# Patient Record
Sex: Male | Born: 1945
Health system: Southern US, Community
[De-identification: ages and names within clinical notes are randomized; demographics above are authoritative.]

## PROBLEM LIST (undated history)

## (undated) DIAGNOSIS — Z9289 Personal history of other medical treatment: Secondary | ICD-10-CM

## (undated) DIAGNOSIS — G4733 Obstructive sleep apnea (adult) (pediatric): Secondary | ICD-10-CM

## (undated) DIAGNOSIS — F329 Major depressive disorder, single episode, unspecified: Secondary | ICD-10-CM

## (undated) DIAGNOSIS — H409 Unspecified glaucoma: Secondary | ICD-10-CM

## (undated) DIAGNOSIS — N200 Calculus of kidney: Secondary | ICD-10-CM

## (undated) DIAGNOSIS — J189 Pneumonia, unspecified organism: Secondary | ICD-10-CM

## (undated) DIAGNOSIS — K625 Hemorrhage of anus and rectum: Secondary | ICD-10-CM

## (undated) DIAGNOSIS — F32A Depression, unspecified: Secondary | ICD-10-CM

## (undated) DIAGNOSIS — W19XXXA Unspecified fall, initial encounter: Secondary | ICD-10-CM

## (undated) DIAGNOSIS — F0390 Unspecified dementia without behavioral disturbance: Secondary | ICD-10-CM

## (undated) DIAGNOSIS — Z993 Dependence on wheelchair: Secondary | ICD-10-CM

## (undated) DIAGNOSIS — R296 Repeated falls: Secondary | ICD-10-CM

## (undated) DIAGNOSIS — J45909 Unspecified asthma, uncomplicated: Secondary | ICD-10-CM

## (undated) DIAGNOSIS — I2510 Atherosclerotic heart disease of native coronary artery without angina pectoris: Secondary | ICD-10-CM

## (undated) DIAGNOSIS — Z8701 Personal history of pneumonia (recurrent): Secondary | ICD-10-CM

## (undated) DIAGNOSIS — R413 Other amnesia: Secondary | ICD-10-CM

## (undated) DIAGNOSIS — E785 Hyperlipidemia, unspecified: Secondary | ICD-10-CM

## (undated) DIAGNOSIS — M549 Dorsalgia, unspecified: Secondary | ICD-10-CM

## (undated) DIAGNOSIS — Z87442 Personal history of urinary calculi: Secondary | ICD-10-CM

## (undated) DIAGNOSIS — E538 Deficiency of other specified B group vitamins: Secondary | ICD-10-CM

## (undated) DIAGNOSIS — C61 Malignant neoplasm of prostate: Secondary | ICD-10-CM

## (undated) DIAGNOSIS — Z8679 Personal history of other diseases of the circulatory system: Secondary | ICD-10-CM

## (undated) DIAGNOSIS — M1990 Unspecified osteoarthritis, unspecified site: Secondary | ICD-10-CM

## (undated) DIAGNOSIS — K219 Gastro-esophageal reflux disease without esophagitis: Secondary | ICD-10-CM

## (undated) DIAGNOSIS — G8929 Other chronic pain: Secondary | ICD-10-CM

## (undated) DIAGNOSIS — F4310 Post-traumatic stress disorder, unspecified: Secondary | ICD-10-CM

## (undated) DIAGNOSIS — I10 Essential (primary) hypertension: Secondary | ICD-10-CM

## (undated) DIAGNOSIS — R51 Headache: Secondary | ICD-10-CM

## (undated) HISTORY — DX: Calculus of kidney: N20.0

## (undated) HISTORY — DX: Unspecified asthma, uncomplicated: J45.909

## (undated) HISTORY — PX: FRACTURE SURGERY: SHX138

## (undated) HISTORY — PX: SHOULDER SURGERY: SHX246

## (undated) HISTORY — DX: Hyperlipidemia, unspecified: E78.5

## (undated) HISTORY — PX: BILATERAL KNEE ARTHROSCOPY: SUR91

## (undated) HISTORY — DX: Unspecified fall, initial encounter: W19.XXXA

## (undated) HISTORY — DX: Deficiency of other specified B group vitamins: E53.8

## (undated) HISTORY — DX: Repeated falls: R29.6

## (undated) HISTORY — DX: Hemorrhage of anus and rectum: K62.5

## (undated) HISTORY — DX: Atherosclerotic heart disease of native coronary artery without angina pectoris: I25.10

## (undated) HISTORY — DX: Obstructive sleep apnea (adult) (pediatric): G47.33

## (undated) HISTORY — DX: Post-traumatic stress disorder, unspecified: F43.10

## (undated) HISTORY — DX: Other amnesia: R41.3

## (undated) HISTORY — DX: Personal history of pneumonia (recurrent): Z87.01

## (undated) HISTORY — DX: Headache: R51

## (undated) HISTORY — PX: FACIAL COSMETIC SURGERY: SHX629

## (undated) HISTORY — PX: OTHER SURGICAL HISTORY: SHX169

## (undated) HISTORY — DX: Essential (primary) hypertension: I10

## (undated) HISTORY — DX: Personal history of other diseases of the circulatory system: Z86.79

---

## 1998-04-25 ENCOUNTER — Ambulatory Visit (HOSPITAL_COMMUNITY): Admission: RE | Admit: 1998-04-25 | Discharge: 1998-04-25 | Payer: Self-pay | Admitting: *Deleted

## 1998-06-09 ENCOUNTER — Ambulatory Visit (HOSPITAL_COMMUNITY): Admission: RE | Admit: 1998-06-09 | Discharge: 1998-06-09 | Payer: Self-pay | Admitting: Neurological Surgery

## 1998-06-10 ENCOUNTER — Inpatient Hospital Stay (HOSPITAL_COMMUNITY): Admission: RE | Admit: 1998-06-10 | Discharge: 1998-06-13 | Payer: Self-pay | Admitting: Neurological Surgery

## 1999-06-01 ENCOUNTER — Ambulatory Visit (HOSPITAL_BASED_OUTPATIENT_CLINIC_OR_DEPARTMENT_OTHER): Admission: RE | Admit: 1999-06-01 | Discharge: 1999-06-01 | Payer: Self-pay | Admitting: Orthopedic Surgery

## 2000-09-16 ENCOUNTER — Encounter: Admission: RE | Admit: 2000-09-16 | Discharge: 2000-09-16 | Payer: Self-pay | Admitting: Neurological Surgery

## 2000-09-16 ENCOUNTER — Encounter: Payer: Self-pay | Admitting: Neurological Surgery

## 2000-09-30 ENCOUNTER — Ambulatory Visit (HOSPITAL_COMMUNITY): Admission: RE | Admit: 2000-09-30 | Discharge: 2000-09-30 | Payer: Self-pay | Admitting: Neurological Surgery

## 2000-09-30 ENCOUNTER — Encounter: Payer: Self-pay | Admitting: Neurological Surgery

## 2000-11-23 ENCOUNTER — Encounter: Admission: RE | Admit: 2000-11-23 | Discharge: 2000-11-23 | Payer: Self-pay | Admitting: Neurological Surgery

## 2000-11-23 ENCOUNTER — Encounter: Payer: Self-pay | Admitting: Neurological Surgery

## 2001-04-23 ENCOUNTER — Ambulatory Visit (HOSPITAL_COMMUNITY): Admission: RE | Admit: 2001-04-23 | Discharge: 2001-04-23 | Payer: Self-pay | Admitting: Neurological Surgery

## 2001-04-23 ENCOUNTER — Encounter: Payer: Self-pay | Admitting: Neurological Surgery

## 2001-06-19 ENCOUNTER — Emergency Department (HOSPITAL_COMMUNITY): Admission: EM | Admit: 2001-06-19 | Discharge: 2001-06-19 | Payer: Self-pay | Admitting: Emergency Medicine

## 2001-06-19 ENCOUNTER — Encounter: Payer: Self-pay | Admitting: Emergency Medicine

## 2001-06-30 ENCOUNTER — Ambulatory Visit: Admission: RE | Admit: 2001-06-30 | Discharge: 2001-06-30 | Payer: Self-pay | Admitting: Otolaryngology

## 2001-11-30 ENCOUNTER — Ambulatory Visit (HOSPITAL_COMMUNITY): Admission: RE | Admit: 2001-11-30 | Discharge: 2001-11-30 | Payer: Self-pay | Admitting: Internal Medicine

## 2001-12-19 ENCOUNTER — Encounter: Payer: Self-pay | Admitting: Internal Medicine

## 2001-12-19 ENCOUNTER — Ambulatory Visit (HOSPITAL_COMMUNITY): Admission: RE | Admit: 2001-12-19 | Discharge: 2001-12-19 | Payer: Self-pay | Admitting: Internal Medicine

## 2002-06-21 ENCOUNTER — Ambulatory Visit (HOSPITAL_COMMUNITY): Admission: RE | Admit: 2002-06-21 | Discharge: 2002-06-21 | Payer: Self-pay | Admitting: Internal Medicine

## 2002-06-21 HISTORY — PX: ESOPHAGOGASTRODUODENOSCOPY: SHX1529

## 2003-02-27 ENCOUNTER — Encounter: Payer: Self-pay | Admitting: Neurological Surgery

## 2003-02-27 ENCOUNTER — Ambulatory Visit (HOSPITAL_COMMUNITY): Admission: RE | Admit: 2003-02-27 | Discharge: 2003-02-27 | Payer: Self-pay | Admitting: Neurological Surgery

## 2003-03-01 ENCOUNTER — Ambulatory Visit (HOSPITAL_COMMUNITY): Admission: RE | Admit: 2003-03-01 | Discharge: 2003-03-02 | Payer: Self-pay | Admitting: Neurological Surgery

## 2003-03-01 ENCOUNTER — Encounter: Payer: Self-pay | Admitting: Neurological Surgery

## 2003-04-18 ENCOUNTER — Ambulatory Visit (HOSPITAL_COMMUNITY): Admission: RE | Admit: 2003-04-18 | Discharge: 2003-04-18 | Payer: Self-pay | Admitting: Neurological Surgery

## 2003-04-18 ENCOUNTER — Encounter: Payer: Self-pay | Admitting: Neurological Surgery

## 2003-10-21 ENCOUNTER — Other Ambulatory Visit: Admission: RE | Admit: 2003-10-21 | Discharge: 2003-10-21 | Payer: Self-pay | Admitting: Dermatology

## 2003-12-17 ENCOUNTER — Ambulatory Visit (HOSPITAL_COMMUNITY): Admission: RE | Admit: 2003-12-17 | Discharge: 2003-12-17 | Payer: Self-pay | Admitting: Internal Medicine

## 2003-12-17 HISTORY — PX: COLONOSCOPY: SHX174

## 2004-09-08 ENCOUNTER — Ambulatory Visit (HOSPITAL_COMMUNITY): Admission: RE | Admit: 2004-09-08 | Discharge: 2004-09-08 | Payer: Self-pay | Admitting: Internal Medicine

## 2004-09-09 ENCOUNTER — Encounter (HOSPITAL_COMMUNITY): Admission: RE | Admit: 2004-09-09 | Discharge: 2004-09-10 | Payer: Self-pay | Admitting: Internal Medicine

## 2005-05-19 ENCOUNTER — Ambulatory Visit: Payer: Self-pay | Admitting: Cardiology

## 2005-05-19 ENCOUNTER — Encounter: Payer: Self-pay | Admitting: Emergency Medicine

## 2005-05-19 ENCOUNTER — Inpatient Hospital Stay (HOSPITAL_COMMUNITY): Admission: AD | Admit: 2005-05-19 | Discharge: 2005-05-20 | Payer: Self-pay | Admitting: Cardiology

## 2005-05-25 ENCOUNTER — Ambulatory Visit: Payer: Self-pay | Admitting: Cardiology

## 2005-05-25 ENCOUNTER — Ambulatory Visit: Payer: Self-pay

## 2005-09-20 ENCOUNTER — Ambulatory Visit (HOSPITAL_COMMUNITY): Admission: RE | Admit: 2005-09-20 | Discharge: 2005-09-20 | Payer: Self-pay | Admitting: Internal Medicine

## 2005-09-22 ENCOUNTER — Ambulatory Visit: Payer: Self-pay | Admitting: Cardiology

## 2005-10-19 ENCOUNTER — Ambulatory Visit (HOSPITAL_COMMUNITY): Admission: RE | Admit: 2005-10-19 | Discharge: 2005-10-19 | Payer: Self-pay | Admitting: Internal Medicine

## 2005-11-10 ENCOUNTER — Ambulatory Visit (HOSPITAL_COMMUNITY): Admission: RE | Admit: 2005-11-10 | Discharge: 2005-11-10 | Payer: Self-pay | Admitting: Otolaryngology

## 2005-12-03 ENCOUNTER — Ambulatory Visit (HOSPITAL_COMMUNITY): Admission: RE | Admit: 2005-12-03 | Discharge: 2005-12-03 | Payer: Self-pay | Admitting: Otolaryngology

## 2005-12-03 ENCOUNTER — Encounter (INDEPENDENT_AMBULATORY_CARE_PROVIDER_SITE_OTHER): Payer: Self-pay | Admitting: *Deleted

## 2006-05-19 ENCOUNTER — Ambulatory Visit (HOSPITAL_COMMUNITY): Admission: RE | Admit: 2006-05-19 | Discharge: 2006-05-19 | Payer: Self-pay | Admitting: Internal Medicine

## 2006-07-29 ENCOUNTER — Ambulatory Visit (HOSPITAL_COMMUNITY): Admission: RE | Admit: 2006-07-29 | Discharge: 2006-07-29 | Payer: Self-pay | Admitting: Neurological Surgery

## 2006-09-13 ENCOUNTER — Inpatient Hospital Stay (HOSPITAL_COMMUNITY): Admission: RE | Admit: 2006-09-13 | Discharge: 2006-09-15 | Payer: Self-pay | Admitting: Neurological Surgery

## 2006-09-17 ENCOUNTER — Emergency Department (HOSPITAL_COMMUNITY): Admission: EM | Admit: 2006-09-17 | Discharge: 2006-09-17 | Payer: Self-pay | Admitting: Emergency Medicine

## 2006-11-12 ENCOUNTER — Emergency Department (HOSPITAL_COMMUNITY): Admission: EM | Admit: 2006-11-12 | Discharge: 2006-11-12 | Payer: Self-pay | Admitting: Emergency Medicine

## 2006-12-08 ENCOUNTER — Ambulatory Visit (HOSPITAL_COMMUNITY): Admission: RE | Admit: 2006-12-08 | Discharge: 2006-12-08 | Payer: Self-pay | Admitting: Internal Medicine

## 2006-12-19 ENCOUNTER — Ambulatory Visit: Payer: Self-pay | Admitting: Cardiology

## 2006-12-19 ENCOUNTER — Observation Stay (HOSPITAL_COMMUNITY): Admission: EM | Admit: 2006-12-19 | Discharge: 2006-12-20 | Payer: Self-pay | Admitting: Emergency Medicine

## 2006-12-22 ENCOUNTER — Ambulatory Visit: Payer: Self-pay | Admitting: Cardiology

## 2006-12-22 ENCOUNTER — Ambulatory Visit: Payer: Self-pay

## 2006-12-22 ENCOUNTER — Encounter: Payer: Self-pay | Admitting: Cardiology

## 2007-01-09 ENCOUNTER — Ambulatory Visit: Payer: Self-pay | Admitting: Cardiology

## 2008-01-01 ENCOUNTER — Ambulatory Visit (HOSPITAL_COMMUNITY): Admission: RE | Admit: 2008-01-01 | Discharge: 2008-01-01 | Payer: Self-pay | Admitting: Neurological Surgery

## 2008-04-15 ENCOUNTER — Encounter: Admission: RE | Admit: 2008-04-15 | Discharge: 2008-04-15 | Payer: Self-pay | Admitting: Neurological Surgery

## 2008-04-29 ENCOUNTER — Ambulatory Visit: Payer: Self-pay | Admitting: Cardiology

## 2008-05-03 ENCOUNTER — Ambulatory Visit: Payer: Self-pay

## 2008-05-03 ENCOUNTER — Encounter: Payer: Self-pay | Admitting: Cardiology

## 2008-05-06 ENCOUNTER — Ambulatory Visit: Payer: Self-pay

## 2008-10-01 ENCOUNTER — Ambulatory Visit: Payer: Self-pay | Admitting: Internal Medicine

## 2008-10-15 ENCOUNTER — Ambulatory Visit: Payer: Self-pay | Admitting: Internal Medicine

## 2008-10-15 ENCOUNTER — Ambulatory Visit (HOSPITAL_COMMUNITY): Admission: RE | Admit: 2008-10-15 | Discharge: 2008-10-15 | Payer: Self-pay | Admitting: Internal Medicine

## 2008-10-15 ENCOUNTER — Encounter: Payer: Self-pay | Admitting: Internal Medicine

## 2008-10-15 HISTORY — PX: COLONOSCOPY: SHX174

## 2008-10-15 HISTORY — PX: ESOPHAGOGASTRODUODENOSCOPY: SHX1529

## 2009-01-27 ENCOUNTER — Encounter: Payer: Self-pay | Admitting: Cardiology

## 2009-02-05 ENCOUNTER — Encounter: Payer: Self-pay | Admitting: Cardiology

## 2009-08-11 DIAGNOSIS — R0609 Other forms of dyspnea: Secondary | ICD-10-CM | POA: Insufficient documentation

## 2009-08-11 DIAGNOSIS — R0989 Other specified symptoms and signs involving the circulatory and respiratory systems: Secondary | ICD-10-CM

## 2009-08-11 DIAGNOSIS — E876 Hypokalemia: Secondary | ICD-10-CM | POA: Insufficient documentation

## 2009-08-11 DIAGNOSIS — K625 Hemorrhage of anus and rectum: Secondary | ICD-10-CM | POA: Insufficient documentation

## 2009-08-12 ENCOUNTER — Ambulatory Visit: Payer: Self-pay | Admitting: Cardiology

## 2009-10-13 ENCOUNTER — Ambulatory Visit (HOSPITAL_COMMUNITY): Admission: RE | Admit: 2009-10-13 | Discharge: 2009-10-13 | Payer: Self-pay | Admitting: Internal Medicine

## 2009-10-13 ENCOUNTER — Encounter: Payer: Self-pay | Admitting: Orthopedic Surgery

## 2009-10-15 ENCOUNTER — Ambulatory Visit (HOSPITAL_COMMUNITY): Admission: RE | Admit: 2009-10-15 | Discharge: 2009-10-15 | Payer: Self-pay | Admitting: Internal Medicine

## 2009-10-31 DIAGNOSIS — F1010 Alcohol abuse, uncomplicated: Secondary | ICD-10-CM | POA: Insufficient documentation

## 2009-10-31 DIAGNOSIS — G4733 Obstructive sleep apnea (adult) (pediatric): Secondary | ICD-10-CM | POA: Insufficient documentation

## 2009-10-31 DIAGNOSIS — K219 Gastro-esophageal reflux disease without esophagitis: Secondary | ICD-10-CM | POA: Insufficient documentation

## 2009-10-31 DIAGNOSIS — I10 Essential (primary) hypertension: Secondary | ICD-10-CM | POA: Insufficient documentation

## 2009-10-31 DIAGNOSIS — E785 Hyperlipidemia, unspecified: Secondary | ICD-10-CM | POA: Insufficient documentation

## 2009-10-31 DIAGNOSIS — F17200 Nicotine dependence, unspecified, uncomplicated: Secondary | ICD-10-CM | POA: Insufficient documentation

## 2009-10-31 DIAGNOSIS — E7800 Pure hypercholesterolemia, unspecified: Secondary | ICD-10-CM | POA: Insufficient documentation

## 2009-11-14 ENCOUNTER — Ambulatory Visit (HOSPITAL_COMMUNITY): Admission: RE | Admit: 2009-11-14 | Discharge: 2009-11-14 | Payer: Self-pay | Admitting: Internal Medicine

## 2009-11-14 ENCOUNTER — Encounter: Payer: Self-pay | Admitting: Orthopedic Surgery

## 2009-12-08 ENCOUNTER — Ambulatory Visit: Payer: Self-pay | Admitting: Orthopedic Surgery

## 2009-12-08 DIAGNOSIS — M84369A Stress fracture, unspecified tibia and fibula, initial encounter for fracture: Secondary | ICD-10-CM | POA: Insufficient documentation

## 2009-12-16 ENCOUNTER — Telehealth: Payer: Self-pay | Admitting: Orthopedic Surgery

## 2009-12-17 ENCOUNTER — Encounter: Payer: Self-pay | Admitting: Orthopedic Surgery

## 2010-02-09 ENCOUNTER — Ambulatory Visit: Payer: Self-pay | Admitting: Orthopedic Surgery

## 2010-02-09 DIAGNOSIS — M1710 Unilateral primary osteoarthritis, unspecified knee: Secondary | ICD-10-CM

## 2010-02-09 DIAGNOSIS — IMO0002 Reserved for concepts with insufficient information to code with codable children: Secondary | ICD-10-CM | POA: Insufficient documentation

## 2010-03-23 ENCOUNTER — Ambulatory Visit: Payer: Self-pay | Admitting: Orthopedic Surgery

## 2010-05-04 ENCOUNTER — Telehealth: Payer: Self-pay | Admitting: Orthopedic Surgery

## 2010-07-14 ENCOUNTER — Encounter: Payer: Self-pay | Admitting: Cardiology

## 2010-07-17 DIAGNOSIS — I2510 Atherosclerotic heart disease of native coronary artery without angina pectoris: Secondary | ICD-10-CM | POA: Insufficient documentation

## 2010-07-22 ENCOUNTER — Ambulatory Visit: Payer: Self-pay | Admitting: Cardiology

## 2010-07-22 DIAGNOSIS — E663 Overweight: Secondary | ICD-10-CM | POA: Insufficient documentation

## 2010-07-22 DIAGNOSIS — R55 Syncope and collapse: Secondary | ICD-10-CM | POA: Insufficient documentation

## 2011-01-19 NOTE — Letter (Signed)
Summary: [facility]   [facility]   Imported By: Roderic Ovens 07/27/2010 16:04:55  _____________________________________________________________________  External Attachment:    Type:   Image     Comment:   External Document

## 2011-01-19 NOTE — Assessment & Plan Note (Signed)
Summary: left tib plateau contusion/xr and mri ap/fed bcbs/fagan/bsf   Visit Type:  Initial Consult Referring Provider:  Dr Ouida Sills  Primary Provider:  Carylon Perches  CC:  left knee pain.  History of Present Illness: I saw Cody Velez in the office today for an initial visit.  He is a 65 years old man with the complaint of:  LEFT knee pain x6 weeks.  Patient reports he fell at home, landed on his back started having some soreness in his LEFT knee. Then, he noticed increasing pain with his primary care physician. The knee was aspirated approximately 30 cc of blood. He eventually had an MRI and an x-ray, which showed 3 compartment arthritis and a stress reaction or stress-type appearance to the bone. The tibial plateau on the medial side. He's been using oxycodone for pain 5 mg, which he was taking for his back. It doesn't really help. His back or his knee.  He's had 5 back surgeries to a back fusion that is currently being managed with oxycodone and injections. He will need another surgery.  Right now, has severe medial knee pain, which is nonradiating, primarily over the medial side, and underneath the patella. Associated with swelling, which is returned in the last week or so. He is using a cane.  review of systems the patient was reflux, hematuria, depression, denies weight loss, chest pain, shortness of breath, nausea, headache, thyroid disease, eczema, poor vision, cataracts, seasonal allergies, lymph node disease, or sinusitis.  No major medical problems surgery on his knee. Lumbar disc surgery x5 with recent spinal, fusion, he's had some facial surgery.  Physician Dr. Ouida Sills.  Medications are recorded in the list, and have been reviewed.  Family history heart disease, cancer, kidney disease.  Social history is married. He works in Insurance account manager. He does not smoke. He drinks wine caffeine, one to 2 cups per day. I scraped completed, some college.  Physical exam see. Physical exam section       Allergies: 1)  ! * Phenylephirednirne  Physical Exam  Additional Exam:  examination vital signs are stable as recorded.  Large male in reasonable shape oriented x3. Mood and affect pleasant.  He ambulates with a cane in the RIGHT hand limping.  cardiovascular exam in the lower extremity. His pulses temperature normal, no edema.  No lymphadenopathy in the lower extremities.  Coordination balance normal.   LEFT knee. Skin is normal. Strength is normal. Stability is normal. Range of motion is relatively normal. On inspection there is a large joint effusion. His medial tenderness.  RIGHT leg. Alignment is normal. No swelling.      Impression & Recommendations:  Problem # 1:  STRESS FRACTURE, TIBIA (ICD-733.93) Assessment New  stress reaction seen on MRI is described as a stress fracture. Recommend protected weightbearing in a hinge brace with a cane.  MRI and x-rays reviewed from the hospital. X-rays of the knee show arthritis does not seem to be as bad as noted in seen on MRI.  Recommend increase oxycodone, dose to 7.5 mg q.4 p.r.n. pain. Follow up visit 8 weeks  Orders: New Patient Level III (11914)  Medications Added to Medication List This Visit: 1)  Oxycodone-acetaminophen 7.5-325 Mg Tabs (Oxycodone-acetaminophen) .Marland Kitchen.. 1 by mouth q 4 as needed pain  Patient Instructions: 1)  8 week followup. Clinical exam only  2)  wear a brace for walking Prescriptions: OXYCODONE-ACETAMINOPHEN 7.5-325 MG TABS (OXYCODONE-ACETAMINOPHEN) 1 by mouth q 4 as needed pain  #100 x 0   Entered  and Authorized by:   Fuller Canada MD   Signed by:   Fuller Canada MD on 12/08/2009   Method used:   Print then Give to Patient   RxID:   [id number]

## 2011-01-19 NOTE — Progress Notes (Signed)
Summary: call from patient : Letter requested  Phone Note Call from Patient   Caller: Patient Summary of Call: Patient called 1:25pm today to request letter for employer, [employer] of [employer], to the effect that he needs to wear brace X8 wks due to medical reasons; mention limitation re: bending knee. Initial call taken by: Cammie Sickle,  December 16, 2009 6:54 PM  Follow-up for Phone Call        Patient picked up letter 12/17/09. Follow-up by: Cammie Sickle,  December 18, 2009 8:20 AM

## 2011-01-19 NOTE — Assessment & Plan Note (Signed)
Summary: F1Y/ANAS  Medications Added FLUOXETINE HCL 20 MG CAPS (FLUOXETINE HCL) 1 cap once daily        Visit Type:  1 yr f/u Referring Provider:  Dr Ouida Sills  Primary Provider:  Carylon Perches  CC:  pt states last October he was working in the yard and had a syncope episode said he went to Dr. Alonza Smoker office and said they did not know why he passed out...pt states that he had been having back pain and had been taking some percocet he also states he was sweaty and nausea...states he hurt his left knee at that time....sob but says he has this all the time.  History of Present Illness: Mr Paternoster comes in today for evaluation and management of his coronary artery disease.  He is having no angina or ischemic symptoms. However, he did have unexplained syncope in October 2010. He had just eaten breakfast and subsequently became nauseated. He broke out in a sweat and found himself lying in the yard. He avoided injury. There were no symptoms prior to this to suggest any cardiac etiology. There's been no recurrence.  He has lost about 6 pounds. He has had a lot of problems with his back and his left knee. Dr. Danielle Dess and Dr. Ouida Sills have encouraged him to lose weight.  He has a hard time lying flat without getting back spasms.  He is compliant with medications. He says his last cholesterol with Dr. Ouida Sills was 123. His triglycerides are still elevated.  He's had some edema in his legs he denies any PND orthopnea.  Clinical Reports Reviewed:  Cardiac Cath:  05/20/2005: Cardiac Cath Findings:    IMPRESSION:  1.  Normal left ventricular systolic function.  2.  Moderate two-vessel coronary disease of borderline severity. Overall it      does not appear to be angiographically significant.   PLAN:  Would recommend a stress nuclear study, to rule out ischemia in the  distribution of the LAD or circumflex arteries.   MWP/MEDQ  D:  05/20/2005  T:  05/20/2005  Job:  [id number]   cc:   Kingsley Callander. Ouida Sills, MD  [address]  [place]  [place] [address]  Fax: [phone]  Nuclear Study:  05/03/2008:  Excerise capacity: Adenosine study with no exercise  Blood Pressure response:  Clinical symptoms:  ECG impression: No significant ST segment change suggestive of ischemia  Overall impression: There is no sign of scar or ischemia.  Olga Millers, MD   05/25/2005:  Interpretation: Clinically negative for ischemia and electrocardiographically negative for ischemia. Perfusion data demonstrated inferoseptal and anteroseptal defect, both of which were nonreversible. There was no definite ischemia seen on this study.  Learta Codding, MD   Current Medications (verified): 1)  Cozaar 100 Mg Tabs (Losartan Potassium) .Marland Kitchen.. 1 Tab Once Daily 2)  Nexium 40 Mg Cpdr (Esomeprazole Magnesium) .Marland Kitchen.. 1 Tab Once Daily 3)  Aspirin 81 Mg Tbec (Aspirin) .... Take One Tablet By Mouth Daily 4)  Crestor 5 Mg Tabs (Rosuvastatin Calcium) .Marland Kitchen.. 1 Tab At Bedtime 5)  Optivar 0.05 % Soln (Azelastine Hcl) .... Use As Directed 6)  Restasis 0.05 % Emul (Cyclosporine) .... Use As Directed 7)  Multivitamins   Tabs (Multiple Vitamin) .Marland Kitchen.. 1 Tab Once Daily 8)  Fish Oil 1000 Mg Caps (Omega-3 Fatty Acids) .Marland Kitchen.. 1 Cap Once Daily 9)  Chlorthalidone 25 Mg Tabs (Chlorthalidone) .Marland Kitchen.. 1 Tab Once Daily 10)  Coenzyme Q10 100 Mg Caps (Coenzyme Q10) .Marland Kitchen.. 1 Cap Once Daily  11)  Chlorthalidone 25 Mg Tabs (Chlorthalidone) .... Once Daily 12)  Oxycodone-Acetaminophen 7.5-325 Mg Tabs (Oxycodone-Acetaminophen) .Marland Kitchen.. 1 By Mouth Q 4 As Needed Pain 13)  Fluoxetine Hcl 20 Mg Caps (Fluoxetine Hcl) .Marland Kitchen.. 1 Cap Once Daily  Allergies: 1)  ! * Phenylephirednirne  Past History:  Past Medical History: Last updated: 07/17/2010 CAD, native vessel DYSPNEA ON EXERTION (ICD-786.09) HYPOKALEMIA (ICD-276.8) RECTAL BLEEDING (ICD-569.3)  Past Surgical History: Last updated: 04-Sep-2009 back surgeries x5 Knee Arthroscopy bilateral  facial surgery left wrist  surgery  Family History: Last updated: 09/04/2009  Mother deceased at age 57 due to perforated colon at   time of colonoscopy which is being done to evaluate metastatic cancer   possibly colon cancer per family.  Father deceased at age 59 with lung   cancer.  Mother according to the patient's prior family history had a   history of polycythemia.      Social History: Last updated: 09/04/2009  He is married, he has three daughters.  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  He quit smoking in 1970.   History of chewing tobacco.  He occasionally consumes alcohol.   Review of Systems       negative other than history of present illness  Vital Signs:  Patient profile:   65 year old male Height:      75 inches Weight:      308 pounds BMI:     38.64 Pulse rate:   69 / minute Pulse rhythm:   irregular BP sitting:   136 / 80  (left arm) Cuff size:   large  Vitals Entered By: Danielle Rankin, CMA (July 22, 2010 3:21 PM)  Physical Exam  General:  obese.  no acute distress Head:  normocephalic and atraumatic Eyes:  PERRLA/EOM intact; conjunctiva and lids normal. Neck:  Neck supple, no JVD. No masses, thyromegaly or abnormal cervical nodes. Chest Virjean Boman:  no deformities or breast masses noted Lungs:  Clear bilaterally to auscultation and percussion. Heart:  PMI nondisplaced, normal S1-S2, no murmur or gallop. Regular rate and rhythm Msk:  decreased ROM.   Pulses:  pulses normal in all 4 extremities Extremities:  No clubbing or cyanosis. 1-2+ pitting edema Neurologic:  Alert and oriented x 3. Skin:  Intact without lesions or rashes. Psych:  Normal affect.   EKG  Procedure date:  07/22/2010  Findings:      normal sinus rhythm, left axis deviation, and no acute changes.  Impression & Recommendations:  Problem # 1:  CAD, NATIVE VESSEL (ICD-414.01) Assessment Unchanged Stable. Continue medical therapy. Symptoms of angina reviewed. He really can't lay down to  have a Myoview at this time. His updated medication list for this problem includes:    Aspirin 81 Mg Tbec (Aspirin) .Marland Kitchen... Take one tablet by mouth daily  Orders: EKG w/ Interpretation (93000)  His updated medication list for this problem includes:    Aspirin 81 Mg Tbec (Aspirin) .Marland Kitchen... Take one tablet by mouth daily  Problem # 2:  HYPERCHOLESTEROLEMIA (ICD-272.0) I have asked him to avoid carbs and lose more weight to get his triglycerides down. The following medications were removed from the medication list:    Simvastatin 40 Mg Tabs (Simvastatin) ..... Once daily His updated medication list for this problem includes:    Crestor 5 Mg Tabs (Rosuvastatin calcium) .Marland Kitchen... 1 tab at bedtime  The following medications were removed from the medication list:    Simvastatin 40 Mg Tabs (Simvastatin) .Marland KitchenMarland KitchenMarland KitchenMarland Kitchen  Once daily His updated medication list for this problem includes:    Crestor 5 Mg Tabs (Rosuvastatin calcium) .Marland Kitchen... 1 tab at bedtime  Problem # 3:  HYPERTENSION (ICD-401.9)  His updated medication list for this problem includes:    Cozaar 100 Mg Tabs (Losartan potassium) .Marland Kitchen... 1 tab once daily    Aspirin 81 Mg Tbec (Aspirin) .Marland Kitchen... Take one tablet by mouth daily    Chlorthalidone 25 Mg Tabs (Chlorthalidone) .Marland Kitchen... 1 tab once daily    Chlorthalidone 25 Mg Tabs (Chlorthalidone) ..... Once daily  Orders: EKG w/ Interpretation (93000)  His updated medication list for this problem includes:    Cozaar 100 Mg Tabs (Losartan potassium) .Marland Kitchen... 1 tab once daily    Aspirin 81 Mg Tbec (Aspirin) .Marland Kitchen... Take one tablet by mouth daily    Chlorthalidone 25 Mg Tabs (Chlorthalidone) .Marland Kitchen... 1 tab once daily    Chlorthalidone 25 Mg Tabs (Chlorthalidone) ..... Once daily  Problem # 4:  OVERWEIGHT/OBESITY (ICD-278.02) Assessment: Improved  Problem # 5:  SYNCOPE AND COLLAPSE (ICD-780.2) This was probably vasovagal. No recurrence. Reviewed with patient. His updated medication list for this problem includes:     Aspirin 81 Mg Tbec (Aspirin) .Marland Kitchen... Take one tablet by mouth daily  Patient Instructions: 1)  Your physician recommends that you schedule a follow-up appointment in: 1 year with Dr. Daleen Squibb 2)  Your physician recommends that you continue on your current medications as directed. Please refer to the Current Medication list given to you today.

## 2011-01-19 NOTE — Letter (Signed)
Summary: Historic Patient File  Historic Patient File   Imported By: Elvera Maria 12/10/2009 09:28:05  _____________________________________________________________________  External Attachment:    Type:   Image     Comment:   history form

## 2011-01-19 NOTE — Assessment & Plan Note (Signed)
Summary: 6 WK RE-CK FOL'G BRACE/BCBS/CAF   Visit Type:  Follow-up Referring Provider:  Dr Ouida Sills  Primary Provider:  Carylon Perches  CC:  left knee pain.  History of Present Illness:       Patient reports he fell at home, landed on his back started having some soreness in his LEFT knee. Then, he noticed increasing pain with his primary care physician. The knee was aspirated approximately 30 cc of blood. He eventually had an MRI and an x-ray, which showed 3 compartment arthritis and a stress reaction or stress-type appearance to the bonein the  tibial plateau on the medial side.    He's had 5 back surgeries to a back fusion that is currently being managed with oxycodone and injections. He will need another surgery.  Medications: Aspirin, Oxycodone.  He pain has decreased but he still has pain over the medial joint line and had some anterior knee pain today. the knee feels like it wants to give out at times   IMAGING: 10/2009: 3 compartment OA with medial posterior tibial edema or stress reaction             Allergies: 1)  ! * Phenylephirednirne  Physical Exam  Additional Exam:     Large male in reasonable shape oriented x3. Mood and affect pleasant.  He ambulates with a cane in the RIGHT hand    cardiovascular exam in the lower extremity. His pulses temperature normal, no edema.     Coordination balance normal.   LEFT knee. Skin is normal. Strength is normal. Stability is normal. Range of motion is relatively normal.           Impression & Recommendations:  Problem # 1:  KNEE, ARTHRITIS, DEGEN./OSTEO (ICD-715.96) Assessment Improved  His updated medication list for this problem includes:    Aspirin 81 Mg Tbec (Aspirin) .Marland Kitchen... Take one tablet by mouth daily    Oxycodone-acetaminophen 7.5-325 Mg Tabs (Oxycodone-acetaminophen) .Marland Kitchen... 1 by mouth q 4 as needed pain  Orders: Est. Patient Level II (10175)  Problem # 2:  STRESS FRACTURE, TIBIA  (ICD-733.93) Assessment: Improved  Orders: Est. Patient Level II (10258)  Patient Instructions: 1)  knee exercises for 6 weeks  2)  wear brace for walking  3)  return in 6 weeks  4)  cane as needed

## 2011-01-19 NOTE — Assessment & Plan Note (Signed)
Summary: 8 WK RE-CK FOL'G BRACE/[employer]/CAF   Visit Type:  Follow-up Referring Provider:  Dr Ouida Sills  Primary Provider:  Carylon Perches  CC:  8 week recheck. pain left knee .  History of Present Illness: I saw Cody Velez in the office today for an initial visit.  He is a 65 years old man with the complaint of:  LEFT knee pain, tibia stress fracture.  Patient reports he fell at home, landed on his back started having some soreness in his LEFT knee. Then, he noticed increasing pain with his primary care physician. The knee was aspirated approximately 30 cc of blood. He eventually had an MRI and an x-ray, which showed 3 compartment arthritis and a stress reaction or stress-type appearance to the bonein the  tibial plateau on the medial side.    He's had 5 back surgeries to a back fusion that is currently being managed with oxycodone and injections. He will need another surgery.  Percocet 7.5 for pain.  Post op Brace for walking.  S: No swelling, still some soreness medially.  ROS: back no change   O:GEN: normal, KNEE:  medial tenderness tibia and pes bursa. ROM 125. Knee stable, sensation remains intact over the left knee skin intact, ambulates with cane   A: pain from tibial stress reaction, OA  P: brace 6 more weeks. Pain relief better overall with 7.5 oxycodone    Allergies: 1)  ! * Phenylephirednirne   Impression & Recommendations:  Problem # 1:  STRESS FRACTURE, TIBIA (ICD-733.93) Assessment Improved  Orders: Est. Patient Level III (14782)  Problem # 2:  KNEE, ARTHRITIS, DEGEN./OSTEO (NFA-213.08) Assessment: Comment Only  Underlying OA on MRI   His updated medication list for this problem includes:    Aspirin 81 Mg Tbec (Aspirin) .Marland Kitchen... Take one tablet by mouth daily    Oxycodone-acetaminophen 7.5-325 Mg Tabs (Oxycodone-acetaminophen) .Marland Kitchen... 1 by mouth q 4 as needed pain  Orders: Est. Patient Level III (65784)  Problem # 3:  ANSERINE BURSITIS, LEFT  (ICD-726.61) Assessment: Comment Only  Also seen on MRI   Orders: Est. Patient Level III (69629)  Patient Instructions: 1)  6 weeks f/u continue brace and cane

## 2011-01-19 NOTE — Progress Notes (Signed)
Summary: cancelled appointment; knee feeling better  Phone Note Call from Patient   Caller: Patient Summary of Call: Patient called to cancel his 6 wk fol/up appointment for today. States LT knee is feeling much better.  States will call if needs to schedule another appointment. Initial call taken by: Cammie Sickle,  May 04, 2010 8:42 AM

## 2011-01-27 ENCOUNTER — Encounter: Payer: Self-pay | Admitting: Cardiology

## 2011-01-28 ENCOUNTER — Other Ambulatory Visit (HOSPITAL_COMMUNITY): Payer: Self-pay | Admitting: Neurological Surgery

## 2011-01-28 DIAGNOSIS — M47816 Spondylosis without myelopathy or radiculopathy, lumbar region: Secondary | ICD-10-CM

## 2011-02-15 ENCOUNTER — Ambulatory Visit (HOSPITAL_COMMUNITY)
Admission: RE | Admit: 2011-02-15 | Discharge: 2011-02-15 | Disposition: A | Discharge status: Home / Self Care | Payer: Federal, State, Local not specified - PPO | Source: Ambulatory Visit | Attending: Neurological Surgery | Admitting: Neurological Surgery

## 2011-02-15 ENCOUNTER — Other Ambulatory Visit (HOSPITAL_COMMUNITY): Payer: Self-pay

## 2011-02-15 DIAGNOSIS — M47816 Spondylosis without myelopathy or radiculopathy, lumbar region: Secondary | ICD-10-CM

## 2011-02-15 DIAGNOSIS — M545 Low back pain, unspecified: Secondary | ICD-10-CM | POA: Insufficient documentation

## 2011-02-15 DIAGNOSIS — M48061 Spinal stenosis, lumbar region without neurogenic claudication: Secondary | ICD-10-CM | POA: Insufficient documentation

## 2011-02-15 DIAGNOSIS — Z981 Arthrodesis status: Secondary | ICD-10-CM | POA: Insufficient documentation

## 2011-02-15 MED ORDER — IOHEXOL 180 MG/ML  SOLN: 20.0000 mL | Freq: Once | INTRAMUSCULAR | Status: DC | PRN

## 2011-02-18 DIAGNOSIS — Z8701 Personal history of pneumonia (recurrent): Secondary | ICD-10-CM

## 2011-02-18 HISTORY — DX: Personal history of pneumonia (recurrent): Z87.01

## 2011-02-24 ENCOUNTER — Telehealth (INDEPENDENT_AMBULATORY_CARE_PROVIDER_SITE_OTHER): Payer: Self-pay | Admitting: *Deleted

## 2011-02-24 ENCOUNTER — Encounter (HOSPITAL_COMMUNITY)
Admission: RE | Admit: 2011-02-24 | Discharge: 2011-02-24 | Disposition: A | Discharge status: Home / Self Care | Payer: Federal, State, Local not specified - PPO | Source: Ambulatory Visit | Attending: Neurological Surgery | Admitting: Neurological Surgery

## 2011-02-24 ENCOUNTER — Other Ambulatory Visit (HOSPITAL_COMMUNITY): Payer: Self-pay | Admitting: Neurological Surgery

## 2011-02-24 DIAGNOSIS — M4306 Spondylolysis, lumbar region: Secondary | ICD-10-CM

## 2011-02-24 LAB — CBC
HCT: 43.1 % (ref 39.0–52.0)
Hemoglobin: 14.4 g/dL (ref 13.0–17.0)
MCH: 30.4 pg (ref 26.0–34.0)
MCHC: 33.4 g/dL (ref 30.0–36.0)
MCV: 91.1 fL (ref 78.0–100.0)
Platelets: 316 10*3/uL (ref 150–400)
RBC: 4.73 MIL/uL (ref 4.22–5.81)
RDW: 14.5 % (ref 11.5–15.5)
WBC: 7 10*3/uL (ref 4.0–10.5)

## 2011-02-24 LAB — BASIC METABOLIC PANEL
BUN: 9 mg/dL (ref 6–23)
CO2: 30 mEq/L (ref 19–32)
Calcium: 9.2 mg/dL (ref 8.4–10.5)
Chloride: 100 mEq/L (ref 96–112)
Creatinine, Ser: 1.15 mg/dL (ref 0.4–1.5)
GFR calc Af Amer: >60 mL/min (ref >60)
GFR calc non Af Amer: >60 mL/min (ref >60)
Glucose, Bld: 85 mg/dL (ref 70–99)
Potassium: 3.5 mEq/L (ref 3.5–5.1)
Sodium: 138 mEq/L (ref 135–145)

## 2011-02-24 LAB — TYPE AND SCREEN
ABO/RH(D): O NEG
Antibody Screen: NEGATIVE

## 2011-02-24 LAB — SURGICAL PCR SCREEN
MRSA, PCR: NEGATIVE
Staphylococcus aureus: POSITIVE — AB

## 2011-03-01 ENCOUNTER — Inpatient Hospital Stay (HOSPITAL_COMMUNITY)
Admission: RE | Admit: 2011-03-01 | Discharge: 2011-03-04 | DRG: 756 | Disposition: A | Discharge status: Home Health | Payer: Federal, State, Local not specified - PPO | Source: Ambulatory Visit | Attending: Neurological Surgery | Admitting: Neurological Surgery

## 2011-03-01 ENCOUNTER — Inpatient Hospital Stay (HOSPITAL_COMMUNITY): Payer: Federal, State, Local not specified - PPO

## 2011-03-01 DIAGNOSIS — M2420 Disorder of ligament, unspecified site: Secondary | ICD-10-CM | POA: Diagnosis present

## 2011-03-01 DIAGNOSIS — G9741 Accidental puncture or laceration of dura during a procedure: Secondary | ICD-10-CM | POA: Diagnosis not present

## 2011-03-01 DIAGNOSIS — I2510 Atherosclerotic heart disease of native coronary artery without angina pectoris: Secondary | ICD-10-CM | POA: Diagnosis present

## 2011-03-01 DIAGNOSIS — IMO0002 Reserved for concepts with insufficient information to code with codable children: Secondary | ICD-10-CM | POA: Diagnosis not present

## 2011-03-01 DIAGNOSIS — G4730 Sleep apnea, unspecified: Secondary | ICD-10-CM | POA: Diagnosis present

## 2011-03-01 DIAGNOSIS — Z7982 Long term (current) use of aspirin: Secondary | ICD-10-CM

## 2011-03-01 DIAGNOSIS — E7800 Pure hypercholesterolemia, unspecified: Secondary | ICD-10-CM | POA: Diagnosis present

## 2011-03-01 DIAGNOSIS — Y9210 Unspecified residential institution as the place of occurrence of the external cause: Secondary | ICD-10-CM | POA: Diagnosis not present

## 2011-03-01 DIAGNOSIS — I10 Essential (primary) hypertension: Secondary | ICD-10-CM | POA: Diagnosis present

## 2011-03-01 DIAGNOSIS — Z79899 Other long term (current) drug therapy: Secondary | ICD-10-CM

## 2011-03-01 DIAGNOSIS — M47817 Spondylosis without myelopathy or radiculopathy, lumbosacral region: Principal | ICD-10-CM | POA: Diagnosis present

## 2011-03-01 DIAGNOSIS — Z4589 Encounter for adjustment and management of other implanted devices: Secondary | ICD-10-CM

## 2011-03-01 DIAGNOSIS — M629 Disorder of muscle, unspecified: Secondary | ICD-10-CM | POA: Diagnosis present

## 2011-03-01 LAB — CBC
HCT: 37.2 % — ABNORMAL LOW (ref 39.0–52.0)
Hemoglobin: 12.3 g/dL — ABNORMAL LOW (ref 13.0–17.0)
MCH: 30.1 pg (ref 26.0–34.0)
MCHC: 33.1 g/dL (ref 30.0–36.0)
MCV: 91 fL (ref 78.0–100.0)
Platelets: 258 10*3/uL (ref 150–400)
RBC: 4.09 MIL/uL — ABNORMAL LOW (ref 4.22–5.81)
RDW: 14.6 % (ref 11.5–15.5)
WBC: 14.6 10*3/uL — ABNORMAL HIGH (ref 4.0–10.5)

## 2011-03-02 LAB — CBC
HCT: 32.6 % — ABNORMAL LOW (ref 39.0–52.0)
Hemoglobin: 10.9 g/dL — ABNORMAL LOW (ref 13.0–17.0)
MCH: 30.6 pg (ref 26.0–34.0)
MCHC: 33.4 g/dL (ref 30.0–36.0)
MCV: 91.6 fL (ref 78.0–100.0)
Platelets: 231 10*3/uL (ref 150–400)
RBC: 3.56 MIL/uL — ABNORMAL LOW (ref 4.22–5.81)
RDW: 14.8 % (ref 11.5–15.5)
WBC: 11.1 10*3/uL — ABNORMAL HIGH (ref 4.0–10.5)

## 2011-03-02 LAB — BASIC METABOLIC PANEL
BUN: 12 mg/dL (ref 6–23)
CO2: 27 mEq/L (ref 19–32)
Calcium: 7.7 mg/dL — ABNORMAL LOW (ref 8.4–10.5)
Chloride: 101 mEq/L (ref 96–112)
Creatinine, Ser: 1.05 mg/dL (ref 0.4–1.5)
GFR calc Af Amer: >60 mL/min (ref >60)
GFR calc non Af Amer: >60 mL/min (ref >60)
Glucose, Bld: 145 mg/dL — ABNORMAL HIGH (ref 70–99)
Potassium: 3.6 mEq/L (ref 3.5–5.1)
Sodium: 134 mEq/L — ABNORMAL LOW (ref 135–145)

## 2011-03-02 NOTE — Progress Notes (Signed)
Summary: Records Request   Faxed OV, EKG, Stress & CXR to Amil Amen (per [place]) at [facility] ([phone]). Debby Freiberg  February 24, 2011 2:28 PM

## 2011-03-02 NOTE — Op Note (Signed)
Cody Velez, Cody Velez             ACCOUNT NO.:  [id number]  MEDICAL RECORD NO.:  [id number]           PATIENT TYPE:  I  LOCATION:  3103                         FACILITY:  [facility]  PHYSICIAN:  Stefani Dama, M.D.  DATE OF BIRTH:  1946-06-18  DATE OF PROCEDURE:  03/01/2011 DATE OF DISCHARGE:                              OPERATIVE REPORT   PREOPERATIVE DIAGNOSES: 1. Lumbar spondylosis and stenosis with retrolisthesis, L1-2 and L2-3. 2. Lumbar radiculopathy. 3. Status post arthrodesis L3 to sacrum.  POSTOPERATIVE DIAGNOSES: 1. Lumbar spondylosis and stenosis with retrolisthesis, L1-2 and L2-3. 2. Lumbar radiculopathy. 3. Status post arthrodesis L3 to sacrum.  PROCEDURES: 1. Decompression of spinal canal via laminectomy, L1 and L2. 2. Decompression of L1, L2, and L3 nerve roots requiring greater work     than for simple interbody fusion approach. 3. Posterior lumbar interbody arthrodesis with PEEK spacers, local     autograft and allograft, L1-2 and L2-3. 4. Posterolateral arthrodesis at L1 and L3. 5. Removal of hardware L3 and L4, segmental fixation L1-L3 with     pedicle screws and rods.  SURGEON:  Stefani Dama, MD  FIRST ASSISTANT:  Cristi Loron, MD  ANESTHESIA:  General endotracheal.  INDICATIONS:  Cody Velez is a 65 year old individual, who has had significant problems with back and bilateral lower extremity pain.  He has evidence of spondylosis at the levels of L1-L2 and L2-L3.  He had a previous fusion, decompression and fusion from L3 to the sacrum done in stages with L4-5 and L5-S1 first being severely degenerated years ago, then developed an adjacent segment disease at L3-L4.  This was characterized by ruptured disk and subsequent degenerative process that required decompression and fusion.  Now, he has degenerative changes at the adjacent level again at L2-3 with the L1-L2 level demonstrating that he has moderate degrees of stenosis with  calcification of posterior longitudinal ligament and evidence of foraminal stenosis.  He has been advised regarding surgery from L1-L3.  PROCEDURE IN DETAIL:  The patient was brought to the operating room, supine on the stretcher.  After smooth induction of general endotracheal anesthesia and placement of Foley catheter and arterial line, he was turned prone.  The back was prepped with alcohol and DuraPrep and draped in sterile fashion and midline incision was created over the previously made incision and extended superiorly.  Dissection was taken down to the lumbodorsal fascia, which was opened on either side of midline to expose the old hardware.  Then from this area, we removed the muscular attachments in a subperiosteal fashion to expose L1 and L2 including a transverse process of L1 and L2, and the hardware at L3 down to the transverse process there.  Wound was packed out to the side and the [facility] retractor was used to maintain the exposure.  Laminectomy was then prepared by using a curette to remove fascial attachments at the lamina of L2 and complete laminectomy of L2 was then undertaken including a facetectomy at the L2-L3 level.  Superiorly, we did a radical laminectomy at L1 with removal of the facet joints and decompression of the laminar arches; however, we did  leave a central portion lamina on the superior aspect to articulate to the interspinous ligament at T12-L1.  With the laminectomy being completed, the path of the roots was identified.  There was substantial epidural bleeding from epidural veins, which were very distended and hypertrophied.  These were carefully cauterized, divided, and then packed.  Nonetheless, the bleeding was pretty steady throughout the whole entirety of the case. We were able to establish the disk spaces, which were severely degenerated and the retrolisthesis was prominent, which contributed to the stenosis, which was worse at L2-3 and an L1  and L2.  Once this area was decompressed, the interspace was entered at L2-3, and a complete diskectomy was performed.  Using a series of disk rongeurs and then curettes to curettage the endplates to allow good bony surface for fusion.  At L2-L3, an 11-mm spacer could be placed, which allowed for adequate distraction of the interspace, opening of the lateral recess foramen.  Once the interspace was packed and decompressed, care was taken to make sure that the pads of the L2 and the L3 nerve roots were well decompressed.  Attention was then turned to L1-L2.  Here, the interspace was also retrolisthesed, moderately stenotic, and again the dura was mobilized and a diskectomy was undertaken first from the right side and then from the left side.  At this level, width of the canal was noted to be somewhat narrower.  Diskectomy was performed using a series of curettes and rongeurs to remove the ossified posterior longitudinal ligament in the axilla of the L1 nerve root.  Because of retraction, there was noted to be a small dural tear that apparently was created.  This was carefully tamponaded.  No significant spinal fluid leakage was observed. Nonetheless, we proceeded with caution and we were able to place 9-mm PEEK spacers into the interspace at the L1 and L2 level.  Once this was completed along with placement of the allograft in this interspace, attention was then turned to the fluoro where we obtained pedicle entry sites for L2 and L3 each times probing the pedicle with a blunt probe, then tapping to 6.5 mm diameter and then sounding each one of the individual holes to make sure there was no cutout.  6.5 x 50 mm screws were placed in L1, 6.5 x 45-mm screws in L2 and L3 screws were the previous screws that were allowed to remain.  Then, we removed the old hardware caps and the rods from L3-L4, and removed the L4 pedicle screws to allow easier adjustment of the construct from L1-L3.  90-mm  straight rods were then used to connect the screws from L1 and L3.  At this point before final tightening, we extended table so as to create a bit of lordosis across this segment.  Once this was achieved, the rods and screws were tightened into locked position.  The lateral gutters which had previously been decorticated were then packed with the autograft bone that was obtained from the previous laminectomy of L1 and L2.  This was packed into the lateral gutters.  After the bone was packed, care was taken to make sure that the L1, L2, and L3 nerve roots were each well decompressed.  The central canal was well decompressed.  There was a small rift of spinal fluid leakage from retraction on the dura on the right side. We placed some DuraSeal over the dura in this area.  At that time, no CSF leakage had occurred.  The wound was carefully  irrigated with antibiotic irrigating solution and then the lumbodorsal fascia was closed with one #1 Vicryl, 2-0 Vicryl was used in the subcutaneous tissues, 3-0 Vicryl subcuticularly, and the dry sterile dressing was placed on the skin over mupirocin ointment.  The patient had a 2000 mL blood loss.  He received 700 mL of Cell Saver blood before the completion of the case.  He will be placed in the ICU for postoperative monitoring.    Stefani Dama, M.D.    [name]  D:  03/01/2011  T:  03/02/2011  Job:  [id number]  Electronically Signed by Barnett Abu M.D. on 03/02/2011 09:33:57 AM

## 2011-03-02 NOTE — Letter (Signed)
Summary: [facility] Office Visit Note   [facility] Office Visit Note   Imported By: Roderic Ovens 02/24/2011 11:22:38  _____________________________________________________________________  External Attachment:    Type:   Image     Comment:   External Document

## 2011-03-03 LAB — CBC
HCT: 31.2 % — ABNORMAL LOW (ref 39.0–52.0)
Hemoglobin: 10 g/dL — ABNORMAL LOW (ref 13.0–17.0)
MCH: 29.3 pg (ref 26.0–34.0)
MCHC: 32.1 g/dL (ref 30.0–36.0)
MCV: 91.5 fL (ref 78.0–100.0)
Platelets: 211 10*3/uL (ref 150–400)
RBC: 3.41 MIL/uL — ABNORMAL LOW (ref 4.22–5.81)
RDW: 14.8 % (ref 11.5–15.5)
WBC: 9.7 10*3/uL (ref 4.0–10.5)

## 2011-03-03 LAB — BASIC METABOLIC PANEL
BUN: 10 mg/dL (ref 6–23)
CO2: 35 mEq/L — ABNORMAL HIGH (ref 19–32)
Calcium: 8.1 mg/dL — ABNORMAL LOW (ref 8.4–10.5)
Chloride: 98 mEq/L (ref 96–112)
Creatinine, Ser: 1.21 mg/dL (ref 0.4–1.5)
GFR calc Af Amer: >60 mL/min (ref >60)
GFR calc non Af Amer: >60 mL/min (ref >60)
Glucose, Bld: 134 mg/dL — ABNORMAL HIGH (ref 70–99)
Potassium: 2.8 mEq/L — ABNORMAL LOW (ref 3.5–5.1)
Sodium: 137 mEq/L (ref 135–145)

## 2011-03-05 ENCOUNTER — Emergency Department (HOSPITAL_COMMUNITY): Payer: Federal, State, Local not specified - PPO

## 2011-03-05 ENCOUNTER — Emergency Department (HOSPITAL_COMMUNITY)
Admission: EM | Admit: 2011-03-05 | Discharge: 2011-03-06 | Disposition: A | Discharge status: Home / Self Care | Payer: Federal, State, Local not specified - PPO | Attending: Emergency Medicine | Admitting: Emergency Medicine

## 2011-03-05 DIAGNOSIS — J189 Pneumonia, unspecified organism: Secondary | ICD-10-CM | POA: Insufficient documentation

## 2011-03-05 DIAGNOSIS — R05 Cough: Secondary | ICD-10-CM | POA: Insufficient documentation

## 2011-03-05 DIAGNOSIS — R51 Headache: Secondary | ICD-10-CM | POA: Insufficient documentation

## 2011-03-05 DIAGNOSIS — Z79899 Other long term (current) drug therapy: Secondary | ICD-10-CM | POA: Insufficient documentation

## 2011-03-05 DIAGNOSIS — Z9889 Other specified postprocedural states: Secondary | ICD-10-CM | POA: Insufficient documentation

## 2011-03-05 DIAGNOSIS — M545 Low back pain, unspecified: Secondary | ICD-10-CM | POA: Insufficient documentation

## 2011-03-05 DIAGNOSIS — R509 Fever, unspecified: Secondary | ICD-10-CM

## 2011-03-05 DIAGNOSIS — K219 Gastro-esophageal reflux disease without esophagitis: Secondary | ICD-10-CM | POA: Insufficient documentation

## 2011-03-05 DIAGNOSIS — I10 Essential (primary) hypertension: Secondary | ICD-10-CM | POA: Insufficient documentation

## 2011-03-05 DIAGNOSIS — R259 Unspecified abnormal involuntary movements: Secondary | ICD-10-CM | POA: Insufficient documentation

## 2011-03-05 DIAGNOSIS — R059 Cough, unspecified: Secondary | ICD-10-CM | POA: Insufficient documentation

## 2011-03-05 LAB — CBC
HCT: 35.5 % — ABNORMAL LOW (ref 39.0–52.0)
Hemoglobin: 11.5 g/dL — ABNORMAL LOW (ref 13.0–17.0)
MCH: 30 pg (ref 26.0–34.0)
MCHC: 32.4 g/dL (ref 30.0–36.0)
MCV: 92.7 fL (ref 78.0–100.0)
Platelets: 260 10*3/uL (ref 150–400)
RBC: 3.83 MIL/uL — ABNORMAL LOW (ref 4.22–5.81)
RDW: 14.5 % (ref 11.5–15.5)
WBC: 8.2 10*3/uL (ref 4.0–10.5)

## 2011-03-05 LAB — URINALYSIS, ROUTINE W REFLEX MICROSCOPIC
Bilirubin Urine: NEGATIVE
Glucose, UA: NEGATIVE mg/dL
Ketones, ur: NEGATIVE mg/dL
Leukocytes, UA: NEGATIVE
Nitrite: NEGATIVE
Specific Gravity, Urine: <1.005 — ABNORMAL LOW (ref 1.005–1.030)
Urobilinogen, UA: 0.2 mg/dL (ref 0.0–1.0)
pH: 6.5 (ref 5.0–8.0)

## 2011-03-05 LAB — DIFFERENTIAL
Basophils Absolute: 0 10*3/uL (ref 0.0–0.1)
Basophils Relative: 0 % (ref 0–1)
Eosinophils Absolute: 0.2 10*3/uL (ref 0.0–0.7)
Eosinophils Relative: 2 % (ref 0–5)
Lymphocytes Relative: 12 % (ref 12–46)
Lymphs Abs: 1 10*3/uL (ref 0.7–4.0)
Monocytes Absolute: 0.8 10*3/uL (ref 0.1–1.0)
Monocytes Relative: 9 % (ref 3–12)
Neutro Abs: 6.3 10*3/uL (ref 1.7–7.7)
Neutrophils Relative %: 77 % (ref 43–77)

## 2011-03-05 LAB — BASIC METABOLIC PANEL
BUN: 10 mg/dL (ref 6–23)
CO2: 25 mEq/L (ref 19–32)
Calcium: 8.8 mg/dL (ref 8.4–10.5)
Chloride: 99 mEq/L (ref 96–112)
Creatinine, Ser: 1.07 mg/dL (ref 0.4–1.5)
GFR calc Af Amer: >60 mL/min (ref >60)
GFR calc non Af Amer: >60 mL/min (ref >60)
Glucose, Bld: 80 mg/dL (ref 70–99)
Potassium: 3.2 mEq/L — ABNORMAL LOW (ref 3.5–5.1)
Sodium: 134 mEq/L — ABNORMAL LOW (ref 135–145)

## 2011-03-05 LAB — URINE MICROSCOPIC-ADD ON

## 2011-03-05 MED ORDER — IOHEXOL 350 MG/ML SOLN
150.0000 mL | Freq: Once | INTRAVENOUS | Status: AC | PRN
  Administered 2011-03-05: 150 mL via INTRAVENOUS

## 2011-04-05 NOTE — Discharge Summary (Signed)
NAMENOAM, KARAFFA             ACCOUNT NO.:  [id number]  MEDICAL RECORD NO.:  [id number]           PATIENT TYPE:  I  LOCATION:  3001                         FACILITY:  [facility]  PHYSICIAN:  Stefani Dama, M.D.  DATE OF BIRTH:  [birth year]  DATE OF ADMISSION:  03/01/2011 DATE OF DISCHARGE:  03/04/2011                              DISCHARGE SUMMARY   ADMITTING DIAGNOSES:  Lumbar spondylosis, L1-2 and L2-3, with multifactorial spinal stenosis, status post previous posterior spinal fusion and decompression at L3 to sacrum.  SECONDARY DIAGNOSES: 1. Hypertension. 2. Sleep apnea, on CPAP at home. 3. Two-vessel coronary artery disease. 4. Hypercholesterolemia. 5. Morbid obesity.  DISCHARGE DIAGNOSES:  Lumbar spondylosis, L1-2 and L2-3, with multifactorial spinal stenosis, status post previous posterior spinal fusion and decompression at L3 to sacrum.  OPERATION PROCEDURES:  Revision posterior spinal fusion and decompression L1-2 and L3-4 with removal of hardware with supplemental pedicle screw fixation and posterolateral arthrodesis, L1-L3.  BRIEF HISTORY AND HOSPITAL COURSE:  Mr. Droke is a 65 year old gentleman who has had significant lumbar spine problems, bilateral lower extremity radiculopathy, previous surgery of posterior spinal fusion, L3 to sacrum.  He developed increasing problems over the last few years and it has been found that he has significant spondylosis and multifactorial spinal stenosis at L1-2 and L2-3 adjacent segment above his fusion.  He failed conservative care and elects to proceed with surgical decompression and fusion.  The patient tolerated the procedure well on March 01, 2011, stabilized in the recovery room, placed in neurosurgical ICU.  He was placed on Dilaudid PCA pump for pain control.  Started with physical therapy, occupational therapy.  Aspen Quikdraw brace was fitted for use while he is up and ambulating.  He remained  neurovascularly intact throughout his hospitalization.  Remained afebrile.  Vital signs were stable.  He did lose about 2000 mL of blood during surgery, 700 mL were returned to him through a Cell Saver.  First day postoperatively, hemoglobin was 10.9, hematocrit of 32.6.  BMET was within normal limits. He was weaned off his PCA pump to p.o. Percocet 10/325 one to two p.o. q.4 h. p.r.n. pain.  Started with physical therapy, occupational therapy for progressive ambulation.  Placed on a bowel regimen.  Second day postoperatively, he was suffering from some significant lumbar muscular spasms with limited in his ambulation.  We added Robaxin p.o. or IV q.6 h. 500 mg.  This seemed to manage his pain as well as adding some Toradol IV.  His dressing was changed.  He continued to mobilize.  He was ready for discharge home on March 04, 2011.  Eating well, voiding well.  Neurovascularly intact.  Afebrile.  Ambulating safely. Understanding his back precautions.  DISCHARGE CONDITION:  Stable, improved.  DISCHARGE INSTRUCTIONS:  Discharge home.  Discontinue IV prior to discharge home.  Given prescription for Percocet 10/325 one to two p.o. q.4 h. p.r.n. pain.  Given prescription for Robaxin 500 mg one p.o. q.6 h. p.r.n. pain and Valium 5 mg one p.o. q.8-12 h. p.r.n., muscle spasm. Follow up in 3 to 4 weeks.  He is to continue on his home medications  of loratadine 10 mg p.o. daily, Flonase nasal spray twice daily, Nexium 40 mg p.o. daily, multivitamin over-the-counter one p.o. daily, gabapentin 300 mg one p.o. t.i.d., fish oil 1200 mg one p.o. daily, Crestor 5 mg one p.o. daily, Cozaar 100 mg p.o. daily, CoQ10 300 mg over-the-counter one p.o. daily, Celebrex 200 mg one p.o. daily, enteric-coated aspirin 81 mg one p.o. daily, Prozac 20 mg one p.o. daily, chlorthalidone 25 mg one p.o. daily.  Contact our office prior to followup if there is any question or concerns.  Call our office [phone] for an  appointment in 3- 4 weeks postoperatively.  He may shower.  Follow his back precautions. Aspen Quikdraw brace was up and about.  Regular home diet.     Aura Fey Bobbe Medico.   ______________________________ Stefani Dama, M.D.    SCI/MEDQ  D:  03/04/2011  T:  03/05/2011  Job:  [id number]  Electronically Signed by Orlin Hilding P.A. on 03/11/2011 09:12:21 AM Electronically Signed by Barnett Abu M.D. on 04/05/2011 10:09:43 AM

## 2011-04-21 ENCOUNTER — Other Ambulatory Visit (HOSPITAL_COMMUNITY): Payer: Self-pay | Admitting: Internal Medicine

## 2011-04-21 ENCOUNTER — Ambulatory Visit (HOSPITAL_COMMUNITY)
Admission: RE | Admit: 2011-04-21 | Discharge: 2011-04-21 | Disposition: A | Discharge status: Home / Self Care | Payer: Federal, State, Local not specified - PPO | Source: Ambulatory Visit | Attending: Internal Medicine | Admitting: Internal Medicine

## 2011-04-21 DIAGNOSIS — R609 Edema, unspecified: Secondary | ICD-10-CM

## 2011-04-21 DIAGNOSIS — R062 Wheezing: Secondary | ICD-10-CM | POA: Insufficient documentation

## 2011-04-21 DIAGNOSIS — R0602 Shortness of breath: Secondary | ICD-10-CM | POA: Insufficient documentation

## 2011-05-04 NOTE — Op Note (Signed)
NAME:  Cody Velez, Cody Velez             ACCOUNT NO.:  [id number]   MEDICAL RECORD NO.:  [id number]          PATIENT TYPE:  AMB   LOCATION:  DAY                           FACILITY:  [facility]   PHYSICIAN:  R. Roetta Sessions, M.D. DATE OF BIRTH:  07/09/1946   DATE OF PROCEDURE:  DATE OF DISCHARGE:                               OPERATIVE REPORT   INDICATIONS FOR PROCEDURE:  A 65 year old gentleman with longstanding  gastroesophageal reflux disease on Nexium and has intermittent  esophageal dysphagia to solids and also has experienced what was  painless back easing out from time to time.  Mother recently passed away  with metastatic colon cancer diagnosed in her 62s, this approach has  been reviewed with Mr. Bautch.  Risks, benefits, alternatives have been  discussed, questions answered, and is agreeable.  Please see the  documentation in the medical record.   PROCEDURE NOTE:  O2 saturation, blood pressure, pulse, respirations  monitored throughout entirety of procedure.   CONSCIOUS SEDATION:  Versed 7 mg IV, Demerol 125 mg IV in divided doses.  Cetacaine spray for topical pharyngeal anesthesia.   INSTRUMENT:  Pentax video chip system.   FINDINGS:  EGD examination of tubular esophagus revealed a Schatzki's  ring.  Esophageal mucosa otherwise appeared normal.  EG junction easily  traversed.  Stomach:  Gastric cavity was emptied and insufflated well  with air.  Thorough examination of gastric mucosa including retroflexion  at the proximal stomach and esophagogastric junction demonstrated a  small hiatal hernia.  Multiple 1-3 mm fundal gland type polyps and  diffuse submucosal petechiae at the gastric mucosa.  There was no  infiltrating process, no ulcer.  Pylorus is patent and easily traversed.  Examination of the bulb second portion revealed no abnormalities.  Therapeutic/diagnostic maneuvers performed:  A 56-French Maloney dilator  was passed in full insertion.  A look back revealed the ring  had been  untouched with this maneuver.  Subsequently, the scope was withdrawn.  A  58-French Maloney dilator was passed in full insertion with minimal  resistance.  Upon full insertion, a look back revealed the ring remained  very much intact.  Subsequently, 4 quadrant bites of the ring were taken  with the biopsy forceps to disrupt.  This was done effectively without  apparent complication.  Subsequently, biopsies of the gastric body and  antrum were taken to further evaluate for H. pylori, etc.  The patient  tolerated the procedure  and was retracted in colonoscopy  Digital  rectal exam revealed no abnormalities.   ENDOSCOPIC FINDINGS:  Prep was marginal.  Colon:  Colonic mucosa was  surveyed from the rectosigmoid junction through the left transverse,  right colon, appendiceal orifice, ileocecal valve, and cecum.  These  structures well seen, photographed for the record.  Terminal ileum was  intubated to 10 cm from this level.  Scope was slowly withdrawn.  A  previous mucosal surfaces were again seen.  The patient had 3 diminutive  polyps at the cecum which were all cold biopsied/removed; largest was no  more than 2 mm in diameter.  There was a fourth diminutive polyp  in mid  sigmoid which was cold biopsied/remainder of colonic mucosa appeared  normal.  Scope was pulled down the rectum.  A thorough examination of  rectal mucosa including retroflex view and anal verge demonstrated  minimal friable anal canal hemorrhoids.  The patient tolerated both  procedures well and was reacted in Endoscopy.   IMPRESSION:  EGD.  Schatzki's ring status post dilation and disruption  as described above, otherwise unremarkable esophagus, small hiatal  hernia.  Multiple fundal gland polyps not manipulated, diffuse gastric  submucosal petechiae query gastritis status post biopsy, otherwise  stomach appeared normal, patent pylorus, normal D1 and D2, colonoscopy.   FINDINGS:  1. Minimal anal canal  friability, otherwise normal rectum.  2. Dimunitive polyps in sigmoid and cecum removed as described above.      The membranes of colonic mucosa appeared normal.   RECOMMENDATIONS:  1. Continue Nexium 40 mg orally daily.  2. Followup on path.  3. Further recommendations to follow.      Jonathon Bellows, M.D.  Electronically Signed     RMR/MEDQ  D:  10/15/2008  T:  10/15/2008  Job:  [id number]   cc:   Kingsley Callander. Ouida Sills, MD  Fax: [phone]

## 2011-05-04 NOTE — Consult Note (Signed)
NAME:  Cody Velez, Cody Velez             ACCOUNT NO.:  [id number]   MEDICAL RECORD NO.:  [id number]          PATIENT TYPE:  AMB   LOCATION:  DAY                           FACILITY:  [facility]   PHYSICIAN:  R. Roetta Sessions, M.D. DATE OF BIRTH:  03-Sep-1946   DATE OF CONSULTATION:  09/30/2008  DATE OF DISCHARGE:                                 CONSULTATION   CHIEF COMPLAINT:  Rectal bleeding.   PHYSICIAN CO-SIGNING:  Jonathon Bellows, MD   HISTORY OF PRESENT ILLNESS:  Mr. Petzold is a pleasant 65 year old  gentleman who presents today for further evaluation of intermittent  rectal bleeding.  His last colonoscopy was in December 2004, and was  normal.  This past week, his mother died.  She was found to have GI  tumors.  According to the patient, it was felt that a cancer may have  originated from her proximal colon.  She apparently had massive  hemorrhage.  She ultimately died due to complications from perforated  colon at time of colonoscopy.  She also had some sort of chronic blood  disorder, but he does not have full details regarding that.  The patient  states given the recent circumstances and he has had ongoing rectal  bleeding for about 1-2 months, he desires to have colonoscopy.  He  denies any diarrhea.  He has occasional constipation when he travels.  Denies any abdominal pain, rectal pain, nausea or vomiting.  He does  have chronic GERD, but symptoms are well controlled on Nexium.  Over the  last couple months, he has had some dysphagia to solid foods.   CURRENT MEDICATIONS:  1. Fish oil 1000 mg daily.  2. B12 1000 mg daily.  3. One source 50 plus daily.  4. Cozaar 100 mg daily.  5. Nexium 40 mg daily.  6. Simvastatin 40 mg daily.  7. Chlorthalidone 25 mg daily.  8. Clarinex 5 mg daily.   ALLERGIES:  ? SUDAFED cause tongue swelling.   PAST MEDICAL HISTORY:  Hypertension, GERD, hypercholesterolemia,  obstructive sleep apnea.   PAST SURGICAL HISTORY:  He has had five back  surgeries, 3-5 knee  surgeries bilaterally, he has had facial surgery due to softball injury,  sinus surgery, and left wrist surgery.   FAMILY HISTORY:  Mother deceased at age 50 due to perforated colon at  time of colonoscopy which is being done to evaluate metastatic cancer  possibly colon cancer per family.  Father deceased at age 39 with lung  cancer.  Mother according to the patient's prior family history had a  history of polycythemia.   SOCIAL HISTORY:  He is married, he has three daughters.  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  He quit smoking in 1970.  History of chewing tobacco.  He occasionally consumes alcohol.   REVIEW OF SYSTEMS:  See HPI for GI.  CONSTITUTIONAL:  No unintentional  weight loss.  CARDIOPULMONARY:  Denies chest pain, shortness of breath,  palpitations or cough.  GENITOURINARY:  Denies any dysuria or hematuria.   PHYSICAL EXAMINATION:  VITAL SIGNS:  Weight 313.5, height 6  foot 5  inches, temperature 98.2, blood pressure 100/80, pulse 84.  GENERAL:  Pleasant well-nourished, well-developed obese Caucasian male  in no acute distress.  SKIN:  Warm, dry.  No jaundice.  HEENT:  Sclerae nonicteric.  Oropharyngeal mucosa moist and pink.  No  lesions, erythema or exudate.  No lymphadenopathy or thyromegaly.  CHEST:  Lungs are clear to auscultation.  CARDIAC:  Regular rate and rhythm.  Normal S1 and S2.  No murmurs, rubs  or gallops.  ABDOMEN:  Positive bowel sounds.  Abdomen is obese, soft, nontender,  nondistended.  No organomegaly or masses.  No rebound or guarding.  No  abdominal bruits or hernias.  LOWER EXTREMITIES:  No edema.   IMPRESSION:  The patient is a 65 year old gentleman with 41-month history  of intermittent hematochezia.  Last colonoscopy 5 years ago.  Mother  recently died with probable metastatic colon cancer.  The patient  desires colonoscopy which is recommended at this point in time.  He also  complains of chronic  gastroesophageal reflux disease with 61-month  history of worsening dysphagia.  We will recommend EGD as well.  I have  discussed risks, alternatives, benefits with regards to but not limited  to the risk of reaction to medication bleeding, perforation, infection  and he is agreeable to proceed.   PLAN:  EGD and colonoscopy in near future with Dr. Sherene Sires.  Further  recommendations to follow.      Tana Coast, P.AJonathon Bellows, M.D.  Electronically Signed    LL/MEDQ  D:  10/01/2008  T:  10/01/2008  Job:  [id number]   cc:   Kingsley Callander. Ouida Sills, MD  Fax: [phone]

## 2011-05-04 NOTE — Assessment & Plan Note (Signed)
[facility]                            CARDIOLOGY OFFICE NOTE   Cody Velez                    MRN:          [id number]  DATE:04/29/2008                            DOB:          1946-06-04    [name] returns today for further management of his coronary  disease.   He is having some dyspnea on exertion, otherwise negative.  He has been  having a lot of problems with his back, and his exercise has been  limited.  He does complain of some aching in his left leg and sometimes  cramping of his calf.  This is not clearly exertion related.  He has had  an MRI by Dr. Danielle Velez, who said it was not his back.  He has had a lot of  back operations.   He has obstructive sleep apnea and wears CPAP.  He has hypertension and  hyperlipidemia, followed by Dr. Ouida Velez.   He has had no further orthostatic symptoms which we have seen him for in  the past.   He is currently on Cozaar 100 mg a day, Nexium 40 mg a day, aspirin 81  mg a day, amlodipine 5 mg a day, simvastatin 40 mg at bedtime.   He has no orthopnea, PND, or peripheral edema.   His last objective assessment for his coronary disease was a stress  Myoview on May 25, 2005.  It showed an EF of 60%, with no definite  ischemia.  He had an inferior septal and anterior septal wall defect,  both of which were nonreversible.   PHYSICAL EXAMINATION:  VITAL SIGNS:  Blood pressure 137/87, pulse 80 and  regular.  His weight is 307, stable.  His electrocardiogram shows sinus  rhythm, normal EKG otherwise, except for maybe some right atrial  enlargement.  HEENT:  Normocephalic, atraumatic.  PERRL.  Extraocular movements  intact.  Sclerae are clear.  Facial symmetry is normal.  Carotid  upstrokes were equal bilaterally, without bruits.  No JVD.  Thyroid is  not enlarged.  Trachea is midline.  NECK:  Supple.  LUNGS:  Clear.  HEART:  Reveals a nondisplaced PMI.  Normal S1, S2.  ABDOMEN:  Protuberant, with  good bowel sounds.  No midline bruits.  EXTREMITIES:  Reveal no cyanosis, clubbing, or edema.  Pulses are  intact.  NEUROLOGIC:  Intact.   I had a nice chat with Cody Velez today.  I do not think his lower  extremity discomfort is either statin related or circulation related.  I  am not sure exactly what to attempt at this point in time.   With his dyspnea and his history of coronary disease he  needs objective  assessment at this point.  It has been almost 3 years since we have  assess this.   PLAN:  1. Adenosine rest/stress Myoview.  2. No change in medications.  3. See [place] back in a year.     Cody C. Daleen Squibb, MD, [facility]  Electronically Signed    TCW/MedQ  DD: 04/29/2008  DT: 04/29/2008  Job #: [id number]   cc:   Cody Velez. Cody Sills,  MD

## 2011-05-07 NOTE — Op Note (Signed)
NAME:  Cody Velez, Cody Velez             ACCOUNT NO.:  [id number]   MEDICAL RECORD NO.:  [id number]          PATIENT TYPE:  AMB   LOCATION:  SDS                          FACILITY:  [facility]   PHYSICIAN:  Jefry H. Pollyann Kennedy, MD     DATE OF BIRTH:  Nov 30, 1946   DATE OF PROCEDURE:  12/03/2005  DATE OF DISCHARGE:  12/03/2005                                 OPERATIVE REPORT   PREOPERATIVE DIAGNOSIS:  Chronic sinusitis.   POSTOPERATIVE DIAGNOSIS:  Chronic sinusitis.   PROCEDURE:  1.  Bilateral endoscopic total ethmoidectomy.  2.  Bilateral endoscopic maxillary antrostomy.  3.  Bilateral endoscopic frontal sinusotomy.   SURGEON:  Jefry H. Pollyann Kennedy, M.D.   ANESTHESIA:  General endotracheal anesthesia.   COMPLICATIONS:  None.   FINDINGS:  Diffuse polypoid disease throughout the ethmoid complex and the  infundibular area bilaterally as well as the frontal recess and up into the  frontal sinus.   ESTIMATED BLOOD LOSS:  100 mL.   COMPLICATIONS:  None.   DISPOSITION:  The patient tolerated the procedure well, was awakened,  extubated, and transferred to the recovery room in stable condition.   HISTORY:  This is a 65 year old gentleman with a long history of chronic  sinusitis who has failed maximum medical therapy.  The risks, benefits,  alternatives, and complications of the procedure were explained to the  patient who seemed to understand and agreed to the surgery.   PROCEDURE:  The patient was taken to the operating room and placed on the  operating table in supine position.  Following induction of general  endotracheal anesthesia, the patient was prepped and draped in a standard  fashion.  Oxymetazoline spray was used in the nasal cavities preoperatively.  1% Xylocaine with epinephrine was infiltrated into the superior and  posterior attachments of the middle turbinates bilaterally as well as the  lateral nasal wall bilaterally.   1.  Bilateral endoscopic total ethmoidectomy.  The sickle  knife was used to      remove the uncinate process at its base.  The bullae was then exposed      bilaterally.  A complete ethmoid dissection was performed using the      microdebrider.  The lamina papyracea was the lateral limits of      dissection and was kept intact bilaterally.  There was a small      dehiscence noted superiorly on the lamina on the right but there was no      bleeding and no herniation of orbital contents.  The roof of the ethmoid      was the superior limit of the dissection.  This was kept intact.  The      ground lamella was taken down to expose the posterior ethmoid cells      bilaterally.  The middle turbinate was left with its superior      attachment.  Multiple polyps and diffuse polypoid degeneration of the      ethmoid mucosa was identified bilaterally.  All specimens were sent for      pathologic evaluation.   1.  Bilateral endoscopic maxillary  antrostomy.  After the uncinate was      removed and the bullae was resected, the 30 degree nasal endoscope was      used to examine the infundibular area and the natural ostium and      maxillary sinus was identified.  It was then enlarged posteriorly using      straight through cut forceps and anteriorly using the back biting      forceps.   1.  Bilateral endoscopic frontal sinusotomy.  After the completion of the      ethmoid dissection bilaterally, the frontal recess was inspected with a      30 degree scope.  Diffuse polypoid tissue was identified bilaterally in      the frontal recess and was cleaned out using upbiting and giraffe      forceps.  The frontal sinuses were entered using suction and were,      otherwise, unobstructed but there was diffuse polypoid tissue present      bilaterally.  The frontal recess was packed with Gelfoam bilaterally.      The ethmoid cavities were packed with Afrin soaked pledgets which were      then removed in the recovery room.  The pharynx was suctioned of blood      and  secretions under direct visualization.  The patient was then      awakened, extubated, and transferred to the recovery room.      Jefry H. Pollyann Kennedy, MD  Electronically Signed     JHR/MEDQ  D:  12/03/2005  T:  12/06/2005  Job:  [id number]

## 2011-05-07 NOTE — H&P (Signed)
Cody Velez, Cody Velez             ACCOUNT NO.:  [id number]   MEDICAL RECORD NO.:  [id number]          PATIENT TYPE:  INP   LOCATION:  4731                         FACILITY:  [facility]   PHYSICIAN:  Cody Velez, M.D.   DATE OF BIRTH:  01-Feb-1946   DATE OF ADMISSION:  05/19/2005  DATE OF DISCHARGE:                                HISTORY & PHYSICAL   CARDIOLOGIST:  New, being seen by Dr. Juanito Velez   PRIMARY CARE PHYSICIAN:  Dr. Carylon Velez   CHIEF COMPLAINT:  Chest pain.   This is a 65 year old Caucasian gentleman in no acute distress who presents  to [facility] telemetry unit after being admitted to [facility] from [facility] where he was seen this morning in the emergency department  with complaints of chest pain.  Cody Velez states he began having chest  discomfort this morning on the way to work and drove himself to [facility] for further evaluation.  Onset of chest discomfort this a.m.  started around his left rib cage, spread across his chest to his midsternal  area.  He rated his chest discomfort a 3-4 on a scale of 1-10.  He denied  any associated symptoms such as nausea, vomiting, diaphoresis,  lightheadedness, or dizziness with it.  He states he has felt bad about a  week, just not feeling good.  Recently has been getting more short of breath  with minimal activity; however, he states his exercise tolerance is  unchanged as far as his energy level.  Cody Velez had a stress test done in  September of 2005 for vague symptoms of chest discomfort at that time  showing a left ventricular ejection fraction of 56% with no evidence of  stress-induced myocardial ischemia.  EKG at [facility] without ST or T-wave  changes.  Cardiac enzymes done at [facility] are negative x2.  Cody Velez  with multiple risk factors was transferred to [facility] for a cardiac  catheterization/further work-up.   ALLERGIES:  No known allergies.   HOME MEDICATIONS:  1.  Prilosec.  2.  Cozaar 100 mg.  3.  Celebrex.  4.  Aspirin 325 mg.  5.  Cardura 8 mg daily.   At [facility] he was given a 5000 unit heparin bolus and started on a  heparin drip at 1000.  He was also started on nitroglycerin drip, given 5 mg  of Lopressor IV.  Currently, Cody Velez is leaning slightly on the hospital  bed complaining of chest pressure still around 3-4/10.  Family at bedside.   PAST MEDICAL HISTORY:  1.  He had a prior cardiac catheterization in 1980 in [place], [place].      Patient states at that time he was in college and had some left arm pain      with decreased sensation and was cardiac catheterized with no known      coronary artery disease.  2.  Other medical history includes GERD.  3.  Hypertension.  4.  Hiatal hernia.  5.  Back surgery x4.  6.  Knee surgery x2.  7.  He has had facial reconstruction secondary to a softball injury in 1990.   SOCIAL HISTORY:  He lives in [place] with his wife.  He works for the  [employer].  He has three adult children alive and well.  Denies any tobacco use.  However, daughter states patient still smokes cigars occasionally and chews  tobacco occasionally.  Exercises very little per patient.  Alcohol socially,  maybe one drink a month.  He tries to eat a low red meat diet.  Denies any  drug or herbal medication use.   FAMILY HISTORY:  Mother alive at 83 with polycythemia.  Father deceased at  age 32 from an MI.  He has a brother who had a heart attack at age 67 and a  sister who has had TIAs.   REVIEW OF SYSTEMS:  Positive for chest pain, shortness of breath, dyspnea on  exertion, arthritic pain in back, knees, and wrists.  Other review of  systems negative.   PHYSICAL EXAMINATION:  VITAL SIGNS:  Temperature 97.8, pulse 74 and regular,  respirations 20, blood pressure 141/89, weight 290.7 pounds, saturation 97%  on 2 L.  GENERAL:  He is in no acute distress.  Very pleasant, cooperative gentleman.  HEENT:  Pupils are equal, round,  and reactive to light.  Sclerae is clear.  Dentition is okay.  NECK:  Supple without lymphadenopathy.  Negative bruit.  Negative JVD.  CARDIOVASCULAR:  Heart rate regular rhythm.  S1 and S2.  No S3 or S4.  Pulses are 2+ and equal without bruit.  LUNGS:  Clear to auscultation bilateral.  SKIN:  Warm and dry.  ABDOMEN:  Soft, nontender.  Positive bowel sounds.  Unable to palpate liver  margins.  EXTREMITIES:  No clubbing, cyanosis, edema.  MUSCULOSKELETAL:  No joint deformity or effusions.  No spine or CVA  tenderness.  NEUROLOGIC:  He is alert and oriented x3.  Cranial nerves II-XII grossly  intact.   Chest x-ray at [facility] showing cardiomegaly, no acute disease.  EKG at  [facility]:  Normal sinus rhythm with a rate of 78, PR 164, QRS 90, QTc of  416.  Laboratory work at [facility] showing a D-dimer of 0.3.  BNP less than  30.  White blood cell count 6.2, hemoglobin 14.9 with a hematocrit of 43.8,  platelets 290.  Sodium 139, potassium 3.7, BUN 8, creatinine 1, glucose 90.  Point of care enzymes negative x2 Cody Velez.  Dr. Juanito Velez in to examine  and assess patient.   IMPRESSION:  1.  Unstable angina.  2.  Hypertension.  3.  Obesity.  4.  Tobacco abuse.   Patient with numerous cardiac risk factors, currently hypertensive,  complaining of some mild mid sternal discomfort.  Patient will be scheduled  for cardiac catheterization for this p.m.  We will continue heparin and  nitroglycerin.  Continue his aspirin and Cozaar for blood pressure control  along with Cardura.  Patient will also be placed on a PPI and low dose beta  blocker.       MB/MEDQ  D:  05/19/2005  T:  05/19/2005  Job:  [id number]

## 2011-05-07 NOTE — H&P (Signed)
Cody Velez, Cody Velez             ACCOUNT NO.:  [id number]   MEDICAL RECORD NO.:  [id number]          PATIENT TYPE:  OBV   LOCATION:  2039                         FACILITY:  [facility]   PHYSICIAN:  Joellyn Rued, PA-C     DATE OF BIRTH:  Apr 23, 1946   DATE OF ADMISSION:  12/19/2006  DATE OF DISCHARGE:  12/20/2006                              HISTORY & PHYSICAL   PRIMARY CARE PHYSICIAN:  Kingsley Callander. Ouida Sills, MD   NEUROSURGEON:  Stefani Dama, M.D.   GASTROENTEROLOGIST:  Jonathon Bellows, M.D.   PULMONOLOGIST:  Barbaraann Share, MD,FCCP   CARDIOLOGIST:  Jesse Sans. Wall, MD, FACC   SUMMARY OF HISTORY:  Cody Velez is a 65 year old white male who was  transported via EMS to [facility] Emergency Room secondary to near  syncope.  Cody Velez states that while at work today at approximately  7:30, sitting at his desk talking he suddenly developed this strange  feeling throughout his body, but particularly in his head.  He then  immediately developed a hot flash extending through his body.  He  removed his sweater and then looked at his computer screen, and he noted  that he could not differentiate the computer icons.  All these symptoms  lasted less than 2 minutes.  He drank some water and sat outside, but he  decided to go home as he did not really need to be at work.  On the way  home he states that he just did not feel right, and he could not  explain, so he stopped at the fire station, and they checked his blood  pressure, and this was 140/90.  It was recommended that he be  transported to the hospital for further evaluation.  EMS report is not  available.  Here in the emergency room he has not had any further  episode.  Cody Velez relates similar episodes occurring approximately  one time per year.  He states that he has had 2 episodes within the last  month.  He denies any actual loss of consciousness.  He also seems to  think his brother and his mother have the same symptoms.   PAST  MEDICAL HISTORY:  ALLERGIES INCLUDE CODEINE.   MEDICATIONS:  Obtained from his wife via the phone reflect:  1. HCTZ 25 mg daily.  2. Aspirin 81 mg daily.  3. Cozaar 100 mg daily.  4. Celebrex 200 mg b.i.d.  5. Nexium 40 mg daily.  6. Doxazosin 4 mg daily.  7. B12 daily unknown dosage.  8. Simvastatin 40 mg q.h.s.   PAST MEDICAL HISTORY:  Notable for chronic headaches and recent MRI on  December 08, 2006 ordered by Dr. Ouida Sills.  This did not show any evidence  of acute changes.  He has mild focus of subcortical white matter  changes, probably representing small vessel-type disease secondary to  chronic hypertension and/or diabetes; less likely possibilities were  demyelinating process or vasculitis, mild-to-moderate pansinusitis, mild  asymmetric prominence of the cavernous segment of the right ICA, which  probably represents vessel tortuosity.  He has also had a negative  colonoscopy in December 2004, and EGD in July 2003 showed a small hiatal  hernia and antral gastritis.  He has a history of hypertension.  He does  not check at home.  He has been diagnosed with OSA.  He does wear a  CPAP.  Hyperlipidemia, unknown last check.  He was hospitalized for  chest discomfort in June 2006.  Catheterization at that time showed a 50  to 60% mid LAD, 40% diagonal 1, 30% OM2, 50% proximal OM3, 20% distal  RCA with a normal LV function.  Stress Myoview was performed on May 25, 2005 to follow up on the catheterization results.  This showed an EF of  60%.  He has had 5 back surgeries.  Sinus surgery and knee surgery and  facial surgery secondary to trauma.   SOCIAL HISTORY:  He resides in [place] with his wife of 36 years.  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  He has 3  daughters, 5 grandchildren.  He has not smoked cigarettes since 1970.  However, he continues to chew one pack every 2 days.  He has not smoked  cigars in several years.  He might have some alcohol 2 times  a year.  He  denies any drugs, herbal medications, specific diet or exercise program.   FAMILY HISTORY:  Mother is alive at the age of 38 with a history of  glaucoma and polycythemia.  His father died at the age of 83 from a  multitude of problems which include lung cancer, MI, CHF.  He has a  brother alive and well at 33, history of hypertension.  He states that  his brother has never had a heart attack despite what medical records  reflect.  He has a sister in her 17s who has a history of TIAs.   REVIEW OF SYSTEMS:  In addition to above is notable for about a 10-pound  weight gain in the last 1-2 months, chronic headaches.  He wears  occasional glasses.  Chronic dyspnea on exertion that is not changes.  Pedal edema which resolves in the evening.  Nocturia, numbness in the  right knee, arthralgias in the back and the right knee, rare  constipation, rare positional dizziness from sitting to standing.  He  has not had any of that today, obesity.   PHYSICAL EXAMINATION:  GENERAL:  A well-nourished, well-developed obese  white male in no apparent distress.  His daughter is present.  VITAL SIGNS:  Temperature 98.3, blood pressure 152/76, pulse 78,  respirations 20, 70% sat on room air.  In the emergency room he has  received a total of 30 mEq of potassium and Tylenol.  HEENT:  Unremarkable.  NECK:  Supple, without thyromegaly, adenopathy, JVD or carotid bruits.  CHEST:  Symmetrical excursion.  LUNGS:  Clear to auscultation without rales, rhonchi or wheezes.  HEART:  PMI is not displaced, regular rate and rhythm.  I did not  appreciate any murmur, rub, click or gallop.  All pulses are symmetrical and equal.  I did not appreciate any  abdominal or femoral bruits.  SKIN/INTEGUMENTARY:  Appears to be intact.  ABDOMEN:  Obese; bowel sounds are present without organomegaly, masses  or tenderness.  EXTREMITIES:  Negative cyanosis, clubbing or edema. MUSCULOSKELETAL:  Grossly unremarkable.   NEUROLOGICAL:  Alert and oriented x3.  Cranial nerves II-XII are grossly  intact.   STUDIES/LABORATORY DATA:  Chest x-ray has not been performed at the time  of this  dictation.  Head CT in the emergency room did not show any acute  findings.  He has small vessel ischemic changes in the periventricular  matter which feels to be stable.  An EKG shows normal sinus rhythm based  on artifact left axis deviation, normal intervals.  Old EKGs are not  available.  An I stat showed an H and H of 15.2 and 45.5.  Sodium 137,  potassium 3.1, BUN 12, creatinine 1.2, glucose 80.   IMPRESSION:  1. Near syncope.  2. Hypertension.  3. Hypokalemia.  4. Obesity.  5. History per past medical history.   DISPOSITION:  Dr. Jens Som has reviewed the patient's history.  He spoke  with and examined the patient and agrees with the above.  We will admit  him to telemetry for observation and rule out myocardial infarction and  dysrhythmia.  If enzymes are negative, I anticipate discharge with an  outpatient echocardiogram that has been arranged on December 22, 2006 at 3  p.m., as well as an event monitor.  The office will call him at home  with arrangements for an event monitor to be placed.  He will have a  followup appointment with Dr. Daleen Squibb on January 09, 2007 at 4:45 in  [place].  If his symptoms persist, he should undergo an outpatient  neuro evaluation.  He will also need p.o. potassium supplementation  given his low potassium on admission and HCTZ use.   DICTATED BY:  Joellyn Rued, PA-C for Madolyn Frieze. Jens Som, MD, [facility]      Joellyn Rued, PA-C     EW/MEDQ  D:  12/19/2006  T:  12/20/2006  Job:  [id number]   cc:   Stefani Dama, M.D.  Jonathon Bellows, M.D.  Barbaraann Share, MD,FCCP  Jesse Sans. Wall, MD, [facility]

## 2011-05-07 NOTE — Op Note (Signed)
NAME:  Cody Velez, Cody Velez                       ACCOUNT NO.:  [id number]   MEDICAL RECORD NO.:  [id number]                   PATIENT TYPE:  AMB   LOCATION:  DAY                                  FACILITY:  [facility]   PHYSICIAN:  R. Roetta Sessions, M.D.              DATE OF BIRTH:  1946/05/14   DATE OF PROCEDURE:  12/17/2003  DATE OF DISCHARGE:                                 OPERATIVE REPORT   PROCEDURE:  Screening colonoscopy.   INDICATIONS:  The patient is a 65 year old gentleman sent over at the  request of Dr. Ouida Sills for colorectal cancer screening.  He is devoid of any  lower GI tract symptoms.  He has no family history of colorectal neoplasia.  He tells me he had a colonoscopy back in the 1980's for some rectal bleeding  without any significant findings.  He tells me this was done at [facility].  Colonoscopy is now being done as a standard screening maneuver.  This  approach has been discussed with the patient at length at the bedside.  The  potential risks, benefits and alternatives have been reviewed.  Please see  my handwritten H&P for more information.   DESCRIPTION OF PROCEDURE:  Oxygen saturation, blood pressure, pulse and  respiration were monitored throughout the entire procedure.  Conscious  sedation with Versed 5 mg IV and Demerol 100 mg IV in divided doses.  The  instrument was the Olympus video tip adult colonoscope.   FINDINGS:  Digital exam revealed no abnormalities.   ENDOSCOPIC FINDINGS:  Prep was adequate.  Rectum:  Examination of the rectal mucosa including retroflexion in the anal  verge revealed only internal hemorrhoids.  Colon:  Colonic mucosa was surveyed from the rectosigmoid junction to the  left, transverse, right colon to the area of the appendiceal orifice,  ileocecal valve and cecum. These structures were well seen and photographed  for the record.  From the level the scope was slowly withdrawn.  All  previously mentioned mucosal surfaces were again  seen.   The colonic mucosa appeared normal.  The patient tolerated the procedure  well and was reactive to endoscopy.   IMPRESSION:  1. Normal rectal.  2. Normal colon.   RECOMMENDATIONS:  Repeat colonoscopy in 10 years.      ___________________________________________                                            Jonathon Bellows, M.D.   RMR/MEDQ  D:  12/17/2003  T:  12/17/2003  Job:  [id number]   cc:   Kingsley Callander. Ouida Sills, M.D.  [address]  [place]  [place] [address]  Fax: [phone]

## 2011-05-07 NOTE — Op Note (Signed)
[facility]  Patient:    Cody Velez, Cody Velez Visit Number: [id number] MRN: [id number]          Service Type: END Location: DAY Attending Physician:  Malissa Hippo Dictated by:   Lionel December, M.D. Proc. Date: 06/21/02 Admit Date:  06/21/2002 Discharge Date: 06/21/2002                             Operative Report  PROCEDURE:  Esophagogastroduodenoscopy.  ENDOSCOPIST:  Lionel December, M.D.  INDICATION:  The patient is a 65 year old Caucasian male who has had at least a 10-year history of heartburn, who has been doing well on antireflux measures and a PPI, but now he is having breakthrough symptoms, usually nocturnal, but he is also having them during the daytime.  He has reflux, coughing and sore throat.  He is on ASA as well as Celebrex but has not experienced any abdominal pain or melena.  Procedure and risks were reviewed with the patient and informed consent was obtained.  Please note that this is his first EGD.  PREOPERATIVE MEDICATIONS:  Cetacaine spray for oropharyngeal topical anesthesia, Demerol 50 mg IV and Versed 6 mg IV in divided dose.  INSTRUMENT:  Olympus video system.  FINDINGS:  Procedure was performed in endoscopy suite.  The patients vital signs and O2 saturations were monitored during the procedure and remained stable.  The patient was placed in the left lateral decubitus position and endoscope was passed via oropharynx without any difficulty into esophagus.  Esophagus:  Mucosa of the esophagus was normal.  He had noncritical ring about 3 cm proximal to the GE junction.  There was focal erythema at gastroesophageal junction.  There was no Schatzkis ring.  Small sliding hiatal hernia was noted.  Stomach:  It was empty and distended very well with insufflation.  Folds in the proximal stomach were normal.  Examination of the mucosa revealed patchy erythema and a few petechiae at antrum but no erosions or ulcers noted. Pyloric  channel was patent.  Angularis and fundus were examined by retroflexing the scope and were normal.  Duodenum:  Examination of the bulb and second part of the duodenum was normal. Endoscope was withdrawn.  No biopsies were taken.  The patient tolerated the procedure well.  FINAL DIAGNOSES: 1. Small sliding hiatal hernia with mild changes of reflux esophagitis limited    to gastroesophageal junction.  Noncritical ring at distal esophagus, 3 cm    proximal to gastroesophageal junction, which was not dilated as the patient    does not have any history of dysphagia. 2. Antral gastritis. 3. While it is reassuring to note that he does not have Barretts esophagus or    other complications of chronic gastroesophageal reflux disease, I think his    breakthrough symptoms may well be related to use of ASA and Celebrex.  RECOMMENDATIONS: 1. Will switch him to Nexium 40 mg q.a.m., which will give him ______ for a    few more hours than he would get with Prilosec. 2. H. pylori serology will be checked. 3. I would like for him to drop his ASA to one daily and consider dropping    Celebrex dose after review with Dr. Carylon Perches.  I will be calling the    patient with results of H. pylori within the next few days. 4. If he continues to have breakthrough symptoms with Nexium, he may be a    candidate for low-dose  promotility agent. Dictated by:   Lionel December, M.D. Attending Physician:  Malissa Hippo DD:  06/21/02 TD:  06/25/02 Job: [id number]

## 2011-05-07 NOTE — Procedures (Signed)
[facility]  Patient:    Cody Velez, Cody Velez Visit Number: [id number] MRN: [id number]          Service Type: OUT Location: RAD Attending Physician:  Carylon Perches Dictated by:   Carylon Perches, M.D. Admit Date:  12/19/2001                                Stress Test  [name] exercised 7 minutes 57 seconds (1 minute 57 seconds into stage III of the Bruce protocol), obtaining a maximum heart rate of 164 (99% of the age predicted maximum heart rate) at a workload of 10.1 mets and discontinued exercise due to fatigue.  There were no symptoms of chest pain.  There were no ST segment changes diagnostic of ischemia.  There were infrequent premature ventricular complexes.  The baseline electrocardiogram revealed normal sinus rhythm at 71 beats per minute.  He had previously had a change in his EKG consistent with an inferior infarct.  An echocardiogram had questionably revealed a hypokinetic inferior wall.  The Cardiolite stress test was done to evaluate for possible underlying coronary artery disease.  IMPRESSION:  No evidence of exercised-induced ischemia.  Cardiolite images pending. Dictated by:   Carylon Perches, M.D. Attending Physician:  Carylon Perches DD:  12/19/01 TD:  12/19/01 Job: [id number]

## 2011-05-07 NOTE — Discharge Summary (Signed)
NAMEEDWING, FIGLEY             ACCOUNT NO.:  [id number]   MEDICAL RECORD NO.:  [id number]          PATIENT TYPE:  INP   LOCATION:  4731                         FACILITY:  [facility]   PHYSICIAN:  Thomas C. Wall, M.D.   DATE OF BIRTH:  1946-06-29   DATE OF ADMISSION:  05/19/2005  DATE OF DISCHARGE:  05/20/2005                                 DISCHARGE SUMMARY   PROCEDURE:  Cardiac catheterization May 20, 2005.   REASON FOR ADMISSION:  Mr. Baumgart is a 65 year old male with no prior  history of documented coronary artery disease, but with several cardiac risk  factors who initially presented to [facility] with symptoms  worrisome for unstable angina pectoris.  Following stabilization,  arrangements were made for subsequent transfer to Dr. Valera Castle for  further evaluation and management.  Please refer to dictated admission note  for full details.   LABORATORY DATA:  Normal serial cardiac markers.  Normal D-dimer.  Normal  metabolic profile.  Normal CBC on admission.  BNP less than 30.  Hemoglobin  A1c 5.3.  Admission chest x-ray:  No acute changes.   HOSPITAL COURSE:  The patient was admitted for further evaluation and  management of symptoms worrisome for unstable angina pectoris.  Serial  cardiac markers were all within normal limits.  Chest x-ray revealed no  evidence of congestive heart failure and BNP was negative.   The patient was subsequently referred for diagnostic cardiac catheterization  following morning performed by Dr. Loraine Leriche Pulsipher.  See report for full  details.  This revealed moderate two-vessel coronary artery disease of  borderline severity and normal left ventricular function with no focal wall  motion abnormalities.  Additionally, Dr. Gerri Spore did a distal aortogram  revealing 30% right renal artery stenosis and moderate tortuosity of the  aortic iliacs.   Dr. Gerri Spore recommended medical therapy and a followup stress Myoview.   At time of  discharge, the patient was started on Zocor 40 daily.  A fasting  lipid profile will be ordered at time of outpatient stress test.   DISCHARGE MEDICATIONS:  1.  Coated aspirin 81 mg daily.  2.  Zocor 40 mg q.h.s.  (new).  3.  Cozaar 100 mg daily.  4.  Cardura 80 mg daily.  5.  Prilosec as directed.  6.  Celebrex as previously directed.  7.  Nitrostat 0.4 mg as directed.   INSTRUCTIONS:  1.  Followup exercise stress Myoview on Tuesday, June 6 at 8 a.m. The      patient will have a fasting lipid profile drawn that morning.  2.  The patient will follow up with Dr. Valera Castle on Tuesday, June 14 at      2:45 p.m.   DISCHARGE DIAGNOSES:  1.  Moderate coronary artery disease.      1.  Normal serial cardiac enzymes.      2.  Moderate two-vessel coronary artery disease by cardiac          catheterization May 20, 2005.      3.  Normal left ventricular function.  2.  History of hypertension.  3.  Tobacco.  4.  Obesity.  5.  Gastroesophageal reflux disease.       GS/MEDQ  D:  05/20/2005  T:  05/20/2005  Job:  [id number]   cc:   Kingsley Callander. Ouida Sills, MD  [address]  [place]  [place] [address]  Fax: [phone]

## 2011-05-07 NOTE — Discharge Summary (Signed)
[name], [name]             ACCOUNT NO.:  [id number]   MEDICAL RECORD NO.:  [id number]          PATIENT TYPE:  INP   LOCATION:  3002                         FACILITY:  [facility]   PHYSICIAN:  Stefani Dama, M.D.  DATE OF BIRTH:  30-Oct-1946   DATE OF ADMISSION:  09/13/2006  DATE OF DISCHARGE:  09/15/2006                                 DISCHARGE SUMMARY   ADMISSION DIAGNOSES:  Lumbar spondylosis of L3-L4 with lumbar radiculopathy,  chronic herniated nucleus pulposa L3-L4.   DISCHARGE AND FINAL DIAGNOSIS:  1. Lumbar spondylosis of L3-L4 with lumbar radiculopathy, chronic herniate      nucleus pulposa L3-L4.  2. Status post arthrodesis L4 to sacrum.   MAJOR OPERATION:  Bilateral discectomy L3-L4, Pedicle screw fixation with  posterior lumbar antibody fusion using T-spacers, local Autograft and  Allograft.   CONDITION ON DISCHARGE:  Improving.   HOSPITAL COURSE:  [name] is a 65 year old individual who has  had a significant history of lumbar spondylotic disease, who has had a  herniated nucleus pulposa and previously at L3-L4 on the right side and  underwent the right lumbar discectomy.  He had good relief of pain, but has  developed problems with increasing pain and right lumbar radiculopathy and  now has advanced spondylotic degeneration at the disc space of L3-L4.  After  careful consideration of his options and failing multiple attempts at  conservative management, he was advised regarding surgical decompression and  stabilization of L3-L4.  Patient has previously undergone decompression  arthrodesis of the L4 to sacrum region secondary to spondylotic disease.  He  underwent the surgery and tolerated it well.  He was ambulating on the first  postoperative day and seemed to be tolerating this well.  He has had some  bleeds in the center portion of the incision and the dressing is being  changed on a p.r.n. basis with a modest amount of drainage being noted that  is red and bloody.  There is no erythema.  The patient did have a low grade  temperature to a maximum of 100.5 degrees on the evening before discharge;  however, at the time of discharge, he is afebrile.  He has been given  prescriptions for Percocet number 60 without refills, Valium 5 mg number 40  without refills.  To be seen in the office in 2 weeks' time for further  followup.   CONDITION ON DISCHARGE:  Improving.      Stefani Dama, M.D.  Electronically Signed     HJE/MEDQ  D:  09/15/2006  T:  09/15/2006  Job:  [id number]

## 2011-05-07 NOTE — Discharge Summary (Signed)
Cody Velez, Cody Velez             ACCOUNT NO.:  [id number]   MEDICAL RECORD NO.:  [id number]          PATIENT TYPE:  OBV   LOCATION:  2039                         FACILITY:  [facility]   PHYSICIAN:  Madolyn Frieze. Jens Som, MD, FACCDATE OF BIRTH:  October 27, 1946   DATE OF ADMISSION:  12/19/2006  DATE OF DISCHARGE:                         DISCHARGE SUMMARY - REFERRING   CONTINUATION   DISCHARGE DIAGNOSES:  1. Near syncope of unknown etiology.  2. Hypokalemia.  History as noted above.  3. Tobacco use.  4. Obesity.   PAST MEDICAL HISTORY:  1. Hypertension for which he does not check at home.  2. Obstructive sleep apnea for which he uses a C-PAP.  3. Hyperlipidemia.  4. Evaluation for chest discomfort with catheterization on May 20, 2005 that showed non-obstructive coronary artery disease, normal LV      function.  He had a stress Myoview to evaluate the non-obstructive      disease on May 25, 2005.  This showed EF of 60%.  No ischemia.      Evaluation for chest discomfort with a catheterization May 20, 2005      that showed non-obstructive coronary artery disease, normal LV      function.  He had a stress Myoview to evaluate the non-obstructive      coronary disease on 05/25/2005.  It showed an EF of 60%.  No      ischemia.  5. History of obesity, remote tobacco use.  Also, he still chews      tobacco.   LABORATORY DATA:  Chest x-ray on the 31st showed no active disease.  Admission weight was 134.2 kg.  Hemoglobin and hematocrit was 16.7 and  49.  Normal indices.  Platelet 327.  WBC 7.6.  PTT 31.  PT 13.8.  Sodium  41.  Potassium 4.1.  BUN 11.  Creatinine 1.1.  Glucose 106.  Hemoglobin  A1C 5.1.  CK-MB, __________ and troponins were all unremarkable times 2.  Fasting lipids showed a total cholesterol 109.  Triglycerides 205.  HDL  28.  LDL 40.  TSH was 0.910.  Stools were noted to be heme positive.  EKG showed normal sinus rhythm without any acute abnormalities.   HOSPITAL COURSE:   Cody Velez was admitted to Unit [address] for  observations.  Respiratory assisted with CPAP.  By December 20, 2006, he  had ruled out for myocardial infarction.  He had no further spells.  Dr.  Tenny Craw felt that the patient could be discharged home with continued  outpatient follow up.  Telemetry did not show any evidence of  dysrhythmias.  Dr. Tenny Craw felt that the office when they call for an event  monitor should consider a Cardio __________ monitor since the patient  travels frequently and then consider a tilt test.   DISPOSITION:  Activity not restricted. He is asked to maintain a heart  healthy diet.   DISCHARGE MEDICATIONS:  1. New prescription for potassium 20 mEq q.d.  2. He is asked to continue HCTZ 25 mg q.d.  3. Aspirin 81 q.d.  4. Cozaar 100 mg q.d.  5.  Celebrex 200 mg b.i.d.  6. Nexium 40 mg q.d.  7. Cardura 4 mg q.d.  8. Simvastatin 40 mg q. h.s.   An echocardiogram will be performed on December 22, 2006 at 3 p.m.  The  office will call him with arrangements for the event monitor.  He will  follow up with Dr. Daleen Squibb on January 09, 2007 at 4:45 in regards to the  echocardiogram and event monitor at that time.  Consideration should be  given to a tilt table if he continues to have symptoms.  The patient was  advised of no tobacco products.  He will need to follow up with Dr.  Ouida Sills for further evaluation about his headaches as well as his heme  positive stools in the setting of normal blood counts.   DISCHARGE TIME:  25 minutes.      Joellyn Rued, PA-C      Madolyn Frieze Jens Som, MD, [facility]  Electronically Signed    EW/MEDQ  D:  12/20/2006  T:  12/20/2006  Job:  [id number]   cc:   Kingsley Callander. Ouida Sills, MD  Jesse Sans Wall, MD, FACC  R. Roetta Sessions, M.D.  Barbaraann Share, MD,FCCP  Stefani Dama, M.D.

## 2011-05-07 NOTE — Op Note (Signed)
Cody Velez, Cody Velez                       ACCOUNT NO.:  [id number]   MEDICAL RECORD NO.:  [id number]                   PATIENT TYPE:  OIB   LOCATION:  3001                                 FACILITY:  [facility]   PHYSICIAN:  Stefani Dama, M.D.               DATE OF BIRTH:  1946-04-24   DATE OF PROCEDURE:  03/01/2003  DATE OF DISCHARGE:  03/02/2003                                 OPERATIVE REPORT   PREOPERATIVE DIAGNOSES:  1. Herniated nucleus pulposus L3-4, right.  2. L3 radiculopathy.   POSTOPERATIVE DIAGNOSES:  1. Herniated nucleus pulposus L3-4, right.  2. L3 radiculopathy.   PROCEDURE:  Extraforaminal microendoscopic diskectomy with METRx system and  operating microscope, microdissection technique.   SURGEON:  Stefani Dama, M.D.   FIRST ASSISTANT:  Kyle L. Franky Macho, M.D.   ANESTHESIA:  General endotracheal.   INDICATIONS:  The patient is a 65 year old individual who has had  significant right severe back and right leg pain for the past several weeks.  The pain has become excruciating and unrelenting.  MRI demonstrated a  foraminal disk herniation at L3-4 on the right side.  The patient was taken  to the operating room to undergo surgical decompression.   DESCRIPTION OF PROCEDURE:  The patient was brought to the operating room,  supine on the stretcher.  After smooth induction of general endotracheal  anesthesia, he was turned prone. The back was shaved, prepped with DuraPrep,  and draped in a sterile fashion.  Then using fluoroscopic localization, a  needle was passed to the L3-L4 interspace.  Using a winding technique then,  dissection of the paraspinous muscles was undertaken until an 18 mm cannula  measuring 7 cm deep could be passed to the pars region of the L3 vertebra.  Once the localization was complete, the fluoroscopy unit was removed.   The operating microscope was draped and brought into the field.  Then  through this 18 mm diameter aperture, the  fascia overlying the pars region  was cleared with the monopolar cautery, and bone was removed from the outer  aspect of the pars as the superior and lateral aspect of the facet.  This  area was skeletonized until the fascia underneath this was identified, and  then using a 2-3 mm Kerrison punch, it was elevated.  Underlying this was  found some epidural fat.  This was resected partially and then, in the  superior aspect, the nerve root was identified.  The inferior aspect of the  disk space could be identified.  There was bowing dorsally of the posterior  longitudinal ligament.  The ligament was noted to be rather tense.  It was  incised in the lateral aspect.   Then using a 2 mm Kerrison punch, some degenerated fragments of disk were  found from within the region of the foraminal portion of the disk.  The disk  was rather tenacious and required gross resection.  Once this was  accomplished, then a combination of Kerrison rongeurs was used to evacuate  the disk space of a moderate amount of this degenerated disk material.  All  the time, care was taken to protect the L3 nerve root in its exit foramen.  The fascia overlying the nerve root was removed so as to allow for better  decompression and easier mobilization of the nerve root itself.  The  interspace was then cleared completely with the 2 and 3 mm Kerrison punch.  We found that the nerve root was well decompressed in its passage over the  disk space laterally and medially, the disk space was also cleared in the  foramen.   The L3 nerve root being well decompressed, the area was copiously irrigated  with antibiotic irrigating solution.  Depo-Medrol 40 mg and 50 mcg of  fentanyl was left over the root.  The retractor was then removed.  The  microscope was removed.  The fascia was closed with a singular 3-0 Vicryl  stitch, and 3-0 Vicryl was used to close the subcuticular tissues.   The patient tolerated the procedure well and was  then returned to the  recovery room in stable condition.                                               Stefani Dama, M.D.    [name]  D:  03/01/2003  T:  03/02/2003  Job:  [id number]

## 2011-05-07 NOTE — Op Note (Signed)
NAMEKYAL, ARTS             ACCOUNT NO.:  [id number]   MEDICAL RECORD NO.:  [id number]          PATIENT TYPE:  INP   LOCATION:  3002                         FACILITY:  [facility]   PHYSICIAN:  Stefani Dama, M.D.  DATE OF BIRTH:  03-23-1946   DATE OF PROCEDURE:  09/13/2006  DATE OF DISCHARGE:                                 OPERATIVE REPORT   PREOPERATIVE DIAGNOSIS:  1. Lumbar spondylosis.  2. Lumbar radiculopathy L3-L4.  3. Status post arthrodesis L4 to sacrum.   POSTOPERATIVE DIAGNOSIS:  1. Lumbar spondylosis.  2. Lumbar radiculopathy L3-L4.  3. Status post arthrodesis L4 to sacrum.   OPERATION:  1. L3-L4 bilateral diskectomy with decompression of L4 nerve roots and L3      nerve roots.  2. Posterior lumbar interbody arthrodesis with PEEK spacers local      autograft and allograft pedicle screw fixation at L3-L4.   SURGEON:  Stefani Dama, M.D.   FIRST ASSISTANT:  Hilda Lias, M.D.   ANESTHESIA:  General endotracheal   INDICATIONS:  Mr. Millan Legan a 65 year old individual who has had  significant spondylitic degeneration at the level of L3-L4 above a solid  arthrodesis at L4 to the sacrum performed via posterior approach with right  cage placement.  The patient notes that he has had progressive deterioration  of his gait over the past 6 months' period; and he has become increasingly  more immobilized.  He is now taken to the operating room to undergo surgical  decompression at the L3-L4 level.   PROCEDURE:  The patient was brought to the operating room supine on the  stretcher.  After smooth induction of general endotracheal anesthesia, Foley  catheter was placed; and he was then placed into the prone position.  The  back was prepped with alcohol and DuraPrep and draped in a sterile fashion.  An elliptical incision was made around his previous scar; and this was  excised.  The superior portion of fascia in the midline fascia above this  area was then  opened down to the lumbodorsal fascia which was opened on  either side of midline to expose the first identifiable spinous process  which was noted to be that of L-2.  Using a subperiosteal dissection  technique, then the interlaminar spaces of L3 and L4 were exposed and  dissection was taken out over the right side to expose the facette joint at  L3-L4 which is markedly overgrown and sclerotic.  A radical laminotomy was  then performed removing the inferior margin of the lamina of L-3 out to the  articulation of the pars with the facette joint; and this exposed the common  dural tube; and then the takeoff of the L3 nerve root superiorly.  The L4-  nerve root was traced in the canal and out into the foramen.  In the lateral  gutter from previous surgery there was a significant epidural fibrosis; and  this was gradually mobilized, so as to release the L3 nerve root.  With this  area being decompressed, the common dural tube was decompressed down to the  region of the L4  nerve root.   Attention was then turned to the L3-4 space on the left side.  This area was  noted be significantly sclerotic; and, again, a radical laminotomy was  performed removing the inferior marginal lamina of L3 out to the medial wall  of the facette; and thus denuding the facette.  Pedicle screws were then  placed in L3 and L4.  These measured 6.5 x 45 mm.  This was done with  fluoroscopic guidance by first placing pedicle probes into the pedicles at  L3 and L4.  With the area isolated, a diskectomy was then performed first on  the left side removing significant quantity of overgrown scar tissue in this  area; and then on the right side where there had previously been evidence of  a diskectomy and decompression was carefully performed so as to release the  common dural tube and the nerve roots.  Then the subligamentous space was  then entered and a significant quantity of severely degenerated disk  material  encountered from the previous procedure was dissected down.  The  nerve root superiorly, namely the L3 nerve root, was mobilized and  decompressed; and then the common dural tube was retracted medially and the  disk space was entered at the L3-L4 space.  A radical diskectomy was then  performed using combination of Kerrison rongeurs to evacuate all the disk  material.  Once the disk space had been completely freed and emptied, from  both the right side and left side, disk shaping cutters were used to shape  the endplate and curettes were used to remove the endplate material also.  Once the endplates were prepared, this area was sized; and it was felt that  an 11-mm spacer would fit nicely into this interval.  The peak spacers were  then packed with a combination of the patient's own bone and some ostia cell  material.  The pedicle screws were then placed in the vertebral bodies via  the transpedicular root at L3 and L4.  These were 6.5 x 35 mm screws.  With  the screws being placed, bone graft material was placed posteriorly and  posterolaterally.  The screws were torqued down to their final position  using 50-mm rods which were neutral position.  The patient tolerated this  procedure quite well in the end; and he was returned to recovery room in  stable condition.      Stefani Dama, M.D.  Electronically Signed     HJE/MEDQ  D:  09/13/2006  T:  09/15/2006  Job:  [id number]

## 2011-05-07 NOTE — Procedures (Signed)
NAMEGLENDAL, CASSADAY             ACCOUNT NO.:  [id number]   MEDICAL RECORD NO.:  [id number]          PATIENT TYPE:  OUT   LOCATION:  RAD                           FACILITY:  [facility]   PHYSICIAN:  Kingsley Callander. Ouida Sills, M.D.     DATE OF BIRTH:  August 31, 1946   DATE OF PROCEDURE:  DATE OF DISCHARGE:  09/08/2004                                    STRESS TEST   HISTORY OF PRESENT ILLNESS:  Mr. Prisk underwent a Cardiolite stress test  for evaluation of chest pain and unexplained diaphoresis.  He exercised 8  minutes and 32 seconds (2 minutes and 32 seconds into stage 3 of the Bruce  protocol), obtaining a maximum heart rate of 160 (99% of the age predicted  maximal heart rate), at a workload at 10.1 mets, and discontinued exercise  due to shortness of breath and knee pain.  There were no symptoms of angina.  There were PVC's during stage 3, including ventricular couplets and  triplets.  There were no ST segment changes diagnostic of ischemia.  His  baseline EKG revealed normal sinus rhythm at 72 beats per minute.  He was  hypertensive initially at 172/80.   IMPRESSION:  1.  No evidence of exercise-induced ischemia.  2.  Ventricular ectopy noted at peak exercise.  3.  Resting hypertension was present.  4.  Cardiolite images are pending.      ROF/MEDQ  D:  09/09/2004  T:  09/10/2004  Job:  [id number]

## 2011-05-07 NOTE — Cardiovascular Report (Signed)
Cody Velez, Cody Velez             ACCOUNT NO.:  [id number]   MEDICAL RECORD NO.:  [id number]          PATIENT TYPE:  INP   LOCATION:  4731                         FACILITY:  [facility]   PHYSICIAN:  Carole Binning, M.D. LHCDATE OF BIRTH:  [birth year]   DATE OF PROCEDURE:  05/20/2005  DATE OF DISCHARGE:                              CARDIAC CATHETERIZATION   PROCEDURE PERFORMED:  Left heart catheterization with coronary angiography,  left ventriculography and abdominal aortography.   INDICATIONS:  The patient is a 65 year old male with cardiac risk factors.  He came in with chest pain that was concerning for possible unstable angina.  He did rule out for myocardial infarction. He was then referred for cardiac  catheterization.   PROCEDURAL NOTE:  A 6-French sheath was placed in the right femoral artery.  Coronary angiography was performed with standard Judkins 6-French catheters.  Left ventriculography and abdominal aortography performed with an angled  pigtail catheter. Contrast was Omnipaque. There were no complications.   RESULTS:   HEMODYNAMICS:  1.  Left ventricular pressure 135/70.  2.  Aortic pressure 140/95. There is no aortic valve gradient.   LEFT VENTRICULOGRAM:  There is normal wall motion. Ejection fraction  estimated at 55-60%. There is no significant mitral regurgitation.   ABDOMINAL AORTOGRAM:  Reveals a 30% stenosis in the right renal artery. The  left renal artery is normal. There is mild to moderate tortuosity of the  distal abdominal aorta and iliac arteries.   CORONARY ARTERIOGRAPHY:  1.  LEFT MAIN:  Normal.  2.  LEFT ANTERIOR DESCENDING ARTERY:  Has a 50-60% stenosis in the mid      vessel. The LAD gives rise to a large first diagonal branch. In a small      lateral branch of the first diagonal there is a 40% stenosis. The LAD      also gives rise to a small second diagonal branch.  3.  LEFT CIRCUMFLEX:  Gives rise to a small first obtuse marginal, large      second obtuse marginal and large third obtuse marginal branch. The      second obtuse marginal branch has a 30% stenosis proximally. The third      obtuse marginal branch has a 50% stenosis proximally.  4.  RIGHT CORONARY:  A relatively small but dominant vessel. There is a 20%      stenosis in the distal right coronary; the distal right coronary gives      rise to a large acute marginal branch, a small posterolateral branch,      and a normal size posterior descending artery.   IMPRESSION:  1.  Normal left ventricular systolic function.  2.  Moderate two-vessel coronary disease of borderline severity. Overall it      does not appear to be angiographically significant.   PLAN:  Would recommend a stress nuclear study, to rule out ischemia in the  distribution of the LAD or circumflex arteries.      MWP/MEDQ  D:  05/20/2005  T:  05/20/2005  Job:  [id number]   cc:   Kingsley Callander. Ouida Sills, MD  [phone]  [address]  [place]  [place] [address]  Fax: [phone]   Jesse Sans. Wall, M.D.

## 2011-05-07 NOTE — Discharge Summary (Signed)
NAMEJAMAURY, GUMZ             ACCOUNT NO.:  [id number]   MEDICAL RECORD NO.:  [id number]          PATIENT TYPE:  OBV   LOCATION:  2039                         FACILITY:  [facility]   PHYSICIAN:  Joellyn Rued, PA-C     DATE OF BIRTH:  10/11/1946   DATE OF ADMISSION:  12/19/2006  DATE OF DISCHARGE:  12/20/2006                         DISCHARGE SUMMARY - REFERRING   SUMMARY OF HISTORY:  Mr. Oats is a 65 year old white male who was  transported via EMS to [facility] emergency room secondary to a I  fell.   On the day of admission, while at work at approximately 7:30, sitting at  a desk talking, he suddenly developed a strange feeling throughout his  body, but particularly in his head followed by a hot flash through the  body. He removed his sweater and looked at the computer screen. He  states that he could differentiate the computer icons. He stated that  all this lasted less than 2 minutes. He drank some water and went  outside to sit down. He then decided to go home. On he way home, while  driving, he states hat he just didn't feel right, and he stopped at a  fire station where his blood pressure was checked and, according to the  patient, was 140/90. They recommended further evaluation at the  emergency room. He was transported via EMS and the report is not  available at the time of this dictation. The patient cannot elaborate on  any of the symptoms; however, he denies palpitations, chest discomfort,  shortness of breath, nausea, vomiting or diaphoresis.   PAST MEDICAL HISTORY:  Notable for headaches with a recent MRI by Dr.  Ouida Sills   Dictation ended at this point.      Joellyn Rued, PA-C     EW/MEDQ  D:  12/20/2006  T:  12/20/2006  Job:  [id number]   cc:   Kingsley Callander. Ouida Sills, MD  Jesse Sans Wall, MD, FACC  R. Roetta Sessions, M.D.  Barbaraann Share, MD,FCCP  Stefani Dama, M.D.

## 2011-05-07 NOTE — Assessment & Plan Note (Signed)
[facility]                            CARDIOLOGY OFFICE NOTE   NAME:Cody Velez, Cody Velez                    MRN:          [id number]  DATE:01/09/2007                            DOB:          21-Sep-1946    Ms. Cody Velez returns today after being admitted to [facility] on  New Years Eve for pre-syncope.   He was found to be hypokalemic.   He has a history of nonobstructive coronary disease by cath May 20, 2005.  He has a history of hypertension.  He has obstructive sleep apnea  and wears CPAP.  He has a history of hyperlipidemia.   His complete workup was negative.  Please refer to the discharge  summary.  He was sent home on a cardiac monitor, which he has called in  1 strip, which was baseline, which was normal sinus rhythm.  He has not  had any other episodes to call in.   He really describes some orthostatic changes.  He has been on doxazosin  for benign prostatic hypertrophy at 4 mg a day for the last couple  years.  He seems to correlate the onset of his symptoms with this  medication when specifically asked.   1. He is also on Cozaar 100 mg a day.  2. Nexium 40 mg a day.  3. Aspirin 1 a day.  4. Simvastatin 40 mg a day.  5. Doxazosin 4 mg a day.  6. Hydrochlorothiazide 25 mg a day.   Blood pressure today is 110/72, pulse is 77 and regular, weight 306.  HEENT:  Normocephalic, atraumatic.  PERRLA.  Extraocular muscles are  intact.  Sclerae injected.  Facial symmetry is normal.  Carotid upstrokes are equal bilaterally without bruits.  No JVD.  Thyroid is not enlarged.  Trachea midline.  LUNGS:  Clear.  HEART:  Regular rate and rhythm with a poorly-appreciated PMI.  ABDOMEN:  Protuberant with good bowel sounds.  EXTREMITIES:  No edema.  Pulses are intact.  NEURO:  Exam is intact.   A 2D echo was obtained in the office December 22, 2006.  It shows an EF of  55-60%.  No evidence of LVH.  Normal wall motion and aortic root in the  upper limits of normal in size.   ASSESSMENT AND PLAN:  I suspect that [name] has a propensity for  orthostatic hypotension.  I have asked him to talk to Dr. Ouida Velez, whom he  has a physical with in the first  week of February, about substituting something different for his  doxazosin.  He may also want to decrease his Cozaar or eliminate his  hydrochlorothiazide.  We will see him back in a year.     Thomas C. Daleen Squibb, MD, [facility]  Electronically Signed    TCW/MedQ  DD: 01/09/2007  DT: 01/09/2007  Job #: [id number]   cc:   Cody Velez. Cody Sills, MD

## 2011-07-19 ENCOUNTER — Encounter: Payer: Self-pay | Admitting: Cardiology

## 2011-07-23 ENCOUNTER — Ambulatory Visit (INDEPENDENT_AMBULATORY_CARE_PROVIDER_SITE_OTHER): Payer: Federal, State, Local not specified - PPO | Admitting: Cardiology

## 2011-07-23 ENCOUNTER — Encounter: Payer: Self-pay | Admitting: Cardiology

## 2011-07-23 VITALS — BP 132/80 | HR 68 | Ht 75.5 in | Wt 297.0 lb

## 2011-07-23 DIAGNOSIS — I2510 Atherosclerotic heart disease of native coronary artery without angina pectoris: Secondary | ICD-10-CM

## 2011-07-23 DIAGNOSIS — I10 Essential (primary) hypertension: Secondary | ICD-10-CM

## 2011-07-23 MED ORDER — LOSARTAN POTASSIUM 50 MG PO TABS: 50.0000 mg | ORAL_TABLET | Freq: Every day | ORAL | Status: DC

## 2011-07-23 NOTE — Progress Notes (Signed)
HPI Mr Cody Velez returns today for evaluation and management of his coronary disease. He is having no angina or ischemic symptoms. He denies orthopnea, PND or edema. He still has some episodes intermittently of feeling lightheaded. It is not necessarily when he first stands up. He says his blood pressure stays around 110/70.  His EKG today shows normal sinus rhythm with a RSR prime in V1. He has a mild left axis deviation. Otherwise normal. Past Medical History  Diagnosis Date  . CAD in native artery   . Dyspnea on exertion   . Hypokalemia   . Rectal bleeding     Past Surgical History  Procedure Date  . Back surgery     x5  . Bilateral knee arthroscopy   . Facial cosmetic surgery   . Left wrist surgery     No family history on file.  History   Social History  . Marital Status: Married    Spouse Name: N/A    Number of Children: N/A  . Years of Education: N/A   Occupational History  . Not on file.   Social History Main Topics  . Smoking status: Former Games developer  . Smokeless tobacco: Former Neurosurgeon    Types: Chew   Comment: quit in 1970   . Alcohol Use: Yes     occasionally   . Drug Use: Not on file  . Sexually Active: Not on file   Other Topics Concern  . 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    Allergies  Allergen Reactions  . Codeine     Current Outpatient Prescriptions  Medication Sig Dispense Refill  . aspirin 81 MG EC tablet Take 81 mg by mouth daily.        Marland Kitchen azelastine (OPTIVAR) 0.05 % ophthalmic solution as directed.        . chlorthalidone (HYGROTON) 25 MG tablet Take 25 mg by mouth daily.        . Coenzyme Q-10 100 MG capsule Take 100 mg by mouth daily.        . cycloSPORINE (RESTASIS) 0.05 % ophthalmic emulsion as directed.        Tery Sanfilippo Calcium (STOOL SOFTENER PO) Take by mouth as needed.        Marland Kitchen esomeprazole (NEXIUM) 40 MG capsule Take 40 mg by mouth daily.        Marland Kitchen  FLUoxetine (PROZAC) 20 MG capsule Take 20 mg by mouth daily.        Marland Kitchen loratadine (CLARITIN) 10 MG tablet Take 10 mg by mouth daily.        Marland Kitchen losartan (COZAAR) 100 MG tablet Take 100 mg by mouth daily.        . Multiple Vitamin (MULTIVITAMIN) tablet Take 1 tablet by mouth daily.        . Omega-3 Fatty Acids (FISH OIL) 1000 MG CAPS Take 1 capsule by mouth daily.        Marland Kitchen oxyCODONE-acetaminophen (PERCOCET) 7.5-325 MG per tablet Take 1 tablet by mouth every 4 (four) hours as needed.        . rosuvastatin (CRESTOR) 5 MG tablet Take 5 mg by mouth at bedtime.          ROS Negative other than HPI.   PE General Appearance: well developed, well nourished in no acute distress, large and obese HEENT: symmetrical face, PERRLA, good dentition  Neck: no JVD, thyromegaly, or adenopathy, trachea midline Chest:  symmetric without deformity Cardiac: PMI non-displaced, RRR, normal S1, S2, no gallop or murmur Lung: clear to ausculation and percussion Vascular: all pulses full without bruits  Abdominal: nondistended, nontender, good bowel sounds, no HSM, no bruits Extremities: no cyanosis, clubbing or edema, no sign of DVT, no varicosities  Skin: normal color, no rashes Neuro: alert and oriented x 3, non-focal Pysch: normal affect Filed Vitals:   07/23/11 1217  BP: 132/80  Pulse: 68  Height: 6' 3.5" (1.918 m)  Weight: 297 lb (134.718 kg)    EKG  Labs and Studies Reviewed.   Lab Results  Component Value Date   WBC 8.2 03/05/2011   HGB 11.5* 03/05/2011   HCT 35.5* 03/05/2011   MCV 92.7 03/05/2011   PLT 260 03/05/2011      Chemistry      Component Value Date/Time   NA 134* 03/05/2011 1920   K 3.2* 03/05/2011 1920   CL 99 03/05/2011 1920   CO2 25 03/05/2011 1920   BUN 10 03/05/2011 1920   CREATININE 1.07 03/05/2011 1920      Component Value Date/Time   CALCIUM 8.8 03/05/2011 1920       No results found for this basename: CHOL   No results found for this basename: HDL   No results found for  this basename: LDLCALC   No results found for this basename: TRIG   No results found for this basename: CHOLHDL   No results found for this basename: HGBA1C   No results found for this basename: ALT, AST, GGT, ALKPHOS, BILITOT   No results found for this basename: TSH

## 2011-07-23 NOTE — Patient Instructions (Addendum)
Your physician has recommended you make the following change in your medication:  Decrease Losartan   Your physician recommends that you schedule a follow-up appointment in: 1 year with Dr. Daleen Squibb Your physician has requested that you regularly monitor and record your blood pressure readings at home. Please use the same machine at the same time of day to check your readings and record them to bring to your follow-up visit.

## 2011-07-23 NOTE — Assessment & Plan Note (Signed)
Pressure may be running low for him. Will decrease his losartan 50 mg a day. He has an appointment with Dr. Ouida Sills about 4 weeks. He will also check his pressure at time with goal being less than 130/80.

## 2011-07-23 NOTE — Assessment & Plan Note (Signed)
Stable. No change in treatment. 

## 2011-08-19 ENCOUNTER — Other Ambulatory Visit: Payer: Self-pay | Admitting: Neurological Surgery

## 2011-08-19 DIAGNOSIS — M545 Low back pain, unspecified: Secondary | ICD-10-CM

## 2011-08-24 ENCOUNTER — Ambulatory Visit
Admission: RE | Admit: 2011-08-24 | Discharge: 2011-08-24 | Disposition: A | Discharge status: Home / Self Care | Payer: Federal, State, Local not specified - PPO | Source: Ambulatory Visit | Attending: Neurological Surgery | Admitting: Neurological Surgery

## 2011-08-24 ENCOUNTER — Other Ambulatory Visit: Payer: Self-pay | Admitting: Neurological Surgery

## 2011-08-24 DIAGNOSIS — M545 Low back pain, unspecified: Secondary | ICD-10-CM

## 2011-09-08 ENCOUNTER — Ambulatory Visit (INDEPENDENT_AMBULATORY_CARE_PROVIDER_SITE_OTHER): Payer: Federal, State, Local not specified - PPO | Admitting: Pulmonary Disease

## 2011-09-08 ENCOUNTER — Encounter: Payer: Self-pay | Admitting: Pulmonary Disease

## 2011-09-08 VITALS — BP 118/78 | HR 79 | Temp 97.8°F | Ht 75.0 in | Wt 302.8 lb

## 2011-09-08 DIAGNOSIS — G4733 Obstructive sleep apnea (adult) (pediatric): Secondary | ICD-10-CM

## 2011-09-08 NOTE — Progress Notes (Signed)
Subjective:    Patient ID: Cody Velez, male    DOB: 1946/01/23, 65 y.o.   MRN: [id number]  HPI The patient is a 65 year old male who I've been asked to see for management of obstructive sleep apnea.  The patient was last seen in 2002, where he was diagnosed with severe sleep apnea.  He had an AHI of 45 events per hour, and was started on CPAP for treatment.  I have not seen the patient since, and he has never had his pressure optimized.  He wears his CPAP every night compliantly, and feels fairly rested in the mornings upon arising.  He has had some increased nocturia, but unclear if due to his sleep apnea.  He feels that his alertness during the day is excellent, even during quiet times.  However, does note sleep pressure in the early evenings with any period of inactivity.  He denies any sleepiness with driving.  He has been using nasal pillows with his CPAP, but has been unable to keep his mouth completely closed even with a chin strap.  Note, his weight is down about 30 pounds since March of this year, and his Epworth score today is 6.  His current machine is 69-8 years old per his history.  Sleep Questionnaire: What time do you typically go to bed?( Between what hours) 10 pm How long does it take you to fall asleep? usually 45 mins How many times during the night do you wake up? 3 What time do you get out of bed to start your day? 0530 Do you drive or operate heavy machinery in your occupation? No How much has your weight changed (up or down) over the past two years? (In pounds) 28 lb (12.701 kg) Have you ever had a sleep study before? Yes If yes, location of study? [facility] If yes, date of study? 2002 Do you currently use CPAP? Yes If so, what pressure? unsure Do you wear oxygen at any time? No    Review of Systems  Constitutional: Positive for appetite change and unexpected weight change. Negative for fever.  HENT: Positive for sneezing. Negative for ear pain, nosebleeds, congestion, sore  throat, rhinorrhea, trouble swallowing, dental problem, postnasal drip and sinus pressure.   Eyes: Negative for redness and itching.  Respiratory: Positive for shortness of breath. Negative for cough, chest tightness and wheezing.   Cardiovascular: Negative for palpitations and leg swelling.  Gastrointestinal: Negative for nausea and vomiting.  Genitourinary: Negative for dysuria.  Musculoskeletal: Positive for joint swelling.  Skin: Negative for rash.  Neurological: Negative for headaches.  Hematological: Does not bruise/bleed easily.  Psychiatric/Behavioral: Positive for dysphoric mood. The patient is not nervous/anxious.        Objective:   Physical Exam Constitutional: obese male, no acute distress  HENT:  Nares patent without discharge  Oropharynx without exudate, palate and uvula are elongated and thick  Eyes:  Perrla, eomi, no scleral icterus  Neck:  No JVD, no TMG  Cardiovascular:  Normal rate, regular rhythm, no rubs or gallops.  No murmurs        Intact distal pulses  Pulmonary :  Normal breath sounds, no stridor or respiratory distress   No rales, rhonchi, or wheezing  Abdominal:  Soft, nondistended, bowel sounds present.  No tenderness noted.   Musculoskeletal:  No significant lower extremity edema noted.  Lymph Nodes:  No cervical lymphadenopathy noted  Skin:  No cyanosis noted  Neurologic:  Alert, appropriate, moves all 4 extremities without obvious  deficit.         Assessment & Plan:

## 2011-09-08 NOTE — Patient Instructions (Signed)
Will try full face mask to deal with mouth opening. Will optimize pressure on auto machine for 2 weeks, and will call you with results once I receive the download. Continue to work on weight loss followup with me in one year.

## 2011-09-08 NOTE — Assessment & Plan Note (Signed)
The patient has known severe obstructive sleep apnea, and has been wearing his CPAP compliantly since initial diagnosis in 2002.  However, he never returned for followup, and therefore never had his pressure optimized.  He is using nasal pillows, but continues to have mouth opening.  Overall, he feels he is doing fairly well with CPAP.  At this point, I would like to optimize his pressure for him, and I have also encouraged him to try a full face mask.  A lot of the newer masks are fairly comfortable.  I have also encouraged him to work on weight loss. Care Plan:  At this point, will arrange for the patient's machine to be changed over to auto mode for 2 weeks to optimize their pressure.  I will review the downloaded data once sent by dme, and also evaluate for compliance, leaks, and residual osa.  I will call the patient and dme to discuss the results, and have the patient's machine set appropriately.  This will serve as the pt's cpap pressure titration.

## 2011-12-30 ENCOUNTER — Other Ambulatory Visit: Payer: Self-pay | Admitting: Neurological Surgery

## 2011-12-30 ENCOUNTER — Other Ambulatory Visit (HOSPITAL_COMMUNITY): Payer: Self-pay | Admitting: Neurological Surgery

## 2011-12-30 DIAGNOSIS — M4306 Spondylolysis, lumbar region: Secondary | ICD-10-CM

## 2012-01-06 ENCOUNTER — Ambulatory Visit (HOSPITAL_COMMUNITY)
Admission: RE | Admit: 2012-01-06 | Discharge: 2012-01-06 | Disposition: A | Discharge status: Home / Self Care | Payer: Federal, State, Local not specified - PPO | Source: Ambulatory Visit | Attending: Neurological Surgery | Admitting: Neurological Surgery

## 2012-01-06 DIAGNOSIS — M4306 Spondylolysis, lumbar region: Secondary | ICD-10-CM

## 2012-01-06 DIAGNOSIS — M47817 Spondylosis without myelopathy or radiculopathy, lumbosacral region: Secondary | ICD-10-CM | POA: Insufficient documentation

## 2012-01-06 DIAGNOSIS — M79609 Pain in unspecified limb: Secondary | ICD-10-CM | POA: Insufficient documentation

## 2012-01-06 DIAGNOSIS — R209 Unspecified disturbances of skin sensation: Secondary | ICD-10-CM | POA: Insufficient documentation

## 2012-01-06 DIAGNOSIS — M545 Low back pain, unspecified: Secondary | ICD-10-CM | POA: Insufficient documentation

## 2012-01-06 MED ORDER — OXYCODONE-ACETAMINOPHEN 5-325 MG PO TABS: 1.0000 | ORAL_TABLET | ORAL | Status: DC | PRN

## 2012-01-06 MED ORDER — ONDANSETRON HCL 4 MG/2ML IJ SOLN: 4.0000 mg | Freq: Four times a day (QID) | INTRAMUSCULAR | Status: DC | PRN

## 2012-01-06 MED ORDER — DIAZEPAM 5 MG PO TABS
10.0000 mg | ORAL_TABLET | Freq: Once | ORAL | Status: AC
  Administered 2012-01-06: 10 mg via ORAL
  Filled 2012-01-06: qty 2

## 2012-01-06 MED ORDER — IOHEXOL 180 MG/ML  SOLN
20.0000 mL | Freq: Once | INTRAMUSCULAR | Status: AC | PRN
  Administered 2012-01-06: 20 mL via INTRAVENOUS

## 2012-01-06 NOTE — Procedures (Signed)
Cody Velez. Kirker  #[id number] DOB:  1946-04-19 12/29/2011:     Michele Mcalpine returns to the office today.  It has been over 100 days since he has been using the external bone stimulator.  He does feel that it has had some beneficial effect but overall he feels that his level of function has not improved significantly.  He finds that he gets significant pain in his back and particularly gets a sharp, shooting radicular type of pain off to the left side that will occasionally radiate down into the left lower extremity.  He feels that the fusion stimulator is helping and that there are brief periods of time when it feels somewhat better with almost no pain but then some subtle activity will tend to flare the pain substantially in his low back.    We previously did a CT scan in September that demonstrated a pseudoarthrosis and see whether a fusion is forming and particularly whether he has any evidence of impingement along his left-sided nerve roots would require a myelogram and a CT scan to evaluate this process further.  At this point, he notes that he is quite depressed, walking with a cane with a minimum of activity noting that he feels worse now than when he did before the previous surgery.  In that regard, I believe that a myelogram and post myelogram CT scan would be beneficial to see what anatomic changes have occurred and make some plans if need be for revision or intervention if necessary.         Stefani Dama, M.D./gde Pre op UE:[id number] spondylosis Post op JX:[id number] Spondylosis Procedure:Lumbar Myelogram Surgeon:Guilianna Mckoy Puncture level:L3-4 Fluid color:clear Injection:Iohexol 180 16cc Findings:capacious canalspondylosis l1 ?pseudoarthrosis

## 2012-01-07 MED ORDER — IOHEXOL 180 MG/ML  SOLN: 20.0000 mL | Freq: Once | INTRAMUSCULAR | Status: AC | PRN

## 2012-01-10 ENCOUNTER — Other Ambulatory Visit: Payer: Self-pay | Admitting: Neurological Surgery

## 2012-01-10 DIAGNOSIS — M47814 Spondylosis without myelopathy or radiculopathy, thoracic region: Secondary | ICD-10-CM

## 2012-01-12 ENCOUNTER — Ambulatory Visit
Admission: RE | Admit: 2012-01-12 | Discharge: 2012-01-12 | Disposition: A | Discharge status: Home / Self Care | Payer: Federal, State, Local not specified - PPO | Source: Ambulatory Visit | Attending: Neurological Surgery | Admitting: Neurological Surgery

## 2012-01-12 DIAGNOSIS — M47814 Spondylosis without myelopathy or radiculopathy, thoracic region: Secondary | ICD-10-CM

## 2012-01-12 MED ORDER — GADOBENATE DIMEGLUMINE 529 MG/ML IV SOLN
20.0000 mL | Freq: Once | INTRAVENOUS | Status: AC | PRN
  Administered 2012-01-12: 20 mL via INTRAVENOUS

## 2012-01-16 ENCOUNTER — Other Ambulatory Visit: Payer: Self-pay | Admitting: Pulmonary Disease

## 2012-01-16 DIAGNOSIS — G4733 Obstructive sleep apnea (adult) (pediatric): Secondary | ICD-10-CM

## 2012-01-24 ENCOUNTER — Other Ambulatory Visit: Payer: Self-pay | Admitting: Neurological Surgery

## 2012-02-07 ENCOUNTER — Encounter (HOSPITAL_COMMUNITY)
Admission: RE | Admit: 2012-02-07 | Discharge: 2012-02-07 | Disposition: A | Discharge status: Home / Self Care | Payer: Federal, State, Local not specified - PPO | Source: Ambulatory Visit | Attending: Neurological Surgery | Admitting: Neurological Surgery

## 2012-02-07 ENCOUNTER — Encounter (HOSPITAL_COMMUNITY): Payer: Self-pay

## 2012-02-07 HISTORY — DX: Depression, unspecified: F32.A

## 2012-02-07 HISTORY — DX: Major depressive disorder, single episode, unspecified: F32.9

## 2012-02-07 LAB — BASIC METABOLIC PANEL
BUN: 10 mg/dL (ref 6–23)
CO2: 28 mEq/L (ref 19–32)
Calcium: 10.1 mg/dL (ref 8.4–10.5)
Chloride: 102 mEq/L (ref 96–112)
Creatinine, Ser: 1.04 mg/dL (ref 0.50–1.35)
GFR calc Af Amer: 85 mL/min — ABNORMAL LOW (ref >90)
GFR calc non Af Amer: 73 mL/min — ABNORMAL LOW (ref >90)
Glucose, Bld: 78 mg/dL (ref 70–99)
Potassium: 3.9 mEq/L (ref 3.5–5.1)
Sodium: 140 mEq/L (ref 135–145)

## 2012-02-07 LAB — CBC
HCT: 44.9 % (ref 39.0–52.0)
Hemoglobin: 14.7 g/dL (ref 13.0–17.0)
MCH: 29 pg (ref 26.0–34.0)
MCHC: 32.7 g/dL (ref 30.0–36.0)
MCV: 88.6 fL (ref 78.0–100.0)
Platelets: 313 10*3/uL (ref 150–400)
RBC: 5.07 MIL/uL (ref 4.22–5.81)
RDW: 14.5 % (ref 11.5–15.5)
WBC: 6.3 10*3/uL (ref 4.0–10.5)

## 2012-02-07 LAB — TYPE AND SCREEN
ABO/RH(D): O NEG
Antibody Screen: NEGATIVE

## 2012-02-07 LAB — SURGICAL PCR SCREEN
MRSA, PCR: NEGATIVE
Staphylococcus aureus: NEGATIVE

## 2012-02-07 NOTE — Pre-Procedure Instructions (Signed)
20 Cody Velez  02/07/2012   Your procedure is scheduled on:  FEB 25  Report to [facility] at 0530 AM.  Call this number if you have problems the morning of surgery: [phone]   Remember:   Do not eat food:After Midnight.  May have clear liquids: up to 4 Hours before arrival.0130 AM Clear liquids include soda, tea, black coffee, apple or grape juice, broth.  Take these medicines the morning of surgery with A SIP OF WATER: NEXIUM,FLUOXETINE,OXYCODONE. STOP FISH OIL ,ASPIRIN,HERBAL MEDICINES.   Do not wear jewelry, make-up or nail polish.  Do not wear lotions, powders, or perfumes. You may wear deodorant.  Do not shave 48 hours prior to surgery.  Do not bring valuables to the hospital.  Contacts, dentures or bridgework may not be worn into surgery.  Leave suitcase in the car. After surgery it may be brought to your room.  For patients admitted to the hospital, checkout time is 11:00 AM the day of discharge.   Patients discharged the day of surgery will not be allowed to drive home.  Name and phone number of your driver: FAMILY  Special Instructions: CHG Shower Use Special Wash: 1/2 bottle night before surgery and 1/2 bottle morning of surgery.   Please read over the following fact sheets that you were given: Blood Transfusion Information, MRSA Information and Surgical Site Infection Prevention

## 2012-02-08 NOTE — Consult Note (Signed)
Anesthesia:  Patient is a 66 year old male scheduled for a L1-2 revisionh arthrodesis with iliac crest bone graft on 02/14/12.  History includes CAD, severe OSA with CPAP use (Dr. Shelle Iron), depression, non-smoker, DOE, asthma, HLD, HTN.  His Cardiologist is Dr. Daleen Squibb whom he last saw in August 2012 and listed his CAD as "stable".  EKG on 07/23/11 showed NSR, LAD, incomplete right BBB which was probably not significantly changed since 12/20/06.  In June of 2006 he underwent cardiac catheterization which showed: 1. Normal left ventricular systolic function.  2. Moderate two-vessel coronary disease of borderline severity. Overall it did not appear to be angiographically significant, but a stress nuclear study was recommended to rule out ischemia in the LAD/CX distribution.  He has had multiple stress tests done since, most recently on 05/03/08 showing no significant ischemia or scar.  (Results found within Dr. Vern Claude note from 07/22/10.)  Echo on 12/22/06 showed: - Overall left ventricular systolic function was normal. Left ventricular ejection fraction was estimated , range being 55 % to 60 %. There were no left ventricular regional wall motion abnormalities. - The aortic root was at the upper limits of normal in size.  CXR on 04/21/11 showed no acute findings.  Labs noted.    Anticipate he can proceed to the OR as planned.

## 2012-02-13 MED ORDER — CEFAZOLIN SODIUM-DEXTROSE 2-3 GM-% IV SOLR
2.0000 g | INTRAVENOUS | Status: AC
  Administered 2012-02-14: 2 g via INTRAVENOUS
  Filled 2012-02-13: qty 50

## 2012-02-14 ENCOUNTER — Encounter (HOSPITAL_COMMUNITY)
Admission: RE | Disposition: A | Discharge status: Home / Self Care | Payer: Self-pay | Source: Ambulatory Visit | Attending: Neurological Surgery

## 2012-02-14 ENCOUNTER — Encounter (HOSPITAL_COMMUNITY): Payer: Self-pay

## 2012-02-14 ENCOUNTER — Ambulatory Visit (HOSPITAL_COMMUNITY): Payer: Federal, State, Local not specified - PPO

## 2012-02-14 ENCOUNTER — Encounter (HOSPITAL_COMMUNITY): Payer: Self-pay | Admitting: Vascular Surgery

## 2012-02-14 ENCOUNTER — Encounter (HOSPITAL_COMMUNITY): Payer: Self-pay | Admitting: General Practice

## 2012-02-14 ENCOUNTER — Ambulatory Visit (HOSPITAL_COMMUNITY): Payer: Federal, State, Local not specified - PPO | Admitting: Vascular Surgery

## 2012-02-14 ENCOUNTER — Inpatient Hospital Stay (HOSPITAL_COMMUNITY)
Admission: RE | Admit: 2012-02-14 | Discharge: 2012-02-15 | DRG: 756 | Disposition: A | Discharge status: Home / Self Care | Payer: Federal, State, Local not specified - PPO | Source: Ambulatory Visit | Attending: Neurological Surgery | Admitting: Neurological Surgery

## 2012-02-14 DIAGNOSIS — Y831 Surgical operation with implant of artificial internal device as the cause of abnormal reaction of the patient, or of later complication, without mention of misadventure at the time of the procedure: Secondary | ICD-10-CM | POA: Diagnosis present

## 2012-02-14 DIAGNOSIS — Z01812 Encounter for preprocedural laboratory examination: Secondary | ICD-10-CM

## 2012-02-14 DIAGNOSIS — S32009K Unspecified fracture of unspecified lumbar vertebra, subsequent encounter for fracture with nonunion: Secondary | ICD-10-CM

## 2012-02-14 DIAGNOSIS — T84498A Other mechanical complication of other internal orthopedic devices, implants and grafts, initial encounter: Principal | ICD-10-CM | POA: Diagnosis present

## 2012-02-14 DIAGNOSIS — E669 Obesity, unspecified: Secondary | ICD-10-CM | POA: Diagnosis present

## 2012-02-14 HISTORY — PX: BACK SURGERY: SHX140

## 2012-02-14 HISTORY — DX: Unspecified osteoarthritis, unspecified site: M19.90

## 2012-02-14 HISTORY — DX: Gastro-esophageal reflux disease without esophagitis: K21.9

## 2012-02-14 HISTORY — DX: Pneumonia, unspecified organism: J18.9

## 2012-02-14 SURGERY — POSTERIOR LUMBAR FUSION 1 LEVEL
Anesthesia: General | Site: Back | Wound class: Clean

## 2012-02-14 MED ORDER — METHOCARBAMOL 500 MG PO TABS
750.0000 mg | ORAL_TABLET | Freq: Four times a day (QID) | ORAL | Status: DC | PRN
  Administered 2012-02-15: 750 mg via ORAL
  Filled 2012-02-14: qty 2

## 2012-02-14 MED ORDER — LORATADINE 10 MG PO TABS
10.0000 mg | ORAL_TABLET | Freq: Every day | ORAL | Status: DC
  Administered 2012-02-15: 10 mg via ORAL
  Filled 2012-02-14 (×2): qty 1

## 2012-02-14 MED ORDER — SODIUM CHLORIDE 0.9 % IJ SOLN
3.0000 mL | Freq: Two times a day (BID) | INTRAMUSCULAR | Status: DC
  Administered 2012-02-14 – 2012-02-15 (×2): 3 mL via INTRAVENOUS

## 2012-02-14 MED ORDER — HEMOSTATIC AGENTS (NO CHARGE) OPTIME
TOPICAL | Status: DC | PRN
  Administered 2012-02-14: 1 via TOPICAL

## 2012-02-14 MED ORDER — PHENYLEPHRINE HCL 10 MG/ML IJ SOLN
10.0000 mg | INTRAMUSCULAR | Status: DC | PRN
  Administered 2012-02-14: 20 ug/min via INTRAVENOUS

## 2012-02-14 MED ORDER — FLUTICASONE PROPIONATE 50 MCG/ACT NA SUSP
2.0000 | Freq: Every day | NASAL | Status: DC
  Administered 2012-02-15: 2 via NASAL
  Filled 2012-02-14: qty 16

## 2012-02-14 MED ORDER — BUPIVACAINE HCL (PF) 0.5 % IJ SOLN
INTRAMUSCULAR | Status: DC | PRN
  Administered 2012-02-14: 5 mL

## 2012-02-14 MED ORDER — SODIUM CHLORIDE 0.9 % IJ SOLN: 9.0000 mL | INTRAMUSCULAR | Status: DC | PRN

## 2012-02-14 MED ORDER — MORPHINE SULFATE 4 MG/ML IJ SOLN: 1.0000 mg | INTRAMUSCULAR | Status: DC | PRN

## 2012-02-14 MED ORDER — HYDROMORPHONE HCL PF 1 MG/ML IJ SOLN
0.2500 mg | INTRAMUSCULAR | Status: DC | PRN
Start: 2012-02-14 — End: 2012-02-14

## 2012-02-14 MED ORDER — MORPHINE SULFATE (PF) 1 MG/ML IV SOLN
INTRAVENOUS | Status: AC
  Filled 2012-02-14: qty 25

## 2012-02-14 MED ORDER — PROMETHAZINE HCL 25 MG/ML IJ SOLN: 6.2500 mg | INTRAMUSCULAR | Status: DC | PRN

## 2012-02-14 MED ORDER — GLYCOPYRROLATE 0.2 MG/ML IJ SOLN
INTRAMUSCULAR | Status: DC | PRN
  Administered 2012-02-14: .4 mg via INTRAVENOUS

## 2012-02-14 MED ORDER — PANTOPRAZOLE SODIUM 40 MG PO TBEC
40.0000 mg | DELAYED_RELEASE_TABLET | Freq: Every day | ORAL | Status: DC
  Administered 2012-02-15: 40 mg via ORAL
  Filled 2012-02-14: qty 1

## 2012-02-14 MED ORDER — ONDANSETRON HCL 4 MG/2ML IJ SOLN: 4.0000 mg | Freq: Four times a day (QID) | INTRAMUSCULAR | Status: DC | PRN

## 2012-02-14 MED ORDER — NALOXONE HCL 0.4 MG/ML IJ SOLN: 0.4000 mg | INTRAMUSCULAR | Status: DC | PRN

## 2012-02-14 MED ORDER — ONDANSETRON HCL 4 MG/2ML IJ SOLN
INTRAMUSCULAR | Status: DC | PRN
  Administered 2012-02-14: 4 mg via INTRAVENOUS

## 2012-02-14 MED ORDER — BACITRACIN 50000 UNITS IM SOLR
INTRAMUSCULAR | Status: AC
  Filled 2012-02-14: qty 1

## 2012-02-14 MED ORDER — ROCURONIUM BROMIDE 100 MG/10ML IV SOLN
INTRAVENOUS | Status: DC | PRN
  Administered 2012-02-14: 10 mg via INTRAVENOUS
  Administered 2012-02-14: 15 mg via INTRAVENOUS
  Administered 2012-02-14: 65 mg via INTRAVENOUS

## 2012-02-14 MED ORDER — LIDOCAINE-EPINEPHRINE 1 %-1:100000 IJ SOLN
INTRAMUSCULAR | Status: DC | PRN
  Administered 2012-02-14: 5 mL

## 2012-02-14 MED ORDER — METHOCARBAMOL 100 MG/ML IJ SOLN
500.0000 mg | Freq: Four times a day (QID) | INTRAVENOUS | Status: DC | PRN
  Filled 2012-02-14: qty 5

## 2012-02-14 MED ORDER — DIPHENHYDRAMINE HCL 12.5 MG/5ML PO ELIX: 12.5000 mg | ORAL_SOLUTION | Freq: Four times a day (QID) | ORAL | Status: DC | PRN

## 2012-02-14 MED ORDER — LACTATED RINGERS IV SOLN
INTRAVENOUS | Status: DC | PRN
  Administered 2012-02-14 (×3): via INTRAVENOUS

## 2012-02-14 MED ORDER — PHENOL 1.4 % MT LIQD: 1.0000 | OROMUCOSAL | Status: DC | PRN

## 2012-02-14 MED ORDER — MIDAZOLAM HCL 5 MG/5ML IJ SOLN
INTRAMUSCULAR | Status: DC | PRN
  Administered 2012-02-14: 2 mg via INTRAVENOUS

## 2012-02-14 MED ORDER — ACETAMINOPHEN 650 MG RE SUPP: 650.0000 mg | RECTAL | Status: DC | PRN

## 2012-02-14 MED ORDER — CHLORTHALIDONE 25 MG PO TABS
25.0000 mg | ORAL_TABLET | Freq: Every day | ORAL | Status: DC
  Administered 2012-02-14: 25 mg via ORAL
  Filled 2012-02-14 (×2): qty 1

## 2012-02-14 MED ORDER — OXYCODONE-ACETAMINOPHEN 5-325 MG PO TABS
1.0000 | ORAL_TABLET | ORAL | Status: DC | PRN
  Administered 2012-02-15: 2 via ORAL
  Filled 2012-02-14: qty 2

## 2012-02-14 MED ORDER — ONDANSETRON HCL 4 MG/2ML IJ SOLN: 4.0000 mg | INTRAMUSCULAR | Status: DC | PRN

## 2012-02-14 MED ORDER — DEXTROSE 5 % IV SOLN
INTRAVENOUS | Status: DC | PRN
  Administered 2012-02-14: 08:00:00 via INTRAVENOUS

## 2012-02-14 MED ORDER — DEXAMETHASONE SODIUM PHOSPHATE 4 MG/ML IJ SOLN
INTRAMUSCULAR | Status: DC | PRN
  Administered 2012-02-14: 8 mg via INTRAVENOUS

## 2012-02-14 MED ORDER — PROPOFOL 10 MG/ML IV EMUL
INTRAVENOUS | Status: DC | PRN
  Administered 2012-02-14: 200 mg via INTRAVENOUS

## 2012-02-14 MED ORDER — MORPHINE SULFATE (PF) 1 MG/ML IV SOLN
INTRAVENOUS | Status: DC
  Administered 2012-02-14: 6 mg via INTRAVENOUS
  Administered 2012-02-14: 14:00:00 via INTRAVENOUS

## 2012-02-14 MED ORDER — 0.9 % SODIUM CHLORIDE (POUR BTL) OPTIME
TOPICAL | Status: DC | PRN
  Administered 2012-02-14: 1000 mL

## 2012-02-14 MED ORDER — LACTATED RINGERS IV SOLN: INTRAVENOUS | Status: DC

## 2012-02-14 MED ORDER — POTASSIUM CHLORIDE CRYS ER 20 MEQ PO TBCR
20.0000 meq | EXTENDED_RELEASE_TABLET | Freq: Two times a day (BID) | ORAL | Status: DC
  Administered 2012-02-14 – 2012-02-15 (×2): 20 meq via ORAL
  Filled 2012-02-14 (×3): qty 1

## 2012-02-14 MED ORDER — DOCUSATE SODIUM 100 MG PO CAPS
100.0000 mg | ORAL_CAPSULE | Freq: Every day | ORAL | Status: DC
  Administered 2012-02-14 – 2012-02-15 (×2): 100 mg via ORAL
  Filled 2012-02-14: qty 1

## 2012-02-14 MED ORDER — LOSARTAN POTASSIUM 50 MG PO TABS
50.0000 mg | ORAL_TABLET | Freq: Every day | ORAL | Status: DC
  Administered 2012-02-15: 50 mg via ORAL
  Filled 2012-02-14 (×2): qty 1

## 2012-02-14 MED ORDER — SODIUM CHLORIDE 0.9 % IJ SOLN: 3.0000 mL | INTRAMUSCULAR | Status: DC | PRN

## 2012-02-14 MED ORDER — ADULT MULTIVITAMIN W/MINERALS CH
1.0000 | ORAL_TABLET | Freq: Every day | ORAL | Status: DC
  Administered 2012-02-15: 1 via ORAL
  Filled 2012-02-14 (×2): qty 1

## 2012-02-14 MED ORDER — MENTHOL 3 MG MT LOZG: 1.0000 | LOZENGE | OROMUCOSAL | Status: DC | PRN

## 2012-02-14 MED ORDER — ACETAMINOPHEN 325 MG PO TABS: 650.0000 mg | ORAL_TABLET | ORAL | Status: DC | PRN

## 2012-02-14 MED ORDER — SODIUM CHLORIDE 0.9 % IR SOLN
Status: DC | PRN
  Administered 2012-02-14: 07:00:00

## 2012-02-14 MED ORDER — CEFAZOLIN SODIUM 1-5 GM-% IV SOLN
1.0000 g | Freq: Three times a day (TID) | INTRAVENOUS | Status: AC
  Administered 2012-02-14 – 2012-02-15 (×2): 1 g via INTRAVENOUS
  Filled 2012-02-14 (×2): qty 50

## 2012-02-14 MED ORDER — ALUM & MAG HYDROXIDE-SIMETH 200-200-20 MG/5ML PO SUSP: 30.0000 mL | Freq: Four times a day (QID) | ORAL | Status: DC | PRN

## 2012-02-14 MED ORDER — METHOCARBAMOL 500 MG PO TABS: 750.0000 mg | ORAL_TABLET | Freq: Three times a day (TID) | ORAL | Status: DC | PRN

## 2012-02-14 MED ORDER — POTASSIUM CHLORIDE 20 MEQ PO PACK
20.0000 meq | PACK | Freq: Two times a day (BID) | ORAL | Status: DC
Start: 2012-02-14 — End: 2012-02-14

## 2012-02-14 MED ORDER — SODIUM CHLORIDE 0.9 % IV SOLN: 250.0000 mL | INTRAVENOUS | Status: DC

## 2012-02-14 MED ORDER — FLUOXETINE HCL 20 MG PO CAPS
40.0000 mg | ORAL_CAPSULE | Freq: Every day | ORAL | Status: DC
  Administered 2012-02-15: 40 mg via ORAL
  Filled 2012-02-14 (×2): qty 2

## 2012-02-14 MED ORDER — PHENYLEPHRINE HCL 10 MG/ML IJ SOLN
INTRAMUSCULAR | Status: DC | PRN
  Administered 2012-02-14 (×2): 80 ug via INTRAVENOUS

## 2012-02-14 MED ORDER — DIPHENHYDRAMINE HCL 50 MG/ML IJ SOLN: 12.5000 mg | Freq: Four times a day (QID) | INTRAMUSCULAR | Status: DC | PRN

## 2012-02-14 MED ORDER — FENTANYL CITRATE 0.05 MG/ML IJ SOLN
INTRAMUSCULAR | Status: DC | PRN
  Administered 2012-02-14: 250 ug via INTRAVENOUS
  Administered 2012-02-14: 100 ug via INTRAVENOUS

## 2012-02-14 MED ORDER — SODIUM CHLORIDE 0.9 % IV SOLN
INTRAVENOUS | Status: DC | PRN
  Administered 2012-02-14: 11:00:00 via INTRAVENOUS

## 2012-02-14 MED ORDER — SODIUM CHLORIDE 0.9 % IV SOLN
INTRAVENOUS | Status: AC
  Filled 2012-02-14: qty 500

## 2012-02-14 MED ORDER — NEOSTIGMINE METHYLSULFATE 1 MG/ML IJ SOLN
INTRAMUSCULAR | Status: DC | PRN
  Administered 2012-02-14: 3 mg via INTRAVENOUS

## 2012-02-14 MED ORDER — SODIUM CHLORIDE 0.9 % IV SOLN
INTRAVENOUS | Status: DC
  Administered 2012-02-14: 15:00:00 via INTRAVENOUS

## 2012-02-14 MED ORDER — THROMBIN 20000 UNITS EX KIT
PACK | OROMUCOSAL | Status: DC | PRN
  Administered 2012-02-14: 07:00:00 via TOPICAL

## 2012-02-14 SURGICAL SUPPLY — 56 items
ADH SKN CLS APL DERMABOND .7 (GAUZE/BANDAGES/DRESSINGS) ×3
BAG DECANTER FOR FLEXI CONT (MISCELLANEOUS) ×2 IMPLANT
BLADE SURG ROTATE 9660 (MISCELLANEOUS) IMPLANT
BUR MATCHSTICK NEURO 3.0 LAGG (BURR) ×2 IMPLANT
CANISTER SUCTION 2500CC (MISCELLANEOUS) ×2 IMPLANT
CLOTH BEACON ORANGE TIMEOUT ST (SAFETY) ×2 IMPLANT
CONT SPEC 4OZ CLIKSEAL STRL BL (MISCELLANEOUS) ×4 IMPLANT
COVER BACK TABLE 24X17X13 BIG (DRAPES) IMPLANT
COVER TABLE BACK 60X90 (DRAPES) ×2 IMPLANT
DECANTER SPIKE VIAL GLASS SM (MISCELLANEOUS) ×2 IMPLANT
DERMABOND ADVANCED (GAUZE/BANDAGES/DRESSINGS) ×3
DERMABOND ADVANCED .7 DNX12 (GAUZE/BANDAGES/DRESSINGS) ×1 IMPLANT
DRAPE C-ARM 42X72 X-RAY (DRAPES) ×4 IMPLANT
DRAPE LAPAROTOMY 100X72X124 (DRAPES) ×2 IMPLANT
DRAPE POUCH INSTRU U-SHP 10X18 (DRAPES) ×2 IMPLANT
DRAPE PROXIMA HALF (DRAPES) IMPLANT
DRESSING TELFA 8X3 (GAUZE/BANDAGES/DRESSINGS) ×2 IMPLANT
DURAPREP 26ML APPLICATOR (WOUND CARE) ×2 IMPLANT
ELECT REM PT RETURN 9FT ADLT (ELECTROSURGICAL) ×2
ELECTRODE REM PT RTRN 9FT ADLT (ELECTROSURGICAL) ×1 IMPLANT
GAUZE SPONGE 4X4 16PLY XRAY LF (GAUZE/BANDAGES/DRESSINGS) ×1 IMPLANT
GLOVE BIOGEL PI IND STRL 8.5 (GLOVE) ×2 IMPLANT
GLOVE BIOGEL PI INDICATOR 8.5 (GLOVE) ×3
GLOVE ECLIPSE 7.5 STRL STRAW (GLOVE) ×4 IMPLANT
GLOVE ECLIPSE 8.5 STRL (GLOVE) ×4 IMPLANT
GLOVE EXAM NITRILE LRG STRL (GLOVE) IMPLANT
GLOVE EXAM NITRILE MD LF STRL (GLOVE) ×1 IMPLANT
GLOVE EXAM NITRILE XL STR (GLOVE) IMPLANT
GLOVE EXAM NITRILE XS STR PU (GLOVE) IMPLANT
GLOVE INDICATOR 8.0 STRL GRN (GLOVE) ×1 IMPLANT
GOWN BRE IMP SLV AUR LG STRL (GOWN DISPOSABLE) IMPLANT
GOWN BRE IMP SLV AUR XL STRL (GOWN DISPOSABLE) ×1 IMPLANT
GOWN STRL REIN 2XL LVL4 (GOWN DISPOSABLE) ×4 IMPLANT
KIT BASIN OR (CUSTOM PROCEDURE TRAY) ×2 IMPLANT
KIT ROOM TURNOVER OR (KITS) ×2 IMPLANT
NEEDLE HYPO 22GX1.5 SAFETY (NEEDLE) ×2 IMPLANT
NS IRRIG 1000ML POUR BTL (IV SOLUTION) ×2 IMPLANT
PACK LAMINECTOMY NEURO (CUSTOM PROCEDURE TRAY) ×2 IMPLANT
PAD ARMBOARD 7.5X6 YLW CONV (MISCELLANEOUS) ×6 IMPLANT
PATTIES SURGICAL .5 X1 (DISPOSABLE) ×2 IMPLANT
SCREW 50MM (Screw) ×2 IMPLANT
SCREW SET (Screw) ×4 IMPLANT
SCREW SET 8.5 (Screw) ×2 IMPLANT
SPONGE GAUZE 4X4 12PLY (GAUZE/BANDAGES/DRESSINGS) ×2 IMPLANT
SPONGE LAP 4X18 X RAY DECT (DISPOSABLE) IMPLANT
SPONGE SURGIFOAM ABS GEL 100 (HEMOSTASIS) ×2 IMPLANT
SUT VIC AB 1 CT1 18XBRD ANBCTR (SUTURE) ×3 IMPLANT
SUT VIC AB 1 CT1 8-18 (SUTURE) ×6
SUT VIC AB 2-0 CP2 18 (SUTURE) ×3 IMPLANT
SUT VIC AB 3-0 SH 8-18 (SUTURE) ×4 IMPLANT
SYR 20ML ECCENTRIC (SYRINGE) ×2 IMPLANT
TOWEL OR 17X24 6PK STRL BLUE (TOWEL DISPOSABLE) ×2 IMPLANT
TOWEL OR 17X26 10 PK STRL BLUE (TOWEL DISPOSABLE) ×3 IMPLANT
TRAP SPECIMEN MUCOUS 40CC (MISCELLANEOUS) ×2 IMPLANT
TRAY FOLEY CATH 14FRSI W/METER (CATHETERS) ×2 IMPLANT
WATER STERILE IRR 1000ML POUR (IV SOLUTION) ×2 IMPLANT

## 2012-02-14 NOTE — Progress Notes (Signed)
UR COMPLETED  

## 2012-02-14 NOTE — Preoperative (Signed)
Beta Blockers   Reason not to administer Beta Blockers:Not Applicable 

## 2012-02-14 NOTE — Op Note (Signed)
Preoperative diagnosis: Pseudoarthrosis L1-L2, back pain, radiculopathy Postoperative diagnosis: Pseudoarthrosis L1-L2, back pain, radiculopathy Procedure: Revision posterior stabilization L1-L2 with posterior lateral arthrodesis using iliac crest bone autograft and from left iliac crest obtained through separate fascial incision. Surgeon: Barnett Abu M.D. Assistant: Wyn Quaker M.D. Indications: [name] 66 year old individual had significant back and bilateral lower extremity pain more on the left side with left flank pain. He had decompression and arthrodesis from L1-L3 a year ago and now has recurrent pain that has gotten progressively worse. He has evidence of screw loosening at the L1 levels. This is confirmed by myelography and CT scanning. Pseudoarthrosis is noted at the L1-L2 level. He's been advised regarding revision surgery for this process.  Procedure: The patient is brought to the operating room supine on a stretcher after the smooth induction of general endotracheal anesthesia he was turned prone and the back was prepped with alcohol and DuraPrep. The area of the left posterior superior iliac crest was marked and prepped also. An elliptical incision was created around the previous scar in the lumbar spine and this was excised. The dissection was carried down to the lumbar dorsal fascia was opened on either side of the midline. The old hardware was exposed from L1-L3 and the soft tissues were dissected clean from it. And then sequentially the pedicle screws were loosened. In the connecting rods and transverse connector was removed. At L2-L3 there was noted to be substantial bone grown up around the screws in the hardware. This was removed with the rods being removed lotion was applied across pedicle screws and was noted to be a slight amount of motion only across L1-L2. Further dissection yielded removal of the screws in L1 and the screws were indeed noted to be loose within the pedicle  holes of L1. He at this point it was decided to size a bigger screw namely a 7.5 x 55 mm screw was placed this was still noted to be loose within the cavity of the pedicle hole. An 8.5 x 50 mm screw was then placed into the pedicle first on the right side and was noted to hold the bone firmly. Attention was then turned to the left side where a similar screw was placed a 8.5 x 50 mm. THe screws were then all removed. The intertransverse place was then cleared and on the left side laminotomies and foraminotomies were created along the L. I nerve root superiorly and the L2 nerve root inferiorly. These were freed and cleared of some fascial tissue. As also noted to be a cystic structure in the region of the cross connector and this was removed and dissected free. Once these areas were cleared the lateral gutters were then decorticated and the intertransverse space where there was noted to be break in the intertransverse space on either side. A narrow crevice was noted in the superior portion of the intertransverse space on the right side. These were cleaned and decorticated. This area was then packed off.  A separate incision was created then over the left posterior superior iliac crest this was taken down through the superficial fascia and fat were. The outer table of the posterior superior iliac crest was then opened with a monopolar cautery. The dissection was taken subperiosteally with Cobb elevator and a Taylor retractor was inserted into the wound. A combination of cortical cancellus strips was obtained along with underlying cancellus bone from the left outer table of the ilium. The ileum was harvested as much of the bone that was  allowed through this aperture. Hemostasis was then checked and the soft tissues in the fascia was then closed in layers with #1-0 Vicryl in the deep fascia 2-0 Vicryl the subcutaneous tissues and 3-0 Vicryl subcuticularly.  The bone that was harvested was then packed into the lateral  gutters using the cancellus bone first and then placing the cortical cancellus strips over it. Hemostasis was obtained in the soft tissues. The wound was inspected carefully. The wound was then closed with #1 Vicryl in an anteverted interrupted fashion and the fascia 2-0 Vicryl was used in the subcutaneous tissues and 3-0 Vicryl subcuticularly. Dermabond was placed on the skin. Blood loss was estimated at 500 cc and 125 cc of blood was returned from the [facility].

## 2012-02-14 NOTE — H&P (Signed)
02/14/12 Mr. Kazmierczak presents for surgical revision of pseudoarthrosis at L1-2. Iliac crest bone graft is to be used.His neurologic function as regards his lower extremities is intact. Left radicular pain is his primary complaint in addition to back and flank pain. Haze Justin. Mohrmann  #[id number] DOB:  25-Apr-1946 01/21/2012:  Michele Mcalpine returns to the office today to discuss ongoing difficulties with his back pain.  We have been treating him conservatively with an external fusion stimulator and because of recent episodes of worsening of the back pain, he requested this visit to discuss some other options.   I reviewed the myelogram and post-myelogram CT scan and the MRI that we did. The concerns regarding some dilated vasculature that we saw in the superior portion of the CT scans were answered by the MRI, what showed that his spinal cord has normal contour and normal anatomy. There is no evidence of any vascular malformation or AV dural fistula.    His lumbar spine demonstrates that there is a pseudoarthrosis at the L1-2 level.  This is both in the interbody portion and also on the posterolateral portion of his arthrodesis. At L2-3 it does appear that he has formed solid bone particularly posterior, but also within the disc space one can see areas of bridging bone.  I noted that there are halos around the superior most screws in L1. There is a small halo around the right-sided screw at L2. The left-sided screw appears to be intact with solid bony continuity.    In an effort to try to control this process, I suggested to Michele Mcalpine that we could revise the posterior hardware with larger hardware and revise the posterolateral arthrodesis in addition to looking at the interbody arthrodesis and supplanting this likely with some own bone from his posterior suprailiac crest.  We would likely harvest the bone from the left side as this is his symptomatic side and hopefully have a solid arthrodesis that he would have to heal after the  surgery is done.  The other alternative would be to continue to treat him conservatively; however, Michele Mcalpine does not feel he is gaining any ground in terms of how he is feeling. His level of function is quite poor. He notes that he lives continuously about the degree and severity of the back pain that he is experiencing.  We will plan on scheduling the revision at the earliest convenience.          Stefani Dama, M.D./[facility] Haze Justin. Okazaki #[id number] April 14, 1998 HISTORY: The patient is a 66 -year-old ambidextrous male seen in neurosurgical consultation because of low back and left hip pain associated with tingling dysesthesias extending into the left lateral thigh and calf, and intermittently involving the second, third and fourth toes. The patient has a prior history of having undergone lumbar spine surgery; in April of 1986 he was having some right leg pain and had operative intervention by Dr. Myrtis Hopping at the [facility] here in [place]. In August of that year he had a secondary procedure. Since that time and especially over the last three to four years, he has had some intermittent low back pain and some occasional right leg symptoms to the level of the knee. In October without any specific precipitating circumstances he started having some pain into the left hip and extending down the leg as noted above. He is more comfortable sitting and is better with lying down. Walking or standing is particularly uncomfortable and he has had to limit  his public speaking because of this. He has had some problems with right neck pain in the past (1998); he had plain cervical spine x-rays made which showed spondylosis at C6-7 and some osteophytes at multiple levels anteriorly. There was some foraminal narrowing bilaterally at C3-4 and on the left at C6-7, and on the right at C4-5. An MIRI scan of his cervical spine showed evidence of multilevel spondylosis with some spurring at  the C6-7 level posteriorly and some foraminal narrowing bilaterally at C3-4 and also at C5-6. No focal disc protrusion was apparently seen on these films. The patient has also had a series of two epidural steroid injections without any relief He is not having significant cervical symptoms at this time. There is no history of Lhermitte's phenomenon or radicular pain into his upper extremities. He does ot give a history of bowel or bladder dysfhnction. PAST MEDICAL HISTORY: See above; the patient had right knee surgery by Dr. Arletha Grippe. He also has had left wrist surgery by Dr. Arletha Grippe. The patient has no known drug allergies. He does not smoke. He drinks alcohol only socially. He weighs 285 lbs. and is 6'4" tall. He takes one Aspirin a day and one multivitamin. He has also been on Prevacid for hiatal hernia and reflux and he also takes a dietary supplement. The patient's personal physician is Dr. Ouida Sills. PERSONAL HISTORY: He is married and has three children. 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 He was quite active in sports when he was youngcr. He served in the [employer] for about ten years. FAMILY HISTORY: His mother is age 12 years and is in good health. One brother who weighs more than 400 lbs. had a myocardial infarction in his 30s. One sister is in good health. His father died of congestive heart failure in his 60s; this was felt to be related to a lung injury from World War II. REVIEW OF SYSTEMS: Negative except as noted above. PHYSICAL EXAMINATION: General: The patient is a tall, moderately obese, very muscular appearing male. HEENT negative. Neck -- mild limitation of extension. No Lhermitte's phenomenon or radicular pain with extremes of neck motion. No increase in back discomfort with extremes of fiexion or extension. Lungs clear. Heart distant sounds, no munnurs. Abdomen protuberant secondary to exogenous obesity. Extremities see below; there are no acute ischemic changes  and no evidence of thrombophlebitis. He has no tone abnormalities in his extremities. Hip rotation is negative bilaterally. NEUROLOGICAL EXAMINATION: The patient is alert,generally oriented and cooperative. He is quite intelligent and articulate. His PERRLA. Extraocular movements are full. The discs are flat. There is no ptosis and no evidence of a Homer's syndrome. He has no facial paresis. Speech, hearing and facial sensation are normal. The patient ambulates without difficulty. He can walk on his heel and toes and go up a single step with either leg first. He can stand on the toes of either foot with the other leg off the ground. Back extension is limited and causes some pain into the left hip and proximal thigh. Forward bending at about 70 degrees causes some left lateral calf discomfort. To direct testing, his strength is normal. He has a well healed lumbar incision. He has decreased lumbar lordosis. I could not be sure of any paravertebral spasm. Joint position is intact. He has some subjective numbness of his lateral left calf, but actually feels the pin satisfactorily. Deep tendon reflexes: On the right, brachioradialis 1-2+, biceps 1-2+, triceps 2+, knee jerk 1-2+,  ankle jerk 2+. On the left, brachioradialis l-2+ biceps 1-2+, triceps 1-2+, knee jerk 1-2+, ankle jerk 1-2+. Babinski responses are plantar bilaterally. There is no ankle clonus. Straight leg raising is done to about 80 degrees bilaterally. Hip rotation as noted above is negative bilaterally. Femoral stretch maneuver is negative. When the patient lies on his stomach he gets the tingling dysesthesias down his left leg. DIAGNOSTIC STUDIES: The patient's MRI scan shows multilevel degenerative disease. This is worse at L3-4, L4-5 and L5-Sl. There appears to be marked interspace narrowing at L4-5. The patient has some disc bulges at [facility] -2 and L2-3 eccentric to the right. There appear to be post-operative changes on the right at L4-5. There is  foraminal narrowing at L5-S I bilateraLly, more so on the left than the right. I do not see a definite herniated nucleus pulposus on the right at either L4-5 or L5-S 1. There also is some foraminal nanowing at the L4-5 level. CLINICAL IMPRESSION: Low back, left hip and leg pain, probably secondary to lumbar disc disease; advanced spondylosis at L4-5 and L5-Sl; present to a lesser extent at L3- 4; history of laminectomies x 2, Dr. Myrtis Hopping, 1986 RECOMMENDATIONS: The clinical situation was revIewed in detail with the patient. He has some loss of resolution on these films that I feel is related to his size and he may well require outpatient Omnipaque myelography and post-myelogram CT scanning. For now we are going to obtain his records from the [facility] and [facility] so that I can understand exactly what Dr. Lynnette Caffey did and he is to have a complete set of spine x-rays with flexion and extension views to see if he has any instability. He is going to call me on Friday of this week for fhrther recommendations. [facility] Alanson Aly. Roxan Hockey, M.D. SCR:aft

## 2012-02-14 NOTE — Anesthesia Procedure Notes (Signed)
Procedure Name: Intubation Date/Time: 02/14/2012 8:01 AM Performed by: Tyrone Nine Pre-anesthesia Checklist: Emergency Drugs available, Suction available, Patient identified and Patient being monitored Patient Re-evaluated:Patient Re-evaluated prior to inductionOxygen Delivery Method: Circle system utilized Preoxygenation: Pre-oxygenation with 100% oxygen Intubation Type: IV induction Ventilation: Mask ventilation without difficulty and Oral airway inserted - appropriate to patient size Laryngoscope Size: Mac and 3 Grade View: Grade I Tube type: Oral Tube size: 8.0 mm Number of attempts: 1 Airway Equipment and Method: Stylet Placement Confirmation: ETT inserted through vocal cords under direct vision and breath sounds checked- equal and bilateral Secured at: 23 cm Tube secured with: Tape Dental Injury: Teeth and Oropharynx as per pre-operative assessment

## 2012-02-14 NOTE — Anesthesia Preprocedure Evaluation (Addendum)
Anesthesia Evaluation  Patient identified by MRN, date of birth, ID band Patient awake    Reviewed: Allergy & Precautions, H&P , NPO status , Patient's Chart, lab work & pertinent test results  Airway Mallampati: III TM Distance: >3 FB Neck ROM: Full    Dental No notable dental hx. (+) Teeth Intact and Dental Advisory Given   Pulmonary sleep apnea and Continuous Positive Airway Pressure Ventilation , COPD Former smokeless tobacco user clear to auscultation  Pulmonary exam normal       Cardiovascular hypertension, Pt. on medications + CAD (cath '08 mod ASCADz, normal LVF, EF 55-60%) Regular Normal    Neuro/Psych Depression  Neuromuscular disease (chronic back pain- narcotics daily)    GI/Hepatic GERD-  Medicated and Controlled,  Endo/Other  Morbid obesity  Renal/GU      Musculoskeletal  (+) Arthritis -,   Abdominal (+) obese,   Peds  Hematology   Anesthesia Other Findings   Reproductive/Obstetrics                         Anesthesia Physical Anesthesia Plan  ASA: III  Anesthesia Plan: General   Post-op Pain Management:    Induction: Intravenous  Airway Management Planned: Oral ETT  Additional Equipment:   Intra-op Plan:   Post-operative Plan: Extubation in OR  Informed Consent: I have reviewed the patients History and Physical, chart, labs and discussed the procedure including the risks, benefits and alternatives for the proposed anesthesia with the patient or authorized representative who has indicated his/her understanding and acceptance.   Dental advisory given  Plan Discussed with: CRNA and Surgeon  Anesthesia Plan Comments: (Plan routine monitors, GETA)        Anesthesia Quick Evaluation

## 2012-02-14 NOTE — Progress Notes (Signed)
Patient ID: Cody Velez, male   DOB: 05/09/1946, 66 y.o.   MRN: [id number] Alert and oriented. Back with modest pain. Lower extremity function intact. Feels fair, not using much PCA. Would like to mobilize.  We'll DC PCA start oral pain meds. If mobilizing well may consider DC tomorrow.

## 2012-02-14 NOTE — Progress Notes (Signed)
   CARE MANAGEMENT NOTE 02/14/2012  Patient:  KAYDAN, WONG   Account Number:  [id number]  Date Initiated:  02/14/2012  Documentation initiated by:  [facility]  Subjective/Objective Assessment:   surgical revision of pseudoarthrosis at L1-2     Action/Plan:   Anticipated DC Date:     Anticipated DC Plan:  HOME W HOME HEALTH SERVICES      DC Planning Services  CM consult      [facility] Choice  HOME HEALTH   Choice offered to / List presented to:  C-1 Patient           Status of service:  In process, will continue to follow Medicare Important Message given?   (If response is "NO", the following Medicare IM given date fields will be blank) Date Medicare IM given:   Date Additional Medicare IM given:    Discharge Disposition:    Per UR Regulation:    Comments:  02/14/12 1600 Spoke to pt and states he had [facility] in the past. Provided pt with [facility] agency list. Contacted [facility] to make them aware. Will await PT/OT eval for [facility] services. Pt states he has all his DME at home, such as RW, bedside commode and tub bench. Isidoro Donning RN CCM Case Mgmt phone [phone]

## 2012-02-14 NOTE — Anesthesia Postprocedure Evaluation (Signed)
  Anesthesia Post-op Note  Patient: Cody Velez  Procedure(s) Performed: Procedure(s) (LRB): POSTERIOR LUMBAR FUSION 1 LEVEL (N/A)  Patient Location: PACU  Anesthesia Type: General  Level of Consciousness: awake, alert  and oriented  Airway and Oxygen Therapy: Patient Spontanous Breathing and Patient connected to nasal cannula oxygen  Post-op Pain: mild  Post-op Assessment: Post-op Vital signs reviewed, Patient's Cardiovascular Status Stable, Respiratory Function Stable, Patent Airway and No signs of Nausea or vomiting  Post-op Vital Signs: Reviewed and stable  Complications: No apparent anesthesia complications

## 2012-02-14 NOTE — Transfer of Care (Signed)
Immediate Anesthesia Transfer of Care Note  Patient: Cody Velez  Procedure(s) Performed: Procedure(s) (LRB): POSTERIOR LUMBAR FUSION 1 LEVEL (N/A)  Patient Location: PACU  Anesthesia Type: General  Level of Consciousness: awake, alert , oriented and patient cooperative  Airway & Oxygen Therapy: Patient Spontanous Breathing and Patient connected to face mask oxygen  Post-op Assessment: Report given to PACU RN and Post -op Vital signs reviewed and stable  Post vital signs: Reviewed and stable  Complications: No apparent anesthesia complications

## 2012-02-15 NOTE — Progress Notes (Signed)
OT NOTE: OT consult received and appreciated. Pt. Reports no OT needs due to this being pt's 7th back surgery. Pt. Able to state back precautions and correct techniques for completing ADLs. Will sign off and recommend D/C home with wife.  Thanks!  Cassandria Anger, OTR/L Pager: [phone] 02/15/2012 .

## 2012-02-15 NOTE — Progress Notes (Signed)
Physical Therapy Evaluation Patient Details Name: Cody Velez MRN: [id number] DOB: September 13, 1946 Today's Date: 02/15/2012  Problem List:  Patient Active Problem List  Diagnoses  . HYPERCHOLESTEROLEMIA  . HYPOKALEMIA  . OVERWEIGHT/OBESITY  . ALCOHOL USE  . SMOKELESS TOBACCO ABUSE  . OBSTRUCTIVE SLEEP APNEA  . HYPERTENSION  . CAD, NATIVE VESSEL  . GERD  . RECTAL BLEEDING  . KNEE, ARTHRITIS, DEGEN./OSTEO  . ANSERINE BURSITIS, LEFT  . STRESS FRACTURE, TIBIA  . SYNCOPE AND COLLAPSE  . DYSPNEA ON EXERTION    Past Medical History:  Past Medical History  Diagnosis Date  . CAD in native artery   . Dyspnea on exertion   . Hypokalemia   . Rectal bleeding   . Hyperlipidemia   . Allergic rhinitis   . Hypertension     FOLLOWED BY DR TOM WALL.NL CATH 6 YRS AGO  . Depression   . Coronary artery disease   . OSA (obstructive sleep apnea)     followed by dr Shelle Iron.see note 08/2011  . Asthma     "when I was a baby"  . Pneumonia 02/2011    "first time ever"  . GERD (gastroesophageal reflux disease)   . Arthritis     "all over"   Past Surgical History:  Past Surgical History  Procedure Date  . Bilateral knee arthroscopy   . Facial cosmetic surgery ` 1985    "broke face playing softball"  . Back surgery 02/14/12    lumbar OR #7; "today redid L1L2; replaced screws; added bone from hip"  . Fracture surgery     "left wrist; broke it; took spur off"    PT Assessment/Plan/Recommendation PT Assessment Clinical Impression Statement: Pt presents with a medical diagnosis of revusion of L1-2 fusion. Pt educated on back precautions, although recalls from previous back surgeries. Pt is near baseline level. Pt will benefit from skilled PT in the acute care setting in order to reutrn home safely at a modified independence level with safe technique. PT Recommendation/Assessment: Patient will need skilled PT in the acute care venue PT Problem List: Decreased activity tolerance;Decreased  mobility;Pain;Decreased knowledge of precautions;Decreased knowledge of use of DME PT Therapy Diagnosis : Abnormality of gait;Acute pain PT Plan PT Frequency: Min 5X/week PT Treatment/Interventions: DME instruction;Gait training;Stair training;Functional mobility training;Patient/family education PT Recommendation Follow Up Recommendations: No PT follow up Equipment Recommended: None recommended by PT PT Goals  Acute Rehab PT Goals PT Goal Formulation: With patient Time For Goal Achievement: 7 days Pt will go Supine/Side to Sit: with modified independence PT Goal: Supine/Side to Sit - Progress: Goal set today Pt will go Sit to Supine/Side: with modified independence PT Goal: Sit to Supine/Side - Progress: Goal set today Pt will go Sit to Stand: with modified independence PT Goal: Sit to Stand - Progress: Goal set today Pt will go Stand to Sit: with modified independence PT Goal: Stand to Sit - Progress: Goal set today Pt will Transfer Bed to Chair/Chair to Bed: with modified independence PT Transfer Goal: Bed to Chair/Chair to Bed - Progress: Goal set today Pt will Ambulate: >150 feet;with modified independence;with least restrictive assistive device PT Goal: Ambulate - Progress: Goal set today Pt will Go Up / Down Stairs: Flight;with supervision;with rail(s) PT Goal: Up/Down Stairs - Progress: Goal set today  PT Evaluation Precautions/Restrictions  Precautions Precautions: Back Precaution Comments: pt educated on 3/3 back precautions Required Braces or Orthoses: Yes Spinal Brace: Lumbar corset Restrictions Weight Bearing Restrictions: No Prior Functioning  Home Living Lives  With: Spouse Receives Help From: Family Type of Home: House Home Layout: Two level;Able to live on main level with bedroom/bathroom Alternate Level Stairs-Rails: Right Alternate Level Stairs-Number of Steps: 13 Home Access: Stairs to enter Entrance Stairs-Rails: Can reach both Entrance Stairs-Number  of Steps: 7 Bathroom Shower/Tub: Tub/shower unit;Curtain Firefighter: Standard Bathroom Accessibility: Yes How Accessible: Accessible via walker Home Adaptive Equipment: Walker - rolling;Shower chair with back;Bedside commode/3-in-1;Straight cane;Crutches Prior Function Level of Independence: Independent with basic ADLs;Independent with gait;Independent with transfers (ambulated with cane) Able to Take Stairs?: Yes Driving: Yes Vocation: Full time employment Cognition Cognition Arousal/Alertness: Awake/alert Overall Cognitive Status: Appears within functional limits for tasks assessed Orientation Level: Oriented X4 Sensation/Coordination Sensation Light Touch: Appears Intact Extremity Assessment RLE Assessment RLE Assessment: Within Functional Limits LLE Assessment LLE Assessment: Within Functional Limits Mobility (including Balance) Bed Mobility Bed Mobility: Yes Rolling Left: 5: Supervision Rolling Left Details (indicate cue type and reason): VC to maintain back precautuions and prevent twisting Left Sidelying to Sit: 5: Supervision Left Sidelying to Sit Details (indicate cue type and reason): VC to maintain back precautions with proper technique Sitting - Scoot to Edge of Bed: 6: Modified independent (Device/Increase time) Transfers Transfers: Yes Sit to Stand: 5: Supervision;With upper extremity assist;From bed Sit to Stand Details (indicate cue type and reason): VC for hand placement for safety to RW Stand to Sit: With upper extremity assist;To chair/3-in-1;5: Supervision Stand to Sit Details: VC for hand placement as well as controlled descent Ambulation/Gait Ambulation/Gait: Yes Ambulation/Gait Assistance: 5: Supervision Ambulation/Gait Assistance Details (indicate cue type and reason): Supervision for safety during ambulation with RW. Cues throughout for safety with distance to RW and proper gait technique Ambulation Distance (Feet): 200 Feet Assistive device:  Rolling walker Gait Pattern: Step-to pattern;Decreased hip/knee flexion - right;Decreased hip/knee flexion - left;Decreased trunk rotation Gait velocity: Decreased gait speed Stairs: Yes Stairs Assistance: 5: Supervision Stairs Assistance Details (indicate cue type and reason): VC for proper sequencing and safety  Stair Management Technique: Two rails;Step to pattern;Forwards Number of Stairs: 5     Exercise    End of Session PT - End of Session Equipment Utilized During Treatment: Gait belt;Back brace Activity Tolerance: Patient tolerated treatment well Patient left: in chair;with call bell in reach;with family/visitor present Nurse Communication: Mobility status for transfers;Mobility status for ambulation General Behavior During Session: [facility] for tasks performed Cognition: [facility] for tasks performed  Milana Kidney 02/15/2012, 10:55 AM  02/15/2012 Milana Kidney DPT PAGER: [phone] OFFICE: [phone]

## 2012-02-15 NOTE — Progress Notes (Signed)
   CARE MANAGEMENT NOTE 02/15/2012  Patient:  Cody Velez, Cody Velez   Account Number:  [id number]  Date Initiated:  02/14/2012  Documentation initiated by:  [facility]  Subjective/Objective Assessment:   surgical revision of pseudoarthrosis at L1-2     Action/Plan:   lives at home with wife   Anticipated DC Date:  02/15/2012   Anticipated DC Plan:  HOME W HOME HEALTH SERVICES      DC Planning Services  CM consult      [facility] Choice  HOME HEALTH   Choice offered to / List presented to:  C-1 Patient        HH arranged  HH-2 PT      [facility] agency  [facility]   Status of service:  Completed, signed off Medicare Important Message given?   (If response is "NO", the following Medicare IM given date fields will be blank) Date Medicare IM given:   Date Additional Medicare IM given:    Discharge Disposition:  HOME/SELF CARE  Per UR Regulation:    Comments:  02/15/2012 1500 MD consult for CM to arrange [facility] PT. Pt is agreeable to [facility] PT per [facility] rep. [facility] will follow up with pt for [facility] PT. Isidoro Donning RN CCM Case Mgmt phone [phone]  02/15/2012 1240 No PT/OT recommended for home. Isidoro Donning RN CCM Case Mgmt phone [phone]  02/14/12 1600 Spoke to pt and states he had [facility] in the past. Provided pt with [facility] agency list. Contacted [facility] to make them aware. Will await PT/OT eval for [facility] services. Pt states he has all his DME at home, such as RW, bedside commode and tub bench. Isidoro Donning RN CCM Case Mgmt phone [phone]

## 2012-02-15 NOTE — Progress Notes (Signed)
   CARE MANAGEMENT NOTE 02/15/2012  Patient:  Cody Velez, Cody Velez   Account Number:  [id number]  Date Initiated:  02/14/2012  Documentation initiated by:  [facility]  Subjective/Objective Assessment:   surgical revision of pseudoarthrosis at L1-2     Action/Plan:   Anticipated DC Date:  02/15/2012   Anticipated DC Plan:  HOME W HOME HEALTH SERVICES      DC Planning Services  CM consult      [facility] Choice  HOME HEALTH   Choice offered to / List presented to:  C-1 Patient           Status of service:  Completed, signed off Medicare Important Message given?   (If response is "NO", the following Medicare IM given date fields will be blank) Date Medicare IM given:   Date Additional Medicare IM given:    Discharge Disposition:  HOME/SELF CARE  Per UR Regulation:    Comments:  02/15/2012 1240 No PT/OT recommended for home. Isidoro Donning RN CCM Case Mgmt phone [phone]  02/14/12 1600 Spoke to pt and states he had [facility] in the past. Provided pt with [facility] agency list. Contacted [facility] to make them aware. Will await PT/OT eval for [facility] services. Pt states he has all his DME at home, such as RW, bedside commode and tub bench. Isidoro Donning RN CCM Case Mgmt phone [phone]

## 2012-02-15 NOTE — Progress Notes (Signed)
No brace available at bed side.Order doen't specify to use a brace when get up, but pt don't want to get up without brace. Thanks

## 2012-02-15 NOTE — Discharge Summary (Signed)
Physician Discharge Summary  Patient ID: Cody Velez MRN: [id number] DOB/AGE: 27-Dec-1945 66 y.o.  Admit date: 02/14/2012 Discharge date: 02/15/2012  Admission Diagnoses: Lumbar pseudoarthrosis L1-L2  Discharge Diagnoses: MR pseudoarthrosis L1-L2 Active Problems:  * No active hospital problems. *    Discharged Condition: good  Hospital Course: Patient was admitted to undergo surgical decompression at L1-L2 followed by Oser lateral arthrodesis using iliac crest bone graft revision of the hardware was also performed. The patient tolerated surgery well he has been ambulating with a significant decrease in his preoperative pain.  Consults: None  Significant Diagnostic Studies: Preoperative myelogram demonstrating pseudarthrosis at L1-L2 with mild stenosis for the L1 and L2 nerve roots.  Treatments: surgery: Revision of fusion L1-L2 with iliac crest bone graft, revision of pedicle screw fixation.  Discharge Exam: Blood pressure 131/79, pulse 81, temperature 98.2 F (36.8 C), temperature source Oral, resp. rate 20, height 6\' 3"  (1.905 m), weight 139.708 kg (308 lb), SpO2 94.00%. Motor function is intact in lower extremities in the major groups including iliopsoas quadriceps tibialis anterior and gastrocs. Incision over the left posterior suprailiac crest is clean and dry the midline incision has had some bleedthrough with a ssmall suubccutaneoous hematommaa. Dressing change has been performed.  Disposition: 01-Home or Self Care  Discharge Orders    Future Appointments: Provider: Department: Dept Phone: Center:   09/07/2012 9:00 AM Barbaraann Share, MD [facility] [phone] None     Future Orders Please Complete By Expires   Diet - low sodium heart healthy      Increase activity slowly      Discharge instructions      Comments:   Sit straight walk straight stand straight. Patient may shower. Placed dressing on incision if bleedthrough continues to occur. Mind your posture. May  resume driving when not requiring pain medication.   Call MD for:  temperature >100.4      Call MD for:  severe uncontrolled pain      Call MD for:  redness, tenderness, or signs of infection (pain, swelling, redness, odor or green/yellow discharge around incision site)        Medication List  As of 02/15/2012 12:31 PM   TAKE these medications         aspirin 81 MG EC tablet   Take 81 mg by mouth at bedtime.      chlorthalidone 25 MG tablet   Commonly known as: HYGROTON   Take 25 mg by mouth at bedtime.      co-enzyme Q-10 50 MG capsule   Take 50 mg by mouth daily.      docusate sodium 100 MG capsule   Commonly known as: COLACE   Take 100 mg by mouth daily.      esomeprazole 40 MG capsule   Commonly known as: NEXIUM   Take 40 mg by mouth daily before breakfast.      Fish Oil 1200 MG Caps   Take 1 capsule by mouth daily.      FLUoxetine 40 MG capsule   Commonly known as: PROZAC   Take 40 mg by mouth daily.      fluticasone 50 MCG/ACT nasal spray   Commonly known as: FLONASE   Place 2 sprays into the nose daily.      loratadine 10 MG tablet   Commonly known as: CLARITIN   Take 10 mg by mouth daily.      losartan 50 MG tablet   Commonly known as: COZAAR   Take  50 mg by mouth daily.      methocarbamol 750 MG tablet   Commonly known as: ROBAXIN   Take 750 mg by mouth 3 (three) times daily as needed. For spasms      mulitivitamin with minerals Tabs   Take 1 tablet by mouth daily.      oxyCODONE-acetaminophen 10-325 MG per tablet   Commonly known as: PERCOCET   Take 1 tablet by mouth 3 (three) times daily as needed. For pain      potassium chloride 20 MEQ packet   Commonly known as: KLOR-CON   Take 20 mEq by mouth 2 (two) times daily.      rosuvastatin 5 MG tablet   Commonly known as: CRESTOR   Take 5 mg by mouth at bedtime.      SINUS CONGESTION/PAIN DAYTIME PO   Take 1 tablet by mouth daily.           Follow-up Information    Follow up with  Stefani Dama, MD. Schedule an appointment as soon as possible for a visit in 3 weeks. (Call Aram Beecham for appointment)    Contact information:   [address] [place] [address] [phone]          Signed: Stefani Dama 02/15/2012, 12:31 PM

## 2012-02-16 MED FILL — Sodium Chloride Irrigation Soln 0.9%: Qty: 3000 | Status: AC

## 2012-02-16 MED FILL — Sodium Chloride IV Soln 0.9%: INTRAVENOUS | Qty: 1000 | Status: AC

## 2012-02-16 MED FILL — Heparin Sodium (Porcine) Inj 1000 Unit/ML: INTRAMUSCULAR | Qty: 30 | Status: AC

## 2012-05-30 ENCOUNTER — Observation Stay (HOSPITAL_COMMUNITY)
Admission: EM | Admit: 2012-05-30 | Discharge: 2012-05-31 | Disposition: A | Discharge status: Home / Self Care | Payer: Federal, State, Local not specified - PPO | Source: Ambulatory Visit | Attending: Cardiovascular Disease | Admitting: Cardiovascular Disease

## 2012-05-30 ENCOUNTER — Emergency Department (HOSPITAL_COMMUNITY): Payer: Federal, State, Local not specified - PPO

## 2012-05-30 ENCOUNTER — Encounter (HOSPITAL_COMMUNITY): Payer: Self-pay | Admitting: Emergency Medicine

## 2012-05-30 DIAGNOSIS — F17200 Nicotine dependence, unspecified, uncomplicated: Secondary | ICD-10-CM | POA: Insufficient documentation

## 2012-05-30 DIAGNOSIS — I2510 Atherosclerotic heart disease of native coronary artery without angina pectoris: Secondary | ICD-10-CM | POA: Insufficient documentation

## 2012-05-30 DIAGNOSIS — E7800 Pure hypercholesterolemia, unspecified: Secondary | ICD-10-CM | POA: Diagnosis present

## 2012-05-30 DIAGNOSIS — R0789 Other chest pain: Principal | ICD-10-CM | POA: Insufficient documentation

## 2012-05-30 DIAGNOSIS — I10 Essential (primary) hypertension: Secondary | ICD-10-CM | POA: Insufficient documentation

## 2012-05-30 DIAGNOSIS — M549 Dorsalgia, unspecified: Secondary | ICD-10-CM | POA: Insufficient documentation

## 2012-05-30 DIAGNOSIS — K219 Gastro-esophageal reflux disease without esophagitis: Secondary | ICD-10-CM | POA: Diagnosis present

## 2012-05-30 DIAGNOSIS — E785 Hyperlipidemia, unspecified: Secondary | ICD-10-CM | POA: Insufficient documentation

## 2012-05-30 DIAGNOSIS — R079 Chest pain, unspecified: Secondary | ICD-10-CM

## 2012-05-30 DIAGNOSIS — G4733 Obstructive sleep apnea (adult) (pediatric): Secondary | ICD-10-CM | POA: Diagnosis present

## 2012-05-30 DIAGNOSIS — G8929 Other chronic pain: Secondary | ICD-10-CM

## 2012-05-30 HISTORY — DX: Dorsalgia, unspecified: M54.9

## 2012-05-30 HISTORY — DX: Other chronic pain: G89.29

## 2012-05-30 LAB — DIFFERENTIAL
Basophils Absolute: 0 10*3/uL (ref 0.0–0.1)
Basophils Relative: 1 % (ref 0–1)
Eosinophils Absolute: 0.3 10*3/uL (ref 0.0–0.7)
Eosinophils Relative: 5 % (ref 0–5)
Lymphocytes Relative: 26 % (ref 12–46)
Lymphs Abs: 1.5 10*3/uL (ref 0.7–4.0)
Monocytes Absolute: 0.7 10*3/uL (ref 0.1–1.0)
Monocytes Relative: 13 % — ABNORMAL HIGH (ref 3–12)
Neutro Abs: 3.3 10*3/uL (ref 1.7–7.7)
Neutrophils Relative %: 56 % (ref 43–77)

## 2012-05-30 LAB — CREATININE, SERUM
Creatinine, Ser: 1.02 mg/dL (ref 0.50–1.35)
GFR calc Af Amer: 87 mL/min — ABNORMAL LOW (ref >90)
GFR calc non Af Amer: 75 mL/min — ABNORMAL LOW (ref >90)

## 2012-05-30 LAB — BASIC METABOLIC PANEL
BUN: 8 mg/dL (ref 6–23)
CO2: 28 mEq/L (ref 19–32)
Calcium: 9.3 mg/dL (ref 8.4–10.5)
Chloride: 103 mEq/L (ref 96–112)
Creatinine, Ser: 1.06 mg/dL (ref 0.50–1.35)
GFR calc Af Amer: 83 mL/min — ABNORMAL LOW (ref >90)
GFR calc non Af Amer: 72 mL/min — ABNORMAL LOW (ref >90)
Glucose, Bld: 92 mg/dL (ref 70–99)
Potassium: 4.4 mEq/L (ref 3.5–5.1)
Sodium: 138 mEq/L (ref 135–145)

## 2012-05-30 LAB — CBC
HCT: 39.2 % (ref 39.0–52.0)
HCT: 41.5 % (ref 39.0–52.0)
Hemoglobin: 12.4 g/dL — ABNORMAL LOW (ref 13.0–17.0)
Hemoglobin: 13.1 g/dL (ref 13.0–17.0)
MCH: 26.3 pg (ref 26.0–34.0)
MCH: 26.4 pg (ref 26.0–34.0)
MCHC: 31.6 g/dL (ref 30.0–36.0)
MCHC: 31.6 g/dL (ref 30.0–36.0)
MCV: 83.2 fL (ref 78.0–100.0)
MCV: 83.7 fL (ref 78.0–100.0)
Platelets: 314 10*3/uL (ref 150–400)
Platelets: 327 10*3/uL (ref 150–400)
RBC: 4.71 MIL/uL (ref 4.22–5.81)
RBC: 4.96 MIL/uL (ref 4.22–5.81)
RDW: 14.4 % (ref 11.5–15.5)
RDW: 14.4 % (ref 11.5–15.5)
WBC: 5.8 10*3/uL (ref 4.0–10.5)
WBC: 6.2 10*3/uL (ref 4.0–10.5)

## 2012-05-30 LAB — CARDIAC PANEL(CRET KIN+CKTOT+MB+TROPI)
CK, MB: 2.8 ng/mL (ref 0.3–4.0)
CK, MB: 2.5 ng/mL (ref 0.3–4.0)
Relative Index: 2.4 (ref 0.0–2.5)
Relative Index: 2.4 (ref 0.0–2.5)
Total CK: 115 U/L (ref 7–232)
Total CK: 106 U/L (ref 7–232)
Troponin I: <0.3 ng/mL (ref <0.30)
Troponin I: <0.3 ng/mL (ref <0.30)

## 2012-05-30 LAB — LIPID PANEL
Cholesterol: 175 mg/dL (ref 0–200)
HDL: 28 mg/dL — ABNORMAL LOW (ref >39)
LDL Cholesterol: 108 mg/dL — ABNORMAL HIGH (ref 0–99)
Total CHOL/HDL Ratio: 6.3 RATIO
Triglycerides: 194 mg/dL — ABNORMAL HIGH (ref <150)
VLDL: 39 mg/dL (ref 0–40)

## 2012-05-30 LAB — POCT I-STAT TROPONIN I: Troponin i, poc: 0 ng/mL (ref 0.00–0.08)

## 2012-05-30 MED ORDER — ONDANSETRON HCL 4 MG/2ML IJ SOLN: 4.0000 mg | Freq: Four times a day (QID) | INTRAMUSCULAR | Status: DC | PRN

## 2012-05-30 MED ORDER — POTASSIUM CHLORIDE CRYS ER 20 MEQ PO TBCR
20.0000 meq | EXTENDED_RELEASE_TABLET | Freq: Two times a day (BID) | ORAL | Status: DC
  Administered 2012-05-30 – 2012-05-31 (×2): 20 meq via ORAL
  Filled 2012-05-30 (×3): qty 1

## 2012-05-30 MED ORDER — PANTOPRAZOLE SODIUM 40 MG PO TBEC
80.0000 mg | DELAYED_RELEASE_TABLET | Freq: Every day | ORAL | Status: DC
  Administered 2012-05-31: 80 mg via ORAL
  Filled 2012-05-30: qty 1

## 2012-05-30 MED ORDER — NITROGLYCERIN 0.4 MG SL SUBL
0.4000 mg | SUBLINGUAL_TABLET | Freq: Once | SUBLINGUAL | Status: AC
  Administered 2012-05-30: 0.4 mg via SUBLINGUAL

## 2012-05-30 MED ORDER — ENOXAPARIN SODIUM 40 MG/0.4ML ~~LOC~~ SOLN
40.0000 mg | SUBCUTANEOUS | Status: DC
  Administered 2012-05-30: 40 mg via SUBCUTANEOUS
  Filled 2012-05-30 (×2): qty 0.4

## 2012-05-30 MED ORDER — FLUTICASONE PROPIONATE 50 MCG/ACT NA SUSP
2.0000 | Freq: Every day | NASAL | Status: DC
  Administered 2012-05-31: 2 via NASAL
  Filled 2012-05-30: qty 16

## 2012-05-30 MED ORDER — MORPHINE SULFATE 4 MG/ML IJ SOLN
4.0000 mg | Freq: Once | INTRAMUSCULAR | Status: AC
  Administered 2012-05-30: 4 mg via INTRAVENOUS
  Filled 2012-05-30: qty 1

## 2012-05-30 MED ORDER — LOSARTAN POTASSIUM 50 MG PO TABS
50.0000 mg | ORAL_TABLET | Freq: Every day | ORAL | Status: DC
  Administered 2012-05-31: 50 mg via ORAL
  Filled 2012-05-30: qty 1

## 2012-05-30 MED ORDER — OXYCODONE-ACETAMINOPHEN 5-325 MG PO TABS
1.0000 | ORAL_TABLET | Freq: Three times a day (TID) | ORAL | Status: DC | PRN
  Administered 2012-05-31: 1 via ORAL
  Filled 2012-05-30: qty 1

## 2012-05-30 MED ORDER — METHOCARBAMOL 750 MG PO TABS
750.0000 mg | ORAL_TABLET | Freq: Three times a day (TID) | ORAL | Status: DC | PRN
  Filled 2012-05-30: qty 1

## 2012-05-30 MED ORDER — ZOLPIDEM TARTRATE 5 MG PO TABS: 5.0000 mg | ORAL_TABLET | Freq: Every evening | ORAL | Status: DC | PRN

## 2012-05-30 MED ORDER — POTASSIUM CHLORIDE 20 MEQ PO PACK
20.0000 meq | PACK | Freq: Two times a day (BID) | ORAL | Status: DC
  Filled 2012-05-30: qty 1

## 2012-05-30 MED ORDER — ALPRAZOLAM 0.25 MG PO TABS: 0.2500 mg | ORAL_TABLET | Freq: Two times a day (BID) | ORAL | Status: DC | PRN

## 2012-05-30 MED ORDER — CHLORTHALIDONE 25 MG PO TABS
25.0000 mg | ORAL_TABLET | Freq: Every day | ORAL | Status: DC
  Administered 2012-05-30: 25 mg via ORAL
  Filled 2012-05-30 (×2): qty 1

## 2012-05-30 MED ORDER — OXYCODONE HCL 5 MG PO TABS: 5.0000 mg | ORAL_TABLET | Freq: Three times a day (TID) | ORAL | Status: DC | PRN

## 2012-05-30 MED ORDER — DOCUSATE SODIUM 100 MG PO CAPS
100.0000 mg | ORAL_CAPSULE | Freq: Every day | ORAL | Status: DC
  Administered 2012-05-30 – 2012-05-31 (×2): 100 mg via ORAL
  Filled 2012-05-30 (×2): qty 1

## 2012-05-30 MED ORDER — SODIUM CHLORIDE 0.9 % IJ SOLN: 3.0000 mL | INTRAMUSCULAR | Status: DC | PRN

## 2012-05-30 MED ORDER — SODIUM CHLORIDE 0.9 % IJ SOLN
3.0000 mL | Freq: Two times a day (BID) | INTRAMUSCULAR | Status: DC
  Administered 2012-05-30: 3 mL via INTRAVENOUS

## 2012-05-30 MED ORDER — ASPIRIN EC 81 MG PO TBEC
81.0000 mg | DELAYED_RELEASE_TABLET | Freq: Every day | ORAL | Status: DC
  Administered 2012-05-30: 81 mg via ORAL
  Filled 2012-05-30 (×2): qty 1

## 2012-05-30 MED ORDER — ATORVASTATIN CALCIUM 10 MG PO TABS
10.0000 mg | ORAL_TABLET | Freq: Every day | ORAL | Status: DC
  Administered 2012-05-30: 10 mg via ORAL
  Filled 2012-05-30 (×2): qty 1

## 2012-05-30 MED ORDER — FLUOXETINE HCL 20 MG PO CAPS
40.0000 mg | ORAL_CAPSULE | Freq: Every day | ORAL | Status: DC
  Administered 2012-05-31: 40 mg via ORAL
  Filled 2012-05-30: qty 2

## 2012-05-30 MED ORDER — SODIUM CHLORIDE 0.9 % IV SOLN: 250.0000 mL | INTRAVENOUS | Status: DC | PRN

## 2012-05-30 MED ORDER — MORPHINE SULFATE 2 MG/ML IJ SOLN: 2.0000 mg | INTRAMUSCULAR | Status: DC | PRN

## 2012-05-30 MED ORDER — OXYCODONE-ACETAMINOPHEN 10-325 MG PO TABS: 1.0000 | ORAL_TABLET | Freq: Three times a day (TID) | ORAL | Status: DC | PRN

## 2012-05-30 MED ORDER — NITROGLYCERIN 0.4 MG SL SUBL
0.4000 mg | SUBLINGUAL_TABLET | SUBLINGUAL | Status: DC | PRN
  Administered 2012-05-30: 0.4 mg via SUBLINGUAL

## 2012-05-30 MED ORDER — ACETAMINOPHEN 325 MG PO TABS
650.0000 mg | ORAL_TABLET | ORAL | Status: DC | PRN
  Administered 2012-05-31: 650 mg via ORAL
  Filled 2012-05-30: qty 2

## 2012-05-30 MED ORDER — LORATADINE 10 MG PO TABS
10.0000 mg | ORAL_TABLET | Freq: Every day | ORAL | Status: DC
  Administered 2012-05-31: 10 mg via ORAL
  Filled 2012-05-30: qty 1

## 2012-05-30 NOTE — ED Notes (Signed)
Informed family that RN would have cardiology repaged

## 2012-05-30 NOTE — ED Notes (Signed)
Pt c/o mid sternal CP that pt describes as a cramp starting this am; pt denies SOB or nausea; pt sts worse with palpation

## 2012-05-30 NOTE — ED Notes (Signed)
Pt reports onset of central chest pain since 0600 am. Reports cramping feeling. Denies pain radiating to arms. Denies shortness of breath, dizziness, diaphoresis. Pain worse with deep breath.

## 2012-05-30 NOTE — H&P (Signed)
History and Physical   Patient ID: Cody Velez MRN: [id number], DOB/AGE: [birth year]   Admit date: 05/30/2012 Date of Consult: 05/30/2012   Primary Physician: Carylon Perches, MD, MD Primary Cardiologist: Valera Castle, MD  Pt. Profile: Cody Velez is a 66yo male with PMHx significant for CAD (nonobstructive- see cardiac history), HTN, HL, severe OSA (on CPAP) and GERD (prior Schatzki's ring dilatation 2009) who presents to [facility] ED with chest pain.   Problem List: Past Medical History  Diagnosis Date  . CAD in native artery   . Dyspnea on exertion   . Hypokalemia   . Rectal bleeding   . Hyperlipidemia   . Allergic rhinitis   . Hypertension     FOLLOWED BY DR TOM WALL.NL CATH 6 YRS AGO  . Depression   . OSA (obstructive sleep apnea)     followed by dr Shelle Iron.see note 08/2011  . Asthma     "when I was a baby"  . Pneumonia 02/2011    "first time ever"  . GERD (gastroesophageal reflux disease)   . Arthritis     "all over"    Past Surgical History  Procedure Date  . Bilateral knee arthroscopy   . Facial cosmetic surgery ` 1985    "broke face playing softball"  . Back surgery 02/14/12    lumbar OR #7; "today redid L1L2; replaced screws; added bone from hip"  . Fracture surgery     "left wrist; broke it; took spur off"     Allergies: No Known Allergies  PAST CARDIAC HISTORY  Exercise Myoview 09/12/2004: normal perfusion exam, no stress induced ischemia, LVEF 56%  Cardiac catheterization with aortogram 05/30/2005: normal left main, 50-60% mid LAD stenosis, D1 40% stenosis, OM2 30% proximal stenosis, OM3 50% proximal stenosis, RCA distal 20% lesion; LVEF 55-60%. 30% stenosis right renal artery.   2D echo 12/22/2006: LVEF 55-60%, no RMAs, no valvular abnormalities  HPI:   He notes that his prior cath was performed due to hypotension of unknown cause. Since following up with Dr. Daleen Squibb in 08/12, he had been doing pretty well.   This morning around 0545, he reports constant  "cramping" SSCP radiating to his bilateral shoulders without remission. He had woke up, showered, at breakfast and was almost out of the door when he experienced the chest pain. He reports associated nausea. Aggravated by position changes and palpation. No attempt at alleviation. No prior episodes of chest pain. Denies shortness of breath, lightheadedness, diaphoresis, orthopnea, PND, LE edema, new cough. Denies fevers, chills or sick contacts. BP well-controlled. No association with eating. No comparison to reflux symptoms. He endorses tenderness to palpation. No heavy exertion recently. At baseline, he is able to go on long walks without incident. This has been affected by chronic back problems and recent back surgery in 02/13.   Upon ED arrival, EKG reveals no acute ischemia. POC troponin-I WNL. CBC and BMET unremarkable. CXR reveals no evidence of acute cardiopulmonary process.   Comfortable, in NAD on interview and exam.   Home Medications: Prior to Admission medications   Medication Sig Start Date End Date Taking? Authorizing Provider  aspirin 81 MG EC tablet Take 81 mg by mouth at bedtime.    Yes Historical Provider, MD  chlorthalidone (HYGROTON) 25 MG tablet Take 25 mg by mouth at bedtime.    Yes Historical Provider, MD  co-enzyme Q-10 50 MG capsule Take 50 mg by mouth daily.   Yes Historical Provider, MD  docusate sodium (COLACE) 100 MG capsule Take  100 mg by mouth daily.   Yes Historical Provider, MD  esomeprazole (NEXIUM) 40 MG capsule Take 40 mg by mouth daily before breakfast.    Yes Historical Provider, MD  FLUoxetine (PROZAC) 40 MG capsule Take 40 mg by mouth daily.   Yes Historical Provider, MD  fluticasone (FLONASE) 50 MCG/ACT nasal spray Place 2 sprays into the nose daily.    Yes Historical Provider, MD  loratadine (CLARITIN) 10 MG tablet Take 10 mg by mouth daily.   Yes Historical Provider, MD  losartan (COZAAR) 50 MG tablet Take 50 mg by mouth daily. 07/23/11 07/22/12 Yes Gaylord Shih, MD  methocarbamol (ROBAXIN) 750 MG tablet Take 750 mg by mouth 3 (three) times daily as needed. For spasms   Yes Historical Provider, MD  Multiple Vitamin (MULITIVITAMIN WITH MINERALS) TABS Take 1 tablet by mouth daily.   Yes Historical Provider, MD  oxyCODONE-acetaminophen (PERCOCET) 10-325 MG per tablet Take 1 tablet by mouth 3 (three) times daily as needed. For pain   Yes Historical Provider, MD  potassium chloride (KLOR-CON) 20 MEQ packet Take 20 mEq by mouth 2 (two) times daily.    Yes Historical Provider, MD  rosuvastatin (CRESTOR) 5 MG tablet Take 5 mg by mouth at bedtime.    Yes Historical Provider, MD   Inpatient Medications:     . nitroGLYCERIN  0.4 mg Sublingual Once  . nitroGLYCERIN  0.4 mg Sublingual Once    (Not in a hospital admission)  Family History  Problem Relation Age of Onset  . Emphysema Father   . Heart failure Father   . Lung cancer Father   . Emphysema Maternal Grandmother   . Stroke Maternal Grandmother   . Asthma Other     grandson  . Heart disease Paternal Grandfather   . Cancer Mother     intestinal/GI   . Stroke Mother   . Stroke Sister   . Anesthesia problems Neg Hx   . Hypotension Neg Hx   . Malignant hyperthermia Neg Hx   . Pseudochol deficiency Neg Hx      History   Social History  . Marital Status: Married    Spouse Name: N/A    Number of Children: 3  . Years of Education: N/A   Occupational History  . [employer]    Social History Main Topics  . Smoking status: Never Smoker   . Smokeless tobacco: Current User    Types: Chew  . Alcohol Use: 0.0 oz/week    0 Glasses of wine per week     Seldom  . Drug Use: Yes    Special: Marijuana     "last used marijuana ~ 1969"  . Sexually Active: No   Other Topics Concern  . 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   Review of Systems: General: negative for chills, fever, night sweats or weight changes.    Cardiovascular: positive for chest pain and nausea, negative for dyspnea on exertion, edema, orthopnea, palpitations, paroxysmal nocturnal dyspnea or shortness of breath Dermatological: negative for rash Respiratory: positive for chronic cough, negative for wheezing Urologic: negative for hematuria Abdominal: negative for vomiting, diarrhea, bright red blood per rectum, melena, or hematemesis Neurologic: negative for visual changes, syncope, or dizziness All other systems reviewed and are otherwise negative except as noted above.  Physical Exam: Blood pressure 155/86, pulse 65, temperature 98.2 F (36.8 C), temperature source Oral, resp. rate 16, SpO2 94.00%.  General:  Well developed, well nourished, in no acute distress. Head: Normocephalic, atraumatic, sclera non-icteric, no xanthomas, nares are without discharge. Neck: Negative for carotid bruits. JVD not elevated. Lungs: Clear bilaterally to auscultation without wheezes, rales, or rhonchi. Breathing is unlabored. Heart: RRR with S1 S2. No murmurs, rubs, or gallops appreciated. Abdomen: Soft, non-tender, non-distended with normoactive bowel sounds. No hepatomegaly. No rebound/guarding. No obvious abdominal masses. Msk:  Acute tenderness to palpation of sternal notch. Strength and tone appears normal for age. Extremities: No clubbing, cyanosis or edema.  Distal pedal pulses are 2+ and equal bilaterally. Neuro: Alert and oriented X 3. Moves all extremities spontaneously. Psych:  Responds to questions appropriately with a normal affect.  Labs: Recent Labs  [facility] 05/30/12 0901   WBC 5.8   HGB 12.4*   HCT 39.2   MCV 83.2   PLT 314   Lab 05/30/12 0901  NA 138  K 4.4  CL 103  CO2 28  BUN 8  CREATININE 1.06  CALCIUM 9.3  PROT --  BILITOT --  ALKPHOS --  ALT --  AST --  AMYLASE --  LIPASE --  GLUCOSE 92   Radiology/Studies: Dg Chest Port 1 View  05/30/2012  *RADIOLOGY REPORT*  Clinical Data: Chest pain, history  hypertension, coronary artery disease, hyperlipidemia, asthma  PORTABLE CHEST - 1 VIEW  Comparison: Portable exam 0912 hours compared to 04/21/2011  Findings: Enlargement of cardiac silhouette. Tortuous aorta. Pulmonary vascularity normal. Chronic peribronchial thickening without infiltrate or effusion. No pneumothorax. No acute osseous findings.  IMPRESSION: Enlargement of cardiac silhouette. Chronic bronchitic changes.  Original Report Authenticated By: Lollie Marrow, M.D.   EKG: NSR, 69 bpm, no ST-T wave changes  ASSESSMENT AND PLAN:   Cody Velez is a 66yo male with PMHx significant for CAD (nonobstructive- see cardiac history), HTN, HL, severe OSA (on CPAP) and GERD (prior Schatzki's ring dilatation 2009) who presents to [facility] ED with chest pain.   1. Chest pain- atypical. The patient notes no prior episodes of chest pain. Woke up this morning and occurred just prior to leaving for work. Described as "cramping" but also acutely tender to palpation of sternum. No alleviating factors. Aggravated by position changes. Had been functioning well from an activity standpoint until limited by chronic back pain. EKG largely unchanged from 2008 without signs of ischemia. POC troponin-I WNL. Low-suspicion for cardiac etiology. CXR reveals cardiomegaly, otherwise no acute process. Afebrile. No leukocytosis. No infectious respiratory symptoms as noted in HPI. Would favor musculoskeletal/inflammatory etiology. Patient does however have a history of CAD and significant cardiac risk factors. Will rule out for ACS.   - Admit to telemetry  - Cycle cardiac biomarkers  - Continue ASA, ARB, statin  - Will add NTG SL PRN  - 2D echo while inpatient  - Should he rule-out, would favor discharge on trial of NSAIDs with outpatient stress testing. Lexiscan Myoview would be appropriate given activity restrictions from chronic back pain.    2. CAD- see above. This has largely been stable since 2005-2006. He has not endorsed  ischemic symptoms since that time. Low-suspicion for cardiac chest pain.   - Continue ASA, ARB, statin, NTG SL PRN as above  - Heart healthy diet  3. Hypertension- fairly well-controlled in the ED.   - Continue outpatient antihypertensives  - Will monitor BP  4. Hyperlipidemia- states this is well-controlled managed by PCP.   - Continue statin  5. OSA- severe. Will re-evualute for pulmonary hypertension and increased R-sided pressures with  echo.  - CPAP while inpatient  6. GERD  - Continue PPI  7. Chronic pain- prior back surgeries.  - Continue outpatient analgesics, muscle relaxers  Signed, R. Hurman Horn, PA-C 05/30/2012, 3:09 PM   Patient seen, examined. Available data reviewed. Agree with findings, assessment, and plan as outlined by Hurman Horn, PA-C. The patient was independently interviewed and examined. His chest pain is somewhat atypical but he does have a history of moderate CAD in 2006 and multiple ongoing risk factors with HTN, hyperlipidemia, OSA, and obesity. Recommend observation admission with serial enzymes and EKG. If negative he can be discharged in the am with an outpatient Myoview stress test. Otherwise as above.  Tonny Bollman, M.D. 05/30/2012 4:22 PM

## 2012-05-30 NOTE — ED Provider Notes (Signed)
History     CSN: [id number]  Arrival date & time 05/30/12  0807   First MD Initiated Contact with Patient 05/30/12 0813      Chief Complaint  Patient presents with  . Chest Pain    (Consider location/radiation/quality/duration/timing/severity/associated sxs/prior treatment) HPI... anterior chest pain since 545 this morning described as cramping. No radiation. No dyspnea, nausea, diaphoresis.  Patient has hypertension but no diabetes. Cholesterol normal. Does not smoke. Nothing makes it better or worse. Symptoms started while he was at rest  Past Medical History  Diagnosis Date  . CAD in native artery   . Dyspnea on exertion   . Hypokalemia   . Rectal bleeding   . Hyperlipidemia   . Allergic rhinitis   . Hypertension     FOLLOWED BY DR TOM WALL.NL CATH 6 YRS AGO  . Depression   . Coronary artery disease   . OSA (obstructive sleep apnea)     followed by dr Shelle Iron.see note 08/2011  . Asthma     "when I was a baby"  . Pneumonia 02/2011    "first time ever"  . GERD (gastroesophageal reflux disease)   . Arthritis     "all over"    Past Surgical History  Procedure Date  . Bilateral knee arthroscopy   . Facial cosmetic surgery ` 1985    "broke face playing softball"  . Back surgery 02/14/12    lumbar OR #7; "today redid L1L2; replaced screws; added bone from hip"  . Fracture surgery     "left wrist; broke it; took spur off"    Family History  Problem Relation Age of Onset  . Emphysema Father   . Heart failure Father   . Lung cancer Father   . Emphysema Maternal Grandmother   . Stroke Maternal Grandmother   . Asthma Other     grandson  . Heart disease Paternal Grandfather   . Cancer Mother     intestinal/GI   . Stroke Mother   . Stroke Sister   . Anesthesia problems Neg Hx   . Hypotension Neg Hx   . Malignant hyperthermia Neg Hx   . Pseudochol deficiency Neg Hx     History  Substance Use Topics  . Smoking status: Never Smoker   . Smokeless tobacco:  Current User    Types: Chew  . Alcohol Use: 0.0 oz/week    0 Glasses of wine per week      Review of Systems  All other systems reviewed and are negative.    Allergies  Review of patient's allergies indicates no known allergies.  Home Medications   Current Outpatient Rx  Name Route Sig Dispense Refill  . ASPIRIN 81 MG PO TBEC Oral Take 81 mg by mouth at bedtime.     . CHLORTHALIDONE 25 MG PO TABS Oral Take 25 mg by mouth at bedtime.     Marland Kitchen CO-ENZYME Q-10 50 MG PO CAPS Oral Take 50 mg by mouth daily.    Marland Kitchen DOCUSATE SODIUM 100 MG PO CAPS Oral Take 100 mg by mouth daily.    Marland Kitchen ESOMEPRAZOLE MAGNESIUM 40 MG PO CPDR Oral Take 40 mg by mouth daily before breakfast.     . FLUOXETINE HCL 40 MG PO CAPS Oral Take 40 mg by mouth daily.    Marland Kitchen FLUTICASONE PROPIONATE 50 MCG/ACT NA SUSP Nasal Place 2 sprays into the nose daily.     Marland Kitchen LORATADINE 10 MG PO TABS Oral Take 10 mg by mouth daily.    Marland Kitchen  LOSARTAN POTASSIUM 50 MG PO TABS Oral Take 50 mg by mouth daily.    Marland Kitchen METHOCARBAMOL 750 MG PO TABS Oral Take 750 mg by mouth 3 (three) times daily as needed. For spasms    . ADULT MULTIVITAMIN W/MINERALS CH Oral Take 1 tablet by mouth daily.    . OXYCODONE-ACETAMINOPHEN 10-325 MG PO TABS Oral Take 1 tablet by mouth 3 (three) times daily as needed. For pain    . POTASSIUM CHLORIDE 20 MEQ PO PACK Oral Take 20 mEq by mouth 2 (two) times daily.     Marland Kitchen ROSUVASTATIN CALCIUM 5 MG PO TABS Oral Take 5 mg by mouth at bedtime.       BP 159/86  Pulse 68  Temp(Src) 98.2 F (36.8 C) (Oral)  Resp 16  SpO2 97%  Physical Exam  Nursing note and vitals reviewed. Constitutional: He is oriented to person, place, and time. He appears well-developed and well-nourished.  HENT:  Head: Normocephalic and atraumatic.  Eyes: Conjunctivae and EOM are normal. Pupils are equal, round, and reactive to light.  Neck: Normal range of motion. Neck supple.  Cardiovascular: Normal rate and regular rhythm.   Pulmonary/Chest: Effort  normal and breath sounds normal.  Abdominal: Soft. Bowel sounds are normal.  Musculoskeletal: Normal range of motion.  Neurological: He is alert and oriented to person, place, and time.  Skin: Skin is warm and dry.  Psychiatric: He has a normal mood and affect.    ED Course  Procedures (including critical care time)   Labs Reviewed  CBC  DIFFERENTIAL  BASIC METABOLIC PANEL   No results found.   No diagnosis found.   Date: 05/30/2012  Rate: 69  Rhythm: normal sinus rhythm  QRS Axis: left  Intervals: normal  ST/T Wave abnormalities: normal  Conduction Disutrbances:right bundle branch block  Narrative Interpretation:   Old EKG Reviewed: changes noted   MDM  Patient is moderate risk factor profile. Will discuss with cardiology.        Donnetta Hutching, MD 05/30/12 1005

## 2012-05-30 NOTE — ED Notes (Signed)
Cardiology returned page; stated that there should be someone on the floor; informed family that cardiology should be with them shortly.

## 2012-05-30 NOTE — Progress Notes (Signed)
Pt rating chest pain a 5/10 on the 0-10 scale.  PA, Rhonda Barrett notified.  New orders received.

## 2012-05-31 ENCOUNTER — Encounter (HOSPITAL_COMMUNITY): Payer: Self-pay | Admitting: *Deleted

## 2012-05-31 DIAGNOSIS — I2510 Atherosclerotic heart disease of native coronary artery without angina pectoris: Secondary | ICD-10-CM

## 2012-05-31 DIAGNOSIS — I369 Nonrheumatic tricuspid valve disorder, unspecified: Secondary | ICD-10-CM

## 2012-05-31 DIAGNOSIS — R079 Chest pain, unspecified: Secondary | ICD-10-CM

## 2012-05-31 LAB — TSH: TSH: 1.03 u[IU]/mL (ref 0.350–4.500)

## 2012-05-31 LAB — BASIC METABOLIC PANEL
BUN: 10 mg/dL (ref 6–23)
CO2: 27 mEq/L (ref 19–32)
Calcium: 9.3 mg/dL (ref 8.4–10.5)
Chloride: 100 mEq/L (ref 96–112)
Creatinine, Ser: 1.06 mg/dL (ref 0.50–1.35)
GFR calc Af Amer: 83 mL/min — ABNORMAL LOW (ref >90)
GFR calc non Af Amer: 72 mL/min — ABNORMAL LOW (ref >90)
Glucose, Bld: 93 mg/dL (ref 70–99)
Potassium: 3.7 mEq/L (ref 3.5–5.1)
Sodium: 138 mEq/L (ref 135–145)

## 2012-05-31 LAB — CBC
HCT: 41.8 % (ref 39.0–52.0)
Hemoglobin: 13.1 g/dL (ref 13.0–17.0)
MCH: 26.3 pg (ref 26.0–34.0)
MCHC: 31.3 g/dL (ref 30.0–36.0)
MCV: 83.9 fL (ref 78.0–100.0)
Platelets: 351 10*3/uL (ref 150–400)
RBC: 4.98 MIL/uL (ref 4.22–5.81)
RDW: 14.5 % (ref 11.5–15.5)
WBC: 5.8 10*3/uL (ref 4.0–10.5)

## 2012-05-31 LAB — CARDIAC PANEL(CRET KIN+CKTOT+MB+TROPI)
CK, MB: 2.3 ng/mL (ref 0.3–4.0)
Relative Index: INVALID (ref 0.0–2.5)
Total CK: 90 U/L (ref 7–232)
Troponin I: <0.3 ng/mL (ref <0.30)

## 2012-05-31 MED ORDER — NITROGLYCERIN 0.4 MG SL SUBL: 0.4000 mg | SUBLINGUAL_TABLET | SUBLINGUAL | Status: DC | PRN

## 2012-05-31 NOTE — Progress Notes (Signed)
Pt and pt wife provided with d.c instructions and education. Pt and wife verbalized understanding. Pt has a scheduled OP myoview taht he plans to attend. Instructions were given in detail to patient. Pt verbalized understanding. IV removed with tip in tact. Heart monitor returned to front. No needs at this time. Pt left by foot with with for home. Ramond Craver, RN

## 2012-05-31 NOTE — Discharge Instructions (Signed)
   You have a Stress Test scheduled on 06/05/12 @ 9:45 at [facility] in [place]. Your doctor has ordered this test to get a better idea of how your heart works.  Please arrive 15 minutes early for paperwork.   Location: [address] [place], [place] [address] [phone]  Instructions:  No food/drink after midnight the night before.   No caffeine/decaf products 24 hours before, including medicines such as Excedrin or Goody Powders. Call if there are any questions.   Wear comfortable clothes and shoes.   It is OK to take your morning meds with a sip of water EXCEPT for those types of medicines listed below or otherwise instructed.  Special Medication Instructions:  Beta blockers such as metoprolol (Lopressor/Toprol XL), atenolol (Tenormin), carvedilol (Coreg), nebivolol (Bystolic), propranolol (Inderal) should not be taken for 24 hours before the test.  Calcium channel blockers such as diltiazem (Cardizem) or verapmil (Calan) should not be taken for 24 hours before the test.  Remove nitroglycerin patches and do not take nitrate preparations such as Imdur/isosorbide the day of your test.  No Persantine/Theophylline or Aggrenox medicines should be used within 24 hours of the test.   What To Expect: Your test will be done over 2 days due to your weight. On the first day, the technician will inject a small amount of radioactive tracer into your arm through an IV while you are resting quietly. This helps [place] to form pictures of your heart. You will likely only feel a sting from the IV. After a waiting period, resting pictures will be obtained under a big camera. These are the "before" pictures.  On the next day, you will be prepped for the stress portion of the test. This will involve receiving a medicine that helps to dilate blood vessels in your heart to simulate the effect of exercise on your heart. When you receive the medicine through your IV you may experience  temporary nausea, flushing, shortness of breath and sometimes chest discomfort or vomiting. This is typically short-lived and usually resolves quickly. Your blood pressure and heart rate will be monitored, and we will be watching your EKG on a computer screen for any changes. During this portion of the test, the radiologist will inject another small amount of radioactive tracer into your IV. After a waiting period, you will undergo a second set of pictures. These are the "after" pictures.  The doctor reading the test will compare the before-and-after images to look for evidence of heart blockages or heart weakness.

## 2012-05-31 NOTE — Progress Notes (Signed)
  Echocardiogram 2D Echocardiogram has been performed.  Cody Velez 05/31/2012, 2:31 PM

## 2012-05-31 NOTE — Discharge Summary (Signed)
Discharge Summary   Patient ID: Cody Velez MRN: [id number], DOB/AGE: 66-Jun-1947 66 y.o.  Primary MD: Carylon Perches, MD Primary Cardiologist: Valera Castle, MD Admit date: 05/30/2012 D/C date:     05/31/2012      Primary Discharge Diagnoses:  1. Chest Pain, Atypical  - Likely musculoskeletal  - No objective evidence of ischemia  - Echo - normal LV systolic function, EF 55-60%, no RWMAs, mild LAE, no significant valvular abnormalities  - Outpatient Lexiscan Myoview  Secondary Discharge Diagnoses:  1. CAD - cath 2006 normal left main, 50-60% mid LAD stenosis, D1 40% stenosis, OM2 30% proximal stenosis, OM3 50% proximal stenosis, RCA distal 20% lesion; LVEF 55-60%. 30% stenosis right renal artery 2. HTN 3. HLD 4. Smokeless Tobacco Abuse 5. OSA - CPAP, followed by Dr. Shelle Iron 6. GERD - Schatzki's ring dilatation 2009 7. Chronic back pain 8. Rectal bleeding  9. Allergic rhinitis  10. Depression  11. Bilateral knee arthroscopy  12. Facial cosmetic surgery 1985 "broke face playing softball" 13. Back surgery 02/14/12 lumbar OR #7; "today redid L1L2; replaced screws; added bone from hip 14. Fracture surgery "left wrist; broke it; took spur off  Allergies No Known Allergies  Diagnostic Studies/Procedures:   05/31/12 - 2D Echocardiogram Study Conclusions: - Left ventricle: The cavity size was normal. Systolic function was normal. The estimated ejection fraction was in the range of 55% to 60%. Wall motion was normal; there were no regional wall motion abnormalities. - Left atrium: The atrium was mildly dilated. - Atrial septum: No defect or patent foramen ovale was identified.   History of Present Illness: 66 y.o. male w/ the above medical problems who presented to [facility] on 05/30/12 with complaints of chest pain. On the morning of presentation he reported constant "cramping" SSCP radiating to his bilateral shoulders without remission prompting him to present to the  ED.  Hospital Course: In the ED EKG revealed NSR with no acute ST changes. CXR was without acute cardiopulmonary abnormalities. Labs were significant for normal poc troponin, unremarkable BMET and CBC. He was admitted for further evaluation and treatment.   Cardiac enzymes were cycled and remained negative. There were no objective findings to suggest cardiac ischemia. His symptoms were atypical, nonexertional, and felt likely musculoskeletal in origin. Recommendations were made for an outpatient Lexiscan Myoview. Echo revealed normal LV systolic function, EF 55-60%, no RWMAs, mild LAE, no significant valvular abnormalities. He was able to ambulate without further chest pain. He was seen and evaluated by Dr. Kirke Corin who felt he was stable for discharge home with plans for follow up as scheduled below.  Discharge Vitals: Blood pressure 143/77, pulse 67, temperature 97.8 F (36.6 C), temperature source Oral, resp. rate 20, height 6\' 4"  (1.93 m), weight 275 lb 9.2 oz (125 kg), SpO2 99.00%.  Labs: Component Value Date   WBC 5.8 05/31/2012   HGB 13.1 05/31/2012   HCT 41.8 05/31/2012   MCV 83.9 05/31/2012   PLT 351 05/31/2012    Lab 05/31/12 0500  NA 138  K 3.7  CL 100  CO2 27  BUN 10  CREATININE 1.06  CALCIUM 9.3  GLUCOSE 93   Basename 05/31/12 0521 05/30/12 2250 05/30/12 1819  CKTOTAL 90 106 115  CKMB 2.3 2.5 2.8  TROPONINI <0.30 <0.30 <0.30   Component Value Date   CHOL 175 05/30/2012   HDL 28* 05/30/2012   LDLCALC 108* 05/30/2012   TRIG 194* 05/30/2012     05/30/2012 19:12  TSH 1.030  Discharge Medications   Medication List  As of 05/31/2012  1:50 PM   TAKE these medications         aspirin 81 MG EC tablet   Take 81 mg by mouth at bedtime.      chlorthalidone 25 MG tablet   Commonly known as: HYGROTON   Take 25 mg by mouth at bedtime.      co-enzyme Q-10 50 MG capsule   Take 50 mg by mouth daily.      docusate sodium 100 MG capsule   Commonly known as: COLACE   Take 100  mg by mouth daily.      esomeprazole 40 MG capsule   Commonly known as: NEXIUM   Take 40 mg by mouth daily before breakfast.      FLUoxetine 40 MG capsule   Commonly known as: PROZAC   Take 40 mg by mouth daily.      fluticasone 50 MCG/ACT nasal spray   Commonly known as: FLONASE   Place 2 sprays into the nose daily.      loratadine 10 MG tablet   Commonly known as: CLARITIN   Take 10 mg by mouth daily.      losartan 50 MG tablet   Commonly known as: COZAAR   Take 50 mg by mouth daily.      methocarbamol 750 MG tablet   Commonly known as: ROBAXIN   Take 750 mg by mouth 3 (three) times daily as needed. For spasms      multivitamin with minerals Tabs   Take 1 tablet by mouth daily.      nitroGLYCERIN 0.4 MG SL tablet   Commonly known as: NITROSTAT   Place 1 tablet (0.4 mg total) under the tongue every 5 (five) minutes as needed for chest pain (up to 3 doses).      oxyCODONE-acetaminophen 10-325 MG per tablet   Commonly known as: PERCOCET   Take 1 tablet by mouth 3 (three) times daily as needed. For pain      potassium chloride 20 MEQ packet   Commonly known as: KLOR-CON   Take 20 mEq by mouth 2 (two) times daily.      rosuvastatin 5 MG tablet   Commonly known as: CRESTOR   Take 5 mg by mouth at bedtime.            Disposition   Discharge Orders    Future Appointments: Provider: Department: Dept Phone: Center:   06/05/2012 9:45 AM [facility] (Nuc Treadm) [facility] Nuclear [facility]  None   06/14/2012 9:45 AM Dyann Kief, PA [facility] [place] [phone] [facility]   09/07/2012 9:00 AM Barbaraann Share, MD [facility] [phone] None     Future Orders Please Complete By Expires   Diet - low sodium heart healthy      Increase activity slowly      Discharge instructions      Comments:   **PLEASE REMEMBER TO BRING ALL OF YOUR MEDICATIONS TO EACH OF YOUR FOLLOW-UP OFFICE VISITS.     Follow-up Information    Follow up with [facility] on  06/05/2012. (Stress Test @ 9:45. Our office will call you with specific instructions)    Contact information:   [address] [place] [address] [phone]      Follow up with Jacolyn Reedy, PA on 06/14/2012. (9:45)    Contact information:   [facility] [address] [place] [address]  [phone]           Outstanding Labs/Studies:  1. Lexiscan Myoview  Duration of Discharge Encounter: Greater than 30 minutes including physician and PA time.  Signed, Eulah Walkup PA-C 05/31/2012, 1:50 PM

## 2012-05-31 NOTE — Progress Notes (Signed)
SUBJECTIVE: He is chest pain free since last night.    Filed Vitals:   05/30/12 2000 05/31/12 0100 05/31/12 0149 05/31/12 0500  BP: 131/72   143/77  Pulse: 67 65 65 67  Temp: 98.2 F (36.8 C)   97.8 F (36.6 C)  TempSrc:      Resp: 20 20 20 20   Height:      Weight:      SpO2: 96% 97% 97% 99%    Intake/Output Summary (Last 24 hours) at 05/31/12 0747 Last data filed at 05/31/12 0500  Gross per 24 hour  Intake      3 ml  Output    975 ml  Net   -972 ml    LABS: Basic Metabolic Panel:  Basename 05/31/12 0500 05/30/12 1912 05/30/12 0901  NA 138 -- 138  K 3.7 -- 4.4  CL 100 -- 103  CO2 27 -- 28  GLUCOSE 93 -- 92  BUN 10 -- 8  CREATININE 1.06 1.02 --  CALCIUM 9.3 -- 9.3  MG -- -- --  PHOS -- -- --   Liver Function Tests: No results found for this basename: AST:2,ALT:2,ALKPHOS:2,BILITOT:2,PROT:2,ALBUMIN:2 in the last 72 hours No results found for this basename: LIPASE:2,AMYLASE:2 in the last 72 hours CBC:  Basename 05/31/12 0500 05/30/12 1912 05/30/12 0901  WBC 5.8 6.2 --  NEUTROABS -- -- 3.3  HGB 13.1 13.1 --  HCT 41.8 41.5 --  MCV 83.9 83.7 --  PLT 351 327 --   Cardiac Enzymes:  Basename 05/31/12 0521 05/30/12 2250 05/30/12 1819  CKTOTAL 90 106 115  CKMB 2.3 2.5 2.8  CKMBINDEX -- -- --  TROPONINI <0.30 <0.30 <0.30   BNP: No components found with this basename: POCBNP:3 D-Dimer: No results found for this basename: DDIMER:2 in the last 72 hours Hemoglobin A1C: No results found for this basename: HGBA1C in the last 72 hours Fasting Lipid Panel:  Basename 05/30/12 1913  CHOL 175  HDL 28*  LDLCALC 108*  TRIG 194*  CHOLHDL 6.3  LDLDIRECT --   Thyroid Function Tests:  Basename 05/30/12 1912  TSH 1.030  T4TOTAL --  T3FREE --  THYROIDAB --   Anemia Panel: No results found for this basename: VITAMINB12,FOLATE,FERRITIN,TIBC,IRON,RETICCTPCT in the last 72 hours   PHYSICAL EXAM General: Well developed, well nourished, in no acute  distress HEENT:  Normocephalic and atramatic Neck:  No JVD.  Lungs: Clear bilaterally to auscultation and percussion. Heart: HRRR . Normal S1 and S2 without gallops or murmurs.  Abdomen: Bowel sounds are positive, abdomen soft and non-tender  Msk:  Back normal, normal gait. Normal strength and tone for age. Extremities: No clubbing, cyanosis or edema.   Neuro: Alert and oriented X 3. Psych:  Good affect, responds appropriately  TELEMETRY: Reviewed telemetry pt in : NSR.   ASSESSMENT AND PLAN: 1. Chest pain- atypical. No exertional chest pain. Seems to be likely musculoskeletal. No evidence of ACS.  -ambulate today. If chest pain free, can discharge home. Recommend outpatient Lexiscan Myoview .  2. CAD- This has largely been stable since 2005-2006. He has not endorsed ischemic symptoms since that time.  - Continue ASA, ARB, statin, NTG SL PRN as above  - Heart healthy diet  3. Hypertension- fairly well-controlled in the ED.  - Continue outpatient antihypertensives   4. Hyperlipidemia- states this is well-controlled managed by PCP.  - Continue statin  5. OSA- severe. Will re-evualute for pulmonary hypertension and increased R-sided pressures with echo.  - CPAP while inpatient  6. GERD  - Continue PPI  7. Chronic pain- prior back surgeries.  - Continue outpatient analgesics, muscle relaxers   Lorine Bears, MD, [facility] 05/31/2012 7:47 AM

## 2012-06-01 ENCOUNTER — Telehealth: Payer: Self-pay | Admitting: Cardiology

## 2012-06-01 NOTE — Telephone Encounter (Signed)
New Problem:    Patient called in wanting to know the results of the ECHO he had done in the hospital yesterday.  Please call back.

## 2012-06-01 NOTE — Telephone Encounter (Signed)
Advised pt of results of echo and gave instructions for Myoview scheduled for Monday.

## 2012-06-02 DIAGNOSIS — M51369 Other intervertebral disc degeneration, lumbar region without mention of lumbar back pain or lower extremity pain: Secondary | ICD-10-CM | POA: Insufficient documentation

## 2012-06-02 DIAGNOSIS — I2510 Atherosclerotic heart disease of native coronary artery without angina pectoris: Secondary | ICD-10-CM | POA: Insufficient documentation

## 2012-06-02 DIAGNOSIS — F32A Depression, unspecified: Secondary | ICD-10-CM | POA: Insufficient documentation

## 2012-06-05 ENCOUNTER — Other Ambulatory Visit (HOSPITAL_COMMUNITY): Payer: Self-pay | Admitting: Internal Medicine

## 2012-06-05 ENCOUNTER — Ambulatory Visit (HOSPITAL_COMMUNITY): Payer: Federal, State, Local not specified - PPO | Attending: Cardiology | Admitting: Radiology

## 2012-06-05 VITALS — BP 135/91 | HR 63 | Ht 75.0 in | Wt 300.0 lb

## 2012-06-05 DIAGNOSIS — R55 Syncope and collapse: Secondary | ICD-10-CM

## 2012-06-05 DIAGNOSIS — I2510 Atherosclerotic heart disease of native coronary artery without angina pectoris: Secondary | ICD-10-CM

## 2012-06-05 DIAGNOSIS — E785 Hyperlipidemia, unspecified: Secondary | ICD-10-CM | POA: Insufficient documentation

## 2012-06-05 DIAGNOSIS — R42 Dizziness and giddiness: Secondary | ICD-10-CM | POA: Insufficient documentation

## 2012-06-05 DIAGNOSIS — R0609 Other forms of dyspnea: Secondary | ICD-10-CM | POA: Insufficient documentation

## 2012-06-05 DIAGNOSIS — R109 Unspecified abdominal pain: Secondary | ICD-10-CM

## 2012-06-05 DIAGNOSIS — R0989 Other specified symptoms and signs involving the circulatory and respiratory systems: Secondary | ICD-10-CM | POA: Insufficient documentation

## 2012-06-05 DIAGNOSIS — I739 Peripheral vascular disease, unspecified: Secondary | ICD-10-CM | POA: Insufficient documentation

## 2012-06-05 DIAGNOSIS — R079 Chest pain, unspecified: Secondary | ICD-10-CM | POA: Insufficient documentation

## 2012-06-05 DIAGNOSIS — Z87891 Personal history of nicotine dependence: Secondary | ICD-10-CM | POA: Insufficient documentation

## 2012-06-05 DIAGNOSIS — R0602 Shortness of breath: Secondary | ICD-10-CM

## 2012-06-05 DIAGNOSIS — I10 Essential (primary) hypertension: Secondary | ICD-10-CM | POA: Insufficient documentation

## 2012-06-05 DIAGNOSIS — E669 Obesity, unspecified: Secondary | ICD-10-CM | POA: Insufficient documentation

## 2012-06-05 MED ORDER — REGADENOSON 0.4 MG/5ML IV SOLN
0.4000 mg | Freq: Once | INTRAVENOUS | Status: AC
  Administered 2012-06-05: 0.4 mg via INTRAVENOUS

## 2012-06-05 MED ORDER — TECHNETIUM TC 99M TETROFOSMIN IV KIT
11.0000 | PACK | Freq: Once | INTRAVENOUS | Status: AC | PRN
  Administered 2012-06-05: 11 via INTRAVENOUS

## 2012-06-05 MED ORDER — TECHNETIUM TC 99M TETROFOSMIN IV KIT
33.0000 | PACK | Freq: Once | INTRAVENOUS | Status: AC | PRN
  Administered 2012-06-05: 33 via INTRAVENOUS

## 2012-06-05 NOTE — Progress Notes (Signed)
[facility] SITE 3 NUCLEAR MED [address] [place] [address] [phone]  Cardiology Nuclear Med Study  Cody Velez is a 66 y.o. male     MRN : [id number]     DOB: 1946/03/19  Procedure Date: 06/05/2012  Nuclear Med Background Indication for Stress Test:  Evaluation for Ischemia and 05/30/12 Post Hospital: Chest Pain, (-) enzymes History:  '05 MPS:No ischemia, EF=56%; '06 Cath:N/O CAD, EF=55-60%; 6/13 Echo:EF=55-60% Cardiac Risk Factors: History of Smoking, Hypertension, Lipids, Obesity and PVD  Symptoms:  Chest Pain>(B) Shoulders (last episode of chest discomfort:none since discharge), Dizziness and DOE   Nuclear Pre-Procedure Caffeine/Decaff Intake:  None NPO After: 6:00pm   Lungs:  clear O2 Sat: 98% on room air. IV 0.9% NS with Angio Cath:  22g  IV Site: R Hand  IV Started by:  Stanton Kidney, EMT-P  Chest Size (in):  52 Cup Size: n/a  Height: 6\' 3"  (1.905 m)  Weight:  300 lb (136.079 kg)  BMI:  Body mass index is 37.50 kg/(m^2). Tech Comments:  NA    Nuclear Med Study 1 or 2 day study: 1 day  Stress Test Type:  Treadmill/Lexiscan  Reading MD: Olga Millers, MD  Order Authorizing Provider:  Valera Castle, MD  Resting Radionuclide: Technetium 77m Tetrofosmin  Resting Radionuclide Dose: 11.0 mCi   Stress Radionuclide:  Technetium 67m Tetrofosmin  Stress Radionuclide Dose: 33.0 mCi           Stress Protocol Rest HR: 63 Stress HR: 107  Rest BP: 135/91 Stress BP: 160/89  Exercise Time (min): 2:00 METS: n/a   Predicted Max HR: 155 bpm % Max HR: 69.03 bpm Rate Pressure Product: 78469   Dose of Adenosine (mg):  n/a Dose of Lexiscan: 0.4 mg  Dose of Atropine (mg): n/a Dose of Dobutamine: n/a mcg/kg/min (at max HR)  Stress Test Technologist: Smiley Houseman, CMA-N  Nuclear Technologist:  Domenic Polite, CNMT     Rest Procedure:  Myocardial perfusion imaging was performed at rest 45 minutes following the intravenous administration of Technetium 43m  Tetrofosmin.  Rest ECG: IRBBB  Stress Procedure:  The patient received IV Lexiscan 0.4 mg over 15-seconds with concurrent low level exercise and then Technetium 46m Tetrofosmin was injected at 30-seconds while the patient continued walking one more minute. There were no significant changes with Lexiscan. Quantitative spect images were obtained after a 45-minute delay.  Stress ECG: No significant ST segment change suggestive of ischemia.  QPS Raw Data Images:  Acquisition technically good; mild LVE. Stress Images:  Normal homogeneous uptake in all areas of the myocardium. Rest Images:  Normal homogeneous uptake in all areas of the myocardium. Subtraction (SDS):  No evidence of ischemia. Transient Ischemic Dilatation (Normal <1.22):  1.07 Lung/Heart Ratio (Normal <0.45):  0.34  Quantitative Gated Spect Images QGS EDV:  137 ml QGS ESV:  58 ml  Impression Exercise Capacity:  Lexiscan with low level exercise. BP Response:  Normal blood pressure response. Clinical Symptoms:  No chest pain or dyspnea. ECG Impression:  No significant ST segment change suggestive of ischemia. Comparison with Prior Nuclear Study: No images to compare  Overall Impression:  Normal stress nuclear study.  LV Ejection Fraction: 58%.  LV Wall Motion:  NL LV Function; NL Wall Motion  Olga Millers

## 2012-06-12 NOTE — Progress Notes (Signed)
Pt notified of nl stress test results.

## 2012-06-14 ENCOUNTER — Encounter: Payer: Federal, State, Local not specified - PPO | Admitting: Physician Assistant

## 2012-06-16 ENCOUNTER — Ambulatory Visit (INDEPENDENT_AMBULATORY_CARE_PROVIDER_SITE_OTHER): Payer: Federal, State, Local not specified - PPO | Admitting: Physician Assistant

## 2012-06-16 ENCOUNTER — Encounter: Payer: Self-pay | Admitting: Physician Assistant

## 2012-06-16 VITALS — BP 120/80 | HR 70 | Ht 75.0 in | Wt 305.0 lb

## 2012-06-16 DIAGNOSIS — E7800 Pure hypercholesterolemia, unspecified: Secondary | ICD-10-CM

## 2012-06-16 DIAGNOSIS — R079 Chest pain, unspecified: Secondary | ICD-10-CM

## 2012-06-16 DIAGNOSIS — I10 Essential (primary) hypertension: Secondary | ICD-10-CM

## 2012-06-16 DIAGNOSIS — I2510 Atherosclerotic heart disease of native coronary artery without angina pectoris: Secondary | ICD-10-CM

## 2012-06-16 NOTE — Patient Instructions (Addendum)
Your physician wants you to follow-up in: 6 MONTHS WITH DR. WALL.  You will receive a reminder letter in the mail two months in advance. If you don't receive a letter, please call our office to schedule the follow-up appointment.   NO CHANGES AT THIS TIME

## 2012-06-16 NOTE — Progress Notes (Signed)
[address] [place], [place]  [address] Phone: [phone] Fax:  [phone]  Date:  06/16/2012   Name:  Cody Velez   DOB:  May 25, 1946   MRN:  [id number]  PCP:  Carylon Perches, MD  Primary Cardiologist:  Dr. Valera Castle  Primary Electrophysiologist:  None    History of Present Illness: Cody Velez is a 66 y.o. male who returns for post hospital follow up.  He has a history of moderate, nonobstructive coronary artery disease.  Cardiac catheterization in 2006: Normal LM, 50-60% mLAD, D1 40% stenosis, pOM2 30%, pOM3 50%, dRCA 20%; LVEF 55-60%, 30% stenosis right renal artery.  Other history includes HTN, HL, sleep apnea, GERD, depression, DJD.  He was admitted 6/11-6/12 with complaints of chest pain radiating to his bilateral shoulders.  He ruled out for myocardial infarction but serial cardiac enzymes.  Echocardiogram 05/31/12: EF 55-60%, no wall motion abnormalities, mild LAE.  Patient's symptoms were felt to be atypical and he was discharged home with plans for outpatient stress testing.  Lexiscan Myoview 06/05/12: No ischemia, EF 58%.  Doing well since d/c.  The patient denies chest pain, shortness of breath, syncope, orthopnea, PND or significant pedal edema.  He has occasional lightheadedness.  This only lasts seconds.  No palpitations.      Wt Readings from Last 3 Encounters:  06/16/12 305 lb (138.347 kg)  06/05/12 300 lb (136.079 kg)  05/30/12 275 lb 9.2 oz (125 kg)     Potassium  Date/Time Value Range Status  05/31/2012  5:00 AM 3.7  3.5 - 5.1 mEq/L Final     Creatinine, Ser  Date/Time Value Range Status  05/31/2012  5:00 AM 1.06  0.50 - 1.35 mg/dL Final     TSH  Date/Time Value Range Status  05/30/2012  7:12 PM 1.030  0.350 - 4.500 uIU/mL Final     Hemoglobin  Date/Time Value Range Status  05/31/2012  5:00 AM 13.1  13.0 - 17.0 g/dL Final    Past Medical History  Diagnosis Date  . CAD in native artery     1.  cath 2006 normal left  main, 50-60% mid LAD stenosis, D1 40% stenosis, OM2 30% proximal stenosis, OM3 50% proximal stenosis, RCA distal 20% lesion; LVEF 55-60%. 30% stenosis right renal artery;  2. Echocardiogram 05/31/12: EF 55-60%, no wall motion abnormalities, mild LAE.;   3.   Lexiscan Myoview 06/05/12: No ischemia, EF 58%  . Rectal bleeding   . Hyperlipidemia   . Allergic rhinitis   . Hypertension     FOLLOWED BY DR TOM WALL.  . Depression   . OSA (obstructive sleep apnea)     CPAP; followed by dr Shelle Iron  . Asthma     "when I was a baby"  . Pneumonia 02/2011    "first time ever"  . GERD (gastroesophageal reflux disease)   . Arthritis     "all over"  . Tobacco abuse     smokeless tobacco  . Chronic back pain     Current Outpatient Prescriptions  Medication Sig Dispense Refill  . aspirin 81 MG EC tablet Take 81 mg by mouth at bedtime.       . chlorthalidone (HYGROTON) 25 MG tablet Take 25 mg by mouth at bedtime.       Marland Kitchen co-enzyme Q-10 50 MG capsule Take 50 mg by mouth daily.      Marland Kitchen docusate sodium (COLACE) 100 MG capsule Take 100 mg by mouth daily.      Marland Kitchen  esomeprazole (NEXIUM) 40 MG capsule Take 40 mg by mouth daily before breakfast.       . FLUoxetine (PROZAC) 40 MG capsule Take 40 mg by mouth daily.      . fluticasone (FLONASE) 50 MCG/ACT nasal spray Place 2 sprays into the nose daily.       Marland Kitchen loratadine (CLARITIN) 10 MG tablet Take 10 mg by mouth daily.      Marland Kitchen losartan (COZAAR) 50 MG tablet Take 50 mg by mouth daily.      . methocarbamol (ROBAXIN) 750 MG tablet Take 750 mg by mouth 3 (three) times daily as needed. For spasms      . Multiple Vitamin (MULITIVITAMIN WITH MINERALS) TABS Take 1 tablet by mouth daily.      . nitroGLYCERIN (NITROSTAT) 0.4 MG SL tablet Place 1 tablet (0.4 mg total) under the tongue every 5 (five) minutes as needed for chest pain (up to 3 doses).  25 tablet  3  . oxyCODONE-acetaminophen (PERCOCET) 10-325 MG per tablet Take 1 tablet by mouth 3 (three) times daily as needed. For  pain      . potassium chloride (KLOR-CON) 20 MEQ packet Take 20 mEq by mouth 2 (two) times daily.       . rosuvastatin (CRESTOR) 5 MG tablet Take 5 mg by mouth at bedtime.         Allergies: No Known Allergies  History  Substance Use Topics  . Smoking status: Never Smoker   . Smokeless tobacco: Current User    Types: Chew  . Alcohol Use: 0.0 oz/week    0 Glasses of wine per week     Seldom      PHYSICAL EXAM: VS:  BP 120/80  Pulse 70  Ht 6\' 3"  (1.905 m)  Wt 305 lb (138.347 kg)  BMI 38.12 kg/m2 Well nourished, well developed, in no acute distress HEENT: normal Neck: no JVD Vascular: no carotid bruits Cardiac:  normal S1, S2; RRR; no murmur Lungs:  clear to auscultation bilaterally, no wheezing, rhonchi or rales Abd: soft, nontender, no hepatomegaly Ext: trace bilateral LE edema Skin: warm and dry Neuro:  CNs 2-12 intact, no focal abnormalities noted  EKG:  NSR, HR 70, LAD, no acute changes   ASSESSMENT AND PLAN:  1.  Chest Pain No recurrence. Non-cardiac.  No further cardiac w/u.  2.  Coronary Artery Disease Continue risk factor modification. Remain on ASA.    3.  Hypertension Controlled. He does experience lightheadedness at times.  I have asked him to keep a check on his BPs and let us know if running low. If running low, he can decrease Losartan to 25 mg QD.  4.  Hyperlipidemia Managed by PCP.  He has been intolerant to statins.  5.  Obesity We discussed different strategies for weight loss. He is looking into a Lap Band type procedure.   Signed, Tereso Newcomer, PA-C  2:06 PM 06/16/2012

## 2012-07-07 ENCOUNTER — Ambulatory Visit (HOSPITAL_COMMUNITY): Payer: Federal, State, Local not specified - PPO

## 2012-09-07 ENCOUNTER — Ambulatory Visit: Payer: Federal, State, Local not specified - PPO | Admitting: Pulmonary Disease

## 2012-10-11 ENCOUNTER — Other Ambulatory Visit: Payer: Self-pay | Admitting: Neurological Surgery

## 2012-10-11 DIAGNOSIS — IMO0002 Reserved for concepts with insufficient information to code with codable children: Secondary | ICD-10-CM

## 2012-10-11 DIAGNOSIS — M47817 Spondylosis without myelopathy or radiculopathy, lumbosacral region: Secondary | ICD-10-CM

## 2012-10-12 ENCOUNTER — Ambulatory Visit
Admission: RE | Admit: 2012-10-12 | Discharge: 2012-10-12 | Disposition: A | Discharge status: Home / Self Care | Payer: Federal, State, Local not specified - PPO | Source: Ambulatory Visit | Attending: Neurological Surgery | Admitting: Neurological Surgery

## 2012-10-12 DIAGNOSIS — M47817 Spondylosis without myelopathy or radiculopathy, lumbosacral region: Secondary | ICD-10-CM

## 2012-10-12 DIAGNOSIS — IMO0002 Reserved for concepts with insufficient information to code with codable children: Secondary | ICD-10-CM

## 2013-05-15 ENCOUNTER — Other Ambulatory Visit (HOSPITAL_COMMUNITY): Payer: Self-pay | Admitting: Internal Medicine

## 2013-05-15 DIAGNOSIS — R2689 Other abnormalities of gait and mobility: Secondary | ICD-10-CM

## 2013-05-17 ENCOUNTER — Ambulatory Visit (HOSPITAL_COMMUNITY): Admission: RE | Admit: 2013-05-17 | Payer: Federal, State, Local not specified - PPO | Source: Ambulatory Visit

## 2013-05-25 ENCOUNTER — Encounter (HOSPITAL_COMMUNITY): Payer: Self-pay

## 2013-05-25 ENCOUNTER — Ambulatory Visit (HOSPITAL_COMMUNITY)
Admission: RE | Admit: 2013-05-25 | Discharge: 2013-05-25 | Disposition: A | Discharge status: Home / Self Care | Payer: Federal, State, Local not specified - PPO | Source: Ambulatory Visit | Attending: Internal Medicine | Admitting: Internal Medicine

## 2013-05-25 DIAGNOSIS — R269 Unspecified abnormalities of gait and mobility: Secondary | ICD-10-CM | POA: Insufficient documentation

## 2013-05-25 DIAGNOSIS — R259 Unspecified abnormal involuntary movements: Secondary | ICD-10-CM | POA: Insufficient documentation

## 2013-05-25 DIAGNOSIS — R2689 Other abnormalities of gait and mobility: Secondary | ICD-10-CM

## 2013-05-25 DIAGNOSIS — R51 Headache: Secondary | ICD-10-CM | POA: Insufficient documentation

## 2013-06-11 ENCOUNTER — Ambulatory Visit (HOSPITAL_COMMUNITY)
Admission: RE | Admit: 2013-06-11 | Discharge: 2013-06-11 | Disposition: A | Discharge status: Home / Self Care | Payer: Federal, State, Local not specified - PPO | Source: Ambulatory Visit | Attending: Internal Medicine | Admitting: Internal Medicine

## 2013-06-11 DIAGNOSIS — K7689 Other specified diseases of liver: Secondary | ICD-10-CM | POA: Insufficient documentation

## 2013-06-11 DIAGNOSIS — R109 Unspecified abdominal pain: Secondary | ICD-10-CM

## 2013-06-11 DIAGNOSIS — N400 Enlarged prostate without lower urinary tract symptoms: Secondary | ICD-10-CM | POA: Insufficient documentation

## 2013-10-22 ENCOUNTER — Encounter: Payer: Self-pay | Admitting: Internal Medicine

## 2013-12-03 ENCOUNTER — Ambulatory Visit (INDEPENDENT_AMBULATORY_CARE_PROVIDER_SITE_OTHER): Payer: Federal, State, Local not specified - PPO | Admitting: Gastroenterology

## 2013-12-03 ENCOUNTER — Encounter (INDEPENDENT_AMBULATORY_CARE_PROVIDER_SITE_OTHER): Payer: Self-pay

## 2013-12-03 VITALS — BP 154/95 | HR 72 | Temp 98.0°F | Ht 76.0 in | Wt 307.6 lb

## 2013-12-03 DIAGNOSIS — Z860100 Personal history of colon polyps, unspecified: Secondary | ICD-10-CM | POA: Insufficient documentation

## 2013-12-03 MED ORDER — LINACLOTIDE 145 MCG PO CAPS: 145.0000 ug | ORAL_CAPSULE | Freq: Every day | ORAL | Status: DC

## 2013-12-03 MED ORDER — PEG 3350-KCL-NA BICARB-NACL 420 G PO SOLR: 4000.0000 mL | ORAL | Status: DC

## 2013-12-03 NOTE — Progress Notes (Signed)
Primary Care Physician:  Carylon Perches, MD Primary Gastroenterologist:  Dr. Jena Gauss   Chief Complaint  Patient presents with  . Follow-up    HPI:   Cody Velez presents today for surveillance colonoscopy. Mother with history of colon cancer. Last colonoscopy in 2009 with tubular adenoma. No rectal bleeding. Takes Percocet so bowel habits are irregular. Constipated. Takes Miralax when he remembers, which helps some. Noted some lower abdominal pain recently, vague, every once in awhile. Nexium for GERD. Some regurgitation of food occasionally. Was drinking wine laying down and had it spill out of his mouth. Doesn't feel like he needs dilation. Last dilation in 2009.   Past Medical History  Diagnosis Date  . CAD in native artery     1.  cath 2006 normal left main, 50-60% mid LAD stenosis, D1 40% stenosis, OM2 30% proximal stenosis, OM3 50% proximal stenosis, RCA distal 20% lesion; LVEF 55-60%. 30% stenosis right renal artery;  2. Echocardiogram 05/31/12: EF 55-60%, no wall motion abnormalities, mild LAE.;   3.   Lexiscan Myoview 06/05/12: No ischemia, EF 58%  . Rectal bleeding   . Hyperlipidemia   . Allergic rhinitis   . Hypertension     FOLLOWED BY DR TOM WALL.  . Depression   . OSA (obstructive sleep apnea)     CPAP; followed by dr Shelle Iron  . Asthma     "when I was a baby"  . Pneumonia 02/2011    "first time ever"  . GERD (gastroesophageal reflux disease)   . Arthritis     "all over"  . Tobacco abuse     smokeless tobacco  . Chronic back pain   . PTSD (post-traumatic stress disorder)     [place]    Past Surgical History  Procedure Laterality Date  . Bilateral knee arthroscopy    . Facial cosmetic surgery  ` 1985    "broke face playing softball"  . Back surgery  02/14/12    lumbar OR #7; "today redid L1L2; replaced screws; added bone from hip"  . Fracture surgery      "left wrist; broke it; took spur off"  . Cardiac catheterization    . Esophagogastroduodenoscopy   10/15/2008      Dr Rourk:Schatzki's ring status post dilation and disruption via 39 F Maloney dilator/ otherwise unremarkable esophagus, small hiatal hernia, multiple fundal gland polyps not manipulated, gastritis, negative H.pylori  . Colonoscopy  10/15/2008    Dr. Jena Gauss: tubular adenoma   . Colonoscopy  12/17/2003    ZOX:WRUEAV rectal and colon  . Esophagogastroduodenoscopy  06/21/02    WUJ:WJXBJ sliding hiatal hernia with mild changes of reflux esophagitis limited to gastroesophageal junction.  Noncritical ring at distal esophagus, 3 cm proximal to gastroesophageal junction/Antral gastritis    Current Outpatient Prescriptions  Medication Sig Dispense Refill  . aspirin 81 MG EC tablet Take 81 mg by mouth at bedtime.       Marland Kitchen buPROPion (WELLBUTRIN SR) 200 MG 12 hr tablet Take 200 mg by mouth every morning.       . cloNIDine (CATAPRES) 0.2 MG tablet Take 0.2 mg by mouth at bedtime.      . diclofenac (VOLTAREN) 75 MG EC tablet Take 75 mg by mouth daily.       Marland Kitchen esomeprazole (NEXIUM) 40 MG capsule Take 40 mg by mouth daily before breakfast.       . etodolac (LODINE) 500 MG tablet Take 500 mg by mouth daily.       . fluticasone (  FLONASE) 50 MCG/ACT nasal spray Place 2 sprays into the nose daily.       Marland Kitchen losartan (COZAAR) 100 MG tablet Take 100 mg by mouth every morning.       Marland Kitchen oxyCODONE-acetaminophen (PERCOCET) 10-325 MG per tablet Take 1 tablet by mouth 3 (three) times daily as needed. For pain      . venlafaxine XR (EFFEXOR-XR) 150 MG 24 hr capsule Take 150 mg by mouth every evening.       . Linaclotide (LINZESS) 145 MCG CAPS capsule Take 1 capsule (145 mcg total) by mouth daily. 30 minutes before breakfast.  30 capsule  5  . loratadine (CLARITIN) 10 MG tablet Take 10 mg by mouth daily.      Marland Kitchen losartan (COZAAR) 50 MG tablet Take 50 mg by mouth daily.      . methocarbamol (ROBAXIN) 500 MG tablet Take 500 mg by mouth every 6 (six) hours as needed. For muscle spasms (Patient takes with Percocet)       . Misc Natural Products (OSTEO BI-FLEX ADV TRIPLE ST PO) Take 2 tablets by mouth at bedtime.      . nitroGLYCERIN (NITROSTAT) 0.4 MG SL tablet Place 1 tablet (0.4 mg total) under the tongue every 5 (five) minutes as needed for chest pain (up to 3 doses).  25 tablet  3  . polyethylene glycol-electrolytes (TRILYTE) 420 G solution Take 4,000 mLs by mouth as directed.  4000 mL  0  . pravastatin (PRAVACHOL) 10 MG tablet Take 10 mg by mouth daily.      . Pseudoephed-APAP-Guaifenesin (TYLENOL SINUS SEVERE CONGEST) 30-325-200 MG TABS Take 2 tablets by mouth daily as needed.      . tadalafil (CIALIS) 5 MG tablet Take 5 mg by mouth every morning.       No current facility-administered medications for this visit.    Allergies as of 12/03/2013  . (No Known Allergies)    Family History  Problem Relation Age of Onset  . Emphysema Father   . Heart failure Father   . Lung cancer Father   . Emphysema Maternal Grandmother   . Stroke Maternal Grandmother   . Asthma Other     grandson  . Heart disease Paternal Grandfather   . Colon cancer Mother   . Stroke Mother   . Stroke Sister   . Anesthesia problems Neg Hx   . Hypotension Neg Hx   . Malignant hyperthermia Neg Hx   . Pseudochol deficiency Neg Hx     History   Social History  . Marital Status: Married    Spouse Name: N/A    Number of Children: 3  . Years of Education: N/A   Occupational History  . [employer]    Social History Main Topics  . Smoking status: Never Smoker   . Smokeless tobacco: Current User    Types: Chew     Comment: Quit x 50 years  . Alcohol Use: 0.0 oz/week    0 Glasses of wine per week     Comment: Seldom  . Drug Use: Yes    Special: Marijuana     Comment: "last used marijuana ~ 1969"  . Sexual Activity: No   Other Topics Concern  . 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    Review of Systems: Gen: Denies any fever, chills, fatigue,  weight loss, lack of appetite.  CV: Denies chest pain, heart palpitations, peripheral edema,  syncope.  Resp: +DOE GI: see HPI GU : Denies urinary burning, urinary frequency, urinary hesitancy MS: +joint pain Derm: Denies rash, itching, dry skin Psych: Denies depression, anxiety, memory loss, and confusion Heme: Denies bruising, bleeding, and enlarged lymph nodes.  Physical Exam: BP 154/95  Pulse 72  Temp(Src) 98 F (36.7 C) (Oral)  Ht 6\' 4"  (1.93 m)  Wt 307 lb 9.6 oz (139.526 kg)  BMI 37.46 kg/m2 General:   Alert and oriented. Pleasant and cooperative. Well-nourished and well-developed.  Head:  Normocephalic and atraumatic. Eyes:  Without icterus, sclera clear and conjunctiva pink.  Ears:  Normal auditory acuity. Nose:  No deformity, discharge,  or lesions. Mouth:  No deformity or lesions, oral mucosa pink.  Neck:  Supple, without mass or thyromegaly. Lungs:  Clear to auscultation bilaterally. No wheezes, rales, or rhonchi. No distress.  Heart:  S1, S2 present without murmurs appreciated.  Abdomen:  +BS, soft, non-tender and non-distended. No HSM noted. No guarding or rebound. No masses appreciated.  Rectal:  Deferred  Msk:  Symmetrical without gross deformities. Normal posture. Extremities:  Without clubbing or edema. Neurologic:  Alert and  oriented x4;  grossly normal neurologically. Skin:  Intact without significant lesions or rashes. Cervical Nodes:  No significant cervical adenopathy. Psych:  Alert and cooperative. Normal mood and affect.

## 2013-12-03 NOTE — Patient Instructions (Signed)
For constipation: start taking Linzess 1 capsule each morning on an empty stomach. I have provided a voucher for a month free and sent refills to your pharmacy.  You have been scheduled for a routine colonoscopy with Dr. Jena Gauss in the near future.   Have a Cody Velez Christmas!

## 2013-12-04 ENCOUNTER — Other Ambulatory Visit: Payer: Self-pay

## 2013-12-04 ENCOUNTER — Encounter (HOSPITAL_COMMUNITY): Payer: Self-pay | Admitting: Emergency Medicine

## 2013-12-04 ENCOUNTER — Emergency Department (HOSPITAL_COMMUNITY)
Admission: EM | Admit: 2013-12-04 | Discharge: 2013-12-04 | Disposition: A | Discharge status: Home / Self Care | Payer: Federal, State, Local not specified - PPO | Attending: Emergency Medicine | Admitting: Emergency Medicine

## 2013-12-04 ENCOUNTER — Emergency Department (HOSPITAL_COMMUNITY): Payer: Federal, State, Local not specified - PPO

## 2013-12-04 ENCOUNTER — Encounter: Payer: Self-pay | Admitting: Gastroenterology

## 2013-12-04 DIAGNOSIS — F4310 Post-traumatic stress disorder, unspecified: Secondary | ICD-10-CM | POA: Insufficient documentation

## 2013-12-04 DIAGNOSIS — F3289 Other specified depressive episodes: Secondary | ICD-10-CM | POA: Insufficient documentation

## 2013-12-04 DIAGNOSIS — M129 Arthropathy, unspecified: Secondary | ICD-10-CM | POA: Insufficient documentation

## 2013-12-04 DIAGNOSIS — J45901 Unspecified asthma with (acute) exacerbation: Secondary | ICD-10-CM | POA: Insufficient documentation

## 2013-12-04 DIAGNOSIS — IMO0002 Reserved for concepts with insufficient information to code with codable children: Secondary | ICD-10-CM | POA: Insufficient documentation

## 2013-12-04 DIAGNOSIS — R079 Chest pain, unspecified: Secondary | ICD-10-CM | POA: Insufficient documentation

## 2013-12-04 DIAGNOSIS — G8929 Other chronic pain: Secondary | ICD-10-CM | POA: Insufficient documentation

## 2013-12-04 DIAGNOSIS — Z8639 Personal history of other endocrine, nutritional and metabolic disease: Secondary | ICD-10-CM | POA: Insufficient documentation

## 2013-12-04 DIAGNOSIS — Z791 Long term (current) use of non-steroidal anti-inflammatories (NSAID): Secondary | ICD-10-CM | POA: Insufficient documentation

## 2013-12-04 DIAGNOSIS — Z95818 Presence of other cardiac implants and grafts: Secondary | ICD-10-CM | POA: Insufficient documentation

## 2013-12-04 DIAGNOSIS — M25519 Pain in unspecified shoulder: Secondary | ICD-10-CM | POA: Insufficient documentation

## 2013-12-04 DIAGNOSIS — Z862 Personal history of diseases of the blood and blood-forming organs and certain disorders involving the immune mechanism: Secondary | ICD-10-CM | POA: Insufficient documentation

## 2013-12-04 DIAGNOSIS — I10 Essential (primary) hypertension: Secondary | ICD-10-CM | POA: Insufficient documentation

## 2013-12-04 DIAGNOSIS — K219 Gastro-esophageal reflux disease without esophagitis: Secondary | ICD-10-CM | POA: Insufficient documentation

## 2013-12-04 DIAGNOSIS — Z7982 Long term (current) use of aspirin: Secondary | ICD-10-CM | POA: Insufficient documentation

## 2013-12-04 DIAGNOSIS — F329 Major depressive disorder, single episode, unspecified: Secondary | ICD-10-CM | POA: Insufficient documentation

## 2013-12-04 DIAGNOSIS — R635 Abnormal weight gain: Secondary | ICD-10-CM | POA: Insufficient documentation

## 2013-12-04 DIAGNOSIS — Z8701 Personal history of pneumonia (recurrent): Secondary | ICD-10-CM | POA: Insufficient documentation

## 2013-12-04 DIAGNOSIS — R609 Edema, unspecified: Secondary | ICD-10-CM | POA: Insufficient documentation

## 2013-12-04 DIAGNOSIS — G4733 Obstructive sleep apnea (adult) (pediatric): Secondary | ICD-10-CM | POA: Insufficient documentation

## 2013-12-04 DIAGNOSIS — Z79899 Other long term (current) drug therapy: Secondary | ICD-10-CM | POA: Insufficient documentation

## 2013-12-04 DIAGNOSIS — I2510 Atherosclerotic heart disease of native coronary artery without angina pectoris: Secondary | ICD-10-CM | POA: Insufficient documentation

## 2013-12-04 LAB — COMPREHENSIVE METABOLIC PANEL
ALT: 19 U/L (ref 0–53)
AST: 19 U/L (ref 0–37)
Albumin: 3.3 g/dL — ABNORMAL LOW (ref 3.5–5.2)
Alkaline Phosphatase: 95 U/L (ref 39–117)
BUN: 9 mg/dL (ref 6–23)
CO2: 26 mEq/L (ref 19–32)
Calcium: 8.8 mg/dL (ref 8.4–10.5)
Chloride: 104 mEq/L (ref 96–112)
Creatinine, Ser: 1.05 mg/dL (ref 0.50–1.35)
GFR calc Af Amer: 83 mL/min — ABNORMAL LOW (ref >90)
GFR calc non Af Amer: 71 mL/min — ABNORMAL LOW (ref >90)
Glucose, Bld: 125 mg/dL — ABNORMAL HIGH (ref 70–99)
Potassium: 3.1 mEq/L — ABNORMAL LOW (ref 3.5–5.1)
Sodium: 139 mEq/L (ref 135–145)
Total Bilirubin: 0.2 mg/dL — ABNORMAL LOW (ref 0.3–1.2)
Total Protein: 7 g/dL (ref 6.0–8.3)

## 2013-12-04 LAB — CBC
HCT: 40.6 % (ref 39.0–52.0)
Hemoglobin: 13.1 g/dL (ref 13.0–17.0)
MCH: 28.5 pg (ref 26.0–34.0)
MCHC: 32.3 g/dL (ref 30.0–36.0)
MCV: 88.5 fL (ref 78.0–100.0)
Platelets: 300 10*3/uL (ref 150–400)
RBC: 4.59 MIL/uL (ref 4.22–5.81)
RDW: 14.4 % (ref 11.5–15.5)
WBC: 6.5 10*3/uL (ref 4.0–10.5)

## 2013-12-04 LAB — TROPONIN I
Troponin I: <0.3 ng/mL (ref <0.30)
Troponin I: <0.3 ng/mL (ref <0.30)

## 2013-12-04 LAB — PRO B NATRIURETIC PEPTIDE: Pro B Natriuretic peptide (BNP): 27.5 pg/mL (ref 0–125)

## 2013-12-04 MED ORDER — ASPIRIN 81 MG PO CHEW
324.0000 mg | CHEWABLE_TABLET | Freq: Once | ORAL | Status: AC
  Administered 2013-12-04: 324 mg via ORAL
  Filled 2013-12-04: qty 4

## 2013-12-04 NOTE — Assessment & Plan Note (Signed)
67 year old male with need for surveillance colonoscopy; no concerning lower GI symptoms. Also notable family history of colon cancer in mother. Last colonoscopy 2009.   Proceed with TCS with Dr. Jena Gauss in near future: the risks, benefits, and alternatives have been discussed with the patient in detail. The patient states understanding and desires to proceed.

## 2013-12-04 NOTE — ED Notes (Signed)
Pt states that his chest pain is 0 at present. Pt states that the nausea this afternoon with chest pain about one hour ago.

## 2013-12-04 NOTE — ED Notes (Signed)
Pt c/o cp radiating to left shoulder and arm with dizziness/n.

## 2013-12-04 NOTE — ED Notes (Signed)
Pt alert & oriented x4, stable gait. Patient  given discharge instructions, paperwork & prescription(s). Patient verbalized understanding. Pt left department w/ no further questions. 

## 2013-12-04 NOTE — ED Provider Notes (Signed)
CSN: [id number]     Arrival date & time 12/04/13  1601 History  This chart was scribed for Gerhard Munch, MD by Luisa Dago, ED Scribe. This patient was seen in room APA14/APA14 and the patient's care was started at 4:19 PM.   Chief Complaint  Patient presents with  . Chest Pain    The history is provided by the patient. No language interpreter was used.   HPI Comments: Cody Velez is a 67 y.o. male who presents to the Emergency Department complaining of chest pain that radiates to his left shoulder and down his arm that started 1.5 hours ago. Pt states that he was sitting down when he experienced a sudden sharp pain in his left shoulder. He states that the pain is not exacerbated by movement.  Pt reports associated chronic SOB that has increased since the onset of the Chest pain and reports a noticeable weight gain over the past few months. His last cardiac cath was in 2006, last  Echo cardiogram was in 2013. He states that he has a colonoscopy scheduled for next month. Pt reports no personal history of MI and CHF but has a family history of these diseases.  Pt denies fever, nausea, and emesis.   Past Medical History  Diagnosis Date  . CAD in native artery     1.  cath 2006 normal left main, 50-60% mid LAD stenosis, D1 40% stenosis, OM2 30% proximal stenosis, OM3 50% proximal stenosis, RCA distal 20% lesion; LVEF 55-60%. 30% stenosis right renal artery;  2. Echocardiogram 05/31/12: EF 55-60%, no wall motion abnormalities, mild LAE.;   3.   Lexiscan Myoview 06/05/12: No ischemia, EF 58%  . Rectal bleeding   . Hyperlipidemia   . Allergic rhinitis   . Hypertension     FOLLOWED BY DR TOM WALL.  . Depression   . OSA (obstructive sleep apnea)     CPAP; followed by dr Shelle Iron  . Asthma     "when I was a baby"  . Pneumonia 02/2011    "first time ever"  . GERD (gastroesophageal reflux disease)   . Arthritis     "all over"  . Tobacco abuse     smokeless tobacco  . Chronic back pain    . PTSD (post-traumatic stress disorder)     [place]   Past Surgical History  Procedure Laterality Date  . Bilateral knee arthroscopy    . Facial cosmetic surgery  ` 1985    "broke face playing softball"  . Back surgery  02/14/12    lumbar OR #7; "today redid L1L2; replaced screws; added bone from hip"  . Fracture surgery      "left wrist; broke it; took spur off"  . Cardiac catheterization    . Esophagogastroduodenoscopy  10/15/2008      WUJ:[id number] ring status post dilation and disruption as above/ otherwise unremarkable esophagus, small hiatal hernia  . Colonoscopy  10/15/2008    RMR: Multiple fundal gland polyps not manipulated, diffuse gastric submucosal petechiae query gastritis status post biopsy, otherwise stomach appeared normal, patent pylorus, normal D1 and D2, colonoscopy  . Colonoscopy  12/17/2003    [id number] rectal and colon  . Esophagogastroduodenoscopy  06/21/02    VHQ:IONGE sliding hiatal hernia with mild changes of reflux esophagitis limited to gastroesophageal junction.  Noncritical ring at distal esophagus, 3 cm proximal to gastroesophageal junction/Antral gastritis   Family History  Problem Relation Age of Onset  . Emphysema Father   . Heart failure Father   .  Lung cancer Father   . Emphysema Maternal Grandmother   . Stroke Maternal Grandmother   . Asthma Other     grandson  . Heart disease Paternal Grandfather   . Colon cancer Mother   . Stroke Mother   . Stroke Sister   . Anesthesia problems Neg Hx   . Hypotension Neg Hx   . Malignant hyperthermia Neg Hx   . Pseudochol deficiency Neg Hx    History  Substance Use Topics  . Smoking status: Never Smoker   . Smokeless tobacco: Current User    Types: Chew     Comment: Quit x 50 years  . Alcohol Use: 0.0 oz/week    0 Glasses of wine per week     Comment: Seldom    Review of Systems  Respiratory: Positive for shortness of breath.   Cardiovascular: Positive for chest pain. Negative for leg  swelling.  Gastrointestinal: Negative for vomiting.  Musculoskeletal: Positive for arthralgias.  All other systems reviewed and are negative.    Allergies  Review of patient's allergies indicates no known allergies.  Home Medications   Current Outpatient Rx  Name  Route  Sig  Dispense  Refill  . aspirin 81 MG EC tablet   Oral   Take 81 mg by mouth at bedtime.          Marland Kitchen buPROPion (WELLBUTRIN SR) 200 MG 12 hr tablet   Oral   Take 200 mg by mouth 2 (two) times daily.         . cloNIDine (CATAPRES) 0.2 MG tablet   Oral   Take 0.2 mg by mouth at bedtime.         . diclofenac (VOLTAREN) 75 MG EC tablet   Oral   Take 75 mg by mouth 2 (two) times daily.         Marland Kitchen esomeprazole (NEXIUM) 40 MG capsule   Oral   Take 40 mg by mouth daily before breakfast.          . etodolac (LODINE) 500 MG tablet   Oral   Take 500 mg by mouth 2 (two) times daily.         . fluticasone (FLONASE) 50 MCG/ACT nasal spray   Nasal   Place 2 sprays into the nose daily.          Marland Kitchen Linaclotide (LINZESS) 145 MCG CAPS capsule   Oral   Take 1 capsule (145 mcg total) by mouth daily. 30 minutes before breakfast.   30 capsule   5   . loratadine (CLARITIN) 10 MG tablet   Oral   Take 10 mg by mouth daily.         Marland Kitchen losartan (COZAAR) 100 MG tablet   Oral   Take 100 mg by mouth daily.         Marland Kitchen EXPIRED: losartan (COZAAR) 50 MG tablet   Oral   Take 50 mg by mouth daily.         Marland Kitchen EXPIRED: nitroGLYCERIN (NITROSTAT) 0.4 MG SL tablet   Sublingual   Place 1 tablet (0.4 mg total) under the tongue every 5 (five) minutes as needed for chest pain (up to 3 doses).   25 tablet   3   . NON FORMULARY      Osteo Bi Flex   One tablet daily         . oxyCODONE-acetaminophen (PERCOCET) 10-325 MG per tablet   Oral   Take 1 tablet by mouth 3 (three) times  daily as needed. For pain         . polyethylene glycol-electrolytes (TRILYTE) 420 G solution   Oral   Take 4,000 mLs by mouth as  directed.   4000 mL   0   . venlafaxine XR (EFFEXOR-XR) 150 MG 24 hr capsule   Oral   Take 150 mg by mouth 2 (two) times daily.          BP 145/89  Pulse 85  Resp 20  SpO2 98%  Physical Exam  Nursing note and vitals reviewed. Constitutional: He is oriented to person, place, and time. He appears well-developed. No distress.  HENT:  Head: Normocephalic and atraumatic.  Eyes: Conjunctivae and EOM are normal.  Cardiovascular: Normal rate, regular rhythm and normal heart sounds.   Pulmonary/Chest: Effort normal. No stridor. No respiratory distress.  Abdominal: He exhibits no distension.  Musculoskeletal: He exhibits edema (mild).  Mild lower extremity edema.  Neurological: He is alert and oriented to person, place, and time.  Skin: Skin is warm and dry.  Psychiatric: He has a normal mood and affect.    ED Course  Procedures (including critical care time)  DIAGNOSTIC STUDIES: Oxygen Saturation is 98% on room air, normal by my interpretation.    COORDINATION OF CARE: 4:06 PM-Discussed treatment plan which includes CXR, ASA, and CBC  with pt at bedside and pt agreed to plan.   6:27 PM- Pt complained of jaw pain but said the pain has gone away. Discussed with pt of conducting more blood work and going through his prescribed medicines to aid with diagnosis.  8:13 PM- Discussed taking his triponin again. Had le   Labs Review Labs Reviewed - No data to display Imaging Review No results found.  EKG Interpretation   None      EKG has sinus rhythm, rate 89 no ischemic changes, no significant changes from prior. 9:29 PM Patient appears calm.  He is eating [employer]. MDM  No diagnosis found.  I personally performed the services described in this documentation, which was scribed in my presence. The recorded information has been reviewed and is accurate.   This patient presents after an episode of chest pain.  Notably, the pain was most prominent about the left anterior  superior shoulder.  On my exam the patient has had no significant ongoing complaints.  Throughout his monitoring course in emergency Department and no evidence of distress, nor imminent decompensation.  Patient's labs, including serial troponins were reassuring.  He has a reassuring EKG, chest x-ray. I had multiple conversations with him in multiple family members about his history, the need for outpatient followup, medication reconciliation 2 to his multiple medications. Patient's evaluation overall there is a reassuring, but with his age, comorbidities, he will be following up expeditiously as an outpatient.   Gerhard Munch, MD 12/04/13 2131

## 2013-12-05 NOTE — Progress Notes (Signed)
cc'd to pcp 

## 2013-12-19 ENCOUNTER — Encounter (HOSPITAL_COMMUNITY): Payer: Self-pay | Admitting: Pharmacy Technician

## 2013-12-25 ENCOUNTER — Other Ambulatory Visit: Payer: Self-pay | Admitting: Cardiology

## 2013-12-25 ENCOUNTER — Ambulatory Visit (INDEPENDENT_AMBULATORY_CARE_PROVIDER_SITE_OTHER): Payer: Federal, State, Local not specified - PPO | Admitting: Cardiology

## 2013-12-25 ENCOUNTER — Encounter: Payer: Self-pay | Admitting: Cardiology

## 2013-12-25 ENCOUNTER — Encounter: Payer: Self-pay | Admitting: *Deleted

## 2013-12-25 ENCOUNTER — Telehealth: Payer: Self-pay | Admitting: Internal Medicine

## 2013-12-25 VITALS — BP 170/90 | HR 85 | Ht 75.0 in | Wt 309.0 lb

## 2013-12-25 DIAGNOSIS — I2510 Atherosclerotic heart disease of native coronary artery without angina pectoris: Secondary | ICD-10-CM

## 2013-12-25 DIAGNOSIS — E782 Mixed hyperlipidemia: Secondary | ICD-10-CM

## 2013-12-25 DIAGNOSIS — I10 Essential (primary) hypertension: Secondary | ICD-10-CM

## 2013-12-25 DIAGNOSIS — I200 Unstable angina: Secondary | ICD-10-CM

## 2013-12-25 MED ORDER — CLONIDINE HCL 0.2 MG PO TABS: 0.2000 mg | ORAL_TABLET | Freq: Two times a day (BID) | ORAL | Status: DC

## 2013-12-25 MED ORDER — SODIUM CHLORIDE 0.9 % IV SOLN: INTRAVENOUS | Status: DC

## 2013-12-25 NOTE — Telephone Encounter (Signed)
He has been notified that he is to arrive at 10:30 and I advised not to take his percocet the morning of the procedure and he understands

## 2013-12-25 NOTE — Assessment & Plan Note (Signed)
Symptoms concerning for accelerating angina within the last few weeks on baseline history of CAD by cardiac catheterization in 2006. Blood pressure has also been more difficult to control. Recent ECG reviewed without acute ST segment changes. Cardiac markers negative with ER visit. We have discussed options for further ischemic workup including noninvasive and invasive techniques, and plan is to proceed with a diagnostic cardiac catheterization this week. Reviewed risks and benefits, and he is in agreement to proceed. He will continue on present medical regimen including aspirin, clonidine is being increased at least in the short-term. Followup lab work to be obtained.

## 2013-12-25 NOTE — Assessment & Plan Note (Signed)
On Pravachol, followed by Dr. Willey Blade.

## 2013-12-25 NOTE — Progress Notes (Signed)
Clinical Summary Cody Velez is a 68 y.o.male presenting for an office visit after ER visit in December 2014 with chest pain. He is a former patient of Dr. Verl Blalock, last seen by Cody Velez in June 2013. Cardiac history is noted below including most recent ischemic workup by Myoview in June 2013 that was negative for ischemia.   Lab work reviewed from his ER assessment, troponin I negative, proBNP 27.5, hemoglobin 13.1, creatinine 1.0. Chest x-ray reported no active cardiopulmonary disease. ECG reviewed showing sinus rhythm with left axis and R' V1-V2, no acute ST segment changes. He was seen by Dr. Vanita Panda and discharged home.  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 He stays busy with work, no regular exercise. He reports symptoms of pressure, fairly intense, occurred in his left arm and left upper chest, both times recently at rest. He has also had more difficulty controlling his blood pressure over the last month.  He reports compliance with his medications. Saw Dr. Willey Blade recently.   No Known Allergies  Current Outpatient Prescriptions  Medication Sig Dispense Refill  . aspirin 81 MG EC tablet Take 81 mg by mouth at bedtime.       Marland Kitchen buPROPion (WELLBUTRIN SR) 200 MG 12 hr tablet Take 200 mg by mouth daily.       . cloNIDine (CATAPRES) 0.2 MG tablet Take 0.2 mg by mouth at bedtime.      . diclofenac (VOLTAREN) 75 MG EC tablet Take 75 mg by mouth 2 (two) times daily.       Marland Kitchen esomeprazole (NEXIUM) 40 MG capsule Take 40 mg by mouth daily before breakfast.       . etodolac (LODINE) 500 MG tablet Take 500 mg by mouth 2 (two) times daily.      . fluticasone (FLONASE) 50 MCG/ACT nasal spray Place 2 sprays into the nose daily.       Marland Kitchen HYDROcodone-homatropine (HYCODAN) 5-1.5 MG/5ML syrup Take 5 mLs by mouth every 4 (four) hours as needed.       . Linaclotide (LINZESS) 145 MCG CAPS capsule Take 1 capsule (145 mcg total) by mouth daily.  30 minutes before breakfast.  30 capsule  5  . loratadine (CLARITIN) 10 MG tablet Take 10 mg by mouth daily.      Marland Kitchen losartan (COZAAR) 100 MG tablet Take 100 mg by mouth every morning.       . meloxicam (MOBIC) 15 MG tablet Take 15 mg by mouth daily.      Marland Kitchen menthol-cetylpyridinium (CEPACOL) 3 MG lozenge Take 1 lozenge by mouth as needed for sore throat.      . methocarbamol (ROBAXIN) 500 MG tablet Take 500 mg by mouth every 6 (six) hours as needed. For muscle spasms (Patient takes with Percocet)      . Misc Natural Products (OSTEO BI-FLEX ADV TRIPLE ST PO) Take 2 tablets by mouth at bedtime.      Marland Kitchen oxyCODONE-acetaminophen (PERCOCET) 10-325 MG per tablet Take 1 tablet by mouth 3 (three) times daily as needed. For pain      . pravastatin (PRAVACHOL) 10 MG tablet Take 10 mg by mouth daily.      . Pseudoephed-APAP-Guaifenesin (TYLENOL SINUS SEVERE CONGEST) 30-325-200 MG TABS Take 2 tablets by mouth daily as needed.      . tadalafil (CIALIS) 5 MG tablet Take 5 mg by mouth every morning.      . venlafaxine XR (EFFEXOR-XR) 150 MG  24 hr capsule Take 150 mg by mouth every evening.        No current facility-administered medications for this visit.    Past Medical History  Diagnosis Date  . Coronary atherosclerosis of native coronary artery     1.  cath 2006 normal left main, 50-60% mid LAD stenosis, D1 40% stenosis, OM2 30% proximal stenosis, OM3 50% proximal stenosis, RCA distal 20% lesion; LVEF 55-60%. 30% stenosis right renal artery;  2. Echocardiogram 05/31/12: EF 55-60%, no wall motion abnormalities, mild LAE.;   3.   Lexiscan Myoview 06/05/12: No ischemia, EF 58%  . Rectal bleeding   . Hyperlipidemia   . Allergic rhinitis   . Essential hypertension, benign   . Depression   . OSA (obstructive sleep apnea)     CPAP - Dr. [name]  . Asthma   . History of pneumonia 02/2011  . GERD (gastroesophageal reflux disease)   . Arthritis   . Chronic back pain   . PTSD (post-traumatic stress disorder)      [place]    Past Surgical History  Procedure Laterality Date  . Bilateral knee arthroscopy    . Facial cosmetic surgery  ` 1985    "broke face playing softball"  . Back surgery  02/14/12    lumbar OR #7; "today redid L1L2; replaced screws; added bone from hip"  . Fracture surgery      "left wrist; broke it; took spur off"  . Esophagogastroduodenoscopy  10/15/2008      Dr Rourk:Schatzki's ring status post dilation and disruption via 74 F Maloney dilator/ otherwise unremarkable esophagus, small hiatal hernia, multiple fundal gland polyps not manipulated, gastritis, negative H.pylori  . Colonoscopy  10/15/2008    Dr. [name]: tubular adenoma   . Colonoscopy  12/17/2003    HAL:PFXTKW rectal and colon  . Esophagogastroduodenoscopy  06/21/02    IOX:BDZHG sliding hiatal hernia with mild changes of reflux esophagitis limited to gastroesophageal junction.  Noncritical ring at distal esophagus, 3 cm proximal to gastroesophageal junction/Antral gastritis    Family History  Problem Relation Age of Onset  . Emphysema Father   . Heart failure Father   . Lung cancer Father   . Emphysema Maternal Grandmother   . Stroke Maternal Grandmother   . Asthma Other     grandson  . Heart disease Paternal Grandfather   . Colon cancer Mother   . Stroke Mother   . Stroke Sister   . Anesthesia problems Neg Hx   . Hypotension Neg Hx   . Malignant hyperthermia Neg Hx   . Pseudochol deficiency Neg Hx     Social History Cody Velez reports that he has never smoked. His smokeless tobacco use includes Chew. Cody Velez reports that he drinks alcohol.  Review of Systems Reports no palpitations, dizziness, or syncope.  Physical Examination Filed Vitals:   12/25/13 1643  BP: 170/90  Pulse: 85   Filed Weights   12/25/13 1609  Weight: 309 lb (140.161 kg)   Overweight male, appears comfortable at rest, no active symptoms reported. HEENT: Conjunctiva and lids normal, oropharynx clear. Neck: Supple, no  elevated JVP or carotid bruits, no thyromegaly. Lungs: Clear to auscultation, nonlabored breathing at rest. Cardiac: Regular rate and rhythm, S4 noted, soft systolic murmur, no pericardial rub. Abdomen: Soft, nontender, bowel sounds present, no guarding or rebound. Extremities: No pitting edema, distal pulses 2+. Skin: Warm and dry. Musculoskeletal: No kyphosis. Neuropsychiatric: Alert and oriented x3, affect grossly appropriate.   Problem List and Plan  Accelerating angina Symptoms concerning for accelerating angina within the last few weeks on baseline history of CAD by cardiac catheterization in 2006. Blood pressure has also been more difficult to control. Recent ECG reviewed without acute ST segment changes. Cardiac markers negative with ER visit. We have discussed options for further ischemic workup including noninvasive and invasive techniques, and plan is to proceed with a diagnostic cardiac catheterization this week. Reviewed risks and benefits, and he is in agreement to proceed. He will continue on present medical regimen including aspirin, clonidine is being increased at least in the short-term. Followup lab work to be obtained.  CAD, NATIVE VESSEL As noted above at cardiac catheterization 2006, overall mild to moderate disease. Concern is that he has manifested significant progression over time.  Essential hypertension, benign Reportedly more difficult to control over the last month. Clonidine being increased to twice daily, otherwise continue current regimen. Elevated blood pressure could be associated with progressive CAD as well.  HYPERCHOLESTEROLEMIA On Pravachol, followed by Dr. Willey Blade.    [name], M.D., F.A.C.C.

## 2013-12-25 NOTE — Assessment & Plan Note (Signed)
Reportedly more difficult to control over the last month. Clonidine being increased to twice daily, otherwise continue current regimen. Elevated blood pressure could be associated with progressive CAD as well.

## 2013-12-25 NOTE — Telephone Encounter (Signed)
Pt stopped by to pick up his instruction sheet. He said he had been [facility] for his tcs to 1/8 and he said that he was told to be there at 0830. I told him that on our schedule he is scheduled for 11am and needed to be there at 10am. He wants [place] to clarify the times and I told him that I would ask LAL, but Im sure it's 10am. Then he wanted to know if he could take his percocet for his back if it started acting up before his procedure. I told him that was a question for the nurse and I would have to get with her as well and someone would call him back to clarify his questions.

## 2013-12-25 NOTE — Assessment & Plan Note (Signed)
As noted above at cardiac catheterization 2006, overall mild to moderate disease. Concern is that he has manifested significant progression over time.

## 2013-12-25 NOTE — Patient Instructions (Signed)
   Increase Clonidine to twice a day - printed script given Continue all other medications.   Your physician has requested that you have a cardiac catheterization. Cardiac catheterization is used to diagnose and/or treat various heart conditions. Doctors may recommend this procedure for a number of different reasons. The most common reason is to evaluate chest pain. Chest pain can be a symptom of coronary artery disease (CAD), and cardiac catheterization can show whether plaque is narrowing or blocking your heart's arteries. This procedure is also used to evaluate the valves, as well as measure the blood flow and oxygen levels in different parts of your heart. For further information please visit [email or web address]. Please follow instruction sheet, as given. Follow up will be given at time of discharge from cath

## 2013-12-26 ENCOUNTER — Telehealth: Payer: Self-pay | Admitting: Gastroenterology

## 2013-12-26 ENCOUNTER — Telehealth: Payer: Self-pay | Admitting: Cardiology

## 2013-12-26 NOTE — Telephone Encounter (Signed)
Pt has [employer].  No precert required

## 2013-12-26 NOTE — Telephone Encounter (Signed)
Left heart cath - Thursday, 12/27/13 - 7:30 - Cody Velez

## 2013-12-26 NOTE — Telephone Encounter (Signed)
Agree 

## 2013-12-26 NOTE — Telephone Encounter (Signed)
Mr. Mancinas was seen in the office by AS on 12/15 and was scheduled for TCS on 01/08 w/RMR, he called this morning and stated that he needed to cancel his TCS due to having heart problems and will need a catheterization on 01/08 and he would need to call us back after he has gotten over this to R/S

## 2013-12-27 ENCOUNTER — Encounter (HOSPITAL_COMMUNITY)
Admission: RE | Disposition: A | Discharge status: Home / Self Care | Payer: Self-pay | Source: Ambulatory Visit | Attending: Cardiology

## 2013-12-27 ENCOUNTER — Ambulatory Visit (HOSPITAL_COMMUNITY)
Admission: RE | Admit: 2013-12-27 | Payer: Federal, State, Local not specified - PPO | Source: Ambulatory Visit | Admitting: Internal Medicine

## 2013-12-27 ENCOUNTER — Ambulatory Visit (HOSPITAL_COMMUNITY)
Admission: RE | Admit: 2013-12-27 | Discharge: 2013-12-27 | Disposition: A | Discharge status: Home / Self Care | Payer: Federal, State, Local not specified - PPO | Source: Ambulatory Visit | Attending: Cardiology | Admitting: Cardiology

## 2013-12-27 ENCOUNTER — Encounter (HOSPITAL_COMMUNITY): Admission: RE | Payer: Self-pay | Source: Ambulatory Visit

## 2013-12-27 ENCOUNTER — Telehealth: Payer: Self-pay | Admitting: Cardiology

## 2013-12-27 DIAGNOSIS — E663 Overweight: Secondary | ICD-10-CM | POA: Insufficient documentation

## 2013-12-27 DIAGNOSIS — E7800 Pure hypercholesterolemia, unspecified: Secondary | ICD-10-CM | POA: Insufficient documentation

## 2013-12-27 DIAGNOSIS — I10 Essential (primary) hypertension: Secondary | ICD-10-CM | POA: Diagnosis not present

## 2013-12-27 DIAGNOSIS — R079 Chest pain, unspecified: Secondary | ICD-10-CM

## 2013-12-27 DIAGNOSIS — I2510 Atherosclerotic heart disease of native coronary artery without angina pectoris: Secondary | ICD-10-CM | POA: Insufficient documentation

## 2013-12-27 DIAGNOSIS — I200 Unstable angina: Secondary | ICD-10-CM

## 2013-12-27 DIAGNOSIS — K219 Gastro-esophageal reflux disease without esophagitis: Secondary | ICD-10-CM | POA: Insufficient documentation

## 2013-12-27 DIAGNOSIS — F17200 Nicotine dependence, unspecified, uncomplicated: Secondary | ICD-10-CM | POA: Diagnosis not present

## 2013-12-27 HISTORY — PX: LEFT HEART CATHETERIZATION WITH CORONARY ANGIOGRAM: SHX5451

## 2013-12-27 LAB — CBC
HCT: 39.2 % (ref 39.0–52.0)
Hemoglobin: 13 g/dL (ref 13.0–17.0)
MCH: 29.1 pg (ref 26.0–34.0)
MCHC: 33.2 g/dL (ref 30.0–36.0)
MCV: 87.7 fL (ref 78.0–100.0)
Platelets: 327 10*3/uL (ref 150–400)
RBC: 4.47 MIL/uL (ref 4.22–5.81)
RDW: 14.4 % (ref 11.5–15.5)
WBC: 6.8 10*3/uL (ref 4.0–10.5)

## 2013-12-27 LAB — BASIC METABOLIC PANEL
BUN: 12 mg/dL (ref 6–23)
CO2: 23 mEq/L (ref 19–32)
Calcium: 9 mg/dL (ref 8.4–10.5)
Chloride: 104 mEq/L (ref 96–112)
Creatinine, Ser: 1.06 mg/dL (ref 0.50–1.35)
GFR calc Af Amer: 82 mL/min — ABNORMAL LOW (ref >90)
GFR calc non Af Amer: 71 mL/min — ABNORMAL LOW (ref >90)
Glucose, Bld: 90 mg/dL (ref 70–99)
Potassium: 3.9 mEq/L (ref 3.7–5.3)
Sodium: 141 mEq/L (ref 137–147)

## 2013-12-27 LAB — PROTIME-INR
INR: 0.98 (ref 0.00–1.49)
Prothrombin Time: 12.8 seconds (ref 11.6–15.2)

## 2013-12-27 SURGERY — COLONOSCOPY
Anesthesia: Moderate Sedation

## 2013-12-27 SURGERY — LEFT HEART CATHETERIZATION WITH CORONARY ANGIOGRAM
Anesthesia: LOCAL

## 2013-12-27 MED ORDER — LIDOCAINE HCL (PF) 1 % IJ SOLN
INTRAMUSCULAR | Status: AC
  Filled 2013-12-27: qty 30

## 2013-12-27 MED ORDER — SODIUM CHLORIDE 0.9 % IJ SOLN: 3.0000 mL | INTRAMUSCULAR | Status: DC | PRN

## 2013-12-27 MED ORDER — ASPIRIN 81 MG PO CHEW
81.0000 mg | CHEWABLE_TABLET | ORAL | Status: AC
  Administered 2013-12-27: 81 mg via ORAL
  Filled 2013-12-27: qty 1

## 2013-12-27 MED ORDER — ACETAMINOPHEN 325 MG PO TABS: 650.0000 mg | ORAL_TABLET | ORAL | Status: DC | PRN

## 2013-12-27 MED ORDER — FENTANYL CITRATE 0.05 MG/ML IJ SOLN
INTRAMUSCULAR | Status: AC
  Filled 2013-12-27: qty 2

## 2013-12-27 MED ORDER — MIDAZOLAM HCL 2 MG/2ML IJ SOLN
INTRAMUSCULAR | Status: AC
  Filled 2013-12-27: qty 2

## 2013-12-27 MED ORDER — ONDANSETRON HCL 4 MG/2ML IJ SOLN: 4.0000 mg | Freq: Four times a day (QID) | INTRAMUSCULAR | Status: DC | PRN

## 2013-12-27 MED ORDER — HEPARIN (PORCINE) IN NACL 2-0.9 UNIT/ML-% IJ SOLN
INTRAMUSCULAR | Status: AC
  Filled 2013-12-27: qty 1000

## 2013-12-27 MED ORDER — SODIUM CHLORIDE 0.9 % IJ SOLN: 3.0000 mL | Freq: Two times a day (BID) | INTRAMUSCULAR | Status: DC

## 2013-12-27 MED ORDER — NITROGLYCERIN 0.2 MG/ML ON CALL CATH LAB
INTRAVENOUS | Status: AC
  Filled 2013-12-27: qty 1

## 2013-12-27 MED ORDER — SODIUM CHLORIDE 0.9 % IV SOLN
1.0000 mL/kg/h | INTRAVENOUS | Status: DC
  Administered 2013-12-27: 1 mL/kg/h via INTRAVENOUS

## 2013-12-27 MED ORDER — HEPARIN SODIUM (PORCINE) 1000 UNIT/ML IJ SOLN
INTRAMUSCULAR | Status: AC
  Filled 2013-12-27: qty 1

## 2013-12-27 MED ORDER — SODIUM CHLORIDE 0.9 % IV SOLN: 250.0000 mL | INTRAVENOUS | Status: DC | PRN

## 2013-12-27 MED ORDER — SODIUM CHLORIDE 0.9 % IV SOLN
INTRAVENOUS | Status: DC
  Administered 2013-12-27: 07:00:00 via INTRAVENOUS

## 2013-12-27 MED ORDER — VERAPAMIL HCL 2.5 MG/ML IV SOLN
INTRAVENOUS | Status: AC
  Filled 2013-12-27: qty 2

## 2013-12-27 NOTE — Telephone Encounter (Signed)
I was not informed about the patient's cough with reported blood-tinged sputum at any point during his initial evaluation or earlier today via staff note. This needs to be evaluated before he makes any plans to travel. Nursing has already communicated this, I also spoke with his daughter Colletta [place].

## 2013-12-27 NOTE — Telephone Encounter (Signed)
Called and spoke with daughter Knute Neu, informed her that patient needed to call his PCP about spitting up blood. Daughter verbalized understanding of plan.

## 2013-12-27 NOTE — Telephone Encounter (Signed)
Cody Velez states that he has been coughing up blood clots since Tuesday ( 12-25-13) several times a day.

## 2013-12-27 NOTE — CV Procedure (Signed)
    Cardiac Catheterization Procedure Note  Name: Cody Velez MRN: [id number] DOB: November 23, 1946  Procedure: Left Heart Cath, Selective Coronary Angiography, LV angiography  Indication: 68 yo WM with refractory left arm and some chest pain. History of HTN.   Procedural Details: The right wrist was prepped, draped, and anesthetized with 1% lidocaine. Using the modified Seldinger technique, a 5 French sheath was introduced into the right radial artery. 3 mg of verapamil was administered through the sheath, weight-based unfractionated heparin was administered intravenously. Standard Judkins catheters were used for selective coronary angiography and left ventriculography. Catheter exchanges were performed over an exchange length guidewire. There were no immediate procedural complications. A TR band was used for radial hemostasis at the completion of the procedure.  The patient was transferred to the post catheterization recovery area for further monitoring.  Procedural Findings: Hemodynamics: AO 143/87 mean 113 mm Hg LV 142/17 mm Hg  Coronary angiography: Coronary dominance: right  Left mainstem: Normal  Left anterior descending (LAD): The LAD wraps around the apex. Diffuse irregularities up to 20%. The first diagonal is moderate in size and normal.  Left circumflex (LCx): Gives rise to 2 large OM branches. Mild luminal irregularities.  Right coronary artery (RCA): Small in caliber. Normal.  Left ventriculography: Left ventricular systolic function is normal, LVEF is estimated at 55-65%, there is no significant mitral regurgitation   Final Conclusions:   1. Mild nonobstructive CAD 2. Normal LV function.  Recommendations: risk factor modification.  [facility]  12/27/2013, 11:04 AM

## 2013-12-27 NOTE — H&P (View-Only) (Signed)
  Clinical Summary Mr. Rathmann is a 68 y.o.male presenting for an office visit after ER visit in December 2014 with chest pain. He is a former patient of Dr. Wall, last seen by Mr. Weaver PA-C in June 2013. Cardiac history is noted below including most recent ischemic workup by Myoview in June 2013 that was negative for ischemia.   Lab work reviewed from his ER assessment, troponin I negative, proBNP 27.5, hemoglobin 13.1, creatinine 1.0. Chest x-ray reported no active cardiopulmonary disease. ECG reviewed showing sinus rhythm with left axis and R' V1-V2, no acute ST segment changes. He was seen by Dr. Lockwood and discharged home.  Mr. 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 He stays busy with work, no regular exercise. He reports symptoms of pressure, fairly intense, occurred in his left arm and left upper chest, both times recently at rest. He has also had more difficulty controlling his blood pressure over the last month.  He reports compliance with his medications. Saw Dr. Fagan recently.   No Known Allergies  Current Outpatient Prescriptions  Medication Sig Dispense Refill  . aspirin 81 MG EC tablet Take 81 mg by mouth at bedtime.       . buPROPion (WELLBUTRIN SR) 200 MG 12 hr tablet Take 200 mg by mouth daily.       . cloNIDine (CATAPRES) 0.2 MG tablet Take 0.2 mg by mouth at bedtime.      . diclofenac (VOLTAREN) 75 MG EC tablet Take 75 mg by mouth 2 (two) times daily.       . esomeprazole (NEXIUM) 40 MG capsule Take 40 mg by mouth daily before breakfast.       . etodolac (LODINE) 500 MG tablet Take 500 mg by mouth 2 (two) times daily.      . fluticasone (FLONASE) 50 MCG/ACT nasal spray Place 2 sprays into the nose daily.       . HYDROcodone-homatropine (HYCODAN) 5-1.5 MG/5ML syrup Take 5 mLs by mouth every 4 (four) hours as needed.       . Linaclotide (LINZESS) 145 MCG CAPS capsule Take 1 capsule (145 mcg total) by mouth daily.  30 minutes before breakfast.  30 capsule  5  . loratadine (CLARITIN) 10 MG tablet Take 10 mg by mouth daily.      . losartan (COZAAR) 100 MG tablet Take 100 mg by mouth every morning.       . meloxicam (MOBIC) 15 MG tablet Take 15 mg by mouth daily.      . menthol-cetylpyridinium (CEPACOL) 3 MG lozenge Take 1 lozenge by mouth as needed for sore throat.      . methocarbamol (ROBAXIN) 500 MG tablet Take 500 mg by mouth every 6 (six) hours as needed. For muscle spasms (Patient takes with Percocet)      . Misc Natural Products (OSTEO BI-FLEX ADV TRIPLE ST PO) Take 2 tablets by mouth at bedtime.      . oxyCODONE-acetaminophen (PERCOCET) 10-325 MG per tablet Take 1 tablet by mouth 3 (three) times daily as needed. For pain      . pravastatin (PRAVACHOL) 10 MG tablet Take 10 mg by mouth daily.      . Pseudoephed-APAP-Guaifenesin (TYLENOL SINUS SEVERE CONGEST) 30-325-200 MG TABS Take 2 tablets by mouth daily as needed.      . tadalafil (CIALIS) 5 MG tablet Take 5 mg by mouth every morning.      . venlafaxine XR (EFFEXOR-XR) 150 MG   24 hr capsule Take 150 mg by mouth every evening.        No current facility-administered medications for this visit.    Past Medical History  Diagnosis Date  . Coronary atherosclerosis of native coronary artery     1.  cath 2006 normal left main, 50-60% mid LAD stenosis, D1 40% stenosis, OM2 30% proximal stenosis, OM3 50% proximal stenosis, RCA distal 20% lesion; LVEF 55-60%. 30% stenosis right renal artery;  2. Echocardiogram 05/31/12: EF 55-60%, no wall motion abnormalities, mild LAE.;   3.   Lexiscan Myoview 06/05/12: No ischemia, EF 58%  . Rectal bleeding   . Hyperlipidemia   . Allergic rhinitis   . Essential hypertension, benign   . Depression   . OSA (obstructive sleep apnea)     CPAP - Dr. [name]  . Asthma   . History of pneumonia 02/2011  . GERD (gastroesophageal reflux disease)   . Arthritis   . Chronic back pain   . PTSD (post-traumatic stress disorder)      [place]    Past Surgical History  Procedure Laterality Date  . Bilateral knee arthroscopy    . Facial cosmetic surgery  ` 1985    "broke face playing softball"  . Back surgery  02/14/12    lumbar OR #7; "today redid L1L2; replaced screws; added bone from hip"  . Fracture surgery      "left wrist; broke it; took spur off"  . Esophagogastroduodenoscopy  10/15/2008      Dr Rourk:Schatzki's ring status post dilation and disruption via 74 F Maloney dilator/ otherwise unremarkable esophagus, small hiatal hernia, multiple fundal gland polyps not manipulated, gastritis, negative H.pylori  . Colonoscopy  10/15/2008    Dr. [name]: tubular adenoma   . Colonoscopy  12/17/2003    HAL:PFXTKW rectal and colon  . Esophagogastroduodenoscopy  06/21/02    IOX:BDZHG sliding hiatal hernia with mild changes of reflux esophagitis limited to gastroesophageal junction.  Noncritical ring at distal esophagus, 3 cm proximal to gastroesophageal junction/Antral gastritis    Family History  Problem Relation Age of Onset  . Emphysema Father   . Heart failure Father   . Lung cancer Father   . Emphysema Maternal Grandmother   . Stroke Maternal Grandmother   . Asthma Other     grandson  . Heart disease Paternal Grandfather   . Colon cancer Mother   . Stroke Mother   . Stroke Sister   . Anesthesia problems Neg Hx   . Hypotension Neg Hx   . Malignant hyperthermia Neg Hx   . Pseudochol deficiency Neg Hx     Social History Mr. San reports that he has never smoked. His smokeless tobacco use includes Chew. Mr. Economou reports that he drinks alcohol.  Review of Systems Reports no palpitations, dizziness, or syncope.  Physical Examination Filed Vitals:   12/25/13 1643  BP: 170/90  Pulse: 85   Filed Weights   12/25/13 1609  Weight: 309 lb (140.161 kg)   Overweight male, appears comfortable at rest, no active symptoms reported. HEENT: Conjunctiva and lids normal, oropharynx clear. Neck: Supple, no  elevated JVP or carotid bruits, no thyromegaly. Lungs: Clear to auscultation, nonlabored breathing at rest. Cardiac: Regular rate and rhythm, S4 noted, soft systolic murmur, no pericardial rub. Abdomen: Soft, nontender, bowel sounds present, no guarding or rebound. Extremities: No pitting edema, distal pulses 2+. Skin: Warm and dry. Musculoskeletal: No kyphosis. Neuropsychiatric: Alert and oriented x3, affect grossly appropriate.   Problem List and Plan  Accelerating angina Symptoms concerning for accelerating angina within the last few weeks on baseline history of CAD by cardiac catheterization in 2006. Blood pressure has also been more difficult to control. Recent ECG reviewed without acute ST segment changes. Cardiac markers negative with ER visit. We have discussed options for further ischemic workup including noninvasive and invasive techniques, and plan is to proceed with a diagnostic cardiac catheterization this week. Reviewed risks and benefits, and he is in agreement to proceed. He will continue on present medical regimen including aspirin, clonidine is being increased at least in the short-term. Followup lab work to be obtained.  CAD, NATIVE VESSEL As noted above at cardiac catheterization 2006, overall mild to moderate disease. Concern is that he has manifested significant progression over time.  Essential hypertension, benign Reportedly more difficult to control over the last month. Clonidine being increased to twice daily, otherwise continue current regimen. Elevated blood pressure could be associated with progressive CAD as well.  HYPERCHOLESTEROLEMIA On Pravachol, followed by Dr. Willey Blade.    [name], M.D., F.A.C.C.

## 2013-12-27 NOTE — Interval H&P Note (Signed)
History and Physical Interval Note:  12/27/2013 10:39 AM  Cody Velez  has presented today for surgery, with the diagnosis of cp  The various methods of treatment have been discussed with the patient and family. After consideration of risks, benefits and other options for treatment, the patient has consented to  Procedure(s): LEFT HEART CATHETERIZATION WITH CORONARY ANGIOGRAM (N/A) as a surgical intervention .  The patient's history has been reviewed, patient examined, no change in status, stable for surgery.  I have reviewed the patient's chart and labs.  Questions were answered to the patient's satisfaction.   Cath Lab Visit (complete for each Cath Lab visit)  Clinical Evaluation Leading to the Procedure:   ACS: no  Non-ACS:    Anginal Classification: CCS III  Anti-ischemic medical therapy: No Therapy  Non-Invasive Test Results: No non-invasive testing performed  Prior CABG: No previous CABG        Cody Velez [facility] 12/27/2013 10:39 AM

## 2013-12-27 NOTE — Discharge Instructions (Signed)

## 2013-12-27 NOTE — Telephone Encounter (Signed)
RE: Cody Velez Received: Today     [name], MD Weston Anna Cc: Merlene Laughter, LPN            He should be able to travel with his reassuring cardiac catheterization results as long as he is able to comply with the post procedure limitations. He should have been given instructions at discharge. Still needs to keep an eye on his blood pressure as he may need further medication adjustments.      Previous Messages      ----- Message -----  From: Weston Anna  Sent: 12/27/2013 1:32 PM  To: [name], MD  Subject: Scheryl Darter   He would like to know if he can go to [facility] this weekend?      Daughter Cody Velez informed the above information.

## 2014-01-01 ENCOUNTER — Ambulatory Visit (INDEPENDENT_AMBULATORY_CARE_PROVIDER_SITE_OTHER): Payer: Federal, State, Local not specified - PPO | Admitting: Cardiology

## 2014-01-01 ENCOUNTER — Encounter: Payer: Self-pay | Admitting: Cardiology

## 2014-01-01 VITALS — BP 138/88 | HR 77 | Ht 76.0 in | Wt 302.0 lb

## 2014-01-01 DIAGNOSIS — R042 Hemoptysis: Secondary | ICD-10-CM

## 2014-01-01 DIAGNOSIS — I2510 Atherosclerotic heart disease of native coronary artery without angina pectoris: Secondary | ICD-10-CM

## 2014-01-01 DIAGNOSIS — I10 Essential (primary) hypertension: Secondary | ICD-10-CM

## 2014-01-01 NOTE — Assessment & Plan Note (Signed)
As outlined above, symptoms have now resolved. Not certain whether he has had sinusitis with drainage, or actual blood-tinged sputum. Recent chest x-ray was negative. He is now on a course of Avelox per Dr. Willey Blade. I explained that he should be observant for any recurrence that may warrant further evaluation. Keep follow up with Dr. Willey Blade.

## 2014-01-01 NOTE — Assessment & Plan Note (Signed)
Blood pressure trend recently better. Continue medical therapy and keep followup with Dr. Willey Blade. Also encouraged diet and exercise.

## 2014-01-01 NOTE — Progress Notes (Signed)
Clinical Summary Cody Velez is a 68 y.o.male seen last week in the office with symptoms concerning for possible accelerating angina with prior documented history of nonobstructive CAD. He was referred for a diagnostic cardiac catheterization which fortunately revealed only mild nonobstructive CAD with normal LVEF. He comes back today for a followup visit, seems reassured by the recent testing. He is not reporting any further left arm or chest discomfort. Blood pressure also looks better today, he continues on the medications outlined below.   He does tell me now in retrospect that he had had some trouble with sinus drainage, congestion, even some productive cough with blood-tinged sputum and a "blood clot" in his tissue. This has happened in the week prior to my visit with him, and he states that it is completely resolved other than feeling fullness in his sinuses. He is now on a course of Avelox per Dr. Willey Blade. No progressive shortness of breath, no syncope.   Recent chest x-ray in December 2014 showed no active cardiopulmonary disease.  No Known Allergies  Current Outpatient Prescriptions  Medication Sig Dispense Refill  . aspirin 81 MG EC tablet Take 81 mg by mouth at bedtime.       Marland Kitchen buPROPion (WELLBUTRIN SR) 200 MG 12 hr tablet Take 200 mg by mouth daily.       . cloNIDine (CATAPRES) 0.2 MG tablet Take 1 tablet (0.2 mg total) by mouth 2 (two) times daily.  180 tablet  3  . esomeprazole (NEXIUM) 40 MG capsule Take 40 mg by mouth daily before breakfast.       . fluticasone (FLONASE) 50 MCG/ACT nasal spray Place 2 sprays into the nose daily.       Marland Kitchen HYDROcodone-homatropine (HYCODAN) 5-1.5 MG/5ML syrup Take 5 mLs by mouth every 4 (four) hours as needed.       . Linaclotide (LINZESS) 145 MCG CAPS capsule Take 1 capsule (145 mcg total) by mouth daily. 30 minutes before breakfast.  30 capsule  5  . loratadine (CLARITIN) 10 MG tablet Take 10 mg by mouth daily.      Marland Kitchen losartan (COZAAR) 100 MG  tablet Take 100 mg by mouth every morning.       . meloxicam (MOBIC) 15 MG tablet Take 15 mg by mouth daily.      . methocarbamol (ROBAXIN) 500 MG tablet Take 500 mg by mouth every 6 (six) hours as needed. For muscle spasms (Patient takes with Percocet)      . Misc Natural Products (OSTEO BI-FLEX ADV TRIPLE ST PO) Take 2 tablets by mouth at bedtime.      . moxifloxacin (AVELOX) 400 MG tablet       . oxyCODONE-acetaminophen (PERCOCET) 10-325 MG per tablet Take 1 tablet by mouth 3 (three) times daily as needed. For pain      . pravastatin (PRAVACHOL) 10 MG tablet Take 10 mg by mouth daily.      . Pseudoephed-APAP-Guaifenesin (TYLENOL SINUS SEVERE CONGEST) 30-325-200 MG TABS Take 2 tablets by mouth daily as needed.      . venlafaxine XR (EFFEXOR-XR) 150 MG 24 hr capsule Take 150 mg by mouth every evening.        No current facility-administered medications for this visit.    Past Medical History  Diagnosis Date  . Coronary atherosclerosis of native coronary artery     Mild nonobstructive CAD at catheterization January 2015  . Rectal bleeding   . Hyperlipidemia   . Allergic rhinitis   . Essential  hypertension, benign   . Depression   . OSA (obstructive sleep apnea)     CPAP - Dr. [name]  . Asthma   . History of pneumonia 02/2011  . GERD (gastroesophageal reflux disease)   . Arthritis   . Chronic back pain   . PTSD (post-traumatic stress disorder)     [place]    Social History Cody Velez reports that he has never smoked. His smokeless tobacco use includes Chew. Cody Velez reports that he drinks alcohol.  Review of Systems No fevers or chills. Negative except as outlined above.  Physical Examination Filed Vitals:   01/01/14 1611  BP: 138/88  Pulse: 77   Filed Weights   01/01/14 1611  Weight: 302 lb (136.986 kg)    Overweight male, appears comfortable at rest, no active symptoms reported.  HEENT: Conjunctiva and lids normal, oropharynx clear.  Neck: Supple, no elevated  JVP or carotid bruits, no thyromegaly.  Lungs: Clear to auscultation, nonlabored breathing at rest.  Cardiac: Regular rate and rhythm, S4 noted, soft systolic murmur, no pericardial rub.  Abdomen: Soft, nontender, bowel sounds present, no guarding or rebound.  Extremities: No pitting edema, distal pulses 2+. Right radial cath site stable.   Problem List and Plan   CAD, NATIVE VESSEL Fortunately, no progressive disease identified by recent cardiac catheterization, only mild CAD with normal LVEF. Continue medical therapy and risk factor modification. Followup visit scheduled in 6 months.  Essential hypertension, benign Blood pressure trend recently better. Continue medical therapy and keep followup with Dr. Willey Blade. Also encouraged diet and exercise.  Cough with hemoptysis As outlined above, symptoms have now resolved. Not certain whether he has had sinusitis with drainage, or actual blood-tinged sputum. Recent chest x-ray was negative. He is now on a course of Avelox per Dr. Willey Blade. I explained that he should be observant for any recurrence that may warrant further evaluation. Keep follow up with Dr. Willey Blade.    [name], M.D., F.A.C.C.

## 2014-01-01 NOTE — Patient Instructions (Signed)
Your physician wants you to follow-up in: 6 months  You will receive a reminder letter in the mail two months in advance. If you don't receive a letter, please call our office to schedule the follow-up appointment.  Your physician recommends that you continue on your current medications as directed. Please refer to the Current Medication list given to you today.  

## 2014-01-01 NOTE — Assessment & Plan Note (Signed)
Fortunately, no progressive disease identified by recent cardiac catheterization, only mild CAD with normal LVEF. Continue medical therapy and risk factor modification. Followup visit scheduled in 6 months.

## 2014-01-04 ENCOUNTER — Encounter: Payer: Federal, State, Local not specified - PPO | Admitting: Cardiology

## 2014-01-16 ENCOUNTER — Telehealth: Payer: Self-pay | Admitting: Cardiology

## 2014-01-16 NOTE — Telephone Encounter (Signed)
Left message with patient to call back, stated he was currently in another doctors office.

## 2014-01-16 NOTE — Telephone Encounter (Signed)
Blood Pressure was 170/100 and need to review medications. Patient is unsure about medication list.

## 2014-01-21 ENCOUNTER — Telehealth: Payer: Self-pay | Admitting: *Deleted

## 2014-01-21 NOTE — Telephone Encounter (Signed)
Reviewed numbers. Blood pressure is not at all well controlled. Presuming he is consistent with his medications including good doses of Cozaar and clonidine, he likely needs additional therapies. I would recommend that he see Dr. Willey Blade soon about medication adjustments. Consideration could be given to starting Norvasc. Also needs to make sure he is compliant with CPAP as sleep apnea could be to contributing.

## 2014-01-21 NOTE — Telephone Encounter (Addendum)
01/29 BP at 8:45pm 189/119 Pulse 89  01/30 BP 131/87 pulse 80 at 12:51am BP 162/105 pulse 68   01/19/2014 BP 196/111 pulse 68 @10 :15am BP 178/110 pulse 77 @ 11:40am BP185/118 Pulse 78 @5 :09pm BP 206/124 Pulse 75 @8 :57pm BP152/99  Pulse 80 @10 :42pm   01/20/14 BP 175/97 Pulse 68--@9 :05am BP 190/116 Pulse 69 @10 :11am @9 :08am 173/108 Pulse 74 @10 :36pm  01/21/14 BP 167/94 Pulse 69 @9 :20 am  Patient said he was instructed to call in his BP log by Edd Fabian.

## 2014-01-21 NOTE — Telephone Encounter (Signed)
Patient and daughter informed. Patient informed nurse that he is compliant with taking medications as prescribed and using CPAP daily. Patient request that this information be sent to Dr. Willey Blade. Nurse advised patient that he is responsible for calling Fagan's office to arrange follow up regarding elevated BP.

## 2014-07-02 ENCOUNTER — Ambulatory Visit: Payer: Federal, State, Local not specified - PPO | Admitting: Cardiology

## 2014-07-05 ENCOUNTER — Encounter: Payer: Self-pay | Admitting: Internal Medicine

## 2014-07-29 ENCOUNTER — Encounter: Payer: Self-pay | Admitting: Cardiology

## 2014-07-29 ENCOUNTER — Ambulatory Visit (INDEPENDENT_AMBULATORY_CARE_PROVIDER_SITE_OTHER): Payer: Federal, State, Local not specified - PPO | Admitting: Cardiology

## 2014-07-29 VITALS — BP 134/92 | HR 88 | Ht 75.0 in | Wt 301.0 lb

## 2014-07-29 DIAGNOSIS — I10 Essential (primary) hypertension: Secondary | ICD-10-CM

## 2014-07-29 DIAGNOSIS — I2510 Atherosclerotic heart disease of native coronary artery without angina pectoris: Secondary | ICD-10-CM

## 2014-07-29 NOTE — Progress Notes (Signed)
Clinical Summary Mr. Hopfensperger is a 68 y.o.male last seen in January. He has been doing well, no angina symptoms. We talked about diet, exercise, and weight loss strategies today. He seems to be more motivated to try and lose some weight.  No changes in his medications, blood pressure in general has been better controlled.  He is still having some cervical neck pains, also lumbar back pains.  Cardiac catheterization in January 2015 revealed mild nonobstructive CAD with normal LVEF.   No Known Allergies  Current Outpatient Prescriptions  Medication Sig Dispense Refill  . aspirin 81 MG EC tablet Take 81 mg by mouth at bedtime.       Marland Kitchen buPROPion (WELLBUTRIN SR) 200 MG 12 hr tablet Take 200 mg by mouth every morning.       Marland Kitchen esomeprazole (NEXIUM) 40 MG capsule Take 40 mg by mouth daily before breakfast.       . etodolac (LODINE) 500 MG tablet Take 500 mg by mouth 2 (two) times daily.      . fluticasone (FLONASE) 50 MCG/ACT nasal spray Place 2 sprays into the nose daily.       . Linaclotide (LINZESS) 145 MCG CAPS capsule Take 1 capsule (145 mcg total) by mouth daily. 30 minutes before breakfast.  30 capsule  5  . loratadine (CLARITIN) 10 MG tablet Take 10 mg by mouth every morning.       Marland Kitchen losartan (COZAAR) 100 MG tablet Take 100 mg by mouth every morning.       . meloxicam (MOBIC) 15 MG tablet Take 15 mg by mouth every morning.       . moxifloxacin (AVELOX) 400 MG tablet       . oxyCODONE-acetaminophen (PERCOCET) 10-325 MG per tablet Take 1 tablet by mouth 3 (three) times daily as needed. For pain      . pravastatin (PRAVACHOL) 10 MG tablet Take 10 mg by mouth every evening.       . tamsulosin (FLOMAX) 0.4 MG CAPS capsule Take 0.4 mg by mouth.      . traZODone (DESYREL) 50 MG tablet Take 50 mg by mouth at bedtime.      Marland Kitchen venlafaxine XR (EFFEXOR-XR) 150 MG 24 hr capsule Take 150 mg by mouth every morning.       . methocarbamol (ROBAXIN) 500 MG tablet Take 500 mg by mouth every 6 (six)  hours as needed. For muscle spasms (Patient takes with Percocet)       No current facility-administered medications for this visit.    Past Medical History  Diagnosis Date  . Coronary atherosclerosis of native coronary artery     Mild nonobstructive CAD at catheterization January 2015  . Rectal bleeding   . Hyperlipidemia   . Allergic rhinitis   . Essential hypertension, benign   . Depression   . OSA (obstructive sleep apnea)     CPAP - Dr. [name]  . Asthma   . History of pneumonia 02/2011  . GERD (gastroesophageal reflux disease)   . Arthritis   . Chronic back pain   . PTSD (post-traumatic stress disorder)     [place]    Social History Mr. Heft reports that he has never smoked. His smokeless tobacco use includes Chew. Mr. Burley reports that he drinks alcohol.  Review of Systems No palpitations, dizziness, syncope. Stable appetite. Otherwise reviewed and negative.  Physical Examination Filed Vitals:   07/29/14 1557  BP: 134/92  Pulse: 88   Filed Weights   07/29/14  1557  Weight: 301 lb (136.533 kg)    Overweight male, appears comfortable at rest, no active symptoms reported.  HEENT: Conjunctiva and lids normal, oropharynx clear.  Neck: Supple, no elevated JVP or carotid bruits, no thyromegaly.  Lungs: Clear to auscultation, nonlabored breathing at rest.  Cardiac: Regular rate and rhythm, S4 noted, soft systolic murmur, no pericardial rub.  Abdomen: Soft, nontender, bowel sounds present, no guarding or rebound.  Extremities: No pitting edema, distal pulses 2+.   Problem List and Plan   CAD, NATIVE VESSEL Mild nonobstructive disease as of cardiac catheterization in January of this year. Continue aspirin and statin. Discussed diet and exercise for weight loss.  Essential hypertension, benign No change to current regimen. Keep follow with Dr. Willey Blade.    [name], M.D., F.A.C.C.

## 2014-07-29 NOTE — Assessment & Plan Note (Signed)
No change to current regimen. Keep follow with Dr. Willey Blade.

## 2014-07-29 NOTE — Assessment & Plan Note (Signed)
Mild nonobstructive disease as of cardiac catheterization in January of this year. Continue aspirin and statin. Discussed diet and exercise for weight loss.

## 2014-07-29 NOTE — Patient Instructions (Signed)

## 2014-08-04 DIAGNOSIS — M25561 Pain in right knee: Secondary | ICD-10-CM | POA: Insufficient documentation

## 2014-08-13 ENCOUNTER — Encounter: Payer: Self-pay | Admitting: Gastroenterology

## 2014-08-13 ENCOUNTER — Ambulatory Visit (INDEPENDENT_AMBULATORY_CARE_PROVIDER_SITE_OTHER): Payer: Federal, State, Local not specified - PPO | Admitting: Gastroenterology

## 2014-08-13 VITALS — BP 152/82 | HR 74 | Temp 98.2°F | Ht 75.0 in | Wt 304.2 lb

## 2014-08-13 DIAGNOSIS — Z860101 Personal history of adenomatous and serrated colon polyps: Secondary | ICD-10-CM

## 2014-08-13 DIAGNOSIS — Z86010 Personal history of colonic polyps: Secondary | ICD-10-CM | POA: Insufficient documentation

## 2014-08-13 DIAGNOSIS — I2510 Atherosclerotic heart disease of native coronary artery without angina pectoris: Secondary | ICD-10-CM

## 2014-08-13 DIAGNOSIS — R151 Fecal smearing: Secondary | ICD-10-CM

## 2014-08-13 DIAGNOSIS — Z800 Family history of malignant neoplasm of digestive organs: Secondary | ICD-10-CM

## 2014-08-13 DIAGNOSIS — K5900 Constipation, unspecified: Secondary | ICD-10-CM

## 2014-08-13 MED ORDER — LINACLOTIDE 290 MCG PO CAPS: 290.0000 ug | ORAL_CAPSULE | Freq: Every day | ORAL | Status: DC

## 2014-08-13 NOTE — Progress Notes (Signed)
Primary Care Physician:  Asencion Noble, MD  Primary Gastroenterologist:  Garfield Cornea, MD   Chief Complaint  Patient presents with  . Colonoscopy    HPI:  Cody Velez is a 68 y.o. male here to schedule surveillance colonoscopy. He has a history of tubular adenoma removed at last colonoscopy in 2009. Mother with history of colon cancer as well. Predominantly constipated. Seems to be worse related to narcotics. May go a couple of times a week sometimes more. Admits that he does not eat very well since his wife died last year.  Not eating regularly and not eating the right things. Fecal seepage over the past one year. Happens about one hour after bowel movement. No rectal bleeding. Pain above both kidneys, seven back surgeries. Having a lot of right knee pain, went to urgent care was told he had gout. No flank pain. GERD controlled on Nexium. No dysphagia.   Current Outpatient Prescriptions  Medication Sig Dispense Refill  . aspirin 81 MG EC tablet Take 81 mg by mouth at bedtime.       Marland Kitchen buPROPion (WELLBUTRIN SR) 200 MG 12 hr tablet Take 200 mg by mouth every morning.       Marland Kitchen esomeprazole (NEXIUM) 40 MG capsule Take 40 mg by mouth daily before breakfast.       . etodolac (LODINE) 500 MG tablet Take 500 mg by mouth 2 (two) times daily.      . fluticasone (FLONASE) 50 MCG/ACT nasal spray Place 2 sprays into the nose daily.       Marland Kitchen loratadine (CLARITIN) 10 MG tablet Take 10 mg by mouth every morning.       Marland Kitchen losartan (COZAAR) 100 MG tablet Take 100 mg by mouth every morning.       . meloxicam (MOBIC) 15 MG tablet Take 15 mg by mouth every morning.       Marland Kitchen oxyCODONE-acetaminophen (PERCOCET) 10-325 MG per tablet Take 1 tablet by mouth 3 (three) times daily as needed. For pain      . pravastatin (PRAVACHOL) 10 MG tablet Take 10 mg by mouth every evening.       . tamsulosin (FLOMAX) 0.4 MG CAPS capsule Take 0.4 mg by mouth.      . traZODone (DESYREL) 50 MG tablet Take 50 mg by mouth at bedtime.       Marland Kitchen venlafaxine XR (EFFEXOR-XR) 150 MG 24 hr capsule Take 150 mg by mouth every morning.        No current facility-administered medications for this visit.    Allergies as of 08/13/2014  . (No Known Allergies)    Past Medical History  Diagnosis Date  . Coronary atherosclerosis of native coronary artery     Mild nonobstructive CAD at catheterization January 2015  . Rectal bleeding   . Hyperlipidemia   . Allergic rhinitis   . Essential hypertension, benign   . Depression   . OSA (obstructive sleep apnea)     CPAP - Dr. [name]  . Asthma   . History of pneumonia 02/2011  . GERD (gastroesophageal reflux disease)   . Arthritis   . Chronic back pain   . PTSD (post-traumatic stress disorder)     [place]    Past Surgical History  Procedure Laterality Date  . Bilateral knee arthroscopy    . Facial cosmetic surgery  ` 1985    "broke face playing softball"  . Back surgery  02/14/12    lumbar OR #7; "today redid L1L2; replaced screws; added  bone from hip"  . Fracture surgery      "left wrist; broke it; took spur off"  . Esophagogastroduodenoscopy  10/15/2008      Dr Rourk:Schatzki's ring status post dilation and disruption via 95 F Maloney dilator/ otherwise unremarkable esophagus, small hiatal hernia, multiple fundal gland polyps not manipulated, gastritis, negative H.pylori  . Colonoscopy  10/15/2008    Dr. [name]: tubular adenoma   . Colonoscopy  12/17/2003    LKJ:ZPHXTA rectal and colon  . Esophagogastroduodenoscopy  06/21/02    VWP:VXYIA sliding hiatal hernia with mild changes of reflux esophagitis limited to gastroesophageal junction.  Noncritical ring at distal esophagus, 3 cm proximal to gastroesophageal junction/Antral gastritis    Family History  Problem Relation Age of Onset  . Emphysema Father   . Heart failure Father   . Lung cancer Father   . Emphysema Maternal Grandmother   . Stroke Maternal Grandmother   . Asthma Other     grandson  . Heart disease Paternal  Grandfather   . Colon cancer Mother   . Stroke Mother   . Stroke Sister   . Anesthesia problems Neg Hx   . Hypotension Neg Hx   . Malignant hyperthermia Neg Hx   . Pseudochol deficiency Neg Hx     History   Social History  . Marital Status: Married    Spouse Name: N/A    Number of Children: 3  . Years of Education: N/A   Occupational History  . [employer]    Social History Main Topics  . Smoking status: Never Smoker   . Smokeless tobacco: Current User    Types: Chew     Comment: Quit x 50 years  . Alcohol Use: 0.0 oz/week    0 Glasses of wine per week     Comment:  wine socially  . Drug Use: Yes    Special: Marijuana     Comment: "last used marijuana ~ 1969"  . Sexual Activity: No   Other Topics Concern  . 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      ROS:  General: Negative for anorexia, weight loss, fever, chills, fatigue, weakness. Eyes: Negative for vision changes.  ENT: Negative for hoarseness, difficulty swallowing , nasal congestion. CV: Negative for chest pain, angina, palpitations, dyspnea on exertion, peripheral edema.  Respiratory: Negative for dyspnea at rest, dyspnea on exertion, cough, sputum, wheezing.  GI: See history of present illness. GU:  Negative for dysuria, hematuria, urinary incontinence, urinary frequency, nocturnal urination.  MS: Back and joint pain.  Derm: Negative for rash or itching.  Neuro: Negative for weakness, abnormal sensation, seizure, frequent headaches, memory loss, confusion.  Psych: Negative for anxiety, depression, suicidal ideation, hallucinations. PTSD Endo: Negative for unusual weight change.  Heme: Negative for bruising or bleeding. Allergy: Negative for rash or hives.    Physical Examination:  BP 152/82  Pulse 74  Temp(Src) 98.2 F (36.8 C) (Oral)  Ht 6\' 3"  (1.905 m)  Wt 304 lb 3.2 oz (137.984 kg)  BMI 38.02 kg/m2   General: Well-nourished,  well-developed in no acute distress.  Head: Normocephalic, atraumatic.   Eyes: Conjunctiva pink, no icterus. Mouth: Oropharyngeal mucosa moist and pink , no lesions erythema or exudate. Neck: Supple without thyromegaly, masses, or lymphadenopathy.  Lungs: Clear to auscultation bilaterally.  Heart: Regular rate and rhythm, no murmurs rubs or gallops.  Abdomen: Bowel sounds are normal, mild tenderness midabdomen on both  right and left side, nondistended, no hepatosplenomegaly or masses, no abdominal bruits or    hernia , no rebound or guarding.   Rectal: Deferred Extremities: No lower extremity edema. No clubbing or deformities.  Neuro: Alert and oriented x 4 , grossly normal neurologically.  Skin: Warm and dry, no rash or jaundice.   Psych: Alert and cooperative, normal mood and affect.

## 2014-08-13 NOTE — Assessment & Plan Note (Signed)
68 year old woman who presents to schedule her for surveillance colonoscopy. He had to cancel back in December when he was undergoing heart workup. He has a personal history of tubular adenoma and family history of colon cancer, mother. Prominently constipated on Percocet. Complains of intermittent fecal soiling about 1 hour after bowel movements. He has a history of anal canal hemorrhoids. Symptoms will likely improve with more complete bowel movements. Plan for colonoscopy in the near future. Patient has had adequate sedation in the past although was not on Percocet and antidepressants at the time. Discussed options of conscious sedation versus deep sedation. Patient wants to pursue conscious sedation. Therefore we will provide with. 25 mg IV 30 minutes before the procedure for augmentation. I have discussed the risks, alternatives, benefits with regards to but not limited to the risk of reaction to medication, bleeding, infection, perforation and the patient is agreeable to proceed. Written consent to be obtained.  Linzess 230mcg daily for constipation. Stressed importance of constipation management to insure adequate bowel prep. Patient voiced understanding.

## 2014-08-13 NOTE — Patient Instructions (Signed)
1. Start Linzess 230mcg once daily on empty stomach for constipation. Samples provided and RX has been sent to your pharmacy if you decide to continue. 2. Colonoscopy with Dr. [name]. See separate instructions.

## 2014-08-21 ENCOUNTER — Encounter (HOSPITAL_COMMUNITY): Payer: Self-pay | Admitting: Pharmacy Technician

## 2014-09-05 ENCOUNTER — Encounter (HOSPITAL_COMMUNITY)
Admission: RE | Disposition: A | Discharge status: Home / Self Care | Payer: Self-pay | Source: Ambulatory Visit | Attending: Internal Medicine

## 2014-09-05 ENCOUNTER — Encounter (HOSPITAL_COMMUNITY): Payer: Self-pay

## 2014-09-05 ENCOUNTER — Ambulatory Visit (HOSPITAL_COMMUNITY)
Admission: RE | Admit: 2014-09-05 | Discharge: 2014-09-05 | Disposition: A | Discharge status: Home / Self Care | Payer: Federal, State, Local not specified - PPO | Source: Ambulatory Visit | Attending: Internal Medicine | Admitting: Internal Medicine

## 2014-09-05 DIAGNOSIS — Z1211 Encounter for screening for malignant neoplasm of colon: Secondary | ICD-10-CM | POA: Diagnosis not present

## 2014-09-05 DIAGNOSIS — J309 Allergic rhinitis, unspecified: Secondary | ICD-10-CM | POA: Diagnosis not present

## 2014-09-05 DIAGNOSIS — K5900 Constipation, unspecified: Secondary | ICD-10-CM

## 2014-09-05 DIAGNOSIS — F329 Major depressive disorder, single episode, unspecified: Secondary | ICD-10-CM | POA: Insufficient documentation

## 2014-09-05 DIAGNOSIS — Z860100 Personal history of colon polyps, unspecified: Secondary | ICD-10-CM | POA: Insufficient documentation

## 2014-09-05 DIAGNOSIS — M1990 Unspecified osteoarthritis, unspecified site: Secondary | ICD-10-CM | POA: Insufficient documentation

## 2014-09-05 DIAGNOSIS — Z800 Family history of malignant neoplasm of digestive organs: Secondary | ICD-10-CM | POA: Diagnosis not present

## 2014-09-05 DIAGNOSIS — I10 Essential (primary) hypertension: Secondary | ICD-10-CM | POA: Insufficient documentation

## 2014-09-05 DIAGNOSIS — M549 Dorsalgia, unspecified: Secondary | ICD-10-CM | POA: Insufficient documentation

## 2014-09-05 DIAGNOSIS — Z7982 Long term (current) use of aspirin: Secondary | ICD-10-CM | POA: Diagnosis not present

## 2014-09-05 DIAGNOSIS — F3289 Other specified depressive episodes: Secondary | ICD-10-CM | POA: Diagnosis not present

## 2014-09-05 DIAGNOSIS — D126 Benign neoplasm of colon, unspecified: Secondary | ICD-10-CM | POA: Insufficient documentation

## 2014-09-05 DIAGNOSIS — I2510 Atherosclerotic heart disease of native coronary artery without angina pectoris: Secondary | ICD-10-CM | POA: Insufficient documentation

## 2014-09-05 DIAGNOSIS — R151 Fecal smearing: Secondary | ICD-10-CM

## 2014-09-05 DIAGNOSIS — F4310 Post-traumatic stress disorder, unspecified: Secondary | ICD-10-CM | POA: Insufficient documentation

## 2014-09-05 DIAGNOSIS — G4733 Obstructive sleep apnea (adult) (pediatric): Secondary | ICD-10-CM | POA: Insufficient documentation

## 2014-09-05 DIAGNOSIS — E785 Hyperlipidemia, unspecified: Secondary | ICD-10-CM | POA: Diagnosis not present

## 2014-09-05 DIAGNOSIS — G8929 Other chronic pain: Secondary | ICD-10-CM | POA: Insufficient documentation

## 2014-09-05 DIAGNOSIS — K219 Gastro-esophageal reflux disease without esophagitis: Secondary | ICD-10-CM | POA: Insufficient documentation

## 2014-09-05 HISTORY — PX: COLONOSCOPY: SHX5424

## 2014-09-05 SURGERY — COLONOSCOPY
Anesthesia: Moderate Sedation

## 2014-09-05 MED ORDER — MIDAZOLAM HCL 5 MG/5ML IJ SOLN
INTRAMUSCULAR | Status: AC
  Filled 2014-09-05: qty 10

## 2014-09-05 MED ORDER — ONDANSETRON HCL 4 MG/2ML IJ SOLN
INTRAMUSCULAR | Status: DC | PRN
  Administered 2014-09-05: 4 mg via INTRAVENOUS

## 2014-09-05 MED ORDER — PROMETHAZINE HCL 25 MG/ML IJ SOLN
25.0000 mg | Freq: Once | INTRAMUSCULAR | Status: AC
  Administered 2014-09-05: 25 mg via INTRAVENOUS

## 2014-09-05 MED ORDER — MEPERIDINE HCL 100 MG/ML IJ SOLN
INTRAMUSCULAR | Status: AC
  Filled 2014-09-05: qty 2

## 2014-09-05 MED ORDER — MIDAZOLAM HCL 5 MG/5ML IJ SOLN
INTRAMUSCULAR | Status: DC | PRN
  Administered 2014-09-05: 1 mg via INTRAVENOUS
  Administered 2014-09-05: 2 mg via INTRAVENOUS

## 2014-09-05 MED ORDER — SODIUM CHLORIDE 0.9 % IJ SOLN
INTRAMUSCULAR | Status: AC
  Filled 2014-09-05: qty 10

## 2014-09-05 MED ORDER — MEPERIDINE HCL 100 MG/ML IJ SOLN
INTRAMUSCULAR | Status: DC | PRN
  Administered 2014-09-05: 50 mg via INTRAVENOUS

## 2014-09-05 MED ORDER — ONDANSETRON HCL 4 MG/2ML IJ SOLN
INTRAMUSCULAR | Status: AC
  Filled 2014-09-05: qty 2

## 2014-09-05 MED ORDER — PROMETHAZINE HCL 25 MG/ML IJ SOLN
INTRAMUSCULAR | Status: AC
  Filled 2014-09-05: qty 1

## 2014-09-05 MED ORDER — SODIUM CHLORIDE 0.9 % IV SOLN
INTRAVENOUS | Status: DC
  Administered 2014-09-05: 10:00:00 via INTRAVENOUS

## 2014-09-05 MED ORDER — STERILE WATER FOR IRRIGATION IR SOLN
Status: DC | PRN
  Administered 2014-09-05: 11:00:00

## 2014-09-05 NOTE — Discharge Instructions (Addendum)
Colonoscopy Discharge Instructions  Read the instructions outlined below and refer to this sheet in the next few weeks. These discharge instructions provide you with general information on caring for yourself after you leave the hospital. Your doctor may also give you specific instructions. While your treatment has been planned according to the most current medical practices available, unavoidable complications occasionally occur. If you have any problems or questions after discharge, call Dr. [name] at [phone]. ACTIVITY  You may resume your regular activity, but move at a slower pace for the next 24 hours.   Take frequent rest periods for the next 24 hours.   Walking will help get rid of the air and reduce the bloated feeling in your belly (abdomen).   No driving for 24 hours (because of the medicine (anesthesia) used during the test).    Do not sign any important legal documents or operate any machinery for 24 hours (because of the anesthesia used during the test).  NUTRITION  Drink plenty of fluids.   You may resume your normal diet as instructed by your doctor.   Begin with a light meal and progress to your normal diet. Heavy or fried foods are harder to digest and may make you feel sick to your stomach (nauseated).   Avoid alcoholic beverages for 24 hours or as instructed.  MEDICATIONS  You may resume your normal medications unless your doctor tells you otherwise.  WHAT YOU CAN EXPECT TODAY  Some feelings of bloating in the abdomen.   Passage of more gas than usual.   Spotting of blood in your stool or on the toilet paper.  IF YOU HAD POLYPS REMOVED DURING THE COLONOSCOPY:  No aspirin products for 7 days or as instructed.   No alcohol for 7 days or as instructed.   Eat a soft diet for the next 24 hours.  FINDING OUT THE RESULTS OF YOUR TEST Not all test results are available during your visit. If your test results are not back during the visit, make an appointment  with your caregiver to find out the results. Do not assume everything is normal if you have not heard from your caregiver or the medical facility. It is important for you to follow up on all of your test results.  SEEK IMMEDIATE MEDICAL ATTENTION IF:  You have more than a spotting of blood in your stool.   Your belly is swollen (abdominal distention).   You are nauseated or vomiting.   You have a temperature over 101.   You have abdominal pain or discomfort that is severe or gets worse throughout the day.     Polyp and constipation information provided  Continue Linzess daily  Further recommendations to follow pending review of pathology report  Colon Polyps Polyps are lumps of extra tissue growing inside the body. Polyps can grow in the large intestine (colon). Most colon polyps are noncancerous (benign). However, some colon polyps can become cancerous over time. Polyps that are larger than a pea may be harmful. To be safe, caregivers remove and test all polyps. CAUSES  Polyps form when mutations in the genes cause your cells to grow and divide even though no more tissue is needed. RISK FACTORS There are a number of risk factors that can increase your chances of getting colon polyps. They include:  Being older than 50 years.  Family history of colon polyps or colon cancer.  Long-term colon diseases, such as colitis or Crohn disease.  Being overweight.  Smoking.  Being  inactive.  Drinking too much alcohol. SYMPTOMS  Most small polyps do not cause symptoms. If symptoms are present, they may include:  Blood in the stool. The stool may look dark red or black.  Constipation or diarrhea that lasts longer than 1 week. DIAGNOSIS People often do not know they have polyps until their caregiver finds them during a regular checkup. Your caregiver can use 4 tests to check for polyps:  Digital rectal exam. The caregiver wears gloves and feels inside the rectum. This test would  find polyps only in the rectum.  Barium enema. The caregiver puts a liquid called barium into your rectum before taking X-rays of your colon. Barium makes your colon look white. Polyps are dark, so they are easy to see in the X-ray pictures.  Sigmoidoscopy. A thin, flexible tube (sigmoidoscope) is placed into your rectum. The sigmoidoscope has a light and tiny camera in it. The caregiver uses the sigmoidoscope to look at the last third of your colon.  Colonoscopy. This test is like sigmoidoscopy, but the caregiver looks at the entire colon. This is the most common method for finding and removing polyps. TREATMENT  Any polyps will be removed during a sigmoidoscopy or colonoscopy. The polyps are then tested for cancer. PREVENTION  To help lower your risk of getting more colon polyps:  Eat plenty of fruits and vegetables. Avoid eating fatty foods.  Do not smoke.  Avoid drinking alcohol.  Exercise every day.  Lose weight if recommended by your caregiver.  Eat plenty of calcium and folate. Foods that are rich in calcium include milk, cheese, and broccoli. Foods that are rich in folate include chickpeas, kidney beans, and spinach. HOME CARE INSTRUCTIONS Keep all follow-up appointments as directed by your caregiver. You may need periodic exams to check for polyps. SEEK MEDICAL CARE IF: You notice bleeding during a bowel movement. Document Released: 09/01/2004 Document Revised: 02/28/2012 Document Reviewed: 02/15/2012 [facility] Patient Information 2015 [place], [place]. This information is not intended to replace advice given to you by your health care provider. Make sure you discuss any questions you have with your health care provider.  Constipation Constipation is when a person has fewer than three bowel movements a week, has difficulty having a bowel movement, or has stools that are dry, hard, or larger than normal. As people grow older, constipation is more common. If you try to fix  constipation with medicines that make you have a bowel movement (laxatives), the problem may get worse. Long-term laxative use may cause the muscles of the colon to become weak. A low-fiber diet, not taking in enough fluids, and taking certain medicines may make constipation worse.  CAUSES   Certain medicines, such as antidepressants, pain medicine, iron supplements, antacids, and water pills.   Certain diseases, such as diabetes, irritable bowel syndrome (IBS), thyroid disease, or depression.   Not drinking enough water.   Not eating enough fiber-rich foods.   Stress or travel.   Lack of physical activity or exercise.   Ignoring the urge to have a bowel movement.   Using laxatives too much.  SIGNS AND SYMPTOMS   Having fewer than three bowel movements a week.   Straining to have a bowel movement.   Having stools that are hard, dry, or larger than normal.   Feeling full or bloated.   Pain in the lower abdomen.   Not feeling relief after having a bowel movement.  DIAGNOSIS  Your health care provider will take a medical history and perform  a physical exam. Further testing may be done for severe constipation. Some tests may include:  A barium enema X-ray to examine your rectum, colon, and, sometimes, your small intestine.   A sigmoidoscopy to examine your lower colon.   A colonoscopy to examine your entire colon. TREATMENT  Treatment will depend on the severity of your constipation and what is causing it. Some dietary treatments include drinking more fluids and eating more fiber-rich foods. Lifestyle treatments may include regular exercise. If these diet and lifestyle recommendations do not help, your health care provider may recommend taking over-the-counter laxative medicines to help you have bowel movements. Prescription medicines may be prescribed if over-the-counter medicines do not work.  HOME CARE INSTRUCTIONS   Eat foods that have a lot of fiber, such  as fruits, vegetables, whole grains, and beans.  Limit foods high in fat and processed sugars, such as french fries, hamburgers, cookies, candies, and soda.   A fiber supplement may be added to your diet if you cannot get enough fiber from foods.   Drink enough fluids to keep your urine clear or pale yellow.   Exercise regularly or as directed by your health care provider.   Go to the restroom when you have the urge to go. Do not hold it.   Only take over-the-counter or prescription medicines as directed by your health care provider. Do not take other medicines for constipation without talking to your health care provider first.  [place] IF:   You have bright red blood in your stool.   Your constipation lasts for more than 4 days or gets worse.   You have abdominal or rectal pain.   You have thin, pencil-like stools.   You have unexplained weight loss. MAKE SURE YOU:   Understand these instructions.  Will watch your condition.  Will get help right away if you are not doing well or get worse. Document Released: 09/03/2004 Document Revised: 12/11/2013 Document Reviewed: 09/17/2013 [employer] Patient Information 2015 [place], [place]. This information is not intended to replace advice given to you by your health care provider. Make sure you discuss any questions you have with your health care provider.

## 2014-09-05 NOTE — Interval H&P Note (Signed)
History and Physical Interval Note:  09/05/2014 10:45 AM  Cody Velez  has presented today for surgery, with the diagnosis of FECAL SOILING, CONSTIPATION, HISTORY OF COLON POLYPS, FAMILY HISTORY OF COLON CANCER  The various methods of treatment have been discussed with the patient and family. After consideration of risks, benefits and other options for treatment, the patient has consented to  Procedure(s) with comments: COLONOSCOPY (N/A) - 7:30-rescheduled 9/17 to [place] notified pt as a surgical intervention .  The patient's history has been reviewed, patient examined, no change in status, stable for surgery.  I have reviewed the patient's chart and labs.  Questions were answered to the patient's satisfaction.     Dartha Rozzell  No change although he says linzess has helped bowel function considerably. Colonoscopy per plan.  The risks, benefits, limitations, alternatives and imponderables have been reviewed with the patient. Questions have been answered. All parties are agreeable.

## 2014-09-05 NOTE — H&P (View-Only) (Signed)
Primary Care Physician:  Asencion Noble, MD  Primary Gastroenterologist:  Garfield Cornea, MD   Chief Complaint  Patient presents with  . Colonoscopy    HPI:  Cody Velez is a 68 y.o. male here to schedule surveillance colonoscopy. He has a history of tubular adenoma removed at last colonoscopy in 2009. Mother with history of colon cancer as well. Predominantly constipated. Seems to be worse related to narcotics. May go a couple of times a week sometimes more. Admits that he does not eat very well since his wife died last year.  Not eating regularly and not eating the right things. Fecal seepage over the past one year. Happens about one hour after bowel movement. No rectal bleeding. Pain above both kidneys, seven back surgeries. Having a lot of right knee pain, went to urgent care was told he had gout. No flank pain. GERD controlled on Nexium. No dysphagia.   Current Outpatient Prescriptions  Medication Sig Dispense Refill  . aspirin 81 MG EC tablet Take 81 mg by mouth at bedtime.       Marland Kitchen buPROPion (WELLBUTRIN SR) 200 MG 12 hr tablet Take 200 mg by mouth every morning.       Marland Kitchen esomeprazole (NEXIUM) 40 MG capsule Take 40 mg by mouth daily before breakfast.       . etodolac (LODINE) 500 MG tablet Take 500 mg by mouth 2 (two) times daily.      . fluticasone (FLONASE) 50 MCG/ACT nasal spray Place 2 sprays into the nose daily.       Marland Kitchen loratadine (CLARITIN) 10 MG tablet Take 10 mg by mouth every morning.       Marland Kitchen losartan (COZAAR) 100 MG tablet Take 100 mg by mouth every morning.       . meloxicam (MOBIC) 15 MG tablet Take 15 mg by mouth every morning.       Marland Kitchen oxyCODONE-acetaminophen (PERCOCET) 10-325 MG per tablet Take 1 tablet by mouth 3 (three) times daily as needed. For pain      . pravastatin (PRAVACHOL) 10 MG tablet Take 10 mg by mouth every evening.       . tamsulosin (FLOMAX) 0.4 MG CAPS capsule Take 0.4 mg by mouth.      . traZODone (DESYREL) 50 MG tablet Take 50 mg by mouth at bedtime.       Marland Kitchen venlafaxine XR (EFFEXOR-XR) 150 MG 24 hr capsule Take 150 mg by mouth every morning.        No current facility-administered medications for this visit.    Allergies as of 08/13/2014  . (No Known Allergies)    Past Medical History  Diagnosis Date  . Coronary atherosclerosis of native coronary artery     Mild nonobstructive CAD at catheterization January 2015  . Rectal bleeding   . Hyperlipidemia   . Allergic rhinitis   . Essential hypertension, benign   . Depression   . OSA (obstructive sleep apnea)     CPAP - Dr. [name]  . Asthma   . History of pneumonia 02/2011  . GERD (gastroesophageal reflux disease)   . Arthritis   . Chronic back pain   . PTSD (post-traumatic stress disorder)     [place]    Past Surgical History  Procedure Laterality Date  . Bilateral knee arthroscopy    . Facial cosmetic surgery  ` 1985    "broke face playing softball"  . Back surgery  02/14/12    lumbar OR #7; "today redid L1L2; replaced screws; added  bone from hip"  . Fracture surgery      "left wrist; broke it; took spur off"  . Esophagogastroduodenoscopy  10/15/2008      Dr Rourk:Schatzki's ring status post dilation and disruption via 72 F Maloney dilator/ otherwise unremarkable esophagus, small hiatal hernia, multiple fundal gland polyps not manipulated, gastritis, negative H.pylori  . Colonoscopy  10/15/2008    Dr. [name]: tubular adenoma   . Colonoscopy  12/17/2003    CZY:SAYTKZ rectal and colon  . Esophagogastroduodenoscopy  06/21/02    SWF:UXNAT sliding hiatal hernia with mild changes of reflux esophagitis limited to gastroesophageal junction.  Noncritical ring at distal esophagus, 3 cm proximal to gastroesophageal junction/Antral gastritis    Family History  Problem Relation Age of Onset  . Emphysema Father   . Heart failure Father   . Lung cancer Father   . Emphysema Maternal Grandmother   . Stroke Maternal Grandmother   . Asthma Other     grandson  . Heart disease Paternal  Grandfather   . Colon cancer Mother   . Stroke Mother   . Stroke Sister   . Anesthesia problems Neg Hx   . Hypotension Neg Hx   . Malignant hyperthermia Neg Hx   . Pseudochol deficiency Neg Hx     History   Social History  . Marital Status: Married    Spouse Name: N/A    Number of Children: 3  . Years of Education: N/A   Occupational History  . [employer]    Social History Main Topics  . Smoking status: Never Smoker   . Smokeless tobacco: Current User    Types: Chew     Comment: Quit x 50 years  . Alcohol Use: 0.0 oz/week    0 Glasses of wine per week     Comment:  wine socially  . Drug Use: Yes    Special: Marijuana     Comment: "last used marijuana ~ 1969"  . Sexual Activity: No   Other Topics Concern  . 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      ROS:  General: Negative for anorexia, weight loss, fever, chills, fatigue, weakness. Eyes: Negative for vision changes.  ENT: Negative for hoarseness, difficulty swallowing , nasal congestion. CV: Negative for chest pain, angina, palpitations, dyspnea on exertion, peripheral edema.  Respiratory: Negative for dyspnea at rest, dyspnea on exertion, cough, sputum, wheezing.  GI: See history of present illness. GU:  Negative for dysuria, hematuria, urinary incontinence, urinary frequency, nocturnal urination.  MS: Back and joint pain.  Derm: Negative for rash or itching.  Neuro: Negative for weakness, abnormal sensation, seizure, frequent headaches, memory loss, confusion.  Psych: Negative for anxiety, depression, suicidal ideation, hallucinations. PTSD Endo: Negative for unusual weight change.  Heme: Negative for bruising or bleeding. Allergy: Negative for rash or hives.    Physical Examination:  BP 152/82  Pulse 74  Temp(Src) 98.2 F (36.8 C) (Oral)  Ht 6\' 3"  (1.905 m)  Wt 304 lb 3.2 oz (137.984 kg)  BMI 38.02 kg/m2   General: Well-nourished,  well-developed in no acute distress.  Head: Normocephalic, atraumatic.   Eyes: Conjunctiva pink, no icterus. Mouth: Oropharyngeal mucosa moist and pink , no lesions erythema or exudate. Neck: Supple without thyromegaly, masses, or lymphadenopathy.  Lungs: Clear to auscultation bilaterally.  Heart: Regular rate and rhythm, no murmurs rubs or gallops.  Abdomen: Bowel sounds are normal, mild tenderness midabdomen on both  right and left side, nondistended, no hepatosplenomegaly or masses, no abdominal bruits or    hernia , no rebound or guarding.   Rectal: Deferred Extremities: No lower extremity edema. No clubbing or deformities.  Neuro: Alert and oriented x 4 , grossly normal neurologically.  Skin: Warm and dry, no rash or jaundice.   Psych: Alert and cooperative, normal mood and affect.

## 2014-09-05 NOTE — Op Note (Signed)
[facility] [address] [place], [address]   COLONOSCOPY PROCEDURE REPORT  PATIENT: Cody Velez, Cody Velez  MR#:         [id number] BIRTHDATE: May 15, 1946 , 68  yrs. old GENDER: Male ENDOSCOPIST: R.  Garfield Cornea, MD FACP FACG REFERRED BY:  Asencion Noble, M.D. PROCEDURE DATE:  09/05/2014 PROCEDURE:     Colonoscopy with biopsy  INDICATIONS: Surveillance examination; history of colonic adenoma; constipation much better with daily Linzess  INFORMED CONSENT:  The risks, benefits, alternatives and imponderables including but not limited to bleeding, perforation as well as the possibility of a missed lesion have been reviewed.  The potential for biopsy, lesion removal, etc. have also been discussed.  Questions have been answered.  All parties agreeable. Please see the history and physical in the medical record for more information.  MEDICATIONS: Versed 3 mg IV and Demerol 50 mg IV and Phenergan 12.5 mg IV. Zofran 4 mg IV.  DESCRIPTION OF PROCEDURE:  After a digital rectal exam was performed, the EC-[id number] ([id number])  colonoscope was advanced from the anus through the rectum and colon to the area of the cecum, ileocecal valve and appendiceal orifice.  The cecum was deeply intubated.  These structures were well-seen and photographed for the record.  From the level of the cecum and ileocecal valve, the scope was slowly and cautiously withdrawn.  The mucosal surfaces were carefully surveyed utilizing scope tip deflection to facilitate fold flattening as needed.  The scope was pulled down into the rectum where a thorough examination including retroflexion was performed.    FINDINGS:  Adequate preparation. Normal rectum. Somewhat redundant colon. (1) diminutive polyp on a fold opposite the ileocecal valve; otherwise, the remainder of the colonic mucosa appeared normal.  THERAPEUTIC / DIAGNOSTIC MANEUVERS PERFORMED:  The above-mentioned polyp was cold  biopsied/removed.  COMPLICATIONS: none  CECAL WITHDRAWAL TIME:  9 minutes  IMPRESSION:  Single colonic polyp-removed as described above  RECOMMENDATIONS: Followup on pathology. Continue Linzess daily   _______________________________ eSigned:  R. Garfield Cornea, MD FACP [facility] 09/05/2014 11:18 AM   CC:

## 2014-09-06 ENCOUNTER — Encounter: Payer: Self-pay | Admitting: Internal Medicine

## 2014-09-06 ENCOUNTER — Encounter (HOSPITAL_COMMUNITY): Payer: Self-pay | Admitting: Internal Medicine

## 2014-10-29 ENCOUNTER — Ambulatory Visit (INDEPENDENT_AMBULATORY_CARE_PROVIDER_SITE_OTHER): Payer: Federal, State, Local not specified - PPO | Admitting: Urology

## 2014-10-29 DIAGNOSIS — N401 Enlarged prostate with lower urinary tract symptoms: Secondary | ICD-10-CM

## 2014-10-29 DIAGNOSIS — N529 Male erectile dysfunction, unspecified: Secondary | ICD-10-CM

## 2014-11-28 ENCOUNTER — Encounter (HOSPITAL_COMMUNITY): Payer: Self-pay | Admitting: Cardiology

## 2014-12-30 ENCOUNTER — Ambulatory Visit (INDEPENDENT_AMBULATORY_CARE_PROVIDER_SITE_OTHER): Payer: Federal, State, Local not specified - PPO | Admitting: Pulmonary Disease

## 2014-12-30 ENCOUNTER — Encounter: Payer: Self-pay | Admitting: Pulmonary Disease

## 2014-12-30 VITALS — BP 144/80 | HR 86 | Temp 97.0°F | Ht 75.0 in | Wt 306.8 lb

## 2014-12-30 DIAGNOSIS — G4733 Obstructive sleep apnea (adult) (pediatric): Secondary | ICD-10-CM

## 2014-12-30 NOTE — Progress Notes (Signed)
Subjective:    Patient ID: Cody Velez, male    DOB: [birth year], 69 y.o.   MRN: [id number]  HPI The patient is a 69 year old male who I've been asked to see for management of obstructive sleep apnea.  He was initially diagnosed in 2002 with severe OSA, with an AHI of 45 events per hour. He was started on C Pap and did well, and followed up with me again in 2012. He continued to do well with his C Pap device, but now comes in for follow-up where he is having issues with his current machine and lack of humidity. His current machine is at least 57-61 years old, and he does not feel like it is functioning properly. He was having issues with poorly fitting nasal pillows, but since these have been adjusted, he is resting better. Despite this, he does have sleep pressure during the day, and can fall asleep having conversations with family and friends. He tells me that his weight is neutral over the last few years, and his Epworth score today is 22.   Sleep Questionnaire What time do you typically go to bed?( Between what hours) 11 PM 11 PM at 1604 on 12/30/14 by Inge Rise, CMA How long does it take you to fall asleep? 5 min 5 min at 1604 on 12/30/14 by Inge Rise, CMA How many times during the night do you wake up? 1 1 at 1604 on 12/30/14 by Inge Rise, CMA What time do you get out of bed to start your day? 0630 0630 at 1604 on 12/30/14 by Inge Rise, CMA Do you drive or operate heavy machinery in your occupation? No No at 1604 on 12/30/14 by Inge Rise, CMA How much has your weight changed (up or down) over the past two years? (In pounds) 10 lb (4.536 kg) 10 lb (4.536 kg) at 1604 on 12/30/14 by Inge Rise, CMA Have you ever had a sleep study before? Yes Yes at 1604 on 12/30/14 by Inge Rise, CMA If yes, location of study? [facility] at 1604 on 12/30/14 by Inge Rise, CMA If yes, date of study? 2002 2002 at 1604 on 12/30/14 by Inge Rise, CMA Do you currently  use CPAP? Yes Yes at 1604 on 12/30/14 by Inge Rise, [place] If so, what pressure? not sure not sure at 1604 on 12/30/14 by Inge Rise, CMA Do you wear oxygen at any time? No No at 1604 on 12/30/14 by Inge Rise, CMA   Review of Systems  Constitutional: Positive for appetite change. Negative for fever and unexpected weight change.  HENT: Positive for congestion, dental problem, sneezing and trouble swallowing. Negative for ear pain, nosebleeds, postnasal drip, rhinorrhea, sinus pressure and sore throat.   Eyes: Negative for redness and itching.  Respiratory: Positive for cough and shortness of breath. Negative for chest tightness and wheezing.   Cardiovascular: Negative for palpitations and leg swelling.  Gastrointestinal: Negative for nausea and vomiting.  Genitourinary: Negative for dysuria.  Musculoskeletal: Positive for arthralgias. Negative for joint swelling.  Skin: Negative for rash.  Neurological: Positive for headaches.  Hematological: Does not bruise/bleed easily.  Psychiatric/Behavioral: Positive for dysphoric mood. The patient is not nervous/anxious.        Objective:   Physical Exam Constitutional:  Overweight male, no acute distress  HENT:  Nares patent without discharge  Oropharynx without exudate, palate and uvula are thick and elongated.  Eyes:  Perrla, eomi, no  scleral icterus  Neck:  No JVD, no TMG  Cardiovascular:  Normal rate, regular rhythm, no rubs or gallops.  No murmurs        Intact distal pulses  Pulmonary :  Normal breath sounds, no stridor or respiratory distress   No rales, rhonchi, or wheezing  Abdominal:  Soft, nondistended, bowel sounds present.  No tenderness noted.   Musculoskeletal:  minimal lower extremity edema noted.  Lymph Nodes:  No cervical lymphadenopathy noted  Skin:  No cyanosis noted  Neurologic:  Alert, appropriate, moves all 4 extremities without obvious deficit.         Assessment & Plan:

## 2014-12-30 NOTE — Patient Instructions (Signed)
Will get you a new cpap machine, and set on the automatic setting. Work on weight loss followup with me again in one year.

## 2014-12-30 NOTE — Assessment & Plan Note (Signed)
The patient has a history of severe obstructive sleep apnea, and has an aged C Pap device that needs to be replaced. He is currently having significant sleep pressure during the day, but it is much improved since he has gotten the appropriate sized nasal pillows. He feels that he is rested in the mornings upon arising, but is in need of a heated humidifier. His machine is about 39-69 years old, and he feels that it needs to be replaced. I will arrange for a new C Pap device, and use the automatic setting. So encouraged him to work aggressively on weight loss, and to follow-up with me in one year.

## 2015-01-14 ENCOUNTER — Ambulatory Visit (INDEPENDENT_AMBULATORY_CARE_PROVIDER_SITE_OTHER): Payer: Federal, State, Local not specified - PPO | Admitting: Urology

## 2015-01-14 DIAGNOSIS — N401 Enlarged prostate with lower urinary tract symptoms: Secondary | ICD-10-CM

## 2015-01-14 DIAGNOSIS — R972 Elevated prostate specific antigen [PSA]: Secondary | ICD-10-CM

## 2015-01-14 DIAGNOSIS — N529 Male erectile dysfunction, unspecified: Secondary | ICD-10-CM

## 2015-02-12 ENCOUNTER — Other Ambulatory Visit: Payer: Self-pay | Admitting: Urology

## 2015-02-12 DIAGNOSIS — R972 Elevated prostate specific antigen [PSA]: Secondary | ICD-10-CM

## 2015-02-19 ENCOUNTER — Other Ambulatory Visit: Payer: Self-pay | Admitting: Urology

## 2015-02-19 DIAGNOSIS — R972 Elevated prostate specific antigen [PSA]: Secondary | ICD-10-CM

## 2015-03-04 ENCOUNTER — Ambulatory Visit (HOSPITAL_COMMUNITY): Admission: RE | Admit: 2015-03-04 | Payer: Medicare Other | Source: Ambulatory Visit

## 2015-03-04 ENCOUNTER — Ambulatory Visit (HOSPITAL_COMMUNITY): Payer: Medicare Other

## 2015-03-11 ENCOUNTER — Encounter (HOSPITAL_COMMUNITY): Payer: Self-pay

## 2015-03-11 ENCOUNTER — Ambulatory Visit (HOSPITAL_COMMUNITY)
Admission: RE | Admit: 2015-03-11 | Discharge: 2015-03-11 | Disposition: A | Discharge status: Home / Self Care | Payer: Federal, State, Local not specified - PPO | Source: Ambulatory Visit | Attending: Urology | Admitting: Urology

## 2015-03-11 DIAGNOSIS — R972 Elevated prostate specific antigen [PSA]: Secondary | ICD-10-CM

## 2015-03-11 MED ORDER — GENTAMICIN SULFATE 40 MG/ML IJ SOLN
INTRAMUSCULAR | Status: AC
  Administered 2015-03-11: 160 mg via INTRAMUSCULAR
  Filled 2015-03-11: qty 4

## 2015-03-11 MED ORDER — LIDOCAINE HCL (PF) 2 % IJ SOLN
INTRAMUSCULAR | Status: AC
  Administered 2015-03-11: 12:00:00
  Filled 2015-03-11: qty 10

## 2015-03-11 MED ORDER — GENTAMICIN SULFATE 40 MG/ML IJ SOLN
80.0000 mg | Freq: Once | INTRAMUSCULAR | Status: AC
  Administered 2015-03-11: 160 mg via INTRAMUSCULAR

## 2015-03-11 NOTE — Discharge Instructions (Signed)
Transrectal Ultrasound-Guided Biopsy °A transrectal ultrasound-guided biopsy is a procedure to remove samples of tissue from your prostate using ultrasound images to guide the procedure. The procedure is usually done to evaluate the prostate gland of men who have an elevated prostate-specific antigen (PSA). PSA is a blood test to screen for prostate cancer. The biopsy samples are taken to check for prostate cancer.  °LET YOUR HEALTH CARE PROVIDER KNOW ABOUT: °· Any allergies you have. °· All medicines you are taking, including vitamins, herbs, eye drops, creams, and over-the-counter medicines. °· Previous problems you or members of your family have had with the use of anesthetics. °· Any blood disorders you have. °· Previous surgeries you have had. °· Medical conditions you have. °RISKS AND COMPLICATIONS °Generally, this is a safe procedure. However, as with any procedure, problems can occur. Possible problems include: °· Infection of your prostate. °· Bleeding from your rectum or blood in your urine. °· Difficulty urinating. °· Nerve damage (this is usually temporary). °· Damage to surrounding structures such as blood vessels, organs, and muscles, which would require other procedures. °BEFORE THE PROCEDURE °· Do not eat or drink anything after midnight on the night before the procedure or as directed by your health care provider. °· Take medicines only as directed by your health care provider. °· Your health care provider may have you stop taking certain medicines 5-7 days before the procedure. °· You will be given an enema before the procedure. During an enema, a liquid is injected into your rectum to clear out waste. °· You may have lab tests the day of your procedure.   °· Plan to have someone take you home after the procedure. °PROCEDURE  °· You will be given medicine to help you relax (sedative) before the procedure. An IV tube will be inserted into one of your veins and used to give fluids and  medicine. °· You will be given antibiotic medicine to reduce the risk of an infection. °· You will be placed on your side for the procedure. °· A probe with lubricated gel will be placed into your rectum, and images will be taken of your prostate and surrounding structures. °· Numbing medicine will be injected into the prostate before the biopsy samples are taken. °· A biopsy needle will then be inserted and guided to your prostate with the use of the ultrasound images. °· Samples of prostate tissue will be taken, and the needle will then be removed. °· The biopsy samples will be sent to a lab to be analyzed. Results are usually back in 2-3 days. °AFTER THE PROCEDURE °· You will be taken to a recovery area where you will be monitored. °· You may have some discomfort in the rectal area. You will be given pain medicines to control this. °· You may be allowed to go home the same day, or you may need to stay in the hospital overnight. °Document Released: 04/22/2014 Document Reviewed: 07/25/2013 °Exit[employer]® Patient Information ©2015 [employer]. This information is not intended to replace advice given to you by your health care provider. Make sure you discuss any questions you have with your health care provider. ° °

## 2015-03-11 NOTE — Sedation Documentation (Signed)
Pt  tolerated procedure well.

## 2015-05-01 ENCOUNTER — Telehealth: Payer: Self-pay | Admitting: *Deleted

## 2015-05-01 NOTE — Telephone Encounter (Signed)
MD aware, patient instructed to contact his PCP r/e elevated BP. Please see scanned BP readings for details.

## 2015-06-02 ENCOUNTER — Encounter: Payer: Self-pay | Admitting: Cardiology

## 2015-06-02 ENCOUNTER — Ambulatory Visit (INDEPENDENT_AMBULATORY_CARE_PROVIDER_SITE_OTHER): Payer: Federal, State, Local not specified - PPO | Admitting: Cardiology

## 2015-06-02 VITALS — BP 128/90 | HR 84 | Ht 75.0 in | Wt 293.0 lb

## 2015-06-02 DIAGNOSIS — I10 Essential (primary) hypertension: Secondary | ICD-10-CM | POA: Diagnosis not present

## 2015-06-02 DIAGNOSIS — I493 Ventricular premature depolarization: Secondary | ICD-10-CM | POA: Diagnosis not present

## 2015-06-02 DIAGNOSIS — I2510 Atherosclerotic heart disease of native coronary artery without angina pectoris: Secondary | ICD-10-CM | POA: Diagnosis not present

## 2015-06-02 NOTE — Patient Instructions (Signed)
Your physician recommends that you continue on your current medications as directed. Please refer to the Current Medication list given to you today. Your physician recommends that you schedule a follow-up appointment in: 1 year. You will receive a reminder letter in the mail in about 10 months reminding you to call and schedule your appointment. If you don't receive this letter, please contact our office. 

## 2015-06-02 NOTE — Progress Notes (Signed)
Cardiology Office Note  Date: 06/02/2015   ID: Cody Velez, DOB Jul 06, 1946, MRN [id number]  PCP: Asencion Noble, MD  Primary Cardiologist: Rozann Lesches, MD   Chief Complaint  Patient presents with  . Coronary Artery Disease    History of Present Illness: Cody Velez is a 69 y.o. male last seen in August 2015. He presents for a routine follow-up visit. No angina symptoms reported, he has retired, has been very active with travel. Recently down in [place], also in [place]. He has had some fluctuations in blood pressure, although recently things have settled down. His current antihypertensives regimen is outlined below. He continues to see Dr. Willey Blade. His weight is down about 10 pounds from the last visit.  Follow-up ECG today shows normal sinus rhythm with PVC. He does not endorse any palpitations, sudden dizziness, or syncope.  He states that he has been diagnosed with prostate cancer, followed through the [facility] medical system.  Past Medical History  Diagnosis Date  . Coronary atherosclerosis of native coronary artery     Mild nonobstructive CAD at catheterization January 2015  . Rectal bleeding   . Hyperlipidemia   . Allergic rhinitis   . Essential hypertension, benign   . Depression   . OSA (obstructive sleep apnea)     CPAP - Dr. [name]  . Asthma   . History of pneumonia 02/2011  . GERD (gastroesophageal reflux disease)   . Arthritis   . Chronic back pain   . PTSD (post-traumatic stress disorder)     [place]    Current Outpatient Prescriptions  Medication Sig Dispense Refill  . amLODipine (NORVASC) 5 MG tablet Take 5 mg by mouth daily.    Marland Kitchen aspirin 81 MG EC tablet Take 81 mg by mouth at bedtime.     Marland Kitchen buPROPion (WELLBUTRIN SR) 200 MG 12 hr tablet Take 200 mg by mouth every morning.     . cetirizine (ZYRTEC) 10 MG tablet Take 10 mg by mouth daily.    Marland Kitchen etodolac (LODINE) 500 MG tablet Take 500 mg by mouth 2 (two) times daily.    . finasteride  (PROSCAR) 5 MG tablet Take 5 mg by mouth daily.    . Linaclotide (LINZESS) 290 MCG CAPS capsule Take 1 capsule (290 mcg total) by mouth daily. 30 capsule 3  . losartan (COZAAR) 100 MG tablet Take 100 mg by mouth every morning.     . Melatonin 3 MG TABS Take by mouth at bedtime.    Marland Kitchen omeprazole (PRILOSEC) 20 MG capsule Take 20 mg by mouth daily.    Marland Kitchen oxyCODONE-acetaminophen (PERCOCET) 10-325 MG per tablet Take 1 tablet by mouth every 4 (four) hours as needed for pain.    . pravastatin (PRAVACHOL) 10 MG tablet Take 10 mg by mouth every evening.     . tamsulosin (FLOMAX) 0.4 MG CAPS capsule Take 0.4 mg by mouth as needed.     . traZODone (DESYREL) 50 MG tablet Take 50 mg by mouth at bedtime.    Marland Kitchen venlafaxine XR (EFFEXOR-XR) 150 MG 24 hr capsule Take 150 mg by mouth every morning.      No current facility-administered medications for this visit.    Allergies:  Review of patient's allergies indicates no known allergies.   Social History: The patient  reports that he quit smoking about 56 years ago. He has quit using smokeless tobacco. His smokeless tobacco use included Chew. He reports that he drinks alcohol. He reports that he does not  use illicit drugs.   ROS:  Please see the history of present illness. Otherwise, complete review of systems is positive for arthritic pains, back pain.  All other systems are reviewed and negative.   Physical Exam: VS:  BP 128/90 mmHg  Pulse 84  Ht 6\' 3"  (1.905 m)  Wt 293 lb (132.904 kg)  BMI 36.62 kg/m2  SpO2 94%, BMI Body mass index is 36.62 kg/(m^2).  Wt Readings from Last 3 Encounters:  06/02/15 293 lb (132.904 kg)  12/30/14 306 lb 12.8 oz (139.164 kg)  08/13/14 304 lb 3.2 oz (137.984 kg)     Overweight male, appears comfortable at rest, no active symptoms reported.  HEENT: Conjunctiva and lids normal, oropharynx clear.  Neck: Supple, no elevated JVP or carotid bruits, no thyromegaly.  Lungs: Clear to auscultation, nonlabored breathing at rest.   Cardiac: Regular rate and rhythm, S4 noted, soft systolic murmur, no pericardial rub.  Abdomen: Soft, nontender, bowel sounds present, no guarding or rebound.  Extremities: No pitting edema, distal pulses 2+.   ECG: ECG is ordered today.   Other Studies Reviewed Today:  Cardiac catheterization 12/27/2013: Coronary angiography: Coronary dominance: right  Left mainstem: Normal  Left anterior descending (LAD): The LAD wraps around the apex. Diffuse irregularities up to 20%. The first diagonal is moderate in size and normal.  Left circumflex (LCx): Gives rise to 2 large OM branches. Mild luminal irregularities.  Right coronary artery (RCA): Small in caliber. Normal.  Left ventriculography: Left ventricular systolic function is normal, LVEF is estimated at 55-65%, there is no significant mitral regurgitation   Final Conclusions:  1. Mild nonobstructive CAD 2. Normal LV function.   Assessment and Plan:  1. Mild nonobstructive CAD by heart catheterization last year. No active angina symptoms. Encouraged weight loss and exercise as tolerated. He continues on aspirin and statin therapy.  2. PVCs, asymptomatic.  3. Essential hypertension, continue Norvasc and Cozaar. Keep follow-up with Dr. Willey Blade.  Current medicines were reviewed with the patient today.   Orders Placed This Encounter  Procedures  . EKG 12-Lead    Disposition: FU with me in 1 year.   Signed, [name], MD, [facility] 06/02/2015 12:04 PM    [facility] [address], [place], [place] [address] Phone: [phone]; Fax: [phone]

## 2015-06-16 ENCOUNTER — Other Ambulatory Visit: Payer: Self-pay

## 2015-07-23 ENCOUNTER — Ambulatory Visit (INDEPENDENT_AMBULATORY_CARE_PROVIDER_SITE_OTHER): Payer: Federal, State, Local not specified - PPO | Admitting: Neurology

## 2015-07-23 ENCOUNTER — Encounter: Payer: Self-pay | Admitting: Neurology

## 2015-07-23 VITALS — BP 138/91 | HR 56 | Ht 76.0 in | Wt 296.4 lb

## 2015-07-23 DIAGNOSIS — E538 Deficiency of other specified B group vitamins: Secondary | ICD-10-CM | POA: Diagnosis not present

## 2015-07-23 DIAGNOSIS — R413 Other amnesia: Secondary | ICD-10-CM | POA: Diagnosis not present

## 2015-07-23 DIAGNOSIS — Z8679 Personal history of other diseases of the circulatory system: Secondary | ICD-10-CM

## 2015-07-23 DIAGNOSIS — I2510 Atherosclerotic heart disease of native coronary artery without angina pectoris: Secondary | ICD-10-CM

## 2015-07-23 HISTORY — DX: Personal history of other diseases of the circulatory system: Z86.79

## 2015-07-23 MED ORDER — DONEPEZIL HCL 5 MG PO TABS: 5.0000 mg | ORAL_TABLET | Freq: Every day | ORAL | Status: DC

## 2015-07-23 NOTE — Patient Instructions (Addendum)
We will get a carotid doppler study, consider a medication for memory. We will check blood work today. Blood pressure and weight management are important.  Stroke Prevention Some medical conditions and behaviors are associated with an increased chance of having a stroke. You may prevent a stroke by making healthy choices and managing medical conditions. HOW CAN I REDUCE MY RISK OF HAVING A STROKE?   Stay physically active. Get at least 30 minutes of activity on most or all days.  Do not smoke. It may also be helpful to avoid exposure to secondhand smoke.  Limit alcohol use. Moderate alcohol use is considered to be:  No more than 2 drinks per day for men.  No more than 1 drink per day for nonpregnant women.  Eat healthy foods. This involves:  Eating 5 or more servings of fruits and vegetables a day.  Making dietary changes that address high blood pressure (hypertension), high cholesterol, diabetes, or obesity.  Manage your cholesterol levels.  Making food choices that are high in fiber and low in saturated fat, trans fat, and cholesterol may control cholesterol levels.  Take any prescribed medicines to control cholesterol as directed by your health care provider.  Manage your diabetes.  Controlling your carbohydrate and sugar intake is recommended to manage diabetes.  Take any prescribed medicines to control diabetes as directed by your health care provider.  Control your hypertension.  Making food choices that are low in salt (sodium), saturated fat, trans fat, and cholesterol is recommended to manage hypertension.  Take any prescribed medicines to control hypertension as directed by your health care provider.  Maintain a healthy weight.  Reducing calorie intake and making food choices that are low in sodium, saturated fat, trans fat, and cholesterol are recommended to manage weight.  Stop drug abuse.  Avoid taking birth control pills.  Talk to your health care  provider about the risks of taking birth control pills if you are over 91 years old, smoke, get migraines, or have ever had a blood clot.  Get evaluated for sleep disorders (sleep apnea).  Talk to your health care provider about getting a sleep evaluation if you snore a lot or have excessive sleepiness.  Take medicines only as directed by your health care provider.  For some people, aspirin or blood thinners (anticoagulants) are helpful in reducing the risk of forming abnormal blood clots that can lead to stroke. If you have the irregular heart rhythm of atrial fibrillation, you should be on a blood thinner unless there is a good reason you cannot take them.  Understand all your medicine instructions.  Make sure that other conditions (such as anemia or atherosclerosis) are addressed. SEEK IMMEDIATE MEDICAL CARE IF:   You have sudden weakness or numbness of the face, arm, or leg, especially on one side of the body.  Your face or eyelid droops to one side.  You have sudden confusion.  You have trouble speaking (aphasia) or understanding.  You have sudden trouble seeing in one or both eyes.  You have sudden trouble walking.  You have dizziness.  You have a loss of balance or coordination.  You have a sudden, severe headache with no known cause.  You have new chest pain or an irregular heartbeat. Any of these symptoms may represent a serious problem that is an emergency. Do not wait to see if the symptoms will go away. Get medical help at once. Call your local emergency services (911 in [place] Do not drive yourself  to the hospital. Document Released: 01/13/2005 Document Revised: 04/22/2014 Document Reviewed: 06/08/2013 [facility] Patient Information 2015 [place], [place]. This information is not intended to replace advice given to you by your health care provider. Make sure you discuss any questions you have with your health care provider.

## 2015-07-23 NOTE — Progress Notes (Signed)
Reason for visit: Memory disorder  Referring physician: Dr. Alcide Evener is a 69 y.o. male  History of present illness:  Cody Velez is a 69 year old left-handed white male with a history of hypertension, dyslipidemia, sleep apnea, and obesity. The patient has reported some issues with memory that developed within the last 6-12 months. He has short-term memory issues, he has some word finding problems and difficulty remembering names. He occasionally will have some issues with directions while driving, he has difficulty keeping up with his medications, but he denies any problems keeping up with appointments or managing his finances. He does have some mild gait instability, but no falls. He reports some difficulty with urgency and frequency of urination, but he has a history of prostate cancer. He has had significant elevations in blood pressure in the past, and the spring of 2016, he was having systolic pressures over 607. This has been better managed more recently. The patient has undergone MRI brain evaluation in 2014, and more recently this year at the [facility] hospital, both studies have been reviewed and show evidence of a moderate level of small vessel ischemic changes. The patient is on aspirin therapy. He reports excessive fatigue, daytime drowsiness, he has had his CPAP interrogated, and the settings appeared to be appropriate. He takes trazodone at night for sleep, but he only takes about 25 mg, and he indicates that he oftentimes has insomnia issues. The patient is concerned about his memory, he comes to this office for an evaluation.  Past Medical History  Diagnosis Date  . Coronary atherosclerosis of native coronary artery     Mild nonobstructive CAD at catheterization January 2015  . Rectal bleeding   . Hyperlipidemia   . Allergic rhinitis   . Essential hypertension, benign   . Depression   . OSA (obstructive sleep apnea)     CPAP - Dr. [name]  . Asthma   . History of  pneumonia 02/2011  . GERD (gastroesophageal reflux disease)   . Arthritis   . Chronic back pain   . PTSD (post-traumatic stress disorder)     [place]  . Memory difficulty 07/23/2015  . History of cerebrovascular disease 07/23/2015    Past Surgical History  Procedure Laterality Date  . Bilateral knee arthroscopy    . Facial cosmetic surgery  ` 1985    "broke face playing softball"  . Back surgery  02/14/12    lumbar OR #7; "today redid L1L2; replaced screws; added bone from hip"  . Fracture surgery      "left wrist; broke it; took spur off"  . Esophagogastroduodenoscopy  10/15/2008      Dr Rourk:Schatzki's ring status post dilation and disruption via 79 F Maloney dilator/ otherwise unremarkable esophagus, small hiatal hernia, multiple fundal gland polyps not manipulated, gastritis, negative H.pylori  . Colonoscopy  10/15/2008    Dr. [name]: tubular adenoma   . Colonoscopy  12/17/2003    PXT:GGYIRS rectal and colon  . Esophagogastroduodenoscopy  06/21/02    WNI:OEVOJ sliding hiatal hernia with mild changes of reflux esophagitis limited to gastroesophageal junction.  Noncritical ring at distal esophagus, 3 cm proximal to gastroesophageal junction/Antral gastritis  . Neck epidural    . Colonoscopy N/A 09/05/2014    Procedure: COLONOSCOPY;  Surgeon: Daneil Dolin, MD;  Location: [facility] END[facility] SUITE;  Service: Endoscopy;  Laterality: N/A;  7:30-rescheduled 9/17 to [place] notified pt  . Left heart catheterization with coronary angiogram N/A 12/27/2013    Procedure:  LEFT HEART CATHETERIZATION WITH CORONARY ANGIOGRAM;  Surgeon: [name], MD;  Location: [facility] CATH LAB;  Service: Cardiovascular;  Laterality: N/A;  . Shoulder surgery      Family History  Problem Relation Age of Onset  . Emphysema Father   . Heart failure Father   . Lung cancer Father   . CAD Father   . Emphysema Maternal Grandmother   . Stroke Maternal Grandmother   . Asthma Other     grandson  . Heart disease Paternal  Grandfather   . Colon cancer Mother   . Stroke Mother   . Breast cancer Mother   . Stroke Sister   . Anesthesia problems Neg Hx   . Hypotension Neg Hx   . Malignant hyperthermia Neg Hx   . Pseudochol deficiency Neg Hx   . Heart attack Brother   . Dementia Paternal Uncle     Social history:  reports that he quit smoking about 56 years ago. His smokeless tobacco use includes Chew. He reports that he drinks alcohol. He reports that he does not use illicit drugs.  Medications:  Prior to Admission medications   Medication Sig Start Date End Date Taking? Authorizing Provider  amLODipine (NORVASC) 10 MG tablet Take 10 mg by mouth daily.   Yes Historical Provider, MD  aspirin 81 MG EC tablet Take 81 mg by mouth at bedtime.    Yes Historical Provider, MD  buPROPion (WELLBUTRIN SR) 200 MG 12 hr tablet Take 200 mg by mouth every morning.    Yes Historical Provider, MD  finasteride (PROSCAR) 5 MG tablet Take 5 mg by mouth daily.   Yes Historical Provider, MD  fluticasone (FLONASE) 50 MCG/ACT nasal spray Place into both nostrils daily.   Yes Historical Provider, MD  Linaclotide (LINZESS) 290 MCG CAPS capsule Take 1 capsule (290 mcg total) by mouth daily. 08/13/14  Yes Mahala Menghini, PA-C  losartan (COZAAR) 100 MG tablet Take 100 mg by mouth every morning.    Yes Historical Provider, MD  methocarbamol (ROBAXIN) 500 MG tablet Take 500 mg by mouth every 6 (six) hours as needed for muscle spasms.   Yes Historical Provider, MD  omeprazole (PRILOSEC) 20 MG capsule Take 20 mg by mouth daily.   Yes Historical Provider, MD  oxyCODONE-acetaminophen (PERCOCET) 10-325 MG per tablet Take 1 tablet by mouth every 4 (four) hours as needed for pain.   Yes Historical Provider, MD  pravastatin (PRAVACHOL) 10 MG tablet Take 10 mg by mouth every evening.    Yes Historical Provider, MD  tadalafil (CIALIS) 5 MG tablet Take 5 mg by mouth daily as needed for erectile dysfunction.   Yes Historical Provider, MD  tamsulosin  (FLOMAX) 0.4 MG CAPS capsule Take 0.4 mg by mouth as needed.    Yes Historical Provider, MD  traZODone (DESYREL) 50 MG tablet Take 50 mg by mouth at bedtime.   Yes Historical Provider, MD  venlafaxine XR (EFFEXOR-XR) 150 MG 24 hr capsule Take 150 mg by mouth every morning.    Yes Historical Provider, MD     No Known Allergies  ROS:  Out of a complete 14 system review of symptoms, the patient complains only of the following symptoms, and all other reviewed systems are negative.  Weight loss, fatigue Ringing in ears, spinning sensation Itching of the ears Blurred vision Constipation Joint pain Memory loss, headache, numbness, weakness, dizziness Depression, not enough sleep, decreased energy, change in appetite Snoring, on CPAP  Blood pressure 138/91, pulse 56, height  6\' 4"  (1.93 m), weight 296 lb 6.4 oz (134.446 kg).  Physical Exam  General: The patient is alert and cooperative at the time of the examination. The patient is moderately to markedly obese.  Eyes: Pupils are equal, round, and reactive to light. Discs are flat bilaterally.  Neck: The neck is supple, no carotid bruits are noted.  Respiratory: The respiratory examination is clear.  Cardiovascular: The cardiovascular examination reveals a regular rate and rhythm, no obvious murmurs or rubs are noted.  Skin: Extremities are without significant edema.  Neurologic Exam  Mental status: The patient is alert and oriented x 3 at the time of the examination. The patient has apparent normal recent and remote memory, with an apparently normal attention span and concentration ability. Mini-Mental Status Examination done today shows a total score of 30/30. The patient is able to name 7 animals in 30 seconds.  Cranial nerves: Facial symmetry is present. There is good sensation of the face to pinprick and soft touch on the right, decreased on the left lower face. The strength of the facial muscles and the muscles to head turning  and shoulder shrug are normal bilaterally. Speech is well enunciated, no aphasia or dysarthria is noted. Extraocular movements are full. Visual fields are full. The tongue is midline, and the patient has symmetric elevation of the soft palate. No obvious hearing deficits are noted.  Motor: The motor testing reveals 5 over 5 strength of all 4 extremities. Good symmetric motor tone is noted throughout.  Sensory: Sensory testing is intact to pinprick, soft touch, vibration sensation, and position sense on all 4 extremities, with exception of some decrease in pinprick sensation on the left arm and left leg as compared to the right, some decrease in vibration sensation on the left foot. No evidence of extinction is noted.  Coordination: Cerebellar testing reveals good finger-nose-finger and heel-to-shin bilaterally.  Gait and station: Gait is normal. Tandem gait is slightly unsteady. Romberg is negative. No drift is seen.  Reflexes: Deep tendon reflexes are symmetric and normal bilaterally. Toes are downgoing bilaterally.   MRI brain 05/25/13:  IMPRESSION: 1. No acute intracranial abnormality. 2. Evidence of chronic small and medium-sized vessel ischemia, with some progression of changes since 2010.  * MRI scan images were reviewed online. I agree with the written report.    Assessment/Plan:  1. Mild memory disturbance  2. Cerebrovascular disease by MRI  3. Sleep apnea on CPAP, insomnia  The patient has multiple risk factors for cerebrovascular disease. He indicates that he has not undergone a carotid Doppler study in the past, we will set this up. The patient is to remain on aspirin. The patient also has sleep apnea, and he continues to have issues with fatigue, insomnia, excessive daytime drowsiness that may also be impairing his cognitive functioning during the day. The cerebrovascular disease is at a moderate level, this may be having some impact on memory. It is not clear whether there  is also an overlying process such as Alzheimer's disease occurring. The patient will be sent for blood work today, he will be followed up in 6 months. He will be started on Aricept at 5 mg, he will call our office in one month if he is tolerating the medication, we will go to the maintenance dose of 10 mg. I have indicated that aggressive management of blood pressure and weight loss may improve risk factors for cerebrovascular disease, and improve his sleep apnea symptoms.  Jill Alexanders MD 07/23/2015 7:33 PM  [facility] [address] [place], [place] [address]  Phone [phone] Fax [phone]

## 2015-07-24 ENCOUNTER — Encounter: Payer: Self-pay | Admitting: Neurology

## 2015-07-24 ENCOUNTER — Telehealth: Payer: Self-pay | Admitting: Neurology

## 2015-07-24 ENCOUNTER — Ambulatory Visit (INDEPENDENT_AMBULATORY_CARE_PROVIDER_SITE_OTHER): Payer: Federal, State, Local not specified - PPO

## 2015-07-24 DIAGNOSIS — E538 Deficiency of other specified B group vitamins: Secondary | ICD-10-CM

## 2015-07-24 MED ORDER — CYANOCOBALAMIN 1000 MCG/ML IJ SOLN
1000.0000 ug | Freq: Once | INTRAMUSCULAR | Status: AC
  Administered 2015-07-24: 1000 ug via INTRAMUSCULAR

## 2015-07-24 NOTE — Telephone Encounter (Signed)
I called patient. The B12 level is low at 144. I will have him come in to get an injection of B12, then start on oral supplementation taking 1000 g daily. The patient indicates that he will be coming into town for a dental appointment, he can come by our office this afternoon.

## 2015-07-25 LAB — VITAMIN B12: Vitamin B-12: 144 pg/mL — ABNORMAL LOW (ref 211–946)

## 2015-07-25 LAB — RPR: RPR Ser Ql: NONREACTIVE

## 2015-07-25 LAB — COPPER, SERUM: Copper: 81 ug/dL (ref 72–166)

## 2015-07-25 LAB — METHYLMALONIC ACID, SERUM: Methylmalonic Acid: 364 nmol/L (ref 0–378)

## 2015-08-06 ENCOUNTER — Ambulatory Visit (INDEPENDENT_AMBULATORY_CARE_PROVIDER_SITE_OTHER): Payer: Federal, State, Local not specified - PPO

## 2015-08-06 DIAGNOSIS — R413 Other amnesia: Secondary | ICD-10-CM | POA: Diagnosis not present

## 2015-08-12 ENCOUNTER — Ambulatory Visit (INDEPENDENT_AMBULATORY_CARE_PROVIDER_SITE_OTHER): Payer: Federal, State, Local not specified - PPO | Admitting: Urology

## 2015-08-12 ENCOUNTER — Other Ambulatory Visit: Payer: Self-pay | Admitting: Urology

## 2015-08-12 DIAGNOSIS — N400 Enlarged prostate without lower urinary tract symptoms: Secondary | ICD-10-CM

## 2015-08-12 DIAGNOSIS — N529 Male erectile dysfunction, unspecified: Secondary | ICD-10-CM | POA: Diagnosis not present

## 2015-08-12 DIAGNOSIS — C61 Malignant neoplasm of prostate: Secondary | ICD-10-CM

## 2015-08-14 DIAGNOSIS — M5412 Radiculopathy, cervical region: Secondary | ICD-10-CM | POA: Insufficient documentation

## 2015-08-15 ENCOUNTER — Other Ambulatory Visit (HOSPITAL_COMMUNITY): Payer: Self-pay | Admitting: Neurological Surgery

## 2015-08-15 DIAGNOSIS — M5412 Radiculopathy, cervical region: Secondary | ICD-10-CM

## 2015-08-18 ENCOUNTER — Ambulatory Visit: Payer: Federal, State, Local not specified - PPO | Admitting: Orthopedic Surgery

## 2015-08-18 ENCOUNTER — Other Ambulatory Visit: Payer: Self-pay | Admitting: Urology

## 2015-08-18 DIAGNOSIS — C61 Malignant neoplasm of prostate: Secondary | ICD-10-CM

## 2015-08-19 ENCOUNTER — Telehealth: Payer: Self-pay | Admitting: Neurology

## 2015-08-19 NOTE — Telephone Encounter (Signed)
I called patient. The carotid Doppler study is unremarkable, no carotid stenosis. Vertebral artery flow is antegrade.

## 2015-08-21 ENCOUNTER — Ambulatory Visit (HOSPITAL_COMMUNITY)
Admission: RE | Admit: 2015-08-21 | Discharge: 2015-08-21 | Disposition: A | Discharge status: Home / Self Care | Payer: Federal, State, Local not specified - PPO | Source: Ambulatory Visit | Attending: Neurological Surgery | Admitting: Neurological Surgery

## 2015-08-21 DIAGNOSIS — M542 Cervicalgia: Secondary | ICD-10-CM | POA: Diagnosis present

## 2015-08-21 DIAGNOSIS — R51 Headache: Secondary | ICD-10-CM | POA: Insufficient documentation

## 2015-08-21 DIAGNOSIS — R531 Weakness: Secondary | ICD-10-CM | POA: Diagnosis not present

## 2015-08-21 DIAGNOSIS — M4802 Spinal stenosis, cervical region: Secondary | ICD-10-CM | POA: Diagnosis not present

## 2015-08-21 DIAGNOSIS — M5412 Radiculopathy, cervical region: Secondary | ICD-10-CM

## 2015-08-21 DIAGNOSIS — R200 Anesthesia of skin: Secondary | ICD-10-CM | POA: Insufficient documentation

## 2015-08-21 DIAGNOSIS — M5031 Other cervical disc degeneration,  high cervical region: Secondary | ICD-10-CM | POA: Insufficient documentation

## 2015-08-21 DIAGNOSIS — M79602 Pain in left arm: Secondary | ICD-10-CM | POA: Diagnosis not present

## 2015-09-03 ENCOUNTER — Ambulatory Visit (HOSPITAL_COMMUNITY)
Admission: RE | Admit: 2015-09-03 | Discharge: 2015-09-03 | Disposition: A | Discharge status: Home / Self Care | Payer: Federal, State, Local not specified - PPO | Source: Ambulatory Visit | Attending: Urology | Admitting: Urology

## 2015-09-03 DIAGNOSIS — C61 Malignant neoplasm of prostate: Secondary | ICD-10-CM | POA: Insufficient documentation

## 2015-09-03 LAB — POCT I-STAT CREATININE: Creatinine, Ser: 1.2 mg/dL (ref 0.61–1.24)

## 2015-09-03 MED ORDER — GADOBENATE DIMEGLUMINE 529 MG/ML IV SOLN
20.0000 mL | Freq: Once | INTRAVENOUS | Status: AC | PRN
  Administered 2015-09-03: 20 mL via INTRAVENOUS

## 2015-09-16 ENCOUNTER — Ambulatory Visit: Payer: Medicare Other | Admitting: Urology

## 2015-09-23 ENCOUNTER — Ambulatory Visit: Payer: Medicare Other | Admitting: Urology

## 2015-10-08 ENCOUNTER — Encounter: Payer: Self-pay | Admitting: Physician Assistant

## 2015-10-08 ENCOUNTER — Telehealth: Payer: Self-pay | Admitting: Cardiology

## 2015-10-08 ENCOUNTER — Ambulatory Visit (INDEPENDENT_AMBULATORY_CARE_PROVIDER_SITE_OTHER): Payer: Federal, State, Local not specified - PPO | Admitting: Physician Assistant

## 2015-10-08 ENCOUNTER — Encounter: Payer: Self-pay | Admitting: *Deleted

## 2015-10-08 VITALS — BP 108/74 | HR 87 | Ht 75.0 in | Wt 287.7 lb

## 2015-10-08 DIAGNOSIS — E7800 Pure hypercholesterolemia, unspecified: Secondary | ICD-10-CM

## 2015-10-08 DIAGNOSIS — I10 Essential (primary) hypertension: Secondary | ICD-10-CM | POA: Diagnosis not present

## 2015-10-08 DIAGNOSIS — R072 Precordial pain: Secondary | ICD-10-CM

## 2015-10-08 DIAGNOSIS — R0789 Other chest pain: Secondary | ICD-10-CM

## 2015-10-08 DIAGNOSIS — I2510 Atherosclerotic heart disease of native coronary artery without angina pectoris: Secondary | ICD-10-CM

## 2015-10-08 DIAGNOSIS — R079 Chest pain, unspecified: Secondary | ICD-10-CM | POA: Insufficient documentation

## 2015-10-08 NOTE — Telephone Encounter (Signed)
Patient called wanting to speak with nurse about chest pressure w/o other symptoms that he has been experiencing for past week.  Describes as a different pain than he normally has. Located left side of chest under breast. Has upcoming surgery scheduled 10/14/15

## 2015-10-08 NOTE — Assessment & Plan Note (Signed)
Patient comes in today with 2-3 week history of chest tightness radiating to his abdomen. It occurs at rest and with exertion and last about 10 minutes. He had nonobstructive CAD on cath in January 2015. He has hypertension, hyperlipidemia and family history of CAD. He is scheduled for next surgery next Tuesday. Because of this we will proceed with Lexi scan Myoview to rule out ischemia.

## 2015-10-08 NOTE — Patient Instructions (Signed)
Your physician recommends that you schedule a follow-up appointment as scheduled  Your physician recommends that you continue on your current medications as directed. Please refer to the Current Medication list given to you today.  Your physician has requested that you have a lexiscan myoview. For further information please visit [email or web address]. Please follow instruction sheet, as given.  If you need a refill on your cardiac medications before your next appointment, please call your pharmacy.  Thank you for choosing [facility]!

## 2015-10-08 NOTE — Telephone Encounter (Signed)
Patient c/o chest pressure under his left breast that goes down towards abdomen, lasting about 10 minutes or longer sometimes. Patient rated this chest pressure a #5/10. No c/o dizziness or sob. Patient does not have nitroglycerin on hand. Patient's daughter whom is a nurse suggested that patient contact our office. Patient was given an appointment to see NP at the [facility] office today at 1:00 pm.

## 2015-10-08 NOTE — Telephone Encounter (Signed)
Reviewed note and plan. He has a history of fluctuating blood pressure control, and overall reassuring cardiac catheterization in January of last year showing only mild nonobstructive CAD. Agree with planned office visit for review of symptoms and medications as well as ECG. He may need to have some adjustments in his therapy.

## 2015-10-08 NOTE — Assessment & Plan Note (Signed)
Controlled on Pravachol

## 2015-10-08 NOTE — Assessment & Plan Note (Signed)
Blood pressure well controlled

## 2015-10-08 NOTE — Progress Notes (Signed)
Cardiology Office Note   Date:  10/08/2015   ID:  [name], DOB [birth year], MRN [id number]  PCP:  Asencion Noble, MD  Cardiologist: Dr. Domenic Polite Chief Complaint: Chest pain    History of Present Illness: Cody Velez is a 69 y.o. male who presents for chest pain and hypertension. He has a history of mild nonobstructive CAD on cardiac catheterization in January 2015, EF 55-60%.. He also has essential hypertension treated with Norvasc and Cozaar. It has been up and down. He has asymptomatic PVCs. He called in to the office today complaining of chest pressure under his left breast going down to his abdomen that lasted 10 minutes. He was added onto my schedule.  Patient states he develops chest tightness under his left breast that radiates down into his abdomen. It's been going on for 2-3 weeks and last approximately 10 minutes. It occurs at any time at rest or with exertion. Nothing makes it better or worse. He denies any nausea, vomiting, abdominal pain, radiation to his neck or arms, diaphoresis, dizziness, palpitations or presyncope. His reflux has been controlled. He had a mild episode walking into the office today. It went away before his EKG was done. He is scheduled for next surgery by Dr. Ellene Route next Tuesday. He has trouble walking because of chronic hip pain but can go about 3 miles if he stops frequently. He has no chest pain with this. His brother developed heart disease in his 67s and sister is heart disease as well.    Past Medical History  Diagnosis Date  . Coronary atherosclerosis of native coronary artery     Mild nonobstructive CAD at catheterization January 2015  . Rectal bleeding   . Hyperlipidemia   . Allergic rhinitis   . Essential hypertension, benign   . Depression   . OSA (obstructive sleep apnea)     CPAP - Dr. [name]  . Asthma   . History of pneumonia 02/2011  . GERD (gastroesophageal reflux disease)   . Arthritis   . Chronic back pain   . PTSD  (post-traumatic stress disorder)     [place]  . Memory difficulty 07/23/2015  . History of cerebrovascular disease 07/23/2015  . B12 deficiency     Past Surgical History  Procedure Laterality Date  . Bilateral knee arthroscopy    . Facial cosmetic surgery  ` 1985    "broke face playing softball"  . Back surgery  02/14/12    lumbar OR #7; "today redid L1L2; replaced screws; added bone from hip"  . Fracture surgery      "left wrist; broke it; took spur off"  . Esophagogastroduodenoscopy  10/15/2008      Dr Rourk:Schatzki's ring status post dilation and disruption via 69 F Maloney dilator/ otherwise unremarkable esophagus, small hiatal hernia, multiple fundal gland polyps not manipulated, gastritis, negative H.pylori  . Colonoscopy  10/15/2008    Dr. [name]: tubular adenoma   . Colonoscopy  12/17/2003    NLZ:JQBHAL rectal and colon  . Esophagogastroduodenoscopy  06/21/02    PFX:TKWIO sliding hiatal hernia with mild changes of reflux esophagitis limited to gastroesophageal junction.  Noncritical ring at distal esophagus, 3 cm proximal to gastroesophageal junction/Antral gastritis  . Neck epidural    . Colonoscopy N/A 09/05/2014    Procedure: COLONOSCOPY;  Surgeon: Daneil Dolin, MD;  Location: [facility] END[facility] SUITE;  Service: Endoscopy;  Laterality: N/A;  7:30-rescheduled 9/17 to [place] notified pt  . Left heart catheterization with coronary angiogram N/A 12/27/2013  Procedure: LEFT HEART CATHETERIZATION WITH CORONARY ANGIOGRAM;  Surgeon: [name], MD;  Location: [facility] CATH LAB;  Service: Cardiovascular;  Laterality: N/A;  . Shoulder surgery       Current Outpatient Prescriptions  Medication Sig Dispense Refill  . amLODipine (NORVASC) 10 MG tablet Take 10 mg by mouth daily.    Marland Kitchen buPROPion (WELLBUTRIN SR) 200 MG 12 hr tablet Take 200 mg by mouth every morning.     . donepezil (ARICEPT) 5 MG tablet Take 1 tablet (5 mg total) by mouth at bedtime. 30 tablet 1  . finasteride (PROSCAR) 5 MG  tablet Take 5 mg by mouth daily.    . Linaclotide (LINZESS) 290 MCG CAPS capsule Take 1 capsule (290 mcg total) by mouth daily. 30 capsule 3  . losartan (COZAAR) 100 MG tablet Take 100 mg by mouth every morning.     . methocarbamol (ROBAXIN) 500 MG tablet Take 500 mg by mouth every 6 (six) hours as needed for muscle spasms.    Marland Kitchen omeprazole (PRILOSEC) 20 MG capsule Take 20 mg by mouth daily.    Marland Kitchen oxyCODONE-acetaminophen (PERCOCET) 10-325 MG per tablet Take 1 tablet by mouth every 4 (four) hours as needed for pain.    . pravastatin (PRAVACHOL) 10 MG tablet Take 10 mg by mouth every evening.     . tadalafil (CIALIS) 5 MG tablet Take 5 mg by mouth daily as needed for erectile dysfunction.    . tamsulosin (FLOMAX) 0.4 MG CAPS capsule Take 0.4 mg by mouth as needed.     . traZODone (DESYREL) 50 MG tablet Take 50 mg by mouth at bedtime.    Marland Kitchen venlafaxine XR (EFFEXOR-XR) 150 MG 24 hr capsule Take 150 mg by mouth every morning.     . gabapentin (NEURONTIN) 300 MG capsule      No current facility-administered medications for this visit.    Allergies:   Review of patient's allergies indicates no known allergies.    Social History:  The patient  reports that he quit smoking about 56 years ago. His smokeless tobacco use includes Chew. He reports that he drinks alcohol. He reports that he does not use illicit drugs.   Family History:  The patient's    family history includes Asthma in his other; Breast cancer in his mother; CAD in his father; Colon cancer in his mother; Dementia in his paternal uncle; Emphysema in his father and maternal grandmother; Heart attack in his brother; Heart disease in his paternal grandfather; Heart failure in his father; Lung cancer in his father; Stroke in his maternal grandmother, mother, and sister. There is no history of Anesthesia problems, Hypotension, Malignant hyperthermia, or Pseudochol deficiency.    ROS:  Please see the history of present illness.   Otherwise, review  of systems are positive for chronic neck pain.   All other systems are reviewed and negative.    PHYSICAL EXAM: VS:  BP 108/74 mmHg  Pulse 87  Ht 6\' 3"  (1.905 m)  Wt 287 lb 11.2 oz (130.5 kg)  BMI 35.96 kg/m2  SpO2 95% , BMI Body mass index is 35.96 kg/(m^2). GEN: Obese in no acute distress Neck: no JVD, HJR, carotid bruits, or masses Cardiac: RRR; positive S4, no murmurs, rubs, thrill or heave,  Respiratory:  clear to auscultation bilaterally, normal work of breathing GI: soft, nontender, nondistended, + BS MS: no deformity or atrophy Extremities: without cyanosis, clubbing, edema, good distal pulses bilaterally.  Skin: warm and dry, no rash Neuro:  Strength  and sensation are intact    EKG:  EKG is ordered today. The ekg ordered today demonstrates normal sinus rhythm, normal EKG Recent Labs: 09/03/2015: Creatinine, Ser 1.20    Lipid Panel    Component Value Date/Time   CHOL 175 05/30/2012 1913   TRIG 194* 05/30/2012 1913   HDL 28* 05/30/2012 1913   CHOLHDL 6.3 05/30/2012 1913   VLDL 39 05/30/2012 1913   LDLCALC 108* 05/30/2012 1913      Wt Readings from Last 3 Encounters:  10/08/15 287 lb 11.2 oz (130.5 kg)  08/21/15 279 lb (126.554 kg)  07/23/15 296 lb 6.4 oz (134.446 kg)      Other studies Reviewed: Additional studies/ records that were reviewed today include and review of the records demonstrates:  Cardiac catheterization 12/27/2013: Coronary angiography: Coronary dominance: right  Left mainstem: Normal  Left anterior descending (LAD): The LAD wraps around the apex. Diffuse irregularities up to 20%. The first diagonal is moderate in size and normal.  Left circumflex (LCx): Gives rise to 2 large OM branches. Mild luminal irregularities.  Right coronary artery (RCA): Small in caliber. Normal.  Left ventriculography: Left ventricular systolic function is normal, LVEF is estimated at 55-65%, there is no significant mitral regurgitation   Final Conclusions:     1. Mild nonobstructive CAD 2. Normal LV function.      ASSESSMENT AND PLAN:  Chest pain Patient comes in today with 2-3 week history of chest tightness radiating to his abdomen. It occurs at rest and with exertion and last about 10 minutes. He had nonobstructive CAD on cath in January 2015. He has hypertension, hyperlipidemia and family history of CAD. He is scheduled for next surgery next Tuesday. Because of this we will proceed with Lexi scan Myoview to rule out ischemia.  Essential hypertension, benign Blood pressure well controlled  HYPERCHOLESTEROLEMIA Controlled on Pravachol    Signed, Ermalinda Barrios, PA-C  10/08/2015 1:15 PM    [facility] [address], [place], [place]  [address] Phone: [phone]; Fax: [phone]

## 2015-10-10 ENCOUNTER — Encounter (HOSPITAL_COMMUNITY): Payer: Self-pay

## 2015-10-10 ENCOUNTER — Encounter (HOSPITAL_COMMUNITY)
Admission: RE | Admit: 2015-10-10 | Discharge: 2015-10-10 | Disposition: A | Discharge status: Home / Self Care | Payer: Federal, State, Local not specified - PPO | Source: Ambulatory Visit | Attending: Physician Assistant | Admitting: Physician Assistant

## 2015-10-10 ENCOUNTER — Inpatient Hospital Stay (HOSPITAL_COMMUNITY): Admission: RE | Admit: 2015-10-10 | Payer: Medicare Other | Source: Ambulatory Visit

## 2015-10-10 DIAGNOSIS — R072 Precordial pain: Secondary | ICD-10-CM

## 2015-10-10 LAB — NM MYOCAR MULTI W/SPECT W/WALL MOTION / EF
LV dias vol: 128 mL
LV sys vol: 55 mL
Peak HR: 86 {beats}/min
RATE: 0.33
Rest HR: 78 {beats}/min
SDS: 1
SRS: 0
SSS: 1
TID: 1.02

## 2015-10-10 MED ORDER — SODIUM CHLORIDE 0.9 % IJ SOLN
INTRAMUSCULAR | Status: AC
  Filled 2015-10-10: qty 45

## 2015-10-10 MED ORDER — TECHNETIUM TC 99M SESTAMIBI GENERIC - CARDIOLITE
10.0000 | Freq: Once | INTRAVENOUS | Status: AC | PRN
  Administered 2015-10-10: 10 via INTRAVENOUS

## 2015-10-10 MED ORDER — TECHNETIUM TC 99M SESTAMIBI - CARDIOLITE
30.0000 | Freq: Once | INTRAVENOUS | Status: AC | PRN
  Administered 2015-10-10: 30 via INTRAVENOUS

## 2015-10-10 MED ORDER — SODIUM CHLORIDE 0.9 % IJ SOLN
INTRAMUSCULAR | Status: AC
  Administered 2015-10-10: 10 mL via INTRAVENOUS
  Filled 2015-10-10: qty 3

## 2015-10-10 MED ORDER — REGADENOSON 0.4 MG/5ML IV SOLN
INTRAVENOUS | Status: AC
  Administered 2015-10-10: 0.4 mg via INTRAVENOUS
  Filled 2015-10-10: qty 5

## 2015-12-03 ENCOUNTER — Other Ambulatory Visit: Payer: Self-pay | Admitting: Urology

## 2015-12-03 DIAGNOSIS — N400 Enlarged prostate without lower urinary tract symptoms: Secondary | ICD-10-CM

## 2015-12-09 ENCOUNTER — Ambulatory Visit (HOSPITAL_COMMUNITY): Admission: RE | Admit: 2015-12-09 | Payer: Medicare Other | Source: Ambulatory Visit

## 2015-12-09 ENCOUNTER — Other Ambulatory Visit: Payer: Self-pay | Admitting: Neurology

## 2015-12-31 ENCOUNTER — Ambulatory Visit: Payer: Medicare Other | Admitting: Pulmonary Disease

## 2016-01-06 ENCOUNTER — Ambulatory Visit (HOSPITAL_COMMUNITY): Admission: RE | Admit: 2016-01-06 | Payer: Medicare Other | Source: Ambulatory Visit

## 2016-01-06 ENCOUNTER — Other Ambulatory Visit: Payer: Self-pay | Admitting: Neurology

## 2016-01-08 ENCOUNTER — Ambulatory Visit (INDEPENDENT_AMBULATORY_CARE_PROVIDER_SITE_OTHER): Payer: Federal, State, Local not specified - PPO | Admitting: Pulmonary Disease

## 2016-01-08 ENCOUNTER — Encounter: Payer: Self-pay | Admitting: Pulmonary Disease

## 2016-01-08 VITALS — BP 118/72 | HR 89 | Ht 75.0 in | Wt 290.8 lb

## 2016-01-08 DIAGNOSIS — G4733 Obstructive sleep apnea (adult) (pediatric): Secondary | ICD-10-CM

## 2016-01-08 DIAGNOSIS — R413 Other amnesia: Secondary | ICD-10-CM | POA: Diagnosis not present

## 2016-01-08 NOTE — Progress Notes (Signed)
   Subjective:    Patient ID: Cody Velez, male    DOB: 1946-09-27, 70 y.o.   MRN: [id number]  HPI   Chief Complaint  Patient presents with  . Follow-up    Former [place] pt. Pt states that he wears his CPAP nightly for about 7-12 hours. He also wears it during the day if he takes a nap. Pt needs new supplies.     for FU of obstructive sleep apnea. He was initially diagnosed in 2002 with severe OSA, with an AHI of 45 events per hour. Got new CPAP in 12/2014  Had neck & back surgery  On aricept  Mask ok, pr ok, compliant C/o dryness  Review of Systems neg for any significant sore throat, dysphagia, itching, sneezing, nasal congestion or excess/ purulent secretions, fever, chills, sweats, unintended wt loss, pleuritic or exertional cp, hempoptysis, orthopnea pnd or change in chronic leg swelling. Also denies presyncope, palpitations, heartburn, abdominal pain, nausea, vomiting, diarrhea or change in bowel or urinary habits, dysuria,hematuria, rash, arthralgias, visual complaints, headache, numbness weakness or ataxia.     Objective:   Physical Exam  Gen. Pleasant, obese, in no distress ENT - no lesions, no post nasal drip Neck: No JVD, no thyromegaly, no carotid bruits Lungs: no use of accessory muscles, no dullness to percussion, decreased without rales or rhonchi  Cardiovascular: Rhythm regular, heart sounds  normal, no murmurs or gallops, no peripheral edema Musculoskeletal: No deformities, no cyanosis or clubbing , no tremors        Assessment & Plan:

## 2016-01-08 NOTE — Assessment & Plan Note (Signed)
stable °

## 2016-01-08 NOTE — Patient Instructions (Signed)
CPAP supplies will be renewed x 1 year 

## 2016-01-08 NOTE — Assessment & Plan Note (Signed)
CPAP supplies will be renewed x 1 year  Weight loss encouraged, compliance with goal of at least 4-6 hrs every night is the expectation. Advised against medications with sedative side effects Cautioned against driving when sleepy - understanding that sleepiness will vary on a day to day basis  

## 2016-01-13 ENCOUNTER — Ambulatory Visit (INDEPENDENT_AMBULATORY_CARE_PROVIDER_SITE_OTHER): Payer: Federal, State, Local not specified - PPO | Admitting: Urology

## 2016-01-13 ENCOUNTER — Other Ambulatory Visit: Payer: Self-pay | Admitting: Urology

## 2016-01-13 DIAGNOSIS — R1031 Right lower quadrant pain: Secondary | ICD-10-CM

## 2016-01-13 DIAGNOSIS — R310 Gross hematuria: Secondary | ICD-10-CM | POA: Diagnosis not present

## 2016-01-14 ENCOUNTER — Ambulatory Visit (HOSPITAL_COMMUNITY)
Admission: RE | Admit: 2016-01-14 | Discharge: 2016-01-14 | Disposition: A | Discharge status: Home / Self Care | Payer: Federal, State, Local not specified - PPO | Source: Ambulatory Visit | Attending: Urology | Admitting: Urology

## 2016-01-14 DIAGNOSIS — N200 Calculus of kidney: Secondary | ICD-10-CM | POA: Diagnosis not present

## 2016-01-14 DIAGNOSIS — R310 Gross hematuria: Secondary | ICD-10-CM

## 2016-01-14 DIAGNOSIS — K760 Fatty (change of) liver, not elsewhere classified: Secondary | ICD-10-CM | POA: Insufficient documentation

## 2016-01-14 DIAGNOSIS — N50819 Testicular pain, unspecified: Secondary | ICD-10-CM | POA: Insufficient documentation

## 2016-01-14 DIAGNOSIS — I2510 Atherosclerotic heart disease of native coronary artery without angina pectoris: Secondary | ICD-10-CM | POA: Insufficient documentation

## 2016-01-14 DIAGNOSIS — C61 Malignant neoplasm of prostate: Secondary | ICD-10-CM | POA: Diagnosis not present

## 2016-01-14 DIAGNOSIS — R1031 Right lower quadrant pain: Secondary | ICD-10-CM

## 2016-01-14 DIAGNOSIS — N201 Calculus of ureter: Secondary | ICD-10-CM | POA: Insufficient documentation

## 2016-01-20 ENCOUNTER — Other Ambulatory Visit: Payer: Self-pay | Admitting: Urology

## 2016-01-22 ENCOUNTER — Other Ambulatory Visit (HOSPITAL_COMMUNITY): Payer: Medicare Other

## 2016-01-22 NOTE — Patient Instructions (Signed)
Cody Velez  01/22/2016     @PREFPERIOPPHARMACY @   Your procedure is scheduled on 01/27/16  Report to [facility] at 0800 A.M.  Call this number if you have problems the morning of surgery:  [phone]   Remember:  Do not eat food or drink liquids after midnight.  Take these medicines the morning of surgery with A SIP OF WATER norvasc, wellbutrin, valium, proscar, cozaar, prilosec, robaxin, effexor   Do not wear jewelry, make-up or nail polish.  Do not wear lotions, powders, or perfumes.  You may wear deodorant.  Do not shave 48 hours prior to surgery.  Men may shave face and neck.  Do not bring valuables to the hospital.  [facility] is not responsible for any belongings or valuables.  Contacts, dentures or bridgework may not be worn into surgery.  Leave your suitcase in the car.  After surgery it may be brought to your room.  For patients admitted to the hospital, discharge time will be determined by your treatment team.  Patients discharged the day of surgery will not be allowed to drive home.   Name and phone number of your driver:   family Special instructions:  n/a  Please read over the following fact sheets that you were given. Anesthesia Post-op Instructions and Care and Recovery After Surgery  Kidney Stones Kidney stones (urolithiasis) are deposits that form inside your kidneys. The intense pain is caused by the stone moving through the urinary tract. When the stone moves, the ureter goes into spasm around the stone. The stone is usually passed in the urine.  CAUSES   A disorder that makes certain neck glands produce too much parathyroid hormone (primary hyperparathyroidism).  A buildup of uric acid crystals, similar to gout in your joints.  Narrowing (stricture) of the ureter.  A kidney obstruction present at birth (congenital obstruction).  Previous surgery on the kidney or ureters.  Numerous kidney infections. SYMPTOMS   Feeling sick to your stomach  (nauseous).  Throwing up (vomiting).  Blood in the urine (hematuria).  Pain that usually spreads (radiates) to the groin.  Frequency or urgency of urination. DIAGNOSIS   Taking a history and physical exam.  Blood or urine tests.  CT scan.  Occasionally, an examination of the inside of the urinary bladder (cystoscopy) is performed. TREATMENT   Observation.  Increasing your fluid intake.  Extracorporeal shock wave lithotripsy--This is a noninvasive procedure that uses shock waves to break up kidney stones.  Surgery may be needed if you have severe pain or persistent obstruction. There are various surgical procedures. Most of the procedures are performed with the use of small instruments. Only small incisions are needed to accommodate these instruments, so recovery time is minimized. The size, location, and chemical composition are all important variables that will determine the proper choice of action for you. Talk to your health care provider to better understand your situation so that you will minimize the risk of injury to yourself and your kidney.  HOME CARE INSTRUCTIONS   Drink enough water and fluids to keep your urine clear or pale yellow. This will help you to pass the stone or stone fragments.  Strain all urine through the provided strainer. Keep all particulate matter and stones for your health care provider to see. The stone causing the pain may be as small as a grain of salt. It is very important to use the strainer each and every time you pass your urine. The collection of your stone will  allow your health care provider to analyze it and verify that a stone has actually passed. The stone analysis will often identify what you can do to reduce the incidence of recurrences.  Only take over-the-counter or prescription medicines for pain, discomfort, or fever as directed by your health care provider.  Keep all follow-up visits as told by your health care provider. This is  important.  Get follow-up X-rays if required. The absence of pain does not always mean that the stone has passed. It may have only stopped moving. If the urine remains completely obstructed, it can cause loss of kidney function or even complete destruction of the kidney. It is your responsibility to make sure X-rays and follow-ups are completed. Ultrasounds of the kidney can show blockages and the status of the kidney. Ultrasounds are not associated with any radiation and can be performed easily in a matter of minutes.  Make changes to your daily diet as told by your health care provider. You may be told to:  Limit the amount of salt that you eat.  Eat 5 or more servings of fruits and vegetables each day.  Limit the amount of meat, poultry, fish, and eggs that you eat.  Collect a 24-hour urine sample as told by your health care provider.You may need to collect another urine sample every 6-12 months. SEEK MEDICAL CARE IF:  You experience pain that is progressive and unresponsive to any pain medicine you have been prescribed. SEEK IMMEDIATE MEDICAL CARE IF:   Pain cannot be controlled with the prescribed medicine.  You have a fever or shaking chills.  The severity or intensity of pain increases over 18 hours and is not relieved by pain medicine.  You develop a new onset of abdominal pain.  You feel faint or pass out.  You are unable to urinate.   This information is not intended to replace advice given to you by your health care provider. Make sure you discuss any questions you have with your health care provider.   Document Released: 12/06/2005 Document Revised: 08/27/2015 Document Reviewed: 05/09/2013 Elsevier Interactive Patient Education [employer]. Cystoscopy Cystoscopy is a procedure that is used to help your caregiver diagnose and sometimes treat conditions that affect your lower urinary tract. Your lower urinary tract includes your bladder and the tube through which  urine passes from your bladder out of your body (urethra). Cystoscopy is performed with a thin, tube-shaped instrument (cystoscope). The cystoscope has lenses and a light at the end so that your caregiver can see inside your bladder. The cystoscope is inserted at the entrance of your urethra. Your caregiver guides it through your urethra and into your bladder. There are two main types of cystoscopy:  Flexible cystoscopy (with a flexible cystoscope).  Rigid cystoscopy (with a rigid cystoscope). Cystoscopy may be recommended for many conditions, including:  Urinary tract infections.  Blood in your urine (hematuria).  Loss of bladder control (urinary incontinence) or overactive bladder.  Unusual cells found in a urine sample.  Urinary blockage.  Painful urination. Cystoscopy may also be done to remove a sample of your tissue to be checked under a microscope (biopsy). It may also be done to remove or destroy bladder stones. LET YOUR CAREGIVER KNOW ABOUT:  Allergies to food or medicine.  Medicines taken, including vitamins, herbs, eyedrops, over-the-counter medicines, and creams.  Use of steroids (by mouth or creams).  Previous problems with anesthetics or numbing medicines.  History of bleeding problems or blood clots.  Previous surgery.  Other health problems, including diabetes and kidney problems.  Possibility of pregnancy, if this applies. PROCEDURE The area around the opening to your urethra will be cleaned. A medicine to numb your urethra (local anesthetic) is used. If a tissue sample or stone is removed during the procedure, you may be given a medicine to make you sleep (general anesthetic). Your caregiver will gently insert the tip of the cystoscope into your urethra. The cystoscope will be slowly glided through your urethra and into your bladder. Sterile fluid will flow through the cystoscope and into your bladder. The fluid will expand and stretch your bladder. This gives  your caregiver a better view of your bladder walls. The procedure lasts about 15-20 minutes. AFTER THE PROCEDURE If a local anesthetic is used, you will be allowed to go home as soon as you are ready. If a general anesthetic is used, you will be taken to a recovery area until you are stable. You may have temporary bleeding and burning on urination.   This information is not intended to replace advice given to you by your health care provider. Make sure you discuss any questions you have with your health care provider.   Document Released: 12/03/2000 Document Revised: 12/27/2014 Document Reviewed: 05/29/2012 Elsevier Interactive Patient Education 2016 Elsevier [employer] PATIENT INSTRUCTIONS POST-ANESTHESIA  IMMEDIATELY FOLLOWING SURGERY:  Do not drive or operate machinery for the first twenty four hours after surgery.  Do not make any important decisions for twenty four hours after surgery or while taking narcotic pain medications or sedatives.  If you develop intractable nausea and vomiting or a severe headache please notify your doctor immediately.  FOLLOW-UP:  Please make an appointment with your surgeon as instructed. You do not need to follow up with anesthesia unless specifically instructed to do so.  WOUND CARE INSTRUCTIONS (if applicable):  Keep a dry clean dressing on the anesthesia/puncture wound site if there is drainage.  Once the wound has quit draining you may leave it open to air.  Generally you should leave the bandage intact for twenty four hours unless there is drainage.  If the epidural site drains for more than 36-48 hours please call the anesthesia department.  QUESTIONS?:  Please feel free to call your physician or the hospital operator if you have any questions, and they will be happy to assist you.

## 2016-01-23 ENCOUNTER — Encounter (HOSPITAL_COMMUNITY): Payer: Self-pay

## 2016-01-23 ENCOUNTER — Encounter (HOSPITAL_COMMUNITY)
Admission: RE | Admit: 2016-01-23 | Discharge: 2016-01-23 | Disposition: A | Discharge status: Home / Self Care | Payer: Federal, State, Local not specified - PPO | Source: Ambulatory Visit | Attending: Urology | Admitting: Urology

## 2016-01-23 DIAGNOSIS — Z01812 Encounter for preprocedural laboratory examination: Secondary | ICD-10-CM | POA: Insufficient documentation

## 2016-01-23 DIAGNOSIS — N200 Calculus of kidney: Secondary | ICD-10-CM | POA: Insufficient documentation

## 2016-01-23 LAB — BASIC METABOLIC PANEL
Anion gap: 9 (ref 5–15)
BUN: 11 mg/dL (ref 6–20)
CO2: 24 mmol/L (ref 22–32)
Calcium: 9.1 mg/dL (ref 8.9–10.3)
Chloride: 108 mmol/L (ref 101–111)
Creatinine, Ser: 1.16 mg/dL (ref 0.61–1.24)
GFR calc Af Amer: >60 mL/min (ref >60)
GFR calc non Af Amer: >60 mL/min (ref >60)
Glucose, Bld: 107 mg/dL — ABNORMAL HIGH (ref 65–99)
Potassium: 3.8 mmol/L (ref 3.5–5.1)
Sodium: 141 mmol/L (ref 135–145)

## 2016-01-23 LAB — CBC WITH DIFFERENTIAL/PLATELET
Basophils Absolute: 0 10*3/uL (ref 0.0–0.1)
Basophils Relative: 0 %
Eosinophils Absolute: 0.4 10*3/uL (ref 0.0–0.7)
Eosinophils Relative: 5 %
HCT: 43.5 % (ref 39.0–52.0)
Hemoglobin: 14 g/dL (ref 13.0–17.0)
Lymphocytes Relative: 16 %
Lymphs Abs: 1.4 10*3/uL (ref 0.7–4.0)
MCH: 29.1 pg (ref 26.0–34.0)
MCHC: 32.2 g/dL (ref 30.0–36.0)
MCV: 90.4 fL (ref 78.0–100.0)
Monocytes Absolute: 0.7 10*3/uL (ref 0.1–1.0)
Monocytes Relative: 8 %
Neutro Abs: 6.2 10*3/uL (ref 1.7–7.7)
Neutrophils Relative %: 71 %
Platelets: 335 10*3/uL (ref 150–400)
RBC: 4.81 MIL/uL (ref 4.22–5.81)
RDW: 14.8 % (ref 11.5–15.5)
WBC: 8.7 10*3/uL (ref 4.0–10.5)

## 2016-01-27 ENCOUNTER — Ambulatory Visit (HOSPITAL_COMMUNITY): Payer: Federal, State, Local not specified - PPO

## 2016-01-27 ENCOUNTER — Ambulatory Visit (HOSPITAL_COMMUNITY): Payer: Federal, State, Local not specified - PPO | Admitting: Anesthesiology

## 2016-01-27 ENCOUNTER — Encounter (HOSPITAL_COMMUNITY)
Admission: RE | Disposition: A | Discharge status: Home / Self Care | Payer: Self-pay | Source: Ambulatory Visit | Attending: Urology

## 2016-01-27 ENCOUNTER — Encounter (HOSPITAL_COMMUNITY): Payer: Self-pay | Admitting: *Deleted

## 2016-01-27 ENCOUNTER — Ambulatory Visit (HOSPITAL_COMMUNITY)
Admission: RE | Admit: 2016-01-27 | Discharge: 2016-01-27 | Disposition: A | Discharge status: Home / Self Care | Payer: Federal, State, Local not specified - PPO | Source: Ambulatory Visit | Attending: Urology | Admitting: Urology

## 2016-01-27 DIAGNOSIS — E785 Hyperlipidemia, unspecified: Secondary | ICD-10-CM | POA: Insufficient documentation

## 2016-01-27 DIAGNOSIS — I10 Essential (primary) hypertension: Secondary | ICD-10-CM | POA: Diagnosis not present

## 2016-01-27 DIAGNOSIS — K219 Gastro-esophageal reflux disease without esophagitis: Secondary | ICD-10-CM | POA: Insufficient documentation

## 2016-01-27 DIAGNOSIS — N401 Enlarged prostate with lower urinary tract symptoms: Secondary | ICD-10-CM | POA: Diagnosis not present

## 2016-01-27 DIAGNOSIS — Z79899 Other long term (current) drug therapy: Secondary | ICD-10-CM | POA: Insufficient documentation

## 2016-01-27 DIAGNOSIS — M1991 Primary osteoarthritis, unspecified site: Secondary | ICD-10-CM | POA: Diagnosis not present

## 2016-01-27 DIAGNOSIS — Z87891 Personal history of nicotine dependence: Secondary | ICD-10-CM | POA: Insufficient documentation

## 2016-01-27 DIAGNOSIS — F4310 Post-traumatic stress disorder, unspecified: Secondary | ICD-10-CM | POA: Diagnosis not present

## 2016-01-27 DIAGNOSIS — N201 Calculus of ureter: Secondary | ICD-10-CM | POA: Insufficient documentation

## 2016-01-27 DIAGNOSIS — F329 Major depressive disorder, single episode, unspecified: Secondary | ICD-10-CM | POA: Diagnosis not present

## 2016-01-27 DIAGNOSIS — I2510 Atherosclerotic heart disease of native coronary artery without angina pectoris: Secondary | ICD-10-CM | POA: Insufficient documentation

## 2016-01-27 HISTORY — PX: HOLMIUM LASER APPLICATION: SHX5852

## 2016-01-27 HISTORY — PX: CYSTOSCOPY WITH STENT PLACEMENT: SHX5790

## 2016-01-27 HISTORY — PX: CYSTOSCOPY/RETROGRADE/URETEROSCOPY/STONE EXTRACTION WITH BASKET: SHX5317

## 2016-01-27 SURGERY — CYSTOSCOPY, WITH CALCULUS REMOVAL USING BASKET
Anesthesia: General | Site: Urethra | Laterality: Right

## 2016-01-27 MED ORDER — LIDOCAINE HCL (CARDIAC) 20 MG/ML IV SOLN
INTRAVENOUS | Status: DC | PRN
  Administered 2016-01-27: 50 mg via INTRAVENOUS

## 2016-01-27 MED ORDER — FENTANYL CITRATE (PF) 100 MCG/2ML IJ SOLN
INTRAMUSCULAR | Status: AC
  Filled 2016-01-27: qty 2

## 2016-01-27 MED ORDER — IOHEXOL 350 MG/ML SOLN
INTRAVENOUS | Status: DC | PRN
  Administered 2016-01-27: 10 mL via URETHRAL

## 2016-01-27 MED ORDER — STERILE WATER FOR IRRIGATION IR SOLN
Status: DC | PRN
  Administered 2016-01-27: 1000 mL

## 2016-01-27 MED ORDER — PROPOFOL 10 MG/ML IV BOLUS
INTRAVENOUS | Status: AC
  Filled 2016-01-27: qty 40

## 2016-01-27 MED ORDER — LACTATED RINGERS IV SOLN
INTRAVENOUS | Status: DC
  Administered 2016-01-27: 09:00:00 via INTRAVENOUS

## 2016-01-27 MED ORDER — MIDAZOLAM HCL 2 MG/2ML IJ SOLN
1.0000 mg | INTRAMUSCULAR | Status: DC | PRN
  Administered 2016-01-27: 2 mg via INTRAVENOUS

## 2016-01-27 MED ORDER — CEPHALEXIN 250 MG PO CAPS: 250.0000 mg | ORAL_CAPSULE | Freq: Two times a day (BID) | ORAL | Status: DC

## 2016-01-27 MED ORDER — SODIUM CHLORIDE 0.9 % IJ SOLN
INTRAMUSCULAR | Status: AC
  Filled 2016-01-27: qty 10

## 2016-01-27 MED ORDER — EPHEDRINE SULFATE 50 MG/ML IJ SOLN
INTRAMUSCULAR | Status: AC
  Filled 2016-01-27: qty 2

## 2016-01-27 MED ORDER — PHENYLEPHRINE 40 MCG/ML (10ML) SYRINGE FOR IV PUSH (FOR BLOOD PRESSURE SUPPORT)
PREFILLED_SYRINGE | INTRAVENOUS | Status: AC
  Filled 2016-01-27: qty 10

## 2016-01-27 MED ORDER — FENTANYL CITRATE (PF) 100 MCG/2ML IJ SOLN
INTRAMUSCULAR | Status: DC | PRN
  Administered 2016-01-27 (×4): 25 ug via INTRAVENOUS

## 2016-01-27 MED ORDER — PROPOFOL 10 MG/ML IV BOLUS
INTRAVENOUS | Status: DC | PRN
  Administered 2016-01-27: 150 mg via INTRAVENOUS
  Administered 2016-01-27 (×2): 50 mg via INTRAVENOUS

## 2016-01-27 MED ORDER — FENTANYL CITRATE (PF) 100 MCG/2ML IJ SOLN
25.0000 ug | INTRAMUSCULAR | Status: AC
  Administered 2016-01-27: 25 ug via INTRAVENOUS

## 2016-01-27 MED ORDER — ARTIFICIAL TEARS OP OINT
TOPICAL_OINTMENT | OPHTHALMIC | Status: AC
  Filled 2016-01-27: qty 3.5

## 2016-01-27 MED ORDER — ONDANSETRON HCL 4 MG/2ML IJ SOLN: 4.0000 mg | Freq: Once | INTRAMUSCULAR | Status: DC | PRN

## 2016-01-27 MED ORDER — SODIUM CHLORIDE 0.9 % IR SOLN
Status: DC | PRN
  Administered 2016-01-27 (×2): 3000 mL

## 2016-01-27 MED ORDER — CEFAZOLIN SODIUM 1-5 GM-% IV SOLN
INTRAVENOUS | Status: AC
  Filled 2016-01-27: qty 50

## 2016-01-27 MED ORDER — ONDANSETRON HCL 4 MG/2ML IJ SOLN
4.0000 mg | Freq: Once | INTRAMUSCULAR | Status: AC
  Administered 2016-01-27: 4 mg via INTRAVENOUS

## 2016-01-27 MED ORDER — ONDANSETRON HCL 4 MG/2ML IJ SOLN
INTRAMUSCULAR | Status: AC
  Filled 2016-01-27: qty 2

## 2016-01-27 MED ORDER — MIDAZOLAM HCL 2 MG/2ML IJ SOLN
INTRAMUSCULAR | Status: AC
  Filled 2016-01-27: qty 2

## 2016-01-27 MED ORDER — FENTANYL CITRATE (PF) 100 MCG/2ML IJ SOLN: 25.0000 ug | INTRAMUSCULAR | Status: DC | PRN

## 2016-01-27 MED ORDER — CEFAZOLIN SODIUM 1-5 GM-% IV SOLN
1.0000 g | INTRAVENOUS | Status: AC
  Administered 2016-01-27: 1 g via INTRAVENOUS

## 2016-01-27 MED ORDER — OXYBUTYNIN CHLORIDE 5 MG PO TABS: 5.0000 mg | ORAL_TABLET | Freq: Three times a day (TID) | ORAL | Status: DC | PRN

## 2016-01-27 SURGICAL SUPPLY — 31 items
BAG DRAIN URO TABLE W/ADPT NS (DRAPE) ×3 IMPLANT
BAG DRN 8 ADPR NS SKTRN CSTL (DRAPE) ×1
BAG HAMPER (MISCELLANEOUS) ×3 IMPLANT
CATH INTERMIT  6FR 70CM (CATHETERS) ×3 IMPLANT
CATH OPEN TIP 6FR (CATHETERS) ×3 IMPLANT
CATH URET 5FR 28IN CONE TIP (BALLOONS)
CATH URET 5FR 28IN OPEN ENDED (CATHETERS) IMPLANT
CATH URET 5FR 70CM CONE TIP (BALLOONS) IMPLANT
CLOTH BEACON ORANGE TIMEOUT ST (SAFETY) ×3 IMPLANT
DRAPE CAMERA CLOSED 9X96 (DRAPES) ×3 IMPLANT
EXTRACTOR STONE NITINOL NGAGE (UROLOGICAL SUPPLIES) ×3 IMPLANT
GLOVE BIOGEL M 8.0 STRL (GLOVE) ×3 IMPLANT
GLOVE BIOGEL PI IND STRL 7.0 (GLOVE) IMPLANT
GLOVE BIOGEL PI INDICATOR 7.0 (GLOVE) ×4
GLOVE ECLIPSE 6.5 STRL STRAW (GLOVE) ×4 IMPLANT
GOWN PREVENTION PLUS LG XLONG (DISPOSABLE) ×3 IMPLANT
GOWN STRL REUS W/ TWL LRG LVL3 (GOWN DISPOSABLE) ×1 IMPLANT
GOWN STRL REUS W/TWL LRG LVL3 (GOWN DISPOSABLE) ×3
GOWN STRL REUS W/TWL XL LVL3 (GOWN DISPOSABLE) ×3 IMPLANT
GUIDEWIRE ANG ZIPWIRE 038X150 (WIRE) IMPLANT
GUIDEWIRE STR DUAL SENSOR (WIRE) IMPLANT
IV NS IRRIG 3000ML ARTHROMATIC (IV SOLUTION) ×5 IMPLANT
KIT ROOM TURNOVER AP CYSTO (KITS) ×3 IMPLANT
LASER FIBER DISP (UROLOGICAL SUPPLIES) ×3 IMPLANT
NS IRRIG 500ML POUR BTL (IV SOLUTION) ×2 IMPLANT
PACK CYSTO (CUSTOM PROCEDURE TRAY) ×3 IMPLANT
PAD ARMBOARD 7.5X6 YLW CONV (MISCELLANEOUS) ×3 IMPLANT
SHEATH ACCESS URETERAL 38CM (SHEATH) ×2 IMPLANT
STENT URET 6FRX26 CONTOUR (STENTS) ×2 IMPLANT
SYRINGE IRR TOOMEY STRL 70CC (SYRINGE) ×2 IMPLANT
TOWEL OR 17X26 4PK STRL BLUE (TOWEL DISPOSABLE) ×3 IMPLANT

## 2016-01-27 NOTE — Anesthesia Preprocedure Evaluation (Signed)
Anesthesia Evaluation  Patient identified by MRN, date of birth, ID band Patient awake    Reviewed: Allergy & Precautions, NPO status , Patient's Chart, lab work & pertinent test results  Airway Mallampati: II  TM Distance: >3 FB     Dental  (+) Partial Lower, Partial Upper, Dental Advisory Given   Pulmonary asthma , sleep apnea and Continuous Positive Airway Pressure Ventilation , former smoker,    breath sounds clear to auscultation       Cardiovascular hypertension, Pt. on medications + CAD   Rhythm:Regular Rate:Normal     Neuro/Psych PSYCHIATRIC DISORDERS (PTSD) Depression Deceased memory issues    GI/Hepatic   Endo/Other    Renal/GU      Musculoskeletal   Abdominal   Peds  Hematology   Anesthesia Other Findings   Reproductive/Obstetrics                             Anesthesia Physical Anesthesia Plan  ASA: III  Anesthesia Plan: General   Post-op Pain Management:    Induction: Intravenous  Airway Management Planned: LMA  Additional Equipment:   Intra-op Plan:   Post-operative Plan: Extubation in OR  Informed Consent: I have reviewed the patients History and Physical, chart, labs and discussed the procedure including the risks, benefits and alternatives for the proposed anesthesia with the patient or authorized representative who has indicated his/her understanding and acceptance.     Plan Discussed with:   Anesthesia Plan Comments:         Anesthesia Quick Evaluation

## 2016-01-27 NOTE — Transfer of Care (Signed)
Immediate Anesthesia Transfer of Care Note  Patient: TRINIDY LORIG  Procedure(s) Performed: Procedure(s): CYSTOSCOPY, RIGHT RETROGRADE, RIGHT URETEROSCOPY, STONE EXTRACTION WITH BASKET (Right) HOLMIUM LASER APPLICATION (Right) CYSTOSCOPY WITH STENT PLACEMENT (Right)  Patient Location: PACU  Anesthesia Type:General  Level of Consciousness: awake, alert , oriented and patient cooperative  Airway & Oxygen Therapy: Patient Spontanous Breathing and Patient connected to face mask oxygen  Post-op Assessment: Report given to RN and Post -op Vital signs reviewed and stable  Post vital signs: Reviewed and stable  Last Vitals:  Filed Vitals:   01/27/16 0950 01/27/16 0955  BP: 122/81 117/79  Resp: 17 14    Complications: No apparent anesthesia complications

## 2016-01-27 NOTE — Anesthesia Postprocedure Evaluation (Signed)
Anesthesia Post Note  Patient: Cody Velez  Procedure(s) Performed: Procedure(s) (LRB): CYSTOSCOPY, RIGHT RETROGRADE, RIGHT URETEROSCOPY, STONE EXTRACTION WITH BASKET (Right) HOLMIUM LASER APPLICATION (Right) CYSTOSCOPY WITH STENT PLACEMENT (Right)  Patient location during evaluation: PACU Anesthesia Type: General Level of consciousness: awake and alert and oriented Pain management: pain level controlled Vital Signs Assessment: post-procedure vital signs reviewed and stable Respiratory status: spontaneous breathing and respiratory function stable Cardiovascular status: stable Postop Assessment: no signs of nausea or vomiting Anesthetic complications: no    Last Vitals:  Filed Vitals:   01/27/16 0955 01/27/16 1048  BP: 117/79   Temp:  36.7 C  Resp: 14     Last Pain: There were no vitals filed for this visit.               ADAMS, AMY A

## 2016-01-27 NOTE — Discharge Instructions (Signed)
[facility] [phone] Post Ureteroscopy With or Without Stent Instructions  Definitions:  Ureter: The duct that transports urine from the kidney to the bladder. Stent:   A plastic hollow tube that is placed into the ureter, from the kidney to the bladder to prevent the ureter from swelling shut.  GENERAL INSTRUCTIONS:  Despite the fact that no skin incisions were used, the area around the ureter and bladder is raw and irritated. The stent is a foreign body which will further irritate the bladder wall. This irritation is manifested by increased frequency of urination, both day and night, and by an increase in the urge to urinate. In some, the urge to urinate is present almost always. Sometimes the urge is strong enough that you may not be able to stop yourself from urinating. The only real cure is to remove the stent and then give time for the bladder wall to heal which can't be done until the danger of the ureter swelling shut has passed, which varies. It is okay to pull the string to remove the stent on Friday morning.  You may see some blood in your urine while the stent is in place and a few days afterwards. Do not be alarmed, even if the urine was clear for a while. Get off your feet and drink lots of fluids until clearing occurs. If you start to pass clots or don't improve, call us.  DIET: You may return to your normal diet immediately. Because of the raw surface of your bladder, alcohol, spicy foods, acid type foods and drinks with caffeine may cause irritation or frequency and should be used in moderation. To keep your urine flowing freely and to avoid constipation, drink plenty of fluids during the day ( 8-10 glasses ). Tip: Avoid cranberry juice because it is very acidic.  ACTIVITY: Your physical activity doesn't need to be restricted. However, if you are very active, you may see some blood in your urine. We suggest that you reduce your activity under these  circumstances until the bleeding has stopped.  BOWELS: It is important to keep your bowels regular during the postoperative period. Straining with bowel movements can cause bleeding. A bowel movement every other day is reasonable. Use a mild laxative if needed, such as Milk of Magnesia 2-3 tablespoons, or 2 Dulcolax tablets. Call if you continue to have problems. If you have been taking narcotics for pain, before, during or after your surgery, you may be constipated. Take a laxative if necessary.   MEDICATION: You should resume your pre-surgery medications unless told not to. In addition you will often be given an antibiotic to prevent infection. These should be taken as prescribed until the bottles are finished unless you are having an unusual reaction to one of the drugs.  PROBLEMS YOU SHOULD REPORT TO [place]:  Fevers over 100.5 Fahrenheit.  Heavy bleeding, or clots ( See above notes about blood in urine ).  Inability to urinate.  Drug reactions ( hives, rash, nausea, vomiting, diarrhea ).  Severe burning or pain with urination that is not improving.  FOLLOW-UP: You will need a follow-up appointment to monitor your progress. Call for this appointment at the number listed above. Usually the first appointment will be about three to fourteen days after your surgery.   PATIENT INSTRUCTIONS POST-ANESTHESIA  IMMEDIATELY FOLLOWING SURGERY:  Do not drive or operate machinery for the first twenty four hours after surgery.  Do not make any important decisions for twenty four hours after surgery or  while taking narcotic pain medications or sedatives.  If you develop intractable nausea and vomiting or a severe headache please notify your doctor immediately.  FOLLOW-UP:  Please make an appointment with your surgeon as instructed. You do not need to follow up with anesthesia unless specifically instructed to do so.  WOUND CARE INSTRUCTIONS (if applicable):  Keep a dry clean dressing on the  anesthesia/puncture wound site if there is drainage.  Once the wound has quit draining you may leave it open to air.  Generally you should leave the bandage intact for twenty four hours unless there is drainage.  If the epidural site drains for more than 36-48 hours please call the anesthesia department.  QUESTIONS?:  Please feel free to call your physician or the hospital operator if you have any questions, and they will be happy to assist you.

## 2016-01-27 NOTE — Op Note (Signed)
PATIENT:  Cody Velez  PRE-OPERATIVE DIAGNOSIS: 7 mm right distal ureteral calculus  POST-OPERATIVE DIAGNOSIS: Same  PROCEDURE: Cystoscopy, right retrograde ureteropyelogram, interpretive fluoroscopy, right ureteroscopy, holmium laser lithotripsy of right distal ureteral stone, right ureteral stone extraction, placement of 6 x 26 cm contour double-J stent with string  SURGEON:  Lillette Boxer. Shereda Graw, M.D.  ANESTHESIA:  General  EBL:  Minimal  DRAINS: previously mentioned stentLOCAL MEDICATIONS USED:  None  SPECIMEN:   Stone fragments, to the patient's wife  INDICATION: Cody Velez is a 70 year old male with a symptomatic 7 mm right distal ureteral stone. He presents at this time for ureteroscopic stone management, with holmium laser lithotripsy. Risks and complications of the procedure have been discussed with the patient and his wife, who desire to proceed.   Description of procedure: The patient was properly identified and marked (if applicable) in the holding area. They were then  taken to the operating room and placed on the table in a supine position. General anesthesia was then administered. Once fully anesthetized the patient was moved to the dorsolithotomy position and the genitalia and perineum were sterilely prepped and draped in standard fashion. An official timeout was then performed.   A 23 French panendoscope was advanced to the patient's urethra. Urethra was free of lesions or stricture. Prostate was nonobstructive. His bladder was entered and inspected circumferentially. Both ureteral orifices were normal in configuration and location. There were no tumors, foreign bodies or trabeculations. The right ureteral orifice was cannulated with a 6 [place] open-ended catheter.  Omnipaque was utilized to perform a retrograde ureteropyelogram. This revealed a filling defect in the right distal ureter consistent with the previously mentioned stone. There was mild columnization  of contrast proximal to this. There was mild pyelocaliectasis. I saw no other abnormalities of the collecting system or ureter.  At this point, a 0.038 inch sensor-tip guidewire was introduced through the open-ended catheter and advanced into the upper pole calyceal system using fluoroscopic guidance. I then removed the cystoscope and the open-ended catheter, leaving the guidewire in place. I then dilated, sequentially, the distal ureter first with the inner core and then the entire 12/14 ureteral access catheter. The catheter was then removed. A 6 French semirigid ureteroscope was advanced through the urethra and into the right distal ureter. The stone was encountered approximately 3 cm up the ureter. The distal ureter was somewhat narrowed, and I thought eventually the patient would need a stent placed following stone management. The stone was grasped with the engage basket, but could not be extracted through the distal ureter. I then fragmented the stone with a 360  laser fiber with the energy said it 5 J, and a frequency of 10 Hz. The stone was then easily fragmented and 5 or 6 smaller pieces which were then extracted with the engage basket quite easily and deposited in the bladder. The laser fiber was then removed. The ureteroscope was advanced all the way to the proximal ureter, no further stones were seen. The ureteroscope was then removed. Cystoscopically, after the guidewire was acclimated through the cystoscope, a 6 [place] by 26 cm contour double-J stent was placed, with the tether on. Small stone fragments were then irrigated from the bladder for specimen. The bladder was then drained. The string was brought out through the urethra and taped to the patient's penis.  The procedure was then terminated. The patient was awakened and taken to the PACU in stable condition. He tolerated the procedure well.  PLAN OF CARE: Discharge to home after PACU  PATIENT DISPOSITION:  PACU - hemodynamically  stable.

## 2016-01-27 NOTE — H&P (Signed)
Urology History and Physical Exam  CC: Right-sided kidney stone  [id number] 70 year old male presents for ureteroscopic management of a newly diagnosed 7 mm right distal ureteral calculus. He has been seen for several GU issues as outlined below. He came in last week with a five-day history of lower back discomfort, lower abdominal discomfort, gross hematuria, mild urinary frequency and urgency.  No fever, no chills.  No nausea or vomiting.  Symptoms get a little bit better with the bowel movement.  He denies prior history of kidney stones. Stone protocol revealed the distal stone as well as bilateral renal calculi.  ADENOCARCINOMA OF THE PROSTATE   He underwent ultrasound and biopsy of his prostate on 03/11/2015.  At that time, PSA was 4.1.  Prostatic volume was 65 cc.  PSA density 0.06.  2/12 cores, both from the left apex, revealed Gleason 3+3 equal 6 adenocarcinoma.  One core at 5% involved, one 10%.  Because of his low volume, low-grade disease, he elected to proceed with active surveillance.  MRI of the prostate was performed on 09/03/2015.  This revealed no evidence of findings associated with high-grade disease.  Mildly suspicious findings in the right lateral apex and cyst with possible low-grade disease.  Seminal vesicle/capsular involvement/nodal involvement not present.  He has not had repeat biopsy yet.  ERECTILE DYSFUNCTION, ORGANIC  He is treated for erectile dysfunction with PDE 5 inhibitors.  These are not working well for him at the present time.  BPH with VOIDING SYMPTOMS  He is now on finasteride for BPH and voiding.  He does find it when he is constipated, which happens frequently, his stream slows down.  He is on narcotics for back and neck pain.  Interval history:  This man comes in last week with a five-day history of lower back discomfort, lower abdominal discomfort, gross hematuria, mild urinary frequency and urgency.  No fever, no chills.  No nausea or vomiting.   Symptoms get a little bit better with the bowel movement.  He denies prior history of kidney stones.    PMH: Past Medical History  Diagnosis Date  . Coronary atherosclerosis of native coronary artery     Mild nonobstructive CAD at catheterization January 2015  . Rectal bleeding   . Hyperlipidemia   . Allergic rhinitis   . Essential hypertension, benign   . Depression   . OSA (obstructive sleep apnea)     CPAP - Dr. [name]  . Asthma   . History of pneumonia 02/2011  . GERD (gastroesophageal reflux disease)   . Arthritis   . Chronic back pain   . PTSD (post-traumatic stress disorder)     [place]  . Memory difficulty 07/23/2015  . History of cerebrovascular disease 07/23/2015  . B12 deficiency   . Cancer [facility])     prostate    PSH: Past Surgical History  Procedure Laterality Date  . Bilateral knee arthroscopy    . Facial cosmetic surgery  ` 1985    "broke face playing softball"  . Fracture surgery      "left wrist; broke it; took spur off"  . Esophagogastroduodenoscopy  10/15/2008      Dr Rourk:Schatzki's ring status post dilation and disruption via 59 F Maloney dilator/ otherwise unremarkable esophagus, small hiatal hernia, multiple fundal gland polyps not manipulated, gastritis, negative H.pylori  . Colonoscopy  10/15/2008    Dr. [name]: tubular adenoma   . Colonoscopy  12/17/2003    [id number] rectal and colon  . Esophagogastroduodenoscopy  06/21/02  [id number] sliding hiatal hernia with mild changes of reflux esophagitis limited to gastroesophageal junction.  Noncritical ring at distal esophagus, 3 cm proximal to gastroesophageal junction/Antral gastritis  . Neck epidural    . Colonoscopy N/A 09/05/2014    Procedure: COLONOSCOPY;  Surgeon: Daneil Dolin, MD;  Location: [facility];  Service: Endoscopy;  Laterality: N/A;  7:30-rescheduled 9/17 to [place] notified pt  . Left heart catheterization with coronary angiogram N/A 12/27/2013    Procedure: LEFT HEART  CATHETERIZATION WITH CORONARY ANGIOGRAM;  Surgeon: [name], MD;  Location: [facility] CATH LAB;  Service: Cardiovascular;  Laterality: N/A;  . Shoulder surgery Bilateral   . Back surgery  02/14/12    lumbar OR #7; "today redid L1L2; replaced screws; added bone from hip"    Allergies: No Known Allergies  Medications: No prescriptions prior to admission     Social History: Social History   Social History  . Marital Status: Widowed    Spouse Name: N/A  . Number of Children: 3  . Years of Education: N/A   Occupational History  . [employer]    Social History Main Topics  . Smoking status: Former Smoker    Types: Cigarettes    Quit date: 12/20/1958  . Smokeless tobacco: Current User    Types: Chew     Comment: Quit chewing 04/2015  . Alcohol Use: 0.0 oz/week    0 Standard drinks or equivalent per week     Comment: wine socially  . Drug Use: No     Comment: "last used marijuana ~ 1969"  . Sexual Activity: No   Other Topics Concern  . 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      Patient drinks about 1 cup of coffee daily.   Patient is left handed.    Family History: Family History  Problem Relation Age of Onset  . Emphysema Father   . Heart failure Father   . Lung cancer Father   . CAD Father   . Emphysema Maternal Grandmother   . Stroke Maternal Grandmother   . Asthma Other     grandson  . Heart disease Paternal Grandfather   . Colon cancer Mother   . Stroke Mother   . Breast cancer Mother   . Stroke Sister   . Anesthesia problems Neg Hx   . Hypotension Neg Hx   . Malignant hyperthermia Neg Hx   . Pseudochol deficiency Neg Hx   . Heart attack Brother   . Dementia Paternal Uncle     Review of Systems: Genitourinary: hematuria and erectile dysfunction.  Gastrointestinal: abdominal pain.  Musculoskeletal: back pain.                    Physical Exam: Constitutional: Well nourished and well  developed . No acute distress.  ENT:. The ears and nose are normal in appearance.  Neck: The appearance of the neck is normal.  Pulmonary: No respiratory distress and normal respiratory rhythm and effort.  Cardiovascular: Heart rate and rhythm are normal . No peripheral edema.  Abdomen: The abdomen is obese. The abdomen is soft and nontender. No masses are palpated. Mild tenderness in the RLQ is present and tenderness in the LLQ is present. No CVA tenderness. No hernias are palpable. No hepatosplenomegaly noted.  Rectal: Rectal exam demonstrates normal sphincter tone, no tenderness and no masses. Prostate size is estimated to be 50 g.  Normal rectal tone, no rectal masses, prostate is smooth, symmetric and non-tender. The prostate has no nodularity and is not tender. The left seminal vesicle is nonpalpable. The right seminal vesicle is nonpalpable. The perineum is normal on inspection.  Genitourinary: Examination of the penis demonstrates no discharge, no masses, no lesions and a normal meatus. The penis is circumcised. The scrotum is normal in appearance and without lesions. The right epididymis is palpably normal and non-tender. The left epididymis is palpably normal and non-tender. The right testis is palpably normal, non-tender and without masses. The left testis is normal, non-tender and without masses.  Skin: Normal skin turgor, no visible rash and no visible skin lesions.  Neuro/Psych:. Mood and affect are appropriate. Studies:  No results for input(s): HGB, WBC, PLT in the last 72 hours.  No results for input(s): NA, K, CL, CO2, BUN, CREATININE, CALCIUM, GFRNONAA, GFRAA in the last 72 hours.  Invalid input(s): MAGNESIUM   No results for input(s): INR, APTT in the last 72 hours.  Invalid input(s): PT   Invalid input(s): ABG    Assessment:  7 mm right distal ureteral stone  Plan: Cystoscopy, right retrograde ureteropyelogram, right ureteroscopy with holmium laser, extraction of  right ureteral stone, possible J2 stent placement.

## 2016-01-27 NOTE — Anesthesia Procedure Notes (Signed)
Procedure Name: LMA Insertion Date/Time: 01/27/2016 10:06 AM Performed by: Andree Elk, AMY A Pre-anesthesia Checklist: Patient identified, Timeout performed, Emergency Drugs available, Suction available and Patient being monitored Patient Re-evaluated:Patient Re-evaluated prior to inductionOxygen Delivery Method: Circle system utilized Preoxygenation: Pre-oxygenation with 100% oxygen Intubation Type: IV induction LMA: LMA inserted LMA Size: 5.0 Number of attempts: 1 Placement Confirmation: positive ETCO2 and breath sounds checked- equal and bilateral Tube secured with: Tape Dental Injury: Teeth and Oropharynx as per pre-operative assessment

## 2016-01-28 ENCOUNTER — Encounter: Payer: Self-pay | Admitting: Neurology

## 2016-01-28 ENCOUNTER — Ambulatory Visit (INDEPENDENT_AMBULATORY_CARE_PROVIDER_SITE_OTHER): Payer: Federal, State, Local not specified - PPO | Admitting: Neurology

## 2016-01-28 VITALS — BP 137/88 | HR 76 | Ht 75.0 in | Wt 293.5 lb

## 2016-01-28 DIAGNOSIS — R51 Headache: Secondary | ICD-10-CM

## 2016-01-28 DIAGNOSIS — E538 Deficiency of other specified B group vitamins: Secondary | ICD-10-CM | POA: Insufficient documentation

## 2016-01-28 DIAGNOSIS — G4486 Cervicogenic headache: Secondary | ICD-10-CM

## 2016-01-28 DIAGNOSIS — R413 Other amnesia: Secondary | ICD-10-CM | POA: Diagnosis not present

## 2016-01-28 HISTORY — DX: Cervicogenic headache: G44.86

## 2016-01-28 MED ORDER — DONEPEZIL HCL 10 MG PO TABS: 10.0000 mg | ORAL_TABLET | Freq: Every day | ORAL | Status: DC

## 2016-01-28 MED ORDER — TIZANIDINE HCL 2 MG PO TABS: 2.0000 mg | ORAL_TABLET | Freq: Three times a day (TID) | ORAL | Status: DC

## 2016-01-28 NOTE — Progress Notes (Signed)
Reason for visit:  Memory disturbance  Cody Velez is an 70 y.o. male  History of present illness:   Cody Velez is a 70 year old left-handed white male with a history of a progressive memory disturbance. The patient has a moderate level small vessel disease by MRI. He was also found to have a low B12 level, he was given a B12 injection, but he never went on oral supplementation as prescribed. The patient has recently had some issues with kidney stones. He also reports some neck discomfort and occipital headaches associated with this. The patient indicates that the headaches are every day from the time he wakes up to when he goes to bed. At times, the headache may become so severe that he has to lie down, this may happen once every 2 or 3 weeks. The patient is on low dose Aricept taking 5 mg at night. He is tolerating the medication well. He had lithotripsy for his kidney stone yesterday.  Past Medical History  Diagnosis Date  . Coronary atherosclerosis of native coronary artery     Mild nonobstructive CAD at catheterization January 2015  . Rectal bleeding   . Hyperlipidemia   . Allergic rhinitis   . Essential hypertension, benign   . Depression   . OSA (obstructive sleep apnea)     CPAP - Dr. [name]  . Asthma   . History of pneumonia 02/2011  . GERD (gastroesophageal reflux disease)   . Arthritis   . Chronic back pain   . PTSD (post-traumatic stress disorder)     [place]  . Memory difficulty 07/23/2015  . History of cerebrovascular disease 07/23/2015  . B12 deficiency   . Cancer [facility])     prostate  . Falls   . Kidney stone   . Cervicogenic headache 01/28/2016    Past Surgical History  Procedure Laterality Date  . Bilateral knee arthroscopy    . Facial cosmetic surgery  ` 1985    "broke face playing softball"  . Fracture surgery      "left wrist; broke it; took spur off"  . Esophagogastroduodenoscopy  10/15/2008      Dr Rourk:Schatzki's ring status post dilation and  disruption via 37 F Maloney dilator/ otherwise unremarkable esophagus, small hiatal hernia, multiple fundal gland polyps not manipulated, gastritis, negative H.pylori  . Colonoscopy  10/15/2008    Dr. [name]: tubular adenoma   . Colonoscopy  12/17/2003    [id number] rectal and colon  . Esophagogastroduodenoscopy  06/21/02    [id number] sliding hiatal hernia with mild changes of reflux esophagitis limited to gastroesophageal junction.  Noncritical ring at distal esophagus, 3 cm proximal to gastroesophageal junction/Antral gastritis  . Neck epidural    . Colonoscopy N/A 09/05/2014    Procedure: COLONOSCOPY;  Surgeon: Daneil Dolin, MD;  Location: [facility] ENDO SUITE;  Service: Endoscopy;  Laterality: N/A;  7:30-rescheduled 9/17 to [place] notified pt  . Left heart catheterization with coronary angiogram N/A 12/27/2013    Procedure: LEFT HEART CATHETERIZATION WITH CORONARY ANGIOGRAM;  Surgeon: [name], MD;  Location: [facility] LAB;  Service: Cardiovascular;  Laterality: N/A;  . Shoulder surgery Bilateral   . Back surgery  02/14/12    lumbar OR #7; "today redid L1L2; replaced screws; added bone from hip"  . Cystoscopy/retrograde/ureteroscopy/stone extraction with basket Right 01/27/2016    Procedure: CYSTOSCOPY, RIGHT RETROGRADE, RIGHT URETEROSCOPY, STONE EXTRACTION ;  Surgeon: [name], MD;  Location: [facility] ORS;  Service: Urology;  Laterality: Right;  .  Holmium laser application Right [id number]    Procedure: HOLMIUM LASER APPLICATION;  Surgeon: [name], MD;  Location: [facility] ORS;  Service: Urology;  Laterality: Right;  . Cystoscopy with stent placement Right 01/27/2016    Procedure: CYSTOSCOPY WITH STENT PLACEMENT;  Surgeon: [name], MD;  Location: [facility] ORS;  Service: Urology;  Laterality: Right;    Family History  Problem Relation Age of Onset  . Emphysema Father   . Heart failure Father   . Lung cancer Father   . CAD Father   . Emphysema Maternal Grandmother   . Stroke  Maternal Grandmother   . Asthma Other     grandson  . Heart disease Paternal Grandfather   . Colon cancer Mother   . Stroke Mother   . Breast cancer Mother   . Stroke Sister   . Anesthesia problems Neg Hx   . Hypotension Neg Hx   . Malignant hyperthermia Neg Hx   . Pseudochol deficiency Neg Hx   . Heart attack Brother   . Dementia Paternal Uncle     Social history:  reports that he quit smoking about 57 years ago. His smoking use included Cigarettes. His smokeless tobacco use includes Chew. He reports that he drinks alcohol. He reports that he does not use illicit drugs.   No Known Allergies  Medications:  Prior to Admission medications   Medication Sig Start Date End Date Taking? Authorizing Provider  amLODipine (NORVASC) 10 MG tablet Take 10 mg by mouth daily.   Yes Historical Provider, MD  buPROPion (WELLBUTRIN SR) 200 MG 12 hr tablet Take 200 mg by mouth 2 (two) times daily.    Yes Historical Provider, MD  cephALEXin (KEFLEX) 250 MG capsule Take 1 capsule (250 mg total) by mouth 2 (two) times daily. 01/27/16  Yes [name], MD  diazepam (VALIUM) 5 MG tablet Take 5 mg by mouth every 6 (six) hours as needed for muscle spasms.   Yes Historical Provider, MD  donepezil (ARICEPT) 5 MG tablet TAKE ONE TABLET BY MOUTH EVERY NIGHT AT BEDTIME 01/06/16  Yes Kathrynn Ducking, MD  finasteride (PROSCAR) 5 MG tablet Take 5 mg by mouth daily.   Yes Historical Provider, MD  Linaclotide (LINZESS) 290 MCG CAPS capsule Take 1 capsule (290 mcg total) by mouth daily. 08/13/14  Yes Mahala Menghini, PA-C  losartan (COZAAR) 100 MG tablet Take 100 mg by mouth every morning.    Yes Historical Provider, MD  methocarbamol (ROBAXIN) 500 MG tablet Take 500 mg by mouth every 6 (six) hours as needed for muscle spasms.   Yes Historical Provider, MD  omeprazole (PRILOSEC) 20 MG capsule Take 20 mg by mouth daily.   Yes Historical Provider, MD  oxybutynin (DITROPAN) 5 MG tablet Take 1 tablet (5 mg total) by mouth  every 8 (eight) hours as needed for bladder spasms. 01/27/16  Yes [name], MD  pravastatin (PRAVACHOL) 10 MG tablet Take 10 mg by mouth every evening.    Yes Historical Provider, MD  traZODone (DESYREL) 100 MG tablet Take 100 mg by mouth at bedtime.   Yes Historical Provider, MD  venlafaxine XR (EFFEXOR-XR) 150 MG 24 hr capsule Take 150 mg by mouth 2 (two) times daily.    Yes Historical Provider, MD    ROS:  Out of a complete 14 system review of symptoms, the patient complains only of the following symptoms, and all other reviewed systems are negative.   Decreased hearing, ringing in the ears, difficulty swallowing  Eye  discharge, eye itching, blurred vision  Sleep apnea, daytime sleepiness, snoring, sleep talking  Difficulty urinating, painful urination, incontinence of the bladder, frequency of urination, blood in urine, urinary urgency, testicular pain  Joint pain, joint swelling, back pain, achy muscles, walking difficulty, neck stiffness  Memory loss, headache  Skin rash, itching  Blood pressure 137/88, pulse 76, height 6\' 3"  (1.905 m), weight 293 lb 8 oz (133.131 kg).  Physical Exam  General: The patient is alert and cooperative at the time of the examination. The patient is moderately obese.   Skin:  1+ edema of ankles is noted bilaterally..   Neurologic Exam  Mental status: The patient is alert and oriented x 3 at the time of the examination. The patient has apparent normal recent and remote memory, with an apparently normal attention span and concentration ability. Mini-Mental status examination done today shows a total score 28/30. The patient is able to name 14 animal in 30 seconds.   Cranial nerves: Facial symmetry is present. Speech is normal, no aphasia or dysarthria is noted. Extraocular movements are full. Visual fields are full.  Motor: The patient has good strength in all 4 extremities.  Sensory examination: Soft touch sensation is symmetric on the face,  arms, and legs, with exception of some slight decrease in soft touch on the right leg relative to the left.  Coordination: The patient has good finger-nose-finger and heel-to-shin bilaterally.  Gait and station: The patient has a normal gait. Tandem gait is slightly unsteady. Romberg is negative. No drift is seen.  Reflexes: Deep tendon reflexes are symmetric, but are depressed.   Assessment/Plan:   1. Progressive memory disturbance   2. Gait disturbance   3. Cerebrovascular disease by MRI   4. Cervicogenic headache   5. Vitamin B12 deficiency   The patient is to go up on the Aricept dose taking 10 mg at night. He will be placed back on vitamin B12 supplementation, 1000 g daily. We will recheck blood work on his next visit. The patient will be placed on tizanidine for the headache taking 2 mg 3 times daily. He will follow-up in 6 months.   Jill Alexanders MD 01/28/2016 5:53 PM  [facility] [address] [place], [place] [address]  Phone [phone] Fax [phone]

## 2016-01-28 NOTE — Patient Instructions (Signed)
   We will get back on Vitamin B12 1000 mcg daily. We will go up on the Aricept (donepezil) to 10 mg at night for the memory. We will start tizanidine for the headache taking 2 mg three times a day.

## 2016-02-10 ENCOUNTER — Ambulatory Visit (HOSPITAL_COMMUNITY)
Admission: RE | Admit: 2016-02-10 | Discharge: 2016-02-10 | Disposition: A | Discharge status: Home / Self Care | Payer: Federal, State, Local not specified - PPO | Source: Ambulatory Visit | Attending: Urology | Admitting: Urology

## 2016-02-10 ENCOUNTER — Encounter (HOSPITAL_COMMUNITY): Payer: Self-pay

## 2016-02-10 ENCOUNTER — Ambulatory Visit (HOSPITAL_COMMUNITY): Payer: Medicare Other

## 2016-02-10 DIAGNOSIS — N400 Enlarged prostate without lower urinary tract symptoms: Secondary | ICD-10-CM | POA: Diagnosis present

## 2016-02-10 MED ORDER — GENTAMICIN SULFATE 40 MG/ML IJ SOLN
INTRAMUSCULAR | Status: AC
  Administered 2016-02-10: 160 mg via INTRAMUSCULAR
  Filled 2016-02-10: qty 4

## 2016-02-10 MED ORDER — LIDOCAINE HCL (PF) 2 % IJ SOLN
INTRAMUSCULAR | Status: AC
  Administered 2016-02-10: 10 mL
  Filled 2016-02-10: qty 10

## 2016-02-10 MED ORDER — GENTAMICIN SULFATE 40 MG/ML IJ SOLN
160.0000 mg | Freq: Once | INTRAMUSCULAR | Status: AC
  Administered 2016-02-10: 160 mg via INTRAMUSCULAR

## 2016-05-04 ENCOUNTER — Encounter (HOSPITAL_COMMUNITY): Payer: Self-pay

## 2016-05-04 ENCOUNTER — Emergency Department (HOSPITAL_COMMUNITY): Payer: Federal, State, Local not specified - PPO

## 2016-05-04 ENCOUNTER — Emergency Department (HOSPITAL_COMMUNITY)
Admission: EM | Admit: 2016-05-04 | Discharge: 2016-05-04 | Disposition: A | Discharge status: Home / Self Care | Payer: Federal, State, Local not specified - PPO | Attending: Emergency Medicine | Admitting: Emergency Medicine

## 2016-05-04 DIAGNOSIS — Z8546 Personal history of malignant neoplasm of prostate: Secondary | ICD-10-CM | POA: Diagnosis not present

## 2016-05-04 DIAGNOSIS — R110 Nausea: Secondary | ICD-10-CM | POA: Diagnosis not present

## 2016-05-04 DIAGNOSIS — M25511 Pain in right shoulder: Secondary | ICD-10-CM | POA: Insufficient documentation

## 2016-05-04 DIAGNOSIS — R079 Chest pain, unspecified: Secondary | ICD-10-CM | POA: Diagnosis present

## 2016-05-04 DIAGNOSIS — E785 Hyperlipidemia, unspecified: Secondary | ICD-10-CM | POA: Diagnosis not present

## 2016-05-04 DIAGNOSIS — I10 Essential (primary) hypertension: Secondary | ICD-10-CM | POA: Diagnosis not present

## 2016-05-04 DIAGNOSIS — Z87891 Personal history of nicotine dependence: Secondary | ICD-10-CM | POA: Diagnosis not present

## 2016-05-04 DIAGNOSIS — M1990 Unspecified osteoarthritis, unspecified site: Secondary | ICD-10-CM | POA: Insufficient documentation

## 2016-05-04 DIAGNOSIS — M5412 Radiculopathy, cervical region: Secondary | ICD-10-CM | POA: Insufficient documentation

## 2016-05-04 DIAGNOSIS — I2510 Atherosclerotic heart disease of native coronary artery without angina pectoris: Secondary | ICD-10-CM | POA: Diagnosis not present

## 2016-05-04 DIAGNOSIS — Z79899 Other long term (current) drug therapy: Secondary | ICD-10-CM | POA: Diagnosis not present

## 2016-05-04 DIAGNOSIS — J45909 Unspecified asthma, uncomplicated: Secondary | ICD-10-CM | POA: Insufficient documentation

## 2016-05-04 LAB — CBC WITH DIFFERENTIAL/PLATELET
Basophils Absolute: 0 10*3/uL (ref 0.0–0.1)
Basophils Relative: 1 %
Eosinophils Absolute: 0.2 10*3/uL (ref 0.0–0.7)
Eosinophils Relative: 4 %
HCT: 44.6 % (ref 39.0–52.0)
Hemoglobin: 14.1 g/dL (ref 13.0–17.0)
Lymphocytes Relative: 25 %
Lymphs Abs: 1.6 10*3/uL (ref 0.7–4.0)
MCH: 28.1 pg (ref 26.0–34.0)
MCHC: 31.6 g/dL (ref 30.0–36.0)
MCV: 89 fL (ref 78.0–100.0)
Monocytes Absolute: 0.7 10*3/uL (ref 0.1–1.0)
Monocytes Relative: 11 %
Neutro Abs: 3.8 10*3/uL (ref 1.7–7.7)
Neutrophils Relative %: 59 %
Platelets: 373 10*3/uL (ref 150–400)
RBC: 5.01 MIL/uL (ref 4.22–5.81)
RDW: 14.4 % (ref 11.5–15.5)
WBC: 6.3 10*3/uL (ref 4.0–10.5)

## 2016-05-04 LAB — BASIC METABOLIC PANEL
Anion gap: 11 (ref 5–15)
BUN: 11 mg/dL (ref 6–20)
CO2: 24 mmol/L (ref 22–32)
Calcium: 9 mg/dL (ref 8.9–10.3)
Chloride: 105 mmol/L (ref 101–111)
Creatinine, Ser: 1.02 mg/dL (ref 0.61–1.24)
GFR calc Af Amer: >60 mL/min (ref >60)
GFR calc non Af Amer: >60 mL/min (ref >60)
Glucose, Bld: 88 mg/dL (ref 65–99)
Potassium: 3.7 mmol/L (ref 3.5–5.1)
Sodium: 140 mmol/L (ref 135–145)

## 2016-05-04 LAB — TROPONIN I: Troponin I: <0.03 ng/mL (ref <0.031)

## 2016-05-04 MED ORDER — IOPAMIDOL (ISOVUE-370) INJECTION 76%
100.0000 mL | Freq: Once | INTRAVENOUS | Status: AC | PRN
  Administered 2016-05-04: 100 mL via INTRAVENOUS

## 2016-05-04 NOTE — ED Notes (Signed)
Pt reports pain in r shoulder blade, chest, and r arm x 45 min.

## 2016-05-04 NOTE — Discharge Instructions (Signed)
Try using heat on the sore area and take ibuprofen 3 times a day to help with discomfort. Follow-up with your neurosurgeon for further treatment if needed.   Cervical Radiculopathy Cervical radiculopathy means that a nerve in the neck is pinched or bruised. This can cause pain or loss of feeling (numbness) that runs from your neck to your arm and fingers. HOME CARE Managing Pain  Take over-the-counter and prescription medicines only as told by your doctor.  If directed, put ice on the injured or painful area.  Put ice in a plastic bag.  Place a towel between your skin and the bag.  Leave the ice on for 20 minutes, 2-3 times per day.  If ice does not help, you can try using heat. Take a warm shower or warm bath, or use a heat pack as told by your doctor.  You may try a gentle neck and shoulder massage. Activity  Rest as needed. Follow instructions from your doctor about any activities to avoid.  Do exercises as told by your doctor or physical therapist. General Instructions   If you were given a soft collar, wear it as told by your doctor.  Use a flat pillow when you sleep.  Keep all follow-up visits as told by your doctor. This is important. GET HELP IF:  Your condition does not improve with treatment. GET HELP RIGHT AWAY IF:   Your pain gets worse and is not controlled with medicine.  You lose feeling or feel weak in your hand, arm, face, or leg.  You have a fever.  You have a stiff neck.  You cannot control when you poop or pee (have incontinence).  You have trouble with walking, balance, or talking.   This information is not intended to replace advice given to you by your health care provider. Make sure you discuss any questions you have with your health care provider.   Document Released: 11/25/2011 Document Revised: 08/27/2015 Document Reviewed: 01/30/2015 Elsevier Interactive Patient Education [employer].

## 2016-05-04 NOTE — ED Provider Notes (Signed)
CSN: [id number]     Arrival date & time 05/04/16  1221 History  By signing my name below, I, Emmanuella Mensah, attest that this documentation has been prepared under the direction and in the presence of Daleen Bo, MD. Electronically Signed: Judithann Sauger, ED Scribe. 05/04/2016. 12:53 PM.    Chief Complaint  Patient presents with  . Chest Pain   Patient is a 70 y.o. male presenting with chest pain. The history is provided by the patient. No language interpreter was used.  Chest Pain Pain location:  R chest Pain quality: radiating   Pain radiates to:  R arm and R shoulder Pain radiates to the back: no   Pain severity:  Moderate Onset quality:  Gradual Duration:  45 minutes Timing:  Constant Progression:  Improving Chronicity:  New Relieved by:  None tried Worsened by:  Nothing tried Ineffective treatments:  None tried Associated symptoms: nausea   Associated symptoms: no fever, no shortness of breath and not vomiting    HPI Comments: Cody Velez is a 70 y.o. male with a hx of coronary atherosclerosis of native coronary artery who presents to the Emergency Department complaining of gradual onset of improving 4/10 right posterior shoulder blade pain that radiates to his right arm onset approx. 45 minutes. He reports associated right-sided CP, nausea, and neck pain. He denies increased pain with movement of the neck, or right shoulder. There is no numbness in the right hand. There is no weakness of the extremities. No alleviating factors noted. Pt has not tried any medications PTA. He reports that he had neck surgery in 2016. He denies a hx of MI or other heart issues. He adds that he has not been able to eat appropriately, mostly eating milkyway bars and drinking soda. He denies any fever, vomiting, or SOB. There are no other known modifying factors.   Past Medical History  Diagnosis Date  . Coronary atherosclerosis of native coronary artery     Mild nonobstructive CAD at  catheterization January 2015  . Rectal bleeding   . Hyperlipidemia   . Allergic rhinitis   . Essential hypertension, benign   . Depression   . OSA (obstructive sleep apnea)     CPAP - Dr. [name]  . Asthma   . History of pneumonia 02/2011  . GERD (gastroesophageal reflux disease)   . Arthritis   . Chronic back pain   . PTSD (post-traumatic stress disorder)     [place]  . Memory difficulty 07/23/2015  . History of cerebrovascular disease 07/23/2015  . B12 deficiency   . Cancer [facility])     prostate  . Falls   . Kidney stone   . Cervicogenic headache 01/28/2016   Past Surgical History  Procedure Laterality Date  . Bilateral knee arthroscopy    . Facial cosmetic surgery  ` 1985    "broke face playing softball"  . Fracture surgery      "left wrist; broke it; took spur off"  . Esophagogastroduodenoscopy  10/15/2008      Dr Rourk:Schatzki's ring status post dilation and disruption via 18 F Maloney dilator/ otherwise unremarkable esophagus, small hiatal hernia, multiple fundal gland polyps not manipulated, gastritis, negative H.pylori  . Colonoscopy  10/15/2008    Dr. [name]: tubular adenoma   . Colonoscopy  12/17/2003    [id number] rectal and colon  . Esophagogastroduodenoscopy  06/21/02    [id number] sliding hiatal hernia with mild changes of reflux esophagitis limited to gastroesophageal junction.  Noncritical ring at distal esophagus,  3 cm proximal to gastroesophageal junction/Antral gastritis  . Neck epidural    . Colonoscopy N/A 09/05/2014    Procedure: COLONOSCOPY;  Surgeon: Daneil Dolin, MD;  Location: [facility] ENDO SUITE;  Service: Endoscopy;  Laterality: N/A;  7:30-rescheduled 9/17 to [place] notified pt  . Left heart catheterization with coronary angiogram N/A 12/27/2013    Procedure: LEFT HEART CATHETERIZATION WITH CORONARY ANGIOGRAM;  Surgeon: [name], MD;  Location: [facility] CATH LAB;  Service: Cardiovascular;  Laterality: N/A;  . Shoulder surgery Bilateral   . Back surgery   02/14/12    lumbar OR #7; "today redid L1L2; replaced screws; added bone from hip"  . Cystoscopy/retrograde/ureteroscopy/stone extraction with basket Right 01/27/2016    Procedure: CYSTOSCOPY, RIGHT RETROGRADE, RIGHT URETEROSCOPY, STONE EXTRACTION ;  Surgeon: [name], MD;  Location: [facility] ORS;  Service: Urology;  Laterality: Right;  . Holmium laser application Right [id number]    Procedure: HOLMIUM LASER APPLICATION;  Surgeon: [name], MD;  Location: [facility] ORS;  Service: Urology;  Laterality: Right;  . Cystoscopy with stent placement Right 01/27/2016    Procedure: CYSTOSCOPY WITH STENT PLACEMENT;  Surgeon: [name], MD;  Location: [facility] ORS;  Service: Urology;  Laterality: Right;   Family History  Problem Relation Age of Onset  . Emphysema Father   . Heart failure Father   . Lung cancer Father   . CAD Father   . Emphysema Maternal Grandmother   . Stroke Maternal Grandmother   . Asthma Other     grandson  . Heart disease Paternal Grandfather   . Colon cancer Mother   . Stroke Mother   . Breast cancer Mother   . Stroke Sister   . Anesthesia problems Neg Hx   . Hypotension Neg Hx   . Malignant hyperthermia Neg Hx   . Pseudochol deficiency Neg Hx   . Heart attack Brother   . Dementia Paternal Uncle    Social History  Substance Use Topics  . Smoking status: Former Smoker    Types: Cigarettes    Quit date: 12/20/1958  . Smokeless tobacco: Current User    Types: Chew     Comment: Quit chewing 04/2015  . Alcohol Use: 0.0 oz/week    0 Standard drinks or equivalent per week     Comment: couple bottles of wine per month    Review of Systems  Constitutional: Negative for fever.  Respiratory: Negative for shortness of breath.   Cardiovascular: Positive for chest pain.  Gastrointestinal: Positive for nausea. Negative for vomiting.  Musculoskeletal: Positive for arthralgias.  All other systems reviewed and are negative.     Allergies  Review of patient's allergies  indicates no known allergies.  Home Medications   Prior to Admission medications   Medication Sig Start Date End Date Taking? Authorizing Provider  amLODipine (NORVASC) 10 MG tablet Take 10 mg by mouth daily.   Yes Historical Provider, MD  diazepam (VALIUM) 5 MG tablet Take 5 mg by mouth 2 (two) times daily as needed for anxiety.   Yes Historical Provider, MD  diclofenac (VOLTAREN) 75 MG EC tablet Take 75 mg by mouth 2 (two) times daily.   Yes Historical Provider, MD  donepezil (ARICEPT) 10 MG tablet Take 1 tablet (10 mg total) by mouth at bedtime. 01/28/16  Yes Kathrynn Ducking, MD  finasteride (PROSCAR) 5 MG tablet Take 5 mg by mouth daily. Reported on 05/04/2016   Yes Historical Provider, MD  losartan (COZAAR) 100 MG tablet Take 100 mg  by mouth every morning.    Yes Historical Provider, MD  methocarbamol (ROBAXIN) 500 MG tablet Take 500 mg by mouth every 6 (six) hours as needed for muscle spasms.   Yes Historical Provider, MD  oxybutynin (DITROPAN) 5 MG tablet Take 1 tablet (5 mg total) by mouth every 8 (eight) hours as needed for bladder spasms. 01/27/16  Yes [name], MD  pravastatin (PRAVACHOL) 10 MG tablet Take 10 mg by mouth every evening.    Yes Historical Provider, MD  tamsulosin (FLOMAX) 0.4 MG CAPS capsule Take 0.4 mg by mouth at bedtime.   Yes Historical Provider, MD  tiZANidine (ZANAFLEX) 2 MG tablet Take 1 tablet (2 mg total) by mouth 3 (three) times daily. 01/28/16  Yes Kathrynn Ducking, MD  traZODone (DESYREL) 100 MG tablet Take 100 mg by mouth at bedtime.   Yes Historical Provider, MD  venlafaxine XR (EFFEXOR-XR) 150 MG 24 hr capsule Take 150 mg by mouth 2 (two) times daily.    Yes Historical Provider, MD  cephALEXin (KEFLEX) 250 MG capsule Take 1 capsule (250 mg total) by mouth 2 (two) times daily. Patient not taking: Reported on 05/04/2016 01/27/16   [name], MD   BP 136/77 mmHg  Pulse 64  Temp(Src) 98.7 F (37.1 C) (Oral)  Resp 21  SpO2 99% Physical Exam   Constitutional: He is oriented to person, place, and time. He appears well-developed and well-nourished.  HENT:  Head: Normocephalic and atraumatic.  Cardiovascular: Normal rate.   Pulmonary/Chest: Effort normal.  Neurological: He is alert and oriented to person, place, and time.  Skin: Skin is warm and dry.  Psychiatric: He has a normal mood and affect.  Nursing note and vitals reviewed.   ED Course  Procedures (including critical care time) DIAGNOSTIC STUDIES: Oxygen Saturation is 94% on RA, normal by my interpretation.    COORDINATION OF CARE: 12:53 PM- Pt advised of plan for treatment and pt agrees. Pt will receive chest x-ray, EKG, and lab work for further evaluation.   Medications  iopamidol (ISOVUE-370) 76 % injection 100 mL (100 mLs Intravenous Contrast Given 05/04/16 1453)    Patient Vitals for the past 24 hrs:  BP Temp Temp src Pulse Resp SpO2  05/04/16 1600 136/77 mmHg - - - 21 -  05/04/16 1505 - - - - 21 -  05/04/16 1500 135/73 mmHg - - - - 99 %  05/04/16 1400 138/85 mmHg - - 64 12 96 %  05/04/16 1300 140/88 mmHg - - 70 14 96 %  05/04/16 1228 137/81 mmHg 98.7 F (37.1 C) Oral 68 18 94 %      1:55 PM Reevaluation with update and discussion. After initial assessment and treatment, an updated evaluation reveals Patient states he feels better. He now recalls that he was throwing a football yesterday, and may have aggravated his shoulder. However, he still does not have pain with movement of the neck or shoulder. Because of the lack of musculoskeletal pain, and an abnormal chest x-ray, I elected to do a CT angiogram to evaluate for aortic dissection. Patient agrees with this approach.Daleen Bo L   4:06 PM Reevaluation with update and discussion. After initial assessment and treatment, an updated evaluation reveals He is comfortable, expresses no further complaints, and is ready to go. Findings discussed with him and his wife. I notified them of the incidental  findings of a left lower lobe nodule and left-sided kidney stones which are present on the CT imaging. All questions were answered.  The patient declined prescriptions for prednisone for anti-inflammatory effect, and analgesic medicines, narcotic. [place] Review Labs Reviewed  CBC WITH DIFFERENTIAL/PLATELET  BASIC METABOLIC PANEL  TROPONIN I  CBC    Imaging Review Dg Chest Portable 1 View  05/04/2016  CLINICAL DATA:  Right-sided chest and shoulder pain for 1 hour EXAM: PORTABLE CHEST 1 VIEW COMPARISON:  12/04/2013 FINDINGS: Cardiac shadow is enlarged. The lungs are clear. No bony abnormality is noted. Postsurgical changes in the cervical spine are seen. IMPRESSION: No acute abnormality noted. Electronically Signed   By: Inez Catalina M.D.   On: 05/04/2016 12:57   Ct Angio Chest/abd/pel For Dissection W And/or W/wo  05/04/2016  CLINICAL DATA:  70 year old with right sided chest pain. Right arm pain. EXAM: CT ANGIOGRAPHY CHEST, ABDOMEN AND PELVIS TECHNIQUE: Multidetector CT imaging through the chest, abdomen and pelvis was performed using the standard protocol during bolus administration of intravenous contrast. Multiplanar reconstructed images and MIPs were obtained and reviewed to evaluate the vascular anatomy. CONTRAST:  100 mL Isovue 370 COMPARISON:  Chest CT 03/05/2011.  Abdominal CT 01/14/2016 FINDINGS: CTA CHEST FINDINGS No evidence for an aortic dissection or aneurysm. There is motion artifact at the aortic root and ascending thoracic aorta. No evidence for an aortic intramural hematoma. Few coronary artery calcifications. Atherosclerotic calcifications at the aortic arch. The great vessels are patent. The main and central pulmonary arteries are patent without large filling defects. Right thyroid tissue may be smaller slightly atrophic. Thyroid tissue is heterogeneous. Soft tissue fullness in the right hilum on sequence 5 image 61 measures 1.3 cm in the short axis and this is  unchanged since 2012. The trachea and mainstem bronchi are patent. Few punctate nodular densities in the right middle lobe appear to be chronic and likely postinflammatory. Hazy peripheral densities along the medial right lower lobe are probably chronic. There is a 6 mm nodule in the left lower lobe on sequence 6, image 63. This nodule measured roughly 5 mm in 2012. No acute bone abnormality. No large pleural effusions. Review of the MIP images confirms the above findings. CTA ABDOMEN AND PELVIS FINDINGS Normal caliber of the abdominal aorta without dissection. Celiac trunk and main branch vessels are patent without significant stenosis. Incidentally, portion of the left hepatic artery distribution comes from the left gastric artery. SMA is widely patent without significant stenosis. Single bilateral renal arteries with atherosclerotic calcifications but no significant stenosis. Inferior mesenteric artery is patent. Mild atherosclerotic calcifications in the left common iliac artery. Left common iliac artery measures up to 1.6 cm. Proximal femoral arteries are patent. Bilateral internal and external iliac arteries are patent. Normal appearance of the liver and gallbladder. Normal appearance of the pancreas without inflammation or duct dilatation. Normal appearance of spleen without enlargement. Normal adrenal glands. There is a 1.5 cm exophytic cyst involving the right kidney upper pole. Evidence for at least 3 stones in the left kidney, largest measuring roughly 4 mm. Negative for hydronephrosis. No acute abnormalities involving the stomach, small bowel or colon. Normal appearance of the appendix. No suspicious lymphadenopathy in the abdomen or pelvis. Prostate is prominent measuring 5.3 cm in transverse dimension. There is an defect along the lateral aspect of the left ilium most likely taken for bone graft material. Interbody fusion at L4-L5 and L5-S1. Pedicle screw and rod fixation at L1 through L3. Review of  the MIP images confirms the above findings. IMPRESSION: Negative for an aortic dissection or aneurysm. No evidence for  a large or central pulmonary embolism. Nonobstructive left kidney stones. 6 mm pulmonary nodule in the left lower lobe. This nodule has minimally changed since 2012 and likely represents a benign etiology. Electronically Signed   By: Markus Daft M.D.   On: 05/04/2016 15:49     Daleen Bo, MD has personally reviewed and evaluated these images and lab results as part of his medical decision-making.   EKG Interpretation   Date/Time:  Tuesday May 04 2016 12:28:01 EDT Ventricular Rate:  66 PR Interval:  161 QRS Duration: 114 QT Interval:  431 QTC Calculation: 452 R Axis:   -44 Text Interpretation:  Sinus rhythm Atrial premature complex Incomplete  RBBB and LAFB Confirmed by ZACKOWSKI  MD, SCOTT [id number]) on 05/04/2016  12:32:20 PM      MDM   Final diagnoses:  Right shoulder pain  Cervical radiculopathy     Right shoulder pain, comprehensively evaluated, without findings for cardiac cardiovascular, pulmonary, or isolated right shoulder pathology. Most likely scenario is aggravation of chronic cervical  disease, and or cervical radiculopathy. Doubt fracture, spinal myelopathy, rotator cuff injury or impending vascular collapse.   Nursing Notes Reviewed/ Care Coordinated Applicable Imaging Reviewed Interpretation of Laboratory Data incorporated into ED treatment  The patient appears reasonably screened and/or stabilized for discharge and I doubt any other medical condition or other [facility] requiring further screening, evaluation, or treatment in the ED at this time prior to discharge.  Plan: Home Medications- IBU; Home Treatments- Heat; return here if the recommended treatment, does not improve the symptoms; Recommended follow up- neurosurgery, as needed, and PCP, as needed.    I personally performed the services described in this documentation, which was scribed in my  presence. The recorded information has been reviewed and is accurate.      Daleen Bo, MD 05/04/16 [phone]

## 2016-06-09 DIAGNOSIS — M5481 Occipital neuralgia: Secondary | ICD-10-CM | POA: Insufficient documentation

## 2016-06-21 ENCOUNTER — Ambulatory Visit (INDEPENDENT_AMBULATORY_CARE_PROVIDER_SITE_OTHER): Payer: Federal, State, Local not specified - PPO | Admitting: Cardiology

## 2016-06-21 ENCOUNTER — Encounter: Payer: Self-pay | Admitting: Cardiology

## 2016-06-21 VITALS — BP 107/65 | HR 63 | Ht 75.0 in | Wt 302.4 lb

## 2016-06-21 DIAGNOSIS — I10 Essential (primary) hypertension: Secondary | ICD-10-CM | POA: Diagnosis not present

## 2016-06-21 DIAGNOSIS — I2510 Atherosclerotic heart disease of native coronary artery without angina pectoris: Secondary | ICD-10-CM | POA: Diagnosis not present

## 2016-06-21 NOTE — Patient Instructions (Signed)
Your physician wants you to follow-up in: 1 YEAR WITH DR MCDOWELL You will receive a reminder letter in the mail two months in advance. If you don't receive a letter, please call our office to schedule the follow-up appointment.  Your physician recommends that you continue on your current medications as directed. Please refer to the Current Medication list given to you today.  Thank you for choosing [facility]!!    

## 2016-06-21 NOTE — Progress Notes (Signed)
Cardiology Office Note  Date: 06/21/2016   ID: Cody Velez, DOB Jul 18, 1946, MRN [id number]  PCP: Asencion Noble, MD  Primary Cardiologist: Rozann Lesches, MD   Chief Complaint  Patient presents with  . Coronary Artery Disease    History of Present Illness: Cody Velez is a 70 y.o. male last seen by Ms. Bonnell Public PA-C October 2016 and evaluated for chest pain. He was referred for a follow-up Cardiolite study which was low risk as outlined below. More recently he had an ER evaluation by Dr. Eulis Foster with chest pain, negative cardiac markers, no acute findings by chest CT.  He presents today for a routine visit, states that he has not had any significant chest pain recently. Blood pressure is well controlled on current regimen. He has been having recurring headaches over the last year and followed with neurology. This has been his main complaint.  Past Medical History  Diagnosis Date  . Coronary atherosclerosis of native coronary artery     Mild nonobstructive CAD at catheterization January 2015  . Rectal bleeding   . Hyperlipidemia   . Allergic rhinitis   . Essential hypertension, benign   . Depression   . OSA (obstructive sleep apnea)     CPAP - Dr. [name]  . Asthma   . History of pneumonia 02/2011  . GERD (gastroesophageal reflux disease)   . Arthritis   . Chronic back pain   . PTSD (post-traumatic stress disorder)     [place]  . Memory difficulty 07/23/2015  . History of cerebrovascular disease 07/23/2015  . B12 deficiency   . Cancer [facility])     prostate  . Falls   . Kidney stone   . Cervicogenic headache 01/28/2016    Current Outpatient Prescriptions  Medication Sig Dispense Refill  . amLODipine (NORVASC) 10 MG tablet Take 10 mg by mouth daily.    . diazepam (VALIUM) 5 MG tablet Take 5 mg by mouth 2 (two) times daily as needed for anxiety.    . diclofenac (VOLTAREN) 75 MG EC tablet Take 75 mg by mouth 2 (two) times daily.    Marland Kitchen donepezil (ARICEPT) 10 MG tablet Take 1  tablet (10 mg total) by mouth at bedtime. 30 tablet 5  . esomeprazole (NEXIUM) 40 MG capsule Take 40 mg by mouth daily at 12 noon.    . finasteride (PROSCAR) 5 MG tablet Take 5 mg by mouth daily. Reported on 05/04/2016    . losartan (COZAAR) 100 MG tablet Take 100 mg by mouth every morning.     . methocarbamol (ROBAXIN) 500 MG tablet Take 500 mg by mouth every 6 (six) hours as needed for muscle spasms.    . Multiple Vitamin (MULTIVITAMIN WITH MINERALS) TABS tablet Take 1 tablet by mouth daily.    . nitroGLYCERIN (NITROSTAT) 0.4 MG SL tablet Place 0.4 mg under the tongue every 5 (five) minutes as needed for chest pain.    Marland Kitchen oxybutynin (DITROPAN) 5 MG tablet Take 1 tablet (5 mg total) by mouth every 8 (eight) hours as needed for bladder spasms. 15 tablet 0  . pravastatin (PRAVACHOL) 10 MG tablet Take 10 mg by mouth every evening.     . sildenafil (VIAGRA) 100 MG tablet Take 100 mg by mouth daily as needed for erectile dysfunction.    . tamsulosin (FLOMAX) 0.4 MG CAPS capsule Take 0.4 mg by mouth at bedtime.    Marland Kitchen tiZANidine (ZANAFLEX) 2 MG tablet Take 1 tablet (2 mg total) by mouth 3 (three) times  daily. 90 tablet 1  . traZODone (DESYREL) 100 MG tablet Take 100 mg by mouth at bedtime.    Marland Kitchen venlafaxine XR (EFFEXOR-XR) 150 MG 24 hr capsule Take 150 mg by mouth 2 (two) times daily.      No current facility-administered medications for this visit.   Allergies:  Review of patient's allergies indicates no known allergies.   Social History: The patient  reports that he quit smoking about 57 years ago. His smoking use included Cigarettes. His smokeless tobacco use includes Chew. He reports that he drinks alcohol. He reports that he does not use illicit drugs.   ROS:  Please see the history of present illness. Otherwise, complete review of systems is positive for bilateral knee pain, may beginning the replacements eventually.  All other systems are reviewed and negative.   Physical Exam: VS:  BP 107/65  mmHg  Pulse 63  Ht 6\' 3"  (1.905 m)  Wt 302 lb 6.4 oz (137.168 kg)  BMI 37.80 kg/m2  SpO2 95%, BMI Body mass index is 37.8 kg/(m^2).  Wt Readings from Last 3 Encounters:  06/21/16 302 lb 6.4 oz (137.168 kg)  01/28/16 293 lb 8 oz (133.131 kg)  01/27/16 299 lb (135.626 kg)    Overweight male, appears comfortable at rest, no active symptoms reported.  HEENT: Conjunctiva and lids normal, oropharynx clear.  Neck: Supple, no elevated JVP or carotid bruits, no thyromegaly.  Lungs: Clear to auscultation, nonlabored breathing at rest.  Cardiac: Regular rate and rhythm, S4 noted, soft systolic murmur, no pericardial rub.  Abdomen: Soft, nontender, bowel sounds present, no guarding or rebound.  Extremities: No pitting edema, distal pulses 2+.  ECG: I personally reviewed the tracing from 05/04/2016 which showed sinus rhythm with PAC, R' in lead V1, and left anterior fascicular block.  Recent Labwork: 05/04/2016: BUN 11; Creatinine, Ser 1.02; Hemoglobin 14.1; Platelets 373; Potassium 3.7; Sodium 140     Component Value Date/Time   CHOL 175 05/30/2012 1913   TRIG 194* 05/30/2012 1913   HDL 28* 05/30/2012 1913   CHOLHDL 6.3 05/30/2012 1913   VLDL 39 05/30/2012 1913   LDLCALC 108* 05/30/2012 1913    Other Studies Reviewed Today:  Cardiac catheterization 12/27/2013: Coronary angiography: Coronary dominance: right  Left mainstem: Normal  Left anterior descending (LAD): The LAD wraps around the apex. Diffuse irregularities up to 20%. The first diagonal is moderate in size and normal.  Left circumflex (LCx): Gives rise to 2 large OM branches. Mild luminal irregularities.  Right coronary artery (RCA): Small in caliber. Normal.  Left ventriculography: Left ventricular systolic function is normal, LVEF is estimated at 55-65%, there is no significant mitral regurgitation   Final Conclusions:  1. Mild nonobstructive CAD 2. Normal LV function.  Lexiscan Cardiolite 10/10/2015:  There  was no ST segment deviation noted during stress.  The study is normal.  This is a low risk study.  The left ventricular ejection fraction is normal (55-65%).  Normal resting and stress perfusion.  No RWMA;s EF 57%  Assessment and Plan:  1. Mild coronary atherosclerosis based on prior cardiac catheterization with follow-up Cardiolite study from October of last year being low risk in the setting of chest pain. Continue observation for now. He remains on medical therapy including antihypertensives and Pravachol.  2. Essential hypertension, blood pressure is well controlled today.  Current medicines were reviewed with the patient today.  Disposition: Follow-up with me in one year.  Signed, [name], MD, [facility] 06/21/2016 3:22 PM  [facility] [place], [place], [place] [address] Phone: [phone]; Fax: [phone]

## 2016-06-24 ENCOUNTER — Encounter: Payer: Self-pay | Admitting: Neurology

## 2016-06-24 ENCOUNTER — Ambulatory Visit (INDEPENDENT_AMBULATORY_CARE_PROVIDER_SITE_OTHER): Payer: Federal, State, Local not specified - PPO | Admitting: Neurology

## 2016-06-24 VITALS — Ht 75.0 in | Wt 299.5 lb

## 2016-06-24 DIAGNOSIS — G8929 Other chronic pain: Secondary | ICD-10-CM | POA: Diagnosis not present

## 2016-06-24 DIAGNOSIS — M549 Dorsalgia, unspecified: Secondary | ICD-10-CM

## 2016-06-24 DIAGNOSIS — G4486 Cervicogenic headache: Secondary | ICD-10-CM

## 2016-06-24 DIAGNOSIS — I2510 Atherosclerotic heart disease of native coronary artery without angina pectoris: Secondary | ICD-10-CM

## 2016-06-24 DIAGNOSIS — R413 Other amnesia: Secondary | ICD-10-CM

## 2016-06-24 DIAGNOSIS — R51 Headache: Secondary | ICD-10-CM | POA: Diagnosis not present

## 2016-06-24 DIAGNOSIS — G4733 Obstructive sleep apnea (adult) (pediatric): Secondary | ICD-10-CM | POA: Diagnosis not present

## 2016-06-24 MED ORDER — DONEPEZIL HCL 10 MG PO TABS: 10.0000 mg | ORAL_TABLET | Freq: Every day | ORAL | Status: DC

## 2016-06-24 MED ORDER — TIZANIDINE HCL 2 MG PO TABS: 2.0000 mg | ORAL_TABLET | Freq: Three times a day (TID) | ORAL | Status: DC

## 2016-06-24 NOTE — Progress Notes (Signed)
Reason for visit: Memory disturbance  Cody Velez is an 70 y.o. male  History of present illness:  [name] is a 70 year old left-handed white male with a history of a memory disturbance, vitamin B12 deficiency, and severe headache associated with neck discomfort. The patient has pain levels that are better in the morning, worse as the day goes on. He indicates that the headaches do not keep him from sleeping. He has had occipital nerve injections that were not helpful. He was placed on Topamax through the [facility] without benefit. He has a significant increase in pain if he turns his head. He does have some mild gait instability. He denies any pain radiating down the arms. He is on Aricept for memory, his memory has been relatively stable, he has tolerated Aricept.  Past Medical History  Diagnosis Date  . Coronary atherosclerosis of native coronary artery     Mild nonobstructive CAD at catheterization January 2015  . Rectal bleeding   . Hyperlipidemia   . Allergic rhinitis   . Essential hypertension, benign   . Depression   . OSA (obstructive sleep apnea)     CPAP - Dr. [name]  . Asthma   . History of pneumonia 02/2011  . GERD (gastroesophageal reflux disease)   . Arthritis   . Chronic back pain   . PTSD (post-traumatic stress disorder)     Cody Velez  . Memory difficulty 07/23/2015  . History of cerebrovascular disease 07/23/2015  . B12 deficiency   . Cancer [facility])     prostate  . Falls   . Kidney stone   . Cervicogenic headache 01/28/2016    Past Surgical History  Procedure Laterality Date  . Bilateral knee arthroscopy    . Facial cosmetic surgery  ` 1985    "broke face playing softball"  . Fracture surgery      "left wrist; broke it; took spur off"  . Esophagogastroduodenoscopy  10/15/2008      Dr Rourk:Schatzki's ring status post dilation and disruption via 6 F Maloney dilator/ otherwise unremarkable esophagus, small hiatal hernia, multiple fundal gland polyps  not manipulated, gastritis, negative H.pylori  . Colonoscopy  10/15/2008    Dr. [name]: tubular adenoma   . Colonoscopy  12/17/2003    [id number] rectal and colon  . Esophagogastroduodenoscopy  06/21/02    [id number] sliding hiatal hernia with mild changes of reflux esophagitis limited to gastroesophageal junction.  Noncritical ring at distal esophagus, 3 cm proximal to gastroesophageal junction/Antral gastritis  . Neck epidural    . Colonoscopy N/A 09/05/2014    Procedure: COLONOSCOPY;  Surgeon: Daneil Dolin, MD;  [facility];  Service: Endoscopy;  Laterality: N/A;  7:30-rescheduled 9/17 to [place] notified pt  . Left heart catheterization with coronary angiogram N/A 12/27/2013    Procedure: LEFT HEART CATHETERIZATION WITH CORONARY ANGIOGRAM;  Surgeon: [name], MD;  Location: [facility] CATH LAB;  Service: Cardiovascular;  Laterality: N/A;  . Shoulder surgery Bilateral   . Back surgery  02/14/12    lumbar OR #7; "today redid L1L2; replaced screws; added bone from hip"  . Cystoscopy/retrograde/ureteroscopy/stone extraction with basket Right 01/27/2016    Procedure: CYSTOSCOPY, RIGHT RETROGRADE, RIGHT URETEROSCOPY, STONE EXTRACTION ;  Surgeon: [name], MD;  Location: [facility] ORS;  Service: Urology;  Laterality: Right;  . Holmium laser application Right [id number]    Procedure: HOLMIUM LASER APPLICATION;  Surgeon: [name], MD;  Location: [facility] ORS;  Service: Urology;  Laterality: Right;  .  Cystoscopy with stent placement Right 01/27/2016    Procedure: CYSTOSCOPY WITH STENT PLACEMENT;  Surgeon: [name], MD;  Location: [facility] ORS;  Service: Urology;  Laterality: Right;    Family History  Problem Relation Age of Onset  . Emphysema Father   . Heart failure Father   . Lung cancer Father   . CAD Father   . Emphysema Maternal Grandmother   . Stroke Maternal Grandmother   . Asthma Other     grandson  . Heart disease Paternal Grandfather   . Colon cancer Mother   .  Stroke Mother   . Breast cancer Mother   . Stroke Sister   . Anesthesia problems Neg Hx   . Hypotension Neg Hx   . Malignant hyperthermia Neg Hx   . Pseudochol deficiency Neg Hx   . Heart attack Brother   . Dementia Paternal Uncle     Social history:  reports that he quit smoking about 57 years ago. His smoking use included Cigarettes. His smokeless tobacco use includes Chew. He reports that he drinks alcohol. He reports that he does not use illicit drugs.   No Known Allergies  Medications:  Prior to Admission medications   Medication Sig Start Date End Date Taking? Authorizing Provider  amLODipine (NORVASC) 10 MG tablet Take 10 mg by mouth daily.   Yes Historical Provider, MD  donepezil (ARICEPT) 10 MG tablet Take 1 tablet (10 mg total) by mouth at bedtime. 06/24/16  Yes Kathrynn Ducking, MD  finasteride (PROSCAR) 5 MG tablet Take 5 mg by mouth daily. Reported on 05/04/2016   Yes Historical Provider, MD  losartan (COZAAR) 100 MG tablet Take 100 mg by mouth every morning.    Yes Historical Provider, MD  methocarbamol (ROBAXIN) 500 MG tablet Take 500 mg by mouth every 6 (six) hours as needed for muscle spasms.   Yes Historical Provider, MD  oxybutynin (DITROPAN) 5 MG tablet Take 1 tablet (5 mg total) by mouth every 8 (eight) hours as needed for bladder spasms. 01/27/16  Yes [name], MD  pravastatin (PRAVACHOL) 10 MG tablet Take 10 mg by mouth every evening.    Yes Historical Provider, MD  sildenafil (VIAGRA) 100 MG tablet Take 100 mg by mouth daily as needed for erectile dysfunction.   Yes Historical Provider, MD  tamsulosin (FLOMAX) 0.4 MG CAPS capsule Take 0.4 mg by mouth at bedtime.   Yes Historical Provider, MD  tiZANidine (ZANAFLEX) 2 MG tablet Take 1 tablet (2 mg total) by mouth 3 (three) times daily. 06/24/16  Yes Kathrynn Ducking, MD  topiramate (TOPAMAX) 50 MG tablet Take 50 mg by mouth 2 (two) times daily.   Yes Historical Provider, MD  traZODone (DESYREL) 100 MG tablet Take  100 mg by mouth at bedtime.   Yes Historical Provider, MD  venlafaxine XR (EFFEXOR-XR) 150 MG 24 hr capsule Take 150 mg by mouth 2 (two) times daily.    Yes Historical Provider, MD  nitroGLYCERIN (NITROSTAT) 0.4 MG SL tablet Place 0.4 mg under the tongue every 5 (five) minutes as needed for chest pain. Reported on 06/24/2016    Historical Provider, MD    ROS:  Out of a complete 14 system review of symptoms, the patient complains only of the following symptoms, and all other reviewed systems are negative.  Fatigue Ringing in the ears Blurred vision Sleep apnea, daytime sleepiness Joint pain, back pain, aching muscles, muscle cramps, walking difficulty, neck pain  Height 6\' 3"  (1.905 m), weight 299 lb  8 oz (135.852 kg).  Physical Exam  General: The patient is alert and cooperative at the time of the examination.  Neuromuscular: The patient lacks about 30-40 of full lateral rotation of the cervical spine bilaterally.  Skin: No significant peripheral edema is noted.   Neurologic Exam  Mental status: The patient is alert and oriented x 3 at the time of the examination. The patient has apparent normal recent and remote memory, with an apparently normal attention span and concentration ability.Mini-Mental Status Examination done today shows a total score of 30/30.   Cranial nerves: Facial symmetry is present. Speech is normal, no aphasia or dysarthria is noted. Extraocular movements are full. Visual fields are full.  Motor: The patient has good strength in all 4 extremities.  Sensory examination: Soft touch sensation is symmetric on the face, arms, and legs.  Coordination: The patient has good finger-nose-finger and heel-to-shin bilaterally.  Gait and station: The patient has a normal gait. Tandem gait is slightly unsteady. Romberg is negative. No drift is seen.  Reflexes: Deep tendon reflexes are symmetric.   Assessment/Plan:  1. Cervicogenic headache  2. Mild memory  disturbance  3. Vitamin B12 deficiency  The patient will remain on Aricept, he is tolerating the medication well, a prescription was called in. The prescription for tizanidine was also called in, we will set the patient up for an epidural steroid injection to see if this helps his neck pain and headache. He will follow-up in 6 months, sooner if needed.  Jill Alexanders MD 06/24/2016 7:05 PM  [facility] [address] [place], [place] [address]  Phone [phone] Fax [phone]

## 2016-06-25 ENCOUNTER — Ambulatory Visit
Admission: RE | Admit: 2016-06-25 | Discharge: 2016-06-25 | Disposition: A | Discharge status: Home / Self Care | Payer: Federal, State, Local not specified - PPO | Source: Ambulatory Visit | Attending: Neurology | Admitting: Neurology

## 2016-06-25 ENCOUNTER — Other Ambulatory Visit: Payer: Self-pay | Admitting: Neurology

## 2016-06-25 DIAGNOSIS — G4486 Cervicogenic headache: Secondary | ICD-10-CM

## 2016-06-25 DIAGNOSIS — R51 Headache: Principal | ICD-10-CM

## 2016-06-25 MED ORDER — IOPAMIDOL (ISOVUE-M 300) INJECTION 61%
1.0000 mL | Freq: Once | INTRAMUSCULAR | Status: AC | PRN
Start: 2016-06-25 — End: 2016-06-25
  Administered 2016-06-25: 1 mL via EPIDURAL

## 2016-06-25 MED ORDER — TRIAMCINOLONE ACETONIDE 40 MG/ML IJ SUSP (RADIOLOGY)
60.0000 mg | Freq: Once | INTRAMUSCULAR | Status: AC
  Administered 2016-06-25: 60 mg via EPIDURAL

## 2016-06-25 NOTE — Discharge Instructions (Signed)

## 2016-07-06 ENCOUNTER — Telehealth: Payer: Self-pay | Admitting: Neurology

## 2016-07-06 NOTE — Telephone Encounter (Signed)
Pt called to advise injection in the neck did not work, what is the next step? Patient is aware Dr Viona Gilmore will be back tomorrow and this can wait until then

## 2016-07-07 NOTE — Telephone Encounter (Signed)
I called patient. The patient did not get any benefit with occipital nerve injections or epidural steroid injections of the neck. The patient is on tizanidine taking 2 mg 3 times daily, he believes that this may have helped slightly. We may try going up to 4 mg 3 times daily. If this is of no benefit, we may use baclofen down the road, the patient does stretch the neck frequently during the day.

## 2016-07-14 MED ORDER — BACLOFEN 10 MG PO TABS: 5.0000 mg | ORAL_TABLET | Freq: Three times a day (TID) | ORAL | 1 refills | Status: DC

## 2016-07-14 NOTE — Addendum Note (Signed)
Addended by: Margette Fast on: 07/14/2016 05:34 PM   Modules accepted: Orders

## 2016-07-14 NOTE — Telephone Encounter (Signed)
Patient is calling stating tiZANidine (ZANAFLEX) 2 MG tablet is not helping him even with the new dosage  Please call to discuss.

## 2016-07-14 NOTE — Telephone Encounter (Signed)
I called patient. The tizanidine is not helpful. The patient will be given a trial on baclofen instead. He has not responded well to other modalities such as occipital nerve injections or epidural injections.

## 2016-07-22 ENCOUNTER — Ambulatory Visit: Payer: Federal, State, Local not specified - PPO | Admitting: Neurology

## 2016-08-31 ENCOUNTER — Ambulatory Visit (INDEPENDENT_AMBULATORY_CARE_PROVIDER_SITE_OTHER): Payer: Federal, State, Local not specified - PPO | Admitting: Urology

## 2016-08-31 DIAGNOSIS — C61 Malignant neoplasm of prostate: Secondary | ICD-10-CM | POA: Diagnosis not present

## 2016-08-31 DIAGNOSIS — N529 Male erectile dysfunction, unspecified: Secondary | ICD-10-CM | POA: Diagnosis not present

## 2016-08-31 DIAGNOSIS — N401 Enlarged prostate with lower urinary tract symptoms: Secondary | ICD-10-CM

## 2016-09-14 ENCOUNTER — Other Ambulatory Visit: Payer: Self-pay | Admitting: Neurology

## 2016-09-30 ENCOUNTER — Encounter: Payer: Self-pay | Admitting: Neurology

## 2016-09-30 ENCOUNTER — Ambulatory Visit (INDEPENDENT_AMBULATORY_CARE_PROVIDER_SITE_OTHER): Payer: Federal, State, Local not specified - PPO | Admitting: Neurology

## 2016-09-30 VITALS — BP 139/83 | HR 71 | Ht 75.0 in | Wt 293.0 lb

## 2016-09-30 DIAGNOSIS — E538 Deficiency of other specified B group vitamins: Secondary | ICD-10-CM | POA: Diagnosis not present

## 2016-09-30 DIAGNOSIS — R413 Other amnesia: Secondary | ICD-10-CM | POA: Diagnosis not present

## 2016-09-30 DIAGNOSIS — I2510 Atherosclerotic heart disease of native coronary artery without angina pectoris: Secondary | ICD-10-CM

## 2016-09-30 DIAGNOSIS — G4486 Cervicogenic headache: Secondary | ICD-10-CM

## 2016-09-30 DIAGNOSIS — R51 Headache: Secondary | ICD-10-CM | POA: Diagnosis not present

## 2016-09-30 MED ORDER — BACLOFEN 10 MG PO TABS: 10.0000 mg | ORAL_TABLET | Freq: Three times a day (TID) | ORAL | 1 refills | Status: DC

## 2016-09-30 NOTE — Progress Notes (Signed)
Reason for visit: Headache  Cody Velez is an 70 y.o. male  History of present illness:  Cody Velez is a 70 year old left-handed white male with a history of a mild memory disturbance and a history of cervicogenic headache. The patient has had gradual worsening of his headache, he has been placed on baclofen which has helped the neck pain, but he has pressure sensations on the back of the head that are worsening. The patient sleeps well at night, he does use CPAP. The patient has trouble concentrating in part because of the headache pain, he feels poorly all the time. The headache never goes away. The patient been placed back on Topamax, gradually working up on the dose. Tizanidine did not help the headache. He is on Aricept for the memory, he is tolerating the medication well. He returns for an evaluation. He has also had some sensation of dizziness, vertigo that may be present with sitting, standing, or lying down.  Past Medical History:  Diagnosis Date  . Allergic rhinitis   . Arthritis   . Asthma   . B12 deficiency   . Cancer [facility])    prostate  . Cervicogenic headache 01/28/2016  . Chronic back pain   . Coronary atherosclerosis of native coronary artery    Mild nonobstructive CAD at catheterization January 2015  . Depression   . Essential hypertension, benign   . Falls   . GERD (gastroesophageal reflux disease)   . History of cerebrovascular disease 07/23/2015  . History of pneumonia 02/2011  . Hyperlipidemia   . Kidney stone   . Memory difficulty 07/23/2015  . OSA (obstructive sleep apnea)    CPAP - Dr. [name]  . PTSD (post-traumatic stress disorder)    [place]  . Rectal bleeding     Past Surgical History:  Procedure Laterality Date  . BACK SURGERY  02/14/12   lumbar OR #7; "today redid L1L2; replaced screws; added bone from hip"  . BILATERAL KNEE ARTHROSCOPY    . COLONOSCOPY  10/15/2008   Dr. [name]: tubular adenoma   . COLONOSCOPY  12/17/2003   [id number] rectal  and colon  . COLONOSCOPY N/A 09/05/2014   Procedure: COLONOSCOPY;  Surgeon: Daneil Dolin, MD;  [facility];  Service: Endoscopy;  Laterality: N/A;  7:30-rescheduled 9/17 to [place] notified pt  . CYSTOSCOPY WITH STENT PLACEMENT Right 01/27/2016   Procedure: CYSTOSCOPY WITH STENT PLACEMENT;  Surgeon: [name], MD;  Location: [facility] ORS;  Service: Urology;  Laterality: Right;  . CYSTOSCOPY/RETROGRADE/URETEROSCOPY/STONE EXTRACTION WITH BASKET Right 01/27/2016   Procedure: CYSTOSCOPY, RIGHT RETROGRADE, RIGHT URETEROSCOPY, STONE EXTRACTION ;  Surgeon: [name], MD;  Location: [facility] ORS;  Service: Urology;  Laterality: Right;  . ESOPHAGOGASTRODUODENOSCOPY  10/15/2008     Dr Rourk:Schatzki's ring status post dilation and disruption via 87 F Maloney dilator/ otherwise unremarkable esophagus, small hiatal hernia, multiple fundal gland polyps not manipulated, gastritis, negative H.pylori  . ESOPHAGOGASTRODUODENOSCOPY  06/21/02   [id number] sliding hiatal hernia with mild changes of reflux esophagitis limited to gastroesophageal junction.  Noncritical ring at distal esophagus, 3 cm proximal to gastroesophageal junction/Antral gastritis  . [place]   "broke face playing softball"  . FRACTURE SURGERY     "left wrist; broke it; took spur off"  . HOLMIUM LASER APPLICATION Right [id number]   Procedure: HOLMIUM LASER APPLICATION;  Surgeon: [name], MD;  Location: [facility] OR[facility];  Service: Urology;  Laterality: Right;  . LEFT HEART CATHETERIZATION WITH CORONARY  ANGIOGRAM N/A 12/27/2013   Procedure: LEFT HEART CATHETERIZATION WITH CORONARY ANGIOGRAM;  Surgeon: [name], MD;  Location: [facility] CATH LAB;  Service: Cardiovascular;  Laterality: N/A;  . neck epidural    . SHOULDER SURGERY Bilateral     Family History  Problem Relation Age of Onset  . Emphysema Father   . Heart failure Father   . Lung cancer Father   . CAD Father   . Colon cancer Mother   . Stroke  Mother   . Breast cancer Mother   . Stroke Sister   . Heart attack Brother   . Dementia Paternal Uncle   . Emphysema Maternal Grandmother   . Stroke Maternal Grandmother   . Asthma Other     grandson  . Heart disease Paternal Grandfather   . Anesthesia problems Neg Hx   . Hypotension Neg Hx   . Malignant hyperthermia Neg Hx   . Pseudochol deficiency Neg Hx     Social history:  reports that he quit smoking about 57 years ago. His smoking use included Cigarettes. His smokeless tobacco use includes Chew. He reports that he drinks alcohol. He reports that he does not use drugs.   No Known Allergies  Medications:  Prior to Admission medications   Medication Sig Start Date End Date Taking? Authorizing Provider  amLODipine (NORVASC) 10 MG tablet Take 10 mg by mouth daily.   Yes Historical Provider, MD  baclofen (LIORESAL) 10 MG tablet TAKE ONE-HALF TABLET TABLET BY MOUTH THREE TIMES A DAY 09/15/16  Yes Kathrynn Ducking, MD  donepezil (ARICEPT) 10 MG tablet Take 1 tablet (10 mg total) by mouth at bedtime. 06/24/16  Yes Kathrynn Ducking, MD  finasteride (PROSCAR) 5 MG tablet Take 5 mg by mouth daily. Reported on 05/04/2016   Yes Historical Provider, MD  losartan (COZAAR) 100 MG tablet Take 100 mg by mouth every morning.    Yes Historical Provider, MD  pravastatin (PRAVACHOL) 10 MG tablet Take 10 mg by mouth every evening.    Yes Historical Provider, MD  sildenafil (VIAGRA) 100 MG tablet Take 100 mg by mouth daily as needed for erectile dysfunction.   Yes Historical Provider, MD  topiramate (TOPAMAX) 50 MG tablet Take 50 mg by mouth 2 (two) times daily.   Yes Historical Provider, MD  traZODone (DESYREL) 100 MG tablet Take 100 mg by mouth at bedtime.   Yes Historical Provider, MD  venlafaxine XR (EFFEXOR-XR) 150 MG 24 hr capsule Take 150 mg by mouth 2 (two) times daily.    Yes Historical Provider, MD  nitroGLYCERIN (NITROSTAT) 0.4 MG SL tablet Place 0.4 mg under the tongue every 5 (five) minutes as  needed for chest pain. Reported on 06/24/2016    Historical Provider, MD    ROS:  Out of a complete 14 system review of symptoms, the patient complains only of the following symptoms, and all other reviewed systems are negative.  Sleep apnea Memory loss, dizziness, headache Decreased concentration  Blood pressure 139/83, pulse 71, height 6\' 3"  (1.905 m), weight 293 lb (132.9 kg).  Physical Exam  General: The patient is alert and cooperative at the time of the examination. The patient is moderately to markedly obese.  Neuromuscular: Range of movement of the cervical spine lacks about 30 of lateral rotation of the cervical spine bilaterally.  Skin: No significant peripheral edema is noted.   Neurologic Exam  Mental status: The patient is alert and oriented x 3 at the time of the examination. The  patient has apparent normal recent and remote memory, with an apparently normal attention span and concentration ability. Mini-Mental Status Examination done today shows a total score 28/30.   Cranial nerves: Facial symmetry is present. Speech is normal, no aphasia or dysarthria is noted. Extraocular movements are full. Visual fields are full.  Motor: The patient has good strength in all 4 extremities.  Sensory examination: Soft touch sensation is symmetric on the face, arms, and legs.  Coordination: The patient has good finger-nose-finger and heel-to-shin bilaterally.  Gait and station: The patient has a slightly wide-based gait. The patient walks with a cane. Tandem gait was not attempted. Romberg is negative. No drift is seen.  Reflexes: Deep tendon reflexes are symmetric.   Assessment/Plan:  1. Memory disorder  2. Cervicogenic headache  3. Mild gait disorder   4. Vitamin B12 deficiency  The patient continues to have significant headaches. We will go up on the baclofen taking 10 mg 3 times daily, if he is getting no benefit from this after 6 weeks, we may switch over to  gabapentin or Lyrica. The patient apparently has not been on vitamin B12 tablets as instructed, he was found to have a low B12 level one year ago, and he got a B12 injection. The patient will go back on the B12 tablets, we will need to check a vitamin B12 level in the future. The patient will follow-up in 6 months.  Jill Alexanders MD 09/30/2016 12:15 PM  [facility] [address] [place], [place] [address]  Phone [phone] Fax [phone]

## 2016-09-30 NOTE — Patient Instructions (Signed)
   Take vitamin B12 1000 mcg tablet daily.  Go up on the baclofen 10 mg tablet to one three times a day.

## 2016-12-23 ENCOUNTER — Telehealth: Payer: Self-pay | Admitting: Neurology

## 2016-12-23 NOTE — Telephone Encounter (Signed)
Patient has an appointment in April buts says he needs to be seen sooner. He is having problems with dizziness x 1 month and also severe headaches constantly.

## 2016-12-23 NOTE — Telephone Encounter (Signed)
Returned pt TC and work-in appt scheduled next Friday. Pt agreed to noon arrival.

## 2016-12-31 ENCOUNTER — Encounter: Payer: Self-pay | Admitting: Neurology

## 2016-12-31 ENCOUNTER — Ambulatory Visit (INDEPENDENT_AMBULATORY_CARE_PROVIDER_SITE_OTHER): Payer: Federal, State, Local not specified - PPO | Admitting: Neurology

## 2016-12-31 VITALS — BP 127/78 | HR 69 | Ht 75.0 in | Wt 298.0 lb

## 2016-12-31 DIAGNOSIS — R51 Headache: Secondary | ICD-10-CM | POA: Diagnosis not present

## 2016-12-31 DIAGNOSIS — R413 Other amnesia: Secondary | ICD-10-CM

## 2016-12-31 DIAGNOSIS — G4486 Cervicogenic headache: Secondary | ICD-10-CM

## 2016-12-31 MED ORDER — PREGABALIN 25 MG PO CAPS: 50.0000 mg | ORAL_CAPSULE | Freq: Two times a day (BID) | ORAL | 2 refills | Status: DC

## 2016-12-31 NOTE — Patient Instructions (Signed)
    With the Lyrica 25 mg take one twice a day for 2 weeks, then start 2 capsules twice a day.

## 2016-12-31 NOTE — Progress Notes (Signed)
Reason for visit: Cervicogenic headache, memory disorder  Cody Velez is an 71 y.o. male  History of present illness:  Cody Velez is a 71 year old left-handed white male with a history of a mild memory disturbance, currently on Aricept. The patient has been relatively stable with the memory over time. The patient comes back in today claiming to have increasing problems with neck pain, occipital headaches and dizziness. The patient has felt off balance at times, he has stumbled with no falling to date. The patient is on Topamax, baclofen, Effexor, and he takes occasional Percocet tablets. The Percocet tablets do alleviate the pain for 2 hours. The patient has had progressively worsening symptoms. He has had MRI evaluation of the cervical spine done within the last year and a half. He returns for an evaluation.  Past Medical History:  Diagnosis Date  . Allergic rhinitis   . Arthritis   . Asthma   . B12 deficiency   . Cancer [facility])    prostate  . Cervicogenic headache 01/28/2016  . Chronic back pain   . Coronary atherosclerosis of native coronary artery    Mild nonobstructive CAD at catheterization January 2015  . Depression   . Essential hypertension, benign   . Falls   . GERD (gastroesophageal reflux disease)   . History of cerebrovascular disease 07/23/2015  . History of pneumonia 02/2011  . Hyperlipidemia   . Kidney stone   . Memory difficulty 07/23/2015  . OSA (obstructive sleep apnea)    CPAP - Dr. [name]  . PTSD (post-traumatic stress disorder)    [place]  . Rectal bleeding     Past Surgical History:  Procedure Laterality Date  . BACK SURGERY  02/14/12   lumbar OR #7; "today redid L1L2; replaced screws; added bone from hip"  . BILATERAL KNEE ARTHROSCOPY    . COLONOSCOPY  10/15/2008   Dr. [name]: tubular adenoma   . COLONOSCOPY  12/17/2003   [id number] rectal and colon  . COLONOSCOPY N/A 09/05/2014   Procedure: COLONOSCOPY;  Surgeon: Daneil Dolin, MD;  [facility]  [facility];  Service: Endoscopy;  Laterality: N/A;  7:30-rescheduled 9/17 to [place] notified pt  . CYSTOSCOPY WITH STENT PLACEMENT Right 01/27/2016   Procedure: CYSTOSCOPY WITH STENT PLACEMENT;  Surgeon: [name], MD;  Location: [facility] ORS;  Service: Urology;  Laterality: Right;  . CYSTOSCOPY/RETROGRADE/URETEROSCOPY/STONE EXTRACTION WITH BASKET Right 01/27/2016   Procedure: CYSTOSCOPY, RIGHT RETROGRADE, RIGHT URETEROSCOPY, STONE EXTRACTION ;  Surgeon: [name], MD;  Location: [facility] ORS;  Service: Urology;  Laterality: Right;  . ESOPHAGOGASTRODUODENOSCOPY  10/15/2008     Dr Rourk:Schatzki's ring status post dilation and disruption via 60 F Maloney dilator/ otherwise unremarkable esophagus, small hiatal hernia, multiple fundal gland polyps not manipulated, gastritis, negative H.pylori  . ESOPHAGOGASTRODUODENOSCOPY  06/21/02   [id number] sliding hiatal hernia with mild changes of reflux esophagitis limited to gastroesophageal junction.  Noncritical ring at distal esophagus, 3 cm proximal to gastroesophageal junction/Antral gastritis  . [place]   "broke face playing softball"  . FRACTURE SURGERY     "left wrist; broke it; took spur off"  . HOLMIUM LASER APPLICATION Right [id number]   Procedure: HOLMIUM LASER APPLICATION;  Surgeon: [name], MD;  Location: [facility] ORS;  Service: Urology;  Laterality: Right;  . LEFT HEART CATHETERIZATION WITH CORONARY ANGIOGRAM N/A 12/27/2013   Procedure: LEFT HEART CATHETERIZATION WITH CORONARY ANGIOGRAM;  Surgeon: [name], MD;  Location: [facility] CATH LAB;  Service: Cardiovascular;  Laterality: N/A;  . neck epidural    . SHOULDER SURGERY Bilateral     Family History  Problem Relation Age of Onset  . Emphysema Father   . Heart failure Father   . Lung cancer Father   . CAD Father   . Colon cancer Mother   . Stroke Mother   . Breast cancer Mother   . Stroke Sister   . Heart attack Brother   . Dementia Paternal Uncle    . Emphysema Maternal Grandmother   . Stroke Maternal Grandmother   . Asthma Other     grandson  . Heart disease Paternal Grandfather   . Anesthesia problems Neg Hx   . Hypotension Neg Hx   . Malignant hyperthermia Neg Hx   . Pseudochol deficiency Neg Hx     Social history:  reports that he quit smoking about 58 years ago. His smoking use included Cigarettes. His smokeless tobacco use includes Chew. He reports that he drinks alcohol. He reports that he does not use drugs.   No Known Allergies  Medications:  Prior to Admission medications   Medication Sig Start Date End Date Taking? Authorizing Provider  amLODipine (NORVASC) 10 MG tablet Take 10 mg by mouth daily.   Yes Historical Provider, MD  aspirin 81 MG tablet Take 81 mg by mouth daily.   Yes Historical Provider, MD  baclofen (LIORESAL) 10 MG tablet Take 1 tablet (10 mg total) by mouth 3 (three) times daily. 09/30/16  Yes Kathrynn Ducking, MD  donepezil (ARICEPT) 10 MG tablet Take 1 tablet (10 mg total) by mouth at bedtime. 06/24/16  Yes Kathrynn Ducking, MD  finasteride (PROSCAR) 5 MG tablet Take 5 mg by mouth daily. Reported on 05/04/2016   Yes Historical Provider, MD  fluticasone (FLONASE) 50 MCG/ACT nasal spray Place 1 spray into both nostrils daily.   Yes Historical Provider, MD  losartan (COZAAR) 100 MG tablet Take 100 mg by mouth every morning.    Yes Historical Provider, MD  nitroGLYCERIN (NITROSTAT) 0.4 MG SL tablet Place 0.4 mg under the tongue every 5 (five) minutes as needed for chest pain. Reported on 06/24/2016   Yes Historical Provider, MD  omeprazole (PRILOSEC) 20 MG capsule Take 40 mg by mouth daily.   Yes Historical Provider, MD  oxyCODONE-acetaminophen (PERCOCET) 10-325 MG tablet  12/02/16  Yes Historical Provider, MD  pravastatin (PRAVACHOL) 10 MG tablet Take 10 mg by mouth every evening.    Yes Historical Provider, MD  sildenafil (VIAGRA) 100 MG tablet Take 100 mg by mouth daily as needed for erectile dysfunction.    Yes Historical Provider, MD  topiramate (TOPAMAX) 100 MG tablet Take 100 mg by mouth 2 (two) times daily.  12/08/16  Yes Historical Provider, MD  traZODone (DESYREL) 100 MG tablet Take 100 mg by mouth at bedtime.   Yes Historical Provider, MD  venlafaxine XR (EFFEXOR-XR) 150 MG 24 hr capsule Take 150 mg by mouth 2 (two) times daily.    Yes Historical Provider, MD  vitamin B-12 (CYANOCOBALAMIN) 1000 MCG tablet Take 1,000 mcg by mouth daily.   Yes Historical Provider, MD  pregabalin (LYRICA) 25 MG capsule Take 2 capsules (50 mg total) by mouth 2 (two) times daily. 12/31/16   Kathrynn Ducking, MD    ROS:  Out of a complete 14 system review of symptoms, the patient complains only of the following symptoms, and all other reviewed systems are negative.  Ringing in the ears Double vision, blurred vision Leg swelling  Excessive thirst Urinary urgency Back pain, neck pain Itching Memory loss, headache, numbness Agitation, confusion, decreased concentration, depression  Blood pressure 127/78, pulse 69, height 6\' 3"  (1.905 m), weight 298 lb (135.2 kg).  Physical Exam  General: The patient is alert and cooperative at the time of the examination. The patient is moderately to markedly obese.  Neuromuscular: Range of movement of the cervical spine lacks about 30 to 40 of full lateral rotation bilaterally.  Skin: No significant peripheral edema is noted.   Neurologic Exam  Mental status: The patient is alert and oriented x 3 at the time of the examination. The patient has apparent normal recent and remote memory, with an apparently normal attention span and concentration ability.    Cranial nerves: Facial symmetry is present. Speech is normal, no aphasia or dysarthria is noted. Extraocular movements are full. Visual fields are full.  Motor: The patient has good strength in all 4 extremities.  Sensory examination: Soft touch sensation is symmetric on the face, arms, and legs.  Coordination:  The patient has good finger-nose-finger and heel-to-shin bilaterally.  Gait and station: The patient has a normal gait. Tandem gait is unsteady. Romberg is negative. No drift is seen.  Reflexes: Deep tendon reflexes are symmetric.   Assessment/Plan:  1. Mild memory disturbance  2. Cervicogenic headache  The patient is having ongoing problems with headache and dizziness. The patient will remain on Aricept for the memory for now, he will be set up for an epidural steroid injection for the neck. The patient will be placed on low-dose Lyrica and I have recommended a taper off of the Topamax by 50 mg every 2 weeks until he is off the medication. He will follow-up in 5 months.  Jill Alexanders MD 12/31/2016 2:11 PM  [facility] [address] [place], [place] [address]  Phone [phone] Fax [phone]

## 2017-01-07 ENCOUNTER — Ambulatory Visit: Payer: Medicare Other | Admitting: Adult Health

## 2017-01-10 ENCOUNTER — Other Ambulatory Visit: Payer: Self-pay | Admitting: Neurology

## 2017-01-10 DIAGNOSIS — R51 Headache: Principal | ICD-10-CM

## 2017-01-10 DIAGNOSIS — G4486 Cervicogenic headache: Secondary | ICD-10-CM

## 2017-01-11 ENCOUNTER — Ambulatory Visit: Payer: Federal, State, Local not specified - PPO | Admitting: Neurology

## 2017-01-13 ENCOUNTER — Other Ambulatory Visit: Payer: Self-pay | Admitting: *Deleted

## 2017-01-13 ENCOUNTER — Ambulatory Visit
Admission: RE | Admit: 2017-01-13 | Discharge: 2017-01-13 | Disposition: A | Discharge status: Home / Self Care | Payer: Federal, State, Local not specified - PPO | Source: Ambulatory Visit | Attending: Neurology | Admitting: Neurology

## 2017-01-13 ENCOUNTER — Telehealth: Payer: Self-pay | Admitting: Neurology

## 2017-01-13 DIAGNOSIS — G4486 Cervicogenic headache: Secondary | ICD-10-CM

## 2017-01-13 DIAGNOSIS — R51 Headache: Principal | ICD-10-CM

## 2017-01-13 MED ORDER — PREGABALIN 25 MG PO CAPS: 50.0000 mg | ORAL_CAPSULE | Freq: Two times a day (BID) | ORAL | 2 refills | Status: DC

## 2017-01-13 MED ORDER — IOPAMIDOL (ISOVUE-M 300) INJECTION 61%
1.0000 mL | Freq: Once | INTRAMUSCULAR | Status: AC | PRN
  Administered 2017-01-13: 1 mL via EPIDURAL

## 2017-01-13 MED ORDER — TRIAMCINOLONE ACETONIDE 40 MG/ML IJ SUSP (RADIOLOGY)
60.0000 mg | Freq: Once | INTRAMUSCULAR | Status: AC
  Administered 2017-01-13: 60 mg via EPIDURAL

## 2017-01-13 NOTE — Telephone Encounter (Signed)
Patient stopped by the office and signed a release for the [place]. He like his medication Lyrica to be faxed to the [facility] for payment and processing fax: [phone]. Best call back is [phone]

## 2017-01-13 NOTE — Telephone Encounter (Signed)
[facility] and they have voided the Lyrica prescription brought in by the patient on 12/31/16.  His co-pay is unaffordable at the retail pharmacy and he needs it to be faxed to the [place] at the requested number below.  The prescription has been reprinted, faxed and confirmed.

## 2017-01-13 NOTE — Discharge Instructions (Signed)

## 2017-01-13 NOTE — Telephone Encounter (Signed)
Left message for patient to return my call.

## 2017-03-19 ENCOUNTER — Other Ambulatory Visit: Payer: Self-pay | Admitting: Gastroenterology

## 2017-03-22 ENCOUNTER — Ambulatory Visit (INDEPENDENT_AMBULATORY_CARE_PROVIDER_SITE_OTHER): Payer: Federal, State, Local not specified - PPO | Admitting: Urology

## 2017-03-22 DIAGNOSIS — C61 Malignant neoplasm of prostate: Secondary | ICD-10-CM | POA: Diagnosis not present

## 2017-03-22 DIAGNOSIS — N5201 Erectile dysfunction due to arterial insufficiency: Secondary | ICD-10-CM | POA: Diagnosis not present

## 2017-03-29 ENCOUNTER — Other Ambulatory Visit: Payer: Self-pay | Admitting: Neurology

## 2017-03-31 ENCOUNTER — Ambulatory Visit: Payer: Federal, State, Local not specified - PPO | Admitting: Neurology

## 2017-05-10 ENCOUNTER — Ambulatory Visit (INDEPENDENT_AMBULATORY_CARE_PROVIDER_SITE_OTHER): Payer: Federal, State, Local not specified - PPO | Admitting: Neurology

## 2017-05-10 ENCOUNTER — Encounter: Payer: Self-pay | Admitting: Neurology

## 2017-05-10 VITALS — BP 128/82 | HR 68 | Ht 75.0 in | Wt 303.5 lb

## 2017-05-10 DIAGNOSIS — R51 Headache: Secondary | ICD-10-CM | POA: Diagnosis not present

## 2017-05-10 DIAGNOSIS — R413 Other amnesia: Secondary | ICD-10-CM | POA: Diagnosis not present

## 2017-05-10 DIAGNOSIS — G4486 Cervicogenic headache: Secondary | ICD-10-CM

## 2017-05-10 MED ORDER — BACLOFEN 10 MG PO TABS: ORAL_TABLET | ORAL | 3 refills | Status: DC

## 2017-05-10 MED ORDER — PREGABALIN 100 MG PO CAPS: 100.0000 mg | ORAL_CAPSULE | Freq: Three times a day (TID) | ORAL | 1 refills | Status: DC

## 2017-05-10 NOTE — Patient Instructions (Signed)
   We will go up on the baclofen to 10 mg in the morning and midday and 2 at night.

## 2017-05-10 NOTE — Progress Notes (Signed)
Reason for visit: Cervicogenic headache  Cody Velez is an 71 y.o. male  History of present illness:  Cody Velez is a 71 year old left-handed white male with a history of cervical spondylosis and cervicogenic headache. The patient continues to complain of some mild memory problems, he has both short-term and long-term memory problems. He remains on Aricept, he believes that his memory has been relatively stable since last seen. The patient continues to have headaches, he has been placed on gabapentin through the [facility], but this is not as effective as Lyrica. Lyrica seemed to help him quite a bit with the headache. The patient has been noted to be somewhat staggery at times, he has not had any falls. He returns for an evaluation. He may at times have headaches at nighttime.   Past Medical History:  Diagnosis Date  . Allergic rhinitis   . Arthritis   . Asthma   . B12 deficiency   . Cancer [facility])    prostate  . Cervicogenic headache 01/28/2016  . Chronic back pain   . Coronary atherosclerosis of native coronary artery    Mild nonobstructive CAD at catheterization January 2015  . Depression   . Essential hypertension, benign   . Falls   . GERD (gastroesophageal reflux disease)   . History of cerebrovascular disease 07/23/2015  . History of pneumonia 02/2011  . Hyperlipidemia   . Kidney stone   . Memory difficulty 07/23/2015  . OSA (obstructive sleep apnea)    CPAP - Dr. [name]  . PTSD (post-traumatic stress disorder)    [place]  . Rectal bleeding     Past Surgical History:  Procedure Laterality Date  . BACK SURGERY  02/14/12   lumbar OR #7; "today redid L1L2; replaced screws; added bone from hip"  . BILATERAL KNEE ARTHROSCOPY    . COLONOSCOPY  10/15/2008   Dr. [name]: tubular adenoma   . COLONOSCOPY  12/17/2003   [id number] rectal and colon  . COLONOSCOPY N/A 09/05/2014   Procedure: COLONOSCOPY;  Surgeon: Daneil Dolin, MD;  [facility];  Service:  Endoscopy;  Laterality: N/A;  7:30-rescheduled 9/17 to [place] notified pt  . CYSTOSCOPY WITH STENT PLACEMENT Right 01/27/2016   Procedure: CYSTOSCOPY WITH STENT PLACEMENT;  Surgeon: [name], MD;  Location: [facility] ORS;  Service: Urology;  Laterality: Right;  . CYSTOSCOPY/RETROGRADE/URETEROSCOPY/STONE EXTRACTION WITH BASKET Right 01/27/2016   Procedure: CYSTOSCOPY, RIGHT RETROGRADE, RIGHT URETEROSCOPY, STONE EXTRACTION ;  Surgeon: [name], MD;  Location: [facility];  Service: Urology;  Laterality: Right;  . ESOPHAGOGASTRODUODENOSCOPY  10/15/2008     Dr Rourk:Schatzki's ring status post dilation and disruption via 60 F Maloney dilator/ otherwise unremarkable esophagus, small hiatal hernia, multiple fundal gland polyps not manipulated, gastritis, negative H.pylori  . ESOPHAGOGASTRODUODENOSCOPY  06/21/02   [id number] sliding hiatal hernia with mild changes of reflux esophagitis limited to gastroesophageal junction.  Noncritical ring at distal esophagus, 3 cm proximal to gastroesophageal junction/Antral gastritis  . [place]   "broke face playing softball"  . FRACTURE SURGERY     "left wrist; broke it; took spur off"  . HOLMIUM LASER APPLICATION Right 04/26/5276   Procedure: HOLMIUM LASER APPLICATION;  Surgeon: [name], MD;  Location: [facility] OR[facility];  Service: Urology;  Laterality: Right;  . LEFT HEART CATHETERIZATION WITH CORONARY ANGIOGRAM N/A 12/27/2013   Procedure: LEFT HEART CATHETERIZATION WITH CORONARY ANGIOGRAM;  Surgeon: [name], MD;  Location: [facility] CATH LAB;  Service: Cardiovascular;  Laterality:  N/A;  . neck epidural    . SHOULDER SURGERY Bilateral     Family History  Problem Relation Age of Onset  . Emphysema Father   . Heart failure Father   . Lung cancer Father   . CAD Father   . Colon cancer Mother   . Stroke Mother   . Breast cancer Mother   . Stroke Sister   . Heart attack Brother   . Dementia Paternal Uncle   . Emphysema Maternal  Grandmother   . Stroke Maternal Grandmother   . Asthma Other        grandson  . Heart disease Paternal Grandfather   . Anesthesia problems Neg Hx   . Hypotension Neg Hx   . Malignant hyperthermia Neg Hx   . Pseudochol deficiency Neg Hx     Social history:  reports that he quit smoking about 58 years ago. His smoking use included Cigarettes. His smokeless tobacco use includes Chew. He reports that he drinks alcohol. He reports that he does not use drugs.   No Known Allergies  Medications:  Prior to Admission medications   Medication Sig Start Date End Date Taking? Authorizing Provider  amLODipine (NORVASC) 10 MG tablet Take 10 mg by mouth daily.   Yes [provider]  aspirin 81 MG tablet Take 81 mg by mouth daily.   Yes [provider]  baclofen (LIORESAL) 10 MG tablet TAKE ONE (1) TABLET BY MOUTH 3 TIMES DAILY 03/30/17  Yes Kathrynn Ducking, MD  donepezil (ARICEPT) 10 MG tablet Take 1 tablet (10 mg total) by mouth at bedtime. 06/24/16  Yes Kathrynn Ducking, MD  finasteride (PROSCAR) 5 MG tablet Take 5 mg by mouth daily. Reported on 05/04/2016   Yes [provider]  fluticasone (FLONASE) 50 MCG/ACT nasal spray Place 1 spray into both nostrils daily.   Yes [provider]  gabapentin (NEURONTIN) 300 MG capsule Take 300 mg by mouth 3 (three) times daily.   Yes [provider]  losartan (COZAAR) 100 MG tablet Take 100 mg by mouth every morning.    Yes [provider]  omeprazole (PRILOSEC) 20 MG capsule Take 40 mg by mouth daily.   Yes [provider]  oxyCODONE-acetaminophen (PERCOCET) 10-325 MG tablet  12/02/16  Yes [provider]  pravastatin (PRAVACHOL) 10 MG tablet Take 10 mg by mouth every evening.    Yes [provider]  sildenafil (VIAGRA) 100 MG tablet Take 100 mg by mouth daily as needed for erectile dysfunction.   Yes [provider]  topiramate (TOPAMAX) 100 MG tablet Take 100 mg by mouth 2  (two) times daily.  12/08/16  Yes [provider]  traZODone (DESYREL) 100 MG tablet Take 100 mg by mouth at bedtime.   Yes [provider]  venlafaxine XR (EFFEXOR-XR) 150 MG 24 hr capsule Take 150 mg by mouth 2 (two) times daily.    Yes [provider]  vitamin B-12 (CYANOCOBALAMIN) 1000 MCG tablet Take 1,000 mcg by mouth daily.   Yes [provider]    ROS:  Out of a complete 14 system review of symptoms, the patient complains only of the following symptoms, and all other reviewed systems are negative.  Ringing in the ears, runny nose, drooling Eye discharge, eye redness, loss of vision, blurred vision Cough, wheezing Leg swelling Constipation Sleep apnea, daytime sleepiness  Blood pressure 128/82, pulse 68, height 6\' 3"  (1.905 m), weight (!) 303 lb 8 oz (137.7 kg).  Physical  Exam  General: The patient is alert and cooperative at the time of the examination. The patient is markedly obese.  Neuromuscular: The patient lacks about 35 of full lateral rotation of the cervical spine bilaterally.  Skin: 2+ edema is noted below the knees bilaterally..   Neurologic Exam  Mental status: The patient is alert and oriented x 3 at the time of the examination. The patient has apparent normal recent and remote memory, with an apparently normal attention span and concentration ability. Mini-Mental Status Examination done today shows a total score 28/30.   Cranial nerves: Facial symmetry is present. Speech is normal, no aphasia or dysarthria is noted. Extraocular movements are full. Visual fields are full.  Motor: The patient has good strength in all 4 extremities.  Sensory examination: Soft touch sensation is symmetric on the face, arms, and legs.  Coordination: The patient has good finger-nose-finger and heel-to-shin bilaterally.  Gait and station: The patient has a normal gait. Tandem gait is unsteady. Romberg is negative. No drift is  seen.  Reflexes: Deep tendon reflexes are symmetric.   Assessment/Plan:  1. Mild memory disturbance  2. Cervicogenic headache  The baclofen will be increased taking 10 mg one twice during the day and 2 at night. The patient may be able to get Lyrica through the [facility], I wrote a prescription for 100 mg 3 times daily. The patient will follow-up in 6 months. If he goes on Lyrica, he will discontinue the gabapentin.   Jill Alexanders MD 05/10/2017 3:05 PM  [facility] [address] [place], [place] [address]  Phone [phone] Fax [phone]

## 2017-07-11 NOTE — Progress Notes (Deleted)
Cardiology Office Note  Date: 07/11/2017   ID: Cody Velez, DOB 17-Nov-1946, MRN [id number]  PCP: Asencion Noble, MD  Primary Cardiologist: Cody Lesches, MD   No chief complaint on file.   History of Present Illness: Cody Velez is a 71 y.o. male last seen in July 2017.  Myoview study from 2016 is reviewed below, overall low risk.  Past Medical History:  Diagnosis Date  . Allergic rhinitis   . Arthritis   . Asthma   . B12 deficiency   . Cancer [facility])    prostate  . Cervicogenic headache 01/28/2016  . Chronic back pain   . Coronary atherosclerosis of native coronary artery    Mild nonobstructive CAD at catheterization January 2015  . Depression   . Essential hypertension, benign   . Falls   . GERD (gastroesophageal reflux disease)   . History of cerebrovascular disease 07/23/2015  . History of pneumonia 02/2011  . Hyperlipidemia   . Kidney stone   . Memory difficulty 07/23/2015  . OSA (obstructive sleep apnea)    CPAP - Dr. Gwenette Velez  . PTSD (post-traumatic stress disorder)    [place]  . Rectal bleeding     Past Surgical History:  Procedure Laterality Date  . BACK SURGERY  02/14/12   lumbar OR #7; "today redid L1L2; replaced screws; added bone from hip"  . BILATERAL KNEE ARTHROSCOPY    . COLONOSCOPY  10/15/2008   Dr. [name]: tubular adenoma   . COLONOSCOPY  12/17/2003   VZD:GLOVFI rectal and colon  . COLONOSCOPY N/A 09/05/2014   Procedure: COLONOSCOPY;  Surgeon: Daneil Dolin, MD;  [facility];  Service: Endoscopy;  Laterality: N/A;  7:30-rescheduled 9/17 to [place] notified pt  . CYSTOSCOPY WITH STENT PLACEMENT Right 01/27/2016   Procedure: CYSTOSCOPY WITH STENT PLACEMENT;  Surgeon: Cody Gallo, MD;  Location: [facility] ORS;  Service: Urology;  Laterality: Right;  . CYSTOSCOPY/RETROGRADE/URETEROSCOPY/STONE EXTRACTION WITH BASKET Right 01/27/2016   Procedure: CYSTOSCOPY, RIGHT RETROGRADE, RIGHT URETEROSCOPY, STONE EXTRACTION ;  Surgeon: Cody Gallo, MD;  Location: [facility] ORS;  Service: Urology;  Laterality: Right;  . ESOPHAGOGASTRODUODENOSCOPY  10/15/2008     Dr Rourk:Schatzki's ring status post dilation and disruption via 56 F Maloney dilator/ otherwise unremarkable esophagus, small hiatal hernia, multiple fundal gland polyps not manipulated, gastritis, negative H.pylori  . ESOPHAGOGASTRODUODENOSCOPY  06/21/02   EPP:IRJJO sliding hiatal hernia with mild changes of reflux esophagitis limited to gastroesophageal junction.  Noncritical ring at distal esophagus, 3 cm proximal to gastroesophageal junction/Antral gastritis  . [place]   "broke face playing softball"  . FRACTURE SURGERY     "left wrist; broke it; took spur off"  . HOLMIUM LASER APPLICATION Right 07/24/1659   Procedure: HOLMIUM LASER APPLICATION;  Surgeon: Cody Gallo, MD;  Location: [facility] ORS;  Service: Urology;  Laterality: Right;  . LEFT HEART CATHETERIZATION WITH CORONARY ANGIOGRAM N/A 12/27/2013   Procedure: LEFT HEART CATHETERIZATION WITH CORONARY ANGIOGRAM;  Surgeon: Cody M Martinique, MD;  Location: [facility] CATH LAB;  Service: Cardiovascular;  Laterality: N/A;  . neck epidural    . SHOULDER SURGERY Bilateral     Current Outpatient Prescriptions  Medication Sig Dispense Refill  . amLODipine (NORVASC) 10 MG tablet Take 10 mg by mouth daily.    Marland Kitchen aspirin 81 MG tablet Take 81 mg by mouth daily.    . baclofen (LIORESAL) 10 MG tablet One tablet in the morning and midday, 2 at night 360 each 3  .  donepezil (ARICEPT) 10 MG tablet Take 1 tablet (10 mg total) by mouth at bedtime. 90 tablet 3  . finasteride (PROSCAR) 5 MG tablet Take 5 mg by mouth daily. Reported on 05/04/2016    . fluticasone (FLONASE) 50 MCG/ACT nasal spray Place 1 spray into both nostrils daily.    Marland Kitchen gabapentin (NEURONTIN) 300 MG capsule Take 300 mg by mouth 3 (three) times daily.    Marland Kitchen losartan (COZAAR) 100 MG tablet Take 100 mg by mouth every morning.     Marland Kitchen omeprazole (PRILOSEC) 20 MG  capsule Take 40 mg by mouth daily.    Marland Kitchen oxyCODONE-acetaminophen (PERCOCET) 10-325 MG tablet     . pravastatin (PRAVACHOL) 10 MG tablet Take 10 mg by mouth every evening.     . pregabalin (LYRICA) 100 MG capsule Take 1 capsule (100 mg total) by mouth 3 (three) times daily. 270 capsule 1  . sildenafil (VIAGRA) 100 MG tablet Take 100 mg by mouth daily as needed for erectile dysfunction.    . topiramate (TOPAMAX) 100 MG tablet Take 100 mg by mouth 2 (two) times daily.     . traZODone (DESYREL) 100 MG tablet Take 100 mg by mouth at bedtime.    Marland Kitchen venlafaxine XR (EFFEXOR-XR) 150 MG 24 hr capsule Take 150 mg by mouth 2 (two) times daily.     . vitamin B-12 (CYANOCOBALAMIN) 1000 MCG tablet Take 1,000 mcg by mouth daily.     No current facility-administered medications for this visit.    Allergies:  Patient has no known allergies.   Social History: The patient  reports that he quit smoking about 58 years ago. His smoking use included Cigarettes. His smokeless tobacco use includes Chew. He reports that he drinks alcohol. He reports that he does not use drugs.   Family History: The patient's family history includes Asthma in his other; Breast cancer in his mother; CAD in his father; Colon cancer in his mother; Dementia in his paternal uncle; Emphysema in his father and maternal grandmother; Heart attack in his brother; Heart disease in his paternal grandfather; Heart failure in his father; Lung cancer in his father; Stroke in his maternal grandmother, mother, and sister.   ROS:  Please see the history of present illness. Otherwise, complete review of systems is positive for {NONE DEFAULTED:18576::"none"}.  All other systems are reviewed and negative.   Physical Exam: VS:  There were no vitals taken for this visit., BMI There is no height or weight on file to calculate BMI.  Wt Readings from Last 3 Encounters:  05/10/17 (!) 303 lb 8 oz (137.7 kg)  12/31/16 298 lb (135.2 kg)  09/30/16 293 lb (132.9 kg)      Overweight male, appears comfortable at rest, no active symptoms reported.  HEENT: Conjunctiva and lids normal, oropharynx clear.  Neck: Supple, no elevated JVP or carotid bruits, no thyromegaly.  Lungs: Clear to auscultation, nonlabored breathing at rest.  Cardiac: Regular rate and rhythm, S4 noted, soft systolic murmur, no pericardial rub.  Abdomen: Soft, nontender, bowel sounds present, no guarding or rebound.  Extremities: No pitting edema, distal pulses 2+.  ECG: I personally reviewed the tracing from 05/04/2016 which showed sinus rhythm with PAC, R' in lead V1, and left anterior fascicular block.  Recent Labwork:  05/04/2016: BUN 11; Creatinine, Ser 1.02; Hemoglobin 14.1; Platelets 373; Potassium 3.7; Sodium 140   Other Studies Reviewed Today:  Cardiac catheterization 12/27/2013: Coronary angiography: Coronary dominance: right  Left mainstem: Normal  Left anterior descending (LAD): The  LAD wraps around the apex. Diffuse irregularities up to 20%. The first diagonal is moderate in size and normal.  Left circumflex (LCx): Gives rise to 2 large OM branches. Mild luminal irregularities.  Right coronary artery (RCA): Small in caliber. Normal.  Left ventriculography: Left ventricular systolic function is normal, LVEF is estimated at 55-65%, there is no significant mitral regurgitation   Final Conclusions:  1. Mild nonobstructive CAD 2. Normal LV function.  Lexiscan Cardiolite 10/10/2015:  There was no ST segment deviation noted during stress.  The study is normal.  This is a low risk study.  The left ventricular ejection fraction is normal (55-65%).  Normal resting and stress perfusion.  No RWMA;s EF 57%  Assessment and Plan:    Current medicines were reviewed with the patient today.  No orders of the defined types were placed in this encounter.   Disposition:  Signed, [name], MD, [facility] 07/11/2017 2:14 PM    [facility] [address], [place], [place] [address] Phone: [phone]; Fax: [phone]

## 2017-07-12 ENCOUNTER — Ambulatory Visit: Payer: Federal, State, Local not specified - PPO | Admitting: Cardiology

## 2017-08-17 DIAGNOSIS — M542 Cervicalgia: Secondary | ICD-10-CM | POA: Diagnosis not present

## 2017-08-25 ENCOUNTER — Other Ambulatory Visit: Payer: Self-pay | Admitting: Neurological Surgery

## 2017-08-25 DIAGNOSIS — M542 Cervicalgia: Secondary | ICD-10-CM

## 2017-09-05 ENCOUNTER — Ambulatory Visit
Admission: RE | Admit: 2017-09-05 | Discharge: 2017-09-05 | Disposition: A | Discharge status: Home / Self Care | Payer: Medicare Other | Source: Ambulatory Visit | Attending: Neurological Surgery | Admitting: Neurological Surgery

## 2017-09-05 DIAGNOSIS — M4802 Spinal stenosis, cervical region: Secondary | ICD-10-CM | POA: Diagnosis not present

## 2017-09-05 DIAGNOSIS — M542 Cervicalgia: Secondary | ICD-10-CM

## 2017-09-07 DIAGNOSIS — M47812 Spondylosis without myelopathy or radiculopathy, cervical region: Secondary | ICD-10-CM | POA: Diagnosis not present

## 2017-09-15 DIAGNOSIS — Z79899 Other long term (current) drug therapy: Secondary | ICD-10-CM | POA: Diagnosis not present

## 2017-09-15 DIAGNOSIS — Z5181 Encounter for therapeutic drug level monitoring: Secondary | ICD-10-CM | POA: Diagnosis not present

## 2017-09-20 DIAGNOSIS — C61 Malignant neoplasm of prostate: Secondary | ICD-10-CM | POA: Diagnosis not present

## 2017-09-27 ENCOUNTER — Ambulatory Visit (INDEPENDENT_AMBULATORY_CARE_PROVIDER_SITE_OTHER): Payer: Medicare Other | Admitting: Urology

## 2017-09-27 DIAGNOSIS — N5201 Erectile dysfunction due to arterial insufficiency: Secondary | ICD-10-CM

## 2017-09-27 DIAGNOSIS — R351 Nocturia: Secondary | ICD-10-CM

## 2017-09-27 DIAGNOSIS — C61 Malignant neoplasm of prostate: Secondary | ICD-10-CM | POA: Diagnosis not present

## 2017-09-27 DIAGNOSIS — N401 Enlarged prostate with lower urinary tract symptoms: Secondary | ICD-10-CM | POA: Diagnosis not present

## 2017-10-05 DIAGNOSIS — K219 Gastro-esophageal reflux disease without esophagitis: Secondary | ICD-10-CM | POA: Diagnosis not present

## 2017-10-05 DIAGNOSIS — I10 Essential (primary) hypertension: Secondary | ICD-10-CM | POA: Diagnosis not present

## 2017-10-05 DIAGNOSIS — Z125 Encounter for screening for malignant neoplasm of prostate: Secondary | ICD-10-CM | POA: Diagnosis not present

## 2017-10-05 DIAGNOSIS — F329 Major depressive disorder, single episode, unspecified: Secondary | ICD-10-CM | POA: Diagnosis not present

## 2017-10-05 DIAGNOSIS — R51 Headache: Secondary | ICD-10-CM | POA: Diagnosis not present

## 2017-10-05 DIAGNOSIS — E785 Hyperlipidemia, unspecified: Secondary | ICD-10-CM | POA: Diagnosis not present

## 2017-10-13 DIAGNOSIS — I10 Essential (primary) hypertension: Secondary | ICD-10-CM | POA: Diagnosis not present

## 2017-10-13 DIAGNOSIS — G4733 Obstructive sleep apnea (adult) (pediatric): Secondary | ICD-10-CM | POA: Diagnosis not present

## 2017-10-13 DIAGNOSIS — M542 Cervicalgia: Secondary | ICD-10-CM | POA: Insufficient documentation

## 2017-10-13 DIAGNOSIS — E785 Hyperlipidemia, unspecified: Secondary | ICD-10-CM | POA: Diagnosis not present

## 2017-10-20 ENCOUNTER — Ambulatory Visit (INDEPENDENT_AMBULATORY_CARE_PROVIDER_SITE_OTHER): Payer: Medicare Other | Admitting: Cardiology

## 2017-10-20 ENCOUNTER — Encounter: Payer: Self-pay | Admitting: Cardiology

## 2017-10-20 VITALS — BP 140/88 | HR 76 | Ht 75.0 in | Wt 324.0 lb

## 2017-10-20 DIAGNOSIS — E782 Mixed hyperlipidemia: Secondary | ICD-10-CM

## 2017-10-20 DIAGNOSIS — I2510 Atherosclerotic heart disease of native coronary artery without angina pectoris: Secondary | ICD-10-CM | POA: Diagnosis not present

## 2017-10-20 DIAGNOSIS — I10 Essential (primary) hypertension: Secondary | ICD-10-CM | POA: Diagnosis not present

## 2017-10-20 NOTE — Patient Instructions (Signed)
Medication Instructions:  Your physician recommends that you continue on your current medications as directed. Please refer to the Current Medication list given to you today.  Labwork: NONE  Testing/Procedures: NONE  Follow-Up: Your physician wants you to follow-up in: 1 YEAR WITH DR. MCDOWELL. You will receive a reminder letter in the mail two months in advance. If you don't receive a letter, please call our office to schedule the follow-up appointment.  Any Other Special Instructions Will Be Listed Below (If Applicable).  If you need a refill on your cardiac medications before your next appointment, please call your pharmacy. 

## 2017-10-20 NOTE — Progress Notes (Signed)
Cardiology Office Note  Date: 10/20/2017   ID: Cody Velez, DOB [birth year], MRN [id number]  PCP: Asencion Noble, MD  Primary Cardiologist: Rozann Lesches, MD   Chief Complaint  Patient presents with  . Coronary Artery Disease    History of Present Illness: Cody Velez is a 71 y.o. male last seen in July 2017.  He presents for a routine follow-up visit.  He does not report any angina symptoms.  He has gained a significant amount of weight over the last 1-2 years.  He has been more short of breath, but admits that he is not exercising and also deals with a lot of chronic pain leading to spinal arthritis and disc problems.  He states that he is pending placement of a spinal stimulator for pain control.  He had a recent physical with Dr. Willey Blade and had lab work obtained, we are requesting the results.  He has had some foot and ankle swelling that is dependent, better in the morning when he gets up.  Potentially related to Norvasc.  We discussed reducing salt in the diet, also weight loss.  I personally reviewed his ECG today which shows sinus rhythm with PACs.  Last ischemic testing was in 2016 as outlined below.  Past Medical History:  Diagnosis Date  . Allergic rhinitis   . Arthritis   . Asthma   . B12 deficiency   . Cancer [facility])    prostate  . Cervicogenic headache 01/28/2016  . Chronic back pain   . Coronary atherosclerosis of native coronary artery    Mild nonobstructive CAD at catheterization January 2015  . Depression   . Essential hypertension, benign   . Falls   . GERD (gastroesophageal reflux disease)   . History of cerebrovascular disease 07/23/2015  . History of pneumonia 02/2011  . Hyperlipidemia   . Kidney stone   . Memory difficulty 07/23/2015  . OSA (obstructive sleep apnea)    CPAP - Dr. [name]  . PTSD (post-traumatic stress disorder)    [place]  . Rectal bleeding     Past Surgical History:  Procedure Laterality Date  . BACK SURGERY  02/14/12   lumbar OR #7; "today redid L1L2; replaced screws; added bone from hip"  . BILATERAL KNEE ARTHROSCOPY    . COLONOSCOPY  10/15/2008   Dr. [name]: tubular adenoma   . COLONOSCOPY  12/17/2003   VVO:HYWVPX rectal and colon  . COLONOSCOPY N/A 09/05/2014   Procedure: COLONOSCOPY;  Surgeon: Daneil Dolin, MD;  [facility];  Service: Endoscopy;  Laterality: N/A;  7:30-rescheduled 9/17 to [place] notified pt  . CYSTOSCOPY WITH STENT PLACEMENT Right 01/27/2016   Procedure: CYSTOSCOPY WITH STENT PLACEMENT;  Surgeon: [name], MD;  Location: [facility] ORS;  Service: Urology;  Laterality: Right;  . CYSTOSCOPY/RETROGRADE/URETEROSCOPY/STONE EXTRACTION WITH BASKET Right 01/27/2016   Procedure: CYSTOSCOPY, RIGHT RETROGRADE, RIGHT URETEROSCOPY, STONE EXTRACTION ;  Surgeon: [name], MD;  Location: [facility] OR[facility];  Service: Urology;  Laterality: Right;  . ESOPHAGOGASTRODUODENOSCOPY  10/15/2008     Dr Rourk:Schatzki's ring status post dilation and disruption via 71 F Maloney dilator/ otherwise unremarkable esophagus, small hiatal hernia, multiple fundal gland polyps not manipulated, gastritis, negative H.pylori  . ESOPHAGOGASTRODUODENOSCOPY  06/21/02   TGG:YIRSW sliding hiatal hernia with mild changes of reflux esophagitis limited to gastroesophageal junction.  Noncritical ring at distal esophagus, 3 cm proximal to gastroesophageal junction/Antral gastritis  . [place]   "broke face playing softball"  .  FRACTURE SURGERY     "left wrist; broke it; took spur off"  . HOLMIUM LASER APPLICATION Right 02/21/7424   Procedure: HOLMIUM LASER APPLICATION;  Surgeon: [name], MD;  Location: [facility] OR[facility];  Service: Urology;  Laterality: Right;  . LEFT HEART CATHETERIZATION WITH CORONARY ANGIOGRAM N/A 12/27/2013   Procedure: LEFT HEART CATHETERIZATION WITH CORONARY ANGIOGRAM;  Surgeon: [name], MD;  Location: [facility] CATH LAB;  Service: Cardiovascular;  Laterality: N/A;  . neck  epidural    . SHOULDER SURGERY Bilateral     Current Outpatient Prescriptions  Medication Sig Dispense Refill  . amLODipine (NORVASC) 10 MG tablet Take 10 mg by mouth daily.    Marland Kitchen aspirin 81 MG tablet Take 81 mg by mouth daily.    . baclofen (LIORESAL) 10 MG tablet Take 10 mg by mouth 3 (three) times daily.    Marland Kitchen docusate sodium (COLACE) 100 MG capsule Take 200 mg by mouth 2 (two) times daily.    Marland Kitchen donepezil (ARICEPT) 10 MG tablet Take 1 tablet (10 mg total) by mouth at bedtime. 90 tablet 3  . finasteride (PROSCAR) 5 MG tablet Take 5 mg by mouth daily. Reported on 05/04/2016    . fluticasone (FLONASE) 50 MCG/ACT nasal spray Place 1 spray into both nostrils as needed.     . gabapentin (NEURONTIN) 300 MG capsule Take 300 mg by mouth 3 (three) times daily.    Marland Kitchen ibuprofen (ADVIL,MOTRIN) 600 MG tablet Take 600 mg by mouth 3 (three) times daily.    Marland Kitchen losartan (COZAAR) 100 MG tablet Take 100 mg by mouth every morning.     Marland Kitchen omeprazole (PRILOSEC) 20 MG capsule Take 40 mg by mouth every morning.     Marland Kitchen oxyCODONE-acetaminophen (PERCOCET/ROXICET) 5-325 MG tablet Take 2 tablets by mouth every 6 (six) hours as needed for severe pain.    . pravastatin (PRAVACHOL) 10 MG tablet Take 10 mg by mouth every evening.     . sildenafil (VIAGRA) 100 MG tablet Take 100 mg by mouth daily as needed for erectile dysfunction.    . traZODone (DESYREL) 100 MG tablet Take 100 mg by mouth at bedtime.    Marland Kitchen venlafaxine XR (EFFEXOR-XR) 150 MG 24 hr capsule Take 150 mg by mouth 2 (two) times daily.     . vitamin B-12 (CYANOCOBALAMIN) 1000 MCG tablet Take 1,000 mcg by mouth daily.     No current facility-administered medications for this visit.    Allergies:  Patient has no known allergies.   Social History: The patient  reports that he quit smoking about 58 years ago. His smoking use included Cigarettes. His smokeless tobacco use includes Chew. He reports that he drinks alcohol. He reports that he does not use drugs.   ROS:   Please see the history of present illness. Otherwise, complete review of systems is positive for memory loss.  All other systems are reviewed and negative.   Physical Exam: VS:  BP 140/88   Pulse 76   Ht 6\' 3"  (1.905 m)   Wt (!) 324 lb (147 kg)   SpO2 96%   BMI 40.50 kg/m , BMI Body mass index is 40.5 kg/m.  Wt Readings from Last 3 Encounters:  10/20/17 (!) 324 lb (147 kg)  05/10/17 (!) 303 lb 8 oz (137.7 kg)  12/31/16 298 lb (135.2 kg)    General: Morbidly obese male, appears comfortable at rest. HEENT: Conjunctiva and lids normal, oropharynx clear. Neck: Supple, increased girth without obvious elevated JVP or carotid bruits,  no thyromegaly. Lungs: Clear to auscultation, nonlabored breathing at rest. Cardiac: Regular rate and rhythm, S4, soft systolic murmur, no pericardial rub. Abdomen: Obese, nontender, bowel sounds present, no guarding or rebound. Extremities: Mild lower leg edema bilaterally, distal pulses 2+. Skin: Warm and dry. Musculoskeletal: No kyphosis. Neuropsychiatric: Alert and oriented x3, affect grossly appropriate.  ECG: I personally reviewed the tracing from 05/04/2016 which showed sinus rhythm with left anterior fascicular block.  Recent Labwork:  May 2017: Potassium 3.7, BUN 11, creatinine 1.0, hemoglobin 14.1, platelets 373  Other Studies Reviewed Today:  Lexiscan Cardiolite 10/10/2015:  There was no ST segment deviation noted during stress.  The study is normal.  This is a low risk study.  The left ventricular ejection fraction is normal (55-65%).   Normal resting and stress perfusion.   No RWMA;s EF 57%  Assessment and Plan:  1.  Mild coronary atherosclerosis by cardiac catheterization with Cardiolite study from 2016 showing no evidence of ischemia.  He does not report angina at this time.  To new aspirin and statin therapy.  2.  Morbid obesity.  He has gained a significant amount of weight over the last few years.  We discussed diet and  weight loss, sodium restriction as well.  I expect this is also contributing to his shortness of breath and potentially to some degree his ankle and foot swelling.  3.  Essential hypertension.  Norvasc could also be leading to some degree of leg edema.  Continue with same dose for now.  If he does not improve with further weight loss and increased activity, could consider reducing dose of Norvasc and adding chlorthalidone.  4.  Hyperlipidemia, continues on his Pravachol with follow-up per Dr. Willey Blade.  Requesting most recent lab work.  Current medicines were reviewed with the patient today.   Orders Placed This Encounter  Procedures  . EKG 12-Lead    Disposition: Follow-up in 1 year, sooner if needed.  Signed, [name], MD, [facility] 10/20/2017 3:52 PM    [facility] [place], [place], [place] [address] Phone: [phone]; Fax: [phone]

## 2017-11-17 ENCOUNTER — Ambulatory Visit (INDEPENDENT_AMBULATORY_CARE_PROVIDER_SITE_OTHER): Payer: Medicare Other | Admitting: Neurology

## 2017-11-17 ENCOUNTER — Encounter: Payer: Self-pay | Admitting: Neurology

## 2017-11-17 VITALS — BP 173/95 | HR 80 | Ht 75.0 in | Wt 323.0 lb

## 2017-11-17 DIAGNOSIS — G4486 Cervicogenic headache: Secondary | ICD-10-CM

## 2017-11-17 DIAGNOSIS — I2510 Atherosclerotic heart disease of native coronary artery without angina pectoris: Secondary | ICD-10-CM

## 2017-11-17 DIAGNOSIS — R51 Headache: Secondary | ICD-10-CM

## 2017-11-17 DIAGNOSIS — R413 Other amnesia: Secondary | ICD-10-CM | POA: Diagnosis not present

## 2017-11-17 MED ORDER — GABAPENTIN 300 MG PO CAPS: 600.0000 mg | ORAL_CAPSULE | Freq: Three times a day (TID) | ORAL | 3 refills | Status: DC

## 2017-11-17 MED ORDER — GABAPENTIN 300 MG PO CAPS: 900.0000 mg | ORAL_CAPSULE | Freq: Three times a day (TID) | ORAL | 3 refills | Status: DC

## 2017-11-17 MED ORDER — MEMANTINE HCL 5 MG PO TABS: ORAL_TABLET | ORAL | 0 refills | Status: DC

## 2017-11-17 NOTE — Patient Instructions (Signed)
   We will go up on the gabapentin to 600 mg three times a day.

## 2017-11-17 NOTE — Progress Notes (Signed)
Reason for visit: Cervicogenic headache, memory disturbance  Cody Velez is an 71 y.o. male  History of present illness:  Cody Velez is a 71 year old  left-handed white male with a history of a mild memory disturbance.  The patient is concerned his memory is worsening gradually over time, he is having increasing problems with short-term memory and remembering names for people.  The patient has ongoing neck pain, he is followed through a pain center, he may consider getting a spinal stimulator in the near future.  He remains on gabapentin taking 600 mg 3 times daily.  He was unable to get Lyrica through the [facility].  He does report some mild gait instability, he has not had any falls.  He returns to the office today for an evaluation.  Past Medical History:  Diagnosis Date  . Allergic rhinitis   . Arthritis   . Asthma   . B12 deficiency   . Cancer [facility])    prostate  . Cervicogenic headache 01/28/2016  . Chronic back pain   . Coronary atherosclerosis of native coronary artery    Mild nonobstructive CAD at catheterization January 2015  . Depression   . Essential hypertension, benign   . Falls   . GERD (gastroesophageal reflux disease)   . History of cerebrovascular disease 07/23/2015  . History of pneumonia 02/2011  . Hyperlipidemia   . Kidney stone   . Memory difficulty 07/23/2015  . OSA (obstructive sleep apnea)    CPAP - Dr. [name]  . PTSD (post-traumatic stress disorder)    [place]  . Rectal bleeding     Past Surgical History:  Procedure Laterality Date  . BACK SURGERY  02/14/12   lumbar OR #7; "today redid L1L2; replaced screws; added bone from hip"  . BILATERAL KNEE ARTHROSCOPY    . COLONOSCOPY  10/15/2008   Dr. [name]: tubular adenoma   . COLONOSCOPY  12/17/2003   DTO:IZTIWP rectal and colon  . COLONOSCOPY N/A 09/05/2014   Procedure: COLONOSCOPY;  Surgeon: Daneil Dolin, MD;  Location: [facility] ENDO SUITE;  Service: Endoscopy;  Laterality: N/A;  7:30-rescheduled  9/17 to [place] notified pt  . CYSTOSCOPY WITH STENT PLACEMENT Right 01/27/2016   Procedure: CYSTOSCOPY WITH STENT PLACEMENT;  Surgeon: [name], MD;  Location: [facility] ORS;  Service: Urology;  Laterality: Right;  . CYSTOSCOPY/RETROGRADE/URETEROSCOPY/STONE EXTRACTION WITH BASKET Right 01/27/2016   Procedure: CYSTOSCOPY, RIGHT RETROGRADE, RIGHT URETEROSCOPY, STONE EXTRACTION ;  Surgeon: [name], MD;  Location: [facility] ORS;  Service: Urology;  Laterality: Right;  . ESOPHAGOGASTRODUODENOSCOPY  10/15/2008     Dr Rourk:Schatzki's ring status post dilation and disruption via 59 F Maloney dilator/ otherwise unremarkable esophagus, small hiatal hernia, multiple fundal gland polyps not manipulated, gastritis, negative H.pylori  . ESOPHAGOGASTRODUODENOSCOPY  06/21/02   YKD:XIPJA sliding hiatal hernia with mild changes of reflux esophagitis limited to gastroesophageal junction.  Noncritical ring at distal esophagus, 3 cm proximal to gastroesophageal junction/Antral gastritis  . [place]   "broke face playing softball"  . FRACTURE SURGERY     "left wrist; broke it; took spur off"  . HOLMIUM LASER APPLICATION Right 01/24/538   Procedure: HOLMIUM LASER APPLICATION;  Surgeon: [name], MD;  Location: [facility] ORS;  Service: Urology;  Laterality: Right;  . LEFT HEART CATHETERIZATION WITH CORONARY ANGIOGRAM N/A 12/27/2013   Procedure: LEFT HEART CATHETERIZATION WITH CORONARY ANGIOGRAM;  Surgeon: [name], MD;  Location: [facility] CATH LAB;  Service: Cardiovascular;  Laterality: N/A;  .  neck epidural    . SHOULDER SURGERY Bilateral     Family History  Problem Relation Age of Onset  . Emphysema Father   . Heart failure Father   . Lung cancer Father   . CAD Father   . Colon cancer Mother   . Stroke Mother   . Breast cancer Mother   . Stroke Sister   . Heart attack Brother   . Dementia Paternal Uncle   . Emphysema Maternal Grandmother   . Stroke Maternal Grandmother     . Asthma Other        grandson  . Heart disease Paternal Grandfather   . Anesthesia problems Neg Hx   . Hypotension Neg Hx   . Malignant hyperthermia Neg Hx   . Pseudochol deficiency Neg Hx     Social history:  reports that he quit smoking about 58 years ago. His smoking use included cigarettes. His smokeless tobacco use includes chew. He reports that he drinks alcohol. He reports that he does not use drugs.   No Known Allergies  Medications:  Prior to Admission medications   Medication Sig Start Date End Date Taking? Authorizing Provider  amLODipine (NORVASC) 10 MG tablet Take 10 mg by mouth daily.   Yes [provider]  aspirin 81 MG tablet Take 81 mg by mouth daily.   Yes [provider]  baclofen (LIORESAL) 10 MG tablet Take 10 mg by mouth 3 (three) times daily.   Yes [provider]  docusate sodium (COLACE) 100 MG capsule Take 200 mg by mouth 2 (two) times daily.   Yes [provider]  donepezil (ARICEPT) 10 MG tablet Take 1 tablet (10 mg total) by mouth at bedtime. 06/24/16  Yes Kathrynn Ducking, MD  finasteride (PROSCAR) 5 MG tablet Take 5 mg by mouth daily. Reported on 05/04/2016   Yes [provider]  fluticasone (FLONASE) 50 MCG/ACT nasal spray Place 1 spray into both nostrils as needed.    Yes [provider]  gabapentin (NEURONTIN) 300 MG capsule Take 300 mg by mouth 3 (three) times daily.   Yes [provider]  ibuprofen (ADVIL,MOTRIN) 600 MG tablet Take 600 mg by mouth 3 (three) times daily.   Yes [provider]  losartan (COZAAR) 100 MG tablet Take 100 mg by mouth every morning.    Yes [provider]  omeprazole (PRILOSEC) 20 MG capsule Take 40 mg by mouth every morning.    Yes [provider]  oxyCODONE-acetaminophen (PERCOCET/ROXICET) 5-325 MG tablet Take 2 tablets by mouth every 6 (six) hours as needed for severe pain.   Yes [provider]  pravastatin (PRAVACHOL) 10 MG  tablet Take 10 mg by mouth every evening.    Yes [provider]  sildenafil (VIAGRA) 100 MG tablet Take 100 mg by mouth daily as needed for erectile dysfunction.   Yes [provider]  traZODone (DESYREL) 100 MG tablet Take 100 mg by mouth at bedtime.   Yes [provider]  venlafaxine XR (EFFEXOR-XR) 150 MG 24 hr capsule Take 150 mg by mouth 2 (two) times daily.    Yes [provider]  vitamin B-12 (CYANOCOBALAMIN) 1000 MCG tablet Take 1,000 mcg by mouth daily.   Yes [provider]    ROS:  Out of a complete 14 system review of symptoms, the patient complains only of the following symptoms, and all other reviewed systems are negative.  Hearing loss, ringing in the ears, difficulty swallowing Eye discharge, blurred  vision Wheezing, shortness of breath Excessive eating Sleep apnea, snoring Joint pain, back pain, neck discomfort Itching of the arms Bruising easily Agitation, confusion  Blood pressure (!) 173/95, pulse 80, height 6\' 3"  (1.905 m), weight (!) 323 lb (146.5 kg).  Physical Exam  General: The patient is alert and cooperative at the time of the examination.  Patient is markedly obese.  Skin: 2-3+ edema below the knees is seen bilaterally.   Neurologic Exam  Mental status: The patient is alert and oriented x 3 at the time of the examination. The patient has apparent normal recent and remote memory, with an apparently normal attention span and concentration ability.  Mini-Mental status examination done today shows a total score of 29/30.   Cranial nerves: Facial symmetry is present. Speech is normal, no aphasia or dysarthria is noted. Extraocular movements are full. Visual fields are full.  Motor: The patient has good strength in all 4 extremities.  Sensory examination: Soft touch sensation is symmetric on the face, arms, and legs.  Coordination: The patient has good finger-nose-finger and heel-to-shin bilaterally.  Gait  and station: The patient has a normal gait. Tandem gait is unsteady.  Romberg is negative, but is unsteady. No drift is seen.  Reflexes: Deep tendon reflexes are symmetric, but are depressed.   Assessment/Plan:  1.  Memory disturbance  2.  Cervical spondylosis, cervicogenic headache  The gabapentin will be increased to 900 mg 3 times daily, if he does not have good pain control, he should be able to get Lyrica through the [facility].  The patient has noted good improvement with Lyrica in the past.  The patient will be placed on Namenda for the memory, he will call me in 1 month if he tolerates the titration pack.  He will follow-up in 6 months.  He will remain on the donepezil.  Jill Alexanders MD 11/17/2017 4:08 PM  [facility] [address] [place], [place] [address]  Phone [phone] Fax [phone]

## 2017-11-18 ENCOUNTER — Telehealth: Payer: Self-pay | Admitting: *Deleted

## 2017-11-18 NOTE — Telephone Encounter (Signed)
Faxed printed/signed rx memantine to [employer] at [phone].

## 2017-12-19 ENCOUNTER — Other Ambulatory Visit: Payer: Self-pay | Admitting: Neurology

## 2017-12-21 MED ORDER — MEMANTINE HCL 10 MG PO TABS: 10.0000 mg | ORAL_TABLET | Freq: Two times a day (BID) | ORAL | 3 refills | Status: DC

## 2017-12-21 NOTE — Telephone Encounter (Signed)
Pt is wanting a refill for memantine (NAMENDA) 5 MG tablet

## 2017-12-21 NOTE — Telephone Encounter (Signed)
Called and spoke with patient. He is taking 2 tablets twice daily now. Tolerating well with no SE. Advised we will call in new rx for maintenance dose. 10mg  tablet, take twice daily. He verbalized understanding.

## 2018-01-17 DIAGNOSIS — N400 Enlarged prostate without lower urinary tract symptoms: Secondary | ICD-10-CM | POA: Insufficient documentation

## 2018-01-17 DIAGNOSIS — G4700 Insomnia, unspecified: Secondary | ICD-10-CM | POA: Insufficient documentation

## 2018-01-26 DIAGNOSIS — I10 Essential (primary) hypertension: Secondary | ICD-10-CM | POA: Diagnosis not present

## 2018-01-26 DIAGNOSIS — R062 Wheezing: Secondary | ICD-10-CM | POA: Diagnosis not present

## 2018-01-26 DIAGNOSIS — R413 Other amnesia: Secondary | ICD-10-CM | POA: Diagnosis not present

## 2018-01-26 DIAGNOSIS — Z6841 Body Mass Index (BMI) 40.0 and over, adult: Secondary | ICD-10-CM | POA: Diagnosis not present

## 2018-03-01 DIAGNOSIS — R0789 Other chest pain: Secondary | ICD-10-CM | POA: Diagnosis not present

## 2018-03-11 DIAGNOSIS — Z8546 Personal history of malignant neoplasm of prostate: Secondary | ICD-10-CM | POA: Diagnosis not present

## 2018-03-11 DIAGNOSIS — S199XXA Unspecified injury of neck, initial encounter: Secondary | ICD-10-CM | POA: Diagnosis not present

## 2018-03-11 DIAGNOSIS — F0390 Unspecified dementia without behavioral disturbance: Secondary | ICD-10-CM | POA: Diagnosis not present

## 2018-03-11 DIAGNOSIS — R55 Syncope and collapse: Secondary | ICD-10-CM | POA: Diagnosis not present

## 2018-03-11 DIAGNOSIS — I10 Essential (primary) hypertension: Secondary | ICD-10-CM | POA: Diagnosis not present

## 2018-03-11 DIAGNOSIS — S0990XA Unspecified injury of head, initial encounter: Secondary | ICD-10-CM | POA: Diagnosis not present

## 2018-03-11 DIAGNOSIS — M542 Cervicalgia: Secondary | ICD-10-CM | POA: Diagnosis not present

## 2018-03-11 DIAGNOSIS — E871 Hypo-osmolality and hyponatremia: Secondary | ICD-10-CM | POA: Diagnosis not present

## 2018-03-11 DIAGNOSIS — I951 Orthostatic hypotension: Secondary | ICD-10-CM | POA: Diagnosis not present

## 2018-03-11 DIAGNOSIS — M545 Low back pain: Secondary | ICD-10-CM | POA: Diagnosis not present

## 2018-03-11 DIAGNOSIS — J45909 Unspecified asthma, uncomplicated: Secondary | ICD-10-CM | POA: Diagnosis not present

## 2018-03-11 DIAGNOSIS — K219 Gastro-esophageal reflux disease without esophagitis: Secondary | ICD-10-CM | POA: Diagnosis not present

## 2018-03-11 DIAGNOSIS — R0789 Other chest pain: Secondary | ICD-10-CM | POA: Diagnosis not present

## 2018-03-11 DIAGNOSIS — R4182 Altered mental status, unspecified: Secondary | ICD-10-CM | POA: Diagnosis not present

## 2018-03-11 DIAGNOSIS — E785 Hyperlipidemia, unspecified: Secondary | ICD-10-CM | POA: Diagnosis not present

## 2018-03-11 DIAGNOSIS — Z9181 History of falling: Secondary | ICD-10-CM | POA: Diagnosis not present

## 2018-03-11 DIAGNOSIS — R079 Chest pain, unspecified: Secondary | ICD-10-CM | POA: Diagnosis not present

## 2018-03-11 DIAGNOSIS — N179 Acute kidney failure, unspecified: Secondary | ICD-10-CM | POA: Diagnosis not present

## 2018-03-11 DIAGNOSIS — R51 Headache: Secondary | ICD-10-CM | POA: Diagnosis not present

## 2018-03-11 DIAGNOSIS — G4733 Obstructive sleep apnea (adult) (pediatric): Secondary | ICD-10-CM | POA: Diagnosis not present

## 2018-03-12 DIAGNOSIS — R55 Syncope and collapse: Secondary | ICD-10-CM | POA: Diagnosis not present

## 2018-03-12 DIAGNOSIS — I951 Orthostatic hypotension: Secondary | ICD-10-CM | POA: Diagnosis not present

## 2018-03-12 DIAGNOSIS — R4182 Altered mental status, unspecified: Secondary | ICD-10-CM | POA: Diagnosis not present

## 2018-03-12 DIAGNOSIS — R0789 Other chest pain: Secondary | ICD-10-CM | POA: Diagnosis not present

## 2018-03-13 ENCOUNTER — Encounter: Payer: Self-pay | Admitting: Cardiology

## 2018-03-13 DIAGNOSIS — E785 Hyperlipidemia, unspecified: Secondary | ICD-10-CM | POA: Diagnosis not present

## 2018-03-13 DIAGNOSIS — I10 Essential (primary) hypertension: Secondary | ICD-10-CM | POA: Diagnosis not present

## 2018-03-13 DIAGNOSIS — R079 Chest pain, unspecified: Secondary | ICD-10-CM | POA: Diagnosis not present

## 2018-03-13 DIAGNOSIS — G8929 Other chronic pain: Secondary | ICD-10-CM | POA: Diagnosis not present

## 2018-03-15 ENCOUNTER — Emergency Department (HOSPITAL_COMMUNITY): Payer: Medicare Other

## 2018-03-15 ENCOUNTER — Encounter (HOSPITAL_COMMUNITY): Payer: Self-pay | Admitting: *Deleted

## 2018-03-15 ENCOUNTER — Other Ambulatory Visit: Payer: Self-pay

## 2018-03-15 ENCOUNTER — Inpatient Hospital Stay (HOSPITAL_COMMUNITY)
Admission: EM | Admit: 2018-03-15 | Discharge: 2018-03-30 | DRG: 853 | Disposition: A | Discharge status: Rehabilitation Facility | Payer: Medicare Other | Attending: Internal Medicine | Admitting: Internal Medicine

## 2018-03-15 DIAGNOSIS — R531 Weakness: Secondary | ICD-10-CM

## 2018-03-15 DIAGNOSIS — J9601 Acute respiratory failure with hypoxia: Secondary | ICD-10-CM | POA: Diagnosis present

## 2018-03-15 DIAGNOSIS — M4644 Discitis, unspecified, thoracic region: Secondary | ICD-10-CM

## 2018-03-15 DIAGNOSIS — B9561 Methicillin susceptible Staphylococcus aureus infection as the cause of diseases classified elsewhere: Secondary | ICD-10-CM

## 2018-03-15 DIAGNOSIS — Z8249 Family history of ischemic heart disease and other diseases of the circulatory system: Secondary | ICD-10-CM

## 2018-03-15 DIAGNOSIS — Z8546 Personal history of malignant neoplasm of prostate: Secondary | ICD-10-CM

## 2018-03-15 DIAGNOSIS — R140 Abdominal distension (gaseous): Secondary | ICD-10-CM

## 2018-03-15 DIAGNOSIS — Z981 Arthrodesis status: Secondary | ICD-10-CM

## 2018-03-15 DIAGNOSIS — Z8673 Personal history of transient ischemic attack (TIA), and cerebral infarction without residual deficits: Secondary | ICD-10-CM

## 2018-03-15 DIAGNOSIS — G253 Myoclonus: Secondary | ICD-10-CM | POA: Diagnosis not present

## 2018-03-15 DIAGNOSIS — G894 Chronic pain syndrome: Secondary | ICD-10-CM | POA: Diagnosis present

## 2018-03-15 DIAGNOSIS — Z720 Tobacco use: Secondary | ICD-10-CM

## 2018-03-15 DIAGNOSIS — R404 Transient alteration of awareness: Secondary | ICD-10-CM | POA: Diagnosis not present

## 2018-03-15 DIAGNOSIS — M549 Dorsalgia, unspecified: Secondary | ICD-10-CM

## 2018-03-15 DIAGNOSIS — Z823 Family history of stroke: Secondary | ICD-10-CM

## 2018-03-15 DIAGNOSIS — E876 Hypokalemia: Secondary | ICD-10-CM | POA: Diagnosis present

## 2018-03-15 DIAGNOSIS — G8929 Other chronic pain: Secondary | ICD-10-CM | POA: Diagnosis present

## 2018-03-15 DIAGNOSIS — G061 Intraspinal abscess and granuloma: Secondary | ICD-10-CM | POA: Diagnosis not present

## 2018-03-15 DIAGNOSIS — R0603 Acute respiratory distress: Secondary | ICD-10-CM

## 2018-03-15 DIAGNOSIS — R55 Syncope and collapse: Secondary | ICD-10-CM | POA: Diagnosis not present

## 2018-03-15 DIAGNOSIS — Z6838 Body mass index (BMI) 38.0-38.9, adult: Secondary | ICD-10-CM

## 2018-03-15 DIAGNOSIS — F329 Major depressive disorder, single episode, unspecified: Secondary | ICD-10-CM | POA: Diagnosis present

## 2018-03-15 DIAGNOSIS — E785 Hyperlipidemia, unspecified: Secondary | ICD-10-CM | POA: Diagnosis present

## 2018-03-15 DIAGNOSIS — R4189 Other symptoms and signs involving cognitive functions and awareness: Secondary | ICD-10-CM | POA: Diagnosis present

## 2018-03-15 DIAGNOSIS — N200 Calculus of kidney: Secondary | ICD-10-CM | POA: Diagnosis not present

## 2018-03-15 DIAGNOSIS — K760 Fatty (change of) liver, not elsewhere classified: Secondary | ICD-10-CM | POA: Diagnosis present

## 2018-03-15 DIAGNOSIS — Z9689 Presence of other specified functional implants: Secondary | ICD-10-CM

## 2018-03-15 DIAGNOSIS — R0602 Shortness of breath: Secondary | ICD-10-CM | POA: Diagnosis not present

## 2018-03-15 DIAGNOSIS — W1830XA Fall on same level, unspecified, initial encounter: Secondary | ICD-10-CM | POA: Diagnosis present

## 2018-03-15 DIAGNOSIS — Z87442 Personal history of urinary calculi: Secondary | ICD-10-CM

## 2018-03-15 DIAGNOSIS — N179 Acute kidney failure, unspecified: Secondary | ICD-10-CM | POA: Diagnosis not present

## 2018-03-15 DIAGNOSIS — F4310 Post-traumatic stress disorder, unspecified: Secondary | ICD-10-CM | POA: Diagnosis present

## 2018-03-15 DIAGNOSIS — M4624 Osteomyelitis of vertebra, thoracic region: Secondary | ICD-10-CM | POA: Diagnosis not present

## 2018-03-15 DIAGNOSIS — R29898 Other symptoms and signs involving the musculoskeletal system: Secondary | ICD-10-CM

## 2018-03-15 DIAGNOSIS — G9341 Metabolic encephalopathy: Secondary | ICD-10-CM | POA: Diagnosis present

## 2018-03-15 DIAGNOSIS — Z419 Encounter for procedure for purposes other than remedying health state, unspecified: Secondary | ICD-10-CM

## 2018-03-15 DIAGNOSIS — Z8679 Personal history of other diseases of the circulatory system: Secondary | ICD-10-CM

## 2018-03-15 DIAGNOSIS — R1111 Vomiting without nausea: Secondary | ICD-10-CM | POA: Diagnosis not present

## 2018-03-15 DIAGNOSIS — R1084 Generalized abdominal pain: Secondary | ICD-10-CM | POA: Diagnosis not present

## 2018-03-15 DIAGNOSIS — I2510 Atherosclerotic heart disease of native coronary artery without angina pectoris: Secondary | ICD-10-CM | POA: Diagnosis present

## 2018-03-15 DIAGNOSIS — E7800 Pure hypercholesterolemia, unspecified: Secondary | ICD-10-CM | POA: Diagnosis present

## 2018-03-15 DIAGNOSIS — Z7951 Long term (current) use of inhaled steroids: Secondary | ICD-10-CM

## 2018-03-15 DIAGNOSIS — Z79891 Long term (current) use of opiate analgesic: Secondary | ICD-10-CM

## 2018-03-15 DIAGNOSIS — E669 Obesity, unspecified: Secondary | ICD-10-CM | POA: Diagnosis present

## 2018-03-15 DIAGNOSIS — R8271 Bacteriuria: Secondary | ICD-10-CM | POA: Diagnosis not present

## 2018-03-15 DIAGNOSIS — A4101 Sepsis due to Methicillin susceptible Staphylococcus aureus: Secondary | ICD-10-CM | POA: Diagnosis not present

## 2018-03-15 DIAGNOSIS — Z7982 Long term (current) use of aspirin: Secondary | ICD-10-CM

## 2018-03-15 DIAGNOSIS — Z713 Dietary counseling and surveillance: Secondary | ICD-10-CM

## 2018-03-15 DIAGNOSIS — R000 Tachycardia, unspecified: Secondary | ICD-10-CM | POA: Diagnosis not present

## 2018-03-15 DIAGNOSIS — M810 Age-related osteoporosis without current pathological fracture: Secondary | ICD-10-CM | POA: Diagnosis present

## 2018-03-15 DIAGNOSIS — Z86010 Personal history of colonic polyps: Secondary | ICD-10-CM

## 2018-03-15 DIAGNOSIS — E538 Deficiency of other specified B group vitamins: Secondary | ICD-10-CM | POA: Diagnosis present

## 2018-03-15 DIAGNOSIS — G9340 Encephalopathy, unspecified: Secondary | ICD-10-CM | POA: Diagnosis not present

## 2018-03-15 DIAGNOSIS — A419 Sepsis, unspecified organism: Secondary | ICD-10-CM | POA: Diagnosis present

## 2018-03-15 DIAGNOSIS — R351 Nocturia: Secondary | ICD-10-CM | POA: Diagnosis not present

## 2018-03-15 DIAGNOSIS — N401 Enlarged prostate with lower urinary tract symptoms: Secondary | ICD-10-CM | POA: Diagnosis not present

## 2018-03-15 DIAGNOSIS — R109 Unspecified abdominal pain: Secondary | ICD-10-CM | POA: Diagnosis not present

## 2018-03-15 DIAGNOSIS — K5900 Constipation, unspecified: Secondary | ICD-10-CM | POA: Diagnosis not present

## 2018-03-15 DIAGNOSIS — F4489 Other dissociative and conversion disorders: Secondary | ICD-10-CM | POA: Diagnosis not present

## 2018-03-15 DIAGNOSIS — G4733 Obstructive sleep apnea (adult) (pediatric): Secondary | ICD-10-CM | POA: Diagnosis present

## 2018-03-15 DIAGNOSIS — R1030 Lower abdominal pain, unspecified: Secondary | ICD-10-CM | POA: Diagnosis not present

## 2018-03-15 DIAGNOSIS — R5381 Other malaise: Secondary | ICD-10-CM | POA: Diagnosis present

## 2018-03-15 DIAGNOSIS — R509 Fever, unspecified: Secondary | ICD-10-CM | POA: Diagnosis present

## 2018-03-15 DIAGNOSIS — T39395A Adverse effect of other nonsteroidal anti-inflammatory drugs [NSAID], initial encounter: Secondary | ICD-10-CM | POA: Diagnosis present

## 2018-03-15 DIAGNOSIS — J309 Allergic rhinitis, unspecified: Secondary | ICD-10-CM | POA: Diagnosis present

## 2018-03-15 DIAGNOSIS — E871 Hypo-osmolality and hyponatremia: Secondary | ICD-10-CM | POA: Diagnosis not present

## 2018-03-15 DIAGNOSIS — I119 Hypertensive heart disease without heart failure: Secondary | ICD-10-CM | POA: Diagnosis present

## 2018-03-15 DIAGNOSIS — K219 Gastro-esophageal reflux disease without esophagitis: Secondary | ICD-10-CM | POA: Diagnosis present

## 2018-03-15 DIAGNOSIS — I10 Essential (primary) hypertension: Secondary | ICD-10-CM | POA: Diagnosis present

## 2018-03-15 DIAGNOSIS — Z801 Family history of malignant neoplasm of trachea, bronchus and lung: Secondary | ICD-10-CM

## 2018-03-15 DIAGNOSIS — Z800 Family history of malignant neoplasm of digestive organs: Secondary | ICD-10-CM

## 2018-03-15 DIAGNOSIS — G8220 Paraplegia, unspecified: Secondary | ICD-10-CM | POA: Diagnosis present

## 2018-03-15 DIAGNOSIS — Z79899 Other long term (current) drug therapy: Secondary | ICD-10-CM

## 2018-03-15 DIAGNOSIS — G062 Extradural and subdural abscess, unspecified: Secondary | ICD-10-CM

## 2018-03-15 DIAGNOSIS — R7881 Bacteremia: Secondary | ICD-10-CM | POA: Diagnosis present

## 2018-03-15 DIAGNOSIS — R911 Solitary pulmonary nodule: Secondary | ICD-10-CM | POA: Diagnosis present

## 2018-03-15 HISTORY — DX: Malignant neoplasm of prostate: C61

## 2018-03-15 LAB — D-DIMER, QUANTITATIVE (NOT AT ARMC): D-Dimer, Quant: 12.01 ug/mL-FEU — ABNORMAL HIGH (ref 0.00–0.50)

## 2018-03-15 LAB — URINALYSIS, ROUTINE W REFLEX MICROSCOPIC
Bacteria, UA: NONE SEEN
Glucose, UA: 50 mg/dL — AB
Ketones, ur: NEGATIVE mg/dL
Leukocytes, UA: NEGATIVE
Nitrite: NEGATIVE
Protein, ur: 100 mg/dL — AB
Specific Gravity, Urine: 1.029 (ref 1.005–1.030)
pH: 5 (ref 5.0–8.0)

## 2018-03-15 LAB — ETHANOL: Alcohol, Ethyl (B): <10 mg/dL (ref <10)

## 2018-03-15 LAB — I-STAT CG4 LACTIC ACID, ED: Lactic Acid, Venous: 1.84 mmol/L (ref 0.5–1.9)

## 2018-03-15 LAB — COMPREHENSIVE METABOLIC PANEL
ALT: 55 U/L (ref 17–63)
AST: 50 U/L — ABNORMAL HIGH (ref 15–41)
Albumin: 2.7 g/dL — ABNORMAL LOW (ref 3.5–5.0)
Alkaline Phosphatase: 118 U/L (ref 38–126)
Anion gap: 15 (ref 5–15)
BUN: 27 mg/dL — ABNORMAL HIGH (ref 6–20)
CO2: 21 mmol/L — ABNORMAL LOW (ref 22–32)
Calcium: 9.2 mg/dL (ref 8.9–10.3)
Chloride: 101 mmol/L (ref 101–111)
Creatinine, Ser: 2.09 mg/dL — ABNORMAL HIGH (ref 0.61–1.24)
GFR calc Af Amer: 35 mL/min — ABNORMAL LOW (ref >60)
GFR calc non Af Amer: 30 mL/min — ABNORMAL LOW (ref >60)
Glucose, Bld: 158 mg/dL — ABNORMAL HIGH (ref 65–99)
Potassium: 3.7 mmol/L (ref 3.5–5.1)
Sodium: 137 mmol/L (ref 135–145)
Total Bilirubin: 1.4 mg/dL — ABNORMAL HIGH (ref 0.3–1.2)
Total Protein: 7.7 g/dL (ref 6.5–8.1)

## 2018-03-15 LAB — CBC WITH DIFFERENTIAL/PLATELET
Basophils Absolute: 0 10*3/uL (ref 0.0–0.1)
Basophils Relative: 0 %
Eosinophils Absolute: 0 10*3/uL (ref 0.0–0.7)
Eosinophils Relative: 0 %
HCT: 35.3 % — ABNORMAL LOW (ref 39.0–52.0)
Hemoglobin: 11.4 g/dL — ABNORMAL LOW (ref 13.0–17.0)
Lymphocytes Relative: 1 %
Lymphs Abs: 0.3 10*3/uL — ABNORMAL LOW (ref 0.7–4.0)
MCH: 27 pg (ref 26.0–34.0)
MCHC: 32.3 g/dL (ref 30.0–36.0)
MCV: 83.6 fL (ref 78.0–100.0)
Monocytes Absolute: 1.2 10*3/uL — ABNORMAL HIGH (ref 0.1–1.0)
Monocytes Relative: 5 %
Neutro Abs: 24.2 10*3/uL — ABNORMAL HIGH (ref 1.7–7.7)
Neutrophils Relative %: 94 %
Platelets: 435 10*3/uL — ABNORMAL HIGH (ref 150–400)
RBC: 4.22 MIL/uL (ref 4.22–5.81)
RDW: 15.8 % — ABNORMAL HIGH (ref 11.5–15.5)
WBC: 25.7 10*3/uL — ABNORMAL HIGH (ref 4.0–10.5)

## 2018-03-15 LAB — TROPONIN I: Troponin I: <0.03 ng/mL (ref <0.03)

## 2018-03-15 MED ORDER — SODIUM CHLORIDE 0.9 % IV BOLUS
1000.0000 mL | Freq: Once | INTRAVENOUS | Status: AC
  Administered 2018-03-15: 1000 mL via INTRAVENOUS

## 2018-03-15 MED ORDER — IOPAMIDOL (ISOVUE-370) INJECTION 76%
100.0000 mL | Freq: Once | INTRAVENOUS | Status: AC | PRN
  Administered 2018-03-15: 80 mL via INTRAVENOUS

## 2018-03-15 MED ORDER — ACETAMINOPHEN 650 MG RE SUPP
975.0000 mg | Freq: Once | RECTAL | Status: AC
  Administered 2018-03-15: 20:00:00 975 mg via RECTAL
  Filled 2018-03-15: qty 1

## 2018-03-15 MED ORDER — PIPERACILLIN-TAZOBACTAM 3.375 G IVPB 30 MIN
3.3750 g | Freq: Once | INTRAVENOUS | Status: AC
  Administered 2018-03-15: 3.375 g via INTRAVENOUS
  Filled 2018-03-15: qty 50

## 2018-03-15 NOTE — ED Notes (Signed)
Pt and family updated on plan of care,  

## 2018-03-15 NOTE — ED Triage Notes (Addendum)
Pt brought in by [facility] with c/o weakness and syncopal episode today. Pt's wife was bringing pt to the hospital and said he fell face first into the car during syncopal episode. Pt's O2 sat 89-90% on RA with increased respirations. Pt alert to self, place, time but not to situation. Pt reports back pain and has hx of this. Pt appears weak. Recently admitted to a hospital in [facility] for chest pain with negative workup.

## 2018-03-15 NOTE — ED Notes (Signed)
Received report from previous RN, pt will converse with family and RN but is confused, pt and family updated on plan of care, wife states that pt was started on doxycycline today for some type of infection in urine while at the urologist.

## 2018-03-15 NOTE — ED Notes (Signed)
Patient transported to X-ray 

## 2018-03-15 NOTE — ED Notes (Signed)
Patient transported to CT 

## 2018-03-15 NOTE — ED Notes (Signed)
Pt fever 102.4, Dr Alvino Chapel notified, additional orders received,

## 2018-03-15 NOTE — ED Notes (Signed)
Lab at bedside for second set of blood cultures, one set had already been drawn,

## 2018-03-15 NOTE — ED Provider Notes (Signed)
[facility] EMERGENCY DEPARTMENT Provider Note   CSN: [id number] Arrival date & time: 03/15/18  1812     History   Chief Complaint Chief Complaint  Patient presents with  . Weakness    HPI Cody Velez is a 72 y.o. male. Level 5 caveat due to altered mental status. HPI Patient presents with family.  Has generalized weakness and feeling bad.  Had been seen at urology today.  Recently was in the hospital at [facility] after reported fall.  Reportedly has CAT scan of his head and some x-rays.  Went home and was told it could be kidney stones.  Followed up with urology today.  Reportedly had a low pulse ox there.  Went home and then had a fall.  States his legs gave out and he fell forward.  Has had confusion since then.  no headache.  Not on anticoagulation.  Has recent spinal stimulator placed.  Reportedly had a head CT done while he was at [facility].  Hypoxic upon arrival. Past Medical History:  Diagnosis Date  . Allergic rhinitis   . Arthritis   . Asthma    as a child  . B12 deficiency   . Cancer [facility])    prostate  . Cervicogenic headache 01/28/2016  . Chronic back pain   . Coronary atherosclerosis of native coronary artery    Mild nonobstructive CAD at catheterization January 2015  . Depression   . Essential hypertension, benign   . Falls   . GERD (gastroesophageal reflux disease)   . History of cerebrovascular disease 07/23/2015  . History of pneumonia 02/2011  . Hyperlipidemia   . Kidney stone   . Memory difficulty 07/23/2015  . OSA (obstructive sleep apnea)    CPAP - Dr. [name]  . Prostate cancer ([place])   . PTSD (post-traumatic stress disorder)    [place]  . Rectal bleeding     Patient Active Problem List   Diagnosis Date Noted  . Cervicogenic headache 01/28/2016  . B12 deficiency 01/28/2016  . Chest pain 10/08/2015  . Memory difficulty 07/23/2015  . History of cerebrovascular disease 07/23/2015  . Unspecified constipation 08/13/2014  . Fecal soiling 08/13/2014  .  FH: colon cancer 08/13/2014  . Hx of adenomatous colonic polyps 08/13/2014  . Cough with hemoptysis 01/01/2014  . Chronic back pain 05/30/2012  . CAD, NATIVE VESSEL 07/17/2010  . HYPERCHOLESTEROLEMIA 10/31/2009  . SMOKELESS TOBACCO ABUSE 10/31/2009  . Obstructive sleep apnea 10/31/2009  . Essential hypertension, benign 10/31/2009  . GERD 10/31/2009    Past Surgical History:  Procedure Laterality Date  . BACK SURGERY  02/14/12   lumbar OR #7; "today redid L1L2; replaced screws; added bone from hip"  . BILATERAL KNEE ARTHROSCOPY    . COLONOSCOPY  10/15/2008   Dr. [name]: tubular adenoma   . COLONOSCOPY  12/17/2003   ZMO:[id number] rectal and colon  . COLONOSCOPY N/A 09/05/2014   Procedure: COLONOSCOPY;  Surgeon: Daneil Dolin, MD;  Location: [facility] ENDO SUITE;  Service: Endoscopy;  Laterality: N/A;  7:30-rescheduled 9/17 to [place] notified pt  . CYSTOSCOPY WITH STENT PLACEMENT Right 01/27/2016   Procedure: CYSTOSCOPY WITH STENT PLACEMENT;  Surgeon: [name], MD;  Location: [facility] ORS;  Service: Urology;  Laterality: Right;  . CYSTOSCOPY/RETROGRADE/URETEROSCOPY/STONE EXTRACTION WITH BASKET Right 01/27/2016   Procedure: CYSTOSCOPY, RIGHT RETROGRADE, RIGHT URETEROSCOPY, STONE EXTRACTION ;  Surgeon: [name], MD;  Location: [facility] ORS;  Service: Urology;  Laterality: Right;  . ESOPHAGOGASTRODUODENOSCOPY  10/15/2008     Dr  Rourk:Schatzki's ring status post dilation and disruption via 14 F Maloney dilator/ otherwise unremarkable esophagus, small hiatal hernia, multiple fundal gland polyps not manipulated, gastritis, negative H.pylori  . ESOPHAGOGASTRODUODENOSCOPY  06/21/02   LKG:MWNUU sliding hiatal hernia with mild changes of reflux esophagitis limited to gastroesophageal junction.  Noncritical ring at distal esophagus, 3 cm proximal to gastroesophageal junction/Antral gastritis  . [place]   "broke face playing softball"  . FRACTURE SURGERY     "left wrist;  broke it; took spur off"  . HOLMIUM LASER APPLICATION Right 06/20/5365   Procedure: HOLMIUM LASER APPLICATION;  Surgeon: [name], MD;  Location: [facility] ORS;  Service: Urology;  Laterality: Right;  . LEFT HEART CATHETERIZATION WITH CORONARY ANGIOGRAM N/A 12/27/2013   Procedure: LEFT HEART CATHETERIZATION WITH CORONARY ANGIOGRAM;  Surgeon: [name], MD;  Location: [facility] CATH LAB;  Service: Cardiovascular;  Laterality: N/A;  . neck epidural    . SHOULDER SURGERY Bilateral         Home Medications    Prior to Admission medications   Medication Sig Start Date End Date Taking? Authorizing Provider  amLODipine (NORVASC) 10 MG tablet Take 10 mg by mouth daily.    [provider]  aspirin 81 MG tablet Take 81 mg by mouth daily.    [provider]  baclofen (LIORESAL) 10 MG tablet Take 10 mg by mouth 3 (three) times daily.    [provider]  docusate sodium (COLACE) 100 MG capsule Take 200 mg by mouth 2 (two) times daily.    [provider]  donepezil (ARICEPT) 10 MG tablet Take 1 tablet (10 mg total) by mouth at bedtime. 06/24/16   Kathrynn Ducking, MD  finasteride (PROSCAR) 5 MG tablet Take 5 mg by mouth daily. Reported on 05/04/2016    [provider]  fluticasone (FLONASE) 50 MCG/ACT nasal spray Place 1 spray into both nostrils as needed.     [provider]  gabapentin (NEURONTIN) 300 MG capsule Take 3 capsules (900 mg total) by mouth 3 (three) times daily. 11/17/17   Kathrynn Ducking, MD  ibuprofen (ADVIL,MOTRIN) 600 MG tablet Take 600 mg by mouth 3 (three) times daily.    [provider]  losartan (COZAAR) 100 MG tablet Take 100 mg by mouth every morning.     [provider]  memantine (NAMENDA) 10 MG tablet Take 1 tablet (10 mg total) by mouth 2 (two) times daily. 12/21/17   Kathrynn Ducking, MD  omeprazole (PRILOSEC) 20 MG capsule Take 40 mg by mouth every morning.     [provider]    oxyCODONE-acetaminophen (PERCOCET/ROXICET) 5-325 MG tablet Take 2 tablets by mouth every 6 (six) hours as needed for severe pain.    [provider]  pravastatin (PRAVACHOL) 10 MG tablet Take 10 mg by mouth every evening.     [provider]  sildenafil (VIAGRA) 100 MG tablet Take 100 mg by mouth daily as needed for erectile dysfunction.    [provider]  traZODone (DESYREL) 100 MG tablet Take 100 mg by mouth at bedtime.    [provider]  venlafaxine XR (EFFEXOR-XR) 150 MG 24 hr capsule Take 150 mg by mouth 2 (two) times daily.     [provider]  vitamin B-12 (CYANOCOBALAMIN) 1000 MCG tablet Take 1,000 mcg by mouth daily.    [provider]    Family History Family History  Problem Relation Age of Onset  . Emphysema Father   .  Heart failure Father   . Lung cancer Father   . CAD Father   . Colon cancer Mother   . Stroke Mother   . Breast cancer Mother   . Stroke Sister   . Heart attack Brother   . Dementia Paternal Uncle   . Emphysema Maternal Grandmother   . Stroke Maternal Grandmother   . Asthma Other        grandson  . Heart disease Paternal Grandfather   . Anesthesia problems Neg Hx   . Hypotension Neg Hx   . Malignant hyperthermia Neg Hx   . Pseudochol deficiency Neg Hx     Social History Social History   Tobacco Use  . Smoking status: Former Smoker    Types: Cigarettes    Last attempt to quit: 12/20/1958    Years since quitting: 59.2  . Smokeless tobacco: Current User    Types: Chew  . Tobacco comment: Quit chewing 04/2015  Substance Use Topics  . Alcohol use: Yes    Alcohol/week: 0.0 oz    Comment: couple bottles of wine per month  . Drug use: No    Types: Marijuana    Comment: "last used marijuana ~ 1969"     Allergies   Patient has no known allergies.   Review of Systems Review of Systems  Unable to perform ROS: Mental status change     Physical Exam Updated Vital Signs BP 139/84    Pulse (!) 115   Temp 98.6 F (37 C) (Oral)   Resp 18   Ht 6\' 3"  (1.905 m)   Wt 136.1 kg (300 lb)   SpO2 95%   BMI 37.50 kg/m   Physical Exam  Constitutional: He appears well-developed.  HENT:  Head: Atraumatic.  Eyes: Pupils are equal, round, and reactive to light.  Neck: Neck supple.  Cardiovascular:  Tachycardia  Pulmonary/Chest:  Mildly harsh breath sounds  Abdominal: He exhibits distension.  Mild distention without tenderness.  Musculoskeletal: He exhibits no tenderness.  Neurological: He is alert.  Patient is somewhat slow to answer but is alert.  Identifies his daughter and his wife but is somewhat slow to answer with it.  There was some pauses when he did not answer questions and some questions he did answer with the delay.  Skin: Skin is warm.     ED Treatments / Results  Labs (all labs ordered are listed, but only abnormal results are displayed) Labs Reviewed  COMPREHENSIVE METABOLIC PANEL  ETHANOL  CBC WITH DIFFERENTIAL/PLATELET  TROPONIN I  D-DIMER, QUANTITATIVE (NOT AT [facility])  URINALYSIS, ROUTINE W REFLEX MICROSCOPIC    EKG EKG Interpretation  Date/Time:  Wednesday March 15 2018 18:14:28 EDT Ventricular Rate:  113 PR Interval:    QRS Duration: 98 QT Interval:  335 QTC Calculation: 460 R Axis:   -58 Text Interpretation:  Sinus tachycardia Atrial premature complex Left anterior fascicular block Abnormal R-wave progression, late transition Confirmed by Davonna Belling [phone]) on 03/15/2018 6:21:14 PM   Radiology Dg Chest Portable 1 View  Result Date: 03/15/2018 CLINICAL DATA:  72 year old male status post fall getting into car today. Confusion beginning about 1630 hours. Shortness of breath. EXAM: PORTABLE CHEST 1 VIEW COMPARISON:  Portable chest 05/04/2016 and earlier. FINDINGS: Portable AP upright view at 1854 hours. Chronic cardiomegaly. Stable mediastinal contours. Visualized tracheal air column is within normal limits. Mild respiratory motion  artifact. Suggestion of increased pulmonary vascularity compared to 2017. No pneumothorax, pleural effusion or consolidation is evident. Prior cervical spine surgery with  spinal stimulator device coursing to the cervical region. No acute osseous abnormality identified. Negative visible bowel gas pattern. IMPRESSION: Cardiomegaly with appearance of increased pulmonary vascularity from prior studies. Consider acute pulmonary edema. Electronically Signed   By: Genevie Ann M.D.   On: 03/15/2018 19:09    Procedures Procedures (including critical care time)  Medications Ordered in ED Medications - No data to display   Initial Impression / Assessment and Plan / ED Course  I have reviewed the triage vital signs and the nursing notes.  Pertinent labs & imaging results that were available during my care of the patient were reviewed by me and considered in my medical decision making (see chart for details).     Patient presents with generalized weakness and some confusion.  After further discussion has had episodes somewhat over the last week.  Was seen in outside hospital reportedly had a head CT at that time.  Today just fell forward and did not strike his head.  Fever of 102 here.  Had seen urology today and reportedly started on doxycycline for urinary tract infection.  Urine here however does not clearly show infection.  White count is elevated.  Normal lactic acid.  Creatinine is increased.  Mental status improved somewhat after IV fluids.  Chest x-ray does not show pneumonia.  CT scan done of chest and abdomen pelvis without clear cause of fever.  Does have spinal stimulator that was somewhat recently placed.  Site on the back does not look infected.  With fever will admit to hospitalist.  Final Clinical Impressions(s) / ED Diagnoses   Final diagnoses:  None    ED Discharge Orders    None       Davonna Belling, MD 03/15/18 2330

## 2018-03-15 NOTE — ED Triage Notes (Signed)
According to wife, pt became weak getting into the car today and fell down into the car. Wife denies LOC. Reports of confusion that started about 1630 when the fall occurred. EDP made aware.

## 2018-03-16 DIAGNOSIS — M4644 Discitis, unspecified, thoracic region: Secondary | ICD-10-CM | POA: Diagnosis not present

## 2018-03-16 DIAGNOSIS — D62 Acute posthemorrhagic anemia: Secondary | ICD-10-CM | POA: Diagnosis not present

## 2018-03-16 DIAGNOSIS — N200 Calculus of kidney: Secondary | ICD-10-CM

## 2018-03-16 DIAGNOSIS — A4101 Sepsis due to Methicillin susceptible Staphylococcus aureus: Secondary | ICD-10-CM | POA: Diagnosis not present

## 2018-03-16 DIAGNOSIS — S3992XA Unspecified injury of lower back, initial encounter: Secondary | ICD-10-CM | POA: Diagnosis not present

## 2018-03-16 DIAGNOSIS — Z8546 Personal history of malignant neoplasm of prostate: Secondary | ICD-10-CM | POA: Diagnosis not present

## 2018-03-16 DIAGNOSIS — Z4789 Encounter for other orthopedic aftercare: Secondary | ICD-10-CM | POA: Diagnosis present

## 2018-03-16 DIAGNOSIS — I824Z3 Acute embolism and thrombosis of unspecified deep veins of distal lower extremity, bilateral: Secondary | ICD-10-CM | POA: Diagnosis not present

## 2018-03-16 DIAGNOSIS — M549 Dorsalgia, unspecified: Secondary | ICD-10-CM | POA: Diagnosis not present

## 2018-03-16 DIAGNOSIS — G959 Disease of spinal cord, unspecified: Secondary | ICD-10-CM | POA: Diagnosis not present

## 2018-03-16 DIAGNOSIS — D638 Anemia in other chronic diseases classified elsewhere: Secondary | ICD-10-CM | POA: Diagnosis not present

## 2018-03-16 DIAGNOSIS — R531 Weakness: Secondary | ICD-10-CM | POA: Diagnosis not present

## 2018-03-16 DIAGNOSIS — J9601 Acute respiratory failure with hypoxia: Secondary | ICD-10-CM | POA: Diagnosis not present

## 2018-03-16 DIAGNOSIS — F329 Major depressive disorder, single episode, unspecified: Secondary | ICD-10-CM | POA: Diagnosis not present

## 2018-03-16 DIAGNOSIS — G061 Intraspinal abscess and granuloma: Secondary | ICD-10-CM | POA: Diagnosis not present

## 2018-03-16 DIAGNOSIS — A419 Sepsis, unspecified organism: Secondary | ICD-10-CM | POA: Diagnosis not present

## 2018-03-16 DIAGNOSIS — M4624 Osteomyelitis of vertebra, thoracic region: Secondary | ICD-10-CM | POA: Diagnosis not present

## 2018-03-16 DIAGNOSIS — R140 Abdominal distension (gaseous): Secondary | ICD-10-CM | POA: Diagnosis not present

## 2018-03-16 DIAGNOSIS — Z8673 Personal history of transient ischemic attack (TIA), and cerebral infarction without residual deficits: Secondary | ICD-10-CM | POA: Diagnosis not present

## 2018-03-16 DIAGNOSIS — M546 Pain in thoracic spine: Secondary | ICD-10-CM | POA: Diagnosis not present

## 2018-03-16 DIAGNOSIS — I10 Essential (primary) hypertension: Secondary | ICD-10-CM

## 2018-03-16 DIAGNOSIS — G8929 Other chronic pain: Secondary | ICD-10-CM | POA: Diagnosis not present

## 2018-03-16 DIAGNOSIS — R29898 Other symptoms and signs involving the musculoskeletal system: Secondary | ICD-10-CM | POA: Diagnosis not present

## 2018-03-16 DIAGNOSIS — N179 Acute kidney failure, unspecified: Secondary | ICD-10-CM | POA: Diagnosis not present

## 2018-03-16 DIAGNOSIS — R4189 Other symptoms and signs involving cognitive functions and awareness: Secondary | ICD-10-CM

## 2018-03-16 DIAGNOSIS — K5900 Constipation, unspecified: Secondary | ICD-10-CM | POA: Diagnosis not present

## 2018-03-16 DIAGNOSIS — G479 Sleep disorder, unspecified: Secondary | ICD-10-CM | POA: Diagnosis not present

## 2018-03-16 DIAGNOSIS — I119 Hypertensive heart disease without heart failure: Secondary | ICD-10-CM | POA: Diagnosis present

## 2018-03-16 DIAGNOSIS — M961 Postlaminectomy syndrome, not elsewhere classified: Secondary | ICD-10-CM | POA: Diagnosis not present

## 2018-03-16 DIAGNOSIS — E871 Hypo-osmolality and hyponatremia: Secondary | ICD-10-CM | POA: Diagnosis not present

## 2018-03-16 DIAGNOSIS — G9340 Encephalopathy, unspecified: Secondary | ICD-10-CM | POA: Diagnosis not present

## 2018-03-16 DIAGNOSIS — Z79899 Other long term (current) drug therapy: Secondary | ICD-10-CM | POA: Diagnosis not present

## 2018-03-16 DIAGNOSIS — G253 Myoclonus: Secondary | ICD-10-CM | POA: Diagnosis not present

## 2018-03-16 DIAGNOSIS — K921 Melena: Secondary | ICD-10-CM | POA: Diagnosis not present

## 2018-03-16 DIAGNOSIS — W1830XA Fall on same level, unspecified, initial encounter: Secondary | ICD-10-CM | POA: Diagnosis present

## 2018-03-16 DIAGNOSIS — Z9889 Other specified postprocedural states: Secondary | ICD-10-CM | POA: Diagnosis not present

## 2018-03-16 DIAGNOSIS — G9341 Metabolic encephalopathy: Secondary | ICD-10-CM | POA: Diagnosis present

## 2018-03-16 DIAGNOSIS — K5901 Slow transit constipation: Secondary | ICD-10-CM | POA: Diagnosis not present

## 2018-03-16 DIAGNOSIS — F4310 Post-traumatic stress disorder, unspecified: Secondary | ICD-10-CM | POA: Diagnosis present

## 2018-03-16 DIAGNOSIS — E538 Deficiency of other specified B group vitamins: Secondary | ICD-10-CM | POA: Diagnosis not present

## 2018-03-16 DIAGNOSIS — Z9689 Presence of other specified functional implants: Secondary | ICD-10-CM | POA: Diagnosis not present

## 2018-03-16 DIAGNOSIS — Z800 Family history of malignant neoplasm of digestive organs: Secondary | ICD-10-CM | POA: Diagnosis not present

## 2018-03-16 DIAGNOSIS — R4020 Unspecified coma: Secondary | ICD-10-CM | POA: Diagnosis not present

## 2018-03-16 DIAGNOSIS — E785 Hyperlipidemia, unspecified: Secondary | ICD-10-CM | POA: Diagnosis present

## 2018-03-16 DIAGNOSIS — E7800 Pure hypercholesterolemia, unspecified: Secondary | ICD-10-CM | POA: Diagnosis not present

## 2018-03-16 DIAGNOSIS — G4733 Obstructive sleep apnea (adult) (pediatric): Secondary | ICD-10-CM | POA: Diagnosis not present

## 2018-03-16 DIAGNOSIS — I2510 Atherosclerotic heart disease of native coronary artery without angina pectoris: Secondary | ICD-10-CM | POA: Diagnosis not present

## 2018-03-16 DIAGNOSIS — M4325 Fusion of spine, thoracolumbar region: Secondary | ICD-10-CM | POA: Diagnosis not present

## 2018-03-16 DIAGNOSIS — R509 Fever, unspecified: Secondary | ICD-10-CM | POA: Diagnosis present

## 2018-03-16 DIAGNOSIS — E876 Hypokalemia: Secondary | ICD-10-CM | POA: Diagnosis not present

## 2018-03-16 DIAGNOSIS — J309 Allergic rhinitis, unspecified: Secondary | ICD-10-CM | POA: Diagnosis not present

## 2018-03-16 DIAGNOSIS — S299XXA Unspecified injury of thorax, initial encounter: Secondary | ICD-10-CM | POA: Diagnosis not present

## 2018-03-16 DIAGNOSIS — M4802 Spinal stenosis, cervical region: Secondary | ICD-10-CM | POA: Diagnosis not present

## 2018-03-16 DIAGNOSIS — R911 Solitary pulmonary nodule: Secondary | ICD-10-CM | POA: Diagnosis present

## 2018-03-16 DIAGNOSIS — G8220 Paraplegia, unspecified: Secondary | ICD-10-CM | POA: Diagnosis not present

## 2018-03-16 DIAGNOSIS — G8918 Other acute postprocedural pain: Secondary | ICD-10-CM | POA: Diagnosis not present

## 2018-03-16 DIAGNOSIS — G894 Chronic pain syndrome: Secondary | ICD-10-CM | POA: Diagnosis not present

## 2018-03-16 DIAGNOSIS — B9561 Methicillin susceptible Staphylococcus aureus infection as the cause of diseases classified elsewhere: Secondary | ICD-10-CM | POA: Diagnosis not present

## 2018-03-16 DIAGNOSIS — Z87891 Personal history of nicotine dependence: Secondary | ICD-10-CM | POA: Diagnosis not present

## 2018-03-16 DIAGNOSIS — F0390 Unspecified dementia without behavioral disturbance: Secondary | ICD-10-CM | POA: Diagnosis not present

## 2018-03-16 DIAGNOSIS — Z981 Arthrodesis status: Secondary | ICD-10-CM | POA: Diagnosis not present

## 2018-03-16 DIAGNOSIS — M545 Low back pain: Secondary | ICD-10-CM | POA: Diagnosis not present

## 2018-03-16 DIAGNOSIS — Z95828 Presence of other vascular implants and grafts: Secondary | ICD-10-CM | POA: Diagnosis not present

## 2018-03-16 DIAGNOSIS — J181 Lobar pneumonia, unspecified organism: Secondary | ICD-10-CM | POA: Diagnosis not present

## 2018-03-16 DIAGNOSIS — Z801 Family history of malignant neoplasm of trachea, bronchus and lung: Secondary | ICD-10-CM | POA: Diagnosis not present

## 2018-03-16 DIAGNOSIS — I82491 Acute embolism and thrombosis of other specified deep vein of right lower extremity: Secondary | ICD-10-CM | POA: Diagnosis present

## 2018-03-16 DIAGNOSIS — R7881 Bacteremia: Secondary | ICD-10-CM | POA: Diagnosis not present

## 2018-03-16 DIAGNOSIS — S0990XA Unspecified injury of head, initial encounter: Secondary | ICD-10-CM | POA: Diagnosis not present

## 2018-03-16 DIAGNOSIS — M7989 Other specified soft tissue disorders: Secondary | ICD-10-CM | POA: Diagnosis not present

## 2018-03-16 DIAGNOSIS — Z8679 Personal history of other diseases of the circulatory system: Secondary | ICD-10-CM | POA: Diagnosis not present

## 2018-03-16 DIAGNOSIS — D649 Anemia, unspecified: Secondary | ICD-10-CM | POA: Diagnosis present

## 2018-03-16 DIAGNOSIS — K219 Gastro-esophageal reflux disease without esophagitis: Secondary | ICD-10-CM | POA: Diagnosis not present

## 2018-03-16 DIAGNOSIS — E669 Obesity, unspecified: Secondary | ICD-10-CM | POA: Diagnosis not present

## 2018-03-16 DIAGNOSIS — I82493 Acute embolism and thrombosis of other specified deep vein of lower extremity, bilateral: Secondary | ICD-10-CM | POA: Diagnosis not present

## 2018-03-16 LAB — RESPIRATORY PANEL BY PCR

## 2018-03-16 LAB — BASIC METABOLIC PANEL
Anion gap: 13 (ref 5–15)
BUN: 25 mg/dL — ABNORMAL HIGH (ref 6–20)
CO2: 23 mmol/L (ref 22–32)
Calcium: 8.6 mg/dL — ABNORMAL LOW (ref 8.9–10.3)
Chloride: 101 mmol/L (ref 101–111)
Creatinine, Ser: 1.53 mg/dL — ABNORMAL HIGH (ref 0.61–1.24)
GFR calc Af Amer: 51 mL/min — ABNORMAL LOW (ref >60)
GFR calc non Af Amer: 44 mL/min — ABNORMAL LOW (ref >60)
Glucose, Bld: 131 mg/dL — ABNORMAL HIGH (ref 65–99)
Potassium: 3.3 mmol/L — ABNORMAL LOW (ref 3.5–5.1)
Sodium: 137 mmol/L (ref 135–145)

## 2018-03-16 LAB — CK: Total CK: 438 U/L — ABNORMAL HIGH (ref 49–397)

## 2018-03-16 LAB — CBC
HCT: 33.7 % — ABNORMAL LOW (ref 39.0–52.0)
Hemoglobin: 10.7 g/dL — ABNORMAL LOW (ref 13.0–17.0)
MCH: 26.5 pg (ref 26.0–34.0)
MCHC: 31.8 g/dL (ref 30.0–36.0)
MCV: 83.4 fL (ref 78.0–100.0)
Platelets: 428 10*3/uL — ABNORMAL HIGH (ref 150–400)
RBC: 4.04 MIL/uL — ABNORMAL LOW (ref 4.22–5.81)
RDW: 15.6 % — ABNORMAL HIGH (ref 11.5–15.5)
WBC: 22.9 10*3/uL — ABNORMAL HIGH (ref 4.0–10.5)

## 2018-03-16 LAB — MAGNESIUM: Magnesium: 2 mg/dL (ref 1.7–2.4)

## 2018-03-16 LAB — LACTIC ACID, PLASMA: Lactic Acid, Venous: 1.1 mmol/L (ref 0.5–1.9)

## 2018-03-16 LAB — PROCALCITONIN: Procalcitonin: 2.15 ng/mL

## 2018-03-16 MED ORDER — DOCUSATE SODIUM 100 MG PO CAPS
200.0000 mg | ORAL_CAPSULE | Freq: Two times a day (BID) | ORAL | Status: DC
  Administered 2018-03-16 – 2018-03-18 (×6): 200 mg via ORAL
  Filled 2018-03-16 (×7): qty 2

## 2018-03-16 MED ORDER — AMLODIPINE BESYLATE 10 MG PO TABS
10.0000 mg | ORAL_TABLET | Freq: Every day | ORAL | Status: DC
  Administered 2018-03-16 – 2018-03-30 (×14): 10 mg via ORAL
  Filled 2018-03-16 (×4): qty 1
  Filled 2018-03-16: qty 2
  Filled 2018-03-16 (×2): qty 1
  Filled 2018-03-16 (×2): qty 2
  Filled 2018-03-16: qty 1
  Filled 2018-03-16 (×2): qty 2
  Filled 2018-03-16: qty 1
  Filled 2018-03-16: qty 2

## 2018-03-16 MED ORDER — GABAPENTIN 300 MG PO CAPS
900.0000 mg | ORAL_CAPSULE | Freq: Three times a day (TID) | ORAL | Status: DC
  Administered 2018-03-16 (×2): 900 mg via ORAL
  Filled 2018-03-16 (×2): qty 3

## 2018-03-16 MED ORDER — LATANOPROST 0.005 % OP SOLN
1.0000 [drp] | Freq: Every day | OPHTHALMIC | Status: DC
  Administered 2018-03-16 – 2018-03-29 (×9): 1 [drp] via OPHTHALMIC
  Filled 2018-03-16 (×3): qty 2.5

## 2018-03-16 MED ORDER — MEMANTINE HCL 10 MG PO TABS
10.0000 mg | ORAL_TABLET | Freq: Two times a day (BID) | ORAL | Status: DC
  Administered 2018-03-16 – 2018-03-30 (×28): 10 mg via ORAL
  Filled 2018-03-16 (×28): qty 1

## 2018-03-16 MED ORDER — POTASSIUM CHLORIDE IN NACL 20-0.9 MEQ/L-% IV SOLN
INTRAVENOUS | Status: DC
  Administered 2018-03-16 (×2): via INTRAVENOUS

## 2018-03-16 MED ORDER — LACTATED RINGERS IV SOLN
INTRAVENOUS | Status: DC
  Administered 2018-03-16: 03:00:00 via INTRAVENOUS

## 2018-03-16 MED ORDER — PANTOPRAZOLE SODIUM 40 MG PO TBEC
40.0000 mg | DELAYED_RELEASE_TABLET | Freq: Every day | ORAL | Status: DC
  Administered 2018-03-16 – 2018-03-30 (×14): 40 mg via ORAL
  Filled 2018-03-16 (×14): qty 1

## 2018-03-16 MED ORDER — OXYCODONE-ACETAMINOPHEN 5-325 MG PO TABS
2.0000 | ORAL_TABLET | Freq: Four times a day (QID) | ORAL | Status: DC | PRN
  Administered 2018-03-16 – 2018-03-30 (×24): 2 via ORAL
  Filled 2018-03-16 (×24): qty 2

## 2018-03-16 MED ORDER — VANCOMYCIN HCL 10 G IV SOLR
1500.0000 mg | INTRAVENOUS | Status: DC
  Administered 2018-03-16 – 2018-03-17 (×2): 1500 mg via INTRAVENOUS
  Filled 2018-03-16 (×4): qty 1500

## 2018-03-16 MED ORDER — DONEPEZIL HCL 10 MG PO TABS
10.0000 mg | ORAL_TABLET | Freq: Every day | ORAL | Status: DC
  Administered 2018-03-16 – 2018-03-29 (×14): 10 mg via ORAL
  Filled 2018-03-16 (×5): qty 1
  Filled 2018-03-16 (×2): qty 2
  Filled 2018-03-16 (×2): qty 1
  Filled 2018-03-16 (×2): qty 2
  Filled 2018-03-16 (×4): qty 1
  Filled 2018-03-16: qty 2

## 2018-03-16 MED ORDER — ACETAMINOPHEN 325 MG PO TABS
650.0000 mg | ORAL_TABLET | Freq: Four times a day (QID) | ORAL | Status: DC | PRN
  Administered 2018-03-16 – 2018-03-17 (×2): 650 mg via ORAL
  Filled 2018-03-16 (×3): qty 2

## 2018-03-16 MED ORDER — VENLAFAXINE HCL ER 150 MG PO CP24
150.0000 mg | ORAL_CAPSULE | Freq: Two times a day (BID) | ORAL | Status: DC
  Administered 2018-03-16 – 2018-03-30 (×27): 150 mg via ORAL
  Filled 2018-03-16 (×4): qty 2
  Filled 2018-03-16: qty 1
  Filled 2018-03-16 (×5): qty 2
  Filled 2018-03-16: qty 1
  Filled 2018-03-16 (×5): qty 2
  Filled 2018-03-16: qty 1
  Filled 2018-03-16 (×9): qty 2
  Filled 2018-03-16: qty 1

## 2018-03-16 MED ORDER — TRAZODONE HCL 100 MG PO TABS
100.0000 mg | ORAL_TABLET | Freq: Every day | ORAL | Status: DC
  Administered 2018-03-16 – 2018-03-29 (×12): 100 mg via ORAL
  Filled 2018-03-16 (×2): qty 1
  Filled 2018-03-16: qty 2
  Filled 2018-03-16 (×3): qty 1
  Filled 2018-03-16 (×3): qty 2
  Filled 2018-03-16 (×4): qty 1
  Filled 2018-03-16: qty 2

## 2018-03-16 MED ORDER — GABAPENTIN 300 MG PO CAPS
600.0000 mg | ORAL_CAPSULE | Freq: Three times a day (TID) | ORAL | Status: DC
  Administered 2018-03-16 – 2018-03-30 (×39): 600 mg via ORAL
  Filled 2018-03-16 (×20): qty 2
  Filled 2018-03-16: qty 6
  Filled 2018-03-16 (×19): qty 2

## 2018-03-16 MED ORDER — ACETAMINOPHEN 650 MG RE SUPP: 650.0000 mg | Freq: Four times a day (QID) | RECTAL | Status: DC | PRN

## 2018-03-16 MED ORDER — FINASTERIDE 5 MG PO TABS
5.0000 mg | ORAL_TABLET | Freq: Every day | ORAL | Status: DC
  Administered 2018-03-16 – 2018-03-30 (×14): 5 mg via ORAL
  Filled 2018-03-16 (×16): qty 1

## 2018-03-16 MED ORDER — PIPERACILLIN-TAZOBACTAM 3.375 G IVPB
3.3750 g | Freq: Three times a day (TID) | INTRAVENOUS | Status: DC
  Administered 2018-03-16 – 2018-03-18 (×7): 3.375 g via INTRAVENOUS
  Filled 2018-03-16 (×19): qty 50

## 2018-03-16 MED ORDER — PRAVASTATIN SODIUM 20 MG PO TABS
10.0000 mg | ORAL_TABLET | Freq: Every evening | ORAL | Status: DC
  Administered 2018-03-16 – 2018-03-29 (×14): 10 mg via ORAL
  Filled 2018-03-16 (×16): qty 1

## 2018-03-16 MED ORDER — HYDRALAZINE HCL 20 MG/ML IJ SOLN: 10.0000 mg | INTRAMUSCULAR | Status: DC | PRN

## 2018-03-16 MED ORDER — ENOXAPARIN SODIUM 40 MG/0.4ML ~~LOC~~ SOLN
40.0000 mg | SUBCUTANEOUS | Status: DC
  Administered 2018-03-16 – 2018-03-26 (×11): 40 mg via SUBCUTANEOUS
  Filled 2018-03-16 (×11): qty 0.4

## 2018-03-16 MED ORDER — CYCLOSPORINE 0.05 % OP EMUL
1.0000 [drp] | Freq: Two times a day (BID) | OPHTHALMIC | Status: DC
  Administered 2018-03-16 – 2018-03-30 (×26): 1 [drp] via OPHTHALMIC
  Filled 2018-03-16 (×35): qty 1

## 2018-03-16 MED ORDER — VANCOMYCIN HCL 10 G IV SOLR
1500.0000 mg | INTRAVENOUS | Status: DC
  Filled 2018-03-16: qty 1500

## 2018-03-16 MED ORDER — ONDANSETRON HCL 4 MG PO TABS: 4.0000 mg | ORAL_TABLET | Freq: Four times a day (QID) | ORAL | Status: DC | PRN

## 2018-03-16 MED ORDER — VITAMIN B-12 1000 MCG PO TABS
1000.0000 ug | ORAL_TABLET | Freq: Every day | ORAL | Status: DC
Start: 2018-03-16 — End: 2018-03-30
  Administered 2018-03-16 – 2018-03-30 (×14): 1000 ug via ORAL
  Filled 2018-03-16 (×14): qty 1

## 2018-03-16 MED ORDER — ONDANSETRON HCL 4 MG/2ML IJ SOLN: 4.0000 mg | Freq: Four times a day (QID) | INTRAMUSCULAR | Status: DC | PRN

## 2018-03-16 MED ORDER — FLUTICASONE PROPIONATE 50 MCG/ACT NA SUSP: 1.0000 | Freq: Every day | NASAL | Status: DC | PRN

## 2018-03-16 MED ORDER — VANCOMYCIN HCL IN DEXTROSE 1-5 GM/200ML-% IV SOLN
1000.0000 mg | INTRAVENOUS | Status: AC
  Administered 2018-03-16: 1000 mg via INTRAVENOUS
  Filled 2018-03-16: qty 200

## 2018-03-16 MED ORDER — ASPIRIN EC 81 MG PO TBEC
81.0000 mg | DELAYED_RELEASE_TABLET | Freq: Every day | ORAL | Status: DC
  Administered 2018-03-16 – 2018-03-30 (×14): 81 mg via ORAL
  Filled 2018-03-16 (×14): qty 1

## 2018-03-16 NOTE — Clinical Social Work Note (Signed)
Clinical Social Work Assessment  Patient Details  Name: Cody Velez MRN: [id number] Date of Birth: 04/30/1946  Date of referral:  03/16/18               Reason for consult:  Discharge Planning                Permission sought to share information with:  Facility Contact Representative Permission granted to share information::     Name::        Agency::  [facility]  Relationship::     Contact Information:     Housing/Transportation Living arrangements for the past 2 months:  Single Family Home Source of Information:  Patient Patient Interpreter Needed:  None Criminal Activity/Legal Involvement Pertinent to Current Situation/Hospitalization:  No - Comment as needed Significant Relationships:  Adult Children, Significant Other Lives with:  Significant Other Do you feel safe going back to the place where you live?  Yes Need for family participation in patient care:  No (Coment)  Care giving concerns: PT recommending SNF STR.   Social Worker assessment / plan: Pt is a 72 year old male referred to CSW for SNF STR placement. Met with pt and his sig other at bedside. Pt's daughter on speaker phone. Request is for [facility]. Dtr to speak with some others regarding back up choice. Will start referral to [facility]. Pt will also need insurance auth.  Employment status:  Retired Insurance information:  Managed Care PT Recommendations:  Skilled Nursing Facility Information / Referral to community resources:     Patient/Family's Response to care: Pt and family accepting of care.   Patient/Family's Understanding of and Emotional Response to Diagnosis, Current Treatment, and Prognosis: Pt understands and accepts recommendations. No emotional distress identified.  Emotional Assessment Appearance:  Appears stated age Attitude/Demeanor/Rapport:    Affect (typically observed):  Accepting Orientation:  Oriented to Self, Oriented to  Time, Oriented to Place, Oriented to Situation Alcohol / Substance use:  Not  Applicable Psych involvement (Current and /or in the community):  No (Comment)  Discharge Needs  Concerns to be addressed:  Discharge Planning Concerns Readmission within the last 30 days:    Current discharge risk:  None Barriers to Discharge:  No Barriers Identified    , LCSW 03/16/2018, 1:15 PM  

## 2018-03-16 NOTE — Evaluation (Signed)
Physical Therapy Evaluation Patient Details Name: Cody Velez MRN: [id number] DOB: September 10, 1946 Today's Date: 03/16/2018   History of Present Illness  Cody Velez is a 72 y.o. male with medical history significant for chronic back pain with recent spinal cord stimulator placement several weeks ago, nephrolithiasis with recent admission for UTI, hypertension, dyslipidemia, morbid obesity, and CAD who was brought to the emergency department by family members due to worsening generalized weakness that led to a fall at home.  He actually saw urology earlier today as a follow-up appointment after recent admission to [facility] in [place] where he was noted to have some bilateral nephrolithiasis.  He was apparently prescribed some doxycycline during his urology visit today, likely due to suspicion of urinary tract infection.  He has had progressive confusion throughout the day and was noted to be hypoxemic upon arrival as well as hypoxemic during his urology visit earlier.    Clinical Impression  Patient required legs supported when sitting up due to sharpe low back pain, limited to a few steps at bedside due c/o nausea and generalized weakness.  Patient tolerated sitting up in chair with his caregiver assisting him with eating  Breakfast - RN notified.  Patient will benefit from continued physical therapy in hospital and recommended venue below to increase strength, balance, endurance for safe ADLs and gait.    Follow Up Recommendations SNF;Supervision/Assistance - 24 hour    Equipment Recommendations  None recommended by PT    Recommendations for Other Services       Precautions / Restrictions Precautions Precautions: Fall Restrictions Weight Bearing Restrictions: No      Mobility  Bed Mobility Overal bed mobility: Needs Assistance Bed Mobility: Rolling;Sidelying to Sit Rolling: Supervision Sidelying to sit: Min guard          Transfers Overall transfer level: Needs  assistance Equipment used: Rolling walker (2 wheeled) Transfers: Sit to/from [facility] Sit to Stand: Min assist Stand pivot transfers: Min assist          Ambulation/Gait Ambulation/Gait assistance: Min assist;Mod assist Ambulation Distance (Feet): 5 Feet Assistive device: Rolling walker (2 wheeled) Gait Pattern/deviations: Decreased step length - right;Decreased step length - left;Decreased stride length   Gait velocity interpretation: Below normal speed for age/gender General Gait Details: limited to 6-7 side steps at bedside due to c/o fatigue and nausea  Stairs            Wheelchair Mobility    Modified Rankin (Stroke Patients Only)       Balance Overall balance assessment: Needs assistance Sitting-balance support: Feet supported;No upper extremity supported Sitting balance-Leahy Scale: Good     Standing balance support: Bilateral upper extremity supported;During functional activity Standing balance-Leahy Scale: Fair                               Pertinent Vitals/Pain Pain Assessment: Faces Faces Pain Scale: Hurts whole lot Pain Location: low back Pain Descriptors / Indicators: Sharp;Aching;Grimacing;Guarding Pain Intervention(s): Limited activity within patient's tolerance;Monitored during session    [place] expects to be discharged to:: Private residence Living Arrangements: Non-relatives/Friends Available Help at Discharge: Friend(s) Type of Home: House Home Access: Stairs to enter Entrance Stairs-Rails: Right;Left;Can reach both Entrance Stairs-Number of Steps: 7 Home Layout: Two level;Able to live on main level with bedroom/bathroom Home Equipment: Kasandra Knudsen - single point;Walker - 2 wheels      Prior Function Level of Independence: Independent  Hand Dominance        Extremity/Trunk Assessment   Upper Extremity Assessment Upper Extremity Assessment: Generalized weakness     Lower Extremity Assessment Lower Extremity Assessment: Generalized weakness    Cervical / Trunk Assessment Cervical / Trunk Assessment: Normal  Communication   Communication: No difficulties  Cognition Arousal/Alertness: Awake/alert Behavior During Therapy: WFL for tasks assessed/performed Overall Cognitive Status: Within Functional Limits for tasks assessed                                        General Comments      Exercises     Assessment/Plan    PT Assessment Patient needs continued PT services  PT Problem List Decreased strength;Decreased activity tolerance;Decreased balance;Decreased mobility       PT Treatment Interventions Gait training;Functional mobility training;Therapeutic activities;Therapeutic exercise;Stair training;Patient/family education    PT Goals (Current goals can be found in the Care Plan section)  Acute Rehab PT Goals Patient Stated Goal: return home PT Goal Formulation: With patient/family Time For Goal Achievement: 03/30/18 Potential to Achieve Goals: Good    Frequency Min 3X/week   Barriers to discharge        Co-evaluation               AM-PAC PT "6 Clicks" Daily Activity  Outcome Measure Difficulty turning over in bed (including adjusting bedclothes, sheets and blankets)?: None Difficulty moving from lying on back to sitting on the side of the bed? : A Little Difficulty sitting down on and standing up from a chair with arms (e.g., wheelchair, bedside commode, etc,.)?: A Little Help needed moving to and from a bed to chair (including a wheelchair)?: A Little Help needed walking in hospital room?: A Lot Help needed climbing 3-5 steps with a railing? : A Lot 6 Click Score: 17    End of Session   Activity Tolerance: Patient limited by pain;Patient limited by fatigue(Patient limited by nausea) Patient left: in chair;with call bell/phone within reach;with family/visitor present Nurse Communication: Mobility  status;Other (comment)(RN notified that patient left up in chair) PT Visit Diagnosis: Unsteadiness on feet (R26.81);Other abnormalities of gait and mobility (R26.89);Muscle weakness (generalized) (M62.81)    Time: 6283-6629 PT Time Calculation (min) (ACUTE ONLY): 25 min   Charges:   PT Evaluation $PT Eval Moderate Complexity: 1 Mod PT Treatments $Therapeutic Activity: 23-37 mins   PT G Codes:        10:38 AM, 04/14/18 Lonell Grandchild, MPT Physical Therapist with [facility] [phone] office [phone] mobile phone

## 2018-03-16 NOTE — ED Notes (Signed)
CONTACT INFORMATION Knute Neu [phone]

## 2018-03-16 NOTE — NC FL2 (Signed)
[place] LEVEL OF CARE SCREENING TOOL     IDENTIFICATION  Patient Name: Cody Velez Birthdate: [birth year] Sex: male Admission Date (Current Location): 03/15/2018  [place] and [place] Number:  [employer] and Address:  [facility] [address], [place]      Provider Number: [id number]  Attending Physician Name and Address:  Orson Eva, MD  Relative Name and Phone Number:  Knute Neu (Daughter) [phone]    Current Level of Care:   Recommended Level of Care: [place] Prior Approval Number:    Date Approved/Denied:   PASRR Number: pending  Discharge Plan: SNF    Current Diagnoses: Patient Active Problem List   Diagnosis Date Noted  . Weakness 03/16/2018  . Fever 03/16/2018  . Sepsis ([place]) 03/16/2018  . Nephrolithiasis 03/16/2018  . AKI (acute kidney injury) ([place]) 03/16/2018  . Sepsis due to undetermined organism ([place]) 03/16/2018  . Acute metabolic encephalopathy 09/32/6712  . Cognitive impairment 03/16/2018  . Chronic pain syndrome 03/16/2018  . Cervicogenic headache 01/28/2016  . B12 deficiency 01/28/2016  . Chest pain 10/08/2015  . Memory difficulty 07/23/2015  . History of cerebrovascular disease 07/23/2015  . Unspecified constipation 08/13/2014  . Fecal soiling 08/13/2014  . FH: colon cancer 08/13/2014  . Hx of adenomatous colonic polyps 08/13/2014  . Cough with hemoptysis 01/01/2014  . Chronic back pain 05/30/2012  . CAD, NATIVE VESSEL 07/17/2010  . HYPERCHOLESTEROLEMIA 10/31/2009  . SMOKELESS TOBACCO ABUSE 10/31/2009  . Obstructive sleep apnea 10/31/2009  . Essential hypertension, benign 10/31/2009  . GERD 10/31/2009    Orientation RESPIRATION BLADDER Height & Weight     Self, Time, Situation, Place    Continent Weight: 300 lb (136.1 kg) Height:  6\' 3"  (190.5 cm)  BEHAVIORAL SYMPTOMS/MOOD NEUROLOGICAL BOWEL NUTRITION STATUS      Continent Diet(heart healthy)  AMBULATORY  STATUS COMMUNICATION OF NEEDS Skin   Extensive Assist Verbally Normal                       Personal Care Assistance Level of Assistance  Bathing, Feeding, Dressing Bathing Assistance: Limited assistance Feeding assistance: Independent Dressing Assistance: Limited assistance     Functional Limitations Info  Sight, Hearing, Speech Sight Info: Adequate Hearing Info: Adequate Speech Info: Adequate    SPECIAL CARE FACTORS FREQUENCY  PT (By licensed PT)     PT Frequency: 5 times week              Contractures Contractures Info: Not present    Additional Factors Info  Code Status, Allergies, Psychotropic Code Status Info: full Allergies Info: no known allergies Psychotropic Info: effexor         Current Medications (03/16/2018):  This is the current hospital active medication list Current Facility-Administered Medications  Medication Dose Route Frequency Provider Last Rate Last Dose  . 0.9 % NaCl with KCl 20 mEq/ L  infusion   Intravenous Continuous Tat, David, MD 100 mL/hr at 03/16/18 0932    . acetaminophen (TYLENOL) tablet 650 mg  650 mg Oral Q6H PRN Manuella Ghazi, Pratik D, DO       Or  . acetaminophen (TYLENOL) suppository 650 mg  650 mg Rectal Q6H PRN Manuella Ghazi, Pratik D, DO      . amLODipine (NORVASC) tablet 10 mg  10 mg Oral Daily Manuella Ghazi, Pratik D, DO   10 mg at 03/16/18 4580  . aspirin EC tablet 81 mg  81 mg Oral Daily Manuella Ghazi,  Pratik D, DO   81 mg at 03/16/18 0921  . cycloSPORINE (RESTASIS) 0.05 % ophthalmic emulsion 1 drop  1 drop Both Eyes BID Manuella Ghazi, Pratik D, DO      . docusate sodium (COLACE) capsule 200 mg  200 mg Oral BID Manuella Ghazi, Pratik D, DO   200 mg at 03/16/18 7425  . donepezil (ARICEPT) tablet 10 mg  10 mg Oral QHS Shah, Pratik D, DO      . enoxaparin (LOVENOX) injection 40 mg  40 mg Subcutaneous Q24H Manuella Ghazi, Pratik D, DO   40 mg at 03/16/18 9563  . finasteride (PROSCAR) tablet 5 mg  5 mg Oral Daily Manuella Ghazi, Pratik D, DO   5 mg at 03/16/18 1226  . fluticasone (FLONASE) 50  MCG/ACT nasal spray 1 spray  1 spray Each Nare Daily PRN Manuella Ghazi, Pratik D, DO      . gabapentin (NEURONTIN) capsule 900 mg  900 mg Oral TID Manuella Ghazi, Pratik D, DO   900 mg at 03/16/18 8756  . hydrALAZINE (APRESOLINE) injection 10 mg  10 mg Intravenous Q4H PRN Manuella Ghazi, Pratik D, DO      . latanoprost (XALATAN) 0.005 % ophthalmic solution 1 drop  1 drop Both Eyes QHS Manuella Ghazi, Pratik D, DO      . memantine (NAMENDA) tablet 10 mg  10 mg Oral BID Manuella Ghazi, Pratik D, DO   10 mg at 03/16/18 4332  . ondansetron (ZOFRAN) tablet 4 mg  4 mg Oral Q6H PRN Manuella Ghazi, Pratik D, DO       Or  . ondansetron (ZOFRAN) injection 4 mg  4 mg Intravenous Q6H PRN Manuella Ghazi, Pratik D, DO      . oxyCODONE-acetaminophen (PERCOCET/ROXICET) 5-325 MG per tablet 2 tablet  2 tablet Oral Q6H PRN Manuella Ghazi, Pratik D, DO   2 tablet at 03/16/18 0931  . pantoprazole (PROTONIX) EC tablet 40 mg  40 mg Oral Daily Manuella Ghazi, Pratik D, DO   40 mg at 03/16/18 9518  . piperacillin-tazobactam (ZOSYN) IVPB 3.375 g  3.375 g Intravenous Q8H Shah, Pratik D, DO   Stopped at 03/16/18 0932  . pravastatin (PRAVACHOL) tablet 10 mg  10 mg Oral QPM Shah, Pratik D, DO      . traZODone (DESYREL) tablet 100 mg  100 mg Oral QHS Manuella Ghazi, Pratik D, DO      . [START ON 03/17/2018] vancomycin (VANCOCIN) 1,500 mg in sodium chloride 0.9 % 500 mL IVPB  1,500 mg Intravenous Q24H Tat, Shanon Brow, MD      . venlafaxine XR (EFFEXOR-XR) 24 hr capsule 150 mg  150 mg Oral BID Manuella Ghazi, Pratik D, DO   150 mg at 03/16/18 8416  . vitamin B-12 (CYANOCOBALAMIN) tablet 1,000 mcg  1,000 mcg Oral Daily Heath Lark D, DO   1,000 mcg at 03/16/18 6063     Discharge Medications: Please see discharge summary for a list of discharge medications.  Relevant Imaging Results:  Relevant Lab Results:   Additional Information SSN: [id number] [address], LCSW

## 2018-03-16 NOTE — Progress Notes (Signed)
Pharmacy Antibiotic Note  Cody Velez is a 72 y.o. male admitted on 03/15/2018 with sepsis.  Pharmacy has been consulted for Vancomycin and Zosyn dosing.  Plan: Vancomycin 2000 mg IV x 1 dose Vancomycin 1500 mg IV every 24 hours.  Goal trough 15-20 mcg/mL. Zosyn 3.375g IV q8h (4 hour infusion).  Monitor labs, c/s, and vanco trough if needed  Height: 6\' 3"  (190.5 cm) Weight: 300 lb (136.1 kg) IBW/kg (Calculated) : 84.5  Temp (24hrs), Avg:100.3 F (37.9 C), Min:98 F (36.7 C), Max:102.4 F (39.1 C)  Recent Labs  Lab 03/15/18 1859 03/15/18 2006 03/16/18 0429  WBC 25.7*  --  22.9*  CREATININE 2.09*  --  1.53*  LATICACIDVEN  --  1.84 1.1    Estimated Creatinine Clearance: 65.8 mL/min (A) (by C-G formula based on SCr of 1.53 mg/dL (H)).    No Known Allergies  Antimicrobials this admission: Vancomycin 3/28 >>  Zosyn 3/28 >>   Dose adjustments this admission: N/A  Microbiology results: 3/27 BCx: pending 3/27 UCx: pending    Thank you for allowing pharmacy to be a part of this patient's care.  Ramond Craver 03/16/2018 11:14 AM

## 2018-03-16 NOTE — H&P (Addendum)
History and Physical    Cody Velez:[id number] DOB: December 27, 1945 DOA: 03/15/2018  PCP: Asencion Noble, MD   Patient coming from: Home  Chief Complaint: Generalized weakness with falls and fever  HPI: Cody Velez is a 72 y.o. male with medical history significant for chronic back pain with recent spinal cord stimulator placement several weeks ago, nephrolithiasis with recent admission for UTI, hypertension, dyslipidemia, morbid obesity, and CAD who was brought to the emergency department by family members due to worsening generalized weakness that led to a fall at home.  He actually saw urology earlier today as a follow-up appointment after recent admission to [facility] in [place] where he was noted to have some bilateral nephrolithiasis.  He was apparently prescribed some doxycycline during his urology visit today, likely due to suspicion of urinary tract infection.  He has had progressive confusion throughout the day and was noted to be hypoxemic upon arrival as well as hypoxemic during his urology visit earlier. He denies any dyspnea or chest pain.  No lower extremity edema, orthopnea, or dyspnea on exertion.  He reportedly had a stress test with no concerning findings during this recent hospitalization. He complains of ongoing low back pain that is chronic for him.    ED Course: Vital signs are noted to be stable, but he was noted to be febrile with temperature 102 Fahrenheit upon arrival here.  This has improved to 65 Fahrenheit currently.  He was also initially noted to be somewhat tachycardic.  His laboratory data reveals leukocytosis of 25,700, mild anemia of 11.4 hemoglobin, glucose 158, lactic acid 1.84, and creatinine 2.09 with baseline of approximately 1 back in 2017.  CT of the chest with angiogram along with abdominal CT was performed with no acute findings aside from bilateral renal calculi that are nonobstructing and a 4 mm left lower lobe pulmonary nodule with suggestion to follow-up  in 12 months.  He has received 1 L of IV fluid along with Zosyn.  He is currently resting on his side on 2 L nasal cannula and appears to be in no acute distress.  His initial tachycardia has improved with fluid bolus.  Review of Systems: All others reviewed and otherwise negative.  Past Medical History:  Diagnosis Date  . Allergic rhinitis   . Arthritis   . Asthma    as a child  . B12 deficiency   . Cancer [facility])    prostate  . Cervicogenic headache 01/28/2016  . Chronic back pain   . Coronary atherosclerosis of native coronary artery    Mild nonobstructive CAD at catheterization January 2015  . Depression   . Essential hypertension, benign   . Falls   . GERD (gastroesophageal reflux disease)   . History of cerebrovascular disease 07/23/2015  . History of pneumonia 02/2011  . Hyperlipidemia   . Kidney stone   . Memory difficulty 07/23/2015  . OSA (obstructive sleep apnea)    CPAP - Dr. [name]  . Prostate cancer ([place])   . PTSD (post-traumatic stress disorder)    [place]  . Rectal bleeding     Past Surgical History:  Procedure Laterality Date  . BACK SURGERY  02/14/12   lumbar OR #7; "today redid L1L2; replaced screws; added bone from hip"  . BILATERAL KNEE ARTHROSCOPY    . COLONOSCOPY  10/15/2008   Dr. [name]: tubular adenoma   . COLONOSCOPY  12/17/2003   WJX:BJYNWG rectal and colon  . COLONOSCOPY N/A 09/05/2014   Procedure: COLONOSCOPY;  Surgeon: Herbie Baltimore  Hilton Cork, MD;  Location: [facility] ENDO SUITE;  Service: Endoscopy;  Laterality: N/A;  7:30-rescheduled 9/17 to [place] notified pt  . CYSTOSCOPY WITH STENT PLACEMENT Right 01/27/2016   Procedure: CYSTOSCOPY WITH STENT PLACEMENT;  Surgeon: [name], MD;  Location: [facility] ORS;  Service: Urology;  Laterality: Right;  . CYSTOSCOPY/RETROGRADE/URETEROSCOPY/STONE EXTRACTION WITH BASKET Right 01/27/2016   Procedure: CYSTOSCOPY, RIGHT RETROGRADE, RIGHT URETEROSCOPY, STONE EXTRACTION ;  Surgeon: [name], MD;  Location: [facility] ORS;   Service: Urology;  Laterality: Right;  . ESOPHAGOGASTRODUODENOSCOPY  10/15/2008     Dr Rourk:Schatzki's ring status post dilation and disruption via 68 F Maloney dilator/ otherwise unremarkable esophagus, small hiatal hernia, multiple fundal gland polyps not manipulated, gastritis, negative H.pylori  . ESOPHAGOGASTRODUODENOSCOPY  06/21/02   XBD:ZHGDJ sliding hiatal hernia with mild changes of reflux esophagitis limited to gastroesophageal junction.  Noncritical ring at distal esophagus, 3 cm proximal to gastroesophageal junction/Antral gastritis  . [place]   "broke face playing softball"  . FRACTURE SURGERY     "left wrist; broke it; took spur off"  . HOLMIUM LASER APPLICATION Right 01/23/2682   Procedure: HOLMIUM LASER APPLICATION;  Surgeon: [name], MD;  Location: [facility] ORS;  Service: Urology;  Laterality: Right;  . LEFT HEART CATHETERIZATION WITH CORONARY ANGIOGRAM N/A 12/27/2013   Procedure: LEFT HEART CATHETERIZATION WITH CORONARY ANGIOGRAM;  Surgeon: [name], MD;  Location: [facility] CATH LAB;  Service: Cardiovascular;  Laterality: N/A;  . neck epidural    . SHOULDER SURGERY Bilateral      reports that he quit smoking about 59 years ago. His smoking use included cigarettes. His smokeless tobacco use includes chew. He reports that he drinks alcohol. He reports that he does not use drugs.  No Known Allergies  Family History  Problem Relation Age of Onset  . Emphysema Father   . Heart failure Father   . Lung cancer Father   . CAD Father   . Colon cancer Mother   . Stroke Mother   . Breast cancer Mother   . Stroke Sister   . Heart attack Brother   . Dementia Paternal Uncle   . Emphysema Maternal Grandmother   . Stroke Maternal Grandmother   . Asthma Other        grandson  . Heart disease Paternal Grandfather   . Anesthesia problems Neg Hx   . Hypotension Neg Hx   . Malignant hyperthermia Neg Hx   . Pseudochol deficiency Neg Hx     Prior to  Admission medications   Medication Sig Start Date End Date Taking? Authorizing Provider  amLODipine (NORVASC) 10 MG tablet Take 10 mg by mouth daily.   Yes [provider]  aspirin 81 MG tablet Take 81 mg by mouth daily.   Yes [provider]  baclofen (LIORESAL) 10 MG tablet Take 10 mg by mouth 3 (three) times daily.   Yes [provider]  cycloSPORINE (RESTASIS) 0.05 % ophthalmic emulsion 1 DROP INTO BOTH EYES TWICE A DAY FOR DRY EYES 12/02/17 12/03/18 Yes [provider]  docusate sodium (COLACE) 100 MG capsule Take 200 mg by mouth 2 (two) times daily.   Yes [provider]  donepezil (ARICEPT) 10 MG tablet Take 1 tablet (10 mg total) by mouth at bedtime. 06/24/16  Yes Kathrynn Ducking, MD  doxycycline (VIBRA-TABS) 100 MG tablet Take 100 mg by mouth 2 (two) times daily. 7 day course starting on 03/15/18 03/15/18  Yes [provider]  finasteride (PROSCAR) 5 MG tablet Take 5 mg by mouth daily. Reported on 05/04/2016   Yes [provider]  fluticasone (FLONASE) 50 MCG/ACT nasal spray Place 1 spray into both nostrils daily as needed for allergies.    Yes [provider]  gabapentin (NEURONTIN) 300 MG capsule Take 3 capsules (900 mg total) by mouth 3 (three) times daily. 11/17/17  Yes Kathrynn Ducking, MD  ibuprofen (ADVIL,MOTRIN) 600 MG tablet Take 600 mg by mouth 3 (three) times daily.   Yes [provider]  latanoprost (XALATAN) 0.005 % ophthalmic solution Place 1 drop into both eyes at bedtime.  02/12/18  Yes [provider]  losartan (COZAAR) 100 MG tablet Take 100 mg by mouth every morning.    Yes [provider]  memantine (NAMENDA) 10 MG tablet Take 1 tablet (10 mg total) by mouth 2 (two) times daily. 12/21/17  Yes Kathrynn Ducking, MD  omeprazole (PRILOSEC) 20 MG capsule Take 40 mg by mouth 2 (two) times daily before a meal.    Yes [provider]  oxyCODONE-acetaminophen (PERCOCET/ROXICET)  5-325 MG tablet Take 2 tablets by mouth every 6 (six) hours as needed for severe pain.   Yes [provider]  pravastatin (PRAVACHOL) 10 MG tablet Take 10 mg by mouth every evening.    Yes [provider]  traZODone (DESYREL) 100 MG tablet Take 100 mg by mouth at bedtime.   Yes [provider]  venlafaxine XR (EFFEXOR-XR) 150 MG 24 hr capsule Take 150 mg by mouth 2 (two) times daily.    Yes [provider]  vitamin B-12 (CYANOCOBALAMIN) 1000 MCG tablet Take 1,000 mcg by mouth daily.   Yes [provider]  sildenafil (VIAGRA) 100 MG tablet Take 100 mg by mouth daily as needed for erectile dysfunction.    [provider]    Physical Exam: Vitals:   03/15/18 2300 03/15/18 2330 03/16/18 0000 03/16/18 0030  BP: (!) 144/85 (!) 144/88 (!) 142/109 (!) 168/97  Pulse: 91 89 98 94  Resp: (!) 24 17 (!) 22 (!) 23  Temp:      TempSrc:      SpO2: 95% 96% 91% 95%  Weight:      Height:        Constitutional: NAD, calm, comfortable; obese Vitals:   03/15/18 2300 03/15/18 2330 03/16/18 0000 03/16/18 0030  BP: (!) 144/85 (!) 144/88 (!) 142/109 (!) 168/97  Pulse: 91 89 98 94  Resp: (!) 24 17 (!) 22 (!) 23  Temp:      TempSrc:      SpO2: 95% 96% 91% 95%  Weight:      Height:       Eyes: lids and conjunctivae normal ENMT: Mucous membranes are moist.  Neck: normal, supple Respiratory: clear to auscultation bilaterally. Normal respiratory effort. No accessory muscle use. On 2L [place] Cardiovascular: Regular rate and rhythm, mildly tachycardic, no murmurs. No extremity edema. Abdomen: no tenderness, no distention. Bowel sounds positive.  Musculoskeletal:  No joint deformity upper and lower extremities.   Skin: no rashes, lesions, ulcers.  Psychiatric: Normal judgment and insight. Alert and oriented x 3. Normal mood.   Labs on Admission: I have personally reviewed following labs and imaging studies  CBC: Recent Labs  Lab 03/15/18 1859  WBC 25.7*   NEUTROABS 24.2*  HGB 11.4*  HCT 35.3*  MCV 83.6  PLT 128*   Basic Metabolic Panel: Recent Labs  Lab 03/15/18 1859  NA 137  K 3.7  CL 101  CO2 21*  GLUCOSE 158*  BUN 27*  CREATININE 2.09*  CALCIUM 9.2   GFR: Estimated Creatinine Clearance: 48.2 mL/min (A) (by C-G formula based on SCr of 2.09 mg/dL (H)). Liver Function Tests: Recent Labs  Lab 03/15/18 1859  AST 50*  ALT 55  ALKPHOS 118  BILITOT 1.4*  PROT 7.7  ALBUMIN 2.7*   No results for input(s): LIPASE, AMYLASE in the last 168 hours. No results for input(s): AMMONIA in the last 168 hours. Coagulation Profile: No results for input(s): INR, PROTIME in the last 168 hours. Cardiac Enzymes: Recent Labs  Lab 03/15/18 1859  TROPONINI <0.03   BNP (last 3 results) No results for input(s): PROBNP in the last 8760 hours. HbA1C: No results for input(s): HGBA1C in the last 72 hours. CBG: No results for input(s): GLUCAP in the last 168 hours. Lipid Profile: No results for input(s): CHOL, HDL, LDLCALC, TRIG, CHOLHDL, LDLDIRECT in the last 72 hours. Thyroid Function Tests: No results for input(s): TSH, T4TOTAL, FREET4, T3FREE, THYROIDAB in the last 72 hours. Anemia Panel: No results for input(s): VITAMINB12, FOLATE, FERRITIN, TIBC, IRON, RETICCTPCT in the last 72 hours. Urine analysis:    Component Value Date/Time   COLORURINE AMBER (A) 03/15/2018 1830   APPEARANCEUR CLOUDY (A) 03/15/2018 1830   LABSPEC 1.029 03/15/2018 1830   PHURINE 5.0 03/15/2018 1830   GLUCOSEU 50 (A) 03/15/2018 1830   HGBUR MODERATE (A) 03/15/2018 1830   BILIRUBINUR MODERATE (A) 03/15/2018 1830   KETONESUR NEGATIVE 03/15/2018 1830   PROTEINUR 100 (A) 03/15/2018 1830   UROBILINOGEN 0.2 03/05/2011 1909   NITRITE NEGATIVE 03/15/2018 1830   LEUKOCYTESUR NEGATIVE 03/15/2018 1830    Radiological Exams on Admission: Ct Angio Chest Pe W And/or Wo Contrast  Result Date: 03/15/2018 CLINICAL DATA:  72 year old male with acute generalized  abdominal pain and fever. EXAM: CT ANGIOGRAPHY CHEST CT ABDOMEN AND PELVIS WITH CONTRAST TECHNIQUE: Multidetector CT imaging of the chest was performed using the standard protocol during bolus administration of intravenous contrast. Multiplanar CT image reconstructions and MIPs were obtained to evaluate the vascular anatomy. Multidetector CT imaging of the abdomen and pelvis was performed using the standard protocol during bolus administration of intravenous contrast. CONTRAST:  52mL ISOVUE-370 IOPAMIDOL (ISOVUE-370) INJECTION 76% COMPARISON:  Abdominal CT dated 03/15/2018 FINDINGS: CTA CHEST FINDINGS Cardiovascular: Top-normal cardiac size. No pericardial effusion. Mild atherosclerotic calcification of the thoracic aorta. No aneurysmal dilatation or evidence of dissection. The origins of the great vessels of the aortic arch are patent. Evaluation of the pulmonary arteries is limited due to suboptimal opacification and respiratory motion artifact. No large or central pulmonary artery embolus identified. Mediastinum/Nodes: There is no hilar or mediastinal adenopathy. Esophagus and the thyroid gland are grossly unremarkable. No mediastinal fluid collection. Lungs/Pleura: Bibasilar linear and streaky densities, likely atelectasis/scarring. There is a 4 mm left lower lobe nodule (series 6, image 87). There is no focal consolidation, pleural effusion, or pneumothorax. The central airways are patent. Musculoskeletal: There is degenerative changes of the spine. No acute fracture. Stimulator device extending into the upper thoracic spine. Review of the MIP images confirms the above findings. CT ABDOMEN and PELVIS FINDINGS No intra-abdominal free air or free fluid. Hepatobiliary: Apparent mild fatty infiltration of the liver. No intrahepatic biliary ductal dilatation. The gallbladder is unremarkable. Pancreas: Unremarkable. No pancreatic ductal dilatation or surrounding inflammatory changes. Spleen: Normal in size without  focal abnormality. Adrenals/Urinary Tract: The adrenal glands are unremarkable. Small nonobstructing bilateral renal calculi measure up to 5 mm  in the upper pole of the left kidney. There is no hydronephrosis on either side. A 1 cm hypodense lesion from the superior pole of the right kidney demonstrates fluid attenuation most consistent with a cyst. The visualized ureters and urinary bladder appear unremarkable. Stomach/Bowel: Small hiatal hernia. There is no bowel obstruction or active inflammation. Air is noted throughout the colon. The appendix is normal. Vascular/Lymphatic: Mild aortoiliac atherosclerotic disease. No portal venous gas. There is no adenopathy. Reproductive: The prostate and seminal vesicles are grossly unremarkable. Other: Small fat containing bilateral inguinal and umbilical hernias. No fluid collection or inflammatory changes. Musculoskeletal: There is osteopenia with multilevel degenerative changes. L1-L3 posterior fusion hardware and L4-L5 and L5-S1 interbody disc spacer. No acute osseous pathology. Review of the MIP images confirms the above findings. IMPRESSION: 1. No acute intrathoracic, abdominal, or pelvic pathology. 2. Mild fatty liver. 3. Small nonobstructing bilateral renal calculi.  No hydronephrosis. 4. Mild air distention of the colon. No bowel obstruction or active inflammation. Normal appendix. 5.  Aortic Atherosclerosis (ICD10-I70.0). 6. A 4 mm left lower lobe pulmonary nodule. No follow-up needed if patient is low-risk. Non-contrast chest CT can be considered in 12 months if patient is high-risk. This recommendation follows the consensus statement: Guidelines for Management of Incidental Pulmonary Nodules Detected on CT Images: From the Fleischner Society 2017; Radiology 2017; [phone]. 7. Electronically Signed   By: Anner Crete M.D.   On: 03/15/2018 22:25   Ct Abdomen Pelvis W Contrast  Result Date: 03/15/2018 CLINICAL DATA:  72 year old male with acute generalized  abdominal pain and fever. EXAM: CT ANGIOGRAPHY CHEST CT ABDOMEN AND PELVIS WITH CONTRAST TECHNIQUE: Multidetector CT imaging of the chest was performed using the standard protocol during bolus administration of intravenous contrast. Multiplanar CT image reconstructions and MIPs were obtained to evaluate the vascular anatomy. Multidetector CT imaging of the abdomen and pelvis was performed using the standard protocol during bolus administration of intravenous contrast. CONTRAST:  33mL ISOVUE-370 IOPAMIDOL (ISOVUE-370) INJECTION 76% COMPARISON:  Abdominal CT dated 03/15/2018 FINDINGS: CTA CHEST FINDINGS Cardiovascular: Top-normal cardiac size. No pericardial effusion. Mild atherosclerotic calcification of the thoracic aorta. No aneurysmal dilatation or evidence of dissection. The origins of the great vessels of the aortic arch are patent. Evaluation of the pulmonary arteries is limited due to suboptimal opacification and respiratory motion artifact. No large or central pulmonary artery embolus identified. Mediastinum/Nodes: There is no hilar or mediastinal adenopathy. Esophagus and the thyroid gland are grossly unremarkable. No mediastinal fluid collection. Lungs/Pleura: Bibasilar linear and streaky densities, likely atelectasis/scarring. There is a 4 mm left lower lobe nodule (series 6, image 87). There is no focal consolidation, pleural effusion, or pneumothorax. The central airways are patent. Musculoskeletal: There is degenerative changes of the spine. No acute fracture. Stimulator device extending into the upper thoracic spine. Review of the MIP images confirms the above findings. CT ABDOMEN and PELVIS FINDINGS No intra-abdominal free air or free fluid. Hepatobiliary: Apparent mild fatty infiltration of the liver. No intrahepatic biliary ductal dilatation. The gallbladder is unremarkable. Pancreas: Unremarkable. No pancreatic ductal dilatation or surrounding inflammatory changes. Spleen: Normal in size without  focal abnormality. Adrenals/Urinary Tract: The adrenal glands are unremarkable. Small nonobstructing bilateral renal calculi measure up to 5 mm in the upper pole of the left kidney. There is no hydronephrosis on either side. A 1 cm hypodense lesion from the superior pole of the right kidney demonstrates fluid attenuation most consistent with a cyst. The visualized ureters and urinary bladder appear unremarkable. Stomach/Bowel: Small hiatal  hernia. There is no bowel obstruction or active inflammation. Air is noted throughout the colon. The appendix is normal. Vascular/Lymphatic: Mild aortoiliac atherosclerotic disease. No portal venous gas. There is no adenopathy. Reproductive: The prostate and seminal vesicles are grossly unremarkable. Other: Small fat containing bilateral inguinal and umbilical hernias. No fluid collection or inflammatory changes. Musculoskeletal: There is osteopenia with multilevel degenerative changes. L1-L3 posterior fusion hardware and L4-L5 and L5-S1 interbody disc spacer. No acute osseous pathology. Review of the MIP images confirms the above findings. IMPRESSION: 1. No acute intrathoracic, abdominal, or pelvic pathology. 2. Mild fatty liver. 3. Small nonobstructing bilateral renal calculi.  No hydronephrosis. 4. Mild air distention of the colon. No bowel obstruction or active inflammation. Normal appendix. 5.  Aortic Atherosclerosis (ICD10-I70.0). 6. A 4 mm left lower lobe pulmonary nodule. No follow-up needed if patient is low-risk. Non-contrast chest CT can be considered in 12 months if patient is high-risk. This recommendation follows the consensus statement: Guidelines for Management of Incidental Pulmonary Nodules Detected on CT Images: From the Fleischner Society 2017; Radiology 2017; [phone]. 7. Electronically Signed   By: Anner Crete M.D.   On: 03/15/2018 22:25   Dg Chest Portable 1 View  Result Date: 03/15/2018 CLINICAL DATA:  72 year old male status post fall getting  into car today. Confusion beginning about 1630 hours. Shortness of breath. EXAM: PORTABLE CHEST 1 VIEW COMPARISON:  Portable chest 05/04/2016 and earlier. FINDINGS: Portable AP upright view at 1854 hours. Chronic cardiomegaly. Stable mediastinal contours. Visualized tracheal air column is within normal limits. Mild respiratory motion artifact. Suggestion of increased pulmonary vascularity compared to 2017. No pneumothorax, pleural effusion or consolidation is evident. Prior cervical spine surgery with spinal stimulator device coursing to the cervical region. No acute osseous abnormality identified. Negative visible bowel gas pattern. IMPRESSION: Cardiomegaly with appearance of increased pulmonary vascularity from prior studies. Consider acute pulmonary edema. Electronically Signed   By: Genevie Ann M.D.   On: 03/15/2018 19:09    EKG: Independently reviewed. Sinus tachycardia 113 bpm.  Assessment/Plan Principal Problem:   Sepsis ([place]) Active Problems:   HYPERCHOLESTEROLEMIA   Essential hypertension, benign   GERD   Chronic back pain   History of cerebrovascular disease   Weakness   Fever   Nephrolithiasis   AKI (acute kidney injury) ([place])    1. Sepsis with possible urinary source.  Continue with empiric IV antibiotics (Vanc and Zosyn) along with IV fluid, time-limited.  Follow blood and urine cultures.  Repeat lactic acid in a.m. 2. Generalized weakness with falls likely secondary to above.  Placed on fall precautions with PT evaluation and continue to treat sepsis for now. 3. AK I versus CKD likely secondary to sepsis.  Continue on IV fluid hydration.  CT does not demonstrate any hydronephrosis.  Patient did receive contrast with CT and therefore must be careful and monitor for CIN.  Repeat labs in morning and monitor I's and O's. Hold nephrotoxic agents. 4. Hypertension.  Patient currently has elevated blood pressure readings.  Will maintain on amlodipine and hold losartan on account of AK I that  is suspected.  Hydralazine pushes as needed. 5. Dyslipidemia.  Continue statin. 6. Depression with cognitive deficits and possible dementia.  Maintain on Namenda/Aricept as well as Effexor. 7. Chronic back pain.  Maintain on oral narcotics as well as gabapentin.   DVT prophylaxis: Lovenox, renally dosed Code Status: Full Family Communication: Friend at bedside Disposition Plan: Treat presumed sepsis with IV antibiotics as ordered and follow cultures and evaluate  for improvement in symptomatology. Consults called: None Admission status: Inpatient, med-surg   Latonja Bobeck Darleen Crocker DO [facility] Pager [phone]  If 7PM-7AM, please contact night-coverage [email or web address] Password [facility]  03/16/2018, 12:44 AM

## 2018-03-16 NOTE — Progress Notes (Addendum)
PROGRESS NOTE  Cody Velez [id number]:[id number] DOB: July 08, 1946 DOA: 03/15/2018 PCP: Asencion Noble, MD  Brief History:  72 year old male with a history of hypertension, hyperlipidemia, morbid obesity, coronary artery disease, chronic back pain status post spinal stimulator (C-spine) placed 01/18/18 presented with worsening confusion and a mechanical fall at home.  The patient significant other contributes to the history at this time as he is not able to provide good history secondary to his encephalopathy.  Apparently, the patient has had subjective fevers and chills for the better part of one month.  In the past week, he has had worsening confusion.  On 03/11/2018, the patient was returning from a trip from [facility].  Apparently he fell in the bathroom at [facility] and was not able to get up.  He was admitted to [facility]aria Ferries) on 03/11/2018 and discharged on 03/13/2018.  He was at his baseline at the time of discharge.  However, he has had increasing confusion on 03/14/2018 and 03/15/2018.  His significant other feels that the patient had been overusing his Percocet at home which may have been contributing to his falls and confusion.  However, she has taken away his Percocets since his discharge from [facility], he has not had any in the 2 days prior to this admission.  However, the patient has been increasing his use of ibuprofen for his pain.  The patient denies any worsening headache, neck pain, nausea, vomiting, diarrhea, abdominal pain, dysuria, hematuria.  He complains of left and right flank pain, left greater than right.  In the emergency department, the patient had fever 1 2.4 F with tachycardia.  He remained hemodynamically stable with oxygen saturation of 92% on room air.  CT angiogram of the chest was negative for pulmonary embolus or dissection.  There was no consolidation or pleural effusion.  CT abdomen and pelvis was unremarkable for acute findings.  There was nonobstructive bilateral renal  calculi.  The patient was started on Zosyn, IV fluids, and admitted for further workup. The patient followed up with urology on the morning of 03/15/2018.  There was concern for UTI.  The patient was prescribed doxycycline of which she took 1 dose prior to going to the emergency department.  Assessment/Plan: Sepsis -Source unclear, but suspect urine and bacteremia -Low suspicion of surgical site infection from his spinal stimulator and battery placement -Continue Zosyn pending culture data -Lactic acid peaked 1.8 -Check procalcitonin -Continue IV fluids -UA 6-30 WBC -Chest x-ray increased interstitial markings -Viral respiratory panel  Acute kidney injury -Baseline creatinine 1.0-1.2 -Presenting creatinine 2.09 -Secondary to sepsis and volume depletion in setting of NSAID use -Improving with IV fluids  Essential hypertension -Holding losartan in the setting of AKI -Continue amlodipine  Acute metabolic encephalopathy -Secondary to sepsis -Remains confused -Significant other states that he has not used his Percocet in the past 48 hours prior to admission  Cognitive impairment -Continue Aricept and Namenda  Chronic pain syndrome -Minimize hypnotic and opioids -Continue gabapentin--decrease dose for renal failure  Depression -Continue home dose of venlafaxine -Holding trazodone in the setting of encephalopathy  Hyperlipidemia -Continue statin  Coronary artery disease -No chest pain presently -Personally reviewed EKG--sinus rhythm, no ST-T wave changes -Personally reviewed chest x-ray-increased interstitial markings without consolidation  Hypokalemia -replete -check mag  Transaminasemia -due to sepsis -no RUQ pain -am CMP   Disposition Plan:   Home in 2-3 days  Family Communication:   Significant other updated at bedside--total time 45  min--0645 to 0730  Consultants:  none  Code Status:  FULL  DVT Prophylaxis:  [place] Lovenox   Procedures: As Listed in  Progress Note Above  Antibiotics: Zosyn 3/27>>>    Subjective: Patient complains of bilateral flank pain.  Denies any headache, neck pain, chest pain, nausea, vomiting, diarrhea, abdominal pain, dysuria, hematuria.  He has some shortness of breath.  He denies any coughing or hemoptysis.  Objective: Vitals:   03/16/18 0000 03/16/18 0030 03/16/18 0130 03/16/18 0200  BP: (!) 142/109 (!) 168/97 (!) 180/100 (!) 161/56  Pulse: 98 94 100 (!) 109  Resp: (!) 22 (!) 23 (!) 29 (!) 27  Temp:      TempSrc:      SpO2: 91% 95% 95% 95%  Weight:      Height:       No intake or output data in the 24 hours ending 03/16/18 0719 Weight change:  Exam:   General:  Pt is alert, follows commands appropriately, not in acute distress  HEENT: No icterus, No thrush, No neck mass, [place]/AT  Cardiovascular: RRR, S1/S2, no rubs, no gallops  Respiratory: Bibasilar crackles without wheezing.  Good air movement.  Abdomen: Soft/+BS, non tender, non distended, no guarding; mild tenderness of the left flank area.  There is visible ecchymosis.  Left flank battery site without any erythema, drainage, induration, edema.  Minimal tenderness to deep palpation.  Extremities: No edema, No lymphangitis, No petechiae, No rashes, no synovitis   Data Reviewed: I have personally reviewed following labs and imaging studies Basic Metabolic Panel: Recent Labs  Lab 03/15/18 1859 03/16/18 0429  NA 137 137  K 3.7 3.3*  CL 101 101  CO2 21* 23  GLUCOSE 158* 131*  BUN 27* 25*  CREATININE 2.09* 1.53*  CALCIUM 9.2 8.6*   Liver Function Tests: Recent Labs  Lab 03/15/18 1859  AST 50*  ALT 55  ALKPHOS 118  BILITOT 1.4*  PROT 7.7  ALBUMIN 2.7*   No results for input(s): LIPASE, AMYLASE in the last 168 hours. No results for input(s): AMMONIA in the last 168 hours. Coagulation Profile: No results for input(s): INR, PROTIME in the last 168 hours. CBC: Recent Labs  Lab 03/15/18 1859 03/16/18 0429  WBC 25.7*  22.9*  NEUTROABS 24.2*  --   HGB 11.4* 10.7*  HCT 35.3* 33.7*  MCV 83.6 83.4  PLT 435* 428*   Cardiac Enzymes: Recent Labs  Lab 03/15/18 1859  TROPONINI <0.03   BNP: Invalid input(s): POCBNP CBG: No results for input(s): GLUCAP in the last 168 hours. HbA1C: No results for input(s): HGBA1C in the last 72 hours. Urine analysis:    Component Value Date/Time   COLORURINE AMBER (A) 03/15/2018 1830   APPEARANCEUR CLOUDY (A) 03/15/2018 1830   LABSPEC 1.029 03/15/2018 1830   PHURINE 5.0 03/15/2018 1830   GLUCOSEU 50 (A) 03/15/2018 1830   HGBUR MODERATE (A) 03/15/2018 1830   BILIRUBINUR MODERATE (A) 03/15/2018 1830   KETONESUR NEGATIVE 03/15/2018 1830   PROTEINUR 100 (A) 03/15/2018 1830   UROBILINOGEN 0.2 03/05/2011 1909   NITRITE NEGATIVE 03/15/2018 1830   LEUKOCYTESUR NEGATIVE 03/15/2018 1830   Sepsis Labs: @LABRCNTIP (procalcitonin:4,lacticidven:4) ) Recent Results (from the past 240 hour(s))  Culture, blood (routine x 2)     Status: None (Preliminary result)   Collection Time: 03/15/18  8:00 PM  Result Value Ref Range Status   Specimen Description BLOOD RIGHT WRIST DRAWN BY RN  Final   Special Requests   Final    BOTTLES DRAWN  AEROBIC AND ANAEROBIC Blood Culture adequate volume Performed at [facility], [address], [place], [place] [address]    Culture PENDING  Incomplete   Report Status PENDING  Incomplete  Culture, blood (routine x 2)     Status: None (Preliminary result)   Collection Time: 03/15/18  8:21 PM  Result Value Ref Range Status   Specimen Description BLOOD LEFT HAND  Final   Special Requests   Final    BOTTLES DRAWN AEROBIC AND ANAEROBIC Blood Culture adequate volume Performed at [facility], [address], [place], [place] [address]    Culture PENDING  Incomplete   Report Status PENDING  Incomplete     Scheduled Meds: . amLODipine  10 mg Oral Daily  . aspirin EC  81 mg Oral Daily  . cycloSPORINE  1 drop Both Eyes BID  . docusate sodium   200 mg Oral BID  . donepezil  10 mg Oral QHS  . enoxaparin (LOVENOX) injection  40 mg Subcutaneous Q24H  . finasteride  5 mg Oral Daily  . gabapentin  900 mg Oral TID  . latanoprost  1 drop Both Eyes QHS  . memantine  10 mg Oral BID  . pantoprazole  40 mg Oral Daily  . pravastatin  10 mg Oral QPM  . traZODone  100 mg Oral QHS  . venlafaxine XR  150 mg Oral BID  . vitamin B-12  1,000 mcg Oral Daily   Continuous Infusions: . lactated ringers 100 mL/hr at 03/16/18 0329  . piperacillin-tazobactam (ZOSYN)  IV 3.375 g (03/16/18 0981)    Procedures/Studies: Ct Angio Chest Pe W And/or Wo Contrast  Result Date: 03/15/2018 CLINICAL DATA:  72 year old male with acute generalized abdominal pain and fever. EXAM: CT ANGIOGRAPHY CHEST CT ABDOMEN AND PELVIS WITH CONTRAST TECHNIQUE: Multidetector CT imaging of the chest was performed using the standard protocol during bolus administration of intravenous contrast. Multiplanar CT image reconstructions and MIPs were obtained to evaluate the vascular anatomy. Multidetector CT imaging of the abdomen and pelvis was performed using the standard protocol during bolus administration of intravenous contrast. CONTRAST:  41mL ISOVUE-370 IOPAMIDOL (ISOVUE-370) INJECTION 76% COMPARISON:  Abdominal CT dated 03/15/2018 FINDINGS: CTA CHEST FINDINGS Cardiovascular: Top-normal cardiac size. No pericardial effusion. Mild atherosclerotic calcification of the thoracic aorta. No aneurysmal dilatation or evidence of dissection. The origins of the great vessels of the aortic arch are patent. Evaluation of the pulmonary arteries is limited due to suboptimal opacification and respiratory motion artifact. No large or central pulmonary artery embolus identified. Mediastinum/Nodes: There is no hilar or mediastinal adenopathy. Esophagus and the thyroid gland are grossly unremarkable. No mediastinal fluid collection. Lungs/Pleura: Bibasilar linear and streaky densities, likely  atelectasis/scarring. There is a 4 mm left lower lobe nodule (series 6, image 87). There is no focal consolidation, pleural effusion, or pneumothorax. The central airways are patent. Musculoskeletal: There is degenerative changes of the spine. No acute fracture. Stimulator device extending into the upper thoracic spine. Review of the MIP images confirms the above findings. CT ABDOMEN and PELVIS FINDINGS No intra-abdominal free air or free fluid. Hepatobiliary: Apparent mild fatty infiltration of the liver. No intrahepatic biliary ductal dilatation. The gallbladder is unremarkable. Pancreas: Unremarkable. No pancreatic ductal dilatation or surrounding inflammatory changes. Spleen: Normal in size without focal abnormality. Adrenals/Urinary Tract: The adrenal glands are unremarkable. Small nonobstructing bilateral renal calculi measure up to 5 mm in the upper pole of the left kidney. There is no hydronephrosis on either side. A 1 cm hypodense lesion  from the superior pole of the right kidney demonstrates fluid attenuation most consistent with a cyst. The visualized ureters and urinary bladder appear unremarkable. Stomach/Bowel: Small hiatal hernia. There is no bowel obstruction or active inflammation. Air is noted throughout the colon. The appendix is normal. Vascular/Lymphatic: Mild aortoiliac atherosclerotic disease. No portal venous gas. There is no adenopathy. Reproductive: The prostate and seminal vesicles are grossly unremarkable. Other: Small fat containing bilateral inguinal and umbilical hernias. No fluid collection or inflammatory changes. Musculoskeletal: There is osteopenia with multilevel degenerative changes. L1-L3 posterior fusion hardware and L4-L5 and L5-S1 interbody disc spacer. No acute osseous pathology. Review of the MIP images confirms the above findings. IMPRESSION: 1. No acute intrathoracic, abdominal, or pelvic pathology. 2. Mild fatty liver. 3. Small nonobstructing bilateral renal calculi.  No  hydronephrosis. 4. Mild air distention of the colon. No bowel obstruction or active inflammation. Normal appendix. 5.  Aortic Atherosclerosis (ICD10-I70.0). 6. A 4 mm left lower lobe pulmonary nodule. No follow-up needed if patient is low-risk. Non-contrast chest CT can be considered in 12 months if patient is high-risk. This recommendation follows the consensus statement: Guidelines for Management of Incidental Pulmonary Nodules Detected on CT Images: From the Fleischner Society 2017; Radiology 2017; [phone]. 7. Electronically Signed   By: Anner Crete M.D.   On: 03/15/2018 22:25   Ct Abdomen Pelvis W Contrast  Result Date: 03/15/2018 CLINICAL DATA:  72 year old male with acute generalized abdominal pain and fever. EXAM: CT ANGIOGRAPHY CHEST CT ABDOMEN AND PELVIS WITH CONTRAST TECHNIQUE: Multidetector CT imaging of the chest was performed using the standard protocol during bolus administration of intravenous contrast. Multiplanar CT image reconstructions and MIPs were obtained to evaluate the vascular anatomy. Multidetector CT imaging of the abdomen and pelvis was performed using the standard protocol during bolus administration of intravenous contrast. CONTRAST:  29mL ISOVUE-370 IOPAMIDOL (ISOVUE-370) INJECTION 76% COMPARISON:  Abdominal CT dated 03/15/2018 FINDINGS: CTA CHEST FINDINGS Cardiovascular: Top-normal cardiac size. No pericardial effusion. Mild atherosclerotic calcification of the thoracic aorta. No aneurysmal dilatation or evidence of dissection. The origins of the great vessels of the aortic arch are patent. Evaluation of the pulmonary arteries is limited due to suboptimal opacification and respiratory motion artifact. No large or central pulmonary artery embolus identified. Mediastinum/Nodes: There is no hilar or mediastinal adenopathy. Esophagus and the thyroid gland are grossly unremarkable. No mediastinal fluid collection. Lungs/Pleura: Bibasilar linear and streaky densities, likely  atelectasis/scarring. There is a 4 mm left lower lobe nodule (series 6, image 87). There is no focal consolidation, pleural effusion, or pneumothorax. The central airways are patent. Musculoskeletal: There is degenerative changes of the spine. No acute fracture. Stimulator device extending into the upper thoracic spine. Review of the MIP images confirms the above findings. CT ABDOMEN and PELVIS FINDINGS No intra-abdominal free air or free fluid. Hepatobiliary: Apparent mild fatty infiltration of the liver. No intrahepatic biliary ductal dilatation. The gallbladder is unremarkable. Pancreas: Unremarkable. No pancreatic ductal dilatation or surrounding inflammatory changes. Spleen: Normal in size without focal abnormality. Adrenals/Urinary Tract: The adrenal glands are unremarkable. Small nonobstructing bilateral renal calculi measure up to 5 mm in the upper pole of the left kidney. There is no hydronephrosis on either side. A 1 cm hypodense lesion from the superior pole of the right kidney demonstrates fluid attenuation most consistent with a cyst. The visualized ureters and urinary bladder appear unremarkable. Stomach/Bowel: Small hiatal hernia. There is no bowel obstruction or active inflammation. Air is noted throughout the colon. The appendix is normal. Vascular/Lymphatic:  Mild aortoiliac atherosclerotic disease. No portal venous gas. There is no adenopathy. Reproductive: The prostate and seminal vesicles are grossly unremarkable. Other: Small fat containing bilateral inguinal and umbilical hernias. No fluid collection or inflammatory changes. Musculoskeletal: There is osteopenia with multilevel degenerative changes. L1-L3 posterior fusion hardware and L4-L5 and L5-S1 interbody disc spacer. No acute osseous pathology. Review of the MIP images confirms the above findings. IMPRESSION: 1. No acute intrathoracic, abdominal, or pelvic pathology. 2. Mild fatty liver. 3. Small nonobstructing bilateral renal calculi.  No  hydronephrosis. 4. Mild air distention of the colon. No bowel obstruction or active inflammation. Normal appendix. 5.  Aortic Atherosclerosis (ICD10-I70.0). 6. A 4 mm left lower lobe pulmonary nodule. No follow-up needed if patient is low-risk. Non-contrast chest CT can be considered in 12 months if patient is high-risk. This recommendation follows the consensus statement: Guidelines for Management of Incidental Pulmonary Nodules Detected on CT Images: From the Fleischner Society 2017; Radiology 2017; [phone]. 7. Electronically Signed   By: Anner Crete M.D.   On: 03/15/2018 22:25   Dg Chest Portable 1 View  Result Date: 03/15/2018 CLINICAL DATA:  72 year old male status post fall getting into car today. Confusion beginning about 1630 hours. Shortness of breath. EXAM: PORTABLE CHEST 1 VIEW COMPARISON:  Portable chest 05/04/2016 and earlier. FINDINGS: Portable AP upright view at 1854 hours. Chronic cardiomegaly. Stable mediastinal contours. Visualized tracheal air column is within normal limits. Mild respiratory motion artifact. Suggestion of increased pulmonary vascularity compared to 2017. No pneumothorax, pleural effusion or consolidation is evident. Prior cervical spine surgery with spinal stimulator device coursing to the cervical region. No acute osseous abnormality identified. Negative visible bowel gas pattern. IMPRESSION: Cardiomegaly with appearance of increased pulmonary vascularity from prior studies. Consider acute pulmonary edema. Electronically Signed   By: Genevie Ann M.D.   On: 03/15/2018 19:09    Orson Eva, DO  [facility] Pager [phone]  If 7PM-7AM, please contact night-coverage [email or web address] Password [facility] 03/16/2018, 7:19 AM   LOS: 0 days

## 2018-03-16 NOTE — Plan of Care (Signed)
  Problem: Acute Rehab PT Goals(only PT should resolve) Goal: Pt Will Go Supine/Side To Sit Outcome: Progressing Flowsheets (Taken 03/16/2018 1038) Pt will go Supine/Side to Sit: with supervision Goal: Patient Will Transfer Sit To/From Stand Outcome: Progressing Flowsheets (Taken 03/16/2018 1038) Patient will transfer sit to/from stand: with supervision Goal: Pt Will Transfer Bed To Chair/Chair To Bed Outcome: Progressing Flowsheets (Taken 03/16/2018 1038) Pt will Transfer Bed to Chair/Chair to Bed: with supervision Goal: Pt Will Ambulate Outcome: Progressing Flowsheets (Taken 03/16/2018 1038) Pt will Ambulate: with supervision;50 feet;with rolling walker  10:39 AM, 03/16/18 Lonell Grandchild, MPT Physical Therapist with [facility] 336 [phone] office [phone] mobile phone

## 2018-03-16 NOTE — ED Notes (Signed)
Dr Shah at bedside,  

## 2018-03-16 NOTE — Progress Notes (Signed)
ANTIBIOTIC CONSULT NOTE-Preliminary  Pharmacy Consult for vancomycin & zosyn Indication: sepsis  No Known Allergies  Patient Measurements: Height: 6\' 3"  (190.5 cm) Weight: 300 lb (136.1 kg) IBW/kg (Calculated) : 84.5 Adjusted Body Weight:   Vital Signs: Temp: 98 F (36.7 C) (03/27 2212) Temp Source: Axillary (03/27 2212) BP: 168/97 (03/28 0030) Pulse Rate: 94 (03/28 0030)  Labs: Recent Labs    03/15/18 1859  WBC 25.7*  HGB 11.4*  PLT 435*  CREATININE 2.09*    Estimated Creatinine Clearance: 48.2 mL/min (A) (by C-G formula based on SCr of 2.09 mg/dL (H)).  No results for input(s): VANCOTROUGH, VANCOPEAK, VANCORANDOM, GENTTROUGH, GENTPEAK, GENTRANDOM, TOBRATROUGH, TOBRAPEAK, TOBRARND, AMIKACINPEAK, AMIKACINTROU, AMIKACIN in the last 72 hours.   Microbiology: Recent Results (from the past 720 hour(s))  Culture, blood (routine x 2)     Status: None (Preliminary result)   Collection Time: 03/15/18  8:00 PM  Result Value Ref Range Status   Specimen Description BLOOD RIGHT WRIST DRAWN BY RN  Final   Special Requests   Final    BOTTLES DRAWN AEROBIC AND ANAEROBIC Blood Culture adequate volume Performed at [facility], [address], [place], [place] [address]    Culture PENDING  Incomplete   Report Status PENDING  Incomplete  Culture, blood (routine x 2)     Status: None (Preliminary result)   Collection Time: 03/15/18  8:21 PM  Result Value Ref Range Status   Specimen Description BLOOD LEFT HAND  Final   Special Requests   Final    BOTTLES DRAWN AEROBIC AND ANAEROBIC Blood Culture adequate volume Performed at [facility], [address], [place], [place] [address]    Culture PENDING  Incomplete   Report Status PENDING  Incomplete    Medical History: Past Medical History:  Diagnosis Date  . Allergic rhinitis   . Arthritis   . Asthma    as a child  . B12 deficiency   . Cancer [facility])    prostate  . Cervicogenic headache 01/28/2016  . Chronic back pain   .  Coronary atherosclerosis of native coronary artery    Mild nonobstructive CAD at catheterization January 2015  . Depression   . Essential hypertension, benign   . Falls   . GERD (gastroesophageal reflux disease)   . History of cerebrovascular disease 07/23/2015  . History of pneumonia 02/2011  . Hyperlipidemia   . Kidney stone   . Memory difficulty 07/23/2015  . OSA (obstructive sleep apnea)    CPAP - Dr. [name]  . Prostate cancer ([place])   . PTSD (post-traumatic stress disorder)    [place]  . Rectal bleeding     Medications:  Scheduled:  . amLODipine  10 mg Oral Daily  . aspirin EC  81 mg Oral Daily  . cycloSPORINE  1 drop Both Eyes BID  . docusate sodium  200 mg Oral BID  . donepezil  10 mg Oral QHS  . enoxaparin (LOVENOX) injection  40 mg Subcutaneous Q24H  . finasteride  5 mg Oral Daily  . gabapentin  900 mg Oral TID  . latanoprost  1 drop Both Eyes QHS  . memantine  10 mg Oral BID  . pantoprazole  40 mg Oral Daily  . pravastatin  10 mg Oral QPM  . traZODone  100 mg Oral QHS  . venlafaxine XR  150 mg Oral BID  . vitamin B-12  1,000 mcg Oral Daily   Infusions:  . lactated ringers    . piperacillin-tazobactam (ZOSYN)  IV    . vancomycin     PRN: acetaminophen **OR** acetaminophen, fluticasone, hydrALAZINE, ondansetron **OR** ondansetron (ZOFRAN) IV, oxyCODONE-acetaminophen Anti-infectives (From admission, onward)   Start     Dose/Rate Route Frequency Ordered Stop   03/16/18 0500  piperacillin-tazobactam (ZOSYN) IVPB 3.375 g     3.375 g 12.5 mL/hr over 240 Minutes Intravenous Every 8 hours 03/16/18 0127     03/16/18 0130  vancomycin (VANCOCIN) IVPB 1000 mg/200 mL premix     1,000 mg 200 mL/hr over 60 Minutes Intravenous Every 1 hr x 2 03/16/18 0126 03/16/18 0329   03/15/18 2030  piperacillin-tazobactam (ZOSYN) IVPB 3.375 g     3.375 g 100 mL/hr over 30 Minutes Intravenous  Once 03/15/18 2017 03/15/18 2118      Assessment: 72 yo male with PMH chronic back pain,  nephrolithiasis, HTN, HLD, morbid obesity, and CAD came to ED after a fall at home.  Saw urology earlier today and was prescribed doxycycline.  Febrile and tachycardic in ED.  Starting vancomycin and zosyn for presumed sepsis.    Goal of Therapy:  Vancomycin trough level 15-20 mcg/ml  Plan:  Preliminary review of pertinent patient information completed.  Protocol will be initiated with dose(s) of vancomycin 2gram loading dose and zosyn 3.375 grams IV Q8 hours.   Forestine Na clinical pharmacist will complete review during morning rounds to assess patient and finalize treatment regimen if needed.  Thorn Demas Scarlett, RPH 03/16/2018,1:28 AM

## 2018-03-16 NOTE — ED Notes (Signed)
Report given to [facility] on 2A

## 2018-03-17 ENCOUNTER — Inpatient Hospital Stay (HOSPITAL_COMMUNITY): Payer: Medicare Other

## 2018-03-17 ENCOUNTER — Inpatient Hospital Stay (HOSPITAL_COMMUNITY)
Admit: 2018-03-17 | Discharge: 2018-03-17 | Disposition: A | Discharge status: Home / Self Care | Payer: Medicare Other | Attending: Internal Medicine | Admitting: Internal Medicine

## 2018-03-17 DIAGNOSIS — G253 Myoclonus: Secondary | ICD-10-CM

## 2018-03-17 LAB — CBC
HCT: 34.7 % — ABNORMAL LOW (ref 39.0–52.0)
Hemoglobin: 11 g/dL — ABNORMAL LOW (ref 13.0–17.0)
MCH: 26.8 pg (ref 26.0–34.0)
MCHC: 31.7 g/dL (ref 30.0–36.0)
MCV: 84.4 fL (ref 78.0–100.0)
Platelets: 410 10*3/uL — ABNORMAL HIGH (ref 150–400)
RBC: 4.11 MIL/uL — ABNORMAL LOW (ref 4.22–5.81)
RDW: 16.1 % — ABNORMAL HIGH (ref 11.5–15.5)
WBC: 18.2 10*3/uL — ABNORMAL HIGH (ref 4.0–10.5)

## 2018-03-17 LAB — PROCALCITONIN: Procalcitonin: 1.28 ng/mL

## 2018-03-17 LAB — URINE CULTURE: Culture: <10000 — AB

## 2018-03-17 LAB — COMPREHENSIVE METABOLIC PANEL
ALT: 41 U/L (ref 17–63)
AST: 47 U/L — ABNORMAL HIGH (ref 15–41)
Albumin: 2.3 g/dL — ABNORMAL LOW (ref 3.5–5.0)
Alkaline Phosphatase: 109 U/L (ref 38–126)
Anion gap: 11 (ref 5–15)
BUN: 20 mg/dL (ref 6–20)
CO2: 25 mmol/L (ref 22–32)
Calcium: 8.4 mg/dL — ABNORMAL LOW (ref 8.9–10.3)
Chloride: 99 mmol/L — ABNORMAL LOW (ref 101–111)
Creatinine, Ser: 1.02 mg/dL (ref 0.61–1.24)
GFR calc Af Amer: >60 mL/min (ref >60)
GFR calc non Af Amer: >60 mL/min (ref >60)
Glucose, Bld: 108 mg/dL — ABNORMAL HIGH (ref 65–99)
Potassium: 3.8 mmol/L (ref 3.5–5.1)
Sodium: 135 mmol/L (ref 135–145)
Total Bilirubin: 0.9 mg/dL (ref 0.3–1.2)
Total Protein: 6.7 g/dL (ref 6.5–8.1)

## 2018-03-17 LAB — BRAIN NATRIURETIC PEPTIDE: B Natriuretic Peptide: 61 pg/mL (ref 0.0–100.0)

## 2018-03-17 LAB — CK: Total CK: 113 U/L (ref 49–397)

## 2018-03-17 LAB — MAGNESIUM: Magnesium: 2.2 mg/dL (ref 1.7–2.4)

## 2018-03-17 MED ORDER — HYDRALAZINE HCL 20 MG/ML IJ SOLN
10.0000 mg | Freq: Four times a day (QID) | INTRAMUSCULAR | Status: DC | PRN
  Administered 2018-03-17: 10 mg via INTRAVENOUS
  Filled 2018-03-17: qty 1

## 2018-03-17 MED ORDER — IPRATROPIUM-ALBUTEROL 0.5-2.5 (3) MG/3ML IN SOLN
3.0000 mL | Freq: Four times a day (QID) | RESPIRATORY_TRACT | Status: DC
  Administered 2018-03-17 – 2018-03-18 (×6): 3 mL via RESPIRATORY_TRACT
  Filled 2018-03-17 (×7): qty 3

## 2018-03-17 NOTE — Progress Notes (Signed)
Patient's girlfriend [name] returned with his card for spinal stimulator as requested by MRI. Card info provided to MRI tech by Toy Baker, RN. Donavan Foil, RN

## 2018-03-17 NOTE — Progress Notes (Signed)
Patient to have MRI completed at [facility] due to being unable to scan here due to weight per MRI tech. Notified MRI at [facility] and provided patient's weight and info regarding his spinal cord stimulator as requested by Dr. Carles Collet. MRI tech stated she would review the information and make sure he could be scanned and call back. Notified MD. Copy of spinal cord stimulator card placed on chart as well for future reference. Donavan Foil, RN  Spinal Cord Stimulator card info for reference:  [facility] Spinal Cord Stimulation System Model: [id number] Serial/Lot: [id number] Leads: [id number] Serial/Lot: [id number] Implant Date: 01/18/2018 Physician Listed on Card: Vivi Ferns

## 2018-03-17 NOTE — Procedures (Signed)
ELECTROENCEPHALOGRAM REPORT  Date of Study: 03/17/2018  Patient's Name: Cody Velez MRN: [id number] Date of Birth: [birth year]  Referring Provider: Orson Eva, DO  Clinical History: 72 year old male with sepsis presenting with confusion and myoclonus  Medications: Gabapentin Memantine Venlafaxine  Hydralazine Percocet Trazodone Zosyn Vancomycin Amlodipine Pravastatin ASA Lovenox Proscar B12  Technical Summary: A multichannel digital EEG recording measured by the international 10-20 system with electrodes applied with paste and impedances below 5000 ohms performed in our laboratory with EKG monitoring in an awake but drowsy and asleep patient.  Hyperventilation and photic stimulation were not performed.  The digital EEG was referentially recorded, reformatted, and digitally filtered in a variety of bipolar and referential montages for optimal display.    Description: The patient is awake but drowsy and asleep during the recording.  During maximal wakefulness, there is a symmetric, medium voltage 9-10 Hz posterior dominant rhythm that attenuates with eye opening.  The record is symmetric.  During drowsiness and sleep, there is an increase in theta slowing of the background.  Vertex waves and symmetric sleep spindles were seen.  Patient did exhibit brief jerking of the head and body with no electrographic correlate.  There were no epileptiform discharges or electrographic seizures seen.    EKG lead was unremarkable.  Impression: This awake and asleep EEG is normal.    Clinical Correlation: A normal EEG does not exclude a clinical diagnosis of epilepsy.  If further clinical questions remain, prolonged EEG may be helpful.  Clinical correlation is advised.   Metta Clines, DO

## 2018-03-17 NOTE — Progress Notes (Signed)
MRI tech requested patient have someone bring in his card for the pain stimulator prior to having MRI completed. Patient states "my girlfriend has it". Attempted to call her to bring it in but unable to reach her. Patient's daughter [name] states she has asked her to bring it in and it should be on the way. Will notify MRI. Donavan Foil, RN

## 2018-03-17 NOTE — Progress Notes (Signed)
Patient's girlfriend [name] asked nursing to speak with his support tech for the spinal cord stimulator since she had questions regarding MD contact information. Spoke with Wellington Hampshire, support tech at patient's request. Stated he should be able to have MRI completed but she would need to speak with MD. Notified Dr. Carles Collet who spoke with her. Received call back from Archer, MRI at [facility]. States she discussed case with Dr.Grady and support for his device and patient is not eligible for MRI. Notified Dr. Carles Collet. Donavan Foil, RN

## 2018-03-17 NOTE — Progress Notes (Signed)
Patient c/o shortness of breath at rest, breathing labored but symmetrical, expiratory wheezing noted. O2 sats 94% on 2 lpm. Denies chest pain or discomfort. No cough. Text paged Dr. Carles Collet to notify. MD placed orders for neb treatments and CXR. Notified RT of need for neb treatment. Donavan Foil, RN

## 2018-03-17 NOTE — Progress Notes (Signed)
Had multiple discussions with radiology, patient's pain management specialist, and spinal stimulator representative today.  Overall clinical suspicion for battery site and spinal stimulator infection/epidural abscess is low as the patient has no neck pain, back pain.  He has some mild pain near his left flank where the battery site is, but the patient also had a mechanical fall near this area.  There is no erythema, edema, drainage, or open wounds on his back to the surgical sites. Personally reviewed chest x-ray 03/08/2018--suggest HCAP -plan to tx as PNA -saline lock fluids -lasix IV x 1 -continue bronchodilators -check MRSA PCR -continue vanc and zosyn for now -discussed with neuroRadiology, Dr. Jeralyn Ruths at [facility] perform MRI at [facility] based upon parameters set by pt's spinal stimulator.  Will defer further pursuit at this point as clincal suspicion is low for infx.  Fever is trending down and WBC trending down.  DTat  Total time 60 additional minutes

## 2018-03-17 NOTE — Progress Notes (Signed)
Patient c/o feeling hot, clammy. Temperature 101.6 orally, tylenol given as ordered. HR noted to have increased from 80s-90s to the 100s-110s range. Pt states pain remains but better than earlier today. Denies shortness of breath, O2 sats 94-95% on 2 lpm. remains alert and oriented x 4, but states feels more tired.Text-paged mid-level provider to notify. Night shift nurse will monitor. Donavan Foil, RN

## 2018-03-17 NOTE — Progress Notes (Signed)
PROGRESS NOTE  Cody Velez UJW:[id number] DOB: [birth year] DOA: 03/15/2018 PCP: Asencion Noble, MD  Brief History:  72 year old male with a history of hypertension, hyperlipidemia, morbid obesity, coronary artery disease, chronic back pain status post spinal stimulator (C-spine) placed 01/18/18 presented with worsening confusion and a mechanical fall at home.  The patient significant other contributes to the history at this time as he is not able to provide good history secondary to his encephalopathy.  Apparently, the patient has had subjective fevers and chills for the better part of one month.  In the past week, he has had worsening confusion.  On 03/11/2018, the patient was returning from a trip from [facility].  Apparently he fell in the bathroom at [facility] and was not able to get up.  He was admitted to [facility] on 03/11/2018 and discharged on 03/13/2018.  He was at his baseline at the time of discharge.  However, he has had increasing confusion on 03/14/2018 and 03/15/2018.  His significant other feels that the patient had been overusing his Percocet at home which may have been contributing to his falls and confusion.  However, she has taken away his Percocets since his discharge from [facility], he has not had any in the 2 days prior to this admission.  However, the patient has been increasing his use of ibuprofen for his pain.  The patient denies any worsening headache, neck pain, nausea, vomiting, diarrhea, abdominal pain, dysuria, hematuria.  He complains of left and right flank pain, left greater than right.  In the emergency department, the patient had fever 1 2.4 F with tachycardia.  He remained hemodynamically stable with oxygen saturation of 92% on room air.  CT angiogram of the chest was negative for pulmonary embolus or dissection.  There was no consolidation or pleural effusion.  CT abdomen and pelvis was unremarkable for acute findings.  There was nonobstructive bilateral renal  calculi.  The patient was started on Zosyn, IV fluids, and admitted for further workup. The patient followed up with urology on the morning of 03/15/2018.  There was concern for UTI.  The patient was prescribed doxycycline of which she took 1 dose prior to going to the emergency department.  Assessment/Plan: Sepsis -Source likely PNA, less likely spinal cord stimulator/battery pack -Low suspicion of surgical site infection from his spinal stimulator and battery placement -Continue Zosyn/Vanc pending culture data -Lactic acid peaked 1.8 -Check procalcitonin--2.15>>>1.28 -Continue IV fluids -UA 6-30 WBC -3/29 repeat Chest x-ray--RLL opacity; increased interstitial markings -Viral respiratory panel--neg -spoke with pt's pain management specialist-->stimulator is MRI compatible using 1.5T magnet -discussed with radiology, Dr. Hurshel Party try to attempt MRI here at [facility]  Acute kidney injury -Baseline creatinine 1.0-1.2 -Presenting creatinine 2.09 -Secondary to sepsis and volume depletion in setting of NSAID use -Improving with IV fluids  Acute respiratory failure with hypoxia -due to PNA -03/17/18--personally reviewed CXR--increase interstitial markings, RLL opacity -stable on 2L -saline lock IVF  Essential hypertension -Holding losartan in the setting of AKI -Continue amlodipine  Acute metabolic encephalopathy -Secondary to sepsis -Remains confused but improving -Significant other states that he has not used his Percocet in the past 48 hours prior to admission  Myoclonus -Likely secondary to the patient's infectious process and gabapentin setting of renal failure -EEG -Reduced gabapentin dose  Cognitive impairment -Continue Aricept and Namenda  Chronic pain syndrome -Minimize hypnotic and opioids -Continue gabapentin--decrease dose for renal failure  Depression -Continue home dose of venlafaxine -  Holding trazodone in the setting of  encephalopathy  Hyperlipidemia -Continue statin  Coronary artery disease -No chest pain presently -Personally reviewed EKG--sinus rhythm, no ST-T wave changes -Personally reviewed chest x-ray-increased interstitial markings without consolidation  Hypokalemia -replete -check mag  Transaminasemia -due to sepsis -no RUQ pain -am CMP   Disposition Plan:   Home in 2-3 days  Family Communication:   Significant other and daughter updated at bedside--total time 45 min; >50% spent counseling and coordinating care  Consultants:  none  Code Status:  FULL  DVT Prophylaxis:  [place] Lovenox   Procedures: As Listed in Progress Note Above  Antibiotics: Zosyn 3/27>>>       Subjective: Patient continues to complain of left greater than right sided flank pain.  He denies any headache, neck pain, chest pain, shortness breath, nausea, vomiting, diarrhea, abdominal pain.  He does have some dyspnea with exertion.  He has a nonproductive cough.  Objective: Vitals:   03/17/18 0812 03/17/18 0915 03/17/18 0945 03/17/18 1016  BP:      Pulse:      Resp:      Temp:    99.6 F (37.6 C)  TempSrc:    Axillary  SpO2:  94% 92%   Weight: (!) 138.8 kg (306 lb)     Height:        Intake/Output Summary (Last 24 hours) at 03/17/2018 1106 Last data filed at 03/17/2018 0900 Gross per 24 hour  Intake 536.67 ml  Output 1050 ml  Net -513.33 ml   Weight change:  Exam:   General:  Pt is alert, follows commands appropriately, not in acute distress  HEENT: No icterus, No thrush, No neck mass, [place]/AT  Cardiovascular: RRR, S1/S2, no rubs, no gallops  Respiratory: Bibasilar rales, right greater than left.  No wheezing.  Abdomen: Soft/+BS, non tender, non distended, no guarding  Extremities: No edema, No lymphangitis, No petechiae, No rashes, no synovitis   Data Reviewed: I have personally reviewed following labs and imaging studies Basic Metabolic Panel: Recent Labs  Lab  03/15/18 1859 03/16/18 0429 03/16/18 0826 03/17/18 0609  NA 137 137  --  135  K 3.7 3.3*  --  3.8  CL 101 101  --  99*  CO2 21* 23  --  25  GLUCOSE 158* 131*  --  108*  BUN 27* 25*  --  20  CREATININE 2.09* 1.53*  --  1.02  CALCIUM 9.2 8.6*  --  8.4*  MG  --   --  2.0 2.2   Liver Function Tests: Recent Labs  Lab 03/15/18 1859 03/17/18 0609  AST 50* 47*  ALT 55 41  ALKPHOS 118 109  BILITOT 1.4* 0.9  PROT 7.7 6.7  ALBUMIN 2.7* 2.3*   No results for input(s): LIPASE, AMYLASE in the last 168 hours. No results for input(s): AMMONIA in the last 168 hours. Coagulation Profile: No results for input(s): INR, PROTIME in the last 168 hours. CBC: Recent Labs  Lab 03/15/18 1859 03/16/18 0429 03/17/18 0609  WBC 25.7* 22.9* 18.2*  NEUTROABS 24.2*  --   --   HGB 11.4* 10.7* 11.0*  HCT 35.3* 33.7* 34.7*  MCV 83.6 83.4 84.4  PLT 435* 428* 410*   Cardiac Enzymes: Recent Labs  Lab 03/15/18 1859 03/16/18 0826 03/17/18 0609  CKTOTAL  --  438* 113  TROPONINI <0.03  --   --    BNP: Invalid input(s): POCBNP CBG: No results for input(s): GLUCAP in the last 168 hours. HbA1C: No  results for input(s): HGBA1C in the last 72 hours. Urine analysis:    Component Value Date/Time   COLORURINE AMBER (A) 03/15/2018 1830   APPEARANCEUR CLOUDY (A) 03/15/2018 1830   LABSPEC 1.029 03/15/2018 1830   PHURINE 5.0 03/15/2018 1830   GLUCOSEU 50 (A) 03/15/2018 1830   HGBUR MODERATE (A) 03/15/2018 1830   BILIRUBINUR MODERATE (A) 03/15/2018 1830   KETONESUR NEGATIVE 03/15/2018 1830   PROTEINUR 100 (A) 03/15/2018 1830   UROBILINOGEN 0.2 03/05/2011 1909   NITRITE NEGATIVE 03/15/2018 1830   LEUKOCYTESUR NEGATIVE 03/15/2018 1830   Sepsis Labs: @LABRCNTIP (procalcitonin:4,lacticidven:4) ) Recent Results (from the past 240 hour(s))  Urine culture     Status: Abnormal   Collection Time: 03/15/18  6:30 PM  Result Value Ref Range Status   Specimen Description   Final    URINE,  RANDOM Performed at [facility], [address] [place], [place] [address]    Special Requests   Final    NONE Performed at [facility], [address] [place], [place] [address]    Culture (A)  Final    <10,000 COLONIES/mL INSIGNIFICANT GROWTH Performed at [facility], [place] [address], [place], [place] [address]    Report Status 03/17/2018 FINAL  Final  Culture, blood (routine x 2)     Status: None (Preliminary result)   Collection Time: 03/15/18  8:00 PM  Result Value Ref Range Status   Specimen Description BLOOD RIGHT WRIST DRAWN BY RN  Final   Special Requests   Final    BOTTLES DRAWN AEROBIC AND ANAEROBIC Blood Culture adequate volume   Culture   Final    NO GROWTH 2 DAYS Performed at [facility], [address], [place], [place] [address]    Report Status PENDING  Incomplete  Culture, blood (routine x 2)     Status: None (Preliminary result)   Collection Time: 03/15/18  8:21 PM  Result Value Ref Range Status   Specimen Description BLOOD LEFT HAND  Final   Special Requests   Final    BOTTLES DRAWN AEROBIC AND ANAEROBIC Blood Culture adequate volume   Culture   Final    NO GROWTH 2 DAYS Performed at [facility], [address], [place], [place] [address]    Report Status PENDING  Incomplete  Respiratory Panel by PCR     Status: None   Collection Time: 03/16/18 11:40 AM  Result Value Ref Range Status   Adenovirus NOT DETECTED NOT DETECTED Final   Coronavirus 229E NOT DETECTED NOT DETECTED Final   Coronavirus HKU1 NOT DETECTED NOT DETECTED Final   Coronavirus NL63 NOT DETECTED NOT DETECTED Final   Coronavirus OC43 NOT DETECTED NOT DETECTED Final   Metapneumovirus NOT DETECTED NOT DETECTED Final   Rhinovirus / Enterovirus NOT DETECTED NOT DETECTED Final   Influenza A NOT DETECTED NOT DETECTED Final   Influenza B NOT DETECTED NOT DETECTED Final   Parainfluenza Virus 1 NOT DETECTED NOT DETECTED Final   Parainfluenza Virus 2 NOT DETECTED NOT DETECTED Final    Parainfluenza Virus 3 NOT DETECTED NOT DETECTED Final   Parainfluenza Virus 4 NOT DETECTED NOT DETECTED Final   Respiratory Syncytial Virus NOT DETECTED NOT DETECTED Final   Bordetella pertussis NOT DETECTED NOT DETECTED Final   Chlamydophila pneumoniae NOT DETECTED NOT DETECTED Final   Mycoplasma pneumoniae NOT DETECTED NOT DETECTED Final    Comment: Performed at [facility], [place] [address] [place], [place] [address]     Scheduled Meds: . amLODipine  10 mg Oral Daily  . aspirin EC  81 mg Oral Daily  . cycloSPORINE  1 drop Both Eyes BID  . docusate sodium  200 mg Oral BID  . donepezil  10 mg Oral QHS  . enoxaparin (LOVENOX) injection  40 mg Subcutaneous Q24H  . finasteride  5 mg Oral Daily  . gabapentin  600 mg Oral TID  . ipratropium-albuterol  3 mL Nebulization Q6H  . latanoprost  1 drop Both Eyes QHS  . memantine  10 mg Oral BID  . pantoprazole  40 mg Oral Daily  . pravastatin  10 mg Oral QPM  . traZODone  100 mg Oral QHS  . venlafaxine XR  150 mg Oral BID  . vitamin B-12  1,000 mcg Oral Daily   Continuous Infusions: . piperacillin-tazobactam (ZOSYN)  IV Stopped (03/17/18 1008)  . vancomycin Stopped (03/17/18 0150)    Procedures/Studies: Ct Angio Chest Pe W And/or Wo Contrast  Result Date: 03/15/2018 CLINICAL DATA:  72 year old male with acute generalized abdominal pain and fever. EXAM: CT ANGIOGRAPHY CHEST CT ABDOMEN AND PELVIS WITH CONTRAST TECHNIQUE: Multidetector CT imaging of the chest was performed using the standard protocol during bolus administration of intravenous contrast. Multiplanar CT image reconstructions and MIPs were obtained to evaluate the vascular anatomy. Multidetector CT imaging of the abdomen and pelvis was performed using the standard protocol during bolus administration of intravenous contrast. CONTRAST:  43mL ISOVUE-370 IOPAMIDOL (ISOVUE-370) INJECTION 76% COMPARISON:  Abdominal CT dated 03/15/2018 FINDINGS: CTA CHEST FINDINGS Cardiovascular:  Top-normal cardiac size. No pericardial effusion. Mild atherosclerotic calcification of the thoracic aorta. No aneurysmal dilatation or evidence of dissection. The origins of the great vessels of the aortic arch are patent. Evaluation of the pulmonary arteries is limited due to suboptimal opacification and respiratory motion artifact. No large or central pulmonary artery embolus identified. Mediastinum/Nodes: There is no hilar or mediastinal adenopathy. Esophagus and the thyroid gland are grossly unremarkable. No mediastinal fluid collection. Lungs/Pleura: Bibasilar linear and streaky densities, likely atelectasis/scarring. There is a 4 mm left lower lobe nodule (series 6, image 87). There is no focal consolidation, pleural effusion, or pneumothorax. The central airways are patent. Musculoskeletal: There is degenerative changes of the spine. No acute fracture. Stimulator device extending into the upper thoracic spine. Review of the MIP images confirms the above findings. CT ABDOMEN and PELVIS FINDINGS No intra-abdominal free air or free fluid. Hepatobiliary: Apparent mild fatty infiltration of the liver. No intrahepatic biliary ductal dilatation. The gallbladder is unremarkable. Pancreas: Unremarkable. No pancreatic ductal dilatation or surrounding inflammatory changes. Spleen: Normal in size without focal abnormality. Adrenals/Urinary Tract: The adrenal glands are unremarkable. Small nonobstructing bilateral renal calculi measure up to 5 mm in the upper pole of the left kidney. There is no hydronephrosis on either side. A 1 cm hypodense lesion from the superior pole of the right kidney demonstrates fluid attenuation most consistent with a cyst. The visualized ureters and urinary bladder appear unremarkable. Stomach/Bowel: Small hiatal hernia. There is no bowel obstruction or active inflammation. Air is noted throughout the colon. The appendix is normal. Vascular/Lymphatic: Mild aortoiliac atherosclerotic disease.  No portal venous gas. There is no adenopathy. Reproductive: The prostate and seminal vesicles are grossly unremarkable. Other: Small fat containing bilateral inguinal and umbilical hernias. No fluid collection or inflammatory changes. Musculoskeletal: There is osteopenia with multilevel degenerative changes. L1-L3 posterior fusion hardware and L4-L5 and L5-S1 interbody disc spacer. No acute osseous pathology. Review of the MIP images confirms the above findings. IMPRESSION: 1. No  acute intrathoracic, abdominal, or pelvic pathology. 2. Mild fatty liver. 3. Small nonobstructing bilateral renal calculi.  No hydronephrosis. 4. Mild air distention of the colon. No bowel obstruction or active inflammation. Normal appendix. 5.  Aortic Atherosclerosis (ICD10-I70.0). 6. A 4 mm left lower lobe pulmonary nodule. No follow-up needed if patient is low-risk. Non-contrast chest CT can be considered in 12 months if patient is high-risk. This recommendation follows the consensus statement: Guidelines for Management of Incidental Pulmonary Nodules Detected on CT Images: From the Fleischner Society 2017; Radiology 2017; [id number]. 7. Electronically Signed   By: Anner Crete M.D.   On: 03/15/2018 22:25   Ct Abdomen Pelvis W Contrast  Result Date: 03/15/2018 CLINICAL DATA:  72 year old male with acute generalized abdominal pain and fever. EXAM: CT ANGIOGRAPHY CHEST CT ABDOMEN AND PELVIS WITH CONTRAST TECHNIQUE: Multidetector CT imaging of the chest was performed using the standard protocol during bolus administration of intravenous contrast. Multiplanar CT image reconstructions and MIPs were obtained to evaluate the vascular anatomy. Multidetector CT imaging of the abdomen and pelvis was performed using the standard protocol during bolus administration of intravenous contrast. CONTRAST:  18mL ISOVUE-370 IOPAMIDOL (ISOVUE-370) INJECTION 76% COMPARISON:  Abdominal CT dated 03/15/2018 FINDINGS: CTA CHEST FINDINGS Cardiovascular:  Top-normal cardiac size. No pericardial effusion. Mild atherosclerotic calcification of the thoracic aorta. No aneurysmal dilatation or evidence of dissection. The origins of the great vessels of the aortic arch are patent. Evaluation of the pulmonary arteries is limited due to suboptimal opacification and respiratory motion artifact. No large or central pulmonary artery embolus identified. Mediastinum/Nodes: There is no hilar or mediastinal adenopathy. Esophagus and the thyroid gland are grossly unremarkable. No mediastinal fluid collection. Lungs/Pleura: Bibasilar linear and streaky densities, likely atelectasis/scarring. There is a 4 mm left lower lobe nodule (series 6, image 87). There is no focal consolidation, pleural effusion, or pneumothorax. The central airways are patent. Musculoskeletal: There is degenerative changes of the spine. No acute fracture. Stimulator device extending into the upper thoracic spine. Review of the MIP images confirms the above findings. CT ABDOMEN and PELVIS FINDINGS No intra-abdominal free air or free fluid. Hepatobiliary: Apparent mild fatty infiltration of the liver. No intrahepatic biliary ductal dilatation. The gallbladder is unremarkable. Pancreas: Unremarkable. No pancreatic ductal dilatation or surrounding inflammatory changes. Spleen: Normal in size without focal abnormality. Adrenals/Urinary Tract: The adrenal glands are unremarkable. Small nonobstructing bilateral renal calculi measure up to 5 mm in the upper pole of the left kidney. There is no hydronephrosis on either side. A 1 cm hypodense lesion from the superior pole of the right kidney demonstrates fluid attenuation most consistent with a cyst. The visualized ureters and urinary bladder appear unremarkable. Stomach/Bowel: Small hiatal hernia. There is no bowel obstruction or active inflammation. Air is noted throughout the colon. The appendix is normal. Vascular/Lymphatic: Mild aortoiliac atherosclerotic disease.  No portal venous gas. There is no adenopathy. Reproductive: The prostate and seminal vesicles are grossly unremarkable. Other: Small fat containing bilateral inguinal and umbilical hernias. No fluid collection or inflammatory changes. Musculoskeletal: There is osteopenia with multilevel degenerative changes. L1-L3 posterior fusion hardware and L4-L5 and L5-S1 interbody disc spacer. No acute osseous pathology. Review of the MIP images confirms the above findings. IMPRESSION: 1. No acute intrathoracic, abdominal, or pelvic pathology. 2. Mild fatty liver. 3. Small nonobstructing bilateral renal calculi.  No hydronephrosis. 4. Mild air distention of the colon. No bowel obstruction or active inflammation. Normal appendix. 5.  Aortic Atherosclerosis (ICD10-I70.0). 6. A 4 mm left lower lobe pulmonary  nodule. No follow-up needed if patient is low-risk. Non-contrast chest CT can be considered in 12 months if patient is high-risk. This recommendation follows the consensus statement: Guidelines for Management of Incidental Pulmonary Nodules Detected on CT Images: From the Fleischner Society 2017; Radiology 2017; [phone]. 7. Electronically Signed   By: Anner Crete M.D.   On: 03/15/2018 22:25   Dg Chest Port 1 View  Result Date: 03/17/2018 CLINICAL DATA:  Respiratory distress.  History of prostate carcinoma EXAM: PORTABLE CHEST 1 VIEW COMPARISON:  Chest radiograph and chest CT March 15, 2018 FINDINGS: There is new airspace consolidation in the medial right base, suspicious for pneumonia. The lungs elsewhere are clear. There is cardiomegaly with pulmonary venous hypertension. There is a small right pleural effusion. No adenopathy. There is a stimulator with lead tips at the cervical-thoracic junction. There is postoperative change in the lower cervical spine. IMPRESSION: Medial right base consolidation felt to represent pneumonia. Underlying pulmonary vascular congestion. Small right pleural effusion. Electronically  Signed   By: Lowella Grip III M.D.   On: 03/17/2018 09:48   Dg Chest Portable 1 View  Result Date: 03/15/2018 CLINICAL DATA:  72 year old male status post fall getting into car today. Confusion beginning about 1630 hours. Shortness of breath. EXAM: PORTABLE CHEST 1 VIEW COMPARISON:  Portable chest 05/04/2016 and earlier. FINDINGS: Portable AP upright view at 1854 hours. Chronic cardiomegaly. Stable mediastinal contours. Visualized tracheal air column is within normal limits. Mild respiratory motion artifact. Suggestion of increased pulmonary vascularity compared to 2017. No pneumothorax, pleural effusion or consolidation is evident. Prior cervical spine surgery with spinal stimulator device coursing to the cervical region. No acute osseous abnormality identified. Negative visible bowel gas pattern. IMPRESSION: Cardiomegaly with appearance of increased pulmonary vascularity from prior studies. Consider acute pulmonary edema. Electronically Signed   By: Genevie Ann M.D.   On: 03/15/2018 19:09    Orson Eva, DO  [facility] Pager [phone]  If 7PM-7AM, please contact night-coverage [email or web address] Password [email or web address] 03/17/2018, 11:06 AM   LOS: 1 day

## 2018-03-17 NOTE — Clinical Social Work Note (Signed)
Per MD, pt will remain hospitalized through the weekend. Spoke with [place] Fuss at [facility] who stated that they can likely accept pt. Pt has Medicare primary so no [name] is needed. Will update pt's daughter and follow up Monday.

## 2018-03-17 NOTE — Progress Notes (Signed)
EEG completed, results pending. 

## 2018-03-18 DIAGNOSIS — E669 Obesity, unspecified: Secondary | ICD-10-CM | POA: Diagnosis present

## 2018-03-18 DIAGNOSIS — R7881 Bacteremia: Secondary | ICD-10-CM | POA: Diagnosis present

## 2018-03-18 LAB — BLOOD CULTURE ID PANEL (REFLEXED)

## 2018-03-18 LAB — PROCALCITONIN: Procalcitonin: 0.77 ng/mL

## 2018-03-18 MED ORDER — IPRATROPIUM-ALBUTEROL 0.5-2.5 (3) MG/3ML IN SOLN
3.0000 mL | Freq: Three times a day (TID) | RESPIRATORY_TRACT | Status: DC
  Administered 2018-03-18 – 2018-03-19 (×2): 3 mL via RESPIRATORY_TRACT
  Filled 2018-03-18 (×2): qty 3

## 2018-03-18 MED ORDER — CEFAZOLIN SODIUM-DEXTROSE 2-4 GM/100ML-% IV SOLN
2.0000 g | Freq: Three times a day (TID) | INTRAVENOUS | Status: DC
  Administered 2018-03-18 – 2018-03-30 (×33): 2 g via INTRAVENOUS
  Filled 2018-03-18 (×42): qty 100

## 2018-03-18 MED ORDER — FUROSEMIDE 10 MG/ML IJ SOLN
40.0000 mg | Freq: Once | INTRAMUSCULAR | Status: AC
  Administered 2018-03-18: 40 mg via INTRAVENOUS
  Filled 2018-03-18: qty 4

## 2018-03-18 NOTE — Progress Notes (Signed)
PROGRESS NOTE  Cody Velez XBJ:[id number] DOB: 16-Apr-1946 DOA: 03/15/2018 PCP: Asencion Noble, MD  Brief History:  72 year old male with a history of hypertension, hyperlipidemia, morbid obesity, coronary artery disease, chronic back pain status post spinal stimulator (C-spine) placed 01/18/18 presented with worsening confusion and a mechanical fall at home.  The patient significant other contributes to the history at this time as he is not able to provide good history secondary to his encephalopathy.  Apparently, the patient has had subjective fevers and chills for the better part of one month.  In the past week, he has had worsening confusion.  On 03/11/2018, the patient was returning from a trip from [facility].  Apparently he fell in the bathroom at [facility] and was not able to get up.  He was admitted to [facility] on 03/11/2018 and discharged on 03/13/2018.  He was at his baseline at the time of discharge.  However, he has had increasing confusion on 03/14/2018 and 03/15/2018.  His significant other feels that the patient had been overusing his Percocet at home which may have been contributing to his falls and confusion.  However, she has taken away his Percocets since his discharge from [facility], he has not had any in the 2 days prior to this admission.  However, the patient has been increasing his use of ibuprofen for his pain.  The patient denies any worsening headache, neck pain, nausea, vomiting, diarrhea, abdominal pain, dysuria, hematuria.  He complains of left and right flank pain, left greater than right.  In the emergency department, the patient had fever 1 2.4 F with tachycardia.  He remained hemodynamically stable with oxygen saturation of 92% on room air.  CT angiogram of the chest was negative for pulmonary embolus or dissection.  There was no consolidation or pleural effusion.  CT abdomen and pelvis was unremarkable for acute findings.  There was nonobstructive bilateral renal  calculi.  The patient was started on Zosyn, IV fluids, and admitted for further workup. The patient followed up with urology on the morning of 03/15/2018.  There was concern for UTI.  The patient was prescribed doxycycline of which she took 1 dose prior to going to the emergency department.  Assessment/Plan: Sepsis/MSSA bacteremia/ PNA -Source likely PNA, less likely spinal cord stimulator/battery pack ( CT no signs of infection) -he was started on  Zosyn/Vanc pn admission, BCI D positive for M SSA, infectious disease consulted, antibiotic changed to Ancef, UA 6-30 WBC, urine culture insignificant growth, Viral respiratory panel--neg -Lactic acid coming down from 1.8-1.1 , - procalcitonin--2.15>>>1.28, now down to 0.77 -d/c IV fluids -my colleague Dr Tat spoke with pt's pain management specialist-->stimulator is MRI compatible using 1.5T magnet, per radiology, Dr. Hurshel Party try to attempt MRI here at [facility] -will get TEE on Monday  Acute kidney injury -Baseline creatinine 1.0-1.2 -Presenting creatinine 2.09 -Secondary to sepsis and volume depletion in setting of NSAID use. Urine culture insignificant growth -cr 1.02 today,  d/c ivf   Acute respiratory failure with hypoxia -due to PNA -03/17/18--personally reviewed CXR--increase interstitial markings, RLL opacity -stable on 2L -d/c ivf, prn lasix  Essential hypertension -Holding losartan in the setting of AKI -Continue amlodipine  Acute metabolic encephalopathy -Secondary to sepsis -Remains confused but improving -Significant other states that he has not used his Percocet in the past 48 hours prior to admission -Per family patient was confused at [facility] when he was hospitalized, CT head there are no  acute findings -Consider repeat CT of the head if confusion does not improve  Myoclonus -Likely secondary to the patient's infectious process and gabapentin setting of renal failure -EEG -Reduced gabapentin  dose  Cognitive impairment -Continue Aricept and Namenda  Chronic pain syndrome -Minimize hypnotic and opioids -Continue gabapentin--decrease dose for renal failure  Depression -Continue home dose of venlafaxine -Holding trazodone in the setting of encephalopathy  Hyperlipidemia -Continue statin  Coronary artery disease -No chest pain presently -Personally reviewed EKG--sinus rhythm, no ST-T wave changes -Personally reviewed chest x-ray-increased interstitial markings without consolidation  Hypokalemia -replete -check mag  Transaminasemia -due to sepsis -no RUQ pain -am CMP  Body mass index is 38.25 kg/m.  Disposition Plan:   Home in 2-3 days  Family Communication:   Significant other and daughter updated at bedside Consultants:  none  Code Status:  FULL  DVT Prophylaxis:  [place] Lovenox   Procedures: As Listed in Progress Note Above  Antibiotics: Zosyn 3/27>>>    Subjective: Confused, continue to have oxygen requirement, upper airway sounds, appear some edema bilateral lower extremity  Fever coming down,   Family at bedside  Patient continues to complain of left greater than right sided flank pain.  He has a nonproductive cough.  Objective: Vitals:   03/18/18 0750 03/18/18 1300 03/18/18 1447 03/18/18 1638  BP:   (!) 183/73   Pulse:   99   Resp:   20   Temp:   100.2 F (37.9 C) 98.4 F (36.9 C)  TempSrc:   Oral Axillary  SpO2: 97% 93% 96%   Weight:      Height:        Intake/Output Summary (Last 24 hours) at 03/18/2018 1920 Last data filed at 03/18/2018 1827 Gross per 24 hour  Intake 1820 ml  Output 1900 ml  Net -80 ml   Weight change:  Exam:   General:  confused  HEENT: No icterus, No thrush, No neck mass, [place]/AT  Cardiovascular: RRR, S1/S2, no rubs, no gallops  Respiratory: Bibasilar rales, right greater than left.  No wheezing. Upper air way sounds?  Abdomen: Soft/+BS, non tender, non distended, no  guarding  Extremities: trace bilateral ankle/pedal  edema, No lymphangitis, No petechiae, No rashes, no synovitis   Data Reviewed: I have personally reviewed following labs and imaging studies Basic Metabolic Panel: Recent Labs  Lab 03/15/18 1859 03/16/18 0429 03/16/18 0826 03/17/18 0609  NA 137 137  --  135  K 3.7 3.3*  --  3.8  CL 101 101  --  99*  CO2 21* 23  --  25  GLUCOSE 158* 131*  --  108*  BUN 27* 25*  --  20  CREATININE 2.09* 1.53*  --  1.02  CALCIUM 9.2 8.6*  --  8.4*  MG  --   --  2.0 2.2   Liver Function Tests: Recent Labs  Lab 03/15/18 1859 03/17/18 0609  AST 50* 47*  ALT 55 41  ALKPHOS 118 109  BILITOT 1.4* 0.9  PROT 7.7 6.7  ALBUMIN 2.7* 2.3*   No results for input(s): LIPASE, AMYLASE in the last 168 hours. No results for input(s): AMMONIA in the last 168 hours. Coagulation Profile: No results for input(s): INR, PROTIME in the last 168 hours. CBC: Recent Labs  Lab 03/15/18 1859 03/16/18 0429 03/17/18 0609  WBC 25.7* 22.9* 18.2*  NEUTROABS 24.2*  --   --   HGB 11.4* 10.7* 11.0*  HCT 35.3* 33.7* 34.7*  MCV 83.6 83.4 84.4  PLT  435* 428* 410*   Cardiac Enzymes: Recent Labs  Lab 03/15/18 1859 03/16/18 0826 03/17/18 0609  CKTOTAL  --  438* 113  TROPONINI <0.03  --   --    BNP: Invalid input(s): POCBNP CBG: No results for input(s): GLUCAP in the last 168 hours. HbA1C: No results for input(s): HGBA1C in the last 72 hours. Urine analysis:    Component Value Date/Time   COLORURINE AMBER (A) 03/15/2018 1830   APPEARANCEUR CLOUDY (A) 03/15/2018 1830   LABSPEC 1.029 03/15/2018 1830   PHURINE 5.0 03/15/2018 1830   GLUCOSEU 50 (A) 03/15/2018 1830   HGBUR MODERATE (A) 03/15/2018 1830   BILIRUBINUR MODERATE (A) 03/15/2018 1830   KETONESUR NEGATIVE 03/15/2018 1830   PROTEINUR 100 (A) 03/15/2018 1830   UROBILINOGEN 0.2 03/05/2011 1909   NITRITE NEGATIVE 03/15/2018 1830   LEUKOCYTESUR NEGATIVE 03/15/2018 1830   Sepsis  Labs: @LABRCNTIP (procalcitonin:4,lacticidven:4) ) Recent Results (from the past 240 hour(s))  Urine culture     Status: Abnormal   Collection Time: 03/15/18  6:30 PM  Result Value Ref Range Status   Specimen Description   Final    URINE, RANDOM Performed at [facility], [address] [place], [place] [address]    Special Requests   Final    NONE Performed at [facility], [address], [place], [place] [address]    Culture (A)  Final    <10,000 COLONIES/mL INSIGNIFICANT GROWTH Performed at [facility], [place] [address] [place], [place] [address]    Report Status 03/17/2018 FINAL  Final  Culture, blood (routine x 2)     Status: None (Preliminary result)   Collection Time: 03/15/18  8:00 PM  Result Value Ref Range Status   Specimen Description   Final    BLOOD RIGHT WRIST DRAWN BY RN Performed at [facility], [address] [place], [place] [address]    Special Requests   Final    BOTTLES DRAWN AEROBIC AND ANAEROBIC Blood Culture adequate volume Performed at [facility], [address] [place], [place] [address]    Culture  Setup Time   Final    GRAM POSITIVE COCCI ANAEROBIC BOTTLE Gram Stain Report Called to,Read Back By and Verified With: JOHNSON,[id number]  BY MATTHEWS, B 3.30.19 [facility]    Culture GRAM POSITIVE COCCI  Final   Report Status PENDING  Incomplete  Blood Culture ID Panel (Reflexed)     Status: Abnormal   Collection Time: 03/15/18  8:00 PM  Result Value Ref Range Status   Enterococcus species NOT DETECTED NOT DETECTED Final   Listeria monocytogenes NOT DETECTED NOT DETECTED Final   Staphylococcus species DETECTED (A) NOT DETECTED Final    Comment: CRITICAL RESULT CALLED TO, READ BACK BY AND VERIFIED WITH: NATE HAYES PHARMD AT 0954 ON [id number] BY SJW    Staphylococcus aureus DETECTED (A) NOT DETECTED Final    Comment: CRITICAL RESULT CALLED TO, READ BACK BY AND VERIFIED WITH: NATE HAYES PHARMD AT 0954 ON [id number] BY SJW    Methicillin  resistance NOT DETECTED NOT DETECTED Final   Streptococcus species NOT DETECTED NOT DETECTED Final   Streptococcus agalactiae NOT DETECTED NOT DETECTED Final   Streptococcus pneumoniae NOT DETECTED NOT DETECTED Final   Streptococcus pyogenes NOT DETECTED NOT DETECTED Final   Acinetobacter baumannii NOT DETECTED NOT DETECTED Final   Enterobacteriaceae species NOT DETECTED NOT DETECTED Final   Enterobacter cloacae complex NOT DETECTED NOT DETECTED Final   Escherichia coli NOT DETECTED NOT DETECTED Final   Klebsiella  oxytoca NOT DETECTED NOT DETECTED Final   Klebsiella pneumoniae NOT DETECTED NOT DETECTED Final   Proteus species NOT DETECTED NOT DETECTED Final   Serratia marcescens NOT DETECTED NOT DETECTED Final   Haemophilus influenzae NOT DETECTED NOT DETECTED Final   Neisseria meningitidis NOT DETECTED NOT DETECTED Final   Pseudomonas aeruginosa NOT DETECTED NOT DETECTED Final   Candida albicans NOT DETECTED NOT DETECTED Final   Candida glabrata NOT DETECTED NOT DETECTED Final   Candida krusei NOT DETECTED NOT DETECTED Final   Candida parapsilosis NOT DETECTED NOT DETECTED Final   Candida tropicalis NOT DETECTED NOT DETECTED Final    Comment: Performed at [facility], [place] [address], [place], [place] [address]  Culture, blood (routine x 2)     Status: None (Preliminary result)   Collection Time: 03/15/18  8:21 PM  Result Value Ref Range Status   Specimen Description BLOOD LEFT HAND  Final   Special Requests   Final    BOTTLES DRAWN AEROBIC AND ANAEROBIC Blood Culture adequate volume   Culture   Final    NO GROWTH 3 DAYS Performed at [facility], [address] [place], [place] [address]    Report Status PENDING  Incomplete  Respiratory Panel by PCR     Status: None   Collection Time: 03/16/18 11:40 AM  Result Value Ref Range Status   Adenovirus NOT DETECTED NOT DETECTED Final   Coronavirus 229E NOT DETECTED NOT DETECTED Final   Coronavirus HKU1 NOT DETECTED NOT DETECTED  Final   Coronavirus NL63 NOT DETECTED NOT DETECTED Final   Coronavirus OC43 NOT DETECTED NOT DETECTED Final   Metapneumovirus NOT DETECTED NOT DETECTED Final   Rhinovirus / Enterovirus NOT DETECTED NOT DETECTED Final   Influenza A NOT DETECTED NOT DETECTED Final   Influenza B NOT DETECTED NOT DETECTED Final   Parainfluenza Virus 1 NOT DETECTED NOT DETECTED Final   Parainfluenza Virus 2 NOT DETECTED NOT DETECTED Final   Parainfluenza Virus 3 NOT DETECTED NOT DETECTED Final   Parainfluenza Virus 4 NOT DETECTED NOT DETECTED Final   Respiratory Syncytial Virus NOT DETECTED NOT DETECTED Final   Bordetella pertussis NOT DETECTED NOT DETECTED Final   Chlamydophila pneumoniae NOT DETECTED NOT DETECTED Final   Mycoplasma pneumoniae NOT DETECTED NOT DETECTED Final    Comment: Performed at [facility], [place] [address] [place], [place] [address]  Culture, blood (routine x 2)     Status: None (Preliminary result)   Collection Time: 03/18/18 12:38 PM  Result Value Ref Range Status   Specimen Description LEFT ANTECUBITAL  Final   Special Requests   Final    BOTTLES DRAWN AEROBIC AND ANAEROBIC Blood Culture adequate volume Performed at [facility], [address], [place], [place] [address]    Culture PENDING  Incomplete   Report Status PENDING  Incomplete  Culture, blood (routine x 2)     Status: None (Preliminary result)   Collection Time: 03/18/18 12:45 PM  Result Value Ref Range Status   Specimen Description BLOOD LEFT HAND  Final   Special Requests   Final    BOTTLES DRAWN AEROBIC AND ANAEROBIC Blood Culture results may not be optimal due to an inadequate volume of blood received in culture bottles Performed at [facility], [address] [place], [place] [address]    Culture PENDING  Incomplete   Report Status PENDING  Incomplete     Scheduled Meds: . amLODipine  10 mg Oral Daily  . aspirin EC  81 mg Oral Daily  . cycloSPORINE  1 drop Both Eyes BID  . docusate sodium  200 mg  Oral BID  . donepezil  10 mg Oral QHS  . enoxaparin (LOVENOX) injection  40 mg Subcutaneous Q24H  . finasteride  5 mg Oral Daily  . gabapentin  600 mg Oral TID  . ipratropium-albuterol  3 mL Nebulization TID  . latanoprost  1 drop Both Eyes QHS  . memantine  10 mg Oral BID  . pantoprazole  40 mg Oral Daily  . pravastatin  10 mg Oral QPM  . traZODone  100 mg Oral QHS  . venlafaxine XR  150 mg Oral BID  . vitamin B-12  1,000 mcg Oral Daily   Continuous Infusions: .  ceFAZolin (ANCEF) IV Stopped (03/18/18 1904)    Procedures/Studies: Ct Angio Chest Pe W And/or Wo Contrast  Result Date: 03/15/2018 CLINICAL DATA:  72 year old male with acute generalized abdominal pain and fever. EXAM: CT ANGIOGRAPHY CHEST CT ABDOMEN AND PELVIS WITH CONTRAST TECHNIQUE: Multidetector CT imaging of the chest was performed using the standard protocol during bolus administration of intravenous contrast. Multiplanar CT image reconstructions and MIPs were obtained to evaluate the vascular anatomy. Multidetector CT imaging of the abdomen and pelvis was performed using the standard protocol during bolus administration of intravenous contrast. CONTRAST:  60mL ISOVUE-370 IOPAMIDOL (ISOVUE-370) INJECTION 76% COMPARISON:  Abdominal CT dated 03/15/2018 FINDINGS: CTA CHEST FINDINGS Cardiovascular: Top-normal cardiac size. No pericardial effusion. Mild atherosclerotic calcification of the thoracic aorta. No aneurysmal dilatation or evidence of dissection. The origins of the great vessels of the aortic arch are patent. Evaluation of the pulmonary arteries is limited due to suboptimal opacification and respiratory motion artifact. No large or central pulmonary artery embolus identified. Mediastinum/Nodes: There is no hilar or mediastinal adenopathy. Esophagus and the thyroid gland are grossly unremarkable. No mediastinal fluid collection. Lungs/Pleura: Bibasilar linear and streaky densities, likely atelectasis/scarring. There is a 4  mm left lower lobe nodule (series 6, image 87). There is no focal consolidation, pleural effusion, or pneumothorax. The central airways are patent. Musculoskeletal: There is degenerative changes of the spine. No acute fracture. Stimulator device extending into the upper thoracic spine. Review of the MIP images confirms the above findings. CT ABDOMEN and PELVIS FINDINGS No intra-abdominal free air or free fluid. Hepatobiliary: Apparent mild fatty infiltration of the liver. No intrahepatic biliary ductal dilatation. The gallbladder is unremarkable. Pancreas: Unremarkable. No pancreatic ductal dilatation or surrounding inflammatory changes. Spleen: Normal in size without focal abnormality. Adrenals/Urinary Tract: The adrenal glands are unremarkable. Small nonobstructing bilateral renal calculi measure up to 5 mm in the upper pole of the left kidney. There is no hydronephrosis on either side. A 1 cm hypodense lesion from the superior pole of the right kidney demonstrates fluid attenuation most consistent with a cyst. The visualized ureters and urinary bladder appear unremarkable. Stomach/Bowel: Small hiatal hernia. There is no bowel obstruction or active inflammation. Air is noted throughout the colon. The appendix is normal. Vascular/Lymphatic: Mild aortoiliac atherosclerotic disease. No portal venous gas. There is no adenopathy. Reproductive: The prostate and seminal vesicles are grossly unremarkable. Other: Small fat containing bilateral inguinal and umbilical hernias. No fluid collection or inflammatory changes. Musculoskeletal: There is osteopenia with multilevel degenerative changes. L1-L3 posterior fusion hardware and L4-L5 and L5-S1 interbody disc spacer. No acute osseous pathology. Review of the MIP images confirms the above findings. IMPRESSION: 1. No acute intrathoracic, abdominal, or pelvic pathology. 2. Mild fatty liver. 3. Small nonobstructing bilateral renal  calculi.  No hydronephrosis. 4. Mild air  distention of the colon. No bowel obstruction or active inflammation. Normal appendix. 5.  Aortic Atherosclerosis (ICD10-I70.0). 6. A 4 mm left lower lobe pulmonary nodule. No follow-up needed if patient is low-risk. Non-contrast chest CT can be considered in 12 months if patient is high-risk. This recommendation follows the consensus statement: Guidelines for Management of Incidental Pulmonary Nodules Detected on CT Images: From the Fleischner Society 2017; Radiology 2017; [phone]. 7. Electronically Signed   By: Anner Crete M.D.   On: 03/15/2018 22:25   Ct Abdomen Pelvis W Contrast  Result Date: 03/15/2018 CLINICAL DATA:  72 year old male with acute generalized abdominal pain and fever. EXAM: CT ANGIOGRAPHY CHEST CT ABDOMEN AND PELVIS WITH CONTRAST TECHNIQUE: Multidetector CT imaging of the chest was performed using the standard protocol during bolus administration of intravenous contrast. Multiplanar CT image reconstructions and MIPs were obtained to evaluate the vascular anatomy. Multidetector CT imaging of the abdomen and pelvis was performed using the standard protocol during bolus administration of intravenous contrast. CONTRAST:  43mL ISOVUE-370 IOPAMIDOL (ISOVUE-370) INJECTION 76% COMPARISON:  Abdominal CT dated 03/15/2018 FINDINGS: CTA CHEST FINDINGS Cardiovascular: Top-normal cardiac size. No pericardial effusion. Mild atherosclerotic calcification of the thoracic aorta. No aneurysmal dilatation or evidence of dissection. The origins of the great vessels of the aortic arch are patent. Evaluation of the pulmonary arteries is limited due to suboptimal opacification and respiratory motion artifact. No large or central pulmonary artery embolus identified. Mediastinum/Nodes: There is no hilar or mediastinal adenopathy. Esophagus and the thyroid gland are grossly unremarkable. No mediastinal fluid collection. Lungs/Pleura: Bibasilar linear and streaky densities, likely atelectasis/scarring. There is  a 4 mm left lower lobe nodule (series 6, image 87). There is no focal consolidation, pleural effusion, or pneumothorax. The central airways are patent. Musculoskeletal: There is degenerative changes of the spine. No acute fracture. Stimulator device extending into the upper thoracic spine. Review of the MIP images confirms the above findings. CT ABDOMEN and PELVIS FINDINGS No intra-abdominal free air or free fluid. Hepatobiliary: Apparent mild fatty infiltration of the liver. No intrahepatic biliary ductal dilatation. The gallbladder is unremarkable. Pancreas: Unremarkable. No pancreatic ductal dilatation or surrounding inflammatory changes. Spleen: Normal in size without focal abnormality. Adrenals/Urinary Tract: The adrenal glands are unremarkable. Small nonobstructing bilateral renal calculi measure up to 5 mm in the upper pole of the left kidney. There is no hydronephrosis on either side. A 1 cm hypodense lesion from the superior pole of the right kidney demonstrates fluid attenuation most consistent with a cyst. The visualized ureters and urinary bladder appear unremarkable. Stomach/Bowel: Small hiatal hernia. There is no bowel obstruction or active inflammation. Air is noted throughout the colon. The appendix is normal. Vascular/Lymphatic: Mild aortoiliac atherosclerotic disease. No portal venous gas. There is no adenopathy. Reproductive: The prostate and seminal vesicles are grossly unremarkable. Other: Small fat containing bilateral inguinal and umbilical hernias. No fluid collection or inflammatory changes. Musculoskeletal: There is osteopenia with multilevel degenerative changes. L1-L3 posterior fusion hardware and L4-L5 and L5-S1 interbody disc spacer. No acute osseous pathology. Review of the MIP images confirms the above findings. IMPRESSION: 1. No acute intrathoracic, abdominal, or pelvic pathology. 2. Mild fatty liver. 3. Small nonobstructing bilateral renal calculi.  No hydronephrosis. 4. Mild air  distention of the colon. No bowel obstruction or active inflammation. Normal appendix. 5.  Aortic Atherosclerosis (ICD10-I70.0). 6. A 4 mm left lower lobe pulmonary nodule. No follow-up needed if patient is low-risk. Non-contrast chest CT can be considered  in 12 months if patient is high-risk. This recommendation follows the consensus statement: Guidelines for Management of Incidental Pulmonary Nodules Detected on CT Images: From the Fleischner Society 2017; Radiology 2017; [phone]. 7. Electronically Signed   By: Anner Crete M.D.   On: 03/15/2018 22:25   Dg Chest Port 1 View  Result Date: 03/17/2018 CLINICAL DATA:  Respiratory distress.  History of prostate carcinoma EXAM: PORTABLE CHEST 1 VIEW COMPARISON:  Chest radiograph and chest CT March 15, 2018 FINDINGS: There is new airspace consolidation in the medial right base, suspicious for pneumonia. The lungs elsewhere are clear. There is cardiomegaly with pulmonary venous hypertension. There is a small right pleural effusion. No adenopathy. There is a stimulator with lead tips at the cervical-thoracic junction. There is postoperative change in the lower cervical spine. IMPRESSION: Medial right base consolidation felt to represent pneumonia. Underlying pulmonary vascular congestion. Small right pleural effusion. Electronically Signed   By: Lowella Grip III M.D.   On: 03/17/2018 09:48   Dg Chest Portable 1 View  Result Date: 03/15/2018 CLINICAL DATA:  72 year old male status post fall getting into car today. Confusion beginning about 1630 hours. Shortness of breath. EXAM: PORTABLE CHEST 1 VIEW COMPARISON:  Portable chest 05/04/2016 and earlier. FINDINGS: Portable AP upright view at 1854 hours. Chronic cardiomegaly. Stable mediastinal contours. Visualized tracheal air column is within normal limits. Mild respiratory motion artifact. Suggestion of increased pulmonary vascularity compared to 2017. No pneumothorax, pleural effusion or consolidation is  evident. Prior cervical spine surgery with spinal stimulator device coursing to the cervical region. No acute osseous abnormality identified. Negative visible bowel gas pattern. IMPRESSION: Cardiomegaly with appearance of increased pulmonary vascularity from prior studies. Consider acute pulmonary edema. Electronically Signed   By: Genevie Ann M.D.   On: 03/15/2018 19:09    Florencia Reasons, MD PhD [facility] Pager [phone]  If 7PM-7AM, please contact night-coverage [email or web address] Password [email or web address] 03/18/2018, 7:20 PM   LOS: 2 days

## 2018-03-18 NOTE — Consult Note (Signed)
[facility]    Date of Admission:  03/15/2018           Day 3 vancomycin        Day 3 piperacillin tazobactam       Reason for Consult: Automatic consultation for staph aureus bacteremia     Assessment: Cody Velez has MSSA bacteremia.  Antibiotic therapy prior to admission could account for the relatively long time to positivity and the negative second set.  If he has been having fever and chills over the past month I would be very concerned about deep infection.  He had an echocardiogram done on 03/13/2018 before discharge in [place].  The written report did not mention any vegetations.  He had LVH and grade 1 diastolic dysfunction only.  I would consider proceeding with TEE in the near future.  I agree with imaging of his recently placed spinal cord stimulator if at all possible.  I have ordered repeat blood cultures.  I will narrow antibiotic therapy to cefazolin.  Plan: 1. Narrow antibiotics to IV cefazolin 2. Repeat blood cultures 3. Recommend TEE and spinal cord stimulator imaging if at all possible  Principal Problem:   Bacteremia due to methicillin susceptible Staphylococcus aureus (MSSA) Active Problems:   Weakness   Fever   Sepsis ([place])   AKI (acute kidney injury) ([place])   Acute metabolic encephalopathy   HYPERCHOLESTEROLEMIA   Essential hypertension, benign   CAD, NATIVE VESSEL   GERD   Chronic back pain   History of cerebrovascular disease   Nephrolithiasis   Cognitive impairment   Chronic pain syndrome   Obesity   Scheduled Meds: . amLODipine  10 mg Oral Daily  . aspirin EC  81 mg Oral Daily  . cycloSPORINE  1 drop Both Eyes BID  . docusate sodium  200 mg Oral BID  . donepezil  10 mg Oral QHS  . enoxaparin (LOVENOX) injection  40 mg Subcutaneous Q24H  . finasteride  5 mg Oral Daily  . gabapentin  600 mg Oral TID  . ipratropium-albuterol  3 mL Nebulization Q6H  . latanoprost  1 drop Both Eyes QHS  . memantine  10  mg Oral BID  . pantoprazole  40 mg Oral Daily  . pravastatin  10 mg Oral QPM  . traZODone  100 mg Oral QHS  . venlafaxine XR  150 mg Oral BID  . vitamin B-12  1,000 mcg Oral Daily   Continuous Infusions: . piperacillin-tazobactam (ZOSYN)  IV 3.375 g (03/18/18 1440)  . vancomycin Stopped (03/18/18 0355)   PRN Meds:.acetaminophen **OR** acetaminophen, fluticasone, hydrALAZINE, ondansetron **OR** ondansetron (ZOFRAN) IV, oxyCODONE-acetaminophen  HPI: Cody Velez is a 72 y.o. male with chronic pain syndrome, obesity, hypertension, dyslipidemia and coronary artery disease who had a spinal cord stimulator placed on 01/18/2018.  Apparently he has been having fever and chills over the past month.  More recently he has had confusion and weakness.  While he was returning from [facility] he fell and could not get up leading to admission to [facility] in [place].  He was hospitalized and there from 03/11/2018 until 03/13/2018.  He was discovered to have bilateral nephrolithiasis.  A urine culture done there was negative.  No blood cultures were obtained.  I am not sure if he was treated with any antibiotics.  He saw his local urologist on 03/15/2018 and was given a prescription for doxycycline to treat a  possible UTI.  He took 1 dose.  He had another fall leading to admission at [facility].  He has had persistent fevers there.  1 of 2 admission blood cultures is now growing MSSA.   Review of Systems: Review of Systems  Unable to perform ROS: Other  Constitutional:       This is a remote consultation so no review of systems was obtained.    Past Medical History:  Diagnosis Date  . Allergic rhinitis   . Arthritis   . Asthma    as a child  . B12 deficiency   . Cancer [facility])    prostate  . Cervicogenic headache 01/28/2016  . Chronic back pain   . Coronary atherosclerosis of native coronary artery    Mild nonobstructive CAD at catheterization January 2015  .  Depression   . Essential hypertension, benign   . Falls   . GERD (gastroesophageal reflux disease)   . History of cerebrovascular disease 07/23/2015  . History of pneumonia 02/2011  . Hyperlipidemia   . Kidney stone   . Memory difficulty 07/23/2015  . OSA (obstructive sleep apnea)    CPAP - Dr. [name]  . Prostate cancer ([place])   . PTSD (post-traumatic stress disorder)    [place]  . Rectal bleeding     Social History   Tobacco Use  . Smoking status: Former Smoker    Types: Cigarettes    Last attempt to quit: 12/20/1958    Years since quitting: 59.2  . Smokeless tobacco: Current User    Types: Chew  . Tobacco comment: Quit chewing 04/2015  Substance Use Topics  . Alcohol use: Yes    Alcohol/week: 0.0 oz    Comment: couple bottles of wine per month  . Drug use: No    Types: Marijuana    Comment: "last used marijuana ~ 1969"    Family History  Problem Relation Age of Onset  . Emphysema Father   . Heart failure Father   . Lung cancer Father   . CAD Father   . Colon cancer Mother   . Stroke Mother   . Breast cancer Mother   . Stroke Sister   . Heart attack Brother   . Dementia Paternal Uncle   . Emphysema Maternal Grandmother   . Stroke Maternal Grandmother   . Asthma Other        grandson  . Heart disease Paternal Grandfather   . Anesthesia problems Neg Hx   . Hypotension Neg Hx   . Malignant hyperthermia Neg Hx   . Pseudochol deficiency Neg Hx    No Known Allergies  OBJECTIVE: Blood pressure (!) 183/73, pulse 99, temperature 100.2 F (37.9 C), temperature source Oral, resp. rate 20, height 6\' 3"  (1.905 m), weight (!) 306 lb (138.8 kg), SpO2 96 %.  Physical Exam  Constitutional:  This is a remote consultation so no physical exam was performed.    Lab Results Lab Results  Component Value Date   WBC 18.2 (H) 03/17/2018   HGB 11.0 (L) 03/17/2018   HCT 34.7 (L) 03/17/2018   MCV 84.4 03/17/2018   PLT 410 (H) 03/17/2018    Lab Results  Component Value Date     CREATININE 1.02 03/17/2018   BUN 20 03/17/2018   NA 135 03/17/2018   K 3.8 03/17/2018   CL 99 (L) 03/17/2018   CO2 25 03/17/2018    Lab Results  Component Value Date   ALT 41 03/17/2018   AST 47 (  H) 03/17/2018   ALKPHOS 109 03/17/2018   BILITOT 0.9 03/17/2018     Microbiology: Recent Results (from the past 240 hour(s))  Urine culture     Status: Abnormal   Collection Time: 03/15/18  6:30 PM  Result Value Ref Range Status   Specimen Description   Final    URINE, RANDOM Performed at [facility], [address] [place], [place] [address]    Special Requests   Final    NONE Performed at [facility], [address], [place], [place] [address]    Culture (A)  Final    <10,000 COLONIES/mL INSIGNIFICANT GROWTH Performed at [facility], [place] [address], [place], [place] [address]    Report Status 03/17/2018 FINAL  Final  Culture, blood (routine x 2)     Status: None (Preliminary result)   Collection Time: 03/15/18  8:00 PM  Result Value Ref Range Status   Specimen Description   Final    BLOOD RIGHT WRIST DRAWN BY RN Performed at [facility], [address] [place], [place] [address]    Special Requests   Final    BOTTLES DRAWN AEROBIC AND ANAEROBIC Blood Culture adequate volume Performed at [facility], [address] [place], [place] [address]    Culture  Setup Time   Final    GRAM POSITIVE COCCI ANAEROBIC BOTTLE Gram Stain Report Called to,Read Back By and Verified With: JOHNSON,[email or web address]  BY MATTHEWS, B 3.30.19 [facility]    Culture GRAM POSITIVE COCCI  Final   Report Status PENDING  Incomplete  Blood Culture ID Panel (Reflexed)     Status: Abnormal   Collection Time: 03/15/18  8:00 PM  Result Value Ref Range Status   Enterococcus species NOT DETECTED NOT DETECTED Final   Listeria monocytogenes NOT DETECTED NOT DETECTED Final   Staphylococcus species DETECTED (A) NOT DETECTED Final    Comment: CRITICAL RESULT CALLED TO, READ BACK BY AND VERIFIED  WITH: NATE HAYES PHARMD AT 0954 ON [id number] BY SJW    Staphylococcus aureus DETECTED (A) NOT DETECTED Final    Comment: CRITICAL RESULT CALLED TO, READ BACK BY AND VERIFIED WITH: NATE HAYES PHARMD AT 0954 ON [id number] BY SJW    Methicillin resistance NOT DETECTED NOT DETECTED Final   Streptococcus species NOT DETECTED NOT DETECTED Final   Streptococcus agalactiae NOT DETECTED NOT DETECTED Final   Streptococcus pneumoniae NOT DETECTED NOT DETECTED Final   Streptococcus pyogenes NOT DETECTED NOT DETECTED Final   Acinetobacter baumannii NOT DETECTED NOT DETECTED Final   Enterobacteriaceae species NOT DETECTED NOT DETECTED Final   Enterobacter cloacae complex NOT DETECTED NOT DETECTED Final   Escherichia coli NOT DETECTED NOT DETECTED Final   Klebsiella oxytoca NOT DETECTED NOT DETECTED Final   Klebsiella pneumoniae NOT DETECTED NOT DETECTED Final   Proteus species NOT DETECTED NOT DETECTED Final   Serratia marcescens NOT DETECTED NOT DETECTED Final   Haemophilus influenzae NOT DETECTED NOT DETECTED Final   Neisseria meningitidis NOT DETECTED NOT DETECTED Final   Pseudomonas aeruginosa NOT DETECTED NOT DETECTED Final   Candida albicans NOT DETECTED NOT DETECTED Final   Candida glabrata NOT DETECTED NOT DETECTED Final   Candida krusei NOT DETECTED NOT DETECTED Final   Candida parapsilosis NOT DETECTED NOT DETECTED Final   Candida tropicalis NOT DETECTED NOT DETECTED Final    Comment: Performed at [facility], [address] [place], [place] [address]  Culture, blood (routine x 2)     Status: None (Preliminary result)  Collection Time: 03/15/18  8:21 PM  Result Value Ref Range Status   Specimen Description BLOOD LEFT HAND  Final   Special Requests   Final    BOTTLES DRAWN AEROBIC AND ANAEROBIC Blood Culture adequate volume   Culture   Final    NO GROWTH 3 DAYS Performed at [facility], [address] [place], [place] [address]    Report Status PENDING  Incomplete  Respiratory  Panel by PCR     Status: None   Collection Time: 03/16/18 11:40 AM  Result Value Ref Range Status   Adenovirus NOT DETECTED NOT DETECTED Final   Coronavirus 229E NOT DETECTED NOT DETECTED Final   Coronavirus HKU1 NOT DETECTED NOT DETECTED Final   Coronavirus NL63 NOT DETECTED NOT DETECTED Final   Coronavirus OC43 NOT DETECTED NOT DETECTED Final   Metapneumovirus NOT DETECTED NOT DETECTED Final   Rhinovirus / Enterovirus NOT DETECTED NOT DETECTED Final   Influenza A NOT DETECTED NOT DETECTED Final   Influenza B NOT DETECTED NOT DETECTED Final   Parainfluenza Virus 1 NOT DETECTED NOT DETECTED Final   Parainfluenza Virus 2 NOT DETECTED NOT DETECTED Final   Parainfluenza Virus 3 NOT DETECTED NOT DETECTED Final   Parainfluenza Virus 4 NOT DETECTED NOT DETECTED Final   Respiratory Syncytial Virus NOT DETECTED NOT DETECTED Final   Bordetella pertussis NOT DETECTED NOT DETECTED Final   Chlamydophila pneumoniae NOT DETECTED NOT DETECTED Final   Mycoplasma pneumoniae NOT DETECTED NOT DETECTED Final    Comment: Performed at [facility], [place] [address], [place], [place] [address]  Culture, blood (routine x 2)     Status: None (Preliminary result)   Collection Time: 03/18/18 12:38 PM  Result Value Ref Range Status   Specimen Description LEFT ANTECUBITAL  Final   Special Requests   Final    BOTTLES DRAWN AEROBIC AND ANAEROBIC Blood Culture adequate volume Performed at [facility], [address] [place], [place] [address]    Culture PENDING  Incomplete   Report Status PENDING  Incomplete  Culture, blood (routine x 2)     Status: None (Preliminary result)   Collection Time: 03/18/18 12:45 PM  Result Value Ref Range Status   Specimen Description BLOOD LEFT HAND  Final   Special Requests   Final    BOTTLES DRAWN AEROBIC AND ANAEROBIC Blood Culture results may not be optimal due to an inadequate volume of blood received in culture bottles Performed at [facility], [address] [place] [address],  [place], [place] [address]    Culture PENDING  Incomplete   Report Status PENDING  Incomplete    Michel Bickers, MD [place] for [place] [phone] pager   [phone] cell 03/18/2018, 3:42 PM

## 2018-03-18 NOTE — Progress Notes (Signed)
Pharmacy Antibiotic Note  Cody Velez is a 72 y.o. male admitted on 03/15/2018 with sepsis.  Pharmacy has been consulted for Vancomycin and Zosyn dosing.  Now transition to Ancef for MSSA bacteremia.  Plan: Ancef 2gm IV every 8 hours. Monitor labs, micro and vitals.   Height: 6\' 3"  (190.5 cm) Weight: (!) 306 lb (138.8 kg) IBW/kg (Calculated) : 84.5  Temp (24hrs), Avg:99.2 F (37.3 C), Min:98 F (36.7 C), Max:101.6 F (38.7 C)  Recent Labs  Lab 03/15/18 1859 03/15/18 2006 03/16/18 0429 03/17/18 0609  WBC 25.7*  --  22.9* 18.2*  CREATININE 2.09*  --  1.53* 1.02  LATICACIDVEN  --  1.84 1.1  --     Estimated Creatinine Clearance: 99.8 mL/min (by C-G formula based on SCr of 1.02 mg/dL).    No Known Allergies  Antimicrobials this admission: Vancomycin 3/28 >> 3/30 Zosyn 3/28 >> 3/30 Ancef 3/30 >>   Dose adjustments this admission: n/a   Microbiology results: 3/30 Repeat BCx: pending 3/27 BCx: 1 or 2 = MSSA 3/27 UCx: insignificant growth  Thank you for allowing pharmacy to be a part of this patient's care.  Pricilla Larsson 03/18/2018 4:51 PM

## 2018-03-19 ENCOUNTER — Inpatient Hospital Stay (HOSPITAL_COMMUNITY): Payer: Medicare Other

## 2018-03-19 LAB — C DIFFICILE QUICK SCREEN W PCR REFLEX
C Diff antigen: NEGATIVE
C Diff interpretation: NOT DETECTED
C Diff toxin: NEGATIVE

## 2018-03-19 LAB — CBC
HCT: 35.9 % — ABNORMAL LOW (ref 39.0–52.0)
Hemoglobin: 11.5 g/dL — ABNORMAL LOW (ref 13.0–17.0)
MCH: 26 pg (ref 26.0–34.0)
MCHC: 32 g/dL (ref 30.0–36.0)
MCV: 81 fL (ref 78.0–100.0)
Platelets: 481 10*3/uL — ABNORMAL HIGH (ref 150–400)
RBC: 4.43 MIL/uL (ref 4.22–5.81)
RDW: 15.7 % — ABNORMAL HIGH (ref 11.5–15.5)
WBC: 17.6 10*3/uL — ABNORMAL HIGH (ref 4.0–10.5)

## 2018-03-19 LAB — COMPREHENSIVE METABOLIC PANEL
ALT: 51 U/L (ref 17–63)
AST: 70 U/L — ABNORMAL HIGH (ref 15–41)
Albumin: 2 g/dL — ABNORMAL LOW (ref 3.5–5.0)
Alkaline Phosphatase: 115 U/L (ref 38–126)
Anion gap: 12 (ref 5–15)
BUN: 16 mg/dL (ref 6–20)
CO2: 28 mmol/L (ref 22–32)
Calcium: 8.6 mg/dL — ABNORMAL LOW (ref 8.9–10.3)
Chloride: 92 mmol/L — ABNORMAL LOW (ref 101–111)
Creatinine, Ser: 0.77 mg/dL (ref 0.61–1.24)
GFR calc Af Amer: >60 mL/min (ref >60)
GFR calc non Af Amer: >60 mL/min (ref >60)
Glucose, Bld: 116 mg/dL — ABNORMAL HIGH (ref 65–99)
Potassium: 2.9 mmol/L — ABNORMAL LOW (ref 3.5–5.1)
Sodium: 132 mmol/L — ABNORMAL LOW (ref 135–145)
Total Bilirubin: 1.1 mg/dL (ref 0.3–1.2)
Total Protein: 6.8 g/dL (ref 6.5–8.1)

## 2018-03-19 LAB — BLOOD GAS, ARTERIAL
Acid-Base Excess: 6.5 mmol/L — ABNORMAL HIGH (ref 0.0–2.0)
Bicarbonate: 30.9 mmol/L — ABNORMAL HIGH (ref 20.0–28.0)
Drawn by: 277331
O2 Content: 1 L/min
O2 Saturation: 98.4 %
Patient temperature: 37
pCO2 arterial: 32.1 mmHg (ref 32.0–48.0)
pH, Arterial: 7.566 — ABNORMAL HIGH (ref 7.350–7.450)
pO2, Arterial: 129 mmHg — ABNORMAL HIGH (ref 83.0–108.0)

## 2018-03-19 LAB — AMMONIA: Ammonia: 36 umol/L — ABNORMAL HIGH (ref 9–35)

## 2018-03-19 LAB — MAGNESIUM: Magnesium: 1.9 mg/dL (ref 1.7–2.4)

## 2018-03-19 MED ORDER — POTASSIUM CHLORIDE CRYS ER 20 MEQ PO TBCR
40.0000 meq | EXTENDED_RELEASE_TABLET | ORAL | Status: AC
  Administered 2018-03-19 (×2): 40 meq via ORAL
  Filled 2018-03-19 (×2): qty 2

## 2018-03-19 MED ORDER — SACCHAROMYCES BOULARDII 250 MG PO CAPS
250.0000 mg | ORAL_CAPSULE | Freq: Two times a day (BID) | ORAL | Status: DC
  Administered 2018-03-19 – 2018-03-29 (×16): 250 mg via ORAL
  Filled 2018-03-19 (×18): qty 1

## 2018-03-19 MED ORDER — IPRATROPIUM-ALBUTEROL 0.5-2.5 (3) MG/3ML IN SOLN
3.0000 mL | Freq: Two times a day (BID) | RESPIRATORY_TRACT | Status: DC
  Administered 2018-03-19 – 2018-03-30 (×20): 3 mL via RESPIRATORY_TRACT
  Filled 2018-03-19 (×23): qty 3

## 2018-03-19 NOTE — Progress Notes (Addendum)
PROGRESS NOTE  Cody Velez JJK:[id number] DOB: 10-Sep-1946 DOA: 03/15/2018 PCP: Asencion Noble, MD  Brief History:  72 year old male with a history of hypertension, hyperlipidemia, morbid obesity, coronary artery disease, chronic back pain status post spinal stimulator (C-spine) placed 01/18/18 presented with worsening confusion and a mechanical fall at home.  The patient significant other contributes to the history at this time as he is not able to provide good history secondary to his encephalopathy.  Apparently, the patient has had subjective fevers and chills for the better part of one month.  In the past week, he has had worsening confusion.  On 03/11/2018, the patient was returning from a trip from [facility].  Apparently he fell in the bathroom at [facility] and was not able to get up.  He was admitted to [facility]) on 03/11/2018 and discharged on 03/13/2018.  He was at his baseline at the time of discharge.  However, he has had increasing confusion on 03/14/2018 and 03/15/2018.  His significant other feels that the patient had been overusing his Percocet at home which may have been contributing to his falls and confusion.  However, she has taken away his Percocets since his discharge from [facility], he has not had any in the 2 days prior to this admission.  However, the patient has been increasing his use of ibuprofen for his pain.  The patient denies any worsening headache, neck pain, nausea, vomiting, diarrhea, abdominal pain, dysuria, hematuria.  He complains of left and right flank pain, left greater than right.  In the emergency department, the patient had fever 1 2.4 F with tachycardia.  He remained hemodynamically stable with oxygen saturation of 92% on room air.  CT angiogram of the chest was negative for pulmonary embolus or dissection.  There was no consolidation or pleural effusion.  CT abdomen and pelvis was unremarkable for acute findings.  There was nonobstructive bilateral renal  calculi.  The patient was started on Zosyn, IV fluids, and admitted for further workup. The patient followed up with urology on the morning of 03/15/2018.  There was concern for UTI.  The patient was prescribed doxycycline of which she took 1 dose prior to going to the emergency department.  Assessment/Plan: Sepsis/MSSA bacteremia/ PNA -Source likely PNA, less likely spinal cord stimulator/battery pack ( CT no signs of infection) -he was started on  Zosyn/Vanc pn admission, BCID positive for MSSA, infectious disease consulted, antibiotic changed to Ancef, UA 6-30 WBC, urine culture insignificant growth, Viral respiratory panel--neg -Lactic acid coming down from 1.8-1.1 , - procalcitonin--2.15>>>1.28, now down to 0.77 -d/c IV fluids -per  pt's pain management specialist-->stimulator is MRI compatible using 1.5T magnet, per radiology, Dr. Hurshel Party try to attempt MRI here at [facility] -will get TEE on Monday -consider mri spine once patient is less confused ( family reports patient has not been able to walk with bilateral lower extremity weakness since discharged from [place]) -will need picc line once repeat blood culture no growth -infectious disease input appreciated.  Acute kidney injury -Baseline creatinine 1.0-1.2 -Presenting creatinine 2.09 -Secondary to sepsis and volume depletion in setting of NSAID use. Urine culture insignificant growth -cr 0.77 today,  d/c ivf   Acute respiratory failure with hypoxia -due to PNA -03/17/18--personally reviewed CXR--increase interstitial markings, RLL opacity -stable on 2L -d/c ivf, prn lasix  Hypokalemia: replace k   Essential hypertension -Holding losartan in the setting of AKI -Continue amlodipine  Acute metabolic encephalopathy -Secondary  to sepsis -Remains confused but improving -Significant other states that he has not used his Percocet in the past 48 hours prior to admission -Per family patient was confused at [facility]  when he was hospitalized, CT head there are no acute findings -repeat CT of the head on 3/31 due to confusion does not improve, will check abg  Myoclonus -Likely secondary to the patient's infectious process and gabapentin setting of renal failure -EEG -Reduced gabapentin dose  Cognitive impairment -Continue Aricept and Namenda  Chronic pain syndrome -Minimize hypnotic and opioids -Continue gabapentin--decrease dose for renal failure  Depression -Continue home dose of venlafaxine -Holding trazodone in the setting of encephalopathy  Hyperlipidemia -Continue statin  Coronary artery disease -No chest pain presently -Personally reviewed EKG--sinus rhythm, no ST-T wave changes -Personally reviewed chest x-ray-increased interstitial markings without consolidation  Hypokalemia -replete -check mag  Transaminasemia -due to sepsis -no RUQ pain -am CMP  Body mass index is 38.55 kg/m.   Distended abdomen: /watery diarrhea Initial CT ab no acute findings,  Will repeat kub D/c stool softener, start probiotics, per daughter kim (who is a PA), stool is brown, no odor.  Disposition Plan:   Home in 2-3 days  Family Communication:   Significant other and daughter updated at bedside Consultants:  none  Code Status:  FULL  DVT Prophylaxis:  [place] Lovenox   Procedures: As Listed in Progress Note Above  Antibiotics: Zosyn 3/27>>>    Subjective: He appear more alert and interactive this am, but later during the day he become Confused, again , upper airway sounds, appear some edema bilateral lower extremity  He report flank pain has resolved, (initial presenting symptom when he was hospitalized at [facility]), today he c/o back pain  He has watery diarrhea today, he denies ab pain, no n/v, he does has distended abdomen  Fever coming down, tmax 100 last 24hrs  Family at bedside    Objective: Vitals:   03/18/18 1952 03/18/18 2300 03/19/18 0700  03/19/18 0739  BP:  (!) 161/82 (!) 145/73   Pulse: (!) 102 (!) 101 (!) 124   Resp: (!) 24 18 18    Temp:  100 F (37.8 C) 99.2 F (37.3 C)   TempSrc:  Oral Oral   SpO2: 92% 93% 94% 95%  Weight:   (!) 139.9 kg (308 lb 6.8 oz)   Height:        Intake/Output Summary (Last 24 hours) at 03/19/2018 0815 Last data filed at 03/19/2018 0500 Gross per 24 hour  Intake 1270 ml  Output 3600 ml  Net -2330 ml   Weight change: 1.1 kg (2 lb 6.8 oz) Exam:   General:  confused  HEENT: No icterus, No thrush, No neck mass, [place]/AT  Cardiovascular: RRR, S1/S2, no rubs, no gallops  Respiratory: Bibasilar rales, right greater than left.  No wheezing. Upper air way sounds?  Abdomen: distended abdomen, hyperactive bowel sounds, tight, but not tender, no guarding  Extremities: trace bilateral ankle/pedal  Edema, bilateral lower extremity barely able to lift against gravity , sensation intact   Data Reviewed: I have personally reviewed following labs and imaging studies Basic Metabolic Panel: Recent Labs  Lab 03/15/18 1859 03/16/18 0429 03/16/18 0826 03/17/18 0609 03/19/18 0642  NA 137 137  --  135 132*  K 3.7 3.3*  --  3.8 2.9*  CL 101 101  --  99* 92*  CO2 21* 23  --  25 28  GLUCOSE 158* 131*  --  108* 116*  BUN  27* 25*  --  20 16  CREATININE 2.09* 1.53*  --  1.02 0.77  CALCIUM 9.2 8.6*  --  8.4* 8.6*  MG  --   --  2.0 2.2 1.9   Liver Function Tests: Recent Labs  Lab 03/15/18 1859 03/17/18 0609 03/19/18 0642  AST 50* 47* 70*  ALT 55 41 51  ALKPHOS 118 109 115  BILITOT 1.4* 0.9 1.1  PROT 7.7 6.7 6.8  ALBUMIN 2.7* 2.3* 2.0*   No results for input(s): LIPASE, AMYLASE in the last 168 hours. Recent Labs  Lab 03/19/18 0642  AMMONIA 36*   Coagulation Profile: No results for input(s): INR, PROTIME in the last 168 hours. CBC: Recent Labs  Lab 03/15/18 1859 03/16/18 0429 03/17/18 0609 03/19/18 0642  WBC 25.7* 22.9* 18.2* 17.6*  NEUTROABS 24.2*  --   --   --   HGB 11.4*  10.7* 11.0* 11.5*  HCT 35.3* 33.7* 34.7* 35.9*  MCV 83.6 83.4 84.4 81.0  PLT 435* 428* 410* 481*   Cardiac Enzymes: Recent Labs  Lab 03/15/18 1859 03/16/18 0826 03/17/18 0609  CKTOTAL  --  438* 113  TROPONINI <0.03  --   --    BNP: Invalid input(s): POCBNP CBG: No results for input(s): GLUCAP in the last 168 hours. HbA1C: No results for input(s): HGBA1C in the last 72 hours. Urine analysis:    Component Value Date/Time   COLORURINE AMBER (A) 03/15/2018 1830   APPEARANCEUR CLOUDY (A) 03/15/2018 1830   LABSPEC 1.029 03/15/2018 1830   PHURINE 5.0 03/15/2018 1830   GLUCOSEU 50 (A) 03/15/2018 1830   HGBUR MODERATE (A) 03/15/2018 1830   BILIRUBINUR MODERATE (A) 03/15/2018 1830   KETONESUR NEGATIVE 03/15/2018 1830   PROTEINUR 100 (A) 03/15/2018 1830   UROBILINOGEN 0.2 03/05/2011 1909   NITRITE NEGATIVE 03/15/2018 1830   LEUKOCYTESUR NEGATIVE 03/15/2018 1830   Sepsis Labs: @LABRCNTIP (procalcitonin:4,lacticidven:4) ) Recent Results (from the past 240 hour(s))  Urine culture     Status: Abnormal   Collection Time: 03/15/18  6:30 PM  Result Value Ref Range Status   Specimen Description   Final    URINE, RANDOM Performed at [facility], [address] [place], [place] [address]    Special Requests   Final    NONE Performed at [facility], [address], [place], [place] [address]    Culture (A)  Final    <10,000 COLONIES/mL INSIGNIFICANT GROWTH Performed at [facility], [place] [address], [place], [place] [address]    Report Status 03/17/2018 FINAL  Final  Culture, blood (routine x 2)     Status: None (Preliminary result)   Collection Time: 03/15/18  8:00 PM  Result Value Ref Range Status   Specimen Description   Final    BLOOD RIGHT WRIST DRAWN BY RN Performed at [facility], [address], [place], [place] [address]    Special Requests   Final    BOTTLES DRAWN AEROBIC AND ANAEROBIC Blood Culture adequate volume Performed at [facility], [address] [place], [place] [address]    Culture  Setup Time   Final    GRAM POSITIVE COCCI ANAEROBIC BOTTLE Gram Stain Report Called to,Read Back By and Verified With: JOHNSON,[id number]  BY MATTHEWS, B 3.30.19 [facility]    Culture GRAM POSITIVE COCCI  Final   Report Status PENDING  Incomplete  Blood Culture ID Panel (Reflexed)     Status: Abnormal   Collection Time: 03/15/18  8:00 PM  Result Value Ref Range  Status   Enterococcus species NOT DETECTED NOT DETECTED Final   Listeria monocytogenes NOT DETECTED NOT DETECTED Final   Staphylococcus species DETECTED (A) NOT DETECTED Final    Comment: CRITICAL RESULT CALLED TO, READ BACK BY AND VERIFIED WITH: NATE HAYES PHARMD AT 0954 ON [id number] BY SJW    Staphylococcus aureus DETECTED (A) NOT DETECTED Final    Comment: CRITICAL RESULT CALLED TO, READ BACK BY AND VERIFIED WITH: NATE HAYES PHARMD AT 0954 ON [id number] BY SJW    Methicillin resistance NOT DETECTED NOT DETECTED Final   Streptococcus species NOT DETECTED NOT DETECTED Final   Streptococcus agalactiae NOT DETECTED NOT DETECTED Final   Streptococcus pneumoniae NOT DETECTED NOT DETECTED Final   Streptococcus pyogenes NOT DETECTED NOT DETECTED Final   Acinetobacter baumannii NOT DETECTED NOT DETECTED Final   Enterobacteriaceae species NOT DETECTED NOT DETECTED Final   Enterobacter cloacae complex NOT DETECTED NOT DETECTED Final   Escherichia coli NOT DETECTED NOT DETECTED Final   Klebsiella oxytoca NOT DETECTED NOT DETECTED Final   Klebsiella pneumoniae NOT DETECTED NOT DETECTED Final   Proteus species NOT DETECTED NOT DETECTED Final   Serratia marcescens NOT DETECTED NOT DETECTED Final   Haemophilus influenzae NOT DETECTED NOT DETECTED Final   Neisseria meningitidis NOT DETECTED NOT DETECTED Final   Pseudomonas aeruginosa NOT DETECTED NOT DETECTED Final   Candida albicans NOT DETECTED NOT DETECTED Final   Candida glabrata NOT DETECTED NOT DETECTED Final   Candida krusei NOT DETECTED NOT  DETECTED Final   Candida parapsilosis NOT DETECTED NOT DETECTED Final   Candida tropicalis NOT DETECTED NOT DETECTED Final    Comment: Performed at [facility], [address], [place], [place] [address]  Culture, blood (routine x 2)     Status: None (Preliminary result)   Collection Time: 03/15/18  8:21 PM  Result Value Ref Range Status   Specimen Description BLOOD LEFT HAND  Final   Special Requests   Final    BOTTLES DRAWN AEROBIC AND ANAEROBIC Blood Culture adequate volume   Culture   Final    NO GROWTH 4 DAYS Performed at [facility], [address], [place], [place] [address]    Report Status PENDING  Incomplete  Respiratory Panel by PCR     Status: None   Collection Time: 03/16/18 11:40 AM  Result Value Ref Range Status   Adenovirus NOT DETECTED NOT DETECTED Final   Coronavirus 229E NOT DETECTED NOT DETECTED Final   Coronavirus HKU1 NOT DETECTED NOT DETECTED Final   Coronavirus NL63 NOT DETECTED NOT DETECTED Final   Coronavirus OC43 NOT DETECTED NOT DETECTED Final   Metapneumovirus NOT DETECTED NOT DETECTED Final   Rhinovirus / Enterovirus NOT DETECTED NOT DETECTED Final   Influenza A NOT DETECTED NOT DETECTED Final   Influenza B NOT DETECTED NOT DETECTED Final   Parainfluenza Virus 1 NOT DETECTED NOT DETECTED Final   Parainfluenza Virus 2 NOT DETECTED NOT DETECTED Final   Parainfluenza Virus 3 NOT DETECTED NOT DETECTED Final   Parainfluenza Virus 4 NOT DETECTED NOT DETECTED Final   Respiratory Syncytial Virus NOT DETECTED NOT DETECTED Final   Bordetella pertussis NOT DETECTED NOT DETECTED Final   Chlamydophila pneumoniae NOT DETECTED NOT DETECTED Final   Mycoplasma pneumoniae NOT DETECTED NOT DETECTED Final    Comment: Performed at [facility], [place] [address], [place], [place] [address]  Culture, blood (routine x 2)     Status: None (Preliminary result)   Collection Time: 03/18/18 12:38 PM  Result Value Ref Range Status   Specimen Description LEFT  ANTECUBITAL  Final   Special Requests   Final    BOTTLES DRAWN AEROBIC AND ANAEROBIC Blood Culture adequate volume   Culture   Final    NO GROWTH < 24 HOURS Performed at [facility], [address], [place], [place] [address]    Report Status PENDING  Incomplete  Culture, blood (routine x 2)     Status: None (Preliminary result)   Collection Time: 03/18/18 12:45 PM  Result Value Ref Range Status   Specimen Description BLOOD LEFT HAND  Final   Special Requests   Final    BOTTLES DRAWN AEROBIC AND ANAEROBIC Blood Culture results may not be optimal due to an inadequate volume of blood received in culture bottles   Culture   Final    NO GROWTH < 24 HOURS Performed at [facility], [address], [place], [place] [address]    Report Status PENDING  Incomplete     Scheduled Meds: . amLODipine  10 mg Oral Daily  . aspirin EC  81 mg Oral Daily  . cycloSPORINE  1 drop Both Eyes BID  . docusate sodium  200 mg Oral BID  . donepezil  10 mg Oral QHS  . enoxaparin (LOVENOX) injection  40 mg Subcutaneous Q24H  . finasteride  5 mg Oral Daily  . gabapentin  600 mg Oral TID  . ipratropium-albuterol  3 mL Nebulization BID  . latanoprost  1 drop Both Eyes QHS  . memantine  10 mg Oral BID  . pantoprazole  40 mg Oral Daily  . potassium chloride  40 mEq Oral Q4H  . pravastatin  10 mg Oral QPM  . traZODone  100 mg Oral QHS  . venlafaxine XR  150 mg Oral BID  . vitamin B-12  1,000 mcg Oral Daily   Continuous Infusions: .  ceFAZolin (ANCEF) IV 2 g (03/19/18 0300)    Procedures/Studies: Ct Angio Chest Pe W And/or Wo Contrast  Result Date: 03/15/2018 CLINICAL DATA:  72 year old male with acute generalized abdominal pain and fever. EXAM: CT ANGIOGRAPHY CHEST CT ABDOMEN AND PELVIS WITH CONTRAST TECHNIQUE: Multidetector CT imaging of the chest was performed using the standard protocol during bolus administration of intravenous contrast. Multiplanar CT image reconstructions and MIPs were obtained to  evaluate the vascular anatomy. Multidetector CT imaging of the abdomen and pelvis was performed using the standard protocol during bolus administration of intravenous contrast. CONTRAST:  33mL ISOVUE-370 IOPAMIDOL (ISOVUE-370) INJECTION 76% COMPARISON:  Abdominal CT dated 03/15/2018 FINDINGS: CTA CHEST FINDINGS Cardiovascular: Top-normal cardiac size. No pericardial effusion. Mild atherosclerotic calcification of the thoracic aorta. No aneurysmal dilatation or evidence of dissection. The origins of the great vessels of the aortic arch are patent. Evaluation of the pulmonary arteries is limited due to suboptimal opacification and respiratory motion artifact. No large or central pulmonary artery embolus identified. Mediastinum/Nodes: There is no hilar or mediastinal adenopathy. Esophagus and the thyroid gland are grossly unremarkable. No mediastinal fluid collection. Lungs/Pleura: Bibasilar linear and streaky densities, likely atelectasis/scarring. There is a 4 mm left lower lobe nodule (series 6, image 87). There is no focal consolidation, pleural effusion, or pneumothorax. The central airways are patent. Musculoskeletal: There is degenerative changes of the spine. No acute fracture. Stimulator device extending into the upper thoracic spine. Review of the MIP images confirms the above findings. CT ABDOMEN and PELVIS FINDINGS No intra-abdominal free air or free fluid. Hepatobiliary: Apparent mild fatty infiltration of the liver. No  intrahepatic biliary ductal dilatation. The gallbladder is unremarkable. Pancreas: Unremarkable. No pancreatic ductal dilatation or surrounding inflammatory changes. Spleen: Normal in size without focal abnormality. Adrenals/Urinary Tract: The adrenal glands are unremarkable. Small nonobstructing bilateral renal calculi measure up to 5 mm in the upper pole of the left kidney. There is no hydronephrosis on either side. A 1 cm hypodense lesion from the superior pole of the right kidney  demonstrates fluid attenuation most consistent with a cyst. The visualized ureters and urinary bladder appear unremarkable. Stomach/Bowel: Small hiatal hernia. There is no bowel obstruction or active inflammation. Air is noted throughout the colon. The appendix is normal. Vascular/Lymphatic: Mild aortoiliac atherosclerotic disease. No portal venous gas. There is no adenopathy. Reproductive: The prostate and seminal vesicles are grossly unremarkable. Other: Small fat containing bilateral inguinal and umbilical hernias. No fluid collection or inflammatory changes. Musculoskeletal: There is osteopenia with multilevel degenerative changes. L1-L3 posterior fusion hardware and L4-L5 and L5-S1 interbody disc spacer. No acute osseous pathology. Review of the MIP images confirms the above findings. IMPRESSION: 1. No acute intrathoracic, abdominal, or pelvic pathology. 2. Mild fatty liver. 3. Small nonobstructing bilateral renal calculi.  No hydronephrosis. 4. Mild air distention of the colon. No bowel obstruction or active inflammation. Normal appendix. 5.  Aortic Atherosclerosis (ICD10-I70.0). 6. A 4 mm left lower lobe pulmonary nodule. No follow-up needed if patient is low-risk. Non-contrast chest CT can be considered in 12 months if patient is high-risk. This recommendation follows the consensus statement: Guidelines for Management of Incidental Pulmonary Nodules Detected on CT Images: From the Fleischner Society 2017; Radiology 2017; [phone]. 7. Electronically Signed   By: Anner Crete M.D.   On: 03/15/2018 22:25   Ct Abdomen Pelvis W Contrast  Result Date: 03/15/2018 CLINICAL DATA:  72 year old male with acute generalized abdominal pain and fever. EXAM: CT ANGIOGRAPHY CHEST CT ABDOMEN AND PELVIS WITH CONTRAST TECHNIQUE: Multidetector CT imaging of the chest was performed using the standard protocol during bolus administration of intravenous contrast. Multiplanar CT image reconstructions and MIPs were  obtained to evaluate the vascular anatomy. Multidetector CT imaging of the abdomen and pelvis was performed using the standard protocol during bolus administration of intravenous contrast. CONTRAST:  15mL ISOVUE-370 IOPAMIDOL (ISOVUE-370) INJECTION 76% COMPARISON:  Abdominal CT dated 03/15/2018 FINDINGS: CTA CHEST FINDINGS Cardiovascular: Top-normal cardiac size. No pericardial effusion. Mild atherosclerotic calcification of the thoracic aorta. No aneurysmal dilatation or evidence of dissection. The origins of the great vessels of the aortic arch are patent. Evaluation of the pulmonary arteries is limited due to suboptimal opacification and respiratory motion artifact. No large or central pulmonary artery embolus identified. Mediastinum/Nodes: There is no hilar or mediastinal adenopathy. Esophagus and the thyroid gland are grossly unremarkable. No mediastinal fluid collection. Lungs/Pleura: Bibasilar linear and streaky densities, likely atelectasis/scarring. There is a 4 mm left lower lobe nodule (series 6, image 87). There is no focal consolidation, pleural effusion, or pneumothorax. The central airways are patent. Musculoskeletal: There is degenerative changes of the spine. No acute fracture. Stimulator device extending into the upper thoracic spine. Review of the MIP images confirms the above findings. CT ABDOMEN and PELVIS FINDINGS No intra-abdominal free air or free fluid. Hepatobiliary: Apparent mild fatty infiltration of the liver. No intrahepatic biliary ductal dilatation. The gallbladder is unremarkable. Pancreas: Unremarkable. No pancreatic ductal dilatation or surrounding inflammatory changes. Spleen: Normal in size without focal abnormality. Adrenals/Urinary Tract: The adrenal glands are unremarkable. Small nonobstructing bilateral renal calculi measure up to 5 mm in the upper pole of  the left kidney. There is no hydronephrosis on either side. A 1 cm hypodense lesion from the superior pole of the right  kidney demonstrates fluid attenuation most consistent with a cyst. The visualized ureters and urinary bladder appear unremarkable. Stomach/Bowel: Small hiatal hernia. There is no bowel obstruction or active inflammation. Air is noted throughout the colon. The appendix is normal. Vascular/Lymphatic: Mild aortoiliac atherosclerotic disease. No portal venous gas. There is no adenopathy. Reproductive: The prostate and seminal vesicles are grossly unremarkable. Other: Small fat containing bilateral inguinal and umbilical hernias. No fluid collection or inflammatory changes. Musculoskeletal: There is osteopenia with multilevel degenerative changes. L1-L3 posterior fusion hardware and L4-L5 and L5-S1 interbody disc spacer. No acute osseous pathology. Review of the MIP images confirms the above findings. IMPRESSION: 1. No acute intrathoracic, abdominal, or pelvic pathology. 2. Mild fatty liver. 3. Small nonobstructing bilateral renal calculi.  No hydronephrosis. 4. Mild air distention of the colon. No bowel obstruction or active inflammation. Normal appendix. 5.  Aortic Atherosclerosis (ICD10-I70.0). 6. A 4 mm left lower lobe pulmonary nodule. No follow-up needed if patient is low-risk. Non-contrast chest CT can be considered in 12 months if patient is high-risk. This recommendation follows the consensus statement: Guidelines for Management of Incidental Pulmonary Nodules Detected on CT Images: From the Fleischner Society 2017; Radiology 2017; [phone]. 7. Electronically Signed   By: Anner Crete M.D.   On: 03/15/2018 22:25   Dg Chest Port 1 View  Result Date: 03/17/2018 CLINICAL DATA:  Respiratory distress.  History of prostate carcinoma EXAM: PORTABLE CHEST 1 VIEW COMPARISON:  Chest radiograph and chest CT March 15, 2018 FINDINGS: There is new airspace consolidation in the medial right base, suspicious for pneumonia. The lungs elsewhere are clear. There is cardiomegaly with pulmonary venous hypertension. There  is a small right pleural effusion. No adenopathy. There is a stimulator with lead tips at the cervical-thoracic junction. There is postoperative change in the lower cervical spine. IMPRESSION: Medial right base consolidation felt to represent pneumonia. Underlying pulmonary vascular congestion. Small right pleural effusion. Electronically Signed   By: Lowella Grip III M.D.   On: 03/17/2018 09:48   Dg Chest Portable 1 View  Result Date: 03/15/2018 CLINICAL DATA:  72 year old male status post fall getting into car today. Confusion beginning about 1630 hours. Shortness of breath. EXAM: PORTABLE CHEST 1 VIEW COMPARISON:  Portable chest 05/04/2016 and earlier. FINDINGS: Portable AP upright view at 1854 hours. Chronic cardiomegaly. Stable mediastinal contours. Visualized tracheal air column is within normal limits. Mild respiratory motion artifact. Suggestion of increased pulmonary vascularity compared to 2017. No pneumothorax, pleural effusion or consolidation is evident. Prior cervical spine surgery with spinal stimulator device coursing to the cervical region. No acute osseous abnormality identified. Negative visible bowel gas pattern. IMPRESSION: Cardiomegaly with appearance of increased pulmonary vascularity from prior studies. Consider acute pulmonary edema. Electronically Signed   By: Genevie Ann M.D.   On: 03/15/2018 19:09    Florencia Reasons, MD PhD [facility] Pager [phone]  If 7PM-7AM, please contact night-coverage [email or web address] Password [email or web address] 03/19/2018, 8:15 AM   LOS: 3 days

## 2018-03-19 NOTE — Progress Notes (Signed)
Patient ID: Cody Velez, male   DOB: 08-11-1946, 72 y.o.   MRN: [id number]          [facility]    Date of Admission:  03/15/2018   Total days of antibiotics 4        Day 2 cefazolin  It appears that he has defervesced seen on therapy for MSSA bacteremia.  Repeat blood cultures are negative at 24 hours.  TEE is planned for tomorrow.  I would continue cefazolin for now once repeat blood cultures are negative he can have a PICC placed.         Michel Bickers, MD [facility] [phone] pager   [phone] cell 03/19/2018, 10:20 AM

## 2018-03-20 ENCOUNTER — Telehealth: Payer: Self-pay | Admitting: Internal Medicine

## 2018-03-20 ENCOUNTER — Inpatient Hospital Stay (HOSPITAL_COMMUNITY): Payer: Medicare Other

## 2018-03-20 DIAGNOSIS — R7881 Bacteremia: Secondary | ICD-10-CM

## 2018-03-20 DIAGNOSIS — A4101 Sepsis due to Methicillin susceptible Staphylococcus aureus: Secondary | ICD-10-CM

## 2018-03-20 LAB — BASIC METABOLIC PANEL
Anion gap: 10 (ref 5–15)
BUN: 17 mg/dL (ref 6–20)
CO2: 29 mmol/L (ref 22–32)
Calcium: 8.5 mg/dL — ABNORMAL LOW (ref 8.9–10.3)
Chloride: 95 mmol/L — ABNORMAL LOW (ref 101–111)
Creatinine, Ser: 0.81 mg/dL (ref 0.61–1.24)
GFR calc Af Amer: >60 mL/min (ref >60)
GFR calc non Af Amer: >60 mL/min (ref >60)
Glucose, Bld: 92 mg/dL (ref 65–99)
Potassium: 3.3 mmol/L — ABNORMAL LOW (ref 3.5–5.1)
Sodium: 134 mmol/L — ABNORMAL LOW (ref 135–145)

## 2018-03-20 LAB — GASTROINTESTINAL PANEL BY PCR, STOOL (REPLACES STOOL CULTURE)

## 2018-03-20 LAB — HEPATIC FUNCTION PANEL
ALT: 46 U/L (ref 17–63)
AST: 65 U/L — ABNORMAL HIGH (ref 15–41)
Albumin: 2 g/dL — ABNORMAL LOW (ref 3.5–5.0)
Alkaline Phosphatase: 101 U/L (ref 38–126)
Bilirubin, Direct: 0.1 mg/dL (ref 0.1–0.5)
Indirect Bilirubin: 1 mg/dL — ABNORMAL HIGH (ref 0.3–0.9)
Total Bilirubin: 1.1 mg/dL (ref 0.3–1.2)
Total Protein: 6.9 g/dL (ref 6.5–8.1)

## 2018-03-20 LAB — CULTURE, BLOOD (ROUTINE X 2)
Culture: NO GROWTH
Special Requests: ADEQUATE
Special Requests: ADEQUATE

## 2018-03-20 LAB — CBC
HCT: 35.2 % — ABNORMAL LOW (ref 39.0–52.0)
Hemoglobin: 10.9 g/dL — ABNORMAL LOW (ref 13.0–17.0)
MCH: 25.8 pg — ABNORMAL LOW (ref 26.0–34.0)
MCHC: 31 g/dL (ref 30.0–36.0)
MCV: 83.4 fL (ref 78.0–100.0)
Platelets: 486 10*3/uL — ABNORMAL HIGH (ref 150–400)
RBC: 4.22 MIL/uL (ref 4.22–5.81)
RDW: 16.1 % — ABNORMAL HIGH (ref 11.5–15.5)
WBC: 14.5 10*3/uL — ABNORMAL HIGH (ref 4.0–10.5)

## 2018-03-20 LAB — AMMONIA: Ammonia: 32 umol/L (ref 9–35)

## 2018-03-20 LAB — MAGNESIUM: Magnesium: 2 mg/dL (ref 1.7–2.4)

## 2018-03-20 MED ORDER — IOPAMIDOL (ISOVUE-300) INJECTION 61%
100.0000 mL | Freq: Once | INTRAVENOUS | Status: AC | PRN
  Administered 2018-03-20: 100 mL via INTRAVENOUS

## 2018-03-20 NOTE — Progress Notes (Signed)
PROGRESS NOTE  Cody Velez QMG:[id number] DOB: Feb 06, 1946 DOA: 03/15/2018 PCP: Asencion Noble, MD  Brief History: 72 year old male with a history of hypertension, hyperlipidemia, morbid obesity, coronary artery disease, chronic back pain status post spinal stimulator(C-spine) placed 1/30/19presented with worsening confusion and a mechanical fall at home. The patient significant other contributes to the history at this time as he is not able to provide good history secondary to his encephalopathy. Apparently, the patient has had subjective fevers and chills for the better part of one month. In the past week, he has had worsening confusion. On 03/11/2018, the patient was returning from a trip from [facility]. Apparently he fell in the bathroom at [facility] and was not able to get up. He was admitted to [facility] 03/11/2018 and discharged on 03/13/2018. He was at his baseline at the time of discharge. However, he has had increasing confusion on 03/14/2018 and 03/15/2018. His significant other feels that the patient had been overusing his Percocet at home which may have been contributing to his falls and confusion. However, she has taken away his Percocets since his discharge from [facility], he has not had any in the 2 days prior to this admission. However, the patient has been increasing his use of ibuprofen for his pain. The patient denies any worsening headache, neck pain, nausea, vomiting, diarrhea, abdominal pain, dysuria, hematuria. He complains of left and right flank pain, left greater than right. In the emergency department, the patient had fever 1 2.4 F with tachycardia. He remained hemodynamically stable with oxygen saturation of 92% on room air. CT angiogram of the chest was negative for pulmonary embolus or dissection. There was no consolidation or pleural effusion. CT abdomen and pelvis was unremarkable for acute findings. There was nonobstructive bilateral renal  calculi. The patient was started on Zosyn, IV fluids, and admitted for further workup. The patient followed up with urology on the morning of 03/15/2018. There was concern for UTI. The patient was prescribed doxycycline of which she took 1 dose prior to going to the emergency department.  Assessment/Plan: Sepsis/MSSA bacteremia/ PNA -Source likely PNA, less likely spinal cord stimulator/battery pack ( CT no signs of infection) -he was started on  Zosyn/Vanc on admission,  -ID input noted-->cefazolin -UA 6-30 WBC, urine culture insignificant growth,  -Viral respiratory panel--neg -Lactic acid coming down from 1.8-1.1 , - procalcitonin--2.15>>>1.28, now down to 0.77 -d/c IV fluids -spoke with pt's pain management specialist-->stimulator is MRI compatible using 1.5T magnet -spoke with neuroradiology x 2 (Dr. Jeralyn Ruths and Dr. Jon Gills able to perform MR of spine due to parameters of spinal cord stimulator -TEE rescheduled for Tuesday -PICC line once 3/30 blood cultures remain neg x 5 days  Acute kidney injury -Baseline creatinine 1.0-1.2 -Presenting creatinine 2.09 -Secondary to sepsis and volume depletionin setting of NSAID use. Urine culture insignificant growth -resolved  Leg weakness -likely deconditioning -cannot obtain MRI of spine due to spinal cord stimulator -CT Thoracic and lumbar spine  Acute respiratory failure with hypoxia -due to PNA -03/17/18--personally reviewed CXR--increase interstitial markings, RLL opacity -stable on 2L -d/c ivf, prn lasix  Essential hypertension -Holding losartan in the setting of AKI -Continue amlodipine  Acute metabolic encephalopathy -Secondary to sepsis -improved, back to baseline -Significant other states that he has not used his Percocet in the past 48 hours prior to admission -Per family patient was confused at [place] when he was hospitalized, CT head there are no acute findings -CT  brain --neg  Myoclonus -Likely  secondary to the patient's infectious process and gabapentin setting of renal failure -EEG--normal -Reduced gabapentin dose  Cognitive impairment -Continue Aricept and Namenda  Chronic pain syndrome -Minimize hypnotic and opioids -Continue gabapentin--decrease dose for renal failure  Depression -Continue home dose of venlafaxine -Holding trazodone in the setting of encephalopathy  Hyperlipidemia -Continue statin  Coronary artery disease -No chest pain presently -Personally reviewed EKG--sinus rhythm, no ST-T wave changes -Personally reviewed chest x-ray-increased interstitial markings without consolidation  Hypokalemia -repleted -check mag--2.0  Transaminasemia -due to sepsis -no RUQ pain -am CMP  Body mass index is 38.25 kg/m.  Disposition Plan: Home in [place] other and daughter updatedat bedside4/1  Consultants:none  Code Status: FULL  DVT Prophylaxis:  Lovenox   Procedures: As Listed in Progress Note Above  Antibiotics: Zosyn 3/27>>>3/30 vanco 4/27>>>3/30 Cefazolin 3/30>>>     Subjective: Patient is more lucid today.  Complains of some lower back pain which is not any worse than usual.  He denies any headache, chest pain, shortness breath, nausea, vomiting, diarrhea, abdominal pain.  There is no dysuria hematuria.  Objective: Vitals:   03/19/18 2047 03/19/18 2122 03/20/18 0500 03/20/18 0635  BP:  135/80  129/79  Pulse:  90  95  Resp: 18 18  18   Temp:  98 F (36.7 C)  98 F (36.7 C)  TempSrc:  Axillary  Oral  SpO2: 93% 95%  95%  Weight:   (!) 138 kg (304 lb 3.8 oz)   Height:        Intake/Output Summary (Last 24 hours) at 03/20/2018 7425 Last data filed at 03/20/2018 0900 Gross per 24 hour  Intake 1100 ml  Output 1200 ml  Net -100 ml   Weight change: -1.9 kg (-4 lb 3 oz) Exam:   General:  Pt is alert, follows commands appropriately, not in acute distress  HEENT: No  icterus, No thrush, No neck mass, [place]/AT  Cardiovascular: RRR, S1/S2, no rubs, no gallops  Respiratory: Fine bibasilar crackles but no wheezing.  Good air movement.  Abdomen: Soft/+BS, non tender, non distended, no guarding  Extremities: No edema, No lymphangitis, No petechiae, No rashes, no synovitis  Neuro--no cord level on sensation; strength 3-/5 bilateral LE   Data Reviewed: I have personally reviewed following labs and imaging studies Basic Metabolic Panel: Recent Labs  Lab 03/15/18 1859 03/16/18 0429 03/16/18 0826 03/17/18 0609 03/19/18 0642 03/20/18 0703  NA 137 137  --  135 132* 134*  K 3.7 3.3*  --  3.8 2.9* 3.3*  CL 101 101  --  99* 92* 95*  CO2 21* 23  --  25 28 29   GLUCOSE 158* 131*  --  108* 116* 92  BUN 27* 25*  --  20 16 17   CREATININE 2.09* 1.53*  --  1.02 0.77 0.81  CALCIUM 9.2 8.6*  --  8.4* 8.6* 8.5*  MG  --   --  2.0 2.2 1.9 2.0   Liver Function Tests: Recent Labs  Lab 03/15/18 1859 03/17/18 0609 03/19/18 0642 03/20/18 0703  AST 50* 47* 70* 65*  ALT 55 41 51 46  ALKPHOS 118 109 115 101  BILITOT 1.4* 0.9 1.1 1.1  PROT 7.7 6.7 6.8 6.9  ALBUMIN 2.7* 2.3* 2.0* 2.0*   No results for input(s): LIPASE, AMYLASE in the last 168 hours. Recent Labs  Lab 03/19/18 0642 03/20/18 0703  AMMONIA 36* 32   Coagulation Profile: No results for input(s): INR, PROTIME in the  last 168 hours. CBC: Recent Labs  Lab 03/15/18 1859 03/16/18 0429 03/17/18 0609 03/19/18 0642 03/20/18 0703  WBC 25.7* 22.9* 18.2* 17.6* 14.5*  NEUTROABS 24.2*  --   --   --   --   HGB 11.4* 10.7* 11.0* 11.5* 10.9*  HCT 35.3* 33.7* 34.7* 35.9* 35.2*  MCV 83.6 83.4 84.4 81.0 83.4  PLT 435* 428* 410* 481* 486*   Cardiac Enzymes: Recent Labs  Lab 03/15/18 1859 03/16/18 0826 03/17/18 0609  CKTOTAL  --  438* 113  TROPONINI <0.03  --   --    BNP: Invalid input(s): POCBNP CBG: No results for input(s): GLUCAP in the last 168 hours. HbA1C: No results for input(s): HGBA1C  in the last 72 hours. Urine analysis:    Component Value Date/Time   COLORURINE AMBER (A) 03/15/2018 1830   APPEARANCEUR CLOUDY (A) 03/15/2018 1830   LABSPEC 1.029 03/15/2018 1830   PHURINE 5.0 03/15/2018 1830   GLUCOSEU 50 (A) 03/15/2018 1830   HGBUR MODERATE (A) 03/15/2018 1830   BILIRUBINUR MODERATE (A) 03/15/2018 1830   KETONESUR NEGATIVE 03/15/2018 1830   PROTEINUR 100 (A) 03/15/2018 1830   UROBILINOGEN 0.2 03/05/2011 1909   NITRITE NEGATIVE 03/15/2018 1830   LEUKOCYTESUR NEGATIVE 03/15/2018 1830   Sepsis Labs: @LABRCNTIP (procalcitonin:4,lacticidven:4) ) Recent Results (from the past 240 hour(s))  Urine culture     Status: Abnormal   Collection Time: 03/15/18  6:30 PM  Result Value Ref Range Status   Specimen Description   Final    URINE, RANDOM Performed at [facility], [address], [place], [place] [address]    Special Requests   Final    NONE Performed at [facility], [address], [place], [place] [address]    Culture (A)  Final    <10,000 COLONIES/mL INSIGNIFICANT GROWTH Performed at [facility], [place] [address], [place], [place] [address]    Report Status 03/17/2018 FINAL  Final  Culture, blood (routine x 2)     Status: Abnormal   Collection Time: 03/15/18  8:00 PM  Result Value Ref Range Status   Specimen Description   Final    BLOOD RIGHT WRIST Performed at [facility], [place] [address] [place], [place] [address]    Special Requests   Final    BOTTLES DRAWN AEROBIC AND ANAEROBIC Blood Culture adequate volume Performed at [facility], [address] [place], [place] [address]    Culture  Setup Time   Final    GRAM POSITIVE COCCI IN BOTH AEROBIC AND ANAEROBIC BOTTLES Performed at [facility] Gram Stain Report Called to,Read Back By and Verified With: JOHNSON,B@[id number]  BY MATTHEWS, B 3.30.19 CRITICAL RESULT CALLED TO, READ BACK BY AND VERIFIED WITH: NATE HAYES,PHARMD [id number] BY SJW Performed at [facility], [place]  [address], [place], [place] [address]    Culture STAPHYLOCOCCUS AUREUS (A)  Final   Report Status 03/20/2018 FINAL  Final   Organism ID, Bacteria STAPHYLOCOCCUS AUREUS  Final      Susceptibility   Staphylococcus aureus - MIC*    CIPROFLOXACIN <=0.5 SENSITIVE Sensitive     ERYTHROMYCIN 0.5 SENSITIVE Sensitive     GENTAMICIN <=0.5 SENSITIVE Sensitive     OXACILLIN <=0.25 SENSITIVE Sensitive     TETRACYCLINE <=1 SENSITIVE Sensitive     VANCOMYCIN <=0.5 SENSITIVE Sensitive     TRIMETH/SULFA <=10 SENSITIVE Sensitive     CLINDAMYCIN <=0.25 SENSITIVE Sensitive     RIFAMPIN <=0.5 SENSITIVE Sensitive     Inducible  Clindamycin NEGATIVE Sensitive     * STAPHYLOCOCCUS AUREUS  Blood Culture ID Panel (Reflexed)     Status: Abnormal   Collection Time: 03/15/18  8:00 PM  Result Value Ref Range Status   Enterococcus species NOT DETECTED NOT DETECTED Final   Listeria monocytogenes NOT DETECTED NOT DETECTED Final   Staphylococcus species DETECTED (A) NOT DETECTED Final    Comment: CRITICAL RESULT CALLED TO, READ BACK BY AND VERIFIED WITH: NATE HAYES PHARMD AT 0954 ON [id number] BY SJW    Staphylococcus aureus DETECTED (A) NOT DETECTED Final    Comment: CRITICAL RESULT CALLED TO, READ BACK BY AND VERIFIED WITH: NATE HAYES PHARMD AT 0954 ON [id number] BY SJW    Methicillin resistance NOT DETECTED NOT DETECTED Final   Streptococcus species NOT DETECTED NOT DETECTED Final   Streptococcus agalactiae NOT DETECTED NOT DETECTED Final   Streptococcus pneumoniae NOT DETECTED NOT DETECTED Final   Streptococcus pyogenes NOT DETECTED NOT DETECTED Final   Acinetobacter baumannii NOT DETECTED NOT DETECTED Final   Enterobacteriaceae species NOT DETECTED NOT DETECTED Final   Enterobacter cloacae complex NOT DETECTED NOT DETECTED Final   Escherichia coli NOT DETECTED NOT DETECTED Final   Klebsiella oxytoca NOT DETECTED NOT DETECTED Final   Klebsiella pneumoniae NOT DETECTED NOT DETECTED Final   Proteus species NOT DETECTED NOT  DETECTED Final   Serratia marcescens NOT DETECTED NOT DETECTED Final   Haemophilus influenzae NOT DETECTED NOT DETECTED Final   Neisseria meningitidis NOT DETECTED NOT DETECTED Final   Pseudomonas aeruginosa NOT DETECTED NOT DETECTED Final   Candida albicans NOT DETECTED NOT DETECTED Final   Candida glabrata NOT DETECTED NOT DETECTED Final   Candida krusei NOT DETECTED NOT DETECTED Final   Candida parapsilosis NOT DETECTED NOT DETECTED Final   Candida tropicalis NOT DETECTED NOT DETECTED Final    Comment: Performed at [facility], [address] [place], [place] [address]  Culture, blood (routine x 2)     Status: None   Collection Time: 03/15/18  8:21 PM  Result Value Ref Range Status   Specimen Description BLOOD LEFT HAND  Final   Special Requests   Final    BOTTLES DRAWN AEROBIC AND ANAEROBIC Blood Culture adequate volume   Culture   Final    NO GROWTH 5 DAYS Performed at [facility], [address], [place], [place] [address]    Report Status 03/20/2018 FINAL  Final  Respiratory Panel by PCR     Status: None   Collection Time: 03/16/18 11:40 AM  Result Value Ref Range Status   Adenovirus NOT DETECTED NOT DETECTED Final   Coronavirus 229E NOT DETECTED NOT DETECTED Final   Coronavirus HKU1 NOT DETECTED NOT DETECTED Final   Coronavirus NL63 NOT DETECTED NOT DETECTED Final   Coronavirus OC43 NOT DETECTED NOT DETECTED Final   Metapneumovirus NOT DETECTED NOT DETECTED Final   Rhinovirus / Enterovirus NOT DETECTED NOT DETECTED Final   Influenza A NOT DETECTED NOT DETECTED Final   Influenza B NOT DETECTED NOT DETECTED Final   Parainfluenza Virus 1 NOT DETECTED NOT DETECTED Final   Parainfluenza Virus 2 NOT DETECTED NOT DETECTED Final   Parainfluenza Virus 3 NOT DETECTED NOT DETECTED Final   Parainfluenza Virus 4 NOT DETECTED NOT DETECTED Final   Respiratory Syncytial Virus NOT DETECTED NOT DETECTED Final   Bordetella pertussis NOT DETECTED NOT DETECTED Final   Chlamydophila  pneumoniae NOT DETECTED NOT DETECTED Final   Mycoplasma pneumoniae NOT DETECTED NOT DETECTED Final  Comment: Performed at [facility], [place] [address] [place], [place] [address]  Culture, blood (routine x 2)     Status: None (Preliminary result)   Collection Time: 03/18/18 12:38 PM  Result Value Ref Range Status   Specimen Description LEFT ANTECUBITAL  Final   Special Requests   Final    BOTTLES DRAWN AEROBIC AND ANAEROBIC Blood Culture adequate volume   Culture   Final    NO GROWTH 2 DAYS Performed at [facility], [address] [place], [place] [address]    Report Status PENDING  Incomplete  Culture, blood (routine x 2)     Status: None (Preliminary result)   Collection Time: 03/18/18 12:45 PM  Result Value Ref Range Status   Specimen Description BLOOD LEFT HAND  Final   Special Requests   Final    BOTTLES DRAWN AEROBIC AND ANAEROBIC Blood Culture results may not be optimal due to an inadequate volume of blood received in culture bottles   Culture   Final    NO GROWTH 2 DAYS Performed at [facility], [address], [place], [place] [address]    Report Status PENDING  Incomplete  C difficile quick scan w PCR reflex     Status: None   Collection Time: 03/19/18  5:04 PM  Result Value Ref Range Status   C Diff antigen NEGATIVE NEGATIVE Final   C Diff toxin NEGATIVE NEGATIVE Final   C Diff interpretation No C. difficile detected.  Final    Comment: VALID Performed at [facility], [address] [place], [place] [address]      Scheduled Meds: . amLODipine  10 mg Oral Daily  . aspirin EC  81 mg Oral Daily  . cycloSPORINE  1 drop Both Eyes BID  . donepezil  10 mg Oral QHS  . enoxaparin (LOVENOX) injection  40 mg Subcutaneous Q24H  . finasteride  5 mg Oral Daily  . gabapentin  600 mg Oral TID  . ipratropium-albuterol  3 mL Nebulization BID  . latanoprost  1 drop Both Eyes QHS  . memantine  10 mg Oral BID  . pantoprazole  40 mg Oral Daily  . pravastatin  10 mg Oral  QPM  . saccharomyces boulardii  250 mg Oral BID WC  . traZODone  100 mg Oral QHS  . venlafaxine XR  150 mg Oral BID  . vitamin B-12  1,000 mcg Oral Daily   Continuous Infusions: .  ceFAZolin (ANCEF) IV Stopped (03/20/18 0248)    Procedures/Studies: Dg Abd 1 View  Result Date: 03/19/2018 CLINICAL DATA:  Abdominal distension. EXAM: ABDOMEN - 1 VIEW COMPARISON:  None. FINDINGS: Nonspecific gaseous distension of the colon. There is no bowel dilatation to suggest obstruction. There is no evidence of pneumoperitoneum, portal venous gas or pneumatosis. There are no pathologic calcifications along the expected course of the ureters. There is posterior lumbar interbody fusion from L1 through L3. There is interbody fusion at L3-4 and L4-5. IMPRESSION: Negative. Electronically Signed   By: Kathreen Devoid   On: 03/19/2018 16:39   Ct Head Wo Contrast  Result Date: 03/19/2018 CLINICAL DATA:  Altered level of consciousness. Fall on 03/11/2018 with increasing confusion. EXAM: CT HEAD WITHOUT CONTRAST TECHNIQUE: Contiguous axial images were obtained from the base of the skull through the vertex without intravenous contrast. COMPARISON:  Outside brain MRI 05/02/2015 FINDINGS: Brain: There is no evidence of acute large territory infarct, intracranial hemorrhage, mass, midline shift, or extra-axial fluid collection. Mild lateral, third, and  fourth ventriculomegaly is unchanged and may reflect cerebral atrophy or normal pressure hydrocephalus in the appropriate clinical setting. The right lateral ventricle remains slightly asymmetrically dilated compared to the left, most notably in the temporal horn. Patchy to confluent hypodensities throughout the cerebral white matter correspond to extensive T2 hyperintensities on MRI and are nonspecific but compatible with chronic small vessel ischemic disease. There is mild patchy hypoattenuation in the pons. Vascular: Mild calcified atherosclerosis at the skull base. No hyperdense  vessel. Skull: No fracture or focal osseous lesion. Sinuses/Orbits: Prior functional sinus surgery. No acute sinus findings. Clear mastoid air cells. Unremarkable orbits. Other: None. IMPRESSION: 1. No evidence of acute intracranial abnormality. 2. Extensive chronic small vessel ischemic disease. 3. Unchanged mild chronic ventriculomegaly as above. Electronically Signed   By: Logan Bores M.D.   On: 03/19/2018 15:38   Ct Angio Chest Pe W And/or Wo Contrast  Result Date: 03/15/2018 CLINICAL DATA:  72 year old male with acute generalized abdominal pain and fever. EXAM: CT ANGIOGRAPHY CHEST CT ABDOMEN AND PELVIS WITH CONTRAST TECHNIQUE: Multidetector CT imaging of the chest was performed using the standard protocol during bolus administration of intravenous contrast. Multiplanar CT image reconstructions and MIPs were obtained to evaluate the vascular anatomy. Multidetector CT imaging of the abdomen and pelvis was performed using the standard protocol during bolus administration of intravenous contrast. CONTRAST:  30mL ISOVUE-370 IOPAMIDOL (ISOVUE-370) INJECTION 76% COMPARISON:  Abdominal CT dated 03/15/2018 FINDINGS: CTA CHEST FINDINGS Cardiovascular: Top-normal cardiac size. No pericardial effusion. Mild atherosclerotic calcification of the thoracic aorta. No aneurysmal dilatation or evidence of dissection. The origins of the great vessels of the aortic arch are patent. Evaluation of the pulmonary arteries is limited due to suboptimal opacification and respiratory motion artifact. No large or central pulmonary artery embolus identified. Mediastinum/Nodes: There is no hilar or mediastinal adenopathy. Esophagus and the thyroid gland are grossly unremarkable. No mediastinal fluid collection. Lungs/Pleura: Bibasilar linear and streaky densities, likely atelectasis/scarring. There is a 4 mm left lower lobe nodule (series 6, image 87). There is no focal consolidation, pleural effusion, or pneumothorax. The central  airways are patent. Musculoskeletal: There is degenerative changes of the spine. No acute fracture. Stimulator device extending into the upper thoracic spine. Review of the MIP images confirms the above findings. CT ABDOMEN and PELVIS FINDINGS No intra-abdominal free air or free fluid. Hepatobiliary: Apparent mild fatty infiltration of the liver. No intrahepatic biliary ductal dilatation. The gallbladder is unremarkable. Pancreas: Unremarkable. No pancreatic ductal dilatation or surrounding inflammatory changes. Spleen: Normal in size without focal abnormality. Adrenals/Urinary Tract: The adrenal glands are unremarkable. Small nonobstructing bilateral renal calculi measure up to 5 mm in the upper pole of the left kidney. There is no hydronephrosis on either side. A 1 cm hypodense lesion from the superior pole of the right kidney demonstrates fluid attenuation most consistent with a cyst. The visualized ureters and urinary bladder appear unremarkable. Stomach/Bowel: Small hiatal hernia. There is no bowel obstruction or active inflammation. Air is noted throughout the colon. The appendix is normal. Vascular/Lymphatic: Mild aortoiliac atherosclerotic disease. No portal venous gas. There is no adenopathy. Reproductive: The prostate and seminal vesicles are grossly unremarkable. Other: Small fat containing bilateral inguinal and umbilical hernias. No fluid collection or inflammatory changes. Musculoskeletal: There is osteopenia with multilevel degenerative changes. L1-L3 posterior fusion hardware and L4-L5 and L5-S1 interbody disc spacer. No acute osseous pathology. Review of the MIP images confirms the above findings. IMPRESSION: 1. No acute intrathoracic, abdominal, or pelvic pathology. 2. Mild fatty liver.  3. Small nonobstructing bilateral renal calculi.  No hydronephrosis. 4. Mild air distention of the colon. No bowel obstruction or active inflammation. Normal appendix. 5.  Aortic Atherosclerosis (ICD10-I70.0). 6. A  4 mm left lower lobe pulmonary nodule. No follow-up needed if patient is low-risk. Non-contrast chest CT can be considered in 12 months if patient is high-risk. This recommendation follows the consensus statement: Guidelines for Management of Incidental Pulmonary Nodules Detected on CT Images: From the Fleischner Society 2017; Radiology 2017; [phone]. 7. Electronically Signed   By: Anner Crete M.D.   On: 03/15/2018 22:25   Ct Abdomen Pelvis W Contrast  Result Date: 03/15/2018 CLINICAL DATA:  72 year old male with acute generalized abdominal pain and fever. EXAM: CT ANGIOGRAPHY CHEST CT ABDOMEN AND PELVIS WITH CONTRAST TECHNIQUE: Multidetector CT imaging of the chest was performed using the standard protocol during bolus administration of intravenous contrast. Multiplanar CT image reconstructions and MIPs were obtained to evaluate the vascular anatomy. Multidetector CT imaging of the abdomen and pelvis was performed using the standard protocol during bolus administration of intravenous contrast. CONTRAST:  56mL ISOVUE-370 IOPAMIDOL (ISOVUE-370) INJECTION 76% COMPARISON:  Abdominal CT dated 03/15/2018 FINDINGS: CTA CHEST FINDINGS Cardiovascular: Top-normal cardiac size. No pericardial effusion. Mild atherosclerotic calcification of the thoracic aorta. No aneurysmal dilatation or evidence of dissection. The origins of the great vessels of the aortic arch are patent. Evaluation of the pulmonary arteries is limited due to suboptimal opacification and respiratory motion artifact. No large or central pulmonary artery embolus identified. Mediastinum/Nodes: There is no hilar or mediastinal adenopathy. Esophagus and the thyroid gland are grossly unremarkable. No mediastinal fluid collection. Lungs/Pleura: Bibasilar linear and streaky densities, likely atelectasis/scarring. There is a 4 mm left lower lobe nodule (series 6, image 87). There is no focal consolidation, pleural effusion, or pneumothorax. The central  airways are patent. Musculoskeletal: There is degenerative changes of the spine. No acute fracture. Stimulator device extending into the upper thoracic spine. Review of the MIP images confirms the above findings. CT ABDOMEN and PELVIS FINDINGS No intra-abdominal free air or free fluid. Hepatobiliary: Apparent mild fatty infiltration of the liver. No intrahepatic biliary ductal dilatation. The gallbladder is unremarkable. Pancreas: Unremarkable. No pancreatic ductal dilatation or surrounding inflammatory changes. Spleen: Normal in size without focal abnormality. Adrenals/Urinary Tract: The adrenal glands are unremarkable. Small nonobstructing bilateral renal calculi measure up to 5 mm in the upper pole of the left kidney. There is no hydronephrosis on either side. A 1 cm hypodense lesion from the superior pole of the right kidney demonstrates fluid attenuation most consistent with a cyst. The visualized ureters and urinary bladder appear unremarkable. Stomach/Bowel: Small hiatal hernia. There is no bowel obstruction or active inflammation. Air is noted throughout the colon. The appendix is normal. Vascular/Lymphatic: Mild aortoiliac atherosclerotic disease. No portal venous gas. There is no adenopathy. Reproductive: The prostate and seminal vesicles are grossly unremarkable. Other: Small fat containing bilateral inguinal and umbilical hernias. No fluid collection or inflammatory changes. Musculoskeletal: There is osteopenia with multilevel degenerative changes. L1-L3 posterior fusion hardware and L4-L5 and L5-S1 interbody disc spacer. No acute osseous pathology. Review of the MIP images confirms the above findings. IMPRESSION: 1. No acute intrathoracic, abdominal, or pelvic pathology. 2. Mild fatty liver. 3. Small nonobstructing bilateral renal calculi.  No hydronephrosis. 4. Mild air distention of the colon. No bowel obstruction or active inflammation. Normal appendix. 5.  Aortic Atherosclerosis (ICD10-I70.0). 6. A  4 mm left lower lobe pulmonary nodule. No follow-up needed if patient is low-risk. Non-contrast  chest CT can be considered in 12 months if patient is high-risk. This recommendation follows the consensus statement: Guidelines for Management of Incidental Pulmonary Nodules Detected on CT Images: From the Fleischner Society 2017; Radiology 2017; [phone]. 7. Electronically Signed   By: Anner Crete M.D.   On: 03/15/2018 22:25   Dg Chest Port 1 View  Result Date: 03/17/2018 CLINICAL DATA:  Respiratory distress.  History of prostate carcinoma EXAM: PORTABLE CHEST 1 VIEW COMPARISON:  Chest radiograph and chest CT March 15, 2018 FINDINGS: There is new airspace consolidation in the medial right base, suspicious for pneumonia. The lungs elsewhere are clear. There is cardiomegaly with pulmonary venous hypertension. There is a small right pleural effusion. No adenopathy. There is a stimulator with lead tips at the cervical-thoracic junction. There is postoperative change in the lower cervical spine. IMPRESSION: Medial right base consolidation felt to represent pneumonia. Underlying pulmonary vascular congestion. Small right pleural effusion. Electronically Signed   By: Lowella Grip III M.D.   On: 03/17/2018 09:48   Dg Chest Portable 1 View  Result Date: 03/15/2018 CLINICAL DATA:  72 year old male status post fall getting into car today. Confusion beginning about 1630 hours. Shortness of breath. EXAM: PORTABLE CHEST 1 VIEW COMPARISON:  Portable chest 05/04/2016 and earlier. FINDINGS: Portable AP upright view at 1854 hours. Chronic cardiomegaly. Stable mediastinal contours. Visualized tracheal air column is within normal limits. Mild respiratory motion artifact. Suggestion of increased pulmonary vascularity compared to 2017. No pneumothorax, pleural effusion or consolidation is evident. Prior cervical spine surgery with spinal stimulator device coursing to the cervical region. No acute osseous abnormality  identified. Negative visible bowel gas pattern. IMPRESSION: Cardiomegaly with appearance of increased pulmonary vascularity from prior studies. Consider acute pulmonary edema. Electronically Signed   By: Genevie Ann M.D.   On: 03/15/2018 19:09    Orson Eva, DO  [facility] Pager [phone]  If 7PM-7AM, please contact night-coverage [email or web address] Password [email or web address] 03/20/2018, 9:52 AM   LOS: 4 days

## 2018-03-20 NOTE — Consult Note (Addendum)
     Consult placed today for TEE due to MSSA bacteremia. Patient not NPO - just ate breakfast. Will try to arrange for tomorrow with Anesthesia to monitor and sedate.  Patient has history of chronic pain with spinal stimulator in place.  He is on Neurontin, ibuprofen, and oxycodone as an outpatient.  Vital signs stable, afebrile with systolics in the 403-709 range, heart rate in the 90s, weight 304 pounds.  Lab work shows mild leukocytosis at 14.5, hemoglobin 10.9, platelets 486, creatinine 0.81.  I personally reviewed the tracing from 03/15/2018 which showed sinus tachycardia with left anterior fascicular block.  [name], M.D., F.A.C.C.

## 2018-03-20 NOTE — Progress Notes (Signed)
Physical Therapy Treatment Patient Details Name: Cody Velez MRN: [id number] DOB: 11-22-1946 Today's Date: 03/20/2018    History of Present Illness Cody Velez is a 72 y.o. male with medical history significant for chronic back pain with recent spinal cord stimulator placement several weeks ago, nephrolithiasis with recent admission for UTI, hypertension, dyslipidemia, morbid obesity, and CAD who was brought to the emergency department by family members due to worsening generalized weakness that led to a fall at home.  He actually saw urology earlier today as a follow-up appointment after recent admission to [facility] in [place] where he was noted to have some bilateral nephrolithiasis.  He was apparently prescribed some doxycycline during his urology visit today, likely due to suspicion of urinary tract infection.  He has had progressive confusion throughout the day and was noted to be hypoxemic upon arrival as well as hypoxemic during his urology visit earlier.    PT Comments    Patient very weak with difficulty maintaining sitting balance having to support himself with BUE, otherwise lean backwards or nearly loses balance leaning forward, stood for up to 10 seconds before having to sit due to BLE weakness, left weaker than right, required active assistance to lift LLE against gravity while completing exercises, unable to take steps or transfer to chair due to weakness.  Patient will benefit from continued physical therapy in hospital and recommended venue below to increase strength, balance, endurance for safe ADLs and gait.    Follow Up Recommendations  SNF     Equipment Recommendations  None recommended by PT    Recommendations for Other Services       Precautions / Restrictions Precautions Precautions: Fall Restrictions Weight Bearing Restrictions: No    Mobility  Bed Mobility Overal bed mobility: Needs Assistance Bed Mobility: Rolling;Supine to Sit;Sit to Supine Rolling:  Min assist   Supine to sit: Mod assist Sit to supine: Mod assist      Transfers Overall transfer level: Needs assistance Equipment used: Rolling walker (2 wheeled) Transfers: Sit to/from Stand Sit to Stand: Max assist            Ambulation/Gait                 Stairs            Wheelchair Mobility    Modified Rankin (Stroke Patients Only)       Balance Overall balance assessment: Needs assistance Sitting-balance support: Feet supported;Bilateral upper extremity supported Sitting balance-Leahy Scale: Fair     Standing balance support: Bilateral upper extremity supported;During functional activity Standing balance-Leahy Scale: Poor                              Cognition Arousal/Alertness: Awake/alert Behavior During Therapy: WFL for tasks assessed/performed Overall Cognitive Status: Within Functional Limits for tasks assessed                                        Exercises General Exercises - Lower Extremity Ankle Circles/Pumps: Supine;AROM;Strengthening;Both;10 reps Long Arc Quad: Seated;AROM;Strengthening;Both;10 reps Hip Flexion/Marching: Seated;AROM;Strengthening;Both;5 reps Heel Raises: Seated;Strengthening;AROM;Both;10 reps    General Comments        Pertinent Vitals/Pain Pain Assessment: Faces Faces Pain Scale: Hurts little more Pain Location: low back Pain Descriptors / Indicators: Sharp;Aching;Grimacing;Guarding Pain Intervention(s): Limited activity within patient's tolerance;Monitored during session    Home Living  Prior Function            PT Goals (current goals can now be found in the care plan section) Acute Rehab PT Goals Patient Stated Goal: return home PT Goal Formulation: With patient Time For Goal Achievement: 03/30/18 Potential to Achieve Goals: Good Progress towards PT goals: Progressing toward goals    Frequency    Min 3X/week      PT Plan  Current plan remains appropriate    Co-evaluation              AM-PAC PT "6 Clicks" Daily Activity  Outcome Measure  Difficulty turning over in bed (including adjusting bedclothes, sheets and blankets)?: A Little Difficulty moving from lying on back to sitting on the side of the bed? : A Lot Difficulty sitting down on and standing up from a chair with arms (e.g., wheelchair, bedside commode, etc,.)?: A Lot Help needed moving to and from a bed to chair (including a wheelchair)?: Total Help needed walking in hospital room?: Total Help needed climbing 3-5 steps with a railing? : Total 6 Click Score: 10    End of Session Equipment Utilized During Treatment: Oxygen;Gait belt Activity Tolerance: Patient tolerated treatment well;Patient limited by fatigue Patient left: in bed;with call bell/phone within reach Nurse Communication: Mobility status PT Visit Diagnosis: Unsteadiness on feet (R26.81);Other abnormalities of gait and mobility (R26.89);Muscle weakness (generalized) (M62.81)     Time: 3557-3220 PT Time Calculation (min) (ACUTE ONLY): 29 min  Charges:  $Therapeutic Exercise: 8-22 mins $Therapeutic Activity: 8-22 mins                    G Codes:       4:07 PM, April 09, 2018 Lonell Grandchild, MPT Physical Therapist with [facility] [phone] office [phone] mobile phone

## 2018-03-20 NOTE — Clinical Social Work Note (Signed)
LCSW following. Per MD, pt will remain hospitalized for a couple more days. He will need PICC and IV anbx at dc. Pt was previously offered a bed at [facility] prior to them knowing about IV antibiotics. Contacted Kerri at [facility] to update. Once the antibiotic is known, will update Marianna Fuss again and determine if they can still accept pt.   Will follow.

## 2018-03-21 ENCOUNTER — Inpatient Hospital Stay (HOSPITAL_COMMUNITY): Payer: Medicare Other

## 2018-03-21 ENCOUNTER — Inpatient Hospital Stay (HOSPITAL_COMMUNITY): Payer: Medicare Other | Admitting: Anesthesiology

## 2018-03-21 ENCOUNTER — Encounter (HOSPITAL_COMMUNITY): Payer: Self-pay | Admitting: Anesthesiology

## 2018-03-21 ENCOUNTER — Encounter (HOSPITAL_COMMUNITY)
Admission: EM | Disposition: A | Discharge status: Rehabilitation Facility | Payer: Self-pay | Source: Home / Self Care | Attending: Internal Medicine

## 2018-03-21 DIAGNOSIS — A4101 Sepsis due to Methicillin susceptible Staphylococcus aureus: Secondary | ICD-10-CM

## 2018-03-21 DIAGNOSIS — R7881 Bacteremia: Secondary | ICD-10-CM

## 2018-03-21 HISTORY — PX: TEE WITHOUT CARDIOVERSION: SHX5443

## 2018-03-21 LAB — CBC
HCT: 37 % — ABNORMAL LOW (ref 39.0–52.0)
Hemoglobin: 11.4 g/dL — ABNORMAL LOW (ref 13.0–17.0)
MCH: 25.9 pg — ABNORMAL LOW (ref 26.0–34.0)
MCHC: 30.8 g/dL (ref 30.0–36.0)
MCV: 83.9 fL (ref 78.0–100.0)
Platelets: 547 10*3/uL — ABNORMAL HIGH (ref 150–400)
RBC: 4.41 MIL/uL (ref 4.22–5.81)
RDW: 16.1 % — ABNORMAL HIGH (ref 11.5–15.5)
WBC: 12.7 10*3/uL — ABNORMAL HIGH (ref 4.0–10.5)

## 2018-03-21 LAB — BASIC METABOLIC PANEL
Anion gap: 10 (ref 5–15)
BUN: 13 mg/dL (ref 6–20)
CO2: 31 mmol/L (ref 22–32)
Calcium: 8.2 mg/dL — ABNORMAL LOW (ref 8.9–10.3)
Chloride: 95 mmol/L — ABNORMAL LOW (ref 101–111)
Creatinine, Ser: 0.74 mg/dL (ref 0.61–1.24)
GFR calc Af Amer: >60 mL/min (ref >60)
GFR calc non Af Amer: >60 mL/min (ref >60)
Glucose, Bld: 96 mg/dL (ref 65–99)
Potassium: 3.4 mmol/L — ABNORMAL LOW (ref 3.5–5.1)
Sodium: 136 mmol/L (ref 135–145)

## 2018-03-21 LAB — SEDIMENTATION RATE: Sed Rate: 94 mm/hr — ABNORMAL HIGH (ref 0–16)

## 2018-03-21 LAB — C-REACTIVE PROTEIN: CRP: 17.4 mg/dL — ABNORMAL HIGH (ref <1.0)

## 2018-03-21 SURGERY — ECHOCARDIOGRAM, TRANSESOPHAGEAL
Anesthesia: Monitor Anesthesia Care

## 2018-03-21 MED ORDER — ALBUTEROL SULFATE (2.5 MG/3ML) 0.083% IN NEBU: 2.5000 mg | INHALATION_SOLUTION | RESPIRATORY_TRACT | Status: DC | PRN

## 2018-03-21 MED ORDER — SODIUM CHLORIDE BACTERIOSTATIC 0.9 % IJ SOLN
INTRAMUSCULAR | Status: AC
  Filled 2018-03-21: qty 20

## 2018-03-21 MED ORDER — PROPOFOL 500 MG/50ML IV EMUL
INTRAVENOUS | Status: DC | PRN
  Administered 2018-03-21: 75 ug/kg/min via INTRAVENOUS

## 2018-03-21 MED ORDER — MIDAZOLAM HCL 2 MG/2ML IJ SOLN: 0.5000 mg | INTRAMUSCULAR | Status: DC | PRN

## 2018-03-21 MED ORDER — LACTATED RINGERS IV SOLN
INTRAVENOUS | Status: DC
  Administered 2018-03-21: 11:00:00 via INTRAVENOUS

## 2018-03-21 MED ORDER — LIDOCAINE VISCOUS 2 % MT SOLN
OROMUCOSAL | Status: AC
  Filled 2018-03-21: qty 15

## 2018-03-21 NOTE — Progress Notes (Signed)
Report called to 5W RN, pt transported by [facility] in stable condition

## 2018-03-21 NOTE — Anesthesia Postprocedure Evaluation (Signed)
Anesthesia Post Note  Patient: Cody Velez  Procedure(s) Performed: TRANSESOPHAGEAL ECHOCARDIOGRAM (TEE) WITH PROPOFOL (N/A )  Patient location during evaluation: PACU Anesthesia Type: MAC Level of consciousness: awake and alert, oriented and patient cooperative Pain management: pain level controlled Vital Signs Assessment: post-procedure vital signs reviewed and stable Respiratory status: spontaneous breathing, respiratory function stable and patient connected to nasal cannula oxygen Cardiovascular status: stable Postop Assessment: no apparent nausea or vomiting Anesthetic complications: no     Last Vitals:  Vitals:   03/21/18 1245 03/21/18 1252  BP: 140/77   Pulse: 76 81  Resp: (!) 23 18  Temp:    SpO2: 94% 98%    Last Pain:  Vitals:   03/21/18 1209  TempSrc:   PainSc: 8                  Teyla Skidgel A

## 2018-03-21 NOTE — Progress Notes (Signed)
Patient ID: Cody Velez, male   DOB: Jun 09, 1946, 72 y.o.   MRN: [id number]         [facility] for Infectious Disease    Date of Admission:  03/15/2018   Total days of antibiotics 6        Day 4 cefazolin  He had one blood culture on admission that grew MSSA.  He has defervesced on antibiotic therapy and repeat blood cultures are negative at 3 days.  There was no evidence of endocarditis by TEE today.  I recommend 8 more days of IV cefazolin, assuming there is no evidence of infection of his recently implanted spinal cord stimulator.  I would be comfortable with PICC placement at any time.  Please call if I can be of further assistance.         Michel Bickers, MD [facility] for Infectious [facility] [phone] pager   [phone] cell 03/21/2018, 5:15 PM

## 2018-03-21 NOTE — Care Management Note (Signed)
Case Management Note  Patient Details  Name: DAHL HIGINBOTHAM MRN: [id number] Date of Birth: [birth year]  If discussed at Long Length of Stay Meetings, dates discussed:  03/21/18  Additional Comments:  Sherald Barge, RN 03/21/2018, 12:18 PM

## 2018-03-21 NOTE — Progress Notes (Signed)
Unable to do MRI due to restrictions on Spinal Stimulator.  This has been discussed with the radiologists and they agree to not proceed with exam.  For further questions please call [phone] to speak to a radiologist. Most of the radiologists are aware of this patients situation.

## 2018-03-21 NOTE — Anesthesia Preprocedure Evaluation (Signed)
Anesthesia Evaluation  Patient identified by MRN, date of birth, ID band Patient awake    Reviewed: Allergy & Precautions, NPO status , Patient's Chart, lab work & pertinent test results  Airway Mallampati: III   Neck ROM: Limited   Comment: Reports ACDF, not noted in history. Marked decrease in ROM Dental   Pulmonary asthma , sleep apnea , pneumonia, former smoker,    breath sounds clear to auscultation       Cardiovascular Exercise Tolerance: Poor hypertension, + CAD   Rhythm:Regular     Neuro/Psych  Headaches, Anxiety Depression    GI/Hepatic GERD  ,  Endo/Other  Morbid obesity  Renal/GU Renal disease     Musculoskeletal  (+) Arthritis ,   Abdominal   Peds  Hematology   Anesthesia Other Findings   Reproductive/Obstetrics                             Anesthesia Physical Anesthesia Plan  ASA: III  Anesthesia Plan: MAC   Post-op Pain Management:    Induction: Intravenous  PONV Risk Score and Plan:   Airway Management Planned: Nasal Cannula  Additional Equipment:   Intra-op Plan:   Post-operative Plan:   Informed Consent: I have reviewed the patients History and Physical, chart, labs and discussed the procedure including the risks, benefits and alternatives for the proposed anesthesia with the patient or authorized representative who has indicated his/her understanding and acceptance.     Plan Discussed with: CRNA  Anesthesia Plan Comments:         Anesthesia Quick Evaluation

## 2018-03-21 NOTE — Transfer of Care (Signed)
Immediate Anesthesia Transfer of Care Note  Patient: Cody Velez  Procedure(s) Performed: TRANSESOPHAGEAL ECHOCARDIOGRAM (TEE) WITH PROPOFOL (N/A )  Patient Location: PACU  Anesthesia Type:MAC  Level of Consciousness: drowsy and patient cooperative  Airway & Oxygen Therapy: Patient Spontanous Breathing and Patient connected to nasal cannula oxygen  Post-op Assessment: Report given to RN and Post -op Vital signs reviewed and stable  Post vital signs: Reviewed and stable  Last Vitals:  Vitals Value Taken Time  BP 120/81 03/21/2018 12:41 PM  Temp    Pulse 79 03/21/2018 12:42 PM  Resp 22 03/21/2018 12:42 PM  SpO2 90 % 03/21/2018 12:42 PM  Vitals shown include unvalidated device data.  Last Pain:  Vitals:   03/21/18 1209  TempSrc:   PainSc: 8       Patients Stated Pain Goal: 3 (74/[id number])  Complications: No apparent anesthesia complications

## 2018-03-21 NOTE — Progress Notes (Signed)
PT Cancellation Note  Patient Details Name: Cody Velez MRN: [id number] DOB: 09-May-1946   Cancelled Treatment:    Reason Eval/Treat Not Completed: Medical issues which prohibited therapy.  Treatment held per RN request due to patient s/p TEE, fatigued and possible to transfer to [facility] today.   3:16 PM, 03/21/18 Lonell Grandchild, MPT Physical Therapist with [facility] [phone] office [phone] mobile phone

## 2018-03-21 NOTE — Progress Notes (Signed)
Pharmacy Antibiotic Note  Cody Velez is a 72 y.o. male admitted on 03/15/2018 with sepsis.  Pharmacy has been consulted for Ancef dosing for MSSA bacteremia.  Plan: Continue Ancef 2gm IV every 8 hours. Monitor labs, micro and vitals.   Height: 6\' 3"  (190.5 cm) Weight: (!) 303 lb 12.7 oz (137.8 kg) IBW/kg (Calculated) : 84.5  Temp (24hrs), Avg:98.5 F (36.9 C), Min:98.3 F (36.8 C), Max:98.6 F (37 C)  Recent Labs  Lab 03/15/18 2006 03/16/18 0429 03/17/18 0609 03/19/18 0642 03/20/18 0703 03/21/18 0449  WBC  --  22.9* 18.2* 17.6* 14.5* 12.7*  CREATININE  --  1.53* 1.02 0.77 0.81 0.74  LATICACIDVEN 1.84 1.1  --   --   --   --     Estimated Creatinine Clearance: 126.7 mL/min (by C-G formula based on SCr of 0.74 mg/dL).    No Known Allergies  Antimicrobials this admission: Vancomycin 3/28 >> 3/30 Zosyn 3/28 >> 3/30 Ancef 3/30 >>   Dose adjustments this admission: n/a   Microbiology results: 3/30 Repeat BCx: pending 3/27 BCx: 1 or 2 = MSSA 3/27 UCx: insignificant growth  Thank you for allowing pharmacy to be a part of this patient's care.  Hart Robinsons A 03/21/2018 9:34 AM

## 2018-03-21 NOTE — Progress Notes (Addendum)
PROGRESS NOTE  Cody Velez JHE:[id number] DOB: [birth year] DOA: 03/15/2018 PCP: Asencion Noble, MD   Brief History: 72 year old male with a history of hypertension, hyperlipidemia, morbid obesity, coronary artery disease, chronic back pain status post spinal stimulator(C-spine) placed 1/30/19presented with worsening confusion and a mechanical fall at home. The patient's significant other contributes to the history at this time as he was not able to provide good history secondary to his encephalopathy initially. Apparently, the patient has had subjective fevers and chills for the better part of one month. In the past week, he has had worsening confusion. On 03/11/2018, the patient was returning from a trip from [facility]. Apparently he fell in the bathroom at [facility] and was not able to get up. He was admitted to [facility] 03/11/2018 and discharged on 03/13/2018. He was at his baseline at the time of discharge. However, he has had increasing confusion on 03/14/2018 and 03/15/2018. His significant other feels that the patient had been overusing his Percocet at home which may have been contributing to his falls and confusion. However, she has taken away his Percocets since his discharge from [facility], he has not had any in the 2 days prior to this admission. However, the patient has been increasing his use of ibuprofen for his pain. The patient denies any worsening headache, neck pain, nausea, vomiting, diarrhea, abdominal pain, dysuria, hematuria. He complains of left and right flank pain, left greater than right. In the emergency department, the patient had fever 102.4 F with tachycardia. He remained hemodynamically stable with oxygen saturation of 92% on room air. CT angiogram of the chest was negative for pulmonary embolus or dissection. There was no consolidation or pleural effusion. CT abdomen and pelvis was unremarkable for acute findings. There was nonobstructive  bilateral renal calculi. The patient was started on Zosyn, IV fluids, and admitted for further workup. The patient followed up with urology on the morning of 03/15/2018. There was concern for UTI. The patient was prescribed doxycycline of which she took 1 dose prior to going to the emergency department.  The patient was started on empiric antibiotics.  Cultures grew MSSA.  His antibiotics were narrowed to cefazolin.  The patient developed gradual worsening of lower extremity weakness during hospitalization.  After multiple discussions with neuroradiology and the patient's pain management specialist, it was initially felt that MRI of his spine would be of low value and risk would outweigh the benefit given the limitations placed on the MRI parameters by the patient spinal cord stimulator as well as the patient's body habitus.  Subsequently, CT of the thoracic lumbar spine was obtained which showed paravertebral edema at T10 and T11.  The case was discussed again with neuroradiology, Dr. Carlis Abbott, who felt that in the current setting that MRI should still be pursued despite the limitations placed by the patient's body habitus and a spinal cord stimulator.  The case was discussed with ID and the patient's family who agreed with transfer to [facility]. Case was also discussed with neurosurgery, Dr. Lillia Corporal below.  Assessment/Plan: Sepsis/MSSA bacteremia/ PNA -Source likely PNA, less likely spinal cord stimulator/battery pack( CT no signs of infection) -he was initially started onZosyn/[place] admission,  -ID input noted-->cefazolin -UA 6-30 WBC,urine culture insignificant growth, -Viral respiratory panel--neg -Lactic acidimproved from 1.8-1.1, - procalcitonin--2.15>>>1.28,now down to 0.77 -d/cIV fluids -spoke with pt's pain management specialist-->stimulator is MRI compatible using 1.5T magnet -spoke with neuroradiology x 2 (Dr. Jeralyn Ruths and Dr. Jewel Baize to  perform/no value to perform  MR of spine due to parameters of spinal cord stimulator and pt's body habitus -discussed with Dr. Baird Cancer ([facility]--[phone]))--would not remove stimulator unless MR shows device infection -03/18/18 repeat blood cultures neg -03/21/18 TEE --no vegetation -PICC line once 3/30 blood cultures remain neg x 5 days  Leg weakness -likely deconditioning--patient had lower extremity weakness on the first day of hospitalization and pt is clinically improving otherwise -03/20/18--CT Thoracic and lumbar spine--paravertebral edema T10 and T11 cannot rule out infectious process -03/21/18-Discussed with neuroradiology (Dr. Braulio Conte with limitations placed by spinal stimulator and pt's body habitus--would be work trying MR T and L spine to r/o infection -03/21/18--spoke with MRI at [facility]--queried [place] spinal stimulator database-->unable to perform MRI of spine safely (only able to perform Mr of brain and extremities with Nevro stimulator) -discussed with ID--Dr. Beverly Gust see pt after transfer -03/21/18--case reviewed with neurosurgery (Dr. Enzo Bi abnormality not likely cause of leg weakness; if still concern about infection-->next step is IR biopsy/drainage of T10-T11 paravertebral area and/or myelogram -no cord level noted on exam -discussed with pt and family-->agree with transfer to [facility]  Hx of Lumbar fusion -performed by Dr. Ellene Route  Acute kidney injury -Baseline creatinine 1.0-1.2 -Presenting creatinine 2.09 -Secondary to sepsis and volume depletionin setting of NSAID use. Urine cultureinsignificant growth -resolved  Acute respiratory failure with hypoxia -due to pulm edema; improved with lasix x 1 -03/17/18--personally reviewed CXR--increase interstitial markings, RLL opacity -stable on 1L -d/c ivf, prn lasix  Essential hypertension -Holding losartan in the setting of AKI -Continue amlodipine  Acute metabolic encephalopathy -Secondary to  sepsis -improved, back to baseline -Significant other states that he has not used his Percocet in the past 48 hours prior to admission -Per family patient was confused at [facility] when he was hospitalized,CTheadthere are no acute findings -CT brain --neg  Myoclonus -Likely secondary to the patient's infectious process and gabapentin setting of renal failure -EEG--normal -Reduced gabapentin dose-->resolved  Cognitive impairment -Continue Aricept and Namenda  Chronic pain syndrome -Minimize hypnotic and opioids -Continue gabapentin--decrease dose for renal failure  Depression -Continue home dose of venlafaxine -Holding trazodone in the setting of encephalopathy  Hyperlipidemia -Continue statin  Coronary artery disease -No chest pain presently -Personally reviewed EKG--sinus rhythm, no ST-T wave changes -Personally reviewed chest x-ray-increased interstitial markings without consolidation  Hypokalemia -repleted -check mag--2.0  Transaminasemia -due to sepsis -no RUQ pain -am CMP  Body mass index is 38.25 kg/m.  Disposition Plan: Transfer to [place] other and daughter updatedat bedside4/2--Total time spent 50 minutes.  Greater than 50% spent face to face counseling and coordinating care.   Consultants:none  Code Status: FULL  DVT Prophylaxis: [place] Lovenox   Procedures: As Listed in Progress Note Above  Antibiotics: Zosyn 3/27>>>3/30 vanco 4/27>>>3/30 Cefazolin 3/30>>>        Subjective: Patient states that his middle and low back pain are about the same.  He states that his lower extremity weakness is not any worse.  He denies any headache, chest pain, shortness breath, nausea, vomiting, diarrhea, abdominal pain.  There is no dysuria or hematuria.  Objective: Vitals:   03/20/18 1937 03/20/18 2157 03/21/18 0623 03/21/18 0733  BP:  (!) 124/51 (!) 146/75   Pulse: 82 80 81    Resp: 16 16 16    Temp:  98.3 F (36.8 C) 98.6 F (37 C)   TempSrc:  Oral Oral   SpO2: 93% 96% 94% 91%  Weight:   (!) 137.8 kg (303  lb 12.7 oz)   Height:        Intake/Output Summary (Last 24 hours) at 03/21/2018 1024 Last data filed at 03/21/2018 0853 Gross per 24 hour  Intake 980 ml  Output 1950 ml  Net -970 ml   Weight change: -0.2 kg (-7.1 oz) Exam:   General:  Pt is alert, follows commands appropriately, not in acute distress  HEENT: No icterus, No thrush, No neck mass, [place]/AT  Cardiovascular: RRR, S1/S2, no rubs, no gallops  Respiratory: Bibasilar crackles but no wheezing.  Good air movement.  Abdomen: Soft/+BS, non tender, non distended, no guarding  Extremities: No edema, No lymphangitis, No petechiae, No rashes, no synovitis  Neuro--sensation intact to touch bilateral LE;  Patellar reflex 1/4 bilateral.  Strength 3/5 RLE, 3-/5 LLE   Data Reviewed: I have personally reviewed following labs and imaging studies Basic Metabolic Panel: Recent Labs  Lab 03/16/18 0429 03/16/18 0826 03/17/18 0609 03/19/18 0642 03/20/18 0703 03/21/18 0449  NA 137  --  135 132* 134* 136  K 3.3*  --  3.8 2.9* 3.3* 3.4*  CL 101  --  99* 92* 95* 95*  CO2 23  --  25 28 29 31   GLUCOSE 131*  --  108* 116* 92 96  BUN 25*  --  20 16 17 13   CREATININE 1.53*  --  1.02 0.77 0.81 0.74  CALCIUM 8.6*  --  8.4* 8.6* 8.5* 8.2*  MG  --  2.0 2.2 1.9 2.0  --    Liver Function Tests: Recent Labs  Lab 03/15/18 1859 03/17/18 0609 03/19/18 0642 03/20/18 0703  AST 50* 47* 70* 65*  ALT 55 41 51 46  ALKPHOS 118 109 115 101  BILITOT 1.4* 0.9 1.1 1.1  PROT 7.7 6.7 6.8 6.9  ALBUMIN 2.7* 2.3* 2.0* 2.0*   No results for input(s): LIPASE, AMYLASE in the last 168 hours. Recent Labs  Lab 03/19/18 0642 03/20/18 0703  AMMONIA 36* 32   Coagulation Profile: No results for input(s): INR, PROTIME in the last 168 hours. CBC: Recent Labs  Lab 03/15/18 1859 03/16/18 0429 03/17/18 0609  03/19/18 0642 03/20/18 0703 03/21/18 0449  WBC 25.7* 22.9* 18.2* 17.6* 14.5* 12.7*  NEUTROABS 24.2*  --   --   --   --   --   HGB 11.4* 10.7* 11.0* 11.5* 10.9* 11.4*  HCT 35.3* 33.7* 34.7* 35.9* 35.2* 37.0*  MCV 83.6 83.4 84.4 81.0 83.4 83.9  PLT 435* 428* 410* 481* 486* 547*   Cardiac Enzymes: Recent Labs  Lab 03/15/18 1859 03/16/18 0826 03/17/18 0609  CKTOTAL  --  438* 113  TROPONINI <0.03  --   --    BNP: Invalid input(s): POCBNP CBG: No results for input(s): GLUCAP in the last 168 hours. HbA1C: No results for input(s): HGBA1C in the last 72 hours. Urine analysis:    Component Value Date/Time   COLORURINE AMBER (A) 03/15/2018 1830   APPEARANCEUR CLOUDY (A) 03/15/2018 1830   LABSPEC 1.029 03/15/2018 1830   PHURINE 5.0 03/15/2018 1830   GLUCOSEU 50 (A) 03/15/2018 1830   HGBUR MODERATE (A) 03/15/2018 1830   BILIRUBINUR MODERATE (A) 03/15/2018 1830   KETONESUR NEGATIVE 03/15/2018 1830   PROTEINUR 100 (A) 03/15/2018 1830   UROBILINOGEN 0.2 03/05/2011 1909   NITRITE NEGATIVE 03/15/2018 1830   LEUKOCYTESUR NEGATIVE 03/15/2018 1830   Sepsis Labs: @LABRCNTIP (procalcitonin:4,lacticidven:4) ) Recent Results (from the past 240 hour(s))  Urine culture     Status: Abnormal   Collection Time: 03/15/18  6:30  PM  Result Value Ref Range Status   Specimen Description   Final    URINE, RANDOM Performed at [facility], [address], [place], [place] [address]    Special Requests   Final    NONE Performed at [facility], [address], [place], [place] [address]    Culture (A)  Final    <10,000 COLONIES/mL INSIGNIFICANT GROWTH Performed at [facility] [address], [place], [place] [address]    Report Status 03/17/2018 FINAL  Final  Culture, blood (routine x 2)     Status: Abnormal   Collection Time: 03/15/18  8:00 PM  Result Value Ref Range Status   Specimen Description   Final    BLOOD RIGHT WRIST Performed at [facility], [place] [address],  [place], [place] [address]    Special Requests   Final    BOTTLES DRAWN AEROBIC AND ANAEROBIC Blood Culture adequate volume Performed at [facility], [address], [place], [place] [address]    Culture  Setup Time   Final    GRAM POSITIVE COCCI IN BOTH AEROBIC AND ANAEROBIC BOTTLES Performed at [facility] Gram Stain Report Called to,Read Back By and Verified With: JOHNSON,[id number]  BY MATTHEWS, B 3.30.19 CRITICAL RESULT CALLED TO, READ BACK BY AND VERIFIED WITH: NATE HAYES,PHARMD [id number] BY SJW Performed at [facility], [place] [address] [place], [place] [address]    Culture STAPHYLOCOCCUS AUREUS (A)  Final   Report Status 03/20/2018 FINAL  Final   Organism ID, Bacteria STAPHYLOCOCCUS AUREUS  Final      Susceptibility   Staphylococcus aureus - MIC*    CIPROFLOXACIN <=0.5 SENSITIVE Sensitive     ERYTHROMYCIN 0.5 SENSITIVE Sensitive     GENTAMICIN <=0.5 SENSITIVE Sensitive     OXACILLIN <=0.25 SENSITIVE Sensitive     TETRACYCLINE <=1 SENSITIVE Sensitive     VANCOMYCIN <=0.5 SENSITIVE Sensitive     TRIMETH/SULFA <=10 SENSITIVE Sensitive     CLINDAMYCIN <=0.25 SENSITIVE Sensitive     RIFAMPIN <=0.5 SENSITIVE Sensitive     Inducible Clindamycin NEGATIVE Sensitive     * STAPHYLOCOCCUS AUREUS  Blood Culture ID Panel (Reflexed)     Status: Abnormal   Collection Time: 03/15/18  8:00 PM  Result Value Ref Range Status   Enterococcus species NOT DETECTED NOT DETECTED Final   Listeria monocytogenes NOT DETECTED NOT DETECTED Final   Staphylococcus species DETECTED (A) NOT DETECTED Final    Comment: CRITICAL RESULT CALLED TO, READ BACK BY AND VERIFIED WITH: NATE HAYES PHARMD AT 0954 ON [id number] BY SJW    Staphylococcus aureus DETECTED (A) NOT DETECTED Final    Comment: CRITICAL RESULT CALLED TO, READ BACK BY AND VERIFIED WITH: NATE HAYES PHARMD AT 0954 ON [id number] BY SJW    Methicillin resistance NOT DETECTED NOT DETECTED Final   Streptococcus species NOT DETECTED NOT DETECTED  Final   Streptococcus agalactiae NOT DETECTED NOT DETECTED Final   Streptococcus pneumoniae NOT DETECTED NOT DETECTED Final   Streptococcus pyogenes NOT DETECTED NOT DETECTED Final   Acinetobacter baumannii NOT DETECTED NOT DETECTED Final   Enterobacteriaceae species NOT DETECTED NOT DETECTED Final   Enterobacter cloacae complex NOT DETECTED NOT DETECTED Final   Escherichia coli NOT DETECTED NOT DETECTED Final   Klebsiella oxytoca NOT DETECTED NOT DETECTED Final   Klebsiella pneumoniae NOT DETECTED NOT DETECTED Final   Proteus species NOT DETECTED NOT DETECTED Final   Serratia marcescens NOT DETECTED NOT DETECTED Final   Haemophilus  influenzae NOT DETECTED NOT DETECTED Final   Neisseria meningitidis NOT DETECTED NOT DETECTED Final   Pseudomonas aeruginosa NOT DETECTED NOT DETECTED Final   Candida albicans NOT DETECTED NOT DETECTED Final   Candida glabrata NOT DETECTED NOT DETECTED Final   Candida krusei NOT DETECTED NOT DETECTED Final   Candida parapsilosis NOT DETECTED NOT DETECTED Final   Candida tropicalis NOT DETECTED NOT DETECTED Final    Comment: Performed at [facility], [place] [address] [place], [place] [address]  Culture, blood (routine x 2)     Status: None   Collection Time: 03/15/18  8:21 PM  Result Value Ref Range Status   Specimen Description BLOOD LEFT HAND  Final   Special Requests   Final    BOTTLES DRAWN AEROBIC AND ANAEROBIC Blood Culture adequate volume   Culture   Final    NO GROWTH 5 DAYS Performed at [facility], [address] [place], [place] [address]    Report Status 03/20/2018 FINAL  Final  Respiratory Panel by PCR     Status: None   Collection Time: 03/16/18 11:40 AM  Result Value Ref Range Status   Adenovirus NOT DETECTED NOT DETECTED Final   Coronavirus 229E NOT DETECTED NOT DETECTED Final   Coronavirus HKU1 NOT DETECTED NOT DETECTED Final   Coronavirus NL63 NOT DETECTED NOT DETECTED Final   Coronavirus OC43 NOT DETECTED NOT DETECTED Final    Metapneumovirus NOT DETECTED NOT DETECTED Final   Rhinovirus / Enterovirus NOT DETECTED NOT DETECTED Final   Influenza A NOT DETECTED NOT DETECTED Final   Influenza B NOT DETECTED NOT DETECTED Final   Parainfluenza Virus 1 NOT DETECTED NOT DETECTED Final   Parainfluenza Virus 2 NOT DETECTED NOT DETECTED Final   Parainfluenza Virus 3 NOT DETECTED NOT DETECTED Final   Parainfluenza Virus 4 NOT DETECTED NOT DETECTED Final   Respiratory Syncytial Virus NOT DETECTED NOT DETECTED Final   Bordetella pertussis NOT DETECTED NOT DETECTED Final   Chlamydophila pneumoniae NOT DETECTED NOT DETECTED Final   Mycoplasma pneumoniae NOT DETECTED NOT DETECTED Final    Comment: Performed at [facility], [place] [address], [place], [place] [address]  Culture, blood (routine x 2)     Status: None (Preliminary result)   Collection Time: 03/18/18 12:38 PM  Result Value Ref Range Status   Specimen Description LEFT ANTECUBITAL  Final   Special Requests   Final    BOTTLES DRAWN AEROBIC AND ANAEROBIC Blood Culture adequate volume   Culture   Final    NO GROWTH 3 DAYS Performed at [facility], [address], [place], [place] [address]    Report Status PENDING  Incomplete  Culture, blood (routine x 2)     Status: None (Preliminary result)   Collection Time: 03/18/18 12:45 PM  Result Value Ref Range Status   Specimen Description BLOOD LEFT HAND  Final   Special Requests   Final    BOTTLES DRAWN AEROBIC AND ANAEROBIC Blood Culture results may not be optimal due to an inadequate volume of blood received in culture bottles   Culture   Final    NO GROWTH 3 DAYS Performed at [facility], [address], [place], [place] [address]    Report Status PENDING  Incomplete  C difficile quick scan w PCR reflex     Status: None   Collection Time: 03/19/18  5:04 PM  Result Value Ref Range Status   C Diff antigen NEGATIVE NEGATIVE Final   C Diff toxin  NEGATIVE NEGATIVE Final   C Diff interpretation No C. difficile  detected.  Final    Comment: VALID Performed at [facility], [address] [place], [place] [address]   Gastrointestinal Panel by PCR , Stool     Status: None   Collection Time: 03/19/18  5:04 PM  Result Value Ref Range Status   Campylobacter species NOT DETECTED NOT DETECTED Final   Plesimonas shigelloides NOT DETECTED NOT DETECTED Final   Salmonella species NOT DETECTED NOT DETECTED Final   Yersinia enterocolitica NOT DETECTED NOT DETECTED Final   Vibrio species NOT DETECTED NOT DETECTED Final   Vibrio cholerae NOT DETECTED NOT DETECTED Final   Enteroaggregative E coli (EAEC) NOT DETECTED NOT DETECTED Final   Enteropathogenic E coli (EPEC) NOT DETECTED NOT DETECTED Final   Enterotoxigenic E coli (ETEC) NOT DETECTED NOT DETECTED Final   Shiga like toxin producing E coli (STEC) NOT DETECTED NOT DETECTED Final   Shigella/Enteroinvasive E coli (EIEC) NOT DETECTED NOT DETECTED Final   Cryptosporidium NOT DETECTED NOT DETECTED Final   Cyclospora cayetanensis NOT DETECTED NOT DETECTED Final   Entamoeba histolytica NOT DETECTED NOT DETECTED Final   Giardia lamblia NOT DETECTED NOT DETECTED Final   Adenovirus F40/41 NOT DETECTED NOT DETECTED Final   Astrovirus NOT DETECTED NOT DETECTED Final   Norovirus GI/GII NOT DETECTED NOT DETECTED Final   Rotavirus A NOT DETECTED NOT DETECTED Final   Sapovirus (I, II, IV, and V) NOT DETECTED NOT DETECTED Final    Comment: Performed at [facility], [address] [place], [place] [address]     Scheduled Meds: . [MAR Hold] amLODipine  10 mg Oral Daily  . [MAR Hold] aspirin EC  81 mg Oral Daily  . [MAR Hold] cycloSPORINE  1 drop Both Eyes BID  . [MAR Hold] donepezil  10 mg Oral QHS  . [MAR Hold] enoxaparin (LOVENOX) injection  40 mg Subcutaneous Q24H  . [MAR Hold] finasteride  5 mg Oral Daily  . [MAR Hold] gabapentin  600 mg Oral TID  . [MAR Hold] ipratropium-albuterol  3 mL Nebulization BID  . [MAR Hold] latanoprost  1 drop Both Eyes  QHS  . [MAR Hold] memantine  10 mg Oral BID  . [MAR Hold] pantoprazole  40 mg Oral Daily  . [MAR Hold] pravastatin  10 mg Oral QPM  . [MAR Hold] saccharomyces boulardii  250 mg Oral BID WC  . [MAR Hold] traZODone  100 mg Oral QHS  . [MAR Hold] venlafaxine XR  150 mg Oral BID  . [MAR Hold] vitamin B-12  1,000 mcg Oral Daily   Continuous Infusions: . [MAR Hold]  ceFAZolin (ANCEF) IV Stopped (03/21/18 0233)    Procedures/Studies: Dg Abd 1 View  Result Date: 03/19/2018 CLINICAL DATA:  Abdominal distension. EXAM: ABDOMEN - 1 VIEW COMPARISON:  None. FINDINGS: Nonspecific gaseous distension of the colon. There is no bowel dilatation to suggest obstruction. There is no evidence of pneumoperitoneum, portal venous gas or pneumatosis. There are no pathologic calcifications along the expected course of the ureters. There is posterior lumbar interbody fusion from L1 through L3. There is interbody fusion at L3-4 and L4-5. IMPRESSION: Negative. Electronically Signed   By: Kathreen Devoid   On: 03/19/2018 16:39   Ct Head Wo Contrast  Result Date: 03/19/2018 CLINICAL DATA:  Altered level of consciousness. Fall on 03/11/2018 with increasing confusion. EXAM: CT HEAD WITHOUT CONTRAST TECHNIQUE: Contiguous axial images were obtained from the base of the skull through the vertex without intravenous  contrast. COMPARISON:  Outside brain MRI 05/02/2015 FINDINGS: Brain: There is no evidence of acute large territory infarct, intracranial hemorrhage, mass, midline shift, or extra-axial fluid collection. Mild lateral, third, and fourth ventriculomegaly is unchanged and may reflect cerebral atrophy or normal pressure hydrocephalus in the appropriate clinical setting. The right lateral ventricle remains slightly asymmetrically dilated compared to the left, most notably in the temporal horn. Patchy to confluent hypodensities throughout the cerebral white matter correspond to extensive T2 hyperintensities on MRI and are nonspecific  but compatible with chronic small vessel ischemic disease. There is mild patchy hypoattenuation in the pons. Vascular: Mild calcified atherosclerosis at the skull base. No hyperdense vessel. Skull: No fracture or focal osseous lesion. Sinuses/Orbits: Prior functional sinus surgery. No acute sinus findings. Clear mastoid air cells. Unremarkable orbits. Other: None. IMPRESSION: 1. No evidence of acute intracranial abnormality. 2. Extensive chronic small vessel ischemic disease. 3. Unchanged mild chronic ventriculomegaly as above. Electronically Signed   By: Logan Bores M.D.   On: 03/19/2018 15:38   Ct Angio Chest Pe W And/or Wo Contrast  Result Date: 03/15/2018 CLINICAL DATA:  72 year old male with acute generalized abdominal pain and fever. EXAM: CT ANGIOGRAPHY CHEST CT ABDOMEN AND PELVIS WITH CONTRAST TECHNIQUE: Multidetector CT imaging of the chest was performed using the standard protocol during bolus administration of intravenous contrast. Multiplanar CT image reconstructions and MIPs were obtained to evaluate the vascular anatomy. Multidetector CT imaging of the abdomen and pelvis was performed using the standard protocol during bolus administration of intravenous contrast. CONTRAST:  79mL ISOVUE-370 IOPAMIDOL (ISOVUE-370) INJECTION 76% COMPARISON:  Abdominal CT dated 03/15/2018 FINDINGS: CTA CHEST FINDINGS Cardiovascular: Top-normal cardiac size. No pericardial effusion. Mild atherosclerotic calcification of the thoracic aorta. No aneurysmal dilatation or evidence of dissection. The origins of the great vessels of the aortic arch are patent. Evaluation of the pulmonary arteries is limited due to suboptimal opacification and respiratory motion artifact. No large or central pulmonary artery embolus identified. Mediastinum/Nodes: There is no hilar or mediastinal adenopathy. Esophagus and the thyroid gland are grossly unremarkable. No mediastinal fluid collection. Lungs/Pleura: Bibasilar linear and streaky  densities, likely atelectasis/scarring. There is a 4 mm left lower lobe nodule (series 6, image 87). There is no focal consolidation, pleural effusion, or pneumothorax. The central airways are patent. Musculoskeletal: There is degenerative changes of the spine. No acute fracture. Stimulator device extending into the upper thoracic spine. Review of the MIP images confirms the above findings. CT ABDOMEN and PELVIS FINDINGS No intra-abdominal free air or free fluid. Hepatobiliary: Apparent mild fatty infiltration of the liver. No intrahepatic biliary ductal dilatation. The gallbladder is unremarkable. Pancreas: Unremarkable. No pancreatic ductal dilatation or surrounding inflammatory changes. Spleen: Normal in size without focal abnormality. Adrenals/Urinary Tract: The adrenal glands are unremarkable. Small nonobstructing bilateral renal calculi measure up to 5 mm in the upper pole of the left kidney. There is no hydronephrosis on either side. A 1 cm hypodense lesion from the superior pole of the right kidney demonstrates fluid attenuation most consistent with a cyst. The visualized ureters and urinary bladder appear unremarkable. Stomach/Bowel: Small hiatal hernia. There is no bowel obstruction or active inflammation. Air is noted throughout the colon. The appendix is normal. Vascular/Lymphatic: Mild aortoiliac atherosclerotic disease. No portal venous gas. There is no adenopathy. Reproductive: The prostate and seminal vesicles are grossly unremarkable. Other: Small fat containing bilateral inguinal and umbilical hernias. No fluid collection or inflammatory changes. Musculoskeletal: There is osteopenia with multilevel degenerative changes. L1-L3 posterior fusion hardware and L4-L5  and L5-S1 interbody disc spacer. No acute osseous pathology. Review of the MIP images confirms the above findings. IMPRESSION: 1. No acute intrathoracic, abdominal, or pelvic pathology. 2. Mild fatty liver. 3. Small nonobstructing bilateral  renal calculi.  No hydronephrosis. 4. Mild air distention of the colon. No bowel obstruction or active inflammation. Normal appendix. 5.  Aortic Atherosclerosis (ICD10-I70.0). 6. A 4 mm left lower lobe pulmonary nodule. No follow-up needed if patient is low-risk. Non-contrast chest CT can be considered in 12 months if patient is high-risk. This recommendation follows the consensus statement: Guidelines for Management of Incidental Pulmonary Nodules Detected on CT Images: From the Fleischner Society 2017; Radiology 2017; [phone]. 7. Electronically Signed   By: Anner Crete M.D.   On: 03/15/2018 22:25   Ct Thoracic Spine W Contrast  Result Date: 03/20/2018 CLINICAL DATA:  72 y/o M; 72 y/o M; mid and lower back pain with new leg weakness. Fall at home 03/15/2018. MSSA bacteremia. EXAM: CT THORACIC AND LUMBAR SPINE WITHOUT CONTRAST TECHNIQUE: Multidetector CT imaging of the thoracic and lumbar spine was performed without contrast. Multiplanar CT image reconstructions were also generated. COMPARISON:  03/15/2018 CT of chest, abdomen, and pelvis. FINDINGS: CT THORACIC SPINE FINDINGS Alignment: Normal. Vertebrae: No acute fracture or focal pathologic process. Multiple endplate Schmorl's nodes are stable from prior CT. No new loss of vertebral body height. Partially visualized anterior cervical discectomy and fusion. Paraspinal and other soft tissues: Spinal stimulator extending to the cervical spinal canal and above the field of view. Small bilateral pleural effusions. Paravertebral edema is present surrounding T10 and T11 vertebral bodies which may represent underlying occult fracture, bone contusion, possibly infection Disc levels: L2-3 loss of disc space height and endplate degenerative changes. T10-L1 hypertrophic facet arthropathy. CT LUMBAR SPINE FINDINGS Segmentation: 5 lumbar type vertebrae. Alignment: Normal. Vertebrae: L1-L3 posterior instrumented fusion and interbody fusion with laminectomy. L4-S1  interbody fusion. No new loss of vertebral body height or malalignment. Hardware is intact without apparent hardware related complication or periprosthetic lucency. Paraspinal and other soft tissues: Aortic atherosclerosis. Disc levels: Endplate marginal osteophytes and facet hypertrophy results in mild bony foraminal stenosis at the left L2-3 and L3-4 levels and moderate bony foraminal stenosis at L5-S1. On the right there is mild bony foraminal stenosis at L1-2, L2-3, and L5-S1. No high-grade bony canal stenosis. IMPRESSION: No acute fracture, malalignment, or bony erosive changes identified. However, paravertebral edema is present surrounding T10 and T11 vertebral bodies which may represent underlying occult fracture, bone contusion, or possibly infection. Consider thoracic spine MRI with and without contrast for further evaluation. These results will be called to the ordering clinician or representative by the Radiologist Assistant, and communication documented in the PACS or zVision Dashboard. Electronically Signed   By: Kristine Garbe M.D.   On: 03/20/2018 20:22   Ct Lumbar Spine W Contrast  Result Date: 03/20/2018 CLINICAL DATA:  72 y/o M; 72 y/o M; mid and lower back pain with new leg weakness. Fall at home 03/15/2018. MSSA bacteremia. EXAM: CT THORACIC AND LUMBAR SPINE WITHOUT CONTRAST TECHNIQUE: Multidetector CT imaging of the thoracic and lumbar spine was performed without contrast. Multiplanar CT image reconstructions were also generated. COMPARISON:  03/15/2018 CT of chest, abdomen, and pelvis. FINDINGS: CT THORACIC SPINE FINDINGS Alignment: Normal. Vertebrae: No acute fracture or focal pathologic process. Multiple endplate Schmorl's nodes are stable from prior CT. No new loss of vertebral body height. Partially visualized anterior cervical discectomy and fusion. Paraspinal and other soft tissues: Spinal stimulator extending to  the cervical spinal canal and above the field of view. Small  bilateral pleural effusions. Paravertebral edema is present surrounding T10 and T11 vertebral bodies which may represent underlying occult fracture, bone contusion, possibly infection Disc levels: L2-3 loss of disc space height and endplate degenerative changes. T10-L1 hypertrophic facet arthropathy. CT LUMBAR SPINE FINDINGS Segmentation: 5 lumbar type vertebrae. Alignment: Normal. Vertebrae: L1-L3 posterior instrumented fusion and interbody fusion with laminectomy. L4-S1 interbody fusion. No new loss of vertebral body height or malalignment. Hardware is intact without apparent hardware related complication or periprosthetic lucency. Paraspinal and other soft tissues: Aortic atherosclerosis. Disc levels: Endplate marginal osteophytes and facet hypertrophy results in mild bony foraminal stenosis at the left L2-3 and L3-4 levels and moderate bony foraminal stenosis at L5-S1. On the right there is mild bony foraminal stenosis at L1-2, L2-3, and L5-S1. No high-grade bony canal stenosis. IMPRESSION: No acute fracture, malalignment, or bony erosive changes identified. However, paravertebral edema is present surrounding T10 and T11 vertebral bodies which may represent underlying occult fracture, bone contusion, or possibly infection. Consider thoracic spine MRI with and without contrast for further evaluation. These results will be called to the ordering clinician or representative by the Radiologist Assistant, and communication documented in the PACS or zVision Dashboard. Electronically Signed   By: Kristine Garbe M.D.   On: 03/20/2018 20:22   Ct Abdomen Pelvis W Contrast  Result Date: 03/15/2018 CLINICAL DATA:  72 year old male with acute generalized abdominal pain and fever. EXAM: CT ANGIOGRAPHY CHEST CT ABDOMEN AND PELVIS WITH CONTRAST TECHNIQUE: Multidetector CT imaging of the chest was performed using the standard protocol during bolus administration of intravenous contrast. Multiplanar CT image  reconstructions and MIPs were obtained to evaluate the vascular anatomy. Multidetector CT imaging of the abdomen and pelvis was performed using the standard protocol during bolus administration of intravenous contrast. CONTRAST:  82mL ISOVUE-370 IOPAMIDOL (ISOVUE-370) INJECTION 76% COMPARISON:  Abdominal CT dated 03/15/2018 FINDINGS: CTA CHEST FINDINGS Cardiovascular: Top-normal cardiac size. No pericardial effusion. Mild atherosclerotic calcification of the thoracic aorta. No aneurysmal dilatation or evidence of dissection. The origins of the great vessels of the aortic arch are patent. Evaluation of the pulmonary arteries is limited due to suboptimal opacification and respiratory motion artifact. No large or central pulmonary artery embolus identified. Mediastinum/Nodes: There is no hilar or mediastinal adenopathy. Esophagus and the thyroid gland are grossly unremarkable. No mediastinal fluid collection. Lungs/Pleura: Bibasilar linear and streaky densities, likely atelectasis/scarring. There is a 4 mm left lower lobe nodule (series 6, image 87). There is no focal consolidation, pleural effusion, or pneumothorax. The central airways are patent. Musculoskeletal: There is degenerative changes of the spine. No acute fracture. Stimulator device extending into the upper thoracic spine. Review of the MIP images confirms the above findings. CT ABDOMEN and PELVIS FINDINGS No intra-abdominal free air or free fluid. Hepatobiliary: Apparent mild fatty infiltration of the liver. No intrahepatic biliary ductal dilatation. The gallbladder is unremarkable. Pancreas: Unremarkable. No pancreatic ductal dilatation or surrounding inflammatory changes. Spleen: Normal in size without focal abnormality. Adrenals/Urinary Tract: The adrenal glands are unremarkable. Small nonobstructing bilateral renal calculi measure up to 5 mm in the upper pole of the left kidney. There is no hydronephrosis on either side. A 1 cm hypodense lesion from  the superior pole of the right kidney demonstrates fluid attenuation most consistent with a cyst. The visualized ureters and urinary bladder appear unremarkable. Stomach/Bowel: Small hiatal hernia. There is no bowel obstruction or active inflammation. Air is noted throughout the colon. The  appendix is normal. Vascular/Lymphatic: Mild aortoiliac atherosclerotic disease. No portal venous gas. There is no adenopathy. Reproductive: The prostate and seminal vesicles are grossly unremarkable. Other: Small fat containing bilateral inguinal and umbilical hernias. No fluid collection or inflammatory changes. Musculoskeletal: There is osteopenia with multilevel degenerative changes. L1-L3 posterior fusion hardware and L4-L5 and L5-S1 interbody disc spacer. No acute osseous pathology. Review of the MIP images confirms the above findings. IMPRESSION: 1. No acute intrathoracic, abdominal, or pelvic pathology. 2. Mild fatty liver. 3. Small nonobstructing bilateral renal calculi.  No hydronephrosis. 4. Mild air distention of the colon. No bowel obstruction or active inflammation. Normal appendix. 5.  Aortic Atherosclerosis (ICD10-I70.0). 6. A 4 mm left lower lobe pulmonary nodule. No follow-up needed if patient is low-risk. Non-contrast chest CT can be considered in 12 months if patient is high-risk. This recommendation follows the consensus statement: Guidelines for Management of Incidental Pulmonary Nodules Detected on CT Images: From the Fleischner Society 2017; Radiology 2017; [phone]. 7. Electronically Signed   By: Anner Crete M.D.   On: 03/15/2018 22:25   Dg Chest Port 1 View  Result Date: 03/17/2018 CLINICAL DATA:  Respiratory distress.  History of prostate carcinoma EXAM: PORTABLE CHEST 1 VIEW COMPARISON:  Chest radiograph and chest CT March 15, 2018 FINDINGS: There is new airspace consolidation in the medial right base, suspicious for pneumonia. The lungs elsewhere are clear. There is cardiomegaly with  pulmonary venous hypertension. There is a small right pleural effusion. No adenopathy. There is a stimulator with lead tips at the cervical-thoracic junction. There is postoperative change in the lower cervical spine. IMPRESSION: Medial right base consolidation felt to represent pneumonia. Underlying pulmonary vascular congestion. Small right pleural effusion. Electronically Signed   By: Lowella Grip III M.D.   On: 03/17/2018 09:48   Dg Chest Portable 1 View  Result Date: 03/15/2018 CLINICAL DATA:  72 year old male status post fall getting into car today. Confusion beginning about 1630 hours. Shortness of breath. EXAM: PORTABLE CHEST 1 VIEW COMPARISON:  Portable chest 05/04/2016 and earlier. FINDINGS: Portable AP upright view at 1854 hours. Chronic cardiomegaly. Stable mediastinal contours. Visualized tracheal air column is within normal limits. Mild respiratory motion artifact. Suggestion of increased pulmonary vascularity compared to 2017. No pneumothorax, pleural effusion or consolidation is evident. Prior cervical spine surgery with spinal stimulator device coursing to the cervical region. No acute osseous abnormality identified. Negative visible bowel gas pattern. IMPRESSION: Cardiomegaly with appearance of increased pulmonary vascularity from prior studies. Consider acute pulmonary edema. Electronically Signed   By: Genevie Ann M.D.   On: 03/15/2018 19:09    Orson Eva, DO  [facility] Pager [phone]  If 7PM-7AM, please contact night-coverage [email or web address] Password [email or web address] 03/21/2018, 10:24 AM   LOS: 5 days

## 2018-03-21 NOTE — CV Procedure (Signed)
TEE performed without complications.  Sedation provided by anesthesia service with use of propofol.  Findings are as follows:  Study Conclusions  - Procedure narrative: Transesophageal echocardiography. Topical   anesthesia was obtained using viscous lidocaine. A   transesophageal probe was inserted by the attending cardiologist.   Image quality was poor. This was a technically difficult study. - Left ventricle: Systolic function was normal. The estimated   ejection fraction was in the range of 55% to 60%. - Descending aorta: The descending aorta had moderate diffuse   disease. - Mitral valve: There was trivial regurgitation. - Left atrium: No evidence of thrombus in the atrial cavity or   appendage. - Tricuspid valve: There was trivial regurgitation. - Pulmonic valve: There was trivial regurgitation.  Impressions:  - Images were overall limited. Valves best visualized with   nonstandard views. There were no obvious valvular vegetations.

## 2018-03-21 NOTE — Addendum Note (Signed)
Addendum  created 03/21/18 1253 by Mickel Baas, CRNA   Intraprocedure Event edited

## 2018-03-21 NOTE — Progress Notes (Signed)
*  PRELIMINARY RESULTS* Echocardiogram Echocardiogram Transesophageal has been performed.  Cody Velez 03/21/2018, 1:29 PM

## 2018-03-22 ENCOUNTER — Inpatient Hospital Stay: Payer: Self-pay

## 2018-03-22 ENCOUNTER — Inpatient Hospital Stay (HOSPITAL_COMMUNITY): Payer: Medicare Other

## 2018-03-22 DIAGNOSIS — R29898 Other symptoms and signs involving the musculoskeletal system: Secondary | ICD-10-CM

## 2018-03-22 DIAGNOSIS — E7800 Pure hypercholesterolemia, unspecified: Secondary | ICD-10-CM

## 2018-03-22 DIAGNOSIS — Z8679 Personal history of other diseases of the circulatory system: Secondary | ICD-10-CM

## 2018-03-22 DIAGNOSIS — M549 Dorsalgia, unspecified: Secondary | ICD-10-CM

## 2018-03-22 DIAGNOSIS — Z9689 Presence of other specified functional implants: Secondary | ICD-10-CM

## 2018-03-22 DIAGNOSIS — G9340 Encephalopathy, unspecified: Secondary | ICD-10-CM

## 2018-03-22 DIAGNOSIS — G8929 Other chronic pain: Secondary | ICD-10-CM

## 2018-03-22 DIAGNOSIS — M4644 Discitis, unspecified, thoracic region: Secondary | ICD-10-CM

## 2018-03-22 LAB — BLOOD GAS, ARTERIAL
Acid-Base Excess: 7.1 mmol/L — ABNORMAL HIGH (ref 0.0–2.0)
Bicarbonate: 30.5 mmol/L — ABNORMAL HIGH (ref 20.0–28.0)
Drawn by: 365291
FIO2: 21
O2 Saturation: 93.5 %
Patient temperature: 98.6
pCO2 arterial: 38.5 mmHg (ref 32.0–48.0)
pH, Arterial: 7.51 — ABNORMAL HIGH (ref 7.350–7.450)
pO2, Arterial: 66.6 mmHg — ABNORMAL LOW (ref 83.0–108.0)

## 2018-03-22 LAB — AMMONIA: Ammonia: 28 umol/L (ref 9–35)

## 2018-03-22 MED ORDER — ORAL CARE MOUTH RINSE
15.0000 mL | Freq: Two times a day (BID) | OROMUCOSAL | Status: DC
Start: 2018-03-23 — End: 2018-03-30
  Administered 2018-03-23 – 2018-03-30 (×11): 15 mL via OROMUCOSAL

## 2018-03-22 MED ORDER — SODIUM CHLORIDE 0.9% FLUSH
10.0000 mL | INTRAVENOUS | Status: DC | PRN
Start: 2018-03-22 — End: 2018-03-30
  Administered 2018-03-24: 20 mL
  Filled 2018-03-22: qty 40

## 2018-03-22 MED ORDER — IOPAMIDOL (ISOVUE-300) INJECTION 61%
INTRAVENOUS | Status: AC
  Administered 2018-03-22: 75 mL
  Filled 2018-03-22: qty 100

## 2018-03-22 MED ORDER — SODIUM CHLORIDE 0.9% FLUSH
10.0000 mL | Freq: Two times a day (BID) | INTRAVENOUS | Status: DC
  Administered 2018-03-22 – 2018-03-29 (×8): 10 mL
  Administered 2018-03-29: 20 mL

## 2018-03-22 MED ORDER — CHLORHEXIDINE GLUCONATE 0.12 % MT SOLN
15.0000 mL | Freq: Two times a day (BID) | OROMUCOSAL | Status: DC
  Administered 2018-03-22 – 2018-03-30 (×13): 15 mL via OROMUCOSAL
  Filled 2018-03-22 (×14): qty 15

## 2018-03-22 MED ORDER — POTASSIUM CHLORIDE CRYS ER 20 MEQ PO TBCR
20.0000 meq | EXTENDED_RELEASE_TABLET | Freq: Two times a day (BID) | ORAL | Status: DC
  Administered 2018-03-22 – 2018-03-29 (×13): 20 meq via ORAL
  Filled 2018-03-22 (×14): qty 1

## 2018-03-22 NOTE — Progress Notes (Signed)
Pt was placed on his home CPAP at this time.

## 2018-03-22 NOTE — Progress Notes (Signed)
Patient admitted from [name] to room 5w18. Alert/oriented x4. Room air, respirations even unlabored. Heart rate regular with S1S2 noted. Weakness and limited movement noted to bilateral legs. Skin warm dry intact, bruising to bilateral arms. Complains of pain 9/10, managed with oxycodone. Patient and girlfriend at bedside educated on high fall risk and oriented to room.

## 2018-03-22 NOTE — Progress Notes (Addendum)
[facility] for [facility]  Date of Admission:  03/15/2018             ASSESSMENT/PLAN  MSSA bacteremia of undetermined source with negative TEE. Source of infection may be spinal stimulator, however MRI was unable to be completed secondary to restrictions of the spinal stimulator and CT scan with concern for T10-11 area with possible fracture, bone contusion or infection.  It may be reasonable to consider IR biopsy/drainage now that MRI is not available, however treating with 6 weeks of Ancef would be empiric. Repeat blood cultures from 3/30 remain with no growth to date. Has completed 7 days of antibiotic therapy and now remains on Ancef and has been afebrile with no adverse side effects. Currently awaiting PICC.   1. Continue Ancef for with goal treatment of 6 weeks as there is no definitive source and concern for the back.  2. Agree with IR biopsy/drainage if symptoms of infection worsen or return.  3. Will check CT scan of the neck to evaluate simulator.  3. Will check Hepatitis C antibody for general screening as he is born between [place].    Principal Problem:   Bacteremia due to methicillin susceptible Staphylococcus aureus (MSSA) Active Problems:   HYPERCHOLESTEROLEMIA   Essential hypertension, benign   CAD, NATIVE VESSEL   GERD   Chronic back pain   History of cerebrovascular disease   Weakness   Fever   Sepsis ([place])   Nephrolithiasis   AKI (acute kidney injury) ([place])   Acute metabolic encephalopathy   Cognitive impairment   Chronic pain syndrome   Obesity   Staphylococcus aureus sepsis ([place])   . amLODipine  10 mg Oral Daily  . aspirin EC  81 mg Oral Daily  . cycloSPORINE  1 drop Both Eyes BID  . donepezil  10 mg Oral QHS  . enoxaparin (LOVENOX) injection  40 mg Subcutaneous Q24H  . finasteride  5 mg Oral Daily  . gabapentin  600 mg Oral TID  . ipratropium-albuterol  3 mL Nebulization BID  . latanoprost  1 drop Both Eyes QHS  . memantine   10 mg Oral BID  . pantoprazole  40 mg Oral Daily  . potassium chloride  20 mEq Oral BID  . pravastatin  10 mg Oral QPM  . saccharomyces boulardii  250 mg Oral BID WC  . traZODone  100 mg Oral QHS  . venlafaxine XR  150 mg Oral BID  . vitamin B-12  1,000 mcg Oral Daily    SUBJECTIVE:  Transferred from [facility] to [facility]. Afebrile overnight. Unable to complete MRI secondary to restrictions on spinal stimulator. He continues to receive Ancef for MSSA infection. TEE performed reviewed and with no evidence of vegetation.   No Known Allergies   Review of Systems: Review of Systems  Constitutional: Negative for chills, diaphoresis, fever and malaise/fatigue.  Respiratory: Negative for cough, shortness of breath and wheezing.   Cardiovascular: Negative for chest pain.  Musculoskeletal: Positive for back pain.  Skin: Negative for rash.      OBJECTIVE: Vitals:   03/21/18 2117 03/21/18 2336 03/22/18 0522 03/22/18 0523  BP: (!) 141/74   (!) 157/94  Pulse: 76   75  Resp: 17   18  Temp: 99.5 F (37.5 C)   98.5 F (36.9 C)  TempSrc: Oral     SpO2: 97% 93%  95%  Weight:   299 lb 13.2 oz (136 kg)   Height:  Body mass index is 37.48 kg/m.  Physical Exam  Constitutional: He is oriented to person, place, and time and well-developed, well-nourished, and in no distress. No distress.  Lethargic/drowsy to verbal stimulation  Cardiovascular: Normal rate, regular rhythm and intact distal pulses. Exam reveals no gallop and no friction rub.  No murmur heard. Pulmonary/Chest: Effort normal and breath sounds normal. No respiratory distress. He has no wheezes. He has no rales. He exhibits no tenderness.  Musculoskeletal:  Weakness of left lower extremity compared to the right (patient notes weakness in both extremities). Pulses are intact and appropriate. Tactile sensation appears intact.   Neurological: He is alert and oriented to person, place, and time.  Skin: No rash noted.     Lab Results Lab Results  Component Value Date   WBC 12.7 (H) 03/21/2018   HGB 11.4 (L) 03/21/2018   HCT 37.0 (L) 03/21/2018   MCV 83.9 03/21/2018   PLT 547 (H) 03/21/2018    Lab Results  Component Value Date   CREATININE 0.74 03/21/2018   BUN 13 03/21/2018   NA 136 03/21/2018   K 3.4 (L) 03/21/2018   CL 95 (L) 03/21/2018   CO2 31 03/21/2018    Lab Results  Component Value Date   ALT 46 03/20/2018   AST 65 (H) 03/20/2018   ALKPHOS 101 03/20/2018   BILITOT 1.1 03/20/2018     Microbiology: Recent Results (from the past 240 hour(s))  Urine culture     Status: Abnormal   Collection Time: 03/15/18  6:30 PM  Result Value Ref Range Status   Specimen Description   Final    URINE, RANDOM Performed at [facility], [address] [place], [place] [address]    Special Requests   Final    NONE Performed at [facility], [address], [place], [place] [address]    Culture (A)  Final    <10,000 COLONIES/mL INSIGNIFICANT GROWTH Performed at [facility], [place] [address] [place], [place] [address]    Report Status 03/17/2018 FINAL  Final  Culture, blood (routine x 2)     Status: Abnormal   Collection Time: 03/15/18  8:00 PM  Result Value Ref Range Status   Specimen Description   Final    BLOOD RIGHT WRIST Performed at [facility], [place] [address], [place], [place] [address]    Special Requests   Final    BOTTLES DRAWN AEROBIC AND ANAEROBIC Blood Culture adequate volume Performed at [facility], [address] [place], [place] [address]    Culture  Setup Time   Final    GRAM POSITIVE COCCI IN BOTH AEROBIC AND ANAEROBIC BOTTLES Performed at [facility] Gram Stain Report Called to,Read Back By and Verified With: JOHNSON,[email or web address]  BY MATTHEWS, B 3.30.19 CRITICAL RESULT CALLED TO, READ BACK BY AND VERIFIED WITH: NATE HAYES,PHARMD [id number] BY SJW Performed at [facility], [place] [address] [place],  [address]    Culture STAPHYLOCOCCUS  AUREUS (A)  Final   Report Status 03/20/2018 FINAL  Final   Organism ID, Bacteria STAPHYLOCOCCUS AUREUS  Final      Susceptibility   Staphylococcus aureus - MIC*    CIPROFLOXACIN <=0.5 SENSITIVE Sensitive     ERYTHROMYCIN 0.5 SENSITIVE Sensitive     GENTAMICIN <=0.5 SENSITIVE Sensitive     OXACILLIN <=0.25 SENSITIVE Sensitive     TETRACYCLINE <=1 SENSITIVE Sensitive     VANCOMYCIN <=0.5 SENSITIVE Sensitive     TRIMETH/SULFA <=10 SENSITIVE Sensitive  CLINDAMYCIN <=0.25 SENSITIVE Sensitive     RIFAMPIN <=0.5 SENSITIVE Sensitive     Inducible Clindamycin NEGATIVE Sensitive     * STAPHYLOCOCCUS AUREUS  Blood Culture ID Panel (Reflexed)     Status: Abnormal   Collection Time: 03/15/18  8:00 PM  Result Value Ref Range Status   Enterococcus species NOT DETECTED NOT DETECTED Final   Listeria monocytogenes NOT DETECTED NOT DETECTED Final   Staphylococcus species DETECTED (A) NOT DETECTED Final    Comment: CRITICAL RESULT CALLED TO, READ BACK BY AND VERIFIED WITH: NATE HAYES PHARMD AT 0954 ON [id number] BY SJW    Staphylococcus aureus DETECTED (A) NOT DETECTED Final    Comment: CRITICAL RESULT CALLED TO, READ BACK BY AND VERIFIED WITH: NATE HAYES PHARMD AT 0954 ON [id number] BY SJW    Methicillin resistance NOT DETECTED NOT DETECTED Final   Streptococcus species NOT DETECTED NOT DETECTED Final   Streptococcus agalactiae NOT DETECTED NOT DETECTED Final   Streptococcus pneumoniae NOT DETECTED NOT DETECTED Final   Streptococcus pyogenes NOT DETECTED NOT DETECTED Final   Acinetobacter baumannii NOT DETECTED NOT DETECTED Final   Enterobacteriaceae species NOT DETECTED NOT DETECTED Final   Enterobacter cloacae complex NOT DETECTED NOT DETECTED Final   Escherichia coli NOT DETECTED NOT DETECTED Final   Klebsiella oxytoca NOT DETECTED NOT DETECTED Final   Klebsiella pneumoniae NOT DETECTED NOT DETECTED Final   Proteus species NOT DETECTED NOT DETECTED Final   Serratia marcescens NOT DETECTED NOT  DETECTED Final   Haemophilus influenzae NOT DETECTED NOT DETECTED Final   Neisseria meningitidis NOT DETECTED NOT DETECTED Final   Pseudomonas aeruginosa NOT DETECTED NOT DETECTED Final   Candida albicans NOT DETECTED NOT DETECTED Final   Candida glabrata NOT DETECTED NOT DETECTED Final   Candida krusei NOT DETECTED NOT DETECTED Final   Candida parapsilosis NOT DETECTED NOT DETECTED Final   Candida tropicalis NOT DETECTED NOT DETECTED Final    Comment: Performed at [facility], [address] [place], [place] [address]  Culture, blood (routine x 2)     Status: None   Collection Time: 03/15/18  8:21 PM  Result Value Ref Range Status   Specimen Description BLOOD LEFT HAND  Final   Special Requests   Final    BOTTLES DRAWN AEROBIC AND ANAEROBIC Blood Culture adequate volume   Culture   Final    NO GROWTH 5 DAYS Performed at [facility], [address], [place],  [address]    Report Status 03/20/2018 FINAL  Final  Respiratory Panel by PCR     Status: None   Collection Time: 03/16/18 11:40 AM  Result Value Ref Range Status   Adenovirus NOT DETECTED NOT DETECTED Final   Coronavirus 229E NOT DETECTED NOT DETECTED Final   Coronavirus HKU1 NOT DETECTED NOT DETECTED Final   Coronavirus NL63 NOT DETECTED NOT DETECTED Final   Coronavirus OC43 NOT DETECTED NOT DETECTED Final   Metapneumovirus NOT DETECTED NOT DETECTED Final   Rhinovirus / Enterovirus NOT DETECTED NOT DETECTED Final   Influenza A NOT DETECTED NOT DETECTED Final   Influenza B NOT DETECTED NOT DETECTED Final   Parainfluenza Virus 1 NOT DETECTED NOT DETECTED Final   Parainfluenza Virus 2 NOT DETECTED NOT DETECTED Final   Parainfluenza Virus 3 NOT DETECTED NOT DETECTED Final   Parainfluenza Virus 4 NOT DETECTED NOT DETECTED Final   Respiratory Syncytial Virus NOT DETECTED NOT DETECTED Final   Bordetella pertussis NOT DETECTED NOT DETECTED Final   Chlamydophila pneumoniae NOT  DETECTED NOT DETECTED Final    Mycoplasma pneumoniae NOT DETECTED NOT DETECTED Final    Comment: Performed at [facility], [place] [address], [place], [place] [address]  Culture, blood (routine x 2)     Status: None (Preliminary result)   Collection Time: 03/18/18 12:38 PM  Result Value Ref Range Status   Specimen Description LEFT ANTECUBITAL  Final   Special Requests   Final    BOTTLES DRAWN AEROBIC AND ANAEROBIC Blood Culture adequate volume   Culture   Final    NO GROWTH 4 DAYS Performed at [facility], [address] [place], [place] [address]    Report Status PENDING  Incomplete  Culture, blood (routine x 2)     Status: None (Preliminary result)   Collection Time: 03/18/18 12:45 PM  Result Value Ref Range Status   Specimen Description BLOOD LEFT HAND  Final   Special Requests   Final    BOTTLES DRAWN AEROBIC AND ANAEROBIC Blood Culture results may not be optimal due to an inadequate volume of blood received in culture bottles   Culture   Final    NO GROWTH 4 DAYS Performed at [facility], [address] [place], [place] [address]    Report Status PENDING  Incomplete  C difficile quick scan w PCR reflex     Status: None   Collection Time: 03/19/18  5:04 PM  Result Value Ref Range Status   C Diff antigen NEGATIVE NEGATIVE Final   C Diff toxin NEGATIVE NEGATIVE Final   C Diff interpretation No C. difficile detected.  Final    Comment: VALID Performed at [facility], [address] [place], [place] [address]   Gastrointestinal Panel by PCR , Stool     Status: None   Collection Time: 03/19/18  5:04 PM  Result Value Ref Range Status   Campylobacter species NOT DETECTED NOT DETECTED Final   Plesimonas shigelloides NOT DETECTED NOT DETECTED Final   Salmonella species NOT DETECTED NOT DETECTED Final   Yersinia enterocolitica NOT DETECTED NOT DETECTED Final   Vibrio species NOT DETECTED NOT DETECTED Final   Vibrio cholerae NOT DETECTED NOT DETECTED Final   Enteroaggregative E coli (EAEC) NOT DETECTED  NOT DETECTED Final   Enteropathogenic E coli (EPEC) NOT DETECTED NOT DETECTED Final   Enterotoxigenic E coli (ETEC) NOT DETECTED NOT DETECTED Final   Shiga like toxin producing E coli (STEC) NOT DETECTED NOT DETECTED Final   Shigella/Enteroinvasive E coli (EIEC) NOT DETECTED NOT DETECTED Final   Cryptosporidium NOT DETECTED NOT DETECTED Final   Cyclospora cayetanensis NOT DETECTED NOT DETECTED Final   Entamoeba histolytica NOT DETECTED NOT DETECTED Final   Giardia lamblia NOT DETECTED NOT DETECTED Final   Adenovirus F40/41 NOT DETECTED NOT DETECTED Final   Astrovirus NOT DETECTED NOT DETECTED Final   Norovirus GI/GII NOT DETECTED NOT DETECTED Final   Rotavirus A NOT DETECTED NOT DETECTED Final   Sapovirus (I, II, IV, and V) NOT DETECTED NOT DETECTED Final    Comment: Performed at [facility], [address], [place], [place] [address]     Terri Piedra, NP [facility] [phone] Pager  03/22/2018  8:17 AM

## 2018-03-22 NOTE — Progress Notes (Signed)
Pt has his home cpap set up at the bedside.

## 2018-03-22 NOTE — Progress Notes (Addendum)
Progress Note    Cody Velez  URK:[id number] DOB: 1946-04-02  DOA: 03/15/2018 PCP: Asencion Noble, MD    Brief Narrative:   Chief complaint: Follow-up sepsis  Medical records reviewed and are as summarized below:  Cody Velez is an 72 y.o. male with a PMH of hypertension, hyperlipidemia, morbid obesity, CAD, chronic back pain status post spinal stimulator placed 01/18/18 who was admitted 03/15/18 for evaluation of worsening confusion and a mechanical fall at home. For summary of his presentation, please see Dr. Doristine Devoid excellent review. Upon initial evaluation in the ED, patient was noted to have a fever of 102.42F and was tachycardic. Blood cultures ultimately positive for MSSA. Patient was transferred from [facility] to [facility] to pursue MRI/ID consultation. Source of sepsis thought to be from spinal cord stimulator versus pneumonia.  Assessment/Plan:   Principal Problem:   Sepsis and Bacteremia due to methicillin susceptible Staphylococcus aureus (MSSA)/fever Potential etiologies include pneumonia versus spinal cord stimulator spinal infection. Evaluated by ID. Cultures on admission positive for MSSA in 1 of 2 bottles. Repeat surveillance cultures negative. No evidence of endocarditis on TEE. ID does recommend cefazolin through 03/29/18. At this point, there does not appear to be any evidence that the infection is from his spinal cord stimulator based on CT scan findings. MRI not felt to be safe given stimulator. Dr. Carles Collet did speak with Dr. Cyndy Freeze who recommended IR biopsy/drainage of T10-T11 paravertebral area and/or myelogram if there is ongoing clinical concern for infection. CT of the cervical spine ordered to rule out infection in the area of the spinal cord stimulator. We'll place a PICC line for prolonged antibiotics.  Active Problems:   Acute respiratory failure with hypoxia secondary to pulmonary edema/lobar pneumonia Chest x-ray showed increased interstitial markings and a right lower  lobe opacity. Symptoms improved with Lasix. Continue antibiotics to treat pneumonia.    Myoclonus  Thought to be from polypharmacy including gabapentin in the setting of renal failure. EEG was normal. Gabapentin dose reduced.    Transaminitis  Thought to be from sepsis.    Depression  Continue venlafaxine. Trazodone on hold.    HYPERCHOLESTEROLEMIA Continue Pravachol.    Essential hypertension, benign Continue Norvasc and hydralazine as needed. Blood pressure 140s-150s/70s-90s. Losartan on hold. Can resume if needed.    CAD, NATIVE VESSEL Continue aspirin and statin. EKG on admission negative for ischemic changes.    GERD Continue Protonix.    Chronic back pain/chronic pain syndrome History of lumbar fusion per Dr. Ellene Route. Status post spinal cord stimulator for management. Continue Neurontin and Percocet as needed.    History of cerebrovascular disease Continue aspirin/statin.    Weakness PT for evaluation.    Hypokalemia Place on routine potassium supplementation.    AKI (acute kidney injury) ([place]) Baseline creatinine 1.0-1.2 with presenting creatinine of 2.09. Felt to be secondary to sepsis/volume depletion in the setting of NSAID use. Creatinine now 0.74 with a normal GFR.    Acute metabolic encephalopathy Thought to be from sepsis. CT of the head was negative. Had mild elevation of his ammonia on admission but this subsequently normalized. Given recurrent somnolence, will repeat ammonia level and obtain an ABG to rule out CO2 narcosis.    Cognitive impairment Continue Aricept and Namenda.    Obesity Body mass index is 37.48 kg/m.   Family Communication/Anticipated D/C date and plan/Code Status   DVT prophylaxis: Lovenox ordered. Code Status: Full Code.  Family Communication: Daughter updated by telephone. Disposition Plan: SNF placement if  no deep infection found on CT of cervical spine once PICC line placed.   Medical Consultants:    Infectious  Disease  Cardiology   Anti-Infectives:    Zosyn 3/27>>>3/30  vanco 4/27>>>3/30  Cefazolin 3/30>>>   Subjective:   Patient seems a little more somnolent than he was yesterday per his significant other who was sitting at the bedside. He quickly falls asleep when I try to awaken him. Has had cervical pain but otherwise no other specific complaints.  Objective:    Vitals:   03/21/18 2117 03/21/18 2336 03/22/18 0522 03/22/18 0523  BP: (!) 141/74   (!) 157/94  Pulse: 76   75  Resp: 17   18  Temp: 99.5 F (37.5 C)   98.5 F (36.9 C)  TempSrc: Oral     SpO2: 97% 93%  95%  Weight:   136 kg (299 lb 13.2 oz)   Height:        Intake/Output Summary (Last 24 hours) at 03/22/2018 0752 Last data filed at 03/22/2018 0525 Gross per 24 hour  Intake 400 ml  Output 1550 ml  Net -1150 ml   Filed Weights   03/21/18 0623 03/21/18 1931 03/22/18 0522  Weight: (!) 137.8 kg (303 lb 12.7 oz) 134.8 kg (297 lb 2.9 oz) 136 kg (299 lb 13.2 oz)    Exam: General: Obese male who is somnolent. Cardiovascular: Heart sounds show a regular rate, and rhythm. No gallops or rubs. No murmurs. No JVD. Lungs: Clear to auscultation bilaterally with decreased breath sounds. No rales, rhonchi or wheezes. Abdomen: Soft, nontender, nondistended with normal active bowel sounds. No masses. No hepatosplenomegaly. Neurological: Somnolent. Skin: Warm and dry. No rashes or lesions. Extremities: No clubbing or cyanosis. No edema. Pedal pulses 2+. Psychiatric: Mood, affect, nsight and judgment are difficult to assess secondary to somnolence.  Data Reviewed:   I have personally reviewed following labs and imaging studies:  Labs: Labs show the following:   Basic Metabolic Panel: Recent Labs  Lab 03/16/18 0429 03/16/18 0826 03/17/18 0609 03/19/18 0642 03/20/18 0703 03/21/18 0449  NA 137  --  135 132* 134* 136  K 3.3*  --  3.8 2.9* 3.3* 3.4*  CL 101  --  99* 92* 95* 95*  CO2 23  --  25 28 29 31   GLUCOSE  131*  --  108* 116* 92 96  BUN 25*  --  20 16 17 13   CREATININE 1.53*  --  1.02 0.77 0.81 0.74  CALCIUM 8.6*  --  8.4* 8.6* 8.5* 8.2*  MG  --  2.0 2.2 1.9 2.0  --    GFR Estimated Creatinine Clearance: 125.9 mL/min (by C-G formula based on SCr of 0.74 mg/dL). Liver Function Tests: Recent Labs  Lab 03/15/18 1859 03/17/18 0609 03/19/18 0642 03/20/18 0703  AST 50* 47* 70* 65*  ALT 55 41 51 46  ALKPHOS 118 109 115 101  BILITOT 1.4* 0.9 1.1 1.1  PROT 7.7 6.7 6.8 6.9  ALBUMIN 2.7* 2.3* 2.0* 2.0*    Recent Labs  Lab 03/19/18 0642 03/20/18 0703  AMMONIA 36* 32   CBC: Recent Labs  Lab 03/15/18 1859 03/16/18 0429 03/17/18 0609 03/19/18 0642 03/20/18 0703 03/21/18 0449  WBC 25.7* 22.9* 18.2* 17.6* 14.5* 12.7*  NEUTROABS 24.2*  --   --   --   --   --   HGB 11.4* 10.7* 11.0* 11.5* 10.9* 11.4*  HCT 35.3* 33.7* 34.7* 35.9* 35.2* 37.0*  MCV 83.6 83.4 84.4 81.0 83.4 83.9  PLT 435* 428* 410* 481* 486* 547*   Cardiac Enzymes: Recent Labs  Lab 03/15/18 1859 03/16/18 0826 03/17/18 0609  CKTOTAL  --  438* 113  TROPONINI <0.03  --   --    Sepsis Labs: Recent Labs  Lab 03/15/18 2006 03/16/18 0429 03/16/18 0826 03/17/18 0609 03/18/18 0726 03/19/18 0642 03/20/18 0703 03/21/18 0449  PROCALCITON  --   --  2.15 1.28 0.77  --   --   --   WBC  --  22.9*  --  18.2*  --  17.6* 14.5* 12.7*  LATICACIDVEN 1.84 1.1  --   --   --   --   --   --     Microbiology Recent Results (from the past 240 hour(s))  Urine culture     Status: Abnormal   Collection Time: 03/15/18  6:30 PM  Result Value Ref Range Status   Specimen Description   Final    URINE, RANDOM Performed at [facility], [address] [place], [place] [address]    Special Requests   Final    NONE Performed at [facility], [address] [place], [place] [address]    Culture (A)  Final    <10,000 COLONIES/mL INSIGNIFICANT GROWTH Performed at [facility], [place] [address], [place], [place] [address]     Report Status 03/17/2018 FINAL  Final  Culture, blood (routine x 2)     Status: Abnormal   Collection Time: 03/15/18  8:00 PM  Result Value Ref Range Status   Specimen Description   Final    BLOOD RIGHT WRIST Performed at [facility], [place] [address], [place], [place] [address]    Special Requests   Final    BOTTLES DRAWN AEROBIC AND ANAEROBIC Blood Culture adequate volume Performed at [facility], [address] [place], [place] [address]    Culture  Setup Time   Final    GRAM POSITIVE COCCI IN BOTH AEROBIC AND ANAEROBIC BOTTLES Performed at [facility] Gram Stain Report Called to,Read Back By and Verified With: JOHNSON,[id number]  BY MATTHEWS, B 3.30.19 CRITICAL RESULT CALLED TO, READ BACK BY AND VERIFIED WITH: NATE HAYES,PHARMD [id number] BY SJW Performed at [facility], [place] [address], [place], [place] [address]    Culture STAPHYLOCOCCUS AUREUS (A)  Final   Report Status 03/20/2018 FINAL  Final   Organism ID, Bacteria STAPHYLOCOCCUS AUREUS  Final      Susceptibility   Staphylococcus aureus - MIC*    CIPROFLOXACIN <=0.5 SENSITIVE Sensitive     ERYTHROMYCIN 0.5 SENSITIVE Sensitive     GENTAMICIN <=0.5 SENSITIVE Sensitive     OXACILLIN <=0.25 SENSITIVE Sensitive     TETRACYCLINE <=1 SENSITIVE Sensitive     VANCOMYCIN <=0.5 SENSITIVE Sensitive     TRIMETH/SULFA <=10 SENSITIVE Sensitive     CLINDAMYCIN <=0.25 SENSITIVE Sensitive     RIFAMPIN <=0.5 SENSITIVE Sensitive     Inducible Clindamycin NEGATIVE Sensitive     * STAPHYLOCOCCUS AUREUS  Blood Culture ID Panel (Reflexed)     Status: Abnormal   Collection Time: 03/15/18  8:00 PM  Result Value Ref Range Status   Enterococcus species NOT DETECTED NOT DETECTED Final   Listeria monocytogenes NOT DETECTED NOT DETECTED Final   Staphylococcus species DETECTED (A) NOT DETECTED Final    Comment: CRITICAL RESULT CALLED TO, READ BACK BY AND VERIFIED WITH: NATE HAYES PHARMD AT 0954 ON [id number] BY SJW    Staphylococcus  aureus DETECTED (A) NOT DETECTED  Final    Comment: CRITICAL RESULT CALLED TO, READ BACK BY AND VERIFIED WITH: NATE HAYES PHARMD AT 0954 ON [id number] BY SJW    Methicillin resistance NOT DETECTED NOT DETECTED Final   Streptococcus species NOT DETECTED NOT DETECTED Final   Streptococcus agalactiae NOT DETECTED NOT DETECTED Final   Streptococcus pneumoniae NOT DETECTED NOT DETECTED Final   Streptococcus pyogenes NOT DETECTED NOT DETECTED Final   Acinetobacter baumannii NOT DETECTED NOT DETECTED Final   Enterobacteriaceae species NOT DETECTED NOT DETECTED Final   Enterobacter cloacae complex NOT DETECTED NOT DETECTED Final   Escherichia coli NOT DETECTED NOT DETECTED Final   Klebsiella oxytoca NOT DETECTED NOT DETECTED Final   Klebsiella pneumoniae NOT DETECTED NOT DETECTED Final   Proteus species NOT DETECTED NOT DETECTED Final   Serratia marcescens NOT DETECTED NOT DETECTED Final   Haemophilus influenzae NOT DETECTED NOT DETECTED Final   Neisseria meningitidis NOT DETECTED NOT DETECTED Final   Pseudomonas aeruginosa NOT DETECTED NOT DETECTED Final   Candida albicans NOT DETECTED NOT DETECTED Final   Candida glabrata NOT DETECTED NOT DETECTED Final   Candida krusei NOT DETECTED NOT DETECTED Final   Candida parapsilosis NOT DETECTED NOT DETECTED Final   Candida tropicalis NOT DETECTED NOT DETECTED Final    Comment: Performed at [facility], [address] [place], [place] [address]  Culture, blood (routine x 2)     Status: None   Collection Time: 03/15/18  8:21 PM  Result Value Ref Range Status   Specimen Description BLOOD LEFT HAND  Final   Special Requests   Final    BOTTLES DRAWN AEROBIC AND ANAEROBIC Blood Culture adequate volume   Culture   Final    NO GROWTH 5 DAYS Performed at [facility], [address] [place], [place] [address]    Report Status 03/20/2018 FINAL  Final  Respiratory Panel by PCR     Status: None   Collection Time: 03/16/18 11:40 AM  Result Value Ref  Range Status   Adenovirus NOT DETECTED NOT DETECTED Final   Coronavirus 229E NOT DETECTED NOT DETECTED Final   Coronavirus HKU1 NOT DETECTED NOT DETECTED Final   Coronavirus NL63 NOT DETECTED NOT DETECTED Final   Coronavirus OC43 NOT DETECTED NOT DETECTED Final   Metapneumovirus NOT DETECTED NOT DETECTED Final   Rhinovirus / Enterovirus NOT DETECTED NOT DETECTED Final   Influenza A NOT DETECTED NOT DETECTED Final   Influenza B NOT DETECTED NOT DETECTED Final   Parainfluenza Virus 1 NOT DETECTED NOT DETECTED Final   Parainfluenza Virus 2 NOT DETECTED NOT DETECTED Final   Parainfluenza Virus 3 NOT DETECTED NOT DETECTED Final   Parainfluenza Virus 4 NOT DETECTED NOT DETECTED Final   Respiratory Syncytial Virus NOT DETECTED NOT DETECTED Final   Bordetella pertussis NOT DETECTED NOT DETECTED Final   Chlamydophila pneumoniae NOT DETECTED NOT DETECTED Final   Mycoplasma pneumoniae NOT DETECTED NOT DETECTED Final    Comment: Performed at [facility], [place] [address], [place], [place] [address]  Culture, blood (routine x 2)     Status: None (Preliminary result)   Collection Time: 03/18/18 12:38 PM  Result Value Ref Range Status   Specimen Description LEFT ANTECUBITAL  Final   Special Requests   Final    BOTTLES DRAWN AEROBIC AND ANAEROBIC Blood Culture adequate volume   Culture   Final    NO GROWTH 4 DAYS Performed at [facility], [address] [place], [place] [address]    Report  Status PENDING  Incomplete  Culture, blood (routine x 2)     Status: None (Preliminary result)   Collection Time: 03/18/18 12:45 PM  Result Value Ref Range Status   Specimen Description BLOOD LEFT HAND  Final   Special Requests   Final    BOTTLES DRAWN AEROBIC AND ANAEROBIC Blood Culture results may not be optimal due to an inadequate volume of blood received in culture bottles   Culture   Final    NO GROWTH 4 DAYS Performed at [facility], [address] [place], [place] [address]    Report Status  PENDING  Incomplete  C difficile quick scan w PCR reflex     Status: None   Collection Time: 03/19/18  5:04 PM  Result Value Ref Range Status   C Diff antigen NEGATIVE NEGATIVE Final   C Diff toxin NEGATIVE NEGATIVE Final   C Diff interpretation No C. difficile detected.  Final    Comment: VALID Performed at [facility], [address] [place], [place] [address]   Gastrointestinal Panel by PCR , Stool     Status: None   Collection Time: 03/19/18  5:04 PM  Result Value Ref Range Status   Campylobacter species NOT DETECTED NOT DETECTED Final   Plesimonas shigelloides NOT DETECTED NOT DETECTED Final   Salmonella species NOT DETECTED NOT DETECTED Final   Yersinia enterocolitica NOT DETECTED NOT DETECTED Final   Vibrio species NOT DETECTED NOT DETECTED Final   Vibrio cholerae NOT DETECTED NOT DETECTED Final   Enteroaggregative E coli (EAEC) NOT DETECTED NOT DETECTED Final   Enteropathogenic E coli (EPEC) NOT DETECTED NOT DETECTED Final   Enterotoxigenic E coli (ETEC) NOT DETECTED NOT DETECTED Final   Shiga like toxin producing E coli (STEC) NOT DETECTED NOT DETECTED Final   Shigella/Enteroinvasive E coli (EIEC) NOT DETECTED NOT DETECTED Final   Cryptosporidium NOT DETECTED NOT DETECTED Final   Cyclospora cayetanensis NOT DETECTED NOT DETECTED Final   Entamoeba histolytica NOT DETECTED NOT DETECTED Final   Giardia lamblia NOT DETECTED NOT DETECTED Final   Adenovirus F40/41 NOT DETECTED NOT DETECTED Final   Astrovirus NOT DETECTED NOT DETECTED Final   Norovirus GI/GII NOT DETECTED NOT DETECTED Final   Rotavirus A NOT DETECTED NOT DETECTED Final   Sapovirus (I, II, IV, and V) NOT DETECTED NOT DETECTED Final    Comment: Performed at [facility], [address] [place], [place] [address]    Procedures and diagnostic studies:  Ct Thoracic Spine W Contrast  Result Date: 03/20/2018 CLINICAL DATA:  72 y/o M; 72 y/o M; mid and lower back pain with new leg weakness. Fall at home  03/15/2018. MSSA bacteremia. EXAM: CT THORACIC AND LUMBAR SPINE WITHOUT CONTRAST TECHNIQUE: Multidetector CT imaging of the thoracic and lumbar spine was performed without contrast. Multiplanar CT image reconstructions were also generated. COMPARISON:  03/15/2018 CT of chest, abdomen, and pelvis. FINDINGS: CT THORACIC SPINE FINDINGS Alignment: Normal. Vertebrae: No acute fracture or focal pathologic process. Multiple endplate Schmorl's nodes are stable from prior CT. No new loss of vertebral body height. Partially visualized anterior cervical discectomy and fusion. Paraspinal and other soft tissues: Spinal stimulator extending to the cervical spinal canal and above the field of view. Small bilateral pleural effusions. Paravertebral edema is present surrounding T10 and T11 vertebral bodies which may represent underlying occult fracture, bone contusion, possibly infection Disc levels: L2-3 loss of disc space height and endplate degenerative changes. T10-L1 hypertrophic facet arthropathy. CT LUMBAR SPINE FINDINGS Segmentation: 5 lumbar  type vertebrae. Alignment: Normal. Vertebrae: L1-L3 posterior instrumented fusion and interbody fusion with laminectomy. L4-S1 interbody fusion. No new loss of vertebral body height or malalignment. Hardware is intact without apparent hardware related complication or periprosthetic lucency. Paraspinal and other soft tissues: Aortic atherosclerosis. Disc levels: Endplate marginal osteophytes and facet hypertrophy results in mild bony foraminal stenosis at the left L2-3 and L3-4 levels and moderate bony foraminal stenosis at L5-S1. On the right there is mild bony foraminal stenosis at L1-2, L2-3, and L5-S1. No high-grade bony canal stenosis. IMPRESSION: No acute fracture, malalignment, or bony erosive changes identified. However, paravertebral edema is present surrounding T10 and T11 vertebral bodies which may represent underlying occult fracture, bone contusion, or possibly infection.  Consider thoracic spine MRI with and without contrast for further evaluation. These results will be called to the ordering clinician or representative by the Radiologist Assistant, and communication documented in the PACS or zVision Dashboard. Electronically Signed   By: Kristine Garbe M.D.   On: 03/20/2018 20:22   Ct Lumbar Spine W Contrast  Result Date: 03/20/2018 CLINICAL DATA:  72 y/o M; 72 y/o M; mid and lower back pain with new leg weakness. Fall at home 03/15/2018. MSSA bacteremia. EXAM: CT THORACIC AND LUMBAR SPINE WITHOUT CONTRAST TECHNIQUE: Multidetector CT imaging of the thoracic and lumbar spine was performed without contrast. Multiplanar CT image reconstructions were also generated. COMPARISON:  03/15/2018 CT of chest, abdomen, and pelvis. FINDINGS: CT THORACIC SPINE FINDINGS Alignment: Normal. Vertebrae: No acute fracture or focal pathologic process. Multiple endplate Schmorl's nodes are stable from prior CT. No new loss of vertebral body height. Partially visualized anterior cervical discectomy and fusion. Paraspinal and other soft tissues: Spinal stimulator extending to the cervical spinal canal and above the field of view. Small bilateral pleural effusions. Paravertebral edema is present surrounding T10 and T11 vertebral bodies which may represent underlying occult fracture, bone contusion, possibly infection Disc levels: L2-3 loss of disc space height and endplate degenerative changes. T10-L1 hypertrophic facet arthropathy. CT LUMBAR SPINE FINDINGS Segmentation: 5 lumbar type vertebrae. Alignment: Normal. Vertebrae: L1-L3 posterior instrumented fusion and interbody fusion with laminectomy. L4-S1 interbody fusion. No new loss of vertebral body height or malalignment. Hardware is intact without apparent hardware related complication or periprosthetic lucency. Paraspinal and other soft tissues: Aortic atherosclerosis. Disc levels: Endplate marginal osteophytes and facet hypertrophy  results in mild bony foraminal stenosis at the left L2-3 and L3-4 levels and moderate bony foraminal stenosis at L5-S1. On the right there is mild bony foraminal stenosis at L1-2, L2-3, and L5-S1. No high-grade bony canal stenosis. IMPRESSION: No acute fracture, malalignment, or bony erosive changes identified. However, paravertebral edema is present surrounding T10 and T11 vertebral bodies which may represent underlying occult fracture, bone contusion, or possibly infection. Consider thoracic spine MRI with and without contrast for further evaluation. These results will be called to the ordering clinician or representative by the Radiologist Assistant, and communication documented in the PACS or zVision Dashboard. Electronically Signed   By: Kristine Garbe M.D.   On: 03/20/2018 20:22    Medications:   . amLODipine  10 mg Oral Daily  . aspirin EC  81 mg Oral Daily  . cycloSPORINE  1 drop Both Eyes BID  . donepezil  10 mg Oral QHS  . enoxaparin (LOVENOX) injection  40 mg Subcutaneous Q24H  . finasteride  5 mg Oral Daily  . gabapentin  600 mg Oral TID  . ipratropium-albuterol  3 mL Nebulization BID  . latanoprost  1 drop Both Eyes QHS  . memantine  10 mg Oral BID  . pantoprazole  40 mg Oral Daily  . pravastatin  10 mg Oral QPM  . saccharomyces boulardii  250 mg Oral BID WC  . traZODone  100 mg Oral QHS  . venlafaxine XR  150 mg Oral BID  . vitamin B-12  1,000 mcg Oral Daily   Continuous Infusions: .  ceFAZolin (ANCEF) IV Stopped (03/22/18 0300)     LOS: 6 days   Jacquelynn Cree  [facility] Pager [phone]. If unable to reach me by pager, please call my cell phone at [phone].  *Please refer to [email or web address], password [email or web address] to get updated schedule on who will round on this patient, as hospitalists switch teams weekly. If 7PM-7AM, please contact night-coverage at [email or web address], password [email or web address] for any overnight needs.  03/22/2018, 7:52 AM

## 2018-03-22 NOTE — Progress Notes (Signed)
[facility] able to accept patient on IV Ancef.   Percell Locus Logun Colavito LCSW [phone]

## 2018-03-22 NOTE — Clinical Social Work Note (Addendum)
Noted that pt transferred to [facility] for higher level of care. Pt has been referred to [facility] for STR during this stay. Kerri at [facility] had tentatively accepted pt but was waiting for confirmation of which IV antibiotic pt would be on at dc.   Message left for Kerri this AM to update on pt's transfer to [facility].   [facility] CSW can update Kerri if SNF still desired at dc.   In addition, when Marianna Fuss was working up pt for possible admission, she noted that pt's primary insurance is Medicare and secondary is the [employer]. Pt is listed here as BCBS primary Medicare secondary. Patient accounts was notified.  Will sign off.

## 2018-03-22 NOTE — Progress Notes (Signed)
Peripherally Inserted Central Catheter/Midline Placement  The IV Nurse has discussed with the patient and/or persons authorized to consent for the patient, the purpose of this procedure and the potential benefits and risks involved with this procedure.  The benefits include less needle sticks, lab draws from the catheter, and the patient may be discharged home with the catheter. Risks include, but not limited to, infection, bleeding, blood clot (thrombus formation), and puncture of an artery; nerve damage and irregular heartbeat and possibility to perform a PICC exchange if needed/ordered by physician.  Alternatives to this procedure were also discussed.  Bard Power PICC patient education guide, fact sheet on infection prevention and patient information card has been provided to patient /or left at bedside.    PICC/Midline Placement Documentation  PICC Single Lumen 63/84/53 PICC Right Basilic 44 cm 0 cm (Active)  Indication for Insertion or Continuance of Line Home intravenous therapies (PICC only) 03/22/2018  4:00 PM  Exposed Catheter (cm) 0 cm 03/22/2018  4:00 PM  Site Assessment Clean;Dry;Intact 03/22/2018  4:00 PM  Line Status Flushed;Saline locked;Blood return noted 03/22/2018  4:00 PM  Dressing Type Transparent;Securing device 03/22/2018  4:00 PM  Dressing Status Clean;Dry;Intact;Antimicrobial disc in place 03/22/2018  4:00 PM  Dressing Change Due 03/29/18 03/22/2018  4:00 PM       Frances Maywood 03/22/2018, 4:22 PM

## 2018-03-23 ENCOUNTER — Inpatient Hospital Stay: Payer: Self-pay

## 2018-03-23 DIAGNOSIS — R140 Abdominal distension (gaseous): Secondary | ICD-10-CM

## 2018-03-23 LAB — CULTURE, BLOOD (ROUTINE X 2)
Culture: NO GROWTH
Culture: NO GROWTH
Special Requests: ADEQUATE

## 2018-03-23 NOTE — Progress Notes (Signed)
PROGRESS NOTE  Cody Velez JAS:[id number] DOB: [birth year] DOA: 03/15/2018 PCP: Asencion Noble, MD  HPI/Recap of past 24 hours: Cody Velez is a 72 y.o. male with medical history significant for chronic back pain with recent spinal cord stimulator placement several weeks ago, nephrolithiasis with recent admission for UTI, hypertension, dyslipidemia, morbid obesity, and CAD who was brought to the emergency department by family members due to worsening generalized weakness that led to a fall at home.  He actually saw urology earlier today as a follow-up appointment after recent admission to [facility] in [place] where he was noted to have some bilateral nephrolithiasis.  He was apparently prescribed some doxycycline during his urology visit today, likely due to suspicion of urinary tract infection.  He has had progressive confusion throughout the day and was noted to be hypoxemic upon arrival as well as hypoxemic during his urology visit earlier. He denies any dyspnea or chest pain.  No lower extremity edema, orthopnea, or dyspnea on exertion.  He reportedly had a stress test with no concerning findings during this recent hospitalization. He complains of ongoing low back pain that is chronic for him.  03/23/2018: Patient seen and examined at his bedside.  He reports persistent lower back pain and weakness in his lower extremities.  Denies chills or night sweats.  Has no new complaints.  Assessment/Plan: Principal Problem:   Bacteremia due to methicillin susceptible Staphylococcus aureus (MSSA) Active Problems:   HYPERCHOLESTEROLEMIA   Essential hypertension, benign   CAD, NATIVE VESSEL   GERD   Chronic back pain   History of cerebrovascular disease   Weakness   Fever   Sepsis ([place])   Nephrolithiasis   AKI (acute kidney injury) ([place])   Acute metabolic encephalopathy   Cognitive impairment   Chronic pain syndrome   Obesity   Staphylococcus aureus sepsis ([facility])   Encephalopathy   Weakness of  both lower extremities   Discitis of thoracic region   Spinal cord stimulator status  Sepsis secondary to MSSA bacteremia On admission culture positive 1 out of 2 bottles for MSSA TEE unremarkable ID following Continue IV cefazolin day #1 PICC line for prolonged IV antibiotics Plan for IV cefazolin times 6 weeks Possible stimulator removal by neurosurgery Continue to monitor fever curve Continue to monitor CBC  Generalized weakness/physical debility Continue PT  Hypokalemia Potassium 3.4 repleted p.o. potassium Repeat BMP in the morning  Chronic back pain status post spinal stimulator Possible stimulator removal by neurology Continue pain management  Obesity BMI 37 Weight loss outpatient  Depression/anxiety Continue Effexor, trazodone  GERD Continue Protonix.    Code Status: Full  Family Communication: Friend at bedside  Disposition Plan: Home when clinically stable   Consultants:  Infectious disease  Procedures:  None  Antimicrobials:  IV cefazolin  DVT prophylaxis: SCDs   Objective: Vitals:   03/23/18 0546 03/23/18 0905 03/23/18 0919 03/23/18 1327  BP: 137/80 (!) 145/85  105/80  Pulse: 78   75  Resp: 18     Temp: 98.7 F (37.1 C)   98.3 F (36.8 C)  TempSrc:    Oral  SpO2: 95%  99% 100%  Weight: 136 kg (299 lb 13.2 oz)     Height:        Intake/Output Summary (Last 24 hours) at 03/23/2018 1810 Last data filed at 03/23/2018 0340 Gross per 24 hour  Intake 410 ml  Output 700 ml  Net -290 ml   Filed Weights   03/21/18 1931 03/22/18 0522 03/23/18 0546  Weight: 134.8 kg (297 lb 2.9 oz) 136 kg (299 lb 13.2 oz) 136 kg (299 lb 13.2 oz)    Exam:   General:  72 year old Caucasian male well-developed well-nourished no acute distress.  Alert and oriented x3.  Cardiovascular: Regular rate and rhythm with no rubs or gallops.  No JVD or thyromegaly.  Respiratory: Clear to auscultation with no wheezes or rales.  Abdomen:  Obese nontender  nondistended normal bowel sounds x4 quadrant.  Musculoskeletal: Good strength in upper and lower extremities bilaterally  Skin: no ulcerative lesions.  Psychiatry: Mood is appropriate condition and setting.   Data Reviewed: CBC: Recent Labs  Lab 03/17/18 0609 03/19/18 0642 03/20/18 0703 03/21/18 0449  WBC 18.2* 17.6* 14.5* 12.7*  HGB 11.0* 11.5* 10.9* 11.4*  HCT 34.7* 35.9* 35.2* 37.0*  MCV 84.4 81.0 83.4 83.9  PLT 410* 481* 486* 824*   Basic Metabolic Panel: Recent Labs  Lab 03/17/18 0609 03/19/18 0642 03/20/18 0703 03/21/18 0449  NA 135 132* 134* 136  K 3.8 2.9* 3.3* 3.4*  CL 99* 92* 95* 95*  CO2 25 28 29 31   GLUCOSE 108* 116* 92 96  BUN 20 16 17 13   CREATININE 1.02 0.77 0.81 0.74  CALCIUM 8.4* 8.6* 8.5* 8.2*  MG 2.2 1.9 2.0  --    GFR: Estimated Creatinine Clearance: 125.9 mL/min (by C-G formula based on SCr of 0.74 mg/dL). Liver Function Tests: Recent Labs  Lab 03/17/18 0609 03/19/18 0642 03/20/18 0703  AST 47* 70* 65*  ALT 41 51 46  ALKPHOS 109 115 101  BILITOT 0.9 1.1 1.1  PROT 6.7 6.8 6.9  ALBUMIN 2.3* 2.0* 2.0*   No results for input(s): LIPASE, AMYLASE in the last 168 hours. Recent Labs  Lab 03/19/18 0642 03/20/18 0703 03/22/18 1201  AMMONIA 36* 32 28   Coagulation Profile: No results for input(s): INR, PROTIME in the last 168 hours. Cardiac Enzymes: Recent Labs  Lab 03/17/18 0609  CKTOTAL 113   BNP (last 3 results) No results for input(s): PROBNP in the last 8760 hours. HbA1C: No results for input(s): HGBA1C in the last 72 hours. CBG: No results for input(s): GLUCAP in the last 168 hours. Lipid Profile: No results for input(s): CHOL, HDL, LDLCALC, TRIG, CHOLHDL, LDLDIRECT in the last 72 hours. Thyroid Function Tests: No results for input(s): TSH, T4TOTAL, FREET4, T3FREE, THYROIDAB in the last 72 hours. Anemia Panel: No results for input(s): VITAMINB12, FOLATE, FERRITIN, TIBC, IRON, RETICCTPCT in the last 72 hours. Urine  analysis:    Component Value Date/Time   COLORURINE AMBER (A) 03/15/2018 1830   APPEARANCEUR CLOUDY (A) 03/15/2018 1830   LABSPEC 1.029 03/15/2018 1830   PHURINE 5.0 03/15/2018 1830   GLUCOSEU 50 (A) 03/15/2018 1830   HGBUR MODERATE (A) 03/15/2018 1830   BILIRUBINUR MODERATE (A) 03/15/2018 1830   KETONESUR NEGATIVE 03/15/2018 1830   PROTEINUR 100 (A) 03/15/2018 1830   UROBILINOGEN 0.2 03/05/2011 1909   NITRITE NEGATIVE 03/15/2018 1830   LEUKOCYTESUR NEGATIVE 03/15/2018 1830   Sepsis Labs: @LABRCNTIP (procalcitonin:4,lacticidven:4)  ) Recent Results (from the past 240 hour(s))  Urine culture     Status: Abnormal   Collection Time: 03/15/18  6:30 PM  Result Value Ref Range Status   Specimen Description   Final    URINE, RANDOM Performed at [facility], [address], [place], [place] [address]    Special Requests   Final    NONE Performed at [facility], [address], [place], [place] [address]    Culture (A)  Final    <10,000 COLONIES/mL INSIGNIFICANT GROWTH Performed at [place] [address] [place], [place] [address]    Report Status 03/17/2018 FINAL  Final  Culture, blood (routine x 2)     Status: Abnormal   Collection Time: 03/15/18  8:00 PM  Result Value Ref Range Status   Specimen Description   Final    BLOOD RIGHT WRIST Performed at [facility], [place] [address] [place], [place] [address]    Special Requests   Final    BOTTLES DRAWN AEROBIC AND ANAEROBIC Blood Culture adequate volume Performed at [facility], [address] [place],  [address]    Culture  Setup Time   Final    GRAM POSITIVE COCCI IN BOTH AEROBIC AND ANAEROBIC BOTTLES Performed at [facility] Gram Stain Report Called to,Read Back By and Verified With: JOHNSON,[email or web address]  BY MATTHEWS, B 3.30.19 CRITICAL RESULT CALLED TO, READ BACK BY AND VERIFIED WITH: NATE HAYES,PHARMD [id number] BY SJW Performed at [facility], [place] [address], [place],   [address]    Culture STAPHYLOCOCCUS AUREUS (A)  Final   Report Status 03/20/2018 FINAL  Final   Organism ID, Bacteria STAPHYLOCOCCUS AUREUS  Final      Susceptibility   Staphylococcus aureus - MIC*    CIPROFLOXACIN <=0.5 SENSITIVE Sensitive     ERYTHROMYCIN 0.5 SENSITIVE Sensitive     GENTAMICIN <=0.5 SENSITIVE Sensitive     OXACILLIN <=0.25 SENSITIVE Sensitive     TETRACYCLINE <=1 SENSITIVE Sensitive     VANCOMYCIN <=0.5 SENSITIVE Sensitive     TRIMETH/SULFA <=10 SENSITIVE Sensitive     CLINDAMYCIN <=0.25 SENSITIVE Sensitive     RIFAMPIN <=0.5 SENSITIVE Sensitive     Inducible Clindamycin NEGATIVE Sensitive     * STAPHYLOCOCCUS AUREUS  Blood Culture ID Panel (Reflexed)     Status: Abnormal   Collection Time: 03/15/18  8:00 PM  Result Value Ref Range Status   Enterococcus species NOT DETECTED NOT DETECTED Final   Listeria monocytogenes NOT DETECTED NOT DETECTED Final   Staphylococcus species DETECTED (A) NOT DETECTED Final    Comment: CRITICAL RESULT CALLED TO, READ BACK BY AND VERIFIED WITH: NATE HAYES PHARMD AT 0954 ON [id number] BY SJW    Staphylococcus aureus DETECTED (A) NOT DETECTED Final    Comment: CRITICAL RESULT CALLED TO, READ BACK BY AND VERIFIED WITH: NATE HAYES PHARMD AT 0954 ON [id number] BY SJW    Methicillin resistance NOT DETECTED NOT DETECTED Final   Streptococcus species NOT DETECTED NOT DETECTED Final   Streptococcus agalactiae NOT DETECTED NOT DETECTED Final   Streptococcus pneumoniae NOT DETECTED NOT DETECTED Final   Streptococcus pyogenes NOT DETECTED NOT DETECTED Final   Acinetobacter baumannii NOT DETECTED NOT DETECTED Final   Enterobacteriaceae species NOT DETECTED NOT DETECTED Final   Enterobacter cloacae complex NOT DETECTED NOT DETECTED Final   Escherichia coli NOT DETECTED NOT DETECTED Final   Klebsiella oxytoca NOT DETECTED NOT DETECTED Final   Klebsiella pneumoniae NOT DETECTED NOT DETECTED Final   Proteus species NOT DETECTED NOT DETECTED Final    Serratia marcescens NOT DETECTED NOT DETECTED Final   Haemophilus influenzae NOT DETECTED NOT DETECTED Final   Neisseria meningitidis NOT DETECTED NOT DETECTED Final   Pseudomonas aeruginosa NOT DETECTED NOT DETECTED Final   Candida albicans NOT DETECTED NOT DETECTED Final   Candida glabrata NOT DETECTED NOT DETECTED Final   Candida krusei NOT DETECTED NOT DETECTED Final   Candida parapsilosis NOT DETECTED NOT DETECTED  Final   Candida tropicalis NOT DETECTED NOT DETECTED Final    Comment: Performed at [facility], [place] [address], [place], [place] [address]  Culture, blood (routine x 2)     Status: None   Collection Time: 03/15/18  8:21 PM  Result Value Ref Range Status   Specimen Description BLOOD LEFT HAND  Final   Special Requests   Final    BOTTLES DRAWN AEROBIC AND ANAEROBIC Blood Culture adequate volume   Culture   Final    NO GROWTH 5 DAYS Performed at [facility], [address] [place], [place] [address]    Report Status 03/20/2018 FINAL  Final  Respiratory Panel by PCR     Status: None   Collection Time: 03/16/18 11:40 AM  Result Value Ref Range Status   Adenovirus NOT DETECTED NOT DETECTED Final   Coronavirus 229E NOT DETECTED NOT DETECTED Final   Coronavirus HKU1 NOT DETECTED NOT DETECTED Final   Coronavirus NL63 NOT DETECTED NOT DETECTED Final   Coronavirus OC43 NOT DETECTED NOT DETECTED Final   Metapneumovirus NOT DETECTED NOT DETECTED Final   Rhinovirus / Enterovirus NOT DETECTED NOT DETECTED Final   Influenza A NOT DETECTED NOT DETECTED Final   Influenza B NOT DETECTED NOT DETECTED Final   Parainfluenza Virus 1 NOT DETECTED NOT DETECTED Final   Parainfluenza Virus 2 NOT DETECTED NOT DETECTED Final   Parainfluenza Virus 3 NOT DETECTED NOT DETECTED Final   Parainfluenza Virus 4 NOT DETECTED NOT DETECTED Final   Respiratory Syncytial Virus NOT DETECTED NOT DETECTED Final   Bordetella pertussis NOT DETECTED NOT DETECTED Final   Chlamydophila pneumoniae NOT  DETECTED NOT DETECTED Final   Mycoplasma pneumoniae NOT DETECTED NOT DETECTED Final    Comment: Performed at [facility], [place] [address], [place], [place] [address]  Culture, blood (routine x 2)     Status: None   Collection Time: 03/18/18 12:38 PM  Result Value Ref Range Status   Specimen Description LEFT ANTECUBITAL  Final   Special Requests   Final    BOTTLES DRAWN AEROBIC AND ANAEROBIC Blood Culture adequate volume   Culture   Final    NO GROWTH 5 DAYS Performed at [facility], [address], [place], [place] [address]    Report Status 03/23/2018 FINAL  Final  Culture, blood (routine x 2)     Status: None   Collection Time: 03/18/18 12:45 PM  Result Value Ref Range Status   Specimen Description BLOOD LEFT HAND  Final   Special Requests   Final    BOTTLES DRAWN AEROBIC AND ANAEROBIC Blood Culture results may not be optimal due to an inadequate volume of blood received in culture bottles   Culture   Final    NO GROWTH 5 DAYS Performed at [facility], [address] [place], [place] [address]    Report Status 03/23/2018 FINAL  Final  C difficile quick scan w PCR reflex     Status: None   Collection Time: 03/19/18  5:04 PM  Result Value Ref Range Status   C Diff antigen NEGATIVE NEGATIVE Final   C Diff toxin NEGATIVE NEGATIVE Final   C Diff interpretation No C. difficile detected.  Final    Comment: VALID Performed at [facility], [address], [place], [place] [address]   Gastrointestinal Panel by PCR , Stool     Status: None   Collection Time: 03/19/18  5:04 PM  Result Value Ref Range Status   Campylobacter  species NOT DETECTED NOT DETECTED Final   Plesimonas shigelloides NOT DETECTED NOT DETECTED Final   Salmonella species NOT DETECTED NOT DETECTED Final   Yersinia enterocolitica NOT DETECTED NOT DETECTED Final   Vibrio species NOT DETECTED NOT DETECTED Final   Vibrio cholerae NOT DETECTED NOT DETECTED Final   Enteroaggregative E coli (EAEC) NOT DETECTED NOT  DETECTED Final   Enteropathogenic E coli (EPEC) NOT DETECTED NOT DETECTED Final   Enterotoxigenic E coli (ETEC) NOT DETECTED NOT DETECTED Final   Shiga like toxin producing E coli (STEC) NOT DETECTED NOT DETECTED Final   Shigella/Enteroinvasive E coli (EIEC) NOT DETECTED NOT DETECTED Final   Cryptosporidium NOT DETECTED NOT DETECTED Final   Cyclospora cayetanensis NOT DETECTED NOT DETECTED Final   Entamoeba histolytica NOT DETECTED NOT DETECTED Final   Giardia lamblia NOT DETECTED NOT DETECTED Final   Adenovirus F40/41 NOT DETECTED NOT DETECTED Final   Astrovirus NOT DETECTED NOT DETECTED Final   Norovirus GI/GII NOT DETECTED NOT DETECTED Final   Rotavirus A NOT DETECTED NOT DETECTED Final   Sapovirus (I, II, IV, and V) NOT DETECTED NOT DETECTED Final    Comment: Performed at [facility], [address], [place], [place] [address]      Studies: Ct Cervical Spine W Contrast  Result Date: 03/23/2018 CLINICAL DATA:  Sepsis, pneumonia. Evaluate for possible spinal source of infection or epidural abscess. EXAM: CT CERVICAL SPINE WITH CONTRAST TECHNIQUE: Multidetector CT imaging of the cervical spine was performed during intravenous contrast administration. Multiplanar CT image reconstructions were also generated. CONTRAST:  67mL ISOVUE-300 IOPAMIDOL (ISOVUE-300) INJECTION 61% COMPARISON:  CT of the thoracic and lumbar spine 03/20/2018. FINDINGS: Alignment: Straightening of the normal cervical lordosis status post C3-C6 ACDF. 2 mm anterolisthesis C2-3 is physiologic/facet mediated. Skull base and vertebrae: No acute fracture. No primary bone lesion or focal pathologic process. Soft tissues and spinal canal: Dorsal column stimulator has been placed opposite the C4-T1 vertebral segments. This device, in combination with artifact from plate and screw fixation precludes definitive evaluation of the spinal canal. Presence or absence of an epidural/subdural fluid collection or abscess cannot be  confirmed or excluded. No paravertebral inflammatory process is identified. There are no findings strongly suggestive of osteomyelitis. Disc levels: C2-3: Uncinate spurring and LEFT greater than RIGHT facet arthropathy results in LEFT C3 foraminal narrowing. C3-4:  Post fusion interspace.  Foraminal narrowing on the RIGHT. C4-5:  Post fusion interspace.  Foraminal narrowing on the RIGHT. C5-6:  Post fusion interspace.  Unremarkable. C6-7:  Disc space narrowing.  No definite impingement. C7-T1:  Facet arthropathy.  No definite impingement. Upper chest: Not assessed. Other: None. IMPRESSION: Dorsal column stimulator placement opposite the C4-T1 vertebral segments, in conjunction with artifact from plate and screw fixation due to C3-C6 ACDF precludes definitive evaluation of the spinal canal. Presence or absence of an epidural/subdural fluid collection or abscess cannot be confirmed or excluded. Consider removal of the dorsal column stimulator, followed by MRI of the cervical spine without and with contrast, if strong suspicion of intraspinal fluid collection exists, as the parameters of the current spinal cord stimulator do not allow cervical MR imaging. Advanced spondylosis. Bony overgrowth contributes to multilevel foraminal narrowing as described. No paravertebral inflammatory process or changes of osteomyelitis are observed. Electronically Signed   By: Staci Righter M.D.   On: 03/23/2018 07:52   [place] Ekg Site Rite  Result Date: 03/23/2018 If Site Rite image not attached, placement could not be confirmed due to current cardiac rhythm.  Scheduled Meds: . amLODipine  10 mg Oral Daily  . aspirin EC  81 mg Oral Daily  . chlorhexidine  15 mL Mouth Rinse BID  . cycloSPORINE  1 drop Both Eyes BID  . donepezil  10 mg Oral QHS  . enoxaparin (LOVENOX) injection  40 mg Subcutaneous Q24H  . finasteride  5 mg Oral Daily  . gabapentin  600 mg Oral TID  . ipratropium-albuterol  3 mL Nebulization BID  . latanoprost   1 drop Both Eyes QHS  . mouth rinse  15 mL Mouth Rinse q12n4p  . memantine  10 mg Oral BID  . pantoprazole  40 mg Oral Daily  . potassium chloride  20 mEq Oral BID  . pravastatin  10 mg Oral QPM  . saccharomyces boulardii  250 mg Oral BID WC  . sodium chloride flush  10-40 mL Intracatheter Q12H  . traZODone  100 mg Oral QHS  . venlafaxine XR  150 mg Oral BID  . vitamin B-12  1,000 mcg Oral Daily    Continuous Infusions: .  ceFAZolin (ANCEF) IV Stopped (03/23/18 2202)     LOS: 7 days     Kayleen Memos, MD [facility] Pager [phone]  If 7PM-7AM, please contact night-coverage [email or web address] Password [email or web address] 03/23/2018, 6:10 PM

## 2018-03-23 NOTE — Progress Notes (Signed)
[facility]  Date of Admission:  03/15/2018          ASSESSMENT/PLAN  [name] remains afebrile and continues to receive Cefazolin for MSSA bacteremia of questionable origin although appear likely to be stemming from his back. He does continue to have low back pain and weakness. CT scan of the cervical spine did not provide any additional insight, with the recommendation for removal of the spinal stimulator. Repeat blood cultures were finalized on today and with no growth. Continued differentials for the back include infection, fracture or contusion at the T10-11 levels. At this point it appears reasonable to seek neurosurgical input as well and for possible stimulator removal.   1. Continue cefazolin for MSSA bacteremia with goal of treatment at least 6 weeks as unable to confirm infection the the lower thoracic spine. [place] to place PICC for long term therapy. 3. Recommend neurosurgery consult for assistance with lower extremity weakness and possible stimulator removal.    Principal Problem:   Bacteremia due to methicillin susceptible Staphylococcus aureus (MSSA) Active Problems:   HYPERCHOLESTEROLEMIA   Essential hypertension, benign   CAD, NATIVE VESSEL   GERD   Chronic back pain   History of cerebrovascular disease   Weakness   Fever   Sepsis ([place])   Nephrolithiasis   AKI (acute kidney injury) ([place])   Acute metabolic encephalopathy   Cognitive impairment   Chronic pain syndrome   Obesity   Staphylococcus aureus sepsis ([facility])   Encephalopathy   Weakness of both lower extremities   Discitis of thoracic region   Spinal cord stimulator status   . amLODipine  10 mg Oral Daily  . aspirin EC  81 mg Oral Daily  . chlorhexidine  15 mL Mouth Rinse BID  . cycloSPORINE  1 drop Both Eyes BID  . donepezil  10 mg Oral QHS  . enoxaparin (LOVENOX) injection  40 mg Subcutaneous Q24H  . finasteride  5 mg Oral Daily  . gabapentin  600 mg Oral TID  .  ipratropium-albuterol  3 mL Nebulization BID  . latanoprost  1 drop Both Eyes QHS  . mouth rinse  15 mL Mouth Rinse q12n4p  . memantine  10 mg Oral BID  . pantoprazole  40 mg Oral Daily  . potassium chloride  20 mEq Oral BID  . pravastatin  10 mg Oral QPM  . saccharomyces boulardii  250 mg Oral BID WC  . sodium chloride flush  10-40 mL Intracatheter Q12H  . traZODone  100 mg Oral QHS  . venlafaxine XR  150 mg Oral BID  . vitamin B-12  1,000 mcg Oral Daily    SUBJECTIVE:  Cody Velez has remained afebrile overnight with no new concerns. CT scan of the cervical spine unable to confirm or exclude a possible epidural /subdural fluid collection secondary to the doral column stimulator with recommendation to consider removal followed by MRI as this current parameters do not allow for cervical MRI imaging.   Denies fevers, chills, night sweats. Continues to have back pain and lower extremity weakness.   No Known Allergies   Review of Systems: Review of Systems  Constitutional: Negative for chills, fever and weight loss.  Respiratory: Negative for cough, shortness of breath and wheezing.   Cardiovascular: Negative for chest pain and leg swelling.  Musculoskeletal: Positive for back pain.  Skin: Negative for rash.  Neurological: Positive for weakness (Bilateral lower extremities).    OBJECTIVE: Vitals:   03/22/18 2151 03/23/18  2683 03/23/18 0905 03/23/18 0919  BP: 123/63 137/80 (!) 145/85   Pulse: 85 78    Resp: 18 18    Temp: 99 F (37.2 C) 98.7 F (37.1 C)    TempSrc: Oral     SpO2: 94% 95%  99%  Weight:  299 lb 13.2 oz (136 kg)    Height:       Body mass index is 37.48 kg/m.  Physical Exam  Constitutional: He is oriented to person, place, and time and well-developed, well-nourished, and in no distress. No distress.  Lethargic, responsive to verbal stimuli  Pulmonary/Chest: Effort normal and breath sounds normal. No respiratory distress. He has no wheezes. He has no  rales. He exhibits no tenderness.  Abdominal: Soft. Bowel sounds are normal. There is no tenderness.  Neurological: He is alert and oriented to person, place, and time.  Skin: Skin is warm and dry. No rash noted.  Psychiatric: Affect and judgment normal.    Lab Results Lab Results  Component Value Date   WBC 12.7 (H) 03/21/2018   HGB 11.4 (L) 03/21/2018   HCT 37.0 (L) 03/21/2018   MCV 83.9 03/21/2018   PLT 547 (H) 03/21/2018    Lab Results  Component Value Date   CREATININE 0.74 03/21/2018   BUN 13 03/21/2018   NA 136 03/21/2018   K 3.4 (L) 03/21/2018   CL 95 (L) 03/21/2018   CO2 31 03/21/2018    Lab Results  Component Value Date   ALT 46 03/20/2018   AST 65 (H) 03/20/2018   ALKPHOS 101 03/20/2018   BILITOT 1.1 03/20/2018     Microbiology: Recent Results (from the past 240 hour(s))  Urine culture     Status: Abnormal   Collection Time: 03/15/18  6:30 PM  Result Value Ref Range Status   Specimen Description   Final    URINE, RANDOM Performed at [facility], [address], [place], [place] [address]    Special Requests   Final    NONE Performed at [facility], [address], [place], [place] [address]    Culture (A)  Final    <10,000 COLONIES/mL INSIGNIFICANT GROWTH Performed at [facility], [place] [address], [place], [place] [address]    Report Status 03/17/2018 FINAL  Final  Culture, blood (routine x 2)     Status: Abnormal   Collection Time: 03/15/18  8:00 PM  Result Value Ref Range Status   Specimen Description   Final    BLOOD RIGHT WRIST Performed at [facility], [place] [address], [place], [place] [address]    Special Requests   Final    BOTTLES DRAWN AEROBIC AND ANAEROBIC Blood Culture adequate volume Performed at [facility], [address], [place], [place] [address]    Culture  Setup Time   Final    GRAM POSITIVE COCCI IN BOTH AEROBIC AND ANAEROBIC BOTTLES Performed at [facility] Gram Stain Report Called to,Read Back By and  Verified With: JOHNSON,[id number]  BY MATTHEWS, B 3.30.19 CRITICAL RESULT CALLED TO, READ BACK BY AND VERIFIED WITH: NATE HAYES,PHARMD [id number] BY SJW Performed at [facility], [place] [address] [place], [place] [address]    Culture STAPHYLOCOCCUS AUREUS (A)  Final   Report Status 03/20/2018 FINAL  Final   Organism ID, Bacteria STAPHYLOCOCCUS AUREUS  Final      Susceptibility   Staphylococcus aureus - MIC*    CIPROFLOXACIN <=0.5 SENSITIVE Sensitive     ERYTHROMYCIN 0.5 SENSITIVE Sensitive  GENTAMICIN <=0.5 SENSITIVE Sensitive     OXACILLIN <=0.25 SENSITIVE Sensitive     TETRACYCLINE <=1 SENSITIVE Sensitive     VANCOMYCIN <=0.5 SENSITIVE Sensitive     TRIMETH/SULFA <=10 SENSITIVE Sensitive     CLINDAMYCIN <=0.25 SENSITIVE Sensitive     RIFAMPIN <=0.5 SENSITIVE Sensitive     Inducible Clindamycin NEGATIVE Sensitive     * STAPHYLOCOCCUS AUREUS  Blood Culture ID Panel (Reflexed)     Status: Abnormal   Collection Time: 03/15/18  8:00 PM  Result Value Ref Range Status   Enterococcus species NOT DETECTED NOT DETECTED Final   Listeria monocytogenes NOT DETECTED NOT DETECTED Final   Staphylococcus species DETECTED (A) NOT DETECTED Final    Comment: CRITICAL RESULT CALLED TO, READ BACK BY AND VERIFIED WITH: NATE HAYES PHARMD AT 0954 ON [id number] BY SJW    Staphylococcus aureus DETECTED (A) NOT DETECTED Final    Comment: CRITICAL RESULT CALLED TO, READ BACK BY AND VERIFIED WITH: NATE HAYES PHARMD AT 0954 ON [id number] BY SJW    Methicillin resistance NOT DETECTED NOT DETECTED Final   Streptococcus species NOT DETECTED NOT DETECTED Final   Streptococcus agalactiae NOT DETECTED NOT DETECTED Final   Streptococcus pneumoniae NOT DETECTED NOT DETECTED Final   Streptococcus pyogenes NOT DETECTED NOT DETECTED Final   Acinetobacter baumannii NOT DETECTED NOT DETECTED Final   Enterobacteriaceae species NOT DETECTED NOT DETECTED Final   Enterobacter cloacae complex NOT DETECTED NOT DETECTED Final     Escherichia coli NOT DETECTED NOT DETECTED Final   Klebsiella oxytoca NOT DETECTED NOT DETECTED Final   Klebsiella pneumoniae NOT DETECTED NOT DETECTED Final   Proteus species NOT DETECTED NOT DETECTED Final   Serratia marcescens NOT DETECTED NOT DETECTED Final   Haemophilus influenzae NOT DETECTED NOT DETECTED Final   Neisseria meningitidis NOT DETECTED NOT DETECTED Final   Pseudomonas aeruginosa NOT DETECTED NOT DETECTED Final   Candida albicans NOT DETECTED NOT DETECTED Final   Candida glabrata NOT DETECTED NOT DETECTED Final   Candida krusei NOT DETECTED NOT DETECTED Final   Candida parapsilosis NOT DETECTED NOT DETECTED Final   Candida tropicalis NOT DETECTED NOT DETECTED Final    Comment: Performed at [facility], [address] [place], [place] [address]  Culture, blood (routine x 2)     Status: None   Collection Time: 03/15/18  8:21 PM  Result Value Ref Range Status   Specimen Description BLOOD LEFT HAND  Final   Special Requests   Final    BOTTLES DRAWN AEROBIC AND ANAEROBIC Blood Culture adequate volume   Culture   Final    NO GROWTH 5 DAYS Performed at [facility], [address] [place], [place] [address]    Report Status 03/20/2018 FINAL  Final  Respiratory Panel by PCR     Status: None   Collection Time: 03/16/18 11:40 AM  Result Value Ref Range Status   Adenovirus NOT DETECTED NOT DETECTED Final   Coronavirus 229E NOT DETECTED NOT DETECTED Final   Coronavirus HKU1 NOT DETECTED NOT DETECTED Final   Coronavirus NL63 NOT DETECTED NOT DETECTED Final   Coronavirus OC43 NOT DETECTED NOT DETECTED Final   Metapneumovirus NOT DETECTED NOT DETECTED Final   Rhinovirus / Enterovirus NOT DETECTED NOT DETECTED Final   Influenza A NOT DETECTED NOT DETECTED Final   Influenza B NOT DETECTED NOT DETECTED Final   Parainfluenza Virus 1 NOT DETECTED NOT DETECTED Final   Parainfluenza Virus 2 NOT DETECTED NOT DETECTED Final   Parainfluenza  Virus 3 NOT DETECTED NOT DETECTED  Final   Parainfluenza Virus 4 NOT DETECTED NOT DETECTED Final   Respiratory Syncytial Virus NOT DETECTED NOT DETECTED Final   Bordetella pertussis NOT DETECTED NOT DETECTED Final   Chlamydophila pneumoniae NOT DETECTED NOT DETECTED Final   Mycoplasma pneumoniae NOT DETECTED NOT DETECTED Final    Comment: Performed at [facility], [place] [address] [place], [place] [address]  Culture, blood (routine x 2)     Status: None   Collection Time: 03/18/18 12:38 PM  Result Value Ref Range Status   Specimen Description LEFT ANTECUBITAL  Final   Special Requests   Final    BOTTLES DRAWN AEROBIC AND ANAEROBIC Blood Culture adequate volume   Culture   Final    NO GROWTH 5 DAYS Performed at [facility], [address] [place], [place] [address]    Report Status 03/23/2018 FINAL  Final  Culture, blood (routine x 2)     Status: None   Collection Time: 03/18/18 12:45 PM  Result Value Ref Range Status   Specimen Description BLOOD LEFT HAND  Final   Special Requests   Final    BOTTLES DRAWN AEROBIC AND ANAEROBIC Blood Culture results may not be optimal due to an inadequate volume of blood received in culture bottles   Culture   Final    NO GROWTH 5 DAYS Performed at [facility], [address] [place], [place] [address]    Report Status 03/23/2018 FINAL  Final  C difficile quick scan w PCR reflex     Status: None   Collection Time: 03/19/18  5:04 PM  Result Value Ref Range Status   C Diff antigen NEGATIVE NEGATIVE Final   C Diff toxin NEGATIVE NEGATIVE Final   C Diff interpretation No C. difficile detected.  Final    Comment: VALID Performed at [facility], [address], [place], [place] [address]   Gastrointestinal Panel by PCR , Stool     Status: None   Collection Time: 03/19/18  5:04 PM  Result Value Ref Range Status   Campylobacter species NOT DETECTED NOT DETECTED Final   Plesimonas shigelloides NOT DETECTED NOT DETECTED Final   Salmonella species NOT DETECTED NOT DETECTED Final    Yersinia enterocolitica NOT DETECTED NOT DETECTED Final   Vibrio species NOT DETECTED NOT DETECTED Final   Vibrio cholerae NOT DETECTED NOT DETECTED Final   Enteroaggregative E coli (EAEC) NOT DETECTED NOT DETECTED Final   Enteropathogenic E coli (EPEC) NOT DETECTED NOT DETECTED Final   Enterotoxigenic E coli (ETEC) NOT DETECTED NOT DETECTED Final   Shiga like toxin producing E coli (STEC) NOT DETECTED NOT DETECTED Final   Shigella/Enteroinvasive E coli (EIEC) NOT DETECTED NOT DETECTED Final   Cryptosporidium NOT DETECTED NOT DETECTED Final   Cyclospora cayetanensis NOT DETECTED NOT DETECTED Final   Entamoeba histolytica NOT DETECTED NOT DETECTED Final   Giardia lamblia NOT DETECTED NOT DETECTED Final   Adenovirus F40/41 NOT DETECTED NOT DETECTED Final   Astrovirus NOT DETECTED NOT DETECTED Final   Norovirus GI/GII NOT DETECTED NOT DETECTED Final   Rotavirus A NOT DETECTED NOT DETECTED Final   Sapovirus (I, II, IV, and V) NOT DETECTED NOT DETECTED Final    Comment: Performed at [facility], [address], [place],  [address]     Terri Piedra, NP [facility] [phone] Pager  03/23/2018  10:10 AM

## 2018-03-23 NOTE — Progress Notes (Signed)
Physical Therapy Treatment Patient Details Name: Cody Velez MRN: [id number] DOB: [birth year] Today's Date: 03/23/2018    History of Present Illness Cody Velez is a 72 y.o. male with medical history significant for chronic back pain with recent spinal cord stimulator placement several weeks ago, nephrolithiasis with recent admission for UTI, hypertension, dyslipidemia, morbid obesity, and CAD who was brought to the emergency department by family members due to worsening generalized weakness that led to a fall at home.  He actually saw urology earlier today as a follow-up appointment after recent admission to [facility] in [place] where he was noted to have some bilateral nephrolithiasis.  He was apparently prescribed some doxycycline during his urology visit today, likely due to suspicion of urinary tract infection.  He has had progressive confusion throughout the day and was noted to be hypoxemic upon arrival as well as hypoxemic during his urology visit earlier.    PT Comments    Pt is up to side of bed with max assist, then to chair with one person to steady chair and another to help with sliding over on it. He was assisted to sit more upright in the chair to support his spine, then elevated legs to help with edema from disuse.  Pt is fairly comfortable with this posture and nursing in to give him pain meds during the tx.  Will continue acutely to see for mobility and progression of standing to gait as heis able to tolerate.   Follow Up Recommendations  SNF     Equipment Recommendations  None recommended by PT    Recommendations for Other Services       Precautions / Restrictions Precautions Precautions: Fall;Back Precaution Booklet Issued: No Precaution Comments: use spinal body mechanics to protect for pain and prior diagnoses Restrictions Weight Bearing Restrictions: No Other Position/Activity Restrictions: sit upright in chair for spinal protection    Mobility  Bed  Mobility Overal bed mobility: Needs Assistance Bed Mobility: Rolling;Sidelying to Sit Rolling: Max assist Sidelying to sit: Max assist       General bed mobility comments: uses bed rail and cannot control sitting unless the walker is in front of him to lean forward on it in sitting  Transfers Overall transfer level: Needs assistance Equipment used: Rolling walker (2 wheeled);2 person hand held assist Transfers: Lateral/Scoot Transfers;Sit to/from Stand Sit to Stand: Total assist        Lateral/Scoot Transfers: Mod assist General transfer comment: pt could not stand up from elevated surface so used chair to slide over despite recently being min assist to stand and get in chair  Ambulation/Gait             General Gait Details: unable   Stairs            Wheelchair Mobility    Modified Rankin (Stroke Patients Only)       Balance     Sitting balance-Leahy Scale: Poor     Standing balance support: Bilateral upper extremity supported;During functional activity Standing balance-Leahy Scale: Zero                              Cognition Arousal/Alertness: Awake/alert Behavior During Therapy: WFL for tasks assessed/performed Overall Cognitive Status: Within Functional Limits for tasks assessed                                 General  Comments: wife is present and supportive      Exercises      General Comments General comments (skin integrity, edema, etc.): Pt has functionally significantly declined since his admission to hospitals, now requiring standing lift for transition to chair with nursing.  Educated nursing about this during therapy session.      Pertinent Vitals/Pain Pain Assessment: 0-10 Pain Score: 8  Pain Location: low back Pain Descriptors / Indicators: Sharp;Aching;Grimacing;Guarding Pain Intervention(s): Limited activity within patient's tolerance;Monitored during session;Premedicated before  session;Repositioned;RN gave pain meds during session    Home Living                      Prior Function            PT Goals (current goals can now be found in the care plan section) Acute Rehab PT Goals Patient Stated Goal: return home Progress towards PT goals: Not progressing toward goals - comment(pt has a worsened pain issue affecting functional mobility)    Frequency    Min 2X/week      PT Plan Current plan remains appropriate    Co-evaluation              AM-PAC PT "6 Clicks" Daily Activity  Outcome Measure  Difficulty turning over in bed (including adjusting bedclothes, sheets and blankets)?: Unable Difficulty moving from lying on back to sitting on the side of the bed? : Unable Difficulty sitting down on and standing up from a chair with arms (e.g., wheelchair, bedside commode, etc,.)?: Unable Help needed moving to and from a bed to chair (including a wheelchair)?: A Lot Help needed walking in hospital room?: Total Help needed climbing 3-5 steps with a railing? : Total 6 Click Score: 7    End of Session Equipment Utilized During Treatment: Gait belt Activity Tolerance: Patient limited by fatigue;Other (comment);Patient limited by pain(limited by LE weakness) Patient left: in chair;with call bell/phone within reach;with nursing/sitter in room;with family/visitor present Nurse Communication: Mobility status;Need for lift equipment PT Visit Diagnosis: Unsteadiness on feet (R26.81);Muscle weakness (generalized) (M62.81);History of falling (Z91.81);Pain Pain - Right/Left: (back) Pain - part of body: (back)     Time: 7846-9629 PT Time Calculation (min) (ACUTE ONLY): 40 min  Charges:  $Therapeutic Activity: 23-37 mins $Neuromuscular Re-education: 8-22 mins                    G Codes:  Functional Assessment Tool Used: AM-PAC 6 Clicks Basic Mobility     Ramond Dial 03/23/2018, 9:13 AM   Mee Hives, PT [facility] Acute Rehab Dept. Number: [place] [facility] [place]

## 2018-03-24 ENCOUNTER — Encounter (HOSPITAL_COMMUNITY): Payer: Self-pay | Admitting: Cardiology

## 2018-03-24 DIAGNOSIS — K5900 Constipation, unspecified: Secondary | ICD-10-CM

## 2018-03-24 LAB — BASIC METABOLIC PANEL
Anion gap: 8 (ref 5–15)
BUN: 11 mg/dL (ref 6–20)
CO2: 26 mmol/L (ref 22–32)
Calcium: 8.5 mg/dL — ABNORMAL LOW (ref 8.9–10.3)
Chloride: 99 mmol/L — ABNORMAL LOW (ref 101–111)
Creatinine, Ser: 0.86 mg/dL (ref 0.61–1.24)
GFR calc Af Amer: >60 mL/min (ref >60)
GFR calc non Af Amer: >60 mL/min (ref >60)
Glucose, Bld: 87 mg/dL (ref 65–99)
Potassium: 4.3 mmol/L (ref 3.5–5.1)
Sodium: 133 mmol/L — ABNORMAL LOW (ref 135–145)

## 2018-03-24 LAB — CBC
HCT: 34.2 % — ABNORMAL LOW (ref 39.0–52.0)
Hemoglobin: 10.7 g/dL — ABNORMAL LOW (ref 13.0–17.0)
MCH: 26.4 pg (ref 26.0–34.0)
MCHC: 31.3 g/dL (ref 30.0–36.0)
MCV: 84.2 fL (ref 78.0–100.0)
Platelets: 645 10*3/uL — ABNORMAL HIGH (ref 150–400)
RBC: 4.06 MIL/uL — ABNORMAL LOW (ref 4.22–5.81)
RDW: 16.8 % — ABNORMAL HIGH (ref 11.5–15.5)
WBC: 11.6 10*3/uL — ABNORMAL HIGH (ref 4.0–10.5)

## 2018-03-24 LAB — HEPATITIS C ANTIBODY: HCV Ab: <0.1 s/co ratio (ref 0.0–0.9)

## 2018-03-24 MED ORDER — BISACODYL 5 MG PO TBEC
5.0000 mg | DELAYED_RELEASE_TABLET | Freq: Every day | ORAL | Status: DC | PRN
Start: 2018-03-24 — End: 2018-03-30
  Administered 2018-03-30: 5 mg via ORAL
  Filled 2018-03-24: qty 1

## 2018-03-24 MED ORDER — SENNOSIDES-DOCUSATE SODIUM 8.6-50 MG PO TABS
1.0000 | ORAL_TABLET | Freq: Two times a day (BID) | ORAL | Status: DC
  Administered 2018-03-24 – 2018-03-27 (×7): 1 via ORAL
  Filled 2018-03-24 (×7): qty 1

## 2018-03-24 NOTE — Progress Notes (Signed)
Patient has home machine and places himself on and off without any assistance.

## 2018-03-24 NOTE — Progress Notes (Addendum)
[facility]  Date of Admission:  03/15/2018              ASSESSMENT/PLAN  Cody Velez continues to have weakness of the lower extremities and receive treatment for potential infection in his lower thoracic spine with questionable involvement of the cervical spine. He does not appear to have any cervical symptoms. MSSA bacteremia appears to be resolved with current Ancef with goal treatment of 6 weeks of therapy. Dr. Johnnye Sima spoke with Dr. Ellene Route (per patient request) regarding potential for removal of the stimulator. He has remained afebrile. Continues to work with physical therapy. Mild constipation likely from pain medication induced.   1. Continue cefazolin for MSSA bacteremia and will treat for back infection to be thorough. 2. Neurosurgery evaluation pending for possible removal of stimulator.  3. Constipation treatment per primary team.    Principal Problem:   Bacteremia due to methicillin susceptible Staphylococcus aureus (MSSA) Active Problems:   HYPERCHOLESTEROLEMIA   Essential hypertension, benign   CAD, NATIVE VESSEL   GERD   Chronic back pain   History of cerebrovascular disease   Weakness   Fever   Sepsis ([place])   Nephrolithiasis   AKI (acute kidney injury) ([place])   Acute metabolic encephalopathy   Cognitive impairment   Chronic pain syndrome   Obesity   Staphylococcus aureus sepsis ([facility])   Encephalopathy   Weakness of both lower extremities   Discitis of thoracic region   Spinal cord stimulator status   . amLODipine  10 mg Oral Daily  . aspirin EC  81 mg Oral Daily  . chlorhexidine  15 mL Mouth Rinse BID  . cycloSPORINE  1 drop Both Eyes BID  . donepezil  10 mg Oral QHS  . enoxaparin (LOVENOX) injection  40 mg Subcutaneous Q24H  . finasteride  5 mg Oral Daily  . gabapentin  600 mg Oral TID  . ipratropium-albuterol  3 mL Nebulization BID  . latanoprost  1 drop Both Eyes QHS  . mouth rinse  15 mL Mouth Rinse q12n4p  .  memantine  10 mg Oral BID  . pantoprazole  40 mg Oral Daily  . potassium chloride  20 mEq Oral BID  . pravastatin  10 mg Oral QPM  . saccharomyces boulardii  250 mg Oral BID WC  . sodium chloride flush  10-40 mL Intracatheter Q12H  . traZODone  100 mg Oral QHS  . venlafaxine XR  150 mg Oral BID  . vitamin B-12  1,000 mcg Oral Daily    SUBJECTIVE:  Afebrile overnight. Continues to receive Cefazolin for MSSA bacteremia. PICC line inserted. No new problems/concerns overnight. Continues to have weakness and pain that generally wax and wane. He was able to sit up in the chair for about an hour yesterday. Expresses some mild constipation as he has normal bowel movements daily with last bowel movement yesterday. Hepatitis C negative.   No Known Allergies   Review of Systems: Review of Systems  Constitutional: Negative for chills and fever.  Respiratory: Negative for cough, shortness of breath and wheezing.   Cardiovascular: Negative for chest pain and leg swelling.  Gastrointestinal: Negative for abdominal pain, constipation, diarrhea, nausea and vomiting.  Musculoskeletal: Positive for back pain.  Skin: Negative for rash.  Neurological: Positive for weakness.    OBJECTIVE: Vitals:   03/24/18 0400 03/24/18 0533 03/24/18 0827 03/24/18 0838  BP:  (!) 141/75 134/80   Pulse:  81  84  Resp:  18  18  Temp:  99.2 F (37.3 C)    TempSrc:  Oral    SpO2:  95%  96%  Weight: 295 lb 3 oz (133.9 kg)     Height:       Body mass index is 36.9 kg/m.  Physical Exam  Constitutional: He is oriented to person, place, and time and well-developed, well-nourished, and in no distress. No distress.  Cardiovascular: Normal rate, regular rhythm and intact distal pulses. Exam reveals no gallop and no friction rub.  No murmur heard. PICC line in right upper are is clean, dry and intact with no evidence of infection.   Pulmonary/Chest: Effort normal and breath sounds normal. No respiratory distress. He  has no wheezes. He has no rales. He exhibits no tenderness.  Musculoskeletal: He exhibits no edema, tenderness or deformity.  Neurological: He is alert and oriented to person, place, and time.  Skin: Skin is warm and dry. No rash noted.  Psychiatric: Affect and judgment normal.    Lab Results Lab Results  Component Value Date   WBC 12.7 (H) 03/21/2018   HGB 11.4 (L) 03/21/2018   HCT 37.0 (L) 03/21/2018   MCV 83.9 03/21/2018   PLT 547 (H) 03/21/2018    Lab Results  Component Value Date   CREATININE 0.86 03/24/2018   BUN 11 03/24/2018   NA 133 (L) 03/24/2018   K 4.3 03/24/2018   CL 99 (L) 03/24/2018   CO2 26 03/24/2018    Lab Results  Component Value Date   ALT 46 03/20/2018   AST 65 (H) 03/20/2018   ALKPHOS 101 03/20/2018   BILITOT 1.1 03/20/2018     Microbiology: Recent Results (from the past 240 hour(s))  Urine culture     Status: Abnormal   Collection Time: 03/15/18  6:30 PM  Result Value Ref Range Status   Specimen Description   Final    URINE, RANDOM Performed at [facility], [address], [place], [place] [address]    Special Requests   Final    NONE Performed at [facility], [address], [place], [place] [address]    Culture (A)  Final    <10,000 COLONIES/mL INSIGNIFICANT GROWTH Performed at [facility], [place] [address], [place], [place] [address]    Report Status 03/17/2018 FINAL  Final  Culture, blood (routine x 2)     Status: Abnormal   Collection Time: 03/15/18  8:00 PM  Result Value Ref Range Status   Specimen Description   Final    BLOOD RIGHT WRIST Performed at [facility], [place] [address], [place], [place] [address]    Special Requests   Final    BOTTLES DRAWN AEROBIC AND ANAEROBIC Blood Culture adequate volume Performed at [facility], [address] [place], [place] [address]    Culture  Setup Time   Final    GRAM POSITIVE COCCI IN BOTH AEROBIC AND ANAEROBIC BOTTLES Performed at [facility] Stain Report  Called to,Read Back By and Verified With: JOHNSON,[email or web address]  BY MATTHEWS, B 3.30.19 CRITICAL RESULT CALLED TO, READ BACK BY AND VERIFIED WITH: NATE HAYES,PHARMD [id number] BY SJW Performed at [facility], [place] [address], [place], [place] [address]    Culture STAPHYLOCOCCUS AUREUS (A)  Final   Report Status 03/20/2018 FINAL  Final   Organism ID, Bacteria STAPHYLOCOCCUS AUREUS  Final      Susceptibility   Staphylococcus aureus - MIC*    CIPROFLOXACIN <=0.5 SENSITIVE Sensitive     ERYTHROMYCIN  0.5 SENSITIVE Sensitive     GENTAMICIN <=0.5 SENSITIVE Sensitive     OXACILLIN <=0.25 SENSITIVE Sensitive     TETRACYCLINE <=1 SENSITIVE Sensitive     VANCOMYCIN <=0.5 SENSITIVE Sensitive     TRIMETH/SULFA <=10 SENSITIVE Sensitive     CLINDAMYCIN <=0.25 SENSITIVE Sensitive     RIFAMPIN <=0.5 SENSITIVE Sensitive     Inducible Clindamycin NEGATIVE Sensitive     * STAPHYLOCOCCUS AUREUS  Blood Culture ID Panel (Reflexed)     Status: Abnormal   Collection Time: 03/15/18  8:00 PM  Result Value Ref Range Status   Enterococcus species NOT DETECTED NOT DETECTED Final   Listeria monocytogenes NOT DETECTED NOT DETECTED Final   Staphylococcus species DETECTED (A) NOT DETECTED Final    Comment: CRITICAL RESULT CALLED TO, READ BACK BY AND VERIFIED WITH: NATE HAYES PHARMD AT 0954 ON [id number] BY SJW    Staphylococcus aureus DETECTED (A) NOT DETECTED Final    Comment: CRITICAL RESULT CALLED TO, READ BACK BY AND VERIFIED WITH: NATE HAYES PHARMD AT 0954 ON [id number] BY SJW    Methicillin resistance NOT DETECTED NOT DETECTED Final   Streptococcus species NOT DETECTED NOT DETECTED Final   Streptococcus agalactiae NOT DETECTED NOT DETECTED Final   Streptococcus pneumoniae NOT DETECTED NOT DETECTED Final   Streptococcus pyogenes NOT DETECTED NOT DETECTED Final   Acinetobacter baumannii NOT DETECTED NOT DETECTED Final   Enterobacteriaceae species NOT DETECTED NOT DETECTED Final   Enterobacter cloacae complex NOT  DETECTED NOT DETECTED Final   Escherichia coli NOT DETECTED NOT DETECTED Final   Klebsiella oxytoca NOT DETECTED NOT DETECTED Final   Klebsiella pneumoniae NOT DETECTED NOT DETECTED Final   Proteus species NOT DETECTED NOT DETECTED Final   Serratia marcescens NOT DETECTED NOT DETECTED Final   Haemophilus influenzae NOT DETECTED NOT DETECTED Final   Neisseria meningitidis NOT DETECTED NOT DETECTED Final   Pseudomonas aeruginosa NOT DETECTED NOT DETECTED Final   Candida albicans NOT DETECTED NOT DETECTED Final   Candida glabrata NOT DETECTED NOT DETECTED Final   Candida krusei NOT DETECTED NOT DETECTED Final   Candida parapsilosis NOT DETECTED NOT DETECTED Final   Candida tropicalis NOT DETECTED NOT DETECTED Final    Comment: Performed at [facility], [address], [place], [place] [address]  Culture, blood (routine x 2)     Status: None   Collection Time: 03/15/18  8:21 PM  Result Value Ref Range Status   Specimen Description BLOOD LEFT HAND  Final   Special Requests   Final    BOTTLES DRAWN AEROBIC AND ANAEROBIC Blood Culture adequate volume   Culture   Final    NO GROWTH 5 DAYS Performed at [facility], [address] [place], [place] [address]    Report Status 03/20/2018 FINAL  Final  Respiratory Panel by PCR     Status: None   Collection Time: 03/16/18 11:40 AM  Result Value Ref Range Status   Adenovirus NOT DETECTED NOT DETECTED Final   Coronavirus 229E NOT DETECTED NOT DETECTED Final   Coronavirus HKU1 NOT DETECTED NOT DETECTED Final   Coronavirus NL63 NOT DETECTED NOT DETECTED Final   Coronavirus OC43 NOT DETECTED NOT DETECTED Final   Metapneumovirus NOT DETECTED NOT DETECTED Final   Rhinovirus / Enterovirus NOT DETECTED NOT DETECTED Final   Influenza A NOT DETECTED NOT DETECTED Final   Influenza B NOT DETECTED NOT DETECTED Final   Parainfluenza Virus 1 NOT DETECTED NOT DETECTED Final   Parainfluenza Virus 2 NOT DETECTED  NOT DETECTED Final   Parainfluenza Virus  3 NOT DETECTED NOT DETECTED Final   Parainfluenza Virus 4 NOT DETECTED NOT DETECTED Final   Respiratory Syncytial Virus NOT DETECTED NOT DETECTED Final   Bordetella pertussis NOT DETECTED NOT DETECTED Final   Chlamydophila pneumoniae NOT DETECTED NOT DETECTED Final   Mycoplasma pneumoniae NOT DETECTED NOT DETECTED Final    Comment: Performed at [facility], [place] [address] [place], [place] [address]  Culture, blood (routine x 2)     Status: None   Collection Time: 03/18/18 12:38 PM  Result Value Ref Range Status   Specimen Description LEFT ANTECUBITAL  Final   Special Requests   Final    BOTTLES DRAWN AEROBIC AND ANAEROBIC Blood Culture adequate volume   Culture   Final    NO GROWTH 5 DAYS Performed at [facility], [address] [place], [place] [address]    Report Status 03/23/2018 FINAL  Final  Culture, blood (routine x 2)     Status: None   Collection Time: 03/18/18 12:45 PM  Result Value Ref Range Status   Specimen Description BLOOD LEFT HAND  Final   Special Requests   Final    BOTTLES DRAWN AEROBIC AND ANAEROBIC Blood Culture results may not be optimal due to an inadequate volume of blood received in culture bottles   Culture   Final    NO GROWTH 5 DAYS Performed at [facility], [address] [place], [place] [address]    Report Status 03/23/2018 FINAL  Final  C difficile quick scan w PCR reflex     Status: None   Collection Time: 03/19/18  5:04 PM  Result Value Ref Range Status   C Diff antigen NEGATIVE NEGATIVE Final   C Diff toxin NEGATIVE NEGATIVE Final   C Diff interpretation No C. difficile detected.  Final    Comment: VALID Performed at [facility], [address], [place], [place] [address]   Gastrointestinal Panel by PCR , Stool     Status: None   Collection Time: 03/19/18  5:04 PM  Result Value Ref Range Status   Campylobacter species NOT DETECTED NOT DETECTED Final   Plesimonas shigelloides NOT DETECTED NOT DETECTED Final   Salmonella species NOT  DETECTED NOT DETECTED Final   Yersinia enterocolitica NOT DETECTED NOT DETECTED Final   Vibrio species NOT DETECTED NOT DETECTED Final   Vibrio cholerae NOT DETECTED NOT DETECTED Final   Enteroaggregative E coli (EAEC) NOT DETECTED NOT DETECTED Final   Enteropathogenic E coli (EPEC) NOT DETECTED NOT DETECTED Final   Enterotoxigenic E coli (ETEC) NOT DETECTED NOT DETECTED Final   Shiga like toxin producing E coli (STEC) NOT DETECTED NOT DETECTED Final   Shigella/Enteroinvasive E coli (EIEC) NOT DETECTED NOT DETECTED Final   Cryptosporidium NOT DETECTED NOT DETECTED Final   Cyclospora cayetanensis NOT DETECTED NOT DETECTED Final   Entamoeba histolytica NOT DETECTED NOT DETECTED Final   Giardia lamblia NOT DETECTED NOT DETECTED Final   Adenovirus F40/41 NOT DETECTED NOT DETECTED Final   Astrovirus NOT DETECTED NOT DETECTED Final   Norovirus GI/GII NOT DETECTED NOT DETECTED Final   Rotavirus A NOT DETECTED NOT DETECTED Final   Sapovirus (I, II, IV, and V) NOT DETECTED NOT DETECTED Final    Comment: Performed at [facility], [address], [place], [place] [address]     Terri Piedra, NP [facility] [phone] Pager  03/24/2018  10:34 AM   Patient  seen and examined, agree with above note.  I dictated the care and orders written for this patient under my direction. I was immediately available on site, by phone/page to assist in the care of this patient.

## 2018-03-24 NOTE — Progress Notes (Signed)
Pharmacy Antibiotic Note  Cody Velez is a 72 y.o. male admitted on 03/15/2018 with sepsis.  Pharmacy has been monitoring Ancef dosing for MSSA bacteremia.  Plans for 6 weeks of IV antibiotics and the OPAT has been entered.  Renal function remains stable with creatinine of 0.86 with an estimated clearance > 152ml/min.  Plan: Continue Ancef 2gm IV every 8 hours. Monitor labs, micro and vitals.   Height: 6\' 3"  (190.5 cm) Weight: 295 lb 3 oz (133.9 kg) IBW/kg (Calculated) : 84.5  Temp (24hrs), Avg:98.7 F (37.1 C), Min:98.3 F (36.8 C), Max:99.2 F (37.3 C)  Recent Labs  Lab 03/19/18 0642 03/20/18 0703 03/21/18 0449 03/24/18 0423  WBC 17.6* 14.5* 12.7*  --   CREATININE 0.77 0.81 0.74 0.86    Estimated Creatinine Clearance: 116.2 mL/min (by C-G formula based on SCr of 0.86 mg/dL).    No Known Allergies  Antimicrobials this admission: Vancomycin 3/28 >> 3/30 Zosyn 3/28 >> 3/30 Ancef 3/30 >>   Dose adjustments this admission: n/a   Microbiology results: 3/30 Repeat BCx: pending 3/27 BCx: 1 or 2 = MSSA 3/27 UCx: insignificant growth  Thank you for allowing pharmacy to be a part of this patient's care.  Rober Minion, PharmD., MS Clinical Pharmacist Pager:  [phone] Thank you for allowing pharmacy to be part of this patients care team. 03/24/2018 12:03 PM

## 2018-03-24 NOTE — Progress Notes (Addendum)
PROGRESS NOTE  Cody Velez VHQ:[id number] DOB: [birth year] DOA: 03/15/2018 PCP: Asencion Noble, MD  HPI/Recap of past 24 hours: Cody Velez is a 72 y.o. male with medical history significant for chronic back pain with recent spinal cord stimulator placement several weeks ago, nephrolithiasis with recent admission for UTI, hypertension, dyslipidemia, morbid obesity, and CAD who was brought to the emergency department by family members due to worsening generalized weakness that led to a fall at home.  He actually saw urology earlier today as a follow-up appointment after recent admission to [facility] in [place] where he was noted to have some bilateral nephrolithiasis.  He was apparently prescribed some doxycycline during his urology visit today, likely due to suspicion of urinary tract infection.  He has had progressive confusion throughout the day and was noted to be hypoxemic upon arrival as well as hypoxemic during his urology visit earlier. He denies any dyspnea or chest pain.  No lower extremity edema, orthopnea, or dyspnea on exertion.  He reportedly had a stress test with no concerning findings during this recent hospitalization. He complains of ongoing low back pain that is chronic for him.  03/23/2018: Patient seen and examined at his bedside.  He reports persistent lower back pain and weakness in his lower extremities.  Denies chills or night sweats.  Has no new complaints.  03/24/2018: Patient seen and examined with his friend at his bedside.  Reports pain in his lower back which is improved after he takes pain medications.  Started on bowel regimen.  Assessment/Plan: Principal Problem:   Bacteremia due to methicillin susceptible Staphylococcus aureus (MSSA) Active Problems:   HYPERCHOLESTEROLEMIA   Essential hypertension, benign   CAD, NATIVE VESSEL   GERD   Chronic back pain   History of cerebrovascular disease   Weakness   Fever   Sepsis ([place])   Nephrolithiasis   AKI (acute  kidney injury) ([place])   Acute metabolic encephalopathy   Cognitive impairment   Chronic pain syndrome   Obesity   Staphylococcus aureus sepsis (H[facility])   Encephalopathy   Weakness of both lower extremities   Discitis of thoracic region   Spinal cord stimulator status  Sepsis secondary to MSSA bacteremia On admission culture positive 1 out of 2 bottles for MSSA TEE unremarkable ID following Receiving treatment for potential infection in his lower thoracic spine with questionable involvement of the cervical spine. Continue IV cefazolin day #2 PICC line for prolonged IV antibiotics Plan for IV cefazolin times 6 weeks Possible stimulator removal by neurosurgery Afebrile with no leukocytosis Continue to monitor fever curve Continue to monitor CBC  Generalized weakness/physical debility Continue PT  Euvolemic hyponatremia Sodium 133 Repeat BMP in the morning  Hypokalemia, resolved Potassium 4.3 from 3.4 post repletion Repeat BMP in the morning  Chronic back pain status post spinal stimulator placement Possible stimulator removal by  neurosurgery Continue pain management  Obesity BMI 37 Weight loss outpatient  Depression/anxiety Continue Effexor, trazodone  GERD Continue Protonix.  Constipation Most likely exacerbated by opiates Bowel regimen in place with Senokot and Dulcolax    Code Status: Full  Family Communication: Friend at bedside  Disposition Plan: Home when clinically stable   Consultants:  Infectious disease  Procedures:  None  Antimicrobials:  IV cefazolin  DVT prophylaxis: SCDs   Objective: Vitals:   03/24/18 0533 03/24/18 0827 03/24/18 0838 03/24/18 1300  BP: (!) 141/75 134/80  (!) 143/79  Pulse: 81  84 79  Resp: 18  18   Temp: 99.2  F (37.3 C)     TempSrc: Oral     SpO2: 95%  96% 99%  Weight:      Height:        Intake/Output Summary (Last 24 hours) at 03/24/2018 1656 Last data filed at 03/24/2018 0933 Gross per 24 hour    Intake 240 ml  Output 1500 ml  Net -1260 ml   Filed Weights   03/22/18 0522 03/23/18 0546 03/24/18 0400  Weight: 136 kg (299 lb 13.2 oz) 136 kg (299 lb 13.2 oz) 133.9 kg (295 lb 3 oz)    Exam: 03/24/18   General: 72 year old Caucasian male well-developed well-nourished in no acute distress.  Alert and oriented x3.  Cardiovascular: Regular rate and rhythm with no rubs or gallops.  No JVD or thyromegaly present.  Respiratory: Clear to auscultation with no wheezes or rales.  Abdomen:  Obese nontender nondistended normal bowel sounds x4 quadrant.  Musculoskeletal: Good strength in upper extremities bilaterally  Skin: no ulcerative lesions.  Psychiatry: Mood is appropriate condition and setting.   Data Reviewed: CBC: Recent Labs  Lab 03/19/18 0642 03/20/18 0703 03/21/18 0449 03/24/18 1007  WBC 17.6* 14.5* 12.7* 11.6*  HGB 11.5* 10.9* 11.4* 10.7*  HCT 35.9* 35.2* 37.0* 34.2*  MCV 81.0 83.4 83.9 84.2  PLT 481* 486* 547* 245*   Basic Metabolic Panel: Recent Labs  Lab 03/19/18 0642 03/20/18 0703 03/21/18 0449 03/24/18 0423  NA 132* 134* 136 133*  K 2.9* 3.3* 3.4* 4.3  CL 92* 95* 95* 99*  CO2 28 29 31 26   GLUCOSE 116* 92 96 87  BUN 16 17 13 11   CREATININE 0.77 0.81 0.74 0.86  CALCIUM 8.6* 8.5* 8.2* 8.5*  MG 1.9 2.0  --   --    GFR: Estimated Creatinine Clearance: 116.2 mL/min (by C-G formula based on SCr of 0.86 mg/dL). Liver Function Tests: Recent Labs  Lab 03/19/18 0642 03/20/18 0703  AST 70* 65*  ALT 51 46  ALKPHOS 115 101  BILITOT 1.1 1.1  PROT 6.8 6.9  ALBUMIN 2.0* 2.0*   No results for input(s): LIPASE, AMYLASE in the last 168 hours. Recent Labs  Lab 03/19/18 0642 03/20/18 0703 03/22/18 1201  AMMONIA 36* 32 28   Coagulation Profile: No results for input(s): INR, PROTIME in the last 168 hours. Cardiac Enzymes: No results for input(s): CKTOTAL, CKMB, CKMBINDEX, TROPONINI in the last 168 hours. BNP (last 3 results) No results for input(s):  PROBNP in the last 8760 hours. HbA1C: No results for input(s): HGBA1C in the last 72 hours. CBG: No results for input(s): GLUCAP in the last 168 hours. Lipid Profile: No results for input(s): CHOL, HDL, LDLCALC, TRIG, CHOLHDL, LDLDIRECT in the last 72 hours. Thyroid Function Tests: No results for input(s): TSH, T4TOTAL, FREET4, T3FREE, THYROIDAB in the last 72 hours. Anemia Panel: No results for input(s): VITAMINB12, FOLATE, FERRITIN, TIBC, IRON, RETICCTPCT in the last 72 hours. Urine analysis:    Component Value Date/Time   COLORURINE AMBER (A) 03/15/2018 1830   APPEARANCEUR CLOUDY (A) 03/15/2018 1830   LABSPEC 1.029 03/15/2018 1830   PHURINE 5.0 03/15/2018 1830   GLUCOSEU 50 (A) 03/15/2018 1830   HGBUR MODERATE (A) 03/15/2018 1830   BILIRUBINUR MODERATE (A) 03/15/2018 1830   KETONESUR NEGATIVE 03/15/2018 1830   PROTEINUR 100 (A) 03/15/2018 1830   UROBILINOGEN 0.2 03/05/2011 1909   NITRITE NEGATIVE 03/15/2018 1830   LEUKOCYTESUR NEGATIVE 03/15/2018 1830   Sepsis Labs: @LABRCNTIP (procalcitonin:4,lacticidven:4)  ) Recent Results (from the past 240 hour(s))  Urine  culture     Status: Abnormal   Collection Time: 03/15/18  6:30 PM  Result Value Ref Range Status   Specimen Description   Final    URINE, RANDOM Performed at [facility], [address] [place], [place] [address]    Special Requests   Final    NONE Performed at [facility], [address] [place], [place] [address]    Culture (A)  Final    <10,000 COLONIES/mL INSIGNIFICANT GROWTH Performed at [place] [address], [place], [place] [address]    Report Status 03/17/2018 FINAL  Final  Culture, blood (routine x 2)     Status: Abnormal   Collection Time: 03/15/18  8:00 PM  Result Value Ref Range Status   Specimen Description   Final    BLOOD RIGHT WRIST Performed at [facility], [place] [address] [place], [place] [address]    Special Requests   Final    BOTTLES DRAWN AEROBIC AND  ANAEROBIC Blood Culture adequate volume Performed at [facility] [place] [facility], [address], [place], [place] [address]    Culture  Setup Time   Final    GRAM POSITIVE COCCI IN BOTH AEROBIC AND ANAEROBIC BOTTLES Performed at [facility] Gram Stain Report Called to,Read Back By and Verified With: JOHNSON,B@[id number]  BY MATTHEWS, B 3.30.19 CRITICAL RESULT CALLED TO, READ BACK BY AND VERIFIED WITH: NATE HAYES,PHARMD [id number] BY SJW Performed at [facility], [place] [address], [place], [place] [address]    Culture STAPHYLOCOCCUS AUREUS (A)  Final   Report Status 03/20/2018 FINAL  Final   Organism ID, Bacteria STAPHYLOCOCCUS AUREUS  Final      Susceptibility   Staphylococcus aureus - MIC*    CIPROFLOXACIN <=0.5 SENSITIVE Sensitive     ERYTHROMYCIN 0.5 SENSITIVE Sensitive     GENTAMICIN <=0.5 SENSITIVE Sensitive     OXACILLIN <=0.25 SENSITIVE Sensitive     TETRACYCLINE <=1 SENSITIVE Sensitive     VANCOMYCIN <=0.5 SENSITIVE Sensitive     TRIMETH/SULFA <=10 SENSITIVE Sensitive     CLINDAMYCIN <=0.25 SENSITIVE Sensitive     RIFAMPIN <=0.5 SENSITIVE Sensitive     Inducible Clindamycin NEGATIVE Sensitive     * STAPHYLOCOCCUS AUREUS  Blood Culture ID Panel (Reflexed)     Status: Abnormal   Collection Time: 03/15/18  8:00 PM  Result Value Ref Range Status   Enterococcus species NOT DETECTED NOT DETECTED Final   Listeria monocytogenes NOT DETECTED NOT DETECTED Final   Staphylococcus species DETECTED (A) NOT DETECTED Final    Comment: CRITICAL RESULT CALLED TO, READ BACK BY AND VERIFIED WITH: NATE HAYES PHARMD AT 0954 ON [id number] BY SJW    Staphylococcus aureus DETECTED (A) NOT DETECTED Final    Comment: CRITICAL RESULT CALLED TO, READ BACK BY AND VERIFIED WITH: NATE HAYES PHARMD AT 0954 ON [id number] BY SJW    Methicillin resistance NOT DETECTED NOT DETECTED Final   Streptococcus species NOT DETECTED NOT DETECTED Final   Streptococcus agalactiae NOT DETECTED NOT DETECTED Final   Streptococcus  pneumoniae NOT DETECTED NOT DETECTED Final   Streptococcus pyogenes NOT DETECTED NOT DETECTED Final   Acinetobacter baumannii NOT DETECTED NOT DETECTED Final   Enterobacteriaceae species NOT DETECTED NOT DETECTED Final   Enterobacter cloacae complex NOT DETECTED NOT DETECTED Final   Escherichia coli NOT DETECTED NOT DETECTED Final   Klebsiella oxytoca NOT DETECTED NOT DETECTED Final   Klebsiella pneumoniae NOT DETECTED NOT DETECTED Final   Proteus species NOT DETECTED NOT  DETECTED Final   Serratia marcescens NOT DETECTED NOT DETECTED Final   Haemophilus influenzae NOT DETECTED NOT DETECTED Final   Neisseria meningitidis NOT DETECTED NOT DETECTED Final   Pseudomonas aeruginosa NOT DETECTED NOT DETECTED Final   Candida albicans NOT DETECTED NOT DETECTED Final   Candida glabrata NOT DETECTED NOT DETECTED Final   Candida krusei NOT DETECTED NOT DETECTED Final   Candida parapsilosis NOT DETECTED NOT DETECTED Final   Candida tropicalis NOT DETECTED NOT DETECTED Final    Comment: Performed at [facility], [place] [address], [place], [place] [address]  Culture, blood (routine x 2)     Status: None   Collection Time: 03/15/18  8:21 PM  Result Value Ref Range Status   Specimen Description BLOOD LEFT HAND  Final   Special Requests   Final    BOTTLES DRAWN AEROBIC AND ANAEROBIC Blood Culture adequate volume   Culture   Final    NO GROWTH 5 DAYS Performed at [facility], [address], [place], [place] [address]    Report Status 03/20/2018 FINAL  Final  Respiratory Panel by PCR     Status: None   Collection Time: 03/16/18 11:40 AM  Result Value Ref Range Status   Adenovirus NOT DETECTED NOT DETECTED Final   Coronavirus 229E NOT DETECTED NOT DETECTED Final   Coronavirus HKU1 NOT DETECTED NOT DETECTED Final   Coronavirus NL63 NOT DETECTED NOT DETECTED Final   Coronavirus OC43 NOT DETECTED NOT DETECTED Final   Metapneumovirus NOT DETECTED NOT DETECTED Final   Rhinovirus / Enterovirus NOT  DETECTED NOT DETECTED Final   Influenza A NOT DETECTED NOT DETECTED Final   Influenza B NOT DETECTED NOT DETECTED Final   Parainfluenza Virus 1 NOT DETECTED NOT DETECTED Final   Parainfluenza Virus 2 NOT DETECTED NOT DETECTED Final   Parainfluenza Virus 3 NOT DETECTED NOT DETECTED Final   Parainfluenza Virus 4 NOT DETECTED NOT DETECTED Final   Respiratory Syncytial Virus NOT DETECTED NOT DETECTED Final   Bordetella pertussis NOT DETECTED NOT DETECTED Final   Chlamydophila pneumoniae NOT DETECTED NOT DETECTED Final   Mycoplasma pneumoniae NOT DETECTED NOT DETECTED Final    Comment: Performed at [facility], [place] [address] [place], [place] [address]  Culture, blood (routine x 2)     Status: None   Collection Time: 03/18/18 12:38 PM  Result Value Ref Range Status   Specimen Description LEFT ANTECUBITAL  Final   Special Requests   Final    BOTTLES DRAWN AEROBIC AND ANAEROBIC Blood Culture adequate volume   Culture   Final    NO GROWTH 5 DAYS Performed at [facility], [address] [place], [place] [address]    Report Status 03/23/2018 FINAL  Final  Culture, blood (routine x 2)     Status: None   Collection Time: 03/18/18 12:45 PM  Result Value Ref Range Status   Specimen Description BLOOD LEFT HAND  Final   Special Requests   Final    BOTTLES DRAWN AEROBIC AND ANAEROBIC Blood Culture results may not be optimal due to an inadequate volume of blood received in culture bottles   Culture   Final    NO GROWTH 5 DAYS Performed at [facility], [address], [place], [place] [address]    Report Status 03/23/2018 FINAL  Final  C difficile quick scan w PCR reflex     Status: None   Collection Time: 03/19/18  5:04 PM  Result Value Ref Range Status  C Diff antigen NEGATIVE NEGATIVE Final   C Diff toxin NEGATIVE NEGATIVE Final   C Diff interpretation No C. difficile detected.  Final    Comment: VALID Performed at [facility], [address] [place], [place] [address]     Gastrointestinal Panel by PCR , Stool     Status: None   Collection Time: 03/19/18  5:04 PM  Result Value Ref Range Status   Campylobacter species NOT DETECTED NOT DETECTED Final   Plesimonas shigelloides NOT DETECTED NOT DETECTED Final   Salmonella species NOT DETECTED NOT DETECTED Final   Yersinia enterocolitica NOT DETECTED NOT DETECTED Final   Vibrio species NOT DETECTED NOT DETECTED Final   Vibrio cholerae NOT DETECTED NOT DETECTED Final   Enteroaggregative E coli (EAEC) NOT DETECTED NOT DETECTED Final   Enteropathogenic E coli (EPEC) NOT DETECTED NOT DETECTED Final   Enterotoxigenic E coli (ETEC) NOT DETECTED NOT DETECTED Final   Shiga like toxin producing E coli (STEC) NOT DETECTED NOT DETECTED Final   Shigella/Enteroinvasive E coli (EIEC) NOT DETECTED NOT DETECTED Final   Cryptosporidium NOT DETECTED NOT DETECTED Final   Cyclospora cayetanensis NOT DETECTED NOT DETECTED Final   Entamoeba histolytica NOT DETECTED NOT DETECTED Final   Giardia lamblia NOT DETECTED NOT DETECTED Final   Adenovirus F40/41 NOT DETECTED NOT DETECTED Final   Astrovirus NOT DETECTED NOT DETECTED Final   Norovirus GI/GII NOT DETECTED NOT DETECTED Final   Rotavirus A NOT DETECTED NOT DETECTED Final   Sapovirus (I, II, IV, and V) NOT DETECTED NOT DETECTED Final    Comment: Performed at [facility], [address] [place], [place] [address]      Studies: No results found.  Scheduled Meds: . amLODipine  10 mg Oral Daily  . aspirin EC  81 mg Oral Daily  . chlorhexidine  15 mL Mouth Rinse BID  . cycloSPORINE  1 drop Both Eyes BID  . donepezil  10 mg Oral QHS  . enoxaparin (LOVENOX) injection  40 mg Subcutaneous Q24H  . finasteride  5 mg Oral Daily  . gabapentin  600 mg Oral TID  . ipratropium-albuterol  3 mL Nebulization BID  . latanoprost  1 drop Both Eyes QHS  . mouth rinse  15 mL Mouth Rinse q12n4p  . memantine  10 mg Oral BID  . pantoprazole  40 mg Oral Daily  . potassium chloride   20 mEq Oral BID  . pravastatin  10 mg Oral QPM  . saccharomyces boulardii  250 mg Oral BID WC  . senna-docusate  1 tablet Oral BID  . sodium chloride flush  10-40 mL Intracatheter Q12H  . traZODone  100 mg Oral QHS  . venlafaxine XR  150 mg Oral BID  . vitamin B-12  1,000 mcg Oral Daily    Continuous Infusions: .  ceFAZolin (ANCEF) IV Stopped (03/24/18 0854)     LOS: 8 days     Kayleen Memos, MD [facility] Pager [phone]  If 7PM-7AM, please contact night-coverage [email or web address] Password [facility] 03/24/2018, 4:56 PM

## 2018-03-25 NOTE — Progress Notes (Signed)
Patient has home CPAP.  RT filled water chamber for patient and patient stated he needed no assistance with applying once he was ready to go to bed.

## 2018-03-25 NOTE — Progress Notes (Addendum)
PROGRESS NOTE  Cody Velez KPT:[id number] DOB: Aug 08, 1946 DOA: 03/15/2018 PCP: Asencion Noble, MD  HPI/Recap of past 24 hours: Cody Velez is a 72 y.o. male with medical history significant for chronic back pain with recent spinal cord stimulator placement several weeks ago, nephrolithiasis with recent admission for UTI, hypertension, dyslipidemia, morbid obesity, and CAD who was brought to the emergency department by family members due to worsening generalized weakness that led to a fall at home.  He actually saw urology earlier today as a follow-up appointment after recent admission to [facility] in [place] where he was noted to have some bilateral nephrolithiasis.  He was apparently prescribed some doxycycline during his urology visit today, likely due to suspicion of urinary tract infection.  He has had progressive confusion throughout the day and was noted to be hypoxemic upon arrival as well as hypoxemic during his urology visit earlier. He denies any dyspnea or chest pain.  No lower extremity edema, orthopnea, or dyspnea on exertion.  He reportedly had a stress test with no concerning findings during this recent hospitalization. He complains of ongoing low back pain that is chronic for him.  03/23/2018: Patient seen and examined at his bedside.  He reports persistent lower back pain and weakness in his lower extremities.  Denies chills or night sweats.  Has no new complaints.  03/24/2018: Patient seen and examined with his friend at his bedside.  Reports pain in his lower back which is improved after he takes pain medications.  Started on bowel regimen.  03/25/18: No new complaints. Report his back pain at its worse is 10/10 after pain med 5/10.  Assessment/Plan: Principal Problem:   Bacteremia due to methicillin susceptible Staphylococcus aureus (MSSA) Active Problems:   HYPERCHOLESTEROLEMIA   Essential hypertension, benign   CAD, NATIVE VESSEL   GERD   Chronic back pain   History of  cerebrovascular disease   Weakness   Fever   Sepsis ([place])   Nephrolithiasis   AKI (acute kidney injury) ([place])   Acute metabolic encephalopathy   Cognitive impairment   Chronic pain syndrome   Obesity   Staphylococcus aureus sepsis (HCC)   Encephalopathy   Weakness of both lower extremities   Discitis of thoracic region   Spinal cord stimulator status  Sepsis secondary to MSSA bacteremia On admission culture positive 1 out of 2 bottles for MSSA TEE unremarkable ID following Receiving treatment for potential infection in his lower thoracic spine with questionable involvement of the cervical spine. Continue IV cefazolin day #3 PICC line for prolonged IV antibiotics Plan for IV cefazolin times 6 weeks Possible stimulator removal by neurosurgery Afebrile with no leukocytosis Continue to monitor fever curve Continue to monitor CBC  Generalized weakness/physical debility Continue PT Increase po intake  Hyponatremia, euvolemic Na+ 133 Monitor  Hypokalemia, resolved Potassium 4.3 from 3.4 post repletion Repeat BMP in the morning  Chronic back pain status post spinal stimulator placement Possible stimulator removal by  neurosurgery Continue pain management  Obesity BMI 37 Weight loss outpatient  Depression/anxiety Continue Effexor, trazodone  GERD Continue Protonix.  Constipation Most likely exacerbated by opiates Bowel regimen in place with Senokot and Dulcolax    Code Status: Full  Family Communication: Friend at bedside  Disposition Plan: Home when clinically stable   Consultants:  Infectious disease  Procedures:  None  Antimicrobials:  IV cefazolin  DVT prophylaxis: SCDs, sq lovenox daily  Objective: Vitals:   03/24/18 2147 03/25/18 0336 03/25/18 0449 03/25/18 0838  BP: (!) 149/86  Marland Kitchen)  144/84   Pulse: 74  76   Resp: 18  18   Temp: 98.7 F (37.1 C)  98.7 F (37.1 C)   TempSrc: Oral     SpO2: 94%  96% 97%  Weight:  131.9 kg (290 lb  12.6 oz)    Height:        Intake/Output Summary (Last 24 hours) at 03/25/2018 1536 Last data filed at 03/25/2018 1322 Gross per 24 hour  Intake -  Output 3400 ml  Net -3400 ml   Filed Weights   03/23/18 0546 03/24/18 0400 03/25/18 0336  Weight: 136 kg (299 lb 13.2 oz) 133.9 kg (295 lb 3 oz) 131.9 kg (290 lb 12.6 oz)    Exam: 03/25/18  General: 72 yo CM WD WN NAD A&O x 3  Cardiovascular: RRR no rubs or gallops. No JVD or thyromegaly.  Respiratory: Clear to auscultation with no wheezes or rales.  Abdomen:  Obese nontender nondistended normal bowel sounds x4 quadrant.  Musculoskeletal: Good strength in upper extremities bilaterally  Skin: no ulcerative lesions.  Psychiatry: Mood is appropriate condition and setting.   Data Reviewed: CBC: Recent Labs  Lab 03/19/18 0642 03/20/18 0703 03/21/18 0449 03/24/18 1007  WBC 17.6* 14.5* 12.7* 11.6*  HGB 11.5* 10.9* 11.4* 10.7*  HCT 35.9* 35.2* 37.0* 34.2*  MCV 81.0 83.4 83.9 84.2  PLT 481* 486* 547* 235*   Basic Metabolic Panel: Recent Labs  Lab 03/19/18 0642 03/20/18 0703 03/21/18 0449 03/24/18 0423  NA 132* 134* 136 133*  K 2.9* 3.3* 3.4* 4.3  CL 92* 95* 95* 99*  CO2 28 29 31 26   GLUCOSE 116* 92 96 87  BUN 16 17 13 11   CREATININE 0.77 0.81 0.74 0.86  CALCIUM 8.6* 8.5* 8.2* 8.5*  MG 1.9 2.0  --   --    GFR: Estimated Creatinine Clearance: 115.3 mL/min (by C-G formula based on SCr of 0.86 mg/dL). Liver Function Tests: Recent Labs  Lab 03/19/18 0642 03/20/18 0703  AST 70* 65*  ALT 51 46  ALKPHOS 115 101  BILITOT 1.1 1.1  PROT 6.8 6.9  ALBUMIN 2.0* 2.0*   No results for input(s): LIPASE, AMYLASE in the last 168 hours. Recent Labs  Lab 03/19/18 0642 03/20/18 0703 03/22/18 1201  AMMONIA 36* 32 28   Coagulation Profile: No results for input(s): INR, PROTIME in the last 168 hours. Cardiac Enzymes: No results for input(s): CKTOTAL, CKMB, CKMBINDEX, TROPONINI in the last 168 hours. BNP (last 3  results) No results for input(s): PROBNP in the last 8760 hours. HbA1C: No results for input(s): HGBA1C in the last 72 hours. CBG: No results for input(s): GLUCAP in the last 168 hours. Lipid Profile: No results for input(s): CHOL, HDL, LDLCALC, TRIG, CHOLHDL, LDLDIRECT in the last 72 hours. Thyroid Function Tests: No results for input(s): TSH, T4TOTAL, FREET4, T3FREE, THYROIDAB in the last 72 hours. Anemia Panel: No results for input(s): VITAMINB12, FOLATE, FERRITIN, TIBC, IRON, RETICCTPCT in the last 72 hours. Urine analysis:    Component Value Date/Time   COLORURINE AMBER (A) 03/15/2018 1830   APPEARANCEUR CLOUDY (A) 03/15/2018 1830   LABSPEC 1.029 03/15/2018 1830   PHURINE 5.0 03/15/2018 1830   GLUCOSEU 50 (A) 03/15/2018 1830   HGBUR MODERATE (A) 03/15/2018 1830   BILIRUBINUR MODERATE (A) 03/15/2018 1830   KETONESUR NEGATIVE 03/15/2018 1830   PROTEINUR 100 (A) 03/15/2018 1830   UROBILINOGEN 0.2 03/05/2011 1909   NITRITE NEGATIVE 03/15/2018 1830   LEUKOCYTESUR NEGATIVE 03/15/2018 1830   Sepsis Labs: @LABRCNTIP (procalcitonin:4,lacticidven:4)  )  Recent Results (from the past 240 hour(s))  Urine culture     Status: Abnormal   Collection Time: 03/15/18  6:30 PM  Result Value Ref Range Status   Specimen Description   Final    URINE, RANDOM Performed at [facility], [address] [place], [place] [address]    Special Requests   Final    NONE Performed at [facility], [address] [place], [place] [address]    Culture (A)  Final    <10,000 COLONIES/mL INSIGNIFICANT GROWTH Performed at [facility] [address], [place], [place] [address]    Report Status 03/17/2018 FINAL  Final  Culture, blood (routine x 2)     Status: Abnormal   Collection Time: 03/15/18  8:00 PM  Result Value Ref Range Status   Specimen Description   Final    BLOOD RIGHT WRIST Performed at [facility], [place] [address], [place], [place] [address]    Special Requests   Final     BOTTLES DRAWN AEROBIC AND ANAEROBIC Blood Culture adequate volume Performed at [facility], [address] [place], [place] [address]    Culture  Setup Time   Final    GRAM POSITIVE COCCI IN BOTH AEROBIC AND ANAEROBIC BOTTLES Performed at [facility] Gram Stain Report Called to,Read Back By and Verified With: JOHNSON,[email or web address]  BY MATTHEWS, B 3.30.19 CRITICAL RESULT CALLED TO, READ BACK BY AND VERIFIED WITH: NATE HAYES,PHARMD [id number] BY SJW Performed at [facility], [place] [address] [place], [place] [address]    Culture STAPHYLOCOCCUS AUREUS (A)  Final   Report Status 03/20/2018 FINAL  Final   Organism ID, Bacteria STAPHYLOCOCCUS AUREUS  Final      Susceptibility   Staphylococcus aureus - MIC*    CIPROFLOXACIN <=0.5 SENSITIVE Sensitive     ERYTHROMYCIN 0.5 SENSITIVE Sensitive     GENTAMICIN <=0.5 SENSITIVE Sensitive     OXACILLIN <=0.25 SENSITIVE Sensitive     TETRACYCLINE <=1 SENSITIVE Sensitive     VANCOMYCIN <=0.5 SENSITIVE Sensitive     TRIMETH/SULFA <=10 SENSITIVE Sensitive     CLINDAMYCIN <=0.25 SENSITIVE Sensitive     RIFAMPIN <=0.5 SENSITIVE Sensitive     Inducible Clindamycin NEGATIVE Sensitive     * STAPHYLOCOCCUS AUREUS  Blood Culture ID Panel (Reflexed)     Status: Abnormal   Collection Time: 03/15/18  8:00 PM  Result Value Ref Range Status   Enterococcus species NOT DETECTED NOT DETECTED Final   Listeria monocytogenes NOT DETECTED NOT DETECTED Final   Staphylococcus species DETECTED (A) NOT DETECTED Final    Comment: CRITICAL RESULT CALLED TO, READ BACK BY AND VERIFIED WITH: NATE HAYES PHARMD AT 0954 ON [id number] BY SJW    Staphylococcus aureus DETECTED (A) NOT DETECTED Final    Comment: CRITICAL RESULT CALLED TO, READ BACK BY AND VERIFIED WITH: NATE HAYES PHARMD AT 0954 ON [id number] BY SJW    Methicillin resistance NOT DETECTED NOT DETECTED Final   Streptococcus species NOT DETECTED NOT DETECTED Final   Streptococcus agalactiae NOT DETECTED NOT DETECTED  Final   Streptococcus pneumoniae NOT DETECTED NOT DETECTED Final   Streptococcus pyogenes NOT DETECTED NOT DETECTED Final   Acinetobacter baumannii NOT DETECTED NOT DETECTED Final   Enterobacteriaceae species NOT DETECTED NOT DETECTED Final   Enterobacter cloacae complex NOT DETECTED NOT DETECTED Final   Escherichia coli NOT DETECTED NOT DETECTED Final   Klebsiella oxytoca NOT DETECTED NOT DETECTED Final   Klebsiella pneumoniae NOT DETECTED NOT  DETECTED Final   Proteus species NOT DETECTED NOT DETECTED Final   Serratia marcescens NOT DETECTED NOT DETECTED Final   Haemophilus influenzae NOT DETECTED NOT DETECTED Final   Neisseria meningitidis NOT DETECTED NOT DETECTED Final   Pseudomonas aeruginosa NOT DETECTED NOT DETECTED Final   Candida albicans NOT DETECTED NOT DETECTED Final   Candida glabrata NOT DETECTED NOT DETECTED Final   Candida krusei NOT DETECTED NOT DETECTED Final   Candida parapsilosis NOT DETECTED NOT DETECTED Final   Candida tropicalis NOT DETECTED NOT DETECTED Final    Comment: Performed at [facility], [place] [address], [place], [place] [address]  Culture, blood (routine x 2)     Status: None   Collection Time: 03/15/18  8:21 PM  Result Value Ref Range Status   Specimen Description BLOOD LEFT HAND  Final   Special Requests   Final    BOTTLES DRAWN AEROBIC AND ANAEROBIC Blood Culture adequate volume   Culture   Final    NO GROWTH 5 DAYS Performed at [facility], [address] [place], [place] [address]    Report Status 03/20/2018 FINAL  Final  Respiratory Panel by PCR     Status: None   Collection Time: 03/16/18 11:40 AM  Result Value Ref Range Status   Adenovirus NOT DETECTED NOT DETECTED Final   Coronavirus 229E NOT DETECTED NOT DETECTED Final   Coronavirus HKU1 NOT DETECTED NOT DETECTED Final   Coronavirus NL63 NOT DETECTED NOT DETECTED Final   Coronavirus OC43 NOT DETECTED NOT DETECTED Final   Metapneumovirus NOT DETECTED NOT DETECTED Final    Rhinovirus / Enterovirus NOT DETECTED NOT DETECTED Final   Influenza A NOT DETECTED NOT DETECTED Final   Influenza B NOT DETECTED NOT DETECTED Final   Parainfluenza Virus 1 NOT DETECTED NOT DETECTED Final   Parainfluenza Virus 2 NOT DETECTED NOT DETECTED Final   Parainfluenza Virus 3 NOT DETECTED NOT DETECTED Final   Parainfluenza Virus 4 NOT DETECTED NOT DETECTED Final   Respiratory Syncytial Virus NOT DETECTED NOT DETECTED Final   Bordetella pertussis NOT DETECTED NOT DETECTED Final   Chlamydophila pneumoniae NOT DETECTED NOT DETECTED Final   Mycoplasma pneumoniae NOT DETECTED NOT DETECTED Final    Comment: Performed at [facility], [place] [address], [place], [place] [address]  Culture, blood (routine x 2)     Status: None   Collection Time: 03/18/18 12:38 PM  Result Value Ref Range Status   Specimen Description LEFT ANTECUBITAL  Final   Special Requests   Final    BOTTLES DRAWN AEROBIC AND ANAEROBIC Blood Culture adequate volume   Culture   Final    NO GROWTH 5 DAYS Performed at [facility], [address] [place], [place] [address]    Report Status 03/23/2018 FINAL  Final  Culture, blood (routine x 2)     Status: None   Collection Time: 03/18/18 12:45 PM  Result Value Ref Range Status   Specimen Description BLOOD LEFT HAND  Final   Special Requests   Final    BOTTLES DRAWN AEROBIC AND ANAEROBIC Blood Culture results may not be optimal due to an inadequate volume of blood received in culture bottles   Culture   Final    NO GROWTH 5 DAYS Performed at [facility], [address], [place], [place] [address]    Report Status 03/23/2018 FINAL  Final  C difficile quick scan w PCR reflex     Status: None   Collection Time: 03/19/18  5:04 PM  Result Value Ref Range Status   C Diff antigen NEGATIVE NEGATIVE Final   C Diff toxin NEGATIVE NEGATIVE Final   C Diff interpretation No C. difficile detected.  Final    Comment: VALID Performed at [facility], [address],  [place], [place] [address]   Gastrointestinal Panel by PCR , Stool     Status: None   Collection Time: 03/19/18  5:04 PM  Result Value Ref Range Status   Campylobacter species NOT DETECTED NOT DETECTED Final   Plesimonas shigelloides NOT DETECTED NOT DETECTED Final   Salmonella species NOT DETECTED NOT DETECTED Final   Yersinia enterocolitica NOT DETECTED NOT DETECTED Final   Vibrio species NOT DETECTED NOT DETECTED Final   Vibrio cholerae NOT DETECTED NOT DETECTED Final   Enteroaggregative E coli (EAEC) NOT DETECTED NOT DETECTED Final   Enteropathogenic E coli (EPEC) NOT DETECTED NOT DETECTED Final   Enterotoxigenic E coli (ETEC) NOT DETECTED NOT DETECTED Final   Shiga like toxin producing E coli (STEC) NOT DETECTED NOT DETECTED Final   Shigella/Enteroinvasive E coli (EIEC) NOT DETECTED NOT DETECTED Final   Cryptosporidium NOT DETECTED NOT DETECTED Final   Cyclospora cayetanensis NOT DETECTED NOT DETECTED Final   Entamoeba histolytica NOT DETECTED NOT DETECTED Final   Giardia lamblia NOT DETECTED NOT DETECTED Final   Adenovirus F40/41 NOT DETECTED NOT DETECTED Final   Astrovirus NOT DETECTED NOT DETECTED Final   Norovirus GI/GII NOT DETECTED NOT DETECTED Final   Rotavirus A NOT DETECTED NOT DETECTED Final   Sapovirus (I, II, IV, and V) NOT DETECTED NOT DETECTED Final    Comment: Performed at [facility], [address] [place], [place] [address]      Studies: No results found.  Scheduled Meds: . amLODipine  10 mg Oral Daily  . aspirin EC  81 mg Oral Daily  . chlorhexidine  15 mL Mouth Rinse BID  . cycloSPORINE  1 drop Both Eyes BID  . donepezil  10 mg Oral QHS  . enoxaparin (LOVENOX) injection  40 mg Subcutaneous Q24H  . finasteride  5 mg Oral Daily  . gabapentin  600 mg Oral TID  . ipratropium-albuterol  3 mL Nebulization BID  . latanoprost  1 drop Both Eyes QHS  . mouth rinse  15 mL Mouth Rinse q12n4p  . memantine  10 mg Oral BID  . pantoprazole  40 mg Oral Daily   . potassium chloride  20 mEq Oral BID  . pravastatin  10 mg Oral QPM  . saccharomyces boulardii  250 mg Oral BID WC  . senna-docusate  1 tablet Oral BID  . sodium chloride flush  10-40 mL Intracatheter Q12H  . traZODone  100 mg Oral QHS  . venlafaxine XR  150 mg Oral BID  . vitamin B-12  1,000 mcg Oral Daily    Continuous Infusions: .  ceFAZolin (ANCEF) IV Stopped (03/25/18 1101)     LOS: 9 days     Kayleen Memos, MD [facility] Pager [phone]  If 7PM-7AM, please contact night-coverage [email or web address] Password [facility] 03/25/2018, 3:36 PM

## 2018-03-26 LAB — CBC
HCT: 35.2 % — ABNORMAL LOW (ref 39.0–52.0)
Hemoglobin: 11.2 g/dL — ABNORMAL LOW (ref 13.0–17.0)
MCH: 27 pg (ref 26.0–34.0)
MCHC: 31.8 g/dL (ref 30.0–36.0)
MCV: 84.8 fL (ref 78.0–100.0)
Platelets: 732 10*3/uL — ABNORMAL HIGH (ref 150–400)
RBC: 4.15 MIL/uL — ABNORMAL LOW (ref 4.22–5.81)
RDW: 17.1 % — ABNORMAL HIGH (ref 11.5–15.5)
WBC: 9.6 10*3/uL (ref 4.0–10.5)

## 2018-03-26 LAB — COMPREHENSIVE METABOLIC PANEL
ALT: 36 U/L (ref 17–63)
AST: 53 U/L — ABNORMAL HIGH (ref 15–41)
Albumin: 2 g/dL — ABNORMAL LOW (ref 3.5–5.0)
Alkaline Phosphatase: 105 U/L (ref 38–126)
Anion gap: 8 (ref 5–15)
BUN: 15 mg/dL (ref 6–20)
CO2: 26 mmol/L (ref 22–32)
Calcium: 8.9 mg/dL (ref 8.9–10.3)
Chloride: 99 mmol/L — ABNORMAL LOW (ref 101–111)
Creatinine, Ser: 0.97 mg/dL (ref 0.61–1.24)
GFR calc Af Amer: >60 mL/min (ref >60)
GFR calc non Af Amer: >60 mL/min (ref >60)
Glucose, Bld: 105 mg/dL — ABNORMAL HIGH (ref 65–99)
Potassium: 4.7 mmol/L (ref 3.5–5.1)
Sodium: 133 mmol/L — ABNORMAL LOW (ref 135–145)
Total Bilirubin: 0.3 mg/dL (ref 0.3–1.2)
Total Protein: 7.8 g/dL (ref 6.5–8.1)

## 2018-03-26 MED ORDER — ENOXAPARIN SODIUM 60 MG/0.6ML ~~LOC~~ SOLN
60.0000 mg | SUBCUTANEOUS | Status: DC
  Administered 2018-03-27: 60 mg via SUBCUTANEOUS
  Filled 2018-03-26: qty 0.6

## 2018-03-26 MED ORDER — POLYETHYLENE GLYCOL 3350 17 G PO PACK
17.0000 g | PACK | Freq: Every day | ORAL | Status: DC
  Administered 2018-03-26 – 2018-03-27 (×2): 17 g via ORAL
  Filled 2018-03-26 (×2): qty 1

## 2018-03-26 MED ORDER — HYDROMORPHONE HCL 1 MG/ML IJ SOLN
1.0000 mg | INTRAMUSCULAR | Status: DC | PRN
  Administered 2018-03-26 – 2018-03-27 (×4): 1 mg via INTRAVENOUS
  Filled 2018-03-26 (×4): qty 1

## 2018-03-26 MED ORDER — ENSURE ENLIVE PO LIQD
237.0000 mL | Freq: Two times a day (BID) | ORAL | Status: DC
  Administered 2018-03-26 – 2018-03-30 (×6): 237 mL via ORAL

## 2018-03-26 NOTE — Progress Notes (Addendum)
PROGRESS NOTE  Cody Velez FAO:[id number] DOB: 21-Jun-1946 DOA: 03/15/2018 PCP: Asencion Noble, MD  HPI/Recap of past 24 hours: Cody Velez is a 72 y.o. male with medical history significant for chronic back pain with recent spinal cord stimulator placement several weeks ago, nephrolithiasis with recent admission for UTI, hypertension, dyslipidemia, morbid obesity, and CAD who was brought to the emergency department by family members due to worsening generalized weakness that led to a fall at home.  He actually saw urology earlier today as a follow-up appointment after recent admission to [facility] in [place] where he was noted to have some bilateral nephrolithiasis.  He was apparently prescribed some doxycycline during his urology visit today, likely due to suspicion of urinary tract infection.  He has had progressive confusion throughout the day and was noted to be hypoxemic upon arrival as well as hypoxemic during his urology visit earlier. He denies any dyspnea or chest pain.  No lower extremity edema, orthopnea, or dyspnea on exertion.  He reportedly had a stress test with no concerning findings during this recent hospitalization. He complains of ongoing low back pain that is chronic for him.  03/23/2018: Patient seen and examined at his bedside.  He reports persistent lower back pain and weakness in his lower extremities.  Denies chills or night sweats.  Has no new complaints.  03/24/2018: Patient seen and examined with his friend at his bedside.  Reports pain in his lower back which is improved after he takes pain medications.  Started on bowel regimen.  03/25/18: No new complaints. Report his back pain at its worse is 10/10 after pain med 5/10.  03/26/18 severe dull lower back pain reported this am. IV dilaudid provided prn. Denies nausea or abd pian.  Assessment/Plan: Principal Problem:   Bacteremia due to methicillin susceptible Staphylococcus aureus (MSSA) Active Problems:  HYPERCHOLESTEROLEMIA   Essential hypertension, benign   CAD, NATIVE VESSEL   GERD   Chronic back pain   History of cerebrovascular disease   Weakness   Fever   Sepsis ([place])   Nephrolithiasis   AKI (acute kidney injury) ([place])   Acute metabolic encephalopathy   Cognitive impairment   Chronic pain syndrome   Obesity   Staphylococcus aureus sepsis ([facility])   Encephalopathy   Weakness of both lower extremities   Discitis of thoracic region   Spinal cord stimulator status  Sepsis secondary to MSSA bacteremia On admission culture positive 1 out of 2 bottles for MSSA TEE unremarkable ID following Receiving treatment for potential infection in his lower thoracic spine with questionable involvement of the cervical spine. Continue IV cefazolin day #4 PICC line for prolonged IV antibiotics Plan for IV cefazolin times 6 weeks Possible stimulator removal by neurosurgery Afebrile with no leukocytosis Continue to monitor fever curve Continue to monitor CBC  Intractable lower back pain Added IV dilaudid today as needed Bowel regimen in place to avoid opioids induced constipation   Generalized weakness/physical debility Continue PT Increase po intake  Hyponatremia, euvolemic Na+ 133 Repeat labs am  Hypokalemia, resolved Potassium 4.3 from 3.4 post repletion Repeat BMP in the morning  Chronic back pain status post spinal stimulator placement Possible stimulator removal by  neurosurgery Continue pain management  Obesity BMI 37 Weight loss outpatient  Depression/anxiety Continue Effexor, trazodone  GERD Continue Protonix.  Constipation Most likely exacerbated by opiates Bowel regimen in place with Senokot and Dulcolax    Code Status: Full  Family Communication: Friend at bedside  Disposition Plan: Home when clinically  stable   Consultants:  Infectious disease  neurosurgery  Procedures:  None  Antimicrobials:  IV cefazolin  DVT prophylaxis: SCDs, sq  lovenox daily  Objective: Vitals:   03/25/18 0838 03/25/18 2106 03/25/18 2110 03/26/18 0531  BP:  124/82  122/87  Pulse:  76  79  Resp:  16  16  Temp:  98.3 F (36.8 C)  98 F (36.7 C)  TempSrc:  Oral  Axillary  SpO2: 97% 97% 97% 96%  Weight:    131.7 kg (290 lb 5.5 oz)  Height:        Intake/Output Summary (Last 24 hours) at 03/26/2018 1525 Last data filed at 03/26/2018 0531 Gross per 24 hour  Intake -  Output 1910 ml  Net -1910 ml   Filed Weights   03/24/18 0400 03/25/18 0336 03/26/18 0531  Weight: 133.9 kg (295 lb 3 oz) 131.9 kg (290 lb 12.6 oz) 131.7 kg (290 lb 5.5 oz)    Exam: 03/26/18  General: 72 yo CM WD wN NAd A&O x 3  Cardiovascular: RRR no rubs or gallops. No JVD or thyromegaly  Respiratory: Clear to auscultation with no wheezes or rales.  Abdomen:  Obese nontender nondistended normal bowel sounds x4 quadrant.  Musculoskeletal: Good strength in upper extremities bilaterally  Skin: no ulcerative lesions.  Psychiatry: Mood is appropriate condition and setting.   Data Reviewed: CBC: Recent Labs  Lab 03/20/18 0703 03/21/18 0449 03/24/18 1007  WBC 14.5* 12.7* 11.6*  HGB 10.9* 11.4* 10.7*  HCT 35.2* 37.0* 34.2*  MCV 83.4 83.9 84.2  PLT 486* 547* 902*   Basic Metabolic Panel: Recent Labs  Lab 03/20/18 0703 03/21/18 0449 03/24/18 0423  NA 134* 136 133*  K 3.3* 3.4* 4.3  CL 95* 95* 99*  CO2 29 31 26   GLUCOSE 92 96 87  BUN 17 13 11   CREATININE 0.81 0.74 0.86  CALCIUM 8.5* 8.2* 8.5*  MG 2.0  --   --    GFR: Estimated Creatinine Clearance: 115.2 mL/min (by C-G formula based on SCr of 0.86 mg/dL). Liver Function Tests: Recent Labs  Lab 03/20/18 0703  AST 65*  ALT 46  ALKPHOS 101  BILITOT 1.1  PROT 6.9  ALBUMIN 2.0*   No results for input(s): LIPASE, AMYLASE in the last 168 hours. Recent Labs  Lab 03/20/18 0703 03/22/18 1201  AMMONIA 32 28   Coagulation Profile: No results for input(s): INR, PROTIME in the last 168 hours. Cardiac  Enzymes: No results for input(s): CKTOTAL, CKMB, CKMBINDEX, TROPONINI in the last 168 hours. BNP (last 3 results) No results for input(s): PROBNP in the last 8760 hours. HbA1C: No results for input(s): HGBA1C in the last 72 hours. CBG: No results for input(s): GLUCAP in the last 168 hours. Lipid Profile: No results for input(s): CHOL, HDL, LDLCALC, TRIG, CHOLHDL, LDLDIRECT in the last 72 hours. Thyroid Function Tests: No results for input(s): TSH, T4TOTAL, FREET4, T3FREE, THYROIDAB in the last 72 hours. Anemia Panel: No results for input(s): VITAMINB12, FOLATE, FERRITIN, TIBC, IRON, RETICCTPCT in the last 72 hours. Urine analysis:    Component Value Date/Time   COLORURINE AMBER (A) 03/15/2018 1830   APPEARANCEUR CLOUDY (A) 03/15/2018 1830   LABSPEC 1.029 03/15/2018 1830   PHURINE 5.0 03/15/2018 1830   GLUCOSEU 50 (A) 03/15/2018 1830   HGBUR MODERATE (A) 03/15/2018 1830   BILIRUBINUR MODERATE (A) 03/15/2018 1830   KETONESUR NEGATIVE 03/15/2018 1830   PROTEINUR 100 (A) 03/15/2018 1830   UROBILINOGEN 0.2 03/05/2011 1909   NITRITE NEGATIVE  03/15/2018 1830   LEUKOCYTESUR NEGATIVE 03/15/2018 1830   Sepsis Labs: @LABRCNTIP (procalcitonin:4,lacticidven:4)  ) Recent Results (from the past 240 hour(s))  Culture, blood (routine x 2)     Status: None   Collection Time: 03/18/18 12:38 PM  Result Value Ref Range Status   Specimen Description LEFT ANTECUBITAL  Final   Special Requests   Final    BOTTLES DRAWN AEROBIC AND ANAEROBIC Blood Culture adequate volume   Culture   Final    NO GROWTH 5 DAYS Performed at [facility], [address] [place], [place] [address]    Report Status 03/23/2018 FINAL  Final  Culture, blood (routine x 2)     Status: None   Collection Time: 03/18/18 12:45 PM  Result Value Ref Range Status   Specimen Description BLOOD LEFT HAND  Final   Special Requests   Final    BOTTLES DRAWN AEROBIC AND ANAEROBIC Blood Culture results may not be optimal due to an  inadequate volume of blood received in culture bottles   Culture   Final    NO GROWTH 5 DAYS Performed at [facility], [address] [place], [place] [address]    Report Status 03/23/2018 FINAL  Final  C difficile quick scan w PCR reflex     Status: None   Collection Time: 03/19/18  5:04 PM  Result Value Ref Range Status   C Diff antigen NEGATIVE NEGATIVE Final   C Diff toxin NEGATIVE NEGATIVE Final   C Diff interpretation No C. difficile detected.  Final    Comment: VALID Performed at [facility], [address] [place], [place] [address]   Gastrointestinal Panel by PCR , Stool     Status: None   Collection Time: 03/19/18  5:04 PM  Result Value Ref Range Status   Campylobacter species NOT DETECTED NOT DETECTED Final   Plesimonas shigelloides NOT DETECTED NOT DETECTED Final   Salmonella species NOT DETECTED NOT DETECTED Final   Yersinia enterocolitica NOT DETECTED NOT DETECTED Final   Vibrio species NOT DETECTED NOT DETECTED Final   Vibrio cholerae NOT DETECTED NOT DETECTED Final   Enteroaggregative E coli (EAEC) NOT DETECTED NOT DETECTED Final   Enteropathogenic E coli (EPEC) NOT DETECTED NOT DETECTED Final   Enterotoxigenic E coli (ETEC) NOT DETECTED NOT DETECTED Final   Shiga like toxin producing E coli (STEC) NOT DETECTED NOT DETECTED Final   Shigella/Enteroinvasive E coli (EIEC) NOT DETECTED NOT DETECTED Final   Cryptosporidium NOT DETECTED NOT DETECTED Final   Cyclospora cayetanensis NOT DETECTED NOT DETECTED Final   Entamoeba histolytica NOT DETECTED NOT DETECTED Final   Giardia lamblia NOT DETECTED NOT DETECTED Final   Adenovirus F40/41 NOT DETECTED NOT DETECTED Final   Astrovirus NOT DETECTED NOT DETECTED Final   Norovirus GI/GII NOT DETECTED NOT DETECTED Final   Rotavirus A NOT DETECTED NOT DETECTED Final   Sapovirus (I, II, IV, and V) NOT DETECTED NOT DETECTED Final    Comment: Performed at [facility], [address] [place],  [address]       Studies: No results found.  Scheduled Meds: . amLODipine  10 mg Oral Daily  . aspirin EC  81 mg Oral Daily  . chlorhexidine  15 mL Mouth Rinse BID  . cycloSPORINE  1 drop Both Eyes BID  . donepezil  10 mg Oral QHS  . [START ON 03/27/2018] enoxaparin (LOVENOX) injection  60 mg Subcutaneous Q24H  . feeding supplement (ENSURE ENLIVE)  237 mL Oral BID BM  .  finasteride  5 mg Oral Daily  . gabapentin  600 mg Oral TID  . ipratropium-albuterol  3 mL Nebulization BID  . latanoprost  1 drop Both Eyes QHS  . mouth rinse  15 mL Mouth Rinse q12n4p  . memantine  10 mg Oral BID  . pantoprazole  40 mg Oral Daily  . polyethylene glycol  17 g Oral Daily  . potassium chloride  20 mEq Oral BID  . pravastatin  10 mg Oral QPM  . saccharomyces boulardii  250 mg Oral BID WC  . senna-docusate  1 tablet Oral BID  . sodium chloride flush  10-40 mL Intracatheter Q12H  . traZODone  100 mg Oral QHS  . venlafaxine XR  150 mg Oral BID  . vitamin B-12  1,000 mcg Oral Daily    Continuous Infusions: .  ceFAZolin (ANCEF) IV Stopped (03/26/18 1000)     LOS: 10 days     Kayleen Memos, MD [facility] Pager [phone]  If 7PM-7AM, please contact night-coverage [email or web address] Password [email or web address] 03/26/2018, 3:25 PM

## 2018-03-27 DIAGNOSIS — R531 Weakness: Secondary | ICD-10-CM

## 2018-03-27 DIAGNOSIS — Z9689 Presence of other specified functional implants: Secondary | ICD-10-CM

## 2018-03-27 DIAGNOSIS — B9561 Methicillin susceptible Staphylococcus aureus infection as the cause of diseases classified elsewhere: Secondary | ICD-10-CM

## 2018-03-27 DIAGNOSIS — R140 Abdominal distension (gaseous): Secondary | ICD-10-CM

## 2018-03-27 DIAGNOSIS — M545 Low back pain: Secondary | ICD-10-CM

## 2018-03-27 LAB — CBC
HCT: 36.4 % — ABNORMAL LOW (ref 39.0–52.0)
Hemoglobin: 11.3 g/dL — ABNORMAL LOW (ref 13.0–17.0)
MCH: 26.3 pg (ref 26.0–34.0)
MCHC: 31 g/dL (ref 30.0–36.0)
MCV: 84.8 fL (ref 78.0–100.0)
Platelets: 746 10*3/uL — ABNORMAL HIGH (ref 150–400)
RBC: 4.29 MIL/uL (ref 4.22–5.81)
RDW: 17.1 % — ABNORMAL HIGH (ref 11.5–15.5)
WBC: 10.1 10*3/uL (ref 4.0–10.5)

## 2018-03-27 LAB — COMPREHENSIVE METABOLIC PANEL
ALT: 34 U/L (ref 17–63)
AST: 47 U/L — ABNORMAL HIGH (ref 15–41)
Albumin: 2.1 g/dL — ABNORMAL LOW (ref 3.5–5.0)
Alkaline Phosphatase: 109 U/L (ref 38–126)
Anion gap: 10 (ref 5–15)
BUN: 14 mg/dL (ref 6–20)
CO2: 25 mmol/L (ref 22–32)
Calcium: 9 mg/dL (ref 8.9–10.3)
Chloride: 97 mmol/L — ABNORMAL LOW (ref 101–111)
Creatinine, Ser: 0.96 mg/dL (ref 0.61–1.24)
GFR calc Af Amer: >60 mL/min (ref >60)
GFR calc non Af Amer: >60 mL/min (ref >60)
Glucose, Bld: 95 mg/dL (ref 65–99)
Potassium: 4.8 mmol/L (ref 3.5–5.1)
Sodium: 132 mmol/L — ABNORMAL LOW (ref 135–145)
Total Bilirubin: 0.4 mg/dL (ref 0.3–1.2)
Total Protein: 7.8 g/dL (ref 6.5–8.1)

## 2018-03-27 NOTE — Progress Notes (Signed)
CSW continuing to follow for patient discharge needs.  Percell Locus Emryn Flanery LCSW [phone]

## 2018-03-27 NOTE — Anesthesia Preprocedure Evaluation (Addendum)
Anesthesia Evaluation  Patient identified by MRN, date of birth, ID band Patient awake    Reviewed: Allergy & Precautions, NPO status , Patient's Chart, lab work & pertinent test results  History of Anesthesia Complications Negative for: history of anesthetic complications  Airway Mallampati: II  TM Distance: >3 FB Neck ROM: Full   Comment: Reports ACDF, not noted in history. Marked decrease in ROM Dental no notable dental hx. (+) Dental Advisory Given   Pulmonary asthma , sleep apnea , pneumonia, former smoker,    Pulmonary exam normal        Cardiovascular Exercise Tolerance: Poor hypertension, + CAD  Normal cardiovascular exam  LV EF: 55% -   60% Neg Stress test   Neuro/Psych  Headaches, Anxiety Depression    GI/Hepatic Neg liver ROS, GERD  ,  Endo/Other  Morbid obesity  Renal/GU Renal disease     Musculoskeletal  (+) Arthritis ,   Abdominal   Peds  Hematology negative hematology ROS (+)   Anesthesia Other Findings   Reproductive/Obstetrics                            Anesthesia Physical  Anesthesia Plan  ASA: III  Anesthesia Plan: General   Post-op Pain Management:    Induction: Intravenous  PONV Risk Score and Plan: 3 and Ondansetron, Dexamethasone and Diphenhydramine  Airway Management Planned: Oral ETT  Additional Equipment: Arterial line  Intra-op Plan:   Post-operative Plan: Extubation in OR  Informed Consent: I have reviewed the patients History and Physical, chart, labs and discussed the procedure including the risks, benefits and alternatives for the proposed anesthesia with the patient or authorized representative who has indicated his/her understanding and acceptance.   Dental advisory given  Plan Discussed with: Anesthesiologist and CRNA  Anesthesia Plan Comments: (Multimodal Anesthetic)       Anesthesia Quick Evaluation

## 2018-03-27 NOTE — Progress Notes (Addendum)
Physical Therapy Treatment Patient Details Name: Cody Velez MRN: [id number] DOB: 1946/04/13 Today's Date: 03/27/2018    History of Present Illness Cody Velez is a 72 y.o. male with medical history significant for chronic back pain with recent spinal cord stimulator placement several weeks ago, nephrolithiasis with recent admission for UTI, hypertension, dyslipidemia, morbid obesity, and CAD who was brought to the emergency department by family members due to worsening generalized weakness that led to a fall at home.  He actually saw urology earlier today as a follow-up appointment after recent admission to [facility] in [place] where he was noted to have some bilateral nephrolithiasis.  He was apparently prescribed some doxycycline during his urology visit today, likely due to suspicion of urinary tract infection.  He has had progressive confusion throughout the day and was noted to be hypoxemic upon arrival as well as hypoxemic during his urology visit earlier.    PT Comments    Pt received in bed and agreeable to participation in therapy. Sidelying to sit performed with max assist. Attempted sit to stand with bariatric stedy. Pt unable to attain upright stance. He tolerated sitting EOB x 7 minutes. Agreeable to bed to recliner transfer using maximove. However, pain and fatigue began to increase with sitting and pt required return to bed. Maximove sling placed in room for nursing to be able to perform bed<>chair transfers with lift. After further consideration of this patient, he would be a good CIR candidate if we see improvement in his activity tolerance and pain. Will increase frequency to 3 x week with the objective of working toward this goal.     Follow Up Recommendations  SNF     Equipment Recommendations  None recommended by PT    Recommendations for Other Services       Precautions / Restrictions Precautions Precautions: Fall;Back    Mobility  Bed Mobility Overal bed  mobility: Needs Assistance Bed Mobility: Rolling;Sidelying to Sit;Sit to Sidelying Rolling: Max assist Sidelying to sit: Max assist     Sit to sidelying: Max assist General bed mobility comments: +rail, assist with BLE and to elevate trunk  Transfers Overall transfer level: Needs assistance     Sit to Stand: Total assist         General transfer comment: Attempted sit to stand with bariatric stedy. Pt only able to clear bottom from bed several inches with +2 max assist.   Ambulation/Gait             General Gait Details: unable   Stairs            Wheelchair Mobility    Modified Rankin (Stroke Patients Only)       Balance Overall balance assessment: Needs assistance Sitting-balance support: Feet supported;Bilateral upper extremity supported Sitting balance-Leahy Scale: Poor Sitting balance - Comments: LOB backward                                    Cognition Arousal/Alertness: Awake/alert Behavior During Therapy: WFL for tasks assessed/performed Overall Cognitive Status: Within Functional Limits for tasks assessed                                 General Comments: Wife and daughter present during session.      Exercises      General Comments        Pertinent  Vitals/Pain Pain Assessment: 0-10 Pain Score: 8  Pain Location: low back Pain Descriptors / Indicators: Sharp;Aching;Grimacing;Guarding Pain Intervention(s): Limited activity within patient's tolerance;Repositioned;Monitored during session;RN gave pain meds during session    Home Living                      Prior Function            PT Goals (current goals can now be found in the care plan section) Acute Rehab PT Goals Patient Stated Goal: return home PT Goal Formulation: With patient Time For Goal Achievement: 03/30/18 Potential to Achieve Goals: Good Progress towards PT goals: Not progressing toward goals - comment(decreased strength BLE,  increased pain)    Frequency    Min 3x week      PT Plan Current plan remains appropriate    Co-evaluation              AM-PAC PT "6 Clicks" Daily Activity  Outcome Measure  Difficulty turning over in bed (including adjusting bedclothes, sheets and blankets)?: Unable Difficulty moving from lying on back to sitting on the side of the bed? : Unable Difficulty sitting down on and standing up from a chair with arms (e.g., wheelchair, bedside commode, etc,.)?: Unable Help needed moving to and from a bed to chair (including a wheelchair)?: Total Help needed walking in hospital room?: Total Help needed climbing 3-5 steps with a railing? : Total 6 Click Score: 6    End of Session Equipment Utilized During Treatment: Gait belt Activity Tolerance: Patient limited by pain;Patient limited by fatigue Patient left: in bed;with call bell/phone within reach;with family/visitor present;with bed alarm set Nurse Communication: Mobility status;Need for lift equipment PT Visit Diagnosis: Unsteadiness on feet (R26.81);Muscle weakness (generalized) (M62.81);History of falling (Z91.81);Pain     Time: 5009-3818 PT Time Calculation (min) (ACUTE ONLY): 31 min  Charges:  $Therapeutic Activity: 23-37 mins                    G Codes:       Lorrin Goodell, PT  Office # [phone] Pager [phone]    Lorriane Shire 03/27/2018, 10:46 AM

## 2018-03-27 NOTE — Progress Notes (Signed)
Pharmacy Antibiotic Note  Cody Velez is a 72 y.o. male admitted on 03/15/2018 with sepsis.  Pharmacy has been monitoring Ancef dosing for MSSA bacteremia.  Plans for 6 weeks of IV antibiotics and the OPAT has been entered.  Creatinine trending up slightly 0.96 with a normalized CrCl ~ 65. BMI >35.  Plan: Continue Ancef 2gm IV every 8 hours. Monitor labs, micro and vitals.   Height: 6\' 3"  (190.5 cm) Weight: 287 lb 7.7 oz (130.4 kg) IBW/kg (Calculated) : 84.5  Temp (24hrs), Avg:99 F (37.2 C), Min:98.5 F (36.9 C), Max:99.4 F (37.4 C)  Recent Labs  Lab 03/21/18 0449 03/24/18 0423 03/24/18 1007 03/26/18 1645 03/27/18 0455  WBC 12.7*  --  11.6* 9.6 10.1  CREATININE 0.74 0.86  --  0.97 0.96    Estimated Creatinine Clearance: 102.7 mL/min (by C-G formula based on SCr of 0.96 mg/dL).    No Known Allergies  Antimicrobials this admission: Vancomycin 3/28 >> 3/30 Zosyn 3/28 >> 3/30 Ancef 3/30 >> (6 weeks)   Dose adjustments this admission: n/a   Microbiology results: 3/30 Repeat BCx: ngf 3/27 BCx: 1 or 2 = MSSA 3/27 UCx: insignificant growth   Thank you for allowing [place] to participate in this patients care.  Jens Som, PharmD Clinical phone for 03/27/2018 from 7a-3:30p: [phone] If after 3:30p, please call main pharmacy at: [phone] 03/27/2018 12:18 PM

## 2018-03-27 NOTE — Progress Notes (Signed)
[facility] for Infectious Disease  Date of Admission:  03/15/2018             ASSESSMENT/PLAN  Cody Velez is stable with continued treatment for MSSA bacteremia with the most likely source being his spine as his TEE was negative. Awaiting neurosurgery evaluation and possible removal of the spinal stimulator. Tolerating antibiotics with no adverse side effects. PICC line in place with plan for 6 weeks of IV therapy.  1. Continue Ancef for MSSA bacteremia 2. Pending neurosurgery evaluation.    Principal Problem:   Bacteremia due to methicillin susceptible Staphylococcus aureus (MSSA) Active Problems:   HYPERCHOLESTEROLEMIA   Essential hypertension, benign   CAD, NATIVE VESSEL   GERD   Chronic back pain   History of cerebrovascular disease   Weakness   Fever   Sepsis ([place])   Nephrolithiasis   AKI (acute kidney injury) ([place])   Acute metabolic encephalopathy   Cognitive impairment   Chronic pain syndrome   Obesity   Staphylococcus aureus sepsis (HCC)   Encephalopathy   Weakness of both lower extremities   Discitis of thoracic region   Spinal cord stimulator status   . amLODipine  10 mg Oral Daily  . aspirin EC  81 mg Oral Daily  . chlorhexidine  15 mL Mouth Rinse BID  . cycloSPORINE  1 drop Both Eyes BID  . donepezil  10 mg Oral QHS  . enoxaparin (LOVENOX) injection  60 mg Subcutaneous Q24H  . feeding supplement (ENSURE ENLIVE)  237 mL Oral BID BM  . finasteride  5 mg Oral Daily  . gabapentin  600 mg Oral TID  . ipratropium-albuterol  3 mL Nebulization BID  . latanoprost  1 drop Both Eyes QHS  . mouth rinse  15 mL Mouth Rinse q12n4p  . memantine  10 mg Oral BID  . pantoprazole  40 mg Oral Daily  . polyethylene glycol  17 g Oral Daily  . potassium chloride  20 mEq Oral BID  . pravastatin  10 mg Oral QPM  . saccharomyces boulardii  250 mg Oral BID WC  . senna-docusate  1 tablet Oral BID  . sodium chloride flush  10-40 mL Intracatheter Q12H  . traZODone   100 mg Oral QHS  . venlafaxine XR  150 mg Oral BID  . vitamin B-12  1,000 mcg Oral Daily    SUBJECTIVE:  Afebrile overnight with no leukocytosis. Continues to receive Ancef for MSSA bacteremia with potential spinal source. No adverse side effects. Awaiting neurosurgery evaluation. Back pain remains present in his lower back. Able to sit at the side of the bed but not able to stand on his own. Moving bowels and bladder well. Continues to have weakness of the bilateral lower extremities.   No Known Allergies   Review of Systems: Review of Systems  Constitutional: Negative for chills, diaphoresis, fever and malaise/fatigue.  Respiratory: Negative for cough, shortness of breath and wheezing.   Cardiovascular: Negative for chest pain.  Gastrointestinal: Negative for abdominal pain, constipation, diarrhea, nausea and vomiting.  Genitourinary: Negative for dysuria, flank pain, frequency and urgency.  Musculoskeletal: Positive for back pain.      OBJECTIVE: Vitals:   03/26/18 2131 03/26/18 2227 03/27/18 0515 03/27/18 0759  BP:  132/74 128/83   Pulse:  80 84   Resp:  16 18   Temp:  99.4 F (37.4 C) 98.5 F (36.9 C)   TempSrc:  Axillary    SpO2: 93% 97% 94% 94%  Weight:  287 lb 7.7 oz (130.4 kg)   Height:       Body mass index is 35.93 kg/m.  Physical Exam  Constitutional: He is oriented to person, place, and time and well-developed, well-nourished, and in no distress. No distress.  Cardiovascular: Normal rate, regular rhythm, normal heart sounds and intact distal pulses. Exam reveals no gallop and no friction rub.  No murmur heard. Pulmonary/Chest: Effort normal. No respiratory distress. He has no wheezes. He has no rales. He exhibits no tenderness.  Abdominal: Soft. Bowel sounds are normal. There is no tenderness. There is no rebound.  Musculoskeletal:  Low back pain lower thoracic and upper lumbar. Distal pulses and sensation are intact and appropriate. There is weakness  (L>R) of the bilateral lower extremities with muscle strength decreased.   Neurological: He is alert and oriented to person, place, and time.  Skin: Skin is warm and dry. No rash noted.  Psychiatric: Affect and judgment normal.    Lab Results Lab Results  Component Value Date   WBC 10.1 03/27/2018   HGB 11.3 (L) 03/27/2018   HCT 36.4 (L) 03/27/2018   MCV 84.8 03/27/2018   PLT 746 (H) 03/27/2018    Lab Results  Component Value Date   CREATININE 0.96 03/27/2018   BUN 14 03/27/2018   NA 132 (L) 03/27/2018   K 4.8 03/27/2018   CL 97 (L) 03/27/2018   CO2 25 03/27/2018    Lab Results  Component Value Date   ALT 34 03/27/2018   AST 47 (H) 03/27/2018   ALKPHOS 109 03/27/2018   BILITOT 0.4 03/27/2018     Microbiology: Recent Results (from the past 240 hour(s))  Culture, blood (routine x 2)     Status: None   Collection Time: 03/18/18 12:38 PM  Result Value Ref Range Status   Specimen Description LEFT ANTECUBITAL  Final   Special Requests   Final    BOTTLES DRAWN AEROBIC AND ANAEROBIC Blood Culture adequate volume   Culture   Final    NO GROWTH 5 DAYS Performed at [facility], [address], [place], [place] [address]    Report Status 03/23/2018 FINAL  Final  Culture, blood (routine x 2)     Status: None   Collection Time: 03/18/18 12:45 PM  Result Value Ref Range Status   Specimen Description BLOOD LEFT HAND  Final   Special Requests   Final    BOTTLES DRAWN AEROBIC AND ANAEROBIC Blood Culture results may not be optimal due to an inadequate volume of blood received in culture bottles   Culture   Final    NO GROWTH 5 DAYS Performed at [facility], [address], [place], [place] [address]    Report Status 03/23/2018 FINAL  Final  C difficile quick scan w PCR reflex     Status: None   Collection Time: 03/19/18  5:04 PM  Result Value Ref Range Status   C Diff antigen NEGATIVE NEGATIVE Final   C Diff toxin NEGATIVE NEGATIVE Final   C Diff interpretation No C.  difficile detected.  Final    Comment: VALID Performed at [facility], [address], [place],  [address]   Gastrointestinal Panel by PCR , Stool     Status: None   Collection Time: 03/19/18  5:04 PM  Result Value Ref Range Status   Campylobacter species NOT DETECTED NOT DETECTED Final   Plesimonas shigelloides NOT DETECTED NOT DETECTED Final   Salmonella species NOT DETECTED NOT DETECTED Final   Yersinia  enterocolitica NOT DETECTED NOT DETECTED Final   Vibrio species NOT DETECTED NOT DETECTED Final   Vibrio cholerae NOT DETECTED NOT DETECTED Final   Enteroaggregative E coli (EAEC) NOT DETECTED NOT DETECTED Final   Enteropathogenic E coli (EPEC) NOT DETECTED NOT DETECTED Final   Enterotoxigenic E coli (ETEC) NOT DETECTED NOT DETECTED Final   Shiga like toxin producing E coli (STEC) NOT DETECTED NOT DETECTED Final   Shigella/Enteroinvasive E coli (EIEC) NOT DETECTED NOT DETECTED Final   Cryptosporidium NOT DETECTED NOT DETECTED Final   Cyclospora cayetanensis NOT DETECTED NOT DETECTED Final   Entamoeba histolytica NOT DETECTED NOT DETECTED Final   Giardia lamblia NOT DETECTED NOT DETECTED Final   Adenovirus F40/41 NOT DETECTED NOT DETECTED Final   Astrovirus NOT DETECTED NOT DETECTED Final   Norovirus GI/GII NOT DETECTED NOT DETECTED Final   Rotavirus A NOT DETECTED NOT DETECTED Final   Sapovirus (I, II, IV, and V) NOT DETECTED NOT DETECTED Final    Comment: Performed at [facility], [address], [place], [place] [address]     Greg Denaly Gatling, NP [facility] Infectious Disease [facility] [phone] Pager  03/27/2018  9:23 AM

## 2018-03-27 NOTE — Consult Note (Signed)
Reason for Consult: Weakness of lower extremities back pain Referring Physician: Dr. Andee Poles is an 72 y.o. male.  HPI: [name] is a 72 year old right-handed individual whom I have known for a number of years.  He has had a number of lumbar surgeries over the years including initially decompression at L4-5 and L5-S1 with a ray cage arthrodesis.  Subsequently developed adjacent level disease that required a number of surgeries to include a decompression fusion from L1 down to the sacrum.  He is also had cervical spondylosis and has had multilevel anterior decompression and arthrodesis.  He however has had intractable neck pain and headaches and in January of this year underwent placement of a cervical spinal cord stimulator.  The patient and his lady friend tell me that a little over 2 weeks ago they were at the [facility] for a reunion and the patient notes that he was not feeling well he left early and on the 27th well to returning through [place] the patient had a fall while making a purchase at a [employer].  He was ultimately seen in the local hospital there and he had workup to rule out the potential for cardiac event.  He subsequently was seen by urology because he was having significant back pain in the thoracolumbar spine and it was felt that he had a urinary tract infection a CT scan of the abdomen was performed this was on 27 March.  He subsequently had a second fall and notes that his legs became severely weak this was round about April 1 and he was seen at any pain hospital and underwent a number of CT scans.  These included the cervical thoracic and lumbar spines.  On review of the studies does appear that there is some edematous changes around the vertebrae of T10 and T11.  He was found to have some sepsis with an methicillin sensitive staph aureus and he has been on IV antibiotics nonetheless he is not been able to walk at all since the first and was barely able to  walk from 27 March.  Rotation was initially sought to consider removal of the spinal cord stimulator so that the patient could obtain a proper MRIThough his spinal cord stimulator is apparently MRI compatible it is marginally compatible that is he can only have a limited amount of scanning done with limited taking signal strength and time in the machine.  Past Medical History:  Diagnosis Date  . Allergic rhinitis   . Arthritis   . Asthma    as a child  . B12 deficiency   . Cancer [facility])    prostate  . Cervicogenic headache 01/28/2016  . Chronic back pain   . Coronary atherosclerosis of native coronary artery    Mild nonobstructive CAD at catheterization January 2015  . Depression   . Essential hypertension, benign   . Falls   . GERD (gastroesophageal reflux disease)   . History of cerebrovascular disease 07/23/2015  . History of pneumonia 02/2011  . Hyperlipidemia   . Kidney stone   . Memory difficulty 07/23/2015  . OSA (obstructive sleep apnea)    CPAP - Dr. [name]  . Prostate cancer ([place])   . PTSD (post-traumatic stress disorder)    [place]  . Rectal bleeding     Past Surgical History:  Procedure Laterality Date  . BACK SURGERY  02/14/12   lumbar OR #7; "today redid L1L2; replaced screws; added bone from hip"  . BILATERAL KNEE  ARTHROSCOPY    . COLONOSCOPY  10/15/2008   Dr. [name]: tubular adenoma   . COLONOSCOPY  12/17/2003   EUM:PNTIRW rectal and colon  . COLONOSCOPY N/A 09/05/2014   Procedure: COLONOSCOPY;  Surgeon: Daneil Dolin, MD;  Location: [facility] END[facility];  Service: Endoscopy;  Laterality: N/A;  7:30-rescheduled 9/17 to [place] notified pt  . CYSTOSCOPY WITH STENT PLACEMENT Right 01/27/2016   Procedure: CYSTOSCOPY WITH STENT PLACEMENT;  Surgeon: [name], MD;  Location: [facility] ORS;  Service: Urology;  Laterality: Right;  . CYSTOSCOPY/RETROGRADE/URETEROSCOPY/STONE EXTRACTION WITH BASKET Right 01/27/2016   Procedure: CYSTOSCOPY, RIGHT RETROGRADE, RIGHT URETEROSCOPY,  STONE EXTRACTION ;  Surgeon: [name], MD;  Location: [facility] ORS;  Service: Urology;  Laterality: Right;  . ESOPHAGOGASTRODUODENOSCOPY  10/15/2008     Dr Rourk:Schatzki's ring status post dilation and disruption via 48 F Maloney dilator/ otherwise unremarkable esophagus, small hiatal hernia, multiple fundal gland polyps not manipulated, gastritis, negative H.pylori  . ESOPHAGOGASTRODUODENOSCOPY  06/21/02   ERX:VQMGQ sliding hiatal hernia with mild changes of reflux esophagitis limited to gastroesophageal junction.  Noncritical ring at distal esophagus, 3 cm proximal to gastroesophageal junction/Antral gastritis  . [place]   "broke face playing softball"  . FRACTURE SURGERY     "left wrist; broke it; took spur off"  . HOLMIUM LASER APPLICATION Right 05/26/6194   Procedure: HOLMIUM LASER APPLICATION;  Surgeon: [name], MD;  Location: [facility] ORS;  Service: Urology;  Laterality: Right;  . LEFT HEART CATHETERIZATION WITH CORONARY ANGIOGRAM N/A 12/27/2013   Procedure: LEFT HEART CATHETERIZATION WITH CORONARY ANGIOGRAM;  Surgeon: [name], MD;  Location: [facility] CATH LAB;  Service: Cardiovascular;  Laterality: N/A;  . neck epidural    . SHOULDER SURGERY Bilateral   . TEE WITHOUT CARDIOVERSION N/A 03/21/2018   Procedure: TRANSESOPHAGEAL ECHOCARDIOGRAM (TEE) WITH PROPOFOL;  Surgeon: [name], MD;  Location: [facility];  Service: Cardiovascular;  Laterality: N/A;    Family History  Problem Relation Age of Onset  . Emphysema Father   . Heart failure Father   . Lung cancer Father   . CAD Father   . Colon cancer Mother   . Stroke Mother   . Breast cancer Mother   . Stroke Sister   . Heart attack Brother   . Dementia Paternal Uncle   . Emphysema Maternal Grandmother   . Stroke Maternal Grandmother   . Asthma Other        grandson  . Heart disease Paternal Grandfather   . Anesthesia problems Neg Hx   . Hypotension Neg Hx   . Malignant hyperthermia Neg  Hx   . Pseudochol deficiency Neg Hx     Social History:  reports that he quit smoking about 59 years ago. His smoking use included cigarettes. His smokeless tobacco use includes chew. He reports that he drinks alcohol. He reports that he does not use drugs.  Allergies: No Known Allergies  Medications: I have reviewed the patient's current medications.  Results for orders placed or performed during the hospital encounter of 03/15/18 (from the past 48 hour(s))  CBC     Status: Abnormal   Collection Time: 03/26/18  4:45 PM  Result Value Ref Range   WBC 9.6 4.0 - 10.5 K/uL   RBC 4.15 (L) 4.22 - 5.81 MIL/uL   Hemoglobin 11.2 (L) 13.0 - 17.0 g/dL   HCT 35.2 (L) 39.0 - 52.0 %   MCV 84.8 78.0 - 100.0 fL   MCH 27.0  26.0 - 34.0 pg   MCHC 31.8 30.0 - 36.0 g/dL   RDW 17.1 (H) 11.5 - 15.5 %   Platelets 732 (H) 150 - 400 K/uL    Comment: Performed at [facility], [place] [address] [place], [place] [address]  Comprehensive metabolic panel     Status: Abnormal   Collection Time: 03/26/18  4:45 PM  Result Value Ref Range   Sodium 133 (L) 135 - 145 mmol/L   Potassium 4.7 3.5 - 5.1 mmol/L   Chloride 99 (L) 101 - 111 mmol/L   CO2 26 22 - 32 mmol/L   Glucose, Bld 105 (H) 65 - 99 mg/dL   BUN 15 6 - 20 mg/dL   Creatinine, Ser 0.97 0.61 - 1.24 mg/dL   Calcium 8.9 8.9 - 10.3 mg/dL   Total Protein 7.8 6.5 - 8.1 g/dL   Albumin 2.0 (L) 3.5 - 5.0 g/dL   AST 53 (H) 15 - 41 U/L   ALT 36 17 - 63 U/L   Alkaline Phosphatase 105 38 - 126 U/L   Total Bilirubin 0.3 0.3 - 1.2 mg/dL   GFR calc non Af Amer >60 >60 mL/min   GFR calc Af Amer >60 >60 mL/min    Comment: (NOTE) The eGFR has been calculated using the CKD EPI equation. This calculation has not been validated in all clinical situations. eGFR's persistently <60 mL/min signify possible Chronic Kidney Disease.    Anion gap 8 5 - 15    Comment: Performed at [facility] [address] [place], [place] [address]  CBC     Status: Abnormal    Collection Time: 03/27/18  4:55 AM  Result Value Ref Range   WBC 10.1 4.0 - 10.5 K/uL   RBC 4.29 4.22 - 5.81 MIL/uL   Hemoglobin 11.3 (L) 13.0 - 17.0 g/dL   HCT 36.4 (L) 39.0 - 52.0 %   MCV 84.8 78.0 - 100.0 fL   MCH 26.3 26.0 - 34.0 pg   MCHC 31.0 30.0 - 36.0 g/dL   RDW 17.1 (H) 11.5 - 15.5 %   Platelets 746 (H) 150 - 400 K/uL    Comment: Performed at [facility], [place] [address] [place],  [address]  Comprehensive metabolic panel     Status: Abnormal   Collection Time: 03/27/18  4:55 AM  Result Value Ref Range   Sodium 132 (L) 135 - 145 mmol/L   Potassium 4.8 3.5 - 5.1 mmol/L   Chloride 97 (L) 101 - 111 mmol/L   CO2 25 22 - 32 mmol/L   Glucose, Bld 95 65 - 99 mg/dL   BUN 14 6 - 20 mg/dL   Creatinine, Ser 0.96 0.61 - 1.24 mg/dL   Calcium 9.0 8.9 - 10.3 mg/dL   Total Protein 7.8 6.5 - 8.1 g/dL   Albumin 2.1 (L) 3.5 - 5.0 g/dL   AST 47 (H) 15 - 41 U/L   ALT 34 17 - 63 U/L   Alkaline Phosphatase 109 38 - 126 U/L   Total Bilirubin 0.4 0.3 - 1.2 mg/dL   GFR calc non Af Amer >60 >60 mL/min   GFR calc Af Amer >60 >60 mL/min    Comment: (NOTE) The eGFR has been calculated using the CKD EPI equation. This calculation has not been validated in all clinical situations. eGFR's persistently <60 mL/min signify possible Chronic Kidney Disease.    Anion gap 10 5 - 15    Comment: Performed at [facility], [place] [address], [place], [place] [address]    No results found.  Review of Systems  Constitutional: Positive for fever and weight loss.  HENT: Negative.   Eyes: Negative.   Respiratory: Negative.   Cardiovascular: Negative.   Gastrointestinal: Negative.   Genitourinary: Positive for dysuria.  Musculoskeletal: Positive for back pain and falls.  Skin: Negative.   Neurological: Positive for sensory change and weakness.  Endo/Heme/Allergies: Negative.   Psychiatric/Behavioral: Negative.    Blood pressure 128/83, pulse 84, temperature 98.5 F (36.9 C),  resp. rate 18, height '6\' 3"'  (1.905 m), weight 130.4 kg (287 lb 7.7 oz), SpO2 94 %. Physical Exam  Constitutional: He is oriented to person, place, and time. He appears well-developed and well-nourished.  HENT:  Head: Normocephalic and atraumatic.  Eyes: Pupils are equal, round, and reactive to light. Conjunctivae and EOM are normal.  Neck: Normal range of motion. Neck supple.  GI: Soft. Bowel sounds are normal.  Neurological: He is alert and oriented to person, place, and time.  [place] weakness in the lower extremities, the left with trace movement in the dorsi and plantar flexors are graded at 1 out of 5 trace movement proximally in the knee flexors but no ability to extend the leg no antigravity strength.  On the right lower extremity the patient has antigravity strength in the iliopsoas quad the gastroc and the tibialis anterior groups but best graded no more than 3 out of 5.  His upper extremity strength appears to be intact upper extremity reflexes are trace in the biceps bilaterally absent in the triceps bilaterally patient has absent reflexes in the patella and the Achilles both in the lower extremities.  Sensory examination reveals that there is fairly intact sensation on the right side of his body down to the lower extremities where there is some diminishment below the level of the knee a discrete level cannot be detected however on the left side the patient has a sensory level that is fairly acute between the levels of T10 and T12 with a significant change on the left side of the abdomen.  Rectal exam was not performed.  Skin: Skin is warm.  Psychiatric: He has a normal mood and affect. His behavior is normal. Judgment and thought content normal.    Assessment/Plan: Severe paraparesis at the thoracolumbar junction likely due to osteomyelitis at T10-T11.  There are likely exists an epidural abscess at this level.  He will require surgical decompression of this lesion and stabilization  will need to be performed from approximately the level of T8-L2.  Because of the size of his pedicles this will be assisted with the use of a surgical robot.  Surgery will be planned for first thing in the morning to yield a decompression and stabilization.  I do not believe that the spinal cord stimulator is likely involved in this process at all.  If we can obtain a limited MRI of the lumbar spine this evening we will attempt to do so.  I have discussed the situation with the patient his significant other and the patient's daughter [name].  Blair Mesina J 03/27/2018, 3:07 PM

## 2018-03-27 NOTE — Progress Notes (Signed)
PROGRESS NOTE  NEWELL WAFER KYH:[id number] DOB: 11/04/1946 DOA: 03/15/2018 PCP: Asencion Noble, MD  HPI/Recap of past 24 hours: Cody Velez is a 72 y.o. male with medical history significant for chronic back pain with recent spinal cord stimulator placement several weeks ago, nephrolithiasis with recent admission for UTI, hypertension, dyslipidemia, morbid obesity, and CAD who was brought to the emergency department by family members due to worsening generalized weakness that led to a fall at home.  He actually saw urology earlier today as a follow-up appointment after recent admission to [facility] in [place] where he was noted to have some bilateral nephrolithiasis.  He was apparently prescribed some doxycycline during his urology visit today, likely due to suspicion of urinary tract infection.  He has had progressive confusion throughout the day and was noted to be hypoxemic upon arrival as well as hypoxemic during his urology visit earlier. He denies any dyspnea or chest pain.  No lower extremity edema, orthopnea, or dyspnea on exertion.  He reportedly had a stress test with no concerning findings during this recent hospitalization. He complains of ongoing low back pain that is chronic for him.  03/23/2018: Patient seen and examined at his bedside.  He reports persistent lower back pain and weakness in his lower extremities.  Denies chills or night sweats.  Has no new complaints.  03/24/2018: Patient seen and examined with his friend at his bedside.  Reports pain in his lower back which is improved after he takes pain medications.  Started on bowel regimen.  03/25/18: No new complaints. Report his back pain at its worse is 10/10 after pain med 5/10.  03/26/18 severe dull lower back pain reported this am. IV dilaudid provided prn. Denies nausea or abd pian.  03/27/18: persistent lower back pain and lower extremity weakness. Spoke with neurosurgery Dr Ellene Route who will take the patient to surgery tomorrow.  NPO after midnight.  Assessment/Plan: Principal Problem:   Bacteremia due to methicillin susceptible Staphylococcus aureus (MSSA) Active Problems:   HYPERCHOLESTEROLEMIA   Essential hypertension, benign   CAD, NATIVE VESSEL   GERD   Chronic back pain   History of cerebrovascular disease   Weakness   Fever   Sepsis ([place])   Nephrolithiasis   AKI (acute kidney injury) ([place])   Acute metabolic encephalopathy   Cognitive impairment   Chronic pain syndrome   Obesity   Staphylococcus aureus sepsis ([facility])   Encephalopathy   Weakness of both lower extremities   Discitis of thoracic region   Spinal cord stimulator status   Abdominal distension  Sepsis secondary to MSSA bacteremia/suspected T10-T11 osteomyelitis On admission culture positive 1 out of 2 bottles for MSSA TEE unremarkable ID following Receiving treatment for potential infection in his lower thoracic spine with questionable involvement of the cervical spine. Continue IV cefazolin day #5 PICC line for prolonged IV antibiotics Plan for IV cefazolin times 6 weeks Possible stimulator removal by neurosurgery Afebrile with no leukocytosis Tmax 99.2 (03/27/18) Continue to monitor fever curve Continue to monitor CBC  Intractable lower back pain with lower extremity weakness 2/2 to osteomyelitis at T10-T11 Added IV dilaudid 03/26/18 as needed Bowel regimen in place to avoid opioids induced constipation Neurosurgery suspects an epidural abscess Planned for surgery in the morning NPO after midnight/stop lovenox   Generalized weakness/physical debility Defer to neurosurgery to the extend of PT Increase po intake, supplement  Hyponatremia, euvolemic Na+ 133 Repeat labs am  Hypokalemia, resolved Potassium 4.3 from 3.4 post repletion Repeat BMP in the  morning  Chronic back pain status post spinal stimulator placement Neurosurgery following. Highly appreciated. Continue pain management  Obesity BMI 35 Weight loss  outpatient  Depression/anxiety Continue Effexor, trazodone  GERD Continue Protonix.  Constipation Most likely exacerbated by opiates Bowel regimen in place with Senokot and Dulcolax    Code Status: Full  Family Communication: Friend at bedside  Disposition Plan: Home when clinically stable   Consultants:  Infectious disease  neurosurgery  Procedures:  None  Antimicrobials:  IV cefazolin  DVT prophylaxis: SCDs, sq lovenox daily  Objective: Vitals:   03/26/18 2227 03/27/18 0515 03/27/18 0759 03/27/18 1523  BP: 132/74 128/83  133/84  Pulse: 80 84  87  Resp: 16 18  18   Temp: 99.4 F (37.4 C) 98.5 F (36.9 C)  99.2 F (37.3 C)  TempSrc: Axillary   Oral  SpO2: 97% 94% 94% 93%  Weight:  130.4 kg (287 lb 7.7 oz)    Height:        Intake/Output Summary (Last 24 hours) at 03/27/2018 1605 Last data filed at 03/27/2018 1506 Gross per 24 hour  Intake 100 ml  Output 3570 ml  Net -3470 ml   Filed Weights   03/25/18 0336 03/26/18 0531 03/27/18 0515  Weight: 131.9 kg (290 lb 12.6 oz) 131.7 kg (290 lb 5.5 oz) 130.4 kg (287 lb 7.7 oz)     Exam: 03/27/18  General: 72 yo CM WD WN NAD A&O x 3  Cardiovascular: RRR no rubs or gallops. No thyromegaly or JVD.  Respiratory: Clear to auscultation with no wheezes or rales.  Abdomen:  Obese nontender nondistended normal bowel sounds x4 quadrant.  Musculoskeletal: Good strength in upper extremities bilaterally  Skin: no ulcerative lesions.  Psychiatry: Mood is appropriate condition and setting.   Data Reviewed: CBC: Recent Labs  Lab 03/21/18 0449 03/24/18 1007 03/26/18 1645 03/27/18 0455  WBC 12.7* 11.6* 9.6 10.1  HGB 11.4* 10.7* 11.2* 11.3*  HCT 37.0* 34.2* 35.2* 36.4*  MCV 83.9 84.2 84.8 84.8  PLT 547* 645* 732* 951*   Basic Metabolic Panel: Recent Labs  Lab 03/21/18 0449 03/24/18 0423 03/26/18 1645 03/27/18 0455  NA 136 133* 133* 132*  K 3.4* 4.3 4.7 4.8  CL 95* 99* 99* 97*  CO2 31 26 26 25     GLUCOSE 96 87 105* 95  BUN 13 11 15 14   CREATININE 0.74 0.86 0.97 0.96  CALCIUM 8.2* 8.5* 8.9 9.0   GFR: Estimated Creatinine Clearance: 102.7 mL/min (by C-G formula based on SCr of 0.96 mg/dL). Liver Function Tests: Recent Labs  Lab 03/26/18 1645 03/27/18 0455  AST 53* 47*  ALT 36 34  ALKPHOS 105 109  BILITOT 0.3 0.4  PROT 7.8 7.8  ALBUMIN 2.0* 2.1*   No results for input(s): LIPASE, AMYLASE in the last 168 hours. Recent Labs  Lab 03/22/18 1201  AMMONIA 28   Coagulation Profile: No results for input(s): INR, PROTIME in the last 168 hours. Cardiac Enzymes: No results for input(s): CKTOTAL, CKMB, CKMBINDEX, TROPONINI in the last 168 hours. BNP (last 3 results) No results for input(s): PROBNP in the last 8760 hours. HbA1C: No results for input(s): HGBA1C in the last 72 hours. CBG: No results for input(s): GLUCAP in the last 168 hours. Lipid Profile: No results for input(s): CHOL, HDL, LDLCALC, TRIG, CHOLHDL, LDLDIRECT in the last 72 hours. Thyroid Function Tests: No results for input(s): TSH, T4TOTAL, FREET4, T3FREE, THYROIDAB in the last 72 hours. Anemia Panel: No results for input(s): VITAMINB12, FOLATE, FERRITIN, TIBC,  IRON, RETICCTPCT in the last 72 hours. Urine analysis:    Component Value Date/Time   COLORURINE AMBER (A) 03/15/2018 1830   APPEARANCEUR CLOUDY (A) 03/15/2018 1830   LABSPEC 1.029 03/15/2018 1830   PHURINE 5.0 03/15/2018 1830   GLUCOSEU 50 (A) 03/15/2018 1830   HGBUR MODERATE (A) 03/15/2018 1830   BILIRUBINUR MODERATE (A) 03/15/2018 1830   KETONESUR NEGATIVE 03/15/2018 1830   PROTEINUR 100 (A) 03/15/2018 1830   UROBILINOGEN 0.2 03/05/2011 1909   NITRITE NEGATIVE 03/15/2018 1830   LEUKOCYTESUR NEGATIVE 03/15/2018 1830   Sepsis Labs: @LABRCNTIP (procalcitonin:4,lacticidven:4)  ) Recent Results (from the past 240 hour(s))  Culture, blood (routine x 2)     Status: None   Collection Time: 03/18/18 12:38 PM  Result Value Ref Range Status    Specimen Description LEFT ANTECUBITAL  Final   Special Requests   Final    BOTTLES DRAWN AEROBIC AND ANAEROBIC Blood Culture adequate volume   Culture   Final    NO GROWTH 5 DAYS Performed at [facility], [address] [place], [place] [address]    Report Status 03/23/2018 FINAL  Final  Culture, blood (routine x 2)     Status: None   Collection Time: 03/18/18 12:45 PM  Result Value Ref Range Status   Specimen Description BLOOD LEFT HAND  Final   Special Requests   Final    BOTTLES DRAWN AEROBIC AND ANAEROBIC Blood Culture results may not be optimal due to an inadequate volume of blood received in culture bottles   Culture   Final    NO GROWTH 5 DAYS Performed at [facility], [address], [place], [place] [address]    Report Status 03/23/2018 FINAL  Final  C difficile quick scan w PCR reflex     Status: None   Collection Time: 03/19/18  5:04 PM  Result Value Ref Range Status   C Diff antigen NEGATIVE NEGATIVE Final   C Diff toxin NEGATIVE NEGATIVE Final   C Diff interpretation No C. difficile detected.  Final    Comment: VALID Performed at [facility], [address], [place],  [address]   Gastrointestinal Panel by PCR , Stool     Status: None   Collection Time: 03/19/18  5:04 PM  Result Value Ref Range Status   Campylobacter species NOT DETECTED NOT DETECTED Final   Plesimonas shigelloides NOT DETECTED NOT DETECTED Final   Salmonella species NOT DETECTED NOT DETECTED Final   Yersinia enterocolitica NOT DETECTED NOT DETECTED Final   Vibrio species NOT DETECTED NOT DETECTED Final   Vibrio cholerae NOT DETECTED NOT DETECTED Final   Enteroaggregative E coli (EAEC) NOT DETECTED NOT DETECTED Final   Enteropathogenic E coli (EPEC) NOT DETECTED NOT DETECTED Final   Enterotoxigenic E coli (ETEC) NOT DETECTED NOT DETECTED Final   Shiga like toxin producing E coli (STEC) NOT DETECTED NOT DETECTED Final   Shigella/Enteroinvasive E coli (EIEC) NOT DETECTED NOT DETECTED Final     Cryptosporidium NOT DETECTED NOT DETECTED Final   Cyclospora cayetanensis NOT DETECTED NOT DETECTED Final   Entamoeba histolytica NOT DETECTED NOT DETECTED Final   Giardia lamblia NOT DETECTED NOT DETECTED Final   Adenovirus F40/41 NOT DETECTED NOT DETECTED Final   Astrovirus NOT DETECTED NOT DETECTED Final   Norovirus GI/GII NOT DETECTED NOT DETECTED Final   Rotavirus A NOT DETECTED NOT DETECTED Final   Sapovirus (I, II, IV, and V) NOT DETECTED NOT DETECTED Final    Comment: Performed at [facility], [place]  [address] [place], [place] [address]      Studies: No results found.  Scheduled Meds: . amLODipine  10 mg Oral Daily  . aspirin EC  81 mg Oral Daily  . chlorhexidine  15 mL Mouth Rinse BID  . cycloSPORINE  1 drop Both Eyes BID  . donepezil  10 mg Oral QHS  . feeding supplement (ENSURE ENLIVE)  237 mL Oral BID BM  . finasteride  5 mg Oral Daily  . gabapentin  600 mg Oral TID  . ipratropium-albuterol  3 mL Nebulization BID  . latanoprost  1 drop Both Eyes QHS  . mouth rinse  15 mL Mouth Rinse q12n4p  . memantine  10 mg Oral BID  . pantoprazole  40 mg Oral Daily  . polyethylene glycol  17 g Oral Daily  . potassium chloride  20 mEq Oral BID  . pravastatin  10 mg Oral QPM  . saccharomyces boulardii  250 mg Oral BID WC  . senna-docusate  1 tablet Oral BID  . sodium chloride flush  10-40 mL Intracatheter Q12H  . traZODone  100 mg Oral QHS  . venlafaxine XR  150 mg Oral BID  . vitamin B-12  1,000 mcg Oral Daily    Continuous Infusions: .  ceFAZolin (ANCEF) IV Stopped (03/27/18 0926)     LOS: 11 days     Kayleen Memos, MD [facility] Pager [phone]  If 7PM-7AM, please contact night-coverage [email or web address] Password [email or web address] 03/27/2018, 4:05 PM

## 2018-03-28 ENCOUNTER — Inpatient Hospital Stay (HOSPITAL_COMMUNITY): Payer: Medicare Other | Admitting: Critical Care Medicine

## 2018-03-28 ENCOUNTER — Inpatient Hospital Stay (HOSPITAL_COMMUNITY): Payer: Medicare Other

## 2018-03-28 ENCOUNTER — Encounter (HOSPITAL_COMMUNITY): Payer: Self-pay | Admitting: Certified Registered Nurse Anesthetist

## 2018-03-28 ENCOUNTER — Inpatient Hospital Stay (HOSPITAL_COMMUNITY)
Admission: EM | Disposition: A | Discharge status: Rehabilitation Facility | Payer: Self-pay | Source: Home / Self Care | Attending: Internal Medicine

## 2018-03-28 DIAGNOSIS — I2510 Atherosclerotic heart disease of native coronary artery without angina pectoris: Secondary | ICD-10-CM

## 2018-03-28 HISTORY — PX: APPLICATION OF ROBOTIC ASSISTANCE FOR SPINAL PROCEDURE: SHX6753

## 2018-03-28 HISTORY — PX: POSTERIOR LUMBAR FUSION 4 LEVEL: SHX6037

## 2018-03-28 LAB — CBC
HCT: 31.2 % — ABNORMAL LOW (ref 39.0–52.0)
Hemoglobin: 9.7 g/dL — ABNORMAL LOW (ref 13.0–17.0)
MCH: 26.6 pg (ref 26.0–34.0)
MCHC: 31.1 g/dL (ref 30.0–36.0)
MCV: 85.7 fL (ref 78.0–100.0)
Platelets: 722 10*3/uL — ABNORMAL HIGH (ref 150–400)
RBC: 3.64 MIL/uL — ABNORMAL LOW (ref 4.22–5.81)
RDW: 17.1 % — ABNORMAL HIGH (ref 11.5–15.5)
WBC: 15.9 10*3/uL — ABNORMAL HIGH (ref 4.0–10.5)

## 2018-03-28 LAB — COMPREHENSIVE METABOLIC PANEL
ALT: 20 U/L (ref 17–63)
AST: 44 U/L — ABNORMAL HIGH (ref 15–41)
Albumin: 2.6 g/dL — ABNORMAL LOW (ref 3.5–5.0)
Alkaline Phosphatase: 87 U/L (ref 38–126)
Anion gap: 9 (ref 5–15)
BUN: 19 mg/dL (ref 6–20)
CO2: 24 mmol/L (ref 22–32)
Calcium: 8.9 mg/dL (ref 8.9–10.3)
Chloride: 100 mmol/L — ABNORMAL LOW (ref 101–111)
Creatinine, Ser: 1.09 mg/dL (ref 0.61–1.24)
GFR calc Af Amer: >60 mL/min (ref >60)
GFR calc non Af Amer: >60 mL/min (ref >60)
Glucose, Bld: 138 mg/dL — ABNORMAL HIGH (ref 65–99)
Potassium: 5.7 mmol/L — ABNORMAL HIGH (ref 3.5–5.1)
Sodium: 133 mmol/L — ABNORMAL LOW (ref 135–145)
Total Bilirubin: 0.5 mg/dL (ref 0.3–1.2)
Total Protein: 7.4 g/dL (ref 6.5–8.1)

## 2018-03-28 LAB — SURGICAL PCR SCREEN
MRSA, PCR: NEGATIVE
Staphylococcus aureus: NEGATIVE

## 2018-03-28 LAB — PREPARE RBC (CROSSMATCH)

## 2018-03-28 SURGERY — POSTERIOR LUMBAR FUSION 4 LEVEL
Anesthesia: General | Site: Back

## 2018-03-28 MED ORDER — LIDOCAINE 2% (20 MG/ML) 5 ML SYRINGE
INTRAMUSCULAR | Status: AC
  Filled 2018-03-28: qty 5

## 2018-03-28 MED ORDER — SUGAMMADEX SODIUM 500 MG/5ML IV SOLN
INTRAVENOUS | Status: AC
  Filled 2018-03-28: qty 5

## 2018-03-28 MED ORDER — CEFAZOLIN SODIUM-DEXTROSE 2-4 GM/100ML-% IV SOLN: 2.0000 g | Freq: Three times a day (TID) | INTRAVENOUS | Status: DC

## 2018-03-28 MED ORDER — PHENYLEPHRINE 40 MCG/ML (10ML) SYRINGE FOR IV PUSH (FOR BLOOD PRESSURE SUPPORT)
PREFILLED_SYRINGE | INTRAVENOUS | Status: AC
  Filled 2018-03-28: qty 10

## 2018-03-28 MED ORDER — LIDOCAINE-EPINEPHRINE 1 %-1:100000 IJ SOLN
INTRAMUSCULAR | Status: AC
  Filled 2018-03-28: qty 1

## 2018-03-28 MED ORDER — ROCURONIUM BROMIDE 10 MG/ML (PF) SYRINGE
PREFILLED_SYRINGE | INTRAVENOUS | Status: AC
  Filled 2018-03-28: qty 5

## 2018-03-28 MED ORDER — THROMBIN 5000 UNITS EX SOLR
CUTANEOUS | Status: AC
  Filled 2018-03-28: qty 5000

## 2018-03-28 MED ORDER — DOCUSATE SODIUM 100 MG PO CAPS
100.0000 mg | ORAL_CAPSULE | Freq: Two times a day (BID) | ORAL | Status: DC
  Administered 2018-03-28 – 2018-03-30 (×4): 100 mg via ORAL
  Filled 2018-03-28 (×4): qty 1

## 2018-03-28 MED ORDER — DEXTROSE 5 % IV SOLN
INTRAVENOUS | Status: DC | PRN
  Administered 2018-03-28: 25 ug/min via INTRAVENOUS

## 2018-03-28 MED ORDER — NALOXONE HCL 0.4 MG/ML IJ SOLN: 0.4000 mg | INTRAMUSCULAR | Status: DC | PRN

## 2018-03-28 MED ORDER — EPHEDRINE 5 MG/ML INJ
INTRAVENOUS | Status: AC
  Filled 2018-03-28: qty 10

## 2018-03-28 MED ORDER — LACTATED RINGERS IV SOLN
INTRAVENOUS | Status: DC
  Administered 2018-03-29 – 2018-03-30 (×3): via INTRAVENOUS

## 2018-03-28 MED ORDER — PROMETHAZINE HCL 25 MG/ML IJ SOLN: 6.2500 mg | INTRAMUSCULAR | Status: DC | PRN

## 2018-03-28 MED ORDER — FENTANYL CITRATE (PF) 250 MCG/5ML IJ SOLN
INTRAMUSCULAR | Status: AC
Start: 2018-03-28 — End: ?
  Filled 2018-03-28: qty 5

## 2018-03-28 MED ORDER — BUPIVACAINE HCL (PF) 0.5 % IJ SOLN
INTRAMUSCULAR | Status: AC
  Filled 2018-03-28: qty 30

## 2018-03-28 MED ORDER — ONDANSETRON HCL 4 MG/2ML IJ SOLN: 4.0000 mg | Freq: Four times a day (QID) | INTRAMUSCULAR | Status: DC | PRN

## 2018-03-28 MED ORDER — THROMBIN (RECOMBINANT) 20000 UNITS EX SOLR
CUTANEOUS | Status: DC | PRN
  Administered 2018-03-28: 07:00:00 via TOPICAL

## 2018-03-28 MED ORDER — BACITRACIN 50000 UNITS IM SOLR
INTRAMUSCULAR | Status: DC | PRN
  Administered 2018-03-28: 07:00:00

## 2018-03-28 MED ORDER — MIDAZOLAM HCL 5 MG/5ML IJ SOLN
INTRAMUSCULAR | Status: DC | PRN
  Administered 2018-03-28: 2 mg via INTRAVENOUS

## 2018-03-28 MED ORDER — SODIUM CHLORIDE 0.9 % IV SOLN: 250.0000 mL | INTRAVENOUS | Status: DC

## 2018-03-28 MED ORDER — LIDOCAINE 2% (20 MG/ML) 5 ML SYRINGE
INTRAMUSCULAR | Status: DC | PRN
  Administered 2018-03-28: 100 mg via INTRAVENOUS

## 2018-03-28 MED ORDER — MORPHINE SULFATE (PF) 4 MG/ML IV SOLN: 4.0000 mg | INTRAVENOUS | Status: DC | PRN

## 2018-03-28 MED ORDER — OXYCODONE HCL 5 MG PO TABS
10.0000 mg | ORAL_TABLET | ORAL | Status: DC | PRN
  Administered 2018-03-28 – 2018-03-30 (×6): 10 mg via ORAL
  Filled 2018-03-28 (×6): qty 2

## 2018-03-28 MED ORDER — THROMBIN (RECOMBINANT) 5000 UNITS EX SOLR
OROMUCOSAL | Status: DC | PRN
  Administered 2018-03-28 (×3): via TOPICAL

## 2018-03-28 MED ORDER — BUPIVACAINE HCL (PF) 0.5 % IJ SOLN
INTRAMUSCULAR | Status: DC | PRN
  Administered 2018-03-28: 10 mL

## 2018-03-28 MED ORDER — ARTIFICIAL TEARS OPHTHALMIC OINT
TOPICAL_OINTMENT | OPHTHALMIC | Status: DC | PRN
  Administered 2018-03-28: 1 via OPHTHALMIC

## 2018-03-28 MED ORDER — ROCURONIUM BROMIDE 10 MG/ML (PF) SYRINGE
PREFILLED_SYRINGE | INTRAVENOUS | Status: DC | PRN
  Administered 2018-03-28: 60 mg via INTRAVENOUS
  Administered 2018-03-28: 10 mg via INTRAVENOUS
  Administered 2018-03-28: 50 mg via INTRAVENOUS
  Administered 2018-03-28: 40 mg via INTRAVENOUS
  Administered 2018-03-28: 30 mg via INTRAVENOUS
  Administered 2018-03-28 (×3): 20 mg via INTRAVENOUS
  Administered 2018-03-28: 80 mg via INTRAVENOUS

## 2018-03-28 MED ORDER — ALBUMIN HUMAN 5 % IV SOLN
INTRAVENOUS | Status: DC | PRN
  Administered 2018-03-28 (×3): via INTRAVENOUS

## 2018-03-28 MED ORDER — KETAMINE HCL 10 MG/ML IJ SOLN
INTRAMUSCULAR | Status: DC | PRN
  Administered 2018-03-28 (×4): 10 mg via INTRAVENOUS

## 2018-03-28 MED ORDER — PROPOFOL 10 MG/ML IV BOLUS
INTRAVENOUS | Status: DC | PRN
  Administered 2018-03-28: 140 mg via INTRAVENOUS

## 2018-03-28 MED ORDER — KETAMINE HCL 10 MG/ML IJ SOLN
INTRAMUSCULAR | Status: AC
  Filled 2018-03-28: qty 1

## 2018-03-28 MED ORDER — ONDANSETRON HCL 4 MG/2ML IJ SOLN
INTRAMUSCULAR | Status: DC | PRN
  Administered 2018-03-28: 4 mg via INTRAVENOUS

## 2018-03-28 MED ORDER — FENTANYL CITRATE (PF) 100 MCG/2ML IJ SOLN: 25.0000 ug | INTRAMUSCULAR | Status: DC | PRN

## 2018-03-28 MED ORDER — SUGAMMADEX SODIUM 500 MG/5ML IV SOLN
INTRAVENOUS | Status: DC | PRN
  Administered 2018-03-28: 300 mg via INTRAVENOUS

## 2018-03-28 MED ORDER — FENTANYL CITRATE (PF) 100 MCG/2ML IJ SOLN
INTRAMUSCULAR | Status: DC | PRN
  Administered 2018-03-28 (×4): 50 ug via INTRAVENOUS
  Administered 2018-03-28: 100 ug via INTRAVENOUS
  Administered 2018-03-28 (×3): 50 ug via INTRAVENOUS

## 2018-03-28 MED ORDER — MENTHOL 3 MG MT LOZG: 1.0000 | LOZENGE | OROMUCOSAL | Status: DC | PRN

## 2018-03-28 MED ORDER — METHOCARBAMOL 500 MG PO TABS
500.0000 mg | ORAL_TABLET | Freq: Four times a day (QID) | ORAL | Status: DC | PRN
  Administered 2018-03-29 – 2018-03-30 (×2): 500 mg via ORAL
  Filled 2018-03-28 (×2): qty 1

## 2018-03-28 MED ORDER — SODIUM CHLORIDE 0.9% FLUSH: 3.0000 mL | INTRAVENOUS | Status: DC | PRN

## 2018-03-28 MED ORDER — POLYETHYLENE GLYCOL 3350 17 G PO PACK: 17.0000 g | PACK | Freq: Every day | ORAL | Status: DC | PRN

## 2018-03-28 MED ORDER — 0.9 % SODIUM CHLORIDE (POUR BTL) OPTIME
TOPICAL | Status: DC | PRN
  Administered 2018-03-28: 1000 mL

## 2018-03-28 MED ORDER — SENNA 8.6 MG PO TABS
1.0000 | ORAL_TABLET | Freq: Two times a day (BID) | ORAL | Status: DC
  Administered 2018-03-28 – 2018-03-30 (×4): 8.6 mg via ORAL
  Filled 2018-03-28 (×4): qty 1

## 2018-03-28 MED ORDER — METHOCARBAMOL 1000 MG/10ML IJ SOLN
500.0000 mg | Freq: Four times a day (QID) | INTRAVENOUS | Status: DC | PRN
  Filled 2018-03-28: qty 5

## 2018-03-28 MED ORDER — VASOPRESSIN 20 UNIT/ML IV SOLN
INTRAVENOUS | Status: DC | PRN
  Administered 2018-03-28 (×4): 1 [IU] via INTRAVENOUS

## 2018-03-28 MED ORDER — PHENOL 1.4 % MT LIQD: 1.0000 | OROMUCOSAL | Status: DC | PRN

## 2018-03-28 MED ORDER — VASOPRESSIN 20 UNIT/ML IV SOLN
INTRAVENOUS | Status: AC
  Filled 2018-03-28: qty 1

## 2018-03-28 MED ORDER — DEXAMETHASONE SODIUM PHOSPHATE 10 MG/ML IJ SOLN
INTRAMUSCULAR | Status: DC | PRN
  Administered 2018-03-28: 10 mg via INTRAVENOUS

## 2018-03-28 MED ORDER — EPHEDRINE SULFATE-NACL 50-0.9 MG/10ML-% IV SOSY
PREFILLED_SYRINGE | INTRAVENOUS | Status: DC | PRN
  Administered 2018-03-28 (×2): 10 mg via INTRAVENOUS
  Administered 2018-03-28: 20 mg via INTRAVENOUS

## 2018-03-28 MED ORDER — CEFAZOLIN SODIUM-DEXTROSE 2-3 GM-%(50ML) IV SOLR
INTRAVENOUS | Status: DC | PRN
  Administered 2018-03-28 (×2): 2 g via INTRAVENOUS

## 2018-03-28 MED ORDER — DIPHENHYDRAMINE HCL 50 MG/ML IJ SOLN
INTRAMUSCULAR | Status: DC | PRN
  Administered 2018-03-28: 12.5 mg via INTRAVENOUS

## 2018-03-28 MED ORDER — HYDROMORPHONE HCL 1 MG/ML IJ SOLN
1.0000 mg | INTRAMUSCULAR | Status: DC | PRN
  Administered 2018-03-28 – 2018-03-30 (×5): 1 mg via INTRAVENOUS
  Filled 2018-03-28 (×5): qty 1

## 2018-03-28 MED ORDER — LACTATED RINGERS IV SOLN
INTRAVENOUS | Status: DC | PRN
  Administered 2018-03-28 (×2): via INTRAVENOUS

## 2018-03-28 MED ORDER — ACETAMINOPHEN 650 MG RE SUPP: 650.0000 mg | RECTAL | Status: DC | PRN

## 2018-03-28 MED ORDER — ACETAMINOPHEN 325 MG PO TABS: 650.0000 mg | ORAL_TABLET | ORAL | Status: DC | PRN

## 2018-03-28 MED ORDER — SODIUM CHLORIDE 0.9% FLUSH
3.0000 mL | Freq: Two times a day (BID) | INTRAVENOUS | Status: DC
  Administered 2018-03-28 – 2018-03-29 (×2): 3 mL via INTRAVENOUS

## 2018-03-28 MED ORDER — LIDOCAINE-EPINEPHRINE 1 %-1:100000 IJ SOLN
INTRAMUSCULAR | Status: DC | PRN
  Administered 2018-03-28: 10 mL

## 2018-03-28 MED ORDER — PHENYLEPHRINE 40 MCG/ML (10ML) SYRINGE FOR IV PUSH (FOR BLOOD PRESSURE SUPPORT)
PREFILLED_SYRINGE | INTRAVENOUS | Status: DC | PRN
  Administered 2018-03-28: 40 ug via INTRAVENOUS
  Administered 2018-03-28 (×3): 120 ug via INTRAVENOUS

## 2018-03-28 MED ORDER — SODIUM CHLORIDE 0.9 % IV SOLN
Freq: Once | INTRAVENOUS | Status: AC
  Administered 2018-03-28: 06:00:00 via INTRAVENOUS

## 2018-03-28 MED ORDER — FLEET ENEMA 7-19 GM/118ML RE ENEM: 1.0000 | ENEMA | Freq: Once | RECTAL | Status: DC | PRN

## 2018-03-28 MED ORDER — OXYCODONE HCL 5 MG PO TABS
5.0000 mg | ORAL_TABLET | ORAL | Status: DC | PRN
  Administered 2018-03-28: 5 mg via ORAL
  Filled 2018-03-28: qty 1

## 2018-03-28 MED ORDER — THROMBIN 20000 UNITS EX SOLR
CUTANEOUS | Status: AC
  Filled 2018-03-28: qty 20000

## 2018-03-28 MED ORDER — ONDANSETRON HCL 4 MG/2ML IJ SOLN
INTRAMUSCULAR | Status: AC
  Filled 2018-03-28: qty 2

## 2018-03-28 MED ORDER — LABETALOL HCL 5 MG/ML IV SOLN
INTRAVENOUS | Status: AC
  Filled 2018-03-28: qty 4

## 2018-03-28 MED ORDER — FENTANYL CITRATE (PF) 250 MCG/5ML IJ SOLN
INTRAMUSCULAR | Status: AC
  Filled 2018-03-28: qty 5

## 2018-03-28 MED ORDER — DIPHENHYDRAMINE HCL 50 MG/ML IJ SOLN
INTRAMUSCULAR | Status: AC
  Filled 2018-03-28: qty 1

## 2018-03-28 MED ORDER — PROPOFOL 10 MG/ML IV BOLUS
INTRAVENOUS | Status: AC
  Filled 2018-03-28: qty 40

## 2018-03-28 MED ORDER — MIDAZOLAM HCL 2 MG/2ML IJ SOLN
INTRAMUSCULAR | Status: AC
  Filled 2018-03-28: qty 2

## 2018-03-28 MED ORDER — DEXAMETHASONE SODIUM PHOSPHATE 10 MG/ML IJ SOLN
INTRAMUSCULAR | Status: AC
  Filled 2018-03-28: qty 1

## 2018-03-28 MED ORDER — ALUM & MAG HYDROXIDE-SIMETH 200-200-20 MG/5ML PO SUSP: 30.0000 mL | Freq: Four times a day (QID) | ORAL | Status: DC | PRN

## 2018-03-28 MED ORDER — BISACODYL 10 MG RE SUPP: 10.0000 mg | Freq: Every day | RECTAL | Status: DC | PRN

## 2018-03-28 MED ORDER — THROMBIN 5000 UNITS EX SOLR
CUTANEOUS | Status: AC
  Filled 2018-03-28: qty 10000

## 2018-03-28 MED ORDER — ONDANSETRON HCL 4 MG PO TABS: 4.0000 mg | ORAL_TABLET | Freq: Four times a day (QID) | ORAL | Status: DC | PRN

## 2018-03-28 SURGICAL SUPPLY — 104 items
ADAPTER DRIVER T25 (MISCELLANEOUS) IMPLANT
ADH SKN CLS APL DERMABOND .7 (GAUZE/BANDAGES/DRESSINGS) ×2
APL SRG 60D 8 XTD TIP BNDBL (TIP)
BAG DECANTER FOR FLEXI CONT (MISCELLANEOUS) ×4 IMPLANT
BASKET BONE COLLECTION (BASKET) ×4 IMPLANT
BIT DRILL LONG 3.0X30 (BIT) ×1 IMPLANT
BIT DRILL LONG 3.0X30MM (BIT) ×1
BIT DRILL LONG 3X80 (BIT) IMPLANT
BIT DRILL LONG 3X80MM (BIT)
BIT DRILL LONG 4X80 (BIT) IMPLANT
BIT DRILL LONG 4X80MM (BIT)
BIT DRILL SHORT 3.0X30 (BIT) ×1 IMPLANT
BIT DRILL SHORT 3.0X30MM (BIT) ×1
BIT DRILL SHORT 3X80 (BIT) IMPLANT
BIT DRILL SHORT 3X80MM (BIT)
BLADE CLIPPER SURG (BLADE) IMPLANT
BLADE SURG 11 STRL SS (BLADE) ×4 IMPLANT
BONE CANC CHIPS 40CC CAN1/2 (Bone Implant) ×4 IMPLANT
BUR MATCHSTICK NEURO 3.0 LAGG (BURR) ×4 IMPLANT
CANISTER SUCT 3000ML PPV (MISCELLANEOUS) ×4 IMPLANT
CEMENT BONE KYPHX HV R (Orthopedic Implant) ×3 IMPLANT
CEMENT KYPHON C01A KIT/MIXER (Cement) ×2 IMPLANT
CHIPS CANC BONE 40CC CAN1/2 (Bone Implant) ×2 IMPLANT
CONT SPEC 4OZ CLIKSEAL STRL BL (MISCELLANEOUS) ×6 IMPLANT
COVER BACK TABLE 60X90IN (DRAPES) ×4 IMPLANT
DECANTER SPIKE VIAL GLASS SM (MISCELLANEOUS) ×4 IMPLANT
DERMABOND ADVANCED (GAUZE/BANDAGES/DRESSINGS) ×2
DERMABOND ADVANCED .7 DNX12 (GAUZE/BANDAGES/DRESSINGS) ×2 IMPLANT
DEVICE BONE FILLER KYPHON SZ3 (MISCELLANEOUS) IMPLANT
DEVICE DISSECT PLASMABLAD 3.0S (MISCELLANEOUS) ×2 IMPLANT
DRAPE C-ARM 42X72 X-RAY (DRAPES) ×6 IMPLANT
DRAPE C-ARMOR (DRAPES) ×3 IMPLANT
DRAPE HALF SHEET 40X57 (DRAPES) ×4 IMPLANT
DRAPE LAPAROTOMY 100X72X124 (DRAPES) ×4 IMPLANT
DRAPE SHEET LG 3/4 BI-LAMINATE (DRAPES) ×4 IMPLANT
DRIVER ADAPTER T25 (MISCELLANEOUS) ×40
DRSG OPSITE POSTOP 4X10 (GAUZE/BANDAGES/DRESSINGS) ×2 IMPLANT
DRSG OPSITE POSTOP 4X6 (GAUZE/BANDAGES/DRESSINGS) ×2 IMPLANT
DURAPREP 26ML APPLICATOR (WOUND CARE) ×4 IMPLANT
DURASEAL APPLICATOR TIP (TIP) IMPLANT
DURASEAL SPINE SEALANT 3ML (MISCELLANEOUS) IMPLANT
ELECT BLADE 4.0 EZ CLEAN MEGAD (MISCELLANEOUS) ×4
ELECT REM PT RETURN 9FT ADLT (ELECTROSURGICAL) ×4
ELECTRODE BLDE 4.0 EZ CLN MEGD (MISCELLANEOUS) IMPLANT
ELECTRODE REM PT RTRN 9FT ADLT (ELECTROSURGICAL) ×2 IMPLANT
EVACUATOR 3/16  PVC DRAIN (DRAIN) ×2
EVACUATOR 3/16 PVC DRAIN (DRAIN) IMPLANT
FILTER STRAW FLUID ASPIR (MISCELLANEOUS) ×2 IMPLANT
GAUZE SPONGE 4X4 12PLY STRL (GAUZE/BANDAGES/DRESSINGS) ×4 IMPLANT
GAUZE SPONGE 4X4 16PLY XRAY LF (GAUZE/BANDAGES/DRESSINGS) ×4 IMPLANT
GLOVE BIOGEL PI IND STRL 6.5 (GLOVE) ×4 IMPLANT
GLOVE BIOGEL PI IND STRL 8.5 (GLOVE) ×4 IMPLANT
GLOVE BIOGEL PI INDICATOR 6.5 (GLOVE) ×4
GLOVE BIOGEL PI INDICATOR 8.5 (GLOVE) ×4
GLOVE ECLIPSE 7.5 STRL STRAW (GLOVE) ×8 IMPLANT
GLOVE ECLIPSE 8.5 STRL (GLOVE) ×8 IMPLANT
GLOVE ECLIPSE 9.0 STRL (GLOVE) ×2 IMPLANT
GLOVE SURG SS PI 6.0 STRL IVOR (GLOVE) ×3 IMPLANT
GOWN STRL REUS W/ TWL LRG LVL3 (GOWN DISPOSABLE) IMPLANT
GOWN STRL REUS W/ TWL XL LVL3 (GOWN DISPOSABLE) IMPLANT
GOWN STRL REUS W/TWL 2XL LVL3 (GOWN DISPOSABLE) ×10 IMPLANT
GOWN STRL REUS W/TWL LRG LVL3 (GOWN DISPOSABLE) ×4
GOWN STRL REUS W/TWL XL LVL3 (GOWN DISPOSABLE)
GRAFT BNE CHIP CANC 1-8 40 (Bone Implant) IMPLANT
GUIDEWIRE 18IN BLUNT CD HORIZ (WIRE) ×20 IMPLANT
HEMOSTAT POWDER KIT SURGIFOAM (HEMOSTASIS) ×6 IMPLANT
KIT BASIN OR (CUSTOM PROCEDURE TRAY) ×4 IMPLANT
KIT INFUSE MEDIUM (Orthopedic Implant) ×2 IMPLANT
KIT SPINE MAZOR X ROBO DISP (MISCELLANEOUS) ×4 IMPLANT
KIT TURNOVER KIT B (KITS) ×4 IMPLANT
KYPHON BFD (MISCELLANEOUS) ×12
MILL MEDIUM DISP (BLADE) ×4 IMPLANT
NEEDLE HYPO 22GX1.5 SAFETY (NEEDLE) ×4 IMPLANT
NS IRRIG 1000ML POUR BTL (IV SOLUTION) ×4 IMPLANT
PACK LAMINECTOMY NEURO (CUSTOM PROCEDURE TRAY) ×4 IMPLANT
PAD ARMBOARD 7.5X6 YLW CONV (MISCELLANEOUS) ×12 IMPLANT
PATTIES SURGICAL .5 X1 (DISPOSABLE) ×4 IMPLANT
PIN HEAD 2.5X60MM (PIN) IMPLANT
PLASMABLADE 3.0S (MISCELLANEOUS) ×4
ROD 5.5MM SPINAL SOLERA (Rod) ×8 IMPLANT
SCREW SCHANZ SA 4.0MM (MISCELLANEOUS) IMPLANT
SCREW SET SOLERA (Screw) ×40 IMPLANT
SCREW SET SOLERA TI5.5 (Screw) IMPLANT
SCREW SOLERA 4.5X45 (Screw) ×8 IMPLANT
SCREW SOLERA 45X5.5XMA (Screw) IMPLANT
SCREW SOLERA 45X6.5XMA (Screw) IMPLANT
SCREW SOLERA 5.5X45MM (Screw) ×16 IMPLANT
SCREW SOLERA 6.5X45MM (Screw) ×8 IMPLANT
SPONGE LAP 4X18 X RAY DECT (DISPOSABLE) IMPLANT
SPONGE SURGIFOAM ABS GEL 100 (HEMOSTASIS) ×4 IMPLANT
SUT PROLENE 6 0 BV (SUTURE) IMPLANT
SUT VIC AB 1 CT1 18XBRD ANBCTR (SUTURE) ×2 IMPLANT
SUT VIC AB 1 CT1 8-18 (SUTURE) ×8
SUT VIC AB 2-0 CP2 18 (SUTURE) ×12 IMPLANT
SUT VIC AB 3-0 SH 8-18 (SUTURE) ×6 IMPLANT
SWAB CULTURE ESWAB REG 1ML (MISCELLANEOUS) ×2 IMPLANT
SWAB CULTURE LIQ STUART DBL (MISCELLANEOUS) ×2 IMPLANT
SYR 3ML LL SCALE MARK (SYRINGE) ×16 IMPLANT
TAPE CLOTH SURG 4X10 WHT LF (GAUZE/BANDAGES/DRESSINGS) ×2 IMPLANT
TOWEL GREEN STERILE (TOWEL DISPOSABLE) ×4 IMPLANT
TOWEL GREEN STERILE FF (TOWEL DISPOSABLE) ×4 IMPLANT
TRAY FOLEY W/METER SILVER 16FR (SET/KITS/TRAYS/PACK) ×4 IMPLANT
TUBE MAZOR SA REDUCTION (TUBING) ×6 IMPLANT
WATER STERILE IRR 1000ML POUR (IV SOLUTION) ×4 IMPLANT

## 2018-03-28 NOTE — Anesthesia Procedure Notes (Addendum)
Procedure Name: Intubation Date/Time: 03/28/2018 7:48 AM Performed by: Colin Benton, CRNA Pre-anesthesia Checklist: Patient identified, Emergency Drugs available, Suction available and Patient being monitored Patient Re-evaluated:Patient Re-evaluated prior to induction Oxygen Delivery Method: Circle System Utilized Preoxygenation: Pre-oxygenation with 100% oxygen Induction Type: IV induction Ventilation: Mask ventilation without difficulty Laryngoscope Size: Mac and 4 Grade View: Grade I Tube type: Oral Tube size: 7.5 mm Number of attempts: 1 Airway Equipment and Method: Stylet Placement Confirmation: ETT inserted through vocal cords under direct vision,  positive ETCO2 and breath sounds checked- equal and bilateral Secured at: 23 cm Tube secured with: Tape Dental Injury: Teeth and Oropharynx as per pre-operative assessment

## 2018-03-28 NOTE — Transfer of Care (Signed)
Immediate Anesthesia Transfer of Care Note  Patient: Cody Velez  Procedure(s) Performed: Thoracic eight -Lumbar two- FIXATION WITH SCREW PLACEMENT, DECOMPRESSION Thoracic ten-Thoracic eleven  FOR OSTEOMYELITIS (N/A Back) APPLICATION OF ROBOTIC ASSISTANCE FOR SPINAL PROCEDURE (N/A )  Patient Location: PACU  Anesthesia Type:General  Level of Consciousness: drowsy and patient cooperative  Airway & Oxygen Therapy: Patient Spontanous Breathing and Patient connected to nasal cannula oxygen  Post-op Assessment: Report given to RN and Post -op Vital signs reviewed and stable  Post vital signs: stable  Last Vitals:  Vitals Value Taken Time  BP 112/88 03/28/2018  2:12 PM  Temp    Pulse 96 03/28/2018  2:17 PM  Resp 22 03/28/2018  2:17 PM  SpO2 98 % 03/28/2018  2:17 PM  Vitals shown include unvalidated device data.  Last Pain:  Vitals:   03/27/18 2212  TempSrc:   PainSc: Asleep      Patients Stated Pain Goal: 4 (84/16/60 6301)  Complications: No apparent anesthesia complications

## 2018-03-28 NOTE — Progress Notes (Addendum)
Report given to CRNA. CRNA on duty said they can look up the chart. Will continue to monitor.   Consent order for surgery just put in. Consent completed. Will continue to monitor.

## 2018-03-28 NOTE — Progress Notes (Signed)
PROGRESS NOTE  Cody Velez  PRF:[id number] DOB: 10/29/1946 DOA: 03/15/2018 PCP: Asencion Noble, MD  HPI/Recap of past 24 hours: Cody Velez is a 72 y.o. male with medical history significant for chronic back pain with recent spinal cord stimulator placement several weeks ago, nephrolithiasis with recent admission for UTI, hypertension, dyslipidemia, morbid obesity, and CAD who was brought to the emergency department by family members due to worsening generalized weakness that led to a fall at home.  He actually saw urology earlier today as a follow-up appointment after recent admission to [facility] in [place] where he was noted to have some bilateral nephrolithiasis.  He was apparently prescribed some doxycycline during his urology visit today, likely due to suspicion of urinary tract infection.  He has had progressive confusion throughout the day and was noted to be hypoxemic upon arrival as well as hypoxemic during his urology visit earlier. He denies any dyspnea or chest pain.  No lower extremity edema, orthopnea, or dyspnea on exertion.  He reportedly had a stress test with no concerning findings during this recent hospitalization. He complains of ongoing low back pain that is chronic for him.  03/23/2018: Patient seen and examined at his bedside.  He reports persistent lower back pain and weakness in his lower extremities.  Denies chills or night sweats.  Has no new complaints.  03/24/2018: Patient seen and examined with his friend at his bedside.  Reports pain in his lower back which is improved after he takes pain medications.  Started on bowel regimen.  03/25/18: No new complaints. Report his back pain at its worse is 10/10 after pain med 5/10.  03/26/18 severe dull lower back pain reported this am. IV dilaudid provided prn. Denies nausea or abd pian.  03/27/18: persistent lower back pain and lower extremity weakness. Spoke with neurosurgery Dr Ellene Route who will take the patient to surgery tomorrow.  NPO after midnight.  03/28/18: patient seen and examined with his daughters and male friend at his bedside. He is POD #0 post laminectomy T10-T11 decompression of epidural abscess debridement of discitis T10-T11 segmental fixation from T8 through L3. Reports severe pain in his lower back. Pain management in place with IV narcan as needed.  Assessment/Plan: Principal Problem:   Bacteremia due to methicillin susceptible Staphylococcus aureus (MSSA) Active Problems:   HYPERCHOLESTEROLEMIA   Essential hypertension, benign   CAD, NATIVE VESSEL   GERD   Chronic back pain   History of cerebrovascular disease   Weakness   Fever   Sepsis ([place])   Nephrolithiasis   AKI (acute kidney injury) ([place])   Acute metabolic encephalopathy   Cognitive impairment   Chronic pain syndrome   Obesity   Staphylococcus aureus sepsis ([facility])   Encephalopathy   Weakness of both lower extremities   Discitis of thoracic region   Spinal cord stimulator status   Abdominal distension  Sepsis secondary to MSSA bacteremia/suspected T10-T11 osteomyelitis On admission culture positive 1 out of 2 bottles for MSSA TEE unremarkable ID following Continue IV cefazolin day #6 PICC line for prolonged IV antibiotics Plan for IV cefazolin times 6 weeks No plan for cervical spinal stimulator removal by neurosurgery. Afebrile with no leukocytosis Tmax 99.2 (03/27/18) Continue to monitor fever curve Continue to monitor CBC  Osteomyelitis T10-T11 with epidural abscess and debridement of discitis POD #0 status post laminectomy T10-T11 decompression of epidural abscess, debridement of discitis T10-T11. Sed rate 94 and CRP 17 on 03/21/2018 Currently on IV cefazolin for a 6 weeks duration ID following.  Highly appreciated Repeat CBC in the morning Pain management in place with IV Dilaudid every 3 hours as needed for severe pain Oxycodone IR for moderate pain every 4 hours as needed Narcan available as needed if patient becomes  too drowsy  Intractable lower back pain with lower extremity weakness 2/2 to osteomyelitis at T10-T11 Added IV dilaudid 03/26/18 as needed Bowel regimen in place to avoid opioids induced constipation Neurosurgery suspects an epidural abscess Planned for surgery in the morning NPO after midnight/stop lovenox  Severe protein calorie malnutrition Albumin 2.1 on 03/27/2018 from 2.0 Continue oral supplementation Encourage p.o. intake  Generalized weakness/physical debility Defer to neurosurgery to the extend of PT Increase po intake, supplement We need short-term rehab facility placement Social worker consulted for SNF placement  Hyponatremia, euvolemic Na+ 132 Repeat labs am  Hypokalemia, resolved Potassium 4.3 from 3.4 post repletion Repeat BMP in the morning  Chronic back pain status post spinal stimulator placement Neurosurgery following. Highly appreciated. Continue pain management  Obesity BMI 35 Weight loss outpatient  Depression/anxiety Continue Effexor, trazodone  GERD Continue Protonix.  Constipation Most likely exacerbated by opiates Bowel regimen in place with Senokot and Dulcolax    Code Status: Full  Family Communication: Friend at bedside  Disposition Plan: Home when clinically stable   Consultants:  Infectious disease  neurosurgery  Procedures:  None  Antimicrobials:  IV cefazolin  DVT prophylaxis: SCDs, sq lovenox daily  Objective: Vitals:   03/28/18 1511 03/28/18 1515 03/28/18 1545 03/28/18 1600  BP: 122/82  129/86 131/83  Pulse: 92  95 96  Resp: 14  19 18   Temp:  97.8 F (36.6 C)    TempSrc:      SpO2: 100%  97% 97%  Weight:      Height:        Intake/Output Summary (Last 24 hours) at 03/28/2018 1722 Last data filed at 03/28/2018 1515 Gross per 24 hour  Intake 2650 ml  Output 1325 ml  Net 1325 ml   Filed Weights   03/25/18 0336 03/26/18 0531 03/27/18 0515  Weight: 131.9 kg (290 lb 12.6 oz) 131.7 kg (290 lb 5.5 oz)  130.4 kg (287 lb 7.7 oz)     Exam: 03/28/18  General: 72 year old Caucasian male well-developed well-nourished no acute distress.  Alert and oriented x3.  Cardiovascular: Regular rate and rhythm with no rubs or gallops.  No JVD or thyromegaly present.  Respiratory: Clear to auscultation with no wheezes or rales.  Abdomen:  Obese nontender nondistended normal bowel sounds x4 quadrant.  Musculoskeletal: Good strength in upper extremities bilaterally.  Unable to assess lower extremity strength due to severe lower back pain and somnolence post surgery.  Skin: no ulcerative lesions.  Psychiatry: Mood is appropriate condition and setting.   Data Reviewed: CBC: Recent Labs  Lab 03/24/18 1007 03/26/18 1645 03/27/18 0455  WBC 11.6* 9.6 10.1  HGB 10.7* 11.2* 11.3*  HCT 34.2* 35.2* 36.4*  MCV 84.2 84.8 84.8  PLT 645* 732* 412*   Basic Metabolic Panel: Recent Labs  Lab 03/24/18 0423 03/26/18 1645 03/27/18 0455  NA 133* 133* 132*  K 4.3 4.7 4.8  CL 99* 99* 97*  CO2 26 26 25   GLUCOSE 87 105* 95  BUN 11 15 14   CREATININE 0.86 0.97 0.96  CALCIUM 8.5* 8.9 9.0   GFR: Estimated Creatinine Clearance: 102.7 mL/min (by C-G formula based on SCr of 0.96 mg/dL). Liver Function Tests: Recent Labs  Lab 03/26/18 1645 03/27/18 0455  AST 53* 47*  ALT 36  34  ALKPHOS 105 109  BILITOT 0.3 0.4  PROT 7.8 7.8  ALBUMIN 2.0* 2.1*   No results for input(s): LIPASE, AMYLASE in the last 168 hours. Recent Labs  Lab 03/22/18 1201  AMMONIA 28   Coagulation Profile: No results for input(s): INR, PROTIME in the last 168 hours. Cardiac Enzymes: No results for input(s): CKTOTAL, CKMB, CKMBINDEX, TROPONINI in the last 168 hours. BNP (last 3 results) No results for input(s): PROBNP in the last 8760 hours. HbA1C: No results for input(s): HGBA1C in the last 72 hours. CBG: No results for input(s): GLUCAP in the last 168 hours. Lipid Profile: No results for input(s): CHOL, HDL, LDLCALC, TRIG,  CHOLHDL, LDLDIRECT in the last 72 hours. Thyroid Function Tests: No results for input(s): TSH, T4TOTAL, FREET4, T3FREE, THYROIDAB in the last 72 hours. Anemia Panel: No results for input(s): VITAMINB12, FOLATE, FERRITIN, TIBC, IRON, RETICCTPCT in the last 72 hours. Urine analysis:    Component Value Date/Time   COLORURINE AMBER (A) 03/15/2018 1830   APPEARANCEUR CLOUDY (A) 03/15/2018 1830   LABSPEC 1.029 03/15/2018 1830   PHURINE 5.0 03/15/2018 1830   GLUCOSEU 50 (A) 03/15/2018 1830   HGBUR MODERATE (A) 03/15/2018 1830   BILIRUBINUR MODERATE (A) 03/15/2018 1830   KETONESUR NEGATIVE 03/15/2018 1830   PROTEINUR 100 (A) 03/15/2018 1830   UROBILINOGEN 0.2 03/05/2011 1909   NITRITE NEGATIVE 03/15/2018 1830   LEUKOCYTESUR NEGATIVE 03/15/2018 1830   Sepsis Labs: @LABRCNTIP (procalcitonin:4,lacticidven:4)  ) Recent Results (from the past 240 hour(s))  C difficile quick scan w PCR reflex     Status: None   Collection Time: 03/19/18  5:04 PM  Result Value Ref Range Status   C Diff antigen NEGATIVE NEGATIVE Final   C Diff toxin NEGATIVE NEGATIVE Final   C Diff interpretation No C. difficile detected.  Final    Comment: VALID Performed at [facility], [address], [place], [place] [address]   Gastrointestinal Panel by PCR , Stool     Status: None   Collection Time: 03/19/18  5:04 PM  Result Value Ref Range Status   Campylobacter species NOT DETECTED NOT DETECTED Final   Plesimonas shigelloides NOT DETECTED NOT DETECTED Final   Salmonella species NOT DETECTED NOT DETECTED Final   Yersinia enterocolitica NOT DETECTED NOT DETECTED Final   Vibrio species NOT DETECTED NOT DETECTED Final   Vibrio cholerae NOT DETECTED NOT DETECTED Final   Enteroaggregative E coli (EAEC) NOT DETECTED NOT DETECTED Final   Enteropathogenic E coli (EPEC) NOT DETECTED NOT DETECTED Final   Enterotoxigenic E coli (ETEC) NOT DETECTED NOT DETECTED Final   Shiga like toxin producing E coli (STEC) NOT DETECTED  NOT DETECTED Final   Shigella/Enteroinvasive E coli (EIEC) NOT DETECTED NOT DETECTED Final   Cryptosporidium NOT DETECTED NOT DETECTED Final   Cyclospora cayetanensis NOT DETECTED NOT DETECTED Final   Entamoeba histolytica NOT DETECTED NOT DETECTED Final   Giardia lamblia NOT DETECTED NOT DETECTED Final   Adenovirus F40/41 NOT DETECTED NOT DETECTED Final   Astrovirus NOT DETECTED NOT DETECTED Final   Norovirus GI/GII NOT DETECTED NOT DETECTED Final   Rotavirus A NOT DETECTED NOT DETECTED Final   Sapovirus (I, II, IV, and V) NOT DETECTED NOT DETECTED Final    Comment: Performed at [facility], [address] [place], [place] [address]  Surgical pcr screen     Status: None   Collection Time: 03/27/18 11:04 PM  Result Value Ref Range Status   MRSA, PCR NEGATIVE NEGATIVE Final  Staphylococcus aureus NEGATIVE NEGATIVE Final    Comment: (NOTE) The Xpert SA Assay (FDA approved for NASAL specimens in patients 62 years of age and older), is one component of a comprehensive surveillance program. It is not intended to diagnose infection nor to guide or monitor treatment. Performed at [facility], [place] [address] [place], [place] [address]   Aerobic/Anaerobic Culture (surgical/deep wound)     Status: None (Preliminary result)   Collection Time: 03/28/18  9:51 AM  Result Value Ref Range Status   Specimen Description WOUND  Final   Special Requests DISCS T10 T11  Final   Gram Stain   Final    RARE WBC PRESENT,BOTH PMN AND MONONUCLEAR NO ORGANISMS SEEN Performed at [facility], [place] [address], [place], [place] [address]    Culture PENDING  Incomplete   Report Status PENDING  Incomplete      Studies: Dg Thoracolumabar Spine  Result Date: 03/28/2018 CLINICAL DATA:  T8 through L2 fixation. FLUOROSCOPY TIME:  1 minutes 8 seconds. EXAM: THORACOLUMBAR SPINE 1V COMPARISON:  CT scan of March 20, 2018. FINDINGS: Status post surgical posterior fusion extending from T8 to L2  with bilateral intrapedicular screw placement. Good alignment of vertebral bodies is noted. IMPRESSION: Status post surgical posterior fusion of thoracolumbar junction as described above. Electronically Signed   By: Marijo Conception, M.D.   On: 03/28/2018 13:32   Dg Lumbar Spine 1 View  Result Date: 03/28/2018 CLINICAL DATA:  T8 through L2 fusion EXAM: LUMBAR SPINE - 1 VIEW COMPARISON:  None. FINDINGS: Single cross-table lateral x-ray of the lumbar spine is provided. Posterior tissue retractors are noted. Posterior lumbar interbody fusion from L1 through L3. Interbody fusion at L3-4. Interbody cages at L4-5 and L5-S1. IMPRESSION: Intraoperative localization as described above. Electronically Signed   By: Kathreen Devoid   On: 03/28/2018 09:37   Dg C-arm 1-60 Min  Result Date: 03/28/2018 CLINICAL DATA:  T8 through L2 fixation. FLUOROSCOPY TIME:  1 minutes 8 seconds. EXAM: THORACOLUMBAR SPINE 1V COMPARISON:  CT scan of March 20, 2018. FINDINGS: Status post surgical posterior fusion extending from T8 to L2 with bilateral intrapedicular screw placement. Good alignment of vertebral bodies is noted. IMPRESSION: Status post surgical posterior fusion of thoracolumbar junction as described above. Electronically Signed   By: Marijo Conception, M.D.   On: 03/28/2018 13:32   Dg C-arm 1-60 Min  Result Date: 03/28/2018 CLINICAL DATA:  T8 through L2 fixation. FLUOROSCOPY TIME:  1 minutes 8 seconds. EXAM: THORACOLUMBAR SPINE 1V COMPARISON:  CT scan of March 20, 2018. FINDINGS: Status post surgical posterior fusion extending from T8 to L2 with bilateral intrapedicular screw placement. Good alignment of vertebral bodies is noted. IMPRESSION: Status post surgical posterior fusion of thoracolumbar junction as described above. Electronically Signed   By: Marijo Conception, M.D.   On: 03/28/2018 13:32    Scheduled Meds: . amLODipine  10 mg Oral Daily  . aspirin EC  81 mg Oral Daily  . chlorhexidine  15 mL Mouth Rinse BID  .  cycloSPORINE  1 drop Both Eyes BID  . docusate sodium  100 mg Oral BID  . donepezil  10 mg Oral QHS  . feeding supplement (ENSURE ENLIVE)  237 mL Oral BID BM  . finasteride  5 mg Oral Daily  . gabapentin  600 mg Oral TID  . ipratropium-albuterol  3 mL Nebulization BID  . latanoprost  1 drop Both Eyes QHS  . mouth rinse  15 mL Mouth Rinse q12n4p  . memantine  10 mg Oral BID  . pantoprazole  40 mg Oral Daily  . potassium chloride  20 mEq Oral BID  . pravastatin  10 mg Oral QPM  . saccharomyces boulardii  250 mg Oral BID WC  . senna  1 tablet Oral BID  . sodium chloride flush  10-40 mL Intracatheter Q12H  . sodium chloride flush  3 mL Intravenous Q12H  . traZODone  100 mg Oral QHS  . venlafaxine XR  150 mg Oral BID  . vitamin B-12  1,000 mcg Oral Daily    Continuous Infusions: . sodium chloride    .  ceFAZolin (ANCEF) IV Stopped (03/28/18 0308)  . lactated ringers 125 mL/hr at 03/28/18 1549  . methocarbamol (ROBAXIN)  IV       LOS: 12 days     Kayleen Memos, MD [facility] Pager [phone]  If 7PM-7AM, please contact night-coverage [email or web address] Password [email or web address] 03/28/2018, 5:22 PM

## 2018-03-28 NOTE — Progress Notes (Signed)
Patient ID: Cody Velez, male   DOB: May 30, 1946, 72 y.o.   MRN: [id number] No mri  For surgery today Surgical robotic plan reviewed discussed and verified with [name].

## 2018-03-28 NOTE — Progress Notes (Signed)
Orthopedic Tech Progress Note Patient Details:  Cody Velez 1946-03-22 [id number] Called [employer] for brace. Patient ID: Cody Velez, male   DOB: 1946-11-06, 72 y.o.   MRN: [id number]   Cody Velez 03/28/2018, 3:42 PM

## 2018-03-28 NOTE — Anesthesia Procedure Notes (Addendum)
Arterial Line Insertion Start/End4/08/2018 6:43 AM, 03/28/2018 6:55 AM Performed by: Colin Benton, CRNA, CRNA  Patient location: Pre-op. Preanesthetic checklist: patient identified, IV checked, site marked, risks and benefits discussed, surgical consent, monitors and equipment checked, pre-op evaluation, timeout performed and anesthesia consent Lidocaine 1% used for infiltration and patient sedated Right, radial was placed Catheter size: 20 G Hand hygiene performed , maximum sterile barriers used  and Seldinger technique used Allen's test indicative of satisfactory collateral circulation Attempts: 1 Procedure performed without using ultrasound guided technique. Following insertion, dressing applied and Biopatch. Post procedure assessment: normal  Patient tolerated the procedure well with no immediate complications.

## 2018-03-28 NOTE — Op Note (Signed)
Date of surgery: 03/28/2018 Preoperative diagnosis: Osteomyelitis T10-T11 with epidural abscess and paraparesis.  History of lumbar fusion L1-S1. Postoperative diagnosis: Same Procedure: Laminectomy T10-T11 decompression of epidural abscess, debridement of discitis T10-T11.  Segmental fixation from  T8-L3.  Methacrylate screw augmentation at T8-T9 and T12.  Robotic assistance for placement of pedicle screws.  Posterior lateral arthrodesis with allograft and infuse.  Surgeon: Kristeen Miss. First assistant: Deri Fuelling MD Anesthesia: General endotracheal Indications: Cody Velez is a 72 year old individual who has had extensive lumbar and cervical spine surgery.  He had placement of a cervical spinal cord stimulator some months ago.  He however developed significant weakness in his legs with substantial back pain.  A CT scan of the thoracolumbar spine demonstrated edematous changes at the T11 T10 area.  On examination he had severe paraparesis with 1 out of 5 strength in the left lower extremity and 3 out of 5 strength in the right lower extremity.  It is felt that this was likely due to epidural abscess and discitis at the T10-T11 level.  Because he cannot have an MRI immediately was advised that he undergo surgical exploration debridement and stabilization.  Procedure: The patient was brought to the operating room supine on the stretcher.  After the smooth induction of general endotracheal anesthesia he was turned prone onto the [place] table.  His back was prepped with alcohol and DuraPrep and draped in sterile fashion.  Midline incision was created over the upper portion of his lumbar incision this was dissected down to the lumbodorsal fascia.  The top of the old hardware at L1 was identified and then the dissection was carried cephalad to expose T12 T11 T10 and T9.  T10-T11 was identified positively on a radiograph and then a laminectomy was undertaken in this region removing the inferior margin of the  laminar arch of T10 and the superior margin of T11.  Laminectomy was enlarged both cephalad and inferiorly and in the epidural space there is noted to be substantial phlegmon at this material that was resected from the epidural space.  Some significant edema was noted in this area but no frank pus was identified.  Then on the left lateral margin of the disc space at T10-T11 was explored and here there was noted to be more phlegmon which was gradually mobilized and when the disc space was entered there was evidence of pus within the disc space.  Cultures were sent from this area along with the surgical specimens.  The space was debrided of a substantial quantity of very desiccated degenerated disc material that was clearly involved with an infection or inflammation.  As the dissection continued the epidural space became more open and patulous.  Once the decompression was completed the area was copiously irrigated with antibiotic irrigating solution.  Then the Mazor robot was used to locate pedicle entry sites for T8-T9 T10-T11-T12 and L1.  As 4.5 x 45 mm screws were placed in T8 and T9-T10 and T11 had 5.5 x 45 mm screws placed at T12 had 5.5 x 45 mm screws placed and L1 had 6.5 x 45 mm screws placed.  Because of the nature of the infection and the patient's seeming presence of osteoporosis is decided to augment the screws at T8-T9 and L1 T10 and T11 which was involved with the infection did not undergo augmentation with methacrylate.  Then a 260 mm rod was measured and contoured to fit between the screw heads from L3-T8.  Was first placed on the right side  and provisionally tightened and the left-sided rod was cut fitted and placed into the sagittals between T8 and L3 on the left side.  Once the screws were placed a final tightening was performed and then radiographic confirmation of the construct was obtained.  The spinous processes and the lateral gutters from T8 then to L2 were decorticated and a mixture of 40 cc  of cancellus allograft mixed with a medium size infuse was packed into this posterior and posterior lateral region from T8-L2.  With this hemostasis in the soft tissues was obtained meticulously and the wound was closed with a large Hemovac drain.  Total blood loss was estimated at 350 cc cell saver blood was returned to the patient the lumbodorsal fascia was closed with #1 Vicryl 2-0 Vicryl was used in the subcutaneous tissues and 3-0 Vicryl subcuticularly with surgical staples on the skin.  Dry sterile dressing was applied to the patient's back.

## 2018-03-28 NOTE — Anesthesia Postprocedure Evaluation (Signed)
Anesthesia Post Note  Patient: Cody Velez  Procedure(s) Performed: Thoracic eight -Lumbar two- FIXATION WITH SCREW PLACEMENT, DECOMPRESSION Thoracic ten-Thoracic eleven  FOR OSTEOMYELITIS (N/A Back) APPLICATION OF ROBOTIC ASSISTANCE FOR SPINAL PROCEDURE (N/A )     Patient location during evaluation: PACU Anesthesia Type: General Level of consciousness: sedated Pain management: pain level controlled Vital Signs Assessment: post-procedure vital signs reviewed and stable Respiratory status: spontaneous breathing and respiratory function stable Cardiovascular status: stable Postop Assessment: no apparent nausea or vomiting Anesthetic complications: no    Last Vitals:  Vitals:   03/28/18 1511 03/28/18 1515  BP: 122/82   Pulse: 92   Resp: 14   Temp:  36.6 C  SpO2: 100%     Last Pain:  Vitals:   03/28/18 1515  TempSrc:   PainSc: 0-No pain    LLE Motor Response: Responds to commands (03/28/18 1515)   RLE Motor Response: Responds to commands (03/28/18 1515)        Rainen Vanrossum DANIEL

## 2018-03-29 DIAGNOSIS — Z981 Arthrodesis status: Secondary | ICD-10-CM

## 2018-03-29 DIAGNOSIS — Z95828 Presence of other vascular implants and grafts: Secondary | ICD-10-CM

## 2018-03-29 DIAGNOSIS — M4644 Discitis, unspecified, thoracic region: Secondary | ICD-10-CM

## 2018-03-29 DIAGNOSIS — G061 Intraspinal abscess and granuloma: Secondary | ICD-10-CM

## 2018-03-29 DIAGNOSIS — Z9889 Other specified postprocedural states: Secondary | ICD-10-CM

## 2018-03-29 DIAGNOSIS — G062 Extradural and subdural abscess, unspecified: Secondary | ICD-10-CM

## 2018-03-29 DIAGNOSIS — A4101 Sepsis due to Methicillin susceptible Staphylococcus aureus: Principal | ICD-10-CM

## 2018-03-29 LAB — POCT I-STAT 7, (LYTES, BLD GAS, ICA,H+H)
Acid-Base Excess: 1 mmol/L (ref 0.0–2.0)
Acid-Base Excess: 1 mmol/L (ref 0.0–2.0)
Bicarbonate: 24.6 mmol/L (ref 20.0–28.0)
Bicarbonate: 25.6 mmol/L (ref 20.0–28.0)
Calcium, Ion: 1.21 mmol/L (ref 1.15–1.40)
Calcium, Ion: 1.22 mmol/L (ref 1.15–1.40)
HCT: 36 % — ABNORMAL LOW (ref 39.0–52.0)
HCT: 32 % — ABNORMAL LOW (ref 39.0–52.0)
Hemoglobin: 12.2 g/dL — ABNORMAL LOW (ref 13.0–17.0)
Hemoglobin: 10.9 g/dL — ABNORMAL LOW (ref 13.0–17.0)
O2 Saturation: 99 %
O2 Saturation: 99 %
Patient temperature: 37
Patient temperature: 37.2
Potassium: 4.9 mmol/L (ref 3.5–5.1)
Potassium: 5.9 mmol/L — ABNORMAL HIGH (ref 3.5–5.1)
Sodium: 135 mmol/L (ref 135–145)
Sodium: 135 mmol/L (ref 135–145)
TCO2: 26 mmol/L (ref 22–32)
TCO2: 27 mmol/L (ref 22–32)
pCO2 arterial: 34.9 mmHg (ref 32.0–48.0)
pCO2 arterial: 42.1 mmHg (ref 32.0–48.0)
pH, Arterial: 7.457 — ABNORMAL HIGH (ref 7.350–7.450)
pH, Arterial: 7.394 (ref 7.350–7.450)
pO2, Arterial: 138 mmHg — ABNORMAL HIGH (ref 83.0–108.0)
pO2, Arterial: 166 mmHg — ABNORMAL HIGH (ref 83.0–108.0)

## 2018-03-29 LAB — CBC
HCT: 29.6 % — ABNORMAL LOW (ref 39.0–52.0)
Hemoglobin: 8.9 g/dL — ABNORMAL LOW (ref 13.0–17.0)
MCH: 25.8 pg — ABNORMAL LOW (ref 26.0–34.0)
MCHC: 30.1 g/dL (ref 30.0–36.0)
MCV: 85.8 fL (ref 78.0–100.0)
Platelets: 619 10*3/uL — ABNORMAL HIGH (ref 150–400)
RBC: 3.45 MIL/uL — ABNORMAL LOW (ref 4.22–5.81)
RDW: 16.7 % — ABNORMAL HIGH (ref 11.5–15.5)
WBC: 12.8 10*3/uL — ABNORMAL HIGH (ref 4.0–10.5)

## 2018-03-29 LAB — COMPREHENSIVE METABOLIC PANEL
ALT: 17 U/L (ref 17–63)
AST: 43 U/L — ABNORMAL HIGH (ref 15–41)
Albumin: 2.3 g/dL — ABNORMAL LOW (ref 3.5–5.0)
Alkaline Phosphatase: 83 U/L (ref 38–126)
Anion gap: 9 (ref 5–15)
BUN: 19 mg/dL (ref 6–20)
CO2: 25 mmol/L (ref 22–32)
Calcium: 8.7 mg/dL — ABNORMAL LOW (ref 8.9–10.3)
Chloride: 97 mmol/L — ABNORMAL LOW (ref 101–111)
Creatinine, Ser: 1.03 mg/dL (ref 0.61–1.24)
GFR calc Af Amer: >60 mL/min (ref >60)
GFR calc non Af Amer: >60 mL/min (ref >60)
Glucose, Bld: 117 mg/dL — ABNORMAL HIGH (ref 65–99)
Potassium: 4.7 mmol/L (ref 3.5–5.1)
Sodium: 131 mmol/L — ABNORMAL LOW (ref 135–145)
Total Bilirubin: 0.4 mg/dL (ref 0.3–1.2)
Total Protein: 6.7 g/dL (ref 6.5–8.1)

## 2018-03-29 MED FILL — Heparin Sodium (Porcine) Inj 1000 Unit/ML: INTRAMUSCULAR | Qty: 30 | Status: AC

## 2018-03-29 MED FILL — Heparin Sodium (Porcine) 2 Unit/ML in Sodium Chloride 0.9%: INTRAMUSCULAR | Qty: 1000 | Status: AC

## 2018-03-29 MED FILL — Thrombin For Soln 20000 Unit: CUTANEOUS | Qty: 1 | Status: AC

## 2018-03-29 MED FILL — Gelatin Absorbable MT Powder: OROMUCOSAL | Qty: 1 | Status: AC

## 2018-03-29 MED FILL — Thrombin For Soln 5000 Unit: CUTANEOUS | Qty: 5000 | Status: AC

## 2018-03-29 MED FILL — Sodium Chloride IV Soln 0.9%: INTRAVENOUS | Qty: 1000 | Status: AC

## 2018-03-29 MED FILL — Nitroglycerin IV Soln 100 MCG/ML in D5W: INTRA_ARTERIAL | Qty: 10 | Status: AC

## 2018-03-29 NOTE — Evaluation (Signed)
Occupational Therapy Evaluation Patient Details Name: Cody Velez MRN: [id number] DOB: 1946/09/07 Today's Date: 03/29/2018    History of Present Illness Cody Velez is a 72 y.o. male with medical history significant for chronic back pain with recent spinal cord stimulator placement several weeks ago, nephrolithiasis with recent admission for UTI, hypertension, dyslipidemia, morbid obesity, and CAD who was admitted due to weakness and fall at home.  Found to have osteomyelitis T10-11 and patient underwent laminectomy T10-T11 decompression of epidural abscess, debridement of discitis T10-11 and segmental fixation from T8 through L3.   Clinical Impression   PTA, pt was living with his significant other and was independent. Pt currently requiring Max A for UB ADLs, Max A +2 for LB ADLs, and Max A-Total A+2 for sit<>stand transfer with sara stedy. Pt presenting with poor sitting/standing balance, decreased BLE sensation, generalized weakness, and poor functional performance. Pt motivated to participate in therapy and has good family support with daughter present throughout session. Pt will require further acute OT to optimize independence and safety with ADLs and functional mobility. Recommend dc to CIR for intensive OT to facilitate return to PLOF, maximize independence with ADLs and functional mobility, and decrease caregiver burden.    Follow Up Recommendations  CIR;Supervision/Assistance - 24 hour    Equipment Recommendations  Other (comment)(Defer to next venue)    Recommendations for Other Services PT consult;Rehab consult     Precautions / Restrictions Precautions Precautions: Fall;Back Precaution Booklet Issued: No Precaution Comments: Pt able to recall BLT with Min VCs. Pt use spinal body mechanics to protect for pain and prior diagnoses Required Braces or Orthoses: Spinal Brace Spinal Brace: Thoracolumbosacral orthotic;Applied in sitting position Restrictions Weight  Bearing Restrictions: No Other Position/Activity Restrictions: sit upright in chair for spinal protection      Mobility Bed Mobility Overal bed mobility: Needs Assistance Bed Mobility: Rolling;Sidelying to Sit;Sit to Sidelying Rolling: Max assist;+2 for physical assistance Sidelying to sit: Max assist;+2 for physical assistance     Sit to sidelying: Max assist;+2 for physical assistance General bed mobility comments: +rail, assist with BLE and to elevate trunk; cues for technique, increased time  Transfers Overall transfer level: Needs assistance   Transfers: Sit to/from Stand Sit to Stand: Max assist;+2 physical assistance;From elevated surface         General transfer comment: Max A +2 to clear bottom from EOB. DUring second attempt, pt able to raise bottom enough to bring bari stedy seat down. Pt fatiguing quickly and then requiring Total A to laterally lean and remove stedy seat.    Balance Overall balance assessment: Needs assistance Sitting-balance support: Feet supported;Bilateral upper extremity supported Sitting balance-Leahy Scale: Poor Sitting balance - Comments: requiring UE for support, and as fatigued needed up to mod A   Standing balance support: Bilateral upper extremity supported;During functional activity Standing balance-Leahy Scale: Zero Standing balance comment: could not stand fully                           ADL either performed or assessed with clinical judgement   ADL Overall ADL's : Needs assistance/impaired Eating/Feeding: Set up;Bed level   Grooming: Set up;Bed level   Upper Body Bathing: Moderate assistance;Bed level   Lower Body Bathing: Maximal assistance;+2 for physical assistance;Bed level   Upper Body Dressing : Moderate assistance;Bed level   Lower Body Dressing: Maximal assistance;+2 for physical assistance;Bed level       Toileting- Clothing Manipulation and Hygiene: Maximal assistance;+2  for physical assistance;Bed  level Toileting - Clothing Manipulation Details (indicate cue type and reason): Pt rolling side to side with Max A and second person performing toilet hygiene with Max A     Functional mobility during ADLs: Total assistance;+2 for physical assistance(with bari stedy) General ADL Comments: Pt requiring Max A for bathing, dressing, and toileting. Pt unaware that he had BM during bed mobility and sit<>stand.      Vision         Perception     Praxis      Pertinent Vitals/Pain Pain Assessment: Faces Faces Pain Scale: Hurts whole lot Pain Location: low back Pain Descriptors / Indicators: Sharp;Aching;Grimacing;Guarding Pain Intervention(s): Monitored during session;Limited activity within patient's tolerance;Repositioned     Hand Dominance Right   Extremity/Trunk Assessment Upper Extremity Assessment Upper Extremity Assessment: Generalized weakness   Lower Extremity Assessment Lower Extremity Assessment: Defer to PT evaluation RLE Deficits / Details: AAROM grossly WFL, strength hip flexion 2/5, knee extension 3-/5, ankle DF 3/5; sensation impaired to approx T9-10 RLE Sensation: decreased proprioception;decreased light touch LLE Deficits / Details: AAROM grossly WFL, strength hip flexion trace, knee extension 2/5, ankle DF 0/5; sensation impaired to approx T9-10 LLE Sensation: decreased proprioception;decreased light touch   Cervical / Trunk Assessment Cervical / Trunk Assessment: Other exceptions Cervical / Trunk Exceptions: limited trunk control at EOB   Communication Communication Communication: No difficulties   Cognition Arousal/Alertness: Awake/alert Behavior During Therapy: WFL for tasks assessed/performed Overall Cognitive Status: Within Functional Limits for tasks assessed                                 General Comments: Daughter (who is Therapist, sports at [employer] [facility]) was present throughout session   General Comments  daughter present and supportive  throughout session, hopeful for inpatient rehab; unable to take BP in sitting due to pt so reliant on UE support when up in stedy, but was diaphoretic and fatiguing fast, BP in supine 117/70    Exercises     Shoulder Instructions      Home Living Family/patient expects to be discharged to:: Private residence Living Arrangements: Non-relatives/Friends(girlfriend) Available Help at Discharge: Friend(s);Available 24 hours/day(girlfriend returns Apr20) Type of Home: House Home Access: Stairs to enter [employer] of Steps: 7(family states working on ramp) Entrance Stairs-Rails: Right;Left;Can reach both Home Layout: Two level;Able to live on main level with bedroom/bathroom Alternate Level Stairs-Number of Steps: 13 Alternate Level Stairs-Rails: Right     Bathroom Toilet: Standard Bathroom Accessibility: Yes   Home Equipment: Cane - single point;Walker - 2 wheels          Prior Functioning/Environment Level of Independence: Independent                 OT Problem List: Decreased strength;Decreased range of motion;Decreased activity tolerance;Impaired balance (sitting and/or standing);Decreased safety awareness;Decreased knowledge of use of DME or AE;Decreased knowledge of precautions;Pain      OT Treatment/Interventions: Self-care/ADL training;Therapeutic exercise;Energy conservation;DME and/or AE instruction;Therapeutic activities;Patient/family education    OT Goals(Current goals can be found in the care plan section) Acute Rehab OT Goals Patient Stated Goal: hopeful for rehab OT Goal Formulation: With patient Time For Goal Achievement: 04/12/18 Potential to Achieve Goals: Good ADL Goals Pt Will Perform Grooming: with set-up;with supervision;sitting(unsupported sitting) Pt Will Perform Upper Body Dressing: with min assist;sitting Pt Will Perform Lower Body Dressing: with mod assist;sit to/from stand;with adaptive equipment Pt Will Transfer to Toilet:  with  mod assist;stand pivot transfer;bedside commode;with +2 assist Additional ADL Goal #1: Pt will perform bed mobility with Min A +2 in prepration for ADLs  OT Frequency: Min 3X/week   Barriers to D/C:            Co-evaluation              AM-PAC PT "6 Clicks" Daily Activity     Outcome Measure Help from another person eating meals?: A Little Help from another person taking care of personal grooming?: A Little Help from another person toileting, which includes using toliet, bedpan, or urinal?: A Lot Help from another person bathing (including washing, rinsing, drying)?: A Lot Help from another person to put on and taking off regular upper body clothing?: A Lot Help from another person to put on and taking off regular lower body clothing?: A Lot 6 Click Score: 14   End of Session Equipment Utilized During Treatment: Back brace;Oxygen(TLSO) Nurse Communication: Mobility status;Patient requests pain meds;Other (comment)(Condom cath off)  Activity Tolerance: Patient tolerated treatment well Patient left: in bed;with call bell/phone within reach;with family/visitor present  OT Visit Diagnosis: Unsteadiness on feet (R26.81);Other abnormalities of gait and mobility (R26.89);Muscle weakness (generalized) (M62.81);Pain Pain - part of body: (Back)                Time: 0102-7253 OT Time Calculation (min): 44 min Charges:  OT General Charges $OT Visit: 1 Visit OT Evaluation $OT Eval Moderate Complexity: 1 Mod G-Codes:     Bertram MSOT, OTR/L Acute Rehab Pager: [phone] Office: [place] 03/29/2018, 5:41 PM

## 2018-03-29 NOTE — Progress Notes (Signed)
PHARMACY CONSULT NOTE FOR:  OUTPATIENT  PARENTERAL ANTIBIOTIC THERAPY (OPAT)  Indication: MSSA bacteremia, epidural abscess, discitis thoracic spine Regimen: Cefazolin 2g IV q8h End date: 05/22/2018   IV antibiotic discharge orders are pended. To discharging provider:  please sign these orders via discharge navigator,  Select New Orders & click on the button choice - Manage This Unsigned Work.     Elicia Lamp, PharmD, BCPS Clinical Pharmacist 03/29/2018 4:02 PM

## 2018-03-29 NOTE — Progress Notes (Signed)
[facility]                                                                              Patient Demographics  Cody Velez, is a 72 y.o. male, DOB - June 26, 1946, LDJ:[id number]  Admit date - 03/15/2018   Admitting Physician Pratik Darleen Crocker, DO  Outpatient Primary MD for the patient is Asencion Noble, MD  Outpatient specialists:   LOS - 13  days   Medical records reviewed and are as summarized below:    Chief Complaint  Patient presents with  . Weakness       Brief summary   Patient is a 72 year old male with history of chronic back pain, recent spinal cord stimulator placement several weeks prior to admission, nephrolithiasis, recent admission for UTI, hypertension, dyslipidemia, obesity, CAD presented to ED with generalized weakness that led to a fall at home.He actually saw urology on the day of admission as a follow-up appointment after recent admission to [facility] in [place] where he was noted to have some bilateral nephrolithiasis. He was apparently prescribed some doxycycline during his urology visit, likely due to suspicion of urinary tract infection. He had progressive confusion throughout the day and was noted to be hypoxemic upon arrival as well as hypoxemic during his urology visit earlier. Patient continued to complain of lower back pain, lower extremity weakness, neurosurgery was consulted.  Patient underwent laminectomy T10-T11 decompression of epidural abscess, debridement of discitis T10-11 and segmental fixation from T8 through L3.   Assessment & Plan    Principal Problem:  Osteomyelitis T10-T11 with epidural abscess -Status post laminectomy T10- T11 decompression of epidural abscess, debridement of discitis T10-11 and segmental fixation from T8 through L3 -ESR 94, CRP 17 on 4/2.  ID following, recommended IV cefazolin for 6-8 weeks -Follow intraoperative cultures, management per neurosurgery, pain control -Start PT    Bacteremia due to methicillin  susceptible Staphylococcus aureus (MSSA) -ID following, recommended continue Ancef for MSSA bacteremia  Hypokalemia Resolved  Chronic back pain status post stimulator -Neurosurgery following, no acute issues  Obesity -BMI 35, recommended diet and weight control  GERD -Continue PPI  Constipation - Continue stool softeners   Code Status: Full CODE STATUS DVT Prophylaxis: Full Family Communication: Discussed in detail with the patient, all imaging results, lab results explained to the patient and daughter at the bedside   Disposition Plan: Daughter requesting CIR, PT evaluation pending  Time Spent in minutes 30 minutes  Procedures:    Consultants:   Neurosurgery ID  Antimicrobials:      Medications  Scheduled Meds: . amLODipine  10 mg Oral Daily  . aspirin EC  81 mg Oral Daily  . chlorhexidine  15 mL Mouth Rinse BID  . cycloSPORINE  1 drop Both Eyes BID  . docusate sodium  100 mg Oral BID  . donepezil  10 mg Oral QHS  . feeding supplement (ENSURE ENLIVE)  237 mL Oral BID BM  . finasteride  5 mg Oral Daily  . gabapentin  600 mg Oral TID  . ipratropium-albuterol  3 mL Nebulization BID  . latanoprost  1 drop Both Eyes QHS  . mouth rinse  15 mL Mouth Rinse q12n4p  . memantine  10 mg Oral BID  . pantoprazole  40 mg Oral Daily  . potassium chloride  20 mEq Oral BID  . pravastatin  10 mg Oral QPM  . saccharomyces boulardii  250 mg Oral BID WC  . senna  1 tablet Oral BID  . sodium chloride flush  10-40 mL Intracatheter Q12H  . sodium chloride flush  3 mL Intravenous Q12H  . traZODone  100 mg Oral QHS  . venlafaxine XR  150 mg Oral BID  . vitamin B-12  1,000 mcg Oral Daily   Continuous Infusions: . sodium chloride    .  ceFAZolin (ANCEF) IV Stopped (03/29/18 0933)  . lactated ringers 125 mL/hr at 03/29/18 1311  . methocarbamol (ROBAXIN)  IV     PRN Meds:.acetaminophen **OR** acetaminophen, albuterol, alum & mag hydroxide-simeth, bisacodyl, bisacodyl,  fluticasone, hydrALAZINE, HYDROmorphone (DILAUDID) injection, menthol-cetylpyridinium **OR** phenol, methocarbamol **OR** methocarbamol (ROBAXIN)  IV, morphine injection, naLOXone (NARCAN)  injection, ondansetron **OR** ondansetron (ZOFRAN) IV, oxyCODONE, oxyCODONE, oxyCODONE-acetaminophen, polyethylene glycol, sodium chloride flush, sodium chloride flush, sodium phosphate   Antibiotics   Anti-infectives (From admission, onward)   Start     Dose/Rate Route Frequency Ordered Stop   03/28/18 1530  ceFAZolin (ANCEF) IVPB 2g/100 mL premix  Status:  Discontinued     2 g 200 mL/hr over 30 Minutes Intravenous Every 8 hours 03/28/18 1516 03/28/18 1537   03/28/18 0710  bacitracin 50,000 Units in sodium chloride 0.9 % 500 mL irrigation  Status:  Discontinued       As needed 03/28/18 0851 03/28/18 1408   03/18/18 1800  ceFAZolin (ANCEF) IVPB 2g/100 mL premix     2 g 200 mL/hr over 30 Minutes Intravenous Every 8 hours 03/18/18 1649     03/17/18 0400  vancomycin (VANCOCIN) 1,500 mg in sodium chloride 0.9 % 500 mL IVPB  Status:  Discontinued     1,500 mg 250 mL/hr over 120 Minutes Intravenous Every 24 hours 03/16/18 1113 03/16/18 1120   03/17/18 0000  vancomycin (VANCOCIN) 1,500 mg in sodium chloride 0.9 % 500 mL IVPB  Status:  Discontinued     1,500 mg 250 mL/hr over 120 Minutes Intravenous Every 24 hours 03/16/18 1120 03/18/18 1553   03/16/18 0500  piperacillin-tazobactam (ZOSYN) IVPB 3.375 g  Status:  Discontinued     3.375 g 12.5 mL/hr over 240 Minutes Intravenous Every 8 hours 03/16/18 0127 03/18/18 1553   03/16/18 0130  vancomycin (VANCOCIN) IVPB 1000 mg/200 mL premix     1,000 mg 200 mL/hr over 60 Minutes Intravenous Every 1 hr x 2 03/16/18 0126 03/16/18 0602   03/15/18 2030  piperacillin-tazobactam (ZOSYN) IVPB 3.375 g     3.375 g 100 mL/hr over 30 Minutes Intravenous  Once 03/15/18 2017 03/15/18 2118        Subjective:   Cody Velez was seen and examined today.  Feels better  today, pain somewhat controlled. Patient denies dizziness, chest pain, shortness of breath, abdominal pain, N/V/D/C, new weakness, numbess, tingling. No acute events overnight.    Objective:   Vitals:   03/29/18 1000 03/29/18 1100 03/29/18 1200 03/29/18 1300  BP: (!) 110/54 121/78 117/76 (!) 130/94  Pulse: 93 95 94 89  Resp: '10 17 14 12  ' Temp:   98.5 F (36.9 C)   TempSrc:   Oral   SpO2: 95% 96% 98% 95%  Weight:      Height:        Intake/Output Summary (Last  24 hours) at 03/29/2018 1359 Last data filed at 03/29/2018 1300 Gross per 24 hour  Intake 3913.92 ml  Output 2455 ml  Net 1458.92 ml     Wt Readings from Last 3 Encounters:  03/27/18 130.4 kg (287 lb 7.7 oz)  11/17/17 (!) 146.5 kg (323 lb)  10/20/17 (!) 147 kg (324 lb)     Exam  General: Alert and oriented x 3, NAD  Eyes: PERRLA, EOMI, Anicteric Sclera,  HEENT:    Cardiovascular: S1 S2 auscultated,  Regular rate and rhythm.  Respiratory: Clear to auscultation bilaterally, no wheezing, rales or rhonchi  Gastrointestinal: Soft, nontender, nondistended, + bowel sounds  Ext: no pedal edema bilaterally  Neuro: AAOx3, Cr N's II- XII. Strength 5/5 upper and lower extremities bilaterally,  Musculoskeletal: No digital cyanosis, clubbing  Skin: No rashes  Psych: Normal affect and demeanor, alert and oriented x3    Data Reviewed:  I have personally reviewed following labs and imaging studies  Micro Results Recent Results (from the past 240 hour(s))  C difficile quick scan w PCR reflex     Status: None   Collection Time: 03/19/18  5:04 PM  Result Value Ref Range Status   C Diff antigen NEGATIVE NEGATIVE Final   C Diff toxin NEGATIVE NEGATIVE Final   C Diff interpretation No C. difficile detected.  Final    Comment: VALID Performed at [facility], [address] [place], [place] [address]   Gastrointestinal Panel by PCR , Stool     Status: None   Collection Time: 03/19/18  5:04 PM  Result Value Ref  Range Status   Campylobacter species NOT DETECTED NOT DETECTED Final   Plesimonas shigelloides NOT DETECTED NOT DETECTED Final   Salmonella species NOT DETECTED NOT DETECTED Final   Yersinia enterocolitica NOT DETECTED NOT DETECTED Final   Vibrio species NOT DETECTED NOT DETECTED Final   Vibrio cholerae NOT DETECTED NOT DETECTED Final   Enteroaggregative E coli (EAEC) NOT DETECTED NOT DETECTED Final   Enteropathogenic E coli (EPEC) NOT DETECTED NOT DETECTED Final   Enterotoxigenic E coli (ETEC) NOT DETECTED NOT DETECTED Final   Shiga like toxin producing E coli (STEC) NOT DETECTED NOT DETECTED Final   Shigella/Enteroinvasive E coli (EIEC) NOT DETECTED NOT DETECTED Final   Cryptosporidium NOT DETECTED NOT DETECTED Final   Cyclospora cayetanensis NOT DETECTED NOT DETECTED Final   Entamoeba histolytica NOT DETECTED NOT DETECTED Final   Giardia lamblia NOT DETECTED NOT DETECTED Final   Adenovirus F40/41 NOT DETECTED NOT DETECTED Final   Astrovirus NOT DETECTED NOT DETECTED Final   Norovirus GI/GII NOT DETECTED NOT DETECTED Final   Rotavirus A NOT DETECTED NOT DETECTED Final   Sapovirus (I, II, IV, and V) NOT DETECTED NOT DETECTED Final    Comment: Performed at [facility], [address] [place], [place] [address]  Surgical pcr screen     Status: None   Collection Time: 03/27/18 11:04 PM  Result Value Ref Range Status   MRSA, PCR NEGATIVE NEGATIVE Final   Staphylococcus aureus NEGATIVE NEGATIVE Final    Comment: (NOTE) The Xpert SA Assay (FDA approved for NASAL specimens in patients 36 years of age and older), is one component of a comprehensive surveillance program. It is not intended to diagnose infection nor to guide or monitor treatment. Performed at [facility], [place] [address] [place], [place] [address]   Aerobic/Anaerobic Culture (surgical/deep wound)     Status: None (Preliminary result)   Collection Time: 03/28/18  9:51 AM  Result Value Ref Range Status    Specimen Description WOUND  Final   Special Requests DISCS T10 T11  Final   Gram Stain   Final    RARE WBC PRESENT,BOTH PMN AND MONONUCLEAR NO ORGANISMS SEEN    Culture   Final    NO GROWTH < 24 HOURS Performed at [facility], [address], [place], [place] [address]    Report Status PENDING  Incomplete    Radiology Reports Dg Thoracolumabar Spine  Result Date: 03/28/2018 CLINICAL DATA:  T8 through L2 fixation. FLUOROSCOPY TIME:  1 minutes 8 seconds. EXAM: THORACOLUMBAR SPINE 1V COMPARISON:  CT scan of March 20, 2018. FINDINGS: Status post surgical posterior fusion extending from T8 to L2 with bilateral intrapedicular screw placement. Good alignment of vertebral bodies is noted. IMPRESSION: Status post surgical posterior fusion of thoracolumbar junction as described above. Electronically Signed   By: Marijo Conception, M.D.   On: 03/28/2018 13:32   Dg Abd 1 View  Result Date: 03/19/2018 CLINICAL DATA:  Abdominal distension. EXAM: ABDOMEN - 1 VIEW COMPARISON:  None. FINDINGS: Nonspecific gaseous distension of the colon. There is no bowel dilatation to suggest obstruction. There is no evidence of pneumoperitoneum, portal venous gas or pneumatosis. There are no pathologic calcifications along the expected course of the ureters. There is posterior lumbar interbody fusion from L1 through L3. There is interbody fusion at L3-4 and L4-5. IMPRESSION: Negative. Electronically Signed   By: Kathreen Devoid   On: 03/19/2018 16:39   Ct Head Wo Contrast  Result Date: 03/19/2018 CLINICAL DATA:  Altered level of consciousness. Fall on 03/11/2018 with increasing confusion. EXAM: CT HEAD WITHOUT CONTRAST TECHNIQUE: Contiguous axial images were obtained from the base of the skull through the vertex without intravenous contrast. COMPARISON:  Outside brain MRI 05/02/2015 FINDINGS: Brain: There is no evidence of acute large territory infarct, intracranial hemorrhage, mass, midline shift, or extra-axial fluid  collection. Mild lateral, third, and fourth ventriculomegaly is unchanged and may reflect cerebral atrophy or normal pressure hydrocephalus in the appropriate clinical setting. The right lateral ventricle remains slightly asymmetrically dilated compared to the left, most notably in the temporal horn. Patchy to confluent hypodensities throughout the cerebral white matter correspond to extensive T2 hyperintensities on MRI and are nonspecific but compatible with chronic small vessel ischemic disease. There is mild patchy hypoattenuation in the pons. Vascular: Mild calcified atherosclerosis at the skull base. No hyperdense vessel. Skull: No fracture or focal osseous lesion. Sinuses/Orbits: Prior functional sinus surgery. No acute sinus findings. Clear mastoid air cells. Unremarkable orbits. Other: None. IMPRESSION: 1. No evidence of acute intracranial abnormality. 2. Extensive chronic small vessel ischemic disease. 3. Unchanged mild chronic ventriculomegaly as above. Electronically Signed   By: Logan Bores M.D.   On: 03/19/2018 15:38   Ct Angio Chest Pe W And/or Wo Contrast  Result Date: 03/15/2018 CLINICAL DATA:  72 year old male with acute generalized abdominal pain and fever. EXAM: CT ANGIOGRAPHY CHEST CT ABDOMEN AND PELVIS WITH CONTRAST TECHNIQUE: Multidetector CT imaging of the chest was performed using the standard protocol during bolus administration of intravenous contrast. Multiplanar CT image reconstructions and MIPs were obtained to evaluate the vascular anatomy. Multidetector CT imaging of the abdomen and pelvis was performed using the standard protocol during bolus administration of intravenous contrast. CONTRAST:  12m ISOVUE-370 IOPAMIDOL (ISOVUE-370) INJECTION 76% COMPARISON:  Abdominal CT dated 03/15/2018 FINDINGS: CTA CHEST FINDINGS Cardiovascular: Top-normal cardiac size. No pericardial effusion. Mild atherosclerotic calcification of the thoracic  aorta. No aneurysmal dilatation or evidence of  dissection. The origins of the great vessels of the aortic arch are patent. Evaluation of the pulmonary arteries is limited due to suboptimal opacification and respiratory motion artifact. No large or central pulmonary artery embolus identified. Mediastinum/Nodes: There is no hilar or mediastinal adenopathy. Esophagus and the thyroid gland are grossly unremarkable. No mediastinal fluid collection. Lungs/Pleura: Bibasilar linear and streaky densities, likely atelectasis/scarring. There is a 4 mm left lower lobe nodule (series 6, image 87). There is no focal consolidation, pleural effusion, or pneumothorax. The central airways are patent. Musculoskeletal: There is degenerative changes of the spine. No acute fracture. Stimulator device extending into the upper thoracic spine. Review of the MIP images confirms the above findings. CT ABDOMEN and PELVIS FINDINGS No intra-abdominal free air or free fluid. Hepatobiliary: Apparent mild fatty infiltration of the liver. No intrahepatic biliary ductal dilatation. The gallbladder is unremarkable. Pancreas: Unremarkable. No pancreatic ductal dilatation or surrounding inflammatory changes. Spleen: Normal in size without focal abnormality. Adrenals/Urinary Tract: The adrenal glands are unremarkable. Small nonobstructing bilateral renal calculi measure up to 5 mm in the upper pole of the left kidney. There is no hydronephrosis on either side. A 1 cm hypodense lesion from the superior pole of the right kidney demonstrates fluid attenuation most consistent with a cyst. The visualized ureters and urinary bladder appear unremarkable. Stomach/Bowel: Small hiatal hernia. There is no bowel obstruction or active inflammation. Air is noted throughout the colon. The appendix is normal. Vascular/Lymphatic: Mild aortoiliac atherosclerotic disease. No portal venous gas. There is no adenopathy. Reproductive: The prostate and seminal vesicles are grossly unremarkable. Other: Small fat containing  bilateral inguinal and umbilical hernias. No fluid collection or inflammatory changes. Musculoskeletal: There is osteopenia with multilevel degenerative changes. L1-L3 posterior fusion hardware and L4-L5 and L5-S1 interbody disc spacer. No acute osseous pathology. Review of the MIP images confirms the above findings. IMPRESSION: 1. No acute intrathoracic, abdominal, or pelvic pathology. 2. Mild fatty liver. 3. Small nonobstructing bilateral renal calculi.  No hydronephrosis. 4. Mild air distention of the colon. No bowel obstruction or active inflammation. Normal appendix. 5.  Aortic Atherosclerosis (ICD10-I70.0). 6. A 4 mm left lower lobe pulmonary nodule. No follow-up needed if patient is low-risk. Non-contrast chest CT can be considered in 12 months if patient is high-risk. This recommendation follows the consensus statement: Guidelines for Management of Incidental Pulmonary Nodules Detected on CT Images: From the Fleischner Society 2017; Radiology 2017; [phone]. 7. Electronically Signed   By: Anner Crete M.D.   On: 03/15/2018 22:25   Ct Cervical Spine W Contrast  Result Date: 03/23/2018 CLINICAL DATA:  Sepsis, pneumonia. Evaluate for possible spinal source of infection or epidural abscess. EXAM: CT CERVICAL SPINE WITH CONTRAST TECHNIQUE: Multidetector CT imaging of the cervical spine was performed during intravenous contrast administration. Multiplanar CT image reconstructions were also generated. CONTRAST:  42m ISOVUE-300 IOPAMIDOL (ISOVUE-300) INJECTION 61% COMPARISON:  CT of the thoracic and lumbar spine 03/20/2018. FINDINGS: Alignment: Straightening of the normal cervical lordosis status post C3-C6 ACDF. 2 mm anterolisthesis C2-3 is physiologic/facet mediated. Skull base and vertebrae: No acute fracture. No primary bone lesion or focal pathologic process. Soft tissues and spinal canal: Dorsal column stimulator has been placed opposite the C4-T1 vertebral segments. This device, in combination with  artifact from plate and screw fixation precludes definitive evaluation of the spinal canal. Presence or absence of an epidural/subdural fluid collection or abscess cannot be confirmed or excluded. No paravertebral inflammatory process is identified. There are  no findings strongly suggestive of osteomyelitis. Disc levels: C2-3: Uncinate spurring and LEFT greater than RIGHT facet arthropathy results in LEFT C3 foraminal narrowing. C3-4:  Post fusion interspace.  Foraminal narrowing on the RIGHT. C4-5:  Post fusion interspace.  Foraminal narrowing on the RIGHT. C5-6:  Post fusion interspace.  Unremarkable. C6-7:  Disc space narrowing.  No definite impingement. C7-T1:  Facet arthropathy.  No definite impingement. Upper chest: Not assessed. Other: None. IMPRESSION: Dorsal column stimulator placement opposite the C4-T1 vertebral segments, in conjunction with artifact from plate and screw fixation due to C3-C6 ACDF precludes definitive evaluation of the spinal canal. Presence or absence of an epidural/subdural fluid collection or abscess cannot be confirmed or excluded. Consider removal of the dorsal column stimulator, followed by MRI of the cervical spine without and with contrast, if strong suspicion of intraspinal fluid collection exists, as the parameters of the current spinal cord stimulator do not allow cervical MR imaging. Advanced spondylosis. Bony overgrowth contributes to multilevel foraminal narrowing as described. No paravertebral inflammatory process or changes of osteomyelitis are observed. Electronically Signed   By: Staci Righter M.D.   On: 03/23/2018 07:52   Ct Thoracic Spine W Contrast  Addendum Date: 03/22/2018   ADDENDUM REPORT: 03/22/2018 13:30 ADDENDUM: Correction to report as follows: EXAM: CT THORACIC AND LUMBAR SPINE WITH CONTRAST TECHNIQUE: Multidetector CT imaging of the thoracic and lumbar spine was performed with contrast. Multiplanar CT image reconstructions were also generated. CONTRAST:   100 cc Isovue 300 Electronically Signed   By: Kristine Garbe M.D.   On: 03/22/2018 13:30   Result Date: 03/22/2018 CLINICAL DATA:  72 y/o M; 72 y/o M; mid and lower back pain with new leg weakness. Fall at home 03/15/2018. MSSA bacteremia. EXAM: CT THORACIC AND LUMBAR SPINE WITHOUT CONTRAST TECHNIQUE: Multidetector CT imaging of the thoracic and lumbar spine was performed without contrast. Multiplanar CT image reconstructions were also generated. COMPARISON:  03/15/2018 CT of chest, abdomen, and pelvis. FINDINGS: CT THORACIC SPINE FINDINGS Alignment: Normal. Vertebrae: No acute fracture or focal pathologic process. Multiple endplate Schmorl's nodes are stable from prior CT. No new loss of vertebral body height. Partially visualized anterior cervical discectomy and fusion. Paraspinal and other soft tissues: Spinal stimulator extending to the cervical spinal canal and above the field of view. Small bilateral pleural effusions. Paravertebral edema is present surrounding T10 and T11 vertebral bodies which may represent underlying occult fracture, bone contusion, possibly infection Disc levels: L2-3 loss of disc space height and endplate degenerative changes. T10-L1 hypertrophic facet arthropathy. CT LUMBAR SPINE FINDINGS Segmentation: 5 lumbar type vertebrae. Alignment: Normal. Vertebrae: L1-L3 posterior instrumented fusion and interbody fusion with laminectomy. L4-S1 interbody fusion. No new loss of vertebral body height or malalignment. Hardware is intact without apparent hardware related complication or periprosthetic lucency. Paraspinal and other soft tissues: Aortic atherosclerosis. Disc levels: Endplate marginal osteophytes and facet hypertrophy results in mild bony foraminal stenosis at the left L2-3 and L3-4 levels and moderate bony foraminal stenosis at L5-S1. On the right there is mild bony foraminal stenosis at L1-2, L2-3, and L5-S1. No high-grade bony canal stenosis. IMPRESSION: No acute fracture,  malalignment, or bony erosive changes identified. However, paravertebral edema is present surrounding T10 and T11 vertebral bodies which may represent underlying occult fracture, bone contusion, or possibly infection. Consider thoracic spine MRI with and without contrast for further evaluation. These results will be called to the ordering clinician or representative by the Radiologist Assistant, and communication documented in the PACS or zVision Dashboard.  Electronically Signed: By: Kristine Garbe M.D. On: 03/20/2018 20:22   Ct Lumbar Spine W Contrast  Addendum Date: 03/22/2018   ADDENDUM REPORT: 03/22/2018 13:30 ADDENDUM: Correction to report as follows: EXAM: CT THORACIC AND LUMBAR SPINE WITH CONTRAST TECHNIQUE: Multidetector CT imaging of the thoracic and lumbar spine was performed with contrast. Multiplanar CT image reconstructions were also generated. CONTRAST:  100 cc Isovue 300 Electronically Signed   By: Kristine Garbe M.D.   On: 03/22/2018 13:30   Result Date: 03/22/2018 CLINICAL DATA:  72 y/o M; 72 y/o M; mid and lower back pain with new leg weakness. Fall at home 03/15/2018. MSSA bacteremia. EXAM: CT THORACIC AND LUMBAR SPINE WITHOUT CONTRAST TECHNIQUE: Multidetector CT imaging of the thoracic and lumbar spine was performed without contrast. Multiplanar CT image reconstructions were also generated. COMPARISON:  03/15/2018 CT of chest, abdomen, and pelvis. FINDINGS: CT THORACIC SPINE FINDINGS Alignment: Normal. Vertebrae: No acute fracture or focal pathologic process. Multiple endplate Schmorl's nodes are stable from prior CT. No new loss of vertebral body height. Partially visualized anterior cervical discectomy and fusion. Paraspinal and other soft tissues: Spinal stimulator extending to the cervical spinal canal and above the field of view. Small bilateral pleural effusions. Paravertebral edema is present surrounding T10 and T11 vertebral bodies which may represent underlying  occult fracture, bone contusion, possibly infection Disc levels: L2-3 loss of disc space height and endplate degenerative changes. T10-L1 hypertrophic facet arthropathy. CT LUMBAR SPINE FINDINGS Segmentation: 5 lumbar type vertebrae. Alignment: Normal. Vertebrae: L1-L3 posterior instrumented fusion and interbody fusion with laminectomy. L4-S1 interbody fusion. No new loss of vertebral body height or malalignment. Hardware is intact without apparent hardware related complication or periprosthetic lucency. Paraspinal and other soft tissues: Aortic atherosclerosis. Disc levels: Endplate marginal osteophytes and facet hypertrophy results in mild bony foraminal stenosis at the left L2-3 and L3-4 levels and moderate bony foraminal stenosis at L5-S1. On the right there is mild bony foraminal stenosis at L1-2, L2-3, and L5-S1. No high-grade bony canal stenosis. IMPRESSION: No acute fracture, malalignment, or bony erosive changes identified. However, paravertebral edema is present surrounding T10 and T11 vertebral bodies which may represent underlying occult fracture, bone contusion, or possibly infection. Consider thoracic spine MRI with and without contrast for further evaluation. These results will be called to the ordering clinician or representative by the Radiologist Assistant, and communication documented in the PACS or zVision Dashboard. Electronically Signed: By: Kristine Garbe M.D. On: 03/20/2018 20:22   Ct Abdomen Pelvis W Contrast  Result Date: 03/15/2018 CLINICAL DATA:  72 year old male with acute generalized abdominal pain and fever. EXAM: CT ANGIOGRAPHY CHEST CT ABDOMEN AND PELVIS WITH CONTRAST TECHNIQUE: Multidetector CT imaging of the chest was performed using the standard protocol during bolus administration of intravenous contrast. Multiplanar CT image reconstructions and MIPs were obtained to evaluate the vascular anatomy. Multidetector CT imaging of the abdomen and pelvis was performed  using the standard protocol during bolus administration of intravenous contrast. CONTRAST:  83m ISOVUE-370 IOPAMIDOL (ISOVUE-370) INJECTION 76% COMPARISON:  Abdominal CT dated 03/15/2018 FINDINGS: CTA CHEST FINDINGS Cardiovascular: Top-normal cardiac size. No pericardial effusion. Mild atherosclerotic calcification of the thoracic aorta. No aneurysmal dilatation or evidence of dissection. The origins of the great vessels of the aortic arch are patent. Evaluation of the pulmonary arteries is limited due to suboptimal opacification and respiratory motion artifact. No large or central pulmonary artery embolus identified. Mediastinum/Nodes: There is no hilar or mediastinal adenopathy. Esophagus and the thyroid gland are grossly unremarkable. No mediastinal  fluid collection. Lungs/Pleura: Bibasilar linear and streaky densities, likely atelectasis/scarring. There is a 4 mm left lower lobe nodule (series 6, image 87). There is no focal consolidation, pleural effusion, or pneumothorax. The central airways are patent. Musculoskeletal: There is degenerative changes of the spine. No acute fracture. Stimulator device extending into the upper thoracic spine. Review of the MIP images confirms the above findings. CT ABDOMEN and PELVIS FINDINGS No intra-abdominal free air or free fluid. Hepatobiliary: Apparent mild fatty infiltration of the liver. No intrahepatic biliary ductal dilatation. The gallbladder is unremarkable. Pancreas: Unremarkable. No pancreatic ductal dilatation or surrounding inflammatory changes. Spleen: Normal in size without focal abnormality. Adrenals/Urinary Tract: The adrenal glands are unremarkable. Small nonobstructing bilateral renal calculi measure up to 5 mm in the upper pole of the left kidney. There is no hydronephrosis on either side. A 1 cm hypodense lesion from the superior pole of the right kidney demonstrates fluid attenuation most consistent with a cyst. The visualized ureters and urinary bladder  appear unremarkable. Stomach/Bowel: Small hiatal hernia. There is no bowel obstruction or active inflammation. Air is noted throughout the colon. The appendix is normal. Vascular/Lymphatic: Mild aortoiliac atherosclerotic disease. No portal venous gas. There is no adenopathy. Reproductive: The prostate and seminal vesicles are grossly unremarkable. Other: Small fat containing bilateral inguinal and umbilical hernias. No fluid collection or inflammatory changes. Musculoskeletal: There is osteopenia with multilevel degenerative changes. L1-L3 posterior fusion hardware and L4-L5 and L5-S1 interbody disc spacer. No acute osseous pathology. Review of the MIP images confirms the above findings. IMPRESSION: 1. No acute intrathoracic, abdominal, or pelvic pathology. 2. Mild fatty liver. 3. Small nonobstructing bilateral renal calculi.  No hydronephrosis. 4. Mild air distention of the colon. No bowel obstruction or active inflammation. Normal appendix. 5.  Aortic Atherosclerosis (ICD10-I70.0). 6. A 4 mm left lower lobe pulmonary nodule. No follow-up needed if patient is low-risk. Non-contrast chest CT can be considered in 12 months if patient is high-risk. This recommendation follows the consensus statement: Guidelines for Management of Incidental Pulmonary Nodules Detected on CT Images: From the Fleischner Society 2017; Radiology 2017; [id number]. 7. Electronically Signed   By: Anner Crete M.D.   On: 03/15/2018 22:25   Dg Lumbar Spine 1 View  Result Date: 03/28/2018 CLINICAL DATA:  T8 through L2 fusion EXAM: LUMBAR SPINE - 1 VIEW COMPARISON:  None. FINDINGS: Single cross-table lateral x-ray of the lumbar spine is provided. Posterior tissue retractors are noted. Posterior lumbar interbody fusion from L1 through L3. Interbody fusion at L3-4. Interbody cages at L4-5 and L5-S1. IMPRESSION: Intraoperative localization as described above. Electronically Signed   By: Kathreen Devoid   On: 03/28/2018 09:37   Dg Chest Port 1  View  Result Date: 03/17/2018 CLINICAL DATA:  Respiratory distress.  History of prostate carcinoma EXAM: PORTABLE CHEST 1 VIEW COMPARISON:  Chest radiograph and chest CT March 15, 2018 FINDINGS: There is new airspace consolidation in the medial right base, suspicious for pneumonia. The lungs elsewhere are clear. There is cardiomegaly with pulmonary venous hypertension. There is a small right pleural effusion. No adenopathy. There is a stimulator with lead tips at the cervical-thoracic junction. There is postoperative change in the lower cervical spine. IMPRESSION: Medial right base consolidation felt to represent pneumonia. Underlying pulmonary vascular congestion. Small right pleural effusion. Electronically Signed   By: Lowella Grip III M.D.   On: 03/17/2018 09:48   Dg Chest Portable 1 View  Result Date: 03/15/2018 CLINICAL DATA:  72 year old male status post fall getting into  car today. Confusion beginning about 1630 hours. Shortness of breath. EXAM: PORTABLE CHEST 1 VIEW COMPARISON:  Portable chest 05/04/2016 and earlier. FINDINGS: Portable AP upright view at 1854 hours. Chronic cardiomegaly. Stable mediastinal contours. Visualized tracheal air column is within normal limits. Mild respiratory motion artifact. Suggestion of increased pulmonary vascularity compared to 2017. No pneumothorax, pleural effusion or consolidation is evident. Prior cervical spine surgery with spinal stimulator device coursing to the cervical region. No acute osseous abnormality identified. Negative visible bowel gas pattern. IMPRESSION: Cardiomegaly with appearance of increased pulmonary vascularity from prior studies. Consider acute pulmonary edema. Electronically Signed   By: Genevie Ann M.D.   On: 03/15/2018 19:09   Dg C-arm 1-60 Min  Result Date: 03/28/2018 CLINICAL DATA:  T8 through L2 fixation. FLUOROSCOPY TIME:  1 minutes 8 seconds. EXAM: THORACOLUMBAR SPINE 1V COMPARISON:  CT scan of March 20, 2018. FINDINGS: Status post  surgical posterior fusion extending from T8 to L2 with bilateral intrapedicular screw placement. Good alignment of vertebral bodies is noted. IMPRESSION: Status post surgical posterior fusion of thoracolumbar junction as described above. Electronically Signed   By: Marijo Conception, M.D.   On: 03/28/2018 13:32   Dg C-arm 1-60 Min  Result Date: 03/28/2018 CLINICAL DATA:  T8 through L2 fixation. FLUOROSCOPY TIME:  1 minutes 8 seconds. EXAM: THORACOLUMBAR SPINE 1V COMPARISON:  CT scan of March 20, 2018. FINDINGS: Status post surgical posterior fusion extending from T8 to L2 with bilateral intrapedicular screw placement. Good alignment of vertebral bodies is noted. IMPRESSION: Status post surgical posterior fusion of thoracolumbar junction as described above. Electronically Signed   By: Marijo Conception, M.D.   On: 03/28/2018 13:32   [place] Ekg Site Rite  Result Date: 03/23/2018 If Site Rite image not attached, placement could not be confirmed due to current cardiac rhythm.  [place] Ekg Site Rite  Result Date: 03/22/2018 If Site Rite image not attached, placement could not be confirmed due to current cardiac rhythm.   Lab Data:  CBC: Recent Labs  Lab 03/24/18 1007 03/26/18 1645 03/27/18 0455 03/28/18 1723 03/29/18 0439  WBC 11.6* 9.6 10.1 15.9* 12.8*  HGB 10.7* 11.2* 11.3* 9.7* 8.9*  HCT 34.2* 35.2* 36.4* 31.2* 29.6*  MCV 84.2 84.8 84.8 85.7 85.8  PLT 645* 732* 746* 722* 416*   Basic Metabolic Panel: Recent Labs  Lab 03/24/18 0423 03/26/18 1645 03/27/18 0455 03/28/18 1723 03/29/18 0439  NA 133* 133* 132* 133* 131*  K 4.3 4.7 4.8 5.7* 4.7  CL 99* 99* 97* 100* 97*  CO2 '26 26 25 24 25  ' GLUCOSE 87 105* 95 138* 117*  BUN '11 15 14 19 19  ' CREATININE 0.86 0.97 0.96 1.09 1.03  CALCIUM 8.5* 8.9 9.0 8.9 8.7*   GFR: Estimated Creatinine Clearance: 95.7 mL/min (by C-G formula based on SCr of 1.03 mg/dL). Liver Function Tests: Recent Labs  Lab 03/26/18 1645 03/27/18 0455 03/28/18 1723  03/29/18 0439  AST 53* 47* 44* 43*  ALT 36 34 20 17  ALKPHOS 105 109 87 83  BILITOT 0.3 0.4 0.5 0.4  PROT 7.8 7.8 7.4 6.7  ALBUMIN 2.0* 2.1* 2.6* 2.3*   No results for input(s): LIPASE, AMYLASE in the last 168 hours. No results for input(s): AMMONIA in the last 168 hours. Coagulation Profile: No results for input(s): INR, PROTIME in the last 168 hours. Cardiac Enzymes: No results for input(s): CKTOTAL, CKMB, CKMBINDEX, TROPONINI in the last 168 hours. BNP (last 3 results) No results for input(s): PROBNP in the  last 8760 hours. HbA1C: No results for input(s): HGBA1C in the last 72 hours. CBG: No results for input(s): GLUCAP in the last 168 hours. Lipid Profile: No results for input(s): CHOL, HDL, LDLCALC, TRIG, CHOLHDL, LDLDIRECT in the last 72 hours. Thyroid Function Tests: No results for input(s): TSH, T4TOTAL, FREET4, T3FREE, THYROIDAB in the last 72 hours. Anemia Panel: No results for input(s): VITAMINB12, FOLATE, FERRITIN, TIBC, IRON, RETICCTPCT in the last 72 hours. Urine analysis:    Component Value Date/Time   COLORURINE AMBER (A) 03/15/2018 1830   APPEARANCEUR CLOUDY (A) 03/15/2018 1830   LABSPEC 1.029 03/15/2018 1830   PHURINE 5.0 03/15/2018 1830   GLUCOSEU 50 (A) 03/15/2018 1830   HGBUR MODERATE (A) 03/15/2018 1830   BILIRUBINUR MODERATE (A) 03/15/2018 1830   KETONESUR NEGATIVE 03/15/2018 1830   PROTEINUR 100 (A) 03/15/2018 1830   UROBILINOGEN 0.2 03/05/2011 1909   NITRITE NEGATIVE 03/15/2018 1830   LEUKOCYTESUR NEGATIVE 03/15/2018 1830     Raquon Milledge M.D. [facility] 03/29/2018, 1:59 PM  Pager: [phone] Between 7am to 7pm - call Pager - [phone]  After 7pm go to [email or web address] - password [email or web address]  Call night coverage person covering after 7pm

## 2018-03-29 NOTE — Progress Notes (Signed)
Orthopedic Tech Progress Note Patient Details:  Cody Velez 10-01-1946 [id number]  Patient ID: [name], male   DOB: 1946-05-29, 72 y.o.   MRN: [id number]   [name] 03/29/2018, 10:00 AM Called in bio-tech brace order; spoke with [facility]

## 2018-03-29 NOTE — Progress Notes (Signed)
Subjective:  Pt sp Neurosurgery  Antibiotics:  Anti-infectives (From admission, onward)   Start     Dose/Rate Route Frequency Ordered Stop   03/28/18 1530  ceFAZolin (ANCEF) IVPB 2g/100 mL premix  Status:  Discontinued     2 g 200 mL/hr over 30 Minutes Intravenous Every 8 hours 03/28/18 1516 03/28/18 1537   03/28/18 0710  bacitracin 50,000 Units in sodium chloride 0.9 % 500 mL irrigation  Status:  Discontinued       As needed 03/28/18 0851 03/28/18 1408   03/18/18 1800  ceFAZolin (ANCEF) IVPB 2g/100 mL premix     2 g 200 mL/hr over 30 Minutes Intravenous Every 8 hours 03/18/18 1649     03/17/18 0400  vancomycin (VANCOCIN) 1,500 mg in sodium chloride 0.9 % 500 mL IVPB  Status:  Discontinued     1,500 mg 250 mL/hr over 120 Minutes Intravenous Every 24 hours 03/16/18 1113 03/16/18 1120   03/17/18 0000  vancomycin (VANCOCIN) 1,500 mg in sodium chloride 0.9 % 500 mL IVPB  Status:  Discontinued     1,500 mg 250 mL/hr over 120 Minutes Intravenous Every 24 hours 03/16/18 1120 03/18/18 1553   03/16/18 0500  piperacillin-tazobactam (ZOSYN) IVPB 3.375 g  Status:  Discontinued     3.375 g 12.5 mL/hr over 240 Minutes Intravenous Every 8 hours 03/16/18 0127 03/18/18 1553   03/16/18 0130  vancomycin (VANCOCIN) IVPB 1000 mg/200 mL premix     1,000 mg 200 mL/hr over 60 Minutes Intravenous Every 1 hr x 2 03/16/18 0126 03/16/18 0602   03/15/18 2030  piperacillin-tazobactam (ZOSYN) IVPB 3.375 g     3.375 g 100 mL/hr over 30 Minutes Intravenous  Once 03/15/18 2017 03/15/18 2118      Medications: Scheduled Meds: . amLODipine  10 mg Oral Daily  . aspirin EC  81 mg Oral Daily  . chlorhexidine  15 mL Mouth Rinse BID  . cycloSPORINE  1 drop Both Eyes BID  . docusate sodium  100 mg Oral BID  . donepezil  10 mg Oral QHS  . feeding supplement (ENSURE ENLIVE)  237 mL Oral BID BM  . finasteride  5 mg Oral Daily  . gabapentin  600 mg Oral TID  . ipratropium-albuterol  3 mL Nebulization BID    . latanoprost  1 drop Both Eyes QHS  . mouth rinse  15 mL Mouth Rinse q12n4p  . memantine  10 mg Oral BID  . pantoprazole  40 mg Oral Daily  . potassium chloride  20 mEq Oral BID  . pravastatin  10 mg Oral QPM  . saccharomyces boulardii  250 mg Oral BID WC  . senna  1 tablet Oral BID  . sodium chloride flush  10-40 mL Intracatheter Q12H  . sodium chloride flush  3 mL Intravenous Q12H  . traZODone  100 mg Oral QHS  . venlafaxine XR  150 mg Oral BID  . vitamin B-12  1,000 mcg Oral Daily   Continuous Infusions: . sodium chloride    .  ceFAZolin (ANCEF) IV Stopped (03/29/18 0933)  . lactated ringers 125 mL/hr at 03/29/18 1311  . methocarbamol (ROBAXIN)  IV     PRN Meds:.acetaminophen **OR** acetaminophen, albuterol, alum & mag hydroxide-simeth, bisacodyl, bisacodyl, fluticasone, hydrALAZINE, HYDROmorphone (DILAUDID) injection, menthol-cetylpyridinium **OR** phenol, methocarbamol **OR** methocarbamol (ROBAXIN)  IV, morphine injection, naLOXone (NARCAN)  injection, ondansetron **OR** ondansetron (ZOFRAN) IV, oxyCODONE, oxyCODONE, oxyCODONE-acetaminophen, polyethylene glycol, sodium chloride flush, sodium chloride flush, sodium phosphate  Objective: Weight change:   Intake/Output Summary (Last 24 hours) at 03/29/2018 1446 Last data filed at 03/29/2018 1300 Gross per 24 hour  Intake 3813.92 ml  Output 2455 ml  Net 1358.92 ml   Blood pressure (!) 130/94, pulse 89, temperature 98.5 F (36.9 C), temperature source Oral, resp. rate 12, height '6\' 3"'  (1.905 m), weight 287 lb 7.7 oz (130.4 kg), SpO2 95 %. Temp:  [97.3 F (36.3 C)-98.5 F (36.9 C)] 98.5 F (36.9 C) (04/10 1200) Pulse Rate:  [83-100] 89 (04/10 1300) Resp:  [10-21] 12 (04/10 1300) BP: (93-143)/(54-94) 130/94 (04/10 1300) SpO2:  [92 %-100 %] 95 % (04/10 1300)  Physical Exam: General: Alert and awake, oriented x3, not in any acute distress, lying on side HEENT: anicteric sclera,, EOMI CVS regular rate, normal r,  no  murmur rubs or gallops Chest: clear to auscultation bilaterally, no wheezing, rales or rhonchi Abdomen: soft nontender, nondistended, normal bowel sounds, Extremities: no  clubbing or edema noted bilaterally Skin: no rashes, PICC is clean  Neuro: he has some return of strength in LE but still weak  CBC: CBC Latest Ref Rng & Units 03/29/2018 03/28/2018 03/27/2018  WBC 4.0 - 10.5 K/uL 12.8(H) 15.9(H) 10.1  Hemoglobin 13.0 - 17.0 g/dL 8.9(L) 9.7(L) 11.3(L)  Hematocrit 39.0 - 52.0 % 29.6(L) 31.2(L) 36.4(L)  Platelets 150 - 400 K/uL 619(H) 722(H) 746(H)      BMET Recent Labs    03/28/18 1723 03/29/18 0439  NA 133* 131*  K 5.7* 4.7  CL 100* 97*  CO2 24 25  GLUCOSE 138* 117*  BUN 19 19  CREATININE 1.09 1.03  CALCIUM 8.9 8.7*     Liver Panel  Recent Labs    03/28/18 1723 03/29/18 0439  PROT 7.4 6.7  ALBUMIN 2.6* 2.3*  AST 44* 43*  ALT 20 17  ALKPHOS 87 83  BILITOT 0.5 0.4       Sedimentation Rate No results for input(s): ESRSEDRATE in the last 72 hours. C-Reactive Protein No results for input(s): CRP in the last 72 hours.  Micro Results: Recent Results (from the past 720 hour(s))  Urine culture     Status: Abnormal   Collection Time: 03/15/18  6:30 PM  Result Value Ref Range Status   Specimen Description   Final    URINE, RANDOM Performed at [facility] [address] [place], [place] [address]    Special Requests   Final    NONE Performed at [facility], [address] [place], [place] [address]    Culture (A)  Final    <10,000 COLONIES/mL INSIGNIFICANT GROWTH Performed at [facility] [address], [place], [place] [address]    Report Status 03/17/2018 FINAL  Final  Culture, blood (routine x 2)     Status: Abnormal   Collection Time: 03/15/18  8:00 PM  Result Value Ref Range Status   Specimen Description   Final    BLOOD RIGHT WRIST Performed at [facility], [place] [address] [place], [place] [address]    Special Requests   Final      BOTTLES DRAWN AEROBIC AND ANAEROBIC Blood Culture adequate volume Performed at [facility], [address], [place], [place] [address]    Culture  Setup Time   Final    GRAM POSITIVE COCCI IN BOTH AEROBIC AND ANAEROBIC BOTTLES Performed at [facility] Gram Stain Report Called to,Read Back By and Verified With: JOHNSON,B'@[id number]'  BY MATTHEWS, B 3.30.19 CRITICAL RESULT CALLED TO, READ BACK  BY AND VERIFIED WITH: NATE HAYES,PHARMD [id number] BY SJW Performed at [facility], [place] [address] [place], [place] [address]    Culture STAPHYLOCOCCUS AUREUS (A)  Final   Report Status 03/20/2018 FINAL  Final   Organism ID, Bacteria STAPHYLOCOCCUS AUREUS  Final      Susceptibility   Staphylococcus aureus - MIC*    CIPROFLOXACIN <=0.5 SENSITIVE Sensitive     ERYTHROMYCIN 0.5 SENSITIVE Sensitive     GENTAMICIN <=0.5 SENSITIVE Sensitive     OXACILLIN <=0.25 SENSITIVE Sensitive     TETRACYCLINE <=1 SENSITIVE Sensitive     VANCOMYCIN <=0.5 SENSITIVE Sensitive     TRIMETH/SULFA <=10 SENSITIVE Sensitive     CLINDAMYCIN <=0.25 SENSITIVE Sensitive     RIFAMPIN <=0.5 SENSITIVE Sensitive     Inducible Clindamycin NEGATIVE Sensitive     * STAPHYLOCOCCUS AUREUS  Blood Culture ID Panel (Reflexed)     Status: Abnormal   Collection Time: 03/15/18  8:00 PM  Result Value Ref Range Status   Enterococcus species NOT DETECTED NOT DETECTED Final   Listeria monocytogenes NOT DETECTED NOT DETECTED Final   Staphylococcus species DETECTED (A) NOT DETECTED Final    Comment: CRITICAL RESULT CALLED TO, READ BACK BY AND VERIFIED WITH: NATE HAYES PHARMD AT 0954 ON [id number] BY SJW    Staphylococcus aureus DETECTED (A) NOT DETECTED Final    Comment: CRITICAL RESULT CALLED TO, READ BACK BY AND VERIFIED WITH: NATE HAYES PHARMD AT 0954 ON [id number] BY SJW    Methicillin resistance NOT DETECTED NOT DETECTED Final   Streptococcus species NOT DETECTED NOT DETECTED Final   Streptococcus agalactiae NOT DETECTED NOT  DETECTED Final   Streptococcus pneumoniae NOT DETECTED NOT DETECTED Final   Streptococcus pyogenes NOT DETECTED NOT DETECTED Final   Acinetobacter baumannii NOT DETECTED NOT DETECTED Final   Enterobacteriaceae species NOT DETECTED NOT DETECTED Final   Enterobacter cloacae complex NOT DETECTED NOT DETECTED Final   Escherichia coli NOT DETECTED NOT DETECTED Final   Klebsiella oxytoca NOT DETECTED NOT DETECTED Final   Klebsiella pneumoniae NOT DETECTED NOT DETECTED Final   Proteus species NOT DETECTED NOT DETECTED Final   Serratia marcescens NOT DETECTED NOT DETECTED Final   Haemophilus influenzae NOT DETECTED NOT DETECTED Final   Neisseria meningitidis NOT DETECTED NOT DETECTED Final   Pseudomonas aeruginosa NOT DETECTED NOT DETECTED Final   Candida albicans NOT DETECTED NOT DETECTED Final   Candida glabrata NOT DETECTED NOT DETECTED Final   Candida krusei NOT DETECTED NOT DETECTED Final   Candida parapsilosis NOT DETECTED NOT DETECTED Final   Candida tropicalis NOT DETECTED NOT DETECTED Final    Comment: Performed at [facility], [address], [place], [place] [address]  Culture, blood (routine x 2)     Status: None   Collection Time: 03/15/18  8:21 PM  Result Value Ref Range Status   Specimen Description BLOOD LEFT HAND  Final   Special Requests   Final    BOTTLES DRAWN AEROBIC AND ANAEROBIC Blood Culture adequate volume   Culture   Final    NO GROWTH 5 DAYS Performed at [facility], [address], [place], [place] [address]    Report Status 03/20/2018 FINAL  Final  Respiratory Panel by PCR     Status: None   Collection Time: 03/16/18 11:40 AM  Result Value Ref Range Status   Adenovirus NOT DETECTED NOT DETECTED Final   Coronavirus 229E NOT DETECTED NOT DETECTED Final   Coronavirus HKU1 NOT DETECTED NOT  DETECTED Final   Coronavirus NL63 NOT DETECTED NOT DETECTED Final   Coronavirus OC43 NOT DETECTED NOT DETECTED Final   Metapneumovirus NOT DETECTED NOT DETECTED Final     Rhinovirus / Enterovirus NOT DETECTED NOT DETECTED Final   Influenza A NOT DETECTED NOT DETECTED Final   Influenza B NOT DETECTED NOT DETECTED Final   Parainfluenza Virus 1 NOT DETECTED NOT DETECTED Final   Parainfluenza Virus 2 NOT DETECTED NOT DETECTED Final   Parainfluenza Virus 3 NOT DETECTED NOT DETECTED Final   Parainfluenza Virus 4 NOT DETECTED NOT DETECTED Final   Respiratory Syncytial Virus NOT DETECTED NOT DETECTED Final   Bordetella pertussis NOT DETECTED NOT DETECTED Final   Chlamydophila pneumoniae NOT DETECTED NOT DETECTED Final   Mycoplasma pneumoniae NOT DETECTED NOT DETECTED Final    Comment: Performed at [facility], [place] [address] [place], [place] [address]  Culture, blood (routine x 2)     Status: None   Collection Time: 03/18/18 12:38 PM  Result Value Ref Range Status   Specimen Description LEFT ANTECUBITAL  Final   Special Requests   Final    BOTTLES DRAWN AEROBIC AND ANAEROBIC Blood Culture adequate volume   Culture   Final    NO GROWTH 5 DAYS Performed at [facility], [address], [place], [place] [address]    Report Status 03/23/2018 FINAL  Final  Culture, blood (routine x 2)     Status: None   Collection Time: 03/18/18 12:45 PM  Result Value Ref Range Status   Specimen Description BLOOD LEFT HAND  Final   Special Requests   Final    BOTTLES DRAWN AEROBIC AND ANAEROBIC Blood Culture results may not be optimal due to an inadequate volume of blood received in culture bottles   Culture   Final    NO GROWTH 5 DAYS Performed at [facility], [address], [place], [place] [address]    Report Status 03/23/2018 FINAL  Final  C difficile quick scan w PCR reflex     Status: None   Collection Time: 03/19/18  5:04 PM  Result Value Ref Range Status   C Diff antigen NEGATIVE NEGATIVE Final   C Diff toxin NEGATIVE NEGATIVE Final   C Diff interpretation No C. difficile detected.  Final    Comment: VALID Performed at [facility], [address]  [place], [place] [address]   Gastrointestinal Panel by PCR , Stool     Status: None   Collection Time: 03/19/18  5:04 PM  Result Value Ref Range Status   Campylobacter species NOT DETECTED NOT DETECTED Final   Plesimonas shigelloides NOT DETECTED NOT DETECTED Final   Salmonella species NOT DETECTED NOT DETECTED Final   Yersinia enterocolitica NOT DETECTED NOT DETECTED Final   Vibrio species NOT DETECTED NOT DETECTED Final   Vibrio cholerae NOT DETECTED NOT DETECTED Final   Enteroaggregative E coli (EAEC) NOT DETECTED NOT DETECTED Final   Enteropathogenic E coli (EPEC) NOT DETECTED NOT DETECTED Final   Enterotoxigenic E coli (ETEC) NOT DETECTED NOT DETECTED Final   Shiga like toxin producing E coli (STEC) NOT DETECTED NOT DETECTED Final   Shigella/Enteroinvasive E coli (EIEC) NOT DETECTED NOT DETECTED Final   Cryptosporidium NOT DETECTED NOT DETECTED Final   Cyclospora cayetanensis NOT DETECTED NOT DETECTED Final   Entamoeba histolytica NOT DETECTED NOT DETECTED Final   Giardia lamblia NOT DETECTED NOT DETECTED Final   Adenovirus F40/41 NOT DETECTED NOT DETECTED Final   Astrovirus NOT DETECTED NOT DETECTED  Final   Norovirus GI/GII NOT DETECTED NOT DETECTED Final   Rotavirus A NOT DETECTED NOT DETECTED Final   Sapovirus (I, II, IV, and V) NOT DETECTED NOT DETECTED Final    Comment: Performed at [facility], [address] [place], [place] [address]  Surgical pcr screen     Status: None   Collection Time: 03/27/18 11:04 PM  Result Value Ref Range Status   MRSA, PCR NEGATIVE NEGATIVE Final   Staphylococcus aureus NEGATIVE NEGATIVE Final    Comment: (NOTE) The Xpert SA Assay (FDA approved for NASAL specimens in patients 45 years of age and older), is one component of a comprehensive surveillance program. It is not intended to diagnose infection nor to guide or monitor treatment. Performed at [facility], [place] [address], [place], [place] [address]   Aerobic/Anaerobic  Culture (surgical/deep wound)     Status: None (Preliminary result)   Collection Time: 03/28/18  9:51 AM  Result Value Ref Range Status   Specimen Description WOUND  Final   Special Requests DISCS T10 T11  Final   Gram Stain   Final    RARE WBC PRESENT,BOTH PMN AND MONONUCLEAR NO ORGANISMS SEEN    Culture   Final    NO GROWTH < 24 HOURS Performed at [facility], [place] [address] [place], [place] [address]    Report Status PENDING  Incomplete    Studies/Results: Dg Thoracolumabar Spine  Result Date: 03/28/2018 CLINICAL DATA:  T8 through L2 fixation. FLUOROSCOPY TIME:  1 minutes 8 seconds. EXAM: THORACOLUMBAR SPINE 1V COMPARISON:  CT scan of March 20, 2018. FINDINGS: Status post surgical posterior fusion extending from T8 to L2 with bilateral intrapedicular screw placement. Good alignment of vertebral bodies is noted. IMPRESSION: Status post surgical posterior fusion of thoracolumbar junction as described above. Electronically Signed   By: Marijo Conception, M.D.   On: 03/28/2018 13:32   Dg Lumbar Spine 1 View  Result Date: 03/28/2018 CLINICAL DATA:  T8 through L2 fusion EXAM: LUMBAR SPINE - 1 VIEW COMPARISON:  None. FINDINGS: Single cross-table lateral x-ray of the lumbar spine is provided. Posterior tissue retractors are noted. Posterior lumbar interbody fusion from L1 through L3. Interbody fusion at L3-4. Interbody cages at L4-5 and L5-S1. IMPRESSION: Intraoperative localization as described above. Electronically Signed   By: Kathreen Devoid   On: 03/28/2018 09:37   Dg C-arm 1-60 Min  Result Date: 03/28/2018 CLINICAL DATA:  T8 through L2 fixation. FLUOROSCOPY TIME:  1 minutes 8 seconds. EXAM: THORACOLUMBAR SPINE 1V COMPARISON:  CT scan of March 20, 2018. FINDINGS: Status post surgical posterior fusion extending from T8 to L2 with bilateral intrapedicular screw placement. Good alignment of vertebral bodies is noted. IMPRESSION: Status post surgical posterior fusion of thoracolumbar junction as  described above. Electronically Signed   By: Marijo Conception, M.D.   On: 03/28/2018 13:32   Dg C-arm 1-60 Min  Result Date: 03/28/2018 CLINICAL DATA:  T8 through L2 fixation. FLUOROSCOPY TIME:  1 minutes 8 seconds. EXAM: THORACOLUMBAR SPINE 1V COMPARISON:  CT scan of March 20, 2018. FINDINGS: Status post surgical posterior fusion extending from T8 to L2 with bilateral intrapedicular screw placement. Good alignment of vertebral bodies is noted. IMPRESSION: Status post surgical posterior fusion of thoracolumbar junction as described above. Electronically Signed   By: Marijo Conception, M.D.   On: 03/28/2018 13:32      Assessment/Plan:  INTERVAL HISTORY: Status post neurosurgery   Principal Problem:   Bacteremia  due to methicillin susceptible Staphylococcus aureus (MSSA) Active Problems:   HYPERCHOLESTEROLEMIA   Essential hypertension, benign   CAD, NATIVE VESSEL   GERD   Chronic back pain   History of cerebrovascular disease   Weakness   Fever   Sepsis ([place])   Nephrolithiasis   AKI (acute kidney injury) ([place])   Acute metabolic encephalopathy   Cognitive impairment   Chronic pain syndrome   Obesity   Staphylococcus aureus sepsis ([facility])   Encephalopathy   Weakness of both lower extremities   Discitis of thoracic region   Spinal cord stimulator status   Abdominal distension    Cody Velez is a 72 y.o. male with with history of cervical and lumbar surgery, cervical implanted spinal stimulator he was admitted to any pen with methicillin sensitive Staphylococcus aureus bacteremia then found to have progressive lower extremity weakness.  Imaging was compromised by the fact that he could not get MRIs and there was debate back and forth about whether or not he could.  Ultimately he was transferred to [facility] and fortunately Dr. Ellene Route was able to take him to the operating room yesterday where he performed a T10-T11 laminectomy with decompression of an extensive epidural abscess with  debridement of disc space at T10-T11 asked segmental fixation from T8 L3 with methyl correlate screw augmentation in T8 and T9.  I will plan on giving him 8 weeks of cefazolin and if there is no contraindication add rifampin to his antibiotics tomorrow   Given hardware that had to be place we will want to give him more protracted oral antibiotics after he finishes his IV abx  Tentative plan for antibiotics is:  Diagnosis: Methicillin sensitive staph aureus bacteremia with sepsis and extensive epidural abscess discitis and thoracic spine  Culture Result: MSSA  No Known Allergies  OPAT Orders  Discharge antibiotics: Per pharmacy protocol IV cefazolin 2 g q. 8 hours x 8 weeks + likely oral rifampin  Duration: 8 weeks End Date: June 3rd  [facility] Care Per Protocol:  Labs weekly while on IV antibiotics: _x_ CBC with differential  x__ CMP _x_ CRP _x_ ESR   _x_ Please pull PIC at completion of IV antibiotics __ Please leave PIC in place until doctor has seen patient or been notified  Fax weekly labs to [phone]  Clinic Follow Up Appt: Next 3-4 weeks with [place] in ID.       LOS: 13 days   Alcide Evener 03/29/2018, 2:46 PM

## 2018-03-29 NOTE — Evaluation (Signed)
Physical Therapy Re-Evaluation Patient Details Name: Cody Velez MRN: [id number] DOB: 24-Jul-1946 Today's Date: 03/29/2018   History of Present Illness  Cody Velez is a 72 y.o. male with medical history significant for chronic back pain with recent spinal cord stimulator placement several weeks ago, nephrolithiasis with recent admission for UTI, hypertension, dyslipidemia, morbid obesity, and CAD who was admitted due to weakness and fall at home.  Found to have osteomyelitis T10-11 and patient underwent laminectomy T10-T11 decompression of epidural abscess, debridement of discitis T10-11 and segmental fixation from T8 through L3.  Clinical Impression  Patient presents s/p surgical fixation with L>R LE weakness and bilateral sensory deficits.  Feel he may also have autonomic dysfunction with possible BP drop in sitting this session.  Was able to come up slightly with stedy and elevation to bed, but could not come off stedy without significant assist and lateral lean.  Currently appropriate for CIR level rehab for comprehensive interdisciplinary skilled care prior to d/c home with assist.  PT to follow acutely.     Follow Up Recommendations CIR    Equipment Recommendations  Other (comment)(TBA)    Recommendations for Other Services Rehab consult     Precautions / Restrictions Precautions Precautions: Fall;Back Precaution Booklet Issued: No Precaution Comments: Pt able to recall BLT with Min VCs. Pt use spinal body mechanics to protect for pain and prior diagnoses Required Braces or Orthoses: Spinal Brace Spinal Brace: Thoracolumbosacral orthotic;Applied in sitting position Restrictions Weight Bearing Restrictions: No Other Position/Activity Restrictions: sit upright in chair for spinal protection      Mobility  Bed Mobility Overal bed mobility: Needs Assistance Bed Mobility: Rolling;Sidelying to Sit;Sit to Sidelying Rolling: Max assist;+2 for physical assistance Sidelying  to sit: Max assist;+2 for physical assistance     Sit to sidelying: Max assist;+2 for physical assistance General bed mobility comments: +rail, assist with BLE and to elevate trunk; cues for technique, increased time  Transfers Overall transfer level: Needs assistance   Transfers: Sit to/from Stand Sit to Stand: Max assist;+2 physical assistance;From elevated surface         General transfer comment: Max A +2 to clear bottom from EOB. DUring second attempt, pt able to raise bottom enough to bring bari stedy seat down. Pt fatiguing quickly and then requiring Total A to laterally lean and remove stedy seat.  Ambulation/Gait                Stairs            Wheelchair Mobility    Modified Rankin (Stroke Patients Only)       Balance Overall balance assessment: Needs assistance Sitting-balance support: Feet supported;Bilateral upper extremity supported Sitting balance-Leahy Scale: Poor Sitting balance - Comments: requiring UE for support, and as fatigued needed up to mod A   Standing balance support: Bilateral upper extremity supported;During functional activity Standing balance-Leahy Scale: Zero Standing balance comment: could not stand fully                             Pertinent Vitals/Pain Pain Assessment: Faces Faces Pain Scale: Hurts whole lot Pain Location: low back Pain Descriptors / Indicators: Sharp;Aching;Grimacing;Guarding Pain Intervention(s): Monitored during session;Repositioned;Patient requesting pain meds-RN notified    Home Living Family/patient expects to be discharged to:: Private residence Living Arrangements: Non-relatives/Friends(girlfriend) Available Help at Discharge: Friend(s);Available 24 hours/day(girlfriend returns Apr20) Type of Home: House Home Access: Stairs to enter Entrance Stairs-Rails: Right;Left;Can reach both Entrance [employer]  of Steps: 7(family states working on ramp) Home Layout: Two level;Able to  live on main level with bedroom/bathroom Home Equipment: Kasandra Knudsen - single point;Walker - 2 wheels      Prior Function Level of Independence: Independent               Hand Dominance   Dominant Hand: Right    Extremity/Trunk Assessment   Upper Extremity Assessment Upper Extremity Assessment: Defer to OT evaluation    Lower Extremity Assessment Lower Extremity Assessment: RLE deficits/detail;LLE deficits/detail RLE Deficits / Details: AAROM grossly WFL, strength hip flexion 2/5, knee extension 3-/5, ankle DF 3/5; sensation impaired to approx T9-10 RLE Sensation: decreased proprioception;decreased light touch LLE Deficits / Details: AAROM grossly WFL, strength hip flexion trace, knee extension 2/5, ankle DF 0/5; sensation impaired to approx T9-10 LLE Sensation: decreased proprioception;decreased light touch    Cervical / Trunk Assessment Cervical / Trunk Assessment: Other exceptions Cervical / Trunk Exceptions: limited trunk control at EOB  Communication   Communication: No difficulties  Cognition Arousal/Alertness: Awake/alert Behavior During Therapy: WFL for tasks assessed/performed Overall Cognitive Status: Within Functional Limits for tasks assessed                                 General Comments: Daughter (who is Therapist, sports at [employer] hospital) was present throughout session      General Comments General comments (skin integrity, edema, etc.): daughter present and supportive throughout session, hopeful for inpatient rehab; unable to take BP in sitting due to pt so reliant on UE support when up in stedy, but was diaphoretic and fatiguing fast, BP in supine 117/70    Exercises     Assessment/Plan    PT Assessment Patient needs continued PT services  PT Problem List Decreased strength;Decreased mobility;Decreased safety awareness;Decreased knowledge of precautions;Decreased activity tolerance;Decreased balance;Decreased knowledge of use of DME;Impaired  sensation;Pain       PT Treatment Interventions DME instruction;Therapeutic activities;Therapeutic exercise;Patient/family education;Balance training;Wheelchair mobility training;Functional mobility training    PT Goals (Current goals can be found in the Care Plan section)  Acute Rehab PT Goals Patient Stated Goal: hopeful for rehab PT Goal Formulation: With patient/family Time For Goal Achievement: 04/12/18 Potential to Achieve Goals: Good    Frequency Min 5X/week   Barriers to discharge        Co-evaluation               AM-PAC PT "6 Clicks" Daily Activity  Outcome Measure Difficulty turning over in bed (including adjusting bedclothes, sheets and blankets)?: Unable Difficulty moving from lying on back to sitting on the side of the bed? : Unable Difficulty sitting down on and standing up from a chair with arms (e.g., wheelchair, bedside commode, etc,.)?: Unable Help needed moving to and from a bed to chair (including a wheelchair)?: Total Help needed walking in hospital room?: Total Help needed climbing 3-5 steps with a railing? : Total 6 Click Score: 6    End of Session Equipment Utilized During Treatment: Oxygen;Other (comment)(sara stedy) Activity Tolerance: Patient limited by pain;Patient limited by fatigue Patient left: with call bell/phone within reach;in bed;with family/visitor present Nurse Communication: Mobility status;Need for lift equipment PT Visit Diagnosis: Muscle weakness (generalized) (M62.81);Other symptoms and signs involving the nervous system (R29.898);Other abnormalities of gait and mobility (R26.89);History of falling (Z91.81)    Time: 1440-1520 PT Time Calculation (min) (ACUTE ONLY): 40 min   Charges:   PT Evaluation $PT Re-evaluation: 1  Re-eval PT Treatments $Therapeutic Activity: 8-22 mins   PT G CodesMagda Kiel, [place] [phone] 03/29/2018   Reginia Naas 03/29/2018, 5:16 PM

## 2018-03-30 ENCOUNTER — Other Ambulatory Visit: Payer: Self-pay

## 2018-03-30 ENCOUNTER — Encounter (HOSPITAL_COMMUNITY): Payer: Self-pay

## 2018-03-30 ENCOUNTER — Inpatient Hospital Stay (HOSPITAL_COMMUNITY)
Admission: RE | Admit: 2018-03-30 | Discharge: 2018-05-06 | DRG: 560 | Disposition: A | Discharge status: Home Health | Payer: Medicare Other | Source: Intra-hospital | Attending: Physical Medicine & Rehabilitation | Admitting: Physical Medicine & Rehabilitation

## 2018-03-30 DIAGNOSIS — G8220 Paraplegia, unspecified: Secondary | ICD-10-CM | POA: Diagnosis not present

## 2018-03-30 DIAGNOSIS — Z981 Arthrodesis status: Secondary | ICD-10-CM | POA: Diagnosis not present

## 2018-03-30 DIAGNOSIS — E785 Hyperlipidemia, unspecified: Secondary | ICD-10-CM | POA: Diagnosis present

## 2018-03-30 DIAGNOSIS — Z452 Encounter for adjustment and management of vascular access device: Secondary | ICD-10-CM

## 2018-03-30 DIAGNOSIS — K5900 Constipation, unspecified: Secondary | ICD-10-CM | POA: Diagnosis present

## 2018-03-30 DIAGNOSIS — K921 Melena: Secondary | ICD-10-CM | POA: Diagnosis not present

## 2018-03-30 DIAGNOSIS — Z79899 Other long term (current) drug therapy: Secondary | ICD-10-CM | POA: Diagnosis not present

## 2018-03-30 DIAGNOSIS — Z87891 Personal history of nicotine dependence: Secondary | ICD-10-CM

## 2018-03-30 DIAGNOSIS — B9561 Methicillin susceptible Staphylococcus aureus infection as the cause of diseases classified elsewhere: Secondary | ICD-10-CM | POA: Diagnosis present

## 2018-03-30 DIAGNOSIS — G479 Sleep disorder, unspecified: Secondary | ICD-10-CM | POA: Diagnosis not present

## 2018-03-30 DIAGNOSIS — I824Z3 Acute embolism and thrombosis of unspecified deep veins of distal lower extremity, bilateral: Secondary | ICD-10-CM | POA: Diagnosis not present

## 2018-03-30 DIAGNOSIS — Z801 Family history of malignant neoplasm of trachea, bronchus and lung: Secondary | ICD-10-CM

## 2018-03-30 DIAGNOSIS — Z800 Family history of malignant neoplasm of digestive organs: Secondary | ICD-10-CM

## 2018-03-30 DIAGNOSIS — Z6835 Body mass index (BMI) 35.0-35.9, adult: Secondary | ICD-10-CM

## 2018-03-30 DIAGNOSIS — M4625 Osteomyelitis of vertebra, thoracolumbar region: Secondary | ICD-10-CM | POA: Diagnosis not present

## 2018-03-30 DIAGNOSIS — M961 Postlaminectomy syndrome, not elsewhere classified: Secondary | ICD-10-CM

## 2018-03-30 DIAGNOSIS — Z8673 Personal history of transient ischemic attack (TIA), and cerebral infarction without residual deficits: Secondary | ICD-10-CM | POA: Diagnosis not present

## 2018-03-30 DIAGNOSIS — G8918 Other acute postprocedural pain: Secondary | ICD-10-CM

## 2018-03-30 DIAGNOSIS — E538 Deficiency of other specified B group vitamins: Secondary | ICD-10-CM | POA: Diagnosis present

## 2018-03-30 DIAGNOSIS — D649 Anemia, unspecified: Secondary | ICD-10-CM | POA: Diagnosis present

## 2018-03-30 DIAGNOSIS — R7881 Bacteremia: Secondary | ICD-10-CM | POA: Diagnosis present

## 2018-03-30 DIAGNOSIS — F4312 Post-traumatic stress disorder, chronic: Secondary | ICD-10-CM | POA: Diagnosis present

## 2018-03-30 DIAGNOSIS — M19012 Primary osteoarthritis, left shoulder: Secondary | ICD-10-CM | POA: Diagnosis present

## 2018-03-30 DIAGNOSIS — F4310 Post-traumatic stress disorder, unspecified: Secondary | ICD-10-CM | POA: Diagnosis present

## 2018-03-30 DIAGNOSIS — F329 Major depressive disorder, single episode, unspecified: Secondary | ICD-10-CM | POA: Diagnosis present

## 2018-03-30 DIAGNOSIS — D62 Acute posthemorrhagic anemia: Secondary | ICD-10-CM | POA: Diagnosis not present

## 2018-03-30 DIAGNOSIS — Z803 Family history of malignant neoplasm of breast: Secondary | ICD-10-CM

## 2018-03-30 DIAGNOSIS — G992 Myelopathy in diseases classified elsewhere: Secondary | ICD-10-CM | POA: Diagnosis not present

## 2018-03-30 DIAGNOSIS — Z8249 Family history of ischemic heart disease and other diseases of the circulatory system: Secondary | ICD-10-CM

## 2018-03-30 DIAGNOSIS — R609 Edema, unspecified: Secondary | ICD-10-CM | POA: Diagnosis not present

## 2018-03-30 DIAGNOSIS — Z823 Family history of stroke: Secondary | ICD-10-CM

## 2018-03-30 DIAGNOSIS — Z4789 Encounter for other orthopedic aftercare: Secondary | ICD-10-CM | POA: Diagnosis not present

## 2018-03-30 DIAGNOSIS — R339 Retention of urine, unspecified: Secondary | ICD-10-CM | POA: Diagnosis present

## 2018-03-30 DIAGNOSIS — I82491 Acute embolism and thrombosis of other specified deep vein of right lower extremity: Secondary | ICD-10-CM | POA: Diagnosis present

## 2018-03-30 DIAGNOSIS — Z9181 History of falling: Secondary | ICD-10-CM

## 2018-03-30 DIAGNOSIS — G8929 Other chronic pain: Secondary | ICD-10-CM | POA: Diagnosis present

## 2018-03-30 DIAGNOSIS — I2510 Atherosclerotic heart disease of native coronary artery without angina pectoris: Secondary | ICD-10-CM | POA: Diagnosis present

## 2018-03-30 DIAGNOSIS — G959 Disease of spinal cord, unspecified: Secondary | ICD-10-CM | POA: Diagnosis not present

## 2018-03-30 DIAGNOSIS — D638 Anemia in other chronic diseases classified elsewhere: Secondary | ICD-10-CM

## 2018-03-30 DIAGNOSIS — M7989 Other specified soft tissue disorders: Secondary | ICD-10-CM | POA: Diagnosis not present

## 2018-03-30 DIAGNOSIS — F0390 Unspecified dementia without behavioral disturbance: Secondary | ICD-10-CM | POA: Diagnosis not present

## 2018-03-30 DIAGNOSIS — I10 Essential (primary) hypertension: Secondary | ICD-10-CM | POA: Diagnosis present

## 2018-03-30 DIAGNOSIS — K219 Gastro-esophageal reflux disease without esophagitis: Secondary | ICD-10-CM | POA: Diagnosis present

## 2018-03-30 DIAGNOSIS — Z87442 Personal history of urinary calculi: Secondary | ICD-10-CM

## 2018-03-30 DIAGNOSIS — G4733 Obstructive sleep apnea (adult) (pediatric): Secondary | ICD-10-CM | POA: Diagnosis present

## 2018-03-30 DIAGNOSIS — G061 Intraspinal abscess and granuloma: Secondary | ICD-10-CM | POA: Diagnosis not present

## 2018-03-30 DIAGNOSIS — E876 Hypokalemia: Secondary | ICD-10-CM

## 2018-03-30 DIAGNOSIS — I82493 Acute embolism and thrombosis of other specified deep vein of lower extremity, bilateral: Secondary | ICD-10-CM | POA: Diagnosis not present

## 2018-03-30 DIAGNOSIS — Z825 Family history of asthma and other chronic lower respiratory diseases: Secondary | ICD-10-CM

## 2018-03-30 DIAGNOSIS — R32 Unspecified urinary incontinence: Secondary | ICD-10-CM | POA: Diagnosis not present

## 2018-03-30 DIAGNOSIS — J449 Chronic obstructive pulmonary disease, unspecified: Secondary | ICD-10-CM | POA: Diagnosis not present

## 2018-03-30 DIAGNOSIS — Z8546 Personal history of malignant neoplasm of prostate: Secondary | ICD-10-CM | POA: Diagnosis not present

## 2018-03-30 DIAGNOSIS — Z79891 Long term (current) use of opiate analgesic: Secondary | ICD-10-CM

## 2018-03-30 DIAGNOSIS — Z8661 Personal history of infections of the central nervous system: Secondary | ICD-10-CM

## 2018-03-30 DIAGNOSIS — K649 Unspecified hemorrhoids: Secondary | ICD-10-CM | POA: Diagnosis present

## 2018-03-30 DIAGNOSIS — M4325 Fusion of spine, thoracolumbar region: Secondary | ICD-10-CM | POA: Diagnosis not present

## 2018-03-30 DIAGNOSIS — K5901 Slow transit constipation: Secondary | ICD-10-CM | POA: Diagnosis not present

## 2018-03-30 DIAGNOSIS — Z7982 Long term (current) use of aspirin: Secondary | ICD-10-CM

## 2018-03-30 MED ORDER — BACLOFEN 5 MG HALF TABLET
5.0000 mg | ORAL_TABLET | Freq: Three times a day (TID) | ORAL | Status: DC | PRN
  Administered 2018-03-31 – 2018-04-14 (×9): 5 mg via ORAL
  Filled 2018-03-30 (×9): qty 1

## 2018-03-30 MED ORDER — MEMANTINE HCL 10 MG PO TABS
10.0000 mg | ORAL_TABLET | Freq: Two times a day (BID) | ORAL | Status: DC
  Administered 2018-03-30 – 2018-05-06 (×74): 10 mg via ORAL
  Filled 2018-03-30 (×77): qty 1

## 2018-03-30 MED ORDER — SENNA 8.6 MG PO TABS
1.0000 | ORAL_TABLET | Freq: Two times a day (BID) | ORAL | Status: DC
  Administered 2018-03-30 – 2018-04-10 (×22): 8.6 mg via ORAL
  Filled 2018-03-30 (×22): qty 1

## 2018-03-30 MED ORDER — TRAZODONE HCL 50 MG PO TABS
100.0000 mg | ORAL_TABLET | Freq: Every day | ORAL | Status: DC
  Administered 2018-03-30 – 2018-05-05 (×36): 100 mg via ORAL
  Filled 2018-03-30 (×37): qty 2

## 2018-03-30 MED ORDER — VITAMIN B-12 1000 MCG PO TABS
1000.0000 ug | ORAL_TABLET | Freq: Every day | ORAL | Status: DC
  Administered 2018-03-31 – 2018-05-06 (×36): 1000 ug via ORAL
  Filled 2018-03-30 (×36): qty 1

## 2018-03-30 MED ORDER — AMLODIPINE BESYLATE 10 MG PO TABS
10.0000 mg | ORAL_TABLET | Freq: Every day | ORAL | Status: DC
  Administered 2018-03-31 – 2018-05-06 (×35): 10 mg via ORAL
  Filled 2018-03-30 (×37): qty 1

## 2018-03-30 MED ORDER — VENLAFAXINE HCL ER 150 MG PO CP24
150.0000 mg | ORAL_CAPSULE | Freq: Two times a day (BID) | ORAL | Status: DC
  Administered 2018-03-30 – 2018-05-06 (×74): 150 mg via ORAL
  Filled 2018-03-30 (×76): qty 1

## 2018-03-30 MED ORDER — HYDRALAZINE HCL 20 MG/ML IJ SOLN
10.0000 mg | Freq: Four times a day (QID) | INTRAMUSCULAR | Status: DC | PRN
  Filled 2018-03-30: qty 0.5

## 2018-03-30 MED ORDER — FINASTERIDE 5 MG PO TABS
5.0000 mg | ORAL_TABLET | Freq: Every day | ORAL | Status: DC
  Administered 2018-03-31 – 2018-05-06 (×37): 5 mg via ORAL
  Filled 2018-03-30 (×39): qty 1

## 2018-03-30 MED ORDER — METHOCARBAMOL 500 MG PO TABS
500.0000 mg | ORAL_TABLET | Freq: Four times a day (QID) | ORAL | Status: DC | PRN
  Administered 2018-03-30: 500 mg via ORAL
  Filled 2018-03-30: qty 1

## 2018-03-30 MED ORDER — OXYCODONE-ACETAMINOPHEN 5-325 MG PO TABS
2.0000 | ORAL_TABLET | ORAL | Status: DC | PRN
Start: 2018-03-30 — End: 2018-04-03
  Administered 2018-03-30 – 2018-04-03 (×13): 2 via ORAL
  Filled 2018-03-30 (×13): qty 2

## 2018-03-30 MED ORDER — BISACODYL 5 MG PO TBEC
5.0000 mg | DELAYED_RELEASE_TABLET | Freq: Every day | ORAL | Status: DC | PRN
  Administered 2018-03-30: 5 mg via ORAL
  Filled 2018-03-30: qty 1

## 2018-03-30 MED ORDER — MENTHOL 3 MG MT LOZG
1.0000 | LOZENGE | OROMUCOSAL | Status: DC | PRN
  Filled 2018-03-30: qty 9

## 2018-03-30 MED ORDER — DONEPEZIL HCL 10 MG PO TABS
10.0000 mg | ORAL_TABLET | Freq: Every day | ORAL | Status: DC
  Administered 2018-03-30 – 2018-05-05 (×37): 10 mg via ORAL
  Filled 2018-03-30 (×37): qty 1

## 2018-03-30 MED ORDER — METHOCARBAMOL 1000 MG/10ML IJ SOLN
500.0000 mg | Freq: Four times a day (QID) | INTRAVENOUS | Status: DC | PRN
  Filled 2018-03-30: qty 5

## 2018-03-30 MED ORDER — PANTOPRAZOLE SODIUM 40 MG PO TBEC
40.0000 mg | DELAYED_RELEASE_TABLET | Freq: Every day | ORAL | Status: DC
  Administered 2018-03-31 – 2018-05-06 (×37): 40 mg via ORAL
  Filled 2018-03-30 (×37): qty 1

## 2018-03-30 MED ORDER — SORBITOL 70 % SOLN
30.0000 mL | Freq: Every day | Status: DC | PRN
  Administered 2018-04-30: 30 mL via ORAL
  Filled 2018-03-30 (×2): qty 30

## 2018-03-30 MED ORDER — ENSURE ENLIVE PO LIQD
237.0000 mL | Freq: Two times a day (BID) | ORAL | Status: DC
  Administered 2018-03-31 – 2018-05-06 (×47): 237 mL via ORAL

## 2018-03-30 MED ORDER — LATANOPROST 0.005 % OP SOLN
1.0000 [drp] | Freq: Every day | OPHTHALMIC | Status: DC
  Administered 2018-03-30 – 2018-05-05 (×37): 1 [drp] via OPHTHALMIC
  Filled 2018-03-30: qty 2.5

## 2018-03-30 MED ORDER — ALUM & MAG HYDROXIDE-SIMETH 200-200-20 MG/5ML PO SUSP: 30.0000 mL | Freq: Four times a day (QID) | ORAL | Status: DC | PRN

## 2018-03-30 MED ORDER — CYCLOSPORINE 0.05 % OP EMUL
1.0000 [drp] | Freq: Two times a day (BID) | OPHTHALMIC | Status: DC
  Administered 2018-03-30 – 2018-04-05 (×13): 1 [drp] via OPHTHALMIC
  Administered 2018-04-06: 2 [drp] via OPHTHALMIC
  Administered 2018-04-06 – 2018-04-07 (×2): 1 [drp] via OPHTHALMIC
  Administered 2018-04-07: 2 [drp] via OPHTHALMIC
  Administered 2018-04-08 – 2018-05-06 (×57): 1 [drp] via OPHTHALMIC
  Filled 2018-03-30 (×77): qty 1

## 2018-03-30 MED ORDER — BISACODYL 10 MG RE SUPP
10.0000 mg | Freq: Every day | RECTAL | Status: DC | PRN
  Administered 2018-04-30: 10 mg via RECTAL
  Filled 2018-03-30: qty 1

## 2018-03-30 MED ORDER — ALBUTEROL SULFATE (2.5 MG/3ML) 0.083% IN NEBU: 2.5000 mg | INHALATION_SOLUTION | RESPIRATORY_TRACT | Status: DC | PRN

## 2018-03-30 MED ORDER — ONDANSETRON HCL 4 MG/2ML IJ SOLN: 4.0000 mg | Freq: Four times a day (QID) | INTRAMUSCULAR | Status: DC | PRN

## 2018-03-30 MED ORDER — ACETAMINOPHEN 650 MG RE SUPP: 650.0000 mg | RECTAL | Status: DC | PRN

## 2018-03-30 MED ORDER — GABAPENTIN 300 MG PO CAPS
600.0000 mg | ORAL_CAPSULE | Freq: Three times a day (TID) | ORAL | Status: DC
  Administered 2018-03-30 – 2018-05-06 (×111): 600 mg via ORAL
  Filled 2018-03-30 (×113): qty 2

## 2018-03-30 MED ORDER — CEFAZOLIN IV (FOR PTA / DISCHARGE USE ONLY): 2.0000 g | Freq: Three times a day (TID) | INTRAVENOUS | 0 refills | Status: DC

## 2018-03-30 MED ORDER — PHENOL 1.4 % MT LIQD: 1.0000 | OROMUCOSAL | Status: DC | PRN

## 2018-03-30 MED ORDER — OXYCODONE-ACETAMINOPHEN 5-325 MG PO TABS
2.0000 | ORAL_TABLET | Freq: Four times a day (QID) | ORAL | Status: DC | PRN
  Administered 2018-03-30: 2 via ORAL
  Filled 2018-03-30: qty 2

## 2018-03-30 MED ORDER — FLUTICASONE PROPIONATE 50 MCG/ACT NA SUSP
1.0000 | Freq: Every day | NASAL | Status: DC | PRN
  Administered 2018-04-10 – 2018-04-12 (×2): 1 via NASAL
  Filled 2018-03-30: qty 16

## 2018-03-30 MED ORDER — ONDANSETRON HCL 4 MG PO TABS: 4.0000 mg | ORAL_TABLET | Freq: Four times a day (QID) | ORAL | Status: DC | PRN

## 2018-03-30 MED ORDER — RIFAMPIN 300 MG PO CAPS
300.0000 mg | ORAL_CAPSULE | Freq: Two times a day (BID) | ORAL | Status: DC
  Administered 2018-03-30 – 2018-05-06 (×74): 300 mg via ORAL
  Filled 2018-03-30 (×77): qty 1

## 2018-03-30 MED ORDER — ACETAMINOPHEN 325 MG PO TABS: 650.0000 mg | ORAL_TABLET | ORAL | Status: DC | PRN

## 2018-03-30 MED ORDER — PRAVASTATIN SODIUM 20 MG PO TABS
10.0000 mg | ORAL_TABLET | Freq: Every evening | ORAL | Status: DC
  Administered 2018-03-30 – 2018-04-22 (×24): 10 mg via ORAL
  Administered 2018-04-23: 5 mg via ORAL
  Administered 2018-04-24 – 2018-05-05 (×12): 10 mg via ORAL
  Filled 2018-03-30 (×37): qty 1

## 2018-03-30 MED ORDER — METHOCARBAMOL 750 MG PO TABS: 750.0000 mg | ORAL_TABLET | Freq: Four times a day (QID) | ORAL | Status: DC | PRN

## 2018-03-30 MED ORDER — MAGNESIUM CITRATE PO SOLN: 1.0000 | Freq: Once | ORAL | Status: DC

## 2018-03-30 MED ORDER — CEFAZOLIN SODIUM-DEXTROSE 2-4 GM/100ML-% IV SOLN
2.0000 g | Freq: Three times a day (TID) | INTRAVENOUS | Status: DC
  Administered 2018-03-30 – 2018-05-06 (×110): 2 g via INTRAVENOUS
  Filled 2018-03-30 (×126): qty 100

## 2018-03-30 MED ORDER — RIFAMPIN 300 MG PO CAPS: 300.0000 mg | ORAL_CAPSULE | Freq: Two times a day (BID) | ORAL | Status: DC

## 2018-03-30 MED ORDER — IPRATROPIUM-ALBUTEROL 0.5-2.5 (3) MG/3ML IN SOLN
3.0000 mL | Freq: Two times a day (BID) | RESPIRATORY_TRACT | Status: DC
  Administered 2018-03-30 – 2018-04-03 (×7): 3 mL via RESPIRATORY_TRACT
  Filled 2018-03-30 (×9): qty 3

## 2018-03-30 MED ORDER — DOCUSATE SODIUM 100 MG PO CAPS
100.0000 mg | ORAL_CAPSULE | Freq: Two times a day (BID) | ORAL | Status: DC
  Administered 2018-03-30 – 2018-04-10 (×22): 100 mg via ORAL
  Filled 2018-03-30 (×22): qty 1

## 2018-03-30 MED ORDER — POLYETHYLENE GLYCOL 3350 17 G PO PACK
17.0000 g | PACK | Freq: Every day | ORAL | Status: DC | PRN
  Administered 2018-04-14 – 2018-04-15 (×2): 17 g via ORAL
  Filled 2018-03-30 (×2): qty 1

## 2018-03-30 MED ORDER — RIFAMPIN 300 MG PO CAPS
300.0000 mg | ORAL_CAPSULE | Freq: Two times a day (BID) | ORAL | Status: DC
  Administered 2018-03-30: 300 mg via ORAL
  Filled 2018-03-30: qty 1

## 2018-03-30 MED ORDER — ASPIRIN EC 81 MG PO TBEC
81.0000 mg | DELAYED_RELEASE_TABLET | Freq: Every day | ORAL | Status: DC
  Administered 2018-03-31 – 2018-05-06 (×37): 81 mg via ORAL
  Filled 2018-03-30 (×37): qty 1

## 2018-03-30 NOTE — Progress Notes (Signed)
Retta Diones, RN  Rehab Admission Coordinator  Physical Medicine and Rehabilitation  PMR Pre-admission  Signed  Date of Service:  03/30/2018 12:07 PM       Related encounter: ED to Hosp-Admission (Current) from 03/15/2018 in [place] NEURO/TRAUMA/SURGICAL ICU      Signed            [] Hide copied text  [] Hover for details   PMR Admission Coordinator Pre-Admission Assessment  Patient: Cody Velez is an 72 y.o., male MRN: [id number] DOB: 11/09/1946 Height: 6\' 3"  (190.5 cm) Weight: 130.4 kg (287 lb 7.7 oz)                                                                                                                                                  Insurance Information HMO: No   PPO:       PCP:       IPA:       80/20:       OTHER:   PRIMARY:  Medicare A and B      Policy#: [id number]      Subscriber: patient CM Name:        Phone#:       Fax#:   Pre-Cert#:        Employer:  Retired Benefits:  Phone #:       Name: Checked in passport one source [employer] Date: A=08/21/11 and B=06/19/17     Deduct: $1364      Out of Pocket Max: none      Life Max: N/A CIR: 100%      SNF: 100 days Outpatient: 80%     Co-Pay: 20% Home Health: 100%      Co-Pay: none DME: 80%     Co-Pay: 20% Providers: patient's choice  SECONDARY: BCBS [employer] emp PPO      Policy#: [id number]      Subscriber: patient CM Name:        Phone#:       Fax#:   Pre-Cert#:        Employer: Retired Benefits:  Phone #: [phone]     Name:   Eff. Date:       Deduct:        Out of Pocket Max:        Life Max:   CIR:        SNF:   Outpatient:       Co-Pay:   Home Health:        Co-Pay:   DME:       Co-Pay:    Emergency Contact Information         Contact Information    Name Relation Home Work Mobile   Booker,[name] Daughter [phone]  [phone]   Smith,Stephanie Daughter [phone]  [phone]   Davis,Christine Daughter   [phone]   Johnson,Toni Significant  other [phone]  [phone]       Current Medical History  Patient Admitting Diagnosis:T10-11 osteomyelitis with subsequent myelopathy/paraplegia, s/p decompression and fusion T8-L3  History of Present Illness:  A 72 y.o.right-handed malewith history of hypertension, memory deficits maintained on Aricept and Namenda, prostate cancer, PTSD, chronic back pain with multiple lumbar surgeries with recent spinal cord stimulator placement January 2019 as well as treatment for recent UTI. Per chart review patient lives with girlfriend. Independent with a cane prior to admissionand still driving. 2 level home with bedroom on main and 7 steps to entry with plan for ramp. Girlfriend can assist as neededhowever she is currently on vacation in [place] until April 20. Presented 03/16/2018 with lethargy/fall as well as hypoxemia with paraparesis. CT Angioof the chest negative for pulmonary emboli. Cranial CT scan negative for acute changes. Echocardiogram with ejection fraction of 60%. Systolic function was normal. EEG negative. X-rays and imaging revealed osteomyelitis T10-T11 with epidural abscess. Infectious disease consulted for bacteremia due to MSSA and maintained on broad-spectrum antibiotics of intravenous ceftezole and with plan for 8 weeks duration ending May 22, 2018. Patient underwent laminectomy T10-T11 decompression of epidural abscess debridement of discitis T10-T11 with segmental fixation from T8-L3 03/28/2018 per Dr. Ellene Route. TLSO back brace applied in sitting position. Hospital course pain management. Physical therapy evaluation completed 03/29/2018 with recommendations of physical medicine rehab consult.  Past Medical History      Past Medical History:  Diagnosis Date  . Allergic rhinitis   . Arthritis   . Asthma    as a child  . B12 deficiency   . Cancer [name])    prostate  . Cervicogenic headache 01/28/2016  . Chronic back pain   . Coronary atherosclerosis of native coronary artery    Mild  nonobstructive CAD at catheterization January 2015  . Depression   . Essential hypertension, benign   . Falls   . GERD (gastroesophageal reflux disease)   . History of cerebrovascular disease 07/23/2015  . History of pneumonia 02/2011  . Hyperlipidemia   . Kidney stone   . Memory difficulty 07/23/2015  . OSA (obstructive sleep apnea)    CPAP - Dr. [name]  . Prostate cancer ([place])   . PTSD (post-traumatic stress disorder)    [place]  . Rectal bleeding     Family History  family history includes Asthma in his other; Breast cancer in his mother; CAD in his father; Colon cancer in his mother; Dementia in his paternal uncle; Emphysema in his father and maternal grandmother; Heart attack in his brother; Heart disease in his paternal grandfather; Heart failure in his father; Lung cancer in his father; Stroke in his maternal grandmother, mother, and sister.  Prior Rehab/Hospitalizations: No previous rehab  Has the patient had major surgery during 100 days prior to admission? Yes.  Daughter reports that patient had a cervical spine stimulator placed 01/19.  Current Medications   Current Facility-Administered Medications:  .  0.9 %  sodium chloride infusion, 250 mL, Intravenous, Continuous, Elsner, Henry, MD .  acetaminophen (TYLENOL) tablet 650 mg, 650 mg, Oral, Q4H PRN **OR** acetaminophen (TYLENOL) suppository 650 mg, 650 mg, Rectal, Q4H PRN, Kristeen Miss, MD .  albuterol (PROVENTIL) (2.5 MG/3ML) 0.083% nebulizer solution 2.5 mg, 2.5 mg, Nebulization, Q2H PRN, Kristeen Miss, MD .  alum & mag hydroxide-simeth (MAALOX/MYLANTA) 200-200-20 MG/5ML suspension 30 mL, 30 mL, Oral, Q6H PRN, Kristeen Miss, MD .  amLODipine (NORVASC) tablet 10 mg, 10 mg, Oral, Daily, Kristeen Miss, MD, 10 mg at 03/30/18  1013 .  aspirin EC tablet 81 mg, 81 mg, Oral, Daily, Kristeen Miss, MD, 81 mg at 03/30/18 1013 .  bisacodyl (DULCOLAX) EC tablet 5 mg, 5 mg, Oral, Daily PRN, Kristeen Miss, MD, 5 mg at  03/30/18 0842 .  bisacodyl (DULCOLAX) suppository 10 mg, 10 mg, Rectal, Daily PRN, Kristeen Miss, MD .  ceFAZolin (ANCEF) IVPB 2g/100 mL premix, 2 g, Intravenous, Q8H, Kristeen Miss, MD, Stopped at 03/30/18 1224 .  chlorhexidine (PERIDEX) 0.12 % solution 15 mL, 15 mL, Mouth Rinse, BID, Kristeen Miss, MD, 15 mL at 03/30/18 1224 .  cycloSPORINE (RESTASIS) 0.05 % ophthalmic emulsion 1 drop, 1 drop, Both Eyes, BID, Kristeen Miss, MD, 1 drop at 03/30/18 1032 .  docusate sodium (COLACE) capsule 100 mg, 100 mg, Oral, BID, Kristeen Miss, MD, 100 mg at 03/30/18 1013 .  donepezil (ARICEPT) tablet 10 mg, 10 mg, Oral, QHS, Kristeen Miss, MD, 10 mg at 03/29/18 2200 .  feeding supplement (ENSURE ENLIVE) (ENSURE ENLIVE) liquid 237 mL, 237 mL, Oral, BID BM, Kristeen Miss, MD, 237 mL at 03/30/18 1000 .  finasteride (PROSCAR) tablet 5 mg, 5 mg, Oral, Daily, Kristeen Miss, MD, 5 mg at 03/30/18 1014 .  fluticasone (FLONASE) 50 MCG/ACT nasal spray 1 spray, 1 spray, Each Nare, Daily PRN, Kristeen Miss, MD .  gabapentin (NEURONTIN) capsule 600 mg, 600 mg, Oral, TID, Kristeen Miss, MD, 600 mg at 03/30/18 1013 .  hydrALAZINE (APRESOLINE) injection 10 mg, 10 mg, Intravenous, Q6H PRN, Kristeen Miss, MD, 10 mg at 03/17/18 1355 .  HYDROmorphone (DILAUDID) injection 1 mg, 1 mg, Intravenous, Q3H PRN, Hall, Carole N, DO, 1 mg at 03/30/18 1011 .  ipratropium-albuterol (DUONEB) 0.5-2.5 (3) MG/3ML nebulizer solution 3 mL, 3 mL, Nebulization, BID, Kristeen Miss, MD, 3 mL at 03/30/18 0748 .  lactated ringers infusion, , Intravenous, Continuous, Kristeen Miss, MD, Last Rate: 125 mL/hr at 03/30/18 0715 .  latanoprost (XALATAN) 0.005 % ophthalmic solution 1 drop, 1 drop, Both Eyes, QHS, Kristeen Miss, MD, 1 drop at 03/29/18 2159 .  magnesium citrate solution 1 Bottle, 1 Bottle, Oral, Once, Rai, Ripudeep K, MD .  MEDLINE mouth rinse, 15 mL, Mouth Rinse, q12n4p, Kristeen Miss, MD, 15 mL at 03/28/18 1554 .  memantine (NAMENDA) tablet 10 mg,  10 mg, Oral, BID, Kristeen Miss, MD, 10 mg at 03/30/18 1014 .  menthol-cetylpyridinium (CEPACOL) lozenge 3 mg, 1 lozenge, Oral, PRN **OR** phenol (CHLORASEPTIC) mouth spray 1 spray, 1 spray, Mouth/Throat, PRN, Kristeen Miss, MD .  methocarbamol (ROBAXIN) tablet 500 mg, 500 mg, Oral, Q6H PRN, 500 mg at 03/30/18 0842 **OR** methocarbamol (ROBAXIN) 500 mg in dextrose 5 % 50 mL IVPB, 500 mg, Intravenous, Q6H PRN, Kristeen Miss, MD .  morphine 4 MG/ML injection 4 mg, 4 mg, Intravenous, Q2H PRN, Kristeen Miss, MD .  naloxone [facility]) injection 0.4 mg, 0.4 mg, Intravenous, PRN, Hall, Carole N, DO .  ondansetron (ZOFRAN) tablet 4 mg, 4 mg, Oral, Q6H PRN **OR** ondansetron (ZOFRAN) injection 4 mg, 4 mg, Intravenous, Q6H PRN, Kristeen Miss, MD .  oxyCODONE (Oxy IR/ROXICODONE) immediate release tablet 10 mg, 10 mg, Oral, Q3H PRN, Kristeen Miss, MD, 10 mg at 03/30/18 1220 .  oxyCODONE (Oxy IR/ROXICODONE) immediate release tablet 5 mg, 5 mg, Oral, Q3H PRN, Kristeen Miss, MD, 5 mg at 03/28/18 2149 .  oxyCODONE-acetaminophen (PERCOCET/ROXICET) 5-325 MG per tablet 2 tablet, 2 tablet, Oral, Q6H PRN, Kristeen Miss, MD, 2 tablet at 03/30/18 [phone]  pantoprazole (PROTONIX) EC tablet 40 mg, 40 mg, Oral, Daily, Elsner,  Mallie Mussel, MD, 40 mg at 03/30/18 1013 .  polyethylene glycol (MIRALAX / GLYCOLAX) packet 17 g, 17 g, Oral, Daily PRN, Kristeen Miss, MD .  pravastatin (PRAVACHOL) tablet 10 mg, 10 mg, Oral, QPM, Kristeen Miss, MD, 10 mg at 03/29/18 1725 .  rifampin (RIFADIN) capsule 300 mg, 300 mg, Oral, Q12H, Tommy Medal, Lavell Islam, MD, 300 mg at 03/30/18 1223 .  senna (SENOKOT) tablet 8.6 mg, 1 tablet, Oral, BID, Kristeen Miss, MD, 8.6 mg at 03/30/18 1013 .  sodium chloride flush (NS) 0.9 % injection 10-40 mL, 10-40 mL, Intracatheter, Q12H, Kristeen Miss, MD, 10 mL at 03/29/18 2201 .  sodium chloride flush (NS) 0.9 % injection 10-40 mL, 10-40 mL, Intracatheter, PRN, Kristeen Miss, MD, 20 mL at 03/24/18 0430 .  sodium chloride  flush (NS) 0.9 % injection 3 mL, 3 mL, Intravenous, Q12H, Kristeen Miss, MD, 3 mL at 03/29/18 2201 .  sodium chloride flush (NS) 0.9 % injection 3 mL, 3 mL, Intravenous, PRN, Kristeen Miss, MD .  sodium phosphate (FLEET) 7-19 GM/118ML enema 1 enema, 1 enema, Rectal, Once PRN, Kristeen Miss, MD .  traZODone (DESYREL) tablet 100 mg, 100 mg, Oral, QHS, Kristeen Miss, MD, 100 mg at 03/29/18 2158 .  venlafaxine XR (EFFEXOR-XR) 24 hr capsule 150 mg, 150 mg, Oral, BID, Kristeen Miss, MD, 150 mg at 03/30/18 1014 .  vitamin B-12 (CYANOCOBALAMIN) tablet 1,000 mcg, 1,000 mcg, Oral, Daily, Kristeen Miss, MD, 1,000 mcg at 03/30/18 1013  Patients Current Diet: Fall precautions Diet regular Room service appropriate? Yes; Fluid consistency: Thin  Precautions / Restrictions Precautions Precautions: Fall, Back Precaution Booklet Issued: No Precaution Comments: pt unable to recall precautions and educated for all  Spinal Brace: Thoracolumbosacral orthotic, Applied in sitting position Restrictions Weight Bearing Restrictions: No Other Position/Activity Restrictions: sit upright in chair for spinal protection   Has the patient had 2 or more falls or a fall with injury in the past year?Yes.  Daughter reports likely 5 falls in the last year.  Prior Activity Level Community (5-7x/wk): Went out daily, was driving.  Home Assistive Devices / Equipment Home Assistive Devices/Equipment: None Home Equipment: Cane - single point, Environmental consultant - 2 wheels  Prior Device Use: Indicate devices/aids used by the patient prior to current illness, exacerbation or injury? Cane  Prior Functional Level Prior Function Level of Independence: Independent  Self Care: Did the patient need help bathing, dressing, using the toilet or eating?  Independent  Indoor Mobility: Did the patient need assistance with walking from room to room (with or without device)? Independent  Stairs: Did the patient need assistance with  internal or external stairs (with or without device)? Independent  Functional Cognition: Did the patient need help planning regular tasks such as shopping or remembering to take medications? Independent  Current Functional Level Cognition  Overall Cognitive Status: Impaired/Different from baseline Orientation Level: Oriented X4 Following Commands: Follows one step commands inconsistently, Follows one step commands with increased time General Comments: Daughter (who is Therapist, sports at [employer] hospital) was present throughout session    Extremity Assessment (includes Sensation/Coordination)  Upper Extremity Assessment: Generalized weakness  Lower Extremity Assessment: Defer to PT evaluation RLE Deficits / Details: AAROM grossly WFL, strength hip flexion 2/5, knee extension 3-/5, ankle DF 3/5; sensation impaired to approx T9-10 RLE Sensation: decreased proprioception, decreased light touch LLE Deficits / Details: AAROM grossly WFL, strength hip flexion trace, knee extension 2/5, ankle DF 0/5; sensation impaired to approx T9-10 LLE Sensation: decreased proprioception, decreased light touch  ADLs  Overall ADL's : Needs assistance/impaired Eating/Feeding: Set up, Bed level Grooming: Set up, Bed level Upper Body Bathing: Moderate assistance, Bed level Lower Body Bathing: Maximal assistance, +2 for physical assistance, Bed level Upper Body Dressing : Moderate assistance, Bed level Lower Body Dressing: Maximal assistance, +2 for physical assistance, Bed level Toileting- Clothing Manipulation and Hygiene: Maximal assistance, +2 for physical assistance, Bed level Toileting - Clothing Manipulation Details (indicate cue type and reason): Pt rolling side to side with Max A and second person performing toilet hygiene with Max A Functional mobility during ADLs: Total assistance, +2 for physical assistance(with bari stedy) General ADL Comments: Pt requiring Max A for bathing, dressing, and toileting. Pt  unaware that he had BM during bed mobility and sit<>stand.     Mobility  Overal bed mobility: Needs Assistance Bed Mobility: Rolling, Sidelying to Sit Rolling: Max assist, +2 for physical assistance Sidelying to sit: Max assist, +2 for physical assistance Supine to sit: Mod assist Sit to supine: Mod assist Sit to sidelying: Max assist, +2 for physical assistance General bed mobility comments: assist to bend left knee, reach for rail and roll. ASsist to bring bil LE off of bed and elevate trunk, increased time    Transfers  Overall transfer level: Needs assistance Equipment used: Rolling walker (2 wheeled), 2 person hand held assist Transfer via [place]: Dawson, [employer] Transfers: Sit to/from Stand Sit to Stand: Max assist, +2 physical assistance, From elevated surface Stand pivot transfers: Min assist  Lateral/Scoot Transfers: Mod assist General transfer comment: attempted to stand x 3 trials from EOB with bari stedy and progressively increased bed height. Cues for sequence with assist for anterior translation and rise. Pt barely able to clear sacrum on last 2 trials but unable to extend trunk or hips to safely clear stedy flaps with return to EOb. EOB physical assist for balance as pt with anterior lean. [place] from EOB to chair with 3 person assist     Ambulation / Gait / Stairs / Wheelchair Mobility  Ambulation/Gait Ambulation/Gait assistance: Min assist, Mod assist Ambulation Distance (Feet): 5 Feet Assistive device: Rolling walker (2 wheeled) Gait Pattern/deviations: Decreased step length - right, Decreased step length - left, Decreased stride length General Gait Details: unable Gait velocity interpretation: Below normal speed for age/gender    Posture / Balance Dynamic Sitting Balance Sitting balance - Comments: requires min-mod assist and bil UE supports in sitting, anterior lean Balance Overall balance assessment: Needs assistance Sitting-balance support: Feet  supported, Bilateral upper extremity supported Sitting balance-Leahy Scale: Poor Sitting balance - Comments: requires min-mod assist and bil UE supports in sitting, anterior lean Standing balance support: Bilateral upper extremity supported, During functional activity Standing balance-Leahy Scale: Zero Standing balance comment: could not stand fully    Special needs/care consideration BiPAP/CPAP Yes, CPAP CPM No Continuous Drip IV LR 125 mL/hr Dialysis No      Life Vest No Oxygen No Special Bed No Trach Size No Wound Vac (area) N   Skin: Red area break down on buttock; has a TLSO brace                         Bowel mgmt: Last BM 03/26/18 with incontinence Bladder mgmt: Condom catheter Diabetic mgmt No    Previous Home Environment Living Arrangements: Non-relatives/Friends(girlfriend) Available Help at Discharge: Friend(s), Available 24 hours/day(girlfriend returns Apr20) Type of Home: House Home Layout: Two level, Able to live on main level with bedroom/bathroom Alternate Level Stairs-Rails:  Right Alternate Level Stairs-Number of Steps: 13 Home Access: Stairs to enter Entrance Stairs-Rails: Right, Left, Can reach both Entrance Stairs-Number of Steps: 7(family states working on ramp) Armed forces training and education officer: Yes [place]: No  Discharge Living Setting Plans for Discharge Living Setting: Patient's home, House, Lives with (comment)(Girlfriend lives with patient.) Type of Home at Discharge: House Discharge Home Layout: Two level, Laundry or work area in basement, Able to live on main level with bedroom/bathroom Alternate Level Stairs-Number of Steps: 7 steps, then landing, then 7 steps Discharge Home Access: Stairs to enter [employer] of Steps: About 6 steps per daughter [name]. Does the patient have any problems obtaining your medications?: No  Social/Family/Support Systems Patient Roles: Parent, Other (Comment)(Has a girlfriend  and 3 daughters.) Contact Information: Knute Neu - daughter - [phone] Anticipated Caregiver: Girlfriend and daughters. Ability/Limitations of Caregiver: Girlfriend does not work and is retired.  Daughters work. Caregiver Availability: 24/7 Discharge Plan Discussed with Primary Caregiver: Yes Is Caregiver In Agreement with Plan?: Yes Does Caregiver/Family have Issues with Lodging/Transportation while Pt is in Rehab?: No  Goals/Additional Needs Patient/Family Goal for Rehab: PT/OT mod I to supervision to min assist goals Expected length of stay: 18-24 days Cultural Considerations: None Dietary Needs: Regular diet, thin liquids Equipment Needs: TBD Pt/Family Agrees to Admission and willing to participate: Yes(I spoke with his daughter, [name].) Program Orientation Provided & Reviewed with Pt/Caregiver Including Roles  & Responsibilities: Yes  Decrease burden of Care through IP rehab admission: N/A  Possible need for SNF placement upon discharge: Not anticipated  Patient Condition: This patient's condition remains as documented in the consult dated 03/30/18, in which the Rehabilitation Physician determined and documented that the patient's condition is appropriate for intensive rehabilitative care in an inpatient rehabilitation facility. Will admit to inpatient rehab today.  Preadmission Screen Completed By:  Retta Diones, 03/30/2018 12:27 PM ______________________________________________________________________   Discussed status with Dr. Letta Pate on 03/30/18 at 1227 and received telephone approval for admission today.  Admission Coordinator:  Retta Diones, time 1227/Date 03/30/18             Cosigned by: Charlett Blake, MD at 03/30/2018 12:49 PM  Revision History

## 2018-03-30 NOTE — H&P (Addendum)
Physical Medicine and Rehabilitation Admission H&P  Cody Velez is an 72 y.o. male.   No chief complaint on file. : HPI: Cody Velez is a 72 y.o. right-handed male with history of hypertension, memory deficits maintained on Aricept and Namenda, prostate cancer, PTSD, chronic back pain with multiple lumbar surgeries with recent spinal cord stimulator placement January 2019 as well as treatment for recent UTI.   Per chart review patient lives with girlfriend.  Independent with a cane prior to admission and still driving.  2 level home with bedroom on main and 7 steps to entry with plan for ramp.  Girlfriend can assist as needed however she is currently on vacation in [place] until April 20.  Presented 03/16/2018 with lethargy/fall as well as hypoxemia with paraparesis.  CT Angio of the chest negative for pulmonary emboli.  Cranial CT scan negative for acute changes.  Echocardiogram with ejection fraction of 60%.  Systolic function was normal.  EEG negative.  X-rays and imaging revealed osteomyelitis T10-T11 with epidural abscess.  Infectious disease consulted for bacteremia due to MSSA and maintained on broad-spectrum antibiotics of intravenous ceftezole and with plan for 8 weeks duration ending May 22, 2018 as well as rifampin 300 mg twice daily.  Patient underwent laminectomy T10-T11 decompression of epidural abscess debridement of discitis T10-T11 with segmental fixation from T8-L3 03/28/2018 per Dr. Ellene Route.  TLSO back brace applied in sitting position.  Hospital course pain management.  Physical therapy evaluation completed 03/29/2018 with recommendations of physical medicine rehab consult .  Patient was admitted for a comprehensive rehab program    Review of Systems -General: Positive for malaise, HEENT negative for hearing loss and tinnitus, eyes negative for blurred vision and double vision, respiratory positive for shortness of breath, cardiovascular positive for leg swelling negative for chest  pain, gastrointestinal positive for constipation negative for nausea, genitourinary positive for urgency negative for dysuria flank pain hematuria, musculoskeletal positive for chronic back pain falls and joint pain, neurologic follow positive for sensory change negative for seizures, psychiatric positive for depression and memory loss  Past Medical History:  Diagnosis Date  . Allergic rhinitis   . Arthritis   . Asthma    as a child  . B12 deficiency   . Cancer [facility])    prostate  . Cervicogenic headache 01/28/2016  . Chronic back pain   . Coronary atherosclerosis of native coronary artery    Mild nonobstructive CAD at catheterization January 2015  . Depression   . Essential hypertension, benign   . Falls   . GERD (gastroesophageal reflux disease)   . History of cerebrovascular disease 07/23/2015  . History of pneumonia 02/2011  . Hyperlipidemia   . Kidney stone   . Memory difficulty 07/23/2015  . OSA (obstructive sleep apnea)    CPAP - Dr. [name]  . Prostate cancer ([place])   . PTSD (post-traumatic stress disorder)    [place]  . Rectal bleeding    Past Surgical History:  Procedure Laterality Date  . BACK SURGERY  02/14/12   lumbar OR #7; "today redid L1L2; replaced screws; added bone from hip"  . BILATERAL KNEE ARTHROSCOPY    . COLONOSCOPY  10/15/2008   Dr. [name]: tubular adenoma   . COLONOSCOPY  12/17/2003   NOB:SJGGEZ rectal and colon  . COLONOSCOPY N/A 09/05/2014   Procedure: COLONOSCOPY;  Surgeon: Daneil Dolin, MD;  Location: [facility];  Service: Endoscopy;  Laterality: N/A;  7:30-rescheduled 9/17 to [place] notified pt  .  CYSTOSCOPY WITH STENT PLACEMENT Right 01/27/2016   Procedure: CYSTOSCOPY WITH STENT PLACEMENT;  Surgeon: [name], MD;  Location: [facility] ORS;  Service: Urology;  Laterality: Right;  . CYSTOSCOPY/RETROGRADE/URETEROSCOPY/STONE EXTRACTION WITH BASKET Right 01/27/2016   Procedure: CYSTOSCOPY, RIGHT RETROGRADE, RIGHT URETEROSCOPY, STONE EXTRACTION ;   Surgeon: [name], MD;  Location: [facility] OR[facility];  Service: Urology;  Laterality: Right;  . ESOPHAGOGASTRODUODENOSCOPY  10/15/2008     Dr Rourk:Schatzki's ring status post dilation and disruption via 22 F Maloney dilator/ otherwise unremarkable esophagus, small hiatal hernia, multiple fundal gland polyps not manipulated, gastritis, negative H.pylori  . ESOPHAGOGASTRODUODENOSCOPY  06/21/02   NIO:EVOJJ sliding hiatal hernia with mild changes of reflux esophagitis limited to gastroesophageal junction.  Noncritical ring at distal esophagus, 3 cm proximal to gastroesophageal junction/Antral gastritis  . [place]   "broke face playing softball"  . FRACTURE SURGERY     "left wrist; broke it; took spur off"  . HOLMIUM LASER APPLICATION Right 0/0/9381   Procedure: HOLMIUM LASER APPLICATION;  Surgeon: [name], MD;  Location: [facility] OR[facility];  Service: Urology;  Laterality: Right;  . LEFT HEART CATHETERIZATION WITH CORONARY ANGIOGRAM N/A 12/27/2013   Procedure: LEFT HEART CATHETERIZATION WITH CORONARY ANGIOGRAM;  Surgeon: [name], MD;  Location: [facility] CATH LAB;  Service: Cardiovascular;  Laterality: N/A;  . neck epidural    . SHOULDER SURGERY Bilateral   . TEE WITHOUT CARDIOVERSION N/A 03/21/2018   Procedure: TRANSESOPHAGEAL ECHOCARDIOGRAM (TEE) WITH PROPOFOL;  Surgeon: [name], MD;  Location: [facility];  Service: Cardiovascular;  Laterality: N/A;   Family History  Problem Relation Age of Onset  . Emphysema Father   . Heart failure Father   . Lung cancer Father   . CAD Father   . Colon cancer Mother   . Stroke Mother   . Breast cancer Mother   . Stroke Sister   . Heart attack Brother   . Dementia Paternal Uncle   . Emphysema Maternal Grandmother   . Stroke Maternal Grandmother   . Asthma Other        grandson  . Heart disease Paternal Grandfather   . Anesthesia problems Neg Hx   . Hypotension Neg Hx   . Malignant hyperthermia Neg Hx   . Pseudochol  deficiency Neg Hx    Social History:  reports that he quit smoking about 59 years ago. His smoking use included cigarettes. His smokeless tobacco use includes chew. He reports that he drinks alcohol. He reports that he does not use drugs. Allergies: No Known Allergies Medications Prior to Admission  Medication Sig Dispense Refill  . amLODipine (NORVASC) 10 MG tablet Take 10 mg by mouth daily.    Marland Kitchen aspirin 81 MG tablet Take 81 mg by mouth daily.    . baclofen (LIORESAL) 10 MG tablet Take 10 mg by mouth 3 (three) times daily.    . cycloSPORINE (RESTASIS) 0.05 % ophthalmic emulsion 1 DROP INTO BOTH EYES TWICE A DAY FOR DRY EYES    . docusate sodium (COLACE) 100 MG capsule Take 200 mg by mouth 2 (two) times daily.    Marland Kitchen donepezil (ARICEPT) 10 MG tablet Take 1 tablet (10 mg total) by mouth at bedtime. 90 tablet 3  . finasteride (PROSCAR) 5 MG tablet Take 5 mg by mouth daily. Reported on 05/04/2016    . fluticasone (FLONASE) 50 MCG/ACT nasal spray Place 1 spray into both nostrils daily as needed for allergies.     Marland Kitchen gabapentin (NEURONTIN) 300  MG capsule Take 3 capsules (900 mg total) by mouth 3 (three) times daily. 810 capsule 3  . latanoprost (XALATAN) 0.005 % ophthalmic solution Place 1 drop into both eyes at bedtime.     Marland Kitchen losartan (COZAAR) 100 MG tablet Take 100 mg by mouth every morning.     . memantine (NAMENDA) 10 MG tablet Take 1 tablet (10 mg total) by mouth 2 (two) times daily. 180 tablet 3  . omeprazole (PRILOSEC) 20 MG capsule Take 40 mg by mouth daily.     Marland Kitchen oxyCODONE-acetaminophen (PERCOCET/ROXICET) 5-325 MG tablet Take 2 tablets by mouth every 6 (six) hours as needed for severe pain.    . pravastatin (PRAVACHOL) 10 MG tablet Take 10 mg by mouth every evening.     . rifampin (RIFADIN) 300 MG capsule Take 1 capsule (300 mg total) by mouth every 12 (twelve) hours.    . sildenafil (VIAGRA) 100 MG tablet Take 100 mg by mouth daily as needed for erectile dysfunction.    . traZODone (DESYREL)  100 MG tablet Take 100 mg by mouth at bedtime.    Marland Kitchen venlafaxine XR (EFFEXOR-XR) 150 MG 24 hr capsule Take 150 mg by mouth 2 (two) times daily.     . vitamin B-12 (CYANOCOBALAMIN) 1000 MCG tablet Take 1,000 mcg by mouth daily.    Marland Kitchen ceFAZolin (ANCEF) IVPB Inject 2 g into the vein every 8 (eight) hours. Indication:  MSSA bacteremia and extensive epidural abscess, discitis of thoracic spine Last Day of Therapy:  05/22/18 Labs - Once weekly:  CBC/D and BMP, Labs - Every other week:  ESR and CRP 162 Units 0  Home and Hospital meds reviewed no change in current regimen  Home:     Functional History:    Functional Status:  Mobility:          ADL:    Cognition: Cognition Orientation Level: Oriented X4     Blood pressure 127/73, pulse 88, temperature 98.7 F (37.1 C), temperature source Oral, height '6\' 3"'  (1.905 m), weight 129.7 kg (285 lb 15 oz), SpO2 100 %. Physical Exam  General: No acute distress Mood and affect are appropriate Heart: Regular rate and rhythm no rubs murmurs or extra sounds Lungs: Clear to auscultation, breathing unlabored, no rales or wheezes Abdomen: Positive bowel sounds, soft nontender to palpation, mildly distended Extremities: No clubbing, cyanosis, or edema Skin: No evidence of breakdown, no evidence of rash Neurologic: Cranial nerves II through XII intact, motor strength is 5/5 in bilateral deltoid, bicep, tricep, grip, 3- right and 2- left hip flexor, knee extensors, ankle dorsiflexor and plantar flexor Sensory exam normal sensation to light touch and absent proprioception in bilateral lower extremities Cerebellar exam normal finger to nose to finger lower extremity testing not attempted secondary to weakness musculoskeletal: Full passive range of motion in all 4 extremities. No joint swelling Results for orders placed or performed during the hospital encounter of 03/15/18 (from the past 48 hour(s))  CBC     Status: Abnormal   Collection Time: 03/29/18   4:39 AM  Result Value Ref Range   WBC 12.8 (H) 4.0 - 10.5 K/uL   RBC 3.45 (L) 4.22 - 5.81 MIL/uL   Hemoglobin 8.9 (L) 13.0 - 17.0 g/dL   HCT 29.6 (L) 39.0 - 52.0 %   MCV 85.8 78.0 - 100.0 fL   MCH 25.8 (L) 26.0 - 34.0 pg   MCHC 30.1 30.0 - 36.0 g/dL   RDW 16.7 (H) 11.5 - 15.5 %  Platelets 619 (H) 150 - 400 K/uL    Comment: Performed at [facility], [place] [address], [place], [place] [address]  Comprehensive metabolic panel     Status: Abnormal   Collection Time: 03/29/18  4:39 AM  Result Value Ref Range   Sodium 131 (L) 135 - 145 mmol/L   Potassium 4.7 3.5 - 5.1 mmol/L   Chloride 97 (L) 101 - 111 mmol/L   CO2 25 22 - 32 mmol/L   Glucose, Bld 117 (H) 65 - 99 mg/dL   BUN 19 6 - 20 mg/dL   Creatinine, Ser 1.03 0.61 - 1.24 mg/dL   Calcium 8.7 (L) 8.9 - 10.3 mg/dL   Total Protein 6.7 6.5 - 8.1 g/dL   Albumin 2.3 (L) 3.5 - 5.0 g/dL   AST 43 (H) 15 - 41 U/L   ALT 17 17 - 63 U/L   Alkaline Phosphatase 83 38 - 126 U/L   Total Bilirubin 0.4 0.3 - 1.2 mg/dL   GFR calc non Af Amer >60 >60 mL/min   GFR calc Af Amer >60 >60 mL/min    Comment: (NOTE) The eGFR has been calculated using the CKD EPI equation. This calculation has not been validated in all clinical situations. eGFR's persistently <60 mL/min signify possible Chronic Kidney Disease.    Anion gap 9 5 - 15    Comment: Performed at [facility] [address], [place], [place] [address]   No results found. Medical Problem List and Plan: 1.  Decreased functional mobility secondary to T10-11 osteomyelitis/epidural abscess with subsequent myelopathy/paraplegia, status post decompression and fusion T8-L3.  Back brace when out of bed.  Patient with history of multiple lumbar surgeries as well as spinal cord stimulator January 2019 2.  DVT Prophylaxis/Anticoagulation: SCDs.  Check vascular study 3. Pain Management: Neurontin 600 mg 3 times daily, Robaxin and oxycodone as needed 4. Mood: Aricept 10 mg nightly, Namenda 10  mg twice daily, Effexor sore 150 mg twice daily, trazodone 100 mg nightly 5. Neuropsych: This patient is capable of making decisions on his own behalf. 6. Skin/Wound Care: Routine skin checks 7. Fluids/Electrolytes/Nutrition: Routine INO's with follow-up chemistries 8.  ID/MSSA bacteremia.  Intravenous Ancef 2 g every 8 hours through 05/22/2018 and stop as well as rifampin 300 mg twice daily.  Follow-up per infectious disease 9.  Acute on chronic anemia.  Latest hemoglobin 8.9.  Follow-up chemistries 10.  Hypertension.  Norvasc 10 mg daily 11.  Hyperlipidemia.  Pravachol 12.  History of prostate cancer.  Proscar 5 mg daily 13.  Constipation.  Laxative assistance  Post Admission Physician Evaluation: 1. Functional deficits secondary  to paraplegia secondary to osteomyelitis and epidural abscess at T10/T11 status post decompression and fusion T8-L3. 2. Patient admitted to receive collaborative, interdisciplinary care between the physiatrist, rehab nursing staff, and therapy team. 3. Patient's level of medical complexity and substantial therapy needs in context of that medical necessity cannot be provided at a lesser intensity of care. 4. Patient has experienced substantial functional loss from his/her baseline. .  Judging by the patient's diagnosis, physical exam, and functional history, the patient has potential for functional progress which will result in measurable gains while on inpatient rehab.  These gains will be of substantial and practical use upon discharge in facilitating mobility and self-care at the household level. 5. Physiatrist will provide 24 hour management of medical needs as well as oversight of the therapy plan/treatment and provide guidance as appropriate regarding the interaction of the  two. 6. 24 hour rehab nursing will assist in the management of  bladder management, bowel management, safety, skin/wound care, disease management, medication administration, pain management and  patient education  and help integrate therapy concepts, techniques,education, etc. 7. PT will assess and treat for:pre gait, gait training, endurance , safety, equipment, neuromuscular re education  .  Goals are: independent with assistive device. 8. OT will assess and treat for  ADLs, Cognitive perceptual skills, Neuromuscular re education, safety, endurance, equipment  .  Goals are: contact guard.  9. SLP will assess and treat forNA  .  Goals are: N/A. 10. Case Management and Social Worker will assess and treat for psychological issues and discharge planning. 11. Team conference will be held weekly to assess progress toward goals and to determine barriers to discharge. 12.  Patient will receive at least 3 hours of therapy per day at least 5 days per week. 13. ELOS and Prognosis: 16-19d excellent    Silvestre Mesi PA-C  Patient  seen by attending physician, reviewed documentation, examined patient. Luanna Salk Vilda Zollner 03/30/2018, 5:27 PM

## 2018-03-30 NOTE — Progress Notes (Signed)
Physical Therapy Treatment Patient Details Name: Cody Velez MRN: [id number] DOB: 10/12/1946 Today's Date: 03/30/2018    History of Present Illness Cody Velez is a 72 y.o. male admitted for weakness and fall. Pt with osteo and epidural abscess T10-11 s/p lami and fixation T8-L3. Pt with significant bil LE weakness post op. PMHx: spinal cord stimulator placement , nephrolithiasis, UTI, HTN, dyslipidemia, morbid obesity, and CAD    PT Comments    Pt with lack of sensation bil feet, right thigh in L2 distribution, decreased strength with bil dorsiflexion 3/5, right hip flexion 2/5, R knee extension 2+/5, L hip flexion 2/5, left knee extension 2/5. Pt unable to engage bil LE to achieve standing and required maxisky from bed to chair today. Pt educated for bil LE exercises to work on strengthening to increase function as well as back precautions and progression. Total +2 assist to don brace in sitting.     Follow Up Recommendations  CIR;Supervision/Assistance - 24 hour     Equipment Recommendations  Wheelchair (measurements PT);Wheelchair cushion (measurements PT);3in1 (PT)    Recommendations for Other Services       Precautions / Restrictions Precautions Precautions: Fall;Back Precaution Comments: pt unable to recall precautions and educated for all  Required Braces or Orthoses: Spinal Brace Spinal Brace: Thoracolumbosacral orthotic;Applied in sitting position Restrictions Weight Bearing Restrictions: No    Mobility  Bed Mobility Overal bed mobility: Needs Assistance Bed Mobility: Rolling;Sidelying to Sit Rolling: Max assist;+2 for physical assistance Sidelying to sit: Max assist;+2 for physical assistance       General bed mobility comments: assist to bend left knee, reach for rail and roll. ASsist to bring bil LE off of bed and elevate trunk, increased time  Transfers Overall transfer level: Needs assistance   Transfers: Sit to/from Stand Sit to Stand: Max  assist;+2 physical assistance;From elevated surface         General transfer comment: attempted to stand x 3 trials from EOB with bari stedy and progressively increased bed height. Cues for sequence with assist for anterior translation and rise. Pt barely able to clear sacrum on last 2 trials but unable to extend trunk or hips to safely clear stedy flaps with return to EOb. EOB physical assist for balance as pt with anterior lean. Maxisky from EOB to chair with 3 person assist   Ambulation/Gait             General Gait Details: unable   Stairs            Wheelchair Mobility    Modified Rankin (Stroke Patients Only)       Balance Overall balance assessment: Needs assistance   Sitting balance-Leahy Scale: Poor Sitting balance - Comments: requires min-mod assist and bil UE supports in sitting, anterior lean                                    Cognition Arousal/Alertness: Awake/alert Behavior During Therapy: WFL for tasks assessed/performed Overall Cognitive Status: Impaired/Different from baseline Area of Impairment: Memory;Following commands                     Memory: Decreased recall of precautions Following Commands: Follows one step commands inconsistently;Follows one step commands with increased time              Exercises General Exercises - Lower Extremity Short Arc Quad: AAROM;5 reps;Seated;Both Hip Flexion/Marching: AAROM;5 reps;Seated;Both Toe  Raises: AROM;5 reps;Seated;Both    General Comments        Pertinent Vitals/Pain Pain Score: 8  Pain Location: back Pain Descriptors / Indicators: Aching;Constant Pain Intervention(s): Premedicated before session;Monitored during session;Limited activity within patient's tolerance;Repositioned    Home Living                      Prior Function            PT Goals (current goals can now be found in the care plan section) Progress towards PT goals: Not progressing  toward goals - comment(pt severely limited by bil LE weakness)    Frequency    Min 5X/week      PT Plan      Co-evaluation              AM-PAC PT "6 Clicks" Daily Activity  Outcome Measure  Difficulty turning over in bed (including adjusting bedclothes, sheets and blankets)?: Unable Difficulty moving from lying on back to sitting on the side of the bed? : Unable Difficulty sitting down on and standing up from a chair with arms (e.g., wheelchair, bedside commode, etc,.)?: Unable Help needed moving to and from a bed to chair (including a wheelchair)?: Total Help needed walking in hospital room?: Total Help needed climbing 3-5 steps with a railing? : Total 6 Click Score: 6    End of Session Equipment Utilized During Treatment: Gait belt;Back brace Activity Tolerance: Patient limited by fatigue Patient left: in chair;with call bell/phone within reach;with nursing/sitter in room Nurse Communication: Precautions;Mobility status;Need for lift equipment PT Visit Diagnosis: Muscle weakness (generalized) (M62.81);Other symptoms and signs involving the nervous system (R29.898);Other abnormalities of gait and mobility (R26.89);History of falling (Z91.81)     Time: 1610-9604 PT Time Calculation (min) (ACUTE ONLY): 33 min  Charges:  $Therapeutic Activity: 23-37 mins                    G Codes:       Elwyn Reach, PT [phone]    Idabell Picking B Hayden Kihara 03/30/2018, 10:23 AM

## 2018-03-30 NOTE — PMR Pre-admission (Signed)
PMR Admission Coordinator Pre-Admission Assessment  Patient: Cody Velez is an 72 y.o., male MRN: [id number] DOB: [birth year] Height: 6\' 3"  (190.5 cm) Weight: 130.4 kg (287 lb 7.7 oz)              Insurance Information HMO: No   PPO:       PCP:       IPA:       80/20:       OTHER:   PRIMARY:  Medicare A and B      Policy#: [id number]      Subscriber: patient CM Name:        Phone#:       Fax#:   Pre-Cert#:        Employer:  Retired Benefits:  Phone #:       Name: Checked in passport one source [employer] Date: A=08/21/11 and B=06/19/17     Deduct: $1364      Out of Pocket Max: none      Life Max: N/A CIR: 100%      SNF: 100 days Outpatient: 80%     Co-Pay: 20% Home Health: 100%      Co-Pay: none DME: 80%     Co-Pay: 20% Providers: patient's choice  SECONDARY: BCBS [employer] emp PPO      Policy#: [id number]      Subscriber: patient CM Name:        Phone#:       Fax#:   Pre-Cert#:        Employer: Retired Benefits:  Phone #: [phone]     Name:   Eff. Date:       Deduct:        Out of Pocket Max:        Life Max:   CIR:        SNF:   Outpatient:       Co-Pay:   Home Health:        Co-Pay:   DME:       Co-Pay:    Emergency Contact Information Contact Information    Name Relation Home Work Mobile   Booker,[name] Daughter [phone]  [phone]   Smith,Stephanie Daughter [phone]  [phone]   Davis,Christine Daughter   [phone]   Monika Salk Significant other [phone]  [phone]     Current Medical History  Patient Admitting Diagnosis:T10-11 osteomyelitis with subsequent myelopathy/paraplegia, s/p decompression and fusion T8-L3  History of Present Illness:  A 72 y.o. right-handed male with history of hypertension, memory deficits maintained on Aricept and Namenda, prostate cancer, PTSD, chronic back pain with multiple lumbar surgeries with recent spinal cord stimulator placement January 2019 as well as treatment for recent UTI.   Per chart review patient lives with  girlfriend.  Independent with a cane prior to admission and still driving.  2 level home with bedroom on main and 7 steps to entry with plan for ramp.  Girlfriend can assist as needed however she is currently on vacation in [place] until April 20.  Presented 03/16/2018 with lethargy/fall as well as hypoxemia with paraparesis.  CT Angio of the chest negative for pulmonary emboli.  Cranial CT scan negative for acute changes.  Echocardiogram with ejection fraction of 60%.  Systolic function was normal.  EEG negative.  X-rays and imaging revealed osteomyelitis T10-T11 with epidural abscess.  Infectious disease consulted for bacteremia due to MSSA and maintained on broad-spectrum antibiotics of intravenous ceftezole and with plan for 8 weeks duration ending May 22, 2018.  Patient underwent  laminectomy T10-T11 decompression of epidural abscess debridement of discitis T10-T11 with segmental fixation from T8-L3 03/28/2018 per Dr. Ellene Route.  TLSO back brace applied in sitting position.  Hospital course pain management.  Physical therapy evaluation completed 03/29/2018 with recommendations of physical medicine rehab consult.  Past Medical History  Past Medical History:  Diagnosis Date  . Allergic rhinitis   . Arthritis   . Asthma    as a child  . B12 deficiency   . Cancer [facility])    prostate  . Cervicogenic headache 01/28/2016  . Chronic back pain   . Coronary atherosclerosis of native coronary artery    Mild nonobstructive CAD at catheterization January 2015  . Depression   . Essential hypertension, benign   . Falls   . GERD (gastroesophageal reflux disease)   . History of cerebrovascular disease 07/23/2015  . History of pneumonia 02/2011  . Hyperlipidemia   . Kidney stone   . Memory difficulty 07/23/2015  . OSA (obstructive sleep apnea)    CPAP - Dr. [name]  . Prostate cancer ([place])   . PTSD (post-traumatic stress disorder)    [place]  . Rectal bleeding     Family History  family history includes Asthma in  his other; Breast cancer in his mother; CAD in his father; Colon cancer in his mother; Dementia in his paternal uncle; Emphysema in his father and maternal grandmother; Heart attack in his brother; Heart disease in his paternal grandfather; Heart failure in his father; Lung cancer in his father; Stroke in his maternal grandmother, mother, and sister.  Prior Rehab/Hospitalizations: No previous rehab  Has the patient had major surgery during 100 days prior to admission? Yes.  Daughter reports that patient had a cervical spine stimulator placed 01/19.  Current Medications   Current Facility-Administered Medications:  .  0.9 %  sodium chloride infusion, 250 mL, Intravenous, Continuous, Elsner, Henry, MD .  acetaminophen (TYLENOL) tablet 650 mg, 650 mg, Oral, Q4H PRN **OR** acetaminophen (TYLENOL) suppository 650 mg, 650 mg, Rectal, Q4H PRN, Kristeen Miss, MD .  albuterol (PROVENTIL) (2.5 MG/3ML) 0.083% nebulizer solution 2.5 mg, 2.5 mg, Nebulization, Q2H PRN, Kristeen Miss, MD .  alum & mag hydroxide-simeth (MAALOX/MYLANTA) 200-200-20 MG/5ML suspension 30 mL, 30 mL, Oral, Q6H PRN, Kristeen Miss, MD .  amLODipine (NORVASC) tablet 10 mg, 10 mg, Oral, Daily, Kristeen Miss, MD, 10 mg at 03/30/18 1013 .  aspirin EC tablet 81 mg, 81 mg, Oral, Daily, Kristeen Miss, MD, 81 mg at 03/30/18 1013 .  bisacodyl (DULCOLAX) EC tablet 5 mg, 5 mg, Oral, Daily PRN, Kristeen Miss, MD, 5 mg at 03/30/18 0842 .  bisacodyl (DULCOLAX) suppository 10 mg, 10 mg, Rectal, Daily PRN, Kristeen Miss, MD .  ceFAZolin (ANCEF) IVPB 2g/100 mL premix, 2 g, Intravenous, Q8H, Kristeen Miss, MD, Stopped at 03/30/18 1224 .  chlorhexidine (PERIDEX) 0.12 % solution 15 mL, 15 mL, Mouth Rinse, BID, Kristeen Miss, MD, 15 mL at 03/30/18 1224 .  cycloSPORINE (RESTASIS) 0.05 % ophthalmic emulsion 1 drop, 1 drop, Both Eyes, BID, Kristeen Miss, MD, 1 drop at 03/30/18 1032 .  docusate sodium (COLACE) capsule 100 mg, 100 mg, Oral, BID, Kristeen Miss,  MD, 100 mg at 03/30/18 1013 .  donepezil (ARICEPT) tablet 10 mg, 10 mg, Oral, QHS, Kristeen Miss, MD, 10 mg at 03/29/18 2200 .  feeding supplement (ENSURE ENLIVE) (ENSURE ENLIVE) liquid 237 mL, 237 mL, Oral, BID BM, Kristeen Miss, MD, 237 mL at 03/30/18 1000 .  finasteride (PROSCAR) tablet 5 mg, 5 mg, Oral,  Daily, Kristeen Miss, MD, 5 mg at 03/30/18 1014 .  fluticasone (FLONASE) 50 MCG/ACT nasal spray 1 spray, 1 spray, Each Nare, Daily PRN, Kristeen Miss, MD .  gabapentin (NEURONTIN) capsule 600 mg, 600 mg, Oral, TID, Kristeen Miss, MD, 600 mg at 03/30/18 1013 .  hydrALAZINE (APRESOLINE) injection 10 mg, 10 mg, Intravenous, Q6H PRN, Kristeen Miss, MD, 10 mg at 03/17/18 1355 .  HYDROmorphone (DILAUDID) injection 1 mg, 1 mg, Intravenous, Q3H PRN, Hall, Carole N, DO, 1 mg at 03/30/18 1011 .  ipratropium-albuterol (DUONEB) 0.5-2.5 (3) MG/3ML nebulizer solution 3 mL, 3 mL, Nebulization, BID, Kristeen Miss, MD, 3 mL at 03/30/18 0748 .  lactated ringers infusion, , Intravenous, Continuous, Kristeen Miss, MD, Last Rate: 125 mL/hr at 03/30/18 0715 .  latanoprost (XALATAN) 0.005 % ophthalmic solution 1 drop, 1 drop, Both Eyes, QHS, Kristeen Miss, MD, 1 drop at 03/29/18 2159 .  magnesium citrate solution 1 Bottle, 1 Bottle, Oral, Once, Rai, Ripudeep K, MD .  MEDLINE mouth rinse, 15 mL, Mouth Rinse, q12n4p, Kristeen Miss, MD, 15 mL at 03/28/18 1554 .  memantine (NAMENDA) tablet 10 mg, 10 mg, Oral, BID, Kristeen Miss, MD, 10 mg at 03/30/18 1014 .  menthol-cetylpyridinium (CEPACOL) lozenge 3 mg, 1 lozenge, Oral, PRN **OR** phenol (CHLORASEPTIC) mouth spray 1 spray, 1 spray, Mouth/Throat, PRN, Kristeen Miss, MD .  methocarbamol (ROBAXIN) tablet 500 mg, 500 mg, Oral, Q6H PRN, 500 mg at 03/30/18 0842 **OR** methocarbamol (ROBAXIN) 500 mg in dextrose 5 % 50 mL IVPB, 500 mg, Intravenous, Q6H PRN, Kristeen Miss, MD .  morphine 4 MG/ML injection 4 mg, 4 mg, Intravenous, Q2H PRN, Kristeen Miss, MD .  naloxone [facility])  injection 0.4 mg, 0.4 mg, Intravenous, PRN, Hall, Carole N, DO .  ondansetron (ZOFRAN) tablet 4 mg, 4 mg, Oral, Q6H PRN **OR** ondansetron (ZOFRAN) injection 4 mg, 4 mg, Intravenous, Q6H PRN, Kristeen Miss, MD .  oxyCODONE (Oxy IR/ROXICODONE) immediate release tablet 10 mg, 10 mg, Oral, Q3H PRN, Kristeen Miss, MD, 10 mg at 03/30/18 1220 .  oxyCODONE (Oxy IR/ROXICODONE) immediate release tablet 5 mg, 5 mg, Oral, Q3H PRN, Kristeen Miss, MD, 5 mg at 03/28/18 2149 .  oxyCODONE-acetaminophen (PERCOCET/ROXICET) 5-325 MG per tablet 2 tablet, 2 tablet, Oral, Q6H PRN, Kristeen Miss, MD, 2 tablet at 03/30/18 [phone] .  pantoprazole (PROTONIX) EC tablet 40 mg, 40 mg, Oral, Daily, Kristeen Miss, MD, 40 mg at 03/30/18 1013 .  polyethylene glycol (MIRALAX / GLYCOLAX) packet 17 g, 17 g, Oral, Daily PRN, Kristeen Miss, MD .  pravastatin (PRAVACHOL) tablet 10 mg, 10 mg, Oral, QPM, Kristeen Miss, MD, 10 mg at 03/29/18 1725 .  rifampin (RIFADIN) capsule 300 mg, 300 mg, Oral, Q12H, Tommy Medal, Lavell Islam, MD, 300 mg at 03/30/18 1223 .  senna (SENOKOT) tablet 8.6 mg, 1 tablet, Oral, BID, Kristeen Miss, MD, 8.6 mg at 03/30/18 1013 .  sodium chloride flush (NS) 0.9 % injection 10-40 mL, 10-40 mL, Intracatheter, Q12H, Kristeen Miss, MD, 10 mL at 03/29/18 2201 .  sodium chloride flush (NS) 0.9 % injection 10-40 mL, 10-40 mL, Intracatheter, PRN, Kristeen Miss, MD, 20 mL at 03/24/18 0430 .  sodium chloride flush (NS) 0.9 % injection 3 mL, 3 mL, Intravenous, Q12H, Kristeen Miss, MD, 3 mL at 03/29/18 2201 .  sodium chloride flush (NS) 0.9 % injection 3 mL, 3 mL, Intravenous, PRN, Kristeen Miss, MD .  sodium phosphate (FLEET) 7-19 GM/118ML enema 1 enema, 1 enema, Rectal, Once PRN, Kristeen Miss, MD .  traZODone (DESYREL) tablet  100 mg, 100 mg, Oral, QHS, Kristeen Miss, MD, 100 mg at 03/29/18 2158 .  venlafaxine XR (EFFEXOR-XR) 24 hr capsule 150 mg, 150 mg, Oral, BID, Kristeen Miss, MD, 150 mg at 03/30/18 1014 .  vitamin B-12  (CYANOCOBALAMIN) tablet 1,000 mcg, 1,000 mcg, Oral, Daily, Kristeen Miss, MD, 1,000 mcg at 03/30/18 1013  Patients Current Diet: Fall precautions Diet regular Room service appropriate? Yes; Fluid consistency: Thin  Precautions / Restrictions Precautions Precautions: Fall, Back Precaution Booklet Issued: No Precaution Comments: pt unable to recall precautions and educated for all  Spinal Brace: Thoracolumbosacral orthotic, Applied in sitting position Restrictions Weight Bearing Restrictions: No Other Position/Activity Restrictions: sit upright in chair for spinal protection   Has the patient had 2 or more falls or a fall with injury in the past year?Yes.  Daughter reports likely 5 falls in the last year.  Prior Activity Level Community (5-7x/wk): Went out daily, was driving.  Home Assistive Devices / Equipment Home Assistive Devices/Equipment: None Home Equipment: Cane - single point, Environmental consultant - 2 wheels  Prior Device Use: Indicate devices/aids used by the patient prior to current illness, exacerbation or injury? Cane  Prior Functional Level Prior Function Level of Independence: Independent  Self Care: Did the patient need help bathing, dressing, using the toilet or eating?  Independent  Indoor Mobility: Did the patient need assistance with walking from room to room (with or without device)? Independent  Stairs: Did the patient need assistance with internal or external stairs (with or without device)? Independent  Functional Cognition: Did the patient need help planning regular tasks such as shopping or remembering to take medications? Independent  Current Functional Level Cognition  Overall Cognitive Status: Impaired/Different from baseline Orientation Level: Oriented X4 Following Commands: Follows one step commands inconsistently, Follows one step commands with increased time General Comments: Daughter (who is Therapist, sports at [employer] hospital) was present throughout session     Extremity Assessment (includes Sensation/Coordination)  Upper Extremity Assessment: Generalized weakness  Lower Extremity Assessment: Defer to PT evaluation RLE Deficits / Details: AAROM grossly WFL, strength hip flexion 2/5, knee extension 3-/5, ankle DF 3/5; sensation impaired to approx T9-10 RLE Sensation: decreased proprioception, decreased light touch LLE Deficits / Details: AAROM grossly WFL, strength hip flexion trace, knee extension 2/5, ankle DF 0/5; sensation impaired to approx T9-10 LLE Sensation: decreased proprioception, decreased light touch    ADLs  Overall ADL's : Needs assistance/impaired Eating/Feeding: Set up, Bed level Grooming: Set up, Bed level Upper Body Bathing: Moderate assistance, Bed level Lower Body Bathing: Maximal assistance, +2 for physical assistance, Bed level Upper Body Dressing : Moderate assistance, Bed level Lower Body Dressing: Maximal assistance, +2 for physical assistance, Bed level Toileting- Clothing Manipulation and Hygiene: Maximal assistance, +2 for physical assistance, Bed level Toileting - Clothing Manipulation Details (indicate cue type and reason): Pt rolling side to side with Max A and second person performing toilet hygiene with Max A Functional mobility during ADLs: Total assistance, +2 for physical assistance(with bari stedy) General ADL Comments: Pt requiring Max A for bathing, dressing, and toileting. Pt unaware that he had BM during bed mobility and sit<>stand.     Mobility  Overal bed mobility: Needs Assistance Bed Mobility: Rolling, Sidelying to Sit Rolling: Max assist, +2 for physical assistance Sidelying to sit: Max assist, +2 for physical assistance Supine to sit: Mod assist Sit to supine: Mod assist Sit to sidelying: Max assist, +2 for physical assistance General bed mobility comments: assist to bend left knee, reach for rail and  roll. ASsist to bring bil LE off of bed and elevate trunk, increased time    Transfers   Overall transfer level: Needs assistance Equipment used: Rolling walker (2 wheeled), 2 person hand held assist Transfer via Lift Equipment: [place], [employer] Transfers: Sit to/from Stand Sit to Stand: Max assist, +2 physical assistance, From elevated surface Stand pivot transfers: Min assist  Lateral/Scoot Transfers: Mod assist General transfer comment: attempted to stand x 3 trials from EOB with bari stedy and progressively increased bed height. Cues for sequence with assist for anterior translation and rise. Pt barely able to clear sacrum on last 2 trials but unable to extend trunk or hips to safely clear stedy flaps with return to EOb. EOB physical assist for balance as pt with anterior lean. [place] from EOB to chair with 3 person assist     Ambulation / Gait / Stairs / Wheelchair Mobility  Ambulation/Gait Ambulation/Gait assistance: Min assist, Mod assist Ambulation Distance (Feet): 5 Feet Assistive device: Rolling walker (2 wheeled) Gait Pattern/deviations: Decreased step length - right, Decreased step length - left, Decreased stride length General Gait Details: unable Gait velocity interpretation: Below normal speed for age/gender    Posture / Balance Dynamic Sitting Balance Sitting balance - Comments: requires min-mod assist and bil UE supports in sitting, anterior lean Balance Overall balance assessment: Needs assistance Sitting-balance support: Feet supported, Bilateral upper extremity supported Sitting balance-Leahy Scale: Poor Sitting balance - Comments: requires min-mod assist and bil UE supports in sitting, anterior lean Standing balance support: Bilateral upper extremity supported, During functional activity Standing balance-Leahy Scale: Zero Standing balance comment: could not stand fully    Special needs/care consideration BiPAP/CPAP Yes, CPAP CPM No Continuous Drip IV LR 125 mL/hr Dialysis No      Life Vest No Oxygen No Special Bed No Trach Size No Wound Vac (area)  N   Skin: Red area break down on buttock; has a TLSO brace                         Bowel mgmt: Last BM 03/26/18 with incontinence Bladder mgmt: Condom catheter Diabetic mgmt No    Previous Home Environment Living Arrangements: Non-relatives/Friends(girlfriend) Available Help at Discharge: Friend(s), Available 24 hours/day(girlfriend returns Apr20) Type of Home: House Home Layout: Two level, Able to live on main level with bedroom/bathroom Alternate Level Stairs-Rails: Right Alternate Level Stairs-Number of Steps: 13 Home Access: Stairs to enter Entrance Stairs-Rails: Right, Left, Can reach both Entrance Stairs-Number of Steps: 7(family states working on ramp) Biochemist, clinical: Programmer, systems: Yes Home Care Services: No  Discharge Living Setting Plans for Discharge Living Setting: Patient's home, House, Lives with (comment)(Girlfriend lives with patient.) Type of Home at Discharge: House Discharge Home Layout: Two level, Laundry or work area in basement, Able to live on main level with bedroom/bathroom Alternate Level Stairs-Number of Steps: 7 steps, then landing, then 7 steps Discharge Home Access: Stairs to enter [employer] of Steps: About 6 steps per daughter [name]. Does the patient have any problems obtaining your medications?: No  Social/Family/Support Systems Patient Roles: Parent, Other (Comment)(Has a girlfriend and 3 daughters.) Contact Information: Knute Neu - daughter - [phone] Anticipated Caregiver: Girlfriend and daughters. Ability/Limitations of Caregiver: Girlfriend does not work and is retired.  Daughters work. Caregiver Availability: 24/7 Discharge Plan Discussed with Primary Caregiver: Yes Is Caregiver In Agreement with Plan?: Yes Does Caregiver/Family have Issues with Lodging/Transportation while Pt is in Rehab?: No  Goals/Additional Needs Patient/Family Goal for Rehab:  PT/OT mod I to supervision to min assist goals Expected  length of stay: 18-24 days Cultural Considerations: None Dietary Needs: Regular diet, thin liquids Equipment Needs: TBD Pt/Family Agrees to Admission and willing to participate: Yes(I spoke with his daughter, [name].) Program Orientation Provided & Reviewed with Pt/Caregiver Including Roles  & Responsibilities: Yes  Decrease burden of Care through IP rehab admission: N/A  Possible need for SNF placement upon discharge: Not anticipated  Patient Condition: This patient's condition remains as documented in the consult dated 03/30/18, in which the Rehabilitation Physician determined and documented that the patient's condition is appropriate for intensive rehabilitative care in an inpatient rehabilitation facility. Will admit to inpatient rehab today.  Preadmission Screen Completed By:  Retta Diones, 03/30/2018 12:27 PM ______________________________________________________________________   Discussed status with Dr. Letta Pate on 03/30/18 at 1227 and received telephone approval for admission today.  Admission Coordinator:  Retta Diones, time 1227/Date 03/30/18

## 2018-03-30 NOTE — Discharge Summary (Signed)
Physician Discharge Summary   Patient ID: Cody Velez MRN: [id number] DOB/AGE: Oct 28, 1946 72 y.o.  Admit date: 03/15/2018 Discharge date: 03/30/2018  Primary Care Physician:  Asencion Noble, MD   Recommendations for Outpatient Follow-up:  1. Follow up with PCP in 1-2 weeks 2. Please obtain BMP/CBC in one week 3. Please follow up on the following pending results: Intraoperative cultures  Home Health: Patient is being discharged to inpatient rehab   Discharge Condition: stable  CODE STATUS: FULL  Diet recommendation: Heart healthy diet   Discharge Diagnoses:    Osteomyelitis T10-T11 with epidural abscess  Status post laminectomy T10-T11, debridement of discitis and segmental fixation from T8  through L3  MSSA bacteremia . Chronic back pain . Essential hypertension, benign . HYPERCHOLESTEROLEMIA . GERD . Sepsis ([name]) . AKI (acute kidney injury) ([name]) . Acute metabolic encephalopathy . Chronic pain syndrome . CAD, NATIVE VESSEL . Obesity   Consults:   neuroSurgery Infectious disease   Allergies:  No Known Allergies   DISCHARGE MEDICATIONS: Allergies as of 03/30/2018   No Known Allergies     Medication List    STOP taking these medications   doxycycline 100 MG tablet Commonly known as:  VIBRA-TABS   ibuprofen 600 MG tablet Commonly known as:  ADVIL,MOTRIN     TAKE these medications   amLODipine 10 MG tablet Commonly known as:  NORVASC Take 10 mg by mouth daily.   aspirin 81 MG tablet Take 81 mg by mouth daily.   baclofen 10 MG tablet Commonly known as:  LIORESAL Take 10 mg by mouth 3 (three) times daily.   ceFAZolin IVPB Commonly known as:  ANCEF Inject 2 g into the vein every 8 (eight) hours. Indication:  MSSA bacteremia and extensive epidural abscess, discitis of thoracic spine Last Day of Therapy:  05/22/18 Labs - Once weekly:  CBC/D and BMP, Labs - Every other week:  ESR and CRP   cycloSPORINE 0.05 % ophthalmic emulsion Commonly known as:   RESTASIS 1 DROP INTO BOTH EYES TWICE A DAY FOR DRY EYES   docusate sodium 100 MG capsule Commonly known as:  COLACE Take 200 mg by mouth 2 (two) times daily.   donepezil 10 MG tablet Commonly known as:  ARICEPT Take 1 tablet (10 mg total) by mouth at bedtime.   finasteride 5 MG tablet Commonly known as:  PROSCAR Take 5 mg by mouth daily. Reported on 05/04/2016   fluticasone 50 MCG/ACT nasal spray Commonly known as:  FLONASE Place 1 spray into both nostrils daily as needed for allergies.   gabapentin 300 MG capsule Commonly known as:  NEURONTIN Take 3 capsules (900 mg total) by mouth 3 (three) times daily.   latanoprost 0.005 % ophthalmic solution Commonly known as:  XALATAN Place 1 drop into both eyes at bedtime.   losartan 100 MG tablet Commonly known as:  COZAAR Take 100 mg by mouth every morning.   memantine 10 MG tablet Commonly known as:  NAMENDA Take 1 tablet (10 mg total) by mouth 2 (two) times daily.   omeprazole 20 MG capsule Commonly known as:  PRILOSEC Take 40 mg by mouth 2 (two) times daily before a meal.   oxyCODONE-acetaminophen 5-325 MG tablet Commonly known as:  PERCOCET/ROXICET Take 2 tablets by mouth every 6 (six) hours as needed for severe pain.   pravastatin 10 MG tablet Commonly known as:  PRAVACHOL Take 10 mg by mouth every evening.   rifampin 300 MG capsule Commonly known as:  RIFADIN Take 1  capsule (300 mg total) by mouth every 12 (twelve) hours.   sildenafil 100 MG tablet Commonly known as:  VIAGRA Take 100 mg by mouth daily as needed for erectile dysfunction.   traZODone 100 MG tablet Commonly known as:  DESYREL Take 100 mg by mouth at bedtime.   venlafaxine XR 150 MG 24 hr capsule Commonly known as:  EFFEXOR-XR Take 150 mg by mouth 2 (two) times daily.   vitamin B-12 1000 MCG tablet Commonly known as:  CYANOCOBALAMIN Take 1,000 mcg by mouth daily.            Home Infusion Instuctions  (From admission, onward)         Start     Ordered   03/30/18 0000  Home infusion instructions [facility] May follow [name] Dosing Protocol; May administer Cathflo as needed to maintain patency of vascular access device.; Flushing of vascular access device: per [name]: 0.9% NaCl pre/post medica...    Question Answer Comment  Instructions May follow [facility] Dosing Protocol   Instructions May administer Cathflo as needed to maintain patency of vascular access device.   Instructions Flushing of vascular access device: per [name] Protocol: 0.9% NaCl pre/post medication administration and prn patency; Heparin 100 u/ml, 15m for implanted ports and Heparin 10u/ml, 569mfor all other central venous catheters.   Instructions May follow [facility] Anaphylaxis Protocol for First Dose Administration in the home: 0.9% NaCl at 25-50 ml/hr to maintain IV access for protocol meds. Epinephrine 0.3 ml IV/IM PRN and Benadryl 25-50 IV/IM PRN s/s of anaphylaxis.   Instructions [facility] Infusion Coordinator (RN) to assist per patient IV care needs in the home PRN.      03/30/18 1157       Brief H and P: For complete details please refer to admission H and P, but in brief Patient is a [age]ear old male with history of chronic back pain, recent spinal cord stimulator placement several weeks prior to admission, nephrolithiasis, recent admission for UTI, hypertension, dyslipidemia, obesity, CAD presented to ED with generalized weakness that led to a fall at home.He actually saw urology on the day of admission as a follow-up appointment [facility] [place] [place] noted to have some bilateral nephrolithiasis. He was apparently prescribed some doxycycline during his urology visit, likely due to suspicion of urinary tract infection. He had progressive confusion throughout the day and was noted to be hypoxemic upon arrival as well as hypoxemic during his urology visit earlier. Patient continued to complain of  lower back pain, lower extremity weakness, neurosurgery was consulted.  Patient underwent laminectomy T10-T11 decompression of epidural abscess, debridement of discitis T10-11 and segmental fixation from T8 through L3   Hospital Course:   Osteomyelitis T10-T11 with epidural abscess -Status post laminectomy T10- T11 decompression of epidural abscess, debridement of discitis T10-11 and segmental fixation from T8 through L3 on 03/28/18 -ESR 94, CRP 17 on 4/2.  ID following, recommended IV cefazolin for 6-8 weeks -Follow intraoperative cultures, management per neurosurgery, pain control -PT evaluation recommended CIR, accepted by inpatient rehab -Outpatient follow-up with Dr. ElEllene Route  Bacteremia/sepsis due to methicillin susceptible Staphylococcus aureus (MSSA) - patient was followed closely by infectious disease, Dr. VaDrucilla Schmidtrecommended IV cefazolin 2 g every 8 hours for 8 weeks, oral rifampin 300 mg twice a day for 8 weeks, end date May 22, 2018. -Plan has been placed, labs weekly on IV antibiotic CBC with differential, CMP, CRP, ESR  Hypokalemia Resolved  Chronic back pain status post stimulator -Neurosurgery following, no acute issues  Obesity -BMI 35, recommended diet and weight control  GERD -Continue PPI  Constipation - Continue stool softeners, added mag citrate    Day of Discharge S: No complaints, feeling better  BP (!) 102/50 (BP Location: Left Arm)   Pulse 86   Temp 98.5 F (36.9 C) (Oral)   Resp 13   Ht '6\' 3"'  (1.905 m)   Wt 130.4 kg (287 lb 7.7 oz)   SpO2 95%   BMI 35.93 kg/m   Physical Exam: General: Alert and awake oriented x3 not in any acute distress. HEENT: anicteric sclera, pupils reactive to light and accommodation CVS: S1-S2 clear no murmur rubs or gallops Chest: clear to auscultation bilaterally, no wheezing rales or rhonchi Abdomen: soft nontender, nondistended, normal bowel sounds Extremities: no cyanosis, clubbing or edema noted  bilaterally Neuro: lower extremity weakness, per patient improving.  Dressing on the back.   The results of significant diagnostics from this hospitalization (including imaging, microbiology, ancillary and laboratory) are listed below for reference.      Procedures/Studies:  Dg Thoracolumabar Spine  Result Date: 03/28/2018 CLINICAL DATA:  T8 through L2 fixation. FLUOROSCOPY TIME:  1 minutes 8 seconds. EXAM: THORACOLUMBAR SPINE 1V COMPARISON:  CT scan of March 20, 2018. FINDINGS: Status post surgical posterior fusion extending from T8 to L2 with bilateral intrapedicular screw placement. Good alignment of vertebral bodies is noted. IMPRESSION: Status post surgical posterior fusion of thoracolumbar junction as described above. Electronically Signed   By: Marijo Conception, M.D.   On: 03/28/2018 13:32   Dg Abd 1 View  Result Date: 03/19/2018 CLINICAL DATA:  Abdominal distension. EXAM: ABDOMEN - 1 VIEW COMPARISON:  None. FINDINGS: Nonspecific gaseous distension of the colon. There is no bowel dilatation to suggest obstruction. There is no evidence of pneumoperitoneum, portal venous gas or pneumatosis. There are no pathologic calcifications along the expected course of the ureters. There is posterior lumbar interbody fusion from L1 through L3. There is interbody fusion at L3-4 and L4-5. IMPRESSION: Negative. Electronically Signed   By: Kathreen Devoid   On: 03/19/2018 16:39   Ct Head Wo Contrast  Result Date: 03/19/2018 CLINICAL DATA:  Altered level of consciousness. Fall on 03/11/2018 with increasing confusion. EXAM: CT HEAD WITHOUT CONTRAST TECHNIQUE: Contiguous axial images were obtained from the base of the skull through the vertex without intravenous contrast. COMPARISON:  Outside brain MRI 05/02/2015 FINDINGS: Brain: There is no evidence of acute large territory infarct, intracranial hemorrhage, mass, midline shift, or extra-axial fluid collection. Mild lateral, third, and fourth ventriculomegaly is  unchanged and may reflect cerebral atrophy or normal pressure hydrocephalus in the appropriate clinical setting. The right lateral ventricle remains slightly asymmetrically dilated compared to the left, most notably in the temporal horn. Patchy to confluent hypodensities throughout the cerebral white matter correspond to extensive T2 hyperintensities on MRI and are nonspecific but compatible with chronic small vessel ischemic disease. There is mild patchy hypoattenuation in the pons. Vascular: Mild calcified atherosclerosis at the skull base. No hyperdense vessel. Skull: No fracture or focal osseous lesion. Sinuses/Orbits: Prior functional sinus surgery. No acute sinus findings. Clear mastoid air cells. Unremarkable orbits. Other: None. IMPRESSION: 1. No evidence of acute intracranial abnormality. 2. Extensive chronic small vessel ischemic disease. 3. Unchanged mild chronic ventriculomegaly as above. Electronically Signed   By: Logan Bores M.D.   On: 03/19/2018 15:38   Ct Angio Chest Pe W And/or Wo Contrast  Result Date: 03/15/2018 CLINICAL DATA:  72 year old male with acute generalized abdominal pain and fever. EXAM: CT ANGIOGRAPHY CHEST CT ABDOMEN AND PELVIS WITH CONTRAST TECHNIQUE: Multidetector CT imaging of the chest was performed using the standard protocol during bolus administration of intravenous contrast. Multiplanar CT image reconstructions and MIPs were obtained to evaluate the vascular anatomy. Multidetector CT imaging of the abdomen and pelvis was performed using the standard protocol during bolus administration of intravenous contrast. CONTRAST:  31m ISOVUE-370 IOPAMIDOL (ISOVUE-370) INJECTION 76% COMPARISON:  Abdominal CT dated 03/15/2018 FINDINGS: CTA CHEST FINDINGS Cardiovascular: Top-normal cardiac size. No pericardial effusion. Mild atherosclerotic calcification of the thoracic aorta. No aneurysmal dilatation or evidence of dissection. The origins of the great vessels of the aortic arch are  patent. Evaluation of the pulmonary arteries is limited due to suboptimal opacification and respiratory motion artifact. No large or central pulmonary artery embolus identified. Mediastinum/Nodes: There is no hilar or mediastinal adenopathy. Esophagus and the thyroid gland are grossly unremarkable. No mediastinal fluid collection. Lungs/Pleura: Bibasilar linear and streaky densities, likely atelectasis/scarring. There is a 4 mm left lower lobe nodule (series 6, image 87). There is no focal consolidation, pleural effusion, or pneumothorax. The central airways are patent. Musculoskeletal: There is degenerative changes of the spine. No acute fracture. Stimulator device extending into the upper thoracic spine. Review of the MIP images confirms the above findings. CT ABDOMEN and PELVIS FINDINGS No intra-abdominal free air or free fluid. Hepatobiliary: Apparent mild fatty infiltration of the liver. No intrahepatic biliary ductal dilatation. The gallbladder is unremarkable. Pancreas: Unremarkable. No pancreatic ductal dilatation or surrounding inflammatory changes. Spleen: Normal in size without focal abnormality. Adrenals/Urinary Tract: The adrenal glands are unremarkable. Small nonobstructing bilateral renal calculi measure up to 5 mm in the upper pole of the left kidney. There is no hydronephrosis on either side. A 1 cm hypodense lesion from the superior pole of the right kidney demonstrates fluid attenuation most consistent with a cyst. The visualized ureters and urinary bladder appear unremarkable. Stomach/Bowel: Small hiatal hernia. There is no bowel obstruction or active inflammation. Air is noted throughout the colon. The appendix is normal. Vascular/Lymphatic: Mild aortoiliac atherosclerotic disease. No portal venous gas. There is no adenopathy. Reproductive: The prostate and seminal vesicles are grossly unremarkable. Other: Small fat containing bilateral inguinal and umbilical hernias. No fluid collection or  inflammatory changes. Musculoskeletal: There is osteopenia with multilevel degenerative changes. L1-L3 posterior fusion hardware and L4-L5 and L5-S1 interbody disc spacer. No acute osseous pathology. Review of the MIP images confirms the above findings. IMPRESSION: 1. No acute intrathoracic, abdominal, or pelvic pathology. 2. Mild fatty liver. 3. Small nonobstructing bilateral renal calculi.  No hydronephrosis. 4. Mild air distention of the colon. No bowel obstruction or active inflammation. Normal appendix. 5.  Aortic Atherosclerosis (ICD10-I70.0). 6. A 4 mm left lower lobe pulmonary nodule. No follow-up needed if patient is low-risk. Non-contrast chest CT can be considered in 12 months if patient is high-risk. This recommendation follows the consensus statement: Guidelines for Management of Incidental Pulmonary Nodules Detected on CT Images: From the Fleischner Society 2017; Radiology 2017; [id number] 7. Electronically Signed   By: AAnner CreteM.D.   On: 03/15/2018 22:25   Ct Cervical Spine W Contrast  Result Date: 03/23/2018 CLINICAL DATA:  Sepsis, pneumonia. Evaluate for possible spinal source of infection or epidural abscess. EXAM: CT CERVICAL SPINE WITH CONTRAST TECHNIQUE: Multidetector CT imaging of the cervical spine was performed during intravenous contrast administration. Multiplanar CT image reconstructions were also generated. CONTRAST:  68m ISOVUE-300 IOPAMIDOL (ISOVUE-300) INJECTION 61% COMPARISON:  CT of the thoracic and lumbar spine 03/20/2018. FINDINGS: Alignment: Straightening of the normal cervical lordosis status post C3-C6 ACDF. 2 mm anterolisthesis C2-3 is physiologic/facet mediated. Skull base and vertebrae: No acute fracture. No primary bone lesion or focal pathologic process. Soft tissues and spinal canal: Dorsal column stimulator has been placed opposite the C4-T1 vertebral segments. This device, in combination with artifact from plate and screw fixation precludes definitive  evaluation of the spinal canal. Presence or absence of an epidural/subdural fluid collection or abscess cannot be confirmed or excluded. No paravertebral inflammatory process is identified. There are no findings strongly suggestive of osteomyelitis. Disc levels: C2-3: Uncinate spurring and LEFT greater than RIGHT facet arthropathy results in LEFT C3 foraminal narrowing. C3-4:  Post fusion interspace.  Foraminal narrowing on the RIGHT. C4-5:  Post fusion interspace.  Foraminal narrowing on the RIGHT. C5-6:  Post fusion interspace.  Unremarkable. C6-7:  Disc space narrowing.  No definite impingement. C7-T1:  Facet arthropathy.  No definite impingement. Upper chest: Not assessed. Other: None. IMPRESSION: Dorsal column stimulator placement opposite the C4-T1 vertebral segments, in conjunction with artifact from plate and screw fixation due to C3-C6 ACDF precludes definitive evaluation of the spinal canal. Presence or absence of an epidural/subdural fluid collection or abscess cannot be confirmed or excluded. Consider removal of the dorsal column stimulator, followed by MRI of the cervical spine without and with contrast, if strong suspicion of intraspinal fluid collection exists, as the parameters of the current spinal cord stimulator do not allow cervical MR imaging. Advanced spondylosis. Bony overgrowth contributes to multilevel foraminal narrowing as described. No paravertebral inflammatory process or changes of osteomyelitis are observed. Electronically Signed   By: JStaci RighterM.D.   On: 03/23/2018 07:52   Ct Thoracic Spine W Contrast  Addendum Date: 03/22/2018   ADDENDUM REPORT: 03/22/2018 13:30 ADDENDUM: Correction to report as follows: EXAM: CT THORACIC AND LUMBAR SPINE WITH CONTRAST TECHNIQUE: Multidetector CT imaging of the thoracic and lumbar spine was performed with contrast. Multiplanar CT image reconstructions were also generated. CONTRAST:  100 cc Isovue 300 Electronically Signed   By: LKristine GarbeM.D.   On: 03/22/2018 13:30   Result Date: 03/22/2018 CLINICAL DATA:  72y/o M; 72y/o M; mid and lower back pain with new leg weakness. Fall at home 03/15/2018. MSSA bacteremia. EXAM: CT THORACIC AND LUMBAR SPINE WITHOUT CONTRAST TECHNIQUE: Multidetector CT imaging of the thoracic and lumbar spine was performed without contrast. Multiplanar CT image reconstructions were also generated. COMPARISON:  03/15/2018 CT of chest, abdomen, and pelvis. FINDINGS: CT THORACIC SPINE FINDINGS Alignment: Normal. Vertebrae: No acute fracture or focal pathologic process. Multiple endplate Schmorl's nodes are stable from prior CT. No new loss of vertebral body height. Partially visualized anterior cervical discectomy and fusion. Paraspinal and other soft tissues: Spinal stimulator extending to the cervical spinal canal and above the field of view. Small bilateral pleural effusions. Paravertebral edema is present surrounding T10 and T11 vertebral bodies which may represent underlying occult fracture, bone contusion, possibly infection Disc levels: L2-3 loss of disc space height and endplate degenerative changes. T10-L1 hypertrophic facet arthropathy. CT LUMBAR SPINE FINDINGS Segmentation: 5 lumbar type vertebrae. Alignment: Normal. Vertebrae: L1-L3 posterior instrumented fusion and interbody fusion with laminectomy. L4-S1 interbody fusion. No new loss of vertebral body height or malalignment. Hardware is intact without apparent hardware related complication or periprosthetic lucency. Paraspinal and other soft tissues: Aortic atherosclerosis. Disc levels: Endplate marginal osteophytes and facet  hypertrophy results in mild bony foraminal stenosis at the left L2-3 and L3-4 levels and moderate bony foraminal stenosis at L5-S1. On the right there is mild bony foraminal stenosis at L1-2, L2-3, and L5-S1. No high-grade bony canal stenosis. IMPRESSION: No acute fracture, malalignment, or bony erosive changes identified.  However, paravertebral edema is present surrounding T10 and T11 vertebral bodies which may represent underlying occult fracture, bone contusion, or possibly infection. Consider thoracic spine MRI with and without contrast for further evaluation. These results will be called to the ordering clinician or representative by the Radiologist Assistant, and communication documented in the PACS or zVision Dashboard. Electronically Signed: By: Kristine Garbe M.D. On: 03/20/2018 20:22   Ct Lumbar Spine W Contrast  Addendum Date: 03/22/2018   ADDENDUM REPORT: 03/22/2018 13:30 ADDENDUM: Correction to report as follows: EXAM: CT THORACIC AND LUMBAR SPINE WITH CONTRAST TECHNIQUE: Multidetector CT imaging of the thoracic and lumbar spine was performed with contrast. Multiplanar CT image reconstructions were also generated. CONTRAST:  100 cc Isovue 300 Electronically Signed   By: Kristine Garbe M.D.   On: 03/22/2018 13:30   Result Date: 03/22/2018 CLINICAL DATA:  72 y/o M; 72 y/o M; mid and lower back pain with new leg weakness. Fall at home 03/15/2018. MSSA bacteremia. EXAM: CT THORACIC AND LUMBAR SPINE WITHOUT CONTRAST TECHNIQUE: Multidetector CT imaging of the thoracic and lumbar spine was performed without contrast. Multiplanar CT image reconstructions were also generated. COMPARISON:  03/15/2018 CT of chest, abdomen, and pelvis. FINDINGS: CT THORACIC SPINE FINDINGS Alignment: Normal. Vertebrae: No acute fracture or focal pathologic process. Multiple endplate Schmorl's nodes are stable from prior CT. No new loss of vertebral body height. Partially visualized anterior cervical discectomy and fusion. Paraspinal and other soft tissues: Spinal stimulator extending to the cervical spinal canal and above the field of view. Small bilateral pleural effusions. Paravertebral edema is present surrounding T10 and T11 vertebral bodies which may represent underlying occult fracture, bone contusion, possibly infection  Disc levels: L2-3 loss of disc space height and endplate degenerative changes. T10-L1 hypertrophic facet arthropathy. CT LUMBAR SPINE FINDINGS Segmentation: 5 lumbar type vertebrae. Alignment: Normal. Vertebrae: L1-L3 posterior instrumented fusion and interbody fusion with laminectomy. L4-S1 interbody fusion. No new loss of vertebral body height or malalignment. Hardware is intact without apparent hardware related complication or periprosthetic lucency. Paraspinal and other soft tissues: Aortic atherosclerosis. Disc levels: Endplate marginal osteophytes and facet hypertrophy results in mild bony foraminal stenosis at the left L2-3 and L3-4 levels and moderate bony foraminal stenosis at L5-S1. On the right there is mild bony foraminal stenosis at L1-2, L2-3, and L5-S1. No high-grade bony canal stenosis. IMPRESSION: No acute fracture, malalignment, or bony erosive changes identified. However, paravertebral edema is present surrounding T10 and T11 vertebral bodies which may represent underlying occult fracture, bone contusion, or possibly infection. Consider thoracic spine MRI with and without contrast for further evaluation. These results will be called to the ordering clinician or representative by the Radiologist Assistant, and communication documented in the PACS or zVision Dashboard. Electronically Signed: By: Kristine Garbe M.D. On: 03/20/2018 20:22   Ct Abdomen Pelvis W Contrast  Result Date: 03/15/2018 CLINICAL DATA:  72 year old male with acute generalized abdominal pain and fever. EXAM: CT ANGIOGRAPHY CHEST CT ABDOMEN AND PELVIS WITH CONTRAST TECHNIQUE: Multidetector CT imaging of the chest was performed using the standard protocol during bolus administration of intravenous contrast. Multiplanar CT image reconstructions and MIPs were obtained to evaluate the vascular anatomy. Multidetector CT imaging of the  abdomen and pelvis was performed using the standard protocol during bolus administration  of intravenous contrast. CONTRAST:  25m ISOVUE-370 IOPAMIDOL (ISOVUE-370) INJECTION 76% COMPARISON:  Abdominal CT dated 03/15/2018 FINDINGS: CTA CHEST FINDINGS Cardiovascular: Top-normal cardiac size. No pericardial effusion. Mild atherosclerotic calcification of the thoracic aorta. No aneurysmal dilatation or evidence of dissection. The origins of the great vessels of the aortic arch are patent. Evaluation of the pulmonary arteries is limited due to suboptimal opacification and respiratory motion artifact. No large or central pulmonary artery embolus identified. Mediastinum/Nodes: There is no hilar or mediastinal adenopathy. Esophagus and the thyroid gland are grossly unremarkable. No mediastinal fluid collection. Lungs/Pleura: Bibasilar linear and streaky densities, likely atelectasis/scarring. There is a 4 mm left lower lobe nodule (series 6, image 87). There is no focal consolidation, pleural effusion, or pneumothorax. The central airways are patent. Musculoskeletal: There is degenerative changes of the spine. No acute fracture. Stimulator device extending into the upper thoracic spine. Review of the MIP images confirms the above findings. CT ABDOMEN and PELVIS FINDINGS No intra-abdominal free air or free fluid. Hepatobiliary: Apparent mild fatty infiltration of the liver. No intrahepatic biliary ductal dilatation. The gallbladder is unremarkable. Pancreas: Unremarkable. No pancreatic ductal dilatation or surrounding inflammatory changes. Spleen: Normal in size without focal abnormality. Adrenals/Urinary Tract: The adrenal glands are unremarkable. Small nonobstructing bilateral renal calculi measure up to 5 mm in the upper pole of the left kidney. There is no hydronephrosis on either side. A 1 cm hypodense lesion from the superior pole of the right kidney demonstrates fluid attenuation most consistent with a cyst. The visualized ureters and urinary bladder appear unremarkable. Stomach/Bowel: Small hiatal  hernia. There is no bowel obstruction or active inflammation. Air is noted throughout the colon. The appendix is normal. Vascular/Lymphatic: Mild aortoiliac atherosclerotic disease. No portal venous gas. There is no adenopathy. Reproductive: The prostate and seminal vesicles are grossly unremarkable. Other: Small fat containing bilateral inguinal and umbilical hernias. No fluid collection or inflammatory changes. Musculoskeletal: There is osteopenia with multilevel degenerative changes. L1-L3 posterior fusion hardware and L4-L5 and L5-S1 interbody disc spacer. No acute osseous pathology. Review of the MIP images confirms the above findings. IMPRESSION: 1. No acute intrathoracic, abdominal, or pelvic pathology. 2. Mild fatty liver. 3. Small nonobstructing bilateral renal calculi.  No hydronephrosis. 4. Mild air distention of the colon. No bowel obstruction or active inflammation. Normal appendix. 5.  Aortic Atherosclerosis (ICD10-I70.0). 6. A 4 mm left lower lobe pulmonary nodule. No follow-up needed if patient is low-risk. Non-contrast chest CT can be considered in 12 months if patient is high-risk. This recommendation follows the consensus statement: Guidelines for Management of Incidental Pulmonary Nodules Detected on CT Images: From the Fleischner Society 2017; Radiology 2017; 2(631) [id number] 7. Electronically Signed   By: AAnner CreteM.D.   On: 03/15/2018 22:25   Dg Lumbar Spine 1 View  Result Date: 03/28/2018 CLINICAL DATA:  T8 through L2 fusion EXAM: LUMBAR SPINE - 1 VIEW COMPARISON:  None. FINDINGS: Single cross-table lateral x-ray of the lumbar spine is provided. Posterior tissue retractors are noted. Posterior lumbar interbody fusion from L1 through L3. Interbody fusion at L3-4. Interbody cages at L4-5 and L5-S1. IMPRESSION: Intraoperative localization as described above. Electronically Signed   By: HKathreen Devoid  On: 03/28/2018 09:37   Dg Chest Port 1 View  Result Date: 03/17/2018 CLINICAL DATA:   Respiratory distress.  History of prostate carcinoma EXAM: PORTABLE CHEST 1 VIEW COMPARISON:  Chest radiograph and chest CT March 15, 2018  FINDINGS: There is new airspace consolidation in the medial right base, suspicious for pneumonia. The lungs elsewhere are clear. There is cardiomegaly with pulmonary venous hypertension. There is a small right pleural effusion. No adenopathy. There is a stimulator with lead tips at the cervical-thoracic junction. There is postoperative change in the lower cervical spine. IMPRESSION: Medial right base consolidation felt to represent pneumonia. Underlying pulmonary vascular congestion. Small right pleural effusion. Electronically Signed   By: Lowella Grip III M.D.   On: 03/17/2018 09:48   Dg Chest Portable 1 View  Result Date: 03/15/2018 CLINICAL DATA:  72 year old male status post fall getting into car today. Confusion beginning about 1630 hours. Shortness of breath. EXAM: PORTABLE CHEST 1 VIEW COMPARISON:  Portable chest 05/04/2016 and earlier. FINDINGS: Portable AP upright view at 1854 hours. Chronic cardiomegaly. Stable mediastinal contours. Visualized tracheal air column is within normal limits. Mild respiratory motion artifact. Suggestion of increased pulmonary vascularity compared to 2017. No pneumothorax, pleural effusion or consolidation is evident. Prior cervical spine surgery with spinal stimulator device coursing to the cervical region. No acute osseous abnormality identified. Negative visible bowel gas pattern. IMPRESSION: Cardiomegaly with appearance of increased pulmonary vascularity from prior studies. Consider acute pulmonary edema. Electronically Signed   By: Genevie Ann M.D.   On: 03/15/2018 19:09   Dg C-arm 1-60 Min  Result Date: 03/28/2018 CLINICAL DATA:  T8 through L2 fixation. FLUOROSCOPY TIME:  1 minutes 8 seconds. EXAM: THORACOLUMBAR SPINE 1V COMPARISON:  CT scan of March 20, 2018. FINDINGS: Status post surgical posterior fusion extending from T8 to  L2 with bilateral intrapedicular screw placement. Good alignment of vertebral bodies is noted. IMPRESSION: Status post surgical posterior fusion of thoracolumbar junction as described above. Electronically Signed   By: Marijo Conception, M.D.   On: 03/28/2018 13:32   Dg C-arm 1-60 Min  Result Date: 03/28/2018 CLINICAL DATA:  T8 through L2 fixation. FLUOROSCOPY TIME:  1 minutes 8 seconds. EXAM: THORACOLUMBAR SPINE 1V COMPARISON:  CT scan of March 20, 2018. FINDINGS: Status post surgical posterior fusion extending from T8 to L2 with bilateral intrapedicular screw placement. Good alignment of vertebral bodies is noted. IMPRESSION: Status post surgical posterior fusion of thoracolumbar junction as described above. Electronically Signed   By: Marijo Conception, M.D.   On: 03/28/2018 13:32   [place] Ekg Site Rite  Result Date: 03/23/2018 If Site Rite image not attached, placement could not be confirmed due to current cardiac rhythm.  [place] Ekg Site Rite  Result Date: 03/22/2018 If Site Rite image not attached, placement could not be confirmed due to current cardiac rhythm.     LAB RESULTS: Basic Metabolic Panel: Recent Labs  Lab 03/28/18 1723 03/29/18 0439  NA 133* 131*  K 5.7* 4.7  CL 100* 97*  CO2 24 25  GLUCOSE 138* 117*  BUN 19 19  CREATININE 1.09 1.03  CALCIUM 8.9 8.7*   Liver Function Tests: Recent Labs  Lab 03/28/18 1723 03/29/18 0439  AST 44* 43*  ALT 20 17  ALKPHOS 87 83  BILITOT 0.5 0.4  PROT 7.4 6.7  ALBUMIN 2.6* 2.3*   No results for input(s): LIPASE, AMYLASE in the last 168 hours. No results for input(s): AMMONIA in the last 168 hours. CBC: Recent Labs  Lab 03/28/18 1723 03/29/18 0439  WBC 15.9* 12.8*  HGB 9.7* 8.9*  HCT 31.2* 29.6*  MCV 85.7 85.8  PLT 722* 619*   Cardiac Enzymes: No results for input(s): CKTOTAL, CKMB, CKMBINDEX, TROPONINI in the last  168 hours. BNP: Invalid input(s): POCBNP CBG: No results for input(s): GLUCAP in the last 168  hours.    Disposition and Follow-up: Discharge Instructions    Home infusion instructions [facility] May follow [place] Dosing Protocol; May administer Cathflo as needed to maintain patency of vascular access device.; Flushing of vascular access device: per [facility] Protocol: 0.9% NaCl pre/post medica...   Complete by:  As directed    Instructions:  May follow [place] Dosing Protocol   Instructions:  May administer Cathflo as needed to maintain patency of vascular access device.   Instructions:  Flushing of vascular access device: per [facility] Protocol: 0.9% NaCl pre/post medication administration and prn patency; Heparin 100 u/ml, 70m for implanted ports and Heparin 10u/ml, 561mfor all other central venous catheters.   Instructions:  May follow [facility] Anaphylaxis Protocol for First Dose Administration in the home: 0.9% NaCl at 25-50 ml/hr to maintain IV access for protocol meds. Epinephrine 0.3 ml IV/IM PRN and Benadryl 25-50 IV/IM PRN s/s of anaphylaxis.   Instructions:  AdCatherinenfusion Coordinator (RN) to assist per patient IV care needs in the home PRN.   Incentive spirometry RT   Complete by:  As directed        DISPOSITION: inpatient rehab   [place]  FaAsencion NobleMD. Schedule an appointment as soon as possible for a visit in 2 week(s).   Specialty:  Internal Medicine Contact information: [address] [phone]          Time coordinating discharge:  35 minutes  Signed:   RiEstill Cotta.D. [facility] 03/30/2018, 11:57 AM Pager: [phone]

## 2018-03-30 NOTE — Progress Notes (Signed)
[name], MD  Physician  Physical Medicine and Rehabilitation  Consult Note  Signed  Date of Service:  03/30/2018 6:21 AM       Related encounter: ED to Hosp-Admission (Current) from 03/15/2018 in [place] NEURO/TRAUMA/SURGICAL ICU      Signed      Expand All Collapse All       [] Hide copied text  [] Hover for details        Physical Medicine and Rehabilitation Consult Reason for Consult: Decreased functional mobility Referring Physician: [facility]   HPI: Cody Velez is a 72 y.o. right-handed male with history of hypertension, memory deficits maintained on Aricept and Namenda, prostate cancer, PTSD, chronic back pain with multiple lumbar surgeries with recent spinal cord stimulator placement January 2019 as well as treatment for recent UTI.   Per chart review patient lives with girlfriend.  Independent with a cane prior to admission and still driving.  2 level home with bedroom on main and 7 steps to entry with plan for ramp.  Girlfriend can assist as needed however she is currently on vacation in [place] until April 20.  Presented 03/16/2018 with lethargy/fall as well as hypoxemia with paraparesis.  CT Angio of the chest negative for pulmonary emboli.  Cranial CT scan negative for acute changes.  Echocardiogram with ejection fraction of 60%.  Systolic function was normal.  EEG negative.  X-rays and imaging revealed osteomyelitis T10-T11 with epidural abscess.  Infectious disease consulted for bacteremia due to MSSA and maintained on broad-spectrum antibiotics of intravenous ceftezole and with plan for 8 weeks duration ending May 22, 2018.  Patient underwent laminectomy T10-T11 decompression of epidural abscess debridement of discitis T10-T11 with segmental fixation from T8-L3 03/28/2018 per Dr. Ellene Route.  TLSO back brace applied in sitting position.  Hospital course pain management.  Physical therapy evaluation completed 03/29/2018 with recommendations of physical  medicine rehab consult.   Review of Systems  Constitutional: Positive for malaise/fatigue.  HENT: Negative for hearing loss.   Eyes: Negative for blurred vision and double vision.  Respiratory: Negative for cough.        Shortness of breath with heavy exertion  Cardiovascular: Positive for leg swelling. Negative for chest pain and palpitations.  Gastrointestinal: Positive for constipation. Negative for nausea and vomiting.       GERD  Genitourinary: Positive for urgency. Negative for flank pain and hematuria.  Musculoskeletal: Positive for back pain and falls.  Skin: Negative for rash.  Neurological: Positive for weakness. Negative for seizures.  Psychiatric/Behavioral: Positive for memory loss.  All other systems reviewed and are negative.  Past Medical History:  Diagnosis Date  . Allergic rhinitis   . Arthritis   . Asthma    as a child  . B12 deficiency   . Cancer [facility])    prostate  . Cervicogenic headache 01/28/2016  . Chronic back pain   . Coronary atherosclerosis of native coronary artery    Mild nonobstructive CAD at catheterization January 2015  . Depression   . Essential hypertension, benign   . Falls   . GERD (gastroesophageal reflux disease)   . History of cerebrovascular disease 07/23/2015  . History of pneumonia 02/2011  . Hyperlipidemia   . Kidney stone   . Memory difficulty 07/23/2015  . OSA (obstructive sleep apnea)    CPAP - Dr. [name]  . Prostate cancer ([place])   . PTSD (post-traumatic stress disorder)    [place]  . Rectal bleeding  Past Surgical History:  Procedure Laterality Date  . BACK SURGERY  02/14/12   lumbar OR #7; "today redid L1L2; replaced screws; added bone from hip"  . BILATERAL KNEE ARTHROSCOPY    . COLONOSCOPY  10/15/2008   Dr. [name]: tubular adenoma   . COLONOSCOPY  12/17/2003   SWF:UXNATF rectal and colon  . COLONOSCOPY N/A 09/05/2014   Procedure: COLONOSCOPY;  Surgeon: Daneil Dolin, MD;   Location: [facility] ENDO [facility];  Service: Endoscopy;  Laterality: N/A;  7:30-rescheduled 9/17 to [place] notified pt  . CYSTOSCOPY WITH STENT PLACEMENT Right 01/27/2016   Procedure: CYSTOSCOPY WITH STENT PLACEMENT;  Surgeon: [name], MD;  Location: [facility] ORS;  Service: Urology;  Laterality: Right;  . CYSTOSCOPY/RETROGRADE/URETEROSCOPY/STONE EXTRACTION WITH BASKET Right 01/27/2016   Procedure: CYSTOSCOPY, RIGHT RETROGRADE, RIGHT URETEROSCOPY, STONE EXTRACTION ;  Surgeon: [name], MD;  Location: [facility] ORS;  Service: Urology;  Laterality: Right;  . ESOPHAGOGASTRODUODENOSCOPY  10/15/2008     Dr Rourk:Schatzki's ring status post dilation and disruption via 60 F Maloney dilator/ otherwise unremarkable esophagus, small hiatal hernia, multiple fundal gland polyps not manipulated, gastritis, negative H.pylori  . ESOPHAGOGASTRODUODENOSCOPY  06/21/02   TDD:UKGUR sliding hiatal hernia with mild changes of reflux esophagitis limited to gastroesophageal junction.  Noncritical ring at distal esophagus, 3 cm proximal to gastroesophageal junction/Antral gastritis  . [place]   "broke face playing softball"  . FRACTURE SURGERY     "left wrist; broke it; took spur off"  . HOLMIUM LASER APPLICATION Right 03/21/7061   Procedure: HOLMIUM LASER APPLICATION;  Surgeon: [name], MD;  Location: [facility] ORS;  Service: Urology;  Laterality: Right;  . LEFT HEART CATHETERIZATION WITH CORONARY ANGIOGRAM N/A 12/27/2013   Procedure: LEFT HEART CATHETERIZATION WITH CORONARY ANGIOGRAM;  Surgeon: [name], MD;  Location: [facility] C[facility];  Service: Cardiovascular;  Laterality: N/A;  . neck epidural    . SHOULDER SURGERY Bilateral   . TEE WITHOUT CARDIOVERSION N/A 03/21/2018   Procedure: TRANSESOPHAGEAL ECHOCARDIOGRAM (TEE) WITH PROPOFOL;  Surgeon: [name], MD;  Location: [facility];  Service: Cardiovascular;  Laterality: N/A;        Family History  Problem Relation  Age of Onset  . Emphysema Father   . Heart failure Father   . Lung cancer Father   . CAD Father   . Colon cancer Mother   . Stroke Mother   . Breast cancer Mother   . Stroke Sister   . Heart attack Brother   . Dementia Paternal Uncle   . Emphysema Maternal Grandmother   . Stroke Maternal Grandmother   . Asthma Other        grandson  . Heart disease Paternal Grandfather   . Anesthesia problems Neg Hx   . Hypotension Neg Hx   . Malignant hyperthermia Neg Hx   . Pseudochol deficiency Neg Hx    Social History:  reports that he quit smoking about 59 years ago. His smoking use included cigarettes. His smokeless tobacco use includes chew. He reports that he drinks alcohol. He reports that he does not use drugs. Allergies: No Known Allergies       Medications Prior to Admission  Medication Sig Dispense Refill  . amLODipine (NORVASC) 10 MG tablet Take 10 mg by mouth daily.    Marland Kitchen aspirin 81 MG tablet Take 81 mg by mouth daily.    . baclofen (LIORESAL) 10 MG tablet Take 10 mg by mouth 3 (three) times daily.    Marland Kitchen  cycloSPORINE (RESTASIS) 0.05 % ophthalmic emulsion 1 DROP INTO BOTH EYES TWICE A DAY FOR DRY EYES    . docusate sodium (COLACE) 100 MG capsule Take 200 mg by mouth 2 (two) times daily.    Marland Kitchen donepezil (ARICEPT) 10 MG tablet Take 1 tablet (10 mg total) by mouth at bedtime. 90 tablet 3  . doxycycline (VIBRA-TABS) 100 MG tablet Take 100 mg by mouth 2 (two) times daily. 7 day course starting on 03/15/18    . finasteride (PROSCAR) 5 MG tablet Take 5 mg by mouth daily. Reported on 05/04/2016    . fluticasone (FLONASE) 50 MCG/ACT nasal spray Place 1 spray into both nostrils daily as needed for allergies.     Marland Kitchen gabapentin (NEURONTIN) 300 MG capsule Take 3 capsules (900 mg total) by mouth 3 (three) times daily. 810 capsule 3  . ibuprofen (ADVIL,MOTRIN) 600 MG tablet Take 600 mg by mouth 3 (three) times daily.    Marland Kitchen latanoprost (XALATAN) 0.005 % ophthalmic  solution Place 1 drop into both eyes at bedtime.     Marland Kitchen losartan (COZAAR) 100 MG tablet Take 100 mg by mouth every morning.     . memantine (NAMENDA) 10 MG tablet Take 1 tablet (10 mg total) by mouth 2 (two) times daily. 180 tablet 3  . omeprazole (PRILOSEC) 20 MG capsule Take 40 mg by mouth 2 (two) times daily before a meal.     . oxyCODONE-acetaminophen (PERCOCET/ROXICET) 5-325 MG tablet Take 2 tablets by mouth every 6 (six) hours as needed for severe pain.    . pravastatin (PRAVACHOL) 10 MG tablet Take 10 mg by mouth every evening.     . traZODone (DESYREL) 100 MG tablet Take 100 mg by mouth at bedtime.    Marland Kitchen venlafaxine XR (EFFEXOR-XR) 150 MG 24 hr capsule Take 150 mg by mouth 2 (two) times daily.     . vitamin B-12 (CYANOCOBALAMIN) 1000 MCG tablet Take 1,000 mcg by mouth daily.    . sildenafil (VIAGRA) 100 MG tablet Take 100 mg by mouth daily as needed for erectile dysfunction.      Home: Home Living Family/patient expects to be discharged to:: Private residence Living Arrangements: Non-relatives/Friends(girlfriend) Available Help at Discharge: Friend(s), Available 24 hours/day(girlfriend returns Apr20) Type of Home: House Home Access: Stairs to enter [employer] of Steps: 7(family states working on ramp) Entrance Stairs-Rails: Right, Left, Can reach both Home Layout: Two level, Able to live on main level with bedroom/bathroom Alternate Level Stairs-Number of Steps: 13 Alternate Level Stairs-Rails: Right Bathroom Toilet: Standard Bathroom Accessibility: Yes Home Equipment: [place] - single point, Walker - 2 wheels  Functional History: Prior Function Level of Independence: Independent Functional Status:  Mobility: Bed Mobility Overal bed mobility: Needs Assistance Bed Mobility: Rolling, Sidelying to Sit, Sit to Sidelying Rolling: Max assist, +2 for physical assistance Sidelying to sit: Max assist, +2 for physical assistance Supine to sit: Mod  assist Sit to supine: Mod assist Sit to sidelying: Max assist, +2 for physical assistance General bed mobility comments: +rail, assist with BLE and to elevate trunk; cues for technique, increased time Transfers Overall transfer level: Needs assistance Equipment used: Rolling walker (2 wheeled), 2 person hand held assist Transfer via Lift Equipment: Stedy(sara stedy) Transfers: Sit to/from Stand Sit to Stand: Max assist, +2 physical assistance, From elevated surface Stand pivot transfers: Min assist  Lateral/Scoot Transfers: Mod assist General transfer comment: Max A +2 to clear bottom from EOB. DUring second attempt, pt able to raise bottom enough to bring bari  stedy seat down. Pt fatiguing quickly and then requiring Total A to laterally lean and remove stedy seat. Ambulation/Gait Ambulation/Gait assistance: Min assist, Mod assist Ambulation Distance (Feet): 5 Feet Assistive device: Rolling walker (2 wheeled) Gait Pattern/deviations: Decreased step length - right, Decreased step length - left, Decreased stride length General Gait Details: unable Gait velocity interpretation: Below normal speed for age/gender  ADL: ADL Overall ADL's : Needs assistance/impaired Eating/Feeding: Set up, Bed level Grooming: Set up, Bed level Upper Body Bathing: Moderate assistance, Bed level Lower Body Bathing: Maximal assistance, +2 for physical assistance, Bed level Upper Body Dressing : Moderate assistance, Bed level Lower Body Dressing: Maximal assistance, +2 for physical assistance, Bed level Toileting- Clothing Manipulation and Hygiene: Maximal assistance, +2 for physical assistance, Bed level Toileting - Clothing Manipulation Details (indicate cue type and reason): Pt rolling side to side with Max A and second person performing toilet hygiene with Max A Functional mobility during ADLs: Total assistance, +2 for physical assistance(with bari stedy) General ADL Comments: Pt requiring Max A for  bathing, dressing, and toileting. Pt unaware that he had BM during bed mobility and sit<>stand.   Cognition: Cognition Overall Cognitive Status: Within Functional Limits for tasks assessed Orientation Level: Oriented X4 Cognition Arousal/Alertness: Awake/alert Behavior During Therapy: WFL for tasks assessed/performed Overall Cognitive Status: Within Functional Limits for tasks assessed General Comments: Daughter (who is RN at [employer] hospital) was present throughout session  Blood pressure 113/66, pulse (!) 103, temperature 98.4 F (36.9 C), temperature source Oral, resp. rate (!) 30, height 6\' 3"  (1.905 m), weight 130.4 kg (287 lb 7.7 oz), SpO2 93 %.           Physical Exam  Vitals reviewed. Constitutional: He is oriented to person, place, and time.  obese  HENT:  Head: Normocephalic.  Eyes: EOM are normal. Right eye exhibits no discharge. Left eye exhibits no discharge.  Neck: Normal range of motion. Neck supple. No thyromegaly present.  Cardiovascular: Normal rate, regular rhythm and normal heart sounds.  Respiratory: Effort normal and breath sounds normal. No respiratory distress.  GI: Soft. Bowel sounds are normal. He exhibits no distension.  Neurological: He is alert and oriented to person, place, and time.  UE motor 5/5. Sensory level in mid abdomen. RLE: 3 to 4/5 prox to distal. LLE: 1-2/5 HF, KE and 2/5 ADF/PF.   Skin:  Back incision is dressed  Psychiatric: He has a normal mood and affect. His behavior is normal.    LabResultsLast24Hours  No results found for this or any previous visit (from the past 24 hour(s)).    ImagingResults(Last48hours)  Dg Thoracolumabar Spine  Result Date: 03/28/2018 CLINICAL DATA:  T8 through L2 fixation. FLUOROSCOPY TIME:  1 minutes 8 seconds. EXAM: THORACOLUMBAR SPINE 1V COMPARISON:  CT scan of March 20, 2018. FINDINGS: Status post surgical posterior fusion extending from T8 to L2 with bilateral intrapedicular screw  placement. Good alignment of vertebral bodies is noted. IMPRESSION: Status post surgical posterior fusion of thoracolumbar junction as described above. Electronically Signed   By: Marijo Conception, M.D.   On: 03/28/2018 13:32   Dg Lumbar Spine 1 View  Result Date: 03/28/2018 CLINICAL DATA:  T8 through L2 fusion EXAM: LUMBAR SPINE - 1 VIEW COMPARISON:  None. FINDINGS: Single cross-table lateral x-ray of the lumbar spine is provided. Posterior tissue retractors are noted. Posterior lumbar interbody fusion from L1 through L3. Interbody fusion at L3-4. Interbody cages at L4-5 and L5-S1. IMPRESSION: Intraoperative localization as described above. Electronically Signed  By: Kathreen Devoid   On: 03/28/2018 09:37   Dg C-arm 1-60 Min  Result Date: 03/28/2018 CLINICAL DATA:  T8 through L2 fixation. FLUOROSCOPY TIME:  1 minutes 8 seconds. EXAM: THORACOLUMBAR SPINE 1V COMPARISON:  CT scan of March 20, 2018. FINDINGS: Status post surgical posterior fusion extending from T8 to L2 with bilateral intrapedicular screw placement. Good alignment of vertebral bodies is noted. IMPRESSION: Status post surgical posterior fusion of thoracolumbar junction as described above. Electronically Signed   By: Marijo Conception, M.D.   On: 03/28/2018 13:32   Dg C-arm 1-60 Min  Result Date: 03/28/2018 CLINICAL DATA:  T8 through L2 fixation. FLUOROSCOPY TIME:  1 minutes 8 seconds. EXAM: THORACOLUMBAR SPINE 1V COMPARISON:  CT scan of March 20, 2018. FINDINGS: Status post surgical posterior fusion extending from T8 to L2 with bilateral intrapedicular screw placement. Good alignment of vertebral bodies is noted. IMPRESSION: Status post surgical posterior fusion of thoracolumbar junction as described above. Electronically Signed   By: Marijo Conception, M.D.   On: 03/28/2018 13:32     Assessment/Plan: Diagnosis: T10-11 osteomyelitis with subsequent myelopathy/paraplegia, s/p decompression and fusion T8-L3 1. Does the need for close, 24  hr/day medical supervision in concert with the patient's rehab needs make it unreasonable for this patient to be served in a less intensive setting? Yes 2. Co-Morbidities requiring supervision/potential complications: HTN, neurogenic bowel and bladder, pain mgt, wound care 3. Due to bladder management, bowel management, safety, skin/wound care, disease management, medication administration, pain management and patient education, does the patient require 24 hr/day rehab nursing? Yes 4. Does the patient require coordinated care of a physician, rehab nurse, PT (1-2 hrs/day, 5 days/week) and OT (1-2 hrs/day, 5 days/week) to address physical and functional deficits in the context of the above medical diagnosis(es)? Yes Addressing deficits in the following areas: balance, endurance, locomotion, strength, transferring, bowel/bladder control, bathing, dressing, feeding, grooming, toileting and psychosocial support 5. Can the patient actively participate in an intensive therapy program of at least 3 hrs of therapy per day at least 5 days per week? Yes 6. The potential for patient to make measurable gains while on inpatient rehab is excellent 7. Anticipated functional outcomes upon discharge from inpatient rehab are modified independent, supervision and min assist  with PT, modified independent, supervision and min assist with OT, n/a with SLP. 8. Estimated rehab length of stay to reach the above functional goals is: 18-24 days 9. Anticipated D/C setting: Home 10. Anticipated post D/C treatments: [facility] therapy 11. Overall Rehab/Functional Prognosis: excellent  RECOMMENDATIONS: This patient's condition is appropriate for continued rehabilitative care in the following setting: CIR Patient has agreed to participate in recommended program. Yes Note that insurance prior authorization may be required for reimbursement for recommended care.  Comment: Rehab Admissions Coordinator to follow up.  Thanks,  [name], MD, Mellody Drown    Lavon Paganini Angiulli, PA-C 03/30/2018          Revision History                        Routing History

## 2018-03-30 NOTE — Progress Notes (Signed)
Rehab admissions - I met with patient and I spoke with his daughter, [name], by phone.  Both are in agreement to inpatient rehab admission.  I spoke with Dr. Rai who has cleared patient medically.  Bed available and will admit to inpatient rehab today.  Call me for questions.  #[phone] 

## 2018-03-30 NOTE — Progress Notes (Signed)
Pt. Wearing his home cpap with 2L of oxygen.

## 2018-03-30 NOTE — Consult Note (Signed)
Physical Medicine and Rehabilitation Consult Reason for Consult: Decreased functional mobility Referring Physician: [facility]   HPI: Cody Velez is a 72 y.o. right-handed male with history of hypertension, memory deficits maintained on Aricept and Namenda, prostate cancer, PTSD, chronic back pain with multiple lumbar surgeries with recent spinal cord stimulator placement January 2019 as well as treatment for recent UTI.   Per chart review patient lives with girlfriend.  Independent with a cane prior to admission and still driving.  2 level home with bedroom on main and 7 steps to entry with plan for ramp.  Girlfriend can assist as needed however she is currently on vacation in [place] until April 20.  Presented 03/16/2018 with lethargy/fall as well as hypoxemia with paraparesis.  CT Angio of the chest negative for pulmonary emboli.  Cranial CT scan negative for acute changes.  Echocardiogram with ejection fraction of 60%.  Systolic function was normal.  EEG negative.  X-rays and imaging revealed osteomyelitis T10-T11 with epidural abscess.  Infectious disease consulted for bacteremia due to MSSA and maintained on broad-spectrum antibiotics of intravenous ceftezole and with plan for 8 weeks duration ending May 22, 2018.  Patient underwent laminectomy T10-T11 decompression of epidural abscess debridement of discitis T10-T11 with segmental fixation from T8-L3 03/28/2018 per Dr. Ellene Route.  TLSO back brace applied in sitting position.  Hospital course pain management.  Physical therapy evaluation completed 03/29/2018 with recommendations of physical medicine rehab consult.   Review of Systems  Constitutional: Positive for malaise/fatigue.  HENT: Negative for hearing loss.   Eyes: Negative for blurred vision and double vision.  Respiratory: Negative for cough.        Shortness of breath with heavy exertion  Cardiovascular: Positive for leg swelling. Negative for chest pain and palpitations.    Gastrointestinal: Positive for constipation. Negative for nausea and vomiting.       GERD  Genitourinary: Positive for urgency. Negative for flank pain and hematuria.  Musculoskeletal: Positive for back pain and falls.  Skin: Negative for rash.  Neurological: Positive for weakness. Negative for seizures.  Psychiatric/Behavioral: Positive for memory loss.  All other systems reviewed and are negative.  Past Medical History:  Diagnosis Date  . Allergic rhinitis   . Arthritis   . Asthma    as a child  . B12 deficiency   . Cancer [facility])    prostate  . Cervicogenic headache 01/28/2016  . Chronic back pain   . Coronary atherosclerosis of native coronary artery    Mild nonobstructive CAD at catheterization January 2015  . Depression   . Essential hypertension, benign   . Falls   . GERD (gastroesophageal reflux disease)   . History of cerebrovascular disease 07/23/2015  . History of pneumonia 02/2011  . Hyperlipidemia   . Kidney stone   . Memory difficulty 07/23/2015  . OSA (obstructive sleep apnea)    CPAP - Dr. [name]  . Prostate cancer ([place])   . PTSD (post-traumatic stress disorder)    [place]  . Rectal bleeding    Past Surgical History:  Procedure Laterality Date  . BACK SURGERY  02/14/12   lumbar OR #7; "today redid L1L2; replaced screws; added bone from hip"  . BILATERAL KNEE ARTHROSCOPY    . COLONOSCOPY  10/15/2008   Dr. [name]: tubular adenoma   . COLONOSCOPY  12/17/2003   OZH:YQMVHQ rectal and colon  . COLONOSCOPY N/A 09/05/2014   Procedure: COLONOSCOPY;  Surgeon: Daneil Dolin, MD;  Location: [facility];  Service: Endoscopy;  Laterality: N/A;  7:30-rescheduled 9/17 to [place] notified pt  . CYSTOSCOPY WITH STENT PLACEMENT Right 01/27/2016   Procedure: CYSTOSCOPY WITH STENT PLACEMENT;  Surgeon: [name], MD;  Location: [facility] ORS;  Service: Urology;  Laterality: Right;  . CYSTOSCOPY/RETROGRADE/URETEROSCOPY/STONE EXTRACTION WITH BASKET Right 01/27/2016    Procedure: CYSTOSCOPY, RIGHT RETROGRADE, RIGHT URETEROSCOPY, STONE EXTRACTION ;  Surgeon: [name], MD;  Location: [facility] ORS;  Service: Urology;  Laterality: Right;  . ESOPHAGOGASTRODUODENOSCOPY  10/15/2008     Dr Rourk:Schatzki's ring status post dilation and disruption via 63 F Maloney dilator/ otherwise unremarkable esophagus, small hiatal hernia, multiple fundal gland polyps not manipulated, gastritis, negative H.pylori  . ESOPHAGOGASTRODUODENOSCOPY  06/21/02   VOZ:DGUYQ sliding hiatal hernia with mild changes of reflux esophagitis limited to gastroesophageal junction.  Noncritical ring at distal esophagus, 3 cm proximal to gastroesophageal junction/Antral gastritis  . [place]   "broke face playing softball"  . FRACTURE SURGERY     "left wrist; broke it; took spur off"  . HOLMIUM LASER APPLICATION Right 0/02/4741   Procedure: HOLMIUM LASER APPLICATION;  Surgeon: [name], MD;  Location: [facility] ORS;  Service: Urology;  Laterality: Right;  . LEFT HEART CATHETERIZATION WITH CORONARY ANGIOGRAM N/A 12/27/2013   Procedure: LEFT HEART CATHETERIZATION WITH CORONARY ANGIOGRAM;  Surgeon: [name], MD;  Location: [facility] CATH LAB;  Service: Cardiovascular;  Laterality: N/A;  . neck epidural    . SHOULDER SURGERY Bilateral   . TEE WITHOUT CARDIOVERSION N/A 03/21/2018   Procedure: TRANSESOPHAGEAL ECHOCARDIOGRAM (TEE) WITH PROPOFOL;  Surgeon: [name], MD;  Location[facility];  Service: Cardiovascular;  Laterality: N/A;   Family History  Problem Relation Age of Onset  . Emphysema Father   . Heart failure Father   . Lung cancer Father   . CAD Father   . Colon cancer Mother   . Stroke Mother   . Breast cancer Mother   . Stroke Sister   . Heart attack Brother   . Dementia Paternal Uncle   . Emphysema Maternal Grandmother   . Stroke Maternal Grandmother   . Asthma Other        grandson  . Heart disease Paternal Grandfather   . Anesthesia problems Neg  Hx   . Hypotension Neg Hx   . Malignant hyperthermia Neg Hx   . Pseudochol deficiency Neg Hx    Social History:  reports that he quit smoking about 59 years ago. His smoking use included cigarettes. His smokeless tobacco use includes chew. He reports that he drinks alcohol. He reports that he does not use drugs. Allergies: No Known Allergies Medications Prior to Admission  Medication Sig Dispense Refill  . amLODipine (NORVASC) 10 MG tablet Take 10 mg by mouth daily.    Marland Kitchen aspirin 81 MG tablet Take 81 mg by mouth daily.    . baclofen (LIORESAL) 10 MG tablet Take 10 mg by mouth 3 (three) times daily.    . cycloSPORINE (RESTASIS) 0.05 % ophthalmic emulsion 1 DROP INTO BOTH EYES TWICE A DAY FOR DRY EYES    . docusate sodium (COLACE) 100 MG capsule Take 200 mg by mouth 2 (two) times daily.    Marland Kitchen donepezil (ARICEPT) 10 MG tablet Take 1 tablet (10 mg total) by mouth at bedtime. 90 tablet 3  . doxycycline (VIBRA-TABS) 100 MG tablet Take 100 mg by mouth 2 (two) times daily. 7 day course starting on 03/15/18    . finasteride (PROSCAR) 5  MG tablet Take 5 mg by mouth daily. Reported on 05/04/2016    . fluticasone (FLONASE) 50 MCG/ACT nasal spray Place 1 spray into both nostrils daily as needed for allergies.     Marland Kitchen gabapentin (NEURONTIN) 300 MG capsule Take 3 capsules (900 mg total) by mouth 3 (three) times daily. 810 capsule 3  . ibuprofen (ADVIL,MOTRIN) 600 MG tablet Take 600 mg by mouth 3 (three) times daily.    Marland Kitchen latanoprost (XALATAN) 0.005 % ophthalmic solution Place 1 drop into both eyes at bedtime.     Marland Kitchen losartan (COZAAR) 100 MG tablet Take 100 mg by mouth every morning.     . memantine (NAMENDA) 10 MG tablet Take 1 tablet (10 mg total) by mouth 2 (two) times daily. 180 tablet 3  . omeprazole (PRILOSEC) 20 MG capsule Take 40 mg by mouth 2 (two) times daily before a meal.     . oxyCODONE-acetaminophen (PERCOCET/ROXICET) 5-325 MG tablet Take 2 tablets by mouth every 6 (six) hours as needed for severe  pain.    . pravastatin (PRAVACHOL) 10 MG tablet Take 10 mg by mouth every evening.     . traZODone (DESYREL) 100 MG tablet Take 100 mg by mouth at bedtime.    Marland Kitchen venlafaxine XR (EFFEXOR-XR) 150 MG 24 hr capsule Take 150 mg by mouth 2 (two) times daily.     . vitamin B-12 (CYANOCOBALAMIN) 1000 MCG tablet Take 1,000 mcg by mouth daily.    . sildenafil (VIAGRA) 100 MG tablet Take 100 mg by mouth daily as needed for erectile dysfunction.      Home: Home Living Family/patient expects to be discharged to:: Private residence Living Arrangements: Non-relatives/Friends(girlfriend) Available Help at Discharge: Friend(s), Available 24 hours/day(girlfriend returns Apr20) Type of Home: House Home Access: Stairs to enter [employer] of Steps: 7(family states working on ramp) Entrance Stairs-Rails: Right, Left, Can reach both Home Layout: Two level, Able to live on main level with bedroom/bathroom Alternate Level Stairs-Number of Steps: 13 Alternate Level Stairs-Rails: Right Bathroom Toilet: Standard Bathroom Accessibility: Yes Home Equipment: [place] - single point, Walker - 2 wheels  Functional History: Prior Function Level of Independence: Independent Functional Status:  Mobility: Bed Mobility Overal bed mobility: Needs Assistance Bed Mobility: Rolling, Sidelying to Sit, Sit to Sidelying Rolling: Max assist, +2 for physical assistance Sidelying to sit: Max assist, +2 for physical assistance Supine to sit: Mod assist Sit to supine: Mod assist Sit to sidelying: Max assist, +2 for physical assistance General bed mobility comments: +rail, assist with BLE and to elevate trunk; cues for technique, increased time Transfers Overall transfer level: Needs assistance Equipment used: Rolling walker (2 wheeled), 2 person hand held assist Transfer via Lift Equipment: Stedy(sara stedy) Transfers: Sit to/from Stand Sit to Stand: Max assist, +2 physical assistance, From elevated surface Stand  pivot transfers: Min assist  Lateral/Scoot Transfers: Mod assist General transfer comment: Max A +2 to clear bottom from EOB. DUring second attempt, pt able to raise bottom enough to bring bari stedy seat down. Pt fatiguing quickly and then requiring Total A to laterally lean and remove stedy seat. Ambulation/Gait Ambulation/Gait assistance: Min assist, Mod assist Ambulation Distance (Feet): 5 Feet Assistive device: Rolling walker (2 wheeled) Gait Pattern/deviations: Decreased step length - right, Decreased step length - left, Decreased stride length General Gait Details: unable Gait velocity interpretation: Below normal speed for age/gender    ADL: ADL Overall ADL's : Needs assistance/impaired Eating/Feeding: Set up, Bed level Grooming: Set up, Bed level Upper Body Bathing: Moderate  assistance, Bed level Lower Body Bathing: Maximal assistance, +2 for physical assistance, Bed level Upper Body Dressing : Moderate assistance, Bed level Lower Body Dressing: Maximal assistance, +2 for physical assistance, Bed level Toileting- Clothing Manipulation and Hygiene: Maximal assistance, +2 for physical assistance, Bed level Toileting - Clothing Manipulation Details (indicate cue type and reason): Pt rolling side to side with Max A and second person performing toilet hygiene with Max A Functional mobility during ADLs: Total assistance, +2 for physical assistance(with bari stedy) General ADL Comments: Pt requiring Max A for bathing, dressing, and toileting. Pt unaware that he had BM during bed mobility and sit<>stand.   Cognition: Cognition Overall Cognitive Status: Within Functional Limits for tasks assessed Orientation Level: Oriented X4 Cognition Arousal/Alertness: Awake/alert Behavior During Therapy: WFL for tasks assessed/performed Overall Cognitive Status: Within Functional Limits for tasks assessed General Comments: Daughter (who is RN at [employer] hospital) was present throughout  session  Blood pressure 113/66, pulse (!) 103, temperature 98.4 F (36.9 C), temperature source Oral, resp. rate (!) 30, height 6\' 3"  (1.905 m), weight 130.4 kg (287 lb 7.7 oz), SpO2 93 %. Physical Exam  Vitals reviewed. Constitutional: He is oriented to person, place, and time.  obese  HENT:  Head: Normocephalic.  Eyes: EOM are normal. Right eye exhibits no discharge. Left eye exhibits no discharge.  Neck: Normal range of motion. Neck supple. No thyromegaly present.  Cardiovascular: Normal rate, regular rhythm and normal heart sounds.  Respiratory: Effort normal and breath sounds normal. No respiratory distress.  GI: Soft. Bowel sounds are normal. He exhibits no distension.  Neurological: He is alert and oriented to person, place, and time.  UE motor 5/5. Sensory level in mid abdomen. RLE: 3 to 4/5 prox to distal. LLE: 1-2/5 HF, KE and 2/5 ADF/PF.   Skin:  Back incision is dressed  Psychiatric: He has a normal mood and affect. His behavior is normal.    No results found for this or any previous visit (from the past 24 hour(s)). Dg Thoracolumabar Spine  Result Date: 03/28/2018 CLINICAL DATA:  T8 through L2 fixation. FLUOROSCOPY TIME:  1 minutes 8 seconds. EXAM: THORACOLUMBAR SPINE 1V COMPARISON:  CT scan of March 20, 2018. FINDINGS: Status post surgical posterior fusion extending from T8 to L2 with bilateral intrapedicular screw placement. Good alignment of vertebral bodies is noted. IMPRESSION: Status post surgical posterior fusion of thoracolumbar junction as described above. Electronically Signed   By: Marijo Conception, M.D.   On: 03/28/2018 13:32   Dg Lumbar Spine 1 View  Result Date: 03/28/2018 CLINICAL DATA:  T8 through L2 fusion EXAM: LUMBAR SPINE - 1 VIEW COMPARISON:  None. FINDINGS: Single cross-table lateral x-ray of the lumbar spine is provided. Posterior tissue retractors are noted. Posterior lumbar interbody fusion from L1 through L3. Interbody fusion at L3-4. Interbody cages at  L4-5 and L5-S1. IMPRESSION: Intraoperative localization as described above. Electronically Signed   By: Kathreen Devoid   On: 03/28/2018 09:37   Dg C-arm 1-60 Min  Result Date: 03/28/2018 CLINICAL DATA:  T8 through L2 fixation. FLUOROSCOPY TIME:  1 minutes 8 seconds. EXAM: THORACOLUMBAR SPINE 1V COMPARISON:  CT scan of March 20, 2018. FINDINGS: Status post surgical posterior fusion extending from T8 to L2 with bilateral intrapedicular screw placement. Good alignment of vertebral bodies is noted. IMPRESSION: Status post surgical posterior fusion of thoracolumbar junction as described above. Electronically Signed   By: Marijo Conception, M.D.   On: 03/28/2018 13:32   Dg C-arm 1-60 Min  Result Date: 03/28/2018 CLINICAL DATA:  T8 through L2 fixation. FLUOROSCOPY TIME:  1 minutes 8 seconds. EXAM: THORACOLUMBAR SPINE 1V COMPARISON:  CT scan of March 20, 2018. FINDINGS: Status post surgical posterior fusion extending from T8 to L2 with bilateral intrapedicular screw placement. Good alignment of vertebral bodies is noted. IMPRESSION: Status post surgical posterior fusion of thoracolumbar junction as described above. Electronically Signed   By: Marijo Conception, M.D.   On: 03/28/2018 13:32    Assessment/Plan: Diagnosis: T10-11 osteomyelitis with subsequent myelopathy/paraplegia, s/p decompression and fusion T8-L3 1. Does the need for close, 24 hr/day medical supervision in concert with the patient's rehab needs make it unreasonable for this patient to be served in a less intensive setting? Yes 2. Co-Morbidities requiring supervision/potential complications: HTN, neurogenic bowel and bladder, pain mgt, wound care 3. Due to bladder management, bowel management, safety, skin/wound care, disease management, medication administration, pain management and patient education, does the patient require 24 hr/day rehab nursing? Yes 4. Does the patient require coordinated care of a physician, rehab nurse, PT (1-2 hrs/day, 5  days/week) and OT (1-2 hrs/day, 5 days/week) to address physical and functional deficits in the context of the above medical diagnosis(es)? Yes Addressing deficits in the following areas: balance, endurance, locomotion, strength, transferring, bowel/bladder control, bathing, dressing, feeding, grooming, toileting and psychosocial support 5. Can the patient actively participate in an intensive therapy program of at least 3 hrs of therapy per day at least 5 days per week? Yes 6. The potential for patient to make measurable gains while on inpatient rehab is excellent 7. Anticipated functional outcomes upon discharge from inpatient rehab are modified independent, supervision and min assist  with PT, modified independent, supervision and min assist with OT, n/a with SLP. 8. Estimated rehab length of stay to reach the above functional goals is: 18-24 days 9. Anticipated D/C setting: Home 10. Anticipated post D/C treatments: [place] therapy 11. Overall Rehab/Functional Prognosis: excellent  RECOMMENDATIONS: This patient's condition is appropriate for continued rehabilitative care in the following setting: CIR Patient has agreed to participate in recommended program. Yes Note that insurance prior authorization may be required for reimbursement for recommended care.  Comment: Rehab Admissions Coordinator to follow up.  Thanks,  [name], MD, Mellody Drown    Lavon Paganini Angiulli, PA-C 03/30/2018

## 2018-03-30 NOTE — Progress Notes (Signed)
Patient ID: Cody Velez, male   DOB: Apr 04, 1946, 72 y.o.   MRN: [id number] Vital signs are good Dressing changed today,removed drain and painted incision with betadine. Motor function is 2/5 on left weak but present 3/5 on right suggesting some slow improvement. Encouragement given.

## 2018-03-30 NOTE — Care Management Note (Signed)
Case Management Note  Patient Details  Name: Cody Velez MRN: [id number] Date of Birth: 1946-01-09  Subjective/Objective:   Cody Velez is a 72 y.o. male admitted for weakness and fall. Pt with osteo and epidural abscess T10-11 s/p lami and fixation T8-L3. Pt with significant bil LE weakness post op.   PTA, pt independent, lives with girlfriend.                  Action/Plan: Pt with friends available to assist 24h/day at dc.  PT/OT recommending CIR.   Expected Discharge Date:  03/30/18               Expected Discharge Plan:  [place]  In-House Referral:     Discharge planning Services  CM Consult  Post Acute Care Choice:    Choice offered to:     DME Arranged:    DME Agency:     HH Arranged:    HH Agency:     Status of Service:  Completed, signed off  If discussed at [employer] [facility] [employer], dates discussed:    Additional Comments:  03/30/18 J. Tiffancy Moger, RN, BSN Pt medically stable for [employer] today; plan dc to [facility] later today.  Ella Bodo, RN 03/30/2018, 1:50 PM

## 2018-03-30 NOTE — Progress Notes (Addendum)
Subjective:  No new complaints  Antibiotics:  Anti-infectives (From admission, onward)   Start     Dose/Rate Route Frequency Ordered Stop   03/30/18 1015  rifampin (RIFADIN) capsule 300 mg     300 mg Oral Every 12 hours 03/30/18 1003     03/28/18 1530  ceFAZolin (ANCEF) IVPB 2g/100 mL premix  Status:  Discontinued     2 g 200 mL/hr over 30 Minutes Intravenous Every 8 hours 03/28/18 1516 03/28/18 1537   03/28/18 0710  bacitracin 50,000 Units in sodium chloride 0.9 % 500 mL irrigation  Status:  Discontinued       As needed 03/28/18 0851 03/28/18 1408   03/18/18 1800  ceFAZolin (ANCEF) IVPB 2g/100 mL premix     2 g 200 mL/hr over 30 Minutes Intravenous Every 8 hours 03/18/18 1649     03/17/18 0400  vancomycin (VANCOCIN) 1,500 mg in sodium chloride 0.9 % 500 mL IVPB  Status:  Discontinued     1,500 mg 250 mL/hr over 120 Minutes Intravenous Every 24 hours 03/16/18 1113 03/16/18 1120   03/17/18 0000  vancomycin (VANCOCIN) 1,500 mg in sodium chloride 0.9 % 500 mL IVPB  Status:  Discontinued     1,500 mg 250 mL/hr over 120 Minutes Intravenous Every 24 hours 03/16/18 1120 03/18/18 1553   03/16/18 0500  piperacillin-tazobactam (ZOSYN) IVPB 3.375 g  Status:  Discontinued     3.375 g 12.5 mL/hr over 240 Minutes Intravenous Every 8 hours 03/16/18 0127 03/18/18 1553   03/16/18 0130  vancomycin (VANCOCIN) IVPB 1000 mg/200 mL premix     1,000 mg 200 mL/hr over 60 Minutes Intravenous Every 1 hr x 2 03/16/18 0126 03/16/18 0602   03/15/18 2030  piperacillin-tazobactam (ZOSYN) IVPB 3.375 g     3.375 g 100 mL/hr over 30 Minutes Intravenous  Once 03/15/18 2017 03/15/18 2118      Medications: Scheduled Meds: . amLODipine  10 mg Oral Daily  . aspirin EC  81 mg Oral Daily  . chlorhexidine  15 mL Mouth Rinse BID  . cycloSPORINE  1 drop Both Eyes BID  . docusate sodium  100 mg Oral BID  . donepezil  10 mg Oral QHS  . feeding supplement (ENSURE ENLIVE)  237 mL Oral BID BM  . finasteride   5 mg Oral Daily  . gabapentin  600 mg Oral TID  . ipratropium-albuterol  3 mL Nebulization BID  . latanoprost  1 drop Both Eyes QHS  . magnesium citrate  1 Bottle Oral Once  . mouth rinse  15 mL Mouth Rinse q12n4p  . memantine  10 mg Oral BID  . pantoprazole  40 mg Oral Daily  . pravastatin  10 mg Oral QPM  . rifampin  300 mg Oral Q12H  . senna  1 tablet Oral BID  . sodium chloride flush  10-40 mL Intracatheter Q12H  . sodium chloride flush  3 mL Intravenous Q12H  . traZODone  100 mg Oral QHS  . venlafaxine XR  150 mg Oral BID  . vitamin B-12  1,000 mcg Oral Daily   Continuous Infusions: . sodium chloride    .  ceFAZolin (ANCEF) IV 2 g (03/30/18 1030)  . lactated ringers 125 mL/hr at 03/30/18 0715  . methocarbamol (ROBAXIN)  IV     PRN Meds:.acetaminophen **OR** acetaminophen, albuterol, alum & mag hydroxide-simeth, bisacodyl, bisacodyl, fluticasone, hydrALAZINE, HYDROmorphone (DILAUDID) injection, menthol-cetylpyridinium **OR** phenol, methocarbamol **OR** methocarbamol (ROBAXIN)  IV, morphine injection, naLOXone (  NARCAN)  injection, ondansetron **OR** ondansetron (ZOFRAN) IV, oxyCODONE, oxyCODONE, oxyCODONE-acetaminophen, polyethylene glycol, sodium chloride flush, sodium chloride flush, sodium phosphate    Objective: Weight change:   Intake/Output Summary (Last 24 hours) at 03/30/2018 1137 Last data filed at 03/30/2018 1105 Gross per 24 hour  Intake 2925 ml  Output 1620 ml  Net 1305 ml   Blood pressure (!) 102/50, pulse 86, temperature 98.5 F (36.9 C), temperature source Oral, resp. rate 13, height '6\' 3"'  (1.905 m), weight 287 lb 7.7 oz (130.4 kg), SpO2 95 %. Temp:  [98.4 F (36.9 C)-98.9 F (37.2 C)] 98.5 F (36.9 C) (04/11 0758) Pulse Rate:  [84-103] 86 (04/11 0800) Resp:  [12-30] 13 (04/11 0800) BP: (102-151)/(50-94) 102/50 (04/11 0800) SpO2:  [91 %-100 %] 95 % (04/11 1017)  Physical Exam: General: Alert and awake, oriented x3, not in any acute distress, lying on  side HEENT: anicteric sclera,, EOMI CVS regular rate, normal r,  no murmur rubs or gallops Chest: clear to auscultation bilaterally, no wheezing, rales or rhonchi Abdomen: soft nontender, nondistended, normal bowel sounds, Extremities: no  clubbing or edema noted bilaterally Skin: no rashes, PICC is clean  Neuro: he is sitting in chair today  CBC: CBC Latest Ref Rng & Units 03/29/2018 03/28/2018 03/28/2018  WBC 4.0 - 10.5 K/uL 12.8(H) 15.9(H) -  Hemoglobin 13.0 - 17.0 g/dL 8.9(L) 9.7(L) 10.9(L)  Hematocrit 39.0 - 52.0 % 29.6(L) 31.2(L) 32.0(L)  Platelets 150 - 400 K/uL 619(H) 722(H) -      BMET Recent Labs    03/28/18 1723 03/29/18 0439  NA 133* 131*  K 5.7* 4.7  CL 100* 97*  CO2 24 25  GLUCOSE 138* 117*  BUN 19 19  CREATININE 1.09 1.03  CALCIUM 8.9 8.7*     Liver Panel  Recent Labs    03/28/18 1723 03/29/18 0439  PROT 7.4 6.7  ALBUMIN 2.6* 2.3*  AST 44* 43*  ALT 20 17  ALKPHOS 87 83  BILITOT 0.5 0.4       Sedimentation Rate No results for input(s): ESRSEDRATE in the last 72 hours. C-Reactive Protein No results for input(s): CRP in the last 72 hours.  Micro Results: Recent Results (from the past 720 hour(s))  Urine culture     Status: Abnormal   Collection Time: 03/15/18  6:30 PM  Result Value Ref Range Status   Specimen Description   Final    URINE, RANDOM Performed at [facility], [address] [place], [place] [address]    Special Requests   Final    NONE Performed at [facility], [address] [place], [place] [address]    Culture (A)  Final    <10,000 COLONIES/mL INSIGNIFICANT GROWTH Performed at [place] [address] [place], [place] [address]    Report Status 03/17/2018 FINAL  Final  Culture, blood (routine x 2)     Status: Abnormal   Collection Time: 03/15/18  8:00 PM  Result Value Ref Range Status   Specimen Description   Final    BLOOD RIGHT WRIST Performed at [facility], [place] [address] [place],  [place] [address]    Special Requests   Final    BOTTLES DRAWN AEROBIC AND ANAEROBIC Blood Culture adequate volume Performed at [facility], [address] [place], [place] [address]    Culture  Setup Time   Final    GRAM POSITIVE COCCI IN BOTH AEROBIC AND ANAEROBIC BOTTLES Performed at [facility] Gram Stain Report  Called to,Read Back By and Verified With: JOHNSON,B'@0618'  BY MATTHEWS, B 3.30.19 CRITICAL RESULT CALLED TO, READ BACK BY AND VERIFIED WITH: NATE HAYES,PHARMD [id number] BY SJW Performed at [facility], [place] [address] [place], [place] [address]    Culture STAPHYLOCOCCUS AUREUS (A)  Final   Report Status 03/20/2018 FINAL  Final   Organism ID, Bacteria STAPHYLOCOCCUS AUREUS  Final      Susceptibility   Staphylococcus aureus - MIC*    CIPROFLOXACIN <=0.5 SENSITIVE Sensitive     ERYTHROMYCIN 0.5 SENSITIVE Sensitive     GENTAMICIN <=0.5 SENSITIVE Sensitive     OXACILLIN <=0.25 SENSITIVE Sensitive     TETRACYCLINE <=1 SENSITIVE Sensitive     VANCOMYCIN <=0.5 SENSITIVE Sensitive     TRIMETH/SULFA <=10 SENSITIVE Sensitive     CLINDAMYCIN <=0.25 SENSITIVE Sensitive     RIFAMPIN <=0.5 SENSITIVE Sensitive     Inducible Clindamycin NEGATIVE Sensitive     * STAPHYLOCOCCUS AUREUS  Blood Culture ID Panel (Reflexed)     Status: Abnormal   Collection Time: 03/15/18  8:00 PM  Result Value Ref Range Status   Enterococcus species NOT DETECTED NOT DETECTED Final   Listeria monocytogenes NOT DETECTED NOT DETECTED Final   Staphylococcus species DETECTED (A) NOT DETECTED Final    Comment: CRITICAL RESULT CALLED TO, READ BACK BY AND VERIFIED WITH: NATE HAYES PHARMD AT 0954 ON [id number] BY SJW    Staphylococcus aureus DETECTED (A) NOT DETECTED Final    Comment: CRITICAL RESULT CALLED TO, READ BACK BY AND VERIFIED WITH: NATE HAYES PHARMD AT 0954 ON [id number] BY SJW    Methicillin resistance NOT DETECTED NOT DETECTED Final   Streptococcus species NOT DETECTED NOT DETECTED Final    Streptococcus agalactiae NOT DETECTED NOT DETECTED Final   Streptococcus pneumoniae NOT DETECTED NOT DETECTED Final   Streptococcus pyogenes NOT DETECTED NOT DETECTED Final   Acinetobacter baumannii NOT DETECTED NOT DETECTED Final   Enterobacteriaceae species NOT DETECTED NOT DETECTED Final   Enterobacter cloacae complex NOT DETECTED NOT DETECTED Final   Escherichia coli NOT DETECTED NOT DETECTED Final   Klebsiella oxytoca NOT DETECTED NOT DETECTED Final   Klebsiella pneumoniae NOT DETECTED NOT DETECTED Final   Proteus species NOT DETECTED NOT DETECTED Final   Serratia marcescens NOT DETECTED NOT DETECTED Final   Haemophilus influenzae NOT DETECTED NOT DETECTED Final   Neisseria meningitidis NOT DETECTED NOT DETECTED Final   Pseudomonas aeruginosa NOT DETECTED NOT DETECTED Final   Candida albicans NOT DETECTED NOT DETECTED Final   Candida glabrata NOT DETECTED NOT DETECTED Final   Candida krusei NOT DETECTED NOT DETECTED Final   Candida parapsilosis NOT DETECTED NOT DETECTED Final   Candida tropicalis NOT DETECTED NOT DETECTED Final    Comment: Performed at [facility], [address], [place], [place] [address]  Culture, blood (routine x 2)     Status: None   Collection Time: 03/15/18  8:21 PM  Result Value Ref Range Status   Specimen Description BLOOD LEFT HAND  Final   Special Requests   Final    BOTTLES DRAWN AEROBIC AND ANAEROBIC Blood Culture adequate volume   Culture   Final    NO GROWTH 5 DAYS Performed at [facility], [address] [place], [place] [address]    Report Status 03/20/2018 FINAL  Final  Respiratory Panel by PCR     Status: None   Collection Time: 03/16/18 11:40 AM  Result Value Ref Range Status   Adenovirus NOT DETECTED NOT  DETECTED Final   Coronavirus 229E NOT DETECTED NOT DETECTED Final   Coronavirus HKU1 NOT DETECTED NOT DETECTED Final   Coronavirus NL63 NOT DETECTED NOT DETECTED Final   Coronavirus OC43 NOT DETECTED NOT DETECTED Final    Metapneumovirus NOT DETECTED NOT DETECTED Final   Rhinovirus / Enterovirus NOT DETECTED NOT DETECTED Final   Influenza A NOT DETECTED NOT DETECTED Final   Influenza B NOT DETECTED NOT DETECTED Final   Parainfluenza Virus 1 NOT DETECTED NOT DETECTED Final   Parainfluenza Virus 2 NOT DETECTED NOT DETECTED Final   Parainfluenza Virus 3 NOT DETECTED NOT DETECTED Final   Parainfluenza Virus 4 NOT DETECTED NOT DETECTED Final   Respiratory Syncytial Virus NOT DETECTED NOT DETECTED Final   Bordetella pertussis NOT DETECTED NOT DETECTED Final   Chlamydophila pneumoniae NOT DETECTED NOT DETECTED Final   Mycoplasma pneumoniae NOT DETECTED NOT DETECTED Final    Comment: Performed at [facility], [place] [address], [place], [place] [address]  Culture, blood (routine x 2)     Status: None   Collection Time: 03/18/18 12:38 PM  Result Value Ref Range Status   Specimen Description LEFT ANTECUBITAL  Final   Special Requests   Final    BOTTLES DRAWN AEROBIC AND ANAEROBIC Blood Culture adequate volume   Culture   Final    NO GROWTH 5 DAYS Performed at [facility], [address], [place], [place] [address]    Report Status 03/23/2018 FINAL  Final  Culture, blood (routine x 2)     Status: None   Collection Time: 03/18/18 12:45 PM  Result Value Ref Range Status   Specimen Description BLOOD LEFT HAND  Final   Special Requests   Final    BOTTLES DRAWN AEROBIC AND ANAEROBIC Blood Culture results may not be optimal due to an inadequate volume of blood received in culture bottles   Culture   Final    NO GROWTH 5 DAYS Performed at [facility], [address], [place], [place] [address]    Report Status 03/23/2018 FINAL  Final  C difficile quick scan w PCR reflex     Status: None   Collection Time: 03/19/18  5:04 PM  Result Value Ref Range Status   C Diff antigen NEGATIVE NEGATIVE Final   C Diff toxin NEGATIVE NEGATIVE Final   C Diff interpretation No C. difficile detected.  Final    Comment:  VALID Performed at [facility], [address], [place],  [address]   Gastrointestinal Panel by PCR , Stool     Status: None   Collection Time: 03/19/18  5:04 PM  Result Value Ref Range Status   Campylobacter species NOT DETECTED NOT DETECTED Final   Plesimonas shigelloides NOT DETECTED NOT DETECTED Final   Salmonella species NOT DETECTED NOT DETECTED Final   Yersinia enterocolitica NOT DETECTED NOT DETECTED Final   Vibrio species NOT DETECTED NOT DETECTED Final   Vibrio cholerae NOT DETECTED NOT DETECTED Final   Enteroaggregative E coli (EAEC) NOT DETECTED NOT DETECTED Final   Enteropathogenic E coli (EPEC) NOT DETECTED NOT DETECTED Final   Enterotoxigenic E coli (ETEC) NOT DETECTED NOT DETECTED Final   Shiga like toxin producing E coli (STEC) NOT DETECTED NOT DETECTED Final   Shigella/Enteroinvasive E coli (EIEC) NOT DETECTED NOT DETECTED Final   Cryptosporidium NOT DETECTED NOT DETECTED Final   Cyclospora cayetanensis NOT DETECTED NOT DETECTED Final   Entamoeba histolytica NOT DETECTED NOT DETECTED Final   Giardia lamblia NOT DETECTED NOT DETECTED  Final   Adenovirus F40/41 NOT DETECTED NOT DETECTED Final   Astrovirus NOT DETECTED NOT DETECTED Final   Norovirus GI/GII NOT DETECTED NOT DETECTED Final   Rotavirus A NOT DETECTED NOT DETECTED Final   Sapovirus (I, II, IV, and V) NOT DETECTED NOT DETECTED Final    Comment: Performed at [facility], [address] [place], [place] [address]  Surgical pcr screen     Status: None   Collection Time: 03/27/18 11:04 PM  Result Value Ref Range Status   MRSA, PCR NEGATIVE NEGATIVE Final   Staphylococcus aureus NEGATIVE NEGATIVE Final    Comment: (NOTE) The Xpert SA Assay (FDA approved for NASAL specimens in patients 15 years of age and older), is one component of a comprehensive surveillance program. It is not intended to diagnose infection nor to guide or monitor treatment. Performed at [facility], [place]  [address], [place], [place] [address]   Aerobic/Anaerobic Culture (surgical/deep wound)     Status: None (Preliminary result)   Collection Time: 03/28/18  9:51 AM  Result Value Ref Range Status   Specimen Description WOUND  Final   Special Requests DISCS T10 T11  Final   Gram Stain   Final    RARE WBC PRESENT,BOTH PMN AND MONONUCLEAR NO ORGANISMS SEEN    Culture   Final    NO GROWTH 2 DAYS Performed at [facility], [place] [address], [place], [place] [address]    Report Status PENDING  Incomplete    Studies/Results: Dg Thoracolumabar Spine  Result Date: 03/28/2018 CLINICAL DATA:  T8 through L2 fixation. FLUOROSCOPY TIME:  1 minutes 8 seconds. EXAM: THORACOLUMBAR SPINE 1V COMPARISON:  CT scan of March 20, 2018. FINDINGS: Status post surgical posterior fusion extending from T8 to L2 with bilateral intrapedicular screw placement. Good alignment of vertebral bodies is noted. IMPRESSION: Status post surgical posterior fusion of thoracolumbar junction as described above. Electronically Signed   By: Marijo Conception, M.D.   On: 03/28/2018 13:32   Dg C-arm 1-60 Min  Result Date: 03/28/2018 CLINICAL DATA:  T8 through L2 fixation. FLUOROSCOPY TIME:  1 minutes 8 seconds. EXAM: THORACOLUMBAR SPINE 1V COMPARISON:  CT scan of March 20, 2018. FINDINGS: Status post surgical posterior fusion extending from T8 to L2 with bilateral intrapedicular screw placement. Good alignment of vertebral bodies is noted. IMPRESSION: Status post surgical posterior fusion of thoracolumbar junction as described above. Electronically Signed   By: Marijo Conception, M.D.   On: 03/28/2018 13:32   Dg C-arm 1-60 Min  Result Date: 03/28/2018 CLINICAL DATA:  T8 through L2 fixation. FLUOROSCOPY TIME:  1 minutes 8 seconds. EXAM: THORACOLUMBAR SPINE 1V COMPARISON:  CT scan of March 20, 2018. FINDINGS: Status post surgical posterior fusion extending from T8 to L2 with bilateral intrapedicular screw placement. Good alignment of vertebral bodies  is noted. IMPRESSION: Status post surgical posterior fusion of thoracolumbar junction as described above. Electronically Signed   By: Marijo Conception, M.D.   On: 03/28/2018 13:32      Assessment/Plan:  INTERVAL HISTORY: I am adding rifampin   Principal Problem:   Bacteremia due to methicillin susceptible Staphylococcus aureus (MSSA) Active Problems:   HYPERCHOLESTEROLEMIA   Essential hypertension, benign   CAD, NATIVE VESSEL   GERD   Chronic back pain   History of cerebrovascular disease   Weakness   Fever   Sepsis ([place])   Nephrolithiasis   AKI (acute kidney injury) ([place])   Acute metabolic encephalopathy  Cognitive impairment   Chronic pain syndrome   Obesity   Staphylococcus aureus sepsis ([facility])   Encephalopathy   Weakness of both lower extremities   Discitis of thoracic region   Spinal cord stimulator status   Abdominal distension   Epidural abscess    Cody Velez is a 72 y.o. male with with history of cervical and lumbar surgery, cervical implanted spinal stimulator he was admitted to any pen with methicillin sensitive Staphylococcus aureus bacteremia then found to have progressive lower extremity weakness.  Imaging was compromised by the fact that he could not get MRIs and there was debate back and forth about whether or not he could.  Ultimately he was transferred to [facility] and fortunately Dr. Ellene Route was able to take him to the operating room yesterday where he performed a T10-T11 laminectomy with decompression of an extensive epidural abscess with debridement of disc space at T10-T11 asked segmental fixation from T8 L3 with methyl correlate screw augmentation in T8 and T9.  I will plan on giving him 8 weeks of cefazolin and I am adding rifampin 363m po BID   Given hardware that had to be place we will want to give him more protracted oral antibiotics after he finishes his IV abx  Tentative plan for antibiotics is:  Diagnosis: Methicillin sensitive staph  aureus bacteremia with sepsis and extensive epidural abscess discitis and thoracic spine  Culture Result: MSSA  No Known Allergies  OPAT Orders  Discharge antibiotics: Per pharmacy protocol IV cefazolin 2 g q. 8 hours x 8 weeks +  oral rifampin 300 mg BID  Duration: 8 weeks End Date: June 3rd  [facility]:  Labs weekly while on IV antibiotics: _x_ CBC with differential  x__ CMP _x_ CRP _x_ ESR   _x_ Please pull PIC at completion of IV antibiotics __ Please leave PIC in place until doctor has seen patient or been notified  Fax weekly labs to [phone] Clinic Follow Up Appt: Next  5-6 weeks since he will go to Rehab.  .I spent greater than 35 minutes with the patient including greater than 50% of time in face to face counsel of the patient the nature of discitis osteomyelitis epidural abscess and the comp gating features of hardware with plan of action and also with further discussions with his daughter via telephone and in coordination of his care.    LOS: 14 days   CAlcide Evener4/10/2018, 11:37 AM

## 2018-03-31 ENCOUNTER — Inpatient Hospital Stay (HOSPITAL_COMMUNITY): Payer: Federal, State, Local not specified - PPO | Admitting: Occupational Therapy

## 2018-03-31 ENCOUNTER — Inpatient Hospital Stay (HOSPITAL_COMMUNITY): Payer: Federal, State, Local not specified - PPO | Admitting: Physical Therapy

## 2018-03-31 ENCOUNTER — Encounter (HOSPITAL_COMMUNITY): Payer: Self-pay | Admitting: Neurological Surgery

## 2018-03-31 ENCOUNTER — Ambulatory Visit (HOSPITAL_COMMUNITY): Payer: Medicare Other | Attending: Physician Assistant

## 2018-03-31 DIAGNOSIS — I82493 Acute embolism and thrombosis of other specified deep vein of lower extremity, bilateral: Secondary | ICD-10-CM | POA: Insufficient documentation

## 2018-03-31 DIAGNOSIS — M7989 Other specified soft tissue disorders: Secondary | ICD-10-CM | POA: Insufficient documentation

## 2018-03-31 DIAGNOSIS — R609 Edema, unspecified: Secondary | ICD-10-CM

## 2018-03-31 LAB — CBC WITH DIFFERENTIAL/PLATELET
Basophils Absolute: 0 10*3/uL (ref 0.0–0.1)
Basophils Relative: 0 %
Eosinophils Absolute: 0.3 10*3/uL (ref 0.0–0.7)
Eosinophils Relative: 4 %
HCT: 25.6 % — ABNORMAL LOW (ref 39.0–52.0)
Hemoglobin: 7.8 g/dL — ABNORMAL LOW (ref 13.0–17.0)
Lymphocytes Relative: 16 %
Lymphs Abs: 1.3 10*3/uL (ref 0.7–4.0)
MCH: 26.5 pg (ref 26.0–34.0)
MCHC: 30.5 g/dL (ref 30.0–36.0)
MCV: 87.1 fL (ref 78.0–100.0)
Monocytes Absolute: 0.5 10*3/uL (ref 0.1–1.0)
Monocytes Relative: 6 %
Neutro Abs: 5.8 10*3/uL (ref 1.7–7.7)
Neutrophils Relative %: 74 %
Platelets: 529 10*3/uL — ABNORMAL HIGH (ref 150–400)
RBC: 2.94 MIL/uL — ABNORMAL LOW (ref 4.22–5.81)
RDW: 17.1 % — ABNORMAL HIGH (ref 11.5–15.5)
WBC: 7.9 10*3/uL (ref 4.0–10.5)

## 2018-03-31 LAB — COMPREHENSIVE METABOLIC PANEL
ALT: 23 U/L (ref 17–63)
AST: 42 U/L — ABNORMAL HIGH (ref 15–41)
Albumin: 2 g/dL — ABNORMAL LOW (ref 3.5–5.0)
Alkaline Phosphatase: 81 U/L (ref 38–126)
Anion gap: 8 (ref 5–15)
BUN: 11 mg/dL (ref 6–20)
CO2: 28 mmol/L (ref 22–32)
Calcium: 8.6 mg/dL — ABNORMAL LOW (ref 8.9–10.3)
Chloride: 99 mmol/L — ABNORMAL LOW (ref 101–111)
Creatinine, Ser: 0.8 mg/dL (ref 0.61–1.24)
GFR calc Af Amer: >60 mL/min (ref >60)
GFR calc non Af Amer: >60 mL/min (ref >60)
Glucose, Bld: 111 mg/dL — ABNORMAL HIGH (ref 65–99)
Potassium: 4 mmol/L (ref 3.5–5.1)
Sodium: 135 mmol/L (ref 135–145)
Total Bilirubin: 0.4 mg/dL (ref 0.3–1.2)
Total Protein: 6.6 g/dL (ref 6.5–8.1)

## 2018-03-31 LAB — TYPE AND SCREEN
ABO/RH(D): O NEG
Antibody Screen: NEGATIVE
Unit division: 0
Unit division: 0

## 2018-03-31 LAB — BPAM RBC
Blood Product Expiration Date: 201904252359
Blood Product Expiration Date: 201904262359
Unit Type and Rh: 9500
Unit Type and Rh: 9500

## 2018-03-31 MED ORDER — ENOXAPARIN SODIUM 40 MG/0.4ML ~~LOC~~ SOLN
40.0000 mg | SUBCUTANEOUS | Status: DC
  Administered 2018-03-31 – 2018-04-26 (×27): 40 mg via SUBCUTANEOUS
  Filled 2018-03-31 (×27): qty 0.4

## 2018-03-31 MED ORDER — SODIUM CHLORIDE 0.9% FLUSH
10.0000 mL | INTRAVENOUS | Status: DC | PRN
  Administered 2018-04-01 – 2018-04-15 (×9): 10 mL
  Administered 2018-04-18: 20 mL
  Administered 2018-04-19 – 2018-05-05 (×9): 10 mL
  Filled 2018-03-31 (×19): qty 40

## 2018-03-31 NOTE — Progress Notes (Signed)
Physical Therapy Note  Patient Details  Name: Cody Velez MRN: [id number] Date of Birth: 12/14/1946 Today's Date: 03/31/2018    Attempted to see pt for scheduled therapy session at 1415.  Pt not in room, per nursing he was in radiology and was (+) for BLE DVTs.  Per nursing pt not to participate in therapy for the rest of the day.  Missed 75 minutes skilled PT.     Michel Santee 03/31/2018, 3:07 PM

## 2018-03-31 NOTE — Progress Notes (Signed)
Patient information reviewed and entered into eRehab system by Hussien Greenblatt, RN, CRRN, PPS Coordinator.  Information including medical coding and functional independence measure will be reviewed and updated through discharge.     Per nursing patient was given "Data Collection Information Summary for Patients in Inpatient Rehabilitation Facilities with attached "Privacy Act Statement-Health Care Records" upon admission.  

## 2018-03-31 NOTE — Progress Notes (Signed)
Pt arrived to unit via bed. Pt is alert and oriented x 4, able to make needs know, reports pain but pain medication was administered by acute, repositioned with good results.  Lungs clear, incision to back with staples, minimal drainage noted, stage II noted to buttocks with foam dressing applied. Pt has cell and personal CPAP at bedside. Call light in reach, bed in low position with bed alarm on. Will continue plan of care.

## 2018-03-31 NOTE — Progress Notes (Signed)
Consulted Dr. Pollyann Samples cleared patient to start Lovenox. Discussed results/plan with patient and daughters. Will repeat dopplers next Wednesday to monitor for stability.

## 2018-03-31 NOTE — Progress Notes (Signed)
Social Work  Social Work Assessment and Plan  Patient Details  Name: Cody Velez MRN: [id number] Date of Birth: 1946/10/20  Today's Date: 03/31/2018  Problem List:  Patient Active Problem List   Diagnosis Date Noted  . Postoperative pain   . DVT, lower extremity, distal, acute, bilateral ([place])   . Acute blood loss anemia   . Anemia of chronic disease   . Essential hypertension   . Bacteremia   . Myelopathy ([place]) 03/30/2018  . Paraplegia ([place])   . Postlaminectomy syndrome, lumbar region   . Epidural abscess   . Abdominal distension   . Encephalopathy   . Weakness of both lower extremities   . Discitis of thoracic region   . Spinal cord stimulator status   . Staphylococcus aureus sepsis ([place]) 03/20/2018  . Bacteremia due to methicillin susceptible Staphylococcus aureus (MSSA) 03/18/2018  . Obesity 03/18/2018  . Weakness 03/16/2018  . Fever 03/16/2018  . Sepsis ([place]) 03/16/2018  . Nephrolithiasis 03/16/2018  . AKI (acute kidney injury) ([place]) 03/16/2018  . Acute metabolic encephalopathy 41/28/7867  . Cognitive impairment 03/16/2018  . Chronic pain syndrome 03/16/2018  . B12 deficiency 01/28/2016  . Memory difficulty 07/23/2015  . History of cerebrovascular disease 07/23/2015  . FH: colon cancer 08/13/2014  . Hx of adenomatous colonic polyps 08/13/2014  . Chronic back pain 05/30/2012  . CAD, NATIVE VESSEL 07/17/2010  . HYPERCHOLESTEROLEMIA 10/31/2009  . SMOKELESS TOBACCO ABUSE 10/31/2009  . Obstructive sleep apnea 10/31/2009  . Essential hypertension, benign 10/31/2009  . GERD 10/31/2009   Past Medical History:  Past Medical History:  Diagnosis Date  . Allergic rhinitis   . Arthritis   . Asthma    as a child  . B12 deficiency   . Cancer [facility])    prostate  . Cervicogenic headache 01/28/2016  . Chronic back pain   . Coronary atherosclerosis of native coronary artery    Mild nonobstructive CAD at catheterization January 2015  . Depression   . Essential  hypertension, benign   . Falls   . GERD (gastroesophageal reflux disease)   . History of cerebrovascular disease 07/23/2015  . History of pneumonia 02/2011  . Hyperlipidemia   . Kidney stone   . Memory difficulty 07/23/2015  . OSA (obstructive sleep apnea)    CPAP - Dr. [name]  . Prostate cancer ([place])   . PTSD (post-traumatic stress disorder)    [place]  . Rectal bleeding    Past Surgical History:  Past Surgical History:  Procedure Laterality Date  . APPLICATION OF ROBOTIC ASSISTANCE FOR SPINAL PROCEDURE N/A 03/28/2018   Procedure: APPLICATION OF ROBOTIC ASSISTANCE FOR SPINAL PROCEDURE;  Surgeon: Kristeen Miss, MD;  Location: [facility];  Service: Neurosurgery;  Laterality: N/A;  . BACK SURGERY  02/14/12   lumbar OR #7; "today redid L1L2; replaced screws; added bone from hip"  . BILATERAL KNEE ARTHROSCOPY    . COLONOSCOPY  10/15/2008   Dr. [name]: tubular adenoma   . COLONOSCOPY  12/17/2003   EHM:CNOBSJ rectal and colon  . COLONOSCOPY N/A 09/05/2014   Procedure: COLONOSCOPY;  Surgeon: Daneil Dolin, MD;  [facility];  Service: Endoscopy;  Laterality: N/A;  7:30-rescheduled 9/17 to [place] notified pt  . CYSTOSCOPY WITH STENT PLACEMENT Right 01/27/2016   Procedure: CYSTOSCOPY WITH STENT PLACEMENT;  Surgeon: [name], MD;  Location: [facility];  Service: Urology;  Laterality: Right;  . CYSTOSCOPY/RETROGRADE/URETEROSCOPY/STONE EXTRACTION WITH BASKET Right 01/27/2016   Procedure: CYSTOSCOPY, RIGHT RETROGRADE, RIGHT URETEROSCOPY, STONE EXTRACTION ;  Surgeon: [name], MD;  Location: [facility];  Service: Urology;  Laterality: Right;  . ESOPHAGOGASTRODUODENOSCOPY  10/15/2008     Dr Rourk:Schatzki's ring status post dilation and disruption via 76 F Maloney dilator/ otherwise unremarkable esophagus, small hiatal hernia, multiple fundal gland polyps not manipulated, gastritis, negative H.pylori  . ESOPHAGOGASTRODUODENOSCOPY  06/21/02   TDD:UKGUR sliding hiatal hernia with mild  changes of reflux esophagitis limited to gastroesophageal junction.  Noncritical ring at distal esophagus, 3 cm proximal to gastroesophageal junction/Antral gastritis  . [place]   "broke face playing softball"  . FRACTURE SURGERY     "left wrist; broke it; took spur off"  . HOLMIUM LASER APPLICATION Right 03/21/7061   Procedure: HOLMIUM LASER APPLICATION;  Surgeon: [name], MD;  Location: [facility];  Service: Urology;  Laterality: Right;  . LEFT HEART CATHETERIZATION WITH CORONARY ANGIOGRAM N/A 12/27/2013   Procedure: LEFT HEART CATHETERIZATION WITH CORONARY ANGIOGRAM;  Surgeon: [name], MD;  Location[facility] CATH LAB;  Service: Cardiovascular;  Laterality: N/A;  . neck epidural    . POSTERIOR LUMBAR FUSION 4 LEVEL N/A 03/28/2018   Procedure: Thoracic eight -Lumbar two- FIXATION WITH SCREW PLACEMENT, DECOMPRESSION Thoracic ten-Thoracic eleven  FOR OSTEOMYELITIS;  Surgeon: Kristeen Miss, MD;  Location: [place];  Service: Neurosurgery;  Laterality: N/A;  . SHOULDER SURGERY Bilateral   . TEE WITHOUT CARDIOVERSION N/A 03/21/2018   Procedure: TRANSESOPHAGEAL ECHOCARDIOGRAM (TEE) WITH PROPOFOL;  Surgeon: [name], MD;  [facility];  Service: Cardiovascular;  Laterality: N/A;   Social History:  reports that he quit smoking about 59 years ago. His smoking use included cigarettes. His smokeless tobacco use includes chew. He reports that he drinks alcohol. He reports that he does not use drugs.  Family / Support Systems Marital Status: Widow/Widower How Long?: x 5 yrs but together with girlfriend x 4 yrs Patient Roles: Partner, Parent Spouse/Significant Other: girlfriend, Monika Salk @ [phone] Children: daughters:  Knute Neu [facility]) @ (C) [phone];  Cher Nakai [facility]) @ (C) [phone];  Tawni Carnes [facility]) @ ([phone] Anticipated Caregiver: Girlfriend and daughters. Ability/Limitations of Caregiver: Girlfriend does not  work and is retired.  Daughters work.   Caregiver Availability: 24/7 Family Dynamics: Pt and daughter report good relationship with all family and girlfriend.  Daughters very willing to provide assistance as much as they are able.  No concerns.  Social History Preferred language: English Religion: [employer] Cultural Background: NA Read: Yes Write: Yes Employment Status: Retired Freight forwarder Issues: None Guardian/Conservator: None - per MD, pt is ?capable of making decisions on his own behalf.   Abuse/Neglect Abuse/Neglect Assessment Can Be Completed: Yes Physical Abuse: Denies Verbal Abuse: Denies Sexual Abuse: Denies Exploitation of patient/patient's resources: Denies Self-Neglect: Denies  Emotional Status Pt's affect, behavior adn adjustment status: Pt very pleasant and describes himself as "motivated"  for CIR program. Hopeful he will reach goals and be able to d/c sooner than ELOS.  Pt able to complete assessment interview without difficulty.  He does talk easily about the acute onset of loss of function in legs as he and girlfriend were en route home from [facility].  States, "before this happened I was doing great! no limitations!"  He denies any significant emotional distress, however, have referred for neuropsychology for additional support. Recent Psychosocial Issues: None Pyschiatric History: PTSD r/t [place] service Substance Abuse History: None  Patient / Family Perceptions, Expectations & Goals Pt/Family understanding of illness & functional limitations:  Pt and daughter with good, general understanding of the osteomyelitis and abscess which led to need for further surgery.  Good understanding of current functional limitations/ need for CIR.  Also, aware that expectation of need for IV abx throught the beginning of June. Premorbid pt/family roles/activities: Pt was completely independent PTA and active at home and in community. Anticipated changes in  roles/activities/participation: Per goals of min assist w/c, girlfriend will provide primary caregiver support. Pt/family expectations/goals: "I just hope I can eventually get back up on my feet."  [employer]: None Premorbid Home Care/DME Agencies: None Transportation available at discharge: yes Resource referrals recommended: Neuropsychology  Discharge Planning Living Arrangements: Spouse/significant other(girlfriend) Support Systems: Spouse/significant other, Children, Other relatives, Friends/neighbors Type of Residence: Private residence Insurance Resources: [employer], Multimedia programmer (specify)([employer]) Financial Resources: Radio broadcast assistant Screen Referred: No Living Expenses: Own Money Management: Patient Does the patient have any problems obtaining your medications?: No Home Management: pt and girlfriend Patient/Family Preliminary Plans: Pt plans to d/c to his home.  Daughter confirms that they are building a ramp. Social Work Anticipated Follow Up Needs: HH/OP Expected length of stay: 21-28 days  Clinical Impression Pleasant gentleman here following surgery for thoracic epidural abscess and osteomyelitis.  Very motivated for CIR therapy and hopeful he will be ready for d/c earlier than anticipated ELOS of 3-4 weeks.  Denies any significant emotional distress but will consult neuropsychology due to acute loss of independence.  Good family support.  SW to follow for support and d/c planning needs.  Angelyse Heslin 03/31/2018, 4:30 PM

## 2018-03-31 NOTE — Progress Notes (Signed)
LE venous duplex prelim: DVT noted in the Right peroneal veins and the Left soleal veins. Landry Mellow, RDMS, RVT  called results to [facility].

## 2018-03-31 NOTE — Progress Notes (Signed)
Subjective/Complaints:  No issues overnite  Pt denies back pain  ROS- neg CP, SOB,   Objective: Vital Signs: Blood pressure 130/70, pulse 80, temperature 98.2 F (36.8 C), resp. rate 18, height '6\' 3"'  (1.905 m), weight 132.9 kg (293 lb), SpO2 90 %. No results found. Results for orders placed or performed during the hospital encounter of 03/15/18 (from the past 72 hour(s))  I-STAT 7, (LYTES, BLD GAS, ICA, H+H)     Status: Abnormal   Collection Time: 03/28/18  9:48 AM  Result Value Ref Range   pH, Arterial 7.457 (H) 7.350 - 7.450   pCO2 arterial 34.9 32.0 - 48.0 mmHg   pO2, Arterial 138.0 (H) 83.0 - 108.0 mmHg   Bicarbonate 24.6 20.0 - 28.0 mmol/L   TCO2 26 22 - 32 mmol/L   O2 Saturation 99.0 %   Acid-Base Excess 1.0 0.0 - 2.0 mmol/L   Sodium 135 135 - 145 mmol/L   Potassium 4.9 3.5 - 5.1 mmol/L   Calcium, Ion 1.21 1.15 - 1.40 mmol/L   HCT 36.0 (L) 39.0 - 52.0 %   Hemoglobin 12.2 (L) 13.0 - 17.0 g/dL   Patient temperature 37.0 C    Sample type ARTERIAL   Aerobic/Anaerobic Culture (surgical/deep wound)     Status: None (Preliminary result)   Collection Time: 03/28/18  9:51 AM  Result Value Ref Range   Specimen Description WOUND    Special Requests DISCS T10 T11    Gram Stain      RARE WBC PRESENT,BOTH PMN AND MONONUCLEAR NO ORGANISMS SEEN    Culture      NO GROWTH 2 DAYS Performed at [facility], [address], [place], [place] [address]    Report Status PENDING   I-STAT 7, (LYTES, BLD GAS, ICA, H+H)     Status: Abnormal   Collection Time: 03/28/18  1:20 PM  Result Value Ref Range   pH, Arterial 7.394 7.350 - 7.450   pCO2 arterial 42.1 32.0 - 48.0 mmHg   pO2, Arterial 166.0 (H) 83.0 - 108.0 mmHg   Bicarbonate 25.6 20.0 - 28.0 mmol/L   TCO2 27 22 - 32 mmol/L   O2 Saturation 99.0 %   Acid-Base Excess 1.0 0.0 - 2.0 mmol/L   Sodium 135 135 - 145 mmol/L   Potassium 5.9 (H) 3.5 - 5.1 mmol/L   Calcium, Ion 1.22 1.15 - 1.40 mmol/L   HCT 32.0 (L) 39.0 - 52.0 %    Hemoglobin 10.9 (L) 13.0 - 17.0 g/dL   Patient temperature 37.2 C    Sample type ARTERIAL   Comprehensive metabolic panel     Status: Abnormal   Collection Time: 03/28/18  5:23 PM  Result Value Ref Range   Sodium 133 (L) 135 - 145 mmol/L   Potassium 5.7 (H) 3.5 - 5.1 mmol/L   Chloride 100 (L) 101 - 111 mmol/L   CO2 24 22 - 32 mmol/L   Glucose, Bld 138 (H) 65 - 99 mg/dL   BUN 19 6 - 20 mg/dL   Creatinine, Ser 1.09 0.61 - 1.24 mg/dL   Calcium 8.9 8.9 - 10.3 mg/dL   Total Protein 7.4 6.5 - 8.1 g/dL   Albumin 2.6 (L) 3.5 - 5.0 g/dL   AST 44 (H) 15 - 41 U/L   ALT 20 17 - 63 U/L   Alkaline Phosphatase 87 38 - 126 U/L   Total Bilirubin 0.5 0.3 - 1.2 mg/dL   GFR calc non Af Amer >60 >60 mL/min  GFR calc Af Amer >60 >60 mL/min    Comment: (NOTE) The eGFR has been calculated using the CKD EPI equation. This calculation has not been validated in all clinical situations. eGFR's persistently <60 mL/min signify possible Chronic Kidney Disease.    Anion gap 9 5 - 15    Comment: Performed at [facility] [address] [place], [place] [address]  CBC     Status: Abnormal   Collection Time: 03/28/18  5:23 PM  Result Value Ref Range   WBC 15.9 (H) 4.0 - 10.5 K/uL   RBC 3.64 (L) 4.22 - 5.81 MIL/uL   Hemoglobin 9.7 (L) 13.0 - 17.0 g/dL   HCT 31.2 (L) 39.0 - 52.0 %   MCV 85.7 78.0 - 100.0 fL   MCH 26.6 26.0 - 34.0 pg   MCHC 31.1 30.0 - 36.0 g/dL   RDW 17.1 (H) 11.5 - 15.5 %   Platelets 722 (H) 150 - 400 K/uL    Comment: Performed at [facility] [address] [place], [place] [address]  CBC     Status: Abnormal   Collection Time: 03/29/18  4:39 AM  Result Value Ref Range   WBC 12.8 (H) 4.0 - 10.5 K/uL   RBC 3.45 (L) 4.22 - 5.81 MIL/uL   Hemoglobin 8.9 (L) 13.0 - 17.0 g/dL   HCT 29.6 (L) 39.0 - 52.0 %   MCV 85.8 78.0 - 100.0 fL   MCH 25.8 (L) 26.0 - 34.0 pg   MCHC 30.1 30.0 - 36.0 g/dL   RDW 16.7 (H) 11.5 - 15.5 %   Platelets 619 (H) 150 - 400 K/uL    Comment:  Performed at [facility], [place] [address], [place], [place] [address]  Comprehensive metabolic panel     Status: Abnormal   Collection Time: 03/29/18  4:39 AM  Result Value Ref Range   Sodium 131 (L) 135 - 145 mmol/L   Potassium 4.7 3.5 - 5.1 mmol/L   Chloride 97 (L) 101 - 111 mmol/L   CO2 25 22 - 32 mmol/L   Glucose, Bld 117 (H) 65 - 99 mg/dL   BUN 19 6 - 20 mg/dL   Creatinine, Ser 1.03 0.61 - 1.24 mg/dL   Calcium 8.7 (L) 8.9 - 10.3 mg/dL   Total Protein 6.7 6.5 - 8.1 g/dL   Albumin 2.3 (L) 3.5 - 5.0 g/dL   AST 43 (H) 15 - 41 U/L   ALT 17 17 - 63 U/L   Alkaline Phosphatase 83 38 - 126 U/L   Total Bilirubin 0.4 0.3 - 1.2 mg/dL   GFR calc non Af Amer >60 >60 mL/min   GFR calc Af Amer >60 >60 mL/min    Comment: (NOTE) The eGFR has been calculated using the CKD EPI equation. This calculation has not been validated in all clinical situations. eGFR's persistently <60 mL/min signify possible Chronic Kidney Disease.    Anion gap 9 5 - 15    Comment: Performed at [facility] [address] [place], [place] [address]     HEENT: normal Cardio: RRR and no murmur Resp: CTA B/L and unlabored GI: BS positive and non tender Extremity:  Edema bilateral ankle Skin:   Intact Neuro: Alert/Oriented, Abnormal Sensory reduced LT and Proprio RLE and Abnormal Motor 5/5 in BUE, 3- RLE, 2- LLE Musc/Skel:  Other no pain with UE or LE ROM Gen NAD   Assessment/Plan: 1. Functional deficits secondary to paraplegia secondary to epidural  abscess which require 3+ hours per day of interdisciplinary therapy in a comprehensive inpatient rehab setting. Physiatrist is providing close team supervision and 24 hour management of active medical problems listed below. Physiatrist and rehab team continue to assess barriers to discharge/monitor patient progress toward functional and medical goals. FIM:                   Function - Comprehension Comprehension: Auditory Comprehension  assist level: Follows basic conversation/direction with no assist     Function - Social Interaction Social Interaction assist level: Interacts appropriately with others - No medications needed.        Medical Problem List and Plan: 1.Decreased functional mobilitysecondary to T10-11 osteomyelitis/epidural abscess with subsequent myelopathy/paraplegia, status post decompression and fusion T8-L3. Back brace when out of bed. Patient with history of multiple lumbar surgeries as well as spinal cord stimulator January 2019 2. DVT Prophylaxis/Anticoagulation: SCDs. Check vascular study 3. Pain Management:Neurontin 600 mg 3 times daily, Robaxin and oxycodone as needed 4. Mood:Aricept 10 mg nightly, Namenda 10 mg twice daily, Effexor sore 150 mg twice daily, trazodone 100 mg nightly 5. Neuropsych: This patientiscapable of making decisions on hisown behalf. 6. Skin/Wound Care:Routine skin checks 7. Fluids/Electrolytes/Nutrition:Routine INO's with follow-up chemistries 8.ID/MSSA bacteremia.Intravenous Ancef 2 g every 8 hours through 05/22/2018 and stop as well as rifampin 300 mg twice daily. Follow-up per infectious disease 9.Acute on chronic anemia. Latest hemoglobin 8.9. Follow-up chemistries 10.Hypertension. Norvasc 10 mg daily 11.Hyperlipidemia. Pravachol 12.History of prostate cancer. Proscar 5 mg daily 13.Constipation. Laxative assistance    LOS (Days) 1 A FACE TO FACE EVALUATION WAS PERFORMED  Charlett Blake 03/31/2018, 8:40 AM

## 2018-03-31 NOTE — Progress Notes (Signed)
Physical Therapy Assessment and Plan  Patient Details  Name: Cody Velez MRN: [id number] Date of Birth: October 30, 1946  PT Diagnosis: Impaired sensation, Muscle weakness and Paraplegia Rehab Potential: Good ELOS: 21-28 days   Today's Date: 03/31/2018 PT Individual Time: 5462-7035 PT Individual Time Calculation (min): 60 min    Problem List:  Patient Active Problem List   Diagnosis Date Noted  . Myelopathy ([place]) 03/30/2018  . Paraplegia ([place])   . Postlaminectomy syndrome, lumbar region   . Epidural abscess   . Abdominal distension   . Encephalopathy   . Weakness of both lower extremities   . Discitis of thoracic region   . Spinal cord stimulator status   . Staphylococcus aureus sepsis ([place]) 03/20/2018  . Bacteremia due to methicillin susceptible Staphylococcus aureus (MSSA) 03/18/2018  . Obesity 03/18/2018  . Weakness 03/16/2018  . Fever 03/16/2018  . Sepsis ([place]) 03/16/2018  . Nephrolithiasis 03/16/2018  . AKI (acute kidney injury) ([place]) 03/16/2018  . Acute metabolic encephalopathy 00/93/8182  . Cognitive impairment 03/16/2018  . Chronic pain syndrome 03/16/2018  . B12 deficiency 01/28/2016  . Memory difficulty 07/23/2015  . History of cerebrovascular disease 07/23/2015  . FH: colon cancer 08/13/2014  . Hx of adenomatous colonic polyps 08/13/2014  . Chronic back pain 05/30/2012  . CAD, NATIVE VESSEL 07/17/2010  . HYPERCHOLESTEROLEMIA 10/31/2009  . SMOKELESS TOBACCO ABUSE 10/31/2009  . Obstructive sleep apnea 10/31/2009  . Essential hypertension, benign 10/31/2009  . GERD 10/31/2009    Past Medical History:  Past Medical History:  Diagnosis Date  . Allergic rhinitis   . Arthritis   . Asthma    as a child  . B12 deficiency   . Cancer [facility])    prostate  . Cervicogenic headache 01/28/2016  . Chronic back pain   . Coronary atherosclerosis of native coronary artery    Mild nonobstructive CAD at catheterization January 2015  . Depression   . Essential  hypertension, benign   . Falls   . GERD (gastroesophageal reflux disease)   . History of cerebrovascular disease 07/23/2015  . History of pneumonia 02/2011  . Hyperlipidemia   . Kidney stone   . Memory difficulty 07/23/2015  . OSA (obstructive sleep apnea)    CPAP - Dr. [name]  . Prostate cancer ([place])   . PTSD (post-traumatic stress disorder)    [place]  . Rectal bleeding    Past Surgical History:  Past Surgical History:  Procedure Laterality Date  . BACK SURGERY  02/14/12   lumbar OR #7; "today redid L1L2; replaced screws; added bone from hip"  . BILATERAL KNEE ARTHROSCOPY    . COLONOSCOPY  10/15/2008   Dr. [name]: tubular adenoma   . COLONOSCOPY  12/17/2003   XHB:ZJIRCV rectal and colon  . COLONOSCOPY N/A 09/05/2014   Procedure: COLONOSCOPY;  Surgeon: Daneil Dolin, MD;  Location: [facility] ENDO [facility];  Service: Endoscopy;  Laterality: N/A;  7:30-rescheduled 9/17 to [place] notified pt  . CYSTOSCOPY WITH STENT PLACEMENT Right 01/27/2016   Procedure: CYSTOSCOPY WITH STENT PLACEMENT;  Surgeon: [name], MD;  Location: [facility] ORS;  Service: Urology;  Laterality: Right;  . CYSTOSCOPY/RETROGRADE/URETEROSCOPY/STONE EXTRACTION WITH BASKET Right 01/27/2016   Procedure: CYSTOSCOPY, RIGHT RETROGRADE, RIGHT URETEROSCOPY, STONE EXTRACTION ;  Surgeon: [name], MD;  Location: [facility] ORS;  Service: Urology;  Laterality: Right;  . ESOPHAGOGASTRODUODENOSCOPY  10/15/2008     Dr Rourk:Schatzki's ring status post dilation and disruption via [address] dilator/ otherwise unremarkable esophagus, small hiatal hernia, multiple fundal gland  polyps not manipulated, gastritis, negative H.pylori  . ESOPHAGOGASTRODUODENOSCOPY  06/21/02   PTW:SFKCL sliding hiatal hernia with mild changes of reflux esophagitis limited to gastroesophageal junction.  Noncritical ring at distal esophagus, 3 cm proximal to gastroesophageal junction/Antral gastritis  . [place]   "broke face playing  softball"  . FRACTURE SURGERY     "left wrist; broke it; took spur off"  . HOLMIUM LASER APPLICATION Right 01/26/5169   Procedure: HOLMIUM LASER APPLICATION;  Surgeon: [name], MD;  Location: [facility] ORS;  Service: Urology;  Laterality: Right;  . LEFT HEART CATHETERIZATION WITH CORONARY ANGIOGRAM N/A 12/27/2013   Procedure: LEFT HEART CATHETERIZATION WITH CORONARY ANGIOGRAM;  Surgeon: [name], MD;  Location: [facility] CATH LAB;  Service: Cardiovascular;  Laterality: N/A;  . neck epidural    . SHOULDER SURGERY Bilateral   . TEE WITHOUT CARDIOVERSION N/A 03/21/2018   Procedure: TRANSESOPHAGEAL ECHOCARDIOGRAM (TEE) WITH PROPOFOL;  Surgeon: [name], MD;  Location[facility];  Service: Cardiovascular;  Laterality: N/A;    Assessment & Plan Clinical Impression: Cody Velez a 72 y.o.right-handed malewith history of hypertension, memory deficits maintained on Aricept and Namenda, prostate cancer, PTSD, chronic back pain with multiple lumbar surgeries with recent spinal cord stimulator placement January 2019 as well as treatment for recent UTI. Per chart review patient lives with girlfriend. Independent with a cane prior to admissionand still driving. 2 level home with bedroom on main and 7 steps to entry with plan for ramp. Girlfriend can assist as neededhowever she is currently on vacation in [place] until April 20. Presented 03/16/2018 with lethargy/fall as well as hypoxemia with paraparesis. CT Angioof the chest negative for pulmonary emboli. Cranial CT scan negative for acute changes. Echocardiogram with ejection fraction of 60%. Systolic function was normal. EEG negative. X-rays and imaging revealed osteomyelitis T10-T11 with epidural abscess. Infectious disease consulted for bacteremia due to MSSA and maintained on broad-spectrum antibiotics of intravenous ceftezole and with plan for 8 weeks duration ending June 3, 2019as well as rifampin 300 mg twice daily.  Patient underwent laminectomy T10-T11 decompression of epidural abscess debridement of discitis T10-T11 with segmental fixation from T8-L3 03/28/2018 per Dr. Ellene Route. TLSO back brace applied in sitting position. Hospital course pain management. Physical therapy evaluation completed 03/29/2018 with recommendations of physical medicine rehab consult.Patient was admitted for a comprehensive rehab program   Patient transferred to CIR on 03/30/2018 .   Patient currently requires total with mobility secondary to muscle weakness and abnormal tone.  Prior to hospitalization, patient was independent  with mobility and lived with Significant other in a House home.  Home access is 7(family working on ramp)Stairs to enter.  Patient will benefit from skilled PT intervention to maximize safe functional mobility, minimize fall risk and decrease caregiver burden for planned discharge home with 24 hour assist.  Anticipate patient will benefit from follow up [facility] at discharge.  PT - End of Session Activity Tolerance: Tolerates 10 - 20 min activity with multiple rests Endurance Deficit: Yes Endurance Deficit Description: Multiple rest breaks during bed mobility and fatigues while sitting EOB PT Assessment Rehab Potential (ACUTE/IP ONLY): Good PT Barriers to Discharge: Inaccessible home environment;Medical stability;Incontinence;Neurogenic Bowel & Bladder PT Patient demonstrates impairments in the following area(s): Balance;Endurance;Motor;Pain;Safety;Sensory;Skin Integrity PT Transfers Functional Problem(s): Bed Mobility;Bed to Chair;Car;Furniture;Floor PT Locomotion Functional Problem(s): Ambulation;Wheelchair Mobility;Stairs PT Plan PT Intensity: Minimum of 1-2 x/day ,45 to 90 minutes PT Frequency: 5 out of 7 days PT Duration Estimated Length of Stay:  21-28 days PT Treatment/Interventions: Ambulation/gait training;Balance/vestibular training;Community reintegration;Discharge planning;DME/adaptive equipment  instruction;Functional mobility training;Neuromuscular re-education;Pain management;Patient/family education;Psychosocial support;Skin care/wound management;Splinting/orthotics;Therapeutic Activities;Therapeutic Exercise;UE/LE Strength taining/ROM;UE/LE Coordination activities;Wheelchair propulsion/positioning PT Transfers Anticipated Outcome(s): Min Assist PT Locomotion Anticipated Outcome(s): Supervision to [employer] at w/c level PT Recommendation Recommendations for Other Services: Neuropsych consult;Therapeutic Recreation consult Therapeutic Recreation Interventions: Pet therapy;Stress management;Outing/community reintergration Follow Up Recommendations: Home health PT Patient destination: Home Equipment Recommended: Wheelchair (measurements);Wheelchair cushion (measurements)  Skilled Therapeutic Intervention Evaluation completed (see details above and below) with education on PT POC and goals and individual treatment initiated with focus on bed mobility and education about safety and back precautions. Pt is supine in bed upon therapist arrival, reports 6/10 low back pain at rest and is agreeable to participate in therapy session. Supine to sit with max assist with use of bedrails and HOB elevated, pt requires assist for BLE as well as trunk. Sitting balance EOB with min assist. Pt requires assist x 2 to don TLSO while seated EOB. Attempt to place maxi sling to perform maximove transfer OOB. Pt has incontinence of bowel during sling placement. Sit to supine max assist for LE management. Rolling L/R total assist x 2 for dependent pericare and donning a new brief. Supine to sit with assist x 2. Pt has incontinence of urine while seated EOB. Sit to supine max assist for LE management. Pt left supine in bed in care of NA and RN to complete clothing change and pericare.  PT Evaluation Precautions/Restrictions Precautions Precautions: Fall;Back Precaution Booklet Issued: No Precaution Comments: Pt  needed min cues to recall 3/3 back precautions Required Braces or Orthoses: Spinal Brace Spinal Brace: Thoracolumbosacral orthotic;Applied in sitting position Restrictions Weight Bearing Restrictions: No Home Living/Prior Functioning Home Living Available Help at Discharge: Friend(s);Available 24 hours/day(girlfriend returns 4/20) Type of Home: House Home Access: Stairs to enter [employer] of Steps: 7(family working on ramp) Entrance Stairs-Rails: Right;Left;Can reach both Home Layout: Two level;Able to live on main level with bedroom/bathroom Alternate Level Stairs-Number of Steps: 13 Alternate Level Stairs-Rails: Right Bathroom Shower/Tub: Chiropodist: Standard Bathroom Accessibility: Yes  Lives With: Significant other Prior Function Level of Independence: Independent with gait;Independent with transfers  Able to Take Stairs?: Yes Driving: Yes Vocation: Retired Associate Professor Overall Cognitive Status: Within Functional Limits for tasks assessed Arousal/Alertness: Awake/alert Orientation Level: Oriented X4 Safety/Judgment: Appears intact Sensation Sensation Light Touch: Impaired Detail Light Touch Impaired Details: Impaired RLE;Impaired LLE Proprioception: Impaired Detail Proprioception Impaired Details: Impaired RLE;Impaired LLE Coordination Gross Motor Movements are Fluid and Coordinated: No Fine Motor Movements are Fluid and Coordinated: Yes Motor  Motor Motor: Paraplegia Motor - Skilled Clinical Observations: L>R  Mobility Bed Mobility Bed Mobility: Rolling Right;Rolling Left;Supine to Sit;Sit to Supine Rolling Right: 1: +2 Total assist Rolling Right Details: Manual facilitation for weight shifting Rolling Left: 1: +2 Total assist Rolling Left Details: Manual facilitation for weight shifting Supine to Sit: 2: Max assist Supine to Sit Details: Verbal cues for sequencing;Manual facilitation for weight shifting Sit to Supine: 2: Max  assist Sit to Supine - Details: Manual facilitation for weight shifting Transfers Transfers: No Locomotion  Ambulation Ambulation: No Gait Gait: No Stairs / Additional Locomotion Stairs: No  Trunk/Postural Assessment  Cervical Assessment Cervical Assessment: Within Functional Limits Thoracic Assessment Thoracic Assessment: Exceptions to [facility] Thoracic Strength Overall Thoracic Strength: (needs min assist for sitting balance) Lumbar Assessment Lumbar Assessment: Exceptions to [facility] Lumbar Strength Overall Lumbar Strength: Deficits(needs min assist for sitting balance) Postural Control Postural Control: Deficits on  evaluation Trunk Control: needs min assist for sitting balance  Balance Balance Balance Assessed: Yes Static Sitting Balance Static Sitting - Balance Support: Bilateral upper extremity supported;Feet supported Static Sitting - Level of Assistance: 4: Min assist Dynamic Sitting Balance Dynamic Sitting - Balance Support: Feet supported;Right upper extremity supported Dynamic Sitting - Level of Assistance: 4: Min assist Dynamic Sitting - Balance Activities: Reaching for objects Extremity Assessment  RUE Assessment RUE Assessment: Within Functional Limits LUE Assessment LUE Assessment: Within Functional Limits RLE Assessment RLE Assessment: Exceptions to [facility] RLE PROM (degrees) Overall PROM Right Lower Extremity: Within functional limits for tasks assessed RLE Strength Right Hip Flexion: 2/5 Right Knee Flexion: 2/5 Right Knee Extension: 2+/5 Right Ankle Dorsiflexion: 3/5 Right Ankle Plantar Flexion: 3/5 LLE Assessment LLE Assessment: Exceptions to WFL LLE PROM (degrees) Overall PROM Left Lower Extremity: Within functional limits for tasks assessed LLE Strength Left Hip Flexion: 2/5 Left Knee Flexion: 2/5 Left Knee Extension: 2/5 Left Ankle Dorsiflexion: 2/5 Left Ankle Plantar Flexion: 2/5   See Function Navigator for Current Functional Status.   Refer  to Care Plan for Long Term Goals  Recommendations for other services: Neuropsych and Therapeutic Recreation  Pet therapy, Stress management and Outing/community reintegration  Discharge Criteria: Patient will be discharged from PT if patient refuses treatment 3 consecutive times without medical reason, if treatment goals not met, if there is a change in medical status, if patient makes no progress towards goals or if patient is discharged from hospital.  The above assessment, treatment plan, treatment alternatives and goals were discussed and mutually agreed upon: by patient  Excell Seltzer, PT, DPT  03/31/2018, 12:44 PM

## 2018-03-31 NOTE — Evaluation (Signed)
Occupational Therapy Assessment and Plan  Patient Details  Name: Cody Velez MRN: [id number] Date of Birth: 1946-01-09  OT Diagnosis: lumbago (low back pain), muscle weakness (generalized) and paraplegia Rehab Potential: Rehab Potential (ACUTE ONLY): Good ELOS: 21-28 days   Today's Date: 03/31/2018 OT Individual Time: 1100-1200 OT Individual Time Calculation (min): 60 min     Problem List:  Patient Active Problem List   Diagnosis Date Noted  . Myelopathy ([place]) 03/30/2018  . Paraplegia ([place])   . Postlaminectomy syndrome, lumbar region   . Epidural abscess   . Abdominal distension   . Encephalopathy   . Weakness of both lower extremities   . Discitis of thoracic region   . Spinal cord stimulator status   . Staphylococcus aureus sepsis ([place]) 03/20/2018  . Bacteremia due to methicillin susceptible Staphylococcus aureus (MSSA) 03/18/2018  . Obesity 03/18/2018  . Weakness 03/16/2018  . Fever 03/16/2018  . Sepsis ([place]) 03/16/2018  . Nephrolithiasis 03/16/2018  . AKI (acute kidney injury) ([place]) 03/16/2018  . Acute metabolic encephalopathy 93/71/6967  . Cognitive impairment 03/16/2018  . Chronic pain syndrome 03/16/2018  . B12 deficiency 01/28/2016  . Memory difficulty 07/23/2015  . History of cerebrovascular disease 07/23/2015  . FH: colon cancer 08/13/2014  . Hx of adenomatous colonic polyps 08/13/2014  . Chronic back pain 05/30/2012  . CAD, NATIVE VESSEL 07/17/2010  . HYPERCHOLESTEROLEMIA 10/31/2009  . SMOKELESS TOBACCO ABUSE 10/31/2009  . Obstructive sleep apnea 10/31/2009  . Essential hypertension, benign 10/31/2009  . GERD 10/31/2009    Past Medical History:  Past Medical History:  Diagnosis Date  . Allergic rhinitis   . Arthritis   . Asthma    as a child  . B12 deficiency   . Cancer [facility])    prostate  . Cervicogenic headache 01/28/2016  . Chronic back pain   . Coronary atherosclerosis of native coronary artery    Mild nonobstructive CAD at  catheterization January 2015  . Depression   . Essential hypertension, benign   . Falls   . GERD (gastroesophageal reflux disease)   . History of cerebrovascular disease 07/23/2015  . History of pneumonia 02/2011  . Hyperlipidemia   . Kidney stone   . Memory difficulty 07/23/2015  . OSA (obstructive sleep apnea)    CPAP - Dr. [name]  . Prostate cancer ([place])   . PTSD (post-traumatic stress disorder)    [place]  . Rectal bleeding    Past Surgical History:  Past Surgical History:  Procedure Laterality Date  . BACK SURGERY  02/14/12   lumbar OR #7; "today redid L1L2; replaced screws; added bone from hip"  . BILATERAL KNEE ARTHROSCOPY    . COLONOSCOPY  10/15/2008   Dr. [name]: tubular adenoma   . COLONOSCOPY  12/17/2003   ELF:YBOFBP rectal and colon  . COLONOSCOPY N/A 09/05/2014   Procedure: COLONOSCOPY;  Surgeon: Daneil Dolin, MD;  Location: [facility] ENDO SU[facility];  Service: Endoscopy;  Laterality: N/A;  7:30-rescheduled 9/17 to [place] notified pt  . CYSTOSCOPY WITH STENT PLACEMENT Right 01/27/2016   Procedure: CYSTOSCOPY WITH STENT PLACEMENT;  Surgeon: [name], MD;  Location: [facility] ORS;  Service: Urology;  Laterality: Right;  . CYSTOSCOPY/RETROGRADE/URETEROSCOPY/STONE EXTRACTION WITH BASKET Right 01/27/2016   Procedure: CYSTOSCOPY, RIGHT RETROGRADE, RIGHT URETEROSCOPY, STONE EXTRACTION ;  Surgeon: [name], MD;  Location: [facility] ORS;  Service: Urology;  Laterality: Right;  . ESOPHAGOGASTRODUODENOSCOPY  10/15/2008     Dr Rourk:Schatzki's ring status post dilation and disruption via 66 F Maloney dilator/ otherwise  unremarkable esophagus, small hiatal hernia, multiple fundal gland polyps not manipulated, gastritis, negative H.pylori  . ESOPHAGOGASTRODUODENOSCOPY  06/21/02   VEH:MCNOB sliding hiatal hernia with mild changes of reflux esophagitis limited to gastroesophageal junction.  Noncritical ring at distal esophagus, 3 cm proximal to gastroesophageal junction/Antral gastritis  .  [place]   "broke face playing softball"  . FRACTURE SURGERY     "left wrist; broke it; took spur off"  . HOLMIUM LASER APPLICATION Right 0/08/6282   Procedure: HOLMIUM LASER APPLICATION;  Surgeon: [name], MD;  Location: [facility] ORS;  Service: Urology;  Laterality: Right;  . LEFT HEART CATHETERIZATION WITH CORONARY ANGIOGRAM N/A 12/27/2013   Procedure: LEFT HEART CATHETERIZATION WITH CORONARY ANGIOGRAM;  Surgeon: [name], MD;  Location: [facility] CATH LAB;  Service: Cardiovascular;  Laterality: N/A;  . neck epidural    . SHOULDER SURGERY Bilateral   . TEE WITHOUT CARDIOVERSION N/A 03/21/2018   Procedure: TRANSESOPHAGEAL ECHOCARDIOGRAM (TEE) WITH PROPOFOL;  Surgeon: [name], MD;  Location: [facility];  Service: Cardiovascular;  Laterality: N/A;    Assessment & Plan Clinical Impression: Patient is a 72 y.o. year old male with recent admission 03/16/2018  for weakness and fall. Pt with osteo and epidural abscess T10-11 s/p lami and fixation T8-L3. Pt with significant bil LE weakness post op. PMHx: spinal cord stimulator placement , nephrolithiasis, UTI, HTN, dyslipidemia, morbid obesity, and CAD. Patient transferred to CIR on 03/30/2018 .    Patient currently requires total with basic self-care skills secondary to muscle weakness and muscle paralysis, abnormal tone and unbalanced muscle activation and decreased sitting balance, decreased standing balance, decreased postural control, decreased balance strategies and difficulty maintaining precautions.  Prior to hospitalization, patient could complete BADL with independent .  Patient will benefit from skilled intervention to increase independence with basic self-care skills prior to discharge home with care partner.  Anticipate patient will require minimal physical assistance and follow up home health.  OT - End of Session Endurance Deficit: Yes Endurance Deficit Description: Multiple rest berak within BADL  session OT Assessment Rehab Potential (ACUTE ONLY): Good OT Patient demonstrates impairments in the following area(s): Balance;Endurance;Motor;Pain;Sensory OT Basic ADL's Functional Problem(s): Grooming;Bathing;Dressing;Toileting OT Transfers Functional Problem(s): Toilet;Tub/Shower OT Additional Impairment(s): None OT Plan OT Intensity: Minimum of 1-2 x/day, 45 to 90 minutes OT Frequency: 5 out of 7 days OT Duration/Estimated Length of Stay: 21-28 days OT Treatment/Interventions: Balance/vestibular training;Community reintegration;Discharge planning;Disease mangement/prevention;DME/adaptive equipment instruction;Functional mobility training;Functional electrical stimulation;Neuromuscular re-education;Pain management;Patient/family education;Self Care/advanced ADL retraining;Therapeutic Activities;Therapeutic Exercise;UE/LE Coordination activities;UE/LE Strength taining/ROM;Wheelchair propulsion/positioning OT Basic Self-Care Anticipated Outcome(s): Min A OT Toileting Anticipated Outcome(s): Min A OT Bathroom Transfers Anticipated Outcome(s):  Min A OT Recommendation Patient destination: Home Follow Up Recommendations: Home health OT Equipment Recommended: To be determined Equipment Details: Pt will likely need tub transfer bench and 3-in-1 BSC   Skilled Therapeutic Intervention OT eval completed addressing rehab process, OT purpose, POC, and goals.  Bed level bathing/dressing completed 2/2 pt reporting 10/10 pain. Pt very pleasant and states he is motivated to progress in therapy, but his pain has been the most limiting factor. Pt needed total A for LB bathing and dressing. Rolling L and R with Max/total A to pull pants up over hips. Pt completed UB bathing with HOB raised for back support. Pt able to thread B UEs into shirt, then brought pt into long sitting with max/total A while OT pulled down shirt. Lunch arrived and pt set-up for lunch semi-reclined in bed  with bed alarm on and needs  met.   OT Evaluation Precautions/Restrictions  Precautions Precautions: Fall;Back Precaution Booklet Issued: No Precaution Comments: Pt needed min cues to recall 3/3 back precautions Required Braces or Orthoses: Spinal Brace Spinal Brace: Thoracolumbosacral orthotic;Applied in sitting position Restrictions Weight Bearing Restrictions: No Pain Pain Assessment Pain Scale: 0-10 Pain Score: 8  Pain Type: Acute pain Pain Location: Back Pain Orientation: Upper;Medial Pain Descriptors / Indicators: Aching;Throbbing Pain Frequency: Constant Pain Onset: On-going Patients Stated Pain Goal: 6 Pain Intervention(s): Medication (See eMAR);Repositioned Home Living/Prior Functioning Home Living Available Help at Discharge: Friend(s), Available 24 hours/day(girlfriend returns Apr20) Type of Home: House Home Access: Stairs to enter [employer] of Steps: 7(family states working on ramp) Entrance Stairs-Rails: Right, Left, Can reach both Home Layout: Two level, Able to live on main level with bedroom/bathroom Alternate Level Stairs-Number of Steps: 13 Alternate Level Stairs-Rails: Right Bathroom Shower/Tub: Chiropodist: Standard Bathroom Accessibility: Yes  Lives With: Significant other IADL History Current License: Yes Occupation: Retired IADL Comments: Retired Retail buyer Prior Function Level of Independence: Independent with basic ADLs, Independent with homemaking with ambulation  Able to Take Stairs?: Yes Driving: Yes Vocation: Retired ADL ADL ADL Comments: Please see functional navigator Vision Baseline Vision/History: Wears glasses Wears Glasses: At all times  Cognition Overall Cognitive Status: Within Functional Limits for tasks assessed Arousal/Alertness: Awake/alert Orientation Level: Person;Place;Situation Person: Oriented Place: Oriented Situation: Oriented Year: 2019 Month: April Day of Week: Correct Memory: Appears  intact Immediate Memory Recall: Sock;Bed;Blue Memory Recall: Blue;Sock;Bed Memory Recall Sock: Without Cue Memory Recall Blue: Without Cue Memory Recall Bed: Without Cue Safety/Judgment: Appears intact Sensation Sensation Light Touch: Impaired Detail Light Touch Impaired Details: Impaired RLE;Impaired LLE Proprioception: Impaired Detail Proprioception Impaired Details: Impaired LLE;Impaired RLE Coordination Gross Motor Movements are Fluid and Coordinated: No Fine Motor Movements are Fluid and Coordinated: Yes Motor  Motor Motor: Paraplegia Motor - Skilled Clinical Observations: L>R Mobility  Bed Mobility Bed Mobility: Rolling Right;Rolling Left;Supine to Sit;Sit to Supine Rolling Right: 1: +2 Total assist Rolling Right Details: Manual facilitation for weight shifting Rolling Left: 1: +2 Total assist Rolling Left Details: Manual facilitation for weight shifting Supine to Sit: 2: Max assist Supine to Sit Details: Verbal cues for sequencing;Manual facilitation for weight shifting Sit to Supine: 2: Max assist Sit to Supine - Details: Manual facilitation for weight shifting  Trunk/Postural Assessment  Cervical Assessment Cervical Assessment: Within Functional Limits Thoracic Assessment Thoracic Assessment: Exceptions to [facility] Thoracic Strength Overall Thoracic Strength: (needs min assist for sitting balance) Lumbar Assessment Lumbar Assessment: Exceptions to [facility] Lumbar Strength Overall Lumbar Strength: Deficits Postural Control Postural Control: Deficits on evaluation Trunk Control: needs min assist for sitting balance  Balance Balance Balance Assessed: Yes Static Sitting Balance Static Sitting - Balance Support: Bilateral upper extremity supported;Feet supported Static Sitting - Level of Assistance: 4: Min assist Dynamic Sitting Balance Dynamic Sitting - Balance Support: Feet supported;Right upper extremity supported Dynamic Sitting - Level of Assistance: 4: Min  assist Dynamic Sitting - Balance Activities: Reaching for objects Extremity/Trunk Assessment RUE Assessment RUE Assessment: Within Functional Limits LUE Assessment LUE Assessment: Within Functional Limits   See Function Navigator for Current Functional Status.   Refer to Care Plan for Long Term Goals  Recommendations for other services: None at this time, possibly rec therapy eval once appropriate   Discharge Criteria: Patient will be discharged from OT if patient refuses treatment 3 consecutive times without medical reason, if treatment goals not met, if there  is a change in medical status, if patient makes no progress towards goals or if patient is discharged from hospital.  The above assessment, treatment plan, treatment alternatives and goals were discussed and mutually agreed upon: by patient  Valma Cava 03/31/2018, 4:16 PM

## 2018-03-31 NOTE — IPOC Note (Addendum)
Overall Plan of Care [facility]) Patient Details Name: Cody Velez MRN: [id number] DOB: 1946/01/22  Admitting Diagnosis: Paraplegia [facility])  Hospital Problems: Principal Problem:   Paraplegia ([place]) Active Problems:   Myelopathy ([place])   Postlaminectomy syndrome, lumbar region   Postoperative pain   DVT, lower extremity, distal, acute, bilateral ([place])   Acute blood loss anemia   Anemia of chronic disease   Essential hypertension   Bacteremia     Functional Problem List: Nursing Bladder, Bowel, Pain, Skin Integrity  PT Balance, Endurance, Motor, Pain, Safety, Sensory, Skin Integrity  OT Balance, Endurance, Motor, Pain, Sensory  SLP    TR         Basic ADL's: OT Grooming, Bathing, Dressing, Toileting     Advanced  ADL's: OT       Transfers: PT Bed Mobility, Bed to Chair, Car, [employer], Futures trader, Metallurgist: PT Ambulation, Emergency planning/management officer, Stairs     Additional Impairments: OT None  SLP        TR      Anticipated Outcomes Item Anticipated Outcome  Self Feeding    Swallowing      Basic self-care  Min A  Toileting  Min A   Bathroom Transfers  Min A  Bowel/Bladder  pt will be continent x 2   Teacher, English as a foreign language to [employer] at w/c level  Communication     Cognition     Pain  pain will be less than 3  Safety/Judgment  pt will remain fall free while in rehab   Therapy Plan: PT Intensity: Minimum of 1-2 x/day ,45 to 90 minutes PT Frequency: 5 out of 7 days PT Duration Estimated Length of Stay: 21-28 days OT Intensity: Minimum of 1-2 x/day, 45 to 90 minutes OT Frequency: 5 out of 7 days OT Duration/Estimated Length of Stay: 21-28 days      Team Interventions: Nursing Interventions Patient/Family Education, Disease Management/Prevention, Skin Care/Wound Management, Discharge Planning, Bladder Management, Pain Management, Bowel Management  PT interventions Ambulation/gait training,  Training and development officer, Community reintegration, Discharge planning, DME/adaptive equipment instruction, Functional mobility training, Neuromuscular re-education, Pain management, Patient/family education, Psychosocial support, Skin care/wound management, Splinting/orthotics, Therapeutic Activities, Therapeutic Exercise, UE/LE Strength taining/ROM, UE/LE Coordination activities, Wheelchair propulsion/positioning  OT Interventions Training and development officer, Academic librarian, Discharge planning, Disease mangement/prevention, Engineer, drilling, Functional mobility training, Functional electrical stimulation, Neuromuscular re-education, Pain management, Patient/family education, Self Care/advanced ADL retraining, Therapeutic Activities, Therapeutic Exercise, UE/LE Coordination activities, UE/LE Strength taining/ROM, Wheelchair propulsion/positioning  SLP Interventions    TR Interventions    SW/CM Interventions     Barriers to Discharge MD  Medical stability, IV antibiotics and Weight  Nursing IV antibiotics    PT Inaccessible home environment, Medical stability, Incontinence, Neurogenic Bowel & Bladder    OT      SLP      SW       Team Discharge Planning: Destination: PT-Home ,OT- Home , SLP-  Projected Follow-up: PT-Home health PT, OT-  Home health OT, SLP-  Projected Equipment Needs: PT-Wheelchair (measurements), Wheelchair cushion (measurements), OT- To be determined, SLP-  Equipment Details: PT- , OT-Pt will likely need tub transfer bench and 3-in-1 BSC Patient/family involved in discharge planning: PT- Patient,  OT-Patient, SLP-   MD ELOS: 16-19d Medical Rehab Prognosis:  Good Assessment:  72 y.o.right-handed malewith history of hypertension, memory deficits maintained on Aricept and Namenda, prostate cancer, PTSD, chronic back pain with multiple lumbar surgeries with recent  spinal cord stimulator placement January 2019 as well as treatment for recent  UTI. Per chart review patient lives with girlfriend. Independent with a cane prior to admissionand still driving. 2 level home with bedroom on main and 7 steps to entry with plan for ramp. Girlfriend can assist as neededhowever she is currently on vacation in [place] until April 20. Presented 03/16/2018 with lethargy/fall as well as hypoxemia with paraparesis. CT Angioof the chest negative for pulmonary emboli. Cranial CT scan negative for acute changes. Echocardiogram with ejection fraction of 60%. Systolic function was normal. EEG negative. X-rays and imaging revealed osteomyelitis T10-T11 with epidural abscess. Infectious disease consulted for bacteremia due to MSSA and maintained on broad-spectrum antibiotics of intravenous ceftezole and with plan for 8 weeks duration ending June 3, 2019as well as rifampin 300 mg twice daily. Patient underwent laminectomy T10-T11 decompression of epidural abscess debridement of discitis T10-T11 with segmental fixation from T8-L3 03/28/2018 per Dr. Ellene Route. TLSO back brace applied in sitting position. Hospital course pain management   Now requiring 24/7 Rehab RN,MD, as well as CIR level PT, OT and SLP.  Treatment team will focus on ADLs and mobility with goals set at [facility]   See Team Conference Notes for weekly updates to the plan of care

## 2018-04-01 ENCOUNTER — Inpatient Hospital Stay (HOSPITAL_COMMUNITY): Payer: Federal, State, Local not specified - PPO | Admitting: Physical Therapy

## 2018-04-01 ENCOUNTER — Inpatient Hospital Stay (HOSPITAL_COMMUNITY): Payer: Federal, State, Local not specified - PPO | Admitting: Occupational Therapy

## 2018-04-01 DIAGNOSIS — I82493 Acute embolism and thrombosis of other specified deep vein of lower extremity, bilateral: Secondary | ICD-10-CM

## 2018-04-01 DIAGNOSIS — R7881 Bacteremia: Secondary | ICD-10-CM

## 2018-04-01 DIAGNOSIS — F0390 Unspecified dementia without behavioral disturbance: Secondary | ICD-10-CM

## 2018-04-01 DIAGNOSIS — D638 Anemia in other chronic diseases classified elsewhere: Secondary | ICD-10-CM

## 2018-04-01 DIAGNOSIS — D62 Acute posthemorrhagic anemia: Secondary | ICD-10-CM

## 2018-04-01 DIAGNOSIS — G8918 Other acute postprocedural pain: Secondary | ICD-10-CM

## 2018-04-01 MED ORDER — LIDOCAINE 5 % EX PTCH
1.0000 | MEDICATED_PATCH | CUTANEOUS | Status: DC
  Administered 2018-04-01: 1 via TRANSDERMAL
  Filled 2018-04-01 (×2): qty 1

## 2018-04-01 NOTE — Progress Notes (Signed)
Pharmacy Antibiotic Note  Cody Velez is a 72 y.o. male admitted on 03/30/2018 with MSSA bacteremia and extensive epidural abscess discitis.  Pharmacy has been monitoring cefazolin dosing with plans to continue until June 3rd.  Creatinine downtrending to 0.80. WBC is within normal limits and patient remains afebrile.   Plan: Continue cefazolin 2gm IV every 8 hours for 8 weeks total to end on 05/22/2018 Rifampin 300mg  PO q12h  Monitor labs and clinical progression   Height: 6\' 3"  (190.5 cm) Weight: 284 lb 13.4 oz (129.2 kg) IBW/kg (Calculated) : 84.5  Temp (24hrs), Avg:98.7 F (37.1 C), Min:98.6 F (37 C), Max:98.8 F (37.1 C)  Recent Labs  Lab 03/26/18 1645 03/27/18 0455 03/28/18 1723 03/29/18 0439 03/31/18 0821  WBC 9.6 10.1 15.9* 12.8* 7.9  CREATININE 0.97 0.96 1.09 1.03 0.80    Estimated Creatinine Clearance: 122.7 mL/min (by C-G formula based on SCr of 0.8 mg/dL).    No Known Allergies  Antimicrobials this admission: Vancomycin 3/28 >> 3/30 Zosyn 3/28 >> 3/30 Ancef 3/30 >> 6/3 Rifampin 4/11>> 6/3     Microbiology results: 3/30 Repeat BCx: ngf 3/27 BCx: 1 or 2 = MSSA 3/27 UCx: insignificant growth   [name], Pharm.D. PGY1 Pharmacy Resident 04/01/2018 9:11 AM Main Pharmacy: [phone]

## 2018-04-01 NOTE — Progress Notes (Signed)
Occupational Therapy Session Note  Patient Details  Name: Cody Velez MRN: [id number] Date of Birth: 18-Mar-1946  Today's Date: 04/01/2018 OT Individual Time: [phone] OT Individual Time Calculation (min): 30 min  Short Term Goals: Week 1:  OT Short Term Goal 1 (Week 1): Pt will tolerate sitting EOB for 5 mins in preparation for BADL tasks. OT Short Term Goal 2 (Week 1): Pt will direct care to don TLSO with min A from caregiver OT Short Term Goal 3 (Week 1): Pt will maintain sitting balance at EOB with no more than mod A in preparation for BADL tasks OT Short Term Goal 4 (Week 1): Pt will recall 3/3 back precautions without cues  Skilled Therapeutic Interventions/Progress Updates:    Pt greeted sitting in wc and agreeable to OT. Pt brought to therapy dayroom and attempted sit<>stand with standing frame. Unable to get belt correctly around pt without it hitting brace, so deferred standing frame. Returned to room and worked on standing with stedy. Pt needed +3 assistance to come to standing with 2 trials. Pt tolerated sitting upright in stedy for 1 minute prior to fatigue. Pt returned to sitting in wc and left with family present and needs met.   Therapy Documentation Precautions:  Precautions Precautions: Fall, Back Precaution Booklet Issued: No Precaution Comments: Pt needed min cues to recall 3/3 back precautions Required Braces or Orthoses: Spinal Brace Spinal Brace: Thoracolumbosacral orthotic, Applied in sitting position Restrictions Weight Bearing Restrictions: No  ADL: ADL ADL Comments: Please see functional navigator  See Function Navigator for Current Functional Status.   Therapy/Group: Individual Therapy  Valma Cava 04/01/2018, 3:49 PM

## 2018-04-01 NOTE — Progress Notes (Signed)
Subjective/Complaints: Patient seen lying in bed this morning. He states he did not sleep well overnight due to back pain. Family at bedside, who stated patient had a rough day therapies yesterday and was cut short due to findings of DVT.  ROS- + back pain. Denies CP, SOB, nausea, vomiting, diarrhea.  Objective: Vital Signs: Blood pressure 129/73, pulse 82, temperature 98.6 F (37 C), temperature source Oral, resp. rate 18, height 6' 3" (1.905 m), weight 129.2 kg (284 lb 13.4 oz), SpO2 97 %. No results found. Results for orders placed or performed during the hospital encounter of 03/30/18 (from the past 72 hour(s))  CBC WITH DIFFERENTIAL     Status: Abnormal   Collection Time: 03/31/18  8:21 AM  Result Value Ref Range   WBC 7.9 4.0 - 10.5 K/uL   RBC 2.94 (L) 4.22 - 5.81 MIL/uL   Hemoglobin 7.8 (L) 13.0 - 17.0 g/dL   HCT 25.6 (L) 39.0 - 52.0 %   MCV 87.1 78.0 - 100.0 fL   MCH 26.5 26.0 - 34.0 pg   MCHC 30.5 30.0 - 36.0 g/dL   RDW 17.1 (H) 11.5 - 15.5 %   Platelets 529 (H) 150 - 400 K/uL   Neutrophils Relative % 74 %   Neutro Abs 5.8 1.7 - 7.7 K/uL   Lymphocytes Relative 16 %   Lymphs Abs 1.3 0.7 - 4.0 K/uL   Monocytes Relative 6 %   Monocytes Absolute 0.5 0.1 - 1.0 K/uL   Eosinophils Relative 4 %   Eosinophils Absolute 0.3 0.0 - 0.7 K/uL   Basophils Relative 0 %   Basophils Absolute 0.0 0.0 - 0.1 K/uL    Comment: Performed at [facility], [address], [place], [place] [address]  Comprehensive metabolic panel     Status: Abnormal   Collection Time: 03/31/18  8:21 AM  Result Value Ref Range   Sodium 135 135 - 145 mmol/L   Potassium 4.0 3.5 - 5.1 mmol/L   Chloride 99 (L) 101 - 111 mmol/L   CO2 28 22 - 32 mmol/L   Glucose, Bld 111 (H) 65 - 99 mg/dL   BUN 11 6 - 20 mg/dL   Creatinine, Ser 0.80 0.61 - 1.24 mg/dL   Calcium 8.6 (L) 8.9 - 10.3 mg/dL   Total Protein 6.6 6.5 - 8.1 g/dL   Albumin 2.0 (L) 3.5 - 5.0 g/dL   AST 42 (H) 15 - 41 U/L   ALT 23 17 - 63 U/L    Alkaline Phosphatase 81 38 - 126 U/L   Total Bilirubin 0.4 0.3 - 1.2 mg/dL   GFR calc non Af Amer >60 >60 mL/min   GFR calc Af Amer >60 >60 mL/min    Comment: (NOTE) The eGFR has been calculated using the CKD EPI equation. This calculation has not been validated in all clinical situations. eGFR's persistently <60 mL/min signify possible Chronic Kidney Disease.    Anion gap 8 5 - 15    Comment: Performed at [facility] [address] [place], [place] [address]     HEENT: normocephalic, atraumatic. Cardio: RRR and no JVD. Resp: CTA B/L and unlabored GI: BS positive and non distended Skin:   Intact. Warm and dry. Neuro: Alert and Oriented Sensation diminished to light touch bilateral lower extremities Motor: 5/5 in BUE proximal to distal RLE: hip flexion 2+/5, knee extension 3/5, ankle dorsiflexion 3+/5 LLE: Hip flexion, knee extension 0/5, ankle dorsiflexion 3 -/5 Musc/Skel:  No edema or tenderness  in extremities. Gen NAD. Vital signs reviewed.   Assessment/Plan: 1. Functional deficits secondary to paraplegia secondary to epidural abscess which require 3+ hours per day of interdisciplinary therapy in a comprehensive inpatient rehab setting. Physiatrist is providing close team supervision and 24 hour management of active medical problems listed below. Physiatrist and rehab team continue to assess barriers to discharge/monitor patient progress toward functional and medical goals. FIM: Function - Bathing Position: Bed Body parts bathed by patient: Right arm, Left arm, Chest, Abdomen Body parts bathed by helper: Front perineal area, Buttocks, Right upper leg, Left upper leg, Right lower leg, Left lower leg Assist Level: 2 helpers  Function- Upper Body Dressing/Undressing What is the patient wearing?: Pull over shirt/dress Pull over shirt/dress - Perfomed by patient: Thread/unthread right sleeve, Thread/unthread left sleeve Pull over shirt/dress - Perfomed by helper: Put  head through opening, Pull shirt over trunk Assist Level: Touching or steadying assistance(Pt > 75%) Function - Lower Body Dressing/Undressing What is the patient wearing?: Pants, Non-skid slipper socks Position: Bed Pants- Performed by helper: Thread/unthread right pants leg, Thread/unthread left pants leg, Pull pants up/down Non-skid slipper socks- Performed by helper: Don/doff right sock, Don/doff left sock Assist for footwear: Dependant Assist for lower body dressing: 2 Helpers  Function - Toileting Toileting activity did not occur: No continent bowel/bladder event  Function - Air cabin crew transfer activity did not occur: Safety/medical concerns  Function - Chair/bed transfer Chair/bed transfer activity did not occur: Safety/medical concerns  Function - Locomotion: Ambulation Ambulation activity did not occur: Safety/medical concerns  Function - Comprehension Comprehension: Auditory Comprehension assist level: Follows complex conversation/direction with extra time/assistive device  Function - Expression Expression: Verbal Expression assist level: Expresses complex ideas: With extra time/assistive device  Function - Social Interaction Social Interaction assist level: Interacts appropriately with others - No medications needed.  Function - Problem Solving Problem solving assist level: Solves complex problems: With extra time  Function - Memory Memory assist level: Complete Independence: No helper Patient normally able to recall (first 3 days only): Current season, Location of own room, Staff names and faces, That he or she is in a hospital  Medical Problem List and Plan: 1.Decreased functional mobilitysecondary to T10-11 osteomyelitis/epidural abscess with subsequent myelopathy/paraplegia, status post decompression and fusion T8-L3. Back brace when out of bed. Patient with history of multiple lumbar surgeries as well as spinal cord stimulator January 2019    May resume CIR   Notes reviewed, labs reviewed 2. DVT Prophylaxis/Anticoagulation: SCDs.    Dopplers showing right peroneal and left soleal DVTs, discussed with surgery prophylactic doses of Lovenox started on 4/12. Plans for repeat Dopplers next Wednesday 3. Pain Management:Neurontin 600 mg 3 times daily, Robaxin and oxycodone as needed   Lidoderm patch started on 4/13 4. Mood:Aricept 10 mg nightly, Namenda 10 mg twice daily, Effexor sore 150 mg twice daily, trazodone 100 mg nightly 5. Neuropsych: This patientis?fully capable of making decisions on hisown behalf. 6. Skin/Wound Care:Routine skin checks 7. Fluids/Electrolytes/Nutrition:Routine I/O's  BMP within acceptable range on 4/12, with? Potassium trending down, labs ordered for Monday 8.ID/MSSA bacteremia.Intravenous Ancef 2 g every 8 hours through 05/22/2018 and stop as well as rifampin 300 mg twice daily. Follow-up per infectious disease 9.Acute on chronic anemia. Latest hemoglobin 8.9. Follow-up chemistries   Hemoglobin 7.8 on 4/12   Labs ordered for Monday 10.Hypertension. Norvasc 10 mg daily   Relatively controlled on 4/13 11.Hyperlipidemia. Pravachol 12.History of prostate cancer. Proscar 5 mg daily 13.Constipation. Laxative assistance  LOS (Days) 2  A FACE TO FACE EVALUATION WAS PERFORMED   Lorie Phenix 04/01/2018, 10:07 AM

## 2018-04-01 NOTE — Progress Notes (Signed)
Occupational Therapy Session Note  Patient Details  Name: Cody Velez MRN: [id number] Date of Birth: Jun 02, 1946  Today's Date: 04/01/2018 OT Individual Time: 1016-1100 and 1420-1500 OT Individual Time Calculation (min): 44 min and 40 min   Short Term Goals: Week 1:  OT Short Term Goal 1 (Week 1): Pt will tolerate sitting EOB for 5 mins in preparation for BADL tasks. OT Short Term Goal 2 (Week 1): Pt will direct care to don TLSO with min A from caregiver OT Short Term Goal 3 (Week 1): Pt will maintain sitting balance at EOB with no more than mod A in preparation for BADL tasks OT Short Term Goal 4 (Week 1): Pt will recall 3/3 back precautions without cues  Skilled Therapeutic Interventions/Progress Updates:    Pt greeted supine in bed. Required encouragement to get OOB due to fatigue/back discomfort. Motivated to complete grooming tasks. Tx focus on sitting balance and OOB tolerance. Supine<sit completed with 2 helpers via logroll technique. Once EOB, he required unilateral UE support to maintain balance with Min-Mod A. Mod cues required for maintaining upright neutral alignment during oral care and face washing. Pt then returned to bed with 2 assist, boosted himself up with use of headboard. 2 helpers for repositioning/changing chuck pad. Pt left with all needs within reach.   Pt required significant amount of time and rest breaks when assisting during all transitional movements.   2nd Session 1:1 tx (40 min) Pt greeted supine in bed, agreeable to tx with encouragement. Tx focus on functional transfers, sit<stands, and endurance. 2 helpers for bed mobility and transition to EOB. 1 helper assisted with balance while the other helper donned TLSO. Sit<stand in bariatric Stedy with heavy +2 assist for lifting pants over hips and for transfer to w/c. Once seated, pt self propelled to dayroom. Had him weave around grandchildren and therapist for UB strengthening/endurance while listening to  favorite music. Min vcs for avoiding obstacles on Rt. His affect visibly brightened with involvement of family during session. Afterwards he self propelled back to room and was left with family. Anticipating next therapist.   Therapy Documentation Precautions:  Precautions Precautions: Fall, Back Precaution Booklet Issued: No Precaution Comments: Pt needed min cues to recall 3/3 back precautions Required Braces or Orthoses: Spinal Brace Spinal Brace: Thoracolumbosacral orthotic, Applied in sitting position Restrictions Weight Bearing Restrictions: No Pain: Back pain. RN notified and provided medication at start of session  Pain Assessment Pain Scale: 0-10 Pain Score: 6  Pain Type: Acute pain;Surgical pain Pain Location: Back Pain Descriptors / Indicators: Aching Pain Onset: Gradual Pain Intervention(s): Medication (See eMAR) ADL: ADL ADL Comments: Please see functional navigator     See Function Navigator for Current Functional Status.   Therapy/Group: Individual Therapy  Rayburn Mundis A Rheta Hemmelgarn 04/01/2018, 12:55 PM

## 2018-04-01 NOTE — Progress Notes (Signed)
Physical Therapy Session Note  Patient Details  Name: Cody Velez MRN: [id number] Date of Birth: Feb 18, 1946  Today's Date: 04/01/2018 PT Individual Time: 7356-7014 AND 1605-1700 PT Individual Time Calculation (min): 45 min AND 55 min   Short Term Goals: Week 1:  PT Short Term Goal 1 (Week 1): Pt will complete bed mobility with max assist x 1 consistently PT Short Term Goal 2 (Week 1): Pt will tolerate sitting EOB x 10 min with CGA PT Short Term Goal 3 (Week 1): Pt will perform least restrictive transfer with max assist x 1 PT Short Term Goal 4 (Week 1): Pt will propel manual w/c x 50 ft with CGA  Skilled Therapeutic Interventions/Progress Updates:  Session 1 Pt received supine in bed and agreeable to PT. Supine>sit transfer with max assist and max cues for safety and UE placement. PT applied pt back brace in sitting position with total assist to manage brace and min assist to maintain balance. BLE AROM for knee flexion/extension x 10 AAROM x 10 to improve knee extensionin BLE. Pt noted to have significant contracture in HS. PT perform PNF contract relax HS stretch 3 x 20 sec stretch and 6 sec hold. X 2 bouts BLE. Sitting balance with forward and later reaches x 10 BUE with min assist to prevent LOB in all directions.  Pt returned to supine with max assist from PT through log roll. Pt left in bad with call bell in reach and all needs met.   Session 2   Pt received sitting in WC and agreeable to PT. WC mobility x 80f with min-supervision assist. Min cues for technique as well as posture in WC. SB transfer x 2 with max-total assist from PT; significant increase in time due to poor ability to use. Max cues for anterior weight shifting to allow improved pelvic mobility, and increase clearance as well as proper UE placement to improve safety. Pt transported back to room. Stedy transfer with total +2 transfer to bed. PT assisted pt to doff TLSO. Sit>supine with max assist for BLE management.         Therapy Documentation Precautions:  Precautions Precautions: Fall, Back Precaution Booklet Issued: No Precaution Comments: Pt needed min cues to recall 3/3 back precautions Required Braces or Orthoses: Spinal Brace Spinal Brace: Thoracolumbosacral orthotic, Applied in sitting position Restrictions Weight Bearing Restrictions: No Vital Signs: Therapy Vitals Temp: 97.8 F (36.6 C) Temp Source: Oral Pulse Rate: 94 Resp: 18 BP: 125/79 Patient Position (if appropriate): Sitting Oxygen Therapy SpO2: 99 % O2 Device: Room Air Pain: 5/10 mid back. Surgical   See Function Navigator for Current Functional Status.   Therapy/Group: Individual Therapy  ALorie Phenix4/13/2019, 3:53 PM

## 2018-04-02 ENCOUNTER — Inpatient Hospital Stay (HOSPITAL_COMMUNITY): Payer: Federal, State, Local not specified - PPO | Admitting: Occupational Therapy

## 2018-04-02 DIAGNOSIS — I824Z3 Acute embolism and thrombosis of unspecified deep veins of distal lower extremity, bilateral: Secondary | ICD-10-CM

## 2018-04-02 DIAGNOSIS — I10 Essential (primary) hypertension: Secondary | ICD-10-CM

## 2018-04-02 DIAGNOSIS — D638 Anemia in other chronic diseases classified elsewhere: Secondary | ICD-10-CM

## 2018-04-02 DIAGNOSIS — G8918 Other acute postprocedural pain: Secondary | ICD-10-CM

## 2018-04-02 DIAGNOSIS — R7881 Bacteremia: Secondary | ICD-10-CM

## 2018-04-02 DIAGNOSIS — D62 Acute posthemorrhagic anemia: Secondary | ICD-10-CM

## 2018-04-02 LAB — AEROBIC/ANAEROBIC CULTURE (SURGICAL/DEEP WOUND): Culture: NO GROWTH

## 2018-04-02 LAB — AEROBIC/ANAEROBIC CULTURE W GRAM STAIN (SURGICAL/DEEP WOUND)

## 2018-04-02 MED ORDER — METHOCARBAMOL 500 MG PO TABS
500.0000 mg | ORAL_TABLET | Freq: Four times a day (QID) | ORAL | Status: DC
  Administered 2018-04-02 – 2018-05-06 (×136): 500 mg via ORAL
  Filled 2018-04-02 (×138): qty 1

## 2018-04-02 MED ORDER — LIDOCAINE 5 % EX PTCH
1.0000 | MEDICATED_PATCH | CUTANEOUS | Status: DC
  Administered 2018-04-02 – 2018-04-05 (×4): 1 via TRANSDERMAL
  Filled 2018-04-02 (×15): qty 1

## 2018-04-02 NOTE — Progress Notes (Signed)
Subjective/Complaints: Patient seen lying in bed this morning. He states he slept fairly overnight. He did not have any improvement in back pain with Lidoderm.  ROS- +Back pain. Denies CP, SOB, nausea, vomiting, diarrhea.  Objective: Vital Signs: Blood pressure 108/65, pulse 88, temperature 99 F (37.2 C), temperature source Oral, resp. rate 17, height '6\' 3"'  (1.905 m), weight 127.9 kg (281 lb 15.5 oz), SpO2 94 %. No results found. Results for orders placed or performed during the hospital encounter of 03/30/18 (from the past 72 hour(s))  CBC WITH DIFFERENTIAL     Status: Abnormal   Collection Time: 03/31/18  8:21 AM  Result Value Ref Range   WBC 7.9 4.0 - 10.5 K/uL   RBC 2.94 (L) 4.22 - 5.81 MIL/uL   Hemoglobin 7.8 (L) 13.0 - 17.0 g/dL   HCT 25.6 (L) 39.0 - 52.0 %   MCV 87.1 78.0 - 100.0 fL   MCH 26.5 26.0 - 34.0 pg   MCHC 30.5 30.0 - 36.0 g/dL   RDW 17.1 (H) 11.5 - 15.5 %   Platelets 529 (H) 150 - 400 K/uL   Neutrophils Relative % 74 %   Neutro Abs 5.8 1.7 - 7.7 K/uL   Lymphocytes Relative 16 %   Lymphs Abs 1.3 0.7 - 4.0 K/uL   Monocytes Relative 6 %   Monocytes Absolute 0.5 0.1 - 1.0 K/uL   Eosinophils Relative 4 %   Eosinophils Absolute 0.3 0.0 - 0.7 K/uL   Basophils Relative 0 %   Basophils Absolute 0.0 0.0 - 0.1 K/uL    Comment: Performed at [facility], [address], [place], [place] [address]  Comprehensive metabolic panel     Status: Abnormal   Collection Time: 03/31/18  8:21 AM  Result Value Ref Range   Sodium 135 135 - 145 mmol/L   Potassium 4.0 3.5 - 5.1 mmol/L   Chloride 99 (L) 101 - 111 mmol/L   CO2 28 22 - 32 mmol/L   Glucose, Bld 111 (H) 65 - 99 mg/dL   BUN 11 6 - 20 mg/dL   Creatinine, Ser 0.80 0.61 - 1.24 mg/dL   Calcium 8.6 (L) 8.9 - 10.3 mg/dL   Total Protein 6.6 6.5 - 8.1 g/dL   Albumin 2.0 (L) 3.5 - 5.0 g/dL   AST 42 (H) 15 - 41 U/L   ALT 23 17 - 63 U/L   Alkaline Phosphatase 81 38 - 126 U/L   Total Bilirubin 0.4 0.3 - 1.2 mg/dL   GFR  calc non Af Amer >60 >60 mL/min   GFR calc Af Amer >60 >60 mL/min    Comment: (NOTE) The eGFR has been calculated using the CKD EPI equation. This calculation has not been validated in all clinical situations. eGFR's persistently <60 mL/min signify possible Chronic Kidney Disease.    Anion gap 8 5 - 15    Comment: Performed at [facility] [address] [place], [place] [address]     HEENT: normocephalic, atraumatic. Cardio: RRR and no JVD. Resp: CTA B/L and unlabored GI: BS positive and non distended Skin:   Intact. Warm and dry. Neuro: Alert and Oriented Motor: 5/5 in BUE proximal to distal RLE: hip flexion 2+/5, knee extension 3/5, ankle dorsiflexion 3+/5 LLE: Hip flexion, knee extension 1+/5, ankle dorsiflexion 3 -/5 Musc/Skel:  No edema or tenderness in extremities. Gen NAD. Vital signs reviewed.   Assessment/Plan: 1. Functional deficits secondary to paraplegia secondary to epidural abscess which require 3+ hours per  day of interdisciplinary therapy in a comprehensive inpatient rehab setting. Physiatrist is providing close team supervision and 24 hour management of active medical problems listed below. Physiatrist and rehab team continue to assess barriers to discharge/monitor patient progress toward functional and medical goals. FIM: Function - Bathing Bathing activity did not occur: Refused Position: Bed Body parts bathed by patient: Right arm, Left arm, Chest, Abdomen Body parts bathed by helper: Front perineal area, Buttocks, Right upper leg, Left upper leg, Right lower leg, Left lower leg Assist Level: 2 helpers  Function- Upper Body Dressing/Undressing What is the patient wearing?: Pull over shirt/dress Pull over shirt/dress - Perfomed by patient: Thread/unthread right sleeve, Thread/unthread left sleeve Pull over shirt/dress - Perfomed by helper: Put head through opening, Pull shirt over trunk Assist Level: Touching or steadying assistance(Pt >  75%) Function - Lower Body Dressing/Undressing What is the patient wearing?: Pants, Non-skid slipper socks Position: Bed Pants- Performed by helper: Thread/unthread right pants leg, Thread/unthread left pants leg, Pull pants up/down Non-skid slipper socks- Performed by helper: Don/doff right sock, Don/doff left sock Assist for footwear: Dependant Assist for lower body dressing: 2 Helpers  Function - Toileting Toileting activity did not occur: No continent bowel/bladder event Toileting steps completed by helper: Adjust clothing prior to toileting, Performs perineal hygiene, Adjust clothing after toileting Assist level: Touching or steadying assistance (Pt.75%)  Function - Air cabin crew transfer activity did not occur: Safety/medical concerns  Function - Chair/bed transfer Chair/bed transfer activity did not occur: Safety/medical concerns Chair/bed transfer method: Stand pivot Chair/bed transfer assist level: 2 helpers Chair/bed transfer assistive device: Mechanical lift Mechanical lift: Stedy Chair/bed transfer details: Verbal cues for technique, Verbal cues for precautions/safety, Verbal cues for safe use of DME/AE  Function - Locomotion: Ambulation Ambulation activity did not occur: Safety/medical concerns  Function - Comprehension Comprehension: Auditory Comprehension assist level: Understands basic 90% of the time/cues < 10% of the time  Function - Expression Expression: Verbal Expression assist level: Expresses basic needs/ideas: With no assist  Function - Social Interaction Social Interaction assist level: Interacts appropriately 90% of the time - Needs monitoring or encouragement for participation or interaction.  Function - Problem Solving Problem solving assist level: Solves basic problems with no assist  Function - Memory Memory assist level: Recognizes or recalls 90% of the time/requires cueing < 10% of the time Patient normally able to recall (first 3  days only): Current season, Location of own room, Staff names and faces, That he or she is in a hospital  Medical Problem List and Plan: 1.Decreased functional mobilitysecondary to T10-11 osteomyelitis/epidural abscess with subsequent myelopathy/paraplegia, status post decompression and fusion T8-L3. Back brace when out of bed. Patient with history of multiple lumbar surgeries as well as spinal cord stimulator January 2019   continue CIR 2. DVT Prophylaxis/Anticoagulation: SCDs.    Dopplers showing right peroneal and left soleal DVTs, discussed with surgery prophylactic doses of Lovenox started on 4/12. Plans for repeat Dopplers next Wednesday 3. Pain Management:Neurontin 600 mg 3 times daily,   Robaxin scheduled on 4/14   Oxycodone as needed   Lidoderm patch started on 4/13 with limited benefit 4. Mood:Aricept 10 mg nightly, Namenda 10 mg twice daily, Effexor sore 150 mg twice daily, trazodone 100 mg nightly 5. Neuropsych: This patientis?fully capable of making decisions on hisown behalf. 6. Skin/Wound Care:Routine skin checks 7. Fluids/Electrolytes/Nutrition:Routine I/O's  BMP within acceptable range on 4/12, with? Potassium trending down, labs ordered for tomorrow 8.ID/MSSA bacteremia.Intravenous Ancef 2 g every 8 hours through  05/22/2018 and stop as well as rifampin 300 mg twice daily. Follow-up per infectious disease 9.Acute on chronic anemia. Latest hemoglobin 8.9. Follow-up chemistries   Hemoglobin 7.8 on 4/12   Labs ordered for tomorrow 10.Hypertension. Norvasc 10 mg daily   Controlled on 4/14 11.Hyperlipidemia. Pravachol 12.History of prostate cancer. Proscar 5 mg daily 13.Constipation. Laxative assistance  LOS (Days) 3 A FACE TO FACE EVALUATION WAS PERFORMED  Cody Velez Phenix 04/02/2018, 7:51 AM

## 2018-04-02 NOTE — Progress Notes (Addendum)
Occupational Therapy Session Note  Patient Details  Name: Cody Velez MRN: [id number] Date of Birth: 1946-02-04  Today's Date: 04/02/2018 OT Individual Time: 1030-1314 OT Individual Time Calculation (min): 62 min    Short Term Goals: Week 1:  OT Short Term Goal 1 (Week 1): Pt will tolerate sitting EOB for 5 mins in preparation for BADL tasks. OT Short Term Goal 2 (Week 1): Pt will direct care to don TLSO with min A from caregiver OT Short Term Goal 3 (Week 1): Pt will maintain sitting balance at EOB with no more than mod A in preparation for BADL tasks OT Short Term Goal 4 (Week 1): Pt will recall 3/3 back precautions without cues  Skilled Therapeutic Interventions/Progress Updates:    Pt greeted supine in bed with RN present to administer pain meds. Tx focus on sitting balance, sit<stands, and endurance during bathing and dressing EOB at sit<stand level. 2 helpers for supine<sit via logroll technique (pt=30%). Once EOB, pt requiring Mod A for dynamic balance during UB bathing and dressing tasks. Mod cues for neutral upright alignment due to tendency to lean anteriorly and to the Rt. 2 helpers for donning TLSO, and for sit<stand using bariatric Stedy (pt=40%). He fatigued quickly, but exerted max effort, and able to stand for 15 seconds. Pt became diaphoretic after 3 stands, therefore pants were donned supine in bed, logrolling Rt>Lt with 2 assist to elevate pants over hips. Pt able to boost himself up with use of headboard with bed placed in trendelenberg position. He was repositioned with props to promote neutral hip and knee alignment bilateral LEs due to ER tendencies Pt left with all needs at session exit.   He tolerated sitting EOB during BADL tasks for 30 minutes during session!  Therapy Documentation Precautions:  Precautions Precautions: Fall, Back Precaution Booklet Issued: No Precaution Comments: Pt needed min cues to recall 3/3 back precautions Required Braces or Orthoses:  Spinal Brace Spinal Brace: Thoracolumbosacral orthotic, Applied in sitting position Restrictions Weight Bearing Restrictions: No Pain: Pain Assessment Pain Scale: 0-10 Pain Score: 6  Pain Type: Surgical pain Pain Location: Back Pain Orientation: Mid Pain Descriptors / Indicators: Aching Pain Frequency: Constant Pain Onset: On-going Patients Stated Pain Goal: 2 Pain Intervention(s): Medication (See eMAR) ADL: ADL ADL Comments: Please see functional navigator    See Function Navigator for Current Functional Status.   Therapy/Group: Individual Therapy  Semaj Coburn A Marlos Carmen 04/02/2018, 9:16 AM

## 2018-04-03 ENCOUNTER — Inpatient Hospital Stay (HOSPITAL_COMMUNITY): Payer: Medicare Other

## 2018-04-03 ENCOUNTER — Inpatient Hospital Stay (HOSPITAL_COMMUNITY): Payer: Federal, State, Local not specified - PPO

## 2018-04-03 ENCOUNTER — Encounter (HOSPITAL_COMMUNITY): Payer: Federal, State, Local not specified - PPO | Admitting: Psychology

## 2018-04-03 LAB — CBC WITH DIFFERENTIAL/PLATELET
Basophils Absolute: 0 10*3/uL (ref 0.0–0.1)
Basophils Relative: 0 %
Eosinophils Absolute: 0.3 10*3/uL (ref 0.0–0.7)
Eosinophils Relative: 3 %
HCT: 26.6 % — ABNORMAL LOW (ref 39.0–52.0)
Hemoglobin: 8 g/dL — ABNORMAL LOW (ref 13.0–17.0)
Lymphocytes Relative: 19 %
Lymphs Abs: 1.4 10*3/uL (ref 0.7–4.0)
MCH: 26.2 pg (ref 26.0–34.0)
MCHC: 30.1 g/dL (ref 30.0–36.0)
MCV: 87.2 fL (ref 78.0–100.0)
Monocytes Absolute: 0.7 10*3/uL (ref 0.1–1.0)
Monocytes Relative: 10 %
Neutro Abs: 5 10*3/uL (ref 1.7–7.7)
Neutrophils Relative %: 68 %
Platelets: 579 10*3/uL — ABNORMAL HIGH (ref 150–400)
RBC: 3.05 MIL/uL — ABNORMAL LOW (ref 4.22–5.81)
RDW: 17.3 % — ABNORMAL HIGH (ref 11.5–15.5)
WBC: 7.4 10*3/uL (ref 4.0–10.5)

## 2018-04-03 LAB — BASIC METABOLIC PANEL
Anion gap: 9 (ref 5–15)
BUN: 8 mg/dL (ref 6–20)
CO2: 28 mmol/L (ref 22–32)
Calcium: 8.5 mg/dL — ABNORMAL LOW (ref 8.9–10.3)
Chloride: 100 mmol/L — ABNORMAL LOW (ref 101–111)
Creatinine, Ser: 0.81 mg/dL (ref 0.61–1.24)
GFR calc Af Amer: >60 mL/min (ref >60)
GFR calc non Af Amer: >60 mL/min (ref >60)
Glucose, Bld: 102 mg/dL — ABNORMAL HIGH (ref 65–99)
Potassium: 3.4 mmol/L — ABNORMAL LOW (ref 3.5–5.1)
Sodium: 137 mmol/L (ref 135–145)

## 2018-04-03 MED ORDER — OXYCODONE HCL 5 MG PO TABS
10.0000 mg | ORAL_TABLET | ORAL | Status: DC | PRN
  Administered 2018-04-03 – 2018-05-06 (×92): 10 mg via ORAL
  Filled 2018-04-03 (×97): qty 2

## 2018-04-03 MED ORDER — ACETAMINOPHEN 325 MG PO TABS
650.0000 mg | ORAL_TABLET | Freq: Three times a day (TID) | ORAL | Status: DC
Start: 2018-04-03 — End: 2018-05-06
  Administered 2018-04-03 – 2018-05-06 (×100): 650 mg via ORAL
  Filled 2018-04-03 (×100): qty 2

## 2018-04-03 NOTE — Progress Notes (Signed)
Physical Therapy Note  Patient Details  Name: Cody Velez MRN: [id number] Date of Birth: 1946/05/23 Today's Date: 04/03/2018  1405-1518, 73 min individual tx Pain: 8/10 mid back; premedicated  Pt supine in bed, with dtr [name] in attendance.  Rolling L><R for [name] and PT to refasten diaper.  Rolling with bed features, mod assist for LEs.  In R side lying, bil hip/knee flex/extension x 10.  R side lying> sit with bed features, min assistance.  Pt sat EOB balancing with stand by assistance x 5 minutes while [name] and PT donned TLSO.  From raised bed, use of Stedy with mod assist to stand, x 3 trials, progressing to close supervision.  Transfer to w/c.  wc propulsion on level tile with cues for efficiency and turns; pt locked/unlocked brakes with 1 cue.   neuromuscular re-education via forced use, positioning, multimodal cues for reciprocal marching, in sitting in wc at [place] , level 40 cm/sec with additional resistance from PT x 10 cycles x 2 in upright sitting with bil hand use on grips, and 10 cycles x 2 in trunk extension.  Use of gait belt around thighs assisted in aligning hip/knees/ankles for improved power in mass extension.  Slide board transfer w/c><mat to R/L, slightly downhill, +2.  PT instructed pt in head/hips relationship with minimal carry-over during session.  Pt left resting in w/c with quick release belt applied and all needs within reach; dtr [name] present..  See function navigator for current status. Emmeline Winebarger 04/03/2018, 2:47 PM

## 2018-04-03 NOTE — Consult Note (Signed)
Neuropsychological Consultation   Patient:   Cody Velez   DOB:   11/28/1946  MR Number:  [id number]  Location:  [facility] [id number] [place] [address] Dept: [place]: [phone]           Date of Service:   04/03/2018  Start Time:   10 AM End Time:   11 AM  Provider/Observer:  Ilean Skill, Psy.D.       Clinical Neuropsychologist       Billing Code/Service: [phone] 4 Units  Chief Complaint:    Cody Velez is a 72 year old male with history of hypertension, memory deficits, prostate cancer, PTSD, chronic back pain with multiple lumbar surgeries and spinal cord stimulator placement in January 2019.  Presented on 03/16/2018 after fall as well as hypoxemia and paraparesis.  X-rays revealed osteomyelitis T10-T11 with epidural abscess.  Bacteremia due to MSSA.  Laminectomy and decompression of epidural abscess debridement of discitis with segmental fixation per Dr. Ellene Route.  Patient has prior history of PTSD/Depression as well as reports of decline in memory over past 4 years.  Patient reports that Dr. Jannifer Franklin did a trial of Aricept and Namenda but patient does not fell this has helped much.  He does report that he feels his memory has improved a little as his medical status has improved.  STM and episodic memory remain mildly impaired.  Mental Status is good.    Reason for Service:  Cody Velez was referred for neuropsychological/psycholoigcal due to coping and adjustment concerns as well as prior history of memory decline and PTSD.  Below is the HPI for the current admission.  HPI: Cody Velez history of hypertension, memory deficits maintained on Aricept and Namenda, prostate cancer, PTSD, chronic back pain with multiple lumbar surgeries with recent spinal cord stimulator placement January 2019 as well as treatment for recent UTI.  Per chart review patient lives with girlfriend. Independent with a cane prior to admissionand still driving. 2 level home with bedroom on main and 7 steps to entry with plan for ramp. Girlfriend can assist as neededhowever she is currently on vacation in [place] until April 20. Presented 03/16/2018 with lethargy/fall as well as hypoxemia with paraparesis. CT Angioof the chest negative for pulmonary emboli. Cranial CT scan negative for acute changes. Echocardiogram with ejection fraction of 60%. Systolic function was normal. EEG negative. X-rays and imaging revealed osteomyelitis T10-T11 with epidural abscess. Infectious disease consulted for bacteremia due to MSSA and maintained on broad-spectrum antibiotics of intravenous ceftezole and with plan for 8 weeks duration ending June 3, 2019as well as rifampin 300 mg twice daily. Patient underwent laminectomy T10-T11 decompression of epidural abscess debridement of discitis T10-T11 with segmental fixation from T8-L3 03/28/2018 per Dr. Ellene Route. TLSO back brace applied in sitting position. Hospital course pain management. Physical therapy evaluation completed 03/29/2018 with recommendations of physical medicine rehab consult.Patient was admitted for a comprehensive rehab program   Current Status:  The patient reports that he has not had any acute worsening of his chronic PTSD symptoms stemming from his combat experiences in [place].  He patient reports that he has had long term issues with pain from back injuries and his first surgery was in the 1980s.  He reports that his spinal cord stimulator has really helped with pain in both back and neck (my understanding from patient is that they do not think the infection was  from the SCS).  The patient's mental status was good.  He does report some short term memory issues, but those only were mild deficits today.  The patient did has some mild verbal fluency issues and word finding issues today.  He reports  that his memory and language issues have been showing only small changes over past 3-4 years.  Patient does not report improvement on Aricept etc.  He does has CPAP and we should confirm his is using it.    Behavioral Observation: Cody Velez  presents as a 72 y.o.-year-old Right Caucasian Male who appeared his stated age. his dress was Appropriate and he was Well Groomed and his manners were Appropriate to the situation.  his participation was indicative of Appropriate and Attentive behaviors.  There were physical disabilities noted.  he displayed an appropriate level of cooperation and motivation.     Interactions:    Active Appropriate and Attentive  Attention:   within normal limits and attention span and concentration were age appropriate  Memory:   abnormal; Patient reports 3-4 year changes in new learning and mild word finding issues.    Visuo-spatial:  not examined  Speech (Volume):  normal  Speech:   normal; some very mild word retrieval issues noted today.  Thought Process:  Coherent and Relevant  Though Content:  WNL; not suicidal and not homicidal  Orientation:   person, place, time/date and situation  Judgment:   Good  Planning:   Good  Affect:    Appropriate  Mood:    Dysphoric  Insight:   Good  Intelligence:   very high  Medical History:   Past Medical History:  Diagnosis Date  . Allergic rhinitis   . Arthritis   . Asthma    as a child  . B12 deficiency   . Cancer [facility])    prostate  . Cervicogenic headache 01/28/2016  . Chronic back pain   . Coronary atherosclerosis of native coronary artery    Mild nonobstructive CAD at catheterization January 2015  . Depression   . Essential hypertension, benign   . Falls   . GERD (gastroesophageal reflux disease)   . History of cerebrovascular disease 07/23/2015  . History of pneumonia 02/2011  . Hyperlipidemia   . Kidney stone   . Memory difficulty 07/23/2015  . OSA (obstructive sleep apnea)    CPAP - Dr.  [name]  . Prostate cancer ([place])   . PTSD (post-traumatic stress disorder)    [place]  . Rectal bleeding    Psychiatric History:  Prior history of PTSD from combat military experiences.  Depression and pain past symptoms.  Family Med/Psych History:  Family History  Problem Relation Age of Onset  . Emphysema Father   . Heart failure Father   . Lung cancer Father   . CAD Father   . Colon cancer Mother   . Stroke Mother   . Breast cancer Mother   . Stroke Sister   . Heart attack Brother   . Dementia Paternal Uncle   . Emphysema Maternal Grandmother   . Stroke Maternal Grandmother   . Asthma Other        grandson  . Heart disease Paternal Grandfather   . Anesthesia problems Neg Hx   . Hypotension Neg Hx   . Malignant hyperthermia Neg Hx   . Pseudochol deficiency Neg Hx     Risk of Suicide/Violence: low Patient denies SI or HI  Impression/DX:  Cody Velez is a 73 year old  male with history of hypertension, memory deficits, prostate cancer, PTSD, chronic back pain with multiple lumbar surgeries and spinal cord stimulator placement in January 2019.  Presented on 03/16/2018 after fall as well as hypoxemia and paraparesis.  X-rays revealed osteomyelitis T10-T11 with epidural abscess.  Bacteremia due to MSSA.  Laminectomy and decompression of epidural abscess debridement of discitis with segmental fixation per Dr. Ellene Route.  Patient has prior history of PTSD/Depression as well as reports of decline in memory over past 4 years.  Patient reports that Dr. Jannifer Franklin did a trial of Aricept and Namenda but patient does not fell this has helped much.  He does report that he feels his memory has improved a little as his medical status has improved.  STM and episodic memory remain mildly impaired.  Mental Status is good.  The patient reports that he has not had any acute worsening of his chronic PTSD symptoms stemming from his combat experiences in [place].  He patient reports that he has had long  term issues with pain from back injuries and his first surgery was in the 1980s.  He reports that his spinal cord stimulator has really helped with pain in both back and neck (my understanding from patient is that they do not think the infection was from the SCS).  The patient's mental status was good.  He does report some short term memory issues, but those only were mild deficits today.  The patient did has some mild verbal fluency issues and word finding issues today.  He reports that his memory and language issues have been showing only small changes over past 3-4 years.  Patient does not report improvement on Aricept etc.  He does has CPAP and we should confirm his is using it.   Disposition/Plan:  Will see the patient again to further assess PTSD and adjustment issues.  Diagnosis:    Paraplegia     Chronic PTSD        Electronically Signed   _______________________ Ilean Skill, Psy.D.

## 2018-04-03 NOTE — Progress Notes (Signed)
Patient's home cpap is set up at the bedside and ready for use.

## 2018-04-03 NOTE — Progress Notes (Signed)
Patient ID: Cody Velez, male   DOB: 1946-09-09, 72 y.o.   MRN: [id number] Vital signs are stable stregnth improving Back pain problematic Will check post op xray.

## 2018-04-03 NOTE — Progress Notes (Signed)
Initial Nutrition Assessment  DOCUMENTATION CODES:   Obesity unspecified  INTERVENTION:  Continue Ensure Enlive po BID, each supplement provides 350 kcal and 20 grams of protein.  Encourage adequate PO intake.   NUTRITION DIAGNOSIS:   Inadequate oral intake related to poor appetite as evidenced by per patient/family report.  GOAL:   Patient will meet greater than or equal to 90% of their needs  MONITOR:   PO intake, Supplement acceptance, Labs, Weight trends, I & O's, Skin  REASON FOR ASSESSMENT:   Consult Poor PO  ASSESSMENT:   72 y.o. right-handed male with history of hypertension, memory deficits maintained on Aricept and Namenda, prostate cancer, PTSD, chronic back pain with multiple lumbar surgeries with recent spinal cord stimulator placement January 2019 as well as treatment for recent UTI.  Presented 03/16/2018 with lethargy/fall as well as hypoxemia with paraparesis.  X-rays and imaging revealed osteomyelitis T10-T11 with epidural abscess. Patient underwent laminectomy T10-T11 decompression of epidural abscess debridement of discitis T10-T11 with segmental fixation from T8-L3 03/28/2018.  Meal completion has been 10-80% with 75% intake at breakfast this AM. Pt reports having a decreased appetite which has been ongoing over the past 4 months. He reports consuming a at least 2 meals a day. Pt reports meal completion at meals are varied however reporting trying to force himself to eat. Pt with a 12% weight loss in 5 months. Pt currently has Ensure ordered and has been consuming them. Pt educated on adequate caloric and protein needs. Pt reports understanding of information discussed. Family at bedside has been encouraging po intake at meals.   Labs and medications reviewed.   NUTRITION - FOCUSED PHYSICAL EXAM:    Most Recent Value  Orbital Region  No depletion  Upper Arm Region  No depletion  Thoracic and Lumbar Region  No depletion  Buccal Region  No depletion  Temple  Region  No depletion  Clavicle Bone Region  No depletion  Clavicle and Acromion Bone Region  No depletion  Scapular Bone Region  No depletion  Dorsal Hand  No depletion  Patellar Region  No depletion  Anterior Thigh Region  No depletion  Posterior Calf Region  No depletion  Edema (RD Assessment)  Mild  Hair  Reviewed  Eyes  Reviewed  Mouth  Reviewed  Skin  Reviewed  Nails  Reviewed       Diet Order:  Diet regular Room service appropriate? Yes; Fluid consistency: Thin Fall precautions  EDUCATION NEEDS:   No education needs have been identified at this time  Skin:  Skin Assessment: Skin Integrity Issues: Skin Integrity Issues:: Stage II Stage II: coccyx  Last BM:  4/13  Height:   Ht Readings from Last 1 Encounters:  03/30/18 6\' 3"  (1.905 m)    Weight:   Wt Readings from Last 1 Encounters:  04/03/18 284 lb 8 oz (129 kg)    Ideal Body Weight:  89 kg  BMI:  Body mass index is 35.56 kg/m.  Estimated Nutritional Needs:   Kcal:  2150-2350  Protein:  105-120 grams  Fluid:  >/= 2.1 L/day    Corrin Parker, MS, RD, LDN Pager # [phone] After hours/ weekend pager # [phone]

## 2018-04-03 NOTE — Progress Notes (Signed)
Occupational Therapy Session Note  Patient Details  Name: Cody Velez MRN: [id number] Date of Birth: 29-Sep-1946  Today's Date: 04/03/2018 OT Individual Time: 0800-0900 OT Individual Time Calculation (min): 60 min    Short Term Goals: Week 1:  OT Short Term Goal 1 (Week 1): Pt will tolerate sitting EOB for 5 mins in preparation for BADL tasks. OT Short Term Goal 2 (Week 1): Pt will direct care to don TLSO with min A from caregiver OT Short Term Goal 3 (Week 1): Pt will maintain sitting balance at EOB with no more than mod A in preparation for BADL tasks OT Short Term Goal 4 (Week 1): Pt will recall 3/3 back precautions without cues  Skilled Therapeutic Interventions/Progress Updates:    OT intervention with focus on bed mobility, sit<>stand with Stedy, sitting balance, and dressing tasks.  Pt donned pants at bed level.  Pt required mod A +2 for bed mobility rolling to R/L and supine>sit EOB.  Pt required tot A for donning TLSO and min A for sitting balance while bathing and dressing UB.  Pt transferred to w/c with Stedy.  Pt required min A for sit<>stand with Stedy. Pt recalled back precautions with min verbal cues.  Pt remained in w/c with daughter present, all needs within reach.   Therapy Documentation Precautions:  Precautions Precautions: Fall, Back Precaution Booklet Issued: No Precaution Comments: Pt needed min cues to recall 3/3 back precautions Required Braces or Orthoses: Spinal Brace Spinal Brace: Thoracolumbosacral orthotic, Applied in sitting position Restrictions Weight Bearing Restrictions: No Pain: Pain Assessment Pain Score: 9  Pain Type: Acute pain Pain Location: Back Pain Descriptors / Indicators: Aching;Discomfort Pain Intervention(s): Medication (See eMAR);Repositioned  See Function Navigator for Current Functional Status.   Therapy/Group: Individual Therapy  [name] 04/03/2018, 9:55 AM

## 2018-04-03 NOTE — Progress Notes (Signed)
Subjective/Complaints: Continues to have back pain, 8/10. Incontinent of urine overnight.  Otherwise feeling ok this morning.   ROS: Patient denies fever, rash, sore throat, blurred vision, nausea, vomiting, diarrhea, cough, shortness of breath or chest pain,   headache, or mood change.   Objective: Vital Signs: Blood pressure 125/62, pulse 85, temperature 98.5 F (36.9 C), temperature source Oral, resp. rate 16, height 6\' 3"  (1.905 m), weight 129 kg (284 lb 8 oz), SpO2 96 %. No results found. No results found for this or any previous visit (from the past 72 hour(s)).   Constitutional: No distress . Vital signs reviewed. HEENT: EOMI, oral membranes moist Cardiovascular: RRR without murmur. No JVD    Respiratory: CTA Bilaterally without wheezes or rales. Normal effort    GI: BS +, non-tender, non-distended  Neuro: Alert and Oriented Sensation diminished to light touch below umbilicus Motor: 5/5 in BUE proximal to distal RLE: hip flexion 2+/5, knee extension 3/5, ankle dorsiflexion 3+ tp 4/5 LLE: Hip flexion, knee extension 1-2/5, ankle dorsiflexion 3 -/5 Musc/Skel:  No edema, back tender Skin: back inicision c/d/i with staples       Assessment/Plan: 1. Functional deficits secondary to paraplegia secondary to epidural abscess which require 3+ hours per day of interdisciplinary therapy in a comprehensive inpatient rehab setting. Physiatrist is providing close team supervision and 24 hour management of active medical problems listed below. Physiatrist and rehab team continue to assess barriers to discharge/monitor patient progress toward functional and medical goals. FIM: Function - Bathing Bathing activity did not occur: Refused Position: Sitting EOB Body parts bathed by patient: Right arm, Left arm, Chest, Abdomen, Right upper leg, Left upper leg Body parts bathed by helper: Buttocks Bathing not applicable: Front perineal area Assist Level: 2 helpers(sit<stand with  Charlaine Dalton)  Function- Upper Body Dressing/Undressing What is the patient wearing?: Pull over shirt/dress, Orthosis Pull over shirt/dress - Perfomed by patient: Thread/unthread right sleeve, Thread/unthread left sleeve, Put head through opening Pull over shirt/dress - Perfomed by helper: Pull shirt over trunk Assist Level: Touching or steadying assistance(Pt > 75%) Function - Lower Body Dressing/Undressing What is the patient wearing?: Pants, Non-skid slipper socks Position: Bed Pants- Performed by helper: Thread/unthread right pants leg, Thread/unthread left pants leg, Pull pants up/down Non-skid slipper socks- Performed by helper: Don/doff right sock, Don/doff left sock Assist for footwear: Dependant Assist for lower body dressing: 2 Helpers  Function - Toileting Toileting activity did not occur: No continent bowel/bladder event Toileting steps completed by helper: Adjust clothing prior to toileting, Performs perineal hygiene, Adjust clothing after toileting Assist level: Touching or steadying assistance (Pt.75%)  Function - Air cabin crew transfer activity did not occur: Safety/medical concerns  Function - Chair/bed transfer Chair/bed transfer activity did not occur: Safety/medical concerns Chair/bed transfer method: Stand pivot Chair/bed transfer assist level: 2 helpers Chair/bed transfer assistive device: Mechanical lift Mechanical lift: Stedy Chair/bed transfer details: Verbal cues for technique, Verbal cues for precautions/safety, Verbal cues for safe use of DME/AE  Function - Locomotion: Ambulation Ambulation activity did not occur: Safety/medical concerns  Function - Comprehension Comprehension: Auditory Comprehension assist level: Follows basic conversation/direction with no assist  Function - Expression Expression: Verbal Expression assist level: Expresses basic needs/ideas: With no assist  Function - Social Interaction Social Interaction assist level:  Interacts appropriately 90% of the time - Needs monitoring or encouragement for participation or interaction.  Function - Problem Solving Problem solving assist level: Solves basic problems with no assist  Function - Memory Memory assist level: Recognizes or  recalls 90% of the time/requires cueing < 10% of the time Patient normally able to recall (first 3 days only): Current season, Location of own room, Staff names and faces, That he or she is in a hospital  Medical Problem List and Plan: 1.Decreased functional mobilitysecondary to T10-11 osteomyelitis/epidural abscess with subsequent myelopathy/paraplegia, status post decompression and fusion T8-L3. Back brace when out of bed. Patient with history of multiple lumbar surgeries as well as spinal cord stimulator January 2019   Continue PT, OT 2. DVT left soleal, right peroneal DVT's:.     prophylactic doses of Lovenox started on 4/12.    Plans for repeat Dopplers ?Wednesday 3. Pain Management:Neurontin 600 mg 3 times daily, Robaxin   as needed   Lidoderm patch started on 4/13   -schedule tylenol  -change to prn oxycodone 10mg ---obsv mental status 4. Mood:Aricept 10 mg nightly, Namenda 10 mg twice daily, Effexor sore 150 mg twice daily, trazodone 100 mg nightly 5. Neuropsych: This patientis?fully capable of making decisions on hisown behalf. 6. Skin/Wound Care:Routine skin checks 7. Fluids/Electrolytes/Nutrition:Routine I/O's  BMP within acceptable range on 4/12, with? Potassium trending down,    -labs pending today   -daughter concerned over weight loss (was in 310's at baseline?)   - Appetite poor.     -have requested RD consult.  8.ID/MSSA bacteremia.Intravenous Ancef 2 g every 8 hours through 05/22/2018 and stop as well as rifampin 300 mg twice daily.    -outpt Follow-up per infectious disease 9.Acute on chronic anemia.     Hemoglobin 7.8 on 4/12   Labs pending for today.  10.Hypertension. Norvasc 10 mg  daily   Relatively controlled on 4/13 11.Hyperlipidemia. Pravachol 12.History of prostate cancer. Proscar 5 mg daily 13.Constipation. Laxative assistance  LOS (Days) 4 A FACE TO FACE EVALUATION WAS PERFORMED  [name] 04/03/2018, 8:52 AM

## 2018-04-03 NOTE — Plan of Care (Signed)
Patient have incontinent episode with bladder

## 2018-04-03 NOTE — Progress Notes (Signed)
Occupational Therapy Session Note  Patient Details  Name: Cody Velez MRN: [id number] Date of Birth: Jan 19, 1946  Today's Date: 04/03/2018 OT Individual Time: 1100-1207 OT Individual Time Calculation (min): 67 min    Short Term Goals: Week 1:  OT Short Term Goal 1 (Week 1): Pt will tolerate sitting EOB for 5 mins in preparation for BADL tasks. OT Short Term Goal 2 (Week 1): Pt will direct care to don TLSO with min A from caregiver OT Short Term Goal 3 (Week 1): Pt will maintain sitting balance at EOB with no more than mod A in preparation for BADL tasks OT Short Term Goal 4 (Week 1): Pt will recall 3/3 back precautions without cues  Skilled Therapeutic Interventions/Progress Updates:    Pt received supine in bed with c/o pain as described below and agreeable to therapy. HOB elevated and pt educated re log rolling technique to transition to sitting EOB. Mod A provided to don TLSO sitting EOB, with pt providing instructions. Charlaine Dalton was used to transfer pt to chair, with mod A provided to perform sit to stand transfer in stedy. Pt propelled w/c 158ft with multiple rest breaks d/t fatigue. Pt completed sit to stand transfer in stedy and participated in table top task sitting in stedy, with education provided re importance of weight bearing through B LE and in OOB mobility on recovery. Pt only able to tolerated about 2 min sitting up in stedy before requesting to sit. Pt very talkative during session and requiring re-direction to task several times. Pt propelled w/c back to room, 110ft with several rest breaks but (S) level overall. Pt complained of worsening fatigue, requiring max A to complete final sit to stand transfer in stedy to return to supine in bed. Min A provided to doff TLSO EOB. Vc provided for UE placement for returning to bed while maintaining spinal precautions.  Therapy Documentation Precautions:  Precautions Precautions: Fall, Back Precaution Booklet Issued: No Precaution  Comments: Pt needed min cues to recall 3/3 back precautions Required Braces or Orthoses: Spinal Brace Spinal Brace: Thoracolumbosacral orthotic, Applied in sitting position Restrictions Weight Bearing Restrictions: No   Pain: Pain Assessment Pain Scale: 0-10 Pain Score: 8  Pain Type: Surgical pain Pain Location: Back Pain Orientation: Mid Pain Descriptors / Indicators: Aching Pain Onset: On-going Pain Intervention(s): Repositioned;RN made aware ADL: ADL ADL Comments: Please see functional navigator  See Function Navigator for Current Functional Status.   Therapy/Group: Individual Therapy  Curtis Sites 04/03/2018, 2:15 PM

## 2018-04-04 ENCOUNTER — Inpatient Hospital Stay (HOSPITAL_COMMUNITY): Payer: Medicare Other | Admitting: Physical Therapy

## 2018-04-04 ENCOUNTER — Encounter (HOSPITAL_COMMUNITY): Payer: Federal, State, Local not specified - PPO

## 2018-04-04 ENCOUNTER — Inpatient Hospital Stay (HOSPITAL_COMMUNITY): Payer: Medicare Other | Admitting: Occupational Therapy

## 2018-04-04 ENCOUNTER — Inpatient Hospital Stay (HOSPITAL_COMMUNITY): Payer: Federal, State, Local not specified - PPO

## 2018-04-04 MED ORDER — POTASSIUM CHLORIDE CRYS ER 20 MEQ PO TBCR
20.0000 meq | EXTENDED_RELEASE_TABLET | Freq: Every day | ORAL | Status: DC
  Administered 2018-04-04 – 2018-05-06 (×33): 20 meq via ORAL
  Filled 2018-04-04 (×33): qty 1

## 2018-04-04 NOTE — Progress Notes (Signed)
Physical Therapy Session Note  Patient Details  Name: Cody Velez MRN: [id number] Date of Birth: 1946-09-05  Today's Date: 04/04/2018 PT Individual Time: 1115-1200 PT Individual Time Calculation (min): 45 min   Short Term Goals: Week 1:  PT Short Term Goal 1 (Week 1): Pt will complete bed mobility with max assist x 1 consistently PT Short Term Goal 2 (Week 1): Pt will tolerate sitting EOB x 10 min with CGA PT Short Term Goal 3 (Week 1): Pt will perform least restrictive transfer with max assist x 1 PT Short Term Goal 4 (Week 1): Pt will propel manual w/c x 50 ft with CGA  Skilled Therapeutic Interventions/Progress Updates:   Pt in supine and agreeable to therapy, reports 7/10 pain as detailed below. Pt transferred to EOB w/ min assist for LE management and maintained static sitting at EOB while therapist donned TLSO total assist for time management. Session focused on functional mobility including sit<>stands, standing posture, and w/c mobility. Transferred to w/c via stedy w/ 2 helpers for safety, min assist to power into standing from EOB. Pt reports burning sensation in mid R lateral back with standing that does not immediately resolve w/ rest - made PA aware as pt reports this is new today and it also happened this morning when he stood. Verbal and tactile cues to adjust posture in w/c for safety as pt was slouched. Pt self-propelled w/c to therapy gym w/ supervision using BUEs. Practiced sit<>stands x2 reps in stedy ranging from mod-total assist x2. Verbal cues for scooting forward, technique to use 1 UE on armrest to power up, and manual assist at bottom and trunk to boost into standing. During first stand, pt required multiple attempts before successful. Tactile and verbal cues for posture in standing, able to tolerate 5-10 sec of standing only before needing to sit in stedy. 2nd helper at LEs in stedy to maintain neutral alignment, added pillow between knees and stedy for improved  alignment, helped minimally. Additionally added hard back to w/c for improved sitting posture. Pt self-propelled w/c towards room, however reports increased fatigued after 50' and requesting therapist to push rest of way. Ended session in w/c, call bell within reach and all needs met. Quick release belt donned.   Therapy Documentation Precautions:  Precautions Precautions: Fall, Back Precaution Booklet Issued: No Precaution Comments: Pt needed min cues to recall 3/3 back precautions Required Braces or Orthoses: Spinal Brace Spinal Brace: Thoracolumbosacral orthotic, Applied in sitting position Restrictions Weight Bearing Restrictions: No Pain: Pain Assessment Pain Scale: 0-10 Pain Score: 8  Pain Type: Acute pain;Surgical pain Pain Location: Back Pain Orientation: Mid Pain Descriptors / Indicators: Aching;Discomfort Pain Frequency: Intermittent Pain Onset: Gradual Pain Intervention(s): Medication (See eMAR)  See Function Navigator for Current Functional Status.   Therapy/Group: Individual Therapy  Imaan Padgett K Arnette 04/04/2018, 12:04 PM

## 2018-04-04 NOTE — Progress Notes (Addendum)
Occupational Therapy Session Note  Patient Details  Name: Cody Velez MRN: [id number] Date of Birth: 1946/07/10  Today's Date: 04/04/2018 OT Individual Time: 0950-1030 OT Individual Time Calculation (min): 40 min   OT Missed time: 20 min   Short Term Goals: Week 1:  OT Short Term Goal 1 (Week 1): Pt will tolerate sitting EOB for 5 mins in preparation for BADL tasks. OT Short Term Goal 2 (Week 1): Pt will direct care to don TLSO with min A from caregiver OT Short Term Goal 3 (Week 1): Pt will maintain sitting balance at EOB with no more than mod A in preparation for BADL tasks OT Short Term Goal 4 (Week 1): Pt will recall 3/3 back precautions without cues  Skilled Therapeutic Interventions/Progress Updates:    Upon entering room, surgeon present to dress wound. Surgeon requested OT hold until dressing complete. OT re-entered room at 9:50 to begin session. Pt required 2 vc to recall spinal precautions. Session focused on sitting balance, ADL transfers, and functional activity tolerance. Fluctuating Min-mod A provided to maintain sitting balance EOB without UE support during UB bathing and dressing. Pt educated re dressing techniques with spinal precautions and the use of AE to assist. Mod A provided for donning TLSO at EOB. Pt completed 5x sit to stand transfers in stedy, fluctuating between min-mod A with progressive fatigue levels. Pt completed anterior peri-care sitting in stedy, but was unable to remove UE from stedy to complete posterior peri-care in standing. Pt returned to supine with all needs met.   Therapy Documentation Precautions:  Precautions Precautions: Fall, Back Precaution Booklet Issued: No Precaution Comments: Pt needed min cues to recall 3/3 back precautions Required Braces or Orthoses: Spinal Brace Spinal Brace: Thoracolumbosacral orthotic, Applied in sitting position Restrictions Weight Bearing Restrictions: No   Pain: Pain Assessment Pain Scale: 0-10 Pain  Score: 8  Pain Location: Back Pain Orientation: Mid Pain Descriptors / Indicators: Aching Pain Intervention(s): Repositioned ADL: ADL ADL Comments: Please see functional navigator  See Function Navigator for Current Functional Status.   Therapy/Group: Individual Therapy  Curtis Sites 04/04/2018, 12:26 PM

## 2018-04-04 NOTE — Care Management (Signed)
[facility] Individual Statement of Services  Patient Name:  Cody Velez  Date:  04/03/2018  Welcome to the [facility].  Our goal is to provide you with an individualized program based on your diagnosis and situation, designed to meet your specific needs.  With this comprehensive rehabilitation program, you will be expected to participate in at least 3 hours of rehabilitation therapies Monday-Friday, with modified therapy programming on the weekends.  Your rehabilitation program will include the following services:  Physical Therapy (PT), Occupational Therapy (OT), 24 hour per day rehabilitation nursing, Therapeutic Recreaction (TR), Neuropsychology, Case Management (Social Worker), Rehabilitation Medicine, Nutrition Services and Pharmacy Services  Weekly team conferences will be held on Tuesdays to discuss your progress.  Your Social Worker will talk with you frequently to get your input and to update you on team discussions.  Team conferences with you and your family in attendance may also be held.  Expected length of stay: 21-28 days    Overall anticipated outcome: minimal assistance at wheelchair level  Depending on your progress and recovery, your program may change. Your Social Worker will coordinate services and will keep you informed of any changes. Your Social Worker's name and contact numbers are listed  below.  The following services may also be recommended but are not provided by the [facility] will be made to provide these services after discharge if needed.  Arrangements include referral to agencies that provide these services.  Your insurance has been verified to be:  Medicare and [employer] Your primary doctor is:  Dr. Willey Blade  Pertinent information will be shared with your doctor and your  insurance company.  Social Worker:  [name], [place] or ([phone]   Information discussed with and copy given to patient by: Lennart Pall, 04/03/2018, 9:22 AM

## 2018-04-04 NOTE — Progress Notes (Signed)
Occupational Therapy Session Note  Patient Details  Name: Cody Velez MRN: [id number] Date of Birth: [birth year]  Today's Date: 04/04/2018 OT Individual Time: 0973-5329 OT Individual Time Calculation (min): 30 min    Short Term Goals: Week 1:  OT Short Term Goal 1 (Week 1): Pt will tolerate sitting EOB for 5 mins in preparation for BADL tasks. OT Short Term Goal 2 (Week 1): Pt will direct care to don TLSO with min A from caregiver OT Short Term Goal 3 (Week 1): Pt will maintain sitting balance at EOB with no more than mod A in preparation for BADL tasks OT Short Term Goal 4 (Week 1): Pt will recall 3/3 back precautions without cues  Skilled Therapeutic Interventions/Progress Updates:    Treatment session with focus on BUE strengthening and sitting balance.  Pt received in hand off from PT.  Pt engaged in BUE strengthening on arm bike for 8 mins alternating 2 mins forward then 2 mins backward on resistance level 1 for endurance.  Pt transferred back to bed with use of Stedy and +2 for safety.  Doffed TLSO with assist from therapist.  Pt maintained sitting balance EOB for 3 mins with supervision.  Returned to bed with +2 to manage trunk and BLE.  Therapy Documentation Precautions:  Precautions Precautions: Fall, Back Precaution Booklet Issued: No Precaution Comments: Pt needed min cues to recall 3/3 back precautions Required Braces or Orthoses: Spinal Brace Spinal Brace: Thoracolumbosacral orthotic, Applied in sitting position Restrictions Weight Bearing Restrictions: No Pain: Pain Assessment Pain Scale: 0-10 Pain Score: 8  Pain Location: Back Pain Orientation: Mid Pain Descriptors / Indicators: Aching Pain Intervention(s): Repositioned  See Function Navigator for Current Functional Status.   Therapy/Group: Individual Therapy  Simonne Come 04/04/2018, 4:08 PM

## 2018-04-04 NOTE — Progress Notes (Signed)
Pharmacy Antibiotic Note  Cody Velez is a 72 y.o. male admitted on 03/30/2018 with MSSA bacteremia and extensive epidural abscess discitis.  Pharmacy has been monitoring cefazolin dosing with plans to continue until June 3rd.  Renal function has been stable, scr 0.81. WBC is within normal limits and patient remains afebrile.  Ancef dose appropriate and stable.  Plan: Continue cefazolin 2gm IV every 8 hours for 8 weeks total to end on 05/22/2018 Rifampin 300mg  PO q12h  Monitor labs and clinical progression  Pharmacy will sign off ancef consult for now. Will still monitor renal fx adjust if needed.  OPAT completed, prior to CIR, f/u discharge date, might need to redo it when discharge from CIR.   Height: 6\' 3"  (190.5 cm) Weight: 289 lb 0.4 oz (131.1 kg) IBW/kg (Calculated) : 84.5  Temp (24hrs), Avg:98.8 F (37.1 C), Min:98.8 F (37.1 C), Max:98.8 F (37.1 C)  Recent Labs  Lab 03/28/18 1723 03/29/18 0439 03/31/18 0821 04/03/18 1040  WBC 15.9* 12.8* 7.9 7.4  CREATININE 1.09 1.03 0.80 0.81    Estimated Creatinine Clearance: 122 mL/min (by C-G formula based on SCr of 0.81 mg/dL).    No Known Allergies  Antimicrobials this admission: Vancomycin 3/28 >> 3/30 Zosyn 3/28 >> 3/30 Ancef 3/30 >> 6/3 Rifampin 4/11>> 6/3     Microbiology results: 3/30 Repeat BCx: ngf 3/27 BCx: 1 or 2 = MSSA 3/27 UCx: insignificant growth   Maryanna Shape, PharmD, BCPS  Clinical Pharmacist  Pager: [place]: [phone]

## 2018-04-04 NOTE — Progress Notes (Signed)
Subjective/Complaints: Back pain an issue. Continues to try to work thru. +urine retention at times  ROS: Patient denies fever, rash, sore throat, blurred vision, nausea, vomiting, diarrhea, cough, shortness of breath or chest pain, , headache, or mood change.   Objective: Vital Signs: Blood pressure 113/61, pulse 91, temperature 98.8 F (37.1 C), temperature source Oral, resp. rate 17, height '6\' 3"'  (1.905 m), weight 131.1 kg (289 lb 0.4 oz), SpO2 95 %. Dg Thoracolumabar Spine  Result Date: 04/03/2018 CLINICAL DATA:  72 year old male with postoperative pain after undergoing multilevel thoracolumbar fusion 6 days previously EXAM: THORACOLUMBAR SPINE 1V COMPARISON:  Intraoperative radiographs 03/28/2018 FINDINGS: Postsurgical changes of T8-L3 posterior thoraco lumbar interbody fusion. High attenuation material within the T8, T9 and T12 vertebral bodies suggests associated cement augmentation. Interbody grafts are present at L1-L2, L2-L3 and L3-L4. Interbody cages are present at L4-L5 and L5-S1. No evidence of hardware fracture or malalignment. Incompletely imaged epidural spinal stimulator. The bowel gas pattern does not appear obstructed. IMPRESSION: Postsurgical changes as described above without evidence of hardware fracture or complication. Electronically Signed   By: Jacqulynn Cadet M.D.   On: 04/03/2018 20:56   Results for orders placed or performed during the hospital encounter of 03/30/18 (from the past 72 hour(s))  Basic metabolic panel     Status: Abnormal   Collection Time: 04/03/18 10:40 AM  Result Value Ref Range   Sodium 137 135 - 145 mmol/L   Potassium 3.4 (L) 3.5 - 5.1 mmol/L   Chloride 100 (L) 101 - 111 mmol/L   CO2 28 22 - 32 mmol/L   Glucose, Bld 102 (H) 65 - 99 mg/dL   BUN 8 6 - 20 mg/dL   Creatinine, Ser 0.81 0.61 - 1.24 mg/dL   Calcium 8.5 (L) 8.9 - 10.3 mg/dL   GFR calc non Af Amer >60 >60 mL/min   GFR calc Af Amer >60 >60 mL/min    Comment: (NOTE) The eGFR has  been calculated using the CKD EPI equation. This calculation has not been validated in all clinical situations. eGFR's persistently <60 mL/min signify possible Chronic Kidney Disease.    Anion gap 9 5 - 15    Comment: Performed at [facility] [address] [place], [place] [address]  CBC with Differential/Platelet     Status: Abnormal   Collection Time: 04/03/18 10:40 AM  Result Value Ref Range   WBC 7.4 4.0 - 10.5 K/uL   RBC 3.05 (L) 4.22 - 5.81 MIL/uL   Hemoglobin 8.0 (L) 13.0 - 17.0 g/dL   HCT 26.6 (L) 39.0 - 52.0 %   MCV 87.2 78.0 - 100.0 fL   MCH 26.2 26.0 - 34.0 pg   MCHC 30.1 30.0 - 36.0 g/dL   RDW 17.3 (H) 11.5 - 15.5 %   Platelets 579 (H) 150 - 400 K/uL   Neutrophils Relative % 68 %   Neutro Abs 5.0 1.7 - 7.7 K/uL   Lymphocytes Relative 19 %   Lymphs Abs 1.4 0.7 - 4.0 K/uL   Monocytes Relative 10 %   Monocytes Absolute 0.7 0.1 - 1.0 K/uL   Eosinophils Relative 3 %   Eosinophils Absolute 0.3 0.0 - 0.7 K/uL   Basophils Relative 0 %   Basophils Absolute 0.0 0.0 - 0.1 K/uL    Comment: Performed at [facility] [address], [place], [place] [address]     Constitutional: No distress . Vital signs reviewed. obese HEENT: EOMI, oral membranes moist Cardiovascular: RRR without  murmur. No JVD    Respiratory: CTA Bilaterally without wheezes or rales. Normal effort    GI: BS +, non-tender, non-distended  Neuro: Alert and Oriented Sensation diminished to light touch below umbilicus Motor: 5/5 in BUE proximal to distal RLE: hip flexion 2+/5, knee extension 3/5, ankle dorsiflexion 3+ tp 4/5 LLE: Hip flexion, knee extension 1-2/5, ankle dorsiflexion 3 -/5--neuro unchanged Musc/Skel:  No edema, back tender Skin: back inicision c/d/i with staples--stable       Assessment/Plan: 1. Functional deficits secondary to paraplegia secondary to epidural abscess which require 3+ hours per day of interdisciplinary therapy in a comprehensive inpatient rehab  setting. Physiatrist is providing close team supervision and 24 hour management of active medical problems listed below. Physiatrist and rehab team continue to assess barriers to discharge/monitor patient progress toward functional and medical goals. FIM: Function - Bathing Bathing activity did not occur: Refused Position: Bed(LB at bed level, UB sitting EOB) Body parts bathed by patient: Right arm, Left arm, Chest, Abdomen Body parts bathed by helper: Buttocks, Right upper leg, Left upper leg, Right lower leg, Left lower leg, Front perineal area Bathing not applicable: Front perineal area Assist Level: 2 helpers  Function- Engineer, petroleum What is the patient wearing?: Pull over shirt/dress, Orthosis Pull over shirt/dress - Perfomed by patient: Thread/unthread right sleeve, Thread/unthread left sleeve, Put head through opening Pull over shirt/dress - Perfomed by helper: Pull shirt over trunk Assist Level: 2 helpers Function - Lower Body Dressing/Undressing What is the patient wearing?: Pants, Non-skid slipper socks Position: Bed Pants- Performed by helper: Thread/unthread right pants leg, Thread/unthread left pants leg, Pull pants up/down Non-skid slipper socks- Performed by helper: Don/doff right sock, Don/doff left sock Assist for footwear: Dependant Assist for lower body dressing: 2 Helpers  Function - Toileting Toileting steps completed by helper: Adjust clothing prior to toileting, Performs perineal hygiene, Adjust clothing after toileting Assist level: Touching or steadying assistance (Pt.75%)  Function - Air cabin crew transfer activity did not occur: Safety/medical concerns  Function - Chair/bed transfer Chair/bed transfer activity did not occur: Safety/medical concerns Chair/bed transfer method: Lateral scoot Chair/bed transfer assist level: 2 helpers Chair/bed transfer assistive device: Sliding board Mechanical lift: Stedy Chair/bed transfer  details: Verbal cues for technique, Verbal cues for precautions/safety, Verbal cues for safe use of DME/AE, Manual facilitation for placement, Manual facilitation for weight shifting  Function - Locomotion: Wheelchair Will patient use wheelchair at discharge?: Yes Type: Manual Max wheelchair distance: 100 Assist Level: Supervision or verbal cues Assist Level: Supervision or verbal cues Turns around,maneuvers to table,bed, and toilet,negotiates 3% grade,maneuvers on rugs and over doorsills: No Function - Locomotion: Ambulation Ambulation activity did not occur: Safety/medical concerns  Function - Comprehension Comprehension: Auditory Comprehension assist level: Follows basic conversation/direction with no assist  Function - Expression Expression: Verbal Expression assist level: Expresses basic needs/ideas: With no assist  Function - Social Interaction Social Interaction assist level: Interacts appropriately 90% of the time - Needs monitoring or encouragement for participation or interaction.  Function - Problem Solving Problem solving assist level: Solves basic problems with no assist  Function - Memory Memory assist level: Recognizes or recalls 90% of the time/requires cueing < 10% of the time Patient normally able to recall (first 3 days only): Current season, Location of own room, Staff names and faces, That he or she is in a hospital  Medical Problem List and Plan: 1.Decreased functional mobilitysecondary to T10-11 osteomyelitis/epidural abscess with subsequent myelopathy/paraplegia, status post decompression and fusion T8-L3. Back brace  when out of bed. Patient with history of multiple lumbar surgeries as well as spinal cord stimulator January 2019   Continue PT, OT, team conf today 2. DVT left soleal, right peroneal DVT's:.     prophylactic doses of Lovenox started on 4/12.    Would repeat dopplers next week, not tomorrow 3. Pain Management:Neurontin 600 mg 3 times  daily, Robaxin   as needed   Lidoderm patch started on 4/13   -scheduled tylenol  -oxycodone prn. Discussed balancing pain with mental status  -xrays of back reviewed and stable 4. Mood:Aricept 10 mg nightly, Namenda 10 mg twice daily, Effexor sore 150 mg twice daily, trazodone 100 mg nightly 5. Neuropsych: This patientis?fully capable of making decisions on hisown behalf. 6. Skin/Wound Care:Routine skin checks 7. Fluids/Electrolytes/Nutrition:Routine I/O's  BMP reviewed. Potassium now a little low, replete   -daughter concerned over weight loss (was in 310's at baseline?)   - Appetite poor.     -appreciate RD consult.  8.ID/MSSA bacteremia.Intravenous Ancef 2 g every 8 hours through 05/22/2018 and stop as well as rifampin 300 mg twice daily.    -outpt Follow-up per infectious disease 9.Acute on chronic anemia.     Hemoglobin 8 on 4/15   Continue to follow.  10.Hypertension. Norvasc 10 mg daily   Relatively controlled on 4/13 11.Hyperlipidemia. Pravachol 12.History of prostate cancer. Proscar 5 mg daily 13.Constipation. Laxative assistance, +bm  LOS (Days) 5 A FACE TO FACE EVALUATION WAS PERFORMED  [name] 04/04/2018, 8:40 AM

## 2018-04-04 NOTE — Progress Notes (Signed)
Physical Therapy Session Note  Patient Details  Name: Cody Velez MRN: [id number] Date of Birth: 1946/05/25  Today's Date: 04/04/2018 PT Individual Time: 8177-1165 PT Individual Time Calculation (min): 60 min   Short Term Goals: Week 1:  PT Short Term Goal 1 (Week 1): Pt will complete bed mobility with max assist x 1 consistently PT Short Term Goal 2 (Week 1): Pt will tolerate sitting EOB x 10 min with CGA PT Short Term Goal 3 (Week 1): Pt will perform least restrictive transfer with max assist x 1 PT Short Term Goal 4 (Week 1): Pt will propel manual w/c x 50 ft with CGA  Skilled Therapeutic Interventions/Progress Updates:  Pt received in bed & agreeable to tx. Pt reports 8/10 back pain (burning on R side like he's pulled a muscle) but notes being premedicated. Provided pt with 20x18 geri-w/c for increased comfort and to promote OOB tolerance. Pt is able to recall 2/3 back precautions with therapist educating him on third. Pt requires max cuing for log rolling and mod assist overall for bed mobility as pt with difficulty completely positioning BLE in bed. Therapist provides total assist for donning shorts and pt transfers sit>stand in stedy from elevated bed with min assist +1. Pt transferred to w/c with stedy lift and was able to scoot hips back in w/c without assistance. Pt attempted to propel w/c but reports increased hand pain 2/2 no drive wheel on L side and no w/c gloves available at this time. In gym pt performed BLE long arc quads with 3# ankle weights and attempted hip flexion but unable 2/2 weakness. Pt instructed on & performed hip adduction ball squeezes and abduction with orange theraband. At end of session pt left in handoff to OT.  Therapy Documentation Precautions:  Precautions Precautions: Fall, Back Precaution Booklet Issued: No Precaution Comments: Pt needed min cues to recall 3/3 back precautions Required Braces or Orthoses: Spinal Brace Spinal Brace:  Thoracolumbosacral orthotic, Applied in sitting position Restrictions Weight Bearing Restrictions: No   See Function Navigator for Current Functional Status.   Therapy/Group: Individual Therapy  Waunita Schooner 04/04/2018, 4:21 PM

## 2018-04-05 ENCOUNTER — Inpatient Hospital Stay (HOSPITAL_COMMUNITY): Payer: Federal, State, Local not specified - PPO | Admitting: *Deleted

## 2018-04-05 ENCOUNTER — Inpatient Hospital Stay (HOSPITAL_COMMUNITY): Payer: Federal, State, Local not specified - PPO | Admitting: Physical Therapy

## 2018-04-05 ENCOUNTER — Inpatient Hospital Stay (HOSPITAL_COMMUNITY): Payer: Federal, State, Local not specified - PPO

## 2018-04-05 LAB — URINALYSIS, COMPLETE (UACMP) WITH MICROSCOPIC
Bacteria, UA: NONE SEEN
Glucose, UA: NEGATIVE mg/dL
Hgb urine dipstick: NEGATIVE
Ketones, ur: NEGATIVE mg/dL
Leukocytes, UA: NEGATIVE
Nitrite: NEGATIVE
Protein, ur: 30 mg/dL — AB
Specific Gravity, Urine: 1.029 (ref 1.005–1.030)
pH: 5 (ref 5.0–8.0)

## 2018-04-05 NOTE — Progress Notes (Signed)
Occupational Therapy Note  Patient Details  Name: Cody Velez MRN: [id number] Date of Birth: 01-Jan-1946  Today's Date: 04/05/2018 OT Individual Time: 1345-1430 OT Individual Time Calculation (min): 45 min   Pt c/o 7/10 pain in back; RN aware Individual Therapy  Pt resting in bed upon arrival and agreeable to participating in therapy.  Pt required assistance with moving BLE off EOB but was able to go from sidelying to sitting EOB with steady A.  Pt transferred to w/c with Stedy; sit<>stand in [place] with steady A.  Pt engaged in table activities while standing/sitting in Stedy to increase weight bearing through BLE and increase independence with transfers/BADLs. Pt returned to room and remained in w/c with all needs within reach.    Leotis Shames [facility] 04/05/2018, 2:59 PM

## 2018-04-05 NOTE — Progress Notes (Signed)
Patient had an assisted fall with nurse and nurse tech. PA notified and no additional orders at this time. Patient vital signs are stable.

## 2018-04-05 NOTE — Progress Notes (Signed)
Social Work Patient ID: Cody Velez, male   DOB: 1946/12/16, 72 y.o.   MRN: [id number]   Have reviewed team conference with pt and left VM for daughter, [name].  Pt aware and agreeable with targeted d/c date of 5/18 and min assist w/c level goals.  He understands that ambulation goals are TBD and are likely to be very limited distance. He reports that his sons-in-law are ready to build ramp and make any necessary home modifications needed.  He is very motivated for therapies and optimistic.  Aletha Allebach, LCSW

## 2018-04-05 NOTE — Progress Notes (Signed)
Occupational Therapy Session Note  Patient Details  Name: Cody Velez MRN: [id number] Date of Birth: May 18, 1946  Today's Date: 04/05/2018 OT Individual Time: 0830-1000 OT Individual Time Calculation (min): 90 min    Short Term Goals: Week 1:  OT Short Term Goal 1 (Week 1): Pt will tolerate sitting EOB for 5 mins in preparation for BADL tasks. OT Short Term Goal 2 (Week 1): Pt will direct care to don TLSO with min A from caregiver OT Short Term Goal 3 (Week 1): Pt will maintain sitting balance at EOB with no more than mod A in preparation for BADL tasks OT Short Term Goal 4 (Week 1): Pt will recall 3/3 back precautions without cues  Skilled Therapeutic Interventions/Progress Updates:    OT intervention with focus on BADL retraining, bed mobility, sitting balance, sit<>stand, activity tolerance, and sit<>stand to increase independence with BADLs. Pt completed LB bathing/dressing tasks at bed level requiring assistance with B lower legs and buttocks.  Pt required assistance donning pants.  Pt required assistance moving BLE off EOB and min A sidelying>sitting EOB.  Pt required min A for sitting balance EOB to complete UB bathing/dressing tasks.  Pt required assistance with donning TLSO. Transfer to w/c with Stedy requring min A for sit<>stand in [place].  Pt transitioned to gym and engaged in table activities while standing and sitting on Stedy.  Pt relies primarily on BUE for support when standing.  Pt returned to room and remained in w/c with all needs within reach.   Therapy Documentation Precautions:  Precautions Precautions: Fall, Back Precaution Booklet Issued: No Precaution Comments: Pt needed min cues to recall 3/3 back precautions Required Braces or Orthoses: Spinal Brace Spinal Brace: Thoracolumbosacral orthotic, Applied in sitting position Restrictions Weight Bearing Restrictions: No General:   Vital Signs: Therapy Vitals Temp: 97.6 F (36.4 C) Temp Source: Oral Pulse  Rate: 82 Resp: 17 BP: 127/75 Patient Position (if appropriate): Lying Oxygen Therapy SpO2: 98 % O2 Device: Room Air Pain:   ADL: ADL ADL Comments: Please see functional navigator Vision   Perception    Praxis   Exercises:   Other Treatments:    See Function Navigator for Current Functional Status.   Therapy/Group: Individual Therapy  [name] 04/05/2018, 12:04 PM

## 2018-04-05 NOTE — Patient Care Conference (Signed)
Inpatient RehabilitationTeam Conference and Plan of Care Update Date: 04/04/2018   Time: 2:00 PM    Patient Name: Cody Velez      Medical Record Number: [id number]  Date of Birth: [birth year] Sex: Male         Room/Bed: 4W06C/4W06C-01 Payor Info: Payor: [place] / Plan: [employer] PPO / Product Type: *No Product type* /    Admitting Diagnosis: T 10  11 Myelopathy  Admit Date/Time:  03/30/2018  3:13 PM Admission Comments: No comment available   Primary Diagnosis:  Paraplegia ([place]) Principal Problem: Paraplegia [facility])  Patient Active Problem List   Diagnosis Date Noted  . Postoperative pain   . DVT, lower extremity, distal, acute, bilateral ([place])   . Acute blood loss anemia   . Anemia of chronic disease   . Essential hypertension   . Bacteremia   . Myelopathy ([place]) 03/30/2018  . Paraplegia ([place])   . Postlaminectomy syndrome, lumbar region   . Epidural abscess   . Abdominal distension   . Encephalopathy   . Weakness of both lower extremities   . Discitis of thoracic region   . Spinal cord stimulator status   . Staphylococcus aureus sepsis ([place]) 03/20/2018  . Bacteremia due to methicillin susceptible Staphylococcus aureus (MSSA) 03/18/2018  . Obesity 03/18/2018  . Weakness 03/16/2018  . Fever 03/16/2018  . Sepsis ([place]) 03/16/2018  . Nephrolithiasis 03/16/2018  . AKI (acute kidney injury) ([place]) 03/16/2018  . Acute metabolic encephalopathy 62/83/1517  . Cognitive impairment 03/16/2018  . Chronic pain syndrome 03/16/2018  . B12 deficiency 01/28/2016  . Memory difficulty 07/23/2015  . History of cerebrovascular disease 07/23/2015  . FH: colon cancer 08/13/2014  . Hx of adenomatous colonic polyps 08/13/2014  . Chronic back pain 05/30/2012  . CAD, NATIVE VESSEL 07/17/2010  . HYPERCHOLESTEROLEMIA 10/31/2009  . SMOKELESS TOBACCO ABUSE 10/31/2009  . Obstructive sleep apnea 10/31/2009  . Essential hypertension, benign 10/31/2009  . GERD 10/31/2009     Expected Discharge Date: Expected Discharge Date: 05/06/18  Team Members Present: Physician leading conference: Dr. Alger Simons Social Worker Present: Lennart Pall, LCSW Nurse Present: Frances Maywood, RN PT Present: Dwyane Dee, PT OT Present: Napoleon Form, OT PPS Coordinator present : Daiva Nakayama, RN, CRRN     Current Status/Progress Goal Weekly Team Focus  Medical   lumbar spondylosis with myelopathy, s/p lami/fusion. ABLA, pain mgt, neurogenic bladder  improve activity tolerance  pain, anemia, nutritional status   Bowel/Bladder   Continent of bowel, continent/incontinent with bladder. LBM 04/03/18  To be continent of bowel/dladder with mod I  Timed toileting q2   Swallow/Nutrition/ Hydration             ADL's   tot/max A LB bathing/dressing; min A UB bathing dressing; trasnfers with Stedy; mod A for sit<>stand with Stedy  min A overall  bed mobility, sitting balance, sit<>stand, functional transfers, activity tolerance   Mobility   mod to max assist +2 for transfers with [facility] or sliding board; supervision w/c propulsion; min to mod assist sitting balance; limited standing tolerance with lift equipment  min assist w/c level transfers; mod I w/c propulsion; mod assist sstanding balance and car transfer goal  transfers, strengthening, trunk control and balance retraining, endurance, bed mobility, sit <> stands and standing [place]   Communication             Safety/Cognition/ Behavioral Observations            Pain   Complaine of back pain,  Tylenol scheduled, Oxy PRN  <2  assess and treat pain q shift and as needed   Skin   Surgical incision to the back with dry dsg, Stage II to the butt with foam dsg.  Prevent further skin breakdown with min assist  Repositioning q2, assess skin q shift and as needed    Rehab Goals Patient on target to meet rehab goals: Yes *See Care Plan and progress notes for long and short-term goals.     Barriers to Discharge  Current  Status/Progress Possible Resolutions Date Resolved   Physician    Medical stability;Weight        continued pain mgt/adjustment      Nursing                  PT  Inaccessible home environment;Neurogenic Bowel & Bladder;Decreased caregiver support  steps to enter home; chart states planning for ramp              OT                  SLP                SW                Discharge Planning/Teaching Needs:  Pt to d/c home with girlfriend who can provide light physical assistance.  Daughter are available for intermittent support.  Teaching to be planned closer to d/c.   Team Discussion:  New pt here s/p back surgery due to severe spinal stenosis;  Recent xrays look fine per Dr. Ellene Route.  Pain manage and pt tries to work through it as much as he can.  Currently max - total assist with b/d.  Mod assist +2 with STEDY.  Goals being set for min assist at w/c level.  Ambulation goals TBD.  Revisions to Treatment Plan:  None    Continued Need for Acute Rehabilitation Level of Care: The patient requires daily medical management by a physician with specialized training in physical medicine and rehabilitation for the following conditions: Daily direction of a multidisciplinary physical rehabilitation program to ensure safe treatment while eliciting the highest outcome that is of practical value to the patient.: Yes Daily medical management of patient stability for increased activity during participation in an intensive rehabilitation regime.: Yes Daily analysis of laboratory values and/or radiology reports with any subsequent need for medication adjustment of medical intervention for : Neurological problems;Post surgical problems  Nava Song 04/05/2018, 9:54 AM

## 2018-04-05 NOTE — Progress Notes (Signed)
Physical Therapy Session Note  Patient Details  Name: Cody Velez MRN: [id number] Date of Birth: May 11, 1946  Today's Date: 04/05/2018 PT Individual Time: 1100-1200 PT Individual Time Calculation (min): 60 min   Short Term Goals: Week 1:  PT Short Term Goal 1 (Week 1): Pt will complete bed mobility with max assist x 1 consistently PT Short Term Goal 2 (Week 1): Pt will tolerate sitting EOB x 10 min with CGA PT Short Term Goal 3 (Week 1): Pt will perform least restrictive transfer with max assist x 1 PT Short Term Goal 4 (Week 1): Pt will propel manual w/c x 50 ft with CGA  Skilled Therapeutic Interventions/Progress Updates:    Pt supine in bed, agreeable to PT. Pt reports pain in low back, not rated. Supine to sit mod assist for LE management, use of bedrails and HOB elevated. Pt is dependent to don TLSO with close SBA for sitting balance EOB. Sit to stand with min assist to stedy from elevated bed. Stedy transfer bed to w/c. Sitting BP 103/67, HR 94. Standing frame 2 x 6 min with verbal and manual cues for upright posture, due to low back pain pt tends to stand with hip and trunk flexion for improved comfort. Standing BP 103/81 during first bout, 90/60 during second but pt remains asymptomatic. Stedy transfer back to bed assist x 2. Sit to supine mod assist for LE management. Supine BLE therex x 10-15 reps, AAROM L>R. Pt left supine in bed with needs in reach and bed alarm in place.  Therapy Documentation Precautions:  Precautions Precautions: Fall, Back Precaution Booklet Issued: No Precaution Comments: Pt needed min cues to recall 3/3 back precautions Required Braces or Orthoses: Spinal Brace Spinal Brace: Thoracolumbosacral orthotic, Applied in sitting position Restrictions Weight Bearing Restrictions: No  See Function Navigator for Current Functional Status.   Therapy/Group: Individual Therapy  Excell Seltzer, PT, DPT  04/05/2018, 12:09 PM

## 2018-04-05 NOTE — Progress Notes (Signed)
Placed patient on home CPAP. Distilled H20 added to chamber for humidity.

## 2018-04-05 NOTE — Progress Notes (Signed)
Subjective/Complaints: Still with some urine retention. +odor. Pain seems controlled  ROS: Patient denies fever, rash, sore throat, blurred vision, nausea, vomiting, diarrhea, cough, shortness of breath or chest pain, joint or back pain, headache, or mood change.   Objective: Vital Signs: Blood pressure 132/83, pulse 86, temperature 98.5 F (36.9 C), temperature source Oral, resp. rate 18, height '6\' 3"'  (1.905 m), weight 129.9 kg (286 lb 6 oz), SpO2 94 %. Dg Thoracolumabar Spine  Result Date: 04/03/2018 CLINICAL DATA:  72 year old male with postoperative pain after undergoing multilevel thoracolumbar fusion 6 days previously EXAM: THORACOLUMBAR SPINE 1V COMPARISON:  Intraoperative radiographs 03/28/2018 FINDINGS: Postsurgical changes of T8-L3 posterior thoraco lumbar interbody fusion. High attenuation material within the T8, T9 and T12 vertebral bodies suggests associated cement augmentation. Interbody grafts are present at L1-L2, L2-L3 and L3-L4. Interbody cages are present at L4-L5 and L5-S1. No evidence of hardware fracture or malalignment. Incompletely imaged epidural spinal stimulator. The bowel gas pattern does not appear obstructed. IMPRESSION: Postsurgical changes as described above without evidence of hardware fracture or complication. Electronically Signed   By: Jacqulynn Cadet M.D.   On: 04/03/2018 20:56   Results for orders placed or performed during the hospital encounter of 03/30/18 (from the past 72 hour(s))  Basic metabolic panel     Status: Abnormal   Collection Time: 04/03/18 10:40 AM  Result Value Ref Range   Sodium 137 135 - 145 mmol/L   Potassium 3.4 (L) 3.5 - 5.1 mmol/L   Chloride 100 (L) 101 - 111 mmol/L   CO2 28 22 - 32 mmol/L   Glucose, Bld 102 (H) 65 - 99 mg/dL   BUN 8 6 - 20 mg/dL   Creatinine, Ser 0.81 0.61 - 1.24 mg/dL   Calcium 8.5 (L) 8.9 - 10.3 mg/dL   GFR calc non Af Amer >60 >60 mL/min   GFR calc Af Amer >60 >60 mL/min    Comment: (NOTE) The eGFR has  been calculated using the CKD EPI equation. This calculation has not been validated in all clinical situations. eGFR's persistently <60 mL/min signify possible Chronic Kidney Disease.    Anion gap 9 5 - 15    Comment: Performed at [facility] [address], [place], [place] [address]  CBC with Differential/Platelet     Status: Abnormal   Collection Time: 04/03/18 10:40 AM  Result Value Ref Range   WBC 7.4 4.0 - 10.5 K/uL   RBC 3.05 (L) 4.22 - 5.81 MIL/uL   Hemoglobin 8.0 (L) 13.0 - 17.0 g/dL   HCT 26.6 (L) 39.0 - 52.0 %   MCV 87.2 78.0 - 100.0 fL   MCH 26.2 26.0 - 34.0 pg   MCHC 30.1 30.0 - 36.0 g/dL   RDW 17.3 (H) 11.5 - 15.5 %   Platelets 579 (H) 150 - 400 K/uL   Neutrophils Relative % 68 %   Neutro Abs 5.0 1.7 - 7.7 K/uL   Lymphocytes Relative 19 %   Lymphs Abs 1.4 0.7 - 4.0 K/uL   Monocytes Relative 10 %   Monocytes Absolute 0.7 0.1 - 1.0 K/uL   Eosinophils Relative 3 %   Eosinophils Absolute 0.3 0.0 - 0.7 K/uL   Basophils Relative 0 %   Basophils Absolute 0.0 0.0 - 0.1 K/uL    Comment: Performed at [facility] [address], [place], [place] [address]     Constitutional: No distress . Vital signs reviewed. +odor of urine HEENT: EOMI, oral membranes moist Cardiovascular: RRR without  murmur. No JVD    Respiratory: CTA Bilaterally without wheezes or rales. Normal effort    GI: BS +, non-tender, non-distended  Neuro: Alert and Oriented Sensation diminished to light touch below umbilicus Motor: 5/5 in BUE proximal to distal RLE: hip flexion 2+/5, knee extension 3/5, ankle dorsiflexion 3+ tp 4/5 LLE: Hip flexion, knee extension 1-2/5, ankle dorsiflexion 3 -/5--neuro stable Musc/Skel:  No edema, back tender Skin: back inicision c/d/i with staples in place       Assessment/Plan: 1. Functional deficits secondary to paraplegia secondary to epidural abscess which require 3+ hours per day of interdisciplinary therapy in a comprehensive inpatient rehab  setting. Physiatrist is providing close team supervision and 24 hour management of active medical problems listed below. Physiatrist and rehab team continue to assess barriers to discharge/monitor patient progress toward functional and medical goals. FIM: Function - Bathing Bathing activity did not occur: Refused Position: Sitting EOB Body parts bathed by patient: Right arm, Left arm, Chest, Abdomen, Front perineal area, Right upper leg, Left upper leg Body parts bathed by helper: Left lower leg, Right lower leg, Back, Buttocks Bathing not applicable: Front perineal area Assist Level: Touching or steadying assistance(Pt > 75%)  Function- Upper Body Dressing/Undressing What is the patient wearing?: Pull over shirt/dress, Orthosis Pull over shirt/dress - Perfomed by patient: Thread/unthread right sleeve, Thread/unthread left sleeve, Put head through opening, Pull shirt over trunk Pull over shirt/dress - Perfomed by helper: Pull shirt over trunk Assist Level: Touching or steadying assistance(Pt > 75%) Function - Lower Body Dressing/Undressing What is the patient wearing?: Pants, Non-skid slipper socks Position: Other (comment)(standing in stedy) Pants- Performed by helper: Thread/unthread right pants leg, Thread/unthread left pants leg, Pull pants up/down Non-skid slipper socks- Performed by helper: Don/doff right sock, Don/doff left sock Assist for footwear: Dependant Assist for lower body dressing: 2 Helpers  Function - Toileting Toileting steps completed by helper: Adjust clothing prior to toileting, Performs perineal hygiene, Adjust clothing after toileting Assist level: Touching or steadying assistance (Pt.75%)  Function - Air cabin crew transfer activity did not occur: Safety/medical concerns  Function - Chair/bed transfer Chair/bed transfer activity did not occur: Refused Chair/bed transfer method: Other Chair/bed transfer assist level: 2 helpers Chair/bed transfer  assistive device: Facilities manager lift: Stedy Chair/bed transfer details: Verbal cues for technique, Verbal cues for precautions/safety, Verbal cues for safe use of DME/AE, Manual facilitation for placement, Manual facilitation for weight shifting, Manual facilitation for weight bearing  Function - Locomotion: Wheelchair Will patient use wheelchair at discharge?: Yes Type: Manual Max wheelchair distance: 150' Assist Level: Supervision or verbal cues Assist Level: Supervision or verbal cues Assist Level: Supervision or verbal cues Turns around,maneuvers to table,bed, and toilet,negotiates 3% grade,maneuvers on rugs and over doorsills: No Function - Locomotion: Ambulation Ambulation activity did not occur: Safety/medical concerns  Function - Comprehension Comprehension: Auditory Comprehension assist level: Follows basic conversation/direction with no assist  Function - Expression Expression: Verbal Expression assist level: Expresses basic needs/ideas: With no assist  Function - Social Interaction Social Interaction assist level: Interacts appropriately 90% of the time - Needs monitoring or encouragement for participation or interaction.  Function - Problem Solving Problem solving assist level: Solves basic problems with no assist  Function - Memory Memory assist level: Recognizes or recalls 90% of the time/requires cueing < 10% of the time Patient normally able to recall (first 3 days only): Current season, Location of own room, Staff names and faces, That he or she is in a hospital  Medical  Problem List and Plan: 1.Decreased functional mobilitysecondary to T10-11 osteomyelitis/epidural abscess with subsequent myelopathy/paraplegia, status post decompression and fusion T8-L3. Back brace when out of bed. Patient with history of multiple lumbar surgeries as well as spinal cord stimulator January 2019   Continue PT, OT therapies 2. DVT left soleal, right peroneal  DVT's:.     prophylactic doses of Lovenox started on 4/12.    Repeat dopplers 3. Pain Management:Neurontin 600 mg 3 times daily, Robaxin   as needed   Lidoderm patch started on 4/13   -scheduled tylenol  -oxycodone prn.  -xrays of back reviewed and stable   -seems to be doing a little better with current regimen 4. Mood:Aricept 10 mg nightly, Namenda 10 mg twice daily, Effexor sore 150 mg twice daily, trazodone 100 mg nightly 5. Neuropsych: This patientis?fully capable of making decisions on hisown behalf. 6. Skin/Wound Care:Routine skin checks 7. Fluids/Electrolytes/Nutrition:Routine I/O's  BMP reviewed. Potassium now a little low, repleting   - Appetite seems to be improving    -appreciate RD consult.  8.ID/MSSA bacteremia.Intravenous Ancef 2 g every 8 hours through 05/22/2018 and stop as well as rifampin 300 mg twice daily.    -outpt Follow-up per infectious disease 9.Acute on chronic anemia.     Hemoglobin 8 on 4/15   Continue to follow.  10.Hypertension. Norvasc 10 mg daily   Relatively controlled on 4/17 11.Hyperlipidemia. Pravachol 12.History of prostate cancer. Proscar 5 mg daily 13.Constipation. Laxative assistance, +bm 14. Urine retention, occasional incontinence---check ua, ucx  LOS (Days) 6 A FACE TO FACE EVALUATION WAS PERFORMED  [name] 04/05/2018, 7:38 AM

## 2018-04-06 ENCOUNTER — Inpatient Hospital Stay (HOSPITAL_COMMUNITY): Payer: Federal, State, Local not specified - PPO | Admitting: Physical Therapy

## 2018-04-06 ENCOUNTER — Inpatient Hospital Stay (HOSPITAL_COMMUNITY): Payer: Federal, State, Local not specified - PPO | Admitting: *Deleted

## 2018-04-06 ENCOUNTER — Inpatient Hospital Stay (HOSPITAL_COMMUNITY): Payer: Federal, State, Local not specified - PPO

## 2018-04-06 LAB — URINE CULTURE: Culture: NO GROWTH

## 2018-04-06 MED ORDER — MUSCLE RUB 10-15 % EX CREA
TOPICAL_CREAM | CUTANEOUS | Status: DC | PRN
  Administered 2018-04-07: 12:00:00 via TOPICAL
  Filled 2018-04-06: qty 85

## 2018-04-06 NOTE — Progress Notes (Signed)
Physical Therapy Session Note  Patient Details  Name: Cody Velez MRN: [id number] Date of Birth: 21-Feb-1946  Today's Date: 04/06/2018 PT Individual Time: 1345-1455 PT Individual Time Calculation (min): 70 min   Short Term Goals: Week 1:  PT Short Term Goal 1 (Week 1): Pt will complete bed mobility with max assist x 1 consistently PT Short Term Goal 2 (Week 1): Pt will tolerate sitting EOB x 10 min with CGA PT Short Term Goal 3 (Week 1): Pt will perform least restrictive transfer with max assist x 1 PT Short Term Goal 4 (Week 1): Pt will propel manual w/c x 50 ft with CGA  Skilled Therapeutic Interventions/Progress Updates:    Pt supine in bed, agreeable to PT. Pt reports 6/10 back pain at rest, increases to 10/10 with movement. Rolling onto R side with max assist and use of bedrails. Supine to sit with max assist for BLE and trunk control. Stedy transfer bed to lightweight w/c assist x 2. Manual w/c propulsion 2 x 150 ft with lightweight w/c and use of w/c gloves. Stedy transfer w/c to/from therapy mat assist x 2, max assist to stand due to low w/c height. Sitting balance EOM: ball toss 3 x 15 reps with SBA for balance and BLE support on floor. Seated BLE therex: marches, LAQ x 10 reps each with AAROM for marches. Sit to supine with mod assist for LE once back in bed. Positioned pt on R side for pressure relief. Pt left in bed with needs in reach, bed alarm in place.  Therapy Documentation Precautions:  Precautions Precautions: Fall, Back Precaution Booklet Issued: No Precaution Comments: Pt needed min cues to recall 3/3 back precautions Required Braces or Orthoses: Spinal Brace Spinal Brace: Thoracolumbosacral orthotic, Applied in sitting position Restrictions Weight Bearing Restrictions: No  See Function Navigator for Current Functional Status.   Therapy/Group: Individual Therapy  Excell Seltzer, PT, DPT  04/06/2018, 3:45 PM

## 2018-04-06 NOTE — Progress Notes (Signed)
Subjective/Complaints: Complains of some pain around right shoulder blade/inferior border. Otherwise feeling fairly well  ROS: Patient denies fever, rash, sore throat, blurred vision, nausea, vomiting, diarrhea, cough, shortness of breath or chest pain, joint or back pain, headache, or mood change.   Objective: Vital Signs: Blood pressure 136/73, pulse 94, temperature 98.2 F (36.8 C), temperature source Oral, resp. rate 18, height '6\' 3"'  (1.905 m), weight 127.4 kg (280 lb 13.9 oz), SpO2 94 %. No results found. Results for orders placed or performed during the hospital encounter of 03/30/18 (from the past 72 hour(s))  Basic metabolic panel     Status: Abnormal   Collection Time: 04/03/18 10:40 AM  Result Value Ref Range   Sodium 137 135 - 145 mmol/L   Potassium 3.4 (L) 3.5 - 5.1 mmol/L   Chloride 100 (L) 101 - 111 mmol/L   CO2 28 22 - 32 mmol/L   Glucose, Bld 102 (H) 65 - 99 mg/dL   BUN 8 6 - 20 mg/dL   Creatinine, Ser 0.81 0.61 - 1.24 mg/dL   Calcium 8.5 (L) 8.9 - 10.3 mg/dL   GFR calc non Af Amer >60 >60 mL/min   GFR calc Af Amer >60 >60 mL/min    Comment: (NOTE) The eGFR has been calculated using the CKD EPI equation. This calculation has not been validated in all clinical situations. eGFR's persistently <60 mL/min signify possible Chronic Kidney Disease.    Anion gap 9 5 - 15    Comment: Performed at [facility] [address], [place], [place] [address]  CBC with Differential/Platelet     Status: Abnormal   Collection Time: 04/03/18 10:40 AM  Result Value Ref Range   WBC 7.4 4.0 - 10.5 K/uL   RBC 3.05 (L) 4.22 - 5.81 MIL/uL   Hemoglobin 8.0 (L) 13.0 - 17.0 g/dL   HCT 26.6 (L) 39.0 - 52.0 %   MCV 87.2 78.0 - 100.0 fL   MCH 26.2 26.0 - 34.0 pg   MCHC 30.1 30.0 - 36.0 g/dL   RDW 17.3 (H) 11.5 - 15.5 %   Platelets 579 (H) 150 - 400 K/uL   Neutrophils Relative % 68 %   Neutro Abs 5.0 1.7 - 7.7 K/uL   Lymphocytes Relative 19 %   Lymphs Abs 1.4 0.7 - 4.0 K/uL    Monocytes Relative 10 %   Monocytes Absolute 0.7 0.1 - 1.0 K/uL   Eosinophils Relative 3 %   Eosinophils Absolute 0.3 0.0 - 0.7 K/uL   Basophils Relative 0 %   Basophils Absolute 0.0 0.0 - 0.1 K/uL    Comment: Performed at [facility] [address], [place], [place] [address]  Urinalysis, Complete w Microscopic     Status: Abnormal   Collection Time: 04/05/18  4:21 PM  Result Value Ref Range   Color, Urine AMBER (A) YELLOW    Comment: BIOCHEMICALS MAY BE AFFECTED BY COLOR   APPearance CLEAR CLEAR   Specific Gravity, Urine 1.029 1.005 - 1.030   pH 5.0 5.0 - 8.0   Glucose, UA NEGATIVE NEGATIVE mg/dL   Hgb urine dipstick NEGATIVE NEGATIVE   Bilirubin Urine MODERATE (A) NEGATIVE   Ketones, ur NEGATIVE NEGATIVE mg/dL   Protein, ur 30 (A) NEGATIVE mg/dL   Nitrite NEGATIVE NEGATIVE   Leukocytes, UA NEGATIVE NEGATIVE   RBC / HPF 0-5 0 - 5 RBC/hpf   WBC, UA 0-5 0 - 5 WBC/hpf   Bacteria, UA NONE SEEN NONE SEEN  Squamous Epithelial / LPF 0-5 (A) NONE SEEN   Mucus PRESENT    Hyaline Casts, UA PRESENT     Comment: Performed at [facility], [place] [address], [place], [place] [address]     Constitutional: No distress . Vital signs reviewed. HEENT: EOMI, oral membranes moist Cardiovascular: RRR without murmur. No JVD    Respiratory: CTA Bilaterally without wheezes or rales. Normal effort    GI: BS +, non-tender, non-distended  Neuro: Alert and Oriented Sensation diminished to light touch below umbilicus Motor: 5/5 in BUE proximal to distal RLE: hip flexion 2+/5, knee extension 3/5, ankle dorsiflexion 3+ tp 4/5 LLE: Hip flexion, knee extension 1-2/5, ankle dorsiflexion 3 -/5--neuro stable Musc/Skel:  No edema, mild tenderness along serratus posterior Skin: back inicision intact, staples removed       Assessment/Plan: 1. Functional deficits secondary to paraplegia secondary to epidural abscess which require 3+ hours per day of interdisciplinary therapy in a comprehensive  inpatient rehab setting. Physiatrist is providing close team supervision and 24 hour management of active medical problems listed below. Physiatrist and rehab team continue to assess barriers to discharge/monitor patient progress toward functional and medical goals. FIM: Function - Bathing Bathing activity did not occur: Refused Position: Sitting EOB Body parts bathed by patient: Right arm, Left arm, Chest, Abdomen, Front perineal area, Right upper leg, Left upper leg Body parts bathed by helper: Left lower leg, Right lower leg, Back, Buttocks Bathing not applicable: Front perineal area Assist Level: Touching or steadying assistance(Pt > 75%)  Function- Upper Body Dressing/Undressing What is the patient wearing?: Pull over shirt/dress, Orthosis Pull over shirt/dress - Perfomed by patient: Thread/unthread right sleeve, Thread/unthread left sleeve, Put head through opening Pull over shirt/dress - Perfomed by helper: Pull shirt over trunk Assist Level: Touching or steadying assistance(Pt > 75%) Function - Lower Body Dressing/Undressing What is the patient wearing?: Pants, Non-skid slipper socks Position: Bed Pants- Performed by helper: Thread/unthread right pants leg, Thread/unthread left pants leg, Pull pants up/down Non-skid slipper socks- Performed by helper: Don/doff right sock, Don/doff left sock Assist for footwear: Dependant Assist for lower body dressing: 2 Helpers  Function - Toileting Toileting steps completed by helper: Adjust clothing prior to toileting, Performs perineal hygiene, Adjust clothing after toileting Assist level: Touching or steadying assistance (Pt.75%)  Function - Air cabin crew transfer activity did not occur: Safety/medical concerns  Function - Chair/bed transfer Chair/bed transfer activity did not occur: Refused Chair/bed transfer method: Other Chair/bed transfer assist level: 2 helpers Chair/bed transfer assistive device: Brewing technologist lift: Stedy Chair/bed transfer details: Verbal cues for technique, Verbal cues for precautions/safety, Verbal cues for safe use of DME/AE, Manual facilitation for placement, Manual facilitation for weight shifting, Manual facilitation for weight bearing  Function - Locomotion: Wheelchair Will patient use wheelchair at discharge?: Yes Type: Manual Max wheelchair distance: 150' Assist Level: Supervision or verbal cues Assist Level: Supervision or verbal cues Assist Level: Supervision or verbal cues Turns around,maneuvers to table,bed, and toilet,negotiates 3% grade,maneuvers on rugs and over doorsills: No Function - Locomotion: Ambulation Ambulation activity did not occur: Safety/medical concerns  Function - Comprehension Comprehension: Auditory Comprehension assist level: Follows complex conversation/direction with no assist  Function - Expression Expression: Verbal Expression assist level: Expresses complex ideas: With no assist  Function - Social Interaction Social Interaction assist level: Interacts appropriately with others - No medications needed.  Function - Problem Solving Problem solving assist level: Solves complex problems: With extra time  Function - Memory Memory assist  level: Recognizes or recalls 90% of the time/requires cueing < 10% of the time Patient normally able to recall (first 3 days only): Current season, Location of own room, Staff names and faces, That he or she is in a hospital  Medical Problem List and Plan: 1.Decreased functional mobilitysecondary to T10-11 osteomyelitis/epidural abscess with subsequent myelopathy/paraplegia, status post decompression and fusion T8-L3. Back brace when out of bed. Patient with history of multiple lumbar surgeries as well as spinal cord stimulator January 2019   Continue PT, OT therapies. Working through pain 2. DVT left soleal, right peroneal DVT's:.     prophylactic doses of Lovenox started on 4/12.     Repeat dopplers next week 3. Pain Management:Neurontin 600 mg 3 times daily, Robaxin   as needed   Lidoderm patch started on 4/13   -scheduled tylenol  -oxycodone prn.  -xrays of back reviewed and stable   -having some pain below right scapula--suspect muscular    -add muscle rub, heating pad 4. Mood:Aricept 10 mg nightly, Namenda 10 mg twice daily, Effexor sore 150 mg twice daily, trazodone 100 mg nightly 5. Neuropsych: This patientis?fully capable of making decisions on hisown behalf. 6. Skin/Wound Care:staples out per NS 7. Fluids/Electrolytes/Nutrition:Routine I/O's  BMP reviewed. Potassium now a little low, repleting   - Appetite seems to be improving    -appreciate RD consult.  8.ID/MSSA bacteremia.Intravenous Ancef 2 g every 8 hours through 05/22/2018 and stop as well as rifampin 300 mg twice daily.    -outpt Follow-up per infectious disease 9.Acute on chronic anemia.     Hemoglobin 8 on 4/15 (stable)---recheck next week    10.Hypertension. Norvasc 10 mg daily   Relatively controlled on 4/17 11.Hyperlipidemia. Pravachol 12.History of prostate cancer. Proscar 5 mg daily 13.Constipation. Laxative assistance, +bm 14. Urine retention, occasional incontinence---Ua negative, ucx pending  LOS (Days) 7 A FACE TO FACE EVALUATION WAS PERFORMED  [name] 04/06/2018, 8:33 AM

## 2018-04-06 NOTE — Plan of Care (Signed)
  Problem: Consults Goal: RH SPINAL CORD INJURY PATIENT EDUCATION Description Pt able to verbalize and initiate education regarding spinal cord injury with MOD I assist   Outcome: Progressing Goal: Skin Care Protocol Initiated - if Braden Score 18 or less Description Pt will have improved skin breakdown while in rehab with min assist   Outcome: Progressing   Problem: SCI BOWEL ELIMINATION Goal: RH STG MANAGE BOWEL WITH ASSISTANCE Description STG Manage Bowel with min Assistance.  Outcome: Progressing Goal: RH STG SCI MANAGE BOWEL WITH MEDICATION WITH ASSISTANCE Description STG SCI Manage bowel with medication with Min assistance.  Outcome: Progressing Goal: RH STG SCI MANAGE BOWEL PROGRAM W/ASSIST OR AS APPROPRIATE Description STG SCI Manage bowel program w/min assist or as appropriate.  Outcome: Progressing Goal: RH OTHER STG BOWEL ELIMINATION GOALS W/ASSIST Description Other STG Bowel Elimination Goals With [place].  Outcome: Progressing   Problem: SCI BLADDER ELIMINATION Goal: RH STG MANAGE BLADDER WITH ASSISTANCE Description STG Manage Bladder With min Assistance  Outcome: Progressing Goal: RH STG MANAGE BLADDER WITH EQUIPMENT WITH ASSISTANCE Description STG Manage Bladder With Equipment With min  Assistance  Outcome: Progressing   Problem: RH SKIN INTEGRITY Goal: RH STG SKIN FREE OF INFECTION/BREAKDOWN Description Skin will remain free from breakdown with min assist  Outcome: Progressing Goal: RH STG MAINTAIN SKIN INTEGRITY WITH ASSISTANCE Description STG Maintain Skin Integrity With min Assistance.  Outcome: Progressing   Problem: RH SAFETY Goal: RH STG ADHERE TO SAFETY PRECAUTIONS W/ASSISTANCE/DEVICE Description STG Adhere to Safety Precautions With min  Assistance/Device.  Outcome: Progressing   Problem: RH PAIN MANAGEMENT Goal: RH STG PAIN MANAGED AT OR BELOW PT'S PAIN GOAL Description Pain goal is less than 3 with min assist of PRN medications   Outcome: Progressing

## 2018-04-06 NOTE — Progress Notes (Signed)
Occupational Therapy Session Note  Patient Details  Name: Cody Velez MRN: [id number] Date of Birth: December 19, 1946  Today's Date: 04/06/2018 OT Individual Time: 0830-1000 OT Individual Time Calculation (min): 90 min    Short Term Goals: Week 1:  OT Short Term Goal 1 (Week 1): Pt will tolerate sitting EOB for 5 mins in preparation for BADL tasks. OT Short Term Goal 2 (Week 1): Pt will direct care to don TLSO with min A from caregiver OT Short Term Goal 3 (Week 1): Pt will maintain sitting balance at EOB with no more than mod A in preparation for BADL tasks OT Short Term Goal 4 (Week 1): Pt will recall 3/3 back precautions without cues  Skilled Therapeutic Interventions/Progress Updates:    OT intervention with focus on bathing at shower level and dressing seated EOB and at bed level.  Pt requires assistance moving BLE over edge of bed and min A for supine>sit EOB in preparation for transfer to shower with Stedy. Pt relies on BUE strength for sit<>stand and when standing.  Pt required assistance with bathing buttocks and lower legs.  Pt completed LB dressing at bed level secondary to fatigue from shower.  Pt remained in bed with all needs within reach.   Therapy Documentation Precautions:  Precautions Precautions: Fall, Back Precaution Booklet Issued: No Precaution Comments: Pt needed min cues to recall 3/3 back precautions Required Braces or Orthoses: Spinal Brace Spinal Brace: Thoracolumbosacral orthotic, Applied in sitting position Restrictions Weight Bearing Restrictions: No Pain:  Pt c/o 6/10 back pain and numbness in BLE; RN aware and repsoitioned  See Function Navigator for Current Functional Status.   Therapy/Group: Individual Therapy  [name] 04/06/2018, 10:02 AM

## 2018-04-06 NOTE — Progress Notes (Signed)
Physical Therapy Session Note  Patient Details  Name: Cody Velez MRN: [id number] Date of Birth: August 27, 1946  Today's Date: 04/05/2018 PT Individual Time:1535-1600     Short Term Goals: Week 1:  PT Short Term Goal 1 (Week 1): Pt will complete bed mobility with max assist x 1 consistently PT Short Term Goal 2 (Week 1): Pt will tolerate sitting EOB x 10 min with CGA PT Short Term Goal 3 (Week 1): Pt will perform least restrictive transfer with max assist x 1 PT Short Term Goal 4 (Week 1): Pt will propel manual w/c x 50 ft with CGA  Skilled Therapeutic Interventions/Progress Updates:   Pt received sitting in WC and agreeable to PT  PT instructed pt in sit<>stand in Stedy x 3 with mod assist from PT. Min cues for from PT for proper UE placement and improved anterior weight shift. Standing tolerance in stedy 2 x 45 sec with min assist form PT for safety and cues for posture. Stedy transfer to bed with mod assist from PT. Sit>supine with max assist from BLE management and moderate cues for safety through log roll into supine. Pt left supine in bed with call bell in reach and all needs met.        Therapy Documentation Precautions:  Precautions Precautions: Fall, Back Precaution Booklet Issued: No Precaution Comments: Pt needed min cues to recall 3/3 back precautions Required Braces or Orthoses: Spinal Brace Spinal Brace: Thoracolumbosacral orthotic, Applied in sitting position Restrictions Weight Bearing Restrictions: No Vital Signs: Therapy Vitals Temp: 98.2 F (36.8 C) Temp Source: Oral Pulse Rate: 94 Resp: 18 BP: 136/73 Patient Position (if appropriate): Lying Oxygen Therapy SpO2: 94 % O2 Device: Room Air Pain: Pain Assessment Pain Scale: 0-10 Pain Score: 7  Pain Type: Chronic pain;Surgical pain Pain Location: Back Pain Orientation: Mid Pain Descriptors / Indicators: Aching Pain Frequency: Intermittent Pain Onset: On-going Pain Intervention(s): Medication (See  eMAR)   See Function Navigator for Current Functional Status.   Therapy/Group: Individual Therapy  Lorie Phenix 04/06/2018, 6:24 AM

## 2018-04-06 NOTE — Progress Notes (Signed)
Physical Therapy Session Note  Patient Details  Name: Cody Velez MRN: [id number] Date of Birth: Apr 10, 1946  Today's Date: 04/06/2018 PT Individual Time: 1015-1100 PT Individual Time Calculation (min): 45 min   Short Term Goals: Week 1:  PT Short Term Goal 1 (Week 1): Pt will complete bed mobility with max assist x 1 consistently PT Short Term Goal 2 (Week 1): Pt will tolerate sitting EOB x 10 min with CGA PT Short Term Goal 3 (Week 1): Pt will perform least restrictive transfer with max assist x 1 PT Short Term Goal 4 (Week 1): Pt will propel manual w/c x 50 ft with CGA  Skilled Therapeutic Interventions/Progress Updates:    Pt supine in bed, agreeable to PT. Pt reports pain in low back, not rated. Supine to sit with mod assist for LE. Pt is dependent to don TLSO while seated EOB with SBA for seated balance. Sit to stand x 10 reps to stedy with min assist, verbal and manual cues for upright posture and for decreased reliance on UE while in standing. Pt tolerates standing for 20 sec max with each bout of standing. Sitting balance EOB: 2# ball punchouts 3 x 10 reps. Pt is able to recall 2/3 back precautions and is able to remember 3rd with minor cueing, pt does recall BLT acronym. Sit to supine mod assist for LE management, significant increase in low back pain with transfer that is somewhat relieved with repositioning. Pt left supine in bed with needs in reach and bed alarm in place, nursing notified that pt requests pain medication.  Therapy Documentation Precautions:  Precautions Precautions: Fall, Back Precaution Booklet Issued: No Precaution Comments: Pt needed min cues to recall 3/3 back precautions Required Braces or Orthoses: Spinal Brace Spinal Brace: Thoracolumbosacral orthotic, Applied in sitting position Restrictions Weight Bearing Restrictions: No  See Function Navigator for Current Functional Status.   Therapy/Group: Individual Therapy  Excell Seltzer, PT,  DPT  04/06/2018, 11:23 AM

## 2018-04-07 ENCOUNTER — Inpatient Hospital Stay (HOSPITAL_COMMUNITY): Payer: Federal, State, Local not specified - PPO

## 2018-04-07 ENCOUNTER — Inpatient Hospital Stay (HOSPITAL_COMMUNITY): Payer: Federal, State, Local not specified - PPO | Admitting: Physical Therapy

## 2018-04-07 NOTE — Progress Notes (Signed)
Occupational Therapy Weekly Progress Note  Patient Details  Name: Cody Velez MRN: [id number] Date of Birth: 1946/03/13  Beginning of progress report period: March 31, 2018 End of progress report period: April 07, 2018  Patient has met 3 of 3 short term goals.  Pt has made steady progress with BADLs since admission.  Pt requires mod A for rolling in bed to facilitate LB bathing/dressing tasks.  Pt requires assistance raising BLE in bed to facilitate threading pants.  Pt currently unable to bathe buttocks or lower legs.  Pt requires max A for moving BLE over EOB and min A for sidelying>sit EOB.  Pt requires min A for sitting balance to complete UB bathing/dressing tasks.  Pt requires tot A for donning TLSO but is independent with directing care.  Pt requires use of Stedy for functional transfers but performs sit<>stand with min A.  Pt fatigues quickly and requires multiple rest breaks during sessions.   Patient continues to demonstrate the following deficits: muscle weakness and muscle paralysis, decreased coordination and decreased sitting balance, decreased standing balance, decreased postural control and hemiplegia and therefore will continue to benefit from skilled OT intervention to enhance overall performance with BADL.  Patient progressing toward long term goals..  Continue plan of care.  OT Short Term Goals Week 1:  OT Short Term Goal 1 (Week 1): Pt will tolerate sitting EOB for 5 mins in preparation for BADL tasks. OT Short Term Goal 2 (Week 1): Pt will direct care to don TLSO with min A from caregiver OT Short Term Goal 3 (Week 1): Pt will maintain sitting balance at EOB with no more than mod A in preparation for BADL tasks OT Short Term Goal 4 (Week 1): Pt will recall 3/3 back precautions without cues Week 2:  OT Short Term Goal 1 (Week 2): Pt will perform BSC squat pivot transfer with max A. OT Short Term Goal 2 (Week 2): Pt will thread pants over BLE sitting EOB using AE PRN with  max A OT Short Term Goal 3 (Week 2): Pt will pull up pants sit<>stand in Stedy with max A      Therapy Documentation Precautions:  Precautions Precautions: Fall, Back Precaution Booklet Issued: No Precaution Comments: Pt needed min cues to recall 3/3 back precautions Required Braces or Orthoses: Spinal Brace Spinal Brace: Thoracolumbosacral orthotic, Applied in sitting position Restrictions Weight Bearing Restrictions: No  See Function Navigator for Current Functional Status.    Leotis Shames [facility] 04/07/2018, 12:01 PM

## 2018-04-07 NOTE — Progress Notes (Signed)
Occupational Therapy Note  Patient Details  Name: KAYLIB FURNESS MRN: [id number] Date of Birth: 11/27/1946  Today's Date: 04/07/2018 OT Individual Time: 1345-1430 OT Individual Time Calculation (min): 45 min   Pt c/o 7/10 pain in back resting in bed, 8.5/10 in back with activity; RN aware Individual Therapy  OT intervention with initial focus on bed mobility, sitting balance, and sit<>stand with Stedy (focus on limiting BUE use and increasing pushing through BLE).  Pt required min A for moving BLE off EOB and steady A for sidelying>sitting EOB.  Pt maintained sitting balance without assistance while TLSO donned.  Pt performed sit<>stand with Stedy and limited BUE use X 6 before transferring to w/c.  Pt engaged in BUE therex on Sci[employer] (level 4 X 4 X 2). Pt returned to room and remained in w/c with all needs within reach and family present.    Leotis Shames [facility] 04/07/2018, 2:43 PM

## 2018-04-07 NOTE — Progress Notes (Signed)
Occupational Therapy Session Note  Patient Details  Name: Cody Velez MRN: [id number] Date of Birth: [birth year]  Today's Date: 04/07/2018 OT Individual Time: 0900-1000 OT Individual Time Calculation (min): 60 min    Short Term Goals: Week 1:  OT Short Term Goal 1 (Week 1): Pt will tolerate sitting EOB for 5 mins in preparation for BADL tasks. OT Short Term Goal 2 (Week 1): Pt will direct care to don TLSO with min A from caregiver OT Short Term Goal 3 (Week 1): Pt will maintain sitting balance at EOB with no more than mod A in preparation for BADL tasks OT Short Term Goal 4 (Week 1): Pt will recall 3/3 back precautions without cues  Skilled Therapeutic Interventions/Progress Updates:    OT intervention with focus on LB bathing/dressing at bed level and UB bathing/dressing sitting EOB.  Pt requires mod A for rolling R/L to facilitate bathing buttocks and donning pants/pulling over hips.  Pt required max A for moving BLE over EOB and min A for sidelying>sitting EOB.  Pt requires occasional steady A for sitting balance at EOB.  Pt completed UB bathing/dressing with steady A for balance.  Pt performed sit<>stand from EOB with Stedy at supervision level.  Pt incorporating BLE during sit<>stand but still heavily relies on BUE strength.  Pt remained seated on Stedy to wash face and brush teeth.  Pt completed shaving tasks seated in w/c.  Focus on activity tolerance, bed mobility, sitting balance, sit<>stand, and safety awareness to increase independence with BADLs.   Therapy Documentation Precautions:  Precautions Precautions: Fall, Back Precaution Booklet Issued: No Precaution Comments: Pt needed min cues to recall 3/3 back precautions Required Braces or Orthoses: Spinal Brace Spinal Brace: Thoracolumbosacral orthotic, Applied in sitting position Restrictions Weight Bearing Restrictions: No   Pain: Pain Assessment Pain Score: 8 Pain Type: Acute pain Pain Location: Back Pain  Descriptors / Indicators: Aching;Discomfort Pain Intervention(s): Medication (See eMAR)       See Function Navigator for Current Functional Status.   Individual therapy  [name] 04/07/2018, 11:05 AM

## 2018-04-07 NOTE — Progress Notes (Signed)
Nutrition Follow-up  DOCUMENTATION CODES:   Obesity unspecified  INTERVENTION:  Continue Ensure Enlive po BID, each supplement provides 350 kcal and 20 grams of protein  Encourage adequate PO intake.   NUTRITION DIAGNOSIS:   Inadequate oral intake related to poor appetite as evidenced by per patient/family report; improving  GOAL:   Patient will meet greater than or equal to 90% of their needs; progressing  MONITOR:   PO intake, Supplement acceptance, Labs, Weight trends, I & O's, Skin  REASON FOR ASSESSMENT:   Consult Poor PO  ASSESSMENT:   72 y.o. right-handed male with history of hypertension, memory deficits maintained on Aricept and Namenda, prostate cancer, PTSD, chronic back pain with multiple lumbar surgeries with recent spinal cord stimulator placement January 2019 as well as treatment for recent UTI.  Presented 03/16/2018 with lethargy/fall as well as hypoxemia with paraparesis.  X-rays and imaging revealed osteomyelitis T10-T11 with epidural abscess. Patient underwent laminectomy T10-T11 decompression of epidural abscess debridement of discitis T10-T11 with segmental fixation from T8-L3 03/28/2018.  Meal completion has been 25-100% with most intake 75-100%. Appetite has been improving. Pt currently has Ensure ordered with varied intake. RD to continue with current orders to aid in caloric and protein needs. Pt encouraged to eat his food at meals and to drink his supplements.   Diet Order:  Diet regular Room service appropriate? Yes; Fluid consistency: Thin Fall precautions  EDUCATION NEEDS:   No education needs have been identified at this time  Skin:  Skin Assessment: Skin Integrity Issues: Skin Integrity Issues:: Stage II Stage II: coccyx  Last BM:  4/18  Height:   Ht Readings from Last 1 Encounters:  03/30/18 6\' 3"  (1.905 m)    Weight:   Wt Readings from Last 1 Encounters:  04/07/18 281 lb 8.4 oz (127.7 kg)    Ideal Body Weight:  89 kg  BMI:   Body mass index is 35.19 kg/m.  Estimated Nutritional Needs:   Kcal:  2150-2350  Protein:  105-120 grams  Fluid:  >/= 2.1 L/day    Corrin Parker, MS, RD, LDN Pager # [phone] After hours/ weekend pager # [phone]

## 2018-04-07 NOTE — Progress Notes (Signed)
Physical Therapy Weekly Progress Note  Patient Details  Name: Cody Velez MRN: [id number] Date of Birth: Aug 11, 1946  Beginning of progress report period: March 31, 2018 End of progress report period: April 07, 2018  Today's Date: 04/07/2018 PT Individual Time: 1000-1100 PT Individual Time Calculation (min): 60 min   Patient has met 4 of 4 short term goals.  Pt has made excellent progress this week and has demonstrated improvements in functional strength and activity tolerance.  Continues to be somewhat limited by pain.  Patient continues to demonstrate the following deficits muscle weakness and muscle joint tightness, abnormal tone, unbalanced muscle activation and decreased coordination and decreased sitting balance, decreased standing balance, decreased postural control and decreased balance strategies and therefore will continue to benefit from skilled PT intervention to increase functional independence with mobility.  Patient progressing toward long term goals..  Continue plan of care.  PT Short Term Goals Week 1:  PT Short Term Goal 1 (Week 1): Pt will complete bed mobility with max assist x 1 consistently PT Short Term Goal 1 - Progress (Week 1): Met PT Short Term Goal 2 (Week 1): Pt will tolerate sitting EOB x 10 min with CGA PT Short Term Goal 2 - Progress (Week 1): Met PT Short Term Goal 3 (Week 1): Pt will perform least restrictive transfer with max assist x 1 PT Short Term Goal 3 - Progress (Week 1): Met PT Short Term Goal 4 (Week 1): Pt will propel manual w/c x 50 ft with CGA PT Short Term Goal 4 - Progress (Week 1): Met Week 2:  PT Short Term Goal 1 (Week 2): Pt will demonstrate sit<>stand with RW and max assist +1 PT Short Term Goal 2 (Week 2): pt will initiate pre-gait training with PT PT Short Term Goal 3 (Week 2): Pt will propel manual w/c x50' with supervision  Skilled Therapeutic Interventions/Progress Updates:    c/o 9/10 pain at start of session, RN in to  medicate.  PT attempted to find standard w/c for pt (to replace high back) but none were available in pt's size.  Session focus on sit<>stand transfers, and upright posture/standing tolerance.  Pt completes sit<>stand in stedy from w/c with min assist with heavy reliance on UEs, for transfer to therapy mat.  From therapy mat at 22.5" and 21.5" pt completes 3 sit<>stands focus on pushing through UEs/LEs rather than pulling up on stedy.  On final stand, attempted to have pt complete table top activity from supported standing in stedy, but pt became diaphoretic and less responsive, stating he felt "funny".  Returned to seated on mat, fanned pt for cool air, and pt returned to baseline.  BP when assessed 117/87.  Returned to room at end of session and returned to bed with stedy, mod assist to lift LEs into bed and min to roll to back and position in bed.  Call bell in reach and needs met.   Therapy Documentation Precautions:  Precautions Precautions: Fall, Back Precaution Booklet Issued: No Precaution Comments: Pt needed min cues to recall 3/3 back precautions Required Braces or Orthoses: Spinal Brace Spinal Brace: Thoracolumbosacral orthotic, Applied in sitting position Restrictions Weight Bearing Restrictions: No   See Function Navigator for Current Functional Status.  Therapy/Group: Individual Therapy  Cody Velez 04/07/2018, 11:02 AM

## 2018-04-07 NOTE — Plan of Care (Signed)
  Problem: Consults Goal: RH SPINAL CORD INJURY PATIENT EDUCATION Description Pt able to verbalize and initiate education regarding spinal cord injury with MOD I assist   Outcome: Progressing Goal: Skin Care Protocol Initiated - if Braden Score 18 or less Description Pt will have improved skin breakdown while in rehab with min assist   Outcome: Progressing   Problem: SCI BOWEL ELIMINATION Goal: RH STG MANAGE BOWEL WITH ASSISTANCE Description STG Manage Bowel with min Assistance.  Outcome: Progressing Goal: RH STG SCI MANAGE BOWEL WITH MEDICATION WITH ASSISTANCE Description STG SCI Manage bowel with medication with Min assistance.  Outcome: Progressing Goal: RH STG SCI MANAGE BOWEL PROGRAM W/ASSIST OR AS APPROPRIATE Description STG SCI Manage bowel program w/min assist or as appropriate.  Outcome: Progressing Goal: RH OTHER STG BOWEL ELIMINATION GOALS W/ASSIST Description Other STG Bowel Elimination Goals With [place].  Outcome: Progressing   Problem: SCI BLADDER ELIMINATION Goal: RH STG MANAGE BLADDER WITH ASSISTANCE Description STG Manage Bladder With min Assistance  Outcome: Progressing Goal: RH STG MANAGE BLADDER WITH EQUIPMENT WITH ASSISTANCE Description STG Manage Bladder With Equipment With min  Assistance  Outcome: Progressing   Problem: RH SKIN INTEGRITY Goal: RH STG SKIN FREE OF INFECTION/BREAKDOWN Description Skin will remain free from breakdown with min assist  Outcome: Progressing Goal: RH STG MAINTAIN SKIN INTEGRITY WITH ASSISTANCE Description STG Maintain Skin Integrity With min Assistance.  Outcome: Progressing   Problem: RH SAFETY Goal: RH STG ADHERE TO SAFETY PRECAUTIONS W/ASSISTANCE/DEVICE Description STG Adhere to Safety Precautions With min  Assistance/Device.  Outcome: Progressing   Problem: RH PAIN MANAGEMENT Goal: RH STG PAIN MANAGED AT OR BELOW PT'S PAIN GOAL Description Pain goal is less than 3 with min assist of PRN medications   Outcome: Progressing

## 2018-04-07 NOTE — Progress Notes (Signed)
Subjective/Complaints: Right shoulder feeling better. Low back sore as he "did more" yesterday. Happy that his legs are becoming stronger  ROS: Patient denies fever, rash, sore throat, blurred vision, nausea, vomiting, diarrhea, cough, shortness of breath or chest pain, joint or back pain, headache, or mood change.    Objective: Vital Signs: Blood pressure 133/74, pulse 89, temperature 98.8 F (37.1 C), temperature source Oral, resp. rate 18, height 6\' 3"  (1.905 m), weight 127.7 kg (281 lb 8.4 oz), SpO2 97 %. No results found. Results for orders placed or performed during the hospital encounter of 03/30/18 (from the past 72 hour(s))  Urinalysis, Complete w Microscopic     Status: Abnormal   Collection Time: 04/05/18  4:21 PM  Result Value Ref Range   Color, Urine AMBER (A) YELLOW    Comment: BIOCHEMICALS MAY BE AFFECTED BY COLOR   APPearance CLEAR CLEAR   Specific Gravity, Urine 1.029 1.005 - 1.030   pH 5.0 5.0 - 8.0   Glucose, UA NEGATIVE NEGATIVE mg/dL   Hgb urine dipstick NEGATIVE NEGATIVE   Bilirubin Urine MODERATE (A) NEGATIVE   Ketones, ur NEGATIVE NEGATIVE mg/dL   Protein, ur 30 (A) NEGATIVE mg/dL   Nitrite NEGATIVE NEGATIVE   Leukocytes, UA NEGATIVE NEGATIVE   RBC / HPF 0-5 0 - 5 RBC/hpf   WBC, UA 0-5 0 - 5 WBC/hpf   Bacteria, UA NONE SEEN NONE SEEN   Squamous Epithelial / LPF 0-5 (A) NONE SEEN   Mucus PRESENT    Hyaline Casts, UA PRESENT     Comment: Performed at [facility], [address] [place], [place] [address]  Culture, Urine     Status: None   Collection Time: 04/05/18  5:56 PM  Result Value Ref Range   Specimen Description URINE, RANDOM    Special Requests NONE    Culture      NO GROWTH Performed at [facility], [place] [address] [place], [place] [address]    Report Status 04/06/2018 FINAL      Constitutional: No distress . Vital signs reviewed. HEENT: EOMI, oral membranes moist Cardiovascular: RRR without murmur. No JVD     Respiratory: CTA Bilaterally without wheezes or rales. Normal effort    GI: BS +, non-tender, non-distended  Neuro: Alert and Oriented Sensation diminished to light touch below umbilicus Motor: 5/5 in BUE proximal to distal RLE: hip flexion 2+/5, knee extension 3+/5, ankle dorsiflexion 3+ tp 4/5 LLE: Hip flexion  1+ to 2/5, KE 3/5 ankle dorsiflexion 3 -/5--neuro  Musc/Skel: crepitus, pain right knee with ROM Skin: back incision intact       Assessment/Plan: 1. Functional deficits secondary to paraplegia secondary to epidural abscess which require 3+ hours per day of interdisciplinary therapy in a comprehensive inpatient rehab setting. Physiatrist is providing close team supervision and 24 hour management of active medical problems listed below. Physiatrist and rehab team continue to assess barriers to discharge/monitor patient progress toward functional and medical goals. FIM: Function - Bathing Bathing activity did not occur: Refused Position: Shower Body parts bathed by patient: Right arm, Left arm, Chest, Abdomen, Front perineal area, Right upper leg, Left upper leg Body parts bathed by helper: Buttocks, Left lower leg, Right lower leg Bathing not applicable: Front perineal area Assist Level: Touching or steadying assistance(Pt > 75%)  Function- Upper Body Dressing/Undressing What is the patient wearing?: Pull over shirt/dress, Orthosis Pull over shirt/dress - Perfomed by patient: Thread/unthread right sleeve, Thread/unthread left sleeve, Put head through opening Pull  over shirt/dress - Perfomed by helper: Pull shirt over trunk Orthosis activity level: Performed by helper Assist Level: Touching or steadying assistance(Pt > 75%) Function - Lower Body Dressing/Undressing What is the patient wearing?: Pants, Non-skid slipper socks Position: Bed Pants- Performed by helper: Thread/unthread right pants leg, Thread/unthread left pants leg, Pull pants up/down Non-skid slipper socks-  Performed by helper: Don/doff right sock, Don/doff left sock Assist for footwear: Dependant Assist for lower body dressing: 2 Helpers  Function - Toileting Toileting steps completed by patient: Performs perineal hygiene Toileting steps completed by helper: Adjust clothing prior to toileting, Performs perineal hygiene, Adjust clothing after toileting Assist level: No help/no cues  Function - Air cabin crew transfer activity did not occur: Safety/medical concerns Toilet transfer assistive device: Mechanical lift Mechanical lift: Stedy Assist level to toilet: 2 helpers Assist level from toilet: Dependent (Pt equals 0%)  Function - Chair/bed transfer Chair/bed transfer activity did not occur: N/A Chair/bed transfer method: Other Chair/bed transfer assist level: 2 helpers Chair/bed transfer assistive device: Mechanical lift Mechanical lift: Stedy Chair/bed transfer details: Verbal cues for technique, Verbal cues for precautions/safety, Verbal cues for safe use of DME/AE, Manual facilitation for placement, Manual facilitation for weight shifting, Manual facilitation for weight bearing  Function - Locomotion: Wheelchair Will patient use wheelchair at discharge?: Yes Type: Manual Wheelchair activity did not occur: N/A Max wheelchair distance: 150' Assist Level: Supervision or verbal cues Wheel 50 feet with 2 turns activity did not occur: N/A Assist Level: Supervision or verbal cues Wheel 150 feet activity did not occur: N/A Assist Level: Supervision or verbal cues Turns around,maneuvers to table,bed, and toilet,negotiates 3% grade,maneuvers on rugs and over doorsills: No Function - Locomotion: Ambulation Ambulation activity did not occur: N/A Walk 10 feet activity did not occur: N/A Walk 50 feet with 2 turns activity did not occur: N/A Walk 150 feet activity did not occur: N/A Walk 10 feet on uneven surfaces activity did not occur: N/A  Function -  Comprehension Comprehension: Auditory Comprehension assist level: Follows complex conversation/direction with no assist  Function - Expression Expression: Verbal Expression assist level: Expresses complex ideas: With no assist  Function - Social Interaction Social Interaction assist level: Interacts appropriately with others - No medications needed.  Function - Problem Solving Problem solving assist level: Solves complex problems: Recognizes & self-corrects  Function - Memory Memory assist level: Complete Independence: No helper Patient normally able to recall (first 3 days only): Current season, Location of own room, Staff names and faces, That he or she is in a hospital  Medical Problem List and Plan: 1.Decreased functional mobilitysecondary to T10-11 osteomyelitis/epidural abscess with subsequent myelopathy/paraplegia, status post decompression and fusion T8-L3. Back brace when out of bed. Patient with history of multiple lumbar surgeries as well as spinal cord stimulator January 2019   Continue PT, OT therapies.   2. DVT left soleal, right peroneal DVT's:.     prophylactic doses of Lovenox started on 4/12.    Repeat dopplers over the next two weeks 3. Pain Management:Neurontin 600 mg 3 times daily, Robaxin   as needed   Lidoderm patch started on 4/13   -scheduled tylenol  -oxycodone prn.  -scapular pain improved  -pt continues to work through pain 4. Mood:Aricept 10 mg nightly, Namenda 10 mg twice daily, Effexor sore 150 mg twice daily, trazodone 100 mg nightly 5. Neuropsych: This patientisfully capable of making decisions on hisown behalf. 6. Skin/Wound Care:staples out per NS 7. Fluids/Electrolytes/Nutrition:Routine I/O's  -supplementing potassium    -  Appetite seems to be improving    -appreciate RD consult.  8.ID/MSSA bacteremia.Intravenous Ancef 2 g every 8 hours through 05/22/2018 and stop as well as rifampin 300 mg twice daily.    -outpt Follow-up per  infectious disease 9.Acute on chronic anemia.     Hemoglobin 8 on 4/15 (stable)---recheck next week    10.Hypertension. Norvasc 10 mg daily   Relatively controlled on 4/19 11.Hyperlipidemia. Pravachol 12.History of prostate cancer. Proscar 5 mg daily 13.Constipation. Laxative assistance, +bm 14. Urine retention, occasional incontinence seems to be improving  ---Ua negative, ucx negative  LOS (Days) 8 A FACE TO FACE EVALUATION WAS PERFORMED  [name] 04/07/2018, 9:45 AM

## 2018-04-07 NOTE — Progress Notes (Signed)
Physical Therapy Session Note  Patient Details  Name: Cody Velez MRN: [id number] Date of Birth: Sep 29, 1946  Today's Date: 04/07/2018 PT Individual Time: 1445-1510 PT Individual Time Calculation (min): 25 min   Short Term Goals: Week 1:  PT Short Term Goal 1 (Week 1): Pt will complete bed mobility with max assist x 1 consistently PT Short Term Goal 1 - Progress (Week 1): Met PT Short Term Goal 2 (Week 1): Pt will tolerate sitting EOB x 10 min with CGA PT Short Term Goal 2 - Progress (Week 1): Met PT Short Term Goal 3 (Week 1): Pt will perform least restrictive transfer with max assist x 1 PT Short Term Goal 3 - Progress (Week 1): Met PT Short Term Goal 4 (Week 1): Pt will propel manual w/c x 50 ft with CGA PT Short Term Goal 4 - Progress (Week 1): Met Week 2:  PT Short Term Goal 1 (Week 2): Pt will demonstrate sit<>stand with RW and max assist +1 PT Short Term Goal 2 (Week 2): pt will initiate pre-gait training with PT PT Short Term Goal 3 (Week 2): Pt will propel manual w/c x50' with supervision Week 3:     Skilled Therapeutic Interventions/Progress Updates:   Pt received sitting in WC and agreeable to PT  Pt transported to day room. Kinetron reciprocal endurance training 5 bouts x 45sec with prolonged rest break between bouts. Min assist to stabilize the LLE .   Stedy transfer to bed from [facility] with min assist to power up into standing. Min cues for improved anterior weight shift and UE placement.   Bed mobility for sit>supine with mod assist for PT to control BLE. Min cues to maintain back precautions .  Pt left supine in bed with call bell in reach.        Therapy Documentation Precautions:  Precautions Precautions: Fall, Back Precaution Booklet Issued: No Precaution Comments: Pt needed min cues to recall 3/3 back precautions Required Braces or Orthoses: Spinal Brace Spinal Brace: Thoracolumbosacral orthotic, Applied in sitting position Restrictions Weight Bearing  Restrictions: No Vital Signs: Therapy Vitals Temp: 98.5 F (36.9 C) Temp Source: Oral Pulse Rate: (!) 102 Resp: 18 BP: 130/81 Patient Position (if appropriate): Sitting Oxygen Therapy SpO2: 99 % O2 Device: Room Air Pain: Pain Assessment Pain Score: 9  Pain Type: Acute pain Pain Location: Back Pain Descriptors / Indicators: Aching;Discomfort Pain Intervention(s): Medication (See eMAR)  See Function Navigator for Current Functional Status.   Therapy/Group: Individual Therapy  Lorie Phenix 04/07/2018, 3:12 PM

## 2018-04-08 ENCOUNTER — Inpatient Hospital Stay (HOSPITAL_COMMUNITY): Payer: Medicare Other | Admitting: Occupational Therapy

## 2018-04-08 ENCOUNTER — Inpatient Hospital Stay (HOSPITAL_COMMUNITY): Payer: Federal, State, Local not specified - PPO

## 2018-04-08 NOTE — Progress Notes (Signed)
Occupational Therapy Session Note  Patient Details  Name: Cody Velez MRN: [id number] Date of Birth: 1946/05/30  Today's Date: 04/08/2018 OT Individual Time: 1015-1100 OT Individual Time Calculation (min): 45 min    Short Term Goals: Week 2:  OT Short Term Goal 1 (Week 2): Pt will perform BSC squat pivot transfer with max A. OT Short Term Goal 2 (Week 2): Pt will thread pants over BLE sitting EOB using AE PRN with max A OT Short Term Goal 3 (Week 2): Pt will pull up pants sit<>stand in Stedy with max A  Skilled Therapeutic Interventions/Progress Updates:    Pt received supine in bed agreeable to therapy. Session focused on functional activity tolerance in standing/sitting, ADL transfers, and pain management. Pt completed UB bathing with set up at bed level. Pt urgently requested to use BSC. Pt transferred to EOB from supine with HOB at 35 degrees with CGA. TLSO donned with max A, however pt I with caregiver instructions re donning. Manual facilitation required to position B LE evenly on floor to increase BOS. Vc for technique/initiation provided during sit to stand in stedy with CGA. Pt was transferred to bari-BSC and required several vc/tactile cues for positioning comfortably on seat. Pt provided with increased time d/t attempting to void bowels, however was ultimately unsuccessful. Pt completed LB bathing from [facility] with mod A. Pt attempting to assist with LB dressing, releasing 1 UE from stedy during stand to pull up brief. Pt transferred back to bed and required total A for lifting B LE into bed. Pt left with bed alarm set and all needs met.   Therapy Documentation Precautions:  Precautions Precautions: Fall, Back Precaution Booklet Issued: No Precaution Comments: Pt needed min cues to recall 3/3 back precautions Required Braces or Orthoses: Spinal Brace Spinal Brace: Thoracolumbosacral orthotic, Applied in sitting position Restrictions Weight Bearing Restrictions: No Pain: Pain  Assessment Pain Scale: 0-10 Pain Score: 3  Pain Type: Acute pain;Surgical pain Pain Location: Back Pain Orientation: Mid Pain Descriptors / Indicators: Aching Pain Frequency: Constant Pain Onset: On-going Pain Intervention(s): Repositioned ADL: ADL ADL Comments: Please see functional navigator  See Function Navigator for Current Functional Status.   Therapy/Group: Individual Therapy  Curtis Sites 04/08/2018, 12:42 PM

## 2018-04-08 NOTE — Progress Notes (Signed)
Subjective/Complaints: Patient can now lift his left leg off the bed, is overall pleased with progress  ROS: Patient denies fever, rash, sore throat, blurred vision, nausea, vomiting, diarrhea, cough, shortness of breath or chest pain, joint or back pain, headache, or mood change.    Objective: Vital Signs: Blood pressure 132/86, pulse 97, temperature 98.5 F (36.9 C), temperature source Oral, resp. rate 17, height 6\' 3"  (1.905 m), weight 127.5 kg (281 lb 1.4 oz), SpO2 93 %. No results found. Results for orders placed or performed during the hospital encounter of 03/30/18 (from the past 72 hour(s))  Urinalysis, Complete w Microscopic     Status: Abnormal   Collection Time: 04/05/18  4:21 PM  Result Value Ref Range   Color, Urine AMBER (A) YELLOW    Comment: BIOCHEMICALS MAY BE AFFECTED BY COLOR   APPearance CLEAR CLEAR   Specific Gravity, Urine 1.029 1.005 - 1.030   pH 5.0 5.0 - 8.0   Glucose, UA NEGATIVE NEGATIVE mg/dL   Hgb urine dipstick NEGATIVE NEGATIVE   Bilirubin Urine MODERATE (A) NEGATIVE   Ketones, ur NEGATIVE NEGATIVE mg/dL   Protein, ur 30 (A) NEGATIVE mg/dL   Nitrite NEGATIVE NEGATIVE   Leukocytes, UA NEGATIVE NEGATIVE   RBC / HPF 0-5 0 - 5 RBC/hpf   WBC, UA 0-5 0 - 5 WBC/hpf   Bacteria, UA NONE SEEN NONE SEEN   Squamous Epithelial / LPF 0-5 (A) NONE SEEN   Mucus PRESENT    Hyaline Casts, UA PRESENT     Comment: Performed at [facility], [address] [place], [place] [address]  Culture, Urine     Status: None   Collection Time: 04/05/18  5:56 PM  Result Value Ref Range   Specimen Description URINE, RANDOM    Special Requests NONE    Culture      NO GROWTH Performed at [facility], [place] [address], [place], [place] [address]    Report Status 04/06/2018 FINAL      Constitutional: No distress . Vital signs reviewed. HEENT: EOMI, oral membranes moist Cardiovascular: RRR without murmur. No JVD    Respiratory: CTA Bilaterally without wheezes  or rales. Normal effort    GI: BS +, non-tender, non-distended  Neuro: Alert and Oriented Sensation diminished to light touch below umbilicus Motor: 5/5 in BUE proximal to distal RLE: hip flexion 2+/5, knee extension 3+/5, ankle dorsiflexion 3+ tp 4/5 LLE: Hip flexion  1+ to 2/5, KE 3/5 ankle dorsiflexion 3 -/5--neuro  Musc/Skel: crepitus, pain right knee with ROM Skin: back incision intact       Assessment/Plan: 1. Functional deficits secondary to paraplegia secondary to epidural abscess which require 3+ hours per day of interdisciplinary therapy in a comprehensive inpatient rehab setting. Physiatrist is providing close team supervision and 24 hour management of active medical problems listed below. Physiatrist and rehab team continue to assess barriers to discharge/monitor patient progress toward functional and medical goals. FIM: Function - Bathing Bathing activity did not occur: Refused Position: Bed Body parts bathed by patient: Right arm, Left arm, Chest, Abdomen, Front perineal area, Right upper leg, Left upper leg Body parts bathed by helper: Left lower leg, Back, Buttocks, Right lower leg Bathing not applicable: Front perineal area Assist Level: Touching or steadying assistance(Pt > 75%)  Function- Upper Body Dressing/Undressing What is the patient wearing?: Pull over shirt/dress Pull over shirt/dress - Perfomed by patient: Thread/unthread right sleeve, Thread/unthread left sleeve, Put head through opening, Pull shirt over trunk Pull  over shirt/dress - Perfomed by helper: Pull shirt over trunk Orthosis activity level: Performed by helper Assist Level: Supervision or verbal cues Function - Lower Body Dressing/Undressing What is the patient wearing?: Pants, Non-skid slipper socks Position: Bed Pants- Performed by patient: Pull pants up/down Pants- Performed by helper: Thread/unthread right pants leg, Thread/unthread left pants leg Non-skid slipper socks- Performed by  helper: Don/doff right sock, Don/doff left sock Assist for footwear: Dependant Assist for lower body dressing: Touching or steadying assistance (Pt > 75%)  Function - Toileting Toileting activity did not occur: No continent bowel/bladder event Toileting steps completed by patient: Adjust clothing after toileting Toileting steps completed by helper: Adjust clothing prior to toileting, Performs perineal hygiene Assist level: Touching or steadying assistance (Pt.75%)  Function - Air cabin crew transfer activity did not occur: N/A Toilet transfer assistive device: Mechanical lift Mechanical lift: Stedy Assist level to toilet: Dependent (Pt equals 0%) Assist level from toilet: Dependent (Pt equals 0%)  Function - Chair/bed transfer Chair/bed transfer activity did not occur: N/A Chair/bed transfer method: Other(stedy) Chair/bed transfer assist level: dependent (Pt equals 0%) Chair/bed transfer assistive device: Mechanical lift Mechanical lift: Stedy Chair/bed transfer details: Verbal cues for technique, Verbal cues for precautions/safety, Verbal cues for safe use of DME/AE, Manual facilitation for placement, Manual facilitation for weight shifting, Manual facilitation for weight bearing  Function - Locomotion: Wheelchair Will patient use wheelchair at discharge?: Yes Type: Manual Wheelchair activity did not occur: N/A Max wheelchair distance: 150' Assist Level: Supervision or verbal cues Wheel 50 feet with 2 turns activity did not occur: N/A Assist Level: Supervision or verbal cues Wheel 150 feet activity did not occur: N/A Assist Level: Supervision or verbal cues Turns around,maneuvers to table,bed, and toilet,negotiates 3% grade,maneuvers on rugs and over doorsills: No Function - Locomotion: Ambulation Ambulation activity did not occur: N/A Walk 10 feet activity did not occur: N/A Walk 50 feet with 2 turns activity did not occur: N/A Walk 150 feet activity did not occur:  N/A Walk 10 feet on uneven surfaces activity did not occur: N/A  Function - Comprehension Comprehension: Auditory Comprehension assist level: Follows complex conversation/direction with no assist  Function - Expression Expression: Verbal Expression assist level: Expresses complex ideas: With no assist  Function - Social Interaction Social Interaction assist level: Interacts appropriately with others - No medications needed.  Function - Problem Solving Problem solving assist level: Solves complex problems: Recognizes & self-corrects  Function - Memory Memory assist level: Complete Independence: No helper Patient normally able to recall (first 3 days only): Current season, Location of own room, Staff names and faces, That he or she is in a hospital  Medical Problem List and Plan: 1.Decreased functional mobilitysecondary to T10-11 osteomyelitis/epidural abscess with subsequent myelopathy/paraplegia, status post decompression and fusion T8-L3. Back brace when out of bed. Patient with history of multiple lumbar surgeries as well as spinal cord stimulator January 2019   Continue PT, OT therapies.   2. DVT left soleal, right peroneal DVT's:.     prophylactic doses of Lovenox started on 4/12.    Repeat dopplers over the next two weeks 3. Pain Management:Neurontin 600 mg 3 times daily, Robaxin   as needed   Lidoderm patch started on 4/13   -scheduled tylenol  -oxycodone prn.  -scapular pain improved  -pt continues to work through pain 4. Mood:Aricept 10 mg nightly, Namenda 10 mg twice daily, Effexor sore 150 mg twice daily, trazodone 100 mg nightly 5. Neuropsych: This patientisfully capable of making decisions on  hisown behalf. 6. Skin/Wound Care:staples out per NS 7. Fluids/Electrolytes/Nutrition:Routine I/O's  -supplementing potassium    - Appetite seems to be improving    -appreciate RD consult.  8.ID/MSSA bacteremia.Intravenous Ancef 2 g every 8 hours through  05/22/2018 and stop as well as rifampin 300 mg twice daily.    -outpt Follow-up per infectious disease 9.Acute on chronic anemia.     Hemoglobin 8 on 4/15 (stable)---recheck next week    10.Hypertension. Norvasc 10 mg daily   Vitals:   04/07/18 1435 04/08/18 0350  BP: 130/81 132/86  Pulse: (!) 102 97  Resp: 18 17  Temp: 98.5 F (36.9 C) 98.5 F (36.9 C)  SpO2: 99% 93%   11.Hyperlipidemia. Pravachol 12.History of prostate cancer. Proscar 5 mg daily 13.Constipation. Laxative assistance, +bm 14. Urine retention, occasional incontinence seems to be improving  ---Ua negative, ucx negative  LOS (Days) 9 A FACE TO FACE EVALUATION WAS PERFORMED  Charlett Blake 04/08/2018, 12:03 PM

## 2018-04-08 NOTE — Progress Notes (Signed)
Pt has home unit and places self on/off.  Rt will monitor.

## 2018-04-08 NOTE — Progress Notes (Signed)
Occupational Therapy Session Note  Patient Details  Name: Cody Velez MRN: [id number] Date of Birth: 1946/10/02  Today's Date: 04/08/2018 OT Individual Time: 1400-1445 OT Individual Time Calculation (min): 45 min    Short Term Goals: Week 1:  OT Short Term Goal 1 (Week 1): Pt will tolerate sitting EOB for 5 mins in preparation for BADL tasks. OT Short Term Goal 2 (Week 1): Pt will direct care to don TLSO with min A from caregiver OT Short Term Goal 3 (Week 1): Pt will maintain sitting balance at EOB with no more than mod A in preparation for BADL tasks OT Short Term Goal 4 (Week 1): Pt will recall 3/3 back precautions without cues Week 2:  OT Short Term Goal 1 (Week 2): Pt will perform BSC squat pivot transfer with max A. OT Short Term Goal 2 (Week 2): Pt will thread pants over BLE sitting EOB using AE PRN with max A OT Short Term Goal 3 (Week 2): Pt will pull up pants sit<>stand in Stedy with max A  Skilled Therapeutic Interventions/Progress Updates:    Pt agreed to OT therapy session.    Focus of treatment was bed mobility, tansfers, sitting balance, postural control.   Attempled to don shorts while in supine with HOB at 45 degrees and crossed leg technique.  Pt able to pull from knees to upper thigh.   Went from supine to Sit to EOB with min assist.  Performed AROM to BUE while maintaining  sitting balance.  Needed close supervision for sitting balance. Ppt donned TLSO with questioning cues about technique while sitting EOB.      Able to maintain minimally challenging sitting balance activities with close supervision.  Returned to bed at end of session per pt request  Therapy Documentation Precautions:  Precautions Precautions: Fall, Back Precaution Booklet Issued: No Precaution Comments: Pt needed min cues to recall 3/3 back precautions Required Braces or Orthoses: Spinal Brace Spinal Brace: Thoracolumbosacral orthotic, Applied in sitting position Restrictions Weight Bearing  Restrictions: No    Vital Signs: Therapy Vitals Temp: 98.6 F (37 C) Temp Source: Oral Pulse Rate: 90 Resp: 18 BP: 139/74 Patient Position (if appropriate): Sitting Oxygen Therapy SpO2: 97 % O2 Device: Room Air Pain: Pain Assessment Pain Scale: 0-10 Pain Score: 6 Pain Type: Chronic pain Pain Location: Back Pain Orientation: Lower Pain Descriptors / Indicators: Aching;Dull;Discomfort Pain Frequency: Constant Pain Onset: On-going Pain Intervention(s): Medication (See eMAR)  ADL ADL Comments: Please see functional navigator  See Function Navigator for Current Functional Status.   Therapy/Group: Individual Therapy  Lisa Roca 04/08/2018, 6:26 PM

## 2018-04-09 ENCOUNTER — Inpatient Hospital Stay (HOSPITAL_COMMUNITY): Payer: Federal, State, Local not specified - PPO | Admitting: Physical Therapy

## 2018-04-09 NOTE — Progress Notes (Signed)
Subjective/Complaints: Sitting in wheelchair, still feels numbness in the feet but feels like this has improved  ROS: Patient denies fever, rash, sore throat, blurred vision, nausea, vomiting, diarrhea, cough, shortness of breath or chest pain, joint or back pain, headache, or mood change.    Objective: Vital Signs: Blood pressure (!) 152/94, pulse 92, temperature 98.1 F (36.7 C), temperature source Oral, resp. rate 18, height 6\' 3"  (1.905 m), weight 126.5 kg (278 lb 14.1 oz), SpO2 95 %. No results found. No results found for this or any previous visit (from the past 72 hour(s)).   Constitutional: No distress . Vital signs reviewed. HEENT: EOMI, oral membranes moist Cardiovascular: RRR without murmur. No JVD    Respiratory: CTA Bilaterally without wheezes or rales. Normal effort    GI: BS +, non-tender, non-distended  Neuro: Alert and Oriented Sensation diminished to light touch below umbilicus Motor: 5/5 in BUE proximal to distal RLE: hip flexion 2+/5, knee extension 3+/5, ankle dorsiflexion 3+ tp 4/5 LLE: Hip flexion  1+ to 2/5, KE 3/5 ankle dorsiflexion 3 -/5--neuro  Musc/Skel: crepitus, pain right knee with ROM Skin: back incision intact       Assessment/Plan: 1. Functional deficits secondary to paraplegia secondary to epidural abscess which require 3+ hours per day of interdisciplinary therapy in a comprehensive inpatient rehab setting. Physiatrist is providing close team supervision and 24 hour management of active medical problems listed below. Physiatrist and rehab team continue to assess barriers to discharge/monitor patient progress toward functional and medical goals. FIM: Function - Bathing Bathing activity did not occur: Refused Position: Bed Body parts bathed by patient: Right arm, Left arm, Chest, Abdomen, Front perineal area, Right upper leg, Left upper leg Body parts bathed by helper: Left lower leg, Back, Buttocks, Right lower leg Bathing not applicable:  Front perineal area Assist Level: Touching or steadying assistance(Pt > 75%)  Function- Upper Body Dressing/Undressing What is the patient wearing?: Pull over shirt/dress Pull over shirt/dress - Perfomed by patient: Thread/unthread right sleeve, Thread/unthread left sleeve, Put head through opening, Pull shirt over trunk Pull over shirt/dress - Perfomed by helper: Pull shirt over trunk Orthosis activity level: Performed by helper Assist Level: Supervision or verbal cues Function - Lower Body Dressing/Undressing What is the patient wearing?: Pants Position: Bed Pants- Performed by patient: Pull pants up/down Pants- Performed by helper: Thread/unthread right pants leg, Thread/unthread left pants leg Non-skid slipper socks- Performed by helper: Don/doff right sock, Don/doff left sock Assist for footwear: Dependant Assist for lower body dressing: Touching or steadying assistance (Pt > 75%)  Function - Toileting Toileting activity did not occur: No continent bowel/bladder event Toileting steps completed by patient: Adjust clothing after toileting Toileting steps completed by helper: Adjust clothing prior to toileting, Performs perineal hygiene Assist level: Touching or steadying assistance (Pt.75%)  Function - Air cabin crew transfer activity did not occur: N/A Toilet transfer assistive device: Mechanical lift Mechanical lift: Stedy Assist level to toilet: Dependent (Pt equals 0%) Assist level from toilet: Dependent (Pt equals 0%)  Function - Chair/bed transfer Chair/bed transfer activity did not occur: N/A Chair/bed transfer method: Other(stedy) Chair/bed transfer assist level: dependent (Pt equals 0%) Chair/bed transfer assistive device: Mechanical lift Mechanical lift: Stedy Chair/bed transfer details: Verbal cues for technique, Verbal cues for precautions/safety, Verbal cues for safe use of DME/AE, Manual facilitation for placement, Manual facilitation for weight  shifting, Manual facilitation for weight bearing  Function - Locomotion: Wheelchair Will patient use wheelchair at discharge?: Yes Type: Manual Wheelchair activity did not  occur: N/A Max wheelchair distance: 150' Assist Level: Supervision or verbal cues Wheel 50 feet with 2 turns activity did not occur: N/A Assist Level: Supervision or verbal cues Wheel 150 feet activity did not occur: N/A Assist Level: Supervision or verbal cues Turns around,maneuvers to table,bed, and toilet,negotiates 3% grade,maneuvers on rugs and over doorsills: No Function - Locomotion: Ambulation Ambulation activity did not occur: N/A Walk 10 feet activity did not occur: N/A Walk 50 feet with 2 turns activity did not occur: N/A Walk 150 feet activity did not occur: N/A Walk 10 feet on uneven surfaces activity did not occur: N/A  Function - Comprehension Comprehension: Auditory Comprehension assist level: Follows complex conversation/direction with no assist  Function - Expression Expression: Verbal Expression assist level: Expresses complex ideas: With no assist  Function - Social Interaction Social Interaction assist level: Interacts appropriately with others - No medications needed.  Function - Problem Solving Problem solving assist level: Solves complex problems: Recognizes & self-corrects  Function - Memory Memory assist level: Complete Independence: No helper Patient normally able to recall (first 3 days only): Current season, Location of own room, Staff names and faces, That he or she is in a hospital  Medical Problem List and Plan: 1.Decreased functional mobilitysecondary to T10-11 osteomyelitis/epidural abscess with subsequent myelopathy/paraplegia, status post decompression and fusion T8-L3. Back brace when out of bed. Patient with history of multiple lumbar surgeries as well as spinal cord stimulator January 2019   Continue PT, OT therapies.   2. DVT left soleal, right peroneal DVT's:.      prophylactic doses of Lovenox started on 4/12.    Repeat dopplers over the next two weeks 3. Pain Management:Neurontin 600 mg 3 times daily, Robaxin   as needed   Lidoderm patch started on 4/13   -scheduled tylenol  -oxycodone prn.  -scapular pain improved  -pt continues to work through pain 4. Mood:Aricept 10 mg nightly, Namenda 10 mg twice daily, Effexor sore 150 mg twice daily, trazodone 100 mg nightly 5. Neuropsych: This patientisfully capable of making decisions on hisown behalf. 6. Skin/Wound Care:staples out per NS 7. Fluids/Electrolytes/Nutrition:Routine I/O's  -supplementing potassium KCl 20 mEq/day recheck metabolic package in a.m.   - Appetite seems to be improving    -appreciate RD consult.  8.ID/MSSA bacteremia.Intravenous Ancef 2 g every 8 hours through 05/22/2018 and stop as well as rifampin 300 mg twice daily.    -outpt Follow-up per infectious disease 9.Acute on chronic anemia.     Hemoglobin 8 on 4/15 (stable)---recheck next week    10.Hypertension. Norvasc 10 mg daily   Vitals:   04/08/18 1500 04/09/18 0153  BP: 139/74 (!) 152/94  Pulse: 90 92  Resp: 18 18  Temp: 98.6 F (37 C) 98.1 F (36.7 C)  SpO2: 97% 95%  Elevated this a.m. we will continue to monitor before any dosage changes 11.Hyperlipidemia. Pravachol 12.History of prostate cancer. Proscar 5 mg daily 13.Constipation. Laxative assistance, +bm 14. Urine retention, occasional incontinence seems to be improving  ---Ua negative, ucx negative  LOS (Days) 10 A FACE TO FACE EVALUATION WAS PERFORMED  Charlett Blake 04/09/2018, 9:36 AM

## 2018-04-09 NOTE — Progress Notes (Signed)
Occupational Therapy Session Note  Patient Details  Name: Cody Velez MRN: [id number] Date of Birth: Nov 22, 1946  Today's Date: 04/09/2018 OT Individual Time: 4196-2229 OT Individual Time Calculation (min): 44 min    Short Term Goals: Week 2:  OT Short Term Goal 1 (Week 2): Pt will perform BSC squat pivot transfer with max A. OT Short Term Goal 2 (Week 2): Pt will thread pants over BLE sitting EOB using AE PRN with max A OT Short Term Goal 3 (Week 2): Pt will pull up pants sit<>stand in Stedy with max A  Skilled Therapeutic Interventions/Progress Updates:    Pt resting in w/c upon arrival.  OT intervention with focus on bathing at w/c level and dressing with sit<>stand from w/c with Stedy.  Pt completed UB bathing with setup and declined LB bathing this morning.  Pt able to thread pants over BLE with use of reacher.  Pt stood in [place] without assistance but required assistance pulling pants over hips.  Pt required support of BUE while standing.  Pt performed sit>supine in bed with max A for placing BLE onto bed.  Pt assisted with repositioning in bed.  Pt remained in bed with all needs within reach.    Therapy Documentation Precautions:  Precautions Precautions: Fall, Back Precaution Booklet Issued: No Precaution Comments: Pt needed min cues to recall 3/3 back precautions Required Braces or Orthoses: Spinal Brace Spinal Brace: Thoracolumbosacral orthotic, Applied in sitting position Restrictions Weight Bearing Restrictions: No Pain: Pain Assessment Pain Score: 10-Worst pain ever Pain Type: Acute pain;Surgical pain Pain Location: Back Pain Descriptors / Indicators: Aching Pain Intervention(s): Medication (See eMAR)  See Function Navigator for Current Functional Status.   Therapy/Group: Individual Therapy  [name] 04/09/2018, 10:01 AM

## 2018-04-10 ENCOUNTER — Inpatient Hospital Stay (HOSPITAL_COMMUNITY): Payer: Federal, State, Local not specified - PPO | Admitting: Physical Therapy

## 2018-04-10 ENCOUNTER — Inpatient Hospital Stay (HOSPITAL_COMMUNITY): Payer: Federal, State, Local not specified - PPO

## 2018-04-10 ENCOUNTER — Inpatient Hospital Stay (HOSPITAL_COMMUNITY): Payer: Medicare Other

## 2018-04-10 LAB — BASIC METABOLIC PANEL
Anion gap: 6 (ref 5–15)
BUN: 6 mg/dL (ref 6–20)
CO2: 27 mmol/L (ref 22–32)
Calcium: 8.6 mg/dL — ABNORMAL LOW (ref 8.9–10.3)
Chloride: 102 mmol/L (ref 101–111)
Creatinine, Ser: 0.73 mg/dL (ref 0.61–1.24)
GFR calc Af Amer: >60 mL/min (ref >60)
GFR calc non Af Amer: >60 mL/min (ref >60)
Glucose, Bld: 87 mg/dL (ref 65–99)
Potassium: 3.8 mmol/L (ref 3.5–5.1)
Sodium: 135 mmol/L (ref 135–145)

## 2018-04-10 MED ORDER — SENNOSIDES-DOCUSATE SODIUM 8.6-50 MG PO TABS
2.0000 | ORAL_TABLET | Freq: Every day | ORAL | Status: DC
Start: 2018-04-10 — End: 2018-04-21
  Administered 2018-04-10 – 2018-04-20 (×10): 2 via ORAL
  Filled 2018-04-10 (×11): qty 2

## 2018-04-10 NOTE — Progress Notes (Signed)
Physical Therapy Session Note  Patient Details  Name: Cody Velez MRN: [id number] Date of Birth: 12-14-1946  Today's Date: 04/10/2018 PT Individual Time: 1015-1100 and 1300-1400 PT Individual Time Calculation (min): 45 min and 60 min   Short Term Goals: Week 2:  PT Short Term Goal 1 (Week 2): Pt will demonstrate sit<>stand with RW and max assist +1 PT Short Term Goal 2 (Week 2): pt will initiate pre-gait training with PT PT Short Term Goal 3 (Week 2): Pt will propel manual w/c x50' with supervision  Skilled Therapeutic Interventions/Progress Updates:  Session 1: Pt missed first 15 minutes of session due to nursing student still passing pain medication.  Pt reporting having asked for medication at 5 AM and was only just receiving it.  Provided emotional support, monitored pain during session, and adjusted activity according to pain levels.  Pt reporting pain at 9/10 immediately following medication.  Session focus on activity tolerance and maintaining back precautions during functional mobility.  Pt completed supine>sit with overall min assist with tactile and verbal cues for maintaining back precautions during log roll.  Pt with some difficulty keeping pelvis from rotating posteriorly compared to shoulders during rolling.  TLSO donned with total assist at EOB.  Sit<>stand with stedy throughout session with no more than steady assist and min verbal cues for pushing up with LEs and relying less on UEs.  Pt continues to be unable to propel w/c 2/2 no push rim on L drive wheel.  PT continues to follow up on this.  In therapy gym pt completes table top activity while in supported standing in [place], and able to tolerate position x5 minutes +2 minutes with extended rest break in between.  Discussion during rest periods this session focus on continuing to progress activity tolerance, out of bed tolerance, manage pain, and d/c planning.  Pt returned to room at end of session and positioned back in bed to  rest prior to afternoon sessions.  Requires supervision for transfer w/c>bed with stedy, and mod assist to lift LEs into bed with mod multimodal cues for maintaining back precautions.  Also discussed with pt potentially sleeping in his recliner at d/c if bed mobility continues to be difficult and painful.  Pt positioned to comfort with call bell in reach and needs met.   Session 2:  Pt continues to report increased levels of pain (7/10) and RN in at start of session for medication.  Pt continues to require min assist for supine>sit and mod cues for back precautions.  TLSO donned in sitting total assist.  Use of stedy for transfer to w/c with supervision for sit<>stand from EOB and from elevated stedy seat.  Pt taken outside for therapeutic change of scenery, with focus on LE therex and discussion regarding pt progress towards goals and future plan for therapy session.  PT instructs pt in 2x10 reps of BLE therex as follows: heel/toe raises, LAQ (rest at rep 6 on LLE due to muscle fatigue), and hip flexion.  PT discussed pt's excellent progress with strength in LEs and plan to progress towards standing tolerance with less restrictive RW in the next session.  Pt returned to room at end of session and positioned upright in w/c to await next PT session, call bell in reach and needs met.    Therapy Documentation Precautions:  Precautions Precautions: Fall, Back Precaution Booklet Issued: No Precaution Comments: Pt needed min cues to recall 3/3 back precautions Required Braces or Orthoses: Spinal Brace Spinal Brace: Thoracolumbosacral orthotic,  Applied in sitting position Restrictions Weight Bearing Restrictions: No General: PT Amount of Missed Time (min): 15 Minutes PT Missed Treatment Reason: Nursing care   See Function Navigator for Current Functional Status.   Therapy/Group: Individual Therapy  Michel Santee 04/10/2018, 11:07 AM

## 2018-04-10 NOTE — Plan of Care (Signed)
  Problem: Consults Goal: RH SPINAL CORD INJURY PATIENT EDUCATION Description Pt able to verbalize and initiate education regarding spinal cord injury with MOD I assist   Outcome: Progressing   Problem: SCI BOWEL ELIMINATION Goal: RH STG MANAGE BOWEL WITH ASSISTANCE Description STG Manage Bowel with min Assistance.  Outcome: Progressing Goal: RH STG SCI MANAGE BOWEL WITH MEDICATION WITH ASSISTANCE Description STG SCI Manage bowel with medication with Min assistance.  Outcome: Progressing   Problem: SCI BLADDER ELIMINATION Goal: RH STG MANAGE BLADDER WITH ASSISTANCE Description STG Manage Bladder With min Assistance  Outcome: Progressing   Problem: RH SKIN INTEGRITY Goal: RH STG SKIN FREE OF INFECTION/BREAKDOWN Description Skin will remain free from breakdown with min assist  Outcome: Progressing Goal: RH STG MAINTAIN SKIN INTEGRITY WITH ASSISTANCE Description STG Maintain Skin Integrity With min Assistance.  Outcome: Progressing   Problem: RH SAFETY Goal: RH STG ADHERE TO SAFETY PRECAUTIONS W/ASSISTANCE/DEVICE Description STG Adhere to Safety Precautions With min  Assistance/Device.  Outcome: Progressing   Problem: RH PAIN MANAGEMENT Goal: RH STG PAIN MANAGED AT OR BELOW PT'S PAIN GOAL Description Pain goal is less than 3 with min assist of PRN medications  Outcome: Progressing

## 2018-04-10 NOTE — Progress Notes (Signed)
Occupational Therapy Session Note  Patient Details  Name: Cody Velez MRN: [id number] Date of Birth: 1946-02-28  Today's Date: 04/10/2018 OT Individual Time: 6440-3474 OT Individual Time Calculation (min): 75 min  Pt missed 15 mins secondary to fatigue   Short Term Goals: Week 2:  OT Short Term Goal 1 (Week 2): Pt will perform BSC squat pivot transfer with max A. OT Short Term Goal 2 (Week 2): Pt will thread pants over BLE sitting EOB using AE PRN with max A OT Short Term Goal 3 (Week 2): Pt will pull up pants sit<>stand in Stedy with max A  Skilled Therapeutic Interventions/Progress Updates:    OT intervention with focus on bed mobility, sit<>stand with Stedy, standing balance with Stedy, bathing at shower level, and dressing at w/c level and bed level to increase independence with BADLs. Pt erquired min A for moving BLE off EOB and min A for sidelying>sit EOB.  Pt performed sit<>stand with Stedy a supervision level.  Pt required assistance with bathing buttocks and B lower legs. Pt attempted to remain standing to don pants but c/o increased fatigue and pain near end of session and returned to bed to complete LB dressing.  Pt rolls in bed with min A using bed rails. Pt remained in bed with all needs within reach.  Therapy Documentation Precautions:  Precautions Precautions: Fall, Back Precaution Booklet Issued: No Precaution Comments: Pt needed min cues to recall 3/3 back precautions Required Braces or Orthoses: Spinal Brace Spinal Brace: Thoracolumbosacral orthotic, Applied in sitting position Restrictions Weight Bearing Restrictions: No Pain:  Pt c/o 7/10 back pain at beginning of session and 8/10 after activity; repositioned  See Function Navigator for Current Functional Status.   Therapy/Group: Individual Therapy  [name] 04/10/2018, 9:58 AM

## 2018-04-10 NOTE — Progress Notes (Signed)
Subjective/Complaints: Patient up in bed.  No new issues today.  Family visited yesterday which he enjoyed.  Has not moved bowels since Friday  ROS: Patient denies fever, rash, sore throat, blurred vision, nausea, vomiting, diarrhea, cough, shortness of breath or chest pain, joint or back pain, headache, or mood change.    Objective: Vital Signs: Blood pressure 129/88, pulse 87, temperature 98.1 F (36.7 C), temperature source Oral, resp. rate 17, height '6\' 3"'  (1.905 m), weight 129.3 kg (285 lb 0.9 oz), SpO2 97 %. No results found. Results for orders placed or performed during the hospital encounter of 03/30/18 (from the past 72 hour(s))  Basic metabolic panel     Status: Abnormal   Collection Time: 04/10/18  4:44 AM  Result Value Ref Range   Sodium 135 135 - 145 mmol/L   Potassium 3.8 3.5 - 5.1 mmol/L   Chloride 102 101 - 111 mmol/L   CO2 27 22 - 32 mmol/L   Glucose, Bld 87 65 - 99 mg/dL   BUN 6 6 - 20 mg/dL   Creatinine, Ser 0.73 0.61 - 1.24 mg/dL   Calcium 8.6 (L) 8.9 - 10.3 mg/dL   GFR calc non Af Amer >60 >60 mL/min   GFR calc Af Amer >60 >60 mL/min    Comment: (NOTE) The eGFR has been calculated using the CKD EPI equation. This calculation has not been validated in all clinical situations. eGFR's persistently <60 mL/min signify possible Chronic Kidney Disease.    Anion gap 6 5 - 15    Comment: Performed at [facility] [address] [place], [place] [address]     Constitutional: No distress . Vital signs reviewed. HEENT: EOMI, oral membranes moist Cardiovascular: RRR without murmur. No JVD    Respiratory: CTA Bilaterally without wheezes or rales. Normal effort    GI: BS +, non-tender, non-distended  Neuro: Alert and Oriented Sensation diminished to light touch below umbilicus Motor: 5/5 in BUE proximal to distal RLE: hip flexion 2+/5, knee extension 3+/5, ankle dorsiflexion 3+ tp 4/5 LLE: Hip flexion  1+ to 2/5, KE 3/5 ankle dorsiflexion 3 -/5--neuro   Musc/Skel: crepitus, pain right knee with ROM Skin: back incision intact       Assessment/Plan: 1. Functional deficits secondary to paraplegia secondary to epidural abscess which require 3+ hours per day of interdisciplinary therapy in a comprehensive inpatient rehab setting. Physiatrist is providing close team supervision and 24 hour management of active medical problems listed below. Physiatrist and rehab team continue to assess barriers to discharge/monitor patient progress toward functional and medical goals. FIM: Function - Bathing Bathing activity did not occur: (UB bathing only) Position: Wheelchair/chair at sink Body parts bathed by patient: Right arm, Left arm, Chest, Abdomen, Front perineal area Body parts bathed by helper: Left lower leg, Back, Buttocks, Right lower leg Bathing not applicable: Buttocks, Right upper leg, Left upper leg, Right lower leg, Left lower leg, Back Assist Level: Set up  Function- Upper Body Dressing/Undressing What is the patient wearing?: Pull over shirt/dress Pull over shirt/dress - Perfomed by patient: Thread/unthread right sleeve, Thread/unthread left sleeve, Put head through opening, Pull shirt over trunk Pull over shirt/dress - Perfomed by helper: Pull shirt over trunk Orthosis activity level: Performed by helper Assist Level: Supervision or verbal cues Function - Lower Body Dressing/Undressing What is the patient wearing?: Pants, Non-skid slipper socks Position: Wheelchair/chair at sink Pants- Performed by patient: Thread/unthread right pants leg, Thread/unthread left pants leg Pants- Performed by helper: Pull pants up/down  Non-skid slipper socks- Performed by helper: Don/doff right sock, Don/doff left sock Assist for footwear: Dependant Assist for lower body dressing: 2 Helpers(stand with Stedy)  Function - Toileting Toileting activity did not occur: No continent bowel/bladder event Toileting steps completed by patient: Adjust clothing  after toileting Toileting steps completed by helper: Adjust clothing prior to toileting, Performs perineal hygiene Assist level: Touching or steadying assistance (Pt.75%)  Function - Air cabin crew transfer activity did not occur: N/A Toilet transfer assistive device: Mechanical lift Mechanical lift: Stedy Assist level to toilet: Dependent (Pt equals 0%) Assist level from toilet: Dependent (Pt equals 0%)  Function - Chair/bed transfer Chair/bed transfer activity did not occur: N/A Chair/bed transfer method: Other(stedy) Chair/bed transfer assist level: dependent (Pt equals 0%) Chair/bed transfer assistive device: Mechanical lift Mechanical lift: Stedy Chair/bed transfer details: Verbal cues for technique, Verbal cues for precautions/safety, Verbal cues for safe use of DME/AE, Manual facilitation for placement, Manual facilitation for weight shifting, Manual facilitation for weight bearing  Function - Locomotion: Wheelchair Will patient use wheelchair at discharge?: Yes Type: Manual Wheelchair activity did not occur: N/A Max wheelchair distance: 150' Assist Level: Supervision or verbal cues Wheel 50 feet with 2 turns activity did not occur: N/A Assist Level: Supervision or verbal cues Wheel 150 feet activity did not occur: N/A Assist Level: Supervision or verbal cues Turns around,maneuvers to table,bed, and toilet,negotiates 3% grade,maneuvers on rugs and over doorsills: No Function - Locomotion: Ambulation Ambulation activity did not occur: N/A Walk 10 feet activity did not occur: N/A Walk 50 feet with 2 turns activity did not occur: N/A Walk 150 feet activity did not occur: N/A Walk 10 feet on uneven surfaces activity did not occur: N/A  Function - Comprehension Comprehension: Auditory Comprehension assist level: Follows complex conversation/direction with no assist  Function - Expression Expression: Verbal Expression assist level: Expresses complex ideas: With no  assist  Function - Social Interaction Social Interaction assist level: Interacts appropriately with others - No medications needed.  Function - Problem Solving Problem solving assist level: Solves complex problems: Recognizes & self-corrects  Function - Memory Memory assist level: Complete Independence: No helper Patient normally able to recall (first 3 days only): Current season, Location of own room, Staff names and faces, That he or she is in a hospital  Medical Problem List and Plan: 1.Decreased functional mobilitysecondary to T10-11 osteomyelitis/epidural abscess with subsequent myelopathy/paraplegia, status post decompression and fusion T8-L3. Back brace when out of bed. Patient with history of multiple lumbar surgeries as well as spinal cord stimulator January 2019   Continue PT, OT therapies.   2. DVT left soleal, right peroneal DVT's:.     prophylactic doses of Lovenox started on 4/12.    Repeat dopplers over the next two weeks 3. Pain Management:Neurontin 600 mg 3 times daily, Robaxin   as needed   Lidoderm patch started on 4/13   -scheduled tylenol  -oxycodone prn.  -scapular pain improved  -Patient is working through pain 4. Mood:Aricept 10 mg nightly, Namenda 10 mg twice daily, Effexor sore 150 mg twice daily, trazodone 100 mg nightly 5. Neuropsych: This patientisfully capable of making decisions on hisown behalf. 6. Skin/Wound Care:staples out per NS 7. Fluids/Electrolytes/Nutrition:Routine I/O's  -supplementing potassium KCl 20 mEq/day.  Potassium 3.8 today 4/22  - Appetite  improving    -appreciate RD consult.  8.ID/MSSA bacteremia.Intravenous Ancef 2 g every 8 hours through 05/22/2018 and stop as well as rifampin 300 mg twice daily.    -outpt Follow-up per infectious  disease 9.Acute on chronic anemia.     Hemoglobin 8 on 4/15 (stable)---recheck this week    10.Hypertension. Norvasc 10 mg daily   Vitals:   04/09/18 1330 04/10/18 0209  BP:  139/81 129/88  Pulse: 93 87  Resp:  17  Temp: 97.8 F (36.6 C) 98.1 F (36.7 C)  SpO2: 98% 97%  Follow for consistent pattern 11.Hyperlipidemia. Pravachol 12.History of prostate cancer. Proscar 5 mg daily 13.Constipation. Laxative assistance, adjust regimen for more regular movements 14. Urine retention, occasional incontinence seems to be improving  ---Ua negative, ucx negative  LOS (Days) 11 A FACE TO FACE EVALUATION WAS PERFORMED  [name] 04/10/2018, 9:30 AM

## 2018-04-10 NOTE — Progress Notes (Signed)
PT has home CPAP at bedside.  RT added sterile water to water chamber and placed CPAP within reach for Pt.

## 2018-04-10 NOTE — Progress Notes (Signed)
Physical Therapy Session Note  Patient Details  Name: Cody Velez MRN: [id number] Date of Birth: 09/12/1946  Today's Date: 04/10/2018 PT Individual Time: 1430-1500 PT Individual Time Calculation (min): 30 min   Short Term Goals: Week 2:  PT Short Term Goal 1 (Week 2): Pt will demonstrate sit<>stand with RW and max assist +1 PT Short Term Goal 2 (Week 2): pt will initiate pre-gait training with PT PT Short Term Goal 3 (Week 2): Pt will propel manual w/c x50' with supervision  Skilled Therapeutic Interventions/Progress Updates:    Session focused on NMR for sit <> stand retraining and standing tolerance with RW with focus on technique and activation in BLE for full hip and knee extension. Completed x 3 full reps and 1 rep pt with knees giving out requiring total +2 to control descent into the seat of w/c. Other reps pt requires mod assist with +2 for safety and PT blocking and facilitating L knee extension. End of session used stedy with min/supervision to return back to bed and reposition in supine with mod assist for lifting of BLE. Cues for adherence to log roll technique for returning to supine as pt reports pain in back and noted that pt starting to rotate shoulders before allowing both LE's to get onto the bed (with assist). Pt's girlfriend present to observe session and very pleased with progress he has made also.   Therapy Documentation Precautions:  Precautions Precautions: Fall, Back Precaution Booklet Issued: No Precaution Comments: Pt needed min cues to recall 3/3 back precautions Required Braces or Orthoses: Spinal Brace Spinal Brace: Thoracolumbosacral orthotic, Applied in sitting position Restrictions Weight Bearing Restrictions: No Pain:  c/o pain on his bottom from sitting up in w/c. Returned to bed at end of session for rest.   See Function Navigator for Current Functional Status.   Therapy/Group: Individual Therapy  Canary Brim Ivory Broad, PT,  DPT  04/10/2018, 3:15 PM

## 2018-04-11 ENCOUNTER — Inpatient Hospital Stay (HOSPITAL_COMMUNITY): Payer: Federal, State, Local not specified - PPO

## 2018-04-11 ENCOUNTER — Ambulatory Visit: Payer: Federal, State, Local not specified - PPO | Admitting: Urology

## 2018-04-11 ENCOUNTER — Inpatient Hospital Stay (HOSPITAL_COMMUNITY): Payer: Federal, State, Local not specified - PPO | Admitting: Physical Therapy

## 2018-04-11 NOTE — Plan of Care (Signed)
  Problem: Consults Goal: RH SPINAL CORD INJURY PATIENT EDUCATION Description Pt able to verbalize and initiate education regarding spinal cord injury with MOD I assist   Outcome: Progressing Goal: Skin Care Protocol Initiated - if Braden Score 18 or less Description Pt will have improved skin breakdown while in rehab with min assist   Outcome: Progressing   Problem: SCI BOWEL ELIMINATION Goal: RH STG MANAGE BOWEL WITH ASSISTANCE Description STG Manage Bowel with min Assistance.  Outcome: Progressing Goal: RH STG SCI MANAGE BOWEL WITH MEDICATION WITH ASSISTANCE Description STG SCI Manage bowel with medication with Min assistance.  Outcome: Progressing   Problem: SCI BLADDER ELIMINATION Goal: RH STG MANAGE BLADDER WITH ASSISTANCE Description STG Manage Bladder With min Assistance  Outcome: Progressing   Problem: RH SKIN INTEGRITY Goal: RH STG SKIN FREE OF INFECTION/BREAKDOWN Description Skin will remain free from breakdown with min assist  Outcome: Progressing Goal: RH STG MAINTAIN SKIN INTEGRITY WITH ASSISTANCE Description STG Maintain Skin Integrity With min Assistance.  Outcome: Progressing   Problem: RH SAFETY Goal: RH STG ADHERE TO SAFETY PRECAUTIONS W/ASSISTANCE/DEVICE Description STG Adhere to Safety Precautions With min  Assistance/Device.  Outcome: Progressing   Problem: RH PAIN MANAGEMENT Goal: RH STG PAIN MANAGED AT OR BELOW PT'S PAIN GOAL Description Pain goal is less than 3 with min assist of PRN medications  Outcome: Progressing   Problem: Consults Goal: RH SPINAL CORD INJURY PATIENT EDUCATION Description Pt able to verbalize and initiate education regarding spinal cord injury with MOD I assist   Outcome: Progressing Goal: Skin Care Protocol Initiated - if Braden Score 18 or less Description Pt will have improved skin breakdown while in rehab with min assist   Outcome: Progressing   Problem: SCI BOWEL ELIMINATION Goal: RH STG MANAGE BOWEL WITH  ASSISTANCE Description STG Manage Bowel with min Assistance.  Outcome: Progressing Goal: RH STG SCI MANAGE BOWEL WITH MEDICATION WITH ASSISTANCE Description STG SCI Manage bowel with medication with Min assistance.  Outcome: Progressing   Problem: SCI BLADDER ELIMINATION Goal: RH STG MANAGE BLADDER WITH ASSISTANCE Description STG Manage Bladder With min Assistance  Outcome: Progressing   Problem: RH SKIN INTEGRITY Goal: RH STG SKIN FREE OF INFECTION/BREAKDOWN Description Skin will remain free from breakdown with min assist  Outcome: Progressing Goal: RH STG MAINTAIN SKIN INTEGRITY WITH ASSISTANCE Description STG Maintain Skin Integrity With min Assistance.  Outcome: Progressing   Problem: RH SAFETY Goal: RH STG ADHERE TO SAFETY PRECAUTIONS W/ASSISTANCE/DEVICE Description STG Adhere to Safety Precautions With min  Assistance/Device.  Outcome: Progressing   Problem: RH PAIN MANAGEMENT Goal: RH STG PAIN MANAGED AT OR BELOW PT'S PAIN GOAL Description Pain goal is less than 3 with min assist of PRN medications  Outcome: Progressing

## 2018-04-11 NOTE — Progress Notes (Signed)
PT has home CPAP at bedside.  PT places self on CPAP.

## 2018-04-11 NOTE — Progress Notes (Signed)
Physical Therapy Session Note  Patient Details  Name: Cody Velez MRN: [id number] Date of Birth: [birth year]  Today's Date: 04/11/2018 PT Individual Time: 1000-1100 PT Individual Time Calculation (min): 60 min   Short Term Goals: Week 2:  PT Short Term Goal 1 (Week 2): Pt will demonstrate sit<>stand with RW and max assist +1 PT Short Term Goal 2 (Week 2): pt will initiate pre-gait training with PT PT Short Term Goal 3 (Week 2): Pt will propel manual w/c x50' with supervision  Skilled Therapeutic Interventions/Progress Updates:  Pt reports pain is better today but still rates at 6/10 at rest and 9/10 with mobility.  Reports premedicated.  Session focus on sit<>stand transfers, standing tolerance, and NMR for pre-gait.  Pt requires mod assist for sit<>stand in // bars with PT facilitating L knee extension and providing verbal cues for hand placement and posture.  Pt able to complete 3/4 stands (on 3rd attempt, L knee buckled and pt required assist to return to w/c).  On 4th attempt, pt able to shift weight from LLE to RLE with therapist blocking L knee, and pt heavily relying on UEs for balance.  Pt requires extended rest breaks between attempts.  Returned to room at end of session, use of stedy to return to bed (supervision for sit<>stand in stedy), and mod assist to lift LEs into bed, but pt demonstrating improved log roll technique today.  Positioned to comfort with call bell in reach and RN present.   Therapy Documentation Precautions:  Precautions Precautions: Fall, Back Precaution Booklet Issued: No Precaution Comments: Pt needed min cues to recall 3/3 back precautions Required Braces or Orthoses: Spinal Brace Spinal Brace: Thoracolumbosacral orthotic, Applied in sitting position Restrictions Weight Bearing Restrictions: No   See Function Navigator for Current Functional Status.   Therapy/Group: Individual Therapy  Michel Santee 04/11/2018, 11:02 AM

## 2018-04-11 NOTE — Progress Notes (Signed)
Occupational Therapy Session Note  Patient Details  Name: Cody Velez MRN: [id number] Date of Birth: 05-Feb-1946  Today's Date: 04/11/2018 OT Individual Time: 0900-1000 OT Individual Time Calculation (min): 60 min    Short Term Goals: Week 2:  OT Short Term Goal 1 (Week 2): Pt will perform BSC squat pivot transfer with max A. OT Short Term Goal 2 (Week 2): Pt will thread pants over BLE sitting EOB using AE PRN with max A OT Short Term Goal 3 (Week 2): Pt will pull up pants sit<>stand in Stedy with max A  Skilled Therapeutic Interventions/Progress Updates:    Pt engaged in BADL retraining including bathing/dressing at bed level and sit<>stand from w/c. LB bathing completed at bed level before sitting EOB with steady A.  Pt able to move BLE off EOB without assistance. Pt requires assistance with donning TLSO.  Pt performs sit<>stand with Stedy without assistance.  Pt transferred to w/c to complete LB dressing tasks.  Pt able to thread pants and don socks with use of AE.  Pt initiated pulling pants over hips but required assistance while standing in [place].  Pt transitioned to day room for BUE therex with [employer] (7 mins at random load 5). Pt returned to room and remained in w/c with all needs within reach. Focus on bed mobility, sit<>stand, standing balance, BADL retraining, and activity tolerance to increase independence with BADLs.   Therapy Documentation Precautions:  Precautions Precautions: Fall, Back Precaution Booklet Issued: No Precaution Comments: Pt needed min cues to recall 3/3 back precautions Required Braces or Orthoses: Spinal Brace Spinal Brace: Thoracolumbosacral orthotic, Applied in sitting position Restrictions Weight Bearing Restrictions: No   Pain: Pain Assessment Pain Score: 4 in back; Rn aware    See Function Navigator for Current Functional Status.   Therapy/Group: Individual Therapy  [name] 04/11/2018, 1:09 PM

## 2018-04-11 NOTE — Progress Notes (Signed)
Physical Therapy Session Note  Patient Details  Name: Cody Velez MRN: [id number] Date of Birth: [birth year]  Today's Date: 04/11/2018 PT Individual Time: 3419-3790 PT Individual Time Calculation (min): 86 min   Short Term Goals: Week 2:  PT Short Term Goal 1 (Week 2): Pt will demonstrate sit<>stand with RW and max assist +1 PT Short Term Goal 2 (Week 2): pt will initiate pre-gait training with PT PT Short Term Goal 3 (Week 2): Pt will propel manual w/c x50' with supervision  Skilled Therapeutic Interventions/Progress Updates:    Pt supine in bed upon PT arrival, agreeable to therapy tx and denies pain. Pt transferred from supine>sitting edge of bed with emphasis on log roll technique, mod assist. Therapist donned shoes/socks total assist for time management. Pt performed sit<>stand within stedy with supervision and heavy reliance on UEs. Pt transferred to w/c with stedy. Pt transported to gym in w/c. Pt performed lateral scoot transfer from w/c>mat mod assist with manual facilitation for weightshifting and verbal cues for techniques, emphasis on pushing through LEs to off weight buttocks. Pt seated edge of mat worked on pushing through B UEs/LEs to lift buttocks off mat, pt able to completely clear mat with min-mod assist from therapist. Pt performed lateral scoot transfer from mat back to w/c mod assist. Pt performed w/c propulsion using B LEs to propel backwards for quadriceps strengthening and NMR, 2 x 10 ft. Pt performed x 5 sit<>stands within the parallel bars requiring mod assist, once in standing focused on knee and hip extension, pt requiring max assist to block L LE and +2 available for safety. Pt performed seated therex for strengthening and NMR including: LAQ, hip flexion and heel slides all until fatigued, x 2 sets of each. Pt transported back to room in w/c and transferred back to bed with stedy. Pt transferred sitting>sidelying>supine with mod assist and left supine with needs in  reach.   Therapy Documentation Precautions:  Precautions Precautions: Fall, Back Precaution Booklet Issued: No Precaution Comments: Pt needed min cues to recall 3/3 back precautions Required Braces or Orthoses: Spinal Brace Spinal Brace: Thoracolumbosacral orthotic, Applied in sitting position Restrictions Weight Bearing Restrictions: No   See Function Navigator for Current Functional Status.   Therapy/Group: Individual Therapy  Netta Corrigan, PT, DPT 04/11/2018, 7:58 AM

## 2018-04-11 NOTE — Progress Notes (Signed)
Subjective/Complaints: No new issues this morning other than not sleeping all that well.   ROS: Patient denies fever, rash, sore throat, blurred vision, nausea, vomiting, diarrhea, cough, shortness of breath or chest pain, joint pain, headache, or mood change.    Objective: Vital Signs: Blood pressure 139/85, pulse 85, temperature 98.6 F (37 C), temperature source Oral, resp. rate 14, height '6\' 3"'  (1.905 m), weight 126.1 kg (278 lb), SpO2 93 %. No results found. Results for orders placed or performed during the hospital encounter of 03/30/18 (from the past 72 hour(s))  Basic metabolic panel     Status: Abnormal   Collection Time: 04/10/18  4:44 AM  Result Value Ref Range   Sodium 135 135 - 145 mmol/L   Potassium 3.8 3.5 - 5.1 mmol/L   Chloride 102 101 - 111 mmol/L   CO2 27 22 - 32 mmol/L   Glucose, Bld 87 65 - 99 mg/dL   BUN 6 6 - 20 mg/dL   Creatinine, Ser 0.73 0.61 - 1.24 mg/dL   Calcium 8.6 (L) 8.9 - 10.3 mg/dL   GFR calc non Af Amer >60 >60 mL/min   GFR calc Af Amer >60 >60 mL/min    Comment: (NOTE) The eGFR has been calculated using the CKD EPI equation. This calculation has not been validated in all clinical situations. eGFR's persistently <60 mL/min signify possible Chronic Kidney Disease.    Anion gap 6 5 - 15    Comment: Performed at [facility] [address] [place], [place] [address]     Constitutional: No distress . Vital signs reviewed. HEENT: EOMI, oral membranes moist Cardiovascular: RRR without murmur. No JVD    Respiratory: CTA Bilaterally without wheezes or rales. Normal effort    GI: BS +, non-tender, non-distended  Neuro: Alert and Oriented Sensation diminished to light touch below umbilicus Motor: 5/5 in BUE proximal to distal RLE: hip flexion 2+/5, knee extension 3+/5, ankle dorsiflexion 3+ tp 4/5 LLE: Hip flexion  1+ to 2/5, KE 3/5 ankle dorsiflexion 3 -/5--neuro  Musc/Skel: crepitus, pain right knee with ROM Skin: back incision  remains intact       Assessment/Plan: 1. Functional deficits secondary to paraplegia secondary to epidural abscess which require 3+ hours per day of interdisciplinary therapy in a comprehensive inpatient rehab setting. Physiatrist is providing close team supervision and 24 hour management of active medical problems listed below. Physiatrist and rehab team continue to assess barriers to discharge/monitor patient progress toward functional and medical goals. FIM: Function - Bathing Bathing activity did not occur: (UB bathing only) Position: Shower Body parts bathed by patient: Right arm, Left arm, Chest, Abdomen, Front perineal area, Right upper leg, Left upper leg Body parts bathed by helper: Buttocks, Left lower leg, Right lower leg Bathing not applicable: Buttocks, Right upper leg, Left upper leg, Right lower leg, Left lower leg, Back Assist Level: Set up  Function- Upper Body Dressing/Undressing What is the patient wearing?: Pull over shirt/dress, Orthosis Pull over shirt/dress - Perfomed by patient: Thread/unthread right sleeve, Thread/unthread left sleeve, Put head through opening, Pull shirt over trunk Pull over shirt/dress - Perfomed by helper: Pull shirt over trunk Orthosis activity level: Performed by helper Assist Level: Supervision or verbal cues Function - Lower Body Dressing/Undressing What is the patient wearing?: Pants Position: Bed Pants- Performed by patient: Thread/unthread right pants leg, Thread/unthread left pants leg Pants- Performed by helper: Pull pants up/down Non-skid slipper socks- Performed by helper: Don/doff right sock, Don/doff left sock Assist for  footwear: Dependant Assist for lower body dressing: 2 Helpers(stand with Stedy)  Function - Toileting Toileting activity did not occur: No continent bowel/bladder event Toileting steps completed by patient: Adjust clothing after toileting Toileting steps completed by helper: Adjust clothing prior to  toileting, Performs perineal hygiene Assist level: Touching or steadying assistance (Pt.75%)  Function - Air cabin crew transfer activity did not occur: N/A Toilet transfer assistive device: Mechanical lift Mechanical lift: Stedy Assist level to toilet: Maximal assist (Pt 25 - 49%/lift and lower) Assist level from toilet: Maximal assist (Pt 25 - 49%/lift and lower)  Function - Chair/bed transfer Chair/bed transfer activity did not occur: N/A Chair/bed transfer method: Other Chair/bed transfer assist level: dependent (Pt equals 0%) Chair/bed transfer assistive device: Mechanical lift Mechanical lift: Stedy Chair/bed transfer details: Verbal cues for technique, Verbal cues for precautions/safety, Verbal cues for safe use of DME/AE, Manual facilitation for placement, Manual facilitation for weight shifting, Manual facilitation for weight bearing  Function - Locomotion: Wheelchair Will patient use wheelchair at discharge?: Yes Type: Manual Wheelchair activity did not occur: N/A Max wheelchair distance: 150' Assist Level: Supervision or verbal cues Wheel 50 feet with 2 turns activity did not occur: N/A Assist Level: Supervision or verbal cues Wheel 150 feet activity did not occur: N/A Assist Level: Supervision or verbal cues Turns around,maneuvers to table,bed, and toilet,negotiates 3% grade,maneuvers on rugs and over doorsills: No Function - Locomotion: Ambulation Ambulation activity did not occur: N/A Walk 10 feet activity did not occur: N/A Walk 50 feet with 2 turns activity did not occur: N/A Walk 150 feet activity did not occur: N/A Walk 10 feet on uneven surfaces activity did not occur: N/A  Function - Comprehension Comprehension: Auditory Comprehension assist level: Follows complex conversation/direction with no assist  Function - Expression Expression: Verbal Expression assist level: Expresses complex ideas: With no assist  Function - Social Interaction Social  Interaction assist level: Interacts appropriately with others - No medications needed.  Function - Problem Solving Problem solving assist level: Solves complex problems: Recognizes & self-corrects  Function - Memory Memory assist level: Complete Independence: No helper Patient normally able to recall (first 3 days only): Current season, Location of own room, Staff names and faces, That he or she is in a hospital  Medical Problem List and Plan: 1.Decreased functional mobilitysecondary to T10-11 osteomyelitis/epidural abscess with subsequent myelopathy/paraplegia, status post decompression and fusion T8-L3. Back brace when out of bed. Patient with history of multiple lumbar surgeries as well as spinal cord stimulator January 2019   Continue PT, OT therapies. Team conf today  2. DVT left soleal, right peroneal DVT's:.     prophylactic doses of Lovenox started on 4/12.    Repeat dopplers ?next week 3. Pain Management:Neurontin 600 mg 3 times daily, Robaxin   as needed   Lidoderm patch started on 4/13   -scheduled tylenol  -oxycodone prn.  -scapular pain improved  -Patient is working through pain 4. Mood:Aricept 10 mg nightly, Namenda 10 mg twice daily, Effexor sore 150 mg twice daily, trazodone 100 mg nightly 5. Neuropsych: This patientisfully capable of making decisions on hisown behalf. 6. Skin/Wound Care:staples out per NS 7. Fluids/Electrolytes/Nutrition:Routine I/O's  -supplementing potassium KCl 20 mEq/day.  Potassium 3.8   4/22  - Appetite  improving    -appreciate RD consult.  8.ID/MSSA bacteremia.Intravenous Ancef 2 g every 8 hours through 05/22/2018 and stop as well as rifampin 300 mg twice daily.    -outpt Follow-up per infectious disease 9.Acute on chronic anemia.  Hemoglobin 8 on 4/15 (stable)---recheck Thursday    10.Hypertension. Norvasc 10 mg daily   Vitals:   04/10/18 1401 04/11/18 0300  BP: 119/84 139/85  Pulse: (!) 108 85  Resp:  14   Temp: 98.7 F (37.1 C) 98.6 F (37 C)  SpO2: 97% 93%  reasonable control 11.Hyperlipidemia. Pravachol 12.History of prostate cancer. Proscar 5 mg daily 13.Constipation. Laxative assistance, adjust regimen for more regular movements 14. Urine retention, occasional incontinence seems to be improving  ---Ua negative, ucx negative  LOS (Days) 12 A FACE TO FACE EVALUATION WAS PERFORMED  [name] 04/11/2018, 8:44 AM

## 2018-04-11 NOTE — Plan of Care (Signed)
  Problem: Consults Goal: RH SPINAL CORD INJURY PATIENT EDUCATION Description Pt able to verbalize and initiate education regarding spinal cord injury with MOD I assist   Outcome: Progressing Goal: Skin Care Protocol Initiated - if Braden Score 18 or less Description Pt will have improved skin breakdown while in rehab with min assist   Outcome: Progressing   Problem: SCI BOWEL ELIMINATION Goal: RH STG MANAGE BOWEL WITH ASSISTANCE Description STG Manage Bowel with min Assistance.  Outcome: Progressing Goal: RH STG SCI MANAGE BOWEL WITH MEDICATION WITH ASSISTANCE Description STG SCI Manage bowel with medication with Min assistance.  Outcome: Progressing   Problem: SCI BLADDER ELIMINATION Goal: RH STG MANAGE BLADDER WITH ASSISTANCE Description STG Manage Bladder With min Assistance  Outcome: Progressing   Problem: RH SKIN INTEGRITY Goal: RH STG SKIN FREE OF INFECTION/BREAKDOWN Description Skin will remain free from breakdown with min assist  Outcome: Progressing Goal: RH STG MAINTAIN SKIN INTEGRITY WITH ASSISTANCE Description STG Maintain Skin Integrity With min Assistance.  Outcome: Progressing   Problem: RH SAFETY Goal: RH STG ADHERE TO SAFETY PRECAUTIONS W/ASSISTANCE/DEVICE Description STG Adhere to Safety Precautions With min  Assistance/Device.  Outcome: Progressing   Problem: RH PAIN MANAGEMENT Goal: RH STG PAIN MANAGED AT OR BELOW PT'S PAIN GOAL Description Pain goal is less than 4 with min assist of PRN medications   Outcome: Progressing

## 2018-04-12 ENCOUNTER — Inpatient Hospital Stay (HOSPITAL_COMMUNITY): Payer: Federal, State, Local not specified - PPO | Admitting: Physical Therapy

## 2018-04-12 ENCOUNTER — Inpatient Hospital Stay (HOSPITAL_COMMUNITY): Payer: Federal, State, Local not specified - PPO

## 2018-04-12 NOTE — Progress Notes (Signed)
Physical Therapy Session Note  Patient Details  Name: Cody Velez MRN: [id number] Date of Birth: 11-15-1946  Today's Date: 04/12/2018 PT Individual Time: 1345-1430 PT Individual Time Calculation (min): 45 min   Short Term Goals: Week 2:  PT Short Term Goal 1 (Week 2): Pt will demonstrate sit<>stand with RW and max assist +1 PT Short Term Goal 2 (Week 2): pt will initiate pre-gait training with PT PT Short Term Goal 3 (Week 2): Pt will propel manual w/c x50' with supervision  Skilled Therapeutic Interventions/Progress Updates:    Pt supine in bed, agreeable to PT. Pt reports 6/10 back pain at rest, increases with movement. Supine to sit with log-rolling technique with mod assist for trunk control and LE management. Dons TLSO with max assist while seated EOB. Sit to stand CGA from elevated bed to stedy. Stedy transfer bed to/from w/c. Sit to stand x 2 reps in // bars with min assist, L knee blocked in standing, manual cues for upright posture. Pt has significant increase in back pain in standing, poor tolerance for standing 2/2 pain. Toilet transfer with stedy with min-mod assist. Pt is dependent for clothing management and brief change due to heavy reliance on BUE for support in standing. Assisted pt back to bed at end of session. Pt left sidelying on R side in bed with needs in reach, bed alarm in place.  Therapy Documentation Precautions:  Precautions Precautions: Fall, Back Precaution Booklet Issued: No Precaution Comments: Pt needed min cues to recall 3/3 back precautions Required Braces or Orthoses: Spinal Brace Spinal Brace: Thoracolumbosacral orthotic, Applied in sitting position Restrictions Weight Bearing Restrictions: No  See Function Navigator for Current Functional Status.   Therapy/Group: Individual Therapy  Excell Seltzer, PT, DPT  04/12/2018, 4:32 PM

## 2018-04-12 NOTE — Progress Notes (Signed)
Physical Therapy Session Note  Patient Details  Name: Cody Velez MRN: [id number] Date of Birth: [birth year]  Today's Date: 04/12/2018 PT Individual Time: 1100-1200 and 1600-1630 PT Individual Time Calculation (min): 60 min and 30 min   Short Term Goals: Week 2:  PT Short Term Goal 1 (Week 2): Pt will demonstrate sit<>stand with RW and max assist +1 PT Short Term Goal 2 (Week 2): pt will initiate pre-gait training with PT PT Short Term Goal 3 (Week 2): Pt will propel manual w/c x50' with supervision  Skilled Therapeutic Interventions/Progress Updates:    Session 1: c/o 6/10 pain at rest, medicated at end of session.  Session focus on LE/UE strengthening and endurance.  Pt taken to therapy gym total assist.  PT instructs pt in BLE and BUE therex as follows:  2x10 reps hip flexion, LAQ, and heel/toe raises 3x15-30 second self stretch for heel cords and hamstrings with long sheet 2x10 reps bicep curls, chest press, and shoulder press (pt c/o soreness in anterior/posterior L shoulder with overhead exercise)  Returned to room at end of session and used stedy for transfer back to bed.  Supervision for sit<>stand in stedy.  Mod assist to lift LEs into bed and pt demonstrating significant improvement in log roll technique but continues with pain going sit>supine.  Positioned to comfort with call bell in reach and needs met.   Session 2: c/o 9/10 pain at rest.  RN in at start of session to medicate and pt wishing to continue to therapy.  Supine>sit with log roll and close supervision.  TLSO donned total assist for time management.  Pt continues to c/o pain in L shoulder, suspect impingement or overuse.  Pt comes to standing x4 in stedy with supervision.  In supported standing on 2nd and 3rd trials focus on upright posture and trunk endurance with BUEs engaged in ball toss/catch.  Pt returned to supine at end of session as before and positioned with call bell in reach, ice to L shoulder, and needs  met.   Therapy Documentation Precautions:  Precautions Precautions: Fall, Back Precaution Booklet Issued: No Precaution Comments: Pt needed min cues to recall 3/3 back precautions Required Braces or Orthoses: Spinal Brace Spinal Brace: Thoracolumbosacral orthotic, Applied in sitting position Restrictions Weight Bearing Restrictions: No   See Function Navigator for Current Functional Status.   Therapy/Group: Individual Therapy  Michel Santee 04/12/2018, 1:50 PM

## 2018-04-12 NOTE — Progress Notes (Signed)
Social Work Patient ID: Cody Velez, male   DOB: 1946-07-28, 72 y.o.   MRN: [id number]   Have reviewed team conference with pt and girlfriend, [name].  Both aware and agreeable with continued target date of 5/18 and min assist w/c goals.  Pt hopeful he might make enough progress this week for PT to set gait goals but stressed to him that, at best, it would be very short distances only.  He understands this but remains optimistic.  Pt and girlfriend pleased with current progress.  Continue to follow.  Hanaa Payes, LCSW

## 2018-04-12 NOTE — Progress Notes (Signed)
Occupational Therapy Session Note  Patient Details  Name: Cody Velez MRN: [id number] Date of Birth: Sep 25, 1946  Today's Date: 04/12/2018 OT Individual Time: 1000-1100 OT Individual Time Calculation (min): 60 min    Short Term Goals: Week 2:  OT Short Term Goal 1 (Week 2): Pt will perform BSC squat pivot transfer with max A. OT Short Term Goal 2 (Week 2): Pt will thread pants over BLE sitting EOB using AE PRN with max A OT Short Term Goal 3 (Week 2): Pt will pull up pants sit<>stand in Stedy with max A  Skilled Therapeutic Interventions/Progress Updates:    Pt resting in bed upon arrival.  OT intervention with focus on bed mobility including supine>sit EOB, sit<>stand with Stedy, bathing at bed level and sitting EOB, and dressing with sit<>stand with Stedy from w/c to increase independence with BADLs. Pt was able to move BLE off EOB without assistance this morning and perform sidelying>sit EOB with steady A.  Pt maintained sitting balance EOB with occasional steady A to complete UB bathing and dressing tasks.  Pt required assistance to recover to midline when leaning to his L.  Pt performs sit<>stand with min A using Stedy. Pt donned pants with use of reacher but required assistance to pull over hips when standing in [place].  Pt completed grooming tasks seated in w/c at sink.   Therapy Documentation Precautions:  Precautions Precautions: Fall, Back Precaution Booklet Issued: No Precaution Comments: Pt needed min cues to recall 3/3 back precautions Required Braces or Orthoses: Spinal Brace Spinal Brace: Thoracolumbosacral orthotic, Applied in sitting position Restrictions Weight Bearing Restrictions: No Pain: Pain Assessment Pain Score: 6 Pain Type: Acute pain Pain Location: Back Pain Descriptors / Indicators: Aching;Discomfort Pain Intervention(s): Medication (See eMAR)  See Function Navigator for Current Functional Status.   Therapy/Group: Individual Therapy  [name] 04/12/2018, 2:44 PM

## 2018-04-12 NOTE — Patient Care Conference (Signed)
Inpatient RehabilitationTeam Conference and Plan of Care Update Date: 04/11/2018   Time: 2:10 PM    Patient Name: Cody Velez      Medical Record Number: [id number]  Date of Birth: July 01, 1946 Sex: Male         Room/Bed: 4W06C/4W06C-01 Payor Info: Payor: [place] / Plan: BCBS/[employer] PPO / Product Type: *No Product type* /    Admitting Diagnosis: T 10  11 Myelopathy  Admit Date/Time:  03/30/2018  3:13 PM Admission Comments: No comment available   Primary Diagnosis:  Paraplegia ([place]) Principal Problem: Paraplegia [facility])  Patient Active Problem List   Diagnosis Date Noted  . Postoperative pain   . DVT, lower extremity, distal, acute, bilateral ([place])   . Acute blood loss anemia   . Anemia of chronic disease   . Essential hypertension   . Bacteremia   . Myelopathy ([place]) 03/30/2018  . Paraplegia ([place])   . Postlaminectomy syndrome, lumbar region   . Epidural abscess   . Abdominal distension   . Encephalopathy   . Weakness of both lower extremities   . Discitis of thoracic region   . Spinal cord stimulator status   . Staphylococcus aureus sepsis ([place]) 03/20/2018  . Bacteremia due to methicillin susceptible Staphylococcus aureus (MSSA) 03/18/2018  . Obesity 03/18/2018  . Weakness 03/16/2018  . Fever 03/16/2018  . Sepsis ([place]) 03/16/2018  . Nephrolithiasis 03/16/2018  . AKI (acute kidney injury) ([place]) 03/16/2018  . Acute metabolic encephalopathy 60/73/7106  . Cognitive impairment 03/16/2018  . Chronic pain syndrome 03/16/2018  . B12 deficiency 01/28/2016  . Memory difficulty 07/23/2015  . History of cerebrovascular disease 07/23/2015  . FH: colon cancer 08/13/2014  . Hx of adenomatous colonic polyps 08/13/2014  . Chronic back pain 05/30/2012  . CAD, NATIVE VESSEL 07/17/2010  . HYPERCHOLESTEROLEMIA 10/31/2009  . SMOKELESS TOBACCO ABUSE 10/31/2009  . Obstructive sleep apnea 10/31/2009  . Essential hypertension, benign 10/31/2009  . GERD 10/31/2009     Expected Discharge Date: Expected Discharge Date: 05/06/18  Team Members Present: Physician leading conference: Dr. Alger Simons Social Worker Present: Lennart Pall, LCSW Nurse Present: Junius Creamer, RN PT Present: Dwyane Dee, PT OT Present: Roanna Epley, Verdie Mosher, OT SLP Present: Weston Anna, SLP PPS Coordinator present : Daiva Nakayama, RN, CRRN     Current Status/Progress Goal Weekly Team Focus  Medical   pain improving. ongoing motor/sensory deficits which are limiting given his size  improve functional mobility  see prior, pain rx   Bowel/Bladder   continent of bowel & bladder with some night time episodes of urinary incontinence & spillage, LBM 04/08/18  continence of bowel & bladder with mod I assist  continue to assist as needed   Swallow/Nutrition/ Hydration             ADL's   bathing-mod A; LB dsg-max A; UB bathing/dsg-supervision; tranfsers with Stedy; sit<>stand with Stedy-min A/S; fatigues quickly  min A overall  bed mobility, sit<>stand, functional transfers, LB bathing/dressing; activity tolerance   Mobility   sit<>stand with supervision with stedy, mod/max +2 for sit<>Stand with RW, supervision w/c propulsion  min assist w/c level transfers; mod I w/c propulsion; mod assist sstanding balance and car transfer goal  transfers, strengthening, trunk control, activity tolerance/endurance, bed mobility    Communication             Safety/Cognition/ Behavioral Observations            Pain   back pain, has robaxin scheduled, prn oxycodone,  tylenol & baclofen  pain scale <4  continue to assess & treat as needed   Skin   surgical incision to midline of back with steristrips, stage II to buttocks is healed/ foam in place, ecchymosis to BUE  no new areas of skin break down  assess q shift    Rehab Goals Patient on target to meet rehab goals: Yes *See Care Plan and progress notes for long and short-term goals.     Barriers to Discharge  Current  Status/Progress Possible Resolutions Date Resolved   Physician    Medical stability        home modifications, proper adaptive equipment      Nursing  IV antibiotics               PT                    OT                  SLP                SW                Discharge Planning/Teaching Needs:  Pt to d/c home with girlfriend who can provide light physical assistance.  Daughter are available for intermittent support.  Teaching to be planned closer to d/c.   Team Discussion:  Pain better per pt report;  Pt still with significant neurological deficits.  Some improvement with ADLs using AE.  Mod assist with toilet hygeine.  Working on standing and pre-gait and may be able to set some gait goals end of week.  Doing very well with STEDY transfers.  Goals still at min assist w/c level.  Pt remains very motivated and working very hard!  Revisions to Treatment Plan:  None    Continued Need for Acute Rehabilitation Level of Care: The patient requires daily medical management by a physician with specialized training in physical medicine and rehabilitation for the following conditions: Daily direction of a multidisciplinary physical rehabilitation program to ensure safe treatment while eliciting the highest outcome that is of practical value to the patient.: Yes Daily medical management of patient stability for increased activity during participation in an intensive rehabilitation regime.: Yes Daily analysis of laboratory values and/or radiology reports with any subsequent need for medication adjustment of medical intervention for : Neurological problems;Post surgical problems  Fedor Kazmierski 04/12/2018, 10:54 AM

## 2018-04-12 NOTE — Progress Notes (Signed)
Subjective/Complaints: Woke up with more pain this morning. Otherwise had a good night.   ROS: Patient denies fever, rash, sore throat, blurred vision, nausea, vomiting, diarrhea, cough, shortness of breath or chest pain, joint or back pain, headache, or mood change.    Objective: Vital Signs: Blood pressure 135/70, pulse 89, temperature 98.6 F (37 C), temperature source Oral, resp. rate 18, height '6\' 3"'  (1.905 m), weight 128 kg (282 lb 3 oz), SpO2 91 %. No results found. Results for orders placed or performed during the hospital encounter of 03/30/18 (from the past 72 hour(s))  Basic metabolic panel     Status: Abnormal   Collection Time: 04/10/18  4:44 AM  Result Value Ref Range   Sodium 135 135 - 145 mmol/L   Potassium 3.8 3.5 - 5.1 mmol/L   Chloride 102 101 - 111 mmol/L   CO2 27 22 - 32 mmol/L   Glucose, Bld 87 65 - 99 mg/dL   BUN 6 6 - 20 mg/dL   Creatinine, Ser 0.73 0.61 - 1.24 mg/dL   Calcium 8.6 (L) 8.9 - 10.3 mg/dL   GFR calc non Af Amer >60 >60 mL/min   GFR calc Af Amer >60 >60 mL/min    Comment: (NOTE) The eGFR has been calculated using the CKD EPI equation. This calculation has not been validated in all clinical situations. eGFR's persistently <60 mL/min signify possible Chronic Kidney Disease.    Anion gap 6 5 - 15    Comment: Performed at [facility] [address] [place], [place] [address]     Constitutional: No distress . Vital signs reviewed. HEENT: EOMI, oral membranes moist Cardiovascular: RRR without murmur. No JVD    Respiratory: CTA Bilaterally without wheezes or rales. Normal effort    GI: BS +, non-tender, non-distended  Neuro: Alert and Oriented Sensation diminished to light touch below umbilicus Motor: 5/5 in BUE proximal to distal RLE: hip flexion 2+/5, knee extension 3+/5, ankle dorsiflexion 3+ tp 4/5 LLE: Hip flexion  1+ to 2/5, KE 3/5 ankle dorsiflexion 3 -/5--neuro  Musc/Skel: crepitus, pain right knee with ROM Skin: back  incision remains intact       Assessment/Plan: 1. Functional deficits secondary to paraplegia secondary to epidural abscess which require 3+ hours per day of interdisciplinary therapy in a comprehensive inpatient rehab setting. Physiatrist is providing close team supervision and 24 hour management of active medical problems listed below. Physiatrist and rehab team continue to assess barriers to discharge/monitor patient progress toward functional and medical goals. FIM: Function - Bathing Bathing activity did not occur: Refused Position: Shower Body parts bathed by patient: Right arm, Left arm, Chest, Abdomen, Front perineal area, Right upper leg, Left upper leg Body parts bathed by helper: Buttocks, Left lower leg, Right lower leg Bathing not applicable: Buttocks, Right upper leg, Left upper leg, Right lower leg, Left lower leg, Back Assist Level: Set up  Function- Upper Body Dressing/Undressing Upper body dressing/undressing activity did not occur: Refused What is the patient wearing?: Pull over shirt/dress, Orthosis Pull over shirt/dress - Perfomed by patient: Thread/unthread right sleeve, Thread/unthread left sleeve, Put head through opening, Pull shirt over trunk Pull over shirt/dress - Perfomed by helper: Pull shirt over trunk Orthosis activity level: Performed by helper Assist Level: Supervision or verbal cues Function - Lower Body Dressing/Undressing What is the patient wearing?: Pants Position: Bed Pants- Performed by patient: Thread/unthread right pants leg, Thread/unthread left pants leg Pants- Performed by helper: Pull pants up/down Non-skid slipper socks- Performed  by patient: Don/doff right sock, Don/doff left sock Non-skid slipper socks- Performed by helper: Don/doff right sock, Don/doff left sock Assist for footwear: Dependant Assist for lower body dressing: Touching or steadying assistance (Pt > 75%)  Function - Toileting Toileting activity did not occur: No  continent bowel/bladder event Toileting steps completed by patient: Adjust clothing after toileting Toileting steps completed by helper: Adjust clothing prior to toileting, Performs perineal hygiene Assist level: Touching or steadying assistance (Pt.75%)  Function - Air cabin crew transfer activity did not occur: N/A Toilet transfer assistive device: Mechanical lift Mechanical lift: Stedy Assist level to toilet: Maximal assist (Pt 25 - 49%/lift and lower) Assist level from toilet: Maximal assist (Pt 25 - 49%/lift and lower)  Function - Chair/bed transfer Chair/bed transfer activity did not occur: N/A Chair/bed transfer method: Lateral scoot Chair/bed transfer assist level: Moderate assist (Pt 50 - 74%/lift or lower) Chair/bed transfer assistive device: Armrests Mechanical lift: Stedy Chair/bed transfer details: Manual facilitation for weight shifting, Verbal cues for technique, Verbal cues for precautions/safety  Function - Locomotion: Wheelchair Will patient use wheelchair at discharge?: Yes Type: Manual Wheelchair activity did not occur: N/A Max wheelchair distance: 150' Assist Level: Supervision or verbal cues Wheel 50 feet with 2 turns activity did not occur: N/A Assist Level: Supervision or verbal cues Wheel 150 feet activity did not occur: N/A Assist Level: Supervision or verbal cues Turns around,maneuvers to table,bed, and toilet,negotiates 3% grade,maneuvers on rugs and over doorsills: No Function - Locomotion: Ambulation Ambulation activity did not occur: N/A Walk 10 feet activity did not occur: N/A Walk 50 feet with 2 turns activity did not occur: N/A Walk 150 feet activity did not occur: N/A Walk 10 feet on uneven surfaces activity did not occur: N/A  Function - Comprehension Comprehension: Auditory Comprehension assist level: Follows complex conversation/direction with no assist  Function - Expression Expression: Verbal Expression assistive device:  Communication board Expression assist level: Expresses complex ideas: With no assist  Function - Social Interaction Social Interaction assist level: Interacts appropriately with others - No medications needed.  Function - Problem Solving Problem solving assist level: Solves complex problems: Recognizes & self-corrects  Function - Memory Memory assist level: Complete Independence: No helper Patient normally able to recall (first 3 days only): Current season, Location of own room, Staff names and faces, That he or she is in a hospital  Medical Problem List and Plan: 1.Decreased functional mobilitysecondary to T10-11 osteomyelitis/epidural abscess with subsequent myelopathy/paraplegia, status post decompression and fusion T8-L3. Back brace when out of bed. Patient with history of multiple lumbar surgeries as well as spinal cord stimulator January 2019   Continue PT, OT therapies.   2. DVT left soleal, right peroneal DVT's:.     prophylactic doses of Lovenox started on 4/12.    Repeat dopplers next week or two 3. Pain Management:Neurontin 600 mg 3 times daily, Robaxin   as needed   Lidoderm patch started on 4/13   -scheduled tylenol  -oxycodone prn.  -scapular pain improved  -Patient is working through pain for the most part 4. Mood:Aricept 10 mg nightly, Namenda 10 mg twice daily, Effexor sore 150 mg twice daily, trazodone 100 mg nightly 5. Neuropsych: This patientisfully capable of making decisions on hisown behalf. 6. Skin/Wound Care:staples out per NS 7. Fluids/Electrolytes/Nutrition:Routine I/O's  -supplementing potassium KCl 20 mEq/day.  Potassium 3.8   4/22  - Appetite  improving    -appreciate RD consult.  8.ID/MSSA bacteremia.Intravenous Ancef 2 g every 8 hours through 05/22/2018 and  stop as well as rifampin 300 mg twice daily.    -outpt Follow-up per infectious disease 9.Acute on chronic anemia.     Hemoglobin 8 on 4/15 (stable)---recheck labs tomorrow     10.Hypertension. Norvasc 10 mg daily   Vitals:   04/11/18 1407 04/12/18 0202  BP: 124/83 135/70  Pulse: 91 89  Resp: 16 18  Temp: 98.4 F (36.9 C) 98.6 F (37 C)  SpO2: 98% 91%  reasonable control at present 11.Hyperlipidemia. Pravachol 12.History of prostate cancer. Proscar 5 mg daily 13.Constipation. Laxative assistance, adjust regimen for more regular movements 14. Urine retention, occasional incontinence seems to be improving  ---Ua negative, ucx negative  LOS (Days) 13 A FACE TO FACE EVALUATION WAS PERFORMED  [name] 04/12/2018, 8:51 AM

## 2018-04-13 ENCOUNTER — Inpatient Hospital Stay (HOSPITAL_COMMUNITY): Payer: Federal, State, Local not specified - PPO

## 2018-04-13 ENCOUNTER — Inpatient Hospital Stay (HOSPITAL_COMMUNITY): Payer: Federal, State, Local not specified - PPO | Admitting: Occupational Therapy

## 2018-04-13 ENCOUNTER — Inpatient Hospital Stay (HOSPITAL_COMMUNITY): Payer: Federal, State, Local not specified - PPO | Admitting: Physical Therapy

## 2018-04-13 LAB — CBC
HCT: 29.8 % — ABNORMAL LOW (ref 39.0–52.0)
Hemoglobin: 8.8 g/dL — ABNORMAL LOW (ref 13.0–17.0)
MCH: 25.8 pg — ABNORMAL LOW (ref 26.0–34.0)
MCHC: 29.5 g/dL — ABNORMAL LOW (ref 30.0–36.0)
MCV: 87.4 fL (ref 78.0–100.0)
Platelets: 685 10*3/uL — ABNORMAL HIGH (ref 150–400)
RBC: 3.41 MIL/uL — ABNORMAL LOW (ref 4.22–5.81)
RDW: 17.4 % — ABNORMAL HIGH (ref 11.5–15.5)
WBC: 6.9 10*3/uL (ref 4.0–10.5)

## 2018-04-13 NOTE — Progress Notes (Signed)
Subjective/Complaints: States that his left shoulder is bothering him this morning.  Reports having arthritis in his shoulder previously.  ROS: Patient denies fever, rash, sore throat, blurred vision, nausea, vomiting, diarrhea, cough, shortness of breath or chest pain,  headache, or mood change.    Objective: Vital Signs: Blood pressure (!) 148/78, pulse 83, temperature 98.7 F (37.1 C), temperature source Oral, resp. rate 18, height 6\' 3"  (1.905 m), weight 125.8 kg (277 lb 5.4 oz), SpO2 98 %. No results found. Results for orders placed or performed during the hospital encounter of 03/30/18 (from the past 72 hour(s))  CBC     Status: Abnormal   Collection Time: 04/13/18  4:24 AM  Result Value Ref Range   WBC 6.9 4.0 - 10.5 K/uL   RBC 3.41 (L) 4.22 - 5.81 MIL/uL   Hemoglobin 8.8 (L) 13.0 - 17.0 g/dL   HCT 29.8 (L) 39.0 - 52.0 %   MCV 87.4 78.0 - 100.0 fL   MCH 25.8 (L) 26.0 - 34.0 pg   MCHC 29.5 (L) 30.0 - 36.0 g/dL   RDW 17.4 (H) 11.5 - 15.5 %   Platelets 685 (H) 150 - 400 K/uL    Comment: Performed at [facility], [address], [place], [place] [address]     Constitutional: No distress . Vital signs reviewed. HEENT: EOMI, oral membranes moist Neck: supple Cardiovascular: RRR without murmur. No JVD    Respiratory: CTA Bilaterally without wheezes or rales. Normal effort    GI: BS +, non-tender, non-distended  Neuro: Alert and Oriented Sensation diminished to light touch below umbilicus Motor: 5/5 in BUE proximal to distal RLE: hip flexion 2+/5, knee extension 3+/5, ankle dorsiflexion 3+ tp 4/5 LLE: Hip flexion  1+ to 2/5, KE 3/5 ankle dorsiflexion 3 -/5--neuro  Musc/Skel: Impingement maneuver positive left shoulder Skin: back incision remains intact       Assessment/Plan: 1. Functional deficits secondary to paraplegia secondary to epidural abscess which require 3+ hours per day of interdisciplinary therapy in a comprehensive inpatient rehab setting. Physiatrist  is providing close team supervision and 24 hour management of active medical problems listed below. Physiatrist and rehab team continue to assess barriers to discharge/monitor patient progress toward functional and medical goals. FIM: Function - Bathing Bathing activity did not occur: Refused Position: Bed Body parts bathed by patient: Right arm, Left arm, Chest, Abdomen, Front perineal area, Left upper leg, Right upper leg, Buttocks Body parts bathed by helper: Right lower leg, Left lower leg Bathing not applicable: Buttocks, Right upper leg, Left upper leg, Right lower leg, Left lower leg, Back Assist Level: Set up  Function- Upper Body Dressing/Undressing Upper body dressing/undressing activity did not occur: Refused What is the patient wearing?: Pull over shirt/dress, Orthosis Pull over shirt/dress - Perfomed by patient: Thread/unthread right sleeve, Thread/unthread left sleeve, Put head through opening, Pull shirt over trunk Pull over shirt/dress - Perfomed by helper: Pull shirt over trunk Orthosis activity level: Performed by helper Assist Level: Supervision or verbal cues Function - Lower Body Dressing/Undressing What is the patient wearing?: Pants Position: Bed Pants- Performed by patient: Thread/unthread right pants leg, Thread/unthread left pants leg Pants- Performed by helper: Pull pants up/down Non-skid slipper socks- Performed by patient: Don/doff right sock, Don/doff left sock Non-skid slipper socks- Performed by helper: Don/doff right sock, Don/doff left sock Assist for footwear: Dependant Assist for lower body dressing: Touching or steadying assistance (Pt > 75%)  Function - Toileting Toileting activity did not occur: No continent bowel/bladder  event Toileting steps completed by patient: Adjust clothing after toileting Toileting steps completed by helper: Adjust clothing prior to toileting, Adjust clothing after toileting Assist level: Touching or steadying assistance  (Pt.75%)  Function - Toilet Transfers Toilet transfer activity did not occur: N/A Toilet transfer assistive device: Mechanical lift Mechanical lift: Stedy Assist level to toilet: Moderate assist (Pt 50 - 74%/lift or lower) Assist level from toilet: Moderate assist (Pt 50 - 74%/lift or lower)  Function - Chair/bed transfer Chair/bed transfer activity did not occur: N/A Chair/bed transfer method: Other Chair/bed transfer assist level: dependent (Pt equals 0%) Chair/bed transfer assistive device: Mechanical lift Mechanical lift: Stedy Chair/bed transfer details: Manual facilitation for weight shifting, Verbal cues for technique, Verbal cues for precautions/safety  Function - Locomotion: Wheelchair Will patient use wheelchair at discharge?: Yes Type: Manual Wheelchair activity did not occur: N/A Max wheelchair distance: 150' Assist Level: Supervision or verbal cues Wheel 50 feet with 2 turns activity did not occur: N/A Assist Level: Supervision or verbal cues Wheel 150 feet activity did not occur: N/A Assist Level: Supervision or verbal cues Turns around,maneuvers to table,bed, and toilet,negotiates 3% grade,maneuvers on rugs and over doorsills: No Function - Locomotion: Ambulation Ambulation activity did not occur: N/A Walk 10 feet activity did not occur: N/A Walk 50 feet with 2 turns activity did not occur: N/A Walk 150 feet activity did not occur: N/A Walk 10 feet on uneven surfaces activity did not occur: N/A  Function - Comprehension Comprehension: Auditory Comprehension assist level: Follows complex conversation/direction with no assist  Function - Expression Expression: Verbal Expression assistive device: Communication board Expression assist level: Expresses complex ideas: With no assist  Function - Social Interaction Social Interaction assist level: Interacts appropriately with others - No medications needed.  Function - Problem Solving Problem solving assist  level: Solves complex problems: Recognizes & self-corrects  Function - Memory Memory assist level: Complete Independence: No helper Patient normally able to recall (first 3 days only): Current season, Location of own room, Staff names and faces, That he or she is in a hospital  Medical Problem List and Plan: 1.Decreased functional mobilitysecondary to T10-11 osteomyelitis/epidural abscess with subsequent myelopathy/paraplegia, status post decompression and fusion T8-L3. Back brace when out of bed. Patient with history of multiple lumbar surgeries as well as spinal cord stimulator January 2019   Continue PT, OT therapies.   2. DVT left soleal, right peroneal DVT's:.     prophylactic doses of Lovenox started on 4/12.    Repeat dopplers next week or two 3. Pain Management:Neurontin 600 mg 3 times daily, Robaxin   as needed   Lidoderm patch started on 4/13   -scheduled tylenol  -oxycodone prn.  -Patient may use topical treatments including heat and muscle rub for left shoulder.  Discussed making sure that his tranfer technique is appropriate as well. 4. Mood:Aricept 10 mg nightly, Namenda 10 mg twice daily, Effexor sore 150 mg twice daily, trazodone 100 mg nightly 5. Neuropsych: This patientisfully capable of making decisions on hisown behalf. 6. Skin/Wound Care:staples out per NS 7. Fluids/Electrolytes/Nutrition:Routine I/O's  -supplementing potassium KCl 20 mEq/day.  Potassium 3.8   4/22  - Appetite  improved      8.ID/MSSA bacteremia.Intravenous Ancef 2 g every 8 hours through 05/22/2018 and stop as well as rifampin 300 mg twice daily.    -outpt Follow-up per infectious disease 9.Acute on chronic anemia.     Hemoglobin increased to 8.8 on 4/25    10.Hypertension. Norvasc 10 mg daily  Vitals:   04/13/18 0005 04/13/18 0241  BP:  (!) 148/78  Pulse: 90 83  Resp: 18 18  Temp:  98.7 F (37.1 C)  SpO2: 94% 98%  reasonable control at present 11.Hyperlipidemia.  Pravachol 12.History of prostate cancer. Proscar 5 mg daily 13.Constipation. Laxative assistance, adjust regimen for more regular movements 14. Urine retention, occasional incontinence seems to be improving  ---Ua negative, ucx negative  LOS (Days) 14 A FACE TO FACE EVALUATION WAS PERFORMED  [name] 04/13/2018, 9:44 AM

## 2018-04-13 NOTE — Progress Notes (Signed)
Nutrition Follow-up  DOCUMENTATION CODES:   Obesity unspecified  INTERVENTION:  Continue Ensure Enlive po BID, each supplement provides 350 kcal and 20 grams of protein.  Encourage adequate PO intake.   NUTRITION DIAGNOSIS:   Inadequate oral intake related to poor appetite as evidenced by per patient/family report; ongoing  GOAL:   Patient will meet greater than or equal to 90% of their needs; met  MONITOR:   PO intake, Supplement acceptance, Labs, Weight trends, I & O's, Skin  REASON FOR ASSESSMENT:   Consult Poor PO  ASSESSMENT:   72 y.o. right-handed male with history of hypertension, memory deficits maintained on Aricept and Namenda, prostate cancer, PTSD, chronic back pain with multiple lumbar surgeries with recent spinal cord stimulator placement January 2019 as well as treatment for recent UTI.  Presented 03/16/2018 with lethargy/fall as well as hypoxemia with paraparesis.  X-rays and imaging revealed osteomyelitis T10-T11 with epidural abscess. Patient underwent laminectomy T10-T11 decompression of epidural abscess debridement of discitis T10-T11 with segmental fixation from T8-L3 03/28/2018.  Meal completion has been 50-100%. Appetite improved. Pt currently has Ensure ordered with varied consumption. RD to continue with current orders to aid in caloric and protein needs. Pt encouraged to eat his food at meals.   Diet Order:  Diet regular Room service appropriate? Yes; Fluid consistency: Thin Fall precautions  EDUCATION NEEDS:   No education needs have been identified at this time  Skin:  Skin Assessment: Skin Integrity Issues: Skin Integrity Issues:: Stage II Stage II: coccyx  Last BM:  4/22  Height:   Ht Readings from Last 1 Encounters:  03/30/18 _0  (1.905 m)    Weight:   Wt Readings from Last 1 Encounters:  04/13/18 277 lb 5.4 oz (125.8 kg)    Ideal Body Weight:  89 kg  BMI:  Body mass index is 34.66 kg/m.  Estimated Nutritional Needs:    Kcal:  2150-2350  Protein:  105-120 grams  Fluid:  >/= 2.1 L/day    Corrin Parker, MS, RD, LDN Pager # [phone] After hours/ weekend pager # [phone]

## 2018-04-13 NOTE — Progress Notes (Signed)
Occupational Therapy Session Note  Patient Details  Name: Cody Velez MRN: [id number] Date of Birth: 1946/09/26  Today's Date: 04/13/2018 OT Individual Time: 0900-1015 OT Individual Time Calculation (min): 75 min    Short Term Goals: Week 2:  OT Short Term Goal 1 (Week 2): Pt will perform BSC squat pivot transfer with max A. OT Short Term Goal 2 (Week 2): Pt will thread pants over BLE sitting EOB using AE PRN with max A OT Short Term Goal 3 (Week 2): Pt will pull up pants sit<>stand in Stedy with max A  Skilled Therapeutic Interventions/Progress Updates:    Treatment session focused on ADLs/self care training, transfer training, energy conservation techniques, and pt education on safety awareness. Pt agreeable to AM ADLs. Sat EOB with CGA to don TLSO with max A. Transferred into 3-1 BSC to shower on STEDY with min A and v/c for hand/foot placement. Noted F+ trunk control throughout task. Pt instructed on energy conservation techniques throughout d/b. Toileting completed on BSC during showering routine. Additional time needed for toileting task. Pt completed several sit<>stands on steady throughout d/b and toileting resulting in significant fatigue. Required S for UB dressing/max A for TLSO, and max A for LB dressing. Pt transferred to w/c from [facility] with STEDY and repositioned in w/c. No c/o pain. Pt left resting for nursing staff to assist pt with transfer into bed after resting for a while per request with needs met and girlfriend present.   Therapy Documentation Precautions:  Precautions Precautions: Fall, Back Precaution Booklet Issued: No Precaution Comments: Pt needed min cues to recall 3/3 back precautions Required Braces or Orthoses: Spinal Brace Spinal Brace: Thoracolumbosacral orthotic, Applied in sitting position Restrictions Weight Bearing Restrictions: No  Pain: Pain Assessment Pain Score: 0-No pain ADL: ADL ADL Comments: Please see functional navigator  See  Function Navigator for Current Functional Status.   Therapy/Group: Individual Therapy  Delon Sacramento 04/13/2018, 12:30 PM

## 2018-04-13 NOTE — Progress Notes (Addendum)
Physical Therapy Note  Patient Details  Name: BARRIE WALE MRN: [id number] Date of Birth: 1946-07-02 Today's Date: 04/13/2018   [id number], 60 min individual tx Pain: 7/10 low back; premedicated Lillia Corporal [name] present and assisted when requested.  Seated in w/c therapeutic activity: kicking beach ball with R/L LEs , with more power and coordination noted with LLE.  Neuromuscular re-education via demo, multimodal cues for: seated bil hip adduction against resistance of bolster x 10 x 2, bil glut sets 10 x 1. Self -stretching with set- up using foot stool, seated in w/c, x 30 seconds  X 3 for hamstrings and heel cords (via active DF) R/L.  Sit>< stand using Jeralene Huff with supervision, pulling up with bil hands, x 9 to place playing cards on vertical board.  Pt rested in sitting, then stood statically x 30 seconds to place 5 cards in a row on board; cues to extend knees as they began to buckle.   W/c propulsion using bil LEs with mod assist as feet barely reached floor, even wearing shoes, but exhibiting functional alternating reciprocal movement.    Pt fatigued, and requested getting back to bed.  Use of Stedy to return to bed.  Pt able to unstrap 2/3 straps on L side of TLSO.  Mod assist to lift bil LEs onto bed.    Pt left resting in bed with alarm set, needs at hand and [name] in room.  See function navigator for current status.  Valon Glasscock 04/13/2018, 3:24 PM

## 2018-04-13 NOTE — Plan of Care (Signed)
  Problem: Consults Goal: RH SPINAL CORD INJURY PATIENT EDUCATION Description Pt able to verbalize and initiate education regarding spinal cord injury with MOD I assist   Outcome: Progressing Goal: Skin Care Protocol Initiated - if Braden Score 18 or less Description Pt will have improved skin breakdown while in rehab with min assist   Outcome: Progressing   Problem: SCI BOWEL ELIMINATION Goal: RH STG MANAGE BOWEL WITH ASSISTANCE Description STG Manage Bowel with min Assistance.  Outcome: Progressing Goal: RH STG SCI MANAGE BOWEL WITH MEDICATION WITH ASSISTANCE Description STG SCI Manage bowel with medication with Min assistance.  Outcome: Progressing   Problem: SCI BLADDER ELIMINATION Goal: RH STG MANAGE BLADDER WITH ASSISTANCE Description STG Manage Bladder With min Assistance  Outcome: Progressing   Problem: RH SKIN INTEGRITY Goal: RH STG SKIN FREE OF INFECTION/BREAKDOWN Description Skin will remain free from breakdown with min assist  Outcome: Progressing Goal: RH STG MAINTAIN SKIN INTEGRITY WITH ASSISTANCE Description STG Maintain Skin Integrity With min Assistance.  Outcome: Progressing   Problem: RH SAFETY Goal: RH STG ADHERE TO SAFETY PRECAUTIONS W/ASSISTANCE/DEVICE Description STG Adhere to Safety Precautions With min  Assistance/Device.  Outcome: Progressing   Problem: RH PAIN MANAGEMENT Goal: RH STG PAIN MANAGED AT OR BELOW PT'S PAIN GOAL Description Pain goal is less than 4 with min assist of PRN medications   Outcome: Progressing

## 2018-04-13 NOTE — Progress Notes (Signed)
Physical Therapy Session Note  Patient Details  Name: Cody Velez MRN: [id number] Date of Birth: 08-03-1946  Today's Date: 04/13/2018 PT Individual Time: 1300-1400 PT Individual Time Calculation (min): 60 min   Short Term Goals: Week 2:  PT Short Term Goal 1 (Week 2): Pt will demonstrate sit<>stand with RW and max assist +1 PT Short Term Goal 2 (Week 2): pt will initiate pre-gait training with PT PT Short Term Goal 3 (Week 2): Pt will propel manual w/c x50' with supervision  Skilled Therapeutic Interventions/Progress Updates:    c/o 7/10 pain at rest and fatigue, but agreeable to therapy session.  Reports had some medication before therapist entered.  Session focus on LE strengthening and muscle endurance during transfers and standing.  Pt requires supervision and verbal cues for supine>sit. TLSO and socks/shoes donned total assist for time management.  Used stedy to transfer to w/c, pt requires only supervision for sit<>stand in stedy.  In therapy gym, pt completes 4 trials (rest breaks between trials) of sit<>stand with Sara+, sling attached for safety, but left on slack and pt was able to stand up with machine without use of sling.  Pt tolerated up to 2 minutes of standing with progressively more UE support on Sara.  Returned to room at end of session and agreeable to sit up with encouragement.  Call bell in reach and needs met.   Therapy Documentation Precautions:  Precautions Precautions: Fall, Back Precaution Booklet Issued: No Precaution Comments: Pt needed min cues to recall 3/3 back precautions Required Braces or Orthoses: Spinal Brace Spinal Brace: Thoracolumbosacral orthotic, Applied in sitting position Restrictions Weight Bearing Restrictions: No   See Function Navigator for Current Functional Status.   Therapy/Group: Individual Therapy  Michel Santee 04/13/2018, 2:52 PM

## 2018-04-14 ENCOUNTER — Inpatient Hospital Stay (HOSPITAL_COMMUNITY): Payer: Federal, State, Local not specified - PPO | Admitting: Occupational Therapy

## 2018-04-14 ENCOUNTER — Other Ambulatory Visit: Payer: Self-pay | Admitting: Pharmacist

## 2018-04-14 ENCOUNTER — Inpatient Hospital Stay (HOSPITAL_COMMUNITY): Payer: Federal, State, Local not specified - PPO | Admitting: Physical Therapy

## 2018-04-14 NOTE — Progress Notes (Signed)
Occupational Therapy Weekly Progress Note  Patient Details  Name: Cody Velez MRN: [id number] Date of Birth: [birth year]  Beginning of progress report period: April 07, 2018 End of progress report period: April 14, 2018  Today's Date: 04/14/2018 OT Individual Time: 1130-1200 OT Individual Time Calculation (min): 30 min    Patient has met 3 of 3 short term goals.  Pt making progress towards goals but continues to required mod A for LB ADLs and mod-max A with manual transfers.   Patient continues to demonstrate the following deficits: muscle weakness, decreased coordination and decreased sitting balance and decreased standing balance and therefore will continue to benefit from skilled OT intervention to enhance overall performance with BADL.  Patient progressing toward long term goals..  Continue plan of care.  OT Short Term Goals Week 2:  OT Short Term Goal 1 (Week 2): Pt will perform BSC squat pivot transfer with max A. OT Short Term Goal 1 - Progress (Week 2): Met OT Short Term Goal 2 (Week 2): Pt will thread pants over BLE sitting EOB using AE PRN with max A OT Short Term Goal 2 - Progress (Week 2): Met OT Short Term Goal 3 (Week 2): Pt will pull up pants sit<>stand in Stedy with max A OT Short Term Goal 3 - Progress (Week 2): Met   See week 3 goals   Therapy Documentation Precautions:  Precautions Precautions: Fall, Back Precaution Booklet Issued: No Precaution Comments: Pt needed min cues to recall 3/3 back precautions Required Braces or Orthoses: Spinal Brace Spinal Brace: Thoracolumbosacral orthotic, Applied in sitting position Restrictions Weight Bearing Restrictions: No Vital Signs: Therapy Vitals Pulse Rate: 94 BP: 124/68 Pain: Pain Assessment Pain Score: 0-No pain ADL: ADL ADL Comments: Please see functional navigator Vision   Perception    Praxis   Exercises:   Other Treatments:    See Function Navigator for Current Functional  Status.   Therapy/Group: Individual Therapy  Delon Sacramento 04/14/2018, 12:19 PM

## 2018-04-14 NOTE — Progress Notes (Signed)
Physical Therapy Session Note  Patient Details  Name: Cody Velez MRN: [id number] Date of Birth: Nov 28, 1946  Today's Date: 04/14/2018 PT Individual Time: 1330-1440 PT Individual Time Calculation (min): 70 min   Short Term Goals: Week 2:  PT Short Term Goal 1 (Week 2): Pt will demonstrate sit<>stand with RW and max assist +1 PT Short Term Goal 2 (Week 2): pt will initiate pre-gait training with PT PT Short Term Goal 3 (Week 2): Pt will propel manual w/c x50' with supervision  Skilled Therapeutic Interventions/Progress Updates:    c/o 6/10 pain at rest, RN in to medicate at start of session.  Session focus on functional activity tolerance and transfers.    Pt able to complete supine>sit with log roll and supervision.  TLSO donned total assist for time management.  Lateral scoot to w/c on L with supervision and increased time.  Pt completes 3x sit<>squat from w/c to RW focus on power up through LEs and controlled descent.  Sit<>stand with RW x3, mod assist for lifting, and therapist blocking L knee in case of buckling.  Pt able to tolerate standing at RW up to 15 seconds with progressive reliance on UEs to maintain position.  Pt also with forward bias in standing.  Returned to room 2/2 pt urge for BM.  Stedy to transfer to [facility] for time, (-) BM or void.  Standing balance in stedy focus on pt SUE support to pull pants up with increased time and ultimately total assist 2/2 fatigue.  Discussed out of bed tolerance in w/c, and ended session with pt in recliner, with pt reporting increased comfort and likely increased sitting tolerance.  Call bell in reach and significant other at bedside.    Therapy Documentation Precautions:  Precautions Precautions: Fall, Back Precaution Booklet Issued: No Precaution Comments: Pt needed min cues to recall 3/3 back precautions Required Braces or Orthoses: Spinal Brace Spinal Brace: Thoracolumbosacral orthotic, Applied in sitting position Restrictions Weight  Bearing Restrictions: No   See Function Navigator for Current Functional Status.   Therapy/Group: Individual Therapy  Michel Santee 04/14/2018, 2:50 PM

## 2018-04-14 NOTE — Progress Notes (Signed)
Occupational Therapy Session Note  Patient Details  Name: Cody Velez MRN: [id number] Date of Birth: 1946-11-08  Today's Date: 04/14/2018 OT Individual Time: 1130-1200; 730-830 OT Individual Time Calculation (min): 30 min ; 60 min   Short Term Goals: Week 2:  OT Short Term Goal 1 (Week 2): Pt will perform BSC squat pivot transfer with max A. OT Short Term Goal 2 (Week 2): Pt will thread pants over BLE sitting EOB using AE PRN with max A OT Short Term Goal 3 (Week 2): Pt will pull up pants sit<>stand in Stedy with max A  Skilled Therapeutic Interventions/Progress Updates:    session 1 : treatment focused on ADLs/self care training, transfer training, pt education on energy conservation and safety techniques. Pt agreeable to AM ADLs. Pt completed bed mob with S usng bed rail. TLSO done in sitting with max A by therapist. STEDY for transfer from bed<>BSC in shower. Instructed on energy conservation techniques and precaution during shower to avoid twisting. Pt completed UB d/b with S. Required mod A for LB washing. Therapist instructed pt on using AE for LB dressing. Able to loop pants but required assistance to pull them up while in standing with STEDY. Pt moved from EOB sit to supine lying with mod A for LB guiding. Pt completed bed mobility to return to bed to rest with rolling L<>R with use of grab bars. Noted L LE weakness. Pt left resting in bed with needs met.   Session 2: treatment focused on transfer training and pt education to work on mobility in the room. Pt moved from supine lying to EOB sitting with S using bedrails. Therapist instructed pt on precautions again and TLSO brace was applied. Pt instructed on SPT EOB<>w/c. Pt completed transfer with mod A for LE blocking, guiding and lift. Therapist instructed pt on hand/foot placement. Continues to need UB/LB strength to increase SPT ability. Pt returned supine lying to rest per request with needs met.   Therapy  Documentation Precautions:  Precautions Precautions: Fall, Back Precaution Booklet Issued: No Precaution Comments: Pt needed min cues to recall 3/3 back precautions Required Braces or Orthoses: Spinal Brace Spinal Brace: Thoracolumbosacral orthotic, Applied in sitting position Restrictions Weight Bearing Restrictions: No   Vital Signs: Therapy Vitals Pulse Rate: 94 BP: 124/68 Pain: Pain Assessment Pain Score: 0-No pain ADL: ADL ADL Comments: Please see functional navigator  See Function Navigator for Current Functional Status.   Therapy/Group: Individual Therapy  Delon Sacramento 04/14/2018, 12:18 PM

## 2018-04-14 NOTE — Progress Notes (Signed)
Occupational Therapy Session Note  Patient Details  Name: Cody Velez MRN: [id number] Date of Birth: 09/24/1946  Today's Date: 04/14/2018 OT Individual Time: 0930-1030 OT Individual Time Calculation (min): 60 min    Short Term Goals: Week 2:  OT Short Term Goal 1 (Week 2): Pt will perform BSC squat pivot transfer with max A. OT Short Term Goal 2 (Week 2): Pt will thread pants over BLE sitting EOB using AE PRN with max A OT Short Term Goal 3 (Week 2): Pt will pull up pants sit<>stand in Stedy with max A  Skilled Therapeutic Interventions/Progress Updates:    Pt seen this session to work on functional mobility skills related to self care and general strengthening.  Pt received in bed and sat to EOB with min A.  Attempted use of sock aide but pt's heel would not slide in well even with assist from therapist.  Therapist donned pt's shoes and socks.  The wide drop arm BSC placed next to pt's bed. Pt was able to accomplish 3 small scoots from EOB onto [name] with max A and max A to the bed.  Because the surface of the BSC was flat and hard, pt did well with the scoots. We then tried a squat pivot to the w/c but pt was not able to lift his hips up high enough and the chair began to slide.  His girlfriend stabilized the w/c and pt then completed the transfer with a HEAVY max A.  He then completed grooming at the sink. Pt taken to day room to work on arm bike for 8 min at resistance 5.  Pt wanted to get back to bed.  Used Stedy lift with S to stand in lift and pt transferred to bed.   A to lift both legs into the bed.  Pt adjusted in bed with all needs met.     Therapy Documentation Precautions:  Precautions Precautions: Fall, Back Precaution Booklet Issued: No Precaution Comments: Pt needed min cues to recall 3/3 back precautions Required Braces or Orthoses: Spinal Brace Spinal Brace: Thoracolumbosacral orthotic, Applied in sitting position Restrictions Weight Bearing Restrictions: No    Vital  Signs: Therapy Vitals Pulse Rate: 94 BP: 124/68 Pain: Pain Assessment Pain Score: 0-No pain ADL: ADL ADL Comments: Please see functional navigator    See Function Navigator for Current Functional Status.   Therapy/Group: Individual Therapy  [place] 04/14/2018, 12:16 PM

## 2018-04-14 NOTE — Progress Notes (Signed)
Subjective/Complaints: Doing fairly well this morning. Just bathed. Left shoulder feels better. Ice helped  ROS: Patient denies fever, rash, sore throat, blurred vision, nausea, vomiting, diarrhea, cough, shortness of breath or chest pain, joint or back pain, headache, or mood change.     Objective: Vital Signs: Blood pressure 124/68, pulse 94, temperature 97.8 F (36.6 C), temperature source Oral, resp. rate 18, height 6\' 3"  (1.905 m), weight 105.8 kg (233 lb 4 oz), SpO2 99 %. No results found. Results for orders placed or performed during the hospital encounter of 03/30/18 (from the past 72 hour(s))  CBC     Status: Abnormal   Collection Time: 04/13/18  4:24 AM  Result Value Ref Range   WBC 6.9 4.0 - 10.5 K/uL   RBC 3.41 (L) 4.22 - 5.81 MIL/uL   Hemoglobin 8.8 (L) 13.0 - 17.0 g/dL   HCT 29.8 (L) 39.0 - 52.0 %   MCV 87.4 78.0 - 100.0 fL   MCH 25.8 (L) 26.0 - 34.0 pg   MCHC 29.5 (L) 30.0 - 36.0 g/dL   RDW 17.4 (H) 11.5 - 15.5 %   Platelets 685 (H) 150 - 400 K/uL    Comment: Performed at [facility], [address] [place], [place] [address]     Constitutional: No distress . Vital signs reviewed. HEENT: EOMI, oral membranes moist Neck: supple Cardiovascular: RRR without murmur. No JVD    Respiratory: CTA Bilaterally without wheezes or rales. Normal effort    GI: BS +, non-tender, non-distended   Neuro: Alert and Oriented Sensation diminished to light touch below umbilicus Motor: 5/5 in BUE proximal to distal RLE: hip flexion 2+/5, knee extension 3+/5, ankle dorsiflexion 3+ tp 4/5 LLE: Hip flexion  1+ to 2/5, KE 3/5 ankle dorsiflexion 3 -/5--neuro  Musc/Skel: left shoulder moving more freely Skin: back incision remains intact       Assessment/Plan: 1. Functional deficits secondary to paraplegia secondary to epidural abscess which require 3+ hours per day of interdisciplinary therapy in a comprehensive inpatient rehab setting. Physiatrist is providing close team  supervision and 24 hour management of active medical problems listed below. Physiatrist and rehab team continue to assess barriers to discharge/monitor patient progress toward functional and medical goals. FIM: Function - Bathing Bathing activity did not occur: Refused Position: Shower Body parts bathed by patient: Right arm, Left arm, Chest, Abdomen, Front perineal area, Left upper leg, Right upper leg, Buttocks Body parts bathed by helper: Right lower leg, Left lower leg Bathing not applicable: Buttocks, Right upper leg, Left upper leg, Right lower leg, Left lower leg, Back Assist Level: Touching or steadying assistance(Pt > 75%)  Function- Upper Body Dressing/Undressing Upper body dressing/undressing activity did not occur: Refused What is the patient wearing?: Pull over shirt/dress, Orthosis Pull over shirt/dress - Perfomed by patient: Thread/unthread right sleeve, Thread/unthread left sleeve, Put head through opening, Pull shirt over trunk Pull over shirt/dress - Perfomed by helper: Pull shirt over trunk Orthosis activity level: Performed by helper Assist Level: Supervision or verbal cues Function - Lower Body Dressing/Undressing What is the patient wearing?: Underwear, Non-skid slipper socks Position: Other (comment)(w/c with stedy) Pants- Performed by patient: Thread/unthread right pants leg, Thread/unthread left pants leg Pants- Performed by helper: Thread/unthread right pants leg, Thread/unthread left pants leg, Pull pants up/down Non-skid slipper socks- Performed by patient: Don/doff right sock, Don/doff left sock Non-skid slipper socks- Performed by helper: Don/doff right sock, Don/doff left sock Assist for footwear: Dependant Assist for lower body dressing: Touching or  steadying assistance (Pt > 75%)  Function - Toileting Toileting activity did not occur: No continent bowel/bladder event Toileting steps completed by patient: Adjust clothing after toileting Toileting steps  completed by helper: Adjust clothing prior to toileting, Adjust clothing after toileting Assist level: Touching or steadying assistance (Pt.75%)  Function - Toilet Transfers Toilet transfer activity did not occur: N/A Toilet transfer assistive device: Mechanical lift Mechanical lift: Stedy Assist level to toilet: Moderate assist (Pt 50 - 74%/lift or lower) Assist level from toilet: Moderate assist (Pt 50 - 74%/lift or lower)  Function - Chair/bed transfer Chair/bed transfer activity did not occur: N/A Chair/bed transfer method: Other Chair/bed transfer assist level: Touching or steadying assistance (Pt > 75%) Chair/bed transfer assistive device: Mechanical lift Mechanical lift: Stedy Chair/bed transfer details: Verbal cues for technique, Verbal cues for precautions/safety, Manual facilitation for weight shifting  Function - Locomotion: Wheelchair Will patient use wheelchair at discharge?: Yes Type: Manual Wheelchair activity did not occur: N/A Max wheelchair distance: 50 Assist Level: Moderate assistance (Pt 50 - 74%)(using bil LEs) Wheel 50 feet with 2 turns activity did not occur: N/A Assist Level: Moderate assistance (Pt 50 - 74%) Wheel 150 feet activity did not occur: N/A Assist Level: Supervision or verbal cues Turns around,maneuvers to table,bed, and toilet,negotiates 3% grade,maneuvers on rugs and over doorsills: No Function - Locomotion: Ambulation Ambulation activity did not occur: N/A Walk 10 feet activity did not occur: N/A Walk 50 feet with 2 turns activity did not occur: N/A Walk 150 feet activity did not occur: N/A Walk 10 feet on uneven surfaces activity did not occur: N/A  Function - Comprehension Comprehension: Auditory Comprehension assist level: Follows complex conversation/direction with no assist  Function - Expression Expression: Verbal Expression assistive device: Communication board Expression assist level: Expresses complex ideas: With no  assist  Function - Social Interaction Social Interaction assist level: Interacts appropriately with others - No medications needed.  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 Back brace when out of bed. Patient with history of multiple lumbar surgeries as well as spinal cord stimulator January 2019   Continue PT, OT therapies.   2. DVT left soleal, right peroneal DVT's:.     prophylactic doses of Lovenox started on 4/12.    Repeat dopplers next week or two 3. Pain Management:Neurontin 600 mg 3 times daily, Robaxin   as needed   Lidoderm patch started on 4/13   -scheduled tylenol  -oxycodone prn.  -local measures for left shoulder/better technique 4. Mood:Aricept 10 mg nightly, Namenda 10 mg twice daily, Effexor sore 150 mg twice daily, trazodone 100 mg nightly 5. Neuropsych: This patientisfully capable of making decisions on hisown behalf. 6. Skin/Wound Care:staples out per NS 7. Fluids/Electrolytes/Nutrition:Routine I/O's  -supplementing potassium KCl 20 mEq/day.  Potassium 3.8   4/22  - Appetite  improved      8.ID/MSSA bacteremia.Intravenous Ancef 2 g every 8 hours through 05/22/2018 and stop as well as rifampin 300 mg twice daily.    -outpt Follow-up per infectious disease 9.Acute on chronic anemia.     Hemoglobin increased to 8.8 on 4/25    10.Hypertension. Norvasc 10 mg  daily   Vitals:   04/14/18 0907 04/14/18 0911  BP: 124/68 124/68  Pulse:  94  Resp:    Temp:    SpO2:    reasonable  control 11.Hyperlipidemia. Pravachol 12.History of prostate cancer. Proscar 5 mg daily 13.Constipation. Laxative assistance, adjust regimen for more regular movements 14. Urine retention, occasional incontinence seems to be improving  ---Ua negative, ucx negative  LOS (Days) 15 A FACE TO FACE EVALUATION WAS PERFORMED  [name] 04/14/2018, 10:36 AM

## 2018-04-15 DIAGNOSIS — K5901 Slow transit constipation: Secondary | ICD-10-CM

## 2018-04-15 LAB — GLUCOSE, CAPILLARY: Glucose-Capillary: 107 mg/dL — ABNORMAL HIGH (ref 65–99)

## 2018-04-15 MED ORDER — SORBITOL 70 % SOLN
60.0000 mL | Status: AC
Start: 2018-04-15 — End: 2018-04-15
  Administered 2018-04-15: 60 mL via ORAL
  Filled 2018-04-15: qty 60

## 2018-04-15 NOTE — Plan of Care (Signed)
  Problem: RH SAFETY Goal: RH STG ADHERE TO SAFETY PRECAUTIONS W/ASSISTANCE/DEVICE Description STG Adhere to Safety Precautions With min  Assistance/Device.  Outcome: Progressing  Call light within reach, proper footwear

## 2018-04-15 NOTE — Plan of Care (Signed)
  Problem: Consults Goal: RH SPINAL CORD INJURY PATIENT EDUCATION Description Pt able to verbalize and initiate education regarding spinal cord injury with MOD I assist   Outcome: Progressing Goal: Skin Care Protocol Initiated - if Braden Score 18 or less Description Pt will have improved skin breakdown while in rehab with min assist   Outcome: Progressing   Problem: SCI BOWEL ELIMINATION Goal: RH STG MANAGE BOWEL WITH ASSISTANCE Description STG Manage Bowel with min Assistance.  Outcome: Progressing Goal: RH STG SCI MANAGE BOWEL WITH MEDICATION WITH ASSISTANCE Description STG SCI Manage bowel with medication with Min assistance.  Outcome: Progressing   Problem: SCI BLADDER ELIMINATION Goal: RH STG MANAGE BLADDER WITH ASSISTANCE Description STG Manage Bladder With min Assistance  Outcome: Progressing   Problem: RH SKIN INTEGRITY Goal: RH STG SKIN FREE OF INFECTION/BREAKDOWN Description Skin will remain free from breakdown with min assist  Outcome: Progressing Goal: RH STG MAINTAIN SKIN INTEGRITY WITH ASSISTANCE Description STG Maintain Skin Integrity With min Assistance.  Outcome: Progressing   Problem: RH SAFETY Goal: RH STG ADHERE TO SAFETY PRECAUTIONS W/ASSISTANCE/DEVICE Description STG Adhere to Safety Precautions With min  Assistance/Device.  Outcome: Progressing   Problem: RH PAIN MANAGEMENT Goal: RH STG PAIN MANAGED AT OR BELOW PT'S PAIN GOAL Description Pain goal is less than 4 with min assist of PRN medications   Outcome: Progressing

## 2018-04-15 NOTE — Progress Notes (Signed)
Subjective/Complaints: Pain under reasonable control.  Does feel bloated.  Not moving bowels all that well.  ROS: Patient denies fever, rash, sore throat, blurred vision, nausea, vomiting, diarrhea, cough, shortness of breath or chest pain, joint or back pain, headache, or mood change.   Objective: Vital Signs: Blood pressure (!) 110/53, pulse 88, temperature 98.5 F (36.9 C), temperature source Oral, resp. rate 18, height 6\' 3"  (1.905 m), weight 105.8 kg (233 lb 4 oz), SpO2 97 %. No results found. Results for orders placed or performed during the hospital encounter of 03/30/18 (from the past 72 hour(s))  CBC     Status: Abnormal   Collection Time: 04/13/18  4:24 AM  Result Value Ref Range   WBC 6.9 4.0 - 10.5 K/uL   RBC 3.41 (L) 4.22 - 5.81 MIL/uL   Hemoglobin 8.8 (L) 13.0 - 17.0 g/dL   HCT 29.8 (L) 39.0 - 52.0 %   MCV 87.4 78.0 - 100.0 fL   MCH 25.8 (L) 26.0 - 34.0 pg   MCHC 29.5 (L) 30.0 - 36.0 g/dL   RDW 17.4 (H) 11.5 - 15.5 %   Platelets 685 (H) 150 - 400 K/uL    Comment: Performed at [facility], [address], [place], [place] [address]  Glucose, capillary     Status: Abnormal   Collection Time: 04/15/18  6:27 AM  Result Value Ref Range   Glucose-Capillary 107 (H) 65 - 99 mg/dL     Constitutional: No distress . Vital signs reviewed. HEENT: EOMI, oral membranes moist Neck: supple Cardiovascular: RRR without murmur. No JVD    Respiratory: CTA Bilaterally without wheezes or rales. Normal effort    GI: BS +, non-tender, slightly-distended  Neuro: Alert and Oriented Sensation diminished to light touch below umbilicus Motor: 5/5 in BUE proximal to distal RLE: hip flexion 2+/5, knee extension 3+/5, ankle dorsiflexion 3+ tp 4/5 LLE: Hip flexion  1+ to 2/5, KE 3/5 ankle dorsiflexion 3 -/5--neuro  Musc/Skel: left shoulder moving more freely Skin: back incision remains intact       Assessment/Plan: 1. Functional deficits secondary to paraplegia secondary to  epidural abscess which require 3+ hours per day of interdisciplinary therapy in a comprehensive inpatient rehab setting. Physiatrist is providing close team supervision and 24 hour management of active medical problems listed below. Physiatrist and rehab team continue to assess barriers to discharge/monitor patient progress toward functional and medical goals. FIM: Function - Bathing Bathing activity did not occur: Refused Position: Shower Body parts bathed by patient: Right arm, Left arm, Chest, Abdomen, Front perineal area, Left upper leg, Right upper leg, Buttocks Body parts bathed by helper: Left lower leg, Right lower leg, Back Bathing not applicable: Buttocks, Right upper leg, Left upper leg, Right lower leg, Left lower leg, Back Assist Level: Touching or steadying assistance(Pt > 75%)  Function- Upper Body Dressing/Undressing Upper body dressing/undressing activity did not occur: Refused What is the patient wearing?: Pull over shirt/dress, Orthosis Pull over shirt/dress - Perfomed by patient: Put head through opening, Thread/unthread left sleeve, Thread/unthread right sleeve, Pull shirt over trunk Pull over shirt/dress - Perfomed by helper: Pull shirt over trunk Orthosis activity level: Performed by helper Assist Level: Supervision or verbal cues Function - Lower Body Dressing/Undressing What is the patient wearing?: Pants, Non-skid slipper socks Position: Sitting EOB Pants- Performed by patient: Thread/unthread right pants leg, Thread/unthread left pants leg Pants- Performed by helper: Pull pants up/down Non-skid slipper socks- Performed by patient: Don/doff right sock, Don/doff left sock Non-skid  slipper socks- Performed by helper: Don/doff right sock, Don/doff left sock Assist for footwear: Dependant Assist for lower body dressing: Touching or steadying assistance (Pt > 75%)  Function - Toileting Toileting activity did not occur: No continent bowel/bladder event Toileting  steps completed by patient: Adjust clothing after toileting Toileting steps completed by helper: Adjust clothing prior to toileting, Adjust clothing after toileting Assist level: Touching or steadying assistance (Pt.75%)  Function - Toilet Transfers Toilet transfer activity did not occur: N/A Toilet transfer assistive device: Mechanical lift, Elevated toilet seat/BSC over toilet Mechanical lift: Stedy Assist level to toilet: Dependent (Pt equals 0%) Assist level from toilet: Dependent (Pt equals 0%)  Function - Chair/bed transfer Chair/bed transfer activity did not occur: N/A Chair/bed transfer method: Lateral scoot, Other Chair/bed transfer assist level: Supervision or verbal cues(supervision for lateral scoot to w/c, and supervision for sit<>stand in stedy) Chair/bed transfer assistive device: Mechanical lift Mechanical lift: Stedy Chair/bed transfer details: Verbal cues for technique, Verbal cues for precautions/safety, Manual facilitation for weight shifting  Function - Locomotion: Wheelchair Will patient use wheelchair at discharge?: Yes Type: Manual Wheelchair activity did not occur: N/A Max wheelchair distance: 50 Assist Level: Moderate assistance (Pt 50 - 74%)(using bil LEs) Wheel 50 feet with 2 turns activity did not occur: N/A Assist Level: Moderate assistance (Pt 50 - 74%) Wheel 150 feet activity did not occur: N/A Assist Level: Supervision or verbal cues Turns around,maneuvers to table,bed, and toilet,negotiates 3% grade,maneuvers on rugs and over doorsills: No Function - Locomotion: Ambulation Ambulation activity did not occur: N/A Walk 10 feet activity did not occur: N/A Walk 50 feet with 2 turns activity did not occur: N/A Walk 150 feet activity did not occur: N/A Walk 10 feet on uneven surfaces activity did not occur: N/A  Function - Comprehension Comprehension: Auditory Comprehension assist level: Follows complex conversation/direction with no  assist  Function - Expression Expression: Verbal Expression assistive device: Communication board Expression assist level: Expresses complex ideas: With no assist  Function - Social Interaction Social Interaction assist level: Interacts appropriately with others - No medications needed.  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 Back brace when out of bed. Patient with history of multiple lumbar surgeries as well as spinal cord stimulator January 2019   Continue PT, OT therapies.   2. DVT left soleal, right peroneal DVT's:.     prophylactic doses of Lovenox started on 4/12.    Repeat dopplers in next week or two 3. Pain Management:Neurontin 600 mg 3 times daily, Robaxin   as needed   Lidoderm patch started on 4/13   -scheduled tylenol  -oxycodone prn.  -local measures for left shoulder/better technique with transfers, etc. 4/27 4. Mood:Aricept 10 mg nightly, Namenda 10 mg twice daily, Effexor sore 150 mg twice daily, trazodone 100 mg nightly 5. Neuropsych: This patientisfully capable of making decisions on hisown behalf. 6. Skin/Wound Care:staples out per NS 7. Fluids/Electrolytes/Nutrition:Routine I/O's  -supplementing potassium KCl 20 mEq/day.  Potassium 3.8   4/22  - Appetite  improved      8.ID/MSSA bacteremia.Intravenous Ancef 2 g every 8 hours through 05/22/2018 and stop as well as  rifampin 300 mg twice daily.    -outpt Follow-up per infectious disease 9.Acute on chronic anemia.     Hemoglobin  increased to 8.8 on 4/25    10.Hypertension. Norvasc 10 mg daily   Vitals:   04/15/18 0254 04/15/18 0817  BP: 122/73 (!) 110/53  Pulse: 88   Resp: 18   Temp: 98.5 F (36.9 C)   SpO2: 97%   reasonable control  11.Hyperlipidemia. Pravachol 12.History of prostate cancer. Proscar 5 mg daily 13.Constipation. Laxative assistance sorbitol today, Dulcolax suppository if no results .Marland Kitchen  14. Urine retention, occasional incontinence seems to be improving  ---Ua negative, ucx negative  LOS (Days) 16 A FACE TO FACE EVALUATION WAS PERFORMED  [name] 04/15/2018, 8:53 AM

## 2018-04-16 ENCOUNTER — Inpatient Hospital Stay (HOSPITAL_COMMUNITY): Payer: Federal, State, Local not specified - PPO

## 2018-04-16 NOTE — Progress Notes (Signed)
Patient resting comfortably on home CPAP unit at this time. Wife at bedside. No assistance needed.

## 2018-04-16 NOTE — Progress Notes (Signed)
Occupational Therapy Session Note  Patient Details  Name: Cody Velez MRN: [id number] Date of Birth: [birth year]  Today's Date: 04/16/2018 OT Individual Time: 9381-0175 OT Individual Time Calculation (min): 45 min    Short Term Goals: Week 1:  OT Short Term Goal 1 (Week 1): Pt will tolerate sitting EOB for 5 mins in preparation for BADL tasks. OT Short Term Goal 2 (Week 1): Pt will direct care to don TLSO with min A from caregiver OT Short Term Goal 3 (Week 1): Pt will maintain sitting balance at EOB with no more than mod A in preparation for BADL tasks OT Short Term Goal 4 (Week 1): Pt will recall 3/3 back precautions without cues  Skilled Therapeutic Interventions/Progress Updates:    1:1. Pt with 6/10 pain and RN just administered medication. Pt completes lateral scoot transfer with OT blocking R foot from sliding and Vc for hand placement. Pt grooms at sink with set up to obtain items. Pt dons pull over shirt and TLSO with set up and A to place brace around back. Pt dons patns with reacher threading BLE into pants with Vc for technique. Pt sit to stand in stedy with supervision and OT A to advance pants past hips. Pt transfers back to bed in stedy and removes socks with reacher. Exited sessio nwiht pt semi reclined in bed and call light in reach and abed exit on  Therapy Documentation Precautions:  Precautions Precautions: Fall, Back Precaution Booklet Issued: No Precaution Comments: Pt needed min cues to recall 3/3 back precautions Required Braces or Orthoses: Spinal Brace Spinal Brace: Thoracolumbosacral orthotic, Applied in sitting position Restrictions Weight Bearing Restrictions: No  Functional Status.   Therapy/Group: Individual Therapy  Tonny Branch 04/16/2018, 9:47 AM

## 2018-04-16 NOTE — Progress Notes (Signed)
Subjective/Complaints: Feels better today after BM. Less bloated.   ROS: Patient denies fever, rash, sore throat, blurred vision, nausea, vomiting, diarrhea, cough, shortness of breath or chest pain, joint or back pain, headache, or mood change.   Objective: Vital Signs: Blood pressure (!) 152/80, pulse 96, temperature 98.3 F (36.8 C), temperature source Oral, resp. rate 18, height 6\' 3"  (1.905 m), weight 108.3 kg (238 lb 12.1 oz), SpO2 95 %. No results found. Results for orders placed or performed during the hospital encounter of 03/30/18 (from the past 72 hour(s))  Glucose, capillary     Status: Abnormal   Collection Time: 04/15/18  6:27 AM  Result Value Ref Range   Glucose-Capillary 107 (H) 65 - 99 mg/dL     Constitutional: No distress . Vital signs reviewed. HEENT: EOMI, oral membranes moist Neck: supple Cardiovascular: RRR without murmur. No JVD    Respiratory: CTA Bilaterally without wheezes or rales. Normal effort    GI: BS +, non-tender, slightly-distended  Neuro: Alert and Oriented Sensation diminished to light touch below umbilicus Motor: 5/5 in BUE proximal to distal RLE: hip flexion 2+/5, knee extension 3+/5, ankle dorsiflexion 3+ tp 4/5 LLE: Hip flexion  1+ to 2/5, KE 3/5 ankle dorsiflexion 3 -/5--neuro  Musc/Skel: left shoulder moving more freely Skin: back incision remains intact       Assessment/Plan: 1. Functional deficits secondary to paraplegia secondary to epidural abscess which require 3+ hours per day of interdisciplinary therapy in a comprehensive inpatient rehab setting. Physiatrist is providing close team supervision and 24 hour management of active medical problems listed below. Physiatrist and rehab team continue to assess barriers to discharge/monitor patient progress toward functional and medical goals. FIM: Function - Bathing Bathing activity did not occur: Refused Position: Shower Body parts bathed by patient: Right arm, Left arm, Chest,  Abdomen, Front perineal area, Left upper leg, Right upper leg, Buttocks Body parts bathed by helper: Left lower leg, Right lower leg, Back Bathing not applicable: Buttocks, Right upper leg, Left upper leg, Right lower leg, Left lower leg, Back Assist Level: Touching or steadying assistance(Pt > 75%)  Function- Upper Body Dressing/Undressing Upper body dressing/undressing activity did not occur: Refused What is the patient wearing?: Pull over shirt/dress, Orthosis Pull over shirt/dress - Perfomed by patient: Put head through opening, Thread/unthread left sleeve, Thread/unthread right sleeve, Pull shirt over trunk Pull over shirt/dress - Perfomed by helper: Pull shirt over trunk Orthosis activity level: Performed by helper Assist Level: Supervision or verbal cues Function - Lower Body Dressing/Undressing What is the patient wearing?: Pants, Non-skid slipper socks Position: Sitting EOB Pants- Performed by patient: Thread/unthread right pants leg, Thread/unthread left pants leg Pants- Performed by helper: Pull pants up/down Non-skid slipper socks- Performed by patient: Don/doff right sock, Don/doff left sock Non-skid slipper socks- Performed by helper: Don/doff right sock, Don/doff left sock Assist for footwear: Dependant Assist for lower body dressing: Touching or steadying assistance (Pt > 75%)  Function - Toileting Toileting activity did not occur: No continent bowel/bladder event Toileting steps completed by patient: Adjust clothing after toileting Toileting steps completed by helper: Adjust clothing prior to toileting, Adjust clothing after toileting Assist level: Touching or steadying assistance (Pt.75%)  Function - Air cabin crew transfer activity did not occur: N/A Toilet transfer assistive device: Mechanical lift, Elevated toilet seat/BSC over toilet Mechanical lift: Stedy Assist level to toilet: Dependent (Pt equals 0%) Assist level from toilet: Dependent (Pt equals  0%)  Function - Chair/bed transfer Chair/bed transfer activity did not occur:  N/A Chair/bed transfer method: Lateral scoot, Other Chair/bed transfer assist level: Supervision or verbal cues(supervision for lateral scoot to w/c, and supervision for sit<>stand in stedy) Chair/bed transfer assistive device: Mechanical lift Mechanical lift: Stedy Chair/bed transfer details: Verbal cues for technique, Verbal cues for precautions/safety, Manual facilitation for weight shifting  Function - Locomotion: Wheelchair Will patient use wheelchair at discharge?: Yes Type: Manual Wheelchair activity did not occur: N/A Max wheelchair distance: 50 Assist Level: Moderate assistance (Pt 50 - 74%)(using bil LEs) Wheel 50 feet with 2 turns activity did not occur: N/A Assist Level: Moderate assistance (Pt 50 - 74%) Wheel 150 feet activity did not occur: N/A Assist Level: Supervision or verbal cues Turns around,maneuvers to table,bed, and toilet,negotiates 3% grade,maneuvers on rugs and over doorsills: No Function - Locomotion: Ambulation Ambulation activity did not occur: N/A Walk 10 feet activity did not occur: N/A Walk 50 feet with 2 turns activity did not occur: N/A Walk 150 feet activity did not occur: N/A Walk 10 feet on uneven surfaces activity did not occur: N/A  Function - Comprehension Comprehension: Auditory Comprehension assist level: Follows complex conversation/direction with extra time/assistive device  Function - Expression Expression: Verbal Expression assistive device: Communication board Expression assist level: Expresses complex ideas: With no assist  Function - Social Interaction Social Interaction assist level: Interacts appropriately with others - No medications needed.  Function - Problem Solving Problem solving assist level: Solves complex problems: Recognizes & self-corrects  Function - Memory Memory assist level: Complete Independence: No helper Patient normally able  to recall (first 3 days only): Current season, Staff names and faces, That he or she is in a hospital  Medical Problem List and Plan: 1.Decreased functional mobilitysecondary to T10-11 osteomyelitis/epidural abscess with subsequent myelopathy/paraplegia, status post decompression and fusion T8-L3. Back brace when out of bed. Patient with history of multiple lumbar surgeries as well as spinal cord stimulator January 2019   Continue PT, OT therapies.   2. DVT left soleal, right peroneal DVT's:.     prophylactic doses of Lovenox started on 4/12.    Repeat dopplers in next week or two 3. Pain Management:Neurontin 600 mg 3 times daily, Robaxin   as needed   Lidoderm patch started on 4/13   -scheduled tylenol  -oxycodone prn.  -local measures for left shoulder/better technique with transfers, etc. 4. Mood:Aricept 10 mg nightly, Namenda 10 mg twice daily, Effexor sore 150 mg twice daily, trazodone 100 mg nightly 5. Neuropsych: This patientisfully capable of making decisions on hisown behalf. 6. Skin/Wound Care:staples out per NS 7. Fluids/Electrolytes/Nutrition:Routine I/O's  -supplementing potassium KCl 20 mEq/day.  Potassium 3.8   4/22  - Appetite  improved      8.ID/MSSA bacteremia.Intravenous Ancef 2 g every 8 hours through 05/22/2018 and stop as well as rifampin 300 mg twice daily.    -outpt Follow-up per infectious disease 9.Acute on chronic anemia.     Hemoglobin increased to 8.8 on 4/25---recheck this week    10.Hypertension. Norvasc 10 mg daily   Vitals:   04/16/18 0818 04/16/18 0819  BP: (!) 152/80 (!) 152/80  Pulse: 96   Resp:    Temp:    SpO2:    reasonable control at present 11.Hyperlipidemia. Pravachol 12.History of prostate cancer. Proscar 5 mg daily 13.Constipation. Laxative assistance sorbitol today, Dulcolax suppository if no results .Marland Kitchen  14. Urine retention, occasional incontinence seems to be improving  ---Ua negative, ucx  negative  LOS (Days) 17 A FACE TO FACE EVALUATION WAS PERFORMED  Alroy Dust T  Naaman Plummer 04/16/2018, 9:30 AM

## 2018-04-16 NOTE — Plan of Care (Signed)
  Problem: Consults Goal: Skin Care Protocol Initiated - if Braden Score 18 or less Description Pt will have improved skin breakdown while in rehab with min assist   Outcome: Progressing   Problem: SCI BOWEL ELIMINATION Goal: RH STG SCI MANAGE BOWEL WITH MEDICATION WITH ASSISTANCE Description STG SCI Manage bowel with medication with Min assistance.  Outcome: Progressing   Problem: RH SKIN INTEGRITY Goal: RH STG MAINTAIN SKIN INTEGRITY WITH ASSISTANCE Description STG Maintain Skin Integrity With min Assistance.  Outcome: Progressing   Problem: RH PAIN MANAGEMENT Goal: RH STG PAIN MANAGED AT OR BELOW PT'S PAIN GOAL Description Pain goal is less than 4 with min assist of PRN medications   Outcome: Progressing

## 2018-04-17 ENCOUNTER — Inpatient Hospital Stay (HOSPITAL_COMMUNITY): Payer: Medicare Other

## 2018-04-17 ENCOUNTER — Inpatient Hospital Stay (HOSPITAL_COMMUNITY): Payer: Federal, State, Local not specified - PPO

## 2018-04-17 ENCOUNTER — Inpatient Hospital Stay (HOSPITAL_COMMUNITY): Payer: Federal, State, Local not specified - PPO | Admitting: Physical Therapy

## 2018-04-17 MED ORDER — POLYETHYLENE GLYCOL 3350 17 G PO PACK
17.0000 g | PACK | Freq: Every day | ORAL | Status: DC
  Administered 2018-04-18 – 2018-04-21 (×4): 17 g via ORAL
  Filled 2018-04-17 (×4): qty 1

## 2018-04-17 NOTE — Progress Notes (Signed)
Physical Therapy Session Note  Patient Details  Name: Cody Velez MRN: [id number] Date of Birth: 07-Aug-1946  Today's Date: 04/17/2018 PT Individual Time: 0800-0900 PT Individual Time Calculation (min): 60 min   Short Term Goals: Week 2:  PT Short Term Goal 1 (Week 2): Pt will demonstrate sit<>stand with RW and max assist +1 PT Short Term Goal 2 (Week 2): pt will initiate pre-gait training with PT PT Short Term Goal 3 (Week 2): Pt will propel manual w/c x50' with supervision  Skilled Therapeutic Interventions/Progress Updates:    Session focused on bathing and dressing seated EOB and grooming/oral hygiene at sink, functional bed mobility and dynamic sitting balance, and overall functional endurance. Pt able to come to EOB with supervision requiring supervision to min assist for trunk management from sidelying <> sit when performing bed mobility or lateral leans (during initial attempt to pull up pants). Pt able to bathe seated EOB with overall setup assistance and for dynamic sitting balance at supervision to occasional steadying assist (when reaching forward to wash legs) and maintain precautions with min verbal cues. Pt with reports of feeling "weird" and request to lay back down. BP assess in supine and seated positions after rest break (see doc flowsheets) and pt reports improvement in symptoms. Denies dizziness but request to use Stedy instead of lateral transfer due to feeling "off" this morning. Using [place], pt able to transfer with supervision and no increase in symptoms returned. Set up at sink for oral hygiene and then engaged in use of AE for doffing of gripper socks and donning regular socks with extra time and cues for technique. PT donned shoes for time management.   Therapy Documentation Precautions:  Precautions Precautions: Fall, Back Precaution Booklet Issued: No Precaution Comments: Pt needed min cues to recall 3/3 back precautions Required Braces or Orthoses: Spinal  Brace Spinal Brace: Thoracolumbosacral orthotic, Applied in sitting position Restrictions Weight Bearing Restrictions: No Vital Signs: Therapy Vitals BP: 122/86 Patient Position (if appropriate): Sitting Pain: Pain Assessment Pain Score: 5  Pain Type: Acute pain Pain Location: Back Pain Descriptors / Indicators: Aching Pain Intervention(s): Medication (See eMAR)   See Function Navigator for Current Functional Status.   Therapy/Group: Individual Therapy  Canary Brim Ivory Broad, PT, DPT  04/17/2018, 9:12 AM

## 2018-04-17 NOTE — Progress Notes (Signed)
Occupational Therapy Session Note  Patient Details  Name: Cody Velez MRN: [id number] Date of Birth: March 22, 1946  Today's Date: 04/17/2018 OT Individual Time: 0930-1030 OT Individual Time Calculation (min): 60 min    Short Term Goals: Week 3:  OT Short Term Goal 1 (Week 3): Pt will transfer to [facility] with min A  OT Short Term Goal 2 (Week 3): Pt will complete LB bathing with min A using AE PRN OT Short Term Goal 3 (Week 3): Pt will pull pants up><down with mod A  OT Short Term Goal 4 (Week 3): Pt will tolerate standing for 1 minute for grooming at sink with min A   Skilled Therapeutic Interventions/Progress Updates:    Pt resting in w/c upon arrival.  Pt stated he completed washing up and bathing in earlier session.  OT intervention with focus on sit<>stand from EOM with RW and standing balance.  Pt performed sit<>stand X 5 from elevated EOM. Pt maintained standing balance with min A for 15 seconds.  Pt attempted to use RUE for functional task but still requires BUE support when standing.  Pt returned to bed with use of Stedy and max A for bed mobility (sit>supine). Pt remained in bed with all needs within reach and bed alarm activated.   Therapy Documentation Precautions:  Precautions Precautions: Fall, Back Precaution Booklet Issued: No Precaution Comments: Pt needed min cues to recall 3/3 back precautions Required Braces or Orthoses: Spinal Brace Spinal Brace: Thoracolumbosacral orthotic, Applied in sitting position Restrictions Weight Bearing Restrictions: No   Pain: Pain Assessment Pain Score: 5  Pain Type: Acute pain Pain Location: Back Pain Descriptors / Indicators: Aching Pain Intervention(s): Medication (See eMAR)  See Function Navigator for Current Functional Status.   Therapy/Group: Individual Therapy  [name] 04/17/2018, 3:28 PM

## 2018-04-17 NOTE — Progress Notes (Signed)
Physical Therapy Weekly Progress Note  Patient Details  Name: Cody Velez MRN: [id number] Date of Birth: [birth year]  Beginning of progress report period: April 07, 2018 End of progress report period: April 17, 2018  Today's Date: 04/17/2018 PT Individual Time: 1400-1515 PT Individual Time Calculation (min): 75 min   Patient has met 3 of 3 short term goals.  Pt continues to make progress with PT with transfers and sitting balance.  Pt has progressed to sit<>stand transfers with RW but is limited by decreased sensation/proprioception in LEs and requires max assist to maintain static standing balance with RW.    Patient continues to demonstrate the following deficits muscle weakness, decreased cardiorespiratoy endurance, abnormal tone, unbalanced muscle activation and decreased coordination and decreased standing balance, decreased postural control and decreased balance strategies and therefore will continue to benefit from skilled PT intervention to increase functional independence with mobility.  Patient progressing toward long term goals..  Continue plan of care.  PT Short Term Goals Week 2:  PT Short Term Goal 1 (Week 2): Pt will demonstrate sit<>stand with RW and max assist +1 PT Short Term Goal 1 - Progress (Week 2): Met PT Short Term Goal 2 (Week 2): pt will initiate pre-gait training with PT PT Short Term Goal 2 - Progress (Week 2): Met PT Short Term Goal 3 (Week 2): Pt will propel manual w/c x50' with supervision PT Short Term Goal 3 - Progress (Week 2): Met Week 3:  PT Short Term Goal 1 (Week 3): Pt will demonstrate static standing with LRAD and mod assist for maintaining midline orientation (A/P direction).  PT Short Term Goal 2 (Week 3): Pt will propel w/c 100' and supervision for strengthening and cardiovascular endurance  PT Short Term Goal 3 (Week 3): Pt will tolerate static stance x1 minute for decreased burden of care with ADLs PT Short Term Goal 4 (Week 3): Pt will  initiate pre-gait with PT.   Skilled Therapeutic Interventions/Progress Updates:    no c/o pain at rest but does report 5/10 with movement.  Pt's TLSO not fitting as well today, seems to be more able to slide up even when tightened.  Session focus on sit<>stand transfers and standing balance/tolerance.    Pt requires supervision to come to sitting EOB using HOB elevated and bed rails.  PT donned TLSO total assist.  Lateral scoot transfer to w/c on L with increased time and supervision.  Transport to gym total assist for energy conservation.  Pt completes 10 trials of sit<>stand (about 50% successful), focus on achieving A/P midline in standing.  Pt able to transition to standing with min guard, however requires up to max assist to maintain static standing balance due to anterior lean.  Trialed with 7# ankle weights with some improvement in positioning, and trialed with GRAFO on BLEs with no improvement.  Will continue to work towards achieving safe standing position prior to initiating pre-gait.  Pt returned to room at end of session, agreeable to stay out of bed in recliner.  Stedy to transfer to recliner with supervision for sit<>stand.  Positioned to comfort with call bell in reach and needs met.   Therapy Documentation Precautions:  Precautions Precautions: Fall, Back Precaution Booklet Issued: No Precaution Comments: Pt needed min cues to recall 3/3 back precautions Required Braces or Orthoses: Spinal Brace Spinal Brace: Thoracolumbosacral orthotic, Applied in sitting position Restrictions Weight Bearing Restrictions: No   See Function Navigator for Current Functional Status.  Therapy/Group: Individual Therapy  Michel Santee  04/17/2018, 3:26 PM

## 2018-04-17 NOTE — Progress Notes (Signed)
Subjective/Complaints: Left shoulder has been bothering him off and on. Noticed some pain last night. Sleep was broken due to IV abx,bath  ROS: Patient denies fever, rash, sore throat, blurred vision, nausea, vomiting, diarrhea, cough, shortness of breath or chest pain, joint or back pain, headache, or mood change.    Objective: Vital Signs: Blood pressure 132/85, pulse 91, temperature 97.8 F (36.6 C), temperature source Oral, resp. rate 18, height 6\' 3"  (1.905 m), weight 107.5 kg (236 lb 15.9 oz), SpO2 96 %. No results found. Results for orders placed or performed during the hospital encounter of 03/30/18 (from the past 72 hour(s))  Glucose, capillary     Status: Abnormal   Collection Time: 04/15/18  6:27 AM  Result Value Ref Range   Glucose-Capillary 107 (H) 65 - 99 mg/dL     Constitutional: No distress . Vital signs reviewed. HEENT: EOMI, oral membranes moist Neck: supple Cardiovascular: RRR without murmur. No JVD    Respiratory: CTA Bilaterally without wheezes or rales. Normal effort    GI: BS +, non-tender, non-distended  Neuro: Alert and Oriented. Occasional STM deficits (day to day info) Sensation diminished to light touch below umbilicus Motor: 5/5 in BUE proximal to distal RLE: hip flexion 2+/5, knee extension 3+/5, ankle dorsiflexion 3+ tp 4/5 LLE: Hip flexion  1+ to 2/5, KE 3/5 ankle dorsiflexion 3 -/5--neuro  Musc/Skel: left shoulder moving with mild discomfort Skin: back incision remains intact       Assessment/Plan: 1. Functional deficits secondary to paraplegia secondary to epidural abscess which require 3+ hours per day of interdisciplinary therapy in a comprehensive inpatient rehab setting. Physiatrist is providing close team supervision and 24 hour management of active medical problems listed below. Physiatrist and rehab team continue to assess barriers to discharge/monitor patient progress toward functional and medical goals. FIM: Function -  Bathing Bathing activity did not occur: Refused Position: Shower Body parts bathed by patient: Right arm, Left arm, Chest, Abdomen, Front perineal area, Left upper leg, Right upper leg, Buttocks Body parts bathed by helper: Left lower leg, Right lower leg, Back Bathing not applicable: Buttocks, Right upper leg, Left upper leg, Right lower leg, Left lower leg, Back Assist Level: Touching or steadying assistance(Pt > 75%)  Function- Upper Body Dressing/Undressing Upper body dressing/undressing activity did not occur: Refused What is the patient wearing?: Pull over shirt/dress, Orthosis Pull over shirt/dress - Perfomed by patient: Put head through opening, Thread/unthread left sleeve, Thread/unthread right sleeve, Pull shirt over trunk Pull over shirt/dress - Perfomed by helper: Pull shirt over trunk Orthosis activity level: Performed by helper Assist Level: Supervision or verbal cues Function - Lower Body Dressing/Undressing What is the patient wearing?: Pants, Non-skid slipper socks Position: Sitting EOB Pants- Performed by patient: Thread/unthread right pants leg, Thread/unthread left pants leg Pants- Performed by helper: Pull pants up/down Non-skid slipper socks- Performed by patient: Don/doff right sock, Don/doff left sock Non-skid slipper socks- Performed by helper: Don/doff right sock, Don/doff left sock Assist for footwear: Dependant Assist for lower body dressing: Touching or steadying assistance (Pt > 75%)  Function - Toileting Toileting activity did not occur: No continent bowel/bladder event Toileting steps completed by patient: Adjust clothing after toileting Toileting steps completed by helper: Adjust clothing prior to toileting, Adjust clothing after toileting Assist level: Touching or steadying assistance (Pt.75%)  Function - Air cabin crew transfer activity did not occur: N/A Toilet transfer assistive device: Mechanical lift, Elevated toilet seat/BSC over  toilet Mechanical lift: Stedy Assist level to toilet: Dependent (Pt  equals 0%) Assist level from toilet: Dependent (Pt equals 0%)  Function - Chair/bed transfer Chair/bed transfer activity did not occur: N/A Chair/bed transfer method: Lateral scoot, Other Chair/bed transfer assist level: Supervision or verbal cues(supervision for lateral scoot to w/c, and supervision for sit<>stand in stedy) Chair/bed transfer assistive device: Mechanical lift Mechanical lift: Stedy Chair/bed transfer details: Verbal cues for technique, Verbal cues for precautions/safety, Manual facilitation for weight shifting  Function - Locomotion: Wheelchair Will patient use wheelchair at discharge?: Yes Type: Manual Wheelchair activity did not occur: N/A Max wheelchair distance: 50 Assist Level: Moderate assistance (Pt 50 - 74%)(using bil LEs) Wheel 50 feet with 2 turns activity did not occur: N/A Assist Level: Moderate assistance (Pt 50 - 74%) Wheel 150 feet activity did not occur: N/A Assist Level: Supervision or verbal cues Turns around,maneuvers to table,bed, and toilet,negotiates 3% grade,maneuvers on rugs and over doorsills: No Function - Locomotion: Ambulation Ambulation activity did not occur: N/A Walk 10 feet activity did not occur: N/A Walk 50 feet with 2 turns activity did not occur: N/A Walk 150 feet activity did not occur: N/A Walk 10 feet on uneven surfaces activity did not occur: N/A  Function - Comprehension Comprehension: Auditory Comprehension assist level: Follows complex conversation/direction with no assist  Function - Expression Expression: Verbal Expression assistive device: Communication board Expression assist level: Expresses complex ideas: With no assist  Function - Social Interaction Social Interaction assist level: Interacts appropriately with others - No medications needed.  Function - Problem Solving Problem solving assist level: Solves complex problems: Recognizes &  self-corrects  Function - Memory Memory assist level: Complete Independence: No helper Patient normally able to recall (first 3 days only): Current season, Staff names and faces, That he or she is in a hospital  Medical Problem List and Plan: 1.Decreased functional mobilitysecondary to T10-11 osteomyelitis/epidural abscess with subsequent myelopathy/paraplegia, status post decompression and fusion T8-L3. Back brace when out of bed. Patient with history of multiple lumbar surgeries as well as spinal cord stimulator January 2019   Continue PT, OT therapies.  Remains motivated 2. DVT left soleal, right peroneal DVT's:.     prophylactic doses of Lovenox started on 4/12.    Repeat dopplers early next week 3. Pain Management:Neurontin 600 mg 3 times daily, Robaxin   as needed   Lidoderm patch started on 4/13   -scheduled tylenol  -oxycodone prn.  -local measures for left shoulder/better technique with transfers, etc.he's up on his feet and parallel bars which may have been putting more stress on his RTC. 4. Mood:Aricept 10 mg nightly, Namenda 10 mg twice daily, Effexor sore 150 mg twice daily, trazodone 100 mg nightly 5. Neuropsych: This patientisfully capable of making decisions on hisown behalf. 6. Skin/Wound Care:staples out per NS 7. Fluids/Electrolytes/Nutrition:Routine I/O's  -supplementing potassium KCl 20 mEq/day.  Potassium 3.8   4/22  - Appetite  improved   -recheck labs tomorrow   8.ID/MSSA bacteremia.Intravenous Ancef 2 g every 8 hours through 05/22/2018 and stop as well as rifampin 300 mg twice daily.    -outpt Follow-up per infectious disease 9.Acute on chronic anemia.     Hemoglobin increased to 8.8 on 4/25---recheck tomorrow    10.Hypertension. Norvasc 10 mg daily   Vitals:   04/16/18 1414 04/17/18 0252  BP: 135/74 132/85  Pulse: 98 91  Resp: 18 18  Temp: 98.3 F (36.8 C) 97.8 F (36.6 C)  SpO2: 96% 96%  reasonable control at  present 11.Hyperlipidemia. Pravachol 12.History of prostate cancer. Proscar 5 mg  daily 13.Constipation. Laxative assistance sorbitoll   -change miralax to scheduled. Continue senna-s at bedtime..   14. Urine retention, occasional incontinence seems to be improved  ---Ua negative, ucx negative  LOS (Days) 18 A FACE TO FACE EVALUATION WAS PERFORMED  [name] 04/17/2018, 8:38 AM

## 2018-04-18 ENCOUNTER — Inpatient Hospital Stay (HOSPITAL_COMMUNITY): Payer: Federal, State, Local not specified - PPO | Admitting: Occupational Therapy

## 2018-04-18 ENCOUNTER — Inpatient Hospital Stay (HOSPITAL_COMMUNITY): Payer: Federal, State, Local not specified - PPO | Admitting: Physical Therapy

## 2018-04-18 ENCOUNTER — Inpatient Hospital Stay (HOSPITAL_COMMUNITY): Payer: Federal, State, Local not specified - PPO

## 2018-04-18 LAB — BASIC METABOLIC PANEL
Anion gap: 10 (ref 5–15)
BUN: 5 mg/dL — ABNORMAL LOW (ref 6–20)
CO2: 28 mmol/L (ref 22–32)
Calcium: 9 mg/dL (ref 8.9–10.3)
Chloride: 100 mmol/L — ABNORMAL LOW (ref 101–111)
Creatinine, Ser: 0.76 mg/dL (ref 0.61–1.24)
GFR calc Af Amer: >60 mL/min (ref >60)
GFR calc non Af Amer: >60 mL/min (ref >60)
Glucose, Bld: 79 mg/dL (ref 65–99)
Potassium: 3.9 mmol/L (ref 3.5–5.1)
Sodium: 138 mmol/L (ref 135–145)

## 2018-04-18 LAB — CBC
HCT: 31.1 % — ABNORMAL LOW (ref 39.0–52.0)
Hemoglobin: 9 g/dL — ABNORMAL LOW (ref 13.0–17.0)
MCH: 24.9 pg — ABNORMAL LOW (ref 26.0–34.0)
MCHC: 28.9 g/dL — ABNORMAL LOW (ref 30.0–36.0)
MCV: 85.9 fL (ref 78.0–100.0)
Platelets: 722 10*3/uL — ABNORMAL HIGH (ref 150–400)
RBC: 3.62 MIL/uL — ABNORMAL LOW (ref 4.22–5.81)
RDW: 17 % — ABNORMAL HIGH (ref 11.5–15.5)
WBC: 6.7 10*3/uL (ref 4.0–10.5)

## 2018-04-18 NOTE — Progress Notes (Signed)
Occupational Therapy Session Note  Patient Details  Name: Cody Velez MRN: [id number] Date of Birth: 03-Oct-1946  Today's Date: 04/18/2018 OT Individual Time: 4782-9562 OT Individual Time Calculation (min): 75 min  and Today's Date: 04/18/2018 OT Missed Time: 15 Minutes Missed Time Reason: Patient fatigue   Short Term Goals: Week 3:  OT Short Term Goal 1 (Week 3): Pt will transfer to [facility] with min A  OT Short Term Goal 2 (Week 3): Pt will complete LB bathing with min A using AE PRN OT Short Term Goal 3 (Week 3): Pt will pull pants up><down with mod A  OT Short Term Goal 4 (Week 3): Pt will tolerate standing for 1 minute for grooming at sink with min A   Skilled Therapeutic Interventions/Progress Updates:    OT intervention with focus on bathing at shower level and dressing with sit<>stand from EOB.  Pt transfers to [facility] in shower with Stedy.  Pt performs sit<>stand in [place] without assistance but requires BUE support to maintain standing balance for approx 30 seconds.  Pt uses reacher for threading BLE into underpants and shorts but requires assistance pulling up pants when standing.  Pt requires assistance donning and doffing TLSO. Pt fatigues quickly and requires multiple rest breaks throughout session.  Pt required mod A for sit>supine in bed (lifting BLE). Pt remained in bed with all needs within reach.  Pt missed 15 mins skilled OT services 2/2 fatigue.  Pt stated he did not get much rest the previous night.   Therapy Documentation Precautions:  Precautions Precautions: Fall, Back Precaution Booklet Issued: No Precaution Comments: Pt needed min cues to recall 3/3 back precautions Required Braces or Orthoses: Spinal Brace Spinal Brace: Thoracolumbosacral orthotic, Applied in sitting position Restrictions Weight Bearing Restrictions: No General: General OT Amount of Missed Time: 15 Minutes Vital Signs:  Pain: Pt c/o 7/10 pain in back; meds admin prior to therapy,  repositioned ADL: ADL ADL Comments: Please see functional navigator  See Function Navigator for Current Functional Status.   Therapy/Group: Individual Therapy  [name] 04/18/2018, 12:25 PM

## 2018-04-18 NOTE — Progress Notes (Signed)
Physical Therapy Session Note  Patient Details  Name: Cody Velez MRN: [id number] Date of Birth: [birth year]  Today's Date: 04/18/2018 PT Individual Time: 1021-1173 PT Individual Time Calculation (min): 40 min   Short Term Goals: Week 3:  PT Short Term Goal 1 (Week 3): Pt will demonstrate static standing with LRAD and mod assist for maintaining midline orientation (A/P direction).  PT Short Term Goal 2 (Week 3): Pt will propel w/c 100' and supervision for strengthening and cardiovascular endurance  PT Short Term Goal 3 (Week 3): Pt will tolerate static stance x1 minute for decreased burden of care with ADLs PT Short Term Goal 4 (Week 3): Pt will initiate pre-gait with PT.   Skilled Therapeutic Interventions/Progress Updates:   Pt sitting EOB and agreeable to therapy, pain as detailed below. Performed lateral scoot transfer to w/c w/ supervision and increased time. Total assist w/c transport to gym. Attempted to work on standing posture at high/low table w/ game to promote increased trunk extension in stance, however pt did not feel comfortable enough w/ UE support he was getting, he felt his knees were going to buckle. Able to sit<>stand w/ min guard, mod-max assist for standing balance. Pt self-propelled w/c back to room, ~50 w/ LEs and increased time. Total assist remainder of way. Transferred back to EOB via same technique. Ended session in supine, call bell within reach and all needs met.   Therapy Documentation Precautions:  Precautions Precautions: Fall, Back Precaution Booklet Issued: No Precaution Comments: Pt needed min cues to recall 3/3 back precautions Required Braces or Orthoses: Spinal Brace Spinal Brace: Thoracolumbosacral orthotic, Applied in sitting position Restrictions Weight Bearing Restrictions: No Vital Signs: Therapy Vitals Pulse Rate: 97 Resp: 18 BP: 109/60 Patient Position (if appropriate): Sitting Oxygen Therapy SpO2: 98 % O2 Device: Room  Air Pain: Pain Assessment Pain Scale: 0-10 Faces Pain Scale: Hurts little more Pain Type: Acute pain Pain Location: Back Pain Orientation: Lower Pain Descriptors / Indicators: Aching Pain Frequency: Intermittent Pain Onset: On-going Pain Intervention(s): Medication (See eMAR)  See Function Navigator for Current Functional Status.   Therapy/Group: Individual Therapy  Crytal Pensinger K Arnette 04/18/2018, 4:23 PM

## 2018-04-18 NOTE — Progress Notes (Signed)
  Patient has home CPAP unit at bedside.  No assistance needed from RT at this time.

## 2018-04-18 NOTE — Progress Notes (Signed)
Occupational Therapy Session Note  Patient Details  Name: Cody Velez MRN: [id number] Date of Birth: January 14, 1946  Today's Date: 04/18/2018 OT Individual Time: 1305-1400 OT Individual Time Calculation (min): 55 min    Short Term Goals: Week 3:  OT Short Term Goal 1 (Week 3): Pt will transfer to [facility] with min A  OT Short Term Goal 2 (Week 3): Pt will complete LB bathing with min A using AE PRN OT Short Term Goal 3 (Week 3): Pt will pull pants up><down with mod A  OT Short Term Goal 4 (Week 3): Pt will tolerate standing for 1 minute for grooming at sink with min A   Skilled Therapeutic Interventions/Progress Updates:    Pt seen for OT session focusing on ADL re-training and functional mobility. Pt in supine upon arrival, agreeable to tx session and denying pain. He transferred to EOB from flat bed without use of bed rails in simulation of home environment with min A and multimodal cuing for technique.  Seated EOB, he donned B socks with use of sock aid, required intermittent steadying assist for dynamic sitting balance EOB. Completed scoot pivot transfers throughout session with guarding assist and assist to stabilize equipment.  In therapy gym, completed sit>stand from elevated EOM to RW. Completed x5 trials, lowering the mat on subsequent trials, requiring min A overall for balance during transfer. Pt with heavy reliance on B UEs during standing, mirror placed for visual feedback of posture. Longest standing trial 18 seconds before requiring seated rest break. At end of session, completed stand pivot with RW EOM>w/c with +2 assist for safety and max A overall. Pt returned to room and left seated in w/c with all needs in reach and significant other present.   Therapy Documentation Precautions:  Precautions Precautions: Fall, Back Precaution Booklet Issued: No Precaution Comments: Pt needed min cues to recall 3/3 back precautions Required Braces or Orthoses: Spinal Brace Spinal Brace:  Thoracolumbosacral orthotic, Applied in sitting position Restrictions Weight Bearing Restrictions: No Pain:   No/denies pain ADL: ADL ADL Comments: Please see functional navigator  See Function Navigator for Current Functional Status.   Therapy/Group: Individual Therapy  Glorianne Proctor L 04/18/2018, 7:02 AM

## 2018-04-18 NOTE — Progress Notes (Signed)
Subjective/Complaints: No new problems. A little frustrated over slow progress. Denies being depressed  ROS: Patient denies fever, rash, sore throat, blurred vision, nausea, vomiting, diarrhea, cough, shortness of breath or chest pain, joint or back pain, headache, or mood change.     Objective: Vital Signs: Blood pressure (!) 141/73, pulse 93, temperature 98.4 F (36.9 C), temperature source Oral, resp. rate 16, height '6\' 3"'  (1.905 m), weight 123.6 kg (272 lb 7.8 oz), SpO2 94 %. No results found. Results for orders placed or performed during the hospital encounter of 03/30/18 (from the past 72 hour(s))  Basic metabolic panel     Status: Abnormal   Collection Time: 04/18/18  5:35 AM  Result Value Ref Range   Sodium 138 135 - 145 mmol/L   Potassium 3.9 3.5 - 5.1 mmol/L   Chloride 100 (L) 101 - 111 mmol/L   CO2 28 22 - 32 mmol/L   Glucose, Bld 79 65 - 99 mg/dL   BUN 5 (L) 6 - 20 mg/dL   Creatinine, Ser 0.76 0.61 - 1.24 mg/dL   Calcium 9.0 8.9 - 10.3 mg/dL   GFR calc non Af Amer >60 >60 mL/min   GFR calc Af Amer >60 >60 mL/min    Comment: (NOTE) The eGFR has been calculated using the CKD EPI equation. This calculation has not been validated in all clinical situations. eGFR's persistently <60 mL/min signify possible Chronic Kidney Disease.    Anion gap 10 5 - 15    Comment: Performed at [facility] [address] [place], [place] [address]  CBC     Status: Abnormal   Collection Time: 04/18/18  5:35 AM  Result Value Ref Range   WBC 6.7 4.0 - 10.5 K/uL   RBC 3.62 (L) 4.22 - 5.81 MIL/uL   Hemoglobin 9.0 (L) 13.0 - 17.0 g/dL   HCT 31.1 (L) 39.0 - 52.0 %   MCV 85.9 78.0 - 100.0 fL   MCH 24.9 (L) 26.0 - 34.0 pg   MCHC 28.9 (L) 30.0 - 36.0 g/dL   RDW 17.0 (H) 11.5 - 15.5 %   Platelets 722 (H) 150 - 400 K/uL    Comment: Performed at [facility], [place] [address] [place], [place] [address]     Constitutional: No distress . Vital signs reviewed. obese HEENT: EOMI,  oral membranes moist Neck: supple Cardiovascular: RRR without murmur. No JVD    Respiratory: CTA Bilaterally without wheezes or rales. Normal effort    GI: BS +, non-tender, non-distended  Neuro: Alert and Oriented. Occasional STM deficits (day to day info) Sensation diminished to light touch below umbilicus Motor: 5/5 in BUE proximal to distal RLE: hip flexion 2+/5, knee extension 3+/5, ankle dorsiflexion 3+ tp 4/5 LLE: Hip flexion  1+ to 2/5, KE 3/5 ankle dorsiflexion 3 -/5--stab;e  Musc/Skel: left shoulder moving with mild discomfort Skin: back incision  intact       Assessment/Plan: 1. Functional deficits secondary to paraplegia secondary to epidural abscess which require 3+ hours per day of interdisciplinary therapy in a comprehensive inpatient rehab setting. Physiatrist is providing close team supervision and 24 hour management of active medical problems listed below. Physiatrist and rehab team continue to assess barriers to discharge/monitor patient progress toward functional and medical goals. FIM: Function - Bathing Bathing activity did not occur: Refused Position: Sitting EOB Body parts bathed by patient: Right arm, Left arm, Chest, Abdomen, Left upper leg, Right upper leg(perineal area already had been cleaned with new brief)  Body parts bathed by helper: Back Bathing not applicable: Buttocks, Right upper leg, Left upper leg, Right lower leg, Left lower leg, Back Assist Level: Touching or steadying assistance(Pt > 75%) Set up : To obtain items  Function- Upper Body Dressing/Undressing Upper body dressing/undressing activity did not occur: Refused What is the patient wearing?: Pull over shirt/dress, Orthosis Pull over shirt/dress - Perfomed by patient: Put head through opening, Thread/unthread left sleeve, Thread/unthread right sleeve, Pull shirt over trunk Pull over shirt/dress - Perfomed by helper: Pull shirt over trunk Orthosis activity level: Performed by helper Assist  Level: Supervision or verbal cues Function - Lower Body Dressing/Undressing What is the patient wearing?: Pants, Socks, Shoes Position: Sitting EOB Pants- Performed by patient: Thread/unthread right pants leg, Thread/unthread left pants leg Pants- Performed by helper: Thread/unthread right pants leg, Thread/unthread left pants leg, Pull pants up/down, Fasten/unfasten pants Non-skid slipper socks- Performed by patient: Don/doff right sock, Don/doff left sock Non-skid slipper socks- Performed by helper: Don/doff right sock, Don/doff left sock Socks - Performed by patient: Don/doff right sock, Don/doff left sock Shoes - Performed by helper: Fasten right, Fasten left, Don/doff left shoe, Don/doff right shoe Assist for footwear: Dependant Assist for lower body dressing: Touching or steadying assistance (Pt > 75%)  Function - Toileting Toileting activity did not occur: No continent bowel/bladder event Toileting steps completed by patient: Adjust clothing after toileting Toileting steps completed by helper: Adjust clothing prior to toileting, Adjust clothing after toileting Assist level: Touching or steadying assistance (Pt.75%)  Function - Toilet Transfers Toilet transfer activity did not occur: N/A Toilet transfer assistive device: Mechanical lift, Elevated toilet seat/BSC over toilet Mechanical lift: Stedy Assist level to toilet: Dependent (Pt equals 0%) Assist level from toilet: Dependent (Pt equals 0%)  Function - Chair/bed transfer Chair/bed transfer activity did not occur: N/A Chair/bed transfer method: Other Chair/bed transfer assist level: Supervision or verbal cues Chair/bed transfer assistive device: Mechanical lift Mechanical lift: Stedy Chair/bed transfer details: Verbal cues for technique, Verbal cues for precautions/safety, Manual facilitation for weight shifting  Function - Locomotion: Wheelchair Will patient use wheelchair at discharge?: Yes Type: Manual Wheelchair  activity did not occur: N/A Max wheelchair distance: 50 Assist Level: Supervision or verbal cues Wheel 50 feet with 2 turns activity did not occur: N/A Assist Level: Supervision or verbal cues Wheel 150 feet activity did not occur: N/A Assist Level: Supervision or verbal cues Turns around,maneuvers to table,bed, and toilet,negotiates 3% grade,maneuvers on rugs and over doorsills: No Function - Locomotion: Ambulation Ambulation activity did not occur: N/A Walk 10 feet activity did not occur: N/A Walk 50 feet with 2 turns activity did not occur: N/A Walk 150 feet activity did not occur: N/A Walk 10 feet on uneven surfaces activity did not occur: N/A  Function - Comprehension Comprehension: Auditory Comprehension assist level: Follows complex conversation/direction with no assist  Function - Expression Expression: Verbal Expression assistive device: Communication board Expression assist level: Expresses complex ideas: With no assist  Function - Social Interaction Social Interaction assist level: Interacts appropriately with others - No medications needed.  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 Back brace when out  of bed. Patient with history of multiple lumbar surgeries as well as spinal cord stimulator January 2019   Continue PT, OT therapies.     -team conf today  -reassured that he's making gradual progress and that this will be an extended rehab effort, even beyond discharge from here 2. DVT left soleal, right  peroneal DVT's:.     prophylactic doses of Lovenox started on 4/12.    Repeat dopplers early next week 3. Pain Management:Neurontin 600 mg 3 times daily, Robaxin   as needed   Lidoderm patch started on 4/13   -scheduled tylenol  -oxycodone prn.  -local measures for left shoulder/better technique with transfers, etc.he's up on his feet and parallel bars which may have been putting more stress on his RTC. 4. Mood:Aricept 10 mg nightly, Namenda 10 mg twice daily, Effexor sore 150 mg twice daily, trazodone 100 mg nightly 5. Neuropsych: This patientisfully capable of making decisions on hisown behalf. 6. Skin/Wound Care:staples out per NS 7. Fluids/Electrolytes/Nutrition:Routine I/O's  -supplementing potassium KCl 20 mEq/day.  Potassium 3.9 4/30  - Appetite  improved   -I personally reviewed the patient's labs today.     8.ID/MSSA bacteremia.Intravenous Ancef 2 g every 8 hours through 05/22/2018 and stop as well as rifampin 300 mg twice daily.    -outpt Follow-up per infectious disease 9.Acute on chronic anemia.     Hemoglobin increased to 8.8 on 4/25 and 9.0 4/30    10.Hypertension. Norvasc 10 mg daily   Vitals:   04/17/18 1535 04/18/18 0251  BP: 121/84 (!) 141/73  Pulse: 92 93  Resp: 18 16  Temp: 98.6 F (37 C) 98.4 F (36.9 C)  SpO2: 98% 94%  reasonable control 4/30 11.Hyperlipidemia. Pravachol 12.History of prostate cancer. Proscar 5 mg daily 13.Constipation. Laxative assistance sorbitoll   -change miralax to scheduled. Continue senna-s at bedtime..   14. Urine retention, occasional incontinence seems to be improved  ---Ua negative, ucx negative  LOS (Days) 19 A FACE TO FACE EVALUATION WAS PERFORMED  [name] 04/18/2018, 8:41 AM

## 2018-04-19 ENCOUNTER — Inpatient Hospital Stay (HOSPITAL_COMMUNITY): Payer: Federal, State, Local not specified - PPO

## 2018-04-19 ENCOUNTER — Inpatient Hospital Stay (HOSPITAL_COMMUNITY): Payer: Federal, State, Local not specified - PPO | Admitting: Physical Therapy

## 2018-04-19 ENCOUNTER — Inpatient Hospital Stay (HOSPITAL_COMMUNITY): Payer: Federal, State, Local not specified - PPO | Admitting: Occupational Therapy

## 2018-04-19 MED ORDER — SORBITOL 70 % SOLN
60.0000 mL | Status: AC
  Administered 2018-04-19: 60 mL via ORAL
  Filled 2018-04-19: qty 60

## 2018-04-19 NOTE — Progress Notes (Signed)
Patient has home CPAP at bedside. 

## 2018-04-19 NOTE — Progress Notes (Signed)
Subjective/Complaints: Uneventful night. Still hasn't moved bowels. Pain under fair control  ROS: Patient denies fever, rash, sore throat, blurred vision, nausea, vomiting, diarrhea, cough, shortness of breath or chest pain, joint or back pain, headache, or mood change.     Objective: Vital Signs: Blood pressure 128/76, pulse 93, temperature 98.4 F (36.9 C), temperature source Oral, resp. rate 16, height '6\' 3"'  (1.905 m), weight 122.5 kg (270 lb 1 oz), SpO2 93 %. No results found. Results for orders placed or performed during the hospital encounter of 03/30/18 (from the past 72 hour(s))  Basic metabolic panel     Status: Abnormal   Collection Time: 04/18/18  5:35 AM  Result Value Ref Range   Sodium 138 135 - 145 mmol/L   Potassium 3.9 3.5 - 5.1 mmol/L   Chloride 100 (L) 101 - 111 mmol/L   CO2 28 22 - 32 mmol/L   Glucose, Bld 79 65 - 99 mg/dL   BUN 5 (L) 6 - 20 mg/dL   Creatinine, Ser 0.76 0.61 - 1.24 mg/dL   Calcium 9.0 8.9 - 10.3 mg/dL   GFR calc non Af Amer >60 >60 mL/min   GFR calc Af Amer >60 >60 mL/min    Comment: (NOTE) The eGFR has been calculated using the CKD EPI equation. This calculation has not been validated in all clinical situations. eGFR's persistently <60 mL/min signify possible Chronic Kidney Disease.    Anion gap 10 5 - 15    Comment: Performed at [facility] [address] [place], [place] [address]  CBC     Status: Abnormal   Collection Time: 04/18/18  5:35 AM  Result Value Ref Range   WBC 6.7 4.0 - 10.5 K/uL   RBC 3.62 (L) 4.22 - 5.81 MIL/uL   Hemoglobin 9.0 (L) 13.0 - 17.0 g/dL   HCT 31.1 (L) 39.0 - 52.0 %   MCV 85.9 78.0 - 100.0 fL   MCH 24.9 (L) 26.0 - 34.0 pg   MCHC 28.9 (L) 30.0 - 36.0 g/dL   RDW 17.0 (H) 11.5 - 15.5 %   Platelets 722 (H) 150 - 400 K/uL    Comment: Performed at [facility], [place] [address], [place], [place] [address]     Constitutional: No distress . Vital signs reviewed. HEENT: EOMI, oral membranes  moist Neck: supple Cardiovascular: RRR without murmur. No JVD    Respiratory: CTA Bilaterally without wheezes or rales. Normal effort    GI: BS +, non-tender, non-distended   Neuro: Alert and Oriented. Occasional STM deficits (day to day info) Sensation diminished to light touch below umbilicus Motor: 5/5 in BUE proximal to distal RLE: hip flexion 2+/5, knee extension 3+/5, ankle dorsiflexion 3+ tp 4/5 LLE: Hip flexion  1+ to 2/5, KE 3/5 ankle dorsiflexion 3 -/5--stab;e  Musc/Skel: left shoulder moving with mild discomfort Skin: back incision  intact       Assessment/Plan: 1. Functional deficits secondary to paraplegia secondary to epidural abscess which require 3+ hours per day of interdisciplinary therapy in a comprehensive inpatient rehab setting. Physiatrist is providing close team supervision and 24 hour management of active medical problems listed below. Physiatrist and rehab team continue to assess barriers to discharge/monitor patient progress toward functional and medical goals. FIM: Function - Bathing Bathing activity did not occur: Refused Position: Shower Body parts bathed by patient: Right arm, Left arm, Chest, Abdomen, Front perineal area, Right upper leg, Left upper leg Body parts bathed by helper: Buttocks, Left lower leg,  Right lower leg Bathing not applicable: Buttocks, Right upper leg, Left upper leg, Right lower leg, Left lower leg, Back Assist Level: Touching or steadying assistance(Pt > 75%) Set up : To obtain items  Function- Upper Body Dressing/Undressing Upper body dressing/undressing activity did not occur: Refused What is the patient wearing?: Pull over shirt/dress, Orthosis Pull over shirt/dress - Perfomed by patient: Put head through opening, Thread/unthread left sleeve, Thread/unthread right sleeve, Pull shirt over trunk Pull over shirt/dress - Perfomed by helper: Pull shirt over trunk Orthosis activity level: Performed by helper Assist Level:  Supervision or verbal cues Function - Lower Body Dressing/Undressing What is the patient wearing?: Underwear, Pants Position: Sitting EOB Underwear - Performed by patient: Thread/unthread right underwear leg, Thread/unthread left underwear leg Underwear - Performed by helper: Pull underwear up/down Pants- Performed by patient: Thread/unthread right pants leg, Thread/unthread left pants leg Pants- Performed by helper: Pull pants up/down Non-skid slipper socks- Performed by patient: Don/doff right sock, Don/doff left sock Non-skid slipper socks- Performed by helper: Don/doff right sock, Don/doff left sock Socks - Performed by patient: Don/doff right sock, Don/doff left sock Shoes - Performed by helper: Fasten right, Fasten left, Don/doff left shoe, Don/doff right shoe Assist for footwear: Dependant Assist for lower body dressing: Touching or steadying assistance (Pt > 75%)  Function - Toileting Toileting activity did not occur: No continent bowel/bladder event Toileting steps completed by patient: Adjust clothing after toileting Toileting steps completed by helper: Adjust clothing prior to toileting, Adjust clothing after toileting Assist level: Touching or steadying assistance (Pt.75%)  Function - Toilet Transfers Toilet transfer activity did not occur: N/A Toilet transfer assistive device: Mechanical lift, Elevated toilet seat/BSC over toilet Mechanical lift: Stedy Assist level to toilet: Dependent (Pt equals 0%) Assist level from toilet: Dependent (Pt equals 0%)  Function - Chair/bed transfer Chair/bed transfer activity did not occur: N/A Chair/bed transfer method: Lateral scoot Chair/bed transfer assist level: Supervision or verbal cues Chair/bed transfer assistive device: Mechanical lift Mechanical lift: Stedy Chair/bed transfer details: Verbal cues for technique, Verbal cues for precautions/safety  Function - Locomotion: Wheelchair Will patient use wheelchair at discharge?:  Yes Type: Manual Wheelchair activity did not occur: N/A Max wheelchair distance: 50 Assist Level: Supervision or verbal cues Wheel 50 feet with 2 turns activity did not occur: N/A Assist Level: Supervision or verbal cues Wheel 150 feet activity did not occur: N/A Assist Level: Supervision or verbal cues Turns around,maneuvers to table,bed, and toilet,negotiates 3% grade,maneuvers on rugs and over doorsills: No Function - Locomotion: Ambulation Ambulation activity did not occur: N/A Walk 10 feet activity did not occur: N/A Walk 50 feet with 2 turns activity did not occur: N/A Walk 150 feet activity did not occur: N/A Walk 10 feet on uneven surfaces activity did not occur: N/A  Function - Comprehension Comprehension: Auditory Comprehension assist level: Follows complex conversation/direction with no assist  Function - Expression Expression: Verbal Expression assistive device: Communication board Expression assist level: Expresses complex ideas: With no assist  Function - Social Interaction Social Interaction assist level: Interacts appropriately with others - No medications needed.  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 Back  brace when out of bed. Patient with history of multiple lumbar surgeries as well as spinal cord stimulator January 2019   Continue PT, OT therapies.     -continue to provide ego support re: progress/plan 2. DVT left soleal, right peroneal DVT's:.     prophylactic doses of Lovenox started on  4/12.    Repeat dopplers early next week 3. Pain Management:Neurontin 600 mg 3 times daily, Robaxin   as needed   Lidoderm patch started on 4/13   -scheduled tylenol  -oxycodone prn.  -local measures for left shoulder/better technique with transfers, etc.he's up on his feet and parallel bars which may have been putting more stress on his RTC. 4. Mood:Aricept 10 mg nightly, Namenda 10 mg twice daily, Effexor sore 150 mg twice daily, trazodone 100 mg nightly 5. Neuropsych: This patientisfully capable of making decisions on hisown behalf. 6. Skin/Wound Care:staples out per NS 7. Fluids/Electrolytes/Nutrition:Routine I/O's  -supplementing potassium KCl 20 mEq/day.  Potassium 3.9 4/30  - Appetite  improved         8.ID/MSSA bacteremia.Intravenous Ancef 2 g every 8 hours through 05/22/2018 and stop as well as rifampin 300 mg twice daily.    -outpt Follow-up per infectious disease 9.Acute on chronic anemia.     Hemoglobin  9.0 4/30    10.Hypertension. Norvasc 10 mg daily   Vitals:   04/18/18 1402 04/19/18 0205  BP: 109/60 128/76  Pulse: 97 93  Resp: 18 16  Temp:  98.4 F (36.9 C)  SpO2: 98% 93%  reasonable control 5/1 11.Hyperlipidemia. Pravachol 12.History of prostate cancer. Proscar 5 mg daily 13.Constipation. Laxative assistance sorbitoll   -changed miralax to scheduled. Continue senna-s at bedtime..    -sorbitol 60 today 14. Urine retention, occasional incontinence seems to be improved  ---Ua negative, ucx negative  LOS (Days) 20 A FACE TO FACE EVALUATION WAS PERFORMED  [name] 04/19/2018, 8:24 AM

## 2018-04-19 NOTE — Progress Notes (Signed)
Physical Therapy Session Note  Patient Details  Name: Cody Velez MRN: [id number] Date of Birth: 08/20/1946  Today's Date: 04/19/2018 PT Individual Time: 1100-1200 PT Individual Time Calculation (min): 60 min   Short Term Goals: Week 3:  PT Short Term Goal 1 (Week 3): Pt will demonstrate static standing with LRAD and mod assist for maintaining midline orientation (A/P direction).  PT Short Term Goal 2 (Week 3): Pt will propel w/c 100' and supervision for strengthening and cardiovascular endurance  PT Short Term Goal 3 (Week 3): Pt will tolerate static stance x1 minute for decreased burden of care with ADLs PT Short Term Goal 4 (Week 3): Pt will initiate pre-gait with PT.   Skilled Therapeutic Interventions/Progress Updates:    Pt supine in bed, agreeable to PT. Pt reports 7/10 pain in low back and that he has received pain medication recently. Supine to sit SBA for log-rolling with use of bedrails and HOB slightly elevated. Lateral scoot transfer bed to w/c with SBA, increased time to complete transfer and position BLE. Sit to stand x 2 reps to RW with SBA, mod A for standing balance once up. Use of mirror for visual feedback for upright posture. Pt is unable to fully straighten BLE in standing due to weakness and is unable to maintain upright posture. Sit stand in // bars with SBA, again pt exhibits increased weakness in BLE and is unable to maintain standing longer than 30 sec or fully extend BLE. Lateral scoot transfer w/c to/from mat table x 4 reps both directions and to slightly elevated surface with SBA and increased time to complete transfer. Seated BLE therex x 10 reps with orange theraband: marches, LAQ, HS curls, ankle pumps, hip abd/add. Lateral scoot transfer w/c to bed at end of session with SBA. Sit to supine mod A for BLE management. Pt left sidelying in bed with needs in reach, bed alarm in place.  Therapy Documentation Precautions:  Precautions Precautions: Fall,  Back Precaution Booklet Issued: No Precaution Comments: Pt needed min cues to recall 3/3 back precautions Required Braces or Orthoses: Spinal Brace Spinal Brace: Thoracolumbosacral orthotic, Applied in sitting position Restrictions Weight Bearing Restrictions: No  See Function Navigator for Current Functional Status.   Therapy/Group: Individual Therapy  Excell Seltzer, PT, DPT  04/19/2018, 12:11 PM

## 2018-04-19 NOTE — Patient Care Conference (Signed)
Inpatient RehabilitationTeam Conference and Plan of Care Update Date: 04/18/2018   Time: 2:00 PM    Patient Name: Cody Velez      Medical Record Number: [id number]  Date of Birth: 1946-02-13 Sex: Male         Room/Bed: 4W06C/4W06C-01 Payor Info: Payor: [place] / Plan: [employer] PPO / Product Type: *No Product type* /    Admitting Diagnosis: T 10  11 Myelopathy  Admit Date/Time:  03/30/2018  3:13 PM Admission Comments: No comment available   Primary Diagnosis:  Paraplegia ([place]) Principal Problem: Paraplegia [facility])  Patient Active Problem List   Diagnosis Date Noted  . Postoperative pain   . DVT, lower extremity, distal, acute, bilateral ([place])   . Acute blood loss anemia   . Anemia of chronic disease   . Essential hypertension   . Bacteremia   . Myelopathy ([place]) 03/30/2018  . Paraplegia ([place])   . Postlaminectomy syndrome, lumbar region   . Epidural abscess   . Abdominal distension   . Encephalopathy   . Weakness of both lower extremities   . Discitis of thoracic region   . Spinal cord stimulator status   . Staphylococcus aureus sepsis ([place]) 03/20/2018  . Bacteremia due to methicillin susceptible Staphylococcus aureus (MSSA) 03/18/2018  . Obesity 03/18/2018  . Weakness 03/16/2018  . Fever 03/16/2018  . Sepsis ([place]) 03/16/2018  . Nephrolithiasis 03/16/2018  . AKI (acute kidney injury) ([place]) 03/16/2018  . Acute metabolic encephalopathy 03/50/0938  . Cognitive impairment 03/16/2018  . Chronic pain syndrome 03/16/2018  . B12 deficiency 01/28/2016  . Memory difficulty 07/23/2015  . History of cerebrovascular disease 07/23/2015  . FH: colon cancer 08/13/2014  . Hx of adenomatous colonic polyps 08/13/2014  . Chronic back pain 05/30/2012  . CAD, NATIVE VESSEL 07/17/2010  . HYPERCHOLESTEROLEMIA 10/31/2009  . SMOKELESS TOBACCO ABUSE 10/31/2009  . Obstructive sleep apnea 10/31/2009  . Essential hypertension, benign 10/31/2009  . GERD 10/31/2009     Expected Discharge Date: Expected Discharge Date: 05/06/18  Team Members Present: Physician leading conference: Dr. Alger Simons Social Worker Present: Lennart Pall, LCSW Nurse Present: Isla Pence, RN PT Present: Canary Brim, PT OT Present: Roanna Epley, COTA PPS Coordinator present : Daiva Nakayama, RN, CRRN     Current Status/Progress Goal Weekly Team Focus  Medical   Pain better controlled.  Neurogenic bowel and issue.  Strength gradually improving.  Improved pain control  Manage pain, bowels, maximize lower extremity strength   Bowel/Bladder   continent of b/b occasional urinal spillage and urinary incontience at night LBM 04/16/18  continent of b/b with min assist  assess bowel and bladder q shift toilet as needed   Swallow/Nutrition/ Hydration             ADL's   athing-min A with AE; LB dsg-mod A with AE, sit<>stand with RW-mod A; lateral scoot transfers level surface-steady A  min A overall  bed mobility, functional transfers, LB bathing/dressing, activity tolerance   Mobility   bed mobility and lateral scoot transfers with supervision, min guard for sit<>stand with RW but strong anterior lean in standing requiring max assist for balance  will upgrade w/c transfers to supervision, remaining goals stay the same (mod I w/c, mod assist standing)  standing tolerance and balance, progressing towards gait as able, w/c mobility    Communication             Safety/Cognition/ Behavioral Observations  Pain   surgical back pain robaxin scheduled prn oxycodone tylenol   pain <= 3/10  Assess pain q shift and prn medicate as ordered notify MD for unrelieved pain   Skin   surgical incision to midline back with steri strips healed stage II to buttocks foam used prophylactically  ecchymosis BUE  surgical site remains free of infection no new areas of breakdown  assess q shift    Rehab Goals Patient on target to meet rehab goals: Yes *See Care Plan and progress notes for  long and short-term goals.     Barriers to Discharge  Current Status/Progress Possible Resolutions Date Resolved   Physician    Medical stability        Continued adaptive equipment training.  Wheelchair level goals for functional mobility      Nursing                  PT                    OT                  SLP                SW                Discharge Planning/Teaching Needs:  Pt to d/c home with girlfriend who can provide light physical assistance.  Daughter are available for intermittent support.  Teaching to be planned closer to d/c.   Team Discussion:  No significant change medically;  Cont of b/b;  Still with foam dsg on bottom.  Continues to make steady progress.  Sit to stands are improving. Continue to work on transfers and hope to move away from the [facility].  Doing some with min assist scoot.  Revisions to Treatment Plan:  None    Continued Need for Acute Rehabilitation Level of Care: The patient requires daily medical management by a physician with specialized training in physical medicine and rehabilitation for the following conditions: Daily direction of a multidisciplinary physical rehabilitation program to ensure safe treatment while eliciting the highest outcome that is of practical value to the patient.: Yes Daily medical management of patient stability for increased activity during participation in an intensive rehabilitation regime.: Yes Daily analysis of laboratory values and/or radiology reports with any subsequent need for medication adjustment of medical intervention for : Neurological problems;Post surgical problems  Cody Velez 04/19/2018, 2:19 PM

## 2018-04-19 NOTE — Progress Notes (Signed)
Occupational Therapy Session Note  Patient Details  Name: Cody Velez MRN: [id number] Date of Birth: May 09, 1946  Today's Date: 04/19/2018 OT Individual Time: 3009-2330 OT Individual Time Calculation (min): 75 min    Short Term Goals: Week 3:  OT Short Term Goal 1 (Week 3): Pt will transfer to [facility] with min A  OT Short Term Goal 2 (Week 3): Pt will complete LB bathing with min A using AE PRN OT Short Term Goal 3 (Week 3): Pt will pull pants up><down with mod A  OT Short Term Goal 4 (Week 3): Pt will tolerate standing for 1 minute for grooming at sink with min A   Skilled Therapeutic Interventions/Progress Updates:    Pt resting in bed upon arrival.  Pt requested to completed bathing/dressing tasks at bed level and seated EOB for energy conservation and pain management.  Pt stated his pain escalated when performing tasks in w/c.  Pt required assistance donning pants at bed level.  Pt was able to bathe buttocks at bed level.  Pt able to perform supine>sit EOB at supervision level this morning.  Pt completed grooming tasks seated in w/c.  Pt transitioned to gym and perfomred scoot transfer to mat with supervision.  Pt completed sit<>stand X 5 from varying heights-supervison level.  Pt required max verbal cues to stand upright and place BLE in safe position.  Pt with difficulty placing BLE in correct place if not looking at them.  Pt returned to room and performed scoot transfer to bed.  Pt required assistance placing BLE onto bed but is able to repositon in bed without assistance.  Pt remained in bed with all needs within reach and bed alarm activated.   Therapy Documentation Precautions:  Precautions Precautions: Fall, Back Precaution Booklet Issued: No Precaution Comments: Pt needed min cues to recall 3/3 back precautions Required Braces or Orthoses: Spinal Brace Spinal Brace: Thoracolumbosacral orthotic, Applied in sitting position Restrictions Weight Bearing Restrictions: No Pain: Pt  c/o 4/10 pain in back; activity and repositioned See Function Navigator for Current Functional Status.   Therapy/Group: Individual Therapy  [name] 04/19/2018, 12:52 PM

## 2018-04-19 NOTE — Progress Notes (Signed)
Patient has home CPAP. No assistance needed from RT at this time.

## 2018-04-19 NOTE — Progress Notes (Signed)
Pt LBM 4-27. Naaman Plummer, MD aware. Sorbitol 34ml given one time dose this morning 1000 with no result. Will continue to monitor.

## 2018-04-19 NOTE — Progress Notes (Signed)
Physical Therapy Session Note  Patient Details  Name: Cody Velez MRN: [id number] Date of Birth: [birth year]  Today's Date: 04/19/2018 PT Individual Time: 1515-1640 PT Individual Time Calculation (min): 85 min   Short Term Goals: Week 2:  PT Short Term Goal 1 (Week 2): Pt will demonstrate sit<>stand with RW and max assist +1 PT Short Term Goal 1 - Progress (Week 2): Met PT Short Term Goal 2 (Week 2): pt will initiate pre-gait training with PT PT Short Term Goal 2 - Progress (Week 2): Met PT Short Term Goal 3 (Week 2): Pt will propel manual w/c x50' with supervision PT Short Term Goal 3 - Progress (Week 2): Met Week 3:  PT Short Term Goal 1 (Week 3): Pt will demonstrate static standing with LRAD and mod assist for maintaining midline orientation (A/P direction).  PT Short Term Goal 2 (Week 3): Pt will propel w/c 100' and supervision for strengthening and cardiovascular endurance  PT Short Term Goal 3 (Week 3): Pt will tolerate static stance x1 minute for decreased burden of care with ADLs PT Short Term Goal 4 (Week 3): Pt will initiate pre-gait with PT.   Skilled Therapeutic Interventions/Progress Updates:    c/o 5/10 pain but reports pre-medicated.  Session focus on NMR, activity tolerance, and pre-gait.    Pt requires supervision for supine>sit and PT donned TLSO and shoes total assist for time management.  Lateral scoot x3 throughout session with close supervision to min assist.  Nustep x10 minutes at level 6 for reciprocal stepping pattern retraining, activity tolerance, forced use, and LE strengthening.    Sit<>stand x5 in // bars progressing to pre-gait (1 step each LE forward/back progress to 2 steps forward/back).  On fourth stand pt ambulated 4' in // bars with max assist for safety as pt's knees not fully extended.  On fifth stand (from standard chair) pt began taking steps back to w/c, on final step LLE stepping too far, and pt sat on PT's lap, no parts of pt touching the floor  except his feet.  Required extensive multi-person assist to reposition LEs and power up into w/c.  RN present and aware.  Pt returned to room and transferred back to bed with stedy.  Positioned to comfort with call bell in reach and needs met.   Therapy Documentation Precautions:  Precautions Precautions: Fall, Back Precaution Booklet Issued: No Precaution Comments: Pt needed min cues to recall 3/3 back precautions Required Braces or Orthoses: Spinal Brace Spinal Brace: Thoracolumbosacral orthotic, Applied in sitting position Restrictions Weight Bearing Restrictions: No   See Function Navigator for Current Functional Status.   Therapy/Group: Individual Therapy  Michel Santee 04/19/2018, 4:54 PM

## 2018-04-19 NOTE — Progress Notes (Signed)
Occupational Therapy Session Note  Patient Details  Name: Cody Velez MRN: [id number] Date of Birth: Mar 19, 1946  Today's Date: 04/19/2018 OT Individual Time: 1330-1400 OT Individual Time Calculation (min): 30 min    Skilled Therapeutic Interventions/Progress Updates:    1:1 Focus on sit to stands and standing tolerance and balance with bilateral UE support. Pt focus requires min A to get into standing position but can required mod to max A to maintain standing. Pt continues to present with ataxic feet that move unexpectedly in standing; sometimes resulting in uncontrolled descents. Also focus on controlled descents. Pt transferred back into bed squat pivot with  Min guard.   Therapy Documentation Precautions:  Precautions Precautions: Fall, Back Precaution Booklet Issued: No Precaution Comments: Pt needed min cues to recall 3/3 back precautions Required Braces or Orthoses: Spinal Brace Spinal Brace: Thoracolumbosacral orthotic, Applied in sitting position Restrictions Weight Bearing Restrictions: No Pain: Pain Assessment Pain Score: 5  Pain Type: Acute pain Pain Location: Back Pain Descriptors / Indicators: Aching Pain Intervention(s): Medication (See eMAR) ADL: ADL ADL Comments: Please see functional navigator  See Function Navigator for Current Functional Status.   Therapy/Group: Individual Therapy  Willeen Cass [facility] 04/19/2018, 3:37 PM

## 2018-04-20 ENCOUNTER — Inpatient Hospital Stay (HOSPITAL_COMMUNITY): Payer: Federal, State, Local not specified - PPO

## 2018-04-20 ENCOUNTER — Inpatient Hospital Stay (HOSPITAL_COMMUNITY): Payer: Federal, State, Local not specified - PPO | Admitting: Physical Therapy

## 2018-04-20 ENCOUNTER — Inpatient Hospital Stay (HOSPITAL_COMMUNITY): Payer: Medicare Other | Admitting: Occupational Therapy

## 2018-04-20 MED ORDER — LINACLOTIDE 145 MCG PO CAPS
145.0000 ug | ORAL_CAPSULE | Freq: Every day | ORAL | Status: DC
  Administered 2018-04-20 – 2018-05-02 (×13): 145 ug via ORAL
  Filled 2018-04-20 (×13): qty 1

## 2018-04-20 NOTE — Progress Notes (Signed)
Occupational Therapy Session Note  Patient Details  Name: Cody Velez MRN: [id number] Date of Birth: 13-Nov-1946  Today's Date: 04/20/2018 OT Individual Time: 0800-0900 OT Individual Time Calculation (min): 60 min    Short Term Goals: Week 4:  OT Short Term Goal 1 (Week 4): Pt will pull pants up><down with mod A  OT Short Term Goal 2 (Week 4): Pt will tolerate standing for 1 minute for grooming at sink with min A  OT Short Term Goal 3 (Week 4): Pt will transfer to [facility] with min A   Skilled Therapeutic Interventions/Progress Updates:    OT intervention with initial focus on bathing/dressing with sit<>stand from EOB.  Pt sitting on BSC upon arrival and transferred back to EOB with Stedy. Pt required assistance with bathing buttocks while standing with RW.  Pt attempted to pull up pants while standing but required max A to complete task. Pt propelled w/c with BLE (forwards and backwards) to day room for continued sit<>stand from w/c with min A and min A for standing balance (longest time of 25 seconds). Pt returned to room and transferred to bed with lateral scoot.  Increased clearance noted during transfer.  Pt remained in bed with all needs within reach.   Therapy Documentation Precautions:  Precautions Precautions: Fall, Back Precaution Booklet Issued: No Precaution Comments: Pt needed min cues to recall 3/3 back precautions Required Braces or Orthoses: Spinal Brace Spinal Brace: Thoracolumbosacral orthotic, Applied in sitting position Restrictions Weight Bearing Restrictions: No Pain:  Pt reports initial back pain of 4/10 which increased to 8/10 with activity; RN aware and repositioned   Other Treatments:    See Function Navigator for Current Functional Status.   Therapy/Group: Individual Therapy  [name] 04/20/2018, 9:04 AM

## 2018-04-20 NOTE — Progress Notes (Signed)
Nutrition Follow-up  DOCUMENTATION CODES:   Obesity unspecified  INTERVENTION:  Continue Ensure Enlive po BID, each supplement provides 350 kcal and 20 grams of protein.  Encourage adequate PO intake.   NUTRITION DIAGNOSIS:   Inadequate oral intake related to poor appetite as evidenced by per patient/family report; improved   GOAL:   Patient will meet greater than or equal to 90% of their needs; met  MONITOR:   PO intake, Supplement acceptance, Labs, Weight trends, I & O's, Skin  REASON FOR ASSESSMENT:   Consult Poor PO  ASSESSMENT:   72 y.o. right-handed male with history of hypertension, memory deficits maintained on Aricept and Namenda, prostate cancer, PTSD, chronic back pain with multiple lumbar surgeries with recent spinal cord stimulator placement January 2019 as well as treatment for recent UTI.  Presented 03/16/2018 with lethargy/fall as well as hypoxemia with paraparesis.  X-rays and imaging revealed osteomyelitis T10-T11 with epidural abscess. Patient underwent laminectomy T10-T11 decompression of epidural abscess debridement of discitis T10-T11 with segmental fixation from T8-L3 03/28/2018.  Meal completion has been 100%. Appetite good. Pt currently has Ensure ordered with varied consumption. RD to continue with current orders to aid in adequate protein needs.   Diet Order:   Diet Order           Diet regular Room service appropriate? Yes; Fluid consistency: Thin  Diet effective now          EDUCATION NEEDS:   No education needs have been identified at this time  Skin:  Skin Assessment: Skin Integrity Issues: Skin Integrity Issues:: Stage II Stage II: coccyx  Last BM:  4/27  Height:   Ht Readings from Last 1 Encounters:  04/15/18 '6\' 3"'  (1.905 m)    Weight:   Wt Readings from Last 1 Encounters:  04/20/18 276 lb 3.8 oz (125.3 kg)    Ideal Body Weight:  89 kg  BMI:  Body mass index is 34.53 kg/m.  Estimated Nutritional Needs:   Kcal:   2150-2350  Protein:  105-120 grams  Fluid:  >/= 2.1 L/day    Corrin Parker, MS, RD, LDN Pager # [phone] After hours/ weekend pager # [phone]

## 2018-04-20 NOTE — Progress Notes (Signed)
Physical Therapy Session Note  Patient Details  Name: Cody Velez MRN: [id number] Date of Birth: [birth year]  Today's Date: 04/20/2018 PT Individual Time: 1400-1515 PT Individual Time Calculation (min): 75 min   Short Term Goals: Week 3:  PT Short Term Goal 1 (Week 3): Pt will demonstrate static standing with LRAD and mod assist for maintaining midline orientation (A/P direction).  PT Short Term Goal 2 (Week 3): Pt will propel w/c 100' and supervision for strengthening and cardiovascular endurance  PT Short Term Goal 3 (Week 3): Pt will tolerate static stance x1 minute for decreased burden of care with ADLs PT Short Term Goal 4 (Week 3): Pt will initiate pre-gait with PT.   Skilled Therapeutic Interventions/Progress Updates:    no c/o pain at rest but increased pain with movement. Session focus on transfers, d/c planning, LE stretching and w/c positioning and propulsion.  Pt able to transition to EOB with increased time and supervision.  TLSO and shoes donned total assist. Supervision for lateral scoot to w/c on L.  PT provided verbal cues for pt to keep checking his foot placement for increased independence and safety due to occasional ataxic movement and decreased proprioception in LEs.  Pt taken outside for therapeutic change of scenery.  Discussed discharge plans and best case/worst case scenarios as far as level of function and DME/assist needs at that time.  PT also provided PROM stretching to heel cords, hamstrings, and hip ERs for improved ability to stand with upright posture and for lower body dressing (figure 4 position).  On return to unit, PT switched out pt's chair for standard w/c 18x20, which pt was able to transition to with lateral scoot from mat with supervision, and propel back to room from therapy gym with supervision.  Pt requesting to sit up for a few minutes, and pt's partner, [name], okay to assist pt back to bed per safety plan.      Therapy Documentation Precautions:   Precautions Precautions: Fall, Back Precaution Booklet Issued: No Precaution Comments: Pt needed min cues to recall 3/3 back precautions Required Braces or Orthoses: Spinal Brace Spinal Brace: Thoracolumbosacral orthotic, Applied in sitting position Restrictions Weight Bearing Restrictions: No   See Function Navigator for Current Functional Status.   Therapy/Group: Individual Therapy  Michel Santee 04/20/2018, 3:24 PM

## 2018-04-20 NOTE — Progress Notes (Signed)
Occupational Therapy Session Note  Patient Details  Name: Cody Velez MRN: [id number] Date of Birth: 1946-07-20  Today's Date: 04/20/2018 OT Individual Time: 8676-7209 OT Individual Time Calculation (min): 68 min     Skilled Therapeutic Interventions/Progress Updates:    Upon entering the room, pt supine in bed with significant other present. Pt with 8/10 c/o pain in back and RN notified. OT providing paper handout regarding energy conservation and educating pt and caregiver on techniques for self care and home management. Pt actively listening and asking appropriate questions. Pt performed supine >sit with log roll technique and close supervision to EOB. OT assisted pt with donning TLSO in seated position. Focus on stand<> controlled sit with use of RW. Pt continues to have very uncontrolled ataxic feet but looking at feet while standing helps him per pt report. Pt standing x 5 reps with mod lifting assistance for 30 seconds each. RN arrived with pain medications during session. Pt scooting up towards EOB with steady assistance for safety and returns to supine at end of session for pain relief.  Therapy Documentation Precautions:  Precautions Precautions: Fall, Back Precaution Booklet Issued: No Precaution Comments: Pt needed min cues to recall 3/3 back precautions Required Braces or Orthoses: Spinal Brace Spinal Brace: Thoracolumbosacral orthotic, Applied in sitting position Restrictions Weight Bearing Restrictions: No   Pain: Pain Assessment Pain Score: 4  Faces Pain Scale: No hurt PAINAD (Pain Assessment in Advanced Dementia) Breathing: normal Negative Vocalization: none Facial Expression: smiling or inexpressive Body Language: relaxed Consolability: no need to console PAINAD Score: 0 ADL: ADL ADL Comments: Please see functional navigator  See Function Navigator for Current Functional Status.   Therapy/Group: Individual Therapy  Gypsy Decant 04/20/2018, 12:42  PM

## 2018-04-20 NOTE — Progress Notes (Signed)
Social Work Patient ID: Cody Velez, male   DOB: February 09, 1946, 72 y.o.   MRN: [id number]   Have reviewed team conference with pt who is aware we continue to plan toward 5/18 d/c and min min assist goals.  Remains very motivated and denies any concerns at this time.  Loretta Doutt, LCSW

## 2018-04-20 NOTE — Progress Notes (Signed)
Occupational Therapy Weekly Progress Note  Patient Details  Name: Cody Velez MRN: [id number] Date of Birth: [birth year]  Beginning of progress report period: April 14, 2018 End of progress report period: Apr 20, 2018  Patient has met 1 of 4 short term goals. Pt is making steady progress with bed mobility, sit<>stand, bathing at shower and bed level, and dressing with sit<>stand from w/c. Pt performs lateral scoot transfers to level surfaces at supervision level but currently requires mod A for [facility] transfers. Pt requires max A for pulling up pants while standing with RW.  Pt is able to stand for approx 25 seconds using RUE for functional tasks.  Pt continues to report constant pain rated between 4-8 but attempts to persevere for therapy.    Patient continues to demonstrate the following deficits: muscle weakness, decreased cardiorespiratoy endurance, impaired timing and sequencing, unbalanced muscle activation, ataxia, decreased coordination and decreased motor planning and decreased standing balance, decreased postural control and decreased balance strategies and therefore will continue to benefit from skilled OT intervention to enhance overall performance with BADL and Reduce care partner burden.  Patient progressing toward long term goals..  Continue plan of care.  OT Short Term Goals Week 3:  OT Short Term Goal 1 (Week 3): Pt will transfer to [facility] with min A  OT Short Term Goal 1 - Progress (Week 3): Progressing toward goal OT Short Term Goal 2 (Week 3): Pt will complete LB bathing with min A using AE PRN OT Short Term Goal 2 - Progress (Week 3): Met OT Short Term Goal 3 (Week 3): Pt will pull pants up><down with mod A  OT Short Term Goal 3 - Progress (Week 3): Progressing toward goal OT Short Term Goal 4 (Week 3): Pt will tolerate standing for 1 minute for grooming at sink with min A  OT Short Term Goal 4 - Progress (Week 3): Progressing toward goal Week 4:  OT Short Term Goal 1 (Week  4): Pt will pull pants up><down with mod A  OT Short Term Goal 2 (Week 4): Pt will tolerate standing for 1 minute for grooming at sink with min A  OT Short Term Goal 3 (Week 4): Pt will transfer to [facility] with min A        Therapy Documentation Precautions:  Precautions Precautions: Fall, Back Precaution Booklet Issued: No Precaution Comments: Pt needed min cues to recall 3/3 back precautions Required Braces or Orthoses: Spinal Brace Spinal Brace: Thoracolumbosacral orthotic, Applied in sitting position Restrictions Weight Bearing Restrictions: No  See Function Navigator for Current Functional Status.   Leotis Shames [facility] 04/20/2018, 8:01 AM

## 2018-04-20 NOTE — Progress Notes (Signed)
Patient has home CPAP @ bedside.

## 2018-04-20 NOTE — Progress Notes (Signed)
Subjective/Complaints: Still hasn't moved bowels. Pain controlled. Up with OT when I arrived  ROS: Patient denies fever, rash, sore throat, blurred vision, nausea, vomiting, diarrhea, cough, shortness of breath or chest pain, joint or back pain, headache, or mood change.   Objective: Vital Signs: Blood pressure 127/87, pulse 89, temperature 98.3 F (36.8 C), temperature source Oral, resp. rate 17, height 6' 3" (1.905 m), weight 125.3 kg (276 lb 3.8 oz), SpO2 93 %. No results found. Results for orders placed or performed during the hospital encounter of 03/30/18 (from the past 72 hour(s))  Basic metabolic panel     Status: Abnormal   Collection Time: 04/18/18  5:35 AM  Result Value Ref Range   Sodium 138 135 - 145 mmol/L   Potassium 3.9 3.5 - 5.1 mmol/L   Chloride 100 (L) 101 - 111 mmol/L   CO2 28 22 - 32 mmol/L   Glucose, Bld 79 65 - 99 mg/dL   BUN 5 (L) 6 - 20 mg/dL   Creatinine, Ser 0.76 0.61 - 1.24 mg/dL   Calcium 9.0 8.9 - 10.3 mg/dL   GFR calc non Af Amer >60 >60 mL/min   GFR calc Af Amer >60 >60 mL/min    Comment: (NOTE) The eGFR has been calculated using the CKD EPI equation. This calculation has not been validated in all clinical situations. eGFR's persistently <60 mL/min signify possible Chronic Kidney Disease.    Anion gap 10 5 - 15    Comment: Performed at [facility] [address], [place], [place] [address]  CBC     Status: Abnormal   Collection Time: 04/18/18  5:35 AM  Result Value Ref Range   WBC 6.7 4.0 - 10.5 K/uL   RBC 3.62 (L) 4.22 - 5.81 MIL/uL   Hemoglobin 9.0 (L) 13.0 - 17.0 g/dL   HCT 31.1 (L) 39.0 - 52.0 %   MCV 85.9 78.0 - 100.0 fL   MCH 24.9 (L) 26.0 - 34.0 pg   MCHC 28.9 (L) 30.0 - 36.0 g/dL   RDW 17.0 (H) 11.5 - 15.5 %   Platelets 722 (H) 150 - 400 K/uL    Comment: Performed at [facility], [place] [address], [place], [place] [address]     Constitutional: No distress . Vital signs reviewed. Morbidly obese HEENT: EOMI, oral  membranes moist Neck: supple Cardiovascular: RRR without murmur. No JVD    Respiratory: CTA Bilaterally without wheezes or rales. Normal effort    GI: BS +, non-tender, non-distended  Neuro: Alert and Oriented. Occasional STM deficits (day to day info) Sensation diminished to light touch below umbilicus Motor: 5/5 in BUE proximal to distal RLE: hip flexion 2/5, knee extension 3+/5, ankle dorsiflexion/plantarflexion 4/5 LLE: Hip flexion   2-/5, KE 3/5 ankle dorsiflexion/PF 3/5  Musc/Skel: left shoulder moving with mild discomfort Skin: back incision remains intact       Assessment/Plan: 1. Functional deficits secondary to paraplegia secondary to epidural abscess which require 3+ hours per day of interdisciplinary therapy in a comprehensive inpatient rehab setting. Physiatrist is providing close team supervision and 24 hour management of active medical problems listed below. Physiatrist and rehab team continue to assess barriers to discharge/monitor patient progress toward functional and medical goals. FIM: Function - Bathing Bathing activity did not occur: Refused Position: Sitting EOB(bed level for LB) Body parts bathed by patient: Right arm, Left arm, Chest, Abdomen, Buttocks, Right upper leg, Left upper leg, Front perineal area Body parts bathed by helper: Right lower  leg, Left lower leg, Back Bathing not applicable: Buttocks, Right upper leg, Left upper leg, Right lower leg, Left lower leg, Back Assist Level: Touching or steadying assistance(Pt > 75%) Set up : To obtain items  Function- Upper Body Dressing/Undressing Upper body dressing/undressing activity did not occur: Refused What is the patient wearing?: Pull over shirt/dress, Orthosis Pull over shirt/dress - Perfomed by patient: Put head through opening, Thread/unthread left sleeve, Thread/unthread right sleeve, Pull shirt over trunk Pull over shirt/dress - Perfomed by helper: Pull shirt over trunk Orthosis activity level:  Performed by helper Assist Level: Supervision or verbal cues Function - Lower Body Dressing/Undressing What is the patient wearing?: Underwear, Pants, Socks, Shoes Position: Bed Underwear - Performed by patient: Pull underwear up/down Underwear - Performed by helper: Thread/unthread left underwear leg, Thread/unthread right underwear leg Pants- Performed by patient: Pull pants up/down Pants- Performed by helper: Thread/unthread left pants leg, Thread/unthread right pants leg Non-skid slipper socks- Performed by patient: Don/doff right sock, Don/doff left sock Non-skid slipper socks- Performed by helper: Don/doff right sock, Don/doff left sock Socks - Performed by patient: Don/doff right sock, Don/doff left sock Socks - Performed by helper: Don/doff right sock, Don/doff left sock Shoes - Performed by helper: Don/doff right shoe, Don/doff left shoe, Fasten right, Fasten left Assist for footwear: Dependant Assist for lower body dressing: Touching or steadying assistance (Pt > 75%)  Function - Toileting Toileting activity did not occur: No continent bowel/bladder event Toileting steps completed by patient: Adjust clothing after toileting Toileting steps completed by helper: Adjust clothing prior to toileting, Adjust clothing after toileting Assist level: Touching or steadying assistance (Pt.75%)  Function - Toilet Transfers Toilet transfer activity did not occur: N/A Toilet transfer assistive device: Mechanical lift, Elevated toilet seat/BSC over toilet Mechanical lift: Stedy Assist level to toilet: Dependent (Pt equals 0%) Assist level from toilet: Dependent (Pt equals 0%)  Function - Chair/bed transfer Chair/bed transfer activity did not occur: N/A Chair/bed transfer method: Lateral scoot, Other Chair/bed transfer assist level: Supervision or verbal cues Chair/bed transfer assistive device: Armrests, Mechanical lift Mechanical lift: Stedy Chair/bed transfer details: Verbal cues for  technique, Verbal cues for precautions/safety  Function - Locomotion: Wheelchair Will patient use wheelchair at discharge?: Yes Type: Manual Wheelchair activity did not occur: N/A Max wheelchair distance: 50 Assist Level: Supervision or verbal cues Wheel 50 feet with 2 turns activity did not occur: N/A Assist Level: Supervision or verbal cues Wheel 150 feet activity did not occur: N/A Assist Level: Supervision or verbal cues Turns around,maneuvers to table,bed, and toilet,negotiates 3% grade,maneuvers on rugs and over doorsills: No Function - Locomotion: Ambulation Ambulation activity did not occur: N/A Assistive device: Parallel bars Max distance: 4' Assist level: Maximal assist (Pt 25 - 49%) Walk 10 feet activity did not occur: N/A Walk 50 feet with 2 turns activity did not occur: N/A Walk 150 feet activity did not occur: N/A Walk 10 feet on uneven surfaces activity did not occur: N/A  Function - Comprehension Comprehension: Auditory Comprehension assist level: Follows complex conversation/direction with no assist  Function - Expression Expression: Verbal Expression assistive device: Communication board Expression assist level: Expresses complex ideas: With no assist  Function - Social Interaction Social Interaction assist level: Interacts appropriately with others - No medications needed.  Function - Problem Solving Problem solving assist level: Solves complex problems: Recognizes & self-corrects  Function - Memory Memory assist level: More than reasonable amount of time Patient normally able to recall (first 3 days only): Current season, Staff names   and faces, That he or she is in a hospital, Location of own room  Medical Problem List and Plan: 1.Decreased functional mobilitysecondary to T10-11 osteomyelitis/epidural abscess with subsequent myelopathy/paraplegia, status post decompression and fusion T8-L3. Back brace when out of bed. Patient with history of  multiple lumbar surgeries as well as spinal cord stimulator January 2019   Continue PT, OT therapies.      2. DVT left soleal, right peroneal DVT's:.     prophylactic doses of Lovenox started on 4/12.    Repeat dopplers on Monday 3. Pain Management:Neurontin 600 mg 3 times daily, Robaxin   as needed   Lidoderm patch started on 4/13   -scheduled tylenol  -oxycodone prn.  -local measures for left shoulder/better technique with transfers, etc.he's up on his feet and parallel bars which may have been putting more stress on his RTC. 4. Mood:Aricept 10 mg nightly, Namenda 10 mg twice daily, Effexor sore 150 mg twice daily, trazodone 100 mg nightly 5. Neuropsych: This patientisfully capable of making decisions on hisown behalf. 6. Skin/Wound Care:staples out per NS 7. Fluids/Electrolytes/Nutrition:Routine I/O's  -supplementing potassium KCl 20 mEq/day.  Potassium 3.9 4/30  - Appetite  improved         8.ID/MSSA bacteremia.Intravenous Ancef 2 g every 8 hours through 05/22/2018 and stop as well as rifampin 300 mg twice daily.    -outpt Follow-up per infectious disease 9.Acute on chronic anemia.     Hemoglobin  9.0 4/30    10.Hypertension. Norvasc 10 mg daily   Vitals:   04/19/18 1331 04/20/18 0515  BP: 139/89 127/87  Pulse: 95 89  Resp: 18 17  Temp: (!) 97.5 F (36.4 C) 98.3 F (36.8 C)  SpO2: 97% 93%  reasonable control 5/1 11.Hyperlipidemia. Pravachol 12.History of prostate cancer. Proscar 5 mg daily 13.Constipation. Laxative assistance sorbitoll   -changed miralax to scheduled. Continue senna-s at bedtime..    -sorbitol 60 without results  -linzess trial  -prn sse/supp 14. Urine retention, occasional incontinence seems to be improved  ---Ua negative, ucx negative  LOS (Days) 21 A FACE TO FACE EVALUATION WAS PERFORMED  [name] 04/20/2018, 9:47 AM

## 2018-04-21 ENCOUNTER — Inpatient Hospital Stay (HOSPITAL_COMMUNITY): Payer: Federal, State, Local not specified - PPO

## 2018-04-21 ENCOUNTER — Inpatient Hospital Stay (HOSPITAL_COMMUNITY): Payer: Federal, State, Local not specified - PPO | Admitting: Physical Therapy

## 2018-04-21 ENCOUNTER — Inpatient Hospital Stay (HOSPITAL_COMMUNITY): Payer: Federal, State, Local not specified - PPO | Admitting: Occupational Therapy

## 2018-04-21 MED ORDER — POLYETHYLENE GLYCOL 3350 17 G PO PACK
17.0000 g | PACK | Freq: Every day | ORAL | Status: DC | PRN
  Administered 2018-04-30: 17 g via ORAL
  Filled 2018-04-21: qty 1

## 2018-04-21 NOTE — Plan of Care (Signed)
  Problem: Consults Goal: RH SPINAL CORD INJURY PATIENT EDUCATION Description Pt able to verbalize and initiate education regarding spinal cord injury with MOD I assist   Outcome: Progressing   Problem: SCI BOWEL ELIMINATION Goal: RH STG MANAGE BOWEL WITH ASSISTANCE Description STG Manage Bowel with min Assistance.  Outcome: Progressing Goal: RH STG SCI MANAGE BOWEL WITH MEDICATION WITH ASSISTANCE Description STG SCI Manage bowel with medication with Min assistance.  Outcome: Progressing   Problem: SCI BLADDER ELIMINATION Goal: RH STG MANAGE BLADDER WITH ASSISTANCE Description STG Manage Bladder With min Assistance  Outcome: Progressing   Problem: RH SKIN INTEGRITY Goal: RH STG SKIN FREE OF INFECTION/BREAKDOWN Description Skin will remain free from breakdown with min assist  Outcome: Progressing Goal: RH STG MAINTAIN SKIN INTEGRITY WITH ASSISTANCE Description STG Maintain Skin Integrity With min Assistance.  Outcome: Progressing   Problem: RH SAFETY Goal: RH STG ADHERE TO SAFETY PRECAUTIONS W/ASSISTANCE/DEVICE Description STG Adhere to Safety Precautions With min  Assistance/Device.  Outcome: Progressing   Problem: RH PAIN MANAGEMENT Goal: RH STG PAIN MANAGED AT OR BELOW PT'S PAIN GOAL Description Pain goal is less than 4 with min assist of PRN medications   Outcome: Progressing

## 2018-04-21 NOTE — Progress Notes (Signed)
Pt has home CPAP and stated he did not need help with CPAP for the night.

## 2018-04-21 NOTE — Progress Notes (Signed)
Occupational Therapy Session Note  Patient Details  Name: [name] MRN: [id number] Date of Birth: [birth year]  Today's Date: 04/21/2018 OT Individual Time: 2119-4174 OT Individual Time Calculation (min): 35 min   Skilled Therapeutic Interventions/Progress Updates:    1:1 Pt working on getting dressed when arrived. Pt with recall of AE for LB dressing and able to complete doff and donning socks and threading pants with setup. Pt with sit to stand at sink but unable to come into full stance with max A to maintain position. Pt required total A for clothing management.   In gym focus on sit to stands and maintaining standing position while supported in [place] with walking sling with knee plate to prevent total buckling of knees. Pt able to take ~8 steps forward and then 4 backwards with extra time and cues for placement. Pt with decr properception and sensation in LEs preventing feedback. LEft up with w/c with family member.   Therapy Documentation Precautions:  Precautions Precautions: Fall, Back Precaution Booklet Issued: No Precaution Comments: Pt needed min cues to recall 3/3 back precautions Required Braces or Orthoses: Spinal Brace Spinal Brace: Thoracolumbosacral orthotic, Applied in sitting position Restrictions Weight Bearing Restrictions: No Pain: No pain reported in session ADL: ADL ADL Comments: Please see functional navigator  See Function Navigator for Current Functional Status.   Therapy/Group: Individual Therapy  Willeen Cass [facility] 04/21/2018, 10:59 AM

## 2018-04-21 NOTE — Progress Notes (Signed)
Subjective/Complaints: Had bm last night. Feels better. Ready for therapies this am  ROS: Patient denies fever, rash, sore throat, blurred vision, nausea, vomiting, diarrhea, cough, shortness of breath or chest pain, joint or back pain, headache, or mood change.    Objective: Vital Signs: Blood pressure 140/75, pulse 90, temperature 98.4 F (36.9 C), temperature source Oral, resp. rate 15, height 6\' 3"  (1.905 m), weight 124.6 kg (274 lb 11.1 oz), SpO2 95 %. No results found. No results found for this or any previous visit (from the past 72 hour(s)).   Constitutional: No distress . Vital signs reviewed. HEENT: EOMI, oral membranes moist Neck: supple Cardiovascular: RRR without murmur. No JVD    Respiratory: CTA Bilaterally without wheezes or rales. Normal effort    GI: BS +, non-tender, non-distended  Neuro: Alert and Oriented. Occasional STM deficits (day to day info) Sensation diminished to light touch below umbilicus Motor: 5/5 in BUE proximal to distal RLE: hip flexion 2/5, knee extension 3+/5, ankle dorsiflexion/plantarflexion 4/5 LLE: Hip flexion   2-/5, KE 3/5 ankle dorsiflexion/PF 3/5  Musc/Skel: left shoulder with minimal discomfort Skin: back incision remains intact       Assessment/Plan: 1. Functional deficits secondary to paraplegia secondary to epidural abscess which require 3+ hours per day of interdisciplinary therapy in a comprehensive inpatient rehab setting. Physiatrist is providing close team supervision and 24 hour management of active medical problems listed below. Physiatrist and rehab team continue to assess barriers to discharge/monitor patient progress toward functional and medical goals. FIM: Function - Bathing Bathing activity did not occur: Refused Position: Sitting EOB(bed level for LB) Body parts bathed by patient: Right arm, Left arm, Chest, Abdomen, Buttocks, Right upper leg, Left upper leg, Front perineal area Body parts bathed by helper: Right  lower leg, Left lower leg, Back Bathing not applicable: Buttocks, Right upper leg, Left upper leg, Right lower leg, Left lower leg, Back Assist Level: Touching or steadying assistance(Pt > 75%) Set up : To obtain items  Function- Upper Body Dressing/Undressing Upper body dressing/undressing activity did not occur: Refused What is the patient wearing?: Pull over shirt/dress, Orthosis Pull over shirt/dress - Perfomed by patient: Put head through opening, Thread/unthread left sleeve, Thread/unthread right sleeve, Pull shirt over trunk Pull over shirt/dress - Perfomed by helper: Pull shirt over trunk Orthosis activity level: Performed by helper Assist Level: Supervision or verbal cues Function - Lower Body Dressing/Undressing What is the patient wearing?: Underwear, Pants, Socks, Shoes Position: Bed Underwear - Performed by patient: Pull underwear up/down Underwear - Performed by helper: Thread/unthread left underwear leg, Thread/unthread right underwear leg Pants- Performed by patient: Pull pants up/down Pants- Performed by helper: Thread/unthread left pants leg, Thread/unthread right pants leg Non-skid slipper socks- Performed by patient: Don/doff right sock, Don/doff left sock Non-skid slipper socks- Performed by helper: Don/doff right sock, Don/doff left sock Socks - Performed by patient: Don/doff right sock, Don/doff left sock Socks - Performed by helper: Don/doff right sock, Don/doff left sock Shoes - Performed by helper: Don/doff right shoe, Don/doff left shoe, Fasten right, Fasten left Assist for footwear: Dependant Assist for lower body dressing: Touching or steadying assistance (Pt > 75%)  Function - Toileting Toileting activity did not occur: No continent bowel/bladder event Toileting steps completed by patient: Adjust clothing after toileting Toileting steps completed by helper: Adjust clothing prior to toileting, Adjust clothing after toileting Assist level: Touching or  steadying assistance (Pt.75%)  Function - Air cabin crew transfer activity did not occur: N/A Toilet transfer assistive device:  Mechanical lift, Elevated toilet seat/BSC over toilet Mechanical lift: Stedy Assist level to toilet: Maximal assist (Pt 25 - 49%/lift and lower) Assist level from toilet: Maximal assist (Pt 25 - 49%/lift and lower)  Function - Chair/bed transfer Chair/bed transfer activity did not occur: N/A Chair/bed transfer method: Lateral scoot Chair/bed transfer assist level: Supervision or verbal cues Chair/bed transfer assistive device: Armrests Mechanical lift: Stedy Chair/bed transfer details: Verbal cues for technique, Verbal cues for precautions/safety  Function - Locomotion: Wheelchair Will patient use wheelchair at discharge?: Yes Type: Manual Wheelchair activity did not occur: N/A Max wheelchair distance: 150 Assist Level: Supervision or verbal cues Wheel 50 feet with 2 turns activity did not occur: N/A Assist Level: Supervision or verbal cues Wheel 150 feet activity did not occur: N/A Assist Level: Supervision or verbal cues Turns around,maneuvers to table,bed, and toilet,negotiates 3% grade,maneuvers on rugs and over doorsills: No Function - Locomotion: Ambulation Ambulation activity did not occur: N/A Assistive device: Parallel bars Max distance: 4' Assist level: Maximal assist (Pt 25 - 49%) Walk 10 feet activity did not occur: N/A Walk 50 feet with 2 turns activity did not occur: N/A Walk 150 feet activity did not occur: N/A Walk 10 feet on uneven surfaces activity did not occur: N/A  Function - Comprehension Comprehension: Auditory Comprehension assist level: Follows complex conversation/direction with no assist  Function - Expression Expression: Verbal Expression assistive device: Communication board Expression assist level: Expresses complex ideas: With no assist  Function - Social Interaction Social Interaction assist level:  Interacts appropriately with others - No medications needed.  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 Back brace when out of bed. Patient with history of multiple lumbar surgeries as well as spinal cord stimulator January 2019   Continue PT, OT therapies.     -continue to provide ego support 2. DVT left soleal, right peroneal DVT's:.     prophylactic doses of Lovenox started on 4/12.    Repeat dopplers on Monday 3. Pain Management:Neurontin 600 mg 3 times daily, Robaxin   as needed   Lidoderm patch started on 4/13   -scheduled tylenol  -oxycodone prn.  -ROM/improved technique to reduce stress on left shoulder 4. Mood:Aricept 10 mg nightly, Namenda 10 mg twice daily, Effexor sore 150 mg twice daily, trazodone 100 mg nightly 5. Neuropsych: This patientisfully capable of making decisions on hisown behalf. 6. Skin/Wound Care:staples out per NS 7. Fluids/Electrolytes/Nutrition:Routine I/O's  -supplementing potassium KCl 20 mEq/day.  Potassium 3.9 4/30  - Appetite  improved         8.ID/MSSA bacteremia.Intravenous Ancef 2 g every 8 hours through 05/22/2018 and stop as well as rifampin 300 mg twice daily.    -outpt Follow-up per infectious disease 9.Acute on chronic anemia.     Hemoglobin  9.0 4/30--follow up next week    10.Hypertension. Norvasc 10 mg daily   Vitals:   04/20/18 1559 04/21/18 0105  BP: 132/84 140/75  Pulse: 88 90  Resp:  15  Temp: 98.1 F (36.7 C) 98.4 F (36.9 C)   SpO2: 94% 95%  reasonable control 5/3 11.Hyperlipidemia. Pravachol 12.History  of prostate cancer. Proscar 5 mg daily 13.Constipation.     -results with linzess  -dc soft/lax 14. Urine retention, occasional incontinence seems to be improved  ---Ua negative, ucx negative  LOS (Days) 22 A FACE TO FACE EVALUATION WAS PERFORMED  [name] 04/21/2018, 8:24 AM

## 2018-04-21 NOTE — Progress Notes (Signed)
Occupational Therapy Session Note  Patient Details  Name: Cody Velez MRN: [id number] Date of Birth: [birth year]  Today's Date: 04/21/2018 OT Individual Time: 1000-1100 OT Individual Time Calculation (min): 60 min    Short Term Goals: Week 1:  OT Short Term Goal 1 (Week 1): Pt will tolerate sitting EOB for 5 mins in preparation for BADL tasks. OT Short Term Goal 2 (Week 1): Pt will direct care to don TLSO with min A from caregiver OT Short Term Goal 3 (Week 1): Pt will maintain sitting balance at EOB with no more than mod A in preparation for BADL tasks OT Short Term Goal 4 (Week 1): Pt will recall 3/3 back precautions without cues  Skilled Therapeutic Interventions/Progress Updates:    1;1. Pt with unrated pain in back and RN already administered pain medication. Pt want to focus on sit to stand and stepping in prep for functional transfers. Pt propels w/c to/from all destinations with supervision. Pt completes 5 sit to stand with facilitation at knee to keep from buckling and min-mod A for transition with RW. Pt completes forward tapping to target with MIN-MOD A for balance with BLE for 5 reps per leg. Pt sit to stand at kitchen sink crossing midline and reaching into cupboard for glasses with VC/tactile cues fron knees hitting cabinets to straighten B Knees. Exited session with pt sidleying in bed with all needs in reach.  Therapy Documentation Precautions:  Precautions Precautions: Fall, Back Precaution Booklet Issued: No Precaution Comments: Pt needed min cues to recall 3/3 back precautions Required Braces or Orthoses: Spinal Brace Spinal Brace: Thoracolumbosacral orthotic, Applied in sitting position Restrictions Weight Bearing Restrictions: No  See Function Navigator for Current Functional Status.   Therapy/Group: Individual Therapy  Tonny Branch 04/21/2018, 10:57 AM

## 2018-04-21 NOTE — Progress Notes (Signed)
Occupational Therapy Session Note  Patient Details  Name: Cody Velez MRN: [id number] Date of Birth: [birth year]  Today's Date: 04/21/2018 OT Individual Time: 5621-3086 OT Individual Time Calculation (min): 56 min    Skilled Therapeutic Interventions/Progress Updates:    1:1. Pt requires increased time to arouse after nap. Pt supine>sitting with min A to come up to EOB. Pt requires up to MOD A for sitting balance while donning TLSO with A to fasten lowest strap. Pt reporting feeling like his head is "turning" vitals assessed and in flow chart WNL but slightly elevated. RN aware. Pt eats lunch quickly seated EOB with supervision Pt completes lateral scoot transfer with min A and VC for hand placement. Pt requires min A to assume seated figure 4 without LE lifter. Pt can assume figure 4 with LE lifter in prep for functional use during ADLs. OT installs shoe buttons in shoes and pt able to fasten in figure 4. Exited session with pt seated in w/c with call lihgt in reach and all needs met.  Therapy Documentation Precautions:  Precautions Precautions: Fall, Back Precaution Booklet Issued: No Precaution Comments: Pt needed min cues to recall 3/3 back precautions Required Braces or Orthoses: Spinal Brace Spinal Brace: Thoracolumbosacral orthotic, Applied in sitting position Restrictions Weight Bearing Restrictions: No  See Function Navigator for Current Functional Status.   Therapy/Group: Individual Therapy  Tonny Branch 04/21/2018, 1:59 PM

## 2018-04-21 NOTE — Progress Notes (Signed)
Physical Therapy Session Note  Patient Details  Name: Cody Velez MRN: [id number] Date of Birth: 26-Aug-1946  Today's Date: 04/21/2018 PT Individual Time: 1400-1515 PT Individual Time Calculation (min): 75 min   Short Term Goals: Week 3:  PT Short Term Goal 1 (Week 3): Pt will demonstrate static standing with LRAD and mod assist for maintaining midline orientation (A/P direction).  PT Short Term Goal 2 (Week 3): Pt will propel w/c 100' and supervision for strengthening and cardiovascular endurance  PT Short Term Goal 3 (Week 3): Pt will tolerate static stance x1 minute for decreased burden of care with ADLs PT Short Term Goal 4 (Week 3): Pt will initiate pre-gait with PT.   Skilled Therapeutic Interventions/Progress Updates:    Pt with no c/o pain at rest, 5/10 with activity.  Session focus on LE NMR, activity tolerance, and w/c mobility.    Pt requires close supervision for lateral scoot transfers with cues for maintaining visual of feet, as he reports they have been "more crazy" today.  PT also provided education for w/c set up for transfers which pt returned demonstration of, but will need practice to ensure carry over.  Further discussed d/c and plan for w/c evaluation in the next week or so.  W/C propulsion throughout unit up to 150' mod I.  PT instructed pt in LE exercises for NMR with weights for increased feedback of positioning for 2x10 reps hip flexion, and LAQ with focus on controlled descent.  Kinetron x3+3 minutes at 15 cm/s focus on reciprocal stepping pattern retraining, neutral LE rotation, and activity tolerance. Pt returned to bed at end of session with lateral scoot as above. PT removed TLSO and shoes.  Pt requires min assist for LLE into bed and able to maintain log roll position.  Call bell in reach and needs met.   Therapy Documentation Precautions:  Precautions Precautions: Fall, Back Precaution Booklet Issued: No Precaution Comments: Pt needed min cues to recall 3/3  back precautions Required Braces or Orthoses: Spinal Brace Spinal Brace: Thoracolumbosacral orthotic, Applied in sitting position Restrictions Weight Bearing Restrictions: No   See Function Navigator for Current Functional Status.   Therapy/Group: Individual Therapy  Michel Santee 04/21/2018, 4:45 PM

## 2018-04-22 ENCOUNTER — Inpatient Hospital Stay (HOSPITAL_COMMUNITY): Payer: Medicare Other | Admitting: Occupational Therapy

## 2018-04-22 DIAGNOSIS — E876 Hypokalemia: Secondary | ICD-10-CM

## 2018-04-22 DIAGNOSIS — G8918 Other acute postprocedural pain: Secondary | ICD-10-CM

## 2018-04-22 NOTE — Progress Notes (Signed)
Subjective/Complaints: Patient seen lying in bed this morning. States he slept well overnight. He wants to be able to walk again.  ROS: Denies CP, SOB, N/V/D  Objective: Vital Signs: Blood pressure 133/86, pulse 93, temperature 98.4 F (36.9 C), temperature source Oral, resp. rate 17, height 6\' 3"  (1.905 m), weight 123.5 kg (272 lb 4.3 oz), SpO2 96 %. No results found. No results found for this or any previous visit (from the past 72 hour(s)).   Constitutional: No distress . Vital signs reviewed. HENT: Normocephalic.  Atraumatic. Eyes: EOMI. No discharge. Cardiovascular: RRR. No JVD. Respiratory: CTA Bilaterally. Normal effort. GI: BS +. Non-distended. Musc: No edema or tenderness in extremities. Neuro: Alert and Oriented.  Motor: 5/5 in BUE proximal to distal RLE: hip flexion 3+/5, knee extension 3+/5, ankle dorsiflexion/plantarflexion 4/5 LLE: Hip flexion   3-/5, KE 3/5 ankle dorsiflexion/PF 3+/5  Musc/Skel: left shoulder with minimal discomfort Skin: Warm and dry.       Assessment/Plan: 1. Functional deficits secondary to paraplegia secondary to epidural abscess which require 3+ hours per day of interdisciplinary therapy in a comprehensive inpatient rehab setting. Physiatrist is providing close team supervision and 24 hour management of active medical problems listed below. Physiatrist and rehab team continue to assess barriers to discharge/monitor patient progress toward functional and medical goals. FIM: Function - Bathing Bathing activity did not occur: Refused Position: Sitting EOB(bed level for LB) Body parts bathed by patient: Right arm, Left arm, Chest, Abdomen, Buttocks, Right upper leg, Left upper leg, Front perineal area Body parts bathed by helper: Right lower leg, Left lower leg, Back Bathing not applicable: Buttocks, Right upper leg, Left upper leg, Right lower leg, Left lower leg, Back Assist Level: Touching or steadying assistance(Pt > 75%) Set up : To  obtain items  Function- Upper Body Dressing/Undressing Upper body dressing/undressing activity did not occur: Refused What is the patient wearing?: Pull over shirt/dress, Orthosis Pull over shirt/dress - Perfomed by patient: Put head through opening, Thread/unthread left sleeve, Thread/unthread right sleeve, Pull shirt over trunk Pull over shirt/dress - Perfomed by helper: Pull shirt over trunk Orthosis activity level: Performed by helper Assist Level: Supervision or verbal cues Function - Lower Body Dressing/Undressing What is the patient wearing?: Underwear, Pants, Socks, Shoes Position: Bed Underwear - Performed by patient: Pull underwear up/down Underwear - Performed by helper: Thread/unthread left underwear leg, Thread/unthread right underwear leg Pants- Performed by patient: Pull pants up/down Pants- Performed by helper: Thread/unthread left pants leg, Thread/unthread right pants leg Non-skid slipper socks- Performed by patient: Don/doff right sock, Don/doff left sock Non-skid slipper socks- Performed by helper: Don/doff right sock, Don/doff left sock Socks - Performed by patient: Don/doff right sock, Don/doff left sock Socks - Performed by helper: Don/doff right sock, Don/doff left sock Shoes - Performed by helper: Don/doff right shoe, Don/doff left shoe, Fasten right, Fasten left Assist for footwear: Dependant Assist for lower body dressing: Touching or steadying assistance (Pt > 75%)  Function - Toileting Toileting activity did not occur: No continent bowel/bladder event Toileting steps completed by patient: Adjust clothing after toileting Toileting steps completed by helper: Adjust clothing prior to toileting, Adjust clothing after toileting Assist level: Touching or steadying assistance (Pt.75%)  Function - Air cabin crew transfer activity did not occur: N/A Toilet transfer assistive device: Mechanical lift, Elevated toilet seat/BSC over toilet Mechanical lift:  Stedy Assist level to toilet: Maximal assist (Pt 25 - 49%/lift and lower) Assist level from toilet: Maximal assist (Pt 25 - 49%/lift and lower)  Function - Chair/bed transfer Chair/bed transfer activity did not occur: N/A Chair/bed transfer method: Lateral scoot Chair/bed transfer assist level: Supervision or verbal cues Chair/bed transfer assistive device: Armrests Mechanical lift: Stedy Chair/bed transfer details: Verbal cues for technique, Verbal cues for precautions/safety  Function - Locomotion: Wheelchair Will patient use wheelchair at discharge?: Yes Type: Manual Wheelchair activity did not occur: N/A Max wheelchair distance: 150 Assist Level: No help, No cues, assistive device, takes more than reasonable amount of time Wheel 50 feet with 2 turns activity did not occur: N/A Assist Level: No help, No cues, assistive device, takes more than reasonable amount of time Wheel 150 feet activity did not occur: N/A Assist Level: No help, No cues, assistive device, takes more than reasonable amount of time Turns around,maneuvers to table,bed, and toilet,negotiates 3% grade,maneuvers on rugs and over doorsills: No Function - Locomotion: Ambulation Ambulation activity did not occur: N/A Assistive device: Parallel bars Max distance: 4' Assist level: Maximal assist (Pt 25 - 49%) Walk 10 feet activity did not occur: N/A Walk 50 feet with 2 turns activity did not occur: N/A Walk 150 feet activity did not occur: N/A Walk 10 feet on uneven surfaces activity did not occur: N/A  Function - Comprehension Comprehension: Auditory Comprehension assist level: Follows complex conversation/direction with no assist  Function - Expression Expression: Verbal Expression assistive device: Communication board Expression assist level: Expresses complex ideas: With no assist  Function - Social Interaction Social Interaction assist level: Interacts appropriately with others with medication or extra  time (anti-anxiety, antidepressant).(effexor, aricept, namenda)  Function - Problem Solving Problem solving assist level: Solves complex 90% of the time/cues < 10% of the time  Function - Memory Memory assist level: More than reasonable amount of time Patient normally able to recall (first 3 days only): Current season, Staff names and faces, That he or she is in a hospital, Location of own room  Medical Problem List and Plan: 1.Decreased functional mobilitysecondary to T10-11 osteomyelitis/epidural abscess with subsequent myelopathy/paraplegia, status post decompression and fusion T8-L3. Back brace when out of bed. Patient with history of multiple lumbar surgeries as well as spinal cord stimulator January 2019   Continue CIR   Notes reviewed, labs reviewed 2. DVT left soleal, right peroneal DVT's:.     prophylactic doses of Lovenox started on 4/12.    Repeat dopplers on monday 3. Pain Management:Neurontin 600 mg 3 times daily, Robaxin   as needed   Lidoderm patch started on 4/13   -scheduled tylenol  -oxycodone prn.  -ROM/improved technique to reduce stress on left shoulder 4. Mood:Aricept 10 mg nightly, Namenda 10 mg twice daily, Effexor 150 mg twice daily, trazodone 100 mg nightly 5. Neuropsych: This patientisfully capable of making decisions on hisown behalf. 6. Skin/Wound Care:staples out per NS 7. Fluids/Electrolytes/Nutrition:Routine I/O's  -supplementing potassium KCl 20 mEq/day.    Potassium 3.9 on 4/30  Appetite  improved      8.ID/MSSA bacteremia.Intravenous Ancef 2 g every 8 hours through 05/22/2018 and stop as well as rifampin 300 mg twice daily.    -outpt Follow-up per infectious disease 9.Acute on chronic anemia.     Hemoglobin  9.0 on 4/30  Follow up next week    10.Hypertension. Norvasc 10 mg daily   Vitals:   04/21/18 0105 04/21/18 1517  BP: 140/75 133/86  Pulse: 90 93  Resp: 15 17  Temp: 98.4 F (36.9 C) 98.4 F (36.9 C)  SpO2: 95%  96%    Relatively controlled on 5/4 11.Hyperlipidemia. Pravachol  12.History of prostate cancer. Proscar 5 mg daily 13.Constipation.     -results with linzess  -dced soft/lax 14. Urine retention, occasional incontinence seems to be improved  ---Ua negative, ucx negative 15. Morbid obesity   Body mass index is 34.03 kg/m.  Diet and exercise education  Encourage weight loss to increase endurance and promote overall health   LOS (Days) 23 A FACE TO FACE EVALUATION WAS PERFORMED  Oleg Oleson Lorie Phenix 04/22/2018, 10:39 AM

## 2018-04-22 NOTE — Progress Notes (Signed)
Occupational Therapy Session Note  Patient Details  Name: Cody Velez MRN: [id number] Date of Birth: 06/13/1946  Today's Date: 04/22/2018 OT Individual Time: 5456-2563 OT Individual Time Calculation (min): 40 min   Skilled Therapeutic Interventions/Progress Updates:    Pt greeted supine in bed. Requesting to get dressed. Tx focus on AE training, sit<stands, functional transfers, activity tolerance and precaution adherence during dressing and grooming tasks. Pt completed LB dressing EOB with min vcs to fasten laces over shoe buttons with use of reacher. Several LOBs to Rt side with pt able to self correct. Once TLSO was donned, sit<stand in [place] completed with supervision to elevate pants over hips (due to RW not present in room). Scoot transfer<w/c completed with steady assist. For remainder of session he completed oral care/grooming tasks w/c level. At end of session pt was left with all needs within reach.   Therapy Documentation Precautions:  Precautions Precautions: Fall, Back Precaution Booklet Issued: No Precaution Comments: Pt needed min cues to recall 3/3 back precautions Required Braces or Orthoses: Spinal Brace Spinal Brace: Thoracolumbosacral orthotic, Applied in sitting position Restrictions Weight Bearing Restrictions: No Pain: Pain Assessment Pain Scale: 0-10 Pain Score: 0-No pain Pain Location: Back Pain Orientation: Mid;Lower Pain Descriptors / Indicators: Aching Pain Frequency: Intermittent Pain Onset: On-going Pain Intervention(s): Medication (See eMAR) ADL: ADL ADL Comments: Please see functional navigator     See Function Navigator for Current Functional Status.   Therapy/Group: Individual Therapy  Luceil Herrin A Andri Prestia 04/22/2018, 12:46 PM

## 2018-04-22 NOTE — Plan of Care (Signed)
  Problem: Consults Goal: RH SPINAL CORD INJURY PATIENT EDUCATION Description Pt able to verbalize and initiate education regarding spinal cord injury with MOD I assist   Outcome: Progressing Goal: Skin Care Protocol Initiated - if Braden Score 18 or less Description Pt will have improved skin breakdown while in rehab with min assist   Outcome: Progressing   Problem: SCI BOWEL ELIMINATION Goal: RH STG MANAGE BOWEL WITH ASSISTANCE Description STG Manage Bowel with min Assistance.  Outcome: Progressing Goal: RH STG SCI MANAGE BOWEL WITH MEDICATION WITH ASSISTANCE Description STG SCI Manage bowel with medication with Min assistance.  Outcome: Progressing   Problem: SCI BLADDER ELIMINATION Goal: RH STG MANAGE BLADDER WITH ASSISTANCE Description STG Manage Bladder With min Assistance  Outcome: Progressing   Problem: RH SKIN INTEGRITY Goal: RH STG SKIN FREE OF INFECTION/BREAKDOWN Description Skin will remain free from breakdown with min assist  Outcome: Progressing Goal: RH STG MAINTAIN SKIN INTEGRITY WITH ASSISTANCE Description STG Maintain Skin Integrity With min Assistance.  Outcome: Progressing   Problem: RH SAFETY Goal: RH STG ADHERE TO SAFETY PRECAUTIONS W/ASSISTANCE/DEVICE Description STG Adhere to Safety Precautions With min  Assistance/Device.  Outcome: Progressing   Problem: RH PAIN MANAGEMENT Goal: RH STG PAIN MANAGED AT OR BELOW PT'S PAIN GOAL Description Pain goal is less than 4 with min assist of PRN medications   Outcome: Progressing

## 2018-04-22 NOTE — Procedures (Signed)
Patient has his home CPAP beside and ready for use. Patient stated he was not going to wear tonight.

## 2018-04-23 ENCOUNTER — Inpatient Hospital Stay (HOSPITAL_COMMUNITY): Payer: Medicare Other

## 2018-04-23 ENCOUNTER — Inpatient Hospital Stay (HOSPITAL_COMMUNITY): Payer: Medicare Other | Admitting: Occupational Therapy

## 2018-04-23 DIAGNOSIS — G479 Sleep disorder, unspecified: Secondary | ICD-10-CM

## 2018-04-23 MED ORDER — NON FORMULARY: 1.5000 mg | Freq: Every day | Status: DC

## 2018-04-23 MED ORDER — MELATONIN 3 MG PO TABS
1.5000 mg | ORAL_TABLET | Freq: Every day | ORAL | Status: DC
  Administered 2018-04-23 – 2018-05-05 (×12): 1.5 mg via ORAL
  Filled 2018-04-23 (×15): qty 0.5

## 2018-04-23 NOTE — Progress Notes (Signed)
Physical Therapy Session Note  Patient Details  Name: Cody Velez MRN: [id number] Date of Birth: June 17, 1946  Today's Date: 04/23/2018 PT Individual Time: 1430-1530 PT Individual Time Calculation (min): 60 min   Short Term Goals: Week 3:  PT Short Term Goal 1 (Week 3): Pt will demonstrate static standing with LRAD and mod assist for maintaining midline orientation (A/P direction).  PT Short Term Goal 2 (Week 3): Pt will propel w/c 100' and supervision for strengthening and cardiovascular endurance  PT Short Term Goal 3 (Week 3): Pt will tolerate static stance x1 minute for decreased burden of care with ADLs PT Short Term Goal 4 (Week 3): Pt will initiate pre-gait with PT.   Skilled Therapeutic Interventions/Progress Updates:   Functional bed mobility to come to EOB with supervision and assist to don TLSO and shoes. Performed min assist scoot transfer into w/c (required increased assist due to shorts getting stuck on edge of w/c) with assist for blocking of feet. In parallel bars with maxi sky focused on NMR to address sit <> stands, standing tolerance and postural control in standing, and gait training (2' forward and backwards and on second trial 5' forward and then backwards). Pt required mod assist to get into standing with cues for hand placement and technique. Pt able to advance BLE independently with unweighting of Maxi sky but demonstrates significant knee flexion and poor ability to sustain hip and trunk extension for any considerable period of time. Pt requires extended rest break between trials due to fatigue. W/c mobility for functional UE strengthening and endurance to/from therapy sessions and pt able to manage bilateral legrests to with set up assist. Pt requires min assist for scoot transfer back to bed. Pt may benefit from use of slideboard for transfers due to decreased consistent clearance with level transfer especially as fatigued. Returned to supine with min assist for LLE  management.   Therapy Documentation Precautions:  Precautions Precautions: Fall, Back Precaution Booklet Issued: No Precaution Comments: Pt needed min cues to recall 3/3 back precautions Required Braces or Orthoses: Spinal Brace Spinal Brace: Thoracolumbosacral orthotic, Applied in sitting position Restrictions Weight Bearing Restrictions: No Pain: Pain Assessment Pain Scale: 0-10 Pain Score: 7  Pain Type: Acute pain Pain Location: Back Pain Orientation: Mid;Lower Pain Descriptors / Indicators: Aching Pain Frequency: Occasional Pain Onset: On-going Pain Intervention(s): Medication (See eMAR)   See Function Navigator for Current Functional Status.   Therapy/Group: Individual Therapy  Canary Brim Ivory Broad, PT, DPT  04/23/2018, 3:46 PM

## 2018-04-23 NOTE — Progress Notes (Signed)
[facility] has home CPAP unit at bedside. Patient has placed self on and will take off when ready.

## 2018-04-23 NOTE — Progress Notes (Signed)
Occupational Therapy Session Note  Patient Details  Name: Cody Velez MRN: [id number] Date of Birth: 08/11/1946  Today's Date: 04/23/2018 OT Individual Time: 0755-0900 OT Individual Time Calculation (min): 65 min   Skilled Therapeutic Interventions/Progress Updates:    Pt greeted supine in bed. Requesting to shower. Tx focus on functional transfers, balance, AE training, and activity tolerance during bathing and dressing at shower level. After OT donned TLSO in seated position, all functional transfers completed with supervision using Stedy lift. Pt bathed while sitting on BSC, utilizing lateral leans for perihygiene and LH sponge for LEs and back. He required assist for thoroughness post BM, however pt encouraged that he was able to assist while adhering to his precautions. Due to pt having continent BM throughout session, dressing also completed on BSC. Dependent for footwear due to feet sticking to sock aide. Successful with reacher for threading LEs into pants. Pt was also able to assist with elevating pants over hips while standing in [place]! At end of session pt returned to w/c. He was left seated with TLSO and all needs within reach.     Therapy Documentation Precautions:  Precautions Precautions: Fall, Back Precaution Booklet Issued: No Precaution Comments: Pt needed min cues to recall 3/3 back precautions Required Braces or Orthoses: Spinal Brace Spinal Brace: Thoracolumbosacral orthotic, Applied in sitting position Restrictions Weight Bearing Restrictions: No   Pain: RN in to provide pain medicine at start of session  Pain Assessment Pain Scale: 0-10 Pain Score: 7  Pain Location: Back Pain Orientation: Mid;Lower Pain Descriptors / Indicators: Aching Pain Frequency: Occasional Pain Onset: On-going Pain Intervention(s): Medication (See eMAR) ADL: ADL ADL Comments: Please see functional navigator     See Function Navigator for Current Functional  Status.   Therapy/Group: Individual Therapy  Aziza Stuckert A Tearia Gibbs 04/23/2018, 10:13 AM

## 2018-04-23 NOTE — Progress Notes (Signed)
Subjective/Complaints: Patient seen lying in bed this morning. He states he slept fairly overnight. He requests an increase in his sleep aid.  ROS: Denies CP, SOB, N/V/D  Objective: Vital Signs: Blood pressure (!) 149/87, pulse 82, temperature 98.5 F (36.9 C), temperature source Oral, resp. rate 18, height 6\' 3"  (1.905 m), weight 124.5 kg (274 lb 7.6 oz), SpO2 96 %. No results found. No results found for this or any previous visit (from the past 72 hour(s)).   Constitutional: No distress . Vital signs reviewed. HENT: Normocephalic.  Atraumatic. Eyes: EOMI. No discharge. Cardiovascular: RRR. No JVD. Respiratory: CTA Bilaterally. Normal effort. GI: BS +. Non-distended. Musc: No edema or tenderness in extremities. Neuro: Alert and Oriented.  Motor: 5/5 in BUE proximal to distal RLE: hip flexion 3+/5, knee extension 3+/5, ankle dorsiflexion/plantarflexion 4/5 (stable) LLE: Hip flexion   3-/5, KE 3/5 ankle dorsiflexion/PF 3+/5 (stable) Skin: Warm and dry.     Assessment/Plan: 1. Functional deficits secondary to paraplegia secondary to epidural abscess which require 3+ hours per day of interdisciplinary therapy in a comprehensive inpatient rehab setting. Physiatrist is providing close team supervision and 24 hour management of active medical problems listed below. Physiatrist and rehab team continue to assess barriers to discharge/monitor patient progress toward functional and medical goals. FIM: Function - Bathing Bathing activity did not occur: Refused Position: Sitting EOB(bed level for LB) Body parts bathed by patient: Right arm, Left arm, Chest, Abdomen, Buttocks, Right upper leg, Left upper leg, Front perineal area Body parts bathed by helper: Right lower leg, Left lower leg, Back Bathing not applicable: Buttocks, Right upper leg, Left upper leg, Right lower leg, Left lower leg, Back Assist Level: Touching or steadying assistance(Pt > 75%) Set up : To obtain items  Function-  Upper Body Dressing/Undressing Upper body dressing/undressing activity did not occur: Refused What is the patient wearing?: Pull over shirt/dress, Orthosis Pull over shirt/dress - Perfomed by patient: Put head through opening, Thread/unthread left sleeve, Thread/unthread right sleeve, Pull shirt over trunk Pull over shirt/dress - Perfomed by helper: Pull shirt over trunk Orthosis activity level: Performed by helper Assist Level: Supervision or verbal cues Function - Lower Body Dressing/Undressing What is the patient wearing?: Pants, Socks, Shoes Position: Sitting EOB Underwear - Performed by patient: Pull underwear up/down Underwear - Performed by helper: Thread/unthread left underwear leg, Thread/unthread right underwear leg Pants- Performed by patient: Thread/unthread right pants leg, Thread/unthread left pants leg Pants- Performed by helper: Pull pants up/down Non-skid slipper socks- Performed by patient: Don/doff right sock, Don/doff left sock Non-skid slipper socks- Performed by helper: Don/doff right sock, Don/doff left sock Socks - Performed by patient: Don/doff right sock, Don/doff left sock Socks - Performed by helper: Don/doff right sock, Don/doff left sock Shoes - Performed by patient: Fasten right, Fasten left Shoes - Performed by helper: Don/doff right shoe, Don/doff left shoe Assist for footwear: Partial/moderate assist Assist for lower body dressing: Assistive device(sit<stand in [place]) Assistive Device Comment: Reacher + sock aide  Function - Toileting Toileting activity did not occur: No continent bowel/bladder event Toileting steps completed by patient: Adjust clothing after toileting Toileting steps completed by helper: Adjust clothing prior to toileting, Adjust clothing after toileting Assist level: Touching or steadying assistance (Pt.75%)  Function - Air cabin crew transfer activity did not occur: N/A Toilet transfer assistive device: Mechanical lift,  Elevated toilet seat/BSC over toilet Mechanical lift: Stedy Assist level to toilet: Maximal assist (Pt 25 - 49%/lift and lower) Assist level from toilet: Maximal assist (Pt 25 -  49%/lift and lower)  Function - Chair/bed transfer Chair/bed transfer activity did not occur: N/A Chair/bed transfer method: Lateral scoot Chair/bed transfer assist level: Supervision or verbal cues Chair/bed transfer assistive device: Armrests Mechanical lift: Stedy Chair/bed transfer details: Verbal cues for technique, Verbal cues for precautions/safety  Function - Locomotion: Wheelchair Will patient use wheelchair at discharge?: Yes Type: Manual Wheelchair activity did not occur: N/A Max wheelchair distance: 150 Assist Level: No help, No cues, assistive device, takes more than reasonable amount of time Wheel 50 feet with 2 turns activity did not occur: N/A Assist Level: No help, No cues, assistive device, takes more than reasonable amount of time Wheel 150 feet activity did not occur: N/A Assist Level: No help, No cues, assistive device, takes more than reasonable amount of time Turns around,maneuvers to table,bed, and toilet,negotiates 3% grade,maneuvers on rugs and over doorsills: No Function - Locomotion: Ambulation Ambulation activity did not occur: N/A Assistive device: Parallel bars Max distance: 4' Assist level: Maximal assist (Pt 25 - 49%) Walk 10 feet activity did not occur: N/A Walk 50 feet with 2 turns activity did not occur: N/A Walk 150 feet activity did not occur: N/A Walk 10 feet on uneven surfaces activity did not occur: N/A  Function - Comprehension Comprehension: Auditory Comprehension assist level: Follows complex conversation/direction with no assist  Function - Expression Expression: Verbal Expression assistive device: Communication board Expression assist level: Expresses complex ideas: With no assist  Function - Social Interaction Social Interaction assist level: Interacts  appropriately with others with medication or extra time (anti-anxiety, antidepressant).(effexor, aricept, namenda)  Function - Problem Solving Problem solving assist level: Solves complex 90% of the time/cues < 10% of the time  Function - Memory Memory assist level: More than reasonable amount of time Patient normally able to recall (first 3 days only): Current season, Staff names and faces, That he or she is in a hospital, Location of own room  Medical Problem List and Plan: 1.Decreased functional mobilitysecondary to T10-11 osteomyelitis/epidural abscess with subsequent myelopathy/paraplegia, status post decompression and fusion T8-L3. Back brace when out of bed. Patient with history of multiple lumbar surgeries as well as spinal cord stimulator January 2019   Continue CIR   Notes reviewed, labs reviewed 2. DVT left soleal, right peroneal DVT's:.     prophylactic doses of Lovenox started on 4/12.    Repeat dopplers tomorrow 3. Pain Management:Neurontin 600 mg 3 times daily, Robaxin   as needed   Lidoderm patch started on 4/13   -scheduled tylenol  -oxycodone prn.  -ROM/improved technique to reduce stress on left shoulder 4. Mood:Aricept 10 mg nightly, Namenda 10 mg twice daily, Effexor 150 mg twice daily, trazodone 100 mg nightly 5. Neuropsych: This patientisfully capable of making decisions on hisown behalf. 6. Skin/Wound Care:staples out per NS 7. Fluids/Electrolytes/Nutrition:Routine I/O's  -supplementing potassium KCl 20 mEq/day.    Potassium 3.9 on 4/30  Appetite  improved      8.ID/MSSA bacteremia.Intravenous Ancef 2 g every 8 hours through 05/22/2018 and stop as well as rifampin 300 mg twice daily.    -outpt Follow-up per infectious disease 9.Acute on chronic anemia.     Hemoglobin  9.0 on 4/30  Follow up this week  10.Hypertension. Norvasc 10 mg daily   Vitals:   04/22/18 1433 04/23/18 0611  BP: (!) 141/83 (!) 149/87  Pulse: 75 82  Resp: 19 18   Temp: 98.3 F (36.8 C) 98.5 F (36.9 C)  SpO2: 98% 96%    ?Trending up on  5/5, previously controlled, monitor for trend 11.Hyperlipidemia. Pravachol 12.History of prostate cancer. Proscar 5 mg daily 13.Constipation.     -results with linzess  -dced soft/lax 14. Urine retention, occasional incontinence seems to be improved  ---Ua negative, ucx negative 15. Morbid obesity   Body mass index is 34.31 kg/m.  Diet and exercise education  Encourage weight loss to increase endurance and promote overall health 16. Sleep disturbance  Melatonin added to trazodone on 5/5   LOS (Days) 24 A FACE TO FACE EVALUATION WAS PERFORMED  Alexandra Lipps Lorie Phenix 04/23/2018, 7:18 AM

## 2018-04-24 ENCOUNTER — Inpatient Hospital Stay (HOSPITAL_COMMUNITY): Payer: Medicare Other | Admitting: Occupational Therapy

## 2018-04-24 ENCOUNTER — Ambulatory Visit (HOSPITAL_COMMUNITY): Payer: Medicare Other | Attending: Physical Medicine & Rehabilitation

## 2018-04-24 ENCOUNTER — Inpatient Hospital Stay (HOSPITAL_COMMUNITY): Payer: Federal, State, Local not specified - PPO

## 2018-04-24 ENCOUNTER — Inpatient Hospital Stay (HOSPITAL_COMMUNITY): Payer: Federal, State, Local not specified - PPO | Admitting: Physical Therapy

## 2018-04-24 DIAGNOSIS — G8918 Other acute postprocedural pain: Secondary | ICD-10-CM | POA: Diagnosis not present

## 2018-04-24 DIAGNOSIS — I824Z3 Acute embolism and thrombosis of unspecified deep veins of distal lower extremity, bilateral: Secondary | ICD-10-CM | POA: Diagnosis not present

## 2018-04-24 LAB — CBC
HCT: 33.7 % — ABNORMAL LOW (ref 39.0–52.0)
Hemoglobin: 9.7 g/dL — ABNORMAL LOW (ref 13.0–17.0)
MCH: 24.7 pg — ABNORMAL LOW (ref 26.0–34.0)
MCHC: 28.8 g/dL — ABNORMAL LOW (ref 30.0–36.0)
MCV: 85.8 fL (ref 78.0–100.0)
Platelets: 724 10*3/uL — ABNORMAL HIGH (ref 150–400)
RBC: 3.93 MIL/uL — ABNORMAL LOW (ref 4.22–5.81)
RDW: 16.8 % — ABNORMAL HIGH (ref 11.5–15.5)
WBC: 7.5 10*3/uL (ref 4.0–10.5)

## 2018-04-24 MED ORDER — HYDROCORTISONE 2.5 % RE CREA
TOPICAL_CREAM | Freq: Three times a day (TID) | RECTAL | Status: DC | PRN
  Filled 2018-04-24: qty 28.35

## 2018-04-24 NOTE — Progress Notes (Signed)
Occupational Therapy Session Note  Patient Details  Name: Cody Velez MRN: [id number] Date of Birth: 06-02-1946  Today's Date: 04/24/2018 OT Individual Time: 1030-1120 OT Individual Time Calculation (min): 50 min  and Today's Date: 04/24/2018 OT Missed Time: 10 Minutes Missed Time Reason: Patient fatigue   Short Term Goals: Week 4:  OT Short Term Goal 1 (Week 4): Pt will pull pants up><down with mod A  OT Short Term Goal 2 (Week 4): Pt will tolerate standing for 1 minute for grooming at sink with min A  OT Short Term Goal 3 (Week 4): Pt will transfer to [facility] with min A   Skilled Therapeutic Interventions/Progress Updates:    Upon entering the room, pt sleeping soundly in bed. Pt reports feeling "tired" but agreeable to OT intervention. Supine >sit with log roll to EOB with supervision and use of bed rails. Pt donning TLSO with assistance and lateral scoot into wheelchair with steady assistance. Pt propelled wheelchair with B UE's 100' to gym with increased time. Pt standing once from wheelchair with use of RW and armrests with max A for lifting. Pt able to stand for 20 seconds before knees buckling and needing to sit. Pt taking rest break and requesting to stand again. On second attempt, pt standing with mod A but large anterior weight shift and B knees buckling causing therapist to total A pt back into wheelchair for safety. Pt reports fatigue and propelled wheelchair back to room. Pt performs lateral scoot into bed with min A and missing 10 minutes secondary to fatigue. Call bell and all needed items within reach. Bed alarm activated upon exiting the room.  Therapy Documentation Precautions:  Precautions Precautions: Fall, Back Precaution Booklet Issued: No Precaution Comments: Pt needed min cues to recall 3/3 back precautions Required Braces or Orthoses: Spinal Brace Spinal Brace: Thoracolumbosacral orthotic, Applied in sitting position Restrictions Weight Bearing Restrictions:  No General: General OT Amount of Missed Time: -1 Minutes Vital Signs:   Pain: Pain Assessment Pain Score: 4  Faces Pain Scale: Hurts little more PAINAD (Pain Assessment in Advanced Dementia) Breathing: normal Negative Vocalization: none Facial Expression: smiling or inexpressive Body Language: relaxed Consolability: no need to console PAINAD Score: 0 ADL: ADL ADL Comments: Please see functional navigator Vision   Perception    Praxis   Exercises:   Other Treatments:    See Function Navigator for Current Functional Status.   Therapy/Group: Individual Therapy  Gypsy Decant 04/24/2018, 11:23 AM

## 2018-04-24 NOTE — Progress Notes (Signed)
Subjective/Complaints: Slept better last night. States he had a bloody appearing stool yesterday morning which alarmed him. I see no record of it being bloody in chart. He reports a history of hemorrhoids with some recent irritation  ROS: Patient denies fever, rash, sore throat, blurred vision, nausea, vomiting, diarrhea, cough, shortness of breath or chest pain, joint or back pain, headache, or mood change.    Objective: Vital Signs: Blood pressure 131/79, pulse 89, temperature 98 F (36.7 C), temperature source Oral, resp. rate 18, height 6\' 3"  (1.905 m), weight 124.4 kg (274 lb 4 oz), SpO2 96 %. No results found. No results found for this or any previous visit (from the past 72 hour(s)).   Constitutional: No distress . Vital signs reviewed. HEENT: EOMI, oral membranes moist Neck: supple Cardiovascular: RRR without murmur. No JVD    Respiratory: CTA Bilaterally without wheezes or rales. Normal effort    GI: BS +, non-tender, non-distended  Musc: No edema or tenderness in extremities. Neuro: Alert and Oriented.  Motor: 5/5 in BUE proximal to distal RLE: hip flexion 3+/5, knee extension 3+/5, ankle dorsiflexion/plantarflexion 4/5 (stable) LLE: Hip flexion   3-/5, KE 3/5 ankle dorsiflexion/PF 3+/5 (stable) Skin: Warm and dry.     Assessment/Plan: 1. Functional deficits secondary to paraplegia secondary to epidural abscess which require 3+ hours per day of interdisciplinary therapy in a comprehensive inpatient rehab setting. Physiatrist is providing close team supervision and 24 hour management of active medical problems listed below. Physiatrist and rehab team continue to assess barriers to discharge/monitor patient progress toward functional and medical goals. FIM: Function - Bathing Bathing activity did not occur: Refused Position: Shower Body parts bathed by patient: Right arm, Left arm, Chest, Abdomen, Buttocks, Right upper leg, Left upper leg, Front perineal area, Right lower  leg, Left lower leg, Back Body parts bathed by helper: Right lower leg, Left lower leg, Back Bathing not applicable: Buttocks, Right upper leg, Left upper leg, Right lower leg, Left lower leg, Back Assist Level: Assistive device, Supervision or verbal cues Assistive Device Comment: LH sponge Set up : To obtain items  Function- Upper Body Dressing/Undressing Upper body dressing/undressing activity did not occur: Refused What is the patient wearing?: Pull over shirt/dress, Orthosis Pull over shirt/dress - Perfomed by patient: Put head through opening, Thread/unthread left sleeve, Thread/unthread right sleeve, Pull shirt over trunk Pull over shirt/dress - Perfomed by helper: Pull shirt over trunk Orthosis activity level: Performed by helper Assist Level: Supervision or verbal cues Function - Lower Body Dressing/Undressing What is the patient wearing?: Pants, Non-skid slipper socks Position: Other (comment)(sitting on BSC in shower) Underwear - Performed by patient: Pull underwear up/down Underwear - Performed by helper: Thread/unthread left underwear leg, Thread/unthread right underwear leg Pants- Performed by patient: Thread/unthread right pants leg, Thread/unthread left pants leg Pants- Performed by helper: Pull pants up/down Non-skid slipper socks- Performed by patient: Don/doff right sock, Don/doff left sock Non-skid slipper socks- Performed by helper: Don/doff right sock, Don/doff left sock Socks - Performed by patient: Don/doff right sock, Don/doff left sock Socks - Performed by helper: Don/doff right sock, Don/doff left sock Shoes - Performed by patient: Fasten right, Fasten left Shoes - Performed by helper: Don/doff right shoe, Don/doff left shoe Assist for footwear: Partial/moderate assist Assist for lower body dressing: Assistive device(sit<stand in [place]) Assistive Device Comment: Reacher + sock aide  Function - Toileting Toileting activity did not occur: No continent  bowel/bladder event Toileting steps completed by patient: Adjust clothing after toileting Toileting steps completed  by helper: Adjust clothing prior to toileting, Adjust clothing after toileting Assist level: Touching or steadying assistance (Pt.75%)  Function - Toilet Transfers Toilet transfer activity did not occur: N/A Toilet transfer assistive device: Mechanical lift, Elevated toilet seat/BSC over toilet Mechanical lift: Stedy Assist level to toilet: Maximal assist (Pt 25 - 49%/lift and lower) Assist level from toilet: Maximal assist (Pt 25 - 49%/lift and lower)  Function - Chair/bed transfer Chair/bed transfer activity did not occur: N/A Chair/bed transfer method: Lateral scoot Chair/bed transfer assist level: Touching or steadying assistance (Pt > 75%) Chair/bed transfer assistive device: Armrests, Orthosis Mechanical lift: Stedy Chair/bed transfer details: Verbal cues for technique, Verbal cues for precautions/safety  Function - Locomotion: Wheelchair Will patient use wheelchair at discharge?: Yes Type: Manual Wheelchair activity did not occur: N/A Max wheelchair distance: 150 Assist Level: No help, No cues, assistive device, takes more than reasonable amount of time Wheel 50 feet with 2 turns activity did not occur: N/A Assist Level: No help, No cues, assistive device, takes more than reasonable amount of time Wheel 150 feet activity did not occur: N/A Assist Level: No help, No cues, assistive device, takes more than reasonable amount of time Turns around,maneuvers to table,bed, and toilet,negotiates 3% grade,maneuvers on rugs and over doorsills: No Function - Locomotion: Ambulation Ambulation activity did not occur: N/A Assistive device: Parallel bars, Maxi sky Max distance: 10' Assist level: Maximal assist (Pt 25 - 49%) Walk 10 feet activity did not occur: N/A Assist level: Maximal assist (Pt 25 - 49%)(support from maxi sky) Walk 50 feet with 2 turns activity did not  occur: N/A Walk 150 feet activity did not occur: N/A Walk 10 feet on uneven surfaces activity did not occur: N/A  Function - Comprehension Comprehension: Auditory Comprehension assist level: Follows complex conversation/direction with no assist  Function - Expression Expression: Verbal Expression assistive device: Communication board Expression assist level: Expresses complex ideas: With no assist  Function - Social Interaction Social Interaction assist level: Interacts appropriately with others with medication or extra time (anti-anxiety, antidepressant).(effexor, aricept, namenda)  Function - Problem Solving Problem solving assist level: Solves complex 90% of the time/cues < 10% of the time  Function - Memory Memory assist level: More than reasonable amount of time Patient normally able to recall (first 3 days only): Current season, Staff names and faces, That he or she is in a hospital, Location of own room  Medical Problem List and Plan: 1.Decreased functional mobilitysecondary to T10-11 osteomyelitis/epidural abscess with subsequent myelopathy/paraplegia, status post decompression and fusion T8-L3. Back brace when out of bed. Patient with history of multiple lumbar surgeries as well as spinal cord stimulator January 2019   Continue CIR   Notes reviewed, labs reviewed 2. DVT left soleal, right peroneal DVT's:.     prophylactic doses of Lovenox started on 4/12.    Repeat dopplers today 3. Pain Management:Neurontin 600 mg 3 times daily, Robaxin   as needed   Lidoderm patch started on 4/13   -scheduled tylenol  -oxycodone prn.  -ROM/improved technique to reduce stress on left shoulder 4. Mood:Aricept 10 mg nightly, Namenda 10 mg twice daily, Effexor 150 mg twice daily, trazodone 100 mg nightly 5. Neuropsych: This patientisfully capable of making decisions on hisown behalf. 6. Skin/Wound Care:staples out per NS 7. Fluids/Electrolytes/Nutrition:Routine  I/O's  -supplementing potassium KCl 20 mEq/day.    Potassium 3.9 on 4/30  Appetite  improved      8.ID/MSSA bacteremia.Intravenous Ancef 2 g every 8 hours through 05/22/2018 and stop as  well as rifampin 300 mg twice daily.    -outpt Follow-up per infectious disease 9.Acute on chronic anemia.     Hemoglobin  9.0 on 4/30  Given reports of blood in stool will recheck today  -hemorrhoid cream prn  10.Hypertension. Norvasc 10 mg daily   Vitals:   04/23/18 2105 04/24/18 0238  BP:  131/79  Pulse:  89  Resp:  18  Temp:  98 F (36.7 C)  SpO2: 95% 96%    Reasonable control 5/6 11.Hyperlipidemia. Pravachol 12.History of prostate cancer. Proscar 5 mg daily 13.Constipation.     -results with linzess  -dced soft/lax 14. Urine retention, occasional incontinence seems to be improved  ---Ua negative, ucx negative 15. Morbid obesity   Body mass index is 34.28 kg/m.  Diet and exercise education  Encourage weight loss to increase endurance and promote overall health 16. Sleep disturbance  Melatonin added to trazodone on 5/5 with results.   LOS (Days) [place] 04/24/2018, 8:45 AM

## 2018-04-24 NOTE — Progress Notes (Signed)
Occupational Therapy Session Note  Patient Details  Name: Cody Velez MRN: [id number] Date of Birth: 13-Apr-1946  Today's Date: 04/24/2018 OT Individual Time: 3154-0086 OT Individual Time Calculation (min): 90 min    Short Term Goals: Week 3:  OT Short Term Goal 1 (Week 3): Pt will transfer to [facility] with min A  OT Short Term Goal 1 - Progress (Week 3): Progressing toward goal OT Short Term Goal 2 (Week 3): Pt will complete LB bathing with min A using AE PRN OT Short Term Goal 2 - Progress (Week 3): Met OT Short Term Goal 3 (Week 3): Pt will pull pants up><down with mod A  OT Short Term Goal 3 - Progress (Week 3): Progressing toward goal OT Short Term Goal 4 (Week 3): Pt will tolerate standing for 1 minute for grooming at sink with min A  OT Short Term Goal 4 - Progress (Week 3): Progressing toward goal  Skilled Therapeutic Interventions/Progress Updates:    Pt resting in bed upon arrival and agreeable to participating in therapy.  Pt declined shower this morning but engaged in bathing/dressing tasks at bed level and sitting EOB.  Pt declined changing shorts this morning. Pt performed bed mobility without assistance and sat EOB for donning shirt and TLSO. Pt performs scoot transfer at supervision level. Pt requested to use urinal and performed sit<>stand with Stedy but pt had already voided.  Pt stood to change brief and assisted with pulling up pants.  Pt engaged in w/c mobility tasks, sit<>stand, and standing balance tasks to increase standing tolerance/BLE strength to increase independence with BADLs.  Pt stand approx 30 seconds before sitting back into chair.  Pt returned to bed and remained in bed with all needs within reach.   Therapy Documentation Precautions:  Precautions Precautions: Fall, Back Precaution Booklet Issued: No Precaution Comments: Pt needed min cues to recall 3/3 back precautions Required Braces or Orthoses: Spinal Brace Spinal Brace: Thoracolumbosacral orthotic,  Applied in sitting position Restrictions Weight Bearing Restrictions: No    Pain: Pain Assessment Pain Scale: 0-10 Pain Score: 7  Faces Pain Scale: Hurts little more Pain Type: Acute pain;Chronic pain;Surgical pain Pain Location: Back Pain Orientation: Mid;Lower Pain Descriptors / Indicators: Aching Pain Frequency: Constant Pain Onset: On-going Patients Stated Pain Goal: 3 Pain Intervention(s): Medication (See eMAR);Emotional support Multiple Pain Sites: No PAINAD (Pain Assessment in Advanced Dementia) Breathing: normal Negative Vocalization: none Facial Expression: smiling or inexpressive Body Language: relaxed Consolability: no need to console PAINAD Score: 0  See Function Navigator for Current Functional Status.   Therapy/Group: Individual Therapy  [name] 04/24/2018, 12:24 PM

## 2018-04-24 NOTE — Progress Notes (Signed)
Physical Therapy Session Note  Patient Details  Name: Cody Velez MRN: [id number] Date of Birth: February 27, 1946  Today's Date: 04/24/2018 PT Individual Time: 1300-1330 PT Individual Time Calculation (min): 30 min   Short Term Goals: Week 2:  PT Short Term Goal 1 (Week 2): Pt will demonstrate sit<>stand with RW and max assist +1 PT Short Term Goal 1 - Progress (Week 2): Met PT Short Term Goal 2 (Week 2): pt will initiate pre-gait training with PT PT Short Term Goal 2 - Progress (Week 2): Met PT Short Term Goal 3 (Week 2): Pt will propel manual w/c x50' with supervision PT Short Term Goal 3 - Progress (Week 2): Met  Skilled Therapeutic Interventions/Progress Updates:    no c/o pain at rest.  Session focus on LE stretching.  PT provided PROM stretching and educated pt on self stretching for heel cords, hamstrings (distal and proximal), IR/ER, and hip flexors 3x30 seconds each.  Demonstrated how to perform heel cord and hamstring and hip flexor stretch with AD.  Left positioned in supine with call bell in reach and needs met.   Therapy Documentation Precautions:  Precautions Precautions: Fall, Back Precaution Booklet Issued: No Precaution Comments: Pt needed min cues to recall 3/3 back precautions Required Braces or Orthoses: Spinal Brace Spinal Brace: Thoracolumbosacral orthotic, Applied in sitting position Restrictions Weight Bearing Restrictions: No   See Function Navigator for Current Functional Status.   Therapy/Group: Individual Therapy  Michel Santee 04/24/2018, 1:33 PM

## 2018-04-24 NOTE — Plan of Care (Signed)
  Problem: Consults Goal: RH SPINAL CORD INJURY PATIENT EDUCATION Description Pt able to verbalize and initiate education regarding spinal cord injury with MOD I assist   04/24/2018 1515 by Dietrich Pates, RN Outcome: Progressing 04/24/2018 1514 by Dietrich Pates, RN Outcome: Progressing Goal: Skin Care Protocol Initiated - if Braden Score 18 or less Description Pt will have improved skin breakdown while in rehab with min assist   04/24/2018 1515 by Dietrich Pates, RN Outcome: Progressing 04/24/2018 1514 by Dietrich Pates, RN Outcome: Progressing   Problem: SCI BOWEL ELIMINATION Goal: RH STG MANAGE BOWEL WITH ASSISTANCE Description STG Manage Bowel with min Assistance.  04/24/2018 1515 by Dietrich Pates, RN Outcome: Progressing 04/24/2018 1514 by Dietrich Pates, RN Outcome: Progressing Goal: RH STG SCI MANAGE BOWEL WITH MEDICATION WITH ASSISTANCE Description STG SCI Manage bowel with medication with Min assistance.  04/24/2018 1515 by Dietrich Pates, RN Outcome: Progressing 04/24/2018 1514 by Dietrich Pates, RN Outcome: Progressing   Problem: SCI BLADDER ELIMINATION Goal: RH STG MANAGE BLADDER WITH ASSISTANCE Description STG Manage Bladder With min Assistance  04/24/2018 1515 by Dietrich Pates, RN Outcome: Progressing 04/24/2018 1514 by Dietrich Pates, RN Outcome: Progressing   Problem: RH SKIN INTEGRITY Goal: RH STG SKIN FREE OF INFECTION/BREAKDOWN Description Skin will remain free from breakdown with min assist  04/24/2018 1515 by Dietrich Pates, RN Outcome: Progressing 04/24/2018 1514 by Dietrich Pates, RN Outcome: Progressing Goal: RH STG MAINTAIN SKIN INTEGRITY WITH ASSISTANCE Description STG Maintain Skin Integrity With min Assistance.  04/24/2018 1515 by Dietrich Pates, RN Outcome: Progressing 04/24/2018 1514 by Dietrich Pates, RN Outcome: Progressing   Problem: RH SAFETY Goal: RH STG ADHERE TO SAFETY PRECAUTIONS  W/ASSISTANCE/DEVICE Description STG Adhere to Safety Precautions With min  Assistance/Device.  04/24/2018 1515 by Dietrich Pates, RN Outcome: Progressing 04/24/2018 1514 by Dietrich Pates, RN Outcome: Progressing   Problem: RH PAIN MANAGEMENT Goal: RH STG PAIN MANAGED AT OR BELOW PT'S PAIN GOAL Description Pain goal is less than 4 with min assist of PRN medications   04/24/2018 1515 by Dietrich Pates, RN Outcome: Progressing 04/24/2018 1514 by Dietrich Pates, RN Outcome: Progressing

## 2018-04-24 NOTE — Progress Notes (Signed)
Physical Therapy Weekly Progress Note  Patient Details  Name: Cody Velez MRN: [id number] Date of Birth: [birth year]  Beginning of progress report period: April 14, 2018 End of progress report period: Apr 24, 2018  Today's Date: 04/24/2018 PT MissedTime:  60 min  Patient has met 3 of 4 short term goals.  Pt continues to make slow but steady progress with PT.  He is limited significantly by decreased muscle endurance for functional mobility.  He is able to complete lateral scoot transfers with consistent min assist, and has been able to tolerate sit<>stands for weight bearing, but is limited in his ability to maintain safe standing posture for carryover into transfers, gait, or ADLs.  Patient continues to demonstrate the following deficits muscle weakness, muscle joint tightness and muscle paralysis, decreased cardiorespiratoy endurance, impaired timing and sequencing, abnormal tone, unbalanced muscle activation, ataxia, decreased coordination and decreased motor planning, decreased memory and decreased standing balance, decreased postural control and decreased balance strategies and therefore will continue to benefit from skilled PT intervention to increase functional independence with mobility.  Patient progressing toward long term goals..  Continue plan of care.  PT Short Term Goals Week 3:  PT Short Term Goal 1 (Week 3): Pt will demonstrate static standing with LRAD and mod assist for maintaining midline orientation (A/P direction).  PT Short Term Goal 1 - Progress (Week 3): Met PT Short Term Goal 2 (Week 3): Pt will propel w/c 100' and supervision for strengthening and cardiovascular endurance  PT Short Term Goal 2 - Progress (Week 3): Met PT Short Term Goal 3 (Week 3): Pt will tolerate static stance x1 minute for decreased burden of care with ADLs PT Short Term Goal 3 - Progress (Week 3): Not met PT Short Term Goal 4 (Week 3): Pt will initiate pre-gait with PT.  PT Short Term Goal 4 -  Progress (Week 3): Met Week 4:  PT Short Term Goal 1 (Week 4): Pt will tolerate 30 seconds of static stance with BUE for caregiver to assist with LB dressing/ADLs.  PT Short Term Goal 2 (Week 4): Pt will complete slide board transfer with assist for board placement and supervision for transfer PT Short Term Goal 3 (Week 4): Pt will recall 2 methods of pressure relief while in w/c with min cues PT Short Term Goal 4 (Week 4): Pt will be able to direct caregiver for donning TLSO with min question cues.   Skilled Therapeutic Interventions/Progress Updates:    arrived for scheduled therapy session.  Pt off unit getting dopplers for BLEs.  RN states pt left around 3, had not returned by 415.  Missed 60 minutes of skilled PT.   Therapy Documentation Precautions:  Precautions Precautions: Fall, Back Precaution Booklet Issued: No Precaution Comments: Pt needed min cues to recall 3/3 back precautions Required Braces or Orthoses: Spinal Brace Spinal Brace: Thoracolumbosacral orthotic, Applied in sitting position Restrictions Weight Bearing Restrictions: No General: PT Amount of Missed Time (min): 60 Minutes PT Missed Treatment Reason: Unavailable (Comment)(off floor for dopplers)   See Function Navigator for Current Functional Status.  Therapy/Group: Individual Therapy  Michel Santee 04/24/2018, 4:16 PM

## 2018-04-24 NOTE — Progress Notes (Signed)
Pt. States he can place his cpap on when he is ready. RT informed pt. To notify if he needs any assistance.

## 2018-04-24 NOTE — Progress Notes (Signed)
Bilateral lower extremity venous duplex completed. Preliminary results -  Positive for an acute DVT of one pf the paired veins of the peroneal, left Soleal and left peroneal vein. [address], [place], [place] 04/24/2018 4:24 PM

## 2018-04-24 NOTE — Progress Notes (Signed)
Slept better last night with addition of melatonin. Pain managed with scheduled meds and PRN OXY ir. PRN Oxy IR given X 2 over past 12 hours. Cody Velez A

## 2018-04-25 ENCOUNTER — Inpatient Hospital Stay (HOSPITAL_COMMUNITY): Payer: Medicare Other | Admitting: Occupational Therapy

## 2018-04-25 ENCOUNTER — Inpatient Hospital Stay (HOSPITAL_COMMUNITY): Payer: Medicare Other | Admitting: Physical Therapy

## 2018-04-25 ENCOUNTER — Inpatient Hospital Stay (HOSPITAL_COMMUNITY): Payer: Medicare Other

## 2018-04-25 ENCOUNTER — Inpatient Hospital Stay: Payer: Self-pay

## 2018-04-25 MED ORDER — SORBITOL 70 % SOLN
30.0000 mL | Status: AC
  Administered 2018-04-25: 30 mL via ORAL
  Filled 2018-04-25: qty 30

## 2018-04-25 NOTE — Progress Notes (Signed)
Occupational Therapy Session Note  Patient Details  Name: Cody Velez MRN: [id number] Date of Birth: 11/03/1946  Today's Date: 04/25/2018 OT Individual Time: 3735-7897 OT Individual Time Calculation (min): 85 min    Short Term Goals: Week 3:  OT Short Term Goal 1 (Week 3): Pt will transfer to [facility] with min A  OT Short Term Goal 1 - Progress (Week 3): Progressing toward goal OT Short Term Goal 2 (Week 3): Pt will complete LB bathing with min A using AE PRN OT Short Term Goal 2 - Progress (Week 3): Met OT Short Term Goal 3 (Week 3): Pt will pull pants up><down with mod A  OT Short Term Goal 3 - Progress (Week 3): Progressing toward goal OT Short Term Goal 4 (Week 3): Pt will tolerate standing for 1 minute for grooming at sink with min A  OT Short Term Goal 4 - Progress (Week 3): Progressing toward goal Week 4:  OT Short Term Goal 1 (Week 4): Pt will pull pants up><down with mod A  OT Short Term Goal 2 (Week 4): Pt will tolerate standing for 1 minute for grooming at sink with min A  OT Short Term Goal 3 (Week 4): Pt will transfer to [facility] with min A   Skilled Therapeutic Interventions/Progress Updates:    OT intervention with focus on bathing at shower level, dressing at EOB with AE, and bed mobility.  Pt performs all bed mobility at supervision level.  Pt requires Stedy for transfers but performs sit<>stand at supervision level.  Pt completes bathing tasks at mod A/min A level with AE.  Pt completes dressing tasks with assistance to pull up pants and don/fasten shoes.  Pt requires more than a reasnable amount of time to complete tasks with multiple rest breaks.  Pt fatigues quickly. Pt returned to bed and remained in bed with girlfriend present.  Pt voiced concern that he may not be able to walk again.  Pt agreeable to neuropsych consult.  CSW notified.   Therapy Documentation Precautions:  Precautions Precautions: Fall, Back Precaution Booklet Issued: No Precaution Comments: Pt needed  min cues to recall 3/3 back precautions Required Braces or Orthoses: Spinal Brace Spinal Brace: Thoracolumbosacral orthotic, Applied in sitting position Restrictions Weight Bearing Restrictions: No   Pain:  Pt c/o 7/10 pain in back; repositioned and RN aware  See Function Navigator for Current Functional Status.   Therapy/Group: Individual Therapy  [name] 04/25/2018, 9:35 AM

## 2018-04-25 NOTE — Progress Notes (Signed)
This RN received a call from [place], Therapist, sports at approx. [phone]. Morey Hummingbird stated the CXR shows the PICC is at the (R) and (L) Brachial Cephalic Junction and will have to be exchanged or replaced. CXR results referred to Dr. Naaman Plummer. Verbal Orders received for PICC Line Exchange. Verbal Orders Read Back and Verified. Orders placed for PICC Line Exchange.

## 2018-04-25 NOTE — Progress Notes (Signed)
Physical Therapy Session Note  Patient Details  Name: Cody Velez MRN: [id number] Date of Birth: 09/20/1946  Today's Date: 04/25/2018 PT Individual Time: 1100-1200 PT Individual Time Calculation (min): 60 min   Short Term Goals: Week 4:  PT Short Term Goal 1 (Week 4): Pt will tolerate 30 seconds of static stance with BUE for caregiver to assist with LB dressing/ADLs.  PT Short Term Goal 2 (Week 4): Pt will complete slide board transfer with assist for board placement and supervision for transfer PT Short Term Goal 3 (Week 4): Pt will recall 2 methods of pressure relief while in w/c with min cues PT Short Term Goal 4 (Week 4): Pt will be able to direct caregiver for donning TLSO with min question cues.   Skilled Therapeutic Interventions/Progress Updates:    no c/o pain.  Session focus on pt/family education regarding progress towards goals and discharge planning, pt self directing care, transfers with slideboard, sitting balance and maintaining back precautions for donning socks/shoes, and NMR for standing tolerance/muscle activation in standing frame.   Pt requires supervision to transition to sitting EOB, appears more unsteady sitting EOB initially requiring close supervision and BUE support to maintain static sitting balance but improves with time EOB.  Pt directed PT in donning brace with min question cues.  Pt able to use sock aid to don socks with increased time.  Shoes donned total assist for time management.  PT instructed pt in use of slide board for transfers, and once PT positioned board pt able to complete with supervision and verbal cues for positioning of LEs and maintaining "lift>scoot" technique from lateral scoot transfers.  W/C propulsion throughout unit for UE strengthening and cardiovascular endurance.  Standing frame x2 minutes +5 minutes focus on activation of quads/glutes for LE extension and use of UEs to assist with trunk extension.  Pt able to maintain fully upright  posture for about 10 seconds with heavy reliance on UEs.  Returned to room in w/c and left in care of RN and significant other.    Therapy Documentation Precautions:  Precautions Precautions: Fall, Back Precaution Booklet Issued: No Precaution Comments: Pt needed min cues to recall 3/3 back precautions Required Braces or Orthoses: Spinal Brace Spinal Brace: Thoracolumbosacral orthotic, Applied in sitting position Restrictions Weight Bearing Restrictions: No   See Function Navigator for Current Functional Status.   Therapy/Group: Individual Therapy  Michel Santee 04/25/2018, 12:44 PM

## 2018-04-25 NOTE — Progress Notes (Addendum)
Subjective/Complaints: States that he couldn't sleep last nigh because he was up wondering if he would ever walk again. I asked him if he recalled our conversations about this very topic, and he told me no. Skin irritated at PICC line bandage site.  ROS: Patient denies fever, rash, sore throat, blurred vision, nausea, vomiting, diarrhea, cough, shortness of breath or chest pain,   headache, or mood change.    Objective: Vital Signs: Blood pressure (!) 147/86, pulse 89, temperature 99.5 F (37.5 C), temperature source Oral, resp. rate 16, height 6\' 3"  (1.905 m), weight 122.7 kg (270 lb 8.1 oz), SpO2 98 %. No results found. Results for orders placed or performed during the hospital encounter of 03/30/18 (from the past 72 hour(s))  CBC     Status: Abnormal   Collection Time: 04/24/18  9:45 AM  Result Value Ref Range   WBC 7.5 4.0 - 10.5 K/uL   RBC 3.93 (L) 4.22 - 5.81 MIL/uL   Hemoglobin 9.7 (L) 13.0 - 17.0 g/dL   HCT 33.7 (L) 39.0 - 52.0 %   MCV 85.8 78.0 - 100.0 fL   MCH 24.7 (L) 26.0 - 34.0 pg   MCHC 28.8 (L) 30.0 - 36.0 g/dL   RDW 16.8 (H) 11.5 - 15.5 %   Platelets 724 (H) 150 - 400 K/uL    Comment: Performed at [facility], [address] [place], [place] [address]     Constitutional: No distress . Vital signs reviewed. obese HEENT: EOMI, oral membranes moist Neck: supple Cardiovascular: RRR without murmur. No JVD    Respiratory: CTA Bilaterally without wheezes or rales. Normal effort    GI: BS +, non-tender, non-distended  Musc: No edema or tenderness in extremities. Neuro: Alert and Oriented.  Motor: 5/5 in BUE proximal to distal RLE: hip flexion 3+/5, knee extension 3+/5, ankle dorsiflexion/plantarflexion 4/5 (stable) LLE: Hip flexion   3-/5, KE 3/5 ankle dorsiflexion/PF 3+/5 (stable, knee affects) Skin: Warm and dry. Tape has caused irritation of skin. PICC itself looks good     Assessment/Plan: 1. Functional deficits secondary to paraplegia secondary to  epidural abscess which require 3+ hours per day of interdisciplinary therapy in a comprehensive inpatient rehab setting. Physiatrist is providing close team supervision and 24 hour management of active medical problems listed below. Physiatrist and rehab team continue to assess barriers to discharge/monitor patient progress toward functional and medical goals. FIM: Function - Bathing Bathing activity did not occur: Refused Position: Shower Body parts bathed by patient: Right arm, Left arm, Chest, Abdomen, Buttocks, Right upper leg, Left upper leg, Front perineal area, Right lower leg, Left lower leg, Back Body parts bathed by helper: Right lower leg, Left lower leg, Back Bathing not applicable: Buttocks, Right upper leg, Left upper leg, Right lower leg, Left lower leg, Back Assist Level: Assistive device, Supervision or verbal cues Assistive Device Comment: LH sponge Set up : To obtain items  Function- Upper Body Dressing/Undressing Upper body dressing/undressing activity did not occur: Refused What is the patient wearing?: Pull over shirt/dress, Orthosis Pull over shirt/dress - Perfomed by patient: Put head through opening, Thread/unthread left sleeve, Thread/unthread right sleeve, Pull shirt over trunk Pull over shirt/dress - Perfomed by helper: Pull shirt over trunk Orthosis activity level: Performed by helper Assist Level: Supervision or verbal cues Function - Lower Body Dressing/Undressing What is the patient wearing?: Pants, Non-skid slipper socks Position: Other (comment)(sitting on BSC in shower) Underwear - Performed by patient: Pull underwear up/down Underwear - Performed by helper:  Thread/unthread left underwear leg, Thread/unthread right underwear leg Pants- Performed by patient: Thread/unthread right pants leg, Thread/unthread left pants leg Pants- Performed by helper: Pull pants up/down Non-skid slipper socks- Performed by patient: Don/doff right sock, Don/doff left  sock Non-skid slipper socks- Performed by helper: Don/doff right sock, Don/doff left sock Socks - Performed by patient: Don/doff right sock, Don/doff left sock Socks - Performed by helper: Don/doff right sock, Don/doff left sock Shoes - Performed by patient: Fasten right, Fasten left Shoes - Performed by helper: Don/doff right shoe, Don/doff left shoe Assist for footwear: Partial/moderate assist Assist for lower body dressing: Assistive device(sit<stand in [place]) Assistive Device Comment: Reacher + sock aide  Function - Toileting Toileting activity did not occur: No continent bowel/bladder event Toileting steps completed by patient: Adjust clothing after toileting Toileting steps completed by helper: Adjust clothing prior to toileting, Adjust clothing after toileting Assist level: Touching or steadying assistance (Pt.75%)  Function - Air cabin crew transfer activity did not occur: N/A Toilet transfer assistive device: Mechanical lift, Elevated toilet seat/BSC over toilet Mechanical lift: Stedy Assist level to toilet: Maximal assist (Pt 25 - 49%/lift and lower) Assist level from toilet: Maximal assist (Pt 25 - 49%/lift and lower)  Function - Chair/bed transfer Chair/bed transfer activity did not occur: N/A Chair/bed transfer method: Lateral scoot Chair/bed transfer assist level: Touching or steadying assistance (Pt > 75%) Chair/bed transfer assistive device: Armrests, Orthosis Mechanical lift: Stedy Chair/bed transfer details: Verbal cues for technique, Verbal cues for precautions/safety  Function - Locomotion: Wheelchair Will patient use wheelchair at discharge?: Yes Type: Manual Wheelchair activity did not occur: N/A Max wheelchair distance: 100' Assist Level: No help, No cues, assistive device, takes more than reasonable amount of time Wheel 50 feet with 2 turns activity did not occur: N/A Assist Level: No help, No cues, assistive device, takes more than reasonable  amount of time Wheel 150 feet activity did not occur: N/A Assist Level: No help, No cues, assistive device, takes more than reasonable amount of time Turns around,maneuvers to table,bed, and toilet,negotiates 3% grade,maneuvers on rugs and over doorsills: No Function - Locomotion: Ambulation Ambulation activity did not occur: N/A Assistive device: Parallel bars, Maxi sky Max distance: 10' Assist level: Maximal assist (Pt 25 - 49%) Walk 10 feet activity did not occur: N/A Assist level: Maximal assist (Pt 25 - 49%)(support from maxi sky) Walk 50 feet with 2 turns activity did not occur: N/A Walk 150 feet activity did not occur: N/A Walk 10 feet on uneven surfaces activity did not occur: N/A  Function - Comprehension Comprehension: Auditory Comprehension assist level: Follows complex conversation/direction with no assist  Function - Expression Expression: Verbal Expression assistive device: Communication board Expression assist level: Expresses complex ideas: With no assist  Function - Social Interaction Social Interaction assist level: Interacts appropriately with others with medication or extra time (anti-anxiety, antidepressant).  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 Back brace when out of bed. Patient with history of multiple lumbar surgeries as well as spinal cord stimulator January 2019   Continue CIR   -reviewed prognosis at length today, friend  was present 2. DVT left soleal, right  peroneal DVT's:.     prophylactic doses of Lovenox started on 4/12.    Repeat dopplers with ongoing DVT's in peroneal (both?) and left soleal    -will need to discuss treatment dose rx. ?xarelto 3. Pain Management:Neurontin 600 mg 3 times daily, Robaxin   as needed   Lidoderm patch     -scheduled tylenol  -oxycodone prn.  -ROM/improved technique to reduce stress on left shoulder 4. Mood:Aricept 10 mg nightly, Namenda 10 mg twice daily, Effexor 150 mg twice daily, trazodone 100 mg nightly  -STM deficits  -provided positive reinforcement re: prognosis  5. Neuropsych: This patientisfully capable of making decisions on hisown behalf. 6. Skin/Wound Care:staples out per NS 7. Fluids/Electrolytes/Nutrition:Routine I/O's  -supplementing potassium KCl 20 mEq/day.    Potassium 3.9 on 4/30   8.ID/MSSA bacteremia.Intravenous Ancef 2 g every 8 hours through 05/22/2018 and stop as well as rifampin 300 mg twice daily.    -outpt Follow-up per infectious disease 9.Acute on chronic anemia.     Hemoglobin  9.0 on 4/30, repeat 9.7 5/6  -hemorrhoid cream prn  10.Hypertension. Norvasc 10 mg daily   Vitals:   04/25/18 0410 04/25/18 0805  BP: (!) 146/92 (!) 147/86  Pulse: 89   Resp: 16   Temp: 99.5 F (37.5 C)   SpO2: 98%     Borderline to Reasonable control 5/7 11.Hyperlipidemia. Pravachol 12.History of prostate cancer. Proscar 5 mg daily 13.Constipation.     -results with linzess  -dced soft/lax 14. Urine retention, occasional incontinence seems to be improved  ---Ua negative, ucx negative 15. Morbid obesity   Body mass index is 33.81 kg/m.  Diet and exercise education  Encourage weight loss to increase endurance and promote overall health 16. Sleep disturbance  Melatonin added to trazodone on 5/5 with results.   LOS (Days) [place] EVALUATION WAS PERFORMED  [name] 04/25/2018, 8:16 AM

## 2018-04-25 NOTE — Progress Notes (Signed)
PICC Exchange to right arm without difficulty. Tolerate well.

## 2018-04-25 NOTE — Progress Notes (Signed)
Patient has home CPAP in use at this time.

## 2018-04-25 NOTE — Progress Notes (Signed)
Occupational Therapy Session Note  Patient Details  Name: Cody Velez MRN: [id number] Date of Birth: 1946-06-05  Today's Date: 04/25/2018 OT Individual Time: 1005-1035 OT Individual Time Calculation (min): 30 min    Short Term Goals: Week 4:  OT Short Term Goal 1 (Week 4): Pt will pull pants up><down with mod A  OT Short Term Goal 2 (Week 4): Pt will tolerate standing for 1 minute for grooming at sink with min A  OT Short Term Goal 3 (Week 4): Pt will transfer to [facility] with min A   Skilled Therapeutic Interventions/Progress Updates:    Treatment session with focus on BLE stretching to increase independence with LB dressing.  Pt received supine in bed reporting fatigue from AM OT session, but willing to engage in exercises at bed level.  Therapeutic exercises with focus on hip flexion and abduction as needed to achieve figure 4 position for LB bathing and dressing.  Pt able to achieve figure 4 on Rt > Lt by end of session, reporting continued tightness in Lt hip.  Therapist provided additional stretch while in figure 4 position to pt tolerance.    Therapy Documentation Precautions:  Precautions Precautions: Fall, Back Precaution Booklet Issued: No Precaution Comments: Pt needed min cues to recall 3/3 back precautions Required Braces or Orthoses: Spinal Brace Spinal Brace: Thoracolumbosacral orthotic, Applied in sitting position Restrictions Weight Bearing Restrictions: No Pain:  Pt with no c/o pain  See Function Navigator for Current Functional Status.   Therapy/Group: Individual Therapy  Simonne Come 04/25/2018, 12:18 PM

## 2018-04-25 NOTE — Progress Notes (Signed)
Physical Therapy Session Note  Patient Details  Name: Cody Velez MRN: [id number] Date of Birth: 1946-05-15  Today's Date: 04/25/2018 PT Individual Time: 1330-1430 PT Individual Time Calculation (min): 60 min   Short Term Goals: Week 4:  PT Short Term Goal 1 (Week 4): Pt will tolerate 30 seconds of static stance with BUE for caregiver to assist with LB dressing/ADLs.  PT Short Term Goal 2 (Week 4): Pt will complete slide board transfer with assist for board placement and supervision for transfer PT Short Term Goal 3 (Week 4): Pt will recall 2 methods of pressure relief while in w/c with min cues PT Short Term Goal 4 (Week 4): Pt will be able to direct caregiver for donning TLSO with min question cues.   Skilled Therapeutic Interventions/Progress Updates:    w/c mobility on unit modified independent for functional mobility training and muscular endurance. NMR in parallel bars and maxi sky for gait training with focus on postural control, hip and trunk extension, and gait x 10' (5' forwards and then backwards) x 3 trials and then alternating toe taps on 2" step with focus on upright posture (pt with tendency for significant anterior lean) and activating and sustaining knee extension - pt continues to maintain flexed knee position with gait and/or in standing activities. Pt demonstrates impaired coordination and control during stepping but improved with repetition. Functional LE strengthening with reciprocal movement pattern retraining on Kinetron seated from w/c x 7 min. Pt's girlfriend, [name], assisting with legrest management throughout session with pt directing care.   Therapy Documentation Precautions:  Precautions Precautions: Fall, Back Precaution Booklet Issued: No Precaution Comments: Pt needed min cues to recall 3/3 back precautions Required Braces or Orthoses: Spinal Brace Spinal Brace: Thoracolumbosacral orthotic, Applied in sitting position Restrictions Weight Bearing  Restrictions: No  Pain: Pain Assessment Pain Scale: 0-10 Pain Score: 9  Faces Pain Scale: Hurts whole lot Pain Type: Acute pain Pain Location: Back Pain Orientation: Mid;Lower Pain Radiating Towards: N/A Pain Descriptors / Indicators: Aching Pain Frequency: Constant Pain Onset: On-going Patients Stated Pain Goal: 3 Pain Intervention(s): Medication (See eMAR);Emotional support   See Function Navigator for Current Functional Status.   Therapy/Group: Individual Therapy  Canary Brim Ivory Broad, PT, DPT  04/25/2018, 3:28 PM

## 2018-04-25 NOTE — Progress Notes (Signed)
CXR shows that PICC is at the junction of the right and left brachicephalic vein.  Crystal, RN updated and will notify MD of options.  Those options are to have PICC exchanged or removed and PIV started.  Carolee Rota, RN VAST

## 2018-04-26 ENCOUNTER — Inpatient Hospital Stay (HOSPITAL_COMMUNITY): Payer: Medicare Other | Admitting: Physical Therapy

## 2018-04-26 ENCOUNTER — Inpatient Hospital Stay (HOSPITAL_COMMUNITY): Payer: Medicare Other

## 2018-04-26 ENCOUNTER — Encounter (HOSPITAL_COMMUNITY): Payer: Medicare Other | Admitting: Psychology

## 2018-04-26 MED ORDER — LISINOPRIL 5 MG PO TABS
5.0000 mg | ORAL_TABLET | Freq: Every day | ORAL | Status: DC
  Administered 2018-04-26 – 2018-05-06 (×11): 5 mg via ORAL
  Filled 2018-04-26 (×11): qty 1

## 2018-04-26 MED ORDER — SORBITOL 70 % SOLN
60.0000 mL | Status: AC
  Filled 2018-04-26: qty 60

## 2018-04-26 MED ORDER — SENNOSIDES-DOCUSATE SODIUM 8.6-50 MG PO TABS
2.0000 | ORAL_TABLET | Freq: Every day | ORAL | Status: DC
  Administered 2018-04-26 – 2018-05-05 (×7): 2 via ORAL
  Filled 2018-04-26 (×8): qty 2

## 2018-04-26 NOTE — Consult Note (Signed)
Neuropsychological Consultation   Patient:   DOMENIC SCHOENBERGER   DOB:   1946-03-18  MR Number:  [id number]  Location:  [facility] [id number] [place] [address] Dept: [phone] MLY: [phone]           Date of Service:   04/26/2018  Start Time:   10 AM End Time:   11 AM  Provider/Observer:  Ilean Skill, Psy.D.       Clinical Neuropsychologist       Billing Code/Service: [id number] Units  Chief Complaint:    KAYODE PETION is a 72 year old male with history of hypertension, memory deficits, prostate cancer, PTSD, chronic back pain with multiple lumbar surgeries and spinal cord stimulator placement in January 2019.  Presented on 03/16/2018 after fall as well as hypoxemia and paraparesis.  X-rays revealed osteomyelitis T10-T11 with epidural abscess.  Bacteremia due to MSSA.  Laminectomy and decompression of epidural abscess debridement of discitis with segmental fixation per Dr. Ellene Route.  Patient has prior history of PTSD/Depression as well as reports of decline in memory over past 4 years.  Patient reports that Dr. Jannifer Franklin did a trial of Aricept and Namenda but patient does not fell this has helped much.  He does report that he feels his memory has improved a little as his medical status has improved.  STM and episodic memory remain mildly impaired.  Mental Status is good.  The above chief complaint is still valid with addition of issues related to the patient having more concerns and issues related to worry and fears that he may never walk again.  Patient has talked with Dr. Ellene Route and neurosurgeon is unsure about future outcome.   Reason for Service:  LADDIE MATH was referred for neuropsychological/psycholoigcal due to coping and adjustment concerns as well as prior history of memory decline and PTSD.  Below is the HPI for the current admission.  HPI: BERTIL BRICKEY a 72  y.o.right-handed malewith history of hypertension, memory deficits maintained on Aricept and Namenda, prostate cancer, PTSD, chronic back pain with multiple lumbar surgeries with recent spinal cord stimulator placement January 2019 as well as treatment for recent UTI. Per chart review patient lives with girlfriend. Independent with a cane prior to admissionand still driving. 2 level home with bedroom on main and 7 steps to entry with plan for ramp. Girlfriend can assist as neededhowever she is currently on vacation in [place] until April 20. Presented 03/16/2018 with lethargy/fall as well as hypoxemia with paraparesis. CT Angioof the chest negative for pulmonary emboli. Cranial CT scan negative for acute changes. Echocardiogram with ejection fraction of 60%. Systolic function was normal. EEG negative. X-rays and imaging revealed osteomyelitis T10-T11 with epidural abscess. Infectious disease consulted for bacteremia due to MSSA and maintained on broad-spectrum antibiotics of intravenous ceftezole and with plan for 8 weeks duration ending June 3, 2019as well as rifampin 300 mg twice daily. Patient underwent laminectomy T10-T11 decompression of epidural abscess debridement of discitis T10-T11 with segmental fixation from T8-L3 03/28/2018 per Dr. Ellene Route. TLSO back brace applied in sitting position. Hospital course pain management. Physical therapy evaluation completed 03/29/2018 with recommendations of physical medicine rehab consult.Patient was admitted for a comprehensive rehab program   Current Status:  The patient denies any worsening of Chronic PTSD issues and in fact reports that they have been better with so many things to think about and do.  The patient reports more worry about  not being able to walk again but is encouraged by the improved leg function he has seen over past week.  He was moving his legs around on bed today during visit.  The patient denies any significant depression but  does have worry.  Memory issues today were more about retrieval issues with names primarily.  He had good recall of recent events but name recall was an issues.  Patient reports that he does not see much help with Aricept. Patient and daughter deny any appearance of further decline over past 6 months in recall or other cognitive functions.  These deficits do not appear consistent with Alzheimer's Dementia.         Behavioral Observation: KHAMBREL AMSDEN  presents as a 72 y.o.-year-old Right Caucasian Male who appeared his stated age. his dress was Appropriate and he was Well Groomed and his manners were Appropriate to the situation.  his participation was indicative of Appropriate and Attentive behaviors.  There were physical disabilities noted.  he displayed an appropriate level of cooperation and motivation.     Interactions:    Active Appropriate and Attentive  Attention:   within normal limits and attention span and concentration were age appropriate  Memory:   abnormal; Patient reports 3-4 year changes in new learning and mild word finding issues.    Visuo-spatial:  not examined  Speech (Volume):  normal  Speech:   normal; some very mild word retrieval issues noted today.  Thought Process:  Coherent and Relevant  Though Content:  WNL; not suicidal and not homicidal  Orientation:   person, place, time/date and situation  Judgment:   Good  Planning:   Good  Affect:    Appropriate  Mood:    Dysphoric  Insight:   Good  Intelligence:   very high  Medical History:   Past Medical History:  Diagnosis Date  . Allergic rhinitis   . Arthritis   . Asthma    as a child  . B12 deficiency   . Cancer [facility])    prostate  . Cervicogenic headache 01/28/2016  . Chronic back pain   . Coronary atherosclerosis of native coronary artery    Mild nonobstructive CAD at catheterization January 2015  . Depression   . Essential hypertension, benign   . Falls   . GERD (gastroesophageal  reflux disease)   . History of cerebrovascular disease 07/23/2015  . History of pneumonia 02/2011  . Hyperlipidemia   . Kidney stone   . Memory difficulty 07/23/2015  . OSA (obstructive sleep apnea)    CPAP - Dr. [name]  . Prostate cancer ([place])   . PTSD (post-traumatic stress disorder)    [place]  . Rectal bleeding    Psychiatric History:  Prior history of PTSD from combat military experiences.  Depression and pain past symptoms.  Family Med/Psych History:  Family History  Problem Relation Age of Onset  . Emphysema Father   . Heart failure Father   . Lung cancer Father   . CAD Father   . Colon cancer Mother   . Stroke Mother   . Breast cancer Mother   . Stroke Sister   . Heart attack Brother   . Dementia Paternal Uncle   . Emphysema Maternal Grandmother   . Stroke Maternal Grandmother   . Asthma Other        grandson  . Heart disease Paternal Grandfather   . Anesthesia problems Neg Hx   . Hypotension Neg Hx   . Malignant hyperthermia  Neg Hx   . Pseudochol deficiency Neg Hx     Risk of Suicide/Violence: low Patient denies SI or HI  Impression/DX:  ROBY DONAWAY is a 72 year old male with history of hypertension, memory deficits, prostate cancer, PTSD, chronic back pain with multiple lumbar surgeries and spinal cord stimulator placement in January 2019.  Presented on 03/16/2018 after fall as well as hypoxemia and paraparesis.  X-rays revealed osteomyelitis T10-T11 with epidural abscess.  Bacteremia due to MSSA.  Laminectomy and decompression of epidural abscess debridement of discitis with segmental fixation per Dr. Ellene Route.  Patient has prior history of PTSD/Depression as well as reports of decline in memory over past 4 years.  Patient reports that Dr. Jannifer Franklin did a trial of Aricept and Namenda but patient does not fell this has helped much.  He does report that he feels his memory has improved a little as his medical status has improved.  STM and episodic memory remain mildly  impaired.  Mental Status is good.  The patient denies any worsening of Chronic PTSD issues and in fact reports that they have been better with so many things to think about and do.  The patient reports more worry about not being able to walk again but is encouraged by the improved leg function he has seen over past week.  He was moving his legs around on bed today during visit.  The patient denies any significant depression but does have worry.  Memory issues today were more about retrieval issues with names primarily.  He had good recall of recent events but name recall was an issues.  Patient reports that he does not see much help with Aricept. Patient and daughter deny any appearance of further decline over past 6 months in recall or other cognitive functions.  These deficits do not appear consistent with Alzheimer's Dementia.   Diagnosis:    Paraplegia     Chronic PTSD        Electronically Signed   _______________________ Ilean Skill, Psy.D.

## 2018-04-26 NOTE — Progress Notes (Signed)
Physical Therapy Note  Patient Details  Name: Cody Velez MRN: [id number] Date of Birth: [birth year] Today's Date: 04/26/2018  1300-1415, 75 min individual tx Pain: 6/10 low back;predicated  [name] present; PT educated her and pt on exs below, as well as self stretching in sitting in w/c, using foot stool and strap for hamstrings and heel cords .   Therapeutic exercises performed with LE to increase strength for functional mobility: supine- 15 x 1 bil hip internal rotation, lower trunk rotation 10 x 1 each bil bridging,  10 x 2 cervical flexion; L side lying- 10 x 1 R clam shells for hip abductors. They would benefit from hand-outs in order to do these outside of tx.   Bed mobility training in flat bed wihtout use of railings for rolling L><R after bil LE set up in hook lying, x 5 each, focusing on avoiding trunk rotation.  Toney donned brace with pt in sitting, minimal cues.  PT donned socks and shoes.  W/c propulsion for activity tolerance, on level tile, to/from room to gym.  Sit> stand in //, L hand pulling up on bar, R hand pushing on armrest.  Wt shifting in standing L><R, focusing on knee extension.  Pt has minimal volitional knee extension, and over-shifts to L with poor awareness.  Pt reported L knee medial pain due to pre-existing L knee OA.   Pt and Toney returned to room.  She plans to bring pt's L knee brace in which he wore to control buckling, PTA.   Pt plans to stay up in w/c until next therapy session, with [name] in attendance.   See function navigator for current status.  Bluford Sedler 04/26/2018, 9:58 AM

## 2018-04-26 NOTE — Progress Notes (Signed)
Nutrition Follow-up  DOCUMENTATION CODES:   Obesity unspecified  INTERVENTION:  Continue Ensure Enlive po BID, each supplement provides 350 kcal and 20 grams of protein.  Encourage adequate PO intake.   NUTRITION DIAGNOSIS:   Inadequate oral intake related to poor appetite as evidenced by per patient/family report; improved  GOAL:   Patient will meet greater than or equal to 90% of their needs; met  MONITOR:   PO intake, Supplement acceptance, Labs, Weight trends, I & O's, Skin  REASON FOR ASSESSMENT:   Consult Poor PO  ASSESSMENT:   72 y.o. right-handed male with history of hypertension, memory deficits maintained on Aricept and Namenda, prostate cancer, PTSD, chronic back pain with multiple lumbar surgeries with recent spinal cord stimulator placement January 2019 as well as treatment for recent UTI.  Presented 03/16/2018 with lethargy/fall as well as hypoxemia with paraparesis.  X-rays and imaging revealed osteomyelitis T10-T11 with epidural abscess. Patient underwent laminectomy T10-T11 decompression of epidural abscess debridement of discitis T10-T11 with segmental fixation from T8-L3 03/28/2018.  Meal completion has been 50-100%. Pt currently has Ensure ordered and has been consuming them. RD to continue with current orders to provide adequate caloric and protein needs.   Diet Order:   Diet Order           Diet regular Room service appropriate? Yes; Fluid consistency: Thin  Diet effective now          EDUCATION NEEDS:   No education needs have been identified at this time  Skin:  Skin Assessment: Skin Integrity Issues: Skin Integrity Issues:: Stage II Stage II: coccyx  Last BM:  5/6  Height:   Ht Readings from Last 1 Encounters:  04/15/18 '6\' 3"'  (1.905 m)    Weight:   Wt Readings from Last 1 Encounters:  04/26/18 274 lb 11.1 oz (124.6 kg)    Ideal Body Weight:  89 kg  BMI:  Body mass index is 34.33 kg/m.  Estimated Nutritional Needs:   Kcal:   2150-2350  Protein:  105-120 grams  Fluid:  >/= 2.1 L/day    Corrin Parker, MS, RD, LDN Pager # [phone] After hours/ weekend pager # [phone]

## 2018-04-26 NOTE — Progress Notes (Signed)
Patient is currently on his home cpap and resting.

## 2018-04-26 NOTE — Progress Notes (Signed)
Subjective/Complaints: Had a better night. Rested. Pain under control. +constipation  ROS: Patient denies fever, rash, sore throat, blurred vision, nausea, vomiting, diarrhea, cough, shortness of breath or chest pain, joint or back pain, headache, or mood change.    Objective: Vital Signs: Blood pressure (!) 154/96, pulse 86, temperature 97.7 F (36.5 C), temperature source Oral, resp. rate 16, height 6\' 3"  (1.905 m), weight 124.6 kg (274 lb 11.1 oz), SpO2 99 %. Dg Chest 1 View  Result Date: 04/25/2018 CLINICAL DATA:  Possible malfunction of the right-sided PICC line. EXAM: CHEST  1 VIEW COMPARISON:  Chest x-ray dated March 17, 2018 FINDINGS: The right-sided PICC line tip terminates at the junction of the right and left brachiocephalic veins. The lungs are well-expanded. The interstitial markings are increased. There is no alveolar infiltrate or significant pleural effusion. The heart is normal in size. The pulmonary vascularity is not engorged. There is calcification in the wall of the thoracic aorta. The patient has undergone previous mid and distal thoracic spinal fusion. There are nerve stimulator electrodes present which terminate in the lower cervical region. IMPRESSION: COPD. Mild interstitial prominence may reflect pulmonary fibrosis or low-grade interstitial edema. Thoracic aortic atherosclerosis. Electronically Signed   By: David  Martinique M.D.   On: 04/25/2018 13:52   [place] Ekg Site Rite  Result Date: 04/25/2018 If Site Rite image not attached, placement could not be confirmed due to current cardiac rhythm.  Results for orders placed or performed during the hospital encounter of 03/30/18 (from the past 72 hour(s))  CBC     Status: Abnormal   Collection Time: 04/24/18  9:45 AM  Result Value Ref Range   WBC 7.5 4.0 - 10.5 K/uL   RBC 3.93 (L) 4.22 - 5.81 MIL/uL   Hemoglobin 9.7 (L) 13.0 - 17.0 g/dL   HCT 33.7 (L) 39.0 - 52.0 %   MCV 85.8 78.0 - 100.0 fL   MCH 24.7 (L) 26.0 - 34.0 pg    MCHC 28.8 (L) 30.0 - 36.0 g/dL   RDW 16.8 (H) 11.5 - 15.5 %   Platelets 724 (H) 150 - 400 K/uL    Comment: Performed at [facility], [address], [place], [place] [address]     Constitutional: No distress . Vital signs reviewed. HEENT: EOMI, oral membranes moist Neck: supple Cardiovascular: RRR without murmur. No JVD    Respiratory: CTA Bilaterally without wheezes or rales. Normal effort    GI: BS +, non-tender, non-distended  Musc: No edema or tenderness in extremities. Neuro: Alert and Oriented.  Motor: 5/5 in BUE proximal to distal RLE: hip flexion 3+/5, knee extension 3+/5, ankle dorsiflexion/plantarflexion 4/5   LLE: Hip flexion   3-/5, KE 3/5 ankle dorsiflexion/PF 3+/5 (no changes today) Skin: PICC replaced. Dressing intact     Assessment/Plan: 1. Functional deficits secondary to paraplegia secondary to epidural abscess which require 3+ hours per day of interdisciplinary therapy in a comprehensive inpatient rehab setting. Physiatrist is providing close team supervision and 24 hour management of active medical problems listed below. Physiatrist and rehab team continue to assess barriers to discharge/monitor patient progress toward functional and medical goals. FIM: Function - Bathing Bathing activity did not occur: Refused Position: Shower Body parts bathed by patient: Right arm, Left arm, Chest, Abdomen, Front perineal area, Buttocks, Right upper leg, Left upper leg, Right lower leg, Left lower leg, Back Body parts bathed by helper: Right lower leg, Left lower leg, Back Bathing not applicable: Buttocks, Right upper leg, Left upper leg,  Right lower leg, Left lower leg, Back Assist Level: Assistive device, Touching or steadying assistance(Pt > 75%) Assistive Device Comment: LH sponge Set up : To obtain items  Function- Upper Body Dressing/Undressing Upper body dressing/undressing activity did not occur: Refused What is the patient wearing?: Pull over shirt/dress,  Orthosis Pull over shirt/dress - Perfomed by patient: Put head through opening, Thread/unthread left sleeve, Thread/unthread right sleeve, Pull shirt over trunk Pull over shirt/dress - Perfomed by helper: Pull shirt over trunk Orthosis activity level: Performed by helper Assist Level: Supervision or verbal cues Function - Lower Body Dressing/Undressing What is the patient wearing?: Socks, Shoes Position: Sitting EOB Underwear - Performed by patient: Thread/unthread right underwear leg, Thread/unthread left underwear leg Underwear - Performed by helper: Thread/unthread left underwear leg, Thread/unthread right underwear leg Pants- Performed by patient: Thread/unthread right pants leg, Thread/unthread left pants leg Pants- Performed by helper: Pull pants up/down Non-skid slipper socks- Performed by patient: Don/doff right sock, Don/doff left sock Non-skid slipper socks- Performed by helper: Don/doff right sock, Don/doff left sock Socks - Performed by patient: Don/doff right sock, Don/doff left sock(with sock aide) Socks - Performed by helper: Don/doff right sock, Don/doff left sock Shoes - Performed by patient: Fasten right, Fasten left Shoes - Performed by helper: Don/doff right shoe, Don/doff left shoe, Fasten right, Fasten left Assist for footwear: Partial/moderate assist Assist for lower body dressing: Assistive device(sit<stand in [place]) Assistive Device Comment: Reacher + sock aide  Function - Toileting Toileting activity did not occur: No continent bowel/bladder event Toileting steps completed by patient: Adjust clothing after toileting Toileting steps completed by helper: Adjust clothing prior to toileting, Adjust clothing after toileting Assist level: Touching or steadying assistance (Pt.75%)  Function - Air cabin crew transfer activity did not occur: N/A Toilet transfer assistive device: Mechanical lift, Elevated toilet seat/BSC over toilet Mechanical lift:  Stedy Assist level to toilet: Maximal assist (Pt 25 - 49%/lift and lower) Assist level from toilet: Maximal assist (Pt 25 - 49%/lift and lower)  Function - Chair/bed transfer Chair/bed transfer activity did not occur: N/A Chair/bed transfer method: Lateral scoot Chair/bed transfer assist level: Supervision or verbal cues Chair/bed transfer assistive device: Sliding board, Orthosis, Armrests Mechanical lift: Stedy Chair/bed transfer details: Verbal cues for precautions/safety, Verbal cues for safe use of DME/AE  Function - Locomotion: Wheelchair Will patient use wheelchair at discharge?: Yes Type: Manual Wheelchair activity did not occur: N/A Max wheelchair distance: 120' Assist Level: No help, No cues, assistive device, takes more than reasonable amount of time Wheel 50 feet with 2 turns activity did not occur: N/A Assist Level: No help, No cues, assistive device, takes more than reasonable amount of time Wheel 150 feet activity did not occur: N/A Assist Level: No help, No cues, assistive device, takes more than reasonable amount of time Turns around,maneuvers to table,bed, and toilet,negotiates 3% grade,maneuvers on rugs and over doorsills: No Function - Locomotion: Ambulation Ambulation activity did not occur: N/A Assistive device: Parallel bars, Maxi sky Max distance: 10' Assist level: Maximal assist (Pt 25 - 49%) Walk 10 feet activity did not occur: N/A Assist level: Maximal assist (Pt 25 - 49%)(with support of maxi sky) Walk 50 feet with 2 turns activity did not occur: N/A Walk 150 feet activity did not occur: N/A Walk 10 feet on uneven surfaces activity did not occur: N/A  Function - Comprehension Comprehension: Auditory Comprehension assist level: Follows complex conversation/direction with no assist  Function - Expression Expression: Verbal Expression assistive device: Communication board Expression assist level:  Expresses complex ideas: With no assist  Function -  Social Interaction Social Interaction assist level: Interacts appropriately with others with medication or extra time (anti-anxiety, antidepressant).  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 Back brace when out of bed. Patient with history of multiple lumbar surgeries as well as spinal cord stimulator January 2019   Continue CIR 2. DVT left soleal, right peroneal DVT's:.     prophylactic doses of Lovenox started on 4/12.    Repeat dopplers with ongoing DVT's in peroneal (both?) and left soleal    -will need to discuss treatment dose rx. ?xarelto 3. Pain Management:Neurontin 600 mg 3 times daily, Robaxin   as needed   Lidoderm patch     -scheduled tylenol  -oxycodone prn.  -ROM/improved technique to reduce stress on left shoulder 4. Mood:Aricept 10 mg nightly, Namenda 10 mg twice daily, Effexor 150 mg twice daily, trazodone 100 mg nightly  -STM deficits are notable  -provide regular positive reinforcement re: prognosis  5. Neuropsych: This patientisfully capable of making decisions on hisown behalf. 6. Skin/Wound Care:staples out per NS 7. Fluids/Electrolytes/Nutrition:Routine I/O's  -supplementing potassium KCl 20 mEq/day.    Potassium 3.9 on 4/30   8.ID/MSSA bacteremia.Intravenous Ancef 2 g every 8 hours through 05/22/2018 and stop as well as rifampin 300 mg twice daily.    -outpt Follow-up per infectious disease 9.Acute on chronic anemia.     Hemoglobin  9.0 on 4/30, repeat 9.7  5/6  -hemorrhoid cream prn  10.Hypertension. Norvasc 10 mg daily   Vitals:   04/25/18 1957 04/26/18 0623  BP: (!) 144/94 (!) 154/96  Pulse: 90 86  Resp: 18 16  Temp: 98 F (36.7 C) 97.7 F (36.5 C)  SpO2: 99% 99%    Borderline/elevated DBP. Introduce prinivil 5mg  daily   (on losartan PTA) 11.Hyperlipidemia. Pravachol 12.History of prostate cancer. Proscar 5 mg daily 13.Constipation.     -results with linzess  -resume senna-s  -sorbitol today  -fleet enema prn 14. Urine retention, occasional incontinence seems to be improved  ---Ua negative, ucx negative 15. Morbid obesity   Body mass index is 34.33 kg/m.  Diet and exercise education  Encourage weight loss to increase endurance and promote overall health 16. Sleep disturbance  Melatonin added to trazodone on 5/5 with results.   LOS (Days) [place] EVALUATION WAS PERFORMED  [name] 04/26/2018, 8:28 AM

## 2018-04-26 NOTE — Patient Care Conference (Signed)
Inpatient RehabilitationTeam Conference and Plan of Care Update Date: 04/25/2018   Time: 2:00 PM    Patient Name: Cody Velez      Medical Record Number: [id number]  Date of Birth: 1946-10-25 Sex: Male         Room/Bed: 4W06C/4W06C-01 Payor Info: Payor: [place] / Plan: [employer] PPO / Product Type: *No Product type* /    Admitting Diagnosis: T 10  11 Myelopathy  Admit Date/Time:  03/30/2018  3:13 PM Admission Comments: No comment available   Primary Diagnosis:  Paraplegia ([place]) Principal Problem: Paraplegia [facility])  Patient Active Problem List   Diagnosis Date Noted  . Sleep disturbance   . Post-op pain   . Hypokalemia   . Morbid obesity ([place])   . Postoperative pain   . DVT, lower extremity, distal, acute, bilateral ([place])   . Acute blood loss anemia   . Anemia of chronic disease   . Essential hypertension   . Bacteremia   . Myelopathy ([place]) 03/30/2018  . Paraplegia ([place])   . Postlaminectomy syndrome, lumbar region   . Epidural abscess   . Abdominal distension   . Encephalopathy   . Weakness of both lower extremities   . Discitis of thoracic region   . Spinal cord stimulator status   . Staphylococcus aureus sepsis ([place]) 03/20/2018  . Bacteremia due to methicillin susceptible Staphylococcus aureus (MSSA) 03/18/2018  . Obesity 03/18/2018  . Weakness 03/16/2018  . Fever 03/16/2018  . Sepsis ([place]) 03/16/2018  . Nephrolithiasis 03/16/2018  . AKI (acute kidney injury) ([place]) 03/16/2018  . Acute metabolic encephalopathy 32/67/1245  . Cognitive impairment 03/16/2018  . Chronic pain syndrome 03/16/2018  . B12 deficiency 01/28/2016  . Memory difficulty 07/23/2015  . History of cerebrovascular disease 07/23/2015  . FH: colon cancer 08/13/2014  . Hx of adenomatous colonic polyps 08/13/2014  . Chronic back pain 05/30/2012  . CAD, NATIVE VESSEL 07/17/2010  . HYPERCHOLESTEROLEMIA 10/31/2009  . SMOKELESS TOBACCO ABUSE 10/31/2009  . Obstructive sleep apnea  10/31/2009  . Essential hypertension, benign 10/31/2009  . GERD 10/31/2009    Expected Discharge Date: Expected Discharge Date: 05/06/18  Team Members Present: Physician leading conference: Dr. Alger Simons Social Worker Present: Lennart Pall, LCSW Nurse Present: Other (comment)(Cristal Richardson Landry, RN) PT Present: Dwyane Dee, PT OT Present: Roanna Epley, Tibbie, OT PPS Coordinator present : Daiva Nakayama, RN, CRRN     Current Status/Progress Goal Weekly Team Focus  Medical   slow progress. pt with STM deficits. pain controlled. labs improving,  improve functional use of legs  rx constipation, bp control, ego support   Bowel/Bladder   continent of bowel and bladder LBM 5-6  Remain continent of bowel and bladder  Assist with tolieting needs prn   Swallow/Nutrition/ Hydration             ADL's   bathing-min A with AE; LB dsge-mod A with AE; sit<>stand with RW-min A; lateral scoot transfers level surface-supervision; fatigues quickly; standing balance-30 seconds with min A  min A overall  functional tranfsers, standing balance, activity tolerance   Mobility   slide board transfers, NMR for BLEs, standing tolerance  will upgrade w/c transfers to supervision, remaining goals stay the same (mod I w/c, mod assist standing)  standing tolerance/balance for NMR, focus on transfers and self direction of care, recall, d/c planning for home set up   Communication             Safety/Cognition/ Behavioral Observations  Pain   chronic back pain sch robaxin prn oxyocode  pain < or =3  Assess pain q shift and prn    Skin   surgical incision to midline cack with steri strips, healed stage 2          Rehab Goals Patient on target to meet rehab goals: Yes *See Care Plan and progress notes for long and short-term goals.     Barriers to Discharge  Current Status/Progress Possible Resolutions Date Resolved   Physician    Medical stability;Weight        ongoing  medical mgt per progress note      Nursing                  PT                    OT                  SLP                SW                Discharge Planning/Teaching Needs:  Pt to d/c home with girlfriend who can provide light physical assistance.  Daughter are available for intermittent support.  Teaching to be planned closer to d/c.   Team Discussion:  Pt starting to express some depression over his situation - neuropsychology to see tomorrow.  Supervision to set up for transfers via board; standing just for muscle re-ed.  Bathing with min assist and LB dsg at bed level.  Continues to make progress.  Revisions to Treatment Plan:  None    Continued Need for Acute Rehabilitation Level of Care: The patient requires daily medical management by a physician with specialized training in physical medicine and rehabilitation for the following conditions: Daily direction of a multidisciplinary physical rehabilitation program to ensure safe treatment while eliciting the highest outcome that is of practical value to the patient.: Yes Daily medical management of patient stability for increased activity during participation in an intensive rehabilitation regime.: Yes Daily analysis of laboratory values and/or radiology reports with any subsequent need for medication adjustment of medical intervention for : Neurological problems;Post surgical problems  Kamiah Fite 04/26/2018, 1:24 PM

## 2018-04-26 NOTE — Progress Notes (Signed)
Physical Therapy Session Note  Patient Details  Name: JEVAUN STRICK MRN: [id number] Date of Birth: 22-May-1946  Today's Date: 04/26/2018 PT Individual Time: 1515-1630 PT Individual Time Calculation (min): 75 min   Short Term Goals: Week 4:  PT Short Term Goal 1 (Week 4): Pt will tolerate 30 seconds of static stance with BUE for caregiver to assist with LB dressing/ADLs.  PT Short Term Goal 2 (Week 4): Pt will complete slide board transfer with assist for board placement and supervision for transfer PT Short Term Goal 3 (Week 4): Pt will recall 2 methods of pressure relief while in w/c with min cues PT Short Term Goal 4 (Week 4): Pt will be able to direct caregiver for donning TLSO with min question cues.   Skilled Therapeutic Interventions/Progress Updates:    no c/o pain but reports fatigue.  Session focus on w/c mobility for UE strengthening and cardiovascular endurance, transfers, LE strengthening, and car transfers.  Pt able to propel w/c throughout unit mod I.  Transfers to and from w/c in therapy gym with close supervision, verbal cues for watching feet for placement.  PT instructed pt in 2x10 reps hamstring pull with green theraband for NMR and strengthening.  Car transfer to simulated [place] height with slide board.  Pt requires max assist to enter and mod to exit car. Discussed practicing with real car for improved problem solving.  Extensive education on safety concerns with stand/pivot versus lateral scoot transfers.  Pt returned to room at end of session and positioned upright in w/c with significant other present, awaiting SW.    Therapy Documentation Precautions:  Precautions Precautions: Fall, Back Precaution Booklet Issued: No Precaution Comments: Pt needed min cues to recall 3/3 back precautions Required Braces or Orthoses: Spinal Brace Spinal Brace: Thoracolumbosacral orthotic, Applied in sitting position Restrictions Weight Bearing Restrictions: No   See  Function Navigator for Current Functional Status.   Therapy/Group: Individual Therapy  Michel Santee 04/26/2018, 7:40 PM

## 2018-04-26 NOTE — Progress Notes (Signed)
Occupational Therapy Session Note  Patient Details  Name: Cody Velez MRN: [id number] Date of Birth: 30-Mar-1946  Today's Date: 04/26/2018 OT Individual Time: 4196-2229 OT Individual Time Calculation (min): 90 min    Short Term Goals: Week 4:  OT Short Term Goal 1 (Week 4): Pt will pull pants up><down with mod A  OT Short Term Goal 2 (Week 4): Pt will tolerate standing for 1 minute for grooming at sink with min A  OT Short Term Goal 3 (Week 4): Pt will transfer to [facility] with min A   Skilled Therapeutic Interventions/Progress Updates:    Pt resting in bed upon arrival.  OT intervention with focus on bathing/dressing bed level and sit<>stand EOB.  Pt completed LB bathing tasks at bed level before sitting EOB (supervision) to don shirt, underpants, pants, socks, and shoes.  Pt dons shirt at supervision level.  Pt uses AE for LB dressing and requires assistance donning shoes and pulling up pants/underpants.  Pt performed sit<>stand X 4 for approx 15 seconds each occurrence.  Pt incontinent of bowel while urinating and required tot A for hygiene. Pt performed lateral scoot transfer to w/c at supervision level.  OT intervention with focus on w/c mobility and home safety.  Pt remained in w/c with all needs within reach.   Therapy Documentation Precautions:  Precautions Precautions: Fall, Back Precaution Booklet Issued: No Precaution Comments: Pt needed min cues to recall 3/3 back precautions Required Braces or Orthoses: Spinal Brace Spinal Brace: Thoracolumbosacral orthotic, Applied in sitting position Restrictions Weight Bearing Restrictions: No Pain:  Pt c/o 5/10 pain in back, repositioned and RN aware  See Function Navigator for Current Functional Status.   Therapy/Group: Individual Therapy  [name] 04/26/2018, 3:16 PM

## 2018-04-27 ENCOUNTER — Inpatient Hospital Stay (HOSPITAL_COMMUNITY): Payer: Medicare Other | Admitting: Physical Therapy

## 2018-04-27 ENCOUNTER — Inpatient Hospital Stay (HOSPITAL_COMMUNITY): Payer: Medicare Other | Admitting: Occupational Therapy

## 2018-04-27 ENCOUNTER — Inpatient Hospital Stay (HOSPITAL_COMMUNITY): Payer: Medicare Other

## 2018-04-27 LAB — CBC
HCT: 33.1 % — ABNORMAL LOW (ref 39.0–52.0)
Hemoglobin: 9.5 g/dL — ABNORMAL LOW (ref 13.0–17.0)
MCH: 24.4 pg — ABNORMAL LOW (ref 26.0–34.0)
MCHC: 28.7 g/dL — ABNORMAL LOW (ref 30.0–36.0)
MCV: 85.1 fL (ref 78.0–100.0)
Platelets: 675 10*3/uL — ABNORMAL HIGH (ref 150–400)
RBC: 3.89 MIL/uL — ABNORMAL LOW (ref 4.22–5.81)
RDW: 16.9 % — ABNORMAL HIGH (ref 11.5–15.5)
WBC: 9.2 10*3/uL (ref 4.0–10.5)

## 2018-04-27 LAB — PROTIME-INR
INR: 1.14
Prothrombin Time: 14.5 seconds (ref 11.4–15.2)

## 2018-04-27 MED ORDER — WARFARIN - PHARMACIST DOSING INPATIENT
Freq: Every day | Status: DC
  Administered 2018-04-28: 18:00:00

## 2018-04-27 MED ORDER — ENOXAPARIN SODIUM 120 MG/0.8ML ~~LOC~~ SOLN
120.0000 mg | Freq: Two times a day (BID) | SUBCUTANEOUS | Status: DC
Start: 2018-04-27 — End: 2018-04-29
  Administered 2018-04-27 – 2018-04-28 (×3): 120 mg via SUBCUTANEOUS
  Filled 2018-04-27 (×4): qty 0.8

## 2018-04-27 MED ORDER — WARFARIN SODIUM 7.5 MG PO TABS
7.5000 mg | ORAL_TABLET | Freq: Once | ORAL | Status: AC
  Administered 2018-04-27: 7.5 mg via ORAL
  Filled 2018-04-27: qty 1

## 2018-04-27 MED ORDER — RIVAROXABAN 15 MG PO TABS: 15.0000 mg | ORAL_TABLET | Freq: Two times a day (BID) | ORAL | Status: DC

## 2018-04-27 NOTE — Progress Notes (Signed)
Subjective/Complaints: Had a better night. Rested. Pain under control. +constipation  ROS: Patient denies fever, rash, sore throat, blurred vision, nausea, vomiting, diarrhea, cough, shortness of breath or chest pain, joint or back pain, headache, or mood change.    Objective: Vital Signs: Blood pressure 123/77, pulse 79, temperature 98.6 F (37 C), temperature source Oral, resp. rate 18, height 6\' 3"  (1.905 m), weight 126.7 kg (279 lb 5.2 oz), SpO2 96 %. Dg Chest 1 View  Result Date: 04/25/2018 CLINICAL DATA:  Possible malfunction of the right-sided PICC line. EXAM: CHEST  1 VIEW COMPARISON:  Chest x-ray dated March 17, 2018 FINDINGS: The right-sided PICC line tip terminates at the junction of the right and left brachiocephalic veins. The lungs are well-expanded. The interstitial markings are increased. There is no alveolar infiltrate or significant pleural effusion. The heart is normal in size. The pulmonary vascularity is not engorged. There is calcification in the wall of the thoracic aorta. The patient has undergone previous mid and distal thoracic spinal fusion. There are nerve stimulator electrodes present which terminate in the lower cervical region. IMPRESSION: COPD. Mild interstitial prominence may reflect pulmonary fibrosis or low-grade interstitial edema. Thoracic aortic atherosclerosis. Electronically Signed   By: David  Martinique M.D.   On: 04/25/2018 13:52   [place] Ekg Site Rite  Result Date: 04/25/2018 If Site Rite image not attached, placement could not be confirmed due to current cardiac rhythm.  No results found for this or any previous visit (from the past 72 hour(s)).   Constitutional: No distress . Vital signs reviewed. HEENT: EOMI, oral membranes moist Neck: supple Cardiovascular: RRR without murmur. No JVD    Respiratory: CTA Bilaterally without wheezes or rales. Normal effort    GI: BS +, non-tender, non-distended  Musc: No edema or tenderness in extremities. Neuro: Alert  and Oriented.  Motor: 5/5 in BUE proximal to distal RLE: hip flexion 3+/5, knee extension 3+/5, ankle dorsiflexion/plantarflexion 4/5   LLE: Hip flexion   3-/5, KE 3/5 ankle dorsiflexion/PF 3+/5 (no changes today) Skin: PICC replaced. Dressing intact     Assessment/Plan: 1. Functional deficits secondary to paraplegia secondary to epidural abscess which require 3+ hours per day of interdisciplinary therapy in a comprehensive inpatient rehab setting. Physiatrist is providing close team supervision and 24 hour management of active medical problems listed below. Physiatrist and rehab team continue to assess barriers to discharge/monitor patient progress toward functional and medical goals. FIM: Function - Bathing Bathing activity did not occur: Refused Position: Shower Body parts bathed by patient: Right arm, Left arm, Chest, Abdomen, Front perineal area, Buttocks, Right upper leg, Left upper leg, Right lower leg, Left lower leg, Back Body parts bathed by helper: Right lower leg, Left lower leg, Back Bathing not applicable: Buttocks, Right upper leg, Left upper leg, Right lower leg, Left lower leg, Back Assist Level: Assistive device, Touching or steadying assistance(Pt > 75%) Assistive Device Comment: LH sponge Set up : To obtain items  Function- Upper Body Dressing/Undressing Upper body dressing/undressing activity did not occur: Refused What is the patient wearing?: Pull over shirt/dress, Orthosis Pull over shirt/dress - Perfomed by patient: Put head through opening, Thread/unthread left sleeve, Thread/unthread right sleeve, Pull shirt over trunk Pull over shirt/dress - Perfomed by helper: Pull shirt over trunk Orthosis activity level: Performed by helper Assist Level: Supervision or verbal cues Function - Lower Body Dressing/Undressing What is the patient wearing?: Socks, Shoes Position: Sitting EOB Underwear - Performed by patient: Thread/unthread right underwear leg, Thread/unthread  left underwear leg  Underwear - Performed by helper: Thread/unthread left underwear leg, Thread/unthread right underwear leg Pants- Performed by patient: Thread/unthread right pants leg, Thread/unthread left pants leg Pants- Performed by helper: Pull pants up/down Non-skid slipper socks- Performed by patient: Don/doff right sock, Don/doff left sock Non-skid slipper socks- Performed by helper: Don/doff right sock, Don/doff left sock Socks - Performed by patient: Don/doff right sock, Don/doff left sock(with sock aide) Socks - Performed by helper: Don/doff right sock, Don/doff left sock Shoes - Performed by patient: Fasten right, Fasten left Shoes - Performed by helper: Don/doff right shoe, Don/doff left shoe, Fasten right, Fasten left Assist for footwear: Partial/moderate assist Assist for lower body dressing: Assistive device(sit<stand in [place]) Assistive Device Comment: Reacher + sock aide  Function - Toileting Toileting activity did not occur: No continent bowel/bladder event Toileting steps completed by patient: Adjust clothing after toileting Toileting steps completed by helper: Adjust clothing prior to toileting, Adjust clothing after toileting Assist level: Touching or steadying assistance (Pt.75%)  Function - Air cabin crew transfer activity did not occur: N/A Toilet transfer assistive device: Mechanical lift, Elevated toilet seat/BSC over toilet Mechanical lift: Stedy Assist level to toilet: Maximal assist (Pt 25 - 49%/lift and lower) Assist level from toilet: Maximal assist (Pt 25 - 49%/lift and lower)  Function - Chair/bed transfer Chair/bed transfer activity did not occur: N/A Chair/bed transfer method: Lateral scoot Chair/bed transfer assist level: Supervision or verbal cues Chair/bed transfer assistive device: Orthosis, Armrests Mechanical lift: Stedy Chair/bed transfer details: Verbal cues for precautions/safety, Verbal cues for safe use of DME/AE  Function -  Locomotion: Wheelchair Will patient use wheelchair at discharge?: Yes Type: Manual Wheelchair activity did not occur: N/A Max wheelchair distance: 180 Assist Level: No help, No cues, assistive device, takes more than reasonable amount of time Wheel 50 feet with 2 turns activity did not occur: N/A Assist Level: No help, No cues, assistive device, takes more than reasonable amount of time Wheel 150 feet activity did not occur: N/A Assist Level: No help, No cues, assistive device, takes more than reasonable amount of time Turns around,maneuvers to table,bed, and toilet,negotiates 3% grade,maneuvers on rugs and over doorsills: No Function - Locomotion: Ambulation Ambulation activity did not occur: N/A Assistive device: Parallel bars, Maxi sky Max distance: 10' Assist level: Maximal assist (Pt 25 - 49%) Walk 10 feet activity did not occur: N/A Assist level: Maximal assist (Pt 25 - 49%)(with support of maxi sky) Walk 50 feet with 2 turns activity did not occur: N/A Walk 150 feet activity did not occur: N/A Walk 10 feet on uneven surfaces activity did not occur: N/A  Function - Comprehension Comprehension: Auditory Comprehension assist level: Follows complex conversation/direction with no assist  Function - Expression Expression: Verbal Expression assistive device: Communication board Expression assist level: Expresses complex ideas: With no assist  Function - Social Interaction Social Interaction assist level: Interacts appropriately with others with medication or extra time (anti-anxiety, antidepressant).  Function - Problem Solving Problem solving assist level: Solves complex problems: Recognizes & self-corrects  Function - Memory Memory assist level: More than reasonable amount of time Patient normally able to recall (first 3 days only): Current season, Staff names and faces, That he or she is in a hospital, Location of own room  Medical Problem List and Plan: 1.Decreased  functional mobilitysecondary to T10-11 osteomyelitis/epidural abscess with subsequent myelopathy/paraplegia, status post decompression and fusion T8-L3. Back brace when out of bed. Patient with history of multiple lumbar surgeries as well as spinal cord stimulator January 2019  Continue CIR 2. DVT left soleal, right peroneal DVT's:.     prophylactic doses of Lovenox started on 4/12.    Repeat dopplers with ongoing DVT's in bilateral peroneal   and left soleal    -suspect he will need treatment of these given neurological status/mobility   -check with NS re: beginning xarelto 3. Pain Management:Neurontin 600 mg 3 times daily, Robaxin   as needed   Lidoderm patch     -scheduled tylenol  -oxycodone prn.  -ROM/improved technique to reduce stress on left shoulder 4. Mood:Aricept 10 mg nightly, Namenda 10 mg twice daily, Effexor 150 mg twice daily, trazodone 100 mg nightly  -STM deficits are notable  -providing regular positive reinforcement re: prognosis   -appreciative of neuropsych visit yesterday 5. Neuropsych: This patientisfully capable of making decisions on hisown behalf. 6. Skin/Wound Care:staples out per NS 7. Fluids/Electrolytes/Nutrition:Routine I/O's  -supplementing potassium KCl 20 mEq/day.    Potassium 3.9 on 4/30   8.ID/MSSA bacteremia.Intravenous Ancef 2 g every 8 hours through 05/22/2018 and stop as well as rifampin 300 mg twice daily.    -outpt Follow-up per infectious disease 9.Acute on chronic anemia.     Hemoglobin  9.0 on 4/30, repeat 9.7 5/6  -hemorrhoid cream prn  10.Hypertension. Norvasc 10 mg daily   Vitals:   04/26/18 2231 04/27/18 0245  BP: 110/71 123/77  Pulse: 85 79  Resp: 18 18  Temp: 97.6 F (36.4 C) 98.6 F (37 C)  SpO2: 95% 96%    Borderline/elevated DBP. Introduces prinivil 5mg  daily with some improvement   (on losartan PTA) 11.Hyperlipidemia. Pravachol 12.History of prostate cancer. Proscar 5 mg  daily 13.Constipation.     -improvement with linzess  -resumed senna-s  -sorbitol with results  -fleet enema prn  -stool red again, likely due to hemorrhoids 14. Urine retention, occasional incontinence seems to be improved  ---Ua negative, ucx negative 15. Morbid obesity   Body mass index is 34.91 kg/m.  Diet and exercise education  Encourage weight loss to increase endurance and promote overall health 16. Sleep disturbance  Melatonin added to trazodone on 5/5 with results.   LOS (Days) 28 A FACE TO FACE EVALUATION WAS PERFORMED  [name] 04/27/2018, 11:07 AM

## 2018-04-27 NOTE — Progress Notes (Signed)
Social Work Patient ID: Cody Velez, male   DOB: Jul 05, 1946, 72 y.o.   MRN: [id number]   Met with pt and girlfriend yesterday afternoon to review team conference.  Both pleased with progress, however, pt does admit that he was feeling depressed "a couple days ago.. It just started hitting me that I wouldn't walk out of here."  Notes he feels "better" and still very motivated for therapies.  Girlfriend remains very supportive and both aware we are still planning d/c 5/18.  Reviewed DME and HH vs OP follow up plans.  Will continue to follow.  Cody Thrun, LCSW

## 2018-04-27 NOTE — Progress Notes (Signed)
ANTICOAGULATION CONSULT NOTE - Initial Consult  Pharmacy Consult for Lovenox/coumadin Indication: DVT  No Known Allergies  Patient Measurements: Height: 6\' 3"  (190.5 cm) Weight: 279 lb 5.2 oz (126.7 kg) IBW/kg (Calculated) : 84.5   Vital Signs: Temp: 98.6 F (37 C) (05/09 0245) Temp Source: Oral (05/09 0245) BP: 123/77 (05/09 0245) Pulse Rate: 79 (05/09 0245)  Labs: No results for input(s): HGB, HCT, PLT, APTT, LABPROT, INR, HEPARINUNFRC, HEPRLOWMOCWT, CREATININE, CKTOTAL, CKMB, TROPONINI in the last 72 hours.  Estimated Creatinine Clearance: 121.5 mL/min (by C-G formula based on SCr of 0.76 mg/dL).  Assessment: 45 YOM transferred from [facility] to inpatient rehab on 4/11 with sepsis/MSSA bacteremia and hx of spinal nerve stimulator placement earlier this year. Plan for plan for continue ancef and PO rifampin through 6/3 per ID recommendation.  On 4/12 LE doppler was positive for DVTs in R peroneal vein & L soleal vein. Pt has been on lovenox for VTE prophylaxis since then.  Repeat LE doppler on 5/6 still positive for B/L acute DVT. Plan to start full treatment dose anticoagulation. Initial plan was to start xarelto. However, pt is currently on rifampin a strong Pgp and CYP enzyme induce will largely decrease xarelto level and effectiveness. Other DOACs will also be affected by this drug interaction. After discussing with Cody Velez, rehab PA, plan to start lovenox bridge to Coumadin for now. Pt need to be on rifampin through 6/3, anticipate he will require high coumadin dose initially. After rifampin is stopped, he will need close anticoagulation follow up, anticipate dramatic coumadin dose decrease a week after rifampin is stopped. Pt will likely be discharged next week. Will try to get INR therapeutic prior to discharge.  No recent PT/INR, crcl > 100 ml/min hgb 9.7, pltc 724K  Goal of Therapy:  INR 2-3 Anti-Xa level 0.6-1 units/ml 4hrs after LMWH dose given Monitor platelets by  anticoagulation protocol: Yes   Plan:  Lovenox 120 mg sq Q12 hrs PT/INR STAT and daily in AM Coumadin 7.5mg  PO x 1 tonight CBC/BMET in AM  Cody Velez, PharmD, BCPS  Clinical Pharmacist  Pager: [phone]   04/27/2018,2:34 PM

## 2018-04-27 NOTE — Discharge Instructions (Addendum)
Inpatient Rehab Discharge Instructions  Cody Velez Discharge date and time: No discharge date for patient encounter.   Activities/Precautions/ Functional Status: Activity: Back brace when out of bed Diet: regular diet Wound Care: none needed Functional status:  ___ No restrictions     ___ Walk up steps independently ___ 24/7 supervision/assistance   ___ Walk up steps with assistance ___ Intermittent supervision/assistance  ___ Bathe/dress independently ___ Walk with walker     _x__ Bathe/dress with assistance ___ Walk Independently    ___ Shower independently ___ Walk with assistance    ___ Shower with assistance ___ No alcohol     ___ Return to work/school ________    COMMUNITY REFERRALS UPON DISCHARGE:    Home Health:   PT    OT   RN                     Agency:  [facility] Phone: [phone]   Medical Equipment/Items Ordered: wheelchair, cushion, commode                                                     Agency/Supplier:  [facility] [phone]      Special Instructions: No driving  Continue intravenous Ancef 2 mg every 8 hours as well as RIFADIN 300 mg every 12 hours through 05/22/2018 and stop  Home health nurse to check INR 04/09/2018 results to Dr. Asencion Noble phone number [phone] fax number [phone] My questions have been answered and I understand these instructions. I will adhere to these goals and the provided educational materials after my discharge from the hospital.  Patient/Caregiver Signature _______________________________ Date __________  Clinician Signature _______________________________________ Date __________  Please bring this form and your medication list with you to all your follow-up doctor's appointments.    Information on my medicine - Coumadin   (Warfarin)  Please have close INR follow up the week after you complete antibiotics in June This medication education was reviewed with me or my healthcare representative as part  of my discharge preparation.   Why was Coumadin prescribed for you? Coumadin was prescribed for you because you have a blood clot or a medical condition that can cause an increased risk of forming blood clots. Blood clots can cause serious health problems by blocking the flow of blood to the heart, lung, or brain. Coumadin can prevent harmful blood clots from forming. As a reminder your indication for Coumadin is:   Deep Vein Thrombosis Treatment  What test will check on my response to Coumadin? While on Coumadin (warfarin) you will need to have an INR test regularly to ensure that your dose is keeping you in the desired range. The INR (international normalized ratio) number is calculated from the result of the laboratory test called prothrombin time (PT).  If an INR APPOINTMENT HAS NOT ALREADY BEEN MADE FOR YOU please schedule an appointment to have this lab work done by your health care provider within 7 days. Your INR goal is usually a number between:  2 to 3 or your provider may give you a more narrow range like 2-2.5.  Ask your health care provider during an office visit what your goal INR is.  What  do you need to  know  About  COUMADIN? Take Coumadin (warfarin) exactly as prescribed by your healthcare provider about the  same time each day.  DO NOT stop taking without talking to the doctor who prescribed the medication.  Stopping without other blood clot prevention medication to take the place of Coumadin may increase your risk of developing a new clot or stroke.  Get refills before you run out.  What do you do if you miss a dose? If you miss a dose, take it as soon as you remember on the same day then continue your regularly scheduled regimen the next day.  Do not take two doses of Coumadin at the same time.  Important Safety Information A possible side effect of Coumadin (Warfarin) is an increased risk of bleeding. You should call your healthcare provider right away if you experience any of  the following: ? Bleeding from an injury or your nose that does not stop. ? Unusual colored urine (red or dark brown) or unusual colored stools (red or black). ? Unusual bruising for unknown reasons. ? A serious fall or if you hit your head (even if there is no bleeding).  Some foods or medicines interact with Coumadin (warfarin) and might alter your response to warfarin. To help avoid this: ? Eat a balanced diet, maintaining a consistent amount of Vitamin K. ? Notify your provider about major diet changes you plan to make. ? Avoid alcohol or limit your intake to 1 drink for women and 2 drinks for men per day. (1 drink is 5 oz. wine, 12 oz. beer, or 1.5 oz. liquor.)  Make sure that ANY health care provider who prescribes medication for you knows that you are taking Coumadin (warfarin).  Also make sure the healthcare provider who is monitoring your Coumadin knows when you have started a new medication including herbals and non-prescription products.  Coumadin (Warfarin)  Major Drug Interactions  Increased Warfarin Effect Decreased Warfarin Effect  Alcohol (large quantities) Antibiotics (esp. Septra/Bactrim, Flagyl, Cipro) Amiodarone (Cordarone) Aspirin (ASA) Cimetidine (Tagamet) Megestrol (Megace) NSAIDs (ibuprofen, naproxen, etc.) Piroxicam (Feldene) Propafenone (Rythmol SR) Propranolol (Inderal) Isoniazid (INH) Posaconazole (Noxafil) Barbiturates (Phenobarbital) Carbamazepine (Tegretol) Chlordiazepoxide (Librium) Cholestyramine (Questran) Griseofulvin Oral Contraceptives Rifampin Sucralfate (Carafate) Vitamin K   Coumadin (Warfarin) Major Herbal Interactions  Increased Warfarin Effect Decreased Warfarin Effect  Garlic Ginseng Ginkgo biloba Coenzyme Q10 Green tea St. Johns wort    Coumadin (Warfarin) FOOD Interactions  Eat a consistent number of servings per week of foods HIGH in Vitamin K (1 serving =  cup)  Collards (cooked, or boiled & drained) Kale (cooked,  or boiled & drained) Mustard greens (cooked, or boiled & drained) Parsley *serving size only =  cup Spinach (cooked, or boiled & drained) Swiss chard (cooked, or boiled & drained) Turnip greens (cooked, or boiled & drained)  Eat a consistent number of servings per week of foods MEDIUM-HIGH in Vitamin K (1 serving = 1 cup)  Asparagus (cooked, or boiled & drained) Broccoli (cooked, boiled & drained, or raw & chopped) Brussel sprouts (cooked, or boiled & drained) *serving size only =  cup Lettuce, raw (green leaf, endive, romaine) Spinach, raw Turnip greens, raw & chopped   These websites have more information on Coumadin (warfarin):  FailFactory.se; VeganReport.com.au;

## 2018-04-27 NOTE — Progress Notes (Signed)
Patient cleared to begin Xarelto for treatment of DVT and discussed at length with neurosurgery Dr. Ellene Route

## 2018-04-27 NOTE — Progress Notes (Signed)
Pt home CPAP set up for night time use.  Water for humidity supplied to patient.  Wife at bedside to assist with placement tonight.  Pt will call if needs anything.

## 2018-04-27 NOTE — Progress Notes (Signed)
Occupational Therapy Session Note  Patient Details  Name: Cody Velez MRN: [id number] Date of Birth: 05-15-1946  Today's Date: 04/27/2018 OT Individual Time: 1330-1500 OT Individual Time Calculation (min): 90 min    Short Term Goals: Week 4:  OT Short Term Goal 1 (Week 4): Pt will pull pants up><down with mod A  OT Short Term Goal 2 (Week 4): Pt will tolerate standing for 1 minute for grooming at sink with min A  OT Short Term Goal 3 (Week 4): Pt will transfer to [facility] with min A   Skilled Therapeutic Interventions/Progress Updates:    Pt seen for OT session focusing on problem solving with ADLs and neuro re-ed with LE strengthening. Pt sitting up in w/c upon arrival with girlfriend present, pt agreeable to tx session. Discussed at length toileting task at d/c. Plans to use [facility] as w/c will not fit into bathroom. Discussed functional standing abilities for hygiene and clothing management, lateral leans, caregiver assist, energy conservation for transfers and clothing management.  In therapy gym, practiced lateral leans in simulation of clothing management and hygiene. Pt unable to reach buttock, provided with toilet aid and pt simulated use with success. He stated he believed this would work and is willing to try it. Discussed set-up of BSC in order to allow for most room for lateral leans, also discussed placement of grab bar along well to assist with sit>stand at [facility] as another possible option. Remainder of session completed with assist from PT clinical specialist. Completed sit>stand and partial sit>stand in maxisky for quad strengthening, pt very limited by fatigue requiring multiple rest breaks throughout session. Ambulated distance of parallel bars with heavy reliance on B UEs, one episode of L knee buckle and total collapse into maxisky sling. Mirror placed in front of pt for visual feedback of upright posture and with cues provided for erect posture and limiting UE reliance.  Scoot pivot  transfers completed throughout session with supervision. Pt's girlfriend able to correctly set-up w/c in prep for transfer independently. Pt left unit at end of session in w/c with girlfriend to go outside prior to next tx session.   Therapy Documentation Precautions:  Precautions Precautions: Fall, Back Precaution Booklet Issued: No Precaution Comments: Pt needed min cues to recall 3/3 back precautions Required Braces or Orthoses: Spinal Brace Spinal Brace: Thoracolumbosacral orthotic, Applied in sitting position Restrictions Weight Bearing Restrictions: No Pain: Pain Assessment Pain Scale: 0-10 Pain Score: 6  Pain Type: Acute pain Pain Location: Back Pain Descriptors / Indicators: Throbbing Pain Intervention(s): RN made aware, repositioned ADL: ADL ADL Comments: Please see functional navigator  See Function Navigator for Current Functional Status.   Therapy/Group: Individual Therapy  Sigmond Patalano L 04/27/2018, 7:26 AM

## 2018-04-27 NOTE — Progress Notes (Signed)
Physical Therapy Session Note  Patient Details  Name: Cody Velez MRN: [id number] Date of Birth: 1946/09/14  Today's Date: 04/27/2018 PT Individual Time: 1000-1030 PT Individual Time Calculation (min): 30 min  and Today's Date: 04/27/2018 PT Concurrent Time: 1030-1100 PT Concurrent Time Calculation (min): 30 min  Short Term Goals: Week 4:  PT Short Term Goal 1 (Week 4): Pt will tolerate 30 seconds of static stance with BUE for caregiver to assist with LB dressing/ADLs.  PT Short Term Goal 2 (Week 4): Pt will complete slide board transfer with assist for board placement and supervision for transfer PT Short Term Goal 3 (Week 4): Pt will recall 2 methods of pressure relief while in w/c with min cues PT Short Term Goal 4 (Week 4): Pt will be able to direct caregiver for donning TLSO with min question cues.   Skilled Therapeutic Interventions/Progress Updates:    no c/o pain at start of session, but does endorse some pain in back towards end of session, does not rate.  Pt partially incontinent of bowel on arrival, so initial part of session focus on toilet transfers, hygiene, and changing clothing, remaining session focus on LE therex for NMR and strengthening.   Pt requires supervision for slide board transfer to w/c and lateral scoot to [facility] over toilet with grab bar.  Pt requires total assist for posterior hygiene and clothing management.  Able to complete multiple sit<>squat with grab bar for clothing management.  Used stedy for prolonged standing for hygiene and donning clean brief/pants.  Pt able to complete anterior hygiene with set up assist.  Pt positioned in w/c with stedy and propelled to therapy gym with supervision.  PT instructed pt in BLE therex x10-12 reps bridging, small range trunk rotation, and hip flexion from hook lying position.  Pt returned to w/c and back to room with [name].   Therapy Documentation Precautions:  Precautions Precautions: Fall, Back Precaution Booklet  Issued: No Precaution Comments: Pt needed min cues to recall 3/3 back precautions Required Braces or Orthoses: Spinal Brace Spinal Brace: Thoracolumbosacral orthotic, Applied in sitting position Restrictions Weight Bearing Restrictions: No   See Function Navigator for Current Functional Status.   Therapy/Group: Individual Therapy  Michel Santee 04/27/2018, 11:11 AM

## 2018-04-27 NOTE — Progress Notes (Signed)
Occupational Therapy Session Note  Patient Details  Name: Cody Velez MRN: [id number] Date of Birth: 07-27-1946  Today's Date: 04/27/2018 OT Individual Time: 0355-9741 OT Individual Time Calculation (min): 35 min  and Today's Date: 04/27/2018 OT Missed Time: 10 Minutes Missed Time Reason: Other (comment)(Pharmacy in room, discussing medications)   Short Term Goals: Week 4:  OT Short Term Goal 1 (Week 4): Pt will pull pants up><down with mod A  OT Short Term Goal 2 (Week 4): Pt will tolerate standing for 1 minute for grooming at sink with min A  OT Short Term Goal 3 (Week 4): Pt will transfer to [facility] with min A   Skilled Therapeutic Interventions/Progress Updates:    Pt in w/c agreeable to therapy with c/o pain as described below. Session focused on toileting tasks and increased pt awareness re condition insight/safety. Extensive discussion with pt and his gf re toileting, including toileting aids to assist with hygiene, transfers, and overall safety with toileting tasks upon d/c. Pt demo poor-fair insight re balance and ability to perform toileting tasks. Pt transferred from w/c to EOB via lateral scoot with (S). Pt practiced several lateral leans to simulate leaning on BSC and used toilet aid to simulate posterior peri-hygiene. Pt then transferred via lateral scoot to bari [facility] with (S) and attempted to perform lateral leans but was unable to reach buttocks. Demo provided re alternative ways to reach, with pt practicing each method. Pt completed sit to stand transfer with RW and attempted to perform peri-hygiene from standing but required min A to maintain standing balance throughout. Pt edu re importance of holding onto stable surfaces such as grab bar d/t frequent mention of holding onto w/c in standing. Pt returned to bed and was left supine with bed alarm set and all needs met.   Therapy Documentation Precautions:  Precautions Precautions: Fall, Back Precaution Booklet Issued:  No Precaution Comments: Pt needed min cues to recall 3/3 back precautions Required Braces or Orthoses: Spinal Brace Spinal Brace: Thoracolumbosacral orthotic, Applied in sitting position Restrictions Weight Bearing Restrictions: No General: General OT Amount of Missed Time: 10 Minutes Vital Signs: Therapy Vitals Temp: 98 F (36.7 C) Temp Source: Oral Pulse Rate: 92 Resp: 18 BP: 126/74 Patient Position (if appropriate): Sitting Oxygen Therapy SpO2: 100 % O2 Device: Room Air Pain: Pain Assessment Pain Scale: 0-10 Pain Score: 9  Pain Type: Acute pain Pain Location: Back Pain Orientation: Lower;Mid Pain Descriptors / Indicators: Aching Pain Frequency: Constant Pain Onset: On-going Patients Stated Pain Goal: 3 Pain Intervention(s): Repositioned;Emotional support;Distraction ADL: ADL ADL Comments: Please see functional navigator  See Function Navigator for Current Functional Status.   Therapy/Group: Individual Therapy  Curtis Sites 04/27/2018, 5:16 PM

## 2018-04-28 ENCOUNTER — Inpatient Hospital Stay (HOSPITAL_COMMUNITY): Payer: Medicare Other | Admitting: Physical Therapy

## 2018-04-28 ENCOUNTER — Inpatient Hospital Stay (HOSPITAL_COMMUNITY): Payer: Medicare Other

## 2018-04-28 ENCOUNTER — Inpatient Hospital Stay (HOSPITAL_COMMUNITY): Payer: Medicare Other | Admitting: Occupational Therapy

## 2018-04-28 LAB — BASIC METABOLIC PANEL
Anion gap: 9 (ref 5–15)
BUN: 6 mg/dL (ref 6–20)
CO2: 28 mmol/L (ref 22–32)
Calcium: 8.8 mg/dL — ABNORMAL LOW (ref 8.9–10.3)
Chloride: 103 mmol/L (ref 101–111)
Creatinine, Ser: 0.82 mg/dL (ref 0.61–1.24)
GFR calc Af Amer: >60 mL/min (ref >60)
GFR calc non Af Amer: >60 mL/min (ref >60)
Glucose, Bld: 82 mg/dL (ref 65–99)
Potassium: 4.1 mmol/L (ref 3.5–5.1)
Sodium: 140 mmol/L (ref 135–145)

## 2018-04-28 LAB — PROTIME-INR
INR: 1.19
Prothrombin Time: 15 seconds (ref 11.4–15.2)

## 2018-04-28 LAB — GLUCOSE, CAPILLARY: Glucose-Capillary: 79 mg/dL (ref 65–99)

## 2018-04-28 MED ORDER — WARFARIN SODIUM 7.5 MG PO TABS
12.5000 mg | ORAL_TABLET | Freq: Once | ORAL | Status: AC
  Administered 2018-04-28: 12.5 mg via ORAL
  Filled 2018-04-28: qty 1

## 2018-04-28 NOTE — Progress Notes (Signed)
ANTICOAGULATION CONSULT NOTE - Initial Consult  Pharmacy Consult for Lovenox/coumadin Indication: DVT  No Known Allergies  Patient Measurements: Height: 6\' 3"  (190.5 cm) Weight: 269 lb 13.5 oz (122.4 kg) IBW/kg (Calculated) : 84.5   Vital Signs: Temp: 98.1 F (36.7 C) (05/10 0532) Temp Source: Oral (05/10 0532) BP: 144/88 (05/10 0532) Pulse Rate: 87 (05/10 0532)  Labs: Recent Labs    04/27/18 1625 04/28/18 0405  HGB 9.5*  --   HCT 33.1*  --   PLT 675*  --   LABPROT 14.5 15.0  INR 1.14 1.19    Estimated Creatinine Clearance: 119.4 mL/min (by C-G formula based on SCr of 0.76 mg/dL).  Assessment: 24 YOM transferred from 4N to inpatient rehab on 4/11 with sepsis/MSSA bacteremia and hx of spinal nerve stimulator placement earlier this year. Plan for plan for continue ancef and PO rifampin through 6/3 per ID recommendation.  Pt is currently on lovenox bridge to coumadin for b/l DVT, INR 1.19 this morning, after 7.5mg  x1. Significant drug interaction with rifampin, because of which I anticipate pt will need high coumadin dose until rifampin is stopped. Pt and family has been educated on coumadin monitoring and advised to have close anticoagulation follow up 1-2 weeks after completing the antibiotics   Scr 0.82 crcl > 100 ml/min hgb 9.5, pltc 675K  Goal of Therapy:  INR 2-3 Anti-Xa level 0.6-1 units/ml 4hrs after LMWH dose given Monitor platelets by anticoagulation protocol: Yes   Plan:  Lovenox 120 mg sq Q12 hrs Daily PT/INR in AM Coumadin 12.5mg  PO x 1 tonight   Maryanna Shape, PharmD, BCPS  Clinical Pharmacist  Pager: [phone]   04/28/2018,8:54 AM

## 2018-04-28 NOTE — Progress Notes (Signed)
Occupational Therapy Weekly Progress Note  Patient Details  Name: Cody Velez MRN: [id number] Date of Birth: 01-26-1946  Beginning of progress report period: Apr 20, 2018 End of progress report period: Apr 28, 2018   Patient has met 1 of 2 short term goals.  Pt continues to make steady progress with BADLs and overall activity tolerance but continues to exhibit fatigue when standing.  Pt is able to stand for approx 30 seconds with RW or Stedy before BLE fatigue.  Pt completes bathing and LB dressing tasks with min A using AE.  Pt performs lateral scoot transfers at supervision level.  Pt requires Stedy for transfers to [facility] in shower. Pt's SO has not been actively participated in therapy but has been present.  Patient continues to demonstrate the following deficits: muscle weakness, muscle joint tightness and muscle paralysis, decreased cardiorespiratoy endurance and unbalanced muscle activation, decreased coordination and decreased motor planning and therefore will continue to benefit from skilled OT intervention to enhance overall performance with BADL.  Patient progressing toward long term goals..  Continue plan of care.  OT Short Term Goals Week 4:  OT Short Term Goal 1 (Week 4): Pt will pull pants up><down with mod A  OT Short Term Goal 2 (Week 4): Pt will tolerate standing for 1 minute for grooming at sink with min A  OT Short Term Goal 2 - Progress (Week 4): Progressing toward goal OT Short Term Goal 3 (Week 4): Pt will transfer to [facility] with min A  OT Short Term Goal 3 - Progress (Week 4): Met Week 5:  OT Short Term Goal 1 (Week 5): STG=LTG secondary to ELOS      Therapy Documentation Precautions:  Precautions Precautions: Fall, Back Precaution Booklet Issued: No Precaution Comments: Pt needed min cues to recall 3/3 back precautions Required Braces or Orthoses: Spinal Brace Spinal Brace: Thoracolumbosacral orthotic, Applied in sitting position Restrictions Weight Bearing  Restrictions: No Vital Signs: Therapy Vitals Temp: 98.1 F (36.7 C) Temp Source: Oral Pulse Rate: 87 Resp: 18 BP: (!) 144/88 Patient Position (if appropriate): Lying Oxygen Therapy SpO2: 98 % O2 Device: CPAP    ADL: ADL ADL Comments: Please see functional navigator  See Function Navigator for Current Functional Status.     Leotis Shames [facility] 04/28/2018, 6:53 AM

## 2018-04-28 NOTE — Progress Notes (Signed)
Occupational Therapy Session Note  Patient Details  Name: Cody Velez MRN: [id number] Date of Birth: 06/25/1946  Today's Date: 04/28/2018 OT Individual Time: 2683-4196 OT Individual Time Calculation (min): 75 min    Short Term Goals: Week 5:  OT Short Term Goal 1 (Week 5): STG=LTG secondary to ELOS  Skilled Therapeutic Interventions/Progress Updates:    Pt resting in bed upon arrival.  OT intervention with focus on bed mobility, bathing at shower level, and dressing with sit<>stand from EOB.  Pt requires min A for bathing tasks seated BSC in shower.  Pt uses AE for LB bathing/dressing tasks.  Pt requires min A for pulling up pants while standing with RW.  Pt initiates pulling up pants but requires assistance to complete task.  Bed mobility at supervision level.  Pt fatigues quickly while standing and is able to stand for approx 30 seconds.  Pt returned to bed and remained in bed with RN present to administer meds.   Therapy Documentation Precautions:  Precautions Precautions: Fall, Back Precaution Booklet Issued: No Precaution Comments: Pt needed min cues to recall 3/3 back precautions Required Braces or Orthoses: Spinal Brace Spinal Brace: Thoracolumbosacral orthotic, Applied in sitting position Restrictions Weight Bearing Restrictions: No Pain:  Pt c/o 7/10 pain in back; RN aware and meds admin; repositioned  See Function Navigator for Current Functional Status.   Therapy/Group: Individual Therapy  [name] 04/28/2018, 8:17 AM

## 2018-04-28 NOTE — Progress Notes (Signed)
Subjective/Complaints: Had questions about coumadin and plan. Otherwise feeling well  ROS: Patient denies fever, rash, sore throat, blurred vision, nausea, vomiting, diarrhea, cough, shortness of breath or chest pain, joint  pain, headache, or mood change.     Objective: Vital Signs: Blood pressure (!) 144/88, pulse 87, temperature 98.1 F (36.7 C), temperature source Oral, resp. rate 18, height 6\' 3"  (1.905 m), weight 122.4 kg (269 lb 13.5 oz), SpO2 98 %. No results found. Results for orders placed or performed during the hospital encounter of 03/30/18 (from the past 72 hour(s))  Protime-INR     Status: None   Collection Time: 04/27/18  4:25 PM  Result Value Ref Range   Prothrombin Time 14.5 11.4 - 15.2 seconds   INR 1.14     Comment: Performed at [facility], [place] [address] [place], [place] [address]  CBC     Status: Abnormal   Collection Time: 04/27/18  4:25 PM  Result Value Ref Range   WBC 9.2 4.0 - 10.5 K/uL   RBC 3.89 (L) 4.22 - 5.81 MIL/uL   Hemoglobin 9.5 (L) 13.0 - 17.0 g/dL   HCT 33.1 (L) 39.0 - 52.0 %   MCV 85.1 78.0 - 100.0 fL   MCH 24.4 (L) 26.0 - 34.0 pg   MCHC 28.7 (L) 30.0 - 36.0 g/dL   RDW 16.9 (H) 11.5 - 15.5 %   Platelets 675 (H) 150 - 400 K/uL    Comment: Performed at [facility], [place] [address] [place], [place] [address]  Protime-INR     Status: None   Collection Time: 04/28/18  4:05 AM  Result Value Ref Range   Prothrombin Time 15.0 11.4 - 15.2 seconds   INR 1.19     Comment: Performed at [place] [address] [place], [place] [address]  Glucose, capillary     Status: None   Collection Time: 04/28/18  6:21 AM  Result Value Ref Range   Glucose-Capillary 79 65 - 99 mg/dL     Constitutional: No distress . Vital signs reviewed. HEENT: EOMI, oral membranes moist Neck: supple Cardiovascular: RRR without murmur. No JVD    Respiratory: CTA Bilaterally without wheezes or rales. Normal effort    GI: BS +, non-tender,  non-distended  Musc: No edema or tenderness in extremities. Neuro: Alert and Oriented.  Motor: 5/5 in BUE proximal to distal RLE: hip flexion 3+/5, knee extension 3+/5, ankle dorsiflexion/plantarflexion 4/5   LLE: Hip flexion   3-/5, KE 3/5 ankle dorsiflexion/PF 3+/5 (no changes today) Skin: incision clean. PICC in place     Assessment/Plan: 1. Functional deficits secondary to paraplegia secondary to epidural abscess which require 3+ hours per day of interdisciplinary therapy in a comprehensive inpatient rehab setting. Physiatrist is providing close team supervision and 24 hour management of active medical problems listed below. Physiatrist and rehab team continue to assess barriers to discharge/monitor patient progress toward functional and medical goals. FIM: Function - Bathing Bathing activity did not occur: Refused Position: Shower Body parts bathed by patient: Right arm, Left arm, Chest, Abdomen, Front perineal area, Buttocks, Right upper leg, Left upper leg, Right lower leg, Left lower leg, Back Body parts bathed by helper: Right lower leg, Left lower leg, Back Bathing not applicable: Buttocks, Right upper leg, Left upper leg, Right lower leg, Left lower leg, Back Assist Level: Assistive device, Touching or steadying assistance(Pt > 75%) Assistive Device Comment: LH sponge Set up : To obtain items  Function- Upper  Body Dressing/Undressing Upper body dressing/undressing activity did not occur: Refused What is the patient wearing?: Pull over shirt/dress, Orthosis Pull over shirt/dress - Perfomed by patient: Put head through opening, Thread/unthread left sleeve, Thread/unthread right sleeve, Pull shirt over trunk Pull over shirt/dress - Perfomed by helper: Pull shirt over trunk Orthosis activity level: Performed by helper Assist Level: Supervision or verbal cues Function - Lower Body Dressing/Undressing What is the patient wearing?: Underwear, Pants Position: Sitting EOB Underwear  - Performed by patient: Thread/unthread right underwear leg, Thread/unthread left underwear leg Underwear - Performed by helper: Pull underwear up/down Pants- Performed by patient: Thread/unthread right pants leg, Thread/unthread left pants leg Pants- Performed by helper: Pull pants up/down Non-skid slipper socks- Performed by patient: Don/doff right sock, Don/doff left sock Non-skid slipper socks- Performed by helper: Don/doff right sock, Don/doff left sock Socks - Performed by patient: Don/doff right sock, Don/doff left sock(with sock aide) Socks - Performed by helper: Don/doff right sock, Don/doff left sock Shoes - Performed by patient: Fasten right, Fasten left Shoes - Performed by helper: Don/doff right shoe, Don/doff left shoe, Fasten right, Fasten left Assist for footwear: Partial/moderate assist Assist for lower body dressing: Assistive device(sit<stand in [place]) Assistive Device Comment: Reacher + sock aide  Function - Toileting Toileting activity did not occur: No continent bowel/bladder event Toileting steps completed by patient: Adjust clothing after toileting Toileting steps completed by helper: Adjust clothing prior to toileting, Performs perineal hygiene, Adjust clothing after toileting Toileting Assistive Devices: Grab bar or rail Assist level: Set up/obtain supplies, More than reasonable time, Touching or steadying assistance (Pt.75%)  Function - Air cabin crew transfer activity did not occur: N/A Toilet transfer assistive device: Drop arm commode Mechanical lift: Stedy Assist level to toilet: Supervision or verbal cues Assist level from toilet: Supervision or verbal cues  Function - Chair/bed transfer Chair/bed transfer activity did not occur: N/A Chair/bed transfer method: Lateral scoot Chair/bed transfer assist level: Touching or steadying assistance (Pt > 75%) Chair/bed transfer assistive device: Orthosis, Armrests Mechanical lift: Stedy Chair/bed  transfer details: Verbal cues for precautions/safety, Verbal cues for safe use of DME/AE  Function - Locomotion: Wheelchair Will patient use wheelchair at discharge?: Yes Type: Manual Wheelchair activity did not occur: N/A Max wheelchair distance: 180 Assist Level: No help, No cues, assistive device, takes more than reasonable amount of time Wheel 50 feet with 2 turns activity did not occur: N/A Assist Level: No help, No cues, assistive device, takes more than reasonable amount of time Wheel 150 feet activity did not occur: N/A Assist Level: No help, No cues, assistive device, takes more than reasonable amount of time Turns around,maneuvers to table,bed, and toilet,negotiates 3% grade,maneuvers on rugs and over doorsills: No Function - Locomotion: Ambulation Ambulation activity did not occur: N/A Assistive device: Parallel bars, Maxi sky Max distance: 10' Assist level: Maximal assist (Pt 25 - 49%) Walk 10 feet activity did not occur: N/A Assist level: Maximal assist (Pt 25 - 49%)(with support of maxi sky) Walk 50 feet with 2 turns activity did not occur: N/A Walk 150 feet activity did not occur: N/A Walk 10 feet on uneven surfaces activity did not occur: N/A  Function - Comprehension Comprehension: Auditory Comprehension assist level: Follows complex conversation/direction with no assist  Function - Expression Expression: Verbal Expression assistive device: Communication board Expression assist level: Expresses complex ideas: With no assist  Function - Social Interaction Social Interaction assist level: Interacts appropriately with others with medication or extra time (anti-anxiety, antidepressant).  Function - Problem  Solving Problem solving assist level: Solves complex problems: Recognizes & self-corrects  Function - Memory Memory assist level: More than reasonable amount of time Patient normally able to recall (first 3 days only): Current season, Staff names and faces,  That he or she is in a hospital, Location of own room  Medical Problem List and Plan: 1.Decreased functional mobilitysecondary to T10-11 osteomyelitis/epidural abscess with subsequent myelopathy/paraplegia, status post decompression and fusion T8-L3. Back brace when out of bed. Patient with history of multiple lumbar surgeries as well as spinal cord stimulator January 2019   Continue CIR 2. DVT left soleal, right peroneal DVT's:.     prophylactic doses of Lovenox started on 4/12.    Repeat dopplers with ongoing DVT's in bilateral peroneal   and left soleal    -suspect he will need treatment of these given neurological status/mobility   -coumadin for rx    -recheck dopplers at outpt 3. Pain Management:Neurontin 600 mg 3 times daily, Robaxin   as needed   Lidoderm patch     -scheduled tylenol  -oxycodone prn. 4. Mood:Aricept 10 mg nightly, Namenda 10 mg twice daily, Effexor 150 mg twice daily, trazodone 100 mg nightly  -STM deficits are notable  -providing regular positive reinforcement re: prognosis   -appreciative of neuropsych visit yesterday 5. Neuropsych: This patientisfully capable of making decisions on hisown behalf. 6. Skin/Wound Care:staples out per NS 7. Fluids/Electrolytes/Nutrition:Routine I/O's  -supplementing potassium KCl 20 mEq/day.    Potassium level pending today   8.ID/MSSA bacteremia.Intravenous Ancef 2 g every 8 hours through 05/22/2018 and stop as well as rifampin 300 mg twice daily.    -outpt Follow-up per infectious disease 9.Acute on chronic anemia.     Hemoglobin  9.0 on 4/30, repeat 9.7 5/6, 9.5 on 5/9  -hemorrhoid cream prn  10.Hypertension. Norvasc 10 mg daily   Vitals:   04/27/18 2029 04/28/18 0532  BP: 117/76 (!) 144/88  Pulse: 83 87  Resp: 18 18  Temp: 99.5 F (37.5 C) 98.1 F (36.7 C)  SpO2: 97% 98%    Borderline/elevated DBP. Introduced prinivil 5mg  daily with some improvement   (on losartan PTA) 11.Hyperlipidemia.  Pravachol 12.History of prostate cancer. Proscar 5 mg daily 13.Constipation.     -improvement with linzess  -resumed senna-s  -sorbitol with results  -fleet enema prn  -had a red stool again this week, likely due to hemorrhoids 14. Urine retention, occasional incontinence seems to be improved  ---Ua negative, ucx negative 15. Morbid obesity   Body mass index is 33.73 kg/m.  Diet and exercise education  Encourage weight loss to increase endurance and promote overall health 16. Sleep disturbance  Melatonin added to trazodone on 5/5 with results.   LOS (Days) 29 A FACE TO FACE EVALUATION WAS PERFORMED  [name] 04/28/2018, 9:04 AM

## 2018-04-28 NOTE — Progress Notes (Signed)
Physical Therapy Session Note  Patient Details  Name: Cody Velez MRN: [id number] Date of Birth: 06/21/1946  Today's Date: 04/28/2018 PT Individual Time: 1000-1200 PT Individual Time Calculation (min): 120 min   Short Term Goals: Week 4:  PT Short Term Goal 1 (Week 4): Pt will tolerate 30 seconds of static stance with BUE for caregiver to assist with LB dressing/ADLs.  PT Short Term Goal 2 (Week 4): Pt will complete slide board transfer with assist for board placement and supervision for transfer PT Short Term Goal 3 (Week 4): Pt will recall 2 methods of pressure relief while in w/c with min cues PT Short Term Goal 4 (Week 4): Pt will be able to direct caregiver for donning TLSO with min question cues.   Skilled Therapeutic Interventions/Progress Updates:    no c/o pain at rest.  Session focus on w/c evaluation and hands on practice for real car transfer with pt and s/o Toni.    Pt transitioned to EOB with supervision and PT donned TLSO total assist.  Lateral scoot transfers in/out of w/c throughout session on level surfaces with supervision.  Pt participated in lightweight w/c evaluation for first portion of session.    Pt transported to main entrance to practice real car transfer.  Completed with mod assist with slideboard with PT, mod cues for sequencing.  Completed with apparent min assist with Toni with slideboard.  Pt able to demo much improved power up through LEs to boost into SUV.  Pt returned to room at end of session, positioned upright with call bell in reach and needs met.   Therapy Documentation Precautions:  Precautions Precautions: Fall, Back Precaution Booklet Issued: No Precaution Comments: Pt needed min cues to recall 3/3 back precautions Required Braces or Orthoses: Spinal Brace Spinal Brace: Thoracolumbosacral orthotic, Applied in sitting position Restrictions Weight Bearing Restrictions: No   See Function Navigator for Current Functional  Status.   Therapy/Group: Individual Therapy   E  04/28/2018, 12:48 PM  

## 2018-04-29 ENCOUNTER — Inpatient Hospital Stay (HOSPITAL_COMMUNITY): Payer: Medicare Other | Admitting: Occupational Therapy

## 2018-04-29 LAB — PROTIME-INR
INR: 2.17
Prothrombin Time: 24 seconds — ABNORMAL HIGH (ref 11.4–15.2)

## 2018-04-29 LAB — GLUCOSE, CAPILLARY: Glucose-Capillary: 93 mg/dL (ref 65–99)

## 2018-04-29 MED ORDER — WARFARIN SODIUM 5 MG PO TABS
5.0000 mg | ORAL_TABLET | Freq: Once | ORAL | Status: AC
  Administered 2018-04-29: 5 mg via ORAL
  Filled 2018-04-29: qty 1

## 2018-04-29 NOTE — Progress Notes (Signed)
Pt. Places cpap on himself. 

## 2018-04-29 NOTE — Progress Notes (Signed)
Occupational Therapy Session Note  Patient Details  Name: Cody Velez MRN: [id number] Date of Birth: 1946/03/21  Today's Date: 04/29/2018 OT Group Time: 1101-1201 OT Group Time Calculation (min): 60 min  Skilled Therapeutic Interventions/Progress Updates:    Pt participated in therapeutic w/c level dance group with focus on UE/LE strengthening, activity tolerance, and social participation. Pt was guided through various exercises, including straight-arm raises, overhead claps, leg lifts, and seated marches. He also held hands with other group members and swayed >3 minutes for proximal strengthening. Pt particularly challenged by LE exercises, however was motivated to complete. His caregiver, [name], was also there to provide encouragement and support. At end of tx [name] escorted pt back to room.   Therapy Documentation Precautions:  Precautions Precautions: Fall, Back Precaution Booklet Issued: No Precaution Comments: Pt needed min cues to recall 3/3 back precautions Required Braces or Orthoses: Spinal Brace Spinal Brace: Thoracolumbosacral orthotic, Applied in sitting position Restrictions Weight Bearing Restrictions: No Vital Signs: Therapy Vitals BP: 134/86 Pain: Pain Assessment Pain Scale: 0-10 Pain Score: 6  Pain Type: Acute pain;Surgical pain Pain Location: Back Pain Orientation: Mid Pain Descriptors / Indicators: Aching Pain Frequency: Constant Pain Onset: On-going Pain Intervention(s): Medication (See eMAR) ADL: ADL ADL Comments: Please see functional navigator     See Function Navigator for Current Functional Status.   Therapy/Group: Group Therapy  Denaya Horn A Mateus Rewerts 04/29/2018, 12:48 PM

## 2018-04-29 NOTE — Progress Notes (Signed)
Patient refused PRN bowel medication

## 2018-04-29 NOTE — Progress Notes (Signed)
Patient did not need any assistance with home CPAP, but understood to call respiratory if he should require assistance at any time. NO distress noted, RCP will continue to monitor.

## 2018-04-29 NOTE — Progress Notes (Signed)
Subjective/Complaints: No new issues.  Friend asked about whether he would need Lovenox shots at home  ROS: Patient denies fever, rash, sore throat, blurred vision, nausea, vomiting, diarrhea, cough, shortness of breath or chest pain, , headache, or mood change.   Objective: Vital Signs: Blood pressure (!) 147/94, pulse 80, temperature 99.1 F (37.3 C), temperature source Oral, resp. rate 16, height 6' 3" (1.905 m), weight 123.9 kg (273 lb 2.4 oz), SpO2 96 %. No results found. Results for orders placed or performed during the hospital encounter of 03/30/18 (from the past 72 hour(s))  Protime-INR     Status: None   Collection Time: 04/27/18  4:25 PM  Result Value Ref Range   Prothrombin Time 14.5 11.4 - 15.2 seconds   INR 1.14     Comment: Performed at [facility], [place] [address], [place], [place] [address]  CBC     Status: Abnormal   Collection Time: 04/27/18  4:25 PM  Result Value Ref Range   WBC 9.2 4.0 - 10.5 K/uL   RBC 3.89 (L) 4.22 - 5.81 MIL/uL   Hemoglobin 9.5 (L) 13.0 - 17.0 g/dL   HCT 33.1 (L) 39.0 - 52.0 %   MCV 85.1 78.0 - 100.0 fL   MCH 24.4 (L) 26.0 - 34.0 pg   MCHC 28.7 (L) 30.0 - 36.0 g/dL   RDW 16.9 (H) 11.5 - 15.5 %   Platelets 675 (H) 150 - 400 K/uL    Comment: Performed at [facility], [place] [address], [place], [place] [address]  Protime-INR     Status: None   Collection Time: 04/28/18  4:05 AM  Result Value Ref Range   Prothrombin Time 15.0 11.4 - 15.2 seconds   INR 1.19     Comment: Performed at [facility] [address], [place], [place] [address]  Glucose, capillary     Status: None   Collection Time: 04/28/18  6:21 AM  Result Value Ref Range   Glucose-Capillary 79 65 - 99 mg/dL  Basic metabolic panel     Status: Abnormal   Collection Time: 04/28/18  6:50 AM  Result Value Ref Range   Sodium 140 135 - 145 mmol/L   Potassium 4.1 3.5 - 5.1 mmol/L   Chloride 103 101 - 111 mmol/L   CO2 28 22 - 32 mmol/L   Glucose, Bld 82 65 - 99  mg/dL   BUN 6 6 - 20 mg/dL   Creatinine, Ser 0.82 0.61 - 1.24 mg/dL   Calcium 8.8 (L) 8.9 - 10.3 mg/dL   GFR calc non Af Amer >60 >60 mL/min   GFR calc Af Amer >60 >60 mL/min    Comment: (NOTE) The eGFR has been calculated using the CKD EPI equation. This calculation has not been validated in all clinical situations. eGFR's persistently <60 mL/min signify possible Chronic Kidney Disease.    Anion gap 9 5 - 15    Comment: Performed at [facility] [address], [place], [place] [address]  Protime-INR     Status: Abnormal   Collection Time: 04/29/18  4:44 AM  Result Value Ref Range   Prothrombin Time 24.0 (H) 11.4 - 15.2 seconds   INR 2.17     Comment: Performed at [facility] [address] [place], [place] [address]  Glucose, capillary     Status: None   Collection Time: 04/29/18  7:29 AM  Result Value Ref Range   Glucose-Capillary 93 65 -  99 mg/dL     Constitutional: No distress . Vital signs reviewed. HEENT: EOMI, oral membranes moist Neck: supple Cardiovascular: RRR without murmur. No JVD    Respiratory: CTA Bilaterally without wheezes or rales. Normal effort    GI: BS +, non-tender, non-distended   Musc: No edema or tenderness in extremities. Neuro: Alert and Oriented.  Motor: 5/5 in BUE proximal to distal RLE: hip flexion 3+/5, knee extension 3+/5, ankle dorsiflexion/plantarflexion 4/5   LLE: Hip flexion   3-/5, KE 3/5 ankle dorsiflexion/PF 3+/5 (no changes today) Skin: incision clean. PICC in place     Assessment/Plan: 1. Functional deficits secondary to paraplegia secondary to epidural abscess which require 3+ hours per day of interdisciplinary therapy in a comprehensive inpatient rehab setting. Physiatrist is providing close team supervision and 24 hour management of active medical problems listed below. Physiatrist and rehab team continue to assess barriers to discharge/monitor patient progress toward functional and medical goals. FIM: Function -  Bathing Bathing activity did not occur: Refused Position: Shower Body parts bathed by patient: Right arm, Left arm, Chest, Abdomen, Front perineal area, Buttocks, Right upper leg, Left upper leg, Right lower leg, Left lower leg, Back Body parts bathed by helper: Right lower leg, Left lower leg, Back Bathing not applicable: Buttocks, Right upper leg, Left upper leg, Right lower leg, Left lower leg, Back Assist Level: Assistive device, Touching or steadying assistance(Pt > 75%) Assistive Device Comment: LH sponge Set up : To obtain items  Function- Upper Body Dressing/Undressing Upper body dressing/undressing activity did not occur: Refused What is the patient wearing?: Pull over shirt/dress, Orthosis Pull over shirt/dress - Perfomed by patient: Put head through opening, Thread/unthread left sleeve, Thread/unthread right sleeve, Pull shirt over trunk Pull over shirt/dress - Perfomed by helper: Pull shirt over trunk Orthosis activity level: Performed by helper Assist Level: Supervision or verbal cues Function - Lower Body Dressing/Undressing What is the patient wearing?: Underwear, Pants Position: Sitting EOB Underwear - Performed by patient: Thread/unthread right underwear leg, Thread/unthread left underwear leg Underwear - Performed by helper: Pull underwear up/down Pants- Performed by patient: Thread/unthread right pants leg, Thread/unthread left pants leg Pants- Performed by helper: Pull pants up/down Non-skid slipper socks- Performed by patient: Don/doff right sock, Don/doff left sock Non-skid slipper socks- Performed by helper: Don/doff right sock, Don/doff left sock Socks - Performed by patient: Don/doff right sock, Don/doff left sock(with sock aide) Socks - Performed by helper: Don/doff right sock, Don/doff left sock Shoes - Performed by patient: Fasten right, Fasten left Shoes - Performed by helper: Don/doff right shoe, Don/doff left shoe, Fasten right, Fasten left Assist for  footwear: Partial/moderate assist Assist for lower body dressing: Assistive device(sit<stand in Stedy) Assistive Device Comment: Reacher + sock aide  Function - Toileting Toileting activity did not occur: No continent bowel/bladder event Toileting steps completed by patient: Adjust clothing after toileting Toileting steps completed by helper: Adjust clothing prior to toileting, Performs perineal hygiene, Adjust clothing after toileting Toileting Assistive Devices: Grab bar or rail Assist level: Set up/obtain supplies, More than reasonable time, Touching or steadying assistance (Pt.75%)  Function - Toilet Transfers Toilet transfer activity did not occur: N/A Toilet transfer assistive device: Drop arm commode Mechanical lift: Stedy Assist level to toilet: Supervision or verbal cues Assist level from toilet: Supervision or verbal cues  Function - Chair/bed transfer Chair/bed transfer activity did not occur: N/A Chair/bed transfer method: Lateral scoot Chair/bed transfer assist level: Supervision or verbal cues Chair/bed transfer assistive device: Armrests Mechanical lift: Stedy Chair/bed   transfer details: Verbal cues for precautions/safety, Verbal cues for safe use of DME/AE  Function - Locomotion: Wheelchair Will patient use wheelchair at discharge?: Yes Type: Manual Wheelchair activity did not occur: N/A Max wheelchair distance: 180 Assist Level: No help, No cues, assistive device, takes more than reasonable amount of time Wheel 50 feet with 2 turns activity did not occur: N/A Assist Level: No help, No cues, assistive device, takes more than reasonable amount of time Wheel 150 feet activity did not occur: N/A Assist Level: No help, No cues, assistive device, takes more than reasonable amount of time Turns around,maneuvers to table,bed, and toilet,negotiates 3% grade,maneuvers on rugs and over doorsills: No Function - Locomotion: Ambulation Ambulation activity did not occur:  N/A Assistive device: Parallel bars, Maxi sky Max distance: 10' Assist level: Maximal assist (Pt 25 - 49%) Walk 10 feet activity did not occur: N/A Assist level: Maximal assist (Pt 25 - 49%)(with support of maxi sky) Walk 50 feet with 2 turns activity did not occur: N/A Walk 150 feet activity did not occur: N/A Walk 10 feet on uneven surfaces activity did not occur: N/A  Function - Comprehension Comprehension: Auditory Comprehension assist level: Follows complex conversation/direction with no assist  Function - Expression Expression: Verbal Expression assistive device: Communication board Expression assist level: Expresses complex ideas: With no assist  Function - Social Interaction Social Interaction assist level: Interacts appropriately with others with medication or extra time (anti-anxiety, antidepressant).  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 Back brace when out of bed. Patient with history of multiple lumbar surgeries as well as spinal cord stimulator January 2019   Continue CIR 2. DVT left soleal, right peroneal DVT's:.     prophylactic doses of Lovenox started on 4/12.    Repeat dopplers with ongoing DVT's in bilateral peroneal   and left soleal   -coumadin for rx---INR therapeutic today. Dc lovenox    -recheck dopplers at outpt 3. Pain Management:Neurontin 600 mg 3 times daily, Robaxin   as needed   Lidoderm patch     -scheduled tylenol  -oxycodone prn. 4. Mood:Aricept 10 mg  nightly, Namenda 10 mg twice daily, Effexor 150 mg twice daily, trazodone 100 mg nightly  -STM deficits are notable  -providing regular positive reinforcement re: prognosis   -appreciative of neuropsych visit yesterday 5. Neuropsych: This patientisfully capable of making decisions on hisown behalf. 6. Skin/Wound Care:staples out per NS 7. Fluids/Electrolytes/Nutrition:Routine I/O's  -supplementing potassium KCl 20 mEq/day.    Potassium level 4.1 on 5/10   8.ID/MSSA bacteremia.Intravenous Ancef 2 g every 8 hours through 05/22/2018 and stop as well as rifampin 300 mg twice daily.    -outpt Follow-up per infectious disease 9.Acute on chronic anemia.     Hemoglobin  9.0 on 4/30, repeat 9.7 5/6, 9.5 on 5/9  -hemorrhoid cream prn  10.Hypertension. Norvasc 10 mg daily   Vitals:   04/28/18 2003 04/29/18 0617  BP: 137/89 (!) 147/94  Pulse: 87 80  Resp: 16 16  Temp: 99.4 F (37.4 C) 99.1 F (37.3 C)  SpO2: 97% 96%    Borderline/elevated DBP. Introduced   prinivil 5mg daily with some improvement   (on losartan PTA) 11.Hyperlipidemia. Pravachol 12.History of prostate cancer. Proscar 5 mg daily 13.Constipation.     -improvement with linzess  -resumed senna-s  -sorbitol with results  -fleet enema prn  -had a red stool again this week, likely due to hemorrhoids 14. Urine retention, occasional incontinence seems to be improved  ---Ua negative, ucx negative 15. Morbid obesity   Body mass index is 34.14 kg/m.  Diet and exercise education  Encourage weight loss to increase endurance and promote overall health 16. Sleep disturbance  Melatonin added to trazodone on 5/5 with results.   LOS (Days) 30 A FACE TO FACE EVALUATION WAS PERFORMED  Zachary T Swartz 04/29/2018, 8:33 AM   

## 2018-04-29 NOTE — Progress Notes (Signed)
ANTICOAGULATION CONSULT NOTE - Follow Up Consult  Pharmacy Consult for warfarin Indication: DVT  No Known Allergies  Patient Measurements: Height: 6\' 3"  (190.5 cm) Weight: 273 lb 2.4 oz (123.9 kg) IBW/kg (Calculated) : 84.5   Vital Signs: Temp: 99.1 F (37.3 C) (05/11 0617) Temp Source: Oral (05/11 0617) BP: 134/86 (05/11 0927) Pulse Rate: 80 (05/11 0617)  Labs: Recent Labs    04/27/18 1625 04/28/18 0405 04/28/18 0650 04/29/18 0444  HGB 9.5*  --   --   --   HCT 33.1*  --   --   --   PLT 675*  --   --   --   LABPROT 14.5 15.0  --  24.0*  INR 1.14 1.19  --  2.17  CREATININE  --   --  0.82  --     Estimated Creatinine Clearance: 117.2 mL/min (by C-G formula based on SCr of 0.82 mg/dL).   Medications:  Scheduled:  . acetaminophen  650 mg Oral TID  . amLODipine  10 mg Oral Daily  . aspirin EC  81 mg Oral Daily  . cycloSPORINE  1 drop Both Eyes BID  . donepezil  10 mg Oral QHS  . feeding supplement (ENSURE ENLIVE)  237 mL Oral BID BM  . finasteride  5 mg Oral Daily  . gabapentin  600 mg Oral TID  . latanoprost  1 drop Both Eyes QHS  . lidocaine  1 patch Transdermal Q24H  . linaclotide  145 mcg Oral QAC breakfast  . lisinopril  5 mg Oral Daily  . Melatonin  1.5 mg Oral QHS  . memantine  10 mg Oral BID  . methocarbamol  500 mg Oral QID  . pantoprazole  40 mg Oral Daily  . potassium chloride  20 mEq Oral Daily  . pravastatin  10 mg Oral QPM  . rifampin  300 mg Oral Q12H  . senna-docusate  2 tablet Oral QHS  . traZODone  100 mg Oral QHS  . venlafaxine XR  150 mg Oral BID  . vitamin B-12  1,000 mcg Oral Daily  . warfarin  5 mg Oral ONCE-1800  . Warfarin - Pharmacist Dosing Inpatient   Does not apply q1800    Assessment: 71 YOM transferred to CIR on 4/11 with MSSA bacteremia currently being treated with cefazolin and rifampin through 05/22/18 (per ID recs). DVT noted on doppler 4/12, decision to treat with anticoagulants is complicated by rifampin drug  interaction with all oral anticoagulants. Full anticoagulation with warfarin and lovenox bridge began 5/9. High doses of warfarin were used initially to overcome interaction with rifampin. Patient and family were educated on warfarin monitoring and advised to follow up within 1-2 weeks of completing rifampin, as warfarin will need to be adjusted.  INR is therapeutic today at 2.17, which is a significant increase from yesterday (1.19) after only 2 doses. Lovenox was discontinued as INR is > 2.   Goal of Therapy:  INR 2-3 Monitor platelets by anticoagulation protocol: Yes   Plan:  Warfarin 5 mg PO x 1 tonight Daily INR, check CBC tomorrow with large increase in INR Monitor s/sx of bleeding Discharge planning: follow up closely with anticoag clinic, especially after rifampin ends 6/3   Charlene Brooke, PharmD PGY1 Pharmacy Resident Phone: [phone] After 3:30PM please call [place] [phone] 04/29/2018,9:34 AM

## 2018-04-30 LAB — CBC
HCT: 32.6 % — ABNORMAL LOW (ref 39.0–52.0)
Hemoglobin: 9.2 g/dL — ABNORMAL LOW (ref 13.0–17.0)
MCH: 24 pg — ABNORMAL LOW (ref 26.0–34.0)
MCHC: 28.2 g/dL — ABNORMAL LOW (ref 30.0–36.0)
MCV: 84.9 fL (ref 78.0–100.0)
Platelets: 572 10*3/uL — ABNORMAL HIGH (ref 150–400)
RBC: 3.84 MIL/uL — ABNORMAL LOW (ref 4.22–5.81)
RDW: 16.8 % — ABNORMAL HIGH (ref 11.5–15.5)
WBC: 5.9 10*3/uL (ref 4.0–10.5)

## 2018-04-30 LAB — PROTIME-INR
INR: 2.22
Prothrombin Time: 24.4 seconds — ABNORMAL HIGH (ref 11.4–15.2)

## 2018-04-30 MED ORDER — WARFARIN SODIUM 5 MG PO TABS
5.0000 mg | ORAL_TABLET | Freq: Once | ORAL | Status: AC
  Administered 2018-04-30: 5 mg via ORAL
  Filled 2018-04-30: qty 1

## 2018-04-30 NOTE — Progress Notes (Signed)
Subjective/Complaints: Patient slept well.  No new issues.  ROS: Patient denies fever, rash, sore throat, blurred vision, nausea, vomiting, diarrhea, cough, shortness of breath or chest pain, joint or back pain, headache, or mood change.    Objective: Vital Signs: Blood pressure (!) 156/88, pulse 78, temperature 98.2 F (36.8 C), temperature source Oral, resp. rate 17, height '6\' 3"'  (1.905 m), weight 122.3 kg (269 lb 10 oz), SpO2 98 %. No results found. Results for orders placed or performed during the hospital encounter of 03/30/18 (from the past 72 hour(s))  Protime-INR     Status: None   Collection Time: 04/27/18  4:25 PM  Result Value Ref Range   Prothrombin Time 14.5 11.4 - 15.2 seconds   INR 1.14     Comment: Performed at [facility], [place] [address], [place], [place] [address]  CBC     Status: Abnormal   Collection Time: 04/27/18  4:25 PM  Result Value Ref Range   WBC 9.2 4.0 - 10.5 K/uL   RBC 3.89 (L) 4.22 - 5.81 MIL/uL   Hemoglobin 9.5 (L) 13.0 - 17.0 g/dL   HCT 33.1 (L) 39.0 - 52.0 %   MCV 85.1 78.0 - 100.0 fL   MCH 24.4 (L) 26.0 - 34.0 pg   MCHC 28.7 (L) 30.0 - 36.0 g/dL   RDW 16.9 (H) 11.5 - 15.5 %   Platelets 675 (H) 150 - 400 K/uL    Comment: Performed at [facility], [place] [address] [place], [place] [address]  Protime-INR     Status: None   Collection Time: 04/28/18  4:05 AM  Result Value Ref Range   Prothrombin Time 15.0 11.4 - 15.2 seconds   INR 1.19     Comment: Performed at [facility] [address] [place], [place] [address]  Glucose, capillary     Status: None   Collection Time: 04/28/18  6:21 AM  Result Value Ref Range   Glucose-Capillary 79 65 - 99 mg/dL  Basic metabolic panel     Status: Abnormal   Collection Time: 04/28/18  6:50 AM  Result Value Ref Range   Sodium 140 135 - 145 mmol/L   Potassium 4.1 3.5 - 5.1 mmol/L   Chloride 103 101 - 111 mmol/L   CO2 28 22 - 32 mmol/L   Glucose, Bld 82 65 - 99 mg/dL   BUN 6 6 - 20  mg/dL   Creatinine, Ser 0.82 0.61 - 1.24 mg/dL   Calcium 8.8 (L) 8.9 - 10.3 mg/dL   GFR calc non Af Amer >60 >60 mL/min   GFR calc Af Amer >60 >60 mL/min    Comment: (NOTE) The eGFR has been calculated using the CKD EPI equation. This calculation has not been validated in all clinical situations. eGFR's persistently <60 mL/min signify possible Chronic Kidney Disease.    Anion gap 9 5 - 15    Comment: Performed at [facility] [address] [place], [place] [address]  Protime-INR     Status: Abnormal   Collection Time: 04/29/18  4:44 AM  Result Value Ref Range   Prothrombin Time 24.0 (H) 11.4 - 15.2 seconds   INR 2.17     Comment: Performed at [facility] [address] [place], [place] [address]  Glucose, capillary     Status: None   Collection Time: 04/29/18  7:29 AM  Result Value Ref Range   Glucose-Capillary 93 65 - 99 mg/dL  Protime-INR  Status: Abnormal   Collection Time: 04/30/18  4:15 AM  Result Value Ref Range   Prothrombin Time 24.4 (H) 11.4 - 15.2 seconds   INR 2.22     Comment: Performed at [facility] [address], [place], [place] [address]  CBC     Status: Abnormal   Collection Time: 04/30/18  4:15 AM  Result Value Ref Range   WBC 5.9 4.0 - 10.5 K/uL   RBC 3.84 (L) 4.22 - 5.81 MIL/uL   Hemoglobin 9.2 (L) 13.0 - 17.0 g/dL   HCT 32.6 (L) 39.0 - 52.0 %   MCV 84.9 78.0 - 100.0 fL   MCH 24.0 (L) 26.0 - 34.0 pg   MCHC 28.2 (L) 30.0 - 36.0 g/dL   RDW 16.8 (H) 11.5 - 15.5 %   Platelets 572 (H) 150 - 400 K/uL    Comment: Performed at [facility], [place] [address], [place], [place] [address]     Constitutional: No distress . Vital signs reviewed. HEENT: EOMI, oral membranes moist Neck: supple Cardiovascular: RRR without murmur. No JVD    Respiratory: CTA Bilaterally without wheezes or rales. Normal effort    GI: BS +, non-tender, non-distended  Musc: No edema or tenderness in extremities. Neuro: Alert and Oriented.  Motor:  5/5 in BUE proximal to distal RLE: hip flexion 3+/5, knee extension 3+/5, ankle dorsiflexion/plantarflexion 4/5   LLE: Hip flexion   3-/5, KE 3/5 ankle dorsiflexion/PF 3+/5 (no changes today) Skin: incision clean. PICC in place     Assessment/Plan: 1. Functional deficits secondary to paraplegia secondary to epidural abscess which require 3+ hours per day of interdisciplinary therapy in a comprehensive inpatient rehab setting. Physiatrist is providing close team supervision and 24 hour management of active medical problems listed below. Physiatrist and rehab team continue to assess barriers to discharge/monitor patient progress toward functional and medical goals. FIM: Function - Bathing Bathing activity did not occur: Refused Position: Shower Body parts bathed by patient: Right arm, Left arm, Chest, Abdomen, Front perineal area, Buttocks, Right upper leg, Left upper leg, Right lower leg, Left lower leg, Back Body parts bathed by helper: Right lower leg, Left lower leg, Back Bathing not applicable: Buttocks, Right upper leg, Left upper leg, Right lower leg, Left lower leg, Back Assist Level: Assistive device, Touching or steadying assistance(Pt > 75%) Assistive Device Comment: LH sponge Set up : To obtain items  Function- Upper Body Dressing/Undressing Upper body dressing/undressing activity did not occur: Refused What is the patient wearing?: Pull over shirt/dress, Orthosis Pull over shirt/dress - Perfomed by patient: Put head through opening, Thread/unthread left sleeve, Thread/unthread right sleeve, Pull shirt over trunk Pull over shirt/dress - Perfomed by helper: Pull shirt over trunk Orthosis activity level: Performed by helper Assist Level: Supervision or verbal cues Function - Lower Body Dressing/Undressing What is the patient wearing?: Underwear, Pants Position: Sitting EOB Underwear - Performed by patient: Thread/unthread right underwear leg, Thread/unthread left underwear  leg Underwear - Performed by helper: Pull underwear up/down Pants- Performed by patient: Thread/unthread right pants leg, Thread/unthread left pants leg Pants- Performed by helper: Pull pants up/down Non-skid slipper socks- Performed by patient: Don/doff right sock, Don/doff left sock Non-skid slipper socks- Performed by helper: Don/doff right sock, Don/doff left sock Socks - Performed by patient: Don/doff right sock, Don/doff left sock(with sock aide) Socks - Performed by helper: Don/doff right sock, Don/doff left sock Shoes - Performed by patient: Fasten right, Fasten left Shoes - Performed by helper: Don/doff right  shoe, Don/doff left shoe, Fasten right, Fasten left Assist for footwear: Partial/moderate assist Assist for lower body dressing: Assistive device(sit<stand in [place]) Assistive Device Comment: Reacher + sock aide  Function - Toileting Toileting activity did not occur: No continent bowel/bladder event Toileting steps completed by patient: Adjust clothing after toileting Toileting steps completed by helper: Adjust clothing prior to toileting, Performs perineal hygiene, Adjust clothing after toileting Toileting Assistive Devices: Grab bar or rail Assist level: Set up/obtain supplies, More than reasonable time, Touching or steadying assistance (Pt.75%)  Function - Air cabin crew transfer activity did not occur: N/A Toilet transfer assistive device: Drop arm commode Mechanical lift: Stedy Assist level to toilet: 2 helpers Assist level from toilet: 2 helpers  Function - Chair/bed transfer Chair/bed transfer activity did not occur: N/A Chair/bed transfer method: Lateral scoot Chair/bed transfer assist level: Supervision or verbal cues Chair/bed transfer assistive device: Armrests Mechanical lift: Stedy Chair/bed transfer details: Verbal cues for precautions/safety, Verbal cues for safe use of DME/AE  Function - Locomotion: Wheelchair Will patient use wheelchair at  discharge?: Yes Type: Manual Wheelchair activity did not occur: N/A Max wheelchair distance: 180 Assist Level: No help, No cues, assistive device, takes more than reasonable amount of time Wheel 50 feet with 2 turns activity did not occur: N/A Assist Level: No help, No cues, assistive device, takes more than reasonable amount of time Wheel 150 feet activity did not occur: N/A Assist Level: No help, No cues, assistive device, takes more than reasonable amount of time Turns around,maneuvers to table,bed, and toilet,negotiates 3% grade,maneuvers on rugs and over doorsills: No Function - Locomotion: Ambulation Ambulation activity did not occur: N/A Assistive device: Parallel bars, Maxi sky Max distance: 10' Assist level: Maximal assist (Pt 25 - 49%) Walk 10 feet activity did not occur: N/A Assist level: Maximal assist (Pt 25 - 49%)(with support of maxi sky) Walk 50 feet with 2 turns activity did not occur: N/A Walk 150 feet activity did not occur: N/A Walk 10 feet on uneven surfaces activity did not occur: N/A  Function - Comprehension Comprehension: Auditory Comprehension assist level: Follows complex conversation/direction with no assist  Function - Expression Expression: Verbal Expression assistive device: Communication board Expression assist level: Expresses complex ideas: With no assist  Function - Social Interaction Social Interaction assist level: Interacts appropriately with others with medication or extra time (anti-anxiety, antidepressant).  Function - Problem Solving Problem solving assist level: Solves complex problems: Recognizes & self-corrects  Function - Memory Memory assist level: More than reasonable amount of time Patient normally able to recall (first 3 days only): Current season, Staff names and faces, That he or she is in a hospital, Location of own room  Medical Problem List and Plan: 1.Decreased functional mobilitysecondary to T10-11  osteomyelitis/epidural abscess with subsequent myelopathy/paraplegia, status post decompression and fusion T8-L3. Back brace when out of bed. Patient with history of multiple lumbar surgeries as well as spinal cord stimulator January 2019   Continue CIR 2. DVT left soleal, right peroneal DVT's:.     prophylactic doses of Lovenox started on 4/12.    Repeat dopplers with ongoing DVT's in bilateral peroneal   and left soleal   -coumadin for rx---INR therapeutic       -recheck dopplers at outpt 3. Pain Management:Neurontin 600 mg 3 times daily, Robaxin   as needed   Lidoderm patch     -scheduled tylenol  -oxycodone prn. 4. Mood:Aricept 10 mg nightly, Namenda 10 mg twice daily, Effexor 150 mg twice daily, trazodone  100 mg nightly  -STM deficits are notable  -providing regular positive reinforcement re: prognosis   -appreciative of neuropsych visit yesterday 5. Neuropsych: This patientisfully capable of making decisions on hisown behalf. 6. Skin/Wound Care:staples out per NS 7. Fluids/Electrolytes/Nutrition:Routine I/O's  -supplementing potassium KCl 20 mEq/day.    Potassium level 4.1 on 5/10   8.ID/MSSA bacteremia.Intravenous Ancef 2 g every 8 hours through 05/22/2018 and stop as well as rifampin 300 mg twice daily.    -outpt Follow-up per infectious disease 9.Acute on chronic anemia.     Hemoglobin  9.0 on 4/30, repeat 9.7 5/6, 9.5 on 5/9  -hemorrhoid cream prn  10.Hypertension. Norvasc 10 mg daily   Vitals:   04/29/18 2100 04/30/18 0520  BP: (!) 150/87 (!) 156/88  Pulse: 84 78  Resp: 18 17  Temp:  98.2 F (36.8 C)  SpO2: 95% 98%    Borderline/elevated DBP. Introduced prinivil 8m daily with some improvement.  Consider further increase to 10 mg   (on losartan PTA) 11.Hyperlipidemia. Pravachol 12.History of prostate cancer. Proscar 5 mg daily 13.Constipation.     -improvement with linzess  -resumed senna-s  -sorbitol with results  -fleet enema  prn  -had a red stool again this week, likely due to hemorrhoids 14. Urine retention, occasional incontinence seems to be improved  ---Ua negative, ucx negative 15. Morbid obesity   Body mass index is 33.7 kg/m.  Diet and exercise education  Encourage weight loss to increase endurance and promote overall health 16. Sleep disturbance  Melatonin added to trazodone on 5/5 with results.   LOS (Days) 3Cavalier ZMeredith Staggers5/11/2018, 7:58 AM

## 2018-04-30 NOTE — Progress Notes (Signed)
Continent Using urinal. Continues to refuse senna S and lidoderm patch. LBM 5/9. Cody Velez A

## 2018-04-30 NOTE — Progress Notes (Signed)
ANTICOAGULATION CONSULT NOTE - Follow Up Consult  Pharmacy Consult for warfarin Indication: DVT  No Known Allergies  Patient Measurements: Height: 6\' 3"  (190.5 cm) Weight: 269 lb 10 oz (122.3 kg) IBW/kg (Calculated) : 84.5   Vital Signs: Temp: 98.2 F (36.8 C) (05/12 0520) Temp Source: Oral (05/12 0520) BP: 131/70 (05/12 0900) Pulse Rate: 78 (05/12 0520)  Labs: Recent Labs    04/27/18 1625 04/28/18 0405 04/28/18 0650 04/29/18 0444 04/30/18 0415  HGB 9.5*  --   --   --  9.2*  HCT 33.1*  --   --   --  32.6*  PLT 675*  --   --   --  572*  LABPROT 14.5 15.0  --  24.0* 24.4*  INR 1.14 1.19  --  2.17 2.22  CREATININE  --   --  0.82  --   --     Estimated Creatinine Clearance: 116.4 mL/min (by C-G formula based on SCr of 0.82 mg/dL).   Medications:  Scheduled:  . acetaminophen  650 mg Oral TID  . amLODipine  10 mg Oral Daily  . aspirin EC  81 mg Oral Daily  . cycloSPORINE  1 drop Both Eyes BID  . donepezil  10 mg Oral QHS  . feeding supplement (ENSURE ENLIVE)  237 mL Oral BID BM  . finasteride  5 mg Oral Daily  . gabapentin  600 mg Oral TID  . latanoprost  1 drop Both Eyes QHS  . lidocaine  1 patch Transdermal Q24H  . linaclotide  145 mcg Oral QAC breakfast  . lisinopril  5 mg Oral Daily  . Melatonin  1.5 mg Oral QHS  . memantine  10 mg Oral BID  . methocarbamol  500 mg Oral QID  . pantoprazole  40 mg Oral Daily  . potassium chloride  20 mEq Oral Daily  . pravastatin  10 mg Oral QPM  . rifampin  300 mg Oral Q12H  . senna-docusate  2 tablet Oral QHS  . traZODone  100 mg Oral QHS  . venlafaxine XR  150 mg Oral BID  . vitamin B-12  1,000 mcg Oral Daily  . Warfarin - Pharmacist Dosing Inpatient   Does not apply q1800    Assessment: 71 YOM transferred to CIR on 4/11 with MSSA bacteremia currently being treated with cefazolin and rifampin through 05/22/18 (per ID recs). DVT noted on doppler 4/12, decision to anticoagulate is complicated by rifampin drug  interaction with all oral anticoagulants. Full anticoagulation with warfarin and lovenox bridge began 5/9. High doses of warfarin were used initially to overcome interaction with rifampin - INR was therapeutic after only 2 doses. Patient and family were educated on warfarin monitoring and advised to follow up within 1-2 weeks of completing rifampin, as warfarin will need to be adjusted.  INR is therapeutic today, up slightly from yesterday at 2.17> 2.22. HGB is low but stable, PLT are elevated, no bleeding noted.   Goal of Therapy:  INR 2-3 Monitor platelets by anticoagulation protocol: Yes   Plan:  Warfarin 5 mg PO x 1 tonight Daily INR Monitor s/sx of bleeding Discharge planning: follow up closely with anticoag clinic, especially after rifampin ends 6/3   Cody Velez, PharmD PGY1 Pharmacy Resident Phone: [phone] After 3:30PM please call [place] 04/30/2018,9:55 AM

## 2018-05-01 ENCOUNTER — Inpatient Hospital Stay (HOSPITAL_COMMUNITY): Payer: Medicare Other | Admitting: Physical Therapy

## 2018-05-01 ENCOUNTER — Inpatient Hospital Stay (HOSPITAL_COMMUNITY): Payer: Medicare Other | Admitting: Occupational Therapy

## 2018-05-01 LAB — PROTIME-INR
INR: 1.95
Prothrombin Time: 22.1 seconds — ABNORMAL HIGH (ref 11.4–15.2)

## 2018-05-01 MED ORDER — WARFARIN SODIUM 5 MG PO TABS
10.0000 mg | ORAL_TABLET | Freq: Once | ORAL | Status: AC
  Administered 2018-05-01: 10 mg via ORAL
  Filled 2018-05-01: qty 2

## 2018-05-01 NOTE — Progress Notes (Signed)
ANTICOAGULATION CONSULT NOTE - Follow Up Consult  Pharmacy Consult for warfarin Indication: DVT  No Known Allergies  Patient Measurements: Height: 6\' 3"  (190.5 cm) Weight: 269 lb 10 oz (122.3 kg) IBW/kg (Calculated) : 84.5   Vital Signs: Temp: 98.6 F (37 C) (05/13 0513) Temp Source: Oral (05/13 0513) BP: 132/77 (05/13 0513) Pulse Rate: 84 (05/13 0513)  Labs: Recent Labs    04/29/18 0444 04/30/18 0415 05/01/18 0351  HGB  --  9.2*  --   HCT  --  32.6*  --   PLT  --  572*  --   LABPROT 24.0* 24.4* 22.1*  INR 2.17 2.22 1.95    Estimated Creatinine Clearance: 116.4 mL/min (by C-G formula based on SCr of 0.82 mg/dL).   Medications:  Scheduled:  . acetaminophen  650 mg Oral TID  . amLODipine  10 mg Oral Daily  . aspirin EC  81 mg Oral Daily  . cycloSPORINE  1 drop Both Eyes BID  . donepezil  10 mg Oral QHS  . feeding supplement (ENSURE ENLIVE)  237 mL Oral BID BM  . finasteride  5 mg Oral Daily  . gabapentin  600 mg Oral TID  . latanoprost  1 drop Both Eyes QHS  . lidocaine  1 patch Transdermal Q24H  . linaclotide  145 mcg Oral QAC breakfast  . lisinopril  5 mg Oral Daily  . Melatonin  1.5 mg Oral QHS  . memantine  10 mg Oral BID  . methocarbamol  500 mg Oral QID  . pantoprazole  40 mg Oral Daily  . potassium chloride  20 mEq Oral Daily  . pravastatin  10 mg Oral QPM  . rifampin  300 mg Oral Q12H  . senna-docusate  2 tablet Oral QHS  . traZODone  100 mg Oral QHS  . venlafaxine XR  150 mg Oral BID  . vitamin B-12  1,000 mcg Oral Daily  . Warfarin - Pharmacist Dosing Inpatient   Does not apply q1800    Assessment: 71 YOM transferred to CIR on 4/11 with MSSA bacteremia currently being treated with cefazolin and rifampin through 05/22/18 (per ID recs). DVT noted on doppler 4/12, decision to anticoagulate is complicated by rifampin drug interaction with all oral anticoagulants. Full anticoagulation with warfarin and lovenox bridge began 5/9. High doses of warfarin  were used initially to overcome interaction with rifampin - INR was therapeutic after only 2 doses. Patient and family were educated on warfarin monitoring and advised to follow up within 1-2 weeks of completing rifampin, as warfarin will need to be adjusted. PO intake good at 100%. INR is subtherapeutic today at 1.95. HGB is low but stable, PLT are elevated, no bleeding noted.   Goal of Therapy:  INR 2-3 Monitor platelets by anticoagulation protocol: Yes   Plan:  Warfarin 10 mg PO x 1 tonight Monitor daily INR, CBC, clinical course, s/sx of bleed, PO intake, DDI Discharge planning: follow up closely with anticoag clinic, especially after rifampin ends 6/3   Thank you for allowing [place] to participate in this patients care.  Jens Som, PharmD Clinical phone for 05/01/2018 from 7a-3:30p: [phone] If after 3:30p, please call main pharmacy at: [phone] 05/01/2018 1:49 PM

## 2018-05-01 NOTE — Progress Notes (Signed)
Occupational Therapy Session Note  Patient Details  Name: Cody Velez MRN: [id number] Date of Birth: 02/17/1946  Today's Date: 05/01/2018 OT Individual Time: 8295-6213 OT Individual Time Calculation (min): 87 min   Skilled Therapeutic Interventions/Progress Updates:    Pt greeted supine in bed. ADL needs met. Once EOB, pt donned shoes with extra time and instruction using reacher and shoe horn. OT assisted with donning back orthosis and then he transferred to w/c via lateral scoot with supervision. For remainder of session, focused on UB strengthening, activity tolerance, and sit<stands. Pt self propelled w/c throughout session in [facility] and when outdoors, navigating over uneven terrain and around environmental barriers. Pt requiring intermittent assist when going up/down steeper inclines. Sit<stand at back of metal bench completed with steady assist. Had pt complete mini squats in standing for LE strengthening, able to complete for 1 minute intervals without rest. Back indoors, pt stood using RW facing rooftop garden window in [employer]. Increased time and trials for successful power ups with steady assist due to fatigue. Once again, he was able to stand for 1 minute windows. Also completed w/c push ups in between trials for strengthening/endurance. Pt self propelled 100 ft up ramp, and was then escorted back to room. He was left with all needs and RN present at session exit.   Therapy Documentation Precautions:  Precautions Precautions: Fall, Back Precaution Booklet Issued: No Precaution Comments: Pt needed min cues to recall 3/3 back precautions Required Braces or Orthoses: Spinal Brace Spinal Brace: Thoracolumbosacral orthotic, Applied in sitting position Restrictions Weight Bearing Restrictions: No ADL: ADL ADL Comments: Please see functional navigator     See Function Navigator for Current Functional Status.   Therapy/Group: Individual Therapy  Camylle Whicker A  Dmarion Perfect 05/01/2018, 12:39 PM

## 2018-05-01 NOTE — Progress Notes (Addendum)
Subjective/Complaints: A little anxious over how things will be set up at home after discharge. Back pain improving. Had bm with sorbitol  ROS: Patient denies fever, rash, sore throat, blurred vision, nausea, vomiting, diarrhea, cough, shortness of breath or chest pain, joint or back pain, headache, or mood change.    Objective: Vital Signs: Blood pressure 132/77, pulse 84, temperature 98.6 F (37 C), temperature source Oral, resp. rate 19, height 6\' 3"  (1.905 m), weight 122.3 kg (269 lb 10 oz), SpO2 94 %. No results found. Results for orders placed or performed during the hospital encounter of 03/30/18 (from the past 72 hour(s))  Protime-INR     Status: Abnormal   Collection Time: 04/29/18  4:44 AM  Result Value Ref Range   Prothrombin Time 24.0 (H) 11.4 - 15.2 seconds   INR 2.17     Comment: Performed at [facility] [address] [place], [place] [address]  Glucose, capillary     Status: None   Collection Time: 04/29/18  7:29 AM  Result Value Ref Range   Glucose-Capillary 93 65 - 99 mg/dL  Protime-INR     Status: Abnormal   Collection Time: 04/30/18  4:15 AM  Result Value Ref Range   Prothrombin Time 24.4 (H) 11.4 - 15.2 seconds   INR 2.22     Comment: Performed at [facility], [place] [address] [place], [place] [address]  CBC     Status: Abnormal   Collection Time: 04/30/18  4:15 AM  Result Value Ref Range   WBC 5.9 4.0 - 10.5 K/uL   RBC 3.84 (L) 4.22 - 5.81 MIL/uL   Hemoglobin 9.2 (L) 13.0 - 17.0 g/dL   HCT 32.6 (L) 39.0 - 52.0 %   MCV 84.9 78.0 - 100.0 fL   MCH 24.0 (L) 26.0 - 34.0 pg   MCHC 28.2 (L) 30.0 - 36.0 g/dL   RDW 16.8 (H) 11.5 - 15.5 %   Platelets 572 (H) 150 - 400 K/uL    Comment: Performed at [facility], [place] [address], [place], [place] [address]  Protime-INR     Status: Abnormal   Collection Time: 05/01/18  3:51 AM  Result Value Ref Range   Prothrombin Time 22.1 (H) 11.4 - 15.2 seconds   INR 1.95     Comment: Performed at [facility] [address] [place], [place] [address]     Constitutional: No distress . Vital signs reviewed. HEENT: EOMI, oral membranes moist Neck: supple Cardiovascular: RRR without murmur. No JVD    Respiratory: CTA Bilaterally without wheezes or rales. Normal effort    GI: BS +, non-tender, non-distended  Musc: No edema or tenderness in extremities. Neuro: Alert and Oriented.  Motor: 5/5 in BUE proximal to distal RLE: hip flexion 3+/5, knee extension 3+/5, ankle dorsiflexion/plantarflexion 4/5   LLE: Hip flexion   3-/5, KE 3/5 ankle dorsiflexion/PF 3+/5 (no changes today) Skin: incision clean. PICC in place     Assessment/Plan: 1. Functional deficits secondary to paraplegia secondary to epidural abscess which require 3+ hours per day of interdisciplinary therapy in a comprehensive inpatient rehab setting. Physiatrist is providing close team supervision and 24 hour management of active medical problems listed below. Physiatrist and rehab team continue to assess barriers to discharge/monitor patient progress toward functional and medical goals. FIM: Function - Bathing Bathing activity did not occur: Refused Position: Shower Body parts bathed by patient: Right arm, Left arm, Chest, Abdomen, Front perineal area, Buttocks,  Right upper leg, Left upper leg, Right lower leg, Left lower leg, Back Body parts bathed by helper: Right lower leg, Left lower leg, Back Bathing not applicable: Buttocks, Right upper leg, Left upper leg, Right lower leg, Left lower leg, Back Assist Level: Assistive device, Touching or steadying assistance(Pt > 75%) Assistive Device Comment: LH sponge Set up : To obtain items  Function- Upper Body Dressing/Undressing Upper body dressing/undressing activity did not occur: Refused What is the patient wearing?: Pull over shirt/dress, Orthosis Pull over shirt/dress - Perfomed by patient: Put head through opening, Thread/unthread left sleeve, Thread/unthread right  sleeve, Pull shirt over trunk Pull over shirt/dress - Perfomed by helper: Pull shirt over trunk Orthosis activity level: Performed by helper Assist Level: Supervision or verbal cues Function - Lower Body Dressing/Undressing What is the patient wearing?: Underwear, Pants Position: Sitting EOB Underwear - Performed by patient: Thread/unthread right underwear leg, Thread/unthread left underwear leg Underwear - Performed by helper: Pull underwear up/down Pants- Performed by patient: Thread/unthread right pants leg, Thread/unthread left pants leg Pants- Performed by helper: Pull pants up/down Non-skid slipper socks- Performed by patient: Don/doff right sock, Don/doff left sock Non-skid slipper socks- Performed by helper: Don/doff right sock, Don/doff left sock Socks - Performed by patient: Don/doff right sock, Don/doff left sock(with sock aide) Socks - Performed by helper: Don/doff right sock, Don/doff left sock Shoes - Performed by patient: Fasten right, Fasten left Shoes - Performed by helper: Don/doff right shoe, Don/doff left shoe, Fasten right, Fasten left Assist for footwear: Partial/moderate assist Assist for lower body dressing: Assistive device(sit<stand in [place]) Assistive Device Comment: Reacher + sock aide  Function - Toileting Toileting activity did not occur: No continent bowel/bladder event Toileting steps completed by patient: Adjust clothing after toileting Toileting steps completed by helper: Adjust clothing prior to toileting, Performs perineal hygiene, Adjust clothing after toileting Toileting Assistive Devices: Grab bar or rail Assist level: Set up/obtain supplies, More than reasonable time, Touching or steadying assistance (Pt.75%)  Function - Air cabin crew transfer activity did not occur: N/A Toilet transfer assistive device: Drop arm commode Mechanical lift: Stedy Assist level to toilet: 2 helpers Assist level from toilet: 2 helpers  Function -  Chair/bed transfer Chair/bed transfer activity did not occur: N/A Chair/bed transfer method: Lateral scoot Chair/bed transfer assist level: Supervision or verbal cues Chair/bed transfer assistive device: Armrests Mechanical lift: Stedy Chair/bed transfer details: Verbal cues for precautions/safety, Verbal cues for safe use of DME/AE  Function - Locomotion: Wheelchair Will patient use wheelchair at discharge?: Yes Type: Manual Wheelchair activity did not occur: N/A Max wheelchair distance: 180 Assist Level: No help, No cues, assistive device, takes more than reasonable amount of time Wheel 50 feet with 2 turns activity did not occur: N/A Assist Level: No help, No cues, assistive device, takes more than reasonable amount of time Wheel 150 feet activity did not occur: N/A Assist Level: No help, No cues, assistive device, takes more than reasonable amount of time Turns around,maneuvers to table,bed, and toilet,negotiates 3% grade,maneuvers on rugs and over doorsills: No Function - Locomotion: Ambulation Ambulation activity did not occur: N/A Assistive device: Parallel bars, Maxi sky Max distance: 10' Assist level: Maximal assist (Pt 25 - 49%) Walk 10 feet activity did not occur: N/A Assist level: Maximal assist (Pt 25 - 49%)(with support of maxi sky) Walk 50 feet with 2 turns activity did not occur: N/A Walk 150 feet activity did not occur: N/A Walk 10 feet on uneven surfaces activity did not occur: N/A  Function - Comprehension Comprehension: Auditory Comprehension assist level: Follows complex conversation/direction with no assist  Function - Expression Expression: Verbal Expression assistive device: Communication board Expression assist level: Expresses complex ideas: With no assist  Function - Social Interaction Social Interaction assist level: Interacts appropriately with others with medication or extra time (anti-anxiety, antidepressant).  Function - Problem  Solving Problem solving assist level: Solves complex problems: Recognizes & self-corrects  Function - Memory Memory assist level: More than reasonable amount of time Patient normally able to recall (first 3 days only): Current season, Staff names and faces, That he or she is in a hospital, Location of own room  Medical Problem List and Plan: 1.Decreased functional mobilitysecondary to T10-11 osteomyelitis/epidural abscess with subsequent myelopathy/paraplegia, status post decompression and fusion T8-L3. Back brace when out of bed. Patient with history of multiple lumbar surgeries as well as spinal cord stimulator January 2019   Continue CIR 2. DVT left soleal, right peroneal DVT's:.     prophylactic doses of Lovenox started on 4/12.    Repeat dopplers with ongoing DVT's in bilateral peroneal   and left soleal   -coumadin for rx---INR almost therapeutic      -no cross covg necessary     -recheck dopplers at outpt 3. Pain Management:Neurontin 600 mg 3 times daily, Robaxin   as needed   Lidoderm patch     -scheduled tylenol  -oxycodone prn.--discussed decreasing use of oxy when possible 4. Mood:Aricept 10 mg nightly, Namenda 10 mg twice daily, Effexor 150 mg twice daily, trazodone 100 mg nightly  -STM deficits are notable  -providing regular positive reinforcement re: prognosis  5. Neuropsych: This patientisfully capable of making decisions on hisown behalf. 6. Skin/Wound Care:staples out per NS 7. Fluids/Electrolytes/Nutrition:Routine I/O's  -supplementing potassium KCl 20 mEq/day.    Potassium level 4.1 on 5/10   8.ID/MSSA bacteremia.Intravenous Ancef 2 g every 8 hours through 05/22/2018 and stop as well as rifampin 300 mg twice daily.    -outpt Follow-up per infectious disease  -pt/friend need education on IV abx admin at home 9.Acute on chronic anemia.     Hemoglobin  9.0 on 4/30, repeat 9.7 5/6, 9.5 on 5/9  -hemorrhoid cream prn  10.Hypertension. Norvasc 10  mg daily   Vitals:   04/30/18 2146 05/01/18 0513  BP: (!) 143/85 132/77  Pulse: 87 84  Resp: 18 19  Temp: 98.7 F (37.1 C) 98.6 F (37 C)  SpO2: 97% 94%    Borderline DBP's.  Introduced prinivil 5mg  daily with some improvement.  Consider further increase to 10 mg   (on losartan PTA) 11.Hyperlipidemia. Pravachol 12.History of prostate cancer. Proscar 5 mg daily 13.Constipation.     -improvement with linzess  -resumed senna-s  -sorbitol with results  -fleet enema prn  -had a red stool again this week, likely due to hemorrhoids 14. Urine retention, occasional incontinence seems to be improved  ---Ua negative, ucx negative 15. Morbid obesity   Body mass index is 33.7 kg/m.  Diet and exercise education  Encourage weight loss to increase endurance and promote overall health 16. Sleep disturbance  Melatonin added to trazodone on 5/5 with good results.   Mobility Assessment  The patient was seen today for the purpose of a mobility assessment for a wheelchair. I have reviewed and agree with the detailed PT evaluation. This patient suffers from paraplegia related to lumbar myelopathy. Due to paraplegia, this patient is unable to utilize a cane or walker. The patient is appropriate for a customized manual wheelchair.  Specifically, the patient requires ultra lightweight manual  wheelchair.  With this wheelchair the patient can move independently at a household level and in the community. The chair will also allow the patient to perform ADL's more easily. The patient is competent to operate the recommended chair on his own and is motivated to utilize the chair on a daily basis.     [name], MD, [place] (Days) 70 A FACE TO [place] 05/01/2018, 8:35 AM

## 2018-05-01 NOTE — Progress Notes (Signed)
Patient's CPAP is at the bedside and ready for use.

## 2018-05-01 NOTE — Progress Notes (Signed)
Physical Therapy Note  Patient Details  Name: Cody Velez MRN: [id number] Date of Birth: 1946-03-23 Today's Date: 05/01/2018    Time: 830-925 55 minutes  1:1 No c/o pain.  Lateral transfers throughout session with supervision to level surfaces.  Sit to stand with RW from elevated mat x 4 with min A, pt able to stand 1-2 minutes before fatigue.  Seated stretching for hamstring, calves and hips with pt and caregiver educated on these stretches for home.  UBE for UE strength and endurance level 5 4 min fwd/4 min bkwd.  Pt propels w/c throughout unit with supervision.   Sandria Mcenroe 05/01/2018, 9:26 AM

## 2018-05-01 NOTE — Progress Notes (Signed)
At 2000, large continent BM. At 0530, incontinent BM. Patient unaware he was incontinent. Cody Velez A

## 2018-05-01 NOTE — Progress Notes (Signed)
Physical Therapy Session Note  Patient Details  Name: Cody Velez MRN: [id number] Date of Birth: [birth year]  Today's Date: 05/01/2018 PT Individual Time: 1300-1425 PT Individual Time Calculation (min): 85 min   Short Term Goals: Week 4:  PT Short Term Goal 1 (Week 4): Pt will tolerate 30 seconds of static stance with BUE for caregiver to assist with LB dressing/ADLs.  PT Short Term Goal 2 (Week 4): Pt will complete slide board transfer with assist for board placement and supervision for transfer PT Short Term Goal 3 (Week 4): Pt will recall 2 methods of pressure relief while in w/c with min cues PT Short Term Goal 4 (Week 4): Pt will be able to direct caregiver for donning TLSO with min question cues.   Skilled Therapeutic Interventions/Progress Updates:    no c/o pain at rest, but c/o 9/10 pain by end of session in back.  Session focus on LE strengthening/activity tolerance, sit<>stands and pre-gait.    Pt requires supervision for supine>sit using bed rails.  Dons shoes with modified shoe horn and increased time with some assist from PT to stabilize shoes.  Lateral scoot transfer in/out of w/c throughout session with supervision and PT stabilizing w/c.  LE strengthening with 3# ankle weights bilaterally: LAQ focus on controlled descent, hip flexion focus on motor control.  Pt completes 2 trials of each exercise to fatigue with rest breaks in between.  With increasing amount of time sitting edge of mat without back support, pt reporting increase in pain in back.  UE stretching posterior shoulder, triceps, scalenes, and traps.  Pt with decreased cervical ROM noted and states he felt stiff.  Provided moist heat x10 minutes with improvement in mobility.  Educated pt on continuing to perform cervical ROM while in room in bed or in w/c.  Sit<>stand x3 from edge of mat with steady assist and RW.  First trial pt completes minisquats and weight shifts R<>L, second trial marching in place, 3rd trial  stand/pivot with max assist and 2nd therapist present for safety.  Pt able to tolerate standing ~1 minute each trial with significant improvement in midline orientation and LE endurance.  Returned to room in w/c at end of session, back to bed in same manner as above.  Positioned side lying on R with call bell in reach and needs met.   Therapy Documentation Precautions:  Precautions Precautions: Fall, Back Precaution Booklet Issued: No Precaution Comments: Pt needed min cues to recall 3/3 back precautions Required Braces or Orthoses: Spinal Brace Spinal Brace: Thoracolumbosacral orthotic, Applied in sitting position Restrictions Weight Bearing Restrictions: No   See Function Navigator for Current Functional Status.   Therapy/Group: Individual Therapy  Michel Santee 05/01/2018, 3:29 PM

## 2018-05-02 ENCOUNTER — Inpatient Hospital Stay (HOSPITAL_COMMUNITY): Payer: Medicare Other | Admitting: Physical Therapy

## 2018-05-02 ENCOUNTER — Inpatient Hospital Stay (HOSPITAL_COMMUNITY): Payer: Medicare Other

## 2018-05-02 LAB — PROTIME-INR
INR: 1.99
Prothrombin Time: 22.5 seconds — ABNORMAL HIGH (ref 11.4–15.2)

## 2018-05-02 MED ORDER — LINACLOTIDE 145 MCG PO CAPS
290.0000 ug | ORAL_CAPSULE | Freq: Every day | ORAL | Status: DC
  Administered 2018-05-03 – 2018-05-06 (×4): 290 ug via ORAL
  Filled 2018-05-02 (×5): qty 2

## 2018-05-02 MED ORDER — WARFARIN SODIUM 5 MG PO TABS
10.0000 mg | ORAL_TABLET | Freq: Once | ORAL | Status: AC
Start: 2018-05-02 — End: 2018-05-02
  Administered 2018-05-02: 10 mg via ORAL
  Filled 2018-05-02: qty 2

## 2018-05-02 NOTE — Progress Notes (Signed)
Patient's CPAP is set up and ready for use. 

## 2018-05-02 NOTE — Progress Notes (Addendum)
Physical Therapy Session Note  Patient Details  Name: Cody Velez MRN: [id number] Date of Birth: 04/22/1946  Today's Date: 05/02/2018 PT Individual Time: 0800-0900 AND 1300-1410 PT Individual Time Calculation (min): 60 min AND 70 min  Short Term Goals: Week 4:  PT Short Term Goal 1 (Week 4): Pt will tolerate 30 seconds of static stance with BUE for caregiver to assist with LB dressing/ADLs.  PT Short Term Goal 2 (Week 4): Pt will complete slide board transfer with assist for board placement and supervision for transfer PT Short Term Goal 3 (Week 4): Pt will recall 2 methods of pressure relief while in w/c with min cues PT Short Term Goal 4 (Week 4): Pt will be able to direct caregiver for donning TLSO with min question cues.   Skilled Therapeutic Interventions/Progress Updates:   Session 1:  Pt in w/c and agreeable to therapy, denies pain at rest. RN present finishing w/ medications. Session focused on LE NMR w/ emphasis on activation of posterior chain musculature and reciprocal movement pattern. Pt self-propelled his w/c alternating LEs and UEs at different points in the session. However, placed emphasis on using LEs, min verbal and visual cues for technique, able to go 75' at one time in longest bout but increased time required. Performed alternating hamstring curls w/ washcloths on floor, 2x20, and resisted hamstring slides w/ orange theraband, 2x10. Performed Kinetron in sitting, 3 min bouts x3 at 50 cm/sec. Returned to room and ended session in w/c and in care of wife, all needs met.   Session 2:  Pt in w/c and agreeable to therapy, pain 4-5/10 in back. Pt self-propelled w/c to/from therapy gym w/ BUEs for UE strengthening and endurance. Session focused on sit<>stands, standing tolerance/balance, and stand pivot transfers w/ RW. Performed all sit<>stands w/ min guard to close supervision for safety w/ mirror visual cues for feedback for foot placement. Performed unilateral UE tasks to  work on balance including hanging horseshoes at various heights and tossing bean bags. Min assist for standing balance w/ unilateral UE support on RW, able to safely tolerate standing 10-20 sec at a time w/ just 1 UE support. Occasional total assist to return to sitting as L knee had 3-4 episodes of buckling with fatigue, pt corrects by returning to sitting w/ assist from therapist to pull buttocks back into sitting. Returned to room and ended session in w/c, call bell within reach and all needs met. Performed UE strengthening task during seated rest breaks w/ 1# dowel rod tossing beach ball back and forth. Returned to room in w/c, ended session in w/c, call bell within reach and all needs met.   Therapy Documentation Precautions:  Precautions Precautions: Fall, Back Precaution Booklet Issued: No Precaution Comments: Pt needed min cues to recall 3/3 back precautions Required Braces or Orthoses: Spinal Brace Spinal Brace: Thoracolumbosacral orthotic, Applied in sitting position Restrictions Weight Bearing Restrictions: No Pain: Pain Assessment Pain Scale: 0-10 Pain Score: 3  Pain Intervention(s): Medication (See eMAR)(scheduled pain medication)  See Function Navigator for Current Functional Status.   Therapy/Group: Individual Therapy  [name] K Arnette 05/02/2018, 12:07 PM

## 2018-05-02 NOTE — Progress Notes (Signed)
Occupational Therapy Session Note  Patient Details  Name: Cody Velez MRN: [id number] Date of Birth: 10-23-1946  Today's Date: 05/02/2018 OT Individual Time: 1030-1130 OT Individual Time Calculation (min): 60 min    Short Term Goals: Week 5:  OT Short Term Goal 1 (Week 5): STG=LTG secondary to ELOS  Skilled Therapeutic Interventions/Progress Updates:    Pt received in supine easily awoken with c/o pain as described below. Pt transitioned to EOB with use of bed features and (S) overall. Pt donned TLSO with mod A, giving proper caregiver instruction. Pt donned shoes sitting EOB using various pieces of AE (reacher, shoe horn, shoe button). Pt required min A to position shoes and provided with extra time to problem solve through task himself. Pt completed squat pivot to w/c with CGA and heavy support on w/c. Pt propelled to therapy gym with mod I and completed standing level functional reaching task with unilateral support on RW to challenge standing balance and tolerance. Pt experienced 1 posterior LOB during L reaching. Pt returned to room and left in w/c with all needs met.    Therapy Documentation Precautions:  Precautions Precautions: Fall, Back Precaution Booklet Issued: No Precaution Comments: Pt needed min cues to recall 3/3 back precautions Required Braces or Orthoses: Spinal Brace Spinal Brace: Thoracolumbosacral orthotic, Applied in sitting position Restrictions Weight Bearing Restrictions: No   Pain: Pain Assessment Pain Scale: 0-10 Pain Score: 3  Pain Type: Acute pain;Surgical pain Pain Location: Back Pain Orientation: Mid Pain Descriptors / Indicators: Aching Pain Onset: On-going Pain Intervention(s): RN made aware;Repositioned ADL: ADL ADL Comments: Please see functional navigator  See Function Navigator for Current Functional Status.   Therapy/Group: Individual Therapy  Curtis Sites 05/02/2018, 12:32 PM

## 2018-05-02 NOTE — Progress Notes (Signed)
Nutrition Follow-up  DOCUMENTATION CODES:   Obesity unspecified  INTERVENTION:  Continue Ensure Enlive po BID, each supplement provides 350 kcal and 20 grams of protein.  Encourage adequate PO intake.   NUTRITION DIAGNOSIS:   Inadequate oral intake related to poor appetite as evidenced by per patient/family report; improved  GOAL:   Patient will meet greater than or equal to 90% of their needs; met  MONITOR:   PO intake, Supplement acceptance, Labs, Weight trends, I & O's, Skin  REASON FOR ASSESSMENT:   Consult Poor PO  ASSESSMENT:   72 y.o. right-handed male with history of hypertension, memory deficits maintained on Aricept and Namenda, prostate cancer, PTSD, chronic back pain with multiple lumbar surgeries with recent spinal cord stimulator placement January 2019 as well as treatment for recent UTI.  Presented 03/16/2018 with lethargy/fall as well as hypoxemia with paraparesis.  X-rays and imaging revealed osteomyelitis T10-T11 with epidural abscess. Patient underwent laminectomy T10-T11 decompression of epidural abscess debridement of discitis T10-T11 with segmental fixation from T8-L3 03/28/2018.  Meal completion has been 75-100%. Pt with currently has Ensure ordered with varied consumption. Plans for discharge 5/18. RD to continue with current orders to aid in caloric and protein needs. Recommend continuation of supplements post discharge especially if po intake decreases.  Labs and medications reviewed.   Diet Order:   Diet Order           Diet regular Room service appropriate? Yes; Fluid consistency: Thin  Diet effective now          EDUCATION NEEDS:   No education needs have been identified at this time  Skin:  Skin Assessment: Skin Integrity Issues: Skin Integrity Issues:: Stage II Stage II: coccyx  Last BM:  5/14  Height:   Ht Readings from Last 1 Encounters:  04/15/18 _0  (1.905 m)    Weight:   Wt Readings from Last 1 Encounters:  05/02/18 268  lb 8.3 oz (121.8 kg)    Ideal Body Weight:  89 kg  BMI:  Body mass index is 33.56 kg/m.  Estimated Nutritional Needs:   Kcal:  2150-2350  Protein:  105-120 grams  Fluid:  >/= 2.1 L/day    Corrin Parker, MS, RD, LDN Pager # [phone] After hours/ weekend pager # [phone]

## 2018-05-02 NOTE — Progress Notes (Signed)
Subjective/Complaints: Having more pain in his left shoulder this morning after some activities in therapy. Back pain reasonable/improving. Had bm yesterday  ROS: Patient denies fever, rash, sore throat, blurred vision, nausea, vomiting, diarrhea, cough, shortness of breath or chest pain, joint or back pain, headache, or mood change.     Objective: Vital Signs: Blood pressure 114/67, pulse 74, temperature 98.4 F (36.9 C), temperature source Oral, resp. rate 18, height 6\' 3"  (1.905 m), weight 121.8 kg (268 lb 8.3 oz), SpO2 98 %. No results found. Results for orders placed or performed during the hospital encounter of 03/30/18 (from the past 72 hour(s))  Protime-INR     Status: Abnormal   Collection Time: 04/30/18  4:15 AM  Result Value Ref Range   Prothrombin Time 24.4 (H) 11.4 - 15.2 seconds   INR 2.22     Comment: Performed at [facility] [address] [place], [place] [address]  CBC     Status: Abnormal   Collection Time: 04/30/18  4:15 AM  Result Value Ref Range   WBC 5.9 4.0 - 10.5 K/uL   RBC 3.84 (L) 4.22 - 5.81 MIL/uL   Hemoglobin 9.2 (L) 13.0 - 17.0 g/dL   HCT 32.6 (L) 39.0 - 52.0 %   MCV 84.9 78.0 - 100.0 fL   MCH 24.0 (L) 26.0 - 34.0 pg   MCHC 28.2 (L) 30.0 - 36.0 g/dL   RDW 16.8 (H) 11.5 - 15.5 %   Platelets 572 (H) 150 - 400 K/uL    Comment: Performed at [facility], [place] [address], [place], [place] [address]  Protime-INR     Status: Abnormal   Collection Time: 05/01/18  3:51 AM  Result Value Ref Range   Prothrombin Time 22.1 (H) 11.4 - 15.2 seconds   INR 1.95     Comment: Performed at [facility] [address] [place], [place] [address]  Protime-INR     Status: Abnormal   Collection Time: 05/02/18  4:42 AM  Result Value Ref Range   Prothrombin Time 22.5 (H) 11.4 - 15.2 seconds   INR 1.99     Comment: Performed at [facility] [address] [place], [place] [address]     Constitutional: No distress . Vital signs reviewed.  obese HEENT: EOMI, oral membranes moist Neck: supple Cardiovascular: RRR without murmur. No JVD    Respiratory: CTA Bilaterally without wheezes or rales. Normal effort    GI: BS +, non-tender, non-distended  Musc: left shoulder tender with IR/ER Neuro: Alert and Oriented.  Motor: 5/5 in BUE proximal to distal RLE: hip flexion 3+/5, knee extension 3+/5, ankle dorsiflexion/plantarflexion 4/5   LLE: Hip flexion   3-/5, KE 3/5 ankle dorsiflexion/PF 3+/5 (no changes today) Skin: incision clean. PICC in place     Assessment/Plan: 1. Functional deficits secondary to paraplegia secondary to epidural abscess which require 3+ hours per day of interdisciplinary therapy in a comprehensive inpatient rehab setting. Physiatrist is providing close team supervision and 24 hour management of active medical problems listed below. Physiatrist and rehab team continue to assess barriers to discharge/monitor patient progress toward functional and medical goals. FIM: Function - Bathing Bathing activity did not occur: Refused Position: Shower Body parts bathed by patient: Right arm, Left arm, Chest, Abdomen, Front perineal area, Buttocks, Right upper leg, Left upper leg, Right lower leg, Left lower leg, Back Body parts bathed by helper: Right lower leg, Left lower leg, Back Bathing not applicable: Buttocks, Right  upper leg, Left upper leg, Right lower leg, Left lower leg, Back Assist Level: Assistive device, Touching or steadying assistance(Pt > 75%) Assistive Device Comment: LH sponge Set up : To obtain items  Function- Upper Body Dressing/Undressing Upper body dressing/undressing activity did not occur: Refused What is the patient wearing?: Orthosis Pull over shirt/dress - Perfomed by patient: Put head through opening, Thread/unthread left sleeve, Thread/unthread right sleeve, Pull shirt over trunk Pull over shirt/dress - Perfomed by helper: Pull shirt over trunk Orthosis activity level: Performed by  helper Assist Level: Supervision or verbal cues Function - Lower Body Dressing/Undressing What is the patient wearing?: Shoes Position: Sitting EOB Underwear - Performed by patient: Thread/unthread right underwear leg, Thread/unthread left underwear leg Underwear - Performed by helper: Pull underwear up/down Pants- Performed by patient: Thread/unthread right pants leg, Thread/unthread left pants leg Pants- Performed by helper: Pull pants up/down Non-skid slipper socks- Performed by patient: Don/doff right sock, Don/doff left sock Non-skid slipper socks- Performed by helper: Don/doff right sock, Don/doff left sock Socks - Performed by patient: Don/doff right sock, Don/doff left sock(with sock aide) Socks - Performed by helper: Don/doff right sock, Don/doff left sock Shoes - Performed by patient: Fasten right, Fasten left, Don/doff right shoe, Don/doff left shoe Shoes - Performed by helper: Don/doff right shoe, Don/doff left shoe, Fasten right, Fasten left Assist for footwear: Partial/moderate assist Assist for lower body dressing: Assistive device Assistive Device Comment: Shoe horn + reacher  Function - Toileting Toileting activity did not occur: No continent bowel/bladder event Toileting steps completed by patient: Adjust clothing after toileting Toileting steps completed by helper: Adjust clothing prior to toileting, Performs perineal hygiene, Adjust clothing after toileting Toileting Assistive Devices: Grab bar or rail Assist level: Set up/obtain supplies, More than reasonable time, Touching or steadying assistance (Pt.75%)  Function - Air cabin crew transfer activity did not occur: N/A Toilet transfer assistive device: Drop arm commode Mechanical lift: Stedy Assist level to toilet: 2 helpers Assist level from toilet: 2 helpers  Function - Chair/bed transfer Chair/bed transfer activity did not occur: N/A Chair/bed transfer method: Lateral scoot Chair/bed transfer  assist level: Supervision or verbal cues Chair/bed transfer assistive device: Armrests Mechanical lift: Stedy Chair/bed transfer details: Verbal cues for precautions/safety, Verbal cues for safe use of DME/AE  Function - Locomotion: Wheelchair Will patient use wheelchair at discharge?: Yes Type: Manual Wheelchair activity did not occur: N/A Max wheelchair distance: 180 Assist Level: No help, No cues, assistive device, takes more than reasonable amount of time Wheel 50 feet with 2 turns activity did not occur: N/A Assist Level: No help, No cues, assistive device, takes more than reasonable amount of time Wheel 150 feet activity did not occur: N/A Assist Level: No help, No cues, assistive device, takes more than reasonable amount of time Turns around,maneuvers to table,bed, and toilet,negotiates 3% grade,maneuvers on rugs and over doorsills: No Function - Locomotion: Ambulation Ambulation activity did not occur: N/A Assistive device: Parallel bars, Maxi sky Max distance: 10' Assist level: Maximal assist (Pt 25 - 49%) Walk 10 feet activity did not occur: N/A Assist level: Maximal assist (Pt 25 - 49%)(with support of maxi sky) Walk 50 feet with 2 turns activity did not occur: N/A Walk 150 feet activity did not occur: N/A Walk 10 feet on uneven surfaces activity did not occur: N/A  Function - Comprehension Comprehension: Auditory Comprehension assist level: Follows complex conversation/direction with no assist  Function - Expression Expression: Verbal Expression assistive device: Communication board Expression assist level: Expresses complex  ideas: With no assist  Function - Social Interaction Social Interaction assist level: Interacts appropriately with others with medication or extra time (anti-anxiety, antidepressant).  Function - Problem Solving Problem solving assist level: Solves complex problems: Recognizes & self-corrects  Function - Memory Memory assist level: More than  reasonable amount of time Patient normally able to recall (first 3 days only): Current season, Staff names and faces, That he or she is in a hospital, Location of own room  Medical Problem List and Plan: 1.Decreased functional mobilitysecondary to T10-11 osteomyelitis/epidural abscess with subsequent myelopathy/paraplegia, status post decompression and fusion T8-L3. Back brace when out of bed. Patient with history of multiple lumbar surgeries as well as spinal cord stimulator January 2019   Continue CIR 2. DVT left soleal, right peroneal DVT's:.     prophylactic doses of Lovenox started on 4/12.    Repeat dopplers with ongoing DVT's in bilateral peroneal   and left soleal   -coumadin for rx---INR almost therapeutic      -no cross covg necessary     -recheck dopplers at outpt 3. Pain Management:Neurontin 600 mg 3 times daily, Robaxin   as needed   Lidoderm patch     -scheduled tylenol  -oxycodone prn.--discussed decreasing use of oxy when possible  -left shoulder ROM exercises, heat/sports cream. ?inject 4. Mood:Aricept 10 mg nightly, Namenda 10 mg twice daily, Effexor 150 mg twice daily, trazodone 100 mg nightly  -STM deficits are notable  -providing regular positive reinforcement re: prognosis  5. Neuropsych: This patientisfully capable of making decisions on hisown behalf. 6. Skin/Wound Care:staples out per NS 7. Fluids/Electrolytes/Nutrition:Routine I/O's  -supplementing potassium KCl 20 mEq/day.    Potassium level 4.1 on 5/10   8.ID/MSSA bacteremia.Intravenous Ancef 2 g every 8 hours through 05/22/2018 and stop as well as rifampin 300 mg twice daily.    -outpt Follow-up per infectious disease  -pt/friend need education on IV abx admin at home 9.Acute on chronic anemia.     Hemoglobin  9.0 on 4/30, repeat 9.7 5/6, 9.5 on 5/9  -hemorrhoid cream prn  10.Hypertension. Norvasc 10 mg daily   Vitals:   05/01/18 2016 05/02/18 0550  BP: 125/84 114/67  Pulse: 83 74   Resp: 16 18  Temp: 98.6 F (37 C) 98.4 F (36.9 C)  SpO2: 97% 98%    Borderline DBP's.  Introduced prinivil 5mg  daily with some improvement.  Consider further increase to 10 mg   (on losartan PTA) 11.Hyperlipidemia. Pravachol 12.History of prostate cancer. Proscar 5 mg daily 13.Constipation.     -increased linzess to 290mg   -resumed senna-s  -sorbitol with results  -fleet enema prn  -had bm yesterday  -have spoken to the patient about the fact that he will need to be proactive with bowels 14. Urine retention, occasional incontinence seems to be improved  ---Ua negative, ucx negative 15. Morbid obesity   Body mass index is 33.56 kg/m.  Diet and exercise education  Encourage weight loss to increase endurance and promote overall health 16. Sleep disturbance  Melatonin added to trazodone on 5/5 with good results.      [name], MD, [place] ([facility]) 25 A FACE TO [place] 05/02/2018, 8:34 AM

## 2018-05-02 NOTE — Progress Notes (Signed)
Social Work Patient ID: Cody Velez, male   DOB: 04/08/1946, 72 y.o.   MRN: [id number]  Have spoken with Carolynn Sayers, RN with [facility] about pt's d/c end of week and to request teaching for IV abx at home with pt and girlfriend.  She plans to meet with pt after therapies today to set up ed times and begin the process.  Tiffani Kadow, LCSW

## 2018-05-02 NOTE — Progress Notes (Signed)
Physical Therapy Session Note  Patient Details  Name: Cody Velez MRN: [id number] Date of Birth: 1946-09-28  Today's Date: 05/02/2018 PT Individual Time: 7680-8811 PT Individual Time Calculation (min): 40 min   Short Term Goals: Week 4:  PT Short Term Goal 1 (Week 4): Pt will tolerate 30 seconds of static stance with BUE for caregiver to assist with LB dressing/ADLs.  PT Short Term Goal 2 (Week 4): Pt will complete slide board transfer with assist for board placement and supervision for transfer PT Short Term Goal 3 (Week 4): Pt will recall 2 methods of pressure relief while in w/c with min cues PT Short Term Goal 4 (Week 4): Pt will be able to direct caregiver for donning TLSO with min question cues.   Skilled Therapeutic Interventions/Progress Updates:    no c/o pain.  Session focus on d/c education and planning, review of goals of therapy, and stretching/HEP review.    PT reviewed goals for supervision at home with lateral scoot transfers (slide board for car), and w/c mobility.  Discussed plan for progress with RW with PT only for now.  Also discussed outing to aquatic center this week for potential for pt to use facilities once cleared by MD.  Pt returned to bed with supervision.  Able to set up w/c without assist or cuing for transfer, and lateral scoot completed with distant supervision.  Sit>supine with supervision after pt removed TLSO.  In supine, pt completed calf stretch and hamstring stretch with leg lifter.  With hamstring stretch, pt's knees buckled so discussed completing this stretch in sitting with foot propped on stool (pt has handout on this version in addition to supine version).  Pt left positioned in bed at end of session with call bell in reach and needs met.    Therapy Documentation Precautions:  Precautions Precautions: Fall, Back Precaution Booklet Issued: No Precaution Comments: Pt needed min cues to recall 3/3 back precautions Required Braces or Orthoses:  Spinal Brace Spinal Brace: Thoracolumbosacral orthotic, Applied in sitting position Restrictions Weight Bearing Restrictions: No   See Function Navigator for Current Functional Status.   Therapy/Group: Individual Therapy  Michel Santee 05/02/2018, 3:43 PM

## 2018-05-02 NOTE — Progress Notes (Signed)
ANTICOAGULATION CONSULT NOTE - Follow Up Consult  Pharmacy Consult for warfarin Indication: DVT  No Known Allergies  Patient Measurements: Height: 6\' 3"  (190.5 cm) Weight: 268 lb 8.3 oz (121.8 kg) IBW/kg (Calculated) : 84.5   Vital Signs: Temp: 98.4 F (36.9 C) (05/14 0550) Temp Source: Oral (05/14 0550) BP: 114/67 (05/14 0550) Pulse Rate: 74 (05/14 0550)  Labs: Recent Labs    04/30/18 0415 05/01/18 0351 05/02/18 0442  HGB 9.2*  --   --   HCT 32.6*  --   --   PLT 572*  --   --   LABPROT 24.4* 22.1* 22.5*  INR 2.22 1.95 1.99    Estimated Creatinine Clearance: 116.2 mL/min (by C-G formula based on SCr of 0.82 mg/dL).   Medications:  Scheduled:  . acetaminophen  650 mg Oral TID  . amLODipine  10 mg Oral Daily  . aspirin EC  81 mg Oral Daily  . cycloSPORINE  1 drop Both Eyes BID  . donepezil  10 mg Oral QHS  . feeding supplement (ENSURE ENLIVE)  237 mL Oral BID BM  . finasteride  5 mg Oral Daily  . gabapentin  600 mg Oral TID  . latanoprost  1 drop Both Eyes QHS  . lidocaine  1 patch Transdermal Q24H  . [START ON 05/03/2018] linaclotide  290 mcg Oral QAC breakfast  . lisinopril  5 mg Oral Daily  . Melatonin  1.5 mg Oral QHS  . memantine  10 mg Oral BID  . methocarbamol  500 mg Oral QID  . pantoprazole  40 mg Oral Daily  . potassium chloride  20 mEq Oral Daily  . pravastatin  10 mg Oral QPM  . rifampin  300 mg Oral Q12H  . senna-docusate  2 tablet Oral QHS  . traZODone  100 mg Oral QHS  . venlafaxine XR  150 mg Oral BID  . vitamin B-12  1,000 mcg Oral Daily  . Warfarin - Pharmacist Dosing Inpatient   Does not apply q1800    Assessment: 71 YOM transferred to CIR on 4/11 with MSSA bacteremia currently being treated with cefazolin and rifampin through 05/22/18 (per ID recs). DVT noted on doppler 4/12, decision to anticoagulate is complicated by rifampin drug interaction with all oral anticoagulants. Full anticoagulation with warfarin and lovenox bridge began 5/9.  High doses of warfarin were used initially to overcome interaction with rifampin - INR was therapeutic after only 2 doses. Patient and family were educated on warfarin monitoring and advised to follow up within 1-2 weeks of completing rifampin, as warfarin will need to be adjusted. PO intake good at 75-100%. INR is slightly subtherapeutic today at 1.99. HGB is low but stable, PLT are elevated, no bleeding noted.  Goal of Therapy:  INR 2-3 Monitor platelets by anticoagulation protocol: Yes   Plan:  Warfarin 10 mg PO x 1 tonight Monitor daily INR, CBC, clinical course, s/sx of bleed, PO intake, DDI Discharge planning: follow up closely with anticoag clinic, especially after rifampin ends 6/3   Thank you for allowing [place] to participate in this patients care.  Jens Som, PharmD Clinical phone for 05/02/2018 from 7a-3:30p: [phone] If after 3:30p, please call main pharmacy at: [phone] 05/02/2018 11:10 AM

## 2018-05-03 ENCOUNTER — Inpatient Hospital Stay (HOSPITAL_COMMUNITY): Payer: Medicare Other | Admitting: Physical Therapy

## 2018-05-03 ENCOUNTER — Inpatient Hospital Stay (HOSPITAL_COMMUNITY): Payer: Medicare Other

## 2018-05-03 ENCOUNTER — Inpatient Hospital Stay: Payer: Federal, State, Local not specified - PPO | Admitting: Family

## 2018-05-03 ENCOUNTER — Inpatient Hospital Stay (HOSPITAL_COMMUNITY): Payer: Medicare Other | Admitting: Occupational Therapy

## 2018-05-03 LAB — CBC
HCT: 33.4 % — ABNORMAL LOW (ref 39.0–52.0)
Hemoglobin: 9.7 g/dL — ABNORMAL LOW (ref 13.0–17.0)
MCH: 24.5 pg — ABNORMAL LOW (ref 26.0–34.0)
MCHC: 29 g/dL — ABNORMAL LOW (ref 30.0–36.0)
MCV: 84.3 fL (ref 78.0–100.0)
Platelets: 591 10*3/uL — ABNORMAL HIGH (ref 150–400)
RBC: 3.96 MIL/uL — ABNORMAL LOW (ref 4.22–5.81)
RDW: 16.6 % — ABNORMAL HIGH (ref 11.5–15.5)
WBC: 6.9 10*3/uL (ref 4.0–10.5)

## 2018-05-03 LAB — PROTIME-INR
INR: 2.34
Prothrombin Time: 25.5 seconds — ABNORMAL HIGH (ref 11.4–15.2)

## 2018-05-03 MED ORDER — WARFARIN SODIUM 4 MG PO TABS
8.0000 mg | ORAL_TABLET | Freq: Once | ORAL | Status: AC
  Administered 2018-05-03: 8 mg via ORAL
  Filled 2018-05-03: qty 2

## 2018-05-03 NOTE — Progress Notes (Signed)
Social Work Patient ID: Cody Velez, male   DOB: 02-20-1946, 72 y.o.   MRN: [id number]  Met with pt and girlfriend this afternoon to review conference and discuss d/c arrangements.  Initially talked about the set up for home IV abx management.  Girlfriend quickly states, "I'm not going to do it."  Explained to pt that one other person should know how to administer even if he is doing it himself.  Suggested that girlfriend may just want to see the process just so she is familiar with it if pt needs reminders.  Pt states that one of his daughters can assist with this.  More education to be done this afternoon. Girlfriend also expressing concern and becomes a little tearful when she notes that he will decrease in therapy intensity at d/c and feels like, "He'll just lose every bit of what he's gained."  Attempted to reassure her that team feels he is ready for d/c end of week and that he shouldn't "lose it all" when he is discharged.  Stressed that he will have some "homework" of activities he needs to do at home.  She also wants pt to transition to outpatient therapy as soon as abx no longer needed - explained that Dr. Naaman Plummer will refer for this in the transitional visit. Have ordered DME and [facility] services.  Pt states he feels ready for d/c and is pleased with gains made.  Continue to follow.  Saia Derossett, LCSW

## 2018-05-03 NOTE — Progress Notes (Signed)
Physical Therapy Note  Patient Details  Name: Cody Velez MRN: [id number] Date of Birth: 08-03-1946 Today's Date: 05/03/2018  0905-1020, 75 min individual tx Pain: " I'm good".   Josh from [facility] here to deliver and fit pt in loaner w/c.  He originally removed rims from wheels for better access at home, but pt had difficulty keeping hands out of spokes during propulsion, so Josh re-installed rims.  Slide board transfer with supervision and to stabilize w/c, to R.   PT re-donned TLSO with pt, educating him about pads at sides and appropriate position when brace is snug. PT donned personal L knee brace.  Seated self -stretching with 14" ht differential between seat ht and foot height, using foot stool, x 30 seconds x 3 R/LLE. Use of leg lifter for L heel cords as well. Strengthening exs: 15 x 1 each L/R long arc quad knee extension, bil hip abduction using bright green Theraband, heel raises with focus on eccentric control. AROM cervical spine.  W/c propulsion on level tile with supervision to/from room.  Seated scooting R/L with arms across chest, focusing on trunk flexion.   Sit> stand from 23" high mat, to RW x 30 seconds, x 65 seconds, x 60 seconds.  Pt forgot to reach back with UE 2/3 trials when sitting down.  Pt performed wt shifting R><L 1st bout, mini squats 2nd bout, , heel raises x 10, 3rd bout.  Pt demonstrates heavy reliance on UEs.  Pt demonstrated improved hip extension by 3rd bout of standing.  Pt left resting in w/c with all needs within reach; he refused use of quick release belt.  See function navigator for current status.[name] 05/03/2018, 7:54 AM

## 2018-05-03 NOTE — Progress Notes (Signed)
Occupational Therapy Session Note  Patient Details  Name: Cody Velez MRN: [id number] Date of Birth: 10/22/1946  Today's Date: 05/03/2018 OT Individual Time: 1100-1200 OT Individual Time Calculation (min): 60 min    Short Term Goals: Week 2:  OT Short Term Goal 1 (Week 2): Pt will perform BSC squat pivot transfer with max A. OT Short Term Goal 1 - Progress (Week 2): Met OT Short Term Goal 2 (Week 2): Pt will thread pants over BLE sitting EOB using AE PRN with max A OT Short Term Goal 2 - Progress (Week 2): Met OT Short Term Goal 3 (Week 2): Pt will pull up pants sit<>stand in Stedy with max A OT Short Term Goal 3 - Progress (Week 2): Met Week 3:  OT Short Term Goal 1 (Week 3): Pt will transfer to [facility] with min A  OT Short Term Goal 1 - Progress (Week 3): Progressing toward goal OT Short Term Goal 2 (Week 3): Pt will complete LB bathing with min A using AE PRN OT Short Term Goal 2 - Progress (Week 3): Met OT Short Term Goal 3 (Week 3): Pt will pull pants up><down with mod A  OT Short Term Goal 3 - Progress (Week 3): Progressing toward goal OT Short Term Goal 4 (Week 3): Pt will tolerate standing for 1 minute for grooming at sink with min A  OT Short Term Goal 4 - Progress (Week 3): Progressing toward goal Week 4:  OT Short Term Goal 1 (Week 4): Pt will pull pants up><down with mod A  OT Short Term Goal 2 (Week 4): Pt will tolerate standing for 1 minute for grooming at sink with min A  OT Short Term Goal 2 - Progress (Week 4): Progressing toward goal OT Short Term Goal 3 (Week 4): Pt will transfer to [facility] with min A  OT Short Term Goal 3 - Progress (Week 4): Met  Skilled Therapeutic Interventions/Progress Updates:    1:1 Focus on toileting and toilet transfers to [facility] with pt and pt's caregiver Cody Velez. Pt can perform a squat pivot transfer drop arm BSC <>w/c with supervision with setup. Pt then has been practicing standing with RW for Cody Velez to perform clothing management.  Pt only  recommended to stand with left knee brace donned and in a quiet environment. Pt reports he can and will use the toilet aid for hygiene after BM.   Pt requested to address LE coordination, uprgiht trunk posture and control and awareness of LEs. Pt ambulated 82 feet at one time using the SARA Plus with walking sling for support. Pt required mod cues for awareness of LE positioning and placement at times. Pt demonstrated increased control of LEs in this activity with only one significant bout of buckling of knees.   Therapy Documentation Precautions:  Precautions Precautions: Fall, Back Precaution Booklet Issued: No Precaution Comments: Pt needed min cues to recall 3/3 back precautions Required Braces or Orthoses: Spinal Brace Spinal Brace: Thoracolumbosacral orthotic, Applied in sitting position Restrictions Weight Bearing Restrictions: No Pain: Pain Assessment Pain Score: 2  ADL: ADL ADL Comments: Please see functional navigator  See Function Navigator for Current Functional Status.   Therapy/Group: Individual Therapy  [facility] 05/03/2018, 1:31 PM

## 2018-05-03 NOTE — Progress Notes (Signed)
Physical Therapy Session Note  Patient Details  Name: Cody Velez MRN: [id number] Date of Birth: Oct 11, 1946  Today's Date: 05/03/2018 PT Individual Time: 6286-3817 PT Individual Time Calculation (min): 40 min   Short Term Goals: Week 4:  PT Short Term Goal 1 (Week 4): Pt will tolerate 30 seconds of static stance with BUE for caregiver to assist with LB dressing/ADLs.  PT Short Term Goal 2 (Week 4): Pt will complete slide board transfer with assist for board placement and supervision for transfer PT Short Term Goal 3 (Week 4): Pt will recall 2 methods of pressure relief while in w/c with min cues PT Short Term Goal 4 (Week 4): Pt will be able to direct caregiver for donning TLSO with min question cues.   Skilled Therapeutic Interventions/Progress Updates:   Pt received supine in bed and agreeable to PT. Supine>sit transfer with  Supervision assist and heavy use of bed rails. PT assisted pt to don TLSO in sitting. Sit<>stand from bed with min-mod assist x 4 and UE support on RW and rail of bed. Reciprocal marches with BUE support on RW 2 x 5 with BUE support. Pt required to sit following L knee instability during second bout. Attempted stand pivot transfer with RW with mod assist. Unable to complete due to R knee buckle. Squat pivot transfer to [facility] with supervision assist.  WC mobility on level surface of hospital x 141f with out assist. And Supervision assist to propel WC on unlevel cement sidewalk at hospital entrance x 2043fwith min cues for posture to prevent posterior LOB. [facility] and all needs met.          Therapy Documentation Precautions:  Precautions Precautions: Fall, Back Precaution Booklet Issued: No Precaution Comments: Pt needed min cues to recall 3/3 back precautions Required Braces or Orthoses: Spinal Brace Spinal Brace: Thoracolumbosacral orthotic, Applied in sitting position Restrictions Weight  Bearing Restrictions: No    Vital Signs: Therapy Vitals Temp: 98.5 F (36.9 C) Temp Source: Oral Pulse Rate: 80 Resp: (!) 22 BP: 124/84 Patient Position (if appropriate): Lying Oxygen Therapy SpO2: 100 % O2 Device: Room Air Pain: Pain Assessment Pain Score: 2    See Function Navigator for Current Functional Status.   Therapy/Group: Individual Therapy  AuLorie Phenix/15/2019, 5:24 PM

## 2018-05-03 NOTE — Patient Care Conference (Signed)
Inpatient RehabilitationTeam Conference and Plan of Care Update Date: 05/02/2018   Time: 2:00 PM    Patient Name: Cody Velez      Medical Record Number: [id number]  Date of Birth: 12-Jul-1946 Sex: Male         Room/Bed: 4W06C/4W06C-01 Payor Info: Payor: MEDICARE / Plan: MEDICARE PART A AND B / Product Type: *No Product type* /    Admitting Diagnosis: T 10  11 Myelopathy  Admit Date/Time:  03/30/2018  3:13 PM Admission Comments: No comment available   Primary Diagnosis:  Paraplegia ([place]) Principal Problem: Paraplegia [facility])  Patient Active Problem List   Diagnosis Date Noted  . Sleep disturbance   . Post-op pain   . Hypokalemia   . Morbid obesity (Cody Velez)   . Postoperative pain   . DVT, lower extremity, distal, acute, bilateral (Cody Velez)   . Acute blood loss anemia   . Anemia of chronic disease   . Essential hypertension   . Bacteremia   . Myelopathy ([place]) 03/30/2018  . Paraplegia ([place])   . Postlaminectomy syndrome, lumbar region   . Epidural abscess   . Abdominal distension   . Encephalopathy   . Weakness of both lower extremities   . Discitis of thoracic region   . Spinal cord stimulator status   . Staphylococcus aureus sepsis (Cody Velez) 03/20/2018  . Bacteremia due to methicillin susceptible Staphylococcus aureus (MSSA) 03/18/2018  . Obesity 03/18/2018  . Weakness 03/16/2018  . Fever 03/16/2018  . Sepsis (Cody Velez) 03/16/2018  . Nephrolithiasis 03/16/2018  . AKI (acute kidney injury) (Cody Velez) 03/16/2018  . Acute metabolic encephalopathy 89/38/1017  . Cognitive impairment 03/16/2018  . Chronic pain syndrome 03/16/2018  . B12 deficiency 01/28/2016  . Memory difficulty 07/23/2015  . History of cerebrovascular disease 07/23/2015  . FH: colon cancer 08/13/2014  . Hx of adenomatous colonic polyps 08/13/2014  . Chronic back pain 05/30/2012  . CAD, NATIVE VESSEL 07/17/2010  . HYPERCHOLESTEROLEMIA 10/31/2009  . SMOKELESS TOBACCO ABUSE 10/31/2009  . Obstructive sleep apnea 10/31/2009   . Essential hypertension, benign 10/31/2009  . GERD 10/31/2009    Expected Discharge Date: Expected Discharge Date: 05/06/18  Team Members Present: Physician leading conference: Dr. Alger Simons Social Worker Present: Lennart Pall, LCSW Nurse Present: Dorthula Nettles, RN PT Present: Canary Brim, PT;Caitlin Penven-Crew, PT OT Present: Napoleon Form, OT PPS Coordinator present : Ileana Ladd, PT     Current Status/Progress Goal Weekly Team Focus  Medical   pain improving. having some emotional struggles with prognois/recovery. remains on abx  establishing realistic goals  finalizing medical planning for discharge   Bowel/Bladder   continent of b/b LBM 05/13  remain continent of b/b regular bowel pattern  assist with toileting prn laxatives prn   Swallow/Nutrition/ Hydration             ADL's   Bathing/dressing- (S) to min A. toileting tasks- mod A  min A overall  ADL transfers, standing tolerance, toileting tasks    Mobility   lateral scoot and slide board transfers supervision/min assist level, w/c supervision  will upgrade w/c transfers to supervision, remaining goals stay the same (mod I w/c, mod assist standing)  standing tolerance for balance and NMR, transfers, self direction of care, recall, d/c planning, family education   Communication             Safety/Cognition/ Behavioral Observations            Pain   chronic back pain oxycodone prn and robaxin prn, tylenol prn  jpain 3/10  assess pain qshift and prn medicate prn   Skin   surgical incision midline back steri stips healed stage 2  remain free of infection and no new breakdown  assess skin q shift and prn    Rehab Goals Patient on target to meet rehab goals: Yes *See Care Plan and progress notes for long and short-term goals.     Barriers to Discharge  Current Status/Progress Possible Resolutions Date Resolved   Physician    Medical stability        family/pt education,       Nursing                  PT                     OT                  SLP                SW                Discharge Planning/Teaching Needs:  Pt to d/c home with girlfriend who can provide light physical assistance.  Daughter are available for intermittent support.  Teaching with therapies and nursing underway.   Team Discussion:  Still with some bowel issues;  No new significant medical issues.  Close supervision to min assist with transfers.  Propelling w/c with mod ind.  Real car tf went well.  Family ed being completed.  Revisions to Treatment Plan:  None    Continued Need for Acute Rehabilitation Level of Care: The patient requires daily medical management by a physician with specialized training in physical medicine and rehabilitation for the following conditions: Daily direction of a multidisciplinary physical rehabilitation program to ensure safe treatment while eliciting the highest outcome that is of practical value to the patient.: Yes Daily medical management of patient stability for increased activity during participation in an intensive rehabilitation regime.: Yes Daily analysis of laboratory values and/or radiology reports with any subsequent need for medication adjustment of medical intervention for : Neurological problems;Wound care problems  Syniyah Velez 05/03/2018, 11:02 AM

## 2018-05-03 NOTE — Progress Notes (Signed)
ANTICOAGULATION CONSULT NOTE - Follow Up Consult  Pharmacy Consult for warfarin Indication: DVT  No Known Allergies  Patient Measurements: Height: 6\' 3"  (190.5 cm) Weight: 272 lb 4.3 oz (123.5 kg) IBW/kg (Calculated) : 84.5   Vital Signs: Temp: 98 F (36.7 C) (05/15 0502) Temp Source: Oral (05/15 0502) BP: 149/88 (05/15 0502) Pulse Rate: 78 (05/15 0502)  Labs: Recent Labs    05/01/18 0351 05/02/18 0442 05/03/18 0454  HGB  --   --  9.7*  HCT  --   --  33.4*  PLT  --   --  591*  LABPROT 22.1* 22.5* 25.5*  INR 1.95 1.99 2.34    Estimated Creatinine Clearance: 117 mL/min (by C-G formula based on SCr of 0.82 mg/dL).   Medications:  Scheduled:  . acetaminophen  650 mg Oral TID  . amLODipine  10 mg Oral Daily  . aspirin EC  81 mg Oral Daily  . cycloSPORINE  1 drop Both Eyes BID  . donepezil  10 mg Oral QHS  . feeding supplement (ENSURE ENLIVE)  237 mL Oral BID BM  . finasteride  5 mg Oral Daily  . gabapentin  600 mg Oral TID  . latanoprost  1 drop Both Eyes QHS  . lidocaine  1 patch Transdermal Q24H  . linaclotide  290 mcg Oral QAC breakfast  . lisinopril  5 mg Oral Daily  . Melatonin  1.5 mg Oral QHS  . memantine  10 mg Oral BID  . methocarbamol  500 mg Oral QID  . pantoprazole  40 mg Oral Daily  . potassium chloride  20 mEq Oral Daily  . pravastatin  10 mg Oral QPM  . rifampin  300 mg Oral Q12H  . senna-docusate  2 tablet Oral QHS  . traZODone  100 mg Oral QHS  . venlafaxine XR  150 mg Oral BID  . vitamin B-12  1,000 mcg Oral Daily  . Warfarin - Pharmacist Dosing Inpatient   Does not apply q1800    Assessment: 71 YOM transferred to CIR on 4/11 with MSSA bacteremia currently being treated with cefazolin and rifampin through 05/22/18 (per ID recs). DVT noted on doppler 4/12, decision to anticoagulate is complicated by rifampin drug interaction with all oral anticoagulants. Full anticoagulation with warfarin and lovenox bridge began 5/9. High doses of warfarin  were used initially to overcome interaction with rifampin - INR was therapeutic after only 2 doses. Patient and family were educated on warfarin monitoring and advised to follow up within 1-2 weeks of completing rifampin, as warfarin will need to be adjusted. PO intake good at 75-100%. INR is therapeutic today at 2.34. HGB is low but stable, PLT are elevated, no bleeding noted.  Goal of Therapy:  INR 2-3 Monitor platelets by anticoagulation protocol: Yes   Plan:  Warfarin 8 mg PO x 1 tonight Monitor daily INR, CBC, clinical course, s/sx of bleed, PO intake, DDI Discharge planning: follow up closely with anticoag clinic, especially after rifampin ends 6/3   Thank you for allowing [place] to participate in this patients care.  Jens Som, PharmD Clinical phone for 05/03/2018 from 7a-3:30p: [phone] If after 3:30p, please call main pharmacy at: [phone] 05/03/2018 8:32 AM

## 2018-05-03 NOTE — Progress Notes (Signed)
Pt self places CPAP.  Machine at bedside for use.

## 2018-05-03 NOTE — Progress Notes (Signed)
Physical Therapy Session Note  Patient Details  Name: Cody Velez MRN: [id number] Date of Birth: [birth year]  Today's Date: 05/03/2018 PT Individual Time: 1300-1330 PT Individual Time Calculation (min): 30 min   Short Term Goals: Week 4:  PT Short Term Goal 1 (Week 4): Pt will tolerate 30 seconds of static stance with BUE for caregiver to assist with LB dressing/ADLs.  PT Short Term Goal 2 (Week 4): Pt will complete slide board transfer with assist for board placement and supervision for transfer PT Short Term Goal 3 (Week 4): Pt will recall 2 methods of pressure relief while in w/c with min cues PT Short Term Goal 4 (Week 4): Pt will be able to direct caregiver for donning TLSO with min question cues.   Skilled Therapeutic Interventions/Progress Updates:    Pt seated in w/c in room discussing d/c plan and plan for therapy upon d/c home with CSW and with pt's girlfriend. Educated pt that even though home health therapy and even outpatient therapy will have decreased intensity from inpatient rehab pt will be responsible for completing more on his own and work on HEP during times he is not in therapy. No complaints of pain. Sit to stand x 3 reps from w/c to RW with CGA, standing tolerance x 1 min each with weight shift L/R with significant reliance on BUE on RW for stability. During last bout of standing LLE buckles and pt needs min A to sit safely back in w/c. Lateral scoot transfer w/c to bed with Supervision. Sit to supine with Supervision with increased time for LE management. Pt left supine in bed with needs in reach, girlfriend present.  Therapy Documentation Precautions:  Precautions Precautions: Fall, Back Precaution Booklet Issued: No Precaution Comments: Pt needed min cues to recall 3/3 back precautions Required Braces or Orthoses: Spinal Brace Spinal Brace: Thoracolumbosacral orthotic, Applied in sitting position Restrictions Weight Bearing Restrictions: No  See Function  Navigator for Current Functional Status.   Therapy/Group: Individual Therapy  Excell Seltzer, PT, DPT  05/03/2018, 5:02 PM

## 2018-05-03 NOTE — Progress Notes (Signed)
Subjective/Complaints: In good spirits. No new issues. Met with HHRN about iv abx yesterday and feels good about it.   ROS: Patient denies fever, rash, sore throat, blurred vision, nausea, vomiting, diarrhea, cough, shortness of breath or chest pain, headache, or mood change.    Objective: Vital Signs: Blood pressure (!) 149/88, pulse 78, temperature 98 F (36.7 C), temperature source Oral, resp. rate 18, height '6\' 3"'  (1.905 m), weight 123.5 kg (272 lb 4.3 oz), SpO2 95 %. No results found. Results for orders placed or performed during the hospital encounter of 03/30/18 (from the past 72 hour(s))  Protime-INR     Status: Abnormal   Collection Time: 05/01/18  3:51 AM  Result Value Ref Range   Prothrombin Time 22.1 (H) 11.4 - 15.2 seconds   INR 1.95     Comment: Performed at [facility] [address], [place], [place] [address]  Protime-INR     Status: Abnormal   Collection Time: 05/02/18  4:42 AM  Result Value Ref Range   Prothrombin Time 22.5 (H) 11.4 - 15.2 seconds   INR 1.99     Comment: Performed at [facility] [address] [place], [place] [address]  Protime-INR     Status: Abnormal   Collection Time: 05/03/18  4:54 AM  Result Value Ref Range   Prothrombin Time 25.5 (H) 11.4 - 15.2 seconds   INR 2.34     Comment: Performed at [facility] [address] [place], [place] [address]  CBC     Status: Abnormal   Collection Time: 05/03/18  4:54 AM  Result Value Ref Range   WBC 6.9 4.0 - 10.5 K/uL   RBC 3.96 (L) 4.22 - 5.81 MIL/uL   Hemoglobin 9.7 (L) 13.0 - 17.0 g/dL   HCT 33.4 (L) 39.0 - 52.0 %   MCV 84.3 78.0 - 100.0 fL   MCH 24.5 (L) 26.0 - 34.0 pg   MCHC 29.0 (L) 30.0 - 36.0 g/dL   RDW 16.6 (H) 11.5 - 15.5 %   Platelets 591 (H) 150 - 400 K/uL    Comment: Performed at [facility], [place] [address], [place],  [address]     Constitutional: No distress . Vital signs reviewed. HEENT: EOMI, oral membranes moist Neck:  supple Cardiovascular: RRR without murmur. No JVD    Respiratory: CTA Bilaterally without wheezes or rales. Normal effort    GI: BS +, non-tender, non-distended  Musc: left shoulder tender with IR/ER Neuro: Alert and Oriented.  Motor: 5/5 in BUE proximal to distal RLE: hip flexion 3+/5, knee extension 3+/5, ankle dorsiflexion/plantarflexion 4/5   LLE: Hip flexion   3-/5, KE 3/5 ankle dorsiflexion/PF 3+/5 (no changes today) Skin: incision clean. PICC in place     Assessment/Plan: 1. Functional deficits secondary to paraplegia secondary to epidural abscess which require 3+ hours per day of interdisciplinary therapy in a comprehensive inpatient rehab setting. Physiatrist is providing close team supervision and 24 hour management of active medical problems listed below. Physiatrist and rehab team continue to assess barriers to discharge/monitor patient progress toward functional and medical goals. FIM: Function - Bathing Bathing activity did not occur: Refused Position: Shower Body parts bathed by patient: Right arm, Left arm, Chest, Abdomen, Front perineal area, Buttocks, Right upper leg, Left upper leg, Right lower leg, Left lower leg, Back Body parts bathed by helper: Right lower leg, Left lower leg, Back Bathing not applicable: Buttocks, Right upper leg, Left upper leg, Right  lower leg, Left lower leg, Back Assist Level: Assistive device, Touching or steadying assistance(Pt > 75%) Assistive Device Comment: LH sponge Set up : To obtain items  Function- Upper Body Dressing/Undressing Upper body dressing/undressing activity did not occur: Refused What is the patient wearing?: Orthosis Pull over shirt/dress - Perfomed by patient: Put head through opening, Thread/unthread left sleeve, Thread/unthread right sleeve, Pull shirt over trunk Pull over shirt/dress - Perfomed by helper: Pull shirt over trunk Orthosis activity level: Performed by helper Assist Level: Supervision or verbal  cues Function - Lower Body Dressing/Undressing What is the patient wearing?: Shoes Position: Sitting EOB Underwear - Performed by patient: Thread/unthread right underwear leg, Thread/unthread left underwear leg Underwear - Performed by helper: Pull underwear up/down Pants- Performed by patient: Thread/unthread right pants leg, Thread/unthread left pants leg Pants- Performed by helper: Pull pants up/down Non-skid slipper socks- Performed by patient: Don/doff right sock, Don/doff left sock Non-skid slipper socks- Performed by helper: Don/doff right sock, Don/doff left sock Socks - Performed by patient: Don/doff right sock, Don/doff left sock(with sock aide) Socks - Performed by helper: Don/doff right sock, Don/doff left sock Shoes - Performed by patient: Fasten right, Fasten left, Don/doff right shoe, Don/doff left shoe Shoes - Performed by helper: Don/doff right shoe, Don/doff left shoe, Fasten right, Fasten left Assist for footwear: Partial/moderate assist Assist for lower body dressing: Assistive device Assistive Device Comment: Shoe horn + reacher  Function - Toileting Toileting activity did not occur: No continent bowel/bladder event Toileting steps completed by patient: Adjust clothing after toileting Toileting steps completed by helper: Adjust clothing prior to toileting, Performs perineal hygiene, Adjust clothing after toileting Toileting Assistive Devices: Grab bar or rail Assist level: Set up/obtain supplies, More than reasonable time, Touching or steadying assistance (Pt.75%)  Function - Air cabin crew transfer activity did not occur: N/A Toilet transfer assistive device: Drop arm commode Mechanical lift: Stedy Assist level to toilet: 2 helpers Assist level from toilet: 2 helpers  Function - Chair/bed transfer Chair/bed transfer activity did not occur: N/A Chair/bed transfer method: Lateral scoot Chair/bed transfer assist level: Supervision or verbal  cues Chair/bed transfer assistive device: Armrests Mechanical lift: Stedy Chair/bed transfer details: Verbal cues for precautions/safety, Verbal cues for safe use of DME/AE  Function - Locomotion: Wheelchair Will patient use wheelchair at discharge?: Yes Type: Manual Wheelchair activity did not occur: N/A Max wheelchair distance: 200' Assist Level: Supervision or verbal cues Wheel 50 feet with 2 turns activity did not occur: N/A Assist Level: Supervision or verbal cues Wheel 150 feet activity did not occur: N/A Assist Level: Supervision or verbal cues Turns around,maneuvers to table,bed, and toilet,negotiates 3% grade,maneuvers on rugs and over doorsills: No Function - Locomotion: Ambulation Ambulation activity did not occur: N/A Assistive device: Parallel bars, Maxi sky Max distance: 10' Assist level: Maximal assist (Pt 25 - 49%) Walk 10 feet activity did not occur: N/A Assist level: Maximal assist (Pt 25 - 49%)(with support of maxi sky) Walk 50 feet with 2 turns activity did not occur: N/A Walk 150 feet activity did not occur: N/A Walk 10 feet on uneven surfaces activity did not occur: N/A  Function - Comprehension Comprehension: Auditory Comprehension assist level: Follows complex conversation/direction with no assist  Function - Expression Expression: Verbal Expression assistive device: Communication board Expression assist level: Expresses complex ideas: With no assist  Function - Social Interaction Social Interaction assist level: Interacts appropriately with others with medication or extra time (anti-anxiety, antidepressant).  Function - Problem Solving Problem solving assist level:  Solves complex problems: Recognizes & self-corrects  Function - Memory Memory assist level: More than reasonable amount of time Patient normally able to recall (first 3 days only): Current season, Staff names and faces, That he or she is in a hospital, Location of own room  Medical  Problem List and Plan: 1.Decreased functional mobilitysecondary to T10-11 osteomyelitis/epidural abscess with subsequent myelopathy/paraplegia, status post decompression and fusion T8-L3. Back brace when out of bed. Patient with history of multiple lumbar surgeries as well as spinal cord stimulator January 2019   Continue CIR  -working toward 5/18 dc 2. DVT left soleal, right peroneal DVT's:.          Repeat dopplers with ongoing DVT's in bilateral peroneal   and left soleal   -coumadin for rx---INR   therapeutic      -no cross covg necessary     -recheck dopplers at outpt 3. Pain Management:Neurontin 600 mg 3 times daily, Robaxin   as needed   Lidoderm patch     -scheduled tylenol  -oxycodone prn.--discussed decreasing use of oxy when possible  -left shoulder ROM exercises, heat/sports cream. ?inject 4. Mood:Aricept 10 mg nightly, Namenda 10 mg twice daily, Effexor 150 mg twice daily, trazodone 100 mg nightly  -STM deficits are notable  -providing regular positive reinforcement re: prognosis  5. Neuropsych: This patientisfully capable of making decisions on hisown behalf. 6. Skin/Wound Care:staples out per NS 7. Fluids/Electrolytes/Nutrition:Routine I/O's  -supplementing potassium KCl 20 mEq/day.    Potassium level 4.1 on 5/1   8.ID/MSSA bacteremia.Intravenous Ancef 2 g every 8 hours through 05/22/2018 and stop as well as rifampin 300 mg twice daily.    -outpt Follow-up per infectious disease  -pt/friend need education on IV abx admin at home 9.Acute on chronic anemia.     Hemoglobin  9.0 on 4/30, repeat 9.7 5/15  -hemorrhoid cream prn  10.Hypertension. Norvasc 10 mg daily   Vitals:   05/02/18 2032 05/03/18 0502  BP: 134/77 (!) 149/88  Pulse: 82 78  Resp: 18 18  Temp: 98.3 F (36.8 C) 98 F (36.7 C)  SpO2: 97% 95%    Borderline DBP's.  Introduced prinivil 96m daily with some improvement.  Consider further increase to 10 mg   (on losartan  PTA) 11.Hyperlipidemia. Pravachol 12.History of prostate cancer. Proscar 5 mg daily 13.Constipation.     -increased linzess to 2973m -resumed senna-s  -sorbitol with results  -fleet enema prn  -had bm yesterday  -have spoken to the patient about the fact that he will need to be proactive with bowels 14. Urine retention, occasional incontinence seems to be improved  ---Ua negative, ucx negative 15. Morbid obesity   Body mass index is 34.03 kg/m.  Diet and exercise education  Encourage weight loss to increase endurance and promote overall health 16. Sleep disturbance  Melatonin added to trazodone on 5/5 with good results.      ZaMeredith StaggersMD, FAMcConnellsDays) [id number] FAAleutians West/15/2019, 8:44 AM

## 2018-05-04 ENCOUNTER — Inpatient Hospital Stay (HOSPITAL_COMMUNITY): Payer: Medicare Other | Admitting: Occupational Therapy

## 2018-05-04 ENCOUNTER — Inpatient Hospital Stay (HOSPITAL_COMMUNITY): Payer: Medicare Other | Admitting: Physical Therapy

## 2018-05-04 ENCOUNTER — Encounter (HOSPITAL_COMMUNITY): Payer: Medicare Other | Admitting: *Deleted

## 2018-05-04 LAB — PROTIME-INR
INR: 2.35
Prothrombin Time: 25.5 seconds — ABNORMAL HIGH (ref 11.4–15.2)

## 2018-05-04 MED ORDER — WARFARIN SODIUM 7.5 MG PO TABS
7.5000 mg | ORAL_TABLET | Freq: Every day | ORAL | Status: DC
  Administered 2018-05-04: 7.5 mg via ORAL
  Filled 2018-05-04: qty 1

## 2018-05-04 MED ORDER — WARFARIN SODIUM 7.5 MG PO TABS: 7.5000 mg | ORAL_TABLET | Freq: Once | ORAL | Status: DC

## 2018-05-04 NOTE — Progress Notes (Signed)
Physical Therapy Weekly Progress Note  Patient Details  Name: Cody Velez MRN: [id number] Date of Birth: 1946-02-18  Beginning of progress report period: Apr 24, 2018 End of progress report period: May 04, 2018  Today's Date: 05/04/2018 PT Individual Time: 0930-1100 PT Individual Time Calculation (min): 90 min   Patient has met 4 of 4 short term goals.  Pt continues to make progress with PT and demonstrates improvement with LE endurance in standing.  Continue to work toward d/c at lateral scoot and w/c level with expectation that pt should progress to standing/ambulatory with HHPT/OPPT in the near future.  Patient continues to demonstrate the following deficits muscle weakness, impaired timing and sequencing, abnormal tone, unbalanced muscle activation and decreased coordination, decreased memory and decreased standing balance, decreased postural control and decreased balance strategies and therefore will continue to benefit from skilled PT intervention to increase functional independence with mobility.  Patient progressing toward long term goals..  Continue plan of care.  PT Short Term Goals Week 4:  PT Short Term Goal 1 (Week 4): Pt will tolerate 30 seconds of static stance with BUE for caregiver to assist with LB dressing/ADLs.  PT Short Term Goal 1 - Progress (Week 4): Met PT Short Term Goal 2 (Week 4): Pt will complete slide board transfer with assist for board placement and supervision for transfer PT Short Term Goal 2 - Progress (Week 4): Met PT Short Term Goal 3 (Week 4): Pt will recall 2 methods of pressure relief while in w/c with min cues PT Short Term Goal 3 - Progress (Week 4): Met PT Short Term Goal 4 (Week 4): Pt will be able to direct caregiver for donning TLSO with min question cues.  PT Short Term Goal 4 - Progress (Week 4): Met Week 5:  PT Short Term Goal 1 (Week 5): =LTGs due to ELOS  Skilled Therapeutic Interventions/Progress Updates:    no c/o pain at start of  session but noted to have increase in pain with travel in therapy van.  RN notified at end of session.  Pt participated in community outing to [employer] [facility] to determine possibility as a Data processing manager for continued therapy at d/c.  Pt able to negotiate community setting from w/c level with occasional supervision to negotiate curb cuts and for attention to change in surface grade.  Pt returned to room at end of session and positioned in bed with set up assist for w/c positioning for lateral scoot transfer.  Pt positioned in supine with call bell in reach and needs met.   Therapy Documentation Precautions:  Precautions Precautions: Fall, Back Precaution Booklet Issued: No Precaution Comments: Pt needed min cues to recall 3/3 back precautions Required Braces or Orthoses: Spinal Brace Spinal Brace: Thoracolumbosacral orthotic, Applied in sitting position Restrictions Weight Bearing Restrictions: No   See Function Navigator for Current Functional Status.  Therapy/Group: Individual Therapy  Michel Santee 05/04/2018, 11:36 AM

## 2018-05-04 NOTE — Progress Notes (Signed)
Patient has home CPAP unit at bedside. RT filled water chamber with sterile water. Patient places self on and off when ready.

## 2018-05-04 NOTE — Progress Notes (Signed)
ANTICOAGULATION CONSULT NOTE - Follow Up Consult  Pharmacy Consult for warfarin Indication: DVT  No Known Allergies  Patient Measurements: Height: 6\' 3"  (190.5 cm) Weight: 274 lb 7.6 oz (124.5 kg) IBW/kg (Calculated) : 84.5   Vital Signs: Temp: 97.7 F (36.5 C) (05/16 0456) Temp Source: Oral (05/16 0456) BP: 120/74 (05/16 0456) Pulse Rate: 69 (05/16 0456)  Labs: Recent Labs    05/02/18 0442 05/03/18 0454 05/04/18 0500  HGB  --  9.7*  --   HCT  --  33.4*  --   PLT  --  591*  --   LABPROT 22.5* 25.5* 25.5*  INR 1.99 2.34 2.35    Estimated Creatinine Clearance: 117.5 mL/min (by C-G formula based on SCr of 0.82 mg/dL).   Assessment: 71 YOM transferred to CIR on 4/11 with MSSA bacteremia currently being treated with cefazolin and rifampin through 05/22/18 (per ID recs). DVT noted on doppler 4/12, decision to anticoagulate is complicated by rifampin drug interaction with all oral anticoagulants. Full anticoagulation with warfarin and lovenox bridge began 5/9. High doses of warfarin were used initially to overcome interaction with rifampin - INR was therapeutic after only 2 doses. Patient and family were educated on warfarin monitoring and advised to follow up within 1-2 weeks of completing rifampin, as warfarin will need to be adjusted.  INR is therapeutic today at 2.35. Plan for discharge at 5/18   Goal of Therapy:  INR 2-3 Monitor platelets by anticoagulation protocol: Yes   Plan:  Warfarin 7.5 mg PO daily  If INR drops tomorrow, will consider 7.5 mg 5 days/week, and 10mg  2 days/week at discharge  Monitor daily INR, CBC, clinical course, s/sx of bleed, PO intake, DDI Discharge planning: follow up closely with anticoag clinic, especially after rifampin ends 6/3  Thank you for allowing [place] to participate in this patients care.  Maryanna Shape, PharmD, BCPS  Clinical Pharmacist  Pager: [phone]   05/04/2018 12:32 PM

## 2018-05-04 NOTE — Progress Notes (Signed)
Occupational Therapy Session Note  Patient Details  Name: Cody Velez MRN: [id number] Date of Birth: October 01, 1946  Today's Date: 05/04/2018 OT Individual Time: 3893-7342 OT Individual Time Calculation (min): 45 min    Short Term Goals: Week 5:  OT Short Term Goal 1 (Week 5): STG=LTG secondary to ELOS  Skilled Therapeutic Interventions/Progress Updates:    Pt received on toilet with A from NT.  Pt reported he was able to transfer to toilet with a squat pivot with S.  Pt worked on sit to stand from toilet to [employer].  Pt was able to release one hand but was unable to reach. He did not want to try his toilet aid so NT assisted with cleansing.  Pt sat back to toilet and then transferred back to w/c with S.  In room pt worked on Arts administrator with AE.  Worked on releasing one hand at a time to pull up clothing, but pt unable to safely balance without R knee flexing with one hand at a time so pt held balance with 2 hands with therapist pulling pants over hips.   With pt's girlfriend present,  Discussed strategies for easy use of BSC and toilet aid.  Pt stated they are ordering a tub bench. Pt's w/c does not fit into the bathroom but he plans to widen the door of the BR soon.  He stated he has not practiced tub bench transfers. Time in this session did not allow for the practice, but will request PT to practice later today with pt.    For a HEP for UE, pt worked with Level 3 theraband for B scapular adduction, Unilateral scap. Add (with bow and arrow), and tricep extension.  Recommended pt complete 15 reps at least 2x a day.   Pt preparing to go on outing today. Pt in room with girlfriend.   Therapy Documentation Precautions:  Precautions Precautions: Fall, Back Precaution Booklet Issued: No Precaution Comments: Pt needed min cues to recall 3/3 back precautions Required Braces or Orthoses: Spinal Brace Spinal Brace: Thoracolumbosacral orthotic, Applied in sitting position Restrictions Weight  Bearing Restrictions: No    Pain: Pain Assessment Pain Scale: 0-10 Pain Score: 6  Pain Type: Acute pain Pain Location: Back Pain Orientation: Mid Pain Descriptors / Indicators: Aching Pain Frequency: Constant Pain Onset: On-going Pain Intervention(s): Medication (See eMAR) ADL: ADL ADL Comments: Please see functional navigator  See Function Navigator for Current Functional Status.   Therapy/Group: Individual Therapy  [place] 05/04/2018, 12:10 PM

## 2018-05-04 NOTE — Progress Notes (Signed)
Physical Therapy Session Note  Patient Details  Name: Cody Velez MRN: [id number] Date of Birth: Jul 31, 1946  Today's Date: 05/04/2018 PT Individual Time: 6226-3335 PT Individual Time Calculation (min): 30 min   Short Term Goals: Week 5:  PT Short Term Goal 1 (Week 5): =LTGs due to ELOS  Skilled Therapeutic Interventions/Progress Updates:    no c/o pain.  Session focus on creation of HEP for pt to complete at home until HHPT starts.  PT and pt picked out exercises in supine and sitting to target all major LE muscle groups and reviewed each verbally.  Pt returned to bed at end of session, positioned with call bell in reach and needs met.   Therapy Documentation Precautions:  Precautions Precautions: Fall, Back Precaution Booklet Issued: No Precaution Comments: Pt needed min cues to recall 3/3 back precautions Required Braces or Orthoses: Spinal Brace Spinal Brace: Thoracolumbosacral orthotic, Applied in sitting position Restrictions Weight Bearing Restrictions: No   See Function Navigator for Current Functional Status.   Therapy/Group: Individual Therapy  Michel Santee 05/04/2018, 5:13 PM

## 2018-05-04 NOTE — Progress Notes (Signed)
Subjective/Complaints: No new issues this morning. At EOB donning his TLSO. Pain controlled.  ROS: Patient denies fever, rash, sore throat, blurred vision, nausea, vomiting, diarrhea, cough, shortness of breath or chest pain, joint or back pain, headache, or mood change.   Objective: Vital Signs: Blood pressure 120/74, pulse 69, temperature 97.7 F (36.5 C), temperature source Oral, resp. rate 17, height 6\' 3"  (1.905 m), weight 124.5 kg (274 lb 7.6 oz), SpO2 98 %. No results found. Results for orders placed or performed during the hospital encounter of 03/30/18 (from the past 72 hour(s))  Protime-INR     Status: Abnormal   Collection Time: 05/02/18  4:42 AM  Result Value Ref Range   Prothrombin Time 22.5 (H) 11.4 - 15.2 seconds   INR 1.99     Comment: Performed at [facility] [address] [place], [place] [address]  Protime-INR     Status: Abnormal   Collection Time: 05/03/18  4:54 AM  Result Value Ref Range   Prothrombin Time 25.5 (H) 11.4 - 15.2 seconds   INR 2.34     Comment: Performed at [facility] [address], [place], [place] [address]  CBC     Status: Abnormal   Collection Time: 05/03/18  4:54 AM  Result Value Ref Range   WBC 6.9 4.0 - 10.5 K/uL   RBC 3.96 (L) 4.22 - 5.81 MIL/uL   Hemoglobin 9.7 (L) 13.0 - 17.0 g/dL   HCT 33.4 (L) 39.0 - 52.0 %   MCV 84.3 78.0 - 100.0 fL   MCH 24.5 (L) 26.0 - 34.0 pg   MCHC 29.0 (L) 30.0 - 36.0 g/dL   RDW 16.6 (H) 11.5 - 15.5 %   Platelets 591 (H) 150 - 400 K/uL    Comment: Performed at [facility], [place] [address] [place], [place] [address]  Protime-INR     Status: Abnormal   Collection Time: 05/04/18  5:00 AM  Result Value Ref Range   Prothrombin Time 25.5 (H) 11.4 - 15.2 seconds   INR 2.35     Comment: Performed at [facility], [place] [address], [place], [place] [address]     Constitutional: No distress . Vital signs reviewed. HEENT: EOMI, oral membranes moist Neck: supple Cardiovascular:  RRR without murmur. No JVD    Respiratory: CTA Bilaterally without wheezes or rales. Normal effort    GI: BS +, non-tender, non-distended  Musc: left shoulder mildly tender w IR/ER Neuro: Alert and Oriented.  Motor: 5/5 in BUE proximal to distal RLE: hip flexion 3+/5, knee extension 3+/5, ankle dorsiflexion/plantarflexion 4/5   LLE: Hip flexion   3-/5, KE 3/5 ankle dorsiflexion/PF 3+/5 (exam stable) Skin: incision clean. PICC in place     Assessment/Plan: 1. Functional deficits secondary to paraplegia secondary to epidural abscess which require 3+ hours per day of interdisciplinary therapy in a comprehensive inpatient rehab setting. Physiatrist is providing close team supervision and 24 hour management of active medical problems listed below. Physiatrist and rehab team continue to assess barriers to discharge/monitor patient progress toward functional and medical goals. FIM: Function - Bathing Bathing activity did not occur: Refused Position: Shower Body parts bathed by patient: Right arm, Left arm, Chest, Abdomen, Front perineal area, Buttocks, Right upper leg, Left upper leg, Right lower leg, Left lower leg, Back Body parts bathed by helper: Right lower leg, Left lower leg, Back Bathing not applicable: Buttocks, Right upper leg, Left upper leg, Right lower leg, Left lower leg,  Back Assist Level: Assistive device, Touching or steadying assistance(Pt > 75%) Assistive Device Comment: LH sponge Set up : To obtain items  Function- Upper Body Dressing/Undressing Upper body dressing/undressing activity did not occur: Refused What is the patient wearing?: Orthosis Pull over shirt/dress - Perfomed by patient: Put head through opening, Thread/unthread left sleeve, Thread/unthread right sleeve, Pull shirt over trunk Pull over shirt/dress - Perfomed by helper: Pull shirt over trunk Orthosis activity level: Performed by helper Assist Level: Supervision or verbal cues Function - Lower Body  Dressing/Undressing What is the patient wearing?: Shoes Position: Sitting EOB Underwear - Performed by patient: Thread/unthread right underwear leg, Thread/unthread left underwear leg Underwear - Performed by helper: Pull underwear up/down Pants- Performed by patient: Thread/unthread right pants leg, Thread/unthread left pants leg Pants- Performed by helper: Pull pants up/down Non-skid slipper socks- Performed by patient: Don/doff right sock, Don/doff left sock Non-skid slipper socks- Performed by helper: Don/doff right sock, Don/doff left sock Socks - Performed by patient: Don/doff right sock, Don/doff left sock(with sock aide) Socks - Performed by helper: Don/doff right sock, Don/doff left sock Shoes - Performed by patient: Fasten right, Fasten left, Don/doff right shoe, Don/doff left shoe Shoes - Performed by helper: Don/doff right shoe, Don/doff left shoe, Fasten right, Fasten left Assist for footwear: Partial/moderate assist Assist for lower body dressing: Assistive device Assistive Device Comment: Shoe horn + reacher  Function - Toileting Toileting activity did not occur: No continent bowel/bladder event Toileting steps completed by patient: Adjust clothing after toileting Toileting steps completed by helper: Adjust clothing prior to toileting, Performs perineal hygiene, Adjust clothing after toileting Toileting Assistive Devices: Grab bar or rail Assist level: Set up/obtain supplies, More than reasonable time, Touching or steadying assistance (Pt.75%)  Function - Air cabin crew transfer activity did not occur: N/A Toilet transfer assistive device: Drop arm commode Mechanical lift: Stedy Assist level to toilet: 2 helpers Assist level from toilet: 2 helpers  Function - Chair/bed transfer Chair/bed transfer activity did not occur: N/A Chair/bed transfer method: Lateral scoot Chair/bed transfer assist level: Supervision or verbal cues Chair/bed transfer assistive  device: Armrests Mechanical lift: Stedy Chair/bed transfer details: Verbal cues for technique  Function - Locomotion: Wheelchair Will patient use wheelchair at discharge?: Yes Type: Manual Wheelchair activity did not occur: N/A Max wheelchair distance: 200' Assist Level: Supervision or verbal cues Wheel 50 feet with 2 turns activity did not occur: N/A Assist Level: Supervision or verbal cues Wheel 150 feet activity did not occur: N/A Assist Level: Supervision or verbal cues Turns around,maneuvers to table,bed, and toilet,negotiates 3% grade,maneuvers on rugs and over doorsills: No Function - Locomotion: Ambulation Ambulation activity did not occur: N/A Assistive device: Parallel bars, Maxi sky Max distance: 10' Assist level: Maximal assist (Pt 25 - 49%) Walk 10 feet activity did not occur: N/A Assist level: Maximal assist (Pt 25 - 49%)(with support of maxi sky) Walk 50 feet with 2 turns activity did not occur: N/A Walk 150 feet activity did not occur: N/A Walk 10 feet on uneven surfaces activity did not occur: N/A  Function - Comprehension Comprehension: Auditory Comprehension assist level: Follows complex conversation/direction with no assist  Function - Expression Expression: Verbal Expression assistive device: Communication board Expression assist level: Expresses complex ideas: With no assist  Function - Social Interaction Social Interaction assist level: Interacts appropriately with others with medication or extra time (anti-anxiety, antidepressant).  Function - Problem Solving Problem solving assist level: Solves complex problems: Recognizes & self-corrects  Function - Memory Memory assist  level: More than reasonable amount of time Patient normally able to recall (first 3 days only): Current season, Staff names and faces, That he or she is in a hospital, Location of own room  Medical Problem List and Plan: 1.Decreased functional mobilitysecondary to T10-11  osteomyelitis/epidural abscess with subsequent myelopathy/paraplegia, status post decompression and fusion T8-L3. Back brace when out of bed. Patient with history of multiple lumbar surgeries as well as spinal cord stimulator January 2019   Continue CIR  -working toward 5/18 dc 2. DVT left soleal, right peroneal DVT's:.          Repeat dopplers with ongoing DVT's in bilateral peroneal   and left soleal   -coumadin for rx---INR   therapeutic      -no cross covg necessary     -recheck dopplers at outpt 3. Pain Management:Neurontin 600 mg 3 times daily, Robaxin   as needed   Lidoderm patch     -scheduled tylenol  -oxycodone prn.--discussed decreasing use of oxy when possible  -left shoulder ROM exercises, heat/sports cream. ?inject 4. Mood:Aricept 10 mg nightly, Namenda 10 mg twice daily, Effexor 150 mg twice daily, trazodone 100 mg nightly  -STM deficits are notable  -providing regular positive reinforcement re: prognosis  5. Neuropsych: This patientisfully capable of making decisions on hisown behalf. 6. Skin/Wound Care:staples out per NS 7. Fluids/Electrolytes/Nutrition:Routine I/O's  -supplementing potassium KCl 20 mEq/day.    Potassium level 4.1 on 5/1   8.ID/MSSA bacteremia.Intravenous Ancef 2 g every 8 hours through 05/22/2018 and stop as well as rifampin 300 mg twice daily.    -outpt Follow-up per infectious disease  -HHRN has spoke to patient re: abx admin at home 9.Acute on chronic anemia.     Hemoglobin  9.0 on 4/30, repeat 9.7 5/15  -hemorrhoid cream prn  10.Hypertension. Norvasc 10 mg daily   Vitals:   05/03/18 2241 05/04/18 0456  BP: 109/68 120/74  Pulse: 82 69  Resp:  17  Temp:  97.7 F (36.5 C)  SpO2: 97% 98%    Borderline DBP's.  Introduced prinivil 5mg  daily with some improvement.  Continue current dosing  (on losartan PTA) 11.Hyperlipidemia. Pravachol 12.History of prostate cancer. Proscar 5 mg daily 13.Constipation.     -increased  linzess to 290mg   -resumed senna-s  -sorbitol with results  -fleet enema prn  -had bm 5/15  -have spoken to the patient about the fact that he will need to be proactive with bowels 14. Urine retention, occasional incontinence seems to be improved  ---Ua negative, ucx negative 15. Morbid obesity   Body mass index is 34.31 kg/m.  Diet and exercise education  Encourage weight loss to increase endurance and promote overall health 16. Sleep disturbance  Melatonin added to trazodone on 5/5 with good results.      [name], MD, [place] [facility] [place] 05/04/2018, 8:19 AM

## 2018-05-05 ENCOUNTER — Inpatient Hospital Stay (HOSPITAL_COMMUNITY): Payer: Medicare Other | Admitting: Physical Therapy

## 2018-05-05 ENCOUNTER — Inpatient Hospital Stay (HOSPITAL_COMMUNITY): Payer: Medicare Other

## 2018-05-05 LAB — PROTIME-INR
INR: 2.37
Prothrombin Time: 25.7 seconds — ABNORMAL HIGH (ref 11.4–15.2)

## 2018-05-05 MED ORDER — POTASSIUM CHLORIDE CRYS ER 20 MEQ PO TBCR: 20.0000 meq | EXTENDED_RELEASE_TABLET | Freq: Every day | ORAL | 0 refills | Status: DC

## 2018-05-05 MED ORDER — PRAVASTATIN SODIUM 10 MG PO TABS: 10.0000 mg | ORAL_TABLET | Freq: Every evening | ORAL | 0 refills | Status: DC

## 2018-05-05 MED ORDER — GABAPENTIN 300 MG PO CAPS: 600.0000 mg | ORAL_CAPSULE | Freq: Three times a day (TID) | ORAL | 0 refills | Status: DC

## 2018-05-05 MED ORDER — OXYCODONE HCL 10 MG PO TABS: 10.0000 mg | ORAL_TABLET | ORAL | 0 refills | Status: DC | PRN

## 2018-05-05 MED ORDER — CEFAZOLIN IV (FOR PTA / DISCHARGE USE ONLY): 2.0000 g | Freq: Three times a day (TID) | INTRAVENOUS | 0 refills | Status: DC

## 2018-05-05 MED ORDER — LISINOPRIL 5 MG PO TABS: 5.0000 mg | ORAL_TABLET | Freq: Every day | ORAL | 0 refills | Status: DC

## 2018-05-05 MED ORDER — MELATONIN 3 MG PO TABS: 1.5000 mg | ORAL_TABLET | Freq: Every day | ORAL | 0 refills | Status: DC

## 2018-05-05 MED ORDER — WARFARIN SODIUM 5 MG PO TABS: ORAL_TABLET | ORAL | 1 refills | Status: DC

## 2018-05-05 MED ORDER — VITAMIN B-12 1000 MCG PO TABS: 1000.0000 ug | ORAL_TABLET | Freq: Every day | ORAL | 0 refills | Status: DC

## 2018-05-05 MED ORDER — OMEPRAZOLE 20 MG PO CPDR: 40.0000 mg | DELAYED_RELEASE_CAPSULE | Freq: Every day | ORAL | 0 refills | Status: DC

## 2018-05-05 MED ORDER — LINACLOTIDE 290 MCG PO CAPS: 290.0000 ug | ORAL_CAPSULE | Freq: Every day | ORAL | 0 refills | Status: DC

## 2018-05-05 MED ORDER — DONEPEZIL HCL 10 MG PO TABS: 10.0000 mg | ORAL_TABLET | Freq: Every day | ORAL | 3 refills | Status: DC

## 2018-05-05 MED ORDER — MEMANTINE HCL 10 MG PO TABS: 10.0000 mg | ORAL_TABLET | Freq: Two times a day (BID) | ORAL | 3 refills | Status: DC

## 2018-05-05 MED ORDER — AMLODIPINE BESYLATE 10 MG PO TABS: 10.0000 mg | ORAL_TABLET | Freq: Every day | ORAL | 0 refills | Status: DC

## 2018-05-05 MED ORDER — RIFAMPIN 300 MG PO CAPS: ORAL_CAPSULE | ORAL | 0 refills | Status: DC

## 2018-05-05 MED ORDER — VENLAFAXINE HCL ER 150 MG PO CP24: 150.0000 mg | ORAL_CAPSULE | Freq: Two times a day (BID) | ORAL | 0 refills | Status: DC

## 2018-05-05 MED ORDER — TRAZODONE HCL 100 MG PO TABS: 100.0000 mg | ORAL_TABLET | Freq: Every day | ORAL | 0 refills | Status: DC

## 2018-05-05 MED ORDER — LIDOCAINE 5 % EX PTCH: 1.0000 | MEDICATED_PATCH | CUTANEOUS | 0 refills | Status: DC

## 2018-05-05 MED ORDER — FINASTERIDE 5 MG PO TABS: 5.0000 mg | ORAL_TABLET | Freq: Every day | ORAL | 0 refills | Status: DC

## 2018-05-05 MED ORDER — WARFARIN SODIUM 5 MG PO TABS
10.0000 mg | ORAL_TABLET | Freq: Once | ORAL | Status: AC
  Administered 2018-05-05: 10 mg via ORAL
  Filled 2018-05-05: qty 2

## 2018-05-05 MED ORDER — BACLOFEN 5 MG PO TABS: 5.0000 mg | ORAL_TABLET | Freq: Three times a day (TID) | ORAL | 0 refills | Status: DC | PRN

## 2018-05-05 MED ORDER — METHOCARBAMOL 500 MG PO TABS: 500.0000 mg | ORAL_TABLET | Freq: Four times a day (QID) | ORAL | 0 refills | Status: DC

## 2018-05-05 NOTE — Progress Notes (Signed)
ANTICOAGULATION CONSULT NOTE - Follow Up Consult  Pharmacy Consult for warfarin Indication: DVT  No Known Allergies  Patient Measurements: Height: 6\' 3"  (190.5 cm) Weight: 269 lb 8 oz (122.2 kg) IBW/kg (Calculated) : 84.5   Vital Signs: Temp: 98 F (36.7 C) (05/17 0640) Temp Source: Oral (05/17 0640) BP: 142/86 (05/17 0640) Pulse Rate: 74 (05/17 0640)  Labs: Recent Labs    05/03/18 0454 05/04/18 0500 05/05/18 0355  HGB 9.7*  --   --   HCT 33.4*  --   --   PLT 591*  --   --   LABPROT 25.5* 25.5* 25.7*  INR 2.34 2.35 2.37    Estimated Creatinine Clearance: 116.4 mL/min (by C-G formula based on SCr of 0.82 mg/dL).   Assessment: 71 YOM transferred to CIR on 4/11 with MSSA bacteremia currently being treated with cefazolin and rifampin through 05/22/18 (per ID recs). DVT noted on doppler 4/12, decision to anticoagulate is complicated by rifampin drug interaction with all oral anticoagulants. Full anticoagulation with warfarin and lovenox bridge began 5/9. High doses of warfarin were used initially to overcome interaction with rifampin - INR was therapeutic after only 2 doses. Patient and family were educated on warfarin monitoring and advised to follow up within 1-2 weeks of completing rifampin, as warfarin will need to be adjusted. PO meal intake good.   04/24/18: Repeat dopplers with ongoing DVT's in bilateral peroneal  and left soleal  INR is therapeutic today at 2.37. Plan for discharge at 5/18  Based on warfarin doses last 7 days, INR 2.34-2.35-2.37 last 3 days and probable  drug interaction with rifampin, the patient may need higher dose of warfarin today to keep INR 2-3.   Goal of Therapy:  INR 2-3 Monitor platelets by anticoagulation protocol: Yes   Plan:  Warfarin 10 mg x 1 today PO daily  Depending on INR results tomorrow,will l consider 7.5 mg 5 days/week, and 10mg  2 days/week versus 7.5mg  daily at discharge  Inpatient: Monitor daily INR, CBC, clinical course,  s/sx of bleed, PO intake, DDI Discharge planning:    [place], [place] discharge summary note indicates that Dr. Asencion Noble will f/u for medical management and [facility] nurse to check 1st INR on 05/09/18 , INR results to Dr. Willey Blade phone # [phone], fax # [phone].  Recommend to check INR at least once weekly until INR therapeutic on stable dose regimen for DVT treatment. And closer INR f/u after Rifampin ends 05/22/18 as noted above. Rifampin increases the metabolism of warfarin thus decreases warfarin effect> requiring higher dose of warfarin while taking rifampin.  When rifampins stops 05/22/18, will likely need a lower warfarin dose to keep INR 2-3.  I notified Carolynn Sayers with [facility] of the above. I will call Dr. Ria Comment office to give above recommendation and info of patient's warfarin/rifampin  DDI.  Thank you for allowing [place] to participate in this patients care. Nicole Cella, [place] Clinical Pharmacist Pager: [phone] 05/05/2018 9:36 AM

## 2018-05-05 NOTE — Discharge Summary (Signed)
Discharge summary job [id number]

## 2018-05-05 NOTE — Progress Notes (Signed)
Physical Therapy Session Note  Patient Details  Name: Cody Velez MRN: [id number] Date of Birth: 04-Aug-1946  Today's Date: 05/05/2018 PT Individual Time: 1300-1410 PT Individual Time Calculation (min): 70 min   Short Term Goals: Week 5:  PT Short Term Goal 1 (Week 5): =LTGs due to ELOS  Skilled Therapeutic Interventions/Progress Updates:    Pt supine in bed, agreeable to PT. Pt reports 8/10 back pain and requests pain medication from RN at end of therapy session. Supine to sit with SBA with v/c for log-rolling technique. Pt's care assist's pt with donning TLSO while seated EOB. Lateral scoot transfer bed to w/c with Supervision from care partner. Manual w/c propulsion 2 x 200 ft Mod I. Sit to stand with min A to RW. Ambulation x 12 ft with assist x 2 for balance, w/c, close w/c follow for safety due to BLE weakness and buckling. Pt exhibits scissoring gait pattern and decreased coordination of BLE with gait. Stand pivot transfer w/c to elevated mat height (31") to simulate pt's bed at home with RW and min A for balance. Pt is able to sit safely on EOB, however he is only able to perform a stand pivot transfer with RW and is unable to perform lateral scoot transfer due to significant height difference between w/c and bed. Stand pivot transfer bed to w/c with RW and mod A, pt's L knee buckles during transfer and he requires mod A to sit safely in w/c. Educated pt and his care partner than it would be safer to remove box springs on bed at home to lower height so that he can perform safer lateral scoot transfers to the bed instead of stand pivot with RW. Pt and his care partner state understanding. Reviewed HEP with patient and care partner: supine BLE therex x 10 reps. Pt left supine in bed with needs in reach, care partner at bedside.  Therapy Documentation Precautions:  Precautions Precautions: Fall, Back Precaution Booklet Issued: No Precaution Comments: Pt able to recall 3/3 back precautions  with min visual cues Required Braces or Orthoses: Spinal Brace Knee Immobilizer - Right: On when out of bed or walking Spinal Brace: Thoracolumbosacral orthotic, Applied in sitting position Restrictions Weight Bearing Restrictions: No Other Position/Activity Restrictions: sit upright in chair for spinal protection  See Function Navigator for Current Functional Status.   Therapy/Group: Individual Therapy  Excell Seltzer, PT, DPT  05/05/2018, 4:18 PM

## 2018-05-05 NOTE — Progress Notes (Signed)
Occupational Therapy Discharge Summary  Patient Details  Name: Cody Velez MRN: [id number] Date of Birth: 1946-04-05   Session 1: 30 min skilled tx 1135-1205 1:1. Pt and girlfriend present during session. Pt initially discussing toileting options and toilet aide use with DAC while RN hooks up IV. Pt propels w/c with MOD I to ADL apartment. Pt completes transfer into tub with supervision and VC for w/c parts management and sequencing of BLE into/out of tub. Pt requires A to bring LLE out if tub upon exiting. OT recommend pt complete first shower at home with HHOT if bathroom doorway is adapted while recieving HHOT. PT and girlfriend verbalize understanding. Exited session with pt seated in bed, call light in reach and all needs met.  Session 2: 60 min 1505-1605 1:1. Rn already administered pain medicine for back. Pt reporting back pain and declines showering this session however willing to bathe at sink. Pt bathes sit to stand level with A to wash back and buttocks. Pt uses LHSS to wash B feet. Pt dons shirt and brace with supervision. Pt dons pants with reacher and A to advance pants past hips with min A for stnading balance/transitional movement. Pt uses sock aide, reacher and shoe funnel to don socks and shoes with min VC for technique. Pt able to assume seated figure 4 to fasten shoes with shoe buttons. Pt propels w/c to outside courtyard for improved endurance in community mobility distances with VC to not take rest break and back into elevators. Exited session with pt seated in bed, call light in reach and all needs met.   Patient has met 9 of 9 long term goals due to improved activity tolerance, improved balance, postural control, ability to compensate for deficits, functional use of  RIGHT lower and LEFT lower extremity and improved coordination.  Patient to discharge at [facility] Assist level.  Patient's care partner is independent to provide the necessary physical assistance at  discharge.    Reasons goals not met: n/a  Recommendation:  Patient will benefit from ongoing skilled OT services in home health setting to continue to advance functional skills in the area of BADL and iADL.  Equipment: DAC, TTB  Reasons for discharge: treatment goals met and discharge from hospital  Patient/family agrees with progress made and goals achieved: Yes  OT Discharge Precautions/Restrictions  Precautions Precautions: Fall;Back Precaution Booklet Issued: No Precaution Comments: Pt able to recall 3/3 back precautions with min visual cues Required Braces or Orthoses: Spinal Brace Knee Immobilizer - Right: On when out of bed or walking Spinal Brace: Thoracolumbosacral orthotic;Applied in sitting position Restrictions Weight Bearing Restrictions: No Other Position/Activity Restrictions: sit upright in chair for spinal protection General   Vital Signs  Pain Pain Assessment Pain Scale: 0-10 Pain Score: 0-No pain Faces Pain Scale: No hurt ADL ADL ADL Comments: Please see functional navigator Vision Baseline Vision/History: Wears glasses Wears Glasses: Reading only Patient Visual Report: Peripheral vision impairment Vision Assessment?: No apparent visual deficits Perception  Perception: Within Functional Limits Praxis Praxis: Intact Cognition Overall Cognitive Status: Within Functional Limits for tasks assessed Arousal/Alertness: Awake/alert Orientation Level: Oriented X4 Memory: Appears intact Awareness: Appears intact Problem Solving: Impaired Safety/Judgment: Impaired Sensation Sensation Light Touch: Appears Intact Light Touch Impaired Details: Impaired RLE;Impaired LLE Proprioception: Impaired Detail Proprioception Impaired Details: Impaired LLE;Impaired RLE Additional Comments: unable to determine position of toes or ankles bilaterally  Coordination Gross Motor Movements are Fluid and Coordinated: No Fine Motor Movements are Fluid and Coordinated:  Yes Coordination and  Movement Description: LEs with ataxia and decreased motor control Finger Nose Finger Test: R=L normal Heel Shin Test: R=L, significant impairment Motor  Motor Motor: Paraplegia;Ataxia Motor - Skilled Clinical Observations: L>R Motor - Discharge Observations: L>R but improving since eval in coordination and strength Mobility  Transfers Transfers: Sit to Stand;Stand to Sit Sit to Stand: 4: Min assist;3: Mod assist Stand to Sit: 4: Min assist;3: Mod assist  Trunk/Postural Assessment  Cervical Assessment Cervical Assessment: Within Functional Limits Thoracic Assessment Thoracic Assessment: (TLSO) Lumbar Assessment Lumbar Assessment: Exceptions to WFL(TLSO) Lumbar Strength Overall Lumbar Strength: Deficits Postural Control Postural Control: Within Functional Limits Trunk Control: needs min assist for sitting balance  Balance Balance Balance Assessed: Yes Static Sitting Balance Static Sitting - Balance Support: Feet supported Static Sitting - Level of Assistance: 6: Modified independent (Device/Increase time) Dynamic Sitting Balance Dynamic Sitting - Balance Support: Feet supported;Right upper extremity supported Dynamic Sitting - Level of Assistance: 5: Stand by assistance Dynamic Sitting - Balance Activities: Reaching for objects Sitting balance - Comments: requires min-mod assist and bil UE supports in sitting, anterior lean Extremity/Trunk Assessment RUE Assessment RUE Assessment: Within Functional Limits LUE Assessment LUE Assessment: Within Functional Limits   See Function Navigator for Current Functional Status.  Cody Velez 05/05/2018, 12:13 PM

## 2018-05-05 NOTE — Progress Notes (Signed)
PHARMACY CONSULT NOTE FOR:  OUTPATIENT  PARENTERAL ANTIBIOTIC THERAPY (OPAT)  Indication:  MSSA bacteremia and extensive epidural abscess, discitis of thoracic spine Regimen: Cefazolin 2 g IVPB q8 hours End date: 05/22/2018  IV antibiotic discharge orders are pended. To discharging provider:  please sign these orders via discharge navigator,  Select New Orders & click on the button choice - Manage This Unsigned Work.     Thank you for allowing pharmacy to be a part of this patient's care.  Nicole Cella, [place] Clinical Pharmacist Pager: [phone] 05/05/2018, 9:28 AM

## 2018-05-05 NOTE — Progress Notes (Signed)
Physical Therapy Discharge Summary  Patient Details  Name: Cody Velez MRN: [id number] Date of Birth: 11/24/1946  Today's Date: 05/05/2018 PT Individual Time: 1000-1100 PT Individual Time Calculation (min): 60 min    Patient has met 6 of 6 long term goals due to improved activity tolerance, improved balance, improved postural control, increased strength, ability to compensate for deficits, functional use of  right lower extremity and left lower extremity, improved attention, improved awareness and improved coordination.  Patient to discharge at a wheelchair level Min Assist.   Patient's care partner is independent to provide the necessary physical assistance at discharge.  Have had several discussions with pt and caregiver, Toni, regarding pt to remain w/c level at d/c even though have progressed to working on stand/pivot transfers here.  Have educated both that skilled decision making still very much needed during stand/pivot transfers and they are not safe to begin at home just yet.  Have encouraged them to continue with lateral scoot and squat/pivot transfers, slide board for car, and to progress with HHPT as indicated.  Both verbalized understanding.    Recommendation:  Patient will benefit from ongoing skilled PT services in home health setting to continue to advance safe functional mobility, address ongoing impairments in coordination, motor control, balance, strength, proprioception, and safety awareness, and minimize fall risk.  Equipment: loaner w/c (Josh from [facility] to provide with permanent lightweight w/c when ready), slide board  Reasons for discharge: treatment goals met  Patient/family agrees with progress made and goals achieved: Yes   Skilled PT Intervention: No c/o pain.  Session focus on d/c assessment and pt/caregiver education on d/c plan, DME, and f/u therapy.    Pt currently performing mobility with assist as described below.  Reviewed lateral scoot transfers with  Toni, and educated both Toni and pt that stand/pivot transfers should not be performed at d/c unless they are working on it with a therapist.  Discussed need for skilled decision making during stand/pivots and that they're not currently safe to be performing at home without therapy.  Both verbalized understanding.  Reviewed f/u with HHPT and DME (slide board and w/c), and demonstrated how to collapse/set up w/c and cushion to pt and Toni.  Pt returned to room at end of session, positioned in w/c with call bell in reach and needs met.   PT Discharge Precautions/Restrictions Precautions Precautions: Fall;Back Precaution Comments: Pt able to recall 3/3 back precautions with min visual cues Required Braces or Orthoses: Spinal Brace Knee Immobilizer - Right: On when out of bed or walking Spinal Brace: Thoracolumbosacral orthotic;Applied in sitting position Restrictions Weight Bearing Restrictions: No Pain Pain Assessment Pain Scale: 0-10 Pain Score: 0-No pain Vision/Perception  Perception Perception: Within Functional Limits Praxis Praxis: Intact  Cognition Overall Cognitive Status: Within Functional Limits for tasks assessed Arousal/Alertness: Awake/alert Orientation Level: Oriented X4 Memory: Appears intact Awareness: Appears intact Problem Solving: Impaired Safety/Judgment: Impaired Sensation Sensation Light Touch: Appears Intact(LEs, though reports decreased on RLE compared to LLE) Proprioception Impaired Details: Impaired LLE;Impaired RLE Additional Comments: unable to determine position of toes or ankles bilaterally  Coordination Gross Motor Movements are Fluid and Coordinated: No Fine Motor Movements are Fluid and Coordinated: Yes Coordination and Movement Description: LEs with ataxia and decreased motor control Finger Nose Finger Test: R=L normal Heel Shin Test: R=L, significant impairment Motor  Motor Motor: Paraplegia;Ataxia Motor - Discharge Observations: L>R but  improving since eval in coordination and strength  Mobility Transfers Transfers: Yes Squat Pivot Transfers: 5: Supervision Squat Pivot   Transfer Details: Verbal cues for technique;Verbal cues for sequencing   Performed 2 stand/pivot transfers with RW and mod assist with verbal cues for foot placement, and attention to foot placement.  Locomotion     Trunk/Postural Assessment  Cervical Assessment Cervical Assessment: Within Functional Limits Thoracic Assessment Thoracic Assessment: (TLSO) Postural Control Postural Control: Within Functional Limits(in sitting)  Balance Balance Balance Assessed: Yes Static Sitting Balance Static Sitting - Balance Support: Feet supported Static Sitting - Level of Assistance: 6: Modified independent (Device/Increase time) Dynamic Sitting Balance Dynamic Sitting - Balance Support: Feet supported;Right upper extremity supported Dynamic Sitting - Level of Assistance: 5: Stand by assistance Extremity Assessment      RLE Assessment RLE Assessment: Exceptions to WFL RLE PROM (degrees) Overall PROM Right Lower Extremity: Within functional limits for tasks assessed RLE Strength Right Hip Flexion: 3/5 Right Knee Flexion: 4/5 Right Knee Extension: 4/5 Right Ankle Dorsiflexion: 3+/5 Right Ankle Plantar Flexion: 3/5 LLE Assessment LLE Assessment: Exceptions to WFL LLE PROM (degrees) Overall PROM Left Lower Extremity: Within functional limits for tasks assessed LLE Strength Left Hip Flexion: 3/5 Left Knee Flexion: 3/5 Left Knee Extension: 3+/5 Left Ankle Dorsiflexion: 3+/5 Left Ankle Plantar Flexion: 3/5   See Function Navigator for Current Functional Status.   E  05/05/2018, 10:57 AM  

## 2018-05-05 NOTE — Progress Notes (Signed)
Physical Therapy Session Note  Patient Details  Name: Cody Velez MRN: [id number] Date of Birth: 01-Sep-1946  Today's Date: 05/04/2018 PT Individual Time:1330-1445   75 min   Short Term Goals: Week 5:  PT Short Term Goal 1 (Week 5): =LTGs due to ELOS  Skilled Therapeutic Interventions/Progress Updates:   Pt received supine in bed and agreeable to PT. Supine>sit transfer with supervision assist and min cues for LE management. PT assisted pt to don shoes, knee brace, and TLSO sitting EOB. Squat pivot transfer to [facility] with min assist from PT.   WC mobility instructed by PT throughout rehab unit with mod I-supervision assist x 245f, 2524f [id number] cues from PT for [facility] management and safety in simulated home environment. PT instructed pt in stand pivot transfer x 2 with min assist and moderate cues for technique, safety and improved attention to the RLE due to poor proprioception. squat pivot transfer also performed to tub bench to simulate shower access at home. Min assist for squat pivot on and off tub bench and mod assist for LE management into to due to strength deficits and body habitus.   PT isntructed pt in gait training x 3 ft in [place]. Pt unable to perform adequate step due to poor propriocetion and limited space anterior to BLE. Gait training also instructed by PT with RW. 6ft 33f ft with mod assist +2 for AD management, and WC following. Moderate cues for gait pattern to increased step width and decrease step length. Mild knee instability at end of each walk.   Patient returned to room and [facility] reach and all needs met.         Therapy Documentation Precautions:  Precautions Precautions: Fall, Back Precaution Booklet Issued: No Precaution Comments: Pt needed min cues to recall 3/3 back precautions Required Braces or Orthoses: Spinal Brace Spinal Brace: Thoracolumbosacral orthotic, Applied in sitting  position Restrictions Weight Bearing Restrictions: No    Vital Signs: Therapy Vitals Temp: 98 F (36.7 C) Temp Source: Oral Pulse Rate: 74 Resp: 12 BP: (!) 142/86 Patient Position (if appropriate): Lying Oxygen Therapy SpO2: 96 % O2 Device: Room Air Pain: Denies.   See Function Navigator for Current Functional Status.   Therapy/Group: Individual Therapy  AustiLorie Phenix/2019, 7:47 AM

## 2018-05-05 NOTE — Discharge Summary (Signed)
NAME: Cody Velez, Cody Velez. MEDICAL RECORD [id number] ACCOUNT [id number] DATE OF BIRTH:03/22/1946 FACILITY: [facility] LOCATION: [facility] PHYSICIAN:ZACHARY SWARTZ, MD  DISCHARGE SUMMARY  DATE OF DISCHARGE:  05/06/2018  DISCHARGE DIAGNOSES: 1.  T10-T11 osteomyelitis with epidural abscess with subsequent myelopathy, paraplegia, status post decompression and fusion. 2.  Left soleal right peroneal deep venous thrombosis. 3.  Pain management. 4.  Mood. 5.  Methicillin sensitive Staphylococcus aureus. 6.  Acute on chronic anemia. 7.  Hypertension. 8.  Hyperlipidemia. 9.  History of prostate cancer. 10.  Constipation. 11.  Urinary retention, resolved. 12.  Morbid obesity.  HISTORY OF PRESENT ILLNESS:  A 72 year old right-handed male with a history of hypertension, memory deficits maintained on Aricept as well as Namenda, prostate cancer, PTSD and chronic back pain as well as multiple back surgeries and recent spinal cord  stimulator placed in January of 2019.  HOSPITAL COURSE:  The patient lives with a girlfriend, independent with a cane prior to admission.  Presented 03/16/2018 with lethargy, fall as well as hypoxemia and paraparesis.  CT angiogram of the chest negative for pulmonary emboli.  Cranial CT scan  negative.  Echocardiogram with ejection fraction of 16%, systolic function normal.  EEG negative.  X-rays and imaging revealed osteomyelitis T10-T11 with epidural abscess.  Infectious disease consulted for bacteremia due to MSSA and maintained on broad  spectrum antibiotics on cefazolin x8 weeks' duration through 05/22/2018 as well as rifampin.  The patient underwent laminectomy T10-T11 decompression epidural abscess debridement with fixation T8-L3 on 03/28/2018 per Dr. Ellene Route.  Back brace when out of  bed.  HOSPITAL COURSE:  Pain management.  The patient was admitted for comprehensive rehabilitation program.  PAST MEDICAL HISTORY:  See discharge diagnoses.  SOCIAL HISTORY:  Lives with  girlfriend.  Uses a cane prior to admission.  FUNCTIONAL STATUS:  Upon admission to rehabilitation services was mod to max assist mobility, max total assist with activities of daily living.  PHYSICAL EXAMINATION: VITAL SIGNS:  Blood pressure 127/73, pulse 88, temperature 98, respirations 18. GENERAL:  Alert male, in no acute distress.  EOMs intact. NECK:  Supple, nontender.  No JVD. CARDIOVASCULAR:  Rate controlled.   ABDOMEN:  Soft, nontender, good bowel sounds. LUNGS:  Clear to auscultation without wheeze. EXTREMITIES:  Full passive range of motion of all 4 extremities:  Upper extremities 5/5 biceps, deltoids, triceps.  Grip 3-/5 on the right, 2- on left hip flexors, knee extensor, ankle dorsi and plantar flexion.  REHABILITATION HOSPITAL COURSE:  The patient was admitted to inpatient rehabilitation services.  Therapies initiated on a 3-hour daily basis consisting of physical therapy, occupational therapy and rehabilitation nursing.  The following issues were  addressed during patient's rehabilitation stay.  Pertaining to the patient's T10-T11 osteomyelitis, paraplegia myelopathy, he had undergone decompression fusion T8-L3.  Back brace when out of bed.  He would follow up with neurosurgery, Dr. Ellene Route.    Hospital course complicated by left soleal right peroneal DVT.  Followed routinely by repeat Dopplers.  Initially placed on Xarelto with contraindications against rifampin.  Thus, the patient transitioned to Coumadin therapy.  No bleeding episodes and  would be followed by Dr. Asencion Noble, who was contacted prior to patient's departure from the hospital and a home health nurse has been arranged.    Pain management.  Use of Neurontin, Lidoderm patch, oxycodone with good results.    Noted mood.  Maintained on Aricept, Namenda and Effexor as well as trazodone prior to admission for short term memory deficits.  The patient at baseline.  He would continue on intravenous Ancef as well as rifampin  through 05/22/2018 for MSSA bacteremia,  following up with infectious disease.  He remained afebrile.    Acute on chronic anemia, stable.  Blood pressure is controlled with Norvasc.    Bouts of constipation, resolved with laxative assistance.    Morbid obesity, BMI 34.31.  Dietary followup.    The patient received weekly collaborative interdisciplinary team conferences to discuss estimated length of stay, family teaching, any barriers to discharge.  The patient continued to progress nicely in all areas of mobility.  Needed some extra time to  complete tasks, monitoring of balance.  Able to negotiate community settings from his wheelchair with occasional supervision to negotiate curbs.  Supine to sit transfers with supervision assist.  Needing some assistance to put on his back brace.  The  patient discharged to home.  DISCHARGE MEDICATIONS:  Included Norvasc 10 mg p.o. daily, aspirin 81 mg p.o. daily, Ancef 2 grams every 8 hours through 05/22/2018 and stop.  Restasis one drop both eyes twice daily, Aricept 10 mg p.o. at bedtime, Proscar 5 mg p.o. daily, Neurontin 600  mg p.o. t.i.d.  Xalatan ophthalmic solution 0.005% one drop both eyes at bedtime, Lidoderm patch daily, Linzess 290 mcg p.o. daily, lisinopril 5 mg p.o. daily, melatonin 1.5 mg p.o. at bedtime, Namenda 10 mg p.o. b.i.d., Robaxin 500 mg p.o. q.i.d.,  Protonix 40 mg p.o. daily, potassium chloride 20 mEq p.o. daily, Pravachol 10 mg daily, rifampin 300 mg p.o. every 12 hours through 05/22/2018 and stop.  Senokot-S 2 tablets at bedtime, trazodone 100 mg p.o. at bedtime, Effexor 150 mg p.o. b.i.d.,  vitamin B12 1000 mcg p.o. daily, Coumadin latest dose 7.5 mg p.o. daily, baclofen 5 mg p.o. t.i.d. as needed, oxycodone 10 mg every 4 hours as needed for pain.    His diet was regular.    The patient would follow up with Dr. Asencion Noble, medical management as directed; Dr. Alger Simons at the outpatient rehab service office as directed.  Dr.  Kristeen Miss call for appointment; Dr. Michel Bickers infectious disease, call for appointment.    Continue intravenous Ancef as well as rifampin through 05/22/2018 and stop.  Home health nurse to check INR 05/09/2018 results to Dr. Willey Blade, phone number [phone], fax number [phone].  AN/NUANCE D:05/05/2018 T:05/05/2018 JOB:[id number]

## 2018-05-05 NOTE — Progress Notes (Signed)
Subjective/Complaints: Pt in bed. No new complaints. Pain improving. Ambulated with RW and very excited about that.   ROS: Patient denies fever, rash, sore throat, blurred vision, nausea, vomiting, diarrhea, cough, shortness of breath or chest pain, joint or back pain, headache, or mood change.   Objective: Vital Signs: Blood pressure (!) 142/86, pulse 74, temperature 98 F (36.7 C), temperature source Oral, resp. rate 12, height 6\' 3"  (1.905 m), weight 122.2 kg (269 lb 8 oz), SpO2 96 %. No results found. Results for orders placed or performed during the hospital encounter of 03/30/18 (from the past 72 hour(s))  Protime-INR     Status: Abnormal   Collection Time: 05/03/18  4:54 AM  Result Value Ref Range   Prothrombin Time 25.5 (H) 11.4 - 15.2 seconds   INR 2.34     Comment: Performed at [facility] [address], [place], [place] [address]  CBC     Status: Abnormal   Collection Time: 05/03/18  4:54 AM  Result Value Ref Range   WBC 6.9 4.0 - 10.5 K/uL   RBC 3.96 (L) 4.22 - 5.81 MIL/uL   Hemoglobin 9.7 (L) 13.0 - 17.0 g/dL   HCT 33.4 (L) 39.0 - 52.0 %   MCV 84.3 78.0 - 100.0 fL   MCH 24.5 (L) 26.0 - 34.0 pg   MCHC 29.0 (L) 30.0 - 36.0 g/dL   RDW 16.6 (H) 11.5 - 15.5 %   Platelets 591 (H) 150 - 400 K/uL    Comment: Performed at [facility], [place] [address] [place], [place] [address]  Protime-INR     Status: Abnormal   Collection Time: 05/04/18  5:00 AM  Result Value Ref Range   Prothrombin Time 25.5 (H) 11.4 - 15.2 seconds   INR 2.35     Comment: Performed at [facility], [place] [address] [place], [place] [address]  Protime-INR     Status: Abnormal   Collection Time: 05/05/18  3:55 AM  Result Value Ref Range   Prothrombin Time 25.7 (H) 11.4 - 15.2 seconds   INR 2.37     Comment: Performed at [facility] [address] [place], [place] [address]     Constitutional: No distress . Vital signs reviewed. HEENT: EOMI, oral membranes moist Neck:  supple Cardiovascular: RRR without murmur. No JVD    Respiratory: CTA Bilaterally without wheezes or rales. Normal effort    GI: BS +, non-tender, non-distended  Musc: left shoulder with mild RTC pain Neuro: Alert and Oriented.  Motor: 5/5 in BUE proximal to distal RLE: hip flexion 3+ to 4-/5, knee extension 3+ to4-/5, ankle dorsiflexion/plantarflexion 4/5   LLE: Hip flexion   3/5, KE 3+/5 ankle dorsiflexion/PF 3+ to 4/5  ) Skin: incision clean. PICC in place RUE     Assessment/Plan: 1. Functional deficits secondary to paraplegia secondary to epidural abscess which require 3+ hours per day of interdisciplinary therapy in a comprehensive inpatient rehab setting. Physiatrist is providing close team supervision and 24 hour management of active medical problems listed below. Physiatrist and rehab team continue to assess barriers to discharge/monitor patient progress toward functional and medical goals. FIM: Function - Bathing Bathing activity did not occur: Refused Position: Shower Body parts bathed by patient: Right arm, Left arm, Chest, Abdomen, Front perineal area, Buttocks, Right upper leg, Left upper leg, Right lower leg, Left lower leg, Back Body parts bathed by helper: Right lower leg, Left lower leg, Back Bathing not applicable: Buttocks,  Right upper leg, Left upper leg, Right lower leg, Left lower leg, Back Assist Level: Assistive device, Touching or steadying assistance(Pt > 75%) Assistive Device Comment: LH sponge Set up : To obtain items  Function- Upper Body Dressing/Undressing Upper body dressing/undressing activity did not occur: Refused What is the patient wearing?: Orthosis Pull over shirt/dress - Perfomed by patient: Put head through opening, Thread/unthread left sleeve, Thread/unthread right sleeve, Pull shirt over trunk Pull over shirt/dress - Perfomed by helper: Pull shirt over trunk Orthosis activity level: Performed by helper Assist Level: Supervision or verbal  cues Function - Lower Body Dressing/Undressing What is the patient wearing?: Shoes Position: Sitting EOB Underwear - Performed by patient: Thread/unthread right underwear leg, Thread/unthread left underwear leg Underwear - Performed by helper: Pull underwear up/down Pants- Performed by patient: Thread/unthread right pants leg, Thread/unthread left pants leg Pants- Performed by helper: Pull pants up/down Non-skid slipper socks- Performed by patient: Don/doff right sock, Don/doff left sock Non-skid slipper socks- Performed by helper: Don/doff right sock, Don/doff left sock Socks - Performed by patient: Don/doff right sock, Don/doff left sock(with sock aide) Socks - Performed by helper: Don/doff right sock, Don/doff left sock Shoes - Performed by patient: Fasten right, Fasten left, Don/doff right shoe, Don/doff left shoe Shoes - Performed by helper: Don/doff right shoe, Don/doff left shoe, Fasten right, Fasten left Assist for footwear: Partial/moderate assist Assist for lower body dressing: Assistive device Assistive Device Comment: Shoe horn + reacher  Function - Toileting Toileting activity did not occur: No continent bowel/bladder event Toileting steps completed by patient: Adjust clothing after toileting Toileting steps completed by helper: Adjust clothing prior to toileting, Performs perineal hygiene, Adjust clothing after toileting Toileting Assistive Devices: Grab bar or rail Assist level: Touching or steadying assistance (Pt.75%)  Function - Air cabin crew transfer activity did not occur: N/A Toilet transfer assistive device: Drop arm commode Mechanical lift: Stedy Assist level to toilet: 2 helpers Assist level from toilet: 2 helpers  Function - Chair/bed transfer Chair/bed transfer activity did not occur: N/A Chair/bed transfer method: Squat pivot Chair/bed transfer assist level: Touching or steadying assistance (Pt > 75%) Chair/bed transfer assistive device:  Armrests Mechanical lift: Stedy Chair/bed transfer details: Verbal cues for technique  Function - Locomotion: Wheelchair Will patient use wheelchair at discharge?: Yes Type: Manual Wheelchair activity did not occur: N/A Max wheelchair distance: 260ft Assist Level: No help, No cues, assistive device, takes more than reasonable amount of time Wheel 50 feet with 2 turns activity did not occur: N/A Assist Level: No help, No cues, assistive device, takes more than reasonable amount of time Wheel 150 feet activity did not occur: N/A Assist Level: No help, No cues, assistive device, takes more than reasonable amount of time Turns around,maneuvers to table,bed, and toilet,negotiates 3% grade,maneuvers on rugs and over doorsills: No Function - Locomotion: Ambulation Ambulation activity did not occur: N/A Assistive device: Walker-rolling Max distance: 12 Assist level: 2 helpers Walk 10 feet activity did not occur: N/A Assist level: Maximal assist (Pt 25 - 49%)(with support of maxi sky) Walk 50 feet with 2 turns activity did not occur: N/A Walk 150 feet activity did not occur: N/A Walk 10 feet on uneven surfaces activity did not occur: N/A  Function - Comprehension Comprehension: Auditory Comprehension assist level: Follows complex conversation/direction with no assist  Function - Expression Expression: Verbal Expression assistive device: Communication board Expression assist level: Expresses complex ideas: With no assist  Function - Social Interaction Social Interaction assist level: Interacts appropriately with others  with medication or extra time (anti-anxiety, antidepressant).  Function - Problem Solving Problem solving assist level: Solves complex problems: Recognizes & self-corrects  Function - Memory Memory assist level: More than reasonable amount of time Patient normally able to recall (first 3 days only): Current season, Staff names and faces, That he or she is in a  hospital, Location of own room  Medical Problem List and Plan: 1.Decreased functional mobilitysecondary to T10-11 osteomyelitis/epidural abscess with subsequent myelopathy/paraplegia, status post decompression and fusion T8-L3. Back brace when out of bed. Patient with history of multiple lumbar surgeries as well as spinal cord stimulator January 2019   Continue CIR  -working toward 5/18  2. DVT left soleal, right peroneal DVT's:.          Repeat dopplers with ongoing DVT's in bilateral peroneal   and left soleal   -coumadin for rx---INR   therapeutic      -no cross covg necessary     -recheck dopplers at outpt 3. Pain Management:Neurontin 600 mg 3 times daily, Robaxin   as needed   Lidoderm patch     -scheduled tylenol  -oxycodone prn.--discussed decreasing use of oxy when possible  -left shoulder ROM exercises, heat/sports cream. ?inject 4. Mood:Aricept 10 mg nightly, Namenda 10 mg twice daily, Effexor 150 mg twice daily, trazodone 100 mg nightly  -STM deficits are notable  -providing regular positive reinforcement re: prognosis  5. Neuropsych: This patientisfully capable of making decisions on hisown behalf. 6. Skin/Wound Care:staples out per NS 7. Fluids/Electrolytes/Nutrition:Routine I/O's  -supplementing potassium KCl 20 mEq/day.    Potassium level 4.1 on 5/10   8.ID/MSSA bacteremia.Intravenous Ancef 2 g every 8 hours through 05/22/2018 and stop as well as rifampin 300 mg twice daily.    -outpt Follow-up per infectious disease  -HHRN has spoke to patient re: abx admin at home 9.Acute on chronic anemia.     Hemoglobin  9.0 on 4/30, repeat 9.7 5/15  -hemorrhoid cream prn  10.Hypertension. Norvasc 10 mg daily   Vitals:   05/04/18 2127 05/05/18 0640  BP: 125/77 (!) 142/86  Pulse: 82 74  Resp: 18 12  Temp: 98.2 F (36.8 C) 98 F (36.7 C)  SpO2: 93% 96%    Borderline DBP's.  Introduced prinivil 5mg  daily with some improvement.  Continue current dosing   (on losartan PTA) 11.Hyperlipidemia. Pravachol 12.History of prostate cancer. Proscar 5 mg daily 13.Constipation.     -increased linzess to 290mg   -resumed senna-s  -sorbitol with results  -fleet enema prn  -had bm 5/15---needs to empty today  -have spoken to the patient about the fact that he will need to be proactive with bowels 14. Urine retention, occasional incontinence seems to be improved  ---Ua negative, ucx negative 15. Morbid obesity   Body mass index is 33.69 kg/m.  Diet and exercise education  Encourage weight loss to increase endurance and promote overall health 16. Sleep disturbance  Melatonin added to trazodone on 5/5 with good results.      [name], MD, [place] (Days) 66 A FACE TO [place] 05/05/2018, 8:46 AM

## 2018-05-06 LAB — PROTIME-INR
INR: 2.18
Prothrombin Time: 24.1 seconds — ABNORMAL HIGH (ref 11.4–15.2)

## 2018-05-06 LAB — CBC
HCT: 33.1 % — ABNORMAL LOW (ref 39.0–52.0)
Hemoglobin: 9.4 g/dL — ABNORMAL LOW (ref 13.0–17.0)
MCH: 23.7 pg — ABNORMAL LOW (ref 26.0–34.0)
MCHC: 28.4 g/dL — ABNORMAL LOW (ref 30.0–36.0)
MCV: 83.6 fL (ref 78.0–100.0)
Platelets: 588 10*3/uL — ABNORMAL HIGH (ref 150–400)
RBC: 3.96 MIL/uL — ABNORMAL LOW (ref 4.22–5.81)
RDW: 16.6 % — ABNORMAL HIGH (ref 11.5–15.5)
WBC: 6.6 10*3/uL (ref 4.0–10.5)

## 2018-05-06 MED ORDER — HEPARIN SOD (PORK) LOCK FLUSH 100 UNIT/ML IV SOLN
250.0000 [IU] | INTRAVENOUS | Status: AC | PRN
  Administered 2018-05-06: 250 [IU]

## 2018-05-06 MED ORDER — WARFARIN SODIUM 5 MG PO TABS
10.0000 mg | ORAL_TABLET | Freq: Once | ORAL | Status: AC
  Administered 2018-05-06: 10 mg via ORAL
  Filled 2018-05-06: qty 2

## 2018-05-06 MED ORDER — WARFARIN SODIUM 5 MG PO TABS: ORAL_TABLET | ORAL | 1 refills | Status: DC

## 2018-05-06 NOTE — Progress Notes (Signed)
Cody Velez is a 72 y.o. male 1946/07/20 [id number]  Subjective: No new complaints. No new problems. Slept well. Feeling OK.  Objective: Vital signs in last 24 hours: Temp:  [97.6 F (36.4 C)-98.6 F (37 C)] 97.6 F (36.4 C) (05/18 0518) Pulse Rate:  [83-85] 85 (05/18 0518) Resp:  [18-20] 18 (05/18 0518) BP: (102-139)/(47-81) 139/71 (05/18 0518) SpO2:  [95 %-97 %] 97 % (05/18 0518) Weight:  [275 lb 9.2 oz (125 kg)] 275 lb 9.2 oz (125 kg) (05/18 0518) Weight change: 6 lb 1.2 oz (2.756 kg) Last BM Date: 05/03/18  Intake/Output from previous day: 05/17 0701 - 05/18 0700 In: 740 [P.O.:720; I.V.:20] Out: 650 [Urine:650] Last cbgs: CBG (last 3)  No results for input(s): GLUCAP in the last 72 hours.   Physical Exam General: No apparent distress  Obese HEENT: not dry Lungs: Normal effort. Lungs clear to auscultation, no crackles or wheezes. Cardiovascular: Regular rate and rhythm, no edema Abdomen: S/NT/ND; BS(+) Musculoskeletal:  unchanged Neurological: No new neurological deficits Wounds: ok    Skin: clear  Aging changes Mental state: Alert, cooperative    Lab Results: BMET    Component Value Date/Time   NA 140 04/28/2018 0650   K 4.1 04/28/2018 0650   CL 103 04/28/2018 0650   CO2 28 04/28/2018 0650   GLUCOSE 82 04/28/2018 0650   BUN 6 04/28/2018 0650   CREATININE 0.82 04/28/2018 0650   CALCIUM 8.8 (L) 04/28/2018 0650   GFRNONAA >60 04/28/2018 0650   GFRAA >60 04/28/2018 0650   CBC    Component Value Date/Time   WBC 6.6 05/06/2018 0411   RBC 3.96 (L) 05/06/2018 0411   HGB 9.4 (L) 05/06/2018 0411   HCT 33.1 (L) 05/06/2018 0411   PLT 588 (H) 05/06/2018 0411   MCV 83.6 05/06/2018 0411   MCH 23.7 (L) 05/06/2018 0411   MCHC 28.4 (L) 05/06/2018 0411   RDW 16.6 (H) 05/06/2018 0411   LYMPHSABS 1.4 04/03/2018 1040   MONOABS 0.7 04/03/2018 1040   EOSABS 0.3 04/03/2018 1040   BASOSABS 0.0 04/03/2018 1040    Studies/Results: No results  found.  Medications: I have reviewed the patient's current medications.  Assessment/Plan:    1. Decreased functional mobilitysecondary to T10-11 osteomyelitis/epidural abscess with subsequent myelopathy/paraplegia, status post decompression and fusion T8-L3. Back brace when out of bed. Patient with history of multiple lumbar surgeries as well as spinal cord stimulator January 2019. D/c home 2. LBP. Oxycodone, Gabapentin, Lidoderm 3. Constiption. Linzess prn 4. HTN. Norvasc 5. Dyslipidemia - Pravachol 6. MSSA bacteremia - cont abx at home 7. Memory issues: Aricept, Namenda, Effexor     Length of stay, days: [place] , MD 05/06/2018, 8:02 AM

## 2018-05-06 NOTE — Progress Notes (Signed)
ANTICOAGULATION CONSULT NOTE - Follow Up Consult  Pharmacy Consult for warfarin Indication: DVT  No Known Allergies  Patient Measurements: Height: 6\' 3"  (190.5 cm) Weight: 275 lb 9.2 oz (125 kg) IBW/kg (Calculated) : 84.5   Vital Signs: Temp: 97.6 F (36.4 C) (05/18 0518) Temp Source: Oral (05/18 0518) BP: 139/71 (05/18 0518) Pulse Rate: 85 (05/18 0518)  Labs: Recent Labs    05/04/18 0500 05/05/18 0355 05/06/18 0411  HGB  --   --  9.4*  HCT  --   --  33.1*  PLT  --   --  588*  LABPROT 25.5* 25.7* 24.1*  INR 2.35 2.37 2.18    Estimated Creatinine Clearance: 117.7 mL/min (by C-G formula based on SCr of 0.82 mg/dL).   Assessment: 71 YOM transferred to CIR on 4/11 with MSSA bacteremia currently being treated with cefazolin and rifampin through 05/22/18 (per ID recs). DVT noted on doppler 4/12, decision to anticoagulate is complicated by rifampin drug interaction with all oral anticoagulants. Full anticoagulation with warfarin and lovenox bridge began 5/9. High doses of warfarin were used initially to overcome interaction with rifampin - INR was therapeutic after only 2 doses. Patient and family were educated on warfarin monitoring and advised to follow up within 1-2 weeks of completing rifampin, as warfarin will need to be adjusted. PO meal intake good.  INR is therapeutic today at 2.18 but has decreased from yesterday. Plan for discharge today. Based on warfarin doses last 7 days and drug interaction with rifampin, the patient may need a higher dose of warfarin than prescribed.   Goal of Therapy:  INR 2-3 Monitor platelets by anticoagulation protocol: Yes   Plan:  Give warfarin 10mg  today prior to discharge Adjust warfarin to 10mg  on Mondays and Fridays, 7.5mg  all other days.  Prescription updated. Discharge planning as outlined below by Nicole Cella on 5/17  Discharge planning:    [place] discharge summary note indicates that Dr. Asencion Noble will f/u for medical  management and [facility] nurse to check 1st INR on 05/09/18 , INR results to Dr. Willey Blade phone # [phone], fax # [phone].  Recommend to check INR at least once weekly until INR therapeutic on stable dose regimen for DVT treatment. And closer INR f/u after Rifampin ends 05/22/18 as noted above. Rifampin increases the metabolism of warfarin thus decreases warfarin effect> requiring higher dose of warfarin while taking rifampin.  When rifampin stops 05/22/18, will likely need a lower warfarin dose to keep INR 2-3.  Nicole Cella discussed with [facility] and Dr. Ria Comment office on 5/17.  Legrand Como, Pharm.D., BCPS, BCIDP Clinical Pharmacist Phone: [phone] or [phone] 05/06/2018, 8:17 AM

## 2018-05-06 NOTE — Progress Notes (Signed)
Patient discharged at 1107 to home with family and all belongings. Updated AVS given to patient with updated coumadin information from pharmacy. Patient received 10mg  coumadin before discharge and verbalized understanding that next dose will not be until tomorrow. Patient received all equipment.

## 2018-05-07 DIAGNOSIS — G061 Intraspinal abscess and granuloma: Secondary | ICD-10-CM | POA: Diagnosis not present

## 2018-05-07 DIAGNOSIS — I82491 Acute embolism and thrombosis of other specified deep vein of right lower extremity: Secondary | ICD-10-CM | POA: Diagnosis not present

## 2018-05-07 DIAGNOSIS — R7881 Bacteremia: Secondary | ICD-10-CM | POA: Diagnosis not present

## 2018-05-07 DIAGNOSIS — F329 Major depressive disorder, single episode, unspecified: Secondary | ICD-10-CM | POA: Diagnosis not present

## 2018-05-07 DIAGNOSIS — F4310 Post-traumatic stress disorder, unspecified: Secondary | ICD-10-CM | POA: Diagnosis not present

## 2018-05-07 DIAGNOSIS — R339 Retention of urine, unspecified: Secondary | ICD-10-CM | POA: Diagnosis not present

## 2018-05-07 DIAGNOSIS — Z452 Encounter for adjustment and management of vascular access device: Secondary | ICD-10-CM | POA: Diagnosis not present

## 2018-05-07 DIAGNOSIS — K219 Gastro-esophageal reflux disease without esophagitis: Secondary | ICD-10-CM | POA: Diagnosis not present

## 2018-05-07 DIAGNOSIS — B9561 Methicillin susceptible Staphylococcus aureus infection as the cause of diseases classified elsewhere: Secondary | ICD-10-CM | POA: Diagnosis not present

## 2018-05-07 DIAGNOSIS — G9589 Other specified diseases of spinal cord: Secondary | ICD-10-CM | POA: Diagnosis not present

## 2018-05-07 DIAGNOSIS — R413 Other amnesia: Secondary | ICD-10-CM | POA: Diagnosis not present

## 2018-05-07 DIAGNOSIS — Z792 Long term (current) use of antibiotics: Secondary | ICD-10-CM | POA: Diagnosis not present

## 2018-05-07 DIAGNOSIS — I2510 Atherosclerotic heart disease of native coronary artery without angina pectoris: Secondary | ICD-10-CM | POA: Diagnosis not present

## 2018-05-07 DIAGNOSIS — M4624 Osteomyelitis of vertebra, thoracic region: Secondary | ICD-10-CM | POA: Diagnosis not present

## 2018-05-07 DIAGNOSIS — D649 Anemia, unspecified: Secondary | ICD-10-CM | POA: Diagnosis not present

## 2018-05-07 DIAGNOSIS — I10 Essential (primary) hypertension: Secondary | ICD-10-CM | POA: Diagnosis not present

## 2018-05-07 DIAGNOSIS — Z5181 Encounter for therapeutic drug level monitoring: Secondary | ICD-10-CM | POA: Diagnosis not present

## 2018-05-07 DIAGNOSIS — M1990 Unspecified osteoarthritis, unspecified site: Secondary | ICD-10-CM | POA: Diagnosis not present

## 2018-05-07 DIAGNOSIS — I82492 Acute embolism and thrombosis of other specified deep vein of left lower extremity: Secondary | ICD-10-CM | POA: Diagnosis not present

## 2018-05-07 DIAGNOSIS — G8220 Paraplegia, unspecified: Secondary | ICD-10-CM | POA: Diagnosis not present

## 2018-05-07 DIAGNOSIS — Z6833 Body mass index (BMI) 33.0-33.9, adult: Secondary | ICD-10-CM | POA: Diagnosis not present

## 2018-05-07 DIAGNOSIS — Z7901 Long term (current) use of anticoagulants: Secondary | ICD-10-CM | POA: Diagnosis not present

## 2018-05-07 DIAGNOSIS — Z9181 History of falling: Secondary | ICD-10-CM | POA: Diagnosis not present

## 2018-05-07 DIAGNOSIS — G4733 Obstructive sleep apnea (adult) (pediatric): Secondary | ICD-10-CM | POA: Diagnosis not present

## 2018-05-08 ENCOUNTER — Emergency Department (HOSPITAL_COMMUNITY): Payer: Medicare Other

## 2018-05-08 ENCOUNTER — Inpatient Hospital Stay (HOSPITAL_COMMUNITY)
Admission: EM | Admit: 2018-05-08 | Discharge: 2018-05-22 | DRG: 853 | Disposition: A | Discharge status: Skilled Nursing Facility | Payer: Medicare Other | Attending: Internal Medicine | Admitting: Internal Medicine

## 2018-05-08 ENCOUNTER — Other Ambulatory Visit: Payer: Self-pay

## 2018-05-08 ENCOUNTER — Encounter (HOSPITAL_COMMUNITY): Payer: Self-pay | Admitting: Emergency Medicine

## 2018-05-08 DIAGNOSIS — G894 Chronic pain syndrome: Secondary | ICD-10-CM | POA: Diagnosis present

## 2018-05-08 DIAGNOSIS — G062 Extradural and subdural abscess, unspecified: Secondary | ICD-10-CM | POA: Diagnosis present

## 2018-05-08 DIAGNOSIS — G8220 Paraplegia, unspecified: Secondary | ICD-10-CM | POA: Diagnosis not present

## 2018-05-08 DIAGNOSIS — G061 Intraspinal abscess and granuloma: Secondary | ICD-10-CM | POA: Diagnosis present

## 2018-05-08 DIAGNOSIS — Z6835 Body mass index (BMI) 35.0-35.9, adult: Secondary | ICD-10-CM

## 2018-05-08 DIAGNOSIS — S8992XA Unspecified injury of left lower leg, initial encounter: Secondary | ICD-10-CM | POA: Diagnosis not present

## 2018-05-08 DIAGNOSIS — S8001XA Contusion of right knee, initial encounter: Secondary | ICD-10-CM | POA: Diagnosis not present

## 2018-05-08 DIAGNOSIS — R7881 Bacteremia: Secondary | ICD-10-CM | POA: Diagnosis present

## 2018-05-08 DIAGNOSIS — Z7982 Long term (current) use of aspirin: Secondary | ICD-10-CM

## 2018-05-08 DIAGNOSIS — A4101 Sepsis due to Methicillin susceptible Staphylococcus aureus: Secondary | ICD-10-CM | POA: Diagnosis not present

## 2018-05-08 DIAGNOSIS — E669 Obesity, unspecified: Secondary | ICD-10-CM | POA: Diagnosis present

## 2018-05-08 DIAGNOSIS — M545 Low back pain: Secondary | ICD-10-CM | POA: Diagnosis not present

## 2018-05-08 DIAGNOSIS — S300XXA Contusion of lower back and pelvis, initial encounter: Secondary | ICD-10-CM | POA: Diagnosis not present

## 2018-05-08 DIAGNOSIS — M11269 Other chondrocalcinosis, unspecified knee: Secondary | ICD-10-CM

## 2018-05-08 DIAGNOSIS — Z9181 History of falling: Secondary | ICD-10-CM

## 2018-05-08 DIAGNOSIS — M4654 Other infective spondylopathies, thoracic region: Secondary | ICD-10-CM | POA: Diagnosis not present

## 2018-05-08 DIAGNOSIS — Z79899 Other long term (current) drug therapy: Secondary | ICD-10-CM

## 2018-05-08 DIAGNOSIS — M546 Pain in thoracic spine: Secondary | ICD-10-CM | POA: Diagnosis not present

## 2018-05-08 DIAGNOSIS — E785 Hyperlipidemia, unspecified: Secondary | ICD-10-CM | POA: Diagnosis present

## 2018-05-08 DIAGNOSIS — M009 Pyogenic arthritis, unspecified: Secondary | ICD-10-CM

## 2018-05-08 DIAGNOSIS — M62838 Other muscle spasm: Secondary | ICD-10-CM | POA: Diagnosis present

## 2018-05-08 DIAGNOSIS — G4733 Obstructive sleep apnea (adult) (pediatric): Secondary | ICD-10-CM | POA: Diagnosis present

## 2018-05-08 DIAGNOSIS — G9340 Encephalopathy, unspecified: Secondary | ICD-10-CM | POA: Diagnosis not present

## 2018-05-08 DIAGNOSIS — S79911A Unspecified injury of right hip, initial encounter: Secondary | ICD-10-CM | POA: Diagnosis not present

## 2018-05-08 DIAGNOSIS — G253 Myoclonus: Secondary | ICD-10-CM | POA: Diagnosis present

## 2018-05-08 DIAGNOSIS — F329 Major depressive disorder, single episode, unspecified: Secondary | ICD-10-CM | POA: Diagnosis present

## 2018-05-08 DIAGNOSIS — S8002XA Contusion of left knee, initial encounter: Secondary | ICD-10-CM | POA: Diagnosis present

## 2018-05-08 DIAGNOSIS — R413 Other amnesia: Secondary | ICD-10-CM | POA: Diagnosis present

## 2018-05-08 DIAGNOSIS — M00061 Staphylococcal arthritis, right knee: Secondary | ICD-10-CM | POA: Diagnosis not present

## 2018-05-08 DIAGNOSIS — R296 Repeated falls: Secondary | ICD-10-CM

## 2018-05-08 DIAGNOSIS — I2510 Atherosclerotic heart disease of native coronary artery without angina pectoris: Secondary | ICD-10-CM | POA: Diagnosis present

## 2018-05-08 DIAGNOSIS — M4624 Osteomyelitis of vertebra, thoracic region: Secondary | ICD-10-CM | POA: Diagnosis not present

## 2018-05-08 DIAGNOSIS — S8991XA Unspecified injury of right lower leg, initial encounter: Secondary | ICD-10-CM | POA: Diagnosis not present

## 2018-05-08 DIAGNOSIS — Z9689 Presence of other specified functional implants: Secondary | ICD-10-CM

## 2018-05-08 DIAGNOSIS — M170 Bilateral primary osteoarthritis of knee: Secondary | ICD-10-CM | POA: Diagnosis present

## 2018-05-08 DIAGNOSIS — S299XXA Unspecified injury of thorax, initial encounter: Secondary | ICD-10-CM | POA: Diagnosis not present

## 2018-05-08 DIAGNOSIS — Z8546 Personal history of malignant neoplasm of prostate: Secondary | ICD-10-CM

## 2018-05-08 DIAGNOSIS — K219 Gastro-esophageal reflux disease without esophagitis: Secondary | ICD-10-CM | POA: Diagnosis present

## 2018-05-08 DIAGNOSIS — F0390 Unspecified dementia without behavioral disturbance: Secondary | ICD-10-CM | POA: Diagnosis present

## 2018-05-08 DIAGNOSIS — M25062 Hemarthrosis, left knee: Secondary | ICD-10-CM | POA: Diagnosis not present

## 2018-05-08 DIAGNOSIS — I10 Essential (primary) hypertension: Secondary | ICD-10-CM | POA: Diagnosis present

## 2018-05-08 DIAGNOSIS — W19XXXA Unspecified fall, initial encounter: Secondary | ICD-10-CM

## 2018-05-08 DIAGNOSIS — R52 Pain, unspecified: Secondary | ICD-10-CM | POA: Diagnosis not present

## 2018-05-08 DIAGNOSIS — Z981 Arthrodesis status: Secondary | ICD-10-CM

## 2018-05-08 DIAGNOSIS — Z87891 Personal history of nicotine dependence: Secondary | ICD-10-CM

## 2018-05-08 DIAGNOSIS — M11261 Other chondrocalcinosis, right knee: Secondary | ICD-10-CM | POA: Diagnosis present

## 2018-05-08 DIAGNOSIS — M25061 Hemarthrosis, right knee: Secondary | ICD-10-CM | POA: Diagnosis not present

## 2018-05-08 DIAGNOSIS — I82493 Acute embolism and thrombosis of other specified deep vein of lower extremity, bilateral: Secondary | ICD-10-CM | POA: Diagnosis present

## 2018-05-08 DIAGNOSIS — F4310 Post-traumatic stress disorder, unspecified: Secondary | ICD-10-CM | POA: Diagnosis present

## 2018-05-08 DIAGNOSIS — Z993 Dependence on wheelchair: Secondary | ICD-10-CM

## 2018-05-08 DIAGNOSIS — Z7901 Long term (current) use of anticoagulants: Secondary | ICD-10-CM

## 2018-05-08 LAB — CBC WITH DIFFERENTIAL/PLATELET
Basophils Absolute: 0 10*3/uL (ref 0.0–0.1)
Basophils Relative: 0 %
Eosinophils Absolute: 0.1 10*3/uL (ref 0.0–0.7)
Eosinophils Relative: 1 %
HCT: 34.5 % — ABNORMAL LOW (ref 39.0–52.0)
Hemoglobin: 10 g/dL — ABNORMAL LOW (ref 13.0–17.0)
Lymphocytes Relative: 14 %
Lymphs Abs: 1.6 10*3/uL (ref 0.7–4.0)
MCH: 23.9 pg — ABNORMAL LOW (ref 26.0–34.0)
MCHC: 29 g/dL — ABNORMAL LOW (ref 30.0–36.0)
MCV: 82.5 fL (ref 78.0–100.0)
Monocytes Absolute: 1.6 10*3/uL — ABNORMAL HIGH (ref 0.1–1.0)
Monocytes Relative: 13 %
Neutro Abs: 8.7 10*3/uL — ABNORMAL HIGH (ref 1.7–7.7)
Neutrophils Relative %: 72 %
Platelets: 579 10*3/uL — ABNORMAL HIGH (ref 150–400)
RBC: 4.18 MIL/uL — ABNORMAL LOW (ref 4.22–5.81)
RDW: 16.7 % — ABNORMAL HIGH (ref 11.5–15.5)
WBC: 12 10*3/uL — ABNORMAL HIGH (ref 4.0–10.5)

## 2018-05-08 LAB — COMPREHENSIVE METABOLIC PANEL
ALT: 7 U/L — ABNORMAL LOW (ref 17–63)
AST: 12 U/L — ABNORMAL LOW (ref 15–41)
Albumin: 2.7 g/dL — ABNORMAL LOW (ref 3.5–5.0)
Alkaline Phosphatase: 99 U/L (ref 38–126)
Anion gap: 8 (ref 5–15)
BUN: 10 mg/dL (ref 6–20)
CO2: 26 mmol/L (ref 22–32)
Calcium: 8.8 mg/dL — ABNORMAL LOW (ref 8.9–10.3)
Chloride: 100 mmol/L — ABNORMAL LOW (ref 101–111)
Creatinine, Ser: 0.79 mg/dL (ref 0.61–1.24)
GFR calc Af Amer: >60 mL/min (ref >60)
GFR calc non Af Amer: >60 mL/min (ref >60)
Glucose, Bld: 116 mg/dL — ABNORMAL HIGH (ref 65–99)
Potassium: 4 mmol/L (ref 3.5–5.1)
Sodium: 134 mmol/L — ABNORMAL LOW (ref 135–145)
Total Bilirubin: 0.5 mg/dL (ref 0.3–1.2)
Total Protein: 7.5 g/dL (ref 6.5–8.1)

## 2018-05-08 LAB — PROTIME-INR
INR: 2.82
Prothrombin Time: 29.4 seconds — ABNORMAL HIGH (ref 11.4–15.2)

## 2018-05-08 MED ORDER — GABAPENTIN 300 MG PO CAPS
600.0000 mg | ORAL_CAPSULE | Freq: Three times a day (TID) | ORAL | Status: DC
  Administered 2018-05-08 – 2018-05-11 (×10): 600 mg via ORAL
  Filled 2018-05-08 (×11): qty 2

## 2018-05-08 MED ORDER — POTASSIUM CHLORIDE CRYS ER 20 MEQ PO TBCR
20.0000 meq | EXTENDED_RELEASE_TABLET | Freq: Every day | ORAL | Status: DC
  Administered 2018-05-09 – 2018-05-22 (×14): 20 meq via ORAL
  Filled 2018-05-08 (×14): qty 1

## 2018-05-08 MED ORDER — HEPARIN SOD (PORK) LOCK FLUSH 100 UNIT/ML IV SOLN
500.0000 [IU] | Freq: Once | INTRAVENOUS | Status: AC
  Administered 2018-05-08: 500 [IU] via INTRAVENOUS
  Filled 2018-05-08: qty 5

## 2018-05-08 MED ORDER — SODIUM CHLORIDE 0.9 % IV SOLN
250.0000 mL | INTRAVENOUS | Status: DC | PRN
  Administered 2018-05-13 – 2018-05-20 (×2): 250 mL via INTRAVENOUS
  Administered 2018-05-22: 10 mL via INTRAVENOUS

## 2018-05-08 MED ORDER — VITAMIN B-12 1000 MCG PO TABS
1000.0000 ug | ORAL_TABLET | Freq: Every day | ORAL | Status: DC
  Administered 2018-05-09 – 2018-05-22 (×14): 1000 ug via ORAL
  Filled 2018-05-08 (×14): qty 1

## 2018-05-08 MED ORDER — SODIUM CHLORIDE 0.9% FLUSH
3.0000 mL | Freq: Two times a day (BID) | INTRAVENOUS | Status: DC
  Administered 2018-05-09 – 2018-05-22 (×12): 3 mL via INTRAVENOUS

## 2018-05-08 MED ORDER — CEFAZOLIN IV (FOR PTA / DISCHARGE USE ONLY): 2.0000 g | Freq: Three times a day (TID) | INTRAVENOUS | Status: DC

## 2018-05-08 MED ORDER — DEXTROSE 5 % IV SOLN
3.0000 g | Freq: Once | INTRAVENOUS | Status: AC
  Administered 2018-05-08: 3 g via INTRAVENOUS
  Filled 2018-05-08: qty 3

## 2018-05-08 MED ORDER — OXYCODONE-ACETAMINOPHEN 5-325 MG PO TABS: 2.0000 | ORAL_TABLET | Freq: Once | ORAL | Status: DC

## 2018-05-08 MED ORDER — SODIUM CHLORIDE 0.9 % IV SOLN
INTRAVENOUS | Status: DC
  Administered 2018-05-08: 20:00:00 via INTRAVENOUS

## 2018-05-08 MED ORDER — OXYCODONE HCL 5 MG PO TABS
10.0000 mg | ORAL_TABLET | ORAL | Status: DC | PRN
  Administered 2018-05-08 – 2018-05-22 (×36): 10 mg via ORAL
  Filled 2018-05-08 (×39): qty 2

## 2018-05-08 MED ORDER — DOCUSATE SODIUM 100 MG PO CAPS
200.0000 mg | ORAL_CAPSULE | Freq: Two times a day (BID) | ORAL | Status: DC
  Administered 2018-05-08 – 2018-05-22 (×28): 200 mg via ORAL
  Filled 2018-05-08 (×28): qty 2

## 2018-05-08 MED ORDER — RIFAMPIN 300 MG PO CAPS
300.0000 mg | ORAL_CAPSULE | Freq: Two times a day (BID) | ORAL | Status: DC
  Administered 2018-05-09 – 2018-05-18 (×21): 300 mg via ORAL
  Filled 2018-05-08 (×31): qty 1

## 2018-05-08 MED ORDER — LINACLOTIDE 145 MCG PO CAPS
290.0000 ug | ORAL_CAPSULE | Freq: Every day | ORAL | Status: DC
  Administered 2018-05-09 – 2018-05-22 (×15): 290 ug via ORAL
  Filled 2018-05-08 (×4): qty 2
  Filled 2018-05-08: qty 1
  Filled 2018-05-08: qty 2
  Filled 2018-05-08: qty 1
  Filled 2018-05-08 (×11): qty 2

## 2018-05-08 MED ORDER — MELATONIN 3 MG PO TABS
1.5000 mg | ORAL_TABLET | Freq: Every day | ORAL | Status: DC
  Administered 2018-05-09 – 2018-05-21 (×13): 1.5 mg via ORAL
  Filled 2018-05-08 (×17): qty 0.5

## 2018-05-08 MED ORDER — OXYCODONE-ACETAMINOPHEN 5-325 MG PO TABS
2.0000 | ORAL_TABLET | Freq: Once | ORAL | Status: AC
  Administered 2018-05-08: 2 via ORAL
  Filled 2018-05-08: qty 2

## 2018-05-08 MED ORDER — SODIUM CHLORIDE 0.9% FLUSH
3.0000 mL | INTRAVENOUS | Status: DC | PRN
  Administered 2018-05-11 (×2): 3 mL via INTRAVENOUS
  Filled 2018-05-08 (×2): qty 3

## 2018-05-08 MED ORDER — CEFAZOLIN SODIUM-DEXTROSE 2-4 GM/100ML-% IV SOLN
2.0000 g | Freq: Three times a day (TID) | INTRAVENOUS | Status: DC
Start: 2018-05-09 — End: 2018-05-18
  Administered 2018-05-09 – 2018-05-18 (×27): 2 g via INTRAVENOUS
  Filled 2018-05-08 (×43): qty 100

## 2018-05-08 MED ORDER — MEMANTINE HCL 10 MG PO TABS
10.0000 mg | ORAL_TABLET | Freq: Two times a day (BID) | ORAL | Status: DC
  Administered 2018-05-08 – 2018-05-22 (×28): 10 mg via ORAL
  Filled 2018-05-08 (×28): qty 1

## 2018-05-08 MED ORDER — FLUTICASONE PROPIONATE 50 MCG/ACT NA SUSP
1.0000 | Freq: Every day | NASAL | Status: DC | PRN
  Filled 2018-05-08: qty 16

## 2018-05-08 MED ORDER — LATANOPROST 0.005 % OP SOLN
1.0000 [drp] | Freq: Every day | OPHTHALMIC | Status: DC
  Administered 2018-05-09 – 2018-05-21 (×13): 1 [drp] via OPHTHALMIC
  Filled 2018-05-08 (×3): qty 2.5

## 2018-05-08 MED ORDER — LISINOPRIL 5 MG PO TABS
5.0000 mg | ORAL_TABLET | Freq: Every day | ORAL | Status: DC
  Administered 2018-05-09 – 2018-05-22 (×14): 5 mg via ORAL
  Filled 2018-05-08 (×15): qty 1

## 2018-05-08 MED ORDER — PRAVASTATIN SODIUM 20 MG PO TABS
10.0000 mg | ORAL_TABLET | Freq: Every evening | ORAL | Status: DC
  Administered 2018-05-08 – 2018-05-21 (×14): 10 mg via ORAL
  Filled 2018-05-08 (×14): qty 1

## 2018-05-08 MED ORDER — DONEPEZIL HCL 5 MG PO TABS
10.0000 mg | ORAL_TABLET | Freq: Every day | ORAL | Status: DC
  Administered 2018-05-08 – 2018-05-21 (×14): 10 mg via ORAL
  Filled 2018-05-08 (×14): qty 2

## 2018-05-08 MED ORDER — ASPIRIN 81 MG PO CHEW
81.0000 mg | CHEWABLE_TABLET | Freq: Every day | ORAL | Status: DC
  Administered 2018-05-09 – 2018-05-22 (×14): 81 mg via ORAL
  Filled 2018-05-08 (×14): qty 1

## 2018-05-08 MED ORDER — CEFAZOLIN SODIUM-DEXTROSE 2-4 GM/100ML-% IV SOLN
INTRAVENOUS | Status: AC
  Filled 2018-05-08: qty 100

## 2018-05-08 MED ORDER — VENLAFAXINE HCL ER 75 MG PO CP24
150.0000 mg | ORAL_CAPSULE | Freq: Two times a day (BID) | ORAL | Status: DC
  Administered 2018-05-08 – 2018-05-22 (×28): 150 mg via ORAL
  Filled 2018-05-08 (×28): qty 2

## 2018-05-08 MED ORDER — AMLODIPINE BESYLATE 10 MG PO TABS
10.0000 mg | ORAL_TABLET | Freq: Every day | ORAL | Status: DC
  Administered 2018-05-09 – 2018-05-22 (×14): 10 mg via ORAL
  Filled 2018-05-08 (×3): qty 1
  Filled 2018-05-08: qty 2
  Filled 2018-05-08 (×4): qty 1
  Filled 2018-05-08: qty 2
  Filled 2018-05-08 (×2): qty 1
  Filled 2018-05-08: qty 2
  Filled 2018-05-08: qty 1
  Filled 2018-05-08: qty 2

## 2018-05-08 MED ORDER — FINASTERIDE 5 MG PO TABS
5.0000 mg | ORAL_TABLET | Freq: Every day | ORAL | Status: DC
  Administered 2018-05-09 – 2018-05-22 (×14): 5 mg via ORAL
  Filled 2018-05-08 (×17): qty 1

## 2018-05-08 MED ORDER — TRAZODONE HCL 100 MG PO TABS
100.0000 mg | ORAL_TABLET | Freq: Every day | ORAL | Status: DC
  Administered 2018-05-08 – 2018-05-21 (×14): 100 mg via ORAL
  Filled 2018-05-08 (×3): qty 1
  Filled 2018-05-08 (×2): qty 2
  Filled 2018-05-08: qty 1
  Filled 2018-05-08: qty 2
  Filled 2018-05-08 (×4): qty 1
  Filled 2018-05-08: qty 2
  Filled 2018-05-08 (×2): qty 1

## 2018-05-08 MED ORDER — METHOCARBAMOL 500 MG PO TABS
500.0000 mg | ORAL_TABLET | Freq: Four times a day (QID) | ORAL | Status: DC
  Administered 2018-05-08 – 2018-05-16 (×29): 500 mg via ORAL
  Filled 2018-05-08 (×30): qty 1

## 2018-05-08 NOTE — ED Triage Notes (Signed)
PT was released back home from skilled therapy rehab on 05/06/18. PT reports he slipped and fell on 5/18 and fell again this morning while trying to get out of bed. PT c/o lower back pain radiating down both legs.

## 2018-05-08 NOTE — H&P (Signed)
History and Physical    KHYRIN TREVATHAN QBH:[id number] DOB: 1946/01/22 DOA: 05/08/2018  PCP: Asencion Noble, MD  Patient coming from: Home  Chief Complaint: Falling  HPI: ENGLISH CRAIGHEAD is a 72 y.o. male with medical history significant of obstructive sleep apnea, hypertension, obesity, chronic back pain with multiple 3 previous surgeries who recently had an epidural abscess causing paraplegia surgically decompressed on March 28, 2018 has been on 8 weeks duration of cefazolin and rifampin until May 22, 2018.  Patient has been in short-term rehab and was just discharged this past Saturday.  He went home and since being home he is fallen several times.  Family is being him back in due to his frequent falls and the need for further rehab versus skilled nursing facility placement.  They are unaware why he was discharged from the rehab center on Saturday but suspect is because he ran out of insurance days but they are not sure.  They report that he is barely able to take a couple of steps without 2-3  people helping him with a walker.  When he had his injury initially he could not move his legs at all.  So he had had a lot of improvement with being able to move his legs and get to the point where he can take a couple steps.  But is not in any shape at this point to be able to get up and around at home.  Patient is being referred for admission for again further evaluation for more rehabilitation.  Review of Systems: As per HPI otherwise 10 point review of systems negative.   Past Medical History:  Diagnosis Date  . Allergic rhinitis   . Arthritis   . Asthma    as a child  . B12 deficiency   . Cancer [facility])    prostate  . Cervicogenic headache 01/28/2016  . Chronic back pain   . Coronary atherosclerosis of native coronary artery    Mild nonobstructive CAD at catheterization January 2015  . Depression   . Essential hypertension, benign   . Falls   . GERD (gastroesophageal reflux disease)   .  History of cerebrovascular disease 07/23/2015  . History of pneumonia 02/2011  . Hyperlipidemia   . Kidney stone   . Memory difficulty 07/23/2015  . OSA (obstructive sleep apnea)    CPAP - Dr. [name]  . Prostate cancer ([place])   . PTSD (post-traumatic stress disorder)    [place]  . Rectal bleeding     Past Surgical History:  Procedure Laterality Date  . APPLICATION OF ROBOTIC ASSISTANCE FOR SPINAL PROCEDURE N/A 03/28/2018   Procedure: APPLICATION OF ROBOTIC ASSISTANCE FOR SPINAL PROCEDURE;  Surgeon: Kristeen Miss, MD;  Location: [place];  Service: Neurosurgery;  Laterality: N/A;  . BACK SURGERY  02/14/12   lumbar OR #7; "today redid L1L2; replaced screws; added bone from hip"  . BILATERAL KNEE ARTHROSCOPY    . COLONOSCOPY  10/15/2008   Dr. [name]: tubular adenoma   . COLONOSCOPY  12/17/2003   OXB:DZHGDJ rectal and colon  . COLONOSCOPY N/A 09/05/2014   Procedure: COLONOSCOPY;  Surgeon: Daneil Dolin, MD;  [facility];  Service: Endoscopy;  Laterality: N/A;  7:30-rescheduled 9/17 to [place] notified pt  . CYSTOSCOPY WITH STENT PLACEMENT Right 01/27/2016   Procedure: CYSTOSCOPY WITH STENT PLACEMENT;  Surgeon: [name], MD;  Location: [facility];  Service: Urology;  Laterality: Right;  . CYSTOSCOPY/RETROGRADE/URETEROSCOPY/STONE EXTRACTION WITH BASKET Right 01/27/2016   Procedure:  CYSTOSCOPY, RIGHT RETROGRADE, RIGHT URETEROSCOPY, STONE EXTRACTION ;  Surgeon: [name], MD;  Location: [facility];  Service: Urology;  Laterality: Right;  . ESOPHAGOGASTRODUODENOSCOPY  10/15/2008     Dr Rourk:Schatzki's ring status post dilation and disruption via 48 F Maloney dilator/ otherwise unremarkable esophagus, small hiatal hernia, multiple fundal gland polyps not manipulated, gastritis, negative H.pylori  . ESOPHAGOGASTRODUODENOSCOPY  06/21/02   MQK:MMNOT sliding hiatal hernia with mild changes of reflux esophagitis limited to gastroesophageal junction.  Noncritical ring at distal  esophagus, 3 cm proximal to gastroesophageal junction/Antral gastritis  . [place]   "broke face playing softball"  . FRACTURE SURGERY     "left wrist; broke it; took spur off"  . HOLMIUM LASER APPLICATION Right 06/25/1164   Procedure: HOLMIUM LASER APPLICATION;  Surgeon: [name], MD;  Location: [facility];  Service: Urology;  Laterality: Right;  . LEFT HEART CATHETERIZATION WITH CORONARY ANGIOGRAM N/A 12/27/2013   Procedure: LEFT HEART CATHETERIZATION WITH CORONARY ANGIOGRAM;  Surgeon: [name], MD;  Location: [facility] CATH LAB;  Service: Cardiovascular;  Laterality: N/A;  . neck epidural    . POSTERIOR LUMBAR FUSION 4 LEVEL N/A 03/28/2018   Procedure: Thoracic eight -Lumbar two- FIXATION WITH SCREW PLACEMENT, DECOMPRESSION Thoracic ten-Thoracic eleven  FOR OSTEOMYELITIS;  Surgeon: Kristeen Miss, MD;  Location: [place];  Service: Neurosurgery;  Laterality: N/A;  . SHOULDER SURGERY Bilateral   . TEE WITHOUT CARDIOVERSION N/A 03/21/2018   Procedure: TRANSESOPHAGEAL ECHOCARDIOGRAM (TEE) WITH PROPOFOL;  Surgeon: [name], MD;  [facility];  Service: Cardiovascular;  Laterality: N/A;     reports that he quit smoking about 59 years ago. His smoking use included cigarettes. His smokeless tobacco use includes chew. He reports that he drinks alcohol. He reports that he does not use drugs.  No Known Allergies  Family History  Problem Relation Age of Onset  . Emphysema Father   . Heart failure Father   . Lung cancer Father   . CAD Father   . Colon cancer Mother   . Stroke Mother   . Breast cancer Mother   . Stroke Sister   . Heart attack Brother   . Dementia Paternal Uncle   . Emphysema Maternal Grandmother   . Stroke Maternal Grandmother   . Asthma Other        grandson  . Heart disease Paternal Grandfather   . Anesthesia problems Neg Hx   . Hypotension Neg Hx   . Malignant hyperthermia Neg Hx   . Pseudochol deficiency Neg Hx     Prior to  Admission medications   Medication Sig Start Date End Date Taking? Authorizing Provider  amLODipine (NORVASC) 10 MG tablet Take 1 tablet (10 mg total) by mouth daily. 05/05/18  Yes Angiulli, Lavon Paganini, PA-C  aspirin 81 MG tablet Take 81 mg by mouth daily.   Yes [provider]  Baclofen 5 MG TABS Take 5 mg by mouth 3 (three) times daily as needed for muscle spasms. 05/05/18  Yes Angiulli, Lavon Paganini, PA-C  ceFAZolin (ANCEF) IVPB Inject 2 g into the vein every 8 (eight) hours for 18 days. Indication:  MSSA Bacteremia and extensive epidural abscess discitis of thoracic spine Last Day of Therapy: 05/22/2018 Labs - Once weekly:  CBC/D and BMP, Labs - Every other week:  ESR and CRP 05/05/18 05/23/18 Yes Love, Ivan Anchors, PA-C  cycloSPORINE (RESTASIS) 0.05 % ophthalmic emulsion 1 DROP INTO BOTH EYES TWICE A DAY FOR DRY EYES 12/02/17  12/03/18 Yes [provider]  docusate sodium (COLACE) 100 MG capsule Take 200 mg by mouth 2 (two) times daily.   Yes [provider]  donepezil (ARICEPT) 10 MG tablet Take 1 tablet (10 mg total) by mouth at bedtime. 05/05/18  Yes Angiulli, Lavon Paganini, PA-C  finasteride (PROSCAR) 5 MG tablet Take 1 tablet (5 mg total) by mouth daily. Reported on 05/04/2016 05/05/18  Yes Angiulli, Lavon Paganini, PA-C  fluticasone [facility]) 50 MCG/ACT nasal spray Place 1 spray into both nostrils daily as needed for allergies.    Yes [provider]  gabapentin (NEURONTIN) 300 MG capsule Take 2 capsules (600 mg total) by mouth 3 (three) times daily. 05/05/18  Yes Angiulli, Lavon Paganini, PA-C  latanoprost (XALATAN) 0.005 % ophthalmic solution Place 1 drop into both eyes at bedtime.  02/12/18  Yes [provider]  Melatonin 3 MG TABS Take 0.5 tablets (1.5 mg total) by mouth at bedtime. 05/05/18  Yes Angiulli, Lavon Paganini, PA-C  memantine (NAMENDA) 10 MG tablet Take 1 tablet (10 mg total) by mouth 2 (two) times daily. 05/05/18  Yes Angiulli, Lavon Paganini, PA-C  methocarbamol (ROBAXIN) 500  MG tablet Take 1 tablet (500 mg total) by mouth 4 (four) times daily. 05/05/18  Yes Angiulli, Lavon Paganini, PA-C  omeprazole (PRILOSEC) 20 MG capsule Take 2 capsules (40 mg total) by mouth daily. 05/05/18  Yes Angiulli, Lavon Paganini, PA-C  oxyCODONE 10 MG TABS Take 1 tablet (10 mg total) by mouth every 4 (four) hours as needed for moderate pain or severe pain. 05/05/18  Yes Angiulli, Lavon Paganini, PA-C  pravastatin (PRAVACHOL) 10 MG tablet Take 1 tablet (10 mg total) by mouth every evening. 05/05/18  Yes Angiulli, Lavon Paganini, PA-C  rifampin (RIFADIN) 300 MG capsule Continue through 05/22/2018 and stop Patient taking differently: Take 300 mg by mouth every 12 (twelve) hours. Continue through 05/22/2018 and stop 05/05/18  Yes Angiulli, Lavon Paganini, PA-C  traZODone (DESYREL) 100 MG tablet Take 1 tablet (100 mg total) by mouth at bedtime. 05/05/18  Yes Angiulli, Lavon Paganini, PA-C  venlafaxine XR (EFFEXOR-XR) 150 MG 24 hr capsule Take 1 capsule (150 mg total) by mouth 2 (two) times daily. 05/05/18  Yes Angiulli, Lavon Paganini, PA-C  vitamin B-12 (CYANOCOBALAMIN) 1000 MCG tablet Take 1 tablet (1,000 mcg total) by mouth daily. 05/05/18  Yes Angiulli, Lavon Paganini, PA-C  warfarin (COUMADIN) 5 MG tablet Take 1.5 tablets (7.34m) on Sunday, Tuesday, Wednesday, Thursday, and Saturday.  Take 2 tablets (174m on Monday and Friday. 05/06/18  Yes SwMeredith StaggersMD  lidocaine (LIDODERM) 5 % Place 1 patch onto the skin daily. Remove & Discard patch within 12 hours or as directed by MD Patient not taking: Reported on 05/08/2018 05/05/18   Angiulli, DaLavon PaganiniPA-C  linaclotide (LEncompass Health [facility] Rehabilitation290 MCG CAPS capsule Take 1 capsule (290 mcg total) by mouth daily before breakfast. 05/05/18   Angiulli, DaLavon PaganiniPA-C  lisinopril (PRINIVIL,ZESTRIL) 5 MG tablet Take 1 tablet (5 mg total) by mouth daily. 05/05/18   Angiulli, DaLavon PaganiniPA-C  potassium chloride SA (K-DUR,KLOR-CON) 20 MEQ tablet Take 1 tablet (20 mEq total) by mouth daily. 05/05/18   AnCathlyn ParsonsPA-C     Physical Exam: Vitals:   05/08/18 1800 05/08/18 1815 05/08/18 1830 05/08/18 2000  BP: 127/81  127/74 116/75  Pulse: 95 95 97 98  Resp:      Temp:      TempSrc:      SpO2: 92% 93% 92% 95%  Weight:      Height:          Constitutional: NAD, calm, comfortable  PI CC to right arm looks good Vitals:   05/08/18 1800 05/08/18 1815 05/08/18 1830 05/08/18 2000  BP: 127/81  127/74 116/75  Pulse: 95 95 97 98  Resp:      Temp:      TempSrc:      SpO2: 92% 93% 92% 95%  Weight:      Height:       Eyes: PERRL, lids and conjunctivae normal ENMT: Mucous membranes are moist. Posterior pharynx clear of any exudate or lesions.Normal dentition.  Neck: normal, supple, no masses, no thyromegaly Respiratory: clear to auscultation bilaterally, no wheezing, no crackles. Normal respiratory effort. No accessory muscle use.  Cardiovascular: Regular rate and rhythm, no murmurs / rubs / gallops. No extremity edema. 2+ pedal pulses. No carotid bruits.  Abdomen: no tenderness, no masses palpated. No hepatosplenomegaly. Bowel sounds positive.  Musculoskeletal: no clubbing / cyanosis. No joint deformity upper and lower extremities. Good ROM, no contractures. Normal muscle tone.  Skin: no rashes, lesions, ulcers. No induration Psychiatric: Normal judgment and insight. Alert and oriented x 3. Normal mood.    Labs on Admission: I have personally reviewed following labs and imaging studies  CBC: Recent Labs  Lab 05/03/18 0454 05/06/18 0411 05/08/18 1839  WBC 6.9 6.6 12.0*  NEUTROABS  --   --  8.7*  HGB 9.7* 9.4* 10.0*  HCT 33.4* 33.1* 34.5*  MCV 84.3 83.6 82.5  PLT 591* 588* 643*   Basic Metabolic Panel: Recent Labs  Lab 05/08/18 1839  NA 134*  K 4.0  CL 100*  CO2 26  GLUCOSE 116*  BUN 10  CREATININE 0.79  CALCIUM 8.8*   GFR: Estimated Creatinine Clearance: 120.5 mL/min (by C-G formula based on SCr of 0.79 mg/dL). Liver Function Tests: Recent Labs  Lab 05/08/18 1839  AST 12*   ALT 7*  ALKPHOS 99  BILITOT 0.5  PROT 7.5  ALBUMIN 2.7*   No results for input(s): LIPASE, AMYLASE in the last 168 hours. No results for input(s): AMMONIA in the last 168 hours. Coagulation Profile: Recent Labs  Lab 05/03/18 0454 05/04/18 0500 05/05/18 0355 05/06/18 0411 05/08/18 1839  INR 2.34 2.35 2.37 2.18 2.82   Cardiac Enzymes: No results for input(s): CKTOTAL, CKMB, CKMBINDEX, TROPONINI in the last 168 hours. BNP (last 3 results) No results for input(s): PROBNP in the last 8760 hours. HbA1C: No results for input(s): HGBA1C in the last 72 hours. CBG: No results for input(s): GLUCAP in the last 168 hours. Lipid Profile: No results for input(s): CHOL, HDL, LDLCALC, TRIG, CHOLHDL, LDLDIRECT in the last 72 hours. Thyroid Function Tests: No results for input(s): TSH, T4TOTAL, FREET4, T3FREE, THYROIDAB in the last 72 hours. Anemia Panel: No results for input(s): VITAMINB12, FOLATE, FERRITIN, TIBC, IRON, RETICCTPCT in the last 72 hours. Urine analysis:    Component Value Date/Time   COLORURINE AMBER (A) 04/05/2018 1621   APPEARANCEUR CLEAR 04/05/2018 1621   LABSPEC 1.029 04/05/2018 1621   PHURINE 5.0 04/05/2018 1621   GLUCOSEU NEGATIVE 04/05/2018 1621   HGBUR NEGATIVE 04/05/2018 1621   BILIRUBINUR MODERATE (A) 04/05/2018 1621   KETONESUR NEGATIVE 04/05/2018 1621   PROTEINUR 30 (A) 04/05/2018 1621   UROBILINOGEN 0.2 03/05/2011 1909   NITRITE NEGATIVE 04/05/2018 1621   LEUKOCYTESUR NEGATIVE 04/05/2018 1621   Sepsis Labs: !!!!!!!!!!!!!!!!!!!!!!!!!!!!!!!!!!!!!!!!!!!! _0 (procalcitonin:4,lacticidven:4) )No results found for this or any previous visit (from the past 240 hour(s)).  Radiological Exams on Admission: Dg Lumbar Spine 2-3 Views  Result Date: 05/08/2018 CLINICAL DATA:  Low back pain after a fall. EXAM: LUMBAR SPINE - 2-3 VIEW COMPARISON:  Spot fluoro films from 03/28/2018. FINDINGS: Patient is status post extensive thoracolumbar fusion posteriorly  with incomplete visualization of the thoracolumbar fusion hardware. Image portions of the hardware show no complicating features. Interbody cages are seen at L4-5 and L5-S1. Bones are diffusely demineralized. Although no definite compression deformity is evident, lateral film is limited in utility given the demineralization. IMPRESSION: No evidence for an acute fracture on this limited two-view study. Electronically Signed   By: Misty Stanley M.D.   On: 05/08/2018 16:58   Dg Knee 1-2 Views Left  Result Date: 05/08/2018 CLINICAL DATA:  [place] Saturday landing on bilateral knees. EXAM: RIGHT KNEE - 1-2 VIEW; LEFT KNEE - 1-2 VIEW COMPARISON:  MRI of the LEFT knee November 14, 2009 FINDINGS: LEFT: No acute fracture deformity or dislocation. Moderate to severe tricompartmental joint space narrowing with periarticular sclerosis and marginal spurring. Osteopenia without destructive bony lesions. Small suprapatellar joint effusion. Mild vascular calcifications. RIGHT: No acute fracture deformity dislocation. Moderate tricompartmental joint space narrowing with periarticular sclerosis and marginal spurring. No destructive bony lesions. Osteopenia. Soft tissue planes are nonsuspicious. IMPRESSION: 1. No acute fracture deformity dislocation. Small LEFT suprapatellar joint effusion. 2. Moderate RIGHT and moderate to severe LEFT tricompartmental osteoarthrosis. Electronically Signed   By: Elon Alas M.D.   On: 05/08/2018 16:55   Dg Knee 1-2 Views Right  Result Date: 05/08/2018 CLINICAL DATA:  [place] Saturday landing on bilateral knees. EXAM: RIGHT KNEE - 1-2 VIEW; LEFT KNEE - 1-2 VIEW COMPARISON:  MRI of the LEFT knee November 14, 2009 FINDINGS: LEFT: No acute fracture deformity or dislocation. Moderate to severe tricompartmental joint space narrowing with periarticular sclerosis and marginal spurring. Osteopenia without destructive bony lesions. Small suprapatellar joint effusion. Mild vascular calcifications. RIGHT:  No acute fracture deformity dislocation. Moderate tricompartmental joint space narrowing with periarticular sclerosis and marginal spurring. No destructive bony lesions. Osteopenia. Soft tissue planes are nonsuspicious. IMPRESSION: 1. No acute fracture deformity dislocation. Small LEFT suprapatellar joint effusion. 2. Moderate RIGHT and moderate to severe LEFT tricompartmental osteoarthrosis. Electronically Signed   By: Elon Alas M.D.   On: 05/08/2018 16:55   Dg Chest Portable 1 View  Result Date: 05/08/2018 CLINICAL DATA:  Verify PICC line placement. EXAM: PORTABLE CHEST 1 VIEW COMPARISON:  Apr 25, 2018 FINDINGS: A right PICC line is identified. The distal tip is difficult to visualize but appears to terminate in the central SVC. No change in the cardiomediastinal silhouette. No pneumothorax. No other acute interval changes. IMPRESSION: The right PICC line appears to be in good position. The distal tip is difficult to visualize with confidence but appears to be in the central SVC. Electronically Signed   By: Dorise Bullion III M.D   On: 05/08/2018 16:03   Dg Hip Unilat With Pelvis 2-3 Views Right  Result Date: 05/08/2018 CLINICAL DATA:  Fall, right hip pain EXAM: DG HIP (WITH OR WITHOUT PELVIS) 2-3V RIGHT COMPARISON:  None. FINDINGS: No fracture or dislocation is seen. Bilateral hip joint spaces are preserved. Visualized bony pelvis appears intact. Degenerative changes at L5-S1. IMPRESSION: Negative. Electronically Signed   By: Julian Hy M.D.   On: 05/08/2018 16:53    Old chart reviewed Case discussed with Dr. Roderic Palau in the ED   Assessment/Plan 72 year old male with recent epidural abscess just sent home from rehab on Saturday with frequent  falls Principal Problem:   Falls frequently-obtain physical therapy evaluation here.  What patient is reporting and the family is reporting is much different than what is documented per the discharge summary and notes from the rehab center.  In  the rehab center notes it says he had been needing minimal assistance but this is not with the family is describing.  Will obtain case management also to see if he qualifies for any further rehab or skilled nursing rehab.  Family has been prepared that this may be a situation where he has run out of insurance days for rehab and may have to start paying out-of-pocket.  We will have to wait to see what his insurance pays for the morning and the results of his physical therapy evaluation.  Active Problems:   Memory difficulty-noted   Chronic pain syndrome-continue home meds   Obesity-noted   Epidural abscess-continue IV cefazolin and rifampin   Paraplegia (HCC)-this seems to be improving with rehab DVT-continue Coumadin per pharmacy consult      DVT prophylaxis: Coumadin Code Status: Full Family Communication: Wife and daughters Disposition Plan: Per day team Consults called: None Admission status: Observation  Arieona Swaggerty A MD [facility]  If 7PM-7AM, please contact night-coverage [email or web address] Password [facility]  05/08/2018, 8:27 PM

## 2018-05-08 NOTE — Progress Notes (Signed)
[facility]  Active patient with [facility] on home IV ABX. Just discharged home on Saturday from [place].  [facility] hospital team will follow Cody Velez while here to support transition home when ordered.  If patient discharges after hours, please call [phone].   [name] 05/08/2018, 12:43 PM

## 2018-05-08 NOTE — ED Notes (Signed)
Dr Shanon Brow in to talk with pt/family.

## 2018-05-08 NOTE — Progress Notes (Signed)
ANTICOAGULATION CONSULT NOTE - initial Consult  Pharmacy Consult for warfarin Indication: DVT  No Known Allergies  Patient Measurements: Height: 6\' 3"  (190.5 cm) Weight: 275 lb (124.7 kg) IBW/kg (Calculated) : 84.5   Vital Signs: Temp: 98 F (36.7 C) (05/20 1222) Temp Source: Oral (05/20 1222) BP: 116/75 (05/20 2000) Pulse Rate: 98 (05/20 2000)  Labs: Recent Labs    05/06/18 0411 05/08/18 1839  HGB 9.4* 10.0*  HCT 33.1* 34.5*  PLT 588* 579*  LABPROT 24.1* 29.4*  INR 2.18 2.82  CREATININE  --  0.79    Estimated Creatinine Clearance: 120.5 mL/min (by C-G formula based on SCr of 0.79 mg/dL).   Assessment: Cody Velez  Recently discharged from [facility] with MSSA bacteremia currently being treated with cefazolin and rifampin through 05/22/18 (per ID recs). DVT noted on doppler 4/12, decision to anticoagulate is complicated by rifampin drug interaction with all oral anticoagulants. INR is therapeutic today at 2.82, which is higher than when discharged from [facility] on 5/18. Patient has already taken dose for today. Home regimen is warfarin 10mg  on Mondays and Fridays, 7.5mg  all other days.  Goal of Therapy:  INR 2-3 Monitor platelets by anticoagulation protocol: Yes   Plan:  No Coumadin today, already taken PT-INR daily Monitor for S/S of bleeding  Isac Sarna, BS [place], BCPS Clinical Pharmacist Pager [phone] 05/08/2018, 8:38 PM

## 2018-05-08 NOTE — ED Provider Notes (Signed)
[facility] EMERGENCY DEPARTMENT Provider Note   CSN: [id number] Arrival date & time: 05/08/18  1216     History   Chief Complaint Chief Complaint  Patient presents with  . Fall    HPI SHLOK RAZ is a 72 y.o. male.  Patient has a history of osteomyelitis with an abscess that he had drained recently and he is on antibiotics at home.  He was in rehab until 2 days ago when he was sent home.  The patient has had 3 falls since he is been at home and has contusions to both knees and lumbar spine  The history is provided by the patient. No language interpreter was used.  Fall  This is a new problem. The problem occurs constantly. The problem has not changed since onset.Pertinent negatives include no chest pain, no abdominal pain and no headaches. Nothing aggravates the symptoms. Nothing relieves the symptoms. He has tried nothing for the symptoms. The treatment provided no relief.    Past Medical History:  Diagnosis Date  . Allergic rhinitis   . Arthritis   . Asthma    as a child  . B12 deficiency   . Cancer [facility])    prostate  . Cervicogenic headache 01/28/2016  . Chronic back pain   . Coronary atherosclerosis of native coronary artery    Mild nonobstructive CAD at catheterization January 2015  . Depression   . Essential hypertension, benign   . Falls   . GERD (gastroesophageal reflux disease)   . History of cerebrovascular disease 07/23/2015  . History of pneumonia 02/2011  . Hyperlipidemia   . Kidney stone   . Memory difficulty 07/23/2015  . OSA (obstructive sleep apnea)    CPAP - Dr. [name]  . Prostate cancer ([place])   . PTSD (post-traumatic stress disorder)    [place]  . Rectal bleeding     Patient Active Problem List   Diagnosis Date Noted  . Falls frequently 05/08/2018  . Sleep disturbance   . Post-op pain   . Hypokalemia   . Morbid obesity ([place])   . Postoperative pain   . DVT, lower extremity, distal, acute, bilateral ([place])   . Acute blood loss anemia     . Anemia of chronic disease   . Essential hypertension   . Bacteremia   . Myelopathy ([place]) 03/30/2018  . Paraplegia ([place])   . Postlaminectomy syndrome, lumbar region   . Epidural abscess   . Abdominal distension   . Encephalopathy   . Weakness of both lower extremities   . Discitis of thoracic region   . Spinal cord stimulator status   . Staphylococcus aureus sepsis ([place]) 03/20/2018  . Bacteremia due to methicillin susceptible Staphylococcus aureus (MSSA) 03/18/2018  . Obesity 03/18/2018  . Weakness 03/16/2018  . Fever 03/16/2018  . Sepsis ([place]) 03/16/2018  . Nephrolithiasis 03/16/2018  . AKI (acute kidney injury) ([place]) 03/16/2018  . Acute metabolic encephalopathy 06/18/1600  . Cognitive impairment 03/16/2018  . Chronic pain syndrome 03/16/2018  . B12 deficiency 01/28/2016  . Memory difficulty 07/23/2015  . History of cerebrovascular disease 07/23/2015  . FH: colon cancer 08/13/2014  . Hx of adenomatous colonic polyps 08/13/2014  . Chronic back pain 05/30/2012  . CAD, NATIVE VESSEL 07/17/2010  . HYPERCHOLESTEROLEMIA 10/31/2009  . SMOKELESS TOBACCO ABUSE 10/31/2009  . Obstructive sleep apnea 10/31/2009  . Essential hypertension, benign 10/31/2009  . GERD 10/31/2009    Past Surgical History:  Procedure Laterality Date  . APPLICATION OF ROBOTIC ASSISTANCE FOR SPINAL  PROCEDURE N/A 03/28/2018   Procedure: APPLICATION OF ROBOTIC ASSISTANCE FOR SPINAL PROCEDURE;  Surgeon: Kristeen Miss, MD;  Location: [place];  Service: Neurosurgery;  Laterality: N/A;  . BACK SURGERY  02/14/12   lumbar OR #7; "today redid L1L2; replaced screws; added bone from hip"  . BILATERAL KNEE ARTHROSCOPY    . COLONOSCOPY  10/15/2008   Dr. [name]: tubular adenoma   . COLONOSCOPY  12/17/2003   OJJ:[id number] rectal and colon  . COLONOSCOPY N/A 09/05/2014   Procedure: COLONOSCOPY;  Surgeon: Daneil Dolin, MD;  [facility];  Service: Endoscopy;  Laterality: N/A;  7:30-rescheduled 9/17 to [place] notified pt  . CYSTOSCOPY WITH STENT PLACEMENT Right 01/27/2016   Procedure: CYSTOSCOPY WITH STENT PLACEMENT;  Surgeon: [name], MD;  Location: [facility] ORS;  Service: Urology;  Laterality: Right;  . CYSTOSCOPY/RETROGRADE/URETEROSCOPY/STONE EXTRACTION WITH BASKET Right 01/27/2016   Procedure: CYSTOSCOPY, RIGHT RETROGRADE, RIGHT URETEROSCOPY, STONE EXTRACTION ;  Surgeon: [name], MD;  Location: [facility];  Service: Urology;  Laterality: Right;  . ESOPHAGOGASTRODUODENOSCOPY  10/15/2008     Dr Rourk:Schatzki's ring status post dilation and disruption via 7 F Maloney dilator/ otherwise unremarkable esophagus, small hiatal hernia, multiple fundal gland polyps not manipulated, gastritis, negative H.pylori  . ESOPHAGOGASTRODUODENOSCOPY  06/21/02   WEX:HBZJI sliding hiatal hernia with mild changes of reflux esophagitis limited to gastroesophageal junction.  Noncritical ring at distal esophagus, 3 cm proximal to gastroesophageal junction/Antral gastritis  . [place]   "broke face playing softball"  . FRACTURE SURGERY     "left wrist; broke it; took spur off"  . HOLMIUM LASER APPLICATION Right 08/25/7892   Procedure: HOLMIUM LASER APPLICATION;  Surgeon: [name], MD;  Location: [facility] OR[facility];  Service: Urology;  Laterality: Right;  . LEFT HEART CATHETERIZATION WITH CORONARY ANGIOGRAM N/A 12/27/2013   Procedure: LEFT HEART CATHETERIZATION WITH CORONARY ANGIOGRAM;  Surgeon: [name], MD;  Location: [facility] CATH LAB;  Service: Cardiovascular;  Laterality: N/A;  . neck epidural    . POSTERIOR LUMBAR FUSION 4 LEVEL N/A 03/28/2018   Procedure: Thoracic eight -Lumbar two- FIXATION WITH SCREW PLACEMENT, DECOMPRESSION Thoracic ten-Thoracic eleven  FOR OSTEOMYELITIS;  Surgeon: Kristeen Miss, MD;  Location: [place];  Service: Neurosurgery;  Laterality: N/A;  . SHOULDER SURGERY Bilateral   . TEE WITHOUT CARDIOVERSION N/A 03/21/2018   Procedure: TRANSESOPHAGEAL ECHOCARDIOGRAM (TEE) WITH  PROPOFOL;  Surgeon: [name], MD;  [facility];  Service: Cardiovascular;  Laterality: N/A;        Home Medications    Prior to Admission medications   Medication Sig Start Date End Date Taking? Authorizing Provider  amLODipine (NORVASC) 10 MG tablet Take 1 tablet (10 mg total) by mouth daily. 05/05/18  Yes Angiulli, Lavon Paganini, PA-C  aspirin 81 MG tablet Take 81 mg by mouth daily.   Yes [provider]  Baclofen 5 MG TABS Take 5 mg by mouth 3 (three) times daily as needed for muscle spasms. 05/05/18  Yes Angiulli, Lavon Paganini, PA-C  ceFAZolin (ANCEF) IVPB Inject 2 g into the vein every 8 (eight) hours for 18 days. Indication:  MSSA Bacteremia and extensive epidural abscess discitis of thoracic spine Last Day of Therapy: 05/22/2018 Labs - Once weekly:  CBC/D and BMP, Labs - Every other week:  ESR and CRP 05/05/18 05/23/18 Yes Love, Ivan Anchors, PA-C  cycloSPORINE (RESTASIS) 0.05 % ophthalmic emulsion 1 DROP INTO BOTH EYES TWICE A DAY FOR DRY EYES 12/02/17 12/03/18 Yes  [provider]  docusate sodium (COLACE) 100 MG capsule Take 200 mg by mouth 2 (two) times daily.   Yes [provider]  donepezil (ARICEPT) 10 MG tablet Take 1 tablet (10 mg total) by mouth at bedtime. 05/05/18  Yes Angiulli, Lavon Paganini, PA-C  finasteride (PROSCAR) 5 MG tablet Take 1 tablet (5 mg total) by mouth daily. Reported on 05/04/2016 05/05/18  Yes Angiulli, Lavon Paganini, PA-C  fluticasone [facility]) 50 MCG/ACT nasal spray Place 1 spray into both nostrils daily as needed for allergies.    Yes [provider]  gabapentin (NEURONTIN) 300 MG capsule Take 2 capsules (600 mg total) by mouth 3 (three) times daily. 05/05/18  Yes Angiulli, Lavon Paganini, PA-C  latanoprost (XALATAN) 0.005 % ophthalmic solution Place 1 drop into both eyes at bedtime.  02/12/18  Yes [provider]  Melatonin 3 MG TABS Take 0.5 tablets (1.5 mg total) by mouth at bedtime. 05/05/18  Yes Angiulli, Lavon Paganini, PA-C    memantine (NAMENDA) 10 MG tablet Take 1 tablet (10 mg total) by mouth 2 (two) times daily. 05/05/18  Yes Angiulli, Lavon Paganini, PA-C  methocarbamol (ROBAXIN) 500 MG tablet Take 1 tablet (500 mg total) by mouth 4 (four) times daily. 05/05/18  Yes Angiulli, Lavon Paganini, PA-C  omeprazole (PRILOSEC) 20 MG capsule Take 2 capsules (40 mg total) by mouth daily. 05/05/18  Yes Angiulli, Lavon Paganini, PA-C  oxyCODONE 10 MG TABS Take 1 tablet (10 mg total) by mouth every 4 (four) hours as needed for moderate pain or severe pain. 05/05/18  Yes Angiulli, Lavon Paganini, PA-C  pravastatin (PRAVACHOL) 10 MG tablet Take 1 tablet (10 mg total) by mouth every evening. 05/05/18  Yes Angiulli, Lavon Paganini, PA-C  rifampin (RIFADIN) 300 MG capsule Continue through 05/22/2018 and stop Patient taking differently: Take 300 mg by mouth every 12 (twelve) hours. Continue through 05/22/2018 and stop 05/05/18  Yes Angiulli, Lavon Paganini, PA-C  traZODone (DESYREL) 100 MG tablet Take 1 tablet (100 mg total) by mouth at bedtime. 05/05/18  Yes Angiulli, Lavon Paganini, PA-C  venlafaxine XR (EFFEXOR-XR) 150 MG 24 hr capsule Take 1 capsule (150 mg total) by mouth 2 (two) times daily. 05/05/18  Yes Angiulli, Lavon Paganini, PA-C  vitamin B-12 (CYANOCOBALAMIN) 1000 MCG tablet Take 1 tablet (1,000 mcg total) by mouth daily. 05/05/18  Yes Angiulli, Lavon Paganini, PA-C  warfarin (COUMADIN) 5 MG tablet Take 1.5 tablets (7.9m) on Sunday, Tuesday, Wednesday, Thursday, and Saturday.  Take 2 tablets (122m on Monday and Friday. 05/06/18  Yes SwMeredith StaggersMD  lidocaine (LIDODERM) 5 % Place 1 patch onto the skin daily. Remove & Discard patch within 12 hours or as directed by MD Patient not taking: Reported on 05/08/2018 05/05/18   Angiulli, DaLavon PaganiniPA-C  linaclotide ([facility]290 MCG CAPS capsule Take 1 capsule (290 mcg total) by mouth daily before breakfast. 05/05/18   Angiulli, DaLavon PaganiniPA-C  lisinopril (PRINIVIL,ZESTRIL) 5 MG tablet Take 1 tablet (5 mg total) by mouth daily. 05/05/18    Angiulli, DaLavon PaganiniPA-C  potassium chloride SA (K-DUR,KLOR-CON) 20 MEQ tablet Take 1 tablet (20 mEq total) by mouth daily. 05/05/18   Angiulli, DaLavon PaganiniPA-C    Family History Family History  Problem Relation Age of Onset  . Emphysema Father   . Heart failure Father   . Lung cancer Father   . CAD Father   . Colon cancer Mother   . Stroke Mother   . Breast cancer Mother   .  Stroke Sister   . Heart attack Brother   . Dementia Paternal Uncle   . Emphysema Maternal Grandmother   . Stroke Maternal Grandmother   . Asthma Other        grandson  . Heart disease Paternal Grandfather   . Anesthesia problems Neg Hx   . Hypotension Neg Hx   . Malignant hyperthermia Neg Hx   . Pseudochol deficiency Neg Hx     Social History Social History   Tobacco Use  . Smoking status: Former Smoker    Types: Cigarettes    Last attempt to quit: 12/20/1958    Years since quitting: 59.4  . Smokeless tobacco: Current User    Types: Chew  . Tobacco comment: Quit chewing 04/2015  Substance Use Topics  . Alcohol use: Yes    Alcohol/week: 0.0 oz    Comment: couple bottles of wine per month  . Drug use: No    Types: Marijuana    Comment: "last used marijuana ~ 1969"     Allergies   Patient has no known allergies.   Review of Systems Review of Systems  Constitutional: Negative for appetite change and fatigue.  HENT: Negative for congestion, ear discharge and sinus pressure.   Eyes: Negative for discharge.  Respiratory: Negative for cough.   Cardiovascular: Negative for chest pain.  Gastrointestinal: Negative for abdominal pain and diarrhea.  Genitourinary: Negative for frequency and hematuria.  Musculoskeletal: Positive for back pain.  Skin: Negative for rash.  Neurological: Negative for seizures and headaches.  Psychiatric/Behavioral: Negative for hallucinations.     Physical Exam Updated Vital Signs BP 116/75   Pulse 98   Temp 98 F (36.7 C) (Oral)   Resp 18   Ht _0  (1.905  m)   Wt 124.7 kg (275 lb)   SpO2 95%   BMI 34.37 kg/m   Physical Exam  Constitutional: He is oriented to person, place, and time. He appears well-developed.  HENT:  Head: Normocephalic.  Eyes: Conjunctivae and EOM are normal. No scleral icterus.  Neck: Neck supple. No thyromegaly present.  Cardiovascular: Normal rate and regular rhythm. Exam reveals no gallop and no friction rub.  No murmur heard. Pulmonary/Chest: No stridor. He has no wheezes. He has no rales. He exhibits no tenderness.  Abdominal: He exhibits no distension. There is no tenderness. There is no rebound.  Musculoskeletal: He exhibits no edema.  Tenderness to lumbar spine and bilateral knee tenderness  Lymphadenopathy:    He has no cervical adenopathy.  Neurological: He is oriented to person, place, and time. He exhibits normal muscle tone. Coordination normal.  Skin: No rash noted. No erythema.  Psychiatric: He has a normal mood and affect. His behavior is normal.     ED Treatments / Results  Labs (all labs ordered are listed, but only abnormal results are displayed) Labs Reviewed  CBC WITH DIFFERENTIAL/PLATELET - Abnormal; Notable for the following components:      Result Value   WBC 12.0 (*)    RBC 4.18 (*)    Hemoglobin 10.0 (*)    HCT 34.5 (*)    MCH 23.9 (*)    MCHC 29.0 (*)    RDW 16.7 (*)    Platelets 579 (*)    Neutro Abs 8.7 (*)    Monocytes Absolute 1.6 (*)    All other components within normal limits  COMPREHENSIVE METABOLIC PANEL - Abnormal; Notable for the following components:   Sodium 134 (*)    Chloride 100 (*)  Glucose, Bld 116 (*)    Calcium 8.8 (*)    Albumin 2.7 (*)    AST 12 (*)    ALT 7 (*)    All other components within normal limits  PROTIME-INR - Abnormal; Notable for the following components:   Prothrombin Time 29.4 (*)    All other components within normal limits    EKG None  Radiology Dg Lumbar Spine 2-3 Views  Result Date: 05/08/2018 CLINICAL DATA:  Low back  pain after a fall. EXAM: LUMBAR SPINE - 2-3 VIEW COMPARISON:  Spot fluoro films from 03/28/2018. FINDINGS: Patient is status post extensive thoracolumbar fusion posteriorly with incomplete visualization of the thoracolumbar fusion hardware. Image portions of the hardware show no complicating features. Interbody cages are seen at L4-5 and L5-S1. Bones are diffusely demineralized. Although no definite compression deformity is evident, lateral film is limited in utility given the demineralization. IMPRESSION: No evidence for an acute fracture on this limited two-view study. Electronically Signed   By: Misty Stanley M.D.   On: 05/08/2018 16:58   Dg Knee 1-2 Views Left  Result Date: 05/08/2018 CLINICAL DATA:  [place] Saturday landing on bilateral knees. EXAM: RIGHT KNEE - 1-2 VIEW; LEFT KNEE - 1-2 VIEW COMPARISON:  MRI of the LEFT knee November 14, 2009 FINDINGS: LEFT: No acute fracture deformity or dislocation. Moderate to severe tricompartmental joint space narrowing with periarticular sclerosis and marginal spurring. Osteopenia without destructive bony lesions. Small suprapatellar joint effusion. Mild vascular calcifications. RIGHT: No acute fracture deformity dislocation. Moderate tricompartmental joint space narrowing with periarticular sclerosis and marginal spurring. No destructive bony lesions. Osteopenia. Soft tissue planes are nonsuspicious. IMPRESSION: 1. No acute fracture deformity dislocation. Small LEFT suprapatellar joint effusion. 2. Moderate RIGHT and moderate to severe LEFT tricompartmental osteoarthrosis. Electronically Signed   By: Elon Alas M.D.   On: 05/08/2018 16:55   Dg Knee 1-2 Views Right  Result Date: 05/08/2018 CLINICAL DATA:  [place] Saturday landing on bilateral knees. EXAM: RIGHT KNEE - 1-2 VIEW; LEFT KNEE - 1-2 VIEW COMPARISON:  MRI of the LEFT knee November 14, 2009 FINDINGS: LEFT: No acute fracture deformity or dislocation. Moderate to severe tricompartmental joint space  narrowing with periarticular sclerosis and marginal spurring. Osteopenia without destructive bony lesions. Small suprapatellar joint effusion. Mild vascular calcifications. RIGHT: No acute fracture deformity dislocation. Moderate tricompartmental joint space narrowing with periarticular sclerosis and marginal spurring. No destructive bony lesions. Osteopenia. Soft tissue planes are nonsuspicious. IMPRESSION: 1. No acute fracture deformity dislocation. Small LEFT suprapatellar joint effusion. 2. Moderate RIGHT and moderate to severe LEFT tricompartmental osteoarthrosis. Electronically Signed   By: Elon Alas M.D.   On: 05/08/2018 16:55   Dg Chest Portable 1 View  Result Date: 05/08/2018 CLINICAL DATA:  Verify PICC line placement. EXAM: PORTABLE CHEST 1 VIEW COMPARISON:  Apr 25, 2018 FINDINGS: A right PICC line is identified. The distal tip is difficult to visualize but appears to terminate in the central SVC. No change in the cardiomediastinal silhouette. No pneumothorax. No other acute interval changes. IMPRESSION: The right PICC line appears to be in good position. The distal tip is difficult to visualize with confidence but appears to be in the central SVC. Electronically Signed   By: Dorise Bullion III M.D   On: 05/08/2018 16:03   Dg Hip Unilat With Pelvis 2-3 Views Right  Result Date: 05/08/2018 CLINICAL DATA:  Fall, right hip pain EXAM: DG HIP (WITH OR WITHOUT PELVIS) 2-3V RIGHT COMPARISON:  None. FINDINGS: No fracture or dislocation is  seen. Bilateral hip joint spaces are preserved. Visualized bony pelvis appears intact. Degenerative changes at L5-S1. IMPRESSION: Negative. Electronically Signed   By: Julian Hy M.D.   On: 05/08/2018 16:53    Procedures Procedures (including critical care time)  Medications Ordered in ED Medications  0.9 %  sodium chloride infusion ( Intravenous New Bag/Given 05/08/18 1942)  oxyCODONE-acetaminophen (PERCOCET/ROXICET) 5-325 MG per tablet 2 tablet (2  tablets Oral Given 05/08/18 1525)  ceFAZolin (ANCEF) 3 g in dextrose 5 % 50 mL IVPB (0 g Intravenous Stopped 05/08/18 1726)  heparin lock flush 100 unit/mL (500 Units Intravenous Given 05/08/18 1739)     Initial Impression / Assessment and Plan / ED Course  I have reviewed the triage vital signs and the nursing notes.  Pertinent labs & imaging results that were available during my care of the patient were reviewed by me and considered in my medical decision making (see chart for details).     Labs unremarkable including CBC chemistry and pro time.  X-rays do not show any fractures.  Patient will be admitted to medicine for frequent falls and possible placement back and either rehab or nursing home  Final Clinical Impressions(s) / ED Diagnoses   Final diagnoses:  Fall, initial encounter    ED Discharge Orders    None       Milton Ferguson, MD 05/08/18 2030

## 2018-05-08 NOTE — Progress Notes (Signed)
Social Work Discharge Note  The overall goal for the admission was met for:   Discharge location: Yes - home with girlfriend and his daughters providing 24/7 assistance  Length of Stay: Yes - 37 days  Discharge activity level: Yes - minimal assistance w/c level  Home/community participation: Yes  Services provided included: MD, RD, PT, OT, RN, TR, Pharmacy, [place]: [employer] and Private Insurance: Feb [employer]  Follow-up services arranged: Home Health: RN, PT, OT via [place], DME: wheelchair/ cushion, wide drop arm commode, tub bench and 30" transfer board via [place] and Patient/Family has no preference for HH/DME agencies  Comments (or additional information):  Patient/Family verbalized understanding of follow-up arrangements: Yes  Individual responsible for coordination of the follow-up plan: pt  Confirmed correct DME delivered: Kadisha Goodine 05/08/2018    Kaelynne Christley

## 2018-05-09 DIAGNOSIS — R296 Repeated falls: Secondary | ICD-10-CM | POA: Diagnosis not present

## 2018-05-09 LAB — MAGNESIUM: Magnesium: 1.7 mg/dL (ref 1.7–2.4)

## 2018-05-09 LAB — TSH: TSH: 0.867 u[IU]/mL (ref 0.350–4.500)

## 2018-05-09 LAB — PROTIME-INR
INR: 2.93
Prothrombin Time: 30.3 seconds — ABNORMAL HIGH (ref 11.4–15.2)

## 2018-05-09 MED ORDER — KETOROLAC TROMETHAMINE 15 MG/ML IJ SOLN
15.0000 mg | Freq: Three times a day (TID) | INTRAMUSCULAR | Status: AC | PRN
  Administered 2018-05-09 – 2018-05-14 (×9): 15 mg via INTRAVENOUS
  Filled 2018-05-09 (×9): qty 1

## 2018-05-09 MED ORDER — WARFARIN SODIUM 5 MG PO TABS
5.0000 mg | ORAL_TABLET | Freq: Once | ORAL | Status: AC
  Administered 2018-05-09: 5 mg via ORAL
  Filled 2018-05-09: qty 1

## 2018-05-09 MED ORDER — WARFARIN - PHARMACIST DOSING INPATIENT
Freq: Every day | Status: DC
  Administered 2018-05-10 – 2018-05-21 (×6)

## 2018-05-09 NOTE — Progress Notes (Signed)
ANTICOAGULATION CONSULT NOTE - initial Consult  Pharmacy Consult for warfarin Indication: DVT  No Known Allergies  Patient Measurements: Height: 6\' 3"  (190.5 cm) Weight: 275 lb (124.7 kg) IBW/kg (Calculated) : 84.5   Vital Signs: Temp: 98.3 F (36.8 C) (05/21 0451) Temp Source: Oral (05/21 0451) BP: 137/85 (05/21 0451) Pulse Rate: 90 (05/21 0451)  Labs: Recent Labs    05/08/18 1839 05/09/18 0633  HGB 10.0*  --   HCT 34.5*  --   PLT 579*  --   LABPROT 29.4* 30.3*  INR 2.82 2.93  CREATININE 0.79  --     Estimated Creatinine Clearance: 120.5 mL/min (by C-G formula based on SCr of 0.79 mg/dL).   Assessment: 85 YOM  Recently discharged from [facility] with MSSA bacteremia currently being treated with cefazolin and rifampin through 05/22/18 (per ID recs). DVT noted on doppler 4/12, decision to anticoagulate is complicated by rifampin drug interaction with all oral anticoagulants. INR is therapeutic at 2.93 Home regimen is warfarin 10mg  on Mondays and Fridays, 7.5mg  all other days.  Goal of Therapy:  INR 2-3 Monitor platelets by anticoagulation protocol: Yes   Plan:  Warfarin 5 mg x 1 dose PT-INR daily Monitor for S/S of bleeding  Margot Ables, PharmD Clinical Pharmacist 05/09/2018 8:39 AM

## 2018-05-09 NOTE — Care Management Note (Signed)
Case Management Note  Patient Details  Name: Cody Velez MRN: [id number] Date of Birth: 03/10/1946  Subjective/Objective:  Adm with falls. From home. Recent stay at [facility]. DC's on Saturday. Reports home health ordered with [facility]. Nursing has been out, no PT sessions yet. DME: has WC, tub bench, transfer board, BSC.  Family interested in CIR again if possible.  PT has seen patient. OT eval pending.  Daughter, Cody Velez reports she has talked with Education officer, museum at [employer]  Inpatient rehab consulted to assess patient for eligibility.                 Action/Plan: CM following for needs. CSW consulted with discuss SNF options with family for backup plan.   Expected Discharge Date:  05/10/18               Expected Discharge Plan:     In-House Referral:  Clinical Social Work  Discharge planning Services  CM Consult  Post Acute Care Choice:    Choice offered to:     DME Arranged:    DME Agency:     HH Arranged:    HH Agency:     Status of Service:  In process, will continue to follow  If discussed at Long Length of Stay Meetings, dates discussed:    Additional Comments:  Cody Velez, Chauncey [place], RN 05/09/2018, 12:11 PM

## 2018-05-09 NOTE — Evaluation (Signed)
Physical Therapy Evaluation Patient Details Name: Cody Velez MRN: [id number] DOB: 09/25/1946 Today's Date: 05/09/2018   History of Present Illness  Cody Velez is a 72 y.o. male with medical history significant of obstructive sleep apnea, hypertension, obesity, chronic back pain with multiple 3 previous surgeries who recently had an epidural abscess causing paraplegia surgically decompressed on March 28, 2018 has been on 8 weeks duration of cefazolin and rifampin until May 22, 2018.  Patient has been in short-term rehab and was just discharged this past Saturday.  He went home and since being home he is fallen several times.  Family is being him back in due to his frequent falls and the need for further rehab versus skilled nursing facility placement.  They are unaware why he was discharged from the rehab center on Saturday but suspect is because he ran out of insurance days but they are not sure.  They report that he is barely able to take a couple of steps without 2-3  people helping him with a walker.  When he had his injury initially he could not move his legs at all.  So he had had a lot of improvement with being able to move his legs and get to the point where he can take a couple steps.  But is not in any shape at this point to be able to get up and around at home.  Patient is being referred for admission for again further evaluation for more rehabilitation.    Clinical Impression  Patient required Max assist to don/doff left knee support and TLSO brace, unable to stand using RW due to BLE weakness and fear of falling, demonstrates fair/good return for laterally scooting over to w/c using sliding and tolerated staying up in w/c for approximately 2 hours before requesting to go back to bed.  Patient will benefit from continued physical therapy in hospital and recommended venue below to increase strength, balance, endurance for safe ADLs and gait.    Follow Up Recommendations  CIR;Supervision for mobility/OOB    Equipment Recommendations  None recommended by PT    Recommendations for Other Services       Precautions / Restrictions Precautions Precautions: Fall;Back Precaution Booklet Issued: No Precaution Comments: Back precautions Required Braces or Orthoses: Spinal Brace Knee Immobilizer - Right: On when out of bed or walking Spinal Brace: Thoracolumbosacral orthotic;Applied in sitting position Restrictions Weight Bearing Restrictions: No Other Position/Activity Restrictions: sit upright in chair for spinal protection      Mobility  Bed Mobility Overal bed mobility: Needs Assistance Bed Mobility: Sit to Supine;Supine to Sit;Rolling Rolling: Min assist;Mod assist   Supine to sit: Mod assist Sit to supine: Mod assist   General bed mobility comments: slow labored movement, need help to move BLE when rolling side to side  Transfers Overall transfer level: Needs assistance Equipment used: Sliding board Transfers: Lateral/Scoot Transfers          Lateral/Scoot Transfers: Mod assist General transfer comment: unable to sit to stand due to BLE weakness/apprehension, required  use of sliding to for bed<>w/c transfers  Ambulation/Gait                Stairs            Wheelchair Mobility    Modified Rankin (Stroke Patients Only)       Balance Overall balance assessment: Needs assistance Sitting-balance support: Feet supported;No upper extremity supported Sitting balance-Leahy Scale: Fair Sitting balance - Comments: fair/good  Pertinent Vitals/Pain Pain Assessment: 0-10 Pain Score: 9  Pain Location: low back, left shoulder, right hip, bilateral knees, left knee worse than right Pain Descriptors / Indicators: Aching;Sharp;Discomfort;Grimacing;Guarding Pain Intervention(s): Limited activity within patient's tolerance;Monitored during session;Premedicated before session     [place] expects to be discharged to:: Private residence Living Arrangements: Spouse/significant other Available Help at Discharge: Friend(s);Available 24 hours/day(girl friend, family) Type of Home: House Home Access: Ramped entrance     Home Layout: Able to live on main level with bedroom/bathroom;Multi-level Home Equipment: Walker - 2 wheels;Shower seat;Bedside commode;Cane - single point      Prior Function Level of Independence: Needs assistance   Gait / Transfers Assistance Needed: limited to a few steps using RW with assistance since back surgery  ADL's / Homemaking Assistance Needed: assisted by family        Hand Dominance        Extremity/Trunk Assessment   Upper Extremity Assessment Upper Extremity Assessment: Generalized weakness    Lower Extremity Assessment Lower Extremity Assessment: Generalized weakness;RLE deficits/detail;LLE deficits/detail RLE Deficits / Details: grossly 2+/5 LLE Deficits / Details: grossly 2+/5    Cervical / Trunk Assessment Cervical / Trunk Assessment: Normal  Communication   Communication: No difficulties  Cognition Arousal/Alertness: Awake/alert Behavior During Therapy: WFL for tasks assessed/performed Overall Cognitive Status: Within Functional Limits for tasks assessed                                        General Comments      Exercises     Assessment/Plan    PT Assessment Patient needs continued PT services  PT Problem List Decreased strength;Decreased activity tolerance;Decreased balance;Decreased mobility       PT Treatment Interventions Gait training;Functional mobility training;Therapeutic activities;Therapeutic exercise;Patient/family education;Wheelchair mobility training    PT Goals (Current goals can be found in the Care Plan section)  Acute Rehab PT Goals Patient Stated Goal: return home after rehab PT Goal Formulation: With patient/family Time For Goal  Achievement: 05/26/18 Potential to Achieve Goals: Good    Frequency Min 4X/week   Barriers to discharge        Co-evaluation               AM-PAC PT "6 Clicks" Daily Activity  Outcome Measure Difficulty turning over in bed (including adjusting bedclothes, sheets and blankets)?: A Lot Difficulty moving from lying on back to sitting on the side of the bed? : A Lot Difficulty sitting down on and standing up from a chair with arms (e.g., wheelchair, bedside commode, etc,.)?: Unable Help needed moving to and from a bed to chair (including a wheelchair)?: Total Help needed walking in hospital room?: Total Help needed climbing 3-5 steps with a railing? : Total 6 Click Score: 8    End of Session Equipment Utilized During Treatment: Gait belt Activity Tolerance: Patient tolerated treatment well;Patient limited by fatigue Patient left: in chair;with call bell/phone within reach;with family/visitor present Nurse Communication: Mobility status PT Visit Diagnosis: Unsteadiness on feet (R26.81);Other abnormalities of gait and mobility (R26.89);Muscle weakness (generalized) (M62.81)    Time: 6213-0865 PT Time Calculation (min) (ACUTE ONLY): 43 min   Charges:   PT Evaluation $PT Eval Moderate Complexity: 1 Mod PT Treatments $Therapeutic Activity: 38-52 mins $ PT Supplies: 1 Supply   PT G Codes:        12:30 PM, May 26, 2018 Lonell Grandchild, MPT Physical Therapist with [facility]  [facility] [phone] office [phone] mobile phone

## 2018-05-09 NOTE — Progress Notes (Addendum)
PROGRESS NOTE    Cody Velez  IEP:[id number] DOB: [birth year] DOA: 05/08/2018 PCP: Asencion Noble, MD     Brief Narrative:  72 year old man admitted from home on 5/20 due to frequent falls.  He was just discharged on Saturday from: Inpatient rehab after 37-day stay following an epidural abscess that was surgically decompressed because paraplegia.  Plan has been for an 8-week course of Ancef and rifampin that is scheduled to be complete on June 3.  Since home he has been having frequent falls and the family brings him in for placement issues.   Assessment & Plan:   Principal Problem:   Falls frequently Active Problems:   Memory difficulty   Chronic pain syndrome   Obesity   Epidural abscess   Paraplegia (HCC)   Frequent falls -With generalized weakness and deconditioning following paraplegia induced by an epidural abscess. -Completed a 37-day at [facility] inpatient rehab. -Seen by PT today who recommends CIR versus SNF. -It appears that CIR will not be able to offer him further placement, social work is informed of need for SNF. -We will also check TSH and B12.  Epidural abscess/MSSA bacteremia -Continue IV Ancef and oral rifampin for planned course until June 3.   DVT prophylaxis: Coumadin, dosed by pharmacy Code Status: Full code Family Communication: Discussed with significant other at bedside Disposition Plan: Likely SNF  Consultants:   None   Procedures:   None  Antimicrobials:  Anti-infectives (From admission, onward)   Start     Dose/Rate Route Frequency Ordered Stop   05/09/18 0100  ceFAZolin (ANCEF) IVPB 2g/100 mL premix     2 g 200 mL/hr over 30 Minutes Intravenous Every 8 hours 05/08/18 2213 05/22/18 1659   05/08/18 2200  rifampin (RIFADIN) capsule 300 mg    Note to Pharmacy:  Continue through 05/22/2018 and stop     300 mg Oral Every 12 hours 05/08/18 2032 05/22/18 2159   05/08/18 2200  ceFAZolin (ANCEF) IVPB  Status:  Discontinued    Note to Pharmacy:   Indication:  MSSA Bacteremia and extensive epidural abscess discitis of thoracic spine Last Day of Therapy: 05/22/2018 Labs - Once weekly:  CBC/D and BMP, Labs - Every other week:  ESR and CRP     2 g Intravenous Every 8 hours 05/08/18 2032 05/08/18 2211   05/08/18 1530  ceFAZolin (ANCEF) 3 g in dextrose 5 % 50 mL IVPB     3 g 100 mL/hr over 30 Minutes Intravenous  Once 05/08/18 1521 05/08/18 1726       Subjective: Lying in bed, still feels very weak  Objective: Vitals:   05/08/18 2030 05/08/18 2202 05/09/18 0451 05/09/18 1353  BP: (!) 129/91 140/83 137/85 128/82  Pulse: 95 92 90 91  Resp:  _0 Temp:  98.2 F (36.8 C) 98.3 F (36.8 C) 97.8 F (36.6 C)  TempSrc:  Oral Oral Oral  SpO2: 96% 95% 96% 99%  Weight:      Height:        Intake/Output Summary (Last 24 hours) at 05/09/2018 1817 Last data filed at 05/09/2018 1451 Gross per 24 hour  Intake 1890.42 ml  Output 900 ml  Net 990.42 ml   Filed Weights   05/08/18 1221  Weight: 124.7 kg (275 lb)    Examination:  General exam: Alert, awake, oriented x 3 Respiratory system: Clear to auscultation. Respiratory effort normal. Cardiovascular system:RRR. No murmurs, rubs, gallops. Gastrointestinal system: Abdomen is nondistended, soft and nontender. No organomegaly or  masses felt. Normal bowel sounds heard. Central nervous system: Alert and oriented.  Significant weakness of his lower extremities, paraplegia Extremities: No C/C/E, +pedal pulses Skin: No rashes, lesions or ulcers Psychiatry: Judgement and insight appear normal. Mood & affect appropriate.     Data Reviewed: I have personally reviewed following labs and imaging studies  CBC: Recent Labs  Lab 05/03/18 0454 05/06/18 0411 05/08/18 1839  WBC 6.9 6.6 12.0*  NEUTROABS  --   --  8.7*  HGB 9.7* 9.4* 10.0*  HCT 33.4* 33.1* 34.5*  MCV 84.3 83.6 82.5  PLT 591* 588* 237*   Basic Metabolic Panel: Recent Labs  Lab 05/08/18 1839 05/09/18 0633  NA 134*   --   K 4.0  --   CL 100*  --   CO2 26  --   GLUCOSE 116*  --   BUN 10  --   CREATININE 0.79  --   CALCIUM 8.8*  --   MG  --  1.7   GFR: Estimated Creatinine Clearance: 120.5 mL/min (by C-G formula based on SCr of 0.79 mg/dL). Liver Function Tests: Recent Labs  Lab 05/08/18 1839  AST 12*  ALT 7*  ALKPHOS 99  BILITOT 0.5  PROT 7.5  ALBUMIN 2.7*   No results for input(s): LIPASE, AMYLASE in the last 168 hours. No results for input(s): AMMONIA in the last 168 hours. Coagulation Profile: Recent Labs  Lab 05/04/18 0500 05/05/18 0355 05/06/18 0411 05/08/18 1839 05/09/18 0633  INR 2.35 2.37 2.18 2.82 2.93   Cardiac Enzymes: No results for input(s): CKTOTAL, CKMB, CKMBINDEX, TROPONINI in the last 168 hours. BNP (last 3 results) No results for input(s): PROBNP in the last 8760 hours. HbA1C: No results for input(s): HGBA1C in the last 72 hours. CBG: No results for input(s): GLUCAP in the last 168 hours. Lipid Profile: No results for input(s): CHOL, HDL, LDLCALC, TRIG, CHOLHDL, LDLDIRECT in the last 72 hours. Thyroid Function Tests: No results for input(s): TSH, T4TOTAL, FREET4, T3FREE, THYROIDAB in the last 72 hours. Anemia Panel: No results for input(s): VITAMINB12, FOLATE, FERRITIN, TIBC, IRON, RETICCTPCT in the last 72 hours. Urine analysis:    Component Value Date/Time   COLORURINE AMBER (A) 04/05/2018 1621   APPEARANCEUR CLEAR 04/05/2018 1621   LABSPEC 1.029 04/05/2018 1621   PHURINE 5.0 04/05/2018 1621   GLUCOSEU NEGATIVE 04/05/2018 1621   HGBUR NEGATIVE 04/05/2018 1621   BILIRUBINUR MODERATE (A) 04/05/2018 1621   KETONESUR NEGATIVE 04/05/2018 1621   PROTEINUR 30 (A) 04/05/2018 1621   UROBILINOGEN 0.2 03/05/2011 1909   NITRITE NEGATIVE 04/05/2018 1621   LEUKOCYTESUR NEGATIVE 04/05/2018 1621   Sepsis Labs: _0 (procalcitonin:4,lacticidven:4)  )No results found for this or any previous visit (from the past 240 hour(s)).       Radiology  Studies: Dg Lumbar Spine 2-3 Views  Result Date: 05/08/2018 CLINICAL DATA:  Low back pain after a fall. EXAM: LUMBAR SPINE - 2-3 VIEW COMPARISON:  Spot fluoro films from 03/28/2018. FINDINGS: Patient is status post extensive thoracolumbar fusion posteriorly with incomplete visualization of the thoracolumbar fusion hardware. Image portions of the hardware show no complicating features. Interbody cages are seen at L4-5 and L5-S1. Bones are diffusely demineralized. Although no definite compression deformity is evident, lateral film is limited in utility given the demineralization. IMPRESSION: No evidence for an acute fracture on this limited two-view study. Electronically Signed   By: Misty Stanley M.D.   On: 05/08/2018 16:58   Dg Knee 1-2 Views Left  Result Date: 05/08/2018 CLINICAL DATA:  Fell Saturday landing on bilateral knees. EXAM: RIGHT KNEE - 1-2 VIEW; LEFT KNEE - 1-2 VIEW COMPARISON:  MRI of the LEFT knee November 14, 2009 FINDINGS: LEFT: No acute fracture deformity or dislocation. Moderate to severe tricompartmental joint space narrowing with periarticular sclerosis and marginal spurring. Osteopenia without destructive bony lesions. Small suprapatellar joint effusion. Mild vascular calcifications. RIGHT: No acute fracture deformity dislocation. Moderate tricompartmental joint space narrowing with periarticular sclerosis and marginal spurring. No destructive bony lesions. Osteopenia. Soft tissue planes are nonsuspicious. IMPRESSION: 1. No acute fracture deformity dislocation. Small LEFT suprapatellar joint effusion. 2. Moderate RIGHT and moderate to severe LEFT tricompartmental osteoarthrosis. Electronically Signed   By: Elon Alas M.D.   On: 05/08/2018 16:55   Dg Knee 1-2 Views Right  Result Date: 05/08/2018 CLINICAL DATA:  [place] Saturday landing on bilateral knees. EXAM: RIGHT KNEE - 1-2 VIEW; LEFT KNEE - 1-2 VIEW COMPARISON:  MRI of the LEFT knee November 14, 2009 FINDINGS: LEFT: No acute  fracture deformity or dislocation. Moderate to severe tricompartmental joint space narrowing with periarticular sclerosis and marginal spurring. Osteopenia without destructive bony lesions. Small suprapatellar joint effusion. Mild vascular calcifications. RIGHT: No acute fracture deformity dislocation. Moderate tricompartmental joint space narrowing with periarticular sclerosis and marginal spurring. No destructive bony lesions. Osteopenia. Soft tissue planes are nonsuspicious. IMPRESSION: 1. No acute fracture deformity dislocation. Small LEFT suprapatellar joint effusion. 2. Moderate RIGHT and moderate to severe LEFT tricompartmental osteoarthrosis. Electronically Signed   By: Elon Alas M.D.   On: 05/08/2018 16:55   Dg Chest Portable 1 View  Result Date: 05/08/2018 CLINICAL DATA:  Verify PICC line placement. EXAM: PORTABLE CHEST 1 VIEW COMPARISON:  Apr 25, 2018 FINDINGS: A right PICC line is identified. The distal tip is difficult to visualize but appears to terminate in the central SVC. No change in the cardiomediastinal silhouette. No pneumothorax. No other acute interval changes. IMPRESSION: The right PICC line appears to be in good position. The distal tip is difficult to visualize with confidence but appears to be in the central SVC. Electronically Signed   By: Dorise Bullion III M.D   On: 05/08/2018 16:03   Dg Hip Unilat With Pelvis 2-3 Views Right  Result Date: 05/08/2018 CLINICAL DATA:  Fall, right hip pain EXAM: DG HIP (WITH OR WITHOUT PELVIS) 2-3V RIGHT COMPARISON:  None. FINDINGS: No fracture or dislocation is seen. Bilateral hip joint spaces are preserved. Visualized bony pelvis appears intact. Degenerative changes at L5-S1. IMPRESSION: Negative. Electronically Signed   By: Julian Hy M.D.   On: 05/08/2018 16:53        Scheduled Meds: . amLODipine  10 mg Oral Daily  . aspirin  81 mg Oral Daily  . docusate sodium  200 mg Oral BID  . donepezil  10 mg Oral QHS  .  finasteride  5 mg Oral Daily  . gabapentin  600 mg Oral TID  . latanoprost  1 drop Both Eyes QHS  . linaclotide  290 mcg Oral QAC breakfast  . lisinopril  5 mg Oral Daily  . Melatonin  1.5 mg Oral QHS  . memantine  10 mg Oral BID  . methocarbamol  500 mg Oral QID  . potassium chloride SA  20 mEq Oral Daily  . pravastatin  10 mg Oral QPM  . rifampin  300 mg Oral Q12H  . sodium chloride flush  3 mL Intravenous Q12H  . traZODone  100 mg Oral QHS  . venlafaxine XR  150 mg Oral BID  .  vitamin B-12  1,000 mcg Oral Daily  . Warfarin - Pharmacist Dosing Inpatient   Does not apply q1800   Continuous Infusions: . sodium chloride    .  ceFAZolin (ANCEF) IV 2 g (05/09/18 1814)     LOS: 0 days    Time spent: 25 minutes.     Lelon Frohlich, MD [facility] Pager [phone]  If 7PM-7AM, please contact night-coverage [email or web address] Password [facility] 05/09/2018, 6:17 PM

## 2018-05-09 NOTE — Plan of Care (Signed)
  Problem: Acute Rehab PT Goals(only PT should resolve) Goal: Pt Will Go Supine/Side To Sit Outcome: Progressing Flowsheets (Taken 05/09/2018 1232) Pt will go Supine/Side to Sit: with minimal assist Goal: Patient Will Transfer Sit To/From Stand Outcome: Progressing Flowsheets (Taken 05/09/2018 1232) Patient will transfer sit to/from stand: with moderate assist;with maximum assist Goal: Pt Will Transfer Bed To Chair/Chair To Bed Outcome: Progressing Flowsheets (Taken 05/09/2018 1232) Pt will Transfer Bed to Chair/Chair to Bed: with mod assist;with max assist Goal: Pt Will Ambulate Outcome: Progressing Flowsheets (Taken 05/09/2018 1232) Pt will Ambulate: 10 feet;with maximum assist;with rolling walker  12:34 PM, 05/09/18 Lonell Grandchild, MPT Physical Therapist with [facility] [phone] office [phone] mobile phone

## 2018-05-09 NOTE — Plan of Care (Signed)
  Problem: Acute Rehab OT Goals (only OT should resolve) Goal: Pt. Will Perform Grooming Flowsheets (Taken 05/09/2018 1702) Pt Will Perform Grooming: with set-up;sitting Goal: Pt. Will Perform Upper Body Bathing Flowsheets (Taken 05/09/2018 1702) Pt Will Perform Upper Body Bathing: with set-up;with min assist;sitting Goal: Pt. Will Perform Upper Body Dressing Flowsheets (Taken 05/09/2018 1702) Pt Will Perform Upper Body Dressing: with set-up;sitting Goal: Pt. Will Transfer To Toilet Flowsheets (Taken 05/09/2018 1702) Pt Will Transfer to Toilet: with min assist;with transfer board;bedside commode Goal: Pt. Will Perform Toileting-Clothing Manipulation Flowsheets (Taken 05/09/2018 1702) Pt Will Perform Toileting - Clothing Manipulation and hygiene: with min assist;sitting/lateral leans Goal: Pt/Caregiver Will Perform Home Exercise Program Flowsheets (Taken 05/09/2018 1702) Pt/caregiver will Perform Home Exercise Program: Increased strength;Both right and left upper extremity;With written HEP provided

## 2018-05-09 NOTE — NC FL2 (Signed)
[place] LEVEL OF CARE SCREENING TOOL     IDENTIFICATION  Patient Name: Cody Velez Birthdate: 02/22/1946 Sex: male Admission Date (Current Location): 05/08/2018  [facility] and [place] Number:  [employer] and Address:  [facility] [address] [place]      Provider Number: [id number]  Attending Physician Name and Address:  Isaac Bliss, Olam Idler*  Relative Name and Phone Number:       Current Level of Care: Other (Comment)(obs.) Recommended Level of Care: [place] Prior Approval Number:    Date Approved/Denied:   PASRR Number: [id number]  Discharge Plan: SNF    Current Diagnoses: Patient Active Problem List   Diagnosis Date Noted  . Falls frequently 05/08/2018  . Fall   . Sleep disturbance   . Post-op pain   . Hypokalemia   . Morbid obesity ([place])   . Postoperative pain   . DVT, lower extremity, distal, acute, bilateral ([place])   . Acute blood loss anemia   . Anemia of chronic disease   . Essential hypertension   . Bacteremia   . Myelopathy ([place]) 03/30/2018  . Paraplegia ([place])   . Postlaminectomy syndrome, lumbar region   . Epidural abscess   . Abdominal distension   . Encephalopathy   . Weakness of both lower extremities   . Discitis of thoracic region   . Spinal cord stimulator status   . Staphylococcus aureus sepsis ([place]) 03/20/2018  . Bacteremia due to methicillin susceptible Staphylococcus aureus (MSSA) 03/18/2018  . Obesity 03/18/2018  . Weakness 03/16/2018  . Fever 03/16/2018  . Sepsis ([place]) 03/16/2018  . Nephrolithiasis 03/16/2018  . AKI (acute kidney injury) ([place]) 03/16/2018  . Acute metabolic encephalopathy 70/96/2836  . Cognitive impairment 03/16/2018  . Chronic pain syndrome 03/16/2018  . B12 deficiency 01/28/2016  . Memory difficulty 07/23/2015  . History of cerebrovascular disease 07/23/2015  . FH: colon cancer 08/13/2014  . Hx of adenomatous colonic polyps  08/13/2014  . Chronic back pain 05/30/2012  . CAD, NATIVE VESSEL 07/17/2010  . HYPERCHOLESTEROLEMIA 10/31/2009  . SMOKELESS TOBACCO ABUSE 10/31/2009  . Obstructive sleep apnea 10/31/2009  . Essential hypertension, benign 10/31/2009  . GERD 10/31/2009    Orientation RESPIRATION BLADDER Height & Weight     Self, Time, Situation, Place  Normal Continent Weight: 275 lb (124.7 kg) Height:  6\' 3"  (190.5 cm)  BEHAVIORAL SYMPTOMS/MOOD NEUROLOGICAL BOWEL NUTRITION STATUS      Continent Diet(heart healthy)  AMBULATORY STATUS COMMUNICATION OF NEEDS Skin   Extensive Assist(wheelchair and sliding board.) Verbally Normal                       Personal Care Assistance Level of Assistance  Bathing, Feeding, Dressing Bathing Assistance: Limited assistance Feeding assistance: Independent Dressing Assistance: Limited assistance     Functional Limitations Info  Sight, Hearing, Speech Sight Info: Adequate Hearing Info: Adequate      SPECIAL CARE FACTORS FREQUENCY  PT (By licensed PT)     PT Frequency: 5x/week              Contractures Contractures Info: Not present    Additional Factors Info  Code Status, Allergies, Psychotropic Code Status Info: Full Code Allergies Info: NKA Psychotropic Info: Desyrel, Effecor XR         Current Medications (05/09/2018):  This is the current hospital active medication list Current Facility-Administered Medications  Medication Dose Route Frequency Provider Last Rate Last Dose  .  0.9 %  sodium chloride infusion  250 mL Intravenous PRN Derrill Kay A, MD      . amLODipine (NORVASC) tablet 10 mg  10 mg Oral Daily Phillips Grout, MD   10 mg at 05/09/18 0918  . aspirin chewable tablet 81 mg  81 mg Oral Daily Derrill Kay A, MD   81 mg at 05/09/18 0912  . ceFAZolin (ANCEF) IVPB 2g/100 mL premix  2 g Intravenous Q8H Phillips Grout, MD   Stopped at 05/09/18 1126  . docusate sodium (COLACE) capsule 200 mg  200 mg Oral BID Derrill Kay A, MD    200 mg at 05/09/18 0912  . donepezil (ARICEPT) tablet 10 mg  10 mg Oral QHS Derrill Kay A, MD   10 mg at 05/08/18 2326  . finasteride (PROSCAR) tablet 5 mg  5 mg Oral Daily Derrill Kay A, MD   5 mg at 05/09/18 1046  . fluticasone (FLONASE) 50 MCG/ACT nasal spray 1 spray  1 spray Each Nare Daily PRN Derrill Kay A, MD      . gabapentin (NEURONTIN) capsule 600 mg  600 mg Oral TID Phillips Grout, MD   600 mg at 05/09/18 1547  . latanoprost (XALATAN) 0.005 % ophthalmic solution 1 drop  1 drop Both Eyes QHS Phillips Grout, MD   1 drop at 05/09/18 0003  . linaclotide (LINZESS) capsule 290 mcg  290 mcg Oral QAC breakfast Phillips Grout, MD   290 mcg at 05/09/18 0913  . lisinopril (PRINIVIL,ZESTRIL) tablet 5 mg  5 mg Oral Daily Derrill Kay A, MD   5 mg at 05/09/18 0912  . Melatonin TABS 1.5 mg  1.5 mg Oral QHS Derrill Kay A, MD   1.5 mg at 05/09/18 0004  . memantine (NAMENDA) tablet 10 mg  10 mg Oral BID Phillips Grout, MD   10 mg at 05/09/18 0912  . methocarbamol (ROBAXIN) tablet 500 mg  500 mg Oral QID Derrill Kay A, MD   500 mg at 05/09/18 1405  . oxyCODONE (Oxy IR/ROXICODONE) immediate release tablet 10 mg  10 mg Oral Q4H PRN Phillips Grout, MD   10 mg at 05/09/18 1608  . potassium chloride SA (K-DUR,KLOR-CON) CR tablet 20 mEq  20 mEq Oral Daily Derrill Kay A, MD   20 mEq at 05/09/18 0912  . pravastatin (PRAVACHOL) tablet 10 mg  10 mg Oral QPM Phillips Grout, MD   10 mg at 05/08/18 2348  . rifampin (RIFADIN) capsule 300 mg  300 mg Oral Q12H Derrill Kay A, MD   300 mg at 05/09/18 1045  . sodium chloride flush (NS) 0.9 % injection 3 mL  3 mL Intravenous Q12H Derrill Kay A, MD   3 mL at 05/09/18 1119  . sodium chloride flush (NS) 0.9 % injection 3 mL  3 mL Intravenous PRN Phillips Grout, MD      . traZODone (DESYREL) tablet 100 mg  100 mg Oral QHS Derrill Kay A, MD   100 mg at 05/08/18 2324  . venlafaxine XR (EFFEXOR-XR) 24 hr capsule 150 mg  150 mg Oral BID Derrill Kay A, MD    150 mg at 05/09/18 0912  . vitamin B-12 (CYANOCOBALAMIN) tablet 1,000 mcg  1,000 mcg Oral Daily Derrill Kay A, MD   1,000 mcg at 05/09/18 0912  . warfarin (COUMADIN) tablet 5 mg  5 mg Oral ONCE-1800 Isaac Bliss, Rayford Halsted, MD      . Warfarin - Pharmacist Dosing  Inpatient   Does not apply q1800 Isaac Bliss, Rayford Halsted, MD         Discharge Medications: Please see discharge summary for a list of discharge medications.  Relevant Imaging Results:  Relevant Lab Results:   Additional Information SSN: [id number]  Jefrey Raburn, Clydene Pugh, LCSW

## 2018-05-09 NOTE — Evaluation (Signed)
Occupational Therapy Evaluation Patient Details Name: Cody Velez MRN: [id number] DOB: [birth year] Today's Date: 05/09/2018    History of Present Illness Cody Velez is a 72 y.o. male with medical history significant of obstructive sleep apnea, hypertension, obesity, chronic back pain with multiple 3 previous surgeries who recently had an epidural abscess causing paraplegia surgically decompressed on March 28, 2018 has been on 8 weeks duration of cefazolin and rifampin until May 22, 2018.  Patient has been in short-term rehab and was just discharged this past Saturday.  He went home and since being home he is fallen several times.  Family is being him back in due to his frequent falls and the need for further rehab versus skilled nursing facility placement.  They are unaware why he was discharged from the rehab center on Saturday but suspect is because he ran out of insurance days but they are not sure.  They report that he is barely able to take a couple of steps without 2-3  people helping him with a walker.  When he had his injury initially he could not move his legs at all.  So he had had a lot of improvement with being able to move his legs and get to the point where he can take a couple steps.  But is not in any shape at this point to be able to get up and around at home.  Patient is being referred for admission for again further evaluation for more rehabilitation.   Clinical Impression   Patient in bed upon therapy arrival with daughter and girlfriend present. Patient reports increased pain and states that he received pain medication shortly before therapy arrival. Daughter reports that she feels that patient is back to "square one" from when he first arrived to inpatient rehab. It was reported that patient progressed well in CIR although once he returned home, he experienced frequent falls. Patient slide off the bed (elevated height) and also dropped to his knees when transferring to the  [facility] in the bathroom. During OT evaluation, patient presents with increased pain which limited assessment of functional performance during ADL tasks. Pt presents with decreased BUE strength and limited activity tolerance due to elevated pain level. During evaluation, family received phone call that CIR had declined patient's return although no reason was given. Due to CIR declining, OT is recommending patient discharge to SNF to focus on mentioned deficits in order to return home safely. Pt will receive skilled OT services while in acute care until his discharge.     Follow Up Recommendations  SNF    Equipment Recommendations  Hospital bed       Precautions / Restrictions Precautions Precautions: Fall;Back Precaution Booklet Issued: No Precaution Comments: Back precautions Required Braces or Orthoses: Spinal Brace Knee Immobilizer - Right: On when out of bed or walking Spinal Brace: Thoracolumbosacral orthotic;Applied in sitting position Restrictions Weight Bearing Restrictions: No Other Position/Activity Restrictions: sit upright in chair for spinal protection             ADL either performed or assessed with clinical judgement   ADL Overall ADL's : Needs assistance/impaired       General ADL Comments: ADL tasks not attempted due to high pain level. Based on patient's pain at this time estimate he would require Max-Total assistance for bathing and dressing tasks. Functional lateral scoot transfer completed with PT during evaluation. Patient required mod assist from bed to wheelchair and back.     Vision Baseline Vision/History: Wears glasses  Wears Glasses: Reading only              Pertinent Vitals/Pain Pain Assessment: 0-10 Pain Score: 10-Worst pain ever Pain Location: Back, bilateral legs, head Pain Descriptors / Indicators: Aching;Spasm;Nagging;Discomfort;Cramping;Shooting;Sharp;Radiating;Headache;Stabbing Pain Intervention(s): Limited activity within patient's  tolerance;Monitored during session;Premedicated before session;Repositioned     Hand Dominance Left   Extremity/Trunk Assessment Upper Extremity Assessment Upper Extremity Assessment: Generalized weakness(Left side weaker than right; MMT: Right: 4/5, Left: 4-/5. decreased gross grasp on left side. )   Lower Extremity Assessment Lower Extremity Assessment: Defer to PT evaluation       Communication Communication Communication: No difficulties   Cognition Arousal/Alertness: Awake/alert Behavior During Therapy: WFL for tasks assessed/performed Overall Cognitive Status: Within Functional Limits for tasks assessed                    Home Living Family/patient expects to be discharged to:: Inpatient rehab Living Arrangements: Spouse/significant other(Lives with Girfriend) Available Help at Discharge: Friend(s);Available 24 hours/day(girlfriend) Type of Home: House Home Access: Ramped entrance     Home Layout: Able to live on main level with bedroom/bathroom;Multi-level     Bathroom Shower/Tub: Tub/shower unit   Bathroom Toilet: Standard Bathroom Accessibility: Yes   Home Equipment: Walker - 2 wheels;Shower seat;Bedside commode;Cane - single point;Wheelchair - manual;Tub bench   Additional Comments: drop arm BSC, 30" slide board, wheelchair is a rental until he receives his.  Lives With: Significant other    Prior Functioning/Environment Level of Independence: Needs assistance  Gait / Transfers Assistance Needed: limited to a few steps using RW with assistance since back surgery ADL's / Homemaking Assistance Needed: assisted by family            OT Problem List: Decreased strength;Decreased knowledge of use of DME or AE;Decreased activity tolerance;Pain;Impaired balance (sitting and/or standing)      OT Treatment/Interventions: Self-care/ADL training;Modalities;Balance training;Therapeutic exercise;Therapeutic activities;Energy conservation;DME and/or AE  instruction;Patient/family education;Manual therapy    OT Goals(Current goals can be found in the care plan section) Acute Rehab OT Goals Patient Stated Goal: return to rehab OT Goal Formulation: With patient/family Time For Goal Achievement: 05/23/18 Potential to Achieve Goals: Good  OT Frequency: Min 2X/week   Barriers to D/C: Decreased caregiver support  Family unable to provide the appropriate amount of physical assistance needed at this time.          AM-PAC PT "6 Clicks" Daily Activity     Outcome Measure Help from another person eating meals?: None Help from another person taking care of personal grooming?: A Little Help from another person toileting, which includes using toliet, bedpan, or urinal?: Total Help from another person bathing (including washing, rinsing, drying)?: Total Help from another person to put on and taking off regular upper body clothing?: Total Help from another person to put on and taking off regular lower body clothing?: Total 6 Click Score: 11   End of Session    Activity Tolerance: Patient limited by pain Patient left: in bed;with bed alarm set;with family/visitor present  OT Visit Diagnosis: Muscle weakness (generalized) (M62.81)                Time: 9604-5409 OT Time Calculation (min): 16 min Charges:  OT General Charges $OT Visit: 1 Visit OT Evaluation $OT Eval High Complexity: 1 High G-Codes:     Ailene Ravel, OTR/L,CBIS  [phone]   Cody Velez, Cody Velez 05/09/2018, 4:57 PM

## 2018-05-09 NOTE — Progress Notes (Addendum)
[facility] Inpatient Rehab Admissions - Patient known to me from previous rehab stay from 03/30/18 to 05/06/18.  I will discuss with rehab team.  Likely patient will be most appropriate for SNF or home with [facility] therapies.  I will follow up.  Call me for questions.  #[phone]  I spoke with Dr. Naaman Plummer on rehab.  Noted patient has fallen a couple of times.  Dr. Naaman Plummer says patient will likely need SNF placement.  Call me for questions.  #[phone]

## 2018-05-10 ENCOUNTER — Observation Stay (HOSPITAL_COMMUNITY): Payer: Medicare Other

## 2018-05-10 DIAGNOSIS — S3992XA Unspecified injury of lower back, initial encounter: Secondary | ICD-10-CM | POA: Diagnosis not present

## 2018-05-10 DIAGNOSIS — R296 Repeated falls: Secondary | ICD-10-CM | POA: Diagnosis not present

## 2018-05-10 DIAGNOSIS — Z9689 Presence of other specified functional implants: Secondary | ICD-10-CM

## 2018-05-10 DIAGNOSIS — A4101 Sepsis due to Methicillin susceptible Staphylococcus aureus: Principal | ICD-10-CM

## 2018-05-10 DIAGNOSIS — S299XXA Unspecified injury of thorax, initial encounter: Secondary | ICD-10-CM | POA: Diagnosis not present

## 2018-05-10 DIAGNOSIS — R413 Other amnesia: Secondary | ICD-10-CM

## 2018-05-10 DIAGNOSIS — G894 Chronic pain syndrome: Secondary | ICD-10-CM | POA: Diagnosis not present

## 2018-05-10 DIAGNOSIS — S199XXA Unspecified injury of neck, initial encounter: Secondary | ICD-10-CM | POA: Diagnosis not present

## 2018-05-10 DIAGNOSIS — G062 Extradural and subdural abscess, unspecified: Secondary | ICD-10-CM | POA: Diagnosis not present

## 2018-05-10 DIAGNOSIS — G8220 Paraplegia, unspecified: Secondary | ICD-10-CM | POA: Diagnosis not present

## 2018-05-10 LAB — MAGNESIUM: Magnesium: 1.7 mg/dL (ref 1.7–2.4)

## 2018-05-10 LAB — CBC
HCT: 33 % — ABNORMAL LOW (ref 39.0–52.0)
Hemoglobin: 9.4 g/dL — ABNORMAL LOW (ref 13.0–17.0)
MCH: 23.5 pg — ABNORMAL LOW (ref 26.0–34.0)
MCHC: 28.5 g/dL — ABNORMAL LOW (ref 30.0–36.0)
MCV: 82.5 fL (ref 78.0–100.0)
Platelets: 532 10*3/uL — ABNORMAL HIGH (ref 150–400)
RBC: 4 MIL/uL — ABNORMAL LOW (ref 4.22–5.81)
RDW: 16.6 % — ABNORMAL HIGH (ref 11.5–15.5)
WBC: 9.4 10*3/uL (ref 4.0–10.5)

## 2018-05-10 LAB — BASIC METABOLIC PANEL
Anion gap: 7 (ref 5–15)
BUN: 9 mg/dL (ref 6–20)
CO2: 29 mmol/L (ref 22–32)
Calcium: 8.9 mg/dL (ref 8.9–10.3)
Chloride: 99 mmol/L — ABNORMAL LOW (ref 101–111)
Creatinine, Ser: 0.74 mg/dL (ref 0.61–1.24)
GFR calc Af Amer: >60 mL/min (ref >60)
GFR calc non Af Amer: >60 mL/min (ref >60)
Glucose, Bld: 109 mg/dL — ABNORMAL HIGH (ref 65–99)
Potassium: 3.9 mmol/L (ref 3.5–5.1)
Sodium: 135 mmol/L (ref 135–145)

## 2018-05-10 LAB — PROTIME-INR
INR: 2.85
Prothrombin Time: 29.7 seconds — ABNORMAL HIGH (ref 11.4–15.2)

## 2018-05-10 LAB — VITAMIN B12: Vitamin B-12: 794 pg/mL (ref 180–914)

## 2018-05-10 MED ORDER — WARFARIN SODIUM 7.5 MG PO TABS
7.5000 mg | ORAL_TABLET | Freq: Once | ORAL | Status: AC
  Administered 2018-05-10: 7.5 mg via ORAL
  Filled 2018-05-10: qty 1

## 2018-05-10 MED ORDER — MAGNESIUM SULFATE 2 GM/50ML IV SOLN
2.0000 g | Freq: Once | INTRAVENOUS | Status: AC
  Administered 2018-05-10: 2 g via INTRAVENOUS
  Filled 2018-05-10: qty 50

## 2018-05-10 MED ORDER — IOPAMIDOL (ISOVUE-300) INJECTION 61%
150.0000 mL | Freq: Once | INTRAVENOUS | Status: AC | PRN
  Administered 2018-05-10: 150 mL via INTRAVENOUS

## 2018-05-10 NOTE — Progress Notes (Signed)
Physical Therapy Treatment Patient Details Name: Cody Velez MRN: [id number] DOB: October 01, 1946 Today's Date: 05/10/2018    History of Present Illness Cody Velez is a 72 y.o. male with medical history significant of obstructive sleep apnea, hypertension, obesity, chronic back pain with multiple 3 previous surgeries who recently had an epidural abscess causing paraplegia surgically decompressed on March 28, 2018 has been on 8 weeks duration of cefazolin and rifampin until May 22, 2018.  Patient has been in short-term rehab and was just discharged this past Saturday.  He went home and since being home he is fallen several times.  Family is being him back in due to his frequent falls and the need for further rehab versus skilled nursing facility placement.  They are unaware why he was discharged from the rehab center on Saturday but suspect is because he ran out of insurance days but they are not sure.  They report that he is barely able to take a couple of steps without 2-3  people helping him with a walker.  When he had his injury initially he could not move his legs at all.  So he had had a lot of improvement with being able to move his legs and get to the point where he can take a couple steps.  But is not in any shape at this point to be able to get up and around at home.  Patient is being referred for admission for again further evaluation for more rehabilitation.    PT Comments    Pt supine in bed and willing to participate.  Pt limited by pain 10/10 in back and headache today, reports pre-medicated.  Pt improving bed mobility, increased time to complete and assistance required min A for LE for supine to sidelying to sitting.  Pt able to complete sliding transfer using sliding board with only min guard.  Verbal cueing to improve lateral sliding into WC.  EOS pt left in [facility] with 2 family members in room.    Follow Up Recommendations  CIR;Supervision for mobility/OOB     Equipment  Recommendations  None recommended by PT    Recommendations for Other Services       Precautions / Restrictions Precautions Precautions: Fall;Back Precaution Comments: Back precautions Required Braces or Orthoses: Spinal Brace Knee Immobilizer - Right: On when out of bed or walking Spinal Brace: Thoracolumbosacral orthotic;Applied in sitting position Restrictions Weight Bearing Restrictions: No Other Position/Activity Restrictions: sit upright in chair for spinal protection    Mobility  Bed Mobility Overal bed mobility: Needs Assistance Bed Mobility: Sit to Supine;Supine to Sit;Rolling Rolling: Min guard   Supine to sit: Min guard     General bed mobility comments: slow labored movement, need help to move BLE when rolling side to side  Transfers Overall transfer level: Modified independent Equipment used: Sliding board Transfers: Lateral/Scoot Transfers          Lateral/Scoot Transfers: Min assist General transfer comment: unable to sit to stand due to BLE weakness/apprehension, required  use of sliding to for bed<>w/c transfers  Ambulation/Gait                 Stairs             Wheelchair Mobility    Modified Rankin (Stroke Patients Only)       Balance  Cognition Arousal/Alertness: Awake/alert Behavior During Therapy: WFL for tasks assessed/performed Overall Cognitive Status: Within Functional Limits for tasks assessed                                        Exercises      General Comments        Pertinent Vitals/Pain Pain Score: 10-Worst pain ever Pain Location: Back, bilateral legs, head Pain Descriptors / Indicators: Aching;Spasm;Nagging;Discomfort;Cramping;Shooting;Sharp;Radiating;Headache;Stabbing Pain Intervention(s): Limited activity within patient's tolerance;Monitored during session;Premedicated before session;Repositioned    Home Living                       Prior Function            PT Goals (current goals can now be found in the care plan section) Progress towards PT goals: Progressing toward goals    Frequency    Min 4X/week      PT Plan Current plan remains appropriate    Co-evaluation              AM-PAC PT "6 Clicks" Daily Activity  Outcome Measure  Difficulty turning over in bed (including adjusting bedclothes, sheets and blankets)?: A Lot Difficulty moving from lying on back to sitting on the side of the bed? : A Lot Difficulty sitting down on and standing up from a chair with arms (e.g., wheelchair, bedside commode, etc,.)?: Unable Help needed moving to and from a bed to chair (including a wheelchair)?: Total Help needed walking in hospital room?: Total Help needed climbing 3-5 steps with a railing? : Total 6 Click Score: 8    End of Session Equipment Utilized During Treatment: Gait belt Activity Tolerance: Patient tolerated treatment well;Patient limited by fatigue;Patient limited by pain Patient left: in chair;with call bell/phone within reach;with family/visitor present(in WC) Nurse Communication: Mobility status PT Visit Diagnosis: Unsteadiness on feet (R26.81);Other abnormalities of gait and mobility (R26.89);Muscle weakness (generalized) (M62.81)     Time: 3662-9476 PT Time Calculation (min) (ACUTE ONLY): 27 min  Charges:  $Therapeutic Activity: 23-37 mins                    G Codes:       Ihor Austin, LPTA; CBIS [phone]  Aldona Lento 05/10/2018, 12:18 PM

## 2018-05-10 NOTE — Progress Notes (Signed)
PROGRESS NOTE  Cody Velez ALP:[id number] DOB: 06-12-1946 DOA: 05/08/2018 PCP: Asencion Noble, MD  Brief History:  72 year old male with a history of hypertension, hyperlipidemia, morbid obesity, coronary artery disease, chronic back pain status post spinal stimulator(C-spine) placed 1/30/19presented with increasing mechanical falls at home.  Patient was recently discharged from inpatient rehabilitation at [facility] after a stay from 03/30/2018 through 05/06/2018.  Apparently, the patient was making significant progress at that time.  However since discharge home, the patient has noted increasing lower extremity weakness and increasing falling.  As result, the patient represented to the hospital on 05/08/2018 for further evaluation.  He denies any fevers, chills, chest pain, shortness of breath, nausea, vomiting, diarrhea.  Initial radiographs of his left knee and hip were unremarkable for fracture or dislocation.  The patient has a rather complicated history.  The patient was initially admitted to the hospital in early April 2019.  He was subsequently found to have MSSA bacteremia.  Extensive work-up at that time revealed epidural abscess and T10-11 ostium myelitis and discitis.  Patient underwent surgery with laminectomy, decompression of epidural abscess, and methacrylate screw augmentation on 03/28/2018 performed by Dr. Ellene Route.  The patient was followed by infectious disease.  They recommended cefazolin and rifampin for 8 weeks to be completed on 05/22/2018.  He was subsequently discharged to inpatient rehabilitation on 03/30/2018.  Since his admission on 05/08/2018, the patient has developed continued lower extremity weakness and myoclonus.  He has not had any fevers.  As result, repeat CT imaging of his cervical, thoracic, lumbar spine have been ordered.  Assessment/Plan: Epidural abscess/vertebral osteomyelitis/discitis -Patient had blood cultures growing MSSA previously -Continue  cefazolin and rifampin with a tentative stop date of 05/22/2018 -Status post I&D and laminectomy 03/28/2018, Dr. Ellene Route  Lower extremity weakness/frequent falls -Patient feels that his lower extremity weakness is worse than when he left inpatient rehab -Check ESR -Check CRP -Repeat CT scan cervical, thoracic, lumbar spine -Serum B12 794 -TSH 0.867  Myoclonus -May be due to his opioids versus new spinal pathology versus gabapentin -EEG -CPK  Acute DVT right peroneal vein, left soleal vein -Noted on ultrasound 03/31/2018 -Continue warfarin  Essential hypertension -Continue amlodipine and lisinopril  Chronic pain syndrome -Minimize hypnotic and opioids -Continue gabapentin--decrease dose for renal failure  Depression -Continue home dose of venlafaxine -Holding trazodone in the setting of encephalopathy  Hyperlipidemia -Continue statin  Coronary artery disease -No chest pain presently  Cognitive impairment -Continue Aricept and Namenda      Disposition Plan:   SNF pending work up for leg weakness Family Communication:   Daughter updated at bedside 5/22  Consultants:  none  Code Status:  FULL  DVT Prophylaxis:  coumadin   Procedures: As Listed in Progress Note Above  Antibiotics: Cefazolin rifampin    Subjective: Patient states that his back pain overall has improved but he continues to have leg weakness.  He denies any chest pain, shortness breath, coughing, hemoptysis, nausea, vomiting, diarrhea, abdominal pain.  He states that his neck feels stiff.  Objective: Vitals:   05/09/18 1353 05/09/18 2108 05/09/18 2124 05/10/18 0709  BP: 128/82  122/75 (!) 149/88  Pulse: 91  84 86  Resp: '18  18 20  ' Temp: 97.8 F (36.6 C)  99 F (37.2 C) 98.2 F (36.8 C)  TempSrc: Oral  Oral Oral  SpO2: 99% 98% 97% 99%  Weight:      Height:  Intake/Output Summary (Last 24 hours) at 05/10/2018 1555 Last data filed at 05/10/2018 0930 Gross per 24 hour  Intake  540 ml  Output 1000 ml  Net -460 ml   Weight change:  Exam:   General:  Pt is alert, follows commands appropriately, not in acute distress  HEENT: No icterus, No thrush, No neck mass, [place]/AT  Cardiovascular: RRR, S1/S2, no rubs, no gallops  Respiratory: Bibasilar crackles.  No wheezing.  Good air movement.  Abdomen: Soft/+BS, non tender, non distended, no guarding  Extremities: trace LE edema, No lymphangitis, No petechiae, No rashes, no synovitis  Neuro:  CN II-XII intact, strength 3-/5 in  RLE, strength 3-/5  LLE; sensation intact bilateral; no dysmetria; babinski equivocal     Data Reviewed: I have personally reviewed following labs and imaging studies Basic Metabolic Panel: Recent Labs  Lab 05/08/18 1839 05/09/18 0633 05/10/18 0933  NA 134*  --  135  K 4.0  --  3.9  CL 100*  --  99*  CO2 26  --  29  GLUCOSE 116*  --  109*  BUN 10  --  9  CREATININE 0.79  --  0.74  CALCIUM 8.8*  --  8.9  MG  --  1.7 1.7   Liver Function Tests: Recent Labs  Lab 05/08/18 1839  AST 12*  ALT 7*  ALKPHOS 99  BILITOT 0.5  PROT 7.5  ALBUMIN 2.7*   No results for input(s): LIPASE, AMYLASE in the last 168 hours. No results for input(s): AMMONIA in the last 168 hours. Coagulation Profile: Recent Labs  Lab 05/05/18 0355 05/06/18 0411 05/08/18 1839 05/09/18 0633 05/10/18 0625  INR 2.37 2.18 2.82 2.93 2.85   CBC: Recent Labs  Lab 05/06/18 0411 05/08/18 1839 05/10/18 0933  WBC 6.6 12.0* 9.4  NEUTROABS  --  8.7*  --   HGB 9.4* 10.0* 9.4*  HCT 33.1* 34.5* 33.0*  MCV 83.6 82.5 82.5  PLT 588* 579* 532*   Cardiac Enzymes: No results for input(s): CKTOTAL, CKMB, CKMBINDEX, TROPONINI in the last 168 hours. BNP: Invalid input(s): POCBNP CBG: No results for input(s): GLUCAP in the last 168 hours. HbA1C: No results for input(s): HGBA1C in the last 72 hours. Urine analysis:    Component Value Date/Time   COLORURINE AMBER (A) 04/05/2018 1621   APPEARANCEUR CLEAR  04/05/2018 1621   LABSPEC 1.029 04/05/2018 1621   PHURINE 5.0 04/05/2018 1621   GLUCOSEU NEGATIVE 04/05/2018 1621   HGBUR NEGATIVE 04/05/2018 1621   BILIRUBINUR MODERATE (A) 04/05/2018 1621   KETONESUR NEGATIVE 04/05/2018 1621   PROTEINUR 30 (A) 04/05/2018 1621   UROBILINOGEN 0.2 03/05/2011 1909   NITRITE NEGATIVE 04/05/2018 1621   LEUKOCYTESUR NEGATIVE 04/05/2018 1621   Sepsis Labs: '@LABRCNTIP' (procalcitonin:4,lacticidven:4) )No results found for this or any previous visit (from the past 240 hour(s)).   Scheduled Meds: . amLODipine  10 mg Oral Daily  . aspirin  81 mg Oral Daily  . docusate sodium  200 mg Oral BID  . donepezil  10 mg Oral QHS  . finasteride  5 mg Oral Daily  . gabapentin  600 mg Oral TID  . latanoprost  1 drop Both Eyes QHS  . linaclotide  290 mcg Oral QAC breakfast  . lisinopril  5 mg Oral Daily  . Melatonin  1.5 mg Oral QHS  . memantine  10 mg Oral BID  . methocarbamol  500 mg Oral QID  . potassium chloride SA  20 mEq Oral Daily  . pravastatin  10  mg Oral QPM  . rifampin  300 mg Oral Q12H  . sodium chloride flush  3 mL Intravenous Q12H  . traZODone  100 mg Oral QHS  . venlafaxine XR  150 mg Oral BID  . vitamin B-12  1,000 mcg Oral Daily  . warfarin  7.5 mg Oral ONCE-1800  . Warfarin - Pharmacist Dosing Inpatient   Does not apply q1800   Continuous Infusions: . sodium chloride    .  ceFAZolin (ANCEF) IV Stopped (05/10/18 0854)  . magnesium sulfate 1 - 4 g bolus IVPB      Procedures/Studies: Dg Chest 1 View  Result Date: 04/25/2018 CLINICAL DATA:  Possible malfunction of the right-sided PICC line. EXAM: CHEST  1 VIEW COMPARISON:  Chest x-ray dated March 17, 2018 FINDINGS: The right-sided PICC line tip terminates at the junction of the right and left brachiocephalic veins. The lungs are well-expanded. The interstitial markings are increased. There is no alveolar infiltrate or significant pleural effusion. The heart is normal in size. The pulmonary  vascularity is not engorged. There is calcification in the wall of the thoracic aorta. The patient has undergone previous mid and distal thoracic spinal fusion. There are nerve stimulator electrodes present which terminate in the lower cervical region. IMPRESSION: COPD. Mild interstitial prominence may reflect pulmonary fibrosis or low-grade interstitial edema. Thoracic aortic atherosclerosis. Electronically Signed   By: Tremell Reimers  [place] M.D.   On: 04/25/2018 13:52   Dg Lumbar Spine 2-3 Views  Result Date: 05/08/2018 CLINICAL DATA:  Low back pain after a fall. EXAM: LUMBAR SPINE - 2-3 VIEW COMPARISON:  Spot fluoro films from 03/28/2018. FINDINGS: Patient is status post extensive thoracolumbar fusion posteriorly with incomplete visualization of the thoracolumbar fusion hardware. Image portions of the hardware show no complicating features. Interbody cages are seen at L4-5 and L5-S1. Bones are diffusely demineralized. Although no definite compression deformity is evident, lateral film is limited in utility given the demineralization. IMPRESSION: No evidence for an acute fracture on this limited two-view study. Electronically Signed   By: Misty Stanley M.D.   On: 05/08/2018 16:58   Dg Knee 1-2 Views Left  Result Date: 05/08/2018 CLINICAL DATA:  [place] landing on bilateral knees. EXAM: RIGHT KNEE - 1-2 VIEW; LEFT KNEE - 1-2 VIEW COMPARISON:  MRI of the LEFT knee November 14, 2009 FINDINGS: LEFT: No acute fracture deformity or dislocation. Moderate to severe tricompartmental joint space narrowing with periarticular sclerosis and marginal spurring. Osteopenia without destructive bony lesions. Small suprapatellar joint effusion. Mild vascular calcifications. RIGHT: No acute fracture deformity dislocation. Moderate tricompartmental joint space narrowing with periarticular sclerosis and marginal spurring. No destructive bony lesions. Osteopenia. Soft tissue planes are nonsuspicious. IMPRESSION: 1. No acute  fracture deformity dislocation. Small LEFT suprapatellar joint effusion. 2. Moderate RIGHT and moderate to severe LEFT tricompartmental osteoarthrosis. Electronically Signed   By: Elon Alas M.D.   On: 05/08/2018 16:55   Dg Knee 1-2 Views Right  Result Date: 05/08/2018 CLINICAL DATA:  [place] landing on bilateral knees. EXAM: RIGHT KNEE - 1-2 VIEW; LEFT KNEE - 1-2 VIEW COMPARISON:  MRI of the LEFT knee November 14, 2009 FINDINGS: LEFT: No acute fracture deformity or dislocation. Moderate to severe tricompartmental joint space narrowing with periarticular sclerosis and marginal spurring. Osteopenia without destructive bony lesions. Small suprapatellar joint effusion. Mild vascular calcifications. RIGHT: No acute fracture deformity dislocation. Moderate tricompartmental joint space narrowing with periarticular sclerosis and marginal spurring. No destructive bony lesions. Osteopenia. Soft tissue planes are nonsuspicious. IMPRESSION: 1. No  acute fracture deformity dislocation. Small LEFT suprapatellar joint effusion. 2. Moderate RIGHT and moderate to severe LEFT tricompartmental osteoarthrosis. Electronically Signed   By: Elon Alas M.D.   On: 05/08/2018 16:55   Dg Chest Portable 1 View  Result Date: 05/08/2018 CLINICAL DATA:  Verify PICC line placement. EXAM: PORTABLE CHEST 1 VIEW COMPARISON:  Apr 25, 2018 FINDINGS: A right PICC line is identified. The distal tip is difficult to visualize but appears to terminate in the central SVC. No change in the cardiomediastinal silhouette. No pneumothorax. No other acute interval changes. IMPRESSION: The right PICC line appears to be in good position. The distal tip is difficult to visualize with confidence but appears to be in the central SVC. Electronically Signed   By: Dorise Bullion III M.D   On: 05/08/2018 16:03   Dg Hip Unilat With Pelvis 2-3 Views Right  Result Date: 05/08/2018 CLINICAL DATA:  Fall, right hip pain EXAM: DG HIP (WITH OR  WITHOUT PELVIS) 2-3V RIGHT COMPARISON:  None. FINDINGS: No fracture or dislocation is seen. Bilateral hip joint spaces are preserved. Visualized bony pelvis appears intact. Degenerative changes at L5-S1. IMPRESSION: Negative. Electronically Signed   By: Julian Hy M.D.   On: 05/08/2018 16:53   [place] Ekg Site Rite  Result Date: 04/25/2018 If Site Rite image not attached, placement could not be confirmed due to current cardiac rhythm.   Orson Eva, DO  [facility] Pager [phone]  If 7PM-7AM, please contact night-coverage [email or web address] Password [email or web address] 05/10/2018, 3:55 PM   LOS: 0 days

## 2018-05-10 NOTE — Care Management Obs Status (Signed)
[facility] NOTIFICATION   Patient Details  Name: Cody Velez MRN: [id number] Date of Birth: [birth year]   Medicare Observation Status Notification Given:  Yes    Gilberta Peeters, Chauncey Reading, RN 05/10/2018, 7:42 AM

## 2018-05-10 NOTE — Clinical Social Work Note (Signed)
LCSW left a message for Remigio Eisenmenger with the [facility] Requesting return contact.     Mariadelaluz [place], Clydene Pugh, LCSW

## 2018-05-10 NOTE — Progress Notes (Signed)
ANTICOAGULATION CONSULT NOTE - initial Consult  Pharmacy Consult for warfarin Indication: DVT  No Known Allergies  Patient Measurements: Height: 6\' 3"  (190.5 cm) Weight: 275 lb (124.7 kg) IBW/kg (Calculated) : 84.5   Vital Signs: Temp: 98.2 F (36.8 C) (05/22 0709) Temp Source: Oral (05/22 0709) BP: 149/88 (05/22 0709) Pulse Rate: 86 (05/22 0709)  Labs: Recent Labs    05/08/18 1839 05/09/18 0633 05/10/18 0625  HGB 10.0*  --   --   HCT 34.5*  --   --   PLT 579*  --   --   LABPROT 29.4* 30.3* 29.7*  INR 2.82 2.93 2.85  CREATININE 0.79  --   --     Estimated Creatinine Clearance: 120.5 mL/min (by C-G formula based on SCr of 0.79 mg/dL).   Assessment: 24 YOM  Recently discharged from [facility] with MSSA bacteremia currently being treated with cefazolin and rifampin through 05/22/18 (per ID recs). DVT noted on doppler 4/12, decision to anticoagulate is complicated by rifampin drug interaction with all oral anticoagulants. INR is therapeutic at 2.93 Home regimen is warfarin 10mg  on Mondays and Fridays, 7.5mg  all other days.  Goal of Therapy:  INR 2-3 Monitor platelets by anticoagulation protocol: Yes   Plan:  Warfarin 7.5 mg x 1 dose PT-INR daily Monitor for S/S of bleeding  Margot Ables, PharmD Clinical Pharmacist 05/10/2018 8:13 AM

## 2018-05-11 ENCOUNTER — Inpatient Hospital Stay (HOSPITAL_COMMUNITY)
Admit: 2018-05-11 | Discharge: 2018-05-11 | Disposition: A | Discharge status: Home / Self Care | Payer: Medicare Other | Attending: Internal Medicine | Admitting: Internal Medicine

## 2018-05-11 DIAGNOSIS — M4654 Other infective spondylopathies, thoracic region: Secondary | ICD-10-CM | POA: Diagnosis not present

## 2018-05-11 DIAGNOSIS — G839 Paralytic syndrome, unspecified: Secondary | ICD-10-CM | POA: Diagnosis not present

## 2018-05-11 DIAGNOSIS — G062 Extradural and subdural abscess, unspecified: Secondary | ICD-10-CM | POA: Diagnosis not present

## 2018-05-11 DIAGNOSIS — M4624 Osteomyelitis of vertebra, thoracic region: Secondary | ICD-10-CM | POA: Diagnosis not present

## 2018-05-11 DIAGNOSIS — M25461 Effusion, right knee: Secondary | ICD-10-CM | POA: Diagnosis not present

## 2018-05-11 DIAGNOSIS — M11262 Other chondrocalcinosis, left knee: Secondary | ICD-10-CM | POA: Diagnosis not present

## 2018-05-11 DIAGNOSIS — F329 Major depressive disorder, single episode, unspecified: Secondary | ICD-10-CM | POA: Diagnosis present

## 2018-05-11 DIAGNOSIS — Z9689 Presence of other specified functional implants: Secondary | ICD-10-CM | POA: Diagnosis not present

## 2018-05-11 DIAGNOSIS — M11269 Other chondrocalcinosis, unspecified knee: Secondary | ICD-10-CM | POA: Diagnosis not present

## 2018-05-11 DIAGNOSIS — M25061 Hemarthrosis, right knee: Secondary | ICD-10-CM | POA: Diagnosis not present

## 2018-05-11 DIAGNOSIS — I82493 Acute embolism and thrombosis of other specified deep vein of lower extremity, bilateral: Secondary | ICD-10-CM | POA: Diagnosis not present

## 2018-05-11 DIAGNOSIS — G8220 Paraplegia, unspecified: Secondary | ICD-10-CM | POA: Diagnosis not present

## 2018-05-11 DIAGNOSIS — R7881 Bacteremia: Secondary | ICD-10-CM | POA: Diagnosis not present

## 2018-05-11 DIAGNOSIS — R296 Repeated falls: Secondary | ICD-10-CM | POA: Diagnosis not present

## 2018-05-11 DIAGNOSIS — F4310 Post-traumatic stress disorder, unspecified: Secondary | ICD-10-CM | POA: Diagnosis present

## 2018-05-11 DIAGNOSIS — M00061 Staphylococcal arthritis, right knee: Secondary | ICD-10-CM | POA: Diagnosis not present

## 2018-05-11 DIAGNOSIS — W19XXXA Unspecified fall, initial encounter: Secondary | ICD-10-CM | POA: Diagnosis present

## 2018-05-11 DIAGNOSIS — M25562 Pain in left knee: Secondary | ICD-10-CM | POA: Diagnosis not present

## 2018-05-11 DIAGNOSIS — G4733 Obstructive sleep apnea (adult) (pediatric): Secondary | ICD-10-CM | POA: Diagnosis present

## 2018-05-11 DIAGNOSIS — I2510 Atherosclerotic heart disease of native coronary artery without angina pectoris: Secondary | ICD-10-CM | POA: Diagnosis not present

## 2018-05-11 DIAGNOSIS — G9340 Encephalopathy, unspecified: Secondary | ICD-10-CM | POA: Diagnosis not present

## 2018-05-11 DIAGNOSIS — S8002XA Contusion of left knee, initial encounter: Secondary | ICD-10-CM | POA: Diagnosis present

## 2018-05-11 DIAGNOSIS — M47812 Spondylosis without myelopathy or radiculopathy, cervical region: Secondary | ICD-10-CM | POA: Diagnosis not present

## 2018-05-11 DIAGNOSIS — M11261 Other chondrocalcinosis, right knee: Secondary | ICD-10-CM | POA: Diagnosis not present

## 2018-05-11 DIAGNOSIS — S8001XA Contusion of right knee, initial encounter: Secondary | ICD-10-CM | POA: Diagnosis present

## 2018-05-11 DIAGNOSIS — M25462 Effusion, left knee: Secondary | ICD-10-CM | POA: Diagnosis not present

## 2018-05-11 DIAGNOSIS — G253 Myoclonus: Secondary | ICD-10-CM | POA: Diagnosis not present

## 2018-05-11 DIAGNOSIS — K219 Gastro-esophageal reflux disease without esophagitis: Secondary | ICD-10-CM | POA: Diagnosis present

## 2018-05-11 DIAGNOSIS — M25062 Hemarthrosis, left knee: Secondary | ICD-10-CM | POA: Diagnosis not present

## 2018-05-11 DIAGNOSIS — G061 Intraspinal abscess and granuloma: Secondary | ICD-10-CM | POA: Diagnosis not present

## 2018-05-11 DIAGNOSIS — Z6835 Body mass index (BMI) 35.0-35.9, adult: Secondary | ICD-10-CM | POA: Diagnosis not present

## 2018-05-11 DIAGNOSIS — M898X8 Other specified disorders of bone, other site: Secondary | ICD-10-CM | POA: Diagnosis not present

## 2018-05-11 DIAGNOSIS — G894 Chronic pain syndrome: Secondary | ICD-10-CM | POA: Diagnosis not present

## 2018-05-11 DIAGNOSIS — A4101 Sepsis due to Methicillin susceptible Staphylococcus aureus: Secondary | ICD-10-CM | POA: Diagnosis not present

## 2018-05-11 DIAGNOSIS — I10 Essential (primary) hypertension: Secondary | ICD-10-CM | POA: Diagnosis not present

## 2018-05-11 DIAGNOSIS — B9561 Methicillin susceptible Staphylococcus aureus infection as the cause of diseases classified elsewhere: Secondary | ICD-10-CM | POA: Diagnosis not present

## 2018-05-11 DIAGNOSIS — F0390 Unspecified dementia without behavioral disturbance: Secondary | ICD-10-CM | POA: Diagnosis present

## 2018-05-11 DIAGNOSIS — M009 Pyogenic arthritis, unspecified: Secondary | ICD-10-CM | POA: Diagnosis not present

## 2018-05-11 DIAGNOSIS — Z87891 Personal history of nicotine dependence: Secondary | ICD-10-CM | POA: Diagnosis not present

## 2018-05-11 DIAGNOSIS — E785 Hyperlipidemia, unspecified: Secondary | ICD-10-CM | POA: Diagnosis present

## 2018-05-11 DIAGNOSIS — M25561 Pain in right knee: Secondary | ICD-10-CM | POA: Diagnosis not present

## 2018-05-11 DIAGNOSIS — M4804 Spinal stenosis, thoracic region: Secondary | ICD-10-CM | POA: Diagnosis not present

## 2018-05-11 LAB — CBC
HCT: 33 % — ABNORMAL LOW (ref 39.0–52.0)
Hemoglobin: 9.4 g/dL — ABNORMAL LOW (ref 13.0–17.0)
MCH: 23.8 pg — ABNORMAL LOW (ref 26.0–34.0)
MCHC: 28.5 g/dL — ABNORMAL LOW (ref 30.0–36.0)
MCV: 83.5 fL (ref 78.0–100.0)
Platelets: 579 10*3/uL — ABNORMAL HIGH (ref 150–400)
RBC: 3.95 MIL/uL — ABNORMAL LOW (ref 4.22–5.81)
RDW: 16.8 % — ABNORMAL HIGH (ref 11.5–15.5)
WBC: 8 10*3/uL (ref 4.0–10.5)

## 2018-05-11 LAB — CK: Total CK: 15 U/L — ABNORMAL LOW (ref 49–397)

## 2018-05-11 LAB — C-REACTIVE PROTEIN: CRP: 22 mg/dL — ABNORMAL HIGH (ref <1.0)

## 2018-05-11 LAB — PROTIME-INR
INR: 2.81
INR: 1.54
Prothrombin Time: 29.4 seconds — ABNORMAL HIGH (ref 11.4–15.2)
Prothrombin Time: 18.4 seconds — ABNORMAL HIGH (ref 11.4–15.2)

## 2018-05-11 LAB — MAGNESIUM: Magnesium: 2 mg/dL (ref 1.7–2.4)

## 2018-05-11 LAB — SEDIMENTATION RATE: Sed Rate: 125 mm/hr — ABNORMAL HIGH (ref 0–16)

## 2018-05-11 MED ORDER — VITAMIN K1 10 MG/ML IJ SOLN
5.0000 mg | Freq: Once | INTRAVENOUS | Status: AC
  Administered 2018-05-11: 5 mg via INTRAVENOUS
  Filled 2018-05-11: qty 0.5

## 2018-05-11 MED ORDER — HEPARIN (PORCINE) IN NACL 100-0.45 UNIT/ML-% IJ SOLN
2400.0000 [IU]/h | INTRAMUSCULAR | Status: DC
  Administered 2018-05-11 – 2018-05-12 (×2): 1600 [IU]/h via INTRAVENOUS
  Administered 2018-05-13: 1900 [IU]/h via INTRAVENOUS
  Administered 2018-05-13 (×2): 2400 [IU]/h via INTRAVENOUS
  Administered 2018-05-13: 2200 [IU]/h via INTRAVENOUS
  Administered 2018-05-14 – 2018-05-18 (×9): 2400 [IU]/h via INTRAVENOUS
  Filled 2018-05-11 (×13): qty 250

## 2018-05-11 MED ORDER — PANTOPRAZOLE SODIUM 40 MG PO TBEC
40.0000 mg | DELAYED_RELEASE_TABLET | Freq: Every day | ORAL | Status: DC
  Administered 2018-05-11 – 2018-05-22 (×12): 40 mg via ORAL
  Filled 2018-05-11 (×12): qty 1

## 2018-05-11 MED ORDER — WARFARIN SODIUM 7.5 MG PO TABS: 7.5000 mg | ORAL_TABLET | Freq: Once | ORAL | Status: DC

## 2018-05-11 NOTE — Progress Notes (Addendum)
ANTICOAGULATION CONSULT NOTE - Initial Consult  Pharmacy Consult for heparin Indication: bridging for leg DVT after coumadin reversal for myelogram    No Known Allergies  Patient Measurements: Height: '6\' 3"'  (190.5 cm) Weight: 272 lb 4.3 oz (123.5 kg) IBW/kg (Calculated) : 84.5 Heparin Dosing Weight: 111.4 kg  Vital Signs: Temp: 98.5 F (36.9 C) (05/23 2017) Temp Source: Oral (05/23 2017) BP: 125/76 (05/23 2017) Pulse Rate: 86 (05/23 2017)  Labs: Recent Labs    05/10/18 0625 05/10/18 0933 05/11/18 0659 05/11/18 1953  HGB  --  9.4* 9.4*  --   HCT  --  33.0* 33.0*  --   PLT  --  532* 579*  --   LABPROT 29.7*  --  29.4* 18.4*  INR 2.85  --  2.81 1.54  CREATININE  --  0.74  --   --   CKTOTAL  --   --  15*  --     Estimated Creatinine Clearance: 119.9 mL/min (by C-G formula based on SCr of 0.74 mg/dL).   Medical History: Past Medical History:  Diagnosis Date  . Allergic rhinitis   . Arthritis   . Asthma    as a child  . B12 deficiency   . Cancer [facility])    prostate  . Cervicogenic headache 01/28/2016  . Chronic back pain   . Coronary atherosclerosis of native coronary artery    Mild nonobstructive CAD at catheterization January 2015  . Depression   . Essential hypertension, benign   . Falls   . GERD (gastroesophageal reflux disease)   . History of cerebrovascular disease 07/23/2015  . History of pneumonia 02/2011  . Hyperlipidemia   . Kidney stone   . Memory difficulty 07/23/2015  . OSA (obstructive sleep apnea)    CPAP - Dr. [name]  . Prostate cancer ([place])   . PTSD (post-traumatic stress disorder)    [place]  . Rectal bleeding     Medications:  Medications Prior to Admission  Medication Sig Dispense Refill Last Dose  . amLODipine (NORVASC) 10 MG tablet Take 1 tablet (10 mg total) by mouth daily. 30 tablet 0 05/08/2018 at Unknown time  . aspirin 81 MG tablet Take 81 mg by mouth daily.   05/08/2018 at Unknown time  . Baclofen 5 MG TABS Take 5 mg by mouth 3  (three) times daily as needed for muscle spasms. 30 tablet 0 05/08/2018 at Unknown time  . ceFAZolin (ANCEF) IVPB Inject 2 g into the vein every 8 (eight) hours for 18 days. Indication:  MSSA Bacteremia and extensive epidural abscess discitis of thoracic spine Last Day of Therapy: 05/22/2018 Labs - Once weekly:  CBC/D and BMP, Labs - Every other week:  ESR and CRP 53 Units 0 05/08/2018 at 800a  . cycloSPORINE (RESTASIS) 0.05 % ophthalmic emulsion 1 DROP INTO BOTH EYES TWICE A DAY FOR DRY EYES   05/08/2018 at Unknown time  . docusate sodium (COLACE) 100 MG capsule Take 200 mg by mouth 2 (two) times daily.   05/08/2018 at Unknown time  . donepezil (ARICEPT) 10 MG tablet Take 1 tablet (10 mg total) by mouth at bedtime. 90 tablet 3 05/07/2018 at Unknown time  . finasteride (PROSCAR) 5 MG tablet Take 1 tablet (5 mg total) by mouth daily. Reported on 05/04/2016 30 tablet 0 05/08/2018 at Unknown time  . fluticasone (FLONASE) 50 MCG/ACT nasal spray Place 1 spray into both nostrils daily as needed for allergies.    Past Week at Unknown time  . gabapentin (  NEURONTIN) 300 MG capsule Take 2 capsules (600 mg total) by mouth 3 (three) times daily. 90 capsule 0 05/08/2018 at Unknown time  . latanoprost (XALATAN) 0.005 % ophthalmic solution Place 1 drop into both eyes at bedtime.    05/07/2018 at Unknown time  . Melatonin 3 MG TABS Take 0.5 tablets (1.5 mg total) by mouth at bedtime. 30 tablet 0 05/07/2018 at Unknown time  . memantine (NAMENDA) 10 MG tablet Take 1 tablet (10 mg total) by mouth 2 (two) times daily. 180 tablet 3 05/08/2018 at Unknown time  . methocarbamol (ROBAXIN) 500 MG tablet Take 1 tablet (500 mg total) by mouth 4 (four) times daily. 180 tablet 0 05/08/2018 at Unknown time  . omeprazole (PRILOSEC) 20 MG capsule Take 2 capsules (40 mg total) by mouth daily. 60 capsule 0 05/08/2018 at Unknown time  . oxyCODONE 10 MG TABS Take 1 tablet (10 mg total) by mouth every 4 (four) hours as needed for moderate pain or  severe pain. 30 tablet 0 05/08/2018 at [place]  . pravastatin (PRAVACHOL) 10 MG tablet Take 1 tablet (10 mg total) by mouth every evening. 30 tablet 0 05/07/2018 at Unknown time  . rifampin (RIFADIN) 300 MG capsule Continue through 05/22/2018 and stop (Patient taking differently: Take 300 mg by mouth every 12 (twelve) hours. Continue through 05/22/2018 and stop) 60 capsule 0 05/08/2018 at Unknown time  . traZODone (DESYREL) 100 MG tablet Take 1 tablet (100 mg total) by mouth at bedtime. 30 tablet 0 05/07/2018 at Unknown time  . venlafaxine XR (EFFEXOR-XR) 150 MG 24 hr capsule Take 1 capsule (150 mg total) by mouth 2 (two) times daily. 60 capsule 0 05/08/2018 at Unknown time  . vitamin B-12 (CYANOCOBALAMIN) 1000 MCG tablet Take 1 tablet (1,000 mcg total) by mouth daily. 30 tablet 0 05/08/2018 at Unknown time  . warfarin (COUMADIN) 5 MG tablet Take 1.5 tablets (7.40m) on Sunday, Tuesday, Wednesday, Thursday, and Saturday.  Take 2 tablets (1102m on Monday and Friday. 60 tablet 1 05/08/2018 at 1000a  . lidocaine (LIDODERM) 5 % Place 1 patch onto the skin daily. Remove & Discard patch within 12 hours or as directed by MD (Patient not taking: Reported on 05/08/2018) 30 patch 0 Not Taking at Unknown time  . linaclotide (LINZESS) 290 MCG CAPS capsule Take 1 capsule (290 mcg total) by mouth daily before breakfast. 30 capsule 0   . lisinopril (PRINIVIL,ZESTRIL) 5 MG tablet Take 1 tablet (5 mg total) by mouth daily. 30 tablet 0   . potassium chloride SA (K-DUR,KLOR-CON) 20 MEQ tablet Take 1 tablet (20 mEq total) by mouth daily. 30 tablet 0     Assessment: Pharmacy consulted to do heparin for patient with bridging for leg DVT after coumadin reversal for myelogram. Vitamin K 5 mg PO given today at 1147. INR is now 1.54. Will start heparin now that subtherapeutic   Goal of Therapy:  Heparin level 0.3-0.7 units/ml Monitor platelets by anticoagulation protocol: Yes   Plan:  Start heparin infusion at 1600 units/hr Check  anti-Xa level in 8 hours and daily while on heparin Continue to monitor H&H and platelets  StRamond Craver/23/2019,9:40 PM

## 2018-05-11 NOTE — Progress Notes (Signed)
Physical Therapy Treatment Patient Details Name: Cody Velez MRN: [id number] DOB: 1946-09-26 Today's Date: 05/11/2018    History of Present Illness Cody Velez is a 72 y.o. male with medical history significant of obstructive sleep apnea, hypertension, obesity, chronic back pain with multiple 3 previous surgeries who recently had an epidural abscess causing paraplegia surgically decompressed on March 28, 2018 has been on 8 weeks duration of cefazolin and rifampin until May 22, 2018.  Patient has been in short-term rehab and was just discharged this past Saturday.  He went home and since being home he is fallen several times.  Family is being him back in due to his frequent falls and the need for further rehab versus skilled nursing facility placement.  They are unaware why he was discharged from the rehab center on Saturday but suspect is because he ran out of insurance days but they are not sure.  They report that he is barely able to take a couple of steps without 2-3  people helping him with a walker.  When he had his injury initially he could not move his legs at all.  So he had had a lot of improvement with being able to move his legs and get to the point where he can take a couple steps.  But is not in any shape at this point to be able to get up and around at home.  Patient is being referred for admission for again further evaluation for more rehabilitation.    PT Comments    Patient requires less assistance for sitting up at bedside, able to scoot over to wheelchair without using sliding board, but had difficulty moving over wheelchair cushion requiring assistance.  Patient attempted sit to stand x 2 attempts using RW, able to lift bottom off chair, but unable to lock knees due to weakness.  Patient tolerated staying up in wheelchair to go off floor with spouse assisting - nursing staff aware.  Patient will benefit from continued physical therapy in hospital and recommended venue below  to increase strength, balance, endurance for safe ADLs and gait.   Follow Up Recommendations  SNF;Supervision for mobility/OOB     Equipment Recommendations  None recommended by PT    Recommendations for Other Services       Precautions / Restrictions Precautions Precautions: Fall;Back Precaution Booklet Issued: No Precaution Comments: Back precautions Required Braces or Orthoses: Spinal Brace Knee Immobilizer - Right: On when out of bed or walking Spinal Brace: Thoracolumbosacral orthotic;Applied in sitting position Restrictions Weight Bearing Restrictions: No Other Position/Activity Restrictions: sit upright in chair for spinal protection    Mobility  Bed Mobility Overal bed mobility: Needs Assistance Bed Mobility: Supine to Sit;Rolling Rolling: Min guard   Supine to sit: Min assist     General bed mobility comments: need help to move BLE  Transfers Overall transfer level: Needs assistance Equipment used: None Transfers: Lateral/Scoot Transfers          Lateral/Scoot Transfers: Min assist;Mod assist General transfer comment: able to scoot over to wheelchair without using sliding board  Ambulation/Gait                 Stairs             Wheelchair Mobility    Modified Rankin (Stroke Patients Only)       Balance Overall balance assessment: Needs assistance Sitting-balance support: Feet supported;No upper extremity supported Sitting balance-Leahy Scale: Good  Cognition Arousal/Alertness: Awake/alert Behavior During Therapy: WFL for tasks assessed/performed Overall Cognitive Status: Within Functional Limits for tasks assessed                                        Exercises General Exercises - Lower Extremity Ankle Circles/Pumps: Seated;AROM;Strengthening;Both;10 reps Short Arc Quad: Seated;AROM;Strengthening;10 reps;Both Hip Flexion/Marching:  Seated;AROM;Strengthening;Both;10 reps    General Comments        Pertinent Vitals/Pain Pain Assessment: 0-10 Pain Score: 8  Pain Location: mostly headache Pain Descriptors / Indicators: Aching Pain Intervention(s): Limited activity within patient's tolerance;Monitored during session;RN gave pain meds during session    Home Living                      Prior Function            PT Goals (current goals can now be found in the care plan section) Acute Rehab PT Goals Patient Stated Goal: return to rehab PT Goal Formulation: With patient/family Time For Goal Achievement: 05/26/18 Potential to Achieve Goals: Good Progress towards PT goals: Progressing toward goals    Frequency    Min 4X/week      PT Plan Current plan remains appropriate    Co-evaluation              AM-PAC PT "6 Clicks" Daily Activity  Outcome Measure  Difficulty turning over in bed (including adjusting bedclothes, sheets and blankets)?: A Little Difficulty moving from lying on back to sitting on the side of the bed? : A Little Difficulty sitting down on and standing up from a chair with arms (e.g., wheelchair, bedside commode, etc,.)?: A Lot Help needed moving to and from a bed to chair (including a wheelchair)?: A Lot Help needed walking in hospital room?: Total Help needed climbing 3-5 steps with a railing? : Total 6 Click Score: 12    End of Session Equipment Utilized During Treatment: Gait belt Activity Tolerance: Patient tolerated treatment well;Patient limited by fatigue Patient left: in chair;with call bell/phone within reach;with family/visitor present Nurse Communication: Mobility status PT Visit Diagnosis: Unsteadiness on feet (R26.81);Other abnormalities of gait and mobility (R26.89);Muscle weakness (generalized) (M62.81)     Time: 0102-7253 PT Time Calculation (min) (ACUTE ONLY): 34 min  Charges:  $Therapeutic Exercise: 8-22 mins $Therapeutic Activity: 8-22  mins                    G Codes:       3:27 PM, May 28, 2018 Lonell Grandchild, MPT Physical Therapist with [facility] [phone] office [phone] mobile phone

## 2018-05-11 NOTE — Progress Notes (Signed)
PROGRESS NOTE  Cody Velez WCH:[id number] DOB: 08-14-1946 DOA: 05/08/2018 PCP: Asencion Noble, MD   Brief History:  72 year old male with a history of hypertension, hyperlipidemia, morbid obesity, coronary artery disease, chronic back pain status post spinal stimulator(C-spine) placed 1/30/19presented with increasing mechanical falls at home.  Patient was recently discharged from inpatient rehabilitation at [facility] after a stay from 03/30/2018 through 05/06/2018.  Apparently, the patient was making significant progress at that time.  However since discharge home, the patient has noted increasing lower extremity weakness and increasing falling.  As result, the patient represented to the hospital on 05/08/2018 for further evaluation.  He denies any fevers, chills, chest pain, shortness of breath, nausea, vomiting, diarrhea.  Initial radiographs of his left knee and hip were unremarkable for fracture or dislocation.  The patient has a rather complicated history.  The patient was initially admitted to the hospital in early April 2019.  He was subsequently found to have MSSA bacteremia.  Extensive work-up at that time revealed epidural abscess and T10-11 ostium myelitis and discitis.  Patient underwent surgery with laminectomy, decompression of epidural abscess, and methacrylate screw augmentation on 03/28/2018 performed by Dr. Ellene Route.  The patient was followed by infectious disease.  They recommended cefazolin and rifampin for 8 weeks to be completed on 05/22/2018.  He was subsequently discharged to inpatient rehabilitation on 03/30/2018.   After he was discharged home, the patient complained of worsening leg weakness resulting in increasing falls.  Therefore, the patient returned to the emergency department. Since his admission on 05/08/2018, the patient has developed continued lower extremity weakness and myoclonus.  He has not had any fevers.  As result, repeat CT imaging of his cervical,  thoracic, lumbar spine have been ordered.  These were reviewed with neurosurgery, Dr. Ellene Route.  He does not feel patient needs emergent surgical intervention, but recommended transfer to [facility] for myelogram.  In preparation for the myelogram, the patient was given vitamin K 5 mg IV x 1 and started on a heparin bridge.  Assessment/Plan: Epidural abscess/vertebral osteomyelitis/discitis -Patient had blood cultures growing MSSA previously -Continue cefazolin and rifampin with a tentative stop date of 05/22/2018 -Status post I&D and laminectomy 03/28/2018, Dr. Ellene Route -5/23 ESR--125  Lower extremity weakness/frequent falls -Patient feels that his lower extremity weakness is worse than when he left inpatient rehab -Check ESR--125 -CRP pending -5/22-Repeat CT scan cervical, thoracic, lumbar spine--new endplate erosion P53-61 with a stable to have slight increased paravertebral inflammation.  Mild interval osseous erosion right T10-11 concern for septic arthritis. -Serum B12 794 -TSH 0.867 -5/23--case discussed with neurosurgery--does not feel patient needs emergent surgical intervention, but recommended transfer to [facility] for myelogram -vitamin K reversal and start heparin IV as bridge in preparation for myelogram  Myoclonus -May be due to his opioids versus new spinal pathology versus gabapentin -EEG--pending -CPK--15  Acute DVT right peroneal vein, left soleal vein -Noted on ultrasound 03/31/2018 -5/23--reverse warfarin -IV heparin bridge  Essential hypertension -Continue amlodipine and lisinopril  Chronic pain syndrome -Minimize hypnotic and opioids -Continue gabapentin--decrease dose for renal failure  Depression -Continue home dose of venlafaxine -Holding trazodone in the setting of encephalopathy  Hyperlipidemia -Continue statin  Coronary artery disease -No chest pain presently  Cognitive impairment -Continue Aricept and Namenda     Disposition  Plan:   transfer to [facility] [employer] Family Communication:   Daughter and significant other updated at bedside 5/23--Total time spent 35 minutes.  Greater than  50% spent face to face counseling and coordinating care.   Consultants:  none  Code Status:  FULL  DVT Prophylaxis:  coumadin>>>IV heparin   Procedures: As Listed in Progress Note Above  Antibiotics: Cefazolin rifampin       Subjective: Patient denies fevers, chills, headache, chest pain, dyspnea, nausea, vomiting, diarrhea, abdominal pain, dysuria, hematuria, hematochezia, and melena. He states that his lower extremity weakness is about the same.  His back pain overall has improved but still continues to require oxycodone.  He continues to complain of headache.  Objective: Vitals:   05/10/18 2030 05/10/18 2200 05/11/18 0500 05/11/18 0603  BP:  122/67  (!) 101/52  Pulse:  80  78  Resp:  20  18  Temp:  98.9 F (37.2 C)  98.4 F (36.9 C)  TempSrc:  Oral  Oral  SpO2: 94% 97%  97%  Weight:   123.5 kg (272 lb 4.3 oz)   Height:        Intake/Output Summary (Last 24 hours) at 05/11/2018 1011 Last data filed at 05/11/2018 0616 Gross per 24 hour  Intake 730 ml  Output 900 ml  Net -170 ml   Weight change:  Exam:   General:  Pt is alert, follows commands appropriately, not in acute distress  HEENT: No icterus, No thrush, No neck mass, [place]/AT  Cardiovascular: RRR, S1/S2, no rubs, no gallops  Respiratory: CTA bilaterally, no wheezing, no crackles, no rhonchi  Abdomen: Soft/+BS, non tender, non distended, no guarding  Extremities: No edema, No lymphangitis, No petechiae, No rashes, no synovitis  Neuro:  CN II-XII intact, strength 3-/5 in RLE, strength 3-/5 LLE; sensation intact bilateral; no dysmetria; babinski equivocal     Data Reviewed: I have personally reviewed following labs and imaging studies Basic Metabolic Panel: Recent Labs  Lab 05/08/18 1839 05/09/18 0633 05/10/18 0933 05/11/18 0659    NA 134*  --  135  --   K 4.0  --  3.9  --   CL 100*  --  99*  --   CO2 26  --  29  --   GLUCOSE 116*  --  109*  --   BUN 10  --  9  --   CREATININE 0.79  --  0.74  --   CALCIUM 8.8*  --  8.9  --   MG  --  1.7 1.7 2.0   Liver Function Tests: Recent Labs  Lab 05/08/18 1839  AST 12*  ALT 7*  ALKPHOS 99  BILITOT 0.5  PROT 7.5  ALBUMIN 2.7*   No results for input(s): LIPASE, AMYLASE in the last 168 hours. No results for input(s): AMMONIA in the last 168 hours. Coagulation Profile: Recent Labs  Lab 05/06/18 0411 05/08/18 1839 05/09/18 0633 05/10/18 0625 05/11/18 0659  INR 2.18 2.82 2.93 2.85 2.81   CBC: Recent Labs  Lab 05/06/18 0411 05/08/18 1839 05/10/18 0933 05/11/18 0659  WBC 6.6 12.0* 9.4 8.0  NEUTROABS  --  8.7*  --   --   HGB 9.4* 10.0* 9.4* 9.4*  HCT 33.1* 34.5* 33.0* 33.0*  MCV 83.6 82.5 82.5 83.5  PLT 588* 579* 532* 579*   Cardiac Enzymes: Recent Labs  Lab 05/11/18 0659  CKTOTAL 15*   BNP: Invalid input(s): POCBNP CBG: No results for input(s): GLUCAP in the last 168 hours. HbA1C: No results for input(s): HGBA1C in the last 72 hours. Urine analysis:    Component Value Date/Time   COLORURINE AMBER (A) 04/05/2018 1621   APPEARANCEUR  CLEAR 04/05/2018 1621   LABSPEC 1.029 04/05/2018 1621   PHURINE 5.0 04/05/2018 1621   GLUCOSEU NEGATIVE 04/05/2018 1621   HGBUR NEGATIVE 04/05/2018 1621   BILIRUBINUR MODERATE (A) 04/05/2018 1621   KETONESUR NEGATIVE 04/05/2018 1621   PROTEINUR 30 (A) 04/05/2018 1621   UROBILINOGEN 0.2 03/05/2011 1909   NITRITE NEGATIVE 04/05/2018 1621   LEUKOCYTESUR NEGATIVE 04/05/2018 1621   Sepsis Labs: _0 (procalcitonin:4,lacticidven:4) )No results found for this or any previous visit (from the past 240 hour(s)).   Scheduled Meds: . amLODipine  10 mg Oral Daily  . aspirin  81 mg Oral Daily  . docusate sodium  200 mg Oral BID  . donepezil  10 mg Oral QHS  . finasteride  5 mg Oral Daily  . gabapentin  600 mg  Oral TID  . latanoprost  1 drop Both Eyes QHS  . linaclotide  290 mcg Oral QAC breakfast  . lisinopril  5 mg Oral Daily  . Melatonin  1.5 mg Oral QHS  . memantine  10 mg Oral BID  . methocarbamol  500 mg Oral QID  . potassium chloride SA  20 mEq Oral Daily  . pravastatin  10 mg Oral QPM  . rifampin  300 mg Oral Q12H  . sodium chloride flush  3 mL Intravenous Q12H  . traZODone  100 mg Oral QHS  . venlafaxine XR  150 mg Oral BID  . vitamin B-12  1,000 mcg Oral Daily  . warfarin  7.5 mg Oral ONCE-1800  . Warfarin - Pharmacist Dosing Inpatient   Does not apply q1800   Continuous Infusions: . sodium chloride    .  ceFAZolin (ANCEF) IV Stopped (05/11/18 0119)    Procedures/Studies: Dg Chest 1 View  Result Date: 04/25/2018 CLINICAL DATA:  Possible malfunction of the right-sided PICC line. EXAM: CHEST  1 VIEW COMPARISON:  Chest x-ray dated March 17, 2018 FINDINGS: The right-sided PICC line tip terminates at the junction of the right and left brachiocephalic veins. The lungs are well-expanded. The interstitial markings are increased. There is no alveolar infiltrate or significant pleural effusion. The heart is normal in size. The pulmonary vascularity is not engorged. There is calcification in the wall of the thoracic aorta. The patient has undergone previous mid and distal thoracic spinal fusion. There are nerve stimulator electrodes present which terminate in the lower cervical region. IMPRESSION: COPD. Mild interstitial prominence may reflect pulmonary fibrosis or low-grade interstitial edema. Thoracic aortic atherosclerosis. Electronically Signed   By: Chey Cho  [place] M.D.   On: 04/25/2018 13:52   Dg Lumbar Spine 2-3 Views  Result Date: 05/08/2018 CLINICAL DATA:  Low back pain after a fall. EXAM: LUMBAR SPINE - 2-3 VIEW COMPARISON:  Spot fluoro films from 03/28/2018. FINDINGS: Patient is status post extensive thoracolumbar fusion posteriorly with incomplete visualization of the thoracolumbar  fusion hardware. Image portions of the hardware show no complicating features. Interbody cages are seen at L4-5 and L5-S1. Bones are diffusely demineralized. Although no definite compression deformity is evident, lateral film is limited in utility given the demineralization. IMPRESSION: No evidence for an acute fracture on this limited two-view study. Electronically Signed   By: Misty Stanley M.D.   On: 05/08/2018 16:58   Dg Knee 1-2 Views Left  Result Date: 05/08/2018 CLINICAL DATA:  [place] landing on bilateral knees. EXAM: RIGHT KNEE - 1-2 VIEW; LEFT KNEE - 1-2 VIEW COMPARISON:  MRI of the LEFT knee November 14, 2009 FINDINGS: LEFT: No acute fracture deformity or dislocation. Moderate to severe  tricompartmental joint space narrowing with periarticular sclerosis and marginal spurring. Osteopenia without destructive bony lesions. Small suprapatellar joint effusion. Mild vascular calcifications. RIGHT: No acute fracture deformity dislocation. Moderate tricompartmental joint space narrowing with periarticular sclerosis and marginal spurring. No destructive bony lesions. Osteopenia. Soft tissue planes are nonsuspicious. IMPRESSION: 1. No acute fracture deformity dislocation. Small LEFT suprapatellar joint effusion. 2. Moderate RIGHT and moderate to severe LEFT tricompartmental osteoarthrosis. Electronically Signed   By: Elon Alas M.D.   On: 05/08/2018 16:55   Dg Knee 1-2 Views Right  Result Date: 05/08/2018 CLINICAL DATA:  [place] landing on bilateral knees. EXAM: RIGHT KNEE - 1-2 VIEW; LEFT KNEE - 1-2 VIEW COMPARISON:  MRI of the LEFT knee November 14, 2009 FINDINGS: LEFT: No acute fracture deformity or dislocation. Moderate to severe tricompartmental joint space narrowing with periarticular sclerosis and marginal spurring. Osteopenia without destructive bony lesions. Small suprapatellar joint effusion. Mild vascular calcifications. RIGHT: No acute fracture deformity dislocation. Moderate  tricompartmental joint space narrowing with periarticular sclerosis and marginal spurring. No destructive bony lesions. Osteopenia. Soft tissue planes are nonsuspicious. IMPRESSION: 1. No acute fracture deformity dislocation. Small LEFT suprapatellar joint effusion. 2. Moderate RIGHT and moderate to severe LEFT tricompartmental osteoarthrosis. Electronically Signed   By: Elon Alas M.D.   On: 05/08/2018 16:55   Ct Cervical Spine W Contrast  Result Date: 05/11/2018 CLINICAL DATA:  History of discitis-osteomyelitis with epidural abscess at T10-11. Status post T10-11 decompression and debridement with thoracolumbar fusion on 03/28/2018. Worsening lower extremity weakness. EXAM: CT CERVICAL, THORACIC, AND LUMBAR SPINE WITH CONTRAST TECHNIQUE: Multidetector CT imaging of the cervical, lumbar, and thoracic spine was performed with intravenous contrast. Multiplanar CT image reconstructions were also generated. CONTRAST:  190m ISOVUE-300 IOPAMIDOL (ISOVUE-300) INJECTION 61% COMPARISON:  Cervical spine CT 03/22/2018. Thoracic and lumbar spine CT 03/20/2018. FINDINGS: CT CERVICAL SPINE FINDINGS Alignment: Unchanged cervical spine straightening and trace anterolisthesis of C2 on C3. Skull base and vertebrae: No acute fracture. No interval osseous erosion to suggest infectious discitis-osteomyelitis. Solid C3-C6 ACDF. Soft tissues and spinal canal: Unchanged dorsal column stimulator terminating at the C4 level. No gross epidural fluid collection, however artifact from the stimulator and fusion plate and screws limits assessment. No evidence of significant paraspinal or prevertebral inflammatory changes or fluid collection. Disc levels: Unchanged disc and facet degeneration compared to last month's study. Uncovertebral and facet spurring again result in multilevel neural foraminal stenosis, moderate to severe on the left at C2-3 and on the right at C3-4. Upper chest: Partially visualized right PICC. Aortic  atherosclerosis. Small right pleural effusion CT THORACIC SPINE FINDINGS Alignment: 4 mm anterolisthesis of T10 on T11, new/increased from prior. Vertebrae: There are new erosive endplate changes at TH70-26without disc space collapse. Right T10-11 facet joint widening is unchanged with slightly increased erosion of the right T10 inferior facet. Interval T8-L1 posterior fusion has been performed. Pedicle screws appear well-positioned without evidence of loosening. No acute fracture is identified. Paraspinal and other soft tissues: Paravertebral edema/phlegmonous changes from T9-T12 are greater on the right than on the left and are stable to slightly increased. No organized fluid collection is identified. There is a small right pleural effusion, and there is dependent subsegmental atelectasis in both lungs. A right PICC terminates in the lower SVC. Aortic and coronary artery atherosclerosis is noted. A dorsal column stimulator enters the canal at T3-4. Disc levels: Unchanged severe disc space narrowing and degenerative endplate changes at TV7-8 Interval T10-11 posterior decompression. Limited assessment for an epidural fluid collection  due to streak artifact from posterior spinal instrumentation. No gross epidural collection above the fused segment. CT LUMBAR SPINE FINDINGS Segmentation: 5 lumbar type vertebrae. Alignment: Normal. Vertebrae: Prior L1-L3 posterior fusion, extended in the interim into the thoracic spine to the T8 level as described above. Prior interbody fusion from L1-2 to L5-S1. No evidence of screw loosening. Solid ankylosis at each level. No acute fracture. No osseous erosion to suggest active osteomyelitis. Paraspinal and other soft tissues: No significant paraspinal inflammatory changes or fluid collection to suggest infection. Mild aortoiliac atherosclerosis. Disc levels: Unchanged osseous neural foraminal stenosis at multiple levels, moderate on the left at L5-S1 and milder elsewhere as  previously described. No gross spinal stenosis. IMPRESSION: 1. New endplate erosion at Z00-17 with stable to slightly increased paravertebral inflammation consistent with known discitis-osteomyelitis. Limited assessment for epidural abscess. 2. Mild interval osseous erosion involving the right T10-11 facet joint which may reflect septic arthritis. 3. No findings to suggest interval development of infection elsewhere in the cervical, thoracic, or lumbar spine. 4. Interval posterior thoracolumbar fusion as above. Electronically Signed   By: Logan Bores M.D.   On: 05/11/2018 09:18   Ct Thoracic Spine W Contrast  Result Date: 05/11/2018 CLINICAL DATA:  History of discitis-osteomyelitis with epidural abscess at T10-11. Status post T10-11 decompression and debridement with thoracolumbar fusion on 03/28/2018. Worsening lower extremity weakness. EXAM: CT CERVICAL, THORACIC, AND LUMBAR SPINE WITH CONTRAST TECHNIQUE: Multidetector CT imaging of the cervical, lumbar, and thoracic spine was performed with intravenous contrast. Multiplanar CT image reconstructions were also generated. CONTRAST:  155m ISOVUE-300 IOPAMIDOL (ISOVUE-300) INJECTION 61% COMPARISON:  Cervical spine CT 03/22/2018. Thoracic and lumbar spine CT 03/20/2018. FINDINGS: CT CERVICAL SPINE FINDINGS Alignment: Unchanged cervical spine straightening and trace anterolisthesis of C2 on C3. Skull base and vertebrae: No acute fracture. No interval osseous erosion to suggest infectious discitis-osteomyelitis. Solid C3-C6 ACDF. Soft tissues and spinal canal: Unchanged dorsal column stimulator terminating at the C4 level. No gross epidural fluid collection, however artifact from the stimulator and fusion plate and screws limits assessment. No evidence of significant paraspinal or prevertebral inflammatory changes or fluid collection. Disc levels: Unchanged disc and facet degeneration compared to last month's study. Uncovertebral and facet spurring again result in  multilevel neural foraminal stenosis, moderate to severe on the left at C2-3 and on the right at C3-4. Upper chest: Partially visualized right PICC. Aortic atherosclerosis. Small right pleural effusion CT THORACIC SPINE FINDINGS Alignment: 4 mm anterolisthesis of T10 on T11, new/increased from prior. Vertebrae: There are new erosive endplate changes at TC94-49without disc space collapse. Right T10-11 facet joint widening is unchanged with slightly increased erosion of the right T10 inferior facet. Interval T8-L1 posterior fusion has been performed. Pedicle screws appear well-positioned without evidence of loosening. No acute fracture is identified. Paraspinal and other soft tissues: Paravertebral edema/phlegmonous changes from T9-T12 are greater on the right than on the left and are stable to slightly increased. No organized fluid collection is identified. There is a small right pleural effusion, and there is dependent subsegmental atelectasis in both lungs. A right PICC terminates in the lower SVC. Aortic and coronary artery atherosclerosis is noted. A dorsal column stimulator enters the canal at T3-4. Disc levels: Unchanged severe disc space narrowing and degenerative endplate changes at TQ7-5 Interval T10-11 posterior decompression. Limited assessment for an epidural fluid collection due to streak artifact from posterior spinal instrumentation. No gross epidural collection above the fused segment. CT LUMBAR SPINE FINDINGS Segmentation: 5 lumbar type vertebrae. Alignment:  Normal. Vertebrae: Prior L1-L3 posterior fusion, extended in the interim into the thoracic spine to the T8 level as described above. Prior interbody fusion from L1-2 to L5-S1. No evidence of screw loosening. Solid ankylosis at each level. No acute fracture. No osseous erosion to suggest active osteomyelitis. Paraspinal and other soft tissues: No significant paraspinal inflammatory changes or fluid collection to suggest infection. Mild aortoiliac  atherosclerosis. Disc levels: Unchanged osseous neural foraminal stenosis at multiple levels, moderate on the left at L5-S1 and milder elsewhere as previously described. No gross spinal stenosis. IMPRESSION: 1. New endplate erosion at M38-17 with stable to slightly increased paravertebral inflammation consistent with known discitis-osteomyelitis. Limited assessment for epidural abscess. 2. Mild interval osseous erosion involving the right T10-11 facet joint which may reflect septic arthritis. 3. No findings to suggest interval development of infection elsewhere in the cervical, thoracic, or lumbar spine. 4. Interval posterior thoracolumbar fusion as above. Electronically Signed   By: Logan Bores M.D.   On: 05/11/2018 09:18   Ct Lumbar Spine W Contrast  Result Date: 05/11/2018 CLINICAL DATA:  History of discitis-osteomyelitis with epidural abscess at T10-11. Status post T10-11 decompression and debridement with thoracolumbar fusion on 03/28/2018. Worsening lower extremity weakness. EXAM: CT CERVICAL, THORACIC, AND LUMBAR SPINE WITH CONTRAST TECHNIQUE: Multidetector CT imaging of the cervical, lumbar, and thoracic spine was performed with intravenous contrast. Multiplanar CT image reconstructions were also generated. CONTRAST:  18m ISOVUE-300 IOPAMIDOL (ISOVUE-300) INJECTION 61% COMPARISON:  Cervical spine CT 03/22/2018. Thoracic and lumbar spine CT 03/20/2018. FINDINGS: CT CERVICAL SPINE FINDINGS Alignment: Unchanged cervical spine straightening and trace anterolisthesis of C2 on C3. Skull base and vertebrae: No acute fracture. No interval osseous erosion to suggest infectious discitis-osteomyelitis. Solid C3-C6 ACDF. Soft tissues and spinal canal: Unchanged dorsal column stimulator terminating at the C4 level. No gross epidural fluid collection, however artifact from the stimulator and fusion plate and screws limits assessment. No evidence of significant paraspinal or prevertebral inflammatory changes or  fluid collection. Disc levels: Unchanged disc and facet degeneration compared to last month's study. Uncovertebral and facet spurring again result in multilevel neural foraminal stenosis, moderate to severe on the left at C2-3 and on the right at C3-4. Upper chest: Partially visualized right PICC. Aortic atherosclerosis. Small right pleural effusion CT THORACIC SPINE FINDINGS Alignment: 4 mm anterolisthesis of T10 on T11, new/increased from prior. Vertebrae: There are new erosive endplate changes at TR11-65without disc space collapse. Right T10-11 facet joint widening is unchanged with slightly increased erosion of the right T10 inferior facet. Interval T8-L1 posterior fusion has been performed. Pedicle screws appear well-positioned without evidence of loosening. No acute fracture is identified. Paraspinal and other soft tissues: Paravertebral edema/phlegmonous changes from T9-T12 are greater on the right than on the left and are stable to slightly increased. No organized fluid collection is identified. There is a small right pleural effusion, and there is dependent subsegmental atelectasis in both lungs. A right PICC terminates in the lower SVC. Aortic and coronary artery atherosclerosis is noted. A dorsal column stimulator enters the canal at T3-4. Disc levels: Unchanged severe disc space narrowing and degenerative endplate changes at TB9-0 Interval T10-11 posterior decompression. Limited assessment for an epidural fluid collection due to streak artifact from posterior spinal instrumentation. No gross epidural collection above the fused segment. CT LUMBAR SPINE FINDINGS Segmentation: 5 lumbar type vertebrae. Alignment: Normal. Vertebrae: Prior L1-L3 posterior fusion, extended in the interim into the thoracic spine to the T8 level as described above. Prior interbody fusion from L1-2  to L5-S1. No evidence of screw loosening. Solid ankylosis at each level. No acute fracture. No osseous erosion to suggest active  osteomyelitis. Paraspinal and other soft tissues: No significant paraspinal inflammatory changes or fluid collection to suggest infection. Mild aortoiliac atherosclerosis. Disc levels: Unchanged osseous neural foraminal stenosis at multiple levels, moderate on the left at L5-S1 and milder elsewhere as previously described. No gross spinal stenosis. IMPRESSION: 1. New endplate erosion at S49-67 with stable to slightly increased paravertebral inflammation consistent with known discitis-osteomyelitis. Limited assessment for epidural abscess. 2. Mild interval osseous erosion involving the right T10-11 facet joint which may reflect septic arthritis. 3. No findings to suggest interval development of infection elsewhere in the cervical, thoracic, or lumbar spine. 4. Interval posterior thoracolumbar fusion as above. Electronically Signed   By: Logan Bores M.D.   On: 05/11/2018 09:18   Dg Chest Portable 1 View  Result Date: 05/08/2018 CLINICAL DATA:  Verify PICC line placement. EXAM: PORTABLE CHEST 1 VIEW COMPARISON:  Apr 25, 2018 FINDINGS: A right PICC line is identified. The distal tip is difficult to visualize but appears to terminate in the central SVC. No change in the cardiomediastinal silhouette. No pneumothorax. No other acute interval changes. IMPRESSION: The right PICC line appears to be in good position. The distal tip is difficult to visualize with confidence but appears to be in the central SVC. Electronically Signed   By: Dorise Bullion III M.D   On: 05/08/2018 16:03   Dg Hip Unilat With Pelvis 2-3 Views Right  Result Date: 05/08/2018 CLINICAL DATA:  Fall, right hip pain EXAM: DG HIP (WITH OR WITHOUT PELVIS) 2-3V RIGHT COMPARISON:  None. FINDINGS: No fracture or dislocation is seen. Bilateral hip joint spaces are preserved. Visualized bony pelvis appears intact. Degenerative changes at L5-S1. IMPRESSION: Negative. Electronically Signed   By: Julian Hy M.D.   On: 05/08/2018 16:53   [place] Ekg Site  Rite  Result Date: 04/25/2018 If Site Rite image not attached, placement could not be confirmed due to current cardiac rhythm.   Orson Eva, DO  [facility] Pager [phone]  If 7PM-7AM, please contact night-coverage [email or web address] Password [email or web address] 05/11/2018, 10:11 AM   LOS: 0 days

## 2018-05-11 NOTE — Progress Notes (Signed)
ANTICOAGULATION CONSULT NOTE - Initial Consult  Pharmacy Consult for heparin Indication: bridging for leg DVT after coumadin reversal for myelogram  No Known Allergies  Patient Measurements: Height: '6\' 3"'  (190.5 cm) Weight: 272 lb 4.3 oz (123.5 kg) IBW/kg (Calculated) : 84.5 Heparin Dosing Weight: 111.4 kg  Vital Signs: Temp: 98.7 F (37.1 C) (05/23 1040) Temp Source: Oral (05/23 1040) BP: 113/64 (05/23 1040) Pulse Rate: 83 (05/23 1040)  Labs: Recent Labs    05/08/18 1839 05/09/18 5056 05/10/18 0625 05/10/18 0933 05/11/18 0659  HGB 10.0*  --   --  9.4* 9.4*  HCT 34.5*  --   --  33.0* 33.0*  PLT 579*  --   --  532* 579*  LABPROT 29.4* 30.3* 29.7*  --  29.4*  INR 2.82 2.93 2.85  --  2.81  CREATININE 0.79  --   --  0.74  --   CKTOTAL  --   --   --   --  15*    Estimated Creatinine Clearance: 119.9 mL/min (by C-G formula based on SCr of 0.74 mg/dL).   Medical History: Past Medical History:  Diagnosis Date  . Allergic rhinitis   . Arthritis   . Asthma    as a child  . B12 deficiency   . Cancer [facility])    prostate  . Cervicogenic headache 01/28/2016  . Chronic back pain   . Coronary atherosclerosis of native coronary artery    Mild nonobstructive CAD at catheterization January 2015  . Depression   . Essential hypertension, benign   . Falls   . GERD (gastroesophageal reflux disease)   . History of cerebrovascular disease 07/23/2015  . History of pneumonia 02/2011  . Hyperlipidemia   . Kidney stone   . Memory difficulty 07/23/2015  . OSA (obstructive sleep apnea)    CPAP - Dr. [name]  . Prostate cancer ([place])   . PTSD (post-traumatic stress disorder)    [place]  . Rectal bleeding     Medications:  Medications Prior to Admission  Medication Sig Dispense Refill Last Dose  . amLODipine (NORVASC) 10 MG tablet Take 1 tablet (10 mg total) by mouth daily. 30 tablet 0 05/08/2018 at Unknown time  . aspirin 81 MG tablet Take 81 mg by mouth daily.   05/08/2018 at Unknown  time  . Baclofen 5 MG TABS Take 5 mg by mouth 3 (three) times daily as needed for muscle spasms. 30 tablet 0 05/08/2018 at Unknown time  . ceFAZolin (ANCEF) IVPB Inject 2 g into the vein every 8 (eight) hours for 18 days. Indication:  MSSA Bacteremia and extensive epidural abscess discitis of thoracic spine Last Day of Therapy: 05/22/2018 Labs - Once weekly:  CBC/D and BMP, Labs - Every other week:  ESR and CRP 53 Units 0 05/08/2018 at 800a  . cycloSPORINE (RESTASIS) 0.05 % ophthalmic emulsion 1 DROP INTO BOTH EYES TWICE A DAY FOR DRY EYES   05/08/2018 at Unknown time  . docusate sodium (COLACE) 100 MG capsule Take 200 mg by mouth 2 (two) times daily.   05/08/2018 at Unknown time  . donepezil (ARICEPT) 10 MG tablet Take 1 tablet (10 mg total) by mouth at bedtime. 90 tablet 3 05/07/2018 at Unknown time  . finasteride (PROSCAR) 5 MG tablet Take 1 tablet (5 mg total) by mouth daily. Reported on 05/04/2016 30 tablet 0 05/08/2018 at Unknown time  . fluticasone (FLONASE) 50 MCG/ACT nasal spray Place 1 spray into both nostrils daily as needed for allergies.  Past Week at Unknown time  . gabapentin (NEURONTIN) 300 MG capsule Take 2 capsules (600 mg total) by mouth 3 (three) times daily. 90 capsule 0 05/08/2018 at Unknown time  . latanoprost (XALATAN) 0.005 % ophthalmic solution Place 1 drop into both eyes at bedtime.    05/07/2018 at Unknown time  . Melatonin 3 MG TABS Take 0.5 tablets (1.5 mg total) by mouth at bedtime. 30 tablet 0 05/07/2018 at Unknown time  . memantine (NAMENDA) 10 MG tablet Take 1 tablet (10 mg total) by mouth 2 (two) times daily. 180 tablet 3 05/08/2018 at Unknown time  . methocarbamol (ROBAXIN) 500 MG tablet Take 1 tablet (500 mg total) by mouth 4 (four) times daily. 180 tablet 0 05/08/2018 at Unknown time  . omeprazole (PRILOSEC) 20 MG capsule Take 2 capsules (40 mg total) by mouth daily. 60 capsule 0 05/08/2018 at Unknown time  . oxyCODONE 10 MG TABS Take 1 tablet (10 mg total) by mouth every 4  (four) hours as needed for moderate pain or severe pain. 30 tablet 0 05/08/2018 at [place]  . pravastatin (PRAVACHOL) 10 MG tablet Take 1 tablet (10 mg total) by mouth every evening. 30 tablet 0 05/07/2018 at Unknown time  . rifampin (RIFADIN) 300 MG capsule Continue through 05/22/2018 and stop (Patient taking differently: Take 300 mg by mouth every 12 (twelve) hours. Continue through 05/22/2018 and stop) 60 capsule 0 05/08/2018 at Unknown time  . traZODone (DESYREL) 100 MG tablet Take 1 tablet (100 mg total) by mouth at bedtime. 30 tablet 0 05/07/2018 at Unknown time  . venlafaxine XR (EFFEXOR-XR) 150 MG 24 hr capsule Take 1 capsule (150 mg total) by mouth 2 (two) times daily. 60 capsule 0 05/08/2018 at Unknown time  . vitamin B-12 (CYANOCOBALAMIN) 1000 MCG tablet Take 1 tablet (1,000 mcg total) by mouth daily. 30 tablet 0 05/08/2018 at Unknown time  . warfarin (COUMADIN) 5 MG tablet Take 1.5 tablets (7.26m) on Sunday, Tuesday, Wednesday, Thursday, and Saturday.  Take 2 tablets (118m on Monday and Friday. 60 tablet 1 05/08/2018 at 1000a  . lidocaine (LIDODERM) 5 % Place 1 patch onto the skin daily. Remove & Discard patch within 12 hours or as directed by MD (Patient not taking: Reported on 05/08/2018) 30 patch 0 Not Taking at Unknown time  . linaclotide (LINZESS) 290 MCG CAPS capsule Take 1 capsule (290 mcg total) by mouth daily before breakfast. 30 capsule 0   . lisinopril (PRINIVIL,ZESTRIL) 5 MG tablet Take 1 tablet (5 mg total) by mouth daily. 30 tablet 0   . potassium chloride SA (K-DUR,KLOR-CON) 20 MEQ tablet Take 1 tablet (20 mEq total) by mouth daily. 30 tablet 0     Assessment: Pharmacy consulted to do heparin for patient with bridging for leg DVT after coumadin reversal for myelogram. Vitamin K 5 mg PO given today at 1147. INR is currently therapeutic at 2.81.  Goal of Therapy:  Heparin level 0.3-0.7 units/ml Monitor platelets by anticoagulation protocol: Yes   Plan:  Will recheck INR in 6  hours. Start heparin once INR is below 2  StMargot AblesPharmD Clinical Pharmacist 05/11/2018 12:23 PM

## 2018-05-11 NOTE — Progress Notes (Signed)
EEG Completed; Results Pending  

## 2018-05-11 NOTE — Progress Notes (Signed)
OT Cancellation Note  Patient Details Name: Cody Velez MRN: [id number] DOB: 10-08-1946   Cancelled Treatment:    Reason Eval/Treat Not Completed: Other (comment). Attempted to see pt this am. Pt reporting 8/10 pain in head, back, and legs and was waiting for breakfast. Nsg in room aware of request for pain medication. Will attempt to see pt at a later time today or tomorrow morning.    Guadelupe Sabin, OTR/L  [phone] 05/11/2018, 8:10 AM

## 2018-05-11 NOTE — Procedures (Signed)
Date of recording 05/11/2018  Referring physician Dr. Shanon Brow Tat  Technical  Digital EEG recording using 10-20 International electrode system.  Reason for the study Falls  Description of the recording When awake posterior dominant rhythm is 8 to 9 Hz symmetrical reactive and well sustained. Non-REM stage II sleep was obtained. No localizing, lateralizing, interictal epileptiform features were seen during this recording.  Impression The EEG is normal with patient recording awake and sleep states.

## 2018-05-11 NOTE — Progress Notes (Addendum)
ANTICOAGULATION CONSULT NOTE - initial Consult  Pharmacy Consult for warfarin Indication: DVT  No Known Allergies  Patient Measurements: Height: 6\' 3"  (190.5 cm) Weight: 272 lb 4.3 oz (123.5 kg) IBW/kg (Calculated) : 84.5   Vital Signs: Temp: 98.4 F (36.9 C) (05/23 0603) Temp Source: Oral (05/23 0603) BP: 101/52 (05/23 0603) Pulse Rate: 78 (05/23 0603)  Labs: Recent Labs    05/08/18 1839 05/09/18 3491 05/10/18 0625 05/10/18 0933 05/11/18 0659  HGB 10.0*  --   --  9.4* 9.4*  HCT 34.5*  --   --  33.0* 33.0*  PLT 579*  --   --  532* 579*  LABPROT 29.4* 30.3* 29.7*  --  29.4*  INR 2.82 2.93 2.85  --  2.81  CREATININE 0.79  --   --  0.74  --   CKTOTAL  --   --   --   --  15*    Estimated Creatinine Clearance: 119.9 mL/min (by C-G formula based on SCr of 0.74 mg/dL).   Assessment: 29 YOM  Recently discharged from [facility] with MSSA bacteremia currently being treated with cefazolin and rifampin through 05/22/18 (per ID recs). DVT noted on doppler 4/12, decision to anticoagulate is complicated by rifampin drug interaction with all oral anticoagulants. INR is therapeutic at 2.81 Home regimen is warfarin 10mg  on Mondays and Fridays, 7.5mg  all other days.  Goal of Therapy:  INR 2-3 Monitor platelets by anticoagulation protocol: Yes   Plan:  Warfarin 7.5 mg x 1 dose PT-INR daily Monitor for S/S of bleeding  Margot Ables, PharmD Clinical Pharmacist 05/11/2018 9:02 AM

## 2018-05-12 ENCOUNTER — Inpatient Hospital Stay (HOSPITAL_COMMUNITY): Payer: Medicare Other

## 2018-05-12 ENCOUNTER — Encounter (HOSPITAL_COMMUNITY): Payer: Self-pay | Admitting: General Practice

## 2018-05-12 LAB — HEPARIN LEVEL (UNFRACTIONATED)
Heparin Unfractionated: <0.1 IU/mL — ABNORMAL LOW (ref 0.30–0.70)
Heparin Unfractionated: <0.1 IU/mL — ABNORMAL LOW (ref 0.30–0.70)

## 2018-05-12 LAB — CBC
HCT: 31.8 % — ABNORMAL LOW (ref 39.0–52.0)
Hemoglobin: 9.1 g/dL — ABNORMAL LOW (ref 13.0–17.0)
MCH: 24 pg — ABNORMAL LOW (ref 26.0–34.0)
MCHC: 28.6 g/dL — ABNORMAL LOW (ref 30.0–36.0)
MCV: 83.9 fL (ref 78.0–100.0)
Platelets: 589 10*3/uL — ABNORMAL HIGH (ref 150–400)
RBC: 3.79 MIL/uL — ABNORMAL LOW (ref 4.22–5.81)
RDW: 16.7 % — ABNORMAL HIGH (ref 11.5–15.5)
WBC: 5.7 10*3/uL (ref 4.0–10.5)

## 2018-05-12 LAB — BASIC METABOLIC PANEL
Anion gap: 8 (ref 5–15)
BUN: 12 mg/dL (ref 6–20)
CO2: 27 mmol/L (ref 22–32)
Calcium: 8.7 mg/dL — ABNORMAL LOW (ref 8.9–10.3)
Chloride: 102 mmol/L (ref 101–111)
Creatinine, Ser: 0.64 mg/dL (ref 0.61–1.24)
GFR calc Af Amer: >60 mL/min (ref >60)
GFR calc non Af Amer: >60 mL/min (ref >60)
Glucose, Bld: 77 mg/dL (ref 65–99)
Potassium: 3.8 mmol/L (ref 3.5–5.1)
Sodium: 137 mmol/L (ref 135–145)

## 2018-05-12 LAB — PROTIME-INR
INR: 1.3
Prothrombin Time: 16.1 seconds — ABNORMAL HIGH (ref 11.4–15.2)

## 2018-05-12 MED ORDER — LIDOCAINE HCL (PF) 1 % IJ SOLN: 5.0000 mL | Freq: Once | INTRAMUSCULAR | Status: DC

## 2018-05-12 MED ORDER — GABAPENTIN 400 MG PO CAPS
400.0000 mg | ORAL_CAPSULE | Freq: Three times a day (TID) | ORAL | Status: DC
  Administered 2018-05-12 – 2018-05-22 (×30): 400 mg via ORAL
  Filled 2018-05-12 (×31): qty 1

## 2018-05-12 MED ORDER — HEPARIN BOLUS VIA INFUSION
3000.0000 [IU] | Freq: Once | INTRAVENOUS | Status: AC
  Administered 2018-05-12: 3000 [IU] via INTRAVENOUS
  Filled 2018-05-12: qty 3000

## 2018-05-12 MED ORDER — LIDOCAINE HCL (PF) 1 % IJ SOLN
INTRAMUSCULAR | Status: AC
  Filled 2018-05-12: qty 5

## 2018-05-12 MED ORDER — IOPAMIDOL (ISOVUE-M 300) INJECTION 61%
INTRAMUSCULAR | Status: AC
  Administered 2018-05-12: 15 mL via INTRATHECAL
  Filled 2018-05-12: qty 15

## 2018-05-12 MED ORDER — IOPAMIDOL (ISOVUE-M 300) INJECTION 61%
15.0000 mL | Freq: Once | INTRAMUSCULAR | Status: AC | PRN
  Administered 2018-05-12: 15 mL via INTRATHECAL

## 2018-05-12 NOTE — Progress Notes (Signed)
ANTICOAGULATION CONSULT NOTE   Pharmacy Consult for heparin Indication: bridging for leg DVT after Coumadin reversal for myelogram  No Known Allergies  Patient Measurements: Height: 6\' 3"  (190.5 cm) Weight: 272 lb 4.3 oz (123.5 kg) IBW/kg (Calculated) : 84.5 Heparin Dosing Weight: 111.4 kg  Vital Signs: Temp: 98 F (36.7 C) (05/24 1438) Temp Source: Oral (05/24 1438) BP: 117/81 (05/24 1438) Pulse Rate: 82 (05/24 1438)  Labs: Recent Labs    05/10/18 0933 05/11/18 0659 05/11/18 1953 05/12/18 0522 05/12/18 1900  HGB 9.4* 9.4*  --  9.1*  --   HCT 33.0* 33.0*  --  31.8*  --   PLT 532* 579*  --  589*  --   LABPROT  --  29.4* 18.4* 16.1*  --   INR  --  2.81 1.54 1.30  --   HEPARINUNFRC  --   --   --  <0.10* <0.10*  CREATININE 0.74  --   --  0.64  --   CKTOTAL  --  15*  --   --   --     Estimated Creatinine Clearance: 119.9 mL/min (by C-G formula based on SCr of 0.64 mg/dL).   Assessment: Pharmacy consulted to do heparin for patient with bridging for leg DVT after coumadin reversal for myelogram.  INR today is 1.3.  Heparin level at 1900 was <0.10 IU/mL.  Heparin drip was held for over 2 hours, so level not reflective of current rate.  Goal of Therapy:  Heparin level 0.3-0.7 units/ml Monitor platelets by anticoagulation protocol: Yes   Plan:  Continue heparin at 1900 units/hr Heparin level due at 0100. Will continue to monitor CBC, H/H and heparin levels.     Corinda Gubler 05/12/2018,8:34 PM

## 2018-05-12 NOTE — Progress Notes (Signed)
Patient has home CPAP unit, states does not need assistance with it.  Advised patient to have RT called should he need help with it.

## 2018-05-12 NOTE — Progress Notes (Signed)
ANTICOAGULATION CONSULT NOTE   Pharmacy Consult for heparin Indication: bridging for leg DVT after Coumadin reversal for myelogram  No Known Allergies  Patient Measurements: Height: 6\' 3"  (190.5 cm) Weight: 272 lb 4.3 oz (123.5 kg) IBW/kg (Calculated) : 84.5 Heparin Dosing Weight: 111.4 kg  Vital Signs: Temp: 98.7 F (37.1 C) (05/24 0941) Temp Source: Oral (05/24 0941) BP: 109/67 (05/24 0941) Pulse Rate: 85 (05/24 0941)  Labs: Recent Labs    05/10/18 0933 05/11/18 0659 05/11/18 1953 05/12/18 0522  HGB 9.4* 9.4*  --  9.1*  HCT 33.0* 33.0*  --  31.8*  PLT 532* 579*  --  589*  LABPROT  --  29.4* 18.4* 16.1*  INR  --  2.81 1.54 1.30  HEPARINUNFRC  --   --   --  <0.10*  CREATININE 0.74  --   --  0.64  CKTOTAL  --  15*  --   --     Estimated Creatinine Clearance: 119.9 mL/min (by C-G formula based on SCr of 0.64 mg/dL).   Assessment: Pharmacy consulted to do heparin for patient with bridging for leg DVT after coumadin reversal for myelogram.  INR today is 1.3.  Heparin level at 0522 this am was <0.10 IU/mL.  Goal of Therapy:  Heparin level 0.3-0.7 units/ml Monitor platelets by anticoagulation protocol: Yes   Plan:   bolus heparin dose = 3000 units x1 dose at 1115 Increase heparin rate by  3units/kg  to = 1900 units/hr Heparin level due at 1800 today. Will continue to monitor CBC, H/H and heparin levels.     Despina Pole 05/12/2018,11:05 AM

## 2018-05-12 NOTE — Progress Notes (Signed)
Patient seen and examined, transferred from [facility] by Dr. Carles Collet today.  Patient has been seen by Dr. Carles Collet earlier this morning.  BP 117/81 (BP Location: Left Arm)   Pulse 82   Temp 98 F (36.7 C) (Oral)   Resp 16   Ht 6\' 3"  (1.905 m)   Wt 123.5 kg (272 lb 4.3 oz)   SpO2 100%   BMI 34.03 kg/m   On exam:    General: Alert and oriented x 3, NAD  Cardiovascular: S1 S2 auscultated, no rubs, murmurs or gallops. Regular rate and rhythm. No pedal edema b/l  Respiratory: Clear to auscultation bilaterally, no wheezing, rales or rhonchi  Gastrointestinal: Soft, nontender, nondistended, + bowel sounds  Ext: no pedal edema bilaterally  Neuro: alert and oriented x3,b/l upper extremities 5/5, marked weakness in both lower extremities 2/5  Musculoskeletal: No digital cyanosis, clubbing  Skin: No rashes  Psych: Normal affect and demeanor, alert and oriented x3   A/p Bilateral lower extremity weakness  -CT cervical, thoracic, lumbar spine 5/22 showed new endplate erosion [id number] with a increased paravertebral inflammation, concern for septic arthritis -Myelogram planned today, Coumadin on hold, on IV heparin drip, INR 1.3 -Neurosurgery following, seen by Dr. Ellene Route will follow myelogram and urgent decompression based on results  Will follow   Ripudeep Rai M.D. [facility] 05/12/2018, 3:49 PM  Pager: [phone]

## 2018-05-12 NOTE — Progress Notes (Signed)
Physical Therapy Treatment Patient Details Name: Cody Velez MRN: [id number] DOB: 27-Mar-1946 Today's Date: 05/12/2018    History of Present Illness Cody Velez is a 72 y.o. male with medical history significant of obstructive sleep apnea, hypertension, obesity, chronic back pain with multiple 3 previous surgeries who recently had an epidural abscess causing paraplegia surgically decompressed on March 28, 2018 has been on 8 weeks duration of cefazolin and rifampin until May 22, 2018.  Patient has been in short-term rehab and was just discharged this past Saturday.  He went home and since being home he is fallen several times.  Family is being him back in due to his frequent falls and the need for further rehab versus skilled nursing facility placement.  They are unaware why he was discharged from the rehab center on Saturday but suspect is because he ran out of insurance days but they are not sure.  They report that he is barely able to take a couple of steps without 2-3  people helping him with a walker.  When he had his injury initially he could not move his legs at all.  So he had had a lot of improvement with being able to move his legs and get to the point where he can take a couple steps.  But is not in any shape at this point to be able to get up and around at home.  Patient is being referred for admission for again further evaluation for more rehabilitation.    PT Comments    PT agreeable to exercising as he is not sure when he is going to leave the facility; he thought that he would be gone by now.    Follow Up Recommendations   SNF     Equipment Recommendations       Recommendations for Other Services  OT     Precautions / Restrictions Precautions Precautions: Fall Required Braces or Orthoses: Spinal Brace Spinal Brace: Thoracolumbosacral orthotic Restrictions Weight Bearing Restrictions: No    Mobility    Balance   Cognition Arousal/Alertness:  Awake/alert Behavior During Therapy: WFL for tasks assessed/performed Overall Cognitive Status: Within Functional Limits for tasks assessed                                        Exercises Other Exercises Other Exercises: ab set, glut set; bent knee lift, ab/adduction, IR of LT LE and ressisted ankle dorsiflexion/plantarflexion all x 10 reps         Pertinent Vitals/Pain Pain Score: 5  Pain Location: Rt knee spasms  Pain Descriptors / Indicators: Shooting;Spasm Pain Intervention(s): Limited activity within patient's tolerance              Frequency      5x a week     PT Plan  PT to be seen 5x a week recommend SNF at discharge.           End of Session               Time: 1130-1200 PT Time Calculation (min) (ACUTE ONLY): 30 min  Charges:  $Therapeutic Exercise: 23-37 mins                         Rayetta Humphrey, PT CLT [phone] 05/12/2018, 12:02 PM

## 2018-05-12 NOTE — Plan of Care (Signed)
  Problem: Coping: Goal: Ability to identify and develop effective coping behavior will improve Outcome: Progressing Goal: Ability to interact with others will improve Outcome: Progressing Goal: Demonstration of participation in decision-making regarding own care will improve Outcome: Progressing Goal: Ability to use eye contact when communicating with others will improve Outcome: Progressing   Problem: Health Behavior/Discharge Planning: Goal: Ability to manage health-related needs will improve Outcome: Progressing   Problem: Clinical Measurements: Goal: Ability to maintain clinical measurements within normal limits will improve Outcome: Progressing Goal: Will remain free from infection Outcome: Progressing Goal: Diagnostic test results will improve Outcome: Progressing Goal: Respiratory complications will improve Outcome: Progressing Goal: Cardiovascular complication will be avoided Outcome: Progressing   Problem: Activity: Goal: Risk for activity intolerance will decrease Outcome: Progressing   Problem: Pain Managment: Goal: General experience of comfort will improve Outcome: Progressing   Problem: Safety: Goal: Ability to remain free from injury will improve Outcome: Progressing

## 2018-05-12 NOTE — Progress Notes (Deleted)
PT Cancellation Note  Patient Details Name: Cody Velez MRN: [id number] DOB: 1946/01/24   Cancelled Treatment:     Pt is being transferred to [facility], [place] [phone] 05/12/2018, 11:38 AM

## 2018-05-12 NOTE — Consult Note (Signed)
Reason for Consult: Leg weakness Referring Physician: Dr. Jacobo Forest is an 72 y.o. male.  HPI: Patient had a dorsal column stimulator implanted in 12/2017 for failed ACDF surgery in the past resulting in continuing neck and occipital pain.  In 03/2018, he developed severe mid-back pain and lower extremity weakness with bowel and bladder incontinence and was found to have an epidural abscess at T10-11.  He underwent incision and drainage and subsequently sent to inpatient rehab for 4 weeks.  Upon discharge from rehab, he was essentially wheelchair bound still with only few steps using walker with standby assist.  While at home he had a fall as he was trying to stand up from the toilet and hence brought back to the hospital.  He had developed left leg DVT post-op as well and was on anticoagulation.  This was reversed and he underwent CT myelogram of cervical, thoracic, and lumbar areas which does not show any spinal cord compression or any abscess.    Past Medical History:  Diagnosis Date  . Allergic rhinitis   . Arthritis   . Asthma    as a child  . B12 deficiency   . Cancer [facility])    prostate  . Cervicogenic headache 01/28/2016  . Chronic back pain   . Coronary atherosclerosis of native coronary artery    Mild nonobstructive CAD at catheterization January 2015  . Depression   . Essential hypertension, benign   . Falls   . GERD (gastroesophageal reflux disease)   . History of cerebrovascular disease 07/23/2015  . History of pneumonia 02/2011  . Hyperlipidemia   . Kidney stone   . Memory difficulty 07/23/2015  . OSA (obstructive sleep apnea)    CPAP - Dr. [name]  . Prostate cancer ([place])   . PTSD (post-traumatic stress disorder)    [place]  . Rectal bleeding     Past Surgical History:  Procedure Laterality Date  . APPLICATION OF ROBOTIC ASSISTANCE FOR SPINAL PROCEDURE N/A 03/28/2018   Procedure: APPLICATION OF ROBOTIC ASSISTANCE FOR SPINAL PROCEDURE;  Surgeon: Kristeen Miss, MD;   Location: [place];  Service: Neurosurgery;  Laterality: N/A;  . BACK SURGERY  02/14/12   lumbar OR #7; "today redid L1L2; replaced screws; added bone from hip"  . BILATERAL KNEE ARTHROSCOPY    . COLONOSCOPY  10/15/2008   Dr. [name]: tubular adenoma   . COLONOSCOPY  12/17/2003   [facility]:NOIBBC rectal and colon  . COLONOSCOPY N/A 09/05/2014   Procedure: COLONOSCOPY;  Surgeon: Daneil Dolin, MD;  [facility];  Service: Endoscopy;  Laterality: N/A;  7:30-rescheduled 9/17 to [place] notified pt  . CYSTOSCOPY WITH STENT PLACEMENT Right 01/27/2016   Procedure: CYSTOSCOPY WITH STENT PLACEMENT;  Surgeon: [name], MD;  Location: [facility];  Service: Urology;  Laterality: Right;  . CYSTOSCOPY/RETROGRADE/URETEROSCOPY/STONE EXTRACTION WITH BASKET Right 01/27/2016   Procedure: CYSTOSCOPY, RIGHT RETROGRADE, RIGHT URETEROSCOPY, STONE EXTRACTION ;  Surgeon: [name], MD;  Location: [facility];  Service: Urology;  Laterality: Right;  . ESOPHAGOGASTRODUODENOSCOPY  10/15/2008     Dr Rourk:Schatzki's ring status post dilation and disruption via 75 F Maloney dilator/ otherwise unremarkable esophagus, small hiatal hernia, multiple fundal gland polyps not manipulated, gastritis, negative H.pylori  . ESOPHAGOGASTRODUODENOSCOPY  06/21/02   [facility]:QBVQX sliding hiatal hernia with mild changes of reflux esophagitis limited to gastroesophageal junction.  Noncritical ring at distal esophagus, 3 cm proximal to gastroesophageal junction/Antral gastritis  . [place]   "broke face  playing softball"  . FRACTURE SURGERY     "left wrist; broke it; took spur off"  . HOLMIUM LASER APPLICATION Right 0/12/6008   Procedure: HOLMIUM LASER APPLICATION;  Surgeon: [name], MD;  Location: [facility];  Service: Urology;  Laterality: Right;  . LEFT HEART CATHETERIZATION WITH CORONARY ANGIOGRAM N/A 12/27/2013   Procedure: LEFT HEART CATHETERIZATION WITH CORONARY ANGIOGRAM;  Surgeon: [name],  MD;  Location: [facility] CATH LAB;  Service: Cardiovascular;  Laterality: N/A;  . neck epidural    . POSTERIOR LUMBAR FUSION 4 LEVEL N/A 03/28/2018   Procedure: Thoracic eight -Lumbar two- FIXATION WITH SCREW PLACEMENT, DECOMPRESSION Thoracic ten-Thoracic eleven  FOR OSTEOMYELITIS;  Surgeon: Kristeen Miss, MD;  Location: [place];  Service: Neurosurgery;  Laterality: N/A;  . SHOULDER SURGERY Bilateral   . TEE WITHOUT CARDIOVERSION N/A 03/21/2018   Procedure: TRANSESOPHAGEAL ECHOCARDIOGRAM (TEE) WITH PROPOFOL;  Surgeon: [name], MD;  [facility];  Service: Cardiovascular;  Laterality: N/A;    Family History  Problem Relation Age of Onset  . Emphysema Father   . Heart failure Father   . Lung cancer Father   . CAD Father   . Colon cancer Mother   . Stroke Mother   . Breast cancer Mother   . Stroke Sister   . Heart attack Brother   . Dementia Paternal Uncle   . Emphysema Maternal Grandmother   . Stroke Maternal Grandmother   . Asthma Other        grandson  . Heart disease Paternal Grandfather   . Anesthesia problems Neg Hx   . Hypotension Neg Hx   . Malignant hyperthermia Neg Hx   . Pseudochol deficiency Neg Hx     Social History:  reports that he quit smoking about 59 years ago. His smoking use included cigarettes. He has quit using smokeless tobacco. His smokeless tobacco use included chew. He reports that he drinks alcohol. He reports that he does not use drugs.  Allergies: No Known Allergies  Prior to Admission medications   Medication Sig Start Date End Date Taking? Authorizing Provider  amLODipine (NORVASC) 10 MG tablet Take 1 tablet (10 mg total) by mouth daily. 05/05/18  Yes Angiulli, Lavon Paganini, PA-C  aspirin 81 MG tablet Take 81 mg by mouth daily.   Yes [provider]  Baclofen 5 MG TABS Take 5 mg by mouth 3 (three) times daily as needed for muscle spasms. 05/05/18  Yes Angiulli, Lavon Paganini, PA-C  ceFAZolin (ANCEF) IVPB Inject 2 g into the vein every 8  (eight) hours for 18 days. Indication:  MSSA Bacteremia and extensive epidural abscess discitis of thoracic spine Last Day of Therapy: 05/22/2018 Labs - Once weekly:  CBC/D and BMP, Labs - Every other week:  ESR and CRP 05/05/18 05/23/18 Yes Love, Pamela S, PA-C  cycloSPORINE (RESTASIS) 0.05 % ophthalmic emulsion 1 DROP INTO BOTH EYES TWICE A DAY FOR DRY EYES 12/02/17 12/03/18 Yes [provider]  docusate sodium (COLACE) 100 MG capsule Take 200 mg by mouth 2 (two) times daily.   Yes [provider]  donepezil (ARICEPT) 10 MG tablet Take 1 tablet (10 mg total) by mouth at bedtime. 05/05/18  Yes Angiulli, Lavon Paganini, PA-C  finasteride (PROSCAR) 5 MG tablet Take 1 tablet (5 mg total) by mouth daily. Reported on 05/04/2016 05/05/18  Yes Angiulli, Lavon Paganini, PA-C  fluticasone [facility]) 50 MCG/ACT nasal spray Place 1 spray into both nostrils daily as needed for allergies.    Yes  [provider]  gabapentin (NEURONTIN) 300 MG capsule Take 2 capsules (600 mg total) by mouth 3 (three) times daily. 05/05/18  Yes Angiulli, Lavon Paganini, PA-C  latanoprost (XALATAN) 0.005 % ophthalmic solution Place 1 drop into both eyes at bedtime.  02/12/18  Yes [provider]  Melatonin 3 MG TABS Take 0.5 tablets (1.5 mg total) by mouth at bedtime. 05/05/18  Yes Angiulli, Lavon Paganini, PA-C  memantine (NAMENDA) 10 MG tablet Take 1 tablet (10 mg total) by mouth 2 (two) times daily. 05/05/18  Yes Angiulli, Lavon Paganini, PA-C  methocarbamol (ROBAXIN) 500 MG tablet Take 1 tablet (500 mg total) by mouth 4 (four) times daily. 05/05/18  Yes Angiulli, Lavon Paganini, PA-C  omeprazole (PRILOSEC) 20 MG capsule Take 2 capsules (40 mg total) by mouth daily. 05/05/18  Yes Angiulli, Lavon Paganini, PA-C  oxyCODONE 10 MG TABS Take 1 tablet (10 mg total) by mouth every 4 (four) hours as needed for moderate pain or severe pain. 05/05/18  Yes Angiulli, Lavon Paganini, PA-C  pravastatin (PRAVACHOL) 10 MG tablet Take 1 tablet (10 mg total) by mouth every  evening. 05/05/18  Yes Angiulli, Lavon Paganini, PA-C  rifampin (RIFADIN) 300 MG capsule Continue through 05/22/2018 and stop Patient taking differently: Take 300 mg by mouth every 12 (twelve) hours. Continue through 05/22/2018 and stop 05/05/18  Yes Angiulli, Lavon Paganini, PA-C  traZODone (DESYREL) 100 MG tablet Take 1 tablet (100 mg total) by mouth at bedtime. 05/05/18  Yes Angiulli, Lavon Paganini, PA-C  venlafaxine XR (EFFEXOR-XR) 150 MG 24 hr capsule Take 1 capsule (150 mg total) by mouth 2 (two) times daily. 05/05/18  Yes Angiulli, Lavon Paganini, PA-C  vitamin B-12 (CYANOCOBALAMIN) 1000 MCG tablet Take 1 tablet (1,000 mcg total) by mouth daily. 05/05/18  Yes Angiulli, Lavon Paganini, PA-C  warfarin (COUMADIN) 5 MG tablet Take 1.5 tablets (7.43m) on Sunday, Tuesday, Wednesday, Thursday, and Saturday.  Take 2 tablets (154m on Monday and Friday. 05/06/18  Yes SwMeredith StaggersMD  lidocaine (LIDODERM) 5 % Place 1 patch onto the skin daily. Remove & Discard patch within 12 hours or as directed by MD Patient not taking: Reported on 05/08/2018 05/05/18   Angiulli, DaLavon PaganiniPA-C  linaclotide ([facility]290 MCG CAPS capsule Take 1 capsule (290 mcg total) by mouth daily before breakfast. 05/05/18   Angiulli, DaLavon PaganiniPA-C  lisinopril (PRINIVIL,ZESTRIL) 5 MG tablet Take 1 tablet (5 mg total) by mouth daily. 05/05/18   Angiulli, DaLavon PaganiniPA-C  potassium chloride SA (K-DUR,KLOR-CON) 20 MEQ tablet Take 1 tablet (20 mEq total) by mouth daily. 05/05/18   Angiulli, DaLavon PaganiniPA-C    Medications:  Scheduled: . amLODipine  10 mg Oral Daily  . aspirin  81 mg Oral Daily  . docusate sodium  200 mg Oral BID  . donepezil  10 mg Oral QHS  . finasteride  5 mg Oral Daily  . gabapentin  400 mg Oral TID  . latanoprost  1 drop Both Eyes QHS  . lidocaine (PF)  5 mL Other Once  . linaclotide  290 mcg Oral QAC breakfast  . lisinopril  5 mg Oral Daily  . Melatonin  1.5 mg Oral QHS  . memantine  10 mg Oral BID  . methocarbamol  500 mg Oral QID  .  pantoprazole  40 mg Oral Daily  . potassium chloride SA  20 mEq Oral Daily  . pravastatin  10 mg Oral QPM  . rifampin  300 mg Oral Q12H  .  sodium chloride flush  3 mL Intravenous Q12H  . traZODone  100 mg Oral QHS  . venlafaxine XR  150 mg Oral BID  . vitamin B-12  1,000 mcg Oral Daily  . Warfarin - Pharmacist Dosing Inpatient   Does not apply q1800    Results for orders placed or performed during the hospital encounter of 05/08/18 (from the past 48 hour(s))  Protime-INR     Status: Abnormal   Collection Time: 05/11/18  6:59 AM  Result Value Ref Range   Prothrombin Time 29.4 (H) 11.4 - 15.2 seconds   INR 2.81     Comment: Performed at [facility], [address], [place], [place] [address]  Sedimentation rate     Status: Abnormal   Collection Time: 05/11/18  6:59 AM  Result Value Ref Range   Sed Rate 125 (H) 0 - 16 mm/hr    Comment: Performed at [facility], [address], [place], [place] [address]  C-reactive protein     Status: Abnormal   Collection Time: 05/11/18  6:59 AM  Result Value Ref Range   CRP 22.0 (H) <1.0 mg/dL    Comment: Performed at [facility], [place] [address], [place], [place] [address]  Magnesium     Status: None   Collection Time: 05/11/18  6:59 AM  Result Value Ref Range   Magnesium 2.0 1.7 - 2.4 mg/dL    Comment: Performed at [facility], [address], [place], [place] [address]  CBC     Status: Abnormal   Collection Time: 05/11/18  6:59 AM  Result Value Ref Range   WBC 8.0 4.0 - 10.5 K/uL   RBC 3.95 (L) 4.22 - 5.81 MIL/uL   Hemoglobin 9.4 (L) 13.0 - 17.0 g/dL   HCT 33.0 (L) 39.0 - 52.0 %   MCV 83.5 78.0 - 100.0 fL   MCH 23.8 (L) 26.0 - 34.0 pg   MCHC 28.5 (L) 30.0 - 36.0 g/dL   RDW 16.8 (H) 11.5 - 15.5 %   Platelets 579 (H) 150 - 400 K/uL    Comment: Performed at [facility], [address] [place], [place] [address]  CK     Status: Abnormal   Collection Time: 05/11/18  6:59 AM  Result Value Ref Range   Total CK 15 (L) 49 - 397 U/L     Comment: Performed at [facility], [address] [place], [place] [address]  Protime-INR     Status: Abnormal   Collection Time: 05/11/18  7:53 PM  Result Value Ref Range   Prothrombin Time 18.4 (H) 11.4 - 15.2 seconds   INR 1.54     Comment: Performed at [facility], [address] [place], [place] [address]  Protime-INR     Status: Abnormal   Collection Time: 05/12/18  5:22 AM  Result Value Ref Range   Prothrombin Time 16.1 (H) 11.4 - 15.2 seconds   INR 1.30     Comment: Performed at [facility], [address], [place], [place] [address]  CBC     Status: Abnormal   Collection Time: 05/12/18  5:22 AM  Result Value Ref Range   WBC 5.7 4.0 - 10.5 K/uL   RBC 3.79 (L) 4.22 - 5.81 MIL/uL   Hemoglobin 9.1 (L) 13.0 - 17.0 g/dL   HCT 31.8 (L) 39.0 - 52.0 %   MCV 83.9 78.0 - 100.0 fL   MCH 24.0 (L) 26.0 - 34.0 pg   MCHC 28.6 (L) 30.0 - 36.0  g/dL   RDW 16.7 (H) 11.5 - 15.5 %   Platelets 589 (H) 150 - 400 K/uL    Comment: Performed at [facility], [address], [place], [place] [address]  Basic metabolic panel     Status: Abnormal   Collection Time: 05/12/18  5:22 AM  Result Value Ref Range   Sodium 137 135 - 145 mmol/L   Potassium 3.8 3.5 - 5.1 mmol/L   Chloride 102 101 - 111 mmol/L   CO2 27 22 - 32 mmol/L   Glucose, Bld 77 65 - 99 mg/dL   BUN 12 6 - 20 mg/dL   Creatinine, Ser 0.64 0.61 - 1.24 mg/dL   Calcium 8.7 (L) 8.9 - 10.3 mg/dL   GFR calc non Af Amer >60 >60 mL/min   GFR calc Af Amer >60 >60 mL/min    Comment: (NOTE) The eGFR has been calculated using the CKD EPI equation. This calculation has not been validated in all clinical situations. eGFR's persistently <60 mL/min signify possible Chronic Kidney Disease.    Anion gap 8 5 - 15    Comment: Performed at [facility], [address], [place], [place] [address]  Heparin level (unfractionated)     Status: Abnormal   Collection Time: 05/12/18  5:22 AM  Result Value Ref Range   Heparin Unfractionated <0.10 (L) 0.30  - 0.70 IU/mL    Comment: (NOTE) If heparin results are below expected values, and patient dosage has  been confirmed, suggest follow up testing of antithrombin III levels. Performed at [facility], [address], [place], [place] [address]   Heparin level (unfractionated)     Status: Abnormal   Collection Time: 05/12/18  7:00 PM  Result Value Ref Range   Heparin Unfractionated <0.10 (L) 0.30 - 0.70 IU/mL    Comment: (NOTE) If heparin results are below expected values, and patient dosage has  been confirmed, suggest follow up testing of antithrombin III levels. Performed at [facility], [place] [address] [place], [place] [address]     Ct Cervical Spine Wo Contrast  Result Date: 05/12/2018 CLINICAL DATA:  Worsening paraplegia.  History of spinal infection. FLUOROSCOPY TIME:  1 minutes and 40 seconds.  Fifteen spot films. PROCEDURE: LUMBAR PUNCTURE FOR CERVICAL LUMBAR AND THORACIC MYELOGRAM CERVICAL AND LUMBAR AND THORACIC MYELOGRAM CT CERVICAL MYELOGRAM CT LUMBAR MYELOGRAM CT THORACIC MYELOGRAM After thorough discussion of risks and benefits of the procedure including bleeding, infection, injury to nerves, blood vessels, adjacent structures as well as headache and CSF leak, written and oral informed consent was obtained. Consent was obtained by Dr. Rolla Flatten. Patient was positioned prone on the fluoroscopy table. Local anesthesia was provided with 1% lidocaine without epinephrine after prepped and draped in the usual sterile fashion. Puncture was performed at L2-L3 using a 3 1/2 inch 20 gauge spinal needle via midline approach. Using a single pass through the dura, the needle was placed within the thecal sac, with return of clear CSF. 10 mL Isovue M-300 was injected into the thecal sac, with normal opacification of the nerve roots and cauda equina consistent with free flow within the subarachnoid space. The patient was then moved to the trendelenburg position and contrast flowed into the  Thoracic and Cervical spine regions. I personally performed the lumbar puncture and administered the intrathecal contrast. I also personally supervised acquisition of the myelogram images. TECHNIQUE: Contiguous axial images were obtained through the Cervical, Thoracic, and Lumbar spine after the intrathecal infusion of infusion. Coronal and sagittal  reconstructions were obtained of the axial image sets. FINDINGS: TOTAL MYELOGRAM FINDINGS: Good opacification lumbar subarachnoid space. No stenosis or blockage. Hardware intact. Patient was placed in lateral decubitus and contrast maneuvered into the thoracic region where no block was evident. Patient was further tilted down in contrast maneuvered into the cervical region where no block was present. Dorsal column stimulator leads spanned the cervicothoracic junction. Patient was escorted to CT scanning for further evaluation. CT CERVICAL MYELOGRAM FINDINGS: Alignment: Anatomic except for mild facet mediated slip C2-C3, adjacent segment. Vertebrae: No worrisome osseous lesion.  Solid arthrodesis C3-C6. Cord: Dorsal column stimulator terminating at C4. No cord compression. Paraspinal tissues: Unremarkable. Disc levels: Unchanged disc and facet degeneration compared to last month study. Uncovertebral and facet spurring result in multilevel neural foraminal stenosis moderate to severe on the LEFT at C2-3 and moderate to severe on the RIGHT at C3-4. CT LUMBAR MYELOGRAM FINDINGS: Segmentation: Normal. Alignment:  Normal. Vertebrae: No worrisome osseous lesion.L1-2 S1 fusion appears solid. Conus medullaris: Normal in size and location. Paraspinal tissues: No evidence for hydronephrosis or paravertebral mass. Disc levels: Unchanged osseous neural foraminal stenosis at multiple levels, moderate on the LEFT at L5-S1. No significant spinal stenosis. CT THORACIC MYELOGRAM FINDINGS: Alignment: 4 mm anterolisthesis T10 on T11, new/increased from prior. Vertebrae: Erosive endplate  changes at E70-35, without disc space collapse. T8-L1 fusion posteriorly. Pedicle screws in good position. Paraspinal tissues: Phlegmonous changes from T9 through T12 greater on the RIGHT. Small pleural effusion.Cord: No cord compression at any level. No significant ventral epidural process at T10-11. Cord: No cord compression, cord enlargement, or significant ventral epidural process at any level. Disc levels: Unchanged severe disc space narrowing and degenerative change at T2-T3. Interval T10-T11 posterior decompression. IMPRESSION: Total myelogram with cervical, thoracic and lumbar postmyelogram CT demonstrates no compressive lesion of the cord at any level. Spinal stimulator device, along with multilevel multisegment fusion hardware appear appropriately placed without adverse features. There is probably some residual infection and paravertebral phlegmon at the T10-11 interspace, but there is no ventral epidural hematoma. Electronically Signed   By: Staci Righter M.D.   On: 05/12/2018 17:38   Ct Thoracic Spine Wo Contrast  Result Date: 05/12/2018 CLINICAL DATA:  Worsening paraplegia.  History of spinal infection. FLUOROSCOPY TIME:  1 minutes and 40 seconds.  Fifteen spot films. PROCEDURE: LUMBAR PUNCTURE FOR CERVICAL LUMBAR AND THORACIC MYELOGRAM CERVICAL AND LUMBAR AND THORACIC MYELOGRAM CT CERVICAL MYELOGRAM CT LUMBAR MYELOGRAM CT THORACIC MYELOGRAM After thorough discussion of risks and benefits of the procedure including bleeding, infection, injury to nerves, blood vessels, adjacent structures as well as headache and CSF leak, written and oral informed consent was obtained. Consent was obtained by Dr. Rolla Flatten. Patient was positioned prone on the fluoroscopy table. Local anesthesia was provided with 1% lidocaine without epinephrine after prepped and draped in the usual sterile fashion. Puncture was performed at L2-L3 using a 3 1/2 inch 20 gauge spinal needle via midline approach. Using a single pass  through the dura, the needle was placed within the thecal sac, with return of clear CSF. 10 mL Isovue M-300 was injected into the thecal sac, with normal opacification of the nerve roots and cauda equina consistent with free flow within the subarachnoid space. The patient was then moved to the trendelenburg position and contrast flowed into the Thoracic and Cervical spine regions. I personally performed the lumbar puncture and administered the intrathecal contrast. I also personally supervised acquisition of the myelogram images. TECHNIQUE: Contiguous axial images were obtained  through the Cervical, Thoracic, and Lumbar spine after the intrathecal infusion of infusion. Coronal and sagittal reconstructions were obtained of the axial image sets. FINDINGS: TOTAL MYELOGRAM FINDINGS: Good opacification lumbar subarachnoid space. No stenosis or blockage. Hardware intact. Patient was placed in lateral decubitus and contrast maneuvered into the thoracic region where no block was evident. Patient was further tilted down in contrast maneuvered into the cervical region where no block was present. Dorsal column stimulator leads spanned the cervicothoracic junction. Patient was escorted to CT scanning for further evaluation. CT CERVICAL MYELOGRAM FINDINGS: Alignment: Anatomic except for mild facet mediated slip C2-C3, adjacent segment. Vertebrae: No worrisome osseous lesion.  Solid arthrodesis C3-C6. Cord: Dorsal column stimulator terminating at C4. No cord compression. Paraspinal tissues: Unremarkable. Disc levels: Unchanged disc and facet degeneration compared to last month study. Uncovertebral and facet spurring result in multilevel neural foraminal stenosis moderate to severe on the LEFT at C2-3 and moderate to severe on the RIGHT at C3-4. CT LUMBAR MYELOGRAM FINDINGS: Segmentation: Normal. Alignment:  Normal. Vertebrae: No worrisome osseous lesion.L1-2 S1 fusion appears solid. Conus medullaris: Normal in size and location.  Paraspinal tissues: No evidence for hydronephrosis or paravertebral mass. Disc levels: Unchanged osseous neural foraminal stenosis at multiple levels, moderate on the LEFT at L5-S1. No significant spinal stenosis. CT THORACIC MYELOGRAM FINDINGS: Alignment: 4 mm anterolisthesis T10 on T11, new/increased from prior. Vertebrae: Erosive endplate changes at K99-83, without disc space collapse. T8-L1 fusion posteriorly. Pedicle screws in good position. Paraspinal tissues: Phlegmonous changes from T9 through T12 greater on the RIGHT. Small pleural effusion.Cord: No cord compression at any level. No significant ventral epidural process at T10-11. Cord: No cord compression, cord enlargement, or significant ventral epidural process at any level. Disc levels: Unchanged severe disc space narrowing and degenerative change at T2-T3. Interval T10-T11 posterior decompression. IMPRESSION: Total myelogram with cervical, thoracic and lumbar postmyelogram CT demonstrates no compressive lesion of the cord at any level. Spinal stimulator device, along with multilevel multisegment fusion hardware appear appropriately placed without adverse features. There is probably some residual infection and paravertebral phlegmon at the T10-11 interspace, but there is no ventral epidural hematoma. Electronically Signed   By: Staci Righter M.D.   On: 05/12/2018 17:38   Ct Lumbar Spine Wo Contrast  Result Date: 05/12/2018 CLINICAL DATA:  Worsening paraplegia.  History of spinal infection. FLUOROSCOPY TIME:  1 minutes and 40 seconds.  Fifteen spot films. PROCEDURE: LUMBAR PUNCTURE FOR CERVICAL LUMBAR AND THORACIC MYELOGRAM CERVICAL AND LUMBAR AND THORACIC MYELOGRAM CT CERVICAL MYELOGRAM CT LUMBAR MYELOGRAM CT THORACIC MYELOGRAM After thorough discussion of risks and benefits of the procedure including bleeding, infection, injury to nerves, blood vessels, adjacent structures as well as headache and CSF leak, written and oral informed consent was  obtained. Consent was obtained by Dr. Rolla Flatten. Patient was positioned prone on the fluoroscopy table. Local anesthesia was provided with 1% lidocaine without epinephrine after prepped and draped in the usual sterile fashion. Puncture was performed at L2-L3 using a 3 1/2 inch 20 gauge spinal needle via midline approach. Using a single pass through the dura, the needle was placed within the thecal sac, with return of clear CSF. 10 mL Isovue M-300 was injected into the thecal sac, with normal opacification of the nerve roots and cauda equina consistent with free flow within the subarachnoid space. The patient was then moved to the trendelenburg position and contrast flowed into the Thoracic and Cervical spine regions. I personally performed the lumbar puncture and administered the intrathecal  contrast. I also personally supervised acquisition of the myelogram images. TECHNIQUE: Contiguous axial images were obtained through the Cervical, Thoracic, and Lumbar spine after the intrathecal infusion of infusion. Coronal and sagittal reconstructions were obtained of the axial image sets. FINDINGS: TOTAL MYELOGRAM FINDINGS: Good opacification lumbar subarachnoid space. No stenosis or blockage. Hardware intact. Patient was placed in lateral decubitus and contrast maneuvered into the thoracic region where no block was evident. Patient was further tilted down in contrast maneuvered into the cervical region where no block was present. Dorsal column stimulator leads spanned the cervicothoracic junction. Patient was escorted to CT scanning for further evaluation. CT CERVICAL MYELOGRAM FINDINGS: Alignment: Anatomic except for mild facet mediated slip C2-C3, adjacent segment. Vertebrae: No worrisome osseous lesion.  Solid arthrodesis C3-C6. Cord: Dorsal column stimulator terminating at C4. No cord compression. Paraspinal tissues: Unremarkable. Disc levels: Unchanged disc and facet degeneration compared to last month study.  Uncovertebral and facet spurring result in multilevel neural foraminal stenosis moderate to severe on the LEFT at C2-3 and moderate to severe on the RIGHT at C3-4. CT LUMBAR MYELOGRAM FINDINGS: Segmentation: Normal. Alignment:  Normal. Vertebrae: No worrisome osseous lesion.L1-2 S1 fusion appears solid. Conus medullaris: Normal in size and location. Paraspinal tissues: No evidence for hydronephrosis or paravertebral mass. Disc levels: Unchanged osseous neural foraminal stenosis at multiple levels, moderate on the LEFT at L5-S1. No significant spinal stenosis. CT THORACIC MYELOGRAM FINDINGS: Alignment: 4 mm anterolisthesis T10 on T11, new/increased from prior. Vertebrae: Erosive endplate changes at P80-99, without disc space collapse. T8-L1 fusion posteriorly. Pedicle screws in good position. Paraspinal tissues: Phlegmonous changes from T9 through T12 greater on the RIGHT. Small pleural effusion.Cord: No cord compression at any level. No significant ventral epidural process at T10-11. Cord: No cord compression, cord enlargement, or significant ventral epidural process at any level. Disc levels: Unchanged severe disc space narrowing and degenerative change at T2-T3. Interval T10-T11 posterior decompression. IMPRESSION: Total myelogram with cervical, thoracic and lumbar postmyelogram CT demonstrates no compressive lesion of the cord at any level. Spinal stimulator device, along with multilevel multisegment fusion hardware appear appropriately placed without adverse features. There is probably some residual infection and paravertebral phlegmon at the T10-11 interspace, but there is no ventral epidural hematoma. Electronically Signed   By: Staci Righter M.D.   On: 05/12/2018 17:38   Dg Myelography Lumbar Inj Thoracic  Result Date: 05/12/2018 CLINICAL DATA:  Worsening paraplegia.  History of spinal infection. FLUOROSCOPY TIME:  1 minutes and 40 seconds.  Fifteen spot films. PROCEDURE: LUMBAR PUNCTURE FOR CERVICAL  LUMBAR AND THORACIC MYELOGRAM CERVICAL AND LUMBAR AND THORACIC MYELOGRAM CT CERVICAL MYELOGRAM CT LUMBAR MYELOGRAM CT THORACIC MYELOGRAM After thorough discussion of risks and benefits of the procedure including bleeding, infection, injury to nerves, blood vessels, adjacent structures as well as headache and CSF leak, written and oral informed consent was obtained. Consent was obtained by Dr. Rolla Flatten. Patient was positioned prone on the fluoroscopy table. Local anesthesia was provided with 1% lidocaine without epinephrine after prepped and draped in the usual sterile fashion. Puncture was performed at L2-L3 using a 3 1/2 inch 20 gauge spinal needle via midline approach. Using a single pass through the dura, the needle was placed within the thecal sac, with return of clear CSF. 10 mL Isovue M-300 was injected into the thecal sac, with normal opacification of the nerve roots and cauda equina consistent with free flow within the subarachnoid space. The patient was then moved to the trendelenburg position and contrast flowed into  the Thoracic and Cervical spine regions. I personally performed the lumbar puncture and administered the intrathecal contrast. I also personally supervised acquisition of the myelogram images. TECHNIQUE: Contiguous axial images were obtained through the Cervical, Thoracic, and Lumbar spine after the intrathecal infusion of infusion. Coronal and sagittal reconstructions were obtained of the axial image sets. FINDINGS: TOTAL MYELOGRAM FINDINGS: Good opacification lumbar subarachnoid space. No stenosis or blockage. Hardware intact. Patient was placed in lateral decubitus and contrast maneuvered into the thoracic region where no block was evident. Patient was further tilted down in contrast maneuvered into the cervical region where no block was present. Dorsal column stimulator leads spanned the cervicothoracic junction. Patient was escorted to CT scanning for further evaluation. CT CERVICAL  MYELOGRAM FINDINGS: Alignment: Anatomic except for mild facet mediated slip C2-C3, adjacent segment. Vertebrae: No worrisome osseous lesion.  Solid arthrodesis C3-C6. Cord: Dorsal column stimulator terminating at C4. No cord compression. Paraspinal tissues: Unremarkable. Disc levels: Unchanged disc and facet degeneration compared to last month study. Uncovertebral and facet spurring result in multilevel neural foraminal stenosis moderate to severe on the LEFT at C2-3 and moderate to severe on the RIGHT at C3-4. CT LUMBAR MYELOGRAM FINDINGS: Segmentation: Normal. Alignment:  Normal. Vertebrae: No worrisome osseous lesion.L1-2 S1 fusion appears solid. Conus medullaris: Normal in size and location. Paraspinal tissues: No evidence for hydronephrosis or paravertebral mass. Disc levels: Unchanged osseous neural foraminal stenosis at multiple levels, moderate on the LEFT at L5-S1. No significant spinal stenosis. CT THORACIC MYELOGRAM FINDINGS: Alignment: 4 mm anterolisthesis T10 on T11, new/increased from prior. Vertebrae: Erosive endplate changes at [id number], without disc space collapse. T8-L1 fusion posteriorly. Pedicle screws in good position. Paraspinal tissues: Phlegmonous changes from T9 through T12 greater on the RIGHT. Small pleural effusion.Cord: No cord compression at any level. No significant ventral epidural process at T10-11. Cord: No cord compression, cord enlargement, or significant ventral epidural process at any level. Disc levels: Unchanged severe disc space narrowing and degenerative change at T2-T3. Interval T10-T11 posterior decompression. IMPRESSION: Total myelogram with cervical, thoracic and lumbar postmyelogram CT demonstrates no compressive lesion of the cord at any level. Spinal stimulator device, along with multilevel multisegment fusion hardware appear appropriately placed without adverse features. There is probably some residual infection and paravertebral phlegmon at the T10-11 interspace, but  there is no ventral epidural hematoma. Electronically Signed   By: Staci Righter M.D.   On: 05/12/2018 17:38    ROS Blood pressure 117/81, pulse 82, temperature 98 F (36.7 C), temperature source Oral, resp. rate 16, height '6\' 3"'  (1.905 m), weight 123.5 kg (272 lb 4.3 oz), SpO2 100 %. Neurologic Examination:  Awake, alert, fully oriented. PERL, EOMI, Face symmetrical, Tongue midline. Strength 5/5 BUE, 3/5 BLE in hip flexors, but 5/5 Quads, Hamstrings, TA, Gastroc bilaterally.   No babinski.  No hoffman's. Decreased pinprick in the legs, slightly worse on the right.  No sensory level in the trunk or T10 level. Reflexes +2 ankles, patella, biceps, triceps, brachioradialis. Mildly increased spasticity in the legs.  Gait - unsafe to walk.    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  He is now s/p incision and drainage of the abscess with no evidence of myelopathy on imaging.  His exam and history is consistent with a slowly recovering myelopathy patient.  His status at discharge from rehab indicates that he was not independent at walking or even using a walker  and therefore expected to have high risk for fall.  I don't think there is anything unusual here or a new pathology is present.  His exam is certainly not consistent with GBS either.  He definitely requires more rehab and also home physical therapy and occupational therapy over a much longer period of time to have some improvement over time.  I don't think he needs any further neurological work up.    Rogue Jury, MD 05/12/2018, 9:19 PM

## 2018-05-12 NOTE — Consult Note (Signed)
Reason for Consult: Progressive weakness in the legs Referring Physician: Dr. Garnette Scheuermann  Cody Velez is an 72 y.o. male.  HPI: Cody Velez is a 72 year old individual who was recently released from the rehabilitation center at [facility] on 18 May.  He had developed a spontaneous osteomyelitis and epidural abscess in late March that caused him to fall have significant back pain and then developed progressive weakness I had seen him during the first week of April and did surgical decompression of the epidural abscess and osteomyelitis.  He was started on appropriate antibiotics.  He is making a slow but gradual recovery in terms of strength in the lower extremities to the point that he did have good antigravity strength is starting to walk with the use of the parallel bars he was discharged home this past Saturday and he notes that he had some difficulty maneuvering around in his house from bed to bathroom and such and had a fall on Monday such that his significant other could not mobilize him he was taken to the emergency department at any [facility] where it was noted that he was experiencing progressive weakness but no significant worsening of pain in his back a CT scan was performed on Tuesday I reviewed the study the other day after I was contacted and noted that the patient does have some significant destructive processes at T10-T11 where he had his previous surgery possibly also now extending down to T11 and T12.  Because of the notable worsening in his strength that is that he did not have antigravity strength in either lower extremity I advised that he should undergo a myelogram patient had been on Coumadin for DVTs and this medication was stopped and reversed he is now transferred to [facility] for further evaluation and I have advised that an emergent myelogram be performed on this individual.  Past Medical History:  Diagnosis Date  . Allergic rhinitis   . Arthritis   . Asthma     as a child  . B12 deficiency   . Cancer [facility])    prostate  . Cervicogenic headache 01/28/2016  . Chronic back pain   . Coronary atherosclerosis of native coronary artery    Mild nonobstructive CAD at catheterization January 2015  . Depression   . Essential hypertension, benign   . Falls   . GERD (gastroesophageal reflux disease)   . History of cerebrovascular disease 07/23/2015  . History of pneumonia 02/2011  . Hyperlipidemia   . Kidney stone   . Memory difficulty 07/23/2015  . OSA (obstructive sleep apnea)    CPAP - Dr. [name]  . Prostate cancer ([place])   . PTSD (post-traumatic stress disorder)    [place]  . Rectal bleeding     Past Surgical History:  Procedure Laterality Date  . APPLICATION OF ROBOTIC ASSISTANCE FOR SPINAL PROCEDURE N/A 03/28/2018   Procedure: APPLICATION OF ROBOTIC ASSISTANCE FOR SPINAL PROCEDURE;  Surgeon: Kristeen Miss, MD;  Location: [place];  Service: Neurosurgery;  Laterality: N/A;  . BACK SURGERY  02/14/12   lumbar OR #7; "today redid L1L2; replaced screws; added bone from hip"  . BILATERAL KNEE ARTHROSCOPY    . COLONOSCOPY  10/15/2008   Dr. [name]: tubular adenoma   . COLONOSCOPY  12/17/2003   GDJ:MEQAST rectal and colon  . COLONOSCOPY N/A 09/05/2014   Procedure: COLONOSCOPY;  Surgeon: Daneil Dolin, MD;  [facility];  Service: Endoscopy;  Laterality: N/A;  7:30-rescheduled 9/17 to [place] notified  pt  . CYSTOSCOPY WITH STENT PLACEMENT Right 01/27/2016   Procedure: CYSTOSCOPY WITH STENT PLACEMENT;  Surgeon: [name], MD;  Location: [facility];  Service: Urology;  Laterality: Right;  . CYSTOSCOPY/RETROGRADE/URETEROSCOPY/STONE EXTRACTION WITH BASKET Right 01/27/2016   Procedure: CYSTOSCOPY, RIGHT RETROGRADE, RIGHT URETEROSCOPY, STONE EXTRACTION ;  Surgeon: [name], MD;  Location: [facility];  Service: Urology;  Laterality: Right;  . ESOPHAGOGASTRODUODENOSCOPY  10/15/2008     Dr Rourk:Schatzki's ring status post dilation and disruption  via 86 F Maloney dilator/ otherwise unremarkable esophagus, small hiatal hernia, multiple fundal gland polyps not manipulated, gastritis, negative H.pylori  . ESOPHAGOGASTRODUODENOSCOPY  06/21/02   TWS:FKCLE sliding hiatal hernia with mild changes of reflux esophagitis limited to gastroesophageal junction.  Noncritical ring at distal esophagus, 3 cm proximal to gastroesophageal junction/Antral gastritis  . [place]   "broke face playing softball"  . FRACTURE SURGERY     "left wrist; broke it; took spur off"  . HOLMIUM LASER APPLICATION Right 06/23/1699   Procedure: HOLMIUM LASER APPLICATION;  Surgeon: [name], MD;  Location: [facility];  Service: Urology;  Laterality: Right;  . LEFT HEART CATHETERIZATION WITH CORONARY ANGIOGRAM N/A 12/27/2013   Procedure: LEFT HEART CATHETERIZATION WITH CORONARY ANGIOGRAM;  Surgeon: [name], MD;  Location: [facility] CATH [facility];  Service: Cardiovascular;  Laterality: N/A;  . neck epidural    . POSTERIOR LUMBAR FUSION 4 LEVEL N/A 03/28/2018   Procedure: Thoracic eight -Lumbar two- FIXATION WITH SCREW PLACEMENT, DECOMPRESSION Thoracic ten-Thoracic eleven  FOR OSTEOMYELITIS;  Surgeon: Kristeen Miss, MD;  Location: [place];  Service: Neurosurgery;  Laterality: N/A;  . SHOULDER SURGERY Bilateral   . TEE WITHOUT CARDIOVERSION N/A 03/21/2018   Procedure: TRANSESOPHAGEAL ECHOCARDIOGRAM (TEE) WITH PROPOFOL;  Surgeon: [name], MD;  [facility];  Service: Cardiovascular;  Laterality: N/A;    Family History  Problem Relation Age of Onset  . Emphysema Father   . Heart failure Father   . Lung cancer Father   . CAD Father   . Colon cancer Mother   . Stroke Mother   . Breast cancer Mother   . Stroke Sister   . Heart attack Brother   . Dementia Paternal Uncle   . Emphysema Maternal Grandmother   . Stroke Maternal Grandmother   . Asthma Other        grandson  . Heart disease Paternal Grandfather   . Anesthesia problems Neg Hx    . Hypotension Neg Hx   . Malignant hyperthermia Neg Hx   . Pseudochol deficiency Neg Hx     Social History:  reports that he quit smoking about 59 years ago. His smoking use included cigarettes. His smokeless tobacco use includes chew. He reports that he drinks alcohol. He reports that he does not use drugs.  Allergies: No Known Allergies  Medications: I have reviewed the patient's current medications.  Results for orders placed or performed during the hospital encounter of 05/08/18 (from the past 48 hour(s))  Protime-INR     Status: Abnormal   Collection Time: 05/11/18  6:59 AM  Result Value Ref Range   Prothrombin Time 29.4 (H) 11.4 - 15.2 seconds   INR 2.81     Comment: Performed at [facility], [address], [place], [place] [address]  Sedimentation rate     Status: Abnormal   Collection Time: 05/11/18  6:59 AM  Result Value Ref Range   Sed Rate 125 (H) 0 - 16 mm/hr  Comment: Performed at [facility], [address], [place], [place] [address]  C-reactive protein     Status: Abnormal   Collection Time: 05/11/18  6:59 AM  Result Value Ref Range   CRP 22.0 (H) <1.0 mg/dL    Comment: Performed at [facility] [address] [place], [place] [address]  Magnesium     Status: None   Collection Time: 05/11/18  6:59 AM  Result Value Ref Range   Magnesium 2.0 1.7 - 2.4 mg/dL    Comment: Performed at [facility], [address], [place], [place] [address]  CBC     Status: Abnormal   Collection Time: 05/11/18  6:59 AM  Result Value Ref Range   WBC 8.0 4.0 - 10.5 K/uL   RBC 3.95 (L) 4.22 - 5.81 MIL/uL   Hemoglobin 9.4 (L) 13.0 - 17.0 g/dL   HCT 33.0 (L) 39.0 - 52.0 %   MCV 83.5 78.0 - 100.0 fL   MCH 23.8 (L) 26.0 - 34.0 pg   MCHC 28.5 (L) 30.0 - 36.0 g/dL   RDW 16.8 (H) 11.5 - 15.5 %   Platelets 579 (H) 150 - 400 K/uL    Comment: Performed at [facility], [address], [place], [place] [address]  CK     Status: Abnormal   Collection Time: 05/11/18  6:59 AM   Result Value Ref Range   Total CK 15 (L) 49 - 397 U/L    Comment: Performed at [facility], [address], [place], [place] [address]  Protime-INR     Status: Abnormal   Collection Time: 05/11/18  7:53 PM  Result Value Ref Range   Prothrombin Time 18.4 (H) 11.4 - 15.2 seconds   INR 1.54     Comment: Performed at [facility], [address], [place], [place] [address]  Protime-INR     Status: Abnormal   Collection Time: 05/12/18  5:22 AM  Result Value Ref Range   Prothrombin Time 16.1 (H) 11.4 - 15.2 seconds   INR 1.30     Comment: Performed at [facility], [address] [place], [place] [address]  CBC     Status: Abnormal   Collection Time: 05/12/18  5:22 AM  Result Value Ref Range   WBC 5.7 4.0 - 10.5 K/uL   RBC 3.79 (L) 4.22 - 5.81 MIL/uL   Hemoglobin 9.1 (L) 13.0 - 17.0 g/dL   HCT 31.8 (L) 39.0 - 52.0 %   MCV 83.9 78.0 - 100.0 fL   MCH 24.0 (L) 26.0 - 34.0 pg   MCHC 28.6 (L) 30.0 - 36.0 g/dL   RDW 16.7 (H) 11.5 - 15.5 %   Platelets 589 (H) 150 - 400 K/uL    Comment: Performed at [facility], [address], [place],  [address]  Basic metabolic panel     Status: Abnormal   Collection Time: 05/12/18  5:22 AM  Result Value Ref Range   Sodium 137 135 - 145 mmol/L   Potassium 3.8 3.5 - 5.1 mmol/L   Chloride 102 101 - 111 mmol/L   CO2 27 22 - 32 mmol/L   Glucose, Bld 77 65 - 99 mg/dL   BUN 12 6 - 20 mg/dL   Creatinine, Ser 0.64 0.61 - 1.24 mg/dL   Calcium 8.7 (L) 8.9 - 10.3 mg/dL   GFR calc non Af Amer >60 >60 mL/min   GFR calc Af Amer >60 >60 mL/min    Comment: (NOTE) The eGFR has been calculated  using the CKD EPI equation. This calculation has not been validated in all clinical situations. eGFR's persistently <60 mL/min signify possible Chronic Kidney Disease.    Anion gap 8 5 - 15    Comment: Performed at [facility], [address], [place], [place] [address]  Heparin level (unfractionated)     Status: Abnormal   Collection Time: 05/12/18  5:22 AM   Result Value Ref Range   Heparin Unfractionated <0.10 (L) 0.30 - 0.70 IU/mL    Comment: (NOTE) If heparin results are below expected values, and patient dosage has  been confirmed, suggest follow up testing of antithrombin III levels. Performed at [facility], [address] [place], [place] [address]     Ct Cervical Spine W Contrast  Result Date: 05/11/2018 CLINICAL DATA:  History of discitis-osteomyelitis with epidural abscess at T10-11. Status post T10-11 decompression and debridement with thoracolumbar fusion on 03/28/2018. Worsening lower extremity weakness. EXAM: CT CERVICAL, THORACIC, AND LUMBAR SPINE WITH CONTRAST TECHNIQUE: Multidetector CT imaging of the cervical, lumbar, and thoracic spine was performed with intravenous contrast. Multiplanar CT image reconstructions were also generated. CONTRAST:  131m ISOVUE-300 IOPAMIDOL (ISOVUE-300) INJECTION 61% COMPARISON:  Cervical spine CT 03/22/2018. Thoracic and lumbar spine CT 03/20/2018. FINDINGS: CT CERVICAL SPINE FINDINGS Alignment: Unchanged cervical spine straightening and trace anterolisthesis of C2 on C3. Skull base and vertebrae: No acute fracture. No interval osseous erosion to suggest infectious discitis-osteomyelitis. Solid C3-C6 ACDF. Soft tissues and spinal canal: Unchanged dorsal column stimulator terminating at the C4 level. No gross epidural fluid collection, however artifact from the stimulator and fusion plate and screws limits assessment. No evidence of significant paraspinal or prevertebral inflammatory changes or fluid collection. Disc levels: Unchanged disc and facet degeneration compared to last month's study. Uncovertebral and facet spurring again result in multilevel neural foraminal stenosis, moderate to severe on the left at C2-3 and on the right at C3-4. Upper chest: Partially visualized right PICC. Aortic atherosclerosis. Small right pleural effusion CT THORACIC SPINE FINDINGS Alignment: 4 mm anterolisthesis of T10 on  T11, new/increased from prior. Vertebrae: There are new erosive endplate changes at TH08-65without disc space collapse. Right T10-11 facet joint widening is unchanged with slightly increased erosion of the right T10 inferior facet. Interval T8-L1 posterior fusion has been performed. Pedicle screws appear well-positioned without evidence of loosening. No acute fracture is identified. Paraspinal and other soft tissues: Paravertebral edema/phlegmonous changes from T9-T12 are greater on the right than on the left and are stable to slightly increased. No organized fluid collection is identified. There is a small right pleural effusion, and there is dependent subsegmental atelectasis in both lungs. A right PICC terminates in the lower SVC. Aortic and coronary artery atherosclerosis is noted. A dorsal column stimulator enters the canal at T3-4. Disc levels: Unchanged severe disc space narrowing and degenerative endplate changes at TH8-4 Interval T10-11 posterior decompression. Limited assessment for an epidural fluid collection due to streak artifact from posterior spinal instrumentation. No gross epidural collection above the fused segment. CT LUMBAR SPINE FINDINGS Segmentation: 5 lumbar type vertebrae. Alignment: Normal. Vertebrae: Prior L1-L3 posterior fusion, extended in the interim into the thoracic spine to the T8 level as described above. Prior interbody fusion from L1-2 to L5-S1. No evidence of screw loosening. Solid ankylosis at each level. No acute fracture. No osseous erosion to suggest active osteomyelitis. Paraspinal and other soft tissues: No significant paraspinal inflammatory changes or fluid collection to suggest infection. Mild aortoiliac atherosclerosis. Disc levels: Unchanged osseous neural foraminal stenosis at  multiple levels, moderate on the left at L5-S1 and milder elsewhere as previously described. No gross spinal stenosis. IMPRESSION: 1. New endplate erosion at O03-70 with stable to slightly  increased paravertebral inflammation consistent with known discitis-osteomyelitis. Limited assessment for epidural abscess. 2. Mild interval osseous erosion involving the right T10-11 facet joint which may reflect septic arthritis. 3. No findings to suggest interval development of infection elsewhere in the cervical, thoracic, or lumbar spine. 4. Interval posterior thoracolumbar fusion as above. Electronically Signed   By: Logan Bores M.D.   On: 05/11/2018 09:18   Ct Thoracic Spine W Contrast  Result Date: 05/11/2018 CLINICAL DATA:  History of discitis-osteomyelitis with epidural abscess at T10-11. Status post T10-11 decompression and debridement with thoracolumbar fusion on 03/28/2018. Worsening lower extremity weakness. EXAM: CT CERVICAL, THORACIC, AND LUMBAR SPINE WITH CONTRAST TECHNIQUE: Multidetector CT imaging of the cervical, lumbar, and thoracic spine was performed with intravenous contrast. Multiplanar CT image reconstructions were also generated. CONTRAST:  149m ISOVUE-300 IOPAMIDOL (ISOVUE-300) INJECTION 61% COMPARISON:  Cervical spine CT 03/22/2018. Thoracic and lumbar spine CT 03/20/2018. FINDINGS: CT CERVICAL SPINE FINDINGS Alignment: Unchanged cervical spine straightening and trace anterolisthesis of C2 on C3. Skull base and vertebrae: No acute fracture. No interval osseous erosion to suggest infectious discitis-osteomyelitis. Solid C3-C6 ACDF. Soft tissues and spinal canal: Unchanged dorsal column stimulator terminating at the C4 level. No gross epidural fluid collection, however artifact from the stimulator and fusion plate and screws limits assessment. No evidence of significant paraspinal or prevertebral inflammatory changes or fluid collection. Disc levels: Unchanged disc and facet degeneration compared to last month's study. Uncovertebral and facet spurring again result in multilevel neural foraminal stenosis, moderate to severe on the left at C2-3 and on the right at C3-4. Upper chest:  Partially visualized right PICC. Aortic atherosclerosis. Small right pleural effusion CT THORACIC SPINE FINDINGS Alignment: 4 mm anterolisthesis of T10 on T11, new/increased from prior. Vertebrae: There are new erosive endplate changes at TW88-89without disc space collapse. Right T10-11 facet joint widening is unchanged with slightly increased erosion of the right T10 inferior facet. Interval T8-L1 posterior fusion has been performed. Pedicle screws appear well-positioned without evidence of loosening. No acute fracture is identified. Paraspinal and other soft tissues: Paravertebral edema/phlegmonous changes from T9-T12 are greater on the right than on the left and are stable to slightly increased. No organized fluid collection is identified. There is a small right pleural effusion, and there is dependent subsegmental atelectasis in both lungs. A right PICC terminates in the lower SVC. Aortic and coronary artery atherosclerosis is noted. A dorsal column stimulator enters the canal at T3-4. Disc levels: Unchanged severe disc space narrowing and degenerative endplate changes at TV6-9 Interval T10-11 posterior decompression. Limited assessment for an epidural fluid collection due to streak artifact from posterior spinal instrumentation. No gross epidural collection above the fused segment. CT LUMBAR SPINE FINDINGS Segmentation: 5 lumbar type vertebrae. Alignment: Normal. Vertebrae: Prior L1-L3 posterior fusion, extended in the interim into the thoracic spine to the T8 level as described above. Prior interbody fusion from L1-2 to L5-S1. No evidence of screw loosening. Solid ankylosis at each level. No acute fracture. No osseous erosion to suggest active osteomyelitis. Paraspinal and other soft tissues: No significant paraspinal inflammatory changes or fluid collection to suggest infection. Mild aortoiliac atherosclerosis. Disc levels: Unchanged osseous neural foraminal stenosis at multiple levels, moderate on the left  at L5-S1 and milder elsewhere as previously described. No gross spinal stenosis. IMPRESSION: 1. New endplate erosion at TI50-38  with stable to slightly increased paravertebral inflammation consistent with known discitis-osteomyelitis. Limited assessment for epidural abscess. 2. Mild interval osseous erosion involving the right T10-11 facet joint which may reflect septic arthritis. 3. No findings to suggest interval development of infection elsewhere in the cervical, thoracic, or lumbar spine. 4. Interval posterior thoracolumbar fusion as above. Electronically Signed   By: Logan Bores M.D.   On: 05/11/2018 09:18   Ct Lumbar Spine W Contrast  Result Date: 05/11/2018 CLINICAL DATA:  History of discitis-osteomyelitis with epidural abscess at T10-11. Status post T10-11 decompression and debridement with thoracolumbar fusion on 03/28/2018. Worsening lower extremity weakness. EXAM: CT CERVICAL, THORACIC, AND LUMBAR SPINE WITH CONTRAST TECHNIQUE: Multidetector CT imaging of the cervical, lumbar, and thoracic spine was performed with intravenous contrast. Multiplanar CT image reconstructions were also generated. CONTRAST:  152m ISOVUE-300 IOPAMIDOL (ISOVUE-300) INJECTION 61% COMPARISON:  Cervical spine CT 03/22/2018. Thoracic and lumbar spine CT 03/20/2018. FINDINGS: CT CERVICAL SPINE FINDINGS Alignment: Unchanged cervical spine straightening and trace anterolisthesis of C2 on C3. Skull base and vertebrae: No acute fracture. No interval osseous erosion to suggest infectious discitis-osteomyelitis. Solid C3-C6 ACDF. Soft tissues and spinal canal: Unchanged dorsal column stimulator terminating at the C4 level. No gross epidural fluid collection, however artifact from the stimulator and fusion plate and screws limits assessment. No evidence of significant paraspinal or prevertebral inflammatory changes or fluid collection. Disc levels: Unchanged disc and facet degeneration compared to last month's study. Uncovertebral and  facet spurring again result in multilevel neural foraminal stenosis, moderate to severe on the left at C2-3 and on the right at C3-4. Upper chest: Partially visualized right PICC. Aortic atherosclerosis. Small right pleural effusion CT THORACIC SPINE FINDINGS Alignment: 4 mm anterolisthesis of T10 on T11, new/increased from prior. Vertebrae: There are new erosive endplate changes at TS93-73without disc space collapse. Right T10-11 facet joint widening is unchanged with slightly increased erosion of the right T10 inferior facet. Interval T8-L1 posterior fusion has been performed. Pedicle screws appear well-positioned without evidence of loosening. No acute fracture is identified. Paraspinal and other soft tissues: Paravertebral edema/phlegmonous changes from T9-T12 are greater on the right than on the left and are stable to slightly increased. No organized fluid collection is identified. There is a small right pleural effusion, and there is dependent subsegmental atelectasis in both lungs. A right PICC terminates in the lower SVC. Aortic and coronary artery atherosclerosis is noted. A dorsal column stimulator enters the canal at T3-4. Disc levels: Unchanged severe disc space narrowing and degenerative endplate changes at TS2-8 Interval T10-11 posterior decompression. Limited assessment for an epidural fluid collection due to streak artifact from posterior spinal instrumentation. No gross epidural collection above the fused segment. CT LUMBAR SPINE FINDINGS Segmentation: 5 lumbar type vertebrae. Alignment: Normal. Vertebrae: Prior L1-L3 posterior fusion, extended in the interim into the thoracic spine to the T8 level as described above. Prior interbody fusion from L1-2 to L5-S1. No evidence of screw loosening. Solid ankylosis at each level. No acute fracture. No osseous erosion to suggest active osteomyelitis. Paraspinal and other soft tissues: No significant paraspinal inflammatory changes or fluid collection to  suggest infection. Mild aortoiliac atherosclerosis. Disc levels: Unchanged osseous neural foraminal stenosis at multiple levels, moderate on the left at L5-S1 and milder elsewhere as previously described. No gross spinal stenosis. IMPRESSION: 1. New endplate erosion at TJ68-11with stable to slightly increased paravertebral inflammation consistent with known discitis-osteomyelitis. Limited assessment for epidural abscess. 2. Mild interval osseous erosion involving the right T10-11 facet  joint which may reflect septic arthritis. 3. No findings to suggest interval development of infection elsewhere in the cervical, thoracic, or lumbar spine. 4. Interval posterior thoracolumbar fusion as above. Electronically Signed   By: Logan Bores M.D.   On: 05/11/2018 09:18    Review of Systems  Constitutional: Negative.   HENT: Negative.   Respiratory: Negative.   Cardiovascular: Negative.   Gastrointestinal: Negative.   Genitourinary: Negative.   Musculoskeletal: Positive for back pain.  Skin: Negative.   Neurological:       Weakness of legs  Endo/Heme/Allergies: Negative.   Psychiatric/Behavioral: Negative.    Blood pressure 117/81, pulse 82, temperature 98 F (36.7 C), temperature source Oral, resp. rate 16, height _0  (1.905 m), weight 123.5 kg (272 lb 4.3 oz), SpO2 100 %. Physical Exam  Constitutional: He is oriented to person, place, and time. He appears well-developed and well-nourished.  HENT:  Head: Normocephalic and atraumatic.  Eyes: Pupils are equal, round, and reactive to light. Conjunctivae and EOM are normal.  Neck: Normal range of motion. Neck supple.  Cardiovascular: Normal rate and regular rhythm.  Respiratory: Effort normal and breath sounds normal.  GI: Soft. Bowel sounds are normal.  Musculoskeletal:  Good weakness in the lower extremities with 2 out of 5 strength in iliopsoas in the quadriceps 2 out of 5 strength in the dorsi and plantar flexors.  Neurological: He is alert and  oriented to person, place, and time.  Absent deep tendon reflexes in the lower extremities.  Some clonus in the ankles.  Marked flaccid weakness in the lower extremities with 2 out of 5 strength at best in the iliopsoas and quads no antigravity strength is the patient has had just a few days ago.  Radial nerve examination is normal upper extremity strength is normal  Skin: Skin is warm and dry.  Psychiatric: He has a normal mood and affect. His behavior is normal. Judgment and thought content normal.    Assessment/Plan: Near paralysis of both lower extremities.  I have advised that he needs an emergent myelogram.  Is been on Coumadin and now heparin heparin will be stopped I have discussed with radiology the urgent need for myelography to identify any areas of compression that may require decompression across the thoracolumbar spine.  Ascencion Stegner J 05/12/2018, 3:09 PM

## 2018-05-12 NOTE — Progress Notes (Signed)
OT Cancellation Note  Patient Details Name: Cody Velez MRN: [id number] DOB: November 10, 1946   Cancelled Treatment:    Reason Eval/Treat Not Completed: Other (comment). Spoke with pt and girlfriend this am, pt is awaiting transfer to [facility] this am. Recommend follow-up OT services at next venue of care.   Guadelupe Sabin, OTR/L  [phone] 05/12/2018, 7:55 AM

## 2018-05-12 NOTE — Progress Notes (Addendum)
PROGRESS NOTE  Cody Velez MVH:[id number] DOB: Mar 26, 1946 DOA: 05/08/2018 PCP: Asencion Noble, MD Brief History: 72 year old male with a history of hypertension, hyperlipidemia, morbid obesity, coronary artery disease, chronic back pain status post spinal stimulator(C-spine) placed 1/30/19presented withincreasing mechanical falls at home. Patient was recently discharged from inpatient rehabilitation at [facility] a stay from 03/30/2018 through 05/06/2018. Apparently, the patient was making significant progress at that time. However since discharge home, the patient has noted increasing lower extremity weakness and increasing falling. As result, the patient represented to the hospital on 05/08/2018 for further evaluation. He denies any fevers, chills, chest pain, shortness of breath, nausea, vomiting, diarrhea. Initial radiographs of his left knee and hip were unremarkable for fracture or dislocation.  The patient has a rather complicated history. The patient was initially admitted to the hospital in early April 2019. He was subsequently found to have MSSA bacteremia. Extensive work-up at that time revealed epidural abscess and T10-11 ostium myelitis and discitis. Patient underwent surgery with laminectomy, decompression of epidural abscess, and methacrylate screw augmentation on 03/28/2018 performed by Dr. Ellene Route.The patient was followed by infectious disease. They recommended cefazolin and rifampin for 8 weeks to be completed on 05/22/2018. He was subsequently discharged to inpatient rehabilitation on 03/30/2018.   After he was discharged home, the patient complained of worsening leg weakness resulting in increasing falls.  Therefore, the patient returned to the emergency department. Since his admission on 05/08/2018, the patient has developed continued lower extremity weakness and myoclonus. He has not had any fevers. As result, repeat CT imaging of his cervical, thoracic,  lumbar spine have been ordered.  These were reviewed with neurosurgery, Dr. Ellene Route.  He felt patient needed transfer to [facility] for myelogram to determine further intervention.  In preparation for the myelogram, the patient was given vitamin K 5 mg IV x 1 and started on a heparin bridge.  Assessment/Plan: Epidural abscess/vertebral osteomyelitis/discitis -Patient had blood cultures growing MSSA previously -Continue cefazolin and rifampin with a tentative stop date of 05/22/2018 -Status post I&D and laminectomy 03/28/2018, Dr. Ellene Route -5/23 ESR--125 -5/23 CRP--22.0  Lower extremity weakness/frequent falls -Patient feels that his lower extremity weakness is worse than when he left inpatient rehab -Check ESR--125 -CRP--22.0 -5/22-Repeat CT scan cervical, thoracic, lumbar spine--new endplate erosion [id number] with a stable to have slight increased paravertebral inflammation.  Mild interval osseous erosion right T10-11 concern for septic arthritis. -Serum B12 794 -TSH 0.867 -5/23--case discussed with neurosurgery, Dr. Ellene Route -5/24--case discussed with Dr. Ellene Route again-->transfer to [facility] for myelogram and further evaluation -vitamin K given 5/23 and started heparin IV as bridge in preparation for myelogram -5/24--able to lift legs anti-gravity for first time this admit  Myoclonus -May be due to his opioids versus new spinal pathology versus gabapentin -EEG--no epileptiform activity -CPK--15 -decrease gabapentin dose 600 tid>>>400 tid  Acute DVT right peroneal vein, left soleal vein -Noted on ultrasound 03/31/2018 -5/23--reverse warfarin -IV heparin bridge  Essential hypertension -Continue amlodipine and lisinopril  Chronic pain syndrome -Minimize hypnotic and opioids -Continue gabapentin--decrease dose for renal failure  Depression -Continue home dose of venlafaxine -Holding trazodone in the setting of encephalopathy  Hyperlipidemia -Continue statin  Coronary  artery disease -No chest pain presently  Cognitive impairment -Continue Aricept and Namenda     Disposition Plan:transfer to [facility] Family Communication:Daughter and significant other updatedat bedside 5/24   Consultants:neurosurgery--[place]  Code Status: FULL  DVT Prophylaxis:coumadin>>>IV heparin   Procedures: As Listed  in Progress Note Above  Antibiotics: Cefazolin rifampin      Subjective: He feels that his legs are a tiny bit stronger than yesterday.  He denies any headache, chest pain, nausea, vomiting, shortness of breath, abdominal pain.  He still has some numbness and tingling in his bilateral legs which has not been changed since his surgery in April.  Objective: Vitals:   05/11/18 2017 05/11/18 2244 05/12/18 0400 05/12/18 0452  BP: 125/76   122/76  Pulse: 86   83  Resp: 18   18  Temp: 98.5 F (36.9 C)   98 F (36.7 C)  TempSrc: Oral   Oral  SpO2: 95% 92% 90% 95%  Weight:      Height:        Intake/Output Summary (Last 24 hours) at 05/12/2018 6222 Last data filed at 05/12/2018 0400 Gross per 24 hour  Intake 3135.27 ml  Output 450 ml  Net 2685.27 ml   Weight change:  Exam:   General:  Pt is alert, follows commands appropriately, not in acute distress  HEENT: No icterus, No thrush, No neck mass, [place]/AT  Cardiovascular: RRR, S1/S2, no rubs, no gallops  Respiratory: CTA bilaterally, no wheezing, no crackles, no rhonchi  Abdomen: Soft/+BS, non tender, non distended, no guarding  Extremities: No edema, No lymphangitis, No petechiae, No rashes, no synovitis  Neuro:  CN II-XII intact, strength 3-/5 in RLE, strength 3/5  LLE; sensation intact bilateral; no dysmetria; babinski equivocal     Data Reviewed: I have personally reviewed following labs and imaging studies Basic Metabolic Panel: Recent Labs  Lab 05/08/18 1839 05/09/18 0633 05/10/18 0933 05/11/18 0659 05/12/18 0522  NA 134*  --  135  --  137  K  4.0  --  3.9  --  3.8  CL 100*  --  99*  --  102  CO2 26  --  29  --  27  GLUCOSE 116*  --  109*  --  77  BUN 10  --  9  --  12  CREATININE 0.79  --  0.74  --  0.64  CALCIUM 8.8*  --  8.9  --  8.7*  MG  --  1.7 1.7 2.0  --    Liver Function Tests: Recent Labs  Lab 05/08/18 1839  AST 12*  ALT 7*  ALKPHOS 99  BILITOT 0.5  PROT 7.5  ALBUMIN 2.7*   No results for input(s): LIPASE, AMYLASE in the last 168 hours. No results for input(s): AMMONIA in the last 168 hours. Coagulation Profile: Recent Labs  Lab 05/09/18 0633 05/10/18 0625 05/11/18 0659 05/11/18 1953 05/12/18 0522  INR 2.93 2.85 2.81 1.54 1.30   CBC: Recent Labs  Lab 05/06/18 0411 05/08/18 1839 05/10/18 0933 05/11/18 0659 05/12/18 0522  WBC 6.6 12.0* 9.4 8.0 5.7  NEUTROABS  --  8.7*  --   --   --   HGB 9.4* 10.0* 9.4* 9.4* 9.1*  HCT 33.1* 34.5* 33.0* 33.0* 31.8*  MCV 83.6 82.5 82.5 83.5 83.9  PLT 588* 579* 532* 579* 589*   Cardiac Enzymes: Recent Labs  Lab 05/11/18 0659  CKTOTAL 15*   BNP: Invalid input(s): POCBNP CBG: No results for input(s): GLUCAP in the last 168 hours. HbA1C: No results for input(s): HGBA1C in the last 72 hours. Urine analysis:    Component Value Date/Time   COLORURINE AMBER (A) 04/05/2018 1621   APPEARANCEUR CLEAR 04/05/2018 1621   LABSPEC 1.029 04/05/2018 1621   PHURINE 5.0 04/05/2018 1621  GLUCOSEU NEGATIVE 04/05/2018 1621   HGBUR NEGATIVE 04/05/2018 1621   BILIRUBINUR MODERATE (A) 04/05/2018 1621   KETONESUR NEGATIVE 04/05/2018 1621   PROTEINUR 30 (A) 04/05/2018 1621   UROBILINOGEN 0.2 03/05/2011 1909   NITRITE NEGATIVE 04/05/2018 1621   LEUKOCYTESUR NEGATIVE 04/05/2018 1621   Sepsis Labs: _0 (procalcitonin:4,lacticidven:4) )No results found for this or any previous visit (from the past 240 hour(s)).   Scheduled Meds: . amLODipine  10 mg Oral Daily  . aspirin  81 mg Oral Daily  . docusate sodium  200 mg Oral BID  . donepezil  10 mg Oral QHS  .  finasteride  5 mg Oral Daily  . gabapentin  600 mg Oral TID  . latanoprost  1 drop Both Eyes QHS  . linaclotide  290 mcg Oral QAC breakfast  . lisinopril  5 mg Oral Daily  . Melatonin  1.5 mg Oral QHS  . memantine  10 mg Oral BID  . methocarbamol  500 mg Oral QID  . pantoprazole  40 mg Oral Daily  . potassium chloride SA  20 mEq Oral Daily  . pravastatin  10 mg Oral QPM  . rifampin  300 mg Oral Q12H  . sodium chloride flush  3 mL Intravenous Q12H  . traZODone  100 mg Oral QHS  . venlafaxine XR  150 mg Oral BID  . vitamin B-12  1,000 mcg Oral Daily  . Warfarin - Pharmacist Dosing Inpatient   Does not apply q1800   Continuous Infusions: . sodium chloride    .  ceFAZolin (ANCEF) IV Stopped (05/12/18 0105)  . heparin 1,600 Units/hr (05/12/18 1194)    Procedures/Studies: Dg Chest 1 View  Result Date: 04/25/2018 CLINICAL DATA:  Possible malfunction of the right-sided PICC line. EXAM: CHEST  1 VIEW COMPARISON:  Chest x-ray dated March 17, 2018 FINDINGS: The right-sided PICC line tip terminates at the junction of the right and left brachiocephalic veins. The lungs are well-expanded. The interstitial markings are increased. There is no alveolar infiltrate or significant pleural effusion. The heart is normal in size. The pulmonary vascularity is not engorged. There is calcification in the wall of the thoracic aorta. The patient has undergone previous mid and distal thoracic spinal fusion. There are nerve stimulator electrodes present which terminate in the lower cervical region. IMPRESSION: COPD. Mild interstitial prominence may reflect pulmonary fibrosis or low-grade interstitial edema. Thoracic aortic atherosclerosis. Electronically Signed   By:   [place] M.D.   On: 04/25/2018 13:52   Dg Lumbar Spine 2-3 Views  Result Date: 05/08/2018 CLINICAL DATA:  Low back pain after a fall. EXAM: LUMBAR SPINE - 2-3 VIEW COMPARISON:  Spot fluoro films from 03/28/2018. FINDINGS: Patient is status post  extensive thoracolumbar fusion posteriorly with incomplete visualization of the thoracolumbar fusion hardware. Image portions of the hardware show no complicating features. Interbody cages are seen at L4-5 and L5-S1. Bones are diffusely demineralized. Although no definite compression deformity is evident, lateral film is limited in utility given the demineralization. IMPRESSION: No evidence for an acute fracture on this limited two-view study. Electronically Signed   By: Misty Stanley M.D.   On: 05/08/2018 16:58   Dg Knee 1-2 Views Left  Result Date: 05/08/2018 CLINICAL DATA:  [place] landing on bilateral knees. EXAM: RIGHT KNEE - 1-2 VIEW; LEFT KNEE - 1-2 VIEW COMPARISON:  MRI of the LEFT knee November 14, 2009 FINDINGS: LEFT: No acute fracture deformity or dislocation. Moderate to severe tricompartmental joint space narrowing with periarticular sclerosis and marginal spurring.  Osteopenia without destructive bony lesions. Small suprapatellar joint effusion. Mild vascular calcifications. RIGHT: No acute fracture deformity dislocation. Moderate tricompartmental joint space narrowing with periarticular sclerosis and marginal spurring. No destructive bony lesions. Osteopenia. Soft tissue planes are nonsuspicious. IMPRESSION: 1. No acute fracture deformity dislocation. Small LEFT suprapatellar joint effusion. 2. Moderate RIGHT and moderate to severe LEFT tricompartmental osteoarthrosis. Electronically Signed   By: Elon Alas M.D.   On: 05/08/2018 16:55   Dg Knee 1-2 Views Right  Result Date: 05/08/2018 CLINICAL DATA:  [place] landing on bilateral knees. EXAM: RIGHT KNEE - 1-2 VIEW; LEFT KNEE - 1-2 VIEW COMPARISON:  MRI of the LEFT knee November 14, 2009 FINDINGS: LEFT: No acute fracture deformity or dislocation. Moderate to severe tricompartmental joint space narrowing with periarticular sclerosis and marginal spurring. Osteopenia without destructive bony lesions. Small suprapatellar joint  effusion. Mild vascular calcifications. RIGHT: No acute fracture deformity dislocation. Moderate tricompartmental joint space narrowing with periarticular sclerosis and marginal spurring. No destructive bony lesions. Osteopenia. Soft tissue planes are nonsuspicious. IMPRESSION: 1. No acute fracture deformity dislocation. Small LEFT suprapatellar joint effusion. 2. Moderate RIGHT and moderate to severe LEFT tricompartmental osteoarthrosis. Electronically Signed   By: Elon Alas M.D.   On: 05/08/2018 16:55   Ct Cervical Spine W Contrast  Result Date: 05/11/2018 CLINICAL DATA:  History of discitis-osteomyelitis with epidural abscess at T10-11. Status post T10-11 decompression and debridement with thoracolumbar fusion on 03/28/2018. Worsening lower extremity weakness. EXAM: CT CERVICAL, THORACIC, AND LUMBAR SPINE WITH CONTRAST TECHNIQUE: Multidetector CT imaging of the cervical, lumbar, and thoracic spine was performed with intravenous contrast. Multiplanar CT image reconstructions were also generated. CONTRAST:  170m ISOVUE-300 IOPAMIDOL (ISOVUE-300) INJECTION 61% COMPARISON:  Cervical spine CT 03/22/2018. Thoracic and lumbar spine CT 03/20/2018. FINDINGS: CT CERVICAL SPINE FINDINGS Alignment: Unchanged cervical spine straightening and trace anterolisthesis of C2 on C3. Skull base and vertebrae: No acute fracture. No interval osseous erosion to suggest infectious discitis-osteomyelitis. Solid C3-C6 ACDF. Soft tissues and spinal canal: Unchanged dorsal column stimulator terminating at the C4 level. No gross epidural fluid collection, however artifact from the stimulator and fusion plate and screws limits assessment. No evidence of significant paraspinal or prevertebral inflammatory changes or fluid collection. Disc levels: Unchanged disc and facet degeneration compared to last month's study. Uncovertebral and facet spurring again result in multilevel neural foraminal stenosis, moderate to severe on the left  at C2-3 and on the right at C3-4. Upper chest: Partially visualized right PICC. Aortic atherosclerosis. Small right pleural effusion CT THORACIC SPINE FINDINGS Alignment: 4 mm anterolisthesis of T10 on T11, new/increased from prior. Vertebrae: There are new erosive endplate changes at TM19-62without disc space collapse. Right T10-11 facet joint widening is unchanged with slightly increased erosion of the right T10 inferior facet. Interval T8-L1 posterior fusion has been performed. Pedicle screws appear well-positioned without evidence of loosening. No acute fracture is identified. Paraspinal and other soft tissues: Paravertebral edema/phlegmonous changes from T9-T12 are greater on the right than on the left and are stable to slightly increased. No organized fluid collection is identified. There is a small right pleural effusion, and there is dependent subsegmental atelectasis in both lungs. A right PICC terminates in the lower SVC. Aortic and coronary artery atherosclerosis is noted. A dorsal column stimulator enters the canal at T3-4. Disc levels: Unchanged severe disc space narrowing and degenerative endplate changes at TI2-9 Interval T10-11 posterior decompression. Limited assessment for an epidural fluid collection due to streak artifact from posterior spinal instrumentation. No gross  epidural collection above the fused segment. CT LUMBAR SPINE FINDINGS Segmentation: 5 lumbar type vertebrae. Alignment: Normal. Vertebrae: Prior L1-L3 posterior fusion, extended in the interim into the thoracic spine to the T8 level as described above. Prior interbody fusion from L1-2 to L5-S1. No evidence of screw loosening. Solid ankylosis at each level. No acute fracture. No osseous erosion to suggest active osteomyelitis. Paraspinal and other soft tissues: No significant paraspinal inflammatory changes or fluid collection to suggest infection. Mild aortoiliac atherosclerosis. Disc levels: Unchanged osseous neural foraminal  stenosis at multiple levels, moderate on the left at L5-S1 and milder elsewhere as previously described. No gross spinal stenosis. IMPRESSION: 1. New endplate erosion at X90-24 with stable to slightly increased paravertebral inflammation consistent with known discitis-osteomyelitis. Limited assessment for epidural abscess. 2. Mild interval osseous erosion involving the right T10-11 facet joint which may reflect septic arthritis. 3. No findings to suggest interval development of infection elsewhere in the cervical, thoracic, or lumbar spine. 4. Interval posterior thoracolumbar fusion as above. Electronically Signed   By: Logan Bores M.D.   On: 05/11/2018 09:18   Ct Thoracic Spine W Contrast  Result Date: 05/11/2018 CLINICAL DATA:  History of discitis-osteomyelitis with epidural abscess at T10-11. Status post T10-11 decompression and debridement with thoracolumbar fusion on 03/28/2018. Worsening lower extremity weakness. EXAM: CT CERVICAL, THORACIC, AND LUMBAR SPINE WITH CONTRAST TECHNIQUE: Multidetector CT imaging of the cervical, lumbar, and thoracic spine was performed with intravenous contrast. Multiplanar CT image reconstructions were also generated. CONTRAST:  140m ISOVUE-300 IOPAMIDOL (ISOVUE-300) INJECTION 61% COMPARISON:  Cervical spine CT 03/22/2018. Thoracic and lumbar spine CT 03/20/2018. FINDINGS: CT CERVICAL SPINE FINDINGS Alignment: Unchanged cervical spine straightening and trace anterolisthesis of C2 on C3. Skull base and vertebrae: No acute fracture. No interval osseous erosion to suggest infectious discitis-osteomyelitis. Solid C3-C6 ACDF. Soft tissues and spinal canal: Unchanged dorsal column stimulator terminating at the C4 level. No gross epidural fluid collection, however artifact from the stimulator and fusion plate and screws limits assessment. No evidence of significant paraspinal or prevertebral inflammatory changes or fluid collection. Disc levels: Unchanged disc and facet  degeneration compared to last month's study. Uncovertebral and facet spurring again result in multilevel neural foraminal stenosis, moderate to severe on the left at C2-3 and on the right at C3-4. Upper chest: Partially visualized right PICC. Aortic atherosclerosis. Small right pleural effusion CT THORACIC SPINE FINDINGS Alignment: 4 mm anterolisthesis of T10 on T11, new/increased from prior. Vertebrae: There are new erosive endplate changes at TO97-35without disc space collapse. Right T10-11 facet joint widening is unchanged with slightly increased erosion of the right T10 inferior facet. Interval T8-L1 posterior fusion has been performed. Pedicle screws appear well-positioned without evidence of loosening. No acute fracture is identified. Paraspinal and other soft tissues: Paravertebral edema/phlegmonous changes from T9-T12 are greater on the right than on the left and are stable to slightly increased. No organized fluid collection is identified. There is a small right pleural effusion, and there is dependent subsegmental atelectasis in both lungs. A right PICC terminates in the lower SVC. Aortic and coronary artery atherosclerosis is noted. A dorsal column stimulator enters the canal at T3-4. Disc levels: Unchanged severe disc space narrowing and degenerative endplate changes at TH2-9 Interval T10-11 posterior decompression. Limited assessment for an epidural fluid collection due to streak artifact from posterior spinal instrumentation. No gross epidural collection above the fused segment. CT LUMBAR SPINE FINDINGS Segmentation: 5 lumbar type vertebrae. Alignment: Normal. Vertebrae: Prior L1-L3 posterior fusion, extended in the interim  into the thoracic spine to the T8 level as described above. Prior interbody fusion from L1-2 to L5-S1. No evidence of screw loosening. Solid ankylosis at each level. No acute fracture. No osseous erosion to suggest active osteomyelitis. Paraspinal and other soft tissues: No  significant paraspinal inflammatory changes or fluid collection to suggest infection. Mild aortoiliac atherosclerosis. Disc levels: Unchanged osseous neural foraminal stenosis at multiple levels, moderate on the left at L5-S1 and milder elsewhere as previously described. No gross spinal stenosis. IMPRESSION: 1. New endplate erosion at I34-74 with stable to slightly increased paravertebral inflammation consistent with known discitis-osteomyelitis. Limited assessment for epidural abscess. 2. Mild interval osseous erosion involving the right T10-11 facet joint which may reflect septic arthritis. 3. No findings to suggest interval development of infection elsewhere in the cervical, thoracic, or lumbar spine. 4. Interval posterior thoracolumbar fusion as above. Electronically Signed   By: Logan Bores M.D.   On: 05/11/2018 09:18   Ct Lumbar Spine W Contrast  Result Date: 05/11/2018 CLINICAL DATA:  History of discitis-osteomyelitis with epidural abscess at T10-11. Status post T10-11 decompression and debridement with thoracolumbar fusion on 03/28/2018. Worsening lower extremity weakness. EXAM: CT CERVICAL, THORACIC, AND LUMBAR SPINE WITH CONTRAST TECHNIQUE: Multidetector CT imaging of the cervical, lumbar, and thoracic spine was performed with intravenous contrast. Multiplanar CT image reconstructions were also generated. CONTRAST:  143m ISOVUE-300 IOPAMIDOL (ISOVUE-300) INJECTION 61% COMPARISON:  Cervical spine CT 03/22/2018. Thoracic and lumbar spine CT 03/20/2018. FINDINGS: CT CERVICAL SPINE FINDINGS Alignment: Unchanged cervical spine straightening and trace anterolisthesis of C2 on C3. Skull base and vertebrae: No acute fracture. No interval osseous erosion to suggest infectious discitis-osteomyelitis. Solid C3-C6 ACDF. Soft tissues and spinal canal: Unchanged dorsal column stimulator terminating at the C4 level. No gross epidural fluid collection, however artifact from the stimulator and fusion plate and screws  limits assessment. No evidence of significant paraspinal or prevertebral inflammatory changes or fluid collection. Disc levels: Unchanged disc and facet degeneration compared to last month's study. Uncovertebral and facet spurring again result in multilevel neural foraminal stenosis, moderate to severe on the left at C2-3 and on the right at C3-4. Upper chest: Partially visualized right PICC. Aortic atherosclerosis. Small right pleural effusion CT THORACIC SPINE FINDINGS Alignment: 4 mm anterolisthesis of T10 on T11, new/increased from prior. Vertebrae: There are new erosive endplate changes at TQ59-56without disc space collapse. Right T10-11 facet joint widening is unchanged with slightly increased erosion of the right T10 inferior facet. Interval T8-L1 posterior fusion has been performed. Pedicle screws appear well-positioned without evidence of loosening. No acute fracture is identified. Paraspinal and other soft tissues: Paravertebral edema/phlegmonous changes from T9-T12 are greater on the right than on the left and are stable to slightly increased. No organized fluid collection is identified. There is a small right pleural effusion, and there is dependent subsegmental atelectasis in both lungs. A right PICC terminates in the lower SVC. Aortic and coronary artery atherosclerosis is noted. A dorsal column stimulator enters the canal at T3-4. Disc levels: Unchanged severe disc space narrowing and degenerative endplate changes at TL8-7 Interval T10-11 posterior decompression. Limited assessment for an epidural fluid collection due to streak artifact from posterior spinal instrumentation. No gross epidural collection above the fused segment. CT LUMBAR SPINE FINDINGS Segmentation: 5 lumbar type vertebrae. Alignment: Normal. Vertebrae: Prior L1-L3 posterior fusion, extended in the interim into the thoracic spine to the T8 level as described above. Prior interbody fusion from L1-2 to L5-S1. No evidence of screw  loosening. Solid ankylosis  at each level. No acute fracture. No osseous erosion to suggest active osteomyelitis. Paraspinal and other soft tissues: No significant paraspinal inflammatory changes or fluid collection to suggest infection. Mild aortoiliac atherosclerosis. Disc levels: Unchanged osseous neural foraminal stenosis at multiple levels, moderate on the left at L5-S1 and milder elsewhere as previously described. No gross spinal stenosis. IMPRESSION: 1. New endplate erosion at H88-50 with stable to slightly increased paravertebral inflammation consistent with known discitis-osteomyelitis. Limited assessment for epidural abscess. 2. Mild interval osseous erosion involving the right T10-11 facet joint which may reflect septic arthritis. 3. No findings to suggest interval development of infection elsewhere in the cervical, thoracic, or lumbar spine. 4. Interval posterior thoracolumbar fusion as above. Electronically Signed   By: Logan Bores M.D.   On: 05/11/2018 09:18   Dg Chest Portable 1 View  Result Date: 05/08/2018 CLINICAL DATA:  Verify PICC line placement. EXAM: PORTABLE CHEST 1 VIEW COMPARISON:  Apr 25, 2018 FINDINGS: A right PICC line is identified. The distal tip is difficult to visualize but appears to terminate in the central SVC. No change in the cardiomediastinal silhouette. No pneumothorax. No other acute interval changes. IMPRESSION: The right PICC line appears to be in good position. The distal tip is difficult to visualize with confidence but appears to be in the central SVC. Electronically Signed   By: Dorise Bullion III M.D   On: 05/08/2018 16:03   Dg Hip Unilat With Pelvis 2-3 Views Right  Result Date: 05/08/2018 CLINICAL DATA:  Fall, right hip pain EXAM: DG HIP (WITH OR WITHOUT PELVIS) 2-3V RIGHT COMPARISON:  None. FINDINGS: No fracture or dislocation is seen. Bilateral hip joint spaces are preserved. Visualized bony pelvis appears intact. Degenerative changes at L5-S1. IMPRESSION:  Negative. Electronically Signed   By: Julian Hy M.D.   On: 05/08/2018 16:53   [place] Ekg Site Rite  Result Date: 04/25/2018 If Site Rite image not attached, placement could not be confirmed due to current cardiac rhythm.   Orson Eva, DO  [facility] Pager [phone]  If 7PM-7AM, please contact night-coverage [email or web address] Password [email or web address] 05/12/2018, 9:22 AM   LOS: 1 day

## 2018-05-12 NOTE — Progress Notes (Addendum)
Patient ID: Cody Velez, male   DOB: [birth year], 72 y.o.   MRN: [id number] I reviewed the myelogram with Dr. Rolla Flatten. Mr. Wojdyla does not have any evidence of canal compromise spinal cord compression epidural collections or neural compromise from an anatomic basis nonetheless he has evolved significant weakness in his lower extremities with clonic jerks that are spontaneous in either lower extremity he notes that his right leg is now weaker than his left leg and it had been stronger all along during his recovery.  I will request neurology consult for further evaluation of this process. Spoke with Dr. Lucky Rathke. He may need further reevaluation with rehab as he did make significant progress in his recovery with their help.

## 2018-05-13 LAB — HEPARIN LEVEL (UNFRACTIONATED)
Heparin Unfractionated: 0.16 IU/mL — ABNORMAL LOW (ref 0.30–0.70)
Heparin Unfractionated: 0.27 IU/mL — ABNORMAL LOW (ref 0.30–0.70)
Heparin Unfractionated: 0.43 IU/mL (ref 0.30–0.70)

## 2018-05-13 LAB — CBC
HCT: 31.7 % — ABNORMAL LOW (ref 39.0–52.0)
Hemoglobin: 8.9 g/dL — ABNORMAL LOW (ref 13.0–17.0)
MCH: 23.4 pg — ABNORMAL LOW (ref 26.0–34.0)
MCHC: 28.1 g/dL — ABNORMAL LOW (ref 30.0–36.0)
MCV: 83.4 fL (ref 78.0–100.0)
Platelets: 552 10*3/uL — ABNORMAL HIGH (ref 150–400)
RBC: 3.8 MIL/uL — ABNORMAL LOW (ref 4.22–5.81)
RDW: 16.5 % — ABNORMAL HIGH (ref 11.5–15.5)
WBC: 7.4 10*3/uL (ref 4.0–10.5)

## 2018-05-13 LAB — BASIC METABOLIC PANEL
Anion gap: 6 (ref 5–15)
BUN: 6 mg/dL (ref 6–20)
CO2: 27 mmol/L (ref 22–32)
Calcium: 8.7 mg/dL — ABNORMAL LOW (ref 8.9–10.3)
Chloride: 105 mmol/L (ref 101–111)
Creatinine, Ser: 0.77 mg/dL (ref 0.61–1.24)
GFR calc Af Amer: >60 mL/min (ref >60)
GFR calc non Af Amer: >60 mL/min (ref >60)
Glucose, Bld: 126 mg/dL — ABNORMAL HIGH (ref 65–99)
Potassium: 3.7 mmol/L (ref 3.5–5.1)
Sodium: 138 mmol/L (ref 135–145)

## 2018-05-13 LAB — PROTIME-INR
INR: 1.18
Prothrombin Time: 14.9 seconds (ref 11.4–15.2)

## 2018-05-13 MED ORDER — WARFARIN SODIUM 7.5 MG PO TABS
7.5000 mg | ORAL_TABLET | Freq: Once | ORAL | Status: AC
  Administered 2018-05-13: 7.5 mg via ORAL
  Filled 2018-05-13: qty 1

## 2018-05-13 MED ORDER — HEPARIN BOLUS VIA INFUSION
2000.0000 [IU] | Freq: Once | INTRAVENOUS | Status: AC
  Administered 2018-05-13: 2000 [IU] via INTRAVENOUS
  Filled 2018-05-13: qty 2000

## 2018-05-13 MED ORDER — MAGNESIUM CITRATE PO SOLN
1.0000 | Freq: Once | ORAL | Status: AC
  Administered 2018-05-13: 1 via ORAL
  Filled 2018-05-13: qty 296

## 2018-05-13 MED ORDER — HEPARIN BOLUS VIA INFUSION
1000.0000 [IU] | Freq: Once | INTRAVENOUS | Status: AC
  Administered 2018-05-13: 1000 [IU] via INTRAVENOUS
  Filled 2018-05-13: qty 1000

## 2018-05-13 MED ORDER — POLYETHYLENE GLYCOL 3350 17 G PO PACK
17.0000 g | PACK | Freq: Every day | ORAL | Status: DC
  Administered 2018-05-13 – 2018-05-20 (×4): 17 g via ORAL
  Filled 2018-05-13 (×8): qty 1

## 2018-05-13 NOTE — Progress Notes (Signed)
[facility]                                                                              Patient Demographics  Cody Velez, is a 72 y.o. male, DOB - 02/06/1946, TKZ:[id number]  Admit date - 05/08/2018   Admitting Physician Phillips Grout, MD  Outpatient Primary MD for the patient is Asencion Noble, MD  Outpatient specialists:   LOS - 2  days   Medical records reviewed and are as summarized below:    Chief Complaint  Patient presents with  . Fall       Brief summary   Patient is a 72 year old male with hypertension, hyperlipidemia, morbid obesity, cad, chronic back pain status post spinal stimulator(C-spine) placed 1/30/19presented withincreasing mechanical falls at home. Patient was recently discharged from [facility] a stay from 03/30/2018 through 05/06/2018.  The patient was initially admitted in early April 2019, found to have MSSA bacteremia. Extensive work-up at that time revealed epidural abscess and T10-11 osteomyelitis and discitis, underwent laminectomy, decompression of epidural abscess, and methacrylate screw augmentation on 03/28/2018 performed by Dr. Ellene Route.The patient was followed by infectious disease. They recommended cefazolin and rifampin for 8 weeks to be completed on 05/22/2018. He was subsequently discharged to inpatient rehabilitation on 03/30/2018.  Patient was making significant progress at that time. However since discharge home, the patient has noted increasing lower extremity weakness and increasing falling.  Patient was transferred to [facility] for myelogram and neurosurgery evaluation.     Assessment & Plan   Bilateral lower extremity weakness, falls -Feeling somewhat better today, recently finished rehab, interested in CIR again  -CT cervical, thoracic, lumbar spine 5/22 showed new endplate erosion T73-22 with a increased paravertebral inflammation, concern for septic arthritis -Myelogram completed on 5/24, reviewed by  Dr. Ellene Route per neurosurgery, patient does not have evidence of canal compromise.,  Spinal cord compression epidural collections or neuro compromise from an anatomic basis. - Recommended neurology consult, seen by-Dr. Orlena Sheldon, recommended PT OT evaluation for rehab -PT OT evaluation today, will place CIR consult  Epidural abscess/vertebral osteomyelitis/discitis -Blood cultures had grown MSSA previously, continue cefazolin, rifampin, tentative stop date on 05/22/2018 -Status post I&D and laminectomy on 4/9  Acute DVT right peroneal vein, left soleal vein -Continue IV heparin bridge, restart warfarin  Essential hypertension Stable, continue amlodipine, lisinopril  Chronic pain syndrome -Continue gabapentin, dose decreased  Depression Continue home dose of venlafaxine  Hyperlipidemia Continue statin  Coronary artery disease Currently no chest pain, shortness of breath  Cognitive impairment, possible underlying dementia Continue Aricept, Namenda  Code Status: Full code DVT Prophylaxis: Heparin plus Coumadin Family Communication: Discussed in detail with the patient, all imaging results, lab results explained to the patient and wife*   Disposition Plan: When medically ready  Time Spent in minutes 35 minutes   Procedures:  Myelogram CT spine  Consultants:   Neurology Neurosurgery  Antimicrobials:      Medications  Scheduled Meds: . amLODipine  10 mg Oral Daily  . aspirin  81 mg Oral Daily  . docusate sodium  200 mg Oral BID  . donepezil  10 mg Oral QHS  .  finasteride  5 mg Oral Daily  . gabapentin  400 mg Oral TID  . latanoprost  1 drop Both Eyes QHS  . lidocaine (PF)  5 mL Other Once  . linaclotide  290 mcg Oral QAC breakfast  . lisinopril  5 mg Oral Daily  . Melatonin  1.5 mg Oral QHS  . memantine  10 mg Oral BID  . methocarbamol  500 mg Oral QID  . pantoprazole  40 mg Oral Daily  . polyethylene glycol  17 g Oral Daily  . potassium chloride SA  20 mEq  Oral Daily  . pravastatin  10 mg Oral QPM  . rifampin  300 mg Oral Q12H  . sodium chloride flush  3 mL Intravenous Q12H  . traZODone  100 mg Oral QHS  . venlafaxine XR  150 mg Oral BID  . vitamin B-12  1,000 mcg Oral Daily  . warfarin  7.5 mg Oral ONCE-1800  . Warfarin - Pharmacist Dosing Inpatient   Does not apply q1800   Continuous Infusions: . sodium chloride 250 mL (05/13/18 0556)  .  ceFAZolin (ANCEF) IV 2 g (05/13/18 1043)  . heparin 2,200 Units/hr (05/13/18 0406)   PRN Meds:.sodium chloride, fluticasone, ketorolac, oxyCODONE, sodium chloride flush   Antibiotics   Anti-infectives (From admission, onward)   Start     Dose/Rate Route Frequency Ordered Stop   05/09/18 0100  ceFAZolin (ANCEF) IVPB 2g/100 mL premix     2 g 200 mL/hr over 30 Minutes Intravenous Every 8 hours 05/08/18 2213 05/22/18 1659   05/08/18 2200  rifampin (RIFADIN) capsule 300 mg    Note to Pharmacy:  Continue through 05/22/2018 and stop     300 mg Oral Every 12 hours 05/08/18 2032 05/22/18 2159   05/08/18 2200  ceFAZolin (ANCEF) IVPB  Status:  Discontinued    Note to Pharmacy:  Indication:  MSSA Bacteremia and extensive epidural abscess discitis of thoracic spine Last Day of Therapy: 05/22/2018 Labs - Once weekly:  CBC/D and BMP, Labs - Every other week:  ESR and CRP     2 g Intravenous Every 8 hours 05/08/18 2032 05/08/18 2211   05/08/18 1530  ceFAZolin (ANCEF) 3 g in dextrose 5 % 50 mL IVPB     3 g 100 mL/hr over 30 Minutes Intravenous  Once 05/08/18 1521 05/08/18 1726        Subjective:   Cliford Sequeira was seen and examined today.  Feels better today, lower extremities weakness improving.  Wife at the bedside. Patient denies dizziness, chest pain, shortness of breath, abdominal pain, N/V/D/C. No acute events overnight.    Objective:   Vitals:   05/12/18 2308 05/12/18 2310 05/13/18 0300 05/13/18 0831  BP: 125/74  (!) 145/80 139/88  Pulse: 96  78 77  Resp: 20  (!) 21 18  Temp: 98.1 F (36.7  C)  98.7 F (37.1 C) 98.1 F (36.7 C)  TempSrc: Oral  Oral Oral  SpO2: 97%   99%  Weight:  114.7 kg (252 lb 13.9 oz)    Height:        Intake/Output Summary (Last 24 hours) at 05/13/2018 1047 Last data filed at 05/13/2018 0406 Gross per 24 hour  Intake -  Output 500 ml  Net -500 ml     Wt Readings from Last 3 Encounters:  05/12/18 114.7 kg (252 lb 13.9 oz)  05/06/18 125 kg (275 lb 9.2 oz)  03/27/18 130.4 kg (287 lb 7.7 oz)     Exam  General: Alert and oriented x 3, NAD  Eyes:   HEENT:  Atraumatic, normocephalic,   Cardiovascular: S1 S2 auscultated,Regular rate and rhythm.  Respiratory: Clear to auscultation bilaterally, no wheezing, rales or rhonchi  Gastrointestinal: Soft, nontender, nondistended, + bowel sounds  Ext: no pedal edema bilaterally  Neuro: strength improving in bilateral lower extremities  Musculoskeletal: No digital cyanosis, clubbing  Skin: No rashes  Psych: Normal affect and demeanor, alert and oriented x3    Data Reviewed:  I have personally reviewed following labs and imaging studies  Micro Results No results found for this or any previous visit (from the past 240 hour(s)).  Radiology Reports Dg Chest 1 View  Result Date: 04/25/2018 CLINICAL DATA:  Possible malfunction of the right-sided PICC line. EXAM: CHEST  1 VIEW COMPARISON:  Chest x-ray dated March 17, 2018 FINDINGS: The right-sided PICC line tip terminates at the junction of the right and left brachiocephalic veins. The lungs are well-expanded. The interstitial markings are increased. There is no alveolar infiltrate or significant pleural effusion. The heart is normal in size. The pulmonary vascularity is not engorged. There is calcification in the wall of the thoracic aorta. The patient has undergone previous mid and distal thoracic spinal fusion. There are nerve stimulator electrodes present which terminate in the lower cervical region. IMPRESSION: COPD. Mild interstitial prominence  may reflect pulmonary fibrosis or low-grade interstitial edema. Thoracic aortic atherosclerosis. Electronically Signed   By: David  Martinique M.D.   On: 04/25/2018 13:52   Dg Lumbar Spine 2-3 Views  Result Date: 05/08/2018 CLINICAL DATA:  Low back pain after a fall. EXAM: LUMBAR SPINE - 2-3 VIEW COMPARISON:  Spot fluoro films from 03/28/2018. FINDINGS: Patient is status post extensive thoracolumbar fusion posteriorly with incomplete visualization of the thoracolumbar fusion hardware. Image portions of the hardware show no complicating features. Interbody cages are seen at L4-5 and L5-S1. Bones are diffusely demineralized. Although no definite compression deformity is evident, lateral film is limited in utility given the demineralization. IMPRESSION: No evidence for an acute fracture on this limited two-view study. Electronically Signed   By: Misty Stanley M.D.   On: 05/08/2018 16:58   Dg Knee 1-2 Views Left  Result Date: 05/08/2018 CLINICAL DATA:  [place] Saturday landing on bilateral knees. EXAM: RIGHT KNEE - 1-2 VIEW; LEFT KNEE - 1-2 VIEW COMPARISON:  MRI of the LEFT knee November 14, 2009 FINDINGS: LEFT: No acute fracture deformity or dislocation. Moderate to severe tricompartmental joint space narrowing with periarticular sclerosis and marginal spurring. Osteopenia without destructive bony lesions. Small suprapatellar joint effusion. Mild vascular calcifications. RIGHT: No acute fracture deformity dislocation. Moderate tricompartmental joint space narrowing with periarticular sclerosis and marginal spurring. No destructive bony lesions. Osteopenia. Soft tissue planes are nonsuspicious. IMPRESSION: 1. No acute fracture deformity dislocation. Small LEFT suprapatellar joint effusion. 2. Moderate RIGHT and moderate to severe LEFT tricompartmental osteoarthrosis. Electronically Signed   By: Elon Alas M.D.   On: 05/08/2018 16:55   Dg Knee 1-2 Views Right  Result Date: 05/08/2018 CLINICAL DATA:  [place]  Saturday landing on bilateral knees. EXAM: RIGHT KNEE - 1-2 VIEW; LEFT KNEE - 1-2 VIEW COMPARISON:  MRI of the LEFT knee November 14, 2009 FINDINGS: LEFT: No acute fracture deformity or dislocation. Moderate to severe tricompartmental joint space narrowing with periarticular sclerosis and marginal spurring. Osteopenia without destructive bony lesions. Small suprapatellar joint effusion. Mild vascular calcifications. RIGHT: No acute fracture deformity dislocation. Moderate tricompartmental joint space narrowing with periarticular sclerosis and marginal spurring. No destructive bony  lesions. Osteopenia. Soft tissue planes are nonsuspicious. IMPRESSION: 1. No acute fracture deformity dislocation. Small LEFT suprapatellar joint effusion. 2. Moderate RIGHT and moderate to severe LEFT tricompartmental osteoarthrosis. Electronically Signed   By: Elon Alas M.D.   On: 05/08/2018 16:55   Ct Cervical Spine Wo Contrast  Result Date: 05/12/2018 CLINICAL DATA:  Worsening paraplegia.  History of spinal infection. FLUOROSCOPY TIME:  1 minutes and 40 seconds.  Fifteen spot films. PROCEDURE: LUMBAR PUNCTURE FOR CERVICAL LUMBAR AND THORACIC MYELOGRAM CERVICAL AND LUMBAR AND THORACIC MYELOGRAM CT CERVICAL MYELOGRAM CT LUMBAR MYELOGRAM CT THORACIC MYELOGRAM After thorough discussion of risks and benefits of the procedure including bleeding, infection, injury to nerves, blood vessels, adjacent structures as well as headache and CSF leak, written and oral informed consent was obtained. Consent was obtained by Dr. Rolla Flatten. Patient was positioned prone on the fluoroscopy table. Local anesthesia was provided with 1% lidocaine without epinephrine after prepped and draped in the usual sterile fashion. Puncture was performed at L2-L3 using a 3 1/2 inch 20 gauge spinal needle via midline approach. Using a single pass through the dura, the needle was placed within the thecal sac, with return of clear CSF. 10 mL Isovue M-300 was  injected into the thecal sac, with normal opacification of the nerve roots and cauda equina consistent with free flow within the subarachnoid space. The patient was then moved to the trendelenburg position and contrast flowed into the Thoracic and Cervical spine regions. I personally performed the lumbar puncture and administered the intrathecal contrast. I also personally supervised acquisition of the myelogram images. TECHNIQUE: Contiguous axial images were obtained through the Cervical, Thoracic, and Lumbar spine after the intrathecal infusion of infusion. Coronal and sagittal reconstructions were obtained of the axial image sets. FINDINGS: TOTAL MYELOGRAM FINDINGS: Good opacification lumbar subarachnoid space. No stenosis or blockage. Hardware intact. Patient was placed in lateral decubitus and contrast maneuvered into the thoracic region where no block was evident. Patient was further tilted down in contrast maneuvered into the cervical region where no block was present. Dorsal column stimulator leads spanned the cervicothoracic junction. Patient was escorted to CT scanning for further evaluation. CT CERVICAL MYELOGRAM FINDINGS: Alignment: Anatomic except for mild facet mediated slip C2-C3, adjacent segment. Vertebrae: No worrisome osseous lesion.  Solid arthrodesis C3-C6. Cord: Dorsal column stimulator terminating at C4. No cord compression. Paraspinal tissues: Unremarkable. Disc levels: Unchanged disc and facet degeneration compared to last month study. Uncovertebral and facet spurring result in multilevel neural foraminal stenosis moderate to severe on the LEFT at C2-3 and moderate to severe on the RIGHT at C3-4. CT LUMBAR MYELOGRAM FINDINGS: Segmentation: Normal. Alignment:  Normal. Vertebrae: No worrisome osseous lesion.L1-2 S1 fusion appears solid. Conus medullaris: Normal in size and location. Paraspinal tissues: No evidence for hydronephrosis or paravertebral mass. Disc levels: Unchanged osseous neural  foraminal stenosis at multiple levels, moderate on the LEFT at L5-S1. No significant spinal stenosis. CT THORACIC MYELOGRAM FINDINGS: Alignment: 4 mm anterolisthesis T10 on T11, new/increased from prior. Vertebrae: Erosive endplate changes at J03-00, without disc space collapse. T8-L1 fusion posteriorly. Pedicle screws in good position. Paraspinal tissues: Phlegmonous changes from T9 through T12 greater on the RIGHT. Small pleural effusion.Cord: No cord compression at any level. No significant ventral epidural process at T10-11. Cord: No cord compression, cord enlargement, or significant ventral epidural process at any level. Disc levels: Unchanged severe disc space narrowing and degenerative change at T2-T3. Interval T10-T11 posterior decompression. IMPRESSION: Total myelogram with cervical, thoracic and lumbar postmyelogram CT  demonstrates no compressive lesion of the cord at any level. Spinal stimulator device, along with multilevel multisegment fusion hardware appear appropriately placed without adverse features. There is probably some residual infection and paravertebral phlegmon at the T10-11 interspace, but there is no ventral epidural hematoma. Electronically Signed   By: Staci Righter M.D.   On: 05/12/2018 17:38   Ct Cervical Spine W Contrast  Result Date: 05/11/2018 CLINICAL DATA:  History of discitis-osteomyelitis with epidural abscess at T10-11. Status post T10-11 decompression and debridement with thoracolumbar fusion on 03/28/2018. Worsening lower extremity weakness. EXAM: CT CERVICAL, THORACIC, AND LUMBAR SPINE WITH CONTRAST TECHNIQUE: Multidetector CT imaging of the cervical, lumbar, and thoracic spine was performed with intravenous contrast. Multiplanar CT image reconstructions were also generated. CONTRAST:  167m ISOVUE-300 IOPAMIDOL (ISOVUE-300) INJECTION 61% COMPARISON:  Cervical spine CT 03/22/2018. Thoracic and lumbar spine CT 03/20/2018. FINDINGS: CT CERVICAL SPINE FINDINGS Alignment:  Unchanged cervical spine straightening and trace anterolisthesis of C2 on C3. Skull base and vertebrae: No acute fracture. No interval osseous erosion to suggest infectious discitis-osteomyelitis. Solid C3-C6 ACDF. Soft tissues and spinal canal: Unchanged dorsal column stimulator terminating at the C4 level. No gross epidural fluid collection, however artifact from the stimulator and fusion plate and screws limits assessment. No evidence of significant paraspinal or prevertebral inflammatory changes or fluid collection. Disc levels: Unchanged disc and facet degeneration compared to last month's study. Uncovertebral and facet spurring again result in multilevel neural foraminal stenosis, moderate to severe on the left at C2-3 and on the right at C3-4. Upper chest: Partially visualized right PICC. Aortic atherosclerosis. Small right pleural effusion CT THORACIC SPINE FINDINGS Alignment: 4 mm anterolisthesis of T10 on T11, new/increased from prior. Vertebrae: There are new erosive endplate changes at TC58-52without disc space collapse. Right T10-11 facet joint widening is unchanged with slightly increased erosion of the right T10 inferior facet. Interval T8-L1 posterior fusion has been performed. Pedicle screws appear well-positioned without evidence of loosening. No acute fracture is identified. Paraspinal and other soft tissues: Paravertebral edema/phlegmonous changes from T9-T12 are greater on the right than on the left and are stable to slightly increased. No organized fluid collection is identified. There is a small right pleural effusion, and there is dependent subsegmental atelectasis in both lungs. A right PICC terminates in the lower SVC. Aortic and coronary artery atherosclerosis is noted. A dorsal column stimulator enters the canal at T3-4. Disc levels: Unchanged severe disc space narrowing and degenerative endplate changes at TD7-8 Interval T10-11 posterior decompression. Limited assessment for an epidural  fluid collection due to streak artifact from posterior spinal instrumentation. No gross epidural collection above the fused segment. CT LUMBAR SPINE FINDINGS Segmentation: 5 lumbar type vertebrae. Alignment: Normal. Vertebrae: Prior L1-L3 posterior fusion, extended in the interim into the thoracic spine to the T8 level as described above. Prior interbody fusion from L1-2 to L5-S1. No evidence of screw loosening. Solid ankylosis at each level. No acute fracture. No osseous erosion to suggest active osteomyelitis. Paraspinal and other soft tissues: No significant paraspinal inflammatory changes or fluid collection to suggest infection. Mild aortoiliac atherosclerosis. Disc levels: Unchanged osseous neural foraminal stenosis at multiple levels, moderate on the left at L5-S1 and milder elsewhere as previously described. No gross spinal stenosis. IMPRESSION: 1. New endplate erosion at TE42-35with stable to slightly increased paravertebral inflammation consistent with known discitis-osteomyelitis. Limited assessment for epidural abscess. 2. Mild interval osseous erosion involving the right T10-11 facet joint which may reflect septic arthritis. 3. No findings to suggest  interval development of infection elsewhere in the cervical, thoracic, or lumbar spine. 4. Interval posterior thoracolumbar fusion as above. Electronically Signed   By: Logan Bores M.D.   On: 05/11/2018 09:18   Ct Thoracic Spine Wo Contrast  Result Date: 05/12/2018 CLINICAL DATA:  Worsening paraplegia.  History of spinal infection. FLUOROSCOPY TIME:  1 minutes and 40 seconds.  Fifteen spot films. PROCEDURE: LUMBAR PUNCTURE FOR CERVICAL LUMBAR AND THORACIC MYELOGRAM CERVICAL AND LUMBAR AND THORACIC MYELOGRAM CT CERVICAL MYELOGRAM CT LUMBAR MYELOGRAM CT THORACIC MYELOGRAM After thorough discussion of risks and benefits of the procedure including bleeding, infection, injury to nerves, blood vessels, adjacent structures as well as headache and CSF leak,  written and oral informed consent was obtained. Consent was obtained by Dr. Rolla Flatten. Patient was positioned prone on the fluoroscopy table. Local anesthesia was provided with 1% lidocaine without epinephrine after prepped and draped in the usual sterile fashion. Puncture was performed at L2-L3 using a 3 1/2 inch 20 gauge spinal needle via midline approach. Using a single pass through the dura, the needle was placed within the thecal sac, with return of clear CSF. 10 mL Isovue M-300 was injected into the thecal sac, with normal opacification of the nerve roots and cauda equina consistent with free flow within the subarachnoid space. The patient was then moved to the trendelenburg position and contrast flowed into the Thoracic and Cervical spine regions. I personally performed the lumbar puncture and administered the intrathecal contrast. I also personally supervised acquisition of the myelogram images. TECHNIQUE: Contiguous axial images were obtained through the Cervical, Thoracic, and Lumbar spine after the intrathecal infusion of infusion. Coronal and sagittal reconstructions were obtained of the axial image sets. FINDINGS: TOTAL MYELOGRAM FINDINGS: Good opacification lumbar subarachnoid space. No stenosis or blockage. Hardware intact. Patient was placed in lateral decubitus and contrast maneuvered into the thoracic region where no block was evident. Patient was further tilted down in contrast maneuvered into the cervical region where no block was present. Dorsal column stimulator leads spanned the cervicothoracic junction. Patient was escorted to CT scanning for further evaluation. CT CERVICAL MYELOGRAM FINDINGS: Alignment: Anatomic except for mild facet mediated slip C2-C3, adjacent segment. Vertebrae: No worrisome osseous lesion.  Solid arthrodesis C3-C6. Cord: Dorsal column stimulator terminating at C4. No cord compression. Paraspinal tissues: Unremarkable. Disc levels: Unchanged disc and facet degeneration  compared to last month study. Uncovertebral and facet spurring result in multilevel neural foraminal stenosis moderate to severe on the LEFT at C2-3 and moderate to severe on the RIGHT at C3-4. CT LUMBAR MYELOGRAM FINDINGS: Segmentation: Normal. Alignment:  Normal. Vertebrae: No worrisome osseous lesion.L1-2 S1 fusion appears solid. Conus medullaris: Normal in size and location. Paraspinal tissues: No evidence for hydronephrosis or paravertebral mass. Disc levels: Unchanged osseous neural foraminal stenosis at multiple levels, moderate on the LEFT at L5-S1. No significant spinal stenosis. CT THORACIC MYELOGRAM FINDINGS: Alignment: 4 mm anterolisthesis T10 on T11, new/increased from prior. Vertebrae: Erosive endplate changes at W80-32, without disc space collapse. T8-L1 fusion posteriorly. Pedicle screws in good position. Paraspinal tissues: Phlegmonous changes from T9 through T12 greater on the RIGHT. Small pleural effusion.Cord: No cord compression at any level. No significant ventral epidural process at T10-11. Cord: No cord compression, cord enlargement, or significant ventral epidural process at any level. Disc levels: Unchanged severe disc space narrowing and degenerative change at T2-T3. Interval T10-T11 posterior decompression. IMPRESSION: Total myelogram with cervical, thoracic and lumbar postmyelogram CT demonstrates no compressive lesion of the cord at any level.  Spinal stimulator device, along with multilevel multisegment fusion hardware appear appropriately placed without adverse features. There is probably some residual infection and paravertebral phlegmon at the T10-11 interspace, but there is no ventral epidural hematoma. Electronically Signed   By: Staci Righter M.D.   On: 05/12/2018 17:38   Ct Thoracic Spine W Contrast  Result Date: 05/11/2018 CLINICAL DATA:  History of discitis-osteomyelitis with epidural abscess at T10-11. Status post T10-11 decompression and debridement with thoracolumbar  fusion on 03/28/2018. Worsening lower extremity weakness. EXAM: CT CERVICAL, THORACIC, AND LUMBAR SPINE WITH CONTRAST TECHNIQUE: Multidetector CT imaging of the cervical, lumbar, and thoracic spine was performed with intravenous contrast. Multiplanar CT image reconstructions were also generated. CONTRAST:  166m ISOVUE-300 IOPAMIDOL (ISOVUE-300) INJECTION 61% COMPARISON:  Cervical spine CT 03/22/2018. Thoracic and lumbar spine CT 03/20/2018. FINDINGS: CT CERVICAL SPINE FINDINGS Alignment: Unchanged cervical spine straightening and trace anterolisthesis of C2 on C3. Skull base and vertebrae: No acute fracture. No interval osseous erosion to suggest infectious discitis-osteomyelitis. Solid C3-C6 ACDF. Soft tissues and spinal canal: Unchanged dorsal column stimulator terminating at the C4 level. No gross epidural fluid collection, however artifact from the stimulator and fusion plate and screws limits assessment. No evidence of significant paraspinal or prevertebral inflammatory changes or fluid collection. Disc levels: Unchanged disc and facet degeneration compared to last month's study. Uncovertebral and facet spurring again result in multilevel neural foraminal stenosis, moderate to severe on the left at C2-3 and on the right at C3-4. Upper chest: Partially visualized right PICC. Aortic atherosclerosis. Small right pleural effusion CT THORACIC SPINE FINDINGS Alignment: 4 mm anterolisthesis of T10 on T11, new/increased from prior. Vertebrae: There are new erosive endplate changes at TW97-98without disc space collapse. Right T10-11 facet joint widening is unchanged with slightly increased erosion of the right T10 inferior facet. Interval T8-L1 posterior fusion has been performed. Pedicle screws appear well-positioned without evidence of loosening. No acute fracture is identified. Paraspinal and other soft tissues: Paravertebral edema/phlegmonous changes from T9-T12 are greater on the right than on the left and are  stable to slightly increased. No organized fluid collection is identified. There is a small right pleural effusion, and there is dependent subsegmental atelectasis in both lungs. A right PICC terminates in the lower SVC. Aortic and coronary artery atherosclerosis is noted. A dorsal column stimulator enters the canal at T3-4. Disc levels: Unchanged severe disc space narrowing and degenerative endplate changes at TX2-1 Interval T10-11 posterior decompression. Limited assessment for an epidural fluid collection due to streak artifact from posterior spinal instrumentation. No gross epidural collection above the fused segment. CT LUMBAR SPINE FINDINGS Segmentation: 5 lumbar type vertebrae. Alignment: Normal. Vertebrae: Prior L1-L3 posterior fusion, extended in the interim into the thoracic spine to the T8 level as described above. Prior interbody fusion from L1-2 to L5-S1. No evidence of screw loosening. Solid ankylosis at each level. No acute fracture. No osseous erosion to suggest active osteomyelitis. Paraspinal and other soft tissues: No significant paraspinal inflammatory changes or fluid collection to suggest infection. Mild aortoiliac atherosclerosis. Disc levels: Unchanged osseous neural foraminal stenosis at multiple levels, moderate on the left at L5-S1 and milder elsewhere as previously described. No gross spinal stenosis. IMPRESSION: 1. New endplate erosion at TJ94-17with stable to slightly increased paravertebral inflammation consistent with known discitis-osteomyelitis. Limited assessment for epidural abscess. 2. Mild interval osseous erosion involving the right T10-11 facet joint which may reflect septic arthritis. 3. No findings to suggest interval development of infection elsewhere in the cervical, thoracic, or  lumbar spine. 4. Interval posterior thoracolumbar fusion as above. Electronically Signed   By: Logan Bores M.D.   On: 05/11/2018 09:18   Ct Lumbar Spine Wo Contrast  Result Date:  05/12/2018 CLINICAL DATA:  Worsening paraplegia.  History of spinal infection. FLUOROSCOPY TIME:  1 minutes and 40 seconds.  Fifteen spot films. PROCEDURE: LUMBAR PUNCTURE FOR CERVICAL LUMBAR AND THORACIC MYELOGRAM CERVICAL AND LUMBAR AND THORACIC MYELOGRAM CT CERVICAL MYELOGRAM CT LUMBAR MYELOGRAM CT THORACIC MYELOGRAM After thorough discussion of risks and benefits of the procedure including bleeding, infection, injury to nerves, blood vessels, adjacent structures as well as headache and CSF leak, written and oral informed consent was obtained. Consent was obtained by Dr. Rolla Flatten. Patient was positioned prone on the fluoroscopy table. Local anesthesia was provided with 1% lidocaine without epinephrine after prepped and draped in the usual sterile fashion. Puncture was performed at L2-L3 using a 3 1/2 inch 20 gauge spinal needle via midline approach. Using a single pass through the dura, the needle was placed within the thecal sac, with return of clear CSF. 10 mL Isovue M-300 was injected into the thecal sac, with normal opacification of the nerve roots and cauda equina consistent with free flow within the subarachnoid space. The patient was then moved to the trendelenburg position and contrast flowed into the Thoracic and Cervical spine regions. I personally performed the lumbar puncture and administered the intrathecal contrast. I also personally supervised acquisition of the myelogram images. TECHNIQUE: Contiguous axial images were obtained through the Cervical, Thoracic, and Lumbar spine after the intrathecal infusion of infusion. Coronal and sagittal reconstructions were obtained of the axial image sets. FINDINGS: TOTAL MYELOGRAM FINDINGS: Good opacification lumbar subarachnoid space. No stenosis or blockage. Hardware intact. Patient was placed in lateral decubitus and contrast maneuvered into the thoracic region where no block was evident. Patient was further tilted down in contrast maneuvered into the  cervical region where no block was present. Dorsal column stimulator leads spanned the cervicothoracic junction. Patient was escorted to CT scanning for further evaluation. CT CERVICAL MYELOGRAM FINDINGS: Alignment: Anatomic except for mild facet mediated slip C2-C3, adjacent segment. Vertebrae: No worrisome osseous lesion.  Solid arthrodesis C3-C6. Cord: Dorsal column stimulator terminating at C4. No cord compression. Paraspinal tissues: Unremarkable. Disc levels: Unchanged disc and facet degeneration compared to last month study. Uncovertebral and facet spurring result in multilevel neural foraminal stenosis moderate to severe on the LEFT at C2-3 and moderate to severe on the RIGHT at C3-4. CT LUMBAR MYELOGRAM FINDINGS: Segmentation: Normal. Alignment:  Normal. Vertebrae: No worrisome osseous lesion.L1-2 S1 fusion appears solid. Conus medullaris: Normal in size and location. Paraspinal tissues: No evidence for hydronephrosis or paravertebral mass. Disc levels: Unchanged osseous neural foraminal stenosis at multiple levels, moderate on the LEFT at L5-S1. No significant spinal stenosis. CT THORACIC MYELOGRAM FINDINGS: Alignment: 4 mm anterolisthesis T10 on T11, new/increased from prior. Vertebrae: Erosive endplate changes at E32-12, without disc space collapse. T8-L1 fusion posteriorly. Pedicle screws in good position. Paraspinal tissues: Phlegmonous changes from T9 through T12 greater on the RIGHT. Small pleural effusion.Cord: No cord compression at any level. No significant ventral epidural process at T10-11. Cord: No cord compression, cord enlargement, or significant ventral epidural process at any level. Disc levels: Unchanged severe disc space narrowing and degenerative change at T2-T3. Interval T10-T11 posterior decompression. IMPRESSION: Total myelogram with cervical, thoracic and lumbar postmyelogram CT demonstrates no compressive lesion of the cord at any level. Spinal stimulator device, along with  multilevel multisegment fusion hardware  appear appropriately placed without adverse features. There is probably some residual infection and paravertebral phlegmon at the T10-11 interspace, but there is no ventral epidural hematoma. Electronically Signed   By: Staci Righter M.D.   On: 05/12/2018 17:38   Ct Lumbar Spine W Contrast  Result Date: 05/11/2018 CLINICAL DATA:  History of discitis-osteomyelitis with epidural abscess at T10-11. Status post T10-11 decompression and debridement with thoracolumbar fusion on 03/28/2018. Worsening lower extremity weakness. EXAM: CT CERVICAL, THORACIC, AND LUMBAR SPINE WITH CONTRAST TECHNIQUE: Multidetector CT imaging of the cervical, lumbar, and thoracic spine was performed with intravenous contrast. Multiplanar CT image reconstructions were also generated. CONTRAST:  173m ISOVUE-300 IOPAMIDOL (ISOVUE-300) INJECTION 61% COMPARISON:  Cervical spine CT 03/22/2018. Thoracic and lumbar spine CT 03/20/2018. FINDINGS: CT CERVICAL SPINE FINDINGS Alignment: Unchanged cervical spine straightening and trace anterolisthesis of C2 on C3. Skull base and vertebrae: No acute fracture. No interval osseous erosion to suggest infectious discitis-osteomyelitis. Solid C3-C6 ACDF. Soft tissues and spinal canal: Unchanged dorsal column stimulator terminating at the C4 level. No gross epidural fluid collection, however artifact from the stimulator and fusion plate and screws limits assessment. No evidence of significant paraspinal or prevertebral inflammatory changes or fluid collection. Disc levels: Unchanged disc and facet degeneration compared to last month's study. Uncovertebral and facet spurring again result in multilevel neural foraminal stenosis, moderate to severe on the left at C2-3 and on the right at C3-4. Upper chest: Partially visualized right PICC. Aortic atherosclerosis. Small right pleural effusion CT THORACIC SPINE FINDINGS Alignment: 4 mm anterolisthesis of T10 on T11,  new/increased from prior. Vertebrae: There are new erosive endplate changes at TT55-73without disc space collapse. Right T10-11 facet joint widening is unchanged with slightly increased erosion of the right T10 inferior facet. Interval T8-L1 posterior fusion has been performed. Pedicle screws appear well-positioned without evidence of loosening. No acute fracture is identified. Paraspinal and other soft tissues: Paravertebral edema/phlegmonous changes from T9-T12 are greater on the right than on the left and are stable to slightly increased. No organized fluid collection is identified. There is a small right pleural effusion, and there is dependent subsegmental atelectasis in both lungs. A right PICC terminates in the lower SVC. Aortic and coronary artery atherosclerosis is noted. A dorsal column stimulator enters the canal at T3-4. Disc levels: Unchanged severe disc space narrowing and degenerative endplate changes at TU2-0 Interval T10-11 posterior decompression. Limited assessment for an epidural fluid collection due to streak artifact from posterior spinal instrumentation. No gross epidural collection above the fused segment. CT LUMBAR SPINE FINDINGS Segmentation: 5 lumbar type vertebrae. Alignment: Normal. Vertebrae: Prior L1-L3 posterior fusion, extended in the interim into the thoracic spine to the T8 level as described above. Prior interbody fusion from L1-2 to L5-S1. No evidence of screw loosening. Solid ankylosis at each level. No acute fracture. No osseous erosion to suggest active osteomyelitis. Paraspinal and other soft tissues: No significant paraspinal inflammatory changes or fluid collection to suggest infection. Mild aortoiliac atherosclerosis. Disc levels: Unchanged osseous neural foraminal stenosis at multiple levels, moderate on the left at L5-S1 and milder elsewhere as previously described. No gross spinal stenosis. IMPRESSION: 1. New endplate erosion at TU54-27with stable to slightly increased  paravertebral inflammation consistent with known discitis-osteomyelitis. Limited assessment for epidural abscess. 2. Mild interval osseous erosion involving the right T10-11 facet joint which may reflect septic arthritis. 3. No findings to suggest interval development of infection elsewhere in the cervical, thoracic, or lumbar spine. 4. Interval posterior thoracolumbar fusion as above.  Electronically Signed   By: Logan Bores M.D.   On: 05/11/2018 09:18   Dg Chest Portable 1 View  Result Date: 05/08/2018 CLINICAL DATA:  Verify PICC line placement. EXAM: PORTABLE CHEST 1 VIEW COMPARISON:  Apr 25, 2018 FINDINGS: A right PICC line is identified. The distal tip is difficult to visualize but appears to terminate in the central SVC. No change in the cardiomediastinal silhouette. No pneumothorax. No other acute interval changes. IMPRESSION: The right PICC line appears to be in good position. The distal tip is difficult to visualize with confidence but appears to be in the central SVC. Electronically Signed   By: Dorise Bullion III M.D   On: 05/08/2018 16:03   Dg Myelography Lumbar Inj Thoracic  Result Date: 05/12/2018 CLINICAL DATA:  Worsening paraplegia.  History of spinal infection. FLUOROSCOPY TIME:  1 minutes and 40 seconds.  Fifteen spot films. PROCEDURE: LUMBAR PUNCTURE FOR CERVICAL LUMBAR AND THORACIC MYELOGRAM CERVICAL AND LUMBAR AND THORACIC MYELOGRAM CT CERVICAL MYELOGRAM CT LUMBAR MYELOGRAM CT THORACIC MYELOGRAM After thorough discussion of risks and benefits of the procedure including bleeding, infection, injury to nerves, blood vessels, adjacent structures as well as headache and CSF leak, written and oral informed consent was obtained. Consent was obtained by Dr. Rolla Flatten. Patient was positioned prone on the fluoroscopy table. Local anesthesia was provided with 1% lidocaine without epinephrine after prepped and draped in the usual sterile fashion. Puncture was performed at L2-L3 using a 3 1/2 inch  20 gauge spinal needle via midline approach. Using a single pass through the dura, the needle was placed within the thecal sac, with return of clear CSF. 10 mL Isovue M-300 was injected into the thecal sac, with normal opacification of the nerve roots and cauda equina consistent with free flow within the subarachnoid space. The patient was then moved to the trendelenburg position and contrast flowed into the Thoracic and Cervical spine regions. I personally performed the lumbar puncture and administered the intrathecal contrast. I also personally supervised acquisition of the myelogram images. TECHNIQUE: Contiguous axial images were obtained through the Cervical, Thoracic, and Lumbar spine after the intrathecal infusion of infusion. Coronal and sagittal reconstructions were obtained of the axial image sets. FINDINGS: TOTAL MYELOGRAM FINDINGS: Good opacification lumbar subarachnoid space. No stenosis or blockage. Hardware intact. Patient was placed in lateral decubitus and contrast maneuvered into the thoracic region where no block was evident. Patient was further tilted down in contrast maneuvered into the cervical region where no block was present. Dorsal column stimulator leads spanned the cervicothoracic junction. Patient was escorted to CT scanning for further evaluation. CT CERVICAL MYELOGRAM FINDINGS: Alignment: Anatomic except for mild facet mediated slip C2-C3, adjacent segment. Vertebrae: No worrisome osseous lesion.  Solid arthrodesis C3-C6. Cord: Dorsal column stimulator terminating at C4. No cord compression. Paraspinal tissues: Unremarkable. Disc levels: Unchanged disc and facet degeneration compared to last month study. Uncovertebral and facet spurring result in multilevel neural foraminal stenosis moderate to severe on the LEFT at C2-3 and moderate to severe on the RIGHT at C3-4. CT LUMBAR MYELOGRAM FINDINGS: Segmentation: Normal. Alignment:  Normal. Vertebrae: No worrisome osseous lesion.L1-2 S1  fusion appears solid. Conus medullaris: Normal in size and location. Paraspinal tissues: No evidence for hydronephrosis or paravertebral mass. Disc levels: Unchanged osseous neural foraminal stenosis at multiple levels, moderate on the LEFT at L5-S1. No significant spinal stenosis. CT THORACIC MYELOGRAM FINDINGS: Alignment: 4 mm anterolisthesis T10 on T11, new/increased from prior. Vertebrae: Erosive endplate changes at M25-00, without disc space  collapse. T8-L1 fusion posteriorly. Pedicle screws in good position. Paraspinal tissues: Phlegmonous changes from T9 through T12 greater on the RIGHT. Small pleural effusion.Cord: No cord compression at any level. No significant ventral epidural process at T10-11. Cord: No cord compression, cord enlargement, or significant ventral epidural process at any level. Disc levels: Unchanged severe disc space narrowing and degenerative change at T2-T3. Interval T10-T11 posterior decompression. IMPRESSION: Total myelogram with cervical, thoracic and lumbar postmyelogram CT demonstrates no compressive lesion of the cord at any level. Spinal stimulator device, along with multilevel multisegment fusion hardware appear appropriately placed without adverse features. There is probably some residual infection and paravertebral phlegmon at the T10-11 interspace, but there is no ventral epidural hematoma. Electronically Signed   By: Staci Righter M.D.   On: 05/12/2018 17:38   Dg Hip Unilat With Pelvis 2-3 Views Right  Result Date: 05/08/2018 CLINICAL DATA:  Fall, right hip pain EXAM: DG HIP (WITH OR WITHOUT PELVIS) 2-3V RIGHT COMPARISON:  None. FINDINGS: No fracture or dislocation is seen. Bilateral hip joint spaces are preserved. Visualized bony pelvis appears intact. Degenerative changes at L5-S1. IMPRESSION: Negative. Electronically Signed   By: Julian Hy M.D.   On: 05/08/2018 16:53   [place] Ekg Site Rite  Result Date: 04/25/2018 If Site Rite image not attached, placement could  not be confirmed due to current cardiac rhythm.   Lab Data:  CBC: Recent Labs  Lab 05/08/18 1839 05/10/18 0933 05/11/18 0659 05/12/18 0522 05/13/18 0236  WBC 12.0* 9.4 8.0 5.7 7.4  NEUTROABS 8.7*  --   --   --   --   HGB 10.0* 9.4* 9.4* 9.1* 8.9*  HCT 34.5* 33.0* 33.0* 31.8* 31.7*  MCV 82.5 82.5 83.5 83.9 83.4  PLT 579* 532* 579* 589* 220*   Basic Metabolic Panel: Recent Labs  Lab 05/08/18 1839 05/09/18 0633 05/10/18 0933 05/11/18 0659 05/12/18 0522 05/13/18 0236  NA 134*  --  135  --  137 138  K 4.0  --  3.9  --  3.8 3.7  CL 100*  --  99*  --  102 105  CO2 26  --  29  --  27 27  GLUCOSE 116*  --  109*  --  77 126*  BUN 10  --  9  --  12 6  CREATININE 0.79  --  0.74  --  0.64 0.77  CALCIUM 8.8*  --  8.9  --  8.7* 8.7*  MG  --  1.7 1.7 2.0  --   --    GFR: Estimated Creatinine Clearance: 115.7 mL/min (by C-G formula based on SCr of 0.77 mg/dL). Liver Function Tests: Recent Labs  Lab 05/08/18 1839  AST 12*  ALT 7*  ALKPHOS 99  BILITOT 0.5  PROT 7.5  ALBUMIN 2.7*   No results for input(s): LIPASE, AMYLASE in the last 168 hours. No results for input(s): AMMONIA in the last 168 hours. Coagulation Profile: Recent Labs  Lab 05/10/18 0625 05/11/18 0659 05/11/18 1953 05/12/18 0522 05/13/18 0236  INR 2.85 2.81 1.54 1.30 1.18   Cardiac Enzymes: Recent Labs  Lab 05/11/18 0659  CKTOTAL 15*   BNP (last 3 results) No results for input(s): PROBNP in the last 8760 hours. HbA1C: No results for input(s): HGBA1C in the last 72 hours. CBG: No results for input(s): GLUCAP in the last 168 hours. Lipid Profile: No results for input(s): CHOL, HDL, LDLCALC, TRIG, CHOLHDL, LDLDIRECT in the last 72 hours. Thyroid Function Tests: No results for input(s):  TSH, T4TOTAL, FREET4, T3FREE, THYROIDAB in the last 72 hours. Anemia Panel: No results for input(s): VITAMINB12, FOLATE, FERRITIN, TIBC, IRON, RETICCTPCT in the last 72 hours. Urine analysis:    Component Value  Date/Time   COLORURINE AMBER (A) 04/05/2018 1621   APPEARANCEUR CLEAR 04/05/2018 1621   LABSPEC 1.029 04/05/2018 1621   PHURINE 5.0 04/05/2018 1621   GLUCOSEU NEGATIVE 04/05/2018 1621   HGBUR NEGATIVE 04/05/2018 1621   BILIRUBINUR MODERATE (A) 04/05/2018 1621   KETONESUR NEGATIVE 04/05/2018 1621   PROTEINUR 30 (A) 04/05/2018 1621   UROBILINOGEN 0.2 03/05/2011 1909   NITRITE NEGATIVE 04/05/2018 1621   LEUKOCYTESUR NEGATIVE 04/05/2018 1621     Ripudeep Rai M.D. [facility] 05/13/2018, 10:47 AM  Pager: [phone] Between 7am to 7pm - call Pager - [phone]  After 7pm go to [email or web address] - password [email or web address]  Call night coverage person covering after 7pm

## 2018-05-13 NOTE — Progress Notes (Addendum)
ANTICOAGULATION CONSULT NOTE   Pharmacy Consult for heparin Indication: bridging for leg DVT after Coumadin reversal for myelogram  No Known Allergies  Patient Measurements: Height: 6\' 3"  (190.5 cm) Weight: 252 lb 13.9 oz (114.7 kg) IBW/kg (Calculated) : 84.5 Heparin Dosing Weight: 111.4 kg  Vital Signs: Temp: 98.1 F (36.7 C) (05/24 2308) Temp Source: Oral (05/24 2308) BP: 125/74 (05/24 2308) Pulse Rate: 96 (05/24 2308)  Labs: Recent Labs    05/10/18 0933 05/11/18 0659 05/11/18 1953 05/12/18 0522 05/12/18 1900 05/13/18 0236  HGB 9.4* 9.4*  --  9.1*  --  8.9*  HCT 33.0* 33.0*  --  31.8*  --  31.7*  PLT 532* 579*  --  589*  --  552*  LABPROT  --  29.4* 18.4* 16.1*  --  14.9  INR  --  2.81 1.54 1.30  --  1.18  HEPARINUNFRC  --   --   --  <0.10* <0.10* 0.16*  CREATININE 0.74  --   --  0.64  --   --   CKTOTAL  --  15*  --   --   --   --     Estimated Creatinine Clearance: 115.7 mL/min (by C-G formula based on SCr of 0.64 mg/dL).   Assessment: Pharmacy consulted to do heparin for patient with bridging for leg DVT after coumadin reversal for myelogram.  INR today is 1.18. CBC low but stable.  Heparin level is subtherapeutic at 0.16 this morning. No problems with infusion or bleeding issues noted.   Goal of Therapy:  Heparin level 0.3-0.7 units/ml Monitor platelets by anticoagulation protocol: Yes   Plan:  Increase heparin to 2200 units/hr Give heparin 2000 unit bolus Check anti-Xa level in 8 hours and daily while on heparin Continue to monitor H&H and platelets   Thank you for allowing [place] to participate in this patients care.  Jens Som, PharmD Main pharmacy at: [phone] 05/13/2018 3:36 AM

## 2018-05-13 NOTE — Progress Notes (Signed)
ANTICOAGULATION CONSULT NOTE   Pharmacy Consult for heparin Indication: bridging for leg DVT after Coumadin reversal for myelogram  No Known Allergies  Patient Measurements: Height: 6\' 3"  (190.5 cm) Weight: 252 lb 13.9 oz (114.7 kg) IBW/kg (Calculated) : 84.5 Heparin Dosing Weight: 111.4 kg  Vital Signs: Temp: 98.1 F (36.7 C) (05/25 0831) Temp Source: Oral (05/25 0831) BP: 139/88 (05/25 0831) Pulse Rate: 77 (05/25 0831)  Labs: Recent Labs    05/11/18 0659 05/11/18 1953  05/12/18 0522 05/12/18 1900 05/13/18 0236 05/13/18 1406  HGB 9.4*  --   --  9.1*  --  8.9*  --   HCT 33.0*  --   --  31.8*  --  31.7*  --   PLT 579*  --   --  589*  --  552*  --   LABPROT 29.4* 18.4*  --  16.1*  --  14.9  --   INR 2.81 1.54  --  1.30  --  1.18  --   HEPARINUNFRC  --   --    < > <0.10* <0.10* 0.16* 0.27*  CREATININE  --   --   --  0.64  --  0.77  --   CKTOTAL 15*  --   --   --   --   --   --    < > = values in this interval not displayed.    Estimated Creatinine Clearance: 115.7 mL/min (by C-G formula based on SCr of 0.77 mg/dL).   Assessment: Pharmacy consulted to do heparin for patient with bridging for leg DVT after coumadin reversal for myelogram.  INR today is 1.18. CBC low but stable.  Heparin level is subtherapeutic at 0.27 this afternoon. No problems with infusion or bleeding issues noted.   Goal of Therapy:  Heparin level 0.3-0.7 units/ml Monitor platelets by anticoagulation protocol: Yes   Plan:  Increase heparin to 2400 units/hr Give heparin 1000 unit bolus Check anti-Xa level in 8 hours and daily while on heparin Continue to monitor H&H and platelets   Thank you for allowing [place] to participate in this patients care.  Lulla Linville A. Levada Dy, PharmD, [place] Pager: [phone]  05/13/2018 2:54 PM

## 2018-05-13 NOTE — Progress Notes (Addendum)
[place] for heparin/warfarin bridge Indication: bridging for leg DVT   No Known Allergies  Patient Measurements: Height: 6\' 3"  (190.5 cm) Weight: 252 lb 13.9 oz (114.7 kg) IBW/kg (Calculated) : 84.5 Heparin Dosing Weight: 111.4 kg  Vital Signs: Temp: 98.1 F (36.7 C) (05/25 0831) Temp Source: Oral (05/25 0831) BP: 139/88 (05/25 0831) Pulse Rate: 77 (05/25 0831)  Labs: Recent Labs    05/10/18 0933  05/11/18 0659 05/11/18 1953 05/12/18 0522 05/12/18 1900 05/13/18 0236  HGB 9.4*  --  9.4*  --  9.1*  --  8.9*  HCT 33.0*  --  33.0*  --  31.8*  --  31.7*  PLT 532*  --  579*  --  589*  --  552*  LABPROT  --    < > 29.4* 18.4* 16.1*  --  14.9  INR  --    < > 2.81 1.54 1.30  --  1.18  HEPARINUNFRC  --   --   --   --  <0.10* <0.10* 0.16*  CREATININE 0.74  --   --   --  0.64  --  0.77  CKTOTAL  --   --  15*  --   --   --   --    < > = values in this interval not displayed.    Estimated Creatinine Clearance: 115.7 mL/min (by C-G formula based on SCr of 0.77 mg/dL).   Assessment: Pharmacy consulted to do heparin for patient with bridging for leg DVT after coumadin reversal for myelogram. Myelogram completed and now day 1 bridging heparin/warfarin, INR today is 1.18. CBC low but stable.  Heparin level is subtherapeutic at 0.16 this morning. No problems with infusion or bleeding issues noted. Warfarin restarted today, 5/25, INR 1.18. Good PO intake. DDI noted for warfarin with rifampin and ketorolac.  PTA warfarin: 7.5mg  STWTS, 10 mg M,F   Goal of Therapy:  INR 2-3 Heparin level 0.3-0.7 units/ml Monitor platelets by anticoagulation protocol: Yes   Plan:  Increase heparin to 2200 units/hr F/u heparin level at 1400 Warfarin 7.5 mg PO x 1 Daily INR, CBCs F/u s/sx bleeding, PO intake, DDI   Nida Boatman, PharmD PGY1 Acute Care Pharmacy Resident 05/13/2018 8:44 AM

## 2018-05-13 NOTE — Progress Notes (Addendum)
Physical Therapy Treatment Patient Details Name: Cody Velez MRN: [id number] DOB: 1946/08/12 Today's Date: 05/13/2018    History of Present Illness Cody Velez is a 72 y.o. male with medical history significant of obstructive sleep apnea, hypertension, obesity, chronic back pain with multiple 3 previous surgeries who recently had an epidural abscess causing paraplegia surgically decompressed on March 28, 2018 has been on 8 weeks duration of cefazolin and rifampin until May 22, 2018.  Patient has been in short-term rehab and was just discharged this past Saturday.  He went home and since being home he is fallen several times.  Family brought him back in due to his frequent falls and the need for further rehab versus skilled nursing facility placement. Patient is being admitted again further evaluation for more rehabilitation. Per Neurology - consistent with a slowly recovering myelopathy patient.    PT Comments    Pt making minor progress since previous sessions this admission. Pt able to performed bed mobility with mod A and stood from EOB with STEDY and mod A x2. Session limited as pt needing to use BSC to have a BM. Pt would continue to benefit from skilled physical therapy services at this time while admitted and after d/c to address the below listed limitations in order to improve overall safety and independence with functional mobility.    Follow Up Recommendations  SNF;Supervision for mobility/OOB     Equipment Recommendations  None recommended by PT    Recommendations for Other Services       Precautions / Restrictions Precautions Precautions: Fall;Back Required Braces or Orthoses: Spinal Brace Knee Immobilizer - Right: On when out of bed or walking Spinal Brace: Thoracolumbosacral orthotic Restrictions Weight Bearing Restrictions: No    Mobility  Bed Mobility Overal bed mobility: Needs Assistance Bed Mobility: Rolling;Sidelying to Sit Rolling: Mod  assist Sidelying to sit: Mod assist       General bed mobility comments: assist with bilateral LE movement off of bed and trunk elevation with HOB elevated  Transfers Overall transfer level: Needs assistance   Transfers: Sit to/from Stand Sit to Stand: Mod assist;+2 safety/equipment;+2 physical assistance         General transfer comment: pt able to use STEDY with mod A x2 to stand x1 from bed; pt transferred to [facility]  Ambulation/Gait                 Stairs             Wheelchair Mobility    Modified Rankin (Stroke Patients Only)       Balance Overall balance assessment: Needs assistance Sitting-balance support: Feet supported Sitting balance-Leahy Scale: Fair     Standing balance support: During functional activity;Bilateral upper extremity supported Standing balance-Leahy Scale: Poor                              Cognition Arousal/Alertness: Awake/alert Behavior During Therapy: WFL for tasks assessed/performed Overall Cognitive Status: Within Functional Limits for tasks assessed                                        Exercises      General Comments        Pertinent Vitals/Pain Pain Assessment: No/denies pain    Home Living  Prior Function            PT Goals (current goals can now be found in the care plan section) Acute Rehab PT Goals PT Goal Formulation: With patient/family Time For Goal Achievement: 05/26/18 Potential to Achieve Goals: Good Progress towards PT goals: Progressing toward goals    Frequency    Min 2X/week      PT Plan Frequency needs to be updated    Co-evaluation              AM-PAC PT "6 Clicks" Daily Activity  Outcome Measure  Difficulty turning over in bed (including adjusting bedclothes, sheets and blankets)?: Unable Difficulty moving from lying on back to sitting on the side of the bed? : Unable Difficulty sitting down on and  standing up from a chair with arms (e.g., wheelchair, bedside commode, etc,.)?: Unable Help needed moving to and from a bed to chair (including a wheelchair)?: A Lot Help needed walking in hospital room?: Total Help needed climbing 3-5 steps with a railing? : Total 6 Click Score: 7    End of Session Equipment Utilized During Treatment: Gait belt Activity Tolerance: Patient tolerated treatment well Patient left: with call bell/phone within reach;with nursing/sitter in room;with family/visitor present;Other (comment)(on BSC) Nurse Communication: Mobility status;Need for lift equipment PT Visit Diagnosis: Unsteadiness on feet (R26.81);Other abnormalities of gait and mobility (R26.89);Muscle weakness (generalized) (M62.81)     Time: 1610-9604 PT Time Calculation (min) (ACUTE ONLY): 10 min  Charges:  $Therapeutic Activity: 8-22 mins                    G Codes:       Cody Velez, [place], [place] 05/13/2018, 1:29 PM

## 2018-05-14 LAB — CBC
HCT: 32.5 % — ABNORMAL LOW (ref 39.0–52.0)
Hemoglobin: 9.2 g/dL — ABNORMAL LOW (ref 13.0–17.0)
MCH: 23.2 pg — ABNORMAL LOW (ref 26.0–34.0)
MCHC: 28.3 g/dL — ABNORMAL LOW (ref 30.0–36.0)
MCV: 82.1 fL (ref 78.0–100.0)
Platelets: 617 10*3/uL — ABNORMAL HIGH (ref 150–400)
RBC: 3.96 MIL/uL — ABNORMAL LOW (ref 4.22–5.81)
RDW: 16.7 % — ABNORMAL HIGH (ref 11.5–15.5)
WBC: 7.8 10*3/uL (ref 4.0–10.5)

## 2018-05-14 LAB — HEPARIN LEVEL (UNFRACTIONATED): Heparin Unfractionated: 0.38 IU/mL (ref 0.30–0.70)

## 2018-05-14 LAB — PROTIME-INR
INR: 1.19
Prothrombin Time: 15 seconds (ref 11.4–15.2)

## 2018-05-14 MED ORDER — WARFARIN SODIUM 7.5 MG PO TABS
7.5000 mg | ORAL_TABLET | Freq: Once | ORAL | Status: AC
  Administered 2018-05-14: 7.5 mg via ORAL
  Filled 2018-05-14: qty 1

## 2018-05-14 NOTE — Progress Notes (Signed)
Occupational Therapy Re-evaluation  Patient Details Name: Cody Velez MRN: [id number] DOB: 15-Jan-1946 Today's Date: 05/14/2018    History of present illness Cody Velez is a 72 y.o. male with medical history significant of obstructive sleep apnea, hypertension, obesity, chronic back pain with multiple 3 previous surgeries who recently had an epidural abscess causing paraplegia surgically decompressed on March 28, 2018 has been on 8 weeks duration of cefazolin and rifampin until May 22, 2018.  Patient has been in short-term rehab and was just discharged this past Saturday.  He went home and since being home he is fallen several times.  Family brought him back in due to his frequent falls and the need for further rehab versus skilled nursing facility placement. Patient is being admitted again further evaluation for more rehabilitation. Per Neurology - consistent with a slowly recovering myelopathy patient.    OT comments  Pt seen for OT re-evaluation s/p transfer to [facility]. Pt in good spirits on OT arrival and highly motivated to participate in ADL tasks. He was able to complete bed mobility to sit at EOB with overall min-mod assist and participate in LB dressing tasks with max assist. Facilitated transfer from bed to wheelchair using Stedy with mod assist +2 for safety in preparation for mobility to sink for grooming tasks. Pt is able to complete grooming tasks at sink at wheelchair level with set-up. Feel that pt remains an excellent candidate for CIR level therapies post-acute D/C. OT will continue to follow while admitted. Updated OT goals to reflect current progress.   Follow Up Recommendations  CIR;Supervision/Assistance - 24 hour    Equipment Recommendations  Hospital bed    Recommendations for Other Services Rehab consult    Precautions / Restrictions Precautions Precautions: Fall;Back Precaution Booklet Issued: No Precaution Comments: Pt able to recall 3/3 back  precautions with minimal cues.  Required Braces or Orthoses: Spinal Brace Knee Immobilizer - Right: On when out of bed or walking Spinal Brace: Thoracolumbosacral orthotic Restrictions Weight Bearing Restrictions: No Other Position/Activity Restrictions: sit upright in chair for spinal protection       Mobility Bed Mobility Overal bed mobility: Needs Assistance Bed Mobility: Rolling;Sidelying to Sit Rolling: Mod assist Sidelying to sit: Min assist       General bed mobility comments: Assist to roll with cues for log roll technique. She was able to power up from [facility] with min assist.   Transfers Overall transfer level: Needs assistance   Transfers: Sit to/from Stand Sit to Stand: Mod assist;+2 safety/equipment         General transfer comment: Mod assist from OT to power up to standing position within [place] frame. Pt's girlfriend assisting with placement of flaps behind hips and steadying w/c during transfer.     Balance Overall balance assessment: Needs assistance Sitting-balance support: Feet supported Sitting balance-Leahy Scale: Fair     Standing balance support: During functional activity;Bilateral upper extremity supported Standing balance-Leahy Scale: Poor Standing balance comment: Relies on external assistance and support from [place] frame.                            ADL either performed or assessed with clinical judgement   ADL Overall ADL's : Needs assistance/impaired Eating/Feeding: Set up;Sitting   Grooming: Supervision/safety;Sitting;Wash/dry face Grooming Details (indicate cue type and reason): able to shave at sink at wheelchair level with set-up Upper Body Bathing: Minimal assistance;Sitting   Lower Body Bathing: Moderate assistance;Sitting/lateral leans  Upper Body Dressing : Maximal assistance;Sitting Upper Body Dressing Details (indicate cue type and reason): including brace Lower Body Dressing: Sitting/lateral leans;Maximal  assistance   Toilet Transfer: Moderate assistance;+2 for safety/equipment(with stedy) Toilet Transfer Details (indicate cue type and reason): Able to pull self up to standing with Stedy frame and sustain standing for long enough to move flaps below hips to sit on Stedy.  Toileting- Clothing Manipulation and Hygiene: Total assistance;Sit to/from stand;+2 for safety/equipment         General ADL Comments: Pt able to complete dressing tasks at EOB, utilize Stedy with mod assist +2 for safety to transfer to wheelchair in preparation for grooming tasks at sink at wheelchair level. Girlfriend present and engaged in session.      Vision Baseline Vision/History: Wears glasses Wears Glasses: Reading only Vision Assessment?: No apparent visual deficits   Perception     Praxis      Cognition Arousal/Alertness: Awake/alert Behavior During Therapy: WFL for tasks assessed/performed Overall Cognitive Status: Within Functional Limits for tasks assessed                                          Exercises     Shoulder Instructions       General Comments      Pertinent Vitals/ Pain       Pain Assessment: Faces Faces Pain Scale: Hurts little more Pain Location: Rt knee spasms; headache Pain Descriptors / Indicators: Shooting;Spasm;Headache Pain Intervention(s): Limited activity within patient's tolerance;Monitored during session;Repositioned;Patient requesting pain meds-RN notified(requesting meds for headache)  Home Living Family/patient expects to be discharged to:: Inpatient rehab Living Arrangements: Spouse/significant other(girlfriend) Available Help at Discharge: Friend(s);Available 24 hours/day Type of Home: House Home Access: Ramped entrance     Home Layout: Able to live on main level with bedroom/bathroom;Multi-level Alternate Level Stairs-Number of Steps: 14 Alternate Level Stairs-Rails: Right Bathroom Shower/Tub: Teacher, early years/pre:  Standard Bathroom Accessibility: Yes   Home Equipment: Walker - 2 wheels;Shower seat;Bedside commode;Cane - single point;Wheelchair - manual;Tub bench   Additional Comments: drop arm BSC, 30" slide board, wheelchair is a rental until he receives his.  Lives With: Significant other    Prior Functioning/Environment Level of Independence: Needs assistance  Gait / Transfers Assistance Needed: When leaving CIR was able to take some steps with RW. Primarily utilized wheelchair and was able to transfer with supervision to and from w/c and self-propel.  ADL's / Homemaking Assistance Needed: Using AE was abel to complete with slight assistance from family when leaving CIR.        Frequency  Min 2X/week        Progress Toward Goals  OT Goals(current goals can now be found in the care plan section)     Acute Rehab OT Goals Patient Stated Goal: return to rehab OT Goal Formulation: With patient/family Time For Goal Achievement: 05/28/18 Potential to Achieve Goals: Good ADL Goals Pt Will Perform Grooming: sitting;with modified independence Pt Will Perform Upper Body Dressing: with modified independence;sitting Pt Will Perform Lower Body Dressing: with modified independence;sitting/lateral leans;with adaptive equipment Pt Will Transfer to Toilet: with modified independence;squat pivot transfer;bedside commode Pt Will Perform Toileting - Clothing Manipulation and hygiene: with modified independence;sitting/lateral leans  Plan      Co-evaluation                 AM-PAC PT "6 Clicks" Daily Activity  Outcome Measure   Help from another person eating meals?: None Help from another person taking care of personal grooming?: A Little Help from another person toileting, which includes using toliet, bedpan, or urinal?: A Lot Help from another person bathing (including washing, rinsing, drying)?: A Lot Help from another person to put on and taking off regular upper body clothing?: A  Little Help from another person to put on and taking off regular lower body clothing?: A Lot 6 Click Score: 16    End of Session Equipment Utilized During Treatment: Back brace(wheelchair (manual) and Stedy)  OT Visit Diagnosis: Muscle weakness (generalized) (M62.81);Other abnormalities of gait and mobility (R26.89)   Activity Tolerance Patient tolerated treatment well   Patient Left with family/visitor present(in wheelchair completing grooming tasks at sink)   Nurse Communication Mobility status;Other (comment)(pt up in wheelchair; transfer recommendations)        Time: 2518-9842 OT Time Calculation (min): 41 min  Charges: OT General Charges $OT Visit: 1 Visit OT Evaluation $OT Re-eval: (P) 1 Re-eval OT Treatments $Self Care/Home Management : 23-37 mins  Norman Herrlich, MS OTR/L  Pager: [place] A Schyler Counsell 05/14/2018, 12:55 PM

## 2018-05-14 NOTE — Progress Notes (Signed)
ANTICOAGULATION CONSULT NOTE   Pharmacy Consult for heparin Indication: DVT  No Known Allergies  Patient Measurements: Height: 6\' 3"  (190.5 cm) Weight: 252 lb 13.9 oz (114.7 kg) IBW/kg (Calculated) : 84.5 Heparin Dosing Weight: 111.4 kg  Vital Signs: Temp: 98 F (36.7 C) (05/26 0013) Temp Source: Oral (05/26 0013) BP: 136/86 (05/26 0013) Pulse Rate: 74 (05/26 0013)  Labs: Recent Labs    05/11/18 0659 05/11/18 1953 05/12/18 0522  05/13/18 0236 05/13/18 1406 05/13/18 2320  HGB 9.4*  --  9.1*  --  8.9*  --   --   HCT 33.0*  --  31.8*  --  31.7*  --   --   PLT 579*  --  589*  --  552*  --   --   LABPROT 29.4* 18.4* 16.1*  --  14.9  --   --   INR 2.81 1.54 1.30  --  1.18  --   --   HEPARINUNFRC  --   --  <0.10*   < > 0.16* 0.27* 0.43  CREATININE  --   --  0.64  --  0.77  --   --   CKTOTAL 15*  --   --   --   --   --   --    < > = values in this interval not displayed.    Estimated Creatinine Clearance: 115.7 mL/min (by C-G formula based on SCr of 0.77 mg/dL).   Assessment: 72 y.o. male with DVT for heparin  Goal of Therapy:  Heparin level 0.3-0.7 units/ml Monitor platelets by anticoagulation protocol: Yes   Plan:  Continue Heparin at current rate  Phillis Knack, PharmD, BCPS   05/14/2018 1:20 AM

## 2018-05-14 NOTE — Progress Notes (Signed)
ANTICOAGULATION CONSULT NOTE   Pharmacy Consult for heparin Indication: bridging for leg DVT after Coumadin reversal for myelogram  No Known Allergies  Patient Measurements: Height: 6\' 3"  (190.5 cm) Weight: 252 lb 13.9 oz (114.7 kg) IBW/kg (Calculated) : 84.5 Heparin Dosing Weight: 111.4 kg  Vital Signs: Temp: 98 F (36.7 C) (05/26 0733) Temp Source: Oral (05/26 0733) BP: 147/89 (05/26 0733) Pulse Rate: 71 (05/26 0733)  Labs: Recent Labs    05/12/18 0522  05/13/18 0236 05/13/18 1406 05/13/18 2320 05/14/18 0408  HGB 9.1*  --  8.9*  --   --  9.2*  HCT 31.8*  --  31.7*  --   --  32.5*  PLT 589*  --  552*  --   --  617*  LABPROT 16.1*  --  14.9  --   --  15.0  INR 1.30  --  1.18  --   --  1.19  HEPARINUNFRC <0.10*   < > 0.16* 0.27* 0.43 0.38  CREATININE 0.64  --  0.77  --   --   --    < > = values in this interval not displayed.    Estimated Creatinine Clearance: 115.7 mL/min (by C-G formula based on SCr of 0.77 mg/dL).   Assessment: Pharmacy consulted to do heparin for patient with bridging for leg DVT after coumadin reversal for myelogram.  INR today is 1.19. CBC stable.  Heparin level is therapeutic at 0.38 this morning. No problems with infusion or bleeding issues noted.   Goal of Therapy:  Heparin level 0.3-0.7 units/ml  INR 2-3 Monitor platelets by anticoagulation protocol: Yes   Plan:  Continue heparin at 2400 units/hr Coumadin 7.5mg  x 1 tonight Check anti-Xa level  daily while on heparin Continue to monitor H&H and platelets   Thank you for allowing [place] to participate in this patients care.  Kieren Adkison A. Levada Dy, PharmD, [place] Pager: [phone]  05/14/2018 10:46 AM

## 2018-05-14 NOTE — Progress Notes (Signed)
[facility]                                                                              Patient Demographics  Cody Velez, is a 72 y.o. male, DOB - 03/11/1946, JSH:[id number]  Admit date - 05/08/2018   Admitting Physician Cody Grout, MD  Outpatient Primary MD for the patient is Cody Noble, MD  Outpatient specialists:   LOS - 3  days   Medical records reviewed and are as summarized below:    Chief Complaint  Patient presents with  . Fall       Brief summary   Patient is a 72 year old male with hypertension, hyperlipidemia, morbid obesity, cad, chronic back pain status post spinal stimulator(C-spine) placed 1/30/19presented withincreasing mechanical falls at home. Patient was recently discharged from [place] a stay from 03/30/2018 through 05/06/2018.  The patient was initially admitted in early April 2019, found to have MSSA bacteremia. Extensive work-up at that time revealed epidural abscess and T10-11 osteomyelitis and discitis, underwent laminectomy, decompression of epidural abscess, and methacrylate screw augmentation on 03/28/2018 performed by Dr. Ellene Velez.The patient was followed by infectious disease. They recommended cefazolin and rifampin for 8 weeks to be completed on 05/22/2018. He was subsequently discharged to inpatient rehabilitation on 03/30/2018.  Patient was making significant progress at that time. However since discharge home, the patient has noted increasing lower extremity weakness and increasing falling.  Patient was transferred to [facility] for myelogram and neurosurgery evaluation.     Assessment & Plan   Bilateral lower extremity weakness, falls -Feeling somewhat better today, recently finished rehab, interested in CIR again  -CT cervical, thoracic, lumbar spine 5/22 showed new endplate erosion I50-27 with a increased paravertebral inflammation, concern for septic arthritis -Myelogram completed on 5/24, reviewed by  Dr. Ellene Velez per neurosurgery, patient does not have evidence of canal compromise.,  Spinal cord compression epidural collections or neuro compromise from an anatomic basis. - Recommended neurology consult, seen by Dr. Orlena Velez, recommended PT OT evaluation for rehab -PT evaluation recommended CIR, consult placed  Epidural abscess/vertebral osteomyelitis/discitis -Blood cultures had grown MSSA previously,  - continue IV cefazolin, rifampin, tentative stop date on 05/22/2018 -Status post I&D and laminectomy on 4/9  Acute DVT right peroneal vein, left soleal vein -Continue IV heparin bridge, restarted warfarin, INR 1.19  Essential hypertension Stable, continue amlodipine, lisinopril  Chronic pain syndrome -Continue gabapentin  Depression Continue home dose of venlafaxine  Hyperlipidemia Continue statin  Coronary artery disease Currently no symptoms, no chest pain or shortness of breath  Cognitive impairment, possible underlying dementia Continue Aricept, Namenda  Code Status: Full code DVT Prophylaxis: Heparin plus Coumadin Family Communication: Discussed in detail with the patient, all imaging results, lab results explained to the patient and wife at the bedside   Disposition Plan: Pending bed available at CIR  Time Spent in minutes 25 minutes   Procedures:  Myelogram CT spine  Consultants:   Neurology Neurosurgery  Antimicrobials:      Medications  Scheduled Meds: . amLODipine  10 mg Oral Daily  . aspirin  81 mg Oral Daily  . docusate sodium  200 mg Oral BID  .  donepezil  10 mg Oral QHS  . finasteride  5 mg Oral Daily  . gabapentin  400 mg Oral TID  . latanoprost  1 drop Both Eyes QHS  . lidocaine (PF)  5 mL Other Once  . linaclotide  290 mcg Oral QAC breakfast  . lisinopril  5 mg Oral Daily  . Melatonin  1.5 mg Oral QHS  . memantine  10 mg Oral BID  . methocarbamol  500 mg Oral QID  . pantoprazole  40 mg Oral Daily  . polyethylene glycol  17 g Oral  Daily  . potassium chloride SA  20 mEq Oral Daily  . pravastatin  10 mg Oral QPM  . rifampin  300 mg Oral Q12H  . sodium chloride flush  3 mL Intravenous Q12H  . traZODone  100 mg Oral QHS  . venlafaxine XR  150 mg Oral BID  . vitamin B-12  1,000 mcg Oral Daily  . warfarin  7.5 mg Oral ONCE-1800  . Warfarin - Pharmacist Dosing Inpatient   Does not apply q1800   Continuous Infusions: . sodium chloride 250 mL (05/13/18 0556)  .  ceFAZolin (ANCEF) IV Stopped (05/14/18 1136)  . heparin 2,400 Units/hr (05/14/18 1031)   PRN Meds:.sodium chloride, fluticasone, ketorolac, oxyCODONE, sodium chloride flush   Antibiotics   Anti-infectives (From admission, onward)   Start     Dose/Rate Velez Frequency Ordered Stop   05/09/18 0100  ceFAZolin (ANCEF) IVPB 2g/100 mL premix     2 g 200 mL/hr over 30 Minutes Intravenous Every 8 hours 05/08/18 2213 05/22/18 1659   05/08/18 2200  rifampin (RIFADIN) capsule 300 mg    Note to Pharmacy:  Continue through 05/22/2018 and stop     300 mg Oral Every 12 hours 05/08/18 2032 05/22/18 2159   05/08/18 2200  ceFAZolin (ANCEF) IVPB  Status:  Discontinued    Note to Pharmacy:  Indication:  MSSA Bacteremia and extensive epidural abscess discitis of thoracic spine Last Day of Therapy: 05/22/2018 Labs - Once weekly:  CBC/D and BMP, Labs - Every other week:  ESR and CRP     2 g Intravenous Every 8 hours 05/08/18 2032 05/08/18 2211   05/08/18 1530  ceFAZolin (ANCEF) 3 g in dextrose 5 % 50 mL IVPB     3 g 100 mL/hr over 30 Minutes Intravenous  Once 05/08/18 1521 05/08/18 1726        Subjective:   Cody Velez was seen and examined today.  No acute complaints, working with physical therapy, feels better.  Patient denies dizziness, chest pain, shortness of breath, abdominal pain, N/V/D/C. No acute events overnight.    Objective:   Vitals:   05/13/18 2000 05/14/18 0013 05/14/18 0733 05/14/18 1233  BP: 127/80 136/86 (!) 147/89 125/64  Pulse: 86 74 71 87    Resp: '18 20 16 17  ' Temp: 98.9 F (37.2 C) 98 F (36.7 C) 98 F (36.7 C) 97.8 F (36.6 C)  TempSrc: Oral Oral Oral Oral  SpO2: 98% 98% 99% 99%  Weight:      Height:        Intake/Output Summary (Last 24 hours) at 05/14/2018 1332 Last data filed at 05/14/2018 0900 Gross per 24 hour  Intake 240 ml  Output 700 ml  Net -460 ml     Wt Readings from Last 3 Encounters:  05/12/18 114.7 kg (252 lb 13.9 oz)  05/06/18 125 kg (275 lb 9.2 oz)  03/27/18 130.4 kg (287 lb 7.7 oz)  Exam   General: Alert and oriented x 3, NAD  Eyes:   HEENT:    Cardiovascular: S1 S2 auscultated,  Regular rate and rhythm. No pedal edema b/l  Respiratory: Clear to auscultation bilaterally, no wheezing, rales or rhonchi  Gastrointestinal: Soft, nontender, nondistended, + bowel sounds  Ext: no pedal edema bilaterally  Neuro: no new deficits  Musculoskeletal: No digital cyanosis, clubbing  Skin: No rashes  Psych: Normal affect and demeanor, alert and oriented x3    Data Reviewed:  I have personally reviewed following labs and imaging studies  Micro Results No results found for this or any previous visit (from the past 240 hour(s)).  Radiology Reports Dg Chest 1 View  Result Date: 04/25/2018 CLINICAL DATA:  Possible malfunction of the right-sided PICC line. EXAM: CHEST  1 VIEW COMPARISON:  Chest x-ray dated March 17, 2018 FINDINGS: The right-sided PICC line tip terminates at the junction of the right and left brachiocephalic veins. The lungs are well-expanded. The interstitial markings are increased. There is no alveolar infiltrate or significant pleural effusion. The heart is normal in size. The pulmonary vascularity is not engorged. There is calcification in the wall of the thoracic aorta. The patient has undergone previous mid and distal thoracic spinal fusion. There are nerve stimulator electrodes present which terminate in the lower cervical region. IMPRESSION: COPD. Mild interstitial  prominence may reflect pulmonary fibrosis or low-grade interstitial edema. Thoracic aortic atherosclerosis. Electronically Signed   By: David  Martinique M.D.   On: 04/25/2018 13:52   Dg Lumbar Spine 2-3 Views  Result Date: 05/08/2018 CLINICAL DATA:  Low back pain after a fall. EXAM: LUMBAR SPINE - 2-3 VIEW COMPARISON:  Spot fluoro films from 03/28/2018. FINDINGS: Patient is status post extensive thoracolumbar fusion posteriorly with incomplete visualization of the thoracolumbar fusion hardware. Image portions of the hardware show no complicating features. Interbody cages are seen at L4-5 and L5-S1. Bones are diffusely demineralized. Although no definite compression deformity is evident, lateral film is limited in utility given the demineralization. IMPRESSION: No evidence for an acute fracture on this limited two-view study. Electronically Signed   By: Misty Stanley M.D.   On: 05/08/2018 16:58   Dg Knee 1-2 Views Left  Result Date: 05/08/2018 CLINICAL DATA:  [place] Saturday landing on bilateral knees. EXAM: RIGHT KNEE - 1-2 VIEW; LEFT KNEE - 1-2 VIEW COMPARISON:  MRI of the LEFT knee November 14, 2009 FINDINGS: LEFT: No acute fracture deformity or dislocation. Moderate to severe tricompartmental joint space narrowing with periarticular sclerosis and marginal spurring. Osteopenia without destructive bony lesions. Small suprapatellar joint effusion. Mild vascular calcifications. RIGHT: No acute fracture deformity dislocation. Moderate tricompartmental joint space narrowing with periarticular sclerosis and marginal spurring. No destructive bony lesions. Osteopenia. Soft tissue planes are nonsuspicious. IMPRESSION: 1. No acute fracture deformity dislocation. Small LEFT suprapatellar joint effusion. 2. Moderate RIGHT and moderate to severe LEFT tricompartmental osteoarthrosis. Electronically Signed   By: Elon Alas M.D.   On: 05/08/2018 16:55   Dg Knee 1-2 Views Right  Result Date: 05/08/2018 CLINICAL DATA:   [place] landing on bilateral knees. EXAM: RIGHT KNEE - 1-2 VIEW; LEFT KNEE - 1-2 VIEW COMPARISON:  MRI of the LEFT knee November 14, 2009 FINDINGS: LEFT: No acute fracture deformity or dislocation. Moderate to severe tricompartmental joint space narrowing with periarticular sclerosis and marginal spurring. Osteopenia without destructive bony lesions. Small suprapatellar joint effusion. Mild vascular calcifications. RIGHT: No acute fracture deformity dislocation. Moderate tricompartmental joint space narrowing with periarticular sclerosis and marginal  spurring. No destructive bony lesions. Osteopenia. Soft tissue planes are nonsuspicious. IMPRESSION: 1. No acute fracture deformity dislocation. Small LEFT suprapatellar joint effusion. 2. Moderate RIGHT and moderate to severe LEFT tricompartmental osteoarthrosis. Electronically Signed   By: Elon Alas M.D.   On: 05/08/2018 16:55   Ct Cervical Spine Wo Contrast  Result Date: 05/12/2018 CLINICAL DATA:  Worsening paraplegia.  History of spinal infection. FLUOROSCOPY TIME:  1 minutes and 40 seconds.  Fifteen spot films. PROCEDURE: LUMBAR PUNCTURE FOR CERVICAL LUMBAR AND THORACIC MYELOGRAM CERVICAL AND LUMBAR AND THORACIC MYELOGRAM CT CERVICAL MYELOGRAM CT LUMBAR MYELOGRAM CT THORACIC MYELOGRAM After thorough discussion of risks and benefits of the procedure including bleeding, infection, injury to nerves, blood vessels, adjacent structures as well as headache and CSF leak, written and oral informed consent was obtained. Consent was obtained by Dr. Rolla Flatten. Patient was positioned prone on the fluoroscopy table. Local anesthesia was provided with 1% lidocaine without epinephrine after prepped and draped in the usual sterile fashion. Puncture was performed at L2-L3 using a 3 1/2 inch 20 gauge spinal needle via midline approach. Using a single pass through the dura, the needle was placed within the thecal sac, with return of clear CSF. 10 mL Isovue M-300  was injected into the thecal sac, with normal opacification of the nerve roots and cauda equina consistent with free flow within the subarachnoid space. The patient was then moved to the trendelenburg position and contrast flowed into the Thoracic and Cervical spine regions. I personally performed the lumbar puncture and administered the intrathecal contrast. I also personally supervised acquisition of the myelogram images. TECHNIQUE: Contiguous axial images were obtained through the Cervical, Thoracic, and Lumbar spine after the intrathecal infusion of infusion. Coronal and sagittal reconstructions were obtained of the axial image sets. FINDINGS: TOTAL MYELOGRAM FINDINGS: Good opacification lumbar subarachnoid space. No stenosis or blockage. Hardware intact. Patient was placed in lateral decubitus and contrast maneuvered into the thoracic region where no block was evident. Patient was further tilted down in contrast maneuvered into the cervical region where no block was present. Dorsal column stimulator leads spanned the cervicothoracic junction. Patient was escorted to CT scanning for further evaluation. CT CERVICAL MYELOGRAM FINDINGS: Alignment: Anatomic except for mild facet mediated slip C2-C3, adjacent segment. Vertebrae: No worrisome osseous lesion.  Solid arthrodesis C3-C6. Cord: Dorsal column stimulator terminating at C4. No cord compression. Paraspinal tissues: Unremarkable. Disc levels: Unchanged disc and facet degeneration compared to last month study. Uncovertebral and facet spurring result in multilevel neural foraminal stenosis moderate to severe on the LEFT at C2-3 and moderate to severe on the RIGHT at C3-4. CT LUMBAR MYELOGRAM FINDINGS: Segmentation: Normal. Alignment:  Normal. Vertebrae: No worrisome osseous lesion.L1-2 S1 fusion appears solid. Conus medullaris: Normal in size and location. Paraspinal tissues: No evidence for hydronephrosis or paravertebral mass. Disc levels: Unchanged osseous  neural foraminal stenosis at multiple levels, moderate on the LEFT at L5-S1. No significant spinal stenosis. CT THORACIC MYELOGRAM FINDINGS: Alignment: 4 mm anterolisthesis T10 on T11, new/increased from prior. Vertebrae: Erosive endplate changes at U38-45, without disc space collapse. T8-L1 fusion posteriorly. Pedicle screws in good position. Paraspinal tissues: Phlegmonous changes from T9 through T12 greater on the RIGHT. Small pleural effusion.Cord: No cord compression at any level. No significant ventral epidural process at T10-11. Cord: No cord compression, cord enlargement, or significant ventral epidural process at any level. Disc levels: Unchanged severe disc space narrowing and degenerative change at T2-T3. Interval T10-T11 posterior decompression. IMPRESSION: Total myelogram with cervical, thoracic  and lumbar postmyelogram CT demonstrates no compressive lesion of the cord at any level. Spinal stimulator device, along with multilevel multisegment fusion hardware appear appropriately placed without adverse features. There is probably some residual infection and paravertebral phlegmon at the T10-11 interspace, but there is no ventral epidural hematoma. Electronically Signed   By: Staci Righter M.D.   On: 05/12/2018 17:38   Ct Cervical Spine W Contrast  Result Date: 05/11/2018 CLINICAL DATA:  History of discitis-osteomyelitis with epidural abscess at T10-11. Status post T10-11 decompression and debridement with thoracolumbar fusion on 03/28/2018. Worsening lower extremity weakness. EXAM: CT CERVICAL, THORACIC, AND LUMBAR SPINE WITH CONTRAST TECHNIQUE: Multidetector CT imaging of the cervical, lumbar, and thoracic spine was performed with intravenous contrast. Multiplanar CT image reconstructions were also generated. CONTRAST:  149m ISOVUE-300 IOPAMIDOL (ISOVUE-300) INJECTION 61% COMPARISON:  Cervical spine CT 03/22/2018. Thoracic and lumbar spine CT 03/20/2018. FINDINGS: CT CERVICAL SPINE FINDINGS  Alignment: Unchanged cervical spine straightening and trace anterolisthesis of C2 on C3. Skull base and vertebrae: No acute fracture. No interval osseous erosion to suggest infectious discitis-osteomyelitis. Solid C3-C6 ACDF. Soft tissues and spinal canal: Unchanged dorsal column stimulator terminating at the C4 level. No gross epidural fluid collection, however artifact from the stimulator and fusion plate and screws limits assessment. No evidence of significant paraspinal or prevertebral inflammatory changes or fluid collection. Disc levels: Unchanged disc and facet degeneration compared to last month's study. Uncovertebral and facet spurring again result in multilevel neural foraminal stenosis, moderate to severe on the left at C2-3 and on the right at C3-4. Upper chest: Partially visualized right PICC. Aortic atherosclerosis. Small right pleural effusion CT THORACIC SPINE FINDINGS Alignment: 4 mm anterolisthesis of T10 on T11, new/increased from prior. Vertebrae: There are new erosive endplate changes at TW09-81without disc space collapse. Right T10-11 facet joint widening is unchanged with slightly increased erosion of the right T10 inferior facet. Interval T8-L1 posterior fusion has been performed. Pedicle screws appear well-positioned without evidence of loosening. No acute fracture is identified. Paraspinal and other soft tissues: Paravertebral edema/phlegmonous changes from T9-T12 are greater on the right than on the left and are stable to slightly increased. No organized fluid collection is identified. There is a small right pleural effusion, and there is dependent subsegmental atelectasis in both lungs. A right PICC terminates in the lower SVC. Aortic and coronary artery atherosclerosis is noted. A dorsal column stimulator enters the canal at T3-4. Disc levels: Unchanged severe disc space narrowing and degenerative endplate changes at TX9-1 Interval T10-11 posterior decompression. Limited assessment for  an epidural fluid collection due to streak artifact from posterior spinal instrumentation. No gross epidural collection above the fused segment. CT LUMBAR SPINE FINDINGS Segmentation: 5 lumbar type vertebrae. Alignment: Normal. Vertebrae: Prior L1-L3 posterior fusion, extended in the interim into the thoracic spine to the T8 level as described above. Prior interbody fusion from L1-2 to L5-S1. No evidence of screw loosening. Solid ankylosis at each level. No acute fracture. No osseous erosion to suggest active osteomyelitis. Paraspinal and other soft tissues: No significant paraspinal inflammatory changes or fluid collection to suggest infection. Mild aortoiliac atherosclerosis. Disc levels: Unchanged osseous neural foraminal stenosis at multiple levels, moderate on the left at L5-S1 and milder elsewhere as previously described. No gross spinal stenosis. IMPRESSION: 1. New endplate erosion at TY78-29with stable to slightly increased paravertebral inflammation consistent with known discitis-osteomyelitis. Limited assessment for epidural abscess. 2. Mild interval osseous erosion involving the right T10-11 facet joint which may reflect septic arthritis. 3.  No findings to suggest interval development of infection elsewhere in the cervical, thoracic, or lumbar spine. 4. Interval posterior thoracolumbar fusion as above. Electronically Signed   By: Logan Bores M.D.   On: 05/11/2018 09:18   Ct Thoracic Spine Wo Contrast  Result Date: 05/12/2018 CLINICAL DATA:  Worsening paraplegia.  History of spinal infection. FLUOROSCOPY TIME:  1 minutes and 40 seconds.  Fifteen spot films. PROCEDURE: LUMBAR PUNCTURE FOR CERVICAL LUMBAR AND THORACIC MYELOGRAM CERVICAL AND LUMBAR AND THORACIC MYELOGRAM CT CERVICAL MYELOGRAM CT LUMBAR MYELOGRAM CT THORACIC MYELOGRAM After thorough discussion of risks and benefits of the procedure including bleeding, infection, injury to nerves, blood vessels, adjacent structures as well as headache and  CSF leak, written and oral informed consent was obtained. Consent was obtained by Dr. Rolla Flatten. Patient was positioned prone on the fluoroscopy table. Local anesthesia was provided with 1% lidocaine without epinephrine after prepped and draped in the usual sterile fashion. Puncture was performed at L2-L3 using a 3 1/2 inch 20 gauge spinal needle via midline approach. Using a single pass through the dura, the needle was placed within the thecal sac, with return of clear CSF. 10 mL Isovue M-300 was injected into the thecal sac, with normal opacification of the nerve roots and cauda equina consistent with free flow within the subarachnoid space. The patient was then moved to the trendelenburg position and contrast flowed into the Thoracic and Cervical spine regions. I personally performed the lumbar puncture and administered the intrathecal contrast. I also personally supervised acquisition of the myelogram images. TECHNIQUE: Contiguous axial images were obtained through the Cervical, Thoracic, and Lumbar spine after the intrathecal infusion of infusion. Coronal and sagittal reconstructions were obtained of the axial image sets. FINDINGS: TOTAL MYELOGRAM FINDINGS: Good opacification lumbar subarachnoid space. No stenosis or blockage. Hardware intact. Patient was placed in lateral decubitus and contrast maneuvered into the thoracic region where no block was evident. Patient was further tilted down in contrast maneuvered into the cervical region where no block was present. Dorsal column stimulator leads spanned the cervicothoracic junction. Patient was escorted to CT scanning for further evaluation. CT CERVICAL MYELOGRAM FINDINGS: Alignment: Anatomic except for mild facet mediated slip C2-C3, adjacent segment. Vertebrae: No worrisome osseous lesion.  Solid arthrodesis C3-C6. Cord: Dorsal column stimulator terminating at C4. No cord compression. Paraspinal tissues: Unremarkable. Disc levels: Unchanged disc and facet  degeneration compared to last month study. Uncovertebral and facet spurring result in multilevel neural foraminal stenosis moderate to severe on the LEFT at C2-3 and moderate to severe on the RIGHT at C3-4. CT LUMBAR MYELOGRAM FINDINGS: Segmentation: Normal. Alignment:  Normal. Vertebrae: No worrisome osseous lesion.L1-2 S1 fusion appears solid. Conus medullaris: Normal in size and location. Paraspinal tissues: No evidence for hydronephrosis or paravertebral mass. Disc levels: Unchanged osseous neural foraminal stenosis at multiple levels, moderate on the LEFT at L5-S1. No significant spinal stenosis. CT THORACIC MYELOGRAM FINDINGS: Alignment: 4 mm anterolisthesis T10 on T11, new/increased from prior. Vertebrae: Erosive endplate changes at L93-79, without disc space collapse. T8-L1 fusion posteriorly. Pedicle screws in good position. Paraspinal tissues: Phlegmonous changes from T9 through T12 greater on the RIGHT. Small pleural effusion.Cord: No cord compression at any level. No significant ventral epidural process at T10-11. Cord: No cord compression, cord enlargement, or significant ventral epidural process at any level. Disc levels: Unchanged severe disc space narrowing and degenerative change at T2-T3. Interval T10-T11 posterior decompression. IMPRESSION: Total myelogram with cervical, thoracic and lumbar postmyelogram CT demonstrates no compressive lesion of the  cord at any level. Spinal stimulator device, along with multilevel multisegment fusion hardware appear appropriately placed without adverse features. There is probably some residual infection and paravertebral phlegmon at the T10-11 interspace, but there is no ventral epidural hematoma. Electronically Signed   By: Staci Righter M.D.   On: 05/12/2018 17:38   Ct Thoracic Spine W Contrast  Result Date: 05/11/2018 CLINICAL DATA:  History of discitis-osteomyelitis with epidural abscess at T10-11. Status post T10-11 decompression and debridement with  thoracolumbar fusion on 03/28/2018. Worsening lower extremity weakness. EXAM: CT CERVICAL, THORACIC, AND LUMBAR SPINE WITH CONTRAST TECHNIQUE: Multidetector CT imaging of the cervical, lumbar, and thoracic spine was performed with intravenous contrast. Multiplanar CT image reconstructions were also generated. CONTRAST:  160m ISOVUE-300 IOPAMIDOL (ISOVUE-300) INJECTION 61% COMPARISON:  Cervical spine CT 03/22/2018. Thoracic and lumbar spine CT 03/20/2018. FINDINGS: CT CERVICAL SPINE FINDINGS Alignment: Unchanged cervical spine straightening and trace anterolisthesis of C2 on C3. Skull base and vertebrae: No acute fracture. No interval osseous erosion to suggest infectious discitis-osteomyelitis. Solid C3-C6 ACDF. Soft tissues and spinal canal: Unchanged dorsal column stimulator terminating at the C4 level. No gross epidural fluid collection, however artifact from the stimulator and fusion plate and screws limits assessment. No evidence of significant paraspinal or prevertebral inflammatory changes or fluid collection. Disc levels: Unchanged disc and facet degeneration compared to last month's study. Uncovertebral and facet spurring again result in multilevel neural foraminal stenosis, moderate to severe on the left at C2-3 and on the right at C3-4. Upper chest: Partially visualized right PICC. Aortic atherosclerosis. Small right pleural effusion CT THORACIC SPINE FINDINGS Alignment: 4 mm anterolisthesis of T10 on T11, new/increased from prior. Vertebrae: There are new erosive endplate changes at [id number] disc space collapse. Right T10-11 facet joint widening is unchanged with slightly increased erosion of the right T10 inferior facet. Interval T8-L1 posterior fusion has been performed. Pedicle screws appear well-positioned without evidence of loosening. No acute fracture is identified. Paraspinal and other soft tissues: Paravertebral edema/phlegmonous changes from T9-T12 are greater on the right than on the  left and are stable to slightly increased. No organized fluid collection is identified. There is a small right pleural effusion, and there is dependent subsegmental atelectasis in both lungs. A right PICC terminates in the lower SVC. Aortic and coronary artery atherosclerosis is noted. A dorsal column stimulator enters the canal at T3-4. Disc levels: Unchanged severe disc space narrowing and degenerative endplate changes at TQ4-6 Interval T10-11 posterior decompression. Limited assessment for an epidural fluid collection due to streak artifact from posterior spinal instrumentation. No gross epidural collection above the fused segment. CT LUMBAR SPINE FINDINGS Segmentation: 5 lumbar type vertebrae. Alignment: Normal. Vertebrae: Prior L1-L3 posterior fusion, extended in the interim into the thoracic spine to the T8 level as described above. Prior interbody fusion from L1-2 to L5-S1. No evidence of screw loosening. Solid ankylosis at each level. No acute fracture. No osseous erosion to suggest active osteomyelitis. Paraspinal and other soft tissues: No significant paraspinal inflammatory changes or fluid collection to suggest infection. Mild aortoiliac atherosclerosis. Disc levels: Unchanged osseous neural foraminal stenosis at multiple levels, moderate on the left at L5-S1 and milder elsewhere as previously described. No gross spinal stenosis. IMPRESSION: 1. New endplate erosion at TN62-95with stable to slightly increased paravertebral inflammation consistent with known discitis-osteomyelitis. Limited assessment for epidural abscess. 2. Mild interval osseous erosion involving the right T10-11 facet joint which may reflect septic arthritis. 3. No findings to suggest interval development of infection elsewhere in  the cervical, thoracic, or lumbar spine. 4. Interval posterior thoracolumbar fusion as above. Electronically Signed   By: Logan Bores M.D.   On: 05/11/2018 09:18   Ct Lumbar Spine Wo Contrast  Result Date:  05/12/2018 CLINICAL DATA:  Worsening paraplegia.  History of spinal infection. FLUOROSCOPY TIME:  1 minutes and 40 seconds.  Fifteen spot films. PROCEDURE: LUMBAR PUNCTURE FOR CERVICAL LUMBAR AND THORACIC MYELOGRAM CERVICAL AND LUMBAR AND THORACIC MYELOGRAM CT CERVICAL MYELOGRAM CT LUMBAR MYELOGRAM CT THORACIC MYELOGRAM After thorough discussion of risks and benefits of the procedure including bleeding, infection, injury to nerves, blood vessels, adjacent structures as well as headache and CSF leak, written and oral informed consent was obtained. Consent was obtained by Dr. Rolla Flatten. Patient was positioned prone on the fluoroscopy table. Local anesthesia was provided with 1% lidocaine without epinephrine after prepped and draped in the usual sterile fashion. Puncture was performed at L2-L3 using a 3 1/2 inch 20 gauge spinal needle via midline approach. Using a single pass through the dura, the needle was placed within the thecal sac, with return of clear CSF. 10 mL Isovue M-300 was injected into the thecal sac, with normal opacification of the nerve roots and cauda equina consistent with free flow within the subarachnoid space. The patient was then moved to the trendelenburg position and contrast flowed into the Thoracic and Cervical spine regions. I personally performed the lumbar puncture and administered the intrathecal contrast. I also personally supervised acquisition of the myelogram images. TECHNIQUE: Contiguous axial images were obtained through the Cervical, Thoracic, and Lumbar spine after the intrathecal infusion of infusion. Coronal and sagittal reconstructions were obtained of the axial image sets. FINDINGS: TOTAL MYELOGRAM FINDINGS: Good opacification lumbar subarachnoid space. No stenosis or blockage. Hardware intact. Patient was placed in lateral decubitus and contrast maneuvered into the thoracic region where no block was evident. Patient was further tilted down in contrast maneuvered into the  cervical region where no block was present. Dorsal column stimulator leads spanned the cervicothoracic junction. Patient was escorted to CT scanning for further evaluation. CT CERVICAL MYELOGRAM FINDINGS: Alignment: Anatomic except for mild facet mediated slip C2-C3, adjacent segment. Vertebrae: No worrisome osseous lesion.  Solid arthrodesis C3-C6. Cord: Dorsal column stimulator terminating at C4. No cord compression. Paraspinal tissues: Unremarkable. Disc levels: Unchanged disc and facet degeneration compared to last month study. Uncovertebral and facet spurring result in multilevel neural foraminal stenosis moderate to severe on the LEFT at C2-3 and moderate to severe on the RIGHT at C3-4. CT LUMBAR MYELOGRAM FINDINGS: Segmentation: Normal. Alignment:  Normal. Vertebrae: No worrisome osseous lesion.L1-2 S1 fusion appears solid. Conus medullaris: Normal in size and location. Paraspinal tissues: No evidence for hydronephrosis or paravertebral mass. Disc levels: Unchanged osseous neural foraminal stenosis at multiple levels, moderate on the LEFT at L5-S1. No significant spinal stenosis. CT THORACIC MYELOGRAM FINDINGS: Alignment: 4 mm anterolisthesis T10 on T11, new/increased from prior. Vertebrae: Erosive endplate changes at [id number], without disc space collapse. T8-L1 fusion posteriorly. Pedicle screws in good position. Paraspinal tissues: Phlegmonous changes from T9 through T12 greater on the RIGHT. Small pleural effusion.Cord: No cord compression at any level. No significant ventral epidural process at T10-11. Cord: No cord compression, cord enlargement, or significant ventral epidural process at any level. Disc levels: Unchanged severe disc space narrowing and degenerative change at T2-T3. Interval T10-T11 posterior decompression. IMPRESSION: Total myelogram with cervical, thoracic and lumbar postmyelogram CT demonstrates no compressive lesion of the cord at any level. Spinal stimulator device, along with  multilevel multisegment fusion hardware appear appropriately placed without adverse features. There is probably some residual infection and paravertebral phlegmon at the T10-11 interspace, but there is no ventral epidural hematoma. Electronically Signed   By: Staci Righter M.D.   On: 05/12/2018 17:38   Ct Lumbar Spine W Contrast  Result Date: 05/11/2018 CLINICAL DATA:  History of discitis-osteomyelitis with epidural abscess at T10-11. Status post T10-11 decompression and debridement with thoracolumbar fusion on 03/28/2018. Worsening lower extremity weakness. EXAM: CT CERVICAL, THORACIC, AND LUMBAR SPINE WITH CONTRAST TECHNIQUE: Multidetector CT imaging of the cervical, lumbar, and thoracic spine was performed with intravenous contrast. Multiplanar CT image reconstructions were also generated. CONTRAST:  151m ISOVUE-300 IOPAMIDOL (ISOVUE-300) INJECTION 61% COMPARISON:  Cervical spine CT 03/22/2018. Thoracic and lumbar spine CT 03/20/2018. FINDINGS: CT CERVICAL SPINE FINDINGS Alignment: Unchanged cervical spine straightening and trace anterolisthesis of C2 on C3. Skull base and vertebrae: No acute fracture. No interval osseous erosion to suggest infectious discitis-osteomyelitis. Solid C3-C6 ACDF. Soft tissues and spinal canal: Unchanged dorsal column stimulator terminating at the C4 level. No gross epidural fluid collection, however artifact from the stimulator and fusion plate and screws limits assessment. No evidence of significant paraspinal or prevertebral inflammatory changes or fluid collection. Disc levels: Unchanged disc and facet degeneration compared to last month's study. Uncovertebral and facet spurring again result in multilevel neural foraminal stenosis, moderate to severe on the left at C2-3 and on the right at C3-4. Upper chest: Partially visualized right PICC. Aortic atherosclerosis. Small right pleural effusion CT THORACIC SPINE FINDINGS Alignment: 4 mm anterolisthesis of T10 on T11,  new/increased from prior. Vertebrae: There are new erosive endplate changes at [id number] disc space collapse. Right T10-11 facet joint widening is unchanged with slightly increased erosion of the right T10 inferior facet. Interval T8-L1 posterior fusion has been performed. Pedicle screws appear well-positioned without evidence of loosening. No acute fracture is identified. Paraspinal and other soft tissues: Paravertebral edema/phlegmonous changes from T9-T12 are greater on the right than on the left and are stable to slightly increased. No organized fluid collection is identified. There is a small right pleural effusion, and there is dependent subsegmental atelectasis in both lungs. A right PICC terminates in the lower SVC. Aortic and coronary artery atherosclerosis is noted. A dorsal column stimulator enters the canal at T3-4. Disc levels: Unchanged severe disc space narrowing and degenerative endplate changes at TE3-2 Interval T10-11 posterior decompression. Limited assessment for an epidural fluid collection due to streak artifact from posterior spinal instrumentation. No gross epidural collection above the fused segment. CT LUMBAR SPINE FINDINGS Segmentation: 5 lumbar type vertebrae. Alignment: Normal. Vertebrae: Prior L1-L3 posterior fusion, extended in the interim into the thoracic spine to the T8 level as described above. Prior interbody fusion from L1-2 to L5-S1. No evidence of screw loosening. Solid ankylosis at each level. No acute fracture. No osseous erosion to suggest active osteomyelitis. Paraspinal and other soft tissues: No significant paraspinal inflammatory changes or fluid collection to suggest infection. Mild aortoiliac atherosclerosis. Disc levels: Unchanged osseous neural foraminal stenosis at multiple levels, moderate on the left at L5-S1 and milder elsewhere as previously described. No gross spinal stenosis. IMPRESSION: 1. New endplate erosion at [id number] stable to slightly increased  paravertebral inflammation consistent with known discitis-osteomyelitis. Limited assessment for epidural abscess. 2. Mild interval osseous erosion involving the right T10-11 facet joint which may reflect septic arthritis. 3. No findings to suggest interval development of infection elsewhere in the cervical, thoracic, or lumbar spine. 4. Interval posterior  thoracolumbar fusion as above. Electronically Signed   By: Logan Bores M.D.   On: 05/11/2018 09:18   Dg Chest Portable 1 View  Result Date: 05/08/2018 CLINICAL DATA:  Verify PICC line placement. EXAM: PORTABLE CHEST 1 VIEW COMPARISON:  Apr 25, 2018 FINDINGS: A right PICC line is identified. The distal tip is difficult to visualize but appears to terminate in the central SVC. No change in the cardiomediastinal silhouette. No pneumothorax. No other acute interval changes. IMPRESSION: The right PICC line appears to be in good position. The distal tip is difficult to visualize with confidence but appears to be in the central SVC. Electronically Signed   By: Dorise Bullion III M.D   On: 05/08/2018 16:03   Dg Myelography Lumbar Inj Thoracic  Result Date: 05/12/2018 CLINICAL DATA:  Worsening paraplegia.  History of spinal infection. FLUOROSCOPY TIME:  1 minutes and 40 seconds.  Fifteen spot films. PROCEDURE: LUMBAR PUNCTURE FOR CERVICAL LUMBAR AND THORACIC MYELOGRAM CERVICAL AND LUMBAR AND THORACIC MYELOGRAM CT CERVICAL MYELOGRAM CT LUMBAR MYELOGRAM CT THORACIC MYELOGRAM After thorough discussion of risks and benefits of the procedure including bleeding, infection, injury to nerves, blood vessels, adjacent structures as well as headache and CSF leak, written and oral informed consent was obtained. Consent was obtained by Dr. Rolla Flatten. Patient was positioned prone on the fluoroscopy table. Local anesthesia was provided with 1% lidocaine without epinephrine after prepped and draped in the usual sterile fashion. Puncture was performed at L2-L3 using a 3 1/2 inch  20 gauge spinal needle via midline approach. Using a single pass through the dura, the needle was placed within the thecal sac, with return of clear CSF. 10 mL Isovue M-300 was injected into the thecal sac, with normal opacification of the nerve roots and cauda equina consistent with free flow within the subarachnoid space. The patient was then moved to the trendelenburg position and contrast flowed into the Thoracic and Cervical spine regions. I personally performed the lumbar puncture and administered the intrathecal contrast. I also personally supervised acquisition of the myelogram images. TECHNIQUE: Contiguous axial images were obtained through the Cervical, Thoracic, and Lumbar spine after the intrathecal infusion of infusion. Coronal and sagittal reconstructions were obtained of the axial image sets. FINDINGS: TOTAL MYELOGRAM FINDINGS: Good opacification lumbar subarachnoid space. No stenosis or blockage. Hardware intact. Patient was placed in lateral decubitus and contrast maneuvered into the thoracic region where no block was evident. Patient was further tilted down in contrast maneuvered into the cervical region where no block was present. Dorsal column stimulator leads spanned the cervicothoracic junction. Patient was escorted to CT scanning for further evaluation. CT CERVICAL MYELOGRAM FINDINGS: Alignment: Anatomic except for mild facet mediated slip C2-C3, adjacent segment. Vertebrae: No worrisome osseous lesion.  Solid arthrodesis C3-C6. Cord: Dorsal column stimulator terminating at C4. No cord compression. Paraspinal tissues: Unremarkable. Disc levels: Unchanged disc and facet degeneration compared to last month study. Uncovertebral and facet spurring result in multilevel neural foraminal stenosis moderate to severe on the LEFT at C2-3 and moderate to severe on the RIGHT at C3-4. CT LUMBAR MYELOGRAM FINDINGS: Segmentation: Normal. Alignment:  Normal. Vertebrae: No worrisome osseous lesion.L1-2 S1  fusion appears solid. Conus medullaris: Normal in size and location. Paraspinal tissues: No evidence for hydronephrosis or paravertebral mass. Disc levels: Unchanged osseous neural foraminal stenosis at multiple levels, moderate on the LEFT at L5-S1. No significant spinal stenosis. CT THORACIC MYELOGRAM FINDINGS: Alignment: 4 mm anterolisthesis T10 on T11, new/increased from prior. Vertebrae: Erosive endplate changes at  T10-11, without disc space collapse. T8-L1 fusion posteriorly. Pedicle screws in good position. Paraspinal tissues: Phlegmonous changes from T9 through T12 greater on the RIGHT. Small pleural effusion.Cord: No cord compression at any level. No significant ventral epidural process at T10-11. Cord: No cord compression, cord enlargement, or significant ventral epidural process at any level. Disc levels: Unchanged severe disc space narrowing and degenerative change at T2-T3. Interval T10-T11 posterior decompression. IMPRESSION: Total myelogram with cervical, thoracic and lumbar postmyelogram CT demonstrates no compressive lesion of the cord at any level. Spinal stimulator device, along with multilevel multisegment fusion hardware appear appropriately placed without adverse features. There is probably some residual infection and paravertebral phlegmon at the T10-11 interspace, but there is no ventral epidural hematoma. Electronically Signed   By: Staci Righter M.D.   On: 05/12/2018 17:38   Dg Hip Unilat With Pelvis 2-3 Views Right  Result Date: 05/08/2018 CLINICAL DATA:  Fall, right hip pain EXAM: DG HIP (WITH OR WITHOUT PELVIS) 2-3V RIGHT COMPARISON:  None. FINDINGS: No fracture or dislocation is seen. Bilateral hip joint spaces are preserved. Visualized bony pelvis appears intact. Degenerative changes at L5-S1. IMPRESSION: Negative. Electronically Signed   By: Julian Hy M.D.   On: 05/08/2018 16:53   [place] Ekg Site Rite  Result Date: 04/25/2018 If Site Rite image not attached, placement could  not be confirmed due to current cardiac rhythm.   Lab Data:  CBC: Recent Labs  Lab 05/08/18 1839 05/10/18 0933 05/11/18 0659 05/12/18 0522 05/13/18 0236 05/14/18 0408  WBC 12.0* 9.4 8.0 5.7 7.4 7.8  NEUTROABS 8.7*  --   --   --   --   --   HGB 10.0* 9.4* 9.4* 9.1* 8.9* 9.2*  HCT 34.5* 33.0* 33.0* 31.8* 31.7* 32.5*  MCV 82.5 82.5 83.5 83.9 83.4 82.1  PLT 579* 532* 579* 589* 552* 793*   Basic Metabolic Panel: Recent Labs  Lab 05/08/18 1839 05/09/18 0633 05/10/18 0933 05/11/18 0659 05/12/18 0522 05/13/18 0236  NA 134*  --  135  --  137 138  K 4.0  --  3.9  --  3.8 3.7  CL 100*  --  99*  --  102 105  CO2 26  --  29  --  27 27  GLUCOSE 116*  --  109*  --  77 126*  BUN 10  --  9  --  12 6  CREATININE 0.79  --  0.74  --  0.64 0.77  CALCIUM 8.8*  --  8.9  --  8.7* 8.7*  MG  --  1.7 1.7 2.0  --   --    GFR: Estimated Creatinine Clearance: 115.7 mL/min (by C-G formula based on SCr of 0.77 mg/dL). Liver Function Tests: Recent Labs  Lab 05/08/18 1839  AST 12*  ALT 7*  ALKPHOS 99  BILITOT 0.5  PROT 7.5  ALBUMIN 2.7*   No results for input(s): LIPASE, AMYLASE in the last 168 hours. No results for input(s): AMMONIA in the last 168 hours. Coagulation Profile: Recent Labs  Lab 05/11/18 0659 05/11/18 1953 05/12/18 0522 05/13/18 0236 05/14/18 0408  INR 2.81 1.54 1.30 1.18 1.19   Cardiac Enzymes: Recent Labs  Lab 05/11/18 0659  CKTOTAL 15*   BNP (last 3 results) No results for input(s): PROBNP in the last 8760 hours. HbA1C: No results for input(s): HGBA1C in the last 72 hours. CBG: No results for input(s): GLUCAP in the last 168 hours. Lipid Profile: No results for input(s): CHOL, HDL, LDLCALC, TRIG,  CHOLHDL, LDLDIRECT in the last 72 hours. Thyroid Function Tests: No results for input(s): TSH, T4TOTAL, FREET4, T3FREE, THYROIDAB in the last 72 hours. Anemia Panel: No results for input(s): VITAMINB12, FOLATE, FERRITIN, TIBC, IRON, RETICCTPCT in the last 72  hours. Urine analysis:    Component Value Date/Time   COLORURINE AMBER (A) 04/05/2018 1621   APPEARANCEUR CLEAR 04/05/2018 1621   LABSPEC 1.029 04/05/2018 1621   PHURINE 5.0 04/05/2018 1621   GLUCOSEU NEGATIVE 04/05/2018 1621   HGBUR NEGATIVE 04/05/2018 1621   BILIRUBINUR MODERATE (A) 04/05/2018 1621   KETONESUR NEGATIVE 04/05/2018 1621   PROTEINUR 30 (A) 04/05/2018 1621   UROBILINOGEN 0.2 03/05/2011 1909   NITRITE NEGATIVE 04/05/2018 1621   LEUKOCYTESUR NEGATIVE 04/05/2018 1621     Hannahmarie Asberry M.D. [facility] 05/14/2018, 1:32 PM  Pager: [phone] Between 7am to 7pm - call Pager - [phone]  After 7pm go to [email or web address] - password [email or web address]  Call night coverage person covering after 7pm

## 2018-05-14 NOTE — Progress Notes (Signed)
Pt has home unit and places self on/off.  RT will monitor. 

## 2018-05-15 DIAGNOSIS — G8220 Paraplegia, unspecified: Secondary | ICD-10-CM

## 2018-05-15 DIAGNOSIS — R296 Repeated falls: Secondary | ICD-10-CM

## 2018-05-15 LAB — CBC
HCT: 32.2 % — ABNORMAL LOW (ref 39.0–52.0)
Hemoglobin: 9.1 g/dL — ABNORMAL LOW (ref 13.0–17.0)
MCH: 23.2 pg — ABNORMAL LOW (ref 26.0–34.0)
MCHC: 28.3 g/dL — ABNORMAL LOW (ref 30.0–36.0)
MCV: 81.9 fL (ref 78.0–100.0)
Platelets: 578 10*3/uL — ABNORMAL HIGH (ref 150–400)
RBC: 3.93 MIL/uL — ABNORMAL LOW (ref 4.22–5.81)
RDW: 16.7 % — ABNORMAL HIGH (ref 11.5–15.5)
WBC: 7.3 10*3/uL (ref 4.0–10.5)

## 2018-05-15 LAB — HEPARIN LEVEL (UNFRACTIONATED): Heparin Unfractionated: 0.36 IU/mL (ref 0.30–0.70)

## 2018-05-15 LAB — PROTIME-INR
INR: 1.25
Prothrombin Time: 15.6 seconds — ABNORMAL HIGH (ref 11.4–15.2)

## 2018-05-15 MED ORDER — WARFARIN SODIUM 5 MG PO TABS
10.0000 mg | ORAL_TABLET | Freq: Once | ORAL | Status: AC
  Administered 2018-05-15: 10 mg via ORAL
  Filled 2018-05-15: qty 2

## 2018-05-15 NOTE — Progress Notes (Signed)
[facility]  Cody Velez is an active pt with [facility] prior to this admission at [facility] and now transfer to [facility]. [facility] is providing PT, OT and HHRN as well as home infusion pharmacy services for home IV ABX.  [facility] team will follow to support transition home when ordered/pt ready.  If patient discharges after hours, please call [phone].   [name] 05/15/2018, 7:26 AM

## 2018-05-15 NOTE — Consult Note (Signed)
Physical Medicine and Rehabilitation Consult Reason for Consult: Decreased functional mobility Referring Physician: [facility]   HPI: Cody Velez is a 72 y.o. right-handed male with history of hypertension, memory deficits maintained on Aricept and Namenda, prostate cancer, PTSD, chronic back pain with multiple lumbar surgeries as well as spinal cord stimulator placement January 2019.  Per chart review patient currently living with girlfriend.  One level home with ramped entrance.  He did have a rolling walker but preferred to use his wheelchair.  Patient well-known to rehab services for admission 03/30/2018 until 05/06/2018 for T10-T11 osteomyelitis with epidural abscess with subsequent myelopathy status post decompression and fusion discharged on Ancef 2 g every 8 hours as well as rifampin which was to be completed 05/22/2018.  Hospital course was complicated by left soleal right peroneal deep vein thrombosis placed on Coumadin therapy.  At time of discharge patient was ambulating 12 feet with assistance for balance TLSO brace modified independence to propel his wheelchair.  Presented 05/08/2018 with reported numerous falls to [facility].  CT of lumbar thoracic and cervical spine showed new endplate erosion at [id number] with stable to slightly increased paravertebral inflammation consistent with known discitis-osteomyelitis.  No findings to suggest interval development of infection.  He was discharged to [facility] for further evaluation.  Myelogram completed showing no compressive lesion of the cord at any level.  No ventral epidural hematoma.  Patient remains on Ancef as well as rifampin as prior to admission.  Coumadin for DVT ongoing as initially reversed for myelogram.  Physical and occupational therapy evaluations completed with recommendations of physical medicine rehab consult.  Discussed with patient that there were no new spinal cord compressive lesions identified with further  work-up. Review of Systems  Constitutional: Negative for chills and fever.  HENT: Negative for hearing loss.   Eyes: Negative for blurred vision and double vision.  Respiratory: Negative for cough and shortness of breath.   Cardiovascular: Negative for chest pain and leg swelling.  Gastrointestinal: Positive for constipation. Negative for nausea and vomiting.       GERD  Musculoskeletal: Positive for falls and myalgias.  Skin: Negative for rash.  Neurological: Positive for sensory change, focal weakness and headaches.  Psychiatric/Behavioral: Positive for depression and memory loss.       PTSD  All other systems reviewed and are negative.  Past Medical History:  Diagnosis Date  . Allergic rhinitis   . Arthritis   . Asthma    as a child  . B12 deficiency   . Cancer [facility])    prostate  . Cervicogenic headache 01/28/2016  . Chronic back pain   . Coronary atherosclerosis of native coronary artery    Mild nonobstructive CAD at catheterization January 2015  . Depression   . Essential hypertension, benign   . Falls   . GERD (gastroesophageal reflux disease)   . History of cerebrovascular disease 07/23/2015  . History of pneumonia 02/2011  . Hyperlipidemia   . Kidney stone   . Memory difficulty 07/23/2015  . OSA (obstructive sleep apnea)    CPAP - Dr. [name]  . Prostate cancer ([place])   . PTSD (post-traumatic stress disorder)    [place]  . Rectal bleeding    Past Surgical History:  Procedure Laterality Date  . APPLICATION OF ROBOTIC ASSISTANCE FOR SPINAL PROCEDURE N/A 03/28/2018   Procedure: APPLICATION OF ROBOTIC ASSISTANCE FOR SPINAL PROCEDURE;  Surgeon: Kristeen Miss, MD;  Location: [place];  Service: Neurosurgery;  Laterality:  N/A;  . BACK SURGERY  02/14/12   lumbar OR #7; "today redid L1L2; replaced screws; added bone from hip"  . BILATERAL KNEE ARTHROSCOPY    . COLONOSCOPY  10/15/2008   Dr. [name]: tubular adenoma   . COLONOSCOPY  12/17/2003   SEG:BTDVVO rectal and colon  .  COLONOSCOPY N/A 09/05/2014   Procedure: COLONOSCOPY;  Surgeon: Daneil Dolin, MD;  [facility];  Service: Endoscopy;  Laterality: N/A;  7:30-rescheduled 9/17 to [place] notified pt  . CYSTOSCOPY WITH STENT PLACEMENT Right 01/27/2016   Procedure: CYSTOSCOPY WITH STENT PLACEMENT;  Surgeon: [name], MD;  Location: [facility];  Service: Urology;  Laterality: Right;  . CYSTOSCOPY/RETROGRADE/URETEROSCOPY/STONE EXTRACTION WITH BASKET Right 01/27/2016   Procedure: CYSTOSCOPY, RIGHT RETROGRADE, RIGHT URETEROSCOPY, STONE EXTRACTION ;  Surgeon: [name], MD;  Location: [facility];  Service: Urology;  Laterality: Right;  . ESOPHAGOGASTRODUODENOSCOPY  10/15/2008     Dr Rourk:Schatzki's ring status post dilation and disruption via 32 F Maloney dilator/ otherwise unremarkable esophagus, small hiatal hernia, multiple fundal gland polyps not manipulated, gastritis, negative H.pylori  . ESOPHAGOGASTRODUODENOSCOPY  06/21/02   HYW:VPXTG sliding hiatal hernia with mild changes of reflux esophagitis limited to gastroesophageal junction.  Noncritical ring at distal esophagus, 3 cm proximal to gastroesophageal junction/Antral gastritis  . [place]   "broke face playing softball"  . FRACTURE SURGERY     "left wrist; broke it; took spur off"  . HOLMIUM LASER APPLICATION Right 05/22/6947   Procedure: HOLMIUM LASER APPLICATION;  Surgeon: [name], MD;  Location: [facility];  Service: Urology;  Laterality: Right;  . LEFT HEART CATHETERIZATION WITH CORONARY ANGIOGRAM N/A 12/27/2013   Procedure: LEFT HEART CATHETERIZATION WITH CORONARY ANGIOGRAM;  Surgeon: [name], MD;  Location: [facility] CATH LAB;  Service: Cardiovascular;  Laterality: N/A;  . neck epidural    . POSTERIOR LUMBAR FUSION 4 LEVEL N/A 03/28/2018   Procedure: Thoracic eight -Lumbar two- FIXATION WITH SCREW PLACEMENT, DECOMPRESSION Thoracic ten-Thoracic eleven  FOR OSTEOMYELITIS;  Surgeon: Kristeen Miss, MD;   Location: [place];  Service: Neurosurgery;  Laterality: N/A;  . SHOULDER SURGERY Bilateral   . TEE WITHOUT CARDIOVERSION N/A 03/21/2018   Procedure: TRANSESOPHAGEAL ECHOCARDIOGRAM (TEE) WITH PROPOFOL;  Surgeon: [name], MD;  [facility];  Service: Cardiovascular;  Laterality: N/A;   Family History  Problem Relation Age of Onset  . Emphysema Father   . Heart failure Father   . Lung cancer Father   . CAD Father   . Colon cancer Mother   . Stroke Mother   . Breast cancer Mother   . Stroke Sister   . Heart attack Brother   . Dementia Paternal Uncle   . Emphysema Maternal Grandmother   . Stroke Maternal Grandmother   . Asthma Other        grandson  . Heart disease Paternal Grandfather   . Anesthesia problems Neg Hx   . Hypotension Neg Hx   . Malignant hyperthermia Neg Hx   . Pseudochol deficiency Neg Hx    Social History:  reports that he quit smoking about 59 years ago. His smoking use included cigarettes. He has quit using smokeless tobacco. His smokeless tobacco use included chew. He reports that he drinks alcohol. He reports that he does not use drugs. Allergies: No Known Allergies Medications Prior to Admission  Medication Sig Dispense Refill  . amLODipine (NORVASC) 10 MG tablet Take 1 tablet (10 mg total) by mouth daily.  30 tablet 0  . aspirin 81 MG tablet Take 81 mg by mouth daily.    . Baclofen 5 MG TABS Take 5 mg by mouth 3 (three) times daily as needed for muscle spasms. 30 tablet 0  . ceFAZolin (ANCEF) IVPB Inject 2 g into the vein every 8 (eight) hours for 18 days. Indication:  MSSA Bacteremia and extensive epidural abscess discitis of thoracic spine Last Day of Therapy: 05/22/2018 Labs - Once weekly:  CBC/D and BMP, Labs - Every other week:  ESR and CRP 53 Units 0  . cycloSPORINE (RESTASIS) 0.05 % ophthalmic emulsion 1 DROP INTO BOTH EYES TWICE A DAY FOR DRY EYES    . docusate sodium (COLACE) 100 MG capsule Take 200 mg by mouth 2 (two) times daily.      Marland Kitchen donepezil (ARICEPT) 10 MG tablet Take 1 tablet (10 mg total) by mouth at bedtime. 90 tablet 3  . finasteride (PROSCAR) 5 MG tablet Take 1 tablet (5 mg total) by mouth daily. Reported on 05/04/2016 30 tablet 0  . fluticasone (FLONASE) 50 MCG/ACT nasal spray Place 1 spray into both nostrils daily as needed for allergies.     Marland Kitchen gabapentin (NEURONTIN) 300 MG capsule Take 2 capsules (600 mg total) by mouth 3 (three) times daily. 90 capsule 0  . latanoprost (XALATAN) 0.005 % ophthalmic solution Place 1 drop into both eyes at bedtime.     . Melatonin 3 MG TABS Take 0.5 tablets (1.5 mg total) by mouth at bedtime. 30 tablet 0  . memantine (NAMENDA) 10 MG tablet Take 1 tablet (10 mg total) by mouth 2 (two) times daily. 180 tablet 3  . methocarbamol (ROBAXIN) 500 MG tablet Take 1 tablet (500 mg total) by mouth 4 (four) times daily. 180 tablet 0  . omeprazole (PRILOSEC) 20 MG capsule Take 2 capsules (40 mg total) by mouth daily. 60 capsule 0  . oxyCODONE 10 MG TABS Take 1 tablet (10 mg total) by mouth every 4 (four) hours as needed for moderate pain or severe pain. 30 tablet 0  . pravastatin (PRAVACHOL) 10 MG tablet Take 1 tablet (10 mg total) by mouth every evening. 30 tablet 0  . rifampin (RIFADIN) 300 MG capsule Continue through 05/22/2018 and stop (Patient taking differently: Take 300 mg by mouth every 12 (twelve) hours. Continue through 05/22/2018 and stop) 60 capsule 0  . traZODone (DESYREL) 100 MG tablet Take 1 tablet (100 mg total) by mouth at bedtime. 30 tablet 0  . venlafaxine XR (EFFEXOR-XR) 150 MG 24 hr capsule Take 1 capsule (150 mg total) by mouth 2 (two) times daily. 60 capsule 0  . vitamin B-12 (CYANOCOBALAMIN) 1000 MCG tablet Take 1 tablet (1,000 mcg total) by mouth daily. 30 tablet 0  . warfarin (COUMADIN) 5 MG tablet Take 1.5 tablets (7.17m) on Sunday, Tuesday, Wednesday, Thursday, and Saturday.  Take 2 tablets (150m on Monday and Friday. 60 tablet 1  . lidocaine (LIDODERM) 5 % Place 1 patch  onto the skin daily. Remove & Discard patch within 12 hours or as directed by MD (Patient not taking: Reported on 05/08/2018) 30 patch 0  . linaclotide (LINZESS) 290 MCG CAPS capsule Take 1 capsule (290 mcg total) by mouth daily before breakfast. 30 capsule 0  . lisinopril (PRINIVIL,ZESTRIL) 5 MG tablet Take 1 tablet (5 mg total) by mouth daily. 30 tablet 0  . potassium chloride SA (K-DUR,KLOR-CON) 20 MEQ tablet Take 1 tablet (20 mEq total) by mouth daily. 30 tablet 0  Home: Home Living Family/patient expects to be discharged to:: Inpatient rehab Living Arrangements: Spouse/significant other(Lives with Girfriend) Available Help at Discharge: Friend(s), Available 24 hours/day(girlfriend) Type of Home: House Home Access: Ramped entrance Home Layout: Able to live on main level with bedroom/bathroom, Multi-level Alternate Level Stairs-Number of Steps: 14 Alternate Level Stairs-Rails: Right Bathroom Shower/Tub: Chiropodist: Standard Bathroom Accessibility: Yes Home Equipment: Environmental consultant - 2 wheels, Shower seat, Bedside commode, Cane - single point, Wheelchair - manual, Tub bench Additional Comments: drop arm BSC, 30" slide board, wheelchair is a rental until he receives his.  Lives With: Significant other  Functional History: Prior Function Level of Independence: Needs assistance Gait / Transfers Assistance Needed: limited to a few steps using RW with assistance since back surgery ADL's / Homemaking Assistance Needed: assisted by family Functional Status:  Mobility: Bed Mobility Overal bed mobility: Needs Assistance Bed Mobility: Rolling, Sidelying to Sit Rolling: Mod assist Sidelying to sit: Min assist Supine to sit: Min guard Sit to supine: Mod assist General bed mobility comments: Assist to roll with cues for log roll technique. She was able to power up from [facility] with min assist.  Transfers Overall transfer level: Needs assistance Equipment used: Sliding  board Transfer via Lift Equipment: Stedy Transfers: Sit to/from Stand Sit to Stand: Mod assist, +2 safety/equipment  Lateral/Scoot Transfers: Min assist General transfer comment: Mod assist from OT to power up to standing position within [place] frame. Pt's girlfriend assisting with placement of flaps behind hips and steadying w/c during transfer.       ADL: ADL Overall ADL's : Needs assistance/impaired Eating/Feeding: Set up, Sitting Grooming: Supervision/safety, Sitting, Wash/dry face Grooming Details (indicate cue type and reason): able to shave at sink at wheelchair level with set-up Upper Body Bathing: Minimal assistance, Sitting Lower Body Bathing: Moderate assistance, Sitting/lateral leans Upper Body Dressing : Maximal assistance, Sitting Upper Body Dressing Details (indicate cue type and reason): including brace Lower Body Dressing: Sitting/lateral leans, Maximal assistance Toilet Transfer: Moderate assistance, +2 for safety/equipment(with stedy) Toilet Transfer Details (indicate cue type and reason): Able to pull self up to standing with Stedy frame and sustain standing for long enough to move flaps below hips to sit on Stedy.  Toileting- Clothing Manipulation and Hygiene: Total assistance, Sit to/from stand, +2 for safety/equipment General ADL Comments: Pt able to complete dressing tasks at EOB, utilize Stedy with mod assist +2 for safety to transfer to wheelchair in preparation for grooming tasks at sink at wheelchair level. Girlfriend present and engaged in session.   Cognition: Cognition Overall Cognitive Status: Within Functional Limits for tasks assessed Orientation Level: Oriented X4 Cognition Arousal/Alertness: Awake/alert Behavior During Therapy: WFL for tasks assessed/performed Overall Cognitive Status: Within Functional Limits for tasks assessed  Blood pressure 123/74, pulse 69, temperature 97.6 F (36.4 C), temperature source Oral, resp. rate 20, height '6\' 3"'   (1.905 m), weight 125.6 kg (276 lb 14.4 oz), SpO2 97 %. Physical Exam  Nursing note and vitals reviewed. Constitutional: He is oriented to person, place, and time. He appears well-developed and well-nourished.  72 year old right-handed male  HENT:  Head: Normocephalic.  Eyes: EOM are normal.  Neck: Normal range of motion. Neck supple. No thyromegaly present.  Cardiovascular: Normal rate and regular rhythm.  Respiratory: Effort normal and breath sounds normal. No respiratory distress.  GI: Soft. Bowel sounds are normal. He exhibits no distension.  Neurological: He is alert and oriented to person, place, and time. He exhibits abnormal muscle tone. Gait abnormal.  Patient with 5/5  strength bilateral deltoid, bicep, tricep, grip 3- at the hip flexors knee extensors and ankle dorsiflexors. Sensation intact to light touch and proprioception in the lower limbs Patient notes altered sensation below T12 dermatomal area however he is able to localize light touch Increased tone in knee and ankle extensors  Skin: Skin is warm and dry.    Results for orders placed or performed during the hospital encounter of 05/08/18 (from the past 24 hour(s))  Protime-INR     Status: Abnormal   Collection Time: 05/15/18  4:11 AM  Result Value Ref Range   Prothrombin Time 15.6 (H) 11.4 - 15.2 seconds   INR 1.25   CBC     Status: Abnormal   Collection Time: 05/15/18  4:11 AM  Result Value Ref Range   WBC 7.3 4.0 - 10.5 K/uL   RBC 3.93 (L) 4.22 - 5.81 MIL/uL   Hemoglobin 9.1 (L) 13.0 - 17.0 g/dL   HCT 32.2 (L) 39.0 - 52.0 %   MCV 81.9 78.0 - 100.0 fL   MCH 23.2 (L) 26.0 - 34.0 pg   MCHC 28.3 (L) 30.0 - 36.0 g/dL   RDW 16.7 (H) 11.5 - 15.5 %   Platelets 578 (H) 150 - 400 K/uL  Heparin level (unfractionated)     Status: None   Collection Time: 05/15/18  4:11 AM  Result Value Ref Range   Heparin Unfractionated 0.36 0.30 - 0.70 IU/mL   No results found.   Assessment/Plan: Diagnosis: Subacute paraparesis  secondary to thoracic myelopathy.   1. Does the need for close, 24 hr/day medical supervision in concert with the patient's rehab needs make it unreasonable for this patient to be served in a less intensive setting? No 2. Co-Morbidities requiring supervision/potential complications: Hypertension 3. Due to bladder management, bowel management, safety and skin/wound care, does the patient require 24 hr/day rehab nursing? Potentially 4. Does the patient require coordinated care of a physician, rehab nurse, PT, OT to address physical and functional deficits in the context of the above medical diagnosis(es)? No Addressing deficits in the following areas: balance, endurance, locomotion, strength, transferring, bathing, dressing, grooming and toileting 5. Can the patient actively participate in an intensive therapy program of at least 3 hrs of therapy per day at least 5 days per week? Potentially 6. The potential for patient to make measurable gains while on inpatient rehab is Limited 7. Anticipated functional outcomes upon discharge from inpatient rehab are n/a  with PT, n/a with OT, n/a with SLP. 8. Estimated rehab length of stay to reach the above functional goals is: Not applicable 9. Anticipated D/C setting: Other 10. Anticipated post D/C treatments: N/A 11. Overall Rehab/Functional Prognosis: fair  RECOMMENDATIONS: This patient's condition is appropriate for continued rehabilitative care in the following setting: SNF Patient has agreed to participate in recommended program. Patient would prefer to come to CIR Note that insurance prior authorization may be required for reimbursement for recommended care.  Comment: Patient has completed a 5-week CIR stay and was discharged home, no new deficits or new medical diagnosis.  Discussed with patient that a lower intensity longer duration program would be best at this point.  "I have personally performed a face to face diagnostic evaluation of this  patient.  Additionally, I have reviewed and concur with the physician assistant's documentation above."  Charlett Blake M.D. [facility] FAAPM&R (Sports Med, Neuromuscular Med) Diplomate Am Board of Electrodiagnostic Med   Elizabeth Sauer 05/15/2018

## 2018-05-15 NOTE — Progress Notes (Signed)
[facility]                                                                              Patient Demographics  Cody Velez, is a 72 y.o. male, DOB - November 15, 1946, DHR:[id number]  Admit date - 05/08/2018   Admitting Physician Phillips Grout, MD  Outpatient Primary MD for the patient is Asencion Noble, MD  Outpatient specialists:   LOS - 4  days   Medical records reviewed and are as summarized below:    Chief Complaint  Patient presents with  . Fall       Brief summary   Patient is a 72 year old male with hypertension, hyperlipidemia, morbid obesity, cad, chronic back pain status post spinal stimulator(C-spine) placed 1/30/19presented withincreasing mechanical falls at home. Patient was recently discharged from [facility] a stay from 03/30/2018 through 05/06/2018.  The patient was initially admitted in early April 2019, found to have MSSA bacteremia. Extensive work-up at that time revealed epidural abscess and T10-11 osteomyelitis and discitis, underwent laminectomy, decompression of epidural abscess, and methacrylate screw augmentation on 03/28/2018 performed by Dr. Ellene Route.The patient was followed by infectious disease. They recommended cefazolin and rifampin for 8 weeks to be completed on 05/22/2018. He was subsequently discharged to inpatient rehabilitation on 03/30/2018.  Patient was making significant progress at that time. However since discharge home, the patient has noted increasing lower extremity weakness and increasing falling.  Patient was transferred to [facility] for myelogram and neurosurgery evaluation.     Assessment & Plan   Bilateral lower extremity weakness, falls -Feeling somewhat better today, recently finished rehab, interested in CIR again  -CT cervical, thoracic, lumbar spine 5/22 showed new endplate erosion I68-03 with a increased paravertebral inflammation, concern for septic arthritis -Myelogram completed on 5/24, reviewed by  Dr. Ellene Route per neurosurgery, patient does not have evidence of canal compromise.,  Spinal cord compression epidural collections or neuro compromise from an anatomic basis. - Recommended neurology consult, seen by Dr. Orlena Sheldon, recommended PT OT evaluation for rehab -PT recommended inpatient rehab, evaluated by CIR, awaiting bed  Epidural abscess/vertebral osteomyelitis/discitis -Blood cultures had grown MSSA previously,  - continue IV cefazolin, rifampin, tentative stop date on 05/22/2018 -Status post I&D and laminectomy on 4/9  Acute DVT right peroneal vein, left soleal vein -Continue IV heparin bridge with warfarin per pharmacy -INR 1.25  Essential hypertension Stable, continue amlodipine, lisinopril  Chronic pain syndrome -Currently stable, continue gabapentin   Depression Continue home dose of venlafaxine  Hyperlipidemia Continue statin  Coronary artery disease Currently no symptoms, no chest pain or shortness of breath  Cognitive impairment, possible underlying dementia Continue Aricept, Namenda  Code Status: Full code DVT Prophylaxis: Heparin plus Coumadin Family Communication: Discussed in detail with the patient, all imaging results, lab results explained to the patient.     Disposition Plan: Awaiting CIR bed.  Patient prefers CIR rather than skilled nursing facility  Time Spent in minutes 15 minutes   Procedures:  Myelogram CT spine  Consultants:   Neurology Neurosurgery  Antimicrobials:      Medications  Scheduled Meds: . amLODipine  10 mg Oral Daily  . aspirin  81  mg Oral Daily  . docusate sodium  200 mg Oral BID  . donepezil  10 mg Oral QHS  . finasteride  5 mg Oral Daily  . gabapentin  400 mg Oral TID  . latanoprost  1 drop Both Eyes QHS  . lidocaine (PF)  5 mL Other Once  . linaclotide  290 mcg Oral QAC breakfast  . lisinopril  5 mg Oral Daily  . Melatonin  1.5 mg Oral QHS  . memantine  10 mg Oral BID  . methocarbamol  500 mg Oral QID    . pantoprazole  40 mg Oral Daily  . polyethylene glycol  17 g Oral Daily  . potassium chloride SA  20 mEq Oral Daily  . pravastatin  10 mg Oral QPM  . rifampin  300 mg Oral Q12H  . sodium chloride flush  3 mL Intravenous Q12H  . traZODone  100 mg Oral QHS  . venlafaxine XR  150 mg Oral BID  . vitamin B-12  1,000 mcg Oral Daily  . warfarin  10 mg Oral ONCE-1800  . Warfarin - Pharmacist Dosing Inpatient   Does not apply q1800   Continuous Infusions: . sodium chloride 250 mL (05/13/18 0556)  .  ceFAZolin (ANCEF) IV Stopped (05/15/18 0211)  . heparin 2,400 Units/hr (05/15/18 0951)   PRN Meds:.sodium chloride, fluticasone, oxyCODONE, sodium chloride flush   Antibiotics   Anti-infectives (From admission, onward)   Start     Dose/Rate Route Frequency Ordered Stop   05/09/18 0100  ceFAZolin (ANCEF) IVPB 2g/100 mL premix     2 g 200 mL/hr over 30 Minutes Intravenous Every 8 hours 05/08/18 2213 05/22/18 1659   05/08/18 2200  rifampin (RIFADIN) capsule 300 mg    Note to Pharmacy:  Continue through 05/22/2018 and stop     300 mg Oral Every 12 hours 05/08/18 2032 05/22/18 2159   05/08/18 2200  ceFAZolin (ANCEF) IVPB  Status:  Discontinued    Note to Pharmacy:  Indication:  MSSA Bacteremia and extensive epidural abscess discitis of thoracic spine Last Day of Therapy: 05/22/2018 Labs - Once weekly:  CBC/D and BMP, Labs - Every other week:  ESR and CRP     2 g Intravenous Every 8 hours 05/08/18 2032 05/08/18 2211   05/08/18 1530  ceFAZolin (ANCEF) 3 g in dextrose 5 % 50 mL IVPB     3 g 100 mL/hr over 30 Minutes Intravenous  Once 05/08/18 1521 05/08/18 1726        Subjective:   Cody Velez was seen and examined today.  No complaints, feeling better.   Patient denies dizziness, chest pain, shortness of breath, abdominal pain, N/V/D/C. No acute events overnight.    Objective:   Vitals:   05/14/18 2328 05/15/18 0333 05/15/18 0500 05/15/18 0830  BP: 116/69 123/74  (!) 144/83  Pulse:  72 69  79  Resp: '20 20  17  ' Temp: 98.2 F (36.8 C) 97.6 F (36.4 C)  98.2 F (36.8 C)  TempSrc: Oral Oral  Oral  SpO2: 96% 97%  98%  Weight:   125.6 kg (276 lb 14.4 oz)   Height:        Intake/Output Summary (Last 24 hours) at 05/15/2018 1157 Last data filed at 05/15/2018 0515 Gross per 24 hour  Intake 1490.4 ml  Output 1325 ml  Net 165.4 ml     Wt Readings from Last 3 Encounters:  05/15/18 125.6 kg (276 lb 14.4 oz)  05/06/18 125 kg (275 lb 9.2  oz)  03/27/18 130.4 kg (287 lb 7.7 oz)     Exam    General: Alert and oriented x 3, NAD  Eyes:   HEENT:  Atraumatic, normocephalic  Cardiovascular: S1 S2 auscultated,RRR. No pedal edema b/l  Respiratory: CTAB  Gastrointestinal: Soft, NT, ND, + bowel sounds  Ext: no pedal edema bilaterally  Neuro: no new deficits  Musculoskeletal: No digital cyanosis, clubbing  Skin: No rashes  Psych: Normal affect and demeanor, alert and oriented x3    Data Reviewed:  I have personally reviewed following labs and imaging studies  Micro Results No results found for this or any previous visit (from the past 240 hour(s)).  Radiology Reports Dg Chest 1 View  Result Date: 04/25/2018 CLINICAL DATA:  Possible malfunction of the right-sided PICC line. EXAM: CHEST  1 VIEW COMPARISON:  Chest x-ray dated March 17, 2018 FINDINGS: The right-sided PICC line tip terminates at the junction of the right and left brachiocephalic veins. The lungs are well-expanded. The interstitial markings are increased. There is no alveolar infiltrate or significant pleural effusion. The heart is normal in size. The pulmonary vascularity is not engorged. There is calcification in the wall of the thoracic aorta. The patient has undergone previous mid and distal thoracic spinal fusion. There are nerve stimulator electrodes present which terminate in the lower cervical region. IMPRESSION: COPD. Mild interstitial prominence may reflect pulmonary fibrosis or low-grade  interstitial edema. Thoracic aortic atherosclerosis. Electronically Signed   By: David  Martinique M.D.   On: 04/25/2018 13:52   Dg Lumbar Spine 2-3 Views  Result Date: 05/08/2018 CLINICAL DATA:  Low back pain after a fall. EXAM: LUMBAR SPINE - 2-3 VIEW COMPARISON:  Spot fluoro films from 03/28/2018. FINDINGS: Patient is status post extensive thoracolumbar fusion posteriorly with incomplete visualization of the thoracolumbar fusion hardware. Image portions of the hardware show no complicating features. Interbody cages are seen at L4-5 and L5-S1. Bones are diffusely demineralized. Although no definite compression deformity is evident, lateral film is limited in utility given the demineralization. IMPRESSION: No evidence for an acute fracture on this limited two-view study. Electronically Signed   By: Misty Stanley M.D.   On: 05/08/2018 16:58   Dg Knee 1-2 Views Left  Result Date: 05/08/2018 CLINICAL DATA:  [place] Saturday landing on bilateral knees. EXAM: RIGHT KNEE - 1-2 VIEW; LEFT KNEE - 1-2 VIEW COMPARISON:  MRI of the LEFT knee November 14, 2009 FINDINGS: LEFT: No acute fracture deformity or dislocation. Moderate to severe tricompartmental joint space narrowing with periarticular sclerosis and marginal spurring. Osteopenia without destructive bony lesions. Small suprapatellar joint effusion. Mild vascular calcifications. RIGHT: No acute fracture deformity dislocation. Moderate tricompartmental joint space narrowing with periarticular sclerosis and marginal spurring. No destructive bony lesions. Osteopenia. Soft tissue planes are nonsuspicious. IMPRESSION: 1. No acute fracture deformity dislocation. Small LEFT suprapatellar joint effusion. 2. Moderate RIGHT and moderate to severe LEFT tricompartmental osteoarthrosis. Electronically Signed   By: Elon Alas M.D.   On: 05/08/2018 16:55   Dg Knee 1-2 Views Right  Result Date: 05/08/2018 CLINICAL DATA:  [place] landing on bilateral knees. EXAM: RIGHT  KNEE - 1-2 VIEW; LEFT KNEE - 1-2 VIEW COMPARISON:  MRI of the LEFT knee November 14, 2009 FINDINGS: LEFT: No acute fracture deformity or dislocation. Moderate to severe tricompartmental joint space narrowing with periarticular sclerosis and marginal spurring. Osteopenia without destructive bony lesions. Small suprapatellar joint effusion. Mild vascular calcifications. RIGHT: No acute fracture deformity dislocation. Moderate tricompartmental joint space narrowing with periarticular sclerosis  and marginal spurring. No destructive bony lesions. Osteopenia. Soft tissue planes are nonsuspicious. IMPRESSION: 1. No acute fracture deformity dislocation. Small LEFT suprapatellar joint effusion. 2. Moderate RIGHT and moderate to severe LEFT tricompartmental osteoarthrosis. Electronically Signed   By: Elon Alas M.D.   On: 05/08/2018 16:55   Ct Cervical Spine Wo Contrast  Result Date: 05/12/2018 CLINICAL DATA:  Worsening paraplegia.  History of spinal infection. FLUOROSCOPY TIME:  1 minutes and 40 seconds.  Fifteen spot films. PROCEDURE: LUMBAR PUNCTURE FOR CERVICAL LUMBAR AND THORACIC MYELOGRAM CERVICAL AND LUMBAR AND THORACIC MYELOGRAM CT CERVICAL MYELOGRAM CT LUMBAR MYELOGRAM CT THORACIC MYELOGRAM After thorough discussion of risks and benefits of the procedure including bleeding, infection, injury to nerves, blood vessels, adjacent structures as well as headache and CSF leak, written and oral informed consent was obtained. Consent was obtained by Dr. Rolla Flatten. Patient was positioned prone on the fluoroscopy table. Local anesthesia was provided with 1% lidocaine without epinephrine after prepped and draped in the usual sterile fashion. Puncture was performed at L2-L3 using a 3 1/2 inch 20 gauge spinal needle via midline approach. Using a single pass through the dura, the needle was placed within the thecal sac, with return of clear CSF. 10 mL Isovue M-300 was injected into the thecal sac, with normal  opacification of the nerve roots and cauda equina consistent with free flow within the subarachnoid space. The patient was then moved to the trendelenburg position and contrast flowed into the Thoracic and Cervical spine regions. I personally performed the lumbar puncture and administered the intrathecal contrast. I also personally supervised acquisition of the myelogram images. TECHNIQUE: Contiguous axial images were obtained through the Cervical, Thoracic, and Lumbar spine after the intrathecal infusion of infusion. Coronal and sagittal reconstructions were obtained of the axial image sets. FINDINGS: TOTAL MYELOGRAM FINDINGS: Good opacification lumbar subarachnoid space. No stenosis or blockage. Hardware intact. Patient was placed in lateral decubitus and contrast maneuvered into the thoracic region where no block was evident. Patient was further tilted down in contrast maneuvered into the cervical region where no block was present. Dorsal column stimulator leads spanned the cervicothoracic junction. Patient was escorted to CT scanning for further evaluation. CT CERVICAL MYELOGRAM FINDINGS: Alignment: Anatomic except for mild facet mediated slip C2-C3, adjacent segment. Vertebrae: No worrisome osseous lesion.  Solid arthrodesis C3-C6. Cord: Dorsal column stimulator terminating at C4. No cord compression. Paraspinal tissues: Unremarkable. Disc levels: Unchanged disc and facet degeneration compared to last month study. Uncovertebral and facet spurring result in multilevel neural foraminal stenosis moderate to severe on the LEFT at C2-3 and moderate to severe on the RIGHT at C3-4. CT LUMBAR MYELOGRAM FINDINGS: Segmentation: Normal. Alignment:  Normal. Vertebrae: No worrisome osseous lesion.L1-2 S1 fusion appears solid. Conus medullaris: Normal in size and location. Paraspinal tissues: No evidence for hydronephrosis or paravertebral mass. Disc levels: Unchanged osseous neural foraminal stenosis at multiple levels,  moderate on the LEFT at L5-S1. No significant spinal stenosis. CT THORACIC MYELOGRAM FINDINGS: Alignment: 4 mm anterolisthesis T10 on T11, new/increased from prior. Vertebrae: Erosive endplate changes at [id number], without disc space collapse. T8-L1 fusion posteriorly. Pedicle screws in good position. Paraspinal tissues: Phlegmonous changes from T9 through T12 greater on the RIGHT. Small pleural effusion.Cord: No cord compression at any level. No significant ventral epidural process at T10-11. Cord: No cord compression, cord enlargement, or significant ventral epidural process at any level. Disc levels: Unchanged severe disc space narrowing and degenerative change at T2-T3. Interval T10-T11 posterior decompression. IMPRESSION: Total myelogram with  cervical, thoracic and lumbar postmyelogram CT demonstrates no compressive lesion of the cord at any level. Spinal stimulator device, along with multilevel multisegment fusion hardware appear appropriately placed without adverse features. There is probably some residual infection and paravertebral phlegmon at the T10-11 interspace, but there is no ventral epidural hematoma. Electronically Signed   By: Staci Righter M.D.   On: 05/12/2018 17:38   Ct Cervical Spine W Contrast  Result Date: 05/11/2018 CLINICAL DATA:  History of discitis-osteomyelitis with epidural abscess at T10-11. Status post T10-11 decompression and debridement with thoracolumbar fusion on 03/28/2018. Worsening lower extremity weakness. EXAM: CT CERVICAL, THORACIC, AND LUMBAR SPINE WITH CONTRAST TECHNIQUE: Multidetector CT imaging of the cervical, lumbar, and thoracic spine was performed with intravenous contrast. Multiplanar CT image reconstructions were also generated. CONTRAST:  149m ISOVUE-300 IOPAMIDOL (ISOVUE-300) INJECTION 61% COMPARISON:  Cervical spine CT 03/22/2018. Thoracic and lumbar spine CT 03/20/2018. FINDINGS: CT CERVICAL SPINE FINDINGS Alignment: Unchanged cervical spine straightening and  trace anterolisthesis of C2 on C3. Skull base and vertebrae: No acute fracture. No interval osseous erosion to suggest infectious discitis-osteomyelitis. Solid C3-C6 ACDF. Soft tissues and spinal canal: Unchanged dorsal column stimulator terminating at the C4 level. No gross epidural fluid collection, however artifact from the stimulator and fusion plate and screws limits assessment. No evidence of significant paraspinal or prevertebral inflammatory changes or fluid collection. Disc levels: Unchanged disc and facet degeneration compared to last month's study. Uncovertebral and facet spurring again result in multilevel neural foraminal stenosis, moderate to severe on the left at C2-3 and on the right at C3-4. Upper chest: Partially visualized right PICC. Aortic atherosclerosis. Small right pleural effusion CT THORACIC SPINE FINDINGS Alignment: 4 mm anterolisthesis of T10 on T11, new/increased from prior. Vertebrae: There are new erosive endplate changes at [id number] disc space collapse. Right T10-11 facet joint widening is unchanged with slightly increased erosion of the right T10 inferior facet. Interval T8-L1 posterior fusion has been performed. Pedicle screws appear well-positioned without evidence of loosening. No acute fracture is identified. Paraspinal and other soft tissues: Paravertebral edema/phlegmonous changes from T9-T12 are greater on the right than on the left and are stable to slightly increased. No organized fluid collection is identified. There is a small right pleural effusion, and there is dependent subsegmental atelectasis in both lungs. A right PICC terminates in the lower SVC. Aortic and coronary artery atherosclerosis is noted. A dorsal column stimulator enters the canal at T3-4. Disc levels: Unchanged severe disc space narrowing and degenerative endplate changes at TD4-0 Interval T10-11 posterior decompression. Limited assessment for an epidural fluid collection due to streak artifact  from posterior spinal instrumentation. No gross epidural collection above the fused segment. CT LUMBAR SPINE FINDINGS Segmentation: 5 lumbar type vertebrae. Alignment: Normal. Vertebrae: Prior L1-L3 posterior fusion, extended in the interim into the thoracic spine to the T8 level as described above. Prior interbody fusion from L1-2 to L5-S1. No evidence of screw loosening. Solid ankylosis at each level. No acute fracture. No osseous erosion to suggest active osteomyelitis. Paraspinal and other soft tissues: No significant paraspinal inflammatory changes or fluid collection to suggest infection. Mild aortoiliac atherosclerosis. Disc levels: Unchanged osseous neural foraminal stenosis at multiple levels, moderate on the left at L5-S1 and milder elsewhere as previously described. No gross spinal stenosis. IMPRESSION: 1. New endplate erosion at TC14-48with stable to slightly increased paravertebral inflammation consistent with known discitis-osteomyelitis. Limited assessment for epidural abscess. 2. Mild interval osseous erosion involving the right T10-11 facet joint which may reflect septic  arthritis. 3. No findings to suggest interval development of infection elsewhere in the cervical, thoracic, or lumbar spine. 4. Interval posterior thoracolumbar fusion as above. Electronically Signed   By: Logan Bores M.D.   On: 05/11/2018 09:18   Ct Thoracic Spine Wo Contrast  Result Date: 05/12/2018 CLINICAL DATA:  Worsening paraplegia.  History of spinal infection. FLUOROSCOPY TIME:  1 minutes and 40 seconds.  Fifteen spot films. PROCEDURE: LUMBAR PUNCTURE FOR CERVICAL LUMBAR AND THORACIC MYELOGRAM CERVICAL AND LUMBAR AND THORACIC MYELOGRAM CT CERVICAL MYELOGRAM CT LUMBAR MYELOGRAM CT THORACIC MYELOGRAM After thorough discussion of risks and benefits of the procedure including bleeding, infection, injury to nerves, blood vessels, adjacent structures as well as headache and CSF leak, written and oral informed consent was  obtained. Consent was obtained by Dr. Rolla Flatten. Patient was positioned prone on the fluoroscopy table. Local anesthesia was provided with 1% lidocaine without epinephrine after prepped and draped in the usual sterile fashion. Puncture was performed at L2-L3 using a 3 1/2 inch 20 gauge spinal needle via midline approach. Using a single pass through the dura, the needle was placed within the thecal sac, with return of clear CSF. 10 mL Isovue M-300 was injected into the thecal sac, with normal opacification of the nerve roots and cauda equina consistent with free flow within the subarachnoid space. The patient was then moved to the trendelenburg position and contrast flowed into the Thoracic and Cervical spine regions. I personally performed the lumbar puncture and administered the intrathecal contrast. I also personally supervised acquisition of the myelogram images. TECHNIQUE: Contiguous axial images were obtained through the Cervical, Thoracic, and Lumbar spine after the intrathecal infusion of infusion. Coronal and sagittal reconstructions were obtained of the axial image sets. FINDINGS: TOTAL MYELOGRAM FINDINGS: Good opacification lumbar subarachnoid space. No stenosis or blockage. Hardware intact. Patient was placed in lateral decubitus and contrast maneuvered into the thoracic region where no block was evident. Patient was further tilted down in contrast maneuvered into the cervical region where no block was present. Dorsal column stimulator leads spanned the cervicothoracic junction. Patient was escorted to CT scanning for further evaluation. CT CERVICAL MYELOGRAM FINDINGS: Alignment: Anatomic except for mild facet mediated slip C2-C3, adjacent segment. Vertebrae: No worrisome osseous lesion.  Solid arthrodesis C3-C6. Cord: Dorsal column stimulator terminating at C4. No cord compression. Paraspinal tissues: Unremarkable. Disc levels: Unchanged disc and facet degeneration compared to last month study.  Uncovertebral and facet spurring result in multilevel neural foraminal stenosis moderate to severe on the LEFT at C2-3 and moderate to severe on the RIGHT at C3-4. CT LUMBAR MYELOGRAM FINDINGS: Segmentation: Normal. Alignment:  Normal. Vertebrae: No worrisome osseous lesion.L1-2 S1 fusion appears solid. Conus medullaris: Normal in size and location. Paraspinal tissues: No evidence for hydronephrosis or paravertebral mass. Disc levels: Unchanged osseous neural foraminal stenosis at multiple levels, moderate on the LEFT at L5-S1. No significant spinal stenosis. CT THORACIC MYELOGRAM FINDINGS: Alignment: 4 mm anterolisthesis T10 on T11, new/increased from prior. Vertebrae: Erosive endplate changes at U44-03, without disc space collapse. T8-L1 fusion posteriorly. Pedicle screws in good position. Paraspinal tissues: Phlegmonous changes from T9 through T12 greater on the RIGHT. Small pleural effusion.Cord: No cord compression at any level. No significant ventral epidural process at T10-11. Cord: No cord compression, cord enlargement, or significant ventral epidural process at any level. Disc levels: Unchanged severe disc space narrowing and degenerative change at T2-T3. Interval T10-T11 posterior decompression. IMPRESSION: Total myelogram with cervical, thoracic and lumbar postmyelogram CT demonstrates no compressive lesion  of the cord at any level. Spinal stimulator device, along with multilevel multisegment fusion hardware appear appropriately placed without adverse features. There is probably some residual infection and paravertebral phlegmon at the T10-11 interspace, but there is no ventral epidural hematoma. Electronically Signed   By: Staci Righter M.D.   On: 05/12/2018 17:38   Ct Thoracic Spine W Contrast  Result Date: 05/11/2018 CLINICAL DATA:  History of discitis-osteomyelitis with epidural abscess at T10-11. Status post T10-11 decompression and debridement with thoracolumbar fusion on 03/28/2018. Worsening  lower extremity weakness. EXAM: CT CERVICAL, THORACIC, AND LUMBAR SPINE WITH CONTRAST TECHNIQUE: Multidetector CT imaging of the cervical, lumbar, and thoracic spine was performed with intravenous contrast. Multiplanar CT image reconstructions were also generated. CONTRAST:  196m ISOVUE-300 IOPAMIDOL (ISOVUE-300) INJECTION 61% COMPARISON:  Cervical spine CT 03/22/2018. Thoracic and lumbar spine CT 03/20/2018. FINDINGS: CT CERVICAL SPINE FINDINGS Alignment: Unchanged cervical spine straightening and trace anterolisthesis of C2 on C3. Skull base and vertebrae: No acute fracture. No interval osseous erosion to suggest infectious discitis-osteomyelitis. Solid C3-C6 ACDF. Soft tissues and spinal canal: Unchanged dorsal column stimulator terminating at the C4 level. No gross epidural fluid collection, however artifact from the stimulator and fusion plate and screws limits assessment. No evidence of significant paraspinal or prevertebral inflammatory changes or fluid collection. Disc levels: Unchanged disc and facet degeneration compared to last month's study. Uncovertebral and facet spurring again result in multilevel neural foraminal stenosis, moderate to severe on the left at C2-3 and on the right at C3-4. Upper chest: Partially visualized right PICC. Aortic atherosclerosis. Small right pleural effusion CT THORACIC SPINE FINDINGS Alignment: 4 mm anterolisthesis of T10 on T11, new/increased from prior. Vertebrae: There are new erosive endplate changes at TN62-95without disc space collapse. Right T10-11 facet joint widening is unchanged with slightly increased erosion of the right T10 inferior facet. Interval T8-L1 posterior fusion has been performed. Pedicle screws appear well-positioned without evidence of loosening. No acute fracture is identified. Paraspinal and other soft tissues: Paravertebral edema/phlegmonous changes from T9-T12 are greater on the right than on the left and are stable to slightly increased. No  organized fluid collection is identified. There is a small right pleural effusion, and there is dependent subsegmental atelectasis in both lungs. A right PICC terminates in the lower SVC. Aortic and coronary artery atherosclerosis is noted. A dorsal column stimulator enters the canal at T3-4. Disc levels: Unchanged severe disc space narrowing and degenerative endplate changes at TM8-4 Interval T10-11 posterior decompression. Limited assessment for an epidural fluid collection due to streak artifact from posterior spinal instrumentation. No gross epidural collection above the fused segment. CT LUMBAR SPINE FINDINGS Segmentation: 5 lumbar type vertebrae. Alignment: Normal. Vertebrae: Prior L1-L3 posterior fusion, extended in the interim into the thoracic spine to the T8 level as described above. Prior interbody fusion from L1-2 to L5-S1. No evidence of screw loosening. Solid ankylosis at each level. No acute fracture. No osseous erosion to suggest active osteomyelitis. Paraspinal and other soft tissues: No significant paraspinal inflammatory changes or fluid collection to suggest infection. Mild aortoiliac atherosclerosis. Disc levels: Unchanged osseous neural foraminal stenosis at multiple levels, moderate on the left at L5-S1 and milder elsewhere as previously described. No gross spinal stenosis. IMPRESSION: 1. New endplate erosion at TX32-44with stable to slightly increased paravertebral inflammation consistent with known discitis-osteomyelitis. Limited assessment for epidural abscess. 2. Mild interval osseous erosion involving the right T10-11 facet joint which may reflect septic arthritis. 3. No findings to suggest interval development of infection  elsewhere in the cervical, thoracic, or lumbar spine. 4. Interval posterior thoracolumbar fusion as above. Electronically Signed   By: Logan Bores M.D.   On: 05/11/2018 09:18   Ct Lumbar Spine Wo Contrast  Result Date: 05/12/2018 CLINICAL DATA:  Worsening  paraplegia.  History of spinal infection. FLUOROSCOPY TIME:  1 minutes and 40 seconds.  Fifteen spot films. PROCEDURE: LUMBAR PUNCTURE FOR CERVICAL LUMBAR AND THORACIC MYELOGRAM CERVICAL AND LUMBAR AND THORACIC MYELOGRAM CT CERVICAL MYELOGRAM CT LUMBAR MYELOGRAM CT THORACIC MYELOGRAM After thorough discussion of risks and benefits of the procedure including bleeding, infection, injury to nerves, blood vessels, adjacent structures as well as headache and CSF leak, written and oral informed consent was obtained. Consent was obtained by Dr. Rolla Flatten. Patient was positioned prone on the fluoroscopy table. Local anesthesia was provided with 1% lidocaine without epinephrine after prepped and draped in the usual sterile fashion. Puncture was performed at L2-L3 using a 3 1/2 inch 20 gauge spinal needle via midline approach. Using a single pass through the dura, the needle was placed within the thecal sac, with return of clear CSF. 10 mL Isovue M-300 was injected into the thecal sac, with normal opacification of the nerve roots and cauda equina consistent with free flow within the subarachnoid space. The patient was then moved to the trendelenburg position and contrast flowed into the Thoracic and Cervical spine regions. I personally performed the lumbar puncture and administered the intrathecal contrast. I also personally supervised acquisition of the myelogram images. TECHNIQUE: Contiguous axial images were obtained through the Cervical, Thoracic, and Lumbar spine after the intrathecal infusion of infusion. Coronal and sagittal reconstructions were obtained of the axial image sets. FINDINGS: TOTAL MYELOGRAM FINDINGS: Good opacification lumbar subarachnoid space. No stenosis or blockage. Hardware intact. Patient was placed in lateral decubitus and contrast maneuvered into the thoracic region where no block was evident. Patient was further tilted down in contrast maneuvered into the cervical region where no block was  present. Dorsal column stimulator leads spanned the cervicothoracic junction. Patient was escorted to CT scanning for further evaluation. CT CERVICAL MYELOGRAM FINDINGS: Alignment: Anatomic except for mild facet mediated slip C2-C3, adjacent segment. Vertebrae: No worrisome osseous lesion.  Solid arthrodesis C3-C6. Cord: Dorsal column stimulator terminating at C4. No cord compression. Paraspinal tissues: Unremarkable. Disc levels: Unchanged disc and facet degeneration compared to last month study. Uncovertebral and facet spurring result in multilevel neural foraminal stenosis moderate to severe on the LEFT at C2-3 and moderate to severe on the RIGHT at C3-4. CT LUMBAR MYELOGRAM FINDINGS: Segmentation: Normal. Alignment:  Normal. Vertebrae: No worrisome osseous lesion.L1-2 S1 fusion appears solid. Conus medullaris: Normal in size and location. Paraspinal tissues: No evidence for hydronephrosis or paravertebral mass. Disc levels: Unchanged osseous neural foraminal stenosis at multiple levels, moderate on the LEFT at L5-S1. No significant spinal stenosis. CT THORACIC MYELOGRAM FINDINGS: Alignment: 4 mm anterolisthesis T10 on T11, new/increased from prior. Vertebrae: Erosive endplate changes at T05-69, without disc space collapse. T8-L1 fusion posteriorly. Pedicle screws in good position. Paraspinal tissues: Phlegmonous changes from T9 through T12 greater on the RIGHT. Small pleural effusion.Cord: No cord compression at any level. No significant ventral epidural process at T10-11. Cord: No cord compression, cord enlargement, or significant ventral epidural process at any level. Disc levels: Unchanged severe disc space narrowing and degenerative change at T2-T3. Interval T10-T11 posterior decompression. IMPRESSION: Total myelogram with cervical, thoracic and lumbar postmyelogram CT demonstrates no compressive lesion of the cord at any level. Spinal stimulator device, along  with multilevel multisegment fusion hardware  appear appropriately placed without adverse features. There is probably some residual infection and paravertebral phlegmon at the T10-11 interspace, but there is no ventral epidural hematoma. Electronically Signed   By: Staci Righter M.D.   On: 05/12/2018 17:38   Ct Lumbar Spine W Contrast  Result Date: 05/11/2018 CLINICAL DATA:  History of discitis-osteomyelitis with epidural abscess at T10-11. Status post T10-11 decompression and debridement with thoracolumbar fusion on 03/28/2018. Worsening lower extremity weakness. EXAM: CT CERVICAL, THORACIC, AND LUMBAR SPINE WITH CONTRAST TECHNIQUE: Multidetector CT imaging of the cervical, lumbar, and thoracic spine was performed with intravenous contrast. Multiplanar CT image reconstructions were also generated. CONTRAST:  144m ISOVUE-300 IOPAMIDOL (ISOVUE-300) INJECTION 61% COMPARISON:  Cervical spine CT 03/22/2018. Thoracic and lumbar spine CT 03/20/2018. FINDINGS: CT CERVICAL SPINE FINDINGS Alignment: Unchanged cervical spine straightening and trace anterolisthesis of C2 on C3. Skull base and vertebrae: No acute fracture. No interval osseous erosion to suggest infectious discitis-osteomyelitis. Solid C3-C6 ACDF. Soft tissues and spinal canal: Unchanged dorsal column stimulator terminating at the C4 level. No gross epidural fluid collection, however artifact from the stimulator and fusion plate and screws limits assessment. No evidence of significant paraspinal or prevertebral inflammatory changes or fluid collection. Disc levels: Unchanged disc and facet degeneration compared to last month's study. Uncovertebral and facet spurring again result in multilevel neural foraminal stenosis, moderate to severe on the left at C2-3 and on the right at C3-4. Upper chest: Partially visualized right PICC. Aortic atherosclerosis. Small right pleural effusion CT THORACIC SPINE FINDINGS Alignment: 4 mm anterolisthesis of T10 on T11, new/increased from prior. Vertebrae: There are new  erosive endplate changes at TC62-37without disc space collapse. Right T10-11 facet joint widening is unchanged with slightly increased erosion of the right T10 inferior facet. Interval T8-L1 posterior fusion has been performed. Pedicle screws appear well-positioned without evidence of loosening. No acute fracture is identified. Paraspinal and other soft tissues: Paravertebral edema/phlegmonous changes from T9-T12 are greater on the right than on the left and are stable to slightly increased. No organized fluid collection is identified. There is a small right pleural effusion, and there is dependent subsegmental atelectasis in both lungs. A right PICC terminates in the lower SVC. Aortic and coronary artery atherosclerosis is noted. A dorsal column stimulator enters the canal at T3-4. Disc levels: Unchanged severe disc space narrowing and degenerative endplate changes at TS2-8 Interval T10-11 posterior decompression. Limited assessment for an epidural fluid collection due to streak artifact from posterior spinal instrumentation. No gross epidural collection above the fused segment. CT LUMBAR SPINE FINDINGS Segmentation: 5 lumbar type vertebrae. Alignment: Normal. Vertebrae: Prior L1-L3 posterior fusion, extended in the interim into the thoracic spine to the T8 level as described above. Prior interbody fusion from L1-2 to L5-S1. No evidence of screw loosening. Solid ankylosis at each level. No acute fracture. No osseous erosion to suggest active osteomyelitis. Paraspinal and other soft tissues: No significant paraspinal inflammatory changes or fluid collection to suggest infection. Mild aortoiliac atherosclerosis. Disc levels: Unchanged osseous neural foraminal stenosis at multiple levels, moderate on the left at L5-S1 and milder elsewhere as previously described. No gross spinal stenosis. IMPRESSION: 1. New endplate erosion at TB15-17with stable to slightly increased paravertebral inflammation consistent with known  discitis-osteomyelitis. Limited assessment for epidural abscess. 2. Mild interval osseous erosion involving the right T10-11 facet joint which may reflect septic arthritis. 3. No findings to suggest interval development of infection elsewhere in the cervical, thoracic, or lumbar spine. 4.  Interval posterior thoracolumbar fusion as above. Electronically Signed   By: Logan Bores M.D.   On: 05/11/2018 09:18   Dg Chest Portable 1 View  Result Date: 05/08/2018 CLINICAL DATA:  Verify PICC line placement. EXAM: PORTABLE CHEST 1 VIEW COMPARISON:  Apr 25, 2018 FINDINGS: A right PICC line is identified. The distal tip is difficult to visualize but appears to terminate in the central SVC. No change in the cardiomediastinal silhouette. No pneumothorax. No other acute interval changes. IMPRESSION: The right PICC line appears to be in good position. The distal tip is difficult to visualize with confidence but appears to be in the central SVC. Electronically Signed   By: Dorise Bullion III M.D   On: 05/08/2018 16:03   Dg Myelography Lumbar Inj Thoracic  Result Date: 05/12/2018 CLINICAL DATA:  Worsening paraplegia.  History of spinal infection. FLUOROSCOPY TIME:  1 minutes and 40 seconds.  Fifteen spot films. PROCEDURE: LUMBAR PUNCTURE FOR CERVICAL LUMBAR AND THORACIC MYELOGRAM CERVICAL AND LUMBAR AND THORACIC MYELOGRAM CT CERVICAL MYELOGRAM CT LUMBAR MYELOGRAM CT THORACIC MYELOGRAM After thorough discussion of risks and benefits of the procedure including bleeding, infection, injury to nerves, blood vessels, adjacent structures as well as headache and CSF leak, written and oral informed consent was obtained. Consent was obtained by Dr. Rolla Flatten. Patient was positioned prone on the fluoroscopy table. Local anesthesia was provided with 1% lidocaine without epinephrine after prepped and draped in the usual sterile fashion. Puncture was performed at L2-L3 using a 3 1/2 inch 20 gauge spinal needle via midline approach.  Using a single pass through the dura, the needle was placed within the thecal sac, with return of clear CSF. 10 mL Isovue M-300 was injected into the thecal sac, with normal opacification of the nerve roots and cauda equina consistent with free flow within the subarachnoid space. The patient was then moved to the trendelenburg position and contrast flowed into the Thoracic and Cervical spine regions. I personally performed the lumbar puncture and administered the intrathecal contrast. I also personally supervised acquisition of the myelogram images. TECHNIQUE: Contiguous axial images were obtained through the Cervical, Thoracic, and Lumbar spine after the intrathecal infusion of infusion. Coronal and sagittal reconstructions were obtained of the axial image sets. FINDINGS: TOTAL MYELOGRAM FINDINGS: Good opacification lumbar subarachnoid space. No stenosis or blockage. Hardware intact. Patient was placed in lateral decubitus and contrast maneuvered into the thoracic region where no block was evident. Patient was further tilted down in contrast maneuvered into the cervical region where no block was present. Dorsal column stimulator leads spanned the cervicothoracic junction. Patient was escorted to CT scanning for further evaluation. CT CERVICAL MYELOGRAM FINDINGS: Alignment: Anatomic except for mild facet mediated slip C2-C3, adjacent segment. Vertebrae: No worrisome osseous lesion.  Solid arthrodesis C3-C6. Cord: Dorsal column stimulator terminating at C4. No cord compression. Paraspinal tissues: Unremarkable. Disc levels: Unchanged disc and facet degeneration compared to last month study. Uncovertebral and facet spurring result in multilevel neural foraminal stenosis moderate to severe on the LEFT at C2-3 and moderate to severe on the RIGHT at C3-4. CT LUMBAR MYELOGRAM FINDINGS: Segmentation: Normal. Alignment:  Normal. Vertebrae: No worrisome osseous lesion.L1-2 S1 fusion appears solid. Conus medullaris: Normal in  size and location. Paraspinal tissues: No evidence for hydronephrosis or paravertebral mass. Disc levels: Unchanged osseous neural foraminal stenosis at multiple levels, moderate on the LEFT at L5-S1. No significant spinal stenosis. CT THORACIC MYELOGRAM FINDINGS: Alignment: 4 mm anterolisthesis T10 on T11, new/increased from prior. Vertebrae: Erosive endplate  changes at T10-11, without disc space collapse. T8-L1 fusion posteriorly. Pedicle screws in good position. Paraspinal tissues: Phlegmonous changes from T9 through T12 greater on the RIGHT. Small pleural effusion.Cord: No cord compression at any level. No significant ventral epidural process at T10-11. Cord: No cord compression, cord enlargement, or significant ventral epidural process at any level. Disc levels: Unchanged severe disc space narrowing and degenerative change at T2-T3. Interval T10-T11 posterior decompression. IMPRESSION: Total myelogram with cervical, thoracic and lumbar postmyelogram CT demonstrates no compressive lesion of the cord at any level. Spinal stimulator device, along with multilevel multisegment fusion hardware appear appropriately placed without adverse features. There is probably some residual infection and paravertebral phlegmon at the T10-11 interspace, but there is no ventral epidural hematoma. Electronically Signed   By: Staci Righter M.D.   On: 05/12/2018 17:38   Dg Hip Unilat With Pelvis 2-3 Views Right  Result Date: 05/08/2018 CLINICAL DATA:  Fall, right hip pain EXAM: DG HIP (WITH OR WITHOUT PELVIS) 2-3V RIGHT COMPARISON:  None. FINDINGS: No fracture or dislocation is seen. Bilateral hip joint spaces are preserved. Visualized bony pelvis appears intact. Degenerative changes at L5-S1. IMPRESSION: Negative. Electronically Signed   By: Julian Hy M.D.   On: 05/08/2018 16:53   [place] Ekg Site Rite  Result Date: 04/25/2018 If Site Rite image not attached, placement could not be confirmed due to current cardiac  rhythm.   Lab Data:  CBC: Recent Labs  Lab 05/08/18 1839  05/11/18 0659 05/12/18 0522 05/13/18 0236 05/14/18 0408 05/15/18 0411  WBC 12.0*   < > 8.0 5.7 7.4 7.8 7.3  NEUTROABS 8.7*  --   --   --   --   --   --   HGB 10.0*   < > 9.4* 9.1* 8.9* 9.2* 9.1*  HCT 34.5*   < > 33.0* 31.8* 31.7* 32.5* 32.2*  MCV 82.5   < > 83.5 83.9 83.4 82.1 81.9  PLT 579*   < > 579* 589* 552* 617* 578*   < > = values in this interval not displayed.   Basic Metabolic Panel: Recent Labs  Lab 05/08/18 1839 05/09/18 0633 05/10/18 0933 05/11/18 0659 05/12/18 0522 05/13/18 0236  NA 134*  --  135  --  137 138  K 4.0  --  3.9  --  3.8 3.7  CL 100*  --  99*  --  102 105  CO2 26  --  29  --  27 27  GLUCOSE 116*  --  109*  --  77 126*  BUN 10  --  9  --  12 6  CREATININE 0.79  --  0.74  --  0.64 0.77  CALCIUM 8.8*  --  8.9  --  8.7* 8.7*  MG  --  1.7 1.7 2.0  --   --    GFR: Estimated Creatinine Clearance: 120.9 mL/min (by C-G formula based on SCr of 0.77 mg/dL). Liver Function Tests: Recent Labs  Lab 05/08/18 1839  AST 12*  ALT 7*  ALKPHOS 99  BILITOT 0.5  PROT 7.5  ALBUMIN 2.7*   No results for input(s): LIPASE, AMYLASE in the last 168 hours. No results for input(s): AMMONIA in the last 168 hours. Coagulation Profile: Recent Labs  Lab 05/11/18 1953 05/12/18 0522 05/13/18 0236 05/14/18 0408 05/15/18 0411  INR 1.54 1.30 1.18 1.19 1.25   Cardiac Enzymes: Recent Labs  Lab 05/11/18 0659  CKTOTAL 15*   BNP (last 3 results) No results for input(s): PROBNP  in the last 8760 hours. HbA1C: No results for input(s): HGBA1C in the last 72 hours. CBG: No results for input(s): GLUCAP in the last 168 hours. Lipid Profile: No results for input(s): CHOL, HDL, LDLCALC, TRIG, CHOLHDL, LDLDIRECT in the last 72 hours. Thyroid Function Tests: No results for input(s): TSH, T4TOTAL, FREET4, T3FREE, THYROIDAB in the last 72 hours. Anemia Panel: No results for input(s): VITAMINB12, FOLATE,  FERRITIN, TIBC, IRON, RETICCTPCT in the last 72 hours. Urine analysis:    Component Value Date/Time   COLORURINE AMBER (A) 04/05/2018 1621   APPEARANCEUR CLEAR 04/05/2018 1621   LABSPEC 1.029 04/05/2018 1621   PHURINE 5.0 04/05/2018 1621   GLUCOSEU NEGATIVE 04/05/2018 1621   HGBUR NEGATIVE 04/05/2018 1621   BILIRUBINUR MODERATE (A) 04/05/2018 1621   KETONESUR NEGATIVE 04/05/2018 1621   PROTEINUR 30 (A) 04/05/2018 1621   UROBILINOGEN 0.2 03/05/2011 1909   NITRITE NEGATIVE 04/05/2018 1621   LEUKOCYTESUR NEGATIVE 04/05/2018 1621     Westlyn Glaza M.D. [facility] 05/15/2018, 11:57 AM  Pager: [phone] Between 7am to 7pm - call Pager - [phone]  After 7pm go to [email or web address] - password [email or web address]  Call night coverage person covering after 7pm

## 2018-05-15 NOTE — Plan of Care (Signed)
Patient remains positive, still wanting to go to [facility]. Family updated at bedside.  Patient remains on Heparin gtt

## 2018-05-15 NOTE — Progress Notes (Signed)
Rehab admissions - Please see rehab consult done by Dr. Letta Pate recommending SNF placement.  I met with patient and his wife and they are upset that patient cannot re-admit to [facility]. He was on [facility] for about 5 weeks up until 05/06/18. I did talk with Dr. Letta Pate and with Dr. Naaman Plummer about this case.  Both rehab physicians are in agreement that patient does not have the medical necessity to support an additional inpatient rehab admission.  Recommend pursuit of SNF.  I will meet with patient again in am to reinforce the above decision.  Call me for questions.  #[phone]

## 2018-05-15 NOTE — Progress Notes (Signed)
ANTICOAGULATION CONSULT NOTE - Follow Up Consult  Pharmacy Consult for Heparin/Coumadin Indication: DVT  No Known Allergies  Patient Measurements: Height: 6\' 3"  (190.5 cm) Weight: 276 lb 14.4 oz (125.6 kg) IBW/kg (Calculated) : 84.5 Heparin Dosing Weight:    Vital Signs: Temp: 98.2 F (36.8 C) (05/27 0830) Temp Source: Oral (05/27 0830) BP: 144/83 (05/27 0830) Pulse Rate: 79 (05/27 0830)  Labs: Recent Labs    05/13/18 0236  05/13/18 2320 05/14/18 0408 05/15/18 0411  HGB 8.9*  --   --  9.2* 9.1*  HCT 31.7*  --   --  32.5* 32.2*  PLT 552*  --   --  617* 578*  LABPROT 14.9  --   --  15.0 15.6*  INR 1.18  --   --  1.19 1.25  HEPARINUNFRC 0.16*   < > 0.43 0.38 0.36  CREATININE 0.77  --   --   --   --    < > = values in this interval not displayed.    Estimated Creatinine Clearance: 120.9 mL/min (by C-G formula based on SCr of 0.77 mg/dL).  Assessment: Anticoag: DVT: Heparin level 0.36, Coumadin INR 1.25. Hgb 9.1 stable. Plts 578. PTA warfarin 10mg  MF, 7.5mg  all other days,  Goal of Therapy:  Heparin level 0.3-0.7 units/ml  INR 2-3 Monitor platelets by anticoagulation protocol: Yes   Plan:  Heparin 2400 units/hr Warfarin 10 mg PO x 1 Daily INR, CBC, Heparin level  Cody Velez, PharmD, BCPS Clinical Staff Pharmacist Pager [phone]  Cody Velez 05/15/2018,9:24 AM

## 2018-05-16 LAB — BASIC METABOLIC PANEL
Anion gap: 8 (ref 5–15)
BUN: 7 mg/dL (ref 6–20)
CO2: 27 mmol/L (ref 22–32)
Calcium: 8.8 mg/dL — ABNORMAL LOW (ref 8.9–10.3)
Chloride: 102 mmol/L (ref 101–111)
Creatinine, Ser: 0.81 mg/dL (ref 0.61–1.24)
GFR calc Af Amer: >60 mL/min (ref >60)
GFR calc non Af Amer: >60 mL/min (ref >60)
Glucose, Bld: 89 mg/dL (ref 65–99)
Potassium: 4.1 mmol/L (ref 3.5–5.1)
Sodium: 137 mmol/L (ref 135–145)

## 2018-05-16 LAB — CBC
HCT: 33.4 % — ABNORMAL LOW (ref 39.0–52.0)
Hemoglobin: 9.5 g/dL — ABNORMAL LOW (ref 13.0–17.0)
MCH: 23.6 pg — ABNORMAL LOW (ref 26.0–34.0)
MCHC: 28.4 g/dL — ABNORMAL LOW (ref 30.0–36.0)
MCV: 83.1 fL (ref 78.0–100.0)
Platelets: 587 10*3/uL — ABNORMAL HIGH (ref 150–400)
RBC: 4.02 MIL/uL — ABNORMAL LOW (ref 4.22–5.81)
RDW: 16.9 % — ABNORMAL HIGH (ref 11.5–15.5)
WBC: 8.8 10*3/uL (ref 4.0–10.5)

## 2018-05-16 LAB — HEPARIN LEVEL (UNFRACTIONATED): Heparin Unfractionated: 0.37 IU/mL (ref 0.30–0.70)

## 2018-05-16 LAB — PROTIME-INR
INR: 1.24
Prothrombin Time: 15.5 seconds — ABNORMAL HIGH (ref 11.4–15.2)

## 2018-05-16 MED ORDER — OXYCODONE HCL 10 MG PO TABS: 10.0000 mg | ORAL_TABLET | ORAL | 0 refills | Status: DC | PRN

## 2018-05-16 MED ORDER — HEPARIN (PORCINE) IN NACL 100-0.45 UNIT/ML-% IJ SOLN
INTRAMUSCULAR | Status: AC
  Filled 2018-05-16: qty 250

## 2018-05-16 MED ORDER — WARFARIN SODIUM 5 MG PO TABS
10.0000 mg | ORAL_TABLET | Freq: Once | ORAL | Status: AC
  Administered 2018-05-16: 10 mg via ORAL
  Filled 2018-05-16: qty 2

## 2018-05-16 MED ORDER — BACLOFEN 10 MG PO TABS
5.0000 mg | ORAL_TABLET | Freq: Three times a day (TID) | ORAL | Status: DC
  Administered 2018-05-16 – 2018-05-22 (×19): 5 mg via ORAL
  Filled 2018-05-16 (×19): qty 1

## 2018-05-16 NOTE — Progress Notes (Signed)
[facility]                                                                              Patient Demographics  Cody Velez, is a 72 y.o. male, DOB - 1946-10-26, DJS:[id number]  Admit date - 05/08/2018   Admitting Physician Phillips Grout, MD  Outpatient Primary MD for the patient is Asencion Noble, MD  Outpatient specialists:   LOS - 5  days   Medical records reviewed and are as summarized below:    Chief Complaint  Patient presents with  . Fall       Brief summary   Patient is a 72 year old male with hypertension, hyperlipidemia, morbid obesity, cad, chronic back pain status post spinal stimulator(C-spine) placed 1/30/19presented withincreasing mechanical falls at home. Patient was recently discharged from [place] a stay from 03/30/2018 through 05/06/2018.  The patient was initially admitted in early April 2019, found to have MSSA bacteremia. Extensive work-up at that time revealed epidural abscess and T10-11 osteomyelitis and discitis, underwent laminectomy, decompression of epidural abscess, and methacrylate screw augmentation on 03/28/2018 performed by Dr. Ellene Route.The patient was followed by infectious disease. They recommended cefazolin and rifampin for 8 weeks to be completed on 05/22/2018. He was subsequently discharged to inpatient rehabilitation on 03/30/2018.  Patient was making significant progress at that time. However since discharge home, the patient has noted increasing lower extremity weakness and increasing falling.  Patient was transferred to [facility] for myelogram and neurosurgery evaluation.     Assessment & Plan   Bilateral lower extremity weakness, falls  -CT cervical, thoracic, lumbar spine 5/22 showed new endplate erosion Y85-02 with a increased paravertebral inflammation, concern for septic arthritis -Myelogram completed on 5/24, reviewed by Dr. Ellene Route per neurosurgery, patient does not have evidence of canal compromise.,   Spinal cord compression epidural collections or neuro compromise from an anatomic basis. - Recommended neurology consult, seen by Dr. Orlena Sheldon, recommended PT OT evaluation for rehab -PT recommended inpatient rehab, evaluated by CIR but denied.  Patient and wife somewhat upset but agreeable to pursue other facilities.  Social work consulted. -Added baclofen for right leg spasms  Epidural abscess/vertebral osteomyelitis/discitis -Blood cultures had grown MSSA previously,  - continue IV cefazolin, rifampin, tentative stop date on 05/22/2018 -Status post I&D and laminectomy on 4/9  Acute DVT right peroneal vein, left soleal vein -Continue IV heparin bridge with warfarin per pharmacy -INR 1.24  Essential hypertension Continue amlodipine, lisinopril, stable  Chronic pain syndrome -Currently stable, continue gabapentin   Depression Continue home dose of venlafaxine  Hyperlipidemia Continue statin  Coronary artery disease Currently no symptoms, no chest pain or shortness of breath  Cognitive impairment, possible underlying dementia Continue Aricept, Namenda  Code Status: Full code DVT Prophylaxis: Heparin plus Coumadin Family Communication: Discussed in detail with the patient, all imaging results, lab results explained to the patient and wife at the bedside.     Disposition Plan: Denied by CIR, awaiting other facilities  Time Spent in minutes 15 minutes   Procedures:  Myelogram CT spine  Consultants:   Neurology Neurosurgery  Antimicrobials:      Medications  Scheduled Meds: . amLODipine  10 mg Oral Daily  . aspirin  81 mg Oral Daily  . baclofen  5 mg Oral TID  . docusate sodium  200 mg Oral BID  . donepezil  10 mg Oral QHS  . finasteride  5 mg Oral Daily  . gabapentin  400 mg Oral TID  . latanoprost  1 drop Both Eyes QHS  . lidocaine (PF)  5 mL Other Once  . linaclotide  290 mcg Oral QAC breakfast  . lisinopril  5 mg Oral Daily  . Melatonin  1.5 mg Oral  QHS  . memantine  10 mg Oral BID  . pantoprazole  40 mg Oral Daily  . polyethylene glycol  17 g Oral Daily  . potassium chloride SA  20 mEq Oral Daily  . pravastatin  10 mg Oral QPM  . rifampin  300 mg Oral Q12H  . sodium chloride flush  3 mL Intravenous Q12H  . traZODone  100 mg Oral QHS  . venlafaxine XR  150 mg Oral BID  . vitamin B-12  1,000 mcg Oral Daily  . warfarin  10 mg Oral ONCE-1800  . Warfarin - Pharmacist Dosing Inpatient   Does not apply q1800   Continuous Infusions: . sodium chloride 10 mL/hr at 05/15/18 1900  .  ceFAZolin (ANCEF) IV 2 g (05/16/18 1116)  . heparin Stopped (05/16/18 0802)  . heparin 2,400 Units/hr (05/16/18 0755)   PRN Meds:.sodium chloride, fluticasone, oxyCODONE, sodium chloride flush   Antibiotics   Anti-infectives (From admission, onward)   Start     Dose/Rate Route Frequency Ordered Stop   05/09/18 0100  ceFAZolin (ANCEF) IVPB 2g/100 mL premix     2 g 200 mL/hr over 30 Minutes Intravenous Every 8 hours 05/08/18 2213 05/22/18 1659   05/08/18 2200  rifampin (RIFADIN) capsule 300 mg    Note to Pharmacy:  Continue through 05/22/2018 and stop     300 mg Oral Every 12 hours 05/08/18 2032 05/22/18 2159   05/08/18 2200  ceFAZolin (ANCEF) IVPB  Status:  Discontinued    Note to Pharmacy:  Indication:  MSSA Bacteremia and extensive epidural abscess discitis of thoracic spine Last Day of Therapy: 05/22/2018 Labs - Once weekly:  CBC/D and BMP, Labs - Every other week:  ESR and CRP     2 g Intravenous Every 8 hours 05/08/18 2032 05/08/18 2211   05/08/18 1530  ceFAZolin (ANCEF) 3 g in dextrose 5 % 50 mL IVPB     3 g 100 mL/hr over 30 Minutes Intravenous  Once 05/08/18 1521 05/08/18 1726        Subjective:   Cody Velez was seen and examined today.  Frustrated over not accepted to CIR.  Right leg spasms.  No fevers or chills, chest pain or shortness of breath.   Patient denies dizziness,  N/V/D/C. No acute events overnight.    Objective:    Vitals:   05/15/18 2005 05/15/18 2345 05/16/18 0351 05/16/18 0805  BP: 130/65 (!) 111/58 127/60 132/78  Pulse: 80 89 77 75  Resp: '18 18 18 16  ' Temp: 98.9 F (37.2 C) 98.1 F (36.7 C) 98 F (36.7 C) 98.6 F (37 C)  TempSrc: Oral Oral Oral Oral  SpO2: 98% 95% 96% 95%  Weight:      Height:        Intake/Output Summary (Last 24 hours) at 05/16/2018 1130 Last data filed at 05/16/2018 0351 Gross per 24 hour  Intake 1554 ml  Output 2150 ml  Net -596 ml  Wt Readings from Last 3 Encounters:  05/15/18 125.6 kg (276 lb 14.4 oz)  05/06/18 125 kg (275 lb 9.2 oz)  03/27/18 130.4 kg (287 lb 7.7 oz)     Exam    General: Alert and oriented x 3, NAD  Eyes:   HEENT:  Atraumatic, normocephalic  Cardiovascular: S1 S2 auscultated,  Regular rate and rhythm. No pedal edema b/l  Respiratory: Clear to auscultation bilaterally, no wheezing, rales or rhonchi  Gastrointestinal: Soft, nontender, nondistended, + bowel sounds  Ext: no pedal edema bilaterally  Neuro: no new deficits, bilateral lower extremity weakness improving  Musculoskeletal: No digital cyanosis, clubbing  Skin: No rashes  Psych: Normal affect and demeanor, alert and oriented x3    Data Reviewed:  I have personally reviewed following labs and imaging studies  Micro Results No results found for this or any previous visit (from the past 240 hour(s)).  Radiology Reports Dg Chest 1 View  Result Date: 04/25/2018 CLINICAL DATA:  Possible malfunction of the right-sided PICC line. EXAM: CHEST  1 VIEW COMPARISON:  Chest x-ray dated March 17, 2018 FINDINGS: The right-sided PICC line tip terminates at the junction of the right and left brachiocephalic veins. The lungs are well-expanded. The interstitial markings are increased. There is no alveolar infiltrate or significant pleural effusion. The heart is normal in size. The pulmonary vascularity is not engorged. There is calcification in the wall of the thoracic aorta.  The patient has undergone previous mid and distal thoracic spinal fusion. There are nerve stimulator electrodes present which terminate in the lower cervical region. IMPRESSION: COPD. Mild interstitial prominence may reflect pulmonary fibrosis or low-grade interstitial edema. Thoracic aortic atherosclerosis. Electronically Signed   By: David  Martinique M.D.   On: 04/25/2018 13:52   Dg Lumbar Spine 2-3 Views  Result Date: 05/08/2018 CLINICAL DATA:  Low back pain after a fall. EXAM: LUMBAR SPINE - 2-3 VIEW COMPARISON:  Spot fluoro films from 03/28/2018. FINDINGS: Patient is status post extensive thoracolumbar fusion posteriorly with incomplete visualization of the thoracolumbar fusion hardware. Image portions of the hardware show no complicating features. Interbody cages are seen at L4-5 and L5-S1. Bones are diffusely demineralized. Although no definite compression deformity is evident, lateral film is limited in utility given the demineralization. IMPRESSION: No evidence for an acute fracture on this limited two-view study. Electronically Signed   By: Misty Stanley M.D.   On: 05/08/2018 16:58   Dg Knee 1-2 Views Left  Result Date: 05/08/2018 CLINICAL DATA:  [place] Saturday landing on bilateral knees. EXAM: RIGHT KNEE - 1-2 VIEW; LEFT KNEE - 1-2 VIEW COMPARISON:  MRI of the LEFT knee November 14, 2009 FINDINGS: LEFT: No acute fracture deformity or dislocation. Moderate to severe tricompartmental joint space narrowing with periarticular sclerosis and marginal spurring. Osteopenia without destructive bony lesions. Small suprapatellar joint effusion. Mild vascular calcifications. RIGHT: No acute fracture deformity dislocation. Moderate tricompartmental joint space narrowing with periarticular sclerosis and marginal spurring. No destructive bony lesions. Osteopenia. Soft tissue planes are nonsuspicious. IMPRESSION: 1. No acute fracture deformity dislocation. Small LEFT suprapatellar joint effusion. 2. Moderate RIGHT  and moderate to severe LEFT tricompartmental osteoarthrosis. Electronically Signed   By: Elon Alas M.D.   On: 05/08/2018 16:55   Dg Knee 1-2 Views Right  Result Date: 05/08/2018 CLINICAL DATA:  [place] landing on bilateral knees. EXAM: RIGHT KNEE - 1-2 VIEW; LEFT KNEE - 1-2 VIEW COMPARISON:  MRI of the LEFT knee November 14, 2009 FINDINGS: LEFT: No acute fracture deformity or dislocation.  Moderate to severe tricompartmental joint space narrowing with periarticular sclerosis and marginal spurring. Osteopenia without destructive bony lesions. Small suprapatellar joint effusion. Mild vascular calcifications. RIGHT: No acute fracture deformity dislocation. Moderate tricompartmental joint space narrowing with periarticular sclerosis and marginal spurring. No destructive bony lesions. Osteopenia. Soft tissue planes are nonsuspicious. IMPRESSION: 1. No acute fracture deformity dislocation. Small LEFT suprapatellar joint effusion. 2. Moderate RIGHT and moderate to severe LEFT tricompartmental osteoarthrosis. Electronically Signed   By: Elon Alas M.D.   On: 05/08/2018 16:55   Ct Cervical Spine Wo Contrast  Result Date: 05/12/2018 CLINICAL DATA:  Worsening paraplegia.  History of spinal infection. FLUOROSCOPY TIME:  1 minutes and 40 seconds.  Fifteen spot films. PROCEDURE: LUMBAR PUNCTURE FOR CERVICAL LUMBAR AND THORACIC MYELOGRAM CERVICAL AND LUMBAR AND THORACIC MYELOGRAM CT CERVICAL MYELOGRAM CT LUMBAR MYELOGRAM CT THORACIC MYELOGRAM After thorough discussion of risks and benefits of the procedure including bleeding, infection, injury to nerves, blood vessels, adjacent structures as well as headache and CSF leak, written and oral informed consent was obtained. Consent was obtained by Dr. Rolla Flatten. Patient was positioned prone on the fluoroscopy table. Local anesthesia was provided with 1% lidocaine without epinephrine after prepped and draped in the usual sterile fashion. Puncture was  performed at L2-L3 using a 3 1/2 inch 20 gauge spinal needle via midline approach. Using a single pass through the dura, the needle was placed within the thecal sac, with return of clear CSF. 10 mL Isovue M-300 was injected into the thecal sac, with normal opacification of the nerve roots and cauda equina consistent with free flow within the subarachnoid space. The patient was then moved to the trendelenburg position and contrast flowed into the Thoracic and Cervical spine regions. I personally performed the lumbar puncture and administered the intrathecal contrast. I also personally supervised acquisition of the myelogram images. TECHNIQUE: Contiguous axial images were obtained through the Cervical, Thoracic, and Lumbar spine after the intrathecal infusion of infusion. Coronal and sagittal reconstructions were obtained of the axial image sets. FINDINGS: TOTAL MYELOGRAM FINDINGS: Good opacification lumbar subarachnoid space. No stenosis or blockage. Hardware intact. Patient was placed in lateral decubitus and contrast maneuvered into the thoracic region where no block was evident. Patient was further tilted down in contrast maneuvered into the cervical region where no block was present. Dorsal column stimulator leads spanned the cervicothoracic junction. Patient was escorted to CT scanning for further evaluation. CT CERVICAL MYELOGRAM FINDINGS: Alignment: Anatomic except for mild facet mediated slip C2-C3, adjacent segment. Vertebrae: No worrisome osseous lesion.  Solid arthrodesis C3-C6. Cord: Dorsal column stimulator terminating at C4. No cord compression. Paraspinal tissues: Unremarkable. Disc levels: Unchanged disc and facet degeneration compared to last month study. Uncovertebral and facet spurring result in multilevel neural foraminal stenosis moderate to severe on the LEFT at C2-3 and moderate to severe on the RIGHT at C3-4. CT LUMBAR MYELOGRAM FINDINGS: Segmentation: Normal. Alignment:  Normal. Vertebrae: No  worrisome osseous lesion.L1-2 S1 fusion appears solid. Conus medullaris: Normal in size and location. Paraspinal tissues: No evidence for hydronephrosis or paravertebral mass. Disc levels: Unchanged osseous neural foraminal stenosis at multiple levels, moderate on the LEFT at L5-S1. No significant spinal stenosis. CT THORACIC MYELOGRAM FINDINGS: Alignment: 4 mm anterolisthesis T10 on T11, new/increased from prior. Vertebrae: Erosive endplate changes at Z61-09, without disc space collapse. T8-L1 fusion posteriorly. Pedicle screws in good position. Paraspinal tissues: Phlegmonous changes from T9 through T12 greater on the RIGHT. Small pleural effusion.Cord: No cord compression at any level. No significant  ventral epidural process at T10-11. Cord: No cord compression, cord enlargement, or significant ventral epidural process at any level. Disc levels: Unchanged severe disc space narrowing and degenerative change at T2-T3. Interval T10-T11 posterior decompression. IMPRESSION: Total myelogram with cervical, thoracic and lumbar postmyelogram CT demonstrates no compressive lesion of the cord at any level. Spinal stimulator device, along with multilevel multisegment fusion hardware appear appropriately placed without adverse features. There is probably some residual infection and paravertebral phlegmon at the T10-11 interspace, but there is no ventral epidural hematoma. Electronically Signed   By: Staci Righter M.D.   On: 05/12/2018 17:38   Ct Cervical Spine W Contrast  Result Date: 05/11/2018 CLINICAL DATA:  History of discitis-osteomyelitis with epidural abscess at T10-11. Status post T10-11 decompression and debridement with thoracolumbar fusion on 03/28/2018. Worsening lower extremity weakness. EXAM: CT CERVICAL, THORACIC, AND LUMBAR SPINE WITH CONTRAST TECHNIQUE: Multidetector CT imaging of the cervical, lumbar, and thoracic spine was performed with intravenous contrast. Multiplanar CT image reconstructions were  also generated. CONTRAST:  193m ISOVUE-300 IOPAMIDOL (ISOVUE-300) INJECTION 61% COMPARISON:  Cervical spine CT 03/22/2018. Thoracic and lumbar spine CT 03/20/2018. FINDINGS: CT CERVICAL SPINE FINDINGS Alignment: Unchanged cervical spine straightening and trace anterolisthesis of C2 on C3. Skull base and vertebrae: No acute fracture. No interval osseous erosion to suggest infectious discitis-osteomyelitis. Solid C3-C6 ACDF. Soft tissues and spinal canal: Unchanged dorsal column stimulator terminating at the C4 level. No gross epidural fluid collection, however artifact from the stimulator and fusion plate and screws limits assessment. No evidence of significant paraspinal or prevertebral inflammatory changes or fluid collection. Disc levels: Unchanged disc and facet degeneration compared to last month's study. Uncovertebral and facet spurring again result in multilevel neural foraminal stenosis, moderate to severe on the left at C2-3 and on the right at C3-4. Upper chest: Partially visualized right PICC. Aortic atherosclerosis. Small right pleural effusion CT THORACIC SPINE FINDINGS Alignment: 4 mm anterolisthesis of T10 on T11, new/increased from prior. Vertebrae: There are new erosive endplate changes at [id number] disc space collapse. Right T10-11 facet joint widening is unchanged with slightly increased erosion of the right T10 inferior facet. Interval T8-L1 posterior fusion has been performed. Pedicle screws appear well-positioned without evidence of loosening. No acute fracture is identified. Paraspinal and other soft tissues: Paravertebral edema/phlegmonous changes from T9-T12 are greater on the right than on the left and are stable to slightly increased. No organized fluid collection is identified. There is a small right pleural effusion, and there is dependent subsegmental atelectasis in both lungs. A right PICC terminates in the lower SVC. Aortic and coronary artery atherosclerosis is noted. A dorsal  column stimulator enters the canal at T3-4. Disc levels: Unchanged severe disc space narrowing and degenerative endplate changes at TN5-6 Interval T10-11 posterior decompression. Limited assessment for an epidural fluid collection due to streak artifact from posterior spinal instrumentation. No gross epidural collection above the fused segment. CT LUMBAR SPINE FINDINGS Segmentation: 5 lumbar type vertebrae. Alignment: Normal. Vertebrae: Prior L1-L3 posterior fusion, extended in the interim into the thoracic spine to the T8 level as described above. Prior interbody fusion from L1-2 to L5-S1. No evidence of screw loosening. Solid ankylosis at each level. No acute fracture. No osseous erosion to suggest active osteomyelitis. Paraspinal and other soft tissues: No significant paraspinal inflammatory changes or fluid collection to suggest infection. Mild aortoiliac atherosclerosis. Disc levels: Unchanged osseous neural foraminal stenosis at multiple levels, moderate on the left at L5-S1 and milder elsewhere as previously described. No gross spinal  stenosis. IMPRESSION: 1. New endplate erosion at C12-75 with stable to slightly increased paravertebral inflammation consistent with known discitis-osteomyelitis. Limited assessment for epidural abscess. 2. Mild interval osseous erosion involving the right T10-11 facet joint which may reflect septic arthritis. 3. No findings to suggest interval development of infection elsewhere in the cervical, thoracic, or lumbar spine. 4. Interval posterior thoracolumbar fusion as above. Electronically Signed   By: Logan Bores M.D.   On: 05/11/2018 09:18   Ct Thoracic Spine Wo Contrast  Result Date: 05/12/2018 CLINICAL DATA:  Worsening paraplegia.  History of spinal infection. FLUOROSCOPY TIME:  1 minutes and 40 seconds.  Fifteen spot films. PROCEDURE: LUMBAR PUNCTURE FOR CERVICAL LUMBAR AND THORACIC MYELOGRAM CERVICAL AND LUMBAR AND THORACIC MYELOGRAM CT CERVICAL MYELOGRAM CT LUMBAR  MYELOGRAM CT THORACIC MYELOGRAM After thorough discussion of risks and benefits of the procedure including bleeding, infection, injury to nerves, blood vessels, adjacent structures as well as headache and CSF leak, written and oral informed consent was obtained. Consent was obtained by Dr. Rolla Flatten. Patient was positioned prone on the fluoroscopy table. Local anesthesia was provided with 1% lidocaine without epinephrine after prepped and draped in the usual sterile fashion. Puncture was performed at L2-L3 using a 3 1/2 inch 20 gauge spinal needle via midline approach. Using a single pass through the dura, the needle was placed within the thecal sac, with return of clear CSF. 10 mL Isovue M-300 was injected into the thecal sac, with normal opacification of the nerve roots and cauda equina consistent with free flow within the subarachnoid space. The patient was then moved to the trendelenburg position and contrast flowed into the Thoracic and Cervical spine regions. I personally performed the lumbar puncture and administered the intrathecal contrast. I also personally supervised acquisition of the myelogram images. TECHNIQUE: Contiguous axial images were obtained through the Cervical, Thoracic, and Lumbar spine after the intrathecal infusion of infusion. Coronal and sagittal reconstructions were obtained of the axial image sets. FINDINGS: TOTAL MYELOGRAM FINDINGS: Good opacification lumbar subarachnoid space. No stenosis or blockage. Hardware intact. Patient was placed in lateral decubitus and contrast maneuvered into the thoracic region where no block was evident. Patient was further tilted down in contrast maneuvered into the cervical region where no block was present. Dorsal column stimulator leads spanned the cervicothoracic junction. Patient was escorted to CT scanning for further evaluation. CT CERVICAL MYELOGRAM FINDINGS: Alignment: Anatomic except for mild facet mediated slip C2-C3, adjacent segment.  Vertebrae: No worrisome osseous lesion.  Solid arthrodesis C3-C6. Cord: Dorsal column stimulator terminating at C4. No cord compression. Paraspinal tissues: Unremarkable. Disc levels: Unchanged disc and facet degeneration compared to last month study. Uncovertebral and facet spurring result in multilevel neural foraminal stenosis moderate to severe on the LEFT at C2-3 and moderate to severe on the RIGHT at C3-4. CT LUMBAR MYELOGRAM FINDINGS: Segmentation: Normal. Alignment:  Normal. Vertebrae: No worrisome osseous lesion.L1-2 S1 fusion appears solid. Conus medullaris: Normal in size and location. Paraspinal tissues: No evidence for hydronephrosis or paravertebral mass. Disc levels: Unchanged osseous neural foraminal stenosis at multiple levels, moderate on the LEFT at L5-S1. No significant spinal stenosis. CT THORACIC MYELOGRAM FINDINGS: Alignment: 4 mm anterolisthesis T10 on T11, new/increased from prior. Vertebrae: Erosive endplate changes at T70-01, without disc space collapse. T8-L1 fusion posteriorly. Pedicle screws in good position. Paraspinal tissues: Phlegmonous changes from T9 through T12 greater on the RIGHT. Small pleural effusion.Cord: No cord compression at any level. No significant ventral epidural process at T10-11. Cord: No cord compression, cord  enlargement, or significant ventral epidural process at any level. Disc levels: Unchanged severe disc space narrowing and degenerative change at T2-T3. Interval T10-T11 posterior decompression. IMPRESSION: Total myelogram with cervical, thoracic and lumbar postmyelogram CT demonstrates no compressive lesion of the cord at any level. Spinal stimulator device, along with multilevel multisegment fusion hardware appear appropriately placed without adverse features. There is probably some residual infection and paravertebral phlegmon at the T10-11 interspace, but there is no ventral epidural hematoma. Electronically Signed   By: Staci Righter M.D.   On:  05/12/2018 17:38   Ct Thoracic Spine W Contrast  Result Date: 05/11/2018 CLINICAL DATA:  History of discitis-osteomyelitis with epidural abscess at T10-11. Status post T10-11 decompression and debridement with thoracolumbar fusion on 03/28/2018. Worsening lower extremity weakness. EXAM: CT CERVICAL, THORACIC, AND LUMBAR SPINE WITH CONTRAST TECHNIQUE: Multidetector CT imaging of the cervical, lumbar, and thoracic spine was performed with intravenous contrast. Multiplanar CT image reconstructions were also generated. CONTRAST:  123m ISOVUE-300 IOPAMIDOL (ISOVUE-300) INJECTION 61% COMPARISON:  Cervical spine CT 03/22/2018. Thoracic and lumbar spine CT 03/20/2018. FINDINGS: CT CERVICAL SPINE FINDINGS Alignment: Unchanged cervical spine straightening and trace anterolisthesis of C2 on C3. Skull base and vertebrae: No acute fracture. No interval osseous erosion to suggest infectious discitis-osteomyelitis. Solid C3-C6 ACDF. Soft tissues and spinal canal: Unchanged dorsal column stimulator terminating at the C4 level. No gross epidural fluid collection, however artifact from the stimulator and fusion plate and screws limits assessment. No evidence of significant paraspinal or prevertebral inflammatory changes or fluid collection. Disc levels: Unchanged disc and facet degeneration compared to last month's study. Uncovertebral and facet spurring again result in multilevel neural foraminal stenosis, moderate to severe on the left at C2-3 and on the right at C3-4. Upper chest: Partially visualized right PICC. Aortic atherosclerosis. Small right pleural effusion CT THORACIC SPINE FINDINGS Alignment: 4 mm anterolisthesis of T10 on T11, new/increased from prior. Vertebrae: There are new erosive endplate changes at [id number] disc space collapse. Right T10-11 facet joint widening is unchanged with slightly increased erosion of the right T10 inferior facet. Interval T8-L1 posterior fusion has been performed. Pedicle screws  appear well-positioned without evidence of loosening. No acute fracture is identified. Paraspinal and other soft tissues: Paravertebral edema/phlegmonous changes from T9-T12 are greater on the right than on the left and are stable to slightly increased. No organized fluid collection is identified. There is a small right pleural effusion, and there is dependent subsegmental atelectasis in both lungs. A right PICC terminates in the lower SVC. Aortic and coronary artery atherosclerosis is noted. A dorsal column stimulator enters the canal at T3-4. Disc levels: Unchanged severe disc space narrowing and degenerative endplate changes at TM7-8 Interval T10-11 posterior decompression. Limited assessment for an epidural fluid collection due to streak artifact from posterior spinal instrumentation. No gross epidural collection above the fused segment. CT LUMBAR SPINE FINDINGS Segmentation: 5 lumbar type vertebrae. Alignment: Normal. Vertebrae: Prior L1-L3 posterior fusion, extended in the interim into the thoracic spine to the T8 level as described above. Prior interbody fusion from L1-2 to L5-S1. No evidence of screw loosening. Solid ankylosis at each level. No acute fracture. No osseous erosion to suggest active osteomyelitis. Paraspinal and other soft tissues: No significant paraspinal inflammatory changes or fluid collection to suggest infection. Mild aortoiliac atherosclerosis. Disc levels: Unchanged osseous neural foraminal stenosis at multiple levels, moderate on the left at L5-S1 and milder elsewhere as previously described. No gross spinal stenosis. IMPRESSION: 1. New endplate erosion at TM75-44with stable  to slightly increased paravertebral inflammation consistent with known discitis-osteomyelitis. Limited assessment for epidural abscess. 2. Mild interval osseous erosion involving the right T10-11 facet joint which may reflect septic arthritis. 3. No findings to suggest interval development of infection elsewhere in  the cervical, thoracic, or lumbar spine. 4. Interval posterior thoracolumbar fusion as above. Electronically Signed   By: Logan Bores M.D.   On: 05/11/2018 09:18   Ct Lumbar Spine Wo Contrast  Result Date: 05/12/2018 CLINICAL DATA:  Worsening paraplegia.  History of spinal infection. FLUOROSCOPY TIME:  1 minutes and 40 seconds.  Fifteen spot films. PROCEDURE: LUMBAR PUNCTURE FOR CERVICAL LUMBAR AND THORACIC MYELOGRAM CERVICAL AND LUMBAR AND THORACIC MYELOGRAM CT CERVICAL MYELOGRAM CT LUMBAR MYELOGRAM CT THORACIC MYELOGRAM After thorough discussion of risks and benefits of the procedure including bleeding, infection, injury to nerves, blood vessels, adjacent structures as well as headache and CSF leak, written and oral informed consent was obtained. Consent was obtained by Dr. Rolla Flatten. Patient was positioned prone on the fluoroscopy table. Local anesthesia was provided with 1% lidocaine without epinephrine after prepped and draped in the usual sterile fashion. Puncture was performed at L2-L3 using a 3 1/2 inch 20 gauge spinal needle via midline approach. Using a single pass through the dura, the needle was placed within the thecal sac, with return of clear CSF. 10 mL Isovue M-300 was injected into the thecal sac, with normal opacification of the nerve roots and cauda equina consistent with free flow within the subarachnoid space. The patient was then moved to the trendelenburg position and contrast flowed into the Thoracic and Cervical spine regions. I personally performed the lumbar puncture and administered the intrathecal contrast. I also personally supervised acquisition of the myelogram images. TECHNIQUE: Contiguous axial images were obtained through the Cervical, Thoracic, and Lumbar spine after the intrathecal infusion of infusion. Coronal and sagittal reconstructions were obtained of the axial image sets. FINDINGS: TOTAL MYELOGRAM FINDINGS: Good opacification lumbar subarachnoid space. No stenosis or  blockage. Hardware intact. Patient was placed in lateral decubitus and contrast maneuvered into the thoracic region where no block was evident. Patient was further tilted down in contrast maneuvered into the cervical region where no block was present. Dorsal column stimulator leads spanned the cervicothoracic junction. Patient was escorted to CT scanning for further evaluation. CT CERVICAL MYELOGRAM FINDINGS: Alignment: Anatomic except for mild facet mediated slip C2-C3, adjacent segment. Vertebrae: No worrisome osseous lesion.  Solid arthrodesis C3-C6. Cord: Dorsal column stimulator terminating at C4. No cord compression. Paraspinal tissues: Unremarkable. Disc levels: Unchanged disc and facet degeneration compared to last month study. Uncovertebral and facet spurring result in multilevel neural foraminal stenosis moderate to severe on the LEFT at C2-3 and moderate to severe on the RIGHT at C3-4. CT LUMBAR MYELOGRAM FINDINGS: Segmentation: Normal. Alignment:  Normal. Vertebrae: No worrisome osseous lesion.L1-2 S1 fusion appears solid. Conus medullaris: Normal in size and location. Paraspinal tissues: No evidence for hydronephrosis or paravertebral mass. Disc levels: Unchanged osseous neural foraminal stenosis at multiple levels, moderate on the LEFT at L5-S1. No significant spinal stenosis. CT THORACIC MYELOGRAM FINDINGS: Alignment: 4 mm anterolisthesis T10 on T11, new/increased from prior. Vertebrae: Erosive endplate changes at W26-37, without disc space collapse. T8-L1 fusion posteriorly. Pedicle screws in good position. Paraspinal tissues: Phlegmonous changes from T9 through T12 greater on the RIGHT. Small pleural effusion.Cord: No cord compression at any level. No significant ventral epidural process at T10-11. Cord: No cord compression, cord enlargement, or significant ventral epidural process at any level. Disc  levels: Unchanged severe disc space narrowing and degenerative change at T2-T3. Interval T10-T11  posterior decompression. IMPRESSION: Total myelogram with cervical, thoracic and lumbar postmyelogram CT demonstrates no compressive lesion of the cord at any level. Spinal stimulator device, along with multilevel multisegment fusion hardware appear appropriately placed without adverse features. There is probably some residual infection and paravertebral phlegmon at the T10-11 interspace, but there is no ventral epidural hematoma. Electronically Signed   By: Staci Righter M.D.   On: 05/12/2018 17:38   Ct Lumbar Spine W Contrast  Result Date: 05/11/2018 CLINICAL DATA:  History of discitis-osteomyelitis with epidural abscess at T10-11. Status post T10-11 decompression and debridement with thoracolumbar fusion on 03/28/2018. Worsening lower extremity weakness. EXAM: CT CERVICAL, THORACIC, AND LUMBAR SPINE WITH CONTRAST TECHNIQUE: Multidetector CT imaging of the cervical, lumbar, and thoracic spine was performed with intravenous contrast. Multiplanar CT image reconstructions were also generated. CONTRAST:  171m ISOVUE-300 IOPAMIDOL (ISOVUE-300) INJECTION 61% COMPARISON:  Cervical spine CT 03/22/2018. Thoracic and lumbar spine CT 03/20/2018. FINDINGS: CT CERVICAL SPINE FINDINGS Alignment: Unchanged cervical spine straightening and trace anterolisthesis of C2 on C3. Skull base and vertebrae: No acute fracture. No interval osseous erosion to suggest infectious discitis-osteomyelitis. Solid C3-C6 ACDF. Soft tissues and spinal canal: Unchanged dorsal column stimulator terminating at the C4 level. No gross epidural fluid collection, however artifact from the stimulator and fusion plate and screws limits assessment. No evidence of significant paraspinal or prevertebral inflammatory changes or fluid collection. Disc levels: Unchanged disc and facet degeneration compared to last month's study. Uncovertebral and facet spurring again result in multilevel neural foraminal stenosis, moderate to severe on the left at C2-3 and on  the right at C3-4. Upper chest: Partially visualized right PICC. Aortic atherosclerosis. Small right pleural effusion CT THORACIC SPINE FINDINGS Alignment: 4 mm anterolisthesis of T10 on T11, new/increased from prior. Vertebrae: There are new erosive endplate changes at [id number] disc space collapse. Right T10-11 facet joint widening is unchanged with slightly increased erosion of the right T10 inferior facet. Interval T8-L1 posterior fusion has been performed. Pedicle screws appear well-positioned without evidence of loosening. No acute fracture is identified. Paraspinal and other soft tissues: Paravertebral edema/phlegmonous changes from T9-T12 are greater on the right than on the left and are stable to slightly increased. No organized fluid collection is identified. There is a small right pleural effusion, and there is dependent subsegmental atelectasis in both lungs. A right PICC terminates in the lower SVC. Aortic and coronary artery atherosclerosis is noted. A dorsal column stimulator enters the canal at T3-4. Disc levels: Unchanged severe disc space narrowing and degenerative endplate changes at TU9-8 Interval T10-11 posterior decompression. Limited assessment for an epidural fluid collection due to streak artifact from posterior spinal instrumentation. No gross epidural collection above the fused segment. CT LUMBAR SPINE FINDINGS Segmentation: 5 lumbar type vertebrae. Alignment: Normal. Vertebrae: Prior L1-L3 posterior fusion, extended in the interim into the thoracic spine to the T8 level as described above. Prior interbody fusion from L1-2 to L5-S1. No evidence of screw loosening. Solid ankylosis at each level. No acute fracture. No osseous erosion to suggest active osteomyelitis. Paraspinal and other soft tissues: No significant paraspinal inflammatory changes or fluid collection to suggest infection. Mild aortoiliac atherosclerosis. Disc levels: Unchanged osseous neural foraminal stenosis at  multiple levels, moderate on the left at L5-S1 and milder elsewhere as previously described. No gross spinal stenosis. IMPRESSION: 1. New endplate erosion at TJ19-14with stable to slightly increased paravertebral inflammation consistent with known discitis-osteomyelitis.  Limited assessment for epidural abscess. 2. Mild interval osseous erosion involving the right T10-11 facet joint which may reflect septic arthritis. 3. No findings to suggest interval development of infection elsewhere in the cervical, thoracic, or lumbar spine. 4. Interval posterior thoracolumbar fusion as above. Electronically Signed   By: Logan Bores M.D.   On: 05/11/2018 09:18   Dg Chest Portable 1 View  Result Date: 05/08/2018 CLINICAL DATA:  Verify PICC line placement. EXAM: PORTABLE CHEST 1 VIEW COMPARISON:  Apr 25, 2018 FINDINGS: A right PICC line is identified. The distal tip is difficult to visualize but appears to terminate in the central SVC. No change in the cardiomediastinal silhouette. No pneumothorax. No other acute interval changes. IMPRESSION: The right PICC line appears to be in good position. The distal tip is difficult to visualize with confidence but appears to be in the central SVC. Electronically Signed   By: Dorise Bullion III M.D   On: 05/08/2018 16:03   Dg Myelography Lumbar Inj Thoracic  Result Date: 05/12/2018 CLINICAL DATA:  Worsening paraplegia.  History of spinal infection. FLUOROSCOPY TIME:  1 minutes and 40 seconds.  Fifteen spot films. PROCEDURE: LUMBAR PUNCTURE FOR CERVICAL LUMBAR AND THORACIC MYELOGRAM CERVICAL AND LUMBAR AND THORACIC MYELOGRAM CT CERVICAL MYELOGRAM CT LUMBAR MYELOGRAM CT THORACIC MYELOGRAM After thorough discussion of risks and benefits of the procedure including bleeding, infection, injury to nerves, blood vessels, adjacent structures as well as headache and CSF leak, written and oral informed consent was obtained. Consent was obtained by Dr. Rolla Flatten. Patient was positioned prone  on the fluoroscopy table. Local anesthesia was provided with 1% lidocaine without epinephrine after prepped and draped in the usual sterile fashion. Puncture was performed at L2-L3 using a 3 1/2 inch 20 gauge spinal needle via midline approach. Using a single pass through the dura, the needle was placed within the thecal sac, with return of clear CSF. 10 mL Isovue M-300 was injected into the thecal sac, with normal opacification of the nerve roots and cauda equina consistent with free flow within the subarachnoid space. The patient was then moved to the trendelenburg position and contrast flowed into the Thoracic and Cervical spine regions. I personally performed the lumbar puncture and administered the intrathecal contrast. I also personally supervised acquisition of the myelogram images. TECHNIQUE: Contiguous axial images were obtained through the Cervical, Thoracic, and Lumbar spine after the intrathecal infusion of infusion. Coronal and sagittal reconstructions were obtained of the axial image sets. FINDINGS: TOTAL MYELOGRAM FINDINGS: Good opacification lumbar subarachnoid space. No stenosis or blockage. Hardware intact. Patient was placed in lateral decubitus and contrast maneuvered into the thoracic region where no block was evident. Patient was further tilted down in contrast maneuvered into the cervical region where no block was present. Dorsal column stimulator leads spanned the cervicothoracic junction. Patient was escorted to CT scanning for further evaluation. CT CERVICAL MYELOGRAM FINDINGS: Alignment: Anatomic except for mild facet mediated slip C2-C3, adjacent segment. Vertebrae: No worrisome osseous lesion.  Solid arthrodesis C3-C6. Cord: Dorsal column stimulator terminating at C4. No cord compression. Paraspinal tissues: Unremarkable. Disc levels: Unchanged disc and facet degeneration compared to last month study. Uncovertebral and facet spurring result in multilevel neural foraminal stenosis  moderate to severe on the LEFT at C2-3 and moderate to severe on the RIGHT at C3-4. CT LUMBAR MYELOGRAM FINDINGS: Segmentation: Normal. Alignment:  Normal. Vertebrae: No worrisome osseous lesion.L1-2 S1 fusion appears solid. Conus medullaris: Normal in size and location. Paraspinal tissues: No evidence for hydronephrosis or  paravertebral mass. Disc levels: Unchanged osseous neural foraminal stenosis at multiple levels, moderate on the LEFT at L5-S1. No significant spinal stenosis. CT THORACIC MYELOGRAM FINDINGS: Alignment: 4 mm anterolisthesis T10 on T11, new/increased from prior. Vertebrae: Erosive endplate changes at O71-21, without disc space collapse. T8-L1 fusion posteriorly. Pedicle screws in good position. Paraspinal tissues: Phlegmonous changes from T9 through T12 greater on the RIGHT. Small pleural effusion.Cord: No cord compression at any level. No significant ventral epidural process at T10-11. Cord: No cord compression, cord enlargement, or significant ventral epidural process at any level. Disc levels: Unchanged severe disc space narrowing and degenerative change at T2-T3. Interval T10-T11 posterior decompression. IMPRESSION: Total myelogram with cervical, thoracic and lumbar postmyelogram CT demonstrates no compressive lesion of the cord at any level. Spinal stimulator device, along with multilevel multisegment fusion hardware appear appropriately placed without adverse features. There is probably some residual infection and paravertebral phlegmon at the T10-11 interspace, but there is no ventral epidural hematoma. Electronically Signed   By: Staci Righter M.D.   On: 05/12/2018 17:38   Dg Hip Unilat With Pelvis 2-3 Views Right  Result Date: 05/08/2018 CLINICAL DATA:  Fall, right hip pain EXAM: DG HIP (WITH OR WITHOUT PELVIS) 2-3V RIGHT COMPARISON:  None. FINDINGS: No fracture or dislocation is seen. Bilateral hip joint spaces are preserved. Visualized bony pelvis appears intact. Degenerative  changes at L5-S1. IMPRESSION: Negative. Electronically Signed   By: Julian Hy M.D.   On: 05/08/2018 16:53   [place] Ekg Site Rite  Result Date: 04/25/2018 If Site Rite image not attached, placement could not be confirmed due to current cardiac rhythm.   Lab Data:  CBC: Recent Labs  Lab 05/12/18 0522 05/13/18 0236 05/14/18 0408 05/15/18 0411 05/16/18 0509  WBC 5.7 7.4 7.8 7.3 8.8  HGB 9.1* 8.9* 9.2* 9.1* 9.5*  HCT 31.8* 31.7* 32.5* 32.2* 33.4*  MCV 83.9 83.4 82.1 81.9 83.1  PLT 589* 552* 617* 578* 975*   Basic Metabolic Panel: Recent Labs  Lab 05/10/18 0933 05/11/18 0659 05/12/18 0522 05/13/18 0236 05/16/18 0509  NA 135  --  137 138 137  K 3.9  --  3.8 3.7 4.1  CL 99*  --  102 105 102  CO2 29  --  '27 27 27  ' GLUCOSE 109*  --  77 126* 89  BUN 9  --  '12 6 7  ' CREATININE 0.74  --  0.64 0.77 0.81  CALCIUM 8.9  --  8.7* 8.7* 8.8*  MG 1.7 2.0  --   --   --    GFR: Estimated Creatinine Clearance: 119.4 mL/min (by C-G formula based on SCr of 0.81 mg/dL). Liver Function Tests: No results for input(s): AST, ALT, ALKPHOS, BILITOT, PROT, ALBUMIN in the last 168 hours. No results for input(s): LIPASE, AMYLASE in the last 168 hours. No results for input(s): AMMONIA in the last 168 hours. Coagulation Profile: Recent Labs  Lab 05/12/18 0522 05/13/18 0236 05/14/18 0408 05/15/18 0411 05/16/18 0509  INR 1.30 1.18 1.19 1.25 1.24   Cardiac Enzymes: Recent Labs  Lab 05/11/18 0659  CKTOTAL 15*   BNP (last 3 results) No results for input(s): PROBNP in the last 8760 hours. HbA1C: No results for input(s): HGBA1C in the last 72 hours. CBG: No results for input(s): GLUCAP in the last 168 hours. Lipid Profile: No results for input(s): CHOL, HDL, LDLCALC, TRIG, CHOLHDL, LDLDIRECT in the last 72 hours. Thyroid Function Tests: No results for input(s): TSH, T4TOTAL, FREET4, T3FREE, THYROIDAB in the last  72 hours. Anemia Panel: No results for input(s): VITAMINB12, FOLATE,  FERRITIN, TIBC, IRON, RETICCTPCT in the last 72 hours. Urine analysis:    Component Value Date/Time   COLORURINE AMBER (A) 04/05/2018 1621   APPEARANCEUR CLEAR 04/05/2018 1621   LABSPEC 1.029 04/05/2018 1621   PHURINE 5.0 04/05/2018 1621   GLUCOSEU NEGATIVE 04/05/2018 1621   HGBUR NEGATIVE 04/05/2018 1621   BILIRUBINUR MODERATE (A) 04/05/2018 1621   KETONESUR NEGATIVE 04/05/2018 1621   PROTEINUR 30 (A) 04/05/2018 1621   UROBILINOGEN 0.2 03/05/2011 1909   NITRITE NEGATIVE 04/05/2018 1621   LEUKOCYTESUR NEGATIVE 04/05/2018 1621     Sahan Pen M.D. [facility] 05/16/2018, 11:30 AM  Pager: [phone] Between 7am to 7pm - call Pager - [phone]  After 7pm go to [email or web address] - password [email or web address]  Call night coverage person covering after 7pm

## 2018-05-16 NOTE — Progress Notes (Signed)
Pt has home CPAP and places himself on and off when ready. RT will continue to monitor.

## 2018-05-16 NOTE — Progress Notes (Signed)
Occupational Therapy Treatment Patient Details Name: Cody Velez MRN: [id number] DOB: 05-10-1946 Today's Date: 05/16/2018    History of present illness Cody Velez is a 72 y.o. male with medical history significant of obstructive sleep apnea, hypertension, obesity, chronic back pain with multiple 3 previous surgeries who recently had an epidural abscess causing paraplegia surgically decompressed on March 28, 2018 has been on 8 weeks duration of cefazolin and rifampin until May 22, 2018.  Patient has been in short-term rehab and was just discharged this past Saturday.  He went home and since being home he is fallen several times.  Family brought him back in due to his frequent falls and the need for further rehab versus skilled nursing facility placement. Patient is being admitted again further evaluation for more rehabilitation. Per Neurology - consistent with a slowly recovering myelopathy patient.   OT comments  Pt making progress with functional goals. Pt continues to require extensive assist with LB ADLs and cues for back precautions during bed mobility. Pt in w/c with family to propel outside of his room at end of session. OT will continue to follow acutely  Follow Up Recommendations  CIR;Supervision/Assistance - 24 hour    Equipment Recommendations  Hospital bed    Recommendations for Other Services      Precautions / Restrictions Precautions Precautions: Fall;Back Precaution Booklet Issued: No Precaution Comments: Pt able to recall 2/3 back precautions with minimal cues.  Required Braces or Orthoses: Spinal Brace Knee Immobilizer - Right: On when out of bed or walking Spinal Brace: Thoracolumbosacral orthotic Restrictions Weight Bearing Restrictions: No       Mobility Bed Mobility Overal bed mobility: Needs Assistance Bed Mobility: Rolling;Sidelying to Sit Rolling: Mod assist Sidelying to sit: Min assist Supine to sit: Min assist     General bed mobility  comments: Assist to roll with cues for log roll technique. max cues to avoid twisiting as pt was doing so during sup - sit transition  Transfers Overall transfer level: Needs assistance   Transfers: Lateral/Scoot Transfers          Lateral/Scoot Transfers: Min assist General transfer comment: attempted sit -stand in prep for SPT to w/c, however pt unable. pt able to scoot from EOB into w/c with min A    Balance Overall balance assessment: Needs assistance Sitting-balance support: Feet supported Sitting balance-Leahy Scale: Fair Sitting balance - Comments: fair/good                                   ADL either performed or assessed with clinical judgement   ADL                   Upper Body Dressing : Maximal assistance;Sitting Upper Body Dressing Details (indicate cue type and reason): doning back brace       Toilet Transfer Details (indicate cue type and reason): simulated with lateral sccot be d- w/c           General ADL Comments: Pt uable to stand with + 2 assist, was able to scoot over to w/c from sitting EOB     Vision Baseline Vision/History: Wears glasses Wears Glasses: Reading only Patient Visual Report: No change from baseline     Perception     Praxis      Cognition Arousal/Alertness: Awake/alert Behavior During Therapy: WFL for tasks assessed/performed Overall Cognitive Status: Within Functional Limits for tasks assessed  Exercises     Shoulder Instructions       General Comments      Pertinent Vitals/ Pain       Pain Assessment: Faces Faces Pain Scale: Hurts even more Pain Location: Rt knee spasms; headache Pain Descriptors / Indicators: Shooting;Spasm;Headache Pain Intervention(s): Limited activity within patient's tolerance;Monitored during session;Repositioned  Home Living                                          Prior  Functioning/Environment              Frequency  Min 2X/week        Progress Toward Goals  OT Goals(current goals can now be found in the care plan section)  Progress towards OT goals: Progressing toward goals     Plan Discharge plan remains appropriate    Co-evaluation                 AM-PAC PT "6 Clicks" Daily Activity     Outcome Measure   Help from another person eating meals?: None Help from another person taking care of personal grooming?: A Little Help from another person toileting, which includes using toliet, bedpan, or urinal?: A Lot Help from another person bathing (including washing, rinsing, drying)?: A Lot Help from another person to put on and taking off regular upper body clothing?: A Little Help from another person to put on and taking off regular lower body clothing?: A Lot 6 Click Score: 16    End of Session Equipment Utilized During Treatment: Back brace;Gait belt;Other (comment)(w/c)  OT Visit Diagnosis: Muscle weakness (generalized) (M62.81);Other abnormalities of gait and mobility (R26.89)   Activity Tolerance Patient tolerated treatment well   Patient Left with family/visitor present   Nurse Communication      Functional Assessment Tool Used: AM-PAC 6 Clicks Daily Activity   Time: 7353-2992 OT Time Calculation (min): 25 min  Charges: OT G-codes **NOT FOR INPATIENT CLASS** Functional Assessment Tool Used: AM-PAC 6 Clicks Daily Activity OT General Charges $OT Visit: 1 Visit OT Treatments $Therapeutic Activity: 8-22 mins     Britt Bottom 05/16/2018, 2:47 PM

## 2018-05-16 NOTE — Progress Notes (Signed)
ANTICOAGULATION CONSULT NOTE - Follow Up Consult  Pharmacy Consult for Heparin/Coumadin Indication: DVT  No Known Allergies  Patient Measurements: Height: 6\' 3"  (190.5 cm) Weight: 276 lb 14.4 oz (125.6 kg) IBW/kg (Calculated) : 84.5 Heparin Dosing Weight:    Vital Signs: Temp: 98.6 F (37 C) (05/28 0805) Temp Source: Oral (05/28 0805) BP: 132/78 (05/28 0805) Pulse Rate: 75 (05/28 0805)  Labs: Recent Labs    05/14/18 0408 05/15/18 0411 05/16/18 0509 05/16/18 0511  HGB 9.2* 9.1* 9.5*  --   HCT 32.5* 32.2* 33.4*  --   PLT 617* 578* 587*  --   LABPROT 15.0 15.6* 15.5*  --   INR 1.19 1.25 1.24  --   HEPARINUNFRC 0.38 0.36  --  0.37  CREATININE  --   --  0.81  --     Estimated Creatinine Clearance: 119.4 mL/min (by C-G formula based on SCr of 0.81 mg/dL).  Assessment: 46 YOM with known epidural abscess on Cefazolin + Rifampin who presented on 5/20 with frequent falls. The patient was on Warfarin for anticoagulation PTA for hx DVT. Doses were held and reversed for myelogram  - resumed post-op with Heparin bridge. Pharmacy consulted to dose.   Heparin level this morning is therapeutic (HL 0.37 << 0.36, goal of 0.3-0.7), INR remains SUBtherapeutic (INR 1.24 << 1.25). CBC stable - no bleeding noted at this time.  Noted DDI with Rifampin and Warfarin - will monitor.   Goal of Therapy:  Heparin level 0.3-0.7 units/ml  INR 2-3 Monitor platelets by anticoagulation protocol: Yes   Plan:  - Continue Heparin at 2400 units/hr (24 ml/hr) - Warfarin 10 mg x 1 dose at 1800 today - Will continue to monitor for any signs/symptoms of bleeding and will follow up with heparin level and PT/INR in the a.m.   Thank you for allowing pharmacy to be a part of this patient's care.  Alycia Rossetti, PharmD, BCPS Clinical Pharmacist Pager: [phone] Clinical phone for 05/16/2018 from 7a-3:30p: [phone] If after 3:30p, please call main pharmacy at: [phone] 05/16/2018 9:40 AM

## 2018-05-16 NOTE — Progress Notes (Signed)
Physical Therapy Treatment Patient Details Name: Cody Velez MRN: [id number] DOB: [birth year] Today's Date: 05/16/2018    History of Present Illness Cody Velez is a 72 y.o. male with medical history significant of obstructive sleep apnea, hypertension, obesity, chronic back pain with multiple 3 previous surgeries who recently had an epidural abscess causing paraplegia surgically decompressed on March 28, 2018 has been on 8 weeks duration of cefazolin and rifampin until May 22, 2018.  Patient has been in short-term rehab and was just discharged this past Saturday.  He went home and since being home he is fallen several times.  Family brought him back in due to his frequent falls and the need for further rehab versus skilled nursing facility placement. Patient is being admitted again further evaluation for more rehabilitation. Per Neurology - consistent with a slowly recovering myelopathy patient.    PT Comments    Patient seen today for mobility progression. Attempted sit to stand with +2 assist however pt unable to achieve standing. Pt able to lateral scoot from elevated bed to w/c with min A. Limited by c/o R knee pain and bilat LE spasms. Continue to progress as tolerated with anticipated d/c to SNF for further skilled PT services.     Follow Up Recommendations  SNF;Supervision for mobility/OOB     Equipment Recommendations  None recommended by PT    Recommendations for Other Services       Precautions / Restrictions Precautions Precautions: Fall;Back Precaution Booklet Issued: No Precaution Comments: Pt able to recall 2/3 back precautions with minimal cues.  Required Braces or Orthoses: Spinal Brace Knee Immobilizer - Right: On when out of bed or walking Spinal Brace: Thoracolumbosacral orthotic Restrictions Weight Bearing Restrictions: No    Mobility  Bed Mobility Overal bed mobility: Needs Assistance Bed Mobility: Rolling;Sidelying to Sit Rolling: Mod  assist Sidelying to sit: Min assist Supine to sit: Min assist     General bed mobility comments: cues for sequencing to maintain back precautions   Transfers Overall transfer level: Needs assistance   Transfers: Lateral/Scoot Transfers;Sit to/from Stand Sit to Stand: Max assist;+2 physical assistance;From elevated surface        Lateral/Scoot Transfers: Min assist General transfer comment: attempted sit to stand with +2 assist however pt unable to lift buttocks from bed; knees blocked and cues for technique  Ambulation/Gait                 Stairs             Wheelchair Mobility    Modified Rankin (Stroke Patients Only)       Balance Overall balance assessment: Needs assistance Sitting-balance support: Feet supported Sitting balance-Leahy Scale: Fair Sitting balance - Comments: fair/good                                    Cognition Arousal/Alertness: Awake/alert Behavior During Therapy: WFL for tasks assessed/performed Overall Cognitive Status: Within Functional Limits for tasks assessed                                        Exercises      General Comments        Pertinent Vitals/Pain Pain Assessment: Faces Faces Pain Scale: Hurts even more Pain Location: Rt knee spasms; headache Pain Descriptors / Indicators: Shooting;Spasm;Headache Pain Intervention(s): Monitored during  session;Limited activity within patient's tolerance;Repositioned    Home Living                      Prior Function            PT Goals (current goals can now be found in the care plan section) Progress towards PT goals: Progressing toward goals    Frequency    Min 2X/week      PT Plan Current plan remains appropriate    Co-evaluation PT/OT/SLP Co-Evaluation/Treatment: Yes Reason for Co-Treatment: For patient/therapist safety;To address functional/ADL transfers PT goals addressed during session: Mobility/safety with  mobility        AM-PAC PT "6 Clicks" Daily Activity  Outcome Measure  Difficulty turning over in bed (including adjusting bedclothes, sheets and blankets)?: Unable Difficulty moving from lying on back to sitting on the side of the bed? : Unable Difficulty sitting down on and standing up from a chair with arms (e.g., wheelchair, bedside commode, etc,.)?: Unable Help needed moving to and from a bed to chair (including a wheelchair)?: A Lot Help needed walking in hospital room?: Total Help needed climbing 3-5 steps with a railing? : Total 6 Click Score: 7    End of Session Equipment Utilized During Treatment: Gait belt Activity Tolerance: Patient tolerated treatment well Patient left: in chair;with call bell/phone within reach;with family/visitor present Nurse Communication: Mobility status PT Visit Diagnosis: Unsteadiness on feet (R26.81);Other abnormalities of gait and mobility (R26.89);Muscle weakness (generalized) (M62.81)     Time: 4765-4650 PT Time Calculation (min) (ACUTE ONLY): 25 min  Charges:  $Therapeutic Activity: 8-22 mins                    G Codes:       Earney Navy, PTA Pager: [phone]     Darliss Cheney 05/16/2018, 4:18 PM

## 2018-05-16 NOTE — NC FL2 (Signed)
[place] LEVEL OF CARE SCREENING TOOL     IDENTIFICATION  Patient Name: Cody Velez Birthdate: 04-22-1946 Sex: male Admission Date (Current Location): 05/08/2018  [facility] and [place] Number:  [employer] and Address:  [facility] [place] [address] [place], [place] [address]      Provider Number: [id number]  Attending Physician Name and Address:  Mendel Corning, MD  Relative Name and Phone Number:       Current Level of Care: Hospital Recommended Level of Care: [place] Prior Approval Number:    Date Approved/Denied:   PASRR Number: [id number]  Discharge Plan: SNF    Current Diagnoses: Patient Active Problem List   Diagnosis Date Noted  . Falls frequently 05/08/2018  . Fall   . Sleep disturbance   . Post-op pain   . Hypokalemia   . Morbid obesity ([place])   . Postoperative pain   . DVT, lower extremity, distal, acute, bilateral ([place])   . Acute blood loss anemia   . Anemia of chronic disease   . Essential hypertension   . Bacteremia   . Myelopathy ([place]) 03/30/2018  . Paraplegia ([place])   . Postlaminectomy syndrome, lumbar region   . Epidural abscess   . Abdominal distension   . Encephalopathy   . Weakness of both lower extremities   . Discitis of thoracic region   . Spinal cord stimulator status   . Staphylococcus aureus sepsis ([place]) 03/20/2018  . Bacteremia due to methicillin susceptible Staphylococcus aureus (MSSA) 03/18/2018  . Obesity 03/18/2018  . Weakness 03/16/2018  . Fever 03/16/2018  . Sepsis ([place]) 03/16/2018  . Nephrolithiasis 03/16/2018  . AKI (acute kidney injury) ([place]) 03/16/2018  . Acute metabolic encephalopathy 58/08/9832  . Cognitive impairment 03/16/2018  . Chronic pain syndrome 03/16/2018  . B12 deficiency 01/28/2016  . Memory difficulty 07/23/2015  . History of cerebrovascular disease 07/23/2015  . FH: colon cancer 08/13/2014  . Hx of adenomatous colonic polyps  08/13/2014  . Chronic back pain 05/30/2012  . CAD, NATIVE VESSEL 07/17/2010  . HYPERCHOLESTEROLEMIA 10/31/2009  . SMOKELESS TOBACCO ABUSE 10/31/2009  . Obstructive sleep apnea 10/31/2009  . Essential hypertension, benign 10/31/2009  . GERD 10/31/2009    Orientation RESPIRATION BLADDER Height & Weight     Self, Time, Situation, Place  Normal Continent Weight: 276 lb 14.4 oz (125.6 kg) Height:  6\' 3"  (190.5 cm)  BEHAVIORAL SYMPTOMS/MOOD NEUROLOGICAL BOWEL NUTRITION STATUS      Continent Diet(heart healthy)  AMBULATORY STATUS COMMUNICATION OF NEEDS Skin   Limited Assist Verbally Normal                       Personal Care Assistance Level of Assistance  Bathing, Feeding, Dressing Bathing Assistance: Limited assistance Feeding assistance: Independent Dressing Assistance: Limited assistance     Functional Limitations Info  Sight, Hearing, Speech Sight Info: Adequate Hearing Info: Adequate Speech Info: Adequate    SPECIAL CARE FACTORS FREQUENCY  PT (By licensed PT), OT (By licensed OT)     PT Frequency: 5x/wk OT Frequency: 5x/wk            Contractures Contractures Info: Not present    Additional Factors Info  Code Status, Allergies, Psychotropic Code Status Info: Full Allergies Info: NKA Psychotropic Info: Effexor-XR 150mg  2x/day         Current Medications (05/16/2018):  This is the current hospital active medication list Current Facility-Administered Medications  Medication Dose Route Frequency  Provider Last Rate Last Dose  . 0.9 %  sodium chloride infusion  250 mL Intravenous PRN Orson Eva, MD 10 mL/hr at 05/15/18 1900    . amLODipine (NORVASC) tablet 10 mg  10 mg Oral Daily Tat, David, MD   10 mg at 05/16/18 0800  . aspirin chewable tablet 81 mg  81 mg Oral Daily Tat, David, MD   81 mg at 05/16/18 0800  . baclofen (LIORESAL) tablet 5 mg  5 mg Oral TID Rai, Ripudeep K, MD   5 mg at 05/16/18 1116  . ceFAZolin (ANCEF) IVPB 2g/100 mL premix  2 g  Intravenous Franco Collet, MD 200 mL/hr at 05/16/18 1116 2 g at 05/16/18 1116  . docusate sodium (COLACE) capsule 200 mg  200 mg Oral BID Orson Eva, MD   200 mg at 05/16/18 0758  . donepezil (ARICEPT) tablet 10 mg  10 mg Oral Benay Pike, MD   10 mg at 05/15/18 2215  . finasteride (PROSCAR) tablet 5 mg  5 mg Oral Daily Tat, David, MD   5 mg at 05/16/18 0759  . fluticasone (FLONASE) 50 MCG/ACT nasal spray 1 spray  1 spray Each Nare Daily PRN Tat, David, MD      . gabapentin (NEURONTIN) capsule 400 mg  400 mg Oral TID Orson Eva, MD   400 mg at 05/16/18 0758  . heparin 100-0.45 UNIT/ML-% infusion        Stopped at 05/16/18 0802  . heparin ADULT infusion 100 units/mL (25000 units/29mL sodium chloride 0.45%)  2,400 Units/hr Intravenous Continuous Joselyn Glassman A, RPH 24 mL/hr at 05/16/18 0755 2,400 Units/hr at 05/16/18 0755  . latanoprost (XALATAN) 0.005 % ophthalmic solution 1 drop  1 drop Both Eyes QHS Orson Eva, MD   1 drop at 05/15/18 2220  . lidocaine (PF) (XYLOCAINE) 1 % injection 5 mL  5 mL Other Once Kristeen Miss, MD      . linaclotide Rolan Lipa) capsule 290 mcg  290 mcg Oral QAC breakfast Tat, Shanon Brow, MD   290 mcg at 05/16/18 0800  . lisinopril (PRINIVIL,ZESTRIL) tablet 5 mg  5 mg Oral Daily Tat, David, MD   5 mg at 05/16/18 0758  . Melatonin TABS 1.5 mg  1.5 mg Oral Benay Pike, MD   1.5 mg at 05/15/18 2217  . memantine (NAMENDA) tablet 10 mg  10 mg Oral BID Orson Eva, MD   10 mg at 05/16/18 1117  . oxyCODONE (Oxy IR/ROXICODONE) immediate release tablet 10 mg  10 mg Oral Q4H PRN Orson Eva, MD   10 mg at 05/16/18 0612  . pantoprazole (PROTONIX) EC tablet 40 mg  40 mg Oral Daily Tat, David, MD   40 mg at 05/16/18 0757  . polyethylene glycol (MIRALAX / GLYCOLAX) packet 17 g  17 g Oral Daily Rai, Ripudeep K, MD   17 g at 05/14/18 0858  . potassium chloride SA (K-DUR,KLOR-CON) CR tablet 20 mEq  20 mEq Oral Daily Tat, David, MD   20 mEq at 05/16/18 0759  . pravastatin (PRAVACHOL) tablet  10 mg  10 mg Oral QPM Orson Eva, MD   10 mg at 05/15/18 1817  . rifampin (RIFADIN) capsule 300 mg  300 mg Oral Therisa Doyne, MD   300 mg at 05/16/18 1117  . sodium chloride flush (NS) 0.9 % injection 3 mL  3 mL Intravenous Therisa Doyne, MD   3 mL at 05/16/18 0807  . sodium chloride flush (NS) 0.9 % injection 3  mL  3 mL Intravenous PRN Orson Eva, MD   3 mL at 05/11/18 1859  . traZODone (DESYREL) tablet 100 mg  100 mg Oral Benay Pike, MD   100 mg at 05/15/18 2226  . venlafaxine XR (EFFEXOR-XR) 24 hr capsule 150 mg  150 mg Oral BID Tat, David, MD   150 mg at 05/16/18 0800  . vitamin B-12 (CYANOCOBALAMIN) tablet 1,000 mcg  1,000 mcg Oral Daily Tat, David, MD   1,000 mcg at 05/16/18 0757  . warfarin (COUMADIN) tablet 10 mg  10 mg Oral ONCE-1800 Rolla Flatten, Philip      . Warfarin - Pharmacist Dosing Inpatient   Does not apply q1800 Tat, Shanon Brow, MD         Discharge Medications: Please see discharge summary for a list of discharge medications.  Relevant Imaging Results:  Relevant Lab Results:   Additional Information SS#: [id number]  Geralynn Ochs, LCSW

## 2018-05-16 NOTE — Care Management Note (Signed)
Case Management Note  Patient Details  Name: Cody Velez MRN: [id number] Date of Birth: [birth year]  Subjective/Objective:    Pt admitted with frequent falls. He is from home with his girlfriend. Pt was recently d/ced from [facility].                Action/Plan: PT recommending SNF, OT recommending IR. CIR has denied patient but he and his girlfriend wish to pursue other IR facilities near [place]. CM met with them and they are interested in [name] inpt rehab, [name] or [name].  CM left message for [name] at [facility] and faxed patients information. CM called and spoke to [name] with [facility] HR and faxed him the information requested. He plans to look over information and try and see pt later today. CM left message with [name].  Patient and girlfriend are requesting [name] SNF if patient is unable to get into inpatient rehab and CSW is aware.  CM following.   Expected Discharge Date:  05/10/18               Expected Discharge Plan:  IP Rehab Facility  In-House Referral:  Clinical Social Work  Discharge planning Services  CM Consult  Post Acute Care Choice:    Choice offered to:     DME Arranged:    DME Agency:     HH Arranged:    HH Agency:     Status of Service:  In process, will continue to follow  If discussed at Long Length of Stay Meetings, dates discussed:    Additional Comments:  Pollie Friar, RN 05/16/2018, 10:49 AM

## 2018-05-16 NOTE — Progress Notes (Signed)
Patient awake, as is significant other. Pain 8/10. Medication given. Patient stated he had a good night. Good sense of humor. Will continue to monitor.

## 2018-05-17 ENCOUNTER — Inpatient Hospital Stay (HOSPITAL_COMMUNITY): Payer: Medicare Other

## 2018-05-17 ENCOUNTER — Encounter: Payer: Federal, State, Local not specified - PPO | Admitting: Physical Medicine & Rehabilitation

## 2018-05-17 DIAGNOSIS — M25562 Pain in left knee: Secondary | ICD-10-CM

## 2018-05-17 DIAGNOSIS — M25561 Pain in right knee: Secondary | ICD-10-CM

## 2018-05-17 LAB — SYNOVIAL CELL COUNT + DIFF, W/ CRYSTALS
Eosinophils-Synovial: 0 % (ref 0–1)
Eosinophils-Synovial: 0 % (ref 0–1)
Lymphocytes-Synovial Fld: 0 % (ref 0–20)
Lymphocytes-Synovial Fld: 6 % (ref 0–20)
Monocyte-Macrophage-Synovial Fluid: 25 % — ABNORMAL LOW (ref 50–90)
Monocyte-Macrophage-Synovial Fluid: 5 % — ABNORMAL LOW (ref 50–90)
Neutrophil, Synovial: 69 % — ABNORMAL HIGH (ref 0–25)
Neutrophil, Synovial: 95 % — ABNORMAL HIGH (ref 0–25)
Other Cells-SYN: 0
Other Cells-SYN: 0
WBC, Synovial: 68500 /mm3 — ABNORMAL HIGH (ref 0–200)
WBC, Synovial: 9700 /mm3 — ABNORMAL HIGH (ref 0–200)

## 2018-05-17 LAB — CBC
HCT: 32.9 % — ABNORMAL LOW (ref 39.0–52.0)
Hemoglobin: 9.4 g/dL — ABNORMAL LOW (ref 13.0–17.0)
MCH: 23.3 pg — ABNORMAL LOW (ref 26.0–34.0)
MCHC: 28.6 g/dL — ABNORMAL LOW (ref 30.0–36.0)
MCV: 81.4 fL (ref 78.0–100.0)
Platelets: 625 10*3/uL — ABNORMAL HIGH (ref 150–400)
RBC: 4.04 MIL/uL — ABNORMAL LOW (ref 4.22–5.81)
RDW: 17 % — ABNORMAL HIGH (ref 11.5–15.5)
WBC: 8.7 10*3/uL (ref 4.0–10.5)

## 2018-05-17 LAB — PROTIME-INR
INR: 1.58
Prothrombin Time: 18.8 seconds — ABNORMAL HIGH (ref 11.4–15.2)

## 2018-05-17 LAB — HEPARIN LEVEL (UNFRACTIONATED): Heparin Unfractionated: 0.41 IU/mL (ref 0.30–0.70)

## 2018-05-17 MED ORDER — BUPIVACAINE HCL (PF) 0.5 % IJ SOLN
20.0000 mL | Freq: Once | INTRAMUSCULAR | Status: AC
  Administered 2018-05-17: 20 mL
  Filled 2018-05-17 (×3): qty 20

## 2018-05-17 MED ORDER — WARFARIN SODIUM 5 MG PO TABS
10.0000 mg | ORAL_TABLET | Freq: Once | ORAL | Status: AC
  Administered 2018-05-17: 10 mg via ORAL
  Filled 2018-05-17: qty 2

## 2018-05-17 MED ORDER — DIAZEPAM 5 MG PO TABS
5.0000 mg | ORAL_TABLET | Freq: Four times a day (QID) | ORAL | Status: DC | PRN
  Administered 2018-05-17 – 2018-05-21 (×7): 5 mg via ORAL
  Filled 2018-05-17 (×7): qty 1

## 2018-05-17 MED ORDER — METHYLPREDNISOLONE ACETATE 40 MG/ML IJ SUSP
80.0000 mg | Freq: Once | INTRAMUSCULAR | Status: AC
  Administered 2018-05-17: 80 mg via INTRA_ARTICULAR
  Filled 2018-05-17: qty 2

## 2018-05-17 MED ORDER — HYDROMORPHONE HCL 1 MG/ML IJ SOLN
0.5000 mg | Freq: Once | INTRAMUSCULAR | Status: AC
  Administered 2018-05-17: 0.5 mg via INTRAVENOUS
  Filled 2018-05-17: qty 0.5

## 2018-05-17 NOTE — Procedures (Addendum)
Procedure: Bilateral knee aspiration and injection  Indication: Bilateral knee effusion(s)  Surgeon: Silvestre Gunner, PA-C  Assist: None  Anesthesia: None  EBL: None  Complications: None  Findings: After risks/benefits explained patient desires to undergo procedure. Consent obtained and time out performed. The bilateral knee was sterilely prepped and aspirated. 49ml clear yellow fluid obtained from left knee, Marcaine and 40mg  Depomedrol instilled. 39ml grossly bloody fluid obtained from right knee, Marcaine instilled. Pt tolerated the procedure well.    Lisette Abu, PA-C Orthopedic Surgery [phone]  Agree with above.   Johnny Bridge, MD

## 2018-05-17 NOTE — Progress Notes (Signed)
Patient ID: Cody Velez, male   DOB: 10-03-1946, 72 y.o.   MRN: [id number] Vital signs are stable Patient is having right knee pain and notes market swelling in the right knee that prevents him from moving it X-rays of right knee were obtained on the 20th after his fall I will ask orthopedics to evaluate his right knee Note he is anticoagulated on Coumadin.

## 2018-05-17 NOTE — Progress Notes (Signed)
[facility] PROGRESS NOTE  Cody Velez ALP:[id number] DOB: 02-16-1946 DOA: 05/08/2018  PCP: Asencion Noble, MD  Brief History/Interval Summary: 72 year old male with hypertension, hyperlipidemia, morbid obesity, CAD, chronic back pain status post spinal stimulator(C-spine) placed 1/30/19presented withincreasing mechanical falls at home. Patient was recently discharged from [facility] a stay from 03/30/2018 through 05/06/2018. The patient was initially admitted in early April 2019, found to have MSSA bacteremia. Extensive work-up at that time revealed epidural abscess and T10-11 osteomyelitis and discitis, underwent laminectomy, decompression of epidural abscess, and methacrylate screw augmentation on 03/28/2018 performed by Dr. Ellene Route.The patient was followed by infectious disease. They recommended cefazolin and rifampin for 8 weeks to be completed on 05/22/2018. He was subsequently discharged to inpatient rehabilitation on 03/30/2018. Patient was making significant progress at that time. However since discharge home, the patient has noted increasing lower extremity weakness and increasing falling.   Consultants:  Dr. Ellene Route [facility]  Procedures:  Myelogram  Antibiotics: None  Subjective/Interval History: Patient complains of pain in both his knees right more than left.  He did sustain an injury at the time of admission.  Denies any nausea vomiting.  ROS: Denies any chest pain or shortness of breath.  Objective:  Vital Signs  Vitals:   05/17/18 0004 05/17/18 0548 05/17/18 0731 05/17/18 1000  BP: 119/79 124/64 (!) 143/78 123/84  Pulse: 85 79 78 92  Resp: 18 18 20    Temp: 98.1 F (36.7 C) 98.8 F (37.1 C) 98 F (36.7 C) 97.7 F (36.5 C)  TempSrc: Oral Oral Oral Oral  SpO2: 96% 96% 97% 99%  Weight:      Height:        Intake/Output Summary (Last 24 hours) at 05/17/2018 1403 Last data filed at 05/17/2018 1012 Gross per 24 hour  Intake 1508 ml  Output 1205 ml    Net 303 ml   Filed Weights   05/11/18 0500 05/12/18 2310 05/15/18 0500  Weight: 123.5 kg (272 lb 4.3 oz) 114.7 kg (252 lb 13.9 oz) 125.6 kg (276 lb 14.4 oz)    General appearance: alert, cooperative, appears stated age and no distress Head: Normocephalic, without obvious abnormality, atraumatic Resp: clear to auscultation bilaterally Cardio: regular rate and rhythm, S1, S2 normal, no murmur, click, rub or gallop GI: soft, non-tender; bowel sounds normal; no masses,  no organomegaly Extremities: Right knee noted to be more swollen than the left.  Both knees are swollen.  Limited range of motion both the joints right more than left.  No erythema noted over either joints.  Effusion appears to be present. Neurologic: No focal neurological deficits  Lab Results:  Data Reviewed: I have personally reviewed following labs and imaging studies  CBC: Recent Labs  Lab 05/13/18 0236 05/14/18 0408 05/15/18 0411 05/16/18 0509 05/17/18 0553  WBC 7.4 7.8 7.3 8.8 8.7  HGB 8.9* 9.2* 9.1* 9.5* 9.4*  HCT 31.7* 32.5* 32.2* 33.4* 32.9*  MCV 83.4 82.1 81.9 83.1 81.4  PLT 552* 617* 578* 587* 625*    Basic Metabolic Panel: Recent Labs  Lab 05/11/18 0659 05/12/18 0522 05/13/18 0236 05/16/18 0509  NA  --  137 138 137  K  --  3.8 3.7 4.1  CL  --  102 105 102  CO2  --  27 27 27   GLUCOSE  --  77 126* 89  BUN  --  12 6 7   CREATININE  --  0.64 0.77 0.81  CALCIUM  --  8.7* 8.7* 8.8*  MG 2.0  --   --   --  GFR: Estimated Creatinine Clearance: 119.4 mL/min (by C-G formula based on SCr of 0.81 mg/dL).  Coagulation Profile: Recent Labs  Lab 05/13/18 0236 05/14/18 0408 05/15/18 0411 05/16/18 0509 05/17/18 0553  INR 1.18 1.19 1.25 1.24 1.58    Cardiac Enzymes: Recent Labs  Lab 05/11/18 0659  CKTOTAL 15*    Radiology Studies: No results found.   Medications:  Scheduled: . amLODipine  10 mg Oral Daily  . aspirin  81 mg Oral Daily  . baclofen  5 mg Oral TID  . docusate  sodium  200 mg Oral BID  . donepezil  10 mg Oral QHS  . finasteride  5 mg Oral Daily  . gabapentin  400 mg Oral TID  . latanoprost  1 drop Both Eyes QHS  . lidocaine (PF)  5 mL Other Once  . linaclotide  290 mcg Oral QAC breakfast  . lisinopril  5 mg Oral Daily  . Melatonin  1.5 mg Oral QHS  . memantine  10 mg Oral BID  . pantoprazole  40 mg Oral Daily  . polyethylene glycol  17 g Oral Daily  . potassium chloride SA  20 mEq Oral Daily  . pravastatin  10 mg Oral QPM  . rifampin  300 mg Oral Q12H  . sodium chloride flush  3 mL Intravenous Q12H  . traZODone  100 mg Oral QHS  . venlafaxine XR  150 mg Oral BID  . vitamin B-12  1,000 mcg Oral Daily  . warfarin  10 mg Oral ONCE-1800  . Warfarin - Pharmacist Dosing Inpatient   Does not apply q1800   Continuous: . sodium chloride 10 mL/hr at 05/15/18 1900  .  ceFAZolin (ANCEF) IV Stopped (05/17/18 1004)  . heparin 2,400 Units/hr (05/17/18 0509)   EGB:TDVVOH chloride, diazepam, fluticasone, oxyCODONE, sodium chloride flush  Assessment/Plan:      Bilateral lower extremity weakness, falls -CT cervical, thoracic, lumbar spine 5/22 showed new endplate erosion Y07-37 with a increased paravertebral inflammation, concern for septic arthritis -Myelogram completed on 5/24, reviewed by Dr. Ellene Route per neurosurgery, patient does not have evidence of canal compromise, Spinal cord compression epidural collections or neuro compromise from an anatomic basis. - Recommended neurology consult, seen by Dr. Orlena Sheldon, recommended PT OT evaluation for rehab -PT recommended inpatient rehab, evaluated by CIR?but denied.??Patient somewhat upset but agreeable to pursue other facilities.? -Added baclofen for right leg spasms  Bilateral knee pain  Patient noted to have swelling involving both his knees.  Right more than left.  Imaging studies done at the time of admission did not show any fractures.  He has not had any recent injuries since that time.  No  effusion was noted in those imaging studies but he appears to have developed effusion in both his joints.  Neurosurgery is consulted orthopedics.  He would likely need arthrocentesis.  Epidural abscess/vertebral osteomyelitis/discitis -Blood cultures had grown MSSA previously,  - continue IV cefazolin, rifampin, tentative stop date on 05/22/2018 -Status post I&D and laminectomy on 4/9  Acute DVT right peroneal vein, left soleal vein -Continue IV heparin bridge with warfarin per pharmacy.  We will transition to subcutaneous Lovenox once orthopedics has completed the work-up and treatment.  Essential hypertension Continue amlodipine, lisinopril, stable  Chronic pain syndrome -Currently stable, continue gabapentin   Depression Continue home dose of venlafaxine  Hyperlipidemia Continue statin  Coronary artery disease Currently no symptoms, no chest pain or shortness of breath  Cognitive impairment, possible underlying dementia Continue Aricept, Namenda  DVT Prophylaxis: Heparin and  warfarin    Code Status: Full code Family Communication: Discussed with the patient Disposition Plan: Hopefully discharge to skilled nursing facility in 1 to 2 days.    LOS: 6 days   [facility] Pager [phone] 05/17/2018, 2:03 PM  If 7PM-7AM, please contact night-coverage at [email or web address], password [facility]

## 2018-05-17 NOTE — Progress Notes (Signed)
Patient's home cpap is set up at the bedside and ready for use.

## 2018-05-17 NOTE — Consult Note (Addendum)
Reason for Consult:Bilateral knee pain Referring Physician: H Elsner  Cody Velez is an 72 y.o. male.  HPI: Cody Velez has had a complicated history leading to this point. To summarize, Cody Velez underwent lumbar surgery, developed an infection, improved enough to go to CIR and then onto SNF, was discharged from SNF and fell several times at home and was then readmitted 5/20. Knee x-rays at that point showed no acute issues but severe OA. Cody Velez has not gotten any better from that standpoint since admission and feels like it's acutely worse in the last 2-3d. Cody Velez denies fevers, chills, sweats, N/V. No hx/o gout or rheumatologic disease.  Past Medical History:  Diagnosis Date  . Allergic rhinitis   . Arthritis   . Asthma    as a child  . B12 deficiency   . Cancer [facility])    prostate  . Cervicogenic headache 01/28/2016  . Chronic back pain   . Coronary atherosclerosis of native coronary artery    Mild nonobstructive CAD at catheterization January 2015  . Depression   . Essential hypertension, benign   . Falls   . GERD (gastroesophageal reflux disease)   . History of cerebrovascular disease 07/23/2015  . History of pneumonia 02/2011  . Hyperlipidemia   . Kidney stone   . Memory difficulty 07/23/2015  . OSA (obstructive sleep apnea)    CPAP - Dr. [name]  . Prostate cancer ([facility])   . PTSD (post-traumatic stress disorder)    [place]  . Rectal bleeding     Past Surgical History:  Procedure Laterality Date  . APPLICATION OF ROBOTIC ASSISTANCE FOR SPINAL PROCEDURE N/A 03/28/2018   Procedure: APPLICATION OF ROBOTIC ASSISTANCE FOR SPINAL PROCEDURE;  Surgeon: Kristeen Miss, MD;  Location: [facility];  Service: Neurosurgery;  Laterality: N/A;  . BACK SURGERY  02/14/12   lumbar OR #7; "today redid L1L2; replaced screws; added bone from hip"  . BILATERAL KNEE ARTHROSCOPY    . COLONOSCOPY  10/15/2008   Dr. [name]: tubular adenoma   . COLONOSCOPY  12/17/2003   [facility]:NFAOZH rectal and colon  . COLONOSCOPY N/A  09/05/2014   Procedure: COLONOSCOPY;  Surgeon: Daneil Dolin, MD;  [facility];  Service: Endoscopy;  Laterality: N/A;  7:30-rescheduled 9/17 to [place] notified pt  . CYSTOSCOPY WITH STENT PLACEMENT Right 01/27/2016   Procedure: CYSTOSCOPY WITH STENT PLACEMENT;  Surgeon: [name], MD;  Location: [facility];  Service: Urology;  Laterality: Right;  . CYSTOSCOPY/RETROGRADE/URETEROSCOPY/STONE EXTRACTION WITH BASKET Right 01/27/2016   Procedure: CYSTOSCOPY, RIGHT RETROGRADE, RIGHT URETEROSCOPY, STONE EXTRACTION ;  Surgeon: [name], MD;  Location: [facility];  Service: Urology;  Laterality: Right;  . ESOPHAGOGASTRODUODENOSCOPY  10/15/2008     Dr Rourk:Schatzki's ring status post dilation and disruption via 81 F Maloney dilator/ otherwise unremarkable esophagus, small hiatal hernia, multiple fundal gland polyps not manipulated, gastritis, negative H.pylori  . ESOPHAGOGASTRODUODENOSCOPY  06/21/02   YQM:VHQIO sliding hiatal hernia with mild changes of reflux esophagitis limited to gastroesophageal junction.  Noncritical ring at distal esophagus, 3 cm proximal to gastroesophageal junction/Antral gastritis  . [place]   "broke face playing softball"  . FRACTURE SURGERY     "left wrist; broke it; took spur off"  . HOLMIUM LASER APPLICATION Right 08/26/2951   Procedure: HOLMIUM LASER APPLICATION;  Surgeon: [name], MD;  Location: [facility];  Service: Urology;  Laterality: Right;  . LEFT HEART CATHETERIZATION WITH CORONARY ANGIOGRAM N/A 12/27/2013   Procedure: LEFT HEART CATHETERIZATION WITH CORONARY  Cyril Loosen;  Surgeon: [name], MD;  Location: [facility] CATH LAB;  Service: Cardiovascular;  Laterality: N/A;  . neck epidural    . POSTERIOR LUMBAR FUSION 4 LEVEL N/A 03/28/2018   Procedure: Thoracic eight -Lumbar two- FIXATION WITH SCREW PLACEMENT, DECOMPRESSION Thoracic ten-Thoracic eleven  FOR OSTEOMYELITIS;  Surgeon: Kristeen Miss, MD;  Location: [facility];   Service: Neurosurgery;  Laterality: N/A;  . SHOULDER SURGERY Bilateral   . TEE WITHOUT CARDIOVERSION N/A 03/21/2018   Procedure: TRANSESOPHAGEAL ECHOCARDIOGRAM (TEE) WITH PROPOFOL;  Surgeon: [name], MD;  [facility];  Service: Cardiovascular;  Laterality: N/A;    Family History  Problem Relation Age of Onset  . Emphysema Father   . Heart failure Father   . Lung cancer Father   . CAD Father   . Colon cancer Mother   . Stroke Mother   . Breast cancer Mother   . Stroke Sister   . Heart attack Brother   . Dementia Paternal Uncle   . Emphysema Maternal Grandmother   . Stroke Maternal Grandmother   . Asthma Other        grandson  . Heart disease Paternal Grandfather   . Anesthesia problems Neg Hx   . Hypotension Neg Hx   . Malignant hyperthermia Neg Hx   . Pseudochol deficiency Neg Hx     Social History:  reports that Cody Velez quit smoking about 59 years ago. His smoking use included cigarettes. Cody Velez has quit using smokeless tobacco. His smokeless tobacco use included chew. Cody Velez reports that Cody Velez drinks alcohol. Cody Velez reports that Cody Velez does not use drugs.  Allergies: No Known Allergies  Medications: I have reviewed the patient's current medications.  Results for orders placed or performed during the hospital encounter of 05/08/18 (from the past 48 hour(s))  Protime-INR     Status: Abnormal   Collection Time: 05/16/18  5:09 AM  Result Value Ref Range   Prothrombin Time 15.5 (H) 11.4 - 15.2 seconds   INR 1.24     Comment: Performed at [facility] [address] [place], [place] [address]  CBC     Status: Abnormal   Collection Time: 05/16/18  5:09 AM  Result Value Ref Range   WBC 8.8 4.0 - 10.5 K/uL   RBC 4.02 (L) 4.22 - 5.81 MIL/uL   Hemoglobin 9.5 (L) 13.0 - 17.0 g/dL   HCT 33.4 (L) 39.0 - 52.0 %   MCV 83.1 78.0 - 100.0 fL   MCH 23.6 (L) 26.0 - 34.0 pg   MCHC 28.4 (L) 30.0 - 36.0 g/dL   RDW 16.9 (H) 11.5 - 15.5 %   Platelets 587 (H) 150 - 400 K/uL     Comment: Performed at [facility], [place] [address], [place], [place] [address]  Basic metabolic panel     Status: Abnormal   Collection Time: 05/16/18  5:09 AM  Result Value Ref Range   Sodium 137 135 - 145 mmol/L   Potassium 4.1 3.5 - 5.1 mmol/L   Chloride 102 101 - 111 mmol/L   CO2 27 22 - 32 mmol/L   Glucose, Bld 89 65 - 99 mg/dL   BUN 7 6 - 20 mg/dL   Creatinine, Ser 0.81 0.61 - 1.24 mg/dL   Calcium 8.8 (L) 8.9 - 10.3 mg/dL   GFR calc non Af Amer >60 >60 mL/min   GFR calc Af Amer >60 >60 mL/min    Comment: (NOTE) The eGFR has been calculated using the  CKD EPI equation. This calculation has not been validated in all clinical situations. eGFR's persistently <60 mL/min signify possible Chronic Kidney Disease.    Anion gap 8 5 - 15    Comment: Performed at [facility] [address], [place], [place] [address]  Heparin level (unfractionated)     Status: None   Collection Time: 05/16/18  5:11 AM  Result Value Ref Range   Heparin Unfractionated 0.37 0.30 - 0.70 IU/mL    Comment: (NOTE) If heparin results are below expected values, and patient dosage has  been confirmed, suggest follow up testing of antithrombin III levels. Performed at [facility], [place] [address], [place], [place] [address]   Protime-INR     Status: Abnormal   Collection Time: 05/17/18  5:53 AM  Result Value Ref Range   Prothrombin Time 18.8 (H) 11.4 - 15.2 seconds   INR 1.58     Comment: Performed at [facility] [address], [place], [place] [address]  CBC     Status: Abnormal   Collection Time: 05/17/18  5:53 AM  Result Value Ref Range   WBC 8.7 4.0 - 10.5 K/uL   RBC 4.04 (L) 4.22 - 5.81 MIL/uL   Hemoglobin 9.4 (L) 13.0 - 17.0 g/dL   HCT 32.9 (L) 39.0 - 52.0 %   MCV 81.4 78.0 - 100.0 fL   MCH 23.3 (L) 26.0 - 34.0 pg   MCHC 28.6 (L) 30.0 - 36.0 g/dL   RDW 17.0 (H) 11.5 - 15.5 %   Platelets 625 (H) 150 - 400 K/uL    Comment: Performed at [facility], [place]  [address] [place], [place] [address]  Heparin level (unfractionated)     Status: None   Collection Time: 05/17/18  5:53 AM  Result Value Ref Range   Heparin Unfractionated 0.41 0.30 - 0.70 IU/mL    Comment: (NOTE) If heparin results are below expected values, and patient dosage has  been confirmed, suggest follow up testing of antithrombin III levels. Performed at [facility], [place] [address], [place], [place] [address]     No results found.  Review of Systems  Constitutional: Negative for weight loss.  HENT: Negative for ear discharge, ear pain, hearing loss and tinnitus.   Eyes: Negative for blurred vision, double vision, photophobia and pain.  Respiratory: Negative for cough, sputum production and shortness of breath.   Cardiovascular: Negative for chest pain.  Gastrointestinal: Negative for abdominal pain, nausea and vomiting.  Genitourinary: Negative for dysuria, flank pain, frequency and urgency.  Musculoskeletal: Positive for back pain, falls and joint pain (Bilateral knees, R>L). Negative for myalgias and neck pain.  Neurological: Negative for dizziness, tingling, sensory change, focal weakness, loss of consciousness and headaches.  Endo/Heme/Allergies: Does not bruise/bleed easily.  Psychiatric/Behavioral: Negative for depression, memory loss and substance abuse. The patient is not nervous/anxious.    Blood pressure 123/84, pulse 92, temperature 97.7 F (36.5 C), temperature source Oral, resp. rate 20, height _0  (1.905 m), weight 125.6 kg (276 lb 14.4 oz), SpO2 99 %. Physical Exam  Constitutional: Cody Velez appears well-developed and well-nourished. No distress.  HENT:  Head: Normocephalic and atraumatic.  Eyes: Conjunctivae are normal. Right eye exhibits no discharge. Left eye exhibits no discharge. No scleral icterus.  Neck: Normal range of motion.  Cardiovascular: Normal rate and regular rhythm.  Respiratory: Effort normal. No respiratory distress.  Musculoskeletal:   RLE No traumatic wounds, ecchymosis, or rash  Mod-severe  TTP knee, large effusion, essentially no AROM, PROM limited to about 20 degrees  No ankle effusion  Sens DPN, SPN intact, TN paresthetic  Motor EHL, ext, flex, evers 5/5  DP 2+, PT 2+, No significant edema  LLE No traumatic wounds, ecchymosis, or rash  Mod TTP knee, mod effusion, very little AROM, PROM limited to about 20 degrees  No ankle effusion  Sens DPN, SPN, TN intact  Motor EHL, ext, flex, evers 5/5  DP 2+, PT 2+, No significant edema  Neurological: Cody Velez is alert.  Skin: Skin is warm and dry. Cody Velez is not diaphoretic.  Psychiatric: Cody Velez has a normal mood and affect. His behavior is normal.    Assessment/Plan: Bilateral knee pain -- The differential is broad and includes reactive arthritis, gout/psuedogout, septic arthritis, or simply an exacerbation of his OA. Will aspirate both knees and send fluid for analysis to help with narrowing etiology. Dr. Mardelle Matte to f/u with fluid analysis, GS, and culture.    Lisette Abu, PA-C Orthopedic Surgery [phone] 05/17/2018, 12:11 PM   Discussed and agree with above.  Will await results from fluid aspiration.  Likely arthritic, but will check cell count in face of spine infection.   Johnny Bridge, MD

## 2018-05-17 NOTE — Progress Notes (Signed)
ANTICOAGULATION CONSULT NOTE - Follow Up Consult  Pharmacy Consult for Heparin/Coumadin Indication: DVT  No Known Allergies  Patient Measurements: Height: 6\' 3"  (190.5 cm) Weight: 276 lb 14.4 oz (125.6 kg) IBW/kg (Calculated) : 84.5 Heparin Dosing Weight:    Vital Signs: Temp: 97.7 F (36.5 C) (05/29 1000) Temp Source: Oral (05/29 1000) BP: 123/84 (05/29 1000) Pulse Rate: 92 (05/29 1000)  Labs: Recent Labs    05/15/18 0411 05/16/18 0509 05/16/18 0511 05/17/18 0553  HGB 9.1* 9.5*  --  9.4*  HCT 32.2* 33.4*  --  32.9*  PLT 578* 587*  --  625*  LABPROT 15.6* 15.5*  --  18.8*  INR 1.25 1.24  --  1.58  HEPARINUNFRC 0.36  --  0.37 0.41  CREATININE  --  0.81  --   --     Estimated Creatinine Clearance: 119.4 mL/min (by C-G formula based on SCr of 0.81 mg/dL).  Assessment: 42 YOM with known epidural abscess on Cefazolin + Rifampin who presented on 5/20 with frequent falls. The patient was on Warfarin for anticoagulation PTA for hx DVT. Doses were held and reversed for myelogram  - resumed post-op with Heparin bridge. Pharmacy consulted to dose.   Heparin level this morning is therapeutic (HL 0.41 << 0.37, goal of 0.3-0.7), INR remains SUBtherapeutic though trending up (INR 1.58 << 1.24). CBC stable - no bleeding noted at this time.  Noted DDI with Rifampin and Warfarin - will monitor.   Goal of Therapy:  Heparin level 0.3-0.7 units/ml  INR 2-3 Monitor platelets by anticoagulation protocol: Yes   Plan:  - Continue Heparin at 2400 units/hr (24 ml/hr) - Warfarin 10 mg x 1 dose at 1800 today - Will continue to monitor for any signs/symptoms of bleeding and will follow up with heparin level and PT/INR in the a.m.   Thank you for allowing pharmacy to be a part of this patient's care.  Alycia Rossetti, PharmD, BCPS Clinical Pharmacist Pager: [phone] Clinical phone for 05/17/2018 from 7a-3:30p: [phone] If after 3:30p, please call main pharmacy at: [phone] 05/17/2018 11:26  AM

## 2018-05-18 ENCOUNTER — Inpatient Hospital Stay (HOSPITAL_COMMUNITY): Payer: Medicare Other | Admitting: Anesthesiology

## 2018-05-18 ENCOUNTER — Encounter (HOSPITAL_COMMUNITY)
Admission: EM | Disposition: A | Discharge status: Skilled Nursing Facility | Payer: Self-pay | Source: Home / Self Care | Attending: Internal Medicine

## 2018-05-18 ENCOUNTER — Encounter (HOSPITAL_COMMUNITY): Payer: Self-pay

## 2018-05-18 DIAGNOSIS — Z87891 Personal history of nicotine dependence: Secondary | ICD-10-CM

## 2018-05-18 DIAGNOSIS — M25462 Effusion, left knee: Secondary | ICD-10-CM

## 2018-05-18 DIAGNOSIS — I10 Essential (primary) hypertension: Secondary | ICD-10-CM

## 2018-05-18 DIAGNOSIS — M25461 Effusion, right knee: Secondary | ICD-10-CM

## 2018-05-18 DIAGNOSIS — G062 Extradural and subdural abscess, unspecified: Secondary | ICD-10-CM

## 2018-05-18 DIAGNOSIS — B9561 Methicillin susceptible Staphylococcus aureus infection as the cause of diseases classified elsewhere: Secondary | ICD-10-CM

## 2018-05-18 HISTORY — PX: KNEE ARTHROSCOPY: SHX127

## 2018-05-18 LAB — PROTIME-INR
INR: 1.78
Prothrombin Time: 20.6 seconds — ABNORMAL HIGH (ref 11.4–15.2)

## 2018-05-18 LAB — CBC
HCT: 33.3 % — ABNORMAL LOW (ref 39.0–52.0)
Hemoglobin: 9.4 g/dL — ABNORMAL LOW (ref 13.0–17.0)
MCH: 23.3 pg — ABNORMAL LOW (ref 26.0–34.0)
MCHC: 28.2 g/dL — ABNORMAL LOW (ref 30.0–36.0)
MCV: 82.4 fL (ref 78.0–100.0)
Platelets: 588 10*3/uL — ABNORMAL HIGH (ref 150–400)
RBC: 4.04 MIL/uL — ABNORMAL LOW (ref 4.22–5.81)
RDW: 16.9 % — ABNORMAL HIGH (ref 11.5–15.5)
WBC: 8.3 10*3/uL (ref 4.0–10.5)

## 2018-05-18 LAB — HEPARIN LEVEL (UNFRACTIONATED): Heparin Unfractionated: 0.33 IU/mL (ref 0.30–0.70)

## 2018-05-18 SURGERY — ARTHROSCOPY, KNEE
Anesthesia: General | Site: Knee | Laterality: Right

## 2018-05-18 MED ORDER — HYDROCODONE-ACETAMINOPHEN 7.5-325 MG PO TABS: 1.0000 | ORAL_TABLET | Freq: Once | ORAL | Status: DC | PRN

## 2018-05-18 MED ORDER — PREDNISONE 20 MG PO TABS
40.0000 mg | ORAL_TABLET | Freq: Every day | ORAL | Status: DC
  Administered 2018-05-18 – 2018-05-20 (×3): 40 mg via ORAL
  Filled 2018-05-18 (×3): qty 2

## 2018-05-18 MED ORDER — ONDANSETRON HCL 4 MG/2ML IJ SOLN
INTRAMUSCULAR | Status: AC
  Filled 2018-05-18: qty 4

## 2018-05-18 MED ORDER — ACETAMINOPHEN 10 MG/ML IV SOLN: 1000.0000 mg | Freq: Once | INTRAVENOUS | Status: DC | PRN

## 2018-05-18 MED ORDER — BUPIVACAINE HCL 0.5 % IJ SOLN
INTRAMUSCULAR | Status: DC | PRN
  Administered 2018-05-18: 10 mL

## 2018-05-18 MED ORDER — CEFAZOLIN SODIUM-DEXTROSE 2-4 GM/100ML-% IV SOLN
2.0000 g | Freq: Three times a day (TID) | INTRAVENOUS | Status: DC
  Administered 2018-05-19 (×2): 2 g via INTRAVENOUS
  Filled 2018-05-18 (×2): qty 100

## 2018-05-18 MED ORDER — EPHEDRINE SULFATE 50 MG/ML IJ SOLN
INTRAMUSCULAR | Status: DC | PRN
  Administered 2018-05-18: 10 mg via INTRAVENOUS

## 2018-05-18 MED ORDER — MEPERIDINE HCL 50 MG/ML IJ SOLN: 6.2500 mg | INTRAMUSCULAR | Status: DC | PRN

## 2018-05-18 MED ORDER — POVIDONE-IODINE 10 % EX SWAB: 2.0000 "application " | Freq: Once | CUTANEOUS | Status: DC

## 2018-05-18 MED ORDER — FENTANYL CITRATE (PF) 100 MCG/2ML IJ SOLN
INTRAMUSCULAR | Status: DC | PRN
  Administered 2018-05-18: 25 ug via INTRAVENOUS
  Administered 2018-05-18: 50 ug via INTRAVENOUS
  Administered 2018-05-18 (×2): 25 ug via INTRAVENOUS

## 2018-05-18 MED ORDER — DEXTROSE 5 % IV SOLN
INTRAVENOUS | Status: DC | PRN
  Administered 2018-05-18: 25 ug/min via INTRAVENOUS

## 2018-05-18 MED ORDER — LIDOCAINE HCL (CARDIAC) PF 100 MG/5ML IV SOSY
PREFILLED_SYRINGE | INTRAVENOUS | Status: DC | PRN
  Administered 2018-05-18: 50 mg via INTRAVENOUS

## 2018-05-18 MED ORDER — LIDOCAINE 2% (20 MG/ML) 5 ML SYRINGE: INTRAMUSCULAR | Status: DC | PRN

## 2018-05-18 MED ORDER — SUCCINYLCHOLINE CHLORIDE 200 MG/10ML IV SOSY
PREFILLED_SYRINGE | INTRAVENOUS | Status: AC
  Filled 2018-05-18: qty 10

## 2018-05-18 MED ORDER — PROPOFOL 10 MG/ML IV BOLUS
INTRAVENOUS | Status: AC
  Filled 2018-05-18: qty 20

## 2018-05-18 MED ORDER — COLCHICINE 0.6 MG PO TABS
0.6000 mg | ORAL_TABLET | Freq: Every day | ORAL | Status: DC
  Administered 2018-05-18 – 2018-05-22 (×5): 0.6 mg via ORAL
  Filled 2018-05-18 (×5): qty 1

## 2018-05-18 MED ORDER — CHLORHEXIDINE GLUCONATE 4 % EX LIQD
60.0000 mL | Freq: Once | CUTANEOUS | Status: AC
  Administered 2018-05-18: 4 via TOPICAL
  Filled 2018-05-18: qty 60

## 2018-05-18 MED ORDER — PHENYLEPHRINE 40 MCG/ML (10ML) SYRINGE FOR IV PUSH (FOR BLOOD PRESSURE SUPPORT)
PREFILLED_SYRINGE | INTRAVENOUS | Status: AC
  Filled 2018-05-18: qty 20

## 2018-05-18 MED ORDER — HYDROMORPHONE HCL-NACL 0.5-0.9 MG/ML-% IV SOSY: 0.2500 mg | PREFILLED_SYRINGE | INTRAVENOUS | Status: DC | PRN

## 2018-05-18 MED ORDER — PHENYLEPHRINE 40 MCG/ML (10ML) SYRINGE FOR IV PUSH (FOR BLOOD PRESSURE SUPPORT)
PREFILLED_SYRINGE | INTRAVENOUS | Status: DC | PRN
  Administered 2018-05-18: 100 ug via INTRAVENOUS

## 2018-05-18 MED ORDER — PROPOFOL 10 MG/ML IV BOLUS
INTRAVENOUS | Status: DC | PRN
  Administered 2018-05-18: 200 mg via INTRAVENOUS

## 2018-05-18 MED ORDER — DEXTROSE 5 % IV SOLN
3.0000 g | INTRAVENOUS | Status: AC
  Administered 2018-05-18: 3 g via INTRAVENOUS
  Filled 2018-05-18 (×2): qty 3000

## 2018-05-18 MED ORDER — FENTANYL CITRATE (PF) 250 MCG/5ML IJ SOLN
INTRAMUSCULAR | Status: AC
  Filled 2018-05-18: qty 5

## 2018-05-18 MED ORDER — ROCURONIUM BROMIDE 10 MG/ML (PF) SYRINGE
PREFILLED_SYRINGE | INTRAVENOUS | Status: AC
  Filled 2018-05-18: qty 10

## 2018-05-18 MED ORDER — BUPIVACAINE HCL (PF) 0.5 % IJ SOLN
INTRAMUSCULAR | Status: AC
  Filled 2018-05-18: qty 30

## 2018-05-18 MED ORDER — WARFARIN SODIUM 5 MG PO TABS
10.0000 mg | ORAL_TABLET | Freq: Once | ORAL | Status: AC
  Administered 2018-05-18: 10 mg via ORAL
  Filled 2018-05-18: qty 1

## 2018-05-18 MED ORDER — ENOXAPARIN SODIUM 150 MG/ML ~~LOC~~ SOLN
130.0000 mg | Freq: Two times a day (BID) | SUBCUTANEOUS | Status: DC
  Administered 2018-05-18 – 2018-05-19 (×2): 130 mg via SUBCUTANEOUS
  Filled 2018-05-18 (×2): qty 0.87

## 2018-05-18 MED ORDER — ONDANSETRON HCL 4 MG/2ML IJ SOLN
INTRAMUSCULAR | Status: DC | PRN
  Administered 2018-05-18: 4 mg via INTRAVENOUS

## 2018-05-18 MED ORDER — LIDOCAINE 2% (20 MG/ML) 5 ML SYRINGE
INTRAMUSCULAR | Status: AC
  Filled 2018-05-18: qty 15

## 2018-05-18 MED ORDER — EPHEDRINE SULFATE 50 MG/ML IJ SOLN
INTRAMUSCULAR | Status: AC
  Filled 2018-05-18: qty 1

## 2018-05-18 MED ORDER — DEXAMETHASONE SODIUM PHOSPHATE 4 MG/ML IJ SOLN
INTRAMUSCULAR | Status: DC | PRN
  Administered 2018-05-18: 5 mg via INTRAVENOUS

## 2018-05-18 MED ORDER — DEXAMETHASONE SODIUM PHOSPHATE 10 MG/ML IJ SOLN
INTRAMUSCULAR | Status: AC
  Filled 2018-05-18: qty 2

## 2018-05-18 MED ORDER — PROMETHAZINE HCL 25 MG/ML IJ SOLN: 6.2500 mg | INTRAMUSCULAR | Status: DC | PRN

## 2018-05-18 MED ORDER — LACTATED RINGERS IV SOLN
INTRAVENOUS | Status: DC
  Administered 2018-05-18: 12:00:00 via INTRAVENOUS

## 2018-05-18 MED ORDER — PHENYLEPHRINE HCL 10 MG/ML IJ SOLN
INTRAMUSCULAR | Status: AC
  Filled 2018-05-18: qty 1

## 2018-05-18 MED ORDER — SODIUM CHLORIDE 0.9 % IR SOLN
Status: DC | PRN
  Administered 2018-05-18: 3000 mL
  Administered 2018-05-18: 6000 mL

## 2018-05-18 SURGICAL SUPPLY — 36 items
BANDAGE ACE 6X5 VEL STRL LF (GAUZE/BANDAGES/DRESSINGS) ×3 IMPLANT
BLADE SURG 11 STRL SS (BLADE) ×2 IMPLANT
COVER SURGICAL LIGHT HANDLE (MISCELLANEOUS) ×3 IMPLANT
CUTTER MENISCUS 3.5MM 6/BX (BLADE) IMPLANT
DRAPE ARTHROSCOPY W/POUCH 114 (DRAPES) ×3 IMPLANT
DRAPE U-SHAPE 47X51 STRL (DRAPES) ×3 IMPLANT
DRSG PAD ABDOMINAL 8X10 ST (GAUZE/BANDAGES/DRESSINGS) ×2 IMPLANT
GAUZE SPONGE 4X4 12PLY STRL (GAUZE/BANDAGES/DRESSINGS) ×2 IMPLANT
GAUZE XEROFORM 1X8 LF (GAUZE/BANDAGES/DRESSINGS) IMPLANT
GLOVE BIO SURGEON STRL SZ7.5 (GLOVE) ×3 IMPLANT
GLOVE BIOGEL PI IND STRL 8 (GLOVE) ×1 IMPLANT
GLOVE BIOGEL PI INDICATOR 8 (GLOVE) ×2
GLOVE BIOGEL PI ORTHO PRO SZ8 (GLOVE) ×2
GLOVE PI ORTHO PRO STRL SZ8 (GLOVE) ×1 IMPLANT
GLOVE SURG ORTHO 8.0 STRL STRW (GLOVE) ×6 IMPLANT
GOWN STRL REUS W/ TWL LRG LVL3 (GOWN DISPOSABLE) ×2 IMPLANT
GOWN STRL REUS W/ TWL XL LVL3 (GOWN DISPOSABLE) ×2 IMPLANT
GOWN STRL REUS W/TWL LRG LVL3 (GOWN DISPOSABLE) ×3
GOWN STRL REUS W/TWL XL LVL3 (GOWN DISPOSABLE) ×6
KIT TURNOVER KIT B (KITS) ×3 IMPLANT
MANIFOLD NEPTUNE II (INSTRUMENTS) IMPLANT
NDL SPNL 18GX3.5 QUINCKE PK (NEEDLE) IMPLANT
NEEDLE 22X1 1/2 (OR ONLY) (NEEDLE) ×3 IMPLANT
NEEDLE SPNL 18GX3.5 QUINCKE PK (NEEDLE) IMPLANT
PACK ARTHROSCOPY DSU (CUSTOM PROCEDURE TRAY) ×3 IMPLANT
PAD ARMBOARD 7.5X6 YLW CONV (MISCELLANEOUS) ×6 IMPLANT
PADDING CAST COTTON 6X4 STRL (CAST SUPPLIES) ×2 IMPLANT
SET ARTHROSCOPY TUBING (MISCELLANEOUS) ×3
SET ARTHROSCOPY TUBING LN (MISCELLANEOUS) ×1 IMPLANT
SPONGE LAP 4X18 RFD (DISPOSABLE) ×3 IMPLANT
SYR CONTROL 10ML LL (SYRINGE) ×2 IMPLANT
TOWEL OR 17X26 10 PK STRL BLUE (TOWEL DISPOSABLE) ×3 IMPLANT
TUBE CONNECTING 12'X1/4 (SUCTIONS) ×1
TUBE CONNECTING 12X1/4 (SUCTIONS) ×2 IMPLANT
WAND HAND CNTRL MULTIVAC 90 (MISCELLANEOUS) IMPLANT
WATER STERILE IRR 1000ML POUR (IV SOLUTION) ×3 IMPLANT

## 2018-05-18 NOTE — Anesthesia Preprocedure Evaluation (Signed)
Anesthesia Evaluation  Patient identified by MRN, date of birth, ID band Patient awake    Reviewed: Allergy & Precautions, NPO status , Patient's Chart, lab work & pertinent test results  Airway Mallampati: II  TM Distance: >3 FB Neck ROM: Full    Dental no notable dental hx. (+) Teeth Intact, Dental Advisory Given   Pulmonary asthma , sleep apnea , former smoker,    Pulmonary exam normal breath sounds clear to auscultation       Cardiovascular hypertension, Pt. on medications + CAD and + Peripheral Vascular Disease  Normal cardiovascular exam Rhythm:Regular Rate:Normal  Echo report 03/21/18 - Left ventricle: Systolic function was normal. The estimated   ejection fraction was in the range of 55% to 60%   Neuro/Psych Anxiety    GI/Hepatic Neg liver ROS, GERD  ,  Endo/Other    Renal/GU      Musculoskeletal   Abdominal (+) + obese,   Peds  Hematology  (+) anemia ,   Anesthesia Other Findings   Reproductive/Obstetrics                             Lab Results  Component Value Date   CREATININE 0.81 05/16/2018   BUN 7 05/16/2018   NA 137 05/16/2018   K 4.1 05/16/2018   CL 102 05/16/2018   CO2 27 05/16/2018    Lab Results  Component Value Date   WBC 8.7 05/17/2018   HGB 9.4 (L) 05/17/2018   HCT 32.9 (L) 05/17/2018   MCV 81.4 05/17/2018   PLT 625 (H) 05/17/2018    Anesthesia Physical Anesthesia Plan  ASA: III  Anesthesia Plan: General   Post-op Pain Management:    Induction: Intravenous  PONV Risk Score and Plan: Treatment may vary due to age or medical condition  Airway Management Planned: LMA  Additional Equipment:   Intra-op Plan:   Post-operative Plan: Extubation in OR  Informed Consent: I have reviewed the patients History and Physical, chart, labs and discussed the procedure including the risks, benefits and alternatives for the proposed anesthesia with the  patient or authorized representative who has indicated his/her understanding and acceptance.   Dental advisory given  Plan Discussed with: CRNA  Anesthesia Plan Comments:         Anesthesia Quick Evaluation

## 2018-05-18 NOTE — Progress Notes (Signed)
Knee aspirate values reviewed.  CT scan of the right knee demonstrated some air within the joint which may be from the tap, tricompartmental arthritis, large hemarthrosis.  Results from the knee aspirate demonstrated 68,000 white blood cells, with intracellular calcium pyrophosphate crystals consistent with pseudogout.  There is concern for infection given the clinical and his coexisting spine osteomyelitis.  There is a comment that the cell count may not be accurate due to fibrin clumps.  Results from the left knee demonstrates only 9700 white blood cells, but also has intracellular calcium pyrophosphate crystals consistent with pseudogout.  Gram stain and culture from both knees are negative to date.  In aggregate, the right knee looks like pseudogout but may have a concerning element given the white count, and to be on the safe side given the clinical presentation associated with osteomyelitis, I am recommending arthroscopic washout.  The left side however does not look like it is infected based on the white count, the negative Gram stain, and negative culture conservative management is recommended.  Plan for surgery later today, today, and will discuss further details with the patient in preop, also Hilbert Odor, PA-C has discussed the risks benefits and alternatives with him.

## 2018-05-18 NOTE — Progress Notes (Signed)
[facility] PROGRESS NOTE  DEMPSY DAMIANO DDU:[id number] DOB: September 22, 1946 DOA: 05/08/2018  PCP: Asencion Noble, MD  Brief History/Interval Summary: 72 year old male with hypertension, hyperlipidemia, morbid obesity, CAD, chronic back pain status post spinal stimulator(C-spine) placed 1/30/19presented withincreasing mechanical falls at home. Patient was recently discharged from [facility] a stay from 03/30/2018 through 05/06/2018. The patient was initially admitted in early April 2019, found to have MSSA bacteremia. Extensive work-up at that time revealed epidural abscess and T10-11 osteomyelitis and discitis, underwent laminectomy, decompression of epidural abscess, and methacrylate screw augmentation on 03/28/2018 performed by Dr. Ellene Route.The patient was followed by infectious disease. They recommended cefazolin and rifampin for 8 weeks to be completed on 05/22/2018. He was subsequently discharged to inpatient rehabilitation on 03/30/2018. Patient was making significant progress at that time. However since discharge home, the patient has noted increasing lower extremity weakness and increasing falling.  Patient was hospitalized.  Seen by neurosurgery.  Was waiting on placement to skilled nursing facility.  He was noted to have swelling of his right knee.  Orthopedics was consulted.  There was concern for pseudogout versus infection.  Hemarthrosis was also noted.  He was taken to the OR for washout.  Consultants:  Dr. Ellene Route Orthopedics  Procedures:  Myelogram  RIGHT KNEE ARTHROSCOPY, PARTIAL MEDIAL MENISECTOMY AND SURGICAL LAVAGE AND CHONDROPLASTY   Antibiotics: Cefazolin and rifampin  Subjective/Interval History: Patient continues to have limited range of motion of the right knee.  Continues to have pain.  The left knee has improved.  Denies any nausea vomiting.    ROS: Denies any chest pain.  Objective:  Vital Signs  Vitals:   05/18/18 0508 05/18/18 0736 05/18/18 1142  05/18/18 1422  BP: (!) 148/87 (!) 114/99  127/80  Pulse: 79 72  90  Resp: 18 16  15   Temp: 98.3 F (36.8 C) 98 F (36.7 C)  (P) 97.8 F (36.6 C)  TempSrc: Oral Oral    SpO2: 99% 99%  96%  Weight:   128 kg (282 lb 3 oz)   Height:   6\' 3"  (1.905 m)     Intake/Output Summary (Last 24 hours) at 05/18/2018 1427 Last data filed at 05/18/2018 1332 Gross per 24 hour  Intake 840 ml  Output 652 ml  Net 188 ml   Filed Weights   05/15/18 0500 05/18/18 0308 05/18/18 1142  Weight: 125.6 kg (276 lb 14.4 oz) 128 kg (282 lb 3 oz) 128 kg (282 lb 3 oz)    General appearance: Awake alert.  In no distress. Resp: Clear to auscultation bilaterally.  No wheezing rales or rhonchi. Cardio: S1-S2 is normal regular.  No S3-S4.  No rubs murmurs GI: Abdomen is soft.  Nontender nondistended.  Bowel sounds are present.  No masses organomegaly. Extremities: Right knee continues to be more swollen than the left.  However today there is improved range of motion of the left knee compared to the right.  No erythema is noted.  Effusion is present.   Neurologic: No focal deficits  Lab Results:  Data Reviewed: I have personally reviewed following labs and imaging studies  CBC: Recent Labs  Lab 05/13/18 0236 05/14/18 0408 05/15/18 0411 05/16/18 0509 05/17/18 0553  WBC 7.4 7.8 7.3 8.8 8.7  HGB 8.9* 9.2* 9.1* 9.5* 9.4*  HCT 31.7* 32.5* 32.2* 33.4* 32.9*  MCV 83.4 82.1 81.9 83.1 81.4  PLT 552* 617* 578* 587* 625*    Basic Metabolic Panel: Recent Labs  Lab 05/12/18 0522 05/13/18 0236 05/16/18 0509  NA  137 138 137  K 3.8 3.7 4.1  CL 102 105 102  CO2 27 27 27   GLUCOSE 77 126* 89  BUN 12 6 7   CREATININE 0.64 0.77 0.81  CALCIUM 8.7* 8.7* 8.8*    GFR: Estimated Creatinine Clearance: 120.6 mL/min (by C-G formula based on SCr of 0.81 mg/dL).  Coagulation Profile: Recent Labs  Lab 05/14/18 0408 05/15/18 0411 05/16/18 0509 05/17/18 0553 05/18/18 0616  INR 1.19 1.25 1.24 1.58 1.78     Radiology Studies: Ct Knee Right Wo Contrast  Result Date: 05/17/2018 CLINICAL DATA:  Status post fall.  Right knee pain. EXAM: CT OF THE RIGHT KNEE WITHOUT CONTRAST TECHNIQUE: Multidetector CT imaging of the RIGHT knee was performed according to the standard protocol. Multiplanar CT image reconstructions were also generated. COMPARISON:  None. FINDINGS: Bones/Joint/Cartilage Generalized osteopenia. No acute fracture or dislocation. Mild tricompartmental osteoarthritis of right knee. Chondrocalcinosis of the medial and lateral femorotibial compartments as can be seen with CPPD. Large high attenuation joint effusion consistent with hemarthrosis with a small amount air within the joint fluid concerning for septic arthritis in the absence of instrumentation. Ligaments Suboptimally assessed by CT. Muscles and Tendons Muscles are normal. No muscle atrophy. Quadriceps tendon and patellar tendon are intact. Soft tissues Soft tissue edema in the subcutaneous fat along the anterolateral knee. No focal fluid collection or hematoma. IMPRESSION: 1. Large high attenuation joint effusion consistent with hemarthrosis with a small amount air within the joint fluid concerning for septic arthritis in the absence of instrumentation. 2.  No acute osseous injury of the right knee. 3. Tricompartmental osteoarthritis of the right knee. Electronically Signed   By: Kathreen Devoid   On: 05/17/2018 20:05     Medications:  Scheduled: . [MAR Hold] amLODipine  10 mg Oral Daily  . [MAR Hold] aspirin  81 mg Oral Daily  . [MAR Hold] baclofen  5 mg Oral TID  . chlorhexidine  60 mL Topical Once  . [MAR Hold] docusate sodium  200 mg Oral BID  . [MAR Hold] donepezil  10 mg Oral QHS  . enoxaparin (LOVENOX) injection  130 mg Subcutaneous Q12H  . [MAR Hold] finasteride  5 mg Oral Daily  . [MAR Hold] gabapentin  400 mg Oral TID  . [MAR Hold] latanoprost  1 drop Both Eyes QHS  . [MAR Hold] lidocaine (PF)  5 mL Other Once  . [MAR  Hold] linaclotide  290 mcg Oral QAC breakfast  . [MAR Hold] lisinopril  5 mg Oral Daily  . [MAR Hold] Melatonin  1.5 mg Oral QHS  . [MAR Hold] memantine  10 mg Oral BID  . [MAR Hold] pantoprazole  40 mg Oral Daily  . [MAR Hold] polyethylene glycol  17 g Oral Daily  . [MAR Hold] potassium chloride SA  20 mEq Oral Daily  . povidone-iodine  2 application Topical Once  . [MAR Hold] pravastatin  10 mg Oral QPM  . [MAR Hold] rifampin  300 mg Oral Q12H  . [MAR Hold] sodium chloride flush  3 mL Intravenous Q12H  . [MAR Hold] traZODone  100 mg Oral QHS  . [MAR Hold] venlafaxine XR  150 mg Oral BID  . [MAR Hold] vitamin B-12  1,000 mcg Oral Daily  . warfarin  10 mg Oral ONCE-1800  . [MAR Hold] Warfarin - Pharmacist Dosing Inpatient   Does not apply q1800   Continuous: . [MAR Hold] sodium chloride 10 mL/hr at 05/15/18 1900  . acetaminophen    . [START ON  05/19/2018]  ceFAZolin (ANCEF) IV    . lactated ringers 10 mL/hr at 05/18/18 1156   PRN:[MAR Hold] sodium chloride, acetaminophen, [MAR Hold] diazepam, [MAR Hold] fluticasone, HYDROcodone-acetaminophen, HYDROmorphone (DILAUDID) injection, meperidine (DEMEROL) injection, [MAR Hold] oxyCODONE, promethazine, [MAR Hold] sodium chloride flush  Assessment/Plan:      Bilateral lower extremity weakness, falls -CT cervical, thoracic, lumbar spine 5/22 showed new endplate erosion G25-42 with a increased paravertebral inflammation, concern for septic arthritis -Myelogram completed on 5/24, reviewed by Dr. Ellene Route per neurosurgery, patient does not have evidence of canal compromise, Spinal cord compression epidural collections or neuro compromise from an anatomic basis. - Recommended neurology consult, seen by Dr. Orlena Sheldon, recommended PT OT evaluation for rehab -PT recommended inpatient rehab, evaluated by CIR but denied. Patient somewhat upset but agreeable to pursue other facilities.? -Added baclofen for right leg spasms  Bilateral knee  pain/effusion She was noted to have swelling involving both his knees especially the right.  Imaging studies done at the time of admission did not show any fractures.  He has not had any recent injuries since that time.  He was noted to have effusion on examination.  He was seen by orthopedics.  Underwent arthrocentesis of both his joints.  Significantly higher WBC (68500) noted in the synovial fluid from the right knee.  The fluid was also bloody from the right knee.  WBC was 9700 in the left knee.  Patient underwent CT scan of the right knee joint which raised concern for infection.  Discussed with orthopedics this morning.  Patient taken to the OR for washout.  Crystals also noted on synovial fluid analysis.  It remains unclear if there is truly an infection or not.  Cultures are pending.  Orthopedics recommends continued antibiotics until the cultures return.  They also recommend steroids and colchicine for which there is no clear contraindication.  They are okay with resuming anticoagulation today.   Epidural abscess/vertebral osteomyelitis/discitis -Blood cultures had grown MSSA previously,  - continue IV cefazolin, rifampin, tentative stop date on 05/22/2018.  However considering possible infection in the right knee, the course of treatment may have been extended.  ID has been consulted to assist with management. -Status post I&D and laminectomy on 4/9  Acute DVT right peroneal vein, left soleal vein -Patient started on IV heparin and warfarin.  INR 1.7 this morning.  Cleared to resume with anticoagulation by orthopedics.  We will transition him to Lovenox starting tonight.  Continue with warfarin.  Check CBC later today and tomorrow.    Essential hypertension Blood pressure is reasonably well controlled.  Continue amlodipine and lisinopril.  Chronic pain syndrome -Currently stable, continue gabapentin   Depression Continue home dose of venlafaxine  Hyperlipidemia Continue  statin  Coronary artery disease Currently no symptoms, no chest pain or shortness of breath  Cognitive impairment, possible underlying dementia Continue Aricept, Namenda  DVT Prophylaxis: Warfarin and Lovenox. Code Status: Full code Family Communication: Discussed with the patient Disposition Plan: Await ID input.  If he remain stable and once we have a definitive plan for his antibiotics, he should be able to go to either skilled nursing facility or an inpatient rehab facility.    LOS: 7 days   [facility] Pager [phone] 05/18/2018, 2:27 PM  If 7PM-7AM, please contact night-coverage at [email or web address], password [facility]

## 2018-05-18 NOTE — Progress Notes (Signed)
PT Cancellation Note  Patient Details Name: Cody Velez MRN: [id number] DOB: [birth year]   Cancelled Treatment:    Reason Eval/Treat Not Completed: Patient at procedure or test/unavailable( off floor in surgery for knee I&D)   [facility] 05/18/2018, 11:47 AM

## 2018-05-18 NOTE — Anesthesia Procedure Notes (Signed)
Procedure Name: LMA Insertion Date/Time: 05/18/2018 12:33 PM Performed by: Adalberto Ill, CRNA Pre-anesthesia Checklist: Patient identified, Emergency Drugs available, Suction available, Patient being monitored and Timeout performed Patient Re-evaluated:Patient Re-evaluated prior to induction Oxygen Delivery Method: Circle system utilized Preoxygenation: Pre-oxygenation with 100% oxygen Induction Type: IV induction Ventilation: Mask ventilation without difficulty LMA: LMA inserted LMA Size: 5.0 Number of attempts: 2 Placement Confirmation: ETT inserted through vocal cords under direct vision,  positive ETCO2 and breath sounds checked- equal and bilateral Secured at: 0 (yess) cm Tube secured with: Tape Dental Injury: Teeth and Oropharynx as per pre-operative assessment

## 2018-05-18 NOTE — Progress Notes (Signed)
ANTICOAGULATION CONSULT NOTE - Follow Up Consult  Pharmacy Consult for Heparin >> Lovenox + Warfarin Indication: DVT  No Known Allergies  Patient Measurements: Height: 6\' 3"  (190.5 cm) Weight: 282 lb 3 oz (128 kg) IBW/kg (Calculated) : 84.5 Heparin Dosing Weight:    Vital Signs: Temp: 98 F (36.7 C) (05/30 0736) Temp Source: Oral (05/30 0736) BP: 114/99 (05/30 0736) Pulse Rate: 72 (05/30 0736)  Labs: Recent Labs    05/16/18 0509 05/16/18 0511 05/17/18 0553 05/18/18 0616 05/18/18 0621  HGB 9.5*  --  9.4*  --   --   HCT 33.4*  --  32.9*  --   --   PLT 587*  --  625*  --   --   LABPROT 15.5*  --  18.8* 20.6*  --   INR 1.24  --  1.58 1.78  --   HEPARINUNFRC  --  0.37 0.41  --  0.33  CREATININE 0.81  --   --   --   --     Estimated Creatinine Clearance: 120.6 mL/min (by C-G formula based on SCr of 0.81 mg/dL).  Assessment: 72 YOM with known epidural abscess on Cefazolin + Rifampin who presented on 5/20 with frequent falls. The patient was on Warfarin for anticoagulation PTA for hx DVT. Doses were held and reversed for myelogram  - resumed post-op with Heparin bridge. Pharmacy consulted to dose.   Heparin level this morning was therapeutic (HL 0.33 << 0.41, goal of 0.3-0.7) before it was held for the patient to go to the OR for an I&D of the R-knee. Per discussion with MD - the plan is to transition the patient to lovenox this evening at 2100.  INR today remains SUBtherapeutic though trending up (INR 1.78 << 1.58). No CBC today.  Noted DDI with Rifampin and Warfarin - will monitor.   Goal of Therapy:  Heparin level 0.3-0.7 units/ml  INR 2-3 Monitor platelets by anticoagulation protocol: Yes   Plan:  - Heparin d/ced - Start Lovenox 130 mg SQ every 12 hours starting at 2100 this evening - Repeat Warfarin 10 mg x 1 dose at 1800 today - Will continue to monitor for any signs/symptoms of bleeding and will follow up with heparin level and PT/INR in the a.m.   Thank you  for allowing pharmacy to be a part of this patient's care.  Alycia Rossetti, PharmD, BCPS Clinical Pharmacist Pager: [phone] Clinical phone for 05/18/2018 from 7a-3:30p: [phone] If after 3:30p, please call main pharmacy at: [phone] 05/18/2018 11:35 AM

## 2018-05-18 NOTE — Anesthesia Postprocedure Evaluation (Signed)
Anesthesia Post Note  Patient: Cody Velez  Procedure(s) Performed: PARTIAL MEDIAL MENISECTOMY AND SURGICAL LAVAGE AND CHONDROPLASTY (Right Knee)     Patient location during evaluation: PACU Anesthesia Type: General Level of consciousness: awake and alert Pain management: pain level controlled Vital Signs Assessment: post-procedure vital signs reviewed and stable Respiratory status: spontaneous breathing, nonlabored ventilation, respiratory function stable and patient connected to nasal cannula oxygen Cardiovascular status: blood pressure returned to baseline and stable Postop Assessment: no apparent nausea or vomiting Anesthetic complications: no    Last Vitals:  Vitals:   05/18/18 1500 05/18/18 1610  BP:  116/76  Pulse: 83 88  Resp: 17 20  Temp:  36.6 C  SpO2: 99% 98%    Last Pain:  Vitals:   05/18/18 1610  TempSrc: Oral  PainSc:                  [place]

## 2018-05-18 NOTE — Progress Notes (Signed)
[facility] for Infectious Disease    Date of Admission:  05/08/2018   Total days of antibiotics   ID: Cody Velez is a 72 y.o. male  Principal Problem:   Falls frequently Active Problems:   Memory difficulty   Chronic pain syndrome   Bacteremia due to methicillin susceptible Staphylococcus aureus (MSSA)   Obesity   Staphylococcus aureus sepsis ([place])   Spinal cord stimulator status   Epidural abscess   Paraplegia (HCC)  Cody Velez is a 72 y.o. male with with history of cervical and lumbar surgery, cervical implanted spinal stimulator who had methicillin sensitive Staphylococcus aureus bacteremia and thoracic epidural abscess who underwent laminectomy/decompression with debridement of disc space at T10-T11and segmental fixation. He was discharged on 8 wk of IV cefazolin plus rifampin to take for 8 wk with anticipated prolonged oral abtx course given HW is in place.He recently released from SNF to home for a few days but having had  repeated falls sustaining effusions bilaterally to his knees. He has had both knees aspirated however the cell count of righ knee concerning for pseudogout but concern for infection since WBC count much more elevated than his other knee. (68K with 95% N vs. 9.7K 69%N)- CPP crystals seen in both but blood on the right. Dr Mardelle Matte took him to the OR for wash out, there was not any purulence but substantial hemorrhage/clot throughout, which would be perfect nutrient for bacteria. ID asked to weigh in on abtx and if his current abtx need to be extended. Synovial cultures are NGTD from 5/29 ( at day 1). He denies any fever, chills, nightsweats. Left knee feels good since receiving steroid injection. He has not had rash nor diarrhea since being on abtx  No Known Allergies   Medications:  . amLODipine  10 mg Oral Daily  . aspirin  81 mg Oral Daily  . baclofen  5 mg Oral TID  . colchicine  0.6 mg Oral Daily  . docusate sodium  200 mg Oral BID  .  donepezil  10 mg Oral QHS  . enoxaparin (LOVENOX) injection  130 mg Subcutaneous Q12H  . finasteride  5 mg Oral Daily  . gabapentin  400 mg Oral TID  . latanoprost  1 drop Both Eyes QHS  . lidocaine (PF)  5 mL Other Once  . linaclotide  290 mcg Oral QAC breakfast  . lisinopril  5 mg Oral Daily  . Melatonin  1.5 mg Oral QHS  . memantine  10 mg Oral BID  . pantoprazole  40 mg Oral Daily  . polyethylene glycol  17 g Oral Daily  . potassium chloride SA  20 mEq Oral Daily  . pravastatin  10 mg Oral QPM  . predniSONE  40 mg Oral Q breakfast  . rifampin  300 mg Oral Q12H  . sodium chloride flush  3 mL Intravenous Q12H  . traZODone  100 mg Oral QHS  . venlafaxine XR  150 mg Oral BID  . vitamin B-12  1,000 mcg Oral Daily  . warfarin  10 mg Oral ONCE-1800  . Warfarin - Pharmacist Dosing Inpatient   Does not apply q1800    reports that he quit smoking about 59 years ago. His smoking use included cigarettes. He has quit using smokeless tobacco. His smokeless tobacco use included chew. He reports that he drinks alcohol. He reports that he does not use drugs.   family history includes Asthma in his other; Breast cancer in his mother;  CAD in his father; Colon cancer in his mother; Dementia in his paternal uncle; Emphysema in his father and maternal grandmother; Heart attack in his brother; Heart disease in his paternal grandfather; Heart failure in his father; Lung cancer in his father; Stroke in his maternal grandmother, mother, and sister.    Review of Systems  Constitutional: Negative for fever, chills, diaphoresis, activity change, appetite change, fatigue and unexpected weight change.  HENT: Negative for congestion, sore throat, rhinorrhea, sneezing, trouble swallowing and sinus pressure.  Eyes: Negative for photophobia and visual disturbance.  Respiratory: Negative for cough, chest tightness, shortness of breath, wheezing and stridor.  Cardiovascular: Negative for chest pain, palpitations and  leg swelling.  Gastrointestinal: Negative for nausea, vomiting, abdominal pain, diarrhea, constipation, blood in stool, abdominal distention and anal bleeding.  Genitourinary: Negative for dysuria, hematuria, flank pain and difficulty urinating.  Musculoskeletal:+ back pain and bilateral knee pain Skin: Negative for color change, pallor, rash and wound.  Neurological: Negative for dizziness, tremors, weakness and light-headedness.  Hematological: Negative for adenopathy. Does not bruise/bleed easily.  Psychiatric/Behavioral: Negative for behavioral problems, confusion, sleep disturbance, dysphoric mood, decreased concentration and agitation.    Objective: Vital signs in last 24 hours: Temp:  [97.8 F (36.6 C)-98.7 F (37.1 C)] 97.9 F (36.6 C) (05/30 1610) Pulse Rate:  [72-90] 88 (05/30 1610) Resp:  [13-20] 20 (05/30 1610) BP: (114-148)/(76-99) 116/76 (05/30 1610) SpO2:  [92 %-100 %] 98 % (05/30 1610) Weight:  [282 lb 3 oz (128 kg)] 282 lb 3 oz (128 kg) (05/30 1142) Physical Exam  Constitutional: He is oriented to person, place, and time. He appears well-developed and well-nourished. No distress.  HENT:  Mouth/Throat: Oropharynx is clear and moist. No oropharyngeal exudate.  Cardiovascular: Normal rate, regular rhythm and normal heart sounds. Exam reveals no gallop and no friction rub.  No murmur heard.  Pulmonary/Chest: Effort normal and breath sounds normal. No respiratory distress. He has no wheezes.  Abdominal: Soft. Bowel sounds are normal. He exhibits no distension. There is no tenderness.  Lymphadenopathy:  He has no cervical adenopathy. Ext: RUE picc line is c/d/i. Small effusion to left knee. Not warm. Right leg wrapped from surgery  Neurological: He is alert and oriented to person, place, and time.  Skin: Skin is warm and dry. No rash noted. No erythema.  Psychiatric: He has a normal mood and affect. His behavior is normal.     Lab Results Recent Labs     05/16/18 0509 05/17/18 0553  WBC 8.8 8.7  HGB 9.5* 9.4*  HCT 33.4* 32.9*  NA 137  --   K 4.1  --   CL 102  --   CO2 27  --   BUN 7  --   CREATININE 0.81  --     Microbiology: 5/29 synovial fluid x 2 pending, NGTD at day 1 Studies/Results: Ct Knee Right Wo Contrast  Result Date: 05/17/2018 CLINICAL DATA:  Status post fall.  Right knee pain. EXAM: CT OF THE RIGHT KNEE WITHOUT CONTRAST TECHNIQUE: Multidetector CT imaging of the RIGHT knee was performed according to the standard protocol. Multiplanar CT image reconstructions were also generated. COMPARISON:  None. FINDINGS: Bones/Joint/Cartilage Generalized osteopenia. No acute fracture or dislocation. Mild tricompartmental osteoarthritis of right knee. Chondrocalcinosis of the medial and lateral femorotibial compartments as can be seen with CPPD. Large high attenuation joint effusion consistent with hemarthrosis with a small amount air within the joint fluid concerning for septic arthritis in the absence of instrumentation. Ligaments Suboptimally assessed  by CT. Muscles and Tendons Muscles are normal. No muscle atrophy. Quadriceps tendon and patellar tendon are intact. Soft tissues Soft tissue edema in the subcutaneous fat along the anterolateral knee. No focal fluid collection or hematoma. IMPRESSION: 1. Large high attenuation joint effusion consistent with hemarthrosis with a small amount air within the joint fluid concerning for septic arthritis in the absence of instrumentation. 2.  No acute osseous injury of the right knee. 3. Tricompartmental osteoarthritis of the right knee. Electronically Signed   By: Kathreen Devoid   On: 05/17/2018 20:05     Assessment/Plan:  72yo M with MSSA bacteremia, epidural abscess, with spinal HW on his 8th week of IV abtx with bilateral knee effusions thought to be pseudogout but right knee cell count also in the range c/w septic arthrtitis. Cultures are pending to see if any bacteria can be isolated. IT is  unclear if the hemorrhage in the joint alone would account for marked increased in cell count in comparison to his other knee sans hemorrhage.  He was to finish his IV course on June 3rd - but now may extend an addn 4 wk if cultures are positive. Will have home health  to extend his IV cefazolin until he sees infectious diseases in the next 1-2 wk. At that time will decide if need to also treat for septic arthritis (for addn 4 wk) though it appears more consistent with pseudogout at this time.  Will check sed rate and crp tomorrow   [facility] Cell: [phone] Pager: [phone]  05/18/2018, 4:37 PM

## 2018-05-18 NOTE — Op Note (Addendum)
05/18/2018  1:58 PM  PATIENT:  Cody Velez    PRE-OPERATIVE DIAGNOSIS:  Right knee effusion, pseudogout, possible sepsis  POST-OPERATIVE DIAGNOSIS:  Same  PROCEDURE:  RIGHT KNEE ARTHROSCOPY, PARTIAL MEDIAL MENISECTOMY AND SURGICAL LAVAGE AND CHONDROPLASTY  SURGEON:  Johnny Bridge, MD  PHYSICIAN ASSISTANT: Joya Gaskins, OPA-C, present and scrubbed throughout the case, critical for completion in a timely fashion, and for retraction, instrumentation, and closure.  ANESTHESIA:   General  PREOPERATIVE INDICATIONS:  Cody Velez is a  72 y.o. male who has had MSSA bacteremia after sepsis from a spine surgery.  He presented with increasing falls, swelling in both of his knees.  Aspiration of the left knee demonstrated relatively normal white blood cell count, with crystals consistent with pseudogout.  The right knee however had significant elevation in white blood cells, as well as substantial hemorrhage, and had also crystals with pseudogout.  He elected for surgical lavage, there was enough concern given the clinical presentation of bacteremia and sepsis that the right knee could have had infection, and therefore she elected for more aggressive intervention with surgical lavage and debridement.  The left knee however was improving with the Depo-Medrol injection, and he elected for nonsurgical management on that knee.  The risks benefits and alternatives were discussed with the patient preoperatively including but not limited to the risks of infection, bleeding, nerve injury, cardiopulmonary complications, the need for revision surgery, among others, and the patient was willing to proceed.  We also discussed the risks for progression of osteomyelitis, need for revision surgery, incomplete relief of pain, persistent gout, among others.  ESTIMATED BLOOD LOSS: Minimal  OPERATIVE IMPLANTS: None  OPERATIVE FINDINGS: There is extensive grade 4 changes with chondral loss in the  patellofemoral joint, and also extensive grade 3 changes in the medial femoral condyle grade 2 changes on the tibia, ACL had mucoid degeneration but was intact.  The lateral compartment had evidence for crystal deposition, and a fair amount of loose chondral material, but the cartilaginous surfaces had only grade 1 or grade 2 changes.  The lateral side looked reasonably good.  I did not find any gross purulence.  There was substantial hemorrhage and clot throughout the knee.  OPERATIVE PROCEDURE: The patient was brought to the operating room and placed in the supine position.  General anesthesia was administered.  The right lower extremity was prepped and draped in usual sterile fashion.  Timeout performed.  Diagnostic arthroscopy was carried out.  We already had cultures from the aspiration yesterday so I did not take further cultures.  Diagnostic arthroscopy demonstrated the above named findings.  I used the arthroscopic shaver to remove the extensive clot throughout all of the compartments.  I performed a chondroplasty of the patellofemoral joint as well as the medial patellofemoral joint and the medial femoral condyle.  The medial meniscus had a moderate amount of fraying centrally which was debrided.  The overall structure still looked reasonably good.  The ACL had mucoid degeneration and I debrided a hemorrhagic torn portion of it, but structurally it was still intact.  The lateral compartment had crystalline deposition which was debrided, no real meniscus tear, the cartilage still looked pretty good.  Medial lateral gutters were also debrided.  I irrigated a total of 9 L through the knee, and then remove the instruments, left the portals open to drain, dressed his knee with sterile gauze.  He was awakened and returned to the PACU in stable and satisfactory condition.  I had  a discussion with Dr. Curly Rim regarding the postoperative management, and we agreed that using prednisone would be in his best  interest for the management of his pseudogout, combined with colchicine, he can be weightbearing as tolerated on his lower extremities working with physical therapy, he will return to clinic with me in 1 week, and he can be anticoagulated as desired per the medical service, and they are planning on starting him on Lovenox.  He is likely to need IV antibiotics at least until the aspirate culture final results are in, and it may be reasonable to extend his IV antibiotics for a period of a couple of weeks given the concern about his right knee, I will defer to infectious disease regarding this, but it may be worthwhile monitoring the normalization of lab values (ESR/CRP) before stopping his antibiotics.

## 2018-05-18 NOTE — Progress Notes (Signed)
Pt assisted in placement of home CPAP for the night. Tolerating well.

## 2018-05-18 NOTE — Transfer of Care (Signed)
Immediate Anesthesia Transfer of Care Note  Patient: Cody Velez  Procedure(s) Performed: PARTIAL MEDIAL MENISECTOMY AND SURGICAL LAVAGE AND CHONDROPLASTY (Right Knee)  Patient Location: PACU  Anesthesia Type:General  Level of Consciousness: awake, alert  and oriented  Airway & Oxygen Therapy: Patient Spontanous Breathing and Patient connected to nasal cannula oxygen  Post-op Assessment: Report given to RN and Post -op Vital signs reviewed and stable  Post vital signs: Reviewed and stable  Last Vitals:  Vitals Value Taken Time  BP 127/80 05/18/2018  2:22 PM  Temp    Pulse 90 05/18/2018  2:22 PM  Resp 15 05/18/2018  2:22 PM  SpO2 96 % 05/18/2018  2:22 PM  Vitals shown include unvalidated device data.  Last Pain:  Vitals:   05/18/18 0800  TempSrc:   PainSc: 0-No pain      Patients Stated Pain Goal: 3 (30/09/23 3007)  Complications: No apparent anesthesia complications

## 2018-05-18 NOTE — Progress Notes (Signed)
OT Cancellation Note  Patient Details Name: Cody Velez MRN: [id number] DOB: 1946-01-29   Cancelled Treatment:    Reason Eval/Treat Not Completed: Patient at procedure or test/ unavailable(off the floor for sx). OT will continue to follow.   [place] 05/18/2018, 11:19 AM  Hulda Humphrey OTR/L [phone]

## 2018-05-18 NOTE — Progress Notes (Signed)
Patient seen and examined  Left knee is much improved with the injection.  He has 80 degrees of painless motion.  No erythema.  Clinically does not look infected.  Right knee still has significant effusion, with a painful arc and almost no motion.  Plan for right knee arthroscopy and irrigation and debridement with possible meniscectomy and chondroplasty depending on operative findings.  It will be difficult to determine if he in fact has an infection overlying a pseudogout attack on the right side, he definitely has pseudogout on the left.  The elevated white blood cell count in the right knee is concerning enough to warrant surgical debridement, the risks benefits and alternatives and postoperative recovery course of been outlined in detail and all questions were answered.  We discussed the risks for persistent infection, the potential need for left knee washout, the progression to osteomyelitis and significant degenerative changes, a revision surgery, bleeding, nerve injury, anesthetic complications, among others.  The patient has also agreed that his left knee is getting better and does not feel like he needs surgical intervention on that side.  Cody Bridge, MD

## 2018-05-19 ENCOUNTER — Encounter (HOSPITAL_COMMUNITY): Payer: Self-pay | Admitting: Orthopedic Surgery

## 2018-05-19 LAB — CBC
HCT: 31.5 % — ABNORMAL LOW (ref 39.0–52.0)
Hemoglobin: 8.9 g/dL — ABNORMAL LOW (ref 13.0–17.0)
MCH: 23.3 pg — ABNORMAL LOW (ref 26.0–34.0)
MCHC: 28.3 g/dL — ABNORMAL LOW (ref 30.0–36.0)
MCV: 82.5 fL (ref 78.0–100.0)
Platelets: 594 10*3/uL — ABNORMAL HIGH (ref 150–400)
RBC: 3.82 MIL/uL — ABNORMAL LOW (ref 4.22–5.81)
RDW: 16.9 % — ABNORMAL HIGH (ref 11.5–15.5)
WBC: 8.3 10*3/uL (ref 4.0–10.5)

## 2018-05-19 LAB — PROTIME-INR
INR: 2.26
Prothrombin Time: 24.8 seconds — ABNORMAL HIGH (ref 11.4–15.2)

## 2018-05-19 LAB — BASIC METABOLIC PANEL
Anion gap: 5 (ref 5–15)
BUN: 10 mg/dL (ref 6–20)
CO2: 27 mmol/L (ref 22–32)
Calcium: 8.6 mg/dL — ABNORMAL LOW (ref 8.9–10.3)
Chloride: 104 mmol/L (ref 101–111)
Creatinine, Ser: 0.83 mg/dL (ref 0.61–1.24)
GFR calc Af Amer: >60 mL/min (ref >60)
GFR calc non Af Amer: >60 mL/min (ref >60)
Glucose, Bld: 124 mg/dL — ABNORMAL HIGH (ref 65–99)
Potassium: 4.8 mmol/L (ref 3.5–5.1)
Sodium: 136 mmol/L (ref 135–145)

## 2018-05-19 MED ORDER — WARFARIN SODIUM 7.5 MG PO TABS
7.5000 mg | ORAL_TABLET | Freq: Once | ORAL | Status: AC
  Administered 2018-05-19: 7.5 mg via ORAL
  Filled 2018-05-19: qty 1

## 2018-05-19 MED ORDER — RIFAMPIN 300 MG PO CAPS
300.0000 mg | ORAL_CAPSULE | Freq: Two times a day (BID) | ORAL | Status: DC
  Administered 2018-05-19 – 2018-05-22 (×7): 300 mg via ORAL
  Filled 2018-05-19 (×7): qty 1

## 2018-05-19 MED ORDER — ENOXAPARIN SODIUM 150 MG/ML ~~LOC~~ SOLN
130.0000 mg | Freq: Two times a day (BID) | SUBCUTANEOUS | Status: DC
  Administered 2018-05-19 – 2018-05-22 (×6): 130 mg via SUBCUTANEOUS
  Filled 2018-05-19 (×6): qty 0.87

## 2018-05-19 MED ORDER — CEFAZOLIN SODIUM-DEXTROSE 2-4 GM/100ML-% IV SOLN
2.0000 g | Freq: Three times a day (TID) | INTRAVENOUS | Status: DC
  Administered 2018-05-19 – 2018-05-22 (×9): 2 g via INTRAVENOUS
  Filled 2018-05-19 (×10): qty 100

## 2018-05-19 NOTE — Progress Notes (Signed)
**Cody Cody** Cody Cody  [name] ATF:[id number] DOB: 08-26-1946 DOA: 05/08/2018  PCP: Asencion Noble, MD  Brief History/Interval Summary: 72 year old male with hypertension, hyperlipidemia, morbid obesity, CAD, chronic back pain status post spinal stimulator(C-spine) placed 1/30/19presented withincreasing mechanical falls at home. Patient was recently discharged from [place] a stay from 03/30/2018 through 05/06/2018. The patient was initially admitted in early April 2019, found to have MSSA bacteremia. Extensive work-up at that time revealed epidural abscess and T10-11 osteomyelitis and discitis, underwent laminectomy, decompression of epidural abscess, and methacrylate screw augmentation on 03/28/2018 performed by Dr. Ellene Route.The patient was followed by infectious disease. They recommended cefazolin and rifampin for 8 weeks to be completed on 05/22/2018. He was subsequently discharged to inpatient rehabilitation on 03/30/2018. Patient was making significant progress at that time. However since discharge home, the patient has noted increasing lower extremity weakness and increasing falling.  Patient was hospitalized.  Seen by neurosurgery.  Was waiting on placement to skilled nursing facility.  He was noted to have swelling of his right knee.  Orthopedics was consulted.  There was concern for pseudogout versus infection.  Hemarthrosis was also noted.  He was taken to the OR for washout.  Consultants:  Dr. Ellene Route Orthopedics  Procedures:  Myelogram  RIGHT KNEE ARTHROSCOPY, PARTIAL MEDIAL MENISECTOMY AND SURGICAL LAVAGE AND CHONDROPLASTY   Antibiotics: Cefazolin and rifampin  Subjective/Interval History: Patient states that his right knee feels much better.  Not as painful as before.  Denies any new complaints.     ROS: Denies any shortness of breath.  Objective:  Vital Signs  Vitals:   05/18/18 2349 05/19/18 0408 05/19/18 0736 05/19/18 1207  BP: 116/79 109/71 129/75  112/81  Pulse: 74 70 72 82  Resp: 18 18 16 20   Temp: 97.8 F (36.6 C) 98 F (36.7 C) 97.9 F (36.6 C) 97.7 F (36.5 C)  TempSrc: Oral Oral Oral Oral  SpO2: 98% 96% 94% 97%  Weight:  128.5 kg (283 lb 4.7 oz)    Height:        Intake/Output Summary (Last 24 hours) at 05/19/2018 1241 Last data filed at 05/19/2018 0127 Gross per 24 hour  Intake 700 ml  Output 702 ml  Net -2 ml   Filed Weights   05/18/18 0308 05/18/18 1142 05/19/18 0408  Weight: 128 kg (282 lb 3 oz) 128 kg (282 lb 3 oz) 128.5 kg (283 lb 4.7 oz)    General appearance: Awake alert.  In no distress. Resp: Clear to auscultation bilaterally.  No wheezing rales rhonchi Cardio: S1-S2 normal regular.  No S3-S4.  No rubs murmurs or bruit GI: Abdomen is soft.  Nontender nondistended.  Bowel sounds are present.  No masses organomegaly Extremities: Right lower extremity covered in dressing.  Good range of motion of the left knee joint. Neurologic: No focal neurological deficits.  Lab Results:  Data Reviewed: I have personally reviewed following labs and imaging studies  CBC: Recent Labs  Lab 05/15/18 0411 05/16/18 0509 05/17/18 0553 05/18/18 1721 05/19/18 0502  WBC 7.3 8.8 8.7 8.3 8.3  HGB 9.1* 9.5* 9.4* 9.4* 8.9*  HCT 32.2* 33.4* 32.9* 33.3* 31.5*  MCV 81.9 83.1 81.4 82.4 82.5  PLT 578* 587* 625* 588* 594*    Basic Metabolic Panel: Recent Labs  Lab 05/13/18 0236 05/16/18 0509 05/19/18 0502  NA 138 137 136  K 3.7 4.1 4.8  CL 105 102 104  CO2 27 27 27   GLUCOSE 126* 89 124*  BUN 6 7 10   CREATININE  0.77 0.81 0.83  CALCIUM 8.7* 8.8* 8.6*    GFR: Estimated Creatinine Clearance: 117.9 mL/min (by C-G formula based on SCr of 0.83 mg/dL).  Coagulation Profile: Recent Labs  Lab 05/15/18 0411 05/16/18 0509 05/17/18 0553 05/18/18 0616 05/19/18 0502  INR 1.25 1.24 1.58 1.78 2.26    Radiology Studies: Ct Knee Right Wo Contrast  Result Date: 05/17/2018 CLINICAL DATA:  Status post fall.  Right knee  pain. EXAM: CT OF THE RIGHT KNEE WITHOUT CONTRAST TECHNIQUE: Multidetector CT imaging of the RIGHT knee was performed according to the standard protocol. Multiplanar CT image reconstructions were also generated. COMPARISON:  None. FINDINGS: Bones/Joint/Cartilage Generalized osteopenia. No acute fracture or dislocation. Mild tricompartmental osteoarthritis of right knee. Chondrocalcinosis of the medial and lateral femorotibial compartments as can be seen with CPPD. Large high attenuation joint effusion consistent with hemarthrosis with a small amount air within the joint fluid concerning for septic arthritis in the absence of instrumentation. Ligaments Suboptimally assessed by CT. Muscles and Tendons Muscles are normal. No muscle atrophy. Quadriceps tendon and patellar tendon are intact. Soft tissues Soft tissue edema in the subcutaneous fat along the anterolateral knee. No focal fluid collection or hematoma. IMPRESSION: 1. Large high attenuation joint effusion consistent with hemarthrosis with a small amount air within the joint fluid concerning for septic arthritis in the absence of instrumentation. 2.  No acute osseous injury of the right knee. 3. Tricompartmental osteoarthritis of the right knee. Electronically Signed   By: Kathreen Devoid   On: 05/17/2018 20:05     Medications:  Scheduled: . amLODipine  10 mg Oral Daily  . aspirin  81 mg Oral Daily  . baclofen  5 mg Oral TID  . colchicine  0.6 mg Oral Daily  . docusate sodium  200 mg Oral BID  . donepezil  10 mg Oral QHS  . enoxaparin (LOVENOX) injection  130 mg Subcutaneous Q12H  . finasteride  5 mg Oral Daily  . gabapentin  400 mg Oral TID  . latanoprost  1 drop Both Eyes QHS  . lidocaine (PF)  5 mL Other Once  . linaclotide  290 mcg Oral QAC breakfast  . lisinopril  5 mg Oral Daily  . Melatonin  1.5 mg Oral QHS  . memantine  10 mg Oral BID  . pantoprazole  40 mg Oral Daily  . polyethylene glycol  17 g Oral Daily  . potassium chloride SA   20 mEq Oral Daily  . pravastatin  10 mg Oral QPM  . predniSONE  40 mg Oral Q breakfast  . rifampin  300 mg Oral Q12H  . sodium chloride flush  3 mL Intravenous Q12H  . traZODone  100 mg Oral QHS  . venlafaxine XR  150 mg Oral BID  . vitamin B-12  1,000 mcg Oral Daily  . warfarin  7.5 mg Oral ONCE-1800  . Warfarin - Pharmacist Dosing Inpatient   Does not apply q1800   Continuous: . sodium chloride 10 mL/hr at 05/15/18 1900  .  ceFAZolin (ANCEF) IV    . lactated ringers 10 mL/hr at 05/18/18 1156   XTG:GYIRSW chloride, diazepam, fluticasone, oxyCODONE, sodium chloride flush    Assessment/Plan:  Bilateral lower extremity weakness, falls -CT cervical, thoracic, lumbar spine 5/22 showed new endplate erosion N46-27 with a increased paravertebral inflammation, concern for septic arthritis -Myelogram completed on 5/24, reviewed by Dr. Ellene Route per neurosurgery, patient does not have evidence of canal compromise, Spinal cord compression epidural collections or neuro compromise from an anatomic  basis. - Recommended neurology consult, seen by Dr. Orlena Sheldon, recommended PT OT evaluation for rehab -PT recommended inpatient rehab, evaluated by CIR but denied. Patient somewhat upset but agreeable to pursue other facilities.  Possible placement to [facility] inpatient rehab.  ? -Added baclofen for right leg spasms Seems to be stable.  Bilateral knee pain/effusion Patient was noted to have swelling involving both his knees especially the right.  Imaging studies done at the time of admission did not show any fractures.  He was noted to have effusion on examination.  He was seen by orthopedics.  Underwent arthrocentesis of both his joints.  Significantly higher WBC (68500) noted in the synovial fluid from the right knee.  The fluid was also bloody from the right knee.  WBC was 9700 in the left knee.  Patient underwent CT scan of the right knee joint which raised concern for infection.  Patient was taken to the  operating room for washout.  Crystals were also noted.  Cultures have been sent and are pending.  It is quite possible that the knee effusion was due to a combination of pseudogout and osteoarthritis.  Unable to rule out infection.  Continue antibiotics for now.  Patient also started on prednisone and colchicine.  Prednisone will be tapered down over the next 6 to 7 days.   Epidural abscess/vertebral osteomyelitis/discitis -Blood cultures had grown MSSA previously,  -She has been on cefazolin and rifampin with a tentative stop date of 05/22/2018.  However due to possibility of infection of the right knee this may have to be extended.  Infectious disease was consulted.  They are also planning to extend treatment till patient follows-up with their clinic.  They will determine new stop date at that time.  -Status post I&D and laminectomy on 4/9  Acute DVT right peroneal vein, left soleal vein -This was diagnosed during his previous hospitalization.  Patient was on warfarin.  Warfarin was held.  INR drifted down.  He was then placed back on heparin infusion and warfarin.  INR is therapeutic.  Lovenox for 1 more day.  Slightly low hemoglobin noted today.  Will be repeated tomorrow.     Essential hypertension Blood pressure is reasonably well controlled.  Continue amlodipine and lisinopril.  Chronic pain syndrome -Currently stable, continue gabapentin   Depression Continue home dose of venlafaxine  Hyperlipidemia Continue statin  Coronary artery disease Currently no symptoms, no chest pain or shortness of breath  Cognitive impairment, possible underlying dementia Continue Aricept, Namenda  DVT Prophylaxis: Warfarin and Lovenox. Code Status: Full code Family Communication: Discussed with the patient Disposition Plan: Patient is medically stable.  Waiting on placement.  Possible discharge to [facility] inpatient rehab when bed is available.    LOS: 8 days   [place]  [facility] Pager [phone] 05/19/2018, 12:41 PM  If 7PM-7AM, please contact night-coverage at [email or web address], password [email or web address]

## 2018-05-19 NOTE — Plan of Care (Signed)
  Problem: Clinical Measurements: Goal: Will remain free from infection Outcome: Progressing Goal: Diagnostic test results will improve Outcome: Progressing Goal: Respiratory complications will improve Outcome: Progressing Goal: Cardiovascular complication will be avoided Outcome: Progressing   Problem: Activity: Goal: Risk for activity intolerance will decrease Outcome: Progressing   Problem: Safety: Goal: Ability to remain free from injury will improve Outcome: Progressing

## 2018-05-19 NOTE — Progress Notes (Signed)
Occupational Therapy Treatment Patient Details Name: Cody Velez MRN: [id number] DOB: 1946/11/01 Today's Date: 05/19/2018    History of present illness Pt is a 72 y.o. male with medical history significant of obstructive sleep apnea, hypertension, obesity, chronic back pain with multiple 3 previous surgeries who recently had an epidural abscess causing paraplegia surgically decompressed on March 28, 2018 has been on 8 weeks duration of cefazolin and rifampin until May 22, 2018.  Patient has been in short-term rehab and was just discharged this past Saturday.  He went home and since being home he is fallen several times.  Family brought him back in due to his frequent falls and the need for further rehab versus skilled nursing facility placement. Patient is being admitted again further evaluation for more rehabilitation. Per Neurology - consistent with a slowly recovering myelopathy patient. 05/18/17 s/p partial medial menisectomy and surgical lavage and chondroplasty (right).   OT comments  Pt progressing towards OT goals this session. Pt able to don back brace with mod A sitting EOB (directing OT when he could not perform) and max A for LB donning of L knee hinged brace. Pt able to recall all back precautions and maintain during bed mobility.Pt continues to require significant level of assist (max A +2) for sit <>stand transfers and min A for lateral scoot to wheelchair. Pt is able to perform sink level grooming from wc with set up. Pt is very motivated to continue to improve and maximize safety and independence. Continue to recommend comprehensive inpatient rehab.   Follow Up Recommendations  CIR;Supervision/Assistance - 24 hour    Equipment Recommendations  Hospital bed    Recommendations for Other Services      Precautions / Restrictions Precautions Precautions: Fall;Back Precaution Booklet Issued: No Precaution Comments: Pt able to recall 3/3 precautions Required Braces or Orthoses:  Spinal Brace;Other Brace/Splint Spinal Brace: Thoracolumbosacral orthotic Other Brace/Splint: patient has bilateral hinged knee braces in room Restrictions Weight Bearing Restrictions: Yes RLE Weight Bearing: Weight bearing as tolerated LLE Weight Bearing: Weight bearing as tolerated       Mobility Bed Mobility Overal bed mobility: Needs Assistance Bed Mobility: Rolling;Sidelying to Sit Rolling: Min assist Sidelying to sit: Mod assist       General bed mobility comments: Mod assist to elevated trunk in sidelying; good movement of BLE to edge of bed  Transfers Overall transfer level: Needs assistance Equipment used: Rolling walker (2 wheeled) Transfers: Sit to/from Stand;Lateral/Scoot Transfers Sit to Stand: Max assist;+2 physical assistance;From elevated surface        Lateral/Scoot Transfers: Min assist(to manage RLE) General transfer comment: patient able to achieve standing on 3rd attempt with two person maximal assistance. cueing for rocking forward to gain momentum and anterior shift to progress to standing. Right knee blocked due to moderate buckling. Able to maintain balance for ~60 seconds in order to move bed closer to allow for safe transition from stand to sit. Min guard for lateral scoot from bed to w/c with cueing for foot positioning.     Balance Overall balance assessment: Needs assistance Sitting-balance support: Feet supported Sitting balance-Leahy Scale: Good     Standing balance support: During functional activity;Bilateral upper extremity supported Standing balance-Leahy Scale: Poor Standing balance comment: relies on external support                           ADL either performed or assessed with clinical judgement   ADL Overall ADL's : Needs assistance/impaired  Grooming: Wash/dry face;Oral care;Set up;Sitting Grooming Details (indicate cue type and reason): sink level in wheel chair, spit cups provided                  Toilet Transfer: Min guard;BSC;Requires Estate agent Details (indicate cue type and reason): lateral scoot, assist with managing RLE due to recent sx "I can't feel where it is" Toileting- Water quality scientist and Hygiene: Total assistance;Sit to/from stand;+2 for safety/equipment Toileting - Clothing Manipulation Details (indicate cue type and reason): Pt wears adult diapers     Functional mobility during ADLs: Maximal assistance;+2 for physical assistance;+2 for safety/equipment(HEAVY support on the right (lock out knee)) General ADL Comments: Pt required 3 attempts to stand with + 2 assist, was able to scoot over to w/c from sitting EOB     Vision       Perception     Praxis      Cognition Arousal/Alertness: Awake/alert Behavior During Therapy: WFL for tasks assessed/performed Overall Cognitive Status: Within Functional Limits for tasks assessed                                          Exercises     Shoulder Instructions       General Comments      Pertinent Vitals/ Pain       Pain Assessment: Faces Faces Pain Scale: Hurts even more Pain Location: right knee pain with standing Pain Descriptors / Indicators: Grimacing;Guarding Pain Intervention(s): Monitored during session;Repositioned;Limited activity within patient's tolerance  Home Living                                          Prior Functioning/Environment              Frequency  Min 2X/week        Progress Toward Goals  OT Goals(current goals can now be found in the care plan section)  Progress towards OT goals: Progressing toward goals  Acute Rehab OT Goals Patient Stated Goal: to walk OT Goal Formulation: With patient Time For Goal Achievement: 05/28/18 Potential to Achieve Goals: Good  Plan Discharge plan remains appropriate    Co-evaluation    PT/OT/SLP Co-Evaluation/Treatment: Yes Reason for Co-Treatment: For patient/therapist  safety;To address functional/ADL transfers;Other (comment)(first session post knee sx) PT goals addressed during session: Mobility/safety with mobility;Balance;Proper use of DME;Strengthening/ROM OT goals addressed during session: ADL's and self-care;Proper use of Adaptive equipment and DME      AM-PAC PT "6 Clicks" Daily Activity     Outcome Measure   Help from another person eating meals?: None Help from another person taking care of personal grooming?: A Little Help from another person toileting, which includes using toliet, bedpan, or urinal?: A Lot Help from another person bathing (including washing, rinsing, drying)?: A Lot Help from another person to put on and taking off regular upper body clothing?: A Little Help from another person to put on and taking off regular lower body clothing?: A Lot 6 Click Score: 16    End of Session Equipment Utilized During Treatment: Gait belt;Rolling walker;Other (comment)(WC; L hinged knee brace)  OT Visit Diagnosis: Muscle weakness (generalized) (M62.81);Other abnormalities of gait and mobility (R26.89)   Activity Tolerance Patient tolerated treatment well   Patient Left Other (comment)(in WC at sink)  Nurse Communication Mobility status;Other (comment)(in WC at sink, transfer recommendations)        Time: 1015-1056 OT Time Calculation (min): 41 min  Charges: OT General Charges $OT Visit: 1 Visit OT Treatments $Self Care/Home Management : 8-22 mins $Therapeutic Activity: 8-22 mins  Hulda Humphrey OTR/L [place] 05/19/2018, 3:56 PM

## 2018-05-19 NOTE — Progress Notes (Signed)
[facility]  Per Jacqualin Combes, RN, Case Manager pt to DC to [facility] inpatient rehab. [facility] hospital Infusion Coordinator/inpatient team will follow while he is there to support DC back home as ordered.  If patient discharges after hours, please call [phone].   [name] 05/19/2018, 11:57 AM

## 2018-05-19 NOTE — Progress Notes (Signed)
ANTICOAGULATION CONSULT NOTE - Follow Up Consult  Pharmacy Consult for Heparin >> Lovenox + Warfarin Indication: DVT  No Known Allergies  Patient Measurements: Height: 6\' 3"  (190.5 cm) Weight: 283 lb 4.7 oz (128.5 kg) IBW/kg (Calculated) : 84.5 Heparin Dosing Weight:    Vital Signs: Temp: 97.9 F (36.6 C) (05/31 0736) Temp Source: Oral (05/31 0736) BP: 129/75 (05/31 0736) Pulse Rate: 72 (05/31 0736)  Labs: Recent Labs    05/17/18 0553 05/18/18 0616 05/18/18 0621 05/18/18 1721 05/19/18 0502  HGB 9.4*  --   --  9.4* 8.9*  HCT 32.9*  --   --  33.3* 31.5*  PLT 625*  --   --  588* 594*  LABPROT 18.8* 20.6*  --   --  24.8*  INR 1.58 1.78  --   --  2.26  HEPARINUNFRC 0.41  --  0.33  --   --   CREATININE  --   --   --   --  0.83    Estimated Creatinine Clearance: 117.9 mL/min (by C-G formula based on SCr of 0.83 mg/dL).  Assessment: 8 YOM with known epidural abscess on Cefazolin + Rifampin who presented on 5/20 with frequent falls. The patient was on Warfarin for anticoagulation PTA for hx DVT then with new acute DVT noted 5/6. Warfarin doses were held and reversed for myelogram  - resumed post-op with Heparin bridge. The patient underwent I&D by ortho on 5/30 with Heparin stopped again - post-op bridge resumed with Lovenox. Pharmacy consulted to dose.   The patient's INR this morning is therapeutic (INR 2.26 << 1.78, goal of 2-3). Hgb/Hct slight drop, plts wnl. Since the patient had a new acute DVT noted on 5/6 - per discussion with MD, will continue Lovenox bridge for 24 hours to ensure INR remains >2. Renal function stable, lovenox dose remains appropriate.   Noted DDI with Rifampin and Warfarin - will monitor.   Goal of Therapy:  Heparin level 0.3-0.7 units/ml  INR 2-3 Monitor platelets by anticoagulation protocol: Yes   Plan:  - Continue Lovenox 130 mg SQ every 12 hours - Will plan to d/c Enox bridge if INR remains >2 on 6/1 - Warfarin 7.5 mg x 1 dose at 1800  today - Will continue to monitor for any signs/symptoms of bleeding and will follow up with heparin level and PT/INR in the a.m.   Thank you for allowing pharmacy to be a part of this patient's care.  Alycia Rossetti, PharmD, BCPS Clinical Pharmacist Pager: [phone] Clinical phone for 05/19/2018 from 7a-3:30p: [phone] If after 3:30p, please call main pharmacy at: [phone] 05/19/2018 9:55 AM

## 2018-05-19 NOTE — Progress Notes (Addendum)
Physical Therapy Treatment Patient Details Name: Cody Velez MRN: [id number] DOB: November 06, 1946 Today's Date: 05/19/2018    History of Present Illness Pt is a 72 y.o. male with medical history significant of obstructive sleep apnea, hypertension, obesity, chronic back pain with multiple 3 previous surgeries who recently had an epidural abscess causing paraplegia surgically decompressed on March 28, 2018 has been on 8 weeks duration of cefazolin and rifampin until May 22, 2018.  Patient has been in short-term rehab and was just discharged this past Saturday.  He went home and since being home he is fallen several times.  Family brought him back in due to his frequent falls and the need for further rehab versus skilled nursing facility placement. Patient is being admitted again further evaluation for more rehabilitation. Per Neurology - consistent with a slowly recovering myelopathy patient. 05/18/17 s/p partial medial menisectomy and surgical lavage and chondroplasty (right).    PT Comments    Plan for d/c to [facility] per chart review. Patient continues to be limited with right knee pain. Requiring two person maximal assistance and right knee block to progress to limited standing; patient presenting with strong knee buckle. Able to transfer via lateral scooting from bed to chair with increased independence and better pain control. Patient does wish to continue to practice standing, he states his ultimate goal is to be able to walk again.    Follow Up Recommendations  CIR     Equipment Recommendations  None recommended by PT    Recommendations for Other Services       Precautions / Restrictions Precautions Precautions: Fall;Back Precaution Booklet Issued: No Precaution Comments: min cues for log roll technique Required Braces or Orthoses: Spinal Brace;Other Brace/Splint Spinal Brace: Thoracolumbosacral orthotic Other Brace/Splint: patient has bilateral hinged knee braces in  room Restrictions Weight Bearing Restrictions: Yes RLE Weight Bearing: Weight bearing as tolerated LLE Weight Bearing: Weight bearing as tolerated    Mobility  Bed Mobility Overal bed mobility: Needs Assistance Bed Mobility: Rolling;Sidelying to Sit Rolling: Min assist Sidelying to sit: Mod assist       General bed mobility comments: Mod assist to elevated trunk in sidelying; good movement of BLE to edge of bed  Transfers Overall transfer level: Needs assistance Equipment used: Rolling walker (2 wheeled) Transfers: Sit to/from Stand;Lateral/Scoot Transfers Sit to Stand: Max assist;+2 physical assistance;From elevated surface        Lateral/Scoot Transfers: Min guard General transfer comment: patient able to achieve standing on 3rd attempt with two person maximal assistance. cueing for rocking forward to gain momentum and anterior shift to progress to standing. Right knee blocked due to moderate buckling. Able to maintain balance for ~60 seconds in order to move bed closer to allow for safe transition from stand to sit. Min guard for lateral scoot from bed to w/c with cueing for foot positioning.   Ambulation/Gait                 Stairs             Wheelchair Mobility    Modified Rankin (Stroke Patients Only)       Balance Overall balance assessment: Needs assistance Sitting-balance support: Feet supported Sitting balance-Leahy Scale: Good     Standing balance support: During functional activity;Bilateral upper extremity supported Standing balance-Leahy Scale: Poor Standing balance comment: relies on external support  Cognition Arousal/Alertness: Awake/alert Behavior During Therapy: WFL for tasks assessed/performed Overall Cognitive Status: Within Functional Limits for tasks assessed                                        Exercises      General Comments        Pertinent Vitals/Pain Pain  Assessment: Faces Faces Pain Scale: Hurts even more Pain Location: right knee pain with standing Pain Descriptors / Indicators: Grimacing;Guarding Pain Intervention(s): Monitored during session;Repositioned    Home Living                      Prior Function            PT Goals (current goals can now be found in the care plan section) Acute Rehab PT Goals Patient Stated Goal: to walk Potential to Achieve Goals: Good Progress towards PT goals: Progressing toward goals    Frequency    Min 2X/week      PT Plan Current plan remains appropriate    Co-evaluation PT/OT/SLP Co-Evaluation/Treatment: Yes Reason for Co-Treatment: For patient/therapist safety;To address functional/ADL transfers PT goals addressed during session: Mobility/safety with mobility        AM-PAC PT "6 Clicks" Daily Activity  Outcome Measure  Difficulty turning over in bed (including adjusting bedclothes, sheets and blankets)?: Unable Difficulty moving from lying on back to sitting on the side of the bed? : Unable Difficulty sitting down on and standing up from a chair with arms (e.g., wheelchair, bedside commode, etc,.)?: Unable Help needed moving to and from a bed to chair (including a wheelchair)?: A Little Help needed walking in hospital room?: Total Help needed climbing 3-5 steps with a railing? : Total 6 Click Score: 8    End of Session Equipment Utilized During Treatment: Gait belt;Back brace;Other (comment)(left hinged knee brace) Activity Tolerance: Patient tolerated treatment well Patient left: Other (comment);with call bell/phone within reach(in wheelchair) Nurse Communication: Mobility status PT Visit Diagnosis: Unsteadiness on feet (R26.81);Other abnormalities of gait and mobility (R26.89);Muscle weakness (generalized) (M62.81)     Time: 3149-7026 PT Time Calculation (min) (ACUTE ONLY): 34 min  Charges:  $Therapeutic Activity: 8-22 mins                    G Codes:       Ellamae Sia, PT, DPT Acute Rehabilitation Services  Pager: [phone]   Willy Eddy 05/19/2018, 1:06 PM

## 2018-05-19 NOTE — Progress Notes (Signed)
Patient ID: Cody Velez, male   DOB: October 13, 1946, 72 y.o.   MRN: [id number]     Subjective:  Patient reports pain as mild to moderate.  Patient report great improvment  Objective:   VITALS:   Vitals:   05/18/18 1947 05/18/18 2349 05/19/18 0408 05/19/18 0736  BP: 110/72 116/79 109/71 129/75  Pulse: 71 74 70 72  Resp: 18 18 18 16   Temp: 98 F (36.7 C) 97.8 F (36.6 C) 98 F (36.7 C) 97.9 F (36.6 C)  TempSrc: Oral Oral Oral Oral  SpO2: 96% 98% 96% 94%  Weight:   128.5 kg (283 lb 4.7 oz)   Height:        ABD soft Sensation intact distally Dorsiflexion/Plantar flexion intact Incision: dressing C/D/I and no drainage   Lab Results  Component Value Date   WBC 8.3 05/19/2018   HGB 8.9 (L) 05/19/2018   HCT 31.5 (L) 05/19/2018   MCV 82.5 05/19/2018   PLT 594 (H) 05/19/2018   BMET    Component Value Date/Time   NA 136 05/19/2018 0502   K 4.8 05/19/2018 0502   CL 104 05/19/2018 0502   CO2 27 05/19/2018 0502   GLUCOSE 124 (H) 05/19/2018 0502   BUN 10 05/19/2018 0502   CREATININE 0.83 05/19/2018 0502   CALCIUM 8.6 (L) 05/19/2018 0502   GFRNONAA >60 05/19/2018 0502   GFRAA >60 05/19/2018 0502     Assessment/Plan: 1 Day Post-Op   Principal Problem:   Falls frequently Active Problems:   Memory difficulty   Chronic pain syndrome   Bacteremia due to methicillin susceptible Staphylococcus aureus (MSSA)   Obesity   Staphylococcus aureus sepsis (HCC)   Spinal cord stimulator status   Epidural abscess   Paraplegia (HCC)   Advance diet Up with therapy Continue plan per medicine WBAT Dry dressing PRN Will continue to monitor      Remonia Richter 05/19/2018, 8:47 AM   [name], MD Cell [phone]

## 2018-05-19 NOTE — Progress Notes (Signed)
ID PROGRESS NOTE  Synovial fluid cx remain NGTD at 48hrs  A/p:   Would plan on extend his IV cefazolin for addn 4 wk to cover presumed septic arthritis of the knee for now. New abtx end date would 6/30.  Continue with oral rifampin 300mg  po bid  Please check weekly cbc with diff, cmp when discharged  We will arrange for him to be seen in 2 wk where we can evaluate how he is improving.  Elzie Rings [place] for Infectious Diseases [phone]

## 2018-05-20 DIAGNOSIS — M11262 Other chondrocalcinosis, left knee: Secondary | ICD-10-CM

## 2018-05-20 DIAGNOSIS — M11261 Other chondrocalcinosis, right knee: Secondary | ICD-10-CM

## 2018-05-20 LAB — BASIC METABOLIC PANEL
Anion gap: 6 (ref 5–15)
BUN: 10 mg/dL (ref 6–20)
CO2: 28 mmol/L (ref 22–32)
Calcium: 8.8 mg/dL — ABNORMAL LOW (ref 8.9–10.3)
Chloride: 103 mmol/L (ref 101–111)
Creatinine, Ser: 0.79 mg/dL (ref 0.61–1.24)
GFR calc Af Amer: >60 mL/min (ref >60)
GFR calc non Af Amer: >60 mL/min (ref >60)
Glucose, Bld: 94 mg/dL (ref 65–99)
Potassium: 3.9 mmol/L (ref 3.5–5.1)
Sodium: 137 mmol/L (ref 135–145)

## 2018-05-20 LAB — HEPATIC FUNCTION PANEL
ALT: 10 U/L — ABNORMAL LOW (ref 17–63)
AST: 17 U/L (ref 15–41)
Albumin: 2.2 g/dL — ABNORMAL LOW (ref 3.5–5.0)
Alkaline Phosphatase: 81 U/L (ref 38–126)
Bilirubin, Direct: <0.1 mg/dL — ABNORMAL LOW (ref 0.1–0.5)
Total Bilirubin: 0.4 mg/dL (ref 0.3–1.2)
Total Protein: 7.3 g/dL (ref 6.5–8.1)

## 2018-05-20 LAB — PROTIME-INR
INR: 1.96
Prothrombin Time: 22.2 seconds — ABNORMAL HIGH (ref 11.4–15.2)

## 2018-05-20 LAB — CBC
HCT: 32 % — ABNORMAL LOW (ref 39.0–52.0)
Hemoglobin: 9 g/dL — ABNORMAL LOW (ref 13.0–17.0)
MCH: 23.1 pg — ABNORMAL LOW (ref 26.0–34.0)
MCHC: 28.1 g/dL — ABNORMAL LOW (ref 30.0–36.0)
MCV: 82.1 fL (ref 78.0–100.0)
Platelets: 637 10*3/uL — ABNORMAL HIGH (ref 150–400)
RBC: 3.9 MIL/uL — ABNORMAL LOW (ref 4.22–5.81)
RDW: 16.9 % — ABNORMAL HIGH (ref 11.5–15.5)
WBC: 7.2 10*3/uL (ref 4.0–10.5)

## 2018-05-20 MED ORDER — WARFARIN SODIUM 5 MG PO TABS
10.0000 mg | ORAL_TABLET | Freq: Once | ORAL | Status: AC
  Administered 2018-05-20: 10 mg via ORAL
  Filled 2018-05-20: qty 2

## 2018-05-20 MED ORDER — PREDNISONE 20 MG PO TABS
20.0000 mg | ORAL_TABLET | Freq: Every day | ORAL | Status: DC
  Administered 2018-05-21 – 2018-05-22 (×2): 20 mg via ORAL
  Filled 2018-05-20 (×2): qty 1

## 2018-05-20 NOTE — Progress Notes (Signed)
ANTICOAGULATION CONSULT NOTE - Follow Up Consult  Pharmacy Consult for Lovenox + Warfarin Indication: DVT  No Known Allergies  Patient Measurements: Height: 6\' 3"  (190.5 cm) Weight: 283 lb 4.7 oz (128.5 kg) IBW/kg (Calculated) : 84.5  Vital Signs: Temp: 98.6 F (37 C) (06/01 0845) Temp Source: Oral (06/01 0845) BP: 124/83 (06/01 0845) Pulse Rate: 79 (06/01 0845)  Labs: Recent Labs    05/18/18 0616 05/18/18 0621  05/18/18 1721 05/19/18 0502 05/20/18 0402  HGB  --   --    < > 9.4* 8.9* 9.0*  HCT  --   --   --  33.3* 31.5* 32.0*  PLT  --   --   --  588* 594* 637*  LABPROT 20.6*  --   --   --  24.8* 22.2*  INR 1.78  --   --   --  2.26 1.96  HEPARINUNFRC  --  0.33  --   --   --   --   CREATININE  --   --   --   --  0.83 0.79   < > = values in this interval not displayed.    Estimated Creatinine Clearance: 122.3 mL/min (by C-G formula based on SCr of 0.79 mg/dL).  Assessment: 73 YOM with known epidural abscess on Cefazolin + Rifampin who presented on 5/20 with frequent falls. The patient was on Warfarin for anticoagulation PTA for hx DVT then with new acute DVT noted 5/6. Warfarin doses were held and reversed for myelogram  - resumed post-op with Heparin bridge. The patient underwent I&D by ortho on 5/30 with Heparin stopped again - post-op bridge resumed with Lovenox.   INR this morning is below goal at 1.96, since INR did not remain >2 will continue Lovenox for at least one more day. Renal function stable, Lovenox dose remains appropriate.  Hgb stable, plts remain elevated- no bleeding noted.  Noted DDI with Rifampin and Warfarin - will monitor.   Goal of Therapy:  Heparin level 0.3-0.7 units/ml  INR 2-3 Monitor platelets by anticoagulation protocol: Yes   Plan:  Continue Lovenox 130 mg SQ q12h Will plan to d/c Lovenox if INR >2 on 6/2 Warfarin 10mg  x 1 tonight  Daily INR and CBC  Thank you for allowing pharmacy to be a part of this patient's care.  Lazariah Savard D.  Flo Berroa, PharmD, BCPS Clinical Pharmacist [phone] Please check AMION for all [place] numbers 05/20/2018 10:22 AM

## 2018-05-20 NOTE — Progress Notes (Signed)
[facility] for Infectious Disease    Date of Admission:  05/08/2018     Cody Velez is a 72 y.o. male with   Principal Problem:   Falls frequently Active Problems:   Memory difficulty   Chronic pain syndrome   Bacteremia due to methicillin susceptible Staphylococcus aureus (MSSA)   Obesity   Staphylococcus aureus sepsis ([place])   Spinal cord stimulator status   Epidural abscess   Paraplegia (HCC)    Subjective: Afebrile, feeling better, less knee pain  Medications:  . amLODipine  10 mg Oral Daily  . aspirin  81 mg Oral Daily  . baclofen  5 mg Oral TID  . colchicine  0.6 mg Oral Daily  . docusate sodium  200 mg Oral BID  . donepezil  10 mg Oral QHS  . enoxaparin (LOVENOX) injection  130 mg Subcutaneous Q12H  . finasteride  5 mg Oral Daily  . gabapentin  400 mg Oral TID  . latanoprost  1 drop Both Eyes QHS  . lidocaine (PF)  5 mL Other Once  . linaclotide  290 mcg Oral QAC breakfast  . lisinopril  5 mg Oral Daily  . Melatonin  1.5 mg Oral QHS  . memantine  10 mg Oral BID  . pantoprazole  40 mg Oral Daily  . polyethylene glycol  17 g Oral Daily  . potassium chloride SA  20 mEq Oral Daily  . pravastatin  10 mg Oral QPM  . [START ON 05/21/2018] predniSONE  20 mg Oral Q breakfast  . rifampin  300 mg Oral Q12H  . sodium chloride flush  3 mL Intravenous Q12H  . traZODone  100 mg Oral QHS  . venlafaxine XR  150 mg Oral BID  . vitamin B-12  1,000 mcg Oral Daily  . warfarin  10 mg Oral ONCE-1800  . Warfarin - Pharmacist Dosing Inpatient   Does not apply q1800    Objective: Vital signs in last 24 hours: Temp:  [98.1 F (36.7 C)-98.6 F (37 C)] 98.3 F (36.8 C) (06/01 1246) Pulse Rate:  [73-98] 98 (06/01 1246) Resp:  [17-18] 17 (06/01 1246) BP: (108-136)/(55-83) 118/83 (06/01 1246) SpO2:  [95 %-98 %] 95 % (06/01 1246) Physical Exam  Constitutional: He is oriented to person, place, and time. He appears well-developed and well-nourished. No distress.    HENT:  Mouth/Throat: Oropharynx is clear and moist. No oropharyngeal exudate.  Cardiovascular: Normal rate, regular rhythm and normal heart sounds. Exam reveals no gallop and no friction rub.  No murmur heard.  Pulmonary/Chest: Effort normal and breath sounds normal. No respiratory distress. He has no wheezes.  Abdominal: Soft. Bowel sounds are normal. He exhibits no distension. There is no tenderness.  Ext: right leg wrapped from surgery, and left knee no swelling or erythema. Neurological: He is alert and oriented to person, place, and time.  Skin: Skin is warm and dry. No rash noted. No erythema.  Psychiatric: He has a normal mood and affect. His behavior is normal.     Lab Results Recent Labs    05/19/18 0502 05/20/18 0402  WBC 8.3 7.2  HGB 8.9* 9.0*  HCT 31.5* 32.0*  NA 136 137  K 4.8 3.9  CL 104 103  CO2 27 28  BUN 10 10  CREATININE 0.83 0.79   Lab Results  Component Value Date   ESRSEDRATE 125 (H) 05/11/2018    Microbiology: 5/29 synovial fluid = NGTD   Assessment/Plan: Pseudogout bilateral knees +/- concern  for septic arthritis to right knee = Would plan on extend his IV cefazolin for addn 4 wk to cover presumed septic arthritis of the knee for now. New abtx end date would 6/30.  Continue with oral rifampin 300mg  po bid  Please check weekly cbc with diff, cmp when discharged  Hx of MSSA epidural abscess /discitis/hw infection = he will be transitioned to cephalexin plus rifampin after he finishes his IV treatment  Therapeutic drug monitoring = will add hepatic panel to his bmp today, needs weekly monitoring of LFTs since on rifampin  We will arrange for him to be seen in 2 wk where we can evaluate how he is improving.  [facility] for Infectious Diseases Cell: [phone] Pager: [phone]  05/20/2018, 2:31 PM

## 2018-05-20 NOTE — Care Management (Signed)
6/1 09:25- Spoke with Liaison, Darlyne Russian for [facility] [phone]). Updated therapy and progress notes requested and faxed to [phone].  Luellen Pucker, RN [phone]) states bed will not be available until Monday.

## 2018-05-20 NOTE — Progress Notes (Signed)
Patient ID: Cody Velez, male   DOB: Jul 16, 1946, 72 y.o.   MRN: [id number]     Subjective:  Patient reports pain as mild.  Patient in bed and in no acute distress  Objective:   VITALS:   Vitals:   05/19/18 2045 05/20/18 0045 05/20/18 0459 05/20/18 0845  BP: 108/67 (!) 109/55 136/77 124/83  Pulse: 84 78 73 79  Resp: 18 18 18 17   Temp: 98.2 F (36.8 C) 98.1 F (36.7 C) 98.1 F (36.7 C) 98.6 F (37 C)  TempSrc: Oral Oral Oral Oral  SpO2: 98% 95% 95% 97%  Weight:      Height:        ABD soft Sensation intact distally Dorsiflexion/Plantar flexion intact Incision: dressing C/D/I and no drainage   Lab Results  Component Value Date   WBC 7.2 05/20/2018   HGB 9.0 (L) 05/20/2018   HCT 32.0 (L) 05/20/2018   MCV 82.1 05/20/2018   PLT 637 (H) 05/20/2018   BMET    Component Value Date/Time   NA 137 05/20/2018 0402   K 3.9 05/20/2018 0402   CL 103 05/20/2018 0402   CO2 28 05/20/2018 0402   GLUCOSE 94 05/20/2018 0402   BUN 10 05/20/2018 0402   CREATININE 0.79 05/20/2018 0402   CALCIUM 8.8 (L) 05/20/2018 0402   GFRNONAA >60 05/20/2018 0402   GFRAA >60 05/20/2018 0402     Assessment/Plan: 2 Days Post-Op   Principal Problem:   Falls frequently Active Problems:   Memory difficulty   Chronic pain syndrome   Bacteremia due to methicillin susceptible Staphylococcus aureus (MSSA)   Obesity   Staphylococcus aureus sepsis (HCC)   Spinal cord stimulator status   Epidural abscess   Paraplegia (HCC)   Advance diet Up with therapy WBAT Dry dressing PRN Continue plan per Medicine Continue plan per ID      Remonia Richter 05/20/2018, 9:36 AM   [name], MD Cell [phone]

## 2018-05-20 NOTE — Progress Notes (Signed)
Pt wears Home CPAP.

## 2018-05-20 NOTE — Progress Notes (Signed)
Pt has home CPAP unit, does not need assistance

## 2018-05-20 NOTE — Progress Notes (Signed)
[facility] PROGRESS NOTE  JACOBE STUDY DVV:[id number] DOB: 23-Nov-1946 DOA: 05/08/2018  PCP: Asencion Noble, MD  Brief History/Interval Summary: 72 year old male with hypertension, hyperlipidemia, morbid obesity, CAD, chronic back pain status post spinal stimulator(C-spine) placed 1/30/19presented withincreasing mechanical falls at home. Patient was recently discharged from [place] a stay from 03/30/2018 through 05/06/2018. The patient was initially admitted in early April 2019, found to have MSSA bacteremia. Extensive work-up at that time revealed epidural abscess and T10-11 osteomyelitis and discitis, underwent laminectomy, decompression of epidural abscess, and methacrylate screw augmentation on 03/28/2018 performed by Dr. Ellene Route.The patient was followed by infectious disease. They recommended cefazolin and rifampin for 8 weeks to be completed on 05/22/2018. He was subsequently discharged to inpatient rehabilitation on 03/30/2018. Patient was making significant progress at that time. However since discharge home, the patient has noted increasing lower extremity weakness and increasing falling.  Patient was hospitalized.  Seen by neurosurgery.  Was waiting on placement to skilled nursing facility.  He was noted to have swelling of his right knee.  Orthopedics was consulted.  There was concern for pseudogout versus infection.  Hemarthrosis was also noted.  He was taken to the OR for washout.  Consultants:  Dr. Ellene Route [facility]  Procedures:  Myelogram  RIGHT KNEE ARTHROSCOPY, PARTIAL MEDIAL MENISECTOMY AND SURGICAL LAVAGE AND CHONDROPLASTY   Antibiotics: Cefazolin and rifampin  Subjective/Interval History: Patient denies any complaints.  Very minimal pain in the right knee.  Able to move his left knee without any problems.  He tells me that he was able to bear weight on the right side.  Denies any bleeding.  ROS: Denies any shortness of breath.  Objective:  Vital  Signs  Vitals:   05/19/18 2045 05/20/18 0045 05/20/18 0459 05/20/18 0845  BP: 108/67 (!) 109/55 136/77 124/83  Pulse: 84 78 73 79  Resp: 18 18 18 17   Temp: 98.2 F (36.8 C) 98.1 F (36.7 C) 98.1 F (36.7 C) 98.6 F (37 C)  TempSrc: Oral Oral Oral Oral  SpO2: 98% 95% 95% 97%  Weight:      Height:        Intake/Output Summary (Last 24 hours) at 05/20/2018 1204 Last data filed at 05/20/2018 0843 Gross per 24 hour  Intake 220 ml  Output 660 ml  Net -440 ml   Filed Weights   05/18/18 0308 05/18/18 1142 05/19/18 0408  Weight: 128 kg (282 lb 3 oz) 128 kg (282 lb 3 oz) 128.5 kg (283 lb 4.7 oz)    General appearance: Awake alert.  In no distress. Resp: Clear to auscultation bilaterally.  No wheezing rales or rhonchi. Cardio: S1-S2 is normal regular.  No S3-S4.  No rubs murmurs or bruit. GI: Abdomen soft.  Nontender nondistended.  Bowel sounds are present.  No masses or organomegaly Extremities: Right lower extremity covered in dressing.  Good range of motion of the left knee joint. Neurologic: No focal deficits.  Lab Results:  Data Reviewed: I have personally reviewed following labs and imaging studies  CBC: Recent Labs  Lab 05/16/18 0509 05/17/18 0553 05/18/18 1721 05/19/18 0502 05/20/18 0402  WBC 8.8 8.7 8.3 8.3 7.2  HGB 9.5* 9.4* 9.4* 8.9* 9.0*  HCT 33.4* 32.9* 33.3* 31.5* 32.0*  MCV 83.1 81.4 82.4 82.5 82.1  PLT 587* 625* 588* 594* 637*    Basic Metabolic Panel: Recent Labs  Lab 05/16/18 0509 05/19/18 0502 05/20/18 0402  NA 137 136 137  K 4.1 4.8 3.9  CL 102 104 103  CO2  27 27 28   GLUCOSE 89 124* 94  BUN 7 10 10   CREATININE 0.81 0.83 0.79  CALCIUM 8.8* 8.6* 8.8*    GFR: Estimated Creatinine Clearance: 122.3 mL/min (by C-G formula based on SCr of 0.79 mg/dL).  Coagulation Profile: Recent Labs  Lab 05/16/18 0509 05/17/18 0553 05/18/18 0616 05/19/18 0502 05/20/18 0402  INR 1.24 1.58 1.78 2.26 1.96    Radiology Studies: No results  found.   Medications:  Scheduled: . amLODipine  10 mg Oral Daily  . aspirin  81 mg Oral Daily  . baclofen  5 mg Oral TID  . colchicine  0.6 mg Oral Daily  . docusate sodium  200 mg Oral BID  . donepezil  10 mg Oral QHS  . enoxaparin (LOVENOX) injection  130 mg Subcutaneous Q12H  . finasteride  5 mg Oral Daily  . gabapentin  400 mg Oral TID  . latanoprost  1 drop Both Eyes QHS  . lidocaine (PF)  5 mL Other Once  . linaclotide  290 mcg Oral QAC breakfast  . lisinopril  5 mg Oral Daily  . Melatonin  1.5 mg Oral QHS  . memantine  10 mg Oral BID  . pantoprazole  40 mg Oral Daily  . polyethylene glycol  17 g Oral Daily  . potassium chloride SA  20 mEq Oral Daily  . pravastatin  10 mg Oral QPM  . predniSONE  40 mg Oral Q breakfast  . rifampin  300 mg Oral Q12H  . sodium chloride flush  3 mL Intravenous Q12H  . traZODone  100 mg Oral QHS  . venlafaxine XR  150 mg Oral BID  . vitamin B-12  1,000 mcg Oral Daily  . warfarin  10 mg Oral ONCE-1800  . Warfarin - Pharmacist Dosing Inpatient   Does not apply q1800   Continuous: . sodium chloride 250 mL (05/20/18 0046)  .  ceFAZolin (ANCEF) IV Stopped (05/20/18 0847)  . lactated ringers 10 mL/hr at 05/18/18 1156   HER:DEYCXK chloride, diazepam, fluticasone, oxyCODONE, sodium chloride flush    Assessment/Plan:  Bilateral lower extremity weakness, falls -CT cervical, thoracic, lumbar spine 5/22 showed new endplate erosion G81-85 with a increased paravertebral inflammation, concern for septic arthritis -Myelogram completed on 5/24, reviewed by Dr. Ellene Route per neurosurgery, patient does not have evidence of canal compromise, Spinal cord compression epidural collections or neuro compromise from an anatomic basis. - Recommended neurology consult, seen by Dr. Orlena Sheldon, recommended PT OT evaluation for rehab -PT recommended inpatient rehab, evaluated by CIR but denied. Patient somewhat upset but agreeable to pursue other facilities.  Possible  placement to [facility] inpatient rehab.  ? -baclofen for right leg spasms Seems to be stable.  Bilateral knee pain/hemarthrosis/pseudogout Patient was noted to have swelling involving both his knees especially the right.  Imaging studies done at the time of admission did not show any fractures.  He was noted to have effusion on examination.  He was seen by orthopedics.  Underwent arthrocentesis of both his joints.  Significantly higher WBC (68500) noted in the synovial fluid from the right knee.  The fluid was also bloody from the right knee.  WBC was 9700 in the left knee.  Patient underwent CT scan of the right knee joint which raised concern for infection.  Patient was taken to the operating room for washout.  Crystals were also noted.  Cultures have been sent and are pending.  It is quite possible that the knee effusion was due to a combination of  pseudogout and osteoarthritis.  Unable to rule out infection.  Continue antibiotics for now.  Patient also started on prednisone and colchicine.  Prednisone will be tapered down over the next 6 to 7 days.   Epidural abscess/vertebral osteomyelitis/discitis -Blood cultures had grown MSSA previously, Status post I&D and laminectomy on 4/9 -Patient has been on cefazolin and rifampin with a tentative stop date of 05/22/2018.  However due to possibility of infection of the right knee infectious disease was consulted.  For now treatment to be extended to June 30.  They will follow the patient in the clinic.    Acute DVT right peroneal vein, left soleal vein -This was diagnosed during his previous hospitalization.  Patient was on warfarin.  Warfarin was held.  INR drifted down.  He was then placed back on heparin infusion and warfarin.  Mild drop in INR noted this morning.  Continue Lovenox for now.  Higher dose of warfarin to be given by pharmacy.  Repeat INR tomorrow.  Hopefully will be able to take him off of Lovenox tomorrow.     Normocytic anemia Hemoglobin  has been stable for the last many days.  Continue to monitor.  Essential hypertension Blood pressure is reasonably well controlled.  Continue amlodipine and lisinopril.  Chronic pain syndrome -Currently stable, continue gabapentin   Depression Continue home dose of venlafaxine  Hyperlipidemia Continue statin  Coronary artery disease Currently no symptoms, no chest pain or shortness of breath  Cognitive impairment, possible underlying dementia Continue Aricept, Namenda  DVT Prophylaxis: Warfarin and Lovenox. Code Status: Full code Family Communication: Discussed with the patient. Disposition Plan: Patient remains medically stable.  Waiting on placement.    LOS: 9 days   [place] [facility] Pager [phone] 05/20/2018, 12:04 PM  If 7PM-7AM, please contact night-coverage at [email or web address], password [facility]

## 2018-05-21 DIAGNOSIS — M009 Pyogenic arthritis, unspecified: Secondary | ICD-10-CM

## 2018-05-21 DIAGNOSIS — M11269 Other chondrocalcinosis, unspecified knee: Secondary | ICD-10-CM

## 2018-05-21 LAB — BODY FLUID CULTURE
Culture: NO GROWTH
Culture: NO GROWTH

## 2018-05-21 LAB — PROTIME-INR
INR: 1.49
Prothrombin Time: 17.9 seconds — ABNORMAL HIGH (ref 11.4–15.2)

## 2018-05-21 MED ORDER — WARFARIN SODIUM 7.5 MG PO TABS
15.0000 mg | ORAL_TABLET | Freq: Once | ORAL | Status: AC
  Administered 2018-05-21: 15 mg via ORAL
  Filled 2018-05-21: qty 2

## 2018-05-21 NOTE — Progress Notes (Signed)
ANTICOAGULATION CONSULT NOTE - Follow Up Consult  Pharmacy Consult for Lovenox + Warfarin Indication: DVT  No Known Allergies  Patient Measurements: Height: 6\' 3"  (190.5 cm) Weight: 284 lb 2.8 oz (128.9 kg) IBW/kg (Calculated) : 84.5  Vital Signs: Temp: 98.1 F (36.7 C) (06/02 0853) Temp Source: Oral (06/02 0853) BP: 136/88 (06/02 0853) Pulse Rate: 79 (06/02 0853)  Labs: Recent Labs    05/18/18 1721 05/19/18 0502 05/20/18 0402 05/21/18 0552  HGB 9.4* 8.9* 9.0*  --   HCT 33.3* 31.5* 32.0*  --   PLT 588* 594* 637*  --   LABPROT  --  24.8* 22.2* 17.9*  INR  --  2.26 1.96 1.49  CREATININE  --  0.83 0.79  --     Estimated Creatinine Clearance: 122.5 mL/min (by C-G formula based on SCr of 0.79 mg/dL).  Assessment: 69 YOM with known epidural abscess on Cefazolin + Rifampin who presented on 5/20 with frequent falls. The patient was on Warfarin for anticoagulation PTA for hx DVT then with new acute DVT noted 5/6. Warfarin doses were held and reversed for myelogram  - resumed post-op with Heparin bridge. The patient underwent I&D by ortho on 5/30 with Heparin stopped again - post-op bridge resumed with Lovenox.   INR dropped further this morning to 1.49-  seem to be seeing the effects of the interaction with warfarin + rifampin now as INR is dropping (rifampin induces the metabolism of warfarin which results in a lower INR).   No bleeding noted.   Goal of Therapy:  Heparin level 0.3-0.7 units/ml  INR 2-3 Monitor platelets by anticoagulation protocol: Yes   Plan:  Continue Lovenox 130 mg SQ q12h Recommend continuing until INR is >2 for 24h Warfarin 15mg  x 1 tonight  Daily INR and CBC  Thank you for allowing pharmacy to be a part of this patient's care.  Wilda Wetherell D. Kynleigh Artz, PharmD, BCPS Clinical Pharmacist [phone] Please check AMION for all [place] numbers 05/21/2018 10:44 AM

## 2018-05-21 NOTE — Progress Notes (Signed)
CM spoke to Cody Velez at [facility] and they will have a bed for Cody Velez tomorrow 6/3. CM will need to call [place] and arrange the timing of his arrival to [place].  Cody Velez: [phone]

## 2018-05-21 NOTE — Progress Notes (Signed)
[facility] PROGRESS NOTE  Cody Velez VHQ:[id number] DOB: 12/08/1946 DOA: 05/08/2018  PCP: Asencion Noble, MD  Brief History/Interval Summary: 72 year old male with hypertension, hyperlipidemia, morbid obesity, CAD, chronic back pain status post spinal stimulator(C-spine) placed 1/30/19presented withincreasing mechanical falls at home. Patient was recently discharged from [place] a stay from 03/30/2018 through 05/06/2018. The patient was initially admitted in early April 2019, found to have MSSA bacteremia. Extensive work-up at that time revealed epidural abscess and T10-11 osteomyelitis and discitis, underwent laminectomy, decompression of epidural abscess, and methacrylate screw augmentation on 03/28/2018 performed by Dr. Ellene Route.The patient was followed by infectious disease. They recommended cefazolin and rifampin for 8 weeks to be completed on 05/22/2018. He was subsequently discharged to inpatient rehabilitation on 03/30/2018. Patient was making significant progress at that time. However since discharge home, the patient has noted increasing lower extremity weakness and increasing falling.  Patient was hospitalized.  Seen by neurosurgery.  Was waiting on placement to skilled nursing facility.  He was noted to have swelling of his right knee.  Orthopedics was consulted.  There was concern for pseudogout versus infection.  Hemarthrosis was also noted.  He was taken to the OR for washout.  Consultants:  Dr. Ellene Route Orthopedics  Procedures:  Myelogram  RIGHT KNEE ARTHROSCOPY, PARTIAL MEDIAL MENISECTOMY AND SURGICAL LAVAGE AND CHONDROPLASTY   Antibiotics: Cefazolin and rifampin  Subjective/Interval History: Patient feels well.  Pain in the right knee is manageable.  Denies any new complaints.  No bleeding.    ROS: Denies any shortness of breath..  Objective:  Vital Signs  Vitals:   05/20/18 2347 05/21/18 0359 05/21/18 0853 05/21/18 1216  BP: 137/75 (!) 147/80 136/88  123/73  Pulse: 72 78 79 93  Resp: 18 18 18 16   Temp: 98 F (36.7 C) 98 F (36.7 C) 98.1 F (36.7 C) 98 F (36.7 C)  TempSrc: Oral Oral Oral Oral  SpO2: 95% 96% 96% 100%  Weight:  128.9 kg (284 lb 2.8 oz)    Height:        Intake/Output Summary (Last 24 hours) at 05/21/2018 1231 Last data filed at 05/20/2018 2347 Gross per 24 hour  Intake -  Output 450 ml  Net -450 ml   Filed Weights   05/18/18 1142 05/19/18 0408 05/21/18 0359  Weight: 128 kg (282 lb 3 oz) 128.5 kg (283 lb 4.7 oz) 128.9 kg (284 lb 2.8 oz)    General appearance: Awake alert.  In no distress Resp: Clear to auscultation bilaterally.  No wheezing rales or rhonchi. Cardio: S1-S2 is normal regular.  No S3-S4.  No rubs murmurs or bruit GI: Abdomen remains soft.  Nontender nondistended.  Bowel sounds are present.  No masses organomegaly Extremities: Right lower extremity covered in dressing.  Good range of motion of the left knee joint. Neurologic: No focal deficits.  Lab Results:  Data Reviewed: I have personally reviewed following labs and imaging studies  CBC: Recent Labs  Lab 05/16/18 0509 05/17/18 0553 05/18/18 1721 05/19/18 0502 05/20/18 0402  WBC 8.8 8.7 8.3 8.3 7.2  HGB 9.5* 9.4* 9.4* 8.9* 9.0*  HCT 33.4* 32.9* 33.3* 31.5* 32.0*  MCV 83.1 81.4 82.4 82.5 82.1  PLT 587* 625* 588* 594* 637*    Basic Metabolic Panel: Recent Labs  Lab 05/16/18 0509 05/19/18 0502 05/20/18 0402  NA 137 136 137  K 4.1 4.8 3.9  CL 102 104 103  CO2 27 27 28   GLUCOSE 89 124* 94  BUN 7 10 10   CREATININE 0.81  0.83 0.79  CALCIUM 8.8* 8.6* 8.8*    GFR: Estimated Creatinine Clearance: 122.5 mL/min (by C-G formula based on SCr of 0.79 mg/dL).  Coagulation Profile: Recent Labs  Lab 05/17/18 0553 05/18/18 0616 05/19/18 0502 05/20/18 0402 05/21/18 0552  INR 1.58 1.78 2.26 1.96 1.49    Radiology Studies: No results found.   Medications:  Scheduled: . amLODipine  10 mg Oral Daily  . aspirin  81 mg Oral  Daily  . baclofen  5 mg Oral TID  . colchicine  0.6 mg Oral Daily  . docusate sodium  200 mg Oral BID  . donepezil  10 mg Oral QHS  . enoxaparin (LOVENOX) injection  130 mg Subcutaneous Q12H  . finasteride  5 mg Oral Daily  . gabapentin  400 mg Oral TID  . latanoprost  1 drop Both Eyes QHS  . lidocaine (PF)  5 mL Other Once  . linaclotide  290 mcg Oral QAC breakfast  . lisinopril  5 mg Oral Daily  . Melatonin  1.5 mg Oral QHS  . memantine  10 mg Oral BID  . pantoprazole  40 mg Oral Daily  . polyethylene glycol  17 g Oral Daily  . potassium chloride SA  20 mEq Oral Daily  . pravastatin  10 mg Oral QPM  . predniSONE  20 mg Oral Q breakfast  . rifampin  300 mg Oral Q12H  . sodium chloride flush  3 mL Intravenous Q12H  . traZODone  100 mg Oral QHS  . venlafaxine XR  150 mg Oral BID  . vitamin B-12  1,000 mcg Oral Daily  . warfarin  15 mg Oral ONCE-1800  . Warfarin - Pharmacist Dosing Inpatient   Does not apply q1800   Continuous: . sodium chloride 250 mL (05/20/18 0046)  .  ceFAZolin (ANCEF) IV Stopped (05/21/18 1040)  . lactated ringers 10 mL/hr at 05/18/18 1156   DDU:KGURKY chloride, diazepam, fluticasone, oxyCODONE, sodium chloride flush    Assessment/Plan:  Bilateral lower extremity weakness, falls -CT cervical, thoracic, lumbar spine 5/22 showed new endplate erosion H06-23 with a increased paravertebral inflammation, concern for septic arthritis -Myelogram completed on 5/24, reviewed by Dr. Ellene Route per neurosurgery, patient does not have evidence of canal compromise, Spinal cord compression epidural collections or neuro compromise from an anatomic basis. - Recommended neurology consult, seen by Dr. Orlena Sheldon, recommended PT OT evaluation for rehab -PT recommended inpatient rehab, evaluated by CIR but denied. Patient somewhat upset but agreeable to pursue other facilities.  Possible placement to [facility] inpatient rehab.  ? -baclofen for right leg spasms These issues appear  to be stable.  Continue PT OT.  Bilateral knee pain/hemarthrosis/pseudogout Patient was noted to have swelling involving both his knees especially the right.  Imaging studies done at the time of admission did not show any fractures.  He was noted to have effusion on examination.  He was seen by orthopedics.  Underwent arthrocentesis of both his joints.  Significantly higher WBC (68500) noted in the synovial fluid from the right knee.  The fluid was also bloody from the right knee.  WBC was 9700 in the left knee.  Patient underwent CT scan of the right knee joint which raised concern for infection.  Patient was taken to the operating room for washout.  Crystals were also noted.  Cultures have been sent and are pending.  It is quite possible that the knee effusion was due to a combination of pseudogout and osteoarthritis.  Unable to rule out infection.  Continue antibiotics for now.  Patient also started on prednisone and colchicine.  Prednisone will be tapered down over the next 3-4 days.   Epidural abscess/vertebral osteomyelitis/discitis -Blood cultures had grown MSSA previously, Status post I&D and laminectomy on 4/9 -Patient has been on cefazolin and rifampin with a tentative stop date of 05/22/2018.  However due to possibility of infection of the right knee infectious disease was consulted.  Per infectious disease new end date will be 06/18/2018.  Patient will need weekly CBC with differential and complete metabolic panel.  They will arrange outpatient follow-up in the clinic in 2 weeks.     Acute DVT right peroneal vein, left soleal vein -This was diagnosed during his previous hospitalization.  Patient was on warfarin.  Warfarin was held.  INR drifted down.  He was then placed back on heparin infusion and warfarin.  Surprisingly INR has decreased further today.  Interaction with medication is a possibility.  Patient is on rifampin.  Continue Lovenox bridging for now.  No evidence for bleeding.  Await  therapeutic INR.      Normocytic anemia Hemoglobin has been stable for the last many days.  Continue to monitor.  Essential hypertension Blood pressure is reasonably well controlled.  Continue amlodipine and lisinopril.  Chronic pain syndrome -Currently stable, continue gabapentin   Depression Continue home dose of venlafaxine  Hyperlipidemia Continue statin  Coronary artery disease Stable.  Asymptomatic.  Cognitive impairment, possible underlying dementia Continue Aricept, Namenda  DVT Prophylaxis: Warfarin and Lovenox. Code Status: Full code Family Communication: Discussed with the patient. Disposition Plan: Patient remains medically stable.  Await placement.    LOS: 10 days   [place] [facility] Pager [phone] 05/21/2018, 12:31 PM  If 7PM-7AM, please contact night-coverage at [email or web address], password [facility]

## 2018-05-22 ENCOUNTER — Ambulatory Visit: Payer: Federal, State, Local not specified - PPO | Admitting: Neurology

## 2018-05-22 DIAGNOSIS — M11261 Other chondrocalcinosis, right knee: Secondary | ICD-10-CM | POA: Diagnosis not present

## 2018-05-22 DIAGNOSIS — G894 Chronic pain syndrome: Secondary | ICD-10-CM | POA: Diagnosis not present

## 2018-05-22 DIAGNOSIS — I82509 Chronic embolism and thrombosis of unspecified deep veins of unspecified lower extremity: Secondary | ICD-10-CM | POA: Diagnosis not present

## 2018-05-22 DIAGNOSIS — G992 Myelopathy in diseases classified elsewhere: Secondary | ICD-10-CM | POA: Diagnosis not present

## 2018-05-22 DIAGNOSIS — E785 Hyperlipidemia, unspecified: Secondary | ICD-10-CM | POA: Diagnosis not present

## 2018-05-22 DIAGNOSIS — I2510 Atherosclerotic heart disease of native coronary artery without angina pectoris: Secondary | ICD-10-CM | POA: Diagnosis not present

## 2018-05-22 DIAGNOSIS — I82403 Acute embolism and thrombosis of unspecified deep veins of lower extremity, bilateral: Secondary | ICD-10-CM | POA: Diagnosis not present

## 2018-05-22 DIAGNOSIS — Z4789 Encounter for other orthopedic aftercare: Secondary | ICD-10-CM | POA: Diagnosis not present

## 2018-05-22 DIAGNOSIS — M11262 Other chondrocalcinosis, left knee: Secondary | ICD-10-CM | POA: Diagnosis not present

## 2018-05-22 DIAGNOSIS — Z6834 Body mass index (BMI) 34.0-34.9, adult: Secondary | ICD-10-CM | POA: Diagnosis not present

## 2018-05-22 DIAGNOSIS — M25062 Hemarthrosis, left knee: Secondary | ICD-10-CM | POA: Diagnosis not present

## 2018-05-22 DIAGNOSIS — E669 Obesity, unspecified: Secondary | ICD-10-CM | POA: Diagnosis not present

## 2018-05-22 DIAGNOSIS — I10 Essential (primary) hypertension: Secondary | ICD-10-CM | POA: Diagnosis not present

## 2018-05-22 DIAGNOSIS — R7881 Bacteremia: Secondary | ICD-10-CM | POA: Diagnosis not present

## 2018-05-22 DIAGNOSIS — B9561 Methicillin susceptible Staphylococcus aureus infection as the cause of diseases classified elsewhere: Secondary | ICD-10-CM | POA: Diagnosis not present

## 2018-05-22 DIAGNOSIS — G4733 Obstructive sleep apnea (adult) (pediatric): Secondary | ICD-10-CM | POA: Diagnosis not present

## 2018-05-22 DIAGNOSIS — M25561 Pain in right knee: Secondary | ICD-10-CM | POA: Diagnosis not present

## 2018-05-22 DIAGNOSIS — G3184 Mild cognitive impairment, so stated: Secondary | ICD-10-CM | POA: Diagnosis not present

## 2018-05-22 DIAGNOSIS — M542 Cervicalgia: Secondary | ICD-10-CM | POA: Diagnosis not present

## 2018-05-22 DIAGNOSIS — M4714 Other spondylosis with myelopathy, thoracic region: Secondary | ICD-10-CM | POA: Diagnosis not present

## 2018-05-22 DIAGNOSIS — Z981 Arthrodesis status: Secondary | ICD-10-CM | POA: Diagnosis not present

## 2018-05-22 DIAGNOSIS — M4625 Osteomyelitis of vertebra, thoracolumbar region: Secondary | ICD-10-CM | POA: Diagnosis not present

## 2018-05-22 DIAGNOSIS — M25061 Hemarthrosis, right knee: Secondary | ICD-10-CM | POA: Diagnosis not present

## 2018-05-22 DIAGNOSIS — R531 Weakness: Secondary | ICD-10-CM | POA: Diagnosis not present

## 2018-05-22 DIAGNOSIS — G061 Intraspinal abscess and granuloma: Secondary | ICD-10-CM | POA: Diagnosis not present

## 2018-05-22 DIAGNOSIS — Z7901 Long term (current) use of anticoagulants: Secondary | ICD-10-CM | POA: Diagnosis not present

## 2018-05-22 DIAGNOSIS — M4644 Discitis, unspecified, thoracic region: Secondary | ICD-10-CM | POA: Diagnosis not present

## 2018-05-22 LAB — BASIC METABOLIC PANEL
Anion gap: 5 (ref 5–15)
Anion gap: 11 (ref 5–15)
BUN: 8 mg/dL (ref 6–20)
BUN: 10 mg/dL (ref 6–20)
CO2: 23 mmol/L (ref 22–32)
CO2: 26 mmol/L (ref 22–32)
Calcium: 6.5 mg/dL — ABNORMAL LOW (ref 8.9–10.3)
Calcium: 9.4 mg/dL (ref 8.9–10.3)
Chloride: 116 mmol/L — ABNORMAL HIGH (ref 101–111)
Chloride: 102 mmol/L (ref 101–111)
Creatinine, Ser: 0.54 mg/dL — ABNORMAL LOW (ref 0.61–1.24)
Creatinine, Ser: 0.87 mg/dL (ref 0.61–1.24)
GFR calc Af Amer: >60 mL/min (ref >60)
GFR calc Af Amer: >60 mL/min (ref >60)
GFR calc non Af Amer: >60 mL/min (ref >60)
GFR calc non Af Amer: >60 mL/min (ref >60)
Glucose, Bld: 64 mg/dL — ABNORMAL LOW (ref 65–99)
Glucose, Bld: 108 mg/dL — ABNORMAL HIGH (ref 65–99)
Potassium: 3 mmol/L — ABNORMAL LOW (ref 3.5–5.1)
Potassium: 3.9 mmol/L (ref 3.5–5.1)
Sodium: 144 mmol/L (ref 135–145)
Sodium: 139 mmol/L (ref 135–145)

## 2018-05-22 LAB — CBC
HCT: 27.4 % — ABNORMAL LOW (ref 39.0–52.0)
HCT: 38 % — ABNORMAL LOW (ref 39.0–52.0)
Hemoglobin: 7.6 g/dL — ABNORMAL LOW (ref 13.0–17.0)
Hemoglobin: 10.5 g/dL — ABNORMAL LOW (ref 13.0–17.0)
MCH: 23.2 pg — ABNORMAL LOW (ref 26.0–34.0)
MCH: 22.9 pg — ABNORMAL LOW (ref 26.0–34.0)
MCHC: 27.7 g/dL — ABNORMAL LOW (ref 30.0–36.0)
MCHC: 27.6 g/dL — ABNORMAL LOW (ref 30.0–36.0)
MCV: 83.8 fL (ref 78.0–100.0)
MCV: 83 fL (ref 78.0–100.0)
Platelets: 519 10*3/uL — ABNORMAL HIGH (ref 150–400)
Platelets: 842 10*3/uL — ABNORMAL HIGH (ref 150–400)
RBC: 3.27 MIL/uL — ABNORMAL LOW (ref 4.22–5.81)
RBC: 4.58 MIL/uL (ref 4.22–5.81)
RDW: 16.9 % — ABNORMAL HIGH (ref 11.5–15.5)
RDW: 16.9 % — ABNORMAL HIGH (ref 11.5–15.5)
WBC: 5.9 10*3/uL (ref 4.0–10.5)
WBC: 8.7 10*3/uL (ref 4.0–10.5)

## 2018-05-22 LAB — PROTIME-INR
INR: 1.64
Prothrombin Time: 19.3 seconds — ABNORMAL HIGH (ref 11.4–15.2)

## 2018-05-22 MED ORDER — CYCLOSPORINE 0.05 % OP EMUL: OPHTHALMIC | 0 refills | Status: DC

## 2018-05-22 MED ORDER — PREDNISONE 20 MG PO TABS: 20.0000 mg | ORAL_TABLET | Freq: Every day | ORAL | 0 refills | Status: DC

## 2018-05-22 MED ORDER — BACLOFEN 5 MG PO TABS: 5.0000 mg | ORAL_TABLET | Freq: Three times a day (TID) | ORAL | 0 refills | Status: DC

## 2018-05-22 MED ORDER — ASPIRIN 81 MG PO TABS: 81.0000 mg | ORAL_TABLET | Freq: Every day | ORAL | 0 refills | Status: DC

## 2018-05-22 MED ORDER — ENOXAPARIN SODIUM 150 MG/ML ~~LOC~~ SOLN: 130.0000 mg | Freq: Two times a day (BID) | SUBCUTANEOUS | 0 refills | Status: DC

## 2018-05-22 MED ORDER — GABAPENTIN 400 MG PO CAPS: 400.0000 mg | ORAL_CAPSULE | Freq: Three times a day (TID) | ORAL | 0 refills | Status: DC

## 2018-05-22 MED ORDER — POLYETHYLENE GLYCOL 3350 17 G PO PACK: 17.0000 g | PACK | Freq: Every day | ORAL | 0 refills | Status: DC

## 2018-05-22 MED ORDER — VENLAFAXINE HCL ER 150 MG PO CP24: 150.0000 mg | ORAL_CAPSULE | Freq: Two times a day (BID) | ORAL | 0 refills | Status: AC

## 2018-05-22 MED ORDER — CEFAZOLIN IV (FOR PTA / DISCHARGE USE ONLY): 2.0000 g | Freq: Three times a day (TID) | INTRAVENOUS | 0 refills | Status: DC

## 2018-05-22 MED ORDER — VITAMIN B-12 1000 MCG PO TABS: 1000.0000 ug | ORAL_TABLET | Freq: Every day | ORAL | 0 refills | Status: DC

## 2018-05-22 MED ORDER — OXYCODONE HCL 10 MG PO TABS: 10.0000 mg | ORAL_TABLET | ORAL | 0 refills | Status: DC | PRN

## 2018-05-22 MED ORDER — CEFAZOLIN IV (FOR PTA / DISCHARGE USE ONLY): 2.0000 g | Freq: Three times a day (TID) | INTRAVENOUS | Status: DC

## 2018-05-22 MED ORDER — DONEPEZIL HCL 10 MG PO TABS: 10.0000 mg | ORAL_TABLET | Freq: Every day | ORAL | 0 refills | Status: DC

## 2018-05-22 MED ORDER — LATANOPROST 0.005 % OP SOLN: 1.0000 [drp] | Freq: Every day | OPHTHALMIC | 0 refills | Status: DC

## 2018-05-22 MED ORDER — TRAZODONE HCL 100 MG PO TABS: 100.0000 mg | ORAL_TABLET | Freq: Every day | ORAL | 0 refills | Status: DC

## 2018-05-22 MED ORDER — MEMANTINE HCL 10 MG PO TABS: 10.0000 mg | ORAL_TABLET | Freq: Two times a day (BID) | ORAL | 0 refills | Status: DC

## 2018-05-22 MED ORDER — AMLODIPINE BESYLATE 10 MG PO TABS: 10.0000 mg | ORAL_TABLET | Freq: Every day | ORAL | 0 refills | Status: DC

## 2018-05-22 MED ORDER — DIAZEPAM 5 MG PO TABS: 5.0000 mg | ORAL_TABLET | Freq: Four times a day (QID) | ORAL | 0 refills | Status: DC | PRN

## 2018-05-22 MED ORDER — RIFAMPIN 300 MG PO CAPS: 300.0000 mg | ORAL_CAPSULE | Freq: Two times a day (BID) | ORAL | 0 refills | Status: DC

## 2018-05-22 MED ORDER — WARFARIN SODIUM 5 MG PO TABS: ORAL_TABLET | ORAL | 1 refills | Status: DC

## 2018-05-22 MED ORDER — FINASTERIDE 5 MG PO TABS: 5.0000 mg | ORAL_TABLET | Freq: Every day | ORAL | 0 refills | Status: AC

## 2018-05-22 MED ORDER — LISINOPRIL 5 MG PO TABS: 5.0000 mg | ORAL_TABLET | Freq: Every day | ORAL | 0 refills | Status: DC

## 2018-05-22 MED ORDER — DOCUSATE SODIUM 100 MG PO CAPS: 200.0000 mg | ORAL_CAPSULE | Freq: Two times a day (BID) | ORAL | 0 refills | Status: DC

## 2018-05-22 MED ORDER — FLUTICASONE PROPIONATE 50 MCG/ACT NA SUSP: 1.0000 | Freq: Every day | NASAL | 2 refills | Status: AC | PRN

## 2018-05-22 MED ORDER — COLCHICINE 0.6 MG PO TABS: 0.6000 mg | ORAL_TABLET | Freq: Every day | ORAL | 0 refills | Status: DC

## 2018-05-22 MED ORDER — LINACLOTIDE 290 MCG PO CAPS: 290.0000 ug | ORAL_CAPSULE | Freq: Every day | ORAL | 0 refills | Status: DC

## 2018-05-22 MED ORDER — OMEPRAZOLE 20 MG PO CPDR: 40.0000 mg | DELAYED_RELEASE_CAPSULE | Freq: Every day | ORAL | 0 refills | Status: DC

## 2018-05-22 MED ORDER — MELATONIN 3 MG PO TABS: 1.5000 mg | ORAL_TABLET | Freq: Every day | ORAL | 0 refills | Status: DC

## 2018-05-22 MED ORDER — PRAVASTATIN SODIUM 10 MG PO TABS: 10.0000 mg | ORAL_TABLET | Freq: Every evening | ORAL | 0 refills | Status: DC

## 2018-05-22 MED ORDER — POTASSIUM CHLORIDE CRYS ER 20 MEQ PO TBCR: 20.0000 meq | EXTENDED_RELEASE_TABLET | Freq: Every day | ORAL | 0 refills | Status: DC

## 2018-05-22 NOTE — Progress Notes (Signed)
Discharge to: [place] Anticipated discharge date: 05/22/18 Family notified: Yes, at bedside Transportation by: Family car  Report #: [phone] OR [phone], if unable to get anyone at first number  CSW signing off.  Laveda Abbe LCSW [phone]

## 2018-05-22 NOTE — Progress Notes (Addendum)
Physical Therapy Treatment Patient Details Name: Cody Velez MRN: [id number] DOB: March 08, 1946 Today's Date: 05/22/2018    History of Present Illness Pt is a 72 y.o. male with medical history significant of obstructive sleep apnea, hypertension, obesity, chronic back pain with multiple 3 previous surgeries who recently had an epidural abscess causing paraplegia surgically decompressed on March 28, 2018 has been on 8 weeks duration of cefazolin and rifampin until May 22, 2018.  Patient has been in short-term rehab and was just discharged this past Saturday.  He went home and since being home he is fallen several times.  Family brought him back in due to his frequent falls and the need for further rehab versus skilled nursing facility placement. Patient is being admitted again further evaluation for more rehabilitation. Per Neurology - consistent with a slowly recovering myelopathy patient. 05/18/17 s/p partial medial menisectomy and surgical lavage and chondroplasty (right).    PT Comments    Patient is making progress toward PT goals and able to STS X3 from elevated EOB and stand pivot to w/c with min/mod A +2. Continue to progress as tolerated and recommend post acute rehab for further skilled PT services to maximize independence and safety with mobility.    Follow Up Recommendations  CIR     Equipment Recommendations  None recommended by PT    Recommendations for Other Services       Precautions / Restrictions Precautions Precautions: Fall;Back Precaution Booklet Issued: No Required Braces or Orthoses: Spinal Brace Knee Immobilizer - Right: On when out of bed or walking Spinal Brace: Thoracolumbosacral orthotic Other Brace/Splint: patient has bilateral hinged knee braces in room (did not use this treatment) Restrictions Weight Bearing Restrictions: Yes RLE Weight Bearing: Weight bearing as tolerated    Mobility  Bed Mobility Overal bed mobility: Needs Assistance Bed Mobility:  Rolling;Sidelying to Sit Rolling: Supervision Sidelying to sit: Min guard       General bed mobility comments: cues for sequencing to maintain back precautions   Transfers Overall transfer level: Needs assistance Equipment used: Rolling walker (2 wheeled) Transfers: Sit to/from [facility] Sit to Stand: Min assist;+2 physical assistance;From elevated surface Stand pivot transfers: Mod assist;+2 physical assistance       General transfer comment: pt able to stand X3 from elevated EOB with min A +2 for power up into standing and to steady upon stand; cues for safe hand placement; pt maintains bilat knee flexion with relies heavily on bilat UE support; pt able to stand pivot to w/c with mod a +2 and began sitting prematurely due to fatigue  Ambulation/Gait                 Stairs             Wheelchair Mobility    Modified Rankin (Stroke Patients Only)       Balance Overall balance assessment: Needs assistance Sitting-balance support: Feet supported;No upper extremity supported Sitting balance-Leahy Scale: Good     Standing balance support: During functional activity;Bilateral upper extremity supported Standing balance-Leahy Scale: Poor Standing balance comment: relies heavily on RW with additional support from therapists                            Cognition Arousal/Alertness: Awake/alert Behavior During Therapy: [facility] for tasks assessed/performed Overall Cognitive Status: Within Functional Limits for tasks assessed  Exercises      General Comments General comments (skin integrity, edema, etc.): wife present throughout      Pertinent Vitals/Pain Pain Assessment: No/denies pain    Home Living                      Prior Function            PT Goals (current goals can now be found in the care plan section) Acute Rehab PT Goals Patient Stated Goal: to  walk Progress towards PT goals: Progressing toward goals    Frequency    Min 2X/week      PT Plan Current plan remains appropriate    Co-evaluation PT/OT/SLP Co-Evaluation/Treatment: Yes Reason for Co-Treatment: For patient/therapist safety;To address functional/ADL transfers PT goals addressed during session: Mobility/safety with mobility        AM-PAC PT "6 Clicks" Daily Activity  Outcome Measure  Difficulty turning over in bed (including adjusting bedclothes, sheets and blankets)?: Unable Difficulty moving from lying on back to sitting on the side of the bed? : Unable Difficulty sitting down on and standing up from a chair with arms (e.g., wheelchair, bedside commode, etc,.)?: Unable Help needed moving to and from a bed to chair (including a wheelchair)?: A Lot Help needed walking in hospital room?: Total Help needed climbing 3-5 steps with a railing? : Total 6 Click Score: 7    End of Session Equipment Utilized During Treatment: Gait belt;Back brace Activity Tolerance: Patient tolerated treatment well Patient left: Other (comment);with call bell/phone within reach Nurse Communication: Mobility status PT Visit Diagnosis: Unsteadiness on feet (R26.81);Other abnormalities of gait and mobility (R26.89);Muscle weakness (generalized) (M62.81)     Time: 10:01-10:38 PT Time Calculation (min) (ACUTE ONLY): 27 min  Charges:  $Therapeutic Activity: 8-22 mins                    G Codes:       Earney Navy, PTA Pager: [phone]     Darliss Cheney 05/22/2018, 3:32 PM

## 2018-05-22 NOTE — Progress Notes (Signed)
Occupational Therapy Treatment Patient Details Name: Cody Velez MRN: [id number] DOB: [birth year] Today's Date: 05/22/2018    History of present illness Pt is a 72 y.o. male with medical history significant of obstructive sleep apnea, hypertension, obesity, chronic back pain with multiple 3 previous surgeries who recently had an epidural abscess causing paraplegia surgically decompressed on March 28, 2018 has been on 8 weeks duration of cefazolin and rifampin until May 22, 2018.  Patient has been in short-term rehab and was just discharged this past Saturday.  He went home and since being home he is fallen several times.  Family brought him back in due to his frequent falls and the need for further rehab versus skilled nursing facility placement. Patient is being admitted again further evaluation for more rehabilitation. Per Neurology - consistent with a slowly recovering myelopathy patient. 05/18/17 s/p partial medial menisectomy and surgical lavage and chondroplasty (right).   OT comments  This 72 yo male admitted with above presents to acute OT making gains in bed mobility, sit<>stands, and transfers with RW thus increasing his independence with basic ADLs. He will continue to benefit from acute OT with follow up on CIR.  Follow Up Recommendations  CIR;Supervision/Assistance - 24 hour    Equipment Recommendations  Hospital bed    Recommendations for Other Services Rehab consult    Precautions / Restrictions Precautions Precautions: Fall;Back Required Braces or Orthoses: Spinal Brace Knee Immobilizer - Right: On when out of bed or walking Spinal Brace: Thoracolumbosacral orthotic Other Brace/Splint: patient has bilateral hinged knee braces in room (did not use this treatment) Restrictions Weight Bearing Restrictions: No RLE Weight Bearing: Weight bearing as tolerated LLE Weight Bearing: Weight bearing as tolerated       Mobility Bed Mobility Overal bed mobility: Needs  Assistance Bed Mobility: Rolling;Sidelying to Sit Rolling: Supervision Sidelying to sit: Min guard          Transfers Overall transfer level: Needs assistance Equipment used: Rolling walker (2 wheeled) Transfers: Sit to/from Stand Sit to Stand: Min assist;+2 physical assistance;From elevated surface         General transfer comment: Pt stood x 3 from raised bed with Min A +2, able to maintain standing all 3 times at least 30 seconds. Second time he was able to "march in place" a couple steps each leg and third time he stood and pivoted with RW to W/C    Balance Overall balance assessment: Needs assistance Sitting-balance support: Feet supported;No upper extremity supported Sitting balance-Leahy Scale: Good     Standing balance support: During functional activity;Bilateral upper extremity supported Standing balance-Leahy Scale: Poor Standing balance comment: relies heavily on RW with additional support from therapists                           ADL either performed or assessed with clinical judgement   ADL Overall ADL's : Needs assistance/impaired                     Lower Body Dressing: Total assistance;Bed level Lower Body Dressing Details (indicate cue type and reason): rolling left to right with S with use of rails, VCs to bend up knees first before rolling.  Toilet Transfer: Moderate assistance;+2 for physical assistance;Stand-pivot;RW Toilet Transfer Details (indicate cue type and reason): Bed (raised)>recliner on left next to bed                 Vision Baseline Vision/History: Wears glasses Wears Glasses:  Reading only Patient Visual Report: No change from baseline            Cognition Arousal/Alertness: Awake/alert Behavior During Therapy: WFL for tasks assessed/performed Overall Cognitive Status: Within Functional Limits for tasks assessed                                                     Pertinent Vitals/  Pain       Pain Assessment: No/denies pain         Frequency  Min 2X/week        Progress Toward Goals  OT Goals(current goals can now be found in the care plan section)  Progress towards OT goals: Progressing toward goals     Plan Discharge plan remains appropriate    Co-evaluation    PT/OT/SLP Co-Evaluation/Treatment: Yes Reason for Co-Treatment: For patient/therapist safety;To address functional/ADL transfers          AM-PAC PT "6 Clicks" Daily Activity     Outcome Measure   Help from another person eating meals?: None Help from another person taking care of personal grooming?: A Little Help from another person toileting, which includes using toliet, bedpan, or urinal?: A Lot Help from another person bathing (including washing, rinsing, drying)?: A Lot Help from another person to put on and taking off regular upper body clothing?: A Little Help from another person to put on and taking off regular lower body clothing?: A Lot 6 Click Score: 16    End of Session Equipment Utilized During Treatment: Gait belt;Rolling walker;Back brace  OT Visit Diagnosis: Muscle weakness (generalized) (M62.81);Other abnormalities of gait and mobility (R26.89)   Activity Tolerance Patient tolerated treatment well   Patient Left (in W/C )   Nurse Communication Mobility status(lateral scoot transfer back to bed from W/C)        Time: 0160-1093 OT Time Calculation (min): 37 min  Charges: OT General Charges $OT Visit: 1 Visit OT Treatments $Self Care/Home Management : 8-22 mins  [address], OTR/L [phone] 05/22/2018

## 2018-05-22 NOTE — Discharge Instructions (Signed)
OK to weight bear as tolerated both legs.  Change dressings over right knee with dry gauze and/or band-aids daily as needed for drainage.

## 2018-05-22 NOTE — Progress Notes (Signed)
Attempted to call report x4 events I was asked to hold phone ringing no one ever answered, time spent trying to call report 20 min

## 2018-05-22 NOTE — Discharge Summary (Signed)
[facility]  Physician Discharge Summary   Patient ID: Cody Velez MRN: [id number] DOB/AGE: [birth year] 72 y.o.  Admit date: 05/08/2018 Discharge date: 05/22/2018  PCP: Asencion Noble, MD  DISCHARGE DIAGNOSES:  MSSA bacteremia on long-term IV antibiotics Acute DVT right and left lower extremity Bilateral knee effusion, possible infection versus pseudogout   RECOMMENDATIONS FOR OUTPATIENT FOLLOW UP: 1. End date for patient's antibiotics including cefazolin and rifampin changed to June 18, 2018. 2. Will need weekly CBC with differential as well as CMP 3. The patient to be followed at the [facility] 4. Continue with Lovenox bridging until INR is therapeutic between 2 and 3.  DISCHARGE CONDITION: fair  Diet recommendation: Heart healthy  Filed Weights   05/19/18 0408 05/21/18 0359 05/22/18 0328  Weight: 128.5 kg (283 lb 4.7 oz) 128.9 kg (284 lb 2.8 oz) 129.5 kg (285 lb 7.9 oz)    INITIAL HISTORY: 72 year old male withhypertension, hyperlipidemia, morbid obesity, CAD, chronic back pain status post spinal stimulator(C-spine) placed 1/30/19presented withincreasing mechanical falls at home. Patient was recently discharged from [facility] a stay from 03/30/2018 through 05/06/2018. The patient was initially admitted in early April 2019, found to have MSSA bacteremia. Extensive work-up at that time revealed epidural abscess and T10-11 osteomyelitis and discitis, underwent laminectomy, decompression of epidural abscess, and methacrylate screw augmentation on 03/28/2018 performed by Dr. Ellene Route.The patient was followed by infectious disease. They recommended cefazolin and rifampin for 8 weeks to be completed on 05/22/2018. He was subsequently discharged to inpatient rehabilitation on 03/30/2018. Patient was making significant progress at that time. However since discharge home, the patient has noted increasing lower extremity weakness and increasing falling.  Patient was  hospitalized.  Seen by neurosurgery.  Was waiting on placement to skilled nursing facility.  He was noted to have swelling of his right knee.  Orthopedics was consulted.  There was concern for pseudogout versus infection.  Hemarthrosis was also noted.  He was taken to the OR for washout.  Consultants:  Dr. Ellene Route Orthopedics  Procedures:  Myelogram  RIGHT KNEE ARTHROSCOPY,PARTIAL MEDIAL MENISECTOMY AND SURGICAL LAVAGE AND CHONDROPLASTY   HOSPITAL COURSE:   Bilateral lower extremity weakness, falls -CT cervical, thoracic, lumbar spine 5/22 showed new endplate erosion [id number] with a increased paravertebral inflammation, concern for septic arthritis -Myelogram completed on 5/24, reviewed by Dr. Ellene Route per neurosurgery, patient does not have evidence of canal compromise, Spinal cord compression epidural collections or neuro compromise from an anatomic basis. - Recommended neurology consult, seen by Dr. Orlena Sheldon, recommended PT OT evaluation for rehab -PT recommended inpatient rehab, evaluated by CIR but denied. Patient somewhat upset but agreeable to pursue other facilities.    Patient accepted at the [facility] inpatient rehab.   -baclofen for right leg spasms  Bilateral knee pain/hemarthrosis/pseudogout Patient was noted to have swelling involving both his knees especially the right.  Imaging studies done at the time of admission did not show any fractures.  He was noted to have effusion on examination.  He was seen by orthopedics.  Underwent arthrocentesis of both his joints.  Significantly higher WBC (68500) noted in the synovial fluid from the right knee.  The fluid was also bloody from the right knee.  WBC was 9700 in the left knee.  Patient underwent CT scan of the right knee joint which raised concern for infection.  Patient was taken to the operating room for washout.  Crystals were also noted.    Cultures were sent from both the knees.  Negative so far.  It is quite possible that  the knee effusion was due to a combination of pseudogout and osteoarthritis. Patient started on prednisone and colchicine.  Prednisone will be tapered down over the next few days.  Continue colchicine.  Continue antibiotics as outlined below.  Epidural abscess/vertebral osteomyelitis/discitis -Blood cultures had grown MSSA previously, Status post I&D and laminectomy on 4/9 -Patient has been on cefazolin and rifampin with a tentative stop date of 05/22/2018.  However due to possibility of infection of the right knee infectious disease was consulted.  Per infectious disease new end date will be 06/18/2018.  Patient will need weekly CBC with differential and complete metabolic panel.  They will arrange outpatient follow-up in the clinic in 2 weeks.     Acute DVT right peroneal vein, left soleal vein -This was diagnosed during his previous hospitalization.  Patient was on warfarin.  Warfarin was held.  INR drifted down.  He was then placed back on heparin infusion and warfarin.    Patient appears to be experiencing interaction of medications causing INR to be lower than expected.  This is likely due to the fact that he is on rifampin.  He has been requiring high dose of warfarin.  He was given 15 mg of warfarin yesterday.  Plan will be to give additional 15 mg of warfarin tonight.  Please check INR tomorrow and then dose appropriately.  Lovenox can be discontinued once INR is between 2 and 3.  Once patient is off of rifampin he will likely need a lower dose of warfarin.   He will need treatment for 3 months.  Normocytic anemia IMA globin has been stable.  Essential hypertension Blood pressure is reasonably well controlled.  Continue amlodipine and lisinopril.  Chronic pain syndrome Stable.  Continue home medications.  Depression Continue home dose of venlafaxine  Hyperlipidemia Continue statin  Coronary artery disease Stable.  Asymptomatic.  Cognitive impairment, possible underlying  dementia Continue Aricept, Namenda  Overall stable.  Looking forward to going to inpatient rehab.  Patient had blood work this morning which showed low hemoglobin. These labs were repeated and are more consistent with the values from the last few days.  It is quite possible the blood drawn from this morning from was from the PICC line and might have been diluted with the IV fluid.  No need for further work-up.  Follow closely at the inpatient rehab.  Medically stable for discharge today.    PERTINENT LABS:  The results of significant diagnostics from this hospitalization (including imaging, microbiology, ancillary and laboratory) are listed below for reference.    Microbiology: Recent Results (from the past 240 hour(s))  Body fluid culture     Status: None   Collection Time: 05/17/18  2:53 PM  Result Value Ref Range Status   Specimen Description SYNOVIAL LEFT KNEE  Final   Special Requests NONE  Final   Gram Stain   Final    ABUNDANT WBC PRESENT, PREDOMINANTLY PMN NO ORGANISMS SEEN    Culture   Final    NO GROWTH 3 DAYS Performed at [facility], [address] [place], [place] [address]    Report Status 05/21/2018 FINAL  Final  Body fluid culture     Status: None   Collection Time: 05/17/18  2:53 PM  Result Value Ref Range Status   Specimen Description SYNOVIAL RIGHT KNEE  Final   Special Requests NONE  Final   Gram Stain   Final    ABUNDANT WBC PRESENT, PREDOMINANTLY  PMN NO ORGANISMS SEEN    Culture   Final    NO GROWTH 3 DAYS Performed at [facility] [address], [place], [place] [address]    Report Status 05/21/2018 FINAL  Final     Labs: Basic Metabolic Panel: Recent Labs  Lab 05/16/18 0509 05/19/18 0502 05/20/18 0402 05/22/18 0500 05/22/18 0959  NA 137 136 137 144 139  K 4.1 4.8 3.9 3.0* 3.9  CL 102 104 103 116* 102  CO2 '27 27 28 23 26  ' GLUCOSE 89 124* 94 64* 108*  BUN '7 10 10 8 10  ' CREATININE 0.81 0.83 0.79 0.54* 0.87  CALCIUM  8.8* 8.6* 8.8* 6.5* 9.4   Liver Function Tests: Recent Labs  Lab 05/20/18 0402  AST 17  ALT 10*  ALKPHOS 81  BILITOT 0.4  PROT 7.3  ALBUMIN 2.2*   CBC: Recent Labs  Lab 05/18/18 1721 05/19/18 0502 05/20/18 0402 05/22/18 0500 05/22/18 0959  WBC 8.3 8.3 7.2 5.9 8.7  HGB 9.4* 8.9* 9.0* 7.6* 10.5*  HCT 33.3* 31.5* 32.0* 27.4* 38.0*  MCV 82.4 82.5 82.1 83.8 83.0  PLT 588* 594* 637* 519* 842*     IMAGING STUDIES Dg Lumbar Spine 2-3 Views  Result Date: 05/08/2018 CLINICAL DATA:  Low back pain after a fall. EXAM: LUMBAR SPINE - 2-3 VIEW COMPARISON:  Spot fluoro films from 03/28/2018. FINDINGS: Patient is status post extensive thoracolumbar fusion posteriorly with incomplete visualization of the thoracolumbar fusion hardware. Image portions of the hardware show no complicating features. Interbody cages are seen at L4-5 and L5-S1. Bones are diffusely demineralized. Although no definite compression deformity is evident, lateral film is limited in utility given the demineralization. IMPRESSION: No evidence for an acute fracture on this limited two-view study. Electronically Signed   By: Misty Stanley M.D.   On: 05/08/2018 16:58   Dg Knee 1-2 Views Left  Result Date: 05/08/2018 CLINICAL DATA:  [place] Saturday landing on bilateral knees. EXAM: RIGHT KNEE - 1-2 VIEW; LEFT KNEE - 1-2 VIEW COMPARISON:  MRI of the LEFT knee November 14, 2009 FINDINGS: LEFT: No acute fracture deformity or dislocation. Moderate to severe tricompartmental joint space narrowing with periarticular sclerosis and marginal spurring. Osteopenia without destructive bony lesions. Small suprapatellar joint effusion. Mild vascular calcifications. RIGHT: No acute fracture deformity dislocation. Moderate tricompartmental joint space narrowing with periarticular sclerosis and marginal spurring. No destructive bony lesions. Osteopenia. Soft tissue planes are nonsuspicious. IMPRESSION: 1. No acute fracture deformity dislocation. Small  LEFT suprapatellar joint effusion. 2. Moderate RIGHT and moderate to severe LEFT tricompartmental osteoarthrosis. Electronically Signed   By: Elon Alas M.D.   On: 05/08/2018 16:55   Dg Knee 1-2 Views Right  Result Date: 05/08/2018 CLINICAL DATA:  [place] landing on bilateral knees. EXAM: RIGHT KNEE - 1-2 VIEW; LEFT KNEE - 1-2 VIEW COMPARISON:  MRI of the LEFT knee November 14, 2009 FINDINGS: LEFT: No acute fracture deformity or dislocation. Moderate to severe tricompartmental joint space narrowing with periarticular sclerosis and marginal spurring. Osteopenia without destructive bony lesions. Small suprapatellar joint effusion. Mild vascular calcifications. RIGHT: No acute fracture deformity dislocation. Moderate tricompartmental joint space narrowing with periarticular sclerosis and marginal spurring. No destructive bony lesions. Osteopenia. Soft tissue planes are nonsuspicious. IMPRESSION: 1. No acute fracture deformity dislocation. Small LEFT suprapatellar joint effusion. 2. Moderate RIGHT and moderate to severe LEFT tricompartmental osteoarthrosis. Electronically Signed   By: Elon Alas M.D.   On: 05/08/2018 16:55   Ct Cervical Spine Wo Contrast  Result Date: 05/12/2018 CLINICAL DATA:  Worsening paraplegia.  History of spinal infection. FLUOROSCOPY TIME:  1 minutes and 40 seconds.  Fifteen spot films. PROCEDURE: LUMBAR PUNCTURE FOR CERVICAL LUMBAR AND THORACIC MYELOGRAM CERVICAL AND LUMBAR AND THORACIC MYELOGRAM CT CERVICAL MYELOGRAM CT LUMBAR MYELOGRAM CT THORACIC MYELOGRAM After thorough discussion of risks and benefits of the procedure including bleeding, infection, injury to nerves, blood vessels, adjacent structures as well as headache and CSF leak, written and oral informed consent was obtained. Consent was obtained by Dr. Rolla Flatten. Patient was positioned prone on the fluoroscopy table. Local anesthesia was provided with 1% lidocaine without epinephrine after prepped and  draped in the usual sterile fashion. Puncture was performed at L2-L3 using a 3 1/2 inch 20 gauge spinal needle via midline approach. Using a single pass through the dura, the needle was placed within the thecal sac, with return of clear CSF. 10 mL Isovue M-300 was injected into the thecal sac, with normal opacification of the nerve roots and cauda equina consistent with free flow within the subarachnoid space. The patient was then moved to the trendelenburg position and contrast flowed into the Thoracic and Cervical spine regions. I personally performed the lumbar puncture and administered the intrathecal contrast. I also personally supervised acquisition of the myelogram images. TECHNIQUE: Contiguous axial images were obtained through the Cervical, Thoracic, and Lumbar spine after the intrathecal infusion of infusion. Coronal and sagittal reconstructions were obtained of the axial image sets. FINDINGS: TOTAL MYELOGRAM FINDINGS: Good opacification lumbar subarachnoid space. No stenosis or blockage. Hardware intact. Patient was placed in lateral decubitus and contrast maneuvered into the thoracic region where no block was evident. Patient was further tilted down in contrast maneuvered into the cervical region where no block was present. Dorsal column stimulator leads spanned the cervicothoracic junction. Patient was escorted to CT scanning for further evaluation. CT CERVICAL MYELOGRAM FINDINGS: Alignment: Anatomic except for mild facet mediated slip C2-C3, adjacent segment. Vertebrae: No worrisome osseous lesion.  Solid arthrodesis C3-C6. Cord: Dorsal column stimulator terminating at C4. No cord compression. Paraspinal tissues: Unremarkable. Disc levels: Unchanged disc and facet degeneration compared to last month study. Uncovertebral and facet spurring result in multilevel neural foraminal stenosis moderate to severe on the LEFT at C2-3 and moderate to severe on the RIGHT at C3-4. CT LUMBAR MYELOGRAM FINDINGS:  Segmentation: Normal. Alignment:  Normal. Vertebrae: No worrisome osseous lesion.L1-2 S1 fusion appears solid. Conus medullaris: Normal in size and location. Paraspinal tissues: No evidence for hydronephrosis or paravertebral mass. Disc levels: Unchanged osseous neural foraminal stenosis at multiple levels, moderate on the LEFT at L5-S1. No significant spinal stenosis. CT THORACIC MYELOGRAM FINDINGS: Alignment: 4 mm anterolisthesis T10 on T11, new/increased from prior. Vertebrae: Erosive endplate changes at H06-23, without disc space collapse. T8-L1 fusion posteriorly. Pedicle screws in good position. Paraspinal tissues: Phlegmonous changes from T9 through T12 greater on the RIGHT. Small pleural effusion.Cord: No cord compression at any level. No significant ventral epidural process at T10-11. Cord: No cord compression, cord enlargement, or significant ventral epidural process at any level. Disc levels: Unchanged severe disc space narrowing and degenerative change at T2-T3. Interval T10-T11 posterior decompression. IMPRESSION: Total myelogram with cervical, thoracic and lumbar postmyelogram CT demonstrates no compressive lesion of the cord at any level. Spinal stimulator device, along with multilevel multisegment fusion hardware appear appropriately placed without adverse features. There is probably some residual infection and paravertebral phlegmon at the T10-11 interspace, but there is no ventral epidural hematoma. Electronically Signed   By: Jenny Reichmann  Alfonse Flavors M.D.   On: 05/12/2018 17:38   Ct Cervical Spine W Contrast  Result Date: 05/11/2018 CLINICAL DATA:  History of discitis-osteomyelitis with epidural abscess at T10-11. Status post T10-11 decompression and debridement with thoracolumbar fusion on 03/28/2018. Worsening lower extremity weakness. EXAM: CT CERVICAL, THORACIC, AND LUMBAR SPINE WITH CONTRAST TECHNIQUE: Multidetector CT imaging of the cervical, lumbar, and thoracic spine was performed with  intravenous contrast. Multiplanar CT image reconstructions were also generated. CONTRAST:  136m ISOVUE-300 IOPAMIDOL (ISOVUE-300) INJECTION 61% COMPARISON:  Cervical spine CT 03/22/2018. Thoracic and lumbar spine CT 03/20/2018. FINDINGS: CT CERVICAL SPINE FINDINGS Alignment: Unchanged cervical spine straightening and trace anterolisthesis of C2 on C3. Skull base and vertebrae: No acute fracture. No interval osseous erosion to suggest infectious discitis-osteomyelitis. Solid C3-C6 ACDF. Soft tissues and spinal canal: Unchanged dorsal column stimulator terminating at the C4 level. No gross epidural fluid collection, however artifact from the stimulator and fusion plate and screws limits assessment. No evidence of significant paraspinal or prevertebral inflammatory changes or fluid collection. Disc levels: Unchanged disc and facet degeneration compared to last month's study. Uncovertebral and facet spurring again result in multilevel neural foraminal stenosis, moderate to severe on the left at C2-3 and on the right at C3-4. Upper chest: Partially visualized right PICC. Aortic atherosclerosis. Small right pleural effusion CT THORACIC SPINE FINDINGS Alignment: 4 mm anterolisthesis of T10 on T11, new/increased from prior. Vertebrae: There are new erosive endplate changes at [id number] disc space collapse. Right T10-11 facet joint widening is unchanged with slightly increased erosion of the right T10 inferior facet. Interval T8-L1 posterior fusion has been performed. Pedicle screws appear well-positioned without evidence of loosening. No acute fracture is identified. Paraspinal and other soft tissues: Paravertebral edema/phlegmonous changes from T9-T12 are greater on the right than on the left and are stable to slightly increased. No organized fluid collection is identified. There is a small right pleural effusion, and there is dependent subsegmental atelectasis in both lungs. A right PICC terminates in the lower SVC.  Aortic and coronary artery atherosclerosis is noted. A dorsal column stimulator enters the canal at T3-4. Disc levels: Unchanged severe disc space narrowing and degenerative endplate changes at TK0-2 Interval T10-11 posterior decompression. Limited assessment for an epidural fluid collection due to streak artifact from posterior spinal instrumentation. No gross epidural collection above the fused segment. CT LUMBAR SPINE FINDINGS Segmentation: 5 lumbar type vertebrae. Alignment: Normal. Vertebrae: Prior L1-L3 posterior fusion, extended in the interim into the thoracic spine to the T8 level as described above. Prior interbody fusion from L1-2 to L5-S1. No evidence of screw loosening. Solid ankylosis at each level. No acute fracture. No osseous erosion to suggest active osteomyelitis. Paraspinal and other soft tissues: No significant paraspinal inflammatory changes or fluid collection to suggest infection. Mild aortoiliac atherosclerosis. Disc levels: Unchanged osseous neural foraminal stenosis at multiple levels, moderate on the left at L5-S1 and milder elsewhere as previously described. No gross spinal stenosis. IMPRESSION: 1. New endplate erosion at [id number] stable to slightly increased paravertebral inflammation consistent with known discitis-osteomyelitis. Limited assessment for epidural abscess. 2. Mild interval osseous erosion involving the right T10-11 facet joint which may reflect septic arthritis. 3. No findings to suggest interval development of infection elsewhere in the cervical, thoracic, or lumbar spine. 4. Interval posterior thoracolumbar fusion as above. Electronically Signed   By: ALogan BoresM.D.   On: 05/11/2018 09:18   Ct Thoracic Spine Wo Contrast  Result Date: 05/12/2018 CLINICAL DATA:  Worsening paraplegia.  History  of spinal infection. FLUOROSCOPY TIME:  1 minutes and 40 seconds.  Fifteen spot films. PROCEDURE: LUMBAR PUNCTURE FOR CERVICAL LUMBAR AND THORACIC MYELOGRAM CERVICAL AND  LUMBAR AND THORACIC MYELOGRAM CT CERVICAL MYELOGRAM CT LUMBAR MYELOGRAM CT THORACIC MYELOGRAM After thorough discussion of risks and benefits of the procedure including bleeding, infection, injury to nerves, blood vessels, adjacent structures as well as headache and CSF leak, written and oral informed consent was obtained. Consent was obtained by Dr. Rolla Flatten. Patient was positioned prone on the fluoroscopy table. Local anesthesia was provided with 1% lidocaine without epinephrine after prepped and draped in the usual sterile fashion. Puncture was performed at L2-L3 using a 3 1/2 inch 20 gauge spinal needle via midline approach. Using a single pass through the dura, the needle was placed within the thecal sac, with return of clear CSF. 10 mL Isovue M-300 was injected into the thecal sac, with normal opacification of the nerve roots and cauda equina consistent with free flow within the subarachnoid space. The patient was then moved to the trendelenburg position and contrast flowed into the Thoracic and Cervical spine regions. I personally performed the lumbar puncture and administered the intrathecal contrast. I also personally supervised acquisition of the myelogram images. TECHNIQUE: Contiguous axial images were obtained through the Cervical, Thoracic, and Lumbar spine after the intrathecal infusion of infusion. Coronal and sagittal reconstructions were obtained of the axial image sets. FINDINGS: TOTAL MYELOGRAM FINDINGS: Good opacification lumbar subarachnoid space. No stenosis or blockage. Hardware intact. Patient was placed in lateral decubitus and contrast maneuvered into the thoracic region where no block was evident. Patient was further tilted down in contrast maneuvered into the cervical region where no block was present. Dorsal column stimulator leads spanned the cervicothoracic junction. Patient was escorted to CT scanning for further evaluation. CT CERVICAL MYELOGRAM FINDINGS: Alignment: Anatomic  except for mild facet mediated slip C2-C3, adjacent segment. Vertebrae: No worrisome osseous lesion.  Solid arthrodesis C3-C6. Cord: Dorsal column stimulator terminating at C4. No cord compression. Paraspinal tissues: Unremarkable. Disc levels: Unchanged disc and facet degeneration compared to last month study. Uncovertebral and facet spurring result in multilevel neural foraminal stenosis moderate to severe on the LEFT at C2-3 and moderate to severe on the RIGHT at C3-4. CT LUMBAR MYELOGRAM FINDINGS: Segmentation: Normal. Alignment:  Normal. Vertebrae: No worrisome osseous lesion.L1-2 S1 fusion appears solid. Conus medullaris: Normal in size and location. Paraspinal tissues: No evidence for hydronephrosis or paravertebral mass. Disc levels: Unchanged osseous neural foraminal stenosis at multiple levels, moderate on the LEFT at L5-S1. No significant spinal stenosis. CT THORACIC MYELOGRAM FINDINGS: Alignment: 4 mm anterolisthesis T10 on T11, new/increased from prior. Vertebrae: Erosive endplate changes at R15-40, without disc space collapse. T8-L1 fusion posteriorly. Pedicle screws in good position. Paraspinal tissues: Phlegmonous changes from T9 through T12 greater on the RIGHT. Small pleural effusion.Cord: No cord compression at any level. No significant ventral epidural process at T10-11. Cord: No cord compression, cord enlargement, or significant ventral epidural process at any level. Disc levels: Unchanged severe disc space narrowing and degenerative change at T2-T3. Interval T10-T11 posterior decompression. IMPRESSION: Total myelogram with cervical, thoracic and lumbar postmyelogram CT demonstrates no compressive lesion of the cord at any level. Spinal stimulator device, along with multilevel multisegment fusion hardware appear appropriately placed without adverse features. There is probably some residual infection and paravertebral phlegmon at the T10-11 interspace, but there is no ventral epidural hematoma.  Electronically Signed   By: Staci Righter M.D.   On: 05/12/2018 17:38  Ct Thoracic Spine W Contrast  Result Date: 05/11/2018 CLINICAL DATA:  History of discitis-osteomyelitis with epidural abscess at T10-11. Status post T10-11 decompression and debridement with thoracolumbar fusion on 03/28/2018. Worsening lower extremity weakness. EXAM: CT CERVICAL, THORACIC, AND LUMBAR SPINE WITH CONTRAST TECHNIQUE: Multidetector CT imaging of the cervical, lumbar, and thoracic spine was performed with intravenous contrast. Multiplanar CT image reconstructions were also generated. CONTRAST:  1105m ISOVUE-300 IOPAMIDOL (ISOVUE-300) INJECTION 61% COMPARISON:  Cervical spine CT 03/22/2018. Thoracic and lumbar spine CT 03/20/2018. FINDINGS: CT CERVICAL SPINE FINDINGS Alignment: Unchanged cervical spine straightening and trace anterolisthesis of C2 on C3. Skull base and vertebrae: No acute fracture. No interval osseous erosion to suggest infectious discitis-osteomyelitis. Solid C3-C6 ACDF. Soft tissues and spinal canal: Unchanged dorsal column stimulator terminating at the C4 level. No gross epidural fluid collection, however artifact from the stimulator and fusion plate and screws limits assessment. No evidence of significant paraspinal or prevertebral inflammatory changes or fluid collection. Disc levels: Unchanged disc and facet degeneration compared to last month's study. Uncovertebral and facet spurring again result in multilevel neural foraminal stenosis, moderate to severe on the left at C2-3 and on the right at C3-4. Upper chest: Partially visualized right PICC. Aortic atherosclerosis. Small right pleural effusion CT THORACIC SPINE FINDINGS Alignment: 4 mm anterolisthesis of T10 on T11, new/increased from prior. Vertebrae: There are new erosive endplate changes at TV40-08without disc space collapse. Right T10-11 facet joint widening is unchanged with slightly increased erosion of the right T10 inferior facet. Interval  T8-L1 posterior fusion has been performed. Pedicle screws appear well-positioned without evidence of loosening. No acute fracture is identified. Paraspinal and other soft tissues: Paravertebral edema/phlegmonous changes from T9-T12 are greater on the right than on the left and are stable to slightly increased. No organized fluid collection is identified. There is a small right pleural effusion, and there is dependent subsegmental atelectasis in both lungs. A right PICC terminates in the lower SVC. Aortic and coronary artery atherosclerosis is noted. A dorsal column stimulator enters the canal at T3-4. Disc levels: Unchanged severe disc space narrowing and degenerative endplate changes at TQ7-6 Interval T10-11 posterior decompression. Limited assessment for an epidural fluid collection due to streak artifact from posterior spinal instrumentation. No gross epidural collection above the fused segment. CT LUMBAR SPINE FINDINGS Segmentation: 5 lumbar type vertebrae. Alignment: Normal. Vertebrae: Prior L1-L3 posterior fusion, extended in the interim into the thoracic spine to the T8 level as described above. Prior interbody fusion from L1-2 to L5-S1. No evidence of screw loosening. Solid ankylosis at each level. No acute fracture. No osseous erosion to suggest active osteomyelitis. Paraspinal and other soft tissues: No significant paraspinal inflammatory changes or fluid collection to suggest infection. Mild aortoiliac atherosclerosis. Disc levels: Unchanged osseous neural foraminal stenosis at multiple levels, moderate on the left at L5-S1 and milder elsewhere as previously described. No gross spinal stenosis. IMPRESSION: 1. New endplate erosion at TP95-09with stable to slightly increased paravertebral inflammation consistent with known discitis-osteomyelitis. Limited assessment for epidural abscess. 2. Mild interval osseous erosion involving the right T10-11 facet joint which may reflect septic arthritis. 3. No findings  to suggest interval development of infection elsewhere in the cervical, thoracic, or lumbar spine. 4. Interval posterior thoracolumbar fusion as above. Electronically Signed   By: ALogan BoresM.D.   On: 05/11/2018 09:18   Ct Lumbar Spine Wo Contrast  Result Date: 05/12/2018 CLINICAL DATA:  Worsening paraplegia.  History of spinal infection. FLUOROSCOPY TIME:  1 minutes and 40  seconds.  Fifteen spot films. PROCEDURE: LUMBAR PUNCTURE FOR CERVICAL LUMBAR AND THORACIC MYELOGRAM CERVICAL AND LUMBAR AND THORACIC MYELOGRAM CT CERVICAL MYELOGRAM CT LUMBAR MYELOGRAM CT THORACIC MYELOGRAM After thorough discussion of risks and benefits of the procedure including bleeding, infection, injury to nerves, blood vessels, adjacent structures as well as headache and CSF leak, written and oral informed consent was obtained. Consent was obtained by Dr. Rolla Flatten. Patient was positioned prone on the fluoroscopy table. Local anesthesia was provided with 1% lidocaine without epinephrine after prepped and draped in the usual sterile fashion. Puncture was performed at L2-L3 using a 3 1/2 inch 20 gauge spinal needle via midline approach. Using a single pass through the dura, the needle was placed within the thecal sac, with return of clear CSF. 10 mL Isovue M-300 was injected into the thecal sac, with normal opacification of the nerve roots and cauda equina consistent with free flow within the subarachnoid space. The patient was then moved to the trendelenburg position and contrast flowed into the Thoracic and Cervical spine regions. I personally performed the lumbar puncture and administered the intrathecal contrast. I also personally supervised acquisition of the myelogram images. TECHNIQUE: Contiguous axial images were obtained through the Cervical, Thoracic, and Lumbar spine after the intrathecal infusion of infusion. Coronal and sagittal reconstructions were obtained of the axial image sets. FINDINGS: TOTAL MYELOGRAM FINDINGS:  Good opacification lumbar subarachnoid space. No stenosis or blockage. Hardware intact. Patient was placed in lateral decubitus and contrast maneuvered into the thoracic region where no block was evident. Patient was further tilted down in contrast maneuvered into the cervical region where no block was present. Dorsal column stimulator leads spanned the cervicothoracic junction. Patient was escorted to CT scanning for further evaluation. CT CERVICAL MYELOGRAM FINDINGS: Alignment: Anatomic except for mild facet mediated slip C2-C3, adjacent segment. Vertebrae: No worrisome osseous lesion.  Solid arthrodesis C3-C6. Cord: Dorsal column stimulator terminating at C4. No cord compression. Paraspinal tissues: Unremarkable. Disc levels: Unchanged disc and facet degeneration compared to last month study. Uncovertebral and facet spurring result in multilevel neural foraminal stenosis moderate to severe on the LEFT at C2-3 and moderate to severe on the RIGHT at C3-4. CT LUMBAR MYELOGRAM FINDINGS: Segmentation: Normal. Alignment:  Normal. Vertebrae: No worrisome osseous lesion.L1-2 S1 fusion appears solid. Conus medullaris: Normal in size and location. Paraspinal tissues: No evidence for hydronephrosis or paravertebral mass. Disc levels: Unchanged osseous neural foraminal stenosis at multiple levels, moderate on the LEFT at L5-S1. No significant spinal stenosis. CT THORACIC MYELOGRAM FINDINGS: Alignment: 4 mm anterolisthesis T10 on T11, new/increased from prior. Vertebrae: Erosive endplate changes at O37-85, without disc space collapse. T8-L1 fusion posteriorly. Pedicle screws in good position. Paraspinal tissues: Phlegmonous changes from T9 through T12 greater on the RIGHT. Small pleural effusion.Cord: No cord compression at any level. No significant ventral epidural process at T10-11. Cord: No cord compression, cord enlargement, or significant ventral epidural process at any level. Disc levels: Unchanged severe disc space  narrowing and degenerative change at T2-T3. Interval T10-T11 posterior decompression. IMPRESSION: Total myelogram with cervical, thoracic and lumbar postmyelogram CT demonstrates no compressive lesion of the cord at any level. Spinal stimulator device, along with multilevel multisegment fusion hardware appear appropriately placed without adverse features. There is probably some residual infection and paravertebral phlegmon at the T10-11 interspace, but there is no ventral epidural hematoma. Electronically Signed   By: Staci Righter M.D.   On: 05/12/2018 17:38   Ct Lumbar Spine W Contrast  Result Date: 05/11/2018 CLINICAL  DATA:  History of discitis-osteomyelitis with epidural abscess at T10-11. Status post T10-11 decompression and debridement with thoracolumbar fusion on 03/28/2018. Worsening lower extremity weakness. EXAM: CT CERVICAL, THORACIC, AND LUMBAR SPINE WITH CONTRAST TECHNIQUE: Multidetector CT imaging of the cervical, lumbar, and thoracic spine was performed with intravenous contrast. Multiplanar CT image reconstructions were also generated. CONTRAST:  168m ISOVUE-300 IOPAMIDOL (ISOVUE-300) INJECTION 61% COMPARISON:  Cervical spine CT 03/22/2018. Thoracic and lumbar spine CT 03/20/2018. FINDINGS: CT CERVICAL SPINE FINDINGS Alignment: Unchanged cervical spine straightening and trace anterolisthesis of C2 on C3. Skull base and vertebrae: No acute fracture. No interval osseous erosion to suggest infectious discitis-osteomyelitis. Solid C3-C6 ACDF. Soft tissues and spinal canal: Unchanged dorsal column stimulator terminating at the C4 level. No gross epidural fluid collection, however artifact from the stimulator and fusion plate and screws limits assessment. No evidence of significant paraspinal or prevertebral inflammatory changes or fluid collection. Disc levels: Unchanged disc and facet degeneration compared to last month's study. Uncovertebral and facet spurring again result in multilevel neural  foraminal stenosis, moderate to severe on the left at C2-3 and on the right at C3-4. Upper chest: Partially visualized right PICC. Aortic atherosclerosis. Small right pleural effusion CT THORACIC SPINE FINDINGS Alignment: 4 mm anterolisthesis of T10 on T11, new/increased from prior. Vertebrae: There are new erosive endplate changes at TE17-40without disc space collapse. Right T10-11 facet joint widening is unchanged with slightly increased erosion of the right T10 inferior facet. Interval T8-L1 posterior fusion has been performed. Pedicle screws appear well-positioned without evidence of loosening. No acute fracture is identified. Paraspinal and other soft tissues: Paravertebral edema/phlegmonous changes from T9-T12 are greater on the right than on the left and are stable to slightly increased. No organized fluid collection is identified. There is a small right pleural effusion, and there is dependent subsegmental atelectasis in both lungs. A right PICC terminates in the lower SVC. Aortic and coronary artery atherosclerosis is noted. A dorsal column stimulator enters the canal at T3-4. Disc levels: Unchanged severe disc space narrowing and degenerative endplate changes at TC1-4 Interval T10-11 posterior decompression. Limited assessment for an epidural fluid collection due to streak artifact from posterior spinal instrumentation. No gross epidural collection above the fused segment. CT LUMBAR SPINE FINDINGS Segmentation: 5 lumbar type vertebrae. Alignment: Normal. Vertebrae: Prior L1-L3 posterior fusion, extended in the interim into the thoracic spine to the T8 level as described above. Prior interbody fusion from L1-2 to L5-S1. No evidence of screw loosening. Solid ankylosis at each level. No acute fracture. No osseous erosion to suggest active osteomyelitis. Paraspinal and other soft tissues: No significant paraspinal inflammatory changes or fluid collection to suggest infection. Mild aortoiliac atherosclerosis.  Disc levels: Unchanged osseous neural foraminal stenosis at multiple levels, moderate on the left at L5-S1 and milder elsewhere as previously described. No gross spinal stenosis. IMPRESSION: 1. New endplate erosion at TG81-85with stable to slightly increased paravertebral inflammation consistent with known discitis-osteomyelitis. Limited assessment for epidural abscess. 2. Mild interval osseous erosion involving the right T10-11 facet joint which may reflect septic arthritis. 3. No findings to suggest interval development of infection elsewhere in the cervical, thoracic, or lumbar spine. 4. Interval posterior thoracolumbar fusion as above. Electronically Signed   By: ALogan BoresM.D.   On: 05/11/2018 09:18   Ct Knee Right Wo Contrast  Result Date: 05/17/2018 CLINICAL DATA:  Status post fall.  Right knee pain. EXAM: CT OF THE RIGHT KNEE WITHOUT CONTRAST TECHNIQUE: Multidetector CT imaging of the RIGHT knee was  performed according to the standard protocol. Multiplanar CT image reconstructions were also generated. COMPARISON:  None. FINDINGS: Bones/Joint/Cartilage Generalized osteopenia. No acute fracture or dislocation. Mild tricompartmental osteoarthritis of right knee. Chondrocalcinosis of the medial and lateral femorotibial compartments as can be seen with CPPD. Large high attenuation joint effusion consistent with hemarthrosis with a small amount air within the joint fluid concerning for septic arthritis in the absence of instrumentation. Ligaments Suboptimally assessed by CT. Muscles and Tendons Muscles are normal. No muscle atrophy. Quadriceps tendon and patellar tendon are intact. Soft tissues Soft tissue edema in the subcutaneous fat along the anterolateral knee. No focal fluid collection or hematoma. IMPRESSION: 1. Large high attenuation joint effusion consistent with hemarthrosis with a small amount air within the joint fluid concerning for septic arthritis in the absence of instrumentation. 2.  No  acute osseous injury of the right knee. 3. Tricompartmental osteoarthritis of the right knee. Electronically Signed   By: Kathreen Devoid   On: 05/17/2018 20:05   Dg Chest Portable 1 View  Result Date: 05/08/2018 CLINICAL DATA:  Verify PICC line placement. EXAM: PORTABLE CHEST 1 VIEW COMPARISON:  Apr 25, 2018 FINDINGS: A right PICC line is identified. The distal tip is difficult to visualize but appears to terminate in the central SVC. No change in the cardiomediastinal silhouette. No pneumothorax. No other acute interval changes. IMPRESSION: The right PICC line appears to be in good position. The distal tip is difficult to visualize with confidence but appears to be in the central SVC. Electronically Signed   By: Dorise Bullion III M.D   On: 05/08/2018 16:03   Dg Myelography Lumbar Inj Thoracic  Result Date: 05/12/2018 CLINICAL DATA:  Worsening paraplegia.  History of spinal infection. FLUOROSCOPY TIME:  1 minutes and 40 seconds.  Fifteen spot films. PROCEDURE: LUMBAR PUNCTURE FOR CERVICAL LUMBAR AND THORACIC MYELOGRAM CERVICAL AND LUMBAR AND THORACIC MYELOGRAM CT CERVICAL MYELOGRAM CT LUMBAR MYELOGRAM CT THORACIC MYELOGRAM After thorough discussion of risks and benefits of the procedure including bleeding, infection, injury to nerves, blood vessels, adjacent structures as well as headache and CSF leak, written and oral informed consent was obtained. Consent was obtained by Dr. Rolla Flatten. Patient was positioned prone on the fluoroscopy table. Local anesthesia was provided with 1% lidocaine without epinephrine after prepped and draped in the usual sterile fashion. Puncture was performed at L2-L3 using a 3 1/2 inch 20 gauge spinal needle via midline approach. Using a single pass through the dura, the needle was placed within the thecal sac, with return of clear CSF. 10 mL Isovue M-300 was injected into the thecal sac, with normal opacification of the nerve roots and cauda equina consistent with free flow  within the subarachnoid space. The patient was then moved to the trendelenburg position and contrast flowed into the Thoracic and Cervical spine regions. I personally performed the lumbar puncture and administered the intrathecal contrast. I also personally supervised acquisition of the myelogram images. TECHNIQUE: Contiguous axial images were obtained through the Cervical, Thoracic, and Lumbar spine after the intrathecal infusion of infusion. Coronal and sagittal reconstructions were obtained of the axial image sets. FINDINGS: TOTAL MYELOGRAM FINDINGS: Good opacification lumbar subarachnoid space. No stenosis or blockage. Hardware intact. Patient was placed in lateral decubitus and contrast maneuvered into the thoracic region where no block was evident. Patient was further tilted down in contrast maneuvered into the cervical region where no block was present. Dorsal column stimulator leads spanned the cervicothoracic junction. Patient was escorted to CT scanning for  further evaluation. CT CERVICAL MYELOGRAM FINDINGS: Alignment: Anatomic except for mild facet mediated slip C2-C3, adjacent segment. Vertebrae: No worrisome osseous lesion.  Solid arthrodesis C3-C6. Cord: Dorsal column stimulator terminating at C4. No cord compression. Paraspinal tissues: Unremarkable. Disc levels: Unchanged disc and facet degeneration compared to last month study. Uncovertebral and facet spurring result in multilevel neural foraminal stenosis moderate to severe on the LEFT at C2-3 and moderate to severe on the RIGHT at C3-4. CT LUMBAR MYELOGRAM FINDINGS: Segmentation: Normal. Alignment:  Normal. Vertebrae: No worrisome osseous lesion.L1-2 S1 fusion appears solid. Conus medullaris: Normal in size and location. Paraspinal tissues: No evidence for hydronephrosis or paravertebral mass. Disc levels: Unchanged osseous neural foraminal stenosis at multiple levels, moderate on the LEFT at L5-S1. No significant spinal stenosis. CT THORACIC  MYELOGRAM FINDINGS: Alignment: 4 mm anterolisthesis T10 on T11, new/increased from prior. Vertebrae: Erosive endplate changes at Q46-96, without disc space collapse. T8-L1 fusion posteriorly. Pedicle screws in good position. Paraspinal tissues: Phlegmonous changes from T9 through T12 greater on the RIGHT. Small pleural effusion.Cord: No cord compression at any level. No significant ventral epidural process at T10-11. Cord: No cord compression, cord enlargement, or significant ventral epidural process at any level. Disc levels: Unchanged severe disc space narrowing and degenerative change at T2-T3. Interval T10-T11 posterior decompression. IMPRESSION: Total myelogram with cervical, thoracic and lumbar postmyelogram CT demonstrates no compressive lesion of the cord at any level. Spinal stimulator device, along with multilevel multisegment fusion hardware appear appropriately placed without adverse features. There is probably some residual infection and paravertebral phlegmon at the T10-11 interspace, but there is no ventral epidural hematoma. Electronically Signed   By: Staci Righter M.D.   On: 05/12/2018 17:38   Dg Hip Unilat With Pelvis 2-3 Views Right  Result Date: 05/08/2018 CLINICAL DATA:  Fall, right hip pain EXAM: DG HIP (WITH OR WITHOUT PELVIS) 2-3V RIGHT COMPARISON:  None. FINDINGS: No fracture or dislocation is seen. Bilateral hip joint spaces are preserved. Visualized bony pelvis appears intact. Degenerative changes at L5-S1. IMPRESSION: Negative. Electronically Signed   By: Julian Hy M.D.   On: 05/08/2018 16:53    DISCHARGE EXAMINATION: Vitals:   05/21/18 2037 05/21/18 2325 05/22/18 0328 05/22/18 0854  BP: (!) 142/80 131/82 130/86 (!) 145/91  Pulse: 79 82 80 73  Resp: '18 18 18 20  ' Temp: 98.4 F (36.9 C) 98.8 F (37.1 C) 98.5 F (36.9 C) 98.1 F (36.7 C)  TempSrc: Oral Axillary Oral Oral  SpO2: 95% 99% 100% 98%  Weight:   129.5 kg (285 lb 7.9 oz)   Height:       General  appearance: alert, cooperative, appears stated age and no distress Resp: clear to auscultation bilaterally Cardio: regular rate and rhythm, S1, S2 normal, no murmur, click, rub or gallop GI: soft, non-tender; bowel sounds normal; no masses,  no organomegaly Extremities: Improved mobility of the right lower extremity and knee.  DISPOSITION: Inpatient rehab at [facility] Instructions    Call MD for:  extreme fatigue   Complete by:  As directed    Call MD for:  persistant dizziness or light-headedness   Complete by:  As directed    Call MD for:  persistant nausea and vomiting   Complete by:  As directed    Call MD for:  severe uncontrolled pain   Complete by:  As directed    Call MD for:  temperature >100.4   Complete by:  As directed    Discharge instructions  Complete by:  As directed    Please review instructions on the discharge summary.  You were cared for by a hospitalist during your hospital stay. If you have any questions about your discharge medications or the care you received while you were in the hospital after you are discharged, you can call the unit and asked to speak with the hospitalist on call if the hospitalist that took care of you is not available. Once you are discharged, your primary care physician will handle any further medical issues. Please note that NO REFILLS for any discharge medications will be authorized once you are discharged, as it is imperative that you return to your primary care physician (or establish a relationship with a primary care physician if you do not have one) for your aftercare needs so that they can reassess your need for medications and monitor your lab values. If you do not have a primary care physician, you can call [phone] for a physician referral.   Increase activity slowly   Complete by:  As directed          Allergies as of 05/22/2018   No Known Allergies     Medication List    STOP taking these medications     lidocaine 5 % Commonly known as:  LIDODERM   methocarbamol 500 MG tablet Commonly known as:  ROBAXIN     TAKE these medications   amLODipine 10 MG tablet Commonly known as:  NORVASC Take 1 tablet (10 mg total) by mouth daily.   aspirin 81 MG tablet Take 1 tablet (81 mg total) by mouth daily.   Baclofen 5 MG Tabs Take 5 mg by mouth 3 (three) times daily. What changed:    when to take this  reasons to take this   ceFAZolin IVPB Commonly known as:  ANCEF Inject 2 g into the vein every 8 (eight) hours for 27 days. 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 Start taking on:  05/23/2018   cycloSPORINE 0.05 % ophthalmic emulsion Commonly known as:  RESTASIS 1 DROP INTO BOTH EYES TWICE A DAY FOR DRY EYES   diazepam 5 MG tablet Commonly known as:  VALIUM Take 1 tablet (5 mg total) by mouth every 6 (six) hours as needed for muscle spasms.   docusate sodium 100 MG capsule Commonly known as:  COLACE Take 2 capsules (200 mg total) by mouth 2 (two) times daily.   donepezil 10 MG tablet Commonly known as:  ARICEPT Take 1 tablet (10 mg total) by mouth at bedtime.   enoxaparin 150 MG/ML injection Commonly known as:  LOVENOX Inject 0.87 mLs (130 mg total) into the skin every 12 (twelve) hours.   finasteride 5 MG tablet Commonly known as:  PROSCAR Take 1 tablet (5 mg total) by mouth daily. Reported on 05/04/2016   fluticasone 50 MCG/ACT nasal spray Commonly known as:  FLONASE Place 1 spray into both nostrils daily as needed for allergies.   gabapentin 400 MG capsule Commonly known as:  NEURONTIN Take 1 capsule (400 mg total) by mouth 3 (three) times daily. What changed:    medication strength  how much to take    latanoprost 0.005 % ophthalmic solution Commonly known as:  Ivin Poot  Place 1 drop into both eyes at bedtime.   linaclotide 290 MCG Caps capsule Commonly known as:  LINZESS Take 1 capsule (290 mcg total) by mouth daily before breakfast.   lisinopril 5 MG tablet Commonly known as:  PRINIVIL,ZESTRIL Take 1 tablet (5 mg total) by mouth daily.   Melatonin 3 MG Tabs Take 0.5 tablets (1.5 mg total) by mouth at bedtime.   memantine 10 MG tablet Commonly known as:  NAMENDA Take 1 tablet (10 mg total) by mouth 2 (two) times daily.   omeprazole 20 MG capsule Commonly known as:  PRILOSEC Take 2 capsules (40 mg total) by mouth daily.   Oxycodone HCl 10 MG Tabs Take 1 tablet (10 mg total) by mouth every 4 (four) hours as needed (moderate to severe pain). What changed:  reasons to take this   polyethylene glycol packet Commonly known as:  MIRALAX / GLYCOLAX Take 17 g by mouth daily. Start taking on:  05/23/2018   potassium chloride SA 20 MEQ tablet Commonly known as:  K-DUR,KLOR-CON Take 1 tablet (20 mEq total) by mouth daily.   pravastatin 10 MG tablet Commonly known as:  PRAVACHOL Take 1 tablet (10 mg total) by mouth every evening.   predniSONE 20 MG tablet Commonly known as:  DELTASONE Take 1 tablet (20 mg total) by mouth daily with breakfast. For 3 more days and then stop. Start taking on:  05/23/2018   rifampin 300 MG capsule Commonly known as:  RIFADIN Take 1 capsule (300 mg total) by mouth every 12 (twelve) hours. Continue through 06/18/2018 and then to be determined by ID clinic What changed:    how much to take  how to take this  when to take this  additional instructions   traZODone 100 MG tablet Commonly known as:  DESYREL Take 1 tablet (100 mg total) by mouth at bedtime.   venlafaxine XR 150 MG 24 hr capsule Commonly known as:  EFFEXOR-XR Take 1 capsule (150 mg total) by mouth 2 (two) times daily.   vitamin B-12 1000 MCG tablet Commonly known as:   CYANOCOBALAMIN Take 1 tablet (1,000 mcg total) by mouth daily.   warfarin 5 MG tablet Commonly known as:  COUMADIN 64m x 1 on 05/22/18 and then PT/INR on 05/23/18 and then to determine further dose. What changed:  additional instructions        Follow-up Information    LMarchia Bond MD. Schedule an appointment as soon as possible for a visit in 1 week.   Specialty:  Orthopedic Surgery Contact information: [phone]        EKristeen Miss MD. Schedule an appointment as soon as possible for a visit in 4 week(s).   Specialty:  Neurosurgery Contact information: [address] [phone]        SCarlyle Basques MD. Schedule an appointment as soon as possible for a visit in 2 week(s).   Specialty:  Infectious Diseases Contact information: [address] [place] [phone]           TOTAL DISCHARGE TIME: 35 mins  [facility] Pager [phone] 05/22/2018, 12:11 PM

## 2018-05-22 NOTE — Plan of Care (Signed)
Adequate for discharge.

## 2018-05-22 NOTE — Progress Notes (Signed)
Patient left with picc line in rt upper arm for long term abx

## 2018-06-06 ENCOUNTER — Ambulatory Visit (INDEPENDENT_AMBULATORY_CARE_PROVIDER_SITE_OTHER): Payer: Medicare Other | Admitting: Internal Medicine

## 2018-06-06 ENCOUNTER — Telehealth: Payer: Self-pay

## 2018-06-06 DIAGNOSIS — M4644 Discitis, unspecified, thoracic region: Secondary | ICD-10-CM

## 2018-06-06 MED ORDER — RIFAMPIN 300 MG PO CAPS: 300.0000 mg | ORAL_CAPSULE | Freq: Two times a day (BID) | ORAL | 3 refills | Status: DC

## 2018-06-06 MED ORDER — CEPHALEXIN 500 MG PO CAPS: 500.0000 mg | ORAL_CAPSULE | Freq: Three times a day (TID) | ORAL | 3 refills | Status: DC

## 2018-06-06 NOTE — Progress Notes (Signed)
[facility] for Infectious Disease  Patient Active Problem List   Diagnosis Date Noted  . Pseudogout of knee     Priority: High  . Discitis of thoracic region     Priority: High  . Bacteremia due to methicillin susceptible Staphylococcus aureus (MSSA) 03/18/2018    Priority: High  . Pyogenic arthritis of right knee joint ([place])   . Falls frequently 05/08/2018  . Fall   . Sleep disturbance   . Post-op pain   . Hypokalemia   . Morbid obesity ([place])   . Postoperative pain   . DVT, lower extremity, distal, acute, bilateral ([place])   . Acute blood loss anemia   . Anemia of chronic disease   . Essential hypertension   . Bacteremia   . Myelopathy ([place]) 03/30/2018  . Paraplegia ([place])   . Postlaminectomy syndrome, lumbar region   . Epidural abscess   . Abdominal distension   . Encephalopathy   . Weakness of both lower extremities   . Spinal cord stimulator status   . Staphylococcus aureus sepsis ([place]) 03/20/2018  . Obesity 03/18/2018  . Nephrolithiasis 03/16/2018  . AKI (acute kidney injury) ([place]) 03/16/2018  . Cognitive impairment 03/16/2018  . Chronic pain syndrome 03/16/2018  . B12 deficiency 01/28/2016  . Memory difficulty 07/23/2015  . History of cerebrovascular disease 07/23/2015  . FH: colon cancer 08/13/2014  . Hx of adenomatous colonic polyps 08/13/2014  . Chronic back pain 05/30/2012  . CAD, NATIVE VESSEL 07/17/2010  . HYPERCHOLESTEROLEMIA 10/31/2009  . SMOKELESS TOBACCO ABUSE 10/31/2009  . Obstructive sleep apnea 10/31/2009  . Essential hypertension, benign 10/31/2009  . GERD 10/31/2009    Patient's Medications  New Prescriptions   CEPHALEXIN (KEFLEX) 500 MG CAPSULE    Take 1 capsule (500 mg total) by mouth 3 (three) times daily.  Previous Medications   AMLODIPINE (NORVASC) 10 MG TABLET    Take 1 tablet (10 mg total) by mouth daily.   ASPIRIN 81 MG TABLET    Take 1 tablet (81 mg total) by mouth daily.   BACLOFEN 5 MG TABS    Take 5 mg by mouth  3 (three) times daily.   COLCHICINE 0.6 MG TABLET    Take 1 tablet (0.6 mg total) by mouth daily.   CYCLOSPORINE (RESTASIS) 0.05 % OPHTHALMIC EMULSION    1 DROP INTO BOTH EYES TWICE A DAY FOR DRY EYES   DIAZEPAM (VALIUM) 5 MG TABLET    Take 1 tablet (5 mg total) by mouth every 6 (six) hours as needed for muscle spasms.   DOCUSATE SODIUM (COLACE) 100 MG CAPSULE    Take 2 capsules (200 mg total) by mouth 2 (two) times daily.   DONEPEZIL (ARICEPT) 10 MG TABLET    Take 1 tablet (10 mg total) by mouth at bedtime.   ENOXAPARIN (LOVENOX) 150 MG/ML INJECTION    Inject 0.87 mLs (130 mg total) into the skin every 12 (twelve) hours.   FINASTERIDE (PROSCAR) 5 MG TABLET    Take 1 tablet (5 mg total) by mouth daily. Reported on 05/04/2016   FLUTICASONE (FLONASE) 50 MCG/ACT NASAL SPRAY    Place 1 spray into both nostrils daily as needed for allergies.   GABAPENTIN (NEURONTIN) 400 MG CAPSULE    Take 1 capsule (400 mg total) by mouth 3 (three) times daily.   LATANOPROST (XALATAN) 0.005 % OPHTHALMIC SOLUTION    Place 1 drop into both eyes at bedtime.   LINACLOTIDE (LINZESS) 290 MCG  CAPS CAPSULE    Take 1 capsule (290 mcg total) by mouth daily before breakfast.   LISINOPRIL (PRINIVIL,ZESTRIL) 5 MG TABLET    Take 1 tablet (5 mg total) by mouth daily.   MELATONIN 3 MG TABS    Take 0.5 tablets (1.5 mg total) by mouth at bedtime.   MEMANTINE (NAMENDA) 10 MG TABLET    Take 1 tablet (10 mg total) by mouth 2 (two) times daily.   OMEPRAZOLE (PRILOSEC) 20 MG CAPSULE    Take 2 capsules (40 mg total) by mouth daily.   OXYCODONE HCL 10 MG TABS    Take 1 tablet (10 mg total) by mouth every 4 (four) hours as needed (moderate to severe pain).   POLYETHYLENE GLYCOL (MIRALAX / GLYCOLAX) PACKET    Take 17 g by mouth daily.   POTASSIUM CHLORIDE SA (K-DUR,KLOR-CON) 20 MEQ TABLET    Take 1 tablet (20 mEq total) by mouth daily.   PRAVASTATIN (PRAVACHOL) 10 MG TABLET    Take 1 tablet (10 mg total) by mouth every evening.   PREDNISONE  (DELTASONE) 20 MG TABLET    Take 1 tablet (20 mg total) by mouth daily with breakfast. For 3 more days and then stop.   TRAZODONE (DESYREL) 100 MG TABLET    Take 1 tablet (100 mg total) by mouth at bedtime.   VENLAFAXINE XR (EFFEXOR-XR) 150 MG 24 HR CAPSULE    Take 1 capsule (150 mg total) by mouth 2 (two) times daily.   VITAMIN B-12 (CYANOCOBALAMIN) 1000 MCG TABLET    Take 1 tablet (1,000 mcg total) by mouth daily.   WARFARIN (COUMADIN) 5 MG TABLET    80m x 1 on 05/22/18 and then PT/INR on 05/23/18 and then to determine further dose.  Modified Medications   Modified Medication Previous Medication   RIFAMPIN (RIFADIN) 300 MG CAPSULE rifampin (RIFADIN) 300 MG capsule      Take 1 capsule (300 mg total) by mouth every 12 (twelve) hours. Continue through 06/18/2018 and then to be determined by ID clinic    Take 1 capsule (300 mg total) by mouth every 12 (twelve) hours. Continue through 06/18/2018 and then to be determined by ID clinic  Discontinued Medications   CEFAZOLIN (ANCEF) IVPB    Inject 2 g into the vein every 8 (eight) hours for 27 days. Indication:  MSSA Bacteremia and extensive epidural abscess discitis of thoracic spine Last Day of Therapy: 06/18/2018 Labs - Once weekly:  CBC/D and CMP, Labs - Every other week:  ESR and CRP    Subjective: Mr. RGrasseis in for his routine hospital follow-up.  He is accompanied by his wife.  [facility] him MSSA bacteremia.  This was complicated by thoracic discitis at the T10-11 level.  [facility].  The initial plan was 8 weeks of IV cefazolin and oral rifampin but he was readmitted to the hospital on 04/21/2018 with bilateral knee pain.  He was found to have pseudogout but there was concern for possibly having septic arthritis although Gram stain and cultures were negative.  My partner, Dr. CKarolee Ohsrecommended extending  IV-based therapy until 06/18/2018.  He remains in inpatient rehab until the end of this week.  He is feeling much better.  He has a little recall of his hospitalizations.  He has had no problems tolerating his antibiotics but he is having a  lot of itching and irritation around his PICC site.  He says that he would very much like to get the PICC line out as soon as possible.  Review of Systems: Review of Systems  Constitutional: Negative for chills, diaphoresis and fever.  Gastrointestinal: Positive for constipation. Negative for abdominal pain, diarrhea, nausea and vomiting.  Musculoskeletal: Positive for back pain and joint pain.    Past Medical History:  Diagnosis Date  . Allergic rhinitis   . Arthritis   . Asthma    as a child  . B12 deficiency   . Cancer [facility])    prostate  . Cervicogenic headache 01/28/2016  . Chronic back pain   . Coronary atherosclerosis of native coronary artery    Mild nonobstructive CAD at catheterization January 2015  . Depression   . Essential hypertension, benign   . Falls   . GERD (gastroesophageal reflux disease)   . History of cerebrovascular disease 07/23/2015  . History of pneumonia 02/2011  . Hyperlipidemia   . Kidney stone   . Memory difficulty 07/23/2015  . OSA (obstructive sleep apnea)    CPAP - Dr. [name]  . Prostate cancer ([place])   . PTSD (post-traumatic stress disorder)    [place]  . Rectal bleeding     Social History   Tobacco Use  . Smoking status: Former Smoker    Types: Cigarettes    Last attempt to quit: 12/20/1958    Years since quitting: 59.5  . Smokeless tobacco: Former Systems developer    Types: Chew  . Tobacco comment: Quit chewing 04/2015  Substance Use Topics  . Alcohol use: Yes    Alcohol/week: 0.0 oz    Comment: couple bottles of wine per month  . Drug use: No    Types: Marijuana    Comment: "last used marijuana ~ 1969"    Family History  Problem Relation Age of Onset  . Emphysema Father   . Heart failure Father   . Lung  cancer Father   . CAD Father   . Colon cancer Mother   . Stroke Mother   . Breast cancer Mother   . Stroke Sister   . Heart attack Brother   . Dementia Paternal Uncle   . Emphysema Maternal Grandmother   . Stroke Maternal Grandmother   . Asthma Other        grandson  . Heart disease Paternal Grandfather   . Anesthesia problems Neg Hx   . Hypotension Neg Hx   . Malignant hyperthermia Neg Hx   . Pseudochol deficiency Neg Hx     No Known Allergies  Objective: Vitals:   06/06/18 0912  BP: 127/80  Pulse: (!) 102  Temp: 98.3 F (36.8 C)  TempSrc: Oral   There is no height or weight on file to calculate BMI.  Physical Exam  Constitutional: He is oriented to person, place, and time.  He is in good spirits.  He is seated in his wheelchair.  Cardiovascular: Normal rate, regular rhythm and normal heart sounds.  No murmur heard. Pulmonary/Chest: Effort normal and breath sounds normal.  Musculoskeletal:  He has extensive, well-healing incisions over his spine and left flank spinal cord stimulator.  No unusual inflammation is noted.  He has moderate swelling of his right knee with healing incisions.  There is no unusual erythema or warmth.  He has good range of motion with minimal pain.  Neurological: He is alert and oriented to person, place, and time.  Skin: No rash noted.  Right  arm PICC site looks good.  Psychiatric: He has a normal mood and affect.    Lab Results Creatinine 06/05/2018: 0.76 mg per DL Sed Rate (mm/hr)  Date Value  05/11/2018 125 (H)  03/21/2018 94 (H)   CRP (mg/dL)  Date Value  05/11/2018 22.0 (H)  03/21/2018 17.4 (H)     Problem List Items Addressed This Visit      High   Discitis of thoracic region    He is doing much better 2-1/2 months into antibiotic therapy for MSSA bacteremia complicated by thoracic spine infection.  I doubt that he had septic arthritis superimposed upon his recent pseudogout.  I talked to him about management options  including stopping antibiotics now, switching to oral antibiotics or continuing IV-based therapy until the end of the month and then switching to oral antibiotics.  He is desperate to get his PICC line out so we will switch to oral cephalexin and continue rifampin now.  He will have lab work today to recheck his inflammatory markers.  He will come back in 6 weeks.      Relevant Medications   cephALEXin (KEFLEX) 500 MG capsule   Other Relevant Orders   C-reactive protein   Sedimentation rate       Michel Bickers, MD [facility] for Infectious [place] [phone] pager   [phone] cell 06/06/2018, 10:11 AM

## 2018-06-06 NOTE — Telephone Encounter (Signed)
[facility] in [place], [place] per Dr. Megan Salon to see if anyone in the facility would be able to have pt's picc line removed or if pt would need to be seen at [facility]. Spoke with the Charge nurse who stated they could pull the picc line once the Mds at their office put an order in. Informed Dr. Megan Salon he is okay with this and has written in his note for pt to have picc pulled today. [place]

## 2018-06-06 NOTE — Assessment & Plan Note (Signed)
He is doing much better 2-1/2 months into antibiotic therapy for MSSA bacteremia complicated by thoracic spine infection.  I doubt that he had septic arthritis superimposed upon his recent pseudogout.  I talked to him about management options including stopping antibiotics now, switching to oral antibiotics or continuing IV-based therapy until the end of the month and then switching to oral antibiotics.  He is desperate to get his PICC line out so we will switch to oral cephalexin and continue rifampin now.  He will have lab work today to recheck his inflammatory markers.  He will come back in 6 weeks.

## 2018-06-07 LAB — C-REACTIVE PROTEIN: CRP: 19.5 mg/L — ABNORMAL HIGH (ref <8.0)

## 2018-06-07 LAB — SEDIMENTATION RATE: Sed Rate: 72 mm/h — ABNORMAL HIGH (ref 0–20)

## 2018-06-09 DIAGNOSIS — R7881 Bacteremia: Secondary | ICD-10-CM | POA: Diagnosis not present

## 2018-06-09 DIAGNOSIS — M4624 Osteomyelitis of vertebra, thoracic region: Secondary | ICD-10-CM | POA: Diagnosis not present

## 2018-06-09 DIAGNOSIS — G8220 Paraplegia, unspecified: Secondary | ICD-10-CM | POA: Diagnosis not present

## 2018-06-09 DIAGNOSIS — B9561 Methicillin susceptible Staphylococcus aureus infection as the cause of diseases classified elsewhere: Secondary | ICD-10-CM | POA: Diagnosis not present

## 2018-06-09 DIAGNOSIS — G061 Intraspinal abscess and granuloma: Secondary | ICD-10-CM | POA: Diagnosis not present

## 2018-06-09 DIAGNOSIS — G9589 Other specified diseases of spinal cord: Secondary | ICD-10-CM | POA: Diagnosis not present

## 2018-06-12 ENCOUNTER — Other Ambulatory Visit: Payer: Self-pay | Admitting: Neurology

## 2018-06-13 DIAGNOSIS — M4624 Osteomyelitis of vertebra, thoracic region: Secondary | ICD-10-CM | POA: Diagnosis not present

## 2018-06-13 DIAGNOSIS — G8220 Paraplegia, unspecified: Secondary | ICD-10-CM | POA: Diagnosis not present

## 2018-06-13 DIAGNOSIS — B9561 Methicillin susceptible Staphylococcus aureus infection as the cause of diseases classified elsewhere: Secondary | ICD-10-CM | POA: Diagnosis not present

## 2018-06-13 DIAGNOSIS — G9589 Other specified diseases of spinal cord: Secondary | ICD-10-CM | POA: Diagnosis not present

## 2018-06-13 DIAGNOSIS — R7881 Bacteremia: Secondary | ICD-10-CM | POA: Diagnosis not present

## 2018-06-13 DIAGNOSIS — G061 Intraspinal abscess and granuloma: Secondary | ICD-10-CM | POA: Diagnosis not present

## 2018-06-14 ENCOUNTER — Encounter: Payer: Self-pay | Admitting: Internal Medicine

## 2018-06-14 DIAGNOSIS — M545 Low back pain: Secondary | ICD-10-CM | POA: Diagnosis not present

## 2018-06-14 DIAGNOSIS — G061 Intraspinal abscess and granuloma: Secondary | ICD-10-CM | POA: Diagnosis not present

## 2018-06-15 DIAGNOSIS — M4624 Osteomyelitis of vertebra, thoracic region: Secondary | ICD-10-CM | POA: Diagnosis not present

## 2018-06-15 DIAGNOSIS — B9561 Methicillin susceptible Staphylococcus aureus infection as the cause of diseases classified elsewhere: Secondary | ICD-10-CM | POA: Diagnosis not present

## 2018-06-15 DIAGNOSIS — M11261 Other chondrocalcinosis, right knee: Secondary | ICD-10-CM | POA: Diagnosis not present

## 2018-06-15 DIAGNOSIS — G8220 Paraplegia, unspecified: Secondary | ICD-10-CM | POA: Diagnosis not present

## 2018-06-15 DIAGNOSIS — G061 Intraspinal abscess and granuloma: Secondary | ICD-10-CM | POA: Diagnosis not present

## 2018-06-15 DIAGNOSIS — I82409 Acute embolism and thrombosis of unspecified deep veins of unspecified lower extremity: Secondary | ICD-10-CM | POA: Diagnosis not present

## 2018-06-15 DIAGNOSIS — G9589 Other specified diseases of spinal cord: Secondary | ICD-10-CM | POA: Diagnosis not present

## 2018-06-15 DIAGNOSIS — R7881 Bacteremia: Secondary | ICD-10-CM | POA: Diagnosis not present

## 2018-06-16 DIAGNOSIS — B9561 Methicillin susceptible Staphylococcus aureus infection as the cause of diseases classified elsewhere: Secondary | ICD-10-CM | POA: Diagnosis not present

## 2018-06-16 DIAGNOSIS — G061 Intraspinal abscess and granuloma: Secondary | ICD-10-CM | POA: Diagnosis not present

## 2018-06-16 DIAGNOSIS — M4624 Osteomyelitis of vertebra, thoracic region: Secondary | ICD-10-CM | POA: Diagnosis not present

## 2018-06-16 DIAGNOSIS — G8220 Paraplegia, unspecified: Secondary | ICD-10-CM | POA: Diagnosis not present

## 2018-06-16 DIAGNOSIS — R7881 Bacteremia: Secondary | ICD-10-CM | POA: Diagnosis not present

## 2018-06-16 DIAGNOSIS — G9589 Other specified diseases of spinal cord: Secondary | ICD-10-CM | POA: Diagnosis not present

## 2018-06-19 DIAGNOSIS — G9589 Other specified diseases of spinal cord: Secondary | ICD-10-CM | POA: Diagnosis not present

## 2018-06-19 DIAGNOSIS — G061 Intraspinal abscess and granuloma: Secondary | ICD-10-CM | POA: Diagnosis not present

## 2018-06-19 DIAGNOSIS — R7881 Bacteremia: Secondary | ICD-10-CM | POA: Diagnosis not present

## 2018-06-19 DIAGNOSIS — G8220 Paraplegia, unspecified: Secondary | ICD-10-CM | POA: Diagnosis not present

## 2018-06-19 DIAGNOSIS — M4624 Osteomyelitis of vertebra, thoracic region: Secondary | ICD-10-CM | POA: Diagnosis not present

## 2018-06-19 DIAGNOSIS — B9561 Methicillin susceptible Staphylococcus aureus infection as the cause of diseases classified elsewhere: Secondary | ICD-10-CM | POA: Diagnosis not present

## 2018-06-21 DIAGNOSIS — G9589 Other specified diseases of spinal cord: Secondary | ICD-10-CM | POA: Diagnosis not present

## 2018-06-21 DIAGNOSIS — R7881 Bacteremia: Secondary | ICD-10-CM | POA: Diagnosis not present

## 2018-06-21 DIAGNOSIS — G8220 Paraplegia, unspecified: Secondary | ICD-10-CM | POA: Diagnosis not present

## 2018-06-21 DIAGNOSIS — B9561 Methicillin susceptible Staphylococcus aureus infection as the cause of diseases classified elsewhere: Secondary | ICD-10-CM | POA: Diagnosis not present

## 2018-06-21 DIAGNOSIS — M4624 Osteomyelitis of vertebra, thoracic region: Secondary | ICD-10-CM | POA: Diagnosis not present

## 2018-06-21 DIAGNOSIS — G061 Intraspinal abscess and granuloma: Secondary | ICD-10-CM | POA: Diagnosis not present

## 2018-06-23 DIAGNOSIS — R7881 Bacteremia: Secondary | ICD-10-CM | POA: Diagnosis not present

## 2018-06-23 DIAGNOSIS — B9561 Methicillin susceptible Staphylococcus aureus infection as the cause of diseases classified elsewhere: Secondary | ICD-10-CM | POA: Diagnosis not present

## 2018-06-23 DIAGNOSIS — M4624 Osteomyelitis of vertebra, thoracic region: Secondary | ICD-10-CM | POA: Diagnosis not present

## 2018-06-23 DIAGNOSIS — G9589 Other specified diseases of spinal cord: Secondary | ICD-10-CM | POA: Diagnosis not present

## 2018-06-23 DIAGNOSIS — G061 Intraspinal abscess and granuloma: Secondary | ICD-10-CM | POA: Diagnosis not present

## 2018-06-23 DIAGNOSIS — G8220 Paraplegia, unspecified: Secondary | ICD-10-CM | POA: Diagnosis not present

## 2018-06-26 DIAGNOSIS — G9589 Other specified diseases of spinal cord: Secondary | ICD-10-CM | POA: Diagnosis not present

## 2018-06-26 DIAGNOSIS — M25561 Pain in right knee: Secondary | ICD-10-CM | POA: Diagnosis not present

## 2018-06-26 DIAGNOSIS — B9561 Methicillin susceptible Staphylococcus aureus infection as the cause of diseases classified elsewhere: Secondary | ICD-10-CM | POA: Diagnosis not present

## 2018-06-26 DIAGNOSIS — M4624 Osteomyelitis of vertebra, thoracic region: Secondary | ICD-10-CM | POA: Diagnosis not present

## 2018-06-26 DIAGNOSIS — G061 Intraspinal abscess and granuloma: Secondary | ICD-10-CM | POA: Diagnosis not present

## 2018-06-26 DIAGNOSIS — R7881 Bacteremia: Secondary | ICD-10-CM | POA: Diagnosis not present

## 2018-06-26 DIAGNOSIS — G8220 Paraplegia, unspecified: Secondary | ICD-10-CM | POA: Diagnosis not present

## 2018-06-27 DIAGNOSIS — G061 Intraspinal abscess and granuloma: Secondary | ICD-10-CM | POA: Diagnosis not present

## 2018-06-27 DIAGNOSIS — R7881 Bacteremia: Secondary | ICD-10-CM | POA: Diagnosis not present

## 2018-06-27 DIAGNOSIS — B9561 Methicillin susceptible Staphylococcus aureus infection as the cause of diseases classified elsewhere: Secondary | ICD-10-CM | POA: Diagnosis not present

## 2018-06-27 DIAGNOSIS — G8220 Paraplegia, unspecified: Secondary | ICD-10-CM | POA: Diagnosis not present

## 2018-06-27 DIAGNOSIS — G9589 Other specified diseases of spinal cord: Secondary | ICD-10-CM | POA: Diagnosis not present

## 2018-06-27 DIAGNOSIS — M4624 Osteomyelitis of vertebra, thoracic region: Secondary | ICD-10-CM | POA: Diagnosis not present

## 2018-06-28 DIAGNOSIS — B9561 Methicillin susceptible Staphylococcus aureus infection as the cause of diseases classified elsewhere: Secondary | ICD-10-CM | POA: Diagnosis not present

## 2018-06-28 DIAGNOSIS — G061 Intraspinal abscess and granuloma: Secondary | ICD-10-CM | POA: Diagnosis not present

## 2018-06-28 DIAGNOSIS — R7881 Bacteremia: Secondary | ICD-10-CM | POA: Diagnosis not present

## 2018-06-28 DIAGNOSIS — G9589 Other specified diseases of spinal cord: Secondary | ICD-10-CM | POA: Diagnosis not present

## 2018-06-28 DIAGNOSIS — G8220 Paraplegia, unspecified: Secondary | ICD-10-CM | POA: Diagnosis not present

## 2018-06-28 DIAGNOSIS — M4624 Osteomyelitis of vertebra, thoracic region: Secondary | ICD-10-CM | POA: Diagnosis not present

## 2018-06-29 ENCOUNTER — Encounter: Payer: Self-pay | Admitting: Cardiology

## 2018-06-29 DIAGNOSIS — G061 Intraspinal abscess and granuloma: Secondary | ICD-10-CM | POA: Diagnosis not present

## 2018-06-29 DIAGNOSIS — B9561 Methicillin susceptible Staphylococcus aureus infection as the cause of diseases classified elsewhere: Secondary | ICD-10-CM | POA: Diagnosis not present

## 2018-06-29 DIAGNOSIS — G9589 Other specified diseases of spinal cord: Secondary | ICD-10-CM | POA: Diagnosis not present

## 2018-06-29 DIAGNOSIS — M4624 Osteomyelitis of vertebra, thoracic region: Secondary | ICD-10-CM | POA: Diagnosis not present

## 2018-06-29 DIAGNOSIS — G8220 Paraplegia, unspecified: Secondary | ICD-10-CM | POA: Diagnosis not present

## 2018-06-29 DIAGNOSIS — R7881 Bacteremia: Secondary | ICD-10-CM | POA: Diagnosis not present

## 2018-06-30 ENCOUNTER — Encounter: Payer: Self-pay | Admitting: Cardiology

## 2018-06-30 DIAGNOSIS — R7881 Bacteremia: Secondary | ICD-10-CM | POA: Diagnosis not present

## 2018-06-30 DIAGNOSIS — B9561 Methicillin susceptible Staphylococcus aureus infection as the cause of diseases classified elsewhere: Secondary | ICD-10-CM | POA: Diagnosis not present

## 2018-06-30 DIAGNOSIS — M4624 Osteomyelitis of vertebra, thoracic region: Secondary | ICD-10-CM | POA: Diagnosis not present

## 2018-06-30 DIAGNOSIS — G061 Intraspinal abscess and granuloma: Secondary | ICD-10-CM | POA: Diagnosis not present

## 2018-06-30 DIAGNOSIS — G9589 Other specified diseases of spinal cord: Secondary | ICD-10-CM | POA: Diagnosis not present

## 2018-06-30 DIAGNOSIS — G8220 Paraplegia, unspecified: Secondary | ICD-10-CM | POA: Diagnosis not present

## 2018-06-30 IMAGING — DX DG HIP (WITH OR WITHOUT PELVIS) 2-3V*R*
4 series · 4 of 4 positions shown · non-contrast
Comparison: None.

CLINICAL DATA: Fall, right hip pain

EXAM:
DG HIP (WITH OR WITHOUT PELVIS) 2-3V RIGHT

[pelvis ap (1 of 2)]
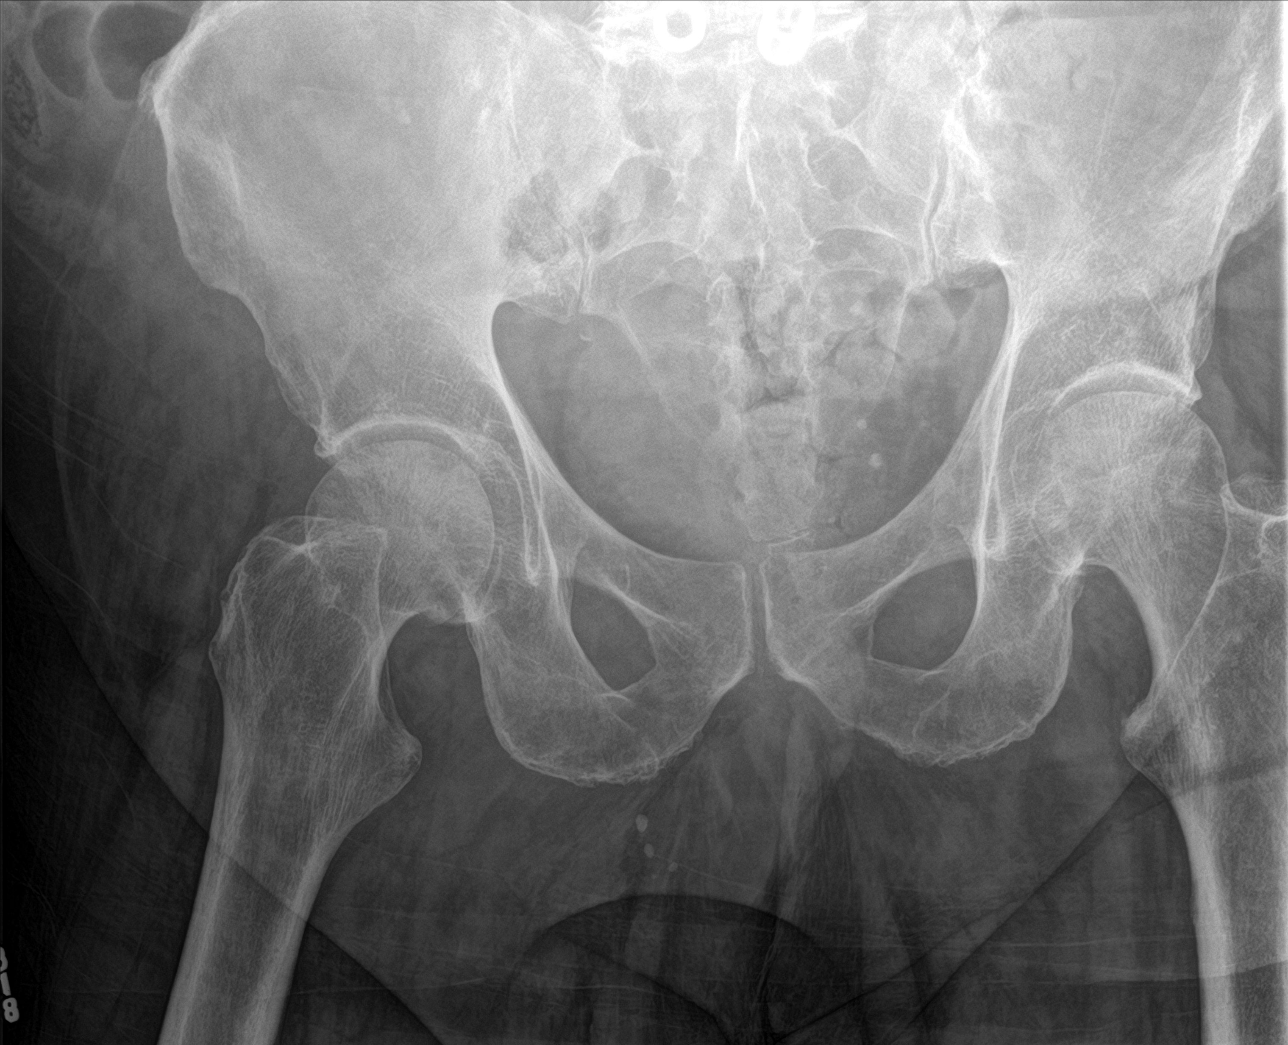

[pelvis ap (2 of 2)]
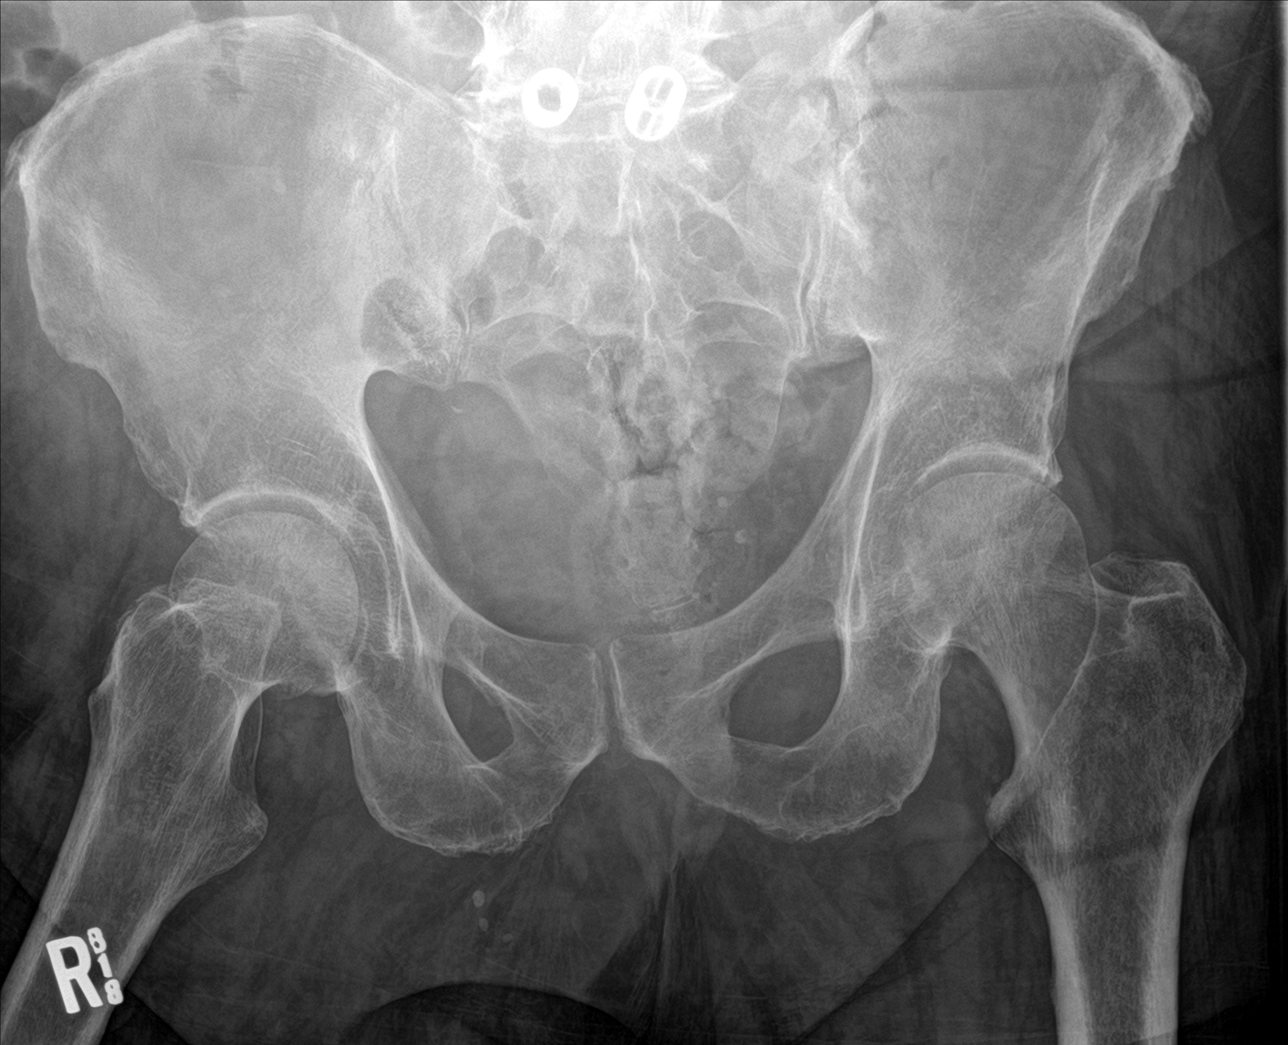

[hip ap]
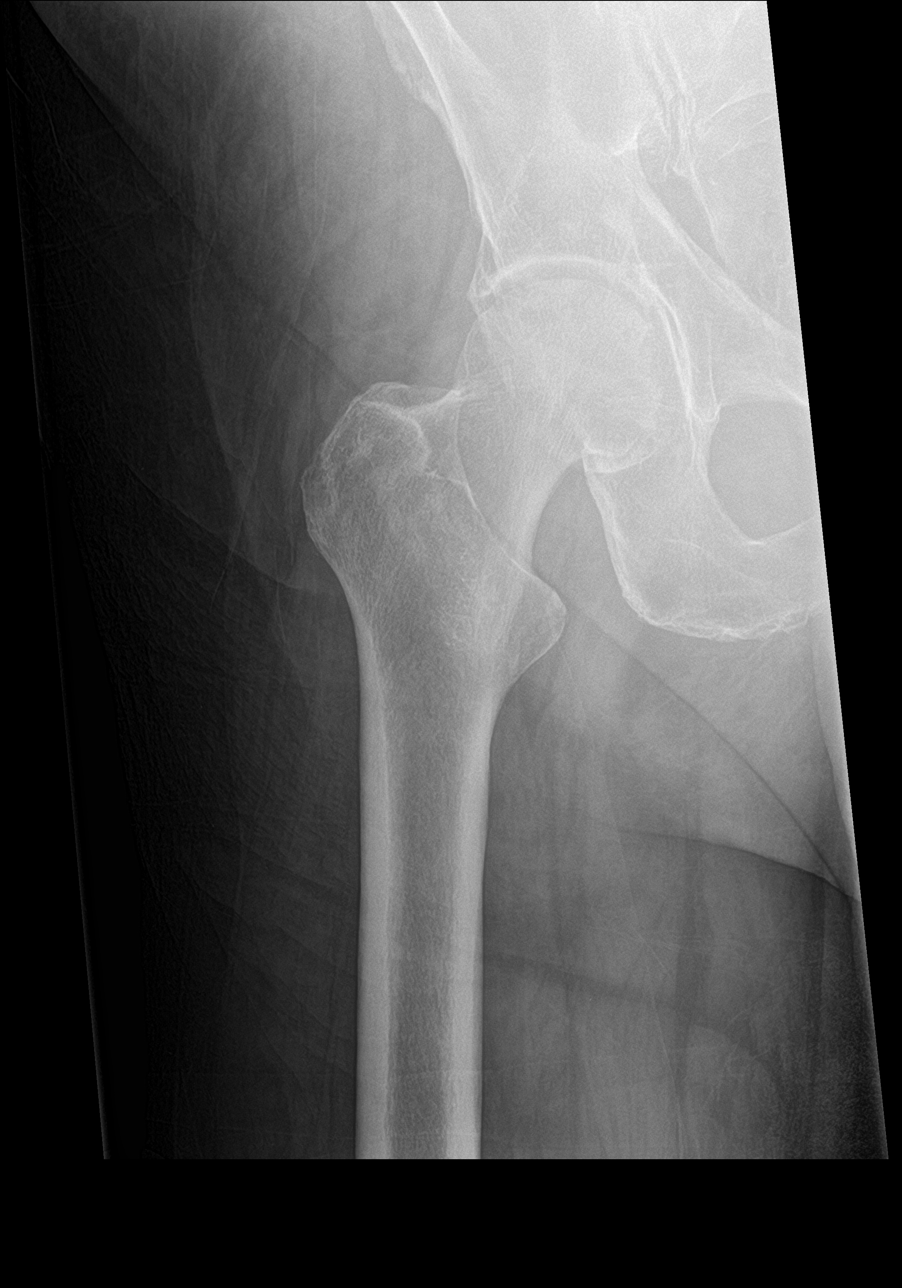

[hip lat]
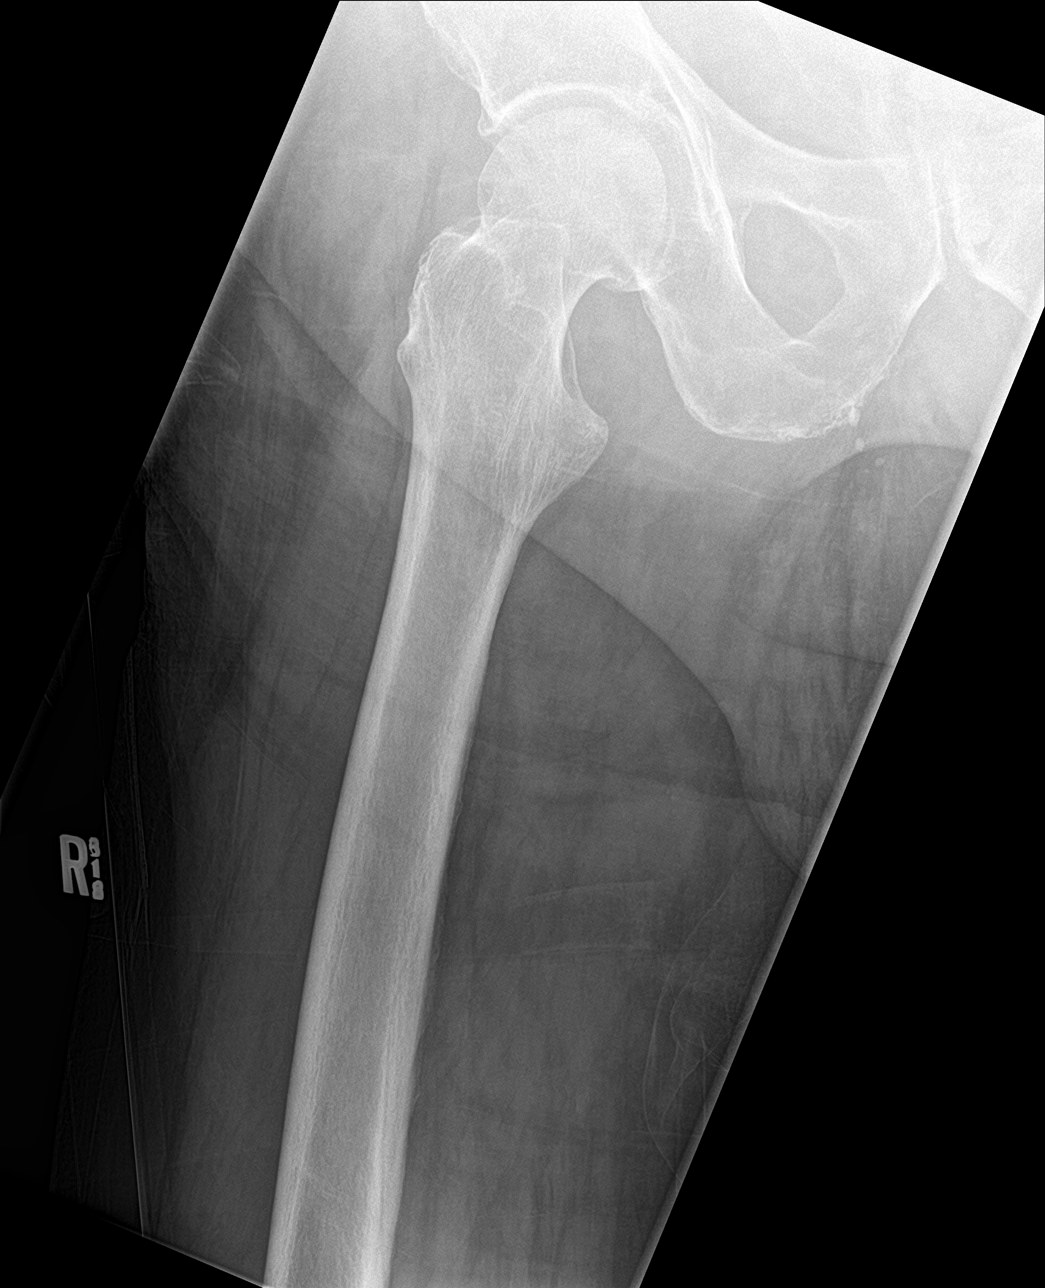

[4 of 4 positions shown; findings below may reference images not displayed]

FINDINGS: No fracture or dislocation is seen.

Bilateral hip joint spaces are preserved.

Visualized bony pelvis appears intact.

Degenerative changes at L5-S1.
IMPRESSION: Negative.

## 2018-06-30 IMAGING — DX DG KNEE 1-2V*R*
2 series · 2 of 2 positions shown · non-contrast
Comparison: MRI of the LEFT knee November 14, 2009

CLINICAL DATA: Fell [REDACTED] landing on bilateral knees.

EXAM:
RIGHT KNEE - 1-2 VIEW; LEFT KNEE - 1-2 VIEW

[knee ap]
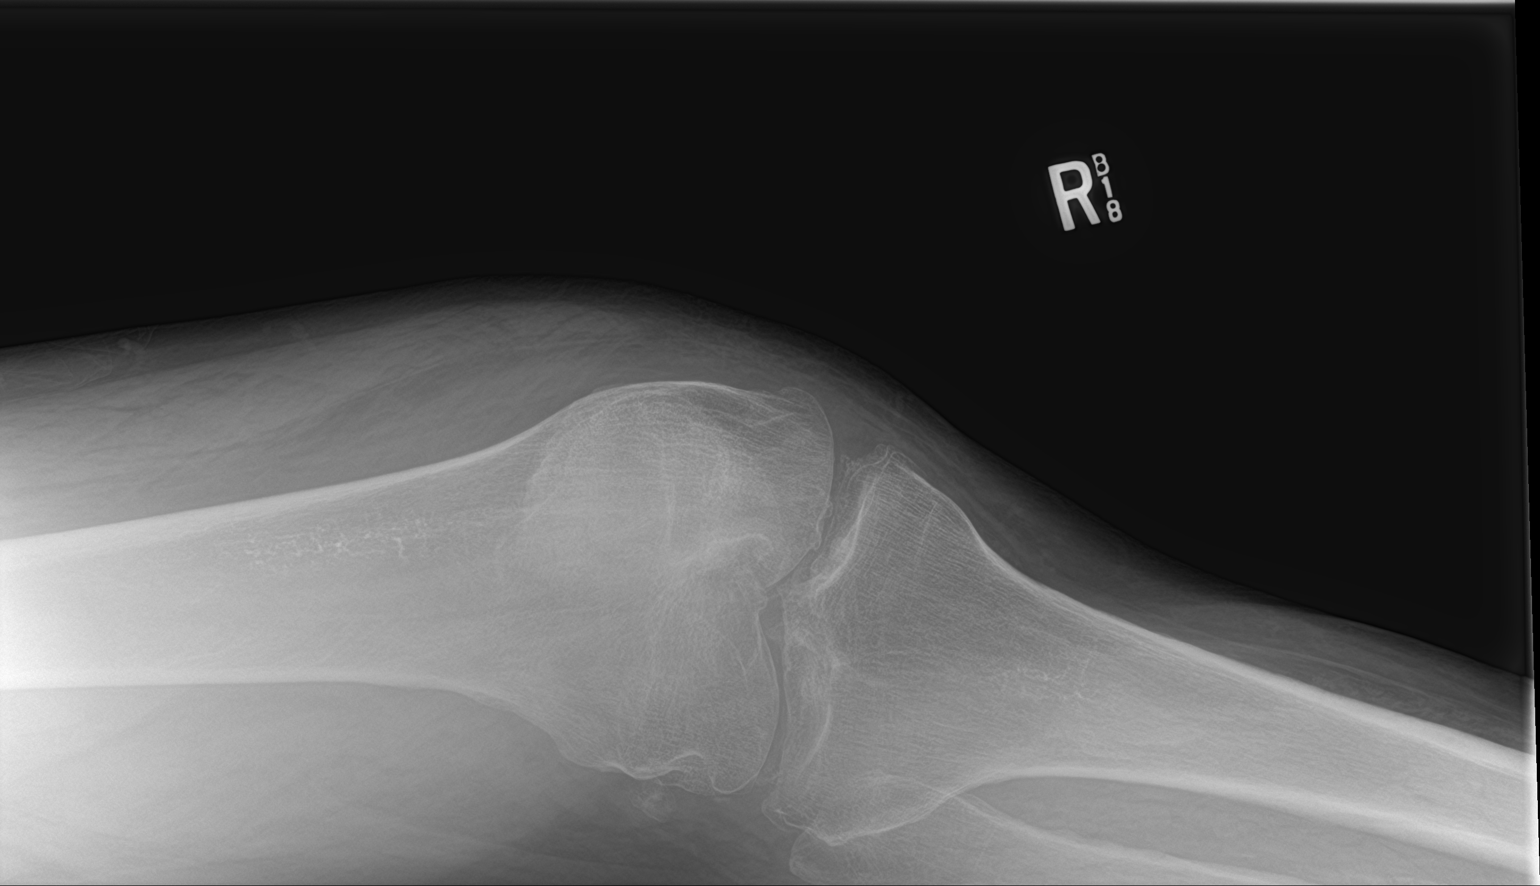

[knee lat]
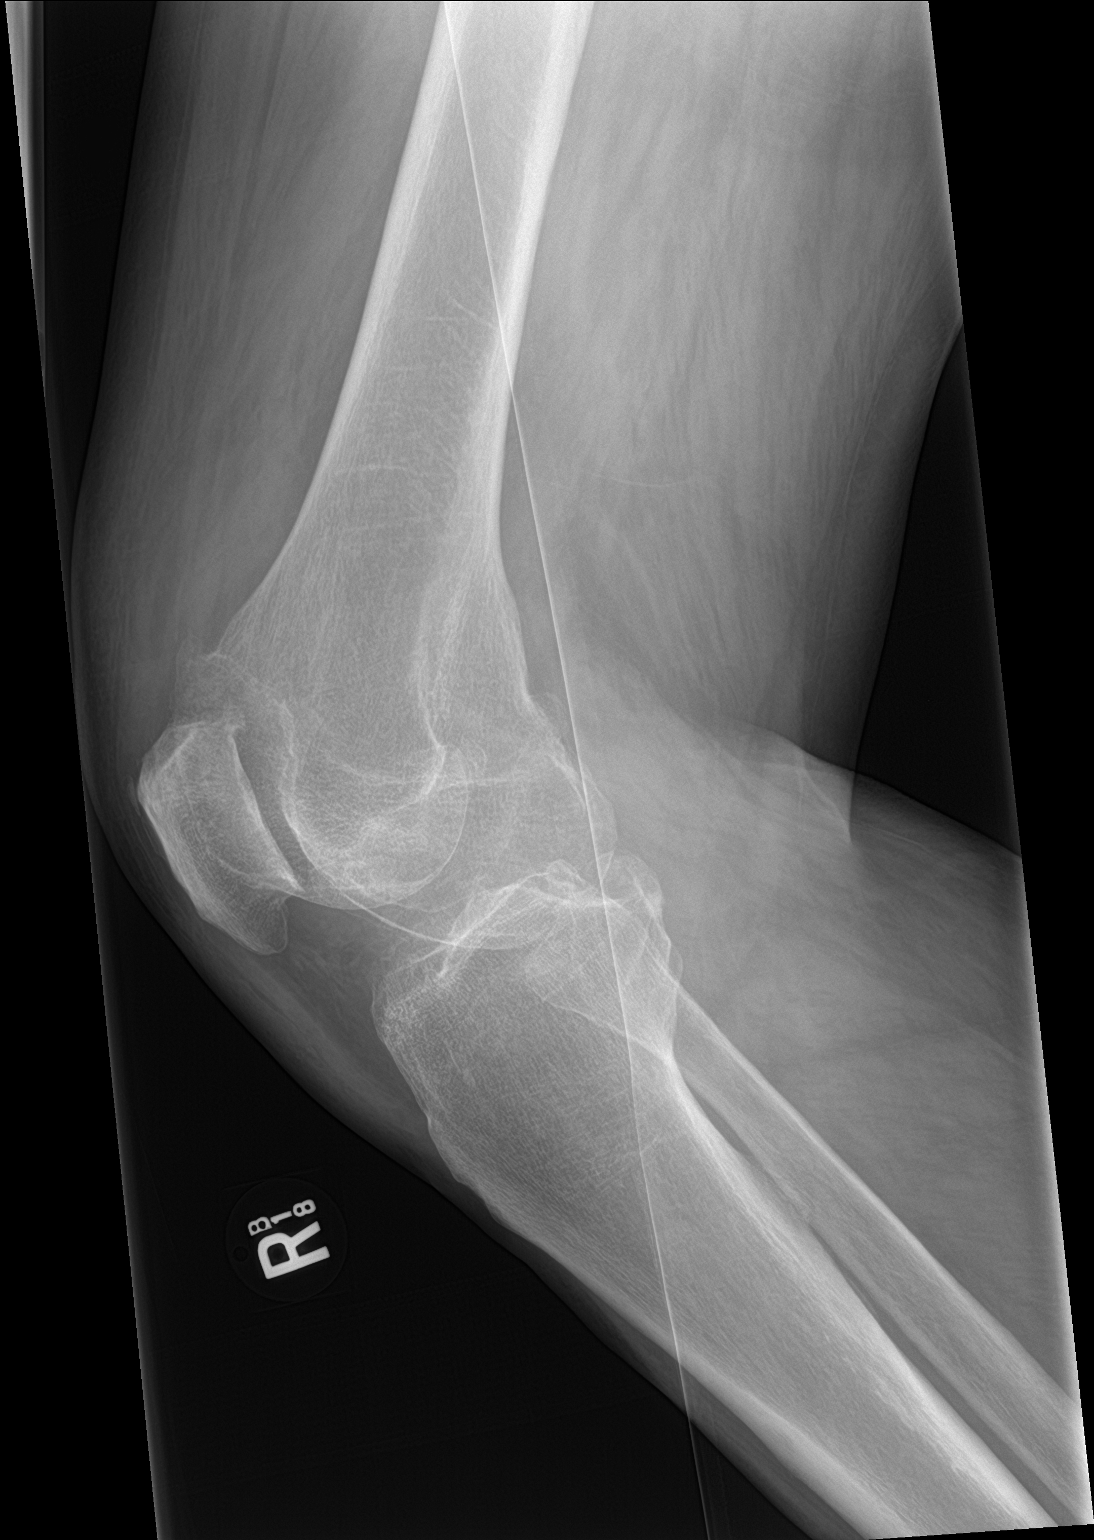

[2 of 2 positions shown; findings below may reference images not displayed]

FINDINGS: LEFT: No acute fracture deformity or dislocation. Moderate to severe
tricompartmental joint space narrowing with periarticular sclerosis
and marginal spurring. Osteopenia without destructive bony lesions.
Small suprapatellar joint effusion. Mild vascular calcifications.

RIGHT: No acute fracture deformity dislocation. Moderate
tricompartmental joint space narrowing with periarticular sclerosis
and marginal spurring. No destructive bony lesions. Osteopenia. Soft
tissue planes are nonsuspicious.
IMPRESSION: 1. No acute fracture deformity dislocation. Small LEFT suprapatellar
joint effusion.
2. Moderate RIGHT and moderate to severe LEFT tricompartmental
osteoarthrosis.

## 2018-06-30 NOTE — Telephone Encounter (Signed)
Good question.  Actually, rifampin can interact with both Coumadin and Eliquis by generally decreasing the efficacy of both drugs due to metabolism interactions.  In the case of rifampin and Eliquis, one would not be able to monitor this decreased efficacy, but with rifampin and Coumadin, at least the INR levels could be followed and adjusted.  Would likely go with the rifampin and Coumadin combination.

## 2018-07-03 DIAGNOSIS — G8220 Paraplegia, unspecified: Secondary | ICD-10-CM | POA: Diagnosis not present

## 2018-07-03 DIAGNOSIS — G061 Intraspinal abscess and granuloma: Secondary | ICD-10-CM | POA: Diagnosis not present

## 2018-07-03 DIAGNOSIS — G9589 Other specified diseases of spinal cord: Secondary | ICD-10-CM | POA: Diagnosis not present

## 2018-07-03 DIAGNOSIS — R7881 Bacteremia: Secondary | ICD-10-CM | POA: Diagnosis not present

## 2018-07-03 DIAGNOSIS — B9561 Methicillin susceptible Staphylococcus aureus infection as the cause of diseases classified elsewhere: Secondary | ICD-10-CM | POA: Diagnosis not present

## 2018-07-03 DIAGNOSIS — M4624 Osteomyelitis of vertebra, thoracic region: Secondary | ICD-10-CM | POA: Diagnosis not present

## 2018-07-04 ENCOUNTER — Telehealth: Payer: Self-pay | Admitting: *Deleted

## 2018-07-04 DIAGNOSIS — R7881 Bacteremia: Secondary | ICD-10-CM | POA: Diagnosis not present

## 2018-07-04 DIAGNOSIS — G9589 Other specified diseases of spinal cord: Secondary | ICD-10-CM | POA: Diagnosis not present

## 2018-07-04 DIAGNOSIS — M4624 Osteomyelitis of vertebra, thoracic region: Secondary | ICD-10-CM | POA: Diagnosis not present

## 2018-07-04 DIAGNOSIS — G061 Intraspinal abscess and granuloma: Secondary | ICD-10-CM | POA: Diagnosis not present

## 2018-07-04 DIAGNOSIS — B9561 Methicillin susceptible Staphylococcus aureus infection as the cause of diseases classified elsewhere: Secondary | ICD-10-CM | POA: Diagnosis not present

## 2018-07-04 DIAGNOSIS — G8220 Paraplegia, unspecified: Secondary | ICD-10-CM | POA: Diagnosis not present

## 2018-07-04 NOTE — Telephone Encounter (Signed)
He has had premature atrial complexes documented previously by ECG, and this may be what is making his heart rate irregular now.  He could always have a follow-up ECG done prior to his pending visit.

## 2018-07-04 NOTE — Telephone Encounter (Signed)
OT is with pt at daughters house today and says he has an irregular HR today - BP was 140s/90s - says feels like its skipping every 4-7 beats - says HR is 84 - denies palpitations or SOB - says he just feels tired and weak. Recently stopped Eliquis and was given Dalteparin 18,000 units daily injection - has appt with Dr Domenic Polite on 8/19 - 1st available with extenders in [place] is 8/3

## 2018-07-04 NOTE — Telephone Encounter (Signed)
Pt aware and says that he doesn't feel bad or having any symptoms other that being tired. Offered him nurse visit appt for EKG just to be sure and pt would like to talk with his daughter 1st and would call back if he wanted to come in for EKG prior to upcoming appt with Dr Domenic Polite.

## 2018-07-05 DIAGNOSIS — B9561 Methicillin susceptible Staphylococcus aureus infection as the cause of diseases classified elsewhere: Secondary | ICD-10-CM | POA: Diagnosis not present

## 2018-07-05 DIAGNOSIS — G8220 Paraplegia, unspecified: Secondary | ICD-10-CM | POA: Diagnosis not present

## 2018-07-05 DIAGNOSIS — M4624 Osteomyelitis of vertebra, thoracic region: Secondary | ICD-10-CM | POA: Diagnosis not present

## 2018-07-05 DIAGNOSIS — G061 Intraspinal abscess and granuloma: Secondary | ICD-10-CM | POA: Diagnosis not present

## 2018-07-05 DIAGNOSIS — R7881 Bacteremia: Secondary | ICD-10-CM | POA: Diagnosis not present

## 2018-07-05 DIAGNOSIS — G9589 Other specified diseases of spinal cord: Secondary | ICD-10-CM | POA: Diagnosis not present

## 2018-07-06 DIAGNOSIS — G061 Intraspinal abscess and granuloma: Secondary | ICD-10-CM | POA: Diagnosis not present

## 2018-07-06 DIAGNOSIS — G8220 Paraplegia, unspecified: Secondary | ICD-10-CM | POA: Diagnosis not present

## 2018-07-06 DIAGNOSIS — K219 Gastro-esophageal reflux disease without esophagitis: Secondary | ICD-10-CM | POA: Diagnosis not present

## 2018-07-06 DIAGNOSIS — I10 Essential (primary) hypertension: Secondary | ICD-10-CM | POA: Diagnosis not present

## 2018-07-06 DIAGNOSIS — M1990 Unspecified osteoarthritis, unspecified site: Secondary | ICD-10-CM | POA: Diagnosis not present

## 2018-07-06 DIAGNOSIS — G4733 Obstructive sleep apnea (adult) (pediatric): Secondary | ICD-10-CM | POA: Diagnosis not present

## 2018-07-06 DIAGNOSIS — I2510 Atherosclerotic heart disease of native coronary artery without angina pectoris: Secondary | ICD-10-CM | POA: Diagnosis not present

## 2018-07-06 DIAGNOSIS — F4310 Post-traumatic stress disorder, unspecified: Secondary | ICD-10-CM | POA: Diagnosis not present

## 2018-07-06 DIAGNOSIS — R413 Other amnesia: Secondary | ICD-10-CM | POA: Diagnosis not present

## 2018-07-06 DIAGNOSIS — G894 Chronic pain syndrome: Secondary | ICD-10-CM | POA: Diagnosis not present

## 2018-07-06 DIAGNOSIS — F329 Major depressive disorder, single episode, unspecified: Secondary | ICD-10-CM | POA: Diagnosis not present

## 2018-07-06 DIAGNOSIS — M170 Bilateral primary osteoarthritis of knee: Secondary | ICD-10-CM | POA: Diagnosis not present

## 2018-07-06 DIAGNOSIS — Z9181 History of falling: Secondary | ICD-10-CM | POA: Diagnosis not present

## 2018-07-06 DIAGNOSIS — D649 Anemia, unspecified: Secondary | ICD-10-CM | POA: Diagnosis not present

## 2018-07-06 DIAGNOSIS — M4624 Osteomyelitis of vertebra, thoracic region: Secondary | ICD-10-CM | POA: Diagnosis not present

## 2018-07-06 DIAGNOSIS — Z792 Long term (current) use of antibiotics: Secondary | ICD-10-CM | POA: Diagnosis not present

## 2018-07-06 DIAGNOSIS — Z87891 Personal history of nicotine dependence: Secondary | ICD-10-CM | POA: Diagnosis not present

## 2018-07-06 DIAGNOSIS — R339 Retention of urine, unspecified: Secondary | ICD-10-CM | POA: Diagnosis not present

## 2018-07-06 DIAGNOSIS — Z7901 Long term (current) use of anticoagulants: Secondary | ICD-10-CM | POA: Diagnosis not present

## 2018-07-06 DIAGNOSIS — Z8546 Personal history of malignant neoplasm of prostate: Secondary | ICD-10-CM | POA: Diagnosis not present

## 2018-07-06 DIAGNOSIS — Z5181 Encounter for therapeutic drug level monitoring: Secondary | ICD-10-CM | POA: Diagnosis not present

## 2018-07-06 DIAGNOSIS — Z6833 Body mass index (BMI) 33.0-33.9, adult: Secondary | ICD-10-CM | POA: Diagnosis not present

## 2018-07-06 DIAGNOSIS — G9589 Other specified diseases of spinal cord: Secondary | ICD-10-CM | POA: Diagnosis not present

## 2018-07-06 DIAGNOSIS — Z8781 Personal history of (healed) traumatic fracture: Secondary | ICD-10-CM | POA: Diagnosis not present

## 2018-07-10 DIAGNOSIS — G061 Intraspinal abscess and granuloma: Secondary | ICD-10-CM | POA: Diagnosis not present

## 2018-07-10 DIAGNOSIS — G9589 Other specified diseases of spinal cord: Secondary | ICD-10-CM | POA: Diagnosis not present

## 2018-07-10 DIAGNOSIS — M170 Bilateral primary osteoarthritis of knee: Secondary | ICD-10-CM | POA: Diagnosis not present

## 2018-07-10 DIAGNOSIS — G894 Chronic pain syndrome: Secondary | ICD-10-CM | POA: Diagnosis not present

## 2018-07-10 DIAGNOSIS — M4624 Osteomyelitis of vertebra, thoracic region: Secondary | ICD-10-CM | POA: Diagnosis not present

## 2018-07-10 DIAGNOSIS — G8220 Paraplegia, unspecified: Secondary | ICD-10-CM | POA: Diagnosis not present

## 2018-07-12 DIAGNOSIS — G894 Chronic pain syndrome: Secondary | ICD-10-CM | POA: Diagnosis not present

## 2018-07-12 DIAGNOSIS — G061 Intraspinal abscess and granuloma: Secondary | ICD-10-CM | POA: Diagnosis not present

## 2018-07-12 DIAGNOSIS — M4624 Osteomyelitis of vertebra, thoracic region: Secondary | ICD-10-CM | POA: Diagnosis not present

## 2018-07-12 DIAGNOSIS — G8220 Paraplegia, unspecified: Secondary | ICD-10-CM | POA: Diagnosis not present

## 2018-07-12 DIAGNOSIS — G9589 Other specified diseases of spinal cord: Secondary | ICD-10-CM | POA: Diagnosis not present

## 2018-07-12 DIAGNOSIS — M170 Bilateral primary osteoarthritis of knee: Secondary | ICD-10-CM | POA: Diagnosis not present

## 2018-07-14 DIAGNOSIS — M170 Bilateral primary osteoarthritis of knee: Secondary | ICD-10-CM | POA: Diagnosis not present

## 2018-07-14 DIAGNOSIS — G8220 Paraplegia, unspecified: Secondary | ICD-10-CM | POA: Diagnosis not present

## 2018-07-14 DIAGNOSIS — G061 Intraspinal abscess and granuloma: Secondary | ICD-10-CM | POA: Diagnosis not present

## 2018-07-14 DIAGNOSIS — G9589 Other specified diseases of spinal cord: Secondary | ICD-10-CM | POA: Diagnosis not present

## 2018-07-14 DIAGNOSIS — M4624 Osteomyelitis of vertebra, thoracic region: Secondary | ICD-10-CM | POA: Diagnosis not present

## 2018-07-14 DIAGNOSIS — G894 Chronic pain syndrome: Secondary | ICD-10-CM | POA: Diagnosis not present

## 2018-07-17 DIAGNOSIS — G894 Chronic pain syndrome: Secondary | ICD-10-CM | POA: Diagnosis not present

## 2018-07-17 DIAGNOSIS — G8220 Paraplegia, unspecified: Secondary | ICD-10-CM | POA: Diagnosis not present

## 2018-07-17 DIAGNOSIS — M4624 Osteomyelitis of vertebra, thoracic region: Secondary | ICD-10-CM | POA: Diagnosis not present

## 2018-07-17 DIAGNOSIS — M170 Bilateral primary osteoarthritis of knee: Secondary | ICD-10-CM | POA: Diagnosis not present

## 2018-07-17 DIAGNOSIS — G061 Intraspinal abscess and granuloma: Secondary | ICD-10-CM | POA: Diagnosis not present

## 2018-07-17 DIAGNOSIS — G9589 Other specified diseases of spinal cord: Secondary | ICD-10-CM | POA: Diagnosis not present

## 2018-07-18 ENCOUNTER — Encounter: Payer: Self-pay | Admitting: Internal Medicine

## 2018-07-18 ENCOUNTER — Ambulatory Visit (INDEPENDENT_AMBULATORY_CARE_PROVIDER_SITE_OTHER): Payer: Medicare Other | Admitting: Internal Medicine

## 2018-07-18 DIAGNOSIS — M4644 Discitis, unspecified, thoracic region: Secondary | ICD-10-CM | POA: Diagnosis not present

## 2018-07-18 NOTE — Assessment & Plan Note (Signed)
He is having slow clinical improvement.  His inflammatory markers were still elevated at the time of his last visit.  He tells me that x-rays taken at the time of his last visit with Dr. Ellene Route showed progressive fusion at the site of his thoracic spine surgery.  He is now had 4 months of total antibiotic therapy.  I talked to him about management options including stopping all antibiotics now, continuing cephalexin alone or continuing both cephalexin and rifampin.  He elects to stay on his current 2 drug regimen.  He will get repeat lab work today and follow-up in 6 weeks.

## 2018-07-18 NOTE — Progress Notes (Signed)
[facility] for Infectious Disease  Patient Active Problem List   Diagnosis Date Noted  . Pseudogout of knee     Priority: High  . Discitis of thoracic region     Priority: High  . Bacteremia due to methicillin susceptible Staphylococcus aureus (MSSA) 03/18/2018    Priority: High  . Pyogenic arthritis of right knee joint ([place])   . Falls frequently 05/08/2018  . Fall   . Sleep disturbance   . Post-op pain   . Hypokalemia   . Morbid obesity ([place])   . Postoperative pain   . DVT, lower extremity, distal, acute, bilateral ([place])   . Acute blood loss anemia   . Anemia of chronic disease   . Essential hypertension   . Bacteremia   . Myelopathy ([place]) 03/30/2018  . Paraplegia ([place])   . Postlaminectomy syndrome, lumbar region   . Epidural abscess   . Abdominal distension   . Encephalopathy   . Weakness of both lower extremities   . Spinal cord stimulator status   . Staphylococcus aureus sepsis ([place]) 03/20/2018  . Obesity 03/18/2018  . Nephrolithiasis 03/16/2018  . AKI (acute kidney injury) ([place]) 03/16/2018  . Cognitive impairment 03/16/2018  . Chronic pain syndrome 03/16/2018  . B12 deficiency 01/28/2016  . Memory difficulty 07/23/2015  . History of cerebrovascular disease 07/23/2015  . FH: colon cancer 08/13/2014  . Hx of adenomatous colonic polyps 08/13/2014  . Chronic back pain 05/30/2012  . CAD, NATIVE VESSEL 07/17/2010  . HYPERCHOLESTEROLEMIA 10/31/2009  . SMOKELESS TOBACCO ABUSE 10/31/2009  . Obstructive sleep apnea 10/31/2009  . Essential hypertension, benign 10/31/2009  . GERD 10/31/2009    Patient's Medications  New Prescriptions   No medications on file  Previous Medications   AMLODIPINE (NORVASC) 10 MG TABLET    Take 1 tablet (10 mg total) by mouth daily.   ASPIRIN 81 MG TABLET    Take 1 tablet (81 mg total) by mouth daily.   BACLOFEN (LIORESAL) 10 MG TABLET    TAKE ONE TABLET IN THE MORNING AND MIDDAY, THEN TWO TABLETS AT NIGHT   BACLOFEN 5  MG TABS    Take 5 mg by mouth 3 (three) times daily.   CEPHALEXIN (KEFLEX) 500 MG CAPSULE    Take 1 capsule (500 mg total) by mouth 3 (three) times daily.   COLCHICINE 0.6 MG TABLET    Take 1 tablet (0.6 mg total) by mouth daily.   CYCLOSPORINE (RESTASIS) 0.05 % OPHTHALMIC EMULSION    1 DROP INTO BOTH EYES TWICE A DAY FOR DRY EYES   DALTEPARIN SODIUM (FRAGMIN) 32951 UNT/0.72ML SOLN    Inject 0.72 mLs into the skin.   DIAZEPAM (VALIUM) 5 MG TABLET    Take 1 tablet (5 mg total) by mouth every 6 (six) hours as needed for muscle spasms.   DOCUSATE SODIUM (COLACE) 100 MG CAPSULE    Take 2 capsules (200 mg total) by mouth 2 (two) times daily.   DONEPEZIL (ARICEPT) 10 MG TABLET    Take 1 tablet (10 mg total) by mouth at bedtime.   ENOXAPARIN (LOVENOX) 150 MG/ML INJECTION    Inject 0.87 mLs (130 mg total) into the skin every 12 (twelve) hours.   FINASTERIDE (PROSCAR) 5 MG TABLET    Take 1 tablet (5 mg total) by mouth daily. Reported on 05/04/2016   FLUTICASONE (FLONASE) 50 MCG/ACT NASAL SPRAY    Place 1 spray into both nostrils daily as needed for allergies.  GABAPENTIN (NEURONTIN) 400 MG CAPSULE    Take 1 capsule (400 mg total) by mouth 3 (three) times daily.   LATANOPROST (XALATAN) 0.005 % OPHTHALMIC SOLUTION    Place 1 drop into both eyes at bedtime.   LINACLOTIDE (LINZESS) 290 MCG CAPS CAPSULE    Take 1 capsule (290 mcg total) by mouth daily before breakfast.   LISINOPRIL (PRINIVIL,ZESTRIL) 5 MG TABLET    Take 1 tablet (5 mg total) by mouth daily.   MELATONIN 3 MG TABS    Take 0.5 tablets (1.5 mg total) by mouth at bedtime.   MEMANTINE (NAMENDA) 10 MG TABLET    Take 1 tablet (10 mg total) by mouth 2 (two) times daily.   OMEPRAZOLE (PRILOSEC) 20 MG CAPSULE    Take 2 capsules (40 mg total) by mouth daily.   OXYCODONE HCL 10 MG TABS    Take 1 tablet (10 mg total) by mouth every 4 (four) hours as needed (moderate to severe pain).   POLYETHYLENE GLYCOL (MIRALAX / GLYCOLAX) PACKET    Take 17 g by mouth  daily.   POTASSIUM CHLORIDE SA (K-DUR,KLOR-CON) 20 MEQ TABLET    Take 1 tablet (20 mEq total) by mouth daily.   PRAVASTATIN (PRAVACHOL) 10 MG TABLET    Take 1 tablet (10 mg total) by mouth every evening.   PREDNISONE (DELTASONE) 20 MG TABLET    Take 1 tablet (20 mg total) by mouth daily with breakfast. For 3 more days and then stop.   RIFAMPIN (RIFADIN) 300 MG CAPSULE    Take 1 capsule (300 mg total) by mouth every 12 (twelve) hours. Continue through 06/18/2018 and then to be determined by ID clinic   TRAZODONE (DESYREL) 100 MG TABLET    Take 1 tablet (100 mg total) by mouth at bedtime.   VENLAFAXINE XR (EFFEXOR-XR) 150 MG 24 HR CAPSULE    Take 1 capsule (150 mg total) by mouth 2 (two) times daily.   VITAMIN B-12 (CYANOCOBALAMIN) 1000 MCG TABLET    Take 1 tablet (1,000 mcg total) by mouth daily.   WARFARIN (COUMADIN) 5 MG TABLET    15mg  x 1 on 05/22/18 and then PT/INR on 05/23/18 and then to determine further dose.  Modified Medications   No medications on file  Discontinued Medications   No medications on file    Subjective: Cody Velez is in for his routine hospital follow-up.  He is accompanied by his daughter.  He was admitted to [facility] in early April with him MSSA bacteremia.  This was complicated by thoracic discitis at the T10-11 level.  He was transferred to [facility] and underwent incision and drainage with hardware fusion on 03/28/2018.  The initial plan was 8 weeks of IV cefazolin and oral rifampin but he was readmitted to the hospital on 04/21/2018 with bilateral knee pain.  He was found to have pseudogout but there was concern for possibly having septic arthritis although Gram stain and cultures were negative.  My partner, Dr. Karolee Ohs recommended extending IV-based therapy until 06/18/2018.  At the time of his last clinic visit on 06/05/2018 I agreed to change him to oral regimen consisting of cephalexin and rifampin.  He has had no problems tolerating his antibiotics.  1 of  his doctors at [place] put him on Eliquis.  Fortunately a pharmacist at the [facility] clinic recognize this very quickly and changed Eliquis to Lovenox because of the drug drug interaction with rifampin.  He continues to receive outpatient physical therapy.  He is making slow progress.  He has a lot of numbness from the waist down and some generalized weakness.  He is now able to walk about 90 feet at one time with the aid of a walker.  He only occasionally require some acetaminophen for mild pain.  Review of Systems: Review of Systems  Constitutional: Negative for chills, diaphoresis and fever.  Gastrointestinal: Positive for constipation. Negative for abdominal pain, diarrhea, nausea and vomiting.  Musculoskeletal: Positive for back pain and joint pain.  Neurological: Positive for sensory change.    Past Medical History:  Diagnosis Date  . Allergic rhinitis   . Arthritis   . Asthma    as a child  . B12 deficiency   . Cancer [facility])    prostate  . Cervicogenic headache 01/28/2016  . Chronic back pain   . Coronary atherosclerosis of native coronary artery    Mild nonobstructive CAD at catheterization January 2015  . Depression   . Essential hypertension, benign   . Falls   . GERD (gastroesophageal reflux disease)   . History of cerebrovascular disease 07/23/2015  . History of pneumonia 02/2011  . Hyperlipidemia   . Kidney stone   . Memory difficulty 07/23/2015  . OSA (obstructive sleep apnea)    CPAP - Dr. [name]  . Prostate cancer ([place])   . PTSD (post-traumatic stress disorder)    [place]  . Rectal bleeding     Social History   Tobacco Use  . Smoking status: Former Smoker    Types: Cigarettes    Last attempt to quit: 12/20/1958    Years since quitting: 59.6  . Smokeless tobacco: Former Systems developer    Types: Chew  . Tobacco comment: Quit chewing 04/2015  Substance Use Topics  . Alcohol use: Yes    Alcohol/week: 0.0 oz    Comment: couple bottles of wine per month  . Drug use: No     Types: Marijuana    Comment: "last used marijuana ~ 1969"    Family History  Problem Relation Age of Onset  . Emphysema Father   . Heart failure Father   . Lung cancer Father   . CAD Father   . Colon cancer Mother   . Stroke Mother   . Breast cancer Mother   . Stroke Sister   . Heart attack Brother   . Dementia Paternal Uncle   . Emphysema Maternal Grandmother   . Stroke Maternal Grandmother   . Asthma Other        grandson  . Heart disease Paternal Grandfather   . Anesthesia problems Neg Hx   . Hypotension Neg Hx   . Malignant hyperthermia Neg Hx   . Pseudochol deficiency Neg Hx     No Known Allergies  Objective: Vitals:   07/18/18 1445  BP: 111/74  Pulse: 80  Temp: 98.6 F (37 C)  TempSrc: Oral  Weight: 277 lb (125.6 kg)   Body mass index is 34.62 kg/m.  Physical Exam  Constitutional: He is oriented to person, place, and time.  He is in good spirits.  He is seated in his wheelchair.  Cardiovascular: Normal rate, regular rhythm and normal heart sounds.  No murmur heard. Pulmonary/Chest: Effort normal and breath sounds normal.  Musculoskeletal:  He has extensive, well-healing incisions over his spine and left flank spinal cord stimulator.  No unusual inflammation is noted.  He only has minor diffuse swelling of his right knee with no excessive warmth.  He has no pain with range  of motion.  Neurological: He is alert and oriented to person, place, and time.  Skin: No rash noted.  Psychiatric: He has a normal mood and affect.    Lab Results Creatinine 06/05/2018: 0.76 mg per DL Sed Rate  Date Value  06/06/2018 72 mm/h (H)  05/11/2018 125 mm/hr (H)  03/21/2018 94 mm/hr (H)   CRP  Date Value  06/06/2018 19.5 mg/L (H)  05/11/2018 22.0 mg/dL (H)  03/21/2018 17.4 mg/dL (H)     Problem List Items Addressed This Visit      High   Discitis of thoracic region    He is having slow clinical improvement.  His inflammatory markers were still elevated at the  time of his last visit.  He tells me that x-rays taken at the time of his last visit with Dr. Ellene Route showed progressive fusion at the site of his thoracic spine surgery.  He is now had 4 months of total antibiotic therapy.  I talked to him about management options including stopping all antibiotics now, continuing cephalexin alone or continuing both cephalexin and rifampin.  He elects to stay on his current 2 drug regimen.  He will get repeat lab work today and follow-up in 6 weeks.      Relevant Orders   C-reactive protein   Sedimentation rate       Michel Bickers, MD [facility] for Infectious [place] [phone] pager   [phone] cell 07/18/2018, 3:11 PM

## 2018-07-19 DIAGNOSIS — M4624 Osteomyelitis of vertebra, thoracic region: Secondary | ICD-10-CM | POA: Diagnosis not present

## 2018-07-19 DIAGNOSIS — G8220 Paraplegia, unspecified: Secondary | ICD-10-CM | POA: Diagnosis not present

## 2018-07-19 DIAGNOSIS — G9589 Other specified diseases of spinal cord: Secondary | ICD-10-CM | POA: Diagnosis not present

## 2018-07-19 DIAGNOSIS — M170 Bilateral primary osteoarthritis of knee: Secondary | ICD-10-CM | POA: Diagnosis not present

## 2018-07-19 DIAGNOSIS — G061 Intraspinal abscess and granuloma: Secondary | ICD-10-CM | POA: Diagnosis not present

## 2018-07-19 DIAGNOSIS — G894 Chronic pain syndrome: Secondary | ICD-10-CM | POA: Diagnosis not present

## 2018-07-19 LAB — C-REACTIVE PROTEIN: CRP: 7 mg/L (ref <8.0)

## 2018-07-19 LAB — SEDIMENTATION RATE: Sed Rate: 33 mm/h — ABNORMAL HIGH (ref 0–20)

## 2018-07-21 DIAGNOSIS — G8220 Paraplegia, unspecified: Secondary | ICD-10-CM | POA: Diagnosis not present

## 2018-07-21 DIAGNOSIS — G061 Intraspinal abscess and granuloma: Secondary | ICD-10-CM | POA: Diagnosis not present

## 2018-07-21 DIAGNOSIS — M4624 Osteomyelitis of vertebra, thoracic region: Secondary | ICD-10-CM | POA: Diagnosis not present

## 2018-07-21 DIAGNOSIS — M170 Bilateral primary osteoarthritis of knee: Secondary | ICD-10-CM | POA: Diagnosis not present

## 2018-07-21 DIAGNOSIS — G894 Chronic pain syndrome: Secondary | ICD-10-CM | POA: Diagnosis not present

## 2018-07-21 DIAGNOSIS — G9589 Other specified diseases of spinal cord: Secondary | ICD-10-CM | POA: Diagnosis not present

## 2018-07-26 DIAGNOSIS — G8220 Paraplegia, unspecified: Secondary | ICD-10-CM | POA: Diagnosis not present

## 2018-07-26 DIAGNOSIS — M170 Bilateral primary osteoarthritis of knee: Secondary | ICD-10-CM | POA: Diagnosis not present

## 2018-07-26 DIAGNOSIS — G894 Chronic pain syndrome: Secondary | ICD-10-CM | POA: Diagnosis not present

## 2018-07-26 DIAGNOSIS — G9589 Other specified diseases of spinal cord: Secondary | ICD-10-CM | POA: Diagnosis not present

## 2018-07-26 DIAGNOSIS — M4624 Osteomyelitis of vertebra, thoracic region: Secondary | ICD-10-CM | POA: Diagnosis not present

## 2018-07-26 DIAGNOSIS — G061 Intraspinal abscess and granuloma: Secondary | ICD-10-CM | POA: Diagnosis not present

## 2018-07-28 DIAGNOSIS — G8220 Paraplegia, unspecified: Secondary | ICD-10-CM | POA: Diagnosis not present

## 2018-07-28 DIAGNOSIS — G9589 Other specified diseases of spinal cord: Secondary | ICD-10-CM | POA: Diagnosis not present

## 2018-07-28 DIAGNOSIS — G894 Chronic pain syndrome: Secondary | ICD-10-CM | POA: Diagnosis not present

## 2018-07-28 DIAGNOSIS — M4624 Osteomyelitis of vertebra, thoracic region: Secondary | ICD-10-CM | POA: Diagnosis not present

## 2018-07-28 DIAGNOSIS — G061 Intraspinal abscess and granuloma: Secondary | ICD-10-CM | POA: Diagnosis not present

## 2018-07-28 DIAGNOSIS — M170 Bilateral primary osteoarthritis of knee: Secondary | ICD-10-CM | POA: Diagnosis not present

## 2018-07-31 DIAGNOSIS — I10 Essential (primary) hypertension: Secondary | ICD-10-CM | POA: Diagnosis not present

## 2018-07-31 DIAGNOSIS — G061 Intraspinal abscess and granuloma: Secondary | ICD-10-CM | POA: Diagnosis not present

## 2018-08-02 DIAGNOSIS — M4624 Osteomyelitis of vertebra, thoracic region: Secondary | ICD-10-CM | POA: Diagnosis not present

## 2018-08-02 DIAGNOSIS — G9589 Other specified diseases of spinal cord: Secondary | ICD-10-CM | POA: Diagnosis not present

## 2018-08-02 DIAGNOSIS — M170 Bilateral primary osteoarthritis of knee: Secondary | ICD-10-CM | POA: Diagnosis not present

## 2018-08-02 DIAGNOSIS — G8220 Paraplegia, unspecified: Secondary | ICD-10-CM | POA: Diagnosis not present

## 2018-08-02 DIAGNOSIS — G061 Intraspinal abscess and granuloma: Secondary | ICD-10-CM | POA: Diagnosis not present

## 2018-08-02 DIAGNOSIS — G894 Chronic pain syndrome: Secondary | ICD-10-CM | POA: Diagnosis not present

## 2018-08-04 DIAGNOSIS — G8220 Paraplegia, unspecified: Secondary | ICD-10-CM | POA: Diagnosis not present

## 2018-08-04 DIAGNOSIS — G061 Intraspinal abscess and granuloma: Secondary | ICD-10-CM | POA: Diagnosis not present

## 2018-08-04 DIAGNOSIS — M4624 Osteomyelitis of vertebra, thoracic region: Secondary | ICD-10-CM | POA: Diagnosis not present

## 2018-08-04 DIAGNOSIS — G894 Chronic pain syndrome: Secondary | ICD-10-CM | POA: Diagnosis not present

## 2018-08-04 DIAGNOSIS — G9589 Other specified diseases of spinal cord: Secondary | ICD-10-CM | POA: Diagnosis not present

## 2018-08-04 DIAGNOSIS — M170 Bilateral primary osteoarthritis of knee: Secondary | ICD-10-CM | POA: Diagnosis not present

## 2018-08-07 ENCOUNTER — Ambulatory Visit (INDEPENDENT_AMBULATORY_CARE_PROVIDER_SITE_OTHER): Payer: Medicare Other | Admitting: Cardiology

## 2018-08-07 ENCOUNTER — Encounter: Payer: Self-pay | Admitting: Cardiology

## 2018-08-07 VITALS — BP 100/68 | HR 90 | Ht 75.0 in | Wt 285.0 lb

## 2018-08-07 DIAGNOSIS — I10 Essential (primary) hypertension: Secondary | ICD-10-CM | POA: Diagnosis not present

## 2018-08-07 DIAGNOSIS — E782 Mixed hyperlipidemia: Secondary | ICD-10-CM | POA: Diagnosis not present

## 2018-08-07 DIAGNOSIS — I2510 Atherosclerotic heart disease of native coronary artery without angina pectoris: Secondary | ICD-10-CM | POA: Diagnosis not present

## 2018-08-07 NOTE — Patient Instructions (Signed)
Medication Instructions:  Your physician recommends that you continue on your current medications as directed. Please refer to the Current Medication list given to you today.  Labwork: NONE  Testing/Procedures: NONE  Follow-Up: Your physician wants you to follow-up in: 6 MONTHS WITH DR. MCDOWELL. You will receive a reminder letter in the mail two months in advance. If you don't receive a letter, please call our office to schedule the follow-up appointment.  Any Other Special Instructions Will Be Listed Below (If Applicable).  If you need a refill on your cardiac medications before your next appointment, please call your pharmacy. 

## 2018-08-07 NOTE — Progress Notes (Signed)
Cardiology Office Note  Date: 08/07/2018   ID: Cody Velez, DOB May 14, 1946, MRN [id number]  PCP: Asencion Noble, MD  Primary Cardiologist: Rozann Lesches, MD   Chief Complaint  Patient presents with  . Cardiac follow-up    History of Present Illness: Cody Velez is a 72 y.o. male last seen in November 2018.  He is here for a follow-up visit.  From a cardiac perspective he reports no angina symptoms, no palpitations or syncope.  He continues to follow with Dr. Megan Salon in the infectious disease clinic with history of MSSA bacteremia and thoracic discitis.  He remains on oral antibiotics including Keflex and rifampin, considering staying on chronic therapy.  He is at home now with plan to continue rehabilitation after an inpatient stay through the [facility] system.  He also tells me that he completed treatment for lower extremity DVTs, no longer on anticoagulation.  Blood pressure is low normal today, he is asymptomatic.  We did go over his medications.  If trend stays low, he could always consider cutting back either Norvasc or Cozaar.  He continues to follow with Dr. Willey Blade.  Past Medical History:  Diagnosis Date  . Allergic rhinitis   . Arthritis   . B12 deficiency   . Cervicogenic headache 01/28/2016  . Childhood asthma   . Chronic back pain   . Coronary atherosclerosis of native coronary artery    Mild nonobstructive CAD at catheterization January 2015  . Depression   . Essential hypertension   . Falls   . GERD (gastroesophageal reflux disease)   . History of cerebrovascular disease 07/23/2015  . History of pneumonia 02/2011  . Hyperlipidemia   . Kidney stone   . Memory difficulty 07/23/2015  . OSA (obstructive sleep apnea)    CPAP - Dr. [name]  . Prostate cancer ([place])   . PTSD (post-traumatic stress disorder)    [place]  . Rectal bleeding     Past Surgical History:  Procedure Laterality Date  . APPLICATION OF ROBOTIC ASSISTANCE FOR SPINAL PROCEDURE N/A  03/28/2018   Procedure: APPLICATION OF ROBOTIC ASSISTANCE FOR SPINAL PROCEDURE;  Surgeon: Kristeen Miss, MD;  Location: [facility];  Service: Neurosurgery;  Laterality: N/A;  . BACK SURGERY  02/14/12   lumbar OR #7; "today redid L1L2; replaced screws; added bone from hip"  . BILATERAL KNEE ARTHROSCOPY    . COLONOSCOPY  10/15/2008   Dr. [name]: tubular adenoma   . COLONOSCOPY  12/17/2003   QPR:FFMBWG rectal and colon  . COLONOSCOPY N/A 09/05/2014   Procedure: COLONOSCOPY;  Surgeon: Daneil Dolin, MD;  [facility];  Service: Endoscopy;  Laterality: N/A;  7:30-rescheduled 9/17 to [place] notified pt  . CYSTOSCOPY WITH STENT PLACEMENT Right 01/27/2016   Procedure: CYSTOSCOPY WITH STENT PLACEMENT;  Surgeon: [name], MD;  Location: [facility] OR[facility];  Service: Urology;  Laterality: Right;  . CYSTOSCOPY/RETROGRADE/URETEROSCOPY/STONE EXTRACTION WITH BASKET Right 01/27/2016   Procedure: CYSTOSCOPY, RIGHT RETROGRADE, RIGHT URETEROSCOPY, STONE EXTRACTION ;  Surgeon: [name], MD;  Location: [facility];  Service: Urology;  Laterality: Right;  . ESOPHAGOGASTRODUODENOSCOPY  10/15/2008     Dr Rourk:Schatzki's ring status post dilation and disruption via 51 F Maloney dilator/ otherwise unremarkable esophagus, small hiatal hernia, multiple fundal gland polyps not manipulated, gastritis, negative H.pylori  . ESOPHAGOGASTRODUODENOSCOPY  06/21/02   YKZ:LDJTT sliding hiatal hernia with mild changes of reflux esophagitis limited to gastroesophageal junction.  Noncritical ring at distal esophagus, 3 cm proximal to gastroesophageal junction/Antral gastritis  .  [place]   "broke face playing softball"  . FRACTURE SURGERY     "left wrist; broke it; took spur off"  . HOLMIUM LASER APPLICATION Right 12/25/1094   Procedure: HOLMIUM LASER APPLICATION;  Surgeon: [name], MD;  Location: [facility];  Service: Urology;  Laterality: Right;  . KNEE ARTHROSCOPY Right 05/18/2018   Procedure:  PARTIAL MEDIAL MENISECTOMY AND SURGICAL LAVAGE AND CHONDROPLASTY;  Surgeon: [name], MD;  Location: [facility];  Service: Orthopedics;  Laterality: Right;  . LEFT HEART CATHETERIZATION WITH CORONARY ANGIOGRAM N/A 12/27/2013   Procedure: LEFT HEART CATHETERIZATION WITH CORONARY ANGIOGRAM;  Surgeon: [name], MD;  Location: [facility] CATH LAB;  Service: Cardiovascular;  Laterality: N/A;  . neck epidural    . POSTERIOR LUMBAR FUSION 4 LEVEL N/A 03/28/2018   Procedure: Thoracic eight -Lumbar two- FIXATION WITH SCREW PLACEMENT, DECOMPRESSION Thoracic ten-Thoracic eleven  FOR OSTEOMYELITIS;  Surgeon: Kristeen Miss, MD;  Location: [facility];  Service: Neurosurgery;  Laterality: N/A;  . SHOULDER SURGERY Bilateral   . TEE WITHOUT CARDIOVERSION N/A 03/21/2018   Procedure: TRANSESOPHAGEAL ECHOCARDIOGRAM (TEE) WITH PROPOFOL;  Surgeon: [name], MD;  [facility];  Service: Cardiovascular;  Laterality: N/A;    Current Outpatient Medications  Medication Sig Dispense Refill  . amLODipine (NORVASC) 10 MG tablet Take 1 tablet (10 mg total) by mouth daily. 30 tablet 0  . aspirin 81 MG tablet Take 1 tablet (81 mg total) by mouth daily. 30 tablet 0  . Baclofen 5 MG TABS Take 5 mg by mouth 3 (three) times daily. 30 tablet 0  . cephALEXin (KEFLEX) 500 MG capsule Take 1 capsule (500 mg total) by mouth 3 (three) times daily. 90 capsule 3  . colchicine 0.6 MG tablet Take 1 tablet (0.6 mg total) by mouth daily. 30 tablet 0  . docusate sodium (COLACE) 100 MG capsule Take 2 capsules (200 mg total) by mouth 2 (two) times daily. 10 capsule 0  . donepezil (ARICEPT) 10 MG tablet Take 1 tablet (10 mg total) by mouth at bedtime. 30 tablet 0  . finasteride (PROSCAR) 5 MG tablet Take 1 tablet (5 mg total) by mouth daily. Reported on 05/04/2016 30 tablet 0  . fluticasone (FLONASE) 50 MCG/ACT nasal spray Place 1 spray into both nostrils daily as needed for allergies. 16 g 2  . gabapentin (NEURONTIN) 400 MG capsule Take 1  capsule (400 mg total) by mouth 3 (three) times daily. 90 capsule 0  . latanoprost (XALATAN) 0.005 % ophthalmic solution Place 1 drop into both eyes at bedtime. 2.5 mL 0  . losartan (COZAAR) 100 MG tablet Take 100 mg by mouth daily.    . Melatonin 3 MG TABS Take 0.5 tablets (1.5 mg total) by mouth at bedtime. 30 tablet 0  . memantine (NAMENDA) 10 MG tablet Take 1 tablet (10 mg total) by mouth 2 (two) times daily. 60 tablet 0  . omeprazole (PRILOSEC) 20 MG capsule Take 2 capsules (40 mg total) by mouth daily. 30 capsule 0  . Oxycodone HCl 10 MG TABS Take 1 tablet (10 mg total) by mouth every 4 (four) hours as needed (moderate to severe pain). 15 tablet 0  . polyethylene glycol (MIRALAX / GLYCOLAX) packet Take 17 g by mouth daily. 14 each 0  . potassium chloride SA (K-DUR,KLOR-CON) 20 MEQ tablet Take 1 tablet (20 mEq total) by mouth daily. 30 tablet 0  . pravastatin (PRAVACHOL) 10 MG tablet Take 1 tablet (10 mg total) by  mouth every evening. 30 tablet 0  . rifampin (RIFADIN) 300 MG capsule Take 1 capsule (300 mg total) by mouth every 12 (twelve) hours. Continue through 06/18/2018 and then to be determined by ID clinic 60 capsule 3  . traZODone (DESYREL) 100 MG tablet Take 1 tablet (100 mg total) by mouth at bedtime. 30 tablet 0  . venlafaxine XR (EFFEXOR-XR) 150 MG 24 hr capsule Take 1 capsule (150 mg total) by mouth 2 (two) times daily. 60 capsule 0  . vitamin B-12 (CYANOCOBALAMIN) 1000 MCG tablet Take 1 tablet (1,000 mcg total) by mouth daily. 30 tablet 0  . baclofen (LIORESAL) 10 MG tablet TAKE ONE TABLET IN THE MORNING AND MIDDAY, THEN TWO TABLETS AT NIGHT 360 each 3  . cycloSPORINE (RESTASIS) 0.05 % ophthalmic emulsion 1 DROP INTO BOTH EYES TWICE A DAY FOR DRY EYES 0.4 mL 0  . Dalteparin Sodium (FRAGMIN) 16109 UNT/0.72ML SOLN Inject 0.72 mLs into the skin.    . diazepam (VALIUM) 5 MG tablet Take 1 tablet (5 mg total) by mouth every 6 (six) hours as needed for muscle spasms. 30 tablet 0   No  current facility-administered medications for this visit.    Allergies:  Patient has no known allergies.   Social History: The patient  reports that he quit smoking about 59 years ago. His smoking use included cigarettes. He has quit using smokeless tobacco.  His smokeless tobacco use included chew. He reports that he drinks alcohol. He reports that he does not use drugs.   ROS:  Please see the history of present illness. Otherwise, complete review of systems is positive for unsteadiness on his feet, weakness, uses a wheelchair and a walker.  All other systems are reviewed and negative.   Physical Exam: VS:  BP 100/68   Pulse 90   Ht 6\' 3"  (1.905 m)   Wt 285 lb (129.3 kg)   SpO2 96%   BMI 35.62 kg/m , BMI Body mass index is 35.62 kg/m.  Wt Readings from Last 3 Encounters:  08/07/18 285 lb (129.3 kg)  07/18/18 277 lb (125.6 kg)  05/22/18 285 lb 7.9 oz (129.5 kg)    General: Obese male, seated in wheelchair. HEENT: Conjunctiva and lids normal, oropharynx clear. Neck: Supple, no elevated JVP or carotid bruits, no thyromegaly. Lungs: Clear to auscultation, nonlabored breathing at rest. Cardiac: Regular rate and rhythm with occasional ectopy, no S3 or significant systolic murmur, no pericardial rub. Abdomen: Soft, nontender, bowel sounds present, no guarding or rebound. Extremities: Trace ankle edema, distal pulses 2+. Skin: Warm and dry. Musculoskeletal: No kyphosis. Neuropsychiatric: Alert and oriented x3, affect grossly appropriate.  ECG: I personally reviewed the tracing from 03/15/2018 which showed sinus tachycardia with left anterior fascicular block and PAC.  Recent Labwork: 03/17/2018: B Natriuretic Peptide 61.0 05/09/2018: TSH 0.867 05/11/2018: Magnesium 2.0 05/20/2018: ALT 10; AST 17 05/22/2018: BUN 10; Creatinine, Ser 0.87; Hemoglobin 10.5; Platelets 842; Potassium 3.9; Sodium 139   Other Studies Reviewed Today:  TEE 03/21/2018: Study Conclusions  - Procedure narrative:  Transesophageal echocardiography. Topical   anesthesia was obtained using viscous lidocaine. A   transesophageal probe was inserted by the attending cardiologist.   Image quality was poor. This was a technically difficult study. - Left ventricle: Systolic function was normal. The estimated   ejection fraction was in the range of 55% to 60%. - Descending aorta: The descending aorta had moderate diffuse   disease. - Mitral valve: There was trivial regurgitation. - Left atrium: No evidence of  thrombus in the atrial cavity or   appendage. - Tricuspid valve: There was trivial regurgitation. - Pulmonic valve: There was trivial regurgitation.  Impressions:  - Images were overall limited. Valves best visualized with   nonstandard views. There were no obvious valvular vegetations.  Assessment and Plan:  1.  History of mild nonobstructive coronary artery disease by cardiac catheterization in 2015.  He remains asymptomatic and is being managed medically.  No changes were made today.  Continue aspirin and Pravachol.  2.  Essential hypertension.  Blood pressure low normal today.  He is asymptomatic.  Continue Norvasc and Cozaar, doses could be cut back if needed.  Keep follow-up with Dr. Willey Blade.  3.  Mixed hyperlipidemia, on Pravachol.  4.  History of MSSA bacteremia and thoracic discitis, continues on Keflex and rifampin under the direction of Dr. Megan Salon at this point.  Current medicines were reviewed with the patient today.  Disposition: Follow-up in 6 months.  Signed, [name], MD, [facility] 08/07/2018 4:41 PM    [facility] [place], [place], [place] [address] Phone: [phone]; Fax: [phone]

## 2018-08-15 ENCOUNTER — Ambulatory Visit (HOSPITAL_COMMUNITY): Payer: Medicare Other | Attending: Internal Medicine | Admitting: Physical Therapy

## 2018-08-15 ENCOUNTER — Other Ambulatory Visit: Payer: Self-pay

## 2018-08-15 ENCOUNTER — Encounter (HOSPITAL_COMMUNITY): Payer: Self-pay | Admitting: Physical Therapy

## 2018-08-15 DIAGNOSIS — R29898 Other symptoms and signs involving the musculoskeletal system: Secondary | ICD-10-CM | POA: Diagnosis not present

## 2018-08-15 DIAGNOSIS — R2689 Other abnormalities of gait and mobility: Secondary | ICD-10-CM

## 2018-08-15 DIAGNOSIS — M6281 Muscle weakness (generalized): Secondary | ICD-10-CM | POA: Insufficient documentation

## 2018-08-15 NOTE — Therapy (Signed)
[place], [place], [address] Phone: [phone]   Fax:  [phone]  Physical Therapy Evaluation  Patient Details  Name: Cody Velez MRN: [id number] Date of Birth: 02/15/1946 Referring Provider: Asencion Noble, MD   Encounter Date: 08/15/2018  PT End of Session - 08/15/18 1322    Visit Number  1    Number of Visits  25    Date for PT Re-Evaluation  10/13/18   Mini re-assess 09/12/18   Authorization Type  Primary: Medicare; Secondary: BCBS (28 visits per calendar year with 14 used at evaluation)    Authorization Time Period  08/15/18 - 10/14/18    Authorization - Visit Number  15    Authorization - Number of Visits  75    PT Start Time  0816    PT Stop Time  0900    PT Time Calculation (min)  44 min    Activity Tolerance  Patient tolerated treatment well;No increased pain    Behavior During Therapy  WFL for tasks assessed/performed       Past Medical History:  Diagnosis Date  . Allergic rhinitis   . Arthritis   . B12 deficiency   . Cervicogenic headache 01/28/2016  . Childhood asthma   . Chronic back pain   . Coronary atherosclerosis of native coronary artery    Mild nonobstructive CAD at catheterization January 2015  . Depression   . Essential hypertension   . Falls   . GERD (gastroesophageal reflux disease)   . History of cerebrovascular disease 07/23/2015  . History of pneumonia 02/2011  . Hyperlipidemia   . Kidney stone   . Memory difficulty 07/23/2015  . OSA (obstructive sleep apnea)    CPAP - Dr. [name]  . Prostate cancer ([place])   . PTSD (post-traumatic stress disorder)    [place]  . Rectal bleeding     Past Surgical History:  Procedure Laterality Date  . APPLICATION OF ROBOTIC ASSISTANCE FOR SPINAL PROCEDURE N/A 03/28/2018   Procedure: APPLICATION OF ROBOTIC ASSISTANCE FOR SPINAL PROCEDURE;  Surgeon: Kristeen Miss, MD;  Location: [facility];  Service: Neurosurgery;  Laterality: N/A;  . BACK SURGERY   02/14/12   lumbar OR #7; "today redid L1L2; replaced screws; added bone from hip"  . BILATERAL KNEE ARTHROSCOPY    . COLONOSCOPY  10/15/2008   Dr. [name]: tubular adenoma   . COLONOSCOPY  12/17/2003   JSH:FWYOVZ rectal and colon  . COLONOSCOPY N/A 09/05/2014   Procedure: COLONOSCOPY;  Surgeon: Daneil Dolin, MD;  [facility];  Service: Endoscopy;  Laterality: N/A;  7:30-rescheduled 9/17 to [place] notified pt  . CYSTOSCOPY WITH STENT PLACEMENT Right 01/27/2016   Procedure: CYSTOSCOPY WITH STENT PLACEMENT;  Surgeon: [name], MD;  Location: [facility];  Service: Urology;  Laterality: Right;  . CYSTOSCOPY/RETROGRADE/URETEROSCOPY/STONE EXTRACTION WITH BASKET Right 01/27/2016   Procedure: CYSTOSCOPY, RIGHT RETROGRADE, RIGHT URETEROSCOPY, STONE EXTRACTION ;  Surgeon: [name], MD;  Location: [facility];  Service: Urology;  Laterality: Right;  . ESOPHAGOGASTRODUODENOSCOPY  10/15/2008     Dr Rourk:Schatzki's ring status post dilation and disruption via 42 F Maloney dilator/ otherwise unremarkable esophagus, small hiatal hernia, multiple fundal gland polyps not manipulated, gastritis, negative H.pylori  . ESOPHAGOGASTRODUODENOSCOPY  06/21/02   CHY:IFOYD sliding hiatal hernia with mild changes of reflux esophagitis limited to gastroesophageal junction.  Noncritical ring at distal esophagus, 3 cm proximal to gastroesophageal junction/Antral gastritis  . [place]   "  broke face playing softball"  . FRACTURE SURGERY     "left wrist; broke it; took spur off"  . HOLMIUM LASER APPLICATION Right 01/24/972   Procedure: HOLMIUM LASER APPLICATION;  Surgeon: [name], MD;  Location: [facility];  Service: Urology;  Laterality: Right;  . KNEE ARTHROSCOPY Right 05/18/2018   Procedure: PARTIAL MEDIAL MENISECTOMY AND SURGICAL LAVAGE AND CHONDROPLASTY;  Surgeon: [name], MD;  Location: [facility];  Service: Orthopedics;  Laterality: Right;  . LEFT HEART  CATHETERIZATION WITH CORONARY ANGIOGRAM N/A 12/27/2013   Procedure: LEFT HEART CATHETERIZATION WITH CORONARY ANGIOGRAM;  Surgeon: [name], MD;  Location: [facility];  Service: Cardiovascular;  Laterality: N/A;  . neck epidural    . POSTERIOR LUMBAR FUSION 4 LEVEL N/A 03/28/2018   Procedure: Thoracic eight -Lumbar two- FIXATION WITH SCREW PLACEMENT, DECOMPRESSION Thoracic ten-Thoracic eleven  FOR OSTEOMYELITIS;  Surgeon: Kristeen Miss, MD;  Location: [facility];  Service: Neurosurgery;  Laterality: N/A;  . SHOULDER SURGERY Bilateral   . TEE WITHOUT CARDIOVERSION N/A 03/21/2018   Procedure: TRANSESOPHAGEAL ECHOCARDIOGRAM (TEE) WITH PROPOFOL;  Surgeon: [name], MD;  [facility];  Service: Cardiovascular;  Laterality: N/A;    There were no vitals filed for this visit.   Subjective Assessment - 08/15/18 0845    Subjective  Patient reported that on 03/15/18 he lost the use of both of his legs trying to get out of his car. He went to [facility], and then to [facility]. He had back surgery due to infection in his spine. Then he had inpatient rehabilitation until May 06, 2018. After discharge from inpatient he reported that he had 2 falls. He then had knee surgery in his right knee, a scope due to one of the falls. Then he went to more inpatient therapy. Since that time he has had 1 fall. He then started home health physical therapy. Patient reported that his home health therapy ended 08/04/18. He stated he has been on antibiotics for his back since his surgery. He stated he had blood clots in each leg but received treatment for this. He stated the blood clots are cleared as far as he knows. Patient reported that he can transfer on his own. Patient reported the last he has walked was 2 weeks ago with a walker and walked 147 feet. He reported that he is starting to feel like that his legs are buckling a lot. Patient reported that he has some strength in his legs, but that he  has numbness in his legs. Patient stated that he does have some constipation and that his physician is aware of this. Patient reported that he is still on antibiotics and sees his infection physician sometime in September.     Pertinent History  T10-11 Epidural Abscess, back surgery on 03/28/18, right knee partial medial menisectomy and chondroplasty    How long can you sit comfortably?  Not limited    How long can you stand comfortably?  5 minutes holding onto something (90 seconds without)    How long can you walk comfortably?  With walker 0 minutes    Diagnostic tests  CT Lumbar Spine 05/12/18: "Spinal stimulator device, along with multilevel multisegment fusion hardware appear appropriately placed without adverse features."    Patient Stated Goals  To walk    Currently in Pain?  No/denies    Multiple Pain Sites  No         [facility] PT Assessment - 08/15/18 0001  Assessment   Medical Diagnosis  T10-11 Epidural Abscess "Paraplegia"    Referring Provider  Asencion Noble, MD    Onset Date/Surgical Date  03/28/18   Back surgery   Prior Therapy  Yes, inpatient rehab and home health which patient reported ended 08/04/18      Precautions   Precautions  --   Possible back precautions     Balance Screen   Has the patient fallen in the past 6 months  Yes    How many times?  3    Has the patient had a decrease in activity level because of a fear of falling?   Yes    Is the patient reluctant to leave their home because of a fear of falling?   No      Home Environment   Living Environment  Private residence    Living Arrangements  Spouse/significant other    Type of [place] entrance    [place] to live on main level with bedroom/bathroom    [place] - 2 wheels;Walker - 4 wheels;Wheelchair - manual      Prior Function   Level of Independence  Independent;Independent with basic ADLs    Vocation  Retired      Charity fundraiser  Status  Within Functional Limits for tasks assessed      Observation/Other Assessments   Observations  No noted redness or heat noted in bilateral calves    Focus on Therapeutic Outcomes (FOTO)   46% (54% limited)      Sensation   Light Touch  Impaired by gross assessment    Additional Comments  Patient reported he could feel the pressure of my hands and the temperature      ROM / Strength   AROM / PROM / Strength  Strength      Strength   Strength Assessment Site  Hip;Knee;Ankle    Right/Left Hip  Right;Left    Right Hip Flexion  4/5    Right Hip Extension  2+/5    Right Hip ABduction  3/5    Left Hip Flexion  4/5    Left Hip Extension  2+/5    Left Hip ABduction  3/5    Right/Left Knee  Right;Left    Right Knee Flexion  4/5    Right Knee Extension  3+/5   Partial range   Left Knee Flexion  4/5    Left Knee Extension  4/5    Right/Left Ankle  Right;Left    Right Ankle Dorsiflexion  4+/5    Left Ankle Dorsiflexion  4+/5      Flexibility   Soft Tissue Assessment /Muscle Length  yes    Hamstrings  Noted increased tightness bilaterally, with legs straightened      Bed Mobility   Bed Mobility  Sit to Supine;Supine to Sit;Rolling Right;Rolling Left    Rolling Right  Supervision/verbal cueing    Rolling Left  Supervision/Verbal cueing    Supine to Sit  Supervision/Verbal cueing    Sit to Supine  Supervision/Verbal cueing      Transfers   Transfers  Squat Pivot Transfers    Squat Pivot Transfers  6: Modified independent (Device/Increase time)    Comments  Patient transferred from sitting in wheelchair to sitting at edge of mat with increased time      Ambulation/Gait   Gait Comments  (Assess at next session)  Objective measurements completed on examination: See above findings.              PT Education - 08/15/18 1315    Education Details  Patient was educated on examination findings and plan of care.     Person(s) Educated  Patient     Methods  Explanation    Comprehension  Verbalized understanding       PT Short Term Goals - 08/15/18 1326      PT SHORT TERM GOAL #1   Title  Patient will report understanding and regular compliance with HEP to improve strength and overall functional mobility.     Time  4    Period  Weeks    Status  New    Target Date  09/12/18      PT SHORT TERM GOAL #2   Title  Patient will demonstrate improvement of 1/2 MMT strength grade in all muscle groups tested to assist patient with safe transfers, ambulation, and standing.     Time  4    Period  Weeks    Status  New    Target Date  09/12/18      PT SHORT TERM GOAL #3   Title  Patient will report or demonstrate ability to perform ambulation for 2 minutes with LRAD with no more than supervision assistance without rest break and reported no more than minimal difficulty.     Time  4    Period  Weeks    Status  New    Target Date  09/12/18        PT Long Term Goals - 08/15/18 1327      PT LONG TERM GOAL #1   Title  Patient will report or demonstrate ability to perform ambulation for 5 minutes with LRAD with no more than supervision assistance without rest break and reported no more than minimal difficulty.     Time  8    Period  Weeks    Status  New    Target Date  10/10/18      PT LONG TERM GOAL #2   Title  Patient will report ability to stand at a support surface for 10 minutes without a rest break in order to perform household chores with increased ease.     Time  8    Period  Weeks    Status  New    Target Date  10/10/18      PT LONG TERM GOAL #3   Title  Patient will demonstrate an improvement of 10% on FOTO indicating improvement in percieved functional mobility.     Time  8    Period  Weeks    Status  New    Target Date  10/10/18             Plan - 08/15/18 1746    Clinical Impression Statement  Patient is a 72 year old male who presented to physical therapy with history of epidural abscess and back fusion  on 03/28/18 and resultant paraplegia. Patient had inpatient physical therapy as well as home health physical therapy. Upon examination, patient demonstrated decreased strength in bilateral lower extremities. Patient demonstrated squat pivot transfers with modified independence and increased time at evaluation. Patient reported a high level of difficulty with ambulation and stated he requires a rolling walker to ambulate any distance at this time. In addition, patient reported a high level of difficulty with standing and reported required support to stand for 5 minutes. Patient's bilateral  hamstrings appeared tight as patient was unable to straighten knees fully. Patient's FOTO score was a 46% and 54% limited indicating level of perceived functional mobility. Patient would benefit from skilled physical therapy in order to address the abovementioned deficits to improve patient's overall functional mobility and improve quality of life.     History and Personal Factors relevant to plan of care:  Epidural abscess with resultatant paraplegia, back fusion, HTN, history of DVT bilateral legs, spinal cord stimulator, cancer, right knee scope, posterior lumbar fusion 03/28/18, history of back surgeries    Clinical Presentation  Stable    Clinical Presentation due to:  FOTO, MMT, transfers, clinical judgement    Clinical Decision Making  High    Rehab Potential  Fair    Clinical Impairments Affecting Rehab Potential  Positive: Highly motivated; Negative: Comorbidities    PT Frequency  3x / week    PT Duration  8 weeks    PT Treatment/Interventions  ADLs/Self Care Home Management;Aquatic Therapy;DME Instruction;Gait training;Functional mobility training;Stair training;Therapeutic activities;Therapeutic exercise;Balance training;Neuromuscular re-education;Patient/family education;Orthotic Fit/Training;Wheelchair mobility training;Manual techniques;Passive range of motion;Energy conservation;Dry needling;Splinting;Taping     PT Next Visit Plan  Review evaluation and goals, monitor patient's blood pressure as needed. Adhere to BLT precautions until follow-up with physician. Work on ambulation, transfers, and functional lower extremity strengthening. Initiate HEP including lower extremity strengthening.     PT Home Exercise Plan  Initiate at first treatment session    Consulted and Agree with Plan of Care  Patient       Patient will benefit from skilled therapeutic intervention in order to improve the following deficits and impairments:  Abnormal gait, Decreased balance, Decreased endurance, Decreased mobility, Difficulty walking, Hypomobility, Decreased range of motion, Decreased activity tolerance, Decreased strength, Impaired flexibility  Visit Diagnosis: Other abnormalities of gait and mobility  Other symptoms and signs involving the musculoskeletal system  Muscle weakness (generalized)     Problem List Patient Active Problem List   Diagnosis Date Noted  . Pseudogout of knee   . Pyogenic arthritis of right knee joint ([place])   . Falls frequently 05/08/2018  . Fall   . Sleep disturbance   . Post-op pain   . Hypokalemia   . Morbid obesity ([place])   . Postoperative pain   . DVT, lower extremity, distal, acute, bilateral ([place])   . Acute blood loss anemia   . Anemia of chronic disease   . Essential hypertension   . Bacteremia   . Myelopathy ([place]) 03/30/2018  . Paraplegia ([place])   . Postlaminectomy syndrome, lumbar region   . Epidural abscess   . Abdominal distension   . Encephalopathy   . Weakness of both lower extremities   . Discitis of thoracic region   . Spinal cord stimulator status   . Staphylococcus aureus sepsis ([place]) 03/20/2018  . Bacteremia due to methicillin susceptible Staphylococcus aureus (MSSA) 03/18/2018  . Obesity 03/18/2018  . Nephrolithiasis 03/16/2018  . AKI (acute kidney injury) ([place]) 03/16/2018  . Cognitive impairment 03/16/2018  . Chronic pain syndrome 03/16/2018  . B12  deficiency 01/28/2016  . Memory difficulty 07/23/2015  . History of cerebrovascular disease 07/23/2015  . FH: colon cancer 08/13/2014  . Hx of adenomatous colonic polyps 08/13/2014  . Chronic back pain 05/30/2012  . CAD, NATIVE VESSEL 07/17/2010  . HYPERCHOLESTEROLEMIA 10/31/2009  . SMOKELESS TOBACCO ABUSE 10/31/2009  . Obstructive sleep apnea 10/31/2009  . Essential hypertension, benign 10/31/2009  . GERD 10/31/2009   Clarene Critchley PT, DPT 6:00 PM, 08/15/18 [phone]  [place], [place], [address] Phone: [phone]   Fax:  [phone]  Name: Cody Velez MRN: [id number] Date of Birth: 1946-04-19

## 2018-08-17 ENCOUNTER — Encounter (HOSPITAL_COMMUNITY): Payer: Self-pay

## 2018-08-17 ENCOUNTER — Ambulatory Visit (HOSPITAL_COMMUNITY): Payer: Medicare Other

## 2018-08-17 DIAGNOSIS — R2689 Other abnormalities of gait and mobility: Secondary | ICD-10-CM | POA: Diagnosis not present

## 2018-08-17 DIAGNOSIS — M6281 Muscle weakness (generalized): Secondary | ICD-10-CM | POA: Diagnosis not present

## 2018-08-17 DIAGNOSIS — R29898 Other symptoms and signs involving the musculoskeletal system: Secondary | ICD-10-CM

## 2018-08-17 NOTE — Patient Instructions (Signed)
Toe / Heel Raise (Sitting)    Sitting, raise heels, then rock back on heels and raise toes. Repeat 10-20 times.  Copyright  VHI. All rights reserved.   Long [employer]    Straighten operated leg and try to hold it 5 seconds. Use ____ lbs on ankle. Repeat 10-20 times. Do 2 sessions a day.  http://gt2.exer.us/310   Copyright  VHI. All rights reserved.   Marching    Alternate lifting knees as high as is comfortable, as if marching. Repeat 10-20 times each leg. Do 2 sessions per day. Note: If possible place feet on floor.  Copyright  VHI. All rights reserved.   ABDUCTION: Sitting - Exercise Ball: Resistance Band (Active)    Sit with feet flat. Lift right leg slightly and, against blue resistance band, draw it out to side. Complete 2 sets of 10 repetitions. Perform 2 sessions per day.  Copyright  VHI. All rights reserved.   Bridging    Slowly raise buttocks from floor, keeping stomach tight. Repeat 10-20 times per set. Do 1-2 sets per session. Do 2 sessions per day.  http://orth.exer.us/1096   Copyright  VHI. All rights reserved.

## 2018-08-17 NOTE — Therapy (Signed)
[place], [place], [address] Phone: [phone]   Fax:  [phone]  Physical Therapy Treatment  Patient Details  Name: Cody Velez MRN: [id number] Date of Birth: 12/12/1946 Referring Provider: Asencion Noble, MD   Encounter Date: 08/17/2018  PT End of Session - 08/17/18 1156    Visit Number  2    Number of Visits  25    Date for PT Re-Evaluation  10/13/18   Minireassess 09/12/18   Authorization Type  Primary: Medicare; Secondary: BCBS (75 visits per calendar year with 14 used at evaluation)    Authorization Time Period  08/15/18 - 10/14/18    Authorization - Visit Number  16    Authorization - Number of Visits  75    PT Start Time  1517    PT Stop Time  1125    PT Time Calculation (min)  47 min    Equipment Utilized During Treatment  Gait belt    Activity Tolerance  Patient tolerated treatment well;No increased pain;Patient limited by fatigue    Behavior During Therapy  [facility] for tasks assessed/performed       Past Medical History:  Diagnosis Date  . Allergic rhinitis   . Arthritis   . B12 deficiency   . Cervicogenic headache 01/28/2016  . Childhood asthma   . Chronic back pain   . Coronary atherosclerosis of native coronary artery    Mild nonobstructive CAD at catheterization January 2015  . Depression   . Essential hypertension   . Falls   . GERD (gastroesophageal reflux disease)   . History of cerebrovascular disease 07/23/2015  . History of pneumonia 02/2011  . Hyperlipidemia   . Kidney stone   . Memory difficulty 07/23/2015  . OSA (obstructive sleep apnea)    CPAP - Dr. [name]  . Prostate cancer ([place])   . PTSD (post-traumatic stress disorder)    [place]  . Rectal bleeding     Past Surgical History:  Procedure Laterality Date  . APPLICATION OF ROBOTIC ASSISTANCE FOR SPINAL PROCEDURE N/A 03/28/2018   Procedure: APPLICATION OF ROBOTIC ASSISTANCE FOR SPINAL PROCEDURE;  Surgeon: Kristeen Miss, MD;   Location: [facility];  Service: Neurosurgery;  Laterality: N/A;  . BACK SURGERY  02/14/12   lumbar OR #7; "today redid L1L2; replaced screws; added bone from hip"  . BILATERAL KNEE ARTHROSCOPY    . COLONOSCOPY  10/15/2008   Dr. [name]: tubular adenoma   . COLONOSCOPY  12/17/2003   OHY:WVPXTG rectal and colon  . COLONOSCOPY N/A 09/05/2014   Procedure: COLONOSCOPY;  Surgeon: Daneil Dolin, MD;  [facility];  Service: Endoscopy;  Laterality: N/A;  7:30-rescheduled 9/17 to [place] notified pt  . CYSTOSCOPY WITH STENT PLACEMENT Right 01/27/2016   Procedure: CYSTOSCOPY WITH STENT PLACEMENT;  Surgeon: [name], MD;  Location: [facility];  Service: Urology;  Laterality: Right;  . CYSTOSCOPY/RETROGRADE/URETEROSCOPY/STONE EXTRACTION WITH BASKET Right 01/27/2016   Procedure: CYSTOSCOPY, RIGHT RETROGRADE, RIGHT URETEROSCOPY, STONE EXTRACTION ;  Surgeon: [name], MD;  Location: [facility];  Service: Urology;  Laterality: Right;  . ESOPHAGOGASTRODUODENOSCOPY  10/15/2008     Dr Rourk:Schatzki's ring status post dilation and disruption via 68 F Maloney dilator/ otherwise unremarkable esophagus, small hiatal hernia, multiple fundal gland polyps not manipulated, gastritis, negative H.pylori  . ESOPHAGOGASTRODUODENOSCOPY  06/21/02   GYI:RSWNI sliding hiatal hernia with mild changes of reflux esophagitis limited to gastroesophageal junction.  Noncritical ring at distal esophagus, 3 cm proximal to  gastroesophageal junction/Antral gastritis  . [place]   "broke face playing softball"  . FRACTURE SURGERY     "left wrist; broke it; took spur off"  . HOLMIUM LASER APPLICATION Right 02/25/1016   Procedure: HOLMIUM LASER APPLICATION;  Surgeon: [name], MD;  Location: [facility];  Service: Urology;  Laterality: Right;  . KNEE ARTHROSCOPY Right 05/18/2018   Procedure: PARTIAL MEDIAL MENISECTOMY AND SURGICAL LAVAGE AND CHONDROPLASTY;  Surgeon: [name], MD;  Location: [facility];  Service: Orthopedics;  Laterality: Right;  . LEFT HEART CATHETERIZATION WITH CORONARY ANGIOGRAM N/A 12/27/2013   Procedure: LEFT HEART CATHETERIZATION WITH CORONARY ANGIOGRAM;  Surgeon: [name], MD;  Location: [facility] CATH LAB;  Service: Cardiovascular;  Laterality: N/A;  . neck epidural    . POSTERIOR LUMBAR FUSION 4 LEVEL N/A 03/28/2018   Procedure: Thoracic eight -Lumbar two- FIXATION WITH SCREW PLACEMENT, DECOMPRESSION Thoracic ten-Thoracic eleven  FOR OSTEOMYELITIS;  Surgeon: Kristeen Miss, MD;  Location: [facility];  Service: Neurosurgery;  Laterality: N/A;  . SHOULDER SURGERY Bilateral   . TEE WITHOUT CARDIOVERSION N/A 03/21/2018   Procedure: TRANSESOPHAGEAL ECHOCARDIOGRAM (TEE) WITH PROPOFOL;  Surgeon: [name], MD;  [facility];  Service: Cardiovascular;  Laterality: N/A;    There were no vitals filed for this visit.  Subjective Assessment - 08/17/18 1038    Subjective  Pt stated he is feeling okay today, no reports of fall or pain.      Patient Stated Goals  To walk    Currently in Pain?  No/denies                       [facility] Adult PT Treatment/Exercise - 08/17/18 0001      Bed Mobility   Bed Mobility  Sit to Supine;Supine to Sit;Rolling Right;Rolling Left    Rolling Right  Supervision/verbal cueing    Rolling Left  Supervision/Verbal cueing    Supine to Sit  Supervision/Verbal cueing    Sit to Supine  Supervision/Verbal cueing      Transfers   Transfers  Sit to Stand;Stand to Sit;Stand Pivot Transfers    Sit to Stand  4: Min assist    Sit to Stand Details  Verbal cues for safe use of DME/AE;Verbal cues for precautions/safety;Verbal cues for technique;Verbal cues for sequencing;Visual cues/gestures for sequencing    Sit to Stand Details (indicate cue type and reason)  cueing for hand placement to assist     Comments  Patient transferred from sitting in wheelchair to sitting at edge of mat with increased time      Ambulation/Gait    Ambulation/Gait  Yes    Ambulation/Gait Assistance  3: Mod assist    Ambulation/Gait Assistance Details  Use of RW and WC pushed behind wiht mod A for safety     Ambulation Distance (Feet)  34 Feet   2 sets   Assistive device  Rolling walker   2wheeled front RW   Gait Pattern  Step-through pattern;Decreased stride length;Decreased dorsiflexion - right;Decreased dorsiflexion - left;Trunk flexed    Ambulation Surface  Level;Indoor    Gait velocity  slow      Exercises   Exercises  Knee/Hip      Knee/Hip Exercises: Standing   Gait Training  52ft x 2 with RW and WC pushed behind for safety      Knee/Hip Exercises: Seated   Long Arc Quad  Both;10 reps    Clamshell with [employer]  10x 5"   Other Seated Knee/Hip Exercises  heel/toe raises    Marching  Both;10 reps;Limitations    Marching Limitations  5" holds alternating    Abduction/Adduction   Both;Limitations    Abd/Adduction Limitations  BTB 5" holds    Sit to Sand  3 sets;with UE support               PT Short Term Goals - 08/15/18 1326      PT SHORT TERM GOAL #1   Title  Patient will report understanding and regular compliance with HEP to improve strength and overall functional mobility.     Time  4    Period  Weeks    Status  New    Target Date  09/12/18      PT SHORT TERM GOAL #2   Title  Patient will demonstrate improvement of 1/2 MMT strength grade in all muscle groups tested to assist patient with safe transfers, ambulation, and standing.     Time  4    Period  Weeks    Status  New    Target Date  09/12/18      PT SHORT TERM GOAL #3   Title  Patient will report or demonstrate ability to perform ambulation for 2 minutes with LRAD with no more than supervision assistance without rest break and reported no more than minimal difficulty.     Time  4    Period  Weeks    Status  New    Target Date  09/12/18        PT Long Term Goals - 08/15/18 1327      PT LONG TERM GOAL #1   Title  Patient will  report or demonstrate ability to perform ambulation for 5 minutes with LRAD with no more than supervision assistance without rest break and reported no more than minimal difficulty.     Time  8    Period  Weeks    Status  New    Target Date  10/10/18      PT LONG TERM GOAL #2   Title  Patient will report ability to stand at a support surface for 10 minutes without a rest break in order to perform household chores with increased ease.     Time  8    Period  Weeks    Status  New    Target Date  10/10/18      PT LONG TERM GOAL #3   Title  Patient will demonstrate an improvement of 10% on FOTO indicating improvement in percieved functional mobility.     Time  8    Period  Weeks    Status  New    Target Date  10/10/18            Plan - 08/17/18 1157    Clinical Impression Statement  Reviewed goals and copy of eval given to pt.  Session focus with transfer/gait training and LE strengthening.  Established HEP for hip and knee strengthening exercises in seated and supine position, pt able to demonstrate and verbalize appropriate mechanics with HEP.  Gait training complete wiht RW and WC pulled behind for safety, pt able to ambulate 34 feet prior fatigue, did reports some discomfort in Lt knee with weight bearing.  Reviewed bed mobility and BLT precautions, pt able to verbalize and demonstrate safe mechanics.    Rehab Potential  Fair    Clinical Impairments Affecting Rehab Potential  Positive: Highly motivated; Negative: Comorbidities  PT Frequency  3x / week    PT Duration  8 weeks    PT Treatment/Interventions  ADLs/Self Care Home Management;Aquatic Therapy;DME Instruction;Gait training;Functional mobility training;Stair training;Therapeutic activities;Therapeutic exercise;Balance training;Neuromuscular re-education;Patient/family education;Orthotic Fit/Training;Wheelchair mobility training;Manual techniques;Passive range of motion;Energy conservation;Dry needling;Splinting;Taping    PT  Next Visit Plan  Assure compliance with HEP.  Progress standing and gait tolerance, monitor patient's blood pressure as needed. Adhere to BLT precautions until follow-up with physician. Work on ambulation, transfers, and functional lower extremity strengthening. Initiate HEP including lower extremity strengthening.     PT Home Exercise Plan  08/16/18:  seated DF/PF, LAQ, marching, abd with theraband and bridge       Patient will benefit from skilled therapeutic intervention in order to improve the following deficits and impairments:  Abnormal gait, Decreased balance, Decreased endurance, Decreased mobility, Difficulty walking, Hypomobility, Decreased range of motion, Decreased activity tolerance, Decreased strength, Impaired flexibility  Visit Diagnosis: Other abnormalities of gait and mobility  Other symptoms and signs involving the musculoskeletal system  Muscle weakness (generalized)     Problem List Patient Active Problem List   Diagnosis Date Noted  . Pseudogout of knee   . Pyogenic arthritis of right knee joint ([place])   . Falls frequently 05/08/2018  . Fall   . Sleep disturbance   . Post-op pain   . Hypokalemia   . Morbid obesity ([place])   . Postoperative pain   . DVT, lower extremity, distal, acute, bilateral ([place])   . Acute blood loss anemia   . Anemia of chronic disease   . Essential hypertension   . Bacteremia   . Myelopathy ([place]) 03/30/2018  . Paraplegia ([place])   . Postlaminectomy syndrome, lumbar region   . Epidural abscess   . Abdominal distension   . Encephalopathy   . Weakness of both lower extremities   . Discitis of thoracic region   . Spinal cord stimulator status   . Staphylococcus aureus sepsis ([place]) 03/20/2018  . Bacteremia due to methicillin susceptible Staphylococcus aureus (MSSA) 03/18/2018  . Obesity 03/18/2018  . Nephrolithiasis 03/16/2018  . AKI (acute kidney injury) ([place]) 03/16/2018  . Cognitive impairment 03/16/2018  . Chronic pain syndrome  03/16/2018  . B12 deficiency 01/28/2016  . Memory difficulty 07/23/2015  . History of cerebrovascular disease 07/23/2015  . FH: colon cancer 08/13/2014  . Hx of adenomatous colonic polyps 08/13/2014  . Chronic back pain 05/30/2012  . CAD, NATIVE VESSEL 07/17/2010  . HYPERCHOLESTEROLEMIA 10/31/2009  . SMOKELESS TOBACCO ABUSE 10/31/2009  . Obstructive sleep apnea 10/31/2009  . Essential hypertension, benign 10/31/2009  . GERD 10/31/2009   Ihor Austin, [place]; [place]  Aldona Lento 08/17/2018, 12:46 PM  [facility] [address] [place], [place], [address] Phone: [phone]   Fax:  [phone]  Name: DARREK LEASURE MRN: [id number] Date of Birth: November 14, 1946

## 2018-08-22 ENCOUNTER — Encounter (HOSPITAL_COMMUNITY): Payer: Self-pay

## 2018-08-22 ENCOUNTER — Ambulatory Visit (HOSPITAL_COMMUNITY): Payer: Medicare Other | Attending: Internal Medicine

## 2018-08-22 DIAGNOSIS — R2689 Other abnormalities of gait and mobility: Secondary | ICD-10-CM

## 2018-08-22 DIAGNOSIS — M6281 Muscle weakness (generalized): Secondary | ICD-10-CM

## 2018-08-22 DIAGNOSIS — R29898 Other symptoms and signs involving the musculoskeletal system: Secondary | ICD-10-CM

## 2018-08-22 NOTE — Therapy (Signed)
[place], [place], [address] Phone: [phone]   Fax:  [phone]  Physical Therapy Treatment  Patient Details  Name: Cody Velez MRN: [id number] Date of Birth: May 13, 1946 Referring Provider: Asencion Noble, MD   Encounter Date: 08/22/2018  PT End of Session - 08/22/18 1126    Visit Number  3    Number of Visits  25    Date for PT Re-Evaluation  10/13/18   Minireassess 09/12/18   Authorization Type  Primary: Medicare; Secondary: BCBS (75 visits per calendar year with 14 used at evaluation)    Authorization Time Period  08/15/18 - 10/14/18    Authorization - Visit Number  17    Authorization - Number of Visits  75    PT Start Time  9379    PT Stop Time  1200    PT Time Calculation (min)  42 min    Equipment Utilized During Treatment  Gait belt    Activity Tolerance  Patient tolerated treatment well;No increased pain;Patient limited by fatigue   No LBP at EOS, was 2/10 at beginning   Behavior During Therapy  [facility] for tasks assessed/performed       Past Medical History:  Diagnosis Date  . Allergic rhinitis   . Arthritis   . B12 deficiency   . Cervicogenic headache 01/28/2016  . Childhood asthma   . Chronic back pain   . Coronary atherosclerosis of native coronary artery    Mild nonobstructive CAD at catheterization January 2015  . Depression   . Essential hypertension   . Falls   . GERD (gastroesophageal reflux disease)   . History of cerebrovascular disease 07/23/2015  . History of pneumonia 02/2011  . Hyperlipidemia   . Kidney stone   . Memory difficulty 07/23/2015  . OSA (obstructive sleep apnea)    CPAP - Dr. [name]  . Prostate cancer ([place])   . PTSD (post-traumatic stress disorder)    [place]  . Rectal bleeding     Past Surgical History:  Procedure Laterality Date  . APPLICATION OF ROBOTIC ASSISTANCE FOR SPINAL PROCEDURE N/A 03/28/2018   Procedure: APPLICATION OF ROBOTIC ASSISTANCE FOR SPINAL  PROCEDURE;  Surgeon: Kristeen Miss, MD;  Location: [facility];  Service: Neurosurgery;  Laterality: N/A;  . BACK SURGERY  02/14/12   lumbar OR #7; "today redid L1L2; replaced screws; added bone from hip"  . BILATERAL KNEE ARTHROSCOPY    . COLONOSCOPY  10/15/2008   Dr. [name]: tubular adenoma   . COLONOSCOPY  12/17/2003   KWI:OXBDZH rectal and colon  . COLONOSCOPY N/A 09/05/2014   Procedure: COLONOSCOPY;  Surgeon: Daneil Dolin, MD;  [facility];  Service: Endoscopy;  Laterality: N/A;  7:30-rescheduled 9/17 to [place] notified pt  . CYSTOSCOPY WITH STENT PLACEMENT Right 01/27/2016   Procedure: CYSTOSCOPY WITH STENT PLACEMENT;  Surgeon: [name], MD;  [facility];  Service: Urology;  Laterality: Right;  . CYSTOSCOPY/RETROGRADE/URETEROSCOPY/STONE EXTRACTION WITH BASKET Right 01/27/2016   Procedure: CYSTOSCOPY, RIGHT RETROGRADE, RIGHT URETEROSCOPY, STONE EXTRACTION ;  Surgeon: [name], MD;  [facility];  Service: Urology;  Laterality: Right;  . ESOPHAGOGASTRODUODENOSCOPY  10/15/2008     Dr Rourk:Schatzki's ring status post dilation and disruption via 87 F Maloney dilator/ otherwise unremarkable esophagus, small hiatal hernia, multiple fundal gland polyps not manipulated, gastritis, negative H.pylori  . ESOPHAGOGASTRODUODENOSCOPY  06/21/02   GDJ:MEQAS sliding hiatal hernia with mild changes of reflux esophagitis limited to gastroesophageal junction.  Noncritical ring at distal esophagus, 3 cm proximal to gastroesophageal junction/Antral gastritis  . [place]   "broke face playing softball"  . FRACTURE SURGERY     "left wrist; broke it; took spur off"  . HOLMIUM LASER APPLICATION Right 07/26/5642   Procedure: HOLMIUM LASER APPLICATION;  Surgeon: [name], MD;  [facility];  Service: Urology;  Laterality: Right;  . KNEE ARTHROSCOPY Right 05/18/2018   Procedure: PARTIAL MEDIAL MENISECTOMY AND SURGICAL LAVAGE AND CHONDROPLASTY;   Surgeon: [name], MD;  Location: [facility];  Service: Orthopedics;  Laterality: Right;  . LEFT HEART CATHETERIZATION WITH CORONARY ANGIOGRAM N/A 12/27/2013   Procedure: LEFT HEART CATHETERIZATION WITH CORONARY ANGIOGRAM;  Surgeon: [name], MD;  Location: [facility] CATH [facility];  Service: Cardiovascular;  Laterality: N/A;  . neck epidural    . POSTERIOR LUMBAR FUSION 4 LEVEL N/A 03/28/2018   Procedure: Thoracic eight -Lumbar two- FIXATION WITH SCREW PLACEMENT, DECOMPRESSION Thoracic ten-Thoracic eleven  FOR OSTEOMYELITIS;  Surgeon: Kristeen Miss, MD;  Location: [facility];  Service: Neurosurgery;  Laterality: N/A;  . SHOULDER SURGERY Bilateral   . TEE WITHOUT CARDIOVERSION N/A 03/21/2018   Procedure: TRANSESOPHAGEAL ECHOCARDIOGRAM (TEE) WITH PROPOFOL;  Surgeon: [name], MD;  [facility];  Service: Cardiovascular;  Laterality: N/A;    There were no vitals filed for this visit.  Subjective Assessment - 08/22/18 1123    Subjective  Pt stated he is feeling good today, a little dull achey pain in middle of lower back.  Has began HEP and stood by counter for 3 minutes.      Pertinent History  T10-11 Epidural Abscess, back surgery on 03/28/18, right knee partial medial menisectomy and chondroplasty    Patient Stated Goals  To walk    Currently in Pain?  Yes    Pain Score  2     Pain Location  Back    Pain Orientation  Lower    Pain Descriptors / Indicators  Aching;Dull    Pain Type  Surgical pain    Pain Onset  More than a month ago    Pain Frequency  Intermittent    Aggravating Factors   rolling over in bed    Pain Relieving Factors  pain meds    Effect of Pain on Daily Activities  limits                       [facility] Adult PT Treatment/Exercise - 08/22/18 0001      Bed Mobility   Bed Mobility  Sit to Supine;Supine to Sit;Rolling Right;Rolling Left    Rolling Right  Supervision/verbal cueing    Rolling Left  Supervision/Verbal cueing    Supine to Sit   Supervision/Verbal cueing    Sit to Supine  Supervision/Verbal cueing      Exercises   Exercises  Knee/Hip      Knee/Hip Exercises: Seated   Long Arc Quad  Both;10 reps    Marching  Both;10 reps;Limitations    Marching Limitations  5" holds alternating on dynadisc    Sit to Sand  with UE support   1HHA, 1 on RW, 8 reps prior c/o Lt knee pain     Knee/Hip Exercises: Supine   Bridges  10 reps    Straight Leg Raises  Both;10 reps    Other Supine Knee/Hip Exercises  clam with RTB      Knee/Hip Exercises: Sidelying   Clams  10 x2  PT Short Term Goals - 08/15/18 1326      PT SHORT TERM GOAL #1   Title  Patient will report understanding and regular compliance with HEP to improve strength and overall functional mobility.     Time  4    Period  Weeks    Status  New    Target Date  09/12/18      PT SHORT TERM GOAL #2   Title  Patient will demonstrate improvement of 1/2 MMT strength grade in all muscle groups tested to assist patient with safe transfers, ambulation, and standing.     Time  4    Period  Weeks    Status  New    Target Date  09/12/18      PT SHORT TERM GOAL #3   Title  Patient will report or demonstrate ability to perform ambulation for 2 minutes with LRAD with no more than supervision assistance without rest break and reported no more than minimal difficulty.     Time  4    Period  Weeks    Status  New    Target Date  09/12/18        PT Long Term Goals - 08/15/18 1327      PT LONG TERM GOAL #1   Title  Patient will report or demonstrate ability to perform ambulation for 5 minutes with LRAD with no more than supervision assistance without rest break and reported no more than minimal difficulty.     Time  8    Period  Weeks    Status  New    Target Date  10/10/18      PT LONG TERM GOAL #2   Title  Patient will report ability to stand at a support surface for 10 minutes without a rest break in order to perform household chores with  increased ease.     Time  8    Period  Weeks    Status  New    Target Date  10/10/18      PT LONG TERM GOAL #3   Title  Patient will demonstrate an improvement of 10% on FOTO indicating improvement in percieved functional mobility.     Time  8    Period  Weeks    Status  New    Target Date  10/10/18            Plan - 08/22/18 1255    Clinical Impression Statement  Reviewed compliance with HEP, pt able to recall and demonstrate with only cueing for increased hold time with bridges and LAQ for strengthening.  Pt able to demonstrate appropraite transitions and proper bed mobility mechanics as well as able to discuss BLT precautions.  Session focus on LE strengthening and lumbar stability.  Added clams, dynamic seated exercises and quadruped SAR for lumbar stability strengtheing.  Pt reports increased pain Lt knee with weight bearing, held gait training this session due to pain with WB.  Resume next session if tolerable.    Rehab Potential  Fair    Clinical Impairments Affecting Rehab Potential  Positive: Highly motivated; Negative: Comorbidities    PT Frequency  3x / week    PT Duration  8 weeks    PT Treatment/Interventions  ADLs/Self Care Home Management;Aquatic Therapy;DME Instruction;Gait training;Functional mobility training;Stair training;Therapeutic activities;Therapeutic exercise;Balance training;Neuromuscular re-education;Patient/family education;Orthotic Fit/Training;Wheelchair mobility training;Manual techniques;Passive range of motion;Energy conservation;Dry needling;Splinting;Taping    PT Next Visit Plan   Progress standing and gait tolerance, monitor patient's blood  pressure as needed. Adhere to BLT precautions until follow-up with physician. Work on ambulation, transfers, and functional lower extremity strengthening.     PT Home Exercise Plan  08/16/18:  seated DF/PF, LAQ, marching, abd with theraband and bridge       Patient will benefit from skilled therapeutic  intervention in order to improve the following deficits and impairments:  Abnormal gait, Decreased balance, Decreased endurance, Decreased mobility, Difficulty walking, Hypomobility, Decreased range of motion, Decreased activity tolerance, Decreased strength, Impaired flexibility  Visit Diagnosis: Other abnormalities of gait and mobility  Other symptoms and signs involving the musculoskeletal system  Muscle weakness (generalized)     Problem List Patient Active Problem List   Diagnosis Date Noted  . Pseudogout of knee   . Pyogenic arthritis of right knee joint ([place])   . Falls frequently 05/08/2018  . Fall   . Sleep disturbance   . Post-op pain   . Hypokalemia   . Morbid obesity ([place])   . Postoperative pain   . DVT, lower extremity, distal, acute, bilateral ([place])   . Acute blood loss anemia   . Anemia of chronic disease   . Essential hypertension   . Bacteremia   . Myelopathy ([place]) 03/30/2018  . Paraplegia ([place])   . Postlaminectomy syndrome, lumbar region   . Epidural abscess   . Abdominal distension   . Encephalopathy   . Weakness of both lower extremities   . Discitis of thoracic region   . Spinal cord stimulator status   . Staphylococcus aureus sepsis ([place]) 03/20/2018  . Bacteremia due to methicillin susceptible Staphylococcus aureus (MSSA) 03/18/2018  . Obesity 03/18/2018  . Nephrolithiasis 03/16/2018  . AKI (acute kidney injury) ([place]) 03/16/2018  . Cognitive impairment 03/16/2018  . Chronic pain syndrome 03/16/2018  . B12 deficiency 01/28/2016  . Memory difficulty 07/23/2015  . History of cerebrovascular disease 07/23/2015  . FH: colon cancer 08/13/2014  . Hx of adenomatous colonic polyps 08/13/2014  . Chronic back pain 05/30/2012  . CAD, NATIVE VESSEL 07/17/2010  . HYPERCHOLESTEROLEMIA 10/31/2009  . SMOKELESS TOBACCO ABUSE 10/31/2009  . Obstructive sleep apnea 10/31/2009  . Essential hypertension, benign 10/31/2009  . GERD 10/31/2009   Cody Velez,  [place]; [place]  Aldona Lento 08/22/2018, 1:20 PM  [facility] [address] [place], [place], [address] Phone: [phone]   Fax:  [phone]  Name: Cody Velez MRN: [id number] Date of Birth: 1946/09/10

## 2018-08-24 ENCOUNTER — Encounter (HOSPITAL_COMMUNITY): Payer: Self-pay | Admitting: Physical Therapy

## 2018-08-24 ENCOUNTER — Ambulatory Visit (HOSPITAL_COMMUNITY): Payer: Medicare Other | Admitting: Physical Therapy

## 2018-08-24 DIAGNOSIS — R29898 Other symptoms and signs involving the musculoskeletal system: Secondary | ICD-10-CM | POA: Diagnosis not present

## 2018-08-24 DIAGNOSIS — R2689 Other abnormalities of gait and mobility: Secondary | ICD-10-CM

## 2018-08-24 DIAGNOSIS — M6281 Muscle weakness (generalized): Secondary | ICD-10-CM

## 2018-08-24 NOTE — Therapy (Signed)
[place] Vele[place], [place], [address] Phone: [phone]   Fax:  [phone]  Physical Therapy Treatment  Patient Details  Name: Cody Velez MRN: [id number] Date of Birth: March 19, 1946 Referring Provider: Asencion Noble, MD   Encounter Date: 08/24/2018  PT End of Session - 08/24/18 1526    Visit Number  4    Number of Visits  25    Date for PT Re-Evaluation  10/13/18   Minireassess 09/12/18   Authorization Type  Primary: Medicare; Secondary: BCBS (75 visits per calendar year with 14 used at evaluation)    Authorization Time Period  08/15/18 - 10/14/18    Authorization - Visit Number  18    Authorization - Number of Visits  62    PT Start Time  5631    PT Stop Time  1515    PT Time Calculation (min)  39 min    Equipment Utilized During Treatment  Gait belt    Activity Tolerance  Patient tolerated treatment well;No increased pain;Patient limited by fatigue   No LBP at EOS, was 2/10 at beginning   Behavior During Therapy  [facility] for tasks assessed/performed       Past Medical History:  Diagnosis Date  . Allergic rhinitis   . Arthritis   . B12 deficiency   . Cervicogenic headache 01/28/2016  . Childhood asthma   . Chronic back pain   . Coronary atherosclerosis of native coronary artery    Mild nonobstructive CAD at catheterization January 2015  . Depression   . Essential hypertension   . Falls   . GERD (gastroesophageal reflux disease)   . History of cerebrovascular disease 07/23/2015  . History of pneumonia 02/2011  . Hyperlipidemia   . Kidney stone   . Memory difficulty 07/23/2015  . OSA (obstructive sleep apnea)    CPAP - Dr. [name]  . Prostate cancer ([place])   . PTSD (post-traumatic stress disorder)    [place]  . Rectal bleeding     Past Surgical History:  Procedure Laterality Date  . APPLICATION OF ROBOTIC ASSISTANCE FOR SPINAL PROCEDURE N/A 03/28/2018   Procedure: APPLICATION OF ROBOTIC ASSISTANCE FOR SPINAL  PROCEDURE;  Surgeon: Kristeen Miss, MD;  Location: [place];  Service: Neurosurgery;  Laterality: N/A;  . BACK SURGERY  02/14/12   lumbar OR #7; "today redid L1L2; replaced screws; added bone from hip"  . BILATERAL KNEE ARTHROSCOPY    . COLONOSCOPY  10/15/2008   Dr. [name]: tubular adenoma   . COLONOSCOPY  12/17/2003   SHF:[id number] rectal and colon  . COLONOSCOPY N/A 09/05/2014   Procedure: COLONOSCOPY;  Surgeon: Daneil Dolin, MD;  [facility];  Service: Endoscopy;  Laterality: N/A;  7:30-rescheduled 9/17 to [place] notified pt  . CYSTOSCOPY WITH STENT PLACEMENT Right 01/27/2016   Procedure: CYSTOSCOPY WITH STENT PLACEMENT;  Surgeon: [name], MD;  [facility];  Service: Urology;  Laterality: Right;  . CYSTOSCOPY/RETROGRADE/URETEROSCOPY/STONE EXTRACTION WITH BASKET Right 01/27/2016   Procedure: CYSTOSCOPY, RIGHT RETROGRADE, RIGHT URETEROSCOPY, STONE EXTRACTION ;  Surgeon: [name], MD;  [facility];  Service: Urology;  Laterality: Right;  . ESOPHAGOGASTRODUODENOSCOPY  10/15/2008     Dr Rourk:Schatzki's ring status post dilation and disruption via 53 F Maloney dilator/ otherwise unremarkable esophagus, small hiatal hernia, multiple fundal gland polyps not manipulated, gastritis, negative H.pylori  . ESOPHAGOGASTRODUODENOSCOPY  06/21/02   HYI:FOYDX sliding hiatal hernia with mild changes of reflux esophagitis limited to gastroesophageal junction.  Noncritical ring at distal esophagus, 3 cm proximal to gastroesophageal junction/Antral gastritis  . [place]   "broke face playing softball"  . FRACTURE SURGERY     "left wrist; broke it; took spur off"  . HOLMIUM LASER APPLICATION Right 12/28/4172   Procedure: HOLMIUM LASER APPLICATION;  Surgeon: [name], MD;  Location[facility];  Service: Urology;  Laterality: Right;  . KNEE ARTHROSCOPY Right 05/18/2018   Procedure: PARTIAL MEDIAL MENISECTOMY AND SURGICAL LAVAGE AND CHONDROPLASTY;   Surgeon: [name], MD;  Location: [place];  Service: Orthopedics;  Laterality: Right;  . LEFT HEART CATHETERIZATION WITH CORONARY ANGIOGRAM N/A 12/27/2013   Procedure: LEFT HEART CATHETERIZATION WITH CORONARY ANGIOGRAM;  Surgeon: [name], MD;  Location: [facility] CATH LAB;  Service: Cardiovascular;  Laterality: N/A;  . neck epidural    . POSTERIOR LUMBAR FUSION 4 LEVEL N/A 03/28/2018   Procedure: Thoracic eight -Lumbar two- FIXATION WITH SCREW PLACEMENT, DECOMPRESSION Thoracic ten-Thoracic eleven  FOR OSTEOMYELITIS;  Surgeon: Kristeen Miss, MD;  Location: [place];  Service: Neurosurgery;  Laterality: N/A;  . SHOULDER SURGERY Bilateral   . TEE WITHOUT CARDIOVERSION N/A 03/21/2018   Procedure: TRANSESOPHAGEAL ECHOCARDIOGRAM (TEE) WITH PROPOFOL;  Surgeon: [name], MD;  [facility];  Service: Cardiovascular;  Laterality: N/A;    There were no vitals filed for this visit.  Subjective Assessment - 08/24/18 1525    Subjective  Patient reported that he isn't having any pain today, but that he just feels tired because he didn't sleep well last night.     Pertinent History  T10-11 Epidural Abscess, back surgery on 03/28/18, right knee partial medial menisectomy and chondroplasty    Patient Stated Goals  To walk    Currently in Pain?  No/denies                       [facility] Adult PT Treatment/Exercise - 08/24/18 0001      Bed Mobility   Bed Mobility  Sit to Supine;Supine to Sit;Rolling Left    Rolling Left  Supervision/Verbal cueing    Supine to Sit  Supervision/Verbal cueing    Sit to Supine  Supervision/Verbal cueing      Knee/Hip Exercises: Standing   Gait Training  22 feet and 30 feet with RW and wheelchair pushed behind for safety      Knee/Hip Exercises: Seated   Long Arc Quad  Both;10 reps    Other Seated Knee/Hip Exercises  heel/toe raises x 10 each    Marching  Both;10 reps;Limitations    Marching Limitations  5" holds alternating on dynadisc    Sit to  Sand  with UE support   x10 with 1 hand on RW     Knee/Hip Exercises: Supine   Bridges  2 sets;10 reps    Straight Leg Raises  Both;10 reps    Other Supine Knee/Hip Exercises  clam with RTB   Manual assistance for form            PT Education - 08/24/18 1526    Education Details  Gait training and exercise technique.     Person(s) Educated  Patient    Methods  Explanation    Comprehension  Verbalized understanding       PT Short Term Goals - 08/24/18 1529      PT SHORT TERM GOAL #1   Title  Patient will report understanding and regular compliance with HEP to improve strength and overall  functional mobility.     Time  4    Period  Weeks    Status  On-going      PT SHORT TERM GOAL #2   Title  Patient will demonstrate improvement of 1/2 MMT strength grade in all muscle groups tested to assist patient with safe transfers, ambulation, and standing.     Time  4    Period  Weeks    Status  On-going      PT SHORT TERM GOAL #3   Title  Patient will report or demonstrate ability to perform ambulation for 2 minutes with LRAD with no more than supervision assistance without rest break and reported no more than minimal difficulty.     Time  4    Period  Weeks    Status  On-going        PT Long Term Goals - 08/24/18 1529      PT LONG TERM GOAL #1   Title  Patient will report or demonstrate ability to perform ambulation for 5 minutes with LRAD with no more than supervision assistance without rest break and reported no more than minimal difficulty.     Time  8    Period  Weeks    Status  On-going      PT LONG TERM GOAL #2   Title  Patient will report ability to stand at a support surface for 10 minutes without a rest break in order to perform household chores with increased ease.     Time  8    Period  Weeks    Status  On-going      PT LONG TERM GOAL #3   Title  Patient will demonstrate an improvement of 10% on FOTO indicating improvement in percieved functional  mobility.     Time  8    Period  Weeks    Status  On-going            Plan - 08/24/18 1528    Clinical Impression Statement  This session continued with progressing patient with functional mobility and lower extremity strengthening exercises. Began session with patient performing seated exercises, then progressed to supine exercises for lower extremity strengthening. Finally performed sit to stands and gait training. Therapist had a wheelchair following patient with ambulation. Patient was able to ambulate 2 bouts for 22 feet and then 30 feet this session. Plan to continue with progression focusing on gait training and functional strengthening.     Rehab Potential  Fair    Clinical Impairments Affecting Rehab Potential  Positive: Highly motivated; Negative: Comorbidities    PT Frequency  3x / week    PT Duration  8 weeks    PT Treatment/Interventions  ADLs/Self Care Home Management;Aquatic Therapy;DME Instruction;Gait training;Functional mobility training;Stair training;Therapeutic activities;Therapeutic exercise;Balance training;Neuromuscular re-education;Patient/family education;Orthotic Fit/Training;Wheelchair mobility training;Manual techniques;Passive range of motion;Energy conservation;Dry needling;Splinting;Taping    PT Next Visit Plan   Progress standing and gait tolerance, monitor patient's blood pressure as needed. Adhere to BLT precautions until follow-up with physician. Work on ambulation, transfers, and functional lower extremity strengthening.     PT Home Exercise Plan  08/16/18:  seated DF/PF, LAQ, marching, abd with theraband and bridge       Patient will benefit from skilled therapeutic intervention in order to improve the following deficits and impairments:  Abnormal gait, Decreased balance, Decreased endurance, Decreased mobility, Difficulty walking, Hypomobility, Decreased range of motion, Decreased activity tolerance, Decreased strength, Impaired flexibility  Visit  Diagnosis: Other abnormalities of gait  and mobility  Other symptoms and signs involving the musculoskeletal system  Muscle weakness (generalized)     Problem List Patient Active Problem List   Diagnosis Date Noted  . Pseudogout of knee   . Pyogenic arthritis of right knee joint ([place])   . Falls frequently 05/08/2018  . Fall   . Sleep disturbance   . Post-op pain   . Hypokalemia   . Morbid obesity ([place])   . Postoperative pain   . DVT, lower extremity, distal, acute, bilateral ([place])   . Acute blood loss anemia   . Anemia of chronic disease   . Essential hypertension   . Bacteremia   . Myelopathy ([place]) 03/30/2018  . Paraplegia ([place])   . Postlaminectomy syndrome, lumbar region   . Epidural abscess   . Abdominal distension   . Encephalopathy   . Weakness of both lower extremities   . Discitis of thoracic region   . Spinal cord stimulator status   . Staphylococcus aureus sepsis ([place]) 03/20/2018  . Bacteremia due to methicillin susceptible Staphylococcus aureus (MSSA) 03/18/2018  . Obesity 03/18/2018  . Nephrolithiasis 03/16/2018  . AKI (acute kidney injury) ([place]) 03/16/2018  . Cognitive impairment 03/16/2018  . Chronic pain syndrome 03/16/2018  . B12 deficiency 01/28/2016  . Memory difficulty 07/23/2015  . History of cerebrovascular disease 07/23/2015  . FH: colon cancer 08/13/2014  . Hx of adenomatous colonic polyps 08/13/2014  . Chronic back pain 05/30/2012  . CAD, NATIVE VESSEL 07/17/2010  . HYPERCHOLESTEROLEMIA 10/31/2009  . SMOKELESS TOBACCO ABUSE 10/31/2009  . Obstructive sleep apnea 10/31/2009  . Essential hypertension, benign 10/31/2009  . GERD 10/31/2009   Clarene Critchley PT, DPT 3:31 PM, 08/24/18 [place], [place], [address] Phone: [phone]   Fax:  [phone]  Name: MEHKI KLUMPP MRN: [id number] Date of Birth: 1946/05/14

## 2018-08-28 ENCOUNTER — Encounter (HOSPITAL_COMMUNITY): Payer: Self-pay

## 2018-08-28 ENCOUNTER — Other Ambulatory Visit: Payer: Self-pay

## 2018-08-28 ENCOUNTER — Telehealth (HOSPITAL_COMMUNITY): Payer: Self-pay | Admitting: Physical Therapy

## 2018-08-28 ENCOUNTER — Ambulatory Visit (HOSPITAL_COMMUNITY): Payer: Medicare Other

## 2018-08-28 DIAGNOSIS — R2689 Other abnormalities of gait and mobility: Secondary | ICD-10-CM

## 2018-08-28 DIAGNOSIS — D485 Neoplasm of uncertain behavior of skin: Secondary | ICD-10-CM | POA: Diagnosis not present

## 2018-08-28 DIAGNOSIS — L298 Other pruritus: Secondary | ICD-10-CM | POA: Diagnosis not present

## 2018-08-28 DIAGNOSIS — R29898 Other symptoms and signs involving the musculoskeletal system: Secondary | ICD-10-CM | POA: Diagnosis not present

## 2018-08-28 DIAGNOSIS — M6281 Muscle weakness (generalized): Secondary | ICD-10-CM

## 2018-08-28 DIAGNOSIS — L820 Inflamed seborrheic keratosis: Secondary | ICD-10-CM | POA: Diagnosis not present

## 2018-08-28 DIAGNOSIS — D225 Melanocytic nevi of trunk: Secondary | ICD-10-CM | POA: Diagnosis not present

## 2018-08-28 NOTE — Therapy (Signed)
[place], [place], [address] Phone: [phone]   Fax:  [phone]  Physical Therapy Treatment  Patient Details  Name: Cody Velez MRN: [id number] Date of Birth: 06/24/1946 Referring Provider: Asencion Noble, MD   Encounter Date: 08/28/2018  PT End of Session - 08/28/18 1706    Visit Number  5    Number of Visits  25    Date for PT Re-Evaluation  10/13/18   Minireassess 09/12/18   Authorization Type  Primary: Medicare; Secondary: BCBS (75 visits per calendar year with 14 used at evaluation)    Authorization Time Period  08/15/18 - 10/14/18    Authorization - Visit Number  88    Authorization - Number of Visits  74    PT Start Time  0902    PT Stop Time  0945    PT Time Calculation (min)  43 min    Equipment Utilized During Treatment  Gait belt    Activity Tolerance  Patient tolerated treatment well    Behavior During Therapy  WFL for tasks assessed/performed       Past Medical History:  Diagnosis Date  . Allergic rhinitis   . Arthritis   . B12 deficiency   . Cervicogenic headache 01/28/2016  . Childhood asthma   . Chronic back pain   . Coronary atherosclerosis of native coronary artery    Mild nonobstructive CAD at catheterization January 2015  . Depression   . Essential hypertension   . Falls   . GERD (gastroesophageal reflux disease)   . History of cerebrovascular disease 07/23/2015  . History of pneumonia 02/2011  . Hyperlipidemia   . Kidney stone   . Memory difficulty 07/23/2015  . OSA (obstructive sleep apnea)    CPAP - Dr. [name]  . Prostate cancer ([place])   . PTSD (post-traumatic stress disorder)    [place]  . Rectal bleeding     Past Surgical History:  Procedure Laterality Date  . APPLICATION OF ROBOTIC ASSISTANCE FOR SPINAL PROCEDURE N/A 03/28/2018   Procedure: APPLICATION OF ROBOTIC ASSISTANCE FOR SPINAL PROCEDURE;  Surgeon: Kristeen Miss, MD;  Location: [place];  Service: Neurosurgery;   Laterality: N/A;  . BACK SURGERY  02/14/12   lumbar OR #7; "today redid L1L2; replaced screws; added bone from hip"  . BILATERAL KNEE ARTHROSCOPY    . COLONOSCOPY  10/15/2008   Dr. [name]: tubular adenoma   . COLONOSCOPY  12/17/2003   PPJ:[id number] rectal and colon  . COLONOSCOPY N/A 09/05/2014   Procedure: COLONOSCOPY;  Surgeon: Daneil Dolin, MD;  [facility];  Service: Endoscopy;  Laterality: N/A;  7:30-rescheduled 9/17 to [place] notified pt  . CYSTOSCOPY WITH STENT PLACEMENT Right 01/27/2016   Procedure: CYSTOSCOPY WITH STENT PLACEMENT;  Surgeon: [name], MD;  [facility];  Service: Urology;  Laterality: Right;  . CYSTOSCOPY/RETROGRADE/URETEROSCOPY/STONE EXTRACTION WITH BASKET Right 01/27/2016   Procedure: CYSTOSCOPY, RIGHT RETROGRADE, RIGHT URETEROSCOPY, STONE EXTRACTION ;  Surgeon: [name], MD;  Location: [facility];  Service: Urology;  Laterality: Right;  . ESOPHAGOGASTRODUODENOSCOPY  10/15/2008     Dr Rourk:Schatzki's ring status post dilation and disruption via 40 F Maloney dilator/ otherwise unremarkable esophagus, small hiatal hernia, multiple fundal gland polyps not manipulated, gastritis, negative H.pylori  . ESOPHAGOGASTRODUODENOSCOPY  06/21/02   TIW:PYKDX sliding hiatal hernia with mild changes of reflux esophagitis limited to gastroesophageal junction.  Noncritical ring at distal esophagus, 3 cm proximal to gastroesophageal junction/Antral gastritis  .  [place]   "broke face playing softball"  . FRACTURE SURGERY     "left wrist; broke it; took spur off"  . HOLMIUM LASER APPLICATION Right 12/27/5629   Procedure: HOLMIUM LASER APPLICATION;  Surgeon: [name], MD;  Location: [facility];  Service: Urology;  Laterality: Right;  . KNEE ARTHROSCOPY Right 05/18/2018   Procedure: PARTIAL MEDIAL MENISECTOMY AND SURGICAL LAVAGE AND CHONDROPLASTY;  Surgeon: [name], MD;  Location: [place];  Service: Orthopedics;  Laterality:  Right;  . LEFT HEART CATHETERIZATION WITH CORONARY ANGIOGRAM N/A 12/27/2013   Procedure: LEFT HEART CATHETERIZATION WITH CORONARY ANGIOGRAM;  Surgeon: [name], MD;  Location: [facility] CATH LAB;  Service: Cardiovascular;  Laterality: N/A;  . neck epidural    . POSTERIOR LUMBAR FUSION 4 LEVEL N/A 03/28/2018   Procedure: Thoracic eight -Lumbar two- FIXATION WITH SCREW PLACEMENT, DECOMPRESSION Thoracic ten-Thoracic eleven  FOR OSTEOMYELITIS;  Surgeon: Kristeen Miss, MD;  Location: [place];  Service: Neurosurgery;  Laterality: N/A;  . SHOULDER SURGERY Bilateral   . TEE WITHOUT CARDIOVERSION N/A 03/21/2018   Procedure: TRANSESOPHAGEAL ECHOCARDIOGRAM (TEE) WITH PROPOFOL;  Surgeon: [name], MD;  [facility];  Service: Cardiovascular;  Laterality: N/A;    There were no vitals filed for this visit.  Subjective Assessment - 08/28/18 0911    Subjective  Patient reports he slept better the past couple nights and denies pain at start of session. He states he has been searching for a place to do aquatic therapy and would like to try that here since we offer it.    Pertinent History  T10-11 Epidural Abscess, back surgery on 03/28/18, right knee partial medial menisectomy and chondroplasty    How long can you sit comfortably?  Not limited    How long can you stand comfortably?  5 minutes holding onto something (90 seconds without)    How long can you walk comfortably?  With walker 0 minutes    Diagnostic tests  CT Lumbar Spine 05/12/18: "Spinal stimulator device, along with multilevel multisegment fusion hardware appear appropriately placed without adverse features."    Patient Stated Goals  To walk    Currently in Pain?  No/denies         [facility] Adult PT Treatment/Exercise - 08/28/18 0001      Ambulation/Gait   Ambulation/Gait  Yes    Ambulation/Gait Assistance  3: Mod assist    Ambulation/Gait Assistance Details  manual cues for posture and faciliation of weight shift for stepping, assist  to manage RW safely, wheelchair follow for safety    Ambulation Distance (Feet)  50 Feet   2 bouts ~ 20', and 30'   Assistive device  Rolling walker    Gait Pattern  Decreased stride length;Decreased dorsiflexion - right;Decreased dorsiflexion - left;Trunk flexed;Step-to pattern;Decreased step length - right;Decreased step length - left;Decreased hip/knee flexion - right;Decreased hip/knee flexion - left    Ambulation Surface  Level;Indoor    Gait velocity  slow      Exercises   Exercises  Knee/Hip      Knee/Hip Exercises: Standing   Other Standing Knee Exercises  Step taps in // bars, 4" box, 2x 10 reps bil LE (2nd set: manual resistance to improve proprioception)      Knee/Hip Exercises: Seated   Sit to Sand  2 sets;10 reps;with UE support      Knee/Hip Exercises: Supine   Bridges  2 sets;10 reps;Limitations    Bridges Limitations  red theraband around  Straight Leg Raises  --      Knee/Hip Exercises: Sidelying   Clams  2x 10 reps bil LE; with red theraband        PT Education - 08/28/18 1708    Education Details  Gait training and exercise purpose throughout. Educated about aquatic therapy on Mondays and will adjust schedule to make appointments for him    Person(s) Educated  Patient    Methods  Explanation    Comprehension  Verbalized understanding       PT Short Term Goals - 08/24/18 1529      PT SHORT TERM GOAL #1   Title  Patient will report understanding and regular compliance with HEP to improve strength and overall functional mobility.     Time  4    Period  Weeks    Status  On-going      PT SHORT TERM GOAL #2   Title  Patient will demonstrate improvement of 1/2 MMT strength grade in all muscle groups tested to assist patient with safe transfers, ambulation, and standing.     Time  4    Period  Weeks    Status  On-going      PT SHORT TERM GOAL #3   Title  Patient will report or demonstrate ability to perform ambulation for 2 minutes with LRAD with no  more than supervision assistance without rest break and reported no more than minimal difficulty.     Time  4    Period  Weeks    Status  On-going        PT Long Term Goals - 08/24/18 1529      PT LONG TERM GOAL #1   Title  Patient will report or demonstrate ability to perform ambulation for 5 minutes with LRAD with no more than supervision assistance without rest break and reported no more than minimal difficulty.     Time  8    Period  Weeks    Status  On-going      PT LONG TERM GOAL #2   Title  Patient will report ability to stand at a support surface for 10 minutes without a rest break in order to perform household chores with increased ease.     Time  8    Period  Weeks    Status  On-going      PT LONG TERM GOAL #3   Title  Patient will demonstrate an improvement of 10% on FOTO indicating improvement in percieved functional mobility.     Time  8    Period  Weeks    Status  On-going         Plan - 08/28/18 1707    Clinical Impression Statement  Patient continued with hip strengthening and gait training this session. He continues to require Mod Assist for gait with RW and wheelchair follow for safety as his legs buckle when fatigued. Step taps were initiated today for hip flexor strengthening and coordination training. He had difficulty with proprioception and sequencing single tap at edge of box. Manual resistance at ankles on bil LE for forward and backwards stepping improved patients awareness and foot placement. He will continue to benefit from skilled PT interventions to address impairments and would benefit from land and aquatic based exercises as well as BWSTT.     Rehab Potential  Fair    Clinical Impairments Affecting Rehab Potential  Positive: Highly motivated; Negative: Comorbidities    PT Frequency  3x / week  PT Duration  8 weeks    PT Treatment/Interventions  ADLs/Self Care Home Management;Aquatic Therapy;DME Instruction;Gait training;Functional mobility  training;Stair training;Therapeutic activities;Therapeutic exercise;Balance training;Neuromuscular re-education;Patient/family education;Orthotic Fit/Training;Wheelchair mobility training;Manual techniques;Passive range of motion;Energy conservation;Dry needling;Splinting;Taping    PT Next Visit Plan  Progress standing and gait tolerance, monitor patient's blood pressure as needed. Adhere to BLT precautions until follow-up with physician. Work on ambulation, transfers, and functional lower extremity strengthening. Introduce BWSTT and adjust schedule to find aquatic therapy spot for him.    PT Home Exercise Plan  08/16/18:  seated DF/PF, LAQ, marching, abd with theraband and bridge    Consulted and Agree with Plan of Care  Patient       Patient will benefit from skilled therapeutic intervention in order to improve the following deficits and impairments:  Abnormal gait, Decreased balance, Decreased endurance, Decreased mobility, Difficulty walking, Hypomobility, Decreased range of motion, Decreased activity tolerance, Decreased strength, Impaired flexibility  Visit Diagnosis: Other abnormalities of gait and mobility  Other symptoms and signs involving the musculoskeletal system  Muscle weakness (generalized)     Problem List Patient Active Problem List   Diagnosis Date Noted  . Pseudogout of knee   . Pyogenic arthritis of right knee joint ([place])   . Falls frequently 05/08/2018  . Fall   . Sleep disturbance   . Post-op pain   . Hypokalemia   . Morbid obesity ([place])   . Postoperative pain   . DVT, lower extremity, distal, acute, bilateral ([place])   . Acute blood loss anemia   . Anemia of chronic disease   . Essential hypertension   . Bacteremia   . Myelopathy ([place]) 03/30/2018  . Paraplegia ([place])   . Postlaminectomy syndrome, lumbar region   . Epidural abscess   . Abdominal distension   . Encephalopathy   . Weakness of both lower extremities   . Discitis of thoracic region   .  Spinal cord stimulator status   . Staphylococcus aureus sepsis ([place]) 03/20/2018  . Bacteremia due to methicillin susceptible Staphylococcus aureus (MSSA) 03/18/2018  . Obesity 03/18/2018  . Nephrolithiasis 03/16/2018  . AKI (acute kidney injury) ([place]) 03/16/2018  . Cognitive impairment 03/16/2018  . Chronic pain syndrome 03/16/2018  . B12 deficiency 01/28/2016  . Memory difficulty 07/23/2015  . History of cerebrovascular disease 07/23/2015  . FH: colon cancer 08/13/2014  . Hx of adenomatous colonic polyps 08/13/2014  . Chronic back pain 05/30/2012  . CAD, NATIVE VESSEL 07/17/2010  . HYPERCHOLESTEROLEMIA 10/31/2009  . SMOKELESS TOBACCO ABUSE 10/31/2009  . Obstructive sleep apnea 10/31/2009  . Essential hypertension, benign 10/31/2009  . GERD 10/31/2009    Kipp Brood, PT, DPT Physical Therapist with [facility]  08/28/2018 5:09 PM    [place] [address] [place], [place], [address] Phone: [phone]   Fax:  [phone]  Name: Cody Velez MRN: [id number] Date of Birth: 10/31/1946

## 2018-08-28 NOTE — Telephone Encounter (Signed)
Patient will be out of town for a week

## 2018-08-30 ENCOUNTER — Encounter (HOSPITAL_COMMUNITY): Payer: Self-pay

## 2018-08-30 ENCOUNTER — Ambulatory Visit (INDEPENDENT_AMBULATORY_CARE_PROVIDER_SITE_OTHER): Payer: Medicare Other | Admitting: Internal Medicine

## 2018-08-30 ENCOUNTER — Other Ambulatory Visit: Payer: Self-pay

## 2018-08-30 ENCOUNTER — Encounter: Payer: Self-pay | Admitting: Internal Medicine

## 2018-08-30 ENCOUNTER — Ambulatory Visit (HOSPITAL_COMMUNITY): Payer: Medicare Other

## 2018-08-30 DIAGNOSIS — I2510 Atherosclerotic heart disease of native coronary artery without angina pectoris: Secondary | ICD-10-CM | POA: Diagnosis not present

## 2018-08-30 DIAGNOSIS — M6281 Muscle weakness (generalized): Secondary | ICD-10-CM | POA: Diagnosis not present

## 2018-08-30 DIAGNOSIS — R29898 Other symptoms and signs involving the musculoskeletal system: Secondary | ICD-10-CM | POA: Diagnosis not present

## 2018-08-30 DIAGNOSIS — R2689 Other abnormalities of gait and mobility: Secondary | ICD-10-CM

## 2018-08-30 DIAGNOSIS — M4644 Discitis, unspecified, thoracic region: Secondary | ICD-10-CM | POA: Diagnosis not present

## 2018-08-30 NOTE — Assessment & Plan Note (Signed)
Cody Velez is improving slowly on therapy for MSSA bacteremia complicated by thoracic spine infection.  It is quite possible that his infection has been cured through a combination of surgery and a long course of antibiotics but the only proof of cure would be to stop all antibiotics and wait and see what happens.  I discussed management options with him including continuing his current 2 drug regimen, continuing cephalexin alone or stopping all antibiotics soon.  He favors continuing his 2 drug regimen for now.  I will repeat his inflammatory markers today and see him back in 6 weeks.

## 2018-08-30 NOTE — Progress Notes (Signed)
[facility] for Infectious Disease  Patient Active Problem List   Diagnosis Date Noted  . Pseudogout of knee     Priority: High  . Discitis of thoracic region     Priority: High  . Bacteremia due to methicillin susceptible Staphylococcus aureus (MSSA) 03/18/2018    Priority: High  . Pyogenic arthritis of right knee joint ([place])   . Falls frequently 05/08/2018  . Fall   . Sleep disturbance   . Post-op pain   . Hypokalemia   . Morbid obesity ([place])   . Postoperative pain   . DVT, lower extremity, distal, acute, bilateral ([place])   . Acute blood loss anemia   . Anemia of chronic disease   . Essential hypertension   . Bacteremia   . Myelopathy ([place]) 03/30/2018  . Paraplegia ([place])   . Postlaminectomy syndrome, lumbar region   . Epidural abscess   . Abdominal distension   . Encephalopathy   . Weakness of both lower extremities   . Spinal cord stimulator status   . Staphylococcus aureus sepsis ([place]) 03/20/2018  . Obesity 03/18/2018  . Nephrolithiasis 03/16/2018  . AKI (acute kidney injury) ([place]) 03/16/2018  . Cognitive impairment 03/16/2018  . Chronic pain syndrome 03/16/2018  . B12 deficiency 01/28/2016  . Memory difficulty 07/23/2015  . History of cerebrovascular disease 07/23/2015  . FH: colon cancer 08/13/2014  . Hx of adenomatous colonic polyps 08/13/2014  . Chronic back pain 05/30/2012  . CAD, NATIVE VESSEL 07/17/2010  . HYPERCHOLESTEROLEMIA 10/31/2009  . SMOKELESS TOBACCO ABUSE 10/31/2009  . Obstructive sleep apnea 10/31/2009  . Essential hypertension, benign 10/31/2009  . GERD 10/31/2009    Patient's Medications  New Prescriptions   No medications on file  Previous Medications   AMLODIPINE (NORVASC) 10 MG TABLET    Take 1 tablet (10 mg total) by mouth daily.   ASPIRIN 81 MG TABLET    Take 1 tablet (81 mg total) by mouth daily.   BACLOFEN (LIORESAL) 10 MG TABLET    TAKE ONE TABLET IN THE MORNING AND MIDDAY, THEN TWO TABLETS AT NIGHT   BACLOFEN 5  MG TABS    Take 5 mg by mouth 3 (three) times daily.   CEPHALEXIN (KEFLEX) 500 MG CAPSULE    Take 1 capsule (500 mg total) by mouth 3 (three) times daily.   COLCHICINE 0.6 MG TABLET    Take 1 tablet (0.6 mg total) by mouth daily.   CYCLOSPORINE (RESTASIS) 0.05 % OPHTHALMIC EMULSION    1 DROP INTO BOTH EYES TWICE A DAY FOR DRY EYES   DALTEPARIN SODIUM (FRAGMIN) 40981 UNT/0.72ML SOLN    Inject 0.72 mLs into the skin.   DIAZEPAM (VALIUM) 5 MG TABLET    Take 1 tablet (5 mg total) by mouth every 6 (six) hours as needed for muscle spasms.   DOCUSATE SODIUM (COLACE) 100 MG CAPSULE    Take 2 capsules (200 mg total) by mouth 2 (two) times daily.   DONEPEZIL (ARICEPT) 10 MG TABLET    Take 1 tablet (10 mg total) by mouth at bedtime.   FINASTERIDE (PROSCAR) 5 MG TABLET    Take 1 tablet (5 mg total) by mouth daily. Reported on 05/04/2016   FLUTICASONE (FLONASE) 50 MCG/ACT NASAL SPRAY    Place 1 spray into both nostrils daily as needed for allergies.   GABAPENTIN (NEURONTIN) 400 MG CAPSULE    Take 1 capsule (400 mg total) by mouth 3 (three) times daily.  LATANOPROST (XALATAN) 0.005 % OPHTHALMIC SOLUTION    Place 1 drop into both eyes at bedtime.   LOSARTAN (COZAAR) 100 MG TABLET    Take 100 mg by mouth daily.   MELATONIN 3 MG TABS    Take 0.5 tablets (1.5 mg total) by mouth at bedtime.   MEMANTINE (NAMENDA) 10 MG TABLET    Take 1 tablet (10 mg total) by mouth 2 (two) times daily.   OMEPRAZOLE (PRILOSEC) 20 MG CAPSULE    Take 2 capsules (40 mg total) by mouth daily.   OXYCODONE HCL 10 MG TABS    Take 1 tablet (10 mg total) by mouth every 4 (four) hours as needed (moderate to severe pain).   POLYETHYLENE GLYCOL (MIRALAX / GLYCOLAX) PACKET    Take 17 g by mouth daily.   POTASSIUM CHLORIDE SA (K-DUR,KLOR-CON) 20 MEQ TABLET    Take 1 tablet (20 mEq total) by mouth daily.   PRAVASTATIN (PRAVACHOL) 10 MG TABLET    Take 1 tablet (10 mg total) by mouth every evening.   RIFAMPIN (RIFADIN) 300 MG CAPSULE    Take 1  capsule (300 mg total) by mouth every 12 (twelve) hours. Continue through 06/18/2018 and then to be determined by ID clinic   TRAZODONE (DESYREL) 100 MG TABLET    Take 1 tablet (100 mg total) by mouth at bedtime.   VENLAFAXINE XR (EFFEXOR-XR) 150 MG 24 HR CAPSULE    Take 1 capsule (150 mg total) by mouth 2 (two) times daily.   VITAMIN B-12 (CYANOCOBALAMIN) 1000 MCG TABLET    Take 1 tablet (1,000 mcg total) by mouth daily.  Modified Medications   No medications on file  Discontinued Medications   No medications on file    Subjective: Cody Velez is in for his routine follow-up visit.  He is accompanied by his wife.  In late March he developed MSSA bacteremia complicated by thoracic discitis and lower extremity weakness.  He underwent incision and drainage and thoracolumbar fusion by Dr. Ellene Route.  He completed a long course of IV antibiotics and then converted to oral cephalexin and rifampin.  He has now completed 5-1/2 months of therapy.  He denies any problems tolerating his antibiotics.  His back pain is much improved and he is not requiring prescription pain medication.  He is making slow progress with physical therapy.  He and his wife have been wondering if his back infection was triggered by the nerve stimulator that was placed last January.  Review of Systems: Review of Systems  Constitutional: Negative for chills, diaphoresis and fever.  Gastrointestinal: Negative for abdominal pain, diarrhea, nausea and vomiting.  Musculoskeletal: Positive for back pain.  Neurological: Positive for sensory change and focal weakness.    Past Medical History:  Diagnosis Date  . Allergic rhinitis   . Arthritis   . B12 deficiency   . Cervicogenic headache 01/28/2016  . Childhood asthma   . Chronic back pain   . Coronary atherosclerosis of native coronary artery    Mild nonobstructive CAD at catheterization January 2015  . Depression   . Essential hypertension   . Falls   . GERD (gastroesophageal  reflux disease)   . History of cerebrovascular disease 07/23/2015  . History of pneumonia 02/2011  . Hyperlipidemia   . Kidney stone   . Memory difficulty 07/23/2015  . OSA (obstructive sleep apnea)    CPAP - Dr. [name]  . Prostate cancer ([place])   . PTSD (post-traumatic stress disorder)    [place]  .  Rectal bleeding     Social History   Tobacco Use  . Smoking status: Former Smoker    Types: Cigarettes    Last attempt to quit: 12/20/1958    Years since quitting: 59.7  . Smokeless tobacco: Former Systems developer    Types: Chew  . Tobacco comment: Quit chewing 04/2015  Substance Use Topics  . Alcohol use: Yes    Alcohol/week: 0.0 standard drinks    Comment: couple bottles of wine per month  . Drug use: No    Types: Marijuana    Comment: "last used marijuana ~ 1969"    Family History  Problem Relation Age of Onset  . Emphysema Father   . Heart failure Father   . Lung cancer Father   . CAD Father   . Colon cancer Mother   . Stroke Mother   . Breast cancer Mother   . Stroke Sister   . Heart attack Brother   . Dementia Paternal Uncle   . Emphysema Maternal Grandmother   . Stroke Maternal Grandmother   . Asthma Other        grandson  . Heart disease Paternal Grandfather   . Anesthesia problems Neg Hx   . Hypotension Neg Hx   . Malignant hyperthermia Neg Hx   . Pseudochol deficiency Neg Hx     No Known Allergies  Objective: Vitals:   08/30/18 1100  BP: 129/76  Pulse: 70  Temp: 98.2 F (36.8 C)  TempSrc: Oral   There is no height or weight on file to calculate BMI.  Physical Exam  Constitutional: He is oriented to person, place, and time.  He is in very good spirits as usual.  He is seated in his wheelchair.  Abdominal: Soft. He exhibits no distension. There is no tenderness.  Musculoskeletal:  His back incision is well-healed.  His nerve stimulator easily palpable over his right mid back.  There are no signs of inflammation or infection.  Neurological: He is alert and  oriented to person, place, and time.  Skin: No rash noted.    Lab Results Sed Rate  Date Value  07/18/2018 33 mm/h (H)  06/06/2018 72 mm/h (H)  05/11/2018 125 mm/hr (H)   CRP  Date Value  07/18/2018 7.0 mg/L  06/06/2018 19.5 mg/L (H)  05/11/2018 22.0 mg/dL (H)     Problem List Items Addressed This Visit      High   Discitis of thoracic region    Cody Velez is improving slowly on therapy for MSSA bacteremia complicated by thoracic spine infection.  It is quite possible that his infection has been cured through a combination of surgery and a long course of antibiotics but the only proof of cure would be to stop all antibiotics and wait and see what happens.  I discussed management options with him including continuing his current 2 drug regimen, continuing cephalexin alone or stopping all antibiotics soon.  He favors continuing his 2 drug regimen for now.  I will repeat his inflammatory markers today and see him back in 6 weeks.      Relevant Orders   C-reactive protein   Sedimentation rate       Michel Bickers, MD [facility] for [facility]ious [employer] [phone] pager   [phone] cell 08/30/2018, 11:50 AM

## 2018-08-30 NOTE — Therapy (Signed)
[facility] [place], [place], [address] Phone: [phone]   Fax:  [phone]  Physical Therapy Treatment  Patient Details  Name: Cody Velez MRN: [id number] Date of Birth: [birth year] Referring Provider: Asencion Noble, MD   Encounter Date: 08/30/2018  PT End of Session - 08/30/18 1724    Visit Number  6    Number of Visits  25    Date for PT Re-Evaluation  10/13/18   Min[facility]assess 09/12/18   Authorization Type  Primary: Medicare; Secondary: BCBS (75 visits per calendar year with 14 used at evaluation)    Authorization Time Period  08/15/18 - 10/14/18    Authorization - Visit Number  44    Authorization - Number of Visits  67    PT Start Time  1301    PT Stop Time  1349    PT Time Calculation (min)  48 min    Equipment Utilized During Treatment  Gait belt    Activity Tolerance  Patient tolerated treatment well    Behavior During Therapy  WFL for tasks assessed/performed       Past Medical History:  Diagnosis Date  . Allergic rhinitis   . Arthritis   . B12 deficiency   . Cervicogenic headache 01/28/2016  . Childhood asthma   . Chronic back pain   . Coronary atherosclerosis of native coronary artery    Mild nonobstructive CAD at catheterization January 2015  . Depression   . Essential hypertension   . Falls   . GERD (gastroesophageal reflux disease)   . History of cerebrovascular disease 07/23/2015  . History of pneumonia 02/2011  . Hyperlipidemia   . Kidney stone   . Memory difficulty 07/23/2015  . OSA (obstructive sleep apnea)    CPAP - Dr. [name]  . Prostate cancer ([place])   . PTSD (post-traumatic stress disorder)    [place]  . Rectal bleeding     Past Surgical History:  Procedure Laterality Date  . APPLICATION OF ROBOTIC ASSISTANCE FOR SPINAL PROCEDURE N/A 03/28/2018   Procedure: APPLICATION OF ROBOTIC ASSISTANCE FOR SPINAL PROCEDURE;  Surgeon: Kristeen Miss, MD;  Location: [facility];  Service: Neurosurgery;   Laterality: N/A;  . BACK SURGERY  02/14/12   lumbar OR #7; "today redid L1L2; replaced screws; added bone from hip"  . BILATERAL KNEE ARTHROSCOPY    . COLONOSCOPY  10/15/2008   Dr. [name]: tubular adenoma   . COLONOSCOPY  12/17/2003   PIR:JJOACZ rectal and colon  . COLONOSCOPY N/A 09/05/2014   Procedure: COLONOSCOPY;  Surgeon: Daneil Dolin, MD;  [facility];  Service: Endoscopy;  Laterality: N/A;  7:30-rescheduled 9/17 to [place] notified pt  . CYSTOSCOPY WITH STENT PLACEMENT Right 01/27/2016   Procedure: CYSTOSCOPY WITH STENT PLACEMENT;  Surgeon: [name], MD;  Location: [facility];  Service: Urology;  Laterality: Right;  . CYSTOSCOPY/RETROGRADE/URETEROSCOPY/STONE EXTRACTION WITH BASKET Right 01/27/2016   Procedure: CYSTOSCOPY, RIGHT RETROGRADE, RIGHT URETEROSCOPY, STONE EXTRACTION ;  Surgeon: [name], MD;  Location: [facility];  Service: Urology;  Laterality: Right;  . ESOPHAGOGASTRODUODENOSCOPY  10/15/2008     Dr Rourk:Schatzki's ring status post dilation and disruption via 60 F Maloney dilator/ otherwise unremarkable esophagus, small hiatal hernia, multiple fundal gland polyps not manipulated, gastritis, negative H.pylori  . ESOPHAGOGASTRODUODENOSCOPY  06/21/02   YSA:YTKZS sliding hiatal hernia with mild changes of reflux esophagitis limited to gastroesophageal junction.  Noncritical ring at distal esophagus, 3 cm proximal to gastroesophageal junction/Antral gastritis  .  [place]   "broke face playing softball"  . FRACTURE SURGERY     "left wrist; broke it; took spur off"  . HOLMIUM LASER APPLICATION Right 05/25/629   Procedure: HOLMIUM LASER APPLICATION;  Surgeon: [name], MD;  Location: [facility];  Service: Urology;  Laterality: Right;  . KNEE ARTHROSCOPY Right 05/18/2018   Procedure: PARTIAL MEDIAL MENISECTOMY AND SURGICAL LAVAGE AND CHONDROPLASTY;  Surgeon: [name], MD;  Location: [facility];  Service: Orthopedics;  Laterality:  Right;  . LEFT HEART CATHETERIZATION WITH CORONARY ANGIOGRAM N/A 12/27/2013   Procedure: LEFT HEART CATHETERIZATION WITH CORONARY ANGIOGRAM;  Surgeon: [name], MD;  Location: [facility] C[facility];  Service: Cardiovascular;  Laterality: N/A;  . neck epidural    . POSTERIOR LUMBAR FUSION 4 LEVEL N/A 03/28/2018   Procedure: Thoracic eight -Lumbar two- FIXATION WITH SCREW PLACEMENT, DECOMPRESSION Thoracic ten-Thoracic eleven  FOR OSTEOMYELITIS;  Surgeon: Kristeen Miss, MD;  Location: [facility];  Service: Neurosurgery;  Laterality: N/A;  . SHOULDER SURGERY Bilateral   . TEE WITHOUT CARDIOVERSION N/A 03/21/2018   Procedure: TRANSESOPHAGEAL ECHOCARDIOGRAM (TEE) WITH PROPOFOL;  Surgeon: [name], MD;  [facility];  Service: Cardiovascular;  Laterality: N/A;    There were no vitals filed for this visit.  Subjective Assessment - 08/30/18 1714    Subjective  Patient reports he is eager to start aquatic therapy and is excited when therapist tells him he can start next week.     Pertinent History  T10-11 Epidural Abscess, back surgery on 03/28/18, right knee partial medial menisectomy and chondroplasty    How long can you sit comfortably?  Not limited    How long can you stand comfortably?  5 minutes holding onto something (90 seconds without)    How long can you walk comfortably?  With walker 0 minutes    Diagnostic tests  CT Lumbar Spine 05/12/18: "Spinal stimulator device, along with multilevel multisegment fusion hardware appear appropriately placed without adverse features."    Patient Stated Goals  To walk    Currently in Pain?  No/denies        [facility] Adult PT Treatment/Exercise - 08/30/18 0001      Ambulation/Gait   Ambulation/Gait  Yes    Ambulation/Gait Assistance  3: Mod assist   BWS system: unweighted ~ 50%   Ambulation/Gait Assistance Details  Manual facilitation of foot placement by PTx2 for Rt/Lt LE along track without UE assist ~ 10 feet, and wthtin //bars RT.     Ambulation  Distance (Feet)  20 Feet    Assistive device  --   // bars   Gait Pattern  Decreased stride length;Decreased dorsiflexion - right;Decreased dorsiflexion - left;Trunk flexed;Step-to pattern;Decreased step length - right;Decreased step length - left;Decreased hip/knee flexion - right;Decreased hip/knee flexion - left    Ambulation Surface  Level;Indoor    Gait velocity  slow      Neuro Re-ed    Neuro Re-ed Details   BWSTT: performed all exercises this session in body weight supported harness system for overground gait and stair training      Exercises   Exercises  Knee/Hip      Knee/Hip Exercises: Standing   Forward Step Up  Both;2 sets;5 reps;10 reps;Step Height: 4";Hand Hold: 2    Forward Step Up Limitations  body weight support harness system: manual facilitation of foot placement and tactile cue at quadriceps tendon for knee extension in standing when step up complete    Other  Standing Knee Exercises  Step taps in // bars, 4" box, 1x 10 reps bil LE (manual resistance to improve proprioception)   With BWS harness system        PT Education - 08/30/18 1722    Education Details  Edcuated on [employer] gait training system for exercises this session. Educated on aquatic therapy and ability begin aquatics this coming Monday and provided instructions on how to check in and get to the pool facility. Edcuated on pool rules and provded handout for aquatic therapy expectations.    Person(s) Educated  Patient    Methods  Explanation;Handout    Comprehension  Verbalized understanding       PT Short Term Goals - 08/24/18 1529      PT SHORT TERM GOAL #1   Title  Patient will report understanding and regular compliance with HEP to improve strength and overall functional mobility.     Time  4    Period  Weeks    Status  On-going      PT SHORT TERM GOAL #2   Title  Patient will demonstrate improvement of 1/2 MMT strength grade in all muscle groups tested to assist patient with safe transfers,  ambulation, and standing.     Time  4    Period  Weeks    Status  On-going      PT SHORT TERM GOAL #3   Title  Patient will report or demonstrate ability to perform ambulation for 2 minutes with LRAD with no more than supervision assistance without rest break and reported no more than minimal difficulty.     Time  4    Period  Weeks    Status  On-going        PT Long Term Goals - 08/24/18 1529      PT LONG TERM GOAL #1   Title  Patient will report or demonstrate ability to perform ambulation for 5 minutes with LRAD with no more than supervision assistance without rest break and reported no more than minimal difficulty.     Time  8    Period  Weeks    Status  On-going      PT LONG TERM GOAL #2   Title  Patient will report ability to stand at a support surface for 10 minutes without a rest break in order to perform household chores with increased ease.     Time  8    Period  Weeks    Status  On-going      PT LONG TERM GOAL #3   Title  Patient will demonstrate an improvement of 10% on FOTO indicating improvement in percieved functional mobility.     Time  8    Period  Weeks    Status  On-going         Plan - 08/30/18 1724    Clinical Impression Statement  Patient initiated BWS gait training and functional strengthening today. Approximately 16 minutes were spent donning/doffing harness system and adjusting fit for patient. Remainder of session focused on gait training with UE support in // bars and manual facilitation for foot placement of bil LE form PT (x2). He also performed stair taps with resistance for coordination training and full step ups with 50-75% BWS onto 4" step. Tactile cues were provided at quadriceps tendon to facilitate knee extension in standing. Patient required intermittent rest breaks and bil knees buckled multiple times when he became fatigued. Patient denied nausea, headache, lightheadedness, dizziness, and other signs/symptoms  of autonomic dysreflexia but  did become mildly diaphoretic during therapy. He will continue to benefit from skilled PT interventions to address impairments and would benefit from land and aquatic based exercises as well as BWSTT.     Rehab Potential  Fair    Clinical Impairments Affecting Rehab Potential  Positive: Highly motivated; Negative: Comorbidities    PT Frequency  3x / week    PT Duration  8 weeks    PT Treatment/Interventions  ADLs/Self Care Home Management;Aquatic Therapy;DME Instruction;Gait training;Functional mobility training;Stair training;Therapeutic activities;Therapeutic exercise;Balance training;Neuromuscular re-education;Patient/family education;Orthotic Fit/Training;Wheelchair mobility training;Manual techniques;Passive range of motion;Energy conservation;Dry needling;Splinting;Taping    PT Next Visit Plan  Progress standing and gait tolerance, monitor patient's blood pressure as needed. Adhere to BLT precautions until follow-up with physician. Continue BWSTT/over ground gait training. Work on ambulation, transfers, and functional lower extremity strengthening. Initiate aquatic therapy.    PT Home Exercise Plan  08/16/18:  seated DF/PF, LAQ, marching, abd with theraband and bridge    Consulted and Agree with Plan of Care  Patient       Patient will benefit from skilled therapeutic intervention in order to improve the following deficits and impairments:  Abnormal gait, Decreased balance, Decreased endurance, Decreased mobility, Difficulty walking, Hypomobility, Decreased range of motion, Decreased activity tolerance, Decreased strength, Impaired flexibility  Visit Diagnosis: Other abnormalities of gait and mobility  Other symptoms and signs involving the musculoskeletal system  Muscle weakness (generalized)     Problem List Patient Active Problem List   Diagnosis Date Noted  . Pseudogout of knee   . Pyogenic arthritis of right knee joint ([place])   . Falls frequently 05/08/2018  . Fall   . Sleep  disturbance   . Post-op pain   . Hypokalemia   . Morbid obesity ([place])   . Postoperative pain   . DVT, lower extremity, distal, acute, bilateral ([place])   . Acute blood loss anemia   . Anemia of chronic disease   . Essential hypertension   . Bacteremia   . Myelopathy ([place]) 03/30/2018  . Paraplegia ([place])   . Postlaminectomy syndrome, lumbar region   . Epidural abscess   . Abdominal distension   . Encephalopathy   . Weakness of both lower extremities   . Discitis of thoracic region   . Spinal cord stimulator status   . Staphylococcus aureus sepsis ([place]) 03/20/2018  . Bacteremia due to methicillin susceptible Staphylococcus aureus (MSSA) 03/18/2018  . Obesity 03/18/2018  . Nephrolithiasis 03/16/2018  . AKI (acute kidney injury) ([place]) 03/16/2018  . Cognitive impairment 03/16/2018  . Chronic pain syndrome 03/16/2018  . B12 deficiency 01/28/2016  . Memory difficulty 07/23/2015  . History of cerebrovascular disease 07/23/2015  . FH: colon cancer 08/13/2014  . Hx of adenomatous colonic polyps 08/13/2014  . Chronic back pain 05/30/2012  . CAD, NATIVE VESSEL 07/17/2010  . HYPERCHOLESTEROLEMIA 10/31/2009  . SMOKELESS TOBACCO ABUSE 10/31/2009  . Obstructive sleep apnea 10/31/2009  . Essential hypertension, benign 10/31/2009  . GERD 10/31/2009    Kipp Brood, PT, DPT Physical Therapist with [facility]  08/30/2018 5:25 PM    [facility] [address] [place], [place], [address] Phone: [phone]   Fax:  [phone]  Name: Cody Velez MRN: [id number] Date of Birth: 1946/12/10

## 2018-08-31 LAB — SEDIMENTATION RATE: Sed Rate: 19 mm/h (ref 0–20)

## 2018-08-31 LAB — C-REACTIVE PROTEIN: CRP: 2.7 mg/L (ref <8.0)

## 2018-09-01 ENCOUNTER — Ambulatory Visit (HOSPITAL_COMMUNITY): Payer: Medicare Other

## 2018-09-01 ENCOUNTER — Encounter (HOSPITAL_COMMUNITY): Payer: Self-pay

## 2018-09-01 DIAGNOSIS — R29898 Other symptoms and signs involving the musculoskeletal system: Secondary | ICD-10-CM | POA: Diagnosis not present

## 2018-09-01 DIAGNOSIS — M6281 Muscle weakness (generalized): Secondary | ICD-10-CM

## 2018-09-01 DIAGNOSIS — R2689 Other abnormalities of gait and mobility: Secondary | ICD-10-CM | POA: Diagnosis not present

## 2018-09-01 NOTE — Therapy (Signed)
[place], [place], [address] Phone: [phone]   Fax:  [phone]  Physical Therapy Treatment  Patient Details  Name: Cody Velez MRN: [id number] Date of Birth: [birth year] Referring Provider: Asencion Noble, MD   Encounter Date: 09/01/2018  PT End of Session - 09/01/18 1119    Visit Number  7    Number of Visits  25    Date for PT Re-Evaluation  10/13/18   Minireassess 09/12/18   Authorization Type  Primary: Medicare; Secondary: BCBS (75 visits per calendar year with 14 used at evaluation)    Authorization Time Period  08/15/18 - 10/14/18    Authorization - Visit Number  21    Authorization - Number of Visits  75    Equipment Utilized During Treatment  Gait belt    Activity Tolerance  Patient tolerated treatment well    Behavior During Therapy  [facility] for tasks assessed/performed       Past Medical History:  Diagnosis Date  . Allergic rhinitis   . Arthritis   . B12 deficiency   . Cervicogenic headache 01/28/2016  . Childhood asthma   . Chronic back pain   . Coronary atherosclerosis of native coronary artery    Mild nonobstructive CAD at catheterization January 2015  . Depression   . Essential hypertension   . Falls   . GERD (gastroesophageal reflux disease)   . History of cerebrovascular disease 07/23/2015  . History of pneumonia 02/2011  . Hyperlipidemia   . Kidney stone   . Memory difficulty 07/23/2015  . OSA (obstructive sleep apnea)    CPAP - Dr. [name]  . Prostate cancer ([place])   . PTSD (post-traumatic stress disorder)    [place]  . Rectal bleeding     Past Surgical History:  Procedure Laterality Date  . APPLICATION OF ROBOTIC ASSISTANCE FOR SPINAL PROCEDURE N/A 03/28/2018   Procedure: APPLICATION OF ROBOTIC ASSISTANCE FOR SPINAL PROCEDURE;  Surgeon: Kristeen Miss, MD;  Location: [place];  Service: Neurosurgery;  Laterality: N/A;  . BACK SURGERY  02/14/12   lumbar OR #7; "today redid L1L2; replaced  screws; added bone from hip"  . BILATERAL KNEE ARTHROSCOPY    . COLONOSCOPY  10/15/2008   Dr. [name]: tubular adenoma   . COLONOSCOPY  12/17/2003   IOE:VOJJKK rectal and colon  . COLONOSCOPY N/A 09/05/2014   Procedure: COLONOSCOPY;  Surgeon: Daneil Dolin, MD;  [facility];  Service: Endoscopy;  Laterality: N/A;  7:30-rescheduled 9/17 to [place] notified pt  . CYSTOSCOPY WITH STENT PLACEMENT Right 01/27/2016   Procedure: CYSTOSCOPY WITH STENT PLACEMENT;  Surgeon: [name], MD;  [facility];  Service: Urology;  Laterality: Right;  . CYSTOSCOPY/RETROGRADE/URETEROSCOPY/STONE EXTRACTION WITH BASKET Right 01/27/2016   Procedure: CYSTOSCOPY, RIGHT RETROGRADE, RIGHT URETEROSCOPY, STONE EXTRACTION ;  Surgeon: [name], MD;  Location: [facility];  Service: Urology;  Laterality: Right;  . ESOPHAGOGASTRODUODENOSCOPY  10/15/2008     Dr Rourk:Schatzki's ring status post dilation and disruption via 46 F Maloney dilator/ otherwise unremarkable esophagus, small hiatal hernia, multiple fundal gland polyps not manipulated, gastritis, negative H.pylori  . ESOPHAGOGASTRODUODENOSCOPY  06/21/02   XFG:HWEXH sliding hiatal hernia with mild changes of reflux esophagitis limited to gastroesophageal junction.  Noncritical ring at distal esophagus, 3 cm proximal to gastroesophageal junction/Antral gastritis  . [place]   "broke face playing softball"  . FRACTURE SURGERY     "left wrist; broke it; took spur  off"  . HOLMIUM LASER APPLICATION Right 02/19/2024   Procedure: HOLMIUM LASER APPLICATION;  Surgeon: [name], MD;  Location[facility];  Service: Urology;  Laterality: Right;  . KNEE ARTHROSCOPY Right 05/18/2018   Procedure: PARTIAL MEDIAL MENISECTOMY AND SURGICAL LAVAGE AND CHONDROPLASTY;  Surgeon: [name], MD;  Location: [place];  Service: Orthopedics;  Laterality: Right;  . LEFT HEART CATHETERIZATION WITH CORONARY ANGIOGRAM N/A 12/27/2013   Procedure:  LEFT HEART CATHETERIZATION WITH CORONARY ANGIOGRAM;  Surgeon: [name], MD;  Location: [facility] C[facility];  Service: Cardiovascular;  Laterality: N/A;  . neck epidural    . POSTERIOR LUMBAR FUSION 4 LEVEL N/A 03/28/2018   Procedure: Thoracic eight -Lumbar two- FIXATION WITH SCREW PLACEMENT, DECOMPRESSION Thoracic ten-Thoracic eleven  FOR OSTEOMYELITIS;  Surgeon: Kristeen Miss, MD;  Location: [place];  Service: Neurosurgery;  Laterality: N/A;  . SHOULDER SURGERY Bilateral   . TEE WITHOUT CARDIOVERSION N/A 03/21/2018   Procedure: TRANSESOPHAGEAL ECHOCARDIOGRAM (TEE) WITH PROPOFOL;  Surgeon: [name], MD;  [facility];  Service: Cardiovascular;  Laterality: N/A;    There were no vitals filed for this visit.  Subjective Assessment - 09/01/18 1119    Subjective  Patient reports his knees are really the problem today with the change in weather.     Pertinent History  T10-11 Epidural Abscess, back surgery on 03/28/18, right knee partial medial menisectomy and chondroplasty    How long can you sit comfortably?  Not limited    How long can you stand comfortably?  5 minutes holding onto something (90 seconds without)    How long can you walk comfortably?  With walker 0 minutes    Diagnostic tests  CT Lumbar Spine 05/12/18: "Spinal stimulator device, along with multilevel multisegment fusion hardware appear appropriately placed without adverse features."    Patient Stated Goals  To walk                       [facility] Adult PT Treatment/Exercise - 09/01/18 0001      Ambulation/Gait   Ambulation/Gait  Yes    Ambulation/Gait Assistance  3: Mod assist   BWS system: unweighted ~ 50%   Ambulation/Gait Assistance Details  Use of RW and WC following and mod A for safety    Ambulation Distance (Feet)  90 Feet   3 bouts of 10', 50', 20'   Assistive device  Rolling walker    Gait Pattern  Decreased dorsiflexion - left;Decreased dorsiflexion - right;Step-to pattern;Trunk  flexed;Decreased step length - right;Decreased step length - left    Ambulation Surface  Level    Gait velocity  slow      Knee/Hip Exercises: Standing   Forward Step Up  Both;5 reps;Step Height: 4";Hand Hold: 2;10 reps;1 set;Step Height: 6"    Other Standing Knee Exercises  seated: Step taps in // bars, 4" box, 1x 10 reps bil LE (manual resistance to improve proprioception)   With BWS harness system     Knee/Hip Exercises: Seated   Long Arc Quad  Both;10 reps    Other Seated Knee/Hip Exercises  heel/toe raises x 10 each    Marching  Both;10 reps;Limitations    Marching Limitations  5" holds alternating on dynadisc             PT Education - 09/01/18 1203    Education Details  Educated on exercise purpose and technique throughout session.       PT Short Term Goals - 09/01/18 1119  PT SHORT TERM GOAL #1   Title  Patient will report understanding and regular compliance with HEP to improve strength and overall functional mobility.     Time  4    Period  Weeks    Status  Partially Met      PT SHORT TERM GOAL #2   Title  Patient will demonstrate improvement of 1/2 MMT strength grade in all muscle groups tested to assist patient with safe transfers, ambulation, and standing.     Time  4    Period  Weeks    Status  On-going      PT SHORT TERM GOAL #3   Title  Patient will report or demonstrate ability to perform ambulation for 2 minutes with LRAD with no more than supervision assistance without rest break and reported no more than minimal difficulty.     Time  4    Period  Weeks    Status  Partially Met        PT Long Term Goals - 09/01/18 1119      PT LONG TERM GOAL #1   Title  Patient will report or demonstrate ability to perform ambulation for 5 minutes with LRAD with no more than supervision assistance without rest break and reported no more than minimal difficulty.     Time  8    Period  Weeks    Status  On-going      PT LONG TERM GOAL #2   Title  Patient  will report ability to stand at a support surface for 10 minutes without a rest break in order to perform household chores with increased ease.     Time  8    Period  Weeks    Status  On-going      PT LONG TERM GOAL #3   Title  Patient will demonstrate an improvement of 10% on FOTO indicating improvement in percieved functional mobility.     Time  8    Period  Weeks    Status  On-going            Plan - 09/01/18 1119    Clinical Impression Statement  Session focused on weight bearing exercises and ambulation until patient's legs gave out on him due to bilateral knee pain and muscle fatigue. Patient was able to take equal length short steps with verbal cuing. For safety during ambulation, ambulation performed either in parallel bars or with RW and WC following. Patient required intermittent rest breaks due to knee buckling. He will continue to benefit from skilled PT interventions to address impairments and would benefit from land and aquatic based exercises as well as BWSTT.    Rehab Potential  Fair    Clinical Impairments Affecting Rehab Potential  Positive: Highly motivated; Negative: Comorbidities    PT Frequency  3x / week    PT Duration  8 weeks    PT Treatment/Interventions  ADLs/Self Care Home Management;Aquatic Therapy;DME Instruction;Gait training;Functional mobility training;Stair training;Therapeutic activities;Therapeutic exercise;Balance training;Neuromuscular re-education;Patient/family education;Orthotic Fit/Training;Wheelchair mobility training;Manual techniques;Passive range of motion;Energy conservation;Dry needling;Splinting;Taping    PT Next Visit Plan  Progress standing and gait tolerance, monitor patient's blood pressure as needed. Adhere to BLT precautions until follow-up with physician. Continue BWSTT/over ground gait training. Work on ambulation, transfers, and functional lower extremity strengthening. Initiate aquatic therapy.    PT Home Exercise Plan  08/16/18:   seated DF/PF, LAQ, marching, abd with theraband and bridge    Consulted and Agree with Plan of Care  Patient       Patient will benefit from skilled therapeutic intervention in order to improve the following deficits and impairments:  Abnormal gait, Decreased balance, Decreased endurance, Decreased mobility, Difficulty walking, Hypomobility, Decreased range of motion, Decreased activity tolerance, Decreased strength, Impaired flexibility  Visit Diagnosis: Other abnormalities of gait and mobility  Other symptoms and signs involving the musculoskeletal system  Muscle weakness (generalized)     Problem List Patient Active Problem List   Diagnosis Date Noted  . Pseudogout of knee   . Pyogenic arthritis of right knee joint ([place])   . Falls frequently 05/08/2018  . Fall   . Sleep disturbance   . Post-op pain   . Hypokalemia   . Morbid obesity ([place])   . Postoperative pain   . DVT, lower extremity, distal, acute, bilateral ([place])   . Acute blood loss anemia   . Anemia of chronic disease   . Essential hypertension   . Bacteremia   . Myelopathy ([place]) 03/30/2018  . Paraplegia ([place])   . Postlaminectomy syndrome, lumbar region   . Epidural abscess   . Abdominal distension   . Encephalopathy   . Weakness of both lower extremities   . Discitis of thoracic region   . Spinal cord stimulator status   . Staphylococcus aureus sepsis ([place]) 03/20/2018  . Bacteremia due to methicillin susceptible Staphylococcus aureus (MSSA) 03/18/2018  . Obesity 03/18/2018  . Nephrolithiasis 03/16/2018  . AKI (acute kidney injury) ([place]) 03/16/2018  . Cognitive impairment 03/16/2018  . Chronic pain syndrome 03/16/2018  . B12 deficiency 01/28/2016  . Memory difficulty 07/23/2015  . History of cerebrovascular disease 07/23/2015  . FH: colon cancer 08/13/2014  . Hx of adenomatous colonic polyps 08/13/2014  . Chronic back pain 05/30/2012  . CAD, NATIVE VESSEL 07/17/2010  . HYPERCHOLESTEROLEMIA 10/31/2009   . SMOKELESS TOBACCO ABUSE 10/31/2009  . Obstructive sleep apnea 10/31/2009  . Essential hypertension, benign 10/31/2009  . GERD 10/31/2009    Floria Raveling. Hartnett-Rands, MS, PT Per [place] #[id number] 09/01/2018, 12:14 PM  [place] [address] [place], [place], [address] Phone: [phone]   Fax:  [phone]  Name: Cody Velez MRN: [id number] Date of Birth: 1946-05-19

## 2018-09-04 ENCOUNTER — Other Ambulatory Visit: Payer: Self-pay

## 2018-09-04 ENCOUNTER — Ambulatory Visit (HOSPITAL_COMMUNITY): Payer: Medicare Other

## 2018-09-04 ENCOUNTER — Encounter (HOSPITAL_COMMUNITY): Payer: Self-pay

## 2018-09-04 DIAGNOSIS — R29898 Other symptoms and signs involving the musculoskeletal system: Secondary | ICD-10-CM

## 2018-09-04 DIAGNOSIS — R2689 Other abnormalities of gait and mobility: Secondary | ICD-10-CM | POA: Diagnosis not present

## 2018-09-04 DIAGNOSIS — M6281 Muscle weakness (generalized): Secondary | ICD-10-CM

## 2018-09-04 NOTE — Therapy (Signed)
[place], [place], [address] Phone: [phone]   Fax:  [phone]  Physical Therapy Aquatic Treatment  Patient Details  Name: Cody Velez MRN: [id number] Date of Birth: [birth year] Referring Provider: Asencion Noble, MD   Encounter Date: 09/04/2018  PT End of Session - 09/04/18 1757    Visit Number  8    Number of Visits  25    Date for PT Re-Evaluation  10/13/18   Minireassess 09/12/18   Authorization Type  Primary: Medicare; Secondary: BC[employer] (75 visits per calendar year with 14 used at evaluation)    Authorization Time Period  08/15/18 - 10/14/18    Authorization - Visit Number  101    Authorization - Number of Visits  52    PT Start Time  1301    PT Stop Time  1345    PT Time Calculation (min)  44 min    Equipment Utilized During Treatment  Gait belt    Activity Tolerance  Patient tolerated treatment well    Behavior During Therapy  WFL for tasks assessed/performed       Past Medical History:  Diagnosis Date  . Allergic rhinitis   . Arthritis   . B12 deficiency   . Cervicogenic headache 01/28/2016  . Childhood asthma   . Chronic back pain   . Coronary atherosclerosis of native coronary artery    Mild nonobstructive CAD at catheterization January 2015  . Depression   . Essential hypertension   . Falls   . GERD (gastroesophageal reflux disease)   . History of cerebrovascular disease 07/23/2015  . History of pneumonia 02/2011  . Hyperlipidemia   . Kidney stone   . Memory difficulty 07/23/2015  . OSA (obstructive sleep apnea)    CPAP - Dr. [name]  . Prostate cancer ([place])   . PTSD (post-traumatic stress disorder)    [place]  . Rectal bleeding     Past Surgical History:  Procedure Laterality Date  . APPLICATION OF ROBOTIC ASSISTANCE FOR SPINAL PROCEDURE N/A 03/28/2018   Procedure: APPLICATION OF ROBOTIC ASSISTANCE FOR SPINAL PROCEDURE;  Surgeon: Kristeen Miss, MD;  Location: [facility];  Service:  Neurosurgery;  Laterality: N/A;  . BACK SURGERY  02/14/12   lumbar OR #7; "today redid L1L2; replaced screws; added bone from hip"  . BILATERAL KNEE ARTHROSCOPY    . COLONOSCOPY  10/15/2008   Dr. [name]: tubular adenoma   . COLONOSCOPY  12/17/2003   DEY:CXKGYJ rectal and colon  . COLONOSCOPY N/A 09/05/2014   Procedure: COLONOSCOPY;  Surgeon: Daneil Dolin, MD;  [facility];  Service: Endoscopy;  Laterality: N/A;  7:30-rescheduled 9/17 to [place] notified pt  . CYSTOSCOPY WITH STENT PLACEMENT Right 01/27/2016   Procedure: CYSTOSCOPY WITH STENT PLACEMENT;  Surgeon: [name], MD;  Location: [facility];  Service: Urology;  Laterality: Right;  . CYSTOSCOPY/RETROGRADE/URETEROSCOPY/STONE EXTRACTION WITH BASKET Right 01/27/2016   Procedure: CYSTOSCOPY, RIGHT RETROGRADE, RIGHT URETEROSCOPY, STONE EXTRACTION ;  Surgeon: [name], MD;  Location: [facility];  Service: Urology;  Laterality: Right;  . ESOPHAGOGASTRODUODENOSCOPY  10/15/2008     Dr Rourk:Schatzki's ring status post dilation and disruption via 62 F Maloney dilator/ otherwise unremarkable esophagus, small hiatal hernia, multiple fundal gland polyps not manipulated, gastritis, negative H.pylori  . ESOPHAGOGASTRODUODENOSCOPY  06/21/02   [facility]:DJSHF sliding hiatal hernia with mild changes of reflux esophagitis limited to gastroesophageal junction.  Noncritical ring at distal esophagus, 3 cm proximal to gastroesophageal junction/Antral gastritis  .  [place]   "broke face playing softball"  . FRACTURE SURGERY     "left wrist; broke it; took spur off"  . HOLMIUM LASER APPLICATION Right 4/0/9811   Procedure: HOLMIUM LASER APPLICATION;  Surgeon: [name], MD;  Location: [facility];  Service: Urology;  Laterality: Right;  . KNEE ARTHROSCOPY Right 05/18/2018   Procedure: PARTIAL MEDIAL MENISECTOMY AND SURGICAL LAVAGE AND CHONDROPLASTY;  Surgeon: [name], MD;  Location: [facility];  Service: Orthopedics;   Laterality: Right;  . LEFT HEART CATHETERIZATION WITH CORONARY ANGIOGRAM N/A 12/27/2013   Procedure: LEFT HEART CATHETERIZATION WITH CORONARY ANGIOGRAM;  Surgeon: [name], MD;  Location: [facility];  Service: Cardiovascular;  Laterality: N/A;  . neck epidural    . POSTERIOR LUMBAR FUSION 4 LEVEL N/A 03/28/2018   Procedure: Thoracic eight -Lumbar two- FIXATION WITH SCREW PLACEMENT, DECOMPRESSION Thoracic ten-Thoracic eleven  FOR OSTEOMYELITIS;  Surgeon: Kristeen Miss, MD;  Location: [facility];  Service: Neurosurgery;  Laterality: N/A;  . SHOULDER SURGERY Bilateral   . TEE WITHOUT CARDIOVERSION N/A 03/21/2018   Procedure: TRANSESOPHAGEAL ECHOCARDIOGRAM (TEE) WITH PROPOFOL;  Surgeon: [name], MD;  [facility];  Service: Cardiovascular;  Laterality: N/A;    There were no vitals filed for this visit.  Subjective Assessment - 09/04/18 1755    Subjective  Patient reports he is not feeling the greatest at the start of therapy session. He denies pain and states he is just not feeling as well today.    Pertinent History  T10-11 Epidural Abscess, back surgery on 03/28/18, right knee partial medial menisectomy and chondroplasty    Limitations  Standing;Walking;House hold activities;Lifting    How long can you sit comfortably?  Not limited    How long can you stand comfortably?  5 minutes holding onto something (90 seconds without)    How long can you walk comfortably?  With walker 0 minutes    Diagnostic tests  CT Lumbar Spine 05/12/18: "Spinal stimulator device, along with multilevel multisegment fusion hardware appear appropriately placed without adverse features."    Patient Stated Goals  To walk    Currently in Pain?  No/denies      Transfers:  Squat pivot transfer performed 2x with supervision from manual wheelchair to pool chair lift. Patient utilized pool chair lift to get in/out of pool for therapy session.   Adult Aquatic Therapy - 09/04/18 1759      Aquatic Therapy  Subjective   Subjective  Patient reports he is not feeling the greatest at the start of therapy session. He denies pain and states he is just not feeling as well today.      Treatment   Gait  Forward gait with floatation belt and bil blue UE hand bars. Multiple repetitions for ~ 25' with manual facilitation to deter scissoring gait.      Exercises  Side stepping along wall, 3x RT, 20' with manual facilitation for Rt/Lt foot placement to deter ankle rolling and crossing of legs. Wall squat with heel raise, 1x 15 reps. Heel raises with 3 sec holds, 2x15 reps. Noodle leg press, 1x 15 reps with 1 HHA for bil LE. March with LAQ, 1x 15 reps bil LE.    Balance  Ai Chi "gathering" pose, 3x bilaterally, with float belt and blue hand bars.        PT Education - 09/04/18 1757    Education Details  Educated on safety with pool therapy and on use of chair  lift for getting in/out of pool. Edcuated on exercises throughout session.    Person(s) Educated  Patient    Methods  Explanation;Demonstration;Verbal cues    Comprehension  Verbalized understanding;Returned demonstration       PT Short Term Goals - 09/01/18 1119      PT SHORT TERM GOAL #1   Title  Patient will report understanding and regular compliance with HEP to improve strength and overall functional mobility.     Time  4    Period  Weeks    Status  Partially Met      PT SHORT TERM GOAL #2   Title  Patient will demonstrate improvement of 1/2 MMT strength grade in all muscle groups tested to assist patient with safe transfers, ambulation, and standing.     Time  4    Period  Weeks    Status  On-going      PT SHORT TERM GOAL #3   Title  Patient will report or demonstrate ability to perform ambulation for 2 minutes with LRAD with no more than supervision assistance without rest break and reported no more than minimal difficulty.     Time  4    Period  Weeks    Status  Partially Met        PT Long Term Goals - 09/01/18 1119      PT  LONG TERM GOAL #1   Title  Patient will report or demonstrate ability to perform ambulation for 5 minutes with LRAD with no more than supervision assistance without rest break and reported no more than minimal difficulty.     Time  8    Period  Weeks    Status  On-going      PT LONG TERM GOAL #2   Title  Patient will report ability to stand at a support surface for 10 minutes without a rest break in order to perform household chores with increased ease.     Time  8    Period  Weeks    Status  On-going      PT LONG TERM GOAL #3   Title  Patient will demonstrate an improvement of 10% on FOTO indicating improvement in percieved functional mobility.     Time  8    Period  Weeks    Status  On-going       Plan - 09/04/18 1759    Clinical Impression Statement  Initiated aquatic therapy this session, utilized pool chair lift to transfer into/out of pool for therapy. [employer] lifeguard operated chair lift. He denied any open sores or skin breakdown to his knowledge, and has regular B/B management program. Patient wore XL flotation belt the entire session for safety. He was able to perform standing exercises throughout the entire session and ambulated with minimal assistance to prevent scissoring steps. He was able to perform heel raises and squats in ~ chest deep water providing ~ 50% or slightly greater decrease in body weight. Patient reported feeling great after therapy session and stated he was enjoying himself multiple times. He will continue to benefit from skilled PT interventions to address impairments and would benefit in aquatic and land based exercises as well as BWSTT.    Rehab Potential  Fair    Clinical Impairments Affecting Rehab Potential  Positive: Highly motivated; Negative: Comorbidities    PT Frequency  3x / week    PT Duration  8 weeks    PT Treatment/Interventions  ADLs/Self Care Home Management;Aquatic Therapy;DME Instruction;Gait  training;Functional mobility training;Stair  training;Therapeutic activities;Therapeutic exercise;Balance training;Neuromuscular re-education;Patient/family education;Orthotic Fit/Training;Wheelchair mobility training;Manual techniques;Passive range of motion;Energy conservation;Dry needling;Splinting;Taping    PT Next Visit Plan  Progress standing and gait tolerance, monitor patient's blood pressure as needed. Adhere to BLT precautions until follow-up with physician. Continue BWSTT/over ground gait training. Work on ambulation, transfers, and functional lower extremity strengthening. Continue with gait training in aquatic setting and LE strengthening.    PT Home Exercise Plan  08/16/18:  seated DF/PF, LAQ, marching, abd with theraband and bridge    Consulted and Agree with Plan of Care  Patient       Patient will benefit from skilled therapeutic intervention in order to improve the following deficits and impairments:  Abnormal gait, Decreased balance, Decreased endurance, Decreased mobility, Difficulty walking, Hypomobility, Decreased range of motion, Decreased activity tolerance, Decreased strength, Impaired flexibility  Visit Diagnosis: Other abnormalities of gait and mobility  Other symptoms and signs involving the musculoskeletal system  Muscle weakness (generalized)     Problem List Patient Active Problem List   Diagnosis Date Noted  . Pseudogout of knee   . Pyogenic arthritis of right knee joint ([place])   . Falls frequently 05/08/2018  . Fall   . Sleep disturbance   . Post-op pain   . Hypokalemia   . Morbid obesity ([place])   . Postoperative pain   . DVT, lower extremity, distal, acute, bilateral ([place])   . Acute blood loss anemia   . Anemia of chronic disease   . Essential hypertension   . Bacteremia   . Myelopathy ([place]) 03/30/2018  . Paraplegia ([place])   . Postlaminectomy syndrome, lumbar region   . Epidural abscess   . Abdominal distension   . Encephalopathy   . Weakness of both lower extremities   . Discitis of  thoracic region   . Spinal cord stimulator status   . Staphylococcus aureus sepsis ([place]) 03/20/2018  . Bacteremia due to methicillin susceptible Staphylococcus aureus (MSSA) 03/18/2018  . Obesity 03/18/2018  . Nephrolithiasis 03/16/2018  . AKI (acute kidney injury) ([place]) 03/16/2018  . Cognitive impairment 03/16/2018  . Chronic pain syndrome 03/16/2018  . B12 deficiency 01/28/2016  . Memory difficulty 07/23/2015  . History of cerebrovascular disease 07/23/2015  . FH: colon cancer 08/13/2014  . Hx of adenomatous colonic polyps 08/13/2014  . Chronic back pain 05/30/2012  . CAD, NATIVE VESSEL 07/17/2010  . HYPERCHOLESTEROLEMIA 10/31/2009  . SMOKELESS TOBACCO ABUSE 10/31/2009  . Obstructive sleep apnea 10/31/2009  . Essential hypertension, benign 10/31/2009  . GERD 10/31/2009    Kipp Brood, PT, DPT Physical Therapist with [facility]  09/04/2018 6:05 PM    [facility] [address] [place], [place], [address] Phone: [phone]   Fax:  [phone]  Name: Cody Velez MRN: [id number] Date of Birth: 06-08-1946

## 2018-09-06 ENCOUNTER — Encounter (HOSPITAL_COMMUNITY): Payer: Self-pay

## 2018-09-06 ENCOUNTER — Ambulatory Visit (HOSPITAL_COMMUNITY): Payer: Medicare Other

## 2018-09-06 DIAGNOSIS — R2689 Other abnormalities of gait and mobility: Secondary | ICD-10-CM | POA: Diagnosis not present

## 2018-09-06 DIAGNOSIS — R29898 Other symptoms and signs involving the musculoskeletal system: Secondary | ICD-10-CM | POA: Diagnosis not present

## 2018-09-06 DIAGNOSIS — M6281 Muscle weakness (generalized): Secondary | ICD-10-CM

## 2018-09-06 NOTE — Therapy (Signed)
[place], [place], [address] Phone: [phone]   Fax:  [phone]  Physical Therapy Treatment  Patient Details  Name: Cody Velez MRN: [id number] Date of Birth: 14-Aug-1946 Referring Provider: Asencion Noble, MD   Encounter Date: 09/06/2018  PT End of Session - 09/06/18 0957    Visit Number  9    Number of Visits  25    Date for PT Re-Evaluation  10/13/18   MInireassess 09/12/18   Authorization Type  Primary: Medicare; Secondary: BCBS (75 visits per calendar year with 14 used at evaluation)    Authorization Time Period  08/15/18 - 10/14/18    Authorization - Visit Number  23    Authorization - Number of Visits  66    PT Start Time  0856    PT Stop Time  0943    PT Time Calculation (min)  47 min    Equipment Utilized During Treatment  Other (comment)   BWS    Activity Tolerance  Patient tolerated treatment well    Behavior During Therapy  [facility] for tasks assessed/performed       Past Medical History:  Diagnosis Date  . Allergic rhinitis   . Arthritis   . B12 deficiency   . Cervicogenic headache 01/28/2016  . Childhood asthma   . Chronic back pain   . Coronary atherosclerosis of native coronary artery    Mild nonobstructive CAD at catheterization January 2015  . Depression   . Essential hypertension   . Falls   . GERD (gastroesophageal reflux disease)   . History of cerebrovascular disease 07/23/2015  . History of pneumonia 02/2011  . Hyperlipidemia   . Kidney stone   . Memory difficulty 07/23/2015  . OSA (obstructive sleep apnea)    CPAP - Dr. [name]  . Prostate cancer ([place])   . PTSD (post-traumatic stress disorder)    [place]  . Rectal bleeding     Past Surgical History:  Procedure Laterality Date  . APPLICATION OF ROBOTIC ASSISTANCE FOR SPINAL PROCEDURE N/A 03/28/2018   Procedure: APPLICATION OF ROBOTIC ASSISTANCE FOR SPINAL PROCEDURE;  Surgeon: Kristeen Miss, MD;  Location: [place];  Service:  Neurosurgery;  Laterality: N/A;  . BACK SURGERY  02/14/12   lumbar OR #7; "today redid L1L2; replaced screws; added bone from hip"  . BILATERAL KNEE ARTHROSCOPY    . COLONOSCOPY  10/15/2008   Dr. [name]: tubular adenoma   . COLONOSCOPY  12/17/2003   GYF:[facility] rectal and colon  . COLONOSCOPY N/A 09/05/2014   Procedure: COLONOSCOPY;  Surgeon: Daneil Dolin, MD;  [facility];  Service: Endoscopy;  Laterality: N/A;  7:30-rescheduled 9/17 to [place] notified pt  . CYSTOSCOPY WITH STENT PLACEMENT Right 01/27/2016   Procedure: CYSTOSCOPY WITH STENT PLACEMENT;  Surgeon: [name], MD;  Location: [facility];  Service: Urology;  Laterality: Right;  . CYSTOSCOPY/RETROGRADE/URETEROSCOPY/STONE EXTRACTION WITH BASKET Right 01/27/2016   Procedure: CYSTOSCOPY, RIGHT RETROGRADE, RIGHT URETEROSCOPY, STONE EXTRACTION ;  Surgeon: [name], MD;  Location: [facility];  Service: Urology;  Laterality: Right;  . ESOPHAGOGASTRODUODENOSCOPY  10/15/2008     Dr Rourk:Schatzki's ring status post dilation and disruption via 11 F Maloney dilator/ otherwise unremarkable esophagus, small hiatal hernia, multiple fundal gland polyps not manipulated, gastritis, negative H.pylori  . ESOPHAGOGASTRODUODENOSCOPY  06/21/02   QPR:FFMBW sliding hiatal hernia with mild changes of reflux esophagitis limited to gastroesophageal junction.  Noncritical ring at distal esophagus, 3 cm proximal to gastroesophageal junction/Antral  gastritis  . [place]   "broke face playing softball"  . FRACTURE SURGERY     "left wrist; broke it; took spur off"  . HOLMIUM LASER APPLICATION Right 05/20/4708   Procedure: HOLMIUM LASER APPLICATION;  Surgeon: [name], MD;  Location: [facility];  Service: Urology;  Laterality: Right;  . KNEE ARTHROSCOPY Right 05/18/2018   Procedure: PARTIAL MEDIAL MENISECTOMY AND SURGICAL LAVAGE AND CHONDROPLASTY;  Surgeon: [name], MD;  Location: [facility];  Service: Orthopedics;   Laterality: Right;  . LEFT HEART CATHETERIZATION WITH CORONARY ANGIOGRAM N/A 12/27/2013   Procedure: LEFT HEART CATHETERIZATION WITH CORONARY ANGIOGRAM;  Surgeon: [name], MD;  Location: [facility] C[facility];  Service: Cardiovascular;  Laterality: N/A;  . neck epidural    . POSTERIOR LUMBAR FUSION 4 LEVEL N/A 03/28/2018   Procedure: Thoracic eight -Lumbar two- FIXATION WITH SCREW PLACEMENT, DECOMPRESSION Thoracic ten-Thoracic eleven  FOR OSTEOMYELITIS;  Surgeon: Kristeen Miss, MD;  Location: [facility];  Service: Neurosurgery;  Laterality: N/A;  . SHOULDER SURGERY Bilateral   . TEE WITHOUT CARDIOVERSION N/A 03/21/2018   Procedure: TRANSESOPHAGEAL ECHOCARDIOGRAM (TEE) WITH PROPOFOL;  Surgeon: [name], MD;  [facility];  Service: Cardiovascular;  Laterality: N/A;    There were no vitals filed for this visit.  Subjective Assessment - 09/06/18 0925    Subjective  Pt stated he liked the pool, he felt good yesterday.  Currently has some dull pain in Rt side of lower back, pain scale 2/10.    Pertinent History  T10-11 Epidural Abscess, back surgery on 03/28/18, right knee partial medial menisectomy and chondroplasty    Diagnostic tests  CT Lumbar Spine 05/12/18: "Spinal stimulator device, along with multilevel multisegment fusion hardware appear appropriately placed without adverse features."    Patient Stated Goals  To walk    Currently in Pain?  Yes    Pain Score  2     Pain Location  Back    Pain Orientation  Lower;Right    Pain Descriptors / Indicators  Aching;Dull;Sore    Pain Onset  More than a month ago    Pain Frequency  Intermittent    Aggravating Factors   rolling over in bed    Pain Relieving Factors  pain meds    Effect of Pain on Daily Activities  limits    Multiple Pain Sites  No                       [facility] Adult PT Treatment/Exercise - 09/06/18 0001      Exercises   Exercises  Knee/Hip      Knee/Hip Exercises: Standing   Forward Step Up  Both;2  sets;5 reps;Hand Hold: 2;Step Height: 4"    Forward Step Up Limitations  body weight support harness system: manual facilitation of foot placement and tactile cue at quadriceps tendon for knee extension in standing when step up complete    Other Standing Knee Exercises  sidestep in //bars 3RT, cueing for foot placement      Knee/Hip Exercises: Seated   Long Arc Quad  Both;2 sets;10 reps    Long Arc Quad Weight  2 lbs.    Long [employer] Limitations  2# with 2nd set, 5" holds    Other Seated Knee/Hip Exercises  heel/toe raises x 20 each    Marching  Both;10 reps;Limitations    Marching Limitations  5" holds alternating on dynadisc    Sit to [employer]  1 set;10 reps;with UE support               PT Short Term Goals - 09/01/18 1119      PT SHORT TERM GOAL #1   Title  Patient will report understanding and regular compliance with HEP to improve strength and overall functional mobility.     Time  4    Period  Weeks    Status  Partially Met      PT SHORT TERM GOAL #2   Title  Patient will demonstrate improvement of 1/2 MMT strength grade in all muscle groups tested to assist patient with safe transfers, ambulation, and standing.     Time  4    Period  Weeks    Status  On-going      PT SHORT TERM GOAL #3   Title  Patient will report or demonstrate ability to perform ambulation for 2 minutes with LRAD with no more than supervision assistance without rest break and reported no more than minimal difficulty.     Time  4    Period  Weeks    Status  Partially Met        PT Long Term Goals - 09/01/18 1119      PT LONG TERM GOAL #1   Title  Patient will report or demonstrate ability to perform ambulation for 5 minutes with LRAD with no more than supervision assistance without rest break and reported no more than minimal difficulty.     Time  8    Period  Weeks    Status  On-going      PT LONG TERM GOAL #2   Title  Patient will report ability to stand at a support surface for 10  minutes without a rest break in order to perform household chores with increased ease.     Time  8    Period  Weeks    Status  On-going      PT LONG TERM GOAL #3   Title  Patient will demonstrate an improvement of 10% on FOTO indicating improvement in percieved functional mobility.     Time  8    Period  Weeks    Status  On-going            Plan - 09/06/18 1209    Clinical Impression Statement  Resumed BWS for open and closed chain strengthening exercises.  Used colored spots to improve foot placement with step up training for functional strengthening inside // bars with UE support and manual facilitation for foot placement of bil LE form.  Pt continues to demonstrate LE weakness noted by knees giving out due to fatigue, required seated rest breaks.  Verbal and tactile cueing required to improve quadricep activation for knee extension in standing.  Was able to add 2# weight with LAQs, cueing to slow down and full range.    Rehab Potential  Fair    Clinical Impairments Affecting Rehab Potential  Positive: Highly motivated; Negative: Comorbidities    PT Frequency  3x / week    PT Duration  8 weeks    PT Treatment/Interventions  ADLs/Self Care Home Management;Aquatic Therapy;DME Instruction;Gait training;Functional mobility training;Stair training;Therapeutic activities;Therapeutic exercise;Balance training;Neuromuscular re-education;Patient/family education;Orthotic Fit/Training;Wheelchair mobility training;Manual techniques;Passive range of motion;Energy conservation;Dry needling;Splinting;Taping    PT Next Visit Plan  Progress standing and gait tolerance, monitor patient's blood pressure as needed. Adhere to BLT precautions until follow-up with physician. Continue BWSTT/over ground gait training. Work on ambulation, transfers, and functional lower  extremity strengthening. Continue with gait training in aquatic setting and LE strengthening.    PT Home Exercise Plan  08/16/18:  seated DF/PF,  LAQ, marching, abd with theraband and bridge       Patient will benefit from skilled therapeutic intervention in order to improve the following deficits and impairments:  Abnormal gait, Decreased balance, Decreased endurance, Decreased mobility, Difficulty walking, Hypomobility, Decreased range of motion, Decreased activity tolerance, Decreased strength, Impaired flexibility  Visit Diagnosis: Other abnormalities of gait and mobility  Other symptoms and signs involving the musculoskeletal system  Muscle weakness (generalized)     Problem List Patient Active Problem List   Diagnosis Date Noted  . Pseudogout of knee   . Pyogenic arthritis of right knee joint ([place])   . Falls frequently 05/08/2018  . Fall   . Sleep disturbance   . Post-op pain   . Hypokalemia   . Morbid obesity ([place])   . Postoperative pain   . DVT, lower extremity, distal, acute, bilateral ([place])   . Acute blood loss anemia   . Anemia of chronic disease   . Essential hypertension   . Bacteremia   . Myelopathy ([place]) 03/30/2018  . Paraplegia ([place])   . Postlaminectomy syndrome, lumbar region   . Epidural abscess   . Abdominal distension   . Encephalopathy   . Weakness of both lower extremities   . Discitis of thoracic region   . Spinal cord stimulator status   . Staphylococcus aureus sepsis ([place]) 03/20/2018  . Bacteremia due to methicillin susceptible Staphylococcus aureus (MSSA) 03/18/2018  . Obesity 03/18/2018  . Nephrolithiasis 03/16/2018  . AKI (acute kidney injury) ([place]) 03/16/2018  . Cognitive impairment 03/16/2018  . Chronic pain syndrome 03/16/2018  . B12 deficiency 01/28/2016  . Memory difficulty 07/23/2015  . History of cerebrovascular disease 07/23/2015  . FH: colon cancer 08/13/2014  . Hx of adenomatous colonic polyps 08/13/2014  . Chronic back pain 05/30/2012  . CAD, NATIVE VESSEL 07/17/2010  . HYPERCHOLESTEROLEMIA 10/31/2009  . SMOKELESS TOBACCO ABUSE 10/31/2009  . Obstructive sleep  apnea 10/31/2009  . Essential hypertension, benign 10/31/2009  . GERD 10/31/2009   Cody Velez, [place]; [place]  Aldona Lento 09/06/2018, 1:00 PM  [facility] [address] [place], [place], [address] Phone: [phone]   Fax:  [phone]  Name: Cody Velez MRN: [id number] Date of Birth: [birth year]

## 2018-09-08 ENCOUNTER — Ambulatory Visit (HOSPITAL_COMMUNITY): Payer: Medicare Other | Admitting: Physical Therapy

## 2018-09-08 ENCOUNTER — Encounter (HOSPITAL_COMMUNITY): Payer: Self-pay | Admitting: Physical Therapy

## 2018-09-08 DIAGNOSIS — R2689 Other abnormalities of gait and mobility: Secondary | ICD-10-CM | POA: Diagnosis not present

## 2018-09-08 DIAGNOSIS — R29898 Other symptoms and signs involving the musculoskeletal system: Secondary | ICD-10-CM

## 2018-09-08 DIAGNOSIS — M6281 Muscle weakness (generalized): Secondary | ICD-10-CM | POA: Diagnosis not present

## 2018-09-08 NOTE — Therapy (Addendum)
[place], [place], [address] Phone: [phone]   Fax:  [phone]  Physical Therapy Treatment / Progress Note  Patient Details  Name: Cody Velez MRN: [id number] Date of Birth: [birth year] Referring Provider: Asencion Noble, MD   Encounter Date: 09/08/2018   Progress Note Reporting Period  08/15/18 to 09/08/18  See note below for Objective Data and Assessment of Progress/Goals.      PT End of Session - 09/08/18 1040    Visit Number  10    Number of Visits  25    Date for PT Re-Evaluation  10/13/18   MInireassess 09/12/18   Authorization Type  Primary: Medicare; Secondary: BCBS (75 visits per calendar year with 14 used at evaluation)    Authorization Time Period  08/15/18 - 10/14/18    Authorization - Visit Number  24    Authorization - Number of Visits  93    PT Start Time  0950    PT Stop Time  1030    PT Time Calculation (min)  40 min    Equipment Utilized During Treatment  Other (comment)   BWSS    Activity Tolerance  Patient limited by fatigue;Patient limited by pain    Behavior During Therapy  [facility] for tasks assessed/performed       Past Medical History:  Diagnosis Date  . Allergic rhinitis   . Arthritis   . B12 deficiency   . Cervicogenic headache 01/28/2016  . Childhood asthma   . Chronic back pain   . Coronary atherosclerosis of native coronary artery    Mild nonobstructive CAD at catheterization January 2015  . Depression   . Essential hypertension   . Falls   . GERD (gastroesophageal reflux disease)   . History of cerebrovascular disease 07/23/2015  . History of pneumonia 02/2011  . Hyperlipidemia   . Kidney stone   . Memory difficulty 07/23/2015  . OSA (obstructive sleep apnea)    CPAP - Dr. [name]  . Prostate cancer ([place])   . PTSD (post-traumatic stress disorder)    [place]  . Rectal bleeding     Past Surgical History:  Procedure Laterality Date  . APPLICATION OF ROBOTIC  ASSISTANCE FOR SPINAL PROCEDURE N/A 03/28/2018   Procedure: APPLICATION OF ROBOTIC ASSISTANCE FOR SPINAL PROCEDURE;  Surgeon: Kristeen Miss, MD;  Location: [place];  Service: Neurosurgery;  Laterality: N/A;  . BACK SURGERY  02/14/12   lumbar OR #7; "today redid L1L2; replaced screws; added bone from hip"  . BILATERAL KNEE ARTHROSCOPY    . COLONOSCOPY  10/15/2008   Dr. [name]: tubular adenoma   . COLONOSCOPY  12/17/2003   VVO:HYWVPX rectal and colon  . COLONOSCOPY N/A 09/05/2014   Procedure: COLONOSCOPY;  Surgeon: Daneil Dolin, MD;  [facility];  Service: Endoscopy;  Laterality: N/A;  7:30-rescheduled 9/17 to [place] notified pt  . CYSTOSCOPY WITH STENT PLACEMENT Right 01/27/2016   Procedure: CYSTOSCOPY WITH STENT PLACEMENT;  Surgeon: [name], MD;  [facility];  Service: Urology;  Laterality: Right;  . CYSTOSCOPY/RETROGRADE/URETEROSCOPY/STONE EXTRACTION WITH BASKET Right 01/27/2016   Procedure: CYSTOSCOPY, RIGHT RETROGRADE, RIGHT URETEROSCOPY, STONE EXTRACTION ;  Surgeon: [name], MD;  [facility];  Service: Urology;  Laterality: Right;  . ESOPHAGOGASTRODUODENOSCOPY  10/15/2008     Dr Rourk:Schatzki's ring status post dilation and disruption via 81 F Maloney dilator/ otherwise unremarkable esophagus, small hiatal hernia, multiple fundal gland polyps not manipulated, gastritis, negative H.pylori  .  ESOPHAGOGASTRODUODENOSCOPY  06/21/02   [facility]:WCBJS sliding hiatal hernia with mild changes of reflux esophagitis limited to gastroesophageal junction.  Noncritical ring at distal esophagus, 3 cm proximal to gastroesophageal junction/Antral gastritis  . [place]   "broke face playing softball"  . FRACTURE SURGERY     "left wrist; broke it; took spur off"  . HOLMIUM LASER APPLICATION Right 01/27/3150   Procedure: HOLMIUM LASER APPLICATION;  Surgeon: [name], MD;  [facility];  Service: Urology;  Laterality: Right;  . KNEE  ARTHROSCOPY Right 05/18/2018   Procedure: PARTIAL MEDIAL MENISECTOMY AND SURGICAL LAVAGE AND CHONDROPLASTY;  Surgeon: [name], MD;  Location: [facility];  Service: Orthopedics;  Laterality: Right;  . LEFT HEART CATHETERIZATION WITH CORONARY ANGIOGRAM N/A 12/27/2013   Procedure: LEFT HEART CATHETERIZATION WITH CORONARY ANGIOGRAM;  Surgeon: [name], MD;  Location: [facility] CATH LAB;  Service: Cardiovascular;  Laterality: N/A;  . neck epidural    . POSTERIOR LUMBAR FUSION 4 LEVEL N/A 03/28/2018   Procedure: Thoracic eight -Lumbar two- FIXATION WITH SCREW PLACEMENT, DECOMPRESSION Thoracic ten-Thoracic eleven  FOR OSTEOMYELITIS;  Surgeon: Kristeen Miss, MD;  Location: [facility];  Service: Neurosurgery;  Laterality: N/A;  . SHOULDER SURGERY Bilateral   . TEE WITHOUT CARDIOVERSION N/A 03/21/2018   Procedure: TRANSESOPHAGEAL ECHOCARDIOGRAM (TEE) WITH PROPOFOL;  Surgeon: [name], MD;  [facility];  Service: Cardiovascular;  Laterality: N/A;    There were no vitals filed for this visit.  Subjective Assessment - 09/08/18 1036    Subjective  Patient reported he is tired today. After standing for a little bit said that his knees were sore. Stated he has not been doing his exercises at home as much as he should be.     Pertinent History  T10-11 Epidural Abscess, back surgery on 03/28/18, right knee partial medial menisectomy and chondroplasty    Diagnostic tests  CT Lumbar Spine 05/12/18: "Spinal stimulator device, along with multilevel multisegment fusion hardware appear appropriately placed without adverse features."    Patient Stated Goals  To walk    Currently in Pain?  No/denies                       [facility] Adult PT Treatment/Exercise - 09/08/18 0001      Ambulation/Gait   Ambulation Distance (Feet)  10 Feet    Assistive device  Rolling walker   BWSS   Gait Comments  Cues to stand up tall      Knee/Hip Exercises: Standing   Forward Step Up  Both;5 reps;Hand Hold:  2;Step Height: 4";1 set    Forward Step Up Limitations  body weight support harness system: manual facilitation of foot placement and tactile cue at quadriceps tendon for knee extension in standing when step up complete    Other Standing Knee Exercises  sidestep in //bars 1RT, cueing for foot placement and upright posture      Knee/Hip Exercises: Seated   Long Arc Quad  Both;2 sets;10 reps    Long Arc Quad Weight  2 lbs.    Long Arc Quad Limitations  2# 3'' holds throughout    Other Seated Knee/Hip Exercises  Seated hamstring stretch x 1 minute each leg on 8'' step. heel/toe raises x 20 each    Marching  Both;10 reps;Limitations    Marching Limitations  Holding with 2# ankle weight             PT Education - 09/08/18 1039  Education Details  Educated about making sure to do pressure relief at least every 20 minutes.    Person(s) Educated  Patient    Methods  Explanation    Comprehension  Verbalized understanding       PT Short Term Goals - 09/01/18 1119      PT SHORT TERM GOAL #1   Title  Patient will report understanding and regular compliance with HEP to improve strength and overall functional mobility.     Time  4    Period  Weeks    Status  Partially Met      PT SHORT TERM GOAL #2   Title  Patient will demonstrate improvement of 1/2 MMT strength grade in all muscle groups tested to assist patient with safe transfers, ambulation, and standing.     Time  4    Period  Weeks    Status  On-going      PT SHORT TERM GOAL #3   Title  Patient will report or demonstrate ability to perform ambulation for 2 minutes with LRAD with no more than supervision assistance without rest break and reported no more than minimal difficulty.     Time  4    Period  Weeks    Status  Partially Met        PT Long Term Goals - 09/01/18 1119      PT LONG TERM GOAL #1   Title  Patient will report or demonstrate ability to perform ambulation for 5 minutes with LRAD with no more than  supervision assistance without rest break and reported no more than minimal difficulty.     Time  8    Period  Weeks    Status  On-going      PT LONG TERM GOAL #2   Title  Patient will report ability to stand at a support surface for 10 minutes without a rest break in order to perform household chores with increased ease.     Time  8    Period  Weeks    Status  On-going      PT LONG TERM GOAL #3   Title  Patient will demonstrate an improvement of 10% on FOTO indicating improvement in percieved functional mobility.     Time  8    Period  Weeks    Status  On-going            Plan - 09/08/18 1051    Clinical Impression Statement  Patient has made progress towards goals as he has been educated on HEP and reports intermittent compliance with performing HEP. Patient has also demonstrated improvement in distance ambulated as he has performed ambulation for up to 81' with Mod A and RW during treatments since evaluation, as opposed to 0' at initial evaluation. This session, however, patient's ambulation distance was limited due to bilateral pain in patient's knees. This session continued with BWSS. Patient reported not having slept well the night before. With activities using BWSS this session, patient's knees demonstrated frequent instances of buckling and patient also reported knee discomfort. Due to frequent knee buckling and knee discomfort, limited the amount of standing activities inside the BWSS. This session added seated hamstring stretch and continue with seated strengthening activities. Plan to continue with standing functional activities inside the parallel bars with the BWSS as tolerated at the next session. Patient would benefit from continued skilled physical therapy in order to continue progressing towards functional goals.     Rehab Potential  Fair  Clinical Impairments Affecting Rehab Potential  Positive: Highly motivated; Negative: Comorbidities    PT Frequency  3x / week     PT Duration  8 weeks    PT Treatment/Interventions  ADLs/Self Care Home Management;Aquatic Therapy;DME Instruction;Gait training;Functional mobility training;Stair training;Therapeutic activities;Therapeutic exercise;Balance training;Neuromuscular re-education;Patient/family education;Orthotic Fit/Training;Wheelchair mobility training;Manual techniques;Passive range of motion;Energy conservation;Dry needling;Splinting;Taping    PT Next Visit Plan  Progress standing and gait tolerance, monitor patient's blood pressure as needed. Adhere to BLT precautions until follow-up with physician. Continue BWSTT/over ground gait training. Work on ambulation, transfers, and functional lower extremity strengthening. Continue with gait training in aquatic setting and LE strengthening.    PT Home Exercise Plan  08/16/18:  seated DF/PF, LAQ, marching, abd with theraband and bridge       Patient will benefit from skilled therapeutic intervention in order to improve the following deficits and impairments:  Abnormal gait, Decreased balance, Decreased endurance, Decreased mobility, Difficulty walking, Hypomobility, Decreased range of motion, Decreased activity tolerance, Decreased strength, Impaired flexibility  Visit Diagnosis: Other abnormalities of gait and mobility  Other symptoms and signs involving the musculoskeletal system  Muscle weakness (generalized)     Problem List Patient Active Problem List   Diagnosis Date Noted  . Pseudogout of knee   . Pyogenic arthritis of right knee joint ([place])   . Falls frequently 05/08/2018  . Fall   . Sleep disturbance   . Post-op pain   . Hypokalemia   . Morbid obesity ([place])   . Postoperative pain   . DVT, lower extremity, distal, acute, bilateral ([place])   . Acute blood loss anemia   . Anemia of chronic disease   . Essential hypertension   . Bacteremia   . Myelopathy ([place]) 03/30/2018  . Paraplegia ([place])   . Postlaminectomy syndrome, lumbar region   . Epidural  abscess   . Abdominal distension   . Encephalopathy   . Weakness of both lower extremities   . Discitis of thoracic region   . Spinal cord stimulator status   . Staphylococcus aureus sepsis ([place]) 03/20/2018  . Bacteremia due to methicillin susceptible Staphylococcus aureus (MSSA) 03/18/2018  . Obesity 03/18/2018  . Nephrolithiasis 03/16/2018  . AKI (acute kidney injury) ([place]) 03/16/2018  . Cognitive impairment 03/16/2018  . Chronic pain syndrome 03/16/2018  . B12 deficiency 01/28/2016  . Memory difficulty 07/23/2015  . History of cerebrovascular disease 07/23/2015  . FH: colon cancer 08/13/2014  . Hx of adenomatous colonic polyps 08/13/2014  . Chronic back pain 05/30/2012  . CAD, NATIVE VESSEL 07/17/2010  . HYPERCHOLESTEROLEMIA 10/31/2009  . SMOKELESS TOBACCO ABUSE 10/31/2009  . Obstructive sleep apnea 10/31/2009  . Essential hypertension, benign 10/31/2009  . GERD 10/31/2009   Clarene Critchley PT, DPT 10:53 AM, 09/08/18 [place], [place], [address] Phone: [phone]   Fax:  [phone]  Name: Cody Velez MRN: [id number] Date of Birth: 08-02-1946

## 2018-09-11 ENCOUNTER — Ambulatory Visit (HOSPITAL_COMMUNITY): Payer: Medicare Other | Admitting: Physical Therapy

## 2018-09-11 DIAGNOSIS — I10 Essential (primary) hypertension: Secondary | ICD-10-CM | POA: Diagnosis not present

## 2018-09-11 DIAGNOSIS — M6281 Muscle weakness (generalized): Secondary | ICD-10-CM

## 2018-09-11 DIAGNOSIS — Z79899 Other long term (current) drug therapy: Secondary | ICD-10-CM | POA: Diagnosis not present

## 2018-09-11 DIAGNOSIS — R29898 Other symptoms and signs involving the musculoskeletal system: Secondary | ICD-10-CM | POA: Diagnosis not present

## 2018-09-11 DIAGNOSIS — R2689 Other abnormalities of gait and mobility: Secondary | ICD-10-CM | POA: Diagnosis not present

## 2018-09-11 DIAGNOSIS — E876 Hypokalemia: Secondary | ICD-10-CM | POA: Diagnosis not present

## 2018-09-11 NOTE — Therapy (Signed)
[place], [place], [address] Phone: [phone]   Fax:  [phone]  Physical Therapy Treatment  Patient Details  Name: Cody Velez MRN: [id number] Date of Birth: Jun 28, 1946 Referring Provider: Asencion Noble, MD   Encounter Date: 09/11/2018  PT End of Session - 09/11/18 1149    Visit Number  11    Number of Visits  25    Date for PT Re-Evaluation  10/13/18   MInireassess 09/12/18   Authorization Type  Primary: Medicare; Secondary: BCBS (75 visits per calendar year with 14 used at evaluation)    Authorization Time Period  08/15/18 - 10/14/18    Authorization - Visit Number  25    Authorization - Number of Visits  75    PT Start Time  1110    PT Stop Time  1155    PT Time Calculation (min)  45 min    Equipment Utilized During Treatment  Other (comment)   BWSS    Activity Tolerance  Patient limited by fatigue;Patient limited by pain    Behavior During Therapy  [facility] for tasks assessed/performed       Past Medical History:  Diagnosis Date  . Allergic rhinitis   . Arthritis   . B12 deficiency   . Cervicogenic headache 01/28/2016  . Childhood asthma   . Chronic back pain   . Coronary atherosclerosis of native coronary artery    Mild nonobstructive CAD at catheterization January 2015  . Depression   . Essential hypertension   . Falls   . GERD (gastroesophageal reflux disease)   . History of cerebrovascular disease 07/23/2015  . History of pneumonia 02/2011  . Hyperlipidemia   . Kidney stone   . Memory difficulty 07/23/2015  . OSA (obstructive sleep apnea)    CPAP - Dr. [name]  . Prostate cancer ([place])   . PTSD (post-traumatic stress disorder)    [place]  . Rectal bleeding     Past Surgical History:  Procedure Laterality Date  . APPLICATION OF ROBOTIC ASSISTANCE FOR SPINAL PROCEDURE N/A 03/28/2018   Procedure: APPLICATION OF ROBOTIC ASSISTANCE FOR SPINAL PROCEDURE;  Surgeon: Kristeen Miss, MD;  Location: [facility];  Service: Neurosurgery;  Laterality: N/A;  . BACK SURGERY  02/14/12   lumbar OR #7; "today redid L1L2; replaced screws; added bone from hip"  . BILATERAL KNEE ARTHROSCOPY    . COLONOSCOPY  10/15/2008   Dr. [name]: tubular adenoma   . COLONOSCOPY  12/17/2003   [facility]:WCBJSE rectal and colon  . COLONOSCOPY N/A 09/05/2014   Procedure: COLONOSCOPY;  Surgeon: Daneil Dolin, MD;  [facility];  Service: Endoscopy;  Laterality: N/A;  7:30-rescheduled 9/17 to [place] notified pt  . CYSTOSCOPY WITH STENT PLACEMENT Right 01/27/2016   Procedure: CYSTOSCOPY WITH STENT PLACEMENT;  Surgeon: [name], MD;  [facility];  Service: Urology;  Laterality: Right;  . CYSTOSCOPY/RETROGRADE/URETEROSCOPY/STONE EXTRACTION WITH BASKET Right 01/27/2016   Procedure: CYSTOSCOPY, RIGHT RETROGRADE, RIGHT URETEROSCOPY, STONE EXTRACTION ;  Surgeon: [name], MD;  [facility];  Service: Urology;  Laterality: Right;  . ESOPHAGOGASTRODUODENOSCOPY  10/15/2008     Dr Rourk:Schatzki's ring status post dilation and disruption via 64 F Maloney dilator/ otherwise unremarkable esophagus, small hiatal hernia, multiple fundal gland polyps not manipulated, gastritis, negative H.pylori  . ESOPHAGOGASTRODUODENOSCOPY  06/21/02   [facility]:DVVOH sliding hiatal hernia with mild changes of reflux esophagitis limited to gastroesophageal junction.  Noncritical ring at distal esophagus, 3 cm proximal  to gastroesophageal junction/Antral gastritis  . [place]   "broke face playing softball"  . FRACTURE SURGERY     "left wrist; broke it; took spur off"  . HOLMIUM LASER APPLICATION Right 02/18/5175   Procedure: HOLMIUM LASER APPLICATION;  Surgeon: [name], MD;  [facility];  Service: Urology;  Laterality: Right;  . KNEE ARTHROSCOPY Right 05/18/2018   Procedure: PARTIAL MEDIAL MENISECTOMY AND SURGICAL LAVAGE AND CHONDROPLASTY;  Surgeon: [name], MD;  Location: [facility];   Service: Orthopedics;  Laterality: Right;  . LEFT HEART CATHETERIZATION WITH CORONARY ANGIOGRAM N/A 12/27/2013   Procedure: LEFT HEART CATHETERIZATION WITH CORONARY ANGIOGRAM;  Surgeon: [name], MD;  Location[facility] C[facility] LAB;  Service: Cardiovascular;  Laterality: N/A;  . neck epidural    . POSTERIOR LUMBAR FUSION 4 LEVEL N/A 03/28/2018   Procedure: Thoracic eight -Lumbar two- FIXATION WITH SCREW PLACEMENT, DECOMPRESSION Thoracic ten-Thoracic eleven  FOR OSTEOMYELITIS;  Surgeon: Kristeen Miss, MD;  Location: [facility];  Service: Neurosurgery;  Laterality: N/A;  . SHOULDER SURGERY Bilateral   . TEE WITHOUT CARDIOVERSION N/A 03/21/2018   Procedure: TRANSESOPHAGEAL ECHOCARDIOGRAM (TEE) WITH PROPOFOL;  Surgeon: [name], MD;  [facility];  Service: Cardiovascular;  Laterality: N/A;    There were no vitals filed for this visit.  Subjective Assessment - 09/11/18 1116    Subjective  Pt states he's not feeling well today.  STates he almost called in today.  Request to do NWB actvities.  4/10 in knees.   STates he also has had a bad cough.     Currently in Pain?  Yes    Pain Score  4     Pain Location  Knee                       [facility] Adult PT Treatment/Exercise - 09/11/18 0001      Knee/Hip Exercises: Seated   Long Arc Quad  Both;2 sets;10 reps    Long Arc Quad Weight  2 lbs.    Long Arc Quad Limitations  2# 3'' holds throughout    Marching  Both;10 reps;Limitations    Marching Limitations  Holding with 2# ankle weight      Knee/Hip Exercises: Supine   Bridges  15 reps    Other Supine Knee/Hip Exercises  clams 10x each      Knee/Hip Exercises: Sidelying   Hip ABduction  5 reps;AAROM    Clams  10 reps each with 3" holds      Knee/Hip Exercises: Prone   Hamstring Curl  10 reps;Limitations    Hamstring Curl Limitations  cues and AA with eccentric control    Hip Extension  5 reps;Limitations    Hip Extension Limitations  with knee bent and AAROM                PT Short Term Goals - 09/01/18 1119      PT SHORT TERM GOAL #1   Title  Patient will report understanding and regular compliance with HEP to improve strength and overall functional mobility.     Time  4    Period  Weeks    Status  Partially Met      PT SHORT TERM GOAL #2   Title  Patient will demonstrate improvement of 1/2 MMT strength grade in all muscle groups tested to assist patient with safe transfers, ambulation, and standing.     Time  4  Period  Weeks    Status  On-going      PT SHORT TERM GOAL #3   Title  Patient will report or demonstrate ability to perform ambulation for 2 minutes with LRAD with no more than supervision assistance without rest break and reported no more than minimal difficulty.     Time  4    Period  Weeks    Status  Partially Met        PT Long Term Goals - 09/01/18 1119      PT LONG TERM GOAL #1   Title  Patient will report or demonstrate ability to perform ambulation for 5 minutes with LRAD with no more than supervision assistance without rest break and reported no more than minimal difficulty.     Time  8    Period  Weeks    Status  On-going      PT LONG TERM GOAL #2   Title  Patient will report ability to stand at a support surface for 10 minutes without a rest break in order to perform household chores with increased ease.     Time  8    Period  Weeks    Status  On-going      PT LONG TERM GOAL #3   Title  Patient will demonstrate an improvement of 10% on FOTO indicating improvement in percieved functional mobility.     Time  8    Period  Weeks    Status  On-going            Plan - 09/11/18 1308    Clinical Impression Statement  Held WB actvities today due to increased pain and patient request.  Completed seated and mat exercises today.  PT with extreme weakness in hips and knees. Poor eccentric control and proprioceptive awareness with all LE mat exercises today.  Verbal and tactile cues to complete in  correct form and control.      Rehab Potential  Fair    Clinical Impairments Affecting Rehab Potential  Positive: Highly motivated; Negative: Comorbidities    PT Frequency  3x / week    PT Duration  8 weeks    PT Treatment/Interventions  ADLs/Self Care Home Management;Aquatic Therapy;DME Instruction;Gait training;Functional mobility training;Stair training;Therapeutic activities;Therapeutic exercise;Balance training;Neuromuscular re-education;Patient/family education;Orthotic Fit/Training;Wheelchair mobility training;Manual techniques;Passive range of motion;Energy conservation;Dry needling;Splinting;Taping    PT Next Visit Plan  Progress standing and gait tolerance, monitor patient's blood pressure as needed. Adhere to BLT precautions until follow-up with physician. Continue BWSTT/over ground gait training. Work on ambulation, transfers, and functional lower extremity strengthening. Continue with gait training in aquatic setting and LE strengthening.    PT Home Exercise Plan  08/16/18:  seated DF/PF, LAQ, marching, abd with theraband and bridge       Patient will benefit from skilled therapeutic intervention in order to improve the following deficits and impairments:  Abnormal gait, Decreased balance, Decreased endurance, Decreased mobility, Difficulty walking, Hypomobility, Decreased range of motion, Decreased activity tolerance, Decreased strength, Impaired flexibility  Visit Diagnosis: Other abnormalities of gait and mobility  Other symptoms and signs involving the musculoskeletal system  Muscle weakness (generalized)     Problem List Patient Active Problem List   Diagnosis Date Noted  . Pseudogout of knee   . Pyogenic arthritis of right knee joint ([place])   . Falls frequently 05/08/2018  . Fall   . Sleep disturbance   . Post-op pain   . Hypokalemia   . Morbid obesity ([place])   .  Postoperative pain   . DVT, lower extremity, distal, acute, bilateral ([place])   . Acute blood loss anemia    . Anemia of chronic disease   . Essential hypertension   . Bacteremia   . Myelopathy ([place]) 03/30/2018  . Paraplegia ([place])   . Postlaminectomy syndrome, lumbar region   . Epidural abscess   . Abdominal distension   . Encephalopathy   . Weakness of both lower extremities   . Discitis of thoracic region   . Spinal cord stimulator status   . Staphylococcus aureus sepsis ([place]) 03/20/2018  . Bacteremia due to methicillin susceptible Staphylococcus aureus (MSSA) 03/18/2018  . Obesity 03/18/2018  . Nephrolithiasis 03/16/2018  . AKI (acute kidney injury) ([place]) 03/16/2018  . Cognitive impairment 03/16/2018  . Chronic pain syndrome 03/16/2018  . B12 deficiency 01/28/2016  . Memory difficulty 07/23/2015  . History of cerebrovascular disease 07/23/2015  . FH: colon cancer 08/13/2014  . Hx of adenomatous colonic polyps 08/13/2014  . Chronic back pain 05/30/2012  . CAD, NATIVE VESSEL 07/17/2010  . HYPERCHOLESTEROLEMIA 10/31/2009  . SMOKELESS TOBACCO ABUSE 10/31/2009  . Obstructive sleep apnea 10/31/2009  . Essential hypertension, benign 10/31/2009  . GERD 10/31/2009   Teena Irani, PTA/CLT [phone]  Teena Irani 09/11/2018, 1:43 PM  [place] [address] [place], [place], [address] Phone: [phone]   Fax:  [phone]  Name: DAELIN HASTE MRN: [id number] Date of Birth: Jun 16, 1946

## 2018-09-13 ENCOUNTER — Ambulatory Visit (HOSPITAL_COMMUNITY): Payer: Medicare Other | Admitting: Physical Therapy

## 2018-09-13 ENCOUNTER — Encounter (HOSPITAL_COMMUNITY): Payer: Self-pay | Admitting: Physical Therapy

## 2018-09-13 DIAGNOSIS — R2689 Other abnormalities of gait and mobility: Secondary | ICD-10-CM | POA: Diagnosis not present

## 2018-09-13 DIAGNOSIS — R29898 Other symptoms and signs involving the musculoskeletal system: Secondary | ICD-10-CM

## 2018-09-13 DIAGNOSIS — M6281 Muscle weakness (generalized): Secondary | ICD-10-CM

## 2018-09-13 NOTE — Therapy (Addendum)
[place], [place], [address] Phone: [phone]   Fax:  [phone]  Physical Therapy Treatment / Re-assessment  Patient Details  Name: Cody Velez MRN: [id number] Date of Birth: [birth year] Referring Provider: Asencion Noble, MD   Encounter Date: 09/13/2018   Progress Note Reporting Period 09/08/18 to 09/13/18  See note below for Objective Data and Assessment of Progress/Goals.       PT End of Session - 09/13/18 1124    Visit Number  12    Number of Visits  25    Date for PT Re-Evaluation  10/13/18   MInireassess 09/12/18   Authorization Type  Primary: Medicare; Secondary: BC[employer] (75 visits per calendar year with 14 used at evaluation)    Authorization Time Period  08/15/18 - 10/14/18    Authorization - Visit Number  30    Authorization - Number of Visits  34    PT Start Time  1116    PT Stop Time  1205    PT Time Calculation (min)  49 min    Equipment Utilized During Treatment  Gait belt    Activity Tolerance  Patient limited by fatigue;Patient tolerated treatment well    Behavior During Therapy  WFL for tasks assessed/performed       Past Medical History:  Diagnosis Date  . Allergic rhinitis   . Arthritis   . B12 deficiency   . Cervicogenic headache 01/28/2016  . Childhood asthma   . Chronic back pain   . Coronary atherosclerosis of native coronary artery    Mild nonobstructive CAD at catheterization January 2015  . Depression   . Essential hypertension   . Falls   . GERD (gastroesophageal reflux disease)   . History of cerebrovascular disease 07/23/2015  . History of pneumonia 02/2011  . Hyperlipidemia   . Kidney stone   . Memory difficulty 07/23/2015  . OSA (obstructive sleep apnea)    CPAP - Dr. [name]  . Prostate cancer ([place])   . PTSD (post-traumatic stress disorder)    [place]  . Rectal bleeding     Past Surgical History:  Procedure Laterality Date  . APPLICATION OF ROBOTIC ASSISTANCE  FOR SPINAL PROCEDURE N/A 03/28/2018   Procedure: APPLICATION OF ROBOTIC ASSISTANCE FOR SPINAL PROCEDURE;  Surgeon: Kristeen Miss, MD;  Location: [place];  Service: Neurosurgery;  Laterality: N/A;  . BACK SURGERY  02/14/12   lumbar OR #7; "today redid L1L2; replaced screws; added bone from hip"  . BILATERAL KNEE ARTHROSCOPY    . COLONOSCOPY  10/15/2008   Dr. [name]: tubular adenoma   . COLONOSCOPY  12/17/2003   [facility]:JSEGBT rectal and colon  . COLONOSCOPY N/A 09/05/2014   Procedure: COLONOSCOPY;  Surgeon: Daneil Dolin, MD;  [facility];  Service: Endoscopy;  Laterality: N/A;  7:30-rescheduled 9/17 to [place] notified pt  . CYSTOSCOPY WITH STENT PLACEMENT Right 01/27/2016   Procedure: CYSTOSCOPY WITH STENT PLACEMENT;  Surgeon: [name], MD;  Location: [facility];  Service: Urology;  Laterality: Right;  . CYSTOSCOPY/RETROGRADE/URETEROSCOPY/STONE EXTRACTION WITH BASKET Right 01/27/2016   Procedure: CYSTOSCOPY, RIGHT RETROGRADE, RIGHT URETEROSCOPY, STONE EXTRACTION ;  Surgeon: [name], MD;  Location: [facility];  Service: Urology;  Laterality: Right;  . ESOPHAGOGASTRODUODENOSCOPY  10/15/2008     Dr Rourk:Schatzki's ring status post dilation and disruption via 9 F Maloney dilator/ otherwise unremarkable esophagus, small hiatal hernia, multiple fundal gland polyps not manipulated, gastritis, negative H.pylori  . ESOPHAGOGASTRODUODENOSCOPY  06/21/02  [facility]:PHXTA sliding hiatal hernia with mild changes of reflux esophagitis limited to gastroesophageal junction.  Noncritical ring at distal esophagus, 3 cm proximal to gastroesophageal junction/Antral gastritis  . [place]   "broke face playing softball"  . FRACTURE SURGERY     "left wrist; broke it; took spur off"  . HOLMIUM LASER APPLICATION Right 04/24/9793   Procedure: HOLMIUM LASER APPLICATION;  Surgeon: [name], MD;  Location: [facility];  Service: Urology;  Laterality: Right;  . KNEE ARTHROSCOPY Right  05/18/2018   Procedure: PARTIAL MEDIAL MENISECTOMY AND SURGICAL LAVAGE AND CHONDROPLASTY;  Surgeon: [name], MD;  Location: [facility];  Service: Orthopedics;  Laterality: Right;  . LEFT HEART CATHETERIZATION WITH CORONARY ANGIOGRAM N/A 12/27/2013   Procedure: LEFT HEART CATHETERIZATION WITH CORONARY ANGIOGRAM;  Surgeon: [name], MD;  Location: [facility];  Service: Cardiovascular;  Laterality: N/A;  . neck epidural    . POSTERIOR LUMBAR FUSION 4 LEVEL N/A 03/28/2018   Procedure: Thoracic eight -Lumbar two- FIXATION WITH SCREW PLACEMENT, DECOMPRESSION Thoracic ten-Thoracic eleven  FOR OSTEOMYELITIS;  Surgeon: Kristeen Miss, MD;  Location: [facility];  Service: Neurosurgery;  Laterality: N/A;  . SHOULDER SURGERY Bilateral   . TEE WITHOUT CARDIOVERSION N/A 03/21/2018   Procedure: TRANSESOPHAGEAL ECHOCARDIOGRAM (TEE) WITH PROPOFOL;  Surgeon: [name], MD;  [facility];  Service: Cardiovascular;  Laterality: N/A;    There were no vitals filed for this visit.  Subjective Assessment - 09/13/18 1120    Subjective  Patient stated he has not been doing his exercises at home. He stated he has had low back pain of 4/10 stated he thinks it was when he was getting out of the car.     Currently in Pain?  Yes    Pain Score  4     Pain Location  Back    Pain Orientation  Lower;Right    Pain Descriptors / Indicators  Aching    Pain Type  Chronic pain         [facility] PT Assessment - 09/13/18 0001      Assessment   Medical Diagnosis  T10-11 Epidural Abscess "Paraplegia"    Referring Provider  Asencion Noble, MD    Prior Therapy  Yes, inpatient rehab and home health which patient reported ended 08/04/18      [place] residence    Living Arrangements  Spouse/significant other    Type of [place] entrance    [place] to live on main level with bedroom/bathroom    [place] - 2 wheels;Walker - 4  wheels;Wheelchair - manual      Prior Function   Level of Independence  Independent;Independent with basic ADLs      Cognition   Overall Cognitive Status  Within Functional Limits for tasks assessed      Observation/Other Assessments   Focus on Therapeutic Outcomes (FOTO)   46% (54% limited)   was 46%     Strength   Right Hip Flexion  4/5   was 4   Right Hip Extension  2+/5   was 2+   Right Hip ABduction  3-/5   was 3   Left Hip Flexion  3-/5   was 4   Left Hip Extension  2+/5   was 2+   Left Hip ABduction  3-/5   was 3   Right Knee Flexion  4+/5   4   Right Knee Extension  4/5   partial range was 3+   Left Knee Flexion  4+/5   was 4   Left Knee Extension  4+/5   partial range was 4   Right Ankle Dorsiflexion  4+/5   was 4+   Left Ankle Dorsiflexion  4+/5   was 4+                  [facility] Adult PT Treatment/Exercise - 09/13/18 0001      Ambulation/Gait   Ambulation/Gait  Yes    Ambulation/Gait Assistance  4: Min assist    Ambulation/Gait Assistance Details  Wheelchair follow and therapist guarding at gait belt and verbal cues for footplacement    Ambulation Distance (Feet)  32 Feet    Assistive device  Rolling walker    Gait Pattern  Decreased dorsiflexion - left;Decreased dorsiflexion - right;Step-to pattern;Trunk flexed;Decreased step length - right;Decreased step length - left      Knee/Hip Exercises: Seated   Long Arc Quad  Both;2 sets;10 reps    Long Arc Quad Weight  2 lbs.    Long Arc Quad Limitations  2# 3'' holds throughout    Marching  Both;10 reps;Limitations    Marching Limitations  Holding with 2# ankle weight    Abduction/Adduction   Both;Limitations    Abd/Adduction Limitations  --   Volley ball between knees 3 x 10 each     Knee/Hip Exercises: Supine   Bridges  15 reps    Other Supine Knee/Hip Exercises  clams 10x each               PT Short Term Goals - 09/13/18 1242      PT SHORT TERM GOAL #1   Title  Patient will  report understanding and regular compliance with HEP to improve strength and overall functional mobility.     Baseline  09/13/18: Patient stated he has not been doing his exercises at home as often as he should.     Time  4    Period  Weeks    Status  Partially Met      PT SHORT TERM GOAL #2   Title  Patient will demonstrate improvement of 1/2 MMT strength grade in all muscle groups tested to assist patient with safe transfers, ambulation, and standing.     Baseline  09/13/18: Patient demonstrated improvement in some, but not all muscle groups tested    Time  4    Period  Weeks    Status  Partially Met      PT SHORT TERM GOAL #3   Title  Patient will report or demonstrate ability to perform ambulation for 2 minutes with LRAD with no more than supervision assistance without rest break and reported no more than minimal difficulty.     Baseline  09/13/18: Patient is able to ambulate for about 1 minute before requiring a rest break with minimal assistance and RW.      Time  4    Period  Weeks    Status  On-going      PT SHORT TERM GOAL #4   Title  Patient will demonstrate ability to ambulate 60 feet without rest break using LRAD with no more than minimal assistance.    Time  2    Period  Weeks    Status  New    Target Date  09/27/18        PT Long Term Goals -  09/13/18 1245      PT LONG TERM GOAL #1   Title  Patient will report or demonstrate ability to perform ambulation for 5 minutes with LRAD with no more than supervision assistance without rest break and reported no more than minimal difficulty.     Baseline  09/13/18: Patient is able to ambulate for about 1 minute before requiring a rest break with minimal assistance and RW.      Time  8    Period  Weeks    Status  On-going      PT LONG TERM GOAL #2   Title  Patient will report ability to stand at a support surface for 10 minutes without a rest break in order to perform household chores with increased ease.     Baseline   09/13/18: Patient reported ability to stand for 2-3 minutes.     Time  8    Period  Weeks    Status  On-going      PT LONG TERM GOAL #3   Title  Patient will demonstrate ability to ambulate 120 feet without rest break using LRAD with no more than minimal assistance.    Baseline  Old goal: Patient will demonstrate an improvement of 10% on FOTO indicating improvement in percieved functional mobility. 09/13/18: Patient's FOTO score was found to be 46%.     Time  4    Period  Weeks    Status  Revised    Target Date  10/11/18            Plan - 09/13/18 1259    Clinical Impression Statement  This session performed a re-assessment of patient's progress towards goals. Patient partially met 2 out of 3 short term goals. An additional short term goal was added to address the distance patient will ambulate with assistance. Patient's progress towards all long term goals is on-going. One goal was revised to capture patient's ability to ambulate a longer distance. Patient has demonstrated improvements in ability to ambulate further distances and is now able to ambulate 32 feet at this session. Patient also reported feeling that he has gotten stronger. Therapist explained to patient the importance of continuing HEP at home on his progress. Patient reported understanding and plans to perform HEP more consistently. Patient would benefit from continued skilled physical therapy in order to progress patient's strength, endurance, improve gait, and overall functional mobility.     Rehab Potential  Fair    Clinical Impairments Affecting Rehab Potential  Positive: Highly motivated; Negative: Comorbidities    PT Frequency  3x / week    PT Duration  8 weeks    PT Treatment/Interventions  ADLs/Self Care Home Management;Aquatic Therapy;DME Instruction;Gait training;Functional mobility training;Stair training;Therapeutic activities;Therapeutic exercise;Balance training;Neuromuscular re-education;Patient/family  education;Orthotic Fit/Training;Wheelchair mobility training;Manual techniques;Passive range of motion;Energy conservation;Dry needling;Splinting;Taping    PT Next Visit Plan  Progress standing and gait tolerance, monitor patient's blood pressure as needed. Adhere to BLT precautions until follow-up with physician. Continue BWSTT/over ground gait training. Work on ambulation, transfers, and functional lower extremity strengthening. Continue with gait training in aquatic setting and LE strengthening.    PT Home Exercise Plan  08/16/18:  seated DF/PF, LAQ, marching, abd with theraband and bridge       Patient will benefit from skilled therapeutic intervention in order to improve the following deficits and impairments:  Abnormal gait, Decreased balance, Decreased endurance, Decreased mobility, Difficulty walking, Hypomobility, Decreased range of motion, Decreased activity tolerance, Decreased strength, Impaired flexibility  Visit Diagnosis:  Other abnormalities of gait and mobility  Other symptoms and signs involving the musculoskeletal system  Muscle weakness (generalized)     Problem List Patient Active Problem List   Diagnosis Date Noted  . Pseudogout of knee   . Pyogenic arthritis of right knee joint ([place])   . Falls frequently 05/08/2018  . Fall   . Sleep disturbance   . Post-op pain   . Hypokalemia   . Morbid obesity ([place])   . Postoperative pain   . DVT, lower extremity, distal, acute, bilateral ([place])   . Acute blood loss anemia   . Anemia of chronic disease   . Essential hypertension   . Bacteremia   . Myelopathy ([place]) 03/30/2018  . Paraplegia ([place])   . Postlaminectomy syndrome, lumbar region   . Epidural abscess   . Abdominal distension   . Encephalopathy   . Weakness of both lower extremities   . Discitis of thoracic region   . Spinal cord stimulator status   . Staphylococcus aureus sepsis ([place]) 03/20/2018  . Bacteremia due to methicillin susceptible Staphylococcus  aureus (MSSA) 03/18/2018  . Obesity 03/18/2018  . Nephrolithiasis 03/16/2018  . AKI (acute kidney injury) ([place]) 03/16/2018  . Cognitive impairment 03/16/2018  . Chronic pain syndrome 03/16/2018  . B12 deficiency 01/28/2016  . Memory difficulty 07/23/2015  . History of cerebrovascular disease 07/23/2015  . FH: colon cancer 08/13/2014  . Hx of adenomatous colonic polyps 08/13/2014  . Chronic back pain 05/30/2012  . CAD, NATIVE VESSEL 07/17/2010  . HYPERCHOLESTEROLEMIA 10/31/2009  . SMOKELESS TOBACCO ABUSE 10/31/2009  . Obstructive sleep apnea 10/31/2009  . Essential hypertension, benign 10/31/2009  . GERD 10/31/2009   Clarene Critchley PT, DPT 1:02 PM, 09/13/18 [place], [place], [address] Phone: [phone]   Fax:  [phone]  Name: DERIK FULTS MRN: [id number] Date of Birth: 06/16/1946

## 2018-09-14 DIAGNOSIS — G061 Intraspinal abscess and granuloma: Secondary | ICD-10-CM | POA: Diagnosis not present

## 2018-09-14 DIAGNOSIS — R030 Elevated blood-pressure reading, without diagnosis of hypertension: Secondary | ICD-10-CM | POA: Diagnosis not present

## 2018-09-15 ENCOUNTER — Ambulatory Visit (HOSPITAL_COMMUNITY): Payer: Medicare Other | Admitting: Physical Therapy

## 2018-09-15 ENCOUNTER — Encounter (HOSPITAL_COMMUNITY): Payer: Self-pay | Admitting: Physical Therapy

## 2018-09-15 DIAGNOSIS — R2689 Other abnormalities of gait and mobility: Secondary | ICD-10-CM | POA: Diagnosis not present

## 2018-09-15 DIAGNOSIS — M6281 Muscle weakness (generalized): Secondary | ICD-10-CM

## 2018-09-15 DIAGNOSIS — R29898 Other symptoms and signs involving the musculoskeletal system: Secondary | ICD-10-CM | POA: Diagnosis not present

## 2018-09-15 NOTE — Therapy (Signed)
[place], [place], [address] Phone: [phone]   Fax:  [phone]  Physical Therapy Treatment  Patient Details  Name: Cody Velez MRN: [id number] Date of Birth: Dec 28, 1945 Referring Provider (PT): Asencion Noble, MD   Encounter Date: 09/15/2018  PT End of Session - 09/15/18 0906    Visit Number  13    Number of Visits  25    Date for PT Re-Evaluation  10/13/18   MInireassess 09/12/18   Authorization Type  Primary: Medicare; Secondary: BCBS (1 visits per calendar year with 14 used at evaluation)    Authorization Time Period  08/15/18 - 10/14/18    Authorization - Visit Number  18   KX   Authorization - Number of Visits  89    PT Start Time  4270    PT Stop Time  0939    PT Time Calculation (min)  42 min    Equipment Utilized During Treatment  Gait belt    Activity Tolerance  Patient limited by fatigue;Patient tolerated treatment well    Behavior During Therapy  [facility] for tasks assessed/performed       Past Medical History:  Diagnosis Date  . Allergic rhinitis   . Arthritis   . B12 deficiency   . Cervicogenic headache 01/28/2016  . Childhood asthma   . Chronic back pain   . Coronary atherosclerosis of native coronary artery    Mild nonobstructive CAD at catheterization January 2015  . Depression   . Essential hypertension   . Falls   . GERD (gastroesophageal reflux disease)   . History of cerebrovascular disease 07/23/2015  . History of pneumonia 02/2011  . Hyperlipidemia   . Kidney stone   . Memory difficulty 07/23/2015  . OSA (obstructive sleep apnea)    CPAP - Dr. [name]  . Prostate cancer ([place])   . PTSD (post-traumatic stress disorder)    [place]  . Rectal bleeding     Past Surgical History:  Procedure Laterality Date  . APPLICATION OF ROBOTIC ASSISTANCE FOR SPINAL PROCEDURE N/A 03/28/2018   Procedure: APPLICATION OF ROBOTIC ASSISTANCE FOR SPINAL PROCEDURE;  Surgeon: Kristeen Miss, MD;  Location:  [place];  Service: Neurosurgery;  Laterality: N/A;  . BACK SURGERY  02/14/12   lumbar OR #7; "today redid L1L2; replaced screws; added bone from hip"  . BILATERAL KNEE ARTHROSCOPY    . COLONOSCOPY  10/15/2008   Dr. [name]: tubular adenoma   . COLONOSCOPY  12/17/2003   WCB:JSEGBT rectal and colon  . COLONOSCOPY N/A 09/05/2014   Procedure: COLONOSCOPY;  Surgeon: Daneil Dolin, MD;  [facility];  Service: Endoscopy;  Laterality: N/A;  7:30-rescheduled 9/17 to [place] notified pt  . CYSTOSCOPY WITH STENT PLACEMENT Right 01/27/2016   Procedure: CYSTOSCOPY WITH STENT PLACEMENT;  Surgeon: [name], MD;  Location: [facility];  Service: Urology;  Laterality: Right;  . CYSTOSCOPY/RETROGRADE/URETEROSCOPY/STONE EXTRACTION WITH BASKET Right 01/27/2016   Procedure: CYSTOSCOPY, RIGHT RETROGRADE, RIGHT URETEROSCOPY, STONE EXTRACTION ;  Surgeon: [name], MD;  Location: [facility];  Service: Urology;  Laterality: Right;  . ESOPHAGOGASTRODUODENOSCOPY  10/15/2008     Dr Rourk:Schatzki's ring status post dilation and disruption via 5 F Maloney dilator/ otherwise unremarkable esophagus, small hiatal hernia, multiple fundal gland polyps not manipulated, gastritis, negative H.pylori  . ESOPHAGOGASTRODUODENOSCOPY  06/21/02   DVV:OHYWV sliding hiatal hernia with mild changes of reflux esophagitis limited to gastroesophageal junction.  Noncritical ring at distal esophagus, 3 cm proximal  to gastroesophageal junction/Antral gastritis  . [place]   "broke face playing softball"  . FRACTURE SURGERY     "left wrist; broke it; took spur off"  . HOLMIUM LASER APPLICATION Right 4/0/9811   Procedure: HOLMIUM LASER APPLICATION;  Surgeon: [name], MD;  Location: [facility];  Service: Urology;  Laterality: Right;  . KNEE ARTHROSCOPY Right 05/18/2018   Procedure: PARTIAL MEDIAL MENISECTOMY AND SURGICAL LAVAGE AND CHONDROPLASTY;  Surgeon: [name], MD;  Location: [place];   Service: Orthopedics;  Laterality: Right;  . LEFT HEART CATHETERIZATION WITH CORONARY ANGIOGRAM N/A 12/27/2013   Procedure: LEFT HEART CATHETERIZATION WITH CORONARY ANGIOGRAM;  Surgeon: [name], MD;  Location: [facility] C[facility];  Service: Cardiovascular;  Laterality: N/A;  . neck epidural    . POSTERIOR LUMBAR FUSION 4 LEVEL N/A 03/28/2018   Procedure: Thoracic eight -Lumbar two- FIXATION WITH SCREW PLACEMENT, DECOMPRESSION Thoracic ten-Thoracic eleven  FOR OSTEOMYELITIS;  Surgeon: Kristeen Miss, MD;  Location: [place];  Service: Neurosurgery;  Laterality: N/A;  . SHOULDER SURGERY Bilateral   . TEE WITHOUT CARDIOVERSION N/A 03/21/2018   Procedure: TRANSESOPHAGEAL ECHOCARDIOGRAM (TEE) WITH PROPOFOL;  Surgeon: [name], MD;  [facility];  Service: Cardiovascular;  Laterality: N/A;    There were no vitals filed for this visit.  Subjective Assessment - 09/15/18 0904    Subjective  Patient denied any pain this session. He stated that he has not been doing his exercises.     Currently in Pain?  No/denies                       [facility] Adult PT Treatment/Exercise - 09/15/18 0001      Ambulation/Gait   Ambulation/Gait  Yes    Ambulation/Gait Assistance  4: Min assist    Ambulation/Gait Assistance Details  Wheelchair follow and gaurding at gait belt and verbal cues for foot placement    Ambulation Distance (Feet)  34 Feet    Assistive device  Rolling walker    Gait Pattern  Decreased dorsiflexion - left;Decreased dorsiflexion - right;Step-to pattern;Trunk flexed;Decreased step length - right;Decreased step length - left      Knee/Hip Exercises: Standing   Other Standing Knee Exercises  Forward tapping towards collored cones inside parallel bars with bilateral UE support and therapist guarding with wheelchair behind 1 minute x 2 repetitions for improved proprioception      Knee/Hip Exercises: Seated   Long Arc Quad  Both;2 sets;10 reps    Long Arc Quad Weight  2  lbs.    Long Arc Quad Limitations  2# 3'' holds throughout    Other Seated Knee/Hip Exercises  Marching on dyna disc x 20    Other Seated Knee/Hip Exercises  Seated on dyna disc catching scarves x 4 minutes to challenge abdominal and trunk strength    Marching  Both;10 reps;Limitations    Marching Limitations  Holding with 2# ankle weight             PT Education - 09/15/18 0905    Education Details  Discussed purpose and technique of interventions throughout session.     Person(s) Educated  Patient    Methods  Explanation    Comprehension  Verbalized understanding       PT Short Term Goals - 09/13/18 1242      PT SHORT TERM GOAL #1   Title  Patient will report understanding and regular compliance with HEP to improve strength and  overall functional mobility.     Baseline  09/13/18: Patient stated he has not been doing his exercises at home as often as he should.     Time  4    Period  Weeks    Status  Partially Met      PT SHORT TERM GOAL #2   Title  Patient will demonstrate improvement of 1/2 MMT strength grade in all muscle groups tested to assist patient with safe transfers, ambulation, and standing.     Baseline  09/13/18: Patient demonstrated improvement in some, but not all muscle groups tested    Time  4    Period  Weeks    Status  Partially Met      PT SHORT TERM GOAL #3   Title  Patient will report or demonstrate ability to perform ambulation for 2 minutes with LRAD with no more than supervision assistance without rest break and reported no more than minimal difficulty.     Baseline  09/13/18: Patient is able to ambulate for about 1 minute before requiring a rest break with minimal assistance and RW.      Time  4    Period  Weeks    Status  On-going      PT SHORT TERM GOAL #4   Title  Patient will demonstrate ability to ambulate 60 feet without rest break using LRAD with no more than minimal assistance.    Time  2    Period  Weeks    Status  New    Target Date   09/27/18        PT Long Term Goals - 09/13/18 1245      PT LONG TERM GOAL #1   Title  Patient will report or demonstrate ability to perform ambulation for 5 minutes with LRAD with no more than supervision assistance without rest break and reported no more than minimal difficulty.     Baseline  09/13/18: Patient is able to ambulate for about 1 minute before requiring a rest break with minimal assistance and RW.      Time  8    Period  Weeks    Status  On-going      PT LONG TERM GOAL #2   Title  Patient will report ability to stand at a support surface for 10 minutes without a rest break in order to perform household chores with increased ease.     Baseline  09/13/18: Patient reported ability to stand for 2-3 minutes.     Time  8    Period  Weeks    Status  On-going      PT LONG TERM GOAL #3   Title  Patient will demonstrate ability to ambulate 120 feet without rest break using LRAD with no more than minimal assistance.    Baseline  Old goal: Patient will demonstrate an improvement of 10% on FOTO indicating improvement in percieved functional mobility. 09/13/18: Patient's FOTO score was found to be 46%.     Time  4    Period  Weeks    Status  Revised    Target Date  10/11/18            Plan - 09/15/18 0945    Clinical Impression Statement  This session began with exercises to improve trunk strength seated at edge of mat. Added seated on dyna disc with reaching for scarves outside of patient's BOS. This session also had patient standing and tapping foot forward toward a colored cone  to challenge patient's precision of foot placement. Ended session with ambulation 34 feet with RW and wheelchair follow. Patient would benefit from continued skilled physical therapy in order to continue progressing towards functional goals.     Rehab Potential  Fair    Clinical Impairments Affecting Rehab Potential  Positive: Highly motivated; Negative: Comorbidities    PT Frequency  3x / week    PT  Duration  8 weeks    PT Treatment/Interventions  ADLs/Self Care Home Management;Aquatic Therapy;DME Instruction;Gait training;Functional mobility training;Stair training;Therapeutic activities;Therapeutic exercise;Balance training;Neuromuscular re-education;Patient/family education;Orthotic Fit/Training;Wheelchair mobility training;Manual techniques;Passive range of motion;Energy conservation;Dry needling;Splinting;Taping    PT Next Visit Plan  Progress standing and gait tolerance, monitor patient's blood pressure as needed. Adhere to BLT precautions until follow-up with physician. Continue BWSTT/over ground gait training. Work on ambulation, transfers, and functional lower extremity strengthening. Continue with gait training in aquatic setting and LE strengthening.    PT Home Exercise Plan  08/16/18:  seated DF/PF, LAQ, marching, abd with theraband and bridge       Patient will benefit from skilled therapeutic intervention in order to improve the following deficits and impairments:  Abnormal gait, Decreased balance, Decreased endurance, Decreased mobility, Difficulty walking, Hypomobility, Decreased range of motion, Decreased activity tolerance, Decreased strength, Impaired flexibility  Visit Diagnosis: Other abnormalities of gait and mobility  Other symptoms and signs involving the musculoskeletal system  Muscle weakness (generalized)     Problem List Patient Active Problem List   Diagnosis Date Noted  . Pseudogout of knee   . Pyogenic arthritis of right knee joint ([place])   . Falls frequently 05/08/2018  . Fall   . Sleep disturbance   . Post-op pain   . Hypokalemia   . Morbid obesity ([place])   . Postoperative pain   . DVT, lower extremity, distal, acute, bilateral ([place])   . Acute blood loss anemia   . Anemia of chronic disease   . Essential hypertension   . Bacteremia   . Myelopathy ([place]) 03/30/2018  . Paraplegia ([place])   . Postlaminectomy syndrome, lumbar region   . Epidural  abscess   . Abdominal distension   . Encephalopathy   . Weakness of both lower extremities   . Discitis of thoracic region   . Spinal cord stimulator status   . Staphylococcus aureus sepsis ([place]) 03/20/2018  . Bacteremia due to methicillin susceptible Staphylococcus aureus (MSSA) 03/18/2018  . Obesity 03/18/2018  . Nephrolithiasis 03/16/2018  . AKI (acute kidney injury) ([place]) 03/16/2018  . Cognitive impairment 03/16/2018  . Chronic pain syndrome 03/16/2018  . B12 deficiency 01/28/2016  . Memory difficulty 07/23/2015  . History of cerebrovascular disease 07/23/2015  . FH: colon cancer 08/13/2014  . Hx of adenomatous colonic polyps 08/13/2014  . Chronic back pain 05/30/2012  . CAD, NATIVE VESSEL 07/17/2010  . HYPERCHOLESTEROLEMIA 10/31/2009  . SMOKELESS TOBACCO ABUSE 10/31/2009  . Obstructive sleep apnea 10/31/2009  . Essential hypertension, benign 10/31/2009  . GERD 10/31/2009   Clarene Critchley PT, DPT 9:47 AM, 09/15/18 [place], [place], [address] Phone: [phone]   Fax:  [phone]  Name: Cody Velez MRN: [id number] Date of Birth: 07-22-1946

## 2018-09-18 ENCOUNTER — Ambulatory Visit (HOSPITAL_COMMUNITY): Payer: Medicare Other

## 2018-09-18 ENCOUNTER — Encounter (HOSPITAL_COMMUNITY): Payer: Self-pay

## 2018-09-18 ENCOUNTER — Other Ambulatory Visit: Payer: Self-pay

## 2018-09-18 DIAGNOSIS — I10 Essential (primary) hypertension: Secondary | ICD-10-CM | POA: Diagnosis not present

## 2018-09-18 DIAGNOSIS — I82401 Acute embolism and thrombosis of unspecified deep veins of right lower extremity: Secondary | ICD-10-CM | POA: Diagnosis not present

## 2018-09-18 DIAGNOSIS — M6281 Muscle weakness (generalized): Secondary | ICD-10-CM

## 2018-09-18 DIAGNOSIS — R29898 Other symptoms and signs involving the musculoskeletal system: Secondary | ICD-10-CM

## 2018-09-18 DIAGNOSIS — Z23 Encounter for immunization: Secondary | ICD-10-CM | POA: Diagnosis not present

## 2018-09-18 DIAGNOSIS — R2689 Other abnormalities of gait and mobility: Secondary | ICD-10-CM | POA: Diagnosis not present

## 2018-09-18 DIAGNOSIS — M4644 Discitis, unspecified, thoracic region: Secondary | ICD-10-CM | POA: Diagnosis not present

## 2018-09-18 NOTE — Therapy (Signed)
[place], [place], [address] Phone: [phone]   Fax:  [phone]  Physical Therapy Aquatic Treatment  Patient Details  Name: Cody Velez MRN: [id number] Date of Birth: [birth year] Referring Provider (PT): Asencion Noble, MD   Encounter Date: 09/18/2018  PT End of Session - 09/18/18 1617    Visit Number  14    Number of Visits  25    Date for PT Re-Evaluation  10/13/18   MInireassess 09/12/18   Authorization Type  Primary: Medicare; Secondary: BCBS (22 visits per calendar year with 14 used at evaluation)    Authorization Time Period  08/15/18 - 10/14/18    Authorization - Visit Number  24   KX   Authorization - Number of Visits  75    PT Start Time  0623    PT Stop Time  1350    PT Time Calculation (min)  45 min    Equipment Utilized During Treatment  Gait belt    Activity Tolerance  Patient limited by fatigue;Patient tolerated treatment well    Behavior During Therapy  [facility] for tasks assessed/performed       Past Medical History:  Diagnosis Date  . Allergic rhinitis   . Arthritis   . B12 deficiency   . Cervicogenic headache 01/28/2016  . Childhood asthma   . Chronic back pain   . Coronary atherosclerosis of native coronary artery    Mild nonobstructive CAD at catheterization January 2015  . Depression   . Essential hypertension   . Falls   . GERD (gastroesophageal reflux disease)   . History of cerebrovascular disease 07/23/2015  . History of pneumonia 02/2011  . Hyperlipidemia   . Kidney stone   . Memory difficulty 07/23/2015  . OSA (obstructive sleep apnea)    CPAP - Dr. [name]  . Prostate cancer ([place])   . PTSD (post-traumatic stress disorder)    [place]  . Rectal bleeding     Past Surgical History:  Procedure Laterality Date  . APPLICATION OF ROBOTIC ASSISTANCE FOR SPINAL PROCEDURE N/A 03/28/2018   Procedure: APPLICATION OF ROBOTIC ASSISTANCE FOR SPINAL PROCEDURE;  Surgeon: Kristeen Miss, MD;   Location: [place];  Service: Neurosurgery;  Laterality: N/A;  . BACK SURGERY  02/14/12   lumbar OR #7; "today redid L1L2; replaced screws; added bone from hip"  . BILATERAL KNEE ARTHROSCOPY    . COLONOSCOPY  10/15/2008   Dr. [name]: tubular adenoma   . COLONOSCOPY  12/17/2003   JSE:GBTDVV rectal and colon  . COLONOSCOPY N/A 09/05/2014   Procedure: COLONOSCOPY;  Surgeon: Daneil Dolin, MD;  [facility];  Service: Endoscopy;  Laterality: N/A;  7:30-rescheduled 9/17 to [place] notified pt  . CYSTOSCOPY WITH STENT PLACEMENT Right 01/27/2016   Procedure: CYSTOSCOPY WITH STENT PLACEMENT;  Surgeon: [name], MD;  [facility];  Service: Urology;  Laterality: Right;  . CYSTOSCOPY/RETROGRADE/URETEROSCOPY/STONE EXTRACTION WITH BASKET Right 01/27/2016   Procedure: CYSTOSCOPY, RIGHT RETROGRADE, RIGHT URETEROSCOPY, STONE EXTRACTION ;  Surgeon: [name], MD;  Location[facility];  Service: Urology;  Laterality: Right;  . ESOPHAGOGASTRODUODENOSCOPY  10/15/2008     Dr Rourk:Schatzki's ring status post dilation and disruption via 27 F Maloney dilator/ otherwise unremarkable esophagus, small hiatal hernia, multiple fundal gland polyps not manipulated, gastritis, negative H.pylori  . ESOPHAGOGASTRODUODENOSCOPY  06/21/02   OHY:WVPXT sliding hiatal hernia with mild changes of reflux esophagitis limited to gastroesophageal junction.  Noncritical ring at distal esophagus, 3 cm  proximal to gastroesophageal junction/Antral gastritis  . [place]   "broke face playing softball"  . FRACTURE SURGERY     "left wrist; broke it; took spur off"  . HOLMIUM LASER APPLICATION Right 07/24/1323   Procedure: HOLMIUM LASER APPLICATION;  Surgeon: [name], MD;  Location[facility];  Service: Urology;  Laterality: Right;  . KNEE ARTHROSCOPY Right 05/18/2018   Procedure: PARTIAL MEDIAL MENISECTOMY AND SURGICAL LAVAGE AND CHONDROPLASTY;  Surgeon: [name], MD;  Location: [place];  Service: Orthopedics;  Laterality: Right;  . LEFT HEART CATHETERIZATION WITH CORONARY ANGIOGRAM N/A 12/27/2013   Procedure: LEFT HEART CATHETERIZATION WITH CORONARY ANGIOGRAM;  Surgeon: [name], MD;  Location: [facility];  Service: Cardiovascular;  Laterality: N/A;  . neck epidural    . POSTERIOR LUMBAR FUSION 4 LEVEL N/A 03/28/2018   Procedure: Thoracic eight -Lumbar two- FIXATION WITH SCREW PLACEMENT, DECOMPRESSION Thoracic ten-Thoracic eleven  FOR OSTEOMYELITIS;  Surgeon: Kristeen Miss, MD;  Location: [place];  Service: Neurosurgery;  Laterality: N/A;  . SHOULDER SURGERY Bilateral   . TEE WITHOUT CARDIOVERSION N/A 03/21/2018   Procedure: TRANSESOPHAGEAL ECHOCARDIOGRAM (TEE) WITH PROPOFOL;  Surgeon: [name], MD;  [facility];  Service: Cardiovascular;  Laterality: N/A;    There were no vitals filed for this visit.  Subjective Assessment - 09/18/18 1616    Subjective  Patient reports he has back pain intermittently but states it is never severe, more of a dull ache. He denies pain at start of session.    Pertinent History  T10-11 Epidural Abscess, back surgery on 03/28/18, right knee partial medial menisectomy and chondroplasty    Limitations  Standing;Walking;House hold activities;Lifting    Diagnostic tests  CT Lumbar Spine 05/12/18: "Spinal stimulator device, along with multilevel multisegment fusion hardware appear appropriately placed without adverse features."    Patient Stated Goals  To walk    Currently in Pain?  No/denies       Transfers:  Squat pivot transfer performed 2x with supervision from manual wheelchair to pool chair lift. Patient utilized pool chair lift to get in/out of pool for therapy session.   Adult Aquatic Therapy - 09/18/18 1619      Aquatic Therapy Subjective   Subjective  Patient reports he has back pain intermittently but states it is never severe, more of a dull ache. He denies pain at start of session.      Treatment   Gait   Forward gait with floatation belt and bil blue UE hand bars on first bout; subsequent bout provided 2x HHA. Multiple repetitions for ~ 25' with manual facilitation of Rt LE to deter scissoring gait and overstepping outside of BOS.     Exercises  Side stepping along wall, 3x RT, 20' with manual facilitation for Rt/Lt foot placement to deter ankle rolling and crossing of legs. Wall squat 1x 15 reps. Heel raises with 3 sec holds, 2x15 reps. Noodle leg press, 1x 15 reps with 1 HHA for bil LE. March with LAQ, 1x 15 reps bil LE. Hamstring stretch 2x 30 second bil LE on lowest step (patient seated on therapist's knee for support).     Specific Exercises  Hip/Low Back    Hip/Low Back  2x 2 hoop pick up with Bil LE's and 1HHA at wall (for coordination training to hook foot in hoop and march up).          PT Education - 09/18/18 1617    Education Details  Educated on safety with pool therapy and on use of chair lift for getting in/out of pool. Edcuated on exercises throughout session.    Person(s) Educated  Patient    Methods  Explanation;Demonstration;Tactile cues    Comprehension  Verbalized understanding;Returned demonstration       PT Short Term Goals - 09/13/18 1242      PT SHORT TERM GOAL #1   Title  Patient will report understanding and regular compliance with HEP to improve strength and overall functional mobility.     Baseline  09/13/18: Patient stated he has not been doing his exercises at home as often as he should.     Time  4    Period  Weeks    Status  Partially Met      PT SHORT TERM GOAL #2   Title  Patient will demonstrate improvement of 1/2 MMT strength grade in all muscle groups tested to assist patient with safe transfers, ambulation, and standing.     Baseline  09/13/18: Patient demonstrated improvement in some, but not all muscle groups tested    Time  4    Period  Weeks    Status  Partially Met      PT SHORT TERM GOAL #3   Title  Patient will report or demonstrate ability  to perform ambulation for 2 minutes with LRAD with no more than supervision assistance without rest break and reported no more than minimal difficulty.     Baseline  09/13/18: Patient is able to ambulate for about 1 minute before requiring a rest break with minimal assistance and RW.      Time  4    Period  Weeks    Status  On-going      PT SHORT TERM GOAL #4   Title  Patient will demonstrate ability to ambulate 60 feet without rest break using LRAD with no more than minimal assistance.    Time  2    Period  Weeks    Status  New    Target Date  09/27/18        PT Long Term Goals - 09/13/18 1245      PT LONG TERM GOAL #1   Title  Patient will report or demonstrate ability to perform ambulation for 5 minutes with LRAD with no more than supervision assistance without rest break and reported no more than minimal difficulty.     Baseline  09/13/18: Patient is able to ambulate for about 1 minute before requiring a rest break with minimal assistance and RW.      Time  8    Period  Weeks    Status  On-going      PT LONG TERM GOAL #2   Title  Patient will report ability to stand at a support surface for 10 minutes without a rest break in order to perform household chores with increased ease.     Baseline  09/13/18: Patient reported ability to stand for 2-3 minutes.     Time  8    Period  Weeks    Status  On-going      PT LONG TERM GOAL #3   Title  Patient will demonstrate ability to ambulate 120 feet without rest break using LRAD with no more than minimal assistance.    Baseline  Old goal: Patient will demonstrate an improvement of 10% on FOTO indicating improvement in percieved functional mobility. 09/13/18: Patient's FOTO score was found to be 46%.     Time  4    Period  Weeks    Status  Revised    Target Date  10/11/18        Plan - 09/18/18 1619    Clinical Impression Statement  Continued with aquatic therapy session today and utilized pool chair lift to transfer into/out of pool  for therapy. [employer] lifeguard operated chair lift. Patient wore XL flotation belt the entire session for safety. Patient demonstrated decreased scissoring steps while ambulation forward in pool however continues to have difficulty with coordinating side steps with Rt LE and has tendency to extend hip while stepping.  He performed majority of exercises in chest deep water and attempted dynamic balance challenges with reduced UE support. He had increased difficulty with lateral (6") hurdle step over with 2 HHA. He reported strong sensation of stretching with hamstring stretch and this muscle group remains tight. He will benefit from review of hamstring stretching at home. He denies pain at EOS and reported feeling fatigued. He will continue to benefit from skilled PT interventions to address impairments and would benefit in aquatic and land based exercises as well as BWSTT.    Rehab Potential  Fair    Clinical Impairments Affecting Rehab Potential  Positive: Highly motivated; Negative: Comorbidities    PT Frequency  3x / week    PT Duration  8 weeks    PT Treatment/Interventions  ADLs/Self Care Home Management;Aquatic Therapy;DME Instruction;Gait training;Functional mobility training;Stair training;Therapeutic activities;Therapeutic exercise;Balance training;Neuromuscular re-education;Patient/family education;Orthotic Fit/Training;Wheelchair mobility training;Manual techniques;Passive range of motion;Energy conservation;Dry needling;Splinting;Taping    PT Next Visit Plan  Progress standing and gait tolerance, monitor patient's blood pressure as needed. Adhere to BLT precautions until follow-up with physician. Continue BWSS and over ground gait training. Work on ambulation, transfers, and functional lower extremity strengthening. Continue with gait training in aquatic setting and LE strengthening. Review HEP for hamstring stretching.     PT Home Exercise Plan  08/16/18:  seated DF/PF, LAQ, marching, abd with  theraband and bridge    Consulted and Agree with Plan of Care  Patient       Patient will benefit from skilled therapeutic intervention in order to improve the following deficits and impairments:  Abnormal gait, Decreased balance, Decreased endurance, Decreased mobility, Difficulty walking, Hypomobility, Decreased range of motion, Decreased activity tolerance, Decreased strength, Impaired flexibility  Visit Diagnosis: Other abnormalities of gait and mobility  Other symptoms and signs involving the musculoskeletal system  Muscle weakness (generalized)     Problem List Patient Active Problem List   Diagnosis Date Noted  . Pseudogout of knee   . Pyogenic arthritis of right knee joint ([place])   . Falls frequently 05/08/2018  . Fall   . Sleep disturbance   . Post-op pain   . Hypokalemia   . Morbid obesity ([place])   . Postoperative pain   . DVT, lower extremity, distal, acute, bilateral ([place])   . Acute blood loss anemia   . Anemia of chronic disease   . Essential hypertension   . Bacteremia   . Myelopathy ([place]) 03/30/2018  . Paraplegia ([place])   . Postlaminectomy syndrome, lumbar region   . Epidural abscess   . Abdominal distension   . Encephalopathy   . Weakness of both lower extremities   . Discitis of thoracic region   . Spinal cord stimulator status   . Staphylococcus aureus sepsis ([place]) 03/20/2018  . Bacteremia due to methicillin susceptible Staphylococcus aureus (MSSA) 03/18/2018  . Obesity 03/18/2018  . Nephrolithiasis 03/16/2018  . AKI (acute  kidney injury) ([place]) 03/16/2018  . Cognitive impairment 03/16/2018  . Chronic pain syndrome 03/16/2018  . B12 deficiency 01/28/2016  . Memory difficulty 07/23/2015  . History of cerebrovascular disease 07/23/2015  . FH: colon cancer 08/13/2014  . Hx of adenomatous colonic polyps 08/13/2014  . Chronic back pain 05/30/2012  . CAD, NATIVE VESSEL 07/17/2010  . HYPERCHOLESTEROLEMIA 10/31/2009  . SMOKELESS TOBACCO ABUSE 10/31/2009   . Obstructive sleep apnea 10/31/2009  . Essential hypertension, benign 10/31/2009  . GERD 10/31/2009    Kipp Brood, PT, DPT Physical Therapist with [facility]  09/18/2018 4:31 PM    [place] [address] [place], [place], [address] Phone: [phone]   Fax:  [phone]  Name: Cody Velez MRN: [id number] Date of Birth: 1946/03/03

## 2018-09-20 ENCOUNTER — Ambulatory Visit (HOSPITAL_COMMUNITY): Payer: Medicare Other | Attending: Internal Medicine

## 2018-09-20 ENCOUNTER — Other Ambulatory Visit: Payer: Self-pay

## 2018-09-20 ENCOUNTER — Encounter (HOSPITAL_COMMUNITY): Payer: Self-pay

## 2018-09-20 DIAGNOSIS — R29898 Other symptoms and signs involving the musculoskeletal system: Secondary | ICD-10-CM | POA: Insufficient documentation

## 2018-09-20 DIAGNOSIS — M6281 Muscle weakness (generalized): Secondary | ICD-10-CM | POA: Diagnosis not present

## 2018-09-20 DIAGNOSIS — R2689 Other abnormalities of gait and mobility: Secondary | ICD-10-CM | POA: Insufficient documentation

## 2018-09-20 NOTE — Therapy (Signed)
[facility] [place], [place], [address] Phone: [phone]   Fax:  [phone]  Physical Therapy Treatment  Patient Details  Name: Cody Velez MRN: [id number] Date of Birth: [birth year] Referring Provider (PT): Asencion Noble, MD   Encounter Date: 09/20/2018  PT End of Session - 09/20/18 1208    Visit Number  15    Number of Visits  25    Date for PT Re-Evaluation  10/13/18   MInireassess 09/12/18   Authorization Type  Primary: Medicare; Secondary: BCBS (75 visits per calendar year with 14 used at evaluation)    Authorization Time Period  08/15/18 - 10/14/18    Authorization - Visit Number  47   KX   Authorization - Number of Visits  75    PT Start Time  8341    PT Stop Time  1203    PT Time Calculation (min)  40 min    Equipment Utilized During Treatment  Gait belt    Activity Tolerance  Patient limited by fatigue;Patient tolerated treatment well    Behavior During Therapy  WFL for tasks assessed/performed       Past Medical History:  Diagnosis Date  . Allergic rhinitis   . Arthritis   . B12 deficiency   . Cervicogenic headache 01/28/2016  . Childhood asthma   . Chronic back pain   . Coronary atherosclerosis of native coronary artery    Mild nonobstructive CAD at catheterization January 2015  . Depression   . Essential hypertension   . Falls   . GERD (gastroesophageal reflux disease)   . History of cerebrovascular disease 07/23/2015  . History of pneumonia 02/2011  . Hyperlipidemia   . Kidney stone   . Memory difficulty 07/23/2015  . OSA (obstructive sleep apnea)    CPAP - Dr. [name]  . Prostate cancer ([place])   . PTSD (post-traumatic stress disorder)    [place]  . Rectal bleeding     Past Surgical History:  Procedure Laterality Date  . APPLICATION OF ROBOTIC ASSISTANCE FOR SPINAL PROCEDURE N/A 03/28/2018   Procedure: APPLICATION OF ROBOTIC ASSISTANCE FOR SPINAL PROCEDURE;  Surgeon: Kristeen Miss, MD;  Location:  [facility];  Service: Neurosurgery;  Laterality: N/A;  . BACK SURGERY  02/14/12   lumbar OR #7; "today redid L1L2; replaced screws; added bone from hip"  . BILATERAL KNEE ARTHROSCOPY    . COLONOSCOPY  10/15/2008   Dr. [name]: tubular adenoma   . COLONOSCOPY  12/17/2003   DQQ:IWLNLG rectal and colon  . COLONOSCOPY N/A 09/05/2014   Procedure: COLONOSCOPY;  Surgeon: Daneil Dolin, MD;  [facility];  Service: Endoscopy;  Laterality: N/A;  7:30-rescheduled 9/17 to [place] notified pt  . CYSTOSCOPY WITH STENT PLACEMENT Right 01/27/2016   Procedure: CYSTOSCOPY WITH STENT PLACEMENT;  Surgeon: [name], MD;  Location: [facility];  Service: Urology;  Laterality: Right;  . CYSTOSCOPY/RETROGRADE/URETEROSCOPY/STONE EXTRACTION WITH BASKET Right 01/27/2016   Procedure: CYSTOSCOPY, RIGHT RETROGRADE, RIGHT URETEROSCOPY, STONE EXTRACTION ;  Surgeon: [name], MD;  Location: [facility];  Service: Urology;  Laterality: Right;  . ESOPHAGOGASTRODUODENOSCOPY  10/15/2008     Dr Rourk:Schatzki's ring status post dilation and disruption via [address] dilator/ otherwise unremarkable esophagus, small hiatal hernia, multiple fundal gland polyps not manipulated, gastritis, negative H.pylori  . ESOPHAGOGASTRODUODENOSCOPY  06/21/02   XQJ:JHERD sliding hiatal hernia with mild changes of reflux esophagitis limited to gastroesophageal junction.  Noncritical ring at distal esophagus, 3 cm proximal  to gastroesophageal junction/Antral gastritis  . [place]   "broke face playing softball"  . FRACTURE SURGERY     "left wrist; broke it; took spur off"  . HOLMIUM LASER APPLICATION Right 04/24/3148   Procedure: HOLMIUM LASER APPLICATION;  Surgeon: [name], MD;  Location: [facility];  Service: Urology;  Laterality: Right;  . KNEE ARTHROSCOPY Right 05/18/2018   Procedure: PARTIAL MEDIAL MENISECTOMY AND SURGICAL LAVAGE AND CHONDROPLASTY;  Surgeon: [name], MD;  Location: [facility];   Service: Orthopedics;  Laterality: Right;  . LEFT HEART CATHETERIZATION WITH CORONARY ANGIOGRAM N/A 12/27/2013   Procedure: LEFT HEART CATHETERIZATION WITH CORONARY ANGIOGRAM;  Surgeon: [name], MD;  Location: [facility];  Service: Cardiovascular;  Laterality: N/A;  . neck epidural    . POSTERIOR LUMBAR FUSION 4 LEVEL N/A 03/28/2018   Procedure: Thoracic eight -Lumbar two- FIXATION WITH SCREW PLACEMENT, DECOMPRESSION Thoracic ten-Thoracic eleven  FOR OSTEOMYELITIS;  Surgeon: Kristeen Miss, MD;  Location: [facility];  Service: Neurosurgery;  Laterality: N/A;  . SHOULDER SURGERY Bilateral   . TEE WITHOUT CARDIOVERSION N/A 03/21/2018   Procedure: TRANSESOPHAGEAL ECHOCARDIOGRAM (TEE) WITH PROPOFOL;  Surgeon: [name], MD;  [facility];  Service: Cardiovascular;  Laterality: N/A;    There were no vitals filed for this visit.  Subjective Assessment - 09/20/18 1210    Subjective  Patient reports he was tired yesterday after having pool therapy. He is still tired but feels good overall. He states he did some exercises in the middle of the night last night but has not taken time during the day to perform HEP.     Pertinent History  T10-11 Epidural Abscess, back surgery on 03/28/18, right knee partial medial menisectomy and chondroplasty    Limitations  Standing;Walking;House hold activities;Lifting    Diagnostic tests  CT Lumbar Spine 05/12/18: "Spinal stimulator device, along with multilevel multisegment fusion hardware appear appropriately placed without adverse features."    Patient Stated Goals  To walk    Currently in Pain?  No/denies        [facility] Adult PT Treatment/Exercise - 09/20/18 0001      Ambulation/Gait   Ambulation/Gait  Yes    Ambulation/Gait Assistance  4: Min assist    Ambulation/Gait Assistance Details  Wheelchair follow for safety. Min assist at belt and cues for posture to facilitate upright trunk position, and cues to correct step pattern.    Ambulation  Distance (Feet)  100 Feet   1x 60, 1x 25, 1x 15   Assistive device  Rolling walker    Gait Pattern  Decreased dorsiflexion - left;Decreased dorsiflexion - right;Step-to pattern;Trunk flexed;Decreased step length - right;Decreased step length - left    Gait Comments  cues to improve use of RW, for more of step through pattern rather than step too      Exercises   Exercises  Knee/Hip      Knee/Hip Exercises: Stretches   Other Knee/Hip Stretches  Contract relax for hamstring: bil LE, 3x 5 sec contract/30 sec relax   patient reported feelign very good following stretch     Knee/Hip Exercises: Supine   Terminal Knee Extension  Strengthening;Both;1 set;15 reps;Limitations    Terminal Knee Extension Limitations  pink ball under knee    Bridges  Strengthening;Both;2 sets;15 reps;Limitations    Bridges Limitations  3 sec holds       PT Education - 09/20/18 1207    Education Details  Reviewed importance of HEP participation and  added hamstring stretch to program.     Person(s) Educated  Patient    Methods  Explanation;Handout    Comprehension  Verbalized understanding       PT Short Term Goals - 09/13/18 1242      PT SHORT TERM GOAL #1   Title  Patient will report understanding and regular compliance with HEP to improve strength and overall functional mobility.     Baseline  09/13/18: Patient stated he has not been doing his exercises at home as often as he should.     Time  4    Period  Weeks    Status  Partially Met      PT SHORT TERM GOAL #2   Title  Patient will demonstrate improvement of 1/2 MMT strength grade in all muscle groups tested to assist patient with safe transfers, ambulation, and standing.     Baseline  09/13/18: Patient demonstrated improvement in some, but not all muscle groups tested    Time  4    Period  Weeks    Status  Partially Met      PT SHORT TERM GOAL #3   Title  Patient will report or demonstrate ability to perform ambulation for 2 minutes with LRAD with  no more than supervision assistance without rest break and reported no more than minimal difficulty.     Baseline  09/13/18: Patient is able to ambulate for about 1 minute before requiring a rest break with minimal assistance and RW.      Time  4    Period  Weeks    Status  On-going      PT SHORT TERM GOAL #4   Title  Patient will demonstrate ability to ambulate 60 feet without rest break using LRAD with no more than minimal assistance.    Time  2    Period  Weeks    Status  New    Target Date  09/27/18        PT Long Term Goals - 09/13/18 1245      PT LONG TERM GOAL #1   Title  Patient will report or demonstrate ability to perform ambulation for 5 minutes with LRAD with no more than supervision assistance without rest break and reported no more than minimal difficulty.     Baseline  09/13/18: Patient is able to ambulate for about 1 minute before requiring a rest break with minimal assistance and RW.      Time  8    Period  Weeks    Status  On-going      PT LONG TERM GOAL #2   Title  Patient will report ability to stand at a support surface for 10 minutes without a rest break in order to perform household chores with increased ease.     Baseline  09/13/18: Patient reported ability to stand for 2-3 minutes.     Time  8    Period  Weeks    Status  On-going      PT LONG TERM GOAL #3   Title  Patient will demonstrate ability to ambulate 120 feet without rest break using LRAD with no more than minimal assistance.    Baseline  Old goal: Patient will demonstrate an improvement of 10% on FOTO indicating improvement in percieved functional mobility. 09/13/18: Patient's FOTO score was found to be 46%.     Time  4    Period  Weeks    Status  Revised    Target  Date  10/11/18        Plan - 09/20/18 1209    Clinical Impression Statement  Continued this session with gait training as initial focus. Performed with RW for 3 bouts totaling ~ 100 feet. Patient required cues for posture and to  correct steps for Rt LE when it crossed midline to prevent tripping/LOB. Supine exercises were continued with hip strengthening and focus on improve knee extension. Patient initiated contract relax stretch for bil hamstrings and reported positive response after. He perform TKE in supine to further facilitate knee extension. Patient will benefit from skilled physical therapy in order to continue progressing towards functional goals.    Rehab Potential  Fair    Clinical Impairments Affecting Rehab Potential  Positive: Highly motivated; Negative: Comorbidities    PT Frequency  3x / week    PT Duration  8 weeks    PT Treatment/Interventions  ADLs/Self Care Home Management;Aquatic Therapy;DME Instruction;Gait training;Functional mobility training;Stair training;Therapeutic activities;Therapeutic exercise;Balance training;Neuromuscular re-education;Patient/family education;Orthotic Fit/Training;Wheelchair mobility training;Manual techniques;Passive range of motion;Energy conservation;Dry needling;Splinting;Taping    PT Next Visit Plan  Continue BWSS and over ground gait training. Work on ambulation, transfers, and functional lower extremity strengthening. Continue with gait training in aquatic setting and LE strengthening. Review HEP for hamstring stretching. Perform sit to stands next session and step up in // bars.    PT Home Exercise Plan  08/16/18:  seated DF/PF, LAQ, marching, abd with theraband and bridge    Consulted and Agree with Plan of Care  Patient       Patient will benefit from skilled therapeutic intervention in order to improve the following deficits and impairments:  Abnormal gait, Decreased balance, Decreased endurance, Decreased mobility, Difficulty walking, Hypomobility, Decreased range of motion, Decreased activity tolerance, Decreased strength, Impaired flexibility  Visit Diagnosis: Other abnormalities of gait and mobility  Other symptoms and signs involving the musculoskeletal  system  Muscle weakness (generalized)      Problem List Patient Active Problem List   Diagnosis Date Noted  . Pseudogout of knee   . Pyogenic arthritis of right knee joint ([place])   . Falls frequently 05/08/2018  . Fall   . Sleep disturbance   . Post-op pain   . Hypokalemia   . Morbid obesity ([place])   . Postoperative pain   . DVT, lower extremity, distal, acute, bilateral ([place])   . Acute blood loss anemia   . Anemia of chronic disease   . Essential hypertension   . Bacteremia   . Myelopathy ([place]) 03/30/2018  . Paraplegia ([place])   . Postlaminectomy syndrome, lumbar region   . Epidural abscess   . Abdominal distension   . Encephalopathy   . Weakness of both lower extremities   . Discitis of thoracic region   . Spinal cord stimulator status   . Staphylococcus aureus sepsis ([place]) 03/20/2018  . Bacteremia due to methicillin susceptible Staphylococcus aureus (MSSA) 03/18/2018  . Obesity 03/18/2018  . Nephrolithiasis 03/16/2018  . AKI (acute kidney injury) ([place]) 03/16/2018  . Cognitive impairment 03/16/2018  . Chronic pain syndrome 03/16/2018  . B12 deficiency 01/28/2016  . Memory difficulty 07/23/2015  . History of cerebrovascular disease 07/23/2015  . FH: colon cancer 08/13/2014  . Hx of adenomatous colonic polyps 08/13/2014  . Chronic back pain 05/30/2012  . CAD, NATIVE VESSEL 07/17/2010  . HYPERCHOLESTEROLEMIA 10/31/2009  . SMOKELESS TOBACCO ABUSE 10/31/2009  . Obstructive sleep apnea 10/31/2009  . Essential hypertension, benign 10/31/2009  . GERD 10/31/2009     Apolonio Schneiders  Delight Hoh, DPT Physical Therapist with [facility]  09/20/2018 12:14 PM    [facility] [address] [place], [place], [address] Phone: [phone]   Fax:  [phone]  Name: Cody Velez MRN: [id number] Date of Birth: Apr 29, 1946

## 2018-09-20 NOTE — Patient Instructions (Signed)
Toe / Heel Raise (Sitting)    Sitting, raise heels, then rock back on heels and raise toes. Repeat 10-20 times.  Copyright  VHI. All rights reserved.   Long [employer]    Straighten operated leg and try to hold it 5 seconds. Use ____ lbs on ankle. Repeat 10-20 times. Do 2 sessions a day.  http://gt2.exer.us/310   Copyright  VHI. All rights reserved.   Marching    Alternate lifting knees as high as is comfortable, as if marching. Repeat 10-20 times each leg. Do 2 sessions per day. Note: If possible place feet on floor.  Copyright  VHI. All rights reserved.   ABDUCTION: Sitting - Exercise Ball: Resistance Band (Active)    Sit with feet flat. Lift right leg slightly and, against blue resistance band, draw it out to side. Complete 2 sets of 10 repetitions. Perform 2 sessions per day.  Copyright  VHI. All rights reserved.   Bridging    Slowly raise buttocks from floor, keeping stomach tight. Repeat 10-20 times per set. Do 1-2 sets per session. Do 2 sessions per day.  http://orth.exer.us/1096   Copyright  VHI. All rights reserved.

## 2018-09-22 ENCOUNTER — Encounter (HOSPITAL_COMMUNITY): Payer: Self-pay | Admitting: Physical Therapy

## 2018-09-22 ENCOUNTER — Ambulatory Visit (HOSPITAL_COMMUNITY): Payer: Medicare Other | Admitting: Physical Therapy

## 2018-09-22 DIAGNOSIS — R29898 Other symptoms and signs involving the musculoskeletal system: Secondary | ICD-10-CM

## 2018-09-22 DIAGNOSIS — M6281 Muscle weakness (generalized): Secondary | ICD-10-CM

## 2018-09-22 DIAGNOSIS — R2689 Other abnormalities of gait and mobility: Secondary | ICD-10-CM | POA: Diagnosis not present

## 2018-09-22 NOTE — Therapy (Signed)
[place], [place], [address] Phone: [phone]   Fax:  [phone]  Physical Therapy Treatment  Patient Details  Name: Cody Velez MRN: [id number] Date of Birth: Feb 01, 1946 Referring Provider (PT): Asencion Noble, MD   Encounter Date: 09/22/2018  PT End of Session - 09/22/18 1052    Visit Number  16    Number of Visits  25    Date for PT Re-Evaluation  10/13/18   MInireassess 09/12/18   Authorization Type  Primary: Medicare; Secondary: BCBS (23 visits per calendar year with 14 used at evaluation)    Authorization Time Period  08/15/18 - 10/14/18    Authorization - Visit Number  58   KX   Authorization - Number of Visits  75    PT Start Time  1115    PT Stop Time  1200    PT Time Calculation (min)  45 min    Equipment Utilized During Treatment  Gait belt    Activity Tolerance  Patient limited by fatigue;Patient tolerated treatment well    Behavior During Therapy  [facility] for tasks assessed/performed       Past Medical History:  Diagnosis Date  . Allergic rhinitis   . Arthritis   . B12 deficiency   . Cervicogenic headache 01/28/2016  . Childhood asthma   . Chronic back pain   . Coronary atherosclerosis of native coronary artery    Mild nonobstructive CAD at catheterization January 2015  . Depression   . Essential hypertension   . Falls   . GERD (gastroesophageal reflux disease)   . History of cerebrovascular disease 07/23/2015  . History of pneumonia 02/2011  . Hyperlipidemia   . Kidney stone   . Memory difficulty 07/23/2015  . OSA (obstructive sleep apnea)    CPAP - Dr. [name]  . Prostate cancer ([place])   . PTSD (post-traumatic stress disorder)    [place]  . Rectal bleeding     Past Surgical History:  Procedure Laterality Date  . APPLICATION OF ROBOTIC ASSISTANCE FOR SPINAL PROCEDURE N/A 03/28/2018   Procedure: APPLICATION OF ROBOTIC ASSISTANCE FOR SPINAL PROCEDURE;  Surgeon: Kristeen Miss, MD;  Location:  [place];  Service: Neurosurgery;  Laterality: N/A;  . BACK SURGERY  02/14/12   lumbar OR #7; "today redid L1L2; replaced screws; added bone from hip"  . BILATERAL KNEE ARTHROSCOPY    . COLONOSCOPY  10/15/2008   Dr. [name]: tubular adenoma   . COLONOSCOPY  12/17/2003   JQZ:ESPQZR rectal and colon  . COLONOSCOPY N/A 09/05/2014   Procedure: COLONOSCOPY;  Surgeon: Daneil Dolin, MD;  [facility];  Service: Endoscopy;  Laterality: N/A;  7:30-rescheduled 9/17 to [place] notified pt  . CYSTOSCOPY WITH STENT PLACEMENT Right 01/27/2016   Procedure: CYSTOSCOPY WITH STENT PLACEMENT;  Surgeon: [name], MD;  Location: [facility];  Service: Urology;  Laterality: Right;  . CYSTOSCOPY/RETROGRADE/URETEROSCOPY/STONE EXTRACTION WITH BASKET Right 01/27/2016   Procedure: CYSTOSCOPY, RIGHT RETROGRADE, RIGHT URETEROSCOPY, STONE EXTRACTION ;  Surgeon: [name], MD;  Location: [facility];  Service: Urology;  Laterality: Right;  . ESOPHAGOGASTRODUODENOSCOPY  10/15/2008     Dr Rourk:Schatzki's ring status post dilation and disruption via 64 F Maloney dilator/ otherwise unremarkable esophagus, small hiatal hernia, multiple fundal gland polyps not manipulated, gastritis, negative H.pylori  . ESOPHAGOGASTRODUODENOSCOPY  06/21/02   AQT:MAUQJ sliding hiatal hernia with mild changes of reflux esophagitis limited to gastroesophageal junction.  Noncritical ring at distal esophagus, 3 cm proximal  to gastroesophageal junction/Antral gastritis  . [place]   "broke face playing softball"  . FRACTURE SURGERY     "left wrist; broke it; took spur off"  . HOLMIUM LASER APPLICATION Right 03/24/8098   Procedure: HOLMIUM LASER APPLICATION;  Surgeon: [name], MD;  Location: [facility];  Service: Urology;  Laterality: Right;  . KNEE ARTHROSCOPY Right 05/18/2018   Procedure: PARTIAL MEDIAL MENISECTOMY AND SURGICAL LAVAGE AND CHONDROPLASTY;  Surgeon: [name], MD;  Location: [place];   Service: Orthopedics;  Laterality: Right;  . LEFT HEART CATHETERIZATION WITH CORONARY ANGIOGRAM N/A 12/27/2013   Procedure: LEFT HEART CATHETERIZATION WITH CORONARY ANGIOGRAM;  Surgeon: [name], MD;  Location: [facility] C[facility];  Service: Cardiovascular;  Laterality: N/A;  . neck epidural    . POSTERIOR LUMBAR FUSION 4 LEVEL N/A 03/28/2018   Procedure: Thoracic eight -Lumbar two- FIXATION WITH SCREW PLACEMENT, DECOMPRESSION Thoracic ten-Thoracic eleven  FOR OSTEOMYELITIS;  Surgeon: Kristeen Miss, MD;  Location: [place];  Service: Neurosurgery;  Laterality: N/A;  . SHOULDER SURGERY Bilateral   . TEE WITHOUT CARDIOVERSION N/A 03/21/2018   Procedure: TRANSESOPHAGEAL ECHOCARDIOGRAM (TEE) WITH PROPOFOL;  Surgeon: [name], MD;  [facility];  Service: Cardiovascular;  Laterality: N/A;    There were no vitals filed for this visit.  Subjective Assessment - 09/22/18 1049    Subjective  Patient stated he is tired today because he hasn't slept well, but denied any pain. He stated he did all his exercises yesterday.     Pertinent History  T10-11 Epidural Abscess, back surgery on 03/28/18, right knee partial medial menisectomy and chondroplasty    Limitations  Standing;Walking;House hold activities;Lifting    Diagnostic tests  CT Lumbar Spine 05/12/18: "Spinal stimulator device, along with multilevel multisegment fusion hardware appear appropriately placed without adverse features."    Patient Stated Goals  To walk    Currently in Pain?  No/denies                       [facility] Adult PT Treatment/Exercise - 09/22/18 0001      Ambulation/Gait   Ambulation/Gait  Yes    Ambulation/Gait Assistance  4: Min assist    Ambulation/Gait Assistance Details  Wheelchair follow for safety. Min guard at gait belt and verbal cues and demonstration to equalize step length    Ambulation Distance (Feet)  112 Feet   1x35 feet; 1x24 feet; 1x52 feet   Assistive device  Rolling walker    Gait  Pattern  Decreased dorsiflexion - left;Decreased dorsiflexion - right;Step-to pattern;Trunk flexed;Decreased step length - right;Decreased step length - left    Gait Comments  cues to improve use of RW, to equilize step length      Exercises   Exercises  Knee/Hip      Knee/Hip Exercises: Stretches   Other Knee/Hip Stretches  Contract relax for hamstring: bil LE, 10 second contract x 20 second relax x 10      Knee/Hip Exercises: Supine   Terminal Knee Extension  Strengthening;Both;1 set;15 reps;Limitations    Terminal Knee Extension Limitations  pink ball under knee 10 second holds    Bridges  Strengthening;Both;2 sets;15 reps;Limitations    Bridges Limitations  3 sec holds             PT Education - 09/22/18 1050    Education Details  Discussed importance of continuing HEP.     Person(s) Educated  Patient    Methods  Explanation    Comprehension  Verbalized understanding       PT Short Term Goals - 09/13/18 1242      PT SHORT TERM GOAL #1   Title  Patient will report understanding and regular compliance with HEP to improve strength and overall functional mobility.     Baseline  09/13/18: Patient stated he has not been doing his exercises at home as often as he should.     Time  4    Period  Weeks    Status  Partially Met      PT SHORT TERM GOAL #2   Title  Patient will demonstrate improvement of 1/2 MMT strength grade in all muscle groups tested to assist patient with safe transfers, ambulation, and standing.     Baseline  09/13/18: Patient demonstrated improvement in some, but not all muscle groups tested    Time  4    Period  Weeks    Status  Partially Met      PT SHORT TERM GOAL #3   Title  Patient will report or demonstrate ability to perform ambulation for 2 minutes with LRAD with no more than supervision assistance without rest break and reported no more than minimal difficulty.     Baseline  09/13/18: Patient is able to ambulate for about 1 minute before requiring  a rest break with minimal assistance and RW.      Time  4    Period  Weeks    Status  On-going      PT SHORT TERM GOAL #4   Title  Patient will demonstrate ability to ambulate 60 feet without rest break using LRAD with no more than minimal assistance.    Time  2    Period  Weeks    Status  New    Target Date  09/27/18        PT Long Term Goals - 09/13/18 1245      PT LONG TERM GOAL #1   Title  Patient will report or demonstrate ability to perform ambulation for 5 minutes with LRAD with no more than supervision assistance without rest break and reported no more than minimal difficulty.     Baseline  09/13/18: Patient is able to ambulate for about 1 minute before requiring a rest break with minimal assistance and RW.      Time  8    Period  Weeks    Status  On-going      PT LONG TERM GOAL #2   Title  Patient will report ability to stand at a support surface for 10 minutes without a rest break in order to perform household chores with increased ease.     Baseline  09/13/18: Patient reported ability to stand for 2-3 minutes.     Time  8    Period  Weeks    Status  On-going      PT LONG TERM GOAL #3   Title  Patient will demonstrate ability to ambulate 120 feet without rest break using LRAD with no more than minimal assistance.    Baseline  Old goal: Patient will demonstrate an improvement of 10% on FOTO indicating improvement in percieved functional mobility. 09/13/18: Patient's FOTO score was found to be 46%.     Time  4    Period  Weeks    Status  Revised    Target Date  10/11/18            Plan - 09/22/18 1108  Clinical Impression Statement  This session patient ambulated for 112 feet with rolling walker for bouts of 36 feet, 24 feet, and 52 feet. Therapist demonstrated and discussed with the patient to ambulate with more equal stride length and to stay with the walker. Continued with contract relax exercise this session with TKE immediately following for reinforcement.  Plan to continue progression of gait and lower extremity mobility and strengthening exercises.     Rehab Potential  Fair    Clinical Impairments Affecting Rehab Potential  Positive: Highly motivated; Negative: Comorbidities    PT Frequency  3x / week    PT Duration  8 weeks    PT Treatment/Interventions  ADLs/Self Care Home Management;Aquatic Therapy;DME Instruction;Gait training;Functional mobility training;Stair training;Therapeutic activities;Therapeutic exercise;Balance training;Neuromuscular re-education;Patient/family education;Orthotic Fit/Training;Wheelchair mobility training;Manual techniques;Passive range of motion;Energy conservation;Dry needling;Splinting;Taping    PT Next Visit Plan  Continue BWSS and over ground gait training. Work on ambulation, transfers, and functional lower extremity strengthening. Continue with gait training in aquatic setting and LE strengthening. Review HEP for hamstring stretching.     PT Home Exercise Plan  08/16/18:  seated DF/PF, LAQ, marching, abd with theraband and bridge    Consulted and Agree with Plan of Care  Patient       Patient will benefit from skilled therapeutic intervention in order to improve the following deficits and impairments:  Abnormal gait, Decreased balance, Decreased endurance, Decreased mobility, Difficulty walking, Hypomobility, Decreased range of motion, Decreased activity tolerance, Decreased strength, Impaired flexibility  Visit Diagnosis: Other abnormalities of gait and mobility  Other symptoms and signs involving the musculoskeletal system  Muscle weakness (generalized)     Problem List Patient Active Problem List   Diagnosis Date Noted  . Pseudogout of knee   . Pyogenic arthritis of right knee joint ([place])   . Falls frequently 05/08/2018  . Fall   . Sleep disturbance   . Post-op pain   . Hypokalemia   . Morbid obesity ([place])   . Postoperative pain   . DVT, lower extremity, distal, acute, bilateral ([place])   .  Acute blood loss anemia   . Anemia of chronic disease   . Essential hypertension   . Bacteremia   . Myelopathy ([place]) 03/30/2018  . Paraplegia ([place])   . Postlaminectomy syndrome, lumbar region   . Epidural abscess   . Abdominal distension   . Encephalopathy   . Weakness of both lower extremities   . Discitis of thoracic region   . Spinal cord stimulator status   . Staphylococcus aureus sepsis ([place]) 03/20/2018  . Bacteremia due to methicillin susceptible Staphylococcus aureus (MSSA) 03/18/2018  . Obesity 03/18/2018  . Nephrolithiasis 03/16/2018  . AKI (acute kidney injury) ([place]) 03/16/2018  . Cognitive impairment 03/16/2018  . Chronic pain syndrome 03/16/2018  . B12 deficiency 01/28/2016  . Memory difficulty 07/23/2015  . History of cerebrovascular disease 07/23/2015  . FH: colon cancer 08/13/2014  . Hx of adenomatous colonic polyps 08/13/2014  . Chronic back pain 05/30/2012  . CAD, NATIVE VESSEL 07/17/2010  . HYPERCHOLESTEROLEMIA 10/31/2009  . SMOKELESS TOBACCO ABUSE 10/31/2009  . Obstructive sleep apnea 10/31/2009  . Essential hypertension, benign 10/31/2009  . GERD 10/31/2009   Clarene Critchley PT, DPT 11:11 AM, 09/22/18 [place], [place], [address] Phone: [phone]   Fax:  [phone]  Name: AADIN GAUT MRN: [id number] Date of Birth: [birth year]

## 2018-09-25 ENCOUNTER — Ambulatory Visit (HOSPITAL_COMMUNITY): Payer: Medicare Other | Admitting: Physical Therapy

## 2018-09-27 ENCOUNTER — Ambulatory Visit (HOSPITAL_COMMUNITY): Payer: Medicare Other | Admitting: Physical Therapy

## 2018-09-29 ENCOUNTER — Ambulatory Visit (HOSPITAL_COMMUNITY): Payer: Medicare Other

## 2018-10-02 ENCOUNTER — Other Ambulatory Visit: Payer: Self-pay

## 2018-10-02 ENCOUNTER — Encounter (HOSPITAL_COMMUNITY): Payer: Self-pay

## 2018-10-02 ENCOUNTER — Ambulatory Visit (HOSPITAL_COMMUNITY): Payer: Medicare Other

## 2018-10-02 DIAGNOSIS — R2689 Other abnormalities of gait and mobility: Secondary | ICD-10-CM

## 2018-10-02 DIAGNOSIS — R29898 Other symptoms and signs involving the musculoskeletal system: Secondary | ICD-10-CM

## 2018-10-02 DIAGNOSIS — M6281 Muscle weakness (generalized): Secondary | ICD-10-CM | POA: Diagnosis not present

## 2018-10-02 NOTE — Therapy (Signed)
[place], [place], [address] Phone: [phone]   Fax:  [phone]  Physical Therapy Aquatic Treatment  Patient Details  Name: Cody Velez MRN: [id number] Date of Birth: 01-02-1946 Referring Provider (PT): Asencion Noble, MD   Encounter Date: 10/02/2018  PT End of Session - 10/02/18 1658    Visit Number  17    Number of Visits  25    Date for PT Re-Evaluation  10/13/18   MInireassess 09/12/18   Authorization Type  Primary: Medicare; Secondary: BCBS (73 visits per calendar year with 14 used at evaluation)    Authorization Time Period  08/15/18 - 10/14/18    Authorization - Visit Number  22   KX   Authorization - Number of Visits  75    PT Start Time  1301    PT Stop Time  1346    PT Time Calculation (min)  45 min    Equipment Utilized During Treatment  Gait belt    Activity Tolerance  Patient tolerated treatment well    Behavior During Therapy  WFL for tasks assessed/performed       Past Medical History:  Diagnosis Date  . Allergic rhinitis   . Arthritis   . B12 deficiency   . Cervicogenic headache 01/28/2016  . Childhood asthma   . Chronic back pain   . Coronary atherosclerosis of native coronary artery    Mild nonobstructive CAD at catheterization January 2015  . Depression   . Essential hypertension   . Falls   . GERD (gastroesophageal reflux disease)   . History of cerebrovascular disease 07/23/2015  . History of pneumonia 02/2011  . Hyperlipidemia   . Kidney stone   . Memory difficulty 07/23/2015  . OSA (obstructive sleep apnea)    CPAP - Dr. [name]  . Prostate cancer ([place])   . PTSD (post-traumatic stress disorder)    [place]  . Rectal bleeding     Past Surgical History:  Procedure Laterality Date  . APPLICATION OF ROBOTIC ASSISTANCE FOR SPINAL PROCEDURE N/A 03/28/2018   Procedure: APPLICATION OF ROBOTIC ASSISTANCE FOR SPINAL PROCEDURE;  Surgeon: Kristeen Miss, MD;  Location: [facility];  Service:  Neurosurgery;  Laterality: N/A;  . BACK SURGERY  02/14/12   lumbar OR #7; "today redid L1L2; replaced screws; added bone from hip"  . BILATERAL KNEE ARTHROSCOPY    . COLONOSCOPY  10/15/2008   Dr. [name]: tubular adenoma   . COLONOSCOPY  12/17/2003   URK:YHCWCB rectal and colon  . COLONOSCOPY N/A 09/05/2014   Procedure: COLONOSCOPY;  Surgeon: Daneil Dolin, MD;  [facility];  Service: Endoscopy;  Laterality: N/A;  7:30-rescheduled 9/17 to [place] notified pt  . CYSTOSCOPY WITH STENT PLACEMENT Right 01/27/2016   Procedure: CYSTOSCOPY WITH STENT PLACEMENT;  Surgeon: [name], MD;  Location: [facility];  Service: Urology;  Laterality: Right;  . CYSTOSCOPY/RETROGRADE/URETEROSCOPY/STONE EXTRACTION WITH BASKET Right 01/27/2016   Procedure: CYSTOSCOPY, RIGHT RETROGRADE, RIGHT URETEROSCOPY, STONE EXTRACTION ;  Surgeon: [name], MD;  Location: [facility];  Service: Urology;  Laterality: Right;  . ESOPHAGOGASTRODUODENOSCOPY  10/15/2008     Dr Rourk:Schatzki's ring status post dilation and disruption via 35 F Maloney dilator/ otherwise unremarkable esophagus, small hiatal hernia, multiple fundal gland polyps not manipulated, gastritis, negative H.pylori  . ESOPHAGOGASTRODUODENOSCOPY  06/21/02   JSE:GBTDV sliding hiatal hernia with mild changes of reflux esophagitis limited to gastroesophageal junction.  Noncritical ring at distal esophagus, 3 cm proximal to gastroesophageal  junction/Antral gastritis  . [place]   "broke face playing softball"  . FRACTURE SURGERY     "left wrist; broke it; took spur off"  . HOLMIUM LASER APPLICATION Right 12/27/8414   Procedure: HOLMIUM LASER APPLICATION;  Surgeon: [name], MD;  Location: [facility];  Service: Urology;  Laterality: Right;  . KNEE ARTHROSCOPY Right 05/18/2018   Procedure: PARTIAL MEDIAL MENISECTOMY AND SURGICAL LAVAGE AND CHONDROPLASTY;  Surgeon: [name], MD;  Location: [facility];  Service: Orthopedics;   Laterality: Right;  . LEFT HEART CATHETERIZATION WITH CORONARY ANGIOGRAM N/A 12/27/2013   Procedure: LEFT HEART CATHETERIZATION WITH CORONARY ANGIOGRAM;  Surgeon: [name], MD;  Location: [facility];  Service: Cardiovascular;  Laterality: N/A;  . neck epidural    . POSTERIOR LUMBAR FUSION 4 LEVEL N/A 03/28/2018   Procedure: Thoracic eight -Lumbar two- FIXATION WITH SCREW PLACEMENT, DECOMPRESSION Thoracic ten-Thoracic eleven  FOR OSTEOMYELITIS;  Surgeon: Kristeen Miss, MD;  Location: [facility];  Service: Neurosurgery;  Laterality: N/A;  . SHOULDER SURGERY Bilateral   . TEE WITHOUT CARDIOVERSION N/A 03/21/2018   Procedure: TRANSESOPHAGEAL ECHOCARDIOGRAM (TEE) WITH PROPOFOL;  Surgeon: [name], MD;  [facility];  Service: Cardiovascular;  Laterality: N/A;    There were no vitals filed for this visit.  Subjective Assessment - 10/02/18 1644    Subjective  Patient reports he is doing well. He did try to do his exercises this past week. He reports he did not do the bridges at home but that he is trying to spend more time standing and doing sit to stands. He reports he has been trying to pull his pants up in standing when he gets dressed.    Pertinent History  T10-11 Epidural Abscess, back surgery on 03/28/18, right knee partial medial menisectomy and chondroplasty    Limitations  Standing;Walking;House hold activities;Lifting    Diagnostic tests  CT Lumbar Spine 05/12/18: "Spinal stimulator device, along with multilevel multisegment fusion hardware appear appropriately placed without adverse features."    Patient Stated Goals  To walk    Currently in Pain?  No/denies         Transfers:  Squat pivot transfer performed 2x with supervision from manual wheelchair to pool chair lift. Patient utilized pool chair lift to get in/out of pool for therapy session.   Adult Aquatic Therapy - 10/02/18 1644      Aquatic Therapy Subjective   Subjective  Patient reports he is doing well. He  did try to do his exercises this past week. He reports he did not do the bridges at home but that he is trying to spend more time standing and doing sit to stands. He reports he has been trying to pull his pants up in standing when he gets dressed.      Treatment   Gait  Forward gait with floatation belt and bil UE support on multicolor long hand bar for 3x 15' RT. 2 bouts with 2x HHA for ~25'.    Exercises  Side stepping along wall, 3x RT, 20' (no assistance for foot placement this session). Wall squat 1x 20 reps. Heel raises with 3 sec holds, 1x15 reps. Noodle leg press, 1x 15 reps with 1 HHA for bil LE. March with LAQ (with noodle under knee for support), 1x 15 reps bil LE. Hamstring stretch 2x 30 second bil LE on second step (patient seated on therapist's knee for support).    Specific Exercises  Hip/Low Back  Hip/Low Back  2x 20' kicking in supine for LE strengthening.    Balance  Side step on noodle 4x RT with 2x HHA and tactile cue for step width.         PT Short Term Goals - 09/13/18 1242      PT SHORT TERM GOAL #1   Title  Patient will report understanding and regular compliance with HEP to improve strength and overall functional mobility.     Baseline  09/13/18: Patient stated he has not been doing his exercises at home as often as he should.     Time  4    Period  Weeks    Status  Partially Met      PT SHORT TERM GOAL #2   Title  Patient will demonstrate improvement of 1/2 MMT strength grade in all muscle groups tested to assist patient with safe transfers, ambulation, and standing.     Baseline  09/13/18: Patient demonstrated improvement in some, but not all muscle groups tested    Time  4    Period  Weeks    Status  Partially Met      PT SHORT TERM GOAL #3   Title  Patient will report or demonstrate ability to perform ambulation for 2 minutes with LRAD with no more than supervision assistance without rest break and reported no more than minimal difficulty.     Baseline   09/13/18: Patient is able to ambulate for about 1 minute before requiring a rest break with minimal assistance and RW.      Time  4    Period  Weeks    Status  On-going      PT SHORT TERM GOAL #4   Title  Patient will demonstrate ability to ambulate 60 feet without rest break using LRAD with no more than minimal assistance.    Time  2    Period  Weeks    Status  New    Target Date  09/27/18        PT Long Term Goals - 09/13/18 1245      PT LONG TERM GOAL #1   Title  Patient will report or demonstrate ability to perform ambulation for 5 minutes with LRAD with no more than supervision assistance without rest break and reported no more than minimal difficulty.     Baseline  09/13/18: Patient is able to ambulate for about 1 minute before requiring a rest break with minimal assistance and RW.      Time  8    Period  Weeks    Status  On-going      PT LONG TERM GOAL #2   Title  Patient will report ability to stand at a support surface for 10 minutes without a rest break in order to perform household chores with increased ease.     Baseline  09/13/18: Patient reported ability to stand for 2-3 minutes.     Time  8    Period  Weeks    Status  On-going      PT LONG TERM GOAL #3   Title  Patient will demonstrate ability to ambulate 120 feet without rest break using LRAD with no more than minimal assistance.    Baseline  Old goal: Patient will demonstrate an improvement of 10% on FOTO indicating improvement in percieved functional mobility. 09/13/18: Patient's FOTO score was found to be 46%.     Time  4    Period  Weeks  Status  Revised    Target Date  10/11/18        Plan - 10/02/18 1659    Clinical Impression Statement  Continued with aquatic therapy session today and utilized pool chair lift to transfer into/out of pool for therapy. [employer] lifeguard operated chair lift. Patient wore XL flotation belt the entire session for safety. Patient's gait continues to improve with decreased  scissoring steps with walking and was able to correct step length/width himself. Continued with focus on strengthening LE and stretching. Patient required seated position again for hamstring stretch and bil UE support. He will continue to benefit from skilled PT interventions to address impairments and would benefit in aquatic and land based exercises as well as BWSTT.    Rehab Potential  Fair    Clinical Impairments Affecting Rehab Potential  Positive: Highly motivated; Negative: Comorbidities    PT Frequency  3x / week    PT Duration  8 weeks    PT Treatment/Interventions  ADLs/Self Care Home Management;Aquatic Therapy;DME Instruction;Gait training;Functional mobility training;Stair training;Therapeutic activities;Therapeutic exercise;Balance training;Neuromuscular re-education;Patient/family education;Orthotic Fit/Training;Wheelchair mobility training;Manual techniques;Passive range of motion;Energy conservation;Dry needling;Splinting;Taping    PT Next Visit Plan  Continue BWSS and over ground gait training. Work on ambulation, transfers, and functional lower extremity strengthening. Add gastric stretch and continue with hamstring stretch. Continue with gait training in aquatic setting and LE strengthening.    PT Home Exercise Plan  08/16/18:  seated DF/PF, LAQ, marching, abd with theraband and bridge    Consulted and Agree with Plan of Care  Patient       Patient will benefit from skilled therapeutic intervention in order to improve the following deficits and impairments:  Abnormal gait, Decreased balance, Decreased endurance, Decreased mobility, Difficulty walking, Hypomobility, Decreased range of motion, Decreased activity tolerance, Decreased strength, Impaired flexibility  Visit Diagnosis: Other abnormalities of gait and mobility  Other symptoms and signs involving the musculoskeletal system  Muscle weakness (generalized)     Problem List Patient Active Problem List   Diagnosis Date  Noted  . Pseudogout of knee   . Pyogenic arthritis of right knee joint ([place])   . Falls frequently 05/08/2018  . Fall   . Sleep disturbance   . Post-op pain   . Hypokalemia   . Morbid obesity ([place])   . Postoperative pain   . DVT, lower extremity, distal, acute, bilateral ([place])   . Acute blood loss anemia   . Anemia of chronic disease   . Essential hypertension   . Bacteremia   . Myelopathy ([place]) 03/30/2018  . Paraplegia ([place])   . Postlaminectomy syndrome, lumbar region   . Epidural abscess   . Abdominal distension   . Encephalopathy   . Weakness of both lower extremities   . Discitis of thoracic region   . Spinal cord stimulator status   . Staphylococcus aureus sepsis ([place]) 03/20/2018  . Bacteremia due to methicillin susceptible Staphylococcus aureus (MSSA) 03/18/2018  . Obesity 03/18/2018  . Nephrolithiasis 03/16/2018  . AKI (acute kidney injury) ([place]) 03/16/2018  . Cognitive impairment 03/16/2018  . Chronic pain syndrome 03/16/2018  . B12 deficiency 01/28/2016  . Memory difficulty 07/23/2015  . History of cerebrovascular disease 07/23/2015  . FH: colon cancer 08/13/2014  . Hx of adenomatous colonic polyps 08/13/2014  . Chronic back pain 05/30/2012  . CAD, NATIVE VESSEL 07/17/2010  . HYPERCHOLESTEROLEMIA 10/31/2009  . SMOKELESS TOBACCO ABUSE 10/31/2009  . Obstructive sleep apnea 10/31/2009  . Essential hypertension, benign 10/31/2009  . GERD 10/31/2009  Kipp Brood, PT, DPT Physical Therapist with [facility]  10/02/2018 5:04 PM    [facility] [address] [place], [place], [address] Phone: [phone]   Fax:  [phone]  Name: Cody Velez MRN: [id number] Date of Birth: 1946/03/04

## 2018-10-04 ENCOUNTER — Ambulatory Visit (HOSPITAL_COMMUNITY): Payer: Medicare Other

## 2018-10-04 ENCOUNTER — Encounter (HOSPITAL_COMMUNITY): Payer: Self-pay

## 2018-10-04 DIAGNOSIS — R2689 Other abnormalities of gait and mobility: Secondary | ICD-10-CM | POA: Diagnosis not present

## 2018-10-04 DIAGNOSIS — R29898 Other symptoms and signs involving the musculoskeletal system: Secondary | ICD-10-CM | POA: Diagnosis not present

## 2018-10-04 DIAGNOSIS — M6281 Muscle weakness (generalized): Secondary | ICD-10-CM | POA: Diagnosis not present

## 2018-10-04 NOTE — Therapy (Signed)
[facility] [place], [place], [address] Phone: [phone]   Fax:  [phone]  Physical Therapy Treatment  Patient Details  Name: Cody Velez MRN: [id number] Date of Birth: 1946-05-01 Referring Provider (PT): Asencion Noble, MD   Encounter Date: 10/04/2018  PT End of Session - 10/04/18 1412    Visit Number  18    Number of Visits  25    Date for PT Re-Evaluation  10/13/18   Minireassess 09/12/18   Authorization Type  Primary: Medicare; Secondary: BCBS (75 visits per calendar year with 14 used at evaluation)    Authorization Time Period  08/15/18 - 10/14/18    Authorization - Visit Number  86   KX   Authorization - Number of Visits  21    PT Start Time  0905    PT Stop Time  0945    PT Time Calculation (min)  40 min    Equipment Utilized During Treatment  Gait belt    Activity Tolerance  Patient tolerated treatment well;Patient limited by fatigue    Behavior During Therapy  WFL for tasks assessed/performed       Past Medical History:  Diagnosis Date  . Allergic rhinitis   . Arthritis   . B12 deficiency   . Cervicogenic headache 01/28/2016  . Childhood asthma   . Chronic back pain   . Coronary atherosclerosis of native coronary artery    Mild nonobstructive CAD at catheterization January 2015  . Depression   . Essential hypertension   . Falls   . GERD (gastroesophageal reflux disease)   . History of cerebrovascular disease 07/23/2015  . History of pneumonia 02/2011  . Hyperlipidemia   . Kidney stone   . Memory difficulty 07/23/2015  . OSA (obstructive sleep apnea)    CPAP - Dr. [name]  . Prostate cancer ([place])   . PTSD (post-traumatic stress disorder)    [place]  . Rectal bleeding     Past Surgical History:  Procedure Laterality Date  . APPLICATION OF ROBOTIC ASSISTANCE FOR SPINAL PROCEDURE N/A 03/28/2018   Procedure: APPLICATION OF ROBOTIC ASSISTANCE FOR SPINAL PROCEDURE;  Surgeon: Kristeen Miss, MD;   Location: [facility];  Service: Neurosurgery;  Laterality: N/A;  . BACK SURGERY  02/14/12   lumbar OR #7; "today redid L1L2; replaced screws; added bone from hip"  . BILATERAL KNEE ARTHROSCOPY    . COLONOSCOPY  10/15/2008   Dr. [name]: tubular adenoma   . COLONOSCOPY  12/17/2003   [facility]:EXHBZJ rectal and colon  . COLONOSCOPY N/A 09/05/2014   Procedure: COLONOSCOPY;  Surgeon: Daneil Dolin, MD;  [facility];  Service: Endoscopy;  Laterality: N/A;  7:30-rescheduled 9/17 to [place] notified pt  . CYSTOSCOPY WITH STENT PLACEMENT Right 01/27/2016   Procedure: CYSTOSCOPY WITH STENT PLACEMENT;  Surgeon: [name], MD;  Location: [facility];  Service: Urology;  Laterality: Right;  . CYSTOSCOPY/RETROGRADE/URETEROSCOPY/STONE EXTRACTION WITH BASKET Right 01/27/2016   Procedure: CYSTOSCOPY, RIGHT RETROGRADE, RIGHT URETEROSCOPY, STONE EXTRACTION ;  Surgeon: [name], MD;  Location: [facility];  Service: Urology;  Laterality: Right;  . ESOPHAGOGASTRODUODENOSCOPY  10/15/2008     Dr Rourk:Schatzki's ring status post dilation and disruption via 60 F Maloney dilator/ otherwise unremarkable esophagus, small hiatal hernia, multiple fundal gland polyps not manipulated, gastritis, negative H.pylori  . ESOPHAGOGASTRODUODENOSCOPY  06/21/02   [facility]:VELFY sliding hiatal hernia with mild changes of reflux esophagitis limited to gastroesophageal junction.  Noncritical ring at distal esophagus, 3 cm proximal  to gastroesophageal junction/Antral gastritis  . [place]   "broke face playing softball"  . FRACTURE SURGERY     "left wrist; broke it; took spur off"  . HOLMIUM LASER APPLICATION Right 04/19/8840   Procedure: HOLMIUM LASER APPLICATION;  Surgeon: [name], MD;  Location: [facility];  Service: Urology;  Laterality: Right;  . KNEE ARTHROSCOPY Right 05/18/2018   Procedure: PARTIAL MEDIAL MENISECTOMY AND SURGICAL LAVAGE AND CHONDROPLASTY;  Surgeon: [name], MD;  Location: [facility];  Service: Orthopedics;  Laterality: Right;  . LEFT HEART CATHETERIZATION WITH CORONARY ANGIOGRAM N/A 12/27/2013   Procedure: LEFT HEART CATHETERIZATION WITH CORONARY ANGIOGRAM;  Surgeon: [name], MD;  Location: [facility] CATH [facility];  Service: Cardiovascular;  Laterality: N/A;  . neck epidural    . POSTERIOR LUMBAR FUSION 4 LEVEL N/A 03/28/2018   Procedure: Thoracic eight -Lumbar two- FIXATION WITH SCREW PLACEMENT, DECOMPRESSION Thoracic ten-Thoracic eleven  FOR OSTEOMYELITIS;  Surgeon: Kristeen Miss, MD;  Location: [facility];  Service: Neurosurgery;  Laterality: N/A;  . SHOULDER SURGERY Bilateral   . TEE WITHOUT CARDIOVERSION N/A 03/21/2018   Procedure: TRANSESOPHAGEAL ECHOCARDIOGRAM (TEE) WITH PROPOFOL;  Surgeon: [name], MD;  [facility];  Service: Cardiovascular;  Laterality: N/A;    There were no vitals filed for this visit.  Subjective Assessment - 10/04/18 0905    Subjective  Pt stated he's increased his compliance wiht HEP.  No reports of pain currently.      Pertinent History  T10-11 Epidural Abscess, back surgery on 03/28/18, right knee partial medial menisectomy and chondroplasty    Patient Stated Goals  To walk    Currently in Pain?  No/denies                       [facility] Adult PT Treatment/Exercise - 10/04/18 0001      Ambulation/Gait   Ambulation/Gait  Yes    Ambulation/Gait Assistance  4: Min assist    Ambulation/Gait Assistance Details  Wheelchair follow for safety     Ambulation Distance (Feet)  85 Feet   3 sets: 28', 22' and 30' ft   Assistive device  Rolling walker    Gait Pattern  Decreased dorsiflexion - left;Decreased dorsiflexion - right;Step-to pattern;Trunk flexed;Decreased step length - right;Decreased step length - left    Gait Comments  cues to improve use of RW, to equilize step length      Exercises   Exercises  Knee/Hip      Knee/Hip Exercises: Stretches   Passive Hamstring Stretch  3 reps;20 seconds    Passive Hamstring  Stretch Limitations  seated stretch    Other Knee/Hip Stretches  Contract relax for hamstring: bil LE, 10 second contract x 20 second relax x 10      Knee/Hip Exercises: Standing   Forward Step Up  Both;2 sets;5 reps;Hand Hold: 2;Step Height: 4"    Forward Step Up Limitations  min-mod A for safety.  Pt c/o Rt knee buckling x 2      Knee/Hip Exercises: Seated   Sit to Sand  1 set;10 reps;with UE support      Knee/Hip Exercises: Supine   Terminal Knee Extension  Strengthening;Both;1 set;15 reps;Limitations    Terminal Knee Extension Limitations  pink ball under knee 10 second holds    Bridges  Strengthening;Both;2 sets;15 reps;Limitations    Bridges Limitations  3 sec holds  PT Short Term Goals - 09/13/18 1242      PT SHORT TERM GOAL #1   Title  Patient will report understanding and regular compliance with HEP to improve strength and overall functional mobility.     Baseline  09/13/18: Patient stated he has not been doing his exercises at home as often as he should.     Time  4    Period  Weeks    Status  Partially Met      PT SHORT TERM GOAL #2   Title  Patient will demonstrate improvement of 1/2 MMT strength grade in all muscle groups tested to assist patient with safe transfers, ambulation, and standing.     Baseline  09/13/18: Patient demonstrated improvement in some, but not all muscle groups tested    Time  4    Period  Weeks    Status  Partially Met      PT SHORT TERM GOAL #3   Title  Patient will report or demonstrate ability to perform ambulation for 2 minutes with LRAD with no more than supervision assistance without rest break and reported no more than minimal difficulty.     Baseline  09/13/18: Patient is able to ambulate for about 1 minute before requiring a rest break with minimal assistance and RW.      Time  4    Period  Weeks    Status  On-going      PT SHORT TERM GOAL #4   Title  Patient will demonstrate ability to ambulate 60 feet without  rest break using LRAD with no more than minimal assistance.    Time  2    Period  Weeks    Status  New    Target Date  09/27/18        PT Long Term Goals - 09/13/18 1245      PT LONG TERM GOAL #1   Title  Patient will report or demonstrate ability to perform ambulation for 5 minutes with LRAD with no more than supervision assistance without rest break and reported no more than minimal difficulty.     Baseline  09/13/18: Patient is able to ambulate for about 1 minute before requiring a rest break with minimal assistance and RW.      Time  8    Period  Weeks    Status  On-going      PT LONG TERM GOAL #2   Title  Patient will report ability to stand at a support surface for 10 minutes without a rest break in order to perform household chores with increased ease.     Baseline  09/13/18: Patient reported ability to stand for 2-3 minutes.     Time  8    Period  Weeks    Status  On-going      PT LONG TERM GOAL #3   Title  Patient will demonstrate ability to ambulate 120 feet without rest break using LRAD with no more than minimal assistance.    Baseline  Old goal: Patient will demonstrate an improvement of 10% on FOTO indicating improvement in percieved functional mobility. 09/13/18: Patient's FOTO score was found to be 46%.     Time  4    Period  Weeks    Status  Revised    Target Date  10/11/18            Plan - 10/04/18 1414    Clinical Impression Statement  Continued session focus with transfer training, gait  and functional strengthening.  Gait training complete wiht RW and assistance with wheelchair following for safety.  3 sets complete with rest breaks required per fatigue and c/o Rt knee buckling this session.  Resumed sit to stand and steps for functional strengthening, min-modA during step up training for safety with knee buckling.  No reports of increased pain through session, was limited by fatigue.      Rehab Potential  Fair    Clinical Impairments Affecting Rehab  Potential  Positive: Highly motivated; Negative: Comorbidities    PT Frequency  3x / week    PT Duration  8 weeks    PT Treatment/Interventions  ADLs/Self Care Home Management;Aquatic Therapy;DME Instruction;Gait training;Functional mobility training;Stair training;Therapeutic activities;Therapeutic exercise;Balance training;Neuromuscular re-education;Patient/family education;Orthotic Fit/Training;Wheelchair mobility training;Manual techniques;Passive range of motion;Energy conservation;Dry needling;Splinting;Taping    PT Next Visit Plan  Continue BWSS and over ground gait training. Work on ambulation, transfers, and functional lower extremity strengthening. Add gastric stretch and continue with hamstring stretch. Continue with gait training in aquatic setting and LE strengthening.    PT Home Exercise Plan  08/16/18:  seated DF/PF, LAQ, marching, abd with theraband and bridge       Patient will benefit from skilled therapeutic intervention in order to improve the following deficits and impairments:  Abnormal gait, Decreased balance, Decreased endurance, Decreased mobility, Difficulty walking, Hypomobility, Decreased range of motion, Decreased activity tolerance, Decreased strength, Impaired flexibility  Visit Diagnosis: Other abnormalities of gait and mobility  Other symptoms and signs involving the musculoskeletal system  Muscle weakness (generalized)     Problem List Patient Active Problem List   Diagnosis Date Noted  . Pseudogout of knee   . Pyogenic arthritis of right knee joint ([place])   . Falls frequently 05/08/2018  . Fall   . Sleep disturbance   . Post-op pain   . Hypokalemia   . Morbid obesity ([place])   . Postoperative pain   . DVT, lower extremity, distal, acute, bilateral ([place])   . Acute blood loss anemia   . Anemia of chronic disease   . Essential hypertension   . Bacteremia   . Myelopathy ([facility]) 03/30/2018  . Paraplegia ([place])   . Postlaminectomy syndrome, lumbar region    . Epidural abscess   . Abdominal distension   . Encephalopathy   . Weakness of both lower extremities   . Discitis of thoracic region   . Spinal cord stimulator status   . Staphylococcus aureus sepsis ([place]) 03/20/2018  . Bacteremia due to methicillin susceptible Staphylococcus aureus (MSSA) 03/18/2018  . Obesity 03/18/2018  . Nephrolithiasis 03/16/2018  . AKI (acute kidney injury) ([place]) 03/16/2018  . Cognitive impairment 03/16/2018  . Chronic pain syndrome 03/16/2018  . B12 deficiency 01/28/2016  . Memory difficulty 07/23/2015  . History of cerebrovascular disease 07/23/2015  . FH: colon cancer 08/13/2014  . Hx of adenomatous colonic polyps 08/13/2014  . Chronic back pain 05/30/2012  . CAD, NATIVE VESSEL 07/17/2010  . HYPERCHOLESTEROLEMIA 10/31/2009  . SMOKELESS TOBACCO ABUSE 10/31/2009  . Obstructive sleep apnea 10/31/2009  . Essential hypertension, benign 10/31/2009  . GERD 10/31/2009   Ihor Austin, [place]; [place]  Aldona Lento 10/04/2018, 2:41 PM  [place] [address] [place], [place], [address] Phone: [phone]   Fax:  [phone]  Name: Cody Velez MRN: [id number] Date of Birth: 27-Jan-1946

## 2018-10-05 ENCOUNTER — Other Ambulatory Visit: Payer: Self-pay | Admitting: Internal Medicine

## 2018-10-05 DIAGNOSIS — M4644 Discitis, unspecified, thoracic region: Secondary | ICD-10-CM

## 2018-10-06 ENCOUNTER — Telehealth (HOSPITAL_COMMUNITY): Payer: Self-pay | Admitting: Physical Therapy

## 2018-10-06 ENCOUNTER — Ambulatory Visit (HOSPITAL_COMMUNITY): Payer: Medicare Other | Admitting: Physical Therapy

## 2018-10-06 NOTE — Telephone Encounter (Signed)
Spoke to patient to [place] is sick today. NF 10/06/2018

## 2018-10-09 ENCOUNTER — Other Ambulatory Visit: Payer: Self-pay

## 2018-10-09 ENCOUNTER — Ambulatory Visit (HOSPITAL_COMMUNITY): Payer: Medicare Other

## 2018-10-09 ENCOUNTER — Encounter (HOSPITAL_COMMUNITY): Payer: Self-pay

## 2018-10-09 DIAGNOSIS — R29898 Other symptoms and signs involving the musculoskeletal system: Secondary | ICD-10-CM | POA: Diagnosis not present

## 2018-10-09 DIAGNOSIS — R2689 Other abnormalities of gait and mobility: Secondary | ICD-10-CM

## 2018-10-09 DIAGNOSIS — M6281 Muscle weakness (generalized): Secondary | ICD-10-CM

## 2018-10-09 NOTE — Therapy (Signed)
[place], [place], [address] Phone: [phone]   Fax:  [phone]  Physical Therapy Aquatic Treatment  Patient Details  Name: Cody Velez MRN: [id number] Date of Birth: 1946/04/19 Referring Provider (PT): Asencion Noble, MD   Encounter Date: 10/09/2018  PT End of Session - 10/09/18 1755    Visit Number  19    Number of Visits  25    Date for PT Re-Evaluation  10/13/18   Minireassess 09/12/18   Authorization Type  Primary: Medicare; Secondary: BCBS (62 visits per calendar year with 14 used at evaluation)    Authorization Time Period  08/15/18 - 10/14/18    Authorization - Visit Number  33   KX   Authorization - Number of Visits  75    PT Start Time  1430    PT Stop Time  1511    PT Time Calculation (min)  41 min    Equipment Utilized During Treatment  Gait belt    Activity Tolerance  Patient tolerated treatment well;Patient limited by fatigue    Behavior During Therapy  WFL for tasks assessed/performed       Past Medical History:  Diagnosis Date  . Allergic rhinitis   . Arthritis   . B12 deficiency   . Cervicogenic headache 01/28/2016  . Childhood asthma   . Chronic back pain   . Coronary atherosclerosis of native coronary artery    Mild nonobstructive CAD at catheterization January 2015  . Depression   . Essential hypertension   . Falls   . GERD (gastroesophageal reflux disease)   . History of cerebrovascular disease 07/23/2015  . History of pneumonia 02/2011  . Hyperlipidemia   . Kidney stone   . Memory difficulty 07/23/2015  . OSA (obstructive sleep apnea)    CPAP - Dr. [name]  . Prostate cancer ([place])   . PTSD (post-traumatic stress disorder)    [place]  . Rectal bleeding     Past Surgical History:  Procedure Laterality Date  . APPLICATION OF ROBOTIC ASSISTANCE FOR SPINAL PROCEDURE N/A 03/28/2018   Procedure: APPLICATION OF ROBOTIC ASSISTANCE FOR SPINAL PROCEDURE;  Surgeon: Kristeen Miss, MD;   Location: [facility];  Service: Neurosurgery;  Laterality: N/A;  . BACK SURGERY  02/14/12   lumbar OR #7; "today redid L1L2; replaced screws; added bone from hip"  . BILATERAL KNEE ARTHROSCOPY    . COLONOSCOPY  10/15/2008   Dr. [name]: tubular adenoma   . COLONOSCOPY  12/17/2003   KGY:JEHUDJ rectal and colon  . COLONOSCOPY N/A 09/05/2014   Procedure: COLONOSCOPY;  Surgeon: Daneil Dolin, MD;  [facility];  Service: Endoscopy;  Laterality: N/A;  7:30-rescheduled 9/17 to [place] notified pt  . CYSTOSCOPY WITH STENT PLACEMENT Right 01/27/2016   Procedure: CYSTOSCOPY WITH STENT PLACEMENT;  Surgeon: [name], MD;  Location: [facility];  Service: Urology;  Laterality: Right;  . CYSTOSCOPY/RETROGRADE/URETEROSCOPY/STONE EXTRACTION WITH BASKET Right 01/27/2016   Procedure: CYSTOSCOPY, RIGHT RETROGRADE, RIGHT URETEROSCOPY, STONE EXTRACTION ;  Surgeon: [name], MD;  Location: [facility];  Service: Urology;  Laterality: Right;  . ESOPHAGOGASTRODUODENOSCOPY  10/15/2008     Dr Rourk:Schatzki's ring status post dilation and disruption via 72 F Maloney dilator/ otherwise unremarkable esophagus, small hiatal hernia, multiple fundal gland polyps not manipulated, gastritis, negative H.pylori  . ESOPHAGOGASTRODUODENOSCOPY  06/21/02   [facility]:WYOVZ sliding hiatal hernia with mild changes of reflux esophagitis limited to gastroesophageal junction.  Noncritical ring at distal esophagus, 3 cm  proximal to gastroesophageal junction/Antral gastritis  . [place]   "broke face playing softball"  . FRACTURE SURGERY     "left wrist; broke it; took spur off"  . HOLMIUM LASER APPLICATION Right 0/02/5008   Procedure: HOLMIUM LASER APPLICATION;  Surgeon: [name], MD;  Location: [facility];  Service: Urology;  Laterality: Right;  . KNEE ARTHROSCOPY Right 05/18/2018   Procedure: PARTIAL MEDIAL MENISECTOMY AND SURGICAL LAVAGE AND CHONDROPLASTY;  Surgeon: [name], MD;  Location: [facility];  Service: Orthopedics;  Laterality: Right;  . LEFT HEART CATHETERIZATION WITH CORONARY ANGIOGRAM N/A 12/27/2013   Procedure: LEFT HEART CATHETERIZATION WITH CORONARY ANGIOGRAM;  Surgeon: [name], MD;  Location: [facility];  Service: Cardiovascular;  Laterality: N/A;  . neck epidural    . POSTERIOR LUMBAR FUSION 4 LEVEL N/A 03/28/2018   Procedure: Thoracic eight -Lumbar two- FIXATION WITH SCREW PLACEMENT, DECOMPRESSION Thoracic ten-Thoracic eleven  FOR OSTEOMYELITIS;  Surgeon: Kristeen Miss, MD;  Location: [facility];  Service: Neurosurgery;  Laterality: N/A;  . SHOULDER SURGERY Bilateral   . TEE WITHOUT CARDIOVERSION N/A 03/21/2018   Procedure: TRANSESOPHAGEAL ECHOCARDIOGRAM (TEE) WITH PROPOFOL;  Surgeon: [name], MD;  [facility];  Service: Cardiovascular;  Laterality: N/A;    There were no vitals filed for this visit.  Subjective Assessment - 10/09/18 1740    Subjective  Patient reports he is feeling more fatigued lately. He reports he fell this past weekend in a mens bathroom when he tried to walk to the urinal and stand to use it. He states he realized he was not going to be able to stand that long and could void his bladder so her started bad to his scooter. He states he strted to fall and sat on the urinal and then ultimately sat on the floor. He states his legs are sore because they ended up under him when he fell to the floor.    Pertinent History  T10-11 Epidural Abscess, back surgery on 03/28/18, right knee partial medial menisectomy and chondroplasty    Diagnostic tests  CT Lumbar Spine 05/12/18: "Spinal stimulator device, along with multilevel multisegment fusion hardware appear appropriately placed without adverse features."    Patient Stated Goals  To walk    Currently in Pain?  No/denies        Adult Aquatic Therapy - 10/09/18 1743      Aquatic Therapy Subjective   Subjective  Patient reports he is feeling more fatigued lately. He reports he fell this  past weekend in a mens bathroom when he tried to walk to the urinal and stand to use it. He states he realized he was not going to be able to stand that long and could void his bladder so her started bad to his scooter. He states he strted to fall and sat on the urinal and then ultimately sat on the floor. He states his legs are sore because they ended up under him when he fell to the floor.      Treatment   Gait  Forward gait with floatation belt and bil UE support on multicolor long hand bar for 1x 50' performing loops in treatment area. 1 bout with 2x HHA for ~25'.     Exercises  Wall squat 1x 20 reps. Heel raises with 3 sec holds, 1x15 reps. Back leg pull in plank position on wall for support, 2x 10 reps bil LE's. 1x 20 reps hip abduction with UE support at wall.  Specific Exercises  Hip/Low Back    Balance  Marching, 1x 10 reps bil LE, with pool weight between feet to facilitate foot placement next to weight to deter scissoring. Ai Chi gathering movement, 10x bil directions, min assist for balance and cues for weight shift to maintain midline posture and prevent post LOB.          PT Education - 10/09/18 1753    Education Details  Educated on exercises throughout session and on safety concerns with reported fall. Educated on need to walk with assistance and with wheelchair follow for safety due to legs buckling.     Person(s) Educated  Patient    Methods  Explanation;Handout    Comprehension  Verbalized understanding       PT Short Term Goals - 09/13/18 1242      PT SHORT TERM GOAL #1   Title  Patient will report understanding and regular compliance with HEP to improve strength and overall functional mobility.     Baseline  09/13/18: Patient stated he has not been doing his exercises at home as often as he should.     Time  4    Period  Weeks    Status  Partially Met      PT SHORT TERM GOAL #2   Title  Patient will demonstrate improvement of 1/2 MMT strength grade in all muscle  groups tested to assist patient with safe transfers, ambulation, and standing.     Baseline  09/13/18: Patient demonstrated improvement in some, but not all muscle groups tested    Time  4    Period  Weeks    Status  Partially Met      PT SHORT TERM GOAL #3   Title  Patient will report or demonstrate ability to perform ambulation for 2 minutes with LRAD with no more than supervision assistance without rest break and reported no more than minimal difficulty.     Baseline  09/13/18: Patient is able to ambulate for about 1 minute before requiring a rest break with minimal assistance and RW.      Time  4    Period  Weeks    Status  On-going      PT SHORT TERM GOAL #4   Title  Patient will demonstrate ability to ambulate 60 feet without rest break using LRAD with no more than minimal assistance.    Time  2    Period  Weeks    Status  New    Target Date  09/27/18        PT Long Term Goals - 09/13/18 1245      PT LONG TERM GOAL #1   Title  Patient will report or demonstrate ability to perform ambulation for 5 minutes with LRAD with no more than supervision assistance without rest break and reported no more than minimal difficulty.     Baseline  09/13/18: Patient is able to ambulate for about 1 minute before requiring a rest break with minimal assistance and RW.      Time  8    Period  Weeks    Status  On-going      PT LONG TERM GOAL #2   Title  Patient will report ability to stand at a support surface for 10 minutes without a rest break in order to perform household chores with increased ease.     Baseline  09/13/18: Patient reported ability to stand for 2-3 minutes.     Time  8  Period  Weeks    Status  On-going      PT LONG TERM GOAL #3   Title  Patient will demonstrate ability to ambulate 120 feet without rest break using LRAD with no more than minimal assistance.    Baseline  Old goal: Patient will demonstrate an improvement of 10% on FOTO indicating improvement in percieved  functional mobility. 09/13/18: Patient's FOTO score was found to be 46%.     Time  4    Period  Weeks    Status  Revised    Target Date  10/11/18       Plan - 10/09/18 1755    Clinical Impression Statement  Continued with aquatic therapy focusing on gait training and LE strengthening. Patient was limited by fatigue today and requested rest breaks throughout session. He had increased scissoring gait pattern and marching exercise with visual/tactile cue for step width was performed to reduce this pattern. Patient attempted squats with no UE support in pool but required assistance for post LOB and exercise was transitioned back to wall for UE support. He complained of back pain with prolonged walking and was instructed on Ai Chi exercise for LE endurance and low back mobility/stretch. He reported decreased tightness following this exercise. He will continue to benefit from skilled PT interventions to address impairments and would benefit in aquatic and land based exercises as well as BWSTT.    Rehab Potential  Fair    Clinical Impairments Affecting Rehab Potential  Positive: Highly motivated; Negative: Comorbidities    PT Frequency  3x / week    PT Duration  8 weeks    PT Treatment/Interventions  ADLs/Self Care Home Management;Aquatic Therapy;DME Instruction;Gait training;Functional mobility training;Stair training;Therapeutic activities;Therapeutic exercise;Balance training;Neuromuscular re-education;Patient/family education;Orthotic Fit/Training;Wheelchair mobility training;Manual techniques;Passive range of motion;Energy conservation;Dry needling;Splinting;Taping    PT Next Visit Plan  Continue BWSS and over ground gait training. Work on ambulation, transfers, and functional lower extremity strengthening. Add gastroc stretch and continue with hamstring stretch. Continue with gait training in aquatic setting and LE strengthening. Initiate step ups next session.    PT Home Exercise Plan  08/16/18:   seated DF/PF, LAQ, marching, abd with theraband and bridge    Consulted and Agree with Plan of Care  Patient       Patient will benefit from skilled therapeutic intervention in order to improve the following deficits and impairments:  Abnormal gait, Decreased balance, Decreased endurance, Decreased mobility, Difficulty walking, Hypomobility, Decreased range of motion, Decreased activity tolerance, Decreased strength, Impaired flexibility  Visit Diagnosis: Other abnormalities of gait and mobility  Other symptoms and signs involving the musculoskeletal system  Muscle weakness (generalized)     Problem List Patient Active Problem List   Diagnosis Date Noted  . Pseudogout of knee   . Pyogenic arthritis of right knee joint ([place])   . Falls frequently 05/08/2018  . Fall   . Sleep disturbance   . Post-op pain   . Hypokalemia   . Morbid obesity ([place])   . Postoperative pain   . DVT, lower extremity, distal, acute, bilateral ([place])   . Acute blood loss anemia   . Anemia of chronic disease   . Essential hypertension   . Bacteremia   . Myelopathy ([place]) 03/30/2018  . Paraplegia ([place])   . Postlaminectomy syndrome, lumbar region   . Epidural abscess   . Abdominal distension   . Encephalopathy   . Weakness of both lower extremities   . Discitis of thoracic region   .  Spinal cord stimulator status   . Staphylococcus aureus sepsis ([place]) 03/20/2018  . Bacteremia due to methicillin susceptible Staphylococcus aureus (MSSA) 03/18/2018  . Obesity 03/18/2018  . Nephrolithiasis 03/16/2018  . AKI (acute kidney injury) ([place]) 03/16/2018  . Cognitive impairment 03/16/2018  . Chronic pain syndrome 03/16/2018  . B12 deficiency 01/28/2016  . Memory difficulty 07/23/2015  . History of cerebrovascular disease 07/23/2015  . FH: colon cancer 08/13/2014  . Hx of adenomatous colonic polyps 08/13/2014  . Chronic back pain 05/30/2012  . CAD, NATIVE VESSEL 07/17/2010  . HYPERCHOLESTEROLEMIA 10/31/2009   . SMOKELESS TOBACCO ABUSE 10/31/2009  . Obstructive sleep apnea 10/31/2009  . Essential hypertension, benign 10/31/2009  . GERD 10/31/2009    Kipp Brood, PT, DPT Physical Therapist with [facility]  10/09/2018 6:00 PM    [place], [place], [address] Phone: [phone]   Fax:  [phone]  Name: Cody Velez MRN: [id number] Date of Birth: Apr 28, 1946

## 2018-10-11 ENCOUNTER — Encounter (HOSPITAL_COMMUNITY): Payer: Self-pay | Admitting: Physical Therapy

## 2018-10-11 ENCOUNTER — Ambulatory Visit (HOSPITAL_COMMUNITY): Payer: Medicare Other | Admitting: Physical Therapy

## 2018-10-11 DIAGNOSIS — R29898 Other symptoms and signs involving the musculoskeletal system: Secondary | ICD-10-CM

## 2018-10-11 DIAGNOSIS — R2689 Other abnormalities of gait and mobility: Secondary | ICD-10-CM | POA: Diagnosis not present

## 2018-10-11 DIAGNOSIS — M6281 Muscle weakness (generalized): Secondary | ICD-10-CM

## 2018-10-11 NOTE — Therapy (Addendum)
[facility] [address] [place], [place], [address] Phone: [phone]   Fax:  [phone]  Physical Therapy Treatment / Progress Note  Patient Details  Name: Cody Velez MRN: [id number] Date of Birth: [birth year] Referring Provider (PT): Asencion Noble, MD   Encounter Date: 10/11/2018   Progress Note Reporting Period 09/08/18 to 10/11/18  See note below for Objective Data and Assessment of Progress/Goals.       PT End of Session - 10/11/18 1031    Visit Number  20    Number of Visits  25    Date for PT Re-Evaluation  10/13/18   Minireassess 09/12/18   Authorization Type  Primary: Medicare; Secondary: BCBS (47 visits per calendar year with 14 used at evaluation)    Authorization Time Period  08/15/18 - 10/14/18    Authorization - Visit Number  37   KX   Authorization - Number of Visits  33    PT Start Time  0905    PT Stop Time  0945    PT Time Calculation (min)  40 min    Equipment Utilized During Treatment  Gait belt    Activity Tolerance  Patient tolerated treatment well;Patient limited by fatigue    Behavior During Therapy  WFL for tasks assessed/performed       Past Medical History:  Diagnosis Date  . Allergic rhinitis   . Arthritis   . B12 deficiency   . Cervicogenic headache 01/28/2016  . Childhood asthma   . Chronic back pain   . Coronary atherosclerosis of native coronary artery    Mild nonobstructive CAD at catheterization January 2015  . Depression   . Essential hypertension   . Falls   . GERD (gastroesophageal reflux disease)   . History of cerebrovascular disease 07/23/2015  . History of pneumonia 02/2011  . Hyperlipidemia   . Kidney stone   . Memory difficulty 07/23/2015  . OSA (obstructive sleep apnea)    CPAP - Dr. [name]  . Prostate cancer ([place])   . PTSD (post-traumatic stress disorder)    [place]  . Rectal bleeding     Past Surgical History:  Procedure Laterality Date  . APPLICATION OF ROBOTIC  ASSISTANCE FOR SPINAL PROCEDURE N/A 03/28/2018   Procedure: APPLICATION OF ROBOTIC ASSISTANCE FOR SPINAL PROCEDURE;  Surgeon: Kristeen Miss, MD;  Location: [facility];  Service: Neurosurgery;  Laterality: N/A;  . BACK SURGERY  02/14/12   lumbar OR #7; "today redid L1L2; replaced screws; added bone from hip"  . BILATERAL KNEE ARTHROSCOPY    . COLONOSCOPY  10/15/2008   Dr. [name]: tubular adenoma   . COLONOSCOPY  12/17/2003   KPT:WSFKCL rectal and colon  . COLONOSCOPY N/A 09/05/2014   Procedure: COLONOSCOPY;  Surgeon: Daneil Dolin, MD;  [facility];  Service: Endoscopy;  Laterality: N/A;  7:30-rescheduled 9/17 to [place] notified pt  . CYSTOSCOPY WITH STENT PLACEMENT Right 01/27/2016   Procedure: CYSTOSCOPY WITH STENT PLACEMENT;  Surgeon: [name], MD;  Location: [facility];  Service: Urology;  Laterality: Right;  . CYSTOSCOPY/RETROGRADE/URETEROSCOPY/STONE EXTRACTION WITH BASKET Right 01/27/2016   Procedure: CYSTOSCOPY, RIGHT RETROGRADE, RIGHT URETEROSCOPY, STONE EXTRACTION ;  Surgeon: [name], MD;  Location: [facility];  Service: Urology;  Laterality: Right;  . ESOPHAGOGASTRODUODENOSCOPY  10/15/2008     Dr Rourk:Schatzki's ring status post dilation and disruption via 7 F Maloney dilator/ otherwise unremarkable esophagus, small hiatal hernia, multiple fundal gland polyps not manipulated, gastritis, negative H.pylori  .  ESOPHAGOGASTRODUODENOSCOPY  06/21/02   YTK:ZSWFU sliding hiatal hernia with mild changes of reflux esophagitis limited to gastroesophageal junction.  Noncritical ring at distal esophagus, 3 cm proximal to gastroesophageal junction/Antral gastritis  . [place]   "broke face playing softball"  . FRACTURE SURGERY     "left wrist; broke it; took spur off"  . HOLMIUM LASER APPLICATION Right 08/22/2354   Procedure: HOLMIUM LASER APPLICATION;  Surgeon: [name], MD;  Location: [facility];  Service: Urology;  Laterality: Right;  . KNEE  ARTHROSCOPY Right 05/18/2018   Procedure: PARTIAL MEDIAL MENISECTOMY AND SURGICAL LAVAGE AND CHONDROPLASTY;  Surgeon: [name], MD;  Location: [facility];  Service: Orthopedics;  Laterality: Right;  . LEFT HEART CATHETERIZATION WITH CORONARY ANGIOGRAM N/A 12/27/2013   Procedure: LEFT HEART CATHETERIZATION WITH CORONARY ANGIOGRAM;  Surgeon: [name], MD;  Location[facility] C[facility];  Service: Cardiovascular;  Laterality: N/A;  . neck epidural    . POSTERIOR LUMBAR FUSION 4 LEVEL N/A 03/28/2018   Procedure: Thoracic eight -Lumbar two- FIXATION WITH SCREW PLACEMENT, DECOMPRESSION Thoracic ten-Thoracic eleven  FOR OSTEOMYELITIS;  Surgeon: Kristeen Miss, MD;  Location: [facility];  Service: Neurosurgery;  Laterality: N/A;  . SHOULDER SURGERY Bilateral   . TEE WITHOUT CARDIOVERSION N/A 03/21/2018   Procedure: TRANSESOPHAGEAL ECHOCARDIOGRAM (TEE) WITH PROPOFOL;  Surgeon: [name], MD;  [facility];  Service: Cardiovascular;  Laterality: N/A;    There were no vitals filed for this visit.  Subjective Assessment - 10/11/18 1035    Subjective  Patient reported that he is not having any pain today. He stated he had a fall over the past weekend, which sounded like patient controlled into a sitting position and then lowered to the floor. Patient stated he has done better with performing his HEP, but not always consistently.     Pertinent History  T10-11 Epidural Abscess, back surgery on 03/28/18, right knee partial medial menisectomy and chondroplasty    Diagnostic tests  CT Lumbar Spine 05/12/18: "Spinal stimulator device, along with multilevel multisegment fusion hardware appear appropriately placed without adverse features."    Patient Stated Goals  To walk    Currently in Pain?  No/denies                       [facility] Adult PT Treatment/Exercise - 10/11/18 0001      Ambulation/Gait   Ambulation/Gait  Yes    Ambulation/Gait Assistance  4: Min assist    Ambulation/Gait  Assistance Details  Wheelchair follow for safety    Ambulation Distance (Feet)  164 Feet   3 bouts: 60', 42', and 62'   Assistive device  Rolling walker    Gait Pattern  Decreased dorsiflexion - left;Decreased dorsiflexion - right;Trunk flexed;Decreased step length - right;Decreased step length - left;Step-through pattern    Gait Comments  Cues to improve knee extension and use of RW      Knee/Hip Exercises: Stretches   Passive Hamstring Stretch  3 reps;20 seconds    Passive Hamstring Stretch Limitations  seated stretch on 6'' box    Other Knee/Hip Stretches  Contract relax for hamstring: bil LE, 10 second contract x 20 second relax x 10      Knee/Hip Exercises: Seated   Abduction/Adduction   Both;Limitations    Abd/Adduction Limitations  RTB 5'' holds    Sit to [employer]  1 set;10 reps;with UE support  PT Education - 10/11/18 1030    Education Details  Educated patient on purpose and technique of interventions throughout session. Discussed importance of continuing with HEP.     Person(s) Educated  Patient    Methods  Explanation    Comprehension  Verbalized understanding       PT Short Term Goals - 09/13/18 1242      PT SHORT TERM GOAL #1   Title  Patient will report understanding and regular compliance with HEP to improve strength and overall functional mobility.     Baseline  09/13/18: Patient stated he has not been doing his exercises at home as often as he should.     Time  4    Period  Weeks    Status  Partially Met      PT SHORT TERM GOAL #2   Title  Patient will demonstrate improvement of 1/2 MMT strength grade in all muscle groups tested to assist patient with safe transfers, ambulation, and standing.     Baseline  09/13/18: Patient demonstrated improvement in some, but not all muscle groups tested    Time  4    Period  Weeks    Status  Partially Met      PT SHORT TERM GOAL #3   Title  Patient will report or demonstrate ability to perform ambulation for  2 minutes with LRAD with no more than supervision assistance without rest break and reported no more than minimal difficulty.     Baseline  09/13/18: Patient is able to ambulate for about 1 minute before requiring a rest break with minimal assistance and RW.      Time  4    Period  Weeks    Status  On-going      PT SHORT TERM GOAL #4   Title  Patient will demonstrate ability to ambulate 60 feet without rest break using LRAD with no more than minimal assistance.    Time  2    Period  Weeks    Status Achieved   Target Date  09/27/18        PT Long Term Goals - 09/13/18 1245      PT LONG TERM GOAL #1   Title  Patient will report or demonstrate ability to perform ambulation for 5 minutes with LRAD with no more than supervision assistance without rest break and reported no more than minimal difficulty.     Baseline  09/13/18: Patient is able to ambulate for about 1 minute before requiring a rest break with minimal assistance and RW.      Time  8    Period  Weeks    Status  On-going      PT LONG TERM GOAL #2   Title  Patient will report ability to stand at a support surface for 10 minutes without a rest break in order to perform household chores with increased ease.     Baseline  09/13/18: Patient reported ability to stand for 2-3 minutes.     Time  8    Period  Weeks    Status  On-going      PT LONG TERM GOAL #3   Title  Patient will demonstrate ability to ambulate 120 feet without rest break using LRAD with no more than minimal assistance.    Baseline  Old goal: Patient will demonstrate an improvement of 10% on FOTO indicating improvement in percieved functional mobility. 09/13/18: Patient's FOTO score was found to be 46%.  Time  4    Period  Weeks    Status  Revised    Target Date  10/11/18            Plan - 10/11/18 1040    Clinical Impression Statement  This session patient reported feeling good and began session with ambulation. Patient ambulated for 60 feet, 42 feet,  and then 62 feet this session with rolling walker and with minimal assistance which is further than the patient ambulated at the last session. This session patient performed seated hip abduction with RTB with cueing to pull equally with both lower extremities. Patient has demonstrated progress towards goals as he is now able to ambulate 62 feet with rolling walker and no more than minimal assistance, when previously patient ambulated 50 feet with rolling walker with moderate assistance and patient's functional lower extremity strength has improved. Patient would benefit from continued skilled physical therapy in order to continue progressing towards functional goals.    Rehab Potential  Fair    Clinical Impairments Affecting Rehab Potential  Positive: Highly motivated; Negative: Comorbidities    PT Frequency  3x / week    PT Duration  8 weeks    PT Treatment/Interventions  ADLs/Self Care Home Management;Aquatic Therapy;DME Instruction;Gait training;Functional mobility training;Stair training;Therapeutic activities;Therapeutic exercise;Balance training;Neuromuscular re-education;Patient/family education;Orthotic Fit/Training;Wheelchair mobility training;Manual techniques;Passive range of motion;Energy conservation;Dry needling;Splinting;Taping    PT Next Visit Plan  Re-assess next session. Continue BWSS and over ground gait training. Work on ambulation, transfers, and functional lower extremity strengthening. Add gastroc stretch and continue with hamstring stretch. Continue with gait training in aquatic setting and LE strengthening. Initiate step ups next session.    PT Home Exercise Plan  08/16/18:  seated DF/PF, LAQ, marching, abd with theraband and bridge    Consulted and Agree with Plan of Care  Patient       Patient will benefit from skilled therapeutic intervention in order to improve the following deficits and impairments:  Abnormal gait, Decreased balance, Decreased endurance, Decreased mobility,  Difficulty walking, Hypomobility, Decreased range of motion, Decreased activity tolerance, Decreased strength, Impaired flexibility  Visit Diagnosis: Other abnormalities of gait and mobility  Other symptoms and signs involving the musculoskeletal system  Muscle weakness (generalized)     Problem List Patient Active Problem List   Diagnosis Date Noted  . Pseudogout of knee   . Pyogenic arthritis of right knee joint ([place])   . Falls frequently 05/08/2018  . Fall   . Sleep disturbance   . Post-op pain   . Hypokalemia   . Morbid obesity ([place])   . Postoperative pain   . DVT, lower extremity, distal, acute, bilateral ([place])   . Acute blood loss anemia   . Anemia of chronic disease   . Essential hypertension   . Bacteremia   . Myelopathy ([place]) 03/30/2018  . Paraplegia ([place])   . Postlaminectomy syndrome, lumbar region   . Epidural abscess   . Abdominal distension   . Encephalopathy   . Weakness of both lower extremities   . Discitis of thoracic region   . Spinal cord stimulator status   . Staphylococcus aureus sepsis ([place]) 03/20/2018  . Bacteremia due to methicillin susceptible Staphylococcus aureus (MSSA) 03/18/2018  . Obesity 03/18/2018  . Nephrolithiasis 03/16/2018  . AKI (acute kidney injury) ([place]) 03/16/2018  . Cognitive impairment 03/16/2018  . Chronic pain syndrome 03/16/2018  . B12 deficiency 01/28/2016  . Memory difficulty 07/23/2015  . History of cerebrovascular disease 07/23/2015  . FH: colon cancer  08/13/2014  . Hx of adenomatous colonic polyps 08/13/2014  . Chronic back pain 05/30/2012  . CAD, NATIVE VESSEL 07/17/2010  . HYPERCHOLESTEROLEMIA 10/31/2009  . SMOKELESS TOBACCO ABUSE 10/31/2009  . Obstructive sleep apnea 10/31/2009  . Essential hypertension, benign 10/31/2009  . GERD 10/31/2009   Clarene Critchley PT, DPT 10:43 AM, 10/11/18 [facility] [address] [place], [place], [address] Phone:  [phone]   Fax:  [phone]  Name: Cody Velez MRN: [id number] Date of Birth: [birth year]

## 2018-10-13 ENCOUNTER — Ambulatory Visit (HOSPITAL_COMMUNITY): Payer: Medicare Other

## 2018-10-13 ENCOUNTER — Encounter (HOSPITAL_COMMUNITY): Payer: Self-pay

## 2018-10-13 DIAGNOSIS — R2689 Other abnormalities of gait and mobility: Secondary | ICD-10-CM | POA: Diagnosis not present

## 2018-10-13 DIAGNOSIS — R29898 Other symptoms and signs involving the musculoskeletal system: Secondary | ICD-10-CM

## 2018-10-13 DIAGNOSIS — M6281 Muscle weakness (generalized): Secondary | ICD-10-CM

## 2018-10-13 NOTE — Therapy (Signed)
[place], [place], [address] Phone: [phone]   Fax:  [phone]   Progress Note Reporting Period 09/13/18 to 10/13/18  See note below for Objective Data and Assessment of Progress/Goals.   Physical Therapy Treatment  Patient Details  Name: Cody Velez MRN: [id number] Date of Birth: [birth year] Referring Provider (PT): Asencion Noble, MD   Encounter Date: 10/13/2018  PT End of Session - 10/13/18 0900    Visit Number  21    Number of Visits  43    Date for PT Re-Evaluation  11/24/18   Minireassess 11/03/18   Authorization Type  Primary: Medicare; Secondary: BCBS (75 visits per calendar year with 14 used at evaluation)    Authorization Time Period  08/15/18 - 10/14/18; NEW: 10/13/18 to 11/24/18    Authorization - Visit Number  80   KX   Authorization - Number of Visits  47    PT Start Time  0900    PT Stop Time  0940    PT Time Calculation (min)  40 min    Equipment Utilized During Treatment  Gait belt    Activity Tolerance  Patient tolerated treatment well;Patient limited by fatigue    Behavior During Therapy  WFL for tasks assessed/performed       Past Medical History:  Diagnosis Date  . Allergic rhinitis   . Arthritis   . B12 deficiency   . Cervicogenic headache 01/28/2016  . Childhood asthma   . Chronic back pain   . Coronary atherosclerosis of native coronary artery    Mild nonobstructive CAD at catheterization January 2015  . Depression   . Essential hypertension   . Falls   . GERD (gastroesophageal reflux disease)   . History of cerebrovascular disease 07/23/2015  . History of pneumonia 02/2011  . Hyperlipidemia   . Kidney stone   . Memory difficulty 07/23/2015  . OSA (obstructive sleep apnea)    CPAP - Dr. Gwenette Velez  . Prostate cancer ([place])   . PTSD (post-traumatic stress disorder)    [place]  . Rectal bleeding     Past Surgical History:  Procedure Laterality Date  . APPLICATION OF  ROBOTIC ASSISTANCE FOR SPINAL PROCEDURE N/A 03/28/2018   Procedure: APPLICATION OF ROBOTIC ASSISTANCE FOR SPINAL PROCEDURE;  Surgeon: Kristeen Miss, MD;  Location: [facility];  Service: Neurosurgery;  Laterality: N/A;  . BACK SURGERY  02/14/12   lumbar OR #7; "today redid L1L2; replaced screws; added bone from hip"  . BILATERAL KNEE ARTHROSCOPY    . COLONOSCOPY  10/15/2008   Dr. Gala Velez: tubular adenoma   . COLONOSCOPY  12/17/2003   BLT:JQZESP rectal and colon  . COLONOSCOPY N/A 09/05/2014   Procedure: COLONOSCOPY;  Surgeon: Daneil Dolin, MD;  [facility];  Service: Endoscopy;  Laterality: N/A;  7:30-rescheduled 9/17 to [place] notified pt  . CYSTOSCOPY WITH STENT PLACEMENT Right 01/27/2016   Procedure: CYSTOSCOPY WITH STENT PLACEMENT;  Surgeon: Cody Gallo, MD;  Location: [facility];  Service: Urology;  Laterality: Right;  . CYSTOSCOPY/RETROGRADE/URETEROSCOPY/STONE EXTRACTION WITH BASKET Right 01/27/2016   Procedure: CYSTOSCOPY, RIGHT RETROGRADE, RIGHT URETEROSCOPY, STONE EXTRACTION ;  Surgeon: Cody Gallo, MD;  Location: [facility];  Service: Urology;  Laterality: Right;  . ESOPHAGOGASTRODUODENOSCOPY  10/15/2008     Dr Rourk:Schatzki's ring status post dilation and disruption via 85 F Maloney dilator/ otherwise unremarkable esophagus, small hiatal hernia, multiple fundal gland polyps not manipulated, gastritis, negative H.pylori  . ESOPHAGOGASTRODUODENOSCOPY  06/21/02  IRS:WNIOE sliding hiatal hernia with mild changes of reflux esophagitis limited to gastroesophageal junction.  Noncritical ring at distal esophagus, 3 cm proximal to gastroesophageal junction/Antral gastritis  . [place]   "broke face playing softball"  . FRACTURE SURGERY     "left wrist; broke it; took spur off"  . HOLMIUM LASER APPLICATION Right 7/0/3500   Procedure: HOLMIUM LASER APPLICATION;  Surgeon: Cody Gallo, MD;  Location: [facility];  Service: Urology;  Laterality: Right;  .  KNEE ARTHROSCOPY Right 05/18/2018   Procedure: PARTIAL MEDIAL MENISECTOMY AND SURGICAL LAVAGE AND CHONDROPLASTY;  Surgeon: Cody Bond, MD;  Location: [facility];  Service: Orthopedics;  Laterality: Right;  . LEFT HEART CATHETERIZATION WITH CORONARY ANGIOGRAM N/A 12/27/2013   Procedure: LEFT HEART CATHETERIZATION WITH CORONARY ANGIOGRAM;  Surgeon: Cody M Martinique, MD;  Location: [facility];  Service: Cardiovascular;  Laterality: N/A;  . neck epidural    . POSTERIOR LUMBAR FUSION 4 LEVEL N/A 03/28/2018   Procedure: Thoracic eight -Lumbar two- FIXATION WITH SCREW PLACEMENT, DECOMPRESSION Thoracic ten-Thoracic eleven  FOR OSTEOMYELITIS;  Surgeon: Kristeen Miss, MD;  Location: [facility];  Service: Neurosurgery;  Laterality: N/A;  . SHOULDER SURGERY Bilateral   . TEE WITHOUT CARDIOVERSION N/A 03/21/2018   Procedure: TRANSESOPHAGEAL ECHOCARDIOGRAM (TEE) WITH PROPOFOL;  Surgeon: Cody Sark, MD;  [facility];  Service: Cardiovascular;  Laterality: N/A;    There were no vitals filed for this visit.  Subjective Assessment - 10/13/18 0901    Subjective  Pt states that he's doing well, he didn't want to wake up this morning. No pain.     Pertinent History  T10-11 Epidural Abscess, back surgery on 03/28/18, right knee partial medial menisectomy and chondroplasty    Diagnostic tests  CT Lumbar Spine 05/12/18: "Spinal stimulator device, along with multilevel multisegment fusion hardware appear appropriately placed without adverse features."    Patient Stated Goals  To walk    Currently in Pain?  No/denies         [facility] PT Assessment - 10/13/18 0001      Strength   Right Hip Flexion  4/5   was 4   Right Hip Extension  2+/5   was 2+   Right Hip ABduction  3-/5   was 3-   Left Hip Flexion  4-/5   was 3-   Left Hip Extension  2+/5   was 2+   Left Hip ABduction  3-/5   was 3-   Right Knee Flexion  4+/5   was 4+   Right Knee Extension  4+/5   was 4   Left Knee Flexion  4+/5   was 4+    Left Knee Extension  4+/5   was 4+   Right Ankle Dorsiflexion  4+/5   was 4+   Left Ankle Dorsiflexion  4+/5   was 4+     Ambulation/Gait   Ambulation/Gait  Yes    Ambulation/Gait Assistance  4: Min assist;3: Mod assist    Ambulation/Gait Assistance Details  w/c follow for safety    Ambulation Distance (Feet)  160 Feet   6f, 875f 7224f Assistive device  Rolling walker    Gait Pattern  Decreased dorsiflexion - left;Decreased dorsiflexion - right;Trunk flexed;Decreased step length - right;Decreased step length - left;Step-through pattern    Gait Comments  multiple L knee buckels this date (required min-mod A); amb [id number] 2.5min56mtraight without rest break; amb 8ft o8fnd bout but unable to  go further due to difficluty advancing LLE but then after further rest break, able to amb 6f without buckling           PT Education - 10/13/18 0901    Education Details  reassessment findings    Person(s) Educated  Patient    Methods  Explanation    Comprehension  Verbalized understanding       PT Short Term Goals - 10/13/18 0904      PT SHORT TERM GOAL #1   Title  Patient will report understanding and regular compliance with HEP to improve strength and overall functional mobility.     Baseline  09/13/18: Patient stated he has not been doing his exercises at home as often as he should.     Time  4    Period  Weeks    Status  Partially Met      PT SHORT TERM GOAL #2   Title  Patient will demonstrate improvement of 1/2 MMT strength grade in all muscle groups tested to assist patient with safe transfers, ambulation, and standing.     Baseline  09/13/18: Patient demonstrated improvement in some, but not all muscle groups tested    Time  4    Period  Weeks    Status  Partially Met      PT SHORT TERM GOAL #3   Title  Patient will report or demonstrate ability to perform ambulation for 2 minutes with LRAD with no more than supervision assistance without rest break and reported no more  than minimal difficulty.     Baseline  09/13/18: Patient is able to ambulate for about 1 minute before requiring a rest break with minimal assistance and RW.      Time  4    Period  Weeks    Status  On-going      PT SHORT TERM GOAL #4   Title  Patient will demonstrate ability to ambulate 60 feet without rest break using LRAD with no more than minimal assistance.    Time  2    Period  Weeks    Status  New        PT Long Term Goals - 10/13/18 02035     PT LONG TERM GOAL #1   Title  Patient will report or demonstrate ability to perform ambulation for 5 minutes with LRAD with no more than supervision assistance without rest break and reported no more than minimal difficulty.     Baseline  09/13/18: Patient is able to ambulate for about 1 minute before requiring a rest break with minimal assistance and RW.      Time  8    Period  Weeks    Status  On-going      PT LONG TERM GOAL #2   Title  Patient will report ability to stand at a support surface for 10 minutes without a rest break in order to perform household chores with increased ease.     Baseline  10/25: 3-459ms    Time  8    Period  Weeks    Status  On-going      PT LONG TERM GOAL #3   Title  Patient will demonstrate ability to ambulate 120 feet without rest break using LRAD with no more than minimal assistance.    Baseline  Old goal: Patient will demonstrate an improvement of 10% on FOTO indicating improvement in percieved functional mobility. 09/13/18: Patient's FOTO score was found to be 46%.  Time  4    Period  Weeks    Status  Revised            Plan - 10/13/18 0957    Clinical Impression Statement  PT reassessed pt's goals and outcome measures this date. Pt has overall made great progress as illustrated above. His MMT has improved since his initial evaluation and he was able to ambulate for 2:30 straight without rest break today. He did, however, have 3 bouts of L knee buckling during gait and required min-mod A  throughout gait due to weakness and unsteadiness. His tolerance to standing with support is gradually improving as he feels he can stand for 3-4 mins with support now. Though he's made great progress thus far, he is still significantly limited in his strength, functional strength, functional mobility, gait, and balance and needs continued skilled PT intervention to address his remaining impairments in order to further maximize these deficits QOL and independence at home.     Rehab Potential  Fair    Clinical Impairments Affecting Rehab Potential  Positive: Highly motivated; Negative: Comorbidities    PT Frequency  3x / week    PT Duration  6 weeks    PT Treatment/Interventions  ADLs/Self Care Home Management;Aquatic Therapy;DME Instruction;Gait training;Functional mobility training;Stair training;Therapeutic activities;Therapeutic exercise;Balance training;Neuromuscular re-education;Patient/family education;Orthotic Fit/Training;Wheelchair mobility training;Manual techniques;Passive range of motion;Energy conservation;Dry needling;Splinting;Taping    PT Next Visit Plan  Continue BWSS and over ground gait training. Work on ambulation, transfers, and functional lower extremity strengthening. Add gastroc stretch and continue with hamstring stretch. Continue with gait training in aquatic setting and LE strengthening. Initiate step ups next session.    PT Home Exercise Plan  08/16/18:  seated DF/PF, LAQ, marching, abd with theraband and bridge    Consulted and Agree with Plan of Care  Patient       Patient will benefit from skilled therapeutic intervention in order to improve the following deficits and impairments:  Abnormal gait, Decreased balance, Decreased endurance, Decreased mobility, Difficulty walking, Hypomobility, Decreased range of motion, Decreased activity tolerance, Decreased strength, Impaired flexibility  Visit Diagnosis: Other abnormalities of gait and mobility - Plan: PT plan of care  cert/re-cert  Other symptoms and signs involving the musculoskeletal system - Plan: PT plan of care cert/re-cert  Muscle weakness (generalized) - Plan: PT plan of care cert/re-cert     Problem List Patient Active Problem List   Diagnosis Date Noted  . Pseudogout of knee   . Pyogenic arthritis of right knee joint ([place])   . Falls frequently 05/08/2018  . Fall   . Sleep disturbance   . Post-op pain   . Hypokalemia   . Morbid obesity ([place])   . Postoperative pain   . DVT, lower extremity, distal, acute, bilateral ([place])   . Acute blood loss anemia   . Anemia of chronic disease   . Essential hypertension   . Bacteremia   . Myelopathy ([place]) 03/30/2018  . Paraplegia ([place])   . Postlaminectomy syndrome, lumbar region   . Epidural abscess   . Abdominal distension   . Encephalopathy   . Weakness of both lower extremities   . Discitis of thoracic region   . Spinal cord stimulator status   . Staphylococcus aureus sepsis ([place]) 03/20/2018  . Bacteremia due to methicillin susceptible Staphylococcus aureus (MSSA) 03/18/2018  . Obesity 03/18/2018  . Nephrolithiasis 03/16/2018  . AKI (acute kidney injury) ([place]) 03/16/2018  . Cognitive impairment 03/16/2018  . Chronic pain syndrome 03/16/2018  .  B12 deficiency 01/28/2016  . Memory difficulty 07/23/2015  . History of cerebrovascular disease 07/23/2015  . FH: colon cancer 08/13/2014  . Hx of adenomatous colonic polyps 08/13/2014  . Chronic back pain 05/30/2012  . CAD, NATIVE VESSEL 07/17/2010  . HYPERCHOLESTEROLEMIA 10/31/2009  . SMOKELESS TOBACCO ABUSE 10/31/2009  . Obstructive sleep apnea 10/31/2009  . Essential hypertension, benign 10/31/2009  . GERD 10/31/2009        Geraldine Solar PT, DPT  [facility] [address] [place], [place], [address] Phone: [phone]   Fax:  [phone]  Name: ARIES TOWNLEY MRN: [id number] Date of Birth: Sep 26, 1946

## 2018-10-16 ENCOUNTER — Encounter (HOSPITAL_COMMUNITY): Payer: Self-pay

## 2018-10-16 ENCOUNTER — Other Ambulatory Visit: Payer: Self-pay

## 2018-10-16 ENCOUNTER — Ambulatory Visit (HOSPITAL_COMMUNITY): Payer: Medicare Other

## 2018-10-16 DIAGNOSIS — M6281 Muscle weakness (generalized): Secondary | ICD-10-CM

## 2018-10-16 DIAGNOSIS — R29898 Other symptoms and signs involving the musculoskeletal system: Secondary | ICD-10-CM | POA: Diagnosis not present

## 2018-10-16 DIAGNOSIS — R2689 Other abnormalities of gait and mobility: Secondary | ICD-10-CM | POA: Diagnosis not present

## 2018-10-16 NOTE — Therapy (Signed)
[place], [place], [address] Phone: [phone]   Fax:  [phone]  Physical Therapy Aquatic Treatment  Patient Details  Name: Cody Velez MRN: [id number] Date of Birth: 04-25-1946 Referring Provider (PT): Asencion Noble, MD   Encounter Date: 10/16/2018  PT End of Session - 10/16/18 1646    Visit Number  22    Number of Visits  63    Date for PT Re-Evaluation  11/24/18   Minireassess 11/03/18   Authorization Type  Primary: Medicare; Secondary: BCBS (75 visits per calendar year with 14 used at evaluation)    Authorization Time Period  08/15/18 - 10/14/18; NEW: 10/13/18 to 11/24/18    Authorization - Visit Number  1   KX   Authorization - Number of Visits  75    PT Start Time  1301    PT Stop Time  1345    PT Time Calculation (min)  44 min    Equipment Utilized During Treatment  Gait belt    Activity Tolerance  Patient tolerated treatment well    Behavior During Therapy  WFL for tasks assessed/performed       Past Medical History:  Diagnosis Date  . Allergic rhinitis   . Arthritis   . B12 deficiency   . Cervicogenic headache 01/28/2016  . Childhood asthma   . Chronic back pain   . Coronary atherosclerosis of native coronary artery    Mild nonobstructive CAD at catheterization January 2015  . Depression   . Essential hypertension   . Falls   . GERD (gastroesophageal reflux disease)   . History of cerebrovascular disease 07/23/2015  . History of pneumonia 02/2011  . Hyperlipidemia   . Kidney stone   . Memory difficulty 07/23/2015  . OSA (obstructive sleep apnea)    CPAP - Dr. [name]  . Prostate cancer ([place])   . PTSD (post-traumatic stress disorder)    [place]  . Rectal bleeding     Past Surgical History:  Procedure Laterality Date  . APPLICATION OF ROBOTIC ASSISTANCE FOR SPINAL PROCEDURE N/A 03/28/2018   Procedure: APPLICATION OF ROBOTIC ASSISTANCE FOR SPINAL PROCEDURE;  Surgeon: Kristeen Miss, MD;   Location: [facility];  Service: Neurosurgery;  Laterality: N/A;  . BACK SURGERY  02/14/12   lumbar OR #7; "today redid L1L2; replaced screws; added bone from hip"  . BILATERAL KNEE ARTHROSCOPY    . COLONOSCOPY  10/15/2008   Dr. [name]: tubular adenoma   . COLONOSCOPY  12/17/2003   XYI:AXKPVV rectal and colon  . COLONOSCOPY N/A 09/05/2014   Procedure: COLONOSCOPY;  Surgeon: Daneil Dolin, MD;  [facility];  Service: Endoscopy;  Laterality: N/A;  7:30-rescheduled 9/17 to [place] notified pt  . CYSTOSCOPY WITH STENT PLACEMENT Right 01/27/2016   Procedure: CYSTOSCOPY WITH STENT PLACEMENT;  Surgeon: [name], MD;  Location: [facility];  Service: Urology;  Laterality: Right;  . CYSTOSCOPY/RETROGRADE/URETEROSCOPY/STONE EXTRACTION WITH BASKET Right 01/27/2016   Procedure: CYSTOSCOPY, RIGHT RETROGRADE, RIGHT URETEROSCOPY, STONE EXTRACTION ;  Surgeon: [name], MD;  Location: [facility];  Service: Urology;  Laterality: Right;  . ESOPHAGOGASTRODUODENOSCOPY  10/15/2008     Dr Rourk:Schatzki's ring status post dilation and disruption via 42 F Maloney dilator/ otherwise unremarkable esophagus, small hiatal hernia, multiple fundal gland polyps not manipulated, gastritis, negative H.pylori  . ESOPHAGOGASTRODUODENOSCOPY  06/21/02   [facility]:OLMBE sliding hiatal hernia with mild changes of reflux esophagitis limited to gastroesophageal junction.  Noncritical ring at distal esophagus, 3  cm proximal to gastroesophageal junction/Antral gastritis  . [place]   "broke face playing softball"  . FRACTURE SURGERY     "left wrist; broke it; took spur off"  . HOLMIUM LASER APPLICATION Right 02/25/9372   Procedure: HOLMIUM LASER APPLICATION;  Surgeon: [name], MD;  Location: [facility];  Service: Urology;  Laterality: Right;  . KNEE ARTHROSCOPY Right 05/18/2018   Procedure: PARTIAL MEDIAL MENISECTOMY AND SURGICAL LAVAGE AND CHONDROPLASTY;  Surgeon: [name], MD;  Location: [facility];  Service: Orthopedics;  Laterality: Right;  . LEFT HEART CATHETERIZATION WITH CORONARY ANGIOGRAM N/A 12/27/2013   Procedure: LEFT HEART CATHETERIZATION WITH CORONARY ANGIOGRAM;  Surgeon: [name], MD;  Location: [facility] C[facility];  Service: Cardiovascular;  Laterality: N/A;  . neck epidural    . POSTERIOR LUMBAR FUSION 4 LEVEL N/A 03/28/2018   Procedure: Thoracic eight -Lumbar two- FIXATION WITH SCREW PLACEMENT, DECOMPRESSION Thoracic ten-Thoracic eleven  FOR OSTEOMYELITIS;  Surgeon: Kristeen Miss, MD;  Location: [facility];  Service: Neurosurgery;  Laterality: N/A;  . SHOULDER SURGERY Bilateral   . TEE WITHOUT CARDIOVERSION N/A 03/21/2018   Procedure: TRANSESOPHAGEAL ECHOCARDIOGRAM (TEE) WITH PROPOFOL;  Surgeon: [name], MD;  [facility];  Service: Cardiovascular;  Laterality: N/A;    There were no vitals filed for this visit.  Subjective Assessment - 10/16/18 1635    Subjective  Patient reports he is feeling good and denies any more falls. He reports he had a pleasant weekend and denies pain.    Pertinent History  T10-11 Epidural Abscess, back surgery on 03/28/18, right knee partial medial menisectomy and chondroplasty    Limitations  Standing;Walking;House hold activities;Lifting    Diagnostic tests  CT Lumbar Spine 05/12/18: "Spinal stimulator device, along with multilevel multisegment fusion hardware appear appropriately placed without adverse features."    Patient Stated Goals  To walk    Currently in Pain?  No/denies       Transfers:  Squat pivot transfer performed 2x with set up assistance and Mod I level from manual wheelchair to pool chair lift. Patient utilized pool chair lift to get in/out of pool for therapy session.   Adult Aquatic Therapy - 10/16/18 1637      Aquatic Therapy Subjective   Subjective  Patient reports he is feeling good and denies any more falls. He reports he had a pleasant weekend and denies pain.      Treatment   Gait  5x 15'  forward/backward gait, with floatation belt and bil UE support on multicolor long hand. 2 bouts with 2x HHA for ~20'.    Exercises  Squat 1x 20 reps. Heel raises with 3 sec holds, 1x20 reps. Back leg pull in plank position on wall for support, 1x 15 reps bil LE's. 1x 20 reps hip abduction with UE support at wall.    Specific Exercises  Hip/Low Back    Hip/Low Back  2x 5 reps bil LE: platform step up with UE support, step up to stand (16-18" platform height)        PT Education - 10/16/18 1646    Education Details  Edcuated on safety with step up in pool and on pattern to sequence exercise.    Person(s) Educated  Patient    Methods  Explanation    Comprehension  Verbalized understanding       PT Short Term Goals - 10/13/18 0904      PT SHORT TERM GOAL #1   Title  Patient  will report understanding and regular compliance with HEP to improve strength and overall functional mobility.     Baseline  09/13/18: Patient stated he has not been doing his exercises at home as often as he should.     Time  4    Period  Weeks    Status  Partially Met      PT SHORT TERM GOAL #2   Title  Patient will demonstrate improvement of 1/2 MMT strength grade in all muscle groups tested to assist patient with safe transfers, ambulation, and standing.     Baseline  09/13/18: Patient demonstrated improvement in some, but not all muscle groups tested    Time  4    Period  Weeks    Status  Partially Met      PT SHORT TERM GOAL #3   Title  Patient will report or demonstrate ability to perform ambulation for 2 minutes with LRAD with no more than supervision assistance without rest break and reported no more than minimal difficulty.     Baseline  09/13/18: Patient is able to ambulate for about 1 minute before requiring a rest break with minimal assistance and RW.      Time  4    Period  Weeks    Status  On-going      PT SHORT TERM GOAL #4   Title  Patient will demonstrate ability to ambulate 60 feet without rest  break using LRAD with no more than minimal assistance.    Time  2    Period  Weeks    Status  New        PT Long Term Goals - 10/13/18 1761      PT LONG TERM GOAL #1   Title  Patient will report or demonstrate ability to perform ambulation for 5 minutes with LRAD with no more than supervision assistance without rest break and reported no more than minimal difficulty.     Baseline  09/13/18: Patient is able to ambulate for about 1 minute before requiring a rest break with minimal assistance and RW.      Time  8    Period  Weeks    Status  On-going      PT LONG TERM GOAL #2   Title  Patient will report ability to stand at a support surface for 10 minutes without a rest break in order to perform household chores with increased ease.     Baseline  10/25: 3-20mns    Time  8    Period  Weeks    Status  On-going      PT LONG TERM GOAL #3   Title  Patient will demonstrate ability to ambulate 120 feet without rest break using LRAD with no more than minimal assistance.    Baseline  Old goal: Patient will demonstrate an improvement of 10% on FOTO indicating improvement in percieved functional mobility. 09/13/18: Patient's FOTO score was found to be 46%.     Time  4    Period  Weeks    Status  Revised         Plan - 10/16/18 1647    Clinical Impression Statement  Patient progress in aquatic setting this date and initiated step up training with platform in water. He required min assist to prevent LOB and cues for sequencing and safety with step up but demonstrated improved ability to maintain upright posture and improved LE strength. He relied on UE support to initiate step up. Gait  training was continued with UE support on long pool float bar and he had improved step pattern with decreased scissoring compared to last week, likely as patient did not appear as fatigued. He continued with functional LE strengthening in squats and hip strengthening and will continued to benefit from skilled PT  interventions to address impairments in aquatic and land-based setting to improve QOL and independence at home.     Rehab Potential  Fair    Clinical Impairments Affecting Rehab Potential  Positive: Highly motivated; Negative: Comorbidities    PT Frequency  3x / week    PT Duration  6 weeks    PT Treatment/Interventions  ADLs/Self Care Home Management;Aquatic Therapy;DME Instruction;Gait training;Functional mobility training;Stair training;Therapeutic activities;Therapeutic exercise;Balance training;Neuromuscular re-education;Patient/family education;Orthotic Fit/Training;Wheelchair mobility training;Manual techniques;Passive range of motion;Energy conservation;Dry needling;Splinting;Taping    PT Next Visit Plan  Continue BWSS and over ground gait training. Work on ambulation, transfers, and functional lower extremity strengthening. Add gastroc stretch and continue with hamstring stretch. Continue with gait training in aquatic setting and LE strengthening. Initiate step-ups next session.    PT Home Exercise Plan  08/16/18:  seated DF/PF, LAQ, marching, abd with theraband and bridge    Consulted and Agree with Plan of Care  Patient       Patient will benefit from skilled therapeutic intervention in order to improve the following deficits and impairments:  Abnormal gait, Decreased balance, Decreased endurance, Decreased mobility, Difficulty walking, Hypomobility, Decreased range of motion, Decreased activity tolerance, Decreased strength, Impaired flexibility  Visit Diagnosis: Other abnormalities of gait and mobility  Other symptoms and signs involving the musculoskeletal system  Muscle weakness (generalized)     Problem List Patient Active Problem List   Diagnosis Date Noted  . Pseudogout of knee   . Pyogenic arthritis of right knee joint ([place])   . Falls frequently 05/08/2018  . Fall   . Sleep disturbance   . Post-op pain   . Hypokalemia   . Morbid obesity ([place])   . Postoperative  pain   . DVT, lower extremity, distal, acute, bilateral ([place])   . Acute blood loss anemia   . Anemia of chronic disease   . Essential hypertension   . Bacteremia   . Myelopathy ([place]) 03/30/2018  . Paraplegia ([place])   . Postlaminectomy syndrome, lumbar region   . Epidural abscess   . Abdominal distension   . Encephalopathy   . Weakness of both lower extremities   . Discitis of thoracic region   . Spinal cord stimulator status   . Staphylococcus aureus sepsis ([place]) 03/20/2018  . Bacteremia due to methicillin susceptible Staphylococcus aureus (MSSA) 03/18/2018  . Obesity 03/18/2018  . Nephrolithiasis 03/16/2018  . AKI (acute kidney injury) ([place]) 03/16/2018  . Cognitive impairment 03/16/2018  . Chronic pain syndrome 03/16/2018  . B12 deficiency 01/28/2016  . Memory difficulty 07/23/2015  . History of cerebrovascular disease 07/23/2015  . FH: colon cancer 08/13/2014  . Hx of adenomatous colonic polyps 08/13/2014  . Chronic back pain 05/30/2012  . CAD, NATIVE VESSEL 07/17/2010  . HYPERCHOLESTEROLEMIA 10/31/2009  . SMOKELESS TOBACCO ABUSE 10/31/2009  . Obstructive sleep apnea 10/31/2009  . Essential hypertension, benign 10/31/2009  . GERD 10/31/2009    Kipp Brood, PT, DPT Physical Therapist with [facility]  10/16/2018 4:53 PM    [place], [place], [address] Phone: [phone]   Fax:  [phone]  Name: Cody Velez MRN: [id number] Date of Birth:  03-12-1946

## 2018-10-17 ENCOUNTER — Ambulatory Visit (INDEPENDENT_AMBULATORY_CARE_PROVIDER_SITE_OTHER): Payer: Medicare Other | Admitting: Internal Medicine

## 2018-10-17 ENCOUNTER — Encounter: Payer: Self-pay | Admitting: Internal Medicine

## 2018-10-17 DIAGNOSIS — I2510 Atherosclerotic heart disease of native coronary artery without angina pectoris: Secondary | ICD-10-CM

## 2018-10-17 DIAGNOSIS — M4644 Discitis, unspecified, thoracic region: Secondary | ICD-10-CM

## 2018-10-17 NOTE — Assessment & Plan Note (Signed)
He continues to improve slowly on therapy for MSSA thoracic discitis.  His inflammatory markers have returned to normal.  We had another long discussion about management options including stopping antibiotics now, continuing cephalexin alone or continuing both cephalexin and rifampin.  As before, he prefers to continue his current 2 drug regimen.  He will follow-up in 3 months.

## 2018-10-17 NOTE — Progress Notes (Signed)
[facility] for Infectious Disease  Patient Active Problem List   Diagnosis Date Noted  . Pseudogout of knee     Priority: High  . Discitis of thoracic region     Priority: High  . Bacteremia due to methicillin susceptible Staphylococcus aureus (MSSA) 03/18/2018    Priority: High  . Pyogenic arthritis of right knee joint ([place])   . Falls frequently 05/08/2018  . Fall   . Sleep disturbance   . Post-op pain   . Hypokalemia   . Morbid obesity ([place])   . Postoperative pain   . DVT, lower extremity, distal, acute, bilateral ([place])   . Acute blood loss anemia   . Anemia of chronic disease   . Essential hypertension   . Bacteremia   . Myelopathy ([place]) 03/30/2018  . Paraplegia ([place])   . Postlaminectomy syndrome, lumbar region   . Epidural abscess   . Abdominal distension   . Encephalopathy   . Weakness of both lower extremities   . Spinal cord stimulator status   . Staphylococcus aureus sepsis ([place]) 03/20/2018  . Obesity 03/18/2018  . Nephrolithiasis 03/16/2018  . AKI (acute kidney injury) ([place]) 03/16/2018  . Cognitive impairment 03/16/2018  . Chronic pain syndrome 03/16/2018  . B12 deficiency 01/28/2016  . Memory difficulty 07/23/2015  . History of cerebrovascular disease 07/23/2015  . FH: colon cancer 08/13/2014  . Hx of adenomatous colonic polyps 08/13/2014  . Chronic back pain 05/30/2012  . CAD, NATIVE VESSEL 07/17/2010  . HYPERCHOLESTEROLEMIA 10/31/2009  . SMOKELESS TOBACCO ABUSE 10/31/2009  . Obstructive sleep apnea 10/31/2009  . Essential hypertension, benign 10/31/2009  . GERD 10/31/2009    Patient's Medications  New Prescriptions   No medications on file  Previous Medications   AMLODIPINE (NORVASC) 10 MG TABLET    Take 1 tablet (10 mg total) by mouth daily.   ASPIRIN 81 MG TABLET    Take 1 tablet (81 mg total) by mouth daily.   BACLOFEN (LIORESAL) 10 MG TABLET    TAKE ONE TABLET IN THE MORNING AND MIDDAY, THEN TWO TABLETS AT NIGHT   BACLOFEN 5  MG TABS    Take 5 mg by mouth 3 (three) times daily.   CEPHALEXIN (KEFLEX) 500 MG CAPSULE    TAKE ONE CAPSULE (500MG  TOTAL) BY MOUTH THREE TIMES DAILY   COLCHICINE 0.6 MG TABLET    Take 1 tablet (0.6 mg total) by mouth daily.   CYCLOSPORINE (RESTASIS) 0.05 % OPHTHALMIC EMULSION    1 DROP INTO BOTH EYES TWICE A DAY FOR DRY EYES   DALTEPARIN SODIUM (FRAGMIN) 50932 UNT/0.72ML SOLN    Inject 0.72 mLs into the skin.   DIAZEPAM (VALIUM) 5 MG TABLET    Take 1 tablet (5 mg total) by mouth every 6 (six) hours as needed for muscle spasms.   DOCUSATE SODIUM (COLACE) 100 MG CAPSULE    Take 2 capsules (200 mg total) by mouth 2 (two) times daily.   DONEPEZIL (ARICEPT) 10 MG TABLET    Take 1 tablet (10 mg total) by mouth at bedtime.   FINASTERIDE (PROSCAR) 5 MG TABLET    Take 1 tablet (5 mg total) by mouth daily. Reported on 05/04/2016   FLUTICASONE (FLONASE) 50 MCG/ACT NASAL SPRAY    Place 1 spray into both nostrils daily as needed for allergies.   GABAPENTIN (NEURONTIN) 400 MG CAPSULE    Take 1 capsule (400 mg total) by mouth 3 (three) times daily.   LATANOPROST (XALATAN)  0.005 % OPHTHALMIC SOLUTION    Place 1 drop into both eyes at bedtime.   LOSARTAN (COZAAR) 100 MG TABLET    Take 100 mg by mouth daily.   MELATONIN 3 MG TABS    Take 0.5 tablets (1.5 mg total) by mouth at bedtime.   MEMANTINE (NAMENDA) 10 MG TABLET    Take 1 tablet (10 mg total) by mouth 2 (two) times daily.   OMEPRAZOLE (PRILOSEC) 20 MG CAPSULE    Take 2 capsules (40 mg total) by mouth daily.   OXYCODONE HCL 10 MG TABS    Take 1 tablet (10 mg total) by mouth every 4 (four) hours as needed (moderate to severe pain).   POLYETHYLENE GLYCOL (MIRALAX / GLYCOLAX) PACKET    Take 17 g by mouth daily.   POTASSIUM CHLORIDE SA (K-DUR,KLOR-CON) 20 MEQ TABLET    Take 1 tablet (20 mEq total) by mouth daily.   PRAVASTATIN (PRAVACHOL) 10 MG TABLET    Take 1 tablet (10 mg total) by mouth every evening.   RIFAMPIN (RIFADIN) 300 MG CAPSULE    Take 1 capsule  (300 mg total) by mouth every 12 (twelve) hours. Continue through 06/18/2018 and then to be determined by ID clinic   TRAZODONE (DESYREL) 100 MG TABLET    Take 1 tablet (100 mg total) by mouth at bedtime.   VENLAFAXINE XR (EFFEXOR-XR) 150 MG 24 HR CAPSULE    Take 1 capsule (150 mg total) by mouth 2 (two) times daily.   VITAMIN B-12 (CYANOCOBALAMIN) 1000 MCG TABLET    Take 1 tablet (1,000 mcg total) by mouth daily.  Modified Medications   No medications on file  Discontinued Medications   No medications on file    Subjective: Cody Velez is in for his routine follow-up visit.  He is accompanied by his wife.  In late March he developed MSSA bacteremia complicated by thoracic discitis and lower extremity weakness.  He underwent incision and drainage and thoracolumbar fusion by Dr. Ellene Route.  He completed a long course of IV antibiotics and then converted to oral cephalexin and rifampin.  He has now completed 7 months of therapy.  He denies any problems tolerating his antibiotics.  His back pain is much improved and he is not requiring prescription pain medication.  He is making slow progress with physical therapy.  He notes improved sensation in his legs.  He has started water therapy which he enjoys very much.  He hopes to walk again.  However, he and his wife both admit that he is somewhat lazy when it comes to doing therapy on his own.  Review of Systems: Review of Systems  Constitutional: Negative for chills, diaphoresis and fever.  Gastrointestinal: Negative for abdominal pain, diarrhea, nausea and vomiting.  Musculoskeletal: Positive for back pain.  Neurological: Positive for sensory change and focal weakness.    Past Medical History:  Diagnosis Date  . Allergic rhinitis   . Arthritis   . B12 deficiency   . Cervicogenic headache 01/28/2016  . Childhood asthma   . Chronic back pain   . Coronary atherosclerosis of native coronary artery    Mild nonobstructive CAD at catheterization January  2015  . Depression   . Essential hypertension   . Falls   . GERD (gastroesophageal reflux disease)   . History of cerebrovascular disease 07/23/2015  . History of pneumonia 02/2011  . Hyperlipidemia   . Kidney stone   . Memory difficulty 07/23/2015  . OSA (obstructive sleep apnea)  CPAP - Dr. [name]  . Prostate cancer ([place])   . PTSD (post-traumatic stress disorder)    [place]  . Rectal bleeding     Social History   Tobacco Use  . Smoking status: Former Smoker    Types: Cigarettes    Last attempt to quit: 12/20/1958    Years since quitting: 59.8  . Smokeless tobacco: Former Systems developer    Types: Chew  . Tobacco comment: Quit chewing 04/2015  Substance Use Topics  . Alcohol use: Yes    Alcohol/week: 0.0 standard drinks    Comment: couple bottles of wine per month  . Drug use: No    Types: Marijuana    Comment: "last used marijuana ~ 1969"    Family History  Problem Relation Age of Onset  . Emphysema Father   . Heart failure Father   . Lung cancer Father   . CAD Father   . Colon cancer Mother   . Stroke Mother   . Breast cancer Mother   . Stroke Sister   . Heart attack Brother   . Dementia Paternal Uncle   . Emphysema Maternal Grandmother   . Stroke Maternal Grandmother   . Asthma Other        grandson  . Heart disease Paternal Grandfather   . Anesthesia problems Neg Hx   . Hypotension Neg Hx   . Malignant hyperthermia Neg Hx   . Pseudochol deficiency Neg Hx     No Known Allergies  Objective: Vitals:   10/17/18 1340  BP: 119/80  Pulse: (!) 48  Temp: 99.1 F (37.3 C)   There is no height or weight on file to calculate BMI.  Physical Exam  Constitutional: He is oriented to person, place, and time.  He is in very good spirits as usual.  He is seated in his wheelchair.  Abdominal: Soft. He exhibits no distension. There is no tenderness.  Musculoskeletal:  His back incision is well-healed.  There are no signs of inflammation or infection.  Neurological: He is  alert and oriented to person, place, and time.  Skin: No rash noted.    Lab Results Sed Rate (mm/h)  Date Value  08/30/2018 19  07/18/2018 33 (H)  06/06/2018 72 (H)   CRP (mg/L)  Date Value  08/30/2018 2.7  07/18/2018 7.0  06/06/2018 19.5 (H)     Problem List Items Addressed This Visit      High   Discitis of thoracic region    He continues to improve slowly on therapy for MSSA thoracic discitis.  His inflammatory markers have returned to normal.  We had another long discussion about management options including stopping antibiotics now, continuing cephalexin alone or continuing both cephalexin and rifampin.  As before, he prefers to continue his current 2 drug regimen.  He will follow-up in 3 months.          Cody Bickers, MD [facility] for Infectious [facility] [phone] pager   [phone] cell 10/17/2018, 1:57 PM

## 2018-10-18 ENCOUNTER — Ambulatory Visit (HOSPITAL_COMMUNITY): Payer: Medicare Other | Admitting: Physical Therapy

## 2018-10-18 ENCOUNTER — Encounter (HOSPITAL_COMMUNITY): Payer: Self-pay | Admitting: Physical Therapy

## 2018-10-18 DIAGNOSIS — R29898 Other symptoms and signs involving the musculoskeletal system: Secondary | ICD-10-CM | POA: Diagnosis not present

## 2018-10-18 DIAGNOSIS — R2689 Other abnormalities of gait and mobility: Secondary | ICD-10-CM | POA: Diagnosis not present

## 2018-10-18 DIAGNOSIS — M6281 Muscle weakness (generalized): Secondary | ICD-10-CM | POA: Diagnosis not present

## 2018-10-18 NOTE — Therapy (Signed)
[place] [place], [place], [address] Phone: [phone]   Fax:  [phone]  Physical Therapy Treatment  Patient Details  Name: RASHAD AULD MRN: [id number] Date of Birth: [birth year] Referring Provider (PT): Asencion Noble, MD   Encounter Date: 10/18/2018  PT End of Session - 10/18/18 0909    Visit Number  23    Number of Visits  43    Date for PT Re-Evaluation  11/24/18   Minireassess 11/03/18   Authorization Type  Primary: Medicare; Secondary: [employer] (75 visits per calendar year with 14 used at evaluation)    Authorization Time Period  08/15/18 - 10/14/18; NEW: 10/13/18 to 11/24/18    Authorization - Visit Number  29   KX   Authorization - Number of Visits  75    PT Start Time  0900    PT Stop Time  0943    PT Time Calculation (min)  43 min    Equipment Utilized During Treatment  Gait belt    Activity Tolerance  Patient tolerated treatment well    Behavior During Therapy  WFL for tasks assessed/performed       Past Medical History:  Diagnosis Date  . Allergic rhinitis   . Arthritis   . B12 deficiency   . Cervicogenic headache 01/28/2016  . Childhood asthma   . Chronic back pain   . Coronary atherosclerosis of native coronary artery    Mild nonobstructive CAD at catheterization January 2015  . Depression   . Essential hypertension   . Falls   . GERD (gastroesophageal reflux disease)   . History of cerebrovascular disease 07/23/2015  . History of pneumonia 02/2011  . Hyperlipidemia   . Kidney stone   . Memory difficulty 07/23/2015  . OSA (obstructive sleep apnea)    CPAP - Dr. [name]  . Prostate cancer ([place])   . PTSD (post-traumatic stress disorder)    [place]  . Rectal bleeding     Past Surgical History:  Procedure Laterality Date  . APPLICATION OF ROBOTIC ASSISTANCE FOR SPINAL PROCEDURE N/A 03/28/2018   Procedure: APPLICATION OF ROBOTIC ASSISTANCE FOR SPINAL PROCEDURE;  Surgeon: Kristeen Miss, MD;   Location: [facility];  Service: Neurosurgery;  Laterality: N/A;  . BACK SURGERY  02/14/12   lumbar OR #7; "today redid L1L2; replaced screws; added bone from hip"  . BILATERAL KNEE ARTHROSCOPY    . COLONOSCOPY  10/15/2008   Dr. [name]: tubular adenoma   . COLONOSCOPY  12/17/2003   TRV:UYEBXI rectal and colon  . COLONOSCOPY N/A 09/05/2014   Procedure: COLONOSCOPY;  Surgeon: Daneil Dolin, MD;  [facility];  Service: Endoscopy;  Laterality: N/A;  7:30-rescheduled 9/17 to [place] notified pt  . CYSTOSCOPY WITH STENT PLACEMENT Right 01/27/2016   Procedure: CYSTOSCOPY WITH STENT PLACEMENT;  Surgeon: [name], MD;  Location: [facility];  Service: Urology;  Laterality: Right;  . CYSTOSCOPY/RETROGRADE/URETEROSCOPY/STONE EXTRACTION WITH BASKET Right 01/27/2016   Procedure: CYSTOSCOPY, RIGHT RETROGRADE, RIGHT URETEROSCOPY, STONE EXTRACTION ;  Surgeon: [name], MD;  Location: [facility];  Service: Urology;  Laterality: Right;  . ESOPHAGOGASTRODUODENOSCOPY  10/15/2008     Dr Rourk:Schatzki's ring status post dilation and disruption via 16 F Maloney dilator/ otherwise unremarkable esophagus, small hiatal hernia, multiple fundal gland polyps not manipulated, gastritis, negative H.pylori  . ESOPHAGOGASTRODUODENOSCOPY  06/21/02   DH[facility]:YSHUO sliding hiatal hernia with mild changes of reflux esophagitis limited to gastroesophageal junction.  Noncritical ring at distal esophagus, 3 cm  proximal to gastroesophageal junction/Antral gastritis  . [place]   "broke face playing softball"  . FRACTURE SURGERY     "left wrist; broke it; took spur off"  . HOLMIUM LASER APPLICATION Right 01/21/9797   Procedure: HOLMIUM LASER APPLICATION;  Surgeon: [name], MD;  Location: [facility];  Service: Urology;  Laterality: Right;  . KNEE ARTHROSCOPY Right 05/18/2018   Procedure: PARTIAL MEDIAL MENISECTOMY AND SURGICAL LAVAGE AND CHONDROPLASTY;  Surgeon: [name], MD;  Location: [facility];  Service: Orthopedics;  Laterality: Right;  . LEFT HEART CATHETERIZATION WITH CORONARY ANGIOGRAM N/A 12/27/2013   Procedure: LEFT HEART CATHETERIZATION WITH CORONARY ANGIOGRAM;  Surgeon: [name], MD;  Location[facility] C[facility];  Service: Cardiovascular;  Laterality: N/A;  . neck epidural    . POSTERIOR LUMBAR FUSION 4 LEVEL N/A 03/28/2018   Procedure: Thoracic eight -Lumbar two- FIXATION WITH SCREW PLACEMENT, DECOMPRESSION Thoracic ten-Thoracic eleven  FOR OSTEOMYELITIS;  Surgeon: Kristeen Miss, MD;  Location: [facility];  Service: Neurosurgery;  Laterality: N/A;  . SHOULDER SURGERY Bilateral   . TEE WITHOUT CARDIOVERSION N/A 03/21/2018   Procedure: TRANSESOPHAGEAL ECHOCARDIOGRAM (TEE) WITH PROPOFOL;  Surgeon: [name], MD;  [facility];  Service: Cardiovascular;  Laterality: N/A;    There were no vitals filed for this visit.  Subjective Assessment - 10/18/18 0907    Subjective  Patient stated he is tired, but that he does not have any pain.     Pertinent History  T10-11 Epidural Abscess, back surgery on 03/28/18, right knee partial medial menisectomy and chondroplasty    Limitations  Standing;Walking;House hold activities;Lifting    Diagnostic tests  CT Lumbar Spine 05/12/18: "Spinal stimulator device, along with multilevel multisegment fusion hardware appear appropriately placed without adverse features."    Patient Stated Goals  To walk    Currently in Pain?  No/denies                       [facility] Adult PT Treatment/Exercise - 10/18/18 0001      Ambulation/Gait   Ambulation/Gait  Yes    Ambulation/Gait Assistance  4: Min assist;3: Mod assist    Ambulation/Gait Assistance Details  W/C follow for safety    Ambulation Distance (Feet)  252 Feet   64 ft, 82 ft, 106 ft   Assistive device  Rolling walker    Gait Pattern  Decreased dorsiflexion - left;Decreased dorsiflexion - right;Trunk flexed;Decreased step length - right;Decreased step length -  left;Step-through pattern    Gait Comments  Patient's bilateral knees buckled intermittently throughout ambulation      Exercises   Exercises  Knee/Hip      Knee/Hip Exercises: Stretches   Passive Hamstring Stretch  3 reps;20 seconds    Passive Hamstring Stretch Limitations  seated stretch on 8'' box      Knee/Hip Exercises: Standing   Hip Extension  Stengthening;Right;Left;1 set;Knee bent;Other (comment)    Functional Squat  1 set    Functional Squat Limitations  5 reps bilateral UE support and min guard      Knee/Hip Exercises: Seated   Other Seated Knee/Hip Exercises  Seated marching over 6'' hurdle x 10 each LE, intermittent foot contact with hurdle             PT Education - 10/18/18 0908    Education Details  Educated on purpose and technique of interventions throughout session.     Person(s) Educated  Patient    Methods  Explanation    Comprehension  Verbalized understanding;Returned demonstration;Tactile cues required       PT Short Term Goals - 10/18/18 1043      PT SHORT TERM GOAL #1   Title  Patient will report understanding and regular compliance with HEP to improve strength and overall functional mobility.     Baseline  09/13/18: Patient stated he has not been doing his exercises at home as often as he should.     Time  4    Period  Weeks    Status  Partially Met      PT SHORT TERM GOAL #2   Title  Patient will demonstrate improvement of 1/2 MMT strength grade in all muscle groups tested to assist patient with safe transfers, ambulation, and standing.     Baseline  09/13/18: Patient demonstrated improvement in some, but not all muscle groups tested    Time  4    Period  Weeks    Status  Partially Met      PT SHORT TERM GOAL #3   Title  Patient will report or demonstrate ability to perform ambulation for 2 minutes with LRAD with no more than supervision assistance without rest break and reported no more than minimal difficulty.     Baseline  09/13/18:  Patient is able to ambulate for about 1 minute before requiring a rest break with minimal assistance and RW.      Time  4    Period  Weeks    Status  On-going      PT SHORT TERM GOAL #4   Title  Patient will demonstrate ability to ambulate 60 feet without rest break using LRAD with no more than minimal assistance.    Time  2    Period  Weeks    Status  Achieved        PT Long Term Goals - 10/18/18 1043      PT LONG TERM GOAL #1   Title  Patient will report or demonstrate ability to perform ambulation for 5 minutes with LRAD with no more than supervision assistance without rest break and reported no more than minimal difficulty.     Baseline  09/13/18: Patient is able to ambulate for about 1 minute before requiring a rest break with minimal assistance and RW.      Time  8    Period  Weeks    Status  On-going      PT LONG TERM GOAL #2   Title  Patient will report ability to stand at a support surface for 10 minutes without a rest break in order to perform household chores with increased ease.     Baseline  10/25: 3-83mns    Time  8    Period  Weeks    Status  On-going      PT LONG TERM GOAL #3   Title  Patient will demonstrate ability to ambulate 120 feet without rest break using LRAD with no more than minimal assistance.    Baseline  Old goal: Patient will demonstrate an improvement of 10% on FOTO indicating improvement in percieved functional mobility. 09/13/18: Patient's FOTO score was found to be 46%.     Time  4    Period  Weeks    Status  On-going            Plan - 10/18/18 1050    Clinical Impression Statement  This session continued with a focus on functional mobility and lower extremity  strengthening. This session patient performed some standing hip extension and mini squats with bilateral upper extremity assistance inside of parallel bars and with minimal guard of therapist. This session patient's furthest continuous ambulation distance was 106 feet. Patient  continued to have knee buckling in each knee intermittently throughout ambulation. This session also had patient perform seated marching over a 6-inch hurdle to improve hip flexion and foot clearance/placement. Plan to continue with progression of functional mobility and lower extremity strengthening.     Rehab Potential  Fair    Clinical Impairments Affecting Rehab Potential  Positive: Highly motivated; Negative: Comorbidities    PT Frequency  3x / week    PT Duration  6 weeks    PT Treatment/Interventions  ADLs/Self Care Home Management;Aquatic Therapy;DME Instruction;Gait training;Functional mobility training;Stair training;Therapeutic activities;Therapeutic exercise;Balance training;Neuromuscular re-education;Patient/family education;Orthotic Fit/Training;Wheelchair mobility training;Manual techniques;Passive range of motion;Energy conservation;Dry needling;Splinting;Taping    PT Next Visit Plan  Continue over ground gait training. Work on ambulation, transfers, and functional lower extremity strengthening. Add gastroc stretch and continue with hamstring stretch. Continue with gait training in aquatic setting and LE strengthening.     PT Home Exercise Plan  08/16/18:  seated DF/PF, LAQ, marching, abd with theraband and bridge    Consulted and Agree with Plan of Care  Patient       Patient will benefit from skilled therapeutic intervention in order to improve the following deficits and impairments:  Abnormal gait, Decreased balance, Decreased endurance, Decreased mobility, Difficulty walking, Hypomobility, Decreased range of motion, Decreased activity tolerance, Decreased strength, Impaired flexibility  Visit Diagnosis: Other abnormalities of gait and mobility  Other symptoms and signs involving the musculoskeletal system  Muscle weakness (generalized)     Problem List Patient Active Problem List   Diagnosis Date Noted  . Pseudogout of knee   . Pyogenic arthritis of right knee joint  ([place])   . Falls frequently 05/08/2018  . Fall   . Sleep disturbance   . Post-op pain   . Hypokalemia   . Morbid obesity ([place])   . Postoperative pain   . DVT, lower extremity, distal, acute, bilateral ([place])   . Acute blood loss anemia   . Anemia of chronic disease   . Essential hypertension   . Bacteremia   . Myelopathy ([place]) 03/30/2018  . Paraplegia ([place])   . Postlaminectomy syndrome, lumbar region   . Epidural abscess   . Abdominal distension   . Encephalopathy   . Weakness of both lower extremities   . Discitis of thoracic region   . Spinal cord stimulator status   . Staphylococcus aureus sepsis ([place]) 03/20/2018  . Bacteremia due to methicillin susceptible Staphylococcus aureus (MSSA) 03/18/2018  . Obesity 03/18/2018  . Nephrolithiasis 03/16/2018  . AKI (acute kidney injury) ([place]) 03/16/2018  . Cognitive impairment 03/16/2018  . Chronic pain syndrome 03/16/2018  . B12 deficiency 01/28/2016  . Memory difficulty 07/23/2015  . History of cerebrovascular disease 07/23/2015  . FH: colon cancer 08/13/2014  . Hx of adenomatous colonic polyps 08/13/2014  . Chronic back pain 05/30/2012  . CAD, NATIVE VESSEL 07/17/2010  . HYPERCHOLESTEROLEMIA 10/31/2009  . SMOKELESS TOBACCO ABUSE 10/31/2009  . Obstructive sleep apnea 10/31/2009  . Essential hypertension, benign 10/31/2009  . GERD 10/31/2009   Clarene Critchley PT, DPT 10:55 AM, 10/18/18 [place], [place], [address] Phone: [phone]   Fax:  [phone]  Name: SHIMON TROWBRIDGE MRN: [id number] Date of Birth: 03/28/1946

## 2018-10-20 ENCOUNTER — Ambulatory Visit (HOSPITAL_COMMUNITY): Payer: Medicare Other | Attending: Internal Medicine

## 2018-10-20 ENCOUNTER — Encounter (HOSPITAL_COMMUNITY): Payer: Self-pay

## 2018-10-20 DIAGNOSIS — M6281 Muscle weakness (generalized): Secondary | ICD-10-CM | POA: Diagnosis not present

## 2018-10-20 DIAGNOSIS — R2689 Other abnormalities of gait and mobility: Secondary | ICD-10-CM

## 2018-10-20 DIAGNOSIS — R29898 Other symptoms and signs involving the musculoskeletal system: Secondary | ICD-10-CM | POA: Diagnosis not present

## 2018-10-20 NOTE — Therapy (Signed)
[facility] [place], [place], [address] Phone: [phone]   Fax:  [phone]  Physical Therapy Treatment  Patient Details  Name: Cody Velez MRN: [id number] Date of Birth: Feb 04, 1946 Referring Provider (PT): Asencion Noble, MD   Encounter Date: 10/20/2018  PT End of Session - 10/20/18 1320    Visit Number  24    Number of Visits  29    Date for PT Re-Evaluation  11/24/18   Minireassess 11/03/18   Authorization Type  Primary: Medicare; Secondary: BC[employer] (75 visits per calendar year with 14 used at evaluation)    Authorization Time Period  08/15/18 - 10/14/18; NEW: 10/13/18 to 11/24/18    Authorization - Visit Number  57   KX   Authorization - Number of Visits  76    PT Start Time  4235    PT Stop Time  1114    PT Time Calculation (min)  39 min    Equipment Utilized During Treatment  Gait belt    Activity Tolerance  Patient tolerated treatment well;Patient limited by fatigue    Behavior During Therapy  WFL for tasks assessed/performed       Past Medical History:  Diagnosis Date  . Allergic rhinitis   . Arthritis   . B12 deficiency   . Cervicogenic headache 01/28/2016  . Childhood asthma   . Chronic back pain   . Coronary atherosclerosis of native coronary artery    Mild nonobstructive CAD at catheterization January 2015  . Depression   . Essential hypertension   . Falls   . GERD (gastroesophageal reflux disease)   . History of cerebrovascular disease 07/23/2015  . History of pneumonia 02/2011  . Hyperlipidemia   . Kidney stone   . Memory difficulty 07/23/2015  . OSA (obstructive sleep apnea)    CPAP - Dr. [name]  . Prostate cancer ([place])   . PTSD (post-traumatic stress disorder)    [place]  . Rectal bleeding     Past Surgical History:  Procedure Laterality Date  . APPLICATION OF ROBOTIC ASSISTANCE FOR SPINAL PROCEDURE N/A 03/28/2018   Procedure: APPLICATION OF ROBOTIC ASSISTANCE FOR SPINAL PROCEDURE;  Surgeon:  Kristeen Miss, MD;  Location: [facility];  Service: Neurosurgery;  Laterality: N/A;  . BACK SURGERY  02/14/12   lumbar OR #7; "today redid L1L2; replaced screws; added bone from hip"  . BILATERAL KNEE ARTHROSCOPY    . COLONOSCOPY  10/15/2008   Dr. [name]: tubular adenoma   . COLONOSCOPY  12/17/2003   TIR:WERXVQ rectal and colon  . COLONOSCOPY N/A 09/05/2014   Procedure: COLONOSCOPY;  Surgeon: Daneil Dolin, MD;  [facility];  Service: Endoscopy;  Laterality: N/A;  7:30-rescheduled 9/17 to [facility] notified pt  . CYSTOSCOPY WITH STENT PLACEMENT Right 01/27/2016   Procedure: CYSTOSCOPY WITH STENT PLACEMENT;  Surgeon: [name], MD;  Location: [facility];  Service: Urology;  Laterality: Right;  . CYSTOSCOPY/RETROGRADE/URETEROSCOPY/STONE EXTRACTION WITH BASKET Right 01/27/2016   Procedure: CYSTOSCOPY, RIGHT RETROGRADE, RIGHT URETEROSCOPY, STONE EXTRACTION ;  Surgeon: [name], MD;  Location: [facility];  Service: Urology;  Laterality: Right;  . ESOPHAGOGASTRODUODENOSCOPY  10/15/2008     Dr Rourk:Schatzki's ring status post dilation and disruption via 34 F Maloney dilator/ otherwise unremarkable esophagus, small hiatal hernia, multiple fundal gland polyps not manipulated, gastritis, negative H.pylori  . ESOPHAGOGASTRODUODENOSCOPY  06/21/02   MGQ:QPYPP sliding hiatal hernia with mild changes of reflux esophagitis limited to gastroesophageal junction.  Noncritical ring at distal  esophagus, 3 cm proximal to gastroesophageal junction/Antral gastritis  . [name]   "broke face playing softball"  . FRACTURE SURGERY     "left wrist; broke it; took spur off"  . HOLMIUM LASER APPLICATION Right 08/26/2835   Procedure: HOLMIUM LASER APPLICATION;  Surgeon: [name], MD;  Location: [facility];  Service: Urology;  Laterality: Right;  . KNEE ARTHROSCOPY Right 05/18/2018   Procedure: PARTIAL MEDIAL MENISECTOMY AND SURGICAL LAVAGE AND CHONDROPLASTY;  Surgeon: [name], MD;  Location: [facility];  Service: Orthopedics;  Laterality: Right;  . LEFT HEART CATHETERIZATION WITH CORONARY ANGIOGRAM N/A 12/27/2013   Procedure: LEFT HEART CATHETERIZATION WITH CORONARY ANGIOGRAM;  Surgeon: [name], MD;  Location: [facility];  Service: Cardiovascular;  Laterality: N/A;  . neck epidural    . POSTERIOR LUMBAR FUSION 4 LEVEL N/A 03/28/2018   Procedure: Thoracic eight -Lumbar two- FIXATION WITH SCREW PLACEMENT, DECOMPRESSION Thoracic ten-Thoracic eleven  FOR OSTEOMYELITIS;  Surgeon: Kristeen Miss, MD;  Location: [facility];  Service: Neurosurgery;  Laterality: N/A;  . SHOULDER SURGERY Bilateral   . TEE WITHOUT CARDIOVERSION N/A 03/21/2018   Procedure: TRANSESOPHAGEAL ECHOCARDIOGRAM (TEE) WITH PROPOFOL;  Surgeon: [name], MD;  [facility];  Service: Cardiovascular;  Laterality: N/A;    There were no vitals filed for this visit.  Subjective Assessment - 10/20/18 1324    Subjective  No reports of pain, LE are numb today.      Pertinent History  T10-11 Epidural Abscess, back surgery on 03/28/18, right knee partial medial menisectomy and chondroplasty    Patient Stated Goals  To walk    Currently in Pain?  No/denies                       [facility] Adult PT Treatment/Exercise - 10/20/18 0001      Ambulation/Gait   Ambulation/Gait  Yes    Ambulation/Gait Assistance  4: Min assist;3: Mod assist    Ambulation/Gait Assistance Details  W/C follow for safety    Ambulation Distance (Feet)  252 Feet   3 sets   Assistive device  Rolling walker    Gait Pattern  Decreased dorsiflexion - left;Decreased dorsiflexion - right;Trunk flexed;Decreased step length - right;Decreased step length - left;Step-through pattern    Gait Comments  Patient's bilateral knees buckled intermittently throughout ambulation      Exercises   Exercises  Knee/Hip      Knee/Hip Exercises: Standing   Forward Lunges  Both;5 reps    Forward Lunges Limitations  Rt LE buckled     Functional Squat  2 sets;5 reps    Functional Squat Limitations  Bil UE support    Other Standing Knee Exercises  sidestep in //bars 1RT, cueing for foot placement and upright posture               PT Short Term Goals - 10/18/18 1043      PT SHORT TERM GOAL #1   Title  Patient will report understanding and regular compliance with HEP to improve strength and overall functional mobility.     Baseline  09/13/18: Patient stated he has not been doing his exercises at home as often as he should.     Time  4    Period  Weeks    Status  Partially Met      PT SHORT TERM GOAL #2   Title  Patient will demonstrate improvement of 1/2 MMT strength grade in  all muscle groups tested to assist patient with safe transfers, ambulation, and standing.     Baseline  09/13/18: Patient demonstrated improvement in some, but not all muscle groups tested    Time  4    Period  Weeks    Status  Partially Met      PT SHORT TERM GOAL #3   Title  Patient will report or demonstrate ability to perform ambulation for 2 minutes with LRAD with no more than supervision assistance without rest break and reported no more than minimal difficulty.     Baseline  09/13/18: Patient is able to ambulate for about 1 minute before requiring a rest break with minimal assistance and RW.      Time  4    Period  Weeks    Status  On-going      PT SHORT TERM GOAL #4   Title  Patient will demonstrate ability to ambulate 60 feet without rest break using LRAD with no more than minimal assistance.    Time  2    Period  Weeks    Status  Achieved        PT Long Term Goals - 10/18/18 1043      PT LONG TERM GOAL #1   Title  Patient will report or demonstrate ability to perform ambulation for 5 minutes with LRAD with no more than supervision assistance without rest break and reported no more than minimal difficulty.     Baseline  09/13/18: Patient is able to ambulate for about 1 minute before requiring a rest break with minimal  assistance and RW.      Time  8    Period  Weeks    Status  On-going      PT LONG TERM GOAL #2   Title  Patient will report ability to stand at a support surface for 10 minutes without a rest break in order to perform household chores with increased ease.     Baseline  10/25: 3-3mns    Time  8    Period  Weeks    Status  On-going      PT LONG TERM GOAL #3   Title  Patient will demonstrate ability to ambulate 120 feet without rest break using LRAD with no more than minimal assistance.    Baseline  Old goal: Patient will demonstrate an improvement of 10% on FOTO indicating improvement in percieved functional mobility. 09/13/18: Patient's FOTO score was found to be 46%.     Time  4    Period  Weeks    Status  On-going            Plan - 10/20/18 1329    Clinical Impression Statement  Continued session focus with gait training to improve foot placement, activity tolerance and overall mechanics as well as LE strengthening.  Pt able to complete 115 ft prior need for rest breaks.  Added lunges this session for functional strenghtening, Rt knee buckled requiring max A for safety back to seat.  No reoprts of pain through session, was limited by fatigue.      PT Frequency  3x / week    PT Duration  6 weeks    PT Treatment/Interventions  ADLs/Self Care Home Management;Aquatic Therapy;DME Instruction;Gait training;Functional mobility training;Stair training;Therapeutic activities;Therapeutic exercise;Balance training;Neuromuscular re-education;Patient/family education;Orthotic Fit/Training;Wheelchair mobility training;Manual techniques;Passive range of motion;Energy conservation;Dry needling;Splinting;Taping    PT Next Visit Plan  Continue over ground gait training. Work on ambulation, transfers, and functional lower extremity strengthening.  Add gastroc stretch and continue with hamstring stretch. Continue with gait training in aquatic setting and LE strengthening.     PT Home Exercise Plan   08/16/18:  seated DF/PF, LAQ, marching, abd with theraband and bridge       Patient will benefit from skilled therapeutic intervention in order to improve the following deficits and impairments:  Abnormal gait, Decreased balance, Decreased endurance, Decreased mobility, Difficulty walking, Hypomobility, Decreased range of motion, Decreased activity tolerance, Decreased strength, Impaired flexibility  Visit Diagnosis: Other abnormalities of gait and mobility  Other symptoms and signs involving the musculoskeletal system  Muscle weakness (generalized)     Problem List Patient Active Problem List   Diagnosis Date Noted  . Pseudogout of knee   . Pyogenic arthritis of right knee joint ([place])   . Falls frequently 05/08/2018  . Fall   . Sleep disturbance   . Post-op pain   . Hypokalemia   . Morbid obesity ([place])   . Postoperative pain   . DVT, lower extremity, distal, acute, bilateral ([place])   . Acute blood loss anemia   . Anemia of chronic disease   . Essential hypertension   . Bacteremia   . Myelopathy ([place]) 03/30/2018  . Paraplegia ([place])   . Postlaminectomy syndrome, lumbar region   . Epidural abscess   . Abdominal distension   . Encephalopathy   . Weakness of both lower extremities   . Discitis of thoracic region   . Spinal cord stimulator status   . Staphylococcus aureus sepsis ([place]) 03/20/2018  . Bacteremia due to methicillin susceptible Staphylococcus aureus (MSSA) 03/18/2018  . Obesity 03/18/2018  . Nephrolithiasis 03/16/2018  . AKI (acute kidney injury) ([place]) 03/16/2018  . Cognitive impairment 03/16/2018  . Chronic pain syndrome 03/16/2018  . B12 deficiency 01/28/2016  . Memory difficulty 07/23/2015  . History of cerebrovascular disease 07/23/2015  . FH: colon cancer 08/13/2014  . Hx of adenomatous colonic polyps 08/13/2014  . Chronic back pain 05/30/2012  . CAD, NATIVE VESSEL 07/17/2010  . HYPERCHOLESTEROLEMIA 10/31/2009  . SMOKELESS TOBACCO ABUSE 10/31/2009   . Obstructive sleep apnea 10/31/2009  . Essential hypertension, benign 10/31/2009  . GERD 10/31/2009   Ihor Austin, [place]; [place]  Aldona Lento 10/20/2018, 1:57 PM  [facility] [address] [place], [place], [address] Phone: [phone]   Fax:  [phone]  Name: LATERRANCE NAUTA MRN: [id number] Date of Birth: 1946/10/31

## 2018-10-23 ENCOUNTER — Other Ambulatory Visit: Payer: Self-pay

## 2018-10-23 ENCOUNTER — Encounter (HOSPITAL_COMMUNITY): Payer: Self-pay

## 2018-10-23 ENCOUNTER — Telehealth (HOSPITAL_COMMUNITY): Payer: Self-pay

## 2018-10-23 ENCOUNTER — Ambulatory Visit (HOSPITAL_COMMUNITY): Payer: Medicare Other

## 2018-10-23 DIAGNOSIS — R29898 Other symptoms and signs involving the musculoskeletal system: Secondary | ICD-10-CM

## 2018-10-23 DIAGNOSIS — R2689 Other abnormalities of gait and mobility: Secondary | ICD-10-CM | POA: Diagnosis not present

## 2018-10-23 DIAGNOSIS — M6281 Muscle weakness (generalized): Secondary | ICD-10-CM

## 2018-10-23 NOTE — Telephone Encounter (Signed)
Patient have a MD appt around the same time as his PT appt and needed to cancel.

## 2018-10-23 NOTE — Therapy (Signed)
[place], [place], [address] Phone: [phone]   Fax:  [phone]  Physical Therapy Aquatic Treatment  Patient Details  Name: Cody Velez MRN: [id number] Date of Birth: 04-04-1946 Referring Provider (PT): Asencion Noble, MD   Encounter Date: 10/23/2018  PT End of Session - 10/23/18 1747    Visit Number  25    Number of Visits  43    Date for PT Re-Evaluation  11/24/18   Minireassess 11/03/18   Authorization Type  Primary: Medicare; Secondary: BCBS (75 visits per calendar year with 14 used at evaluation)    Authorization Time Period  08/15/18 - 10/14/18; NEW: 10/13/18 to 11/24/18    Authorization - Visit Number  46   KX   Authorization - Number of Visits  8    PT Start Time  5374    PT Stop Time  1435    PT Time Calculation (min)  52 min    Equipment Utilized During Treatment  Gait belt    Activity Tolerance  Patient tolerated treatment well    Behavior During Therapy  WFL for tasks assessed/performed       Past Medical History:  Diagnosis Date  . Allergic rhinitis   . Arthritis   . B12 deficiency   . Cervicogenic headache 01/28/2016  . Childhood asthma   . Chronic back pain   . Coronary atherosclerosis of native coronary artery    Mild nonobstructive CAD at catheterization January 2015  . Depression   . Essential hypertension   . Falls   . GERD (gastroesophageal reflux disease)   . History of cerebrovascular disease 07/23/2015  . History of pneumonia 02/2011  . Hyperlipidemia   . Kidney stone   . Memory difficulty 07/23/2015  . OSA (obstructive sleep apnea)    CPAP - Dr. [name]  . Prostate cancer ([place])   . PTSD (post-traumatic stress disorder)    [place]  . Rectal bleeding     Past Surgical History:  Procedure Laterality Date  . APPLICATION OF ROBOTIC ASSISTANCE FOR SPINAL PROCEDURE N/A 03/28/2018   Procedure: APPLICATION OF ROBOTIC ASSISTANCE FOR SPINAL PROCEDURE;  Surgeon: Kristeen Miss, MD;   Location: [facility];  Service: Neurosurgery;  Laterality: N/A;  . BACK SURGERY  02/14/12   lumbar OR #7; "today redid L1L2; replaced screws; added bone from hip"  . BILATERAL KNEE ARTHROSCOPY    . COLONOSCOPY  10/15/2008   Dr. [name]: tubular adenoma   . COLONOSCOPY  12/17/2003   MOL:MBEMLJ rectal and colon  . COLONOSCOPY N/A 09/05/2014   Procedure: COLONOSCOPY;  Surgeon: Daneil Dolin, MD;  [facility];  Service: Endoscopy;  Laterality: N/A;  7:30-rescheduled 9/17 to [place] notified pt  . CYSTOSCOPY WITH STENT PLACEMENT Right 01/27/2016   Procedure: CYSTOSCOPY WITH STENT PLACEMENT;  Surgeon: [name], MD;  Location: [facility];  Service: Urology;  Laterality: Right;  . CYSTOSCOPY/RETROGRADE/URETEROSCOPY/STONE EXTRACTION WITH BASKET Right 01/27/2016   Procedure: CYSTOSCOPY, RIGHT RETROGRADE, RIGHT URETEROSCOPY, STONE EXTRACTION ;  Surgeon: [name], MD;  Location: [facility];  Service: Urology;  Laterality: Right;  . ESOPHAGOGASTRODUODENOSCOPY  10/15/2008     Dr Rourk:Schatzki's ring status post dilation and disruption via 47 F Maloney dilator/ otherwise unremarkable esophagus, small hiatal hernia, multiple fundal gland polyps not manipulated, gastritis, negative H.pylori  . ESOPHAGOGASTRODUODENOSCOPY  06/21/02   QGB:EEFEO sliding hiatal hernia with mild changes of reflux esophagitis limited to gastroesophageal junction.  Noncritical ring at distal esophagus, 3  cm proximal to gastroesophageal junction/Antral gastritis  . [place]   "broke face playing softball"  . FRACTURE SURGERY     "left wrist; broke it; took spur off"  . HOLMIUM LASER APPLICATION Right 12/28/1476   Procedure: HOLMIUM LASER APPLICATION;  Surgeon: [name], MD;  Location: [facility];  Service: Urology;  Laterality: Right;  . KNEE ARTHROSCOPY Right 05/18/2018   Procedure: PARTIAL MEDIAL MENISECTOMY AND SURGICAL LAVAGE AND CHONDROPLASTY;  Surgeon: [name], MD;  Location: [facility];  Service: Orthopedics;  Laterality: Right;  . LEFT HEART CATHETERIZATION WITH CORONARY ANGIOGRAM N/A 12/27/2013   Procedure: LEFT HEART CATHETERIZATION WITH CORONARY ANGIOGRAM;  Surgeon: [name], MD;  Location: [facility];  Service: Cardiovascular;  Laterality: N/A;  . neck epidural    . POSTERIOR LUMBAR FUSION 4 LEVEL N/A 03/28/2018   Procedure: Thoracic eight -Lumbar two- FIXATION WITH SCREW PLACEMENT, DECOMPRESSION Thoracic ten-Thoracic eleven  FOR OSTEOMYELITIS;  Surgeon: Kristeen Miss, MD;  Location: [facility];  Service: Neurosurgery;  Laterality: N/A;  . SHOULDER SURGERY Bilateral   . TEE WITHOUT CARDIOVERSION N/A 03/21/2018   Procedure: TRANSESOPHAGEAL ECHOCARDIOGRAM (TEE) WITH PROPOFOL;  Surgeon: [name], MD;  [facility];  Service: Cardiovascular;  Laterality: N/A;    There were no vitals filed for this visit.  Subjective Assessment - 10/23/18 1746    Subjective  Patient reports he had a good weekend, no pain today. He states he has been slacking with his HEP recently and realizes he does need to continue with them.    Pertinent History  T10-11 Epidural Abscess, back surgery on 03/28/18, right knee partial medial menisectomy and chondroplasty    Limitations  Standing;Walking;House hold activities;Lifting    Diagnostic tests  CT Lumbar Spine 05/12/18: "Spinal stimulator device, along with multilevel multisegment fusion hardware appear appropriately placed without adverse features."    Patient Stated Goals  To walk    Currently in Pain?  No/denies        Transfers:  Squat pivot transfer performed 2x with set up assistance and Mod I level from manual wheelchair to pool chair lift. Patient utilized pool chair lift to get in/out of pool for therapy session.    Adult Aquatic Therapy - 10/23/18 1747      Aquatic Therapy Subjective   Subjective  Patient reports he had a good weekend, no pain today. He states he has been slacking with his HEP recently and  realizes he does need to continue with them.      Treatment   Gait  Pt ambulated multiple bouts form 15'-25' throughout session without UE support while wearing float belt. He demonstrated decreased scissoring and was able to regain balance after LOB without assistance 75 of time.    Exercises  Squat 1x 20 reps. Heel raises with 3 sec holds, 1x20 reps. 1x 15 reps bil LE, back leg pull in plank position on seconds step. 1x 15 reps bil LE, front leg pull on second step. 1x 15 reps with yellow pool weight, double arm pull down while maintaining TrA contraction with mini-squat against wall. 1x 15 reps bil LE marching, 3 sec holds, while maintaining TrA contraction with mini-squat against wall.    Specific Exercises  Hip/Low Back    Hip/Low Back  1x 10 reps bil LE: platform step up with UE support, step up to stand (16-18" platform height)    Balance  Ai Chi: 10 reps floating, 10 reps uplifting with rainbow pool  weights, 10 reps gathering, 10 reps freeing. Pt maintained squat and was able to regain balance and shift weight forward after LOB posteriorly to continue with exercises.         PT Education - 10/23/18 1747    Education Details  Reviewed importance of HEP.    Person(s) Educated  Patient    Methods  Explanation    Comprehension  Verbalized understanding       PT Short Term Goals - 10/18/18 1043      PT SHORT TERM GOAL #1   Title  Patient will report understanding and regular compliance with HEP to improve strength and overall functional mobility.     Baseline  09/13/18: Patient stated he has not been doing his exercises at home as often as he should.     Time  4    Period  Weeks    Status  Partially Met      PT SHORT TERM GOAL #2   Title  Patient will demonstrate improvement of 1/2 MMT strength grade in all muscle groups tested to assist patient with safe transfers, ambulation, and standing.     Baseline  09/13/18: Patient demonstrated improvement in some, but not all muscle groups  tested    Time  4    Period  Weeks    Status  Partially Met      PT SHORT TERM GOAL #3   Title  Patient will report or demonstrate ability to perform ambulation for 2 minutes with LRAD with no more than supervision assistance without rest break and reported no more than minimal difficulty.     Baseline  09/13/18: Patient is able to ambulate for about 1 minute before requiring a rest break with minimal assistance and RW.      Time  4    Period  Weeks    Status  On-going      PT SHORT TERM GOAL #4   Title  Patient will demonstrate ability to ambulate 60 feet without rest break using LRAD with no more than minimal assistance.    Time  2    Period  Weeks    Status  Achieved        PT Long Term Goals - 10/18/18 1043      PT LONG TERM GOAL #1   Title  Patient will report or demonstrate ability to perform ambulation for 5 minutes with LRAD with no more than supervision assistance without rest break and reported no more than minimal difficulty.     Baseline  09/13/18: Patient is able to ambulate for about 1 minute before requiring a rest break with minimal assistance and RW.      Time  8    Period  Weeks    Status  On-going      PT LONG TERM GOAL #2   Title  Patient will report ability to stand at a support surface for 10 minutes without a rest break in order to perform household chores with increased ease.     Baseline  10/25: 3-102mns    Time  8    Period  Weeks    Status  On-going      PT LONG TERM GOAL #3   Title  Patient will demonstrate ability to ambulate 120 feet without rest break using LRAD with no more than minimal assistance.    Baseline  Old goal: Patient will demonstrate an improvement of 10% on FOTO indicating improvement in percieved functional mobility. 09/13/18: Patient's FOTO  score was found to be 46%.     Time  4    Period  Weeks    Status  On-going        Plan - 10/23/18 1748    Clinical Impression Statement  Continued with aquatic therapy this date and  patient performed 75% of gait without UE support and corrected LOB independently. He was bale to maintain min-squat position for ~ 15 minutes during Ai Chi poses/movements prior to requiring a break with wall support. He also progressed exercise to incorporate core strength with transverse abdominis activation during marching, arm pull down, and hip strengthening exercises. He continued this date with step up training on platform and continues to require UE support to initiate stand. He will continue to benefit from skilled PT interventions to address impairments in aquatic and land-based setting to improve QOL and independence at home.    PT Frequency  3x / week    PT Duration  6 weeks    PT Treatment/Interventions  ADLs/Self Care Home Management;Aquatic Therapy;DME Instruction;Gait training;Functional mobility training;Stair training;Therapeutic activities;Therapeutic exercise;Balance training;Neuromuscular re-education;Patient/family education;Orthotic Fit/Training;Wheelchair mobility training;Manual techniques;Passive range of motion;Energy conservation;Dry needling;Splinting;Taping    PT Next Visit Plan  Continue over ground gait training. Work on ambulation, transfers, and functional lower extremity strengthening. Add gastroc stretch and continue with hamstring stretch. Continue with gait training in aquatic setting and LE strengthening. Initiate step up training in // bars.    PT Home Exercise Plan  08/16/18:  seated DF/PF, LAQ, marching, abd with theraband and bridge    Consulted and Agree with Plan of Care  Patient       Patient will benefit from skilled therapeutic intervention in order to improve the following deficits and impairments:  Abnormal gait, Decreased balance, Decreased endurance, Decreased mobility, Difficulty walking, Hypomobility, Decreased range of motion, Decreased activity tolerance, Decreased strength, Impaired flexibility  Visit Diagnosis: Other abnormalities of gait and  mobility  Other symptoms and signs involving the musculoskeletal system  Muscle weakness (generalized)     Problem List Patient Active Problem List   Diagnosis Date Noted  . Pseudogout of knee   . Pyogenic arthritis of right knee joint ([place])   . Falls frequently 05/08/2018  . Fall   . Sleep disturbance   . Post-op pain   . Hypokalemia   . Morbid obesity ([place])   . Postoperative pain   . DVT, lower extremity, distal, acute, bilateral ([place])   . Acute blood loss anemia   . Anemia of chronic disease   . Essential hypertension   . Bacteremia   . Myelopathy ([place]) 03/30/2018  . Paraplegia ([place])   . Postlaminectomy syndrome, lumbar region   . Epidural abscess   . Abdominal distension   . Encephalopathy   . Weakness of both lower extremities   . Discitis of thoracic region   . Spinal cord stimulator status   . Staphylococcus aureus sepsis ([place]) 03/20/2018  . Bacteremia due to methicillin susceptible Staphylococcus aureus (MSSA) 03/18/2018  . Obesity 03/18/2018  . Nephrolithiasis 03/16/2018  . AKI (acute kidney injury) ([place]) 03/16/2018  . Cognitive impairment 03/16/2018  . Chronic pain syndrome 03/16/2018  . B12 deficiency 01/28/2016  . Memory difficulty 07/23/2015  . History of cerebrovascular disease 07/23/2015  . FH: colon cancer 08/13/2014  . Hx of adenomatous colonic polyps 08/13/2014  . Chronic back pain 05/30/2012  . CAD, NATIVE VESSEL 07/17/2010  . HYPERCHOLESTEROLEMIA 10/31/2009  . SMOKELESS TOBACCO ABUSE 10/31/2009  . Obstructive sleep apnea 10/31/2009  .  Essential hypertension, benign 10/31/2009  . GERD 10/31/2009    Kipp Brood, PT, DPT Physical Therapist with [facility]  10/23/2018 5:48 PM    [facility] [address] [place], [place], [address] Phone: [phone]   Fax:  [phone]  Name: Cody Velez MRN: [id number] Date of Birth: 1946/09/16

## 2018-10-25 ENCOUNTER — Encounter (HOSPITAL_COMMUNITY): Payer: Self-pay

## 2018-10-25 ENCOUNTER — Other Ambulatory Visit: Payer: Self-pay

## 2018-10-25 ENCOUNTER — Ambulatory Visit (HOSPITAL_COMMUNITY): Payer: Medicare Other

## 2018-10-25 DIAGNOSIS — R2689 Other abnormalities of gait and mobility: Secondary | ICD-10-CM

## 2018-10-25 DIAGNOSIS — M6281 Muscle weakness (generalized): Secondary | ICD-10-CM

## 2018-10-25 DIAGNOSIS — R29898 Other symptoms and signs involving the musculoskeletal system: Secondary | ICD-10-CM | POA: Diagnosis not present

## 2018-10-25 NOTE — Therapy (Signed)
[place], [place], [address] Phone: [phone]   Fax:  [phone]  Physical Therapy Treatment  Patient Details  Name: Cody Velez MRN: [id number] Date of Birth: 1946-02-25 Referring Provider (PT): Asencion Noble, MD   Encounter Date: 10/25/2018  PT End of Session - 10/25/18 1258    Visit Number  26    Number of Visits  80    Date for PT Re-Evaluation  11/24/18   Minireassess 11/03/18   Authorization Type  Primary: Medicare; Secondary: BC[employer] (75 visits per calendar year with 14 used at evaluation)    Authorization Time Period  08/15/18 - 10/14/18; NEW: 10/13/18 to 11/24/18    Authorization - Visit Number  7   KX   Authorization - Number of Visits  75    PT Start Time  5170    PT Stop Time  1342    PT Time Calculation (min)  40 min    Equipment Utilized During Treatment  Gait belt    Activity Tolerance  Patient tolerated treatment well    Behavior During Therapy  WFL for tasks assessed/performed       Past Medical History:  Diagnosis Date  . Allergic rhinitis   . Arthritis   . B12 deficiency   . Cervicogenic headache 01/28/2016  . Childhood asthma   . Chronic back pain   . Coronary atherosclerosis of native coronary artery    Mild nonobstructive CAD at catheterization January 2015  . Depression   . Essential hypertension   . Falls   . GERD (gastroesophageal reflux disease)   . History of cerebrovascular disease 07/23/2015  . History of pneumonia 02/2011  . Hyperlipidemia   . Kidney stone   . Memory difficulty 07/23/2015  . OSA (obstructive sleep apnea)    CPAP - Dr. [name]  . Prostate cancer ([place])   . PTSD (post-traumatic stress disorder)    [place]  . Rectal bleeding     Past Surgical History:  Procedure Laterality Date  . APPLICATION OF ROBOTIC ASSISTANCE FOR SPINAL PROCEDURE N/A 03/28/2018   Procedure: APPLICATION OF ROBOTIC ASSISTANCE FOR SPINAL PROCEDURE;  Surgeon: Kristeen Miss, MD;  Location:  [facility];  Service: Neurosurgery;  Laterality: N/A;  . BACK SURGERY  02/14/12   lumbar OR #7; "today redid L1L2; replaced screws; added bone from hip"  . BILATERAL KNEE ARTHROSCOPY    . COLONOSCOPY  10/15/2008   Dr. [name]: tubular adenoma   . COLONOSCOPY  12/17/2003   [facility] rectal and colon  . COLONOSCOPY N/A 09/05/2014   Procedure: COLONOSCOPY;  Surgeon: Daneil Dolin, MD;  [facility];  Service: Endoscopy;  Laterality: N/A;  7:30-rescheduled 9/17 to [place] notified pt  . CYSTOSCOPY WITH STENT PLACEMENT Right 01/27/2016   Procedure: CYSTOSCOPY WITH STENT PLACEMENT;  Surgeon: [name], MD;  Location: [facility];  Service: Urology;  Laterality: Right;  . CYSTOSCOPY/RETROGRADE/URETEROSCOPY/STONE EXTRACTION WITH BASKET Right 01/27/2016   Procedure: CYSTOSCOPY, RIGHT RETROGRADE, RIGHT URETEROSCOPY, STONE EXTRACTION ;  Surgeon: [name], MD;  Location: [facility];  Service: Urology;  Laterality: Right;  . ESOPHAGOGASTRODUODENOSCOPY  10/15/2008     Dr Rourk:Schatzki's ring status post dilation and disruption via 14 F Maloney dilator/ otherwise unremarkable esophagus, small hiatal hernia, multiple fundal gland polyps not manipulated, gastritis, negative H.pylori  . ESOPHAGOGASTRODUODENOSCOPY  06/21/02   [facility]MB[facility]G sliding hiatal hernia with mild changes of reflux esophagitis limited to gastroesophageal junction.  Noncritical ring at distal esophagus, 3 cm  proximal to gastroesophageal junction/Antral gastritis  . [place]   "broke face playing softball"  . FRACTURE SURGERY     "left wrist; broke it; took spur off"  . HOLMIUM LASER APPLICATION Right 5/0/0370   Procedure: HOLMIUM LASER APPLICATION;  Surgeon: [name], MD;  Location: [facility];  Service: Urology;  Laterality: Right;  . KNEE ARTHROSCOPY Right 05/18/2018   Procedure: PARTIAL MEDIAL MENISECTOMY AND SURGICAL LAVAGE AND CHONDROPLASTY;  Surgeon: [name], MD;  Location: [facility];   Service: Orthopedics;  Laterality: Right;  . LEFT HEART CATHETERIZATION WITH CORONARY ANGIOGRAM N/A 12/27/2013   Procedure: LEFT HEART CATHETERIZATION WITH CORONARY ANGIOGRAM;  Surgeon: [name], MD;  Location: [facility];  Service: Cardiovascular;  Laterality: N/A;  . neck epidural    . POSTERIOR LUMBAR FUSION 4 LEVEL N/A 03/28/2018   Procedure: Thoracic eight -Lumbar two- FIXATION WITH SCREW PLACEMENT, DECOMPRESSION Thoracic ten-Thoracic eleven  FOR OSTEOMYELITIS;  Surgeon: Kristeen Miss, MD;  Location: [facility];  Service: Neurosurgery;  Laterality: N/A;  . SHOULDER SURGERY Bilateral   . TEE WITHOUT CARDIOVERSION N/A 03/21/2018   Procedure: TRANSESOPHAGEAL ECHOCARDIOGRAM (TEE) WITH PROPOFOL;  Surgeon: [name], MD;  [facility];  Service: Cardiovascular;  Laterality: N/A;    There were no vitals filed for this visit.  Subjective Assessment - 10/25/18 1258    Subjective  Patient reports he is doing well today and denies pain. He reports he is still trying to do more standing, and is trying to don/doff pants in standing. He makes sure his chair is behind him the bed is next to him and he has grab bars on the other side.     Pertinent History  T10-11 Epidural Abscess, back surgery on 03/28/18, right knee partial medial menisectomy and chondroplasty    Limitations  Standing;Walking;House hold activities;Lifting    Diagnostic tests  CT Lumbar Spine 05/12/18: "Spinal stimulator device, along with multilevel multisegment fusion hardware appear appropriately placed without adverse features."    Patient Stated Goals  To walk       [facility] Adult PT Treatment/Exercise - 10/25/18 0001      Ambulation/Gait   Ambulation/Gait  Yes    Ambulation/Gait Assistance  4: Min assist;3: Mod assist   Mod when Le buckle to prevent LOB   Ambulation/Gait Assistance Details  W/c follow for safety    Ambulation Distance (Feet)  180 Feet   4 bouts (40', 45', 60', 35')   Assistive device  Rolling  walker    Gait Pattern  Decreased dorsiflexion - left;Decreased dorsiflexion - right;Trunk flexed;Decreased step length - right;Decreased step length - left;Step-through pattern    Gait Comments  Patient demonstrated improved upright posutre and knee extension throughout, he did experience buckling of bil knees during gait but was able to self grade distance walk and fatigue level to request a seated rest break when needed      Knee/Hip Exercises: Stretches   Passive Hamstring Stretch  3 reps;30 seconds;Both    Passive Hamstring Stretch Limitations  seated with 12" box      Knee/Hip Exercises: Standing   Forward Step Up  Both;2 sets;5 reps;Hand Hold: 2;Step Height: 4"    Forward Step Up Limitations  min-mod assist for safety in // bars; Pt with scisorring step back with Lt LE requiring to reposition prior to stepping up again to improve BOS.    Other Standing Knee Exercises  standing balance: WBOS, no UE support, 2x 15", 1x 25"  PT Education - 10/25/18 1258    Education Details  Educated on exercises throughout and on posture to look up wiht step up training. Educated on impoprtance of hamstring stretch and hamstring length for gait and mobility with patients who have experienced a SCI.    Person(s) Educated  Patient    Methods  Explanation    Comprehension  Verbalized understanding       PT Short Term Goals - 10/18/18 1043      PT SHORT TERM GOAL #1   Title  Patient will report understanding and regular compliance with HEP to improve strength and overall functional mobility.     Baseline  09/13/18: Patient stated he has not been doing his exercises at home as often as he should.     Time  4    Period  Weeks    Status  Partially Met      PT SHORT TERM GOAL #2   Title  Patient will demonstrate improvement of 1/2 MMT strength grade in all muscle groups tested to assist patient with safe transfers, ambulation, and standing.     Baseline  09/13/18: Patient demonstrated  improvement in some, but not all muscle groups tested    Time  4    Period  Weeks    Status  Partially Met      PT SHORT TERM GOAL #3   Title  Patient will report or demonstrate ability to perform ambulation for 2 minutes with LRAD with no more than supervision assistance without rest break and reported no more than minimal difficulty.     Baseline  09/13/18: Patient is able to ambulate for about 1 minute before requiring a rest break with minimal assistance and RW.      Time  4    Period  Weeks    Status  On-going      PT SHORT TERM GOAL #4   Title  Patient will demonstrate ability to ambulate 60 feet without rest break using LRAD with no more than minimal assistance.    Time  2    Period  Weeks    Status  Achieved        PT Long Term Goals - 10/18/18 1043      PT LONG TERM GOAL #1   Title  Patient will report or demonstrate ability to perform ambulation for 5 minutes with LRAD with no more than supervision assistance without rest break and reported no more than minimal difficulty.     Baseline  09/13/18: Patient is able to ambulate for about 1 minute before requiring a rest break with minimal assistance and RW.      Time  8    Period  Weeks    Status  On-going      PT LONG TERM GOAL #2   Title  Patient will report ability to stand at a support surface for 10 minutes without a rest break in order to perform household chores with increased ease.     Baseline  10/25: 3-78mns    Time  8    Period  Weeks    Status  On-going      PT LONG TERM GOAL #3   Title  Patient will demonstrate ability to ambulate 120 feet without rest break using LRAD with no more than minimal assistance.    Baseline  Old goal: Patient will demonstrate an improvement of 10% on FOTO indicating improvement in percieved functional mobility. 09/13/18: Patient's FOTO score was found to be  46%.     Time  4    Period  Weeks    Status  On-going         Plan - 10/25/18 1258    Clinical Impression Statement   Patient continues to progress with gait training, requiring min assist for cues with RW safety and to maintain posture. He demonstrates overall improved upright and midline posture during ambulation and is able to self-grade his fatigue level to prevent LE buckling. He initiated step up training in // bars and upon standing was able to release bars and maintain upright balance without UE support. Hamstring stretch on step was reviewed for HEP and patient educated on importance of hamstring flexibility to improve gait mechanics. He will continue to benefit from skilled PT interventions to address current impairments in aquatic and land-based setting to improve QOL and independence at home.    PT Frequency  3x / week    PT Duration  6 weeks    PT Treatment/Interventions  ADLs/Self Care Home Management;Aquatic Therapy;DME Instruction;Gait training;Functional mobility training;Stair training;Therapeutic activities;Therapeutic exercise;Balance training;Neuromuscular re-education;Patient/family education;Orthotic Fit/Training;Wheelchair mobility training;Manual techniques;Passive range of motion;Energy conservation;Dry needling;Splinting;Taping    PT Next Visit Plan  Continue with gait training with RW and wc follow. Continue hurdle step over in // bars and step ups. Add gastroc stretch and continue with hamstring stretch. Perform standing w/o UE support in // bars for endurance training and add cone taps and coordination drills/exercises    PT Home Exercise Plan  08/16/18:  seated DF/PF, LAQ, marching, abd with theraband and bridge    Consulted and Agree with Plan of Care  Patient       Patient will benefit from skilled therapeutic intervention in order to improve the following deficits and impairments:  Abnormal gait, Decreased balance, Decreased endurance, Decreased mobility, Difficulty walking, Hypomobility, Decreased range of motion, Decreased activity tolerance, Decreased strength, Impaired  flexibility  Visit Diagnosis: Other abnormalities of gait and mobility  Other symptoms and signs involving the musculoskeletal system  Muscle weakness (generalized)     Problem List Patient Active Problem List   Diagnosis Date Noted  . Pseudogout of knee   . Pyogenic arthritis of right knee joint ([place])   . Falls frequently 05/08/2018  . Fall   . Sleep disturbance   . Post-op pain   . Hypokalemia   . Morbid obesity ([place])   . Postoperative pain   . DVT, lower extremity, distal, acute, bilateral ([place])   . Acute blood loss anemia   . Anemia of chronic disease   . Essential hypertension   . Bacteremia   . Myelopathy ([place]) 03/30/2018  . Paraplegia ([place])   . Postlaminectomy syndrome, lumbar region   . Epidural abscess   . Abdominal distension   . Encephalopathy   . Weakness of both lower extremities   . Discitis of thoracic region   . Spinal cord stimulator status   . Staphylococcus aureus sepsis ([place]) 03/20/2018  . Bacteremia due to methicillin susceptible Staphylococcus aureus (MSSA) 03/18/2018  . Obesity 03/18/2018  . Nephrolithiasis 03/16/2018  . AKI (acute kidney injury) ([place]) 03/16/2018  . Cognitive impairment 03/16/2018  . Chronic pain syndrome 03/16/2018  . B12 deficiency 01/28/2016  . Memory difficulty 07/23/2015  . History of cerebrovascular disease 07/23/2015  . FH: colon cancer 08/13/2014  . Hx of adenomatous colonic polyps 08/13/2014  . Chronic back pain 05/30/2012  . CAD, NATIVE VESSEL 07/17/2010  . HYPERCHOLESTEROLEMIA 10/31/2009  . SMOKELESS TOBACCO ABUSE 10/31/2009  . Obstructive  sleep apnea 10/31/2009  . Essential hypertension, benign 10/31/2009  . GERD 10/31/2009    Kipp Brood, PT, DPT Physical Therapist with [facility]  10/25/2018 3:11 PM    [place], [place], [address] Phone: [phone]   Fax:  [phone]  Name: Cody Velez MRN:  [id number] Date of Birth: 1946-07-30

## 2018-10-26 ENCOUNTER — Ambulatory Visit (HOSPITAL_COMMUNITY): Payer: Medicare Other | Admitting: Physical Therapy

## 2018-10-26 ENCOUNTER — Encounter (HOSPITAL_COMMUNITY): Payer: Self-pay | Admitting: Physical Therapy

## 2018-10-26 DIAGNOSIS — R2689 Other abnormalities of gait and mobility: Secondary | ICD-10-CM

## 2018-10-26 DIAGNOSIS — R29898 Other symptoms and signs involving the musculoskeletal system: Secondary | ICD-10-CM | POA: Diagnosis not present

## 2018-10-26 DIAGNOSIS — M6281 Muscle weakness (generalized): Secondary | ICD-10-CM | POA: Diagnosis not present

## 2018-10-26 NOTE — Therapy (Signed)
[place], [place], [address] Phone: [phone]   Fax:  [phone]  Physical Therapy Treatment  Patient Details  Name: Cody Velez MRN: [id number] Date of Birth: Jul 22, 1946 Referring Provider (PT): Asencion Noble, MD   Encounter Date: 10/26/2018  PT End of Session - 10/26/18 1446    Visit Number  27    Number of Visits  67    Date for PT Re-Evaluation  11/24/18   Minireassess 11/03/18   Authorization Type  Primary: Medicare; Secondary: BC[employer] (75 visits per calendar year with 14 used at evaluation)    Authorization Time Period  08/15/18 - 10/14/18; NEW: 10/13/18 to 11/24/18    Authorization - Visit Number  71   KX   Authorization - Number of Visits  20    PT Start Time  7001    PT Stop Time  1430    PT Time Calculation (min)  45 min    Equipment Utilized During Treatment  Gait belt    Activity Tolerance  Patient tolerated treatment well    Behavior During Therapy  WFL for tasks assessed/performed       Past Medical History:  Diagnosis Date  . Allergic rhinitis   . Arthritis   . B12 deficiency   . Cervicogenic headache 01/28/2016  . Childhood asthma   . Chronic back pain   . Coronary atherosclerosis of native coronary artery    Mild nonobstructive CAD at catheterization January 2015  . Depression   . Essential hypertension   . Falls   . GERD (gastroesophageal reflux disease)   . History of cerebrovascular disease 07/23/2015  . History of pneumonia 02/2011  . Hyperlipidemia   . Kidney stone   . Memory difficulty 07/23/2015  . OSA (obstructive sleep apnea)    CPAP - Dr. [name]  . Prostate cancer ([place])   . PTSD (post-traumatic stress disorder)    [place]  . Rectal bleeding     Past Surgical History:  Procedure Laterality Date  . APPLICATION OF ROBOTIC ASSISTANCE FOR SPINAL PROCEDURE N/A 03/28/2018   Procedure: APPLICATION OF ROBOTIC ASSISTANCE FOR SPINAL PROCEDURE;  Surgeon: Kristeen Miss, MD;  Location:  [facility];  Service: Neurosurgery;  Laterality: N/A;  . BACK SURGERY  02/14/12   lumbar OR #7; "today redid L1L2; replaced screws; added bone from hip"  . BILATERAL KNEE ARTHROSCOPY    . COLONOSCOPY  10/15/2008   Dr. [name]: tubular adenoma   . COLONOSCOPY  12/17/2003   VCB:SWHQPR rectal and colon  . COLONOSCOPY N/A 09/05/2014   Procedure: COLONOSCOPY;  Surgeon: Daneil Dolin, MD;  [facility];  Service: Endoscopy;  Laterality: N/A;  7:30-rescheduled 9/17 to [place] notified pt  . CYSTOSCOPY WITH STENT PLACEMENT Right 01/27/2016   Procedure: CYSTOSCOPY WITH STENT PLACEMENT;  Surgeon: [name], MD;  Location: [facility];  Service: Urology;  Laterality: Right;  . CYSTOSCOPY/RETROGRADE/URETEROSCOPY/STONE EXTRACTION WITH BASKET Right 01/27/2016   Procedure: CYSTOSCOPY, RIGHT RETROGRADE, RIGHT URETEROSCOPY, STONE EXTRACTION ;  Surgeon: [name], MD;  Location: [facility];  Service: Urology;  Laterality: Right;  . ESOPHAGOGASTRODUODENOSCOPY  10/15/2008     Dr Rourk:Schatzki's ring status post dilation and disruption via 69 F Maloney dilator/ otherwise unremarkable esophagus, small hiatal hernia, multiple fundal gland polyps not manipulated, gastritis, negative H.pylori  . ESOPHAGOGASTRODUODENOSCOPY  06/21/02   FFM:BWGYK sliding hiatal hernia with mild changes of reflux esophagitis limited to gastroesophageal junction.  Noncritical ring at distal esophagus, 3 cm  proximal to gastroesophageal junction/Antral gastritis  . [place]   "broke face playing softball"  . FRACTURE SURGERY     "left wrist; broke it; took spur off"  . HOLMIUM LASER APPLICATION Right 8/0/9983   Procedure: HOLMIUM LASER APPLICATION;  Surgeon: [name], MD;  Location: [facility];  Service: Urology;  Laterality: Right;  . KNEE ARTHROSCOPY Right 05/18/2018   Procedure: PARTIAL MEDIAL MENISECTOMY AND SURGICAL LAVAGE AND CHONDROPLASTY;  Surgeon: [name], MD;  Location: [facility];   Service: Orthopedics;  Laterality: Right;  . LEFT HEART CATHETERIZATION WITH CORONARY ANGIOGRAM N/A 12/27/2013   Procedure: LEFT HEART CATHETERIZATION WITH CORONARY ANGIOGRAM;  Surgeon: [name], MD;  Location: [facility] CATH [facility];  Service: Cardiovascular;  Laterality: N/A;  . neck epidural    . POSTERIOR LUMBAR FUSION 4 LEVEL N/A 03/28/2018   Procedure: Thoracic eight -Lumbar two- FIXATION WITH SCREW PLACEMENT, DECOMPRESSION Thoracic ten-Thoracic eleven  FOR OSTEOMYELITIS;  Surgeon: Kristeen Miss, MD;  Location: [facility];  Service: Neurosurgery;  Laterality: N/A;  . SHOULDER SURGERY Bilateral   . TEE WITHOUT CARDIOVERSION N/A 03/21/2018   Procedure: TRANSESOPHAGEAL ECHOCARDIOGRAM (TEE) WITH PROPOFOL;  Surgeon: [name], MD;  [facility];  Service: Cardiovascular;  Laterality: N/A;    There were no vitals filed for this visit.  Subjective Assessment - 10/26/18 1440    Subjective  Patient reported that he is tired today. Stated he is feeling tired today. He denied any pain.     Pertinent History  T10-11 Epidural Abscess, back surgery on 03/28/18, right knee partial medial menisectomy and chondroplasty    Limitations  Standing;Walking;House hold activities;Lifting    Diagnostic tests  CT Lumbar Spine 05/12/18: "Spinal stimulator device, along with multilevel multisegment fusion hardware appear appropriately placed without adverse features."    Patient Stated Goals  To walk    Currently in Pain?  No/denies                       [facility] Adult PT Treatment/Exercise - 10/26/18 0001      Ambulation/Gait   Ambulation/Gait  Yes    Ambulation/Gait Assistance  4: Min assist;3: Mod assist   Mod assist when patient's knee buckled   Ambulation/Gait Assistance Details  W/C follow for safety    Ambulation Distance (Feet)  242 Feet   4 bouts: 60', 52', 64', 45'   Assistive device  Rolling walker    Gait Pattern  Decreased dorsiflexion - left;Decreased dorsiflexion - right;Trunk  flexed;Decreased step length - right;Decreased step length - left;Step-through pattern    Gait Comments  On initial bout of ambulation patient demonstrated buckling of knees intermittently, however patient's knee buckling decreased on subsequent bouts       Knee/Hip Exercises: Stretches   Passive Hamstring Stretch  3 reps;30 seconds;Both    Passive Hamstring Stretch Limitations  seated with 6" box      Knee/Hip Exercises: Seated   Other Seated Knee/Hip Exercises  Seated marching over 6'' hurdle x 15 each LE, intermittent foot contact with hurdle    Abduction/Adduction   Strengthening;Both;1 set;15 reps    Abd/Adduction Limitations  Hip adduction with pink ball x 15 repetitions with 5'' holds             PT Education - 10/26/18 1443    Education Details  Discussed importance of monitoring blood pressure and signs of stroke.     Person(s) Educated  Patient    Methods  Explanation    Comprehension  Verbalized understanding       PT Short Term Goals - 10/18/18 1043      PT SHORT TERM GOAL #1   Title  Patient will report understanding and regular compliance with HEP to improve strength and overall functional mobility.     Baseline  09/13/18: Patient stated he has not been doing his exercises at home as often as he should.     Time  4    Period  Weeks    Status  Partially Met      PT SHORT TERM GOAL #2   Title  Patient will demonstrate improvement of 1/2 MMT strength grade in all muscle groups tested to assist patient with safe transfers, ambulation, and standing.     Baseline  09/13/18: Patient demonstrated improvement in some, but not all muscle groups tested    Time  4    Period  Weeks    Status  Partially Met      PT SHORT TERM GOAL #3   Title  Patient will report or demonstrate ability to perform ambulation for 2 minutes with LRAD with no more than supervision assistance without rest break and reported no more than minimal difficulty.     Baseline  09/13/18: Patient is able  to ambulate for about 1 minute before requiring a rest break with minimal assistance and RW.      Time  4    Period  Weeks    Status  On-going      PT SHORT TERM GOAL #4   Title  Patient will demonstrate ability to ambulate 60 feet without rest break using LRAD with no more than minimal assistance.    Time  2    Period  Weeks    Status  Achieved        PT Long Term Goals - 10/18/18 1043      PT LONG TERM GOAL #1   Title  Patient will report or demonstrate ability to perform ambulation for 5 minutes with LRAD with no more than supervision assistance without rest break and reported no more than minimal difficulty.     Baseline  09/13/18: Patient is able to ambulate for about 1 minute before requiring a rest break with minimal assistance and RW.      Time  8    Period  Weeks    Status  On-going      PT LONG TERM GOAL #2   Title  Patient will report ability to stand at a support surface for 10 minutes without a rest break in order to perform household chores with increased ease.     Baseline  10/25: 3-47mns    Time  8    Period  Weeks    Status  On-going      PT LONG TERM GOAL #3   Title  Patient will demonstrate ability to ambulate 120 feet without rest break using LRAD with no more than minimal assistance.    Baseline  Old goal: Patient will demonstrate an improvement of 10% on FOTO indicating improvement in percieved functional mobility. 09/13/18: Patient's FOTO score was found to be 46%.     Time  4    Period  Weeks    Status  On-going            Plan - 10/26/18 1502    Clinical Impression Statement  This session began with therapist taking patient's blood pressure on left arm found to be  114/70. Then progressed to patient ambulating. Patient ambulated 242 feet this session total during all 4 bouts. Patient demonstrated knee buckling initially, but this increased after first bout of ambulation. Ended session with seated therapeutic exercise to patient's tolerance. Educated  patient and patient's wife about monitoring patient's blood pressure and stroke warning signs as patient reported some transient arm weakness during session. Plan to continue with progression of ambulation, weightbearing activities, and therapeutic exercises.     PT Frequency  3x / week    PT Duration  6 weeks    PT Treatment/Interventions  ADLs/Self Care Home Management;Aquatic Therapy;DME Instruction;Gait training;Functional mobility training;Stair training;Therapeutic activities;Therapeutic exercise;Balance training;Neuromuscular re-education;Patient/family education;Orthotic Fit/Training;Wheelchair mobility training;Manual techniques;Passive range of motion;Energy conservation;Dry needling;Splinting;Taping    PT Next Visit Plan  Trial gastroc stretch and add to HEP as well as Hamstring stretch. Continue with gait training with RW and wc follow. Continue hurdle step over in // bars and step ups. Add gastroc stretch and continue with hamstring stretch. Perform standing w/o UE support in // bars for endurance training and add cone taps and coordination drills/exercises    PT Home Exercise Plan  08/16/18:  seated DF/PF, LAQ, marching, abd with theraband and bridge    Consulted and Agree with Plan of Care  Patient       Patient will benefit from skilled therapeutic intervention in order to improve the following deficits and impairments:  Abnormal gait, Decreased balance, Decreased endurance, Decreased mobility, Difficulty walking, Hypomobility, Decreased range of motion, Decreased activity tolerance, Decreased strength, Impaired flexibility  Visit Diagnosis: Other abnormalities of gait and mobility  Other symptoms and signs involving the musculoskeletal system  Muscle weakness (generalized)     Problem List Patient Active Problem List   Diagnosis Date Noted  . Pseudogout of knee   . Pyogenic arthritis of right knee joint ([place])   . Falls frequently 05/08/2018  . Fall   . Sleep disturbance    . Post-op pain   . Hypokalemia   . Morbid obesity ([place])   . Postoperative pain   . DVT, lower extremity, distal, acute, bilateral ([place])   . Acute blood loss anemia   . Anemia of chronic disease   . Essential hypertension   . Bacteremia   . Myelopathy ([place]) 03/30/2018  . Paraplegia ([place])   . Postlaminectomy syndrome, lumbar region   . Epidural abscess   . Abdominal distension   . Encephalopathy   . Weakness of both lower extremities   . Discitis of thoracic region   . Spinal cord stimulator status   . Staphylococcus aureus sepsis ([place]) 03/20/2018  . Bacteremia due to methicillin susceptible Staphylococcus aureus (MSSA) 03/18/2018  . Obesity 03/18/2018  . Nephrolithiasis 03/16/2018  . AKI (acute kidney injury) ([place]) 03/16/2018  . Cognitive impairment 03/16/2018  . Chronic pain syndrome 03/16/2018  . B12 deficiency 01/28/2016  . Memory difficulty 07/23/2015  . History of cerebrovascular disease 07/23/2015  . FH: colon cancer 08/13/2014  . Hx of adenomatous colonic polyps 08/13/2014  . Chronic back pain 05/30/2012  . CAD, NATIVE VESSEL 07/17/2010  . HYPERCHOLESTEROLEMIA 10/31/2009  . SMOKELESS TOBACCO ABUSE 10/31/2009  . Obstructive sleep apnea 10/31/2009  . Essential hypertension, benign 10/31/2009  . GERD 10/31/2009   Clarene Critchley PT, DPT 3:06 PM, 10/26/18 [place] [address] [place], [place], [address] Phone: [phone]   Fax:  [phone]  Name: MALAKI KOURY MRN: [id number] Date of Birth: March 13, 1946

## 2018-10-27 ENCOUNTER — Encounter (HOSPITAL_COMMUNITY): Payer: Medicare Other

## 2018-10-30 ENCOUNTER — Other Ambulatory Visit: Payer: Self-pay

## 2018-10-30 ENCOUNTER — Ambulatory Visit (HOSPITAL_COMMUNITY): Payer: Medicare Other

## 2018-10-30 ENCOUNTER — Encounter (HOSPITAL_COMMUNITY): Payer: Self-pay

## 2018-10-30 DIAGNOSIS — R29898 Other symptoms and signs involving the musculoskeletal system: Secondary | ICD-10-CM

## 2018-10-30 DIAGNOSIS — R2689 Other abnormalities of gait and mobility: Secondary | ICD-10-CM

## 2018-10-30 DIAGNOSIS — M6281 Muscle weakness (generalized): Secondary | ICD-10-CM | POA: Diagnosis not present

## 2018-10-30 NOTE — Therapy (Signed)
[facility] [address] [place], [place], [address] Phone: [phone]   Fax:  [phone]  Physical Therapy Aquatic Treatment  Patient Details  Name: Cody Velez MRN: [id number] Date of Birth: 10/26/1946 Referring Provider (PT): Roy Fagan, MD   Encounter Date: 10/30/2018  PT End of Session - 10/30/18 1637    Visit Number  28    Number of Visits  43    Date for PT Re-Evaluation  11/24/18   Minireassess 11/03/18   Authorization Type  Primary: Medicare; Secondary: [employer] (75 visits per calendar year with 14 used at evaluation)    Authorization Time Period  08/15/18 - 10/14/18; NEW: 10/13/18 to 11/24/18    Authorization - Visit Number  43   KX   Authorization - Number of Visits  75    PT Start Time  1350    PT Stop Time  1433    PT Time Calculation (min)  43 min    Equipment Utilized During Treatment  Gait belt    Activity Tolerance  Patient tolerated treatment well    Behavior During Therapy  WFL for tasks assessed/performed       Past Medical History:  Diagnosis Date  . Allergic rhinitis   . Arthritis   . B12 deficiency   . Cervicogenic headache 01/28/2016  . Childhood asthma   . Chronic back pain   . Coronary atherosclerosis of native coronary artery    Mild nonobstructive CAD at catheterization January 2015  . Depression   . Essential hypertension   . Falls   . GERD (gastroesophageal reflux disease)   . History of cerebrovascular disease 07/23/2015  . History of pneumonia 02/2011  . Hyperlipidemia   . Kidney stone   . Memory difficulty 07/23/2015  . OSA (obstructive sleep apnea)    CPAP - Dr. Clance  . Prostate cancer (HCC)   . PTSD (post-traumatic stress disorder)    [place]  . Rectal bleeding     Past Surgical History:  Procedure Laterality Date  . APPLICATION OF ROBOTIC ASSISTANCE FOR SPINAL PROCEDURE N/A 03/28/2018   Procedure: APPLICATION OF ROBOTIC ASSISTANCE FOR SPINAL PROCEDURE;  Surgeon: Elsner, Henry, MD;   Location: [facility] OR;  Service: Neurosurgery;  Laterality: N/A;  . BACK SURGERY  02/14/12   lumbar OR #7; "today redid L1L2; replaced screws; added bone from hip"  . BILATERAL KNEE ARTHROSCOPY    . COLONOSCOPY  10/15/2008   Dr. Rourk: tubular adenoma   . COLONOSCOPY  12/17/2003   RMR:Normal rectal and colon  . COLONOSCOPY N/A 09/05/2014   Procedure: COLONOSCOPY;  Surgeon: Robert M Rourk, MD;  Location: [facility];  Service: Endoscopy;  Laterality: N/A;  7:30-rescheduled 9/17 to 1030 Leigh Ann notified pt  . CYSTOSCOPY WITH STENT PLACEMENT Right 01/27/2016   Procedure: CYSTOSCOPY WITH STENT PLACEMENT;  Surgeon: Stephen Dahlstedt, MD;  Location: [facility] ORS;  Service: Urology;  Laterality: Right;  . CYSTOSCOPY/RETROGRADE/URETEROSCOPY/STONE EXTRACTION WITH BASKET Right 01/27/2016   Procedure: CYSTOSCOPY, RIGHT RETROGRADE, RIGHT URETEROSCOPY, STONE EXTRACTION ;  Surgeon: Stephen Dahlstedt, MD;  Location: [facility] ORS;  Service: Urology;  Laterality: Right;  . ESOPHAGOGASTRODUODENOSCOPY  10/15/2008     Dr Rourk:Schatzki's ring status post dilation and disruption via 58 F Maloney dilator/ otherwise unremarkable esophagus, small hiatal hernia, multiple fundal gland polyps not manipulated, gastritis, negative H.pylori  . ESOPHAGOGASTRODUODENOSCOPY  06/21/02   NUR:Small sliding hiatal hernia with mild changes of reflux esophagitis limited to gastroesophageal junction.  Noncritical ring at distal esophagus, 3   cm proximal to gastroesophageal junction/Antral gastritis  . FACIAL COSMETIC SURGERY  ` 1985   "broke face playing softball"  . FRACTURE SURGERY     "left wrist; broke it; took spur off"  . HOLMIUM LASER APPLICATION Right 01/27/2016   Procedure: HOLMIUM LASER APPLICATION;  Surgeon: Stephen Dahlstedt, MD;  Location: [facility] ORS;  Service: Urology;  Laterality: Right;  . KNEE ARTHROSCOPY Right 05/18/2018   Procedure: PARTIAL MEDIAL MENISECTOMY AND SURGICAL LAVAGE AND CHONDROPLASTY;  Surgeon: Landau, Joshua, MD;  Location: [facility]  OR;  Service: Orthopedics;  Laterality: Right;  . LEFT HEART CATHETERIZATION WITH CORONARY ANGIOGRAM N/A 12/27/2013   Procedure: LEFT HEART CATHETERIZATION WITH CORONARY ANGIOGRAM;  Surgeon: Peter M Jordan, MD;  Location: [facility] CATH LAB;  Service: Cardiovascular;  Laterality: N/A;  . neck epidural    . POSTERIOR LUMBAR FUSION 4 LEVEL N/A 03/28/2018   Procedure: Thoracic eight -Lumbar two- FIXATION WITH SCREW PLACEMENT, DECOMPRESSION Thoracic ten-Thoracic eleven  FOR OSTEOMYELITIS;  Surgeon: Elsner, Henry, MD;  Location: [facility] OR;  Service: Neurosurgery;  Laterality: N/A;  . SHOULDER SURGERY Bilateral   . TEE WITHOUT CARDIOVERSION N/A 03/21/2018   Procedure: TRANSESOPHAGEAL ECHOCARDIOGRAM (TEE) WITH PROPOFOL;  Surgeon: McDowell, Samuel G, MD;  Location: [facility];  Service: Cardiovascular;  Laterality: N/A;    There were no vitals filed for this visit.  Subjective Assessment - 10/30/18 1628    Subjective  Patient reports he had a good weekend today and went to the [employer] birthday party. He states Tony was given the "Woman of the Year" award for their chapter and he is planning ot nominate her for the region's award.     Pertinent History  T10-11 Epidural Abscess, back surgery on 03/28/18, right knee partial medial menisectomy and chondroplasty    Limitations  Standing;Walking;House hold activities;Lifting    Diagnostic tests  CT Lumbar Spine 05/12/18: "Spinal stimulator device, along with multilevel multisegment fusion hardware appear appropriately placed without adverse features."    Patient Stated Goals  To walk    Currently in Pain?  No/denies        Adult Aquatic Therapy - 10/30/18 1630      Aquatic Therapy Subjective   Subjective  Patient reports he had a good weekend today and went to the [employer] birthday party. He states Tony was given the "Woman of the Year" award for their chapter and he is planning ot nominate her for the region's award.       Treatment   Gait  4x 20' forward  resisted gait (junior kickboard in water) and 4x 20' retro gait with resistance (junior kickboard in water). Patient ambulated throughout session without UE assistance.    Exercises  Squat 1x 20 reps. Heel raises with 3 sec holds, 1x20 reps.     Specific Exercises  Hip/Low Back    Hip/Low Back  1x 6 reps bil LE: platform step up with UE support, step up to stand (16-18" platform height)    Balance  Ai Chi: 10 reps floating with 1 yellow pool weight, 10 reps uplifting with yellow pool weights, 10 reps gathering with yellow pool weights, 10 reps freeing with yellow pool weights. Pt maintained squat and was able to regain balance and shift weight forward after LOB posteriorly to continue with exercises. He required cues to weight shift anteriorly.       PT Education - 10/30/18 1636    Education Details  Educated on new Ai Chi exercises throughout session. Educated on importance of patient   initiated postural reactions rather than lettign himself fall back in pool. Discussed that this is important to improve core strength.    Person(s) Educated  Patient    Methods  Explanation    Comprehension  Verbalized understanding       PT Short Term Goals - 10/18/18 1043      PT SHORT TERM GOAL #1   Title  Patient will report understanding and regular compliance with HEP to improve strength and overall functional mobility.     Baseline  09/13/18: Patient stated he has not been doing his exercises at home as often as he should.     Time  4    Period  Weeks    Status  Partially Met      PT SHORT TERM GOAL #2   Title  Patient will demonstrate improvement of 1/2 MMT strength grade in all muscle groups tested to assist patient with safe transfers, ambulation, and standing.     Baseline  09/13/18: Patient demonstrated improvement in some, but not all muscle groups tested    Time  4    Period  Weeks    Status  Partially Met      PT SHORT TERM GOAL #3   Title  Patient will report or demonstrate ability to  perform ambulation for 2 minutes with LRAD with no more than supervision assistance without rest break and reported no more than minimal difficulty.     Baseline  09/13/18: Patient is able to ambulate for about 1 minute before requiring a rest break with minimal assistance and RW.      Time  4    Period  Weeks    Status  On-going      PT SHORT TERM GOAL #4   Title  Patient will demonstrate ability to ambulate 60 feet without rest break using LRAD with no more than minimal assistance.    Time  2    Period  Weeks    Status  Achieved        PT Long Term Goals - 10/18/18 1043      PT LONG TERM GOAL #1   Title  Patient will report or demonstrate ability to perform ambulation for 5 minutes with LRAD with no more than supervision assistance without rest break and reported no more than minimal difficulty.     Baseline  09/13/18: Patient is able to ambulate for about 1 minute before requiring a rest break with minimal assistance and RW.      Time  8    Period  Weeks    Status  On-going      PT LONG TERM GOAL #2   Title  Patient will report ability to stand at a support surface for 10 minutes without a rest break in order to perform household chores with increased ease.     Baseline  10/25: 3-4mins    Time  8    Period  Weeks    Status  On-going      PT LONG TERM GOAL #3   Title  Patient will demonstrate ability to ambulate 120 feet without rest break using LRAD with no more than minimal assistance.    Baseline  Old goal: Patient will demonstrate an improvement of 10% on FOTO indicating improvement in percieved functional mobility. 09/13/18: Patient's FOTO score was found to be 46%.     Time  4    Period  Weeks    Status  On-going         Plan - 10/30/18 1638    Clinical Impression Statement  Patient participated in aquatic therapy session and ambulated in pool without UE assistance for 90% of the session. He also initiated resisted walking for forward/retro gait. He required some  assistance to prevent LE scissoring with retro gait. Ai Chi exercises were continued today and patient performed these with yellow resistance bars for UE's. He required greater cuing this session to deter posterior LOB and assistance to facilitate anterior weight shift. Step ups were continued as well and patient had greater fatigue this date and was unable to hold upright standing posture for more than 2 seconds. He will continue to benefit from skilled PT interventions to address impairments in aquatic and land-based setting to improve QOL and independence at home.    PT Frequency  3x / week    PT Duration  6 weeks    PT Treatment/Interventions  ADLs/Self Care Home Management;Aquatic Therapy;DME Instruction;Gait training;Functional mobility training;Stair training;Therapeutic activities;Therapeutic exercise;Balance training;Neuromuscular re-education;Patient/family education;Orthotic Fit/Training;Wheelchair mobility training;Manual techniques;Passive range of motion;Energy conservation;Dry needling;Splinting;Taping    PT Next Visit Plan  Continue with gait training with RW and wc follow. Continue hurdle step over in // bars and step ups. Add gastroc stretch and continue with hamstring stretch. Perform standing w/o UE support in // bars for endurance training and add cone taps and coordination drills/exercises (ladder). Progress Ai Chi and yoga poses in pool for flexibility and endurance training.    PT Home Exercise Plan  08/16/18:  seated DF/PF, LAQ, marching, abd with theraband and bridge    Consulted and Agree with Plan of Care  Patient       Patient will benefit from skilled therapeutic intervention in order to improve the following deficits and impairments:  Abnormal gait, Decreased balance, Decreased endurance, Decreased mobility, Difficulty walking, Hypomobility, Decreased range of motion, Decreased activity tolerance, Decreased strength, Impaired flexibility  Visit Diagnosis: Other  abnormalities of gait and mobility  Other symptoms and signs involving the musculoskeletal system  Muscle weakness (generalized)     Problem List Patient Active Problem List   Diagnosis Date Noted  . Pseudogout of knee   . Pyogenic arthritis of right knee joint ([place])   . Falls frequently 05/08/2018  . Fall   . Sleep disturbance   . Post-op pain   . Hypokalemia   . Morbid obesity ([place])   . Postoperative pain   . DVT, lower extremity, distal, acute, bilateral ([place])   . Acute blood loss anemia   . Anemia of chronic disease   . Essential hypertension   . Bacteremia   . Myelopathy ([place]) 03/30/2018  . Paraplegia ([place])   . Postlaminectomy syndrome, lumbar region   . Epidural abscess   . Abdominal distension   . Encephalopathy   . Weakness of both lower extremities   . Discitis of thoracic region   . Spinal cord stimulator status   . Staphylococcus aureus sepsis ([place]) 03/20/2018  . Bacteremia due to methicillin susceptible Staphylococcus aureus (MSSA) 03/18/2018  . Obesity 03/18/2018  . Nephrolithiasis 03/16/2018  . AKI (acute kidney injury) ([place]) 03/16/2018  . Cognitive impairment 03/16/2018  . Chronic pain syndrome 03/16/2018  . B12 deficiency 01/28/2016  . Memory difficulty 07/23/2015  . History of cerebrovascular disease 07/23/2015  . FH: colon cancer 08/13/2014  . Hx of adenomatous colonic polyps 08/13/2014  . Chronic back pain 05/30/2012  . CAD, NATIVE VESSEL 07/17/2010  . HYPERCHOLESTEROLEMIA 10/31/2009  . SMOKELESS TOBACCO ABUSE 10/31/2009  . Obstructive sleep apnea 10/31/2009  .  Essential hypertension, benign 10/31/2009  . GERD 10/31/2009    Rachel Quinn-Brown, PT, DPT Physical Therapist with [facility]  10/30/2018 4:44 PM    [facility] [address] [place], [place], [address] Phone: [phone]   Fax:  [phone]  Name: Cody Velez MRN: [id number] Date of Birth:  10/30/1946   

## 2018-11-01 ENCOUNTER — Encounter (HOSPITAL_COMMUNITY): Payer: Self-pay

## 2018-11-01 ENCOUNTER — Ambulatory Visit (HOSPITAL_COMMUNITY): Payer: Medicare Other

## 2018-11-01 DIAGNOSIS — R2689 Other abnormalities of gait and mobility: Secondary | ICD-10-CM

## 2018-11-01 DIAGNOSIS — R29898 Other symptoms and signs involving the musculoskeletal system: Secondary | ICD-10-CM

## 2018-11-01 DIAGNOSIS — M6281 Muscle weakness (generalized): Secondary | ICD-10-CM | POA: Diagnosis not present

## 2018-11-01 NOTE — Therapy (Signed)
[place], [place], [address] Phone: [phone]   Fax:  [phone]  Physical Therapy Treatment  Patient Details  Name: Cody Velez MRN: [id number] Date of Birth: [birth year] Referring Provider (PT): Asencion Noble, MD   Encounter Date: 11/01/2018  PT End of Session - 11/01/18 1128    Visit Number  29    Number of Visits  81    Date for PT Re-Evaluation  11/24/18   Minireassess 11/03/18   Authorization Type  Primary: Medicare; Secondary: BC[employer] (75 visits per calendar year with 14 used at evaluation)    Authorization Time Period  08/15/18 - 10/14/18; NEW: 10/13/18 to 11/24/18    Authorization - Visit Number  63   KX   Authorization - Number of Visits  75    PT Start Time  1120    PT Stop Time  1202    PT Time Calculation (min)  42 min    Equipment Utilized During Treatment  Gait belt    Activity Tolerance  Patient tolerated treatment well    Behavior During Therapy  WFL for tasks assessed/performed       Past Medical History:  Diagnosis Date  . Allergic rhinitis   . Arthritis   . B12 deficiency   . Cervicogenic headache 01/28/2016  . Childhood asthma   . Chronic back pain   . Coronary atherosclerosis of native coronary artery    Mild nonobstructive CAD at catheterization January 2015  . Depression   . Essential hypertension   . Falls   . GERD (gastroesophageal reflux disease)   . History of cerebrovascular disease 07/23/2015  . History of pneumonia 02/2011  . Hyperlipidemia   . Kidney stone   . Memory difficulty 07/23/2015  . OSA (obstructive sleep apnea)    CPAP - Dr. [name]  . Prostate cancer ([place])   . PTSD (post-traumatic stress disorder)    [place]  . Rectal bleeding     Past Surgical History:  Procedure Laterality Date  . APPLICATION OF ROBOTIC ASSISTANCE FOR SPINAL PROCEDURE N/A 03/28/2018   Procedure: APPLICATION OF ROBOTIC ASSISTANCE FOR SPINAL PROCEDURE;  Surgeon: Kristeen Miss, MD;   Location: [facility];  Service: Neurosurgery;  Laterality: N/A;  . BACK SURGERY  02/14/12   lumbar OR #7; "today redid L1L2; replaced screws; added bone from hip"  . BILATERAL KNEE ARTHROSCOPY    . COLONOSCOPY  10/15/2008   Dr. [name]: tubular adenoma   . COLONOSCOPY  12/17/2003   PXT:GGYIRS rectal and colon  . COLONOSCOPY N/A 09/05/2014   Procedure: COLONOSCOPY;  Surgeon: Daneil Dolin, MD;  [facility];  Service: Endoscopy;  Laterality: N/A;  7:30-rescheduled 9/17 to [place] notified pt  . CYSTOSCOPY WITH STENT PLACEMENT Right 01/27/2016   Procedure: CYSTOSCOPY WITH STENT PLACEMENT;  Surgeon: [name], MD;  Location: [facility];  Service: Urology;  Laterality: Right;  . CYSTOSCOPY/RETROGRADE/URETEROSCOPY/STONE EXTRACTION WITH BASKET Right 01/27/2016   Procedure: CYSTOSCOPY, RIGHT RETROGRADE, RIGHT URETEROSCOPY, STONE EXTRACTION ;  Surgeon: [name], MD;  Location: [facility];  Service: Urology;  Laterality: Right;  . ESOPHAGOGASTRODUODENOSCOPY  10/15/2008     Dr Rourk:Schatzki's ring status post dilation and disruption via 33 F Maloney dilator/ otherwise unremarkable esophagus, small hiatal hernia, multiple fundal gland polyps not manipulated, gastritis, negative H.pylori  . ESOPHAGOGASTRODUODENOSCOPY  06/21/02   WNI:OEVOJ sliding hiatal hernia with mild changes of reflux esophagitis limited to gastroesophageal junction.  Noncritical ring at distal esophagus, 3 cm  proximal to gastroesophageal junction/Antral gastritis  . [place]   "broke face playing softball"  . FRACTURE SURGERY     "left wrist; broke it; took spur off"  . HOLMIUM LASER APPLICATION Right 05/27/320   Procedure: HOLMIUM LASER APPLICATION;  Surgeon: [name], MD;  Location: [facility];  Service: Urology;  Laterality: Right;  . KNEE ARTHROSCOPY Right 05/18/2018   Procedure: PARTIAL MEDIAL MENISECTOMY AND SURGICAL LAVAGE AND CHONDROPLASTY;  Surgeon: [name], MD;  Location: [facility];  Service: Orthopedics;  Laterality: Right;  . LEFT HEART CATHETERIZATION WITH CORONARY ANGIOGRAM N/A 12/27/2013   Procedure: LEFT HEART CATHETERIZATION WITH CORONARY ANGIOGRAM;  Surgeon: [name], MD;  Location: [facility];  Service: Cardiovascular;  Laterality: N/A;  . neck epidural    . POSTERIOR LUMBAR FUSION 4 LEVEL N/A 03/28/2018   Procedure: Thoracic eight -Lumbar two- FIXATION WITH SCREW PLACEMENT, DECOMPRESSION Thoracic ten-Thoracic eleven  FOR OSTEOMYELITIS;  Surgeon: Kristeen Miss, MD;  Location: [facility];  Service: Neurosurgery;  Laterality: N/A;  . SHOULDER SURGERY Bilateral   . TEE WITHOUT CARDIOVERSION N/A 03/21/2018   Procedure: TRANSESOPHAGEAL ECHOCARDIOGRAM (TEE) WITH PROPOFOL;  Surgeon: [name], MD;  [facility];  Service: Cardiovascular;  Laterality: N/A;    There were no vitals filed for this visit.  Subjective Assessment - 11/01/18 1128    Subjective  Pt states that he is doing well. He is stretching at home.     Pertinent History  T10-11 Epidural Abscess, back surgery on 03/28/18, right knee partial medial menisectomy and chondroplasty    Limitations  Standing;Walking;House hold activities;Lifting    Diagnostic tests  CT Lumbar Spine 05/12/18: "Spinal stimulator device, along with multilevel multisegment fusion hardware appear appropriately placed without adverse features."    Patient Stated Goals  To walk    Currently in Pain?  No/denies            [facility] Adult PT Treatment/Exercise - 11/01/18 0001      Ambulation/Gait   Ambulation/Gait  Yes    Ambulation/Gait Assistance  4: Min assist    Ambulation/Gait Assistance Details  w/c follow for safety    Ambulation Distance (Feet)  152 Feet   total; 41f x2    Assistive device  Rolling walker    Gait Pattern  Decreased dorsiflexion - left;Decreased dorsiflexion - right;Trunk flexed;Decreased step length - right;Decreased step length - left;Step-through pattern    Gait Comments  min to no bouts  of knee buckling except very minor ones when he was fatigued and ready to sit down      Exercises   Exercises  Knee/Hip      Knee/Hip Exercises: Aerobic   Nustep  x5 mins, seat 13, L3, for reciprocal gait training       Balance Exercises - 11/01/18 1209      Balance Exercises: Standing   Standing Eyes Opened  Narrow base of support (BOS);Head turns   x10 reps   Partial Tandem Stance  Eyes open;Foam/compliant surface;Intermittent upper extremity support;5 reps   firm +UE flexion x10 reps each; x5 reps foam ~6sec each           PT Education - 11/01/18 1128    Education Details  exercise technique, continue HEP, continue stretching HS    Person(s) Educated  Patient    Methods  Explanation;Demonstration    Comprehension  Verbalized understanding;Returned demonstration       PT Short Term Goals - 10/18/18 1043  PT SHORT TERM GOAL #1   Title  Patient will report understanding and regular compliance with HEP to improve strength and overall functional mobility.     Baseline  09/13/18: Patient stated he has not been doing his exercises at home as often as he should.     Time  4    Period  Weeks    Status  Partially Met      PT SHORT TERM GOAL #2   Title  Patient will demonstrate improvement of 1/2 MMT strength grade in all muscle groups tested to assist patient with safe transfers, ambulation, and standing.     Baseline  09/13/18: Patient demonstrated improvement in some, but not all muscle groups tested    Time  4    Period  Weeks    Status  Partially Met      PT SHORT TERM GOAL #3   Title  Patient will report or demonstrate ability to perform ambulation for 2 minutes with LRAD with no more than supervision assistance without rest break and reported no more than minimal difficulty.     Baseline  09/13/18: Patient is able to ambulate for about 1 minute before requiring a rest break with minimal assistance and RW.      Time  4    Period  Weeks    Status  On-going      PT  SHORT TERM GOAL #4   Title  Patient will demonstrate ability to ambulate 60 feet without rest break using LRAD with no more than minimal assistance.    Time  2    Period  Weeks    Status  Achieved        PT Long Term Goals - 10/18/18 1043      PT LONG TERM GOAL #1   Title  Patient will report or demonstrate ability to perform ambulation for 5 minutes with LRAD with no more than supervision assistance without rest break and reported no more than minimal difficulty.     Baseline  09/13/18: Patient is able to ambulate for about 1 minute before requiring a rest break with minimal assistance and RW.      Time  8    Period  Weeks    Status  On-going      PT LONG TERM GOAL #2   Title  Patient will report ability to stand at a support surface for 10 minutes without a rest break in order to perform household chores with increased ease.     Baseline  10/25: 3-63mns    Time  8    Period  Weeks    Status  On-going      PT LONG TERM GOAL #3   Title  Patient will demonstrate ability to ambulate 120 feet without rest break using LRAD with no more than minimal assistance.    Baseline  Old goal: Patient will demonstrate an improvement of 10% on FOTO indicating improvement in percieved functional mobility. 09/13/18: Patient's FOTO score was found to be 46%.     Time  4    Period  Weeks    Status  On-going            Plan - 11/01/18 1213    Clinical Impression Statement  Introduced pt to Nustep this date to facilitate improved gait; pt tolerated it well. Continued with gait training and pt with less knee buckling this date as he only had 2 inor bouts of it at the end of each round  of walking due to fatigue. Performed standing balance this date in NBOS with head turns and partial tandem stance with and without UE flexion. Pt's R knee was weaker today and trying to buckle on him during and he required multiple uses of BUE to prevent buckle and also multiple seated rest breaks due to BLE fatigue.  Overall, pt tolerated well and needs continued skilled PT intervention.     Rehab Potential  Fair    PT Frequency  3x / week    PT Duration  6 weeks    PT Treatment/Interventions  ADLs/Self Care Home Management;Aquatic Therapy;DME Instruction;Gait training;Functional mobility training;Stair training;Therapeutic activities;Therapeutic exercise;Balance training;Neuromuscular re-education;Patient/family education;Orthotic Fit/Training;Wheelchair mobility training;Manual techniques;Passive range of motion;Energy conservation;Dry needling;Splinting;Taping    PT Next Visit Plan  Continue with Nustep, gait training with RW and wc follow, hurdle step over in // bars and step ups.  Perform standing w/o UE support in // bars for endurance training and add cone taps and coordination drills/exercises (ladder). Progress Ai Chi and yoga poses in pool for flexibility and endurance training.    PT Home Exercise Plan  08/16/18:  seated DF/PF, LAQ, marching, abd with theraband and bridge; 11/13: HS and calf stretching (says he has been performing this at home)    Consulted and Agree with Plan of Care  Patient       Patient will benefit from skilled therapeutic intervention in order to improve the following deficits and impairments:  Abnormal gait, Decreased balance, Decreased endurance, Decreased mobility, Difficulty walking, Hypomobility, Decreased range of motion, Decreased activity tolerance, Decreased strength, Impaired flexibility  Visit Diagnosis: Other abnormalities of gait and mobility  Other symptoms and signs involving the musculoskeletal system  Muscle weakness (generalized)     Problem List Patient Active Problem List   Diagnosis Date Noted  . Pseudogout of knee   . Pyogenic arthritis of right knee joint ([place])   . Falls frequently 05/08/2018  . Fall   . Sleep disturbance   . Post-op pain   . Hypokalemia   . Morbid obesity ([place])   . Postoperative pain   . DVT, lower extremity, distal,  acute, bilateral ([place])   . Acute blood loss anemia   . Anemia of chronic disease   . Essential hypertension   . Bacteremia   . Myelopathy ([place]) 03/30/2018  . Paraplegia ([place])   . Postlaminectomy syndrome, lumbar region   . Epidural abscess   . Abdominal distension   . Encephalopathy   . Weakness of both lower extremities   . Discitis of thoracic region   . Spinal cord stimulator status   . Staphylococcus aureus sepsis ([place]) 03/20/2018  . Bacteremia due to methicillin susceptible Staphylococcus aureus (MSSA) 03/18/2018  . Obesity 03/18/2018  . Nephrolithiasis 03/16/2018  . AKI (acute kidney injury) ([place]) 03/16/2018  . Cognitive impairment 03/16/2018  . Chronic pain syndrome 03/16/2018  . B12 deficiency 01/28/2016  . Memory difficulty 07/23/2015  . History of cerebrovascular disease 07/23/2015  . FH: colon cancer 08/13/2014  . Hx of adenomatous colonic polyps 08/13/2014  . Chronic back pain 05/30/2012  . CAD, NATIVE VESSEL 07/17/2010  . HYPERCHOLESTEROLEMIA 10/31/2009  . SMOKELESS TOBACCO ABUSE 10/31/2009  . Obstructive sleep apnea 10/31/2009  . Essential hypertension, benign 10/31/2009  . GERD 10/31/2009        Geraldine Solar PT, DPT  [facility] [address] [place], [place], [address] Phone: [phone]   Fax:  [phone]  Name: Cody Velez MRN: [id number]  Date of Birth: 04-Mar-1946

## 2018-11-03 ENCOUNTER — Encounter (HOSPITAL_COMMUNITY): Payer: Self-pay

## 2018-11-03 ENCOUNTER — Ambulatory Visit (HOSPITAL_COMMUNITY): Payer: Medicare Other

## 2018-11-03 DIAGNOSIS — R29898 Other symptoms and signs involving the musculoskeletal system: Secondary | ICD-10-CM | POA: Diagnosis not present

## 2018-11-03 DIAGNOSIS — R2689 Other abnormalities of gait and mobility: Secondary | ICD-10-CM

## 2018-11-03 DIAGNOSIS — M6281 Muscle weakness (generalized): Secondary | ICD-10-CM | POA: Diagnosis not present

## 2018-11-03 NOTE — Therapy (Signed)
[place], [place], [address] Phone: [phone]   Fax:  [phone]   Progress Note Reporting Period 10/13/18 to 11/03/18  See note below for Objective Data and Assessment of Progress/Goals.    Physical Therapy Treatment  Patient Details  Name: Cody Velez MRN: [id number] Date of Birth: 04-18-1946 Referring Provider (PT): Asencion Noble, MD   Encounter Date: 11/03/2018  PT End of Session - 11/03/18 0852    Visit Number  30    Number of Visits  43    Date for PT Re-Evaluation  11/24/18   Minireassess completed 11/03/18   Authorization Type  Primary: Medicare; Secondary: BCBS (75 visits per calendar year with 14 used at evaluation)    Authorization Time Period  08/15/18 - 10/14/18; NEW: 10/13/18 to 11/24/18    Authorization - Visit Number  58   [place] - Number of Visits  46    PT Start Time  0900    PT Stop Time  0941    PT Time Calculation (min)  41 min    Equipment Utilized During Treatment  Gait belt    Activity Tolerance  Patient tolerated treatment well    Behavior During Therapy  [facility] for tasks assessed/performed       Past Medical History:  Diagnosis Date  . Allergic rhinitis   . Arthritis   . B12 deficiency   . Cervicogenic headache 01/28/2016  . Childhood asthma   . Chronic back pain   . Coronary atherosclerosis of native coronary artery    Mild nonobstructive CAD at catheterization January 2015  . Depression   . Essential hypertension   . Falls   . GERD (gastroesophageal reflux disease)   . History of cerebrovascular disease 07/23/2015  . History of pneumonia 02/2011  . Hyperlipidemia   . Kidney stone   . Memory difficulty 07/23/2015  . OSA (obstructive sleep apnea)    CPAP - Dr. [name]  . Prostate cancer ([place])   . PTSD (post-traumatic stress disorder)    [place]  . Rectal bleeding     Past Surgical History:  Procedure Laterality Date  . APPLICATION OF ROBOTIC ASSISTANCE  FOR SPINAL PROCEDURE N/A 03/28/2018   Procedure: APPLICATION OF ROBOTIC ASSISTANCE FOR SPINAL PROCEDURE;  Surgeon: Kristeen Miss, MD;  Location: [facility];  Service: Neurosurgery;  Laterality: N/A;  . BACK SURGERY  02/14/12   lumbar OR #7; "today redid L1L2; replaced screws; added bone from hip"  . BILATERAL KNEE ARTHROSCOPY    . COLONOSCOPY  10/15/2008   Dr. [name]: tubular adenoma   . COLONOSCOPY  12/17/2003   MOQ:HUTMLY rectal and colon  . COLONOSCOPY N/A 09/05/2014   Procedure: COLONOSCOPY;  Surgeon: Daneil Dolin, MD;  [facility];  Service: Endoscopy;  Laterality: N/A;  7:30-rescheduled 9/17 to [place] notified pt  . CYSTOSCOPY WITH STENT PLACEMENT Right 01/27/2016   Procedure: CYSTOSCOPY WITH STENT PLACEMENT;  Surgeon: [name], MD;  Location: [facility];  Service: Urology;  Laterality: Right;  . CYSTOSCOPY/RETROGRADE/URETEROSCOPY/STONE EXTRACTION WITH BASKET Right 01/27/2016   Procedure: CYSTOSCOPY, RIGHT RETROGRADE, RIGHT URETEROSCOPY, STONE EXTRACTION ;  Surgeon: [name], MD;  Location: [facility];  Service: Urology;  Laterality: Right;  . ESOPHAGOGASTRODUODENOSCOPY  10/15/2008     Dr Rourk:Schatzki's ring status post dilation and disruption via 47 F Maloney dilator/ otherwise unremarkable esophagus, small hiatal hernia, multiple fundal gland polyps not manipulated, gastritis, negative H.pylori  . ESOPHAGOGASTRODUODENOSCOPY  06/21/02  HBZ:JIRCV sliding hiatal hernia with mild changes of reflux esophagitis limited to gastroesophageal junction.  Noncritical ring at distal esophagus, 3 cm proximal to gastroesophageal junction/Antral gastritis  . [place]   "broke face playing softball"  . FRACTURE SURGERY     "left wrist; broke it; took spur off"  . HOLMIUM LASER APPLICATION Right 07/28/3809   Procedure: HOLMIUM LASER APPLICATION;  Surgeon: [name], MD;  Location: [facility];  Service: Urology;  Laterality: Right;  . KNEE ARTHROSCOPY Right  05/18/2018   Procedure: PARTIAL MEDIAL MENISECTOMY AND SURGICAL LAVAGE AND CHONDROPLASTY;  Surgeon: [name], MD;  Location: [facility];  Service: Orthopedics;  Laterality: Right;  . LEFT HEART CATHETERIZATION WITH CORONARY ANGIOGRAM N/A 12/27/2013   Procedure: LEFT HEART CATHETERIZATION WITH CORONARY ANGIOGRAM;  Surgeon: [name], MD;  Location: [facility] CATH LAB;  Service: Cardiovascular;  Laterality: N/A;  . neck epidural    . POSTERIOR LUMBAR FUSION 4 LEVEL N/A 03/28/2018   Procedure: Thoracic eight -Lumbar two- FIXATION WITH SCREW PLACEMENT, DECOMPRESSION Thoracic ten-Thoracic eleven  FOR OSTEOMYELITIS;  Surgeon: Kristeen Miss, MD;  Location: [facility];  Service: Neurosurgery;  Laterality: N/A;  . SHOULDER SURGERY Bilateral   . TEE WITHOUT CARDIOVERSION N/A 03/21/2018   Procedure: TRANSESOPHAGEAL ECHOCARDIOGRAM (TEE) WITH PROPOFOL;  Surgeon: [name], MD;  [facility];  Service: Cardiovascular;  Laterality: N/A;    There were no vitals filed for this visit.  Subjective Assessment - 11/03/18 0901    Subjective  Pt reports that his legs feel weak this morning. He woke up with them feeling weak. Sometimes they do this.     Pertinent History  T10-11 Epidural Abscess, back surgery on 03/28/18, right knee partial medial menisectomy and chondroplasty    Limitations  Standing;Walking;House hold activities;Lifting    Diagnostic tests  CT Lumbar Spine 05/12/18: "Spinal stimulator device, along with multilevel multisegment fusion hardware appear appropriately placed without adverse features."    Patient Stated Goals  To walk    Currently in Pain?  No/denies         [facility] PT Assessment - 11/03/18 0001      Assessment   Medical Diagnosis  T10-11 Epidural Abscess "Paraplegia"    Referring Provider (PT)  Asencion Noble, MD    Onset Date/Surgical Date  03/28/18   back surgery   Next MD Visit  11/25 or 11/26    Prior Therapy  Yes, inpatient rehab and home health which patient reported ended  08/04/18      Strength   Right Hip Flexion  4/5   was 4   Right Hip Extension  2+/5   was 2+   Right Hip ABduction  3/5   was 3-   Left Hip Flexion  4/5   was 4-   Left Hip Extension  2+/5   was 2+   Left Hip ABduction  3-/5   was 3-   Right Knee Flexion  4+/5   was 4+   Right Knee Extension  5/5   was 4+   Left Knee Flexion  4+/5   was 4+   Left Knee Extension  4+/5   was 4+   Right Ankle Dorsiflexion  5/5   was 4+   Left Ankle Dorsiflexion  4+/5   was 4+     Ambulation/Gait   Ambulation/Gait  Yes    Ambulation/Gait Assistance  4: Min assist;3: Mod assist    Ambulation/Gait Assistance Details  w/c follow for safety  Ambulation Distance (Feet)  64 Feet   total; L knee buckling more today   Assistive device  Rolling walker    Gait Pattern  Decreased dorsiflexion - left;Decreased dorsiflexion - right;Trunk flexed;Decreased step length - right;Decreased step length - left;Step-through pattern    Gait Comments  increased L knee buckling today and L knee pain limiting ambulation today            [facility] Adult PT Treatment/Exercise - 11/03/18 0001      Exercises   Exercises  Knee/Hip      Knee/Hip Exercises: Aerobic   Nustep  x10 mins, seat 14, hills profile 2 xL4, for reciprocal gait training and mm strength and endurance      Knee/Hip Exercises: Seated   Other Seated Knee/Hip Exercises  Seated marching over 6'' hurdle x 15 each LE, intermittent foot contact with hurdle    Other Seated Knee/Hip Exercises  seated cone taps BLE x5RT            PT Education - 11/03/18 0853    Education Details  reassessment findings    Person(s) Educated  Patient    Methods  Explanation;Demonstration    Comprehension  Verbalized understanding       PT Short Term Goals - 11/03/18 0855      PT SHORT TERM GOAL #1   Title  Patient will report understanding and regular compliance with HEP to improve strength and overall functional mobility.     Baseline  11/15: Patient  stated he has not been doing his exercises at home as often as he should.     Time  4    Period  Weeks    Status  Partially Met      PT SHORT TERM GOAL #2   Title  Patient will demonstrate improvement of 1/2 MMT strength grade in all muscle groups tested to assist patient with safe transfers, ambulation, and standing.     Baseline  11/15: see MMT    Time  4    Period  Weeks    Status  Partially Met      PT SHORT TERM GOAL #3   Title  Patient will report or demonstrate ability to perform ambulation for 2 minutes with LRAD with no more than supervision assistance without rest break and reported no more than minimal difficulty.     Baseline  11/15: amb 55sec and then 1:18 this date with RW and min-mod A due to L knee buckle and BLE weakness; last reassessment was 2:30 straight wiht RW    Time  4    Period  Weeks    Status  On-going      PT SHORT TERM GOAL #4   Title  Patient will demonstrate ability to ambulate 60 feet without rest break using LRAD with no more than minimal assistance.    Time  2    Period  Weeks    Status  Achieved        PT Long Term Goals - 11/03/18 2536      PT LONG TERM GOAL #1   Title  Patient will report or demonstrate ability to perform ambulation for 5 minutes with LRAD with no more than supervision assistance without rest break and reported no more than minimal difficulty.     Baseline  11/15: amb 55sec and then 1:18 this date with RW and min-mod A due to L knee buckle and BLE weakness; last reassessment was 2:30 straight wiht RW  Time  8    Period  Weeks    Status  On-going      PT LONG TERM GOAL #2   Title  Patient will report ability to stand at a support surface for 10 minutes without a rest break in order to perform household chores with increased ease.     Baseline  11/15: 3-57mns (maybe longer, not sure)    Time  8    Period  Weeks    Status  On-going      PT LONG TERM GOAL #3   Title  Patient will demonstrate ability to ambulate 120 feet  without rest break using LRAD with no more than minimal assistance.    Baseline  11/15: amb 55sec (273f and then 1:18(4041fthis date with RW and min-mod A due to L knee buckle and BLE weakness; last reassessment was 2:30 straight wiht RW    Time  4    Period  Weeks    Status  On-going            Plan - 11/03/18 1212    Clinical Impression Statement  PT reassessed pt's goals and outcome measures this date. Pt's ambulation more limited this date compared to last reassessment due to c/o BLE weakness, L knee buckling, and L knee pain. His MMT has slightly improved but is still limited. He personally reports his ability to stand with support is slightly better but he is not sure how long he can stand with support. Overall, pt is making steady progress and needs continued skilled PT intervention in order to maximize independence at home and overall QOL. Ended with Nustep and seated hip strengthening and coordination. Pt more challenged with cone taps on RLE compared to LLE due to coordination deficits. Continue as planned, progressing as able.     Rehab Potential  Fair    PT Frequency  3x / week    PT Duration  6 weeks    PT Treatment/Interventions  ADLs/Self Care Home Management;Aquatic Therapy;DME Instruction;Gait training;Functional mobility training;Stair training;Therapeutic activities;Therapeutic exercise;Balance training;Neuromuscular re-education;Patient/family education;Orthotic Fit/Training;Wheelchair mobility training;Manual techniques;Passive range of motion;Energy conservation;Dry needling;Splinting;Taping    PT Next Visit Plan  Continue with Nustep, gait training with RW and wc follow, hurdle step over in // bars and step ups.  Perform standing w/o UE support in // bars for endurance training and add cone taps and coordination drills/exercises (ladder). Progress Ai Chi and yoga poses in pool for flexibility and endurance training.    PT Home Exercise Plan  08/16/18:  seated DF/PF, LAQ,  marching, abd with theraband and bridge; 11/13: HS and calf stretching (says he has been performing this at home)    Consulted and Agree with Plan of Care  Patient       Patient will benefit from skilled therapeutic intervention in order to improve the following deficits and impairments:  Abnormal gait, Decreased balance, Decreased endurance, Decreased mobility, Difficulty walking, Hypomobility, Decreased range of motion, Decreased activity tolerance, Decreased strength, Impaired flexibility  Visit Diagnosis: Other abnormalities of gait and mobility  Other symptoms and signs involving the musculoskeletal system  Muscle weakness (generalized)     Problem List Patient Active Problem List   Diagnosis Date Noted  . Pseudogout of knee   . Pyogenic arthritis of right knee joint ([place] . Falls frequently 05/08/2018  . Fall   . Sleep disturbance   . Post-op pain   . Hypokalemia   . Morbid obesity ([facility] . Postoperative pain   .  DVT, lower extremity, distal, acute, bilateral ([place])   . Acute blood loss anemia   . Anemia of chronic disease   . Essential hypertension   . Bacteremia   . Myelopathy ([place]) 03/30/2018  . Paraplegia ([place])   . Postlaminectomy syndrome, lumbar region   . Epidural abscess   . Abdominal distension   . Encephalopathy   . Weakness of both lower extremities   . Discitis of thoracic region   . Spinal cord stimulator status   . Staphylococcus aureus sepsis ([place]) 03/20/2018  . Bacteremia due to methicillin susceptible Staphylococcus aureus (MSSA) 03/18/2018  . Obesity 03/18/2018  . Nephrolithiasis 03/16/2018  . AKI (acute kidney injury) ([place]) 03/16/2018  . Cognitive impairment 03/16/2018  . Chronic pain syndrome 03/16/2018  . B12 deficiency 01/28/2016  . Memory difficulty 07/23/2015  . History of cerebrovascular disease 07/23/2015  . FH: colon cancer 08/13/2014  . Hx of adenomatous colonic polyps 08/13/2014  . Chronic back pain 05/30/2012  . CAD, NATIVE  VESSEL 07/17/2010  . HYPERCHOLESTEROLEMIA 10/31/2009  . SMOKELESS TOBACCO ABUSE 10/31/2009  . Obstructive sleep apnea 10/31/2009  . Essential hypertension, benign 10/31/2009  . GERD 10/31/2009        Geraldine Solar PT, DPT  [facility] [address] [place], [place], [address] Phone: [phone]   Fax:  [phone]  Name: DELFORD WINGERT MRN: [id number] Date of Birth: 1946/01/05

## 2018-11-06 ENCOUNTER — Other Ambulatory Visit: Payer: Self-pay | Admitting: Internal Medicine

## 2018-11-06 ENCOUNTER — Other Ambulatory Visit: Payer: Self-pay

## 2018-11-06 ENCOUNTER — Ambulatory Visit (HOSPITAL_COMMUNITY): Payer: Medicare Other

## 2018-11-06 ENCOUNTER — Encounter (HOSPITAL_COMMUNITY): Payer: Self-pay

## 2018-11-06 DIAGNOSIS — M6281 Muscle weakness (generalized): Secondary | ICD-10-CM | POA: Diagnosis not present

## 2018-11-06 DIAGNOSIS — R29898 Other symptoms and signs involving the musculoskeletal system: Secondary | ICD-10-CM

## 2018-11-06 DIAGNOSIS — R2689 Other abnormalities of gait and mobility: Secondary | ICD-10-CM

## 2018-11-06 DIAGNOSIS — M4644 Discitis, unspecified, thoracic region: Secondary | ICD-10-CM

## 2018-11-06 NOTE — Therapy (Signed)
[place], [place], [address] Phone: [phone]   Fax:  [phone]  Physical Therapy Aquatic Treatment  Patient Details  Name: Cody Velez MRN: [id number] Date of Birth: [birth year] Referring Provider (PT): Asencion Noble, MD   Encounter Date: 11/06/2018  PT End of Session - 11/06/18 1636    Visit Number  31    Number of Visits  43    Date for PT Re-Evaluation  11/24/18   Minireassess completed 11/03/18   Authorization Type  Primary: Medicare; Secondary: BCBS (75 visits per calendar year with 14 used at evaluation)    Authorization Time Period  08/15/18 - 10/14/18; NEW: 10/13/18 to 11/24/18    Authorization - Visit Number  35   KX   Authorization - Number of Visits  75    PT Start Time  4315    PT Stop Time  1428    PT Time Calculation (min)  47 min    Equipment Utilized During Treatment  Gait belt    Activity Tolerance  Patient tolerated treatment well    Behavior During Therapy  WFL for tasks assessed/performed       Past Medical History:  Diagnosis Date  . Allergic rhinitis   . Arthritis   . B12 deficiency   . Cervicogenic headache 01/28/2016  . Childhood asthma   . Chronic back pain   . Coronary atherosclerosis of native coronary artery    Mild nonobstructive CAD at catheterization January 2015  . Depression   . Essential hypertension   . Falls   . GERD (gastroesophageal reflux disease)   . History of cerebrovascular disease 07/23/2015  . History of pneumonia 02/2011  . Hyperlipidemia   . Kidney stone   . Memory difficulty 07/23/2015  . OSA (obstructive sleep apnea)    CPAP - Dr. [name]  . Prostate cancer ([place])   . PTSD (post-traumatic stress disorder)    [place]  . Rectal bleeding     Past Surgical History:  Procedure Laterality Date  . APPLICATION OF ROBOTIC ASSISTANCE FOR SPINAL PROCEDURE N/A 03/28/2018   Procedure: APPLICATION OF ROBOTIC ASSISTANCE FOR SPINAL PROCEDURE;  Surgeon: Kristeen Miss, MD;  Location: [facility];  Service: Neurosurgery;  Laterality: N/A;  . BACK SURGERY  02/14/12   lumbar OR #7; "today redid L1L2; replaced screws; added bone from hip"  . BILATERAL KNEE ARTHROSCOPY    . COLONOSCOPY  10/15/2008   Dr. [name]: tubular adenoma   . COLONOSCOPY  12/17/2003   QMG:QQPYPP rectal and colon  . COLONOSCOPY N/A 09/05/2014   Procedure: COLONOSCOPY;  Surgeon: Daneil Dolin, MD;  [facility];  Service: Endoscopy;  Laterality: N/A;  7:30-rescheduled 9/17 to [place] notified pt  . CYSTOSCOPY WITH STENT PLACEMENT Right 01/27/2016   Procedure: CYSTOSCOPY WITH STENT PLACEMENT;  Surgeon: [name], MD;  Location: [facility];  Service: Urology;  Laterality: Right;  . CYSTOSCOPY/RETROGRADE/URETEROSCOPY/STONE EXTRACTION WITH BASKET Right 01/27/2016   Procedure: CYSTOSCOPY, RIGHT RETROGRADE, RIGHT URETEROSCOPY, STONE EXTRACTION ;  Surgeon: [name], MD;  Location: [facility];  Service: Urology;  Laterality: Right;  . ESOPHAGOGASTRODUODENOSCOPY  10/15/2008     Dr Rourk:Schatzki's ring status post dilation and disruption via 75 F Maloney dilator/ otherwise unremarkable esophagus, small hiatal hernia, multiple fundal gland polyps not manipulated, gastritis, negative H.pylori  . ESOPHAGOGASTRODUODENOSCOPY  06/21/02   JKD:TOIZT sliding hiatal hernia with mild changes of reflux esophagitis limited to gastroesophageal junction.  Noncritical ring at distal esophagus,  3 cm proximal to gastroesophageal junction/Antral gastritis  . [place]   "broke face playing softball"  . FRACTURE SURGERY     "left wrist; broke it; took spur off"  . HOLMIUM LASER APPLICATION Right 07/28/3733   Procedure: HOLMIUM LASER APPLICATION;  Surgeon: [name], MD;  Location: [facility];  Service: Urology;  Laterality: Right;  . KNEE ARTHROSCOPY Right 05/18/2018   Procedure: PARTIAL MEDIAL MENISECTOMY AND SURGICAL LAVAGE AND CHONDROPLASTY;  Surgeon: [name], MD;   Location: [facility];  Service: Orthopedics;  Laterality: Right;  . LEFT HEART CATHETERIZATION WITH CORONARY ANGIOGRAM N/A 12/27/2013   Procedure: LEFT HEART CATHETERIZATION WITH CORONARY ANGIOGRAM;  Surgeon: [name], MD;  Location: [facility] C[facility];  Service: Cardiovascular;  Laterality: N/A;  . neck epidural    . POSTERIOR LUMBAR FUSION 4 LEVEL N/A 03/28/2018   Procedure: Thoracic eight -Lumbar two- FIXATION WITH SCREW PLACEMENT, DECOMPRESSION Thoracic ten-Thoracic eleven  FOR OSTEOMYELITIS;  Surgeon: Kristeen Miss, MD;  Location: [facility];  Service: Neurosurgery;  Laterality: N/A;  . SHOULDER SURGERY Bilateral   . TEE WITHOUT CARDIOVERSION N/A 03/21/2018   Procedure: TRANSESOPHAGEAL ECHOCARDIOGRAM (TEE) WITH PROPOFOL;  Surgeon: [name], MD;  [facility];  Service: Cardiovascular;  Laterality: N/A;    There were no vitals filed for this visit.  Subjective Assessment - 11/06/18 1626    Subjective  Patient reports he is feeling but his legs are still a little weak feeling like last week. He denies pain but states he has been having more numbness/tingling in his LE's.    Pertinent History  T10-11 Epidural Abscess, back surgery on 03/28/18, right knee partial medial menisectomy and chondroplasty    Limitations  Standing;Walking;House hold activities;Lifting    Diagnostic tests  CT Lumbar Spine 05/12/18: "Spinal stimulator device, along with multilevel multisegment fusion hardware appear appropriately placed without adverse features."    Patient Stated Goals  To walk    Currently in Pain?  No/denies       Transfers:  Squat pivot transfer performed 2xwith set up assistance and Mod I levelfrom manual wheelchair to pool chair lift. Patient utilized pool chair lift to get in/out of pool for therapy session   Adult Aquatic Therapy - 11/06/18 1628      Aquatic Therapy Subjective   Subjective  Patient reports he is feeling but his legs are still a little weak feeling like last week. He  denies pain but states he has been having more numbness/tingling in his LE's.      Treatment   Gait   4x 20' forward gait without UE support. Patient ambulated throughout session without UE assistance    Exercises  Performed in Deep water with floatation belt and hand floats: 3x 1 minute bicycle kicks; 3x 1 minute straight LE scissor kicks, 2x 1 minute jumping jacks (hip abd/add), 2x 1 minute treading water without arm floats. 2x kick to wall (12') with arm floats and min assistance for posture.     Specific Exercises  Hip/Low Back    Hip/Low Back  2x 10 kicks at wall for strengthening, patient required assistance for posture with maintaining hips elevated. 1x 15 reps bil LE back leg pull with bil UE support at wall. 1x 20 reps bil LE heel raise at wall. Seated hamstring stretch, 2x 30 seconds bil LE's. Noodle press 1x 10 reps bil LE's wioht 1x HHA.         PT Education - 11/06/18 1636  Education Details  Educated on posture for deep water exercises.    Person(s) Educated  Patient    Methods  Explanation;Verbal cues    Comprehension  Verbalized understanding;Returned demonstration       PT Short Term Goals - 11/03/18 0855      PT SHORT TERM GOAL #1   Title  Patient will report understanding and regular compliance with HEP to improve strength and overall functional mobility.     Baseline  11/15: Patient stated he has not been doing his exercises at home as often as he should.     Time  4    Period  Weeks    Status  Partially Met      PT SHORT TERM GOAL #2   Title  Patient will demonstrate improvement of 1/2 MMT strength grade in all muscle groups tested to assist patient with safe transfers, ambulation, and standing.     Baseline  11/15: see MMT    Time  4    Period  Weeks    Status  Partially Met      PT SHORT TERM GOAL #3   Title  Patient will report or demonstrate ability to perform ambulation for 2 minutes with LRAD with no more than supervision assistance without rest break  and reported no more than minimal difficulty.     Baseline  11/15: amb 55sec and then 1:18 this date with RW and min-mod A due to L knee buckle and BLE weakness; last reassessment was 2:30 straight wiht RW    Time  4    Period  Weeks    Status  On-going      PT SHORT TERM GOAL #4   Title  Patient will demonstrate ability to ambulate 60 feet without rest break using LRAD with no more than minimal assistance.    Time  2    Period  Weeks    Status  Achieved        PT Long Term Goals - 11/03/18 2620      PT LONG TERM GOAL #1   Title  Patient will report or demonstrate ability to perform ambulation for 5 minutes with LRAD with no more than supervision assistance without rest break and reported no more than minimal difficulty.     Baseline  11/15: amb 55sec and then 1:18 this date with RW and min-mod A due to L knee buckle and BLE weakness; last reassessment was 2:30 straight wiht RW    Time  8    Period  Weeks    Status  On-going      PT LONG TERM GOAL #2   Title  Patient will report ability to stand at a support surface for 10 minutes without a rest break in order to perform household chores with increased ease.     Baseline  11/15: 3-44mns (maybe longer, not sure)    Time  8    Period  Weeks    Status  On-going      PT LONG TERM GOAL #3   Title  Patient will demonstrate ability to ambulate 120 feet without rest break using LRAD with no more than minimal assistance.    Baseline  11/15: amb 55sec (220f and then 1:18(4024fthis date with RW and min-mod A due to L knee buckle and BLE weakness; last reassessment was 2:30 straight wiht RW    Time  4    Period  Weeks    Status  On-going  Plan - 11/06/18 1637    Clinical Impression Statement  Continued with aquatic therapy session and patient continues to progress pool ambulation with no assistance this session. He continues to have scissoring gait intermittently but has improved ability to correct steps. He also had 1 episode  of Lt knee buckling as he began walking in shallower water to perform LE exercises. This session initiated deep water cardio endurance and LE strengthening exercises as this area of the pool was available. Patient was able to maintain upright posture with floatation belt and hand floats for repeated intervals. He did have difficulty with achieving posture to kick to propel towards wall and swim around pool requiring min assist. Overall he is continuing to improve and will continue to benefit from skilled PT interventions to address impairments in aquatic and land-based setting to improve QOL and independence at home.    Rehab Potential  Fair    PT Frequency  3x / week    PT Duration  6 weeks    PT Treatment/Interventions  ADLs/Self Care Home Management;Aquatic Therapy;DME Instruction;Gait training;Functional mobility training;Stair training;Therapeutic activities;Therapeutic exercise;Balance training;Neuromuscular re-education;Patient/family education;Orthotic Fit/Training;Wheelchair mobility training;Manual techniques;Passive range of motion;Energy conservation;Dry needling;Splinting;Taping    PT Next Visit Plan  Continue with Nustep at EOS, gait training with RW and wc follow, hurdle step over in // bars and step ups.  Perform standing w/o UE support in // bars for endurance training and add cone taps and coordination drills/exercises (ladder). Progress Ai Chi and yoga poses in pool for flexibility and endurance training.    PT Home Exercise Plan  08/16/18:  seated DF/PF, LAQ, marching, abd with theraband and bridge; 11/13: HS and calf stretching (says he has been performing this at home)    Consulted and Agree with Plan of Care  Patient       Patient will benefit from skilled therapeutic intervention in order to improve the following deficits and impairments:  Abnormal gait, Decreased balance, Decreased endurance, Decreased mobility, Difficulty walking, Hypomobility, Decreased range of motion, Decreased  activity tolerance, Decreased strength, Impaired flexibility  Visit Diagnosis: Other abnormalities of gait and mobility  Other symptoms and signs involving the musculoskeletal system  Muscle weakness (generalized)     Problem List Patient Active Problem List   Diagnosis Date Noted  . Pseudogout of knee   . Pyogenic arthritis of right knee joint ([place])   . Falls frequently 05/08/2018  . Fall   . Sleep disturbance   . Post-op pain   . Hypokalemia   . Morbid obesity ([place])   . Postoperative pain   . DVT, lower extremity, distal, acute, bilateral ([place])   . Acute blood loss anemia   . Anemia of chronic disease   . Essential hypertension   . Bacteremia   . Myelopathy ([place]) 03/30/2018  . Paraplegia ([place])   . Postlaminectomy syndrome, lumbar region   . Epidural abscess   . Abdominal distension   . Encephalopathy   . Weakness of both lower extremities   . Discitis of thoracic region   . Spinal cord stimulator status   . Staphylococcus aureus sepsis ([place]) 03/20/2018  . Bacteremia due to methicillin susceptible Staphylococcus aureus (MSSA) 03/18/2018  . Obesity 03/18/2018  . Nephrolithiasis 03/16/2018  . AKI (acute kidney injury) ([place]) 03/16/2018  . Cognitive impairment 03/16/2018  . Chronic pain syndrome 03/16/2018  . B12 deficiency 01/28/2016  . Memory difficulty 07/23/2015  . History of cerebrovascular disease 07/23/2015  . FH: colon cancer 08/13/2014  . Hx of adenomatous  colonic polyps 08/13/2014  . Chronic back pain 05/30/2012  . CAD, NATIVE VESSEL 07/17/2010  . HYPERCHOLESTEROLEMIA 10/31/2009  . SMOKELESS TOBACCO ABUSE 10/31/2009  . Obstructive sleep apnea 10/31/2009  . Essential hypertension, benign 10/31/2009  . GERD 10/31/2009    Kipp Brood, PT, DPT Physical Therapist with [facility]  11/06/2018 4:42 PM    [place], [place], [address] Phone: [phone]    Fax:  [phone]  Name: Cody Velez MRN: [id number] Date of Birth: [birth year]

## 2018-11-08 ENCOUNTER — Ambulatory Visit (HOSPITAL_COMMUNITY): Payer: Medicare Other

## 2018-11-08 ENCOUNTER — Encounter (HOSPITAL_COMMUNITY): Payer: Self-pay

## 2018-11-08 ENCOUNTER — Other Ambulatory Visit: Payer: Self-pay

## 2018-11-08 DIAGNOSIS — M6281 Muscle weakness (generalized): Secondary | ICD-10-CM | POA: Diagnosis not present

## 2018-11-08 DIAGNOSIS — E876 Hypokalemia: Secondary | ICD-10-CM | POA: Diagnosis not present

## 2018-11-08 DIAGNOSIS — R29898 Other symptoms and signs involving the musculoskeletal system: Secondary | ICD-10-CM | POA: Diagnosis not present

## 2018-11-08 DIAGNOSIS — E785 Hyperlipidemia, unspecified: Secondary | ICD-10-CM | POA: Diagnosis not present

## 2018-11-08 DIAGNOSIS — I10 Essential (primary) hypertension: Secondary | ICD-10-CM | POA: Diagnosis not present

## 2018-11-08 DIAGNOSIS — K219 Gastro-esophageal reflux disease without esophagitis: Secondary | ICD-10-CM | POA: Diagnosis not present

## 2018-11-08 DIAGNOSIS — Z125 Encounter for screening for malignant neoplasm of prostate: Secondary | ICD-10-CM | POA: Diagnosis not present

## 2018-11-08 DIAGNOSIS — R2689 Other abnormalities of gait and mobility: Secondary | ICD-10-CM

## 2018-11-08 DIAGNOSIS — I2510 Atherosclerotic heart disease of native coronary artery without angina pectoris: Secondary | ICD-10-CM | POA: Diagnosis not present

## 2018-11-08 NOTE — Therapy (Signed)
[place], [place], [address] Phone: [phone]   Fax:  [phone]  Physical Therapy Treatment  Patient Details  Name: Cody Velez MRN: [id number] Date of Birth: 1946-04-20 Referring Provider (PT): Asencion Noble, MD   Encounter Date: 11/08/2018  PT End of Session - 11/08/18 1219    Visit Number  32    Number of Visits  43    Date for PT Re-Evaluation  11/24/18   Minireassess completed 11/03/18   Authorization Type  Primary: Medicare; Secondary: BC[employer] (75 visits per calendar year with 14 used at evaluation)    Authorization Time Period  08/15/18 - 10/14/18; NEW: 10/13/18 to 11/24/18    Authorization - Visit Number  13   KX   Authorization - Number of Visits  75    PT Start Time  1120    PT Stop Time  9983    PT Time Calculation (min)  44 min    Equipment Utilized During Treatment  Gait belt    Activity Tolerance  Patient tolerated treatment well    Behavior During Therapy  WFL for tasks assessed/performed       Past Medical History:  Diagnosis Date  . Allergic rhinitis   . Arthritis   . B12 deficiency   . Cervicogenic headache 01/28/2016  . Childhood asthma   . Chronic back pain   . Coronary atherosclerosis of native coronary artery    Mild nonobstructive CAD at catheterization January 2015  . Depression   . Essential hypertension   . Falls   . GERD (gastroesophageal reflux disease)   . History of cerebrovascular disease 07/23/2015  . History of pneumonia 02/2011  . Hyperlipidemia   . Kidney stone   . Memory difficulty 07/23/2015  . OSA (obstructive sleep apnea)    CPAP - Dr. [name]  . Prostate cancer ([place])   . PTSD (post-traumatic stress disorder)    [place]  . Rectal bleeding     Past Surgical History:  Procedure Laterality Date  . APPLICATION OF ROBOTIC ASSISTANCE FOR SPINAL PROCEDURE N/A 03/28/2018   Procedure: APPLICATION OF ROBOTIC ASSISTANCE FOR SPINAL PROCEDURE;  Surgeon: Kristeen Miss, MD;   Location: [facility];  Service: Neurosurgery;  Laterality: N/A;  . BACK SURGERY  02/14/12   lumbar OR #7; "today redid L1L2; replaced screws; added bone from hip"  . BILATERAL KNEE ARTHROSCOPY    . COLONOSCOPY  10/15/2008   Dr. [name]: tubular adenoma   . COLONOSCOPY  12/17/2003   JAS:NKNLZJ rectal and colon  . COLONOSCOPY N/A 09/05/2014   Procedure: COLONOSCOPY;  Surgeon: Daneil Dolin, MD;  [facility];  Service: Endoscopy;  Laterality: N/A;  7:30-rescheduled 9/17 to [place] notified pt  . CYSTOSCOPY WITH STENT PLACEMENT Right 01/27/2016   Procedure: CYSTOSCOPY WITH STENT PLACEMENT;  Surgeon: [name], MD;  Location: [facility];  Service: Urology;  Laterality: Right;  . CYSTOSCOPY/RETROGRADE/URETEROSCOPY/STONE EXTRACTION WITH BASKET Right 01/27/2016   Procedure: CYSTOSCOPY, RIGHT RETROGRADE, RIGHT URETEROSCOPY, STONE EXTRACTION ;  Surgeon: [name], MD;  Location: [facility];  Service: Urology;  Laterality: Right;  . ESOPHAGOGASTRODUODENOSCOPY  10/15/2008     Dr Rourk:Schatzki's ring status post dilation and disruption via 37 F Maloney dilator/ otherwise unremarkable esophagus, small hiatal hernia, multiple fundal gland polyps not manipulated, gastritis, negative H.pylori  . ESOPHAGOGASTRODUODENOSCOPY  06/21/02   [facility]:ALPFX sliding hiatal hernia with mild changes of reflux esophagitis limited to gastroesophageal junction.  Noncritical ring at distal esophagus, 3  cm proximal to gastroesophageal junction/Antral gastritis  . [place]   "broke face playing softball"  . FRACTURE SURGERY     "left wrist; broke it; took spur off"  . HOLMIUM LASER APPLICATION Right 07/24/8849   Procedure: HOLMIUM LASER APPLICATION;  Surgeon: [name], MD;  Location: [facility];  Service: Urology;  Laterality: Right;  . KNEE ARTHROSCOPY Right 05/18/2018   Procedure: PARTIAL MEDIAL MENISECTOMY AND SURGICAL LAVAGE AND CHONDROPLASTY;  Surgeon: [name], MD;  Location:  [facility];  Service: Orthopedics;  Laterality: Right;  . LEFT HEART CATHETERIZATION WITH CORONARY ANGIOGRAM N/A 12/27/2013   Procedure: LEFT HEART CATHETERIZATION WITH CORONARY ANGIOGRAM;  Surgeon: [name], MD;  Location: [facility];  Service: Cardiovascular;  Laterality: N/A;  . neck epidural    . POSTERIOR LUMBAR FUSION 4 LEVEL N/A 03/28/2018   Procedure: Thoracic eight -Lumbar two- FIXATION WITH SCREW PLACEMENT, DECOMPRESSION Thoracic ten-Thoracic eleven  FOR OSTEOMYELITIS;  Surgeon: Kristeen Miss, MD;  Location: [facility];  Service: Neurosurgery;  Laterality: N/A;  . SHOULDER SURGERY Bilateral   . TEE WITHOUT CARDIOVERSION N/A 03/21/2018   Procedure: TRANSESOPHAGEAL ECHOCARDIOGRAM (TEE) WITH PROPOFOL;  Surgeon: [name], MD;  [facility];  Service: Cardiovascular;  Laterality: N/A;    There were no vitals filed for this visit.  Subjective Assessment - 11/08/18 1143    Subjective  Patient reports he is doing well and denies pain today. He asks about fixing his wheelchair brakes today.    Pertinent History  T10-11 Epidural Abscess, back surgery on 03/28/18, right knee partial medial menisectomy and chondroplasty    Limitations  Standing;Walking;House hold activities;Lifting    Diagnostic tests  CT Lumbar Spine 05/12/18: "Spinal stimulator device, along with multilevel multisegment fusion hardware appear appropriately placed without adverse features."    Patient Stated Goals  To walk    Currently in Pain?  No/denies        [facility] Adult PT Treatment/Exercise - 11/08/18 0001      Exercises   Exercises  Knee/Hip      Knee/Hip Exercises: Aerobic   Nustep  10 mins, Level 3, LE only, for reciprocal gait training and mm strength and endurance      Knee/Hip Exercises: Standing   Forward Step Up  Both;2 sets;5 reps;Hand Hold: 2;Step Height: 4"    Forward Step Up Limitations  min assist for safety in // bars; Pt with scisorring step back with Lt LE requiring to reposition prior to  stepping up again to improve BOS.    Other Standing Knee Exercises  sidestep in //bars 1RT, cueing for foot placement and upright posture       Balance Exercises - 11/08/18 1215      Balance Exercises: Standing   Standing Eyes Opened  Solid surface;4 reps;Cognitive challenge;Limitations   4x through ball toss with basketball net   Tandem Stance  Eyes open;Intermittent upper extremity support;2 reps;Cognitive challenge   alt foot align; 1x through ball toss to basketball net   Other Standing Exercises  NBOS for target with velcro ball toss without UE support, 8x through (2 balls)        PT Education - 11/08/18 1218    Education Details  Educated on standing posture and on weheelchair care with brakes.    Person(s) Educated  Patient    Methods  Explanation    Comprehension  Verbalized understanding;Returned demonstration       PT Short Term Goals - 11/03/18 [phone]  PT SHORT TERM GOAL #1   Title  Patient will report understanding and regular compliance with HEP to improve strength and overall functional mobility.     Baseline  11/15: Patient stated he has not been doing his exercises at home as often as he should.     Time  4    Period  Weeks    Status  Partially Met      PT SHORT TERM GOAL #2   Title  Patient will demonstrate improvement of 1/2 MMT strength grade in all muscle groups tested to assist patient with safe transfers, ambulation, and standing.     Baseline  11/15: see MMT    Time  4    Period  Weeks    Status  Partially Met      PT SHORT TERM GOAL #3   Title  Patient will report or demonstrate ability to perform ambulation for 2 minutes with LRAD with no more than supervision assistance without rest break and reported no more than minimal difficulty.     Baseline  11/15: amb 55sec and then 1:18 this date with RW and min-mod A due to L knee buckle and BLE weakness; last reassessment was 2:30 straight wiht RW    Time  4    Period  Weeks    Status  On-going       PT SHORT TERM GOAL #4   Title  Patient will demonstrate ability to ambulate 60 feet without rest break using LRAD with no more than minimal assistance.    Time  2    Period  Weeks    Status  Achieved        PT Long Term Goals - 11/03/18 8921      PT LONG TERM GOAL #1   Title  Patient will report or demonstrate ability to perform ambulation for 5 minutes with LRAD with no more than supervision assistance without rest break and reported no more than minimal difficulty.     Baseline  11/15: amb 55sec and then 1:18 this date with RW and min-mod A due to L knee buckle and BLE weakness; last reassessment was 2:30 straight wiht RW    Time  8    Period  Weeks    Status  On-going      PT LONG TERM GOAL #2   Title  Patient will report ability to stand at a support surface for 10 minutes without a rest break in order to perform household chores with increased ease.     Baseline  11/15: 3-20mns (maybe longer, not sure)    Time  8    Period  Weeks    Status  On-going      PT LONG TERM GOAL #3   Title  Patient will demonstrate ability to ambulate 120 feet without rest break using LRAD with no more than minimal assistance.    Baseline  11/15: amb 55sec (21f and then 1:18(4036fthis date with RW and min-mod A due to L knee buckle and BLE weakness; last reassessment was 2:30 straight wiht RW    Time  4    Period  Weeks    Status  On-going        Plan - 11/08/18 1219    Clinical Impression Statement  Therapy session focused on standing balance and endurance today. Patient performed multiple bouts of standing with normal stance, NBOS, and tandem while performing ball toss to Velcro target and basketball net. He required intermittent UE support  throughout and cues to straighten knees to maintain upright posture as he fatigued. He was able to toss the entire bucket of basketballs while maintaining tandem stance before requiring a rest break. He also performed functional step ups with Bil UE  support and side stepping in // bars. He did have an episode of Lt knee buckling during side stepping but was able to regain balance to step back to his chair. Patient ended session with NuStep for LE endurance strengthening and therapist adjusted his wheelchair brakes to tighten them as they have loosened and are not effective now. He reported the brakes were much better at EOS. He will continue to benefit from skilled PT interventions to address impairments and progress independence with mobility and QOL.    Rehab Potential  Fair    PT Frequency  3x / week    PT Duration  6 weeks    PT Treatment/Interventions  ADLs/Self Care Home Management;Aquatic Therapy;DME Instruction;Gait training;Functional mobility training;Stair training;Therapeutic activities;Therapeutic exercise;Balance training;Neuromuscular re-education;Patient/family education;Orthotic Fit/Training;Wheelchair mobility training;Manual techniques;Passive range of motion;Energy conservation;Dry needling;Splinting;Taping    PT Next Visit Plan  Continue with Nustep at EOS, gait training with RW and wc follow, hurdle step over in // bars and step ups.  Perform standing w/o UE support in // bars for endurance training and add cone taps and coordination drills/exercises (ladder). Progress Ai Chi and yoga poses in pool for flexibility and endurance training.    PT Home Exercise Plan  08/16/18:  seated DF/PF, LAQ, marching, abd with theraband and bridge; 11/13: HS and calf stretching (says he has been performing this at home)    Consulted and Agree with Plan of Care  Patient       Patient will benefit from skilled therapeutic intervention in order to improve the following deficits and impairments:  Abnormal gait, Decreased balance, Decreased endurance, Decreased mobility, Difficulty walking, Hypomobility, Decreased range of motion, Decreased activity tolerance, Decreased strength, Impaired flexibility  Visit Diagnosis: Other abnormalities of gait  and mobility  Other symptoms and signs involving the musculoskeletal system  Muscle weakness (generalized)     Problem List Patient Active Problem List   Diagnosis Date Noted  . Pseudogout of knee   . Pyogenic arthritis of right knee joint ([place])   . Falls frequently 05/08/2018  . Fall   . Sleep disturbance   . Post-op pain   . Hypokalemia   . Morbid obesity ([place])   . Postoperative pain   . DVT, lower extremity, distal, acute, bilateral ([place])   . Acute blood loss anemia   . Anemia of chronic disease   . Essential hypertension   . Bacteremia   . Myelopathy ([place]) 03/30/2018  . Paraplegia ([place])   . Postlaminectomy syndrome, lumbar region   . Epidural abscess   . Abdominal distension   . Encephalopathy   . Weakness of both lower extremities   . Discitis of thoracic region   . Spinal cord stimulator status   . Staphylococcus aureus sepsis ([place]) 03/20/2018  . Bacteremia due to methicillin susceptible Staphylococcus aureus (MSSA) 03/18/2018  . Obesity 03/18/2018  . Nephrolithiasis 03/16/2018  . AKI (acute kidney injury) ([place]) 03/16/2018  . Cognitive impairment 03/16/2018  . Chronic pain syndrome 03/16/2018  . B12 deficiency 01/28/2016  . Memory difficulty 07/23/2015  . History of cerebrovascular disease 07/23/2015  . FH: colon cancer 08/13/2014  . Hx of adenomatous colonic polyps 08/13/2014  . Chronic back pain 05/30/2012  . CAD, NATIVE VESSEL 07/17/2010  . HYPERCHOLESTEROLEMIA 10/31/2009  .  SMOKELESS TOBACCO ABUSE 10/31/2009  . Obstructive sleep apnea 10/31/2009  . Essential hypertension, benign 10/31/2009  . GERD 10/31/2009    Kipp Brood, PT, DPT Physical Therapist with [facility]  11/08/2018 12:27 PM    [facility] [address] [place], [place], [address] Phone: [phone]   Fax:  [phone]  Name: RANDY CASTREJON MRN: [id number] Date of Birth: 20-Nov-1946

## 2018-11-10 ENCOUNTER — Encounter (HOSPITAL_COMMUNITY): Payer: Self-pay | Admitting: Physical Therapy

## 2018-11-10 ENCOUNTER — Ambulatory Visit (HOSPITAL_COMMUNITY): Payer: Medicare Other | Admitting: Physical Therapy

## 2018-11-10 DIAGNOSIS — M6281 Muscle weakness (generalized): Secondary | ICD-10-CM

## 2018-11-10 DIAGNOSIS — R29898 Other symptoms and signs involving the musculoskeletal system: Secondary | ICD-10-CM

## 2018-11-10 DIAGNOSIS — R2689 Other abnormalities of gait and mobility: Secondary | ICD-10-CM

## 2018-11-10 NOTE — Therapy (Signed)
[place], [place], [address] Phone: [phone]   Fax:  [phone]  Physical Therapy Treatment  Patient Details  Name: Cody Velez MRN: [id number] Date of Birth: 30-Aug-1946 Referring Provider (PT): Asencion Noble, MD   Encounter Date: 11/10/2018  PT End of Session - 11/10/18 1138    Visit Number  33    Number of Visits  43    Date for PT Re-Evaluation  11/24/18   Minireassess completed 11/03/18   Authorization Type  Primary: Medicare; Secondary: BC[employer] (75 visits per calendar year with 14 used at evaluation)    Authorization Time Period  08/15/18 - 10/14/18; NEW: 10/13/18 to 11/24/18    Authorization - Visit Number  11   KX   Authorization - Number of Visits  75    PT Start Time  1120    PT Stop Time  1158    PT Time Calculation (min)  38 min    Equipment Utilized During Treatment  Gait belt    Activity Tolerance  Patient tolerated treatment well    Behavior During Therapy  WFL for tasks assessed/performed       Past Medical History:  Diagnosis Date  . Allergic rhinitis   . Arthritis   . B12 deficiency   . Cervicogenic headache 01/28/2016  . Childhood asthma   . Chronic back pain   . Coronary atherosclerosis of native coronary artery    Mild nonobstructive CAD at catheterization January 2015  . Depression   . Essential hypertension   . Falls   . GERD (gastroesophageal reflux disease)   . History of cerebrovascular disease 07/23/2015  . History of pneumonia 02/2011  . Hyperlipidemia   . Kidney stone   . Memory difficulty 07/23/2015  . OSA (obstructive sleep apnea)    CPAP - Dr. [name]  . Prostate cancer ([place])   . PTSD (post-traumatic stress disorder)    [place]  . Rectal bleeding     Past Surgical History:  Procedure Laterality Date  . APPLICATION OF ROBOTIC ASSISTANCE FOR SPINAL PROCEDURE N/A 03/28/2018   Procedure: APPLICATION OF ROBOTIC ASSISTANCE FOR SPINAL PROCEDURE;  Surgeon: Kristeen Miss, MD;   Location: [facility];  Service: Neurosurgery;  Laterality: N/A;  . BACK SURGERY  02/14/12   lumbar OR #7; "today redid L1L2; replaced screws; added bone from hip"  . BILATERAL KNEE ARTHROSCOPY    . COLONOSCOPY  10/15/2008   Dr. [name]: tubular adenoma   . COLONOSCOPY  12/17/2003   BSJ:GGEZMO rectal and colon  . COLONOSCOPY N/A 09/05/2014   Procedure: COLONOSCOPY;  Surgeon: Daneil Dolin, MD;  [facility];  Service: Endoscopy;  Laterality: N/A;  7:30-rescheduled 9/17 to [place] notified pt  . CYSTOSCOPY WITH STENT PLACEMENT Right 01/27/2016   Procedure: CYSTOSCOPY WITH STENT PLACEMENT;  Surgeon: [name], MD;  Location: [facility];  Service: Urology;  Laterality: Right;  . CYSTOSCOPY/RETROGRADE/URETEROSCOPY/STONE EXTRACTION WITH BASKET Right 01/27/2016   Procedure: CYSTOSCOPY, RIGHT RETROGRADE, RIGHT URETEROSCOPY, STONE EXTRACTION ;  Surgeon: [name], MD;  Location: [facility];  Service: Urology;  Laterality: Right;  . ESOPHAGOGASTRODUODENOSCOPY  10/15/2008     Dr Rourk:Schatzki's ring status post dilation and disruption via [address] dilator/ otherwise unremarkable esophagus, small hiatal hernia, multiple fundal gland polyps not manipulated, gastritis, negative H.pylori  . ESOPHAGOGASTRODUODENOSCOPY  06/21/02   [facility]:TMLYY sliding hiatal hernia with mild changes of reflux esophagitis limited to gastroesophageal junction.  Noncritical ring at distal esophagus, 3  cm proximal to gastroesophageal junction/Antral gastritis  . [place]   "broke face playing softball"  . FRACTURE SURGERY     "left wrist; broke it; took spur off"  . HOLMIUM LASER APPLICATION Right 12/28/1476   Procedure: HOLMIUM LASER APPLICATION;  Surgeon: [name], MD;  Location: [facility];  Service: Urology;  Laterality: Right;  . KNEE ARTHROSCOPY Right 05/18/2018   Procedure: PARTIAL MEDIAL MENISECTOMY AND SURGICAL LAVAGE AND CHONDROPLASTY;  Surgeon: [name], MD;  Location:  [facility];  Service: Orthopedics;  Laterality: Right;  . LEFT HEART CATHETERIZATION WITH CORONARY ANGIOGRAM N/A 12/27/2013   Procedure: LEFT HEART CATHETERIZATION WITH CORONARY ANGIOGRAM;  Surgeon: [name], MD;  Location: [facility];  Service: Cardiovascular;  Laterality: N/A;  . neck epidural    . POSTERIOR LUMBAR FUSION 4 LEVEL N/A 03/28/2018   Procedure: Thoracic eight -Lumbar two- FIXATION WITH SCREW PLACEMENT, DECOMPRESSION Thoracic ten-Thoracic eleven  FOR OSTEOMYELITIS;  Surgeon: Kristeen Miss, MD;  Location: [facility];  Service: Neurosurgery;  Laterality: N/A;  . SHOULDER SURGERY Bilateral   . TEE WITHOUT CARDIOVERSION N/A 03/21/2018   Procedure: TRANSESOPHAGEAL ECHOCARDIOGRAM (TEE) WITH PROPOFOL;  Surgeon: [name], MD;  [facility];  Service: Cardiovascular;  Laterality: N/A;    There were no vitals filed for this visit.  Subjective Assessment - 11/10/18 1135    Subjective  Patient stated that his head felt strange following second bout of ambulation.     Pertinent History  T10-11 Epidural Abscess, back surgery on 03/28/18, right knee partial medial menisectomy and chondroplasty    Limitations  Standing;Walking;House hold activities;Lifting    Diagnostic tests  CT Lumbar Spine 05/12/18: "Spinal stimulator device, along with multilevel multisegment fusion hardware appear appropriately placed without adverse features."    Patient Stated Goals  To walk    Currently in Pain?  No/denies                       [facility] Adult PT Treatment/Exercise - 11/10/18 0001      Ambulation/Gait   Ambulation/Gait  Yes    Ambulation/Gait Assistance  4: Min assist;3: Mod assist    Ambulation Distance (Feet)  80 Feet   50', 30'   Assistive device  Rolling walker    Gait Pattern  Decreased dorsiflexion - left;Decreased dorsiflexion - right;Trunk flexed;Decreased step length - right;Decreased step length - left;Step-through pattern      Exercises   Exercises  Knee/Hip       Knee/Hip Exercises: Stretches   Passive Hamstring Stretch  3 reps;30 seconds;Both    Passive Hamstring Stretch Limitations  seated with 8" box      Knee/Hip Exercises: Seated   Long Arc Quad  Both;2 sets;10 reps    Long Arc Quad Weight  2 lbs.    Heel Slides  Strengthening;Right;Left;2 sets;15 reps    Other Seated Knee/Hip Exercises  Seated marching over 6'' hurdle x 15 each LE, intermittent foot contact with hurdle    Other Seated Knee/Hip Exercises  --             PT Education - 11/10/18 1137    Education Details  Discussed purpose and technique of interventions and monitoring blood pressure at home.     Person(s) Educated  Patient    Methods  Explanation    Comprehension  Verbalized understanding       PT Short Term Goals - 11/03/18 0855      PT  SHORT TERM GOAL #1   Title  Patient will report understanding and regular compliance with HEP to improve strength and overall functional mobility.     Baseline  11/15: Patient stated he has not been doing his exercises at home as often as he should.     Time  4    Period  Weeks    Status  Partially Met      PT SHORT TERM GOAL #2   Title  Patient will demonstrate improvement of 1/2 MMT strength grade in all muscle groups tested to assist patient with safe transfers, ambulation, and standing.     Baseline  11/15: see MMT    Time  4    Period  Weeks    Status  Partially Met      PT SHORT TERM GOAL #3   Title  Patient will report or demonstrate ability to perform ambulation for 2 minutes with LRAD with no more than supervision assistance without rest break and reported no more than minimal difficulty.     Baseline  11/15: amb 55sec and then 1:18 this date with RW and min-mod A due to L knee buckle and BLE weakness; last reassessment was 2:30 straight wiht RW    Time  4    Period  Weeks    Status  On-going      PT SHORT TERM GOAL #4   Title  Patient will demonstrate ability to ambulate 60 feet without rest break using LRAD  with no more than minimal assistance.    Time  2    Period  Weeks    Status  Achieved        PT Long Term Goals - 11/03/18 2248      PT LONG TERM GOAL #1   Title  Patient will report or demonstrate ability to perform ambulation for 5 minutes with LRAD with no more than supervision assistance without rest break and reported no more than minimal difficulty.     Baseline  11/15: amb 55sec and then 1:18 this date with RW and min-mod A due to L knee buckle and BLE weakness; last reassessment was 2:30 straight wiht RW    Time  8    Period  Weeks    Status  On-going      PT LONG TERM GOAL #2   Title  Patient will report ability to stand at a support surface for 10 minutes without a rest break in order to perform household chores with increased ease.     Baseline  11/15: 3-59mns (maybe longer, not sure)    Time  8    Period  Weeks    Status  On-going      PT LONG TERM GOAL #3   Title  Patient will demonstrate ability to ambulate 120 feet without rest break using LRAD with no more than minimal assistance.    Baseline  11/15: amb 55sec (229f and then 1:18(4052fthis date with RW and min-mod A due to L knee buckle and BLE weakness; last reassessment was 2:30 straight wiht RW    Time  4    Period  Weeks    Status  On-going            Plan - 11/10/18 1251    Clinical Impression Statement  Patient presented to physical therapy feeling good today. After 2 bouts of ambulation patient stated he did not want to do anymore walking and that his head felt strange. Therapist took patient's blood  pressure and it was found to be 132/84. Remainder of session patient performed seated exercises monitoring patient's symptoms throughout. Patient stated that his head felt fine after a few minutes. Educated patient to continue monitoring his blood pressure and symptoms. Plan to resume more standing exercises next session as able.     Rehab Potential  Fair    PT Frequency  3x / week    PT Duration  6  weeks    PT Treatment/Interventions  ADLs/Self Care Home Management;Aquatic Therapy;DME Instruction;Gait training;Functional mobility training;Stair training;Therapeutic activities;Therapeutic exercise;Balance training;Neuromuscular re-education;Patient/family education;Orthotic Fit/Training;Wheelchair mobility training;Manual techniques;Passive range of motion;Energy conservation;Dry needling;Splinting;Taping    PT Next Visit Plan  Monitor for any symptoms and take BP as needed. Modify exercises as needed and resume standing exercises as able. gait training with RW and wc follow, hurdle step over in // bars and step ups.  Perform standing w/o UE support in // bars for endurance training and add cone taps and coordination drills/exercises (ladder). Progress Ai Chi and yoga poses in pool for flexibility and endurance training.    PT Home Exercise Plan  08/16/18:  seated DF/PF, LAQ, marching, abd with theraband and bridge; 11/13: HS and calf stretching (says he has been performing this at home)    Consulted and Agree with Plan of Care  Patient       Patient will benefit from skilled therapeutic intervention in order to improve the following deficits and impairments:  Abnormal gait, Decreased balance, Decreased endurance, Decreased mobility, Difficulty walking, Hypomobility, Decreased range of motion, Decreased activity tolerance, Decreased strength, Impaired flexibility  Visit Diagnosis: Other abnormalities of gait and mobility  Other symptoms and signs involving the musculoskeletal system  Muscle weakness (generalized)     Problem List Patient Active Problem List   Diagnosis Date Noted  . Pseudogout of knee   . Pyogenic arthritis of right knee joint ([place])   . Falls frequently 05/08/2018  . Fall   . Sleep disturbance   . Post-op pain   . Hypokalemia   . Morbid obesity ([place])   . Postoperative pain   . DVT, lower extremity, distal, acute, bilateral ([place])   . Acute blood loss anemia   .  Anemia of chronic disease   . Essential hypertension   . Bacteremia   . Myelopathy ([place]) 03/30/2018  . Paraplegia ([place])   . Postlaminectomy syndrome, lumbar region   . Epidural abscess   . Abdominal distension   . Encephalopathy   . Weakness of both lower extremities   . Discitis of thoracic region   . Spinal cord stimulator status   . Staphylococcus aureus sepsis ([place]) 03/20/2018  . Bacteremia due to methicillin susceptible Staphylococcus aureus (MSSA) 03/18/2018  . Obesity 03/18/2018  . Nephrolithiasis 03/16/2018  . AKI (acute kidney injury) ([place]) 03/16/2018  . Cognitive impairment 03/16/2018  . Chronic pain syndrome 03/16/2018  . B12 deficiency 01/28/2016  . Memory difficulty 07/23/2015  . History of cerebrovascular disease 07/23/2015  . FH: colon cancer 08/13/2014  . Hx of adenomatous colonic polyps 08/13/2014  . Chronic back pain 05/30/2012  . CAD, NATIVE VESSEL 07/17/2010  . HYPERCHOLESTEROLEMIA 10/31/2009  . SMOKELESS TOBACCO ABUSE 10/31/2009  . Obstructive sleep apnea 10/31/2009  . Essential hypertension, benign 10/31/2009  . GERD 10/31/2009   Clarene Critchley PT, DPT 12:54 PM, 11/10/18 [place], [place], [address] Phone: [phone]   Fax:  [phone]  Name: Cody Velez MRN: [id number] Date of  Birth: 1946-11-11

## 2018-11-13 ENCOUNTER — Other Ambulatory Visit: Payer: Self-pay

## 2018-11-13 ENCOUNTER — Ambulatory Visit (HOSPITAL_COMMUNITY): Payer: Medicare Other

## 2018-11-13 ENCOUNTER — Encounter (HOSPITAL_COMMUNITY): Payer: Self-pay

## 2018-11-13 DIAGNOSIS — M6281 Muscle weakness (generalized): Secondary | ICD-10-CM

## 2018-11-13 DIAGNOSIS — R2689 Other abnormalities of gait and mobility: Secondary | ICD-10-CM | POA: Diagnosis not present

## 2018-11-13 DIAGNOSIS — R29898 Other symptoms and signs involving the musculoskeletal system: Secondary | ICD-10-CM

## 2018-11-13 NOTE — Therapy (Signed)
[place], [place], [address] Phone: [phone]   Fax:  [phone]  Physical Therapy Aquatic Treatment  Patient Details  Name: Cody Velez MRN: [id number] Date of Birth: 1946-02-27 Referring Provider (PT): Asencion Noble, MD   Encounter Date: 11/13/2018  PT End of Session - 11/13/18 1536    Visit Number  34    Number of Visits  43    Date for PT Re-Evaluation  11/24/18   Minireassess completed 11/03/18   Authorization Type  Primary: Medicare; Secondary: BC[employer] (75 visits per calendar year with 14 used at evaluation)    Authorization Time Period  08/15/18 - 10/14/18; NEW: 10/13/18 to 11/24/18    Authorization - Visit Number  58   KX   Authorization - Number of Visits  75    PT Start Time  0762    PT Stop Time  1428    PT Time Calculation (min)  43 min    Equipment Utilized During Treatment  Gait belt    Activity Tolerance  Patient tolerated treatment well    Behavior During Therapy  WFL for tasks assessed/performed       Past Medical History:  Diagnosis Date  . Allergic rhinitis   . Arthritis   . B12 deficiency   . Cervicogenic headache 01/28/2016  . Childhood asthma   . Chronic back pain   . Coronary atherosclerosis of native coronary artery    Mild nonobstructive CAD at catheterization January 2015  . Depression   . Essential hypertension   . Falls   . GERD (gastroesophageal reflux disease)   . History of cerebrovascular disease 07/23/2015  . History of pneumonia 02/2011  . Hyperlipidemia   . Kidney stone   . Memory difficulty 07/23/2015  . OSA (obstructive sleep apnea)    CPAP - Dr. [name]  . Prostate cancer ([place])   . PTSD (post-traumatic stress disorder)    [place]  . Rectal bleeding     Past Surgical History:  Procedure Laterality Date  . APPLICATION OF ROBOTIC ASSISTANCE FOR SPINAL PROCEDURE N/A 03/28/2018   Procedure: APPLICATION OF ROBOTIC ASSISTANCE FOR SPINAL PROCEDURE;  Surgeon: Kristeen Miss, MD;  Location: [facility];  Service: Neurosurgery;  Laterality: N/A;  . BACK SURGERY  02/14/12   lumbar OR #7; "today redid L1L2; replaced screws; added bone from hip"  . BILATERAL KNEE ARTHROSCOPY    . COLONOSCOPY  10/15/2008   Dr. [name]: tubular adenoma   . COLONOSCOPY  12/17/2003   UQJ:[id number] rectal and colon  . COLONOSCOPY N/A 09/05/2014   Procedure: COLONOSCOPY;  Surgeon: Daneil Dolin, MD;  [facility];  Service: Endoscopy;  Laterality: N/A;  7:30-rescheduled 9/17 to [place] notified pt  . CYSTOSCOPY WITH STENT PLACEMENT Right 01/27/2016   Procedure: CYSTOSCOPY WITH STENT PLACEMENT;  Surgeon: [name], MD;  Location: [facility];  Service: Urology;  Laterality: Right;  . CYSTOSCOPY/RETROGRADE/URETEROSCOPY/STONE EXTRACTION WITH BASKET Right 01/27/2016   Procedure: CYSTOSCOPY, RIGHT RETROGRADE, RIGHT URETEROSCOPY, STONE EXTRACTION ;  Surgeon: [name], MD;  Location: [facility];  Service: Urology;  Laterality: Right;  . ESOPHAGOGASTRODUODENOSCOPY  10/15/2008     Dr Rourk:Schatzki's ring status post dilation and disruption via 27 F Maloney dilator/ otherwise unremarkable esophagus, small hiatal hernia, multiple fundal gland polyps not manipulated, gastritis, negative H.pylori  . ESOPHAGOGASTRODUODENOSCOPY  06/21/02   YBW:LSLHT sliding hiatal hernia with mild changes of reflux esophagitis limited to gastroesophageal junction.  Noncritical ring at distal esophagus,  3 cm proximal to gastroesophageal junction/Antral gastritis  . [place]   "broke face playing softball"  . FRACTURE SURGERY     "left wrist; broke it; took spur off"  . HOLMIUM LASER APPLICATION Right 3/0/0762   Procedure: HOLMIUM LASER APPLICATION;  Surgeon: [name], MD;  Location: [facility];  Service: Urology;  Laterality: Right;  . KNEE ARTHROSCOPY Right 05/18/2018   Procedure: PARTIAL MEDIAL MENISECTOMY AND SURGICAL LAVAGE AND CHONDROPLASTY;  Surgeon: [name], MD;   Location: [facility];  Service: Orthopedics;  Laterality: Right;  . LEFT HEART CATHETERIZATION WITH CORONARY ANGIOGRAM N/A 12/27/2013   Procedure: LEFT HEART CATHETERIZATION WITH CORONARY ANGIOGRAM;  Surgeon: [name], MD;  [facility];  Service: Cardiovascular;  Laterality: N/A;  . neck epidural    . POSTERIOR LUMBAR FUSION 4 LEVEL N/A 03/28/2018   Procedure: Thoracic eight -Lumbar two- FIXATION WITH SCREW PLACEMENT, DECOMPRESSION Thoracic ten-Thoracic eleven  FOR OSTEOMYELITIS;  Surgeon: Kristeen Miss, MD;  Location: [facility];  Service: Neurosurgery;  Laterality: N/A;  . SHOULDER SURGERY Bilateral   . TEE WITHOUT CARDIOVERSION N/A 03/21/2018   Procedure: TRANSESOPHAGEAL ECHOCARDIOGRAM (TEE) WITH PROPOFOL;  Surgeon: [name], MD;  [facility];  Service: Cardiovascular;  Laterality: N/A;    There were no vitals filed for this visit.  Subjective Assessment - 11/13/18 1528    Subjective  Patient reports he didn't do a lot this weekend and hasn't been out of his house since Friday. He states he has a headache and that is unusual for him.     Pertinent History  T10-11 Epidural Abscess, back surgery on 03/28/18, right knee partial medial menisectomy and chondroplasty    Limitations  Standing;Walking;House hold activities;Lifting    Diagnostic tests  CT Lumbar Spine 05/12/18: "Spinal stimulator device, along with multilevel multisegment fusion hardware appear appropriately placed without adverse features."    Patient Stated Goals  To walk    Currently in Pain?  Yes    Pain Score  2     Pain Location  Head    Pain Orientation  Upper    Pain Descriptors / Indicators  Aching    Pain Type  Acute pain    Pain Onset  Today    Pain Frequency  Rarely        Adult Aquatic Therapy - 11/13/18 1529      Aquatic Therapy Subjective   Subjective  Patient reports he didn't do a lot this weekend and hasn't been out of his house since Friday. He states he has a headache and that is  unusual for him.       Treatment   Gait  5x 20' forward gait with no UE support, 3x 20' retro gait with UE support on kickboard. Patient ambulated throughout session without UE assistance.    Exercises  Squat 1x 20 reps, with bil UE support at wall. Single limb heel raises, bil LE, with 3 sec holds, 1x20 reps. 2x 30 seconds hamstring stretch on platform, bil LE.     Specific Exercises  Hip/Low Back    Hip/Low Back  1x 10 reps bil LE: platform step up with contralateral UE support, step-up to stand (16-18" platform height). 3x stand (to failure) without UE support ~ 10-20 seconds. "saw" exercise, 10 reps bil with pool noodle.     Balance  Splash ball toss with Rt UE support on platform. 5 reps (toss & catch) with NBOS, 2x Rt tandem, 2x Lt  tandem.         PT Education - 11/13/18 1535    Education Details  Educated on exercises throughout and on importance of participating in [place].     Person(s) Educated  Patient    Methods  Explanation    Comprehension  Verbalized understanding;Returned demonstration       PT Short Term Goals - 11/03/18 0855      PT SHORT TERM GOAL #1   Title  Patient will report understanding and regular compliance with HEP to improve strength and overall functional mobility.     Baseline  11/15: Patient stated he has not been doing his exercises at home as often as he should.     Time  4    Period  Weeks    Status  Partially Met      PT SHORT TERM GOAL #2   Title  Patient will demonstrate improvement of 1/2 MMT strength grade in all muscle groups tested to assist patient with safe transfers, ambulation, and standing.     Baseline  11/15: see MMT    Time  4    Period  Weeks    Status  Partially Met      PT SHORT TERM GOAL #3   Title  Patient will report or demonstrate ability to perform ambulation for 2 minutes with LRAD with no more than supervision assistance without rest break and reported no more than minimal difficulty.     Baseline  11/15: amb 55sec and then  1:18 this date with RW and min-mod A due to L knee buckle and BLE weakness; last reassessment was 2:30 straight wiht RW    Time  4    Period  Weeks    Status  On-going      PT SHORT TERM GOAL #4   Title  Patient will demonstrate ability to ambulate 60 feet without rest break using LRAD with no more than minimal assistance.    Time  2    Period  Weeks    Status  Achieved        PT Long Term Goals - 11/03/18 3875      PT LONG TERM GOAL #1   Title  Patient will report or demonstrate ability to perform ambulation for 5 minutes with LRAD with no more than supervision assistance without rest break and reported no more than minimal difficulty.     Baseline  11/15: amb 55sec and then 1:18 this date with RW and min-mod A due to L knee buckle and BLE weakness; last reassessment was 2:30 straight wiht RW    Time  8    Period  Weeks    Status  On-going      PT LONG TERM GOAL #2   Title  Patient will report ability to stand at a support surface for 10 minutes without a rest break in order to perform household chores with increased ease.     Baseline  11/15: 3-72mns (maybe longer, not sure)    Time  8    Period  Weeks    Status  On-going      PT LONG TERM GOAL #3   Title  Patient will demonstrate ability to ambulate 120 feet without rest break using LRAD with no more than minimal assistance.    Baseline  11/15: amb 55sec (261f and then 1:18(40107fthis date with RW and min-mod A due to L knee buckle and BLE weakness; last reassessment was 2:30 straight wiht RW  Time  4    Period  Weeks    Status  On-going        Plan - 11/13/18 1537    Clinical Impression Statement  Patient arrived for aquatic therapy session and reported a headache but denied other pain. Start of session focused on gait training primarily in pool with forward/retro gait, patient required cues for heel to toe pattern with ambulation but has improved endurance with walking and decreased scissoring step pattern. He also  continued with step-ups today and reduced UE support and demonstrated improved endurance today with decreased buckling and fewer rest breaks between step-ups. He will continue to benefit from skilled PT interventions to address impairments in aquatic and land-based setting to improve QOL and independence at home.    Rehab Potential  Fair    PT Frequency  3x / week    PT Duration  6 weeks    PT Treatment/Interventions  ADLs/Self Care Home Management;Aquatic Therapy;DME Instruction;Gait training;Functional mobility training;Stair training;Therapeutic activities;Therapeutic exercise;Balance training;Neuromuscular re-education;Patient/family education;Orthotic Fit/Training;Wheelchair mobility training;Manual techniques;Passive range of motion;Energy conservation;Dry needling;Splinting;Taping    PT Next Visit Plan  Monitor for any symptoms and take BP as needed. Modify exercises as needed and resume standing exercises as able. gait training with RW and wc follow, hurdle step over in // bars and step ups.  Perform standing w/o UE support in // bars for endurance training and add cone taps and coordination drills/exercises (ladder). Progress step up training as able in // bars.    PT Home Exercise Plan  08/16/18:  seated DF/PF, LAQ, marching, abd with theraband and bridge; 11/13: HS and calf stretching (says he has been performing this at home)    Consulted and Agree with Plan of Care  Patient       Patient will benefit from skilled therapeutic intervention in order to improve the following deficits and impairments:  Abnormal gait, Decreased balance, Decreased endurance, Decreased mobility, Difficulty walking, Hypomobility, Decreased range of motion, Decreased activity tolerance, Decreased strength, Impaired flexibility  Visit Diagnosis: Other abnormalities of gait and mobility  Other symptoms and signs involving the musculoskeletal system  Muscle weakness (generalized)     Problem List Patient  Active Problem List   Diagnosis Date Noted  . Pseudogout of knee   . Pyogenic arthritis of right knee joint ([place])   . Falls frequently 05/08/2018  . Fall   . Sleep disturbance   . Post-op pain   . Hypokalemia   . Morbid obesity ([place])   . Postoperative pain   . DVT, lower extremity, distal, acute, bilateral ([place])   . Acute blood loss anemia   . Anemia of chronic disease   . Essential hypertension   . Bacteremia   . Myelopathy ([place]) 03/30/2018  . Paraplegia ([place])   . Postlaminectomy syndrome, lumbar region   . Epidural abscess   . Abdominal distension   . Encephalopathy   . Weakness of both lower extremities   . Discitis of thoracic region   . Spinal cord stimulator status   . Staphylococcus aureus sepsis ([place]) 03/20/2018  . Bacteremia due to methicillin susceptible Staphylococcus aureus (MSSA) 03/18/2018  . Obesity 03/18/2018  . Nephrolithiasis 03/16/2018  . AKI (acute kidney injury) ([place]) 03/16/2018  . Cognitive impairment 03/16/2018  . Chronic pain syndrome 03/16/2018  . B12 deficiency 01/28/2016  . Memory difficulty 07/23/2015  . History of cerebrovascular disease 07/23/2015  . FH: colon cancer 08/13/2014  . Hx of adenomatous colonic polyps 08/13/2014  . Chronic back pain  05/30/2012  . CAD, NATIVE VESSEL 07/17/2010  . HYPERCHOLESTEROLEMIA 10/31/2009  . SMOKELESS TOBACCO ABUSE 10/31/2009  . Obstructive sleep apnea 10/31/2009  . Essential hypertension, benign 10/31/2009  . GERD 10/31/2009    Kipp Brood, PT, DPT Physical Therapist with [facility]  11/13/2018 3:50 PM    [facility] [place] [address] [place], [place], [address] Phone: [phone]   Fax:  [phone]  Name: Cody Velez MRN: [id number] Date of Birth: 1946-09-16

## 2018-11-14 DIAGNOSIS — C61 Malignant neoplasm of prostate: Secondary | ICD-10-CM | POA: Diagnosis not present

## 2018-11-14 DIAGNOSIS — N3000 Acute cystitis without hematuria: Secondary | ICD-10-CM | POA: Diagnosis not present

## 2018-11-14 DIAGNOSIS — M4644 Discitis, unspecified, thoracic region: Secondary | ICD-10-CM | POA: Diagnosis not present

## 2018-11-14 DIAGNOSIS — G4733 Obstructive sleep apnea (adult) (pediatric): Secondary | ICD-10-CM | POA: Diagnosis not present

## 2018-11-14 DIAGNOSIS — I10 Essential (primary) hypertension: Secondary | ICD-10-CM | POA: Diagnosis not present

## 2018-11-22 ENCOUNTER — Encounter (HOSPITAL_COMMUNITY): Payer: Self-pay

## 2018-11-22 ENCOUNTER — Ambulatory Visit (HOSPITAL_COMMUNITY): Payer: Medicare Other | Attending: Internal Medicine

## 2018-11-22 ENCOUNTER — Other Ambulatory Visit: Payer: Self-pay

## 2018-11-22 DIAGNOSIS — M6281 Muscle weakness (generalized): Secondary | ICD-10-CM | POA: Insufficient documentation

## 2018-11-22 DIAGNOSIS — R2689 Other abnormalities of gait and mobility: Secondary | ICD-10-CM | POA: Diagnosis not present

## 2018-11-22 DIAGNOSIS — R29898 Other symptoms and signs involving the musculoskeletal system: Secondary | ICD-10-CM | POA: Diagnosis not present

## 2018-11-22 NOTE — Therapy (Signed)
[place], [place], [address] Phone: [phone]   Fax:  [phone]  Physical Therapy Treatment/Progress Note  Patient Details  Name: Cody Velez MRN: [id number] Date of Birth: 11/21/1946 Referring Provider (PT): Asencion Noble, MD   Encounter Date: 11/22/2018   Progress Note Reporting Period 11/03/18 to 11/22/18  See note below for Objective Data and Assessment of Progress/Goals.     PT End of Session - 11/22/18 1117    Visit Number  35    Number of Visits  43    Date for PT Re-Evaluation  11/24/18   Minireassess completed 11/03/18   Authorization Type  Primary: Medicare; Secondary: BCBS (75 visits per calendar year with 14 used at evaluation)    Authorization Time Period  08/15/18 - 10/14/18; NEW: 10/13/18 to 11/24/18    Authorization - Visit Number  46   KX   Authorization - Number of Visits  52    PT Start Time  1034    PT Stop Time  1114    PT Time Calculation (min)  40 min    Equipment Utilized During Treatment  Gait belt    Activity Tolerance  Patient tolerated treatment well    Behavior During Therapy  WFL for tasks assessed/performed       Past Medical History:  Diagnosis Date  . Allergic rhinitis   . Arthritis   . B12 deficiency   . Cervicogenic headache 01/28/2016  . Childhood asthma   . Chronic back pain   . Coronary atherosclerosis of native coronary artery    Mild nonobstructive CAD at catheterization January 2015  . Depression   . Essential hypertension   . Falls   . GERD (gastroesophageal reflux disease)   . History of cerebrovascular disease 07/23/2015  . History of pneumonia 02/2011  . Hyperlipidemia   . Kidney stone   . Memory difficulty 07/23/2015  . OSA (obstructive sleep apnea)    CPAP - Dr. [name]  . Prostate cancer ([place])   . PTSD (post-traumatic stress disorder)    [place]  . Rectal bleeding     Past Surgical History:  Procedure Laterality Date  . APPLICATION OF  ROBOTIC ASSISTANCE FOR SPINAL PROCEDURE N/A 03/28/2018   Procedure: APPLICATION OF ROBOTIC ASSISTANCE FOR SPINAL PROCEDURE;  Surgeon: Kristeen Miss, MD;  Location: [place];  Service: Neurosurgery;  Laterality: N/A;  . BACK SURGERY  02/14/12   lumbar OR #7; "today redid L1L2; replaced screws; added bone from hip"  . BILATERAL KNEE ARTHROSCOPY    . COLONOSCOPY  10/15/2008   Dr. [name]: tubular adenoma   . COLONOSCOPY  12/17/2003   EKB:TCYELY rectal and colon  . COLONOSCOPY N/A 09/05/2014   Procedure: COLONOSCOPY;  Surgeon: Daneil Dolin, MD;  [facility];  Service: Endoscopy;  Laterality: N/A;  7:30-rescheduled 9/17 to [place] notified pt  . CYSTOSCOPY WITH STENT PLACEMENT Right 01/27/2016   Procedure: CYSTOSCOPY WITH STENT PLACEMENT;  Surgeon: [name], MD;  Location: [facility];  Service: Urology;  Laterality: Right;  . CYSTOSCOPY/RETROGRADE/URETEROSCOPY/STONE EXTRACTION WITH BASKET Right 01/27/2016   Procedure: CYSTOSCOPY, RIGHT RETROGRADE, RIGHT URETEROSCOPY, STONE EXTRACTION ;  Surgeon: [name], MD;  Location: [facility];  Service: Urology;  Laterality: Right;  . ESOPHAGOGASTRODUODENOSCOPY  10/15/2008     Dr Rourk:Schatzki's ring status post dilation and disruption via [address] F Maloney dilator/ otherwise unremarkable esophagus, small hiatal hernia, multiple fundal gland polyps not manipulated, gastritis, negative H.pylori  . ESOPHAGOGASTRODUODENOSCOPY  06/21/02   QQV:ZDGLO sliding hiatal hernia with mild changes of reflux esophagitis limited to gastroesophageal junction.  Noncritical ring at distal esophagus, 3 cm proximal to gastroesophageal junction/Antral gastritis  . [place]   "broke face playing softball"  . FRACTURE SURGERY     "left wrist; broke it; took spur off"  . HOLMIUM LASER APPLICATION Right 06/23/6432   Procedure: HOLMIUM LASER APPLICATION;  Surgeon: [name], MD;  Location: [facility];  Service: Urology;  Laterality: Right;  .  KNEE ARTHROSCOPY Right 05/18/2018   Procedure: PARTIAL MEDIAL MENISECTOMY AND SURGICAL LAVAGE AND CHONDROPLASTY;  Surgeon: [name], MD;  Location: [facility];  Service: Orthopedics;  Laterality: Right;  . LEFT HEART CATHETERIZATION WITH CORONARY ANGIOGRAM N/A 12/27/2013   Procedure: LEFT HEART CATHETERIZATION WITH CORONARY ANGIOGRAM;  Surgeon: [name], MD;  Location: [facility];  Service: Cardiovascular;  Laterality: N/A;  . neck epidural    . POSTERIOR LUMBAR FUSION 4 LEVEL N/A 03/28/2018   Procedure: Thoracic eight -Lumbar two- FIXATION WITH SCREW PLACEMENT, DECOMPRESSION Thoracic ten-Thoracic eleven  FOR OSTEOMYELITIS;  Surgeon: Kristeen Miss, MD;  Location: [place];  Service: Neurosurgery;  Laterality: N/A;  . SHOULDER SURGERY Bilateral   . TEE WITHOUT CARDIOVERSION N/A 03/21/2018   Procedure: TRANSESOPHAGEAL ECHOCARDIOGRAM (TEE) WITH PROPOFOL;  Surgeon: [name], MD;  [facility];  Service: Cardiovascular;  Laterality: N/A;    There were no vitals filed for this visit.  Subjective Assessment - 11/22/18 1324    Subjective  Patient reports he had a nice Thanksgiving today and that he missed being able to get in the pool this week. He states he wishes he could do that more often.    Pertinent History  T10-11 Epidural Abscess, back surgery on 03/28/18, right knee partial medial menisectomy and chondroplasty    Limitations  Standing;Walking;House hold activities;Lifting    Diagnostic tests  CT Lumbar Spine 05/12/18: "Spinal stimulator device, along with multilevel multisegment fusion hardware appear appropriately placed without adverse features."    Patient Stated Goals  To walk    Currently in Pain?  No/denies         [facility] PT Assessment - 11/22/18 0001      Assessment   Medical Diagnosis  T10-11 Epidural Abscess "Paraplegia"    Referring Provider (PT)  Asencion Noble, MD    Onset Date/Surgical Date  03/28/18   back surgery   Next MD Visit  --    Prior Therapy  Yes,  inpatient rehab and home health which patient reported ended 08/04/18      [place] residence    Living Arrangements  Spouse/significant other    Type of [place] entrance    [place] to live on main level with bedroom/bathroom    [place] - 2 wheels;Walker - 4 wheels;Wheelchair - manual      Prior Function   Level of Independence  Independent;Independent with basic ADLs    Vocation  Retired      Charity fundraiser Status  Within Functional Limits for tasks assessed      Observation/Other Assessments   Focus on Therapeutic Outcomes (FOTO)   --   54% limited     Strength   Right Hip Flexion  4/5   was 4   Right Hip Extension  3-/5   was 2+   Right  Hip ABduction  3-/5   was 3   Left Hip Flexion  4+/5   was 4   Left Hip Extension  3-/5   was 2+   Left Hip ABduction  3-/5   was 3-   Right Knee Flexion  4+/5   was 4+   Right Knee Extension  5/5   was 5   Left Knee Flexion  4+/5   was 4+   Left Knee Extension  4+/5   was 4+   Right Ankle Dorsiflexion  5/5   was 4+   Left Ankle Dorsiflexion  4+/5   was 4+     Transfers   Five time sit to stand comments   29.54 with UE assist and RW on 1st attempt; 19.95 on second attempt      Ambulation/Gait   Ambulation/Gait  Yes    Ambulation/Gait Assistance  4: Min guard   w/c follow   Ambulation Distance (Feet)  90 Feet   2MWT   Assistive device  Rolling walker    Gait Pattern  Decreased dorsiflexion - left;Decreased dorsiflexion - right;Trunk flexed;Decreased step length - right;Decreased step length - left;Step-through pattern    Gait velocity  0.22 m/s    Gait Comments  cues to encourage quad activation for knee extension during stance phase        [facility] Adult PT Treatment/Exercise - 11/22/18 0001      Knee/Hip Exercises: Aerobic   Nustep  10 mins, Level 4, LE only, for reciprocal gait training and mm strength and  endurance (at EOS not billed)      Knee/Hip Exercises: Standing   Forward Step Up  Both;5 reps;Hand Hold: 2;Step Height: 4";3 sets   pt Lt knee buckled during 1st set       PT Education - 11/22/18 1335    Education Details  Educated on plan to continue with therapy and on importance of stretching for HEP. Educated patient that his LE strength will be limited by muscle length limitations and this is one reason it is important to stretch daily for HEP.    Person(s) Educated  Patient    Methods  Explanation    Comprehension  Verbalized understanding       PT Short Term Goals - 11/22/18 1117      PT SHORT TERM GOAL #1   Title  Patient will report understanding and regular compliance with HEP to improve strength and overall functional mobility.     Baseline  11/15: Patient stated he has not been doing his exercises at home as often as he should.     Time  4    Period  Weeks    Status  Partially Met      PT SHORT TERM GOAL #2   Title  Patient will demonstrate improvement of 1/2 MMT strength grade in all muscle groups tested to assist patient with safe transfers, ambulation, and standing.     Baseline  11/15: see MMT    Time  4    Period  Weeks    Status  Partially Met      PT SHORT TERM GOAL #3   Title  Patient will report or demonstrate ability to perform ambulation for 2 minutes with LRAD with no more than supervision assistance without rest break and reported no more than minimal difficulty.     Baseline  11/15: amb 55sec and then 1:18 this date with RW and min-mod A due to L knee buckle and BLE weakness;  last reassessment was 2:30 straight wiht RW    Time  4    Period  Weeks    Status  Achieved      PT SHORT TERM GOAL #4   Title  Patient will demonstrate ability to ambulate 60 feet without rest break using LRAD with no more than minimal assistance.    Time  2    Period  Weeks    Status  Achieved        PT Long Term Goals - 11/22/18 1118      PT LONG TERM GOAL #1    Title  Patient will report or demonstrate ability to perform ambulation for 5 minutes with LRAD with no more than supervision assistance without rest break and reported no more than minimal difficulty.     Baseline  amb 2:45 straight with min guard and wc follow    Time  6    Period  Weeks    Status  On-going    Target Date  01/03/19      PT LONG TERM GOAL #2   Title  Patient will report ability to stand at a support surface for 10 minutes without a rest break in order to perform household chores with increased ease.     Baseline  4-5 minutes    Time  6    Period  Weeks    Status  On-going      PT LONG TERM GOAL #3   Title  Patient will demonstrate ability to ambulate 120 feet without rest break using LRAD with no more than minimal assistance.    Baseline  90 feet    Time  6    Period  Weeks    Status  On-going      PT LONG TERM GOAL #4   Title  Patient will perform 5x sit to stand from standard chair with good control and single UE assist in 16 seconds or less to demonstrate improved LE strength for functional transfers.    Time  6    Period  Weeks    Status  New        Plan - 11/22/18 1117    Clinical Impression Statement  Re-assessment performed today. Patient is making steady progress towards goals. He ambulated with min guard assist and wheelchair follow for safety today using RW to complete 2 MWT. He was able to ambulate 90' in 2 minutes and continued for another 45 seconds before requiring a seated rest break. Patient continues to demonstrate scissoring gait with fatigue and requires cues to maintain upright posture. He complained more of Bil LE paresthesia and he had greater difficulty maintaining knee extension in stance this date. His hip extensor strength has improve further but he is limited with knee extension and hip abduction strength due to muscle length deficits. He has become modified independent for all transfers and is making steady progress towards goals. Updated  goals to include functional 5x sit to stand testing. He will continue to benefit from skilled PT interventions to address impairments and continue to progress independence with functional gait and mobility.    Rehab Potential  Fair    PT Frequency  3x / week    PT Duration  6 weeks    PT Treatment/Interventions  ADLs/Self Care Home Management;Aquatic Therapy;DME Instruction;Gait training;Functional mobility training;Stair training;Therapeutic activities;Therapeutic exercise;Balance training;Neuromuscular re-education;Patient/family education;Orthotic Fit/Training;Wheelchair mobility training;Manual techniques;Passive range of motion;Energy conservation;Dry needling;Splinting;Taping    PT Next Visit Plan  Monitor for any symptoms and  take BP as needed. Add hip adductor stretch and hamstring stretch. Review importance of this for functional gait/mobility. Modify exercises as needed and resume standing exercises as able. gait training with RW and wc follow, hurdle step over in // bars and step ups.  Perform standing w/o UE support in // bars for endurance training and add cone taps and coordination drills/exercises (ladder). Progress step up training as able in // bars.    PT Home Exercise Plan  08/16/18:  seated DF/PF, LAQ, marching, abd with theraband and bridge; 11/13: HS and calf stretching (says he has been performing this at home)    Consulted and Agree with Plan of Care  Patient       Patient will benefit from skilled therapeutic intervention in order to improve the following deficits and impairments:  Abnormal gait, Decreased balance, Decreased endurance, Decreased mobility, Difficulty walking, Hypomobility, Decreased range of motion, Decreased activity tolerance, Decreased strength, Impaired flexibility  Visit Diagnosis: Other abnormalities of gait and mobility  Other symptoms and signs involving the musculoskeletal system  Muscle weakness (generalized)     Problem List Patient Active  Problem List   Diagnosis Date Noted  . Pseudogout of knee   . Pyogenic arthritis of right knee joint ([place])   . Falls frequently 05/08/2018  . Fall   . Sleep disturbance   . Post-op pain   . Hypokalemia   . Morbid obesity ([place])   . Postoperative pain   . DVT, lower extremity, distal, acute, bilateral ([place])   . Acute blood loss anemia   . Anemia of chronic disease   . Essential hypertension   . Bacteremia   . Myelopathy ([place]) 03/30/2018  . Paraplegia ([place])   . Postlaminectomy syndrome, lumbar region   . Epidural abscess   . Abdominal distension   . Encephalopathy   . Weakness of both lower extremities   . Discitis of thoracic region   . Spinal cord stimulator status   . Staphylococcus aureus sepsis ([place]) 03/20/2018  . Bacteremia due to methicillin susceptible Staphylococcus aureus (MSSA) 03/18/2018  . Obesity 03/18/2018  . Nephrolithiasis 03/16/2018  . AKI (acute kidney injury) ([place]) 03/16/2018  . Cognitive impairment 03/16/2018  . Chronic pain syndrome 03/16/2018  . B12 deficiency 01/28/2016  . Memory difficulty 07/23/2015  . History of cerebrovascular disease 07/23/2015  . FH: colon cancer 08/13/2014  . Hx of adenomatous colonic polyps 08/13/2014  . Chronic back pain 05/30/2012  . CAD, NATIVE VESSEL 07/17/2010  . HYPERCHOLESTEROLEMIA 10/31/2009  . SMOKELESS TOBACCO ABUSE 10/31/2009  . Obstructive sleep apnea 10/31/2009  . Essential hypertension, benign 10/31/2009  . GERD 10/31/2009    Kipp Brood, PT, DPT Physical Therapist with [facility]  11/22/2018 1:44 PM    [facility] [address] [place], [place], [address] Phone: [phone]   Fax:  [phone]  Name: Cody Velez MRN: [id number] Date of Birth: [birth year]

## 2018-11-24 ENCOUNTER — Ambulatory Visit (HOSPITAL_COMMUNITY): Payer: Medicare Other

## 2018-11-24 ENCOUNTER — Encounter (HOSPITAL_COMMUNITY): Payer: Self-pay

## 2018-11-24 DIAGNOSIS — R2689 Other abnormalities of gait and mobility: Secondary | ICD-10-CM

## 2018-11-24 DIAGNOSIS — M6281 Muscle weakness (generalized): Secondary | ICD-10-CM

## 2018-11-24 DIAGNOSIS — R29898 Other symptoms and signs involving the musculoskeletal system: Secondary | ICD-10-CM

## 2018-11-24 NOTE — Therapy (Signed)
[place], [place], [address] Phone: [phone]   Fax:  [phone]  Physical Therapy Treatment  Patient Details  Name: Cody Velez MRN: [id number] Date of Birth: Dec 22, 1945 Referring Provider (PT): Asencion Noble, MD   Encounter Date: 11/24/2018  PT End of Session - 11/24/18 1117    Visit Number  36    Number of Visits  58    Date for PT Re-Evaluation  11/24/18   Minireassess completed 11/03/18   Authorization Type  Primary: Medicare; Secondary: [employer] (75 visits per calendar year with 14 used at evaluation)    Authorization Time Period  08/15/18 - 10/14/18; NEW: 10/13/18 to 11/24/18; 11/22/18 - 01/03/18    Authorization - Visit Number  39   KX   Authorization - Number of Visits  75    PT Start Time  7209    PT Stop Time  1158    PT Time Calculation (min)  40 min    Equipment Utilized During Treatment  Gait belt    Activity Tolerance  Patient tolerated treatment well    Behavior During Therapy  WFL for tasks assessed/performed       Past Medical History:  Diagnosis Date  . Allergic rhinitis   . Arthritis   . B12 deficiency   . Cervicogenic headache 01/28/2016  . Childhood asthma   . Chronic back pain   . Coronary atherosclerosis of native coronary artery    Mild nonobstructive CAD at catheterization January 2015  . Depression   . Essential hypertension   . Falls   . GERD (gastroesophageal reflux disease)   . History of cerebrovascular disease 07/23/2015  . History of pneumonia 02/2011  . Hyperlipidemia   . Kidney stone   . Memory difficulty 07/23/2015  . OSA (obstructive sleep apnea)    CPAP - Dr. [name]  . Prostate cancer ([place])   . PTSD (post-traumatic stress disorder)    [place]  . Rectal bleeding     Past Surgical History:  Procedure Laterality Date  . APPLICATION OF ROBOTIC ASSISTANCE FOR SPINAL PROCEDURE N/A 03/28/2018   Procedure: APPLICATION OF ROBOTIC ASSISTANCE FOR SPINAL PROCEDURE;  Surgeon:  Kristeen Miss, MD;  Location: [facility];  Service: Neurosurgery;  Laterality: N/A;  . BACK SURGERY  02/14/12   lumbar OR #7; "today redid L1L2; replaced screws; added bone from hip"  . BILATERAL KNEE ARTHROSCOPY    . COLONOSCOPY  10/15/2008   Dr. [name]: tubular adenoma   . COLONOSCOPY  12/17/2003   OBS:JGGEZM rectal and colon  . COLONOSCOPY N/A 09/05/2014   Procedure: COLONOSCOPY;  Surgeon: Daneil Dolin, MD;  [facility];  Service: Endoscopy;  Laterality: N/A;  7:30-rescheduled 9/17 to [place] notified pt  . CYSTOSCOPY WITH STENT PLACEMENT Right 01/27/2016   Procedure: CYSTOSCOPY WITH STENT PLACEMENT;  Surgeon: [name], MD;  Location: [facility];  Service: Urology;  Laterality: Right;  . CYSTOSCOPY/RETROGRADE/URETEROSCOPY/STONE EXTRACTION WITH BASKET Right 01/27/2016   Procedure: CYSTOSCOPY, RIGHT RETROGRADE, RIGHT URETEROSCOPY, STONE EXTRACTION ;  Surgeon: [name], MD;  Location: [facility];  Service: Urology;  Laterality: Right;  . ESOPHAGOGASTRODUODENOSCOPY  10/15/2008     Dr Rourk:Schatzki's ring status post dilation and disruption via 36 F Maloney dilator/ otherwise unremarkable esophagus, small hiatal hernia, multiple fundal gland polyps not manipulated, gastritis, negative H.pylori  . ESOPHAGOGASTRODUODENOSCOPY  06/21/02   OQ[facility]:UTMLY sliding hiatal hernia with mild changes of reflux esophagitis limited to gastroesophageal junction.  Noncritical ring at  distal esophagus, 3 cm proximal to gastroesophageal junction/Antral gastritis  . [place]   "broke face playing softball"  . FRACTURE SURGERY     "left wrist; broke it; took spur off"  . HOLMIUM LASER APPLICATION Right 08/24/8015   Procedure: HOLMIUM LASER APPLICATION;  Surgeon: [name], MD;  Location: [facility];  Service: Urology;  Laterality: Right;  . KNEE ARTHROSCOPY Right 05/18/2018   Procedure: PARTIAL MEDIAL MENISECTOMY AND SURGICAL LAVAGE AND CHONDROPLASTY;  Surgeon: [name], MD;  Location: [facility];  Service: Orthopedics;  Laterality: Right;  . LEFT HEART CATHETERIZATION WITH CORONARY ANGIOGRAM N/A 12/27/2013   Procedure: LEFT HEART CATHETERIZATION WITH CORONARY ANGIOGRAM;  Surgeon: [name], MD;  Location: [facility] CATH [facility];  Service: Cardiovascular;  Laterality: N/A;  . neck epidural    . POSTERIOR LUMBAR FUSION 4 LEVEL N/A 03/28/2018   Procedure: Thoracic eight -Lumbar two- FIXATION WITH SCREW PLACEMENT, DECOMPRESSION Thoracic ten-Thoracic eleven  FOR OSTEOMYELITIS;  Surgeon: Kristeen Miss, MD;  Location: [facility];  Service: Neurosurgery;  Laterality: N/A;  . SHOULDER SURGERY Bilateral   . TEE WITHOUT CARDIOVERSION N/A 03/21/2018   Procedure: TRANSESOPHAGEAL ECHOCARDIOGRAM (TEE) WITH PROPOFOL;  Surgeon: [name], MD;  [facility];  Service: Cardiovascular;  Laterality: N/A;    There were no vitals filed for this visit.  Subjective Assessment - 11/24/18 1117    Subjective  Pt states that he has a headache today but otherwise he's good.     Pertinent History  T10-11 Epidural Abscess, back surgery on 03/28/18, right knee partial medial menisectomy and chondroplasty    Limitations  Standing;Walking;House hold activities;Lifting    Diagnostic tests  CT Lumbar Spine 05/12/18: "Spinal stimulator device, along with multilevel multisegment fusion hardware appear appropriately placed without adverse features."    Patient Stated Goals  To walk    Currently in Pain?  No/denies            [facility] Adult PT Treatment/Exercise - 11/24/18 0001      Ambulation/Gait   Ambulation/Gait  Yes    Ambulation/Gait Assistance  4: Min guard   w/c follow   Ambulation Distance (Feet)  226 Feet   151f, 115f   Assistive device  Rolling walker    Gait Pattern  Decreased dorsiflexion - left;Decreased dorsiflexion - right;Trunk flexed;Decreased step length - right;Decreased step length - left;Step-through pattern    Gait Comments  cues to reduce scissoring gait and  reduce UE pressure on RW      Exercises   Exercises  Knee/Hip      Knee/Hip Exercises: Stretches   Passive Hamstring Stretch  Both;5 reps;20 seconds    Passive Hamstring Stretch Limitations  supine, 90/90 position, manual stretch from PT    Other Knee/Hip Stretches  supine hip add stretch manual from PT 3x20" each      Knee/Hip Exercises: Seated   Other Seated Knee/Hip Exercises  seated cone taps (x4 cones) BLE x5RT      Knee/Hip Exercises: Supine   Bridges  Both;15 reps    Bridges Limitations  cues for form    Straight Leg Raises  Both;15 reps    Straight Leg Raises Limitations  cues for control            PT Education - 11/24/18 1206    Education Details  exercise technique, perform HEP    Person(s) Educated  Patient    Methods  Explanation    Comprehension  Verbalized understanding  PT Short Term Goals - 11/22/18 1117      PT SHORT TERM GOAL #1   Title  Patient will report understanding and regular compliance with HEP to improve strength and overall functional mobility.     Baseline  11/15: Patient stated he has not been doing his exercises at home as often as he should.     Time  4    Period  Weeks    Status  Partially Met      PT SHORT TERM GOAL #2   Title  Patient will demonstrate improvement of 1/2 MMT strength grade in all muscle groups tested to assist patient with safe transfers, ambulation, and standing.     Baseline  11/15: see MMT    Time  4    Period  Weeks    Status  Partially Met      PT SHORT TERM GOAL #3   Title  Patient will report or demonstrate ability to perform ambulation for 2 minutes with LRAD with no more than supervision assistance without rest break and reported no more than minimal difficulty.     Baseline  11/15: amb 55sec and then 1:18 this date with RW and min-mod A due to L knee buckle and BLE weakness; last reassessment was 2:30 straight wiht RW    Time  4    Period  Weeks    Status  Achieved      PT SHORT TERM GOAL #4    Title  Patient will demonstrate ability to ambulate 60 feet without rest break using LRAD with no more than minimal assistance.    Time  2    Period  Weeks    Status  Achieved        PT Long Term Goals - 11/22/18 1118      PT LONG TERM GOAL #1   Title  Patient will report or demonstrate ability to perform ambulation for 5 minutes with LRAD with no more than supervision assistance without rest break and reported no more than minimal difficulty.     Baseline  amb 2:45 straight with min guard and wc follow    Time  6    Period  Weeks    Status  On-going    Target Date  01/03/19      PT LONG TERM GOAL #2   Title  Patient will report ability to stand at a support surface for 10 minutes without a rest break in order to perform household chores with increased ease.     Baseline  4-5 minutes    Time  6    Period  Weeks    Status  On-going      PT LONG TERM GOAL #3   Title  Patient will demonstrate ability to ambulate 120 feet without rest break using LRAD with no more than minimal assistance.    Baseline  90 feet    Time  6    Period  Weeks    Status  On-going      PT LONG TERM GOAL #4   Title  Patient will perform 5x sit to stand from standard chair with good control and single UE assist in 16 seconds or less to demonstrate improved LE strength for functional transfers.    Time  6    Period  Weeks    Status  New            Plan - 11/24/18 1207    Clinical Impression Statement  Began  session with manual stretching of pt's HS and hip add in order to reduce tone and maximize gait. Pt noted to be tighter in R HS compared to L. Pt with difficulty controlling LLE during SLR indicating continued proprioception and coordination deficits. Tolerated increased ambulation this date with no bouts of buckling, but as he fatigued, he had increased scissoring gait and unsteadiness. Seated cone taps performed to address proprioception and coordination deficits. Continue as planned, progressing  as able.    Rehab Potential  Fair    PT Frequency  3x / week    PT Duration  6 weeks    PT Treatment/Interventions  ADLs/Self Care Home Management;Aquatic Therapy;DME Instruction;Gait training;Functional mobility training;Stair training;Therapeutic activities;Therapeutic exercise;Balance training;Neuromuscular re-education;Patient/family education;Orthotic Fit/Training;Wheelchair mobility training;Manual techniques;Passive range of motion;Energy conservation;Dry needling;Splinting;Taping    PT Next Visit Plan  Monitor for any symptoms and take BP as needed. continue manual hip adductor stretch and hamstring stretch. Review importance of this for functional gait/mobility. Modify exercises as needed and resume standing exercises as able. gait training with RW and wc follow, hurdle step over in // bars and step ups.  Perform standing w/o UE support in // bars for endurance training and add cone taps and coordination drills/exercises (ladder). Progress step up training as able in // bars.    PT Home Exercise Plan  08/16/18:  seated DF/PF, LAQ, marching, abd with theraband and bridge; 11/13: HS and calf stretching (says he has been performing this at home)    Consulted and Agree with Plan of Care  Patient       Patient will benefit from skilled therapeutic intervention in order to improve the following deficits and impairments:  Abnormal gait, Decreased balance, Decreased endurance, Decreased mobility, Difficulty walking, Hypomobility, Decreased range of motion, Decreased activity tolerance, Decreased strength, Impaired flexibility  Visit Diagnosis: Other abnormalities of gait and mobility  Other symptoms and signs involving the musculoskeletal system  Muscle weakness (generalized)     Problem List Patient Active Problem List   Diagnosis Date Noted  . Pseudogout of knee   . Pyogenic arthritis of right knee joint ([place])   . Falls frequently 05/08/2018  . Fall   . Sleep disturbance   . Post-op  pain   . Hypokalemia   . Morbid obesity ([place])   . Postoperative pain   . DVT, lower extremity, distal, acute, bilateral ([place])   . Acute blood loss anemia   . Anemia of chronic disease   . Essential hypertension   . Bacteremia   . Myelopathy ([place]) 03/30/2018  . Paraplegia ([place])   . Postlaminectomy syndrome, lumbar region   . Epidural abscess   . Abdominal distension   . Encephalopathy   . Weakness of both lower extremities   . Discitis of thoracic region   . Spinal cord stimulator status   . Staphylococcus aureus sepsis ([place]) 03/20/2018  . Bacteremia due to methicillin susceptible Staphylococcus aureus (MSSA) 03/18/2018  . Obesity 03/18/2018  . Nephrolithiasis 03/16/2018  . AKI (acute kidney injury) ([place]) 03/16/2018  . Cognitive impairment 03/16/2018  . Chronic pain syndrome 03/16/2018  . B12 deficiency 01/28/2016  . Memory difficulty 07/23/2015  . History of cerebrovascular disease 07/23/2015  . FH: colon cancer 08/13/2014  . Hx of adenomatous colonic polyps 08/13/2014  . Chronic back pain 05/30/2012  . CAD, NATIVE VESSEL 07/17/2010  . HYPERCHOLESTEROLEMIA 10/31/2009  . SMOKELESS TOBACCO ABUSE 10/31/2009  . Obstructive sleep apnea 10/31/2009  . Essential hypertension, benign 10/31/2009  . GERD 10/31/2009  Geraldine Solar PT, [place] [address] [place], [place], [address] Phone: [phone]   Fax:  [phone]  Name: Cody Velez MRN: [id number] Date of Birth: [birth year]

## 2018-11-27 ENCOUNTER — Other Ambulatory Visit: Payer: Self-pay

## 2018-11-27 ENCOUNTER — Encounter (HOSPITAL_COMMUNITY): Payer: Self-pay

## 2018-11-27 ENCOUNTER — Ambulatory Visit (HOSPITAL_COMMUNITY): Payer: Medicare Other

## 2018-11-27 DIAGNOSIS — R29898 Other symptoms and signs involving the musculoskeletal system: Secondary | ICD-10-CM | POA: Diagnosis not present

## 2018-11-27 DIAGNOSIS — M6281 Muscle weakness (generalized): Secondary | ICD-10-CM

## 2018-11-27 DIAGNOSIS — R2689 Other abnormalities of gait and mobility: Secondary | ICD-10-CM

## 2018-11-27 NOTE — Therapy (Signed)
[facility] [place], [place], [address] Phone: [phone]   Fax:  [phone]  Physical Therapy Aquatic Treatment  Patient Details  Name: Cody Velez MRN: [id number] Date of Birth: Mar 19, 1946 Referring Provider (PT): Asencion Noble, MD   Encounter Date: 11/27/2018  PT End of Session - 11/27/18 1637    Visit Number  37    Number of Visits  61    Date for PT Re-Evaluation  11/24/18   Minireassess completed 11/03/18   Authorization Type  Primary: Medicare; Secondary: [employer] (75 visits per calendar year with 14 used at evaluation)    Authorization Time Period  08/15/18 - 10/14/18; NEW: 10/13/18 to 11/24/18; 11/22/18 - 01/03/18    Authorization - Visit Number  31   KX   Authorization - Number of Visits  75    PT Start Time  3545    PT Stop Time  1430    PT Time Calculation (min)  41 min    Equipment Utilized During Treatment  Gait belt    Activity Tolerance  Patient tolerated treatment well    Behavior During Therapy  WFL for tasks assessed/performed       Past Medical History:  Diagnosis Date  . Allergic rhinitis   . Arthritis   . B12 deficiency   . Cervicogenic headache 01/28/2016  . Childhood asthma   . Chronic back pain   . Coronary atherosclerosis of native coronary artery    Mild nonobstructive CAD at catheterization January 2015  . Depression   . Essential hypertension   . Falls   . GERD (gastroesophageal reflux disease)   . History of cerebrovascular disease 07/23/2015  . History of pneumonia 02/2011  . Hyperlipidemia   . Kidney stone   . Memory difficulty 07/23/2015  . OSA (obstructive sleep apnea)    CPAP - Dr. [name]  . Prostate cancer ([place])   . PTSD (post-traumatic stress disorder)    [place]  . Rectal bleeding     Past Surgical History:  Procedure Laterality Date  . APPLICATION OF ROBOTIC ASSISTANCE FOR SPINAL PROCEDURE N/A 03/28/2018   Procedure: APPLICATION OF ROBOTIC ASSISTANCE FOR SPINAL PROCEDURE;   Surgeon: Kristeen Miss, MD;  Location: [facility];  Service: Neurosurgery;  Laterality: N/A;  . BACK SURGERY  02/14/12   lumbar OR #7; "today redid L1L2; replaced screws; added bone from hip"  . BILATERAL KNEE ARTHROSCOPY    . COLONOSCOPY  10/15/2008   Dr. [name]: tubular adenoma   . COLONOSCOPY  12/17/2003   GYB:WLSLHT rectal and colon  . COLONOSCOPY N/A 09/05/2014   Procedure: COLONOSCOPY;  Surgeon: Daneil Dolin, MD;  [facility];  Service: Endoscopy;  Laterality: N/A;  7:30-rescheduled 9/17 to [place] notified pt  . CYSTOSCOPY WITH STENT PLACEMENT Right 01/27/2016   Procedure: CYSTOSCOPY WITH STENT PLACEMENT;  Surgeon: [name], MD;  Location: [facility];  Service: Urology;  Laterality: Right;  . CYSTOSCOPY/RETROGRADE/URETEROSCOPY/STONE EXTRACTION WITH BASKET Right 01/27/2016   Procedure: CYSTOSCOPY, RIGHT RETROGRADE, RIGHT URETEROSCOPY, STONE EXTRACTION ;  Surgeon: [name], MD;  Location: [facility];  Service: Urology;  Laterality: Right;  . ESOPHAGOGASTRODUODENOSCOPY  10/15/2008     Dr Rourk:Schatzki's ring status post dilation and disruption via 76 F Maloney dilator/ otherwise unremarkable esophagus, small hiatal hernia, multiple fundal gland polyps not manipulated, gastritis, negative H.pylori  . ESOPHAGOGASTRODUODENOSCOPY  06/21/02   DSK:AJGOT sliding hiatal hernia with mild changes of reflux esophagitis limited to gastroesophageal junction.  Noncritical ring  at distal esophagus, 3 cm proximal to gastroesophageal junction/Antral gastritis  . [place]   "broke face playing softball"  . FRACTURE SURGERY     "left wrist; broke it; took spur off"  . HOLMIUM LASER APPLICATION Right 06/24/4491   Procedure: HOLMIUM LASER APPLICATION;  Surgeon: [name], MD;  Location: [facility];  Service: Urology;  Laterality: Right;  . KNEE ARTHROSCOPY Right 05/18/2018   Procedure: PARTIAL MEDIAL MENISECTOMY AND SURGICAL LAVAGE AND CHONDROPLASTY;  Surgeon:  [name], MD;  Location: [facility];  Service: Orthopedics;  Laterality: Right;  . LEFT HEART CATHETERIZATION WITH CORONARY ANGIOGRAM N/A 12/27/2013   Procedure: LEFT HEART CATHETERIZATION WITH CORONARY ANGIOGRAM;  Surgeon: [name], MD;  Location: [facility] CATH [facility];  Service: Cardiovascular;  Laterality: N/A;  . neck epidural    . POSTERIOR LUMBAR FUSION 4 LEVEL N/A 03/28/2018   Procedure: Thoracic eight -Lumbar two- FIXATION WITH SCREW PLACEMENT, DECOMPRESSION Thoracic ten-Thoracic eleven  FOR OSTEOMYELITIS;  Surgeon: Kristeen Miss, MD;  Location: [facility];  Service: Neurosurgery;  Laterality: N/A;  . SHOULDER SURGERY Bilateral   . TEE WITHOUT CARDIOVERSION N/A 03/21/2018   Procedure: TRANSESOPHAGEAL ECHOCARDIOGRAM (TEE) WITH PROPOFOL;  Surgeon: [name], MD;  [facility];  Service: Cardiovascular;  Laterality: N/A;    There were no vitals filed for this visit.  Subjective Assessment - 11/27/18 1626    Subjective  Patient reports he is doing well and had a good weekend. He states his neck is hurting him today and it feels like it did before he had the stimulator placed for pain management.    Pertinent History  T10-11 Epidural Abscess, back surgery on 03/28/18, right knee partial medial menisectomy and chondroplasty    Limitations  Standing;Walking;House hold activities;Lifting    Diagnostic tests  CT Lumbar Spine 05/12/18: "Spinal stimulator device, along with multilevel multisegment fusion hardware appear appropriately placed without adverse features."    Patient Stated Goals  To walk    Currently in Pain?  Yes    Pain Score  4     Pain Location  Neck    Pain Orientation  Posterior;Lower;Upper    Pain Descriptors / Indicators  Headache;Aching    Pain Type  Acute pain    Pain Onset  In the past 7 days    Pain Frequency  Intermittent    Aggravating Factors   not sure    Pain Relieving Factors  not sure    Effect of Pain on Daily Activities  just pushes through it         Adult Aquatic Therapy - 11/27/18 1629      Aquatic Therapy Subjective   Subjective  Patient reports he is doing well and had a good weekend. He states his neck is hurting him today and it feels like it did before he had the stimulator placed for pain management.      Treatment   Gait  5x 20' forward gait with no UE support as walking warmup, patient with reduced scissoring except during turns. Patient ambulated throughout session with 5lb weights on ankles.     Exercises  1x 15 reps bil LE for breaststroke kick standing at wall, 5lbs weights on ankles. 1x 20 reps bil LE leg swing (front/back) with 5lb weights.     Specific Exercises  Hip/Low Back    Hip/Low Back  10 reps bil with pool noodle for "saw" exercise.    Balance  4 reps tandem stance, alt  foot alignment, 20 sec holds. Horse shoes: 4x horse shoe toss (4) in tandem stance, alt foot alignment.         PT Education - 11/27/18 1636    Education Details  Educated on importance of stretching and on need to attempt to catch balance in pool to improve postural reactions. Educated on exercises throughout.    Person(s) Educated  Patient    Methods  Explanation    Comprehension  Verbalized understanding       PT Short Term Goals - 11/22/18 1117      PT SHORT TERM GOAL #1   Title  Patient will report understanding and regular compliance with HEP to improve strength and overall functional mobility.     Baseline  11/15: Patient stated he has not been doing his exercises at home as often as he should.     Time  4    Period  Weeks    Status  Partially Met      PT SHORT TERM GOAL #2   Title  Patient will demonstrate improvement of 1/2 MMT strength grade in all muscle groups tested to assist patient with safe transfers, ambulation, and standing.     Baseline  11/15: see MMT    Time  4    Period  Weeks    Status  Partially Met      PT SHORT TERM GOAL #3   Title  Patient will report or demonstrate ability to perform ambulation  for 2 minutes with LRAD with no more than supervision assistance without rest break and reported no more than minimal difficulty.     Baseline  11/15: amb 55sec and then 1:18 this date with RW and min-mod A due to L knee buckle and BLE weakness; last reassessment was 2:30 straight wiht RW    Time  4    Period  Weeks    Status  Achieved      PT SHORT TERM GOAL #4   Title  Patient will demonstrate ability to ambulate 60 feet without rest break using LRAD with no more than minimal assistance.    Time  2    Period  Weeks    Status  Achieved        PT Long Term Goals - 11/22/18 1118      PT LONG TERM GOAL #1   Title  Patient will report or demonstrate ability to perform ambulation for 5 minutes with LRAD with no more than supervision assistance without rest break and reported no more than minimal difficulty.     Baseline  amb 2:45 straight with min guard and wc follow    Time  6    Period  Weeks    Status  On-going    Target Date  01/03/19      PT LONG TERM GOAL #2   Title  Patient will report ability to stand at a support surface for 10 minutes without a rest break in order to perform household chores with increased ease.     Baseline  4-5 minutes    Time  6    Period  Weeks    Status  On-going      PT LONG TERM GOAL #3   Title  Patient will demonstrate ability to ambulate 120 feet without rest break using LRAD with no more than minimal assistance.    Baseline  90 feet    Time  6    Period  Weeks    Status  On-going      PT LONG TERM GOAL #4   Title  Patient will perform 5x sit to stand from standard chair with good control and single UE assist in 16 seconds or less to demonstrate improved LE strength for functional transfers.    Time  6    Period  Weeks    Status  New        Plan - 11/27/18 1638    Clinical Impression Statement  Aquatic therapy continued today with initial focus on coordination and proprioceptive training. Ankle weights used for entire session to  facilitate improve proprioception. Patient performed standing breaststroke kick for coordination training and required min assist with Lt LE for placement to prevent scissoring pattern when he pulled his leg back to midline. He continued with tandem stance training today with dynamic challenges and required multiple steps to regain balance throughout. He will continue to benefit from skilled PT interventions to address impairments in aquatic and land-based setting to improve QOL and independence at home.    Rehab Potential  Fair    PT Frequency  3x / week    PT Duration  6 weeks    PT Treatment/Interventions  ADLs/Self Care Home Management;Aquatic Therapy;DME Instruction;Gait training;Functional mobility training;Stair training;Therapeutic activities;Therapeutic exercise;Balance training;Neuromuscular re-education;Patient/family education;Orthotic Fit/Training;Wheelchair mobility training;Manual techniques;Passive range of motion;Energy conservation;Dry needling;Splinting;Taping    PT Next Visit Plan  Monitor for any symptoms and take BP as needed. continue manual hip adductor stretch and hamstring stretch. Modify exercises as needed and resume standing exercises as able. gait training with RW and wc follow, hurdle step over in // bars and step ups.  Perform standing w/o UE support in // bars for endurance training and add cone taps and coordination drills/exercises (ladder). Progress step up training as able in // bars.    PT Home Exercise Plan  08/16/18:  seated DF/PF, LAQ, marching, abd with theraband and bridge; 11/13: HS and calf stretching (says he has been performing this at home)    Consulted and Agree with Plan of Care  Patient       Patient will benefit from skilled therapeutic intervention in order to improve the following deficits and impairments:  Abnormal gait, Decreased balance, Decreased endurance, Decreased mobility, Difficulty walking, Hypomobility, Decreased range of motion, Decreased  activity tolerance, Decreased strength, Impaired flexibility  Visit Diagnosis: Other abnormalities of gait and mobility  Other symptoms and signs involving the musculoskeletal system  Muscle weakness (generalized)     Problem List Patient Active Problem List   Diagnosis Date Noted  . Pseudogout of knee   . Pyogenic arthritis of right knee joint ([place])   . Falls frequently 05/08/2018  . Fall   . Sleep disturbance   . Post-op pain   . Hypokalemia   . Morbid obesity ([place])   . Postoperative pain   . DVT, lower extremity, distal, acute, bilateral ([place])   . Acute blood loss anemia   . Anemia of chronic disease   . Essential hypertension   . Bacteremia   . Myelopathy ([place]) 03/30/2018  . Paraplegia ([place])   . Postlaminectomy syndrome, lumbar region   . Epidural abscess   . Abdominal distension   . Encephalopathy   . Weakness of both lower extremities   . Discitis of thoracic region   . Spinal cord stimulator status   . Staphylococcus aureus sepsis ([place]) 03/20/2018  . Bacteremia due to methicillin susceptible Staphylococcus aureus (MSSA) 03/18/2018  . Obesity 03/18/2018  . Nephrolithiasis 03/16/2018  .  AKI (acute kidney injury) ([place]) 03/16/2018  . Cognitive impairment 03/16/2018  . Chronic pain syndrome 03/16/2018  . B12 deficiency 01/28/2016  . Memory difficulty 07/23/2015  . History of cerebrovascular disease 07/23/2015  . FH: colon cancer 08/13/2014  . Hx of adenomatous colonic polyps 08/13/2014  . Chronic back pain 05/30/2012  . CAD, NATIVE VESSEL 07/17/2010  . HYPERCHOLESTEROLEMIA 10/31/2009  . SMOKELESS TOBACCO ABUSE 10/31/2009  . Obstructive sleep apnea 10/31/2009  . Essential hypertension, benign 10/31/2009  . GERD 10/31/2009    Kipp Brood, PT, DPT Physical Therapist with [facility]  11/27/2018 4:40 PM    [facility] [address] [place], [place], [address] Phone: [phone]    Fax:  [phone]  Name: Cody Velez MRN: [id number] Date of Birth: 07/27/1946

## 2018-11-29 ENCOUNTER — Other Ambulatory Visit: Payer: Self-pay

## 2018-11-29 ENCOUNTER — Encounter (HOSPITAL_COMMUNITY): Payer: Self-pay

## 2018-11-29 ENCOUNTER — Ambulatory Visit (HOSPITAL_COMMUNITY): Payer: Medicare Other

## 2018-11-29 DIAGNOSIS — M6281 Muscle weakness (generalized): Secondary | ICD-10-CM | POA: Diagnosis not present

## 2018-11-29 DIAGNOSIS — R2689 Other abnormalities of gait and mobility: Secondary | ICD-10-CM

## 2018-11-29 DIAGNOSIS — R29898 Other symptoms and signs involving the musculoskeletal system: Secondary | ICD-10-CM

## 2018-11-29 NOTE — Therapy (Signed)
[place], [place], [address] Phone: [phone]   Fax:  [phone]  Physical Therapy Treatment  Patient Details  Name: REGGIE BISE MRN: [id number] Date of Birth: [birth year] Referring Provider (PT): Asencion Noble, MD   Encounter Date: 11/29/2018  PT End of Session - 11/29/18 1219    Visit Number  38    Number of Visits  61    Date for PT Re-Evaluation  11/24/18   Minireassess completed 11/03/18   Authorization Type  Primary: Medicare; Secondary: [employer] (75 visits per calendar year with 14 used at evaluation)    Authorization Time Period  08/15/18 - 10/14/18; NEW: 10/13/18 to 11/24/18; 11/22/18 - 01/03/18    Authorization - Visit Number  33   KX   Authorization - Number of Visits  75    PT Start Time  5631    PT Stop Time  1201    PT Time Calculation (min)  40 min    Equipment Utilized During Treatment  Gait belt    Activity Tolerance  Patient tolerated treatment well    Behavior During Therapy  WFL for tasks assessed/performed       Past Medical History:  Diagnosis Date  . Allergic rhinitis   . Arthritis   . B12 deficiency   . Cervicogenic headache 01/28/2016  . Childhood asthma   . Chronic back pain   . Coronary atherosclerosis of native coronary artery    Mild nonobstructive CAD at catheterization January 2015  . Depression   . Essential hypertension   . Falls   . GERD (gastroesophageal reflux disease)   . History of cerebrovascular disease 07/23/2015  . History of pneumonia 02/2011  . Hyperlipidemia   . Kidney stone   . Memory difficulty 07/23/2015  . OSA (obstructive sleep apnea)    CPAP - Dr. [name]  . Prostate cancer ([place])   . PTSD (post-traumatic stress disorder)    [place]  . Rectal bleeding     Past Surgical History:  Procedure Laterality Date  . APPLICATION OF ROBOTIC ASSISTANCE FOR SPINAL PROCEDURE N/A 03/28/2018   Procedure: APPLICATION OF ROBOTIC ASSISTANCE FOR SPINAL PROCEDURE;  Surgeon:  Kristeen Miss, MD;  Location: [facility];  Service: Neurosurgery;  Laterality: N/A;  . BACK SURGERY  02/14/12   lumbar OR #7; "today redid L1L2; replaced screws; added bone from hip"  . BILATERAL KNEE ARTHROSCOPY    . COLONOSCOPY  10/15/2008   Dr. [name]: tubular adenoma   . COLONOSCOPY  12/17/2003   [facility] rectal and colon  . COLONOSCOPY N/A 09/05/2014   Procedure: COLONOSCOPY;  Surgeon: Daneil Dolin, MD;  [facility];  Service: Endoscopy;  Laterality: N/A;  7:30-rescheduled 9/17 to [place] notified pt  . CYSTOSCOPY WITH STENT PLACEMENT Right 01/27/2016   Procedure: CYSTOSCOPY WITH STENT PLACEMENT;  Surgeon: [name], MD;  Location: [facility];  Service: Urology;  Laterality: Right;  . CYSTOSCOPY/RETROGRADE/URETEROSCOPY/STONE EXTRACTION WITH BASKET Right 01/27/2016   Procedure: CYSTOSCOPY, RIGHT RETROGRADE, RIGHT URETEROSCOPY, STONE EXTRACTION ;  Surgeon: [name], MD;  Location: [facility];  Service: Urology;  Laterality: Right;  . ESOPHAGOGASTRODUODENOSCOPY  10/15/2008     Dr Rourk:Schatzki's ring status post dilation and disruption via 30 F Maloney dilator/ otherwise unremarkable esophagus, small hiatal hernia, multiple fundal gland polyps not manipulated, gastritis, negative H.pylori  . ESOPHAGOGASTRODUODENOSCOPY  06/21/02   HYI:FOYDX sliding hiatal hernia with mild changes of reflux esophagitis limited to gastroesophageal junction.  Noncritical ring at  distal esophagus, 3 cm proximal to gastroesophageal junction/Antral gastritis  . [place]   "broke face playing softball"  . FRACTURE SURGERY     "left wrist; broke it; took spur off"  . HOLMIUM LASER APPLICATION Right 02/20/75   Procedure: HOLMIUM LASER APPLICATION;  Surgeon: [name], MD;  Location: [facility];  Service: Urology;  Laterality: Right;  . KNEE ARTHROSCOPY Right 05/18/2018   Procedure: PARTIAL MEDIAL MENISECTOMY AND SURGICAL LAVAGE AND CHONDROPLASTY;  Surgeon: [name], MD;  Location: [facility];  Service: Orthopedics;  Laterality: Right;  . LEFT HEART CATHETERIZATION WITH CORONARY ANGIOGRAM N/A 12/27/2013   Procedure: LEFT HEART CATHETERIZATION WITH CORONARY ANGIOGRAM;  Surgeon: [name], MD;  Location: [facility] C[facility];  Service: Cardiovascular;  Laterality: N/A;  . neck epidural    . POSTERIOR LUMBAR FUSION 4 LEVEL N/A 03/28/2018   Procedure: Thoracic eight -Lumbar two- FIXATION WITH SCREW PLACEMENT, DECOMPRESSION Thoracic ten-Thoracic eleven  FOR OSTEOMYELITIS;  Surgeon: Kristeen Miss, MD;  Location: [facility];  Service: Neurosurgery;  Laterality: N/A;  . SHOULDER SURGERY Bilateral   . TEE WITHOUT CARDIOVERSION N/A 03/21/2018   Procedure: TRANSESOPHAGEAL ECHOCARDIOGRAM (TEE) WITH PROPOFOL;  Surgeon: [name], MD;  [facility];  Service: Cardiovascular;  Laterality: N/A;    There were no vitals filed for this visit.  Subjective Assessment - 11/29/18 1209    Subjective  Patient reports he is having neck pain again and has a bad headache today. He states his pain is around a 7/10 on arrival. He reports it hurts when he turns his head and he has been in touch with his pain doctor who placed the implant in his back. He reports they are supposed to call him today.    Pertinent History  T10-11 Epidural Abscess, back surgery on 03/28/18, right knee partial medial menisectomy and chondroplasty    Limitations  Standing;Walking;House hold activities;Lifting    Diagnostic tests  CT Lumbar Spine 05/12/18: "Spinal stimulator device, along with multilevel multisegment fusion hardware appear appropriately placed without adverse features."    Patient Stated Goals  To walk    Currently in Pain?  Yes    Pain Score  7     Pain Location  Neck    Pain Orientation  Posterior;Upper;Mid;Lower    Pain Descriptors / Indicators  Headache;Aching    Pain Type  Acute pain    Pain Onset  In the past 7 days    Pain Frequency  Constant    Aggravating Factors   turning  head seems to bother it    Pain Relieving Factors  percocet    Effect of Pain on Daily Activities  pushes through it        [facility] Adult PT Treatment/Exercise - 11/29/18 0001      Ambulation/Gait   Ambulation/Gait  Yes    Ambulation/Gait Assistance  4: Min guard   w/c follow   Ambulation Distance (Feet)  90 Feet   3x 30'   Assistive device  Rolling walker    Gait Pattern  Decreased dorsiflexion - left;Decreased dorsiflexion - right;Trunk flexed;Decreased step length - right;Decreased step length - left;Step-through pattern      Exercises   Exercises  Knee/Hip      Knee/Hip Exercises: Stretches   Passive Hamstring Stretch  Both;5 reps;30 seconds;Limitations    Passive Hamstring Stretch Limitations  supine, manual stretch by therapist    Other Knee/Hip Stretches  Supine hip adductor stretch, bil LE's, 5 reps,  30 seconds      Manual Therapy   Manual Therapy  Soft tissue mobilization    Manual therapy comments  completed seperate from other interventions    Soft tissue mobilization  efflurage to bil upper trapezius and cervical parasinals with patient seated in w/c.         PT Education - 11/29/18 1218    Education Details  Educated on importance of stretching and on possibel muscle pain referral from uppper trap and levator scap to create a headache.     Person(s) Educated  Patient    Methods  Explanation    Comprehension  Verbalized understanding       PT Short Term Goals - 11/22/18 1117      PT SHORT TERM GOAL #1   Title  Patient will report understanding and regular compliance with HEP to improve strength and overall functional mobility.     Baseline  11/15: Patient stated he has not been doing his exercises at home as often as he should.     Time  4    Period  Weeks    Status  Partially Met      PT SHORT TERM GOAL #2   Title  Patient will demonstrate improvement of 1/2 MMT strength grade in all muscle groups tested to assist patient with safe transfers, ambulation,  and standing.     Baseline  11/15: see MMT    Time  4    Period  Weeks    Status  Partially Met      PT SHORT TERM GOAL #3   Title  Patient will report or demonstrate ability to perform ambulation for 2 minutes with LRAD with no more than supervision assistance without rest break and reported no more than minimal difficulty.     Baseline  11/15: amb 55sec and then 1:18 this date with RW and min-mod A due to L knee buckle and BLE weakness; last reassessment was 2:30 straight wiht RW    Time  4    Period  Weeks    Status  Achieved      PT SHORT TERM GOAL #4   Title  Patient will demonstrate ability to ambulate 60 feet without rest break using LRAD with no more than minimal assistance.    Time  2    Period  Weeks    Status  Achieved        PT Long Term Goals - 11/22/18 1118      PT LONG TERM GOAL #1   Title  Patient will report or demonstrate ability to perform ambulation for 5 minutes with LRAD with no more than supervision assistance without rest break and reported no more than minimal difficulty.     Baseline  amb 2:45 straight with min guard and wc follow    Time  6    Period  Weeks    Status  On-going    Target Date  01/03/19      PT LONG TERM GOAL #2   Title  Patient will report ability to stand at a support surface for 10 minutes without a rest break in order to perform household chores with increased ease.     Baseline  4-5 minutes    Time  6    Period  Weeks    Status  On-going      PT LONG TERM GOAL #3   Title  Patient will demonstrate ability to ambulate 120 feet without rest break using LRAD  with no more than minimal assistance.    Baseline  90 feet    Time  6    Period  Weeks    Status  On-going      PT LONG TERM GOAL #4   Title  Patient will perform 5x sit to stand from standard chair with good control and single UE assist in 16 seconds or less to demonstrate improved LE strength for functional transfers.    Time  6    Period  Weeks    Status  New         Plan - 11/29/18 1219    Clinical Impression Statement  Patient arrived with ongoing report of headache at start of session and is awaiting a call from pain management today. He states they feel he might need imaging to make sure the leads on his implant are still intact. Session focused on LE hip stretching and manual interventions to alleviate headache and neck pain. Soft tissue mobilization performed to upper trap and paraspinals, patient had increased turgor in muscles and multiple knots. He reported minimal decrease in pain/headache after soft tissue. Gait training performed at [facility] with wheelchair follow. He demonstrated improve knee extension bilaterally on first bout however as he fatigued this was more difficult to maintain and he began scissoring Lt LE. He continues to rely heavily on UE support and has elevated shoulders while supporting himself with RW. He will continue to benefit from skilled PT interventions to address impairments and progress independence with functional mobility.    Rehab Potential  Fair    PT Frequency  3x / week    PT Duration  6 weeks    PT Treatment/Interventions  ADLs/Self Care Home Management;Aquatic Therapy;DME Instruction;Gait training;Functional mobility training;Stair training;Therapeutic activities;Therapeutic exercise;Balance training;Neuromuscular re-education;Patient/family education;Orthotic Fit/Training;Wheelchair mobility training;Manual techniques;Passive range of motion;Energy conservation;Dry needling;Splinting;Taping    PT Next Visit Plan  Follow up about pain management doctor and implant. Follow up about neck pain/headache. Continue manual hip adductor stretch and hamstring stretch. Modify exercises as needed and resume standing exercises as able. Gait training with RW and wc follow, hurdle step over in // bars and step ups. Progress step up training as able in // bars.    PT Home Exercise Plan  08/16/18:  seated DF/PF, LAQ, marching, abd with  theraband and bridge; 11/13: HS and calf stretching (says he has been performing this at home)    Consulted and Agree with Plan of Care  Patient       Patient will benefit from skilled therapeutic intervention in order to improve the following deficits and impairments:  Abnormal gait, Decreased balance, Decreased endurance, Decreased mobility, Difficulty walking, Hypomobility, Decreased range of motion, Decreased activity tolerance, Decreased strength, Impaired flexibility  Visit Diagnosis: Other abnormalities of gait and mobility  Other symptoms and signs involving the musculoskeletal system  Muscle weakness (generalized)     Problem List Patient Active Problem List   Diagnosis Date Noted  . Pseudogout of knee   . Pyogenic arthritis of right knee joint ([place])   . Falls frequently 05/08/2018  . Fall   . Sleep disturbance   . Post-op pain   . Hypokalemia   . Morbid obesity ([place])   . Postoperative pain   . DVT, lower extremity, distal, acute, bilateral ([place])   . Acute blood loss anemia   . Anemia of chronic disease   . Essential hypertension   . Bacteremia   . Myelopathy ([place]) 03/30/2018  . Paraplegia ([place])   .  Postlaminectomy syndrome, lumbar region   . Epidural abscess   . Abdominal distension   . Encephalopathy   . Weakness of both lower extremities   . Discitis of thoracic region   . Spinal cord stimulator status   . Staphylococcus aureus sepsis ([place]) 03/20/2018  . Bacteremia due to methicillin susceptible Staphylococcus aureus (MSSA) 03/18/2018  . Obesity 03/18/2018  . Nephrolithiasis 03/16/2018  . AKI (acute kidney injury) ([place]) 03/16/2018  . Cognitive impairment 03/16/2018  . Chronic pain syndrome 03/16/2018  . B12 deficiency 01/28/2016  . Memory difficulty 07/23/2015  . History of cerebrovascular disease 07/23/2015  . FH: colon cancer 08/13/2014  . Hx of adenomatous colonic polyps 08/13/2014  . Chronic back pain 05/30/2012  . CAD, NATIVE VESSEL 07/17/2010   . HYPERCHOLESTEROLEMIA 10/31/2009  . SMOKELESS TOBACCO ABUSE 10/31/2009  . Obstructive sleep apnea 10/31/2009  . Essential hypertension, benign 10/31/2009  . GERD 10/31/2009    Kipp Brood, PT, DPT Physical Therapist with [facility]  11/29/2018 12:26 PM    [facility] [address] [place], [place], [address] Phone: [phone]   Fax:  [phone]  Name: TYWAUN HILTNER MRN: [id number] Date of Birth: 10/14/1946

## 2018-12-01 ENCOUNTER — Ambulatory Visit (HOSPITAL_COMMUNITY): Payer: Medicare Other | Admitting: Physical Therapy

## 2018-12-01 ENCOUNTER — Encounter (HOSPITAL_COMMUNITY): Payer: Self-pay | Admitting: Physical Therapy

## 2018-12-01 DIAGNOSIS — R2689 Other abnormalities of gait and mobility: Secondary | ICD-10-CM | POA: Diagnosis not present

## 2018-12-01 DIAGNOSIS — R29898 Other symptoms and signs involving the musculoskeletal system: Secondary | ICD-10-CM | POA: Diagnosis not present

## 2018-12-01 DIAGNOSIS — M6281 Muscle weakness (generalized): Secondary | ICD-10-CM

## 2018-12-01 NOTE — Therapy (Signed)
[place], [place], [address] Phone: [phone]   Fax:  [phone]  Physical Therapy Treatment  Patient Details  Name: Cody Velez MRN: [id number] Date of Birth: [birth year] Referring Provider (PT): Asencion Noble, MD   Encounter Date: 12/01/2018  PT End of Session - 12/01/18 1217    Visit Number  39    Number of Visits  12    Date for PT Re-Evaluation  11/24/18   Minireassess completed 11/03/18   Authorization Type  Primary: Medicare; Secondary: [employer] (75 visits per calendar year with 14 used at evaluation)    Authorization Time Period  08/15/18 - 10/14/18; NEW: 10/13/18 to 11/24/18; 11/22/18 - 01/03/18    Authorization - Visit Number  63   KX   Authorization - Number of Visits  75    PT Start Time  1120    PT Stop Time  1200    PT Time Calculation (min)  40 min    Equipment Utilized During Treatment  Gait belt    Activity Tolerance  Patient tolerated treatment well    Behavior During Therapy  WFL for tasks assessed/performed       Past Medical History:  Diagnosis Date  . Allergic rhinitis   . Arthritis   . B12 deficiency   . Cervicogenic headache 01/28/2016  . Childhood asthma   . Chronic back pain   . Coronary atherosclerosis of native coronary artery    Mild nonobstructive CAD at catheterization January 2015  . Depression   . Essential hypertension   . Falls   . GERD (gastroesophageal reflux disease)   . History of cerebrovascular disease 07/23/2015  . History of pneumonia 02/2011  . Hyperlipidemia   . Kidney stone   . Memory difficulty 07/23/2015  . OSA (obstructive sleep apnea)    CPAP - Dr. [name]  . Prostate cancer ([place])   . PTSD (post-traumatic stress disorder)    [place]  . Rectal bleeding     Past Surgical History:  Procedure Laterality Date  . APPLICATION OF ROBOTIC ASSISTANCE FOR SPINAL PROCEDURE N/A 03/28/2018   Procedure: APPLICATION OF ROBOTIC ASSISTANCE FOR SPINAL PROCEDURE;  Surgeon:  Kristeen Miss, MD;  Location: [facility];  Service: Neurosurgery;  Laterality: N/A;  . BACK SURGERY  02/14/12   lumbar OR #7; "today redid L1L2; replaced screws; added bone from hip"  . BILATERAL KNEE ARTHROSCOPY    . COLONOSCOPY  10/15/2008   Dr. [name]: tubular adenoma   . COLONOSCOPY  12/17/2003   QQV:ZDGLOV rectal and colon  . COLONOSCOPY N/A 09/05/2014   Procedure: COLONOSCOPY;  Surgeon: Daneil Dolin, MD;  [facility];  Service: Endoscopy;  Laterality: N/A;  7:30-rescheduled 9/17 to [place] notified pt  . CYSTOSCOPY WITH STENT PLACEMENT Right 01/27/2016   Procedure: CYSTOSCOPY WITH STENT PLACEMENT;  Surgeon: [name], MD;  Location: [facility];  Service: Urology;  Laterality: Right;  . CYSTOSCOPY/RETROGRADE/URETEROSCOPY/STONE EXTRACTION WITH BASKET Right 01/27/2016   Procedure: CYSTOSCOPY, RIGHT RETROGRADE, RIGHT URETEROSCOPY, STONE EXTRACTION ;  Surgeon: [name], MD;  Location: [facility];  Service: Urology;  Laterality: Right;  . ESOPHAGOGASTRODUODENOSCOPY  10/15/2008     Dr Rourk:Schatzki's ring status post dilation and disruption via 59 F Maloney dilator/ otherwise unremarkable esophagus, small hiatal hernia, multiple fundal gland polyps not manipulated, gastritis, negative H.pylori  . ESOPHAGOGASTRODUODENOSCOPY  06/21/02   FIE:PPIRJ sliding hiatal hernia with mild changes of reflux esophagitis limited to gastroesophageal junction.  Noncritical ring at  distal esophagus, 3 cm proximal to gastroesophageal junction/Antral gastritis  . [place]   "broke face playing softball"  . FRACTURE SURGERY     "left wrist; broke it; took spur off"  . HOLMIUM LASER APPLICATION Right 04/22/980   Procedure: HOLMIUM LASER APPLICATION;  Surgeon: [name], MD;  Location: [facility];  Service: Urology;  Laterality: Right;  . KNEE ARTHROSCOPY Right 05/18/2018   Procedure: PARTIAL MEDIAL MENISECTOMY AND SURGICAL LAVAGE AND CHONDROPLASTY;  Surgeon: [name], MD;  Location: [facility];  Service: Orthopedics;  Laterality: Right;  . LEFT HEART CATHETERIZATION WITH CORONARY ANGIOGRAM N/A 12/27/2013   Procedure: LEFT HEART CATHETERIZATION WITH CORONARY ANGIOGRAM;  Surgeon: [name], MD;  Location: [facility] CATH [facility];  Service: Cardiovascular;  Laterality: N/A;  . neck epidural    . POSTERIOR LUMBAR FUSION 4 LEVEL N/A 03/28/2018   Procedure: Thoracic eight -Lumbar two- FIXATION WITH SCREW PLACEMENT, DECOMPRESSION Thoracic ten-Thoracic eleven  FOR OSTEOMYELITIS;  Surgeon: Kristeen Miss, MD;  Location: [facility];  Service: Neurosurgery;  Laterality: N/A;  . SHOULDER SURGERY Bilateral   . TEE WITHOUT CARDIOVERSION N/A 03/21/2018   Procedure: TRANSESOPHAGEAL ECHOCARDIOGRAM (TEE) WITH PROPOFOL;  Surgeon: [name], MD;  [facility];  Service: Cardiovascular;  Laterality: N/A;    There were no vitals filed for this visit.  Subjective Assessment - 12/01/18 1215    Subjective  Patient reported that he is continuing to have neck pain and a headache which he rated as a 7/10.     Pertinent History  T10-11 Epidural Abscess, back surgery on 03/28/18, right knee partial medial menisectomy and chondroplasty    Limitations  Standing;Walking;House hold activities;Lifting    Diagnostic tests  CT Lumbar Spine 05/12/18: "Spinal stimulator device, along with multilevel multisegment fusion hardware appear appropriately placed without adverse features."    Patient Stated Goals  To walk    Currently in Pain?  Yes    Pain Score  7     Pain Location  Neck    Pain Orientation  Posterior    Pain Descriptors / Indicators  Headache;Aching    Pain Type  Acute pain    Pain Onset  1 to 4 weeks ago                       [facility] Adult PT Treatment/Exercise - 12/01/18 0001      Ambulation/Gait   Ambulation/Gait  Yes    Ambulation/Gait Assistance  4: Min guard   W/C follow   Ambulation Distance (Feet)  201 Feet   3 bouts, 66', 64', 71'   Assistive  device  Rolling walker    Gait Pattern  Decreased dorsiflexion - left;Decreased dorsiflexion - right;Trunk flexed;Decreased step length - right;Decreased step length - left;Step-through pattern;Scissoring    Gait Comments  Last bout of ambulation weaving between 2 cones to work on obstacles with ambulation. Verbal cues to take smaller steps and stay near the walker      Knee/Hip Exercises: Stretches   Passive Hamstring Stretch  Both;5 reps;30 seconds;Limitations    Passive Hamstring Stretch Limitations  seated manual stretch by therapist      Knee/Hip Exercises: Seated   Other Seated Knee/Hip Exercises  seated cone taps (x2 cones) BLE 2 x 10 each LE             PT Education - 12/01/18 1216    Education Details  Educated on proper technique and form of exercises  throughout.     Person(s) Educated  Patient    Methods  Explanation    Comprehension  Verbalized understanding       PT Short Term Goals - 11/22/18 1117      PT SHORT TERM GOAL #1   Title  Patient will report understanding and regular compliance with HEP to improve strength and overall functional mobility.     Baseline  11/15: Patient stated he has not been doing his exercises at home as often as he should.     Time  4    Period  Weeks    Status  Partially Met      PT SHORT TERM GOAL #2   Title  Patient will demonstrate improvement of 1/2 MMT strength grade in all muscle groups tested to assist patient with safe transfers, ambulation, and standing.     Baseline  11/15: see MMT    Time  4    Period  Weeks    Status  Partially Met      PT SHORT TERM GOAL #3   Title  Patient will report or demonstrate ability to perform ambulation for 2 minutes with LRAD with no more than supervision assistance without rest break and reported no more than minimal difficulty.     Baseline  11/15: amb 55sec and then 1:18 this date with RW and min-mod A due to L knee buckle and BLE weakness; last reassessment was 2:30 straight wiht RW     Time  4    Period  Weeks    Status  Achieved      PT SHORT TERM GOAL #4   Title  Patient will demonstrate ability to ambulate 60 feet without rest break using LRAD with no more than minimal assistance.    Time  2    Period  Weeks    Status  Achieved        PT Long Term Goals - 11/22/18 1118      PT LONG TERM GOAL #1   Title  Patient will report or demonstrate ability to perform ambulation for 5 minutes with LRAD with no more than supervision assistance without rest break and reported no more than minimal difficulty.     Baseline  amb 2:45 straight with min guard and wc follow    Time  6    Period  Weeks    Status  On-going    Target Date  01/03/19      PT LONG TERM GOAL #2   Title  Patient will report ability to stand at a support surface for 10 minutes without a rest break in order to perform household chores with increased ease.     Baseline  4-5 minutes    Time  6    Period  Weeks    Status  On-going      PT LONG TERM GOAL #3   Title  Patient will demonstrate ability to ambulate 120 feet without rest break using LRAD with no more than minimal assistance.    Baseline  90 feet    Time  6    Period  Weeks    Status  On-going      PT LONG TERM GOAL #4   Title  Patient will perform 5x sit to stand from standard chair with good control and single UE assist in 16 seconds or less to demonstrate improved LE strength for functional transfers.    Time  6    Period  Weeks  Status  New            Plan - 12/01/18 1219    Clinical Impression Statement  This session patient was able to ambulate further than previous session on three bouts of ambulation. This session also incorporated 2 cones during ambulation to work on avoiding obstacles with ambulation. Patient continued to require cueing to stay close to the walker with ambulation. Patient would benefit from continued skilled physical therapy in order to continue progressing towards goals.     Rehab Potential  Fair     PT Frequency  3x / week    PT Duration  6 weeks    PT Treatment/Interventions  ADLs/Self Care Home Management;Aquatic Therapy;DME Instruction;Gait training;Functional mobility training;Stair training;Therapeutic activities;Therapeutic exercise;Balance training;Neuromuscular re-education;Patient/family education;Orthotic Fit/Training;Wheelchair mobility training;Manual techniques;Passive range of motion;Energy conservation;Dry needling;Splinting;Taping    PT Next Visit Plan  Follow up about pain management doctor and implant. Follow up about neck pain/headache. Continue manual hip adductor stretch and hamstring stretch. Modify exercises as needed and resume standing exercises as able. Gait training with RW and wc follow, hurdle step over in // bars and step ups. Progress step up training as able in // bars. Consider progressing ambulation with obstacles.     PT Home Exercise Plan  08/16/18:  seated DF/PF, LAQ, marching, abd with theraband and bridge; 11/13: HS and calf stretching (says he has been performing this at home)    Consulted and Agree with Plan of Care  Patient       Patient will benefit from skilled therapeutic intervention in order to improve the following deficits and impairments:  Abnormal gait, Decreased balance, Decreased endurance, Decreased mobility, Difficulty walking, Hypomobility, Decreased range of motion, Decreased activity tolerance, Decreased strength, Impaired flexibility  Visit Diagnosis: Other abnormalities of gait and mobility  Other symptoms and signs involving the musculoskeletal system  Muscle weakness (generalized)     Problem List Patient Active Problem List   Diagnosis Date Noted  . Pseudogout of knee   . Pyogenic arthritis of right knee joint ([place])   . Falls frequently 05/08/2018  . Fall   . Sleep disturbance   . Post-op pain   . Hypokalemia   . Morbid obesity ([place])   . Postoperative pain   . DVT, lower extremity, distal, acute, bilateral ([place])   .  Acute blood loss anemia   . Anemia of chronic disease   . Essential hypertension   . Bacteremia   . Myelopathy ([place]) 03/30/2018  . Paraplegia ([place])   . Postlaminectomy syndrome, lumbar region   . Epidural abscess   . Abdominal distension   . Encephalopathy   . Weakness of both lower extremities   . Discitis of thoracic region   . Spinal cord stimulator status   . Staphylococcus aureus sepsis ([place]) 03/20/2018  . Bacteremia due to methicillin susceptible Staphylococcus aureus (MSSA) 03/18/2018  . Obesity 03/18/2018  . Nephrolithiasis 03/16/2018  . AKI (acute kidney injury) ([place]) 03/16/2018  . Cognitive impairment 03/16/2018  . Chronic pain syndrome 03/16/2018  . B12 deficiency 01/28/2016  . Memory difficulty 07/23/2015  . History of cerebrovascular disease 07/23/2015  . FH: colon cancer 08/13/2014  . Hx of adenomatous colonic polyps 08/13/2014  . Chronic back pain 05/30/2012  . CAD, NATIVE VESSEL 07/17/2010  . HYPERCHOLESTEROLEMIA 10/31/2009  . SMOKELESS TOBACCO ABUSE 10/31/2009  . Obstructive sleep apnea 10/31/2009  . Essential hypertension, benign 10/31/2009  . GERD 10/31/2009   Clarene Critchley PT, DPT 12:23 PM, 12/01/18 [phone]  [place]  [facility] [address] [place], [place], [address] Phone: [phone]   Fax:  [phone]  Name: Cody Velez MRN: [id number] Date of Birth: [birth year]

## 2018-12-04 ENCOUNTER — Other Ambulatory Visit: Payer: Self-pay

## 2018-12-04 ENCOUNTER — Ambulatory Visit (HOSPITAL_COMMUNITY): Payer: Medicare Other

## 2018-12-04 ENCOUNTER — Encounter (HOSPITAL_COMMUNITY): Payer: Self-pay

## 2018-12-04 DIAGNOSIS — R2689 Other abnormalities of gait and mobility: Secondary | ICD-10-CM

## 2018-12-04 DIAGNOSIS — R29898 Other symptoms and signs involving the musculoskeletal system: Secondary | ICD-10-CM | POA: Diagnosis not present

## 2018-12-04 DIAGNOSIS — M6281 Muscle weakness (generalized): Secondary | ICD-10-CM | POA: Diagnosis not present

## 2018-12-04 NOTE — Therapy (Signed)
[facility] [place], [place], [address] Phone: [phone]   Fax:  [phone]  Physical Therapy Aquatic Treatment  Patient Details  Name: Cody Velez MRN: [id number] Date of Birth: Dec 01, 1946 Referring Provider (PT): Asencion Noble, MD   Encounter Date: 12/04/2018  PT End of Session - 12/04/18 1720    Visit Number  40    Number of Visits  61    Date for PT Re-Evaluation  11/24/18   Minireassess completed 11/03/18   Authorization Type  Primary: Medicare; Secondary: [employer] (75 visits per calendar year with 14 used at evaluation)    Authorization Time Period  08/15/18 - 10/14/18; NEW: 10/13/18 to 11/24/18; 11/22/18 - 01/03/18    Authorization - Visit Number  66   KX   Authorization - Number of Visits  75    PT Start Time  1157    PT Stop Time  1423    PT Time Calculation (min)  45 min    Equipment Utilized During Treatment  Gait belt    Activity Tolerance  Patient tolerated treatment well    Behavior During Therapy  WFL for tasks assessed/performed       Past Medical History:  Diagnosis Date  . Allergic rhinitis   . Arthritis   . B12 deficiency   . Cervicogenic headache 01/28/2016  . Childhood asthma   . Chronic back pain   . Coronary atherosclerosis of native coronary artery    Mild nonobstructive CAD at catheterization January 2015  . Depression   . Essential hypertension   . Falls   . GERD (gastroesophageal reflux disease)   . History of cerebrovascular disease 07/23/2015  . History of pneumonia 02/2011  . Hyperlipidemia   . Kidney stone   . Memory difficulty 07/23/2015  . OSA (obstructive sleep apnea)    CPAP - Dr. [name]  . Prostate cancer ([place])   . PTSD (post-traumatic stress disorder)    [place]  . Rectal bleeding     Past Surgical History:  Procedure Laterality Date  . APPLICATION OF ROBOTIC ASSISTANCE FOR SPINAL PROCEDURE N/A 03/28/2018   Procedure: APPLICATION OF ROBOTIC ASSISTANCE FOR SPINAL PROCEDURE;   Surgeon: Kristeen Miss, MD;  Location: [facility];  Service: Neurosurgery;  Laterality: N/A;  . BACK SURGERY  02/14/12   lumbar OR #7; "today redid L1L2; replaced screws; added bone from hip"  . BILATERAL KNEE ARTHROSCOPY    . COLONOSCOPY  10/15/2008   Dr. [name]: tubular adenoma   . COLONOSCOPY  12/17/2003   WIO:MBTDHR rectal and colon  . COLONOSCOPY N/A 09/05/2014   Procedure: COLONOSCOPY;  Surgeon: Daneil Dolin, MD;  [facility];  Service: Endoscopy;  Laterality: N/A;  7:30-rescheduled 9/17 to [place] notified pt  . CYSTOSCOPY WITH STENT PLACEMENT Right 01/27/2016   Procedure: CYSTOSCOPY WITH STENT PLACEMENT;  Surgeon: [name], MD;  Location: [facility];  Service: Urology;  Laterality: Right;  . CYSTOSCOPY/RETROGRADE/URETEROSCOPY/STONE EXTRACTION WITH BASKET Right 01/27/2016   Procedure: CYSTOSCOPY, RIGHT RETROGRADE, RIGHT URETEROSCOPY, STONE EXTRACTION ;  Surgeon: [name], MD;  Location: [facility];  Service: Urology;  Laterality: Right;  . ESOPHAGOGASTRODUODENOSCOPY  10/15/2008     Dr Rourk:Schatzki's ring status post dilation and disruption via 64 F Maloney dilator/ otherwise unremarkable esophagus, small hiatal hernia, multiple fundal gland polyps not manipulated, gastritis, negative H.pylori  . ESOPHAGOGASTRODUODENOSCOPY  06/21/02   CBU:LAGTX sliding hiatal hernia with mild changes of reflux esophagitis limited to gastroesophageal junction.  Noncritical ring  at distal esophagus, 3 cm proximal to gastroesophageal junction/Antral gastritis  . [place]   "broke face playing softball"  . FRACTURE SURGERY     "left wrist; broke it; took spur off"  . HOLMIUM LASER APPLICATION Right 02/25/4664   Procedure: HOLMIUM LASER APPLICATION;  Surgeon: [name], MD;  Location: [facility];  Service: Urology;  Laterality: Right;  . KNEE ARTHROSCOPY Right 05/18/2018   Procedure: PARTIAL MEDIAL MENISECTOMY AND SURGICAL LAVAGE AND CHONDROPLASTY;  Surgeon:  [name], MD;  Location: [facility];  Service: Orthopedics;  Laterality: Right;  . LEFT HEART CATHETERIZATION WITH CORONARY ANGIOGRAM N/A 12/27/2013   Procedure: LEFT HEART CATHETERIZATION WITH CORONARY ANGIOGRAM;  Surgeon: [name], MD;  [facility];  Service: Cardiovascular;  Laterality: N/A;  . neck epidural    . POSTERIOR LUMBAR FUSION 4 LEVEL N/A 03/28/2018   Procedure: Thoracic eight -Lumbar two- FIXATION WITH SCREW PLACEMENT, DECOMPRESSION Thoracic ten-Thoracic eleven  FOR OSTEOMYELITIS;  Surgeon: Kristeen Miss, MD;  Location: [facility];  Service: Neurosurgery;  Laterality: N/A;  . SHOULDER SURGERY Bilateral   . TEE WITHOUT CARDIOVERSION N/A 03/21/2018   Procedure: TRANSESOPHAGEAL ECHOCARDIOGRAM (TEE) WITH PROPOFOL;  Surgeon: [name], MD;  [facility];  Service: Cardiovascular;  Laterality: N/A;    There were no vitals filed for this visit.  Subjective Assessment - 12/04/18 1719    Subjective  Patient reports spoke to his pain management doctors and he was instructed to change the setting on his stimulator. He reports it is helping a little with his headaches. He reports he is planning to see his MD and have botox injection for his neck paina nd headaches.     Pertinent History  T10-11 Epidural Abscess, back surgery on 03/28/18, right knee partial medial menisectomy and chondroplasty    Limitations  Standing;Walking;House hold activities;Lifting    Diagnostic tests  CT Lumbar Spine 05/12/18: "Spinal stimulator device, along with multilevel multisegment fusion hardware appear appropriately placed without adverse features."    Patient Stated Goals  To walk    Currently in Pain?  No/denies         Transfers:  Squat pivot transfer performed 2xwith set up assistance and Mod I levelfrom manual wheelchair to pool chair lift. Patient utilized pool chair lift to get in/out of pool for therapy session   Adult Aquatic Therapy - 12/04/18 1723      Aquatic Therapy  Subjective   Subjective  Patient reports spoke to his pain management doctors and he was instructed to change the setting on his stimulator. He reports it is helping a little with his headaches. He reports he is planning to see his MD and have botox injection for his neck paina nd headaches.      Treatment   Gait  5x 10' forward/backward resisted gait with kickboard under water as walking warmup. Performed with 8lb ankle weights on bil LE's.    Exercises  1x 15 reps bil LE for breaststroke kick standing at wall, 8lbs weights on ankles. 1x 20 reps bil LE leg swing (front/back) with 8lb weights.    Specific Exercises  --    Balance  "Golf/hockey" activity with maintaining squat starting position. Patient challenged with sweeping/picking up sinkable rings with hockey stick by performing hockey/golf swing while holding balance. He required multiple attempts to regain balance and maintain against movement of water. 4x RT for side stepping on pool noodle with bil UE support. Cues required for proper posture  and to facilitate foot placement on noodle.         PT Education - 12/04/18 1736    Education Details  Educated on exercises and weight shift strategies with balance exercises throughout.    Person(s) Educated  Patient    Methods  Explanation;Demonstration;Tactile cues    Comprehension  Verbalized understanding;Returned demonstration       PT Short Term Goals - 11/22/18 1117      PT SHORT TERM GOAL #1   Title  Patient will report understanding and regular compliance with HEP to improve strength and overall functional mobility.     Baseline  11/15: Patient stated he has not been doing his exercises at home as often as he should.     Time  4    Period  Weeks    Status  Partially Met      PT SHORT TERM GOAL #2   Title  Patient will demonstrate improvement of 1/2 MMT strength grade in all muscle groups tested to assist patient with safe transfers, ambulation, and standing.     Baseline   11/15: see MMT    Time  4    Period  Weeks    Status  Partially Met      PT SHORT TERM GOAL #3   Title  Patient will report or demonstrate ability to perform ambulation for 2 minutes with LRAD with no more than supervision assistance without rest break and reported no more than minimal difficulty.     Baseline  11/15: amb 55sec and then 1:18 this date with RW and min-mod A due to L knee buckle and BLE weakness; last reassessment was 2:30 straight wiht RW    Time  4    Period  Weeks    Status  Achieved      PT SHORT TERM GOAL #4   Title  Patient will demonstrate ability to ambulate 60 feet without rest break using LRAD with no more than minimal assistance.    Time  2    Period  Weeks    Status  Achieved        PT Long Term Goals - 11/22/18 1118      PT LONG TERM GOAL #1   Title  Patient will report or demonstrate ability to perform ambulation for 5 minutes with LRAD with no more than supervision assistance without rest break and reported no more than minimal difficulty.     Baseline  amb 2:45 straight with min guard and wc follow    Time  6    Period  Weeks    Status  On-going    Target Date  01/03/19      PT LONG TERM GOAL #2   Title  Patient will report ability to stand at a support surface for 10 minutes without a rest break in order to perform household chores with increased ease.     Baseline  4-5 minutes    Time  6    Period  Weeks    Status  On-going      PT LONG TERM GOAL #3   Title  Patient will demonstrate ability to ambulate 120 feet without rest break using LRAD with no more than minimal assistance.    Baseline  90 feet    Time  6    Period  Weeks    Status  On-going      PT LONG TERM GOAL #4   Title  Patient will perform 5x sit to  stand from standard chair with good control and single UE assist in 16 seconds or less to demonstrate improved LE strength for functional transfers.    Time  6    Period  Weeks    Status  New        Plan - 12/04/18 1721     Clinical Impression Statement  Continued with aquatic therapy today and focus of session placed on proprioception and balance training. Patient continued with ankle weights throughout session to facilitate proprioception/awareness and for strength training. He performed side stepping on pool noodle for step coordination and required minimal tactile cues to facilitate foot positioning. With legs swings tactile cues/assist was required to reduce scissoring swing/step pattern. Patient had difficulty with balance activity to sweep rings from pool floor with hockey stick and had tendency to weight shift posteriorly. He required multiple steps to regain balance throughout. He will continue to benefit from skilled PT interventions to address impairments in aquatic and land-based setting to improve QOL and independence at home.    Rehab Potential  Fair    PT Frequency  3x / week    PT Duration  6 weeks    PT Treatment/Interventions  ADLs/Self Care Home Management;Aquatic Therapy;DME Instruction;Gait training;Functional mobility training;Stair training;Therapeutic activities;Therapeutic exercise;Balance training;Neuromuscular re-education;Patient/family education;Orthotic Fit/Training;Wheelchair mobility training;Manual techniques;Passive range of motion;Energy conservation;Dry needling;Splinting;Taping    PT Next Visit Plan  Follow up about plan for botox and neck pain/headache. Continue manual hip adductor stretch and hamstring stretch. Modify exercises as needed and resume standing exercises as able. Gait training with RW and wc follow, hurdle step over in // bars and step ups. Progress step up training as able in // bars. Consider progressing ambulation with obstacles.    PT Home Exercise Plan  08/16/18:  seated DF/PF, LAQ, marching, abd with theraband and bridge; 11/13: HS and calf stretching (says he has been performing this at home)    Consulted and Agree with Plan of Care  Patient       Patient will benefit  from skilled therapeutic intervention in order to improve the following deficits and impairments:  Abnormal gait, Decreased balance, Decreased endurance, Decreased mobility, Difficulty walking, Hypomobility, Decreased range of motion, Decreased activity tolerance, Decreased strength, Impaired flexibility  Visit Diagnosis: Other abnormalities of gait and mobility  Other symptoms and signs involving the musculoskeletal system  Muscle weakness (generalized)     Problem List Patient Active Problem List   Diagnosis Date Noted  . Pseudogout of knee   . Pyogenic arthritis of right knee joint ([place])   . Falls frequently 05/08/2018  . Fall   . Sleep disturbance   . Post-op pain   . Hypokalemia   . Morbid obesity ([place])   . Postoperative pain   . DVT, lower extremity, distal, acute, bilateral ([place])   . Acute blood loss anemia   . Anemia of chronic disease   . Essential hypertension   . Bacteremia   . Myelopathy ([place]) 03/30/2018  . Paraplegia ([place])   . Postlaminectomy syndrome, lumbar region   . Epidural abscess   . Abdominal distension   . Encephalopathy   . Weakness of both lower extremities   . Discitis of thoracic region   . Spinal cord stimulator status   . Staphylococcus aureus sepsis ([place]) 03/20/2018  . Bacteremia due to methicillin susceptible Staphylococcus aureus (MSSA) 03/18/2018  . Obesity 03/18/2018  . Nephrolithiasis 03/16/2018  . AKI (acute kidney injury) ([place]) 03/16/2018  . Cognitive impairment 03/16/2018  . Chronic pain  syndrome 03/16/2018  . B12 deficiency 01/28/2016  . Memory difficulty 07/23/2015  . History of cerebrovascular disease 07/23/2015  . FH: colon cancer 08/13/2014  . Hx of adenomatous colonic polyps 08/13/2014  . Chronic back pain 05/30/2012  . CAD, NATIVE VESSEL 07/17/2010  . HYPERCHOLESTEROLEMIA 10/31/2009  . SMOKELESS TOBACCO ABUSE 10/31/2009  . Obstructive sleep apnea 10/31/2009  . Essential hypertension, benign 10/31/2009  . GERD  10/31/2009     Kipp Brood, PT, DPT Physical Therapist with [facility]  12/04/2018 5:42 PM    [facility] [address] [place], [place], [address] Phone: [phone]   Fax:  [phone]  Name: Cody Velez MRN: [id number] Date of Birth: 1946/01/20

## 2018-12-06 ENCOUNTER — Ambulatory Visit (HOSPITAL_COMMUNITY): Payer: Medicare Other

## 2018-12-06 ENCOUNTER — Encounter (HOSPITAL_COMMUNITY): Payer: Self-pay

## 2018-12-06 DIAGNOSIS — R29898 Other symptoms and signs involving the musculoskeletal system: Secondary | ICD-10-CM | POA: Diagnosis not present

## 2018-12-06 DIAGNOSIS — M6281 Muscle weakness (generalized): Secondary | ICD-10-CM | POA: Diagnosis not present

## 2018-12-06 DIAGNOSIS — R2689 Other abnormalities of gait and mobility: Secondary | ICD-10-CM | POA: Diagnosis not present

## 2018-12-06 NOTE — Therapy (Signed)
[place], [place], [address] Phone: [phone]   Fax:  [phone]  Physical Therapy Treatment  Patient Details  Name: Cody Velez MRN: [id number] Date of Birth: 1946/03/31 Referring Provider (PT): Asencion Noble, MD   Encounter Date: 12/06/2018  PT End of Session - 12/06/18 1115    Visit Number  41    Number of Visits  62    Date for PT Re-Evaluation  01/03/19   Minireassess completed 11/03/18   Authorization Type  Primary: Medicare; Secondary: [employer] (75 visits per calendar year with 14 used at evaluation)    Authorization Time Period  08/15/18 - 10/14/18; NEW: 10/13/18 to 11/24/18; 11/22/18 - 01/03/19    Authorization - Visit Number  55   KX   Authorization - Number of Visits  75    PT Start Time  1115    PT Stop Time  1200    PT Time Calculation (min)  45 min    Equipment Utilized During Treatment  Gait belt    Activity Tolerance  Patient tolerated treatment well    Behavior During Therapy  WFL for tasks assessed/performed       Past Medical History:  Diagnosis Date  . Allergic rhinitis   . Arthritis   . B12 deficiency   . Cervicogenic headache 01/28/2016  . Childhood asthma   . Chronic back pain   . Coronary atherosclerosis of native coronary artery    Mild nonobstructive CAD at catheterization January 2015  . Depression   . Essential hypertension   . Falls   . GERD (gastroesophageal reflux disease)   . History of cerebrovascular disease 07/23/2015  . History of pneumonia 02/2011  . Hyperlipidemia   . Kidney stone   . Memory difficulty 07/23/2015  . OSA (obstructive sleep apnea)    CPAP - Dr. [name]  . Prostate cancer ([place])   . PTSD (post-traumatic stress disorder)    [place]  . Rectal bleeding     Past Surgical History:  Procedure Laterality Date  . APPLICATION OF ROBOTIC ASSISTANCE FOR SPINAL PROCEDURE N/A 03/28/2018   Procedure: APPLICATION OF ROBOTIC ASSISTANCE FOR SPINAL PROCEDURE;  Surgeon:  Kristeen Miss, MD;  Location: [facility];  Service: Neurosurgery;  Laterality: N/A;  . BACK SURGERY  02/14/12   lumbar OR #7; "today redid L1L2; replaced screws; added bone from hip"  . BILATERAL KNEE ARTHROSCOPY    . COLONOSCOPY  10/15/2008   Dr. [name]: tubular adenoma   . COLONOSCOPY  12/17/2003   [facility]:NTIRWE rectal and colon  . COLONOSCOPY N/A 09/05/2014   Procedure: COLONOSCOPY;  Surgeon: Daneil Dolin, MD;  [facility];  Service: Endoscopy;  Laterality: N/A;  7:30-rescheduled 9/17 to [place] notified pt  . CYSTOSCOPY WITH STENT PLACEMENT Right 01/27/2016   Procedure: CYSTOSCOPY WITH STENT PLACEMENT;  Surgeon: [name], MD;  Location: [facility];  Service: Urology;  Laterality: Right;  . CYSTOSCOPY/RETROGRADE/URETEROSCOPY/STONE EXTRACTION WITH BASKET Right 01/27/2016   Procedure: CYSTOSCOPY, RIGHT RETROGRADE, RIGHT URETEROSCOPY, STONE EXTRACTION ;  Surgeon: [name], MD;  Location: [facility];  Service: Urology;  Laterality: Right;  . ESOPHAGOGASTRODUODENOSCOPY  10/15/2008     Dr Rourk:Schatzki's ring status post dilation and disruption via [address] dilator/ otherwise unremarkable esophagus, small hiatal hernia, multiple fundal gland polyps not manipulated, gastritis, negative H.pylori  . ESOPHAGOGASTRODUODENOSCOPY  06/21/02   RXV:QMGQQ sliding hiatal hernia with mild changes of reflux esophagitis limited to gastroesophageal junction.  Noncritical ring at  distal esophagus, 3 cm proximal to gastroesophageal junction/Antral gastritis  . [place]   "broke face playing softball"  . FRACTURE SURGERY     "left wrist; broke it; took spur off"  . HOLMIUM LASER APPLICATION Right 05/22/9475   Procedure: HOLMIUM LASER APPLICATION;  Surgeon: [name], MD;  Location: [facility];  Service: Urology;  Laterality: Right;  . KNEE ARTHROSCOPY Right 05/18/2018   Procedure: PARTIAL MEDIAL MENISECTOMY AND SURGICAL LAVAGE AND CHONDROPLASTY;  Surgeon: [name], MD;  Location: [facility];  Service: Orthopedics;  Laterality: Right;  . LEFT HEART CATHETERIZATION WITH CORONARY ANGIOGRAM N/A 12/27/2013   Procedure: LEFT HEART CATHETERIZATION WITH CORONARY ANGIOGRAM;  Surgeon: [name], MD;  Location: [facility] CATH LAB;  Service: Cardiovascular;  Laterality: N/A;  . neck epidural    . POSTERIOR LUMBAR FUSION 4 LEVEL N/A 03/28/2018   Procedure: Thoracic eight -Lumbar two- FIXATION WITH SCREW PLACEMENT, DECOMPRESSION Thoracic ten-Thoracic eleven  FOR OSTEOMYELITIS;  Surgeon: Kristeen Miss, MD;  Location: [facility];  Service: Neurosurgery;  Laterality: N/A;  . SHOULDER SURGERY Bilateral   . TEE WITHOUT CARDIOVERSION N/A 03/21/2018   Procedure: TRANSESOPHAGEAL ECHOCARDIOGRAM (TEE) WITH PROPOFOL;  Surgeon: [name], MD;  [facility];  Service: Cardiovascular;  Laterality: N/A;    There were no vitals filed for this visit.  Subjective Assessment - 12/06/18 1209    Subjective  Pt states that he still has a HA. His BP was WNL at 145/91 in session.     Pertinent History  T10-11 Epidural Abscess, back surgery on 03/28/18, right knee partial medial menisectomy and chondroplasty    Limitations  Standing;Walking;House hold activities;Lifting    Diagnostic tests  CT Lumbar Spine 05/12/18: "Spinal stimulator device, along with multilevel multisegment fusion hardware appear appropriately placed without adverse features."    Patient Stated Goals  To walk    Currently in Pain?  Yes    Pain Score  3     Pain Location  Head    Pain Orientation  Right;Left    Pain Descriptors / Indicators  Aching;Headache    Pain Type  Acute pain    Pain Onset  1 to 4 weeks ago    Pain Frequency  Constant    Aggravating Factors   turning head seems to bother it    Pain Relieving Factors  percocet    Effect of Pain on Daily Activities  pushes through         [facility] Adult PT Treatment/Exercise - 12/06/18 0001      Ambulation/Gait   Ambulation/Gait  Yes     Ambulation/Gait Assistance  4: Min guard    Ambulation Distance (Feet)  144 Feet   43f, 362f+ hurdles, 5454fhurdles   Assistive device  Rolling walker    Gait Pattern  Decreased dorsiflexion - left;Decreased dorsiflexion - right;Trunk flexed;Decreased step length - right;Decreased step length - left;Step-through pattern;Scissoring    Gait Comments  2 6" hurdles inverted to be ~1" off the ground and having pt step over the obstacle leading with the R and the L foot       Balance Exercises - 12/06/18 1204      Balance Exercises: Standing   Tandem Stance  Eyes open;Intermittent upper extremity support;10 secs   +R/L head turns x10 reps each   Step Over Hurdles / Cones  fwd/retro step over inverted 6" hurdles (~1" height to step over) x5RT in // bars, BUE support  PT Short Term Goals - 11/22/18 1117      PT SHORT TERM GOAL #1   Title  Patient will report understanding and regular compliance with HEP to improve strength and overall functional mobility.     Baseline  11/15: Patient stated he has not been doing his exercises at home as often as he should.     Time  4    Period  Weeks    Status  Partially Met      PT SHORT TERM GOAL #2   Title  Patient will demonstrate improvement of 1/2 MMT strength grade in all muscle groups tested to assist patient with safe transfers, ambulation, and standing.     Baseline  11/15: see MMT    Time  4    Period  Weeks    Status  Partially Met      PT SHORT TERM GOAL #3   Title  Patient will report or demonstrate ability to perform ambulation for 2 minutes with LRAD with no more than supervision assistance without rest break and reported no more than minimal difficulty.     Baseline  11/15: amb 55sec and then 1:18 this date with RW and min-mod A due to L knee buckle and BLE weakness; last reassessment was 2:30 straight wiht RW    Time  4    Period  Weeks    Status  Achieved      PT SHORT TERM GOAL #4   Title  Patient will  demonstrate ability to ambulate 60 feet without rest break using LRAD with no more than minimal assistance.    Time  2    Period  Weeks    Status  Achieved        PT Long Term Goals - 11/22/18 1118      PT LONG TERM GOAL #1   Title  Patient will report or demonstrate ability to perform ambulation for 5 minutes with LRAD with no more than supervision assistance without rest break and reported no more than minimal difficulty.     Baseline  amb 2:45 straight with min guard and wc follow    Time  6    Period  Weeks    Status  On-going    Target Date  01/03/19      PT LONG TERM GOAL #2   Title  Patient will report ability to stand at a support surface for 10 minutes without a rest break in order to perform household chores with increased ease.     Baseline  4-5 minutes    Time  6    Period  Weeks    Status  On-going      PT LONG TERM GOAL #3   Title  Patient will demonstrate ability to ambulate 120 feet without rest break using LRAD with no more than minimal assistance.    Baseline  90 feet    Time  6    Period  Weeks    Status  On-going      PT LONG TERM GOAL #4   Title  Patient will perform 5x sit to stand from standard chair with good control and single UE assist in 16 seconds or less to demonstrate improved LE strength for functional transfers.    Time  6    Period  Weeks    Status  New            Plan - 12/06/18 1208    Clinical Impression Statement  Initiated  step over hurdles during gait today; inverted the 6" hurdle so that it was flat on the ground and ~1" obstacle for pt to step over. He did surprisingly well with this but did demo LLE coordination/proprioception deficits more so than the RLE as he had more difficulty appropriately placing it when stepping over. Continued with balance inside the // bars, which pt did well with. Pt making steady progress; continue as planned, progressing as able.    Rehab Potential  Fair    PT Frequency  3x / week    PT Duration   6 weeks    PT Treatment/Interventions  ADLs/Self Care Home Management;Aquatic Therapy;DME Instruction;Gait training;Functional mobility training;Stair training;Therapeutic activities;Therapeutic exercise;Balance training;Neuromuscular re-education;Patient/family education;Orthotic Fit/Training;Wheelchair mobility training;Manual techniques;Passive range of motion;Energy conservation;Dry needling;Splinting;Taping    PT Next Visit Plan  Follow up about plan for botox and neck pain/headache. Continue manual hip adductor stretch and hamstring stretch. Modify exercises as needed and resume standing exercises as able. Gait training with RW and wc follow, hurdle step over in // bars and step ups. Progress step up training as able in // bars. Consider progressing ambulation with obstacles.    PT Home Exercise Plan  08/16/18:  seated DF/PF, LAQ, marching, abd with theraband and bridge; 11/13: HS and calf stretching (says he has been performing this at home)    Consulted and Agree with Plan of Care  Patient       Patient will benefit from skilled therapeutic intervention in order to improve the following deficits and impairments:  Abnormal gait, Decreased balance, Decreased endurance, Decreased mobility, Difficulty walking, Hypomobility, Decreased range of motion, Decreased activity tolerance, Decreased strength, Impaired flexibility  Visit Diagnosis: Other abnormalities of gait and mobility  Other symptoms and signs involving the musculoskeletal system  Muscle weakness (generalized)     Problem List Patient Active Problem List   Diagnosis Date Noted  . Pseudogout of knee   . Pyogenic arthritis of right knee joint ([place])   . Falls frequently 05/08/2018  . Fall   . Sleep disturbance   . Post-op pain   . Hypokalemia   . Morbid obesity ([place])   . Postoperative pain   . DVT, lower extremity, distal, acute, bilateral ([place])   . Acute blood loss anemia   . Anemia of chronic disease   . Essential  hypertension   . Bacteremia   . Myelopathy ([place]) 03/30/2018  . Paraplegia ([place])   . Postlaminectomy syndrome, lumbar region   . Epidural abscess   . Abdominal distension   . Encephalopathy   . Weakness of both lower extremities   . Discitis of thoracic region   . Spinal cord stimulator status   . Staphylococcus aureus sepsis ([place]) 03/20/2018  . Bacteremia due to methicillin susceptible Staphylococcus aureus (MSSA) 03/18/2018  . Obesity 03/18/2018  . Nephrolithiasis 03/16/2018  . AKI (acute kidney injury) ([place]) 03/16/2018  . Cognitive impairment 03/16/2018  . Chronic pain syndrome 03/16/2018  . B12 deficiency 01/28/2016  . Memory difficulty 07/23/2015  . History of cerebrovascular disease 07/23/2015  . FH: colon cancer 08/13/2014  . Hx of adenomatous colonic polyps 08/13/2014  . Chronic back pain 05/30/2012  . CAD, NATIVE VESSEL 07/17/2010  . HYPERCHOLESTEROLEMIA 10/31/2009  . SMOKELESS TOBACCO ABUSE 10/31/2009  . Obstructive sleep apnea 10/31/2009  . Essential hypertension, benign 10/31/2009  . GERD 10/31/2009       Geraldine Solar PT, DPT  [facility] [address] [place], [place], [address] Phone: [phone]  Fax:  [phone]  Name: Cody Velez MRN: [id number] Date of Birth: November 01, 1946

## 2018-12-08 ENCOUNTER — Ambulatory Visit (HOSPITAL_COMMUNITY): Payer: Medicare Other

## 2018-12-08 ENCOUNTER — Encounter (HOSPITAL_COMMUNITY): Payer: Self-pay

## 2018-12-08 DIAGNOSIS — M6281 Muscle weakness (generalized): Secondary | ICD-10-CM

## 2018-12-08 DIAGNOSIS — R2689 Other abnormalities of gait and mobility: Secondary | ICD-10-CM | POA: Diagnosis not present

## 2018-12-08 DIAGNOSIS — R29898 Other symptoms and signs involving the musculoskeletal system: Secondary | ICD-10-CM

## 2018-12-08 NOTE — Therapy (Signed)
[place], [place], [address] Phone: [phone]   Fax:  [phone]  Physical Therapy Treatment  Patient Details  Name: Cody Velez MRN: [id number] Date of Birth: [birth year] Referring Provider (PT): Asencion Noble, MD   Encounter Date: 12/08/2018  PT End of Session - 12/08/18 1545    Visit Number  42    Number of Visits  69    Date for PT Re-Evaluation  01/03/19   Progress note complete 11/22/2018   Authorization Type  Primary: Medicare; Secondary: [employer] (75 visits per calendar year with 14 used at evaluation)    Authorization Time Period  08/15/18 - 10/14/18; NEW: 10/13/18 to 11/24/18; 11/22/18 - 01/03/19    Authorization - Visit Number  15   KX   Authorization - Number of Visits  75    PT Start Time  6962    PT Stop Time  1558    PT Time Calculation (min)  39 min    Equipment Utilized During Treatment  Gait belt    Activity Tolerance  Patient tolerated treatment well    Behavior During Therapy  WFL for tasks assessed/performed       Past Medical History:  Diagnosis Date  . Allergic rhinitis   . Arthritis   . B12 deficiency   . Cervicogenic headache 01/28/2016  . Childhood asthma   . Chronic back pain   . Coronary atherosclerosis of native coronary artery    Mild nonobstructive CAD at catheterization January 2015  . Depression   . Essential hypertension   . Falls   . GERD (gastroesophageal reflux disease)   . History of cerebrovascular disease 07/23/2015  . History of pneumonia 02/2011  . Hyperlipidemia   . Kidney stone   . Memory difficulty 07/23/2015  . OSA (obstructive sleep apnea)    CPAP - Dr. [name]  . Prostate cancer ([place])   . PTSD (post-traumatic stress disorder)    [place]  . Rectal bleeding     Past Surgical History:  Procedure Laterality Date  . APPLICATION OF ROBOTIC ASSISTANCE FOR SPINAL PROCEDURE N/A 03/28/2018   Procedure: APPLICATION OF ROBOTIC ASSISTANCE FOR SPINAL PROCEDURE;   Surgeon: Kristeen Miss, MD;  Location: [facility];  Service: Neurosurgery;  Laterality: N/A;  . BACK SURGERY  02/14/12   lumbar OR #7; "today redid L1L2; replaced screws; added bone from hip"  . BILATERAL KNEE ARTHROSCOPY    . COLONOSCOPY  10/15/2008   Dr. [name]: tubular adenoma   . COLONOSCOPY  12/17/2003   XBM:WUXLKG rectal and colon  . COLONOSCOPY N/A 09/05/2014   Procedure: COLONOSCOPY;  Surgeon: Daneil Dolin, MD;  [facility];  Service: Endoscopy;  Laterality: N/A;  7:30-rescheduled 9/17 to [place] notified pt  . CYSTOSCOPY WITH STENT PLACEMENT Right 01/27/2016   Procedure: CYSTOSCOPY WITH STENT PLACEMENT;  Surgeon: [name], MD;  [facility]: [facility];  Service: Urology;  Laterality: Right;  . CYSTOSCOPY/RETROGRADE/URETEROSCOPY/STONE EXTRACTION WITH BASKET Right 01/27/2016   Procedure: CYSTOSCOPY, RIGHT RETROGRADE, RIGHT URETEROSCOPY, STONE EXTRACTION ;  Surgeon: [name], MD;  Location: [facility];  Service: Urology;  Laterality: Right;  . ESOPHAGOGASTRODUODENOSCOPY  10/15/2008     Dr Rourk:Schatzki's ring status post dilation and disruption via 11 F Maloney dilator/ otherwise unremarkable esophagus, small hiatal hernia, multiple fundal gland polyps not manipulated, gastritis, negative H.pylori  . ESOPHAGOGASTRODUODENOSCOPY  06/21/02   MWN:UUVOZ sliding hiatal hernia with mild changes of reflux esophagitis limited to gastroesophageal junction.  Noncritical ring  at distal esophagus, 3 cm proximal to gastroesophageal junction/Antral gastritis  . [place]   "broke face playing softball"  . FRACTURE SURGERY     "left wrist; broke it; took spur off"  . HOLMIUM LASER APPLICATION Right 08/27/3531   Procedure: HOLMIUM LASER APPLICATION;  Surgeon: [name], MD;  [facility]: [facility];  Service: Urology;  Laterality: Right;  . KNEE ARTHROSCOPY Right 05/18/2018   Procedure: PARTIAL MEDIAL MENISECTOMY AND SURGICAL LAVAGE AND CHONDROPLASTY;  Surgeon:  [name], MD;  Location: [facility];  Service: Orthopedics;  Laterality: Right;  . LEFT HEART CATHETERIZATION WITH CORONARY ANGIOGRAM N/A 12/27/2013   Procedure: LEFT HEART CATHETERIZATION WITH CORONARY ANGIOGRAM;  Surgeon: [name], MD;  Location: [facility];  Service: Cardiovascular;  Laterality: N/A;  . neck epidural    . POSTERIOR LUMBAR FUSION 4 LEVEL N/A 03/28/2018   Procedure: Thoracic eight -Lumbar two- FIXATION WITH SCREW PLACEMENT, DECOMPRESSION Thoracic ten-Thoracic eleven  FOR OSTEOMYELITIS;  Surgeon: Kristeen Miss, MD;  Location: [facility];  Service: Neurosurgery;  Laterality: N/A;  . SHOULDER SURGERY Bilateral   . TEE WITHOUT CARDIOVERSION N/A 03/21/2018   Procedure: TRANSESOPHAGEAL ECHOCARDIOGRAM (TEE) WITH PROPOFOL;  Surgeon: [name], MD;  [facility];  Service: Cardiovascular;  Laterality: N/A;    There were no vitals filed for this visit.  Subjective Assessment - 12/08/18 1544    Subjective  Pt stated he is feeling better with headache and overall neck pain, 3/10    Diagnostic tests  CT Lumbar Spine 05/12/18: "Spinal stimulator device, along with multilevel multisegment fusion hardware appear appropriately placed without adverse features."    Patient Stated Goals  To walk    Currently in Pain?  Yes    Pain Score  3     Pain Location  Head    Pain Orientation  Posterior;Left;Right    Pain Descriptors / Indicators  Aching;Headache    Pain Type  Acute pain    Pain Onset  1 to 4 weeks ago    Pain Frequency  Constant    Aggravating Factors   Turning head seems to bother it    Pain Relieving Factors  percocet    Effect of Pain on Daily Activities  pushes through                       [facility] Adult PT Treatment/Exercise - 12/08/18 0001      Ambulation/Gait   Ambulation/Gait  Yes    Ambulation/Gait Assistance  4: Min guard    Ambulation Distance (Feet)  180 Feet   82' with 4 hurdles then 177f with 4 hurdles   Assistive device  Rolling  walker    Gait Pattern  Decreased dorsiflexion - left;Decreased dorsiflexion - right;Trunk flexed;Decreased step length - right;Decreased step length - left;Step-through pattern;Scissoring    Gait Comments  2 6" hurdles inverted to be ~1" off the ground and having pt step over the obstacle leading with the R and the L foot       Passive stretches to hamstrings and adductor 3x 30"    Balance Exercises - 12/08/18 1606      Balance Exercises: Standing   Tandem Stance  Eyes open;3 reps;Time;Intermittent upper extremity support   partial tandem stance x 1' each wiht 10 head turns   Step Over Hurdles / Cones  fwd/retro step over inverted 6" hurdles (~1" height to step over) x5RT in // bars, BUE support  PT Short Term Goals - 11/22/18 1117      PT SHORT TERM GOAL #1   Title  Patient will report understanding and regular compliance with HEP to improve strength and overall functional mobility.     Baseline  11/15: Patient stated he has not been doing his exercises at home as often as he should.     Time  4    Period  Weeks    Status  Partially Met      PT SHORT TERM GOAL #2   Title  Patient will demonstrate improvement of 1/2 MMT strength grade in all muscle groups tested to assist patient with safe transfers, ambulation, and standing.     Baseline  11/15: see MMT    Time  4    Period  Weeks    Status  Partially Met      PT SHORT TERM GOAL #3   Title  Patient will report or demonstrate ability to perform ambulation for 2 minutes with LRAD with no more than supervision assistance without rest break and reported no more than minimal difficulty.     Baseline  11/15: amb 55sec and then 1:18 this date with RW and min-mod A due to L knee buckle and BLE weakness; last reassessment was 2:30 straight wiht RW    Time  4    Period  Weeks    Status  Achieved      PT SHORT TERM GOAL #4   Title  Patient will demonstrate ability to ambulate 60 feet without rest break using LRAD with no  more than minimal assistance.    Time  2    Period  Weeks    Status  Achieved        PT Long Term Goals - 11/22/18 1118      PT LONG TERM GOAL #1   Title  Patient will report or demonstrate ability to perform ambulation for 5 minutes with LRAD with no more than supervision assistance without rest break and reported no more than minimal difficulty.     Baseline  amb 2:45 straight with min guard and wc follow    Time  6    Period  Weeks    Status  On-going    Target Date  01/03/19      PT LONG TERM GOAL #2   Title  Patient will report ability to stand at a support surface for 10 minutes without a rest break in order to perform household chores with increased ease.     Baseline  4-5 minutes    Time  6    Period  Weeks    Status  On-going      PT LONG TERM GOAL #3   Title  Patient will demonstrate ability to ambulate 120 feet without rest break using LRAD with no more than minimal assistance.    Baseline  90 feet    Time  6    Period  Weeks    Status  On-going      PT LONG TERM GOAL #4   Title  Patient will perform 5x sit to stand from standard chair with good control and single UE assist in 16 seconds or less to demonstrate improved LE strength for functional transfers.    Time  6    Period  Weeks    Status  New            Plan - 12/08/18 1607    Clinical Impression Statement  Continued  gait training with inverted 6" hurdles around ~1" obstacles, pt demosntrated improved activity tolerance with ability to ambulate 159f with only one seated rest break.  Continued wiht static balance activities with min A for safety wiht LOB.  Manual stretches complete to hamstrings and adductors to resolve LE tightness for mobility.  No reports of pain through session, noted visible muscle fatigue wiht activities.      Rehab Potential  Fair    Clinical Impairments Affecting Rehab Potential  Positive: Highly motivated; Negative: Comorbidities    PT Frequency  3x / week    PT Duration  6  weeks    PT Treatment/Interventions  ADLs/Self Care Home Management;Aquatic Therapy;DME Instruction;Gait training;Functional mobility training;Stair training;Therapeutic activities;Therapeutic exercise;Balance training;Neuromuscular re-education;Patient/family education;Orthotic Fit/Training;Wheelchair mobility training;Manual techniques;Passive range of motion;Energy conservation;Dry needling;Splinting;Taping    PT Next Visit Plan  Follow up about plan for botox and neck pain/headache. Continue manual hip adductor stretch and hamstring stretch. Modify exercises as needed and resume standing exercises as able. Gait training with RW and wc follow, hurdle step over in // bars and step ups. Progress step up training as able in // bars. Consider progressing ambulation with obstacles.    PT Home Exercise Plan  08/16/18:  seated DF/PF, LAQ, marching, abd with theraband and bridge; 11/13: HS and calf stretching (says he has been performing this at home)       Patient will benefit from skilled therapeutic intervention in order to improve the following deficits and impairments:  Abnormal gait, Decreased balance, Decreased endurance, Decreased mobility, Difficulty walking, Hypomobility, Decreased range of motion, Decreased activity tolerance, Decreased strength, Impaired flexibility  Visit Diagnosis: Other abnormalities of gait and mobility  Other symptoms and signs involving the musculoskeletal system  Muscle weakness (generalized)     Problem List Patient Active Problem List   Diagnosis Date Noted  . Pseudogout of knee   . Pyogenic arthritis of right knee joint ([place]   . Falls frequently 05/08/2018  . Fall   . Sleep disturbance   . Post-op pain   . Hypokalemia   . Morbid obesity ([place]   . Postoperative pain   . DVT, lower extremity, distal, acute, bilateral ([place]   . Acute blood loss anemia   . Anemia of chronic disease   . Essential hypertension   . Bacteremia   . Myelopathy ([place]  03/30/2018  . Paraplegia ([place]   . Postlaminectomy syndrome, lumbar region   . Epidural abscess   . Abdominal distension   . Encephalopathy   . Weakness of both lower extremities   . Discitis of thoracic region   . Spinal cord stimulator status   . Staphylococcus aureus sepsis ([place] 03/20/2018  . Bacteremia due to methicillin susceptible Staphylococcus aureus (MSSA) 03/18/2018  . Obesity 03/18/2018  . Nephrolithiasis 03/16/2018  . AKI (acute kidney injury) ([place] 03/16/2018  . Cognitive impairment 03/16/2018  . Chronic pain syndrome 03/16/2018  . B12 deficiency 01/28/2016  . Memory difficulty 07/23/2015  . History of cerebrovascular disease 07/23/2015  . FH: colon cancer 08/13/2014  . Hx of adenomatous colonic polyps 08/13/2014  . Chronic back pain 05/30/2012  . CAD, NATIVE VESSEL 07/17/2010  . HYPERCHOLESTEROLEMIA 10/31/2009  . SMOKELESS TOBACCO ABUSE 10/31/2009  . Obstructive sleep apnea 10/31/2009  . Essential hypertension, benign 10/31/2009  . GERD 10/31/2009   CIhor Austin [place] CAldona Lento12/20/2019, 5:06 PM  [place] [address]Phone: [phone]  Fax:  [phone]  Name: Cody Velez MRN: [id number] Date of Birth: 1946/10/20

## 2018-12-11 ENCOUNTER — Encounter (HOSPITAL_COMMUNITY): Payer: Self-pay

## 2018-12-11 ENCOUNTER — Ambulatory Visit (HOSPITAL_COMMUNITY): Payer: Medicare Other

## 2018-12-11 ENCOUNTER — Other Ambulatory Visit: Payer: Self-pay

## 2018-12-11 DIAGNOSIS — R29898 Other symptoms and signs involving the musculoskeletal system: Secondary | ICD-10-CM | POA: Diagnosis not present

## 2018-12-11 DIAGNOSIS — R2689 Other abnormalities of gait and mobility: Secondary | ICD-10-CM | POA: Diagnosis not present

## 2018-12-11 DIAGNOSIS — M6281 Muscle weakness (generalized): Secondary | ICD-10-CM | POA: Diagnosis not present

## 2018-12-11 NOTE — Therapy (Signed)
[place], [place], [address] Phone: [phone]   Fax:  [phone]  Physical Therapy Aquatic Treatment  Patient Details  Name: Cody Velez MRN: [id number] Date of Birth: 1946/12/14 Referring Provider (PT): Asencion Noble, MD   Encounter Date: 12/11/2018  PT End of Session - 12/11/18 1641    Visit Number  43    Number of Visits  20    Date for PT Re-Evaluation  01/03/19   Progress note complete 11/22/2018   Authorization Type  Primary: Medicare; Secondary: [employer] (75 visits per calendar year with 14 used at evaluation)    Authorization Time Period  08/15/18 - 10/14/18; NEW: 10/13/18 to 11/24/18; 11/22/18 - 01/03/19    Authorization - Visit Number  36   KX   Authorization - Number of Visits  75    PT Start Time  1340    PT Stop Time  1428    PT Time Calculation (min)  48 min    Equipment Utilized During Treatment  Gait belt    Activity Tolerance  Patient tolerated treatment well    Behavior During Therapy  WFL for tasks assessed/performed       Past Medical History:  Diagnosis Date  . Allergic rhinitis   . Arthritis   . B12 deficiency   . Cervicogenic headache 01/28/2016  . Childhood asthma   . Chronic back pain   . Coronary atherosclerosis of native coronary artery    Mild nonobstructive CAD at catheterization January 2015  . Depression   . Essential hypertension   . Falls   . GERD (gastroesophageal reflux disease)   . History of cerebrovascular disease 07/23/2015  . History of pneumonia 02/2011  . Hyperlipidemia   . Kidney stone   . Memory difficulty 07/23/2015  . OSA (obstructive sleep apnea)    CPAP - Dr. [name]  . Prostate cancer ([place])   . PTSD (post-traumatic stress disorder)    [place]  . Rectal bleeding     Past Surgical History:  Procedure Laterality Date  . APPLICATION OF ROBOTIC ASSISTANCE FOR SPINAL PROCEDURE N/A 03/28/2018   Procedure: APPLICATION OF ROBOTIC ASSISTANCE FOR SPINAL PROCEDURE;   Surgeon: Kristeen Miss, MD;  Location: [facility] [facility];  Service: Neurosurgery;  Laterality: N/A;  . BACK SURGERY  02/14/12   lumbar OR #7; "today redid L1L2; replaced screws; added bone from hip"  . BILATERAL KNEE ARTHROSCOPY    . COLONOSCOPY  10/15/2008   Dr. [name]: tubular adenoma   . COLONOSCOPY  12/17/2003   NID:[id number] rectal and colon  . COLONOSCOPY N/A 09/05/2014   Procedure: COLONOSCOPY;  Surgeon: Daneil Dolin, MD;  [facility];  Service: Endoscopy;  Laterality: N/A;  7:30-rescheduled 9/17 to [place] notified pt  . CYSTOSCOPY WITH STENT PLACEMENT Right 01/27/2016   Procedure: CYSTOSCOPY WITH STENT PLACEMENT;  Surgeon: [name], MD;  Location: [facility];  Service: Urology;  Laterality: Right;  . CYSTOSCOPY/RETROGRADE/URETEROSCOPY/STONE EXTRACTION WITH BASKET Right 01/27/2016   Procedure: CYSTOSCOPY, RIGHT RETROGRADE, RIGHT URETEROSCOPY, STONE EXTRACTION ;  Surgeon: [name], MD;  Location: [facility];  Service: Urology;  Laterality: Right;  . ESOPHAGOGASTRODUODENOSCOPY  10/15/2008     Dr Rourk:Schatzki's ring status post dilation and disruption via 36 F Maloney dilator/ otherwise unremarkable esophagus, small hiatal hernia, multiple fundal gland polyps not manipulated, gastritis, negative H.pylori  . ESOPHAGOGASTRODUODENOSCOPY  06/21/02   NTI:RWERX sliding hiatal hernia with mild changes of reflux esophagitis limited to gastroesophageal junction.  Noncritical  ring at distal esophagus, 3 cm proximal to gastroesophageal junction/Antral gastritis  . [place]   "broke face playing softball"  . FRACTURE SURGERY     "left wrist; broke it; took spur off"  . HOLMIUM LASER APPLICATION Right 02/24/9023   Procedure: HOLMIUM LASER APPLICATION;  Surgeon: [name], MD;  Location: [facility];  Service: Urology;  Laterality: Right;  . KNEE ARTHROSCOPY Right 05/18/2018   Procedure: PARTIAL MEDIAL MENISECTOMY AND SURGICAL LAVAGE AND CHONDROPLASTY;  Surgeon:  [name], MD;  Location: [facility];  Service: Orthopedics;  Laterality: Right;  . LEFT HEART CATHETERIZATION WITH CORONARY ANGIOGRAM N/A 12/27/2013   Procedure: LEFT HEART CATHETERIZATION WITH CORONARY ANGIOGRAM;  Surgeon: [name], MD;  Location[facility];  Service: Cardiovascular;  Laterality: N/A;  . neck epidural    . POSTERIOR LUMBAR FUSION 4 LEVEL N/A 03/28/2018   Procedure: Thoracic eight -Lumbar two- FIXATION WITH SCREW PLACEMENT, DECOMPRESSION Thoracic ten-Thoracic eleven  FOR OSTEOMYELITIS;  Surgeon: Kristeen Miss, MD;  Location: [facility];  Service: Neurosurgery;  Laterality: N/A;  . SHOULDER SURGERY Bilateral   . TEE WITHOUT CARDIOVERSION N/A 03/21/2018   Procedure: TRANSESOPHAGEAL ECHOCARDIOGRAM (TEE) WITH PROPOFOL;  Surgeon: [name], MD;  [facility];  Service: Cardiovascular;  Laterality: N/A;    There were no vitals filed for this visit.  Subjective Assessment - 12/11/18 1634    Subjective  Patient arrives with his daughter and 2 grandsons today and reports he is doing well. He denies pain today. He reports that in the water he notices his calf muscles feel tight after walking a lot.    Diagnostic tests  CT Lumbar Spine 05/12/18: "Spinal stimulator device, along with multilevel multisegment fusion hardware appear appropriately placed without adverse features."    Patient Stated Goals  To walk    Currently in Pain?  No/denies      Transfers:  Squat pivot transfer performed 2xwith set up assistance and Mod I levelfrom manual wheelchair to pool chair lift. Patient utilized pool chair lift to get in/out of pool for therapy session  Adult Aquatic Therapy - 12/11/18 1635      Aquatic Therapy Subjective   Subjective  Patient arrives with his daughter and 2 grandsons today and reports he is doing well. He denies pain today. He reports that in the water he notices his calf muscles feel tight after walking a lot.      Treatment   Gait  3x 20'  forward/backward gait as walking warmup. Patient with decreased scissoring this session. No ankle weights today.    Exercises  10 reps forward step up on platform, bil LE, 2 HHA on bars into no UE support with standing. 3x 30 seconds hamstring stretch on platform, bil LE. 1x 20 reps squat with heel raises, with bil UE support at wall. 1x 15 reps, bil LE, noodle press with single UE support.    Balance  "Golf/hockey" activity with maintaining squat starting position. Patient challenged with sweeping/picking up sinkable rings with hockey stick by performing hockey/golf swing while holding balance. 10 reps.        PT Education - 12/11/18 1641    Education Details  Educated on importance of stretching LE's every day to improve gait quality and reduce LBP.    Person(s) Educated  Patient    Methods  Explanation    Comprehension  Verbalized understanding       PT Short Term Goals - 11/22/18 1117  PT SHORT TERM GOAL #1   Title  Patient will report understanding and regular compliance with HEP to improve strength and overall functional mobility.     Baseline  11/15: Patient stated he has not been doing his exercises at home as often as he should.     Time  4    Period  Weeks    Status  Partially Met      PT SHORT TERM GOAL #2   Title  Patient will demonstrate improvement of 1/2 MMT strength grade in all muscle groups tested to assist patient with safe transfers, ambulation, and standing.     Baseline  11/15: see MMT    Time  4    Period  Weeks    Status  Partially Met      PT SHORT TERM GOAL #3   Title  Patient will report or demonstrate ability to perform ambulation for 2 minutes with LRAD with no more than supervision assistance without rest break and reported no more than minimal difficulty.     Baseline  11/15: amb 55sec and then 1:18 this date with RW and min-mod A due to L knee buckle and BLE weakness; last reassessment was 2:30 straight wiht RW    Time  4    Period  Weeks     Status  Achieved      PT SHORT TERM GOAL #4   Title  Patient will demonstrate ability to ambulate 60 feet without rest break using LRAD with no more than minimal assistance.    Time  2    Period  Weeks    Status  Achieved        PT Long Term Goals - 11/22/18 1118      PT LONG TERM GOAL #1   Title  Patient will report or demonstrate ability to perform ambulation for 5 minutes with LRAD with no more than supervision assistance without rest break and reported no more than minimal difficulty.     Baseline  amb 2:45 straight with min guard and wc follow    Time  6    Period  Weeks    Status  On-going    Target Date  01/03/19      PT LONG TERM GOAL #2   Title  Patient will report ability to stand at a support surface for 10 minutes without a rest break in order to perform household chores with increased ease.     Baseline  4-5 minutes    Time  6    Period  Weeks    Status  On-going      PT LONG TERM GOAL #3   Title  Patient will demonstrate ability to ambulate 120 feet without rest break using LRAD with no more than minimal assistance.    Baseline  90 feet    Time  6    Period  Weeks    Status  On-going      PT LONG TERM GOAL #4   Title  Patient will perform 5x sit to stand from standard chair with good control and single UE assist in 16 seconds or less to demonstrate improved LE strength for functional transfers.    Time  6    Period  Weeks    Status  New         Plan - 12/11/18 1642    Clinical Impression Statement  Aquatic therapy session performed today and session began with focus on strengthening and transitioned to balance/proprioception  training. Patient performed step ups on platform and demonstrated improved strength with no knee buckling during step up and only 1x maintaining standing on platform. He demonstrated improved gait quality today with no scissoring and continued with balance training using hockey stick for extra resistance during "golf/hockey" activity.  He demonstrated improved ability to stabilize balance prior to swinging to pick up the rings and had only 2 episodes of LOB requiring assistance. He continues to primarily lose balance posteriorly. He required multiple steps to regain balance throughout. He will continue to benefit from skilled PT interventions to address impairments in aquatic and land-based setting to improve QOL and independence at home.    Rehab Potential  Fair    Clinical Impairments Affecting Rehab Potential  Positive: Highly motivated; Negative: Comorbidities    PT Frequency  3x / week    PT Duration  6 weeks    PT Treatment/Interventions  ADLs/Self Care Home Management;Aquatic Therapy;DME Instruction;Gait training;Functional mobility training;Stair training;Therapeutic activities;Therapeutic exercise;Balance training;Neuromuscular re-education;Patient/family education;Orthotic Fit/Training;Wheelchair mobility training;Manual techniques;Passive range of motion;Energy conservation;Dry needling;Splinting;Taping    PT Next Visit Plan  Follow up about plan for botox and neck pain/headache. Continue manual hip adductor stretch and hamstring stretch. Modify exercises as needed and resume standing exercises as able. Gait training with RW and wc follow, hurdle step over in // bars and step ups. Progress step up training as able in // bars. Consider progressing ambulation with obstacles.    PT Home Exercise Plan  08/16/18:  seated DF/PF, LAQ, marching, abd with theraband and bridge; 11/13: HS and calf stretching (says he has been performing this at home)    Consulted and Agree with Plan of Care  Patient       Patient will benefit from skilled therapeutic intervention in order to improve the following deficits and impairments:  Abnormal gait, Decreased balance, Decreased endurance, Decreased mobility, Difficulty walking, Hypomobility, Decreased range of motion, Decreased activity tolerance, Decreased strength, Impaired flexibility  Visit  Diagnosis: Other abnormalities of gait and mobility  Other symptoms and signs involving the musculoskeletal system  Muscle weakness (generalized)     Problem List Patient Active Problem List   Diagnosis Date Noted  . Pseudogout of knee   . Pyogenic arthritis of right knee joint ([place])   . Falls frequently 05/08/2018  . Fall   . Sleep disturbance   . Post-op pain   . Hypokalemia   . Morbid obesity ([place])   . Postoperative pain   . DVT, lower extremity, distal, acute, bilateral ([place])   . Acute blood loss anemia   . Anemia of chronic disease   . Essential hypertension   . Bacteremia   . Myelopathy ([place]) 03/30/2018  . Paraplegia ([place])   . Postlaminectomy syndrome, lumbar region   . Epidural abscess   . Abdominal distension   . Encephalopathy   . Weakness of both lower extremities   . Discitis of thoracic region   . Spinal cord stimulator status   . Staphylococcus aureus sepsis ([place]) 03/20/2018  . Bacteremia due to methicillin susceptible Staphylococcus aureus (MSSA) 03/18/2018  . Obesity 03/18/2018  . Nephrolithiasis 03/16/2018  . AKI (acute kidney injury) ([place]) 03/16/2018  . Cognitive impairment 03/16/2018  . Chronic pain syndrome 03/16/2018  . B12 deficiency 01/28/2016  . Memory difficulty 07/23/2015  . History of cerebrovascular disease 07/23/2015  . FH: colon cancer 08/13/2014  . Hx of adenomatous colonic polyps 08/13/2014  . Chronic back pain 05/30/2012  . CAD, NATIVE VESSEL 07/17/2010  . HYPERCHOLESTEROLEMIA 10/31/2009  .  SMOKELESS TOBACCO ABUSE 10/31/2009  . Obstructive sleep apnea 10/31/2009  . Essential hypertension, benign 10/31/2009  . GERD 10/31/2009    Kipp Brood, PT, DPT Physical Therapist with [facility]  12/11/2018 4:43 PM    [place], [place], [address] Phone: [phone]   Fax:  [phone]  Name: ELADIO DENTREMONT MRN: [id number] Date of  Birth: 08-19-1946

## 2018-12-14 ENCOUNTER — Ambulatory Visit (HOSPITAL_COMMUNITY): Payer: Medicare Other

## 2018-12-14 ENCOUNTER — Encounter (HOSPITAL_COMMUNITY): Payer: Self-pay

## 2018-12-14 ENCOUNTER — Other Ambulatory Visit: Payer: Self-pay

## 2018-12-14 DIAGNOSIS — R29898 Other symptoms and signs involving the musculoskeletal system: Secondary | ICD-10-CM

## 2018-12-14 DIAGNOSIS — R2689 Other abnormalities of gait and mobility: Secondary | ICD-10-CM | POA: Diagnosis not present

## 2018-12-14 DIAGNOSIS — M6281 Muscle weakness (generalized): Secondary | ICD-10-CM | POA: Diagnosis not present

## 2018-12-14 NOTE — Therapy (Addendum)
[facility] [address] [place], [place], [address] Phone: [phone]   Fax:  [phone]  Physical Therapy Treatment/Progress Note  Patient Details  Name: Cody Velez MRN: [id number] Date of Birth: 01-02-1946 Referring Provider (PT): Asencion Noble, MD   Encounter Date: 12/14/2018   Progress Note Reporting Period 11/22/2018 to 12/14/2018  See note below for Objective Data and Assessment of Progress/Goals.     PT End of Session - 12/14/18 1214    Visit Number  44    Number of Visits  61    Date for PT Re-Evaluation  01/03/19   Progress note complete 11/22/2018   Authorization Type  Primary: Medicare; Secondary: [employer] (75 visits per calendar year with 14 used at evaluation)    Authorization Time Period  08/15/18 - 10/14/18; NEW: 10/13/18 to 11/24/18; 11/22/18 - 01/03/19    Authorization - Visit Number  37   KX   Authorization - Number of Visits  75    PT Start Time  1119    PT Stop Time  1207    PT Time Calculation (min)  48 min    Equipment Utilized During Treatment  Gait belt    Activity Tolerance  Patient tolerated treatment well    Behavior During Therapy  WFL for tasks assessed/performed       Past Medical History:  Diagnosis Date  . Allergic rhinitis   . Arthritis   . B12 deficiency   . Cervicogenic headache 01/28/2016  . Childhood asthma   . Chronic back pain   . Coronary atherosclerosis of native coronary artery    Mild nonobstructive CAD at catheterization January 2015  . Depression   . Essential hypertension   . Falls   . GERD (gastroesophageal reflux disease)   . History of cerebrovascular disease 07/23/2015  . History of pneumonia 02/2011  . Hyperlipidemia   . Kidney stone   . Memory difficulty 07/23/2015  . OSA (obstructive sleep apnea)    CPAP - Dr. [name]  . Prostate cancer ([place])   . PTSD (post-traumatic stress disorder)    [place]  . Rectal bleeding     Past Surgical History:  Procedure Laterality Date   . APPLICATION OF ROBOTIC ASSISTANCE FOR SPINAL PROCEDURE N/A 03/28/2018   Procedure: APPLICATION OF ROBOTIC ASSISTANCE FOR SPINAL PROCEDURE;  Surgeon: Kristeen Miss, MD;  Location: [facility];  Service: Neurosurgery;  Laterality: N/A;  . BACK SURGERY  02/14/12   lumbar OR #7; "today redid L1L2; replaced screws; added bone from hip"  . BILATERAL KNEE ARTHROSCOPY    . COLONOSCOPY  10/15/2008   Dr. [name]: tubular adenoma   . COLONOSCOPY  12/17/2003   ZJI:RCVELF rectal and colon  . COLONOSCOPY N/A 09/05/2014   Procedure: COLONOSCOPY;  Surgeon: Daneil Dolin, MD;  [facility];  Service: Endoscopy;  Laterality: N/A;  7:30-rescheduled 9/17 to [place] notified pt  . CYSTOSCOPY WITH STENT PLACEMENT Right 01/27/2016   Procedure: CYSTOSCOPY WITH STENT PLACEMENT;  Surgeon: [name], MD;  Location: [facility];  Service: Urology;  Laterality: Right;  . CYSTOSCOPY/RETROGRADE/URETEROSCOPY/STONE EXTRACTION WITH BASKET Right 01/27/2016   Procedure: CYSTOSCOPY, RIGHT RETROGRADE, RIGHT URETEROSCOPY, STONE EXTRACTION ;  Surgeon: [name], MD;  Location: [facility];  Service: Urology;  Laterality: Right;  . ESOPHAGOGASTRODUODENOSCOPY  10/15/2008     Dr Rourk:Schatzki's ring status post dilation and disruption via 60 F Maloney dilator/ otherwise unremarkable esophagus, small hiatal hernia, multiple fundal gland polyps not manipulated, gastritis, negative H.pylori  .  ESOPHAGOGASTRODUODENOSCOPY  06/21/02   NGE:XBMWU sliding hiatal hernia with mild changes of reflux esophagitis limited to gastroesophageal junction.  Noncritical ring at distal esophagus, 3 cm proximal to gastroesophageal junction/Antral gastritis  . [place]   "broke face playing softball"  . FRACTURE SURGERY     "left wrist; broke it; took spur off"  . HOLMIUM LASER APPLICATION Right 12/22/2438   Procedure: HOLMIUM LASER APPLICATION;  Surgeon: [name], MD;  Location: [facility];  Service: Urology;   Laterality: Right;  . KNEE ARTHROSCOPY Right 05/18/2018   Procedure: PARTIAL MEDIAL MENISECTOMY AND SURGICAL LAVAGE AND CHONDROPLASTY;  Surgeon: [name], MD;  Location: [facility];  Service: Orthopedics;  Laterality: Right;  . LEFT HEART CATHETERIZATION WITH CORONARY ANGIOGRAM N/A 12/27/2013   Procedure: LEFT HEART CATHETERIZATION WITH CORONARY ANGIOGRAM;  Surgeon: [name], MD;  Location: [facility];  Service: Cardiovascular;  Laterality: N/A;  . neck epidural    . POSTERIOR LUMBAR FUSION 4 LEVEL N/A 03/28/2018   Procedure: Thoracic eight -Lumbar two- FIXATION WITH SCREW PLACEMENT, DECOMPRESSION Thoracic ten-Thoracic eleven  FOR OSTEOMYELITIS;  Surgeon: Kristeen Miss, MD;  Location: [facility];  Service: Neurosurgery;  Laterality: N/A;  . SHOULDER SURGERY Bilateral   . TEE WITHOUT CARDIOVERSION N/A 03/21/2018   Procedure: TRANSESOPHAGEAL ECHOCARDIOGRAM (TEE) WITH PROPOFOL;  Surgeon: [name], MD;  [facility];  Service: Cardiovascular;  Laterality: N/A;    There were no vitals filed for this visit.  Subjective Assessment - 12/14/18 1213    Subjective  Patient arrives with his granddaughter "[name]" and reports he wasn't feeling great this morning, but after he had breakfast he felt better. He denies pain today and reports his headaches are getting better too.    Pertinent History  T10-11 Epidural Abscess, back surgery on 03/28/18, right knee partial medial menisectomy and chondroplasty    Diagnostic tests  CT Lumbar Spine 05/12/18: "Spinal stimulator device, along with multilevel multisegment fusion hardware appear appropriately placed without adverse features."    Patient Stated Goals  To walk    Currently in Pain?  No/denies        [facility] Adult PT Treatment/Exercise - 12/14/18 0001      Ambulation/Gait   Ambulation/Gait  Yes    Ambulation/Gait Assistance  4: Min guard    Ambulation/Gait Assistance Details  RW and w/c follow for safety    Ambulation Distance (Feet)  200  Feet   3 bouts   Assistive device  Rolling walker    Gait Pattern  Decreased dorsiflexion - left;Decreased dorsiflexion - right;Trunk flexed;Decreased step length - right;Decreased step length - left;Step-through pattern;Scissoring      Knee/Hip Exercises: Stretches   Active Hamstring Stretch  Left;2 reps;30 seconds;Limitations    Active Hamstring Stretch Limitations  supine with rope, performed by patient    Passive Hamstring Stretch  Both;3 reps;30 seconds    Passive Hamstring Stretch Limitations  Lt 1 rep, Rt 3 reps, 30 seconds, manual overpressure in supine fromt herapist      Knee/Hip Exercises: Standing   Forward Step Up  Both;Hand Hold: 2;1 set;10 reps;Step Height: 2"    Other Standing Knee Exercises  Hurdle negotiation/gait training: 2x RT fwd/bkwd 4 (1") hurdles in // bars, 2x RT lateral stepping  4 (1") hurdles in // bars.        PT Education - 12/14/18 1213    Education Details  Educated on importance of stretching and provided updated handout on additional hamstring stretch.  Person(s) Educated  Patient    Methods  Explanation;Handout    Comprehension  Verbalized understanding;Returned demonstration       PT Short Term Goals - 11/22/18 1117      PT SHORT TERM GOAL #1   Title  Patient will report understanding and regular compliance with HEP to improve strength and overall functional mobility.     Baseline  11/15: Patient stated he has not been doing his exercises at home as often as he should.     Time  4    Period  Weeks    Status  Partially Met      PT SHORT TERM GOAL #2   Title  Patient will demonstrate improvement of 1/2 MMT strength grade in all muscle groups tested to assist patient with safe transfers, ambulation, and standing.     Baseline  11/15: see MMT    Time  4    Period  Weeks    Status  Partially Met      PT SHORT TERM GOAL #3   Title  Patient will report or demonstrate ability to perform ambulation for 2 minutes with LRAD with no more than  supervision assistance without rest break and reported no more than minimal difficulty.     Baseline  11/15: amb 55sec and then 1:18 this date with RW and min-mod A due to L knee buckle and BLE weakness; last reassessment was 2:30 straight wiht RW    Time  4    Period  Weeks    Status  Achieved      PT SHORT TERM GOAL #4   Title  Patient will demonstrate ability to ambulate 60 feet without rest break using LRAD with no more than minimal assistance.    Time  2    Period  Weeks    Status  Achieved        PT Long Term Goals - 11/22/18 1118      PT LONG TERM GOAL #1   Title  Patient will report or demonstrate ability to perform ambulation for 5 minutes with LRAD with no more than supervision assistance without rest break and reported no more than minimal difficulty.     Baseline  amb 2:45 straight with min guard and wc follow    Time  6    Period  Weeks    Status  On-going    Target Date  01/03/19      PT LONG TERM GOAL #2   Title  Patient will report ability to stand at a support surface for 10 minutes without a rest break in order to perform household chores with increased ease.     Baseline  4-5 minutes    Time  6    Period  Weeks    Status  On-going      PT LONG TERM GOAL #3   Title  Patient will demonstrate ability to ambulate 120 feet without rest break using LRAD with no more than minimal assistance.    Baseline  90 feet    Time  6    Period  Weeks    Status  On-going      PT LONG TERM GOAL #4   Title  Patient will perform 5x sit to stand from standard chair with good control and single UE assist in 16 seconds or less to demonstrate improved LE strength for functional transfers.    Time  6    Period  Weeks    Status  New        Plan - 12/14/18 1214    Clinical Impression Statement  This session continued with gait training at start and worked in parallel bars for obstacle negotiation during gait. He performed forward and backward step over 1" hurdle with  alternating step patterns to facilitate increased step length. He has 3 episodes of Rt knee buckling but was able to regain balance with bil UE support on bars. EOS performed hamstring stretch and educated patient on using rope/leg lifter to perform stretch at home when laying down. He verbalized and demonstrated the ability to do so. He will continue to benefit from skilled PT interventions to address impairments in aquatic and land-based setting to improve QOL and independence at home.    Rehab Potential  Fair    Clinical Impairments Affecting Rehab Potential  Positive: Highly motivated; Negative: Comorbidities    PT Frequency  3x / week    PT Duration  6 weeks    PT Treatment/Interventions  ADLs/Self Care Home Management;Aquatic Therapy;DME Instruction;Gait training;Functional mobility training;Stair training;Therapeutic activities;Therapeutic exercise;Balance training;Neuromuscular re-education;Patient/family education;Orthotic Fit/Training;Wheelchair mobility training;Manual techniques;Passive range of motion;Energy conservation;Dry needling;Splinting;Taping    PT Next Visit Plan  Continue manual hip adductor stretch and hamstring stretch. Modify exercises as needed and resume standing exercises as able. Gait training with RW and wc follow, hurdle step over in // bars and step ups. Progress step up training as able in // bars. Perform gait with obstacle using RW.    PT Home Exercise Plan  08/16/18:  seated DF/PF, LAQ, marching, abd with theraband and bridge; 11/13: HS and calf stretching (says he has been performing this at home)    Consulted and Agree with Plan of Care  Patient       Patient will benefit from skilled therapeutic intervention in order to improve the following deficits and impairments:  Abnormal gait, Decreased balance, Decreased endurance, Decreased mobility, Difficulty walking, Hypomobility, Decreased range of motion, Decreased activity tolerance, Decreased strength, Impaired  flexibility  Visit Diagnosis: Other abnormalities of gait and mobility  Other symptoms and signs involving the musculoskeletal system  Muscle weakness (generalized)     Problem List Patient Active Problem List   Diagnosis Date Noted  . Pseudogout of knee   . Pyogenic arthritis of right knee joint ([place])   . Falls frequently 05/08/2018  . Fall   . Sleep disturbance   . Post-op pain   . Hypokalemia   . Morbid obesity ([place])   . Postoperative pain   . DVT, lower extremity, distal, acute, bilateral ([place])   . Acute blood loss anemia   . Anemia of chronic disease   . Essential hypertension   . Bacteremia   . Myelopathy ([place]) 03/30/2018  . Paraplegia ([place])   . Postlaminectomy syndrome, lumbar region   . Epidural abscess   . Abdominal distension   . Encephalopathy   . Weakness of both lower extremities   . Discitis of thoracic region   . Spinal cord stimulator status   . Staphylococcus aureus sepsis ([place]) 03/20/2018  . Bacteremia due to methicillin susceptible Staphylococcus aureus (MSSA) 03/18/2018  . Obesity 03/18/2018  . Nephrolithiasis 03/16/2018  . AKI (acute kidney injury) ([place]) 03/16/2018  . Cognitive impairment 03/16/2018  . Chronic pain syndrome 03/16/2018  . B12 deficiency 01/28/2016  . Memory difficulty 07/23/2015  . History of cerebrovascular disease 07/23/2015  . FH: colon cancer 08/13/2014  . Hx of adenomatous colonic polyps 08/13/2014  . Chronic back pain 05/30/2012  .  CAD, NATIVE VESSEL 07/17/2010  . HYPERCHOLESTEROLEMIA 10/31/2009  . SMOKELESS TOBACCO ABUSE 10/31/2009  . Obstructive sleep apnea 10/31/2009  . Essential hypertension, benign 10/31/2009  . GERD 10/31/2009     Kipp Brood, PT, DPT Physical Therapist with [facility]  12/14/2018 12:21 PM    [facility] [address] [place], [place], [address] Phone: [phone]   Fax:  [phone]  Name: Cody Velez MRN: [id number] Date of Birth: 11/03/1946

## 2018-12-18 ENCOUNTER — Encounter (HOSPITAL_COMMUNITY): Payer: Self-pay

## 2018-12-18 ENCOUNTER — Other Ambulatory Visit: Payer: Self-pay

## 2018-12-18 ENCOUNTER — Ambulatory Visit (HOSPITAL_COMMUNITY): Payer: Medicare Other

## 2018-12-18 ENCOUNTER — Telehealth (HOSPITAL_COMMUNITY): Payer: Self-pay

## 2018-12-18 DIAGNOSIS — R29898 Other symptoms and signs involving the musculoskeletal system: Secondary | ICD-10-CM

## 2018-12-18 DIAGNOSIS — M6281 Muscle weakness (generalized): Secondary | ICD-10-CM | POA: Diagnosis not present

## 2018-12-18 DIAGNOSIS — R2689 Other abnormalities of gait and mobility: Secondary | ICD-10-CM | POA: Diagnosis not present

## 2018-12-18 NOTE — Telephone Encounter (Signed)
I called Mr. Kneale to check in as he was not feeling well at his appointment today and had increased pain in lower back. He reports since leaving therapy he got some food and has been able to lay down and rest. He reports he thinks the pain medication is working now as his back is feeling better but he is noticing a headache now. He reports he has not taken his BP or temperature but he will. I again encouraged him to call his MD or seek medical care if either are elevated. I encouraged him to call our office if he is feeling unwell on Friday to let us know.  Kipp Brood, PT, DPT Physical Therapist with [facility]  12/18/2018 4:55 PM

## 2018-12-18 NOTE — Therapy (Signed)
[facility] [place], [place], [address] Phone: [phone]   Fax:  [phone]  Physical Therapy Aquatic Treatment  Patient Details  Name: Cody Velez MRN: [id number] Date of Birth: September 13, 1946 Referring Provider (PT): Asencion Noble, MD   Encounter Date: 12/18/2018  PT End of Session - 12/18/18 1633    Visit Number  45    Number of Visits  8    Date for PT Re-Evaluation  01/03/19   Progress note complete 11/22/2018   Authorization Type  Primary: Medicare; Secondary: [employer] (75 visits per calendar year with 14 used at evaluation)    Authorization Time Period  08/15/18 - 10/14/18; NEW: 10/13/18 to 11/24/18; 11/22/18 - 01/03/19    Authorization - Visit Number  51   KX   Authorization - Number of Visits  75    PT Start Time  4854    PT Stop Time  1408    PT Time Calculation (min)  23 min    Equipment Utilized During Treatment  Gait belt    Activity Tolerance  Patient tolerated treatment well    Behavior During Therapy  WFL for tasks assessed/performed       Past Medical History:  Diagnosis Date  . Allergic rhinitis   . Arthritis   . B12 deficiency   . Cervicogenic headache 01/28/2016  . Childhood asthma   . Chronic back pain   . Coronary atherosclerosis of native coronary artery    Mild nonobstructive CAD at catheterization January 2015  . Depression   . Essential hypertension   . Falls   . GERD (gastroesophageal reflux disease)   . History of cerebrovascular disease 07/23/2015  . History of pneumonia 02/2011  . Hyperlipidemia   . Kidney stone   . Memory difficulty 07/23/2015  . OSA (obstructive sleep apnea)    CPAP - Dr. Gwenette Velez  . Prostate cancer ([place])   . PTSD (post-traumatic stress disorder)    [place]  . Rectal bleeding     Past Surgical History:  Procedure Laterality Date  . APPLICATION OF ROBOTIC ASSISTANCE FOR SPINAL PROCEDURE N/A 03/28/2018   Procedure: APPLICATION OF ROBOTIC ASSISTANCE FOR SPINAL PROCEDURE;   Surgeon: Kristeen Miss, MD;  Location: [facility];  Service: Neurosurgery;  Laterality: N/A;  . BACK SURGERY  02/14/12   lumbar OR #7; "today redid L1L2; replaced screws; added bone from hip"  . BILATERAL KNEE ARTHROSCOPY    . COLONOSCOPY  10/15/2008   Dr. Gala Velez: tubular adenoma   . COLONOSCOPY  12/17/2003   OEV:[id number] rectal and colon  . COLONOSCOPY N/A 09/05/2014   Procedure: COLONOSCOPY;  Surgeon: Daneil Dolin, MD;  [facility];  Service: Endoscopy;  Laterality: N/A;  7:30-rescheduled 9/17 to [place] notified pt  . CYSTOSCOPY WITH STENT PLACEMENT Right 01/27/2016   Procedure: CYSTOSCOPY WITH STENT PLACEMENT;  Surgeon: Cody Gallo, MD;  Location: [facility];  Service: Urology;  Laterality: Right;  . CYSTOSCOPY/RETROGRADE/URETEROSCOPY/STONE EXTRACTION WITH BASKET Right 01/27/2016   Procedure: CYSTOSCOPY, RIGHT RETROGRADE, RIGHT URETEROSCOPY, STONE EXTRACTION ;  Surgeon: Cody Gallo, MD;  Location: [facility];  Service: Urology;  Laterality: Right;  . ESOPHAGOGASTRODUODENOSCOPY  10/15/2008     Dr Rourk:Schatzki's ring status post dilation and disruption via 49 F Maloney dilator/ otherwise unremarkable esophagus, small hiatal hernia, multiple fundal gland polyps not manipulated, gastritis, negative H.pylori  . ESOPHAGOGASTRODUODENOSCOPY  06/21/02   [facility]:WEXHB sliding hiatal hernia with mild changes of reflux esophagitis limited to gastroesophageal junction.  Noncritical  ring at distal esophagus, 3 cm proximal to gastroesophageal junction/Antral gastritis  . [place]   "broke face playing softball"  . FRACTURE SURGERY     "left wrist; broke it; took spur off"  . HOLMIUM LASER APPLICATION Right 05/22/8452   Procedure: HOLMIUM LASER APPLICATION;  Surgeon: Cody Gallo, MD;  Location: [facility];  Service: Urology;  Laterality: Right;  . KNEE ARTHROSCOPY Right 05/18/2018   Procedure: PARTIAL MEDIAL MENISECTOMY AND SURGICAL LAVAGE AND CHONDROPLASTY;  Surgeon:  [name], MD;  Location: [facility];  Service: Orthopedics;  Laterality: Right;  . LEFT HEART CATHETERIZATION WITH CORONARY ANGIOGRAM N/A 12/27/2013   Procedure: LEFT HEART CATHETERIZATION WITH CORONARY ANGIOGRAM;  Surgeon: Cody M Martinique, MD;  Location: [facility];  Service: Cardiovascular;  Laterality: N/A;  . neck epidural    . POSTERIOR LUMBAR FUSION 4 LEVEL N/A 03/28/2018   Procedure: Thoracic eight -Lumbar two- FIXATION WITH SCREW PLACEMENT, DECOMPRESSION Thoracic ten-Thoracic eleven  FOR OSTEOMYELITIS;  Surgeon: Kristeen Miss, MD;  Location: [facility];  Service: Neurosurgery;  Laterality: N/A;  . SHOULDER SURGERY Bilateral   . TEE WITHOUT CARDIOVERSION N/A 03/21/2018   Procedure: TRANSESOPHAGEAL ECHOCARDIOGRAM (TEE) WITH PROPOFOL;  Surgeon: Cody Sark, MD;  [facility];  Service: Cardiovascular;  Laterality: N/A;    There were no vitals filed for this visit.  Subjective Assessment - 12/18/18 1627    Subjective  Patient reports his lower back is hurting badly today and he considered cancelling his appointment. He reports his back hurts at about 8/10. He states he hasn't had pain this bad in a long time.    Pertinent History  T10-11 Epidural Abscess, back surgery on 03/28/18, right knee partial medial menisectomy and chondroplasty    Limitations  Standing;Walking;House hold activities;Lifting    Diagnostic tests  CT Lumbar Spine 05/12/18: "Spinal stimulator device, along with multilevel multisegment fusion hardware appear appropriately placed without adverse features."    Patient Stated Goals  To walk    Currently in Pain?  Yes    Pain Score  8     Pain Location  Back    Pain Orientation  Lower    Pain Descriptors / Indicators  Aching;Sharp    Pain Type  Acute pain    Pain Onset  Today    Pain Frequency  Constant    Aggravating Factors   unsure hurt this morning when waking up    Pain Relieving Factors  normally percocet helps but hasn't helped today       Transfers:   Squat pivot transfer performed 2xwith set up assistance and Mod I levelfrom manual wheelchair to pool chair lift. Patient utilized pool chair lift to get in/out of pool for therapy session  Adult Aquatic Therapy - 12/18/18 1629      Aquatic Therapy Subjective   Subjective  Patient reports his lower back is hurting badly today and he considered cancelling his appointment. He reports his back hurts at about 8/10. He states he hasn't had pain this bad in a long time.      Treatment   Gait  3x 30' RT forward gait in lap lane. Patient with no scissoring and imrpoved balance during turn with no LOB.    Exercises  5 reps squat at wall with Bil UE support.    pt stopped after 5th requested to stop session due to pain      PT Education - 12/18/18 1631    Education Details  Educated  that if his pain is continuing to rise that it is not beneficial to push through the session and continue to aggravate it. Educated that he should check his BP and temperature when he gets home. If either are elevated educated patient that he should call his MD or seek emedical care.     Person(s) Educated  Patient    Methods  Explanation    Comprehension  Verbalized understanding       PT Short Term Goals - 11/22/18 1117      PT SHORT TERM GOAL #1   Title  Patient will report understanding and regular compliance with HEP to improve strength and overall functional mobility.     Baseline  11/15: Patient stated he has not been doing his exercises at home as often as he should.     Time  4    Period  Weeks    Status  Partially Met      PT SHORT TERM GOAL #2   Title  Patient will demonstrate improvement of 1/2 MMT strength grade in all muscle groups tested to assist patient with safe transfers, ambulation, and standing.     Baseline  11/15: see MMT    Time  4    Period  Weeks    Status  Partially Met      PT SHORT TERM GOAL #3   Title  Patient will report or demonstrate ability to perform ambulation for 2  minutes with LRAD with no more than supervision assistance without rest break and reported no more than minimal difficulty.     Baseline  11/15: amb 55sec and then 1:18 this date with RW and min-mod A due to L knee buckle and BLE weakness; last reassessment was 2:30 straight wiht RW    Time  4    Period  Weeks    Status  Achieved      PT SHORT TERM GOAL #4   Title  Patient will demonstrate ability to ambulate 60 feet without rest break using LRAD with no more than minimal assistance.    Time  2    Period  Weeks    Status  Achieved        PT Long Term Goals - 11/22/18 1118      PT LONG TERM GOAL #1   Title  Patient will report or demonstrate ability to perform ambulation for 5 minutes with LRAD with no more than supervision assistance without rest break and reported no more than minimal difficulty.     Baseline  amb 2:45 straight with min guard and wc follow    Time  6    Period  Weeks    Status  On-going    Target Date  01/03/19      PT LONG TERM GOAL #2   Title  Patient will report ability to stand at a support surface for 10 minutes without a rest break in order to perform household chores with increased ease.     Baseline  4-5 minutes    Time  6    Period  Weeks    Status  On-going      PT LONG TERM GOAL #3   Title  Patient will demonstrate ability to ambulate 120 feet without rest break using LRAD with no more than minimal assistance.    Baseline  90 feet    Time  6    Period  Weeks    Status  On-going      PT  LONG TERM GOAL #4   Title  Patient will perform 5x sit to stand from standard chair with good control and single UE assist in 16 seconds or less to demonstrate improved LE strength for functional transfers.    Time  6    Period  Weeks    Status  New        Plan - 12/18/18 1633    Clinical Impression Statement  Patient arrived for aquatic therapy session today reporting he has had an increase in low back pain today. He denies vomiting, diarrhea, nausea, and  states he did not check his BP or temperature. He reported 8/10 pain at start of session. He reports he would like to try participating in therapy session. Mr. Cowsert performed ~ 120' of gait training with no UE support and denied increase in pain with this. As he began squats at the wall he reported his back pain increased and after ~ 5 reps he requested to stop the session due to severity of his back pain and to reschedule another aquatics appointment. Session ended early and patient was educated to check temperature and BP at home and to call MD or seek medical care if elevated.     Rehab Potential  Fair    Clinical Impairments Affecting Rehab Potential  Positive: Highly motivated; Negative: Comorbidities    PT Frequency  3x / week    PT Duration  6 weeks    PT Treatment/Interventions  ADLs/Self Care Home Management;Aquatic Therapy;DME Instruction;Gait training;Functional mobility training;Stair training;Therapeutic activities;Therapeutic exercise;Balance training;Neuromuscular re-education;Patient/family education;Orthotic Fit/Training;Wheelchair mobility training;Manual techniques;Passive range of motion;Energy conservation;Dry needling;Splinting;Taping    PT Next Visit Plan  Follow up on low back pain from Monday. Continue manual hip adductor stretch and hamstring stretch. Modify exercises as needed and resume standing exercises as able. Gait training with RW and wc follow, hurdle step over in // bars and step ups. Progress step up training as able in // bars. Perform gait with obstacle using RW.    PT Home Exercise Plan  08/16/18:  seated DF/PF, LAQ, marching, abd with theraband and bridge; 11/13: HS and calf stretching (says he has been performing this at home)    Consulted and Agree with Plan of Care  Patient       Patient will benefit from skilled therapeutic intervention in order to improve the following deficits and impairments:  Abnormal gait, Decreased balance, Decreased endurance,  Decreased mobility, Difficulty walking, Hypomobility, Decreased range of motion, Decreased activity tolerance, Decreased strength, Impaired flexibility  Visit Diagnosis: Other abnormalities of gait and mobility  Other symptoms and signs involving the musculoskeletal system  Muscle weakness (generalized)     Problem List Patient Active Problem List   Diagnosis Date Noted  . Pseudogout of knee   . Pyogenic arthritis of right knee joint ([place])   . Falls frequently 05/08/2018  . Fall   . Sleep disturbance   . Post-op pain   . Hypokalemia   . Morbid obesity ([place])   . Postoperative pain   . DVT, lower extremity, distal, acute, bilateral ([place])   . Acute blood loss anemia   . Anemia of chronic disease   . Essential hypertension   . Bacteremia   . Myelopathy ([place]) 03/30/2018  . Paraplegia ([place])   . Postlaminectomy syndrome, lumbar region   . Epidural abscess   . Abdominal distension   . Encephalopathy   . Weakness of both lower extremities   . Discitis of thoracic region   . Spinal  cord stimulator status   . Staphylococcus aureus sepsis ([place]) 03/20/2018  . Bacteremia due to methicillin susceptible Staphylococcus aureus (MSSA) 03/18/2018  . Obesity 03/18/2018  . Nephrolithiasis 03/16/2018  . AKI (acute kidney injury) ([place]) 03/16/2018  . Cognitive impairment 03/16/2018  . Chronic pain syndrome 03/16/2018  . B12 deficiency 01/28/2016  . Memory difficulty 07/23/2015  . History of cerebrovascular disease 07/23/2015  . FH: colon cancer 08/13/2014  . Hx of adenomatous colonic polyps 08/13/2014  . Chronic back pain 05/30/2012  . CAD, NATIVE VESSEL 07/17/2010  . HYPERCHOLESTEROLEMIA 10/31/2009  . SMOKELESS TOBACCO ABUSE 10/31/2009  . Obstructive sleep apnea 10/31/2009  . Essential hypertension, benign 10/31/2009  . GERD 10/31/2009    Kipp Brood, PT, DPT Physical Therapist with [facility]  12/18/2018 4:34 PM    [facility] [address] [place], [place], [address] Phone: [phone]   Fax:  [phone]  Name: Cody Velez MRN: [id number] Date of Birth: 05-07-1946

## 2018-12-22 ENCOUNTER — Ambulatory Visit (HOSPITAL_COMMUNITY): Payer: Medicare Other | Attending: Internal Medicine

## 2018-12-22 ENCOUNTER — Encounter (HOSPITAL_COMMUNITY): Payer: Self-pay

## 2018-12-22 DIAGNOSIS — R29898 Other symptoms and signs involving the musculoskeletal system: Secondary | ICD-10-CM | POA: Diagnosis not present

## 2018-12-22 DIAGNOSIS — R2689 Other abnormalities of gait and mobility: Secondary | ICD-10-CM | POA: Diagnosis not present

## 2018-12-22 DIAGNOSIS — M6281 Muscle weakness (generalized): Secondary | ICD-10-CM

## 2018-12-22 NOTE — Therapy (Signed)
[place], [place], [address] Phone: [phone]   Fax:  [phone]  Physical Therapy Treatment  Patient Details  Name: Cody Velez MRN: [id number] Date of Birth: Jun 22, 1946 Referring Provider (PT): Asencion Noble, MD   Encounter Date: 12/22/2018  PT End of Session - 12/22/18 0912    Visit Number  46    Number of Visits  59    Date for PT Re-Evaluation  01/03/19   Progress note complete 11/22/2018   Authorization Type  Primary: Medicare; Secondary: BC[employer] (75 visits per calendar year with 14 used at evaluation)    Authorization Time Period  08/15/18 - 10/14/18; NEW: 10/13/18 to 11/24/18; 11/22/18 - 01/03/19    Authorization - Visit Number  3   KX   Authorization - Number of Visits  75    PT Start Time  0816    PT Stop Time  0903    PT Time Calculation (min)  47 min    Equipment Utilized During Treatment  Gait belt    Activity Tolerance  Patient tolerated treatment well    Behavior During Therapy  [facility] for tasks assessed/performed       Past Medical History:  Diagnosis Date  . Allergic rhinitis   . Arthritis   . B12 deficiency   . Cervicogenic headache 01/28/2016  . Childhood asthma   . Chronic back pain   . Coronary atherosclerosis of native coronary artery    Mild nonobstructive CAD at catheterization January 2015  . Depression   . Essential hypertension   . Falls   . GERD (gastroesophageal reflux disease)   . History of cerebrovascular disease 07/23/2015  . History of pneumonia 02/2011  . Hyperlipidemia   . Kidney stone   . Memory difficulty 07/23/2015  . OSA (obstructive sleep apnea)    CPAP - Dr. [name]  . Prostate cancer ([place])   . PTSD (post-traumatic stress disorder)    [place]  . Rectal bleeding     Past Surgical History:  Procedure Laterality Date  . APPLICATION OF ROBOTIC ASSISTANCE FOR SPINAL PROCEDURE N/A 03/28/2018   Procedure: APPLICATION OF ROBOTIC ASSISTANCE FOR SPINAL PROCEDURE;  Surgeon:  Kristeen Miss, MD;  Location: [facility];  Service: Neurosurgery;  Laterality: N/A;  . BACK SURGERY  02/14/12   lumbar OR #7; "today redid L1L2; replaced screws; added bone from hip"  . BILATERAL KNEE ARTHROSCOPY    . COLONOSCOPY  10/15/2008   Dr. [name]: tubular adenoma   . COLONOSCOPY  12/17/2003   JQG:BEEFEO rectal and colon  . COLONOSCOPY N/A 09/05/2014   Procedure: COLONOSCOPY;  Surgeon: Daneil Dolin, MD;  [facility];  Service: Endoscopy;  Laterality: N/A;  7:30-rescheduled 9/17 to [place] notified pt  . CYSTOSCOPY WITH STENT PLACEMENT Right 01/27/2016   Procedure: CYSTOSCOPY WITH STENT PLACEMENT;  Surgeon: [name], MD;  Location: [facility];  Service: Urology;  Laterality: Right;  . CYSTOSCOPY/RETROGRADE/URETEROSCOPY/STONE EXTRACTION WITH BASKET Right 01/27/2016   Procedure: CYSTOSCOPY, RIGHT RETROGRADE, RIGHT URETEROSCOPY, STONE EXTRACTION ;  Surgeon: [name], MD;  Location: [facility];  Service: Urology;  Laterality: Right;  . ESOPHAGOGASTRODUODENOSCOPY  10/15/2008     Dr Rourk:Schatzki's ring status post dilation and disruption via 36 F Maloney dilator/ otherwise unremarkable esophagus, small hiatal hernia, multiple fundal gland polyps not manipulated, gastritis, negative H.pylori  . ESOPHAGOGASTRODUODENOSCOPY  06/21/02   FHQ:RFXJO sliding hiatal hernia with mild changes of reflux esophagitis limited to gastroesophageal junction.  Noncritical ring  at distal esophagus, 3 cm proximal to gastroesophageal junction/Antral gastritis  . [place]   "broke face playing softball"  . FRACTURE SURGERY     "left wrist; broke it; took spur off"  . HOLMIUM LASER APPLICATION Right 06/24/4491   Procedure: HOLMIUM LASER APPLICATION;  Surgeon: [name], MD;  Location: [facility];  Service: Urology;  Laterality: Right;  . KNEE ARTHROSCOPY Right 05/18/2018   Procedure: PARTIAL MEDIAL MENISECTOMY AND SURGICAL LAVAGE AND CHONDROPLASTY;  Surgeon: [name], MD;  Location: [facility];  Service: Orthopedics;  Laterality: Right;  . LEFT HEART CATHETERIZATION WITH CORONARY ANGIOGRAM N/A 12/27/2013   Procedure: LEFT HEART CATHETERIZATION WITH CORONARY ANGIOGRAM;  Surgeon: [name], MD;  Location: [facility] CATH LAB;  Service: Cardiovascular;  Laterality: N/A;  . neck epidural    . POSTERIOR LUMBAR FUSION 4 LEVEL N/A 03/28/2018   Procedure: Thoracic eight -Lumbar two- FIXATION WITH SCREW PLACEMENT, DECOMPRESSION Thoracic ten-Thoracic eleven  FOR OSTEOMYELITIS;  Surgeon: Kristeen Miss, MD;  Location: [facility];  Service: Neurosurgery;  Laterality: N/A;  . SHOULDER SURGERY Bilateral   . TEE WITHOUT CARDIOVERSION N/A 03/21/2018   Procedure: TRANSESOPHAGEAL ECHOCARDIOGRAM (TEE) WITH PROPOFOL;  Surgeon: [name], MD;  [facility];  Service: Cardiovascular;  Laterality: N/A;    There were no vitals filed for this visit.  Subjective Assessment - 12/22/18 0813    Subjective  Pt stated he is feeling good today, no reports of pain today.      Patient Stated Goals  To walk    Currently in Pain?  No/denies                       [facility] Adult PT Treatment/Exercise - 12/22/18 0001      Ambulation/Gait   Ambulation/Gait  Yes    Ambulation/Gait Assistance  4: Min guard    Ambulation/Gait Assistance Details  RW and WC pushed behind    Ambulation Distance (Feet)  178 Feet   1 set, no rest breaks   Assistive device  Rolling walker    Gait Pattern  Decreased dorsiflexion - left;Decreased dorsiflexion - right;Trunk flexed;Decreased step length - right;Decreased step length - left;Step-through pattern;Scissoring      Knee/Hip Exercises: Stretches   Passive Hamstring Stretch  Both;3 reps;30 seconds    Passive Hamstring Stretch Limitations  supine manual    Other Knee/Hip Stretches  Supine adductor stretch BLE 2x 30"      Knee/Hip Exercises: Standing   Forward Step Up  Both;Hand Hold: 2;Step Height: 4"    Other Standing Knee Exercises   Hurdle negotiation/gait training: 2x RT fwd/bkwd 4 (1") hurdles in // bars, 2x RT lateral stepping  4 (1") hurdles in // bars.               PT Short Term Goals - 11/22/18 1117      PT SHORT TERM GOAL #1   Title  Patient will report understanding and regular compliance with HEP to improve strength and overall functional mobility.     Baseline  11/15: Patient stated he has not been doing his exercises at home as often as he should.     Time  4    Period  Weeks    Status  Partially Met      PT SHORT TERM GOAL #2   Title  Patient will demonstrate improvement of 1/2 MMT strength grade in all muscle groups tested to assist patient with safe  transfers, ambulation, and standing.     Baseline  11/15: see MMT    Time  4    Period  Weeks    Status  Partially Met      PT SHORT TERM GOAL #3   Title  Patient will report or demonstrate ability to perform ambulation for 2 minutes with LRAD with no more than supervision assistance without rest break and reported no more than minimal difficulty.     Baseline  11/15: amb 55sec and then 1:18 this date with RW and min-mod A due to L knee buckle and BLE weakness; last reassessment was 2:30 straight wiht RW    Time  4    Period  Weeks    Status  Achieved      PT SHORT TERM GOAL #4   Title  Patient will demonstrate ability to ambulate 60 feet without rest break using LRAD with no more than minimal assistance.    Time  2    Period  Weeks    Status  Achieved        PT Long Term Goals - 11/22/18 1118      PT LONG TERM GOAL #1   Title  Patient will report or demonstrate ability to perform ambulation for 5 minutes with LRAD with no more than supervision assistance without rest break and reported no more than minimal difficulty.     Baseline  amb 2:45 straight with min guard and wc follow    Time  6    Period  Weeks    Status  On-going    Target Date  01/03/19      PT LONG TERM GOAL #2   Title  Patient will report ability to stand at a  support surface for 10 minutes without a rest break in order to perform household chores with increased ease.     Baseline  4-5 minutes    Time  6    Period  Weeks    Status  On-going      PT LONG TERM GOAL #3   Title  Patient will demonstrate ability to ambulate 120 feet without rest break using LRAD with no more than minimal assistance.    Baseline  90 feet    Time  6    Period  Weeks    Status  On-going      PT LONG TERM GOAL #4   Title  Patient will perform 5x sit to stand from standard chair with good control and single UE assist in 16 seconds or less to demonstrate improved LE strength for functional transfers.    Time  6    Period  Weeks    Status  New            Plan - 12/22/18 1001    Clinical Impression Statement  Pt presents with increased activity tolerance and improved sequence with gait and therex today.  Able to ambulate wiht RW for 178 feet without seated rest break and increased cadence.  Continued wiht hudles for balance and able to increased height with step up training wiht minimal difficulty.  Pt does continue to fatigue requiring seated rest breaks during therex.  Lt knee did buckle 2x during session requiring HHA within // bar activites and min A during gait.  No reports of pain during session.      Rehab Potential  Fair    Clinical Impairments Affecting Rehab Potential  Positive: Highly motivated; Negative: Comorbidities    PT Frequency  3x / week    PT Duration  6 weeks    PT Treatment/Interventions  ADLs/Self Care Home Management;Aquatic Therapy;DME Instruction;Gait training;Functional mobility training;Stair training;Therapeutic activities;Therapeutic exercise;Balance training;Neuromuscular re-education;Patient/family education;Orthotic Fit/Training;Wheelchair mobility training;Manual techniques;Passive range of motion;Energy conservation;Dry needling;Splinting;Taping    PT Next Visit Plan  Begin step down training.  Continue manual hip adductor stretch and  hamstring stretch. Modify exercises as needed and resume standing exercises as able. Gait training with RW and wc follow, hurdle step over in // bars and step ups. Progress step up training as able in // bars. Perform gait with obstacle using RW.    PT Home Exercise Plan  08/16/18:  seated DF/PF, LAQ, marching, abd with theraband and bridge; 11/13: HS and calf stretching (says he has been performing this at home)       Patient will benefit from skilled therapeutic intervention in order to improve the following deficits and impairments:  Abnormal gait, Decreased balance, Decreased endurance, Decreased mobility, Difficulty walking, Hypomobility, Decreased range of motion, Decreased activity tolerance, Decreased strength, Impaired flexibility  Visit Diagnosis: Other abnormalities of gait and mobility  Other symptoms and signs involving the musculoskeletal system  Muscle weakness (generalized)     Problem List Patient Active Problem List   Diagnosis Date Noted  . Pseudogout of knee   . Pyogenic arthritis of right knee joint ([place])   . Falls frequently 05/08/2018  . Fall   . Sleep disturbance   . Post-op pain   . Hypokalemia   . Morbid obesity ([place])   . Postoperative pain   . DVT, lower extremity, distal, acute, bilateral ([place])   . Acute blood loss anemia   . Anemia of chronic disease   . Essential hypertension   . Bacteremia   . Myelopathy ([place]) 03/30/2018  . Paraplegia ([place])   . Postlaminectomy syndrome, lumbar region   . Epidural abscess   . Abdominal distension   . Encephalopathy   . Weakness of both lower extremities   . Discitis of thoracic region   . Spinal cord stimulator status   . Staphylococcus aureus sepsis ([place]) 03/20/2018  . Bacteremia due to methicillin susceptible Staphylococcus aureus (MSSA) 03/18/2018  . Obesity 03/18/2018  . Nephrolithiasis 03/16/2018  . AKI (acute kidney injury) ([place]) 03/16/2018  . Cognitive impairment 03/16/2018  . Chronic pain syndrome  03/16/2018  . B12 deficiency 01/28/2016  . Memory difficulty 07/23/2015  . History of cerebrovascular disease 07/23/2015  . FH: colon cancer 08/13/2014  . Hx of adenomatous colonic polyps 08/13/2014  . Chronic back pain 05/30/2012  . CAD, NATIVE VESSEL 07/17/2010  . HYPERCHOLESTEROLEMIA 10/31/2009  . SMOKELESS TOBACCO ABUSE 10/31/2009  . Obstructive sleep apnea 10/31/2009  . Essential hypertension, benign 10/31/2009  . GERD 10/31/2009   Ihor Austin, [place]; [place]  Aldona Lento 12/22/2018, 1:32 PM  [place] [address] [place], [place], [address] Phone: [phone]   Fax:  [phone]  Name: Cody Velez MRN: [id number] Date of Birth: 07-Jul-1946

## 2018-12-25 ENCOUNTER — Telehealth (HOSPITAL_COMMUNITY): Payer: Self-pay

## 2018-12-25 ENCOUNTER — Ambulatory Visit (HOSPITAL_COMMUNITY): Payer: Medicare Other

## 2018-12-25 NOTE — Telephone Encounter (Signed)
Patient called to cancel for this week he is going out of town to a funeral.

## 2018-12-27 ENCOUNTER — Encounter (HOSPITAL_COMMUNITY): Payer: Medicare Other

## 2018-12-29 ENCOUNTER — Encounter (HOSPITAL_COMMUNITY): Payer: Medicare Other

## 2019-01-01 ENCOUNTER — Encounter (HOSPITAL_COMMUNITY): Payer: Self-pay

## 2019-01-01 ENCOUNTER — Other Ambulatory Visit: Payer: Self-pay

## 2019-01-01 ENCOUNTER — Ambulatory Visit (HOSPITAL_COMMUNITY): Payer: Medicare Other

## 2019-01-01 DIAGNOSIS — M6281 Muscle weakness (generalized): Secondary | ICD-10-CM

## 2019-01-01 DIAGNOSIS — R29898 Other symptoms and signs involving the musculoskeletal system: Secondary | ICD-10-CM

## 2019-01-01 DIAGNOSIS — R2689 Other abnormalities of gait and mobility: Secondary | ICD-10-CM | POA: Diagnosis not present

## 2019-01-01 NOTE — Therapy (Signed)
[facility] [address] [place], [place], [address] Phone: [phone]   Fax:  [phone]  Physical Therapy Aquatic Treatment  Patient Details  Name: Cody Velez MRN: [id number] Date of Birth: 11/02/1946 Referring Provider (PT): Roy Fagan, MD   Encounter Date: 01/01/2019  PT End of Session - 01/01/19 1654    Visit Number  47    Number of Visits  61    Date for PT Re-Evaluation  01/03/19   Progress note complete 11/22/2018   Authorization Type  Primary: Medicare; Secondary: [employer] (75 visits per calendar year with 14 used at evaluation)    Authorization Time Period  08/15/18 - 10/14/18; NEW: 10/13/18 to 11/24/18; 11/22/18 - 01/03/19    Authorization - Visit Number  2   starting for 2020   Authorization - Number of Visits  75    PT Start Time  1349    PT Stop Time  1431    PT Time Calculation (min)  42 min    Equipment Utilized During Treatment  Gait belt    Activity Tolerance  Patient tolerated treatment well    Behavior During Therapy  WFL for tasks assessed/performed       Past Medical History:  Diagnosis Date  . Allergic rhinitis   . Arthritis   . B12 deficiency   . Cervicogenic headache 01/28/2016  . Childhood asthma   . Chronic back pain   . Coronary atherosclerosis of native coronary artery    Mild nonobstructive CAD at catheterization January 2015  . Depression   . Essential hypertension   . Falls   . GERD (gastroesophageal reflux disease)   . History of cerebrovascular disease 07/23/2015  . History of pneumonia 02/2011  . Hyperlipidemia   . Kidney stone   . Memory difficulty 07/23/2015  . OSA (obstructive sleep apnea)    CPAP - Dr. Clance  . Prostate cancer (HCC)   . PTSD (post-traumatic stress disorder)    [place]  . Rectal bleeding     Past Surgical History:  Procedure Laterality Date  . APPLICATION OF ROBOTIC ASSISTANCE FOR SPINAL PROCEDURE N/A 03/28/2018   Procedure: APPLICATION OF ROBOTIC ASSISTANCE FOR  SPINAL PROCEDURE;  Surgeon: Elsner, Henry, MD;  Location: [facility] OR;  Service: Neurosurgery;  Laterality: N/A;  . BACK SURGERY  02/14/12   lumbar OR #7; "today redid L1L2; replaced screws; added bone from hip"  . BILATERAL KNEE ARTHROSCOPY    . COLONOSCOPY  10/15/2008   Dr. Rourk: tubular adenoma   . COLONOSCOPY  12/17/2003   RMR:Normal rectal and colon  . COLONOSCOPY N/A 09/05/2014   Procedure: COLONOSCOPY;  Surgeon: Robert M Rourk, MD;  Location: [facility];  Service: Endoscopy;  Laterality: N/A;  7:30-rescheduled 9/17 to 1030 Leigh Ann notified pt  . CYSTOSCOPY WITH STENT PLACEMENT Right 01/27/2016   Procedure: CYSTOSCOPY WITH STENT PLACEMENT;  Surgeon: Stephen Dahlstedt, MD;  Location: [facility] ORS;  Service: Urology;  Laterality: Right;  . CYSTOSCOPY/RETROGRADE/URETEROSCOPY/STONE EXTRACTION WITH BASKET Right 01/27/2016   Procedure: CYSTOSCOPY, RIGHT RETROGRADE, RIGHT URETEROSCOPY, STONE EXTRACTION ;  Surgeon: Stephen Dahlstedt, MD;  Location: [facility];  Service: Urology;  Laterality: Right;  . ESOPHAGOGASTRODUODENOSCOPY  10/15/2008     Dr Rourk:Schatzki's ring status post dilation and disruption via 58 F Maloney dilator/ otherwise unremarkable esophagus, small hiatal hernia, multiple fundal gland polyps not manipulated, gastritis, negative H.pylori  . ESOPHAGOGASTRODUODENOSCOPY  06/21/02   NUR:Small sliding hiatal hernia with mild changes of reflux esophagitis limited to gastroesophageal junction.    Noncritical ring at distal esophagus, 3 cm proximal to gastroesophageal junction/Antral gastritis  . FACIAL COSMETIC SURGERY  ` 1985   "broke face playing softball"  . FRACTURE SURGERY     "left wrist; broke it; took spur off"  . HOLMIUM LASER APPLICATION Right 01/27/2016   Procedure: HOLMIUM LASER APPLICATION;  Surgeon: Stephen Dahlstedt, MD;  Location: [facility];  Service: Urology;  Laterality: Right;  . KNEE ARTHROSCOPY Right 05/18/2018   Procedure: PARTIAL MEDIAL MENISECTOMY AND SURGICAL LAVAGE AND  CHONDROPLASTY;  Surgeon: Landau, Joshua, MD;  Location: [facility] OR;  Service: Orthopedics;  Laterality: Right;  . LEFT HEART CATHETERIZATION WITH CORONARY ANGIOGRAM N/A 12/27/2013   Procedure: LEFT HEART CATHETERIZATION WITH CORONARY ANGIOGRAM;  Surgeon: Peter M Jordan, MD;  Location: [facility] C[facility] LAB;  Service: Cardiovascular;  Laterality: N/A;  . neck epidural    . POSTERIOR LUMBAR FUSION 4 LEVEL N/A 03/28/2018   Procedure: Thoracic eight -Lumbar two- FIXATION WITH SCREW PLACEMENT, DECOMPRESSION Thoracic ten-Thoracic eleven  FOR OSTEOMYELITIS;  Surgeon: Elsner, Henry, MD;  Location: [facility] OR;  Service: Neurosurgery;  Laterality: N/A;  . SHOULDER SURGERY Bilateral   . TEE WITHOUT CARDIOVERSION N/A 03/21/2018   Procedure: TRANSESOPHAGEAL ECHOCARDIOGRAM (TEE) WITH PROPOFOL;  Surgeon: McDowell, Samuel G, MD;  Location: [facility];  Service: Cardiovascular;  Laterality: N/A;    There were no vitals filed for this visit.  Subjective Assessment - 01/01/19 1646    Subjective  Patient recently returned from a trip to [place] to attend the funeral service of a close friend. He reports he has been tired since returning Saturday and yesterday he rested a lot. He feels good today and denies pain.     Pertinent History  T10-11 Epidural Abscess, back surgery on 03/28/18, right knee partial medial menisectomy and chondroplasty    Limitations  Standing;Walking;House hold activities;Lifting    Diagnostic tests  CT Lumbar Spine 05/12/18: "Spinal stimulator device, along with multilevel multisegment fusion hardware appear appropriately placed without adverse features."    Patient Stated Goals  To walk    Currently in Pain?  No/denies       Adult Aquatic Therapy - 01/01/19 1648      Aquatic Therapy Subjective   Subjective  Patient recently returned from a trip to [place] to attend the funeral service of a close friend. He reports he has been tired since returning Saturday and yesterday he rested a lot. He feels good today and  denies pain.       Treatment   Gait  3x 20', Fwd/Bkwd walking in chest deep water.    Exercises  Deep water exercise: Single leg stretch/high knees, 5lbs ankle weights, 20 reps bil LE's. Jumping jack kicks, 1x 1 minute, 5lba ankle weights. 4x 15 seconds scissor kick with 5lb ankle weights. 20 reps breaststroke kick with bil UE support on wall. 1x 40' bicycle kick to aqua-jog a lap around deep end of pool.     performed with rainbow float belt, and blue hand buoys bil   Specific Exercises  Hip/Low Back    Hip/Low Back  15 reps bil LE's hip abduction with 5 lbs ankle weights, 15 reps bil LE's hip extension with 5 lbs ankle weights.     Balance  5 reps bil LE's with 1 HHA, SLS cone taps (2x "jump cone").        PT Education - 01/01/19 1654    Education Details  Educated on exercises throughout session and reviewed importance of HEP and stretching participation. Reminded there   is no aquatic therapy next week.     Person(s) Educated  Patient    Methods  Explanation    Comprehension  Verbalized understanding       PT Short Term Goals - 11/22/18 1117      PT SHORT TERM GOAL #1   Title  Patient will report understanding and regular compliance with HEP to improve strength and overall functional mobility.     Baseline  11/15: Patient stated he has not been doing his exercises at home as often as he should.     Time  4    Period  Weeks    Status  Partially Met      PT SHORT TERM GOAL #2   Title  Patient will demonstrate improvement of 1/2 MMT strength grade in all muscle groups tested to assist patient with safe transfers, ambulation, and standing.     Baseline  11/15: see MMT    Time  4    Period  Weeks    Status  Partially Met      PT SHORT TERM GOAL #3   Title  Patient will report or demonstrate ability to perform ambulation for 2 minutes with LRAD with no more than supervision assistance without rest break and reported no more than minimal difficulty.     Baseline  11/15: amb 55sec  and then 1:18 this date with RW and min-mod A due to L knee buckle and BLE weakness; last reassessment was 2:30 straight wiht RW    Time  4    Period  Weeks    Status  Achieved      PT SHORT TERM GOAL #4   Title  Patient will demonstrate ability to ambulate 60 feet without rest break using LRAD with no more than minimal assistance.    Time  2    Period  Weeks    Status  Achieved        PT Long Term Goals - 11/22/18 1118      PT LONG TERM GOAL #1   Title  Patient will report or demonstrate ability to perform ambulation for 5 minutes with LRAD with no more than supervision assistance without rest break and reported no more than minimal difficulty.     Baseline  amb 2:45 straight with min guard and wc follow    Time  6    Period  Weeks    Status  On-going    Target Date  01/03/19      PT LONG TERM GOAL #2   Title  Patient will report ability to stand at a support surface for 10 minutes without a rest break in order to perform household chores with increased ease.     Baseline  4-5 minutes    Time  6    Period  Weeks    Status  On-going      PT LONG TERM GOAL #3   Title  Patient will demonstrate ability to ambulate 120 feet without rest break using LRAD with no more than minimal assistance.    Baseline  90 feet    Time  6    Period  Weeks    Status  On-going      PT LONG TERM GOAL #4   Title  Patient will perform 5x sit to stand from standard chair with good control and single UE assist in 16 seconds or less to demonstrate improved LE strength for functional transfers.    Time  6  Period  Weeks    Status  New       Plan - 01/01/19 1657    Clinical Impression Statement  Patient arrived with no pain and was able to complete entire aquatic therapy session today. Initiated with walking program and hip strengthening using 5lbs ankle weights. Patient wore ankle weights for all deep-water exercises except bicycle kicking.  He demonstrated good endurance with repeated kicking for  15 sec to 1 minute and did not require extra rest breaks. He continues to have difficulty coordinating Lt LE during SLS cone taps and required min assist to improve placement/direction of movement. He does demosntrate improved step pattern with gait in aquatic setting however with reduced scissoring. He will continue to benefit from skilled PT interventions to address impairments in aquatic and land-based setting to improve QOL and independence at home.    Rehab Potential  Fair    Clinical Impairments Affecting Rehab Potential  Positive: Highly motivated; Negative: Comorbidities    PT Frequency  3x / week    PT Duration  6 weeks    PT Treatment/Interventions  ADLs/Self Care Home Management;Aquatic Therapy;DME Instruction;Gait training;Functional mobility training;Stair training;Therapeutic activities;Therapeutic exercise;Balance training;Neuromuscular re-education;Patient/family education;Orthotic Fit/Training;Wheelchair mobility training;Manual techniques;Passive range of motion;Energy conservation;Dry needling;Splinting;Taping    PT Next Visit Plan  Begin step down training.  Continue manual hip adductor stretch and hamstring stretch. Gait training with RW and wc follow, hurdle step over in // bars and step ups. Progress step up training as able in // bars. Perform gait with obstacle using RW.    PT Home Exercise Plan  08/16/18:  seated DF/PF, LAQ, marching, abd with theraband and bridge; 11/13: HS and calf stretching (says he has been performing this at home)    Consulted and Agree with Plan of Care  Patient       Patient will benefit from skilled therapeutic intervention in order to improve the following deficits and impairments:  Abnormal gait, Decreased balance, Decreased endurance, Decreased mobility, Difficulty walking, Hypomobility, Decreased range of motion, Decreased activity tolerance, Decreased strength, Impaired flexibility  Visit Diagnosis: Other abnormalities of gait and  mobility  Other symptoms and signs involving the musculoskeletal system  Muscle weakness (generalized)     Problem List Patient Active Problem List   Diagnosis Date Noted  . Pseudogout of knee   . Pyogenic arthritis of right knee joint (HCC)   . Falls frequently 05/08/2018  . Fall   . Sleep disturbance   . Post-op pain   . Hypokalemia   . Morbid obesity (HCC)   . Postoperative pain   . DVT, lower extremity, distal, acute, bilateral (HCC)   . Acute blood loss anemia   . Anemia of chronic disease   . Essential hypertension   . Bacteremia   . Myelopathy (HCC) 03/30/2018  . Paraplegia (HCC)   . Postlaminectomy syndrome, lumbar region   . Epidural abscess   . Abdominal distension   . Encephalopathy   . Weakness of both lower extremities   . Discitis of thoracic region   . Spinal cord stimulator status   . Staphylococcus aureus sepsis (HCC) 03/20/2018  . Bacteremia due to methicillin susceptible Staphylococcus aureus (MSSA) 03/18/2018  . Obesity 03/18/2018  . Nephrolithiasis 03/16/2018  . AKI (acute kidney injury) (HCC) 03/16/2018  . Cognitive impairment 03/16/2018  . Chronic pain syndrome 03/16/2018  . B12 deficiency 01/28/2016  . Memory difficulty 07/23/2015  . History of cerebrovascular disease 07/23/2015  . FH: colon cancer 08/13/2014  . Hx of   adenomatous colonic polyps 08/13/2014  . Chronic back pain 05/30/2012  . CAD, NATIVE VESSEL 07/17/2010  . HYPERCHOLESTEROLEMIA 10/31/2009  . SMOKELESS TOBACCO ABUSE 10/31/2009  . Obstructive sleep apnea 10/31/2009  . Essential hypertension, benign 10/31/2009  . GERD 10/31/2009   Kipp Brood, PT, DPT Physical Therapist with [facility]  01/01/2019 5:03 PM    [facility] [address] [place], [place], [address] Phone: [phone]   Fax:  [phone]  Name: Cody Velez MRN: [id number] Date of Birth: [birth year]

## 2019-01-03 ENCOUNTER — Encounter (HOSPITAL_COMMUNITY): Payer: Self-pay

## 2019-01-03 ENCOUNTER — Ambulatory Visit (HOSPITAL_COMMUNITY): Payer: Medicare Other

## 2019-01-03 DIAGNOSIS — R29898 Other symptoms and signs involving the musculoskeletal system: Secondary | ICD-10-CM | POA: Diagnosis not present

## 2019-01-03 DIAGNOSIS — M6281 Muscle weakness (generalized): Secondary | ICD-10-CM | POA: Diagnosis not present

## 2019-01-03 DIAGNOSIS — R2689 Other abnormalities of gait and mobility: Secondary | ICD-10-CM | POA: Diagnosis not present

## 2019-01-03 NOTE — Therapy (Addendum)
[place], [place], [address] Phone: [phone]   Fax:  [phone]   Progress Note Reporting Period 12/14/18 to 01/03/19  See note below for Objective Data and Assessment of Progress/Goals.    Physical Therapy Treatment  Patient Details  Name: NOLYN SWAB MRN: [id number] Date of Birth: March 15, 1946 Referring Provider (PT): Asencion Noble, MD   Encounter Date: 01/03/2019  PT End of Session - 01/03/19 0901    Visit Number  48    Number of Visits  28    Date for PT Re-Evaluation  02/16/19   Progress note complete 11/22/2018   Authorization Type  Primary: Medicare; Secondary: [employer] (75 visits per calendar year)    Authorization Time Period  08/15/18 - 10/14/18; NEW: 10/13/18 to 11/24/18; 11/22/18 - 01/03/19; NEW: 01/03/19 to 02/16/19    Authorization - Visit Number  3   starting for 2020   Authorization - Number of Visits  35    PT Start Time  0900    PT Stop Time  0938    PT Time Calculation (min)  38 min    Equipment Utilized During Treatment  Gait belt    Activity Tolerance  Patient tolerated treatment well    Behavior During Therapy  [facility] for tasks assessed/performed       Past Medical History:  Diagnosis Date  . Allergic rhinitis   . Arthritis   . B12 deficiency   . Cervicogenic headache 01/28/2016  . Childhood asthma   . Chronic back pain   . Coronary atherosclerosis of native coronary artery    Mild nonobstructive CAD at catheterization January 2015  . Depression   . Essential hypertension   . Falls   . GERD (gastroesophageal reflux disease)   . History of cerebrovascular disease 07/23/2015  . History of pneumonia 02/2011  . Hyperlipidemia   . Kidney stone   . Memory difficulty 07/23/2015  . OSA (obstructive sleep apnea)    CPAP - Dr. [name]  . Prostate cancer ([place])   . PTSD (post-traumatic stress disorder)    [place]  . Rectal bleeding     Past Surgical History:  Procedure Laterality Date  .  APPLICATION OF ROBOTIC ASSISTANCE FOR SPINAL PROCEDURE N/A 03/28/2018   Procedure: APPLICATION OF ROBOTIC ASSISTANCE FOR SPINAL PROCEDURE;  Surgeon: Kristeen Miss, MD;  Location: [facility];  Service: Neurosurgery;  Laterality: N/A;  . BACK SURGERY  02/14/12   lumbar OR #7; "today redid L1L2; replaced screws; added bone from hip"  . BILATERAL KNEE ARTHROSCOPY    . COLONOSCOPY  10/15/2008   Dr. [name]: tubular adenoma   . COLONOSCOPY  12/17/2003   HXT:AVWPVX rectal and colon  . COLONOSCOPY N/A 09/05/2014   Procedure: COLONOSCOPY;  Surgeon: Daneil Dolin, MD;  [facility];  Service: Endoscopy;  Laterality: N/A;  7:30-rescheduled 9/17 to [place] notified pt  . CYSTOSCOPY WITH STENT PLACEMENT Right 01/27/2016   Procedure: CYSTOSCOPY WITH STENT PLACEMENT;  Surgeon: [name], MD;  Location: [facility];  Service: Urology;  Laterality: Right;  . CYSTOSCOPY/RETROGRADE/URETEROSCOPY/STONE EXTRACTION WITH BASKET Right 01/27/2016   Procedure: CYSTOSCOPY, RIGHT RETROGRADE, RIGHT URETEROSCOPY, STONE EXTRACTION ;  Surgeon: [name], MD;  Location: [facility];  Service: Urology;  Laterality: Right;  . ESOPHAGOGASTRODUODENOSCOPY  10/15/2008     Dr Rourk:Schatzki's ring status post dilation and disruption via 4 F Maloney dilator/ otherwise unremarkable esophagus, small hiatal hernia, multiple fundal gland polyps not manipulated, gastritis, negative H.pylori  .  ESOPHAGOGASTRODUODENOSCOPY  06/21/02   CZY:SAYTK sliding hiatal hernia with mild changes of reflux esophagitis limited to gastroesophageal junction.  Noncritical ring at distal esophagus, 3 cm proximal to gastroesophageal junction/Antral gastritis  . [place]   "broke face playing softball"  . FRACTURE SURGERY     "left wrist; broke it; took spur off"  . HOLMIUM LASER APPLICATION Right 12/26/107   Procedure: HOLMIUM LASER APPLICATION;  Surgeon: [name], MD;  Location[facility];  Service: Urology;  Laterality:  Right;  . KNEE ARTHROSCOPY Right 05/18/2018   Procedure: PARTIAL MEDIAL MENISECTOMY AND SURGICAL LAVAGE AND CHONDROPLASTY;  Surgeon: [name], MD;  Location: [facility];  Service: Orthopedics;  Laterality: Right;  . LEFT HEART CATHETERIZATION WITH CORONARY ANGIOGRAM N/A 12/27/2013   Procedure: LEFT HEART CATHETERIZATION WITH CORONARY ANGIOGRAM;  Surgeon: [name], MD;  Location: [facility] CATH LAB;  Service: Cardiovascular;  Laterality: N/A;  . neck epidural    . POSTERIOR LUMBAR FUSION 4 LEVEL N/A 03/28/2018   Procedure: Thoracic eight -Lumbar two- FIXATION WITH SCREW PLACEMENT, DECOMPRESSION Thoracic ten-Thoracic eleven  FOR OSTEOMYELITIS;  Surgeon: Kristeen Miss, MD;  Location: [facility];  Service: Neurosurgery;  Laterality: N/A;  . SHOULDER SURGERY Bilateral   . TEE WITHOUT CARDIOVERSION N/A 03/21/2018   Procedure: TRANSESOPHAGEAL ECHOCARDIOGRAM (TEE) WITH PROPOFOL;  Surgeon: [name], MD;  [facility];  Service: Cardiovascular;  Laterality: N/A;    There were no vitals filed for this visit.  Subjective Assessment - 01/03/19 0902    Subjective  Pt states that he is doing well today. His back pain is okay right now; every once in a while it will flare up on him     Pertinent History  T10-11 Epidural Abscess, back surgery on 03/28/18, right knee partial medial menisectomy and chondroplasty    Limitations  Standing;Walking;House hold activities;Lifting    Diagnostic tests  CT Lumbar Spine 05/12/18: "Spinal stimulator device, along with multilevel multisegment fusion hardware appear appropriately placed without adverse features."    Patient Stated Goals  To walk    Currently in Pain?  No/denies           [facility] PT Assessment - 01/03/19 0001      Assessment   Medical Diagnosis  T10-11 Epidural Abscess "Paraplegia"    Referring Provider (PT)  Asencion Noble, MD    Onset Date/Surgical Date  03/28/18   back surgery   Next MD Visit  unsure    Prior Therapy  Yes, inpatient rehab and  home health which patient reported ended 08/04/18      Strength   Right Hip Flexion  4+/5   was 4   Right Hip Extension  3-/5   was 3-   Right Hip ABduction  3-/5   was 3-   Left Hip Flexion  4+/5   was 4+   Left Hip Extension  3-/5   was 3-   Left Hip ABduction  --   was 3-   Right Knee Flexion  4+/5   was 4+   Left Knee Flexion  4+/5   was 4+   Left Knee Extension  5/5   was 4+   Left Ankle Dorsiflexion  5/5   was 4+           PT Education - 01/03/19 0902    Education Details  reassessment findings    Person(s) Educated  Patient    Methods  Explanation;Demonstration    Comprehension  Verbalized understanding;Returned  demonstration       PT Short Term Goals - 01/03/19 0906      PT SHORT TERM GOAL #1   Title  Patient will report understanding and regular compliance with HEP to improve strength and overall functional mobility.     Baseline  1/15: states that he has been doing his HEP more consistently    Time  4    Period  Weeks    Status  Achieved      PT SHORT TERM GOAL #2   Title  Patient will demonstrate improvement of 1/2 MMT strength grade in all muscle groups tested to assist patient with safe transfers, ambulation, and standing.     Baseline  1/15: see MMT    Time  4    Period  Weeks    Status  Partially Met      PT SHORT TERM GOAL #3   Title  Patient will report or demonstrate ability to perform ambulation for 2 minutes with LRAD with no more than supervision assistance without rest break and reported no more than minimal difficulty.     Baseline  1/15: amb 3:20 straight, RW, 2 standing rest breaks due to shoulders    Time  4    Period  Weeks    Status  Partially Met      PT SHORT TERM GOAL #4   Title  Patient will demonstrate ability to ambulate 60 feet without rest break using LRAD with no more than minimal assistance.    Time  2    Period  Weeks    Status  Achieved        PT Long Term Goals - 01/03/19 8315      PT LONG TERM GOAL #1    Title  Patient will report or demonstrate ability to perform ambulation for 5 minutes with LRAD with no more than supervision assistance without rest break and reported no more than minimal difficulty.     Baseline  1/15: 3:20 straight, min A, w/c follow, RW    Time  6    Period  Weeks    Status  On-going      PT LONG TERM GOAL #2   Title  Patient will report ability to stand at a support surface for 10 minutes without a rest break in order to perform household chores with increased ease.     Baseline  1/15: reports he can do this with support surface    Time  6    Period  Weeks    Status  Achieved      PT LONG TERM GOAL #3   Title  Patient will demonstrate ability to ambulate 120 feet without rest break using LRAD with no more than minimal assistance.    Baseline  1/15: amb 97f this date with 2 standing rest breaks, RW, min A    Time  6    Period  Weeks    Status  On-going      PT LONG TERM GOAL #4   Title  Patient will perform 5x sit to stand from standard chair with good control and single UE assist in 16 seconds or less to demonstrate improved LE strength for functional transfers.    Baseline  1/15: from w/c, 1 UE on w/c, 1 UE on RW, 18.3sec    Time  6    Period  Weeks    Status  On-going            Plan -  01/03/19 1133    Clinical Impression Statement  PT reassessed pt's goals and outcome measures this date. Pt continues to make slow, steady progress towards goals as illustrated above. He was able to ambulate 46f in 3:20 with RW, w/c follow, and min A with 2 standing rest breaks for his arms and he performed 5xSTS from his w/c with 1 UE on w/c and 1 UE on RW in 18.3sec. During supine > sit transfer following MMT, pt visibly off balance once seated EOB and required min A to maintain balance as if he were having bout of vertigo but no nystagmus noted and pt reporting he felt like he was going to pass out during that episode. He then became clammy and sweaty; BP checked and it  was 151/97, which is high for him. Allowed pt to rest seated for ~10 mins and he reported feeling a little better, but with a mild HA. Overall, pt is continuing to make progress and needs further skilled PT intervention to address his remaining impairments in order to maximize QOL.     Rehab Potential  Fair    Clinical Impairments Affecting Rehab Potential  Positive: Highly motivated; Negative: Comorbidities    PT Frequency  3x / week    PT Duration  6 weeks    PT Treatment/Interventions  ADLs/Self Care Home Management;Aquatic Therapy;DME Instruction;Gait training;Functional mobility training;Stair training;Therapeutic activities;Therapeutic exercise;Balance training;Neuromuscular re-education;Patient/family education;Orthotic Fit/Training;Wheelchair mobility training;Manual techniques;Passive range of motion;Energy conservation;Dry needling;Splinting;Taping    PT Next Visit Plan  Begin step down training.  Continue manual hip adductor stretch and hamstring stretch. Gait training with RW and wc follow, hurdle step over in // bars and step ups. Progress step up training as able in // bars. Perform gait with obstacle using RW.    PT Home Exercise Plan  08/16/18:  seated DF/PF, LAQ, marching, abd with theraband and bridge; 11/13: HS and calf stretching (says he has been performing this at home)    Consulted and Agree with Plan of Care  Patient       Patient will benefit from skilled therapeutic intervention in order to improve the following deficits and impairments:  Abnormal gait, Decreased balance, Decreased endurance, Decreased mobility, Difficulty walking, Hypomobility, Decreased range of motion, Decreased activity tolerance, Decreased strength, Impaired flexibility  Visit Diagnosis: Other abnormalities of gait and mobility - Plan: PT plan of care cert/re-cert  Other symptoms and signs involving the musculoskeletal system - Plan: PT plan of care cert/re-cert  Muscle weakness (generalized) - Plan:  PT plan of care cert/re-cert     Problem List Patient Active Problem List   Diagnosis Date Noted  . Pseudogout of knee   . Pyogenic arthritis of right knee joint ([place]   . Falls frequently 05/08/2018  . Fall   . Sleep disturbance   . Post-op pain   . Hypokalemia   . Morbid obesity ([place]   . Postoperative pain   . DVT, lower extremity, distal, acute, bilateral ([place]   . Acute blood loss anemia   . Anemia of chronic disease   . Essential hypertension   . Bacteremia   . Myelopathy ([place] 03/30/2018  . Paraplegia ([place]   . Postlaminectomy syndrome, lumbar region   . Epidural abscess   . Abdominal distension   . Encephalopathy   . Weakness of both lower extremities   . Discitis of thoracic region   . Spinal cord stimulator status   . Staphylococcus aureus sepsis ([place] 03/20/2018  . Bacteremia due to methicillin susceptible  Staphylococcus aureus ([facility]) 03/18/2018  . Obesity 03/18/2018  . Nephrolithiasis 03/16/2018  . AKI (acute kidney injury) ([place]) 03/16/2018  . Cognitive impairment 03/16/2018  . Chronic pain syndrome 03/16/2018  . B12 deficiency 01/28/2016  . Memory difficulty 07/23/2015  . History of cerebrovascular disease 07/23/2015  . FH: colon cancer 08/13/2014  . Hx of adenomatous colonic polyps 08/13/2014  . Chronic back pain 05/30/2012  . CAD, NATIVE VESSEL 07/17/2010  . HYPERCHOLESTEROLEMIA 10/31/2009  . SMOKELESS TOBACCO ABUSE 10/31/2009  . Obstructive sleep apnea 10/31/2009  . Essential hypertension, benign 10/31/2009  . GERD 10/31/2009         Geraldine Solar PT, DPT   [facility] [address] [place], [place], [address] Phone: [phone]   Fax:  [phone]  Name: THEODEN MAUCH MRN: [id number] Date of Birth: 07-27-1946

## 2019-01-05 ENCOUNTER — Ambulatory Visit (HOSPITAL_COMMUNITY): Payer: Medicare Other

## 2019-01-05 DIAGNOSIS — R29898 Other symptoms and signs involving the musculoskeletal system: Secondary | ICD-10-CM | POA: Diagnosis not present

## 2019-01-05 DIAGNOSIS — M6281 Muscle weakness (generalized): Secondary | ICD-10-CM

## 2019-01-05 DIAGNOSIS — R2689 Other abnormalities of gait and mobility: Secondary | ICD-10-CM | POA: Diagnosis not present

## 2019-01-05 NOTE — Therapy (Signed)
[place], [place], [address] Phone: [phone]   Fax:  [phone]  Physical Therapy Treatment  Patient Details  Name: Cody Velez MRN: [id number] Date of Birth: 1946/07/30 Referring Provider (PT): Asencion Noble, MD   Encounter Date: 01/05/2019  PT End of Session - 01/05/19 1120    Visit Number  13    Number of Visits  56    Date for PT Re-Evaluation  02/16/19   Progress note complete 11/22/2018   Authorization Type  Primary: Medicare; Secondary: [employer] (75 visits per calendar year)    Authorization Time Period  08/15/18 - 10/14/18; NEW: 10/13/18 to 11/24/18; 11/22/18 - 01/03/19; NEW: 01/03/19 to 02/16/19    Authorization - Visit Number  4   starting for 2020   Authorization - Number of Visits  18    PT Start Time  1121    PT Stop Time  1201    PT Time Calculation (min)  40 min    Equipment Utilized During Treatment  Gait belt    Activity Tolerance  Patient tolerated treatment well    Behavior During Therapy  WFL for tasks assessed/performed       Past Medical History:  Diagnosis Date  . Allergic rhinitis   . Arthritis   . B12 deficiency   . Cervicogenic headache 01/28/2016  . Childhood asthma   . Chronic back pain   . Coronary atherosclerosis of native coronary artery    Mild nonobstructive CAD at catheterization January 2015  . Depression   . Essential hypertension   . Falls   . GERD (gastroesophageal reflux disease)   . History of cerebrovascular disease 07/23/2015  . History of pneumonia 02/2011  . Hyperlipidemia   . Kidney stone   . Memory difficulty 07/23/2015  . OSA (obstructive sleep apnea)    CPAP - Dr. [name]  . Prostate cancer ([place])   . PTSD (post-traumatic stress disorder)    [place]  . Rectal bleeding     Past Surgical History:  Procedure Laterality Date  . APPLICATION OF ROBOTIC ASSISTANCE FOR SPINAL PROCEDURE N/A 03/28/2018   Procedure: APPLICATION OF ROBOTIC ASSISTANCE FOR SPINAL  PROCEDURE;  Surgeon: Kristeen Miss, MD;  Location: [facility];  Service: Neurosurgery;  Laterality: N/A;  . BACK SURGERY  02/14/12   lumbar OR #7; "today redid L1L2; replaced screws; added bone from hip"  . BILATERAL KNEE ARTHROSCOPY    . COLONOSCOPY  10/15/2008   Dr. [name]: tubular adenoma   . COLONOSCOPY  12/17/2003   WKM:QKMMNO rectal and colon  . COLONOSCOPY N/A 09/05/2014   Procedure: COLONOSCOPY;  Surgeon: Daneil Dolin, MD;  [facility];  Service: Endoscopy;  Laterality: N/A;  7:30-rescheduled 9/17 to Sophia notified pt  . CYSTOSCOPY WITH STENT PLACEMENT Right 01/27/2016   Procedure: CYSTOSCOPY WITH STENT PLACEMENT;  Surgeon: [name], MD;  Location: [facility];  Service: Urology;  Laterality: Right;  . CYSTOSCOPY/RETROGRADE/URETEROSCOPY/STONE EXTRACTION WITH BASKET Right 01/27/2016   Procedure: CYSTOSCOPY, RIGHT RETROGRADE, RIGHT URETEROSCOPY, STONE EXTRACTION ;  Surgeon: [name], MD;  Location: [facility];  Service: Urology;  Laterality: Right;  . ESOPHAGOGASTRODUODENOSCOPY  10/15/2008     Dr Rourk:Schatzki's ring status post dilation and disruption via 21 F Maloney dilator/ otherwise unremarkable esophagus, small hiatal hernia, multiple fundal gland polyps not manipulated, gastritis, negative H.pylori  . ESOPHAGOGASTRODUODENOSCOPY  06/21/02   TRR:NHAFB sliding hiatal hernia with mild changes of reflux esophagitis limited to gastroesophageal junction.  Noncritical  ring at distal esophagus, 3 cm proximal to gastroesophageal junction/Antral gastritis  . [place]   "broke face playing softball"  . FRACTURE SURGERY     "left wrist; broke it; took spur off"  . HOLMIUM LASER APPLICATION Right 5/0/3546   Procedure: HOLMIUM LASER APPLICATION;  Surgeon: [name], MD;  Location[facility];  Service: Urology;  Laterality: Right;  . KNEE ARTHROSCOPY Right 05/18/2018   Procedure: PARTIAL MEDIAL MENISECTOMY AND SURGICAL LAVAGE AND CHONDROPLASTY;   Surgeon: [name], MD;  Location: [facility];  Service: Orthopedics;  Laterality: Right;  . LEFT HEART CATHETERIZATION WITH CORONARY ANGIOGRAM N/A 12/27/2013   Procedure: LEFT HEART CATHETERIZATION WITH CORONARY ANGIOGRAM;  Surgeon: [name], MD;  Location: [facility];  Service: Cardiovascular;  Laterality: N/A;  . neck epidural    . POSTERIOR LUMBAR FUSION 4 LEVEL N/A 03/28/2018   Procedure: Thoracic eight -Lumbar two- FIXATION WITH SCREW PLACEMENT, DECOMPRESSION Thoracic ten-Thoracic eleven  FOR OSTEOMYELITIS;  Surgeon: Kristeen Miss, MD;  Location: [facility];  Service: Neurosurgery;  Laterality: N/A;  . SHOULDER SURGERY Bilateral   . TEE WITHOUT CARDIOVERSION N/A 03/21/2018   Procedure: TRANSESOPHAGEAL ECHOCARDIOGRAM (TEE) WITH PROPOFOL;  Surgeon: [name], MD;  [facility];  Service: Cardiovascular;  Laterality: N/A;    There were no vitals filed for this visit.  Subjective Assessment - 01/05/19 1121    Subjective  Pt reports he feels good today. Has only had minor episodes of dizzniess but nothing like the other day.    Pertinent History  T10-11 Epidural Abscess, back surgery on 03/28/18, right knee partial medial menisectomy and chondroplasty    Limitations  Standing;Walking;House hold activities;Lifting    Diagnostic tests  CT Lumbar Spine 05/12/18: "Spinal stimulator device, along with multilevel multisegment fusion hardware appear appropriately placed without adverse features."    Patient Stated Goals  To walk    Currently in Pain?  No/denies                [facility] Adult PT Treatment/Exercise - 01/05/19 0001      Ambulation/Gait   Ambulation/Gait  Yes    Ambulation/Gait Assistance  4: Min guard    Ambulation/Gait Assistance Details  RW, w/c follow    Ambulation Distance (Feet)  150 Feet   2 rest breaks due to bil arm fatigue   Assistive device  Rolling walker    Gait Pattern  Decreased dorsiflexion - left;Decreased dorsiflexion - right;Trunk  flexed;Decreased step length - right;Decreased step length - left;Step-through pattern;Scissoring      Knee/Hip Exercises: Stretches   Passive Hamstring Stretch  Both;3 reps;30 seconds    Passive Hamstring Stretch Limitations  supine manual      Knee/Hip Exercises: Seated   Other Seated Knee/Hip Exercises  on dyna disc with feet on floor and feet elevated +ball toss all directions x5 mins total; on disc feet elevated EC +perturbations all directions x3RT              PT Education - 01/05/19 1120    Education Details  exercise technique    Person(s) Educated  Patient    Methods  Explanation;Demonstration    Comprehension  Verbalized understanding;Returned demonstration       PT Short Term Goals - 01/03/19 0906      PT SHORT TERM GOAL #1   Title  Patient will report understanding and regular compliance with HEP to improve strength and overall functional mobility.     Baseline  1/15: states that he has been doing his HEP more consistently    Time  4    Period  Weeks    Status  Achieved      PT SHORT TERM GOAL #2   Title  Patient will demonstrate improvement of 1/2 MMT strength grade in all muscle groups tested to assist patient with safe transfers, ambulation, and standing.     Baseline  1/15: see MMT    Time  4    Period  Weeks    Status  Partially Met      PT SHORT TERM GOAL #3   Title  Patient will report or demonstrate ability to perform ambulation for 2 minutes with LRAD with no more than supervision assistance without rest break and reported no more than minimal difficulty.     Baseline  1/15: amb 3:20 straight, RW, 2 standing rest breaks due to shoulders    Time  4    Period  Weeks    Status  Partially Met      PT SHORT TERM GOAL #4   Title  Patient will demonstrate ability to ambulate 60 feet without rest break using LRAD with no more than minimal assistance.    Time  2    Period  Weeks    Status  Achieved        PT Long Term Goals - 01/03/19 3976       PT LONG TERM GOAL #1   Title  Patient will report or demonstrate ability to perform ambulation for 5 minutes with LRAD with no more than supervision assistance without rest break and reported no more than minimal difficulty.     Baseline  1/15: 3:20 straight, min A, w/c follow, RW    Time  6    Period  Weeks    Status  On-going      PT LONG TERM GOAL #2   Title  Patient will report ability to stand at a support surface for 10 minutes without a rest break in order to perform household chores with increased ease.     Baseline  1/15: reports he can do this with support surface    Time  6    Period  Weeks    Status  Achieved      PT LONG TERM GOAL #3   Title  Patient will demonstrate ability to ambulate 120 feet without rest break using LRAD with no more than minimal assistance.    Baseline  1/15: amb 41f this date with 2 standing rest breaks, RW, min A    Time  6    Period  Weeks    Status  On-going      PT LONG TERM GOAL #4   Title  Patient will perform 5x sit to stand from standard chair with good control and single UE assist in 16 seconds or less to demonstrate improved LE strength for functional transfers.    Baseline  1/15: from w/c, 1 UE on w/c, 1 UE on RW, 18.3sec    Time  6    Period  Weeks    Status  On-going            Plan - 01/05/19 1223    Clinical Impression Statement  Continued with focus on walking and core stability. Pt able to ambulate 1581fthis date with RW with 2 seated rest breaks due to bil shoulder fatigue. Pt with 2 minor bouts of dizziness today with bed mobility but these  episodes ended very quickly. Ended session sitting on dyna disc with ball toss for dynamic core stability work in order to improve his core strength. Pt tended to lean posteriorly but he was able to correct independently.     Rehab Potential  Fair    Clinical Impairments Affecting Rehab Potential  Positive: Highly motivated; Negative: Comorbidities    PT Frequency  3x / week    PT  Duration  6 weeks    PT Treatment/Interventions  ADLs/Self Care Home Management;Aquatic Therapy;DME Instruction;Gait training;Functional mobility training;Stair training;Therapeutic activities;Therapeutic exercise;Balance training;Neuromuscular re-education;Patient/family education;Orthotic Fit/Training;Wheelchair mobility training;Manual techniques;Passive range of motion;Energy conservation;Dry needling;Splinting;Taping    PT Next Visit Plan  Begin step down training.  Continue manual hip adductor stretch and hamstring stretch. Gait training with RW and wc follow, hurdle step over in // bars and step ups. Progress step up training as able in // bars. Perform gait with obstacle using RW.    PT Home Exercise Plan  08/16/18:  seated DF/PF, LAQ, marching, abd with theraband and bridge; 11/13: HS and calf stretching (says he has been performing this at home)    Consulted and Agree with Plan of Care  Patient       Patient will benefit from skilled therapeutic intervention in order to improve the following deficits and impairments:  Abnormal gait, Decreased balance, Decreased endurance, Decreased mobility, Difficulty walking, Hypomobility, Decreased range of motion, Decreased activity tolerance, Decreased strength, Impaired flexibility  Visit Diagnosis: Other abnormalities of gait and mobility  Other symptoms and signs involving the musculoskeletal system  Muscle weakness (generalized)     Problem List Patient Active Problem List   Diagnosis Date Noted  . Pseudogout of knee   . Pyogenic arthritis of right knee joint ([place])   . Falls frequently 05/08/2018  . Fall   . Sleep disturbance   . Post-op pain   . Hypokalemia   . Morbid obesity ([place])   . Postoperative pain   . DVT, lower extremity, distal, acute, bilateral ([place])   . Acute blood loss anemia   . Anemia of chronic disease   . Essential hypertension   . Bacteremia   . Myelopathy ([place]) 03/30/2018  . Paraplegia ([place])   .  Postlaminectomy syndrome, lumbar region   . Epidural abscess   . Abdominal distension   . Encephalopathy   . Weakness of both lower extremities   . Discitis of thoracic region   . Spinal cord stimulator status   . Staphylococcus aureus sepsis ([place]) 03/20/2018  . Bacteremia due to methicillin susceptible Staphylococcus aureus (MSSA) 03/18/2018  . Obesity 03/18/2018  . Nephrolithiasis 03/16/2018  . AKI (acute kidney injury) ([place]) 03/16/2018  . Cognitive impairment 03/16/2018  . Chronic pain syndrome 03/16/2018  . B12 deficiency 01/28/2016  . Memory difficulty 07/23/2015  . History of cerebrovascular disease 07/23/2015  . FH: colon cancer 08/13/2014  . Hx of adenomatous colonic polyps 08/13/2014  . Chronic back pain 05/30/2012  . CAD, NATIVE VESSEL 07/17/2010  . HYPERCHOLESTEROLEMIA 10/31/2009  . SMOKELESS TOBACCO ABUSE 10/31/2009  . Obstructive sleep apnea 10/31/2009  . Essential hypertension, benign 10/31/2009  . GERD 10/31/2009        Geraldine Solar PT, DPT  [facility] [address] [place], [place], [address] Phone: [phone]   Fax:  [phone]  Name: Cody Velez MRN: [id number] Date of Birth: [birth year]

## 2019-01-08 ENCOUNTER — Encounter (HOSPITAL_COMMUNITY): Payer: Medicare Other

## 2019-01-09 ENCOUNTER — Encounter (HOSPITAL_COMMUNITY): Payer: Self-pay

## 2019-01-09 ENCOUNTER — Ambulatory Visit (HOSPITAL_COMMUNITY): Payer: Medicare Other

## 2019-01-09 DIAGNOSIS — R2689 Other abnormalities of gait and mobility: Secondary | ICD-10-CM | POA: Diagnosis not present

## 2019-01-09 DIAGNOSIS — R29898 Other symptoms and signs involving the musculoskeletal system: Secondary | ICD-10-CM | POA: Diagnosis not present

## 2019-01-09 DIAGNOSIS — M6281 Muscle weakness (generalized): Secondary | ICD-10-CM | POA: Diagnosis not present

## 2019-01-09 NOTE — Therapy (Signed)
[place], [place], [address] Phone: [phone]   Fax:  [phone]  Physical Therapy Treatment  Patient Details  Name: Cody Velez MRN: [id number] Date of Birth: [birth year] Referring Provider (PT): Asencion Noble, MD   Encounter Date: 01/09/2019  PT End of Session - 01/09/19 1024    Visit Number  50    Number of Visits  40    Date for PT Re-Evaluation  02/16/19   Progress note complete 11/22/2018   Authorization Type  Primary: Medicare; Secondary: [employer] (75 visits per calendar year)    Authorization Time Period  08/15/18 - 10/14/18; NEW: 10/13/18 to 11/24/18; 11/22/18 - 01/03/19; NEW: 01/03/19 to 02/16/19    Authorization - Visit Number  5   starting for 2020   Authorization - Number of Visits  69    PT Start Time  1025    PT Stop Time  1105    PT Time Calculation (min)  40 min    Equipment Utilized During Treatment  Gait belt    Activity Tolerance  Patient tolerated treatment well    Behavior During Therapy  WFL for tasks assessed/performed       Past Medical History:  Diagnosis Date  . Allergic rhinitis   . Arthritis   . B12 deficiency   . Cervicogenic headache 01/28/2016  . Childhood asthma   . Chronic back pain   . Coronary atherosclerosis of native coronary artery    Mild nonobstructive CAD at catheterization January 2015  . Depression   . Essential hypertension   . Falls   . GERD (gastroesophageal reflux disease)   . History of cerebrovascular disease 07/23/2015  . History of pneumonia 02/2011  . Hyperlipidemia   . Kidney stone   . Memory difficulty 07/23/2015  . OSA (obstructive sleep apnea)    CPAP - Dr. [name]  . Prostate cancer ([place])   . PTSD (post-traumatic stress disorder)    [place]  . Rectal bleeding     Past Surgical History:  Procedure Laterality Date  . APPLICATION OF ROBOTIC ASSISTANCE FOR SPINAL PROCEDURE N/A 03/28/2018   Procedure: APPLICATION OF ROBOTIC ASSISTANCE FOR SPINAL  PROCEDURE;  Surgeon: Kristeen Miss, MD;  Location: [facility];  Service: Neurosurgery;  Laterality: N/A;  . BACK SURGERY  02/14/12   lumbar OR #7; "today redid L1L2; replaced screws; added bone from hip"  . BILATERAL KNEE ARTHROSCOPY    . COLONOSCOPY  10/15/2008   Dr. [name]: tubular adenoma   . COLONOSCOPY  12/17/2003   [facility]:QQIWLN rectal and colon  . COLONOSCOPY N/A 09/05/2014   Procedure: COLONOSCOPY;  Surgeon: Daneil Dolin, MD;  [facility];  Service: Endoscopy;  Laterality: N/A;  7:30-rescheduled 9/17 to [place] notified pt  . CYSTOSCOPY WITH STENT PLACEMENT Right 01/27/2016   Procedure: CYSTOSCOPY WITH STENT PLACEMENT;  Surgeon: [name], MD;  Location: [facility];  Service: Urology;  Laterality: Right;  . CYSTOSCOPY/RETROGRADE/URETEROSCOPY/STONE EXTRACTION WITH BASKET Right 01/27/2016   Procedure: CYSTOSCOPY, RIGHT RETROGRADE, RIGHT URETEROSCOPY, STONE EXTRACTION ;  Surgeon: [name], MD;  Location: [facility];  Service: Urology;  Laterality: Right;  . ESOPHAGOGASTRODUODENOSCOPY  10/15/2008     Dr Rourk:Schatzki's ring status post dilation and disruption via 40 F Maloney dilator/ otherwise unremarkable esophagus, small hiatal hernia, multiple fundal gland polyps not manipulated, gastritis, negative H.pylori  . ESOPHAGOGASTRODUODENOSCOPY  06/21/02   LGX:QJJHE sliding hiatal hernia with mild changes of reflux esophagitis limited to gastroesophageal junction.  Noncritical  ring at distal esophagus, 3 cm proximal to gastroesophageal junction/Antral gastritis  . [place]   "broke face playing softball"  . FRACTURE SURGERY     "left wrist; broke it; took spur off"  . HOLMIUM LASER APPLICATION Right 12/26/3843   Procedure: HOLMIUM LASER APPLICATION;  Surgeon: [name], MD;  Location: [facility];  Service: Urology;  Laterality: Right;  . KNEE ARTHROSCOPY Right 05/18/2018   Procedure: PARTIAL MEDIAL MENISECTOMY AND SURGICAL LAVAGE AND CHONDROPLASTY;   Surgeon: [name], MD;  Location: [facility];  Service: Orthopedics;  Laterality: Right;  . LEFT HEART CATHETERIZATION WITH CORONARY ANGIOGRAM N/A 12/27/2013   Procedure: LEFT HEART CATHETERIZATION WITH CORONARY ANGIOGRAM;  Surgeon: [name], MD;  Location: [facility];  Service: Cardiovascular;  Laterality: N/A;  . neck epidural    . POSTERIOR LUMBAR FUSION 4 LEVEL N/A 03/28/2018   Procedure: Thoracic eight -Lumbar two- FIXATION WITH SCREW PLACEMENT, DECOMPRESSION Thoracic ten-Thoracic eleven  FOR OSTEOMYELITIS;  Surgeon: Kristeen Miss, MD;  Location: [facility];  Service: Neurosurgery;  Laterality: N/A;  . SHOULDER SURGERY Bilateral   . TEE WITHOUT CARDIOVERSION N/A 03/21/2018   Procedure: TRANSESOPHAGEAL ECHOCARDIOGRAM (TEE) WITH PROPOFOL;  Surgeon: [name], MD;  [facility];  Service: Cardiovascular;  Laterality: N/A;    There were no vitals filed for this visit.  Subjective Assessment - 01/09/19 1114    Subjective  Pt states he feels pretty good today. His head is hurting a little bit. He had oral surgery yesterday.     Pertinent History  T10-11 Epidural Abscess, back surgery on 03/28/18, right knee partial medial menisectomy and chondroplasty    Limitations  Standing;Walking;House hold activities;Lifting    Diagnostic tests  CT Lumbar Spine 05/12/18: "Spinal stimulator device, along with multilevel multisegment fusion hardware appear appropriately placed without adverse features."    Patient Stated Goals  To walk    Currently in Pain?  Yes    Pain Score  2     Pain Location  Head    Pain Orientation  Anterior    Pain Descriptors / Indicators  Headache    Pain Type  Chronic pain    Pain Onset  1 to 4 weeks ago    Pain Frequency  Constant    Aggravating Factors   unsure    Pain Relieving Factors  unsure    Effect of Pain on Daily Activities  pushes through           [facility] Adult PT Treatment/Exercise - 01/09/19 0001      Ambulation/Gait   Ambulation/Gait  Assistance  4: Min guard    Ambulation/Gait Assistance Details  RW, w/c follow    Ambulation Distance (Feet)  240 Feet   60, 100, 33f with RW, w/c follow   Assistive device  Rolling walker    Gait Pattern  Decreased dorsiflexion - left;Decreased dorsiflexion - right;Trunk flexed;Decreased step length - right;Decreased step length - left;Step-through pattern;Scissoring    Gait Comments  one minor LOB in hallway but pt able to maintain balance      Knee/Hip Exercises: Standing   Hip Abduction  Both;2 sets;10 reps    Abduction Limitations  BUE support // bars    Functional Squat  2 sets;10 reps    Functional Squat Limitations  BUE support, // bars, cues for form      Knee/Hip Exercises: Seated   Sit to Sand  10 reps;with UE support   1 UE support on  w/c, no UE once standing to work balance         PT Education - 01/09/19 1024    Education Details  gait, exercise technique    Person(s) Educated  Patient    Methods  Explanation;Demonstration    Comprehension  Verbalized understanding;Returned demonstration       PT Short Term Goals - 01/03/19 0906      PT SHORT TERM GOAL #1   Title  Patient will report understanding and regular compliance with HEP to improve strength and overall functional mobility.     Baseline  1/15: states that he has been doing his HEP more consistently    Time  4    Period  Weeks    Status  Achieved      PT SHORT TERM GOAL #2   Title  Patient will demonstrate improvement of 1/2 MMT strength grade in all muscle groups tested to assist patient with safe transfers, ambulation, and standing.     Baseline  1/15: see MMT    Time  4    Period  Weeks    Status  Partially Met      PT SHORT TERM GOAL #3   Title  Patient will report or demonstrate ability to perform ambulation for 2 minutes with LRAD with no more than supervision assistance without rest break and reported no more than minimal difficulty.     Baseline  1/15: amb 3:20 straight, RW, 2 standing  rest breaks due to shoulders    Time  4    Period  Weeks    Status  Partially Met      PT SHORT TERM GOAL #4   Title  Patient will demonstrate ability to ambulate 60 feet without rest break using LRAD with no more than minimal assistance.    Time  2    Period  Weeks    Status  Achieved        PT Long Term Goals - 01/03/19 3536      PT LONG TERM GOAL #1   Title  Patient will report or demonstrate ability to perform ambulation for 5 minutes with LRAD with no more than supervision assistance without rest break and reported no more than minimal difficulty.     Baseline  1/15: 3:20 straight, min A, w/c follow, RW    Time  6    Period  Weeks    Status  On-going      PT LONG TERM GOAL #2   Title  Patient will report ability to stand at a support surface for 10 minutes without a rest break in order to perform household chores with increased ease.     Baseline  1/15: reports he can do this with support surface    Time  6    Period  Weeks    Status  Achieved      PT LONG TERM GOAL #3   Title  Patient will demonstrate ability to ambulate 120 feet without rest break using LRAD with no more than minimal assistance.    Baseline  1/15: amb 11f this date with 2 standing rest breaks, RW, min A    Time  6    Period  Weeks    Status  On-going      PT LONG TERM GOAL #4   Title  Patient will perform 5x sit to stand from standard chair with good control and single UE assist in 16 seconds or less to demonstrate improved LE strength  for functional transfers.    Baseline  1/15: from w/c, 1 UE on w/c, 1 UE on RW, 18.3sec    Time  6    Period  Weeks    Status  On-going            Plan - 01/09/19 1112    Clinical Impression Statement  Continued with gait at beginning of session; pt continues to demo increased gait deviation with fatigue and was limited from BUE fatigue more so than LEs. Added functional strengthening in // bars with w/c behind for safety. Pt able to perform STS with 1 UE  assist from w/c and then able to maintain balance standing without UE support on // bars but he did report L knee pain with this. Added mini squats with BUE support; cues for form but pt tolerating very well, only having 1 bout of R knee buckle during each set, but no need for assistance to maintain balance. Added hip abd in standing as well to help with hip strength and gait; minor L knee pain due to putting all of weight through it but overall tolerable. Pt reported feeling fatigued at EOS.     Rehab Potential  Fair    Clinical Impairments Affecting Rehab Potential  Positive: Highly motivated; Negative: Comorbidities    PT Frequency  3x / week    PT Duration  6 weeks    PT Treatment/Interventions  ADLs/Self Care Home Management;Aquatic Therapy;DME Instruction;Gait training;Functional mobility training;Stair training;Therapeutic activities;Therapeutic exercise;Balance training;Neuromuscular re-education;Patient/family education;Orthotic Fit/Training;Wheelchair mobility training;Manual techniques;Passive range of motion;Energy conservation;Dry needling;Splinting;Taping    PT Next Visit Plan  continue functional strengthening, manual hip adductor stretch and hamstring stretch. Gait training with RW and wc follow, hurdle step over in // bars and step ups. Progress step up training as able in // bars. Perform gait with obstacle using RW.    PT Home Exercise Plan  08/16/18:  seated DF/PF, LAQ, marching, abd with theraband and bridge; 11/13: HS and calf stretching (says he has been performing this at home)    Consulted and Agree with Plan of Care  Patient       Patient will benefit from skilled therapeutic intervention in order to improve the following deficits and impairments:  Abnormal gait, Decreased balance, Decreased endurance, Decreased mobility, Difficulty walking, Hypomobility, Decreased range of motion, Decreased activity tolerance, Decreased strength, Impaired flexibility  Visit Diagnosis: Other  abnormalities of gait and mobility  Other symptoms and signs involving the musculoskeletal system  Muscle weakness (generalized)     Problem List Patient Active Problem List   Diagnosis Date Noted  . Pseudogout of knee   . Pyogenic arthritis of right knee joint ([place])   . Falls frequently 05/08/2018  . Fall   . Sleep disturbance   . Post-op pain   . Hypokalemia   . Morbid obesity ([place])   . Postoperative pain   . DVT, lower extremity, distal, acute, bilateral ([place])   . Acute blood loss anemia   . Anemia of chronic disease   . Essential hypertension   . Bacteremia   . Myelopathy ([place]) 03/30/2018  . Paraplegia ([place])   . Postlaminectomy syndrome, lumbar region   . Epidural abscess   . Abdominal distension   . Encephalopathy   . Weakness of both lower extremities   . Discitis of thoracic region   . Spinal cord stimulator status   . Staphylococcus aureus sepsis ([place]) 03/20/2018  . Bacteremia due to methicillin susceptible Staphylococcus aureus (MSSA) 03/18/2018  .  Obesity 03/18/2018  . Nephrolithiasis 03/16/2018  . AKI (acute kidney injury) ([place]) 03/16/2018  . Cognitive impairment 03/16/2018  . Chronic pain syndrome 03/16/2018  . B12 deficiency 01/28/2016  . Memory difficulty 07/23/2015  . History of cerebrovascular disease 07/23/2015  . FH: colon cancer 08/13/2014  . Hx of adenomatous colonic polyps 08/13/2014  . Chronic back pain 05/30/2012  . CAD, NATIVE VESSEL 07/17/2010  . HYPERCHOLESTEROLEMIA 10/31/2009  . SMOKELESS TOBACCO ABUSE 10/31/2009  . Obstructive sleep apnea 10/31/2009  . Essential hypertension, benign 10/31/2009  . GERD 10/31/2009       Geraldine Solar PT, DPT  [facility] [address] [place], [place], [address] Phone: [phone]   Fax:  [phone]  Name: Cody Velez MRN: [id number] Date of Birth: December 11, 1946

## 2019-01-10 ENCOUNTER — Ambulatory Visit (HOSPITAL_COMMUNITY): Payer: Medicare Other

## 2019-01-10 ENCOUNTER — Encounter (HOSPITAL_COMMUNITY): Payer: Self-pay

## 2019-01-10 ENCOUNTER — Other Ambulatory Visit: Payer: Self-pay

## 2019-01-10 DIAGNOSIS — R2689 Other abnormalities of gait and mobility: Secondary | ICD-10-CM | POA: Diagnosis not present

## 2019-01-10 DIAGNOSIS — M6281 Muscle weakness (generalized): Secondary | ICD-10-CM | POA: Diagnosis not present

## 2019-01-10 DIAGNOSIS — R29898 Other symptoms and signs involving the musculoskeletal system: Secondary | ICD-10-CM | POA: Diagnosis not present

## 2019-01-10 NOTE — Therapy (Signed)
[place], [place], [address] Phone: [phone]   Fax:  [phone]  Physical Therapy Treatment  Patient Details  Name: Cody Velez MRN: [id number] Date of Birth: Aug 11, 1946 Referring Provider (PT): Asencion Noble, MD   Encounter Date: 01/10/2019  PT End of Session - 01/10/19 1213    Visit Number  51    Number of Visits  39    Date for PT Re-Evaluation  02/16/19   Progress note complete 11/22/2018   Authorization Type  Primary: Medicare; Secondary: [employer] (75 visits per calendar year)    Authorization Time Period  08/15/18 - 10/14/18; NEW: 10/13/18 to 11/24/18; 11/22/18 - 01/03/19; NEW: 01/03/19 to 02/16/19    Authorization - Visit Number  6   starting for 2020   Authorization - Number of Visits  64    PT Start Time  1122    PT Stop Time  1206    PT Time Calculation (min)  44 min    Equipment Utilized During Treatment  Gait belt    Activity Tolerance  Patient tolerated treatment well    Behavior During Therapy  WFL for tasks assessed/performed       Past Medical History:  Diagnosis Date  . Allergic rhinitis   . Arthritis   . B12 deficiency   . Cervicogenic headache 01/28/2016  . Childhood asthma   . Chronic back pain   . Coronary atherosclerosis of native coronary artery    Mild nonobstructive CAD at catheterization January 2015  . Depression   . Essential hypertension   . Falls   . GERD (gastroesophageal reflux disease)   . History of cerebrovascular disease 07/23/2015  . History of pneumonia 02/2011  . Hyperlipidemia   . Kidney stone   . Memory difficulty 07/23/2015  . OSA (obstructive sleep apnea)    CPAP - Dr. [name]  . Prostate cancer ([place])   . PTSD (post-traumatic stress disorder)    [place]  . Rectal bleeding     Past Surgical History:  Procedure Laterality Date  . APPLICATION OF ROBOTIC ASSISTANCE FOR SPINAL PROCEDURE N/A 03/28/2018   Procedure: APPLICATION OF ROBOTIC ASSISTANCE FOR SPINAL  PROCEDURE;  Surgeon: Kristeen Miss, MD;  Location: [facility];  Service: Neurosurgery;  Laterality: N/A;  . BACK SURGERY  02/14/12   lumbar OR #7; "today redid L1L2; replaced screws; added bone from hip"  . BILATERAL KNEE ARTHROSCOPY    . COLONOSCOPY  10/15/2008   Dr. [name]: tubular adenoma   . COLONOSCOPY  12/17/2003   QPR:FFMBWG rectal and colon  . COLONOSCOPY N/A 09/05/2014   Procedure: COLONOSCOPY;  Surgeon: Daneil Dolin, MD;  [facility];  Service: Endoscopy;  Laterality: N/A;  7:30-rescheduled 9/17 to [place] notified pt  . CYSTOSCOPY WITH STENT PLACEMENT Right 01/27/2016   Procedure: CYSTOSCOPY WITH STENT PLACEMENT;  Surgeon: [name], MD;  Location: [facility];  Service: Urology;  Laterality: Right;  . CYSTOSCOPY/RETROGRADE/URETEROSCOPY/STONE EXTRACTION WITH BASKET Right 01/27/2016   Procedure: CYSTOSCOPY, RIGHT RETROGRADE, RIGHT URETEROSCOPY, STONE EXTRACTION ;  Surgeon: [name], MD;  Location: [facility];  Service: Urology;  Laterality: Right;  . ESOPHAGOGASTRODUODENOSCOPY  10/15/2008     Dr Rourk:Schatzki's ring status post dilation and disruption via 74 F Maloney dilator/ otherwise unremarkable esophagus, small hiatal hernia, multiple fundal gland polyps not manipulated, gastritis, negative H.pylori  . ESOPHAGOGASTRODUODENOSCOPY  06/21/02   YKZ:LDJTT sliding hiatal hernia with mild changes of reflux esophagitis limited to gastroesophageal junction.  Noncritical  ring at distal esophagus, 3 cm proximal to gastroesophageal junction/Antral gastritis  . [place]   "broke face playing softball"  . FRACTURE SURGERY     "left wrist; broke it; took spur off"  . HOLMIUM LASER APPLICATION Right 01/27/7866   Procedure: HOLMIUM LASER APPLICATION;  Surgeon: [name], MD;  Location: [facility];  Service: Urology;  Laterality: Right;  . KNEE ARTHROSCOPY Right 05/18/2018   Procedure: PARTIAL MEDIAL MENISECTOMY AND SURGICAL LAVAGE AND CHONDROPLASTY;   Surgeon: [name], MD;  Location: [facility];  Service: Orthopedics;  Laterality: Right;  . LEFT HEART CATHETERIZATION WITH CORONARY ANGIOGRAM N/A 12/27/2013   Procedure: LEFT HEART CATHETERIZATION WITH CORONARY ANGIOGRAM;  Surgeon: [name], MD;  [facility]: [facility];  Service: Cardiovascular;  Laterality: N/A;  . neck epidural    . POSTERIOR LUMBAR FUSION 4 LEVEL N/A 03/28/2018   Procedure: Thoracic eight -Lumbar two- FIXATION WITH SCREW PLACEMENT, DECOMPRESSION Thoracic ten-Thoracic eleven  FOR OSTEOMYELITIS;  Surgeon: Kristeen Miss, MD;  Location: [facility];  Service: Neurosurgery;  Laterality: N/A;  . SHOULDER SURGERY Bilateral   . TEE WITHOUT CARDIOVERSION N/A 03/21/2018   Procedure: TRANSESOPHAGEAL ECHOCARDIOGRAM (TEE) WITH PROPOFOL;  Surgeon: [name], MD;  [facility];  Service: Cardiovascular;  Laterality: N/A;    There were no vitals filed for this visit.  Subjective Assessment - 01/10/19 1214    Subjective  Patient reports he has a headache today and that it is around 4/10. He reports some back pain in mid thoracic spine but states it is not bad.     Pertinent History  T10-11 Epidural Abscess, back surgery on 03/28/18, right knee partial medial menisectomy and chondroplasty    Limitations  Standing;Walking;House hold activities;Lifting    Diagnostic tests  CT Lumbar Spine 05/12/18: "Spinal stimulator device, along with multilevel multisegment fusion hardware appear appropriately placed without adverse features."    Patient Stated Goals  To walk    Currently in Pain?  Yes    Pain Score  4     Pain Location  Head    Pain Orientation  Anterior    Pain Descriptors / Indicators  Aching    Pain Type  Chronic pain    Pain Onset  More than a month ago    Pain Frequency  Constant    Aggravating Factors   unsure    Pain Relieving Factors  unsure    Effect of Pain on Daily Activities  keeps workign through it         [facility] Adult PT Treatment/Exercise - 01/10/19  0001      Ambulation/Gait   Ambulation/Gait Assistance  4: Min guard    Ambulation/Gait Assistance Details  RW and wc follow    Ambulation Distance (Feet)  226 Feet   split into 3 parts   Assistive device  Rolling walker    Gait Pattern  Decreased dorsiflexion - left;Decreased dorsiflexion - right;Trunk flexed;Decreased step length - right;Decreased step length - left;Step-through pattern;Scissoring      Wheelchair Mobility   Comments  Time spent at start of session on wheelchair care and how to tighten brakes. Educated on proper tool and which screws to use to tighten/move brakes      Knee/Hip Exercises: Standing   Heel Raises  Both;3 sets;10 reps    Hip Flexion  Stengthening;Both;3 sets;10 reps;Knee bent    Hip Flexion Limitations  3x 5 reps bil LE marching    Functional Squat  2  sets;10 reps    Functional Squat Limitations  BUE support, // bars, cues for form        PT Education - 01/10/19 1212    Education Details  educated on how to tighten/adjust brakes on wheelchair to care for chair properly.     Person(s) Educated  Patient    Methods  Explanation;Demonstration    Comprehension  Verbalized understanding       PT Short Term Goals - 01/03/19 0906      PT SHORT TERM GOAL #1   Title  Patient will report understanding and regular compliance with HEP to improve strength and overall functional mobility.     Baseline  1/15: states that he has been doing his HEP more consistently    Time  4    Period  Weeks    Status  Achieved      PT SHORT TERM GOAL #2   Title  Patient will demonstrate improvement of 1/2 MMT strength grade in all muscle groups tested to assist patient with safe transfers, ambulation, and standing.     Baseline  1/15: see MMT    Time  4    Period  Weeks    Status  Partially Met      PT SHORT TERM GOAL #3   Title  Patient will report or demonstrate ability to perform ambulation for 2 minutes with LRAD with no more than supervision assistance without rest  break and reported no more than minimal difficulty.     Baseline  1/15: amb 3:20 straight, RW, 2 standing rest breaks due to shoulders    Time  4    Period  Weeks    Status  Partially Met      PT SHORT TERM GOAL #4   Title  Patient will demonstrate ability to ambulate 60 feet without rest break using LRAD with no more than minimal assistance.    Time  2    Period  Weeks    Status  Achieved        PT Long Term Goals - 01/03/19 5093      PT LONG TERM GOAL #1   Title  Patient will report or demonstrate ability to perform ambulation for 5 minutes with LRAD with no more than supervision assistance without rest break and reported no more than minimal difficulty.     Baseline  1/15: 3:20 straight, min A, w/c follow, RW    Time  6    Period  Weeks    Status  On-going      PT LONG TERM GOAL #2   Title  Patient will report ability to stand at a support surface for 10 minutes without a rest break in order to perform household chores with increased ease.     Baseline  1/15: reports he can do this with support surface    Time  6    Period  Weeks    Status  Achieved      PT LONG TERM GOAL #3   Title  Patient will demonstrate ability to ambulate 120 feet without rest break using LRAD with no more than minimal assistance.    Baseline  1/15: amb 102f this date with 2 standing rest breaks, RW, min A    Time  6    Period  Weeks    Status  On-going      PT LONG TERM GOAL #4   Title  Patient will perform 5x sit to stand from standard chair  with good control and single UE assist in 16 seconds or less to demonstrate improved LE strength for functional transfers.    Baseline  1/15: from w/c, 1 UE on w/c, 1 UE on RW, 18.3sec    Time  6    Period  Weeks    Status  On-going        Plan - 01/10/19 1213    Clinical Impression Statement  Start of therapy session spent time on adjusting wheelchair brakes for safety and educating patient on how to adjust brakes himself. Continued with gait training  today and patient is ambulating with more upright posture and improved step pattern with reduced scissoring and equal step length. He continued with functional strengthening in standing with heel raises, squats and marching for LE strengthening. Throughout session standing rest breaks were taken intermittently as opposed to seated, to rest patient's UE's while increasing weight bearing stimulus in LE's. He will continue to benefit from skilled PT interventions to address impairments and progress towards goals for improved independence with functional mobility.    Rehab Potential  Fair    Clinical Impairments Affecting Rehab Potential  Positive: Highly motivated; Negative: Comorbidities    PT Frequency  3x / week    PT Duration  6 weeks    PT Treatment/Interventions  ADLs/Self Care Home Management;Aquatic Therapy;DME Instruction;Gait training;Functional mobility training;Stair training;Therapeutic activities;Therapeutic exercise;Balance training;Neuromuscular re-education;Patient/family education;Orthotic Fit/Training;Wheelchair mobility training;Manual techniques;Passive range of motion;Energy conservation;Dry needling;Splinting;Taping    PT Next Visit Plan  Continue functional strengthening in standing at // bars. Perform manual hip adductor stretch and hamstring stretch, PRN. Gait training with RW and wc follow, hurdle step over in // bars and step ups.     PT Home Exercise Plan  08/16/18:  seated DF/PF, LAQ, marching, abd with theraband and bridge; 11/13: HS and calf stretching (says he has been performing this at home)    Consulted and Agree with Plan of Care  Patient       Patient will benefit from skilled therapeutic intervention in order to improve the following deficits and impairments:  Abnormal gait, Decreased balance, Decreased endurance, Decreased mobility, Difficulty walking, Hypomobility, Decreased range of motion, Decreased activity tolerance, Decreased strength, Impaired  flexibility  Visit Diagnosis: Other abnormalities of gait and mobility  Other symptoms and signs involving the musculoskeletal system  Muscle weakness (generalized)     Problem List Patient Active Problem List   Diagnosis Date Noted  . Pseudogout of knee   . Pyogenic arthritis of right knee joint ([place])   . Falls frequently 05/08/2018  . Fall   . Sleep disturbance   . Post-op pain   . Hypokalemia   . Morbid obesity ([place])   . Postoperative pain   . DVT, lower extremity, distal, acute, bilateral ([place])   . Acute blood loss anemia   . Anemia of chronic disease   . Essential hypertension   . Bacteremia   . Myelopathy ([place]) 03/30/2018  . Paraplegia ([place])   . Postlaminectomy syndrome, lumbar region   . Epidural abscess   . Abdominal distension   . Encephalopathy   . Weakness of both lower extremities   . Discitis of thoracic region   . Spinal cord stimulator status   . Staphylococcus aureus sepsis ([place]) 03/20/2018  . Bacteremia due to methicillin susceptible Staphylococcus aureus (MSSA) 03/18/2018  . Obesity 03/18/2018  . Nephrolithiasis 03/16/2018  . AKI (acute kidney injury) ([place]) 03/16/2018  . Cognitive impairment 03/16/2018  . Chronic pain syndrome 03/16/2018  .  B12 deficiency 01/28/2016  . Memory difficulty 07/23/2015  . History of cerebrovascular disease 07/23/2015  . FH: colon cancer 08/13/2014  . Hx of adenomatous colonic polyps 08/13/2014  . Chronic back pain 05/30/2012  . CAD, NATIVE VESSEL 07/17/2010  . HYPERCHOLESTEROLEMIA 10/31/2009  . SMOKELESS TOBACCO ABUSE 10/31/2009  . Obstructive sleep apnea 10/31/2009  . Essential hypertension, benign 10/31/2009  . GERD 10/31/2009    Kipp Brood, PT, DPT, [facility] Physical Therapist with [facility]  01/10/2019 12:15 PM    [place] [address] [place], [place], [address] Phone: [phone]   Fax:  [phone]  Name: Cody Velez MRN: [id number] Date of Birth: 04/01/1946

## 2019-01-12 ENCOUNTER — Encounter (HOSPITAL_COMMUNITY): Payer: Self-pay

## 2019-01-12 ENCOUNTER — Ambulatory Visit (HOSPITAL_COMMUNITY): Payer: Medicare Other

## 2019-01-12 DIAGNOSIS — M6281 Muscle weakness (generalized): Secondary | ICD-10-CM | POA: Diagnosis not present

## 2019-01-12 DIAGNOSIS — R2689 Other abnormalities of gait and mobility: Secondary | ICD-10-CM | POA: Diagnosis not present

## 2019-01-12 DIAGNOSIS — R29898 Other symptoms and signs involving the musculoskeletal system: Secondary | ICD-10-CM

## 2019-01-12 NOTE — Therapy (Signed)
[place], [place], [address] Phone: [phone]   Fax:  [phone]  Physical Therapy Treatment  Patient Details  Name: Cody Velez MRN: [id number] Date of Birth: 28-May-1946 Referring Provider (PT): Asencion Noble, MD   Encounter Date: 01/12/2019  PT End of Session - 01/12/19 1231    Visit Number  52    Number of Visits  35    Date for PT Re-Evaluation  02/16/19   Progress note 12/03/21/18   Authorization Type  Primary: Medicare; Secondary: [employer] (75 visits per calendar year)    Authorization Time Period  08/15/18 - 10/14/18; NEW: 10/13/18 to 11/24/18; 11/22/18 - 01/03/19; NEW: 01/03/19 to 02/16/19    Authorization - Visit Number  7   Starting for 2020   Authorization - Number of Visits  75    PT Start Time  1120    PT Stop Time  1203    PT Time Calculation (min)  43 min    Equipment Utilized During Treatment  Gait belt    Activity Tolerance  Patient tolerated treatment well    Behavior During Therapy  WFL for tasks assessed/performed       Past Medical History:  Diagnosis Date  . Allergic rhinitis   . Arthritis   . B12 deficiency   . Cervicogenic headache 01/28/2016  . Childhood asthma   . Chronic back pain   . Coronary atherosclerosis of native coronary artery    Mild nonobstructive CAD at catheterization January 2015  . Depression   . Essential hypertension   . Falls   . GERD (gastroesophageal reflux disease)   . History of cerebrovascular disease 07/23/2015  . History of pneumonia 02/2011  . Hyperlipidemia   . Kidney stone   . Memory difficulty 07/23/2015  . OSA (obstructive sleep apnea)    CPAP - Dr. [name]  . Prostate cancer ([place])   . PTSD (post-traumatic stress disorder)    [place]  . Rectal bleeding     Past Surgical History:  Procedure Laterality Date  . APPLICATION OF ROBOTIC ASSISTANCE FOR SPINAL PROCEDURE N/A 03/28/2018   Procedure: APPLICATION OF ROBOTIC ASSISTANCE FOR SPINAL PROCEDURE;   Surgeon: Kristeen Miss, MD;  Location: [facility];  Service: Neurosurgery;  Laterality: N/A;  . BACK SURGERY  02/14/12   lumbar OR #7; "today redid L1L2; replaced screws; added bone from hip"  . BILATERAL KNEE ARTHROSCOPY    . COLONOSCOPY  10/15/2008   Dr. [name]: tubular adenoma   . COLONOSCOPY  12/17/2003   XOV:ANVBTY rectal and colon  . COLONOSCOPY N/A 09/05/2014   Procedure: COLONOSCOPY;  Surgeon: Daneil Dolin, MD;  [facility];  Service: Endoscopy;  Laterality: N/A;  7:30-rescheduled 9/17 to [place] notified pt  . CYSTOSCOPY WITH STENT PLACEMENT Right 01/27/2016   Procedure: CYSTOSCOPY WITH STENT PLACEMENT;  Surgeon: [name], MD;  Location: [facility];  Service: Urology;  Laterality: Right;  . CYSTOSCOPY/RETROGRADE/URETEROSCOPY/STONE EXTRACTION WITH BASKET Right 01/27/2016   Procedure: CYSTOSCOPY, RIGHT RETROGRADE, RIGHT URETEROSCOPY, STONE EXTRACTION ;  Surgeon: [name], MD;  Location: [facility];  Service: Urology;  Laterality: Right;  . ESOPHAGOGASTRODUODENOSCOPY  10/15/2008     Dr Rourk:Schatzki's ring status post dilation and disruption via 72 F Maloney dilator/ otherwise unremarkable esophagus, small hiatal hernia, multiple fundal gland polyps not manipulated, gastritis, negative H.pylori  . ESOPHAGOGASTRODUODENOSCOPY  06/21/02   [facility]:YOKHT sliding hiatal hernia with mild changes of reflux esophagitis limited to gastroesophageal junction.  Noncritical ring  at distal esophagus, 3 cm proximal to gastroesophageal junction/Antral gastritis  . [place]   "broke face playing softball"  . FRACTURE SURGERY     "left wrist; broke it; took spur off"  . HOLMIUM LASER APPLICATION Right 07/21/9561   Procedure: HOLMIUM LASER APPLICATION;  Surgeon: [name], MD;  Location: [facility];  Service: Urology;  Laterality: Right;  . KNEE ARTHROSCOPY Right 05/18/2018   Procedure: PARTIAL MEDIAL MENISECTOMY AND SURGICAL LAVAGE AND CHONDROPLASTY;  Surgeon:  [name], MD;  Location: [facility];  Service: Orthopedics;  Laterality: Right;  . LEFT HEART CATHETERIZATION WITH CORONARY ANGIOGRAM N/A 12/27/2013   Procedure: LEFT HEART CATHETERIZATION WITH CORONARY ANGIOGRAM;  Surgeon: [name], MD;  Location: [facility];  Service: Cardiovascular;  Laterality: N/A;  . neck epidural    . POSTERIOR LUMBAR FUSION 4 LEVEL N/A 03/28/2018   Procedure: Thoracic eight -Lumbar two- FIXATION WITH SCREW PLACEMENT, DECOMPRESSION Thoracic ten-Thoracic eleven  FOR OSTEOMYELITIS;  Surgeon: Kristeen Miss, MD;  Location: [facility];  Service: Neurosurgery;  Laterality: N/A;  . SHOULDER SURGERY Bilateral   . TEE WITHOUT CARDIOVERSION N/A 03/21/2018   Procedure: TRANSESOPHAGEAL ECHOCARDIOGRAM (TEE) WITH PROPOFOL;  Surgeon: [name], MD;  [facility];  Service: Cardiovascular;  Laterality: N/A;    There were no vitals filed for this visit.  Subjective Assessment - 01/12/19 1230    Subjective  Pt stated he feels like "ugh" today, doesnt feel good.  Reports he has a headache as well as mid and lower back pain, 3-4/10 achey pain.    Pertinent History  T10-11 Epidural Abscess, back surgery on 03/28/18, right knee partial medial menisectomy and chondroplasty    Patient Stated Goals  To walk    Currently in Pain?  Yes    Pain Score  4     Pain Location  Back    Pain Orientation  Lower    Pain Descriptors / Indicators  Aching    Pain Type  Chronic pain    Pain Onset  More than a month ago    Pain Frequency  Constant    Aggravating Factors   unsure    Pain Relieving Factors  unsure    Effect of Pain on Daily Activities  keeps working through it                       [facility] Adult PT Treatment/Exercise - 01/12/19 0001      Ambulation/Gait   Ambulation/Gait Assistance  4: Min guard    Ambulation/Gait Assistance Details  RW and WC pushed behind with min A for safety    Ambulation Distance (Feet)  226 Feet   3 sets   Assistive device  Rolling  walker    Gait Pattern  Decreased dorsiflexion - left;Decreased dorsiflexion - right;Trunk flexed;Decreased step length - right;Decreased step length - left;Step-through pattern;Scissoring    Gait Comments  8 hurdles      Knee/Hip Exercises: Stretches   Passive Hamstring Stretch  Both;3 reps;30 seconds    Other Knee/Hip Stretches  Supine adductor stretch BLE 2x 30"    Other Knee/Hip Stretches  Passive Piriformis and SKTC 2x 30"                 PT Short Term Goals - 01/03/19 0906      PT SHORT TERM GOAL #1   Title  Patient will report understanding and regular compliance with HEP to improve strength and  overall functional mobility.     Baseline  1/15: states that he has been doing his HEP more consistently    Time  4    Period  Weeks    Status  Achieved      PT SHORT TERM GOAL #2   Title  Patient will demonstrate improvement of 1/2 MMT strength grade in all muscle groups tested to assist patient with safe transfers, ambulation, and standing.     Baseline  1/15: see MMT    Time  4    Period  Weeks    Status  Partially Met      PT SHORT TERM GOAL #3   Title  Patient will report or demonstrate ability to perform ambulation for 2 minutes with LRAD with no more than supervision assistance without rest break and reported no more than minimal difficulty.     Baseline  1/15: amb 3:20 straight, RW, 2 standing rest breaks due to shoulders    Time  4    Period  Weeks    Status  Partially Met      PT SHORT TERM GOAL #4   Title  Patient will demonstrate ability to ambulate 60 feet without rest break using LRAD with no more than minimal assistance.    Time  2    Period  Weeks    Status  Achieved        PT Long Term Goals - 01/03/19 0865      PT LONG TERM GOAL #1   Title  Patient will report or demonstrate ability to perform ambulation for 5 minutes with LRAD with no more than supervision assistance without rest break and reported no more than minimal difficulty.     Baseline   1/15: 3:20 straight, min A, w/c follow, RW    Time  6    Period  Weeks    Status  On-going      PT LONG TERM GOAL #2   Title  Patient will report ability to stand at a support surface for 10 minutes without a rest break in order to perform household chores with increased ease.     Baseline  1/15: reports he can do this with support surface    Time  6    Period  Weeks    Status  Achieved      PT LONG TERM GOAL #3   Title  Patient will demonstrate ability to ambulate 120 feet without rest break using LRAD with no more than minimal assistance.    Baseline  1/15: amb 75f this date with 2 standing rest breaks, RW, min A    Time  6    Period  Weeks    Status  On-going      PT LONG TERM GOAL #4   Title  Patient will perform 5x sit to stand from standard chair with good control and single UE assist in 16 seconds or less to demonstrate improved LE strength for functional transfers.    Baseline  1/15: from w/c, 1 UE on w/c, 1 UE on RW, 18.3sec    Time  6    Period  Weeks    Status  On-going            Plan - 01/12/19 1232    Clinical Impression Statement  Decreased activity tolerance due to pain this session.  Able to acheive 226 feet gait training with RW and WC behind for safety over 3 sections, increased cueing to decreased scissoring  wiht gait and posture to assist with pain.  Pt is improving hip flexion strengthening with minimal difficulty noted with hurdles today.  EOS with passive LE stretches for mobility to assist with pain control.      Rehab Potential  Fair    Clinical Impairments Affecting Rehab Potential  Positive: Highly motivated; Negative: Comorbidities    PT Frequency  3x / week    PT Duration  6 weeks    PT Treatment/Interventions  ADLs/Self Care Home Management;Aquatic Therapy;DME Instruction;Gait training;Functional mobility training;Stair training;Therapeutic activities;Therapeutic exercise;Balance training;Neuromuscular re-education;Patient/family  education;Orthotic Fit/Training;Wheelchair mobility training;Manual techniques;Passive range of motion;Energy conservation;Dry needling;Splinting;Taping    PT Next Visit Plan  Continue functional strengthening in standing at // bars. Perform manual hip adductor stretch and hamstring stretch, PRN. Gait training with RW and wc follow, hurdle step over in // bars and step ups.     PT Home Exercise Plan  08/16/18:  seated DF/PF, LAQ, marching, abd with theraband and bridge; 11/13: HS and calf stretching (says he has been performing this at home)       Patient will benefit from skilled therapeutic intervention in order to improve the following deficits and impairments:  Abnormal gait, Decreased balance, Decreased endurance, Decreased mobility, Difficulty walking, Hypomobility, Decreased range of motion, Decreased activity tolerance, Decreased strength, Impaired flexibility  Visit Diagnosis: Other abnormalities of gait and mobility  Other symptoms and signs involving the musculoskeletal system  Muscle weakness (generalized)     Problem List Patient Active Problem List   Diagnosis Date Noted  . Pseudogout of knee   . Pyogenic arthritis of right knee joint ([place])   . Falls frequently 05/08/2018  . Fall   . Sleep disturbance   . Post-op pain   . Hypokalemia   . Morbid obesity ([place])   . Postoperative pain   . DVT, lower extremity, distal, acute, bilateral ([place])   . Acute blood loss anemia   . Anemia of chronic disease   . Essential hypertension   . Bacteremia   . Myelopathy ([place]) 03/30/2018  . Paraplegia ([place])   . Postlaminectomy syndrome, lumbar region   . Epidural abscess   . Abdominal distension   . Encephalopathy   . Weakness of both lower extremities   . Discitis of thoracic region   . Spinal cord stimulator status   . Staphylococcus aureus sepsis ([place]) 03/20/2018  . Bacteremia due to methicillin susceptible Staphylococcus aureus (MSSA) 03/18/2018  . Obesity 03/18/2018  .  Nephrolithiasis 03/16/2018  . AKI (acute kidney injury) ([place]) 03/16/2018  . Cognitive impairment 03/16/2018  . Chronic pain syndrome 03/16/2018  . B12 deficiency 01/28/2016  . Memory difficulty 07/23/2015  . History of cerebrovascular disease 07/23/2015  . FH: colon cancer 08/13/2014  . Hx of adenomatous colonic polyps 08/13/2014  . Chronic back pain 05/30/2012  . CAD, NATIVE VESSEL 07/17/2010  . HYPERCHOLESTEROLEMIA 10/31/2009  . SMOKELESS TOBACCO ABUSE 10/31/2009  . Obstructive sleep apnea 10/31/2009  . Essential hypertension, benign 10/31/2009  . GERD 10/31/2009   Ihor Austin, [place]; [place]  Aldona Lento 01/12/2019, 12:41 PM  [facility] [address] [place], [place], [address] Phone: [phone]   Fax:  [phone]  Name: DONYEA BEVERLIN MRN: [id number] Date of Birth: 1946/06/10

## 2019-01-15 ENCOUNTER — Encounter (HOSPITAL_COMMUNITY): Payer: Self-pay

## 2019-01-15 ENCOUNTER — Ambulatory Visit (HOSPITAL_COMMUNITY): Payer: Medicare Other

## 2019-01-15 DIAGNOSIS — R29898 Other symptoms and signs involving the musculoskeletal system: Secondary | ICD-10-CM | POA: Diagnosis not present

## 2019-01-15 DIAGNOSIS — M6281 Muscle weakness (generalized): Secondary | ICD-10-CM | POA: Diagnosis not present

## 2019-01-15 DIAGNOSIS — R2689 Other abnormalities of gait and mobility: Secondary | ICD-10-CM

## 2019-01-15 NOTE — Therapy (Signed)
[place], [place], [address] Phone: [phone]   Fax:  [phone]  Physical Therapy Treatment  Patient Details  Name: Cody Velez MRN: [id number] Date of Birth: 1946-03-26 Referring Provider (PT): Asencion Noble, MD   Encounter Date: 01/15/2019  PT End of Session - 01/15/19 3662    Visit Number  61    Number of Visits  23    Date for PT Re-Evaluation  02/16/19   Progress note 12/03/21/18   Authorization Type  Primary: Medicare; Secondary: [employer] (75 visits per calendar year)    Authorization Time Period  08/15/18 - 10/14/18; NEW: 10/13/18 to 11/24/18; 11/22/18 - 01/03/19; NEW: 01/03/19 to 02/16/19    Authorization - Visit Number  8   Starting for 2020   Authorization - Number of Visits  48    PT Start Time  9476    PT Stop Time  1342    PT Time Calculation (min)  43 min    Equipment Utilized During Treatment  Gait belt    Activity Tolerance  Patient tolerated treatment well    Behavior During Therapy  WFL for tasks assessed/performed       Past Medical History:  Diagnosis Date  . Allergic rhinitis   . Arthritis   . B12 deficiency   . Cervicogenic headache 01/28/2016  . Childhood asthma   . Chronic back pain   . Coronary atherosclerosis of native coronary artery    Mild nonobstructive CAD at catheterization January 2015  . Depression   . Essential hypertension   . Falls   . GERD (gastroesophageal reflux disease)   . History of cerebrovascular disease 07/23/2015  . History of pneumonia 02/2011  . Hyperlipidemia   . Kidney stone   . Memory difficulty 07/23/2015  . OSA (obstructive sleep apnea)    CPAP - Dr. [name]  . Prostate cancer ([place])   . PTSD (post-traumatic stress disorder)    [place]  . Rectal bleeding     Past Surgical History:  Procedure Laterality Date  . APPLICATION OF ROBOTIC ASSISTANCE FOR SPINAL PROCEDURE N/A 03/28/2018   Procedure: APPLICATION OF ROBOTIC ASSISTANCE FOR SPINAL PROCEDURE;   Surgeon: Kristeen Miss, MD;  Location: [facility];  Service: Neurosurgery;  Laterality: N/A;  . BACK SURGERY  02/14/12   lumbar OR #7; "today redid L1L2; replaced screws; added bone from hip"  . BILATERAL KNEE ARTHROSCOPY    . COLONOSCOPY  10/15/2008   Dr. [name]: tubular adenoma   . COLONOSCOPY  12/17/2003   LYY:TKPTWS rectal and colon  . COLONOSCOPY N/A 09/05/2014   Procedure: COLONOSCOPY;  Surgeon: Daneil Dolin, MD;  [facility];  Service: Endoscopy;  Laterality: N/A;  7:30-rescheduled 9/17 to [place] notified pt  . CYSTOSCOPY WITH STENT PLACEMENT Right 01/27/2016   Procedure: CYSTOSCOPY WITH STENT PLACEMENT;  Surgeon: [name], MD;  Location: [facility];  Service: Urology;  Laterality: Right;  . CYSTOSCOPY/RETROGRADE/URETEROSCOPY/STONE EXTRACTION WITH BASKET Right 01/27/2016   Procedure: CYSTOSCOPY, RIGHT RETROGRADE, RIGHT URETEROSCOPY, STONE EXTRACTION ;  Surgeon: [name], MD;  Location: [facility];  Service: Urology;  Laterality: Right;  . ESOPHAGOGASTRODUODENOSCOPY  10/15/2008     Dr Rourk:Schatzki's ring status post dilation and disruption via 38 F Maloney dilator/ otherwise unremarkable esophagus, small hiatal hernia, multiple fundal gland polyps not manipulated, gastritis, negative H.pylori  . ESOPHAGOGASTRODUODENOSCOPY  06/21/02   [facility]:LEXNT sliding hiatal hernia with mild changes of reflux esophagitis limited to gastroesophageal junction.  Noncritical ring  at distal esophagus, 3 cm proximal to gastroesophageal junction/Antral gastritis  . [place]   "broke face playing softball"  . FRACTURE SURGERY     "left wrist; broke it; took spur off"  . HOLMIUM LASER APPLICATION Right 4/0/3474   Procedure: HOLMIUM LASER APPLICATION;  Surgeon: [name], MD;  Location: [facility];  Service: Urology;  Laterality: Right;  . KNEE ARTHROSCOPY Right 05/18/2018   Procedure: PARTIAL MEDIAL MENISECTOMY AND SURGICAL LAVAGE AND CHONDROPLASTY;  Surgeon:  [name], MD;  Location: [facility];  Service: Orthopedics;  Laterality: Right;  . LEFT HEART CATHETERIZATION WITH CORONARY ANGIOGRAM N/A 12/27/2013   Procedure: LEFT HEART CATHETERIZATION WITH CORONARY ANGIOGRAM;  Surgeon: [name], MD;  Location[facility] C[facility];  Service: Cardiovascular;  Laterality: N/A;  . neck epidural    . POSTERIOR LUMBAR FUSION 4 LEVEL N/A 03/28/2018   Procedure: Thoracic eight -Lumbar two- FIXATION WITH SCREW PLACEMENT, DECOMPRESSION Thoracic ten-Thoracic eleven  FOR OSTEOMYELITIS;  Surgeon: Kristeen Miss, MD;  Location: [facility];  Service: Neurosurgery;  Laterality: N/A;  . SHOULDER SURGERY Bilateral   . TEE WITHOUT CARDIOVERSION N/A 03/21/2018   Procedure: TRANSESOPHAGEAL ECHOCARDIOGRAM (TEE) WITH PROPOFOL;  Surgeon: [name], MD;  [facility];  Service: Cardiovascular;  Laterality: N/A;    There were no vitals filed for this visit.  Subjective Assessment - 01/15/19 1621    Subjective  Pt reports that he is doing fine today except for his middle back.     Pertinent History  T10-11 Epidural Abscess, back surgery on 03/28/18, right knee partial medial menisectomy and chondroplasty    Patient Stated Goals  To walk    Currently in Pain?  Yes    Pain Score  4     Pain Location  Back    Pain Orientation  Mid    Pain Descriptors / Indicators  Aching    Pain Type  Chronic pain    Pain Onset  More than a month ago    Pain Frequency  Constant    Aggravating Factors   unsure    Pain Relieving Factors  unsure    Effect of Pain on Daily Activities  keeps working thru it             [facility] Adult PT Treatment/Exercise - 01/15/19 0001      Ambulation/Gait   Ambulation/Gait Assistance  4: Min guard;5: Supervision    Ambulation/Gait Assistance Details  RW, w/c follow    Ambulation Distance (Feet)  310 Feet   3 seated rest breaks intermittently   Assistive device  Rolling walker    Gait Pattern  Decreased dorsiflexion - left;Decreased dorsiflexion -  right;Trunk flexed;Decreased step length - right;Decreased step length - left;Step-through pattern;Scissoring      Knee/Hip Exercises: Standing   Hip Flexion  Both;10 reps    Hip Flexion Limitations  BUE support; attemtped with 1 UE support but pt unable due to weakness and unsteadiness in BLE    Functional Squat  2 sets;10 reps    Functional Squat Limitations  BUE support, // bars, cues for form; 1st rep of 1st set pt's bil knees buckled, he lowered self into chair           PT Education - 01/15/19 1621    Education Details  exercise technique    Person(s) Educated  Patient    Methods  Explanation;Demonstration    Comprehension  Verbalized understanding;Returned demonstration       PT  Short Term Goals - 01/03/19 0906      PT SHORT TERM GOAL #1   Title  Patient will report understanding and regular compliance with HEP to improve strength and overall functional mobility.     Baseline  1/15: states that he has been doing his HEP more consistently    Time  4    Period  Weeks    Status  Achieved      PT SHORT TERM GOAL #2   Title  Patient will demonstrate improvement of 1/2 MMT strength grade in all muscle groups tested to assist patient with safe transfers, ambulation, and standing.     Baseline  1/15: see MMT    Time  4    Period  Weeks    Status  Partially Met      PT SHORT TERM GOAL #3   Title  Patient will report or demonstrate ability to perform ambulation for 2 minutes with LRAD with no more than supervision assistance without rest break and reported no more than minimal difficulty.     Baseline  1/15: amb 3:20 straight, RW, 2 standing rest breaks due to shoulders    Time  4    Period  Weeks    Status  Partially Met      PT SHORT TERM GOAL #4   Title  Patient will demonstrate ability to ambulate 60 feet without rest break using LRAD with no more than minimal assistance.    Time  2    Period  Weeks    Status  Achieved        PT Long Term Goals - 01/03/19 2952       PT LONG TERM GOAL #1   Title  Patient will report or demonstrate ability to perform ambulation for 5 minutes with LRAD with no more than supervision assistance without rest break and reported no more than minimal difficulty.     Baseline  1/15: 3:20 straight, min A, w/c follow, RW    Time  6    Period  Weeks    Status  On-going      PT LONG TERM GOAL #2   Title  Patient will report ability to stand at a support surface for 10 minutes without a rest break in order to perform household chores with increased ease.     Baseline  1/15: reports he can do this with support surface    Time  6    Period  Weeks    Status  Achieved      PT LONG TERM GOAL #3   Title  Patient will demonstrate ability to ambulate 120 feet without rest break using LRAD with no more than minimal assistance.    Baseline  1/15: amb 57f this date with 2 standing rest breaks, RW, min A    Time  6    Period  Weeks    Status  On-going      PT LONG TERM GOAL #4   Title  Patient will perform 5x sit to stand from standard chair with good control and single UE assist in 16 seconds or less to demonstrate improved LE strength for functional transfers.    Baseline  1/15: from w/c, 1 UE on w/c, 1 UE on RW, 18.3sec    Time  6    Period  Weeks    Status  On-going            Plan - 01/15/19 1622    Clinical Impression  Statement  Pt tolerating increased gait distance this date with 3 seated rest breaks during; reduced L knee buckling noted and overall better control throughout. Continued with functional strengthening in the // bars. During 1st rep in first set of squats, pt's bil knees buckled and he had to lower himself into his w/c which was directly behind him the entire time. Rest of squats, pt did not go down quite as low and his knees did not buckle anymore. Attempted to perform marching with only 1UE assist but due to BLE weakness and unsteadiness, pt unable to do so. Continue as planned, progressing as able.      Rehab Potential  Fair    Clinical Impairments Affecting Rehab Potential  Positive: Highly motivated; Negative: Comorbidities    PT Frequency  3x / week    PT Duration  6 weeks    PT Treatment/Interventions  ADLs/Self Care Home Management;Aquatic Therapy;DME Instruction;Gait training;Functional mobility training;Stair training;Therapeutic activities;Therapeutic exercise;Balance training;Neuromuscular re-education;Patient/family education;Orthotic Fit/Training;Wheelchair mobility training;Manual techniques;Passive range of motion;Energy conservation;Dry needling;Splinting;Taping    PT Next Visit Plan  Continue functional strengthening in standing at // bars. Perform manual hip adductor stretch and hamstring stretch, PRN. Gait training with RW and wc follow, hurdle step over in // bars and step ups.     PT Home Exercise Plan  08/16/18:  seated DF/PF, LAQ, marching, abd with theraband and bridge; 11/13: HS and calf stretching (says he has been performing this at home)    Consulted and Agree with Plan of Care  Patient       Patient will benefit from skilled therapeutic intervention in order to improve the following deficits and impairments:  Abnormal gait, Decreased balance, Decreased endurance, Decreased mobility, Difficulty walking, Hypomobility, Decreased range of motion, Decreased activity tolerance, Decreased strength, Impaired flexibility  Visit Diagnosis: Other abnormalities of gait and mobility  Other symptoms and signs involving the musculoskeletal system  Muscle weakness (generalized)     Problem List Patient Active Problem List   Diagnosis Date Noted  . Pseudogout of knee   . Pyogenic arthritis of right knee joint ([place])   . Falls frequently 05/08/2018  . Fall   . Sleep disturbance   . Post-op pain   . Hypokalemia   . Morbid obesity ([place])   . Postoperative pain   . DVT, lower extremity, distal, acute, bilateral ([place])   . Acute blood loss anemia   . Anemia of chronic disease    . Essential hypertension   . Bacteremia   . Myelopathy ([place]) 03/30/2018  . Paraplegia ([place])   . Postlaminectomy syndrome, lumbar region   . Epidural abscess   . Abdominal distension   . Encephalopathy   . Weakness of both lower extremities   . Discitis of thoracic region   . Spinal cord stimulator status   . Staphylococcus aureus sepsis ([place]) 03/20/2018  . Bacteremia due to methicillin susceptible Staphylococcus aureus (MSSA) 03/18/2018  . Obesity 03/18/2018  . Nephrolithiasis 03/16/2018  . AKI (acute kidney injury) ([place]) 03/16/2018  . Cognitive impairment 03/16/2018  . Chronic pain syndrome 03/16/2018  . B12 deficiency 01/28/2016  . Memory difficulty 07/23/2015  . History of cerebrovascular disease 07/23/2015  . FH: colon cancer 08/13/2014  . Hx of adenomatous colonic polyps 08/13/2014  . Chronic back pain 05/30/2012  . CAD, NATIVE VESSEL 07/17/2010  . HYPERCHOLESTEROLEMIA 10/31/2009  . SMOKELESS TOBACCO ABUSE 10/31/2009  . Obstructive sleep apnea 10/31/2009  . Essential hypertension, benign 10/31/2009  . GERD 10/31/2009  Geraldine Solar PT, [place] [address] [place], [place], [address] Phone: [phone]   Fax:  [phone]  Name: Cody Velez MRN: [id number] Date of Birth: [birth year]

## 2019-01-17 ENCOUNTER — Ambulatory Visit (HOSPITAL_COMMUNITY): Payer: Medicare Other

## 2019-01-17 ENCOUNTER — Encounter (HOSPITAL_COMMUNITY): Payer: Self-pay

## 2019-01-17 ENCOUNTER — Other Ambulatory Visit: Payer: Self-pay

## 2019-01-17 DIAGNOSIS — R29898 Other symptoms and signs involving the musculoskeletal system: Secondary | ICD-10-CM

## 2019-01-17 DIAGNOSIS — R2689 Other abnormalities of gait and mobility: Secondary | ICD-10-CM | POA: Diagnosis not present

## 2019-01-17 DIAGNOSIS — M6281 Muscle weakness (generalized): Secondary | ICD-10-CM

## 2019-01-17 NOTE — Therapy (Signed)
[place], [place], [address] Phone: [phone]   Fax:  [phone]  Physical Therapy Treatment  Patient Details  Name: Cody Velez MRN: [id number] Date of Birth: 1946/03/10 Referring Provider (PT): Asencion Noble, MD   Encounter Date: 01/17/2019  PT End of Session - 01/17/19 1217    Visit Number  67    Number of Visits  52    Date for PT Re-Evaluation  02/16/19   Progress note 12/03/21/18   Authorization Type  Primary: Medicare; Secondary: BCBS (75 visits per calendar year)    Authorization Time Period  08/15/18 - 10/14/18; NEW: 10/13/18 to 11/24/18; 11/22/18 - 01/03/19; NEW: 01/03/19 to 02/16/19    Authorization - Visit Number  9   Starting for 2020   Authorization - Number of Visits  51    PT Start Time  1122    PT Stop Time  1201    PT Time Calculation (min)  39 min    Equipment Utilized During Treatment  Gait belt    Activity Tolerance  Patient tolerated treatment well    Behavior During Therapy  WFL for tasks assessed/performed       Past Medical History:  Diagnosis Date  . Allergic rhinitis   . Arthritis   . B12 deficiency   . Cervicogenic headache 01/28/2016  . Childhood asthma   . Chronic back pain   . Coronary atherosclerosis of native coronary artery    Mild nonobstructive CAD at catheterization January 2015  . Depression   . Essential hypertension   . Falls   . GERD (gastroesophageal reflux disease)   . History of cerebrovascular disease 07/23/2015  . History of pneumonia 02/2011  . Hyperlipidemia   . Kidney stone   . Memory difficulty 07/23/2015  . OSA (obstructive sleep apnea)    CPAP - Dr. [name]  . Prostate cancer ([place])   . PTSD (post-traumatic stress disorder)    [place]  . Rectal bleeding     Past Surgical History:  Procedure Laterality Date  . APPLICATION OF ROBOTIC ASSISTANCE FOR SPINAL PROCEDURE N/A 03/28/2018   Procedure: APPLICATION OF ROBOTIC ASSISTANCE FOR SPINAL PROCEDURE;   Surgeon: Kristeen Miss, MD;  Location: [facility];  Service: Neurosurgery;  Laterality: N/A;  . BACK SURGERY  02/14/12   lumbar OR #7; "today redid L1L2; replaced screws; added bone from hip"  . BILATERAL KNEE ARTHROSCOPY    . COLONOSCOPY  10/15/2008   Dr. [name]: tubular adenoma   . COLONOSCOPY  12/17/2003   FMB:[id number] rectal and colon  . COLONOSCOPY N/A 09/05/2014   Procedure: COLONOSCOPY;  Surgeon: Daneil Dolin, MD;  [facility];  Service: Endoscopy;  Laterality: N/A;  7:30-rescheduled 9/17 to [place] notified pt  . CYSTOSCOPY WITH STENT PLACEMENT Right 01/27/2016   Procedure: CYSTOSCOPY WITH STENT PLACEMENT;  Surgeon: [name], MD;  Location: [facility];  Service: Urology;  Laterality: Right;  . CYSTOSCOPY/RETROGRADE/URETEROSCOPY/STONE EXTRACTION WITH BASKET Right 01/27/2016   Procedure: CYSTOSCOPY, RIGHT RETROGRADE, RIGHT URETEROSCOPY, STONE EXTRACTION ;  Surgeon: [name], MD;  Location: [facility];  Service: Urology;  Laterality: Right;  . ESOPHAGOGASTRODUODENOSCOPY  10/15/2008     Dr Rourk:Schatzki's ring status post dilation and disruption via 34 F Maloney dilator/ otherwise unremarkable esophagus, small hiatal hernia, multiple fundal gland polyps not manipulated, gastritis, negative H.pylori  . ESOPHAGOGASTRODUODENOSCOPY  06/21/02   DJT:TSVXB sliding hiatal hernia with mild changes of reflux esophagitis limited to gastroesophageal junction.  Noncritical ring  at distal esophagus, 3 cm proximal to gastroesophageal junction/Antral gastritis  . [place]   "broke face playing softball"  . FRACTURE SURGERY     "left wrist; broke it; took spur off"  . HOLMIUM LASER APPLICATION Right 07/24/6313   Procedure: HOLMIUM LASER APPLICATION;  Surgeon: [name], MD;  Location[facility];  Service: Urology;  Laterality: Right;  . KNEE ARTHROSCOPY Right 05/18/2018   Procedure: PARTIAL MEDIAL MENISECTOMY AND SURGICAL LAVAGE AND CHONDROPLASTY;  Surgeon:  [name], MD;  Location: [facility];  Service: Orthopedics;  Laterality: Right;  . LEFT HEART CATHETERIZATION WITH CORONARY ANGIOGRAM N/A 12/27/2013   Procedure: LEFT HEART CATHETERIZATION WITH CORONARY ANGIOGRAM;  Surgeon: [name], MD;  Location: [facility];  Service: Cardiovascular;  Laterality: N/A;  . neck epidural    . POSTERIOR LUMBAR FUSION 4 LEVEL N/A 03/28/2018   Procedure: Thoracic eight -Lumbar two- FIXATION WITH SCREW PLACEMENT, DECOMPRESSION Thoracic ten-Thoracic eleven  FOR OSTEOMYELITIS;  Surgeon: Kristeen Miss, MD;  Location: [facility];  Service: Neurosurgery;  Laterality: N/A;  . SHOULDER SURGERY Bilateral   . TEE WITHOUT CARDIOVERSION N/A 03/21/2018   Procedure: TRANSESOPHAGEAL ECHOCARDIOGRAM (TEE) WITH PROPOFOL;  Surgeon: [name], MD;  [facility];  Service: Cardiovascular;  Laterality: N/A;    There were no vitals filed for this visit.  Subjective Assessment - 01/17/19 1159    Subjective  Patient reports he is doing well and eager to start back in the pool next week. He reports his back feels ok today and denies pain.     Pertinent History  T10-11 Epidural Abscess, back surgery on 03/28/18, right knee partial medial menisectomy and chondroplasty    Limitations  Standing;Walking;House hold activities;Lifting    Diagnostic tests  CT Lumbar Spine 05/12/18: "Spinal stimulator device, along with multilevel multisegment fusion hardware appear appropriately placed without adverse features."    Patient Stated Goals  To walk    Currently in Pain?  No/denies       [facility] Adult PT Treatment/Exercise - 01/17/19 0001      Ambulation/Gait   Ambulation/Gait Assistance  4: Min guard;5: Supervision    Ambulation/Gait Assistance Details  W/c follow for safety, cues for posture    Ambulation Distance (Feet)  190 Feet   3 bouts   Assistive device  Rolling walker    Gait Pattern  Decreased dorsiflexion - left;Decreased dorsiflexion - right;Trunk flexed;Decreased step  length - right;Decreased step length - left;Step-through pattern;Scissoring      Knee/Hip Exercises: Standing   Heel Raises  Both;1 set;15 reps;3 seconds    Hip Flexion  Both;2 sets;10 reps    Hip Flexion Limitations  Unilateral UE support (ipsilateral UE to leg marching to increase BOS) low marchign bil LE's in // bars    Forward Step Up  Both;Hand Hold: 2;Step Height: 4";1 set;10 reps    Forward Step Up Limitations  step taps, 4" step,    Functional Squat  2 sets;10 reps    Functional Squat Limitations  bil UE support in // bars, no buckling in first set, 3 episode ofbuckling in 2nd set       PT Education - 01/17/19 1252    Education Details  Reviewed importance of HEP participation. Educated on UE support to improve BOS for balance/functional strengthening.     Person(s) Educated  Patient    Methods  Explanation    Comprehension  Verbalized understanding;Returned demonstration       PT Short Term  Goals - 01/03/19 0906      PT SHORT TERM GOAL #1   Title  Patient will report understanding and regular compliance with HEP to improve strength and overall functional mobility.     Baseline  1/15: states that he has been doing his HEP more consistently    Time  4    Period  Weeks    Status  Achieved      PT SHORT TERM GOAL #2   Title  Patient will demonstrate improvement of 1/2 MMT strength grade in all muscle groups tested to assist patient with safe transfers, ambulation, and standing.     Baseline  1/15: see MMT    Time  4    Period  Weeks    Status  Partially Met      PT SHORT TERM GOAL #3   Title  Patient will report or demonstrate ability to perform ambulation for 2 minutes with LRAD with no more than supervision assistance without rest break and reported no more than minimal difficulty.     Baseline  1/15: amb 3:20 straight, RW, 2 standing rest breaks due to shoulders    Time  4    Period  Weeks    Status  Partially Met      PT SHORT TERM GOAL #4   Title  Patient will  demonstrate ability to ambulate 60 feet without rest break using LRAD with no more than minimal assistance.    Time  2    Period  Weeks    Status  Achieved        PT Long Term Goals - 01/03/19 3710      PT LONG TERM GOAL #1   Title  Patient will report or demonstrate ability to perform ambulation for 5 minutes with LRAD with no more than supervision assistance without rest break and reported no more than minimal difficulty.     Baseline  1/15: 3:20 straight, min A, w/c follow, RW    Time  6    Period  Weeks    Status  On-going      PT LONG TERM GOAL #2   Title  Patient will report ability to stand at a support surface for 10 minutes without a rest break in order to perform household chores with increased ease.     Baseline  1/15: reports he can do this with support surface    Time  6    Period  Weeks    Status  Achieved      PT LONG TERM GOAL #3   Title  Patient will demonstrate ability to ambulate 120 feet without rest break using LRAD with no more than minimal assistance.    Baseline  1/15: amb 36f this date with 2 standing rest breaks, RW, min A    Time  6    Period  Weeks    Status  On-going      PT LONG TERM GOAL #4   Title  Patient will perform 5x sit to stand from standard chair with good control and single UE assist in 16 seconds or less to demonstrate improved LE strength for functional transfers.    Baseline  1/15: from w/c, 1 UE on w/c, 1 UE on RW, 18.3sec    Time  6    Period  Weeks    Status  On-going         Plan - 01/17/19 1159    Clinical Impression Statement  Session began with  walking for LE strengthening and endurance, verbal cues provided for posture throughout and intermittent standing and rest breaks taken throughout. Patient continued with functional LE strengthening in standing and progressed marching today to stair taps. He demonstrated improved control and endurance today with ability to march bil LE with single UE support. He had Rt>Lt knee  buckling during second set of marching. He will continue to  Benefit from skilled PT interventions to improve functional strength and mobility.     Rehab Potential  Fair    Clinical Impairments Affecting Rehab Potential  Positive: Highly motivated; Negative: Comorbidities    PT Frequency  3x / week    PT Duration  6 weeks    PT Treatment/Interventions  ADLs/Self Care Home Management;Aquatic Therapy;DME Instruction;Gait training;Functional mobility training;Stair training;Therapeutic activities;Therapeutic exercise;Balance training;Neuromuscular re-education;Patient/family education;Orthotic Fit/Training;Wheelchair mobility training;Manual techniques;Passive range of motion;Energy conservation;Dry needling;Splinting;Taping    PT Next Visit Plan  Continue functional strengthening in standing at // bars. Perform manual hip adductor stretch and hamstring stretch, PRN. Review importance of strengthening at home and of stretching for improve posture and gait quality. Gait training with RW and wc follow, hurdle step over in // bars and step ups.     PT Home Exercise Plan  08/16/18:  seated DF/PF, LAQ, marching, abd with theraband and bridge; 11/13: HS and calf stretching (says he has been performing this at home)    Consulted and Agree with Plan of Care  Patient       Patient will benefit from skilled therapeutic intervention in order to improve the following deficits and impairments:  Abnormal gait, Decreased balance, Decreased endurance, Decreased mobility, Difficulty walking, Hypomobility, Decreased range of motion, Decreased activity tolerance, Decreased strength, Impaired flexibility  Visit Diagnosis: Other abnormalities of gait and mobility  Other symptoms and signs involving the musculoskeletal system  Muscle weakness (generalized)     Problem List Patient Active Problem List   Diagnosis Date Noted  . Pseudogout of knee   . Pyogenic arthritis of right knee joint ([place])   . Falls frequently  05/08/2018  . Fall   . Sleep disturbance   . Post-op pain   . Hypokalemia   . Morbid obesity ([place])   . Postoperative pain   . DVT, lower extremity, distal, acute, bilateral ([place])   . Acute blood loss anemia   . Anemia of chronic disease   . Essential hypertension   . Bacteremia   . Myelopathy ([place]) 03/30/2018  . Paraplegia ([place])   . Postlaminectomy syndrome, lumbar region   . Epidural abscess   . Abdominal distension   . Encephalopathy   . Weakness of both lower extremities   . Discitis of thoracic region   . Spinal cord stimulator status   . Staphylococcus aureus sepsis ([place]) 03/20/2018  . Bacteremia due to methicillin susceptible Staphylococcus aureus (MSSA) 03/18/2018  . Obesity 03/18/2018  . Nephrolithiasis 03/16/2018  . AKI (acute kidney injury) ([place]) 03/16/2018  . Cognitive impairment 03/16/2018  . Chronic pain syndrome 03/16/2018  . B12 deficiency 01/28/2016  . Memory difficulty 07/23/2015  . History of cerebrovascular disease 07/23/2015  . FH: colon cancer 08/13/2014  . Hx of adenomatous colonic polyps 08/13/2014  . Chronic back pain 05/30/2012  . CAD, NATIVE VESSEL 07/17/2010  . HYPERCHOLESTEROLEMIA 10/31/2009  . SMOKELESS TOBACCO ABUSE 10/31/2009  . Obstructive sleep apnea 10/31/2009  . Essential hypertension, benign 10/31/2009  . GERD 10/31/2009    Kipp Brood, PT, DPT, [facility] Physical Therapist with [facility]  01/17/2019  12:52 PM    [place], [place], [address] Phone: [phone]   Fax:  [phone]  Name: WILMA WUTHRICH MRN: [id number] Date of Birth: Sep 10, 1946

## 2019-01-18 ENCOUNTER — Ambulatory Visit (INDEPENDENT_AMBULATORY_CARE_PROVIDER_SITE_OTHER): Payer: Medicare Other | Admitting: Internal Medicine

## 2019-01-18 ENCOUNTER — Encounter: Payer: Self-pay | Admitting: Internal Medicine

## 2019-01-18 DIAGNOSIS — M4644 Discitis, unspecified, thoracic region: Secondary | ICD-10-CM | POA: Diagnosis not present

## 2019-01-18 NOTE — Assessment & Plan Note (Signed)
I suspect that his recent intermittently increased back pain may be due to increased activity rather than any worsening infection.  I think that there is a good chance that his infection has been cured but he is very worried about stopping antibiotics.  I suggested repeating his inflammatory markers and having him stop rifampin.  He will continue cephalexin alone and follow-up in 2 months.

## 2019-01-18 NOTE — Progress Notes (Signed)
[facility] for Infectious Disease  Patient Active Problem List   Diagnosis Date Noted  . Pseudogout of knee     Priority: High  . Discitis of thoracic region     Priority: High  . Bacteremia due to methicillin susceptible Staphylococcus aureus (MSSA) 03/18/2018    Priority: High  . Pyogenic arthritis of right knee joint ([place])   . Falls frequently 05/08/2018  . Fall   . Sleep disturbance   . Post-op pain   . Hypokalemia   . Morbid obesity ([place])   . Postoperative pain   . DVT, lower extremity, distal, acute, bilateral ([place])   . Acute blood loss anemia   . Anemia of chronic disease   . Essential hypertension   . Bacteremia   . Myelopathy ([place]) 03/30/2018  . Paraplegia ([place])   . Postlaminectomy syndrome, lumbar region   . Epidural abscess   . Abdominal distension   . Encephalopathy   . Weakness of both lower extremities   . Spinal cord stimulator status   . Staphylococcus aureus sepsis ([place]) 03/20/2018  . Obesity 03/18/2018  . Nephrolithiasis 03/16/2018  . AKI (acute kidney injury) ([place]) 03/16/2018  . Cognitive impairment 03/16/2018  . Chronic pain syndrome 03/16/2018  . B12 deficiency 01/28/2016  . Memory difficulty 07/23/2015  . History of cerebrovascular disease 07/23/2015  . FH: colon cancer 08/13/2014  . Hx of adenomatous colonic polyps 08/13/2014  . Chronic back pain 05/30/2012  . CAD, NATIVE VESSEL 07/17/2010  . HYPERCHOLESTEROLEMIA 10/31/2009  . SMOKELESS TOBACCO ABUSE 10/31/2009  . Obstructive sleep apnea 10/31/2009  . Essential hypertension, benign 10/31/2009  . GERD 10/31/2009    Patient's Medications  New Prescriptions   No medications on file  Previous Medications   AMLODIPINE (NORVASC) 10 MG TABLET    Take 1 tablet (10 mg total) by mouth daily.   ASPIRIN 81 MG TABLET    Take 1 tablet (81 mg total) by mouth daily.   BACLOFEN (LIORESAL) 10 MG TABLET    TAKE ONE TABLET IN THE MORNING AND MIDDAY, THEN TWO TABLETS AT NIGHT   BACLOFEN 5  MG TABS    Take 5 mg by mouth 3 (three) times daily.   CEPHALEXIN (KEFLEX) 500 MG CAPSULE    TAKE ONE CAPSULE (500MG  TOTAL) BY MOUTH THREE TIMES DAILY   COLCHICINE 0.6 MG TABLET    Take 1 tablet (0.6 mg total) by mouth daily.   CYCLOSPORINE (RESTASIS) 0.05 % OPHTHALMIC EMULSION    1 DROP INTO BOTH EYES TWICE A DAY FOR DRY EYES   DALTEPARIN SODIUM (FRAGMIN) 50932 UNT/0.72ML SOLN    Inject 0.72 mLs into the skin.   DIAZEPAM (VALIUM) 5 MG TABLET    Take 1 tablet (5 mg total) by mouth every 6 (six) hours as needed for muscle spasms.   DOCUSATE SODIUM (COLACE) 100 MG CAPSULE    Take 2 capsules (200 mg total) by mouth 2 (two) times daily.   DONEPEZIL (ARICEPT) 10 MG TABLET    Take 1 tablet (10 mg total) by mouth at bedtime.   FINASTERIDE (PROSCAR) 5 MG TABLET    Take 1 tablet (5 mg total) by mouth daily. Reported on 05/04/2016   FLUTICASONE (FLONASE) 50 MCG/ACT NASAL SPRAY    Place 1 spray into both nostrils daily as needed for allergies.   GABAPENTIN (NEURONTIN) 400 MG CAPSULE    Take 1 capsule (400 mg total) by mouth 3 (three) times daily.   LATANOPROST (XALATAN)  0.005 % OPHTHALMIC SOLUTION    Place 1 drop into both eyes at bedtime.   LOSARTAN (COZAAR) 100 MG TABLET    Take 100 mg by mouth daily.   MELATONIN 3 MG TABS    Take 0.5 tablets (1.5 mg total) by mouth at bedtime.   MEMANTINE (NAMENDA) 10 MG TABLET    Take 1 tablet (10 mg total) by mouth 2 (two) times daily.   OMEPRAZOLE (PRILOSEC) 20 MG CAPSULE    Take 2 capsules (40 mg total) by mouth daily.   OXYCODONE HCL 10 MG TABS    Take 1 tablet (10 mg total) by mouth every 4 (four) hours as needed (moderate to severe pain).   POLYETHYLENE GLYCOL (MIRALAX / GLYCOLAX) PACKET    Take 17 g by mouth daily.   POTASSIUM CHLORIDE SA (K-DUR,KLOR-CON) 20 MEQ TABLET    Take 1 tablet (20 mEq total) by mouth daily.   PRAVASTATIN (PRAVACHOL) 10 MG TABLET    Take 1 tablet (10 mg total) by mouth every evening.   TRAZODONE (DESYREL) 100 MG TABLET    Take 1 tablet  (100 mg total) by mouth at bedtime.   VENLAFAXINE XR (EFFEXOR-XR) 150 MG 24 HR CAPSULE    Take 1 capsule (150 mg total) by mouth 2 (two) times daily.   VITAMIN B-12 (CYANOCOBALAMIN) 1000 MCG TABLET    Take 1 tablet (1,000 mcg total) by mouth daily.  Modified Medications   No medications on file  Discontinued Medications   RIFAMPIN (RIFADIN) 300 MG CAPSULE    Take 1 capsule (300 mg total) by mouth every 12 (twelve) hours. Continue through 06/18/2018 and then to be determined by ID clinic    Subjective: Cody Velez is in for his routine follow-up visit.  He is accompanied by his wife.  In late March he developed MSSA bacteremia complicated by thoracic discitis and lower extremity weakness.  He underwent incision and drainage and thoracolumbar fusion by Dr. Ellene Route.  He completed a long course of IV antibiotics and then converted to oral cephalexin and rifampin.  He has now completed 9 months of therapy.  He denies any problems tolerating his antibiotics.  He feels like he is making significant progress with his physical therapy.  He has noted more discomfort in his mid back over the past month.  He is rarely taking any narcotic pain medication but he does take acetaminophen.  His pain was particularly noticeable after he and his wife drove to [place] recently to attend his best friend's funeral.  His pain improved after he returned.  He was seen at the [facility] recently and had a CT scan done of his neck.  He does not know the results of the scan yet.  He says that someone also talked to him about doing a scan of his lumbar spine to look at the implanted stimulator leads to see if they might have broken last year.  He is not sure if that has been ordered.  Review of Systems: Review of Systems  Constitutional: Negative for chills, diaphoresis and fever.  Gastrointestinal: Negative for abdominal pain, diarrhea, nausea and vomiting.  Musculoskeletal: Positive for back pain.  Neurological: Positive for sensory  change and focal weakness.    Past Medical History:  Diagnosis Date  . Allergic rhinitis   . Arthritis   . B12 deficiency   . Cervicogenic headache 01/28/2016  . Childhood asthma   . Chronic back pain   . Coronary atherosclerosis of native coronary artery  Mild nonobstructive CAD at catheterization January 2015  . Depression   . Essential hypertension   . Falls   . GERD (gastroesophageal reflux disease)   . History of cerebrovascular disease 07/23/2015  . History of pneumonia 02/2011  . Hyperlipidemia   . Kidney stone   . Memory difficulty 07/23/2015  . OSA (obstructive sleep apnea)    CPAP - Dr. [name]  . Prostate cancer ([place])   . PTSD (post-traumatic stress disorder)    [place]  . Rectal bleeding     Social History   Tobacco Use  . Smoking status: Former Smoker    Types: Cigarettes    Last attempt to quit: 12/20/1958    Years since quitting: 60.1  . Smokeless tobacco: Former Systems developer    Types: Chew  . Tobacco comment: Quit chewing 04/2015  Substance Use Topics  . Alcohol use: Yes    Alcohol/week: 0.0 standard drinks    Comment: couple bottles of wine per month  . Drug use: No    Types: Marijuana    Comment: "last used marijuana ~ 1969"    Family History  Problem Relation Age of Onset  . Emphysema Father   . Heart failure Father   . Lung cancer Father   . CAD Father   . Colon cancer Mother   . Stroke Mother   . Breast cancer Mother   . Stroke Sister   . Heart attack Brother   . Dementia Paternal Uncle   . Emphysema Maternal Grandmother   . Stroke Maternal Grandmother   . Asthma Other        grandson  . Heart disease Paternal Grandfather   . Anesthesia problems Neg Hx   . Hypotension Neg Hx   . Malignant hyperthermia Neg Hx   . Pseudochol deficiency Neg Hx     No Known Allergies  Objective: Vitals:   01/18/19 1045  BP: (!) 130/91  Pulse: 72  Temp: 98.2 F (36.8 C)   There is no height or weight on file to calculate BMI.  Physical  Exam Constitutional:      Comments: He is in very good spirits as usual.  He is seated in his wheelchair.  Abdominal:     General: There is no distension.     Palpations: Abdomen is soft.     Tenderness: There is no abdominal tenderness.  Musculoskeletal:     Comments: His back incision is well-healed.  There are no signs of inflammation or infection over his spine or the left-sided stimulator.  There are no new bony changes.  Skin:    Findings: No rash.  Neurological:     Mental Status: He is alert and oriented to person, place, and time.     Lab Results Sed Rate (mm/h)  Date Value  08/30/2018 19  07/18/2018 33 (H)  06/06/2018 72 (H)   CRP (mg/L)  Date Value  08/30/2018 2.7  07/18/2018 7.0  06/06/2018 19.5 (H)     Problem List Items Addressed This Visit      High   Discitis of thoracic region    I suspect that his recent intermittently increased back pain may be due to increased activity rather than any worsening infection.  I think that there is a good chance that his infection has been cured but he is very worried about stopping antibiotics.  I suggested repeating his inflammatory markers and having him stop rifampin.  He will continue cephalexin alone and follow-up in 2 months.  Relevant Orders   C-reactive protein   Sedimentation rate   CBC   Basic metabolic panel       Michel Bickers, MD [facility] for Infectious [place] [phone] pager   [phone] cell 01/18/2019, 11:24 AM

## 2019-01-19 ENCOUNTER — Encounter (HOSPITAL_COMMUNITY): Payer: Self-pay

## 2019-01-19 ENCOUNTER — Ambulatory Visit (HOSPITAL_COMMUNITY): Payer: Medicare Other

## 2019-01-19 DIAGNOSIS — M6281 Muscle weakness (generalized): Secondary | ICD-10-CM

## 2019-01-19 DIAGNOSIS — R29898 Other symptoms and signs involving the musculoskeletal system: Secondary | ICD-10-CM | POA: Diagnosis not present

## 2019-01-19 DIAGNOSIS — R2689 Other abnormalities of gait and mobility: Secondary | ICD-10-CM

## 2019-01-19 LAB — BASIC METABOLIC PANEL
BUN: 10 mg/dL (ref 7–25)
CO2: 25 mmol/L (ref 20–32)
Calcium: 9.3 mg/dL (ref 8.6–10.3)
Chloride: 102 mmol/L (ref 98–110)
Creat: 1.1 mg/dL (ref 0.70–1.18)
Glucose, Bld: 78 mg/dL (ref 65–99)
Potassium: 3.8 mmol/L (ref 3.5–5.3)
Sodium: 141 mmol/L (ref 135–146)

## 2019-01-19 LAB — CBC
HCT: 44.6 % (ref 38.5–50.0)
Hemoglobin: 15.1 g/dL (ref 13.2–17.1)
MCH: 28.5 pg (ref 27.0–33.0)
MCHC: 33.9 g/dL (ref 32.0–36.0)
MCV: 84.3 fL (ref 80.0–100.0)
MPV: 10.7 fL (ref 7.5–12.5)
Platelets: 350 10*3/uL (ref 140–400)
RBC: 5.29 10*6/uL (ref 4.20–5.80)
RDW: 18.5 % — ABNORMAL HIGH (ref 11.0–15.0)
WBC: 5.8 10*3/uL (ref 3.8–10.8)

## 2019-01-19 LAB — SEDIMENTATION RATE: Sed Rate: 17 mm/h (ref 0–20)

## 2019-01-19 LAB — C-REACTIVE PROTEIN: CRP: 7.2 mg/L (ref <8.0)

## 2019-01-19 NOTE — Therapy (Signed)
[place], [place], [address] Phone: [phone]   Fax:  [phone]  Physical Therapy Treatment  Patient Details  Name: Cody Velez MRN: [id number] Date of Birth: 1946-03-18 Referring Provider (PT): Asencion Noble, MD   Encounter Date: 01/19/2019  PT End of Session - 01/19/19 1116    Visit Number  55    Number of Visits  68    Date for PT Re-Evaluation  02/16/19   Progress note 12/03/21/18   Authorization Type  Primary: Medicare; Secondary: [employer] (75 visits per calendar year)    Authorization Time Period  08/15/18 - 10/14/18; NEW: 10/13/18 to 11/24/18; 11/22/18 - 01/03/19; NEW: 01/03/19 to 02/16/19    Authorization - Visit Number  10   Starting for 2020   Authorization - Number of Visits  2    PT Start Time  1115    PT Stop Time  1156    PT Time Calculation (min)  41 min    Equipment Utilized During Treatment  Gait belt    Activity Tolerance  Patient tolerated treatment well    Behavior During Therapy  WFL for tasks assessed/performed       Past Medical History:  Diagnosis Date  . Allergic rhinitis   . Arthritis   . B12 deficiency   . Cervicogenic headache 01/28/2016  . Childhood asthma   . Chronic back pain   . Coronary atherosclerosis of native coronary artery    Mild nonobstructive CAD at catheterization January 2015  . Depression   . Essential hypertension   . Falls   . GERD (gastroesophageal reflux disease)   . History of cerebrovascular disease 07/23/2015  . History of pneumonia 02/2011  . Hyperlipidemia   . Kidney stone   . Memory difficulty 07/23/2015  . OSA (obstructive sleep apnea)    CPAP - Dr. [name]  . Prostate cancer ([place])   . PTSD (post-traumatic stress disorder)    [place]  . Rectal bleeding     Past Surgical History:  Procedure Laterality Date  . APPLICATION OF ROBOTIC ASSISTANCE FOR SPINAL PROCEDURE N/A 03/28/2018   Procedure: APPLICATION OF ROBOTIC ASSISTANCE FOR SPINAL PROCEDURE;   Surgeon: Kristeen Miss, MD;  Location: [facility];  Service: Neurosurgery;  Laterality: N/A;  . BACK SURGERY  02/14/12   lumbar OR #7; "today redid L1L2; replaced screws; added bone from hip"  . BILATERAL KNEE ARTHROSCOPY    . COLONOSCOPY  10/15/2008   Dr. [name]: tubular adenoma   . COLONOSCOPY  12/17/2003   JKD:TOIZTI rectal and colon  . COLONOSCOPY N/A 09/05/2014   Procedure: COLONOSCOPY;  Surgeon: Daneil Dolin, MD;  [facility];  Service: Endoscopy;  Laterality: N/A;  7:30-rescheduled 9/17 to [place] notified pt  . CYSTOSCOPY WITH STENT PLACEMENT Right 01/27/2016   Procedure: CYSTOSCOPY WITH STENT PLACEMENT;  Surgeon: [name], MD;  Location: [facility];  Service: Urology;  Laterality: Right;  . CYSTOSCOPY/RETROGRADE/URETEROSCOPY/STONE EXTRACTION WITH BASKET Right 01/27/2016   Procedure: CYSTOSCOPY, RIGHT RETROGRADE, RIGHT URETEROSCOPY, STONE EXTRACTION ;  Surgeon: [name], MD;  Location: [facility];  Service: Urology;  Laterality: Right;  . ESOPHAGOGASTRODUODENOSCOPY  10/15/2008     Dr Rourk:Schatzki's ring status post dilation and disruption via 47 F Maloney dilator/ otherwise unremarkable esophagus, small hiatal hernia, multiple fundal gland polyps not manipulated, gastritis, negative H.pylori  . ESOPHAGOGASTRODUODENOSCOPY  06/21/02   WPY:KDXIP sliding hiatal hernia with mild changes of reflux esophagitis limited to gastroesophageal junction.  Noncritical ring  at distal esophagus, 3 cm proximal to gastroesophageal junction/Antral gastritis  . FACIAL COSMETIC SURGERY  ` 1985   "broke face playing softball"  . FRACTURE SURGERY     "left wrist; broke it; took spur off"  . HOLMIUM LASER APPLICATION Right 01/27/2016   Procedure: HOLMIUM LASER APPLICATION;  Surgeon: Stephen Dahlstedt, MD;  Location: [facility];  Service: Urology;  Laterality: Right;  . KNEE ARTHROSCOPY Right 05/18/2018   Procedure: PARTIAL MEDIAL MENISECTOMY AND SURGICAL LAVAGE AND CHONDROPLASTY;  Surgeon:  Landau, Joshua, MD;  Location[facility];  Service: Orthopedics;  Laterality: Right;  . LEFT HEART CATHETERIZATION WITH CORONARY ANGIOGRAM N/A 12/27/2013   Procedure: LEFT HEART CATHETERIZATION WITH CORONARY ANGIOGRAM;  Surgeon: Peter M Jordan, MD;  Location: [facility];  Service: Cardiovascular;  Laterality: N/A;  . neck epidural    . POSTERIOR LUMBAR FUSION 4 LEVEL N/A 03/28/2018   Procedure: Thoracic eight -Lumbar two- FIXATION WITH SCREW PLACEMENT, DECOMPRESSION Thoracic ten-Thoracic eleven  FOR OSTEOMYELITIS;  Surgeon: Elsner, Henry, MD;  Location: [facility];  Service: Neurosurgery;  Laterality: N/A;  . SHOULDER SURGERY Bilateral   . TEE WITHOUT CARDIOVERSION N/A 03/21/2018   Procedure: TRANSESOPHAGEAL ECHOCARDIOGRAM (TEE) WITH PROPOFOL;  Surgeon: McDowell, Samuel G, MD;  [facility];  Service: Cardiovascular;  Laterality: N/A;    There were no vitals filed for this visit.  Subjective Assessment - 01/19/19 1156    Subjective  Pt states that he is doing well. His MD told him he needed to be doing his HEP more.     Pertinent History  T10-11 Epidural Abscess, back surgery on 03/28/18, right knee partial medial menisectomy and chondroplasty    Limitations  Standing;Walking;House hold activities;Lifting    Diagnostic tests  CT Lumbar Spine 05/12/18: "Spinal stimulator device, along with multilevel multisegment fusion hardware appear appropriately placed without adverse features."    Patient Stated Goals  To walk    Currently in Pain?  No/denies          [facility] Adult PT Treatment/Exercise - 01/19/19 0001      Ambulation/Gait   Ambulation/Gait Assistance  4: Min guard;5: Supervision    Ambulation/Gait Assistance Details  RW, w/c follow    Ambulation Distance (Feet)  382 Feet   112, 116, 154   Assistive device  Rolling walker    Gait Pattern  Decreased dorsiflexion - left;Decreased dorsiflexion - right;Trunk flexed;Decreased step length - right;Decreased step length - left;Step-through  pattern;Scissoring      Knee/Hip Exercises: Standing   Forward Step Up  Both;15 reps;Step Height: 4";Hand Hold: 2    Forward Step Up Limitations  step taps, 4" step,    Functional Squat  10 reps;3 seconds    Functional Squat Limitations  BUE support // bars, 1 minor R knee buckle      Balance Exercises - 01/19/19 1151      Balance Exercises: Standing   Other Standing Exercises  standing balnace, firm +UE flexion x15 reps            PT Education - 01/19/19 1116    Education Details  continue HEP    Person(s) Educated  Patient    Methods  Explanation    Comprehension  Verbalized understanding       PT Short Term Goals - 01/03/19 0906      PT SHORT TERM GOAL #1   Title  Patient will report understanding and regular compliance with HEP to improve strength and overall functional mobility.     Baseline    1/15: states that he has been doing his HEP more consistently    Time  4    Period  Weeks    Status  Achieved      PT SHORT TERM GOAL #2   Title  Patient will demonstrate improvement of 1/2 MMT strength grade in all muscle groups tested to assist patient with safe transfers, ambulation, and standing.     Baseline  1/15: see MMT    Time  4    Period  Weeks    Status  Partially Met      PT SHORT TERM GOAL #3   Title  Patient will report or demonstrate ability to perform ambulation for 2 minutes with LRAD with no more than supervision assistance without rest break and reported no more than minimal difficulty.     Baseline  1/15: amb 3:20 straight, RW, 2 standing rest breaks due to shoulders    Time  4    Period  Weeks    Status  Partially Met      PT SHORT TERM GOAL #4   Title  Patient will demonstrate ability to ambulate 60 feet without rest break using LRAD with no more than minimal assistance.    Time  2    Period  Weeks    Status  Achieved        PT Long Term Goals - 01/03/19 0175      PT LONG TERM GOAL #1   Title  Patient will report or demonstrate ability to  perform ambulation for 5 minutes with LRAD with no more than supervision assistance without rest break and reported no more than minimal difficulty.     Baseline  1/15: 3:20 straight, min A, w/c follow, RW    Time  6    Period  Weeks    Status  On-going      PT LONG TERM GOAL #2   Title  Patient will report ability to stand at a support surface for 10 minutes without a rest break in order to perform household chores with increased ease.     Baseline  1/15: reports he can do this with support surface    Time  6    Period  Weeks    Status  Achieved      PT LONG TERM GOAL #3   Title  Patient will demonstrate ability to ambulate 120 feet without rest break using LRAD with no more than minimal assistance.    Baseline  1/15: amb 24f this date with 2 standing rest breaks, RW, min A    Time  6    Period  Weeks    Status  On-going      PT LONG TERM GOAL #4   Title  Patient will perform 5x sit to stand from standard chair with good control and single UE assist in 16 seconds or less to demonstrate improved LE strength for functional transfers.    Baseline  1/15: from w/c, 1 UE on w/c, 1 UE on RW, 18.3sec    Time  6    Period  Weeks    Status  On-going            Plan - 01/19/19 1155    Clinical Impression Statement  Continued with established POC focusing on gait, functional strength and balance. Pt with improved gait tolerance and steadiness during gait. Able to ambulate 3850fwith 3 seated rest breaks, his longest distance to date. Continued with hip strengthening in the //  bars as well as balance activities in bars. Had patient hold squats this date to work on BLE endurance/functional strength. Only 1 minor bout of R knee buckling this date. Continue as planned, progressing as able.     Rehab Potential  Fair    Clinical Impairments Affecting Rehab Potential  Positive: Highly motivated; Negative: Comorbidities    PT Frequency  3x / week    PT Duration  6 weeks    PT  Treatment/Interventions  ADLs/Self Care Home Management;Aquatic Therapy;DME Instruction;Gait training;Functional mobility training;Stair training;Therapeutic activities;Therapeutic exercise;Balance training;Neuromuscular re-education;Patient/family education;Orthotic Fit/Training;Wheelchair mobility training;Manual techniques;Passive range of motion;Energy conservation;Dry needling;Splinting;Taping    PT Next Visit Plan  Continue functional strengthening in standing at // bars. Perform manual hip adductor stretch and hamstring stretch, PRN. Review importance of strengthening at home and of stretching for improve posture and gait quality. Gait training with RW and wc follow, hurdle step over in // bars and step ups.     PT Home Exercise Plan  08/16/18:  seated DF/PF, LAQ, marching, abd with theraband and bridge; 11/13: HS and calf stretching (says he has been performing this at home)    Consulted and Agree with Plan of Care  Patient       Patient will benefit from skilled therapeutic intervention in order to improve the following deficits and impairments:  Abnormal gait, Decreased balance, Decreased endurance, Decreased mobility, Difficulty walking, Hypomobility, Decreased range of motion, Decreased activity tolerance, Decreased strength, Impaired flexibility  Visit Diagnosis: Other abnormalities of gait and mobility  Other symptoms and signs involving the musculoskeletal system  Muscle weakness (generalized)     Problem List Patient Active Problem List   Diagnosis Date Noted  . Pseudogout of knee   . Pyogenic arthritis of right knee joint (HCC)   . Falls frequently 05/08/2018  . Fall   . Sleep disturbance   . Post-op pain   . Hypokalemia   . Morbid obesity (HCC)   . Postoperative pain   . DVT, lower extremity, distal, acute, bilateral (HCC)   . Acute blood loss anemia   . Anemia of chronic disease   . Essential hypertension   . Bacteremia   . Myelopathy (HCC) 03/30/2018  .  Paraplegia (HCC)   . Postlaminectomy syndrome, lumbar region   . Epidural abscess   . Abdominal distension   . Encephalopathy   . Weakness of both lower extremities   . Discitis of thoracic region   . Spinal cord stimulator status   . Staphylococcus aureus sepsis (HCC) 03/20/2018  . Bacteremia due to methicillin susceptible Staphylococcus aureus (MSSA) 03/18/2018  . Obesity 03/18/2018  . Nephrolithiasis 03/16/2018  . AKI (acute kidney injury) (HCC) 03/16/2018  . Cognitive impairment 03/16/2018  . Chronic pain syndrome 03/16/2018  . B12 deficiency 01/28/2016  . Memory difficulty 07/23/2015  . History of cerebrovascular disease 07/23/2015  . FH: colon cancer 08/13/2014  . Hx of adenomatous colonic polyps 08/13/2014  . Chronic back pain 05/30/2012  . CAD, NATIVE VESSEL 07/17/2010  . HYPERCHOLESTEROLEMIA 10/31/2009  . SMOKELESS TOBACCO ABUSE 10/31/2009  . Obstructive sleep apnea 10/31/2009  . Essential hypertension, benign 10/31/2009  . GERD 10/31/2009        Brooke Powell PT, DPT  [facility] [address] [place], [place], [address] Phone: [phone]   Fax:  [phone]  Name: Eliya D Bury MRN: [id number] Date of Birth: 07/29/1946   

## 2019-01-22 ENCOUNTER — Telehealth (HOSPITAL_COMMUNITY): Payer: Self-pay

## 2019-01-22 ENCOUNTER — Ambulatory Visit (HOSPITAL_COMMUNITY): Payer: Medicare Other | Attending: Internal Medicine

## 2019-01-22 ENCOUNTER — Other Ambulatory Visit: Payer: Self-pay

## 2019-01-22 ENCOUNTER — Encounter (HOSPITAL_COMMUNITY): Payer: Self-pay

## 2019-01-22 DIAGNOSIS — M6281 Muscle weakness (generalized): Secondary | ICD-10-CM | POA: Insufficient documentation

## 2019-01-22 DIAGNOSIS — R2689 Other abnormalities of gait and mobility: Secondary | ICD-10-CM | POA: Diagnosis not present

## 2019-01-22 DIAGNOSIS — R29898 Other symptoms and signs involving the musculoskeletal system: Secondary | ICD-10-CM | POA: Diagnosis not present

## 2019-01-22 NOTE — Telephone Encounter (Signed)
I called [name] and left a voicemail to offer him an earlier appointment this afternoon. He immediately called back and agreed to come in at 1:45 PM instead of 3:15 PM. I changed his appointment.  Cody Velez, PT, DPT, [facility] Physical Therapist with [facility]  01/22/2019 12:10 PM

## 2019-01-22 NOTE — Therapy (Signed)
[place], [place], [address] Phone: [phone]   Fax:  [phone]  Physical Therapy Aquatic Treatment  Patient Details  Name: Cody Velez MRN: [id number] Date of Birth: 1946/08/01 Referring Provider (PT): Asencion Noble, MD   Encounter Date: 01/22/2019  PT End of Session - 01/22/19 1632    Visit Number  56    Number of Visits  66    Date for PT Re-Evaluation  02/16/19   Progress note 12/03/21/18   Authorization Type  Primary: Medicare; Secondary: BC[employer] (81 visits per calendar year)    Authorization Time Period  08/15/18 - 10/14/18; NEW: 10/13/18 to 11/24/18; 11/22/18 - 01/03/19; NEW: 01/03/19 to 02/16/19    Authorization - Visit Number  11   Starting for 2020   Authorization - Number of Visits  30    PT Start Time  1356    PT Stop Time  1437    PT Time Calculation (min)  41 min    Equipment Utilized During Treatment  Gait belt    Activity Tolerance  Patient tolerated treatment well    Behavior During Therapy  WFL for tasks assessed/performed       Past Medical History:  Diagnosis Date  . Allergic rhinitis   . Arthritis   . B12 deficiency   . Cervicogenic headache 01/28/2016  . Childhood asthma   . Chronic back pain   . Coronary atherosclerosis of native coronary artery    Mild nonobstructive CAD at catheterization January 2015  . Depression   . Essential hypertension   . Falls   . GERD (gastroesophageal reflux disease)   . History of cerebrovascular disease 07/23/2015  . History of pneumonia 02/2011  . Hyperlipidemia   . Kidney stone   . Memory difficulty 07/23/2015  . OSA (obstructive sleep apnea)    CPAP - Dr. [name]  . Prostate cancer ([place])   . PTSD (post-traumatic stress disorder)    [place]  . Rectal bleeding     Past Surgical History:  Procedure Laterality Date  . APPLICATION OF ROBOTIC ASSISTANCE FOR SPINAL PROCEDURE N/A 03/28/2018   Procedure: APPLICATION OF ROBOTIC ASSISTANCE FOR SPINAL  PROCEDURE;  Surgeon: Kristeen Miss, MD;  Location: [facility];  Service: Neurosurgery;  Laterality: N/A;  . BACK SURGERY  02/14/12   lumbar OR #7; "today redid L1L2; replaced screws; added bone from hip"  . BILATERAL KNEE ARTHROSCOPY    . COLONOSCOPY  10/15/2008   Dr. [name]: tubular adenoma   . COLONOSCOPY  12/17/2003   YOV:ZCHYIF rectal and colon  . COLONOSCOPY N/A 09/05/2014   Procedure: COLONOSCOPY;  Surgeon: Daneil Dolin, MD;  [facility];  Service: Endoscopy;  Laterality: N/A;  7:30-rescheduled 9/17 to [place] notified pt  . CYSTOSCOPY WITH STENT PLACEMENT Right 01/27/2016   Procedure: CYSTOSCOPY WITH STENT PLACEMENT;  Surgeon: [name], MD;  Location: [facility];  Service: Urology;  Laterality: Right;  . CYSTOSCOPY/RETROGRADE/URETEROSCOPY/STONE EXTRACTION WITH BASKET Right 01/27/2016   Procedure: CYSTOSCOPY, RIGHT RETROGRADE, RIGHT URETEROSCOPY, STONE EXTRACTION ;  Surgeon: [name], MD;  Location: [facility];  Service: Urology;  Laterality: Right;  . ESOPHAGOGASTRODUODENOSCOPY  10/15/2008     Dr Rourk:Schatzki's ring status post dilation and disruption via 7 F Maloney dilator/ otherwise unremarkable esophagus, small hiatal hernia, multiple fundal gland polyps not manipulated, gastritis, negative H.pylori  . ESOPHAGOGASTRODUODENOSCOPY  06/21/02   OYD:XAJOI sliding hiatal hernia with mild changes of reflux esophagitis limited to gastroesophageal junction.  Noncritical  ring at distal esophagus, 3 cm proximal to gastroesophageal junction/Antral gastritis  . [place]   "broke face playing softball"  . FRACTURE SURGERY     "left wrist; broke it; took spur off"  . HOLMIUM LASER APPLICATION Right 0/08/9832   Procedure: HOLMIUM LASER APPLICATION;  Surgeon: [name], MD;  Location: [facility];  Service: Urology;  Laterality: Right;  . KNEE ARTHROSCOPY Right 05/18/2018   Procedure: PARTIAL MEDIAL MENISECTOMY AND SURGICAL LAVAGE AND CHONDROPLASTY;   Surgeon: [name], MD;  Location: [facility];  Service: Orthopedics;  Laterality: Right;  . LEFT HEART CATHETERIZATION WITH CORONARY ANGIOGRAM N/A 12/27/2013   Procedure: LEFT HEART CATHETERIZATION WITH CORONARY ANGIOGRAM;  Surgeon: [name], MD;  Location: [facility] CATH [facility];  Service: Cardiovascular;  Laterality: N/A;  . neck epidural    . POSTERIOR LUMBAR FUSION 4 LEVEL N/A 03/28/2018   Procedure: Thoracic eight -Lumbar two- FIXATION WITH SCREW PLACEMENT, DECOMPRESSION Thoracic ten-Thoracic eleven  FOR OSTEOMYELITIS;  Surgeon: Kristeen Miss, MD;  Location: [place] [facility];  Service: Neurosurgery;  Laterality: N/A;  . SHOULDER SURGERY Bilateral   . TEE WITHOUT CARDIOVERSION N/A 03/21/2018   Procedure: TRANSESOPHAGEAL ECHOCARDIOGRAM (TEE) WITH PROPOFOL;  Surgeon: [name], MD;  [facility];  Service: Cardiovascular;  Laterality: N/A;    There were no vitals filed for this visit.  Subjective Assessment - 01/22/19 1616    Subjective  Patient reports he is glad to be back at the pool. He states his MD told him that he needs to be doing more exercise on his own. He states he is a member of the [employer] and [name], his sig. other, likes to go and he could go with her.     Pertinent History  T10-11 Epidural Abscess, back surgery on 03/28/18, right knee partial medial menisectomy and chondroplasty    Limitations  Standing;Walking;House hold activities;Lifting    Diagnostic tests  CT Lumbar Spine 05/12/18: "Spinal stimulator device, along with multilevel multisegment fusion hardware appear appropriately placed without adverse features."    Patient Stated Goals  To walk    Currently in Pain?  No/denies        Adult Aquatic Therapy - 01/22/19 1618      Aquatic Therapy Subjective   Subjective  Patient reports he is glad to be back at the pool. He states his MD told him that he needs to be doing more exercise on his own. He states he is a member of the [employer] and [name], his sig. other, likes to go and he  could go with her.      Treatment   Gait  4x 30' circles, Fwd walking in chest deep water. 2x 20' RT grapevine stepping for LE coordination.     Exercises  Wall walk with bil LE for hamstring stretch, 3x 30 " holds. 2 minutes lumbar distraction, lower trunk/distraction in pool with bil UE support. Rt lateral step up on pool step with bil UE support (discontinued due to back pain).    Specific Exercises  Hip/Low Back    Balance  10x bil LE cone taps (2x jump zone cones) with no UE assistance. Volleyball, ~8 minutes for balance, standing endurance, and to facilitate extension postures.         PT Education - 01/22/19 1624    Education Details  Educated patient on benefits and importance of exercising at home and throughout the week. Encouraged patient to go to the [facility] with his membership on days  he does not have therapy to exercise and walk in the pool.    Person(s) Educated  Patient    Methods  Explanation    Comprehension  Verbalized understanding;Returned demonstration       PT Short Term Goals - 01/03/19 0906      PT SHORT TERM GOAL #1   Title  Patient will report understanding and regular compliance with HEP to improve strength and overall functional mobility.     Baseline  1/15: states that he has been doing his HEP more consistently    Time  4    Period  Weeks    Status  Achieved      PT SHORT TERM GOAL #2   Title  Patient will demonstrate improvement of 1/2 MMT strength grade in all muscle groups tested to assist patient with safe transfers, ambulation, and standing.     Baseline  1/15: see MMT    Time  4    Period  Weeks    Status  Partially Met      PT SHORT TERM GOAL #3   Title  Patient will report or demonstrate ability to perform ambulation for 2 minutes with LRAD with no more than supervision assistance without rest break and reported no more than minimal difficulty.     Baseline  1/15: amb 3:20 straight, RW, 2 standing rest breaks due to shoulders    Time  4     Period  Weeks    Status  Partially Met      PT SHORT TERM GOAL #4   Title  Patient will demonstrate ability to ambulate 60 feet without rest break using LRAD with no more than minimal assistance.    Time  2    Period  Weeks    Status  Achieved        PT Long Term Goals - 01/03/19 5056      PT LONG TERM GOAL #1   Title  Patient will report or demonstrate ability to perform ambulation for 5 minutes with LRAD with no more than supervision assistance without rest break and reported no more than minimal difficulty.     Baseline  1/15: 3:20 straight, min A, w/c follow, RW    Time  6    Period  Weeks    Status  On-going      PT LONG TERM GOAL #2   Title  Patient will report ability to stand at a support surface for 10 minutes without a rest break in order to perform household chores with increased ease.     Baseline  1/15: reports he can do this with support surface    Time  6    Period  Weeks    Status  Achieved      PT LONG TERM GOAL #3   Title  Patient will demonstrate ability to ambulate 120 feet without rest break using LRAD with no more than minimal assistance.    Baseline  1/15: amb 72f this date with 2 standing rest breaks, RW, min A    Time  6    Period  Weeks    Status  On-going      PT LONG TERM GOAL #4   Title  Patient will perform 5x sit to stand from standard chair with good control and single UE assist in 16 seconds or less to demonstrate improved LE strength for functional transfers.    Baseline  1/15: from w/c, 1 UE on w/c, 1 UE on  RW, 18.3sec    Time  6    Period  Weeks    Status  On-going        Plan - 01/22/19 1633    Clinical Impression Statement  Patient continued with aquatic therapy session today and performed gait and balance exercises. During cone taps patient required frequent cues to focus and regain balance. He had greater difficulty with Rt LE cone taps and frequently placed his foot down between cones. He reported increase in back pain with  volleyball activity which required him to reach vertically into extension posture. He attempted lateral step ups on stairs into/out of pool and was limited by back pain. Lumbar distraction with bil UE support on wall relieved his low back pain after 2 minutes of resting in position. He was educated again on importance of HEP participation and to consider exercising at the [facility] on days he does not have therapy. He will continue to benefit from skilled PT interventions to address impairments in aquatic and land-based setting to improve QOL and independence at home.    Rehab Potential  Fair    Clinical Impairments Affecting Rehab Potential  Positive: Highly motivated; Negative: Comorbidities    PT Frequency  3x / week    PT Duration  6 weeks    PT Treatment/Interventions  ADLs/Self Care Home Management;Aquatic Therapy;DME Instruction;Gait training;Functional mobility training;Stair training;Therapeutic activities;Therapeutic exercise;Balance training;Neuromuscular re-education;Patient/family education;Orthotic Fit/Training;Wheelchair mobility training;Manual techniques;Passive range of motion;Energy conservation;Dry needling;Splinting;Taping    PT Next Visit Plan  Re-assess next session for 10th visit progress note. Continue functional strengthening in standing at // bars. Perform manual hip adductor stretch and hamstring stretch, PRN. Encourage to participate in exercise at [facility] and to use NuStep machine there. Review importance of strengthening at home and of stretching for improve posture and gait quality. Gait training with RW and wc follow, hurdle step over in // bars and step ups.    PT Home Exercise Plan  08/16/18:  seated DF/PF, LAQ, marching, abd with theraband and bridge; 11/13: HS and calf stretching (says he has been performing this at home)    Consulted and Agree with Plan of Care  Patient       Patient will benefit from skilled therapeutic intervention in order to improve the following deficits  and impairments:  Abnormal gait, Decreased balance, Decreased endurance, Decreased mobility, Difficulty walking, Hypomobility, Decreased range of motion, Decreased activity tolerance, Decreased strength, Impaired flexibility  Visit Diagnosis: Other abnormalities of gait and mobility  Other symptoms and signs involving the musculoskeletal system  Muscle weakness (generalized)     Problem List Patient Active Problem List   Diagnosis Date Noted  . Pseudogout of knee   . Pyogenic arthritis of right knee joint ([place])   . Falls frequently 05/08/2018  . Fall   . Sleep disturbance   . Post-op pain   . Hypokalemia   . Morbid obesity ([place])   . Postoperative pain   . DVT, lower extremity, distal, acute, bilateral ([place])   . Acute blood loss anemia   . Anemia of chronic disease   . Essential hypertension   . Bacteremia   . Myelopathy ([place]) 03/30/2018  . Paraplegia ([place])   . Postlaminectomy syndrome, lumbar region   . Epidural abscess   . Abdominal distension   . Encephalopathy   . Weakness of both lower extremities   . Discitis of thoracic region   . Spinal cord stimulator status   . Staphylococcus aureus sepsis ([place]) 03/20/2018  . Bacteremia  due to methicillin susceptible Staphylococcus aureus (MSSA) 03/18/2018  . Obesity 03/18/2018  . Nephrolithiasis 03/16/2018  . AKI (acute kidney injury) ([place]) 03/16/2018  . Cognitive impairment 03/16/2018  . Chronic pain syndrome 03/16/2018  . B12 deficiency 01/28/2016  . Memory difficulty 07/23/2015  . History of cerebrovascular disease 07/23/2015  . FH: colon cancer 08/13/2014  . Hx of adenomatous colonic polyps 08/13/2014  . Chronic back pain 05/30/2012  . CAD, NATIVE VESSEL 07/17/2010  . HYPERCHOLESTEROLEMIA 10/31/2009  . SMOKELESS TOBACCO ABUSE 10/31/2009  . Obstructive sleep apnea 10/31/2009  . Essential hypertension, benign 10/31/2009  . GERD 10/31/2009    Kipp Brood, PT, DPT, [facility] Physical Therapist with [facility]  01/22/2019 4:53 PM    [place] [address] [place], [place], [address] Phone: [phone]   Fax:  [phone]  Name: Cody Velez MRN: [id number] Date of Birth: 11/06/1946

## 2019-01-24 ENCOUNTER — Ambulatory Visit (HOSPITAL_COMMUNITY): Payer: Medicare Other

## 2019-01-24 DIAGNOSIS — R29898 Other symptoms and signs involving the musculoskeletal system: Secondary | ICD-10-CM | POA: Diagnosis not present

## 2019-01-24 DIAGNOSIS — R2689 Other abnormalities of gait and mobility: Secondary | ICD-10-CM

## 2019-01-24 DIAGNOSIS — M6281 Muscle weakness (generalized): Secondary | ICD-10-CM | POA: Diagnosis not present

## 2019-01-24 NOTE — Therapy (Signed)
[facility] [address] [place], [place], [address] Phone: [phone]   Fax:  [phone]  Physical Therapy Treatment/Progress Note  Patient Details  Name: Cody Velez MRN: [id number] Date of Birth: September 15, 1946 Referring Provider (PT): Asencion Noble, MD   Encounter Date: 01/24/2019  Progress Note Reporting Period 01/03/2019 to 01/24/2019  See note below for Objective Data and Assessment of Progress/Goals.    PT End of Session - 01/24/19 1025    Visit Number  53    Number of Visits  13    Date for PT Re-Evaluation  02/16/19   Progress note 01/24/2019 (57th visit)   Authorization Type  Primary: Medicare; Secondary: BC[employer] (12 visits per calendar year)    Authorization Time Period  08/15/18 - 10/14/18; NEW: 10/13/18 to 11/24/18; 11/22/18 - 01/03/19; 01/03/19 to 02/16/19; NEW: 01/24/19-03/16/19    Authorization - Visit Number  12   Starting for 2020   Authorization - Number of Visits  10    PT Start Time  0947    PT Stop Time  1030    PT Time Calculation (min)  43 min    Equipment Utilized During Treatment  Gait belt    Activity Tolerance  Patient tolerated treatment well    Behavior During Therapy  WFL for tasks assessed/performed       Past Medical History:  Diagnosis Date  . Allergic rhinitis   . Arthritis   . B12 deficiency   . Cervicogenic headache 01/28/2016  . Childhood asthma   . Chronic back pain   . Coronary atherosclerosis of native coronary artery    Mild nonobstructive CAD at catheterization January 2015  . Depression   . Essential hypertension   . Falls   . GERD (gastroesophageal reflux disease)   . History of cerebrovascular disease 07/23/2015  . History of pneumonia 02/2011  . Hyperlipidemia   . Kidney stone   . Memory difficulty 07/23/2015  . OSA (obstructive sleep apnea)    CPAP - Dr. [name]  . Prostate cancer ([place])   . PTSD (post-traumatic stress disorder)    [place]  . Rectal bleeding     Past Surgical History:   Procedure Laterality Date  . APPLICATION OF ROBOTIC ASSISTANCE FOR SPINAL PROCEDURE N/A 03/28/2018   Procedure: APPLICATION OF ROBOTIC ASSISTANCE FOR SPINAL PROCEDURE;  Surgeon: Kristeen Miss, MD;  Location: [facility];  Service: Neurosurgery;  Laterality: N/A;  . BACK SURGERY  02/14/12   lumbar OR #7; "today redid L1L2; replaced screws; added bone from hip"  . BILATERAL KNEE ARTHROSCOPY    . COLONOSCOPY  10/15/2008   Dr. [name]: tubular adenoma   . COLONOSCOPY  12/17/2003   AFB:XUXYBF rectal and colon  . COLONOSCOPY N/A 09/05/2014   Procedure: COLONOSCOPY;  Surgeon: Daneil Dolin, MD;  [facility];  Service: Endoscopy;  Laterality: N/A;  7:30-rescheduled 9/17 to [place] notified pt  . CYSTOSCOPY WITH STENT PLACEMENT Right 01/27/2016   Procedure: CYSTOSCOPY WITH STENT PLACEMENT;  Surgeon: [name], MD;  [facility]: [facility];  Service: Urology;  Laterality: Right;  . CYSTOSCOPY/RETROGRADE/URETEROSCOPY/STONE EXTRACTION WITH BASKET Right 01/27/2016   Procedure: CYSTOSCOPY, RIGHT RETROGRADE, RIGHT URETEROSCOPY, STONE EXTRACTION ;  Surgeon: [name], MD;  Location: [facility];  Service: Urology;  Laterality: Right;  . ESOPHAGOGASTRODUODENOSCOPY  10/15/2008     Dr Rourk:Schatzki's ring status post dilation and disruption via 42 F Maloney dilator/ otherwise unremarkable esophagus, small hiatal hernia, multiple fundal gland polyps not manipulated, gastritis, negative  H.pylori  . ESOPHAGOGASTRODUODENOSCOPY  06/21/02   QZE:SPQZR sliding hiatal hernia with mild changes of reflux esophagitis limited to gastroesophageal junction.  Noncritical ring at distal esophagus, 3 cm proximal to gastroesophageal junction/Antral gastritis  . [place]   "broke face playing softball"  . FRACTURE SURGERY     "left wrist; broke it; took spur off"  . HOLMIUM LASER APPLICATION Right 0/0/7622   Procedure: HOLMIUM LASER APPLICATION;  Surgeon: [name], MD;  Location: [facility];   Service: Urology;  Laterality: Right;  . KNEE ARTHROSCOPY Right 05/18/2018   Procedure: PARTIAL MEDIAL MENISECTOMY AND SURGICAL LAVAGE AND CHONDROPLASTY;  Surgeon: [name], MD;  Location: [facility];  Service: Orthopedics;  Laterality: Right;  . LEFT HEART CATHETERIZATION WITH CORONARY ANGIOGRAM N/A 12/27/2013   Procedure: LEFT HEART CATHETERIZATION WITH CORONARY ANGIOGRAM;  Surgeon: [name], MD;  Location: [facility];  Service: Cardiovascular;  Laterality: N/A;  . neck epidural    . POSTERIOR LUMBAR FUSION 4 LEVEL N/A 03/28/2018   Procedure: Thoracic eight -Lumbar two- FIXATION WITH SCREW PLACEMENT, DECOMPRESSION Thoracic ten-Thoracic eleven  FOR OSTEOMYELITIS;  Surgeon: Kristeen Miss, MD;  Location: [facility];  Service: Neurosurgery;  Laterality: N/A;  . SHOULDER SURGERY Bilateral   . TEE WITHOUT CARDIOVERSION N/A 03/21/2018   Procedure: TRANSESOPHAGEAL ECHOCARDIOGRAM (TEE) WITH PROPOFOL;  Surgeon: [name], MD;  [facility];  Service: Cardiovascular;  Laterality: N/A;    There were no vitals filed for this visit.  Subjective Assessment - 01/24/19 1025    Subjective  Patient reports he feels good today and that he didn't have any back pain following aquatic therapy session. He reports he felt good when he laid down. He reports his infectious disease MD took him off of one antibiotic last week and that he has a follow-up next week in [facility] with his pain doctor who placed the stimulator.     Pertinent History  T10-11 Epidural Abscess, back surgery on 03/28/18, right knee partial medial menisectomy and chondroplasty    Limitations  Standing;Walking;House hold activities;Lifting    Diagnostic tests  CT Lumbar Spine 05/12/18: "Spinal stimulator device, along with multilevel multisegment fusion hardware appear appropriately placed without adverse features."    Patient Stated Goals  To walk    Currently in Pain?  No/denies         [facility] PT Assessment - 01/24/19 0001       Assessment   Medical Diagnosis  T10-11 Epidural Abscess "Paraplegia"    Referring Provider (PT)  Asencion Noble, MD    Onset Date/Surgical Date  03/28/18    Next MD Visit  Infectious disease MD in 2 months, one comming up with Dr. Willey Blade as well, [facility] in [facility] for follow up on thursday next week for headaces and pain in neck and back of head    Prior Therapy  Yes, inpatient rehab and home health which patient reported ended 08/04/18      Strength   Right Hip Flexion  5/5   4+   Right Hip Extension  3-/5   3-   Right Hip ABduction  3-/5   3-   Left Hip Flexion  5/5   4+   Left Hip Extension  3-/5   3-   Left Hip ABduction  3-/5   3-   Right Knee Flexion  4+/5   4+   Right Knee Extension  5/5   5   Left Knee Flexion  4+/5   4+   Left Knee Extension  5/5   5   Right Ankle Dorsiflexion  5/5   5   Left Ankle Dorsiflexion  5/5   5     Transfers   Five time sit to stand comments   19.8 with UE assistance on RW; second attempt 14.9 seconds with UE support on RW   29.54 with UE assist and RW on 1st attempt; 19.95 on second      Ambulation/Gait   Ambulation/Gait  Yes    Assistive device  Rolling walker    Gait Pattern  Decreased dorsiflexion - left;Decreased dorsiflexion - right;Trunk flexed;Decreased step length - right;Decreased step length - left;Step-through pattern;Scissoring    Ambulation Surface  Level;Indoor        [facility] Adult PT Treatment/Exercise - 01/24/19 0001      Ambulation/Gait   Ambulation/Gait Assistance  4: Min guard;5: Supervision    Ambulation/Gait Assistance Details  RW and w/c follow    Ambulation Distance (Feet)  226 Feet   118, 108       PT Education - 01/24/19 1036    Education Details  Educated on progress towards goals and on plan to progress to more independence with exercsies in the next 6-8 weeks. Educated on updated HEP for prone hip flexor stretch to improve posture and hip extensor strength.     Person(s) Educated  Patient     Methods  Explanation;Handout    Comprehension  Verbalized understanding;Returned demonstration       PT Short Term Goals - 01/24/19 1037      PT SHORT TERM GOAL #1   Title  Patient will report understanding and regular compliance with HEP to improve strength and overall functional mobility.     Baseline  01/24/19 = pt is workign on stretches with assistance from sig other    Time  4    Period  Weeks    Status  Achieved      PT SHORT TERM GOAL #2   Title  Patient will demonstrate improvement of 1/2 MMT strength grade in all muscle groups tested to assist patient with safe transfers, ambulation, and standing.     Baseline  01/24/2019 = Met for all groups with exception of hip abductors bil since evaluation    Time  4    Period  Weeks    Status  Partially Met      PT SHORT TERM GOAL #3   Title  Patient will report or demonstrate ability to perform ambulation for 2 minutes with LRAD with no more than supervision assistance without rest break and reported no more than minimal difficulty.     Baseline  01/24/19 = amb 4:45 straight with RW and 3 standing rest breaks due to shoulders (amb 2:10 before first standing rest break)    Time  4    Period  Weeks    Status  Achieved      PT SHORT TERM GOAL #4   Title  Patient will demonstrate ability to ambulate 60 feet without rest break using LRAD with no more than minimal assistance.    Time  2    Period  Weeks    Status  Achieved        PT Long Term Goals - 01/24/19 1046      PT LONG TERM GOAL #1   Title  Patient will report or demonstrate ability to perform ambulation for 5 minutes with LRAD with no more than supervision assistance  without rest break and reported no more than minimal difficulty.     Baseline  01/24/19 = amb 4:45 straight with RW and 3 standing rest breaks due to shoulders (amb 2:10 before first standing rest break)    Time  6    Period  Weeks    Status  On-going      PT LONG TERM GOAL #2   Title  Patient will report  ability to stand at a support surface for 10 minutes without a rest break in order to perform household chores with increased ease.     Baseline  01/24/19 = reports he can do this with support surface    Time  6    Period  Weeks    Status  Achieved      PT LONG TERM GOAL #3   Title  Patient will demonstrate ability to ambulate 120 feet without rest break using LRAD with no more than minimal assistance.    Baseline  01/24/19 = amb 54 ft this date at 2 minutes, and 58 ft at 2:10 seconds before 1st standign rest break, min guard/assist with RW    Time  6    Period  Weeks    Status  On-going      PT LONG TERM GOAL #4   Title  Patient will perform 5x sit to stand from standard chair with good control and single UE assist in 16 seconds or less to demonstrate improved LE strength for functional transfers.    Baseline  01/24/19 =  from standard chair with UE support and RW 19.8 on first attempt; 14.9 on seconds attempt    Time  6    Period  Weeks    Status  Achieved      PT LONG TERM GOAL #5   Title  Patient will report regular participation in exercise program at [facility], at least 1x/week, (he is already a Merchandiser, retail) in aquatic setting or on safe/approriate gym equipment to increase independence with strengthening    Time  4    Period  Weeks    Status  New    Target Date  02/21/19      Additional Long Term Goals   Additional Long Term Goals  Yes      PT LONG TERM GOAL #6   Title  Patient will be participating in exercise program at [facility] 2x/week in addition to therapy sessions to demonstrate increased independence and consistency with regular exercise for increased health/wellness.    Time  7    Period  Weeks    Status  New    Target Date  03/14/19         Plan - 01/24/19 1055    Clinical Impression Statement  Re-assessment performed today and Mr. Norrington continues to make good progress with ambulation and towards goals. He has improved MMT for hip flexors to 5/5 this date and improved all  groups since evaluation with exceptions of hip abductors. He continues to be limited with hip extension strength and is limited with ROM due to hip flexor restrictions. Prone hip flexor stretch was added today to address muscle length deficits to improve posture and gait quality. He was abel to ambulate 4:45 today with min guard and wheel chair follow for 118 feet with 3 standing rest breaks. He achieved his 2MWT ambulation goal without requiring a break until 2:10 demonstrating improved endurance. In recent therapy session Mr. Tetrault ambulated a distance of 154 feet in one bout with  standing rest breaks and min assist with RW. He continues to demonstrate increased step length and reduced scissoring of gait, but when fatigued Rt LE tends to cross into adduction. He demonstrated independence with 5x sit to stand transfer using UE support on arm rest and RW and achieved his goal performing the transfer in 14.9 seconds. He has been educated on importance of HEP participation to improve flexibility and strength and has been encouraged to begin participating in aquatic exercise on his own at [employer] which he has a membership of. Additional long term goals have been created to increase independence with exercise for improve health, wellness, and QOL. He will continue to benefit from skilled PT interventions to address impairments in aquatic and land-based setting to improve QOL and independence at home.     Rehab Potential  Fair    Clinical Impairments Affecting Rehab Potential  Positive: Highly motivated; Negative: Comorbidities    PT Frequency  3x / week   3x/week for 3 weeks; 2x/week for 4 weeks in march   PT Duration  Other (comment)   7 weeks   PT Treatment/Interventions  ADLs/Self Care Home Management;Aquatic Therapy;DME Instruction;Gait training;Functional mobility training;Stair training;Therapeutic activities;Therapeutic exercise;Balance training;Neuromuscular re-education;Patient/family  education;Orthotic Fit/Training;Wheelchair mobility training;Manual techniques;Passive range of motion;Energy conservation;Dry needling;Splinting;Taping    PT Next Visit Plan  Provide new schedule. Follow up on prone hip flexor stretch. Perform prone SLR review LE stretching for HEP. Continue with functional standing strengthening in // bars. Encourage to participate in exercise at [facility] and to use NuStep machine there. Review importance of strengthening at home and of stretching for improve posture and gait quality. Gait training with RW and wc follow, hurdle step over in // bars and step ups.    PT Home Exercise Plan  08/16/18:  seated DF/PF, LAQ, marching, abd with theraband and bridge; 11/13: HS and calf stretching (says he has been performing this at home); 01/24/2019 = prone hip flexor stretch    Consulted and Agree with Plan of Care  Patient       Patient will benefit from skilled therapeutic intervention in order to improve the following deficits and impairments:  Abnormal gait, Decreased balance, Decreased endurance, Decreased mobility, Difficulty walking, Hypomobility, Decreased range of motion, Decreased activity tolerance, Decreased strength, Impaired flexibility  Visit Diagnosis: Other abnormalities of gait and mobility  Other symptoms and signs involving the musculoskeletal system  Muscle weakness (generalized)     Problem List Patient Active Problem List   Diagnosis Date Noted  . Pseudogout of knee   . Pyogenic arthritis of right knee joint ([place])   . Falls frequently 05/08/2018  . Fall   . Sleep disturbance   . Post-op pain   . Hypokalemia   . Morbid obesity ([place])   . Postoperative pain   . DVT, lower extremity, distal, acute, bilateral ([place])   . Acute blood loss anemia   . Anemia of chronic disease   . Essential hypertension   . Bacteremia   . Myelopathy ([place]) 03/30/2018  . Paraplegia ([place])   . Postlaminectomy syndrome, lumbar region   . Epidural abscess   .  Abdominal distension   . Encephalopathy   . Weakness of both lower extremities   . Discitis of thoracic region   . Spinal cord stimulator status   . Staphylococcus aureus sepsis ([place]) 03/20/2018  . Bacteremia due to methicillin susceptible Staphylococcus aureus (MSSA) 03/18/2018  . Obesity 03/18/2018  . Nephrolithiasis 03/16/2018  . AKI (acute kidney injury) ([place]) 03/16/2018  .  Cognitive impairment 03/16/2018  . Chronic pain syndrome 03/16/2018  . B12 deficiency 01/28/2016  . Memory difficulty 07/23/2015  . History of cerebrovascular disease 07/23/2015  . FH: colon cancer 08/13/2014  . Hx of adenomatous colonic polyps 08/13/2014  . Chronic back pain 05/30/2012  . CAD, NATIVE VESSEL 07/17/2010  . HYPERCHOLESTEROLEMIA 10/31/2009  . SMOKELESS TOBACCO ABUSE 10/31/2009  . Obstructive sleep apnea 10/31/2009  . Essential hypertension, benign 10/31/2009  . GERD 10/31/2009    Kipp Brood, PT, DPT, [facility] Physical Therapist with [facility]  01/24/2019 11:13 AM    [facility] [address] [place], [place], [address] Phone: [phone]   Fax:  [phone]  Name: Cody Velez MRN: [id number] Date of Birth: 1946-04-18

## 2019-01-26 ENCOUNTER — Encounter (HOSPITAL_COMMUNITY): Payer: Self-pay

## 2019-01-26 ENCOUNTER — Ambulatory Visit (HOSPITAL_COMMUNITY): Payer: Medicare Other

## 2019-01-26 DIAGNOSIS — R29898 Other symptoms and signs involving the musculoskeletal system: Secondary | ICD-10-CM | POA: Diagnosis not present

## 2019-01-26 DIAGNOSIS — M6281 Muscle weakness (generalized): Secondary | ICD-10-CM | POA: Diagnosis not present

## 2019-01-26 DIAGNOSIS — R2689 Other abnormalities of gait and mobility: Secondary | ICD-10-CM

## 2019-01-26 NOTE — Therapy (Signed)
[place], [place], [address] Phone: [phone]   Fax:  [phone]  Physical Therapy Treatment  Patient Details  Name: Cody Velez MRN: [id number] Date of Birth: 1946/08/05 Referring Provider (PT): Asencion Noble, MD   Encounter Date: 01/26/2019  PT End of Session - 01/26/19 0944    Visit Number  44    Number of Visits  77    Date for PT Re-Evaluation  02/16/19   Progress note 01/24/2019 (57th visit)   Authorization Type  Primary: Medicare; Secondary: [employer] (106 visits per calendar year)    Authorization Time Period  08/15/18 - 10/14/18; NEW: 10/13/18 to 11/24/18; 11/22/18 - 01/03/19; 01/03/19 to 02/16/19; NEW: 01/24/19-03/16/19    Authorization - Visit Number  13   Starting for 2020   Authorization - Number of Visits  65    PT Start Time  0945    PT Stop Time  1027    PT Time Calculation (min)  42 min    Equipment Utilized During Treatment  Gait belt    Activity Tolerance  Patient tolerated treatment well    Behavior During Therapy  WFL for tasks assessed/performed       Past Medical History:  Diagnosis Date  . Allergic rhinitis   . Arthritis   . B12 deficiency   . Cervicogenic headache 01/28/2016  . Childhood asthma   . Chronic back pain   . Coronary atherosclerosis of native coronary artery    Mild nonobstructive CAD at catheterization January 2015  . Depression   . Essential hypertension   . Falls   . GERD (gastroesophageal reflux disease)   . History of cerebrovascular disease 07/23/2015  . History of pneumonia 02/2011  . Hyperlipidemia   . Kidney stone   . Memory difficulty 07/23/2015  . OSA (obstructive sleep apnea)    CPAP - Dr. [name]  . Prostate cancer ([place])   . PTSD (post-traumatic stress disorder)    [place]  . Rectal bleeding     Past Surgical History:  Procedure Laterality Date  . APPLICATION OF ROBOTIC ASSISTANCE FOR SPINAL PROCEDURE N/A 03/28/2018   Procedure: APPLICATION OF ROBOTIC ASSISTANCE  FOR SPINAL PROCEDURE;  Surgeon: Kristeen Miss, MD;  Location: [facility];  Service: Neurosurgery;  Laterality: N/A;  . BACK SURGERY  02/14/12   lumbar OR #7; "today redid L1L2; replaced screws; added bone from hip"  . BILATERAL KNEE ARTHROSCOPY    . COLONOSCOPY  10/15/2008   Dr. [name]: tubular adenoma   . COLONOSCOPY  12/17/2003   TDS:KAJGOT rectal and colon  . COLONOSCOPY N/A 09/05/2014   Procedure: COLONOSCOPY;  Surgeon: Daneil Dolin, MD;  [facility];  Service: Endoscopy;  Laterality: N/A;  7:30-rescheduled 9/17 to [place] notified pt  . CYSTOSCOPY WITH STENT PLACEMENT Right 01/27/2016   Procedure: CYSTOSCOPY WITH STENT PLACEMENT;  Surgeon: [name], MD;  Location: [facility];  Service: Urology;  Laterality: Right;  . CYSTOSCOPY/RETROGRADE/URETEROSCOPY/STONE EXTRACTION WITH BASKET Right 01/27/2016   Procedure: CYSTOSCOPY, RIGHT RETROGRADE, RIGHT URETEROSCOPY, STONE EXTRACTION ;  Surgeon: [name], MD;  Location: [facility];  Service: Urology;  Laterality: Right;  . ESOPHAGOGASTRODUODENOSCOPY  10/15/2008     Dr Rourk:Schatzki's ring status post dilation and disruption via 43 F Maloney dilator/ otherwise unremarkable esophagus, small hiatal hernia, multiple fundal gland polyps not manipulated, gastritis, negative H.pylori  . ESOPHAGOGASTRODUODENOSCOPY  06/21/02   LXB:WIOMB sliding hiatal hernia with mild changes of reflux esophagitis limited to gastroesophageal junction.  Noncritical ring at distal esophagus, 3 cm proximal to gastroesophageal junction/Antral gastritis  . [place]   "broke face playing softball"  . FRACTURE SURGERY     "left wrist; broke it; took spur off"  . HOLMIUM LASER APPLICATION Right 0/05/2375   Procedure: HOLMIUM LASER APPLICATION;  Surgeon: [name], MD;  Location: [facility];  Service: Urology;  Laterality: Right;  . KNEE ARTHROSCOPY Right 05/18/2018   Procedure: PARTIAL MEDIAL MENISECTOMY AND SURGICAL LAVAGE AND  CHONDROPLASTY;  Surgeon: [name], MD;  Location: [facility];  Service: Orthopedics;  Laterality: Right;  . LEFT HEART CATHETERIZATION WITH CORONARY ANGIOGRAM N/A 12/27/2013   Procedure: LEFT HEART CATHETERIZATION WITH CORONARY ANGIOGRAM;  Surgeon: [name], MD;  Location: [facility];  Service: Cardiovascular;  Laterality: N/A;  . neck epidural    . POSTERIOR LUMBAR FUSION 4 LEVEL N/A 03/28/2018   Procedure: Thoracic eight -Lumbar two- FIXATION WITH SCREW PLACEMENT, DECOMPRESSION Thoracic ten-Thoracic eleven  FOR OSTEOMYELITIS;  Surgeon: Kristeen Miss, MD;  Location: [facility];  Service: Neurosurgery;  Laterality: N/A;  . SHOULDER SURGERY Bilateral   . TEE WITHOUT CARDIOVERSION N/A 03/21/2018   Procedure: TRANSESOPHAGEAL ECHOCARDIOGRAM (TEE) WITH PROPOFOL;  Surgeon: [name], MD;  [facility];  Service: Cardiovascular;  Laterality: N/A;    There were no vitals filed for this visit.  Subjective Assessment - 01/26/19 0945    Subjective  Pt reports that he is doing well today. No back pain.     Pertinent History  T10-11 Epidural Abscess, back surgery on 03/28/18, right knee partial medial menisectomy and chondroplasty    Limitations  Standing;Walking;House hold activities;Lifting    Diagnostic tests  CT Lumbar Spine 05/12/18: "Spinal stimulator device, along with multilevel multisegment fusion hardware appear appropriately placed without adverse features."    Patient Stated Goals  To walk    Currently in Pain?  No/denies                       [facility] Adult PT Treatment/Exercise - 01/26/19 0001      Ambulation/Gait   Ambulation/Gait  Yes    Ambulation/Gait Assistance  4: Min guard;5: Supervision    Ambulation/Gait Assistance Details  RW, and w/c follow    Ambulation Distance (Feet)  256 Feet   108, 52, 96   Assistive device  Rolling walker    Gait Pattern  Decreased dorsiflexion - left;Decreased dorsiflexion - right;Trunk flexed;Decreased step length -  right;Decreased step length - left;Step-through pattern;Scissoring      Knee/Hip Exercises: Stretches   Sports administrator  Both;1 rep;30 seconds    Quad Stretch Limitations  manual assistnace from PT    Hip Flexor Stretch  Both    Hip Flexor Stretch Limitations  3 mins, prone on elbows      Knee/Hip Exercises: Machines for Strengthening   Total Gym Leg Press  multigym: 40#, x20 reps (max A for set up)      Knee/Hip Exercises: Prone   Hip Extension  Both;2 sets;10 reps    Hip Extension Limitations  knee bent             PT Education - 01/26/19 0945    Education Details  exercise technique, importance of HEP    Person(s) Educated  Patient    Methods  Explanation    Comprehension  Verbalized understanding       PT Short Term Goals - 01/24/19 1037      PT  SHORT TERM GOAL #1   Title  Patient will report understanding and regular compliance with HEP to improve strength and overall functional mobility.     Baseline  01/24/19 = pt is workign on stretches with assistance from sig other    Time  4    Period  Weeks    Status  Achieved      PT SHORT TERM GOAL #2   Title  Patient will demonstrate improvement of 1/2 MMT strength grade in all muscle groups tested to assist patient with safe transfers, ambulation, and standing.     Baseline  01/24/2019 = Met for all groups with exception of hip abductors bil since evaluation    Time  4    Period  Weeks    Status  Partially Met      PT SHORT TERM GOAL #3   Title  Patient will report or demonstrate ability to perform ambulation for 2 minutes with LRAD with no more than supervision assistance without rest break and reported no more than minimal difficulty.     Baseline  01/24/19 = amb 4:45 straight with RW and 3 standing rest breaks due to shoulders (amb 2:10 before first standing rest break)    Time  4    Period  Weeks    Status  Achieved      PT SHORT TERM GOAL #4   Title  Patient will demonstrate ability to ambulate 60 feet without rest  break using LRAD with no more than minimal assistance.    Time  2    Period  Weeks    Status  Achieved        PT Long Term Goals - 01/24/19 1046      PT LONG TERM GOAL #1   Title  Patient will report or demonstrate ability to perform ambulation for 5 minutes with LRAD with no more than supervision assistance without rest break and reported no more than minimal difficulty.     Baseline  01/24/19 = amb 4:45 straight with RW and 3 standing rest breaks due to shoulders (amb 2:10 before first standing rest break)    Time  6    Period  Weeks    Status  On-going      PT LONG TERM GOAL #2   Title  Patient will report ability to stand at a support surface for 10 minutes without a rest break in order to perform household chores with increased ease.     Baseline  01/24/19 = reports he can do this with support surface    Time  6    Period  Weeks    Status  Achieved      PT LONG TERM GOAL #3   Title  Patient will demonstrate ability to ambulate 120 feet without rest break using LRAD with no more than minimal assistance.    Baseline  01/24/19 = amb 54 ft this date at 2 minutes, and 58 ft at 2:10 seconds before 1st standign rest break, min guard/assist with RW    Time  6    Period  Weeks    Status  On-going      PT LONG TERM GOAL #4   Title  Patient will perform 5x sit to stand from standard chair with good control and single UE assist in 16 seconds or less to demonstrate improved LE strength for functional transfers.    Baseline  01/24/19 =  from standard chair with UE support and RW 19.8 on first  attempt; 14.9 on seconds attempt    Time  6    Period  Weeks    Status  Achieved      PT LONG TERM GOAL #5   Title  Patient will report regular participation in exercise program at [facility], at least 1x/week, (he is already a Merchandiser, retail) in aquatic setting or on safe/approriate gym equipment to increase independence with strengthening    Time  4    Period  Weeks    Status  New    Target Date  02/21/19       Additional Long Term Goals   Additional Long Term Goals  Yes      PT LONG TERM GOAL #6   Title  Patient will be participating in exercise program at [facility] 2x/week in addition to therapy sessions to demonstrate increased independence and consistency with regular exercise for increased health/wellness.    Time  7    Period  Weeks    Status  New    Target Date  03/14/19            Plan - 01/26/19 1029    Clinical Impression Statement  Continued with established POC focusing on improving gait, strength, and overall flexibility. He performed gait well this date, being limited only by wrist pain and mild knee shakiness. Able to get pt on multigym leg press with max A for setup and pt able to perform with 40#. He was able to do 20 reps before needing to stop due to L knee pain, but this did not persist once exercise over. Ended with prone hip flexor and quad stretching on mat table as well as prone hip ext for glute strength. Pt fatigued at EOS.     Rehab Potential  Fair    Clinical Impairments Affecting Rehab Potential  Positive: Highly motivated; Negative: Comorbidities    PT Frequency  3x / week   3x/week for 3 weeks; 2x/week for 4 weeks in march   PT Duration  Other (comment)   7 weeks   PT Treatment/Interventions  ADLs/Self Care Home Management;Aquatic Therapy;DME Instruction;Gait training;Functional mobility training;Stair training;Therapeutic activities;Therapeutic exercise;Balance training;Neuromuscular re-education;Patient/family education;Orthotic Fit/Training;Wheelchair mobility training;Manual techniques;Passive range of motion;Energy conservation;Dry needling;Splinting;Taping    PT Next Visit Plan  contiue leg press and/or total gym leg press; Follow up on prone hip flexor stretch. Perform prone SLR review LE stretching for HEP. Continue with functional standing strengthening in // bars. Encourage to participate in exercise at [facility] and to use NuStep machine there. Review  importance of strengthening at home and of stretching for improve posture and gait quality. Gait training with RW and wc follow, hurdle step over in // bars and step ups.    PT Home Exercise Plan  08/16/18:  seated DF/PF, LAQ, marching, abd with theraband and bridge; 11/13: HS and calf stretching (says he has been performing this at home); 01/24/2019 = prone hip flexor stretch    Consulted and Agree with Plan of Care  Patient       Patient will benefit from skilled therapeutic intervention in order to improve the following deficits and impairments:  Abnormal gait, Decreased balance, Decreased endurance, Decreased mobility, Difficulty walking, Hypomobility, Decreased range of motion, Decreased activity tolerance, Decreased strength, Impaired flexibility  Visit Diagnosis: Other abnormalities of gait and mobility  Other symptoms and signs involving the musculoskeletal system  Muscle weakness (generalized)     Problem List Patient Active Problem List   Diagnosis Date Noted  . Pseudogout of knee   .  Pyogenic arthritis of right knee joint ([place])   . Falls frequently 05/08/2018  . Fall   . Sleep disturbance   . Post-op pain   . Hypokalemia   . Morbid obesity ([place])   . Postoperative pain   . DVT, lower extremity, distal, acute, bilateral ([place])   . Acute blood loss anemia   . Anemia of chronic disease   . Essential hypertension   . Bacteremia   . Myelopathy ([place]) 03/30/2018  . Paraplegia ([place])   . Postlaminectomy syndrome, lumbar region   . Epidural abscess   . Abdominal distension   . Encephalopathy   . Weakness of both lower extremities   . Discitis of thoracic region   . Spinal cord stimulator status   . Staphylococcus aureus sepsis ([place]) 03/20/2018  . Bacteremia due to methicillin susceptible Staphylococcus aureus (MSSA) 03/18/2018  . Obesity 03/18/2018  . Nephrolithiasis 03/16/2018  . AKI (acute kidney injury) ([place]) 03/16/2018  . Cognitive impairment 03/16/2018  . Chronic  pain syndrome 03/16/2018  . B12 deficiency 01/28/2016  . Memory difficulty 07/23/2015  . History of cerebrovascular disease 07/23/2015  . FH: colon cancer 08/13/2014  . Hx of adenomatous colonic polyps 08/13/2014  . Chronic back pain 05/30/2012  . CAD, NATIVE VESSEL 07/17/2010  . HYPERCHOLESTEROLEMIA 10/31/2009  . SMOKELESS TOBACCO ABUSE 10/31/2009  . Obstructive sleep apnea 10/31/2009  . Essential hypertension, benign 10/31/2009  . GERD 10/31/2009        Geraldine Solar PT, DPT   [facility] [address] [place], [place], [address] Phone: [phone]   Fax:  [phone]  Name: Cody Velez MRN: [id number] Date of Birth: May 10, 1946

## 2019-01-29 ENCOUNTER — Ambulatory Visit (HOSPITAL_COMMUNITY): Payer: Medicare Other

## 2019-01-29 ENCOUNTER — Encounter (HOSPITAL_COMMUNITY): Payer: Self-pay

## 2019-01-29 DIAGNOSIS — M6281 Muscle weakness (generalized): Secondary | ICD-10-CM | POA: Diagnosis not present

## 2019-01-29 DIAGNOSIS — R2689 Other abnormalities of gait and mobility: Secondary | ICD-10-CM | POA: Diagnosis not present

## 2019-01-29 DIAGNOSIS — R29898 Other symptoms and signs involving the musculoskeletal system: Secondary | ICD-10-CM

## 2019-01-29 NOTE — Therapy (Signed)
[place], [place], [address] Phone: [phone]   Fax:  [phone]  Physical Therapy Treatment  Patient Details  Name: Cody Velez MRN: [id number] Date of Birth: 1946-11-02 Referring Provider (PT): Asencion Noble, MD   Encounter Date: 01/29/2019  PT End of Session - 01/29/19 0859    Visit Number  78    Number of Visits  89    Date for PT Re-Evaluation  02/16/19   Progress note 01/24/2019 (57th visit)   Authorization Type  Primary: Medicare; Secondary: [employer] (65 visits per calendar year)    Authorization Time Period  08/15/18 - 10/14/18; NEW: 10/13/18 to 11/24/18; 11/22/18 - 01/03/19; 01/03/19 to 02/16/19; NEW: 01/24/19-03/16/19    Authorization - Visit Number  14   Starting for 2020   Authorization - Number of Visits  9    PT Start Time  0900    PT Stop Time  0942    PT Time Calculation (min)  42 min    Equipment Utilized During Treatment  Gait belt    Activity Tolerance  Patient tolerated treatment well    Behavior During Therapy  [facility] for tasks assessed/performed       Past Medical History:  Diagnosis Date  . Allergic rhinitis   . Arthritis   . B12 deficiency   . Cervicogenic headache 01/28/2016  . Childhood asthma   . Chronic back pain   . Coronary atherosclerosis of native coronary artery    Mild nonobstructive CAD at catheterization January 2015  . Depression   . Essential hypertension   . Falls   . GERD (gastroesophageal reflux disease)   . History of cerebrovascular disease 07/23/2015  . History of pneumonia 02/2011  . Hyperlipidemia   . Kidney stone   . Memory difficulty 07/23/2015  . OSA (obstructive sleep apnea)    CPAP - Dr. [name]  . Prostate cancer ([place])   . PTSD (post-traumatic stress disorder)    [place]  . Rectal bleeding     Past Surgical History:  Procedure Laterality Date  . APPLICATION OF ROBOTIC ASSISTANCE FOR SPINAL PROCEDURE N/A 03/28/2018   Procedure: APPLICATION OF ROBOTIC ASSISTANCE  FOR SPINAL PROCEDURE;  Surgeon: Kristeen Miss, MD;  Location: [place];  Service: Neurosurgery;  Laterality: N/A;  . BACK SURGERY  02/14/12   lumbar OR #7; "today redid L1L2; replaced screws; added bone from hip"  . BILATERAL KNEE ARTHROSCOPY    . COLONOSCOPY  10/15/2008   Dr. [name]: tubular adenoma   . COLONOSCOPY  12/17/2003   ONG:EXBMWU rectal and colon  . COLONOSCOPY N/A 09/05/2014   Procedure: COLONOSCOPY;  Surgeon: Daneil Dolin, MD;  [facility];  Service: Endoscopy;  Laterality: N/A;  7:30-rescheduled 9/17 to [place] notified pt  . CYSTOSCOPY WITH STENT PLACEMENT Right 01/27/2016   Procedure: CYSTOSCOPY WITH STENT PLACEMENT;  Surgeon: [name], MD;  [facility];  Service: Urology;  Laterality: Right;  . CYSTOSCOPY/RETROGRADE/URETEROSCOPY/STONE EXTRACTION WITH BASKET Right 01/27/2016   Procedure: CYSTOSCOPY, RIGHT RETROGRADE, RIGHT URETEROSCOPY, STONE EXTRACTION ;  Surgeon: [name], MD;  Location[facility];  Service: Urology;  Laterality: Right;  . ESOPHAGOGASTRODUODENOSCOPY  10/15/2008     Dr Rourk:Schatzki's ring status post dilation and disruption via 62 F Maloney dilator/ otherwise unremarkable esophagus, small hiatal hernia, multiple fundal gland polyps not manipulated, gastritis, negative H.pylori  . ESOPHAGOGASTRODUODENOSCOPY  06/21/02   XLK:GMWNU sliding hiatal hernia with mild changes of reflux esophagitis limited to gastroesophageal junction.  Noncritical ring at distal esophagus, 3 cm proximal to gastroesophageal junction/Antral gastritis  . [place]   "broke face playing softball"  . FRACTURE SURGERY     "left wrist; broke it; took spur off"  . HOLMIUM LASER APPLICATION Right 07/21/6414   Procedure: HOLMIUM LASER APPLICATION;  Surgeon: [name], MD;  Location[facility];  Service: Urology;  Laterality: Right;  . KNEE ARTHROSCOPY Right 05/18/2018   Procedure: PARTIAL MEDIAL MENISECTOMY AND SURGICAL LAVAGE AND  CHONDROPLASTY;  Surgeon: [name], MD;  Location: [place];  Service: Orthopedics;  Laterality: Right;  . LEFT HEART CATHETERIZATION WITH CORONARY ANGIOGRAM N/A 12/27/2013   Procedure: LEFT HEART CATHETERIZATION WITH CORONARY ANGIOGRAM;  Surgeon: [name], MD;  [facility];  Service: Cardiovascular;  Laterality: N/A;  . neck epidural    . POSTERIOR LUMBAR FUSION 4 LEVEL N/A 03/28/2018   Procedure: Thoracic eight -Lumbar two- FIXATION WITH SCREW PLACEMENT, DECOMPRESSION Thoracic ten-Thoracic eleven  FOR OSTEOMYELITIS;  Surgeon: Kristeen Miss, MD;  Location: [place];  Service: Neurosurgery;  Laterality: N/A;  . SHOULDER SURGERY Bilateral   . TEE WITHOUT CARDIOVERSION N/A 03/21/2018   Procedure: TRANSESOPHAGEAL ECHOCARDIOGRAM (TEE) WITH PROPOFOL;  Surgeon: [name], MD;  [facility];  Service: Cardiovascular;  Laterality: N/A;    There were no vitals filed for this visit.  Subjective Assessment - 01/29/19 0900    Subjective  Pt states that his L knee was sore and buckled this morning which he thinks was due to the leg press.    Pertinent History  T10-11 Epidural Abscess, back surgery on 03/28/18, right knee partial medial menisectomy and chondroplasty    Limitations  Standing;Walking;House hold activities;Lifting    Diagnostic tests  CT Lumbar Spine 05/12/18: "Spinal stimulator device, along with multilevel multisegment fusion hardware appear appropriately placed without adverse features."    Patient Stated Goals  To walk    Currently in Pain?  No/denies            [facility] Adult PT Treatment/Exercise - 01/29/19 0001      Ambulation/Gait   Ambulation/Gait  Yes    Ambulation/Gait Assistance  4: Min guard;5: Supervision    Ambulation/Gait Assistance Details  RW, w/c follow for safety    Ambulation Distance (Feet)  226 Feet   60, 80, 86   Assistive device  Rolling walker    Gait Pattern  Decreased dorsiflexion - left;Decreased dorsiflexion - right;Trunk  flexed;Decreased step length - right;Decreased step length - left;Step-through pattern;Scissoring      Knee/Hip Exercises: Stretches   Sports administrator  Both;1 rep;30 seconds    Quad Stretch Limitations  manual assistnace from PT    Hip Flexor Stretch  Both    Hip Flexor Stretch Limitations  51mns, POE      Knee/Hip Exercises: Seated   Sit to Sand  10 reps;without UE support   5" eccentric lower     Knee/Hip Exercises: Supine   Single Leg Bridge  Both;2 sets;10 reps               PT Short Term Goals - 01/24/19 1037      PT SHORT TERM GOAL #1   Title  Patient will report understanding and regular compliance with HEP to improve strength and overall functional mobility.     Baseline  01/24/19 = pt is workign on stretches with assistance from sig other    Time  4    Period  Weeks  Status  Achieved      PT SHORT TERM GOAL #2   Title  Patient will demonstrate improvement of 1/2 MMT strength grade in all muscle groups tested to assist patient with safe transfers, ambulation, and standing.     Baseline  01/24/2019 = Met for all groups with exception of hip abductors bil since evaluation    Time  4    Period  Weeks    Status  Partially Met      PT SHORT TERM GOAL #3   Title  Patient will report or demonstrate ability to perform ambulation for 2 minutes with LRAD with no more than supervision assistance without rest break and reported no more than minimal difficulty.     Baseline  01/24/19 = amb 4:45 straight with RW and 3 standing rest breaks due to shoulders (amb 2:10 before first standing rest break)    Time  4    Period  Weeks    Status  Achieved      PT SHORT TERM GOAL #4   Title  Patient will demonstrate ability to ambulate 60 feet without rest break using LRAD with no more than minimal assistance.    Time  2    Period  Weeks    Status  Achieved        PT Long Term Goals - 01/24/19 1046      PT LONG TERM GOAL #1   Title  Patient will report or demonstrate ability to  perform ambulation for 5 minutes with LRAD with no more than supervision assistance without rest break and reported no more than minimal difficulty.     Baseline  01/24/19 = amb 4:45 straight with RW and 3 standing rest breaks due to shoulders (amb 2:10 before first standing rest break)    Time  6    Period  Weeks    Status  On-going      PT LONG TERM GOAL #2   Title  Patient will report ability to stand at a support surface for 10 minutes without a rest break in order to perform household chores with increased ease.     Baseline  01/24/19 = reports he can do this with support surface    Time  6    Period  Weeks    Status  Achieved      PT LONG TERM GOAL #3   Title  Patient will demonstrate ability to ambulate 120 feet without rest break using LRAD with no more than minimal assistance.    Baseline  01/24/19 = amb 54 ft this date at 2 minutes, and 58 ft at 2:10 seconds before 1st standign rest break, min guard/assist with RW    Time  6    Period  Weeks    Status  On-going      PT LONG TERM GOAL #4   Title  Patient will perform 5x sit to stand from standard chair with good control and single UE assist in 16 seconds or less to demonstrate improved LE strength for functional transfers.    Baseline  01/24/19 =  from standard chair with UE support and RW 19.8 on first attempt; 14.9 on seconds attempt    Time  6    Period  Weeks    Status  Achieved      PT LONG TERM GOAL #5   Title  Patient will report regular participation in exercise program at [facility], at least 1x/week, (he is already a Merchandiser, retail) in  aquatic setting or on safe/approriate gym equipment to increase independence with strengthening    Time  4    Period  Weeks    Status  New    Target Date  02/21/19      Additional Long Term Goals   Additional Long Term Goals  Yes      PT LONG TERM GOAL #6   Title  Patient will be participating in exercise program at [facility] 2x/week in addition to therapy sessions to demonstrate increased  independence and consistency with regular exercise for increased health/wellness.    Time  7    Period  Weeks    Status  New    Target Date  03/14/19            Plan - 01/29/19 2956    Clinical Impression Statement  Held on the leg press this date due to pt reporting some knee pain following with subsequent buckling this AM, which he contributed to the leg press. Continued with gait training, BLE stretching, and hip ext strengthening. Added single leg bridges and pt challenged AEB difficulty performing through full ROM. Added eccentric STS this date without UE assist and pt tolerated well, just required min cues for form and control. Continue as planned, progressing as able.     Rehab Potential  Fair    Clinical Impairments Affecting Rehab Potential  Positive: Highly motivated; Negative: Comorbidities    PT Frequency  3x / week   3x/week for 3 weeks; 2x/week for 4 weeks in march   PT Duration  Other (comment)   7 weeks   PT Treatment/Interventions  ADLs/Self Care Home Management;Aquatic Therapy;DME Instruction;Gait training;Functional mobility training;Stair training;Therapeutic activities;Therapeutic exercise;Balance training;Neuromuscular re-education;Patient/family education;Orthotic Fit/Training;Wheelchair mobility training;Manual techniques;Passive range of motion;Energy conservation;Dry needling;Splinting;Taping    PT Next Visit Plan  consider resuming leg press and/or total gym leg press; Follow up on prone hip flexor stretch. Perform prone SLR review LE stretching for HEP. Continue with functional standing strengthening in // bars. Encourage to participate in exercise at [facility] and to use NuStep machine there. Review importance of strengthening at home and of stretching for improve posture and gait quality. Gait training with RW and wc follow, hurdle step over in // bars and step ups.    PT Home Exercise Plan  08/16/18:  seated DF/PF, LAQ, marching, abd with theraband and bridge; 11/13:  HS and calf stretching (says he has been performing this at home); 01/24/2019 = prone hip flexor stretch    Consulted and Agree with Plan of Care  Patient       Patient will benefit from skilled therapeutic intervention in order to improve the following deficits and impairments:  Abnormal gait, Decreased balance, Decreased endurance, Decreased mobility, Difficulty walking, Hypomobility, Decreased range of motion, Decreased activity tolerance, Decreased strength, Impaired flexibility  Visit Diagnosis: Other abnormalities of gait and mobility  Other symptoms and signs involving the musculoskeletal system  Muscle weakness (generalized)     Problem List Patient Active Problem List   Diagnosis Date Noted  . Pseudogout of knee   . Pyogenic arthritis of right knee joint ([place])   . Falls frequently 05/08/2018  . Fall   . Sleep disturbance   . Post-op pain   . Hypokalemia   . Morbid obesity ([place])   . Postoperative pain   . DVT, lower extremity, distal, acute, bilateral ([place])   . Acute blood loss anemia   . Anemia of chronic disease   . Essential hypertension   .  Bacteremia   . Myelopathy ([place]) 03/30/2018  . Paraplegia ([place])   . Postlaminectomy syndrome, lumbar region   . Epidural abscess   . Abdominal distension   . Encephalopathy   . Weakness of both lower extremities   . Discitis of thoracic region   . Spinal cord stimulator status   . Staphylococcus aureus sepsis ([place]) 03/20/2018  . Bacteremia due to methicillin susceptible Staphylococcus aureus (MSSA) 03/18/2018  . Obesity 03/18/2018  . Nephrolithiasis 03/16/2018  . AKI (acute kidney injury) ([place]) 03/16/2018  . Cognitive impairment 03/16/2018  . Chronic pain syndrome 03/16/2018  . B12 deficiency 01/28/2016  . Memory difficulty 07/23/2015  . History of cerebrovascular disease 07/23/2015  . FH: colon cancer 08/13/2014  . Hx of adenomatous colonic polyps 08/13/2014  . Chronic back pain 05/30/2012  . CAD, NATIVE VESSEL  07/17/2010  . HYPERCHOLESTEROLEMIA 10/31/2009  . SMOKELESS TOBACCO ABUSE 10/31/2009  . Obstructive sleep apnea 10/31/2009  . Essential hypertension, benign 10/31/2009  . GERD 10/31/2009       Geraldine Solar PT, DPT  [facility] [address] [place], [place], [address] Phone: [phone]   Fax:  [phone]  Name: Cody Velez MRN: [id number] Date of Birth: November 08, 1946

## 2019-01-31 ENCOUNTER — Ambulatory Visit (HOSPITAL_COMMUNITY): Payer: Medicare Other

## 2019-01-31 ENCOUNTER — Encounter (HOSPITAL_COMMUNITY): Payer: Self-pay

## 2019-01-31 DIAGNOSIS — R2689 Other abnormalities of gait and mobility: Secondary | ICD-10-CM

## 2019-01-31 DIAGNOSIS — R29898 Other symptoms and signs involving the musculoskeletal system: Secondary | ICD-10-CM | POA: Diagnosis not present

## 2019-01-31 DIAGNOSIS — M6281 Muscle weakness (generalized): Secondary | ICD-10-CM | POA: Diagnosis not present

## 2019-01-31 NOTE — Therapy (Signed)
[place], [place], [address] Phone: [phone]   Fax:  [phone]  Physical Therapy Treatment  Patient Details  Name: Cody Velez MRN: [id number] Date of Birth: 07/04/1946 Referring Provider (PT): Asencion Noble, MD   Encounter Date: 01/31/2019  PT End of Session - 01/31/19 1041    Visit Number  60    Number of Visits  48    Date for PT Re-Evaluation  02/16/19   MInireassess complete on 01/24/19 visit #57   Authorization Type  Primary: Medicare; Secondary: BCBS (33 visits per calendar year)    Authorization Time Period  08/15/18 - 10/14/18; NEW: 10/13/18 to 11/24/18; 11/22/18 - 01/03/19; 01/03/19 to 02/16/19; NEW: 01/24/19-03/16/19    Authorization - Visit Number  15   starting 2020   Authorization - Number of Visits  37    PT Start Time  0950    PT Stop Time  1036    PT Time Calculation (min)  46 min    Equipment Utilized During Treatment  Gait belt    Activity Tolerance  Patient tolerated treatment well    Behavior During Therapy  WFL for tasks assessed/performed       Past Medical History:  Diagnosis Date  . Allergic rhinitis   . Arthritis   . B12 deficiency   . Cervicogenic headache 01/28/2016  . Childhood asthma   . Chronic back pain   . Coronary atherosclerosis of native coronary artery    Mild nonobstructive CAD at catheterization January 2015  . Depression   . Essential hypertension   . Falls   . GERD (gastroesophageal reflux disease)   . History of cerebrovascular disease 07/23/2015  . History of pneumonia 02/2011  . Hyperlipidemia   . Kidney stone   . Memory difficulty 07/23/2015  . OSA (obstructive sleep apnea)    CPAP - Dr. [name]  . Prostate cancer ([place])   . PTSD (post-traumatic stress disorder)    [place]  . Rectal bleeding     Past Surgical History:  Procedure Laterality Date  . APPLICATION OF ROBOTIC ASSISTANCE FOR SPINAL PROCEDURE N/A 03/28/2018   Procedure: APPLICATION OF ROBOTIC  ASSISTANCE FOR SPINAL PROCEDURE;  Surgeon: Kristeen Miss, MD;  Location: [facility];  Service: Neurosurgery;  Laterality: N/A;  . BACK SURGERY  02/14/12   lumbar OR #7; "today redid L1L2; replaced screws; added bone from hip"  . BILATERAL KNEE ARTHROSCOPY    . COLONOSCOPY  10/15/2008   Dr. [name]: tubular adenoma   . COLONOSCOPY  12/17/2003   DEY:CXKGYJ rectal and colon  . COLONOSCOPY N/A 09/05/2014   Procedure: COLONOSCOPY;  Surgeon: Daneil Dolin, MD;  [facility];  Service: Endoscopy;  Laterality: N/A;  7:30-rescheduled 9/17 to [place] notified pt  . CYSTOSCOPY WITH STENT PLACEMENT Right 01/27/2016   Procedure: CYSTOSCOPY WITH STENT PLACEMENT;  Surgeon: [name], MD;  Location: [facility];  Service: Urology;  Laterality: Right;  . CYSTOSCOPY/RETROGRADE/URETEROSCOPY/STONE EXTRACTION WITH BASKET Right 01/27/2016   Procedure: CYSTOSCOPY, RIGHT RETROGRADE, RIGHT URETEROSCOPY, STONE EXTRACTION ;  Surgeon: [name], MD;  Location: [facility];  Service: Urology;  Laterality: Right;  . ESOPHAGOGASTRODUODENOSCOPY  10/15/2008     Dr Rourk:Schatzki's ring status post dilation and disruption via 74 F Maloney dilator/ otherwise unremarkable esophagus, small hiatal hernia, multiple fundal gland polyps not manipulated, gastritis, negative H.pylori  . ESOPHAGOGASTRODUODENOSCOPY  06/21/02   [facility]:DJSHF sliding hiatal hernia with mild changes of reflux esophagitis limited to gastroesophageal junction.  Noncritical ring at distal esophagus, 3 cm proximal to gastroesophageal junction/Antral gastritis  . [place]   "broke face playing softball"  . FRACTURE SURGERY     "left wrist; broke it; took spur off"  . HOLMIUM LASER APPLICATION Right 07/20/8562   Procedure: HOLMIUM LASER APPLICATION;  Surgeon: [name], MD;  Location: [facility];  Service: Urology;  Laterality: Right;  . KNEE ARTHROSCOPY Right 05/18/2018   Procedure: PARTIAL MEDIAL MENISECTOMY AND SURGICAL LAVAGE  AND CHONDROPLASTY;  Surgeon: [name], MD;  Location: [facility];  Service: Orthopedics;  Laterality: Right;  . LEFT HEART CATHETERIZATION WITH CORONARY ANGIOGRAM N/A 12/27/2013   Procedure: LEFT HEART CATHETERIZATION WITH CORONARY ANGIOGRAM;  Surgeon: [name], MD;  Location: [facility] C[facility];  Service: Cardiovascular;  Laterality: N/A;  . neck epidural    . POSTERIOR LUMBAR FUSION 4 LEVEL N/A 03/28/2018   Procedure: Thoracic eight -Lumbar two- FIXATION WITH SCREW PLACEMENT, DECOMPRESSION Thoracic ten-Thoracic eleven  FOR OSTEOMYELITIS;  Surgeon: Kristeen Miss, MD;  Location: [facility];  Service: Neurosurgery;  Laterality: N/A;  . SHOULDER SURGERY Bilateral   . TEE WITHOUT CARDIOVERSION N/A 03/21/2018   Procedure: TRANSESOPHAGEAL ECHOCARDIOGRAM (TEE) WITH PROPOFOL;  Surgeon: [name], MD;  [facility];  Service: Cardiovascular;  Laterality: N/A;    There were no vitals filed for this visit.  Subjective Assessment - 01/31/19 1013    Subjective  No pain just feels weak today.      Pertinent History  T10-11 Epidural Abscess, back surgery on 03/28/18, right knee partial medial menisectomy and chondroplasty    Patient Stated Goals  To walk    Currently in Pain?  No/denies         [facility] PT Assessment - 01/31/19 0001      Assessment   Medical Diagnosis  T10-11 Epidural Abscess "Paraplegia"    Referring Provider (PT)  Asencion Noble, MD    Onset Date/Surgical Date  03/28/18    Next MD Visit  Infectious disease MD in 2 months, one comming up with Dr. Willey Blade as well, [facility] in [facility] for follow up on thursday next week for headaces and pain in neck and back of head    Prior Therapy  Yes, inpatient rehab and home health which patient reported ended 08/04/18      Observation/Other Assessments   Focus on Therapeutic Outcomes (FOTO)   --                   [facility] Adult PT Treatment/Exercise - 01/31/19 0001      Ambulation/Gait   Ambulation/Gait  Yes    Ambulation/Gait  Assistance  4: Min guard;5: Supervision    Ambulation/Gait Assistance Details  RW and WC pushed behind for safety    Ambulation Distance (Feet)  226 Feet   3 sets: 85 x 2, 56 ft; 8 hurdles    Assistive device  Rolling walker    Gait Pattern  Decreased dorsiflexion - left;Decreased dorsiflexion - right;Trunk flexed;Decreased step length - right;Decreased step length - left;Step-through pattern;Scissoring      Knee/Hip Exercises: Stretches   Sports administrator  Both;3 reps;30 seconds    Quad Stretch Limitations  manual assistnace from PT    Hip Flexor Stretch  Both    Hip Flexor Stretch Limitations  68mns, POE      Knee/Hip Exercises: Seated   Sit to Sand  10 reps;without UE support   eccentric control     Knee/Hip Exercises:  Supine   Single Leg Bridge  Both;2 sets;10 reps               PT Short Term Goals - 01/24/19 1037      PT SHORT TERM GOAL #1   Title  Patient will report understanding and regular compliance with HEP to improve strength and overall functional mobility.     Baseline  01/24/19 = pt is workign on stretches with assistance from sig other    Time  4    Period  Weeks    Status  Achieved      PT SHORT TERM GOAL #2   Title  Patient will demonstrate improvement of 1/2 MMT strength grade in all muscle groups tested to assist patient with safe transfers, ambulation, and standing.     Baseline  01/24/2019 = Met for all groups with exception of hip abductors bil since evaluation    Time  4    Period  Weeks    Status  Partially Met      PT SHORT TERM GOAL #3   Title  Patient will report or demonstrate ability to perform ambulation for 2 minutes with LRAD with no more than supervision assistance without rest break and reported no more than minimal difficulty.     Baseline  01/24/19 = amb 4:45 straight with RW and 3 standing rest breaks due to shoulders (amb 2:10 before first standing rest break)    Time  4    Period  Weeks    Status  Achieved      PT SHORT TERM GOAL #4    Title  Patient will demonstrate ability to ambulate 60 feet without rest break using LRAD with no more than minimal assistance.    Time  2    Period  Weeks    Status  Achieved        PT Long Term Goals - 01/24/19 1046      PT LONG TERM GOAL #1   Title  Patient will report or demonstrate ability to perform ambulation for 5 minutes with LRAD with no more than supervision assistance without rest break and reported no more than minimal difficulty.     Baseline  01/24/19 = amb 4:45 straight with RW and 3 standing rest breaks due to shoulders (amb 2:10 before first standing rest break)    Time  6    Period  Weeks    Status  On-going      PT LONG TERM GOAL #2   Title  Patient will report ability to stand at a support surface for 10 minutes without a rest break in order to perform household chores with increased ease.     Baseline  01/24/19 = reports he can do this with support surface    Time  6    Period  Weeks    Status  Achieved      PT LONG TERM GOAL #3   Title  Patient will demonstrate ability to ambulate 120 feet without rest break using LRAD with no more than minimal assistance.    Baseline  01/24/19 = amb 54 ft this date at 2 minutes, and 58 ft at 2:10 seconds before 1st standign rest break, min guard/assist with RW    Time  6    Period  Weeks    Status  On-going      PT LONG TERM GOAL #4   Title  Patient will perform 5x sit to stand from standard chair with good  control and single UE assist in 16 seconds or less to demonstrate improved LE strength for functional transfers.    Baseline  01/24/19 =  from standard chair with UE support and RW 19.8 on first attempt; 14.9 on seconds attempt    Time  6    Period  Weeks    Status  Achieved      PT LONG TERM GOAL #5   Title  Patient will report regular participation in exercise program at [facility], at least 1x/week, (he is already a Merchandiser, retail) in aquatic setting or on safe/approriate gym equipment to increase independence with  strengthening    Time  4    Period  Weeks    Status  New    Target Date  02/21/19      Additional Long Term Goals   Additional Long Term Goals  Yes      PT LONG TERM GOAL #6   Title  Patient will be participating in exercise program at [facility] 2x/week in addition to therapy sessions to demonstrate increased independence and consistency with regular exercise for increased health/wellness.    Time  7    Period  Weeks    Status  New    Target Date  03/14/19            Plan - 01/31/19 1155    Clinical Impression Statement  Continue wiht gait training, BLE stretching and hip extension strengthening.  Pt improving hip flexor strength and confidence noted with increased ease with hurdles during gait training today.  Pt required 3 sets to complete 270f due to fatigue.  Therapist facilitation to assure correct form and mechanics with single leg bridges for maximal gluteal strenghtening.  Pt able to complete STS without HHA, cueing for mechanics and assistance for control.  No reports of pain through session.      Rehab Potential  Fair    Clinical Impairments Affecting Rehab Potential  Positive: Highly motivated; Negative: Comorbidities    PT Frequency  3x / week   3x/week for 3 weeks; 2x/week for 4 weeks in march   PT Duration  Other (comment)   7 weeks   PT Treatment/Interventions  ADLs/Self Care Home Management;Aquatic Therapy;DME Instruction;Gait training;Functional mobility training;Stair training;Therapeutic activities;Therapeutic exercise;Balance training;Neuromuscular re-education;Patient/family education;Orthotic Fit/Training;Wheelchair mobility training;Manual techniques;Passive range of motion;Energy conservation;Dry needling;Splinting;Taping    PT Next Visit Plan  consider resuming leg press and/or total gym leg press; Follow up on prone hip flexor stretch. Perform prone SLR review LE stretching for HEP. Continue with functional standing strengthening in // bars. [facility]. Review importance of strengthening at home and of stretching for improve posture and gait quality. Gait training with RW and wc follow, hurdle step over in // bars and step ups.    PT Home Exercise Plan  08/16/18:  seated DF/PF, LAQ, marching, abd with theraband and bridge; 11/13: HS and calf stretching (says he has been performing this at home); 01/24/2019 = prone hip flexor stretch       Patient will benefit from skilled therapeutic intervention in order to improve the following deficits and impairments:  Abnormal gait, Decreased balance, Decreased endurance, Decreased mobility, Difficulty walking, Hypomobility, Decreased range of motion, Decreased activity tolerance, Decreased strength, Impaired flexibility  Visit Diagnosis: Other abnormalities of gait and mobility  Other symptoms and signs involving the musculoskeletal system  Muscle weakness (generalized)     Problem List Patient Active Problem List  Diagnosis Date Noted  . Pseudogout of knee   . Pyogenic arthritis of right knee joint ([place])   . Falls frequently 05/08/2018  . Fall   . Sleep disturbance   . Post-op pain   . Hypokalemia   . Morbid obesity ([place])   . Postoperative pain   . DVT, lower extremity, distal, acute, bilateral ([place])   . Acute blood loss anemia   . Anemia of chronic disease   . Essential hypertension   . Bacteremia   . Myelopathy ([place]) 03/30/2018  . Paraplegia ([place])   . Postlaminectomy syndrome, lumbar region   . Epidural abscess   . Abdominal distension   . Encephalopathy   . Weakness of both lower extremities   . Discitis of thoracic region   . Spinal cord stimulator status   . Staphylococcus aureus sepsis ([place]) 03/20/2018  . Bacteremia due to methicillin susceptible Staphylococcus aureus (MSSA) 03/18/2018  . Obesity 03/18/2018  . Nephrolithiasis 03/16/2018  . AKI (acute kidney injury) ([place]) 03/16/2018  . Cognitive impairment  03/16/2018  . Chronic pain syndrome 03/16/2018  . B12 deficiency 01/28/2016  . Memory difficulty 07/23/2015  . History of cerebrovascular disease 07/23/2015  . FH: colon cancer 08/13/2014  . Hx of adenomatous colonic polyps 08/13/2014  . Chronic back pain 05/30/2012  . CAD, NATIVE VESSEL 07/17/2010  . HYPERCHOLESTEROLEMIA 10/31/2009  . SMOKELESS TOBACCO ABUSE 10/31/2009  . Obstructive sleep apnea 10/31/2009  . Essential hypertension, benign 10/31/2009  . GERD 10/31/2009   Ihor Austin, [place]; [place]  Aldona Lento 01/31/2019, 12:18 PM  [facility] [address] [place], [place], [address] Phone: [phone]   Fax:  [phone]  Name: Cody Velez MRN: [id number] Date of Birth: 1946-01-10

## 2019-02-02 ENCOUNTER — Ambulatory Visit (HOSPITAL_COMMUNITY): Payer: Medicare Other

## 2019-02-02 ENCOUNTER — Other Ambulatory Visit: Payer: Self-pay

## 2019-02-02 DIAGNOSIS — M6281 Muscle weakness (generalized): Secondary | ICD-10-CM

## 2019-02-02 DIAGNOSIS — R29898 Other symptoms and signs involving the musculoskeletal system: Secondary | ICD-10-CM

## 2019-02-02 DIAGNOSIS — R2689 Other abnormalities of gait and mobility: Secondary | ICD-10-CM

## 2019-02-02 NOTE — Therapy (Signed)
[place], [place], [address] Phone: [phone]   Fax:  [phone]  Physical Therapy Treatment  Patient Details  Name: Cody Velez MRN: [id number] Date of Birth: Feb 18, 1946 Referring Provider (PT): Asencion Noble, MD   Encounter Date: 02/02/2019  PT End of Session - 02/02/19 1043    Visit Number  61    Number of Visits  48    Date for PT Re-Evaluation  02/16/19   MInireassess complete on 01/24/19 visit #57   Authorization Type  Primary: Medicare; Secondary: BCBS (57 visits per calendar year)    Authorization Time Period  08/15/18 - 10/14/18; NEW: 10/13/18 to 11/24/18; 11/22/18 - 01/03/19; 01/03/19 to 02/16/19; NEW: 01/24/19-03/16/19    Authorization - Visit Number  40   starting 2020   Authorization - Number of Visits  77    PT Start Time  0950    PT Stop Time  1032    PT Time Calculation (min)  42 min    Equipment Utilized During Treatment  Gait belt    Activity Tolerance  Patient tolerated treatment well    Behavior During Therapy  WFL for tasks assessed/performed       Past Medical History:  Diagnosis Date  . Allergic rhinitis   . Arthritis   . B12 deficiency   . Cervicogenic headache 01/28/2016  . Childhood asthma   . Chronic back pain   . Coronary atherosclerosis of native coronary artery    Mild nonobstructive CAD at catheterization January 2015  . Depression   . Essential hypertension   . Falls   . GERD (gastroesophageal reflux disease)   . History of cerebrovascular disease 07/23/2015  . History of pneumonia 02/2011  . Hyperlipidemia   . Kidney stone   . Memory difficulty 07/23/2015  . OSA (obstructive sleep apnea)    CPAP - Dr. [name]  . Prostate cancer ([place])   . PTSD (post-traumatic stress disorder)    [place]  . Rectal bleeding     Past Surgical History:  Procedure Laterality Date  . APPLICATION OF ROBOTIC ASSISTANCE FOR SPINAL PROCEDURE N/A 03/28/2018   Procedure: APPLICATION OF ROBOTIC  ASSISTANCE FOR SPINAL PROCEDURE;  Surgeon: Kristeen Miss, MD;  Location: [place];  Service: Neurosurgery;  Laterality: N/A;  . BACK SURGERY  02/14/12   lumbar OR #7; "today redid L1L2; replaced screws; added bone from hip"  . BILATERAL KNEE ARTHROSCOPY    . COLONOSCOPY  10/15/2008   Dr. [name]: tubular adenoma   . COLONOSCOPY  12/17/2003   OYD:XAJOIN rectal and colon  . COLONOSCOPY N/A 09/05/2014   Procedure: COLONOSCOPY;  Surgeon: Daneil Dolin, MD;  [facility];  Service: Endoscopy;  Laterality: N/A;  7:30-rescheduled 9/17 to [place] notified pt  . CYSTOSCOPY WITH STENT PLACEMENT Right 01/27/2016   Procedure: CYSTOSCOPY WITH STENT PLACEMENT;  Surgeon: [name], MD;  [facility];  Service: Urology;  Laterality: Right;  . CYSTOSCOPY/RETROGRADE/URETEROSCOPY/STONE EXTRACTION WITH BASKET Right 01/27/2016   Procedure: CYSTOSCOPY, RIGHT RETROGRADE, RIGHT URETEROSCOPY, STONE EXTRACTION ;  Surgeon: [name], MD;  Location: [facility];  Service: Urology;  Laterality: Right;  . ESOPHAGOGASTRODUODENOSCOPY  10/15/2008     Dr Rourk:Schatzki's ring status post dilation and disruption via 94 F [address]oney dilator/ otherwise unremarkable esophagus, small hiatal hernia, multiple fundal gland polyps not manipulated, gastritis, negative H.pylori  . ESOPHAGOGASTRODUODENOSCOPY  06/21/02   [facility] sliding hiatal hernia with mild changes of reflux esophagitis limited to gastroesophageal junction.  Noncritical ring at distal esophagus, 3 cm proximal to gastroesophageal junction/Antral gastritis  . [place]   "broke face playing softball"  . FRACTURE SURGERY     "left wrist; broke it; took spur off"  . HOLMIUM LASER APPLICATION Right 7/0/2637   Procedure: HOLMIUM LASER APPLICATION;  Surgeon: [name], MD;  Location[facility];  Service: Urology;  Laterality: Right;  . KNEE ARTHROSCOPY Right 05/18/2018   Procedure: PARTIAL MEDIAL MENISECTOMY AND SURGICAL LAVAGE  AND CHONDROPLASTY;  Surgeon: [name], MD;  Location: [place];  Service: Orthopedics;  Laterality: Right;  . LEFT HEART CATHETERIZATION WITH CORONARY ANGIOGRAM N/A 12/27/2013   Procedure: LEFT HEART CATHETERIZATION WITH CORONARY ANGIOGRAM;  Surgeon: [name], MD;  Location: [facility] C[facility];  Service: Cardiovascular;  Laterality: N/A;  . neck epidural    . POSTERIOR LUMBAR FUSION 4 LEVEL N/A 03/28/2018   Procedure: Thoracic eight -Lumbar two- FIXATION WITH SCREW PLACEMENT, DECOMPRESSION Thoracic ten-Thoracic eleven  FOR OSTEOMYELITIS;  Surgeon: Kristeen Miss, MD;  Location: [place];  Service: Neurosurgery;  Laterality: N/A;  . SHOULDER SURGERY Bilateral   . TEE WITHOUT CARDIOVERSION N/A 03/21/2018   Procedure: TRANSESOPHAGEAL ECHOCARDIOGRAM (TEE) WITH PROPOFOL;  Surgeon: [name], MD;  [facility];  Service: Cardiovascular;  Laterality: N/A;    There were no vitals filed for this visit.  Subjective Assessment - 02/02/19 1050    Subjective  Patient reports he went to [facility] yesterday and saw his spine MD who placed the stimulator in his back. He reports the doctor was unaware he had complications and infection resulting in a SCI. He has imaging done to assess if the stimulator wires had come out of place from his fall, and results revealed they did. They reset the stimulator settings to adjust them for it to work with where the wires are located now and he is going to follow up in 2 weeks. He has an appointment at the [facility] today as well. He reports some new ongoing discomfort around his bladder region and states he has not mentioned it to his doctor. He states it hurts when he puts pressure on that area.    Pertinent History  T10-11 Epidural Abscess, back surgery on 03/28/18, right knee partial medial menisectomy and chondroplasty    Limitations  Standing;Walking;House hold activities;Lifting    Diagnostic tests  CT Lumbar Spine 05/12/18: "Spinal stimulator device, along with  multilevel multisegment fusion hardware appear appropriately placed without adverse features."    Patient Stated Goals  To walk    Currently in Pain?  No/denies        [facility] Adult PT Treatment/Exercise - 02/02/19 0001      Ambulation/Gait   Ambulation/Gait  Yes    Ambulation/Gait Assistance  5: Supervision;4: Min guard    Ambulation/Gait Assistance Details  w/c follow for safety    Ambulation Distance (Feet)  70 Feet    Assistive device  Rolling walker    Gait Pattern  Decreased dorsiflexion - left;Decreased dorsiflexion - right;Trunk flexed;Decreased step length - right;Decreased step length - left;Step-through pattern    Ambulation Surface  Level;Indoor      Knee/Hip Exercises: Stretches   Sports administrator  Both;3 reps;30 seconds    Optometrist assistance for overpressure by PT    Hip Flexor Stretch  Both;3 reps;Limitations   sustained during quad stretching (6 minutes)   Hip Flexor Stretch Limitations  towel under thigh; quad stretch performed during this  Piriformis Stretch  Both;3 reps;30 seconds    Piriformis Stretch Limitations  Manual assistance for overpressure by PT    Other Knee/Hip Stretches  Hip adductor stretch: Bil LE's 2x30 seconds      Knee/Hip Exercises: Seated   Sit to Sand  2 sets;10 reps;without UE support   from mat table (21.5")     Knee/Hip Exercises: Prone   Hip Extension  Strengthening;Both;1 set;10 reps        PT Education - 02/02/19 1041    Education Details  Reviewed importance of HEP participation and encouraged to try exercising at [facility] with membership throughout the week.    Person(s) Educated  Patient    Methods  Explanation    Comprehension  Verbalized understanding       PT Short Term Goals - 01/24/19 1037      PT SHORT TERM GOAL #1   Title  Patient will report understanding and regular compliance with HEP to improve strength and overall functional mobility.     Baseline  01/24/19 = pt is workign on stretches with  assistance from sig other    Time  4    Period  Weeks    Status  Achieved      PT SHORT TERM GOAL #2   Title  Patient will demonstrate improvement of 1/2 MMT strength grade in all muscle groups tested to assist patient with safe transfers, ambulation, and standing.     Baseline  01/24/2019 = Met for all groups with exception of hip abductors bil since evaluation    Time  4    Period  Weeks    Status  Partially Met      PT SHORT TERM GOAL #3   Title  Patient will report or demonstrate ability to perform ambulation for 2 minutes with LRAD with no more than supervision assistance without rest break and reported no more than minimal difficulty.     Baseline  01/24/19 = amb 4:45 straight with RW and 3 standing rest breaks due to shoulders (amb 2:10 before first standing rest break)    Time  4    Period  Weeks    Status  Achieved      PT SHORT TERM GOAL #4   Title  Patient will demonstrate ability to ambulate 60 feet without rest break using LRAD with no more than minimal assistance.    Time  2    Period  Weeks    Status  Achieved        PT Long Term Goals - 01/24/19 1046      PT LONG TERM GOAL #1   Title  Patient will report or demonstrate ability to perform ambulation for 5 minutes with LRAD with no more than supervision assistance without rest break and reported no more than minimal difficulty.     Baseline  01/24/19 = amb 4:45 straight with RW and 3 standing rest breaks due to shoulders (amb 2:10 before first standing rest break)    Time  6    Period  Weeks    Status  On-going      PT LONG TERM GOAL #2   Title  Patient will report ability to stand at a support surface for 10 minutes without a rest break in order to perform household chores with increased ease.     Baseline  01/24/19 = reports he can do this with support surface    Time  6    Period  Weeks  Status  Achieved      PT LONG TERM GOAL #3   Title  Patient will demonstrate ability to ambulate 120 feet without rest break  using LRAD with no more than minimal assistance.    Baseline  01/24/19 = amb 54 ft this date at 2 minutes, and 58 ft at 2:10 seconds before 1st standign rest break, min guard/assist with RW    Time  6    Period  Weeks    Status  On-going      PT LONG TERM GOAL #4   Title  Patient will perform 5x sit to stand from standard chair with good control and single UE assist in 16 seconds or less to demonstrate improved LE strength for functional transfers.    Baseline  01/24/19 =  from standard chair with UE support and RW 19.8 on first attempt; 14.9 on seconds attempt    Time  6    Period  Weeks    Status  Achieved      PT LONG TERM GOAL #5   Title  Patient will report regular participation in exercise program at [facility], at least 1x/week, (he is already a Merchandiser, retail) in aquatic setting or on safe/approriate gym equipment to increase independence with strengthening    Time  4    Period  Weeks    Status  New    Target Date  02/21/19      Additional Long Term Goals   Additional Long Term Goals  Yes      PT LONG TERM GOAL #6   Title  Patient will be participating in exercise program at [facility] 2x/week in addition to therapy sessions to demonstrate increased independence and consistency with regular exercise for increased health/wellness.    Time  7    Period  Weeks    Status  New    Target Date  03/14/19        Plan - 02/02/19 1043    Clinical Impression Statement  Start of session focused on hip/LE stretching and reviewed importance of performing at home as part of HEP to improve posture with gait. Educated on spending time in prone to improve hip flexibility to improve posture and patient states he will try to do so. Performed prone SLR and patient demonstrated greater glut max activation on Lt hip. He was able to perform sit to stand without UE support form mat table and maintained standing each time for ~ 3-5 seconds without UE support. 2/20 reps he was unable to stand all the way up due to  muscle fatigue. He will continue to benefit from skilled PT interventions to address impairments and progress independence with mobility.     Rehab Potential  Fair    Clinical Impairments Affecting Rehab Potential  Positive: Highly motivated; Negative: Comorbidities    PT Frequency  3x / week   3x/week for 3 weeks; 2x/week for 4 weeks in march   PT Duration  Other (comment)   7 weeks   PT Treatment/Interventions  ADLs/Self Care Home Management;Aquatic Therapy;DME Instruction;Gait training;Functional mobility training;Stair training;Therapeutic activities;Therapeutic exercise;Balance training;Neuromuscular re-education;Patient/family education;Orthotic Fit/Training;Wheelchair mobility training;Manual techniques;Passive range of motion;Energy conservation;Dry needling;Splinting;Taping    PT Next Visit Plan  Encourage to participate in exercise at [facility] and to use NuStep machine there. Review importance of strengthening at home and of stretching for improve posture and gait quality. Continue with gait training and with functional and machine exercises for LE. Use machine based strengthening to improve patient's independence with  using equipment at gym.     PT Home Exercise Plan  08/16/18:  seated DF/PF, LAQ, marching, abd with theraband and bridge; 11/13: HS and calf stretching (says he has been performing this at home); 01/24/2019 = prone hip flexor stretch    Consulted and Agree with Plan of Care  Patient       Patient will benefit from skilled therapeutic intervention in order to improve the following deficits and impairments:  Abnormal gait, Decreased balance, Decreased endurance, Decreased mobility, Difficulty walking, Hypomobility, Decreased range of motion, Decreased activity tolerance, Decreased strength, Impaired flexibility  Visit Diagnosis: Other abnormalities of gait and mobility  Other symptoms and signs involving the musculoskeletal system  Muscle weakness (generalized)     Problem  List Patient Active Problem List   Diagnosis Date Noted  . Pseudogout of knee   . Pyogenic arthritis of right knee joint ([place])   . Falls frequently 05/08/2018  . Fall   . Sleep disturbance   . Post-op pain   . Hypokalemia   . Morbid obesity ([place])   . Postoperative pain   . DVT, lower extremity, distal, acute, bilateral ([place])   . Acute blood loss anemia   . Anemia of chronic disease   . Essential hypertension   . Bacteremia   . Myelopathy ([place]) 03/30/2018  . Paraplegia ([place])   . Postlaminectomy syndrome, lumbar region   . Epidural abscess   . Abdominal distension   . Encephalopathy   . Weakness of both lower extremities   . Discitis of thoracic region   . Spinal cord stimulator status   . Staphylococcus aureus sepsis ([place]) 03/20/2018  . Bacteremia due to methicillin susceptible Staphylococcus aureus (MSSA) 03/18/2018  . Obesity 03/18/2018  . Nephrolithiasis 03/16/2018  . AKI (acute kidney injury) ([place]) 03/16/2018  . Cognitive impairment 03/16/2018  . Chronic pain syndrome 03/16/2018  . B12 deficiency 01/28/2016  . Memory difficulty 07/23/2015  . History of cerebrovascular disease 07/23/2015  . FH: colon cancer 08/13/2014  . Hx of adenomatous colonic polyps 08/13/2014  . Chronic back pain 05/30/2012  . CAD, NATIVE VESSEL 07/17/2010  . HYPERCHOLESTEROLEMIA 10/31/2009  . SMOKELESS TOBACCO ABUSE 10/31/2009  . Obstructive sleep apnea 10/31/2009  . Essential hypertension, benign 10/31/2009  . GERD 10/31/2009    Kipp Brood, PT, DPT, [facility] Physical Therapist with [facility]  02/02/2019 10:55 AM    [facility] [address] [place], [place], [address] Phone: [phone]   Fax:  [phone]  Name: Cody Velez MRN: [id number] Date of Birth: 05-20-1946

## 2019-02-05 ENCOUNTER — Ambulatory Visit (HOSPITAL_COMMUNITY): Payer: Medicare Other

## 2019-02-05 DIAGNOSIS — R2689 Other abnormalities of gait and mobility: Secondary | ICD-10-CM | POA: Diagnosis not present

## 2019-02-05 DIAGNOSIS — M6281 Muscle weakness (generalized): Secondary | ICD-10-CM | POA: Diagnosis not present

## 2019-02-05 DIAGNOSIS — R29898 Other symptoms and signs involving the musculoskeletal system: Secondary | ICD-10-CM

## 2019-02-06 ENCOUNTER — Encounter (HOSPITAL_COMMUNITY): Payer: Self-pay

## 2019-02-06 ENCOUNTER — Other Ambulatory Visit: Payer: Self-pay

## 2019-02-06 NOTE — Therapy (Addendum)
[place], [place], [address] Phone: [phone]   Fax:  [phone]  Physical Therapy Aquatic Treatment  Patient Details  Name: Cody Velez MRN: [id number] Date of Birth: [birth year] Referring Provider (PT): Cody Noble, MD   Encounter Date: 02/05/2019  PT End of Session - 02/05/19 1436    Visit Number  39    Number of Visits  57    Date for PT Re-Evaluation  02/16/19   MInireassess complete on 01/24/19 visit #57   Authorization Type  Primary: Medicare; Secondary: BC[employer] (68 visits per calendar year)    Authorization Time Period  08/15/18 - 10/14/18; NEW: 10/13/18 to 11/24/18; 11/22/18 - 01/03/19; 01/03/19 to 02/16/19; NEW: 01/24/19-03/16/19    Authorization - Visit Number  4   starting 2020   Authorization - Number of Visits  53    PT Start Time  9833   arrived at 1345 (chair lift not working for 10 mins)   PT Stop Time  1436    PT Time Calculation (min)  41 min    Equipment Utilized During Treatment  Gait belt    Activity Tolerance  Patient tolerated treatment well    Behavior During Therapy  WFL for tasks assessed/performed       Past Medical History:  Diagnosis Date  . Allergic rhinitis   . Arthritis   . B12 deficiency   . Cervicogenic headache 01/28/2016  . Childhood asthma   . Chronic back pain   . Coronary atherosclerosis of native coronary artery    Mild nonobstructive CAD at catheterization January 2015  . Depression   . Essential hypertension   . Falls   . GERD (gastroesophageal reflux disease)   . History of cerebrovascular disease 07/23/2015  . History of pneumonia 02/2011  . Hyperlipidemia   . Kidney stone   . Memory difficulty 07/23/2015  . OSA (obstructive sleep apnea)    CPAP - Dr. Gwenette Velez  . Prostate cancer ([place])   . PTSD (post-traumatic stress disorder)    [place]  . Rectal bleeding     Past Surgical History:  Procedure Laterality Date  . APPLICATION OF ROBOTIC ASSISTANCE FOR SPINAL  PROCEDURE N/A 03/28/2018   Procedure: APPLICATION OF ROBOTIC ASSISTANCE FOR SPINAL PROCEDURE;  Surgeon: Cody Miss, MD;  Location: [place] [facility];  Service: Neurosurgery;  Laterality: N/A;  . BACK SURGERY  02/14/12   lumbar OR #7; "today redid L1L2; replaced screws; added bone from hip"  . BILATERAL KNEE ARTHROSCOPY    . COLONOSCOPY  10/15/2008   Dr. Gala Velez: tubular adenoma   . COLONOSCOPY  12/17/2003   ASN:[id number] rectal and colon  . COLONOSCOPY N/A 09/05/2014   Procedure: COLONOSCOPY;  Surgeon: Cody Dolin, MD;  [facility];  Service: Endoscopy;  Laterality: N/A;  7:30-rescheduled 9/17 to [place] notified pt  . CYSTOSCOPY WITH STENT PLACEMENT Right 01/27/2016   Procedure: CYSTOSCOPY WITH STENT PLACEMENT;  Surgeon: Cody Gallo, MD;  Location: [facility];  Service: Urology;  Laterality: Right;  . CYSTOSCOPY/RETROGRADE/URETEROSCOPY/STONE EXTRACTION WITH BASKET Right 01/27/2016   Procedure: CYSTOSCOPY, RIGHT RETROGRADE, RIGHT URETEROSCOPY, STONE EXTRACTION ;  Surgeon: Cody Gallo, MD;  Location: [facility];  Service: Urology;  Laterality: Right;  . ESOPHAGOGASTRODUODENOSCOPY  10/15/2008     Dr Cody Velez:Schatzki's ring status post dilation and disruption via 28 F Maloney dilator/ otherwise unremarkable esophagus, small hiatal hernia, multiple fundal gland polyps not manipulated, gastritis, negative H.pylori  . ESOPHAGOGASTRODUODENOSCOPY  06/21/02   [facility]:LPFXT sliding  hiatal hernia with mild changes of reflux esophagitis limited to gastroesophageal junction.  Noncritical ring at distal esophagus, 3 cm proximal to gastroesophageal junction/Antral gastritis  . [place]   "broke face playing softball"  . FRACTURE SURGERY     "left wrist; broke it; took spur off"  . HOLMIUM LASER APPLICATION Right 03/21/3243   Procedure: HOLMIUM LASER APPLICATION;  Surgeon: Cody Gallo, MD;  Location[facility];  Service: Urology;  Laterality: Right;  . KNEE ARTHROSCOPY Right 05/18/2018    Procedure: PARTIAL MEDIAL MENISECTOMY AND SURGICAL LAVAGE AND CHONDROPLASTY;  Surgeon: Cody Bond, MD;  Location: [facility];  Service: Orthopedics;  Laterality: Right;  . LEFT HEART CATHETERIZATION WITH CORONARY ANGIOGRAM N/A 12/27/2013   Procedure: LEFT HEART CATHETERIZATION WITH CORONARY ANGIOGRAM;  Surgeon: Cody M Martinique, MD;  Location: [facility] C[facility];  Service: Cardiovascular;  Laterality: N/A;  . neck epidural    . POSTERIOR LUMBAR FUSION 4 LEVEL N/A 03/28/2018   Procedure: Thoracic eight -Lumbar two- FIXATION WITH SCREW PLACEMENT, DECOMPRESSION Thoracic ten-Thoracic eleven  FOR OSTEOMYELITIS;  Surgeon: Cody Miss, MD;  Location: [facility];  Service: Neurosurgery;  Laterality: N/A;  . SHOULDER SURGERY Bilateral   . TEE WITHOUT CARDIOVERSION N/A 03/21/2018   Procedure: TRANSESOPHAGEAL ECHOCARDIOGRAM (TEE) WITH PROPOFOL;  Surgeon: Cody Sark, MD;  [facility];  Service: Cardiovascular;  Laterality: N/A;    There were no vitals filed for this visit.  Subjective Assessment - 02/05/19 1436    Subjective  Patient arrives for aqutics and reports he is feelign well. He has not been experiencing pain in his back or neck since the stimulartor has been adjusted. He reports he has not tried to come to the [facility] yet on his own.     Pertinent History  T10-11 Epidural Abscess, back surgery on 03/28/18, right knee partial medial menisectomy and chondroplasty    Limitations  Standing;Walking;House hold activities;Lifting    Diagnostic tests  CT Lumbar Spine 05/12/18: "Spinal stimulator device, along with multilevel multisegment fusion hardware appear appropriately placed without adverse features."    Patient Stated Goals  To walk    Currently in Pain?  No/denies        Adult Aquatic Therapy - 02/05/19 0748      Aquatic Therapy Subjective   Subjective  Patient arrives for aqutics and reports he is feelign well. He has not been experiencing pain in his back or neck since the stimulartor has  been adjusted. He reports he has not tried to come to the [facility] yet on his own.       Treatment   Exercises  Bicycle kicks in deep water 42m Bounding/aquajog 2x 12.535mn deep water. 2x 30" verticle flutter kicks. 2x 30" jumping jacks.. Marland Kitchen   Specific Exercises  Hip/Low Back    Hip/Low Back  1x 15 reps, double arm press to latearl hip with each hand (blue barbell). 1x 20 reps squats with no UE support. 1x 20 reps bil LE heel raise. 1x 15 rep single LE heel raise. 3x 30 seconds hamstring stretch (wall walk; with feet).    Balance  5x forward/backward step over 6" hurdles. 5 reps lateral step over 6" hurdles.   attempted warrior 1 2x bil, pt with difficulty balancing       PT Education - 02/05/19 1436    Education Details  [facility]/with significant other to walk in pool and use equipment in gym.  Person(s) Educated  Patient    Methods  Explanation    Comprehension  Verbalized understanding       PT Short Term Goals - 01/24/19 1037      PT SHORT TERM GOAL #1   Title  Patient will report understanding and regular compliance with HEP to improve strength and overall functional mobility.     Baseline  01/24/19 = pt is workign on stretches with assistance from sig other    Time  4    Period  Weeks    Status  Achieved      PT SHORT TERM GOAL #2   Title  Patient will demonstrate improvement of 1/2 MMT strength grade in all muscle groups tested to assist patient with safe transfers, ambulation, and standing.     Baseline  01/24/2019 = Met for all groups with exception of hip abductors bil since evaluation    Time  4    Period  Weeks    Status  Partially Met      PT SHORT TERM GOAL #3   Title  Patient will report or demonstrate ability to perform ambulation for 2 minutes with LRAD with no more than supervision assistance without rest break and reported no more than minimal difficulty.     Baseline  01/24/19 = amb 4:45 straight with RW and 3 standing  rest breaks due to shoulders (amb 2:10 before first standing rest break)    Time  4    Period  Weeks    Status  Achieved      PT SHORT TERM GOAL #4   Title  Patient will demonstrate ability to ambulate 60 feet without rest break using LRAD with no more than minimal assistance.    Time  2    Period  Weeks    Status  Achieved        PT Long Term Goals - 01/24/19 1046      PT LONG TERM GOAL #1   Title  Patient will report or demonstrate ability to perform ambulation for 5 minutes with LRAD with no more than supervision assistance without rest break and reported no more than minimal difficulty.     Baseline  01/24/19 = amb 4:45 straight with RW and 3 standing rest breaks due to shoulders (amb 2:10 before first standing rest break)    Time  6    Period  Weeks    Status  On-going      PT LONG TERM GOAL #2   Title  Patient will report ability to stand at a support surface for 10 minutes without a rest break in order to perform household chores with increased ease.     Baseline  01/24/19 = reports he can do this with support surface    Time  6    Period  Weeks    Status  Achieved      PT LONG TERM GOAL #3   Title  Patient will demonstrate ability to ambulate 120 feet without rest break using LRAD with no more than minimal assistance.    Baseline  01/24/19 = amb 54 ft this date at 2 minutes, and 58 ft at 2:10 seconds before 1st standign rest break, min guard/assist with RW    Time  6    Period  Weeks    Status  On-going      PT LONG TERM GOAL #4   Title  Patient will perform 5x sit to stand from standard chair with good control  and single UE assist in 16 seconds or less to demonstrate improved LE strength for functional transfers.    Baseline  01/24/19 =  from standard chair with UE support and RW 19.8 on first attempt; 14.9 on seconds attempt    Time  6    Period  Weeks    Status  Achieved      PT LONG TERM GOAL #5   Title  Patient will report regular participation in exercise program at  [facility], at least 1x/week, (he is already a Merchandiser, retail) in aquatic setting or on safe/approriate gym equipment to increase independence with strengthening    Time  4    Period  Weeks    Status  New    Target Date  02/21/19      Additional Long Term Goals   Additional Long Term Goals  Yes      PT LONG TERM GOAL #6   Title  Patient will be participating in exercise program at [facility] 2x/week in addition to therapy sessions to demonstrate increased independence and consistency with regular exercise for increased health/wellness.    Time  7    Period  Weeks    Status  New    Target Date  03/14/19         Plan - 02/05/19 1436    Clinical Impression Statement  Continued with aquatic therapy session today and patient performed functional LE strengthening at start of session. He required cues throughout to extend knees and stand upright to improve posture. Deep water exercises were continued today for endurance and cardio exercise. He performed bicycle kicking and initiate bounding/aqua jogging today and required cues to focus on exercise/task. Patient required cues to focus throughout and had difficulty with maintaining balance during yoga poses and hurdle step over requiring external support with lane line. When focused his balance improved. He will continue to benefit from skilled PT interventions to address impairments and progress towards goals and independent exercise program.     Rehab Potential  Fair    Clinical Impairments Affecting Rehab Potential  Positive: Highly motivated; Negative: Comorbidities    PT Frequency  3x / week   3x/week for 3 weeks; 2x/week for 4 weeks in march   PT Duration  Other (comment)   7 weeks   PT Treatment/Interventions  ADLs/Self Care Home Management;Aquatic Therapy;DME Instruction;Gait training;Functional mobility training;Stair training;Therapeutic activities;Therapeutic exercise;Balance training;Neuromuscular re-education;Patient/family education;Orthotic  Fit/Training;Wheelchair mobility training;Manual techniques;Passive range of motion;Energy conservation;Dry needling;Splinting;Taping    PT Next Visit Plan  Continue to encourage to participate in exercise at [facility] and to use NuStep machine there. Review importance of strengthening and stretching at home and of stretching for improve posture and gait quality. Continue with gait training and with functional and machine exercises for LE. Use machine-based strengthening to improve patient's independence with using equipment at gym.    PT Home Exercise Plan  08/16/18:  seated DF/PF, LAQ, marching, abd with theraband and bridge; 11/13: HS and calf stretching (says he has been performing this at home); 01/24/2019 = prone hip flexor stretch    Consulted and Agree with Plan of Care  Patient       Patient will benefit from skilled therapeutic intervention in order to improve the following deficits and impairments:  Abnormal gait, Decreased balance, Decreased endurance, Decreased mobility, Difficulty walking, Hypomobility, Decreased range of motion, Decreased activity tolerance, Decreased strength, Impaired flexibility  Visit Diagnosis: Other abnormalities of gait and mobility  Other symptoms and signs involving the musculoskeletal system  Muscle weakness (generalized)     Problem List Patient Active Problem List   Diagnosis Date Noted  . Pseudogout of knee   . Pyogenic arthritis of right knee joint ([place])   . Falls frequently 05/08/2018  . Fall   . Sleep disturbance   . Post-op pain   . Hypokalemia   . Morbid obesity ([place])   . Postoperative pain   . DVT, lower extremity, distal, acute, bilateral ([place])   . Acute blood loss anemia   . Anemia of chronic disease   . Essential hypertension   . Bacteremia   . Myelopathy ([place]) 03/30/2018  . Paraplegia ([place])   . Postlaminectomy syndrome, lumbar region   . Epidural abscess   . Abdominal distension   . Encephalopathy   . Weakness of both lower  extremities   . Discitis of thoracic region   . Spinal cord stimulator status   . Staphylococcus aureus sepsis ([place]) 03/20/2018  . Bacteremia due to methicillin susceptible Staphylococcus aureus (MSSA) 03/18/2018  . Obesity 03/18/2018  . Nephrolithiasis 03/16/2018  . AKI (acute kidney injury) ([place]) 03/16/2018  . Cognitive impairment 03/16/2018  . Chronic pain syndrome 03/16/2018  . B12 deficiency 01/28/2016  . Memory difficulty 07/23/2015  . History of cerebrovascular disease 07/23/2015  . FH: colon cancer 08/13/2014  . Hx of adenomatous colonic polyps 08/13/2014  . Chronic back pain 05/30/2012  . CAD, NATIVE VESSEL 07/17/2010  . HYPERCHOLESTEROLEMIA 10/31/2009  . SMOKELESS TOBACCO ABUSE 10/31/2009  . Obstructive sleep apnea 10/31/2009  . Essential hypertension, benign 10/31/2009  . GERD 10/31/2009    Kipp Brood, PT, DPT, [facility] Physical Therapist with [facility]  02/06/2019 8:14 AM    [facility] [address] [place], [place], [address] Phone: [phone]   Fax:  [phone]  Name: Cody Velez MRN: [id number] Date of Birth: 1946-10-18

## 2019-02-07 ENCOUNTER — Encounter (HOSPITAL_COMMUNITY): Payer: Self-pay

## 2019-02-07 ENCOUNTER — Ambulatory Visit (HOSPITAL_COMMUNITY): Payer: Medicare Other

## 2019-02-07 DIAGNOSIS — M6281 Muscle weakness (generalized): Secondary | ICD-10-CM | POA: Diagnosis not present

## 2019-02-07 DIAGNOSIS — R2689 Other abnormalities of gait and mobility: Secondary | ICD-10-CM | POA: Diagnosis not present

## 2019-02-07 DIAGNOSIS — R29898 Other symptoms and signs involving the musculoskeletal system: Secondary | ICD-10-CM | POA: Diagnosis not present

## 2019-02-07 NOTE — Therapy (Signed)
[place], [place], [address] Phone: [phone]   Fax:  [phone]  Physical Therapy Treatment  Patient Details  Name: Cody Velez MRN: [id number] Date of Birth: 1946/10/24 Referring Provider (PT): Asencion Noble, MD   Encounter Date: 02/07/2019  PT End of Session - 02/07/19 0950    Visit Number  45    Number of Visits  49    Date for PT Re-Evaluation  02/16/19   MInireassess complete on 01/24/19 visit #57   Authorization Type  Primary: Medicare; Secondary: [employer] (78 visits per calendar year)    Authorization Time Period  08/15/18 - 10/14/18; NEW: 10/13/18 to 11/24/18; 11/22/18 - 01/03/19; 01/03/19 to 02/16/19; NEW: 01/24/19-03/16/19    Authorization - Visit Number  18   starting 2020   Authorization - Number of Visits  2    PT Start Time  0948    PT Stop Time  1028    PT Time Calculation (min)  40 min    Equipment Utilized During Treatment  Gait belt    Activity Tolerance  Patient tolerated treatment well    Behavior During Therapy  WFL for tasks assessed/performed       Past Medical History:  Diagnosis Date  . Allergic rhinitis   . Arthritis   . B12 deficiency   . Cervicogenic headache 01/28/2016  . Childhood asthma   . Chronic back pain   . Coronary atherosclerosis of native coronary artery    Mild nonobstructive CAD at catheterization January 2015  . Depression   . Essential hypertension   . Falls   . GERD (gastroesophageal reflux disease)   . History of cerebrovascular disease 07/23/2015  . History of pneumonia 02/2011  . Hyperlipidemia   . Kidney stone   . Memory difficulty 07/23/2015  . OSA (obstructive sleep apnea)    CPAP - Dr. [name]  . Prostate cancer ([place])   . PTSD (post-traumatic stress disorder)    [place]  . Rectal bleeding     Past Surgical History:  Procedure Laterality Date  . APPLICATION OF ROBOTIC ASSISTANCE FOR SPINAL PROCEDURE N/A 03/28/2018   Procedure: APPLICATION OF ROBOTIC  ASSISTANCE FOR SPINAL PROCEDURE;  Surgeon: Kristeen Miss, MD;  Location: [facility];  Service: Neurosurgery;  Laterality: N/A;  . BACK SURGERY  02/14/12   lumbar OR #7; "today redid L1L2; replaced screws; added bone from hip"  . BILATERAL KNEE ARTHROSCOPY    . COLONOSCOPY  10/15/2008   Dr. [name]: tubular adenoma   . COLONOSCOPY  12/17/2003   YJE:HUDJSH rectal and colon  . COLONOSCOPY N/A 09/05/2014   Procedure: COLONOSCOPY;  Surgeon: Daneil Dolin, MD;  [facility];  Service: Endoscopy;  Laterality: N/A;  7:30-rescheduled 9/17 to Rosa Sanchez notified pt  . CYSTOSCOPY WITH STENT PLACEMENT Right 01/27/2016   Procedure: CYSTOSCOPY WITH STENT PLACEMENT;  Surgeon: [name], MD;  [facility]: [facility];  Service: Urology;  Laterality: Right;  . CYSTOSCOPY/RETROGRADE/URETEROSCOPY/STONE EXTRACTION WITH BASKET Right 01/27/2016   Procedure: CYSTOSCOPY, RIGHT RETROGRADE, RIGHT URETEROSCOPY, STONE EXTRACTION ;  Surgeon: [name], MD;  Location: [facility];  Service: Urology;  Laterality: Right;  . ESOPHAGOGASTRODUODENOSCOPY  10/15/2008     Dr Rourk:Schatzki's ring status post dilation and disruption via 31 F Maloney dilator/ otherwise unremarkable esophagus, small hiatal hernia, multiple fundal gland polyps not manipulated, gastritis, negative H.pylori  . ESOPHAGOGASTRODUODENOSCOPY  06/21/02   FWY:OVZCH sliding hiatal hernia with mild changes of reflux esophagitis limited to gastroesophageal junction.  Noncritical ring at distal esophagus, 3 cm proximal to gastroesophageal junction/Antral gastritis  . [place]   "broke face playing softball"  . FRACTURE SURGERY     "left wrist; broke it; took spur off"  . HOLMIUM LASER APPLICATION Right 08/23/4966   Procedure: HOLMIUM LASER APPLICATION;  Surgeon: [name], MD;  Location[facility];  Service: Urology;  Laterality: Right;  . KNEE ARTHROSCOPY Right 05/18/2018   Procedure: PARTIAL MEDIAL MENISECTOMY AND SURGICAL LAVAGE  AND CHONDROPLASTY;  Surgeon: [name], MD;  Location: [facility];  Service: Orthopedics;  Laterality: Right;  . LEFT HEART CATHETERIZATION WITH CORONARY ANGIOGRAM N/A 12/27/2013   Procedure: LEFT HEART CATHETERIZATION WITH CORONARY ANGIOGRAM;  Surgeon: [name], MD;  Location: [facility];  Service: Cardiovascular;  Laterality: N/A;  . neck epidural    . POSTERIOR LUMBAR FUSION 4 LEVEL N/A 03/28/2018   Procedure: Thoracic eight -Lumbar two- FIXATION WITH SCREW PLACEMENT, DECOMPRESSION Thoracic ten-Thoracic eleven  FOR OSTEOMYELITIS;  Surgeon: Kristeen Miss, MD;  Location: [facility];  Service: Neurosurgery;  Laterality: N/A;  . SHOULDER SURGERY Bilateral   . TEE WITHOUT CARDIOVERSION N/A 03/21/2018   Procedure: TRANSESOPHAGEAL ECHOCARDIOGRAM (TEE) WITH PROPOFOL;  Surgeon: [name], MD;  [facility];  Service: Cardiovascular;  Laterality: N/A;    There were no vitals filed for this visit.  Subjective Assessment - 02/07/19 1144    Subjective  Pt states that he had a good session in the pool on Monday and he feels tired today.    Pertinent History  T10-11 Epidural Abscess, back surgery on 03/28/18, right knee partial medial menisectomy and chondroplasty    Limitations  Standing;Walking;House hold activities;Lifting    Diagnostic tests  CT Lumbar Spine 05/12/18: "Spinal stimulator device, along with multilevel multisegment fusion hardware appear appropriately placed without adverse features."    Patient Stated Goals  To walk    Currently in Pain?  No/denies            [facility] Adult PT Treatment/Exercise - 02/07/19 0001      Ambulation/Gait   Ambulation/Gait  Yes    Ambulation/Gait Assistance  5: Supervision;4: Min guard    Ambulation/Gait Assistance Details  w/c follow for safety    Ambulation Distance (Feet)  94 Feet   5f, 356f 4493f Assistive device  Rolling walker    Gait Pattern  Decreased dorsiflexion - left;Decreased dorsiflexion - right;Trunk flexed;Decreased  step length - right;Decreased step length - left;Step-through pattern      Knee/Hip Exercises: Stretches   Passive Hamstring Stretch  Both;3 reps;30 seconds    Passive Hamstring Stretch Limitations  seated, foot on 12" step, +manual OP from therapist      Knee/Hip Exercises: Standing   Functional Squat Limitations  3x3reps +bil 5# dumbbell for counter weight in front of mat    Other Standing Knee Exercises  attempted sidestepping in front of mat but unable due to fatigue/unsteadiness            PT Education - 02/07/19 1145    Education Details  exercise technique, continue stretching at home    Person(s) Educated  Patient    Methods  Demonstration    Comprehension  Verbalized understanding        PT Short Term Goals - 01/24/19 1037      PT SHORT TERM GOAL #1   Title  Patient will report understanding and regular compliance with HEP to improve strength and overall functional mobility.  Baseline  01/24/19 = pt is workign on stretches with assistance from sig other    Time  4    Period  Weeks    Status  Achieved      PT SHORT TERM GOAL #2   Title  Patient will demonstrate improvement of 1/2 MMT strength grade in all muscle groups tested to assist patient with safe transfers, ambulation, and standing.     Baseline  01/24/2019 = Met for all groups with exception of hip abductors bil since evaluation    Time  4    Period  Weeks    Status  Partially Met      PT SHORT TERM GOAL #3   Title  Patient will report or demonstrate ability to perform ambulation for 2 minutes with LRAD with no more than supervision assistance without rest break and reported no more than minimal difficulty.     Baseline  01/24/19 = amb 4:45 straight with RW and 3 standing rest breaks due to shoulders (amb 2:10 before first standing rest break)    Time  4    Period  Weeks    Status  Achieved      PT SHORT TERM GOAL #4   Title  Patient will demonstrate ability to ambulate 60 feet without rest break using  LRAD with no more than minimal assistance.    Time  2    Period  Weeks    Status  Achieved        PT Long Term Goals - 01/24/19 1046      PT LONG TERM GOAL #1   Title  Patient will report or demonstrate ability to perform ambulation for 5 minutes with LRAD with no more than supervision assistance without rest break and reported no more than minimal difficulty.     Baseline  01/24/19 = amb 4:45 straight with RW and 3 standing rest breaks due to shoulders (amb 2:10 before first standing rest break)    Time  6    Period  Weeks    Status  On-going      PT LONG TERM GOAL #2   Title  Patient will report ability to stand at a support surface for 10 minutes without a rest break in order to perform household chores with increased ease.     Baseline  01/24/19 = reports he can do this with support surface    Time  6    Period  Weeks    Status  Achieved      PT LONG TERM GOAL #3   Title  Patient will demonstrate ability to ambulate 120 feet without rest break using LRAD with no more than minimal assistance.    Baseline  01/24/19 = amb 54 ft this date at 2 minutes, and 58 ft at 2:10 seconds before 1st standign rest break, min guard/assist with RW    Time  6    Period  Weeks    Status  On-going      PT LONG TERM GOAL #4   Title  Patient will perform 5x sit to stand from standard chair with good control and single UE assist in 16 seconds or less to demonstrate improved LE strength for functional transfers.    Baseline  01/24/19 =  from standard chair with UE support and RW 19.8 on first attempt; 14.9 on seconds attempt    Time  6    Period  Weeks    Status  Achieved  PT LONG TERM GOAL #5   Title  Patient will report regular participation in exercise program at [facility], at least 1x/week, (he is already a memebr) in aquatic setting or on safe/approriate gym equipment to increase independence with strengthening    Time  4    Period  Weeks    Status  New    Target Date  02/21/19       Additional Long Term Goals   Additional Long Term Goals  Yes      PT LONG TERM GOAL #6   Title  Patient will be participating in exercise program at [facility] 2x/week in addition to therapy sessions to demonstrate increased independence and consistency with regular exercise for increased health/wellness.    Time  7    Period  Weeks    Status  New    Target Date  03/14/19            Plan - 02/07/19 1031    Clinical Impression Statement  Pt limited this date due to BLE fatigue this date. Furthest ambulation 20f this date with RW and w/c follow. Attempted sidestepping at mat table with BUE assist on therapist but pt unable due to weakness and unsteadiness. Able to perform mini squats at mat table with bil 5# dumbbells in each UE for counterweight in sets of 3s. Continue as planned, progressing as able.     Rehab Potential  Fair    Clinical Impairments Affecting Rehab Potential  Positive: Highly motivated; Negative: Comorbidities    PT Frequency  3x / week   3x/week for 3 weeks; 2x/week for 4 weeks in march   PT Duration  Other (comment)   7 weeks   PT Treatment/Interventions  ADLs/Self Care Home Management;Aquatic Therapy;DME Instruction;Gait training;Functional mobility training;Stair training;Therapeutic activities;Therapeutic exercise;Balance training;Neuromuscular re-education;Patient/family education;Orthotic Fit/Training;Wheelchair mobility training;Manual techniques;Passive range of motion;Energy conservation;Dry needling;Splinting;Taping    PT Next Visit Plan  assess if pt is as fatigued as last session; [facility]. Review importance of strengthening and stretching at home and of stretching for improve posture and gait quality. Continue with gait training and with functional and machine exercises for LE. Use machine-based strengthening to improve patient's independence with using equipment at gym.    PT Home  Exercise Plan  08/16/18:  seated DF/PF, LAQ, marching, abd with theraband and bridge; 11/13: HS and calf stretching (says he has been performing this at home); 01/24/2019 = prone hip flexor stretch    Consulted and Agree with Plan of Care  Patient       Patient will benefit from skilled therapeutic intervention in order to improve the following deficits and impairments:  Abnormal gait, Decreased balance, Decreased endurance, Decreased mobility, Difficulty walking, Hypomobility, Decreased range of motion, Decreased activity tolerance, Decreased strength, Impaired flexibility  Visit Diagnosis: Other abnormalities of gait and mobility  Other symptoms and signs involving the musculoskeletal system  Muscle weakness (generalized)     Problem List Patient Active Problem List   Diagnosis Date Noted  . Pseudogout of knee   . Pyogenic arthritis of right knee joint ([place]   . Falls frequently 05/08/2018  . Fall   . Sleep disturbance   . Post-op pain   . Hypokalemia   . Morbid obesity ([place]   . Postoperative pain   . DVT, lower extremity, distal, acute, bilateral ([place]   . Acute blood loss anemia   . Anemia of chronic disease   .  Essential hypertension   . Bacteremia   . Myelopathy ([place]) 03/30/2018  . Paraplegia ([place])   . Postlaminectomy syndrome, lumbar region   . Epidural abscess   . Abdominal distension   . Encephalopathy   . Weakness of both lower extremities   . Discitis of thoracic region   . Spinal cord stimulator status   . Staphylococcus aureus sepsis ([place]) 03/20/2018  . Bacteremia due to methicillin susceptible Staphylococcus aureus (MSSA) 03/18/2018  . Obesity 03/18/2018  . Nephrolithiasis 03/16/2018  . AKI (acute kidney injury) ([place]) 03/16/2018  . Cognitive impairment 03/16/2018  . Chronic pain syndrome 03/16/2018  . B12 deficiency 01/28/2016  . Memory difficulty 07/23/2015  . History of cerebrovascular disease 07/23/2015  . FH: colon cancer 08/13/2014  . Hx of  adenomatous colonic polyps 08/13/2014  . Chronic back pain 05/30/2012  . CAD, NATIVE VESSEL 07/17/2010  . HYPERCHOLESTEROLEMIA 10/31/2009  . SMOKELESS TOBACCO ABUSE 10/31/2009  . Obstructive sleep apnea 10/31/2009  . Essential hypertension, benign 10/31/2009  . GERD 10/31/2009        Geraldine Solar PT, DPT  [facility] [address] [place], [place], [address] Phone: [phone]   Fax:  [phone]  Name: KELLON CHALK MRN: [id number] Date of Birth: 07/22/1946

## 2019-02-07 NOTE — Progress Notes (Signed)
Cardiology Office Note  Date: 02/08/2019   ID: Cody Velez, DOB 1946-11-13, MRN [id number]  PCP: Asencion Noble, MD  Primary Cardiologist: Rozann Lesches, MD   Chief Complaint  Patient presents with  . Cardiac follow-up    History of Present Illness: Cody Velez is a 73 y.o. male last seen in August 2019.  He is here for a routine follow-up visit.  From a cardiac perspective he does not report any angina symptoms at this time.  He is still working with PT, also doing water strengthening classes.  I reviewed his medications which are outlined below.  Cardiac regimen includes aspirin, Norvasc, Cozaar, and Pravachol.  He is still following with Dr. Willey Blade.  I personally reviewed his ECG today which shows a sinus rhythm with PACs and IVCD.  Past Medical History:  Diagnosis Date  . Allergic rhinitis   . Arthritis   . B12 deficiency   . Cervicogenic headache 01/28/2016  . Childhood asthma   . Chronic back pain   . Coronary atherosclerosis of native coronary artery    Mild nonobstructive CAD at catheterization January 2015  . Depression   . Essential hypertension   . Falls   . GERD (gastroesophageal reflux disease)   . History of cerebrovascular disease 07/23/2015  . History of pneumonia 02/2011  . Hyperlipidemia   . Kidney stone   . Memory difficulty 07/23/2015  . OSA (obstructive sleep apnea)    CPAP - Dr. [name]  . Prostate cancer ([place])   . PTSD (post-traumatic stress disorder)    [place]  . Rectal bleeding     Past Surgical History:  Procedure Laterality Date  . APPLICATION OF ROBOTIC ASSISTANCE FOR SPINAL PROCEDURE N/A 03/28/2018   Procedure: APPLICATION OF ROBOTIC ASSISTANCE FOR SPINAL PROCEDURE;  Surgeon: Kristeen Miss, MD;  Location: [facility];  Service: Neurosurgery;  Laterality: N/A;  . BACK SURGERY  02/14/12   lumbar OR #7; "today redid L1L2; replaced screws; added bone from hip"  . BILATERAL KNEE ARTHROSCOPY    . COLONOSCOPY  10/15/2008   Dr. [name]: tubular  adenoma   . COLONOSCOPY  12/17/2003   TWS:FKCLEX rectal and colon  . COLONOSCOPY N/A 09/05/2014   Procedure: COLONOSCOPY;  Surgeon: Daneil Dolin, MD;  [facility];  Service: Endoscopy;  Laterality: N/A;  7:30-rescheduled 9/17 to [place] notified pt  . CYSTOSCOPY WITH STENT PLACEMENT Right 01/27/2016   Procedure: CYSTOSCOPY WITH STENT PLACEMENT;  Surgeon: [name], MD;  Location: [facility];  Service: Urology;  Laterality: Right;  . CYSTOSCOPY/RETROGRADE/URETEROSCOPY/STONE EXTRACTION WITH BASKET Right 01/27/2016   Procedure: CYSTOSCOPY, RIGHT RETROGRADE, RIGHT URETEROSCOPY, STONE EXTRACTION ;  Surgeon: [name], MD;  Location: [facility];  Service: Urology;  Laterality: Right;  . ESOPHAGOGASTRODUODENOSCOPY  10/15/2008     Dr Rourk:Schatzki's ring status post dilation and disruption via 65 F [place] dilator/ otherwise unremarkable esophagus, small hiatal hernia, multiple fundal gland polyps not manipulated, gastritis, negative H.pylori  . ESOPHAGOGASTRODUODENOSCOPY  06/21/02   NTZ:GYFVC sliding hiatal hernia with mild changes of reflux esophagitis limited to gastroesophageal junction.  Noncritical ring at distal esophagus, 3 cm proximal to gastroesophageal junction/Antral gastritis  . [place]   "broke face playing softball"  . FRACTURE SURGERY     "left wrist; broke it; took spur off"  . HOLMIUM LASER APPLICATION Right 08/23/4966   Procedure: HOLMIUM LASER APPLICATION;  Surgeon: [name], MD;  Location: [facility];  Service: Urology;  Laterality: Right;  .  KNEE ARTHROSCOPY Right 05/18/2018   Procedure: PARTIAL MEDIAL MENISECTOMY AND SURGICAL LAVAGE AND CHONDROPLASTY;  Surgeon: [name], MD;  Location: [facility];  Service: Orthopedics;  Laterality: Right;  . LEFT HEART CATHETERIZATION WITH CORONARY ANGIOGRAM N/A 12/27/2013   Procedure: LEFT HEART CATHETERIZATION WITH CORONARY ANGIOGRAM;  Surgeon: [name], MD;  Location: [facility] C[facility];   Service: Cardiovascular;  Laterality: N/A;  . neck epidural    . POSTERIOR LUMBAR FUSION 4 LEVEL N/A 03/28/2018   Procedure: Thoracic eight -Lumbar two- FIXATION WITH SCREW PLACEMENT, DECOMPRESSION Thoracic ten-Thoracic eleven  FOR OSTEOMYELITIS;  Surgeon: Kristeen Miss, MD;  Location: [facility];  Service: Neurosurgery;  Laterality: N/A;  . SHOULDER SURGERY Bilateral   . TEE WITHOUT CARDIOVERSION N/A 03/21/2018   Procedure: TRANSESOPHAGEAL ECHOCARDIOGRAM (TEE) WITH PROPOFOL;  Surgeon: [name], MD;  [facility];  Service: Cardiovascular;  Laterality: N/A;    Current Outpatient Medications  Medication Sig Dispense Refill  . amLODipine (NORVASC) 10 MG tablet Take 1 tablet (10 mg total) by mouth daily. 30 tablet 0  . aspirin 81 MG tablet Take 1 tablet (81 mg total) by mouth daily. 30 tablet 0  . baclofen (LIORESAL) 10 MG tablet TAKE ONE TABLET IN THE MORNING AND MIDDAY, THEN TWO TABLETS AT NIGHT 360 each 3  . Baclofen 5 MG TABS Take 5 mg by mouth 3 (three) times daily. 30 tablet 0  . cephALEXin (KEFLEX) 500 MG capsule TAKE ONE CAPSULE (500MG  TOTAL) BY MOUTH THREE TIMES DAILY 90 capsule 2  . colchicine 0.6 MG tablet Take 1 tablet (0.6 mg total) by mouth daily. 30 tablet 0  . cycloSPORINE (RESTASIS) 0.05 % ophthalmic emulsion 1 DROP INTO BOTH EYES TWICE A DAY FOR DRY EYES 0.4 mL 0  . Dalteparin Sodium (FRAGMIN) 14481 UNT/0.72ML SOLN Inject 0.72 mLs into the skin.    . diazepam (VALIUM) 5 MG tablet Take 1 tablet (5 mg total) by mouth every 6 (six) hours as needed for muscle spasms. 30 tablet 0  . docusate sodium (COLACE) 100 MG capsule Take 2 capsules (200 mg total) by mouth 2 (two) times daily. 10 capsule 0  . donepezil (ARICEPT) 10 MG tablet Take 1 tablet (10 mg total) by mouth at bedtime. 30 tablet 0  . finasteride (PROSCAR) 5 MG tablet Take 1 tablet (5 mg total) by mouth daily. Reported on 05/04/2016 30 tablet 0  . fluticasone (FLONASE) 50 MCG/ACT nasal spray Place 1 spray into both  nostrils daily as needed for allergies. 16 g 2  . gabapentin (NEURONTIN) 400 MG capsule Take 1 capsule (400 mg total) by mouth 3 (three) times daily. 90 capsule 0  . latanoprost (XALATAN) 0.005 % ophthalmic solution Place 1 drop into both eyes at bedtime. 2.5 mL 0  . losartan (COZAAR) 100 MG tablet Take 100 mg by mouth daily.    . Melatonin 3 MG TABS Take 0.5 tablets (1.5 mg total) by mouth at bedtime. 30 tablet 0  . memantine (NAMENDA) 10 MG tablet Take 1 tablet (10 mg total) by mouth 2 (two) times daily. 60 tablet 0  . omeprazole (PRILOSEC) 20 MG capsule Take 2 capsules (40 mg total) by mouth daily. 30 capsule 0  . Oxycodone HCl 10 MG TABS Take 1 tablet (10 mg total) by mouth every 4 (four) hours as needed (moderate to severe pain). 15 tablet 0  . polyethylene glycol (MIRALAX / GLYCOLAX) packet Take 17 g by mouth daily. 14 each 0  . potassium chloride SA (K-DUR,KLOR-CON)  20 MEQ tablet Take 1 tablet (20 mEq total) by mouth daily. 30 tablet 0  . pravastatin (PRAVACHOL) 10 MG tablet Take 1 tablet (10 mg total) by mouth every evening. 30 tablet 0  . traZODone (DESYREL) 100 MG tablet Take 1 tablet (100 mg total) by mouth at bedtime. 30 tablet 0  . venlafaxine XR (EFFEXOR-XR) 150 MG 24 hr capsule Take 1 capsule (150 mg total) by mouth 2 (two) times daily. 60 capsule 0  . vitamin B-12 (CYANOCOBALAMIN) 1000 MCG tablet Take 1 tablet (1,000 mcg total) by mouth daily. 30 tablet 0   No current facility-administered medications for this visit.    Allergies:  Patient has no known allergies.   Social History: The patient  reports that he quit smoking about 60 years ago. His smoking use included cigarettes. He has quit using smokeless tobacco.  His smokeless tobacco use included chew. He reports current alcohol use. He reports that he does not use drugs.   ROS:  Please see the history of present illness. Otherwise, complete review of systems is positive for chronic back pain.  All other systems are reviewed  and negative.   Physical Exam: VS:  BP 138/80   Pulse 76   Ht 6\' 3"  (1.905 m)   Wt (!) 307 lb (139.3 kg)   SpO2 98%   BMI 38.37 kg/m , BMI Body mass index is 38.37 kg/m.  Wt Readings from Last 3 Encounters:  02/08/19 (!) 307 lb (139.3 kg)  08/07/18 285 lb (129.3 kg)  07/18/18 277 lb (125.6 kg)    General: Obese male in wheelchair, appears comfortable at rest. HEENT: Conjunctiva and lids normal, oropharynx clear. Neck: Supple, no elevated JVP or carotid bruits, no thyromegaly. Lungs: Clear to auscultation, nonlabored breathing at rest. Cardiac: Regular rate and rhythm with occasional ectopic beat, no S3 or significant systolic murmur. Abdomen: Soft, nontender, bowel sounds present. Extremities: Trace ankle edema, distal pulses 2+. Skin: Warm and dry. Musculoskeletal: No kyphosis. Neuropsychiatric: Alert and oriented x3, affect grossly appropriate.  ECG: I personally reviewed the tracing from 03/15/2018 which showed sinus tachycardia with left anterior fascicular block and PAC.  Recent Labwork: 03/17/2018: B Natriuretic Peptide 61.0 05/09/2018: TSH 0.867 05/11/2018: Magnesium 2.0 05/20/2018: ALT 10; AST 17 01/18/2019: BUN 10; Creat 1.10; Hemoglobin 15.1; Platelets 350; Potassium 3.8; Sodium 141     Component Value Date/Time   CHOL 175 05/30/2012 1913   TRIG 194 (H) 05/30/2012 1913   HDL 28 (L) 05/30/2012 1913   CHOLHDL 6.3 05/30/2012 1913   VLDL 39 05/30/2012 1913   LDLCALC 108 (H) 05/30/2012 1913    Other Studies Reviewed Today:  TEE 03/21/2018: Study Conclusions  - Procedure narrative: Transesophageal echocardiography. Topical anesthesia was obtained using viscous lidocaine. A transesophageal probe was inserted by the attending cardiologist. Image quality was poor. This was a technically difficult study. - Left ventricle: Systolic function was normal. The estimated ejection fraction was in the range of 55% to 60%. - Descending aorta: The descending aorta had  moderate diffuse disease. - Mitral valve: There was trivial regurgitation. - Left atrium: No evidence of thrombus in the atrial cavity or appendage. - Tricuspid valve: There was trivial regurgitation. - Pulmonic valve: There was trivial regurgitation.  Impressions:  - Images were overall limited. Valves best visualized with nonstandard views. There were no obvious valvular vegetations.  Assessment and Plan:  1.  Mild nonobstructive CAD by prior work-up with no active angina symptoms at this time.  ECG reviewed.  Continue aspirin  and statin.  2.  Essential hypertension, blood pressure control is adequate today.  Keep follow-up with Dr. Willey Blade.  No changes made to current regimen.  3.  Mixed hyperlipidemia on Pravachol.  Keep follow-up with Dr. Willey Blade.  Current medicines were reviewed with the patient today.   Orders Placed This Encounter  Procedures  . EKG 12-Lead    Disposition: Follow-up in 6 months.  Signed, [name], MD, [facility] 02/08/2019 1:20 PM    [address] [facility] [address], [place], [place] [address] Phone: [phone]; Fax: [phone]

## 2019-02-08 ENCOUNTER — Encounter: Payer: Self-pay | Admitting: Cardiology

## 2019-02-08 ENCOUNTER — Ambulatory Visit (INDEPENDENT_AMBULATORY_CARE_PROVIDER_SITE_OTHER): Payer: Medicare Other | Admitting: Cardiology

## 2019-02-08 VITALS — BP 138/80 | HR 76 | Ht 75.0 in | Wt 307.0 lb

## 2019-02-08 DIAGNOSIS — I10 Essential (primary) hypertension: Secondary | ICD-10-CM

## 2019-02-08 DIAGNOSIS — I2510 Atherosclerotic heart disease of native coronary artery without angina pectoris: Secondary | ICD-10-CM

## 2019-02-08 DIAGNOSIS — E782 Mixed hyperlipidemia: Secondary | ICD-10-CM

## 2019-02-08 NOTE — Patient Instructions (Addendum)

## 2019-02-09 ENCOUNTER — Ambulatory Visit (HOSPITAL_COMMUNITY): Payer: Medicare Other

## 2019-02-09 ENCOUNTER — Telehealth (HOSPITAL_COMMUNITY): Payer: Self-pay | Admitting: Internal Medicine

## 2019-02-09 NOTE — Telephone Encounter (Signed)
02/09/19  Pt called to cx said he couldn't come... his ramp is frozen over and he would bust his backside if he tried to go down it

## 2019-02-12 ENCOUNTER — Ambulatory Visit (HOSPITAL_COMMUNITY): Payer: Medicare Other

## 2019-02-12 ENCOUNTER — Encounter (HOSPITAL_COMMUNITY): Payer: Self-pay

## 2019-02-12 ENCOUNTER — Other Ambulatory Visit: Payer: Self-pay

## 2019-02-12 DIAGNOSIS — R2689 Other abnormalities of gait and mobility: Secondary | ICD-10-CM | POA: Diagnosis not present

## 2019-02-12 DIAGNOSIS — M6281 Muscle weakness (generalized): Secondary | ICD-10-CM | POA: Diagnosis not present

## 2019-02-12 DIAGNOSIS — R29898 Other symptoms and signs involving the musculoskeletal system: Secondary | ICD-10-CM

## 2019-02-12 NOTE — Therapy (Signed)
[place], [place], [address] Phone: [phone]   Fax:  [phone]  Physical Therapy Aquatic Treatment  Patient Details  Name: Cody Velez MRN: [id number] Date of Birth: [birth year] Referring Provider (PT): Asencion Noble, MD   Encounter Date: 02/12/2019  PT End of Session - 02/12/19 1711    Visit Number  51    Number of Visits  84    Date for PT Re-Evaluation  02/16/19   MInireassess complete on 01/24/19 visit #57   Authorization Type  Primary: Medicare; Secondary: BCBS (56 visits per calendar year)    Authorization Time Period  08/15/18 - 10/14/18; NEW: 10/13/18 to 11/24/18; 11/22/18 - 01/03/19; 01/03/19 to 02/16/19; NEW: 01/24/19-03/16/19    Authorization - Visit Number  86   starting 2020   Authorization - Number of Visits  13    PT Start Time  6333    PT Stop Time  1436    PT Time Calculation (min)  42 min    Equipment Utilized During Treatment  Gait belt    Activity Tolerance  Patient tolerated treatment well    Behavior During Therapy  WFL for tasks assessed/performed       Past Medical History:  Diagnosis Date  . Allergic rhinitis   . Arthritis   . B12 deficiency   . Cervicogenic headache 01/28/2016  . Childhood asthma   . Chronic back pain   . Coronary atherosclerosis of native coronary artery    Mild nonobstructive CAD at catheterization January 2015  . Depression   . Essential hypertension   . Falls   . GERD (gastroesophageal reflux disease)   . History of cerebrovascular disease 07/23/2015  . History of pneumonia 02/2011  . Hyperlipidemia   . Kidney stone   . Memory difficulty 07/23/2015  . OSA (obstructive sleep apnea)    CPAP - Dr. [name]  . Prostate cancer ([place])   . PTSD (post-traumatic stress disorder)    [place]  . Rectal bleeding     Past Surgical History:  Procedure Laterality Date  . APPLICATION OF ROBOTIC ASSISTANCE FOR SPINAL PROCEDURE N/A 03/28/2018   Procedure: APPLICATION OF ROBOTIC  ASSISTANCE FOR SPINAL PROCEDURE;  Surgeon: Kristeen Miss, MD;  Location: [facility];  Service: Neurosurgery;  Laterality: N/A;  . BACK SURGERY  02/14/12   lumbar OR #7; "today redid L1L2; replaced screws; added bone from hip"  . BILATERAL KNEE ARTHROSCOPY    . COLONOSCOPY  10/15/2008   Dr. [name]: tubular adenoma   . COLONOSCOPY  12/17/2003   LKT:GYBWLS rectal and colon  . COLONOSCOPY N/A 09/05/2014   Procedure: COLONOSCOPY;  Surgeon: Daneil Dolin, MD;  [facility];  Service: Endoscopy;  Laterality: N/A;  7:30-rescheduled 9/17 to [place] notified pt  . CYSTOSCOPY WITH STENT PLACEMENT Right 01/27/2016   Procedure: CYSTOSCOPY WITH STENT PLACEMENT;  Surgeon: [name], MD;  Location: [facility];  Service: Urology;  Laterality: Right;  . CYSTOSCOPY/RETROGRADE/URETEROSCOPY/STONE EXTRACTION WITH BASKET Right 01/27/2016   Procedure: CYSTOSCOPY, RIGHT RETROGRADE, RIGHT URETEROSCOPY, STONE EXTRACTION ;  Surgeon: [name], MD;  Location: [facility];  Service: Urology;  Laterality: Right;  . ESOPHAGOGASTRODUODENOSCOPY  10/15/2008     Dr Rourk:Schatzki's ring status post dilation and disruption via 60 F Maloney dilator/ otherwise unremarkable esophagus, small hiatal hernia, multiple fundal gland polyps not manipulated, gastritis, negative H.pylori  . ESOPHAGOGASTRODUODENOSCOPY  06/21/02   LHT:DSKAJ sliding hiatal hernia with mild changes of reflux esophagitis limited to gastroesophageal  junction.  Noncritical ring at distal esophagus, 3 cm proximal to gastroesophageal junction/Antral gastritis  . [place]   "broke face playing softball"  . FRACTURE SURGERY     "left wrist; broke it; took spur off"  . HOLMIUM LASER APPLICATION Right 08/21/5055   Procedure: HOLMIUM LASER APPLICATION;  Surgeon: [name], MD;  Location: [facility];  Service: Urology;  Laterality: Right;  . KNEE ARTHROSCOPY Right 05/18/2018   Procedure: PARTIAL MEDIAL MENISECTOMY AND SURGICAL LAVAGE  AND CHONDROPLASTY;  Surgeon: [name], MD;  Location: [facility];  Service: Orthopedics;  Laterality: Right;  . LEFT HEART CATHETERIZATION WITH CORONARY ANGIOGRAM N/A 12/27/2013   Procedure: LEFT HEART CATHETERIZATION WITH CORONARY ANGIOGRAM;  Surgeon: [name], MD;  Location: [facility] CATH [facility];  Service: Cardiovascular;  Laterality: N/A;  . neck epidural    . POSTERIOR LUMBAR FUSION 4 LEVEL N/A 03/28/2018   Procedure: Thoracic eight -Lumbar two- FIXATION WITH SCREW PLACEMENT, DECOMPRESSION Thoracic ten-Thoracic eleven  FOR OSTEOMYELITIS;  Surgeon: Kristeen Miss, MD;  Location: [facility];  Service: Neurosurgery;  Laterality: N/A;  . SHOULDER SURGERY Bilateral   . TEE WITHOUT CARDIOVERSION N/A 03/21/2018   Procedure: TRANSESOPHAGEAL ECHOCARDIOGRAM (TEE) WITH PROPOFOL;  Surgeon: [name], MD;  [facility];  Service: Cardiovascular;  Laterality: N/A;    There were no vitals filed for this visit.  Subjective Assessment - 02/12/19 1704    Subjective  Patient reports he had severe mid thoracic back pain this past Saturday but that he took some pain medicaiton and it improved. He reportss he does not have any pain currently and it subsided by Sunday. Patient states he saw his cardiologist last week and thye weighed him, he states he has gained weigth back and is now over 300 lbs which he is dissapointed about.    Pertinent History  T10-11 Epidural Abscess, back surgery on 03/28/18, right knee partial medial menisectomy and chondroplasty    Limitations  Standing;Walking;House hold activities;Lifting    Diagnostic tests  CT Lumbar Spine 05/12/18: "Spinal stimulator device, along with multilevel multisegment fusion hardware appear appropriately placed without adverse features."    Patient Stated Goals  To walk    Currently in Pain?  No/denies      Transfers:  Squat pivot transfer performed 2xwith set up assistance and Mod I levelfrom manual wheelchair to pool chair lift. Patient utilized  pool chair lift to get in/out of pool for therapy session  Adult Aquatic Therapy - 02/12/19 1706      Aquatic Therapy Subjective   Subjective  Patient reports he had severe mid thoracic back pain this past Saturday but that he took some pain medicaiton and it improved. He reportss he does not have any pain currently and it subsided by Sunday. Patient states he saw his cardiologist last week and thye weighed him, he states he has gained weigth back and is now over 300 lbs which he is dissapointed about.      Treatment   Exercises  1x 20 reps heel raises bil LE. 1x 20 reps squats at wall, bil LE. 1x 15 reps bil LE hip abduction leg kick, 3 sec holds. 1x 15 reps bil LE hip extension leg kick, 3 sec holds. 6x 15 yards platform/sled push. 1x 10 reps bil LE step ups on platform into standing for 5 seconds.     Specific Exercises  Hip/Low Back    Hip/Low Back  2x 30 seconds bil LE hamstring stretch on platorm.  PT Education - 02/12/19 1711    Education Details  Educated on exercise throughout and continued to encourage to participate in independent exercising at [facility].    Person(s) Educated  Patient    Methods  Explanation    Comprehension  Verbalized understanding       PT Short Term Goals - 01/24/19 1037      PT SHORT TERM GOAL #1   Title  Patient will report understanding and regular compliance with HEP to improve strength and overall functional mobility.     Baseline  01/24/19 = pt is workign on stretches with assistance from sig other    Time  4    Period  Weeks    Status  Achieved      PT SHORT TERM GOAL #2   Title  Patient will demonstrate improvement of 1/2 MMT strength grade in all muscle groups tested to assist patient with safe transfers, ambulation, and standing.     Baseline  01/24/2019 = Met for all groups with exception of hip abductors bil since evaluation    Time  4    Period  Weeks    Status  Partially Met      PT SHORT TERM GOAL #3   Title  Patient will report or  demonstrate ability to perform ambulation for 2 minutes with LRAD with no more than supervision assistance without rest break and reported no more than minimal difficulty.     Baseline  01/24/19 = amb 4:45 straight with RW and 3 standing rest breaks due to shoulders (amb 2:10 before first standing rest break)    Time  4    Period  Weeks    Status  Achieved      PT SHORT TERM GOAL #4   Title  Patient will demonstrate ability to ambulate 60 feet without rest break using LRAD with no more than minimal assistance.    Time  2    Period  Weeks    Status  Achieved        PT Long Term Goals - 01/24/19 1046      PT LONG TERM GOAL #1   Title  Patient will report or demonstrate ability to perform ambulation for 5 minutes with LRAD with no more than supervision assistance without rest break and reported no more than minimal difficulty.     Baseline  01/24/19 = amb 4:45 straight with RW and 3 standing rest breaks due to shoulders (amb 2:10 before first standing rest break)    Time  6    Period  Weeks    Status  On-going      PT LONG TERM GOAL #2   Title  Patient will report ability to stand at a support surface for 10 minutes without a rest break in order to perform household chores with increased ease.     Baseline  01/24/19 = reports he can do this with support surface    Time  6    Period  Weeks    Status  Achieved      PT LONG TERM GOAL #3   Title  Patient will demonstrate ability to ambulate 120 feet without rest break using LRAD with no more than minimal assistance.    Baseline  01/24/19 = amb 54 ft this date at 2 minutes, and 58 ft at 2:10 seconds before 1st standign rest break, min guard/assist with RW    Time  6    Period  Weeks    Status  On-going      PT LONG TERM GOAL #4   Title  Patient will perform 5x sit to stand from standard chair with good control and single UE assist in 16 seconds or less to demonstrate improved LE strength for functional transfers.    Baseline  01/24/19 =  from  standard chair with UE support and RW 19.8 on first attempt; 14.9 on seconds attempt    Time  6    Period  Weeks    Status  Achieved      PT LONG TERM GOAL #5   Title  Patient will report regular participation in exercise program at [facility], at least 1x/week, (he is already a Merchandiser, retail) in aquatic setting or on safe/approriate gym equipment to increase independence with strengthening    Time  4    Period  Weeks    Status  New    Target Date  02/21/19      Additional Long Term Goals   Additional Long Term Goals  Yes      PT LONG TERM GOAL #6   Title  Patient will be participating in exercise program at [facility] 2x/week in addition to therapy sessions to demonstrate increased independence and consistency with regular exercise for increased health/wellness.    Time  7    Period  Weeks    Status  New    Target Date  03/14/19        Plan - 02/12/19 1712    Clinical Impression Statement  Aquatic therapy session focused on LE strengthening and functional endurance/strength exercises. Patient continued with LE squats and hip strengthening and initiated sled/platform pushes today for endurance. He had greater difficulty beginning in deep water as resistance was greater and was able to increase speed in waist deep water. He performed step ups today on platform and required bil UE support this session to maintain balance. He will continue to benefit from skilled PT interventions to address impairments and progress towards goals and independent exercise program.    Rehab Potential  Fair    Clinical Impairments Affecting Rehab Potential  Positive: Highly motivated; Negative: Comorbidities    PT Frequency  3x / week   3x/week for 3 weeks; 2x/week for 4 weeks in march   PT Duration  Other (comment)   7 weeks   PT Treatment/Interventions  ADLs/Self Care Home Management;Aquatic Therapy;DME Instruction;Gait training;Functional mobility training;Stair training;Therapeutic activities;Therapeutic  exercise;Balance training;Neuromuscular re-education;Patient/family education;Orthotic Fit/Training;Wheelchair mobility training;Manual techniques;Passive range of motion;Energy conservation;Dry needling;Splinting;Taping    PT Next Visit Plan  Continue to follow up on fatigue with patient. Continue to encourage to participate in exercise at [facility] and to use NuStep machine there. Review importance of strengthening and stretching at home and of stretching for improve posture and gait quality. Continue with gait training and with functional and machine exercises for LE. Use machine-based strengthening to improve patient's independence with using equipment at gym.    PT Home Exercise Plan  08/16/18:  seated DF/PF, LAQ, marching, abd with theraband and bridge; 11/13: HS and calf stretching (says he has been performing this at home); 01/24/2019 = prone hip flexor stretch    Consulted and Agree with Plan of Care  Patient       Patient will benefit from skilled therapeutic intervention in order to improve the following deficits and impairments:  Abnormal gait, Decreased balance, Decreased endurance, Decreased mobility, Difficulty walking, Hypomobility, Decreased range of motion, Decreased activity tolerance, Decreased strength, Impaired flexibility  Visit Diagnosis: Other abnormalities  of gait and mobility  Other symptoms and signs involving the musculoskeletal system  Muscle weakness (generalized)     Problem List Patient Active Problem List   Diagnosis Date Noted  . Pseudogout of knee   . Pyogenic arthritis of right knee joint ([place])   . Falls frequently 05/08/2018  . Fall   . Sleep disturbance   . Post-op pain   . Hypokalemia   . Morbid obesity ([place])   . Postoperative pain   . DVT, lower extremity, distal, acute, bilateral ([place])   . Acute blood loss anemia   . Anemia of chronic disease   . Essential hypertension   . Bacteremia   . Myelopathy ([place]) 03/30/2018  . Paraplegia ([place])   .  Postlaminectomy syndrome, lumbar region   . Epidural abscess   . Abdominal distension   . Encephalopathy   . Weakness of both lower extremities   . Discitis of thoracic region   . Spinal cord stimulator status   . Staphylococcus aureus sepsis ([place]) 03/20/2018  . Bacteremia due to methicillin susceptible Staphylococcus aureus (MSSA) 03/18/2018  . Obesity 03/18/2018  . Nephrolithiasis 03/16/2018  . AKI (acute kidney injury) ([place]) 03/16/2018  . Cognitive impairment 03/16/2018  . Chronic pain syndrome 03/16/2018  . B12 deficiency 01/28/2016  . Memory difficulty 07/23/2015  . History of cerebrovascular disease 07/23/2015  . FH: colon cancer 08/13/2014  . Hx of adenomatous colonic polyps 08/13/2014  . Chronic back pain 05/30/2012  . CAD, NATIVE VESSEL 07/17/2010  . HYPERCHOLESTEROLEMIA 10/31/2009  . SMOKELESS TOBACCO ABUSE 10/31/2009  . Obstructive sleep apnea 10/31/2009  . Essential hypertension, benign 10/31/2009  . GERD 10/31/2009    Kipp Brood, PT, DPT, [facility] Physical Therapist with [facility]  02/12/2019 5:13 PM    [facility] [address] [place], [place], [address] Phone: [phone]   Fax:  [phone]  Name: Cody Velez MRN: [id number] Date of Birth: May 04, 1946

## 2019-02-14 ENCOUNTER — Encounter (HOSPITAL_COMMUNITY): Payer: Self-pay

## 2019-02-14 ENCOUNTER — Ambulatory Visit (HOSPITAL_COMMUNITY): Payer: Medicare Other

## 2019-02-14 DIAGNOSIS — R2689 Other abnormalities of gait and mobility: Secondary | ICD-10-CM | POA: Diagnosis not present

## 2019-02-14 DIAGNOSIS — M6281 Muscle weakness (generalized): Secondary | ICD-10-CM | POA: Diagnosis not present

## 2019-02-14 DIAGNOSIS — R29898 Other symptoms and signs involving the musculoskeletal system: Secondary | ICD-10-CM

## 2019-02-14 NOTE — Therapy (Signed)
[place], [place], [address] Phone: [phone]   Fax:  [phone]  Physical Therapy Treatment  Patient Details  Name: Cody Velez MRN: [id number] Date of Birth: 12/30/1945 Referring Provider (PT): Asencion Noble, MD   Encounter Date: 02/14/2019  PT End of Session - 02/14/19 0943    Visit Number  65    Number of Visits  45    Date for PT Re-Evaluation  02/16/19   MInireassess complete on 01/24/19 visit #57   Authorization Type  Primary: Medicare; Secondary: BC[employer] (83 visits per calendar year)    Authorization Time Period  08/15/18 - 10/14/18; NEW: 10/13/18 to 11/24/18; 11/22/18 - 01/03/19; 01/03/19 to 02/16/19; NEW: 01/24/19-03/16/19    Authorization - Visit Number  20   starting 2020   Authorization - Number of Visits  34    PT Start Time  0944    PT Stop Time  1030    PT Time Calculation (min)  46 min    Equipment Utilized During Treatment  Gait belt    Activity Tolerance  Patient tolerated treatment well    Behavior During Therapy  WFL for tasks assessed/performed       Past Medical History:  Diagnosis Date  . Allergic rhinitis   . Arthritis   . B12 deficiency   . Cervicogenic headache 01/28/2016  . Childhood asthma   . Chronic back pain   . Coronary atherosclerosis of native coronary artery    Mild nonobstructive CAD at catheterization January 2015  . Depression   . Essential hypertension   . Falls   . GERD (gastroesophageal reflux disease)   . History of cerebrovascular disease 07/23/2015  . History of pneumonia 02/2011  . Hyperlipidemia   . Kidney stone   . Memory difficulty 07/23/2015  . OSA (obstructive sleep apnea)    CPAP - Dr. [name]  . Prostate cancer ([place])   . PTSD (post-traumatic stress disorder)    [place]  . Rectal bleeding     Past Surgical History:  Procedure Laterality Date  . APPLICATION OF ROBOTIC ASSISTANCE FOR SPINAL PROCEDURE N/A 03/28/2018   Procedure: APPLICATION OF ROBOTIC  ASSISTANCE FOR SPINAL PROCEDURE;  Surgeon: Kristeen Miss, MD;  Location: [facility];  Service: Neurosurgery;  Laterality: N/A;  . BACK SURGERY  02/14/12   lumbar OR #7; "today redid L1L2; replaced screws; added bone from hip"  . BILATERAL KNEE ARTHROSCOPY    . COLONOSCOPY  10/15/2008   Dr. [name]: tubular adenoma   . COLONOSCOPY  12/17/2003   ONG:EXBMWU rectal and colon  . COLONOSCOPY N/A 09/05/2014   Procedure: COLONOSCOPY;  Surgeon: Daneil Dolin, MD;  [facility];  Service: Endoscopy;  Laterality: N/A;  7:30-rescheduled 9/17 to [place] notified pt  . CYSTOSCOPY WITH STENT PLACEMENT Right 01/27/2016   Procedure: CYSTOSCOPY WITH STENT PLACEMENT;  Surgeon: [name], MD;  Location: [facility];  Service: Urology;  Laterality: Right;  . CYSTOSCOPY/RETROGRADE/URETEROSCOPY/STONE EXTRACTION WITH BASKET Right 01/27/2016   Procedure: CYSTOSCOPY, RIGHT RETROGRADE, RIGHT URETEROSCOPY, STONE EXTRACTION ;  Surgeon: [name], MD;  Location: [facility];  Service: Urology;  Laterality: Right;  . ESOPHAGOGASTRODUODENOSCOPY  10/15/2008     Dr Rourk:Schatzki's ring status post dilation and disruption via 20 F Maloney dilator/ otherwise unremarkable esophagus, small hiatal hernia, multiple fundal gland polyps not manipulated, gastritis, negative H.pylori  . ESOPHAGOGASTRODUODENOSCOPY  06/21/02   XLK:GMWNU sliding hiatal hernia with mild changes of reflux esophagitis limited to gastroesophageal junction.  Noncritical ring at distal esophagus, 3 cm proximal to gastroesophageal junction/Antral gastritis  . [place]   "broke face playing softball"  . FRACTURE SURGERY     "left wrist; broke it; took spur off"  . HOLMIUM LASER APPLICATION Right 1/0/2725   Procedure: HOLMIUM LASER APPLICATION;  Surgeon: [name], MD;  Location: [facility];  Service: Urology;  Laterality: Right;  . KNEE ARTHROSCOPY Right 05/18/2018   Procedure: PARTIAL MEDIAL MENISECTOMY AND SURGICAL LAVAGE  AND CHONDROPLASTY;  Surgeon: [name], MD;  Location: [facility];  Service: Orthopedics;  Laterality: Right;  . LEFT HEART CATHETERIZATION WITH CORONARY ANGIOGRAM N/A 12/27/2013   Procedure: LEFT HEART CATHETERIZATION WITH CORONARY ANGIOGRAM;  Surgeon: [name], MD;  Location: [facility] C[facility];  Service: Cardiovascular;  Laterality: N/A;  . neck epidural    . POSTERIOR LUMBAR FUSION 4 LEVEL N/A 03/28/2018   Procedure: Thoracic eight -Lumbar two- FIXATION WITH SCREW PLACEMENT, DECOMPRESSION Thoracic ten-Thoracic eleven  FOR OSTEOMYELITIS;  Surgeon: Kristeen Miss, MD;  Location: [facility];  Service: Neurosurgery;  Laterality: N/A;  . SHOULDER SURGERY Bilateral   . TEE WITHOUT CARDIOVERSION N/A 03/21/2018   Procedure: TRANSESOPHAGEAL ECHOCARDIOGRAM (TEE) WITH PROPOFOL;  Surgeon: [name], MD;  [facility];  Service: Cardiovascular;  Laterality: N/A;    There were no vitals filed for this visit.  Subjective Assessment - 02/14/19 1044    Subjective  Pt states that his neck is bothering him some today but not too bad.     Pertinent History  T10-11 Epidural Abscess, back surgery on 03/28/18, right knee partial medial menisectomy and chondroplasty    Limitations  Standing;Walking;House hold activities;Lifting    Diagnostic tests  CT Lumbar Spine 05/12/18: "Spinal stimulator device, along with multilevel multisegment fusion hardware appear appropriately placed without adverse features."    Patient Stated Goals  To walk    Currently in Pain?  Yes    Pain Score  3     Pain Location  Neck    Pain Orientation  Mid    Pain Descriptors / Indicators  Aching    Pain Type  Chronic pain    Pain Onset  More than a month ago    Pain Frequency  Constant    Aggravating Factors   unsure    Pain Relieving Factors  unsure    Effect of Pain on Daily Activities  keeps working thru it            [facility] Adult PT Treatment/Exercise - 02/14/19 0001      Ambulation/Gait   Ambulation/Gait  Yes     Ambulation/Gait Assistance  5: Supervision;4: Min guard    Ambulation/Gait Assistance Details  w/c follow for safety    Ambulation Distance (Feet)  150 Feet   45f x3   Assistive device  Rolling walker    Gait Pattern  Decreased dorsiflexion - left;Decreased dorsiflexion - right;Trunk flexed;Decreased step length - right;Decreased step length - left;Step-through pattern      Knee/Hip Exercises: Standing   Functional Squat  2 sets;5 reps    Functional Squat Limitations  +bil 5# dumbbell for counter weight in front of mat    Other Standing Knee Exercises  sidestepping in front of mat x2laps with/without RW (multiple seated breaks in between due to weakness/unsteadiness)    Other Standing Knee Exercises  standing balance +OH press bil 5# DB x10 reps (seated break at rep 6)  PT Education - 02/14/19 0944    Education Details  exercise technique, continue HEP    Person(s) Educated  Patient    Methods  Explanation;Demonstration    Comprehension  Verbalized understanding;Returned demonstration       PT Short Term Goals - 01/24/19 1037      PT SHORT TERM GOAL #1   Title  Patient will report understanding and regular compliance with HEP to improve strength and overall functional mobility.     Baseline  01/24/19 = pt is workign on stretches with assistance from sig other    Time  4    Period  Weeks    Status  Achieved      PT SHORT TERM GOAL #2   Title  Patient will demonstrate improvement of 1/2 MMT strength grade in all muscle groups tested to assist patient with safe transfers, ambulation, and standing.     Baseline  01/24/2019 = Met for all groups with exception of hip abductors bil since evaluation    Time  4    Period  Weeks    Status  Partially Met      PT SHORT TERM GOAL #3   Title  Patient will report or demonstrate ability to perform ambulation for 2 minutes with LRAD with no more than supervision assistance without rest break and reported no more than minimal  difficulty.     Baseline  01/24/19 = amb 4:45 straight with RW and 3 standing rest breaks due to shoulders (amb 2:10 before first standing rest break)    Time  4    Period  Weeks    Status  Achieved      PT SHORT TERM GOAL #4   Title  Patient will demonstrate ability to ambulate 60 feet without rest break using LRAD with no more than minimal assistance.    Time  2    Period  Weeks    Status  Achieved        PT Long Term Goals - 01/24/19 1046      PT LONG TERM GOAL #1   Title  Patient will report or demonstrate ability to perform ambulation for 5 minutes with LRAD with no more than supervision assistance without rest break and reported no more than minimal difficulty.     Baseline  01/24/19 = amb 4:45 straight with RW and 3 standing rest breaks due to shoulders (amb 2:10 before first standing rest break)    Time  6    Period  Weeks    Status  On-going      PT LONG TERM GOAL #2   Title  Patient will report ability to stand at a support surface for 10 minutes without a rest break in order to perform household chores with increased ease.     Baseline  01/24/19 = reports he can do this with support surface    Time  6    Period  Weeks    Status  Achieved      PT LONG TERM GOAL #3   Title  Patient will demonstrate ability to ambulate 120 feet without rest break using LRAD with no more than minimal assistance.    Baseline  01/24/19 = amb 54 ft this date at 2 minutes, and 58 ft at 2:10 seconds before 1st standign rest break, min guard/assist with RW    Time  6    Period  Weeks    Status  On-going      PT LONG TERM  GOAL #4   Title  Patient will perform 5x sit to stand from standard chair with good control and single UE assist in 16 seconds or less to demonstrate improved LE strength for functional transfers.    Baseline  01/24/19 =  from standard chair with UE support and RW 19.8 on first attempt; 14.9 on seconds attempt    Time  6    Period  Weeks    Status  Achieved      PT LONG TERM GOAL  #5   Title  Patient will report regular participation in exercise program at [facility], at least 1x/week, (he is already a Merchandiser, retail) in aquatic setting or on safe/approriate gym equipment to increase independence with strengthening    Time  4    Period  Weeks    Status  New    Target Date  02/21/19      Additional Long Term Goals   Additional Long Term Goals  Yes      PT LONG TERM GOAL #6   Title  Patient will be participating in exercise program at [facility] 2x/week in addition to therapy sessions to demonstrate increased independence and consistency with regular exercise for increased health/wellness.    Time  7    Period  Weeks    Status  New    Target Date  03/14/19            Plan - 02/14/19 1043    Clinical Impression Statement  Continued with established POC focusing on gait, functional strength, and balance. Pt ambulated well this date with RW and supervision/CGA entire time. Able to perform sidestepping in front of mat table this date with/without UE support; pt with multiple bouts of unsteadiness which required him to sit on the table during sidestepping but overall he did well with it. Continued with squats in front of mat table and able to add [facility] press with 5# dumbbells for balance work in front of mat. Continue as planned, progressing as able.     Rehab Potential  Fair    Clinical Impairments Affecting Rehab Potential  Positive: Highly motivated; Negative: Comorbidities    PT Frequency  3x / week   3x/week for 3 weeks; 2x/week for 4 weeks in march   PT Duration  Other (comment)   7 weeks   PT Treatment/Interventions  ADLs/Self Care Home Management;Aquatic Therapy;DME Instruction;Gait training;Functional mobility training;Stair training;Therapeutic activities;Therapeutic exercise;Balance training;Neuromuscular re-education;Patient/family education;Orthotic Fit/Training;Wheelchair mobility training;Manual techniques;Passive range of motion;Energy conservation;Dry  needling;Splinting;Taping    PT Next Visit Plan  Continue with gait training and with functional and machine exercises for LE, sidestepping at mat, etc. Continue to encourage to participate in exercise at [facility] and to use NuStep machine there. Review importance of strengthening and stretching at home and of stretching for improve posture and gait quality. Use machine-based strengthening to improve patient's independence with using equipment at gym.    PT Home Exercise Plan  08/16/18:  seated DF/PF, LAQ, marching, abd with theraband and bridge; 11/13: HS and calf stretching (says he has been performing this at home); 01/24/2019 = prone hip flexor stretch    Consulted and Agree with Plan of Care  Patient       Patient will benefit from skilled therapeutic intervention in order to improve the following deficits and impairments:  Abnormal gait, Decreased balance, Decreased endurance, Decreased mobility, Difficulty walking, Hypomobility, Decreased range of motion, Decreased activity tolerance, Decreased strength, Impaired flexibility  Visit Diagnosis: Other abnormalities of gait and mobility  Other symptoms and signs involving the musculoskeletal system  Muscle weakness (generalized)     Problem List Patient Active Problem List   Diagnosis Date Noted  . Pseudogout of knee   . Pyogenic arthritis of right knee joint ([place])   . Falls frequently 05/08/2018  . Fall   . Sleep disturbance   . Post-op pain   . Hypokalemia   . Morbid obesity ([place])   . Postoperative pain   . DVT, lower extremity, distal, acute, bilateral ([place])   . Acute blood loss anemia   . Anemia of chronic disease   . Essential hypertension   . Bacteremia   . Myelopathy ([place]) 03/30/2018  . Paraplegia ([place])   . Postlaminectomy syndrome, lumbar region   . Epidural abscess   . Abdominal distension   . Encephalopathy   . Weakness of both lower extremities   . Discitis of thoracic region   . Spinal cord stimulator status   .  Staphylococcus aureus sepsis ([place]) 03/20/2018  . Bacteremia due to methicillin susceptible Staphylococcus aureus (MSSA) 03/18/2018  . Obesity 03/18/2018  . Nephrolithiasis 03/16/2018  . AKI (acute kidney injury) ([place]) 03/16/2018  . Cognitive impairment 03/16/2018  . Chronic pain syndrome 03/16/2018  . B12 deficiency 01/28/2016  . Memory difficulty 07/23/2015  . History of cerebrovascular disease 07/23/2015  . FH: colon cancer 08/13/2014  . Hx of adenomatous colonic polyps 08/13/2014  . Chronic back pain 05/30/2012  . CAD, NATIVE VESSEL 07/17/2010  . HYPERCHOLESTEROLEMIA 10/31/2009  . SMOKELESS TOBACCO ABUSE 10/31/2009  . Obstructive sleep apnea 10/31/2009  . Essential hypertension, benign 10/31/2009  . GERD 10/31/2009        Geraldine Solar PT, DPT   [facility] [address] [place], [place], [address] Phone: [phone]   Fax:  [phone]  Name: Cody Velez MRN: [id number] Date of Birth: 1946/01/31

## 2019-02-16 ENCOUNTER — Emergency Department (HOSPITAL_COMMUNITY)
Admission: EM | Admit: 2019-02-16 | Discharge: 2019-02-16 | Disposition: A | Discharge status: Home / Self Care | Payer: Medicare Other | Attending: Emergency Medicine | Admitting: Emergency Medicine

## 2019-02-16 ENCOUNTER — Encounter (HOSPITAL_COMMUNITY): Payer: Self-pay | Admitting: Emergency Medicine

## 2019-02-16 ENCOUNTER — Emergency Department (HOSPITAL_COMMUNITY): Payer: Medicare Other

## 2019-02-16 ENCOUNTER — Other Ambulatory Visit: Payer: Self-pay

## 2019-02-16 ENCOUNTER — Telehealth (HOSPITAL_COMMUNITY): Payer: Self-pay | Admitting: Internal Medicine

## 2019-02-16 ENCOUNTER — Ambulatory Visit (HOSPITAL_COMMUNITY): Payer: Medicare Other | Admitting: Physical Therapy

## 2019-02-16 DIAGNOSIS — Z7982 Long term (current) use of aspirin: Secondary | ICD-10-CM | POA: Insufficient documentation

## 2019-02-16 DIAGNOSIS — Z8546 Personal history of malignant neoplasm of prostate: Secondary | ICD-10-CM | POA: Insufficient documentation

## 2019-02-16 DIAGNOSIS — S8001XA Contusion of right knee, initial encounter: Secondary | ICD-10-CM | POA: Diagnosis not present

## 2019-02-16 DIAGNOSIS — Y998 Other external cause status: Secondary | ICD-10-CM | POA: Insufficient documentation

## 2019-02-16 DIAGNOSIS — S63502A Unspecified sprain of left wrist, initial encounter: Secondary | ICD-10-CM | POA: Diagnosis not present

## 2019-02-16 DIAGNOSIS — I2510 Atherosclerotic heart disease of native coronary artery without angina pectoris: Secondary | ICD-10-CM | POA: Diagnosis not present

## 2019-02-16 DIAGNOSIS — M25561 Pain in right knee: Secondary | ICD-10-CM | POA: Diagnosis not present

## 2019-02-16 DIAGNOSIS — Y9389 Activity, other specified: Secondary | ICD-10-CM | POA: Diagnosis not present

## 2019-02-16 DIAGNOSIS — S8992XA Unspecified injury of left lower leg, initial encounter: Secondary | ICD-10-CM | POA: Diagnosis not present

## 2019-02-16 DIAGNOSIS — I10 Essential (primary) hypertension: Secondary | ICD-10-CM | POA: Diagnosis not present

## 2019-02-16 DIAGNOSIS — M7989 Other specified soft tissue disorders: Secondary | ICD-10-CM | POA: Diagnosis not present

## 2019-02-16 DIAGNOSIS — Z79899 Other long term (current) drug therapy: Secondary | ICD-10-CM | POA: Diagnosis not present

## 2019-02-16 DIAGNOSIS — S8991XA Unspecified injury of right lower leg, initial encounter: Secondary | ICD-10-CM | POA: Diagnosis not present

## 2019-02-16 DIAGNOSIS — Z87891 Personal history of nicotine dependence: Secondary | ICD-10-CM | POA: Diagnosis not present

## 2019-02-16 DIAGNOSIS — W108XXA Fall (on) (from) other stairs and steps, initial encounter: Secondary | ICD-10-CM | POA: Diagnosis not present

## 2019-02-16 DIAGNOSIS — Y92018 Other place in single-family (private) house as the place of occurrence of the external cause: Secondary | ICD-10-CM | POA: Diagnosis not present

## 2019-02-16 DIAGNOSIS — S8000XA Contusion of unspecified knee, initial encounter: Secondary | ICD-10-CM | POA: Diagnosis not present

## 2019-02-16 DIAGNOSIS — M25532 Pain in left wrist: Secondary | ICD-10-CM | POA: Diagnosis not present

## 2019-02-16 DIAGNOSIS — S6992XA Unspecified injury of left wrist, hand and finger(s), initial encounter: Secondary | ICD-10-CM | POA: Diagnosis not present

## 2019-02-16 DIAGNOSIS — M25562 Pain in left knee: Secondary | ICD-10-CM | POA: Diagnosis not present

## 2019-02-16 DIAGNOSIS — S8002XA Contusion of left knee, initial encounter: Secondary | ICD-10-CM | POA: Diagnosis not present

## 2019-02-16 NOTE — Discharge Instructions (Addendum)
Your vital signs are within normal limits.  Your x-ray of your wrist is negative for fracture or dislocation.  Examination favors a sprain of your wrist.  Please use the wrist splint for the next for 5 days.  If the pain in your wrist continues over the next 5 to 7 days, please see your physician for possible MRI concerning your wrist.  The x-ray of your knees are also negative for fracture or dislocation.  The x-ray does show advanced arthritis bilaterally.  Please see your primary physician or return to the emergency department if any changes in your condition, problems, or concerns.

## 2019-02-16 NOTE — Telephone Encounter (Signed)
02/16/19  pt left a message to cx said he fell last night and hurt his left knee and wrist but does want to rescehdule appt.

## 2019-02-16 NOTE — ED Triage Notes (Signed)
Patient is wheelchair bound. Fell down four steps yesterday, and fell the day before. C/O bilateral knee pain and pain to his L hand and wrist. No LOC. No dizziness or nausea since the fall.

## 2019-02-16 NOTE — ED Provider Notes (Signed)
[facility] EMERGENCY DEPARTMENT Provider Note   CSN: [id number] Arrival date & time: 02/16/19  1045    History   Chief Complaint Chief Complaint  Patient presents with  . Fall    HPI Cody Velez is a 73 y.o. male.     Patient is a 73 year old male who presents to the emergency department following a fall.  The patient is wheelchair-bound.  He was visiting at home where the bathroom was up some stairs.  When trying to come down the stairs he fell down about 4 steps on yesterday February 27.  He injured both knees and the left wrist.  He tried conservative measures at home, but when these were not successful in accomplishing the pain he came to the emergency department for evaluation.  The patient denies being on any anticoagulation medications.  No recent operations or procedures involving the knees or wrist.  He presents now for assistance with this issue.  The history is provided by the patient and a relative.  Fall  This is a new problem. Pertinent negatives include no chest pain, no abdominal pain and no shortness of breath.    Past Medical History:  Diagnosis Date  . Allergic rhinitis   . Arthritis   . B12 deficiency   . Cervicogenic headache 01/28/2016  . Childhood asthma   . Chronic back pain   . Coronary atherosclerosis of native coronary artery    Mild nonobstructive CAD at catheterization January 2015  . Depression   . Essential hypertension   . Falls   . GERD (gastroesophageal reflux disease)   . History of cerebrovascular disease 07/23/2015  . History of pneumonia 02/2011  . Hyperlipidemia   . Kidney stone   . Memory difficulty 07/23/2015  . OSA (obstructive sleep apnea)    CPAP - Dr. [name]  . Prostate cancer ([place])   . PTSD (post-traumatic stress disorder)    [place]  . Rectal bleeding     Patient Active Problem List   Diagnosis Date Noted  . Pseudogout of knee   . Pyogenic arthritis of right knee joint ([place])   . Falls frequently 05/08/2018  .  Fall   . Sleep disturbance   . Post-op pain   . Hypokalemia   . Morbid obesity ([place])   . Postoperative pain   . DVT, lower extremity, distal, acute, bilateral ([place])   . Acute blood loss anemia   . Anemia of chronic disease   . Essential hypertension   . Bacteremia   . Myelopathy ([place]) 03/30/2018  . Paraplegia ([place])   . Postlaminectomy syndrome, lumbar region   . Epidural abscess   . Abdominal distension   . Encephalopathy   . Weakness of both lower extremities   . Discitis of thoracic region   . Spinal cord stimulator status   . Staphylococcus aureus sepsis ([place]) 03/20/2018  . Bacteremia due to methicillin susceptible Staphylococcus aureus (MSSA) 03/18/2018  . Obesity 03/18/2018  . Nephrolithiasis 03/16/2018  . AKI (acute kidney injury) ([place]) 03/16/2018  . Cognitive impairment 03/16/2018  . Chronic pain syndrome 03/16/2018  . B12 deficiency 01/28/2016  . Memory difficulty 07/23/2015  . History of cerebrovascular disease 07/23/2015  . FH: colon cancer 08/13/2014  . Hx of adenomatous colonic polyps 08/13/2014  . Chronic back pain 05/30/2012  . CAD, NATIVE VESSEL 07/17/2010  . HYPERCHOLESTEROLEMIA 10/31/2009  . SMOKELESS TOBACCO ABUSE 10/31/2009  . Obstructive sleep apnea 10/31/2009  . Essential hypertension, benign 10/31/2009  . GERD 10/31/2009  Past Surgical History:  Procedure Laterality Date  . APPLICATION OF ROBOTIC ASSISTANCE FOR SPINAL PROCEDURE N/A 03/28/2018   Procedure: APPLICATION OF ROBOTIC ASSISTANCE FOR SPINAL PROCEDURE;  Surgeon: Kristeen Miss, MD;  Location: [place];  Service: Neurosurgery;  Laterality: N/A;  . BACK SURGERY  02/14/12   lumbar OR #7; "today redid L1L2; replaced screws; added bone from hip"  . BILATERAL KNEE ARTHROSCOPY    . COLONOSCOPY  10/15/2008   Dr. [name]: tubular adenoma   . COLONOSCOPY  12/17/2003   DXI:PJASNK rectal and colon  . COLONOSCOPY N/A 09/05/2014   Procedure: COLONOSCOPY;  Surgeon: Daneil Dolin, MD;  [facility];  Service: Endoscopy;  Laterality: N/A;  7:30-rescheduled 9/17 to [place] notified pt  . CYSTOSCOPY WITH STENT PLACEMENT Right 01/27/2016   Procedure: CYSTOSCOPY WITH STENT PLACEMENT;  Surgeon: [name], MD;  Location: [facility];  Service: Urology;  Laterality: Right;  . CYSTOSCOPY/RETROGRADE/URETEROSCOPY/STONE EXTRACTION WITH BASKET Right 01/27/2016   Procedure: CYSTOSCOPY, RIGHT RETROGRADE, RIGHT URETEROSCOPY, STONE EXTRACTION ;  Surgeon: [name], MD;  Location: [facility];  Service: Urology;  Laterality: Right;  . ESOPHAGOGASTRODUODENOSCOPY  10/15/2008     Dr Rourk:Schatzki's ring status post dilation and disruption via 38 F Maloney dilator/ otherwise unremarkable esophagus, small hiatal hernia, multiple fundal gland polyps not manipulated, gastritis, negative H.pylori  . ESOPHAGOGASTRODUODENOSCOPY  06/21/02   NLZ:JQBHA sliding hiatal hernia with mild changes of reflux esophagitis limited to gastroesophageal junction.  Noncritical ring at distal esophagus, 3 cm proximal to gastroesophageal junction/Antral gastritis  . [place]   "broke face playing softball"  . FRACTURE SURGERY     "left wrist; broke it; took spur off"  . HOLMIUM LASER APPLICATION Right 12/28/3788   Procedure: HOLMIUM LASER APPLICATION;  Surgeon: [name], MD;  Location: [facility];  Service: Urology;  Laterality: Right;  . KNEE ARTHROSCOPY Right 05/18/2018   Procedure: PARTIAL MEDIAL MENISECTOMY AND SURGICAL LAVAGE AND CHONDROPLASTY;  Surgeon: [name], MD;  Location: [place];  Service: Orthopedics;  Laterality: Right;  . LEFT HEART CATHETERIZATION WITH CORONARY ANGIOGRAM N/A 12/27/2013   Procedure: LEFT HEART CATHETERIZATION WITH CORONARY ANGIOGRAM;  Surgeon: [name], MD;  Location: [facility] C[facility] LAB;  Service: Cardiovascular;  Laterality: N/A;  . neck epidural    . POSTERIOR LUMBAR FUSION 4 LEVEL N/A 03/28/2018   Procedure: Thoracic eight -Lumbar two- FIXATION WITH SCREW  PLACEMENT, DECOMPRESSION Thoracic ten-Thoracic eleven  FOR OSTEOMYELITIS;  Surgeon: Kristeen Miss, MD;  Location: [place];  Service: Neurosurgery;  Laterality: N/A;  . SHOULDER SURGERY Bilateral   . TEE WITHOUT CARDIOVERSION N/A 03/21/2018   Procedure: TRANSESOPHAGEAL ECHOCARDIOGRAM (TEE) WITH PROPOFOL;  Surgeon: [name], MD;  [facility];  Service: Cardiovascular;  Laterality: N/A;        Home Medications    Prior to Admission medications   Medication Sig Start Date End Date Taking? Authorizing Provider  amLODipine (NORVASC) 10 MG tablet Take 1 tablet (10 mg total) by mouth daily. 05/22/18   Bonnielee Haff, MD  aspirin 81 MG tablet Take 1 tablet (81 mg total) by mouth daily. 05/22/18   Bonnielee Haff, MD  baclofen (LIORESAL) 10 MG tablet TAKE ONE TABLET IN THE MORNING AND MIDDAY, THEN TWO TABLETS AT NIGHT 06/12/18   Kathrynn Ducking, MD  Baclofen 5 MG TABS Take 5 mg by mouth 3 (three) times daily. 05/22/18   Bonnielee Haff, MD  cephALEXin (KEFLEX) 500 MG capsule TAKE ONE CAPSULE (500MG   TOTAL) BY MOUTH THREE TIMES DAILY 11/06/18   Michel Bickers, MD  colchicine 0.6 MG tablet Take 1 tablet (0.6 mg total) by mouth daily. 05/23/18   Bonnielee Haff, MD  cycloSPORINE (RESTASIS) 0.05 % ophthalmic emulsion 1 DROP INTO BOTH EYES TWICE A DAY FOR DRY EYES 05/22/18   Bonnielee Haff, MD  Dalteparin Sodium (FRAGMIN) 02637 UNT/0.72ML SOLN Inject 0.72 mLs into the skin.    [provider]  diazepam (VALIUM) 5 MG tablet Take 1 tablet (5 mg total) by mouth every 6 (six) hours as needed for muscle spasms. 05/22/18   Bonnielee Haff, MD  docusate sodium (COLACE) 100 MG capsule Take 2 capsules (200 mg total) by mouth 2 (two) times daily. 05/22/18   Bonnielee Haff, MD  donepezil (ARICEPT) 10 MG tablet Take 1 tablet (10 mg total) by mouth at bedtime. 05/22/18   Bonnielee Haff, MD  finasteride (PROSCAR) 5 MG tablet Take 1 tablet (5 mg total) by mouth daily. Reported on 05/04/2016 05/22/18    Bonnielee Haff, MD  fluticasone [place]) 50 MCG/ACT nasal spray Place 1 spray into both nostrils daily as needed for allergies. 05/22/18   Bonnielee Haff, MD  gabapentin (NEURONTIN) 400 MG capsule Take 1 capsule (400 mg total) by mouth 3 (three) times daily. 05/22/18   Bonnielee Haff, MD  latanoprost (XALATAN) 0.005 % ophthalmic solution Place 1 drop into both eyes at bedtime. 05/22/18   Bonnielee Haff, MD  losartan (COZAAR) 100 MG tablet Take 100 mg by mouth daily.    [provider]  Melatonin 3 MG TABS Take 0.5 tablets (1.5 mg total) by mouth at bedtime. 05/22/18   Bonnielee Haff, MD  memantine (NAMENDA) 10 MG tablet Take 1 tablet (10 mg total) by mouth 2 (two) times daily. 05/22/18   Bonnielee Haff, MD  omeprazole (PRILOSEC) 20 MG capsule Take 2 capsules (40 mg total) by mouth daily. 05/22/18   Bonnielee Haff, MD  Oxycodone HCl 10 MG TABS Take 1 tablet (10 mg total) by mouth every 4 (four) hours as needed (moderate to severe pain). 05/22/18   Bonnielee Haff, MD  polyethylene glycol [facility] / Floria Raveling) packet Take 17 g by mouth daily. 05/23/18   Bonnielee Haff, MD  potassium chloride SA (K-DUR,KLOR-CON) 20 MEQ tablet Take 1 tablet (20 mEq total) by mouth daily. 05/22/18   Bonnielee Haff, MD  pravastatin (PRAVACHOL) 10 MG tablet Take 1 tablet (10 mg total) by mouth every evening. 05/22/18   Bonnielee Haff, MD  traZODone (DESYREL) 100 MG tablet Take 1 tablet (100 mg total) by mouth at bedtime. 05/22/18   Bonnielee Haff, MD  venlafaxine XR (EFFEXOR-XR) 150 MG 24 hr capsule Take 1 capsule (150 mg total) by mouth 2 (two) times daily. 05/22/18   Bonnielee Haff, MD  vitamin B-12 (CYANOCOBALAMIN) 1000 MCG tablet Take 1 tablet (1,000 mcg total) by mouth daily. 05/22/18   Bonnielee Haff, MD    Family History Family History  Problem Relation Age of Onset  . Emphysema Father   . Heart failure Father   . Lung cancer Father   . CAD Father   . Colon cancer Mother   . Stroke Mother   . Breast cancer  Mother   . Stroke Sister   . Heart attack Brother   . Dementia Paternal Uncle   . Emphysema Maternal Grandmother   . Stroke Maternal Grandmother   . Asthma Other        grandson  . Heart disease Paternal Grandfather   . Anesthesia problems Neg  Hx   . Hypotension Neg Hx   . Malignant hyperthermia Neg Hx   . Pseudochol deficiency Neg Hx     Social History Social History   Tobacco Use  . Smoking status: Former Smoker    Types: Cigarettes    Last attempt to quit: 12/20/1958    Years since quitting: 60.2  . Smokeless tobacco: Former Systems developer    Types: Chew  . Tobacco comment: Quit chewing 04/2015  Substance Use Topics  . Alcohol use: Yes    Alcohol/week: 0.0 standard drinks    Comment: couple bottles of wine per month  . Drug use: No    Types: Marijuana    Comment: "last used marijuana ~ 1969"     Allergies   Patient has no known allergies.   Review of Systems Review of Systems  Constitutional: Negative for activity change.       All ROS Neg except as noted in HPI  HENT: Negative for nosebleeds.   Eyes: Negative for photophobia and discharge.  Respiratory: Negative for cough, shortness of breath and wheezing.   Cardiovascular: Negative for chest pain and palpitations.  Gastrointestinal: Negative for abdominal pain and blood in stool.  Genitourinary: Negative for dysuria, frequency and hematuria.  Musculoskeletal: Positive for arthralgias. Negative for back pain and neck pain.  Skin: Negative.   Neurological: Negative for dizziness, seizures and speech difficulty.  Psychiatric/Behavioral: Negative for confusion and hallucinations.     Physical Exam Updated Vital Signs BP 139/74 (BP Location: Right Arm)   Pulse 92   Temp 97.8 F (36.6 C) (Oral)   Resp 16   Ht 6\' 3"  (1.905 m)   Wt (!) 139.7 kg   SpO2 95%   BMI 38.50 kg/m   Physical Exam Vitals signs and nursing note reviewed.  Constitutional:      Appearance: He is well-developed. He is not toxic-appearing.    HENT:     Head: Normocephalic.     Right Ear: Tympanic membrane and external ear normal.     Left Ear: Tympanic membrane and external ear normal.  Eyes:     General: Lids are normal.     Pupils: Pupils are equal, round, and reactive to light.  Neck:     Musculoskeletal: Normal range of motion and neck supple.     Vascular: No carotid bruit.  Cardiovascular:     Rate and Rhythm: Normal rate and regular rhythm.     Pulses: Normal pulses.     Heart sounds: Normal heart sounds.  Pulmonary:     Effort: No respiratory distress.     Breath sounds: Normal breath sounds.  Abdominal:     General: Bowel sounds are normal.     Palpations: Abdomen is soft.     Tenderness: There is no abdominal tenderness. There is no guarding.  Musculoskeletal:     Left wrist: He exhibits tenderness. He exhibits no swelling and no deformity.     Right knee: He exhibits swelling. He exhibits no deformity. Tenderness found.     Left knee: He exhibits swelling. He exhibits no deformity. Tenderness found.  Lymphadenopathy:     Head:     Right side of head: No submandibular adenopathy.     Left side of head: No submandibular adenopathy.     Cervical: No cervical adenopathy.  Skin:    General: Skin is warm and dry.  Neurological:     Mental Status: He is alert and oriented to person, place, and time.  Cranial Nerves: No cranial nerve deficit.     Sensory: No sensory deficit.  Psychiatric:        Speech: Speech normal.      ED Treatments / Results  Labs (all labs ordered are listed, but only abnormal results are displayed) Labs Reviewed - No data to display  EKG None  Radiology Dg Wrist Complete Left  Result Date: 02/16/2019 CLINICAL DATA:  Post fall, now with left wrist pain. EXAM: LEFT WRIST - COMPLETE 3+ VIEW COMPARISON:  None. FINDINGS: Old ulnar styloid process fracture. No acute fracture or dislocation. Moderate degenerative change involving the STT joints of the base of the thumb with  joint space loss, subchondral sclerosis and osteophytosis. Tiny geode/cyst noted within the lunate. Remaining joint spaces appear preserved. No evidence of chondrocalcinosis. Regional soft tissues appear normal. IMPRESSION: 1. No fracture. If the patient has pain referable to the anatomic snuff box, splinting and a follow-up radiograph in 10 to 14 days is recommended to evaluate for occult scaphoid fracture. 2. Old ulnar styloid process fracture. 3. Moderate degenerative change of the base of the thumb. Electronically Signed   By: Sandi Mariscal M.D.   On: 02/16/2019 11:52   Dg Knee Complete 4 Views Left  Result Date: 02/16/2019 CLINICAL DATA:  Post fall, now with left knee pain. EXAM: LEFT KNEE - COMPLETE 4+ VIEW COMPARISON:  Left knee radiographs-05/08/2018 FINDINGS: No fracture or dislocation. Severe tricompartmental degenerative change of the knee, worse with the medial compartment with near complete joint space loss, bone-on-bone articulation, subchondral sclerosis and osteophytosis. Chondrocalcinosis is seen within the lateral joint space. Small knee joint effusion, similar to the 05/08/2018 examination. Diffuse soft tissue swelling about the anterior aspect of the knee, similar to the 05/08/2018 examination. No radiopaque foreign body. IMPRESSION: 1. Diffuse soft tissue swelling about the anterior aspect of the knee and small knee joint effusion, similar to the 05/08/2018 examination. 2. Severe tricompartmental degenerative change of the knee, worse within the medial compartment with near complete joint space loss. 3. Chondrocalcinosis as could be seen in the setting of CPPD. Electronically Signed   By: Sandi Mariscal M.D.   On: 02/16/2019 11:54   Dg Knee Complete 4 Views Right  Result Date: 02/16/2019 CLINICAL DATA:  Post fall last evening, now with right knee pain. EXAM: RIGHT KNEE - COMPLETE 4+ VIEW COMPARISON:  Right knee radiographs-05/08/2018 FINDINGS: Lateral radiograph is degraded due to obliquity.  No definite fracture or dislocation. Moderate to severe tricompartmental degenerative change of the knee, likely worse within the patellofemoral joint with joint space loss, articular surface irregularity, subchondral sclerosis and osteophytosis. Note is made of a small amount of chondrocalcinosis within both the medial and lateral joint spaces. Small knee joint effusion. Diffuse soft tissue swelling about the knee, unchanged. No radiopaque foreign body. IMPRESSION: 1. Diffuse soft tissue swelling about the knee and small knee joint effusion, similar to the 04/2018 examination, without definitive acute findings. 2. Moderate to severe tricompartmental degenerative change of the knee, worse within the patellofemoral joint. 3. Chondrocalcinosis as could be seen in the setting of CPPD. Electronically Signed   By: Sandi Mariscal M.D.   On: 02/16/2019 11:50    Procedures Procedures (including critical care time)  Medications Ordered in ED Medications - No data to display   Initial Impression / Assessment and Plan / ED Course  I have reviewed the triage vital signs and the nursing notes.  Pertinent labs & imaging results that were available during my care of the  patient were reviewed by me and considered in my medical decision making (see chart for details).          Final Clinical Impressions(s) / ED Diagnoses MDM  Vital signs reviewed.  Pulse oximetry is 95% on room air.  Within normal limits by my interpretation.  There are no gross neurologic deficits of the upper or lower extremity.  The patient has pain with flexion and extension of the left wrist.  He has pain to palpation and attempted flexion and extension of the right and left knee.  X-ray of the right and left knee reveals soft tissue swelling anteriorly.  There is a small joint effusion present, but this is similar to what was present on previous examination.  There is tricompartment degenerative joint disease present.  No fracture  appreciated. X-ray of the left wrist shows no fracture present.  Degenerative joint disease changes were noted.  The patient has some soreness and tenderness near the anatomic snuffbox.  The patient was placed in a Velcro wrist splint.  I have asked the patient to see orthopedics if this pain is not improved in the next 3 or 4 days.  The patient is in agreement with this plan.   Final diagnoses:  Left wrist sprain, initial encounter  Contusion of knee, initial encounter    ED Discharge Orders    None       Lily Kocher, PA-C 02/17/19 [place], [employer], DO 02/19/19 [phone]

## 2019-02-19 DIAGNOSIS — D509 Iron deficiency anemia, unspecified: Secondary | ICD-10-CM | POA: Diagnosis not present

## 2019-02-20 ENCOUNTER — Ambulatory Visit (HOSPITAL_COMMUNITY): Payer: Medicare Other | Attending: Internal Medicine

## 2019-02-20 ENCOUNTER — Other Ambulatory Visit: Payer: Self-pay

## 2019-02-20 ENCOUNTER — Encounter (HOSPITAL_COMMUNITY): Payer: Self-pay

## 2019-02-20 DIAGNOSIS — R29898 Other symptoms and signs involving the musculoskeletal system: Secondary | ICD-10-CM | POA: Insufficient documentation

## 2019-02-20 DIAGNOSIS — M6281 Muscle weakness (generalized): Secondary | ICD-10-CM | POA: Diagnosis not present

## 2019-02-20 DIAGNOSIS — R2689 Other abnormalities of gait and mobility: Secondary | ICD-10-CM | POA: Diagnosis not present

## 2019-02-20 NOTE — Therapy (Signed)
[facility] [place], [place], [address] Phone: [phone]   Fax:  [phone]  Physical Therapy Aquatic Treatment  Patient Details  Name: Cody Velez MRN: [id number] Date of Birth: June 29, 1946 Referring Provider (PT): Asencion Noble, MD   Encounter Date: 02/20/2019  PT End of Session - 02/20/19 1722    Visit Number  45    Number of Visits  30    Date for PT Re-Evaluation  02/16/19   MInireassess complete on 01/24/19 visit #57   Authorization Type  Primary: Medicare; Secondary: BC[employer] (2 visits per calendar year)    Authorization Time Period  08/15/18 - 10/14/18; NEW: 10/13/18 to 11/24/18; 11/22/18 - 01/03/19; 01/03/19 to 02/16/19; NEW: 01/24/19-03/16/19    Authorization - Visit Number  21   starting 2020   Authorization - Number of Visits  76    PT Start Time  4259    PT Stop Time  1436    PT Time Calculation (min)  43 min    Equipment Utilized During Treatment  Gait belt    Activity Tolerance  Patient tolerated treatment well    Behavior During Therapy  WFL for tasks assessed/performed       Past Medical History:  Diagnosis Date  . Allergic rhinitis   . Arthritis   . B12 deficiency   . Cervicogenic headache 01/28/2016  . Childhood asthma   . Chronic back pain   . Coronary atherosclerosis of native coronary artery    Mild nonobstructive CAD at catheterization January 2015  . Depression   . Essential hypertension   . Falls   . GERD (gastroesophageal reflux disease)   . History of cerebrovascular disease 07/23/2015  . History of pneumonia 02/2011  . Hyperlipidemia   . Kidney stone   . Memory difficulty 07/23/2015  . OSA (obstructive sleep apnea)    CPAP - Dr. [name]  . Prostate cancer ([place])   . PTSD (post-traumatic stress disorder)    [place]  . Rectal bleeding     Past Surgical History:  Procedure Laterality Date  . APPLICATION OF ROBOTIC ASSISTANCE FOR SPINAL PROCEDURE N/A 03/28/2018   Procedure: APPLICATION OF ROBOTIC  ASSISTANCE FOR SPINAL PROCEDURE;  Surgeon: Kristeen Miss, MD;  Location: [facility];  Service: Neurosurgery;  Laterality: N/A;  . BACK SURGERY  02/14/12   lumbar OR #7; "today redid L1L2; replaced screws; added bone from hip"  . BILATERAL KNEE ARTHROSCOPY    . COLONOSCOPY  10/15/2008   Dr. [name]: tubular adenoma   . COLONOSCOPY  12/17/2003   DGL:OVFIEP rectal and colon  . COLONOSCOPY N/A 09/05/2014   Procedure: COLONOSCOPY;  Surgeon: Daneil Dolin, MD;  [facility];  Service: Endoscopy;  Laterality: N/A;  7:30-rescheduled 9/17 to [place] notified pt  . CYSTOSCOPY WITH STENT PLACEMENT Right 01/27/2016   Procedure: CYSTOSCOPY WITH STENT PLACEMENT;  Surgeon: [name], MD;  Location: [facility];  Service: Urology;  Laterality: Right;  . CYSTOSCOPY/RETROGRADE/URETEROSCOPY/STONE EXTRACTION WITH BASKET Right 01/27/2016   Procedure: CYSTOSCOPY, RIGHT RETROGRADE, RIGHT URETEROSCOPY, STONE EXTRACTION ;  Surgeon: [name], MD;  Location: [facility];  Service: Urology;  Laterality: Right;  . ESOPHAGOGASTRODUODENOSCOPY  10/15/2008     Dr Rourk:Schatzki's ring status post dilation and disruption via 59 F Maloney dilator/ otherwise unremarkable esophagus, small hiatal hernia, multiple fundal gland polyps not manipulated, gastritis, negative H.pylori  . ESOPHAGOGASTRODUODENOSCOPY  06/21/02   PIR:JJOAC sliding hiatal hernia with mild changes of reflux esophagitis limited to gastroesophageal  junction.  Noncritical ring at distal esophagus, 3 cm proximal to gastroesophageal junction/Antral gastritis  . [place]   "broke face playing softball"  . FRACTURE SURGERY     "left wrist; broke it; took spur off"  . HOLMIUM LASER APPLICATION Right 04/24/3874   Procedure: HOLMIUM LASER APPLICATION;  Surgeon: [name], MD;  Location: [facility];  Service: Urology;  Laterality: Right;  . KNEE ARTHROSCOPY Right 05/18/2018   Procedure: PARTIAL MEDIAL MENISECTOMY AND SURGICAL LAVAGE  AND CHONDROPLASTY;  Surgeon: [name], MD;  Location: [facility];  Service: Orthopedics;  Laterality: Right;  . LEFT HEART CATHETERIZATION WITH CORONARY ANGIOGRAM N/A 12/27/2013   Procedure: LEFT HEART CATHETERIZATION WITH CORONARY ANGIOGRAM;  Surgeon: [name], MD;  Location: [facility];  Service: Cardiovascular;  Laterality: N/A;  . neck epidural    . POSTERIOR LUMBAR FUSION 4 LEVEL N/A 03/28/2018   Procedure: Thoracic eight -Lumbar two- FIXATION WITH SCREW PLACEMENT, DECOMPRESSION Thoracic ten-Thoracic eleven  FOR OSTEOMYELITIS;  Surgeon: Kristeen Miss, MD;  Location: [facility];  Service: Neurosurgery;  Laterality: N/A;  . SHOULDER SURGERY Bilateral   . TEE WITHOUT CARDIOVERSION N/A 03/21/2018   Procedure: TRANSESOPHAGEAL ECHOCARDIOGRAM (TEE) WITH PROPOFOL;  Surgeon: [name], MD;  [facility];  Service: Cardiovascular;  Laterality: N/A;    There were no vitals filed for this visit.  Subjective Assessment - 02/20/19 1712    Subjective  Patient reports he fell last Thursday and that is why he was unable to come in for therapy on Friday. He reports it happened at Tony's sisters house where he went up 4 steps from the garrage to be able to use the bathroom. He made it up but reports he fell 1x in the batroom. Then he visited and when they had to leave he fell down the stairs and landed on concrete hitting his knees, left arm/wrist and head. He reports [name] fell also. He states he went to the ED Friday and was given medicine and a wrist splint for his left hand. He states there were no factures found with x-rays and he is feeling much better today than he has been.    Pertinent History  T10-11 Epidural Abscess, back surgery on 03/28/18, right knee partial medial menisectomy and chondroplasty    Limitations  Standing;Walking;House hold activities;Lifting    Diagnostic tests  CT Lumbar Spine 05/12/18: "Spinal stimulator device, along with multilevel multisegment fusion hardware appear  appropriately placed without adverse features."    Patient Stated Goals  To walk    Currently in Pain?  No/denies       Adult Aquatic Therapy - 02/20/19 1717      Aquatic Therapy Subjective   Subjective  Patient reports he fell last Thursday and that is why he was unable to come in for therapy on Friday. He reports it happened at Tony's sisters house where he went up 4 steps from the garrage to be able to use the bathroom. He made it up but reports he fell 1x in the batroom. Then he visited and when they had to leave he fell down the stairs and landed on concrete hitting his knees, left arm/wrist and head. He reports [name] fell also. He states he went to the ED Friday and was given medicine and a wrist splint for his left hand. He states there were no factures found with x-rays and he is feeling much better today than he has been.  Treatment   Gait  Forward gait with no UE assist throughout, ~ 60'.     Exercises  1x 15 reps bil LE hamstring curl with 5lb weights. 1x 15 reps hip abduction bil LE with 5lb weights. 1x 15 reps hip extension kick bil LE with 5 lb weight. 1x 20 reps squat with UE support.    Hip/Low Back  Attempted LTR but discontinued. Seated lumbar twist/rotation on platform, 10x 10 second holds bil. 2x 30 secon holds for hamstring stretch bil LE.         PT Education - 02/20/19 1721    Education Details  Educated on importance of HEP. Discussed resources available through the [place] to improve bathroom accessibility at home and to improve independence with hand control for adapted driving.     Person(s) Educated  Patient    Methods  Explanation    Comprehension  Verbalized understanding       PT Short Term Goals - 01/24/19 1037      PT SHORT TERM GOAL #1   Title  Patient will report understanding and regular compliance with HEP to improve strength and overall functional mobility.     Baseline  01/24/19 = pt is workign on stretches with assistance from sig other    Time  4     Period  Weeks    Status  Achieved      PT SHORT TERM GOAL #2   Title  Patient will demonstrate improvement of 1/2 MMT strength grade in all muscle groups tested to assist patient with safe transfers, ambulation, and standing.     Baseline  01/24/2019 = Met for all groups with exception of hip abductors bil since evaluation    Time  4    Period  Weeks    Status  Partially Met      PT SHORT TERM GOAL #3   Title  Patient will report or demonstrate ability to perform ambulation for 2 minutes with LRAD with no more than supervision assistance without rest break and reported no more than minimal difficulty.     Baseline  01/24/19 = amb 4:45 straight with RW and 3 standing rest breaks due to shoulders (amb 2:10 before first standing rest break)    Time  4    Period  Weeks    Status  Achieved      PT SHORT TERM GOAL #4   Title  Patient will demonstrate ability to ambulate 60 feet without rest break using LRAD with no more than minimal assistance.    Time  2    Period  Weeks    Status  Achieved        PT Long Term Goals - 01/24/19 1046      PT LONG TERM GOAL #1   Title  Patient will report or demonstrate ability to perform ambulation for 5 minutes with LRAD with no more than supervision assistance without rest break and reported no more than minimal difficulty.     Baseline  01/24/19 = amb 4:45 straight with RW and 3 standing rest breaks due to shoulders (amb 2:10 before first standing rest break)    Time  6    Period  Weeks    Status  On-going      PT LONG TERM GOAL #2   Title  Patient will report ability to stand at a support surface for 10 minutes without a rest break in order to perform household chores with increased ease.  Baseline  01/24/19 = reports he can do this with support surface    Time  6    Period  Weeks    Status  Achieved      PT LONG TERM GOAL #3   Title  Patient will demonstrate ability to ambulate 120 feet without rest break using LRAD with no more than minimal  assistance.    Baseline  01/24/19 = amb 54 ft this date at 2 minutes, and 58 ft at 2:10 seconds before 1st standign rest break, min guard/assist with RW    Time  6    Period  Weeks    Status  On-going      PT LONG TERM GOAL #4   Title  Patient will perform 5x sit to stand from standard chair with good control and single UE assist in 16 seconds or less to demonstrate improved LE strength for functional transfers.    Baseline  01/24/19 =  from standard chair with UE support and RW 19.8 on first attempt; 14.9 on seconds attempt    Time  6    Period  Weeks    Status  Achieved      PT LONG TERM GOAL #5   Title  Patient will report regular participation in exercise program at [facility], at least 1x/week, (he is already a Merchandiser, retail) in aquatic setting or on safe/approriate gym equipment to increase independence with strengthening    Time  4    Period  Weeks    Status  New    Target Date  02/21/19      Additional Long Term Goals   Additional Long Term Goals  Yes      PT LONG TERM GOAL #6   Title  Patient will be participating in exercise program at [facility] 2x/week in addition to therapy sessions to demonstrate increased independence and consistency with regular exercise for increased health/wellness.    Time  7    Period  Weeks    Status  New    Target Date  03/14/19        Plan - 02/20/19 1723    Clinical Impression Statement  Patient arrived for aquatic therapy session and reported he fell last Thursday. He denies any injury and was seen at the ED on Friday and had x-rays ruled out any acute fractures. He was able to perform standing exercises with no knee pain but was limited with lumbar stretches for lower trunk rotation due to Lt shoulder pain. He was able to complete a modified seated version of this stretch. He will continue to benefit from skilled PT interventions to address impairments and progress towards goals to improve QOL and independence.    Rehab Potential  Fair    Clinical  Impairments Affecting Rehab Potential  Positive: Highly motivated; Negative: Comorbidities    PT Frequency  3x / week   3x/week for 3 weeks; 2x/week for 4 weeks in march   PT Duration  Other (comment)   7 weeks   PT Treatment/Interventions  ADLs/Self Care Home Management;Aquatic Therapy;DME Instruction;Gait training;Functional mobility training;Stair training;Therapeutic activities;Therapeutic exercise;Balance training;Neuromuscular re-education;Patient/family education;Orthotic Fit/Training;Wheelchair mobility training;Manual techniques;Passive range of motion;Energy conservation;Dry needling;Splinting;Taping    PT Next Visit Plan  Re-assess (10th visit). Continue with gait training and with functional and machine exercises for LE, sidestepping at mat, etc. Continue to encourage to participate in exercise at [facility] and to use NuStep machine there. Review importance of strengthening and stretching at home and of stretching for improve posture  and gait quality. Use machine-based strengthening to improve patient's independence with using equipment at gym.    PT Home Exercise Plan  08/16/18:  seated DF/PF, LAQ, marching, abd with theraband and bridge; 11/13: HS and calf stretching (says he has been performing this at home); 01/24/2019 = prone hip flexor stretch    Consulted and Agree with Plan of Care  Patient       Patient will benefit from skilled therapeutic intervention in order to improve the following deficits and impairments:  Abnormal gait, Decreased balance, Decreased endurance, Decreased mobility, Difficulty walking, Hypomobility, Decreased range of motion, Decreased activity tolerance, Decreased strength, Impaired flexibility  Visit Diagnosis: Other abnormalities of gait and mobility  Other symptoms and signs involving the musculoskeletal system  Muscle weakness (generalized)     Problem List Patient Active Problem List   Diagnosis Date Noted  . Pseudogout of knee   . Pyogenic  arthritis of right knee joint ([place])   . Falls frequently 05/08/2018  . Fall   . Sleep disturbance   . Post-op pain   . Hypokalemia   . Morbid obesity ([place])   . Postoperative pain   . DVT, lower extremity, distal, acute, bilateral ([place])   . Acute blood loss anemia   . Anemia of chronic disease   . Essential hypertension   . Bacteremia   . Myelopathy ([place]) 03/30/2018  . Paraplegia ([place])   . Postlaminectomy syndrome, lumbar region   . Epidural abscess   . Abdominal distension   . Encephalopathy   . Weakness of both lower extremities   . Discitis of thoracic region   . Spinal cord stimulator status   . Staphylococcus aureus sepsis ([place]) 03/20/2018  . Bacteremia due to methicillin susceptible Staphylococcus aureus (MSSA) 03/18/2018  . Obesity 03/18/2018  . Nephrolithiasis 03/16/2018  . AKI (acute kidney injury) ([place]) 03/16/2018  . Cognitive impairment 03/16/2018  . Chronic pain syndrome 03/16/2018  . B12 deficiency 01/28/2016  . Memory difficulty 07/23/2015  . History of cerebrovascular disease 07/23/2015  . FH: colon cancer 08/13/2014  . Hx of adenomatous colonic polyps 08/13/2014  . Chronic back pain 05/30/2012  . CAD, NATIVE VESSEL 07/17/2010  . HYPERCHOLESTEROLEMIA 10/31/2009  . SMOKELESS TOBACCO ABUSE 10/31/2009  . Obstructive sleep apnea 10/31/2009  . Essential hypertension, benign 10/31/2009  . GERD 10/31/2009    Kipp Brood, PT, DPT, [facility] Physical Therapist with [facility]  02/20/2019 5:24 PM    [facility] [address] [place], [place], [address] Phone: [phone]   Fax:  [phone]  Name: DEVRIN MONFORTE MRN: [id number] Date of Birth: 31-Oct-1946

## 2019-02-22 ENCOUNTER — Ambulatory Visit (HOSPITAL_COMMUNITY): Payer: Medicare Other

## 2019-02-22 ENCOUNTER — Encounter (HOSPITAL_COMMUNITY): Payer: Self-pay

## 2019-02-22 DIAGNOSIS — M6281 Muscle weakness (generalized): Secondary | ICD-10-CM

## 2019-02-22 DIAGNOSIS — R29898 Other symptoms and signs involving the musculoskeletal system: Secondary | ICD-10-CM

## 2019-02-22 DIAGNOSIS — R2689 Other abnormalities of gait and mobility: Secondary | ICD-10-CM

## 2019-02-22 NOTE — Therapy (Signed)
[place], [place], [address] Phone: [phone]   Fax:  [phone]   Progress Note Reporting Period 2/5//20 to 02/22/19  See note below for Objective Data and Assessment of Progress/Goals.   Physical Therapy Treatment  Patient Details  Name: Cody Velez MRN: [id number] Date of Birth: 12/08/1946 Referring Provider (PT): Asencion Noble, MD   Encounter Date: 02/22/2019  PT End of Session - 02/22/19 0946    Visit Number  1    Number of Visits  76    Date for PT Re-Evaluation  03/16/19   corrected date for re-eval to match PT cert; mini reassess completed 02/22/19 visit #67   Authorization Type  Primary: Medicare; Secondary: BCBS (15 visits per calendar year)    Authorization Time Period  08/15/18 - 10/14/18; NEW: 10/13/18 to 11/24/18; 11/22/18 - 01/03/19; 01/03/19 to 02/16/19; NEW: 01/24/19-03/16/19    Authorization - Visit Number  31   starting 2020   Authorization - Number of Visits  63    PT Start Time  0945    PT Stop Time  1025    PT Time Calculation (min)  40 min    Equipment Utilized During Treatment  Gait belt    Activity Tolerance  Patient tolerated treatment well    Behavior During Therapy  [facility] for tasks assessed/performed       Past Medical History:  Diagnosis Date  . Allergic rhinitis   . Arthritis   . B12 deficiency   . Cervicogenic headache 01/28/2016  . Childhood asthma   . Chronic back pain   . Coronary atherosclerosis of native coronary artery    Mild nonobstructive CAD at catheterization January 2015  . Depression   . Essential hypertension   . Falls   . GERD (gastroesophageal reflux disease)   . History of cerebrovascular disease 07/23/2015  . History of pneumonia 02/2011  . Hyperlipidemia   . Kidney stone   . Memory difficulty 07/23/2015  . OSA (obstructive sleep apnea)    CPAP - Dr. [name]  . Prostate cancer ([place])   . PTSD (post-traumatic stress disorder)    [place]  . Rectal bleeding      Past Surgical History:  Procedure Laterality Date  . APPLICATION OF ROBOTIC ASSISTANCE FOR SPINAL PROCEDURE N/A 03/28/2018   Procedure: APPLICATION OF ROBOTIC ASSISTANCE FOR SPINAL PROCEDURE;  Surgeon: Kristeen Miss, MD;  Location: [facility];  Service: Neurosurgery;  Laterality: N/A;  . BACK SURGERY  02/14/12   lumbar OR #7; "today redid L1L2; replaced screws; added bone from hip"  . BILATERAL KNEE ARTHROSCOPY    . COLONOSCOPY  10/15/2008   Dr. [name]: tubular adenoma   . COLONOSCOPY  12/17/2003   HYW:VPXTGG rectal and colon  . COLONOSCOPY N/A 09/05/2014   Procedure: COLONOSCOPY;  Surgeon: Daneil Dolin, MD;  [facility];  Service: Endoscopy;  Laterality: N/A;  7:30-rescheduled 9/17 to [place] notified pt  . CYSTOSCOPY WITH STENT PLACEMENT Right 01/27/2016   Procedure: CYSTOSCOPY WITH STENT PLACEMENT;  Surgeon: [name], MD;  [facility];  Service: Urology;  Laterality: Right;  . CYSTOSCOPY/RETROGRADE/URETEROSCOPY/STONE EXTRACTION WITH BASKET Right 01/27/2016   Procedure: CYSTOSCOPY, RIGHT RETROGRADE, RIGHT URETEROSCOPY, STONE EXTRACTION ;  Surgeon: [name], MD;  Location: [facility];  Service: Urology;  Laterality: Right;  . ESOPHAGOGASTRODUODENOSCOPY  10/15/2008     Dr Rourk:Schatzki's ring status post dilation and disruption via 36 F Maloney dilator/ otherwise unremarkable esophagus, small hiatal hernia, multiple  fundal gland polyps not manipulated, gastritis, negative H.pylori  . ESOPHAGOGASTRODUODENOSCOPY  06/21/02   [facility]:CWCBJ sliding hiatal hernia with mild changes of reflux esophagitis limited to gastroesophageal junction.  Noncritical ring at distal esophagus, 3 cm proximal to gastroesophageal junction/Antral gastritis  . [place]   "broke face playing softball"  . FRACTURE SURGERY     "left wrist; broke it; took spur off"  . HOLMIUM LASER APPLICATION Right 05/21/8314   Procedure: HOLMIUM LASER APPLICATION;  Surgeon: [name], MD;  Location: [facility];  Service: Urology;  Laterality: Right;  . KNEE ARTHROSCOPY Right 05/18/2018   Procedure: PARTIAL MEDIAL MENISECTOMY AND SURGICAL LAVAGE AND CHONDROPLASTY;  Surgeon: [name], MD;  Location: [facility];  Service: Orthopedics;  Laterality: Right;  . LEFT HEART CATHETERIZATION WITH CORONARY ANGIOGRAM N/A 12/27/2013   Procedure: LEFT HEART CATHETERIZATION WITH CORONARY ANGIOGRAM;  Surgeon: [name], MD;  Location: [facility] CATH [facility];  Service: Cardiovascular;  Laterality: N/A;  . neck epidural    . POSTERIOR LUMBAR FUSION 4 LEVEL N/A 03/28/2018   Procedure: Thoracic eight -Lumbar two- FIXATION WITH SCREW PLACEMENT, DECOMPRESSION Thoracic ten-Thoracic eleven  FOR OSTEOMYELITIS;  Surgeon: Kristeen Miss, MD;  Location: [facility];  Service: Neurosurgery;  Laterality: N/A;  . SHOULDER SURGERY Bilateral   . TEE WITHOUT CARDIOVERSION N/A 03/21/2018   Procedure: TRANSESOPHAGEAL ECHOCARDIOGRAM (TEE) WITH PROPOFOL;  Surgeon: [name], MD;  [facility];  Service: Cardiovascular;  Laterality: N/A;    There were no vitals filed for this visit.  Subjective Assessment - 02/22/19 0949    Subjective  Pt states that he is feeling better overall since his fall.     Pertinent History  T10-11 Epidural Abscess, back surgery on 03/28/18, right knee partial medial menisectomy and chondroplasty    Limitations  Standing;Walking;House hold activities;Lifting    Diagnostic tests  CT Lumbar Spine 05/12/18: "Spinal stimulator device, along with multilevel multisegment fusion hardware appear appropriately placed without adverse features."    Patient Stated Goals  To walk    Currently in Pain?  No/denies         [facility] PT Assessment - 02/22/19 0001      Assessment   Medical Diagnosis  T10-11 Epidural Abscess "Paraplegia"    Referring Provider (PT)  Asencion Noble, MD    Onset Date/Surgical Date  03/28/18    Next MD Visit  Dr. Willey Blade 02/26/19    Prior Therapy  Yes, inpatient rehab and  home health which patient reported ended 08/04/18      Strength   Right Hip Extension  2+/5   was 3-   Right Hip ABduction  3-/5   was 3-   Left Hip Extension  2+/5   was 3-   Left Hip ABduction  3-/5   was 3-   Right Knee Flexion  4+/5   was 4+   Left Knee Flexion  4+/5   was 4+     Ambulation/Gait   Ambulation/Gait  Yes    Ambulation/Gait Assistance  4: Min guard;4: Min assist    Ambulation/Gait Assistance Details  w/c follow for safety    Ambulation Distance (Feet)  8 Feet   limited due to R knee pain and buckling   Assistive device  Rolling walker    Gait Pattern  Decreased dorsiflexion - left;Decreased dorsiflexion - right;Trunk flexed;Decreased step length - right;Decreased step length - left;Step-through pattern           PT Education - 02/22/19  [place]    Education Details  reassessment findings    Person(s) Educated  Patient    Methods  Explanation;Demonstration    Comprehension  Verbalized understanding       PT Short Term Goals - 02/22/19 0954      PT SHORT TERM GOAL #1   Title  Patient will report understanding and regular compliance with HEP to improve strength and overall functional mobility.     Baseline  01/24/19 = pt is workign on stretches with assistance from sig other    Time  4    Period  Weeks    Status  Achieved      PT SHORT TERM GOAL #2   Title  Patient will demonstrate improvement of 1/2 MMT strength grade in all muscle groups tested to assist patient with safe transfers, ambulation, and standing.     Baseline  01/24/2019 = Met for all groups with exception of hip abductors bil since evaluation    Time  4    Period  Weeks    Status  Partially Met      PT SHORT TERM GOAL #3   Title  Patient will report or demonstrate ability to perform ambulation for 2 minutes with LRAD with no more than supervision assistance without rest break and reported no more than minimal difficulty.     Baseline  01/24/19 = amb 4:45 straight with RW and 3 standing rest  breaks due to shoulders (amb 2:10 before first standing rest break)    Time  4    Period  Weeks    Status  Achieved      PT SHORT TERM GOAL #4   Title  Patient will demonstrate ability to ambulate 60 feet without rest break using LRAD with no more than minimal assistance.    Time  2    Period  Weeks    Status  Achieved        PT Long Term Goals - 02/22/19 1517      PT LONG TERM GOAL #1   Title  Patient will report or demonstrate ability to perform ambulation for 5 minutes with LRAD with no more than supervision assistance without rest break and reported no more than minimal difficulty.     Baseline  01/24/19 = amb 4:45 straight with RW and 3 standing rest breaks due to shoulders (amb 2:10 before first standing rest break)    Time  6    Period  Weeks    Status  On-going      PT LONG TERM GOAL #2   Title  Patient will report ability to stand at a support surface for 10 minutes without a rest break in order to perform household chores with increased ease.     Baseline  01/24/19 = reports he can do this with support surface    Time  6    Period  Weeks    Status  Achieved      PT LONG TERM GOAL #3   Title  Patient will demonstrate ability to ambulate 120 feet without rest break using LRAD with no more than minimal assistance.    Baseline  01/24/19 = amb 54 ft this date at 2 minutes, and 58 ft at 2:10 seconds before 1st standign rest break, min guard/assist with RW    Time  6    Period  Weeks    Status  On-going      PT LONG TERM GOAL #4   Title  Patient  will perform 5x sit to stand from standard chair with good control and single UE assist in 16 seconds or less to demonstrate improved LE strength for functional transfers.    Baseline  01/24/19 =  from standard chair with UE support and RW 19.8 on first attempt; 14.9 on seconds attempt    Time  6    Period  Weeks    Status  Achieved      PT LONG TERM GOAL #5   Title  Patient will report regular participation in exercise program at  [facility], at least 1x/week, (he is already a Merchandiser, retail) in aquatic setting or on safe/approriate gym equipment to increase independence with strengthening    Baseline  3/5: intended to start but since his fall last week they haven't gone    Time  4    Period  Weeks    Status  On-going      PT LONG TERM GOAL #6   Title  Patient will be participating in exercise program at [facility] 2x/week in addition to therapy sessions to demonstrate increased independence and consistency with regular exercise for increased health/wellness.    Baseline  3/5: intended to start but since his fall last week they haven't gone    Time  7    Period  Weeks    Status  On-going            Plan - 02/22/19 1207    Clinical Impression Statement  PT reassessed pt's goals and outcome measures this date. Pt sustained a fall last Thursday night and has had increased R knee pain and L wrist pain ever since. Pt's hip strength has essentially remained the same, with a slight decrease in hip ext, which is likely due to being sore from his fall. Ambulation greatly limited this date due to R knee pain from his recent fall, however, in recent visits, pt has been ambulating anywhere between 11f - 2261fwith RW, w/c follow, and min guard-supervision for assist. Pt is verbalizing more consistent participation with his HEP and states that once he starts to not be as sore from his fall, [facility] to steadily implement safe HEP and aquatics therapy routine once he is ready for d/c.    Rehab Potential  Fair    Clinical Impairments Affecting Rehab Potential  Positive: Highly motivated; Negative: Comorbidities    PT Frequency  3x / week   3x/week for 3 weeks; 2x/week for 4 weeks in march   PT Duration  Other (comment)   7 weeks   PT Treatment/Interventions  ADLs/Self Care Home Management;Aquatic Therapy;DME Instruction;Gait training;Functional mobility  training;Stair training;Therapeutic activities;Therapeutic exercise;Balance training;Neuromuscular re-education;Patient/family education;Orthotic Fit/Training;Wheelchair mobility training;Manual techniques;Passive range of motion;Energy conservation;Dry needling;Splinting;Taping    PT Next Visit Plan  steadily add to HEP for pt to have solid routine once ready for d/c; Continue with gait training and with functional and machine exercises for LE, sidestepping at mat, etc. [facility]. Review importance of strengthening and stretching at home and of stretching for improve posture and gait quality. Use machine-based strengthening to improve patient's independence with using equipment at gym.    PT Home Exercise Plan  08/16/18:  seated DF/PF, LAQ, marching, abd with theraband and bridge; 11/13: HS and calf stretching (says he has been performing this at home); 01/24/2019 = prone hip flexor stretch  Consulted and Agree with Plan of Care  Patient       Patient will benefit from skilled therapeutic intervention in order to improve the following deficits and impairments:  Abnormal gait, Decreased balance, Decreased endurance, Decreased mobility, Difficulty walking, Hypomobility, Decreased range of motion, Decreased activity tolerance, Decreased strength, Impaired flexibility  Visit Diagnosis: Other abnormalities of gait and mobility  Other symptoms and signs involving the musculoskeletal system  Muscle weakness (generalized)     Problem List Patient Active Problem List   Diagnosis Date Noted  . Pseudogout of knee   . Pyogenic arthritis of right knee joint ([place])   . Falls frequently 05/08/2018  . Fall   . Sleep disturbance   . Post-op pain   . Hypokalemia   . Morbid obesity ([place])   . Postoperative pain   . DVT, lower extremity, distal, acute, bilateral ([place])   . Acute blood loss anemia   . Anemia of chronic disease    . Essential hypertension   . Bacteremia   . Myelopathy ([place]) 03/30/2018  . Paraplegia ([place])   . Postlaminectomy syndrome, lumbar region   . Epidural abscess   . Abdominal distension   . Encephalopathy   . Weakness of both lower extremities   . Discitis of thoracic region   . Spinal cord stimulator status   . Staphylococcus aureus sepsis ([place]) 03/20/2018  . Bacteremia due to methicillin susceptible Staphylococcus aureus (MSSA) 03/18/2018  . Obesity 03/18/2018  . Nephrolithiasis 03/16/2018  . AKI (acute kidney injury) ([place]) 03/16/2018  . Cognitive impairment 03/16/2018  . Chronic pain syndrome 03/16/2018  . B12 deficiency 01/28/2016  . Memory difficulty 07/23/2015  . History of cerebrovascular disease 07/23/2015  . FH: colon cancer 08/13/2014  . Hx of adenomatous colonic polyps 08/13/2014  . Chronic back pain 05/30/2012  . CAD, NATIVE VESSEL 07/17/2010  . HYPERCHOLESTEROLEMIA 10/31/2009  . SMOKELESS TOBACCO ABUSE 10/31/2009  . Obstructive sleep apnea 10/31/2009  . Essential hypertension, benign 10/31/2009  . GERD 10/31/2009       Geraldine Solar PT, DPT  [facility] [address] [place], [place], [address] Phone: [phone]   Fax:  [phone]  Name: Cody Velez MRN: [id number] Date of Birth: 22-Feb-1946

## 2019-02-26 DIAGNOSIS — M1000 Idiopathic gout, unspecified site: Secondary | ICD-10-CM | POA: Diagnosis not present

## 2019-02-26 DIAGNOSIS — Z6837 Body mass index (BMI) 37.0-37.9, adult: Secondary | ICD-10-CM | POA: Diagnosis not present

## 2019-02-26 DIAGNOSIS — D509 Iron deficiency anemia, unspecified: Secondary | ICD-10-CM | POA: Diagnosis not present

## 2019-02-26 DIAGNOSIS — I10 Essential (primary) hypertension: Secondary | ICD-10-CM | POA: Diagnosis not present

## 2019-02-26 DIAGNOSIS — M4644 Discitis, unspecified, thoracic region: Secondary | ICD-10-CM | POA: Diagnosis not present

## 2019-02-27 ENCOUNTER — Ambulatory Visit (HOSPITAL_COMMUNITY): Payer: Medicare Other

## 2019-02-27 ENCOUNTER — Other Ambulatory Visit: Payer: Self-pay

## 2019-02-27 ENCOUNTER — Encounter (HOSPITAL_COMMUNITY): Payer: Self-pay

## 2019-02-27 DIAGNOSIS — M6281 Muscle weakness (generalized): Secondary | ICD-10-CM | POA: Diagnosis not present

## 2019-02-27 DIAGNOSIS — R2689 Other abnormalities of gait and mobility: Secondary | ICD-10-CM

## 2019-02-27 DIAGNOSIS — R29898 Other symptoms and signs involving the musculoskeletal system: Secondary | ICD-10-CM | POA: Diagnosis not present

## 2019-02-27 NOTE — Therapy (Signed)
[facility] [place], [place], [address] Phone: [phone]   Fax:  [phone]  Physical Therapy Aquatic Treatment  Patient Details  Name: Cody Velez MRN: [id number] Date of Birth: 1946-05-07 Referring Provider (PT): Asencion Noble, MD   Encounter Date: 02/27/2019  PT End of Session - 02/27/19 1540    Visit Number  63    Number of Visits  64    Date for PT Re-Evaluation  03/16/19   corrected date for re-eval to match PT cert; mini reassess completed 02/22/19 visit #67   Authorization Type  Primary: Medicare; Secondary: BC[employer] (24 visits per calendar year)    Authorization Time Period  08/15/18 - 10/14/18; NEW: 10/13/18 to 11/24/18; 11/22/18 - 01/03/19; 01/03/19 to 02/16/19; NEW: 01/24/19-03/16/19    Authorization - Visit Number  23   starting 2020   Authorization - Number of Visits  65    PT Start Time  1352    PT Stop Time  1436    PT Time Calculation (min)  44 min    Equipment Utilized During Treatment  Gait belt    Activity Tolerance  Patient tolerated treatment well    Behavior During Therapy  WFL for tasks assessed/performed       Past Medical History:  Diagnosis Date  . Allergic rhinitis   . Arthritis   . B12 deficiency   . Cervicogenic headache 01/28/2016  . Childhood asthma   . Chronic back pain   . Coronary atherosclerosis of native coronary artery    Mild nonobstructive CAD at catheterization January 2015  . Depression   . Essential hypertension   . Falls   . GERD (gastroesophageal reflux disease)   . History of cerebrovascular disease 07/23/2015  . History of pneumonia 02/2011  . Hyperlipidemia   . Kidney stone   . Memory difficulty 07/23/2015  . OSA (obstructive sleep apnea)    CPAP - Dr. [name]  . Prostate cancer ([place])   . PTSD (post-traumatic stress disorder)    [place]  . Rectal bleeding     Past Surgical History:  Procedure Laterality Date  . APPLICATION OF ROBOTIC ASSISTANCE FOR SPINAL PROCEDURE N/A  03/28/2018   Procedure: APPLICATION OF ROBOTIC ASSISTANCE FOR SPINAL PROCEDURE;  Surgeon: Kristeen Miss, MD;  Location: [facility];  Service: Neurosurgery;  Laterality: N/A;  . BACK SURGERY  02/14/12   lumbar OR #7; "today redid L1L2; replaced screws; added bone from hip"  . BILATERAL KNEE ARTHROSCOPY    . COLONOSCOPY  10/15/2008   Dr. [name]: tubular adenoma   . COLONOSCOPY  12/17/2003   GQQ:PYPPJK rectal and colon  . COLONOSCOPY N/A 09/05/2014   Procedure: COLONOSCOPY;  Surgeon: Daneil Dolin, MD;  [facility];  Service: Endoscopy;  Laterality: N/A;  7:30-rescheduled 9/17 to [place] notified pt  . CYSTOSCOPY WITH STENT PLACEMENT Right 01/27/2016   Procedure: CYSTOSCOPY WITH STENT PLACEMENT;  Surgeon: [name], MD;  Location: [facility];  Service: Urology;  Laterality: Right;  . CYSTOSCOPY/RETROGRADE/URETEROSCOPY/STONE EXTRACTION WITH BASKET Right 01/27/2016   Procedure: CYSTOSCOPY, RIGHT RETROGRADE, RIGHT URETEROSCOPY, STONE EXTRACTION ;  Surgeon: [name], MD;  Location: [facility];  Service: Urology;  Laterality: Right;  . ESOPHAGOGASTRODUODENOSCOPY  10/15/2008     Dr Rourk:Schatzki's ring status post dilation and disruption via 30 F Maloney dilator/ otherwise unremarkable esophagus, small hiatal hernia, multiple fundal gland polyps not manipulated, gastritis, negative H.pylori  . ESOPHAGOGASTRODUODENOSCOPY  06/21/02   DTO:IZTIW sliding hiatal hernia with  mild changes of reflux esophagitis limited to gastroesophageal junction.  Noncritical ring at distal esophagus, 3 cm proximal to gastroesophageal junction/Antral gastritis  . [place]   "broke face playing softball"  . FRACTURE SURGERY     "left wrist; broke it; took spur off"  . HOLMIUM LASER APPLICATION Right 01/27/3150   Procedure: HOLMIUM LASER APPLICATION;  Surgeon: [name], MD;  Location[facility];  Service: Urology;  Laterality: Right;  . KNEE ARTHROSCOPY Right 05/18/2018   Procedure:  PARTIAL MEDIAL MENISECTOMY AND SURGICAL LAVAGE AND CHONDROPLASTY;  Surgeon: [name], MD;  Location: [facility];  Service: Orthopedics;  Laterality: Right;  . LEFT HEART CATHETERIZATION WITH CORONARY ANGIOGRAM N/A 12/27/2013   Procedure: LEFT HEART CATHETERIZATION WITH CORONARY ANGIOGRAM;  Surgeon: [name], MD;  Location: [facility];  Service: Cardiovascular;  Laterality: N/A;  . neck epidural    . POSTERIOR LUMBAR FUSION 4 LEVEL N/A 03/28/2018   Procedure: Thoracic eight -Lumbar two- FIXATION WITH SCREW PLACEMENT, DECOMPRESSION Thoracic ten-Thoracic eleven  FOR OSTEOMYELITIS;  Surgeon: Kristeen Miss, MD;  Location: [name];  Service: Neurosurgery;  Laterality: N/A;  . SHOULDER SURGERY Bilateral   . TEE WITHOUT CARDIOVERSION N/A 03/21/2018   Procedure: TRANSESOPHAGEAL ECHOCARDIOGRAM (TEE) WITH PROPOFOL;  Surgeon: [name], MD;  [facility];  Service: Cardiovascular;  Laterality: N/A;    There were no vitals filed for this visit.  Subjective Assessment - 02/27/19 1554    Subjective  Patient reports [name] was able to build the bike that they ordered for him to use. He reports he saw Dr. Willey Blade yesterday and was taken off his BP medication as his BP was good and has remained stable. He states yesterday it was around 116/70.     Pertinent History  T10-11 Epidural Abscess, back surgery on 03/28/18, right knee partial medial menisectomy and chondroplasty    Limitations  Standing;Walking;House hold activities;Lifting    Diagnostic tests  CT Lumbar Spine 05/12/18: "Spinal stimulator device, along with multilevel multisegment fusion hardware appear appropriately placed without adverse features."    Patient Stated Goals  To walk    Currently in Pain?  No/denies       Adult Aquatic Therapy - 02/27/19 1605      Aquatic Therapy Subjective   Subjective  Patient reports [name] was able to build the bike that they ordered for him to use. He reports he saw Dr. Willey Blade yesterday and was taken  off his BP medication as his BP was good and has remained stable. He states yesterday it was around 116/70      Treatment   Gait  3x 15' RT, grapevie stepping for LE coordination drill, no UE support. 2x forward gait 50' for lap around pool area. 2x 40' bicycle kick (first bout with 1 HHA at wall, 2nd bout no UE for doggey paddle swim)    Exercises  1x 20 reps squat at pool wall. 1x 15 reps heel raises.    Hip/Low Back  LTR in deep water (in corner for shoulder comfort) 10 reps bil, 5 sec holds each direction. 2x 15 reps jumping jack kicks from pool wall. 2x 15 reps flutter kicks form pool wall. 2x 10 reps breaststroke kick from pool wall.         PT Education - 02/27/19 1600    Education Details  Discussed plan to develop aquatic exercise program for patient to use as part of HEP. Educated patient on adaptive program for people  with spinal cord injuries that exist around the country including [employer] projects, [employer], and checking with the [place] about programs they offer for adapted driving and home improvement to increase accessibility of bathroom and kitchen.     Person(s) Educated  Patient    Methods  Explanation    Comprehension  Verbalized understanding       PT Short Term Goals - 02/22/19 0954      PT SHORT TERM GOAL #1   Title  Patient will report understanding and regular compliance with HEP to improve strength and overall functional mobility.     Baseline  01/24/19 = pt is workign on stretches with assistance from sig other    Time  4    Period  Weeks    Status  Achieved      PT SHORT TERM GOAL #2   Title  Patient will demonstrate improvement of 1/2 MMT strength grade in all muscle groups tested to assist patient with safe transfers, ambulation, and standing.     Baseline  01/24/2019 = Met for all groups with exception of hip abductors bil since evaluation    Time  4    Period  Weeks    Status  Partially Met      PT SHORT TERM GOAL #3   Title  Patient will report  or demonstrate ability to perform ambulation for 2 minutes with LRAD with no more than supervision assistance without rest break and reported no more than minimal difficulty.     Baseline  01/24/19 = amb 4:45 straight with RW and 3 standing rest breaks due to shoulders (amb 2:10 before first standing rest break)    Time  4    Period  Weeks    Status  Achieved      PT SHORT TERM GOAL #4   Title  Patient will demonstrate ability to ambulate 60 feet without rest break using LRAD with no more than minimal assistance.    Time  2    Period  Weeks    Status  Achieved        PT Long Term Goals - 02/22/19 3893      PT LONG TERM GOAL #1   Title  Patient will report or demonstrate ability to perform ambulation for 5 minutes with LRAD with no more than supervision assistance without rest break and reported no more than minimal difficulty.     Baseline  01/24/19 = amb 4:45 straight with RW and 3 standing rest breaks due to shoulders (amb 2:10 before first standing rest break)    Time  6    Period  Weeks    Status  On-going      PT LONG TERM GOAL #2   Title  Patient will report ability to stand at a support surface for 10 minutes without a rest break in order to perform household chores with increased ease.     Baseline  01/24/19 = reports he can do this with support surface    Time  6    Period  Weeks    Status  Achieved      PT LONG TERM GOAL #3   Title  Patient will demonstrate ability to ambulate 120 feet without rest break using LRAD with no more than minimal assistance.    Baseline  01/24/19 = amb 54 ft this date at 2 minutes, and 58 ft at 2:10 seconds before 1st standign rest break, min guard/assist with RW  Time  6    Period  Weeks    Status  On-going      PT LONG TERM GOAL #4   Title  Patient will perform 5x sit to stand from standard chair with good control and single UE assist in 16 seconds or less to demonstrate improved LE strength for functional transfers.    Baseline  01/24/19 =   from standard chair with UE support and RW 19.8 on first attempt; 14.9 on seconds attempt    Time  6    Period  Weeks    Status  Achieved      PT LONG TERM GOAL #5   Title  Patient will report regular participation in exercise program at [facility], at least 1x/week, (he is already a Merchandiser, retail) in aquatic setting or on safe/approriate gym equipment to increase independence with strengthening    Baseline  3/5: intended to start but since his fall last week they haven't gone    Time  4    Period  Weeks    Status  On-going      PT LONG TERM GOAL #6   Title  Patient will be participating in exercise program at [name] 2x/week in addition to therapy sessions to demonstrate increased independence and consistency with regular exercise for increased health/wellness.    Baseline  3/5: intended to start but since his fall last week they haven't gone    Time  7    Period  Weeks    Status  On-going         Plan - 02/27/19 1605    Clinical Impression Statement  Start of session focused on gait activities with grapevine and forward gait to improve patient's step pattern. During forward gait he had 1 episode of scissoring with Rt LE crossing over Lt, he was able to maintain balance independently. Patient completed LE strengthening with no pain this session and was able to complete LTR in corner of pool as it allowed for comfortable shoulder positioning. Patient performed quick kicks in deep water and maintained hips elevated in prone position for 50% of the exercises. He was able to bicycle kick and pull with bil UE's to "doggey paddle" form deep water to shallow section of the pool. Patient will continue to benefit from skilled PT interventions to address impairments and progress mobility.     Rehab Potential  Fair    Clinical Impairments Affecting Rehab Potential  Positive: Highly motivated; Negative: Comorbidities    PT Frequency  3x / week   3x/week for 3 weeks; 2x/week for 4 weeks in march   PT Duration   Other (comment)   7 weeks   PT Treatment/Interventions  ADLs/Self Care Home Management;Aquatic Therapy;DME Instruction;Gait training;Functional mobility training;Stair training;Therapeutic activities;Therapeutic exercise;Balance training;Neuromuscular re-education;Patient/family education;Orthotic Fit/Training;Wheelchair mobility training;Manual techniques;Passive range of motion;Energy conservation;Dry needling;Splinting;Taping    PT Next Visit Plan  Steadily add to HEP for pt to have solid routine once ready for d/c. Create aquatic HEP. Continue with gait training and with functional and machine exercises for LE, sidestepping at mat, etc. Continue to encourage to participate in exercise at [facility] and to use NuStep machine there. Review importance of strengthening and stretching at home and of stretching for improve posture and gait quality. Use machine-based strengthening to improve patient's independence with using equipment at gym.    PT Home Exercise Plan  08/16/18:  seated DF/PF, LAQ, marching, abd with theraband and bridge; 11/13: HS and calf stretching (says he has been performing  this at home); 01/24/2019 = prone hip flexor stretch    Consulted and Agree with Plan of Care  Patient       Patient will benefit from skilled therapeutic intervention in order to improve the following deficits and impairments:  Abnormal gait, Decreased balance, Decreased endurance, Decreased mobility, Difficulty walking, Hypomobility, Decreased range of motion, Decreased activity tolerance, Decreased strength, Impaired flexibility  Visit Diagnosis: Other abnormalities of gait and mobility  Other symptoms and signs involving the musculoskeletal system  Muscle weakness (generalized)     Problem List Patient Active Problem List   Diagnosis Date Noted  . Pseudogout of knee   . Pyogenic arthritis of right knee joint ([place])   . Falls frequently 05/08/2018  . Fall   . Sleep disturbance   . Post-op pain   .  Hypokalemia   . Morbid obesity ([place])   . Postoperative pain   . DVT, lower extremity, distal, acute, bilateral ([place])   . Acute blood loss anemia   . Anemia of chronic disease   . Essential hypertension   . Bacteremia   . Myelopathy ([place]) 03/30/2018  . Paraplegia ([place])   . Postlaminectomy syndrome, lumbar region   . Epidural abscess   . Abdominal distension   . Encephalopathy   . Weakness of both lower extremities   . Discitis of thoracic region   . Spinal cord stimulator status   . Staphylococcus aureus sepsis ([place]) 03/20/2018  . Bacteremia due to methicillin susceptible Staphylococcus aureus (MSSA) 03/18/2018  . Obesity 03/18/2018  . Nephrolithiasis 03/16/2018  . AKI (acute kidney injury) ([place]) 03/16/2018  . Cognitive impairment 03/16/2018  . Chronic pain syndrome 03/16/2018  . B12 deficiency 01/28/2016  . Memory difficulty 07/23/2015  . History of cerebrovascular disease 07/23/2015  . FH: colon cancer 08/13/2014  . Hx of adenomatous colonic polyps 08/13/2014  . Chronic back pain 05/30/2012  . CAD, NATIVE VESSEL 07/17/2010  . HYPERCHOLESTEROLEMIA 10/31/2009  . SMOKELESS TOBACCO ABUSE 10/31/2009  . Obstructive sleep apnea 10/31/2009  . Essential hypertension, benign 10/31/2009  . GERD 10/31/2009    Kipp Brood, PT, DPT, [facility] Physical Therapist with [facility]  02/27/2019 4:10 PM    [facility] [address] [place], [place], [address] Phone: [phone]   Fax:  [phone]  Name: MONTRELL CESSNA MRN: [id number] Date of Birth: 1946-06-30

## 2019-02-28 ENCOUNTER — Telehealth (HOSPITAL_COMMUNITY): Payer: Self-pay | Admitting: Internal Medicine

## 2019-02-28 NOTE — Telephone Encounter (Signed)
02/28/19  Pt called to change the appt time and date

## 2019-03-01 ENCOUNTER — Other Ambulatory Visit: Payer: Self-pay

## 2019-03-01 ENCOUNTER — Ambulatory Visit (HOSPITAL_COMMUNITY): Payer: Medicare Other

## 2019-03-01 ENCOUNTER — Encounter (HOSPITAL_COMMUNITY): Payer: Self-pay

## 2019-03-01 DIAGNOSIS — R2689 Other abnormalities of gait and mobility: Secondary | ICD-10-CM

## 2019-03-01 DIAGNOSIS — M6281 Muscle weakness (generalized): Secondary | ICD-10-CM | POA: Diagnosis not present

## 2019-03-01 DIAGNOSIS — R29898 Other symptoms and signs involving the musculoskeletal system: Secondary | ICD-10-CM | POA: Diagnosis not present

## 2019-03-01 NOTE — Therapy (Signed)
[facility] [place], [place], [address] Phone: [phone]   Fax:  [phone]  Physical Therapy Treatment  Patient Details  Name: Cody Velez MRN: [id number] Date of Birth: 1946-07-31 Referring Provider (PT): Asencion Noble, MD   Encounter Date: 03/01/2019  PT End of Session - 03/01/19 1243    Visit Number  80    Number of Visits  76    Date for PT Re-Evaluation  03/16/19   corrected date for re-eval to match PT cert; mini reassess completed 02/22/19 visit #67   Authorization Type  Primary: Medicare; Secondary: BCBS (21 visits per calendar year)    Authorization Time Period  08/15/18 - 10/14/18; NEW: 10/13/18 to 11/24/18; 11/22/18 - 01/03/19; 01/03/19 to 02/16/19; NEW: 01/24/19-03/16/19    Authorization - Visit Number  24   starting 2020   Authorization - Number of Visits  70    PT Start Time  1033    PT Stop Time  1113    PT Time Calculation (min)  40 min    Equipment Utilized During Treatment  Gait belt    Activity Tolerance  Patient tolerated treatment well    Behavior During Therapy  WFL for tasks assessed/performed       Past Medical History:  Diagnosis Date  . Allergic rhinitis   . Arthritis   . B12 deficiency   . Cervicogenic headache 01/28/2016  . Childhood asthma   . Chronic back pain   . Coronary atherosclerosis of native coronary artery    Mild nonobstructive CAD at catheterization January 2015  . Depression   . Essential hypertension   . Falls   . GERD (gastroesophageal reflux disease)   . History of cerebrovascular disease 07/23/2015  . History of pneumonia 02/2011  . Hyperlipidemia   . Kidney stone   . Memory difficulty 07/23/2015  . OSA (obstructive sleep apnea)    CPAP - Dr. [name]  . Prostate cancer ([place])   . PTSD (post-traumatic stress disorder)    [place]  . Rectal bleeding     Past Surgical History:  Procedure Laterality Date  . APPLICATION OF ROBOTIC ASSISTANCE FOR SPINAL PROCEDURE N/A 03/28/2018    Procedure: APPLICATION OF ROBOTIC ASSISTANCE FOR SPINAL PROCEDURE;  Surgeon: Kristeen Miss, MD;  Location: [facility];  Service: Neurosurgery;  Laterality: N/A;  . BACK SURGERY  02/14/12   lumbar OR #7; "today redid L1L2; replaced screws; added bone from hip"  . BILATERAL KNEE ARTHROSCOPY    . COLONOSCOPY  10/15/2008   Dr. [name]: tubular adenoma   . COLONOSCOPY  12/17/2003   [facility]:HENIDP rectal and colon  . COLONOSCOPY N/A 09/05/2014   Procedure: COLONOSCOPY;  Surgeon: Daneil Dolin, MD;  [facility];  Service: Endoscopy;  Laterality: N/A;  7:30-rescheduled 9/17 to [place] notified pt  . CYSTOSCOPY WITH STENT PLACEMENT Right 01/27/2016   Procedure: CYSTOSCOPY WITH STENT PLACEMENT;  Surgeon: [name], MD;  Location: [facility];  Service: Urology;  Laterality: Right;  . CYSTOSCOPY/RETROGRADE/URETEROSCOPY/STONE EXTRACTION WITH BASKET Right 01/27/2016   Procedure: CYSTOSCOPY, RIGHT RETROGRADE, RIGHT URETEROSCOPY, STONE EXTRACTION ;  Surgeon: [name], MD;  Location: [facility];  Service: Urology;  Laterality: Right;  . ESOPHAGOGASTRODUODENOSCOPY  10/15/2008     Dr Rourk:Schatzki's ring status post dilation and disruption via 3 F Maloney dilator/ otherwise unremarkable esophagus, small hiatal hernia, multiple fundal gland polyps not manipulated, gastritis, negative H.pylori  . ESOPHAGOGASTRODUODENOSCOPY  06/21/02   [facility]:MPNTI sliding hiatal hernia with mild  changes of reflux esophagitis limited to gastroesophageal junction.  Noncritical ring at distal esophagus, 3 cm proximal to gastroesophageal junction/Antral gastritis  . [place]   "broke face playing softball"  . FRACTURE SURGERY     "left wrist; broke it; took spur off"  . HOLMIUM LASER APPLICATION Right 04/26/8324   Procedure: HOLMIUM LASER APPLICATION;  Surgeon: [name], MD;  Location: [facility];  Service: Urology;  Laterality: Right;  . KNEE ARTHROSCOPY Right 05/18/2018   Procedure: PARTIAL  MEDIAL MENISECTOMY AND SURGICAL LAVAGE AND CHONDROPLASTY;  Surgeon: [name], MD;  Location: [facility];  Service: Orthopedics;  Laterality: Right;  . LEFT HEART CATHETERIZATION WITH CORONARY ANGIOGRAM N/A 12/27/2013   Procedure: LEFT HEART CATHETERIZATION WITH CORONARY ANGIOGRAM;  Surgeon: [name], MD;  Location: [facility] CATH LAB;  Service: Cardiovascular;  Laterality: N/A;  . neck epidural    . POSTERIOR LUMBAR FUSION 4 LEVEL N/A 03/28/2018   Procedure: Thoracic eight -Lumbar two- FIXATION WITH SCREW PLACEMENT, DECOMPRESSION Thoracic ten-Thoracic eleven  FOR OSTEOMYELITIS;  Surgeon: Kristeen Miss, MD;  Location: [facility];  Service: Neurosurgery;  Laterality: N/A;  . SHOULDER SURGERY Bilateral   . TEE WITHOUT CARDIOVERSION N/A 03/21/2018   Procedure: TRANSESOPHAGEAL ECHOCARDIOGRAM (TEE) WITH PROPOFOL;  Surgeon: [name], MD;  [facility];  Service: Cardiovascular;  Laterality: N/A;    There were no vitals filed for this visit.  Subjective Assessment - 03/01/19 1252    Subjective  Pt states that he is doing well today.     Pertinent History  T10-11 Epidural Abscess, back surgery on 03/28/18, right knee partial medial menisectomy and chondroplasty    Limitations  Standing;Walking;House hold activities;Lifting    Diagnostic tests  CT Lumbar Spine 05/12/18: "Spinal stimulator device, along with multilevel multisegment fusion hardware appear appropriately placed without adverse features."    Patient Stated Goals  To walk    Currently in Pain?  No/denies           [facility] Adult PT Treatment/Exercise - 03/01/19 0001      Ambulation/Gait   Ambulation/Gait  Yes    Ambulation/Gait Assistance  4: Min guard;4: Min assist    Ambulation/Gait Assistance Details  w/c follow for safety    Ambulation Distance (Feet)  90 Feet   88f, 217f 5032f   Knee/Hip Exercises: Standing   Other Standing Knee Exercises  sidestepping in front of mat x2laps with/without RW (2-3 seated breaks in  between due to weakness/unsteadiness)      Knee/Hip Exercises: Seated   Sit to Sand  5 reps;without UE support   5" eccentric lower           PT Education - 03/01/19 1253    Education Details  education on his situation and healthy coping strategies    Person(s) Educated  Patient    Methods  Explanation    Comprehension  Verbalized understanding       PT Short Term Goals - 02/22/19 0954      PT SHORT TERM GOAL #1   Title  Patient will report understanding and regular compliance with HEP to improve strength and overall functional mobility.     Baseline  01/24/19 = pt is workign on stretches with assistance from sig other    Time  4    Period  Weeks    Status  Achieved      PT SHORT TERM GOAL #2   Title  Patient will demonstrate improvement of 1/2  MMT strength grade in all muscle groups tested to assist patient with safe transfers, ambulation, and standing.     Baseline  01/24/2019 = Met for all groups with exception of hip abductors bil since evaluation    Time  4    Period  Weeks    Status  Partially Met      PT SHORT TERM GOAL #3   Title  Patient will report or demonstrate ability to perform ambulation for 2 minutes with LRAD with no more than supervision assistance without rest break and reported no more than minimal difficulty.     Baseline  01/24/19 = amb 4:45 straight with RW and 3 standing rest breaks due to shoulders (amb 2:10 before first standing rest break)    Time  4    Period  Weeks    Status  Achieved      PT SHORT TERM GOAL #4   Title  Patient will demonstrate ability to ambulate 60 feet without rest break using LRAD with no more than minimal assistance.    Time  2    Period  Weeks    Status  Achieved        PT Long Term Goals - 02/22/19 2831      PT LONG TERM GOAL #1   Title  Patient will report or demonstrate ability to perform ambulation for 5 minutes with LRAD with no more than supervision assistance without rest break and reported no more than  minimal difficulty.     Baseline  01/24/19 = amb 4:45 straight with RW and 3 standing rest breaks due to shoulders (amb 2:10 before first standing rest break)    Time  6    Period  Weeks    Status  On-going      PT LONG TERM GOAL #2   Title  Patient will report ability to stand at a support surface for 10 minutes without a rest break in order to perform household chores with increased ease.     Baseline  01/24/19 = reports he can do this with support surface    Time  6    Period  Weeks    Status  Achieved      PT LONG TERM GOAL #3   Title  Patient will demonstrate ability to ambulate 120 feet without rest break using LRAD with no more than minimal assistance.    Baseline  01/24/19 = amb 54 ft this date at 2 minutes, and 58 ft at 2:10 seconds before 1st standign rest break, min guard/assist with RW    Time  6    Period  Weeks    Status  On-going      PT LONG TERM GOAL #4   Title  Patient will perform 5x sit to stand from standard chair with good control and single UE assist in 16 seconds or less to demonstrate improved LE strength for functional transfers.    Baseline  01/24/19 =  from standard chair with UE support and RW 19.8 on first attempt; 14.9 on seconds attempt    Time  6    Period  Weeks    Status  Achieved      PT LONG TERM GOAL #5   Title  Patient will report regular participation in exercise program at [facility], at least 1x/week, (he is already a Merchandiser, retail) in aquatic setting or on safe/approriate gym equipment to increase independence with strengthening    Baseline  3/5: intended to start but since  his fall last week they haven't gone    Time  4    Period  Weeks    Status  On-going      PT LONG TERM GOAL #6   Title  Patient will be participating in exercise program at [facility] 2x/week in addition to therapy sessions to demonstrate increased independence and consistency with regular exercise for increased health/wellness.    Baseline  3/5: intended to start but since his fall last  week they haven't gone    Time  7    Period  Weeks    Status  On-going            Plan - 03/01/19 1251    Clinical Impression Statement  Began with gait training this date. Pt much improved from last land-based therapy session as he was able to walk 9f total with 2 seated rest breaks in during, as last time he was unable to walk hardly at all. Continued with sidestepping and eccentric STS for hip strength and balance. PT added eccentric STS to HEP but forgot to give pt the handout. Good portion of the session was spent talking with the pt about his situation following the infection and educating him on how to cope with everything he has going on. Continue as planned, progressing as able.    Rehab Potential  Fair    Clinical Impairments Affecting Rehab Potential  Positive: Highly motivated; Negative: Comorbidities    PT Frequency  3x / week   3x/week for 3 weeks; 2x/week for 4 weeks in march   PT Duration  Other (comment)   7 weeks   PT Treatment/Interventions  ADLs/Self Care Home Management;Aquatic Therapy;DME Instruction;Gait training;Functional mobility training;Stair training;Therapeutic activities;Therapeutic exercise;Balance training;Neuromuscular re-education;Patient/family education;Orthotic Fit/Training;Wheelchair mobility training;Manual techniques;Passive range of motion;Energy conservation;Dry needling;Splinting;Taping    PT Next Visit Plan  continue to steadily add to HEP for pt to have solid routine once ready for d/c. Create aquatic HEP. Continue with gait training and with functional and machine exercises for LE, sidestepping at mat, etc. [facility]. Review importance of strengthening and stretching at home and of stretching for improve posture and gait quality. Use machine-based strengthening to improve patient's independence with using equipment at gym.    PT Home Exercise Plan  08/16/18:  seated  DF/PF, LAQ, marching, abd with theraband and bridge; 11/13: HS and calf stretching (says he has been performing this at home); 01/24/2019 = prone hip flexor stretch; 3/12: eccentric STS    Consulted and Agree with Plan of Care  Patient       Patient will benefit from skilled therapeutic intervention in order to improve the following deficits and impairments:  Abnormal gait, Decreased balance, Decreased endurance, Decreased mobility, Difficulty walking, Hypomobility, Decreased range of motion, Decreased activity tolerance, Decreased strength, Impaired flexibility  Visit Diagnosis: Other abnormalities of gait and mobility  Other symptoms and signs involving the musculoskeletal system  Muscle weakness (generalized)     Problem List Patient Active Problem List   Diagnosis Date Noted  . Pseudogout of knee   . Pyogenic arthritis of right knee joint ([place]   . Falls frequently 05/08/2018  . Fall   . Sleep disturbance   . Post-op pain   . Hypokalemia   . Morbid obesity ([place]   . Postoperative pain   . DVT, lower extremity, distal, acute, bilateral ([place]   . Acute blood loss anemia   . Anemia  of chronic disease   . Essential hypertension   . Bacteremia   . Myelopathy ([place]) 03/30/2018  . Paraplegia ([place])   . Postlaminectomy syndrome, lumbar region   . Epidural abscess   . Abdominal distension   . Encephalopathy   . Weakness of both lower extremities   . Discitis of thoracic region   . Spinal cord stimulator status   . Staphylococcus aureus sepsis ([place]) 03/20/2018  . Bacteremia due to methicillin susceptible Staphylococcus aureus (MSSA) 03/18/2018  . Obesity 03/18/2018  . Nephrolithiasis 03/16/2018  . AKI (acute kidney injury) ([place]) 03/16/2018  . Cognitive impairment 03/16/2018  . Chronic pain syndrome 03/16/2018  . B12 deficiency 01/28/2016  . Memory difficulty 07/23/2015  . History of cerebrovascular disease 07/23/2015  . FH: colon cancer 08/13/2014  . Hx of adenomatous  colonic polyps 08/13/2014  . Chronic back pain 05/30/2012  . CAD, NATIVE VESSEL 07/17/2010  . HYPERCHOLESTEROLEMIA 10/31/2009  . SMOKELESS TOBACCO ABUSE 10/31/2009  . Obstructive sleep apnea 10/31/2009  . Essential hypertension, benign 10/31/2009  . GERD 10/31/2009        Geraldine Solar PT, DPT   [facility] [address] [place], [place], [address] Phone: [phone]   Fax:  [phone]  Name: TRAMAYNE SEBESTA MRN: [id number] Date of Birth: 1946-10-16

## 2019-03-05 ENCOUNTER — Telehealth (HOSPITAL_COMMUNITY): Payer: Self-pay

## 2019-03-05 NOTE — Telephone Encounter (Signed)
I called and spoke with Cody Velez to inform him that aquatic PT was cancelled due to the [facility] closing with ongoing changes to combat COVID-19 nationally. I informed him we have an appointment for PT here at our clinic for a land based session if he is interested in participating in that. He was agreeable and will be at his appointment tomorrow at 1:45 PM.  Kipp Brood, PT, DPT, [facility] Physical Therapist with [facility]  03/05/2019 9:56 AM

## 2019-03-06 ENCOUNTER — Encounter (HOSPITAL_COMMUNITY): Payer: Self-pay

## 2019-03-06 ENCOUNTER — Ambulatory Visit (HOSPITAL_COMMUNITY): Payer: Medicare Other

## 2019-03-06 ENCOUNTER — Other Ambulatory Visit: Payer: Self-pay

## 2019-03-06 DIAGNOSIS — M6281 Muscle weakness (generalized): Secondary | ICD-10-CM | POA: Diagnosis not present

## 2019-03-06 DIAGNOSIS — R29898 Other symptoms and signs involving the musculoskeletal system: Secondary | ICD-10-CM | POA: Diagnosis not present

## 2019-03-06 DIAGNOSIS — R2689 Other abnormalities of gait and mobility: Secondary | ICD-10-CM | POA: Diagnosis not present

## 2019-03-06 NOTE — Therapy (Signed)
[place], [place], [address] Phone: [phone]   Fax:  [phone]  Physical Therapy Treatment  Patient Details  Name: Cody Velez MRN: [id number] Date of Birth: September 28, 1946 Referring Provider (PT): Asencion Noble, MD   Encounter Date: 03/06/2019  PT End of Session - 03/06/19 1543    Visit Number  70    Number of Visits  92    Date for PT Re-Evaluation  03/16/19   corrected date for re-eval to match PT cert; mini reassess completed 02/22/19 visit #67   Authorization Type  Primary: Medicare; Secondary: BC[employer] (70 visits per calendar year)    Authorization Time Period  08/15/18 - 10/14/18; NEW: 10/13/18 to 11/24/18; 11/22/18 - 01/03/19; 01/03/19 to 02/16/19; NEW: 01/24/19-03/16/19    Authorization - Visit Number  25   starting 2020   Authorization - Number of Visits  37    PT Start Time  1346    PT Stop Time  1432    PT Time Calculation (min)  46 min    Equipment Utilized During Treatment  Gait belt    Activity Tolerance  Patient tolerated treatment well    Behavior During Therapy  WFL for tasks assessed/performed       Past Medical History:  Diagnosis Date  . Allergic rhinitis   . Arthritis   . B12 deficiency   . Cervicogenic headache 01/28/2016  . Childhood asthma   . Chronic back pain   . Coronary atherosclerosis of native coronary artery    Mild nonobstructive CAD at catheterization January 2015  . Depression   . Essential hypertension   . Falls   . GERD (gastroesophageal reflux disease)   . History of cerebrovascular disease 07/23/2015  . History of pneumonia 02/2011  . Hyperlipidemia   . Kidney stone   . Memory difficulty 07/23/2015  . OSA (obstructive sleep apnea)    CPAP - Dr. [name]  . Prostate cancer ([place])   . PTSD (post-traumatic stress disorder)    [place]  . Rectal bleeding     Past Surgical History:  Procedure Laterality Date  . APPLICATION OF ROBOTIC ASSISTANCE FOR SPINAL PROCEDURE N/A 03/28/2018    Procedure: APPLICATION OF ROBOTIC ASSISTANCE FOR SPINAL PROCEDURE;  Surgeon: Kristeen Miss, MD;  Location: [facility];  Service: Neurosurgery;  Laterality: N/A;  . BACK SURGERY  02/14/12   lumbar OR #7; "today redid L1L2; replaced screws; added bone from hip"  . BILATERAL KNEE ARTHROSCOPY    . COLONOSCOPY  10/15/2008   Dr. [name]: tubular adenoma   . COLONOSCOPY  12/17/2003   YTK:PTWSFK rectal and colon  . COLONOSCOPY N/A 09/05/2014   Procedure: COLONOSCOPY;  Surgeon: Daneil Dolin, MD;  [facility];  Service: Endoscopy;  Laterality: N/A;  7:30-rescheduled 9/17 to [place] notified pt  . CYSTOSCOPY WITH STENT PLACEMENT Right 01/27/2016   Procedure: CYSTOSCOPY WITH STENT PLACEMENT;  Surgeon: [name], MD;  [facility];  Service: Urology;  Laterality: Right;  . CYSTOSCOPY/RETROGRADE/URETEROSCOPY/STONE EXTRACTION WITH BASKET Right 01/27/2016   Procedure: CYSTOSCOPY, RIGHT RETROGRADE, RIGHT URETEROSCOPY, STONE EXTRACTION ;  Surgeon: [name], MD;  [facility];  Service: Urology;  Laterality: Right;  . ESOPHAGOGASTRODUODENOSCOPY  10/15/2008     Dr Rourk:Schatzki's ring status post dilation and disruption via 63 F Maloney dilator/ otherwise unremarkable esophagus, small hiatal hernia, multiple fundal gland polyps not manipulated, gastritis, negative H.pylori  . ESOPHAGOGASTRODUODENOSCOPY  06/21/02   CLE:XNTZG sliding hiatal hernia with mild  changes of reflux esophagitis limited to gastroesophageal junction.  Noncritical ring at distal esophagus, 3 cm proximal to gastroesophageal junction/Antral gastritis  . [facility] [place]   "broke face playing softball"  . FRACTURE SURGERY     "left wrist; broke it; took spur off"  . HOLMIUM LASER APPLICATION Right 12/25/1094   Procedure: HOLMIUM LASER APPLICATION;  Surgeon: [name], MD;  Location[facility];  Service: Urology;  Laterality: Right;  . KNEE ARTHROSCOPY Right 05/18/2018   Procedure: PARTIAL  MEDIAL MENISECTOMY AND SURGICAL LAVAGE AND CHONDROPLASTY;  Surgeon: [name], MD;  Location: [facility];  Service: Orthopedics;  Laterality: Right;  . LEFT HEART CATHETERIZATION WITH CORONARY ANGIOGRAM N/A 12/27/2013   Procedure: LEFT HEART CATHETERIZATION WITH CORONARY ANGIOGRAM;  Surgeon: [name], MD;  Location: [facility] C[facility];  Service: Cardiovascular;  Laterality: N/A;  . neck epidural    . POSTERIOR LUMBAR FUSION 4 LEVEL N/A 03/28/2018   Procedure: Thoracic eight -Lumbar two- FIXATION WITH SCREW PLACEMENT, DECOMPRESSION Thoracic ten-Thoracic eleven  FOR OSTEOMYELITIS;  Surgeon: Kristeen Miss, MD;  Location: [facility];  Service: Neurosurgery;  Laterality: N/A;  . SHOULDER SURGERY Bilateral   . TEE WITHOUT CARDIOVERSION N/A 03/21/2018   Procedure: TRANSESOPHAGEAL ECHOCARDIOGRAM (TEE) WITH PROPOFOL;  Surgeon: [name], MD;  [facility];  Service: Cardiovascular;  Laterality: N/A;    There were no vitals filed for this visit.  Subjective Assessment - 03/06/19 1415    Subjective  4-5 /1 0 more of an irritation hasnt been adjust stimulator. mainly for neck paina ndheadaches. saw his surgeon, last week, saw the PA - i told them im gonna sue them.     Pertinent History  T10-11 Epidural Abscess, back surgery on 03/28/18, right knee partial medial menisectomy and chondroplasty    Limitations  Standing;Walking;House hold activities;Lifting    Diagnostic tests  CT Lumbar Spine 05/12/18: "Spinal stimulator device, along with multilevel multisegment fusion hardware appear appropriately placed without adverse features."    Patient Stated Goals  To walk    Currently in Pain?  Yes       [place] Adult PT Treatment/Exercise - 03/06/19 0001      Ambulation/Gait   Ambulation/Gait  Yes    Ambulation/Gait Assistance  4: Min guard;4: Min assist    Ambulation/Gait Assistance Details  w/c follow for safety    Ambulation Distance (Feet)  120 Feet   4 bouts (~30' each)   Assistive device  Rolling  walker    Gait Pattern  Decreased dorsiflexion - left;Decreased dorsiflexion - right;Trunk flexed;Decreased step length - right;Decreased step length - left;Step-through pattern    Ambulation Surface  Level;Indoor    Gait Comments  Patient with increased fatigue this session requiring increased rest breaks. Patient also with noted increased Rt knee buckling this session.      Knee/Hip Exercises: Stretches   Passive Hamstring Stretch  Both;3 reps;30 seconds    Passive Hamstring Stretch Limitations  supine with overpressure from therapist      Knee/Hip Exercises: Standing   Other Standing Knee Exercises  side stepping along mat table (8 step Rt/Lt)      Knee/Hip Exercises: Seated   Sit to Sand  2 sets;10 reps;without UE support   elevated mat table on second set       PT Education - 03/06/19 1541    Education Details  Educated on precautions regarding [place] travel precautions for significant other. Discussed that restrictions have not been placed on patient's family  but there are restrictions on seeing patient's who travel to those states and we cannot say clearly if the restrictions will be expanded.     Person(s) Educated  Patient    Methods  Explanation    Comprehension  Verbalized understanding       PT Short Term Goals - 02/22/19 0954      PT SHORT TERM GOAL #1   Title  Patient will report understanding and regular compliance with HEP to improve strength and overall functional mobility.     Baseline  01/24/19 = pt is workign on stretches with assistance from sig other    Time  4    Period  Weeks    Status  Achieved      PT SHORT TERM GOAL #2   Title  Patient will demonstrate improvement of 1/2 MMT strength grade in all muscle groups tested to assist patient with safe transfers, ambulation, and standing.     Baseline  01/24/2019 = Met for all groups with exception of hip abductors bil since evaluation    Time  4    Period  Weeks    Status  Partially Met      PT SHORT TERM  GOAL #3   Title  Patient will report or demonstrate ability to perform ambulation for 2 minutes with LRAD with no more than supervision assistance without rest break and reported no more than minimal difficulty.     Baseline  01/24/19 = amb 4:45 straight with RW and 3 standing rest breaks due to shoulders (amb 2:10 before first standing rest break)    Time  4    Period  Weeks    Status  Achieved      PT SHORT TERM GOAL #4   Title  Patient will demonstrate ability to ambulate 60 feet without rest break using LRAD with no more than minimal assistance.    Time  2    Period  Weeks    Status  Achieved        PT Long Term Goals - 02/22/19 9470      PT LONG TERM GOAL #1   Title  Patient will report or demonstrate ability to perform ambulation for 5 minutes with LRAD with no more than supervision assistance without rest break and reported no more than minimal difficulty.     Baseline  01/24/19 = amb 4:45 straight with RW and 3 standing rest breaks due to shoulders (amb 2:10 before first standing rest break)    Time  6    Period  Weeks    Status  On-going      PT LONG TERM GOAL #2   Title  Patient will report ability to stand at a support surface for 10 minutes without a rest break in order to perform household chores with increased ease.     Baseline  01/24/19 = reports he can do this with support surface    Time  6    Period  Weeks    Status  Achieved      PT LONG TERM GOAL #3   Title  Patient will demonstrate ability to ambulate 120 feet without rest break using LRAD with no more than minimal assistance.    Baseline  01/24/19 = amb 54 ft this date at 2 minutes, and 58 ft at 2:10 seconds before 1st standign rest break, min guard/assist with RW    Time  6    Period  Weeks    Status  On-going  PT LONG TERM GOAL #4   Title  Patient will perform 5x sit to stand from standard chair with good control and single UE assist in 16 seconds or less to demonstrate improved LE strength for functional  transfers.    Baseline  01/24/19 =  from standard chair with UE support and RW 19.8 on first attempt; 14.9 on seconds attempt    Time  6    Period  Weeks    Status  Achieved      PT LONG TERM GOAL #5   Title  Patient will report regular participation in exercise program at [facility], at least 1x/week, (he is already a Merchandiser, retail) in aquatic setting or on safe/approriate gym equipment to increase independence with strengthening    Baseline  3/5: intended to start but since his fall last week they haven't gone    Time  4    Period  Weeks    Status  On-going      PT LONG TERM GOAL #6   Title  Patient will be participating in exercise program at [facility] 2x/week in addition to therapy sessions to demonstrate increased independence and consistency with regular exercise for increased health/wellness.    Baseline  3/5: intended to start but since his fall last week they haven't gone    Time  7    Period  Weeks    Status  On-going        Plan - 03/06/19 1545    Clinical Impression Statement  Session began with gait training using RW. Patient with increased fatigue this session requiring increased rest breaks. Rt knee buckling was evident throughout gait and again with lateral stepping along mat table. He ambulated ~ 120'. Sit to stand strengthening was continued with no UE support to initiate standing. Patient had difficulty with last 4 reps of each set due to fatigue and required UE assistance to complete stand. He will continue to benefit from skilled PT interventions to address impairments and improve mobility.    Rehab Potential  Fair    Clinical Impairments Affecting Rehab Potential  Positive: Highly motivated; Negative: Comorbidities    PT Frequency  3x / week   3x/week for 3 weeks; 2x/week for 4 weeks in march   PT Duration  Other (comment)   7 weeks   PT Treatment/Interventions  ADLs/Self Care Home Management;Aquatic Therapy;DME Instruction;Gait training;Functional mobility training;Stair  training;Therapeutic activities;Therapeutic exercise;Balance training;Neuromuscular re-education;Patient/family education;Orthotic Fit/Training;Wheelchair mobility training;Manual techniques;Passive range of motion;Energy conservation;Dry needling;Splinting;Taping    PT Next Visit Plan  Follow up on letter therapist needs to write for [employer] research and PT participation. continue to steadily add to HEP for pt to have solid routine once ready for d/c. Create aquatic HEP. Continue with gait training and with functional and machine exercises for LE, sidestepping at mat, etc. Continue to encourage to participate in exercise at [facility] and to use NuStep machine there. Review importance of strengthening and stretching at home and of stretching for improve posture and gait quality. Use machine-based strengthening to improve patient's independence with using equipment at gym.    PT Home Exercise Plan  08/16/18:  seated DF/PF, LAQ, marching, abd with theraband and bridge; 11/13: HS and calf stretching (says he has been performing this at home); 01/24/2019 = prone hip flexor stretch; 3/12: eccentric STS    Consulted and Agree with Plan of Care  Patient       Patient will benefit from skilled therapeutic intervention in order to improve the following deficits  and impairments:  Abnormal gait, Decreased balance, Decreased endurance, Decreased mobility, Difficulty walking, Hypomobility, Decreased range of motion, Decreased activity tolerance, Decreased strength, Impaired flexibility  Visit Diagnosis: Other abnormalities of gait and mobility  Other symptoms and signs involving the musculoskeletal system  Muscle weakness (generalized)     Problem List Patient Active Problem List   Diagnosis Date Noted  . Pseudogout of knee   . Pyogenic arthritis of right knee joint ([place])   . Falls frequently 05/08/2018  . Fall   . Sleep disturbance   . Post-op pain   . Hypokalemia   . Morbid obesity ([place])   . Postoperative pain    . DVT, lower extremity, distal, acute, bilateral ([place])   . Acute blood loss anemia   . Anemia of chronic disease   . Essential hypertension   . Bacteremia   . Myelopathy ([place]) 03/30/2018  . Paraplegia ([place])   . Postlaminectomy syndrome, lumbar region   . Epidural abscess   . Abdominal distension   . Encephalopathy   . Weakness of both lower extremities   . Discitis of thoracic region   . Spinal cord stimulator status   . Staphylococcus aureus sepsis ([place]) 03/20/2018  . Bacteremia due to methicillin susceptible Staphylococcus aureus (MSSA) 03/18/2018  . Obesity 03/18/2018  . Nephrolithiasis 03/16/2018  . AKI (acute kidney injury) ([place]) 03/16/2018  . Cognitive impairment 03/16/2018  . Chronic pain syndrome 03/16/2018  . B12 deficiency 01/28/2016  . Memory difficulty 07/23/2015  . History of cerebrovascular disease 07/23/2015  . FH: colon cancer 08/13/2014  . Hx of adenomatous colonic polyps 08/13/2014  . Chronic back pain 05/30/2012  . CAD, NATIVE VESSEL 07/17/2010  . HYPERCHOLESTEROLEMIA 10/31/2009  . SMOKELESS TOBACCO ABUSE 10/31/2009  . Obstructive sleep apnea 10/31/2009  . Essential hypertension, benign 10/31/2009  . GERD 10/31/2009    Kipp Brood, PT, DPT, [facility] Physical Therapist with [facility]  03/06/2019 3:46 PM    [facility] [address] [place], [place], [address] Phone: [phone]   Fax:  [phone]  Name: Cody Velez MRN: [id number] Date of Birth: March 25, 1946

## 2019-03-08 ENCOUNTER — Ambulatory Visit (HOSPITAL_COMMUNITY): Payer: Medicare Other

## 2019-03-09 ENCOUNTER — Telehealth (HOSPITAL_COMMUNITY): Payer: Self-pay

## 2019-03-09 NOTE — Telephone Encounter (Signed)
I called Cody Velez and informed him that our office is closing for 2 weeks for the safety and well being of all patients and staff. I offered him to reschedule appointments now for after April 6th our tentative re-open date or to place him on a call list. He asked that we re-schedule and I added 2 appointments (03/27/19 at 10:30am; and 03/29/19 at 9:00am). He expressed interest in telehealth if it becomes available and in a weekly check in call. He feels comfortable with the HEP he has available at this time.   I also discussed with him information on the [place] and that we do not need to send a letter at this time for justification of treatment as he is still covered under medicare. I also informed him there are adaptive driving resources with the [place] in [place], [place] and [place], [place] that he can look into setting up therapy services. He asked that if I receive the information from the [place] to email it to him at [email or web address].   Kipp Brood, PT, DPT, [facility] Physical Therapist with [facility]  03/09/2019 11:03 AM

## 2019-03-13 ENCOUNTER — Ambulatory Visit (HOSPITAL_COMMUNITY): Payer: Medicare Other

## 2019-03-14 ENCOUNTER — Ambulatory Visit (HOSPITAL_COMMUNITY): Payer: Medicare Other

## 2019-03-15 ENCOUNTER — Ambulatory Visit (HOSPITAL_COMMUNITY): Payer: Medicare Other

## 2019-03-16 ENCOUNTER — Telehealth (HOSPITAL_COMMUNITY): Payer: Self-pay | Admitting: Physical Therapy

## 2019-03-16 NOTE — Telephone Encounter (Signed)
Therapist called to check in on patient. Patient reported he was doing well and understood all of his HEP. Therapist reminded him of when his next scheduled appointments are.   Clarene Critchley PT, DPT 1:08 PM, 03/16/19 [phone]

## 2019-03-20 ENCOUNTER — Ambulatory Visit (HOSPITAL_COMMUNITY): Payer: Medicare Other

## 2019-03-21 ENCOUNTER — Telehealth (HOSPITAL_COMMUNITY): Payer: Self-pay

## 2019-03-21 NOTE — Telephone Encounter (Signed)
Spoke to pt regarding cancelling his appointments until further notice due to COVID-19 in order to ensure pt and staff safety during this time. Pt not interested in weekly phone calls or updated HEP at this time; he was educated that he could call the clinic with any questions or concerns and he verbalized understanding. Told pt that we would call him to reschedule once our clinic reopens and he verbalized understanding.  Geraldine Solar PT, DPT

## 2019-03-22 ENCOUNTER — Encounter (HOSPITAL_COMMUNITY): Payer: Medicare Other

## 2019-03-22 ENCOUNTER — Ambulatory Visit: Payer: Medicare Other | Admitting: Internal Medicine

## 2019-03-27 ENCOUNTER — Encounter (HOSPITAL_COMMUNITY): Payer: Self-pay

## 2019-03-29 ENCOUNTER — Encounter (HOSPITAL_COMMUNITY): Payer: Self-pay

## 2019-04-19 ENCOUNTER — Ambulatory Visit: Payer: Medicare Other | Admitting: Internal Medicine

## 2019-04-25 ENCOUNTER — Telehealth (HOSPITAL_COMMUNITY): Payer: Self-pay

## 2019-04-25 NOTE — Telephone Encounter (Signed)
I spoke with [name] about our clinic's current schedule and plan to officially re-open on June 1st. I informed his that after speaking with my supervisor we feel it would be appropriate for him to return to clinic for 1x/week visits starting next week as all OP Rehab offices are beginning to ramp up patient caseload. He was eager to return to therapy and we scheduled his appointments for Wednesday 5/13, 5/20, 5/27.   Kipp Brood, PT, DPT, [facility] Physical Therapist with [facility]  04/25/2019 4:35 PM

## 2019-05-02 ENCOUNTER — Ambulatory Visit (HOSPITAL_COMMUNITY): Payer: Medicare Other | Attending: Internal Medicine

## 2019-05-02 ENCOUNTER — Encounter (HOSPITAL_COMMUNITY): Payer: Self-pay

## 2019-05-02 ENCOUNTER — Other Ambulatory Visit: Payer: Self-pay

## 2019-05-02 DIAGNOSIS — R29898 Other symptoms and signs involving the musculoskeletal system: Secondary | ICD-10-CM | POA: Diagnosis not present

## 2019-05-02 DIAGNOSIS — M6281 Muscle weakness (generalized): Secondary | ICD-10-CM

## 2019-05-02 DIAGNOSIS — R2689 Other abnormalities of gait and mobility: Secondary | ICD-10-CM | POA: Diagnosis not present

## 2019-05-02 NOTE — Therapy (Signed)
[place], [place], [address] Phone: [phone]   Fax:  [phone]  Physical Therapy Re-Evaluation  Patient Details  Name: Cody Velez MRN: [id number] Date of Birth: [birth year] Referring Provider (PT): Asencion Noble, MD   Encounter Date: 05/02/2019    PT Visits / Re-Eval - 05/02/19 1159  Visit Number 1  Number of Visits 9  Date for PT Re-Evaluation 06/13/19  Authorization  Authorization Type Primary: Medicare; Secondary: BCBS (25 visits per calendar year)  Authorization Time Period 08/15/18 - 10/14/18; 10/13/18 to 11/24/18; 11/22/18 - 01/03/19; 01/03/19 to 02/16/19; 01/24/19-03/16/19; NEW: 05/02/19-06/13/19  Authorization - Visit Number 45 (starting 2020)  Authorization - Number of Visits 75  PT Time Calculation  PT Start Time 1116  PT Stop Time 1206  PT Time Calculation (min) 50 min  PT - End of Session  Equipment Utilized During Treatment Gait belt  Activity Tolerance Patient tolerated treatment well  Behavior During Therapy [facility] for tasks assessed/performed     Past Medical History:  Diagnosis Date   Allergic rhinitis    Arthritis    B12 deficiency    Cervicogenic headache 01/28/2016   Childhood asthma    Chronic back pain    Coronary atherosclerosis of native coronary artery    Mild nonobstructive CAD at catheterization January 2015   Depression    Essential hypertension    Falls    GERD (gastroesophageal reflux disease)    History of cerebrovascular disease 07/23/2015   History of pneumonia 02/2011   Hyperlipidemia    Kidney stone    Memory difficulty 07/23/2015   OSA (obstructive sleep apnea)    CPAP - Dr. [name]   Prostate cancer [facility])    PTSD (post-traumatic stress disorder)    [place]   Rectal bleeding     Past Surgical History:  Procedure Laterality Date   APPLICATION OF ROBOTIC ASSISTANCE FOR SPINAL PROCEDURE N/A 03/28/2018   Procedure: APPLICATION OF ROBOTIC ASSISTANCE  FOR SPINAL PROCEDURE;  Surgeon: Kristeen Miss, MD;  Location: [facility];  Service: Neurosurgery;  Laterality: N/A;   BACK SURGERY  02/14/12   lumbar OR #7; "today redid L1L2; replaced screws; added bone from hip"   BILATERAL KNEE ARTHROSCOPY     COLONOSCOPY  10/15/2008   Dr. [name]: tubular adenoma    COLONOSCOPY  12/17/2003   VEH:[facility]NOBS rectal and colon   COLONOSCOPY N/A 09/05/2014   Procedure: COLONOSCOPY;  Surgeon: Daneil Dolin, MD;  [facility];  Service: Endoscopy;  Laterality: N/A;  7:30-rescheduled 9/17 to [place] notified pt   CYSTOSCOPY WITH STENT PLACEMENT Right 01/27/2016   Procedure: CYSTOSCOPY WITH STENT PLACEMENT;  Surgeon: [name], MD;  Location: [facility];  Service: Urology;  Laterality: Right;   CYSTOSCOPY/RETROGRADE/URETEROSCOPY/STONE EXTRACTION WITH BASKET Right 01/27/2016   Procedure: CYSTOSCOPY, RIGHT RETROGRADE, RIGHT URETEROSCOPY, STONE EXTRACTION ;  Surgeon: [name], MD;  Location: [facility];  Service: Urology;  Laterality: Right;   ESOPHAGOGASTRODUODENOSCOPY  10/15/2008     Dr Rourk:Schatzki's ring status post dilation and disruption via [address] F [employer] dilator/ otherwise unremarkable esophagus, small hiatal hernia, multiple fundal gland polyps not manipulated, gastritis, negative H.pylori   ESOPHAGOGASTRODUODENOSCOPY  06/21/02   JGG:EZMOQ sliding hiatal hernia with mild changes of reflux esophagitis limited to gastroesophageal junction.  Noncritical ring at distal esophagus, 3 cm proximal to gastroesophageal junction/Antral gastritis   FACIAL COSMETIC SURGERY  ` 1985   "broke face playing softball"   FRACTURE SURGERY     "  left wrist; broke it; took spur off"   HOLMIUM LASER APPLICATION Right 05/26/1244   Procedure: HOLMIUM LASER APPLICATION;  Surgeon: [name], MD;  Location[facility];  Service: Urology;  Laterality: Right;   KNEE ARTHROSCOPY Right 05/18/2018   Procedure: PARTIAL MEDIAL MENISECTOMY AND SURGICAL LAVAGE AND  CHONDROPLASTY;  Surgeon: [name], MD;  Location: [facility];  Service: Orthopedics;  Laterality: Right;   LEFT HEART CATHETERIZATION WITH CORONARY ANGIOGRAM N/A 12/27/2013   Procedure: LEFT HEART CATHETERIZATION WITH CORONARY ANGIOGRAM;  Surgeon: [name], MD;  Location: [facility] CATH LAB;  Service: Cardiovascular;  Laterality: N/A;   neck epidural     POSTERIOR LUMBAR FUSION 4 LEVEL N/A 03/28/2018   Procedure: Thoracic eight -Lumbar two- FIXATION WITH SCREW PLACEMENT, DECOMPRESSION Thoracic ten-Thoracic eleven  FOR OSTEOMYELITIS;  Surgeon: Kristeen Miss, MD;  Location: [facility];  Service: Neurosurgery;  Laterality: N/A;   SHOULDER SURGERY Bilateral    TEE WITHOUT CARDIOVERSION N/A 03/21/2018   Procedure: TRANSESOPHAGEAL ECHOCARDIOGRAM (TEE) WITH PROPOFOL;  Surgeon: [name], MD;  [facility];  Service: Cardiovascular;  Laterality: N/A;    There were no vitals filed for this visit.   Subjective Assessment - 05/02/19 1118   Subjective Patient reports he has been doing some stretching at home and is working on sit to stands every day since stopping therapy. He states he has been trying to walk with a walker at home for short distances of 20-40 feet. He reports since stopping therapy due to office closure for COVID-19 he has had 1 fall in the bathroom. He states he had been standing in the bathroom and his legs became tired so he squatted down to the ground. He reports he was able to get up from his hands and knees to get back into his chair. He states he spoke with the person in charge of the [place] study for exoskeleton use for patients with SCIs and he was told he is a great candidate however he needs to lose at least 50lbs to be able to use the device. He is also waiting to hear from the [place] on renovating his bathroom to make it more accessible.  Pertinent History T10-11 Epidural Abscess, back surgery on 03/28/18, right knee partial medial menisectomy and chondroplasty  Limitations  Standing;Walking;House hold activities;Lifting  Diagnostic tests CT Lumbar Spine 05/12/18: "Spinal stimulator device, along with multilevel multisegment fusion hardware appear appropriately placed without adverse features."  Patient Stated Goals To walk  Pain Assessment  Currently in Pain? No/denies     [facility] PT Assessment - 05/02/19 0001   Medical Diagnosis T10-11 Epidural Abscess "Paraplegia"  Referring Provider (PT) Asencion Noble, MD  Onset Date/Surgical Date 03/28/18  Next MD Visit He has appts with Willey Blade, Dr. Ellene Route, and Dr. Bridget Hartshorn coming up in the next month. [facility] this past [facility] and reports that was good.   Prior Therapy Yes, inpatient rehab and home health which patient reported ended 08/04/18  Precautions  Precautions Fall  Restrictions  Weight Bearing Restrictions No  Balance Screen  Has the patient fallen in the past 6 months Yes  How many times? 2  Has the patient had a decrease in activity level because of a fear of falling?  Yes  Is the patient reluctant to leave their home because of a fear of falling?  No  Home Environment  Living Environment Private residence  Living Arrangements Spouse/significant other  Type of [place]  Access Ramped entrance  Home Layout Able to live on main level with bedroom/bathroom  [place] - 2 wheels;Walker - 4 wheels;Wheelchair - manual;BSC;Electric scooter  Prior Function  Level of Independence Needs assistance with ADLs;Independent with household mobility with device;Independent with community mobility with device  Vocation Retired  Associate Professor  Overall Cognitive Status Within Functional Limits for tasks assessed  Posture/Postural Control  Posture/Postural Control Postural limitations  Postural Limitations Rounded Shoulders;Forward head;Increased thoracic kyphosis;Decreased lumbar lordosis  Strength  Left Knee Flexion 4-/5 (was 4+)  Left Knee Extension 4/5 (5)  Right Hip  Flexion 4/5 (was 5)  Right Hip Extension 2+/5 (was 3-)  Right Hip ABduction 3-/5 (was 3-)  Left Hip Flexion 4-/5 (was 5)  Left Hip Extension 2+/5 (was 3-)  Left Hip ABduction 3-/5 (was 3-)  Right Knee Flexion 4+/5 (was 4+)  Right Knee Extension 5/5  Right Ankle Dorsiflexion 5/5 (5)  Left Ankle Dorsiflexion 4/5 (4+)  Flexibility  Soft Tissue Assessment /Muscle Length y (pt with tight hip flexors and adductors)  Bed Mobility  Rolling Right Independent  Rolling Left Independent  Supine to Sit Independent  Sit to Supine Independent  Transfers  Sit to Stand 5: Supervision  Ambulation/Gait  Ambulation/Gait Yes  Ambulation/Gait Assistance 4: Min guard  Ambulation/Gait Assistance Details w/c follow for safety  Ambulation Distance (Feet) 80 Feet (limited due to R knee pain and buckling)  Assistive device Rolling walker  Gait Pattern Decreased dorsiflexion - left;Decreased dorsiflexion - right;Trunk flexed;Decreased step length - right;Decreased step length - left;Step-through pattern  Ambulation Surface Level;Indoor  Gait Comments Patient with UE fatigue from reliance on UE support with RW for gait. Patient able to take standing rest break at 72' and ambulate another 31' before seated rest break.     PT Education - 05/02/19 1140  Education Details Educated on plan for continuation of therapy. Educated on plan to update goals for greater endurance and to increase activity for weight loss program.  Person(s) Educated Patient  Methods Explanation  Comprehension Verbalized understanding     PT Short Term Goals - 05/02/19 1245      PT SHORT TERM GOAL #1   Title  Patient will participate in HEP daily and will be able to instruct therapist in exercises for stretching and strengthening to improve mobility.    Time  3    Period  Weeks    Status  New    Target Date  05/23/19      PT SHORT TERM GOAL #2   Title  Patient will report or demonstrate ability to perform ambulation  for 5 minutes with rollator with no more than supervision assistance without rest break and reported no more than minimal difficulty.     Time  3    Period  Weeks    Status  New        PT Long Term Goals - 05/02/19 1250      PT LONG TERM GOAL #1   Title  Patient will report or demonstrate ability to perform ambulation for 8 minutes with LRAD with no more than supervision assistance without rest break and reported no more than minimal difficulty.     Time  6    Period  Weeks    Status  New    Target Date  06/13/19      PT LONG TERM GOAL #2   Title  Patient will demonstrate ability to stand at a support surface for 10 minutes without a rest  break in order to perform household chores and ADL's with increased ease.     Time  6    Period  Weeks    Status  New      PT LONG TERM GOAL #3   Title  Patient will demonstrate ability to ambulate 120 feet without rest break using LRAD with no more than minimal assistance.    Time  6    Period  Weeks    Status  New      PT LONG TERM GOAL #4   Title  Patient will be participating in exercise program at home or [employer], 40-60 minutes of moderate aerobic exercise in addition to therapy, to improve health/wellness in order to lose weight for improved mobility and function.    Time  6    Period  Weeks    Status  New      PT LONG TERM GOAL #5   Title  Patient will demonstrate safe transfer to/from arm bike at home to be able to safely exercise for weight loss program to improve health/wellness and increase physical activity at home.    Time  6    Period  Weeks    Status  New          Plan - 05/02/19 1257  Clinical Impression Statement Patient re-assessed this session after ~ 40-month break in therapy due to office closure for COVID-19. Patient has maintained some strength with functional exercises at home however objective testing reveals decrease in hip strength bil. He ambulated ~ 80 today and required 1 standing rest break due to UE  fatigue similarly to his last re-assessment date on 01/24/19. He has been attempting to use a rollator at home and will benefit from education on safe use of rollator for gait and transfers as he reports it does not feel quite as stable as RW. He also has a bike that he may benefit from using to increase activity for weight loss to improve mobility. He will benefit from transfer training to be able to safely use the bike. Pt needs continued skilled therapy to progress safe HEP that includes aerobic exercise for weight loss and to improve endurance with mobility.  Pt will benefit from skilled therapeutic intervention in order to improve on the following deficits Abnormal gait;Decreased balance;Decreased endurance;Decreased mobility;Difficulty walking;Hypomobility;Decreased range of motion;Decreased activity tolerance;Decreased strength;Impaired flexibility  Rehab Potential Fair  Clinical Impairments Affecting Rehab Potential Positive: Highly motivated; Negative: Comorbidities  PT Frequency 2x / week (start at 1x/week and may increase to 2 during last 3 weeks)  PT Duration 6 weeks  PT Treatment/Interventions ADLs/Self Care Home Management;Aquatic Therapy;DME Instruction;Gait training;Functional mobility training;Stair training;Therapeutic activities;Therapeutic exercise;Balance training;Neuromuscular re-education;Patient/family education;Orthotic Fit/Training;Wheelchair mobility training;Manual techniques;Passive range of motion;Energy conservation;Dry needling;Splinting;Taping  PT Next Visit Plan Educate on importance of regular moderate aerobic exercise to improve health and promote weight loss. Perform transfer training for patient to use bike at home safely. Review importance of strengthening and stretching at home and of stretching for improve posture and gait quality. Use machine-based strengthening to improve patient's independence with using equipment at gym. Continue with gait training and with  functional and machine exercises for LE, sidestepping at mat, etc.  PT Home Exercise Plan 08/16/18:  seated DF/PF, LAQ, marching, abd with theraband and bridge; 11/13: HS and calf stretching (says he has been performing this at home); 01/24/2019 = prone hip flexor stretch; 3/12: eccentric STS  Consulted and Agree with Plan of Care Patient  Patient will benefit from skilled therapeutic intervention in order to improve the following deficits and impairments:  Abnormal gait, Decreased balance, Decreased endurance, Decreased mobility, Difficulty walking, Hypomobility, Decreased range of motion, Decreased activity tolerance, Decreased strength, Impaired flexibility  Visit Diagnosis: Other abnormalities of gait and mobility  Other symptoms and signs involving the musculoskeletal system  Muscle weakness (generalized)     Problem List Patient Active Problem List   Diagnosis Date Noted   Pseudogout of knee    Pyogenic arthritis of right knee joint ([place])    Falls frequently 05/08/2018   Fall    Sleep disturbance    Post-op pain    Hypokalemia    Morbid obesity ([place])    Postoperative pain    DVT, lower extremity, distal, acute, bilateral ([place])    Acute blood loss anemia    Anemia of chronic disease    Essential hypertension    Bacteremia    Myelopathy ([place]) 03/30/2018   Paraplegia ([place])    Postlaminectomy syndrome, lumbar region    Epidural abscess    Abdominal distension    Encephalopathy    Weakness of both lower extremities    Discitis of thoracic region    Spinal cord stimulator status    Staphylococcus aureus sepsis ([place]) 03/20/2018   Bacteremia due to methicillin susceptible Staphylococcus aureus (MSSA) 03/18/2018   Obesity 03/18/2018   Nephrolithiasis 03/16/2018   AKI (acute kidney injury) ([place]) 03/16/2018   Cognitive impairment 03/16/2018   Chronic pain syndrome 03/16/2018   B12 deficiency 01/28/2016   Memory difficulty 07/23/2015    History of cerebrovascular disease 07/23/2015   FH: colon cancer 08/13/2014   Hx of adenomatous colonic polyps 08/13/2014   Chronic back pain 05/30/2012   CAD, NATIVE VESSEL 07/17/2010   HYPERCHOLESTEROLEMIA 10/31/2009   SMOKELESS TOBACCO ABUSE 10/31/2009   Obstructive sleep apnea 10/31/2009   Essential hypertension, benign 10/31/2009   GERD 10/31/2009    Kipp Brood, PT, DPT, [facility] Physical Therapist with [facility]  05/02/2019, 11:59 AM  [facility] [address] [place], [place], [address] Phone: [phone]   Fax:  [phone]  Name: MANNY VITOLO MRN: [id number] Date of Birth: 12-30-1945

## 2019-05-02 NOTE — Patient Instructions (Signed)
Prone Quadriceps Stretch with Strap reps: 3 sets: 1 hold: 30 seconds daily: 1-2  weekly: 7      Exercise image step 1   Exercise image step 2  Setup  Begin lying on your front with your legs straight, holding the end of a strap that is looped around one foot.  Movement  Pull the end of the strap over your shoulder on the same side of your body, bending your knee, until you feel a gentle stretch in your thigh.  Tip  Do not let your low back arch during the stretch.

## 2019-05-09 ENCOUNTER — Ambulatory Visit (HOSPITAL_COMMUNITY): Payer: Medicare Other

## 2019-05-09 ENCOUNTER — Other Ambulatory Visit: Payer: Self-pay

## 2019-05-09 ENCOUNTER — Encounter (HOSPITAL_COMMUNITY): Payer: Self-pay

## 2019-05-09 DIAGNOSIS — R29898 Other symptoms and signs involving the musculoskeletal system: Secondary | ICD-10-CM | POA: Diagnosis not present

## 2019-05-09 DIAGNOSIS — M6281 Muscle weakness (generalized): Secondary | ICD-10-CM | POA: Diagnosis not present

## 2019-05-09 DIAGNOSIS — R2689 Other abnormalities of gait and mobility: Secondary | ICD-10-CM

## 2019-05-09 NOTE — Therapy (Signed)
[place], [place], [address] Phone: [phone]   Fax:  [phone]  Physical Therapy Treatment  Patient Details  Name: Cody Velez MRN: [id number] Date of Birth: [birth year] Referring Provider (PT): Asencion Noble, MD   Encounter Date: 05/09/2019  PT End of Session - 05/09/19 1142    Visit Number  2    Number of Visits  9    Date for PT Re-Evaluation  06/13/19    Authorization Type  Primary: Medicare; Secondary: [employer] (75 visits per calendar year)    Authorization Time Period  08/15/18 - 10/14/18; 10/13/18 to 11/24/18; 11/22/18 - 01/03/19; 01/03/19 to 02/16/19; 01/24/19-03/16/19; NEW: 05/02/19-06/13/19    Authorization - Visit Number  26   starting 2020   Authorization - Number of Visits  40    PT Start Time  1115    PT Stop Time  1200    PT Time Calculation (min)  45 min    Equipment Utilized During Treatment  Gait belt    Activity Tolerance  Patient tolerated treatment well    Behavior During Therapy  WFL for tasks assessed/performed       Past Medical History:  Diagnosis Date  . Allergic rhinitis   . Arthritis   . B12 deficiency   . Cervicogenic headache 01/28/2016  . Childhood asthma   . Chronic back pain   . Coronary atherosclerosis of native coronary artery    Mild nonobstructive CAD at catheterization January 2015  . Depression   . Essential hypertension   . Falls   . GERD (gastroesophageal reflux disease)   . History of cerebrovascular disease 07/23/2015  . History of pneumonia 02/2011  . Hyperlipidemia   . Kidney stone   . Memory difficulty 07/23/2015  . OSA (obstructive sleep apnea)    CPAP - Dr. [name]  . Prostate cancer ([place])   . PTSD (post-traumatic stress disorder)    [place]  . Rectal bleeding     Past Surgical History:  Procedure Laterality Date  . APPLICATION OF ROBOTIC ASSISTANCE FOR SPINAL PROCEDURE N/A 03/28/2018   Procedure: APPLICATION OF ROBOTIC ASSISTANCE FOR SPINAL PROCEDURE;  Surgeon:  Kristeen Miss, MD;  Location: [facility];  Service: Neurosurgery;  Laterality: N/A;  . BACK SURGERY  02/14/12   lumbar OR #7; "today redid L1L2; replaced screws; added bone from hip"  . BILATERAL KNEE ARTHROSCOPY    . COLONOSCOPY  10/15/2008   Dr. [name]: tubular adenoma   . COLONOSCOPY  12/17/2003   UXN:ATFTDD rectal and colon  . COLONOSCOPY N/A 09/05/2014   Procedure: COLONOSCOPY;  Surgeon: Daneil Dolin, MD;  [facility];  Service: Endoscopy;  Laterality: N/A;  7:30-rescheduled 9/17 to [place] notified pt  . CYSTOSCOPY WITH STENT PLACEMENT Right 01/27/2016   Procedure: CYSTOSCOPY WITH STENT PLACEMENT;  Surgeon: [name], MD;  Location: [facility];  Service: Urology;  Laterality: Right;  . CYSTOSCOPY/RETROGRADE/URETEROSCOPY/STONE EXTRACTION WITH BASKET Right 01/27/2016   Procedure: CYSTOSCOPY, RIGHT RETROGRADE, RIGHT URETEROSCOPY, STONE EXTRACTION ;  Surgeon: [name], MD;  Location: [facility];  Service: Urology;  Laterality: Right;  . ESOPHAGOGASTRODUODENOSCOPY  10/15/2008     Dr Rourk:Schatzki's ring status post dilation and disruption via 13 F Maloney dilator/ otherwise unremarkable esophagus, small hiatal hernia, multiple fundal gland polyps not manipulated, gastritis, negative H.pylori  . ESOPHAGOGASTRODUODENOSCOPY  06/21/02   [facility]:URKYH sliding hiatal hernia with mild changes of reflux esophagitis limited to gastroesophageal junction.  Noncritical ring at distal esophagus, 3  cm proximal to gastroesophageal junction/Antral gastritis  . [name]   "broke face playing softball"  . FRACTURE SURGERY     "left wrist; broke it; took spur off"  . HOLMIUM LASER APPLICATION Right 05/27/1274   Procedure: HOLMIUM LASER APPLICATION;  Surgeon: [name], MD;  Location: [facility];  Service: Urology;  Laterality: Right;  . KNEE ARTHROSCOPY Right 05/18/2018   Procedure: PARTIAL MEDIAL MENISECTOMY AND SURGICAL LAVAGE AND CHONDROPLASTY;  Surgeon: [name], MD;  Location: [facility];  Service: Orthopedics;  Laterality: Right;  . LEFT HEART CATHETERIZATION WITH CORONARY ANGIOGRAM N/A 12/27/2013   Procedure: LEFT HEART CATHETERIZATION WITH CORONARY ANGIOGRAM;  Surgeon: [name], MD;  Location[facility];  Service: Cardiovascular;  Laterality: N/A;  . neck epidural    . POSTERIOR LUMBAR FUSION 4 LEVEL N/A 03/28/2018   Procedure: Thoracic eight -Lumbar two- FIXATION WITH SCREW PLACEMENT, DECOMPRESSION Thoracic ten-Thoracic eleven  FOR OSTEOMYELITIS;  Surgeon: Kristeen Miss, MD;  Location: [facility];  Service: Neurosurgery;  Laterality: N/A;  . SHOULDER SURGERY Bilateral   . TEE WITHOUT CARDIOVERSION N/A 03/21/2018   Procedure: TRANSESOPHAGEAL ECHOCARDIOGRAM (TEE) WITH PROPOFOL;  Surgeon: [name], MD;  [facility];  Service: Cardiovascular;  Laterality: N/A;    There were no vitals filed for this visit.  Subjective Assessment - 05/09/19 1118    Subjective  Patient reports his Rt shoulder has been bothering him recently and he believes it is related to how he has been sleepign on his Rt side and arm. He denies any other pain today and reports he is just feeling a little tires. He admits he has not tried to do his exercises since last week.    Pertinent History  T10-11 Epidural Abscess, back surgery on 03/28/18, right knee partial medial menisectomy and chondroplasty    Limitations  Standing;Walking;House hold activities;Lifting    Diagnostic tests  CT Lumbar Spine 05/12/18: "Spinal stimulator device, along with multilevel multisegment fusion hardware appear appropriately placed without adverse features."    Patient Stated Goals  To walk    Currently in Pain?  Yes    Pain Score  3     Pain Location  Shoulder    Pain Orientation  Right    Pain Descriptors / Indicators  Aching    Pain Type  Acute pain    Pain Onset  In the past 7 days    Pain Frequency  Intermittent    Aggravating Factors   pt thinks it is from how he sleeps     Pain Relieving Factors  unsure    Effect of Pain on Daily Activities  not limited        [facility] Adult PT Treatment/Exercise - 05/09/19 0001      Knee/Hip Exercises: Aerobic   Nustep  10 minutes on hill profile 3 (level 4) 1 min fast/1 min easy      Knee/Hip Exercises: Standing   Forward Step Up  Both;2 sets;10 reps;Step Height: 2";Step Height: 4"    Forward Step Up Limitations  1st set 2" step, 2nd set 4" step ( min assist for LE placement to position feet on step and prevent scissoring gait    Gait Training  Fwd/Bkwd gait in // bars, 5 xRT with min assist for LE placement.    Other Standing Knee Exercises  Lateral stepping in// bars, min assist for         PT Education - 05/09/19 1141  Education Details  Educated patient on additional exercises for HEP and on importance of performing them daily to improve strength and flexibility for more normalized walking.    Person(s) Educated  Patient    Methods  Explanation;Handout    Comprehension  Verbalized understanding       PT Short Term Goals - 05/02/19 1245      PT SHORT TERM GOAL #1   Title  Patient will participate in HEP daily and will be able to instruct therapist in exercises for stretching and strengthening to improve mobility.    Time  3    Period  Weeks    Status  New    Target Date  05/23/19      PT SHORT TERM GOAL #2   Title  Patient will report or demonstrate ability to perform ambulation for 5 minutes with rollator with no more than supervision assistance without rest break and reported no more than minimal difficulty.     Time  3    Period  Weeks    Status  New        PT Long Term Goals - 05/02/19 1250      PT LONG TERM GOAL #1   Title  Patient will report or demonstrate ability to perform ambulation for 8 minutes with LRAD with no more than supervision assistance without rest break and reported no more than minimal difficulty.     Time  6    Period  Weeks    Status  New    Target Date  06/13/19       PT LONG TERM GOAL #2   Title  Patient will demonstrate ability to stand at a support surface for 10 minutes without a rest break in order to perform household chores and ADL's with increased ease.     Time  6    Period  Weeks    Status  New      PT LONG TERM GOAL #3   Title  Patient will demonstrate ability to ambulate 120 feet without rest break using LRAD with no more than minimal assistance.    Time  6    Period  Weeks    Status  New      PT LONG TERM GOAL #4   Title  Patient will be participating in exercise program at home or [employer], 40-60 minutes of moderate aerobic exercise in addition to therapy, to improve health/wellness in order to lose weight for improved mobility and function.    Time  6    Period  Weeks    Status  New      PT LONG TERM GOAL #5   Title  Patient will demonstrate safe transfer to/from arm bike at home to be able to safely exercise for weight loss program to improve health/wellness and increase physical activity at home.    Time  6    Period  Weeks    Status  New         Plan - 05/09/19 1142    Clinical Impression Statement  Therapy session initiated with standing exercises and gait training in // bars for bil UE support. Pt requires min assist with bil LE stepping for retro gait and cues to maintain upright posture. He required intermittent rest breaks throughout due to LE fatigue. Pt has poor control and demonstrates dysmetria with lateral stepping requiring assist to prevent scissoring and NBOS. He performed step ups today initially on 2" step and progressed to 4" step  to increase difficulty. Pt required assist with step back but was able to coordination foot placement on step independently. At [facility] pt performed NuStep for 10 minutes for aerobic exercise. He performed 1 min fast/1 min easy on hill program. Patient able to maintain >100 spm for fast interval on level 4 resistance. He was provided additional exercises for HEP for LE flexibility and hip  strengthening. He will continue to benefit from skilled PT interventions to address impairments and progress towards goals.    Rehab Potential  Fair    Clinical Impairments Affecting Rehab Potential  Positive: Highly motivated; Negative: Comorbidities    PT Frequency  2x / week   start at 1x/week and may increase to 2 during last 3 weeks   PT Duration  6 weeks    PT Treatment/Interventions  ADLs/Self Care Home Management;Aquatic Therapy;DME Instruction;Gait training;Functional mobility training;Stair training;Therapeutic activities;Therapeutic exercise;Balance training;Neuromuscular re-education;Patient/family education;Orthotic Fit/Training;Wheelchair mobility training;Manual techniques;Passive range of motion;Energy conservation;Dry needling;Splinting;Taping    PT Next Visit Plan  Educate on importance of regular moderate aerobic exercise to improve health and promote weight loss. Perform transfer training for patient to use bike at home safely. Review importance of strengthening and stretching at home and of stretching for improve posture and gait quality. Use machine-based strengthening to improve patient's independence with using equipment at gym. Continue with gait training and with functional and machine exercises for LE, sidestepping at mat, etc.    PT Home Exercise Plan  08/16/18:  seated DF/PF, LAQ, marching, abd with theraband and bridge; 11/13: HS and calf stretching (says he has been performing this at home); 01/24/2019 = prone hip flexor stretch; 3/12: eccentric STS    Consulted and Agree with Plan of Care  Patient       Patient will benefit from skilled therapeutic intervention in order to improve the following deficits and impairments:  Abnormal gait, Decreased balance, Decreased endurance, Decreased mobility, Difficulty walking, Hypomobility, Decreased range of motion, Decreased activity tolerance, Decreased strength, Impaired flexibility  Visit Diagnosis: Other abnormalities of gait  and mobility  Other symptoms and signs involving the musculoskeletal system  Muscle weakness (generalized)     Problem List Patient Active Problem List   Diagnosis Date Noted  . Pseudogout of knee   . Pyogenic arthritis of right knee joint ([place])   . Falls frequently 05/08/2018  . Fall   . Sleep disturbance   . Post-op pain   . Hypokalemia   . Morbid obesity ([place])   . Postoperative pain   . DVT, lower extremity, distal, acute, bilateral ([place])   . Acute blood loss anemia   . Anemia of chronic disease   . Essential hypertension   . Bacteremia   . Myelopathy ([place]) 03/30/2018  . Paraplegia ([place])   . Postlaminectomy syndrome, lumbar region   . Epidural abscess   . Abdominal distension   . Encephalopathy   . Weakness of both lower extremities   . Discitis of thoracic region   . Spinal cord stimulator status   . Staphylococcus aureus sepsis ([place]) 03/20/2018  . Bacteremia due to methicillin susceptible Staphylococcus aureus (MSSA) 03/18/2018  . Obesity 03/18/2018  . Nephrolithiasis 03/16/2018  . AKI (acute kidney injury) ([place]) 03/16/2018  . Cognitive impairment 03/16/2018  . Chronic pain syndrome 03/16/2018  . B12 deficiency 01/28/2016  . Memory difficulty 07/23/2015  . History of cerebrovascular disease 07/23/2015  . FH: colon cancer 08/13/2014  . Hx of adenomatous colonic polyps 08/13/2014  . Chronic back pain 05/30/2012  . CAD,  NATIVE VESSEL 07/17/2010  . HYPERCHOLESTEROLEMIA 10/31/2009  . SMOKELESS TOBACCO ABUSE 10/31/2009  . Obstructive sleep apnea 10/31/2009  . Essential hypertension, benign 10/31/2009  . GERD 10/31/2009    Kipp Brood, PT, DPT, [facility] Physical Therapist with [facility]  05/09/2019 4:41 PM    [place] [address] [place], [place], [address] Phone: [phone]   Fax:  [phone]  Name: JILL RUPPE MRN: [id number] Date of Birth: 11-08-1946

## 2019-05-09 NOTE — Patient Instructions (Signed)
Supine Hamstring Stretch with Strap reps: 10 hold: 30 seconds daily: 1 weekly: 7   Exercise image step 1   Exercise image step 2  Setup  Begin lying on your back with one leg bent, your other leg straight, and a strap looped around that foot. Movement  Pull on the strap and lift your straight leg up until you feel a stretch in the back of your leg. Hold this position. Tip  Try to keep your knee straight and back flat on the bed during the exercise. Supine Bridge reps: 10 sets: 3 hold: 5 seconds daily: 1  weekly: 7      Exercise image step 1   Exercise image step 2  Setup  Begin lying on your back with your arms resting at your sides, your legs bent at the knees and your feet flat on the ground. Movement  Tighten your abdominals and slowly lift your hips off the floor into a bridge position, keeping your back straight. Tip  Make sure to keep your trunk stiff throughout the exercise and your arms flat on the floor.

## 2019-05-16 ENCOUNTER — Encounter (HOSPITAL_COMMUNITY): Payer: Self-pay

## 2019-05-16 ENCOUNTER — Ambulatory Visit (HOSPITAL_COMMUNITY): Payer: Medicare Other

## 2019-05-16 ENCOUNTER — Other Ambulatory Visit: Payer: Self-pay

## 2019-05-16 DIAGNOSIS — R2689 Other abnormalities of gait and mobility: Secondary | ICD-10-CM | POA: Diagnosis not present

## 2019-05-16 DIAGNOSIS — M6281 Muscle weakness (generalized): Secondary | ICD-10-CM

## 2019-05-16 DIAGNOSIS — R29898 Other symptoms and signs involving the musculoskeletal system: Secondary | ICD-10-CM

## 2019-05-16 NOTE — Therapy (Signed)
[place], [place], [address] Phone: [phone]   Fax:  [phone]  Physical Therapy Treatment  Patient Details  Name: Cody Velez MRN: [id number] Date of Birth: 10-31-1946 Referring Provider (PT): Asencion Noble, MD   Encounter Date: 05/16/2019  PT End of Session - 05/16/19 1238    Visit Number  3    Number of Visits  9    Date for PT Re-Evaluation  06/13/19    Authorization Type  Primary: Medicare; Secondary: [employer] (75 visits per calendar year)    Authorization Time Period  08/15/18 - 10/14/18; 10/13/18 to 11/24/18; 11/22/18 - 01/03/19; 01/03/19 to 02/16/19; 01/24/19-03/16/19; NEW: 05/02/19-06/13/19    Authorization - Visit Number  57   starting 2020   Authorization - Number of Visits  68    PT Start Time  0946    PT Stop Time  1032    PT Time Calculation (min)  46 min    Equipment Utilized During Treatment  Gait belt    Activity Tolerance  Patient tolerated treatment well    Behavior During Therapy  WFL for tasks assessed/performed       Past Medical History:  Diagnosis Date  . Allergic rhinitis   . Arthritis   . B12 deficiency   . Cervicogenic headache 01/28/2016  . Childhood asthma   . Chronic back pain   . Coronary atherosclerosis of native coronary artery    Mild nonobstructive CAD at catheterization January 2015  . Depression   . Essential hypertension   . Falls   . GERD (gastroesophageal reflux disease)   . History of cerebrovascular disease 07/23/2015  . History of pneumonia 02/2011  . Hyperlipidemia   . Kidney stone   . Memory difficulty 07/23/2015  . OSA (obstructive sleep apnea)    CPAP - Dr. [name]  . Prostate cancer ([place])   . PTSD (post-traumatic stress disorder)    [place]  . Rectal bleeding     Past Surgical History:  Procedure Laterality Date  . APPLICATION OF ROBOTIC ASSISTANCE FOR SPINAL PROCEDURE N/A 03/28/2018   Procedure: APPLICATION OF ROBOTIC ASSISTANCE FOR SPINAL PROCEDURE;  Surgeon:  Kristeen Miss, MD;  Location: [facility];  Service: Neurosurgery;  Laterality: N/A;  . BACK SURGERY  02/14/12   lumbar OR #7; "today redid L1L2; replaced screws; added bone from hip"  . BILATERAL KNEE ARTHROSCOPY    . COLONOSCOPY  10/15/2008   Dr. [name]: tubular adenoma   . COLONOSCOPY  12/17/2003   OIN:OMVEHM rectal and colon  . COLONOSCOPY N/A 09/05/2014   Procedure: COLONOSCOPY;  Surgeon: Daneil Dolin, MD;  [facility];  Service: Endoscopy;  Laterality: N/A;  7:30-rescheduled 9/17 to [place] notified pt  . CYSTOSCOPY WITH STENT PLACEMENT Right 01/27/2016   Procedure: CYSTOSCOPY WITH STENT PLACEMENT;  Surgeon: [name], MD;  Location: [facility];  Service: Urology;  Laterality: Right;  . CYSTOSCOPY/RETROGRADE/URETEROSCOPY/STONE EXTRACTION WITH BASKET Right 01/27/2016   Procedure: CYSTOSCOPY, RIGHT RETROGRADE, RIGHT URETEROSCOPY, STONE EXTRACTION ;  Surgeon: [name], MD;  Location: [facility];  Service: Urology;  Laterality: Right;  . ESOPHAGOGASTRODUODENOSCOPY  10/15/2008     Dr Rourk:Schatzki's ring status post dilation and disruption via 40 F Maloney dilator/ otherwise unremarkable esophagus, small hiatal hernia, multiple fundal gland polyps not manipulated, gastritis, negative H.pylori  . ESOPHAGOGASTRODUODENOSCOPY  06/21/02   CNO:[id number] sliding hiatal hernia with mild changes of reflux esophagitis limited to gastroesophageal junction.  Noncritical ring at distal esophagus, 3  cm proximal to gastroesophageal junction/Antral gastritis  . [place]   "broke face playing softball"  . FRACTURE SURGERY     "left wrist; broke it; took spur off"  . HOLMIUM LASER APPLICATION Right 0/12/930   Procedure: HOLMIUM LASER APPLICATION;  Surgeon: [name], MD;  Location: [facility];  Service: Urology;  Laterality: Right;  . KNEE ARTHROSCOPY Right 05/18/2018   Procedure: PARTIAL MEDIAL MENISECTOMY AND SURGICAL LAVAGE AND CHONDROPLASTY;  Surgeon: [name], MD;  Location: [facility];  Service: Orthopedics;  Laterality: Right;  . LEFT HEART CATHETERIZATION WITH CORONARY ANGIOGRAM N/A 12/27/2013   Procedure: LEFT HEART CATHETERIZATION WITH CORONARY ANGIOGRAM;  Surgeon: [name], MD;  Location: [facility];  Service: Cardiovascular;  Laterality: N/A;  . neck epidural    . POSTERIOR LUMBAR FUSION 4 LEVEL N/A 03/28/2018   Procedure: Thoracic eight -Lumbar two- FIXATION WITH SCREW PLACEMENT, DECOMPRESSION Thoracic ten-Thoracic eleven  FOR OSTEOMYELITIS;  Surgeon: Kristeen Miss, MD;  Location: [facility];  Service: Neurosurgery;  Laterality: N/A;  . SHOULDER SURGERY Bilateral   . TEE WITHOUT CARDIOVERSION N/A 03/21/2018   Procedure: TRANSESOPHAGEAL ECHOCARDIOGRAM (TEE) WITH PROPOFOL;  Surgeon: [name], MD;  [facility];  Service: Cardiovascular;  Laterality: N/A;    There were no vitals filed for this visit.  Subjective Assessment - 05/16/19 0949    Subjective  Patient reports he is doing well and his Rt shoulder has not been bothering him this week. He reports he needs to make more visits as he does not have anymore scheduled.     Pertinent History  T10-11 Epidural Abscess, back surgery on 03/28/18, right knee partial medial menisectomy and chondroplasty    Limitations  Standing;Walking;House hold activities;Lifting    Diagnostic tests  CT Lumbar Spine 05/12/18: "Spinal stimulator device, along with multilevel multisegment fusion hardware appear appropriately placed without adverse features."    Patient Stated Goals  To walk    Currently in Pain?  No/denies        [facility] Adult PT Treatment/Exercise - 05/16/19 0001      Knee/Hip Exercises: Aerobic   Nustep  8 minutes on hill profile 3 (level 6) 30 sec fast/30 sec easy      Knee/Hip Exercises: Standing   Forward Step Up  Both;2 sets;10 reps;Step Height: 4"    Forward Step Up Limitations  min assist for LE placement to position feet on step and prevent scissoring gait    Gait  Training  Fwd gait in // bars, ladder for LE placement to facilitate5 xRT with min assist for LE placement.    Other Standing Knee Exercises  Lateral stepping in// bars, ladder for stepping strategies. 3x RT with Bil UE support, min assist for bil LE placement to prevent scissoring      Knee/Hip Exercises: Seated   Other Seated Knee/Hip Exercises  Tricep press ups with blocks: 2x 10 reps, mirror for visual feedback, 3 sec holds        PT Education - 05/16/19 1239    Education Details  Educated patient on benefit of looking into nutritionist consult at [facility] to develop a plan for weight management. Educated on importance of stretching for improved gait mechanics. Edcuated on how to build blocks at home for triceps press ups.     Person(s) Educated  Patient    Methods  Explanation    Comprehension  Verbalized understanding       PT Short Term Goals - 05/02/19 1245  PT SHORT TERM GOAL #1   Title  Patient will participate in HEP daily and will be able to instruct therapist in exercises for stretching and strengthening to improve mobility.    Time  3    Period  Weeks    Status  New    Target Date  05/23/19      PT SHORT TERM GOAL #2   Title  Patient will report or demonstrate ability to perform ambulation for 5 minutes with rollator with no more than supervision assistance without rest break and reported no more than minimal difficulty.     Time  3    Period  Weeks    Status  New        PT Long Term Goals - 05/02/19 1250      PT LONG TERM GOAL #1   Title  Patient will report or demonstrate ability to perform ambulation for 8 minutes with LRAD with no more than supervision assistance without rest break and reported no more than minimal difficulty.     Time  6    Period  Weeks    Status  New    Target Date  06/13/19      PT LONG TERM GOAL #2   Title  Patient will demonstrate ability to stand at a support surface for 10 minutes without a rest break in order to perform household  chores and ADL's with increased ease.     Time  6    Period  Weeks    Status  New      PT LONG TERM GOAL #3   Title  Patient will demonstrate ability to ambulate 120 feet without rest break using LRAD with no more than minimal assistance.    Time  6    Period  Weeks    Status  New      PT LONG TERM GOAL #4   Title  Patient will be participating in exercise program at home or [employer], 40-60 minutes of moderate aerobic exercise in addition to therapy, to improve health/wellness in order to lose weight for improved mobility and function.    Time  6    Period  Weeks    Status  New      PT LONG TERM GOAL #5   Title  Patient will demonstrate safe transfer to/from arm bike at home to be able to safely exercise for weight loss program to improve health/wellness and increase physical activity at home.    Time  6    Period  Weeks    Status  New        Plan - 05/16/19 1238    Clinical Impression Statement  Patient continued with step strategy and standing exercises in // bars this date. He increased repetitions for step ups and attempted Standing without UE support upon standing both feet on the step. He also performed lateral and forward gait in ladder to facilitate equal step length with min assist to manage foot placement. Pt continues to have dysmetria greater on Rt LE and required more assistance with Rt to prevent scissoring. He was instructed on tricep press ups for UE strengthening as he reports he has some step to get into and out of the pool and believes scooting up them would be the best method to get out. He was educated that this would be unsafe to attempt at this time. NuStep performed again at [facility] with 30 second intervals and patient was able to increase resistance level  by 2 and maintain SPM above 100 during fast intervals. He will continue to benefit from skilled PT interventions to address impairments and progress towards goals.    Rehab Potential  Fair    Clinical Impairments  Affecting Rehab Potential  Positive: Highly motivated; Negative: Comorbidities    PT Frequency  2x / week   start at 1x/week and may increase to 2 during last 3 weeks   PT Duration  6 weeks    PT Treatment/Interventions  ADLs/Self Care Home Management;Aquatic Therapy;DME Instruction;Gait training;Functional mobility training;Stair training;Therapeutic activities;Therapeutic exercise;Balance training;Neuromuscular re-education;Patient/family education;Orthotic Fit/Training;Wheelchair mobility training;Manual techniques;Passive range of motion;Energy conservation;Dry needling;Splinting;Taping    PT Next Visit Plan   Educate on importance of regular moderate aerobic exercise to improve health and promote weight loss. Review importance of strengthening and stretching at home and of stretching for improve posture and gait quality. Use machine-based strengthening to improve patient's independence with using equipment at gym. Continue tricep press ups and consider working on floor to stand transfers with soft mat as pt reports he gets himself up from the floor.     PT Home Exercise Plan  08/16/18:  seated DF/PF, LAQ, marching, abd with theraband and bridge; 11/13: HS and calf stretching (says he has been performing this at home); 01/24/2019 = prone hip flexor stretch; 3/12: eccentric STS    Consulted and Agree with Plan of Care  Patient       Patient will benefit from skilled therapeutic intervention in order to improve the following deficits and impairments:  Abnormal gait, Decreased balance, Decreased endurance, Decreased mobility, Difficulty walking, Hypomobility, Decreased range of motion, Decreased activity tolerance, Decreased strength, Impaired flexibility  Visit Diagnosis: Other abnormalities of gait and mobility  Other symptoms and signs involving the musculoskeletal system  Muscle weakness (generalized)     Problem List Patient Active Problem List   Diagnosis Date Noted  . Pseudogout of  knee   . Pyogenic arthritis of right knee joint ([place])   . Falls frequently 05/08/2018  . Fall   . Sleep disturbance   . Post-op pain   . Hypokalemia   . Morbid obesity ([place])   . Postoperative pain   . DVT, lower extremity, distal, acute, bilateral ([place])   . Acute blood loss anemia   . Anemia of chronic disease   . Essential hypertension   . Bacteremia   . Myelopathy ([place]) 03/30/2018  . Paraplegia ([place])   . Postlaminectomy syndrome, lumbar region   . Epidural abscess   . Abdominal distension   . Encephalopathy   . Weakness of both lower extremities   . Discitis of thoracic region   . Spinal cord stimulator status   . Staphylococcus aureus sepsis ([place]) 03/20/2018  . Bacteremia due to methicillin susceptible Staphylococcus aureus (MSSA) 03/18/2018  . Obesity 03/18/2018  . Nephrolithiasis 03/16/2018  . AKI (acute kidney injury) ([place]) 03/16/2018  . Cognitive impairment 03/16/2018  . Chronic pain syndrome 03/16/2018  . B12 deficiency 01/28/2016  . Memory difficulty 07/23/2015  . History of cerebrovascular disease 07/23/2015  . FH: colon cancer 08/13/2014  . Hx of adenomatous colonic polyps 08/13/2014  . Chronic back pain 05/30/2012  . CAD, NATIVE VESSEL 07/17/2010  . HYPERCHOLESTEROLEMIA 10/31/2009  . SMOKELESS TOBACCO ABUSE 10/31/2009  . Obstructive sleep apnea 10/31/2009  . Essential hypertension, benign 10/31/2009  . GERD 10/31/2009    Kipp Brood, PT, DPT, [facility] Physical Therapist with [facility]  05/16/2019 12:48 PM    [facility]  [place] [address] [place], [place], [address] Phone: [phone]   Fax:  [phone]  Name: Cody Velez MRN: [id number] Date of Birth: Sep 03, 1946

## 2019-05-23 DIAGNOSIS — I10 Essential (primary) hypertension: Secondary | ICD-10-CM | POA: Diagnosis not present

## 2019-05-23 DIAGNOSIS — G061 Intraspinal abscess and granuloma: Secondary | ICD-10-CM | POA: Diagnosis not present

## 2019-05-23 DIAGNOSIS — Z6839 Body mass index (BMI) 39.0-39.9, adult: Secondary | ICD-10-CM | POA: Diagnosis not present

## 2019-05-24 ENCOUNTER — Ambulatory Visit (HOSPITAL_COMMUNITY): Payer: Medicare Other | Attending: Internal Medicine

## 2019-05-24 ENCOUNTER — Other Ambulatory Visit: Payer: Self-pay

## 2019-05-24 ENCOUNTER — Encounter (HOSPITAL_COMMUNITY): Payer: Self-pay

## 2019-05-24 DIAGNOSIS — R2689 Other abnormalities of gait and mobility: Secondary | ICD-10-CM | POA: Insufficient documentation

## 2019-05-24 DIAGNOSIS — M6281 Muscle weakness (generalized): Secondary | ICD-10-CM | POA: Insufficient documentation

## 2019-05-24 DIAGNOSIS — R29898 Other symptoms and signs involving the musculoskeletal system: Secondary | ICD-10-CM | POA: Diagnosis not present

## 2019-05-24 NOTE — Therapy (Signed)
[place], [place], [address] Phone: [phone]   Fax:  [phone]  Physical Therapy Treatment  Patient Details  Name: Cody Velez MRN: [id number] Date of Birth: 03/21/1946 Referring Provider (PT): Asencion Noble, MD   Encounter Date: 05/24/2019  PT End of Session - 05/24/19 1423    Visit Number  4    Number of Visits  9    Date for PT Re-Evaluation  06/13/19    Authorization Type  Primary: Medicare; Secondary: [employer] (75 visits per calendar year)    Authorization Time Period  08/15/18 - 10/14/18; 10/13/18 to 11/24/18; 11/22/18 - 01/03/19; 01/03/19 to 02/16/19; 01/24/19-03/16/19; NEW: 05/02/19-06/13/19    Authorization - Visit Number  28   starting 2020   Authorization - Number of Visits  52    PT Start Time  1410    PT Stop Time  1452    PT Time Calculation (min)  42 min    Equipment Utilized During Treatment  Gait belt    Activity Tolerance  Patient tolerated treatment well    Behavior During Therapy  WFL for tasks assessed/performed       Past Medical History:  Diagnosis Date  . Allergic rhinitis   . Arthritis   . B12 deficiency   . Cervicogenic headache 01/28/2016  . Childhood asthma   . Chronic back pain   . Coronary atherosclerosis of native coronary artery    Mild nonobstructive CAD at catheterization January 2015  . Depression   . Essential hypertension   . Falls   . GERD (gastroesophageal reflux disease)   . History of cerebrovascular disease 07/23/2015  . History of pneumonia 02/2011  . Hyperlipidemia   . Kidney stone   . Memory difficulty 07/23/2015  . OSA (obstructive sleep apnea)    CPAP - Dr. [name]  . Prostate cancer ([place])   . PTSD (post-traumatic stress disorder)    [place]  . Rectal bleeding     Past Surgical History:  Procedure Laterality Date  . APPLICATION OF ROBOTIC ASSISTANCE FOR SPINAL PROCEDURE N/A 03/28/2018   Procedure: APPLICATION OF ROBOTIC ASSISTANCE FOR SPINAL PROCEDURE;  Surgeon:  Kristeen Miss, MD;  Location: [facility];  Service: Neurosurgery;  Laterality: N/A;  . BACK SURGERY  02/14/12   lumbar OR #7; "today redid L1L2; replaced screws; added bone from hip"  . BILATERAL KNEE ARTHROSCOPY    . COLONOSCOPY  10/15/2008   Dr. [name]: tubular adenoma   . COLONOSCOPY  12/17/2003   OFB:PZWCHE rectal and colon  . COLONOSCOPY N/A 09/05/2014   Procedure: COLONOSCOPY;  Surgeon: Daneil Dolin, MD;  [facility];  Service: Endoscopy;  Laterality: N/A;  7:30-rescheduled 9/17 to [place] notified pt  . CYSTOSCOPY WITH STENT PLACEMENT Right 01/27/2016   Procedure: CYSTOSCOPY WITH STENT PLACEMENT;  Surgeon: [name], MD;  Location: [facility];  Service: Urology;  Laterality: Right;  . CYSTOSCOPY/RETROGRADE/URETEROSCOPY/STONE EXTRACTION WITH BASKET Right 01/27/2016   Procedure: CYSTOSCOPY, RIGHT RETROGRADE, RIGHT URETEROSCOPY, STONE EXTRACTION ;  Surgeon: [name], MD;  Location: [facility];  Service: Urology;  Laterality: Right;  . ESOPHAGOGASTRODUODENOSCOPY  10/15/2008     Dr Rourk:Schatzki's ring status post dilation and disruption via 47 F Maloney dilator/ otherwise unremarkable esophagus, small hiatal hernia, multiple fundal gland polyps not manipulated, gastritis, negative H.pylori  . ESOPHAGOGASTRODUODENOSCOPY  06/21/02   NID:POEUM sliding hiatal hernia with mild changes of reflux esophagitis limited to gastroesophageal junction.  Noncritical ring at distal esophagus, 3  cm proximal to gastroesophageal junction/Antral gastritis  . [place]   "broke face playing softball"  . FRACTURE SURGERY     "left wrist; broke it; took spur off"  . HOLMIUM LASER APPLICATION Right 02/21/3613   Procedure: HOLMIUM LASER APPLICATION;  Surgeon: [name], MD;  Location: [facility];  Service: Urology;  Laterality: Right;  . KNEE ARTHROSCOPY Right 05/18/2018   Procedure: PARTIAL MEDIAL MENISECTOMY AND SURGICAL LAVAGE AND CHONDROPLASTY;  Surgeon: [name], MD;  Location: [facility];  Service: Orthopedics;  Laterality: Right;  . LEFT HEART CATHETERIZATION WITH CORONARY ANGIOGRAM N/A 12/27/2013   Procedure: LEFT HEART CATHETERIZATION WITH CORONARY ANGIOGRAM;  Surgeon: [name], MD;  Location: [facility] C[facility];  Service: Cardiovascular;  Laterality: N/A;  . neck epidural    . POSTERIOR LUMBAR FUSION 4 LEVEL N/A 03/28/2018   Procedure: Thoracic eight -Lumbar two- FIXATION WITH SCREW PLACEMENT, DECOMPRESSION Thoracic ten-Thoracic eleven  FOR OSTEOMYELITIS;  Surgeon: Kristeen Miss, MD;  Location: [facility];  Service: Neurosurgery;  Laterality: N/A;  . SHOULDER SURGERY Bilateral   . TEE WITHOUT CARDIOVERSION N/A 03/21/2018   Procedure: TRANSESOPHAGEAL ECHOCARDIOGRAM (TEE) WITH PROPOFOL;  Surgeon: [name], MD;  [facility];  Service: Cardiovascular;  Laterality: N/A;    There were no vitals filed for this visit.  Subjective Assessment - 05/24/19 1424    Subjective  Pt states that he is doing well today.     Pertinent History  T10-11 Epidural Abscess, back surgery on 03/28/18, right knee partial medial menisectomy and chondroplasty    Limitations  Standing;Walking;House hold activities;Lifting    Diagnostic tests  CT Lumbar Spine 05/12/18: "Spinal stimulator device, along with multilevel multisegment fusion hardware appear appropriately placed without adverse features."    Patient Stated Goals  To walk    Currently in Pain?  No/denies            [facility] Adult PT Treatment/Exercise - 05/24/19 0001      Ambulation/Gait   Ambulation/Gait  Yes    Ambulation/Gait Assistance  4: Min guard    Ambulation/Gait Assistance Details  w/c follow for safety    Ambulation Distance (Feet)  226 Feet   118ft x2   Assistive device  Rolling walker    Gait Pattern  Decreased dorsiflexion - left;Decreased dorsiflexion - right;Trunk flexed;Decreased step length - right;Decreased step length - left;Step-through pattern      Knee/Hip Exercises: Aerobic    Other Aerobic  UBE HIIT level 3 fwd -       Knee/Hip Exercises: Standing   Gait Training  Fwd gait in // bars, ladder for LE placement to facilitate x3RT with min assist for LE placement.    Other Standing Knee Exercises  Lateral stepping in// bars, ladder for stepping strategies. 3x RT with Bil UE support (no assist needed for LE placement this date)      Knee/Hip Exercises: Seated   Other Seated Knee/Hip Exercises  Tricep press ups with blocks: 3x10 reps, mirror for visual feedback, 3 sec holds             PT Education - 05/24/19 1424    Education Details  exercise technique, continue HEP    Person(s) Educated  Patient    Methods  Explanation;Demonstration    Comprehension  Verbalized understanding;Returned demonstration       PT Short Term Goals - 05/02/19 1245      PT SHORT TERM GOAL #1   Title  Patient will participate  in HEP daily and will be able to instruct therapist in exercises for stretching and strengthening to improve mobility.    Time  3    Period  Weeks    Status  New    Target Date  05/23/19      PT SHORT TERM GOAL #2   Title  Patient will report or demonstrate ability to perform ambulation for 5 minutes with rollator with no more than supervision assistance without rest break and reported no more than minimal difficulty.     Time  3    Period  Weeks    Status  New        PT Long Term Goals - 05/02/19 1250      PT LONG TERM GOAL #1   Title  Patient will report or demonstrate ability to perform ambulation for 8 minutes with LRAD with no more than supervision assistance without rest break and reported no more than minimal difficulty.     Time  6    Period  Weeks    Status  New    Target Date  06/13/19      PT LONG TERM GOAL #2   Title  Patient will demonstrate ability to stand at a support surface for 10 minutes without a rest break in order to perform household chores and ADL's with increased ease.     Time  6    Period  Weeks    Status  New       PT LONG TERM GOAL #3   Title  Patient will demonstrate ability to ambulate 120 feet without rest break using LRAD with no more than minimal assistance.    Time  6    Period  Weeks    Status  New      PT LONG TERM GOAL #4   Title  Patient will be participating in exercise program at home or [employer], 40-60 minutes of moderate aerobic exercise in addition to therapy, to improve health/wellness in order to lose weight for improved mobility and function.    Time  6    Period  Weeks    Status  New      PT LONG TERM GOAL #5   Title  Patient will demonstrate safe transfer to/from arm bike at home to be able to safely exercise for weight loss program to improve health/wellness and increase physical activity at home.    Time  6    Period  Weeks    Status  New            Plan - 05/24/19 1455    Clinical Impression Statement  Continued with established POC focusing on gait training, coordination, tricep strength, and aerobic exercise. No assistance needed this date with gait thru the ladder in // bars. Increased reps on tricep push-ups with bars with good tolerance. Began UBE HIIT for aerobic training (10sec on : 30 sec off). No increased pain at EOS.    Rehab Potential  Fair    Clinical Impairments Affecting Rehab Potential  Positive: Highly motivated; Negative: Comorbidities    PT Frequency  2x / week   start at 1x/week and may increase to 2 during last 3 weeks   PT Duration  6 weeks    PT Treatment/Interventions  ADLs/Self Care Home Management;Aquatic Therapy;DME Instruction;Gait training;Functional mobility training;Stair training;Therapeutic activities;Therapeutic exercise;Balance training;Neuromuscular re-education;Patient/family education;Orthotic Fit/Training;Wheelchair mobility training;Manual techniques;Passive range of motion;Energy conservation;Dry needling;Splinting;Taping    PT Next Visit Plan  continue educate on importance  of regular moderate aerobic exercise to improve health  and promote weight loss. Review importance of strengthening and stretching at home and of stretching for improve posture and gait quality. Use machine-based strengthening to improve patient's independence with using equipment at gym. Continue tricep press ups and consider working on floor to stand transfers with soft mat as pt reports he gets himself up from the floor.     PT Home Exercise Plan  08/16/18:  seated DF/PF, LAQ, marching, abd with theraband and bridge; 11/13: HS and calf stretching (says he has been performing this at home); 01/24/2019 = prone hip flexor stretch; 3/12: eccentric STS    Consulted and Agree with Plan of Care  Patient       Patient will benefit from skilled therapeutic intervention in order to improve the following deficits and impairments:  Abnormal gait, Decreased balance, Decreased endurance, Decreased mobility, Difficulty walking, Hypomobility, Decreased range of motion, Decreased activity tolerance, Decreased strength, Impaired flexibility  Visit Diagnosis: Other abnormalities of gait and mobility  Other symptoms and signs involving the musculoskeletal system  Muscle weakness (generalized)     Problem List Patient Active Problem List   Diagnosis Date Noted  . Pseudogout of knee   . Pyogenic arthritis of right knee joint ([place])   . Falls frequently 05/08/2018  . Fall   . Sleep disturbance   . Post-op pain   . Hypokalemia   . Morbid obesity ([place])   . Postoperative pain   . DVT, lower extremity, distal, acute, bilateral ([place])   . Acute blood loss anemia   . Anemia of chronic disease   . Essential hypertension   . Bacteremia   . Myelopathy ([place]) 03/30/2018  . Paraplegia ([place])   . Postlaminectomy syndrome, lumbar region   . Epidural abscess   . Abdominal distension   . Encephalopathy   . Weakness of both lower extremities   . Discitis of thoracic region   . Spinal cord stimulator status   . Staphylococcus aureus sepsis ([place]) 03/20/2018  . Bacteremia  due to methicillin susceptible Staphylococcus aureus (MSSA) 03/18/2018  . Obesity 03/18/2018  . Nephrolithiasis 03/16/2018  . AKI (acute kidney injury) ([place]) 03/16/2018  . Cognitive impairment 03/16/2018  . Chronic pain syndrome 03/16/2018  . B12 deficiency 01/28/2016  . Memory difficulty 07/23/2015  . History of cerebrovascular disease 07/23/2015  . FH: colon cancer 08/13/2014  . Hx of adenomatous colonic polyps 08/13/2014  . Chronic back pain 05/30/2012  . CAD, NATIVE VESSEL 07/17/2010  . HYPERCHOLESTEROLEMIA 10/31/2009  . SMOKELESS TOBACCO ABUSE 10/31/2009  . Obstructive sleep apnea 10/31/2009  . Essential hypertension, benign 10/31/2009  . GERD 10/31/2009        Geraldine Solar PT, DPT    [facility] [address] [place], [place], [address] Phone: [phone]   Fax:  [phone]  Name: Cody Velez MRN: [id number] Date of Birth: [birth year]

## 2019-05-25 ENCOUNTER — Encounter (HOSPITAL_COMMUNITY): Payer: Self-pay

## 2019-05-25 ENCOUNTER — Ambulatory Visit (HOSPITAL_COMMUNITY): Payer: Medicare Other

## 2019-05-25 DIAGNOSIS — M6281 Muscle weakness (generalized): Secondary | ICD-10-CM

## 2019-05-25 DIAGNOSIS — R29898 Other symptoms and signs involving the musculoskeletal system: Secondary | ICD-10-CM

## 2019-05-25 DIAGNOSIS — R2689 Other abnormalities of gait and mobility: Secondary | ICD-10-CM | POA: Diagnosis not present

## 2019-05-25 NOTE — Therapy (Signed)
[place], [place], [address] Phone: [phone]   Fax:  [phone]  Physical Therapy Treatment  Patient Details  Name: Cody Velez MRN: [id number] Date of Birth: 02-Jul-1946 Referring Provider (PT): Asencion Noble, MD   Encounter Date: 05/25/2019  PT End of Session - 05/25/19 1012    Visit Number  5    Number of Visits  9    Date for PT Re-Evaluation  06/13/19    Authorization Type  Primary: Medicare; Secondary: [employer] (75 visits per calendar year)    Authorization Time Period  08/15/18 - 10/14/18; 10/13/18 to 11/24/18; 11/22/18 - 01/03/19; 01/03/19 to 02/16/19; 01/24/19-03/16/19; NEW: 05/02/19-06/13/19    Authorization - Visit Number  45   starting 2020   Authorization - Number of Visits  66    PT Start Time  1012    PT Stop Time  1100    PT Time Calculation (min)  48 min    Equipment Utilized During Treatment  Gait belt    Activity Tolerance  Patient tolerated treatment well    Behavior During Therapy  WFL for tasks assessed/performed       Past Medical History:  Diagnosis Date  . Allergic rhinitis   . Arthritis   . B12 deficiency   . Cervicogenic headache 01/28/2016  . Childhood asthma   . Chronic back pain   . Coronary atherosclerosis of native coronary artery    Mild nonobstructive CAD at catheterization January 2015  . Depression   . Essential hypertension   . Falls   . GERD (gastroesophageal reflux disease)   . History of cerebrovascular disease 07/23/2015  . History of pneumonia 02/2011  . Hyperlipidemia   . Kidney stone   . Memory difficulty 07/23/2015  . OSA (obstructive sleep apnea)    CPAP - Dr. [name]  . Prostate cancer ([place])   . PTSD (post-traumatic stress disorder)    [place]  . Rectal bleeding     Past Surgical History:  Procedure Laterality Date  . APPLICATION OF ROBOTIC ASSISTANCE FOR SPINAL PROCEDURE N/A 03/28/2018   Procedure: APPLICATION OF ROBOTIC ASSISTANCE FOR SPINAL PROCEDURE;  Surgeon:  Kristeen Miss, MD;  Location: [facility];  Service: Neurosurgery;  Laterality: N/A;  . BACK SURGERY  02/14/12   lumbar OR #7; "today redid L1L2; replaced screws; added bone from hip"  . BILATERAL KNEE ARTHROSCOPY    . COLONOSCOPY  10/15/2008   Dr. [name]: tubular adenoma   . COLONOSCOPY  12/17/2003   XTK:WIOXBD rectal and colon  . COLONOSCOPY N/A 09/05/2014   Procedure: COLONOSCOPY;  Surgeon: Daneil Dolin, MD;  [facility];  Service: Endoscopy;  Laterality: N/A;  7:30-rescheduled 9/17 to [place] notified pt  . CYSTOSCOPY WITH STENT PLACEMENT Right 01/27/2016   Procedure: CYSTOSCOPY WITH STENT PLACEMENT;  Surgeon: [name], MD;  Location: [facility];  Service: Urology;  Laterality: Right;  . CYSTOSCOPY/RETROGRADE/URETEROSCOPY/STONE EXTRACTION WITH BASKET Right 01/27/2016   Procedure: CYSTOSCOPY, RIGHT RETROGRADE, RIGHT URETEROSCOPY, STONE EXTRACTION ;  Surgeon: [name], MD;  Location: [facility];  Service: Urology;  Laterality: Right;  . ESOPHAGOGASTRODUODENOSCOPY  10/15/2008     Dr Rourk:Schatzki's ring status post dilation and disruption via 43 F Maloney dilator/ otherwise unremarkable esophagus, small hiatal hernia, multiple fundal gland polyps not manipulated, gastritis, negative H.pylori  . ESOPHAGOGASTRODUODENOSCOPY  06/21/02   [facility]:DJMEQ sliding hiatal hernia with mild changes of reflux esophagitis limited to gastroesophageal junction.  Noncritical ring at distal esophagus, 3  cm proximal to gastroesophageal junction/Antral gastritis  . [place]   "broke face playing softball"  . FRACTURE SURGERY     "left wrist; broke it; took spur off"  . HOLMIUM LASER APPLICATION Right 04/22/85   Procedure: HOLMIUM LASER APPLICATION;  Surgeon: [name], MD;  Location: [facility];  Service: Urology;  Laterality: Right;  . KNEE ARTHROSCOPY Right 05/18/2018   Procedure: PARTIAL MEDIAL MENISECTOMY AND SURGICAL LAVAGE AND CHONDROPLASTY;  Surgeon: [name], MD;  Location: [facility];  Service: Orthopedics;  Laterality: Right;  . LEFT HEART CATHETERIZATION WITH CORONARY ANGIOGRAM N/A 12/27/2013   Procedure: LEFT HEART CATHETERIZATION WITH CORONARY ANGIOGRAM;  Surgeon: [name], MD;  Location: [facility];  Service: Cardiovascular;  Laterality: N/A;  . neck epidural    . POSTERIOR LUMBAR FUSION 4 LEVEL N/A 03/28/2018   Procedure: Thoracic eight -Lumbar two- FIXATION WITH SCREW PLACEMENT, DECOMPRESSION Thoracic ten-Thoracic eleven  FOR OSTEOMYELITIS;  Surgeon: Kristeen Miss, MD;  Location: [facility];  Service: Neurosurgery;  Laterality: N/A;  . SHOULDER SURGERY Bilateral   . TEE WITHOUT CARDIOVERSION N/A 03/21/2018   Procedure: TRANSESOPHAGEAL ECHOCARDIOGRAM (TEE) WITH PROPOFOL;  Surgeon: [name], MD;  [facility];  Service: Cardiovascular;  Laterality: N/A;    There were no vitals filed for this visit.  Subjective Assessment - 05/25/19 1107    Subjective  Pt states that he is sore from yesterday's session.    Pertinent History  T10-11 Epidural Abscess, back surgery on 03/28/18, right knee partial medial menisectomy and chondroplasty    Limitations  Standing;Walking;House hold activities;Lifting    Diagnostic tests  CT Lumbar Spine 05/12/18: "Spinal stimulator device, along with multilevel multisegment fusion hardware appear appropriately placed without adverse features."    Patient Stated Goals  To walk    Currently in Pain?  No/denies            [facility] Adult PT Treatment/Exercise - 05/25/19 0001      Ambulation/Gait   Ambulation/Gait  Yes    Ambulation/Gait Assistance  4: Min guard    Ambulation/Gait Assistance Details  w/c follow for safety    Ambulation Distance (Feet)  174 Feet   with 3 seated rest breaks   Assistive device  Rolling walker    Gait Pattern  Decreased dorsiflexion - left;Decreased dorsiflexion - right;Trunk flexed;Decreased step length - right;Decreased step length - left;Step-through pattern       Knee/Hip Exercises: Aerobic   Nustep  8 minutes on hill profile 3 (level 6) 30 sec fast/30 sec easy      Knee/Hip Exercises: Supine   Other Supine Knee/Hip Exercises  bil single leg hip ups on 12" step x10 reps each      Knee/Hip Exercises: Prone   Straight Leg Raises Limitations  attempted tall kneeling but unable due to pain    Other Prone Exercises  quadruped UE ext x10 reps each    Other Prone Exercises  quadruped LE ext x5 reps each           PT Education - 05/25/19 1012    Education Details  exercise technique, conitnue HEP    Person(s) Educated  Patient    Methods  Explanation;Demonstration    Comprehension  Verbalized understanding;Returned demonstration       PT Short Term Goals - 05/02/19 1245      PT SHORT TERM GOAL #1   Title  Patient will participate in HEP daily and will be able to instruct  therapist in exercises for stretching and strengthening to improve mobility.    Time  3    Period  Weeks    Status  New    Target Date  05/23/19      PT SHORT TERM GOAL #2   Title  Patient will report or demonstrate ability to perform ambulation for 5 minutes with rollator with no more than supervision assistance without rest break and reported no more than minimal difficulty.     Time  3    Period  Weeks    Status  New        PT Long Term Goals - 05/02/19 1250      PT LONG TERM GOAL #1   Title  Patient will report or demonstrate ability to perform ambulation for 8 minutes with LRAD with no more than supervision assistance without rest break and reported no more than minimal difficulty.     Time  6    Period  Weeks    Status  New    Target Date  06/13/19      PT LONG TERM GOAL #2   Title  Patient will demonstrate ability to stand at a support surface for 10 minutes without a rest break in order to perform household chores and ADL's with increased ease.     Time  6    Period  Weeks    Status  New      PT LONG TERM GOAL #3   Title  Patient will demonstrate  ability to ambulate 120 feet without rest break using LRAD with no more than minimal assistance.    Time  6    Period  Weeks    Status  New      PT LONG TERM GOAL #4   Title  Patient will be participating in exercise program at home or [employer], 40-60 minutes of moderate aerobic exercise in addition to therapy, to improve health/wellness in order to lose weight for improved mobility and function.    Time  6    Period  Weeks    Status  New      PT LONG TERM GOAL #5   Title  Patient will demonstrate safe transfer to/from arm bike at home to be able to safely exercise for weight loss program to improve health/wellness and increase physical activity at home.    Time  6    Period  Weeks    Status  New            Plan - 05/25/19 1105    Clinical Impression Statement  Pt presenting to therapy reporting DOMS from yesterday's session. Continued with gait training at beginning of session; pt required 3 seated rest breaks and ambulated slightly less than yesterday, but overall did well. He had 1 occurrence of a stumble but corrected it independently and then he had 1 bout of R knee buckle but again, he was able to maintain balance independently with RW. Added single leg hip ups on 12" step in supine this date for hip strengthening. Attempted tall kneeling for core strength and hip stability/strength but pt unable due to knee pain. Did add quadruped UE and LE ext for core and hip strength and overall stability; pt did well but was challenged with this. Ended with HIIT on Nustep this date for aerobic training. Will add in more isolated back mm strengthening in future sessions. Continue as planned, progressing as able.     Rehab Potential  Fair  Clinical Impairments Affecting Rehab Potential  Positive: Highly motivated; Negative: Comorbidities    PT Frequency  2x / week   start at 1x/week and may increase to 2 during last 3 weeks   PT Duration  6 weeks    PT Treatment/Interventions  ADLs/Self Care  Home Management;Aquatic Therapy;DME Instruction;Gait training;Functional mobility training;Stair training;Therapeutic activities;Therapeutic exercise;Balance training;Neuromuscular re-education;Patient/family education;Orthotic Fit/Training;Wheelchair mobility training;Manual techniques;Passive range of motion;Energy conservation;Dry needling;Splinting;Taping    PT Next Visit Plan  add lat pulldowns and rowing for back strengthening; consider working on floor to stand transfers with soft mat as pt reports he gets himself up from the floor. continue educate on importance of regular moderate aerobic exercise to improve health and promote weight loss. Review importance of strengthening and stretching at home and of stretching for improve posture and gait quality. Use machine-based strengthening to improve patient's independence with using equipment at gym. Continue tricep press ups    PT Home Exercise Plan  08/16/18:  seated DF/PF, LAQ, marching, abd with theraband and bridge; 11/13: HS and calf stretching (says he has been performing this at home); 01/24/2019 = prone hip flexor stretch; 3/12: eccentric STS    Consulted and Agree with Plan of Care  Patient       Patient will benefit from skilled therapeutic intervention in order to improve the following deficits and impairments:  Abnormal gait, Decreased balance, Decreased endurance, Decreased mobility, Difficulty walking, Hypomobility, Decreased range of motion, Decreased activity tolerance, Decreased strength, Impaired flexibility  Visit Diagnosis: Other abnormalities of gait and mobility  Other symptoms and signs involving the musculoskeletal system  Muscle weakness (generalized)     Problem List Patient Active Problem List   Diagnosis Date Noted  . Pseudogout of knee   . Pyogenic arthritis of right knee joint ([place])   . Falls frequently 05/08/2018  . Fall   . Sleep disturbance   . Post-op pain   . Hypokalemia   . Morbid obesity ([place])   .  Postoperative pain   . DVT, lower extremity, distal, acute, bilateral ([place])   . Acute blood loss anemia   . Anemia of chronic disease   . Essential hypertension   . Bacteremia   . Myelopathy ([place]) 03/30/2018  . Paraplegia ([place])   . Postlaminectomy syndrome, lumbar region   . Epidural abscess   . Abdominal distension   . Encephalopathy   . Weakness of both lower extremities   . Discitis of thoracic region   . Spinal cord stimulator status   . Staphylococcus aureus sepsis ([place]) 03/20/2018  . Bacteremia due to methicillin susceptible Staphylococcus aureus (MSSA) 03/18/2018  . Obesity 03/18/2018  . Nephrolithiasis 03/16/2018  . AKI (acute kidney injury) ([place]) 03/16/2018  . Cognitive impairment 03/16/2018  . Chronic pain syndrome 03/16/2018  . B12 deficiency 01/28/2016  . Memory difficulty 07/23/2015  . History of cerebrovascular disease 07/23/2015  . FH: colon cancer 08/13/2014  . Hx of adenomatous colonic polyps 08/13/2014  . Chronic back pain 05/30/2012  . CAD, NATIVE VESSEL 07/17/2010  . HYPERCHOLESTEROLEMIA 10/31/2009  . SMOKELESS TOBACCO ABUSE 10/31/2009  . Obstructive sleep apnea 10/31/2009  . Essential hypertension, benign 10/31/2009  . GERD 10/31/2009       Geraldine Solar PT, DPT   [facility] [address] [place], [place], [address] Phone: [phone]   Fax:  [phone]  Name: AVETT REINECK MRN: [id number] Date of Birth: 04-15-1946

## 2019-05-28 ENCOUNTER — Ambulatory Visit (INDEPENDENT_AMBULATORY_CARE_PROVIDER_SITE_OTHER): Payer: Medicare Other | Admitting: Internal Medicine

## 2019-05-28 ENCOUNTER — Encounter (HOSPITAL_COMMUNITY): Payer: Self-pay

## 2019-05-28 ENCOUNTER — Other Ambulatory Visit: Payer: Self-pay

## 2019-05-28 ENCOUNTER — Ambulatory Visit (HOSPITAL_COMMUNITY): Payer: Medicare Other

## 2019-05-28 ENCOUNTER — Encounter: Payer: Self-pay | Admitting: Internal Medicine

## 2019-05-28 DIAGNOSIS — R29898 Other symptoms and signs involving the musculoskeletal system: Secondary | ICD-10-CM

## 2019-05-28 DIAGNOSIS — M4644 Discitis, unspecified, thoracic region: Secondary | ICD-10-CM

## 2019-05-28 DIAGNOSIS — M6281 Muscle weakness (generalized): Secondary | ICD-10-CM

## 2019-05-28 DIAGNOSIS — I2510 Atherosclerotic heart disease of native coronary artery without angina pectoris: Secondary | ICD-10-CM

## 2019-05-28 DIAGNOSIS — R2689 Other abnormalities of gait and mobility: Secondary | ICD-10-CM | POA: Diagnosis not present

## 2019-05-28 NOTE — Therapy (Signed)
[place], [place], [address] Phone: [phone]   Fax:  [phone]  Physical Therapy Treatment  Patient Details  Name: Cody Velez MRN: [id number] Date of Birth: [birth year] Referring Provider (PT): Asencion Noble, MD   Encounter Date: 05/28/2019  PT End of Session - 05/28/19 1405    Visit Number  6    Number of Visits  9    Date for PT Re-Evaluation  06/13/19    Authorization Type  Primary: Medicare; Secondary: BCBS (75 visits per calendar year)    Authorization Time Period  08/15/18 - 10/14/18; 10/13/18 to 11/24/18; 11/22/18 - 01/03/19; 01/03/19 to 02/16/19; 01/24/19-03/16/19; NEW: 05/02/19-06/13/19    Authorization - Visit Number  11   starting 2020   Authorization - Number of Visits  59    PT Start Time  1314    PT Stop Time  1400    PT Time Calculation (min)  46 min    Equipment Utilized During Treatment  Gait belt    Activity Tolerance  Patient tolerated treatment well    Behavior During Therapy  WFL for tasks assessed/performed       Past Medical History:  Diagnosis Date  . Allergic rhinitis   . Arthritis   . B12 deficiency   . Cervicogenic headache 01/28/2016  . Childhood asthma   . Chronic back pain   . Coronary atherosclerosis of native coronary artery    Mild nonobstructive CAD at catheterization January 2015  . Depression   . Essential hypertension   . Falls   . GERD (gastroesophageal reflux disease)   . History of cerebrovascular disease 07/23/2015  . History of pneumonia 02/2011  . Hyperlipidemia   . Kidney stone   . Memory difficulty 07/23/2015  . OSA (obstructive sleep apnea)    CPAP - Dr. [name]  . Prostate cancer ([place])   . PTSD (post-traumatic stress disorder)    [place]  . Rectal bleeding     Past Surgical History:  Procedure Laterality Date  . APPLICATION OF ROBOTIC ASSISTANCE FOR SPINAL PROCEDURE N/A 03/28/2018   Procedure: APPLICATION OF ROBOTIC ASSISTANCE FOR SPINAL PROCEDURE;  Surgeon:  Kristeen Miss, MD;  Location: [facility];  Service: Neurosurgery;  Laterality: N/A;  . BACK SURGERY  02/14/12   lumbar OR #7; "today redid L1L2; replaced screws; added bone from hip"  . BILATERAL KNEE ARTHROSCOPY    . COLONOSCOPY  10/15/2008   Dr. [name]: tubular adenoma   . COLONOSCOPY  12/17/2003   NTI:RWERXV rectal and colon  . COLONOSCOPY N/A 09/05/2014   Procedure: COLONOSCOPY;  Surgeon: Daneil Dolin, MD;  [facility];  Service: Endoscopy;  Laterality: N/A;  7:30-rescheduled 9/17 to [place] notified pt  . CYSTOSCOPY WITH STENT PLACEMENT Right 01/27/2016   Procedure: CYSTOSCOPY WITH STENT PLACEMENT;  Surgeon: [name], MD;  Location: [facility];  Service: Urology;  Laterality: Right;  . CYSTOSCOPY/RETROGRADE/URETEROSCOPY/STONE EXTRACTION WITH BASKET Right 01/27/2016   Procedure: CYSTOSCOPY, RIGHT RETROGRADE, RIGHT URETEROSCOPY, STONE EXTRACTION ;  Surgeon: [name], MD;  Location: [facility];  Service: Urology;  Laterality: Right;  . ESOPHAGOGASTRODUODENOSCOPY  10/15/2008     Dr Rourk:Schatzki's ring status post dilation and disruption via 63 F Maloney dilator/ otherwise unremarkable esophagus, small hiatal hernia, multiple fundal gland polyps not manipulated, gastritis, negative H.pylori  . ESOPHAGOGASTRODUODENOSCOPY  06/21/02   Q[facility]:QQPYP sliding hiatal hernia with mild changes of reflux esophagitis limited to gastroesophageal junction.  Noncritical ring at distal esophagus, 3  cm proximal to gastroesophageal junction/Antral gastritis  . [place]   "broke face playing softball"  . FRACTURE SURGERY     "left wrist; broke it; took spur off"  . HOLMIUM LASER APPLICATION Right 07/24/1659   Procedure: HOLMIUM LASER APPLICATION;  Surgeon: [name], MD;  Location: [facility];  Service: Urology;  Laterality: Right;  . KNEE ARTHROSCOPY Right 05/18/2018   Procedure: PARTIAL MEDIAL MENISECTOMY AND SURGICAL LAVAGE AND CHONDROPLASTY;  Surgeon: [name], MD;  Location: [facility];  Service: Orthopedics;  Laterality: Right;  . LEFT HEART CATHETERIZATION WITH CORONARY ANGIOGRAM N/A 12/27/2013   Procedure: LEFT HEART CATHETERIZATION WITH CORONARY ANGIOGRAM;  Surgeon: [name], MD;  Location: [facility] CATH LAB;  Service: Cardiovascular;  Laterality: N/A;  . neck epidural    . POSTERIOR LUMBAR FUSION 4 LEVEL N/A 03/28/2018   Procedure: Thoracic eight -Lumbar two- FIXATION WITH SCREW PLACEMENT, DECOMPRESSION Thoracic ten-Thoracic eleven  FOR OSTEOMYELITIS;  Surgeon: Kristeen Miss, MD;  Location: [facility];  Service: Neurosurgery;  Laterality: N/A;  . SHOULDER SURGERY Bilateral   . TEE WITHOUT CARDIOVERSION N/A 03/21/2018   Procedure: TRANSESOPHAGEAL ECHOCARDIOGRAM (TEE) WITH PROPOFOL;  Surgeon: [name], MD;  [facility];  Service: Cardiovascular;  Laterality: N/A;    There were no vitals filed for this visit.  Subjective Assessment - 05/28/19 1326    Subjective  Patient reports he had a follow up with ID doctor this morning and it went well. He has been taken off of his antibiotics and will try to continue without them for some time. He also has an appointment with a dietician through the [place] tomorrow (virtual appointment) and seems eager for it and is hopeful he can work towards loosing weight.     Pertinent History  T10-11 Epidural Abscess, back surgery on 03/28/18, right knee partial medial menisectomy and chondroplasty    Limitations  Standing;Walking;House hold activities;Lifting    Diagnostic tests  CT Lumbar Spine 05/12/18: "Spinal stimulator device, along with multilevel multisegment fusion hardware appear appropriately placed without adverse features."    Patient Stated Goals  To walk    Currently in Pain?  No/denies       [facility] Adult PT Treatment/Exercise - 05/28/19 0001      Knee/Hip Exercises: Standing   Forward Step Up  Both;2 sets;10 reps;Step Height: 4"    Forward Step Up Limitations  min assist for LE placement to  position feet on step and prevent scissoring gait    Functional Squat  Limitations;10 reps    Functional Squat Limitations  split mini-squat, 1x10 reps bil      Knee/Hip Exercises: Supine   Bridges Limitations  LE on 12" box    Single Leg Bridge  Both;2 sets;10 reps      Knee/Hip Exercises: Prone   Other Prone Exercises  quadruped UE ext x10 reps each    Other Prone Exercises  quadruped LE ext 2x5 reps each       PT Education - 05/28/19 1404    Education Details  Discussed current status of aquatic therapy and that we are unable to get back into the pool until possible August or later. Encouraged pt to schedule time at the pool through their reserve system and this therapist will provide him with a aquatic HEP to use while at the pool. Reviewed importanc eof stretching daily.     Person(s) Educated  Patient    Methods  Explanation    Comprehension  Verbalized  understanding       PT Short Term Goals - 05/02/19 1245      PT SHORT TERM GOAL #1   Title  Patient will participate in HEP daily and will be able to instruct therapist in exercises for stretching and strengthening to improve mobility.    Time  3    Period  Weeks    Status  New    Target Date  05/23/19      PT SHORT TERM GOAL #2   Title  Patient will report or demonstrate ability to perform ambulation for 5 minutes with rollator with no more than supervision assistance without rest break and reported no more than minimal difficulty.     Time  3    Period  Weeks    Status  New        PT Long Term Goals - 05/02/19 1250      PT LONG TERM GOAL #1   Title  Patient will report or demonstrate ability to perform ambulation for 8 minutes with LRAD with no more than supervision assistance without rest break and reported no more than minimal difficulty.     Time  6    Period  Weeks    Status  New    Target Date  06/13/19      PT LONG TERM GOAL #2   Title  Patient will demonstrate ability to stand at a support surface for  10 minutes without a rest break in order to perform household chores and ADL's with increased ease.     Time  6    Period  Weeks    Status  New      PT LONG TERM GOAL #3   Title  Patient will demonstrate ability to ambulate 120 feet without rest break using LRAD with no more than minimal assistance.    Time  6    Period  Weeks    Status  New      PT LONG TERM GOAL #4   Title  Patient will be participating in exercise program at home or [facility], 40-60 minutes of moderate aerobic exercise in addition to therapy, to improve health/wellness in order to lose weight for improved mobility and function.    Time  6    Period  Weeks    Status  New      PT LONG TERM GOAL #5   Title  Patient will demonstrate safe transfer to/from arm bike at home to be able to safely exercise for weight loss program to improve health/wellness and increase physical activity at home.    Time  6    Period  Weeks    Status  New        Plan - 05/28/19 1406    Clinical Impression Statement  Continued with funcitonal strengthening this session in split stance position to prgres stwoards safe floor to stand transfers. Patient initiated split squat exercises with Bil UE support in // bars and he had greater difficulty with Lt knee back. He fatigued quickly with this exercise requiring a seated break halfway through second set. He had difficulty extending knees fully during standing exercises and hamstring stretch performed today. Patient continued quadruped exercises today and complained of wrist/hand pain rather than knee pain this session. He was encouraged to sign up for reserved pool time at [facility] as he is a member and I will provide him with an aquatic HEP next session for him to use independently. He will benefit from  ongoing skilled PT interventions to improve mobility.     Rehab Potential  Fair    Clinical Impairments Affecting Rehab Potential  Positive: Highly motivated; Negative: Comorbidities    PT Frequency  2x /  week   start at 1x/week and may increase to 2 during last 3 weeks   PT Duration  6 weeks    PT Treatment/Interventions  ADLs/Self Care Home Management;Aquatic Therapy;DME Instruction;Gait training;Functional mobility training;Stair training;Therapeutic activities;Therapeutic exercise;Balance training;Neuromuscular re-education;Patient/family education;Orthotic Fit/Training;Wheelchair mobility training;Manual techniques;Passive range of motion;Energy conservation;Dry needling;Splinting;Taping    PT Next Visit Plan  add lat pulldowns and rowing for back strengthening; consider working on floor to stand transfers with soft mat as pt reports he gets himself up from the floor. continue educate on importance of regular moderate aerobic exercise to improve health and promote weight loss. Review importance of strengthening and stretching at home and of stretching for improve posture and gait quality. Use machine-based strengthening to improve patient's independence with using equipment at gym. Continue tricep press ups    PT Home Exercise Plan  08/16/18:  seated DF/PF, LAQ, marching, abd with theraband and bridge; 11/13: HS and calf stretching (says he has been performing this at home); 01/24/2019 = prone hip flexor stretch; 3/12: eccentric STS    Consulted and Agree with Plan of Care  Patient       Patient will benefit from skilled therapeutic intervention in order to improve the following deficits and impairments:  Abnormal gait, Decreased balance, Decreased endurance, Decreased mobility, Difficulty walking, Hypomobility, Decreased range of motion, Decreased activity tolerance, Decreased strength, Impaired flexibility  Visit Diagnosis: Other abnormalities of gait and mobility  Other symptoms and signs involving the musculoskeletal system  Muscle weakness (generalized)     Problem List Patient Active Problem List   Diagnosis Date Noted  . Pseudogout of knee   . Pyogenic arthritis of right knee joint  ([place])   . Falls frequently 05/08/2018  . Fall   . Sleep disturbance   . Post-op pain   . Hypokalemia   . Morbid obesity ([place])   . Postoperative pain   . DVT, lower extremity, distal, acute, bilateral ([place])   . Acute blood loss anemia   . Anemia of chronic disease   . Essential hypertension   . Bacteremia   . Myelopathy ([place]) 03/30/2018  . Paraplegia ([place])   . Postlaminectomy syndrome, lumbar region   . Epidural abscess   . Abdominal distension   . Encephalopathy   . Weakness of both lower extremities   . Discitis of thoracic region   . Spinal cord stimulator status   . Staphylococcus aureus sepsis ([place]) 03/20/2018  . Bacteremia due to methicillin susceptible Staphylococcus aureus (MSSA) 03/18/2018  . Obesity 03/18/2018  . Nephrolithiasis 03/16/2018  . AKI (acute kidney injury) ([place]) 03/16/2018  . Cognitive impairment 03/16/2018  . Chronic pain syndrome 03/16/2018  . B12 deficiency 01/28/2016  . Memory difficulty 07/23/2015  . History of cerebrovascular disease 07/23/2015  . FH: colon cancer 08/13/2014  . Hx of adenomatous colonic polyps 08/13/2014  . Chronic back pain 05/30/2012  . CAD, NATIVE VESSEL 07/17/2010  . HYPERCHOLESTEROLEMIA 10/31/2009  . SMOKELESS TOBACCO ABUSE 10/31/2009  . Obstructive sleep apnea 10/31/2009  . Essential hypertension, benign 10/31/2009  . GERD 10/31/2009    Kipp Brood, PT, DPT, [facility] Physical Therapist with [facility]  05/28/2019 2:12 PM    [place], [place], [address] Phone: [phone]  Fax:  [phone]  Name: Cody Velez MRN: [id number] Date of Birth: November 01, 1946

## 2019-05-28 NOTE — Progress Notes (Signed)
[facility] Infectious [facility]  Patient Active Problem List   Diagnosis Date Noted  . Pseudogout of knee     Priority: High  . Discitis of thoracic region     Priority: High  . Bacteremia due to methicillin susceptible Staphylococcus aureus (MSSA) 03/18/2018    Priority: High  . Pyogenic arthritis of right knee joint ([place])   . Falls frequently 05/08/2018  . Fall   . Sleep disturbance   . Post-op pain   . Hypokalemia   . Morbid obesity ([place])   . Postoperative pain   . DVT, lower extremity, distal, acute, bilateral ([place])   . Acute blood loss anemia   . Anemia of chronic disease   . Essential hypertension   . Bacteremia   . Myelopathy ([place]) 03/30/2018  . Paraplegia ([place])   . Postlaminectomy syndrome, lumbar region   . Epidural abscess   . Abdominal distension   . Encephalopathy   . Weakness of both lower extremities   . Spinal cord stimulator status   . Staphylococcus aureus sepsis ([place]) 03/20/2018  . Obesity 03/18/2018  . Nephrolithiasis 03/16/2018  . AKI (acute kidney injury) ([place]) 03/16/2018  . Cognitive impairment 03/16/2018  . Chronic pain syndrome 03/16/2018  . B12 deficiency 01/28/2016  . Memory difficulty 07/23/2015  . History of cerebrovascular disease 07/23/2015  . FH: colon cancer 08/13/2014  . Hx of adenomatous colonic polyps 08/13/2014  . Chronic back pain 05/30/2012  . CAD, NATIVE VESSEL 07/17/2010  . HYPERCHOLESTEROLEMIA 10/31/2009  . SMOKELESS TOBACCO ABUSE 10/31/2009  . Obstructive sleep apnea 10/31/2009  . Essential hypertension, benign 10/31/2009  . GERD 10/31/2009    Patient's Medications  New Prescriptions   No medications on file  Previous Medications   AMLODIPINE (NORVASC) 10 MG TABLET    Take 1 tablet (10 mg total) by mouth daily.   ASPIRIN 81 MG TABLET    Take 1 tablet (81 mg total) by mouth daily.   BACLOFEN (LIORESAL) 10 MG TABLET    TAKE ONE TABLET IN THE MORNING AND MIDDAY, THEN TWO TABLETS AT NIGHT   BACLOFEN 5  MG TABS    Take 5 mg by mouth 3 (three) times daily.   COLCHICINE 0.6 MG TABLET    Take 1 tablet (0.6 mg total) by mouth daily.   CYCLOSPORINE (RESTASIS) 0.05 % OPHTHALMIC EMULSION    1 DROP INTO BOTH EYES TWICE A DAY FOR DRY EYES   DALTEPARIN SODIUM (FRAGMIN) 63016 UNT/0.72ML SOLN    Inject 0.72 mLs into the skin.   DIAZEPAM (VALIUM) 5 MG TABLET    Take 1 tablet (5 mg total) by mouth every 6 (six) hours as needed for muscle spasms.   DOCUSATE SODIUM (COLACE) 100 MG CAPSULE    Take 2 capsules (200 mg total) by mouth 2 (two) times daily.   DONEPEZIL (ARICEPT) 10 MG TABLET    Take 1 tablet (10 mg total) by mouth at bedtime.   FINASTERIDE (PROSCAR) 5 MG TABLET    Take 1 tablet (5 mg total) by mouth daily. Reported on 05/04/2016   FLUTICASONE (FLONASE) 50 MCG/ACT NASAL SPRAY    Place 1 spray into both nostrils daily as needed for allergies.   GABAPENTIN (NEURONTIN) 400 MG CAPSULE    Take 1 capsule (400 mg total) by mouth 3 (three) times daily.   HYDROCHLOROTHIAZIDE (HYDRODIURIL) 25 MG TABLET       LATANOPROST (XALATAN) 0.005 % OPHTHALMIC SOLUTION    Place 1  drop into both eyes at bedtime.   LOSARTAN (COZAAR) 100 MG TABLET    Take 100 mg by mouth daily.   MELATONIN 3 MG TABS    Take 0.5 tablets (1.5 mg total) by mouth at bedtime.   MEMANTINE (NAMENDA) 10 MG TABLET    Take 1 tablet (10 mg total) by mouth 2 (two) times daily.   OMEPRAZOLE (PRILOSEC) 20 MG CAPSULE    Take 2 capsules (40 mg total) by mouth daily.   OXYCODONE HCL 10 MG TABS    Take 1 tablet (10 mg total) by mouth every 4 (four) hours as needed (moderate to severe pain).   POLYETHYLENE GLYCOL (MIRALAX / GLYCOLAX) PACKET    Take 17 g by mouth daily.   POTASSIUM CHLORIDE SA (K-DUR,KLOR-CON) 20 MEQ TABLET    Take 1 tablet (20 mEq total) by mouth daily.   PRAVASTATIN (PRAVACHOL) 10 MG TABLET    Take 1 tablet (10 mg total) by mouth every evening.   TRAZODONE (DESYREL) 100 MG TABLET    Take 1 tablet (100 mg total) by mouth at bedtime.    VENLAFAXINE XR (EFFEXOR-XR) 150 MG 24 HR CAPSULE    Take 1 capsule (150 mg total) by mouth 2 (two) times daily.   VITAMIN B-12 (CYANOCOBALAMIN) 1000 MCG TABLET    Take 1 tablet (1,000 mcg total) by mouth daily.  Modified Medications   No medications on file  Discontinued Medications   CEPHALEXIN (KEFLEX) 500 MG CAPSULE    TAKE ONE CAPSULE (500MG  TOTAL) BY MOUTH THREE TIMES DAILY    Subjective: Cody Velez is in for his routine follow-up visit.  In March of last year he developed MSSA bacteremia complicated by thoracic discitis and lower extremity weakness.  He underwent incision and drainage and thoracolumbar fusion by Dr. Ellene Route.  He completed a long course of IV antibiotics and then converted to oral cephalexin and rifampin.  I had him stop taking rifampin 6 months ago.  He has now completed 15 months of therapy.  He denies any problems tolerating his antibiotics.  He feels like he is making significant progress with his physical therapy.  He has noted more discomfort in his mid back over the past month.  He is rarely taking any narcotic pain medication but he does take acetaminophen.    He was [facility] at the [facility] recently and had a CT scan done of his neck.  He does not know the results of the scan yet.  He says that someone also talked to him about doing a scan of his lumbar spine to look at the implanted stimulator leads to see if they might have broken last year.  He says that his memory is terrible and he is not sure if the lumbar scan was ever done.  He says that Dr. Ellene Route did x-rays in his office recently and said that everything was looking very good.  Review of Systems: Review of Systems  Constitutional: Negative for chills, diaphoresis and fever.  Gastrointestinal: Negative for abdominal pain, diarrhea, nausea and vomiting.  Musculoskeletal: Positive for back pain.  Neurological: Positive for sensory change and focal weakness.    Past Medical History:  Diagnosis Date  . Allergic  rhinitis   . Arthritis   . B12 deficiency   . Cervicogenic headache 01/28/2016  . Childhood asthma   . Chronic back pain   . Coronary atherosclerosis of native coronary artery    Mild nonobstructive CAD at catheterization January 2015  . Depression   .  Essential hypertension   . Falls   . GERD (gastroesophageal reflux disease)   . History of cerebrovascular disease 07/23/2015  . History of pneumonia 02/2011  . Hyperlipidemia   . Kidney stone   . Memory difficulty 07/23/2015  . OSA (obstructive sleep apnea)    CPAP - Dr. [name]  . Prostate cancer ([place])   . PTSD (post-traumatic stress disorder)    [place]  . Rectal bleeding     Social History   Tobacco Use  . Smoking status: Former Smoker    Types: Cigarettes    Last attempt to quit: 12/20/1958    Years since quitting: 60.4  . Smokeless tobacco: Current User    Types: Chew  . Tobacco comment: Quit chewing 04/2015  Substance Use Topics  . Alcohol use: Yes    Alcohol/week: 0.0 standard drinks    Comment: couple bottles of wine per month  . Drug use: No    Types: Marijuana    Comment: "last used marijuana ~ 1969"    Family History  Problem Relation Age of Onset  . Emphysema Father   . Heart failure Father   . Lung cancer Father   . CAD Father   . Colon cancer Mother   . Stroke Mother   . Breast cancer Mother   . Stroke Sister   . Heart attack Brother   . Dementia Paternal Uncle   . Emphysema Maternal Grandmother   . Stroke Maternal Grandmother   . Asthma Other        grandson  . Heart disease Paternal Grandfather   . Anesthesia problems Neg Hx   . Hypotension Neg Hx   . Malignant hyperthermia Neg Hx   . Pseudochol deficiency Neg Hx     No Known Allergies  Objective: Vitals:   05/28/19 0953  BP: 124/76  Pulse: 71  Temp: 98.2 F (36.8 C)  TempSrc: Oral  SpO2: 95%  Weight: (!) 313 lb (142 kg)  Height: 6\' 3"  (1.905 m)   Body mass index is 39.12 kg/m.  Physical Exam Constitutional:      Comments: He  is in very good spirits as usual.  He is seated in his wheelchair.  He has gained 36 pounds in the past year.  Abdominal:     General: There is no distension.     Palpations: Abdomen is soft.     Tenderness: There is no abdominal tenderness.  Musculoskeletal:     Comments: His back incision is well-healed.  There are no signs of inflammation or infection over his spine or the left-sided stimulator.  There are no new bony changes.  Skin:    Findings: No rash.  Neurological:     Mental Status: He is alert and oriented to person, place, and time.     Lab Results Sed Rate (mm/h)  Date Value  01/18/2019 17  08/30/2018 19  07/18/2018 33 (H)   CRP (mg/L)  Date Value  01/18/2019 7.2  08/30/2018 2.7  07/18/2018 7.0     Problem List Items Addressed This Visit      High   Discitis of thoracic region    Overall he is much improved over the past year but he feels like he has stumbled in his physical therapy.  His dramatic weight gain is probably not helping his progress.  He has a C-reactive protein and sed rate have normalized.  He says that he is ready to stop cephalexin now and see what happens.  He will follow-up  in 3 months.          Michel Bickers, MD [facility] [phone] pager   [phone] cell 05/28/2019, 10:21 AM

## 2019-05-28 NOTE — Assessment & Plan Note (Signed)
Overall he is much improved over the past year but he feels like he has stumbled in his physical therapy.  His dramatic weight gain is probably not helping his progress.  He has a C-reactive protein and sed rate have normalized.  He says that he is ready to stop cephalexin now and see what happens.  He will follow-up in 3 months.

## 2019-05-30 ENCOUNTER — Encounter (HOSPITAL_COMMUNITY): Payer: Self-pay

## 2019-05-30 ENCOUNTER — Other Ambulatory Visit: Payer: Self-pay

## 2019-05-30 ENCOUNTER — Ambulatory Visit (HOSPITAL_COMMUNITY): Payer: Medicare Other

## 2019-05-30 DIAGNOSIS — R29898 Other symptoms and signs involving the musculoskeletal system: Secondary | ICD-10-CM

## 2019-05-30 DIAGNOSIS — R2689 Other abnormalities of gait and mobility: Secondary | ICD-10-CM | POA: Diagnosis not present

## 2019-05-30 DIAGNOSIS — M6281 Muscle weakness (generalized): Secondary | ICD-10-CM

## 2019-05-30 NOTE — Therapy (Signed)
[place], [place], [address] Phone: [phone]   Fax:  [phone]  Physical Therapy Treatment  Patient Details  Name: ADRICK KESTLER MRN: [id number] Date of Birth: June 10, 1946 Referring Provider (PT): Asencion Noble, MD   Encounter Date: 05/30/2019  PT End of Session - 05/30/19 1010    Visit Number  7    Number of Visits  9    Date for PT Re-Evaluation  06/13/19    Authorization Type  Primary: Medicare; Secondary: BC[employer] (75 visits per calendar year)    Authorization Time Period  08/15/18 - 10/14/18; 10/13/18 to 11/24/18; 11/22/18 - 01/03/19; 01/03/19 to 02/16/19; 01/24/19-03/16/19; NEW: 05/02/19-06/13/19    Authorization - Visit Number  24   starting 2020   Authorization - Number of Visits  57    PT Start Time  1012    PT Stop Time  1055    PT Time Calculation (min)  43 min    Equipment Utilized During Treatment  Gait belt    Activity Tolerance  Patient tolerated treatment well    Behavior During Therapy  WFL for tasks assessed/performed       Past Medical History:  Diagnosis Date  . Allergic rhinitis   . Arthritis   . B12 deficiency   . Cervicogenic headache 01/28/2016  . Childhood asthma   . Chronic back pain   . Coronary atherosclerosis of native coronary artery    Mild nonobstructive CAD at catheterization January 2015  . Depression   . Essential hypertension   . Falls   . GERD (gastroesophageal reflux disease)   . History of cerebrovascular disease 07/23/2015  . History of pneumonia 02/2011  . Hyperlipidemia   . Kidney stone   . Memory difficulty 07/23/2015  . OSA (obstructive sleep apnea)    CPAP - Dr. [name]  . Prostate cancer ([place])   . PTSD (post-traumatic stress disorder)    [place]  . Rectal bleeding     Past Surgical History:  Procedure Laterality Date  . APPLICATION OF ROBOTIC ASSISTANCE FOR SPINAL PROCEDURE N/A 03/28/2018   Procedure: APPLICATION OF ROBOTIC ASSISTANCE FOR SPINAL PROCEDURE;  Surgeon:  Kristeen Miss, MD;  Location: [facility];  Service: Neurosurgery;  Laterality: N/A;  . BACK SURGERY  02/14/12   lumbar OR #7; "today redid L1L2; replaced screws; added bone from hip"  . BILATERAL KNEE ARTHROSCOPY    . COLONOSCOPY  10/15/2008   Dr. [name]: tubular adenoma   . COLONOSCOPY  12/17/2003   JSE:GBTDVV rectal and colon  . COLONOSCOPY N/A 09/05/2014   Procedure: COLONOSCOPY;  Surgeon: Daneil Dolin, MD;  [facility];  Service: Endoscopy;  Laterality: N/A;  7:30-rescheduled 9/17 to [place] notified pt  . CYSTOSCOPY WITH STENT PLACEMENT Right 01/27/2016   Procedure: CYSTOSCOPY WITH STENT PLACEMENT;  Surgeon: [name], MD;  Location: [facility];  Service: Urology;  Laterality: Right;  . CYSTOSCOPY/RETROGRADE/URETEROSCOPY/STONE EXTRACTION WITH BASKET Right 01/27/2016   Procedure: CYSTOSCOPY, RIGHT RETROGRADE, RIGHT URETEROSCOPY, STONE EXTRACTION ;  Surgeon: [name], MD;  Location: [facility];  Service: Urology;  Laterality: Right;  . ESOPHAGOGASTRODUODENOSCOPY  10/15/2008     Dr Rourk:Schatzki's ring status post dilation and disruption via 3 F Maloney dilator/ otherwise unremarkable esophagus, small hiatal hernia, multiple fundal gland polyps not manipulated, gastritis, negative H.pylori  . ESOPHAGOGASTRODUODENOSCOPY  06/21/02   [facility]:WVPXT sliding hiatal hernia with mild changes of reflux esophagitis limited to gastroesophageal junction.  Noncritical ring at distal esophagus, 3  cm proximal to gastroesophageal junction/Antral gastritis  . [place]   "broke face playing softball"  . FRACTURE SURGERY     "left wrist; broke it; took spur off"  . HOLMIUM LASER APPLICATION Right 03/22/5955   Procedure: HOLMIUM LASER APPLICATION;  Surgeon: [name], MD;  Location: [facility];  Service: Urology;  Laterality: Right;  . KNEE ARTHROSCOPY Right 05/18/2018   Procedure: PARTIAL MEDIAL MENISECTOMY AND SURGICAL LAVAGE AND CHONDROPLASTY;  Surgeon: [name], MD;  Location: [facility];  Service: Orthopedics;  Laterality: Right;  . LEFT HEART CATHETERIZATION WITH CORONARY ANGIOGRAM N/A 12/27/2013   Procedure: LEFT HEART CATHETERIZATION WITH CORONARY ANGIOGRAM;  Surgeon: [name], MD;  Location: [facility];  Service: Cardiovascular;  Laterality: N/A;  . neck epidural    . POSTERIOR LUMBAR FUSION 4 LEVEL N/A 03/28/2018   Procedure: Thoracic eight -Lumbar two- FIXATION WITH SCREW PLACEMENT, DECOMPRESSION Thoracic ten-Thoracic eleven  FOR OSTEOMYELITIS;  Surgeon: Kristeen Miss, MD;  Location: [facility];  Service: Neurosurgery;  Laterality: N/A;  . SHOULDER SURGERY Bilateral   . TEE WITHOUT CARDIOVERSION N/A 03/21/2018   Procedure: TRANSESOPHAGEAL ECHOCARDIOGRAM (TEE) WITH PROPOFOL;  Surgeon: [name], MD;  [facility];  Service: Cardiovascular;  Laterality: N/A;    There were no vitals filed for this visit.  Subjective Assessment - 05/30/19 1104    Subjective  Pt states that his knees hurt at the front. Thinks it may have been the way he slept. Slept the same way Sunday night and had the same pain Monday. Spoke to nutritionist and she was very helpful.    Pertinent History  T10-11 Epidural Abscess, back surgery on 03/28/18, right knee partial medial menisectomy and chondroplasty    Limitations  Standing;Walking;House hold activities;Lifting    Diagnostic tests  CT Lumbar Spine 05/12/18: "Spinal stimulator device, along with multilevel multisegment fusion hardware appear appropriately placed without adverse features."    Patient Stated Goals  To walk    Currently in Pain?  Yes    Pain Score  4     Pain Location  Knee    Pain Orientation  Right;Left;Anterior    Pain Descriptors / Indicators  Sore    Pain Type  Chronic pain    Pain Onset  More than a month ago    Pain Frequency  Intermittent    Aggravating Factors   pt think it is from how he slept    Pain Relieving Factors  unsure    Effect of Pain on Daily Activities  minimal            [facility] Adult PT Treatment/Exercise - 05/30/19 0001      Ambulation/Gait   Ambulation/Gait  Yes    Ambulation/Gait Assistance  4: Min guard    Ambulation/Gait Assistance Details  w/c follow for safety    Ambulation Distance (Feet)  80 Feet   x23ft, x69ft   Assistive device  Rolling walker    Gait Pattern  Decreased dorsiflexion - left;Decreased dorsiflexion - right;Trunk flexed;Decreased step length - right;Decreased step length - left;Step-through pattern      Knee/Hip Exercises: Stretches   Passive Hamstring Stretch  Both;3 reps;30 seconds    Passive Hamstring Stretch Limitations  manual in supine    Quad Stretch  Both;3 reps;30 seconds    Quad Stretch Limitations  manual in prone      Knee/Hip Exercises: Machines for Strengthening   Hip Cybex  seated rows (with straight bar) 50# 2x15reps  Other Machine  lat pulldowns 30#, 2x15reps           PT Education - 05/30/19 1105    Education Details  rehabr progression is up and down with good and bad days -- provided aquatic HEP     Person(s) Educated  Patient    Methods  Explanation;Handout    Comprehension  Verbalized understanding;Returned demonstration       PT Short Term Goals - 05/02/19 1245      PT SHORT TERM GOAL #1   Title  Patient will participate in HEP daily and will be able to instruct therapist in exercises for stretching and strengthening to improve mobility.    Time  3    Period  Weeks    Status  New    Target Date  05/23/19      PT SHORT TERM GOAL #2   Title  Patient will report or demonstrate ability to perform ambulation for 5 minutes with rollator with no more than supervision assistance without rest break and reported no more than minimal difficulty.     Time  3    Period  Weeks    Status  New        PT Long Term Goals - 05/02/19 1250      PT LONG TERM GOAL #1   Title  Patient will report or demonstrate ability to perform ambulation for 8 minutes with LRAD with no more than  supervision assistance without rest break and reported no more than minimal difficulty.     Time  6    Period  Weeks    Status  New    Target Date  06/13/19      PT LONG TERM GOAL #2   Title  Patient will demonstrate ability to stand at a support surface for 10 minutes without a rest break in order to perform household chores and ADL's with increased ease.     Time  6    Period  Weeks    Status  New      PT LONG TERM GOAL #3   Title  Patient will demonstrate ability to ambulate 120 feet without rest break using LRAD with no more than minimal assistance.    Time  6    Period  Weeks    Status  New      PT LONG TERM GOAL #4   Title  Patient will be participating in exercise program at home or [facility], 40-60 minutes of moderate aerobic exercise in addition to therapy, to improve health/wellness in order to lose weight for improved mobility and function.    Time  6    Period  Weeks    Status  New      PT LONG TERM GOAL #5   Title  Patient will demonstrate safe transfer to/from arm bike at home to be able to safely exercise for weight loss program to improve health/wellness and increase physical activity at home.    Time  6    Period  Weeks    Status  New            Plan - 05/30/19 1103    Clinical Impression Statement  Session limited due to increased leg pain and pt reporting feeling more tired today than usual. Began with gait which pt limited to 33ft this date due to fatigue and BLE pain (anterior knee). Began lat pulldowns and rowing at multigym for back strengthening; can increase weight some next visit. Ended with  manual HS and quad stretching to help facilitate reduced leg pain. Provided pt with laminated handout of aquatic HEP (from Kipp Brood, PT, DPT) for pt to take to [facility] when he rents out a lane; reviewed with pt with no questions. Pt educated that rehab process is up and down and not all days are good and encouraged him to keep this in mind when he is having a bad  day.     Rehab Potential  Fair    Clinical Impairments Affecting Rehab Potential  Positive: Highly motivated; Negative: Comorbidities    PT Frequency  2x / week   start at 1x/week and may increase to 2 during last 3 weeks   PT Duration  6 weeks    PT Treatment/Interventions  ADLs/Self Care Home Management;Aquatic Therapy;DME Instruction;Gait training;Functional mobility training;Stair training;Therapeutic activities;Therapeutic exercise;Balance training;Neuromuscular re-education;Patient/family education;Orthotic Fit/Training;Wheelchair mobility training;Manual techniques;Passive range of motion;Energy conservation;Dry needling;Splinting;Taping    PT Next Visit Plan  continue lat pulldowns and rowing for back strengthening; consider working on floor to stand transfers with soft mat as pt reports he gets himself up from the floor. continue educate on importance of regular moderate aerobic exercise to improve health and promote weight loss. Review importance of strengthening and stretching at home and of stretching for improve posture and gait quality. Use machine-based strengthening to improve patient's independence with using equipment at gym. Continue tricep press ups    PT Home Exercise Plan  08/16/18:  seated DF/PF, LAQ, marching, abd with theraband and bridge; 11/13: HS and calf stretching (says he has been performing this at home); 01/24/2019 = prone hip flexor stretch; 3/12: eccentric STS; 6/10: aquatic HEP routine    Consulted and Agree with Plan of Care  Patient       Patient will benefit from skilled therapeutic intervention in order to improve the following deficits and impairments:  Abnormal gait, Decreased balance, Decreased endurance, Decreased mobility, Difficulty walking, Hypomobility, Decreased range of motion, Decreased activity tolerance, Decreased strength, Impaired flexibility  Visit Diagnosis: Other abnormalities of gait and mobility  Other symptoms and signs involving the  musculoskeletal system  Muscle weakness (generalized)     Problem List Patient Active Problem List   Diagnosis Date Noted  . Pseudogout of knee   . Pyogenic arthritis of right knee joint ([place])   . Falls frequently 05/08/2018  . Fall   . Sleep disturbance   . Post-op pain   . Hypokalemia   . Morbid obesity ([place])   . Postoperative pain   . DVT, lower extremity, distal, acute, bilateral ([place])   . Acute blood loss anemia   . Anemia of chronic disease   . Essential hypertension   . Bacteremia   . Myelopathy ([place]) 03/30/2018  . Paraplegia ([place])   . Postlaminectomy syndrome, lumbar region   . Epidural abscess   . Abdominal distension   . Encephalopathy   . Weakness of both lower extremities   . Discitis of thoracic region   . Spinal cord stimulator status   . Staphylococcus aureus sepsis ([place]) 03/20/2018  . Bacteremia due to methicillin susceptible Staphylococcus aureus (MSSA) 03/18/2018  . Obesity 03/18/2018  . Nephrolithiasis 03/16/2018  . AKI (acute kidney injury) ([place]) 03/16/2018  . Cognitive impairment 03/16/2018  . Chronic pain syndrome 03/16/2018  . B12 deficiency 01/28/2016  . Memory difficulty 07/23/2015  . History of cerebrovascular disease 07/23/2015  . FH: colon cancer 08/13/2014  . Hx of adenomatous colonic polyps 08/13/2014  . Chronic back pain 05/30/2012  .  CAD, NATIVE VESSEL 07/17/2010  . HYPERCHOLESTEROLEMIA 10/31/2009  . SMOKELESS TOBACCO ABUSE 10/31/2009  . Obstructive sleep apnea 10/31/2009  . Essential hypertension, benign 10/31/2009  . GERD 10/31/2009       Geraldine Solar PT, DPT    [facility] [address] [place], [place], [address] Phone: [phone]   Fax:  [phone]  Name: PHAT DALTON MRN: [id number] Date of Birth: [birth year]

## 2019-05-31 NOTE — Telephone Encounter (Signed)
I called and spoke to fill today.  He said that he has been feeling weaker for the past 3 to 4 weeks.  Yesterday he was too weak to complete his physical therapy.  Today he had some transient nausea.  He has not had any fever and he says that his blood pressure has been normal.  He wanted to know if this could be related to stopping his antibiotic 3 days ago.  I told him that I did not think this had anything to do with stopping the antibiotic.  I am also not convinced that this is related to the chronic infection that he was being treated for.  He has placed a call to Dr. Willey Blade, his PCP, and is waiting on a call back.  I asked him to let us know how he is doing within the next week.

## 2019-06-01 ENCOUNTER — Encounter: Payer: Self-pay | Admitting: Cardiology

## 2019-06-01 DIAGNOSIS — R531 Weakness: Secondary | ICD-10-CM | POA: Diagnosis not present

## 2019-06-01 DIAGNOSIS — R112 Nausea with vomiting, unspecified: Secondary | ICD-10-CM | POA: Diagnosis not present

## 2019-06-04 DIAGNOSIS — M4644 Discitis, unspecified, thoracic region: Secondary | ICD-10-CM | POA: Diagnosis not present

## 2019-06-04 DIAGNOSIS — I10 Essential (primary) hypertension: Secondary | ICD-10-CM | POA: Diagnosis not present

## 2019-06-04 DIAGNOSIS — Z6839 Body mass index (BMI) 39.0-39.9, adult: Secondary | ICD-10-CM | POA: Diagnosis not present

## 2019-06-05 ENCOUNTER — Ambulatory Visit (INDEPENDENT_AMBULATORY_CARE_PROVIDER_SITE_OTHER): Payer: Medicare Other | Admitting: Urology

## 2019-06-05 ENCOUNTER — Ambulatory Visit (HOSPITAL_COMMUNITY): Payer: Medicare Other

## 2019-06-05 DIAGNOSIS — R350 Frequency of micturition: Secondary | ICD-10-CM

## 2019-06-05 DIAGNOSIS — N401 Enlarged prostate with lower urinary tract symptoms: Secondary | ICD-10-CM

## 2019-06-05 DIAGNOSIS — C61 Malignant neoplasm of prostate: Secondary | ICD-10-CM

## 2019-06-07 ENCOUNTER — Other Ambulatory Visit: Payer: Self-pay

## 2019-06-07 ENCOUNTER — Ambulatory Visit (HOSPITAL_COMMUNITY): Payer: Medicare Other

## 2019-06-07 ENCOUNTER — Encounter (HOSPITAL_COMMUNITY): Payer: Self-pay

## 2019-06-07 DIAGNOSIS — R29898 Other symptoms and signs involving the musculoskeletal system: Secondary | ICD-10-CM | POA: Diagnosis not present

## 2019-06-07 DIAGNOSIS — M6281 Muscle weakness (generalized): Secondary | ICD-10-CM

## 2019-06-07 DIAGNOSIS — R2689 Other abnormalities of gait and mobility: Secondary | ICD-10-CM

## 2019-06-07 NOTE — Therapy (Signed)
[place], [place], [address] Phone: [phone]   Fax:  [phone]  Physical Therapy Treatment  Patient Details  Name: Cody Velez MRN: [id number] Date of Birth: 21-Mar-1946 Referring Provider (PT): Asencion Noble, MD   Encounter Date: 06/07/2019  PT End of Session - 06/07/19 1313    Visit Number  8    Number of Visits  9    Date for PT Re-Evaluation  06/13/19    Authorization Type  Primary: Medicare; Secondary: [employer] (75 visits per calendar year)    Authorization Time Period  08/15/18 - 10/14/18; 10/13/18 to 11/24/18; 11/22/18 - 01/03/19; 01/03/19 to 02/16/19; 01/24/19-03/16/19; NEW: 05/02/19-06/13/19    Authorization - Visit Number  64   starting 2020   Authorization - Number of Visits  43    PT Start Time  9381    PT Stop Time  1400    PT Time Calculation (min)  47 min    Equipment Utilized During Treatment  Gait belt    Activity Tolerance  Patient tolerated treatment well    Behavior During Therapy  WFL for tasks assessed/performed       Past Medical History:  Diagnosis Date  . Allergic rhinitis   . Arthritis   . B12 deficiency   . Cervicogenic headache 01/28/2016  . Childhood asthma   . Chronic back pain   . Coronary atherosclerosis of native coronary artery    Mild nonobstructive CAD at catheterization January 2015  . Depression   . Essential hypertension   . Falls   . GERD (gastroesophageal reflux disease)   . History of cerebrovascular disease 07/23/2015  . History of pneumonia 02/2011  . Hyperlipidemia   . Kidney stone   . Memory difficulty 07/23/2015  . OSA (obstructive sleep apnea)    CPAP - Dr. [name]  . Prostate cancer ([place])   . PTSD (post-traumatic stress disorder)    [place]  . Rectal bleeding     Past Surgical History:  Procedure Laterality Date  . APPLICATION OF ROBOTIC ASSISTANCE FOR SPINAL PROCEDURE N/A 03/28/2018   Procedure: APPLICATION OF ROBOTIC ASSISTANCE FOR SPINAL PROCEDURE;  Surgeon:  Kristeen Miss, MD;  Location: [facility];  Service: Neurosurgery;  Laterality: N/A;  . BACK SURGERY  02/14/12   lumbar OR #7; "today redid L1L2; replaced screws; added bone from hip"  . BILATERAL KNEE ARTHROSCOPY    . COLONOSCOPY  10/15/2008   Dr. [name]: tubular adenoma   . COLONOSCOPY  12/17/2003   OFB:PZWCHE rectal and colon  . COLONOSCOPY N/A 09/05/2014   Procedure: COLONOSCOPY;  Surgeon: Daneil Dolin, MD;  [facility];  Service: Endoscopy;  Laterality: N/A;  7:30-rescheduled 9/17 to [place] notified pt  . CYSTOSCOPY WITH STENT PLACEMENT Right 01/27/2016   Procedure: CYSTOSCOPY WITH STENT PLACEMENT;  Surgeon: [name], MD;  [facility]: [facility];  Service: Urology;  Laterality: Right;  . CYSTOSCOPY/RETROGRADE/URETEROSCOPY/STONE EXTRACTION WITH BASKET Right 01/27/2016   Procedure: CYSTOSCOPY, RIGHT RETROGRADE, RIGHT URETEROSCOPY, STONE EXTRACTION ;  Surgeon: [name], MD;  Location: [facility];  Service: Urology;  Laterality: Right;  . ESOPHAGOGASTRODUODENOSCOPY  10/15/2008     Dr Rourk:Schatzki's ring status post dilation and disruption via 45 F Maloney dilator/ otherwise unremarkable esophagus, small hiatal hernia, multiple fundal gland polyps not manipulated, gastritis, negative H.pylori  . ESOPHAGOGASTRODUODENOSCOPY  06/21/02   NID:POEUM sliding hiatal hernia with mild changes of reflux esophagitis limited to gastroesophageal junction.  Noncritical ring at distal esophagus, 3  cm proximal to gastroesophageal junction/Antral gastritis  . [place]   "broke face playing softball"  . FRACTURE SURGERY     "left wrist; broke it; took spur off"  . HOLMIUM LASER APPLICATION Right 12/25/1094   Procedure: HOLMIUM LASER APPLICATION;  Surgeon: [name], MD;  Location: [facility];  Service: Urology;  Laterality: Right;  . KNEE ARTHROSCOPY Right 05/18/2018   Procedure: PARTIAL MEDIAL MENISECTOMY AND SURGICAL LAVAGE AND CHONDROPLASTY;  Surgeon: [name], MD;  Location: [facility];  Service: Orthopedics;  Laterality: Right;  . LEFT HEART CATHETERIZATION WITH CORONARY ANGIOGRAM N/A 12/27/2013   Procedure: LEFT HEART CATHETERIZATION WITH CORONARY ANGIOGRAM;  Surgeon: [name], MD;  Location: [facility];  Service: Cardiovascular;  Laterality: N/A;  . neck epidural    . POSTERIOR LUMBAR FUSION 4 LEVEL N/A 03/28/2018   Procedure: Thoracic eight -Lumbar two- FIXATION WITH SCREW PLACEMENT, DECOMPRESSION Thoracic ten-Thoracic eleven  FOR OSTEOMYELITIS;  Surgeon: Kristeen Miss, MD;  Location: [facility];  Service: Neurosurgery;  Laterality: N/A;  . SHOULDER SURGERY Bilateral   . TEE WITHOUT CARDIOVERSION N/A 03/21/2018   Procedure: TRANSESOPHAGEAL ECHOCARDIOGRAM (TEE) WITH PROPOFOL;  Surgeon: [name], MD;  [facility];  Service: Cardiovascular;  Laterality: N/A;    There were no vitals filed for this visit.  Subjective Assessment - 06/07/19 1326    Subjective  Pt states that he feel much better compared to last week. He still feels like crap, but much better.    Pertinent History  T10-11 Epidural Abscess, back surgery on 03/28/18, right knee partial medial menisectomy and chondroplasty    Limitations  Standing;Walking;House hold activities;Lifting    Diagnostic tests  CT Lumbar Spine 05/12/18: "Spinal stimulator device, along with multilevel multisegment fusion hardware appear appropriately placed without adverse features."    Patient Stated Goals  To walk    Currently in Pain?  No/denies              [facility] Adult PT Treatment/Exercise - 06/07/19 0001      Ambulation/Gait   Ambulation/Gait  Yes    Ambulation/Gait Assistance  4: Min guard    Ambulation/Gait Assistance Details  w/c follow for safety    Ambulation Distance (Feet)  180 Feet   x80, x100   Assistive device  Rolling walker    Gait Pattern  Decreased dorsiflexion - left;Decreased dorsiflexion - right;Trunk flexed;Decreased step length - right;Decreased step  length - left;Step-through pattern      Knee/Hip Exercises: Aerobic   Nustep  10 minutes on hill profile 3 (level 6) 30 sec fast/30 sec easy      Knee/Hip Exercises: Machines for Strengthening   Hip Cybex  seated rows (with straight bar) 50# 2x20reps    Other Machine  lat pulldowns 40#, 2x20reps      Knee/Hip Exercises: Standing   Other Standing Knee Exercises  Lateral stepping infront of mat table, no UE support with RW positioned anterior if needed, x2RT             PT Education - 06/07/19 1328    Education Details  exercise technique, continue HEP, purpose of reassessment next visit    Person(s) Educated  Patient    Methods  Explanation    Comprehension  Verbalized understanding       PT Short Term Goals - 05/02/19 1245      PT SHORT TERM GOAL #1   Title  Patient will participate in HEP daily and will be  able to instruct therapist in exercises for stretching and strengthening to improve mobility.    Time  3    Period  Weeks    Status  New    Target Date  05/23/19      PT SHORT TERM GOAL #2   Title  Patient will report or demonstrate ability to perform ambulation for 5 minutes with rollator with no more than supervision assistance without rest break and reported no more than minimal difficulty.     Time  3    Period  Weeks    Status  New        PT Long Term Goals - 05/02/19 1250      PT LONG TERM GOAL #1   Title  Patient will report or demonstrate ability to perform ambulation for 8 minutes with LRAD with no more than supervision assistance without rest break and reported no more than minimal difficulty.     Time  6    Period  Weeks    Status  New    Target Date  06/13/19      PT LONG TERM GOAL #2   Title  Patient will demonstrate ability to stand at a support surface for 10 minutes without a rest break in order to perform household chores and ADL's with increased ease.     Time  6    Period  Weeks    Status  New      PT LONG TERM GOAL #3   Title   Patient will demonstrate ability to ambulate 120 feet without rest break using LRAD with no more than minimal assistance.    Time  6    Period  Weeks    Status  New      PT LONG TERM GOAL #4   Title  Patient will be participating in exercise program at home or [employer], 40-60 minutes of moderate aerobic exercise in addition to therapy, to improve health/wellness in order to lose weight for improved mobility and function.    Time  6    Period  Weeks    Status  New      PT LONG TERM GOAL #5   Title  Patient will demonstrate safe transfer to/from arm bike at home to be able to safely exercise for weight loss program to improve health/wellness and increase physical activity at home.    Time  6    Period  Weeks    Status  New            Plan - 06/07/19 1330    Clinical Impression Statement  Pt feeling much better this session and wasn't limited as much as last session. Continued with gait training, back strengthening, and functional/strength balance. Pt able to perform sidestepping in front of mat this date with out UE; pt challenged with this and was generally unsteady with only 1 min-mod LOB which required CGA to assist. Ended on Nustep for HIIT training/aerobic training and reciprocal gait training. Pt due fore reassessment next visit.    Rehab Potential  Fair    Clinical Impairments Affecting Rehab Potential  Positive: Highly motivated; Negative: Comorbidities    PT Frequency  2x / week   start at 1x/week and may increase to 2 during last 3 weeks   PT Duration  6 weeks    PT Treatment/Interventions  ADLs/Self Care Home Management;Aquatic Therapy;DME Instruction;Gait training;Functional mobility training;Stair training;Therapeutic activities;Therapeutic exercise;Balance training;Neuromuscular re-education;Patient/family education;Orthotic Fit/Training;Wheelchair mobility training;Manual techniques;Passive range of motion;Energy conservation;Dry needling;Splinting;Taping  PT Next Visit  Plan  Reassessment; continue lat pulldowns and rowing for back strengthening; consider working on floor to stand transfers with soft mat as pt reports he gets himself up from the floor. continue educate on importance of regular moderate aerobic exercise to improve health and promote weight loss. Review importance of strengthening and stretching at home and of stretching for improve posture and gait quality. Use machine-based strengthening to improve patient's independence with using equipment at gym. Continue tricep press ups    PT Home Exercise Plan  08/16/18:  seated DF/PF, LAQ, marching, abd with theraband and bridge; 11/13: HS and calf stretching (says he has been performing this at home); 01/24/2019 = prone hip flexor stretch; 3/12: eccentric STS    Consulted and Agree with Plan of Care  Patient       Patient will benefit from skilled therapeutic intervention in order to improve the following deficits and impairments:  Abnormal gait, Decreased balance, Decreased endurance, Decreased mobility, Difficulty walking, Hypomobility, Decreased range of motion, Decreased activity tolerance, Decreased strength, Impaired flexibility  Visit Diagnosis: 1. Other abnormalities of gait and mobility   2. Other symptoms and signs involving the musculoskeletal system   3. Muscle weakness (generalized)        Problem List Patient Active Problem List   Diagnosis Date Noted  . Pseudogout of knee   . Pyogenic arthritis of right knee joint ([place])   . Falls frequently 05/08/2018  . Fall   . Sleep disturbance   . Post-op pain   . Hypokalemia   . Morbid obesity ([place])   . Postoperative pain   . DVT, lower extremity, distal, acute, bilateral ([place])   . Acute blood loss anemia   . Anemia of chronic disease   . Essential hypertension   . Bacteremia   . Myelopathy ([place]) 03/30/2018  . Paraplegia ([place])   . Postlaminectomy syndrome, lumbar region   . Epidural abscess   . Abdominal distension   . Encephalopathy    . Weakness of both lower extremities   . Discitis of thoracic region   . Spinal cord stimulator status   . Staphylococcus aureus sepsis ([place]) 03/20/2018  . Bacteremia due to methicillin susceptible Staphylococcus aureus (MSSA) 03/18/2018  . Obesity 03/18/2018  . Nephrolithiasis 03/16/2018  . AKI (acute kidney injury) ([place]) 03/16/2018  . Cognitive impairment 03/16/2018  . Chronic pain syndrome 03/16/2018  . B12 deficiency 01/28/2016  . Memory difficulty 07/23/2015  . History of cerebrovascular disease 07/23/2015  . FH: colon cancer 08/13/2014  . Hx of adenomatous colonic polyps 08/13/2014  . Chronic back pain 05/30/2012  . CAD, NATIVE VESSEL 07/17/2010  . HYPERCHOLESTEROLEMIA 10/31/2009  . SMOKELESS TOBACCO ABUSE 10/31/2009  . Obstructive sleep apnea 10/31/2009  . Essential hypertension, benign 10/31/2009  . GERD 10/31/2009       Geraldine Solar PT, DPT   [facility] [address] [place], [place], [address] Phone: [phone]   Fax:  [phone]  Name: Cody Velez MRN: [id number] Date of Birth: [birth year]

## 2019-06-08 ENCOUNTER — Encounter (HOSPITAL_COMMUNITY): Payer: Self-pay

## 2019-06-08 ENCOUNTER — Other Ambulatory Visit: Payer: Self-pay

## 2019-06-08 ENCOUNTER — Ambulatory Visit (HOSPITAL_COMMUNITY): Payer: Medicare Other

## 2019-06-08 DIAGNOSIS — R2689 Other abnormalities of gait and mobility: Secondary | ICD-10-CM | POA: Diagnosis not present

## 2019-06-08 DIAGNOSIS — R29898 Other symptoms and signs involving the musculoskeletal system: Secondary | ICD-10-CM

## 2019-06-08 DIAGNOSIS — M6281 Muscle weakness (generalized): Secondary | ICD-10-CM | POA: Diagnosis not present

## 2019-06-08 NOTE — Addendum Note (Signed)
Addended by: Geralyn Corwin on: 06/08/2019 11:56 AM   Modules accepted: Orders

## 2019-06-08 NOTE — Therapy (Addendum)
[place], [place], [address] Phone: [phone]   Fax:  [phone]   Progress Note Reporting Period 05/02/19 to 06/08/19  See note below for Objective Data and Assessment of Progress/Goals.    Physical Therapy Treatment  Patient Details  Name: Cody Velez MRN: [id number] Date of Birth: 09-Jul-1946 Referring Provider (PT): Asencion Noble, MD   Encounter Date: 06/08/2019  PT End of Session - 06/08/19 1104    Visit Number  9    Number of Visits  13    Date for PT Re-Evaluation  06/22/19    Authorization Type  Primary: Medicare; Secondary: [employer] (21 visits per calendar year)    Authorization Time Period  08/15/18 - 10/14/18; 10/13/18 to 11/24/18; 11/22/18 - 01/03/19; 01/03/19 to 02/16/19; 01/24/19-03/16/19; NEW: 05/02/19-06/13/19; NEW: 06/08/19 to 06/22/19    Authorization - Visit Number  54   starting 2020   Authorization - Number of Visits  67    PT Start Time  9509    PT Stop Time  1056    PT Time Calculation (min)  41 min    Equipment Utilized During Treatment  Gait belt    Activity Tolerance  Patient tolerated treatment well    Behavior During Therapy  WFL for tasks assessed/performed       Past Medical History:  Diagnosis Date  . Allergic rhinitis   . Arthritis   . B12 deficiency   . Cervicogenic headache 01/28/2016  . Childhood asthma   . Chronic back pain   . Coronary atherosclerosis of native coronary artery    Mild nonobstructive CAD at catheterization January 2015  . Depression   . Essential hypertension   . Falls   . GERD (gastroesophageal reflux disease)   . History of cerebrovascular disease 07/23/2015  . History of pneumonia 02/2011  . Hyperlipidemia   . Kidney stone   . Memory difficulty 07/23/2015  . OSA (obstructive sleep apnea)    CPAP - Dr. [name]  . Prostate cancer ([place])   . PTSD (post-traumatic stress disorder)    [place]  . Rectal bleeding     Past Surgical History:  Procedure Laterality  Date  . APPLICATION OF ROBOTIC ASSISTANCE FOR SPINAL PROCEDURE N/A 03/28/2018   Procedure: APPLICATION OF ROBOTIC ASSISTANCE FOR SPINAL PROCEDURE;  Surgeon: Kristeen Miss, MD;  Location: [facility];  Service: Neurosurgery;  Laterality: N/A;  . BACK SURGERY  02/14/12   lumbar OR #7; "today redid L1L2; replaced screws; added bone from hip"  . BILATERAL KNEE ARTHROSCOPY    . COLONOSCOPY  10/15/2008   Dr. [name]: tubular adenoma   . COLONOSCOPY  12/17/2003   TOI:ZTIWPY rectal and colon  . COLONOSCOPY N/A 09/05/2014   Procedure: COLONOSCOPY;  Surgeon: Daneil Dolin, MD;  [facility];  Service: Endoscopy;  Laterality: N/A;  7:30-rescheduled 9/17 to [place] notified pt  . CYSTOSCOPY WITH STENT PLACEMENT Right 01/27/2016   Procedure: CYSTOSCOPY WITH STENT PLACEMENT;  Surgeon: [name], MD;  Location: [facility];  Service: Urology;  Laterality: Right;  . CYSTOSCOPY/RETROGRADE/URETEROSCOPY/STONE EXTRACTION WITH BASKET Right 01/27/2016   Procedure: CYSTOSCOPY, RIGHT RETROGRADE, RIGHT URETEROSCOPY, STONE EXTRACTION ;  Surgeon: [name], MD;  Location: [facility];  Service: Urology;  Laterality: Right;  . ESOPHAGOGASTRODUODENOSCOPY  10/15/2008     Dr Rourk:Schatzki's ring status post dilation and disruption via 50 F Maloney dilator/ otherwise unremarkable esophagus, small hiatal hernia, multiple fundal gland polyps not manipulated, gastritis, negative H.pylori  .  ESOPHAGOGASTRODUODENOSCOPY  06/21/02   ATF:TDDUK sliding hiatal hernia with mild changes of reflux esophagitis limited to gastroesophageal junction.  Noncritical ring at distal esophagus, 3 cm proximal to gastroesophageal junction/Antral gastritis  . [place]   "broke face playing softball"  . FRACTURE SURGERY     "left wrist; broke it; took spur off"  . HOLMIUM LASER APPLICATION Right 0/01/5426   Procedure: HOLMIUM LASER APPLICATION;  Surgeon: [name], MD;  Location: [facility];  Service: Urology;   Laterality: Right;  . KNEE ARTHROSCOPY Right 05/18/2018   Procedure: PARTIAL MEDIAL MENISECTOMY AND SURGICAL LAVAGE AND CHONDROPLASTY;  Surgeon: [name], MD;  Location: [facility];  Service: Orthopedics;  Laterality: Right;  . LEFT HEART CATHETERIZATION WITH CORONARY ANGIOGRAM N/A 12/27/2013   Procedure: LEFT HEART CATHETERIZATION WITH CORONARY ANGIOGRAM;  Surgeon: [name], MD;  Location: [facility] CATH LAB;  Service: Cardiovascular;  Laterality: N/A;  . neck epidural    . POSTERIOR LUMBAR FUSION 4 LEVEL N/A 03/28/2018   Procedure: Thoracic eight -Lumbar two- FIXATION WITH SCREW PLACEMENT, DECOMPRESSION Thoracic ten-Thoracic eleven  FOR OSTEOMYELITIS;  Surgeon: Kristeen Miss, MD;  Location: [facility];  Service: Neurosurgery;  Laterality: N/A;  . SHOULDER SURGERY Bilateral   . TEE WITHOUT CARDIOVERSION N/A 03/21/2018   Procedure: TRANSESOPHAGEAL ECHOCARDIOGRAM (TEE) WITH PROPOFOL;  Surgeon: [name], MD;  [facility];  Service: Cardiovascular;  Laterality: N/A;    There were no vitals filed for this visit.  Subjective Assessment - 06/08/19 1018    Subjective  Pt reports no pain and no soreness today after session yesterday.    Pertinent History  T10-11 Epidural Abscess, back surgery on 03/28/18, right knee partial medial menisectomy and chondroplasty    Limitations  Standing;Walking;House hold activities;Lifting    How long can you sit comfortably?  Not limited    How long can you stand comfortably?  5-10 minutes holding onto something (90 seconds without)    How long can you walk comfortably?  With walker 0 minutes    Diagnostic tests  CT Lumbar Spine 05/12/18: "Spinal stimulator device, along with multilevel multisegment fusion hardware appear appropriately placed without adverse features."    Patient Stated Goals  To walk    Currently in Pain?  No/denies         [facility] PT Assessment - 06/08/19 0001      Assessment   Medical Diagnosis  T10-11 Epidural Abscess "Paraplegia"     Referring Provider (PT)  Asencion Noble, MD    Onset Date/Surgical Date  03/28/18    Next MD Visit  He has appts with Willey Blade, Dr. Ellene Route, and Dr. Bridget Hartshorn coming up in the next month. [facility] this past [facility] and reports that was good.     Prior Therapy  Yes, inpatient rehab and home health which patient reported ended 08/04/18      Precautions   Precautions  Fall      Restrictions   Weight Bearing Restrictions  No      Functional Tests   Functional tests  Other      Other:   Other/ Comments  Standing balance with UE support: 10 min      Ambulation/Gait   Ambulation/Gait  Yes    Ambulation/Gait Assistance  5: Supervision;4: Min guard    Ambulation/Gait Assistance Details  w/c for safety    Ambulation Distance (Feet)  146 Feet   3:42 straight, 3:39 straight  Assistive device  Rolling walker    Gait Pattern  Decreased dorsiflexion - left;Decreased dorsiflexion - right;Trunk flexed;Decreased step length - right;Decreased step length - left;Step-through pattern         PT Education - 06/08/19 1109    Education Details  reassessment findings, POC, importance of performing HEP/regular exercise routine at home    Person(s) Educated  Patient    Methods  Explanation    Comprehension  Verbalized understanding          PT Short Term Goals - 06/08/19 1019      PT SHORT TERM GOAL #1   Title  Patient will participate in HEP daily and will be able to instruct therapist in exercises for stretching and strengthening to improve mobility.    Baseline  6/19: pt reports doing some at home    Time  3    Period  Weeks    Status  Partially Met    Target Date  05/23/19      PT SHORT TERM GOAL #2   Title  Patient will report or demonstrate ability to perform ambulation for 5 minutes with rollator with no more than supervision assistance without rest break and reported no more than minimal difficulty.     Baseline  6/19: 3:42, 3:39 with RW    Time  3     Period  Weeks    Status  On-going        PT Long Term Goals - 06/08/19 1020      PT LONG TERM GOAL #1   Title  Patient will report or demonstrate ability to perform ambulation for 8 minutes with LRAD with no more than supervision assistance without rest break and reported no more than minimal difficulty.     Baseline  6/19: 3:42 straight, 3:39 straight with RW, supv-min guard    Time  6    Period  Weeks    Status  On-going      PT LONG TERM GOAL #2   Title  Patient will demonstrate ability to stand at a support surface for 10 minutes without a rest break in order to perform household chores and ADL's with increased ease.     Baseline  01/24/19 = reports he can do this with support surface; 6/19: demo'd this today in clinic    Time  6    Period  Weeks    Status  Achieved      PT LONG TERM GOAL #3   Title  Patient will demonstrate ability to ambulate 120 feet without rest break using LRAD with no more than minimal assistance.    Baseline  6/19: 160f total with 1 seated break in between    Time  6    Period  Weeks    Status  On-going      PT LONG TERM GOAL #4   Title  Patient will be participating in exercise program at home or [facility], 40-60 minutes of moderate aerobic exercise in addition to therapy, to improve health/wellness in order to lose weight for improved mobility and function.    Baseline  6/19: has not gone yet    Time  6    Period  Weeks    Status  On-going      PT LONG TERM GOAL #5   Title  Patient will demonstrate safe transfer to/from arm bike at home to be able to safely exercise for weight loss program to improve health/wellness and increase physical activity at home.  Baseline  6/19: pt's arm bike is portable system that he can use for UE or LE, does not have to transfer to use    Time  6    Period  Weeks    Status  Deferred            Plan - 06/08/19 1105    Clinical Impression Statement  PT reassessed pt's goals and outcome measures this date. Pt  has made progress towards goals AEB ability to stand for 10 mins straight at a support surface without the need for a seated rest break. Following a good therapeutic rest break, pt able to ambulate with RW for 3:42 straight and then 3:39 straight with supervision to min guard assist. Feel he could have ambulated for a longer distance had he not performed the standing for 10 straight minutes prior. He has yet to begin a routine exercise program at the [facility]; this was initially limited due to COVID-19, however, he is able to make appointments now and just hasn't done it yet. Overall, pt has done well but is beginning to plateau. PT recommending brief extension of therapy services for 2x/week for 2 weeks to begin to transition pt to HEP for him to manage strength and aerobic training independently. Pt agreeable to this plan.    Rehab Potential  Fair    Clinical Impairments Affecting Rehab Potential  Positive: Highly motivated; Negative: Comorbidities    PT Frequency  2x / week    PT Duration  2 weeks    PT Treatment/Interventions  ADLs/Self Care Home Management;Aquatic Therapy;DME Instruction;Gait training;Functional mobility training;Stair training;Therapeutic activities;Therapeutic exercise;Balance training;Neuromuscular re-education;Patient/family education;Orthotic Fit/Training;Wheelchair mobility training;Manual techniques;Passive range of motion;Energy conservation;Dry needling;Splinting;Taping    PT Next Visit Plan  begin to transition pt to HEP for strength and aerobic training; continue lat pulldowns and rowing for back strengthening; consider working on floor to stand transfers with soft mat as pt reports he gets himself up from the floor. continue educate on importance of regular moderate aerobic exercise to improve health and promote weight loss. Review importance of strengthening and stretching at home and of stretching for improve posture and gait quality. Use machine-based strengthening to  improve patient's independence with using equipment at gym. Continue tricep press ups    PT Home Exercise Plan  08/16/18:  seated DF/PF, LAQ, marching, abd with theraband and bridge; 11/13: HS and calf stretching (says he has been performing this at home); 01/24/2019 = prone hip flexor stretch; 3/12: eccentric STS    Consulted and Agree with Plan of Care  Patient       Patient will benefit from skilled therapeutic intervention in order to improve the following deficits and impairments:  Abnormal gait, Decreased balance, Decreased endurance, Decreased mobility, Difficulty walking, Hypomobility, Decreased range of motion, Decreased activity tolerance, Decreased strength, Impaired flexibility  Visit Diagnosis: 1. Other abnormalities of gait and mobility   2. Other symptoms and signs involving the musculoskeletal system   3. Muscle weakness (generalized)        Problem List Patient Active Problem List   Diagnosis Date Noted  . Pseudogout of knee   . Pyogenic arthritis of right knee joint ([place])   . Falls frequently 05/08/2018  . Fall   . Sleep disturbance   . Post-op pain   . Hypokalemia   . Morbid obesity ([place])   . Postoperative pain   . DVT, lower extremity, distal, acute, bilateral ([place])   . Acute blood loss anemia   . Anemia  of chronic disease   . Essential hypertension   . Bacteremia   . Myelopathy ([place]) 03/30/2018  . Paraplegia ([place])   . Postlaminectomy syndrome, lumbar region   . Epidural abscess   . Abdominal distension   . Encephalopathy   . Weakness of both lower extremities   . Discitis of thoracic region   . Spinal cord stimulator status   . Staphylococcus aureus sepsis ([place]) 03/20/2018  . Bacteremia due to methicillin susceptible Staphylococcus aureus (MSSA) 03/18/2018  . Obesity 03/18/2018  . Nephrolithiasis 03/16/2018  . AKI (acute kidney injury) ([place]) 03/16/2018  . Cognitive impairment 03/16/2018  . Chronic pain syndrome 03/16/2018  . B12 deficiency  01/28/2016  . Memory difficulty 07/23/2015  . History of cerebrovascular disease 07/23/2015  . FH: colon cancer 08/13/2014  . Hx of adenomatous colonic polyps 08/13/2014  . Chronic back pain 05/30/2012  . CAD, NATIVE VESSEL 07/17/2010  . HYPERCHOLESTEROLEMIA 10/31/2009  . SMOKELESS TOBACCO ABUSE 10/31/2009  . Obstructive sleep apnea 10/31/2009  . Essential hypertension, benign 10/31/2009  . GERD 10/31/2009       Geraldine Solar PT, DPT   [facility] [address] [place], [place], [address] Phone: [phone]   Fax:  [phone]  Name: Cody Velez MRN: [id number] Date of Birth: 1946-07-06

## 2019-06-11 ENCOUNTER — Telehealth (HOSPITAL_COMMUNITY): Payer: Self-pay

## 2019-06-11 NOTE — Telephone Encounter (Signed)
Pt l/m wanted to change his appointments to once a week due to transporation. I called him back and left a message-requested a return phone call to help with his scheduling issues.

## 2019-06-13 ENCOUNTER — Ambulatory Visit (HOSPITAL_COMMUNITY): Payer: Medicare Other

## 2019-06-20 ENCOUNTER — Other Ambulatory Visit: Payer: Self-pay

## 2019-06-20 ENCOUNTER — Ambulatory Visit (HOSPITAL_COMMUNITY): Payer: Medicare Other | Attending: Internal Medicine

## 2019-06-20 ENCOUNTER — Encounter (HOSPITAL_COMMUNITY): Payer: Self-pay

## 2019-06-20 DIAGNOSIS — R29898 Other symptoms and signs involving the musculoskeletal system: Secondary | ICD-10-CM

## 2019-06-20 DIAGNOSIS — M6281 Muscle weakness (generalized): Secondary | ICD-10-CM | POA: Insufficient documentation

## 2019-06-20 DIAGNOSIS — R2689 Other abnormalities of gait and mobility: Secondary | ICD-10-CM | POA: Diagnosis not present

## 2019-06-20 NOTE — Therapy (Signed)
[place], [place], [address] Phone: [phone]   Fax:  [phone]  Physical Therapy Treatment  Patient Details  Name: Cody Velez MRN: [id number] Date of Birth: June 05, 1946 Referring Provider (PT): Asencion Noble, MD   Encounter Date: 06/20/2019  PT End of Session - 06/20/19 1607    Visit Number  10    Number of Visits  13    Date for PT Re-Evaluation  06/22/19    Authorization Type  Primary: Medicare; Secondary: BCBS (75 visits per calendar year)    Authorization Time Period  08/15/18 - 10/14/18; 10/13/18 to 11/24/18; 11/22/18 - 01/03/19; 01/03/19 to 02/16/19; 01/24/19-03/16/19; NEW: 05/02/19-06/13/19; NEW: 06/08/19 to 06/22/19    Authorization - Visit Number  74   starting 2020   Authorization - Number of Visits  77    PT Start Time  1314    PT Stop Time  1400    PT Time Calculation (min)  46 min    Equipment Utilized During Treatment  Gait belt    Activity Tolerance  Patient tolerated treatment well    Behavior During Therapy  [facility] for tasks assessed/performed       Past Medical History:  Diagnosis Date  . Allergic rhinitis   . Arthritis   . B12 deficiency   . Cervicogenic headache 01/28/2016  . Childhood asthma   . Chronic back pain   . Coronary atherosclerosis of native coronary artery    Mild nonobstructive CAD at catheterization January 2015  . Depression   . Essential hypertension   . Falls   . GERD (gastroesophageal reflux disease)   . History of cerebrovascular disease 07/23/2015  . History of pneumonia 02/2011  . Hyperlipidemia   . Kidney stone   . Memory difficulty 07/23/2015  . OSA (obstructive sleep apnea)    CPAP - Dr. [name]  . Prostate cancer ([place])   . PTSD (post-traumatic stress disorder)    [place]  . Rectal bleeding     Past Surgical History:  Procedure Laterality Date  . APPLICATION OF ROBOTIC ASSISTANCE FOR SPINAL PROCEDURE N/A 03/28/2018   Procedure: APPLICATION OF ROBOTIC ASSISTANCE FOR  SPINAL PROCEDURE;  Surgeon: Kristeen Miss, MD;  Location: [place];  Service: Neurosurgery;  Laterality: N/A;  . BACK SURGERY  02/14/12   lumbar OR #7; "today redid L1L2; replaced screws; added bone from hip"  . BILATERAL KNEE ARTHROSCOPY    . COLONOSCOPY  10/15/2008   Dr. [name]: tubular adenoma   . COLONOSCOPY  12/17/2003   PQA:ESLPNP rectal and colon  . COLONOSCOPY N/A 09/05/2014   Procedure: COLONOSCOPY;  Surgeon: Daneil Dolin, MD;  [facility];  Service: Endoscopy;  Laterality: N/A;  7:30-rescheduled 9/17 to [place] notified pt  . CYSTOSCOPY WITH STENT PLACEMENT Right 01/27/2016   Procedure: CYSTOSCOPY WITH STENT PLACEMENT;  Surgeon: [name], MD;  [facility]: [facility];  Service: Urology;  Laterality: Right;  . CYSTOSCOPY/RETROGRADE/URETEROSCOPY/STONE EXTRACTION WITH BASKET Right 01/27/2016   Procedure: CYSTOSCOPY, RIGHT RETROGRADE, RIGHT URETEROSCOPY, STONE EXTRACTION ;  Surgeon: [name], MD;  Location: [facility];  Service: Urology;  Laterality: Right;  . ESOPHAGOGASTRODUODENOSCOPY  10/15/2008     Dr Rourk:Schatzki's ring status post dilation and disruption via 24 F Maloney dilator/ otherwise unremarkable esophagus, small hiatal hernia, multiple fundal gland polyps not manipulated, gastritis, negative H.pylori  . ESOPHAGOGASTRODUODENOSCOPY  06/21/02   YYF:RTMYT sliding hiatal hernia with mild changes of reflux esophagitis limited to gastroesophageal junction.  Noncritical ring  at distal esophagus, 3 cm proximal to gastroesophageal junction/Antral gastritis  . [place]   "broke face playing softball"  . FRACTURE SURGERY     "left wrist; broke it; took spur off"  . HOLMIUM LASER APPLICATION Right 01/26/7411   Procedure: HOLMIUM LASER APPLICATION;  Surgeon: [name], MD;  Location: [facility];  Service: Urology;  Laterality: Right;  . KNEE ARTHROSCOPY Right 05/18/2018   Procedure: PARTIAL MEDIAL MENISECTOMY AND SURGICAL LAVAGE AND  CHONDROPLASTY;  Surgeon: [name], MD;  Location: [place];  Service: Orthopedics;  Laterality: Right;  . LEFT HEART CATHETERIZATION WITH CORONARY ANGIOGRAM N/A 12/27/2013   Procedure: LEFT HEART CATHETERIZATION WITH CORONARY ANGIOGRAM;  Surgeon: [name], MD;  Location: [facility] CATH LAB;  Service: Cardiovascular;  Laterality: N/A;  . neck epidural    . POSTERIOR LUMBAR FUSION 4 LEVEL N/A 03/28/2018   Procedure: Thoracic eight -Lumbar two- FIXATION WITH SCREW PLACEMENT, DECOMPRESSION Thoracic ten-Thoracic eleven  FOR OSTEOMYELITIS;  Surgeon: Kristeen Miss, MD;  Location: [place];  Service: Neurosurgery;  Laterality: N/A;  . SHOULDER SURGERY Bilateral   . TEE WITHOUT CARDIOVERSION N/A 03/21/2018   Procedure: TRANSESOPHAGEAL ECHOCARDIOGRAM (TEE) WITH PROPOFOL;  Surgeon: [name], MD;  [facility];  Service: Cardiovascular;  Laterality: N/A;    There were no vitals filed for this visit.  Subjective Assessment - 06/20/19 1314    Subjective  Patient reports he still has not scheduled any times at the [facility] to go exercise in the pool to exercise. He struggles to motivate himself and has been feeling very depressed about his weight. He is working with the nutritionist from the [place] still and has cut back his soft drink intake from about 2 a day to 1. He reports he has some gym equipment at home: boflex, foot peddles for the floor, some free weights, and he thinks he has a bike downstairs. He states [name] is leaving Monday next week after his appointment for 1 month to go to [place].    Pertinent History  T10-11 Epidural Abscess, back surgery on 03/28/18, right knee partial medial menisectomy and chondroplasty    Limitations  Standing;Walking;House hold activities;Lifting    How long can you sit comfortably?  Not limited    How long can you stand comfortably?  5-10 minutes holding onto something (90 seconds without)    How long can you walk comfortably?  With walker 0 minutes    Diagnostic  tests  CT Lumbar Spine 05/12/18: "Spinal stimulator device, along with multilevel multisegment fusion hardware appear appropriately placed without adverse features."    Patient Stated Goals  To walk    Currently in Pain?  No/denies       [facility] Adult PT Treatment/Exercise - 06/20/19 0001      Ambulation/Gait   Ambulation/Gait  Yes    Ambulation/Gait Assistance  5: Supervision    Ambulation/Gait Assistance Details  wheelchair follow    Ambulation Distance (Feet)  --   2 bouts   Assistive device  Rolling walker    Gait Pattern  Decreased dorsiflexion - left;Decreased dorsiflexion - right;Trunk flexed;Decreased step length - right;Decreased step length - left;Step-through pattern      Knee/Hip Exercises: Aerobic   Nustep  10 minutes on hill profile 4 (level 5), RPM above 70 always      Knee/Hip Exercises: Machines for Strengthening   Total Gym Leg Press  [employer]craft: 40#, 2x15 reps (min assist for set up)    Hip Cybex  seated rows (with straight bar) 60# 2x20reps    Other Machine  lat pulldowns 60#, 2x20reps       PT Education - 06/20/19 1411    Education Details  Educated on plan to have 1 more session next week and that this will be the final session per the discussion he had with therapist who did his last re-assessment. Educated on importance of patient being independent in scheduling times to go exercise at [facility] and provided extensive handout of how to sign up for the swim times.    Person(s) Educated  Patient    Methods  Explanation;Handout    Comprehension  Verbalized understanding       PT Short Term Goals - 06/08/19 1019      PT SHORT TERM GOAL #1   Title  Patient will participate in HEP daily and will be able to instruct therapist in exercises for stretching and strengthening to improve mobility.    Baseline  6/19: pt reports doing some at home    Time  3    Period  Weeks    Status  Partially Met    Target Date  05/23/19      PT SHORT TERM GOAL #2   Title  Patient will  report or demonstrate ability to perform ambulation for 5 minutes with rollator with no more than supervision assistance without rest break and reported no more than minimal difficulty.     Baseline  6/19: 3:42, 3:39 with RW    Time  3    Period  Weeks    Status  On-going        PT Long Term Goals - 06/08/19 1020      PT LONG TERM GOAL #1   Title  Patient will report or demonstrate ability to perform ambulation for 8 minutes with LRAD with no more than supervision assistance without rest break and reported no more than minimal difficulty.     Baseline  6/19: 3:42 straight, 3:39 straight with RW, supv-min guard    Time  6    Period  Weeks    Status  On-going      PT LONG TERM GOAL #2   Title  Patient will demonstrate ability to stand at a support surface for 10 minutes without a rest break in order to perform household chores and ADL's with increased ease.     Baseline  01/24/19 = reports he can do this with support surface; 6/19: demo'd this today in clinic    Time  6    Period  Weeks    Status  Achieved      PT LONG TERM GOAL #3   Title  Patient will demonstrate ability to ambulate 120 feet without rest break using LRAD with no more than minimal assistance.    Baseline  6/19: 128f total with 1 seated break in between    Time  6    Period  Weeks    Status  On-going      PT LONG TERM GOAL #4   Title  Patient will be participating in exercise program at home or [facility], 40-60 minutes of moderate aerobic exercise in addition to therapy, to improve health/wellness in order to lose weight for improved mobility and function.    Baseline  6/19: has not gone yet    Time  6    Period  Weeks    Status  On-going      PT LONG TERM GOAL #5   Title  Patient will demonstrate safe transfer to/from arm bike at home to be able to safely exercise for weight loss program to improve health/wellness and increase physical activity at home.    Baseline  6/19: pt's arm bike is portable system that he can  use for UE or LE, does not have to transfer to use    Time  6    Period  Weeks    Status  Deferred         Plan - 06/20/19 1415    Clinical Impression Statement  Patient remains non-compliant with progressing his home exercise routine to include aerobic activities and pool exercises to continue working on walking in a safe environment. Throughout session educated patient on importance of him taking some initiative to begin pool exercises to assist with weight loss and that this may help with his depression as well. Patient ambulated with supervision and wheelchair follow for safety using RW and required 1 seated rest break for 150'. He continued with NuStep on hill program for aerobic exercise and machines for LE and UE strengthening. Patient was educated on process of booking time at the pool at the [facility] as he is a member there. Provided a detailed handout on how to do this and requested patient bring in a schedule of times he signed up for to go exercise the next time he comes to therapy. He was educated again on his plateau with functional mobility and the need for him to begin exercising on his own. He stated his understanding. Patient will participate in 1 more appointment next week and anticipate discharge at that time.    Rehab Potential  Fair    Clinical Impairments Affecting Rehab Potential  Positive: Highly motivated; Negative: Comorbidities    PT Frequency  2x / week    PT Duration  2 weeks    PT Treatment/Interventions  ADLs/Self Care Home Management;Aquatic Therapy;DME Instruction;Gait training;Functional mobility training;Stair training;Therapeutic activities;Therapeutic exercise;Balance training;Neuromuscular re-education;Patient/family education;Orthotic Fit/Training;Wheelchair mobility training;Manual techniques;Passive range of motion;Energy conservation;Dry needling;Splinting;Taping    PT Next Visit Plan  begin to transition pt to HEP for strength and aerobic training;  continue lat pulldowns and rowing for back strengthening; consider working on floor to stand transfers with soft mat as pt reports he gets himself up from the floor. continue educate on importance of regular moderate aerobic exercise to improve health and promote weight loss. Review importance of strengthening and stretching at home and of stretching for improve posture and gait quality. Use machine-based strengthening to improve patient's independence with using equipment at gym. Continue tricep press ups    PT Home Exercise Plan  08/16/18:  seated DF/PF, LAQ, marching, abd with theraband and bridge; 11/13: HS and calf stretching (says he has been performing this at home); 01/24/2019 = prone hip flexor stretch; 3/12: eccentric STS    Consulted and Agree with Plan of Care  Patient       Patient will benefit from skilled therapeutic intervention in order to improve the following deficits and impairments:  Abnormal gait, Decreased balance, Decreased endurance, Decreased mobility, Difficulty walking, Hypomobility, Decreased range of motion, Decreased activity tolerance, Decreased strength, Impaired flexibility  Visit Diagnosis: 1. Other abnormalities of gait and mobility   2. Other symptoms and signs involving the musculoskeletal system   3. Muscle weakness (generalized)        Problem List Patient Active Problem List   Diagnosis Date Noted  . Pseudogout of knee   . Pyogenic arthritis of right knee joint (  [place])   . Falls frequently 05/08/2018  . Fall   . Sleep disturbance   . Post-op pain   . Hypokalemia   . Morbid obesity ([place])   . Postoperative pain   . DVT, lower extremity, distal, acute, bilateral ([place])   . Acute blood loss anemia   . Anemia of chronic disease   . Essential hypertension   . Bacteremia   . Myelopathy ([place]) 03/30/2018  . Paraplegia ([place])   . Postlaminectomy syndrome, lumbar region   . Epidural abscess   . Abdominal distension   . Encephalopathy   . Weakness of both  lower extremities   . Discitis of thoracic region   . Spinal cord stimulator status   . Staphylococcus aureus sepsis ([place]) 03/20/2018  . Bacteremia due to methicillin susceptible Staphylococcus aureus (MSSA) 03/18/2018  . Obesity 03/18/2018  . Nephrolithiasis 03/16/2018  . AKI (acute kidney injury) ([place]) 03/16/2018  . Cognitive impairment 03/16/2018  . Chronic pain syndrome 03/16/2018  . B12 deficiency 01/28/2016  . Memory difficulty 07/23/2015  . History of cerebrovascular disease 07/23/2015  . FH: colon cancer 08/13/2014  . Hx of adenomatous colonic polyps 08/13/2014  . Chronic back pain 05/30/2012  . CAD, NATIVE VESSEL 07/17/2010  . HYPERCHOLESTEROLEMIA 10/31/2009  . SMOKELESS TOBACCO ABUSE 10/31/2009  . Obstructive sleep apnea 10/31/2009  . Essential hypertension, benign 10/31/2009  . GERD 10/31/2009    Kipp Brood, PT, DPT, [facility] Physical Therapist with [facility]  06/20/2019 4:07 PM    [place] [address] [place], [place], [address] Phone: [phone]   Fax:  [phone]  Name: ALISHA BURGO MRN: [id number] Date of Birth: 1946/12/12

## 2019-06-20 NOTE — Patient Instructions (Addendum)
Go to...    [email or web address]  Click the [facility] with information about COVID-19     Scroll down the page to where it says pool:   Click the yellow link for pool schedules:    (THIS IS THE INFORMATION ON THE PAGE ABOVE)  Pool Non-Swimmer Update: We will maintain our 1 adult to 2 non-swimmers without life jackets  We will allow 1 adult to 4 swimmers/non-swimmers as long as all children are in life jackets  We will now be able to provide life jackets (upon parent request) to those non-swimmers that need one. These will be properly sanitized between guests Pool lap lanes and family swim will be accessible, via reservation only on our [employer].  Water fitness will be offered at select branches - contact your branch for details.  Members may only enter the facility for the specified activities, e.g., pool usage. We will implement a self-scan system upon entry (team members will not handle cards, phones, or any other personal belongings).  We will be conducting wellness and temperature checks of all who enter the facility. Members will be screened for COVID-19 exposure and/or symptoms prior to entering the Y. These questions include:  Have you had close contact (within 6 feet for at least 10 minutes) in the last 14 days with someone diagnosed with COVID-19, or has any health department been in contact with you and advised you to quarantine?  In the last 72 hours, have you had a fever or felt feverish, had any muscle aches, chills, or respiratory symptoms, including runny nose, sore throat, cough, or shortness of breath; or developed a new loss of taste or smell?  Is your temperature 100.4 degrees or above? Hours Available for Reservations: M-F, 8 am - 7 pm; Sat, 8 am - 1 pm; Sun, Closed  We have limited locker room availability so please come dressed to swim, if possible.   Then adjust the location top on the page and click on a  slot for swimming related to the [facility].         After selecting the date and time you would like choose sign up and enter your information.

## 2019-06-21 ENCOUNTER — Ambulatory Visit (HOSPITAL_COMMUNITY)
Admission: RE | Admit: 2019-06-21 | Discharge: 2019-06-21 | Disposition: A | Discharge status: Home / Self Care | Payer: Medicare Other | Source: Ambulatory Visit | Attending: Internal Medicine | Admitting: Internal Medicine

## 2019-06-21 ENCOUNTER — Other Ambulatory Visit (HOSPITAL_COMMUNITY): Payer: Self-pay | Admitting: Internal Medicine

## 2019-06-21 DIAGNOSIS — R609 Edema, unspecified: Secondary | ICD-10-CM | POA: Diagnosis not present

## 2019-06-21 DIAGNOSIS — R600 Localized edema: Secondary | ICD-10-CM | POA: Diagnosis not present

## 2019-06-25 ENCOUNTER — Other Ambulatory Visit: Payer: Self-pay

## 2019-06-25 ENCOUNTER — Ambulatory Visit (HOSPITAL_COMMUNITY): Payer: Medicare Other | Admitting: Physical Therapy

## 2019-06-25 DIAGNOSIS — M6281 Muscle weakness (generalized): Secondary | ICD-10-CM | POA: Diagnosis not present

## 2019-06-25 DIAGNOSIS — R29898 Other symptoms and signs involving the musculoskeletal system: Secondary | ICD-10-CM

## 2019-06-25 DIAGNOSIS — R2689 Other abnormalities of gait and mobility: Secondary | ICD-10-CM | POA: Diagnosis not present

## 2019-06-25 NOTE — Therapy (Addendum)
[facility] [address] [place], [place], [address] Phone: [phone]   Fax:  [phone]   PHYSICAL THERAPY DISCHARGE SUMMARY  Visits from Start of Care: 44 (total since initiation of therapy)  Current functional level related to goals / functional outcomes: See below   Remaining deficits: See below   Education / Equipment: Complete HEP/regular exercise routine  Plan: Patient agrees to discharge.  Patient goals were partially met. Patient is being discharged due to lack of progress.  ?????     This PT spoke with PTA prior to performing reassessment and has read and reviewed this note in its entirety and agree with its findings. Pt has maintained his therapeutic gains made in PT sessions but has had a plateau in his progress as evidenced below. Pt has been provided with extensive handouts and verbal education for importance of HEP completion, regular exercise routines, and how to [facility] in order to participate in aquatic exercise independently. At this time, pt is ready for d/c.   Physical Therapy Treatment  Patient Details  Name: Cody Velez MRN: [id number] Date of Birth: 11-18-1946 Referring Provider (PT): Asencion Noble, MD   Encounter Date: 06/25/2019  PT End of Session - 06/25/19 1723    Visit Number  11    Number of Visits  13    Date for PT Re-Evaluation  06/22/19    Authorization Type  Primary: Medicare; Secondary: BCBS (75 visits per calendar year)    Authorization Time Period  08/15/18 - 10/14/18; 10/13/18 to 11/24/18; 11/22/18 - 01/03/19; 01/03/19 to 02/16/19; 01/24/19-03/16/19; NEW: 05/02/19-06/13/19; NEW: 06/08/19 to 06/22/19    Authorization - Visit Number  41   starting 2020   Authorization - Number of Visits  3    PT Start Time  1005    PT Stop Time  1100    PT Time Calculation (min)  55 min    Equipment Utilized During Treatment  Gait belt    Activity Tolerance  Patient tolerated treatment  well    Behavior During Therapy  WFL for tasks assessed/performed       Past Medical History:  Diagnosis Date  . Allergic rhinitis   . Arthritis   . B12 deficiency   . Cervicogenic headache 01/28/2016  . Childhood asthma   . Chronic back pain   . Coronary atherosclerosis of native coronary artery    Mild nonobstructive CAD at catheterization January 2015  . Depression   . Essential hypertension   . Falls   . GERD (gastroesophageal reflux disease)   . History of cerebrovascular disease 07/23/2015  . History of pneumonia 02/2011  . Hyperlipidemia   . Kidney stone   . Memory difficulty 07/23/2015  . OSA (obstructive sleep apnea)    CPAP - Dr. [name]  . Prostate cancer ([place])   . PTSD (post-traumatic stress disorder)    [place]  . Rectal bleeding     Past Surgical History:  Procedure Laterality Date  . APPLICATION OF ROBOTIC ASSISTANCE FOR SPINAL PROCEDURE N/A 03/28/2018   Procedure: APPLICATION OF ROBOTIC ASSISTANCE FOR SPINAL PROCEDURE;  Surgeon: Kristeen Miss, MD;  Location: [facility];  Service: Neurosurgery;  Laterality: N/A;  . BACK SURGERY  02/14/12   lumbar OR #7; "today redid L1L2; replaced screws; added bone from hip"  . BILATERAL KNEE ARTHROSCOPY    . COLONOSCOPY  10/15/2008   Dr. [name]: tubular adenoma   . COLONOSCOPY  12/17/2003   GNO:IBBCWU rectal and  colon  . COLONOSCOPY N/A 09/05/2014   Procedure: COLONOSCOPY;  Surgeon: Daneil Dolin, MD;  [facility];  Service: Endoscopy;  Laterality: N/A;  7:30-rescheduled 9/17 to [place] notified pt  . CYSTOSCOPY WITH STENT PLACEMENT Right 01/27/2016   Procedure: CYSTOSCOPY WITH STENT PLACEMENT;  Surgeon: [name], MD;  [facility];  Service: Urology;  Laterality: Right;  . CYSTOSCOPY/RETROGRADE/URETEROSCOPY/STONE EXTRACTION WITH BASKET Right 01/27/2016   Procedure: CYSTOSCOPY, RIGHT RETROGRADE, RIGHT URETEROSCOPY, STONE EXTRACTION ;  Surgeon: [name], MD;  [facility];  Service: Urology;   Laterality: Right;  . ESOPHAGOGASTRODUODENOSCOPY  10/15/2008     Dr Rourk:Schatzki's ring status post dilation and disruption via 58 F Maloney dilator/ otherwise unremarkable esophagus, small hiatal hernia, multiple fundal gland polyps not manipulated, gastritis, negative H.pylori  . ESOPHAGOGASTRODUODENOSCOPY  06/21/02   [facility]:GYJEH sliding hiatal hernia with mild changes of reflux esophagitis limited to gastroesophageal junction.  Noncritical ring at distal esophagus, 3 cm proximal to gastroesophageal junction/Antral gastritis  . [place]   "broke face playing softball"  . FRACTURE SURGERY     "left wrist; broke it; took spur off"  . HOLMIUM LASER APPLICATION Right 05/22/1496   Procedure: HOLMIUM LASER APPLICATION;  Surgeon: [name], MD;  [facility];  Service: Urology;  Laterality: Right;  . KNEE ARTHROSCOPY Right 05/18/2018   Procedure: PARTIAL MEDIAL MENISECTOMY AND SURGICAL LAVAGE AND CHONDROPLASTY;  Surgeon: [name], MD;  Location: [facility];  Service: Orthopedics;  Laterality: Right;  . LEFT HEART CATHETERIZATION WITH CORONARY ANGIOGRAM N/A 12/27/2013   Procedure: LEFT HEART CATHETERIZATION WITH CORONARY ANGIOGRAM;  Surgeon: [name], MD;  Location: [facility];  Service: Cardiovascular;  Laterality: N/A;  . neck epidural    . POSTERIOR LUMBAR FUSION 4 LEVEL N/A 03/28/2018   Procedure: Thoracic eight -Lumbar two- FIXATION WITH SCREW PLACEMENT, DECOMPRESSION Thoracic ten-Thoracic eleven  FOR OSTEOMYELITIS;  Surgeon: Kristeen Miss, MD;  Location: [facility];  Service: Neurosurgery;  Laterality: N/A;  . SHOULDER SURGERY Bilateral   . TEE WITHOUT CARDIOVERSION N/A 03/21/2018   Procedure: TRANSESOPHAGEAL ECHOCARDIOGRAM (TEE) WITH PROPOFOL;  Surgeon: [name], MD;  [facility];  Service: Cardiovascular;  Laterality: N/A;    There were no vitals filed for this visit.      [facility] PT Assessment - 06/25/19 1006      Assessment   Medical  Diagnosis  T10-11 Epidural Abscess "Paraplegia"    Referring Provider (PT)  Asencion Noble, MD    Onset Date/Surgical Date  03/28/18    Next MD Visit  He has appts with Willey Blade, Dr. Ellene Route, and Dr. Bridget Hartshorn coming up in the next month. He [facility] this past [facility] and reports that was good.     Prior Therapy  Yes, inpatient rehab and home health which patient reported ended 08/04/18      Precautions   Precautions  Fall      Restrictions   Weight Bearing Restrictions  No      Home Environment   Living Environment  Private residence    Living Arrangements  Spouse/significant other    Type of [place] entrance    [place] to live on main level with bedroom/bathroom    [place] - 2 wheels;[place] - 4 wheels;Wheelchair - manual;Bedside commode;Electric scooter      Prior Function   Level  of Independence  Needs assistance with ADLs;Independent with household mobility with device;Independent with community mobility with device    Vocation  Retired      Associate Professor   Overall Cognitive Status  Within Functional Limits for tasks assessed      Posture/Postural Control   Posture/Postural Control  Postural limitations    Postural Limitations  Rounded Shoulders;Forward head;Increased thoracic kyphosis;Decreased lumbar lordosis      Strength   Right Hip Flexion  5/5   was 4/5 on 5/13   Right Hip Extension  2+/5   was 2+/5   Right Hip ABduction  3+/5   was 3-/5   Left Hip Flexion  4+/5   was 4-/5   Left Hip Extension  2+/5   was 2+/5   Left Hip ABduction  3-/5   was 3-/5   Right Knee Flexion  4+/5   was 4+/5   Right Knee Extension  5/5   was 5/5   Left Knee Flexion  4-/5   was 4-/5   Left Knee Extension  4/5   was 4/5   Right Ankle Dorsiflexion  5/5   was 5/5   Left Ankle Dorsiflexion  4/5   4+     Flexibility   Soft Tissue Assessment /Muscle Length  yes   pt with tight hip flexors and adductors      Bed Mobility   Rolling Right  Independent    Rolling Left  Independent    Supine to Sit  Independent    Sit to Supine  Independent      Transfers   Sit to Stand  5: Supervision      Ambulation/Gait   Ambulation/Gait  Yes    Ambulation/Gait Assistance  4: Min guard    Ambulation/Gait Assistance Details  completed in 3:46 with wheelchair follow    Ambulation Distance (Feet)  112 Feet   was 80 feet    Assistive device  Rolling walker    Gait Pattern  Decreased dorsiflexion - left;Decreased dorsiflexion - right;Trunk flexed;Decreased step length - right;Decreased step length - left;Step-through pattern    Gait Comments  --                           PT Education - 06/25/19 1719    Education Details  importance of continuing a regular exericse program, sticking to program in order to see benefits.  Reviewed exercises and explained goal completion.    Person(s) Educated  Patient    Methods  Explanation    Comprehension  Verbalized understanding       PT Short Term Goals - 06/25/19 1115      PT SHORT TERM GOAL #1   Title  Patient will participate in HEP daily and will be able to instruct therapist in exercises for stretching and strengthening to improve mobility.    Baseline  6/19: pt reports doing some at home    Time  3    Period  Weeks    Status  Achieved    Target Date  05/23/19      PT SHORT TERM GOAL #2   Title  Patient will report or demonstrate ability to perform ambulation for 5 minutes with rollator with no more than supervision assistance without rest break and reported no more than minimal difficulty.     Baseline  6/19: 3:42, 3:39 with RW   7/6: 3:46 with RW    Time  3  Period  Weeks    Status  Not Met        PT Long Term Goals - 06/25/19 1116      PT LONG TERM GOAL #1   Title  Patient will report or demonstrate ability to perform ambulation for 8 minutes with LRAD with no more than supervision assistance without rest break and  reported no more than minimal difficulty.     Baseline  6/19: 3:42 straight, 3:39 straight with RW, supv-min guard    Time  6    Period  Weeks    Status  Not Met      PT LONG TERM GOAL #2   Title  Patient will demonstrate ability to stand at a support surface for 10 minutes without a rest break in order to perform household chores and ADL's with increased ease.     Baseline  01/24/19 = reports he can do this with support surface; 6/19: demo'd this today in clinic    Time  6    Period  Weeks    Status  Achieved      PT LONG TERM GOAL #3   Title  Patient will demonstrate ability to ambulate 120 feet without rest break using LRAD with no more than minimal assistance.    Baseline  6/19: 139f total with 1 seated break in between  7/6:  max distance of 112 feet without rest break    Time  6    Period  Weeks    Status  Not Met      PT LONG TERM GOAL #4   Title  Patient will be participating in exercise program at home or [facility], 40-60 minutes of moderate aerobic exercise in addition to therapy, to improve health/wellness in order to lose weight for improved mobility and function.    Baseline  6/19: has not gone yet  7[facility]6:  [facility]    Time  6    Period  Weeks    Status  Partially Met      PT LONG TERM GOAL #5   Title  Patient will demonstrate safe transfer to/from arm bike at home to be able to safely exercise for weight loss program to improve health/wellness and increase physical activity at home.    Baseline  6/19: pt's arm bike is portable system that he can use for UE or LE, does not have to transfer to use    Time  6    Period  Weeks    Status  Deferred            Plan - 06/25/19 1724    Clinical Impression Statement  Pt reassessed this session for discharge.  Pt has met 1/2 STG's and 2/4 LTG's.  Pt fell short of completing ambulation goal as he was was unable to ambulate greater than 5 minutes without rest break.  Pt did make improvements, however with  increased Rt hip and bilateral hip flexor strength of 1/2 grade.  [facility] and verbalizes understanding of completing regular exercise to improve overall functional and mental well being.    Rehab Potential  Fair    Clinical Impairments Affecting Rehab Potential  Positive: Highly motivated; Negative: Comorbidities    PT Frequency  2x / week    PT Duration  2 weeks    PT Treatment/Interventions  ADLs/Self Care Home Management;Aquatic Therapy;DME Instruction;Gait training;Functional mobility training;Stair training;Therapeutic activities;Therapeutic exercise;Balance training;Neuromuscular re-education;Patient/family education;Orthotic Fit/Training;Wheelchair mobility training;Manual techniques;Passive range  of motion;Energy conservation;Dry needling;Splinting;Taping    PT Next Visit Plan  discharge to HEP per POC.    PT Home Exercise Plan  08/16/18:  seated DF/PF, LAQ, marching, abd with theraband and bridge; 11/13: HS and calf stretching (says he has been performing this at home); 01/24/2019 = prone hip flexor stretch; 3/12: eccentric STS    Consulted and Agree with Plan of Care  Patient       Patient will benefit from skilled therapeutic intervention in order to improve the following deficits and impairments:  Abnormal gait, Decreased balance, Decreased endurance, Decreased mobility, Difficulty walking, Hypomobility, Decreased range of motion, Decreased activity tolerance, Decreased strength, Impaired flexibility  Visit Diagnosis: 1. Other abnormalities of gait and mobility   2. Muscle weakness (generalized)   3. Other symptoms and signs involving the musculoskeletal system        Problem List Patient Active Problem List   Diagnosis Date Noted  . Pseudogout of knee   . Pyogenic arthritis of right knee joint ([place])   . Falls frequently 05/08/2018  . Fall   . Sleep disturbance   . Post-op pain   . Hypokalemia   . Morbid obesity ([place])   . Postoperative  pain   . DVT, lower extremity, distal, acute, bilateral ([place])   . Acute blood loss anemia   . Anemia of chronic disease   . Essential hypertension   . Bacteremia   . Myelopathy ([place]) 03/30/2018  . Paraplegia ([place])   . Postlaminectomy syndrome, lumbar region   . Epidural abscess   . Abdominal distension   . Encephalopathy   . Weakness of both lower extremities   . Discitis of thoracic region   . Spinal cord stimulator status   . Staphylococcus aureus sepsis ([place]) 03/20/2018  . Bacteremia due to methicillin susceptible Staphylococcus aureus (MSSA) 03/18/2018  . Obesity 03/18/2018  . Nephrolithiasis 03/16/2018  . AKI (acute kidney injury) ([place]) 03/16/2018  . Cognitive impairment 03/16/2018  . Chronic pain syndrome 03/16/2018  . B12 deficiency 01/28/2016  . Memory difficulty 07/23/2015  . History of cerebrovascular disease 07/23/2015  . FH: colon cancer 08/13/2014  . Hx of adenomatous colonic polyps 08/13/2014  . Chronic back pain 05/30/2012  . CAD, NATIVE VESSEL 07/17/2010  . HYPERCHOLESTEROLEMIA 10/31/2009  . SMOKELESS TOBACCO ABUSE 10/31/2009  . Obstructive sleep apnea 10/31/2009  . Essential hypertension, benign 10/31/2009  . GERD 10/31/2009   Teena Irani, PTA/CLT [phone]  Teena Irani 06/25/2019, 5:33 PM  [facility] [address] [place], [place], [address] Phone: [phone]   Fax:  [phone]  Name: Cody Velez MRN: [id number] Date of Birth: 06-28-1946

## 2019-06-29 ENCOUNTER — Encounter (HOSPITAL_COMMUNITY): Payer: Medicare Other

## 2019-07-02 ENCOUNTER — Encounter (HOSPITAL_COMMUNITY): Payer: Medicare Other

## 2019-07-05 ENCOUNTER — Encounter (HOSPITAL_COMMUNITY): Payer: Medicare Other

## 2019-07-09 ENCOUNTER — Encounter (HOSPITAL_COMMUNITY): Payer: Medicare Other

## 2019-07-12 ENCOUNTER — Encounter (HOSPITAL_COMMUNITY): Payer: Medicare Other

## 2019-07-16 ENCOUNTER — Encounter (HOSPITAL_COMMUNITY): Payer: Medicare Other

## 2019-07-19 ENCOUNTER — Encounter (HOSPITAL_COMMUNITY): Payer: Medicare Other

## 2019-07-30 ENCOUNTER — Telehealth: Payer: Self-pay | Admitting: *Deleted

## 2019-07-30 NOTE — Telephone Encounter (Signed)
I am in agreement with your clinical judgment and recommendations.

## 2019-07-30 NOTE — Telephone Encounter (Signed)
RN reached out to patient regarding his mychart message. Cody Velez said he decided to restart cephalexin 8/8 - 8/10 for self-treatment of suspected urinary tract infection. He said the symptoms got better after a few days, is stopping the antibiotic today.  RN advised against this in the future and asked him to be seen at [facility] or even a televisit if he had symptoms like this again, but he was ambivalent about agreeing to it. RN confirmed upcoming appointment 9/16. Cody Gandy, RN  Message from 07/28/2019 Dr Megan Salon    Woke up this morning with an extreme urge to pee. I urinated but have pain in my pelvis area and feel like I have to urinate all the time.  I've been drinking bottles of water and juices all morning.    I took a Cephalexin just now.    Help Please

## 2019-08-01 ENCOUNTER — Telehealth: Payer: Self-pay | Admitting: Cardiology

## 2019-08-01 NOTE — Telephone Encounter (Signed)
Virtual Visit Pre-Appointment Phone Call  "(Name), I am calling you today to discuss your upcoming appointment. We are currently trying to limit exposure to the virus that causes COVID-19 by seeing patients at home rather than in the office."  "What is the BEST phone number to call the day of the visit?" - [phone]  1. Do you have or have access to (through a family member/friend) a smartphone with video capability that we can use for your visit?" a. If yes - list this number in appt notes as cell (if different from BEST phone #) and list the appointment type as a VIDEO visit in appointment notes b. If no - list the appointment type as a PHONE visit in appointment notes  2. Confirm consent - "In the setting of the current Covid19 crisis, you are scheduled for a (phone or video) visit with your provider on (date) at (time).  Just as we do with many in-office visits, in order for you to participate in this visit, we must obtain consent.  If you'd like, I can send this to your mychart (if signed up) or email for you to review.  Otherwise, I can obtain your verbal consent now.  All virtual visits are billed to your insurance company just like a normal visit would be.  By agreeing to a virtual visit, we'd like you to understand that the technology does not allow for your provider to perform an examination, and thus may limit your provider's ability to fully assess your condition. If your provider identifies any concerns that need to be evaluated in person, we will make arrangements to do so.  Finally, though the technology is pretty good, we cannot assure that it will always work on either your or our end, and in the setting of a video visit, we may have to convert it to a phone-only visit.  In either situation, we cannot ensure that we have a secure connection.  Are you willing to proceed?" STAFF: Did the patient verbally acknowledge consent to telehealth visit? Document YES/NO  here:YES  3. Advise patient to be prepared - "Two hours prior to your appointment, go ahead and check your blood pressure, pulse, oxygen saturation, and your weight (if you have the equipment to check those) and write them all down. When your visit starts, your provider will ask you for this information. If you have an Apple Watch or Kardia device, please plan to have heart rate information ready on the day of your appointment. Please have a pen and paper handy nearby the day of the visit as well."  4. Give patient instructions for MyChart download to smartphone OR Doximity/Doxy.me as below if video visit (depending on what platform provider is using)  5. Inform patient they will receive a phone call 15 minutes prior to their appointment time (may be from unknown caller ID) so they should be prepared to answer    TELEPHONE CALL NOTE  Cody Velez has been deemed a candidate for a follow-up tele-health visit to limit community exposure during the Covid-19 pandemic. I spoke with the patient via phone to ensure availability of phone/video source, confirm preferred email & phone number, and discuss instructions and expectations.  I reminded Cody Velez to be prepared with any vital sign and/or heart rhythm information that could potentially be obtained via home monitoring, at the time of his visit. I reminded Cody Velez to expect a phone call prior to his visit.  Chanda Busing  08/01/2019 10:08 AM   INSTRUCTIONS FOR DOWNLOADING THE MYCHART APP TO SMARTPHONE  - The patient must first make sure to have activated MyChart and know their login information - If Apple, go to [employer] and type in MyChart in the search bar and download the app. If Android, ask patient to go to [employer] and type in [place] in the search bar and download the app. The app is free but as with any other app downloads, their phone may require them to verify saved payment information or Apple/Android  password.  - The patient will need to then log into the app with their MyChart username and password, and select [facility] as their healthcare provider to link the account. When it is time for your visit, go to the MyChart app, find appointments, and click Begin Video Visit. Be sure to Select Allow for your device to access the Microphone and Camera for your visit. You will then be connected, and your provider will be with you shortly.  **If they have any issues connecting, or need assistance please contact MyChart service desk [phone])**  **If using a computer, in order to ensure the best quality for their visit they will need to use either of the following Internet Browsers: [employer], or Google Chrome**  IF USING DOXIMITY or DOXY.ME - The patient will receive a link just prior to their visit by text.     FULL LENGTH CONSENT FOR TELE-HEALTH VISIT   I hereby voluntarily request, consent and authorize [facility] and its employed or contracted physicians, physician assistants, nurse practitioners or other licensed health care professionals (the Practitioner), to provide me with telemedicine health care services (the Services") as deemed necessary by the treating Practitioner. I acknowledge and consent to receive the Services by the Practitioner via telemedicine. I understand that the telemedicine visit will involve communicating with the Practitioner through live audiovisual communication technology and the disclosure of certain medical information by electronic transmission. I acknowledge that I have been given the opportunity to request an in-person assessment or other available alternative prior to the telemedicine visit and am voluntarily participating in the telemedicine visit.  I understand that I have the right to withhold or withdraw my consent to the use of telemedicine in the course of my care at any time, without affecting my right to future care or treatment,  and that the Practitioner or I may terminate the telemedicine visit at any time. I understand that I have the right to inspect all information obtained and/or recorded in the course of the telemedicine visit and may receive copies of available information for a reasonable fee.  I understand that some of the potential risks of receiving the Services via telemedicine include:   Delay or interruption in medical evaluation due to technological equipment failure or disruption;  Information transmitted may not be sufficient (e.g. poor resolution of images) to allow for appropriate medical decision making by the Practitioner; and/or   In rare instances, security protocols could fail, causing a breach of personal health information.  Furthermore, I acknowledge that it is my responsibility to provide information about my medical history, conditions and care that is complete and accurate to the best of my ability. I acknowledge that Practitioner's advice, recommendations, and/or decision may be based on factors not within their control, such as incomplete or inaccurate data provided by me or distortions of diagnostic images or specimens that may result from electronic transmissions. I understand that the practice of medicine  is not an Chief Strategy Officer and that Practitioner makes no warranties or guarantees regarding treatment outcomes. I acknowledge that I will receive a copy of this consent concurrently upon execution via email to the email address I last provided but may also request a printed copy by calling the office of [facility].    I understand that my insurance will be billed for this visit.   I have read or had this consent read to me.  I understand the contents of this consent, which adequately explains the benefits and risks of the Services being provided via telemedicine.   I have been provided ample opportunity to ask questions regarding this consent and the Services and have had my questions  answered to my satisfaction.  I give my informed consent for the services to be provided through the use of telemedicine in my medical care  By participating in this telemedicine visit I agree to the above.

## 2019-08-08 ENCOUNTER — Encounter: Payer: Self-pay | Admitting: Cardiology

## 2019-08-08 ENCOUNTER — Encounter: Payer: Self-pay | Admitting: *Deleted

## 2019-08-08 ENCOUNTER — Telehealth (INDEPENDENT_AMBULATORY_CARE_PROVIDER_SITE_OTHER): Payer: Medicare Other | Admitting: Cardiology

## 2019-08-08 VITALS — Ht 75.0 in | Wt 315.0 lb

## 2019-08-08 DIAGNOSIS — E782 Mixed hyperlipidemia: Secondary | ICD-10-CM

## 2019-08-08 DIAGNOSIS — I10 Essential (primary) hypertension: Secondary | ICD-10-CM

## 2019-08-08 DIAGNOSIS — I2510 Atherosclerotic heart disease of native coronary artery without angina pectoris: Secondary | ICD-10-CM | POA: Diagnosis not present

## 2019-08-08 NOTE — Patient Instructions (Addendum)

## 2019-08-08 NOTE — Progress Notes (Signed)
Virtual Visit via Telephone Note   This visit type was conducted due to national recommendations for restrictions regarding the COVID-19 Pandemic (e.g. social distancing) in an effort to limit this patient's exposure and mitigate transmission in our community.  Due to his co-morbid illnesses, this patient is at least at moderate risk for complications without adequate follow up.  This format is felt to be most appropriate for this patient at this time.  The patient did not have access to video technology/had technical difficulties with video requiring transitioning to audio format only (telephone).  All issues noted in this document were discussed and addressed.  No physical exam could be performed with this format.  Please refer to the patient's chart for his  consent to telehealth for [facility].   Date:  08/08/2019   ID:  Cody Velez, DOB February 13, 1946, MRN [id number]  Patient Location: Home Provider Location: Office  PCP:  Asencion Noble, MD  Cardiologist:  Rozann Lesches, MD Electrophysiologist:  None   Evaluation Performed:  Follow-Up Visit  Chief Complaint:   Cardiac follow-up  History of Present Illness:    Cody Velez is a 73 y.o. male last seen in February.  He did not have video access and we spoke by phone today.  Cardiac perspective, he does not report any angina symptoms at this time.  He is still confined to a wheelchair and has pending assessment for modifications to his house to improve his mobility and ADLs through the [facility] system.  I reviewed his medications which are outlined below.  Cardiac regimen is stable and includes aspirin, Norvasc, Cozaar, and Pravachol.  He had lab work with Dr. Willey Blade in the interim which we are requesting for review.  The patient does not have symptoms concerning for COVID-19 infection (fever, chills, cough, or new shortness of breath).    Past Medical History:  Diagnosis Date  . Allergic rhinitis   . Arthritis   . B12  deficiency   . Cervicogenic headache 01/28/2016  . Childhood asthma   . Chronic back pain   . Coronary atherosclerosis of native coronary artery    Mild nonobstructive CAD at catheterization January 2015  . Depression   . Essential hypertension   . Falls   . GERD (gastroesophageal reflux disease)   . History of cerebrovascular disease 07/23/2015  . History of pneumonia 02/2011  . Hyperlipidemia   . Kidney stone   . Memory difficulty 07/23/2015  . OSA (obstructive sleep apnea)    CPAP - Dr. [name]  . Prostate cancer ([place])   . PTSD (post-traumatic stress disorder)    [place]  . Rectal bleeding    Past Surgical History:  Procedure Laterality Date  . APPLICATION OF ROBOTIC ASSISTANCE FOR SPINAL PROCEDURE N/A 03/28/2018   Procedure: APPLICATION OF ROBOTIC ASSISTANCE FOR SPINAL PROCEDURE;  Surgeon: Kristeen Miss, MD;  Location: [place];  Service: Neurosurgery;  Laterality: N/A;  . BACK SURGERY  02/14/12   lumbar OR #7; "today redid L1L2; replaced screws; added bone from hip"  . BILATERAL KNEE ARTHROSCOPY    . COLONOSCOPY  10/15/2008   Dr. [name]: tubular adenoma   . COLONOSCOPY  12/17/2003   RJJ:OACZYS rectal and colon  . COLONOSCOPY N/A 09/05/2014   Procedure: COLONOSCOPY;  Surgeon: Daneil Dolin, MD;  [facility];  Service: Endoscopy;  Laterality: N/A;  7:30-rescheduled 9/17 to [place] notified pt  . CYSTOSCOPY WITH STENT PLACEMENT Right 01/27/2016   Procedure: CYSTOSCOPY WITH STENT PLACEMENT;  Surgeon: [name], MD;  Location: [facility];  Service: Urology;  Laterality: Right;  . CYSTOSCOPY/RETROGRADE/URETEROSCOPY/STONE EXTRACTION WITH BASKET Right 01/27/2016   Procedure: CYSTOSCOPY, RIGHT RETROGRADE, RIGHT URETEROSCOPY, STONE EXTRACTION ;  Surgeon: [name], MD;  Location: [facility];  Service: Urology;  Laterality: Right;  . ESOPHAGOGASTRODUODENOSCOPY  10/15/2008     Dr Rourk:Schatzki's ring status post dilation and disruption via 60 F Maloney dilator/ otherwise  unremarkable esophagus, small hiatal hernia, multiple fundal gland polyps not manipulated, gastritis, negative H.pylori  . ESOPHAGOGASTRODUODENOSCOPY  06/21/02   WLN:LGXQJ sliding hiatal hernia with mild changes of reflux esophagitis limited to gastroesophageal junction.  Noncritical ring at distal esophagus, 3 cm proximal to gastroesophageal junction/Antral gastritis  . [place]   "broke face playing softball"  . FRACTURE SURGERY     "left wrist; broke it; took spur off"  . HOLMIUM LASER APPLICATION Right 12/28/4172   Procedure: HOLMIUM LASER APPLICATION;  Surgeon: [name], MD;  Location: [facility];  Service: Urology;  Laterality: Right;  . KNEE ARTHROSCOPY Right 05/18/2018   Procedure: PARTIAL MEDIAL MENISECTOMY AND SURGICAL LAVAGE AND CHONDROPLASTY;  Surgeon: [name], MD;  Location: [facility];  Service: Orthopedics;  Laterality: Right;  . LEFT HEART CATHETERIZATION WITH CORONARY ANGIOGRAM N/A 12/27/2013   Procedure: LEFT HEART CATHETERIZATION WITH CORONARY ANGIOGRAM;  Surgeon: [name], MD;  Location: [facility];  Service: Cardiovascular;  Laterality: N/A;  . neck epidural    . POSTERIOR LUMBAR FUSION 4 LEVEL N/A 03/28/2018   Procedure: Thoracic eight -Lumbar two- FIXATION WITH SCREW PLACEMENT, DECOMPRESSION Thoracic ten-Thoracic eleven  FOR OSTEOMYELITIS;  Surgeon: Kristeen Miss, MD;  Location: [facility];  Service: Neurosurgery;  Laterality: N/A;  . SHOULDER SURGERY Bilateral   . TEE WITHOUT CARDIOVERSION N/A 03/21/2018   Procedure: TRANSESOPHAGEAL ECHOCARDIOGRAM (TEE) WITH PROPOFOL;  Surgeon: [name], MD;  [facility];  Service: Cardiovascular;  Laterality: N/A;     Current Meds  Medication Sig  . alfuzosin (UROXATRAL) 10 MG 24 hr tablet Take 10 mg by mouth daily with breakfast.  . amLODipine (NORVASC) 10 MG tablet Take 1 tablet (10 mg total) by mouth daily.  Marland Kitchen aspirin 81 MG tablet Take 1 tablet (81 mg total) by mouth daily.  . baclofen  (LIORESAL) 10 MG tablet TAKE ONE TABLET IN THE MORNING AND MIDDAY, THEN TWO TABLETS AT NIGHT  . colchicine 0.6 MG tablet Take 1 tablet (0.6 mg total) by mouth daily.  . cycloSPORINE (RESTASIS) 0.05 % ophthalmic emulsion 1 DROP INTO BOTH EYES TWICE A DAY FOR DRY EYES  . docusate sodium (COLACE) 100 MG capsule Take 2 capsules (200 mg total) by mouth 2 (two) times daily.  Marland Kitchen donepezil (ARICEPT) 10 MG tablet Take 1 tablet (10 mg total) by mouth at bedtime.  . finasteride (PROSCAR) 5 MG tablet Take 1 tablet (5 mg total) by mouth daily. Reported on 05/04/2016  . fluticasone (FLONASE) 50 MCG/ACT nasal spray Place 1 spray into both nostrils daily as needed for allergies.  Marland Kitchen gabapentin (NEURONTIN) 400 MG capsule Take 1 capsule (400 mg total) by mouth 3 (three) times daily. (Patient taking differently: Take 400 mg by mouth 2 (two) times daily. )  . hydrochlorothiazide (HYDRODIURIL) 25 MG tablet Take 25 mg by mouth daily.   Marland Kitchen latanoprost (XALATAN) 0.005 % ophthalmic solution Place 1 drop into both eyes at bedtime.  Marland Kitchen losartan (COZAAR) 100 MG tablet Take 100 mg by mouth daily.  . Melatonin 3 MG TABS Take  0.5 tablets (1.5 mg total) by mouth at bedtime.  . memantine (NAMENDA) 10 MG tablet Take 1 tablet (10 mg total) by mouth 2 (two) times daily.  Marland Kitchen omeprazole (PRILOSEC) 20 MG capsule Take 2 capsules (40 mg total) by mouth daily.  . Oxycodone HCl 10 MG TABS Take 1 tablet (10 mg total) by mouth every 4 (four) hours as needed (moderate to severe pain).  . polyethylene glycol (MIRALAX / GLYCOLAX) packet Take 17 g by mouth daily.  . potassium chloride SA (K-DUR,KLOR-CON) 20 MEQ tablet Take 1 tablet (20 mEq total) by mouth daily.  . pravastatin (PRAVACHOL) 10 MG tablet Take 1 tablet (10 mg total) by mouth every evening.  . traZODone (DESYREL) 100 MG tablet Take 1 tablet (100 mg total) by mouth at bedtime.  Marland Kitchen venlafaxine XR (EFFEXOR-XR) 150 MG 24 hr capsule Take 1 capsule (150 mg total) by mouth 2 (two) times daily.      Allergies:   Patient has no known allergies.   Social History   Tobacco Use  . Smoking status: Former Smoker    Types: Cigarettes    Quit date: 12/20/1958    Years since quitting: 60.6  . Smokeless tobacco: Current User    Types: Chew  . Tobacco comment: Quit chewing 04/2015  Substance Use Topics  . Alcohol use: Yes    Alcohol/week: 0.0 standard drinks    Comment: couple bottles of wine per month  . Drug use: No    Types: Marijuana    Comment: "last used marijuana ~ 1969"     Family Hx: The patient's family history includes Asthma in an other family member; Breast cancer in his mother; CAD in his father; Colon cancer in his mother; Dementia in his paternal uncle; Emphysema in his father and maternal grandmother; Heart attack in his brother; Heart disease in his paternal grandfather; Heart failure in his father; Lung cancer in his father; Stroke in his maternal grandmother, mother, and sister. There is no history of Anesthesia problems, Hypotension, Malignant hyperthermia, or Pseudochol deficiency.  ROS:   Please see the history of present illness. All other systems reviewed and are negative.   Prior CV studies:   The following studies were reviewed today:  TEE 03/21/2018: Study Conclusions  - Procedure narrative: Transesophageal echocardiography. Topical anesthesia was obtained using viscous lidocaine. A transesophageal probe was inserted by the attending cardiologist. Image quality was poor. This was a technically difficult study. - Left ventricle: Systolic function was normal. The estimated ejection fraction was in the range of 55% to 60%. - Descending aorta: The descending aorta had moderate diffuse disease. - Mitral valve: There was trivial regurgitation. - Left atrium: No evidence of thrombus in the atrial cavity or appendage. - Tricuspid valve: There was trivial regurgitation. - Pulmonic valve: There was trivial regurgitation.  Impressions:  -  Images were overall limited. Valves best visualized with nonstandard views. There were no obvious valvular vegetations.  Labs/Other Tests and Data Reviewed:    EKG:  An ECG dated 02/08/2019 was personally reviewed today and demonstrated:  Sinus rhythm with PACs and IVCD.  Recent Labs: 01/18/2019: BUN 10; Creat 1.10; Hemoglobin 15.1; Platelets 350; Potassium 3.8; Sodium 141    Wt Readings from Last 3 Encounters:  08/08/19 (!) 315 lb (142.9 kg)  05/28/19 (!) 313 lb (142 kg)  02/16/19 (!) 308 lb (139.7 kg)     Objective:    Vital Signs:  Ht 6\' 3"  (1.905 m)   Wt (!) 315 lb (142.9 kg)  BMI 39.37 kg/m    He did not have a way to check vital signs today. Patient spoke in full sentences on the phone, not short of breath. No audible wheezing or coughing. Speech pattern normal.  ASSESSMENT & PLAN:    1.  Mild, nonobstructive CAD by previous work-up being managed medically at this point in the absence of active angina symptoms.  Continue current medical regimen as detailed above.  2.  Mixed hyperlipidemia.  He continues on Pravachol.  Requesting interval lab work from Dr. Willey Blade.  COVID-19 Education: The signs and symptoms of COVID-19 were discussed with the patient and how to seek care for testing (follow up with PCP or arrange E-visit).  The importance of social distancing was discussed today.  Time:   Today, I have spent 7 minutes with the patient with telehealth technology discussing the above problems.     Medication Adjustments/Labs and Tests Ordered: Current medicines are reviewed at length with the patient today.  Concerns regarding medicines are outlined above.   Tests Ordered: No orders of the defined types were placed in this encounter.   Medication Changes: No orders of the defined types were placed in this encounter.   Follow Up:  Virtual Visit or In Person 6 months in the [place] office.  Signed, Rozann Lesches, MD  08/08/2019 1:46 PM    [place]

## 2019-09-05 ENCOUNTER — Encounter: Payer: Self-pay | Admitting: Internal Medicine

## 2019-09-05 ENCOUNTER — Other Ambulatory Visit: Payer: Self-pay

## 2019-09-05 ENCOUNTER — Ambulatory Visit (INDEPENDENT_AMBULATORY_CARE_PROVIDER_SITE_OTHER): Payer: Medicare Other | Admitting: Internal Medicine

## 2019-09-05 DIAGNOSIS — M4644 Discitis, unspecified, thoracic region: Secondary | ICD-10-CM | POA: Diagnosis not present

## 2019-09-05 DIAGNOSIS — I2510 Atherosclerotic heart disease of native coronary artery without angina pectoris: Secondary | ICD-10-CM | POA: Diagnosis not present

## 2019-09-05 NOTE — Assessment & Plan Note (Signed)
The fact that he is doing better 3 months after completing antibiotic therapy is evidence that his infection has been cured.  He can follow-up here as needed.

## 2019-09-05 NOTE — Progress Notes (Signed)
[facility] for Infectious Disease  Patient Active Problem List   Diagnosis Date Noted  . Pseudogout of knee     Priority: High  . Discitis of thoracic region     Priority: High  . Bacteremia due to methicillin susceptible Staphylococcus aureus (MSSA) 03/18/2018    Priority: High  . Pyogenic arthritis of right knee joint ([place])   . Falls frequently 05/08/2018  . Fall   . Sleep disturbance   . Post-op pain   . Hypokalemia   . Morbid obesity ([place])   . Postoperative pain   . DVT, lower extremity, distal, acute, bilateral ([place])   . Acute blood loss anemia   . Anemia of chronic disease   . Essential hypertension   . Bacteremia   . Myelopathy ([place]) 03/30/2018  . Paraplegia ([place])   . Postlaminectomy syndrome, lumbar region   . Epidural abscess   . Abdominal distension   . Encephalopathy   . Weakness of both lower extremities   . Spinal cord stimulator status   . Staphylococcus aureus sepsis ([place]) 03/20/2018  . Obesity 03/18/2018  . Nephrolithiasis 03/16/2018  . AKI (acute kidney injury) ([place]) 03/16/2018  . Cognitive impairment 03/16/2018  . Chronic pain syndrome 03/16/2018  . B12 deficiency 01/28/2016  . Memory difficulty 07/23/2015  . History of cerebrovascular disease 07/23/2015  . FH: colon cancer 08/13/2014  . Hx of adenomatous colonic polyps 08/13/2014  . Chronic back pain 05/30/2012  . CAD, NATIVE VESSEL 07/17/2010  . HYPERCHOLESTEROLEMIA 10/31/2009  . SMOKELESS TOBACCO ABUSE 10/31/2009  . Obstructive sleep apnea 10/31/2009  . Essential hypertension, benign 10/31/2009  . GERD 10/31/2009    Patient's Medications  New Prescriptions   No medications on file  Previous Medications   ALFUZOSIN (UROXATRAL) 10 MG 24 HR TABLET    Take 10 mg by mouth daily with breakfast.   AMLODIPINE (NORVASC) 10 MG TABLET    Take 1 tablet (10 mg total) by mouth daily.   ASPIRIN 81 MG TABLET    Take 1 tablet (81 mg total) by mouth daily.   BACLOFEN (LIORESAL) 10 MG  TABLET    TAKE ONE TABLET IN THE MORNING AND MIDDAY, THEN TWO TABLETS AT NIGHT   COLCHICINE 0.6 MG TABLET    Take 1 tablet (0.6 mg total) by mouth daily.   CYCLOSPORINE (RESTASIS) 0.05 % OPHTHALMIC EMULSION    1 DROP INTO BOTH EYES TWICE A DAY FOR DRY EYES   DOCUSATE SODIUM (COLACE) 100 MG CAPSULE    Take 2 capsules (200 mg total) by mouth 2 (two) times daily.   DONEPEZIL (ARICEPT) 10 MG TABLET    Take 1 tablet (10 mg total) by mouth at bedtime.   FINASTERIDE (PROSCAR) 5 MG TABLET    Take 1 tablet (5 mg total) by mouth daily. Reported on 05/04/2016   FLUTICASONE (FLONASE) 50 MCG/ACT NASAL SPRAY    Place 1 spray into both nostrils daily as needed for allergies.   GABAPENTIN (NEURONTIN) 400 MG CAPSULE    Take 1 capsule (400 mg total) by mouth 3 (three) times daily.   HYDROCHLOROTHIAZIDE (HYDRODIURIL) 25 MG TABLET    Take 25 mg by mouth daily.    LATANOPROST (XALATAN) 0.005 % OPHTHALMIC SOLUTION    Place 1 drop into both eyes at bedtime.   LOSARTAN (COZAAR) 100 MG TABLET    Take 100 mg by mouth daily.   MELATONIN 3 MG TABS    Take 0.5 tablets (1.5 mg  total) by mouth at bedtime.   MEMANTINE (NAMENDA) 10 MG TABLET    Take 1 tablet (10 mg total) by mouth 2 (two) times daily.   OMEPRAZOLE (PRILOSEC) 20 MG CAPSULE    Take 2 capsules (40 mg total) by mouth daily.   OXYCODONE HCL 10 MG TABS    Take 1 tablet (10 mg total) by mouth every 4 (four) hours as needed (moderate to severe pain).   POLYETHYLENE GLYCOL (MIRALAX / GLYCOLAX) PACKET    Take 17 g by mouth daily.   POTASSIUM CHLORIDE SA (K-DUR,KLOR-CON) 20 MEQ TABLET    Take 1 tablet (20 mEq total) by mouth daily.   PRAVASTATIN (PRAVACHOL) 10 MG TABLET    Take 1 tablet (10 mg total) by mouth every evening.   TRAZODONE (DESYREL) 100 MG TABLET    Take 1 tablet (100 mg total) by mouth at bedtime.   VENLAFAXINE XR (EFFEXOR-XR) 150 MG 24 HR CAPSULE    Take 1 capsule (150 mg total) by mouth 2 (two) times daily.  Modified Medications   No medications on file   Discontinued Medications   No medications on file    Subjective: Cody Velez is in for his routine follow-up visit.  In March of last year he developed MSSA bacteremia complicated by thoracic discitis and lower extremity weakness.  He underwent incision and drainage and thoracolumbar fusion by Dr. Ellene Route.  He completed a long course of IV antibiotics and then converted to oral cephalexin and rifampin.  I had him stop taking rifampin 6 months ago.  He completed 15 months of therapy 3 months ago. He feels like he is making significant progress with his physical therapy.  He and his wife are going to the gym more frequently now.  Review of Systems: Review of Systems  Constitutional: Negative for chills, diaphoresis and fever.  Gastrointestinal: Negative for abdominal pain, diarrhea, nausea and vomiting.  Musculoskeletal: Positive for back pain.  Neurological: Positive for sensory change and focal weakness.    Past Medical History:  Diagnosis Date  . Allergic rhinitis   . Arthritis   . B12 deficiency   . Cervicogenic headache 01/28/2016  . Childhood asthma   . Chronic back pain   . Coronary atherosclerosis of native coronary artery    Mild nonobstructive CAD at catheterization January 2015  . Depression   . Essential hypertension   . Falls   . GERD (gastroesophageal reflux disease)   . History of cerebrovascular disease 07/23/2015  . History of pneumonia 02/2011  . Hyperlipidemia   . Kidney stone   . Memory difficulty 07/23/2015  . OSA (obstructive sleep apnea)    CPAP - Dr. [name]  . Prostate cancer ([place])   . PTSD (post-traumatic stress disorder)    [place]  . Rectal bleeding     Social History   Tobacco Use  . Smoking status: Former Smoker    Types: Cigarettes    Quit date: 12/20/1958    Years since quitting: 60.7  . Smokeless tobacco: Current User    Types: Chew  . Tobacco comment: Quit chewing 04/2015  Substance Use Topics  . Alcohol use: Yes    Alcohol/week: 0.0 standard drinks     Comment: couple bottles of wine per month  . Drug use: No    Types: Marijuana    Comment: "last used marijuana ~ 1969"    Family History  Problem Relation Age of Onset  . Emphysema Father   . Heart failure Father   . Lung  cancer Father   . CAD Father   . Colon cancer Mother   . Stroke Mother   . Breast cancer Mother   . Stroke Sister   . Heart attack Brother   . Dementia Paternal Uncle   . Emphysema Maternal Grandmother   . Stroke Maternal Grandmother   . Asthma Other        grandson  . Heart disease Paternal Grandfather   . Anesthesia problems Neg Hx   . Hypotension Neg Hx   . Malignant hyperthermia Neg Hx   . Pseudochol deficiency Neg Hx     No Known Allergies  Objective: Vitals:   09/05/19 1056  BP: (!) 141/82  Pulse: 64  Temp: 98.2 F (36.8 C)   There is no height or weight on file to calculate BMI.  Physical Exam Constitutional:      Comments: He is in very good spirits as usual.  He is seated in his wheelchair.    Abdominal:     General: There is no distension.     Palpations: Abdomen is soft.     Tenderness: There is no abdominal tenderness.  Musculoskeletal:     Comments: His back incision is well-healed.  There are no signs of inflammation or infection over his spine or the left-sided stimulator.  There are no new bony changes.  Skin:    Findings: No rash.  Neurological:     Mental Status: He is alert and oriented to person, place, and time.     Lab Results Sed Rate (mm/h)  Date Value  01/18/2019 17  08/30/2018 19  07/18/2018 33 (H)   CRP (mg/L)  Date Value  01/18/2019 7.2  08/30/2018 2.7  07/18/2018 7.0     Problem List Items Addressed This Visit      High   Discitis of thoracic region    The fact that he is doing better 3 months after completing antibiotic therapy is evidence that his infection has been cured.  He can follow-up here as needed.          Michel Bickers, MD [facility] for [facility] [phone] pager   [phone] cell 09/05/2019, 11:21 AM

## 2019-09-17 DIAGNOSIS — M4644 Discitis, unspecified, thoracic region: Secondary | ICD-10-CM | POA: Diagnosis not present

## 2019-09-17 DIAGNOSIS — I10 Essential (primary) hypertension: Secondary | ICD-10-CM | POA: Diagnosis not present

## 2019-09-18 DIAGNOSIS — Z23 Encounter for immunization: Secondary | ICD-10-CM | POA: Diagnosis not present

## 2019-10-17 ENCOUNTER — Other Ambulatory Visit: Payer: Self-pay

## 2019-10-17 ENCOUNTER — Ambulatory Visit (INDEPENDENT_AMBULATORY_CARE_PROVIDER_SITE_OTHER): Payer: Medicare Other | Admitting: Neurology

## 2019-10-17 ENCOUNTER — Encounter: Payer: Self-pay | Admitting: Neurology

## 2019-10-17 VITALS — BP 124/70 | HR 55 | Temp 97.5°F | Wt 310.0 lb

## 2019-10-17 DIAGNOSIS — I2510 Atherosclerotic heart disease of native coronary artery without angina pectoris: Secondary | ICD-10-CM | POA: Diagnosis not present

## 2019-10-17 DIAGNOSIS — R519 Headache, unspecified: Secondary | ICD-10-CM | POA: Diagnosis not present

## 2019-10-17 DIAGNOSIS — G4486 Cervicogenic headache: Secondary | ICD-10-CM

## 2019-10-17 DIAGNOSIS — R413 Other amnesia: Secondary | ICD-10-CM

## 2019-10-17 NOTE — Progress Notes (Signed)
Reason for visit: Mild cognitive impairment, cervicogenic headache  Cody Velez is an 73 y.o. male  History of present illness:  [name] is a 73 year old left-handed white male with a history of chronic neck pain and headache.  The patient also had low back pain issues, and in early 2019 he had a spinal stimulator placed but he had a complication with a MRSA infection that resulted in an epidural abscess and a thoracic myelopathy.  The patient has required extensive physical therapy, he is able to ambulate a short distance with a walker, but he is left with significant numbness of both legs.  He has some urinary urgency and urgency of the bowel as well.  He has required long-term treatment with antibiotics.  During his severe illness with sepsis, he had a delirium state, he feels that his memory issues have not fully returned following this.  The patient however has not given up any activities of daily living because of the his memory issues.  He remains cognitively active.  He returns to the office today for an evaluation.  Past Medical History:  Diagnosis Date  . Allergic rhinitis   . Arthritis   . B12 deficiency   . Cervicogenic headache 01/28/2016  . Childhood asthma   . Chronic back pain   . Coronary atherosclerosis of native coronary artery    Mild nonobstructive CAD at catheterization January 2015  . Depression   . Essential hypertension   . Falls   . GERD (gastroesophageal reflux disease)   . History of cerebrovascular disease 07/23/2015  . History of pneumonia 02/2011  . Hyperlipidemia   . Kidney stone   . Memory difficulty 07/23/2015  . OSA (obstructive sleep apnea)    CPAP - Dr. [name]  . Prostate cancer ([place])   . PTSD (post-traumatic stress disorder)    [place]  . Rectal bleeding     Past Surgical History:  Procedure Laterality Date  . APPLICATION OF ROBOTIC ASSISTANCE FOR SPINAL PROCEDURE N/A 03/28/2018   Procedure: APPLICATION OF ROBOTIC ASSISTANCE FOR  SPINAL PROCEDURE;  Surgeon: Kristeen Miss, MD;  Location: [facility];  Service: Neurosurgery;  Laterality: N/A;  . BACK SURGERY  02/14/12   lumbar OR #7; "today redid L1L2; replaced screws; added bone from hip"  . BILATERAL KNEE ARTHROSCOPY    . COLONOSCOPY  10/15/2008   Dr. [name]: tubular adenoma   . COLONOSCOPY  12/17/2003   [id number] rectal and colon  . COLONOSCOPY N/A 09/05/2014   Procedure: COLONOSCOPY;  Surgeon: Daneil Dolin, MD;  [facility];  Service: Endoscopy;  Laterality: N/A;  7:30-rescheduled 9/17 to [place] notified pt  . CYSTOSCOPY WITH STENT PLACEMENT Right 01/27/2016   Procedure: CYSTOSCOPY WITH STENT PLACEMENT;  Surgeon: [name], MD;  Location: [facility];  Service: Urology;  Laterality: Right;  . CYSTOSCOPY/RETROGRADE/URETEROSCOPY/STONE EXTRACTION WITH BASKET Right 01/27/2016   Procedure: CYSTOSCOPY, RIGHT RETROGRADE, RIGHT URETEROSCOPY, STONE EXTRACTION ;  Surgeon: [name], MD;  Location: [facility];  Service: Urology;  Laterality: Right;  . ESOPHAGOGASTRODUODENOSCOPY  10/15/2008     Dr Rourk:Schatzki's ring status post dilation and disruption via 1 F Maloney dilator/ otherwise unremarkable esophagus, small hiatal hernia, multiple fundal gland polyps not manipulated, gastritis, negative H.pylori  . ESOPHAGOGASTRODUODENOSCOPY  06/21/02   [id number] sliding hiatal hernia with mild changes of reflux esophagitis limited to gastroesophageal junction.  Noncritical ring at distal esophagus, 3 cm proximal to gastroesophageal junction/Antral gastritis  . [place]   "  broke face playing softball"  . FRACTURE SURGERY     "left wrist; broke it; took spur off"  . HOLMIUM LASER APPLICATION Right [id number]   Procedure: HOLMIUM LASER APPLICATION;  Surgeon: [name], MD;  Location: [facility];  Service: Urology;  Laterality: Right;  . KNEE ARTHROSCOPY Right 05/18/2018   Procedure: PARTIAL MEDIAL MENISECTOMY AND SURGICAL LAVAGE AND  CHONDROPLASTY;  Surgeon: [name], MD;  Location: [facility];  Service: Orthopedics;  Laterality: Right;  . LEFT HEART CATHETERIZATION WITH CORONARY ANGIOGRAM N/A 12/27/2013   Procedure: LEFT HEART CATHETERIZATION WITH CORONARY ANGIOGRAM;  Surgeon: [name], MD;  Location: [facility] CATH [facility];  Service: Cardiovascular;  Laterality: N/A;  . neck epidural    . POSTERIOR LUMBAR FUSION 4 LEVEL N/A 03/28/2018   Procedure: Thoracic eight -Lumbar two- FIXATION WITH SCREW PLACEMENT, DECOMPRESSION Thoracic ten-Thoracic eleven  FOR OSTEOMYELITIS;  Surgeon: Kristeen Miss, MD;  Location: [facility];  Service: Neurosurgery;  Laterality: N/A;  . SHOULDER SURGERY Bilateral   . TEE WITHOUT CARDIOVERSION N/A 03/21/2018   Procedure: TRANSESOPHAGEAL ECHOCARDIOGRAM (TEE) WITH PROPOFOL;  Surgeon: [name], MD;  [facility];  Service: Cardiovascular;  Laterality: N/A;    Family History  Problem Relation Age of Onset  . Emphysema Father   . Heart failure Father   . Lung cancer Father   . CAD Father   . Colon cancer Mother   . Stroke Mother   . Breast cancer Mother   . Stroke Sister   . Heart attack Brother   . Dementia Paternal Uncle   . Emphysema Maternal Grandmother   . Stroke Maternal Grandmother   . Asthma Other        grandson  . Heart disease Paternal Grandfather   . Anesthesia problems Neg Hx   . Hypotension Neg Hx   . Malignant hyperthermia Neg Hx   . Pseudochol deficiency Neg Hx     Social history:  reports that he quit smoking about 60 years ago. His smoking use included cigarettes. His smokeless tobacco use includes chew. He reports current alcohol use. He reports that he does not use drugs.   No Known Allergies  Medications:  Prior to Admission medications   Medication Sig Start Date End Date Taking? Authorizing Provider  alfuzosin (UROXATRAL) 10 MG 24 hr tablet Take 10 mg by mouth daily with breakfast.   Yes [provider]  amLODipine (NORVASC) 10 MG tablet Take 1  tablet (10 mg total) by mouth daily. 05/22/18  Yes Bonnielee Haff, MD  aspirin 81 MG tablet Take 1 tablet (81 mg total) by mouth daily. 05/22/18  Yes Bonnielee Haff, MD  baclofen (LIORESAL) 10 MG tablet TAKE ONE TABLET IN THE MORNING AND MIDDAY, THEN TWO TABLETS AT NIGHT 06/12/18  Yes Kathrynn Ducking, MD  colchicine 0.6 MG tablet Take 1 tablet (0.6 mg total) by mouth daily. 05/23/18  Yes Bonnielee Haff, MD  cycloSPORINE (RESTASIS) 0.05 % ophthalmic emulsion 1 DROP INTO BOTH EYES TWICE A DAY FOR DRY EYES 05/22/18  Yes Bonnielee Haff, MD  docusate sodium (COLACE) 100 MG capsule Take 2 capsules (200 mg total) by mouth 2 (two) times daily. 05/22/18  Yes Bonnielee Haff, MD  donepezil (ARICEPT) 10 MG tablet Take 1 tablet (10 mg total) by mouth at bedtime. Patient taking differently: Take 10 mg by mouth 2 (two) times daily.  05/22/18  Yes Bonnielee Haff, MD  finasteride (PROSCAR) 5 MG tablet Take 1 tablet (5 mg total) by mouth daily. Reported on  05/04/2016 05/22/18  Yes Bonnielee Haff, MD  fluticasone (FLONASE) 50 MCG/ACT nasal spray Place 1 spray into both nostrils daily as needed for allergies. 05/22/18  Yes Bonnielee Haff, MD  gabapentin (NEURONTIN) 400 MG capsule Take 1 capsule (400 mg total) by mouth 3 (three) times daily. Patient taking differently: Take 400 mg by mouth 2 (two) times daily.  05/22/18  Yes Bonnielee Haff, MD  hydrochlorothiazide (HYDRODIURIL) 25 MG tablet Take 25 mg by mouth daily.  04/21/19  Yes [provider]  latanoprost (XALATAN) 0.005 % ophthalmic solution Place 1 drop into both eyes at bedtime. 05/22/18  Yes Bonnielee Haff, MD  losartan (COZAAR) 100 MG tablet Take 100 mg by mouth daily.   Yes [provider]  Melatonin 3 MG TABS Take 0.5 tablets (1.5 mg total) by mouth at bedtime. 05/22/18  Yes Bonnielee Haff, MD  memantine (NAMENDA) 10 MG tablet Take 1 tablet (10 mg total) by mouth 2 (two) times daily. 05/22/18  Yes Bonnielee Haff, MD  omeprazole (PRILOSEC) 20 MG capsule  Take 2 capsules (40 mg total) by mouth daily. 05/22/18  Yes Bonnielee Haff, MD  Oxycodone HCl 10 MG TABS Take 1 tablet (10 mg total) by mouth every 4 (four) hours as needed (moderate to severe pain). 05/22/18  Yes Bonnielee Haff, MD  polyethylene glycol [facility] / GLYCOLAX) packet Take 17 g by mouth daily. 05/23/18  Yes Bonnielee Haff, MD  potassium chloride SA (K-DUR,KLOR-CON) 20 MEQ tablet Take 1 tablet (20 mEq total) by mouth daily. 05/22/18  Yes Bonnielee Haff, MD  pravastatin (PRAVACHOL) 10 MG tablet Take 1 tablet (10 mg total) by mouth every evening. 05/22/18  Yes Bonnielee Haff, MD  traZODone (DESYREL) 100 MG tablet Take 1 tablet (100 mg total) by mouth at bedtime. 05/22/18  Yes Bonnielee Haff, MD  venlafaxine XR (EFFEXOR-XR) 150 MG 24 hr capsule Take 1 capsule (150 mg total) by mouth 2 (two) times daily. 05/22/18  Yes Bonnielee Haff, MD    ROS:  Out of a complete 14 system review of symptoms, the patient complains only of the following symptoms, and all other reviewed systems are negative.  Memory problems Low back pain, neck pain Walking difficulty Urgency of bowel and bladder  Blood pressure 124/70, pulse (!) 55, temperature (!) 97.5 F (36.4 C), temperature source Temporal, weight (!) 310 lb (140.6 kg), SpO2 94 %.  Physical Exam  General: The patient is alert and cooperative at the time of the examination.  The patient is markedly obese.  Skin: No significant peripheral edema is noted.   Neurologic Exam  Mental status: The patient is alert and oriented x 3 at the time of the examination. The patient has apparent normal recent and remote memory, with an apparently normal attention span and concentration ability.  Mini-Mental status examination done today shows a total score 30/30.   Cranial nerves: Facial symmetry is present. Speech is normal, no aphasia or dysarthria is noted. Extraocular movements are full. Visual fields are full.  Motor: The patient has good strength in all 4  extremities.  Sensory examination: Soft touch sensation is symmetric on the face, arms, and legs.  Coordination: The patient has good finger-nose-finger and heel-to-shin bilaterally.  Gait and station: The patient was not ambulated, he is wheelchair-bound.  Reflexes: Deep tendon reflexes are symmetric.   Assessment/Plan:  1.  Mild cognitive impairment  2.  Thoracic myelopathy, gait disorder  3.  Chronic neck and low back pain  The patient actually has fairly good strength in  both legs but his main issue is lack of sensation in the lower extremities impairing his ability to walk.  The patient will continue on Aricept and Namenda, he gets all of his medications through the [facility] hospital.  We will follow the memory issues over time, but he remains relatively stable from his last visit 2 years ago.  He will follow-up in 1 year.  Jill Alexanders MD 10/17/2019 12:03 PM  [facility] [address] [place], [place] [address]  Phone [phone] Fax [phone]

## 2019-12-31 ENCOUNTER — Other Ambulatory Visit: Payer: Self-pay

## 2019-12-31 DIAGNOSIS — C61 Malignant neoplasm of prostate: Secondary | ICD-10-CM

## 2020-01-04 DIAGNOSIS — Z23 Encounter for immunization: Secondary | ICD-10-CM | POA: Diagnosis not present

## 2020-02-01 DIAGNOSIS — Z23 Encounter for immunization: Secondary | ICD-10-CM | POA: Diagnosis not present

## 2020-02-06 ENCOUNTER — Telehealth: Payer: Self-pay

## 2020-02-06 ENCOUNTER — Ambulatory Visit
Admission: EM | Admit: 2020-02-06 | Discharge: 2020-02-06 | Disposition: A | Discharge status: Home / Self Care | Payer: Federal, State, Local not specified - PPO

## 2020-02-06 DIAGNOSIS — M4644 Discitis, unspecified, thoracic region: Secondary | ICD-10-CM | POA: Diagnosis not present

## 2020-02-06 MED ORDER — CEPHALEXIN 250 MG PO CAPS: 250.0000 mg | ORAL_CAPSULE | Freq: Four times a day (QID) | ORAL | 0 refills | Status: DC

## 2020-02-06 NOTE — Telephone Encounter (Signed)
Routed message from Michel Bickers, MD   I called and spoke with Cody Velez. He says that he has not felt up to par for several weeks. Several weeks ago he began to notice some tenderness around his spinal nerve stimulator after writing in his car to the [place] in [place]. He has also developed a dull ache between his shoulder blades. He has not had any fever, chills or sweats. He completed antibiotic therapy for vertebral infection in September of last year.   His daughter was with him during the call and looked at the nerve stimulator. She said that it definitely looked more red and swollen than normal. I suggested that he call his pain clinic and see if he can arrange a visit as soon as possible. He tells me that Dr. Cleon Gustin of the [facility] in [place] place the stimulator in January 2019.

## 2020-02-06 NOTE — Discharge Instructions (Signed)
CBC, CMP, ESR and c-reactive protein were completed at this visit Advised patient to follow-up with PCP and neurology Keflex was prescribed Return for worsening of symptoms

## 2020-02-06 NOTE — ED Provider Notes (Addendum)
[facility]    CSN: [id number] Arrival date & time: 02/06/20  1654      History   Chief Complaint No chief complaint on file.   HPI Cody Velez is a 74 y.o. male.   Who presented to the urgent care with a complaint of mid left thoracic back pain.  Denies any precipitating event.  Patient states he has inflammation and swelling around his back spinal stimulator at T10-T11.  Reported loss of sensory in his lower extremities.  Report he underwent incision and drainage by Dr. Ellene Route in the past due to the same issue.  Has completed 3 months course of Keflex and rifampin with relief.  Currently denied chills, fever, nausea, vomiting, diplopia, blurred vision, confusion.  Was sent by by Dr. Megan Salon to have labs completed.  The history is provided by the patient. No language interpreter was used.    Past Medical History:  Diagnosis Date  . Allergic rhinitis   . Arthritis   . B12 deficiency   . Cervicogenic headache 01/28/2016  . Childhood asthma   . Chronic back pain   . Coronary atherosclerosis of native coronary artery    Mild nonobstructive CAD at catheterization January 2015  . Depression   . Essential hypertension   . Falls   . GERD (gastroesophageal reflux disease)   . History of cerebrovascular disease 07/23/2015  . History of pneumonia 02/2011  . Hyperlipidemia   . Kidney stone   . Memory difficulty 07/23/2015  . OSA (obstructive sleep apnea)    CPAP - Dr. [name]  . Prostate cancer ([place])   . PTSD (post-traumatic stress disorder)    [place]  . Rectal bleeding     Patient Active Problem List   Diagnosis Date Noted  . Pseudogout of knee   . Pyogenic arthritis of right knee joint ([place])   . Falls frequently 05/08/2018  . Fall   . Sleep disturbance   . Post-op pain   . Hypokalemia   . Morbid obesity ([place])   . Postoperative pain   . DVT, lower extremity, distal, acute, bilateral ([place])   . Acute blood loss anemia   . Anemia of chronic disease   .  Essential hypertension   . Bacteremia   . Myelopathy ([place]) 03/30/2018  . Paraplegia ([place])   . Postlaminectomy syndrome, lumbar region   . Epidural abscess   . Abdominal distension   . Encephalopathy   . Weakness of both lower extremities   . Discitis of thoracic region   . Spinal cord stimulator status   . Staphylococcus aureus sepsis ([place]) 03/20/2018  . Bacteremia due to methicillin susceptible Staphylococcus aureus (MSSA) 03/18/2018  . Obesity 03/18/2018  . Nephrolithiasis 03/16/2018  . AKI (acute kidney injury) ([place]) 03/16/2018  . Cognitive impairment 03/16/2018  . Chronic pain syndrome 03/16/2018  . Cervicogenic headache 01/28/2016  . B12 deficiency 01/28/2016  . Memory difficulty 07/23/2015  . History of cerebrovascular disease 07/23/2015  . FH: colon cancer 08/13/2014  . Hx of adenomatous colonic polyps 08/13/2014  . Chronic back pain 05/30/2012  . CAD, NATIVE VESSEL 07/17/2010  . HYPERCHOLESTEROLEMIA 10/31/2009  . SMOKELESS TOBACCO ABUSE 10/31/2009  . Obstructive sleep apnea 10/31/2009  . Essential hypertension, benign 10/31/2009  . GERD 10/31/2009    Past Surgical History:  Procedure Laterality Date  . APPLICATION OF ROBOTIC ASSISTANCE FOR SPINAL PROCEDURE N/A 03/28/2018   Procedure: APPLICATION OF ROBOTIC ASSISTANCE FOR SPINAL PROCEDURE;  Surgeon: Kristeen Miss, MD;  Location: [facility];  Service:  Neurosurgery;  Laterality: N/A;  . BACK SURGERY  02/14/12   lumbar OR #7; "today redid L1L2; replaced screws; added bone from hip"  . BILATERAL KNEE ARTHROSCOPY    . COLONOSCOPY  10/15/2008   Dr. [name]: tubular adenoma   . COLONOSCOPY  12/17/2003   [facility]:EXMDYJ rectal and colon  . COLONOSCOPY N/A 09/05/2014   Procedure: COLONOSCOPY;  Surgeon: Daneil Dolin, MD;  [facility];  Service: Endoscopy;  Laterality: N/A;  7:30-rescheduled 9/17 to [place] notified pt  . CYSTOSCOPY WITH STENT PLACEMENT Right 01/27/2016   Procedure: CYSTOSCOPY WITH STENT PLACEMENT;   Surgeon: [name], MD;  Location: [facility];  Service: Urology;  Laterality: Right;  . CYSTOSCOPY/RETROGRADE/URETEROSCOPY/STONE EXTRACTION WITH BASKET Right 01/27/2016   Procedure: CYSTOSCOPY, RIGHT RETROGRADE, RIGHT URETEROSCOPY, STONE EXTRACTION ;  Surgeon: [name], MD;  Location: [facility];  Service: Urology;  Laterality: Right;  . ESOPHAGOGASTRODUODENOSCOPY  10/15/2008     Dr Rourk:Schatzki's ring status post dilation and disruption via 36 F Maloney dilator/ otherwise unremarkable esophagus, small hiatal hernia, multiple fundal gland polyps not manipulated, gastritis, negative H.pylori  . ESOPHAGOGASTRODUODENOSCOPY  06/21/02   [facility]:HVFMB sliding hiatal hernia with mild changes of reflux esophagitis limited to gastroesophageal junction.  Noncritical ring at distal esophagus, 3 cm proximal to gastroesophageal junction/Antral gastritis  . [place]   "broke face playing softball"  . FRACTURE SURGERY     "left wrist; broke it; took spur off"  . HOLMIUM LASER APPLICATION Right 02/20/369   Procedure: HOLMIUM LASER APPLICATION;  Surgeon: [name], MD;  Location: [facility];  Service: Urology;  Laterality: Right;  . KNEE ARTHROSCOPY Right 05/18/2018   Procedure: PARTIAL MEDIAL MENISECTOMY AND SURGICAL LAVAGE AND CHONDROPLASTY;  Surgeon: [name], MD;  Location: [facility];  Service: Orthopedics;  Laterality: Right;  . LEFT HEART CATHETERIZATION WITH CORONARY ANGIOGRAM N/A 12/27/2013   Procedure: LEFT HEART CATHETERIZATION WITH CORONARY ANGIOGRAM;  Surgeon: [name], MD;  Location: [facility] C[facility];  Service: Cardiovascular;  Laterality: N/A;  . neck epidural    . POSTERIOR LUMBAR FUSION 4 LEVEL N/A 03/28/2018   Procedure: Thoracic eight -Lumbar two- FIXATION WITH SCREW PLACEMENT, DECOMPRESSION Thoracic ten-Thoracic eleven  FOR OSTEOMYELITIS;  Surgeon: Kristeen Miss, MD;  Location: [facility];  Service: Neurosurgery;  Laterality: N/A;  . SHOULDER SURGERY Bilateral   . TEE  WITHOUT CARDIOVERSION N/A 03/21/2018   Procedure: TRANSESOPHAGEAL ECHOCARDIOGRAM (TEE) WITH PROPOFOL;  Surgeon: [name], MD;  [facility];  Service: Cardiovascular;  Laterality: N/A;       Home Medications    Prior to Admission medications   Medication Sig Start Date End Date Taking? Authorizing Provider  alfuzosin (UROXATRAL) 10 MG 24 hr tablet Take 10 mg by mouth daily with breakfast.    [provider]  amLODipine (NORVASC) 10 MG tablet Take 1 tablet (10 mg total) by mouth daily. 05/22/18   Bonnielee Haff, MD  aspirin 81 MG tablet Take 1 tablet (81 mg total) by mouth daily. 05/22/18   Bonnielee Haff, MD  baclofen (LIORESAL) 10 MG tablet TAKE ONE TABLET IN THE MORNING AND MIDDAY, THEN TWO TABLETS AT NIGHT 06/12/18   Kathrynn Ducking, MD  cephALEXin (KEFLEX) 250 MG capsule Take 1 capsule (250 mg total) by mouth 4 (four) times daily. 02/06/20   Krist Rosenboom, Darrelyn Hillock, FNP  colchicine 0.6 MG tablet Take 1 tablet (0.6 mg total) by mouth daily. 05/23/18   Bonnielee Haff, MD  cycloSPORINE (RESTASIS)  0.05 % ophthalmic emulsion 1 DROP INTO BOTH EYES TWICE A DAY FOR DRY EYES 05/22/18   Bonnielee Haff, MD  docusate sodium (COLACE) 100 MG capsule Take 2 capsules (200 mg total) by mouth 2 (two) times daily. 05/22/18   Bonnielee Haff, MD  donepezil (ARICEPT) 10 MG tablet Take 1 tablet (10 mg total) by mouth at bedtime. Patient taking differently: Take 10 mg by mouth 2 (two) times daily.  05/22/18   Bonnielee Haff, MD  finasteride (PROSCAR) 5 MG tablet Take 1 tablet (5 mg total) by mouth daily. Reported on 05/04/2016 05/22/18   Bonnielee Haff, MD  fluticasone [facility]) 50 MCG/ACT nasal spray Place 1 spray into both nostrils daily as needed for allergies. 05/22/18   Bonnielee Haff, MD  furosemide (LASIX) 20 MG tablet Take 20 mg by mouth daily. 01/31/20   [provider]  gabapentin (NEURONTIN) 400 MG capsule Take 1 capsule (400 mg total) by mouth 3 (three) times daily. Patient  taking differently: Take 400 mg by mouth 2 (two) times daily.  05/22/18   Bonnielee Haff, MD  hydrochlorothiazide (HYDRODIURIL) 25 MG tablet Take 25 mg by mouth daily.  04/21/19   [provider]  latanoprost (XALATAN) 0.005 % ophthalmic solution Place 1 drop into both eyes at bedtime. 05/22/18   Bonnielee Haff, MD  losartan (COZAAR) 100 MG tablet Take 100 mg by mouth daily.    [provider]  Melatonin 3 MG TABS Take 0.5 tablets (1.5 mg total) by mouth at bedtime. 05/22/18   Bonnielee Haff, MD  memantine (NAMENDA) 10 MG tablet Take 1 tablet (10 mg total) by mouth 2 (two) times daily. 05/22/18   Bonnielee Haff, MD  omeprazole (PRILOSEC) 20 MG capsule Take 2 capsules (40 mg total) by mouth daily. 05/22/18   Bonnielee Haff, MD  Oxycodone HCl 10 MG TABS Take 1 tablet (10 mg total) by mouth every 4 (four) hours as needed (moderate to severe pain). 05/22/18   Bonnielee Haff, MD  polyethylene glycol [facility] / Floria Raveling) packet Take 17 g by mouth daily. 05/23/18   Bonnielee Haff, MD  potassium chloride SA (K-DUR,KLOR-CON) 20 MEQ tablet Take 1 tablet (20 mEq total) by mouth daily. 05/22/18   Bonnielee Haff, MD  pravastatin (PRAVACHOL) 10 MG tablet Take 1 tablet (10 mg total) by mouth every evening. 05/22/18   Bonnielee Haff, MD  traZODone (DESYREL) 100 MG tablet Take 1 tablet (100 mg total) by mouth at bedtime. 05/22/18   Bonnielee Haff, MD  venlafaxine XR (EFFEXOR-XR) 150 MG 24 hr capsule Take 1 capsule (150 mg total) by mouth 2 (two) times daily. 05/22/18   Bonnielee Haff, MD    Family History Family History  Problem Relation Age of Onset  . Emphysema Father   . Heart failure Father   . Lung cancer Father   . CAD Father   . Colon cancer Mother   . Stroke Mother   . Breast cancer Mother   . Stroke Sister   . Heart attack Brother   . Dementia Paternal Uncle   . Emphysema Maternal Grandmother   . Stroke Maternal Grandmother   . Asthma Other        grandson  . Heart disease Paternal  Grandfather   . Anesthesia problems Neg Hx   . Hypotension Neg Hx   . Malignant hyperthermia Neg Hx   . Pseudochol deficiency Neg Hx     Social History Social History   Tobacco Use  . Smoking status: Former Smoker  Types: Cigarettes    Quit date: 12/20/1958    Years since quitting: 61.1  . Smokeless tobacco: Current User    Types: Chew  . Tobacco comment: Quit chewing 04/2015  Substance Use Topics  . Alcohol use: Yes    Alcohol/week: 0.0 standard drinks    Comment: couple bottles of wine per month  . Drug use: No    Types: Marijuana    Comment: "last used marijuana ~ 1969"     Allergies   Patient has no known allergies.   Review of Systems Review of Systems  Constitutional: Negative.   Respiratory: Negative.   Cardiovascular: Negative.   Musculoskeletal: Positive for back pain.  Neurological:       Sensory deficit     Physical Exam Triage Vital Signs ED Triage Vitals  Enc Vitals Group     BP 02/06/20 1712 (!) 145/87     Pulse Rate 02/06/20 1712 79     Resp 02/06/20 1712 18     Temp 02/06/20 1712 98.2 F (36.8 C)     Temp src --      SpO2 02/06/20 1712 96 %     Weight --      Height --      Head Circumference --      Peak Flow --      Pain Score 02/06/20 1711 5     Pain Loc --      Pain Edu? --      Excl. in [place]? --    No data found.  Updated Vital Signs BP (!) 145/87   Pulse 79   Temp 98.2 F (36.8 C)   Resp 18   SpO2 96%   Visual Acuity Right Eye Distance:   Left Eye Distance:   Bilateral Distance:    Right Eye Near:   Left Eye Near:    Bilateral Near:     Physical Exam Constitutional:      General: He is not in acute distress.    Appearance: Normal appearance. He is normal weight. He is not toxic-appearing or diaphoretic.  Cardiovascular:     Rate and Rhythm: Normal rate and regular rhythm.     Pulses: Normal pulses.     Heart sounds: Normal heart sounds. No murmur. No gallop.   Pulmonary:     Effort: Pulmonary effort is  normal. No respiratory distress.     Breath sounds: Normal breath sounds. No stridor. No wheezing, rhonchi or rales.  Chest:     Chest wall: No tenderness.  Neurological:     Mental Status: He is alert and oriented to person, place, and time. Mental status is at baseline.     Sensory: Sensory deficit present.     Motor: Weakness present.     Coordination: Coordination is intact.     Comments: Focal weakness present      UC Treatments / Results  Labs (all labs ordered are listed, but only abnormal results are displayed) Labs Reviewed  CBC WITH DIFFERENTIAL/PLATELET  COMPREHENSIVE METABOLIC PANEL  SEDIMENTATION RATE  C-REACTIVE PROTEIN    EKG   Radiology No results found.  Procedures Procedures (including critical care time)  Medications Ordered in UC Medications - No data to display  Initial Impression / Assessment and Plan / UC Course  I have reviewed the triage vital signs and the nursing notes.  Pertinent labs & imaging results that were available during my care of the patient were reviewed by me and considered in my medical  decision making (see chart for details).   Due to patient history of discitis of thoracic region, CBC, CMP, ESR and C reactive protein were completed. Keflex was prescribed Continue to use oxycodone as prescribed for pain Advised patient to keep and continue to follow up with PCP and neurology for further evaluation To return for worsening of symptoms   Final Clinical Impressions(s) / UC Diagnoses   Final diagnoses:  Discitis of thoracic region     Discharge Instructions     CBC, CMP, ESR and c-reactive protein were completed at this visit Advised patient to follow-up with PCP and neurology Keflex was prescribed Return for worsening of symptoms    ED Prescriptions    Medication Sig Dispense Auth. Provider   cephALEXin (KEFLEX) 250 MG capsule Take 1 capsule (250 mg total) by mouth 4 (four) times daily. 28 capsule Charman Blasco, Darrelyn Hillock, FNP     PDMP not reviewed this encounter.   Emerson Monte, FNP 02/06/20 1836    Emerson Monte, FNP 02/06/20 1840

## 2020-02-06 NOTE — ED Triage Notes (Signed)
Pt presents with lower back pain. Pt states he had a spinal stimulator put in in 2019 19 and has been infected before , now having some swelling and pain in area for a couple weeks .

## 2020-02-06 NOTE — Telephone Encounter (Signed)
Patient called the office regarding MyChart message. Patient states his PCP is closed today. LPN advised patient to seek care in ED or Urgent care for pain related issues.  Patient agrees to keep [facility] updated via MyChart if he  Needs to scheduled with Dr. Megan Salon or not.  Eugenia Mcalpine

## 2020-02-07 LAB — CBC WITH DIFFERENTIAL/PLATELET
Basophils Absolute: 0.1 10*3/uL (ref 0.0–0.2)
Basos: 1 %
EOS (ABSOLUTE): 0.5 10*3/uL — ABNORMAL HIGH (ref 0.0–0.4)
Eos: 7 %
Hematocrit: 43.5 % (ref 37.5–51.0)
Hemoglobin: 14.1 g/dL (ref 13.0–17.7)
Immature Grans (Abs): 0 10*3/uL (ref 0.0–0.1)
Immature Granulocytes: 0 %
Lymphocytes Absolute: 1.6 10*3/uL (ref 0.7–3.1)
Lymphs: 22 %
MCH: 28 pg (ref 26.6–33.0)
MCHC: 32.4 g/dL (ref 31.5–35.7)
MCV: 87 fL (ref 79–97)
Monocytes Absolute: 0.6 10*3/uL (ref 0.1–0.9)
Monocytes: 8 %
Neutrophils Absolute: 4.6 10*3/uL (ref 1.4–7.0)
Neutrophils: 62 %
Platelets: 374 10*3/uL (ref 150–450)
RBC: 5.03 x10E6/uL (ref 4.14–5.80)
RDW: 14.4 % (ref 11.6–15.4)
WBC: 7.4 10*3/uL (ref 3.4–10.8)

## 2020-02-07 LAB — COMPREHENSIVE METABOLIC PANEL
ALT: 29 IU/L (ref 0–44)
AST: 30 IU/L (ref 0–40)
Albumin/Globulin Ratio: 1.2 (ref 1.2–2.2)
Albumin: 3.8 g/dL (ref 3.7–4.7)
Alkaline Phosphatase: 111 IU/L (ref 39–117)
BUN/Creatinine Ratio: 6 — ABNORMAL LOW (ref 10–24)
BUN: 6 mg/dL — ABNORMAL LOW (ref 8–27)
Bilirubin Total: <0.2 mg/dL (ref 0.0–1.2)
CO2: 20 mmol/L (ref 20–29)
Calcium: 9.1 mg/dL (ref 8.6–10.2)
Chloride: 102 mmol/L (ref 96–106)
Creatinine, Ser: 1.02 mg/dL (ref 0.76–1.27)
GFR calc Af Amer: 84 mL/min/{1.73_m2} (ref >59)
GFR calc non Af Amer: 73 mL/min/{1.73_m2} (ref >59)
Globulin, Total: 3.3 g/dL (ref 1.5–4.5)
Glucose: 121 mg/dL — ABNORMAL HIGH (ref 65–99)
Potassium: 5.2 mmol/L (ref 3.5–5.2)
Sodium: 139 mmol/L (ref 134–144)
Total Protein: 7.1 g/dL (ref 6.0–8.5)

## 2020-02-07 LAB — SEDIMENTATION RATE: Sed Rate: 50 mm/hr — ABNORMAL HIGH (ref 0–30)

## 2020-02-07 LAB — C-REACTIVE PROTEIN: CRP: 10 mg/L (ref 0–10)

## 2020-02-08 DIAGNOSIS — M5412 Radiculopathy, cervical region: Secondary | ICD-10-CM | POA: Diagnosis not present

## 2020-02-08 DIAGNOSIS — M542 Cervicalgia: Secondary | ICD-10-CM | POA: Diagnosis not present

## 2020-02-08 DIAGNOSIS — Z9689 Presence of other specified functional implants: Secondary | ICD-10-CM | POA: Diagnosis not present

## 2020-02-11 NOTE — Progress Notes (Signed)
Virtual Visit via Telephone Note   This visit type was conducted due to national recommendations for restrictions regarding the COVID-19 Pandemic (e.g. social distancing) in an effort to limit this patient's exposure and mitigate transmission in our community.  Due to his co-morbid illnesses, this patient is at least at moderate risk for complications without adequate follow up.  This format is felt to be most appropriate for this patient at this time.  The patient did not have access to video technology/had technical difficulties with video requiring transitioning to audio format only (telephone).  All issues noted in this document were discussed and addressed.  No physical exam could be performed with this format.  Please refer to the patient's chart for his  consent to telehealth for [facility].   Date:  02/12/2020   ID:  Cody Velez, DOB [birth year], MRN [id number]  Patient Location: Home Provider Location: Office  PCP:  Asencion Noble, MD  Cardiologist:  Rozann Lesches, MD  Electrophysiologist:  None   Evaluation Performed:  Follow-Up Visit  Chief Complaint:  Cardiac follow-up  History of Present Illness:    Cody Velez is a 74 y.o. male last assessed via telehealth encounter in August 2020.  We spoke by phone today.  He does not report any obvious angina symptoms on medical therapy.  He continues to use a wheelchair at home.  I reviewed his recent lab work as outlined below.  He is also following through the [facility] system regularly.  I went over his medications, current cardiac regimen includes aspirin, HCTZ, Norvasc, Cozaar, potassium supplements, and Pravachol.  He states that he is starting a keto diet, trying to lose weight.  The patient does not have symptoms concerning for COVID-19 infection (fever, chills, cough, or new shortness of breath).  He states that he has had the vaccine.   Past Medical History:  Diagnosis Date  . Allergic rhinitis   . Arthritis    . B12 deficiency   . Cervicogenic headache 01/28/2016  . Childhood asthma   . Chronic back pain   . Coronary atherosclerosis of native coronary artery    Mild nonobstructive CAD at catheterization January 2015  . Depression   . Essential hypertension   . Falls   . GERD (gastroesophageal reflux disease)   . History of cerebrovascular disease 07/23/2015  . History of pneumonia 02/2011  . Hyperlipidemia   . Kidney stone   . Memory difficulty 07/23/2015  . OSA (obstructive sleep apnea)    CPAP - Dr. [name]  . Prostate cancer ([place])   . PTSD (post-traumatic stress disorder)    [place]  . Rectal bleeding    Past Surgical History:  Procedure Laterality Date  . APPLICATION OF ROBOTIC ASSISTANCE FOR SPINAL PROCEDURE N/A 03/28/2018   Procedure: APPLICATION OF ROBOTIC ASSISTANCE FOR SPINAL PROCEDURE;  Surgeon: Kristeen Miss, MD;  Location: [place];  Service: Neurosurgery;  Laterality: N/A;  . BACK SURGERY  02/14/12   lumbar OR #7; "today redid L1L2; replaced screws; added bone from hip"  . BILATERAL KNEE ARTHROSCOPY    . COLONOSCOPY  10/15/2008   Dr. [name]: tubular adenoma   . COLONOSCOPY  12/17/2003   [id number] rectal and colon  . COLONOSCOPY N/A 09/05/2014   Procedure: COLONOSCOPY;  Surgeon: Daneil Dolin, MD;  [facility];  Service: Endoscopy;  Laterality: N/A;  7:30-rescheduled 9/17 to [place] notified pt  . CYSTOSCOPY WITH STENT PLACEMENT Right 01/27/2016   Procedure: CYSTOSCOPY WITH STENT PLACEMENT;  Surgeon: [name], MD;  Location: [facility];  Service: Urology;  Laterality: Right;  . CYSTOSCOPY/RETROGRADE/URETEROSCOPY/STONE EXTRACTION WITH BASKET Right 01/27/2016   Procedure: CYSTOSCOPY, RIGHT RETROGRADE, RIGHT URETEROSCOPY, STONE EXTRACTION ;  Surgeon: [name], MD;  Location: [facility];  Service: Urology;  Laterality: Right;  . ESOPHAGOGASTRODUODENOSCOPY  10/15/2008     Dr Rourk:Schatzki's ring status post dilation and disruption via 14 F Maloney dilator/  otherwise unremarkable esophagus, small hiatal hernia, multiple fundal gland polyps not manipulated, gastritis, negative H.pylori  . ESOPHAGOGASTRODUODENOSCOPY  06/21/02   [id number] sliding hiatal hernia with mild changes of reflux esophagitis limited to gastroesophageal junction.  Noncritical ring at distal esophagus, 3 cm proximal to gastroesophageal junction/Antral gastritis  . [place]   "broke face playing softball"  . FRACTURE SURGERY     "left wrist; broke it; took spur off"  . HOLMIUM LASER APPLICATION Right [id number]   Procedure: HOLMIUM LASER APPLICATION;  Surgeon: [name], MD;  Location: [facility];  Service: Urology;  Laterality: Right;  . KNEE ARTHROSCOPY Right 05/18/2018   Procedure: PARTIAL MEDIAL MENISECTOMY AND SURGICAL LAVAGE AND CHONDROPLASTY;  Surgeon: [name], MD;  Location: [place];  Service: Orthopedics;  Laterality: Right;  . LEFT HEART CATHETERIZATION WITH CORONARY ANGIOGRAM N/A 12/27/2013   Procedure: LEFT HEART CATHETERIZATION WITH CORONARY ANGIOGRAM;  Surgeon: [name], MD;  Location: [facility] CATH LAB;  Service: Cardiovascular;  Laterality: N/A;  . neck epidural    . POSTERIOR LUMBAR FUSION 4 LEVEL N/A 03/28/2018   Procedure: Thoracic eight -Lumbar two- FIXATION WITH SCREW PLACEMENT, DECOMPRESSION Thoracic ten-Thoracic eleven  FOR OSTEOMYELITIS;  Surgeon: Kristeen Miss, MD;  Location: [place];  Service: Neurosurgery;  Laterality: N/A;  . SHOULDER SURGERY Bilateral   . TEE WITHOUT CARDIOVERSION N/A 03/21/2018   Procedure: TRANSESOPHAGEAL ECHOCARDIOGRAM (TEE) WITH PROPOFOL;  Surgeon: [name], MD;  [facility];  Service: Cardiovascular;  Laterality: N/A;     Current Meds  Medication Sig  . alfuzosin (UROXATRAL) 10 MG 24 hr tablet Take 10 mg by mouth daily with breakfast.  . amLODipine (NORVASC) 10 MG tablet Take 1 tablet (10 mg total) by mouth daily.  Marland Kitchen aspirin 81 MG tablet Take 1 tablet (81 mg total) by mouth daily.   . baclofen (LIORESAL) 10 MG tablet TAKE ONE TABLET IN THE MORNING AND MIDDAY, THEN TWO TABLETS AT NIGHT  . cephALEXin (KEFLEX) 250 MG capsule Take 1 capsule (250 mg total) by mouth 4 (four) times daily.  . cycloSPORINE (RESTASIS) 0.05 % ophthalmic emulsion 1 DROP INTO BOTH EYES TWICE A DAY FOR DRY EYES  . docusate sodium (COLACE) 100 MG capsule Take 2 capsules (200 mg total) by mouth 2 (two) times daily.  Marland Kitchen donepezil (ARICEPT) 10 MG tablet Take 1 tablet (10 mg total) by mouth at bedtime.  . finasteride (PROSCAR) 5 MG tablet Take 1 tablet (5 mg total) by mouth daily. Reported on 05/04/2016  . fluticasone (FLONASE) 50 MCG/ACT nasal spray Place 1 spray into both nostrils daily as needed for allergies.  Marland Kitchen gabapentin (NEURONTIN) 400 MG capsule Take 1 capsule (400 mg total) by mouth 3 (three) times daily. (Patient taking differently: Take 400 mg by mouth 2 (two) times daily. )  . latanoprost (XALATAN) 0.005 % ophthalmic solution Place 1 drop into both eyes at bedtime.  Marland Kitchen losartan (COZAAR) 100 MG tablet Take 100 mg by mouth daily.  . Melatonin 3 MG TABS Take 0.5 tablets (1.5 mg total) by mouth at bedtime.  Marland Kitchen  memantine (NAMENDA) 10 MG tablet Take 1 tablet (10 mg total) by mouth 2 (two) times daily.  Marland Kitchen omeprazole (PRILOSEC) 20 MG capsule Take 2 capsules (40 mg total) by mouth daily.  . Oxycodone HCl 10 MG TABS Take 1 tablet (10 mg total) by mouth every 4 (four) hours as needed (moderate to severe pain).  . polyethylene glycol (MIRALAX / GLYCOLAX) packet Take 17 g by mouth daily.  . potassium chloride SA (K-DUR,KLOR-CON) 20 MEQ tablet Take 1 tablet (20 mEq total) by mouth daily.  . pravastatin (PRAVACHOL) 10 MG tablet Take 1 tablet (10 mg total) by mouth every evening.  . traZODone (DESYREL) 100 MG tablet Take 1 tablet (100 mg total) by mouth at bedtime.  Marland Kitchen venlafaxine XR (EFFEXOR-XR) 150 MG 24 hr capsule Take 1 capsule (150 mg total) by mouth 2 (two) times daily. (Patient taking differently: Take 150 mg by  mouth daily with breakfast. )     Allergies:   Patient has no known allergies.   ROS:   Memory loss.  Prior CV studies:   The following studies were reviewed today:  TEE 03/21/2018: Study Conclusions  - Procedure narrative: Transesophageal echocardiography. Topical anesthesia was obtained using viscous lidocaine. A transesophageal probe was inserted by the attending cardiologist. Image quality was poor. This was a technically difficult study. - Left ventricle: Systolic function was normal. The estimated ejection fraction was in the range of 55% to 60%. - Descending aorta: The descending aorta had moderate diffuse disease. - Mitral valve: There was trivial regurgitation. - Left atrium: No evidence of thrombus in the atrial cavity or appendage. - Tricuspid valve: There was trivial regurgitation. - Pulmonic valve: There was trivial regurgitation.  Impressions:  - Images were overall limited. Valves best visualized with nonstandard views. There were no obvious valvular vegetations.  Labs/Other Tests and Data Reviewed:     EKG:  An ECG dated 02/08/2019 was personally reviewed today and demonstrated:  Sinus rhythm with PACs and IVCD.  Recent Labs: 02/06/2020: ALT 29; BUN 6; Creatinine, Ser 1.02; Hemoglobin 14.1; Platelets 374; Potassium 5.2; Sodium 139    Wt Readings from Last 3 Encounters:  02/12/20 (!) 319 lb (144.7 kg)  10/17/19 (!) 310 lb (140.6 kg)  08/08/19 (!) 315 lb (142.9 kg)     Objective:    Vital Signs:  BP 132/83   Ht 6\' 3"  (1.905 m)   Wt (!) 319 lb (144.7 kg)   BMI 39.87 kg/m    Patient spoke in full sentences, not short of breath. No audible wheezing or coughing. Speech pattern normal.  ASSESSMENT & PLAN:    1.  History of nonobstructive CAD by previous work-up with plan to continue medical therapy in the absence of progressive angina symptoms.  Plan to continue aspirin, Norvasc, Cozaar, and Pravachol.  2.  Mixed hyperlipidemia.  He  is on Pravachol and follows with the [facility] system.  Time:   Today, I have spent 5 minutes with the patient with telehealth technology discussing the above problems.     Medication Adjustments/Labs and Tests Ordered: Current medicines are reviewed at length with the patient today.  Concerns regarding medicines are outlined above.   Tests Ordered: No orders of the defined types were placed in this encounter.   Medication Changes: No orders of the defined types were placed in this encounter.   Follow Up:  In Person 6 months in the [place] office.  Signed, Rozann Lesches, MD  02/12/2020 11:30 AM    [facility]

## 2020-02-12 ENCOUNTER — Encounter: Payer: Self-pay | Admitting: Cardiology

## 2020-02-12 ENCOUNTER — Telehealth (INDEPENDENT_AMBULATORY_CARE_PROVIDER_SITE_OTHER): Payer: Medicare Other | Admitting: Cardiology

## 2020-02-12 VITALS — BP 132/83 | Ht 75.0 in | Wt 319.0 lb

## 2020-02-12 DIAGNOSIS — E782 Mixed hyperlipidemia: Secondary | ICD-10-CM | POA: Diagnosis not present

## 2020-02-12 DIAGNOSIS — I2510 Atherosclerotic heart disease of native coronary artery without angina pectoris: Secondary | ICD-10-CM | POA: Diagnosis not present

## 2020-02-12 NOTE — Patient Instructions (Addendum)
Medication Instructions:    Your physician recommends that you continue on your current medications as directed. Please refer to the Current Medication list given to you today.  Labwork:  NONE  Testing/Procedures:  NONE  Follow-Up:  Your physician recommends that you schedule a follow-up appointment in: 6 months (office). You will receive a reminder letter in the mail in about 4 months reminding you to call and schedule your appointment. If you don't receive this letter, please contact our office.  Any Other Special Instructions Will Be Listed Below (If Applicable).  If you need a refill on your cardiac medications before your next appointment, please call your pharmacy. 

## 2020-02-14 DIAGNOSIS — M546 Pain in thoracic spine: Secondary | ICD-10-CM | POA: Diagnosis not present

## 2020-02-14 DIAGNOSIS — G8929 Other chronic pain: Secondary | ICD-10-CM | POA: Diagnosis not present

## 2020-02-15 ENCOUNTER — Telehealth: Payer: Self-pay

## 2020-02-15 NOTE — Telephone Encounter (Signed)
Received call from [facility] from Dr. Mallie Mussel Elsnor's office about patient. Informed her that Dr. Megan Salon is out of the office until Monday. Caren Griffins did state that its not urgent but wanted to provide cell number for Dr. Megan Salon to use when he's back in the office.  Cell number is [phone].   Thanks Carlean Purl, RN

## 2020-02-19 ENCOUNTER — Telehealth: Payer: Self-pay

## 2020-02-19 NOTE — Telephone Encounter (Signed)
Per Dr. Megan Salon scheduled patient a virtual appointment for this Thursday at 10 am. Patient is okay with appointment; would like link sent through email. [place]

## 2020-02-20 ENCOUNTER — Other Ambulatory Visit: Payer: Self-pay | Admitting: Neurological Surgery

## 2020-02-21 ENCOUNTER — Telehealth (INDEPENDENT_AMBULATORY_CARE_PROVIDER_SITE_OTHER): Payer: Medicare Other | Admitting: Internal Medicine

## 2020-02-21 ENCOUNTER — Other Ambulatory Visit: Payer: Self-pay

## 2020-02-21 ENCOUNTER — Other Ambulatory Visit (HOSPITAL_COMMUNITY)
Admission: RE | Admit: 2020-02-21 | Discharge: 2020-02-21 | Disposition: A | Discharge status: Home / Self Care | Payer: Medicare Other | Source: Ambulatory Visit | Attending: Neurological Surgery | Admitting: Neurological Surgery

## 2020-02-21 ENCOUNTER — Encounter: Payer: Self-pay | Admitting: Internal Medicine

## 2020-02-21 DIAGNOSIS — B9561 Methicillin susceptible Staphylococcus aureus infection as the cause of diseases classified elsewhere: Secondary | ICD-10-CM | POA: Diagnosis not present

## 2020-02-21 DIAGNOSIS — Z01812 Encounter for preprocedural laboratory examination: Secondary | ICD-10-CM | POA: Insufficient documentation

## 2020-02-21 DIAGNOSIS — I2510 Atherosclerotic heart disease of native coronary artery without angina pectoris: Secondary | ICD-10-CM | POA: Diagnosis not present

## 2020-02-21 DIAGNOSIS — R7881 Bacteremia: Secondary | ICD-10-CM | POA: Diagnosis not present

## 2020-02-21 DIAGNOSIS — Z20822 Contact with and (suspected) exposure to covid-19: Secondary | ICD-10-CM | POA: Diagnosis not present

## 2020-02-21 NOTE — Progress Notes (Signed)
Virtual Visit via Video Note  I connected with@ on 02/21/20 at 10:00 AM EST by a video enabled telemedicine application and verified that I am speaking with the correct person using two identifiers.  Location: Patient: Home Provider: [facility]   I discussed the limitations of evaluation and management by telemedicine and the availability of in person appointments. The patient expressed understanding and agreed to proceed.  [facility] for Infectious Disease  Patient Active Problem List   Diagnosis Date Noted  . Pseudogout of knee     Priority: High  . Discitis of thoracic region     Priority: High  . Bacteremia due to methicillin susceptible Staphylococcus aureus (MSSA) 03/18/2018    Priority: High  . Pyogenic arthritis of right knee joint ([place])   . Falls frequently 05/08/2018  . Fall   . Sleep disturbance   . Post-op pain   . Hypokalemia   . Morbid obesity ([place])   . Postoperative pain   . DVT, lower extremity, distal, acute, bilateral ([place])   . Acute blood loss anemia   . Anemia of chronic disease   . Essential hypertension   . Bacteremia   . Myelopathy ([place]) 03/30/2018  . Paraplegia ([place])   . Postlaminectomy syndrome, lumbar region   . Epidural abscess   . Abdominal distension   . Encephalopathy   . Weakness of both lower extremities   . Spinal cord stimulator status   . Staphylococcus aureus sepsis ([place]) 03/20/2018  . Obesity 03/18/2018  . Nephrolithiasis 03/16/2018  . AKI (acute kidney injury) ([place]) 03/16/2018  . Cognitive impairment 03/16/2018  . Chronic pain syndrome 03/16/2018  . Cervicogenic headache 01/28/2016  . B12 deficiency 01/28/2016  . Memory difficulty 07/23/2015  . History of cerebrovascular disease 07/23/2015  . FH: colon cancer 08/13/2014  . Hx of adenomatous colonic polyps 08/13/2014  . Chronic back pain 05/30/2012  . CAD, NATIVE VESSEL 07/17/2010  . HYPERCHOLESTEROLEMIA 10/31/2009  . SMOKELESS TOBACCO ABUSE 10/31/2009  . Obstructive sleep  apnea 10/31/2009  . Essential hypertension, benign 10/31/2009  . GERD 10/31/2009    Patient's Medications  New Prescriptions   No medications on file  Previous Medications   ALFUZOSIN (UROXATRAL) 10 MG 24 HR TABLET    Take 10 mg by mouth daily with breakfast.   AMLODIPINE (NORVASC) 10 MG TABLET    Take 1 tablet (10 mg total) by mouth daily.   ASPIRIN 81 MG TABLET    Take 1 tablet (81 mg total) by mouth daily.   BACLOFEN (LIORESAL) 10 MG TABLET    TAKE ONE TABLET IN THE MORNING AND MIDDAY, THEN TWO TABLETS AT NIGHT   CEPHALEXIN (KEFLEX) 250 MG CAPSULE    Take 1 capsule (250 mg total) by mouth 4 (four) times daily.   CYCLOSPORINE (RESTASIS) 0.05 % OPHTHALMIC EMULSION    1 DROP INTO BOTH EYES TWICE A DAY FOR DRY EYES   DOCUSATE SODIUM (COLACE) 100 MG CAPSULE    Take 2 capsules (200 mg total) by mouth 2 (two) times daily.   DONEPEZIL (ARICEPT) 10 MG TABLET    Take 1 tablet (10 mg total) by mouth at bedtime.   FINASTERIDE (PROSCAR) 5 MG TABLET    Take 1 tablet (5 mg total) by mouth daily. Reported on 05/04/2016   FLUTICASONE (FLONASE) 50 MCG/ACT NASAL SPRAY    Place 1 spray into both nostrils daily as needed for allergies.   GABAPENTIN (NEURONTIN) 400 MG CAPSULE    Take 1 capsule (400 mg total) by mouth  3 (three) times daily.   LATANOPROST (XALATAN) 0.005 % OPHTHALMIC SOLUTION    Place 1 drop into both eyes at bedtime.   LOSARTAN (COZAAR) 100 MG TABLET    Take 100 mg by mouth daily.   MELATONIN 10 MG TABS    Take 10 mg by mouth at bedtime.   MEMANTINE (NAMENDA) 10 MG TABLET    Take 1 tablet (10 mg total) by mouth 2 (two) times daily.   OMEPRAZOLE (PRILOSEC) 20 MG CAPSULE    Take 2 capsules (40 mg total) by mouth daily.   OXYCODONE HCL 10 MG TABS    Take 1 tablet (10 mg total) by mouth every 4 (four) hours as needed (moderate to severe pain).   POLYETHYLENE GLYCOL (MIRALAX / GLYCOLAX) PACKET    Take 17 g by mouth daily.   POTASSIUM CHLORIDE SA (K-DUR,KLOR-CON) 20 MEQ TABLET    Take 1 tablet (20  mEq total) by mouth daily.   PRAVASTATIN (PRAVACHOL) 10 MG TABLET    Take 1 tablet (10 mg total) by mouth every evening.   TRAZODONE (DESYREL) 100 MG TABLET    Take 1 tablet (100 mg total) by mouth at bedtime.   VENLAFAXINE XR (EFFEXOR-XR) 150 MG 24 HR CAPSULE    Take 1 capsule (150 mg total) by mouth 2 (two) times daily.  Modified Medications   No medications on file  Discontinued Medications   No medications on file    History of Present Illness: I called and spoke with Cody Velez by phone this morning.  He completed 15 months of antibiotic therapy for MSSA bacteremia and thoracic spine infection last June.  He did well until several weeks ago when he began to notice some left-sided back pain with redness and swelling over his spinal nerve stimulator.  He saw his PCP, Dr. Asencion Noble who found that his sed rate was elevated.  He will tells me that Dr. Willey Blade also noted redness and swelling over the stimulator.  He was started on empiric cephalexin and referred to his pain clinic in [place].  The notes there indicate that there were no abnormalities over the stimulator but Cody Velez says that the doctor there did note some redness but thought it was due to feel scratching over the area.  He followed up with his neurosurgeon, Dr. Ellene Route last week.  His inflammatory markers were still elevated.  Dr. Ellene Route had him turn off the stimulator.  He has not noted any change in his pain.  In addition to his back pain and pain over the stimulator he is also had some new right upper quadrant abdominal pain.  Instead of constipation he is also noted more frequent bowel movements.  This started at the same time that his increased pain began several weeks ago.  He is not having diarrhea.  Review of Systems  Constitutional: Negative for chills, fever and weight loss.  Respiratory: Negative for cough and shortness of breath.   Cardiovascular: Negative for chest pain.  Gastrointestinal: Positive for abdominal pain and  constipation. Negative for diarrhea, nausea and vomiting.  Musculoskeletal: Negative for back pain.  Skin: Negative for rash.  Neurological: Positive for focal weakness.    Past Medical History:  Diagnosis Date  . Allergic rhinitis   . Arthritis   . B12 deficiency   . Cervicogenic headache 01/28/2016  . Childhood asthma   . Chronic back pain   . Coronary atherosclerosis of native coronary artery    Mild nonobstructive CAD at catheterization January 2015  .  Depression   . Essential hypertension   . Falls   . GERD (gastroesophageal reflux disease)   . History of cerebrovascular disease 07/23/2015  . History of pneumonia 02/2011  . Hyperlipidemia   . Kidney stone   . Memory difficulty 07/23/2015  . OSA (obstructive sleep apnea)    CPAP - Dr. [name]  . Prostate cancer ([place])   . PTSD (post-traumatic stress disorder)    [place]  . Rectal bleeding     Social History   Tobacco Use  . Smoking status: Former Smoker    Types: Cigarettes    Quit date: 12/20/1958    Years since quitting: 61.2  . Smokeless tobacco: Current User    Types: Chew  . Tobacco comment: Quit chewing 04/2015  Substance Use Topics  . Alcohol use: Yes    Alcohol/week: 0.0 standard drinks    Comment: couple bottles of wine per month  . Drug use: No    Types: Marijuana    Comment: "last used marijuana ~ 1969"    Family History  Problem Relation Age of Onset  . Emphysema Father   . Heart failure Father   . Lung cancer Father   . CAD Father   . Colon cancer Mother   . Stroke Mother   . Breast cancer Mother   . Stroke Sister   . Heart attack Brother   . Dementia Paternal Uncle   . Emphysema Maternal Grandmother   . Stroke Maternal Grandmother   . Asthma Other        grandson  . Heart disease Paternal Grandfather   . Anesthesia problems Neg Hx   . Hypotension Neg Hx   . Malignant hyperthermia Neg Hx   . Pseudochol deficiency Neg Hx     No Known Allergies  Health Maintenance  Topic Date Due  .  TETANUS/TDAP  08/24/1965  . PNA vac Low Risk Adult (1 of 2 - PCV13) 08/25/2011  . COLONOSCOPY  09/05/2024  . INFLUENZA VACCINE  Completed  . Hepatitis C Screening  Completed    Observations/Objective: Sed Rate  Date Value  02/06/2020 50 mm/hr (H)  01/18/2019 17 mm/h  08/30/2018 19 mm/h   CRP (mg/L)  Date Value  02/06/2020 10  01/18/2019 7.2  08/30/2018 2.7  07/18/2018 7.0    Assessment and Plan: I am concerned that she will may have developed a pocket infection of his nerve stimulator.  Rather than continue empiric therapy which would be very unlikely to ever cure pocket infection I asked him to stop his empiric cephalexin.  Dr. Ellene Route plans on removing the stimulator early next week.  I asked him to obtain specimens for stain and culture to guide future antibiotic therapy.  He has been off of antibiotic therapy for his discitis for 9 months.  I would be surprised if he has a relapse at this point.  Once his stimulator is out we can consider getting a spine MRI scans to investigate further if he continues to have increased pain.  I do not have an explanation for his new right upper quadrant pain.  I suggested that we see how he does after the stimulator is removed.  He will follow-up with me next week.  Follow Up Instructions: 1. Observe off of antibiotics 2. Remove nerve stimulator next week 3. Follow-up with me after that surgery   I discussed the assessment and treatment plan with the patient. The patient was provided an opportunity to ask questions and all were answered. The patient  agreed with the plan and demonstrated an understanding of the instructions.   The patient was advised to call back or seek an in-person evaluation if the symptoms worsen or if the condition fails to improve as anticipated.  I provided 18 minutes of non-face-to-face time during this encounter.  Michel Bickers, MD [facility] [phone]  pager   [phone] cell 02/21/2020, 9:14 AM

## 2020-02-22 ENCOUNTER — Other Ambulatory Visit: Payer: Self-pay

## 2020-02-22 ENCOUNTER — Encounter (HOSPITAL_COMMUNITY): Payer: Self-pay | Admitting: Neurological Surgery

## 2020-02-22 LAB — SARS CORONAVIRUS 2 (TAT 6-24 HRS): SARS Coronavirus 2: NEGATIVE

## 2020-02-22 NOTE — Progress Notes (Signed)
Patient denies shortness of breath, fever, cough and chest pain.  PCP - Dr Asencion Noble Cardiologist - Dr Johnny Bridge Inf Disease - Dr Michel Bickers Neurology - Dr Margette Fast Pain Medicine - Dr Cleon Gustin  Chest x-ray - denies EKG - DOS 02/25/20 Stress Test - 03/13/18 ECHO TEE - 03/21/18  Cardiac Cath - 12/27/13  Sleep Study - Yes  CPAP - uses CPAP nightly  Aspirin Instructions:Last dose on 02/20/20.  Anesthesia review: Yes  STOP now taking any Aspirin (unless otherwise instructed by your surgeon), Aleve, Naproxen, Ibuprofen, Motrin, Advil, Goody's, BC's, all herbal medications, fish oil, and all vitamins.   Coronavirus Screening Covid test on 02/22/20 was negative.  Patient verbalized understanding of instructions that were given via phone.

## 2020-02-22 NOTE — Anesthesia Preprocedure Evaluation (Addendum)
Anesthesia Evaluation  Patient identified by MRN, date of birth, ID band Patient awake    Reviewed: Allergy & Precautions, NPO status , Patient's Chart, lab work & pertinent test results  History of Anesthesia Complications Negative for: history of anesthetic complications  Airway Mallampati: III  TM Distance: >3 FB Neck ROM: Full    Dental  (+) Missing,    Pulmonary sleep apnea and Continuous Positive Airway Pressure Ventilation , former smoker,    Pulmonary exam normal        Cardiovascular hypertension, Pt. on medications + CAD  Normal cardiovascular exam  2019 TEE: normal EF, poor image quality   Neuro/Psych Anxiety Depression Dementia Wheelchair bound    GI/Hepatic Neg liver ROS, GERD  Medicated and Controlled,  Endo/Other  negative endocrine ROS  Renal/GU negative Renal ROS  negative genitourinary   Musculoskeletal Infection of spinal cord stimulator   Abdominal   Peds  Hematology negative hematology ROS (+)   Anesthesia Other Findings Day of surgery medications reviewed with patient.  Reproductive/Obstetrics negative OB ROS                           Anesthesia Physical Anesthesia Plan  ASA: II  Anesthesia Plan: General   Post-op Pain Management:    Induction: Intravenous  PONV Risk Score and Plan: 3 and Treatment may vary due to age or medical condition, Ondansetron and Dexamethasone  Airway Management Planned: Oral ETT  Additional Equipment: None  Intra-op Plan:   Post-operative Plan: Extubation in OR  Informed Consent: I have reviewed the patients History and Physical, chart, labs and discussed the procedure including the risks, benefits and alternatives for the proposed anesthesia with the patient or authorized representative who has indicated his/her understanding and acceptance.     Dental advisory given  Plan Discussed with: CRNA  Anesthesia Plan Comments:  (Follows with cardiology for hx of nonobstructive CAD and secondary prevention. Last seen by Dr. Domenic Polite 02/12/20. Per note, "History of nonobstructive CAD by previous work-up with plan to continue medical therapy in the absence of progressive angina symptoms.  Plan to continue aspirin, Norvasc, Cozaar, and Pravachol." Advised to f/u in 6 months.  Labs from 02/06/20 reviewed. Elevated sed rate c/w possible infected SCS. Otherwise unremarkable.  Will need DOS labs, ekg and eval.  EKG 02/08/19: Sinus  Rhythm with PACs. Rate 71. IVCD.   Nuclear stress 03/13/18: Impression: Negative lexiscan myoview study for inducible ischemia. Ejection fraction lower limits of normal at 50% withs lightly dilated left ventricle.  TTE 03/13/18: Conclusions: Concentric hypertrophy of the left ventricle. Visual EF is 55-60%. Grade 1 dd. Left atrial cavity is mildly dilated. )      Anesthesia Quick Evaluation

## 2020-02-22 NOTE — Progress Notes (Addendum)
Anesthesia Chart Review: Same day workup  Follows with cardiology for hx of nonobstructive CAD and secondary prevention. Last seen by Dr. Domenic Polite 02/12/20. Per note, "History of nonobstructive CAD by previous work-up with plan to continue medical therapy in the absence of progressive angina symptoms.  Plan to continue aspirin, Norvasc, Cozaar, and Pravachol." Advised to f/u in 6 months.  Labs from 02/06/20 reviewed. Elevated sed rate c/w possible infected SCS. Otherwise unremarkable.  Will need DOS labs, ekg and eval.  EKG 02/08/19: Sinus  Rhythm with PACs. Rate 71. IVCD.   Nuclear stress 03/13/18: Impression: Negative lexiscan myoview study for inducible ischemia. Ejection fraction lower limits of normal at 50% withs lightly dilated left ventricle.  TTE 03/13/18: Conclusions: Concentric hypertrophy of the left ventricle. Visual EF is 55-60%. Grade 1 dd. Left atrial cavity is mildly dilated.  Cody Velez [facility] Stay [facility]/Anesthesiology Phone [phone] 02/22/2020 9:43 AM

## 2020-02-25 ENCOUNTER — Ambulatory Visit (HOSPITAL_COMMUNITY)
Admission: RE | Admit: 2020-02-25 | Discharge: 2020-02-25 | Disposition: A | Discharge status: Home / Self Care | Payer: Medicare Other | Attending: Neurological Surgery | Admitting: Neurological Surgery

## 2020-02-25 ENCOUNTER — Telehealth: Payer: Self-pay | Admitting: Internal Medicine

## 2020-02-25 ENCOUNTER — Ambulatory Visit (HOSPITAL_COMMUNITY): Payer: Medicare Other | Admitting: Physician Assistant

## 2020-02-25 ENCOUNTER — Encounter (HOSPITAL_COMMUNITY): Payer: Self-pay | Admitting: Neurological Surgery

## 2020-02-25 ENCOUNTER — Other Ambulatory Visit: Payer: Self-pay

## 2020-02-25 ENCOUNTER — Encounter (HOSPITAL_COMMUNITY)
Admission: RE | Disposition: A | Discharge status: Home / Self Care | Payer: Self-pay | Source: Home / Self Care | Attending: Neurological Surgery

## 2020-02-25 ENCOUNTER — Ambulatory Visit (HOSPITAL_COMMUNITY): Payer: Medicare Other

## 2020-02-25 DIAGNOSIS — G8929 Other chronic pain: Secondary | ICD-10-CM | POA: Insufficient documentation

## 2020-02-25 DIAGNOSIS — Z7982 Long term (current) use of aspirin: Secondary | ICD-10-CM | POA: Diagnosis not present

## 2020-02-25 DIAGNOSIS — Z8546 Personal history of malignant neoplasm of prostate: Secondary | ICD-10-CM | POA: Diagnosis not present

## 2020-02-25 DIAGNOSIS — M549 Dorsalgia, unspecified: Secondary | ICD-10-CM | POA: Diagnosis not present

## 2020-02-25 DIAGNOSIS — Y831 Surgical operation with implant of artificial internal device as the cause of abnormal reaction of the patient, or of later complication, without mention of misadventure at the time of the procedure: Secondary | ICD-10-CM | POA: Diagnosis not present

## 2020-02-25 DIAGNOSIS — F329 Major depressive disorder, single episode, unspecified: Secondary | ICD-10-CM | POA: Diagnosis not present

## 2020-02-25 DIAGNOSIS — K219 Gastro-esophageal reflux disease without esophagitis: Secondary | ICD-10-CM | POA: Insufficient documentation

## 2020-02-25 DIAGNOSIS — E785 Hyperlipidemia, unspecified: Secondary | ICD-10-CM | POA: Insufficient documentation

## 2020-02-25 DIAGNOSIS — E876 Hypokalemia: Secondary | ICD-10-CM | POA: Diagnosis not present

## 2020-02-25 DIAGNOSIS — H409 Unspecified glaucoma: Secondary | ICD-10-CM | POA: Insufficient documentation

## 2020-02-25 DIAGNOSIS — Z9181 History of falling: Secondary | ICD-10-CM | POA: Insufficient documentation

## 2020-02-25 DIAGNOSIS — I2510 Atherosclerotic heart disease of native coronary artery without angina pectoris: Secondary | ICD-10-CM | POA: Diagnosis not present

## 2020-02-25 DIAGNOSIS — Z79899 Other long term (current) drug therapy: Secondary | ICD-10-CM | POA: Insufficient documentation

## 2020-02-25 DIAGNOSIS — G4733 Obstructive sleep apnea (adult) (pediatric): Secondary | ICD-10-CM | POA: Insufficient documentation

## 2020-02-25 DIAGNOSIS — T85733A Infection and inflammatory reaction due to implanted electronic neurostimulator of spinal cord, electrode (lead), initial encounter: Secondary | ICD-10-CM | POA: Diagnosis not present

## 2020-02-25 DIAGNOSIS — Z993 Dependence on wheelchair: Secondary | ICD-10-CM | POA: Insufficient documentation

## 2020-02-25 DIAGNOSIS — F0390 Unspecified dementia without behavioral disturbance: Secondary | ICD-10-CM | POA: Insufficient documentation

## 2020-02-25 DIAGNOSIS — T85734A Infection and inflammatory reaction due to implanted electronic neurostimulator, generator, initial encounter: Secondary | ICD-10-CM | POA: Diagnosis not present

## 2020-02-25 DIAGNOSIS — F17210 Nicotine dependence, cigarettes, uncomplicated: Secondary | ICD-10-CM | POA: Diagnosis not present

## 2020-02-25 DIAGNOSIS — I10 Essential (primary) hypertension: Secondary | ICD-10-CM | POA: Diagnosis not present

## 2020-02-25 DIAGNOSIS — G8220 Paraplegia, unspecified: Secondary | ICD-10-CM | POA: Insufficient documentation

## 2020-02-25 DIAGNOSIS — J309 Allergic rhinitis, unspecified: Secondary | ICD-10-CM | POA: Insufficient documentation

## 2020-02-25 HISTORY — PX: SPINAL CORD STIMULATOR REMOVAL: SHX5379

## 2020-02-25 HISTORY — DX: Unspecified dementia, unspecified severity, without behavioral disturbance, psychotic disturbance, mood disturbance, and anxiety: F03.90

## 2020-02-25 HISTORY — DX: Personal history of urinary calculi: Z87.442

## 2020-02-25 HISTORY — DX: Dependence on wheelchair: Z99.3

## 2020-02-25 HISTORY — DX: Personal history of other medical treatment: Z92.89

## 2020-02-25 HISTORY — DX: Unspecified glaucoma: H40.9

## 2020-02-25 LAB — CBC
HCT: 47.4 % (ref 39.0–52.0)
Hemoglobin: 14.4 g/dL (ref 13.0–17.0)
MCH: 27.6 pg (ref 26.0–34.0)
MCHC: 30.4 g/dL (ref 30.0–36.0)
MCV: 91 fL (ref 80.0–100.0)
Platelets: 381 10*3/uL (ref 150–400)
RBC: 5.21 MIL/uL (ref 4.22–5.81)
RDW: 15.3 % (ref 11.5–15.5)
WBC: 6.2 10*3/uL (ref 4.0–10.5)
nRBC: 0 % (ref 0.0–0.2)

## 2020-02-25 LAB — BASIC METABOLIC PANEL
Anion gap: 9 (ref 5–15)
BUN: 11 mg/dL (ref 8–23)
CO2: 23 mmol/L (ref 22–32)
Calcium: 9.4 mg/dL (ref 8.9–10.3)
Chloride: 106 mmol/L (ref 98–111)
Creatinine, Ser: 1.13 mg/dL (ref 0.61–1.24)
GFR calc Af Amer: >60 mL/min (ref >60)
GFR calc non Af Amer: >60 mL/min (ref >60)
Glucose, Bld: 83 mg/dL (ref 70–99)
Potassium: 4.3 mmol/L (ref 3.5–5.1)
Sodium: 138 mmol/L (ref 135–145)

## 2020-02-25 SURGERY — LUMBAR SPINAL CORD STIMULATOR REMOVAL
Anesthesia: General | Site: Back

## 2020-02-25 MED ORDER — MENTHOL 3 MG MT LOZG: 1.0000 | LOZENGE | OROMUCOSAL | Status: DC | PRN

## 2020-02-25 MED ORDER — ROCURONIUM BROMIDE 10 MG/ML (PF) SYRINGE
PREFILLED_SYRINGE | INTRAVENOUS | Status: AC
  Filled 2020-02-25: qty 10

## 2020-02-25 MED ORDER — THROMBIN 5000 UNITS EX SOLR
CUTANEOUS | Status: AC
  Filled 2020-02-25: qty 5000

## 2020-02-25 MED ORDER — FENTANYL CITRATE (PF) 250 MCG/5ML IJ SOLN
INTRAMUSCULAR | Status: DC | PRN
  Administered 2020-02-25: 100 ug via INTRAVENOUS
  Administered 2020-02-25: 50 ug via INTRAVENOUS

## 2020-02-25 MED ORDER — CEFAZOLIN SODIUM-DEXTROSE 2-4 GM/100ML-% IV SOLN: 2.0000 g | Freq: Three times a day (TID) | INTRAVENOUS | Status: DC

## 2020-02-25 MED ORDER — SODIUM CHLORIDE 0.9 % IV SOLN: INTRAVENOUS | Status: DC | PRN

## 2020-02-25 MED ORDER — PROMETHAZINE HCL 25 MG/ML IJ SOLN: 6.2500 mg | INTRAMUSCULAR | Status: DC | PRN

## 2020-02-25 MED ORDER — ONDANSETRON HCL 4 MG/2ML IJ SOLN
INTRAMUSCULAR | Status: DC | PRN
  Administered 2020-02-25: 4 mg via INTRAVENOUS

## 2020-02-25 MED ORDER — SODIUM CHLORIDE 0.9 % IV SOLN: 250.0000 mL | INTRAVENOUS | Status: DC

## 2020-02-25 MED ORDER — HYDROMORPHONE HCL 1 MG/ML IJ SOLN: 0.2500 mg | INTRAMUSCULAR | Status: DC | PRN

## 2020-02-25 MED ORDER — FENTANYL CITRATE (PF) 250 MCG/5ML IJ SOLN
INTRAMUSCULAR | Status: AC
  Filled 2020-02-25: qty 5

## 2020-02-25 MED ORDER — OXYCODONE HCL 5 MG PO TABS: 10.0000 mg | ORAL_TABLET | Freq: Once | ORAL | Status: DC | PRN

## 2020-02-25 MED ORDER — ROCURONIUM BROMIDE 10 MG/ML (PF) SYRINGE
PREFILLED_SYRINGE | INTRAVENOUS | Status: DC | PRN
  Administered 2020-02-25: 60 mg via INTRAVENOUS
  Administered 2020-02-25: 40 mg via INTRAVENOUS

## 2020-02-25 MED ORDER — LIDOCAINE 2% (20 MG/ML) 5 ML SYRINGE
INTRAMUSCULAR | Status: DC | PRN
  Administered 2020-02-25: 100 mg via INTRAVENOUS

## 2020-02-25 MED ORDER — SUGAMMADEX SODIUM 200 MG/2ML IV SOLN
INTRAVENOUS | Status: DC | PRN
  Administered 2020-02-25: 300 mg via INTRAVENOUS

## 2020-02-25 MED ORDER — 0.9 % SODIUM CHLORIDE (POUR BTL) OPTIME
TOPICAL | Status: DC | PRN
  Administered 2020-02-25: 1000 mL

## 2020-02-25 MED ORDER — PROPOFOL 10 MG/ML IV BOLUS
INTRAVENOUS | Status: AC
  Filled 2020-02-25: qty 20

## 2020-02-25 MED ORDER — LACTATED RINGERS IV SOLN: INTRAVENOUS | Status: DC

## 2020-02-25 MED ORDER — BUPIVACAINE HCL (PF) 0.5 % IJ SOLN
INTRAMUSCULAR | Status: DC | PRN
  Administered 2020-02-25: 5 mL

## 2020-02-25 MED ORDER — SENNA 8.6 MG PO TABS: 1.0000 | ORAL_TABLET | Freq: Two times a day (BID) | ORAL | Status: DC

## 2020-02-25 MED ORDER — LIDOCAINE-EPINEPHRINE 1 %-1:100000 IJ SOLN
INTRAMUSCULAR | Status: AC
  Filled 2020-02-25: qty 1

## 2020-02-25 MED ORDER — SODIUM CHLORIDE 0.9% FLUSH: 3.0000 mL | INTRAVENOUS | Status: DC | PRN

## 2020-02-25 MED ORDER — ONDANSETRON HCL 4 MG/2ML IJ SOLN
INTRAMUSCULAR | Status: AC
Start: 2020-02-25 — End: ?
  Filled 2020-02-25: qty 2

## 2020-02-25 MED ORDER — CEFAZOLIN SODIUM-DEXTROSE 1-4 GM/50ML-% IV SOLN
INTRAVENOUS | Status: AC
  Filled 2020-02-25: qty 150

## 2020-02-25 MED ORDER — PROPOFOL 10 MG/ML IV BOLUS
INTRAVENOUS | Status: DC | PRN
  Administered 2020-02-25: 200 mg via INTRAVENOUS
  Administered 2020-02-25: 50 mg via INTRAVENOUS

## 2020-02-25 MED ORDER — DEXTROSE 5 % IV SOLN
3.0000 g | Freq: Once | INTRAVENOUS | Status: AC
  Administered 2020-02-25: 3 g via INTRAVENOUS

## 2020-02-25 MED ORDER — ACETAMINOPHEN 325 MG PO TABS: 650.0000 mg | ORAL_TABLET | ORAL | Status: DC | PRN

## 2020-02-25 MED ORDER — LIDOCAINE 2% (20 MG/ML) 5 ML SYRINGE
INTRAMUSCULAR | Status: AC
  Filled 2020-02-25: qty 5

## 2020-02-25 MED ORDER — DOCUSATE SODIUM 100 MG PO CAPS: 100.0000 mg | ORAL_CAPSULE | Freq: Two times a day (BID) | ORAL | Status: DC

## 2020-02-25 MED ORDER — POLYETHYLENE GLYCOL 3350 17 G PO PACK: 17.0000 g | PACK | Freq: Every day | ORAL | Status: DC | PRN

## 2020-02-25 MED ORDER — OXYCODONE HCL 5 MG/5ML PO SOLN: 10.0000 mg | Freq: Once | ORAL | Status: DC | PRN

## 2020-02-25 MED ORDER — ACETAMINOPHEN 650 MG RE SUPP: 650.0000 mg | RECTAL | Status: DC | PRN

## 2020-02-25 MED ORDER — DEXAMETHASONE SODIUM PHOSPHATE 10 MG/ML IJ SOLN
INTRAMUSCULAR | Status: AC
Start: 2020-02-25 — End: ?
  Filled 2020-02-25: qty 1

## 2020-02-25 MED ORDER — ONDANSETRON HCL 4 MG PO TABS: 4.0000 mg | ORAL_TABLET | Freq: Four times a day (QID) | ORAL | Status: DC | PRN

## 2020-02-25 MED ORDER — BISACODYL 10 MG RE SUPP: 10.0000 mg | Freq: Every day | RECTAL | Status: DC | PRN

## 2020-02-25 MED ORDER — BUPIVACAINE HCL (PF) 0.5 % IJ SOLN
INTRAMUSCULAR | Status: AC
  Filled 2020-02-25: qty 30

## 2020-02-25 MED ORDER — ACETAMINOPHEN 500 MG PO TABS: 1000.0000 mg | ORAL_TABLET | Freq: Once | ORAL | Status: AC

## 2020-02-25 MED ORDER — LIDOCAINE-EPINEPHRINE 1 %-1:100000 IJ SOLN
INTRAMUSCULAR | Status: DC | PRN
  Administered 2020-02-25: 5 mL

## 2020-02-25 MED ORDER — PHENOL 1.4 % MT LIQD: 1.0000 | OROMUCOSAL | Status: DC | PRN

## 2020-02-25 MED ORDER — DEXAMETHASONE SODIUM PHOSPHATE 10 MG/ML IJ SOLN
INTRAMUSCULAR | Status: DC | PRN
  Administered 2020-02-25: 4 mg via INTRAVENOUS

## 2020-02-25 MED ORDER — ACETAMINOPHEN 500 MG PO TABS
ORAL_TABLET | ORAL | Status: AC
  Administered 2020-02-25: 12:00:00 1000 mg via ORAL
  Filled 2020-02-25: qty 2

## 2020-02-25 MED ORDER — FLEET ENEMA 7-19 GM/118ML RE ENEM: 1.0000 | ENEMA | Freq: Once | RECTAL | Status: DC | PRN

## 2020-02-25 MED ORDER — THROMBIN 5000 UNITS EX SOLR: OROMUCOSAL | Status: DC | PRN

## 2020-02-25 MED ORDER — CEFAZOLIN SODIUM-DEXTROSE 2-4 GM/100ML-% IV SOLN
INTRAVENOUS | Status: AC
  Filled 2020-02-25: qty 200

## 2020-02-25 MED ORDER — ONDANSETRON HCL 4 MG/2ML IJ SOLN: 4.0000 mg | Freq: Four times a day (QID) | INTRAMUSCULAR | Status: DC | PRN

## 2020-02-25 SURGICAL SUPPLY — 52 items
ADH SKN CLS APL DERMABOND .7 (GAUZE/BANDAGES/DRESSINGS) ×1
APL SKNCLS STERI-STRIP NONHPOA (GAUZE/BANDAGES/DRESSINGS)
BAG DECANTER FOR FLEXI CONT (MISCELLANEOUS) ×3 IMPLANT
BENZOIN TINCTURE PRP APPL 2/3 (GAUZE/BANDAGES/DRESSINGS) IMPLANT
BLADE CLIPPER SURG (BLADE) IMPLANT
CARTRIDGE OIL MAESTRO DRILL (MISCELLANEOUS) ×1 IMPLANT
CLOSURE WOUND 1/2 X4 (GAUZE/BANDAGES/DRESSINGS)
COVER WAND RF STERILE (DRAPES) ×1 IMPLANT
DECANTER SPIKE VIAL GLASS SM (MISCELLANEOUS) ×3 IMPLANT
DERMABOND ADVANCED (GAUZE/BANDAGES/DRESSINGS) ×2
DERMABOND ADVANCED .7 DNX12 (GAUZE/BANDAGES/DRESSINGS) IMPLANT
DEVICE DISSECT PLASMABLAD 3.0S (MISCELLANEOUS) IMPLANT
DIFFUSER DRILL AIR PNEUMATIC (MISCELLANEOUS) ×1 IMPLANT
DRAPE LAPAROTOMY 100X72X124 (DRAPES) ×3 IMPLANT
DURAPREP 26ML APPLICATOR (WOUND CARE) ×3 IMPLANT
ELECT REM PT RETURN 9FT ADLT (ELECTROSURGICAL) ×3
ELECTRODE REM PT RTRN 9FT ADLT (ELECTROSURGICAL) ×1 IMPLANT
GAUZE 4X4 16PLY RFD (DISPOSABLE) IMPLANT
GLOVE BIOGEL PI IND STRL 8.5 (GLOVE) ×1 IMPLANT
GLOVE BIOGEL PI INDICATOR 8.5 (GLOVE) ×2
GLOVE ECLIPSE 8.5 STRL (GLOVE) ×3 IMPLANT
GLOVE EXAM NITRILE XL STR (GLOVE) IMPLANT
GLOVE SURG SS PI 7.5 STRL IVOR (GLOVE) ×10 IMPLANT
GOWN STRL REUS W/ TWL LRG LVL3 (GOWN DISPOSABLE) IMPLANT
GOWN STRL REUS W/ TWL XL LVL3 (GOWN DISPOSABLE) ×1 IMPLANT
GOWN STRL REUS W/TWL 2XL LVL3 (GOWN DISPOSABLE) ×3 IMPLANT
GOWN STRL REUS W/TWL LRG LVL3 (GOWN DISPOSABLE)
GOWN STRL REUS W/TWL XL LVL3 (GOWN DISPOSABLE) ×6
HEMOSTAT POWDER KIT SURGIFOAM (HEMOSTASIS) ×2 IMPLANT
KIT BASIN OR (CUSTOM PROCEDURE TRAY) ×3 IMPLANT
KIT TURNOVER KIT B (KITS) ×3 IMPLANT
NEEDLE HYPO 22GX1.5 SAFETY (NEEDLE) ×3 IMPLANT
NS IRRIG 1000ML POUR BTL (IV SOLUTION) ×3 IMPLANT
OIL CARTRIDGE MAESTRO DRILL (MISCELLANEOUS)
PACK LAMINECTOMY NEURO (CUSTOM PROCEDURE TRAY) ×3 IMPLANT
PAD ARMBOARD 7.5X6 YLW CONV (MISCELLANEOUS) ×3 IMPLANT
PATTIES SURGICAL .5 X1 (DISPOSABLE) ×2 IMPLANT
PLASMABLADE 3.0S (MISCELLANEOUS) ×3
SPONGE LAP 4X18 RFD (DISPOSABLE) IMPLANT
SPONGE SURGIFOAM ABS GEL SZ50 (HEMOSTASIS) ×1 IMPLANT
STAPLER SKIN PROX WIDE 3.9 (STAPLE) IMPLANT
STRIP CLOSURE SKIN 1/2X4 (GAUZE/BANDAGES/DRESSINGS) IMPLANT
SUT SILK 0 TIES 10X30 (SUTURE) IMPLANT
SUT VIC AB 0 CT1 18XCR BRD8 (SUTURE) ×1 IMPLANT
SUT VIC AB 0 CT1 8-18 (SUTURE) ×3
SUT VIC AB 2-0 CP2 18 (SUTURE) ×3 IMPLANT
SUT VIC AB 3-0 SH 8-18 (SUTURE) ×3 IMPLANT
SUT VIC AB 4-0 RB1 18 (SUTURE) ×2 IMPLANT
SYR CONTROL 10ML LL (SYRINGE) ×1 IMPLANT
TOWEL GREEN STERILE (TOWEL DISPOSABLE) ×3 IMPLANT
TOWEL GREEN STERILE FF (TOWEL DISPOSABLE) ×3 IMPLANT
WATER STERILE IRR 1000ML POUR (IV SOLUTION) ×3 IMPLANT

## 2020-02-25 NOTE — Anesthesia Procedure Notes (Signed)
Procedure Name: Intubation Performed by: Kingdavid Leinbach H, CRNA Pre-anesthesia Checklist: Patient identified, Emergency Drugs available, Suction available and Patient being monitored Patient Re-evaluated:Patient Re-evaluated prior to induction Oxygen Delivery Method: Circle System Utilized Preoxygenation: Pre-oxygenation with 100% oxygen Induction Type: IV induction Ventilation: Mask ventilation without difficulty and Oral airway inserted - appropriate to patient size Laryngoscope Size: Mac and 4 Grade View: Grade II Tube type: Oral Tube size: 7.5 mm Number of attempts: 1 Airway Equipment and Method: Stylet and Oral airway Placement Confirmation: ETT inserted through vocal cords under direct vision,  positive ETCO2 and breath sounds checked- equal and bilateral Secured at: 23 cm Tube secured with: Tape Dental Injury: Teeth and Oropharynx as per pre-operative assessment        

## 2020-02-25 NOTE — Transfer of Care (Signed)
Immediate Anesthesia Transfer of Care Note  Patient: Cody Velez  Procedure(s) Performed: LUMBAR SPINAL CORD STIMULATOR REMOVAL (N/A Back)  Patient Location: PACU  Anesthesia Type:General  Level of Consciousness: awake  Airway & Oxygen Therapy: Patient Spontanous Breathing  Post-op Assessment: Report given to RN and Post -op Vital signs reviewed and stable  Post vital signs: Reviewed and stable  Last Vitals:  Vitals Value Taken Time  BP 126/81 02/25/20 1402  Temp 36.6 C 02/25/20 1400  Pulse 70 02/25/20 1404  Resp 9 02/25/20 1404  SpO2 95 % 02/25/20 1404  Vitals shown include unvalidated device data.  Last Pain:  Vitals:   02/25/20 1400  TempSrc:   PainSc: 0-No pain      Patients Stated Pain Goal: 2 ([id number])  Complications: No apparent anesthesia complications

## 2020-02-25 NOTE — Evaluation (Signed)
Physical Therapy Evaluation and Discharge Patient Details Name: Cody Velez MRN: [id number] DOB: Mar 12, 1946 Today's Date: 02/25/2020   History of Present Illness  Pt is a 74 y/o male s/p lumbar spinal cord stimulator removal. 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 PMH includes HTN, glaucoma, OSA on CPAP, prostate cancer, and PTSD.   Clinical Impression  Patient evaluated by Physical Therapy with no further acute PT needs identified. All education has been completed and the patient has no further questions. Pt requiring min guard +2 for safety to perform transfers and to get dressed this session. Pt reports significant other will be able to assist as needed at d/c. Reports being in a WC at baseline and has all necessary equipment. Pt likely close to his baseline. Eager to d/c home today. See below for any follow-up Physical Therapy or equipment needs. PT is signing off. Thank you for this referral. If needs change, please re-consult.      Follow Up Recommendations No PT follow up;Supervision for mobility/OOB    Equipment Recommendations  None recommended by PT    Recommendations for Other Services       Precautions / Restrictions Precautions Precautions: Back Precaution Booklet Issued: No Restrictions Weight Bearing Restrictions: No      Mobility  Bed Mobility Overal bed mobility: Needs Assistance Bed Mobility: Sit to Supine       Sit to supine: Min guard   General bed mobility comments: Min guard for safety and steadying to come to sitting.   Transfers Overall transfer level: Needs assistance   Transfers: Sit to/from Stand;Stand Pivot Transfers Sit to Stand: Min guard;+2 safety/equipment Stand pivot transfers: Min guard;+2 safety/equipment       General transfer comment: Min guard +2 to stand by pulling up on WC. Able to pull up pants with min guard. Performed stand pivot with min guard +2 for safety.   Ambulation/Gait              General Gait Details: Pt uses WC at baseline   Stairs            Wheelchair Mobility    Modified Rankin (Stroke Patients Only)       Balance Overall balance assessment: Mild deficits observed, not formally tested                                           Pertinent Vitals/Pain Pain Assessment: No/denies pain    Home Living Family/patient expects to be discharged to:: Private residence Living Arrangements: Spouse/significant other Available Help at Discharge: Family;Available 24 hours/day Type of Home: House Home Access: Ramped entrance     Home Layout: Two level;Able to live on main level with bedroom/bathroom Home Equipment: Shower seat;Wheelchair - manual      Prior Function Level of Independence: Independent with assistive device(s)         Comments: Reports using WC for mobility. Transfers independently. Reports he is able to bathe and dress himself.      Hand Dominance        Extremity/Trunk Assessment   Upper Extremity Assessment Upper Extremity Assessment: Defer to OT evaluation    Lower Extremity Assessment Lower Extremity Assessment: RLE deficits/detail;LLE deficits/detail RLE Deficits / Details: At least 3/5 throughout. Paresthesia at baseline.  LLE Deficits / Details: At least 3/5 throughout. Paresthesia at baseline.  Communication   Communication: No difficulties  Cognition Arousal/Alertness: Awake/alert Behavior During Therapy: WFL for tasks assessed/performed Overall Cognitive Status: Within Functional Limits for tasks assessed                                        General Comments      Exercises     Assessment/Plan    PT Assessment Patent does not need any further PT services  PT Problem List         PT Treatment Interventions      PT Goals (Current goals can be found in the Care Plan section)  Acute Rehab PT Goals Patient Stated Goal: to go home  PT Goal  Formulation: With patient Time For Goal Achievement: 02/25/20 Potential to Achieve Goals: Good    Frequency     Barriers to discharge        Co-evaluation PT/OT/SLP Co-Evaluation/Treatment: Yes Reason for Co-Treatment: To address functional/ADL transfers PT goals addressed during session: Mobility/safety with mobility;Balance         AM-PAC PT "6 Clicks" Mobility  Outcome Measure Help needed turning from your back to your side while in a flat bed without using bedrails?: A Little Help needed moving from lying on your back to sitting on the side of a flat bed without using bedrails?: A Little Help needed moving to and from a bed to a chair (including a wheelchair)?: A Little Help needed standing up from a chair using your arms (e.g., wheelchair or bedside chair)?: A Little Help needed to walk in hospital room?: A Lot Help needed climbing 3-5 steps with a railing? : A Lot 6 Click Score: 16    End of Session Equipment Utilized During Treatment: Gait belt Activity Tolerance: Patient tolerated treatment well Patient left: in chair;with call bell/phone within reach;with nursing/sitter in room(in WC ) Nurse Communication: Mobility status PT Visit Diagnosis: Other abnormalities of gait and mobility (R26.89);Difficulty in walking, not elsewhere classified (R26.2)    Time: [id number] PT Time Calculation (min) (ACUTE ONLY): 12 min   Charges:   PT Evaluation $PT Eval Low Complexity: 1 Low          Lou Miner, DPT  Acute Rehabilitation Services  Pager: [phone] Office: [phone]   Rudean Hitt 02/25/2020, 3:48 PM

## 2020-02-25 NOTE — Evaluation (Signed)
Occupational Therapy Evaluation Patient Details Name: Cody Velez MRN: [id number] DOB: [birth year] Today's Date: 02/25/2020    History of Present Illness Pt is a 74 y/o male s/p lumbar spinal cord stimulator removal. 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 PMH includes HTN, glaucoma, OSA on CPAP, prostate cancer, and PTSD.    Clinical Impression   This 74 yo male admitted and underwent above presents to acute at a Mod I level for basic ADLs pta and close to baseline now (currently at [facility] guard A +2 safety/lines, due to just had surgery)--has girlfriend at home to A prn. No further OT needs, we will sign off.    Follow Up Recommendations  No OT follow up;Supervision - Intermittent    Equipment Recommendations  None recommended by OT       Precautions / Restrictions Precautions Precautions: Back Precaution Booklet Issued: No Restrictions Weight Bearing Restrictions: No      Mobility Bed Mobility Overal bed mobility: Needs Assistance Bed Mobility: Sit to Supine       Sit to supine: Min guard   General bed mobility comments: Min guard for safety and steadying to come to sitting.   Transfers Overall transfer level: Needs assistance   Transfers: Sit to/from Stand;Stand Pivot Transfers Sit to Stand: Min guard;+2 safety/equipment Stand pivot transfers: Min guard;+2 safety/equipment       General transfer comment: Min guard +2 to stand by pulling up on WC. Able to pull up pants with min guard. Performed stand pivot with min guard +2 for safety.     Balance Overall balance assessment: Mild deficits observed, not formally tested                                         ADL either performed or assessed with clinical judgement   ADL                                         General ADL Comments: Could donn clothes but needed min guard A +2 safety with standing and stand pivot (to W/C as if to commode)      Vision Patient Visual Report: No change from baseline              Pertinent Vitals/Pain Pain Assessment: No/denies pain     Hand Dominance  right   Extremity/Trunk Assessment Upper Extremity Assessment Upper Extremity Assessment: Overall WFL for tasks assessed   Lower Extremity Assessment Lower Extremity Assessment: RLE deficits/detail;LLE deficits/detail RLE Deficits / Details: At least 3/5 throughout. Paresthesia at baseline.  LLE Deficits / Details: At least 3/5 throughout. Paresthesia at baseline.        Communication Communication Communication: No difficulties   Cognition Arousal/Alertness: Awake/alert Behavior During Therapy: WFL for tasks assessed/performed Overall Cognitive Status: Within Functional Limits for tasks assessed                                       Home Living Family/patient expects to be discharged to:: Private residence Living Arrangements: Spouse/significant other Available Help at Discharge: Family;Available 24 hours/day Type of Home: House Home Access: Ramped entrance     Home Layout: Two level;Able to live on main level  with bedroom/bathroom     Bathroom Shower/Tub: Walk-in shower         Home Equipment: Shower seat;Wheelchair - manual          Prior Functioning/Environment Level of Independence: Independent with assistive device(s)        Comments: Reports using WC for mobility. Transfers independently. Reports he is able to bathe and dress himself.         OT Problem List: Impaired balance (sitting and/or standing)         OT Goals(Current goals can be found in the care plan section) Acute Rehab OT Goals Patient Stated Goal: to go home today  OT Frequency:             Co-evaluation PT/OT/SLP Co-Evaluation/Treatment: Yes Reason for Co-Treatment: To address functional/ADL transfers PT goals addressed during session: Mobility/safety with mobility;Balance OT goals addressed during session:  ADL's and self-care      AM-PAC OT "6 Clicks" Daily Activity     Outcome Measure Help from another person eating meals?: None Help from another person taking care of personal grooming?: A Little Help from another person toileting, which includes using toliet, bedpan, or urinal?: A Little Help from another person bathing (including washing, rinsing, drying)?: A Little Help from another person to put on and taking off regular upper body clothing?: A Little Help from another person to put on and taking off regular lower body clothing?: A Little 6 Click Score: 19   End of Session Equipment Utilized During Treatment: Gait belt Nurse Communication: (pt ready to go)  Activity Tolerance: Patient tolerated treatment well Patient left: (in W/C)  OT Visit Diagnosis: Unsteadiness on feet (R26.81);Other abnormalities of gait and mobility (R26.89)                Time: [id number] OT Time Calculation (min): 12 min Charges:  OT General Charges $OT Visit: 1 Visit OT Evaluation $OT Eval Low Complexity: 1 Low  Cathy OTR/L Acute Rehab Services Pager [phone] Office [phone]     02/25/2020, 3:57 PM

## 2020-02-25 NOTE — Anesthesia Postprocedure Evaluation (Signed)
Anesthesia Post Note  Patient: Cody Velez  Procedure(s) Performed: LUMBAR SPINAL CORD STIMULATOR REMOVAL (N/A Back)     Patient location during evaluation: PACU Anesthesia Type: General Level of consciousness: awake and alert, patient cooperative and oriented Pain management: pain level controlled Vital Signs Assessment: post-procedure vital signs reviewed and stable Respiratory status: spontaneous breathing, nonlabored ventilation and respiratory function stable Cardiovascular status: blood pressure returned to baseline and stable Postop Assessment: no apparent nausea or vomiting Anesthetic complications: no    Last Vitals:  Vitals:   02/25/20 1430 02/25/20 1445  BP: 134/78 132/78  Pulse: 65 69  Resp: 16 11  Temp: 36.7 C   SpO2: 96% 96%    Last Pain:  Vitals:   02/25/20 1445  TempSrc:   PainSc: 0-No pain                 Brenleigh Collet,E. Maisen Klingler

## 2020-02-25 NOTE — Discharge Summary (Signed)
Physician Discharge Summary  Patient ID: Cody Velez MRN: [id number] DOB/AGE: 08/06/1946 74 y.o.  Admit date: 02/25/2020 Discharge date: 02/25/2020  Admission Diagnoses: Infected spinal cord stimulator.  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  History of paraplegia secondary to spinal abscess discitis and osteomyelitis in the thoracic spine Active Problems:   * No active hospital problems. *   Discharged Condition: good  Hospital Course: Patient had an outpatient procedure to remove his infected spinal cord stimulator Gram stain at the time of removal yielded gram-positive cocci in clusters and there was gross pus noted at the time of removal.  Consults: None  Significant Diagnostic Studies: None  Treatments: surgery: Removal of spinal cord stimulator  Discharge Exam: Blood pressure 134/78, pulse 65, temperature 98.1 F (36.7 C), resp. rate 16, height 6\' 3"  (1.905 m), weight (!) 138.8 kg, SpO2 96 %. Incisions clean and dry  Disposition: Discharge disposition: 01-Home or Self Care       Discharge Instructions    Call MD for:  redness, tenderness, or signs of infection (pain, swelling, redness, odor or green/yellow discharge around incision site)   Complete by: As directed    Call MD for:  severe uncontrolled pain   Complete by: As directed    Call MD for:  temperature >100.4   Complete by: As directed    Diet - low sodium heart healthy   Complete by: As directed    Discharge instructions   Complete by: As directed    Okay to shower. Do not apply salves or appointments to incision. No heavy lifting with the upper extremities greater than 10 pounds.   Incentive spirometry RT   Complete by: As directed    Increase activity slowly   Complete by: As directed      Allergies as of 02/25/2020   No Known Allergies     Medication List    TAKE these  medications   alfuzosin 10 MG 24 hr tablet Commonly known as: UROXATRAL Take 10 mg by mouth daily with breakfast.   amLODipine 10 MG tablet Commonly known as: NORVASC Take 1 tablet (10 mg total) by mouth daily.   aspirin 81 MG tablet Take 1 tablet (81 mg total) by mouth daily.   baclofen 10 MG tablet Commonly known as: LIORESAL TAKE ONE TABLET IN THE MORNING AND MIDDAY, THEN TWO TABLETS AT NIGHT What changed:   how much to take  how to take this  when to take this  additional instructions   cephALEXin 250 MG capsule Commonly known as: KEFLEX Take 1 capsule (250 mg total) by mouth 4 (four) times daily.   cycloSPORINE 0.05 % ophthalmic emulsion Commonly known as: RESTASIS 1 DROP INTO BOTH EYES TWICE A DAY FOR DRY EYES What changed:   how much to take  how to take this  when to take this  additional instructions   docusate sodium 100 MG capsule Commonly known as: COLACE Take 2 capsules (200 mg total) by mouth 2 (two) times daily.   donepezil 10 MG tablet Commonly known as: ARICEPT Take 1 tablet (10 mg total) by mouth at bedtime. What changed: when to take this   finasteride 5 MG tablet Commonly known as: PROSCAR Take 1 tablet (5 mg total) by mouth daily. Reported on 05/04/2016   fluticasone 50 MCG/ACT nasal spray Commonly known as: FLONASE Place 1 spray into both nostrils daily as needed for allergies.  gabapentin 400 MG capsule Commonly known as: NEURONTIN Take 1 capsule (400 mg total) by mouth 3 (three) times daily. What changed: when to take this   latanoprost 0.005 % ophthalmic solution Commonly known as: XALATAN Place 1 drop into both eyes at bedtime.   losartan 100 MG tablet Commonly known as: COZAAR Take 100 mg by mouth daily.   Melatonin 10 MG Tabs Take 10 mg by mouth at bedtime.   memantine 10 MG tablet Commonly known as: NAMENDA Take 1 tablet (10 mg total) by mouth 2 (two) times daily.   omeprazole 20 MG capsule Commonly known as:  PRILOSEC Take 2 capsules (40 mg total) by mouth daily.   Oxycodone HCl 10 MG Tabs Take 1 tablet (10 mg total) by mouth every 4 (four) hours as needed (moderate to severe pain).   polyethylene glycol 17 g packet Commonly known as: MIRALAX / GLYCOLAX Take 17 g by mouth daily. What changed:   when to take this  reasons to take this   potassium chloride SA 20 MEQ tablet Commonly known as: KLOR-CON Take 1 tablet (20 mEq total) by mouth daily.   pravastatin 10 MG tablet Commonly known as: PRAVACHOL Take 1 tablet (10 mg total) by mouth every evening.   traZODone 100 MG tablet Commonly known as: DESYREL Take 1 tablet (100 mg total) by mouth at bedtime.   venlafaxine XR 150 MG 24 hr capsule Commonly known as: EFFEXOR-XR Take 1 capsule (150 mg total) by mouth 2 (two) times daily.        Signed: Blanchie Dessert Apple Dearmas 02/25/2020, 2:58 PM

## 2020-02-25 NOTE — Op Note (Signed)
Date of surgery: February 25, 2020 Preoperative diagnosis is: Infected spinal cord stimulator Postoperative diagnosis: Infected spinal cord stimulator Procedure: Removal of infected spinal cord stimulator with cultures and Gram stain Surgeon: Kristeen Miss Anesthesia: General endotracheal Indications: Cody Velez is a 74 year old individual who had a severe spinal infection with epidural abscess that rendered him paraplegic about 2 years ago.  He had previously had a spinal cord stimulator placed a few months before that episode and he has been using a spinal cord stimulator to help control neck pain and headache however he has been feeling poorly and is concerned that he has recurrent infection which has been supported by the fact that he had of swelling in the region of the spinal cord stimulator elevated sed rate fever.  He had been placed on some courses of oral antibiotic which seemed to improve the situation only slightly most recently he had a re check of his sed rate which was noted to be 60.  He desires to have the device removed.  Procedure: The patient was brought to the operating room supine on the stretcher after the smooth induction of general endotracheal anesthesia he was carefully turned prone.  The back was prepped with alcohol DuraPrep in the region of his implanted battery pack in the left flank and in the thoracic spine where a dissection had been created to place the leads.  Both areas were infiltrated with a total of 10 cc of lidocaine with epinephrine mixed 50-50 with half percent Marcaine.  The dissection in the inferior pocket was performed first and on piercing the pocket with the battery pack and purulent fluid was encountered.  Gram stain and cultures were obtained from this fluid.  Pocket was then enlarged and there was noted to be a thickened area of fibrotic material where the leads went into the subcutaneous space.  This area was carefully dissected out and the battery pack  itself was removed from this region leads could then be mobilized slightly and the thickened sensation of scar that was encountered.  The second incision was then created in the upper thoracic spine and the dissection was carried inferiorly the leads were identified in this region were noted to be coiled using fluoroscopic guidance to localize where the connectors were then dissected down onto the connectors and each connector was individually dissected free it had been tied in place with some PDS type suture.  This was released and the connectors were removed and along with this the cervical electrodes were removed.  The connectors were then cut at the distal end near the battery pack and the wires were pulled back towards the thoracic region.  In this area the tissues were edematous and the scar tissue was taken hemostasis in the soft tissues was obtained and both wounds were irrigated copiously with antibiotic irrigating solution then the wounds were closed with 2-0 Vicryl in the deep layers 3-0 Vicryl in the subcu tenia's layers and 4-0 Vicryl in the subcuticular layers Dermabond was placed on the skin blood loss for the procedure was less than 10 cc.  Gram stain results from the fluid revealed gram-positive cocci in clusters.  He will be given 3 g of Ancef in the recovery room.

## 2020-02-25 NOTE — Telephone Encounter (Signed)
I received a telephone call from Dr. Kristeen Miss this afternoon.  He removed with Mr. Friedland spinal cord stimulator and found evidence of pocket infection.  He remove the generator and spinal leads.  Operative Gram stain shows gram-positive cocci in clusters.  Mr. Maybin recently noted improvement in the pain, swelling and redness around his stimulator pocket when he was taking empiric cephalexin.  Dr. Ellene Route will give a dose of IV cefazolin and then ask him to restart cephalexin. He has an E visit follow-up with me tomorrow.

## 2020-02-25 NOTE — H&P (Signed)
Cody Velez is an 74 y.o. male.   Chief Complaint: Infected spinal cord stimulator HPI: Cody Velez is a 74 year old individual who is had significant problems with persistently elevated sed rate and redness around the stimulator generator in his flank he has had uncomfortable feeling despite trials of antibiotics has had some temporary reprieve but has continued back pain he has shut off his spinal cord stimulator notes that is not having any further effect at this time he desires to have this removed and indeed if it is infected it needs to be removed.  He is now admitted for that procedure  Past Medical History:  Diagnosis Date  . Allergic rhinitis   . Arthritis   . B12 deficiency   . Cervicogenic headache 01/28/2016   no current problems per patient 02/22/20  . Childhood asthma    as a child, no problems as an adult  . Chronic back pain   . Coronary atherosclerosis of native coronary artery    Mild nonobstructive CAD at catheterization January 2015  . Dementia ([place])   . Depression   . Essential hypertension   . Falls   . GERD (gastroesophageal reflux disease)   . Glaucoma   . History of blood transfusion    with a surgery procedure  . History of cerebrovascular disease 07/23/2015  . History of kidney stones    surgery to removed  . History of pneumonia 02/2011  . Hyperlipidemia   . Memory difficulty 07/23/2015  . OSA (obstructive sleep apnea)    CPAP - Dr. [name] - uses cpap every night  . Pneumonia   . Prostate cancer ([place])   . PTSD (post-traumatic stress disorder)    [place]  . Rectal bleeding   . Wheelchair bound     Past Surgical History:  Procedure Laterality Date  . APPLICATION OF ROBOTIC ASSISTANCE FOR SPINAL PROCEDURE N/A 03/28/2018   Procedure: APPLICATION OF ROBOTIC ASSISTANCE FOR SPINAL PROCEDURE;  Surgeon: Kristeen Miss, MD;  Location: [facility];  Service: Neurosurgery;  Laterality: N/A;  . BACK SURGERY  02/14/12   lumbar OR #7; "today redid L1L2; replaced screws;  added bone from hip"  . BILATERAL KNEE ARTHROSCOPY    . COLONOSCOPY  10/15/2008   Dr. [name]: tubular adenoma   . COLONOSCOPY  12/17/2003   [id number] rectal and colon  . COLONOSCOPY N/A 09/05/2014   Procedure: COLONOSCOPY;  Surgeon: Daneil Dolin, MD;  [facility];  Service: Endoscopy;  Laterality: N/A;  7:30-rescheduled 9/17 to [place] notified pt  . CYSTOSCOPY WITH STENT PLACEMENT Right 01/27/2016   Procedure: CYSTOSCOPY WITH STENT PLACEMENT;  Surgeon: [name], MD;  [facility]: [facility];  Service: Urology;  Laterality: Right;  . CYSTOSCOPY/RETROGRADE/URETEROSCOPY/STONE EXTRACTION WITH BASKET Right 01/27/2016   Procedure: CYSTOSCOPY, RIGHT RETROGRADE, RIGHT URETEROSCOPY, STONE EXTRACTION ;  Surgeon: [name], MD;  [facility];  Service: Urology;  Laterality: Right;  . ESOPHAGOGASTRODUODENOSCOPY  10/15/2008     Dr Rourk:Schatzki's ring status post dilation and disruption via 40 F Maloney dilator/ otherwise unremarkable esophagus, small hiatal hernia, multiple fundal gland polyps not manipulated, gastritis, negative H.pylori  . ESOPHAGOGASTRODUODENOSCOPY  06/21/02   [id number] sliding hiatal hernia with mild changes of reflux esophagitis limited to gastroesophageal junction.  Noncritical ring at distal esophagus, 3 cm proximal to gastroesophageal junction/Antral gastritis  . [place]   "broke face playing softball"  . FRACTURE SURGERY     "left wrist; broke it; took spur off"  .  HOLMIUM LASER APPLICATION Right [id number]   Procedure: HOLMIUM LASER APPLICATION;  Surgeon: [name], MD;  Location[facility];  Service: Urology;  Laterality: Right;  . KNEE ARTHROSCOPY Right 05/18/2018   Procedure: PARTIAL MEDIAL MENISECTOMY AND SURGICAL LAVAGE AND CHONDROPLASTY;  Surgeon: [name], MD;  Location: [facility];  Service: Orthopedics;  Laterality: Right;  . LEFT HEART CATHETERIZATION WITH CORONARY ANGIOGRAM N/A 12/27/2013   Procedure: LEFT  HEART CATHETERIZATION WITH CORONARY ANGIOGRAM;  Surgeon: [name], MD;  Location: [facility] C[facility];  Service: Cardiovascular;  Laterality: N/A;  . neck epidural    . POSTERIOR LUMBAR FUSION 4 LEVEL N/A 03/28/2018   Procedure: Thoracic eight -Lumbar two- FIXATION WITH SCREW PLACEMENT, DECOMPRESSION Thoracic ten-Thoracic eleven  FOR OSTEOMYELITIS;  Surgeon: Kristeen Miss, MD;  Location: [facility];  Service: Neurosurgery;  Laterality: N/A;  . SHOULDER SURGERY Bilateral   . TEE WITHOUT CARDIOVERSION N/A 03/21/2018   Procedure: TRANSESOPHAGEAL ECHOCARDIOGRAM (TEE) WITH PROPOFOL;  Surgeon: [name], MD;  [facility];  Service: Cardiovascular;  Laterality: N/A;    Family History  Problem Relation Age of Onset  . Emphysema Father   . Heart failure Father   . Lung cancer Father   . CAD Father   . Colon cancer Mother   . Stroke Mother   . Breast cancer Mother   . Stroke Sister   . Heart attack Brother   . Dementia Paternal Uncle   . Emphysema Maternal Grandmother   . Stroke Maternal Grandmother   . Asthma Other        grandson  . Heart disease Paternal Grandfather   . Anesthesia problems Neg Hx   . Hypotension Neg Hx   . Malignant hyperthermia Neg Hx   . Pseudochol deficiency Neg Hx    Social History:  reports that he quit smoking about 61 years ago. His smoking use included cigarettes. His smokeless tobacco use includes chew. He reports previous alcohol use. He reports that he does not use drugs.  Allergies: No Known Allergies  Medications Prior to Admission  Medication Sig Dispense Refill  . alfuzosin (UROXATRAL) 10 MG 24 hr tablet Take 10 mg by mouth daily with breakfast.    . amLODipine (NORVASC) 10 MG tablet Take 1 tablet (10 mg total) by mouth daily. 30 tablet 0  . baclofen (LIORESAL) 10 MG tablet TAKE ONE TABLET IN THE MORNING AND MIDDAY, THEN TWO TABLETS AT NIGHT (Patient taking differently: Take 10-20 mg by mouth See admin instructions. Take 1 tablet (10 mg) by  mouth in the morning & at lunch, then take 2 tablets (20 mg) by mouth at night.) 360 each 3  . cycloSPORINE (RESTASIS) 0.05 % ophthalmic emulsion 1 DROP INTO BOTH EYES TWICE A DAY FOR DRY EYES (Patient taking differently: Place 1 drop into both eyes 2 (two) times daily. ) 0.4 mL 0  . docusate sodium (COLACE) 100 MG capsule Take 2 capsules (200 mg total) by mouth 2 (two) times daily. 10 capsule 0  . donepezil (ARICEPT) 10 MG tablet Take 1 tablet (10 mg total) by mouth at bedtime. (Patient taking differently: Take 10 mg by mouth in the morning and at bedtime. ) 30 tablet 0  . finasteride (PROSCAR) 5 MG tablet Take 1 tablet (5 mg total) by mouth daily. Reported on 05/04/2016 30 tablet 0  . fluticasone (FLONASE) 50 MCG/ACT nasal spray Place 1 spray into both nostrils daily as needed for allergies. 16 g 2  . gabapentin (NEURONTIN) 400 MG capsule  Take 1 capsule (400 mg total) by mouth 3 (three) times daily. (Patient taking differently: Take 400 mg by mouth 2 (two) times daily. ) 90 capsule 0  . latanoprost (XALATAN) 0.005 % ophthalmic solution Place 1 drop into both eyes at bedtime. 2.5 mL 0  . losartan (COZAAR) 100 MG tablet Take 100 mg by mouth daily.    . Melatonin 10 MG TABS Take 10 mg by mouth at bedtime.    . memantine (NAMENDA) 10 MG tablet Take 1 tablet (10 mg total) by mouth 2 (two) times daily. 60 tablet 0  . omeprazole (PRILOSEC) 20 MG capsule Take 2 capsules (40 mg total) by mouth daily. 30 capsule 0  . Oxycodone HCl 10 MG TABS Take 1 tablet (10 mg total) by mouth every 4 (four) hours as needed (moderate to severe pain). 15 tablet 0  . polyethylene glycol (MIRALAX / GLYCOLAX) packet Take 17 g by mouth daily. (Patient taking differently: Take 17 g by mouth daily as needed (constipation.). ) 14 each 0  . potassium chloride SA (K-DUR,KLOR-CON) 20 MEQ tablet Take 1 tablet (20 mEq total) by mouth daily. 30 tablet 0  . pravastatin (PRAVACHOL) 10 MG tablet Take 1 tablet (10 mg total) by mouth every  evening. 30 tablet 0  . traZODone (DESYREL) 100 MG tablet Take 1 tablet (100 mg total) by mouth at bedtime. 30 tablet 0  . venlafaxine XR (EFFEXOR-XR) 150 MG 24 hr capsule Take 1 capsule (150 mg total) by mouth 2 (two) times daily. 60 capsule 0  . aspirin 81 MG tablet Take 1 tablet (81 mg total) by mouth daily. 30 tablet 0  . cephALEXin (KEFLEX) 250 MG capsule Take 1 capsule (250 mg total) by mouth 4 (four) times daily. (Patient not taking: Reported on 02/20/2020) 28 capsule 0    No results found for this or any previous visit (from the past 48 hour(s)). No results found.  Review of Systems  Constitutional: Positive for appetite change and fatigue.  HENT: Negative.   Eyes: Negative.   Respiratory: Negative.   Cardiovascular: Negative.   Gastrointestinal: Negative.   Genitourinary: Negative.   Musculoskeletal: Positive for arthralgias, back pain, myalgias and neck pain.  Neurological: Positive for weakness and numbness.  Hematological: Negative.   Psychiatric/Behavioral: Negative.     Height 6\' 3"  (1.905 m), weight (!) 140.6 kg. Physical Exam  Constitutional: He is oriented to person, place, and time. He appears well-developed and well-nourished.  Obese  Eyes: Pupils are equal, round, and reactive to light. EOM are normal.  Neck:  Limited range of motion with limited flexion extension about 50% of normal  Cardiovascular: Normal rate and regular rhythm.  Respiratory: Effort normal and breath sounds normal.  GI: Bowel sounds are normal.  Musculoskeletal:     Comments: Moderate weakness of the lower extremities with some myelopathic features and mild spasticity decreased reflexes in the patella and the Achilles upper extremity strength and reflexes intact Cranial nerve examination is within the limits of normal  Neurological: He is alert and oriented to person, place, and time.  Skin: Skin is warm and dry.  Psychiatric: He has a normal mood and affect. His behavior is normal.  Judgment and thought content normal.     Assessment/Plan Infected spinal cord stimulator Plan: Removal of spinal cord stimulator.  Earleen Newport, MD 02/25/2020, 11:14 AM

## 2020-02-26 ENCOUNTER — Telehealth (INDEPENDENT_AMBULATORY_CARE_PROVIDER_SITE_OTHER): Payer: Medicare Other | Admitting: Internal Medicine

## 2020-02-26 DIAGNOSIS — M4644 Discitis, unspecified, thoracic region: Secondary | ICD-10-CM | POA: Diagnosis not present

## 2020-02-26 DIAGNOSIS — I2510 Atherosclerotic heart disease of native coronary artery without angina pectoris: Secondary | ICD-10-CM

## 2020-02-26 DIAGNOSIS — T85733A Infection and inflammatory reaction due to implanted electronic neurostimulator of spinal cord, electrode (lead), initial encounter: Secondary | ICD-10-CM | POA: Insufficient documentation

## 2020-02-26 DIAGNOSIS — T85733D Infection and inflammatory reaction due to implanted electronic neurostimulator of spinal cord, electrode (lead), subsequent encounter: Secondary | ICD-10-CM

## 2020-02-26 NOTE — Progress Notes (Signed)
Virtual Visit via Video Note  I connected with@ on 02/26/20 at  3:30 PM EST by a video enabled telemedicine application and verified that I am speaking with the correct person using two identifiers.  Location: Patient: Home Provider: [facility]   I discussed the limitations of evaluation and management by telemedicine and the availability of in person appointments. The patient expressed understanding and agreed to proceed.  Cody Velez for Infectious Disease  Patient Active Problem List   Diagnosis Date Noted  . Pseudogout of knee     Priority: High  . Discitis of thoracic region     Priority: High  . Bacteremia due to methicillin susceptible Staphylococcus aureus (MSSA) 03/18/2018    Priority: High  . Pyogenic arthritis of right knee joint ([employer])   . Falls frequently 05/08/2018  . Fall   . Sleep disturbance   . Post-op pain   . Hypokalemia   . Morbid obesity ([place])   . Postoperative pain   . DVT, lower extremity, distal, acute, bilateral ([place])   . Acute blood loss anemia   . Anemia of chronic disease   . Essential hypertension   . Bacteremia   . Myelopathy ([place]) 03/30/2018  . Paraplegia ([place])   . Postlaminectomy syndrome, lumbar region   . Epidural abscess   . Abdominal distension   . Encephalopathy   . Weakness of both lower extremities   . Spinal cord stimulator status   . Staphylococcus aureus sepsis ([place]) 03/20/2018  . Obesity 03/18/2018  . Nephrolithiasis 03/16/2018  . AKI (acute kidney injury) ([place]) 03/16/2018  . Cognitive impairment 03/16/2018  . Chronic pain syndrome 03/16/2018  . Cervicogenic headache 01/28/2016  . B12 deficiency 01/28/2016  . Memory difficulty 07/23/2015  . History of cerebrovascular disease 07/23/2015  . FH: colon cancer 08/13/2014  . Hx of adenomatous colonic polyps 08/13/2014  . Chronic back pain 05/30/2012  . CAD, NATIVE VESSEL 07/17/2010  . HYPERCHOLESTEROLEMIA 10/31/2009  . SMOKELESS TOBACCO ABUSE 10/31/2009  . Obstructive sleep  apnea 10/31/2009  . Essential hypertension, benign 10/31/2009  . GERD 10/31/2009    Patient's Medications  New Prescriptions   No medications on file  Previous Medications   ALFUZOSIN (UROXATRAL) 10 MG 24 HR TABLET    Take 10 mg by mouth daily with breakfast.   AMLODIPINE (NORVASC) 10 MG TABLET    Take 1 tablet (10 mg total) by mouth daily.   ASPIRIN 81 MG TABLET    Take 1 tablet (81 mg total) by mouth daily.   BACLOFEN (LIORESAL) 10 MG TABLET    TAKE ONE TABLET IN THE MORNING AND MIDDAY, THEN TWO TABLETS AT NIGHT   CEPHALEXIN (KEFLEX) 250 MG CAPSULE    Take 1 capsule (250 mg total) by mouth 4 (four) times daily.   CYCLOSPORINE (RESTASIS) 0.05 % OPHTHALMIC EMULSION    1 DROP INTO BOTH EYES TWICE A DAY FOR DRY EYES   DOCUSATE SODIUM (COLACE) 100 MG CAPSULE    Take 2 capsules (200 mg total) by mouth 2 (two) times daily.   DONEPEZIL (ARICEPT) 10 MG TABLET    Take 1 tablet (10 mg total) by mouth at bedtime.   FINASTERIDE (PROSCAR) 5 MG TABLET    Take 1 tablet (5 mg total) by mouth daily. Reported on 05/04/2016   FLUTICASONE (FLONASE) 50 MCG/ACT NASAL SPRAY    Place 1 spray into both nostrils daily as needed for allergies.   GABAPENTIN (NEURONTIN) 400 MG CAPSULE    Take 1 capsule (400 mg total) by  mouth 3 (three) times daily.   LATANOPROST (XALATAN) 0.005 % OPHTHALMIC SOLUTION    Place 1 drop into both eyes at bedtime.   LOSARTAN (COZAAR) 100 MG TABLET    Take 100 mg by mouth daily.   MELATONIN 10 MG TABS    Take 10 mg by mouth at bedtime.   MEMANTINE (NAMENDA) 10 MG TABLET    Take 1 tablet (10 mg total) by mouth 2 (two) times daily.   OMEPRAZOLE (PRILOSEC) 20 MG CAPSULE    Take 2 capsules (40 mg total) by mouth daily.   OXYCODONE HCL 10 MG TABS    Take 1 tablet (10 mg total) by mouth every 4 (four) hours as needed (moderate to severe pain).   POLYETHYLENE GLYCOL (MIRALAX / GLYCOLAX) PACKET    Take 17 g by mouth daily.   POTASSIUM CHLORIDE SA (K-DUR,KLOR-CON) 20 MEQ TABLET    Take 1 tablet (20  mEq total) by mouth daily.   PRAVASTATIN (PRAVACHOL) 10 MG TABLET    Take 1 tablet (10 mg total) by mouth every evening.   TRAZODONE (DESYREL) 100 MG TABLET    Take 1 tablet (100 mg total) by mouth at bedtime.   VENLAFAXINE XR (EFFEXOR-XR) 150 MG 24 HR CAPSULE    Take 1 capsule (150 mg total) by mouth 2 (two) times daily.  Modified Medications   No medications on file  Discontinued Medications   No medications on file    History of Present Illness: I connected with Cody Velez for a video visit this afternoon.  Dr. Ellene Route, his neurosurgeon, removed his spinal stimulator yesterday afternoon.  There was evidence of pocket infection.  Operative Gram stain showed gram-positive cocci in clusters and cultures are growing staph aureus with susceptibilities pending.  He and his girlfriend said that this morning they noted that there was new redness in his upper back that they had not noted previously.  He says that he is having more pain in his mid to upper back now.  Review of Systems  Constitutional: Negative for chills, diaphoresis and fever.  Musculoskeletal: Positive for back pain.    Past Medical History:  Diagnosis Date  . Allergic rhinitis   . Arthritis   . B12 deficiency   . Cervicogenic headache 01/28/2016   no current problems per patient 02/22/20  . Childhood asthma    as a child, no problems as an adult  . Chronic back pain   . Coronary atherosclerosis of native coronary artery    Mild nonobstructive CAD at catheterization January 2015  . Dementia ([place])   . Depression   . Essential hypertension   . Falls   . GERD (gastroesophageal reflux disease)   . Glaucoma   . History of blood transfusion    with a surgery procedure  . History of cerebrovascular disease 07/23/2015  . History of kidney stones    surgery to removed  . History of pneumonia 02/2011  . Hyperlipidemia   . Memory difficulty 07/23/2015  . OSA (obstructive sleep apnea)    CPAP - Dr. [name] - uses cpap every night  .  Pneumonia   . Prostate cancer ([place])   . PTSD (post-traumatic stress disorder)    [place]  . Rectal bleeding   . Wheelchair bound     Social History   Tobacco Use  . Smoking status: Former Smoker    Types: Cigarettes    Quit date: 12/20/1958    Years since quitting: 61.2  . Smokeless tobacco: Current User  Types: Chew  Substance Use Topics  . Alcohol use: Not Currently    Alcohol/week: 0.0 standard drinks    Comment: couple bottles of wine per month  . Drug use: No    Types: Marijuana    Comment: "last used marijuana ~ 1969"    Family History  Problem Relation Age of Onset  . Emphysema Father   . Heart failure Father   . Lung cancer Father   . CAD Father   . Colon cancer Mother   . Stroke Mother   . Breast cancer Mother   . Stroke Sister   . Heart attack Brother   . Dementia Paternal Uncle   . Emphysema Maternal Grandmother   . Stroke Maternal Grandmother   . Asthma Other        grandson  . Heart disease Paternal Grandfather   . Anesthesia problems Neg Hx   . Hypotension Neg Hx   . Malignant hyperthermia Neg Hx   . Pseudochol deficiency Neg Hx     No Known Allergies  Health Maintenance  Topic Date Due  . TETANUS/TDAP  08/24/1965  . PNA vac Low Risk Adult (1 of 2 - PCV13) 08/25/2011  . COLONOSCOPY  09/05/2024  . INFLUENZA VACCINE  Completed  . Hepatitis C Screening  Completed    Observations/Objective: Cody Velez appeared pleasant and in no distress.  I asked him to turn around and have his girlfriend show me the area of his back that they were concerned about.  There appeared to be a new incision over his lower thoracic spine with surrounding erythema.  They were only aware of the incision on his left lower back where the stimulator was removed.  Assessment and Plan: Arnette Norris has developed a spinal stimulator pocket infection.  I called Dr. Ellene Route this afternoon and he confirmed that there was an incision over his left lower back where the stimulator was removed  and over his lower thoracic spine where the leads were removed.  I plan on continuing oral cephalexin pending final antibiotic susceptibility results.  He is scheduled to follow-up with Dr. Ellene Route in the next 1 to 2 weeks at which time we will make a decision about further spine imaging.  Follow Up Instructions: 1. Continue cephalexin pending final antibiotic susceptibility results   I discussed the assessment and treatment plan with the patient. The patient was provided an opportunity to ask questions and all were answered. The patient agreed with the plan and demonstrated an understanding of the instructions.   The patient was advised to call back or seek an in-person evaluation if the symptoms worsen or if the condition fails to improve as anticipated.  I provided 15 minutes of non-face-to-face time during this encounter.  Michel Bickers, MD [facility] for [facility] [place] [facility] [phone] pager   [phone] cell 02/26/2020, 4:51 PM

## 2020-02-27 ENCOUNTER — Telehealth: Payer: Self-pay | Admitting: Internal Medicine

## 2020-02-27 NOTE — Telephone Encounter (Signed)
I called and spoke with Cody Velez today.  I let them know that I had spoken to Dr. Ellene Route again and that the slightly red and uncomfortable area in his mid upper back is one of the 2 incisions that Dr. Ellene Route made day before yesterday when he removed the spinal stimulator.  The operative cultures from the stimulator pocket are growing MSSA.  Cody Velez said that he is feeling "great".  I asked him to continue oral cephalexin at least until he follows up with me on 04/08/2020.

## 2020-03-01 LAB — AEROBIC/ANAEROBIC CULTURE W GRAM STAIN (SURGICAL/DEEP WOUND)

## 2020-03-03 ENCOUNTER — Ambulatory Visit: Payer: Medicare Other | Admitting: Internal Medicine

## 2020-03-05 ENCOUNTER — Ambulatory Visit (INDEPENDENT_AMBULATORY_CARE_PROVIDER_SITE_OTHER): Payer: Medicare Other | Admitting: Internal Medicine

## 2020-03-05 ENCOUNTER — Other Ambulatory Visit: Payer: Self-pay

## 2020-03-05 DIAGNOSIS — I2510 Atherosclerotic heart disease of native coronary artery without angina pectoris: Secondary | ICD-10-CM

## 2020-03-05 DIAGNOSIS — T85733D Infection and inflammatory reaction due to implanted electronic neurostimulator of spinal cord, electrode (lead), subsequent encounter: Secondary | ICD-10-CM | POA: Diagnosis not present

## 2020-03-05 NOTE — Progress Notes (Signed)
[facility] Disease  Patient Active Problem List   Diagnosis Date Noted  . Infection of spinal cord stimulator ([place]) 02/26/2020    Priority: High  . Discitis of thoracic region     Priority: High  . Bacteremia due to methicillin susceptible Staphylococcus aureus (MSSA) 03/18/2018    Priority: High  . Pseudogout of knee   . Pyogenic arthritis of right knee joint ([place])   . Falls frequently 05/08/2018  . Fall   . Sleep disturbance   . Post-op pain   . Hypokalemia   . Morbid obesity ([place])   . Postoperative pain   . DVT, lower extremity, distal, acute, bilateral ([place])   . Acute blood loss anemia   . Anemia of chronic disease   . Essential hypertension   . Bacteremia   . Myelopathy ([place]) 03/30/2018  . Paraplegia ([place])   . Postlaminectomy syndrome, lumbar region   . Epidural abscess   . Abdominal distension   . Encephalopathy   . Weakness of both lower extremities   . Spinal cord stimulator status   . Staphylococcus aureus sepsis ([place]) 03/20/2018  . Obesity 03/18/2018  . Nephrolithiasis 03/16/2018  . AKI (acute kidney injury) ([place]) 03/16/2018  . Cognitive impairment 03/16/2018  . Chronic pain syndrome 03/16/2018  . Cervicogenic headache 01/28/2016  . B12 deficiency 01/28/2016  . Memory difficulty 07/23/2015  . History of cerebrovascular disease 07/23/2015  . FH: colon cancer 08/13/2014  . Hx of adenomatous colonic polyps 08/13/2014  . Chronic back pain 05/30/2012  . CAD, NATIVE VESSEL 07/17/2010  . HYPERCHOLESTEROLEMIA 10/31/2009  . SMOKELESS TOBACCO ABUSE 10/31/2009  . Obstructive sleep apnea 10/31/2009  . Essential hypertension, benign 10/31/2009  . GERD 10/31/2009    Patient's Medications  New Prescriptions   No medications on file  Previous Medications   ALFUZOSIN (UROXATRAL) 10 MG 24 HR TABLET    Take 10 mg by mouth daily with breakfast.   AMLODIPINE (NORVASC) 10 MG TABLET    Take 1 tablet (10 mg total) by mouth daily.   ASPIRIN  81 MG TABLET    Take 1 tablet (81 mg total) by mouth daily.   BACLOFEN (LIORESAL) 10 MG TABLET    TAKE ONE TABLET IN THE MORNING AND MIDDAY, THEN TWO TABLETS AT NIGHT   CEPHALEXIN (KEFLEX) 250 MG CAPSULE    Take 1 capsule (250 mg total) by mouth 4 (four) times daily.   CYCLOSPORINE (RESTASIS) 0.05 % OPHTHALMIC EMULSION    1 DROP INTO BOTH EYES TWICE A DAY FOR DRY EYES   DOCUSATE SODIUM (COLACE) 100 MG CAPSULE    Take 2 capsules (200 mg total) by mouth 2 (two) times daily.   DONEPEZIL (ARICEPT) 10 MG TABLET    Take 1 tablet (10 mg total) by mouth at bedtime.   FINASTERIDE (PROSCAR) 5 MG TABLET    Take 1 tablet (5 mg total) by mouth daily. Reported on 05/04/2016   FLUTICASONE (FLONASE) 50 MCG/ACT NASAL SPRAY    Place 1 spray into both nostrils daily as needed for allergies.   GABAPENTIN (NEURONTIN) 400 MG CAPSULE    Take 1 capsule (400 mg total) by mouth 3 (three) times daily.   LATANOPROST (XALATAN) 0.005 % OPHTHALMIC SOLUTION    Place 1 drop into both eyes at bedtime.   LOSARTAN (COZAAR) 100 MG TABLET    Take 100 mg by mouth daily.   MELATONIN 10 MG TABS    Take 10 mg by mouth  at bedtime.   MEMANTINE (NAMENDA) 10 MG TABLET    Take 1 tablet (10 mg total) by mouth 2 (two) times daily.   OMEPRAZOLE (PRILOSEC) 20 MG CAPSULE    Take 2 capsules (40 mg total) by mouth daily.   OXYCODONE HCL 10 MG TABS    Take 1 tablet (10 mg total) by mouth every 4 (four) hours as needed (moderate to severe pain).   POLYETHYLENE GLYCOL (MIRALAX / GLYCOLAX) PACKET    Take 17 g by mouth daily.   POTASSIUM CHLORIDE SA (K-DUR,KLOR-CON) 20 MEQ TABLET    Take 1 tablet (20 mEq total) by mouth daily.   PRAVASTATIN (PRAVACHOL) 10 MG TABLET    Take 1 tablet (10 mg total) by mouth every evening.   TRAZODONE (DESYREL) 100 MG TABLET    Take 1 tablet (100 mg total) by mouth at bedtime.   VENLAFAXINE XR (EFFEXOR-XR) 150 MG 24 HR CAPSULE    Take 1 capsule (150 mg total) by mouth 2 (two) times daily.  Modified Medications   No  medications on file  Discontinued Medications   No medications on file    Subjective: Cody Velez is in for his routine follow-up visit.  In March of 2019 he developed MSSA bacteremia complicated by thoracic discitis and lower extremity weakness.  He underwent incision and drainage and thoracolumbar fusion by Dr. Ellene Route.  He completed a long course of IV antibiotics and then converted to oral cephalexin.  He completed 15 months of therapy in June of last year.  He did well until several weeks ago when he began to notice some left-sided back pain with redness and swelling over his spinal nerve stimulator.  He saw his PCP, Dr. Asencion Noble, who found that his sed rate was elevated.  He tells me that Dr. Willey Blade also noted redness and swelling over the stimulator.  He was started on empiric cephalexin and referred to his pain clinic in [place].  The notes there indicate that there were no abnormalities over the stimulator but Cody Velez says that the doctor there did note some redness but thought it was due to feel scratching over the area.  He followed up with his neurosurgeon, Dr. Ellene Route.  His inflammatory markers were still elevated.  Dr. Ellene Route had him turn off the stimulator.  He did not have any increase in his pain.   He underwent removal of his spinal stimulator on 02/25/2020.  There was clear evidence of pocket infection and operative cultures grew MSSA.  He is bothered by itching around his incisions but otherwise is feeling much better.  He has not had any problems tolerating his cephalexin.  He says that he is not having any back pain.  Review of Systems: Review of Systems  Constitutional: Positive for weight loss. Negative for chills, diaphoresis and fever.       He has been on the keto diet and has lost 20 pounds intentionally.  Respiratory: Negative for cough.   Cardiovascular: Negative for chest pain.  Gastrointestinal: Negative for abdominal pain, diarrhea, nausea and vomiting.  Musculoskeletal:  Negative for back pain.  Skin: Positive for itching.  Neurological: Positive for focal weakness.    Past Medical History:  Diagnosis Date  . Allergic rhinitis   . Arthritis   . B12 deficiency   . Cervicogenic headache 01/28/2016   no current problems per patient 02/22/20  . Childhood asthma    as a child, no problems as an adult  . Chronic back pain   .  Coronary atherosclerosis of native coronary artery    Mild nonobstructive CAD at catheterization January 2015  . Dementia ([place])   . Depression   . Essential hypertension   . Falls   . GERD (gastroesophageal reflux disease)   . Glaucoma   . History of blood transfusion    with a surgery procedure  . History of cerebrovascular disease 07/23/2015  . History of kidney stones    surgery to removed  . History of pneumonia 02/2011  . Hyperlipidemia   . Memory difficulty 07/23/2015  . OSA (obstructive sleep apnea)    CPAP - Dr. [name] - uses cpap every night  . Pneumonia   . Prostate cancer ([place])   . PTSD (post-traumatic stress disorder)    [place]  . Rectal bleeding   . Wheelchair bound     Social History   Tobacco Use  . Smoking status: Former Smoker    Types: Cigarettes    Quit date: 12/20/1958    Years since quitting: 61.2  . Smokeless tobacco: Current User    Types: Chew  Substance Use Topics  . Alcohol use: Not Currently    Alcohol/week: 0.0 standard drinks    Comment: couple bottles of wine per month  . Drug use: No    Types: Marijuana    Comment: "last used marijuana ~ 1969"    Family History  Problem Relation Age of Onset  . Emphysema Father   . Heart failure Father   . Lung cancer Father   . CAD Father   . Colon cancer Mother   . Stroke Mother   . Breast cancer Mother   . Stroke Sister   . Heart attack Brother   . Dementia Paternal Uncle   . Emphysema Maternal Grandmother   . Stroke Maternal Grandmother   . Asthma Other        grandson  . Heart disease Paternal Grandfather   . Anesthesia problems Neg  Hx   . Hypotension Neg Hx   . Malignant hyperthermia Neg Hx   . Pseudochol deficiency Neg Hx     No Known Allergies  Objective: Vitals:   03/05/20 1125  BP: 132/80  Pulse: 71  Temp: 98.5 F (36.9 C)  TempSrc: Oral  Weight: (!) 309 lb (140.2 kg)   Body mass index is 38.62 kg/m.  Physical Exam Constitutional:      Comments: He is very pleasant and talkative as usual.  He is seated in his wheelchair.  Skin:    Comments: He has a red maculopapular rash surrounding both of his back incisions.  There is no wound drainage.  Psychiatric:        Mood and Affect: Mood normal.           Problem List Items Addressed This Visit      High   Infection of spinal cord stimulator [facility])    He is doing better on oral cephalexin following explantation of his spinal cord stimulator.  I believe that this is a different infection than his original MSSA bacteremia and thoracic discitis given that it occurred 9 months after completing previous antibiotic therapy.  He does not have any evidence of vertebral infection currently.  I plan on 4 more weeks of oral cephalexin.  I suspect that his itching is due to an allergic reaction to Dermabond.  He will follow-up here in 4 weeks.          Michel Bickers, MD [place] Infectious Disease [facility]  Group [id number] pager   [phone] cell 03/05/2020, 11:54 AM

## 2020-03-05 NOTE — Assessment & Plan Note (Signed)
He is doing better on oral cephalexin following explantation of his spinal cord stimulator.  I believe that this is a different infection than his original MSSA bacteremia and thoracic discitis given that it occurred 9 months after completing previous antibiotic therapy.  He does not have any evidence of vertebral infection currently.  I plan on 4 more weeks of oral cephalexin.  I suspect that his itching is due to an allergic reaction to Dermabond.  He will follow-up here in 4 weeks.

## 2020-03-14 ENCOUNTER — Other Ambulatory Visit: Payer: Self-pay

## 2020-03-14 ENCOUNTER — Ambulatory Visit
Admission: EM | Admit: 2020-03-14 | Discharge: 2020-03-14 | Disposition: A | Discharge status: Home / Self Care | Payer: Federal, State, Local not specified - PPO | Attending: Emergency Medicine | Admitting: Emergency Medicine

## 2020-03-14 DIAGNOSIS — Z1152 Encounter for screening for COVID-19: Secondary | ICD-10-CM | POA: Diagnosis not present

## 2020-03-14 DIAGNOSIS — Z01812 Encounter for preprocedural laboratory examination: Secondary | ICD-10-CM

## 2020-03-14 DIAGNOSIS — Z20822 Contact with and (suspected) exposure to covid-19: Secondary | ICD-10-CM | POA: Diagnosis not present

## 2020-03-14 DIAGNOSIS — M4644 Discitis, unspecified, thoracic region: Secondary | ICD-10-CM

## 2020-03-14 DIAGNOSIS — Z112 Encounter for screening for other bacterial diseases: Secondary | ICD-10-CM

## 2020-03-14 NOTE — ED Provider Notes (Signed)
[facility]    CSN: [id number] Arrival date & time: 03/14/20  1511      History   Chief Complaint No chief complaint on file.   HPI Cody Velez is a 74 y.o. male.   Who presented to the urgent care for complaint of Covid testing for dental work .  Denies sick exposure to COVID, flu or strep.  Denies recent travel.  Denies aggravating or alleviating symptoms.  Denies previous COVID infection.   Denies fever, chills, fatigue, nasal congestion, rhinorrhea, sore throat, cough, SOB, wheezing, chest pain, nausea, vomiting, changes in bowel or bladder habits.   Patient would like to have CBC and ESR repeat due to spinal cord stimulator infection  The history is provided by the patient. No language interpreter was used.    Past Medical History:  Diagnosis Date  . Allergic rhinitis   . Arthritis   . B12 deficiency   . Cervicogenic headache 01/28/2016   no current problems per patient 02/22/20  . Childhood asthma    as a child, no problems as an adult  . Chronic back pain   . Coronary atherosclerosis of native coronary artery    Mild nonobstructive CAD at catheterization January 2015  . Dementia ([address])   . Depression   . Essential hypertension   . Falls   . GERD (gastroesophageal reflux disease)   . Glaucoma   . History of blood transfusion    with a surgery procedure  . History of cerebrovascular disease 07/23/2015  . History of kidney stones    surgery to removed  . History of pneumonia 02/2011  . Hyperlipidemia   . Memory difficulty 07/23/2015  . OSA (obstructive sleep apnea)    CPAP - Dr. [name] - uses cpap every night  . Pneumonia   . Prostate cancer ([place])   . PTSD (post-traumatic stress disorder)    [place]  . Rectal bleeding   . Wheelchair bound     Patient Active Problem List   Diagnosis Date Noted  . Infection of spinal cord stimulator ([place]) 02/26/2020  . Pseudogout of knee   . Pyogenic arthritis of right knee joint ([place])   . Falls frequently  05/08/2018  . Fall   . Sleep disturbance   . Post-op pain   . Hypokalemia   . Morbid obesity ([place])   . Postoperative pain   . DVT, lower extremity, distal, acute, bilateral ([place])   . Acute blood loss anemia   . Anemia of chronic disease   . Essential hypertension   . Bacteremia   . Myelopathy ([place]) 03/30/2018  . Paraplegia ([place])   . Postlaminectomy syndrome, lumbar region   . Epidural abscess   . Abdominal distension   . Encephalopathy   . Weakness of both lower extremities   . Discitis of thoracic region   . Spinal cord stimulator status   . Staphylococcus aureus sepsis ([place]) 03/20/2018  . Bacteremia due to methicillin susceptible Staphylococcus aureus (MSSA) 03/18/2018  . Obesity 03/18/2018  . Nephrolithiasis 03/16/2018  . AKI (acute kidney injury) ([place]) 03/16/2018  . Cognitive impairment 03/16/2018  . Chronic pain syndrome 03/16/2018  . Cervicogenic headache 01/28/2016  . B12 deficiency 01/28/2016  . Memory difficulty 07/23/2015  . History of cerebrovascular disease 07/23/2015  . FH: colon cancer 08/13/2014  . Hx of adenomatous colonic polyps 08/13/2014  . Chronic back pain 05/30/2012  . CAD, NATIVE VESSEL 07/17/2010  . HYPERCHOLESTEROLEMIA 10/31/2009  . SMOKELESS TOBACCO ABUSE 10/31/2009  . Obstructive sleep  apnea 10/31/2009  . Essential hypertension, benign 10/31/2009  . GERD 10/31/2009    Past Surgical History:  Procedure Laterality Date  . APPLICATION OF ROBOTIC ASSISTANCE FOR SPINAL PROCEDURE N/A 03/28/2018   Procedure: APPLICATION OF ROBOTIC ASSISTANCE FOR SPINAL PROCEDURE;  Surgeon: Kristeen Miss, MD;  Location: [place];  Service: Neurosurgery;  Laterality: N/A;  . BACK SURGERY  02/14/12   lumbar OR #7; "today redid L1L2; replaced screws; added bone from hip"  . BILATERAL KNEE ARTHROSCOPY    . COLONOSCOPY  10/15/2008   Dr. [name]: tubular adenoma   . COLONOSCOPY  12/17/2003   NTI:[id number] rectal and colon  . COLONOSCOPY N/A 09/05/2014   Procedure: COLONOSCOPY;   Surgeon: Daneil Dolin, MD;  [facility];  Service: Endoscopy;  Laterality: N/A;  7:30-rescheduled 9/17 to [place] notified pt  . CYSTOSCOPY WITH STENT PLACEMENT Right 01/27/2016   Procedure: CYSTOSCOPY WITH STENT PLACEMENT;  Surgeon: [name], MD;  Location: [facility];  Service: Urology;  Laterality: Right;  . CYSTOSCOPY/RETROGRADE/URETEROSCOPY/STONE EXTRACTION WITH BASKET Right 01/27/2016   Procedure: CYSTOSCOPY, RIGHT RETROGRADE, RIGHT URETEROSCOPY, STONE EXTRACTION ;  Surgeon: [name], MD;  Location: [facility];  Service: Urology;  Laterality: Right;  . ESOPHAGOGASTRODUODENOSCOPY  10/15/2008     Dr Rourk:Schatzki's ring status post dilation and disruption via 42 F Maloney dilator/ otherwise unremarkable esophagus, small hiatal hernia, multiple fundal gland polyps not manipulated, gastritis, negative H.pylori  . ESOPHAGOGASTRODUODENOSCOPY  06/21/02   QMG:QQPYP sliding hiatal hernia with mild changes of reflux esophagitis limited to gastroesophageal junction.  Noncritical ring at distal esophagus, 3 cm proximal to gastroesophageal junction/Antral gastritis  . [place]   "broke face playing softball"  . FRACTURE SURGERY     "left wrist; broke it; took spur off"  . HOLMIUM LASER APPLICATION Right 08/25/931   Procedure: HOLMIUM LASER APPLICATION;  Surgeon: [name], MD;  Location: [facility];  Service: Urology;  Laterality: Right;  . KNEE ARTHROSCOPY Right 05/18/2018   Procedure: PARTIAL MEDIAL MENISECTOMY AND SURGICAL LAVAGE AND CHONDROPLASTY;  Surgeon: [name], MD;  Location: [place];  Service: Orthopedics;  Laterality: Right;  . LEFT HEART CATHETERIZATION WITH CORONARY ANGIOGRAM N/A 12/27/2013   Procedure: LEFT HEART CATHETERIZATION WITH CORONARY ANGIOGRAM;  Surgeon: [name], MD;  Location: [facility] C[facility];  Service: Cardiovascular;  Laterality: N/A;  . neck epidural    . POSTERIOR LUMBAR FUSION 4 LEVEL N/A 03/28/2018   Procedure:  Thoracic eight -Lumbar two- FIXATION WITH SCREW PLACEMENT, DECOMPRESSION Thoracic ten-Thoracic eleven  FOR OSTEOMYELITIS;  Surgeon: Kristeen Miss, MD;  Location: [place];  Service: Neurosurgery;  Laterality: N/A;  . SHOULDER SURGERY Bilateral   . SPINAL CORD STIMULATOR REMOVAL N/A 02/25/2020   Procedure: LUMBAR SPINAL CORD STIMULATOR REMOVAL;  Surgeon: Kristeen Miss, MD;  Location: [place];  Service: Neurosurgery;  Laterality: N/A;  . TEE WITHOUT CARDIOVERSION N/A 03/21/2018   Procedure: TRANSESOPHAGEAL ECHOCARDIOGRAM (TEE) WITH PROPOFOL;  Surgeon: [name], MD;  [facility];  Service: Cardiovascular;  Laterality: N/A;       Home Medications    Prior to Admission medications   Medication Sig Start Date End Date Taking? Authorizing Provider  alfuzosin (UROXATRAL) 10 MG 24 hr tablet Take 10 mg by mouth daily with breakfast.    [provider]  amLODipine (NORVASC) 10 MG tablet Take 1 tablet (10 mg total) by mouth daily. 05/22/18   Bonnielee Haff, MD  aspirin 81 MG tablet Take 1 tablet (81  mg total) by mouth daily. 05/22/18   Bonnielee Haff, MD  baclofen (LIORESAL) 10 MG tablet TAKE ONE TABLET IN THE MORNING AND MIDDAY, THEN TWO TABLETS AT NIGHT Patient taking differently: Take 10-20 mg by mouth See admin instructions. Take 1 tablet (10 mg) by mouth in the morning & at lunch, then take 2 tablets (20 mg) by mouth at night. 06/12/18   Kathrynn Ducking, MD  cephALEXin (KEFLEX) 250 MG capsule Take 1 capsule (250 mg total) by mouth 4 (four) times daily. Patient not taking: Reported on 02/20/2020 02/06/20   Emerson Monte, FNP  cycloSPORINE (RESTASIS) 0.05 % ophthalmic emulsion 1 DROP INTO BOTH EYES TWICE A DAY FOR DRY EYES Patient taking differently: Place 1 drop into both eyes 2 (two) times daily.  05/22/18   Bonnielee Haff, MD  docusate sodium (COLACE) 100 MG capsule Take 2 capsules (200 mg total) by mouth 2 (two) times daily. 05/22/18   Bonnielee Haff, MD  donepezil (ARICEPT)  10 MG tablet Take 1 tablet (10 mg total) by mouth at bedtime. Patient taking differently: Take 10 mg by mouth in the morning and at bedtime.  05/22/18   Bonnielee Haff, MD  finasteride (PROSCAR) 5 MG tablet Take 1 tablet (5 mg total) by mouth daily. Reported on 05/04/2016 05/22/18   Bonnielee Haff, MD  fluticasone [facility]) 50 MCG/ACT nasal spray Place 1 spray into both nostrils daily as needed for allergies. 05/22/18   Bonnielee Haff, MD  gabapentin (NEURONTIN) 400 MG capsule Take 1 capsule (400 mg total) by mouth 3 (three) times daily. Patient taking differently: Take 400 mg by mouth 2 (two) times daily.  05/22/18   Bonnielee Haff, MD  latanoprost (XALATAN) 0.005 % ophthalmic solution Place 1 drop into both eyes at bedtime. 05/22/18   Bonnielee Haff, MD  losartan (COZAAR) 100 MG tablet Take 100 mg by mouth daily.    [provider]  Melatonin 10 MG TABS Take 10 mg by mouth at bedtime.    [provider]  memantine (NAMENDA) 10 MG tablet Take 1 tablet (10 mg total) by mouth 2 (two) times daily. 05/22/18   Bonnielee Haff, MD  omeprazole (PRILOSEC) 20 MG capsule Take 2 capsules (40 mg total) by mouth daily. 05/22/18   Bonnielee Haff, MD  Oxycodone HCl 10 MG TABS Take 1 tablet (10 mg total) by mouth every 4 (four) hours as needed (moderate to severe pain). 05/22/18   Bonnielee Haff, MD  polyethylene glycol [facility] Flor[facility]aveling) packet Take 17 g by mouth daily. Patient taking differently: Take 17 g by mouth daily as needed (constipation.).  05/23/18   Bonnielee Haff, MD  potassium chloride SA (K-DUR,KLOR-CON) 20 MEQ tablet Take 1 tablet (20 mEq total) by mouth daily. 05/22/18   Bonnielee Haff, MD  pravastatin (PRAVACHOL) 10 MG tablet Take 1 tablet (10 mg total) by mouth every evening. 05/22/18   Bonnielee Haff, MD  traZODone (DESYREL) 100 MG tablet Take 1 tablet (100 mg total) by mouth at bedtime. 05/22/18   Bonnielee Haff, MD  venlafaxine XR (EFFEXOR-XR) 150 MG 24 hr capsule Take 1 capsule (150 mg  total) by mouth 2 (two) times daily. 05/22/18   Bonnielee Haff, MD    Family History Family History  Problem Relation Age of Onset  . Emphysema Father   . Heart failure Father   . Lung cancer Father   . CAD Father   . Colon cancer Mother   . Stroke Mother   . Breast cancer Mother   .  Stroke Sister   . Heart attack Brother   . Dementia Paternal Uncle   . Emphysema Maternal Grandmother   . Stroke Maternal Grandmother   . Asthma Other        grandson  . Heart disease Paternal Grandfather   . Anesthesia problems Neg Hx   . Hypotension Neg Hx   . Malignant hyperthermia Neg Hx   . Pseudochol deficiency Neg Hx     Social History Social History   Tobacco Use  . Smoking status: Former Smoker    Types: Cigarettes    Quit date: 12/20/1958    Years since quitting: 61.2  . Smokeless tobacco: Current User    Types: Chew  Substance Use Topics  . Alcohol use: Not Currently    Alcohol/week: 0.0 standard drinks    Comment: couple bottles of wine per month  . Drug use: No    Types: Marijuana    Comment: "last used marijuana ~ 1969"     Allergies   Patient has no known allergies.   Review of Systems Review of Systems  Constitutional: Negative.   HENT: Negative.   Respiratory: Negative.   Cardiovascular: Negative.   Gastrointestinal: Negative.   Neurological: Negative.      Physical Exam Triage Vital Signs ED Triage Vitals  Enc Vitals Group     BP      Pulse      Resp      Temp      Temp src      SpO2      Weight      Height      Head Circumference      Peak Flow      Pain Score      Pain Loc      Pain Edu?      Excl. in [place]?    No data found.  Updated Vital Signs BP 113/65   Pulse 77   Temp 98.1 F (36.7 C)   Resp 18   Wt 298 lb (135.2 kg)   SpO2 94%   BMI 37.25 kg/m   Visual Acuity Right Eye Distance:   Left Eye Distance:   Bilateral Distance:    Right Eye Near:   Left Eye Near:    Bilateral Near:     Physical Exam Vitals and nursing  note reviewed.  Constitutional:      General: He is not in acute distress.    Appearance: Normal appearance. He is normal weight. He is not ill-appearing or toxic-appearing.  HENT:     Head: Normocephalic.     Right Ear: Tympanic membrane, ear canal and external ear normal. There is no impacted cerumen.     Left Ear: Tympanic membrane, ear canal and external ear normal. There is no impacted cerumen.     Nose: Nose normal. No congestion.     Mouth/Throat:     Mouth: Mucous membranes are moist.     Pharynx: Oropharynx is clear. No oropharyngeal exudate or posterior oropharyngeal erythema.  Cardiovascular:     Rate and Rhythm: Normal rate and regular rhythm.     Pulses: Normal pulses.     Heart sounds: Normal heart sounds. No murmur.  Pulmonary:     Effort: Pulmonary effort is normal. No respiratory distress.     Breath sounds: Normal breath sounds. No wheezing or rhonchi.  Chest:     Chest wall: No tenderness.  Neurological:     Mental Status: He is alert and oriented to  person, place, and time.      UC Treatments / Results  Labs (all labs ordered are listed, but only abnormal results are displayed) Labs Reviewed  NOVEL CORONAVIRUS, NAA  CBC WITH DIFFERENTIAL/PLATELET  SEDIMENTATION RATE    EKG   Radiology No results found.  Procedures Procedures (including critical care time)  Medications Ordered in UC Medications - No data to display  Initial Impression / Assessment and Plan / UC Course  I have reviewed the triage vital signs and the nursing notes.  Pertinent labs & imaging results that were available during my care of the patient were reviewed by me and considered in my medical decision making (see chart for details).    Patient stable at discharge. COVID-19 test was ordered and patient was advised to quarantine. Return or go to ED for worsening symptoms   Final Clinical Impressions(s) / UC Diagnoses   Final diagnoses:  Encounter for screening for  COVID-19  Encounter for screening for other bacterial disease  Discitis of thoracic region     Discharge Instructions     COVID testing ordered.  It will take between 2-7 days for test results.  Someone will contact you regarding abnormal results.    In the meantime: You should remain isolated in your home for 10 days from symptom onset AND greater than 24 hours after symptoms resolution (absence of fever without the use of fever-reducing medication and improvement in respiratory symptoms), whichever is longer Get plenty of rest and push fluids  Use medications daily for symptom relief Use OTC medications like ibuprofen or tylenol as needed fever or pain Call or go to the ED if you have any new or worsening symptoms such as fever, worsening cough, shortness of breath, chest tightness, chest pain, turning blue, changes in mental status, etc...     ED Prescriptions    None     PDMP not reviewed this encounter.   Emerson Monte, [place] 03/14/20 1547

## 2020-03-14 NOTE — Discharge Instructions (Addendum)
COVID testing ordered.  It will take between 2-7 days for test results.  Someone will contact you regarding abnormal results.    In the meantime: You should remain isolated in your home for 10 days from symptom onset AND greater than 24 hours after symptoms resolution (absence of fever without the use of fever-reducing medication and improvement in respiratory symptoms), whichever is longer Get plenty of rest and push fluids Use medications daily for symptom relief Use OTC medications like ibuprofen or tylenol as needed fever or pain Call or go to the ED if you have any new or worsening symptoms such as fever, worsening cough, shortness of breath, chest tightness, chest pain, turning blue, changes in mental status, etc 

## 2020-03-14 NOTE — ED Triage Notes (Signed)
Pt needs covid test for dental work

## 2020-03-15 LAB — CBC WITH DIFFERENTIAL/PLATELET
Basophils Absolute: 0.1 10*3/uL (ref 0.0–0.2)
Basos: 1 %
EOS (ABSOLUTE): 0.3 10*3/uL (ref 0.0–0.4)
Eos: 4 %
Hematocrit: 42.7 % (ref 37.5–51.0)
Hemoglobin: 14.2 g/dL (ref 13.0–17.7)
Immature Grans (Abs): 0 10*3/uL (ref 0.0–0.1)
Immature Granulocytes: 0 %
Lymphocytes Absolute: 1.4 10*3/uL (ref 0.7–3.1)
Lymphs: 22 %
MCH: 28.1 pg (ref 26.6–33.0)
MCHC: 33.3 g/dL (ref 31.5–35.7)
MCV: 84 fL (ref 79–97)
Monocytes Absolute: 0.5 10*3/uL (ref 0.1–0.9)
Monocytes: 9 %
Neutrophils Absolute: 4 10*3/uL (ref 1.4–7.0)
Neutrophils: 64 %
Platelets: 340 10*3/uL (ref 150–450)
RBC: 5.06 x10E6/uL (ref 4.14–5.80)
RDW: 14 % (ref 11.6–15.4)
WBC: 6.2 10*3/uL (ref 3.4–10.8)

## 2020-03-15 LAB — SARS-COV-2, NAA 2 DAY TAT

## 2020-03-15 LAB — NOVEL CORONAVIRUS, NAA: SARS-CoV-2, NAA: NOT DETECTED

## 2020-03-15 LAB — SEDIMENTATION RATE: Sed Rate: 43 mm/hr — ABNORMAL HIGH (ref 0–30)

## 2020-04-08 ENCOUNTER — Other Ambulatory Visit: Payer: Self-pay

## 2020-04-08 ENCOUNTER — Encounter: Payer: Self-pay | Admitting: Internal Medicine

## 2020-04-08 ENCOUNTER — Ambulatory Visit (INDEPENDENT_AMBULATORY_CARE_PROVIDER_SITE_OTHER): Payer: Medicare Other | Admitting: Internal Medicine

## 2020-04-08 DIAGNOSIS — T85733D Infection and inflammatory reaction due to implanted electronic neurostimulator of spinal cord, electrode (lead), subsequent encounter: Secondary | ICD-10-CM | POA: Diagnosis not present

## 2020-04-08 DIAGNOSIS — I2510 Atherosclerotic heart disease of native coronary artery without angina pectoris: Secondary | ICD-10-CM

## 2020-04-08 NOTE — Progress Notes (Signed)
[place] for Infectious Disease  Patient Active Problem List   Diagnosis Date Noted  . Infection of spinal cord stimulator ([place]) 02/26/2020    Priority: High  . Discitis of thoracic region     Priority: High  . Bacteremia due to methicillin susceptible Staphylococcus aureus (MSSA) 03/18/2018    Priority: High  . Pseudogout of knee   . Pyogenic arthritis of right knee joint ([place])   . Falls frequently 05/08/2018  . Fall   . Sleep disturbance   . Post-op pain   . Hypokalemia   . Morbid obesity ([place])   . Postoperative pain   . DVT, lower extremity, distal, acute, bilateral ([place])   . Acute blood loss anemia   . Anemia of chronic disease   . Essential hypertension   . Bacteremia   . Myelopathy ([place]) 03/30/2018  . Paraplegia ([place])   . Postlaminectomy syndrome, lumbar region   . Epidural abscess   . Abdominal distension   . Encephalopathy   . Weakness of both lower extremities   . Spinal cord stimulator status   . Staphylococcus aureus sepsis ([place]) 03/20/2018  . Obesity 03/18/2018  . Nephrolithiasis 03/16/2018  . AKI (acute kidney injury) ([place]) 03/16/2018  . Cognitive impairment 03/16/2018  . Chronic pain syndrome 03/16/2018  . Cervicogenic headache 01/28/2016  . B12 deficiency 01/28/2016  . Memory difficulty 07/23/2015  . History of cerebrovascular disease 07/23/2015  . FH: colon cancer 08/13/2014  . Hx of adenomatous colonic polyps 08/13/2014  . Chronic back pain 05/30/2012  . CAD, NATIVE VESSEL 07/17/2010  . HYPERCHOLESTEROLEMIA 10/31/2009  . SMOKELESS TOBACCO ABUSE 10/31/2009  . Obstructive sleep apnea 10/31/2009  . Essential hypertension, benign 10/31/2009  . GERD 10/31/2009    Patient's Medications  New Prescriptions   No medications on file  Previous Medications   ALFUZOSIN (UROXATRAL) 10 MG 24 HR TABLET    Take 10 mg by mouth daily with breakfast.   AMLODIPINE (NORVASC) 10 MG TABLET    Take 1 tablet (10 mg total) by mouth daily.   ASPIRIN  81 MG TABLET    Take 1 tablet (81 mg total) by mouth daily.   BACLOFEN (LIORESAL) 10 MG TABLET    TAKE ONE TABLET IN THE MORNING AND MIDDAY, THEN TWO TABLETS AT NIGHT   CYCLOSPORINE (RESTASIS) 0.05 % OPHTHALMIC EMULSION    1 DROP INTO BOTH EYES TWICE A DAY FOR DRY EYES   DOCUSATE SODIUM (COLACE) 100 MG CAPSULE    Take 2 capsules (200 mg total) by mouth 2 (two) times daily.   DONEPEZIL (ARICEPT) 10 MG TABLET    Take 1 tablet (10 mg total) by mouth at bedtime.   FINASTERIDE (PROSCAR) 5 MG TABLET    Take 1 tablet (5 mg total) by mouth daily. Reported on 05/04/2016   FLUTICASONE (FLONASE) 50 MCG/ACT NASAL SPRAY    Place 1 spray into both nostrils daily as needed for allergies.   GABAPENTIN (NEURONTIN) 400 MG CAPSULE    Take 1 capsule (400 mg total) by mouth 3 (three) times daily.   LATANOPROST (XALATAN) 0.005 % OPHTHALMIC SOLUTION    Place 1 drop into both eyes at bedtime.   LOSARTAN (COZAAR) 100 MG TABLET    Take 100 mg by mouth daily.   MELATONIN 10 MG TABS    Take 10 mg by mouth at bedtime.   MEMANTINE (NAMENDA) 10 MG TABLET    Take 1 tablet (10 mg total) by mouth 2 (two)  times daily.   OMEPRAZOLE (PRILOSEC) 20 MG CAPSULE    Take 2 capsules (40 mg total) by mouth daily.   OXYCODONE HCL 10 MG TABS    Take 1 tablet (10 mg total) by mouth every 4 (four) hours as needed (moderate to severe pain).   POLYETHYLENE GLYCOL (MIRALAX / GLYCOLAX) PACKET    Take 17 g by mouth daily.   POTASSIUM CHLORIDE SA (K-DUR,KLOR-CON) 20 MEQ TABLET    Take 1 tablet (20 mEq total) by mouth daily.   PRAVASTATIN (PRAVACHOL) 10 MG TABLET    Take 1 tablet (10 mg total) by mouth every evening.   TRAZODONE (DESYREL) 100 MG TABLET    Take 1 tablet (100 mg total) by mouth at bedtime.   VENLAFAXINE XR (EFFEXOR-XR) 150 MG 24 HR CAPSULE    Take 1 capsule (150 mg total) by mouth 2 (two) times daily.  Modified Medications   No medications on file  Discontinued Medications   CEPHALEXIN (KEFLEX) 250 MG CAPSULE    Take 1 capsule (250 mg  total) by mouth 4 (four) times daily.    Subjective: Cody Velez is in for his routine follow-up visit.  He had his spinal cord stimulator removed 6 weeks ago after he developed a pocket infection.  Operative cultures grew MSSA.  He has now completed 6 weeks of postop cephalexin.  He developed an intermittent burning pain in his mid back after the stimulator was removed.  He is not having any fever, chills, sweats, nausea, vomiting or diarrhea.  Review of Systems: Review of Systems  Constitutional: Negative for chills, diaphoresis and fever.  Gastrointestinal: Negative for abdominal pain, diarrhea, nausea and vomiting.  Musculoskeletal: Positive for back pain.  Skin: Negative for rash.    Past Medical History:  Diagnosis Date  . Allergic rhinitis   . Arthritis   . B12 deficiency   . Cervicogenic headache 01/28/2016   no current problems per patient 02/22/20  . Childhood asthma    as a child, no problems as an adult  . Chronic back pain   . Coronary atherosclerosis of native coronary artery    Mild nonobstructive CAD at catheterization January 2015  . Dementia ([place])   . Depression   . Essential hypertension   . Falls   . GERD (gastroesophageal reflux disease)   . Glaucoma   . History of blood transfusion    with a surgery procedure  . History of cerebrovascular disease 07/23/2015  . History of kidney stones    surgery to removed  . History of pneumonia 02/2011  . Hyperlipidemia   . Memory difficulty 07/23/2015  . OSA (obstructive sleep apnea)    CPAP - Dr. [name] - uses cpap every night  . Pneumonia   . Prostate cancer ([place])   . PTSD (post-traumatic stress disorder)    [place]  . Rectal bleeding   . Wheelchair bound     Social History   Tobacco Use  . Smoking status: Former Smoker    Types: Cigarettes    Quit date: 12/20/1958    Years since quitting: 61.3  . Smokeless tobacco: Current User    Types: Chew  Substance Use Topics  . Alcohol use: Not Currently    Alcohol/week:  0.0 standard drinks    Comment: couple bottles of wine per month  . Drug use: No    Types: Marijuana    Comment: "last used marijuana ~ 1969"    Family History  Problem Relation Age of Onset  . Emphysema  Father   . Heart failure Father   . Lung cancer Father   . CAD Father   . Colon cancer Mother   . Stroke Mother   . Breast cancer Mother   . Stroke Sister   . Heart attack Brother   . Dementia Paternal Uncle   . Emphysema Maternal Grandmother   . Stroke Maternal Grandmother   . Asthma Other        grandson  . Heart disease Paternal Grandfather   . Anesthesia problems Neg Hx   . Hypotension Neg Hx   . Malignant hyperthermia Neg Hx   . Pseudochol deficiency Neg Hx     No Known Allergies  Objective: Vitals:   04/08/20 1406  BP: (!) 141/71  Pulse: 70  Temp: 98.3 F (36.8 C)  SpO2: (!) 70%  Weight: (!) 306 lb (138.8 kg)   Body mass index is 38.25 kg/m.  Physical Exam Constitutional:      Comments: He is seated in his electric wheelchair.  He is in good spirits.  Musculoskeletal:     Comments: All surgical incisions are healed.  The allergic dermatitis around his pocket incision has resolved.     Lab Results    Problem List Items Addressed This Visit      High   Infection of spinal cord stimulator ([place])    I suspect his pocket infection has been cured following removal of the spinal cord stimulator and 6 weeks of oral cephalexin.  He will stop cephalexin now and follow-up in 6 weeks.          Michel Bickers, MD [facility] [phone] pager   [phone] cell 04/08/2020, 2:16 PM

## 2020-04-08 NOTE — Assessment & Plan Note (Signed)
I suspect his pocket infection has been cured following removal of the spinal cord stimulator and 6 weeks of oral cephalexin.  He will stop cephalexin now and follow-up in 6 weeks.

## 2020-04-09 ENCOUNTER — Ambulatory Visit (HOSPITAL_COMMUNITY): Payer: Medicare Other | Attending: Family | Admitting: Physical Therapy

## 2020-04-09 DIAGNOSIS — M6281 Muscle weakness (generalized): Secondary | ICD-10-CM

## 2020-04-09 DIAGNOSIS — R262 Difficulty in walking, not elsewhere classified: Secondary | ICD-10-CM | POA: Diagnosis not present

## 2020-04-09 DIAGNOSIS — G8929 Other chronic pain: Secondary | ICD-10-CM | POA: Diagnosis not present

## 2020-04-09 DIAGNOSIS — M545 Low back pain, unspecified: Secondary | ICD-10-CM

## 2020-04-10 ENCOUNTER — Encounter (HOSPITAL_COMMUNITY): Payer: Self-pay | Admitting: Physical Therapy

## 2020-04-10 NOTE — Therapy (Addendum)
[place], [place], [address] Phone: [phone]   Fax:  [phone]  Physical Therapy Evaluation  Patient Details  Name: Cody Velez MRN: [id number] Date of Birth: 1946-05-03 Referring Provider (PT): Dorothea Ogle   Encounter Date: 04/09/2020  PT End of Session - 04/09/20 1404    Visit Number  1    Number of Visits  12    Date for PT Re-Evaluation  05/21/20   Eval 4/21   Authorization Type  Primary Medicare, Secondary BCBS, [facility]. 15 VL with VA champ and 75 VL for BCBS no auth needed for [employer], [employer] covered    Authorization - Visit Number  1    Authorization - Number of Visits  15    Progress Note Due on Visit  10    PT Start Time  [id number]    PT Stop Time  1445    PT Time Calculation (min)  40 min    Equipment Utilized During Treatment  Gait belt    Activity Tolerance  Patient tolerated treatment well;No increased pain    Behavior During Therapy  WFL for tasks assessed/performed       Past Medical History:  Diagnosis Date  . Allergic rhinitis   . Arthritis   . B12 deficiency   . Cervicogenic headache 01/28/2016   no current problems per patient 02/22/20  . Childhood asthma    as a child, no problems as an adult  . Chronic back pain   . Coronary atherosclerosis of native coronary artery    Mild nonobstructive CAD at catheterization January 2015  . Dementia ([place])   . Depression   . Essential hypertension   . Falls   . GERD (gastroesophageal reflux disease)   . Glaucoma   . History of blood transfusion    with a surgery procedure  . History of cerebrovascular disease 07/23/2015  . History of kidney stones    surgery to removed  . History of pneumonia 02/2011  . Hyperlipidemia   . Memory difficulty 07/23/2015  . OSA (obstructive sleep apnea)    CPAP - Dr. [name] - uses cpap every night  . Pneumonia   . Prostate cancer ([place])   . PTSD (post-traumatic stress disorder)    [place]  .  Rectal bleeding   . Wheelchair bound     Past Surgical History:  Procedure Laterality Date  . APPLICATION OF ROBOTIC ASSISTANCE FOR SPINAL PROCEDURE N/A 03/28/2018   Procedure: APPLICATION OF ROBOTIC ASSISTANCE FOR SPINAL PROCEDURE;  Surgeon: Kristeen Miss, MD;  Location: [place];  Service: Neurosurgery;  Laterality: N/A;  . BACK SURGERY  02/14/12   lumbar OR #7; "today redid L1L2; replaced screws; added bone from hip"  . BILATERAL KNEE ARTHROSCOPY    . COLONOSCOPY  10/15/2008   Dr. [name]: tubular adenoma   . COLONOSCOPY  12/17/2003   [id number] rectal and colon  . COLONOSCOPY N/A 09/05/2014   Procedure: COLONOSCOPY;  Surgeon: Daneil Dolin, MD;  [facility];  Service: Endoscopy;  Laterality: N/A;  7:30-rescheduled 9/17 to [place] notified pt  . CYSTOSCOPY WITH STENT PLACEMENT Right 01/27/2016   Procedure: CYSTOSCOPY WITH STENT PLACEMENT;  Surgeon: [name], MD;  [facility];  Service: Urology;  Laterality: Right;  . CYSTOSCOPY/RETROGRADE/URETEROSCOPY/STONE EXTRACTION WITH BASKET Right 01/27/2016   Procedure: CYSTOSCOPY, RIGHT RETROGRADE, RIGHT URETEROSCOPY, STONE EXTRACTION ;  Surgeon: [name], MD;  [facility];  Service: Urology;  Laterality: Right;  .  ESOPHAGOGASTRODUODENOSCOPY  10/15/2008     Dr Rourk:Schatzki's ring status post dilation and disruption via 33 F Maloney dilator/ otherwise unremarkable esophagus, small hiatal hernia, multiple fundal gland polyps not manipulated, gastritis, negative H.pylori  . ESOPHAGOGASTRODUODENOSCOPY  06/21/02   [id number] sliding hiatal hernia with mild changes of reflux esophagitis limited to gastroesophageal junction.  Noncritical ring at distal esophagus, 3 cm proximal to gastroesophageal junction/Antral gastritis  . [place]   "broke face playing softball"  . FRACTURE SURGERY     "left wrist; broke it; took spur off"  . HOLMIUM LASER APPLICATION Right [id number]   Procedure: HOLMIUM  LASER APPLICATION;  Surgeon: [name], MD;  [facility]: [facility];  Service: Urology;  Laterality: Right;  . KNEE ARTHROSCOPY Right 05/18/2018   Procedure: PARTIAL MEDIAL MENISECTOMY AND SURGICAL LAVAGE AND CHONDROPLASTY;  Surgeon: [name], MD;  Location: [place];  Service: Orthopedics;  Laterality: Right;  . LEFT HEART CATHETERIZATION WITH CORONARY ANGIOGRAM N/A 12/27/2013   Procedure: LEFT HEART CATHETERIZATION WITH CORONARY ANGIOGRAM;  Surgeon: [name], MD;  Location: [facility];  Service: Cardiovascular;  Laterality: N/A;  . neck epidural    . POSTERIOR LUMBAR FUSION 4 LEVEL N/A 03/28/2018   Procedure: Thoracic eight -Lumbar two- FIXATION WITH SCREW PLACEMENT, DECOMPRESSION Thoracic ten-Thoracic eleven  FOR OSTEOMYELITIS;  Surgeon: Kristeen Miss, MD;  Location: [place];  Service: Neurosurgery;  Laterality: N/A;  . SHOULDER SURGERY Bilateral   . SPINAL CORD STIMULATOR REMOVAL N/A 02/25/2020   Procedure: LUMBAR SPINAL CORD STIMULATOR REMOVAL;  Surgeon: Kristeen Miss, MD;  Location: [place];  Service: Neurosurgery;  Laterality: N/A;  . TEE WITHOUT CARDIOVERSION N/A 03/21/2018   Procedure: TRANSESOPHAGEAL ECHOCARDIOGRAM (TEE) WITH PROPOFOL;  Surgeon: [name], MD;  [facility];  Service: Cardiovascular;  Laterality: N/A;    There were no vitals filed for this visit.   Subjective Assessment - 04/10/20 0814    Subjective  Patient complains of chronic back pain. States that he has had multiple lumbar and cervical fusions and has had a recent surgery with removal of his neurostimulator secondary to chronic infection. States that he was doing very well, exercising a lot and going to the [facility] but then COVID shut everything down and he got out of his routine. States he has a pool at home and that will be opened for the summer that he can use. States he has not been walking much and has primarily used his wheelchair. Reports he recently got a new adaptable car from the [place] that he  is able to drive.    Limitations  Standing;Walking;House hold activities    Currently in Pain?  Yes    Pain Location  Back    Pain Orientation  Lower;Upper    Pain Descriptors / Indicators  Aching    Pain Type  Chronic pain    Pain Onset  More than a month ago    Pain Frequency  Constant    Aggravating Factors   movement    Pain Relieving Factors  rest         [facility] PT Assessment - 04/10/20 0001      Assessment   Medical Diagnosis  LBP    Referring Provider (PT)  Dorothea Ogle    Prior Therapy  yes for gait training      Precautions   Precautions  Fall      Restrictions   Weight Bearing Restrictions  No      Balance  Screen   Has the patient fallen in the past 6 months  No    Has the patient had a decrease in activity level because of a fear of falling?   Yes    Is the patient reluctant to leave their home because of a fear of falling?   No      Home Environment   Living Environment  Private residence    Living Arrangements  Spouse/significant other    Available Help at Discharge  Family   live with girlfriend   Type of [place] entrance    [place] to live on main level with bedroom/bathroom    [place] - 2 wheels;[place] - 4 wheels;Wheelchair - manual;Bedside commode;Electric scooter      Prior Function   Level of Independence  Needs assistance with gait;Independent with homemaking with wheelchair;Independent with transfers      Cognition   Overall Cognitive Status  Within Functional Limits for tasks assessed      Observation/Other Assessments   Focus on Therapeutic Outcomes (FOTO)   55% limited      Flexibility   Soft Tissue Assessment /Muscle Length  yes   limitation in hip flexors, quads and adductors noted     Transfers   Transfers  Sit to Stand;Stand to Sit;Supine to Sit;Sit to Supine;Lateral/Scoot Transfers    Sit to Stand  6: Modified independent (Device/Increase time);With upper extremity assist;From bed    requires UE to stay standing   Stand to Sit  6: Modified independent (Device/Increase time);Uncontrolled descent    Lateral/Scoot Transfers  6: Modified independent (Device/Increase time)    Supine to Sit  6: Modified independent (Device/Increase time);With upper extremity assist;Multiple attempts    Sit to Supine  6: Modified independent (Device/Increase time);Uncontrolled descent;With upper extremity assist    Comments  Standing, able to stand with B UE assist for 60 seconds- unable to fully straighten knees/hips      Administrator, arts  Both upper extremities    Wheelchair Parts Management  Independent                Objective measurements completed on examination: See above findings.      [facility] Adult PT Treatment/Exercise - 04/10/20 0001      Exercises   Exercises  Knee/Hip      Knee/Hip Exercises: Supine   Bridges  AROM;Strengthening;3 sets;5 reps   cramping initially - resolved with repetition, 2" hold            PT Education - 04/10/20 0818    Education Details  on current condition, plan moving forward and importance of increased physical activity at home.    Person(s) Educated  Patient    Methods  Explanation    Comprehension  Verbalized understanding       PT Short Term Goals - 04/10/20 0756      PT SHORT TERM GOAL #1   Title  Patient will be independent in self management strategies to improve quality of life and functional outcomes.    Time  3    Period  Weeks    Status  New    Target Date  05/01/20      PT SHORT TERM GOAL #2   Title  Patient will report at least 25% improvement in overall symptoms and/or functional ability.    Time  3  Period  Weeks    Status  New    Target Date  05/01/20      PT SHORT TERM GOAL #3   Title  Patient will report or demonstrate ability to perform ambulation for 2 minutes with LRAD with no more than supervision assistance without rest break and  reported no more than minimal difficulty.     Time  3    Period  Weeks    Status  New    Target Date  05/01/20        PT Long Term Goals - 04/10/20 0756      PT LONG TERM GOAL #1   Title  Patient will report at least 50% improvement in overall symptoms and/or functional ability.    Time  6    Period  Weeks    Status  New    Target Date  05/22/20      PT LONG TERM GOAL #2   Title  Patient will demonstrate ability to stand at a support surface for 10 minutes without a rest break in order to perform household chores and ADL's with increased ease.     Time  6    Period  Weeks    Status  New    Target Date  05/22/20      PT LONG TERM GOAL #3   Title  Patient will demonstrate ability to ambulate 120 feet without rest break using LRAD with no more than minimal assistance.    Time  6    Period  Weeks    Status  New    Target Date  05/22/20             Plan - 04/10/20 0753    Clinical Impression Statement  Patient known to physical therapy for previous treatment of muscle weakness and gait training. He has recently gotten out of routine secondary to [place] shutting down [employer]. Unable to assess walking ability secondary to time limitations on this date, will assess next session, anticipate patient requiring min to mod assistance with wheelchair follow secondary to lack of walking over the last year and previous ambulatory status. Patient presents with functional weakness and ROM limitations and would greatly benefit from skilled physical therapy to improve functional mobility at home and in the community.    Personal Factors and Comorbidities  Comorbidity 1;Comorbidity 2;Comorbidity 3+    Comorbidities  multiple lumbar and cervical fusions, paraplegic    Examination-Activity Limitations  Stand;Stairs;Locomotion Level    Examination-Participation Restrictions  Meal Prep;Cleaning;Community Activity    Stability/Clinical Decision Making  Stable/Uncomplicated    Clinical Decision  Making  Low    Rehab Potential  Good    PT Frequency  2x / week    PT Duration  6 weeks    PT Treatment/Interventions  ADLs/Self Care Home Management;Aquatic Therapy;Cryotherapy;Electrical Stimulation;Moist Heat;Balance training;Therapeutic exercise;Therapeutic activities;Functional mobility training;Stair training;Gait training;DME Instruction;Neuromuscular re-education;Patient/family education;Wheelchair mobility training;Manual techniques;Dry needling;Passive range of motion;Joint Manipulations    PT Next Visit Plan  2MW test, balance screen, give aquatic consent information. LE strengthening and walking- WC follow, work towards prone lying (pillows under abdomen to start)    PT Home Exercise Plan  4/22 bridges    Consulted and Agree with Plan of Care  Patient       Patient will benefit from skilled therapeutic intervention in order to improve the following deficits and impairments:  Abnormal gait, Decreased endurance, Impaired sensation, Pain, Decreased strength, Decreased activity tolerance, Decreased balance, Decreased mobility, Difficulty walking,  Decreased range of motion, Postural dysfunction, Improper body mechanics  Visit Diagnosis: Difficulty in walking, not elsewhere classified  Muscle weakness (generalized)  Chronic midline low back pain without sciatica     Problem List Patient Active Problem List   Diagnosis Date Noted  . Infection of spinal cord stimulator ([place]) 02/26/2020  . Pseudogout of knee   . Pyogenic arthritis of right knee joint ([place])   . Falls frequently 05/08/2018  . Fall   . Sleep disturbance   . Post-op pain   . Hypokalemia   . Morbid obesity ([place])   . Postoperative pain   . DVT, lower extremity, distal, acute, bilateral ([place])   . Acute blood loss anemia   . Anemia of chronic disease   . Essential hypertension   . Bacteremia   . Myelopathy ([place]) 03/30/2018  . Paraplegia ([place])   . Postlaminectomy syndrome, lumbar region   . Epidural abscess   .  Abdominal distension   . Encephalopathy   . Weakness of both lower extremities   . Discitis of thoracic region   . Spinal cord stimulator status   . Staphylococcus aureus sepsis ([place]) 03/20/2018  . Bacteremia due to methicillin susceptible Staphylococcus aureus (MSSA) 03/18/2018  . Obesity 03/18/2018  . Nephrolithiasis 03/16/2018  . AKI (acute kidney injury) ([place]) 03/16/2018  . Cognitive impairment 03/16/2018  . Chronic pain syndrome 03/16/2018  . Cervicogenic headache 01/28/2016  . B12 deficiency 01/28/2016  . Memory difficulty 07/23/2015  . History of cerebrovascular disease 07/23/2015  . FH: colon cancer 08/13/2014  . Hx of adenomatous colonic polyps 08/13/2014  . Chronic back pain 05/30/2012  . CAD, NATIVE VESSEL 07/17/2010  . HYPERCHOLESTEROLEMIA 10/31/2009  . SMOKELESS TOBACCO ABUSE 10/31/2009  . Obstructive sleep apnea 10/31/2009  . Essential hypertension, benign 10/31/2009  . GERD 10/31/2009    10:06 AM, 04/10/20 Jerene Pitch, DPT Physical Therapy with [facility]  [phone] office  [place] [address] [place], [place], [address] Phone: [phone]   Fax:  [phone]  Name: Cody Velez MRN: [id number] Date of Birth: 1946/11/24

## 2020-04-15 ENCOUNTER — Ambulatory Visit: Payer: Medicare Other | Admitting: Internal Medicine

## 2020-04-22 ENCOUNTER — Other Ambulatory Visit: Payer: Self-pay

## 2020-04-22 ENCOUNTER — Ambulatory Visit (HOSPITAL_COMMUNITY): Payer: Medicare Other | Attending: Family | Admitting: Physical Therapy

## 2020-04-22 DIAGNOSIS — R2689 Other abnormalities of gait and mobility: Secondary | ICD-10-CM | POA: Diagnosis not present

## 2020-04-22 DIAGNOSIS — R29898 Other symptoms and signs involving the musculoskeletal system: Secondary | ICD-10-CM | POA: Diagnosis not present

## 2020-04-22 DIAGNOSIS — M6281 Muscle weakness (generalized): Secondary | ICD-10-CM | POA: Insufficient documentation

## 2020-04-22 DIAGNOSIS — R262 Difficulty in walking, not elsewhere classified: Secondary | ICD-10-CM | POA: Insufficient documentation

## 2020-04-22 DIAGNOSIS — M545 Low back pain, unspecified: Secondary | ICD-10-CM

## 2020-04-22 DIAGNOSIS — G8929 Other chronic pain: Secondary | ICD-10-CM | POA: Diagnosis not present

## 2020-04-22 NOTE — Therapy (Signed)
[place], [place], [address] Phone: [phone]   Fax:  [phone]  Physical Therapy Treatment  Patient Details  Name: Cody Velez MRN: [id number] Date of Birth: [birth year] Referring Provider (PT): Dorothea Ogle   Encounter Date: 04/22/2020  PT End of Session - 04/22/20 1402    Visit Number  2    Number of Visits  12    Date for PT Re-Evaluation  05/21/20   Eval 4/21   Authorization Type  Primary Medicare, Secondary BCBS, [facility] [place]. 15 VL with VA champ and 75 VL for BCBS no auth needed for [employer], [employer] covered    Authorization - Visit Number  2    Authorization - Number of Visits  15    Progress Note Due on Visit  10    PT Start Time  1315    PT Stop Time  1400    PT Time Calculation (min)  45 min    Equipment Utilized During Treatment  Gait belt    Activity Tolerance  Patient tolerated treatment well;No increased pain    Behavior During Therapy  WFL for tasks assessed/performed       Past Medical History:  Diagnosis Date  . Allergic rhinitis   . Arthritis   . B12 deficiency   . Cervicogenic headache 01/28/2016   no current problems per patient 02/22/20  . Childhood asthma    as a child, no problems as an adult  . Chronic back pain   . Coronary atherosclerosis of native coronary artery    Mild nonobstructive CAD at catheterization January 2015  . Dementia ([place])   . Depression   . Essential hypertension   . Falls   . GERD (gastroesophageal reflux disease)   . Glaucoma   . History of blood transfusion    with a surgery procedure  . History of cerebrovascular disease 07/23/2015  . History of kidney stones    surgery to removed  . History of pneumonia 02/2011  . Hyperlipidemia   . Memory difficulty 07/23/2015  . OSA (obstructive sleep apnea)    CPAP - Dr. [name] - uses cpap every night  . Pneumonia   . Prostate cancer ([place])   . PTSD (post-traumatic stress disorder)    [place]  . Rectal  bleeding   . Wheelchair bound     Past Surgical History:  Procedure Laterality Date  . APPLICATION OF ROBOTIC ASSISTANCE FOR SPINAL PROCEDURE N/A 03/28/2018   Procedure: APPLICATION OF ROBOTIC ASSISTANCE FOR SPINAL PROCEDURE;  Surgeon: Kristeen Miss, MD;  Location: [facility];  Service: Neurosurgery;  Laterality: N/A;  . BACK SURGERY  02/14/12   lumbar OR #7; "today redid L1L2; replaced screws; added bone from hip"  . BILATERAL KNEE ARTHROSCOPY    . COLONOSCOPY  10/15/2008   Dr. [name]: tubular adenoma   . COLONOSCOPY  12/17/2003   KPT:WSFKCL rectal and colon  . COLONOSCOPY N/A 09/05/2014   Procedure: COLONOSCOPY;  Surgeon: Daneil Dolin, MD;  [facility];  Service: Endoscopy;  Laterality: N/A;  7:30-rescheduled 9/17 to [place] notified pt  . CYSTOSCOPY WITH STENT PLACEMENT Right 01/27/2016   Procedure: CYSTOSCOPY WITH STENT PLACEMENT;  Surgeon: [name], MD;  [facility];  Service: Urology;  Laterality: Right;  . CYSTOSCOPY/RETROGRADE/URETEROSCOPY/STONE EXTRACTION WITH BASKET Right 01/27/2016   Procedure: CYSTOSCOPY, RIGHT RETROGRADE, RIGHT URETEROSCOPY, STONE EXTRACTION ;  Surgeon: [name], MD;  Location[facility];  Service: Urology;  Laterality: Right;  .  ESOPHAGOGASTRODUODENOSCOPY  10/15/2008     Dr Rourk:Schatzki's ring status post dilation and disruption via 83 F [address]oney dilator/ otherwise unremarkable esophagus, small hiatal hernia, multiple fundal gland polyps not manipulated, gastritis, negative H.pylori  . ESOPHAGOGASTRODUODENOSCOPY  06/21/02   IOM:BTDHR sliding hiatal hernia with mild changes of reflux esophagitis limited to gastroesophageal junction.  Noncritical ring at distal esophagus, 3 cm proximal to gastroesophageal junction/Antral gastritis  . [place]   "broke face playing softball"  . FRACTURE SURGERY     "left wrist; broke it; took spur off"  . HOLMIUM LASER APPLICATION Right 03/21/6383   Procedure: HOLMIUM LASER  APPLICATION;  Surgeon: [name], MD;  [facility];  Service: Urology;  Laterality: Right;  . KNEE ARTHROSCOPY Right 05/18/2018   Procedure: PARTIAL MEDIAL MENISECTOMY AND SURGICAL LAVAGE AND CHONDROPLASTY;  Surgeon: [name], MD;  Location: [facility];  Service: Orthopedics;  Laterality: Right;  . LEFT HEART CATHETERIZATION WITH CORONARY ANGIOGRAM N/A 12/27/2013   Procedure: LEFT HEART CATHETERIZATION WITH CORONARY ANGIOGRAM;  Surgeon: [name], MD;  Location: [facility];  Service: Cardiovascular;  Laterality: N/A;  . neck epidural    . POSTERIOR LUMBAR FUSION 4 LEVEL N/A 03/28/2018   Procedure: Thoracic eight -Lumbar two- FIXATION WITH SCREW PLACEMENT, DECOMPRESSION Thoracic ten-Thoracic eleven  FOR OSTEOMYELITIS;  Surgeon: Kristeen Miss, MD;  Location: [facility];  Service: Neurosurgery;  Laterality: N/A;  . SHOULDER SURGERY Bilateral   . SPINAL CORD STIMULATOR REMOVAL N/A 02/25/2020   Procedure: LUMBAR SPINAL CORD STIMULATOR REMOVAL;  Surgeon: Kristeen Miss, MD;  Location: [facility];  Service: Neurosurgery;  Laterality: N/A;  . TEE WITHOUT CARDIOVERSION N/A 03/21/2018   Procedure: TRANSESOPHAGEAL ECHOCARDIOGRAM (TEE) WITH PROPOFOL;  Surgeon: [name], MD;  [facility];  Service: Cardiovascular;  Laterality: N/A;    There were no vitals filed for this visit.  Subjective Assessment - 04/22/20 1323    Subjective  PT states he has not walked in 6 months because he got lazy.  States his back is only hurting a little, 2-3/10, more of an ache or throb.    Currently in Pain?  Yes    Pain Score  3     Pain Location  Back    Pain Orientation  Lower    Pain Descriptors / Indicators  Aching;Throbbing    Pain Type  Chronic pain         [facility] PT Assessment - 04/22/20 1325      Assessment   Medical Diagnosis  LBP    Referring Provider (PT)  Dorothea Ogle    Prior Therapy  yes for gait training      Precautions   Precautions  Fall      Restrictions   Weight Bearing  Restrictions  --      Balance Screen   Has the patient fallen in the past 6 months  No    Has the patient had a decrease in activity level because of a fear of falling?   Yes    Is the patient reluctant to leave their home because of a fear of falling?   No      Home Environment   Living Environment  --    Living Arrangements  --    Available Help at Discharge  --    Type of [place]  --  Prior Function   Level of Independence  Needs assistance with gait;Independent with homemaking with wheelchair;Independent with transfers      Cognition   Overall Cognitive Status  Within Functional Limits for tasks assessed      Observation/Other Assessments   Focus on Therapeutic Outcomes (FOTO)   --      Flexibility   Soft Tissue Assessment /Muscle Length  --      Transfers   Transfers  --    Sit to Stand  --    Stand to Sit  --    Lateral/Scoot Transfers  --    Supine to Sit  --    Sit to Supine  --    Comments  --      Ambulation/Gait   Ambulation/Gait  Yes    Ambulation/Gait Assistance  4: Min assist;3: Mod assist    Ambulation Distance (Feet)  168 Feet    Assistive device  Rolling walker    Ambulation Surface  Level    Gait velocity  0.43 m/sec   2 MWT     Wheelchair Mobility   Wheelchair Mobility  --    Environmental health practitioner  --    Wheelchair Parts Management  --      Oceanographer Test   Sit to Stand  Able to stand  independently using hands    Standing Unsupported  Able to stand 2 minutes with supervision    Sitting with Back Unsupported but Feet Supported on Floor or Stool  Able to sit safely and securely 2 minutes    Stand to Sit  Uses backs of legs against chair to control descent    Transfers  Able to transfer with verbal cueing and /or supervision    Standing Unsupported with Eyes Closed  Able to stand 3 seconds    Standing Unsupported with Feet Together  Able to place feet together independently and  stand for 1 minute with supervision    From Standing, Reach Forward with Outstretched Arm  Can reach forward >12 cm safely (5")    From Standing Position, Pick up Object from Floor  Unable to try/needs assist to keep balance    From Standing Position, Turn to Look Behind Over each Shoulder  Turn sideways only but maintains balance    Turn 360 Degrees  Needs close supervision or verbal cueing    Standing Unsupported, Alternately Place Feet on Step/Stool  Needs assistance to keep from falling or unable to try    Standing Unsupported, One Foot in Front  Needs help to step but can hold 15 seconds    Standing on One Leg  Able to lift leg independently and hold equal to or more than 3 seconds    Total Score  28                           PT Education - 04/22/20 1400    Education Details  reveiwed goals and POC moving forward.    Person(s) Educated  Patient    Methods  Explanation    Comprehension  Verbalized understanding       PT Short Term Goals - 04/22/20 1357      PT SHORT TERM GOAL #1   Title  Patient will be independent in self management strategies to improve quality of life and functional outcomes.    Time  3    Period  Weeks    Status  On-going    Target  Date  05/01/20      PT SHORT TERM GOAL #2   Title  Patient will report at least 25% improvement in overall symptoms and/or functional ability.    Time  3    Period  Weeks    Status  On-going    Target Date  05/01/20      PT SHORT TERM GOAL #3   Title  Patient will report or demonstrate ability to perform ambulation for 2 minutes with LRAD with no more than supervision assistance without rest break and reported no more than minimal difficulty.     Time  3    Period  Weeks    Status  On-going    Target Date  05/01/20        PT Long Term Goals - 04/22/20 1358      PT LONG TERM GOAL #1   Title  Patient will report at least 50% improvement in overall symptoms and/or functional ability.    Time  6     Period  Weeks    Status  On-going      PT LONG TERM GOAL #2   Title  Patient will demonstrate ability to stand at a support surface for 10 minutes without a rest break in order to perform household chores and ADL's with increased ease.     Time  6    Period  Weeks    Status  On-going      PT LONG TERM GOAL #3   Title  Patient will demonstrate ability to ambulate 120 feet without rest break using LRAD with no more than minimal assistance.    Time  6    Period  Weeks    Status  Partially Met            Plan - 04/22/20 1405    Clinical Impression Statement  Began session with 2 MWT in which patient currently ambulates at 0.43 m/sec.  Pt was able to complete walk test with RW and min-mod assist.  BERG test also completed to screen balance.  Pt with overall score of    Personal Factors and Comorbidities  Comorbidity 1;Comorbidity 2;Comorbidity 3+    Comorbidities  multiple lumbar and cervical fusions, paraplegic    Examination-Activity Limitations  Stand;Stairs;Locomotion Level    Examination-Participation Restrictions  Meal Prep;Cleaning;Community Activity    Stability/Clinical Decision Making  Stable/Uncomplicated    Rehab Potential  Good    PT Frequency  2x / week    PT Duration  6 weeks    PT Treatment/Interventions  ADLs/Self Care Home Management;Aquatic Therapy;Cryotherapy;Electrical Stimulation;Moist Heat;Balance training;Therapeutic exercise;Therapeutic activities;Functional mobility training;Stair training;Gait training;DME Instruction;Neuromuscular re-education;Patient/family education;Wheelchair mobility training;Manual techniques;Dry needling;Passive range of motion;Joint Manipulations    PT Next Visit Plan  Next session give aquatic consent information. LE strengthening and walking- WC follow, work towards prone lying (pillows under abdomen to start)    PT Home Exercise Plan  4/22 bridges    Consulted and Agree with Plan of Care  Patient       Patient will benefit from  skilled therapeutic intervention in order to improve the following deficits and impairments:  Abnormal gait, Decreased endurance, Impaired sensation, Pain, Decreased strength, Decreased activity tolerance, Decreased balance, Decreased mobility, Difficulty walking, Decreased range of motion, Postural dysfunction, Improper body mechanics  Visit Diagnosis: Difficulty in walking, not elsewhere classified  Muscle weakness (generalized)  Chronic midline low back pain without sciatica  Other abnormalities of gait and mobility  Other symptoms and signs involving the  musculoskeletal system     Problem List Patient Active Problem List   Diagnosis Date Noted  . Infection of spinal cord stimulator ([place]) 02/26/2020  . Pseudogout of knee   . Pyogenic arthritis of right knee joint ([place])   . Falls frequently 05/08/2018  . Fall   . Sleep disturbance   . Post-op pain   . Hypokalemia   . Morbid obesity ([place])   . Postoperative pain   . DVT, lower extremity, distal, acute, bilateral ([place])   . Acute blood loss anemia   . Anemia of chronic disease   . Essential hypertension   . Bacteremia   . Myelopathy ([place]) 03/30/2018  . Paraplegia ([place])   . Postlaminectomy syndrome, lumbar region   . Epidural abscess   . Abdominal distension   . Encephalopathy   . Weakness of both lower extremities   . Discitis of thoracic region   . Spinal cord stimulator status   . Staphylococcus aureus sepsis ([place]) 03/20/2018  . Bacteremia due to methicillin susceptible Staphylococcus aureus (MSSA) 03/18/2018  . Obesity 03/18/2018  . Nephrolithiasis 03/16/2018  . AKI (acute kidney injury) ([place]) 03/16/2018  . Cognitive impairment 03/16/2018  . Chronic pain syndrome 03/16/2018  . Cervicogenic headache 01/28/2016  . B12 deficiency 01/28/2016  . Memory difficulty 07/23/2015  . History of cerebrovascular disease 07/23/2015  . FH: colon cancer 08/13/2014  . Hx of adenomatous colonic polyps 08/13/2014  . Chronic back  pain 05/30/2012  . CAD, NATIVE VESSEL 07/17/2010  . HYPERCHOLESTEROLEMIA 10/31/2009  . SMOKELESS TOBACCO ABUSE 10/31/2009  . Obstructive sleep apnea 10/31/2009  . Essential hypertension, benign 10/31/2009  . GERD 10/31/2009   Teena Irani, PTA/CLT [phone]  Teena Irani 04/22/2020, 2:25 PM  [facility] [address] [place], [place], [address] Phone: [phone]   Fax:  [phone]  Name: Cody Velez MRN: [id number] Date of Birth: Sep 04, 1946

## 2020-04-24 ENCOUNTER — Other Ambulatory Visit: Payer: Self-pay

## 2020-04-24 ENCOUNTER — Ambulatory Visit (HOSPITAL_COMMUNITY): Payer: Medicare Other | Admitting: Physical Therapy

## 2020-04-24 ENCOUNTER — Encounter (HOSPITAL_COMMUNITY): Payer: Self-pay | Admitting: Physical Therapy

## 2020-04-24 DIAGNOSIS — R2689 Other abnormalities of gait and mobility: Secondary | ICD-10-CM | POA: Diagnosis not present

## 2020-04-24 DIAGNOSIS — R29898 Other symptoms and signs involving the musculoskeletal system: Secondary | ICD-10-CM | POA: Diagnosis not present

## 2020-04-24 DIAGNOSIS — G8929 Other chronic pain: Secondary | ICD-10-CM | POA: Diagnosis not present

## 2020-04-24 DIAGNOSIS — R262 Difficulty in walking, not elsewhere classified: Secondary | ICD-10-CM

## 2020-04-24 DIAGNOSIS — M545 Low back pain, unspecified: Secondary | ICD-10-CM

## 2020-04-24 DIAGNOSIS — M6281 Muscle weakness (generalized): Secondary | ICD-10-CM

## 2020-04-24 NOTE — Therapy (Signed)
[place], [place], [address] Phone: [phone]   Fax:  [phone]  Physical Therapy Treatment  Patient Details  Name: Cody Velez MRN: [id number] Date of Birth: Apr 21, 1946 Referring Provider (PT): Dorothea Ogle   Encounter Date: 04/24/2020  PT End of Session - 04/24/20 1313    Visit Number  3    Number of Visits  12    Date for PT Re-Evaluation  05/21/20   Eval 4/21   Authorization Type  Primary Medicare, Secondary BCBS, [facility] [place]. 15 VL with VA champ and 75 VL for BCBS no auth needed for [employer], [employer] covered    Authorization - Visit Number  3    Authorization - Number of Visits  15    Progress Note Due on Visit  10    PT Start Time  1751    PT Stop Time  1354    PT Time Calculation (min)  40 min    Equipment Utilized During Treatment  Gait belt    Activity Tolerance  Patient tolerated treatment well;No increased pain    Behavior During Therapy  WFL for tasks assessed/performed       Past Medical History:  Diagnosis Date  . Allergic rhinitis   . Arthritis   . B12 deficiency   . Cervicogenic headache 01/28/2016   no current problems per patient 02/22/20  . Childhood asthma    as a child, no problems as an adult  . Chronic back pain   . Coronary atherosclerosis of native coronary artery    Mild nonobstructive CAD at catheterization January 2015  . Dementia ([place])   . Depression   . Essential hypertension   . Falls   . GERD (gastroesophageal reflux disease)   . Glaucoma   . History of blood transfusion    with a surgery procedure  . History of cerebrovascular disease 07/23/2015  . History of kidney stones    surgery to removed  . History of pneumonia 02/2011  . Hyperlipidemia   . Memory difficulty 07/23/2015  . OSA (obstructive sleep apnea)    CPAP - Dr. [name] - uses cpap every night  . Pneumonia   . Prostate cancer ([place])   . PTSD (post-traumatic stress disorder)    [place]  . Rectal  bleeding   . Wheelchair bound     Past Surgical History:  Procedure Laterality Date  . APPLICATION OF ROBOTIC ASSISTANCE FOR SPINAL PROCEDURE N/A 03/28/2018   Procedure: APPLICATION OF ROBOTIC ASSISTANCE FOR SPINAL PROCEDURE;  Surgeon: Kristeen Miss, MD;  Location: [place];  Service: Neurosurgery;  Laterality: N/A;  . BACK SURGERY  02/14/12   lumbar OR #7; "today redid L1L2; replaced screws; added bone from hip"  . BILATERAL KNEE ARTHROSCOPY    . COLONOSCOPY  10/15/2008   Dr. [name]: tubular adenoma   . COLONOSCOPY  12/17/2003   [facility]:ENIDPO rectal and colon  . COLONOSCOPY N/A 09/05/2014   Procedure: COLONOSCOPY;  Surgeon: Daneil Dolin, MD;  [facility];  Service: Endoscopy;  Laterality: N/A;  7:30-rescheduled 9/17 to [place] notified pt  . CYSTOSCOPY WITH STENT PLACEMENT Right 01/27/2016   Procedure: CYSTOSCOPY WITH STENT PLACEMENT;  Surgeon: [name], MD;  [facility];  Service: Urology;  Laterality: Right;  . CYSTOSCOPY/RETROGRADE/URETEROSCOPY/STONE EXTRACTION WITH BASKET Right 01/27/2016   Procedure: CYSTOSCOPY, RIGHT RETROGRADE, RIGHT URETEROSCOPY, STONE EXTRACTION ;  Surgeon: [name], MD;  [facility];  Service: Urology;  Laterality: Right;  .  ESOPHAGOGASTRODUODENOSCOPY  10/15/2008     Dr Rourk:Schatzki's ring status post dilation and disruption via 75 F Maloney dilator/ otherwise unremarkable esophagus, small hiatal hernia, multiple fundal gland polyps not manipulated, gastritis, negative H.pylori  . ESOPHAGOGASTRODUODENOSCOPY  06/21/02   [facility]TK:ZSWFU sliding hiatal hernia with mild changes of reflux esophagitis limited to gastroesophageal junction.  Noncritical ring at distal esophagus, 3 cm proximal to gastroesophageal junction/Antral gastritis  . [place]   "broke face playing softball"  . FRACTURE SURGERY     "left wrist; broke it; took spur off"  . HOLMIUM LASER APPLICATION Right 08/22/2354   Procedure: HOLMIUM LASER  APPLICATION;  Surgeon: [name], MD;  [facility];  Service: Urology;  Laterality: Right;  . KNEE ARTHROSCOPY Right 05/18/2018   Procedure: PARTIAL MEDIAL MENISECTOMY AND SURGICAL LAVAGE AND CHONDROPLASTY;  Surgeon: [name], MD;  Location: [place];  Service: Orthopedics;  Laterality: Right;  . LEFT HEART CATHETERIZATION WITH CORONARY ANGIOGRAM N/A 12/27/2013   Procedure: LEFT HEART CATHETERIZATION WITH CORONARY ANGIOGRAM;  Surgeon: [name], MD;  Location: [facility] CATH LAB;  Service: Cardiovascular;  Laterality: N/A;  . neck epidural    . POSTERIOR LUMBAR FUSION 4 LEVEL N/A 03/28/2018   Procedure: Thoracic eight -Lumbar two- FIXATION WITH SCREW PLACEMENT, DECOMPRESSION Thoracic ten-Thoracic eleven  FOR OSTEOMYELITIS;  Surgeon: Kristeen Miss, MD;  Location: [place];  Service: Neurosurgery;  Laterality: N/A;  . SHOULDER SURGERY Bilateral   . SPINAL CORD STIMULATOR REMOVAL N/A 02/25/2020   Procedure: LUMBAR SPINAL CORD STIMULATOR REMOVAL;  Surgeon: Kristeen Miss, MD;  Location: [place];  Service: Neurosurgery;  Laterality: N/A;  . TEE WITHOUT CARDIOVERSION N/A 03/21/2018   Procedure: TRANSESOPHAGEAL ECHOCARDIOGRAM (TEE) WITH PROPOFOL;  Surgeon: [name], MD;  [facility];  Service: Cardiovascular;  Laterality: N/A;    There were no vitals filed for this visit.  Subjective Assessment - 04/24/20 1321    Subjective  States he is feeling pretty good. Reports no pain. States he had a lot of lower back pain yesterday. States he has not gotten up and walking iin a while         [facility] PT Assessment - 04/24/20 0001      Assessment   Medical Diagnosis  LBP    Referring Provider (PT)  Dorothea Ogle                   [facility] Adult PT Treatment/Exercise - 04/24/20 0001      Ambulation/Gait   Ambulation/Gait  Yes    Ambulation/Gait Assistance  4: Min assist;3: Mod assist    Ambulation Distance (Feet)  226 Feet    Assistive device  Rolling walker    Ambulation  Surface  Level;Indoor    Gait Comments  3 seated rest breaks.       Knee/Hip Exercises: Standing   Hip Flexion  AROM;Stengthening;Both;3 sets;10 reps;Knee bent   in // bars with B UE support    Hip Abduction  Both;3 sets;10 reps;Knee straight   in // bars wugt B UE support   Other Standing Knee Exercises  step taps forward and lateral in // bars 3x10 B     Other Standing Knee Exercises  walking in // bars  x10 laps with B UE support                PT Short Term Goals - 04/22/20 1357      PT SHORT TERM GOAL #1   Title  Patient will be independent in self management strategies to improve quality of life and functional outcomes.    Time  3    Period  Weeks    Status  On-going    Target Date  05/01/20      PT SHORT TERM GOAL #2   Title  Patient will report at least 25% improvement in overall symptoms and/or functional ability.    Time  3    Period  Weeks    Status  On-going    Target Date  05/01/20      PT SHORT TERM GOAL #3   Title  Patient will report or demonstrate ability to perform ambulation for 2 minutes with LRAD with no more than supervision assistance without rest break and reported no more than minimal difficulty.     Time  3    Period  Weeks    Status  On-going    Target Date  05/01/20        PT Long Term Goals - 04/22/20 1358      PT LONG TERM GOAL #1   Title  Patient will report at least 50% improvement in overall symptoms and/or functional ability.    Time  6    Period  Weeks    Status  On-going      PT LONG TERM GOAL #2   Title  Patient will demonstrate ability to stand at a support surface for 10 minutes without a rest break in order to perform household chores and ADL's with increased ease.     Time  6    Period  Weeks    Status  On-going      PT LONG TERM GOAL #3   Title  Patient will demonstrate ability to ambulate 120 feet without rest break using LRAD with no more than minimal assistance.    Time  6    Period  Weeks    Status   Partially Met            Plan - 04/24/20 1314    Clinical Impression Statement  Improved length of walking today. Cued patient to sit and rest in w/c follow when his legs started to scissor (this was after about 100 feet of walking when patient fatigued). Patient demonstrated improved walking mechanics after short rest break. Added exercises in parallel bars and educated patient on importance of increasing overall activity at home.    Personal Factors and Comorbidities  Comorbidity 1;Comorbidity 2;Comorbidity 3+    Comorbidities  multiple lumbar and cervical fusions, paraplegic    Examination-Activity Limitations  Stand;Stairs;Locomotion Level    Examination-Participation Restrictions  Meal Prep;Cleaning;Community Activity    Stability/Clinical Decision Making  Stable/Uncomplicated    Rehab Potential  Good    PT Frequency  2x / week    PT Duration  6 weeks    PT Treatment/Interventions  ADLs/Self Care Home Management;Aquatic Therapy;Cryotherapy;Electrical Stimulation;Moist Heat;Balance training;Therapeutic exercise;Therapeutic activities;Functional mobility training;Stair training;Gait training;DME Instruction;Neuromuscular re-education;Patient/family education;Wheelchair mobility training;Manual techniques;Dry needling;Passive range of motion;Joint Manipulations    PT Next Visit Plan  Next session give aquatic consent information. // exercises.  LE strengthening and walking- WC follow, work towards prone lying (pillows under abdomen to start)    PT Home Exercise Plan  4/22 bridges    Consulted and Agree with Plan of Care  Patient       Patient will benefit from skilled therapeutic intervention in order to improve the following deficits and impairments:  Abnormal gait, Decreased endurance, Impaired sensation,  Pain, Decreased strength, Decreased activity tolerance, Decreased balance, Decreased mobility, Difficulty walking, Decreased range of motion, Postural dysfunction, Improper body  mechanics  Visit Diagnosis: Muscle weakness (generalized)  Difficulty in walking, not elsewhere classified  Chronic midline low back pain without sciatica  Other abnormalities of gait and mobility     Problem List Patient Active Problem List   Diagnosis Date Noted  . Infection of spinal cord stimulator ([place]) 02/26/2020  . Pseudogout of knee   . Pyogenic arthritis of right knee joint ([place])   . Falls frequently 05/08/2018  . Fall   . Sleep disturbance   . Post-op pain   . Hypokalemia   . Morbid obesity ([place])   . Postoperative pain   . DVT, lower extremity, distal, acute, bilateral ([place])   . Acute blood loss anemia   . Anemia of chronic disease   . Essential hypertension   . Bacteremia   . Myelopathy ([place]) 03/30/2018  . Paraplegia ([place])   . Postlaminectomy syndrome, lumbar region   . Epidural abscess   . Abdominal distension   . Encephalopathy   . Weakness of both lower extremities   . Discitis of thoracic region   . Spinal cord stimulator status   . Staphylococcus aureus sepsis ([place]) 03/20/2018  . Bacteremia due to methicillin susceptible Staphylococcus aureus (MSSA) 03/18/2018  . Obesity 03/18/2018  . Nephrolithiasis 03/16/2018  . AKI (acute kidney injury) ([place]) 03/16/2018  . Cognitive impairment 03/16/2018  . Chronic pain syndrome 03/16/2018  . Cervicogenic headache 01/28/2016  . B12 deficiency 01/28/2016  . Memory difficulty 07/23/2015  . History of cerebrovascular disease 07/23/2015  . FH: colon cancer 08/13/2014  . Hx of adenomatous colonic polyps 08/13/2014  . Chronic back pain 05/30/2012  . CAD, NATIVE VESSEL 07/17/2010  . HYPERCHOLESTEROLEMIA 10/31/2009  . SMOKELESS TOBACCO ABUSE 10/31/2009  . Obstructive sleep apnea 10/31/2009  . Essential hypertension, benign 10/31/2009  . GERD 10/31/2009   2:02 PM, 04/24/20 Jerene Pitch, DPT Physical Therapy with [facility]  [phone] office  [place] [address] [place], [place], [address] Phone: [phone]   Fax:  [phone]  Name: Cody Velez MRN: [id number] Date of Birth: 29-Oct-1946

## 2020-04-29 ENCOUNTER — Ambulatory Visit (HOSPITAL_COMMUNITY): Payer: Medicare Other | Admitting: Physical Therapy

## 2020-04-29 ENCOUNTER — Telehealth (HOSPITAL_COMMUNITY): Payer: Self-pay | Admitting: Physical Therapy

## 2020-04-29 NOTE — Telephone Encounter (Signed)
pt cancelled appt for today because he said he is having a bad day

## 2020-05-01 ENCOUNTER — Ambulatory Visit (HOSPITAL_COMMUNITY): Payer: Medicare Other | Admitting: Physical Therapy

## 2020-05-01 DIAGNOSIS — R29898 Other symptoms and signs involving the musculoskeletal system: Secondary | ICD-10-CM | POA: Diagnosis not present

## 2020-05-01 DIAGNOSIS — G8929 Other chronic pain: Secondary | ICD-10-CM

## 2020-05-01 DIAGNOSIS — R2689 Other abnormalities of gait and mobility: Secondary | ICD-10-CM | POA: Diagnosis not present

## 2020-05-01 DIAGNOSIS — R262 Difficulty in walking, not elsewhere classified: Secondary | ICD-10-CM | POA: Diagnosis not present

## 2020-05-01 DIAGNOSIS — M545 Low back pain, unspecified: Secondary | ICD-10-CM

## 2020-05-01 DIAGNOSIS — M6281 Muscle weakness (generalized): Secondary | ICD-10-CM

## 2020-05-02 ENCOUNTER — Other Ambulatory Visit: Payer: Self-pay

## 2020-05-02 ENCOUNTER — Encounter (HOSPITAL_COMMUNITY): Payer: Self-pay | Admitting: Physical Therapy

## 2020-05-02 NOTE — Therapy (Signed)
[place], [place], [address] Phone: [phone]   Fax:  [phone]  Physical Therapy Treatment  Patient Details  Name: Cody Velez MRN: [id number] Date of Birth: [birth year] Referring Provider (PT): Dorothea Ogle   Encounter Date: 05/01/2020  PT End of Session - 05/02/20 0816    Visit Number  4    Number of Visits  12    Date for PT Re-Evaluation  05/21/20   Eval 4/21   Authorization Type  Primary Medicare, Secondary BCBS, [facility]. 15 VL with VA champ and 75 VL for BCBS no auth needed for [employer] covered    Authorization - Visit Number  4    Authorization - Number of Visits  15    Progress Note Due on Visit  10    PT Start Time  1700    PT Stop Time  1740    PT Time Calculation (min)  40 min    Equipment Utilized During Treatment  Gait belt    Activity Tolerance  Patient tolerated treatment well;No increased pain    Behavior During Therapy  WFL for tasks assessed/performed       Past Medical History:  Diagnosis Date  . Allergic rhinitis   . Arthritis   . B12 deficiency   . Cervicogenic headache 01/28/2016   no current problems per patient 02/22/20  . Childhood asthma    as a child, no problems as an adult  . Chronic back pain   . Coronary atherosclerosis of native coronary artery    Mild nonobstructive CAD at catheterization January 2015  . Dementia ([place])   . Depression   . Essential hypertension   . Falls   . GERD (gastroesophageal reflux disease)   . Glaucoma   . History of blood transfusion    with a surgery procedure  . History of cerebrovascular disease 07/23/2015  . History of kidney stones    surgery to removed  . History of pneumonia 02/2011  . Hyperlipidemia   . Memory difficulty 07/23/2015  . OSA (obstructive sleep apnea)    CPAP - Dr. [name] - uses cpap every night  . Pneumonia   . Prostate cancer ([place])   . PTSD (post-traumatic stress disorder)    [place]  . Rectal  bleeding   . Wheelchair bound     Past Surgical History:  Procedure Laterality Date  . APPLICATION OF ROBOTIC ASSISTANCE FOR SPINAL PROCEDURE N/A 03/28/2018   Procedure: APPLICATION OF ROBOTIC ASSISTANCE FOR SPINAL PROCEDURE;  Surgeon: Kristeen Miss, MD;  Location: [place];  Service: Neurosurgery;  Laterality: N/A;  . BACK SURGERY  02/14/12   lumbar OR #7; "today redid L1L2; replaced screws; added bone from hip"  . BILATERAL KNEE ARTHROSCOPY    . COLONOSCOPY  10/15/2008   Dr. [name]: tubular adenoma   . COLONOSCOPY  12/17/2003   VO[facility]:DGUYQI rectal and colon  . COLONOSCOPY N/A 09/05/2014   Procedure: COLONOSCOPY;  Surgeon: Daneil Dolin, MD;  [facility];  Service: Endoscopy;  Laterality: N/A;  7:30-rescheduled 9/17 to [place] notified pt  . CYSTOSCOPY WITH STENT PLACEMENT Right 01/27/2016   Procedure: CYSTOSCOPY WITH STENT PLACEMENT;  Surgeon: [name], MD;  [facility];  Service: Urology;  Laterality: Right;  . CYSTOSCOPY/RETROGRADE/URETEROSCOPY/STONE EXTRACTION WITH BASKET Right 01/27/2016   Procedure: CYSTOSCOPY, RIGHT RETROGRADE, RIGHT URETEROSCOPY, STONE EXTRACTION ;  Surgeon: [name], MD;  [facility];  Service: Urology;  Laterality: Right;  .  ESOPHAGOGASTRODUODENOSCOPY  10/15/2008     Dr Rourk:Schatzki's ring status post dilation and disruption via 60 F Maloney dilator/ otherwise unremarkable esophagus, small hiatal hernia, multiple fundal gland polyps not manipulated, gastritis, negative H.pylori  . ESOPHAGOGASTRODUODENOSCOPY  06/21/02   YKZ:LDJTT sliding hiatal hernia with mild changes of reflux esophagitis limited to gastroesophageal junction.  Noncritical ring at distal esophagus, 3 cm proximal to gastroesophageal junction/Antral gastritis  . [place]   "broke face playing softball"  . FRACTURE SURGERY     "left wrist; broke it; took spur off"  . HOLMIUM LASER APPLICATION Right 0/12/7791   Procedure: HOLMIUM LASER  APPLICATION;  Surgeon: [name], MD;  [facility];  Service: Urology;  Laterality: Right;  . KNEE ARTHROSCOPY Right 05/18/2018   Procedure: PARTIAL MEDIAL MENISECTOMY AND SURGICAL LAVAGE AND CHONDROPLASTY;  Surgeon: [name], MD;  Location: [place];  Service: Orthopedics;  Laterality: Right;  . LEFT HEART CATHETERIZATION WITH CORONARY ANGIOGRAM N/A 12/27/2013   Procedure: LEFT HEART CATHETERIZATION WITH CORONARY ANGIOGRAM;  Surgeon: [name], MD;  Location: [facility] CATH LAB;  Service: Cardiovascular;  Laterality: N/A;  . neck epidural    . POSTERIOR LUMBAR FUSION 4 LEVEL N/A 03/28/2018   Procedure: Thoracic eight -Lumbar two- FIXATION WITH SCREW PLACEMENT, DECOMPRESSION Thoracic ten-Thoracic eleven  FOR OSTEOMYELITIS;  Surgeon: Kristeen Miss, MD;  Location: [place];  Service: Neurosurgery;  Laterality: N/A;  . SHOULDER SURGERY Bilateral   . SPINAL CORD STIMULATOR REMOVAL N/A 02/25/2020   Procedure: LUMBAR SPINAL CORD STIMULATOR REMOVAL;  Surgeon: Kristeen Miss, MD;  Location: [place];  Service: Neurosurgery;  Laterality: N/A;  . TEE WITHOUT CARDIOVERSION N/A 03/21/2018   Procedure: TRANSESOPHAGEAL ECHOCARDIOGRAM (TEE) WITH PROPOFOL;  Surgeon: [name], MD;  [facility];  Service: Cardiovascular;  Laterality: N/A;    There were no vitals filed for this visit.  Subjective Assessment - 05/02/20 0813    Subjective  Patient says is doing well overall, still complains of low back pain when standing. No pain currently at rest. Reports ongoing difficulty with balance and trouble walking.    Currently in Pain?  No/denies                    Adult Aquatic Therapy - 05/02/20 0823      Treatment   Gait  pool walking 30" 2RT with min gaurd, sidestepping 30' 2RT with min gaurd    Exercises  mini squats at wall x20, heel raises at wall x20, standing hip abduction x20 each, standing HS curls x20 each, semi tandem stance 3 x 30" each, lumbar traction using float 5 x  60", noodle push pull x20, double arm DB pull down x20                  PT Education - 05/02/20 0831    Education Details  On benefit and application of aquatic therapy, exercise technique of all added activity    Person(s) Educated  Patient    Methods  Explanation    Comprehension  Verbalized understanding       PT Short Term Goals - 04/22/20 1357      PT SHORT TERM GOAL #1   Title  Patient will be independent in self management strategies to improve quality of life and functional outcomes.    Time  3    Period  Weeks    Status  On-going    Target Date  05/01/20  PT SHORT TERM GOAL #2   Title  Patient will report at least 25% improvement in overall symptoms and/or functional ability.    Time  3    Period  Weeks    Status  On-going    Target Date  05/01/20      PT SHORT TERM GOAL #3   Title  Patient will report or demonstrate ability to perform ambulation for 2 minutes with LRAD with no more than supervision assistance without rest break and reported no more than minimal difficulty.     Time  3    Period  Weeks    Status  On-going    Target Date  05/01/20        PT Long Term Goals - 04/22/20 1358      PT LONG TERM GOAL #1   Title  Patient will report at least 50% improvement in overall symptoms and/or functional ability.    Time  6    Period  Weeks    Status  On-going      PT LONG TERM GOAL #2   Title  Patient will demonstrate ability to stand at a support surface for 10 minutes without a rest break in order to perform household chores and ADL's with increased ease.     Time  6    Period  Weeks    Status  On-going      PT LONG TERM GOAL #3   Title  Patient will demonstrate ability to ambulate 120 feet without rest break using LRAD with no more than minimal assistance.    Time  6    Period  Weeks    Status  Partially Met            Plan - 05/02/20 0817    Clinical Impression Statement  Patient tolerated session well today. Initiated  aquatic therapy. Patient educated on benefits of and application. Patient educated on purpose and and function of all added activity today. Patient with good tolerance to activity in gravity minimized environment, but requires frequent cueing for coordinated placement on LEs with gait and marching in pool. Patient required Min guard for balance with gait and balance activity. Patient will continue to benefit from skilled therapy services to progress core and LE strength to reduce pain and improve LOF with functional mobility.    Personal Factors and Comorbidities  Comorbidity 1;Comorbidity 2;Comorbidity 3+    Comorbidities  multiple lumbar and cervical fusions, paraplegic    Examination-Activity Limitations  Stand;Stairs;Locomotion Level    Examination-Participation Restrictions  Meal Prep;Cleaning;Community Activity    Stability/Clinical Decision Making  Stable/Uncomplicated    Rehab Potential  Good    PT Frequency  2x / week    PT Duration  6 weeks    PT Treatment/Interventions  ADLs/Self Care Home Management;Aquatic Therapy;Cryotherapy;Electrical Stimulation;Moist Heat;Balance training;Therapeutic exercise;Therapeutic activities;Functional mobility training;Stair training;Gait training;DME Instruction;Neuromuscular re-education;Patient/family education;Wheelchair mobility training;Manual techniques;Dry needling;Passive range of motion;Joint Manipulations    PT Next Visit Plan  LE strengthening and walking- WC follow, work towards prone lying (pillows under abdomen to start). Continue to progress gait in pool    PT Home Exercise Plan  4/22 bridges    Consulted and Agree with Plan of Care  Patient       Patient will benefit from skilled therapeutic intervention in order to improve the following deficits and impairments:  Abnormal gait, Decreased endurance, Impaired sensation, Pain, Decreased strength, Decreased activity tolerance, Decreased balance, Decreased mobility, Difficulty walking, Decreased  range of motion, Postural  dysfunction, Improper body mechanics  Visit Diagnosis: Muscle weakness (generalized)  Difficulty in walking, not elsewhere classified  Chronic midline low back pain without sciatica  Other abnormalities of gait and mobility     Problem List Patient Active Problem List   Diagnosis Date Noted  . Infection of spinal cord stimulator ([place]) 02/26/2020  . Pseudogout of knee   . Pyogenic arthritis of right knee joint ([place])   . Falls frequently 05/08/2018  . Fall   . Sleep disturbance   . Post-op pain   . Hypokalemia   . Morbid obesity ([place])   . Postoperative pain   . DVT, lower extremity, distal, acute, bilateral ([place])   . Acute blood loss anemia   . Anemia of chronic disease   . Essential hypertension   . Bacteremia   . Myelopathy ([place]) 03/30/2018  . Paraplegia ([place])   . Postlaminectomy syndrome, lumbar region   . Epidural abscess   . Abdominal distension   . Encephalopathy   . Weakness of both lower extremities   . Discitis of thoracic region   . Spinal cord stimulator status   . Staphylococcus aureus sepsis ([place]) 03/20/2018  . Bacteremia due to methicillin susceptible Staphylococcus aureus (MSSA) 03/18/2018  . Obesity 03/18/2018  . Nephrolithiasis 03/16/2018  . AKI (acute kidney injury) ([place]) 03/16/2018  . Cognitive impairment 03/16/2018  . Chronic pain syndrome 03/16/2018  . Cervicogenic headache 01/28/2016  . B12 deficiency 01/28/2016  . Memory difficulty 07/23/2015  . History of cerebrovascular disease 07/23/2015  . FH: colon cancer 08/13/2014  . Hx of adenomatous colonic polyps 08/13/2014  . Chronic back pain 05/30/2012  . CAD, NATIVE VESSEL 07/17/2010  . HYPERCHOLESTEROLEMIA 10/31/2009  . SMOKELESS TOBACCO ABUSE 10/31/2009  . Obstructive sleep apnea 10/31/2009  . Essential hypertension, benign 10/31/2009  . GERD 10/31/2009    8:32 AM, 05/02/20 Josue Hector PT DPT  Physical Therapist with [facility]   [phone] [place] [address] [place], [place], [address] Phone: [phone]   Fax:  [phone]  Name: Cody Velez MRN: [id number] Date of Birth: 12/30/1945

## 2020-05-06 ENCOUNTER — Ambulatory Visit (HOSPITAL_COMMUNITY): Payer: Medicare Other | Admitting: Physical Therapy

## 2020-05-06 ENCOUNTER — Other Ambulatory Visit: Payer: Self-pay

## 2020-05-06 DIAGNOSIS — R29898 Other symptoms and signs involving the musculoskeletal system: Secondary | ICD-10-CM | POA: Diagnosis not present

## 2020-05-06 DIAGNOSIS — M6281 Muscle weakness (generalized): Secondary | ICD-10-CM

## 2020-05-06 DIAGNOSIS — G8929 Other chronic pain: Secondary | ICD-10-CM | POA: Diagnosis not present

## 2020-05-06 DIAGNOSIS — M545 Low back pain: Secondary | ICD-10-CM | POA: Diagnosis not present

## 2020-05-06 DIAGNOSIS — R2689 Other abnormalities of gait and mobility: Secondary | ICD-10-CM | POA: Diagnosis not present

## 2020-05-06 DIAGNOSIS — R262 Difficulty in walking, not elsewhere classified: Secondary | ICD-10-CM | POA: Diagnosis not present

## 2020-05-06 NOTE — Therapy (Signed)
[place], [place], [address] Phone: [phone]   Fax:  [phone]  Physical Therapy Treatment  Patient Details  Name: Cody Velez MRN: [id number] Date of Birth: January 04, 1946 Referring Provider (PT): Dorothea Ogle   Encounter Date: 05/06/2020  PT End of Session - 05/06/20 1456    Visit Number  5    Number of Visits  12    Date for PT Re-Evaluation  05/21/20   Eval 4/21   Authorization Type  Primary Medicare, Secondary BCBS, [facility]. 15 VL with VA champ and 75 VL for BCBS no auth needed for [employer], [employer] covered    Authorization - Visit Number  5    Authorization - Number of Visits  15    Progress Note Due on Visit  10    PT Start Time  1315    PT Stop Time  1400    PT Time Calculation (min)  45 min    Equipment Utilized During Treatment  Gait belt    Activity Tolerance  Patient tolerated treatment well;No increased pain    Behavior During Therapy  WFL for tasks assessed/performed       Past Medical History:  Diagnosis Date  . Allergic rhinitis   . Arthritis   . B12 deficiency   . Cervicogenic headache 01/28/2016   no current problems per patient 02/22/20  . Childhood asthma    as a child, no problems as an adult  . Chronic back pain   . Coronary atherosclerosis of native coronary artery    Mild nonobstructive CAD at catheterization January 2015  . Dementia ([place])   . Depression   . Essential hypertension   . Falls   . GERD (gastroesophageal reflux disease)   . Glaucoma   . History of blood transfusion    with a surgery procedure  . History of cerebrovascular disease 07/23/2015  . History of kidney stones    surgery to removed  . History of pneumonia 02/2011  . Hyperlipidemia   . Memory difficulty 07/23/2015  . OSA (obstructive sleep apnea)    CPAP - Dr. [name] - uses cpap every night  . Pneumonia   . Prostate cancer ([place])   . PTSD (post-traumatic stress disorder)    [place]  . Rectal  bleeding   . Wheelchair bound     Past Surgical History:  Procedure Laterality Date  . APPLICATION OF ROBOTIC ASSISTANCE FOR SPINAL PROCEDURE N/A 03/28/2018   Procedure: APPLICATION OF ROBOTIC ASSISTANCE FOR SPINAL PROCEDURE;  Surgeon: Kristeen Miss, MD;  Location: [facility];  Service: Neurosurgery;  Laterality: N/A;  . BACK SURGERY  02/14/12   lumbar OR #7; "today redid L1L2; replaced screws; added bone from hip"  . BILATERAL KNEE ARTHROSCOPY    . COLONOSCOPY  10/15/2008   Dr. [name]: tubular adenoma   . COLONOSCOPY  12/17/2003   WEX:HBZJIR rectal and colon  . COLONOSCOPY N/A 09/05/2014   Procedure: COLONOSCOPY;  Surgeon: Daneil Dolin, MD;  [facility];  Service: Endoscopy;  Laterality: N/A;  7:30-rescheduled 9/17 to [place] notified pt  . CYSTOSCOPY WITH STENT PLACEMENT Right 01/27/2016   Procedure: CYSTOSCOPY WITH STENT PLACEMENT;  Surgeon: [name], MD;  [facility]: [facility];  Service: Urology;  Laterality: Right;  . CYSTOSCOPY/RETROGRADE/URETEROSCOPY/STONE EXTRACTION WITH BASKET Right 01/27/2016   Procedure: CYSTOSCOPY, RIGHT RETROGRADE, RIGHT URETEROSCOPY, STONE EXTRACTION ;  Surgeon: [name], MD;  [facility];  Service: Urology;  Laterality: Right;  .  ESOPHAGOGASTRODUODENOSCOPY  10/15/2008     Dr Rourk:Schatzki's ring status post dilation and disruption via 23 F Maloney dilator/ otherwise unremarkable esophagus, small hiatal hernia, multiple fundal gland polyps not manipulated, gastritis, negative H.pylori  . ESOPHAGOGASTRODUODENOSCOPY  06/21/02   XMI:WOEHO sliding hiatal hernia with mild changes of reflux esophagitis limited to gastroesophageal junction.  Noncritical ring at distal esophagus, 3 cm proximal to gastroesophageal junction/Antral gastritis  . [place]   "broke face playing softball"  . FRACTURE SURGERY     "left wrist; broke it; took spur off"  . HOLMIUM LASER APPLICATION Right 12/21/2480   Procedure: HOLMIUM LASER  APPLICATION;  Surgeon: [name], MD;  [facility];  Service: Urology;  Laterality: Right;  . KNEE ARTHROSCOPY Right 05/18/2018   Procedure: PARTIAL MEDIAL MENISECTOMY AND SURGICAL LAVAGE AND CHONDROPLASTY;  Surgeon: [name], MD;  Location: [facility];  Service: Orthopedics;  Laterality: Right;  . LEFT HEART CATHETERIZATION WITH CORONARY ANGIOGRAM N/A 12/27/2013   Procedure: LEFT HEART CATHETERIZATION WITH CORONARY ANGIOGRAM;  Surgeon: [name], MD;  Location: [facility] CATH LAB;  Service: Cardiovascular;  Laterality: N/A;  . neck epidural    . POSTERIOR LUMBAR FUSION 4 LEVEL N/A 03/28/2018   Procedure: Thoracic eight -Lumbar two- FIXATION WITH SCREW PLACEMENT, DECOMPRESSION Thoracic ten-Thoracic eleven  FOR OSTEOMYELITIS;  Surgeon: Kristeen Miss, MD;  Location: [facility];  Service: Neurosurgery;  Laterality: N/A;  . SHOULDER SURGERY Bilateral   . SPINAL CORD STIMULATOR REMOVAL N/A 02/25/2020   Procedure: LUMBAR SPINAL CORD STIMULATOR REMOVAL;  Surgeon: Kristeen Miss, MD;  Location: [facility];  Service: Neurosurgery;  Laterality: N/A;  . TEE WITHOUT CARDIOVERSION N/A 03/21/2018   Procedure: TRANSESOPHAGEAL ECHOCARDIOGRAM (TEE) WITH PROPOFOL;  Surgeon: [name], MD;  [facility];  Service: Cardiovascular;  Laterality: N/A;    There were no vitals filed for this visit.  Subjective Assessment - 05/06/20 1326    Subjective  Pt states he is doing good.  Pool therapy went well.  Slept late today.    Currently in Pain?  No/denies                        [facility] Adult PT Treatment/Exercise - 05/06/20 0001      Ambulation/Gait   Ambulation/Gait  Yes    Ambulation/Gait Assistance  4: Min assist;3: Mod assist    Ambulation/Gait Assistance Details  no rest breaks    Ambulation Distance (Feet)  226 Feet    Assistive device  Rolling walker    Ambulation Surface  Level;Indoor      Knee/Hip Exercises: Standing   Hip Flexion  AROM;Stengthening;Both;3 sets;10 reps;Knee  bent    Forward Lunges  Both;10 reps;1 set    Forward Lunges Limitations  onto 6" step with bil UE's    Hip Abduction  Both;3 sets;10 reps;Knee straight    Other Standing Knee Exercises  step taps forward and lateral in // bars 3x10 B     Other Standing Knee Exercises  side stepping in // bars  x10 laps with B UE support              PT Education - 05/06/20 1456    Education Details  encouraged to begin ambulating to bathroom and kitchen to eat instead of riding in chair    Person(s) Educated  Patient    Methods  Explanation    Comprehension  Verbalized understanding       PT Short Term  Goals - 04/22/20 1357      PT SHORT TERM GOAL #1   Title  Patient will be independent in self management strategies to improve quality of life and functional outcomes.    Time  3    Period  Weeks    Status  On-going    Target Date  05/01/20      PT SHORT TERM GOAL #2   Title  Patient will report at least 25% improvement in overall symptoms and/or functional ability.    Time  3    Period  Weeks    Status  On-going    Target Date  05/01/20      PT SHORT TERM GOAL #3   Title  Patient will report or demonstrate ability to perform ambulation for 2 minutes with LRAD with no more than supervision assistance without rest break and reported no more than minimal difficulty.     Time  3    Period  Weeks    Status  On-going    Target Date  05/01/20        PT Long Term Goals - 04/22/20 1358      PT LONG TERM GOAL #1   Title  Patient will report at least 50% improvement in overall symptoms and/or functional ability.    Time  6    Period  Weeks    Status  On-going      PT LONG TERM GOAL #2   Title  Patient will demonstrate ability to stand at a support surface for 10 minutes without a rest break in order to perform household chores and ADL's with increased ease.     Time  6    Period  Weeks    Status  On-going      PT LONG TERM GOAL #3   Title  Patient will demonstrate ability to  ambulate 120 feet without rest break using LRAD with no more than minimal assistance.    Time  6    Period  Weeks    Status  Partially Met            Plan - 05/06/20 1509    Clinical Impression Statement  Pt able to complete full RT around department without rest break this session.  Encouarged pt to begin ambulating to his bathroom when he has to go and to kitchen to eat.  Completed most of established standing therex this session with addition of hip extension  and lunges onto 6".  Pt required cues for form as tends to go deeper than is safe for knee stability.  Pt mostly used bil UE's during session and continues to require cues for proper/safe standing from wheelchair regarding UE placement and usage.    Personal Factors and Comorbidities  Comorbidity 1;Comorbidity 2;Comorbidity 3+    Comorbidities  multiple lumbar and cervical fusions, paraplegic    Examination-Activity Limitations  Stand;Stairs;Locomotion Level    Examination-Participation Restrictions  Meal Prep;Cleaning;Community Activity    Stability/Clinical Decision Making  Stable/Uncomplicated    Rehab Potential  Good    PT Frequency  2x / week    PT Duration  6 weeks    PT Treatment/Interventions  ADLs/Self Care Home Management;Aquatic Therapy;Cryotherapy;Electrical Stimulation;Moist Heat;Balance training;Therapeutic exercise;Therapeutic activities;Functional mobility training;Stair training;Gait training;DME Instruction;Neuromuscular re-education;Patient/family education;Wheelchair mobility training;Manual techniques;Dry needling;Passive range of motion;Joint Manipulations    PT Next Visit Plan  LE strengthening and walking- WC follow, work towards prone lying (pillows under abdomen to start). Continue to progress gait in pool  PT Home Exercise Plan  4/22 bridges    Consulted and Agree with Plan of Care  Patient       Patient will benefit from skilled therapeutic intervention in order to improve the following deficits and  impairments:  Abnormal gait, Decreased endurance, Impaired sensation, Pain, Decreased strength, Decreased activity tolerance, Decreased balance, Decreased mobility, Difficulty walking, Decreased range of motion, Postural dysfunction, Improper body mechanics  Visit Diagnosis: Difficulty in walking, not elsewhere classified  Muscle weakness (generalized)     Problem List Patient Active Problem List   Diagnosis Date Noted  . Infection of spinal cord stimulator ([place]) 02/26/2020  . Pseudogout of knee   . Pyogenic arthritis of right knee joint ([place])   . Falls frequently 05/08/2018  . Fall   . Sleep disturbance   . Post-op pain   . Hypokalemia   . Morbid obesity ([place])   . Postoperative pain   . DVT, lower extremity, distal, acute, bilateral ([place])   . Acute blood loss anemia   . Anemia of chronic disease   . Essential hypertension   . Bacteremia   . Myelopathy ([place]) 03/30/2018  . Paraplegia ([place])   . Postlaminectomy syndrome, lumbar region   . Epidural abscess   . Abdominal distension   . Encephalopathy   . Weakness of both lower extremities   . Discitis of thoracic region   . Spinal cord stimulator status   . Staphylococcus aureus sepsis ([place]) 03/20/2018  . Bacteremia due to methicillin susceptible Staphylococcus aureus (MSSA) 03/18/2018  . Obesity 03/18/2018  . Nephrolithiasis 03/16/2018  . AKI (acute kidney injury) ([place]) 03/16/2018  . Cognitive impairment 03/16/2018  . Chronic pain syndrome 03/16/2018  . Cervicogenic headache 01/28/2016  . B12 deficiency 01/28/2016  . Memory difficulty 07/23/2015  . History of cerebrovascular disease 07/23/2015  . FH: colon cancer 08/13/2014  . Hx of adenomatous colonic polyps 08/13/2014  . Chronic back pain 05/30/2012  . CAD, NATIVE VESSEL 07/17/2010  . HYPERCHOLESTEROLEMIA 10/31/2009  . SMOKELESS TOBACCO ABUSE 10/31/2009  . Obstructive sleep apnea 10/31/2009  . Essential hypertension, benign 10/31/2009  . GERD 10/31/2009   Teena Irani, PTA/CLT [phone]  Teena Irani 05/06/2020, 3:13 PM  [facility] [address] [place], [place], [address] Phone: [phone]   Fax:  [phone]  Name: JURGEN GROENEVELD MRN: [id number] Date of Birth: 06/14/1946

## 2020-05-08 ENCOUNTER — Encounter (HOSPITAL_COMMUNITY): Payer: Self-pay | Admitting: Physical Therapy

## 2020-05-08 ENCOUNTER — Other Ambulatory Visit: Payer: Self-pay

## 2020-05-08 ENCOUNTER — Ambulatory Visit (HOSPITAL_COMMUNITY): Payer: Medicare Other | Admitting: Physical Therapy

## 2020-05-08 DIAGNOSIS — R2689 Other abnormalities of gait and mobility: Secondary | ICD-10-CM

## 2020-05-08 DIAGNOSIS — M6281 Muscle weakness (generalized): Secondary | ICD-10-CM | POA: Diagnosis not present

## 2020-05-08 DIAGNOSIS — R262 Difficulty in walking, not elsewhere classified: Secondary | ICD-10-CM

## 2020-05-08 DIAGNOSIS — R29898 Other symptoms and signs involving the musculoskeletal system: Secondary | ICD-10-CM

## 2020-05-08 DIAGNOSIS — G8929 Other chronic pain: Secondary | ICD-10-CM

## 2020-05-08 DIAGNOSIS — M545 Low back pain, unspecified: Secondary | ICD-10-CM

## 2020-05-08 NOTE — Therapy (Signed)
[place], [place], [address] Phone: [phone]   Fax:  [phone]  Physical Therapy Treatment  Patient Details  Name: Cody Velez MRN: [id number] Date of Birth: 11-19-1946 Referring Provider (PT): Dorothea Ogle   Encounter Date: 05/08/2020  PT End of Session - 05/08/20 1133    Visit Number  6    Number of Visits  12    Date for PT Re-Evaluation  05/21/20   Eval 4/21   Authorization Type  Primary Medicare, Secondary BCBS, [facility]. 15 VL with VA champ and 75 VL for BCBS no auth needed for [employer], [employer] covered    Authorization - Visit Number  6    Authorization - Number of Visits  15    Progress Note Due on Visit  10    PT Start Time  1132    PT Stop Time  1210    PT Time Calculation (min)  38 min    Equipment Utilized During Treatment  Gait belt    Activity Tolerance  Patient tolerated treatment well;No increased pain    Behavior During Therapy  WFL for tasks assessed/performed       Past Medical History:  Diagnosis Date  . Allergic rhinitis   . Arthritis   . B12 deficiency   . Cervicogenic headache 01/28/2016   no current problems per patient 02/22/20  . Childhood asthma    as a child, no problems as an adult  . Chronic back pain   . Coronary atherosclerosis of native coronary artery    Mild nonobstructive CAD at catheterization January 2015  . Dementia ([place])   . Depression   . Essential hypertension   . Falls   . GERD (gastroesophageal reflux disease)   . Glaucoma   . History of blood transfusion    with a surgery procedure  . History of cerebrovascular disease 07/23/2015  . History of kidney stones    surgery to removed  . History of pneumonia 02/2011  . Hyperlipidemia   . Memory difficulty 07/23/2015  . OSA (obstructive sleep apnea)    CPAP - Dr. [name] - uses cpap every night  . Pneumonia   . Prostate cancer ([place])   . PTSD (post-traumatic stress disorder)    [place]  . Rectal  bleeding   . Wheelchair bound     Past Surgical History:  Procedure Laterality Date  . APPLICATION OF ROBOTIC ASSISTANCE FOR SPINAL PROCEDURE N/A 03/28/2018   Procedure: APPLICATION OF ROBOTIC ASSISTANCE FOR SPINAL PROCEDURE;  Surgeon: Kristeen Miss, MD;  Location: [facility];  Service: Neurosurgery;  Laterality: N/A;  . BACK SURGERY  02/14/12   lumbar OR #7; "today redid L1L2; replaced screws; added bone from hip"  . BILATERAL KNEE ARTHROSCOPY    . COLONOSCOPY  10/15/2008   Dr. [name]: tubular adenoma   . COLONOSCOPY  12/17/2003   KTG:[id number] rectal and colon  . COLONOSCOPY N/A 09/05/2014   Procedure: COLONOSCOPY;  Surgeon: Daneil Dolin, MD;  [facility];  Service: Endoscopy;  Laterality: N/A;  7:30-rescheduled 9/17 to [place] notified pt  . CYSTOSCOPY WITH STENT PLACEMENT Right 01/27/2016   Procedure: CYSTOSCOPY WITH STENT PLACEMENT;  Surgeon: [name], MD;  Location: [facility];  Service: Urology;  Laterality: Right;  . CYSTOSCOPY/RETROGRADE/URETEROSCOPY/STONE EXTRACTION WITH BASKET Right 01/27/2016   Procedure: CYSTOSCOPY, RIGHT RETROGRADE, RIGHT URETEROSCOPY, STONE EXTRACTION ;  Surgeon: [name], MD;  [facility];  Service: Urology;  Laterality: Right;  .  ESOPHAGOGASTRODUODENOSCOPY  10/15/2008     Dr Rourk:Schatzki's ring status post dilation and disruption via 11 F Maloney dilator/ otherwise unremarkable esophagus, small hiatal hernia, multiple fundal gland polyps not manipulated, gastritis, negative H.pylori  . ESOPHAGOGASTRODUODENOSCOPY  06/21/02   NWG:NFAOZ sliding hiatal hernia with mild changes of reflux esophagitis limited to gastroesophageal junction.  Noncritical ring at distal esophagus, 3 cm proximal to gastroesophageal junction/Antral gastritis  . [place]   "broke face playing softball"  . FRACTURE SURGERY     "left wrist; broke it; took spur off"  . HOLMIUM LASER APPLICATION Right 3/0/8657   Procedure: HOLMIUM LASER  APPLICATION;  Surgeon: [name], MD;  [facility];  Service: Urology;  Laterality: Right;  . KNEE ARTHROSCOPY Right 05/18/2018   Procedure: PARTIAL MEDIAL MENISECTOMY AND SURGICAL LAVAGE AND CHONDROPLASTY;  Surgeon: [name], MD;  Location: [facility];  Service: Orthopedics;  Laterality: Right;  . LEFT HEART CATHETERIZATION WITH CORONARY ANGIOGRAM N/A 12/27/2013   Procedure: LEFT HEART CATHETERIZATION WITH CORONARY ANGIOGRAM;  Surgeon: [name], MD;  Location: [facility] CATH LAB;  Service: Cardiovascular;  Laterality: N/A;  . neck epidural    . POSTERIOR LUMBAR FUSION 4 LEVEL N/A 03/28/2018   Procedure: Thoracic eight -Lumbar two- FIXATION WITH SCREW PLACEMENT, DECOMPRESSION Thoracic ten-Thoracic eleven  FOR OSTEOMYELITIS;  Surgeon: Kristeen Miss, MD;  Location: [facility];  Service: Neurosurgery;  Laterality: N/A;  . SHOULDER SURGERY Bilateral   . SPINAL CORD STIMULATOR REMOVAL N/A 02/25/2020   Procedure: LUMBAR SPINAL CORD STIMULATOR REMOVAL;  Surgeon: Kristeen Miss, MD;  Location: [facility];  Service: Neurosurgery;  Laterality: N/A;  . TEE WITHOUT CARDIOVERSION N/A 03/21/2018   Procedure: TRANSESOPHAGEAL ECHOCARDIOGRAM (TEE) WITH PROPOFOL;  Surgeon: [name], MD;  [facility];  Service: Cardiovascular;  Laterality: N/A;    There were no vitals filed for this visit.  Subjective Assessment - 05/08/20 1135    Subjective  States he is feeling better today.    Currently in Pain?  Yes    Pain Location  Back    Pain Orientation  Lower         [facility] PT Assessment - 05/08/20 0001      Assessment   Medical Diagnosis  LBP    Referring Provider (PT)  Dorothea Ogle                    [facility] Adult PT Treatment/Exercise - 05/08/20 0001      Ambulation/Gait   Ambulation/Gait  Yes    Ambulation/Gait Assistance  4: Min assist;3: Mod assist    Ambulation/Gait Assistance Details  no rests    Ambulation Distance (Feet)  226 Feet    Assistive device  Rolling walker     Ambulation Surface  Level;Indoor    Gait Comments  x2 laps      Knee/Hip Exercises: Standing   Other Standing Knee Exercises  step taps forward  in // bars 3x10 B, one UE support - 4"       Knee/Hip Exercises: Seated   Sit to Sand  10 reps;with UE support   3 sets               PT Short Term Goals - 04/22/20 1357      PT SHORT TERM GOAL #1   Title  Patient will be independent in self management strategies to improve quality of life and functional outcomes.    Time  3    Period  Weeks    Status  On-going    Target Date  05/01/20      PT SHORT TERM GOAL #2   Title  Patient will report at least 25% improvement in overall symptoms and/or functional ability.    Time  3    Period  Weeks    Status  On-going    Target Date  05/01/20      PT SHORT TERM GOAL #3   Title  Patient will report or demonstrate ability to perform ambulation for 2 minutes with LRAD with no more than supervision assistance without rest break and reported no more than minimal difficulty.     Time  3    Period  Weeks    Status  On-going    Target Date  05/01/20        PT Long Term Goals - 04/22/20 1358      PT LONG TERM GOAL #1   Title  Patient will report at least 50% improvement in overall symptoms and/or functional ability.    Time  6    Period  Weeks    Status  On-going      PT LONG TERM GOAL #2   Title  Patient will demonstrate ability to stand at a support surface for 10 minutes without a rest break in order to perform household chores and ADL's with increased ease.     Time  6    Period  Weeks    Status  On-going      PT LONG TERM GOAL #3   Title  Patient will demonstrate ability to ambulate 120 feet without rest break using LRAD with no more than minimal assistance.    Time  6    Period  Weeks    Status  Partially Met            Plan - 05/08/20 1134    Clinical Impression Statement  Practiced stepping up on step with one upper extremity assist. Tolerated this moderately  well. Right leg started to really fatigue with standing exercises. Cued patient to take longer rest breaks. Improved walking endurance with good form noted (not scissoring legs). Will continue to work on standing strength and endurance as tolerated.    Personal Factors and Comorbidities  Comorbidity 1;Comorbidity 2;Comorbidity 3+    Comorbidities  multiple lumbar and cervical fusions, paraplegic    Examination-Activity Limitations  Stand;Stairs;Locomotion Level    Examination-Participation Restrictions  Meal Prep;Cleaning;Community Activity    Stability/Clinical Decision Making  Stable/Uncomplicated    Rehab Potential  Good    PT Frequency  2x / week    PT Duration  6 weeks    PT Treatment/Interventions  ADLs/Self Care Home Management;Aquatic Therapy;Cryotherapy;Electrical Stimulation;Moist Heat;Balance training;Therapeutic exercise;Therapeutic activities;Functional mobility training;Stair training;Gait training;DME Instruction;Neuromuscular re-education;Patient/family education;Wheelchair mobility training;Manual techniques;Dry needling;Passive range of motion;Joint Manipulations    PT Next Visit Plan  LE strengthening and walking- WC follow, work towards prone lying (pillows under abdomen to start). Continue to progress gait in pool    PT Home Exercise Plan  4/22 bridges    Consulted and Agree with Plan of Care  Patient       Patient will benefit from skilled therapeutic intervention in order to improve the following deficits and impairments:  Abnormal gait, Decreased endurance, Impaired sensation, Pain, Decreased strength, Decreased activity tolerance, Decreased balance, Decreased mobility, Difficulty walking, Decreased range of motion, Postural dysfunction, Improper body mechanics  Visit Diagnosis: Difficulty in walking, not elsewhere classified  Muscle weakness (generalized)  Chronic midline low back pain without sciatica  Other abnormalities of gait and mobility  Other symptoms and  signs involving the musculoskeletal system     Problem List Patient Active Problem List   Diagnosis Date Noted  . Infection of spinal cord stimulator ([place]) 02/26/2020  . Pseudogout of knee   . Pyogenic arthritis of right knee joint ([place])   . Falls frequently 05/08/2018  . Fall   . Sleep disturbance   . Post-op pain   . Hypokalemia   . Morbid obesity ([place])   . Postoperative pain   . DVT, lower extremity, distal, acute, bilateral ([place])   . Acute blood loss anemia   . Anemia of chronic disease   . Essential hypertension   . Bacteremia   . Myelopathy ([place]) 03/30/2018  . Paraplegia ([place])   . Postlaminectomy syndrome, lumbar region   . Epidural abscess   . Abdominal distension   . Encephalopathy   . Weakness of both lower extremities   . Discitis of thoracic region   . Spinal cord stimulator status   . Staphylococcus aureus sepsis ([place]) 03/20/2018  . Bacteremia due to methicillin susceptible Staphylococcus aureus (MSSA) 03/18/2018  . Obesity 03/18/2018  . Nephrolithiasis 03/16/2018  . AKI (acute kidney injury) ([place]) 03/16/2018  . Cognitive impairment 03/16/2018  . Chronic pain syndrome 03/16/2018  . Cervicogenic headache 01/28/2016  . B12 deficiency 01/28/2016  . Memory difficulty 07/23/2015  . History of cerebrovascular disease 07/23/2015  . FH: colon cancer 08/13/2014  . Hx of adenomatous colonic polyps 08/13/2014  . Chronic back pain 05/30/2012  . CAD, NATIVE VESSEL 07/17/2010  . HYPERCHOLESTEROLEMIA 10/31/2009  . SMOKELESS TOBACCO ABUSE 10/31/2009  . Obstructive sleep apnea 10/31/2009  . Essential hypertension, benign 10/31/2009  . GERD 10/31/2009    12:10 PM, 05/08/20 Jerene Pitch, DPT Physical Therapy with [facility]  [phone] office  [facility] [address] [place], [place], [address] Phone: [phone]   Fax:  [phone]  Name: Cody Velez MRN: [id number] Date of Birth:  March 27, 1946

## 2020-05-13 ENCOUNTER — Ambulatory Visit (HOSPITAL_COMMUNITY): Payer: Medicare Other | Admitting: Physical Therapy

## 2020-05-13 ENCOUNTER — Other Ambulatory Visit: Payer: Self-pay

## 2020-05-13 DIAGNOSIS — M545 Low back pain: Secondary | ICD-10-CM | POA: Diagnosis not present

## 2020-05-13 DIAGNOSIS — M6281 Muscle weakness (generalized): Secondary | ICD-10-CM

## 2020-05-13 DIAGNOSIS — R262 Difficulty in walking, not elsewhere classified: Secondary | ICD-10-CM

## 2020-05-13 DIAGNOSIS — G8929 Other chronic pain: Secondary | ICD-10-CM

## 2020-05-13 DIAGNOSIS — R29898 Other symptoms and signs involving the musculoskeletal system: Secondary | ICD-10-CM | POA: Diagnosis not present

## 2020-05-13 DIAGNOSIS — R2689 Other abnormalities of gait and mobility: Secondary | ICD-10-CM

## 2020-05-13 NOTE — Therapy (Signed)
[place], [place], [address] Phone: [phone]   Fax:  [phone]  Physical Therapy Treatment  Patient Details  Name: Cody Velez MRN: [id number] Date of Birth: [birth year] Referring Provider (PT): Dorothea Ogle   Encounter Date: 05/13/2020  PT End of Session - 05/13/20 1446    Visit Number  7    Number of Visits  12    Date for PT Re-Evaluation  05/21/20   Eval 4/21   Authorization Type  Primary Medicare, Secondary BCBS, [facility] [place]. 15 VL with VA champ and 75 VL for BCBS no auth needed for [employer], [employer] covered    Authorization - Visit Number  7    Authorization - Number of Visits  15    Progress Note Due on Visit  10    PT Start Time  0539    PT Stop Time  1448    PT Time Calculation (min)  44 min    Equipment Utilized During Treatment  Gait belt    Activity Tolerance  Patient tolerated treatment well;No increased pain    Behavior During Therapy  WFL for tasks assessed/performed       Past Medical History:  Diagnosis Date  . Allergic rhinitis   . Arthritis   . B12 deficiency   . Cervicogenic headache 01/28/2016   no current problems per patient 02/22/20  . Childhood asthma    as a child, no problems as an adult  . Chronic back pain   . Coronary atherosclerosis of native coronary artery    Mild nonobstructive CAD at catheterization January 2015  . Dementia ([place])   . Depression   . Essential hypertension   . Falls   . GERD (gastroesophageal reflux disease)   . Glaucoma   . History of blood transfusion    with a surgery procedure  . History of cerebrovascular disease 07/23/2015  . History of kidney stones    surgery to removed  . History of pneumonia 02/2011  . Hyperlipidemia   . Memory difficulty 07/23/2015  . OSA (obstructive sleep apnea)    CPAP - Dr. [name] - uses cpap every night  . Pneumonia   . Prostate cancer ([place])   . PTSD (post-traumatic stress disorder)    [place]  . Rectal  bleeding   . Wheelchair bound     Past Surgical History:  Procedure Laterality Date  . APPLICATION OF ROBOTIC ASSISTANCE FOR SPINAL PROCEDURE N/A 03/28/2018   Procedure: APPLICATION OF ROBOTIC ASSISTANCE FOR SPINAL PROCEDURE;  Surgeon: Kristeen Miss, MD;  Location: [facility];  Service: Neurosurgery;  Laterality: N/A;  . BACK SURGERY  02/14/12   lumbar OR #7; "today redid L1L2; replaced screws; added bone from hip"  . BILATERAL KNEE ARTHROSCOPY    . COLONOSCOPY  10/15/2008   Dr. [name]: tubular adenoma   . COLONOSCOPY  12/17/2003   JQB:HALPFX rectal and colon  . COLONOSCOPY N/A 09/05/2014   Procedure: COLONOSCOPY;  Surgeon: Daneil Dolin, MD;  [facility];  Service: Endoscopy;  Laterality: N/A;  7:30-rescheduled 9/17 to [facility] notified pt  . CYSTOSCOPY WITH STENT PLACEMENT Right 01/27/2016   Procedure: CYSTOSCOPY WITH STENT PLACEMENT;  Surgeon: [name], MD;  Location[facility];  Service: Urology;  Laterality: Right;  . CYSTOSCOPY/RETROGRADE/URETEROSCOPY/STONE EXTRACTION WITH BASKET Right 01/27/2016   Procedure: CYSTOSCOPY, RIGHT RETROGRADE, RIGHT URETEROSCOPY, STONE EXTRACTION ;  Surgeon: [name], MD;  Location[facility];  Service: Urology;  Laterality: Right;  .  ESOPHAGOGASTRODUODENOSCOPY  10/15/2008     Dr Rourk:Schatzki's ring status post dilation and disruption via 62 F Maloney dilator/ otherwise unremarkable esophagus, small hiatal hernia, multiple fundal gland polyps not manipulated, gastritis, negative H.pylori  . ESOPHAGOGASTRODUODENOSCOPY  06/21/02   AQT:MAUQJ sliding hiatal hernia with mild changes of reflux esophagitis limited to gastroesophageal junction.  Noncritical ring at distal esophagus, 3 cm proximal to gastroesophageal junction/Antral gastritis  . [place]   "broke face playing softball"  . FRACTURE SURGERY     "left wrist; broke it; took spur off"  . HOLMIUM LASER APPLICATION Right 02/20/5455   Procedure: HOLMIUM LASER  APPLICATION;  Surgeon: [name], MD;  Location: [facility];  Service: Urology;  Laterality: Right;  . KNEE ARTHROSCOPY Right 05/18/2018   Procedure: PARTIAL MEDIAL MENISECTOMY AND SURGICAL LAVAGE AND CHONDROPLASTY;  Surgeon: [name], MD;  Location: [facility];  Service: Orthopedics;  Laterality: Right;  . LEFT HEART CATHETERIZATION WITH CORONARY ANGIOGRAM N/A 12/27/2013   Procedure: LEFT HEART CATHETERIZATION WITH CORONARY ANGIOGRAM;  Surgeon: [name], MD;  Location: [facility] LAB;  Service: Cardiovascular;  Laterality: N/A;  . neck epidural    . POSTERIOR LUMBAR FUSION 4 LEVEL N/A 03/28/2018   Procedure: Thoracic eight -Lumbar two- FIXATION WITH SCREW PLACEMENT, DECOMPRESSION Thoracic ten-Thoracic eleven  FOR OSTEOMYELITIS;  Surgeon: Kristeen Miss, MD;  Location: [facility];  Service: Neurosurgery;  Laterality: N/A;  . SHOULDER SURGERY Bilateral   . SPINAL CORD STIMULATOR REMOVAL N/A 02/25/2020   Procedure: LUMBAR SPINAL CORD STIMULATOR REMOVAL;  Surgeon: Kristeen Miss, MD;  Location: [facility];  Service: Neurosurgery;  Laterality: N/A;  . TEE WITHOUT CARDIOVERSION N/A 03/21/2018   Procedure: TRANSESOPHAGEAL ECHOCARDIOGRAM (TEE) WITH PROPOFOL;  Surgeon: [name], MD;  [facility];  Service: Cardiovascular;  Laterality: N/A;    There were no vitals filed for this visit.  Subjective Assessment - 05/13/20 1432    Subjective  Pt states he is doing well today.  STates he is not walking at home because his GF is not comfortable with him doing so.    Currently in Pain?  No/denies                        [facility] Adult PT Treatment/Exercise - 05/13/20 0001      Ambulation/Gait   Ambulation/Gait  Yes    Ambulation/Gait Assistance  4: Min assist;3: Mod assist    Ambulation/Gait Assistance Details  one seated rest break between each lap    Ambulation Distance (Feet)  226 Feet    Assistive device  Rolling walker    Ambulation Surface  Level;Indoor    Gait Comments  x2  laps      Knee/Hip Exercises: Standing   Hip Flexion  3 sets;10 reps    Hip Flexion Limitations  alternating slow controlled    Hip Abduction  Both;3 sets;10 reps;Knee straight      Knee/Hip Exercises: Seated   Sit to Sand  10 reps;with UE support               PT Short Term Goals - 04/22/20 1357      PT SHORT TERM GOAL #1   Title  Patient will be independent in self management strategies to improve quality of life and functional outcomes.    Time  3    Period  Weeks    Status  On-going    Target Date  05/01/20  PT SHORT TERM GOAL #2   Title  Patient will report at least 25% improvement in overall symptoms and/or functional ability.    Time  3    Period  Weeks    Status  On-going    Target Date  05/01/20      PT SHORT TERM GOAL #3   Title  Patient will report or demonstrate ability to perform ambulation for 2 minutes with LRAD with no more than supervision assistance without rest break and reported no more than minimal difficulty.     Time  3    Period  Weeks    Status  On-going    Target Date  05/01/20        PT Long Term Goals - 04/22/20 1358      PT LONG TERM GOAL #1   Title  Patient will report at least 50% improvement in overall symptoms and/or functional ability.    Time  6    Period  Weeks    Status  On-going      PT LONG TERM GOAL #2   Title  Patient will demonstrate ability to stand at a support surface for 10 minutes without a rest break in order to perform household chores and ADL's with increased ease.     Time  6    Period  Weeks    Status  On-going      PT LONG TERM GOAL #3   Title  Patient will demonstrate ability to ambulate 120 feet without rest break using LRAD with no more than minimal assistance.    Time  6    Period  Weeks    Status  Partially Met            Plan - 05/13/20 1450    Clinical Impression Statement  pt able to complete 2 full RT of ambulation this session with short seated rest break between.  Noted LE  weakness and progressing forward posturing towards end of last lap.  Verbal cues needed for posturing with therex and completing more slowly.    Personal Factors and Comorbidities  Comorbidity 1;Comorbidity 2;Comorbidity 3+    Comorbidities  multiple lumbar and cervical fusions, paraplegic    Examination-Activity Limitations  Stand;Stairs;Locomotion Level    Examination-Participation Restrictions  Meal Prep;Cleaning;Community Activity    Stability/Clinical Decision Making  Stable/Uncomplicated    Rehab Potential  Good    PT Frequency  2x / week    PT Duration  6 weeks    PT Treatment/Interventions  ADLs/Self Care Home Management;Aquatic Therapy;Cryotherapy;Electrical Stimulation;Moist Heat;Balance training;Therapeutic exercise;Therapeutic activities;Functional mobility training;Stair training;Gait training;DME Instruction;Neuromuscular re-education;Patient/family education;Wheelchair mobility training;Manual techniques;Dry needling;Passive range of motion;Joint Manipulations    PT Next Visit Plan  LE strengthening and walking- WC follow, work towards prone lying (pillows under abdomen to start). Continue to progress gait in pool    PT Home Exercise Plan  4/22 bridges    Consulted and Agree with Plan of Care  Patient       Patient will benefit from skilled therapeutic intervention in order to improve the following deficits and impairments:  Abnormal gait, Decreased endurance, Impaired sensation, Pain, Decreased strength, Decreased activity tolerance, Decreased balance, Decreased mobility, Difficulty walking, Decreased range of motion, Postural dysfunction, Improper body mechanics  Visit Diagnosis: Difficulty in walking, not elsewhere classified  Muscle weakness (generalized)  Chronic midline low back pain without sciatica  Other abnormalities of gait and mobility  Other symptoms and signs involving the musculoskeletal system     Problem  List Patient Active Problem List   Diagnosis  Date Noted  . Infection of spinal cord stimulator ([place]) 02/26/2020  . Pseudogout of knee   . Pyogenic arthritis of right knee joint ([place])   . Falls frequently 05/08/2018  . Fall   . Sleep disturbance   . Post-op pain   . Hypokalemia   . Morbid obesity ([place])   . Postoperative pain   . DVT, lower extremity, distal, acute, bilateral ([place])   . Acute blood loss anemia   . Anemia of chronic disease   . Essential hypertension   . Bacteremia   . Myelopathy ([place]) 03/30/2018  . Paraplegia ([place])   . Postlaminectomy syndrome, lumbar region   . Epidural abscess   . Abdominal distension   . Encephalopathy   . Weakness of both lower extremities   . Discitis of thoracic region   . Spinal cord stimulator status   . Staphylococcus aureus sepsis ([place]) 03/20/2018  . Bacteremia due to methicillin susceptible Staphylococcus aureus (MSSA) 03/18/2018  . Obesity 03/18/2018  . Nephrolithiasis 03/16/2018  . AKI (acute kidney injury) ([place]) 03/16/2018  . Cognitive impairment 03/16/2018  . Chronic pain syndrome 03/16/2018  . Cervicogenic headache 01/28/2016  . B12 deficiency 01/28/2016  . Memory difficulty 07/23/2015  . History of cerebrovascular disease 07/23/2015  . FH: colon cancer 08/13/2014  . Hx of adenomatous colonic polyps 08/13/2014  . Chronic back pain 05/30/2012  . CAD, NATIVE VESSEL 07/17/2010  . HYPERCHOLESTEROLEMIA 10/31/2009  . SMOKELESS TOBACCO ABUSE 10/31/2009  . Obstructive sleep apnea 10/31/2009  . Essential hypertension, benign 10/31/2009  . GERD 10/31/2009   Teena Irani, PTA/CLT [phone]  Teena Irani 05/13/2020, 2:53 PM  [facility] [address] [place], [place], [address] Phone: [phone]   Fax:  [phone]  Name: Cody Velez MRN: [id number] Date of Birth: 17-Sep-1946

## 2020-05-15 ENCOUNTER — Ambulatory Visit (HOSPITAL_COMMUNITY): Payer: Non-veteran care | Admitting: Physical Therapy

## 2020-05-16 ENCOUNTER — Telehealth: Payer: Self-pay

## 2020-05-16 NOTE — Telephone Encounter (Signed)
COVID-19 Pre-Screening Questions:05/16/20  Do you currently have a fever (>100 F), chills or unexplained body aches? NO  Are you currently experiencing new cough, shortness of breath, sore throat, runny nose?NO .  . Have you recently travelled outside the state of [place] in the last 14 days?NO    Have you been in contact with someone that is currently pending confirmation of Covid19 testing or has been confirmed to have the Covid19 virus? NO  **If the patient answers NO to ALL questions -  advise the patient to please call the clinic before coming to the office should any symptoms develop.    

## 2020-05-20 ENCOUNTER — Ambulatory Visit (INDEPENDENT_AMBULATORY_CARE_PROVIDER_SITE_OTHER): Payer: Medicare Other | Admitting: Internal Medicine

## 2020-05-20 ENCOUNTER — Encounter (HOSPITAL_COMMUNITY): Payer: Non-veteran care | Admitting: Physical Therapy

## 2020-05-20 ENCOUNTER — Encounter: Payer: Self-pay | Admitting: Internal Medicine

## 2020-05-20 ENCOUNTER — Other Ambulatory Visit: Payer: Self-pay

## 2020-05-20 ENCOUNTER — Telehealth (HOSPITAL_COMMUNITY): Payer: Self-pay | Admitting: Physical Therapy

## 2020-05-20 DIAGNOSIS — T85733D Infection and inflammatory reaction due to implanted electronic neurostimulator of spinal cord, electrode (lead), subsequent encounter: Secondary | ICD-10-CM

## 2020-05-20 NOTE — Telephone Encounter (Signed)
Pt l/m to cx he is not feeling well -he has a lot of neck pain

## 2020-05-20 NOTE — Progress Notes (Signed)
[facility] for Infectious Disease  Patient Active Problem List   Diagnosis Date Noted  . Infection of spinal cord stimulator ([place]) 02/26/2020    Priority: High  . Discitis of thoracic region     Priority: High  . Bacteremia due to methicillin susceptible Staphylococcus aureus (MSSA) 03/18/2018    Priority: High  . Pseudogout of knee   . Pyogenic arthritis of right knee joint ([place])   . Falls frequently 05/08/2018  . Fall   . Sleep disturbance   . Post-op pain   . Hypokalemia   . Morbid obesity ([place])   . Postoperative pain   . DVT, lower extremity, distal, acute, bilateral ([place])   . Acute blood loss anemia   . Anemia of chronic disease   . Essential hypertension   . Bacteremia   . Myelopathy ([place]) 03/30/2018  . Paraplegia ([place])   . Postlaminectomy syndrome, lumbar region   . Epidural abscess   . Abdominal distension   . Encephalopathy   . Weakness of both lower extremities   . Spinal cord stimulator status   . Staphylococcus aureus sepsis ([place]) 03/20/2018  . Obesity 03/18/2018  . Nephrolithiasis 03/16/2018  . AKI (acute kidney injury) ([place]) 03/16/2018  . Cognitive impairment 03/16/2018  . Chronic pain syndrome 03/16/2018  . Cervicogenic headache 01/28/2016  . B12 deficiency 01/28/2016  . Memory difficulty 07/23/2015  . History of cerebrovascular disease 07/23/2015  . FH: colon cancer 08/13/2014  . Hx of adenomatous colonic polyps 08/13/2014  . Chronic back pain 05/30/2012  . CAD, NATIVE VESSEL 07/17/2010  . HYPERCHOLESTEROLEMIA 10/31/2009  . SMOKELESS TOBACCO ABUSE 10/31/2009  . Obstructive sleep apnea 10/31/2009  . Essential hypertension, benign 10/31/2009  . GERD 10/31/2009    Patient's Medications  New Prescriptions   No medications on file  Previous Medications   ALFUZOSIN (UROXATRAL) 10 MG 24 HR TABLET    Take 10 mg by mouth daily with breakfast.   AMLODIPINE (NORVASC) 10 MG TABLET    Take 1 tablet (10 mg total) by mouth daily.   ASPIRIN  81 MG TABLET    Take 1 tablet (81 mg total) by mouth daily.   BACLOFEN (LIORESAL) 10 MG TABLET    TAKE ONE TABLET IN THE MORNING AND MIDDAY, THEN TWO TABLETS AT NIGHT   CYCLOSPORINE (RESTASIS) 0.05 % OPHTHALMIC EMULSION    1 DROP INTO BOTH EYES TWICE A DAY FOR DRY EYES   DOCUSATE SODIUM (COLACE) 100 MG CAPSULE    Take 2 capsules (200 mg total) by mouth 2 (two) times daily.   DONEPEZIL (ARICEPT) 10 MG TABLET    Take 1 tablet (10 mg total) by mouth at bedtime.   FINASTERIDE (PROSCAR) 5 MG TABLET    Take 1 tablet (5 mg total) by mouth daily. Reported on 05/04/2016   FLUTICASONE (FLONASE) 50 MCG/ACT NASAL SPRAY    Place 1 spray into both nostrils daily as needed for allergies.   GABAPENTIN (NEURONTIN) 400 MG CAPSULE    Take 1 capsule (400 mg total) by mouth 3 (three) times daily.   LATANOPROST (XALATAN) 0.005 % OPHTHALMIC SOLUTION    Place 1 drop into both eyes at bedtime.   LOSARTAN (COZAAR) 100 MG TABLET    Take 100 mg by mouth daily.   MELATONIN 10 MG TABS    Take 10 mg by mouth at bedtime.   MEMANTINE (NAMENDA) 10 MG TABLET    Take 1 tablet (10 mg total) by mouth 2 (two)  times daily.   OMEPRAZOLE (PRILOSEC) 20 MG CAPSULE    Take 2 capsules (40 mg total) by mouth daily.   OXYCODONE HCL 10 MG TABS    Take 1 tablet (10 mg total) by mouth every 4 (four) hours as needed (moderate to severe pain).   POLYETHYLENE GLYCOL (MIRALAX / GLYCOLAX) PACKET    Take 17 g by mouth daily.   POTASSIUM CHLORIDE SA (K-DUR,KLOR-CON) 20 MEQ TABLET    Take 1 tablet (20 mEq total) by mouth daily.   PRAVASTATIN (PRAVACHOL) 10 MG TABLET    Take 1 tablet (10 mg total) by mouth every evening.   TRAZODONE (DESYREL) 100 MG TABLET    Take 1 tablet (100 mg total) by mouth at bedtime.   VENLAFAXINE XR (EFFEXOR-XR) 150 MG 24 HR CAPSULE    Take 1 capsule (150 mg total) by mouth 2 (two) times daily.  Modified Medications   No medications on file  Discontinued Medications   No medications on file    Subjective: Cody Velez is in for his  routine follow-up visit.  He had his spinal cord stimulator removed 3 months ago after he developed a pocket infection.  Operative cultures grew MSSA.  He completed 6 weeks of postop cephalexin on 04/08/2020.  He has not had any new pain since stopping antibiotics. Review of Systems: Review of Systems  Constitutional: Negative for chills, diaphoresis and fever.  Gastrointestinal: Negative for abdominal pain, diarrhea, nausea and vomiting.  Musculoskeletal: Positive for back pain and neck pain.  Skin: Negative for rash.    Past Medical History:  Diagnosis Date  . Allergic rhinitis   . Arthritis   . B12 deficiency   . Cervicogenic headache 01/28/2016   no current problems per patient 02/22/20  . Childhood asthma    as a child, no problems as an adult  . Chronic back pain   . Coronary atherosclerosis of native coronary artery    Mild nonobstructive CAD at catheterization January 2015  . Dementia ([place])   . Depression   . Essential hypertension   . Falls   . GERD (gastroesophageal reflux disease)   . Glaucoma   . History of blood transfusion    with a surgery procedure  . History of cerebrovascular disease 07/23/2015  . History of kidney stones    surgery to removed  . History of pneumonia 02/2011  . Hyperlipidemia   . Memory difficulty 07/23/2015  . OSA (obstructive sleep apnea)    CPAP - Dr. [name] - uses cpap every night  . Pneumonia   . Prostate cancer ([place])   . PTSD (post-traumatic stress disorder)    [place]  . Rectal bleeding   . Wheelchair bound     Social History   Tobacco Use  . Smoking status: Former Smoker    Types: Cigarettes    Quit date: 12/20/1958    Years since quitting: 61.4  . Smokeless tobacco: Current User    Types: Chew  Substance Use Topics  . Alcohol use: Not Currently    Alcohol/week: 0.0 standard drinks    Comment: couple bottles of wine per month  . Drug use: No    Types: Marijuana    Comment: "last used marijuana ~ 1969"    Family History    Problem Relation Age of Onset  . Emphysema Father   . Heart failure Father   . Lung cancer Father   . CAD Father   . Colon cancer Mother   . Stroke Mother   .  Breast cancer Mother   . Stroke Sister   . Heart attack Brother   . Dementia Paternal Uncle   . Emphysema Maternal Grandmother   . Stroke Maternal Grandmother   . Asthma Other        grandson  . Heart disease Paternal Grandfather   . Anesthesia problems Neg Hx   . Hypotension Neg Hx   . Malignant hyperthermia Neg Hx   . Pseudochol deficiency Neg Hx     No Known Allergies  Objective: Vitals:   05/20/20 1124  BP: 140/82  Pulse: 64  Temp: 97.6 F (36.4 C)  SpO2: 93%  Weight: (!) 301 lb (136.5 kg)   Body mass index is 37.62 kg/m.  Physical Exam Constitutional:      Comments: He is seated in his electric wheelchair.  He is in good spirits.  Musculoskeletal:     Comments: All surgical incisions are healed.      Lab Results    Problem List Items Addressed This Visit      High   Infection of spinal cord stimulator ([place])    His spinal cord stimulator pocket infection has been cured with removal of the device and 6 weeks of antibiotics.  He can follow-up here as needed.          Michel Bickers, MD [facility] [place] [facility] [phone] pager   [phone] cell 05/20/2020, 11:39 AM

## 2020-05-20 NOTE — Assessment & Plan Note (Signed)
His spinal cord stimulator pocket infection has been cured with removal of the device and 6 weeks of antibiotics.  He can follow-up here as needed.

## 2020-05-22 ENCOUNTER — Other Ambulatory Visit: Payer: Self-pay

## 2020-05-22 ENCOUNTER — Ambulatory Visit (HOSPITAL_COMMUNITY): Payer: No Typology Code available for payment source | Attending: Family | Admitting: Physical Therapy

## 2020-05-22 ENCOUNTER — Ambulatory Visit (HOSPITAL_COMMUNITY): Payer: Non-veteran care | Admitting: Physical Therapy

## 2020-05-22 DIAGNOSIS — M6281 Muscle weakness (generalized): Secondary | ICD-10-CM | POA: Insufficient documentation

## 2020-05-22 DIAGNOSIS — R262 Difficulty in walking, not elsewhere classified: Secondary | ICD-10-CM | POA: Insufficient documentation

## 2020-05-22 DIAGNOSIS — M545 Low back pain, unspecified: Secondary | ICD-10-CM

## 2020-05-22 DIAGNOSIS — R29898 Other symptoms and signs involving the musculoskeletal system: Secondary | ICD-10-CM | POA: Diagnosis present

## 2020-05-22 DIAGNOSIS — R2689 Other abnormalities of gait and mobility: Secondary | ICD-10-CM | POA: Insufficient documentation

## 2020-05-22 DIAGNOSIS — G8929 Other chronic pain: Secondary | ICD-10-CM | POA: Insufficient documentation

## 2020-05-22 NOTE — Addendum Note (Signed)
Addended by: Jerene Pitch R on: 05/22/2020 04:46 PM   Modules accepted: Orders

## 2020-05-22 NOTE — Therapy (Addendum)
[facility] [address] [place], [place], [address] Phone: [phone]   Fax:  [phone]  Physical Therapy Treatment and Recert Progress Note Reporting Period 04/09/2020 to 05/22/2020  See note below for Objective Data and Assessment of Progress/Goals.  ---- RECERT Patient making progress and has met one long term goal at this time. Pain has limited his ability to attend all therapy sessions but plan in place with MD follow up to address cervical pain. Patient would continue to benefit from skilled physical therpay at this time. Extending POC additional 6 weeks at 2x/week to work towards remaining goals.   4:44 PM, 05/22/20 Jerene Pitch, DPT Physical Therapy with [facility]  [phone] office ----    Patient Details  Name: Cody Velez MRN: [id number] Date of Birth: 23-Dec-1945 Referring Provider (PT): Dorothea Ogle   Encounter Date: 05/22/2020  PT End of Session - 05/22/20 1608    Visit Number  8    Number of Visits  20    Date for PT Re-Evaluation  07/03/20   Eval 4/21, PN 6/3   Authorization Type  Primary Medicare, Secondary BCBS, [facility] [place]. 15 VL with VA champ and 75 VL for BCBS no auth needed for [employer], [employer] covered    Authorization - Visit Number  8    Authorization - Number of Visits  15    Progress Note Due on Visit  18    PT Start Time  2334    PT Stop Time  1450    PT Time Calculation (min)  45 min    Equipment Utilized During Treatment  Gait belt    Activity Tolerance  Patient tolerated treatment well;No increased pain    Behavior During Therapy  WFL for tasks assessed/performed       Past Medical History:  Diagnosis Date  . Allergic rhinitis   . Arthritis   . B12 deficiency   . Cervicogenic headache 01/28/2016   no current problems per patient 02/22/20  . Childhood asthma    as a child, no problems as an adult  . Chronic back pain   . Coronary atherosclerosis of native  coronary artery    Mild nonobstructive CAD at catheterization January 2015  . Dementia ([place])   . Depression   . Essential hypertension   . Falls   . GERD (gastroesophageal reflux disease)   . Glaucoma   . History of blood transfusion    with a surgery procedure  . History of cerebrovascular disease 07/23/2015  . History of kidney stones    surgery to removed  . History of pneumonia 02/2011  . Hyperlipidemia   . Memory difficulty 07/23/2015  . OSA (obstructive sleep apnea)    CPAP - Dr. [name] - uses cpap every night  . Pneumonia   . Prostate cancer ([place])   . PTSD (post-traumatic stress disorder)    [place]  . Rectal bleeding   . Wheelchair bound     Past Surgical History:  Procedure Laterality Date  . APPLICATION OF ROBOTIC ASSISTANCE FOR SPINAL PROCEDURE N/A 03/28/2018   Procedure: APPLICATION OF ROBOTIC ASSISTANCE FOR SPINAL PROCEDURE;  Surgeon: Kristeen Miss, MD;  Location: [place];  Service: Neurosurgery;  Laterality: N/A;  . BACK SURGERY  02/14/12   lumbar OR #7; "today redid L1L2; replaced screws; added bone from hip"  . BILATERAL KNEE ARTHROSCOPY    . COLONOSCOPY  10/15/2008   Dr. [name]: tubular adenoma   . COLONOSCOPY  12/17/2003  LKT:GYBWLS rectal and colon  . COLONOSCOPY N/A 09/05/2014   Procedure: COLONOSCOPY;  Surgeon: Daneil Dolin, MD;  [facility];  Service: Endoscopy;  Laterality: N/A;  7:30-rescheduled 9/17 to [place] notified pt  . CYSTOSCOPY WITH STENT PLACEMENT Right 01/27/2016   Procedure: CYSTOSCOPY WITH STENT PLACEMENT;  Surgeon: [name], MD;  [facility];  Service: Urology;  Laterality: Right;  . CYSTOSCOPY/RETROGRADE/URETEROSCOPY/STONE EXTRACTION WITH BASKET Right 01/27/2016   Procedure: CYSTOSCOPY, RIGHT RETROGRADE, RIGHT URETEROSCOPY, STONE EXTRACTION ;  Surgeon: [name], MD;  Location: [facility];  Service: Urology;  Laterality: Right;  . ESOPHAGOGASTRODUODENOSCOPY  10/15/2008     Dr Rourk:Schatzki's ring status post  dilation and disruption via 3 F Maloney dilator/ otherwise unremarkable esophagus, small hiatal hernia, multiple fundal gland polyps not manipulated, gastritis, negative H.pylori  . ESOPHAGOGASTRODUODENOSCOPY  06/21/02   LHT:DSKAJ sliding hiatal hernia with mild changes of reflux esophagitis limited to gastroesophageal junction.  Noncritical ring at distal esophagus, 3 cm proximal to gastroesophageal junction/Antral gastritis  . [place]   "broke face playing softball"  . FRACTURE SURGERY     "left wrist; broke it; took spur off"  . HOLMIUM LASER APPLICATION Right 05/27/1156   Procedure: HOLMIUM LASER APPLICATION;  Surgeon: [name], MD;  [facility];  Service: Urology;  Laterality: Right;  . KNEE ARTHROSCOPY Right 05/18/2018   Procedure: PARTIAL MEDIAL MENISECTOMY AND SURGICAL LAVAGE AND CHONDROPLASTY;  Surgeon: [name], MD;  Location: [facility];  Service: Orthopedics;  Laterality: Right;  . LEFT HEART CATHETERIZATION WITH CORONARY ANGIOGRAM N/A 12/27/2013   Procedure: LEFT HEART CATHETERIZATION WITH CORONARY ANGIOGRAM;  Surgeon: [name], MD;  [facility];  Service: Cardiovascular;  Laterality: N/A;  . neck epidural    . POSTERIOR LUMBAR FUSION 4 LEVEL N/A 03/28/2018   Procedure: Thoracic eight -Lumbar two- FIXATION WITH SCREW PLACEMENT, DECOMPRESSION Thoracic ten-Thoracic eleven  FOR OSTEOMYELITIS;  Surgeon: Kristeen Miss, MD;  Location: [facility];  Service: Neurosurgery;  Laterality: N/A;  . SHOULDER SURGERY Bilateral   . SPINAL CORD STIMULATOR REMOVAL N/A 02/25/2020   Procedure: LUMBAR SPINAL CORD STIMULATOR REMOVAL;  Surgeon: Kristeen Miss, MD;  Location: [facility];  Service: Neurosurgery;  Laterality: N/A;  . TEE WITHOUT CARDIOVERSION N/A 03/21/2018   Procedure: TRANSESOPHAGEAL ECHOCARDIOGRAM (TEE) WITH PROPOFOL;  Surgeon: [name], MD;  [facility];  Service: Cardiovascular;  Laterality: N/A;    There were no vitals filed for  this visit.  Subjective Assessment - 05/22/20 1424    Subjective  Pt states he has felt weak in the knees lately and states his neck has been hurting him some.  STates he has an appointment with [facility] on 6/9 regarding his neck.  States he is walking some at home now, most of 40 feet at once 1-2X daily.    Currently in Pain?  No/denies         [facility] PT Assessment - 05/22/20 0001      Berg Balance Test   Sit to Stand  Able to stand  independently using hands    Standing Unsupported  Able to stand 2 minutes with supervision    Sitting with Back Unsupported but Feet Supported on Floor or Stool  Able to sit safely and securely 2 minutes    Stand to Sit  Controls descent by using hands    Transfers  Able to transfer safely, definite need of hands    Standing Unsupported with Eyes  Closed  Able to stand 10 seconds with supervision    Standing Unsupported with Feet Together  Able to place feet together independently and stand for 1 minute with supervision    From Standing, Reach Forward with Outstretched Arm  Can reach forward >12 cm safely (5")    From Standing Position, Pick up Object from Floor  Unable to try/needs assist to keep balance    From Standing Position, Turn to Look Behind Over each Shoulder  Turn sideways only but maintains balance    Turn 360 Degrees  Needs close supervision or verbal cueing    Standing Unsupported, Alternately Place Feet on Step/Stool  Able to complete >2 steps/needs minimal assist    Standing Unsupported, One Foot in [place] to take small step independently and hold 30 seconds    Standing on One Leg  Able to lift leg independently and hold equal to or more than 3 seconds    Total Score  33    Berg comment:  was 46                    [facility] Adult PT Treatment/Exercise - 05/22/20 0001      Ambulation/Gait   Ambulation/Gait  Yes    Ambulation/Gait Assistance  4: Min assist    Ambulation/Gait Assistance Details  2MWT    Ambulation Distance  (Feet)  168 Feet    Assistive device  Rolling walker      Knee/Hip Exercises: Standing   Hip Flexion  3 sets;10 reps    Hip Flexion Limitations  alternating slow controlled    Hip Abduction  Both;3 sets;10 reps;Knee straight    Other Standing Knee Exercises  step taps forward  in // bars 3x10 B, one UE support - 4"                PT Short Term Goals - 05/22/20 1607      PT SHORT TERM GOAL #1   Title  Patient will be independent in self management strategies to improve quality of life and functional outcomes.    Time  3    Period  Weeks    Status  On-going    Target Date  05/01/20      PT SHORT TERM GOAL #2   Title  Patient will report at least 25% improvement in overall symptoms and/or functional ability.    Time  3    Period  Weeks    Status  On-going    Target Date  05/01/20      PT SHORT TERM GOAL #3   Title  Patient will report or demonstrate ability to perform ambulation for 2 minutes with LRAD with no more than supervision assistance without rest break and reported no more than minimal difficulty.     Time  3    Period  Weeks    Status  On-going    Target Date  05/01/20        PT Long Term Goals - 05/22/20 1607      PT LONG TERM GOAL #1   Title  Patient will report at least 50% improvement in overall symptoms and/or functional ability.    Time  6    Period  Weeks    Status  On-going      PT LONG TERM GOAL #2   Title  Patient will demonstrate ability to stand at a support surface for 10 minutes without a rest break in order to perform household  chores and ADL's with increased ease.     Time  6    Period  Weeks    Status  On-going      PT LONG TERM GOAL #3   Title  Patient will demonstrate ability to ambulate 120 feet without rest break using LRAD with no more than minimal assistance.    Time  6    Period  Weeks    Status  Achieved            Plan - 05/22/20 1534    Clinical Impression Statement  Pt has completed 8 visits of 11 scheduled  with 3 cancellations due to cervical pain.  pt has appointment to address his cervical pain next week and has surgery scheduled for his carpal tunnel the end of this month (6/28).  Pt is now ambulating in his home approx 20-40 feet 1-2X daily and completing his exercises.  Pt has only attended aquatic therapy once due to cancellations/rescheduling conflicts.  Pt has improved his BERG balance score, however his ambulation speed and FOTO score are unchanged from initial evaluation.  Pt is progression towards all goals.  Pt would benefit from continued therapy services to improve strength and functional status.    Personal Factors and Comorbidities  Comorbidity 1;Comorbidity 2;Comorbidity 3+    Comorbidities  multiple lumbar and cervical fusions, paraplegic    Examination-Activity Limitations  Stand;Stairs;Locomotion Level    Examination-Participation Restrictions  Meal Prep;Cleaning;Community Activity    Stability/Clinical Decision Making  Stable/Uncomplicated    Rehab Potential  Good    PT Frequency  2x / week    PT Duration  6 weeks    PT Treatment/Interventions  ADLs/Self Care Home Management;Aquatic Therapy;Cryotherapy;Electrical Stimulation;Moist Heat;Balance training;Therapeutic exercise;Therapeutic activities;Functional mobility training;Stair training;Gait training;DME Instruction;Neuromuscular re-education;Patient/family education;Wheelchair mobility training;Manual techniques;Dry needling;Passive range of motion;Joint Manipulations    PT Next Visit Plan  Continue 2X week for 3 more weeks (1 land session 1 aquatic session).    Pt will be discharged at this point due to having carpal tunnel surgery on the 28th.    PT Home Exercise Plan  4/22 bridges    Consulted and Agree with Plan of Care  Patient       Patient will benefit from skilled therapeutic intervention in order to improve the following deficits and impairments:  Abnormal gait, Decreased endurance, Impaired sensation, Pain, Decreased  strength, Decreased activity tolerance, Decreased balance, Decreased mobility, Difficulty walking, Decreased range of motion, Postural dysfunction, Improper body mechanics  Visit Diagnosis: Difficulty in walking, not elsewhere classified  Muscle weakness (generalized)  Chronic midline low back pain without sciatica     Problem List Patient Active Problem List   Diagnosis Date Noted  . Infection of spinal cord stimulator ([place]) 02/26/2020  . Pseudogout of knee   . Pyogenic arthritis of right knee joint ([place])   . Falls frequently 05/08/2018  . Fall   . Sleep disturbance   . Post-op pain   . Hypokalemia   . Morbid obesity ([place])   . Postoperative pain   . DVT, lower extremity, distal, acute, bilateral ([place])   . Acute blood loss anemia   . Anemia of chronic disease   . Essential hypertension   . Bacteremia   . Myelopathy ([place]) 03/30/2018  . Paraplegia ([place])   . Postlaminectomy syndrome, lumbar region   . Epidural abscess   . Abdominal distension   . Encephalopathy   . Weakness of both lower extremities   . Discitis of thoracic region   .  Spinal cord stimulator status   . Staphylococcus aureus sepsis ([place]) 03/20/2018  . Bacteremia due to methicillin susceptible Staphylococcus aureus (MSSA) 03/18/2018  . Obesity 03/18/2018  . Nephrolithiasis 03/16/2018  . AKI (acute kidney injury) ([place]) 03/16/2018  . Cognitive impairment 03/16/2018  . Chronic pain syndrome 03/16/2018  . Cervicogenic headache 01/28/2016  . B12 deficiency 01/28/2016  . Memory difficulty 07/23/2015  . History of cerebrovascular disease 07/23/2015  . FH: colon cancer 08/13/2014  . Hx of adenomatous colonic polyps 08/13/2014  . Chronic back pain 05/30/2012  . CAD, NATIVE VESSEL 07/17/2010  . HYPERCHOLESTEROLEMIA 10/31/2009  . SMOKELESS TOBACCO ABUSE 10/31/2009  . Obstructive sleep apnea 10/31/2009  . Essential hypertension, benign 10/31/2009  . GERD 10/31/2009   Teena Irani,  PTA/CLT [phone]  Teena Irani 05/22/2020, 4:09 PM  [facility] [address] [place], [place], [address] Phone: [phone]   Fax:  [phone]  Name: CRIAG WICKLUND MRN: [id number] Date of Birth: 1946/05/20

## 2020-05-27 ENCOUNTER — Ambulatory Visit (HOSPITAL_COMMUNITY): Payer: No Typology Code available for payment source | Admitting: Physical Therapy

## 2020-05-27 ENCOUNTER — Telehealth (HOSPITAL_COMMUNITY): Payer: Self-pay | Admitting: Physical Therapy

## 2020-05-27 NOTE — Telephone Encounter (Signed)
pt called to cx today's appt due to his neck. pt is going to see the neurosurgeon.

## 2020-05-28 DIAGNOSIS — G8220 Paraplegia, unspecified: Secondary | ICD-10-CM | POA: Diagnosis not present

## 2020-05-28 DIAGNOSIS — M71349 Other bursal cyst, unspecified hand: Secondary | ICD-10-CM | POA: Diagnosis not present

## 2020-05-29 ENCOUNTER — Ambulatory Visit (HOSPITAL_COMMUNITY): Payer: No Typology Code available for payment source | Admitting: Physical Therapy

## 2020-05-29 ENCOUNTER — Telehealth (HOSPITAL_COMMUNITY): Payer: Self-pay | Admitting: Physical Therapy

## 2020-05-29 NOTE — Telephone Encounter (Signed)
PT CALLED TO CX TODAY'S APPT COULD NOT GET HIS CAR DOOR CLOSED

## 2020-06-03 ENCOUNTER — Ambulatory Visit (HOSPITAL_COMMUNITY): Payer: No Typology Code available for payment source | Admitting: Physical Therapy

## 2020-06-03 ENCOUNTER — Other Ambulatory Visit: Payer: Self-pay

## 2020-06-03 DIAGNOSIS — R2689 Other abnormalities of gait and mobility: Secondary | ICD-10-CM

## 2020-06-03 DIAGNOSIS — R262 Difficulty in walking, not elsewhere classified: Secondary | ICD-10-CM

## 2020-06-03 DIAGNOSIS — M6281 Muscle weakness (generalized): Secondary | ICD-10-CM

## 2020-06-03 DIAGNOSIS — G8929 Other chronic pain: Secondary | ICD-10-CM

## 2020-06-03 DIAGNOSIS — R29898 Other symptoms and signs involving the musculoskeletal system: Secondary | ICD-10-CM

## 2020-06-03 NOTE — Therapy (Signed)
[place], [place], [address] Phone: [phone]   Fax:  [phone]  Physical Therapy Treatment  Patient Details  Name: Cody Velez MRN: [id number] Date of Birth: 1946-06-30 Referring Provider (PT): Dorothea Ogle   Encounter Date: 06/03/2020   PT End of Session - 06/03/20 1358    Visit Number 9    Number of Visits 20    Date for PT Re-Evaluation 07/03/20   Eval 4/21, PN 6/3   Authorization Type Primary Medicare, Secondary BCBS, [facility]. 15 VL with VA champ and 75 VL for BCBS no auth needed for [employer], aquatics covered    Authorization - Visit Number 9    Authorization - Number of Visits 15    Progress Note Due on Visit 18    PT Start Time 2355    PT Stop Time 1403    PT Time Calculation (min) 41 min    Equipment Utilized During Treatment Gait belt    Activity Tolerance Patient tolerated treatment well;No increased pain    Behavior During Therapy WFL for tasks assessed/performed           Past Medical History:  Diagnosis Date  . Allergic rhinitis   . Arthritis   . B12 deficiency   . Cervicogenic headache 01/28/2016   no current problems per patient 02/22/20  . Childhood asthma    as a child, no problems as an adult  . Chronic back pain   . Coronary atherosclerosis of native coronary artery    Mild nonobstructive CAD at catheterization January 2015  . Dementia ([place])   . Depression   . Essential hypertension   . Falls   . GERD (gastroesophageal reflux disease)   . Glaucoma   . History of blood transfusion    with a surgery procedure  . History of cerebrovascular disease 07/23/2015  . History of kidney stones    surgery to removed  . History of pneumonia 02/2011  . Hyperlipidemia   . Memory difficulty 07/23/2015  . OSA (obstructive sleep apnea)    CPAP - Dr. [name] - uses cpap every night  . Pneumonia   . Prostate cancer ([place])   . PTSD (post-traumatic stress disorder)    [place]  .  Rectal bleeding   . Wheelchair bound     Past Surgical History:  Procedure Laterality Date  . APPLICATION OF ROBOTIC ASSISTANCE FOR SPINAL PROCEDURE N/A 03/28/2018   Procedure: APPLICATION OF ROBOTIC ASSISTANCE FOR SPINAL PROCEDURE;  Surgeon: Kristeen Miss, MD;  Location: [facility];  Service: Neurosurgery;  Laterality: N/A;  . BACK SURGERY  02/14/12   lumbar OR #7; "today redid L1L2; replaced screws; added bone from hip"  . BILATERAL KNEE ARTHROSCOPY    . COLONOSCOPY  10/15/2008   Dr. [name]: tubular adenoma   . COLONOSCOPY  12/17/2003   DDU:KGURKY rectal and colon  . COLONOSCOPY N/A 09/05/2014   Procedure: COLONOSCOPY;  Surgeon: Daneil Dolin, MD;  [facility];  Service: Endoscopy;  Laterality: N/A;  7:30-rescheduled 9/17 to [place] notified pt  . CYSTOSCOPY WITH STENT PLACEMENT Right 01/27/2016   Procedure: CYSTOSCOPY WITH STENT PLACEMENT;  Surgeon: [name], MD;  [facility];  Service: Urology;  Laterality: Right;  . CYSTOSCOPY/RETROGRADE/URETEROSCOPY/STONE EXTRACTION WITH BASKET Right 01/27/2016   Procedure: CYSTOSCOPY, RIGHT RETROGRADE, RIGHT URETEROSCOPY, STONE EXTRACTION ;  Surgeon: [name], MD;  [facility];  Service: Urology;  Laterality: Right;  . ESOPHAGOGASTRODUODENOSCOPY  10/15/2008  Dr Rourk:Schatzki's ring status post dilation and disruption via 43 F Maloney dilator/ otherwise unremarkable esophagus, small hiatal hernia, multiple fundal gland polyps not manipulated, gastritis, negative H.pylori  . ESOPHAGOGASTRODUODENOSCOPY  06/21/02   ATF:TDDUK sliding hiatal hernia with mild changes of reflux esophagitis limited to gastroesophageal junction.  Noncritical ring at distal esophagus, 3 cm proximal to gastroesophageal junction/Antral gastritis  . [place]   "broke face playing softball"  . FRACTURE SURGERY     "left wrist; broke it; took spur off"  . HOLMIUM LASER APPLICATION Right 0/01/5426   Procedure: HOLMIUM  LASER APPLICATION;  Surgeon: [name], MD;  [facility];  Service: Urology;  Laterality: Right;  . KNEE ARTHROSCOPY Right 05/18/2018   Procedure: PARTIAL MEDIAL MENISECTOMY AND SURGICAL LAVAGE AND CHONDROPLASTY;  Surgeon: [name], MD;  Location: [facility];  Service: Orthopedics;  Laterality: Right;  . LEFT HEART CATHETERIZATION WITH CORONARY ANGIOGRAM N/A 12/27/2013   Procedure: LEFT HEART CATHETERIZATION WITH CORONARY ANGIOGRAM;  Surgeon: [name], MD;  Location: [facility] C[facility] LAB;  Service: Cardiovascular;  Laterality: N/A;  . neck epidural    . POSTERIOR LUMBAR FUSION 4 LEVEL N/A 03/28/2018   Procedure: Thoracic eight -Lumbar two- FIXATION WITH SCREW PLACEMENT, DECOMPRESSION Thoracic ten-Thoracic eleven  FOR OSTEOMYELITIS;  Surgeon: Kristeen Miss, MD;  Location: [place];  Service: Neurosurgery;  Laterality: N/A;  . SHOULDER SURGERY Bilateral   . SPINAL CORD STIMULATOR REMOVAL N/A 02/25/2020   Procedure: LUMBAR SPINAL CORD STIMULATOR REMOVAL;  Surgeon: Kristeen Miss, MD;  Location: [place];  Service: Neurosurgery;  Laterality: N/A;  . TEE WITHOUT CARDIOVERSION N/A 03/21/2018   Procedure: TRANSESOPHAGEAL ECHOCARDIOGRAM (TEE) WITH PROPOFOL;  Surgeon: [name], MD;  [facility];  Service: Cardiovascular;  Laterality: N/A;    There were no vitals filed for this visit.   Subjective Assessment - 06/03/20 1346    Subjective pt states he is not feeling the best, his legs are weak today. STates he did not come last week because of his neck.  STatess MD put him on some new meds for his neck last week (some type of antiflammatory).    Currently in Pain? No/denies                             [facility] Adult PT Treatment/Exercise - 06/03/20 0001      Ambulation/Gait   Ambulation/Gait Yes    Ambulation/Gait Assistance 4: Min assist;3: Mod assist    Assistive device Rolling walker      Knee/Hip Exercises: Standing   Hip Flexion 10 reps;2 sets    Hip Flexion  Limitations alternating slow controlled    Hip Abduction Both;10 reps;Knee straight;2 sets    Other Standing Knee Exercises step taps forward  in // bars 2x10 B, one UE support - 4"                     PT Short Term Goals - 05/22/20 1607      PT SHORT TERM GOAL #1   Title Patient will be independent in self management strategies to improve quality of life and functional outcomes.    Time 3    Period Weeks    Status On-going    Target Date 05/01/20      PT SHORT TERM GOAL #2   Title Patient will report at least 25% improvement in overall symptoms and/or functional ability.    Time  3    Period Weeks    Status On-going    Target Date 05/01/20      PT SHORT TERM GOAL #3   Title Patient will report or demonstrate ability to perform ambulation for 2 minutes with LRAD with no more than supervision assistance without rest break and reported no more than minimal difficulty.     Time 3    Period Weeks    Status On-going    Target Date 05/01/20             PT Long Term Goals - 05/22/20 1607      PT LONG TERM GOAL #1   Title Patient will report at least 50% improvement in overall symptoms and/or functional ability.    Time 6    Period Weeks    Status On-going      PT LONG TERM GOAL #2   Title Patient will demonstrate ability to stand at a support surface for 10 minutes without a rest break in order to perform household chores and ADL's with increased ease.     Time 6    Period Weeks    Status On-going      PT LONG TERM GOAL #3   Title Patient will demonstrate ability to ambulate 120 feet without rest break using LRAD with no more than minimal assistance.    Time 6    Period Weeks    Status Achieved                 Plan - 06/03/20 1406    Clinical Impression Statement Pt returns today voicing more buckling in knees/weakness, however reports compliance with HEP at home and walking as usual.  States his pool is ready at home for him to get in but hasn't yet.   Pt unable to complete full ambulation or sets of therex today due to fatigue.  2 sets today instead of 3 and only able to ambulate 175 feet before needing a rest and requested not to attempt another round of ambulation.  pt required more assist today as well due to buckling of knees and general inability to keep them extended and upright posturing.    Personal Factors and Comorbidities Comorbidity 1;Comorbidity 2;Comorbidity 3+    Comorbidities multiple lumbar and cervical fusions, paraplegic    Examination-Activity Limitations Stand;Stairs;Locomotion Level    Examination-Participation Restrictions Meal Prep;Cleaning;Community Activity    Stability/Clinical Decision Making Stable/Uncomplicated    Rehab Potential Good    PT Frequency 2x / week    PT Duration 6 weeks    PT Treatment/Interventions ADLs/Self Care Home Management;Aquatic Therapy;Cryotherapy;Electrical Stimulation;Moist Heat;Balance training;Therapeutic exercise;Therapeutic activities;Functional mobility training;Stair training;Gait training;DME Instruction;Neuromuscular re-education;Patient/family education;Wheelchair mobility training;Manual techniques;Dry needling;Passive range of motion;Joint Manipulations    PT Next Visit Plan Continue 2X week for 2 more weeks (1 land session 1 aquatic session).    Pt will be discharged at this point due to having carpal tunnel surgery on the 28th.    PT Home Exercise Plan 4/22 bridges    Consulted and Agree with Plan of Care Patient           Patient will benefit from skilled therapeutic intervention in order to improve the following deficits and impairments:  Abnormal gait, Decreased endurance, Impaired sensation, Pain, Decreased strength, Decreased activity tolerance, Decreased balance, Decreased mobility, Difficulty walking, Decreased range of motion, Postural dysfunction, Improper body mechanics  Visit Diagnosis: Muscle weakness (generalized)  Difficulty in walking, not elsewhere  classified  Chronic midline low back pain  without sciatica  Other abnormalities of gait and mobility  Other symptoms and signs involving the musculoskeletal system     Problem List Patient Active Problem List   Diagnosis Date Noted  . Infection of spinal cord stimulator ([place]) 02/26/2020  . Pseudogout of knee   . Pyogenic arthritis of right knee joint ([place])   . Falls frequently 05/08/2018  . Fall   . Sleep disturbance   . Post-op pain   . Hypokalemia   . Morbid obesity ([place])   . Postoperative pain   . DVT, lower extremity, distal, acute, bilateral ([place])   . Acute blood loss anemia   . Anemia of chronic disease   . Essential hypertension   . Bacteremia   . Myelopathy ([place]) 03/30/2018  . Paraplegia ([place])   . Postlaminectomy syndrome, lumbar region   . Epidural abscess   . Abdominal distension   . Encephalopathy   . Weakness of both lower extremities   . Discitis of thoracic region   . Spinal cord stimulator status   . Staphylococcus aureus sepsis ([place]) 03/20/2018  . Bacteremia due to methicillin susceptible Staphylococcus aureus (MSSA) 03/18/2018  . Obesity 03/18/2018  . Nephrolithiasis 03/16/2018  . AKI (acute kidney injury) ([place]) 03/16/2018  . Cognitive impairment 03/16/2018  . Chronic pain syndrome 03/16/2018  . Cervicogenic headache 01/28/2016  . B12 deficiency 01/28/2016  . Memory difficulty 07/23/2015  . History of cerebrovascular disease 07/23/2015  . FH: colon cancer 08/13/2014  . Hx of adenomatous colonic polyps 08/13/2014  . Chronic back pain 05/30/2012  . CAD, NATIVE VESSEL 07/17/2010  . HYPERCHOLESTEROLEMIA 10/31/2009  . SMOKELESS TOBACCO ABUSE 10/31/2009  . Obstructive sleep apnea 10/31/2009  . Essential hypertension, benign 10/31/2009  . GERD 10/31/2009   Teena Irani, PTA/CLT [id number]  Teena Irani 06/03/2020, 2:10 PM  [facility] [address] [place], [place], [address] Phone: [phone]    Fax:  [phone]  Name: Cody Velez MRN: [id number] Date of Birth: 07/29/1946

## 2020-06-05 ENCOUNTER — Other Ambulatory Visit: Payer: Self-pay

## 2020-06-05 ENCOUNTER — Encounter (HOSPITAL_COMMUNITY): Payer: Self-pay | Admitting: Physical Therapy

## 2020-06-05 ENCOUNTER — Ambulatory Visit (HOSPITAL_COMMUNITY): Payer: No Typology Code available for payment source | Admitting: Physical Therapy

## 2020-06-05 DIAGNOSIS — R29898 Other symptoms and signs involving the musculoskeletal system: Secondary | ICD-10-CM

## 2020-06-05 DIAGNOSIS — M545 Low back pain, unspecified: Secondary | ICD-10-CM

## 2020-06-05 DIAGNOSIS — G8929 Other chronic pain: Secondary | ICD-10-CM

## 2020-06-05 DIAGNOSIS — R2689 Other abnormalities of gait and mobility: Secondary | ICD-10-CM

## 2020-06-05 DIAGNOSIS — R262 Difficulty in walking, not elsewhere classified: Secondary | ICD-10-CM

## 2020-06-05 DIAGNOSIS — M6281 Muscle weakness (generalized): Secondary | ICD-10-CM

## 2020-06-05 NOTE — Therapy (Signed)
[place], [place], [address] Phone: [phone]   Fax:  [phone]  Physical Therapy Treatment  Patient Details  Name: Cody Velez MRN: [id number] Date of Birth: August 22, 1946 Referring Provider (PT): Dorothea Ogle   Encounter Date: 06/05/2020   PT End of Session - 06/05/20 1453    Visit Number 10    Number of Visits 20    Date for PT Re-Evaluation 07/03/20   Eval 4/21, PN 6/3   Authorization Type Primary Medicare, Secondary BCBS, [facility]. 15 VL with VA champ and 75 VL for BCBS no auth needed for [employer], [employer] covered    Authorization - Visit Number 10    Authorization - Number of Visits 15    Progress Note Due on Visit 18    PT Start Time 1455    PT Stop Time 1545    PT Time Calculation (min) 50 min    Equipment Utilized During Treatment Gait belt    Activity Tolerance Patient tolerated treatment well    Behavior During Therapy WFL for tasks assessed/performed           Past Medical History:  Diagnosis Date  . Allergic rhinitis   . Arthritis   . B12 deficiency   . Cervicogenic headache 01/28/2016   no current problems per patient 02/22/20  . Childhood asthma    as a child, no problems as an adult  . Chronic back pain   . Coronary atherosclerosis of native coronary artery    Mild nonobstructive CAD at catheterization January 2015  . Dementia ([place])   . Depression   . Essential hypertension   . Falls   . GERD (gastroesophageal reflux disease)   . Glaucoma   . History of blood transfusion    with a surgery procedure  . History of cerebrovascular disease 07/23/2015  . History of kidney stones    surgery to removed  . History of pneumonia 02/2011  . Hyperlipidemia   . Memory difficulty 07/23/2015  . OSA (obstructive sleep apnea)    CPAP - Dr. [name] - uses cpap every night  . Pneumonia   . Prostate cancer ([place])   . PTSD (post-traumatic stress disorder)    [place]  . Rectal bleeding   .  Wheelchair bound     Past Surgical History:  Procedure Laterality Date  . APPLICATION OF ROBOTIC ASSISTANCE FOR SPINAL PROCEDURE N/A 03/28/2018   Procedure: APPLICATION OF ROBOTIC ASSISTANCE FOR SPINAL PROCEDURE;  Surgeon: Kristeen Miss, MD;  Location: [place];  Service: Neurosurgery;  Laterality: N/A;  . BACK SURGERY  02/14/12   lumbar OR #7; "today redid L1L2; replaced screws; added bone from hip"  . BILATERAL KNEE ARTHROSCOPY    . COLONOSCOPY  10/15/2008   Dr. [name]: tubular adenoma   . COLONOSCOPY  12/17/2003   LZJ:QBHALP rectal and colon  . COLONOSCOPY N/A 09/05/2014   Procedure: COLONOSCOPY;  Surgeon: Daneil Dolin, MD;  [facility];  Service: Endoscopy;  Laterality: N/A;  7:30-rescheduled 9/17 to [place] notified pt  . CYSTOSCOPY WITH STENT PLACEMENT Right 01/27/2016   Procedure: CYSTOSCOPY WITH STENT PLACEMENT;  Surgeon: [name], MD;  [facility];  Service: Urology;  Laterality: Right;  . CYSTOSCOPY/RETROGRADE/URETEROSCOPY/STONE EXTRACTION WITH BASKET Right 01/27/2016   Procedure: CYSTOSCOPY, RIGHT RETROGRADE, RIGHT URETEROSCOPY, STONE EXTRACTION ;  Surgeon: [name], MD;  [facility];  Service: Urology;  Laterality: Right;  . ESOPHAGOGASTRODUODENOSCOPY  10/15/2008  Dr Rourk:Schatzki's ring status post dilation and disruption via 37 F Maloney dilator/ otherwise unremarkable esophagus, small hiatal hernia, multiple fundal gland polyps not manipulated, gastritis, negative H.pylori  . ESOPHAGOGASTRODUODENOSCOPY  06/21/02   GHW:EXHBZ sliding hiatal hernia with mild changes of reflux esophagitis limited to gastroesophageal junction.  Noncritical ring at distal esophagus, 3 cm proximal to gastroesophageal junction/Antral gastritis  . [place]   "broke face playing softball"  . FRACTURE SURGERY     "left wrist; broke it; took spur off"  . HOLMIUM LASER APPLICATION Right 12/25/9676   Procedure: HOLMIUM LASER APPLICATION;   Surgeon: [name], MD;  [facility];  Service: Urology;  Laterality: Right;  . KNEE ARTHROSCOPY Right 05/18/2018   Procedure: PARTIAL MEDIAL MENISECTOMY AND SURGICAL LAVAGE AND CHONDROPLASTY;  Surgeon: [name], MD;  Location: [facility];  Service: Orthopedics;  Laterality: Right;  . LEFT HEART CATHETERIZATION WITH CORONARY ANGIOGRAM N/A 12/27/2013   Procedure: LEFT HEART CATHETERIZATION WITH CORONARY ANGIOGRAM;  Surgeon: [name], MD;  Location: [facility] CATH LAB;  Service: Cardiovascular;  Laterality: N/A;  . neck epidural    . POSTERIOR LUMBAR FUSION 4 LEVEL N/A 03/28/2018   Procedure: Thoracic eight -Lumbar two- FIXATION WITH SCREW PLACEMENT, DECOMPRESSION Thoracic ten-Thoracic eleven  FOR OSTEOMYELITIS;  Surgeon: Kristeen Miss, MD;  Location: [facility];  Service: Neurosurgery;  Laterality: N/A;  . SHOULDER SURGERY Bilateral   . SPINAL CORD STIMULATOR REMOVAL N/A 02/25/2020   Procedure: LUMBAR SPINAL CORD STIMULATOR REMOVAL;  Surgeon: Kristeen Miss, MD;  Location: [place];  Service: Neurosurgery;  Laterality: N/A;  . TEE WITHOUT CARDIOVERSION N/A 03/21/2018   Procedure: TRANSESOPHAGEAL ECHOCARDIOGRAM (TEE) WITH PROPOFOL;  Surgeon: [name], MD;  [facility];  Service: Cardiovascular;  Laterality: N/A;    There were no vitals filed for this visit.   Subjective Assessment - 06/05/20 1551    Subjective Patient says his neck is feeling much better, started new medication which has been helping. Reports ongoing weakness in legs and lumbar pain    Currently in Pain? Yes    Pain Score 3     Pain Location Back    Pain Orientation Posterior;Lower    Pain Descriptors / Indicators Aching;Throbbing    Pain Type Chronic pain    Pain Onset More than a month ago    Pain Frequency Constant                         Adult Aquatic Therapy - 06/05/20 1557      Treatment   Gait pool walking, sidestepping, retro walk, asll using pool noodle for balance 30' 4RT each      Exercises mini squats (free floating using pool noodle and CG for stability) x20, heel raises at wall x20, standing hip abduction x20 each, semi tandem stance 2 x 30" each, lumbar traction using float 5 x 60"                        PT Short Term Goals - 05/22/20 1607      PT SHORT TERM GOAL #1   Title Patient will be independent in self management strategies to improve quality of life and functional outcomes.    Time 3    Period Weeks    Status On-going    Target Date 05/01/20      PT SHORT TERM GOAL #2   Title Patient will report at least  25% improvement in overall symptoms and/or functional ability.    Time 3    Period Weeks    Status On-going    Target Date 05/01/20      PT SHORT TERM GOAL #3   Title Patient will report or demonstrate ability to perform ambulation for 2 minutes with LRAD with no more than supervision assistance without rest break and reported no more than minimal difficulty.     Time 3    Period Weeks    Status On-going    Target Date 05/01/20             PT Long Term Goals - 05/22/20 1607      PT LONG TERM GOAL #1   Title Patient will report at least 50% improvement in overall symptoms and/or functional ability.    Time 6    Period Weeks    Status On-going      PT LONG TERM GOAL #2   Title Patient will demonstrate ability to stand at a support surface for 10 minutes without a rest break in order to perform household chores and ADL's with increased ease.     Time 6    Period Weeks    Status On-going      PT LONG TERM GOAL #3   Title Patient will demonstrate ability to ambulate 120 feet without rest break using LRAD with no more than minimal assistance.    Time 6    Period Weeks    Status Achieved                 Plan - 06/05/20 1553    Clinical Impression Statement Patient tolerated session well overall today. Was able to complete more functional activity in gravity minimized setting in pool. Patient conitnues to demo  limitation with amount of time in weightbearing due to increased lumbar pain, but improved versus land. Patient well challenged with balance activity and requires verbal and tactile cues for foot placement as well as min gaurd for stability. Patient reports slight increase in lumbar pain when standing prolonged periods, compared to baseline, but is able to acheive intermittent relief using flotation device for lumbar traction. Patient will conitnue to benefit from skilled therapy services to progress LE strength and balance to improve functional mobility and reduce risk for falls.    Personal Factors and Comorbidities Comorbidity 1;Comorbidity 2;Comorbidity 3+    Comorbidities multiple lumbar and cervical fusions, paraplegic    Examination-Activity Limitations Stand;Stairs;Locomotion Level    Examination-Participation Restrictions Meal Prep;Cleaning;Community Activity    Stability/Clinical Decision Making Stable/Uncomplicated    Rehab Potential Good    PT Frequency 2x / week    PT Duration 6 weeks    PT Treatment/Interventions ADLs/Self Care Home Management;Aquatic Therapy;Cryotherapy;Electrical Stimulation;Moist Heat;Balance training;Therapeutic exercise;Therapeutic activities;Functional mobility training;Stair training;Gait training;DME Instruction;Neuromuscular re-education;Patient/family education;Wheelchair mobility training;Manual techniques;Dry needling;Passive range of motion;Joint Manipulations    PT Next Visit Plan Continue 2X week for 2 more weeks (1 land session 1 aquatic session).    Pt will be discharged at this point due to having carpal tunnel surgery on the 28th.    PT Home Exercise Plan 4/22 bridges    Consulted and Agree with Plan of Care Patient           Patient will benefit from skilled therapeutic intervention in order to improve the following deficits and impairments:  Abnormal gait, Decreased endurance, Impaired sensation, Pain, Decreased strength, Decreased activity  tolerance, Decreased balance, Decreased mobility, Difficulty walking, Decreased range of motion,  Postural dysfunction, Improper body mechanics  Visit Diagnosis: Muscle weakness (generalized)  Difficulty in walking, not elsewhere classified  Chronic midline low back pain without sciatica  Other abnormalities of gait and mobility  Other symptoms and signs involving the musculoskeletal system     Problem List Patient Active Problem List   Diagnosis Date Noted  . Infection of spinal cord stimulator ([place]) 02/26/2020  . Pseudogout of knee   . Pyogenic arthritis of right knee joint ([place])   . Falls frequently 05/08/2018  . Fall   . Sleep disturbance   . Post-op pain   . Hypokalemia   . Morbid obesity ([place])   . Postoperative pain   . DVT, lower extremity, distal, acute, bilateral ([place])   . Acute blood loss anemia   . Anemia of chronic disease   . Essential hypertension   . Bacteremia   . Myelopathy ([place]) 03/30/2018  . Paraplegia ([place])   . Postlaminectomy syndrome, lumbar region   . Epidural abscess   . Abdominal distension   . Encephalopathy   . Weakness of both lower extremities   . Discitis of thoracic region   . Spinal cord stimulator status   . Staphylococcus aureus sepsis ([place]) 03/20/2018  . Bacteremia due to methicillin susceptible Staphylococcus aureus (MSSA) 03/18/2018  . Obesity 03/18/2018  . Nephrolithiasis 03/16/2018  . AKI (acute kidney injury) ([place]) 03/16/2018  . Cognitive impairment 03/16/2018  . Chronic pain syndrome 03/16/2018  . Cervicogenic headache 01/28/2016  . B12 deficiency 01/28/2016  . Memory difficulty 07/23/2015  . History of cerebrovascular disease 07/23/2015  . FH: colon cancer 08/13/2014  . Hx of adenomatous colonic polyps 08/13/2014  . Chronic back pain 05/30/2012  . CAD, NATIVE VESSEL 07/17/2010  . HYPERCHOLESTEROLEMIA 10/31/2009  . SMOKELESS TOBACCO ABUSE 10/31/2009  . Obstructive sleep apnea 10/31/2009  . Essential hypertension,  benign 10/31/2009  . GERD 10/31/2009    4:01 PM, 06/05/20 Josue Hector PT DPT  Physical Therapist with [facility]  [phone] [place] [address] [place], [place], [address] Phone: [phone]   Fax:  [phone]  Name: Cody Velez MRN: [id number] Date of Birth: 1946/05/22

## 2020-06-10 ENCOUNTER — Telehealth (HOSPITAL_COMMUNITY): Payer: Self-pay | Admitting: Physical Therapy

## 2020-06-10 ENCOUNTER — Ambulatory Visit (HOSPITAL_COMMUNITY): Payer: No Typology Code available for payment source | Admitting: Physical Therapy

## 2020-06-10 NOTE — Telephone Encounter (Signed)
pt called to cx this appt due to he has to take his wife to a doctor since she fell.

## 2020-06-12 ENCOUNTER — Encounter (HOSPITAL_COMMUNITY): Payer: Self-pay | Admitting: Physical Therapy

## 2020-06-12 ENCOUNTER — Other Ambulatory Visit: Payer: Self-pay

## 2020-06-12 ENCOUNTER — Ambulatory Visit (HOSPITAL_COMMUNITY): Payer: No Typology Code available for payment source | Admitting: Physical Therapy

## 2020-06-12 DIAGNOSIS — G8929 Other chronic pain: Secondary | ICD-10-CM

## 2020-06-12 DIAGNOSIS — R262 Difficulty in walking, not elsewhere classified: Secondary | ICD-10-CM

## 2020-06-12 DIAGNOSIS — M545 Low back pain, unspecified: Secondary | ICD-10-CM

## 2020-06-12 DIAGNOSIS — R2689 Other abnormalities of gait and mobility: Secondary | ICD-10-CM

## 2020-06-12 DIAGNOSIS — R29898 Other symptoms and signs involving the musculoskeletal system: Secondary | ICD-10-CM

## 2020-06-12 DIAGNOSIS — M6281 Muscle weakness (generalized): Secondary | ICD-10-CM

## 2020-06-12 NOTE — Therapy (Signed)
[place], [place], [address] Phone: [phone]   Fax:  [phone]  Physical Therapy Treatment  Patient Details  Name: Cody Velez MRN: [id number] Date of Birth: 1945/12/29 Referring Provider (PT): Dorothea Ogle   Encounter Date: 06/12/2020   PT End of Session - 06/12/20 0955    Visit Number 11    Number of Visits 20    Date for PT Re-Evaluation 07/03/20   Eval 4/21, PN 6/3   Authorization Type Primary Medicare, Secondary BCBS, [facility] [place]. 15 VL with VA champ and 75 VL for BCBS no auth needed for [employer], aquatics covered    Authorization - Visit Number 11    Authorization - Number of Visits 15    Progress Note Due on Visit 18    PT Start Time [phone]    PT Stop Time 1030    PT Time Calculation (min) 42 min    Equipment Utilized During Treatment Gait belt    Activity Tolerance Patient tolerated treatment well;Patient limited by fatigue    Behavior During Therapy WFL for tasks assessed/performed           Past Medical History:  Diagnosis Date  . Allergic rhinitis   . Arthritis   . B12 deficiency   . Cervicogenic headache 01/28/2016   no current problems per patient 02/22/20  . Childhood asthma    as a child, no problems as an adult  . Chronic back pain   . Coronary atherosclerosis of native coronary artery    Mild nonobstructive CAD at catheterization January 2015  . Dementia ([place])   . Depression   . Essential hypertension   . Falls   . GERD (gastroesophageal reflux disease)   . Glaucoma   . History of blood transfusion    with a surgery procedure  . History of cerebrovascular disease 07/23/2015  . History of kidney stones    surgery to removed  . History of pneumonia 02/2011  . Hyperlipidemia   . Memory difficulty 07/23/2015  . OSA (obstructive sleep apnea)    CPAP - Dr. [name] - uses cpap every night  . Pneumonia   . Prostate cancer ([place])   . PTSD (post-traumatic stress disorder)     [place]  . Rectal bleeding   . Wheelchair bound     Past Surgical History:  Procedure Laterality Date  . APPLICATION OF ROBOTIC ASSISTANCE FOR SPINAL PROCEDURE N/A 03/28/2018   Procedure: APPLICATION OF ROBOTIC ASSISTANCE FOR SPINAL PROCEDURE;  Surgeon: Kristeen Miss, MD;  Location: [place];  Service: Neurosurgery;  Laterality: N/A;  . BACK SURGERY  02/14/12   lumbar OR #7; "today redid L1L2; replaced screws; added bone from hip"  . BILATERAL KNEE ARTHROSCOPY    . COLONOSCOPY  10/15/2008   Dr. [name]: tubular adenoma   . COLONOSCOPY  12/17/2003   DQQ:IWLNLG rectal and colon  . COLONOSCOPY N/A 09/05/2014   Procedure: COLONOSCOPY;  Surgeon: Daneil Dolin, MD;  [facility];  Service: Endoscopy;  Laterality: N/A;  7:30-rescheduled 9/17 to [place] notified pt  . CYSTOSCOPY WITH STENT PLACEMENT Right 01/27/2016   Procedure: CYSTOSCOPY WITH STENT PLACEMENT;  Surgeon: [name], MD;  [facility];  Service: Urology;  Laterality: Right;  . CYSTOSCOPY/RETROGRADE/URETEROSCOPY/STONE EXTRACTION WITH BASKET Right 01/27/2016   Procedure: CYSTOSCOPY, RIGHT RETROGRADE, RIGHT URETEROSCOPY, STONE EXTRACTION ;  Surgeon: [name], MD;  [facility];  Service: Urology;  Laterality: Right;  . ESOPHAGOGASTRODUODENOSCOPY  10/15/2008  Dr Rourk:Schatzki's ring status post dilation and disruption via 38 F Maloney dilator/ otherwise unremarkable esophagus, small hiatal hernia, multiple fundal gland polyps not manipulated, gastritis, negative H.pylori  . ESOPHAGOGASTRODUODENOSCOPY  06/21/02   JAS:NKNLZ sliding hiatal hernia with mild changes of reflux esophagitis limited to gastroesophageal junction.  Noncritical ring at distal esophagus, 3 cm proximal to gastroesophageal junction/Antral gastritis  . [place]   "broke face playing softball"  . FRACTURE SURGERY     "left wrist; broke it; took spur off"  . HOLMIUM LASER APPLICATION Right 06/24/7340    Procedure: HOLMIUM LASER APPLICATION;  Surgeon: [name], MD;  [facility];  Service: Urology;  Laterality: Right;  . KNEE ARTHROSCOPY Right 05/18/2018   Procedure: PARTIAL MEDIAL MENISECTOMY AND SURGICAL LAVAGE AND CHONDROPLASTY;  Surgeon: [name], MD;  Location: [place];  Service: Orthopedics;  Laterality: Right;  . LEFT HEART CATHETERIZATION WITH CORONARY ANGIOGRAM N/A 12/27/2013   Procedure: LEFT HEART CATHETERIZATION WITH CORONARY ANGIOGRAM;  Surgeon: [name], MD;  [facility];  Service: Cardiovascular;  Laterality: N/A;  . neck epidural    . POSTERIOR LUMBAR FUSION 4 LEVEL N/A 03/28/2018   Procedure: Thoracic eight -Lumbar two- FIXATION WITH SCREW PLACEMENT, DECOMPRESSION Thoracic ten-Thoracic eleven  FOR OSTEOMYELITIS;  Surgeon: Kristeen Miss, MD;  Location: [place];  Service: Neurosurgery;  Laterality: N/A;  . SHOULDER SURGERY Bilateral   . SPINAL CORD STIMULATOR REMOVAL N/A 02/25/2020   Procedure: LUMBAR SPINAL CORD STIMULATOR REMOVAL;  Surgeon: Kristeen Miss, MD;  Location: [place];  Service: Neurosurgery;  Laterality: N/A;  . TEE WITHOUT CARDIOVERSION N/A 03/21/2018   Procedure: TRANSESOPHAGEAL ECHOCARDIOGRAM (TEE) WITH PROPOFOL;  Surgeon: [name], MD;  [facility];  Service: Cardiovascular;  Laterality: N/A;    There were no vitals filed for this visit.   Subjective Assessment - 06/12/20 0952    Subjective Patient says he has postponed his carpal tunnel surgery because his girlfriend fell and broke her shoulder. Reports no new complaints otherwise.    Currently in Pain? Yes    Pain Score 2     Pain Location Back    Pain Orientation Posterior;Lower    Pain Descriptors / Indicators Aching    Pain Type Chronic pain    Pain Onset More than a month ago    Pain Frequency Constant              [facility] PT Assessment - 06/12/20 0001      Assessment   Medical Diagnosis                           [facility] Adult PT  Treatment/Exercise - 06/12/20 0001      Exercises   Exercises Lumbar      Lumbar Exercises: Seated   Other Seated Lumbar Exercises seated unsupported core perturbations in all planes 5 min       Knee/Hip Exercises: Standing   Knee Flexion Both;2 sets;10 reps    Hip Abduction Both;10 reps;Knee straight;2 sets    Other Standing Knee Exercises sidestepping at mat 5 RT with UE support     Other Standing Knee Exercises semi tandem stance 3 x 30 sec rounds with CG and intermittent HHA       Knee/Hip Exercises: Seated   Sit to Sand 2 sets;5 reps;with UE support  PT Short Term Goals - 05/22/20 1607      PT SHORT TERM GOAL #1   Title Patient will be independent in self management strategies to improve quality of life and functional outcomes.    Time 3    Period Weeks    Status On-going    Target Date 05/01/20      PT SHORT TERM GOAL #2   Title Patient will report at least 25% improvement in overall symptoms and/or functional ability.    Time 3    Period Weeks    Status On-going    Target Date 05/01/20      PT SHORT TERM GOAL #3   Title Patient will report or demonstrate ability to perform ambulation for 2 minutes with LRAD with no more than supervision assistance without rest break and reported no more than minimal difficulty.     Time 3    Period Weeks    Status On-going    Target Date 05/01/20             PT Long Term Goals - 05/22/20 1607      PT LONG TERM GOAL #1   Title Patient will report at least 50% improvement in overall symptoms and/or functional ability.    Time 6    Period Weeks    Status On-going      PT LONG TERM GOAL #2   Title Patient will demonstrate ability to stand at a support surface for 10 minutes without a rest break in order to perform household chores and ADL's with increased ease.     Time 6    Period Weeks    Status On-going      PT LONG TERM GOAL #3   Title Patient will demonstrate ability to ambulate 120  feet without rest break using LRAD with no more than minimal assistance.    Time 6    Period Weeks    Status Achieved                 Plan - 06/12/20 1215    Clinical Impression Statement Patient limited by LE fatigue with standing activity today, requiring frequent rest breaks. Reviewed seated HEP with patient for progressing LE strengthening at home. Patient was well challenged with balance activity today requiring Min guard and intermittent HHA for stability. Patient continues to demo postural abnormality with knees flexed in standing position, and is unable to significantly correct with verbal cues. Added core perturbations in unsupported sitting position for core strengthening. Patient educated on purpose and function. Patient will continue to benefit from skilled therapy services to progress core and LE strength to improve functional mobility and performance with ADLs.    Personal Factors and Comorbidities Comorbidity 1;Comorbidity 2;Comorbidity 3+    Comorbidities multiple lumbar and cervical fusions, paraplegic    Examination-Activity Limitations Stand;Stairs;Locomotion Level    Examination-Participation Restrictions Meal Prep;Cleaning;Community Activity    Stability/Clinical Decision Making Stable/Uncomplicated    Rehab Potential Good    PT Frequency 2x / week    PT Duration 6 weeks    PT Treatment/Interventions ADLs/Self Care Home Management;Aquatic Therapy;Cryotherapy;Electrical Stimulation;Moist Heat;Balance training;Therapeutic exercise;Therapeutic activities;Functional mobility training;Stair training;Gait training;DME Instruction;Neuromuscular re-education;Patient/family education;Wheelchair mobility training;Manual techniques;Dry needling;Passive range of motion;Joint Manipulations    PT Next Visit Plan Patient postponed surgery, continue to progress LE and core strength as tolerated. Continue gait and balance as able    PT Home Exercise Plan 4/22 bridges    Consulted and  Agree with Plan of Care Patient  Patient will benefit from skilled therapeutic intervention in order to improve the following deficits and impairments:  Abnormal gait, Decreased endurance, Impaired sensation, Pain, Decreased strength, Decreased activity tolerance, Decreased balance, Decreased mobility, Difficulty walking, Decreased range of motion, Postural dysfunction, Improper body mechanics  Visit Diagnosis: Muscle weakness (generalized)  Difficulty in walking, not elsewhere classified  Chronic midline low back pain without sciatica  Other abnormalities of gait and mobility  Other symptoms and signs involving the musculoskeletal system     Problem List Patient Active Problem List   Diagnosis Date Noted  . Infection of spinal cord stimulator ([place]) 02/26/2020  . Pseudogout of knee   . Pyogenic arthritis of right knee joint ([place])   . Falls frequently 05/08/2018  . Fall   . Sleep disturbance   . Post-op pain   . Hypokalemia   . Morbid obesity ([place])   . Postoperative pain   . DVT, lower extremity, distal, acute, bilateral ([place])   . Acute blood loss anemia   . Anemia of chronic disease   . Essential hypertension   . Bacteremia   . Myelopathy ([place]) 03/30/2018  . Paraplegia ([place])   . Postlaminectomy syndrome, lumbar region   . Epidural abscess   . Abdominal distension   . Encephalopathy   . Weakness of both lower extremities   . Discitis of thoracic region   . Spinal cord stimulator status   . Staphylococcus aureus sepsis ([place]) 03/20/2018  . Bacteremia due to methicillin susceptible Staphylococcus aureus (MSSA) 03/18/2018  . Obesity 03/18/2018  . Nephrolithiasis 03/16/2018  . AKI (acute kidney injury) ([place]) 03/16/2018  . Cognitive impairment 03/16/2018  . Chronic pain syndrome 03/16/2018  . Cervicogenic headache 01/28/2016  . B12 deficiency 01/28/2016  . Memory difficulty 07/23/2015  . History of cerebrovascular disease 07/23/2015  . FH: colon cancer  08/13/2014  . Hx of adenomatous colonic polyps 08/13/2014  . Chronic back pain 05/30/2012  . CAD, NATIVE VESSEL 07/17/2010  . HYPERCHOLESTEROLEMIA 10/31/2009  . SMOKELESS TOBACCO ABUSE 10/31/2009  . Obstructive sleep apnea 10/31/2009  . Essential hypertension, benign 10/31/2009  . GERD 10/31/2009   12:18 PM, 06/12/20 Josue Hector PT DPT  Physical Therapist with [facility]  [phone] [place] [address] [place], [place], [address] Phone: [phone]   Fax:  [phone]  Name: Cody Velez MRN: [id number] Date of Birth: 04-09-1946

## 2020-06-16 ENCOUNTER — Telehealth (HOSPITAL_COMMUNITY): Payer: Self-pay | Admitting: Physical Therapy

## 2020-06-16 ENCOUNTER — Ambulatory Visit (HOSPITAL_COMMUNITY): Payer: No Typology Code available for payment source | Admitting: Physical Therapy

## 2020-06-16 NOTE — Telephone Encounter (Signed)
He is not feeling well today and wants to cx - he is hurting to much.

## 2020-06-19 ENCOUNTER — Ambulatory Visit (HOSPITAL_COMMUNITY): Payer: No Typology Code available for payment source | Attending: Family | Admitting: Physical Therapy

## 2020-06-19 ENCOUNTER — Encounter (HOSPITAL_COMMUNITY): Payer: Self-pay | Admitting: Physical Therapy

## 2020-06-19 ENCOUNTER — Other Ambulatory Visit: Payer: Self-pay

## 2020-06-19 DIAGNOSIS — M6281 Muscle weakness (generalized): Secondary | ICD-10-CM | POA: Diagnosis not present

## 2020-06-19 DIAGNOSIS — M545 Low back pain, unspecified: Secondary | ICD-10-CM

## 2020-06-19 DIAGNOSIS — G8929 Other chronic pain: Secondary | ICD-10-CM

## 2020-06-19 DIAGNOSIS — R2689 Other abnormalities of gait and mobility: Secondary | ICD-10-CM | POA: Diagnosis present

## 2020-06-19 DIAGNOSIS — R29898 Other symptoms and signs involving the musculoskeletal system: Secondary | ICD-10-CM

## 2020-06-19 DIAGNOSIS — R262 Difficulty in walking, not elsewhere classified: Secondary | ICD-10-CM | POA: Diagnosis present

## 2020-06-19 NOTE — Therapy (Signed)
[place], [place], [address] Phone: [phone]   Fax:  [phone]  Physical Therapy Treatment  Patient Details  Name: Cody Velez MRN: [id number] Date of Birth: 04-28-1946 Referring Provider (PT): Dorothea Ogle   Encounter Date: 06/19/2020   PT End of Session - 06/19/20 1623    Visit Number 12    Number of Visits 20    Date for PT Re-Evaluation 07/03/20   Eval 4/21, PN 6/3   Authorization Type Primary Medicare, Secondary BCBS, [facility]. 15 VL with VA champ and 75 VL for BCBS no auth needed for [employer], aquatics covered    Authorization - Visit Number 12    Authorization - Number of Visits 15    Progress Note Due on Visit 18    PT Start Time 1500    PT Stop Time 1550    PT Time Calculation (min) 50 min    Activity Tolerance Patient tolerated treatment well    Behavior During Therapy WFL for tasks assessed/performed           Past Medical History:  Diagnosis Date  . Allergic rhinitis   . Arthritis   . B12 deficiency   . Cervicogenic headache 01/28/2016   no current problems per patient 02/22/20  . Childhood asthma    as a child, no problems as an adult  . Chronic back pain   . Coronary atherosclerosis of native coronary artery    Mild nonobstructive CAD at catheterization January 2015  . Dementia ([place])   . Depression   . Essential hypertension   . Falls   . GERD (gastroesophageal reflux disease)   . Glaucoma   . History of blood transfusion    with a surgery procedure  . History of cerebrovascular disease 07/23/2015  . History of kidney stones    surgery to removed  . History of pneumonia 02/2011  . Hyperlipidemia   . Memory difficulty 07/23/2015  . OSA (obstructive sleep apnea)    CPAP - Dr. [name] - uses cpap every night  . Pneumonia   . Prostate cancer ([place])   . PTSD (post-traumatic stress disorder)    [place]  . Rectal bleeding   . Wheelchair bound     Past Surgical History:    Procedure Laterality Date  . APPLICATION OF ROBOTIC ASSISTANCE FOR SPINAL PROCEDURE N/A 03/28/2018   Procedure: APPLICATION OF ROBOTIC ASSISTANCE FOR SPINAL PROCEDURE;  Surgeon: Kristeen Miss, MD;  Location: [place];  Service: Neurosurgery;  Laterality: N/A;  . BACK SURGERY  02/14/12   lumbar OR #7; "today redid L1L2; replaced screws; added bone from hip"  . BILATERAL KNEE ARTHROSCOPY    . COLONOSCOPY  10/15/2008   Dr. [name]: tubular adenoma   . COLONOSCOPY  12/17/2003   JQB:HALPFX rectal and colon  . COLONOSCOPY N/A 09/05/2014   Procedure: COLONOSCOPY;  Surgeon: Daneil Dolin, MD;  [facility];  Service: Endoscopy;  Laterality: N/A;  7:30-rescheduled 9/17 to [place] notified pt  . CYSTOSCOPY WITH STENT PLACEMENT Right 01/27/2016   Procedure: CYSTOSCOPY WITH STENT PLACEMENT;  Surgeon: [name], MD;  [facility];  Service: Urology;  Laterality: Right;  . CYSTOSCOPY/RETROGRADE/URETEROSCOPY/STONE EXTRACTION WITH BASKET Right 01/27/2016   Procedure: CYSTOSCOPY, RIGHT RETROGRADE, RIGHT URETEROSCOPY, STONE EXTRACTION ;  Surgeon: [name], MD;  [facility];  Service: Urology;  Laterality: Right;  . ESOPHAGOGASTRODUODENOSCOPY  10/15/2008     Dr Rourk:Schatzki's ring status post dilation and  disruption via 22 F Maloney dilator/ otherwise unremarkable esophagus, small hiatal hernia, multiple fundal gland polyps not manipulated, gastritis, negative H.pylori  . ESOPHAGOGASTRODUODENOSCOPY  06/21/02   EHO:ZYYQM sliding hiatal hernia with mild changes of reflux esophagitis limited to gastroesophageal junction.  Noncritical ring at distal esophagus, 3 cm proximal to gastroesophageal junction/Antral gastritis  . [place]   "broke face playing softball"  . FRACTURE SURGERY     "left wrist; broke it; took spur off"  . HOLMIUM LASER APPLICATION Right 01/25/36   Procedure: HOLMIUM LASER APPLICATION;  Surgeon: [name], MD;  [facility];   Service: Urology;  Laterality: Right;  . KNEE ARTHROSCOPY Right 05/18/2018   Procedure: PARTIAL MEDIAL MENISECTOMY AND SURGICAL LAVAGE AND CHONDROPLASTY;  Surgeon: [name], MD;  Location: [place];  Service: Orthopedics;  Laterality: Right;  . LEFT HEART CATHETERIZATION WITH CORONARY ANGIOGRAM N/A 12/27/2013   Procedure: LEFT HEART CATHETERIZATION WITH CORONARY ANGIOGRAM;  Surgeon: [name], MD;  Location: [facility] C[facility] LAB;  Service: Cardiovascular;  Laterality: N/A;  . neck epidural    . POSTERIOR LUMBAR FUSION 4 LEVEL N/A 03/28/2018   Procedure: Thoracic eight -Lumbar two- FIXATION WITH SCREW PLACEMENT, DECOMPRESSION Thoracic ten-Thoracic eleven  FOR OSTEOMYELITIS;  Surgeon: Kristeen Miss, MD;  Location: [place];  Service: Neurosurgery;  Laterality: N/A;  . SHOULDER SURGERY Bilateral   . SPINAL CORD STIMULATOR REMOVAL N/A 02/25/2020   Procedure: LUMBAR SPINAL CORD STIMULATOR REMOVAL;  Surgeon: Kristeen Miss, MD;  Location: [place];  Service: Neurosurgery;  Laterality: N/A;  . TEE WITHOUT CARDIOVERSION N/A 03/21/2018   Procedure: TRANSESOPHAGEAL ECHOCARDIOGRAM (TEE) WITH PROPOFOL;  Surgeon: [name], MD;  [facility];  Service: Cardiovascular;  Laterality: N/A;    There were no vitals filed for this visit.   Subjective Assessment - 06/19/20 1623    Subjective Patient says he was doing alright yesterday but his back started hurting this morning, not sure why.    Currently in Pain? Yes    Pain Score 3     Pain Location Back    Pain Orientation Lower;Posterior    Pain Descriptors / Indicators Aching    Pain Onset More than a month ago    Pain Frequency Constant                         Adult Aquatic Therapy - 06/19/20 1635      Treatment   Gait pool walking, sidestepping, retro walk, asll using pool noodle for balance 30' 4RT each     Exercises TA brace at wall 10 x 5", standing march x20 with 3# ankle weights, wall sits 3 x 30", heel raises at wall x20,  standing hip abduction x20 with 3#, standing knee flexion 3# x 20 each, lumbar traction using float 5 x 60", single DB press down x10 each, double DB press down x 20                        PT Short Term Goals - 05/22/20 1607      PT SHORT TERM GOAL #1   Title Patient will be independent in self management strategies to improve quality of life and functional outcomes.    Time 3    Period Weeks    Status On-going    Target Date 05/01/20      PT SHORT TERM GOAL #2   Title Patient will report at least  25% improvement in overall symptoms and/or functional ability.    Time 3    Period Weeks    Status On-going    Target Date 05/01/20      PT SHORT TERM GOAL #3   Title Patient will report or demonstrate ability to perform ambulation for 2 minutes with LRAD with no more than supervision assistance without rest break and reported no more than minimal difficulty.     Time 3    Period Weeks    Status On-going    Target Date 05/01/20             PT Long Term Goals - 05/22/20 1607      PT LONG TERM GOAL #1   Title Patient will report at least 50% improvement in overall symptoms and/or functional ability.    Time 6    Period Weeks    Status On-going      PT LONG TERM GOAL #2   Title Patient will demonstrate ability to stand at a support surface for 10 minutes without a rest break in order to perform household chores and ADL's with increased ease.     Time 6    Period Weeks    Status On-going      PT LONG TERM GOAL #3   Title Patient will demonstrate ability to ambulate 120 feet without rest break using LRAD with no more than minimal assistance.    Time 6    Period Weeks    Status Achieved                 Plan - 06/19/20 1624    Clinical Impression Statement Patient tolerated session well overall today. Patient able to progress overall activity level, and progress to resistance with LE strengthening exercise. Patient does note intermittent lumbar pain with  prolonged standing, though significantly less than in land-based therapy. Patient encouraged to pace activity level to tolerance, and notes relief with lumbar traction using pool floats between sets. Added dumbbell press down today for core strengthening. Patient cued on form and posturing during standing hip abductions. Patient will continue to benefit from skilled therapy services to progress core strength and balance to reduce pain and improve functional mobility.    Personal Factors and Comorbidities Comorbidity 1;Comorbidity 2;Comorbidity 3+    Comorbidities multiple lumbar and cervical fusions, paraplegic    Examination-Activity Limitations Stand;Stairs;Locomotion Level    Examination-Participation Restrictions Meal Prep;Cleaning;Community Activity    Stability/Clinical Decision Making Stable/Uncomplicated    Rehab Potential Good    PT Frequency 2x / week    PT Duration 6 weeks    PT Treatment/Interventions ADLs/Self Care Home Management;Aquatic Therapy;Cryotherapy;Electrical Stimulation;Moist Heat;Balance training;Therapeutic exercise;Therapeutic activities;Functional mobility training;Stair training;Gait training;DME Instruction;Neuromuscular re-education;Patient/family education;Wheelchair mobility training;Manual techniques;Dry needling;Passive range of motion;Joint Manipulations    PT Next Visit Plan Continue to progress LE and core strength as tolerated. Continue gait and balance as able    PT Home Exercise Plan 4/22 bridges    Consulted and Agree with Plan of Care Patient           Patient will benefit from skilled therapeutic intervention in order to improve the following deficits and impairments:  Abnormal gait, Decreased endurance, Impaired sensation, Pain, Decreased strength, Decreased activity tolerance, Decreased balance, Decreased mobility, Difficulty walking, Decreased range of motion, Postural dysfunction, Improper body mechanics  Visit Diagnosis: Muscle weakness  (generalized)  Difficulty in walking, not elsewhere classified  Chronic midline low back pain without sciatica  Other abnormalities of gait and mobility  Other symptoms and signs involving the musculoskeletal system     Problem List Patient Active Problem List   Diagnosis Date Noted  . Infection of spinal cord stimulator ([place]) 02/26/2020  . Pseudogout of knee   . Pyogenic arthritis of right knee joint ([place])   . Falls frequently 05/08/2018  . Fall   . Sleep disturbance   . Post-op pain   . Hypokalemia   . Morbid obesity ([place])   . Postoperative pain   . DVT, lower extremity, distal, acute, bilateral ([place])   . Acute blood loss anemia   . Anemia of chronic disease   . Essential hypertension   . Bacteremia   . Myelopathy ([place]) 03/30/2018  . Paraplegia ([place])   . Postlaminectomy syndrome, lumbar region   . Epidural abscess   . Abdominal distension   . Encephalopathy   . Weakness of both lower extremities   . Discitis of thoracic region   . Spinal cord stimulator status   . Staphylococcus aureus sepsis ([place]) 03/20/2018  . Bacteremia due to methicillin susceptible Staphylococcus aureus (MSSA) 03/18/2018  . Obesity 03/18/2018  . Nephrolithiasis 03/16/2018  . AKI (acute kidney injury) ([place]) 03/16/2018  . Cognitive impairment 03/16/2018  . Chronic pain syndrome 03/16/2018  . Cervicogenic headache 01/28/2016  . B12 deficiency 01/28/2016  . Memory difficulty 07/23/2015  . History of cerebrovascular disease 07/23/2015  . FH: colon cancer 08/13/2014  . Hx of adenomatous colonic polyps 08/13/2014  . Chronic back pain 05/30/2012  . CAD, NATIVE VESSEL 07/17/2010  . HYPERCHOLESTEROLEMIA 10/31/2009  . SMOKELESS TOBACCO ABUSE 10/31/2009  . Obstructive sleep apnea 10/31/2009  . Essential hypertension, benign 10/31/2009  . GERD 10/31/2009    5:10 PM, 06/19/20 Josue Hector PT DPT  Physical Therapist with [facility]  [phone] [place] [address] [place], [place], [address] Phone: [phone]   Fax:  [phone]  Name: Cody Velez MRN: [id number] Date of Birth: 1946-08-28

## 2020-06-26 ENCOUNTER — Other Ambulatory Visit: Payer: Self-pay

## 2020-06-26 ENCOUNTER — Encounter (HOSPITAL_COMMUNITY): Payer: Self-pay | Admitting: Physical Therapy

## 2020-06-26 ENCOUNTER — Ambulatory Visit (HOSPITAL_COMMUNITY): Payer: No Typology Code available for payment source | Admitting: Physical Therapy

## 2020-06-26 DIAGNOSIS — R29898 Other symptoms and signs involving the musculoskeletal system: Secondary | ICD-10-CM

## 2020-06-26 DIAGNOSIS — M6281 Muscle weakness (generalized): Secondary | ICD-10-CM | POA: Diagnosis not present

## 2020-06-26 DIAGNOSIS — R2689 Other abnormalities of gait and mobility: Secondary | ICD-10-CM

## 2020-06-26 DIAGNOSIS — R262 Difficulty in walking, not elsewhere classified: Secondary | ICD-10-CM

## 2020-06-26 DIAGNOSIS — G8929 Other chronic pain: Secondary | ICD-10-CM

## 2020-06-26 NOTE — Therapy (Signed)
[facility] [place], [place], [address] Phone: [phone]   Fax:  [phone]  Physical Therapy Treatment  Patient Details  Name: Cody Velez MRN: [id number] Date of Birth: [birth year] Referring Provider (PT): Dorothea Ogle   Encounter Date: 06/26/2020   PT End of Session - 06/26/20 1655    Visit Number 13    Number of Visits 20    Date for PT Re-Evaluation 07/03/20   Eval 4/21, PN 6/3   Authorization Type Primary Medicare, Secondary BCBS, [facility]. 15 VL with VA champ and 75 VL for BCBS no auth needed for [employer], aquatics covered    Authorization - Visit Number 13    Authorization - Number of Visits 15    Progress Note Due on Visit 18    PT Start Time 1205    PT Stop Time 1255    PT Time Calculation (min) 50 min    Activity Tolerance Patient tolerated treatment well    Behavior During Therapy WFL for tasks assessed/performed           Past Medical History:  Diagnosis Date  . Allergic rhinitis   . Arthritis   . B12 deficiency   . Cervicogenic headache 01/28/2016   no current problems per patient 02/22/20  . Childhood asthma    as a child, no problems as an adult  . Chronic back pain   . Coronary atherosclerosis of native coronary artery    Mild nonobstructive CAD at catheterization January 2015  . Dementia ([place])   . Depression   . Essential hypertension   . Falls   . GERD (gastroesophageal reflux disease)   . Glaucoma   . History of blood transfusion    with a surgery procedure  . History of cerebrovascular disease 07/23/2015  . History of kidney stones    surgery to removed  . History of pneumonia 02/2011  . Hyperlipidemia   . Memory difficulty 07/23/2015  . OSA (obstructive sleep apnea)    CPAP - Dr. [name] - uses cpap every night  . Pneumonia   . Prostate cancer ([place])   . PTSD (post-traumatic stress disorder)    [place]  . Rectal bleeding   . Wheelchair bound     Past Surgical History:    Procedure Laterality Date  . APPLICATION OF ROBOTIC ASSISTANCE FOR SPINAL PROCEDURE N/A 03/28/2018   Procedure: APPLICATION OF ROBOTIC ASSISTANCE FOR SPINAL PROCEDURE;  Surgeon: Kristeen Miss, MD;  Location: [facility];  Service: Neurosurgery;  Laterality: N/A;  . BACK SURGERY  02/14/12   lumbar OR #7; "today redid L1L2; replaced screws; added bone from hip"  . BILATERAL KNEE ARTHROSCOPY    . COLONOSCOPY  10/15/2008   Dr. [name]: tubular adenoma   . COLONOSCOPY  12/17/2003   WYO:VZCHYI rectal and colon  . COLONOSCOPY N/A 09/05/2014   Procedure: COLONOSCOPY;  Surgeon: Daneil Dolin, MD;  [facility];  Service: Endoscopy;  Laterality: N/A;  7:30-rescheduled 9/17 to [place] notified pt  . CYSTOSCOPY WITH STENT PLACEMENT Right 01/27/2016   Procedure: CYSTOSCOPY WITH STENT PLACEMENT;  Surgeon: [name], MD;  Location: [facility];  Service: Urology;  Laterality: Right;  . CYSTOSCOPY/RETROGRADE/URETEROSCOPY/STONE EXTRACTION WITH BASKET Right 01/27/2016   Procedure: CYSTOSCOPY, RIGHT RETROGRADE, RIGHT URETEROSCOPY, STONE EXTRACTION ;  Surgeon: [name], MD;  Location: [facility];  Service: Urology;  Laterality: Right;  . ESOPHAGOGASTRODUODENOSCOPY  10/15/2008     Dr Rourk:Schatzki's ring status post dilation and  disruption via 14 F Maloney dilator/ otherwise unremarkable esophagus, small hiatal hernia, multiple fundal gland polyps not manipulated, gastritis, negative H.pylori  . ESOPHAGOGASTRODUODENOSCOPY  06/21/02   XNT:ZGYFV sliding hiatal hernia with mild changes of reflux esophagitis limited to gastroesophageal junction.  Noncritical ring at distal esophagus, 3 cm proximal to gastroesophageal junction/Antral gastritis  . [place]   "broke face playing softball"  . FRACTURE SURGERY     "left wrist; broke it; took spur off"  . HOLMIUM LASER APPLICATION Right 03/29/4495   Procedure: HOLMIUM LASER APPLICATION;  Surgeon: [name], MD;  Location: [facility];   Service: Urology;  Laterality: Right;  . KNEE ARTHROSCOPY Right 05/18/2018   Procedure: PARTIAL MEDIAL MENISECTOMY AND SURGICAL LAVAGE AND CHONDROPLASTY;  Surgeon: [name], MD;  Location: [facility];  Service: Orthopedics;  Laterality: Right;  . LEFT HEART CATHETERIZATION WITH CORONARY ANGIOGRAM N/A 12/27/2013   Procedure: LEFT HEART CATHETERIZATION WITH CORONARY ANGIOGRAM;  Surgeon: [name], MD;  Location: [facility] C[facility];  Service: Cardiovascular;  Laterality: N/A;  . neck epidural    . POSTERIOR LUMBAR FUSION 4 LEVEL N/A 03/28/2018   Procedure: Thoracic eight -Lumbar two- FIXATION WITH SCREW PLACEMENT, DECOMPRESSION Thoracic ten-Thoracic eleven  FOR OSTEOMYELITIS;  Surgeon: Kristeen Miss, MD;  Location: [facility];  Service: Neurosurgery;  Laterality: N/A;  . SHOULDER SURGERY Bilateral   . SPINAL CORD STIMULATOR REMOVAL N/A 02/25/2020   Procedure: LUMBAR SPINAL CORD STIMULATOR REMOVAL;  Surgeon: Kristeen Miss, MD;  Location: [facility];  Service: Neurosurgery;  Laterality: N/A;  . TEE WITHOUT CARDIOVERSION N/A 03/21/2018   Procedure: TRANSESOPHAGEAL ECHOCARDIOGRAM (TEE) WITH PROPOFOL;  Surgeon: [name], MD;  [facility];  Service: Cardiovascular;  Laterality: N/A;    There were no vitals filed for this visit.   Subjective Assessment - 06/26/20 1654    Subjective Pateitn reports no new issues since last visit. Says he was not too sore after progressions last time, and liked the workout    Currently in Pain? Yes    Pain Score 3     Pain Location Back    Pain Orientation Lower;Posterior    Pain Descriptors / Indicators Aching    Pain Type Chronic pain    Pain Onset More than a month ago                         Adult Aquatic Therapy - 06/26/20 1703      Treatment   Gait pool walking, sidestepping, retro walk, all using pool noodle for balance 30' 4RT each     Exercises TA brace at wall 10 x 5", mini squats using pool DBs x20, standing march x20 with 3# ankle  weights, heel raises at wall x20, standing hip abduction x20 with 3#, standing knee flexion 3# x 20 each, lumbar traction using float 5 x 60", single DB press down x10 each, double DB press down x 20                        PT Short Term Goals - 05/22/20 1607      PT SHORT TERM GOAL #1   Title Patient will be independent in self management strategies to improve quality of life and functional outcomes.    Time 3    Period Weeks    Status On-going    Target Date 05/01/20      PT SHORT TERM GOAL #2  Title Patient will report at least 25% improvement in overall symptoms and/or functional ability.    Time 3    Period Weeks    Status On-going    Target Date 05/01/20      PT SHORT TERM GOAL #3   Title Patient will report or demonstrate ability to perform ambulation for 2 minutes with LRAD with no more than supervision assistance without rest break and reported no more than minimal difficulty.     Time 3    Period Weeks    Status On-going    Target Date 05/01/20             PT Long Term Goals - 05/22/20 1607      PT LONG TERM GOAL #1   Title Patient will report at least 50% improvement in overall symptoms and/or functional ability.    Time 6    Period Weeks    Status On-going      PT LONG TERM GOAL #2   Title Patient will demonstrate ability to stand at a support surface for 10 minutes without a rest break in order to perform household chores and ADL's with increased ease.     Time 6    Period Weeks    Status On-going      PT LONG TERM GOAL #3   Title Patient will demonstrate ability to ambulate 120 feet without rest break using LRAD with no more than minimal assistance.    Time 6    Period Weeks    Status Achieved                 Plan - 06/26/20 1656    Clinical Impression Statement Patient shows improved return of core strengthening exercise in pool, as well as some improvement in dynamic balance with pool walking today. Patient required verbal  cues for proper form and mechanics with mini squats using pool dumbbells. Patient with improved tolerance to therapeutic activity in gravity minimized setting, and able to complete exercises today with no increased complaint of pain. Patient will continue to benefit from skilled therapy services to progress LE and core strength to improve balance and functional mobility.    Personal Factors and Comorbidities Comorbidity 1;Comorbidity 2;Comorbidity 3+    Comorbidities multiple lumbar and cervical fusions, paraplegic    Examination-Activity Limitations Stand;Stairs;Locomotion Level    Examination-Participation Restrictions Meal Prep;Cleaning;Community Activity    Stability/Clinical Decision Making Stable/Uncomplicated    Rehab Potential Good    PT Frequency 2x / week    PT Duration 6 weeks    PT Treatment/Interventions ADLs/Self Care Home Management;Aquatic Therapy;Cryotherapy;Electrical Stimulation;Moist Heat;Balance training;Therapeutic exercise;Therapeutic activities;Functional mobility training;Stair training;Gait training;DME Instruction;Neuromuscular re-education;Patient/family education;Wheelchair mobility training;Manual techniques;Dry needling;Passive range of motion;Joint Manipulations    PT Next Visit Plan Continue to progress LE and core strength as tolerated. Continue gait and balance as able    PT Home Exercise Plan 4/22 bridges    Consulted and Agree with Plan of Care Patient           Patient will benefit from skilled therapeutic intervention in order to improve the following deficits and impairments:  Abnormal gait, Decreased endurance, Impaired sensation, Pain, Decreased strength, Decreased activity tolerance, Decreased balance, Decreased mobility, Difficulty walking, Decreased range of motion, Postural dysfunction, Improper body mechanics  Visit Diagnosis: Muscle weakness (generalized)  Difficulty in walking, not elsewhere classified  Chronic midline low back pain without  sciatica  Other abnormalities of gait and mobility  Other symptoms and signs involving the musculoskeletal system  Problem List Patient Active Problem List   Diagnosis Date Noted  . Infection of spinal cord stimulator ([place]) 02/26/2020  . Pseudogout of knee   . Pyogenic arthritis of right knee joint ([place])   . Falls frequently 05/08/2018  . Fall   . Sleep disturbance   . Post-op pain   . Hypokalemia   . Morbid obesity ([place])   . Postoperative pain   . DVT, lower extremity, distal, acute, bilateral ([place])   . Acute blood loss anemia   . Anemia of chronic disease   . Essential hypertension   . Bacteremia   . Myelopathy ([place]) 03/30/2018  . Paraplegia ([place])   . Postlaminectomy syndrome, lumbar region   . Epidural abscess   . Abdominal distension   . Encephalopathy   . Weakness of both lower extremities   . Discitis of thoracic region   . Spinal cord stimulator status   . Staphylococcus aureus sepsis ([place]) 03/20/2018  . Bacteremia due to methicillin susceptible Staphylococcus aureus (MSSA) 03/18/2018  . Obesity 03/18/2018  . Nephrolithiasis 03/16/2018  . AKI (acute kidney injury) ([place]) 03/16/2018  . Cognitive impairment 03/16/2018  . Chronic pain syndrome 03/16/2018  . Cervicogenic headache 01/28/2016  . B12 deficiency 01/28/2016  . Memory difficulty 07/23/2015  . History of cerebrovascular disease 07/23/2015  . FH: colon cancer 08/13/2014  . Hx of adenomatous colonic polyps 08/13/2014  . Chronic back pain 05/30/2012  . CAD, NATIVE VESSEL 07/17/2010  . HYPERCHOLESTEROLEMIA 10/31/2009  . SMOKELESS TOBACCO ABUSE 10/31/2009  . Obstructive sleep apnea 10/31/2009  . Essential hypertension, benign 10/31/2009  . GERD 10/31/2009    5:06 PM, 06/26/20 Josue Hector PT DPT  Physical Therapist with [facility]  [phone] [place] [address] [place], [place], [address] Phone: [phone]    Fax:  [phone]  Name: DIAGO HAIK MRN: [id number] Date of Birth: 1946/04/14

## 2020-07-01 ENCOUNTER — Other Ambulatory Visit: Payer: Self-pay

## 2020-07-01 ENCOUNTER — Ambulatory Visit (HOSPITAL_COMMUNITY): Payer: No Typology Code available for payment source

## 2020-07-01 ENCOUNTER — Encounter (HOSPITAL_COMMUNITY): Payer: Self-pay

## 2020-07-01 DIAGNOSIS — G8929 Other chronic pain: Secondary | ICD-10-CM

## 2020-07-01 DIAGNOSIS — M6281 Muscle weakness (generalized): Secondary | ICD-10-CM | POA: Diagnosis not present

## 2020-07-01 DIAGNOSIS — R262 Difficulty in walking, not elsewhere classified: Secondary | ICD-10-CM

## 2020-07-01 NOTE — Therapy (Signed)
Cody Velez, [place], [address] Phone: [phone]   Fax:  [phone]   Progress Note Reporting Period 05/22/20 to 07/01/20  See note below for Objective Data and Assessment of Progress/Goals.       Physical Therapy Treatment  Patient Details  Name: Cody Velez MRN: [id number] Date of Birth: 16-Oct-1946 Referring Provider (PT): Cody Velez   Encounter Date: 07/01/2020   PT End of Session - 07/01/20 0857    Visit Number 14    Number of Visits 24    Date for PT Re-Evaluation 07/18/20   Eval 4/21, PN 6/3, PN 7/13   Authorization Type Primary Medicare, Secondary BCBS, [facility]. 15 VL with VA champ and 75 VL for BCBS no auth needed for [employer], aquatics covered    Authorization - Visit Number 14    Authorization - Number of Visits 15    Progress Note Due on Visit 18    PT Start Time 0900    PT Stop Time 0943    PT Time Calculation (min) 43 min    Equipment Utilized During Treatment Gait belt    Activity Tolerance Patient tolerated treatment well    Behavior During Therapy WFL for tasks assessed/performed           Past Medical History:  Diagnosis Date  . Allergic rhinitis   . Arthritis   . B12 deficiency   . Cervicogenic headache 01/28/2016   no current problems per patient 02/22/20  . Childhood asthma    as a child, no problems as an adult  . Chronic back pain   . Coronary atherosclerosis of native coronary artery    Mild nonobstructive CAD at catheterization January 2015  . Dementia ([place])   . Depression   . Essential hypertension   . Falls   . GERD (gastroesophageal reflux disease)   . Glaucoma   . History of blood transfusion    with a surgery procedure  . History of cerebrovascular disease 07/23/2015  . History of kidney stones    surgery to removed  . History of pneumonia 02/2011  . Hyperlipidemia   . Memory difficulty 07/23/2015  . OSA (obstructive sleep apnea)    CPAP - Dr. Gwenette Velez -  uses cpap every night  . Pneumonia   . Prostate cancer ([place])   . PTSD (post-traumatic stress disorder)    [place]  . Rectal bleeding   . Wheelchair bound     Past Surgical History:  Procedure Laterality Date  . APPLICATION OF ROBOTIC ASSISTANCE FOR SPINAL PROCEDURE N/A 03/28/2018   Procedure: APPLICATION OF ROBOTIC ASSISTANCE FOR SPINAL PROCEDURE;  Surgeon: Cody Miss, MD;  Location: [facility];  Service: Neurosurgery;  Laterality: N/A;  . BACK SURGERY  02/14/12   lumbar OR #7; "today redid L1L2; replaced screws; added bone from hip"  . BILATERAL KNEE ARTHROSCOPY    . COLONOSCOPY  10/15/2008   Dr. Gala Velez: tubular adenoma   . COLONOSCOPY  12/17/2003   OIZ:TIWPYK rectal and colon  . COLONOSCOPY N/A 09/05/2014   Procedure: COLONOSCOPY;  Surgeon: Cody Dolin, MD;  [facility];  Service: Endoscopy;  Laterality: N/A;  7:30-rescheduled 9/17 to [place] notified pt  . CYSTOSCOPY WITH STENT PLACEMENT Right 01/27/2016   Procedure: CYSTOSCOPY WITH STENT PLACEMENT;  Surgeon: Cody Gallo, MD;  Location: [facility];  Service: Urology;  Laterality: Right;  . CYSTOSCOPY/RETROGRADE/URETEROSCOPY/STONE EXTRACTION WITH BASKET Right 01/27/2016   Procedure: CYSTOSCOPY, RIGHT RETROGRADE, RIGHT  URETEROSCOPY, STONE EXTRACTION ;  Surgeon: Cody Gallo, MD;  Location: [facility];  Service: Urology;  Laterality: Right;  . ESOPHAGOGASTRODUODENOSCOPY  10/15/2008     Dr Rourk:Schatzki's ring status post dilation and disruption via 43 F Maloney dilator/ otherwise unremarkable esophagus, small hiatal hernia, multiple fundal gland polyps not manipulated, gastritis, negative H.pylori  . ESOPHAGOGASTRODUODENOSCOPY  06/21/02   QJJ:HERDE sliding hiatal hernia with mild changes of reflux esophagitis limited to gastroesophageal junction.  Noncritical ring at distal esophagus, 3 cm proximal to gastroesophageal junction/Antral gastritis  . [place]   "broke face playing softball"  .  FRACTURE SURGERY     "left wrist; broke it; took spur off"  . HOLMIUM LASER APPLICATION Right 0/07/1447   Procedure: HOLMIUM LASER APPLICATION;  Surgeon: Cody Gallo, MD;  Location: [facility];  Service: Urology;  Laterality: Right;  . KNEE ARTHROSCOPY Right 05/18/2018   Procedure: PARTIAL MEDIAL MENISECTOMY AND SURGICAL LAVAGE AND CHONDROPLASTY;  Surgeon: Cody Bond, MD;  Location: [facility];  Service: Orthopedics;  Laterality: Right;  . LEFT HEART CATHETERIZATION WITH CORONARY ANGIOGRAM N/A 12/27/2013   Procedure: LEFT HEART CATHETERIZATION WITH CORONARY ANGIOGRAM;  Surgeon: Cody M Martinique, MD;  Location: [facility] C[facility];  Service: Cardiovascular;  Laterality: N/A;  . neck epidural    . POSTERIOR LUMBAR FUSION 4 LEVEL N/A 03/28/2018   Procedure: Thoracic eight -Lumbar two- FIXATION WITH SCREW PLACEMENT, DECOMPRESSION Thoracic ten-Thoracic eleven  FOR OSTEOMYELITIS;  Surgeon: Cody Miss, MD;  Location: [facility];  Service: Neurosurgery;  Laterality: N/A;  . SHOULDER SURGERY Bilateral   . SPINAL CORD STIMULATOR REMOVAL N/A 02/25/2020   Procedure: LUMBAR SPINAL CORD STIMULATOR REMOVAL;  Surgeon: Cody Miss, MD;  Location: [facility];  Service: Neurosurgery;  Laterality: N/A;  . TEE WITHOUT CARDIOVERSION N/A 03/21/2018   Procedure: TRANSESOPHAGEAL ECHOCARDIOGRAM (TEE) WITH PROPOFOL;  Surgeon: Cody Sark, MD;  [facility];  Service: Cardiovascular;  Laterality: N/A;    There were no vitals filed for this visit.   Subjective Assessment - 07/01/20 0902    Subjective Pt reports he doesn't think he has improved a lot but he feels better, except when he hurts. Pt reports current 4/10 LBP at this session, no injury, "just one of them days".    Currently in Pain? Yes    Pain Score 4     Pain Location Back    Pain Orientation Lower    Pain Descriptors / Indicators Aching    Pain Type Chronic pain    Pain Onset More than a month ago    Pain Frequency Constant    Aggravating Factors  movement      Pain Relieving Factors rest              [facility] PT Assessment - 07/01/20 0001      Assessment   Medical Diagnosis LBP    Referring Provider (PT) Cody Velez    Next MD Visit none scheduled    Prior Therapy yes for gait training      Transfers   Sit to Stand 6: Modified independent (Device/Increase time);With upper extremity assist;From bed   was MOD I, continues to require UE assist   Stand to Sit 6: Modified independent (Device/Increase time)   was MOD I   Supine to Sit 6: Modified independent (Device/Increase time)   was MOD I   Sit to Supine 6: Modified independent (Device/Increase time)   was MOD I   Comments 13mn 27 sec, heavy BUE support  was stand ~60 sec, lacking full knee/hip ext     Berg Balance Test   Sit to Stand Able to stand  independently using hands    Standing Unsupported Able to stand 2 minutes with supervision    Sitting with Back Unsupported but Feet Supported on Floor or Stool Able to sit safely and securely 2 minutes    Stand to Sit Controls descent by using hands    Transfers Able to transfer safely, definite need of hands    Standing Unsupported with Eyes Closed Able to stand 3 seconds    Standing Unsupported with Feet Together Needs help to attain position but able to stand for 30 seconds with feet together    From Standing, Reach Forward with Outstretched Arm Reaches forward but needs supervision    From Standing Position, Pick up Object from Floor Unable to try/needs assist to keep balance    From Standing Position, Turn to Look Behind Over each Shoulder Turn sideways only but maintains balance    Turn 360 Degrees Needs close supervision or verbal cueing    Standing Unsupported, Alternately Place Feet on Step/Stool Needs assistance to keep from falling or unable to try    Standing Unsupported, One Foot in Front Loses balance while stepping or standing    Standing on One Leg Unable to try or needs assist to prevent fall    Total Score 23    Berg  comment: was 48                 PT Education - 07/01/20 1228    Education Details Educated pt on realistic goals, land therapy, motivation level at home, and developing HEP on land and aquatics to progress to independently performing at home. Discussed therapeutic process to establish pt independence with exercises to carry forward at home once discharged.    Person(s) Educated Patient    Methods Explanation    Comprehension Verbalized understanding            PT Short Term Goals - 07/01/20 0904      PT SHORT TERM GOAL #1   Title Patient will be independent in self management strategies to improve quality of life and functional outcomes.    Baseline 7/13: pt reports ind    Time 3    Period Weeks    Status Achieved    Target Date 05/01/20      PT SHORT TERM GOAL #2   Title Patient will report at least 25% improvement in overall symptoms and/or functional ability.    Baseline 7/13: pt reports 0%    Time 3    Period Weeks    Status Not Met    Target Date 05/01/20      PT SHORT TERM GOAL #3   Title Patient will report or demonstrate ability to perform ambulation for 2 minutes with LRAD with no more than supervision assistance without rest break and reported no more than minimal difficulty.     Baseline 7/13: denies amb at home    Time 3    Period Weeks    Status Not Met    Target Date 05/01/20             PT Long Term Goals - 07/01/20 0906      PT LONG TERM GOAL #1   Title Patient will report at least 50% improvement in overall symptoms and/or functional ability.    Baseline 7/13: pt reports 0%    Time 6  Period Weeks    Status Not Met      PT LONG TERM GOAL #2   Title Patient will demonstrate ability to stand at a support surface for 10 minutes without a rest break in order to perform household chores and ADL's with increased ease.     Baseline 7/13: 7 min 27 sec    Time 6    Period Weeks    Status On-going      PT LONG TERM GOAL #3   Title Patient  will demonstrate ability to ambulate 120 feet without rest break using LRAD with no more than minimal assistance.    Baseline 7/13: denies amb at home, not attempted this session    Time 6    Period Weeks    Status Achieved                 Plan - 07/01/20 1237    Clinical Impression Statement Pt due for reassessment this session due to end of cert. Therapist discussed therapeutic process, progress pt to be independent with exercises, realistic goals and POC moving forward; pt with realization that he wants to ambulate and stand, but admits he isn't working on it at home.  Pt with significant static standing time improvement, able to stand with significant BUE assist for 7 min 27 sec, limited by R quad weakness/spasm, unable to fully extend hips and knees in stance, but able to maintain static standing with constant encouragement. Pt performs functional transfers at MOD I requiring BUE assist and increased time, but performs safely and without physical assist or verbal cues. Pt with significantly decreased Berg score this session, but possibly due to performing at EOS and fatigued from standing and transfers. Pt would benefit from 2 week POC extension to progress strengthening, balance, endurance and finalize land and aquatic HEP to allow pt to progress as tolerable independently once discharged.    Personal Factors and Comorbidities Comorbidity 1;Comorbidity 2;Comorbidity 3+    Comorbidities multiple lumbar and cervical fusions, paraplegic    Examination-Activity Limitations Stand;Stairs;Locomotion Level    Examination-Participation Restrictions Meal Prep;Cleaning;Community Activity    Stability/Clinical Decision Making Stable/Uncomplicated    Rehab Potential Good    PT Frequency 2x / week    PT Duration 2 weeks    PT Treatment/Interventions ADLs/Self Care Home Management;Aquatic Therapy;Cryotherapy;Electrical Stimulation;Moist Heat;Balance training;Therapeutic exercise;Therapeutic  activities;Functional mobility training;Stair training;Gait training;DME Instruction;Neuromuscular re-education;Patient/family education;Wheelchair mobility training;Manual techniques;Dry needling;Passive range of motion;Joint Manipulations    PT Next Visit Plan Progress BLE strength, core strength, balance and gait within achievable goal range. Finalize HEP to allow pt to progress independently once discharge.    PT Home Exercise Plan 4/22 bridges    Consulted and Agree with Plan of Care Patient           Patient will benefit from skilled therapeutic intervention in order to improve the following deficits and impairments:  Abnormal gait, Decreased endurance, Impaired sensation, Pain, Decreased strength, Decreased activity tolerance, Decreased balance, Decreased mobility, Difficulty walking, Decreased range of motion, Postural dysfunction, Improper body mechanics  Visit Diagnosis: Muscle weakness (generalized)  Difficulty in walking, not elsewhere classified  Chronic midline low back pain without sciatica     Problem List Patient Active Problem List   Diagnosis Date Noted  . Infection of spinal cord stimulator ([place]) 02/26/2020  . Pseudogout of knee   . Pyogenic arthritis of right knee joint ([place])   . Falls frequently 05/08/2018  . Fall   . Sleep disturbance   . Post-op  pain   . Hypokalemia   . Morbid obesity ([place])   . Postoperative pain   . DVT, lower extremity, distal, acute, bilateral ([place])   . Acute blood loss anemia   . Anemia of chronic disease   . Essential hypertension   . Bacteremia   . Myelopathy ([place]) 03/30/2018  . Paraplegia ([place])   . Postlaminectomy syndrome, lumbar region   . Epidural abscess   . Abdominal distension   . Encephalopathy   . Weakness of both lower extremities   . Discitis of thoracic region   . Spinal cord stimulator status   . Staphylococcus aureus sepsis ([place]) 03/20/2018  . Bacteremia due to methicillin susceptible Staphylococcus aureus  ([facility]) 03/18/2018  . Obesity 03/18/2018  . Nephrolithiasis 03/16/2018  . AKI (acute kidney injury) ([place]) 03/16/2018  . Cognitive impairment 03/16/2018  . Chronic pain syndrome 03/16/2018  . Cervicogenic headache 01/28/2016  . B12 deficiency 01/28/2016  . Memory difficulty 07/23/2015  . History of cerebrovascular disease 07/23/2015  . FH: colon cancer 08/13/2014  . Hx of adenomatous colonic polyps 08/13/2014  . Chronic back pain 05/30/2012  . CAD, NATIVE VESSEL 07/17/2010  . HYPERCHOLESTEROLEMIA 10/31/2009  . SMOKELESS TOBACCO ABUSE 10/31/2009  . Obstructive sleep apnea 10/31/2009  . Essential hypertension, benign 10/31/2009  . GERD 10/31/2009     Talbot Grumbling PT, DPT 07/01/20, 4:23 PM [facility] [address] [place], [place], [address] Phone: [phone]   Fax:  [phone]  Name: DEONDRAE MCGRAIL MRN: [id number] Date of Birth: 1946/06/26

## 2020-07-02 ENCOUNTER — Telehealth (HOSPITAL_COMMUNITY): Payer: Self-pay | Admitting: Physical Therapy

## 2020-07-02 NOTE — Telephone Encounter (Signed)
l/m w/pt he need to request more visit from his DR Patell or Dr. Doretha Sou. Last approved [facility] visit is 07/03/20. Needed to request [id number] per PT.-s/w [facility] / [place] Approved PT 15 visits/04/09/2020-08/07/2020

## 2020-07-03 ENCOUNTER — Ambulatory Visit (HOSPITAL_COMMUNITY): Payer: Non-veteran care | Admitting: Physical Therapy

## 2020-07-03 ENCOUNTER — Telehealth (HOSPITAL_COMMUNITY): Payer: Self-pay | Admitting: Physical Therapy

## 2020-07-03 NOTE — Telephone Encounter (Signed)
Cody Velez called to cx today -he has to go to [place] to get more visits approved. He wants to keep the schedule he has a cx on a day to day base until [name] is recieved from the [place]

## 2020-07-07 ENCOUNTER — Encounter (HOSPITAL_COMMUNITY): Payer: Non-veteran care | Admitting: Physical Therapy

## 2020-07-08 ENCOUNTER — Encounter (HOSPITAL_COMMUNITY): Payer: Non-veteran care | Admitting: Physical Therapy

## 2020-07-10 ENCOUNTER — Ambulatory Visit (HOSPITAL_COMMUNITY): Payer: Non-veteran care | Admitting: Physical Therapy

## 2020-07-10 DIAGNOSIS — R519 Headache, unspecified: Secondary | ICD-10-CM | POA: Diagnosis not present

## 2020-07-15 ENCOUNTER — Encounter (HOSPITAL_COMMUNITY): Payer: Non-veteran care | Admitting: Physical Therapy

## 2020-07-17 ENCOUNTER — Encounter (HOSPITAL_COMMUNITY): Payer: Non-veteran care | Admitting: Physical Therapy

## 2020-08-14 ENCOUNTER — Encounter: Payer: Self-pay | Admitting: Cardiology

## 2020-08-14 ENCOUNTER — Other Ambulatory Visit: Payer: Self-pay

## 2020-08-14 ENCOUNTER — Ambulatory Visit (INDEPENDENT_AMBULATORY_CARE_PROVIDER_SITE_OTHER): Payer: Medicare Other | Admitting: Cardiology

## 2020-08-14 VITALS — BP 150/70 | HR 64 | Ht 75.0 in | Wt 296.8 lb

## 2020-08-14 DIAGNOSIS — E782 Mixed hyperlipidemia: Secondary | ICD-10-CM | POA: Diagnosis not present

## 2020-08-14 DIAGNOSIS — I2510 Atherosclerotic heart disease of native coronary artery without angina pectoris: Secondary | ICD-10-CM

## 2020-08-14 NOTE — Progress Notes (Signed)
Cardiology Office Note  Date: 08/14/2020   ID: Cody Velez, DOB 16-Mar-1946, MRN [id number]  PCP:  Asencion Noble, MD  Cardiologist:  Rozann Lesches, MD Electrophysiologist:  None   Chief Complaint  Patient presents with  . Cardiac follow-up    History of Present Illness: Cody Velez is a 74 y.o. male last assessed via telehealth encounter in February.  He presents for routine visit.  From a cardiac perspective he does not report any obvious angina symptoms.  I reviewed his medications which are outlined below.  Cardiac regimen has been stable.  He has not required any nitroglycerin.  I reviewed his interval ECG from March as outlined below.   Past Medical History:  Diagnosis Date  . Allergic rhinitis   . Arthritis   . B12 deficiency   . Cervicogenic headache   . Childhood asthma   . Chronic back pain   . Coronary atherosclerosis of native coronary artery    Mild nonobstructive CAD at catheterization January 2015  . Dementia ([place])   . Depression   . Essential hypertension   . Falls   . GERD (gastroesophageal reflux disease)   . Glaucoma   . History of blood transfusion   . History of cerebrovascular disease 07/23/2015  . History of kidney stones   . History of pneumonia 02/2011  . Hyperlipidemia   . OSA (obstructive sleep apnea)    CPAP - Dr. [name] - uses cpap every night  . Pneumonia   . Prostate cancer ([place])   . PTSD (post-traumatic stress disorder)    [place]  . Rectal bleeding   . Wheelchair bound     Past Surgical History:  Procedure Laterality Date  . APPLICATION OF ROBOTIC ASSISTANCE FOR SPINAL PROCEDURE N/A 03/28/2018   Procedure: APPLICATION OF ROBOTIC ASSISTANCE FOR SPINAL PROCEDURE;  Surgeon: Kristeen Miss, MD;  Location: [place];  Service: Neurosurgery;  Laterality: N/A;  . BACK SURGERY  02/14/12   lumbar OR #7; "today redid L1L2; replaced screws; added bone from hip"  . BILATERAL KNEE ARTHROSCOPY    . COLONOSCOPY  10/15/2008   Dr. [name]:  tubular adenoma   . COLONOSCOPY  12/17/2003   HUT:MLYYTK rectal and colon  . COLONOSCOPY N/A 09/05/2014   Procedure: COLONOSCOPY;  Surgeon: Daneil Dolin, MD;  [facility];  Service: Endoscopy;  Laterality: N/A;  7:30-rescheduled 9/17 to [place] notified pt  . CYSTOSCOPY WITH STENT PLACEMENT Right 01/27/2016   Procedure: CYSTOSCOPY WITH STENT PLACEMENT;  Surgeon: [name], MD;  [facility];  Service: Urology;  Laterality: Right;  . CYSTOSCOPY/RETROGRADE/URETEROSCOPY/STONE EXTRACTION WITH BASKET Right 01/27/2016   Procedure: CYSTOSCOPY, RIGHT RETROGRADE, RIGHT URETEROSCOPY, STONE EXTRACTION ;  Surgeon: [name], MD;  [facility];  Service: Urology;  Laterality: Right;  . ESOPHAGOGASTRODUODENOSCOPY  10/15/2008     Dr Rourk:Schatzki's ring status post dilation and disruption via 57 F Maloney dilator/ otherwise unremarkable esophagus, small hiatal hernia, multiple fundal gland polyps not manipulated, gastritis, negative H.pylori  . ESOPHAGOGASTRODUODENOSCOPY  06/21/02   PTW:SFKCL sliding hiatal hernia with mild changes of reflux esophagitis limited to gastroesophageal junction.  Noncritical ring at distal esophagus, 3 cm proximal to gastroesophageal junction/Antral gastritis  . [place]   "broke face playing softball"  . FRACTURE SURGERY     "left wrist; broke it; took spur off"  . HOLMIUM LASER APPLICATION Right 01/26/5169   Procedure: HOLMIUM LASER APPLICATION;  Surgeon: [name], MD;  [facility];  Service: Urology;  Laterality: Right;  . KNEE ARTHROSCOPY Right 05/18/2018   Procedure: PARTIAL MEDIAL MENISECTOMY AND SURGICAL LAVAGE AND CHONDROPLASTY;  Surgeon: [name], MD;  Location: [place];  Service: Orthopedics;  Laterality: Right;  . LEFT HEART CATHETERIZATION WITH CORONARY ANGIOGRAM N/A 12/27/2013   Procedure: LEFT HEART CATHETERIZATION WITH CORONARY ANGIOGRAM;  Surgeon: [name], MD;  Location: [facility] CATH  LAB;  Service: Cardiovascular;  Laterality: N/A;  . neck epidural    . POSTERIOR LUMBAR FUSION 4 LEVEL N/A 03/28/2018   Procedure: Thoracic eight -Lumbar two- FIXATION WITH SCREW PLACEMENT, DECOMPRESSION Thoracic ten-Thoracic eleven  FOR OSTEOMYELITIS;  Surgeon: Kristeen Miss, MD;  Location: [place];  Service: Neurosurgery;  Laterality: N/A;  . SHOULDER SURGERY Bilateral   . SPINAL CORD STIMULATOR REMOVAL N/A 02/25/2020   Procedure: LUMBAR SPINAL CORD STIMULATOR REMOVAL;  Surgeon: Kristeen Miss, MD;  Location: [place];  Service: Neurosurgery;  Laterality: N/A;  . TEE WITHOUT CARDIOVERSION N/A 03/21/2018   Procedure: TRANSESOPHAGEAL ECHOCARDIOGRAM (TEE) WITH PROPOFOL;  Surgeon: [name], MD;  [facility];  Service: Cardiovascular;  Laterality: N/A;    Current Outpatient Medications  Medication Sig Dispense Refill  . alfuzosin (UROXATRAL) 10 MG 24 hr tablet Take 10 mg by mouth daily with breakfast.    . amLODipine (NORVASC) 5 MG tablet Take 5 mg by mouth daily.    Marland Kitchen aspirin 81 MG tablet Take 1 tablet (81 mg total) by mouth daily. 30 tablet 0  . baclofen (LIORESAL) 10 MG tablet TAKE ONE TABLET IN THE MORNING AND MIDDAY, THEN TWO TABLETS AT NIGHT 360 each 3  . cycloSPORINE (RESTASIS) 0.05 % ophthalmic emulsion 1 DROP INTO BOTH EYES TWICE A DAY FOR DRY EYES 0.4 mL 0  . docusate sodium (COLACE) 100 MG capsule Take 2 capsules (200 mg total) by mouth 2 (two) times daily. 10 capsule 0  . donepezil (ARICEPT) 10 MG tablet Take 1 tablet (10 mg total) by mouth at bedtime. 30 tablet 0  . finasteride (PROSCAR) 5 MG tablet Take 1 tablet (5 mg total) by mouth daily. Reported on 05/04/2016 30 tablet 0  . fluticasone (FLONASE) 50 MCG/ACT nasal spray Place 1 spray into both nostrils daily as needed for allergies. 16 g 2  . gabapentin (NEURONTIN) 400 MG capsule Take 400 mg by mouth 2 (two) times daily.    Marland Kitchen losartan (COZAAR) 100 MG tablet Take 100 mg by mouth daily.    . Melatonin 10 MG TABS Take 10 mg  by mouth at bedtime. Per pt taking 1/2 tablet of 10 mg.    . meloxicam (MOBIC) 7.5 MG tablet Take 7.5 mg by mouth 2 (two) times daily.    . memantine (NAMENDA) 10 MG tablet Take 1 tablet (10 mg total) by mouth 2 (two) times daily. 60 tablet 0  . omeprazole (PRILOSEC) 20 MG capsule Take 2 capsules (40 mg total) by mouth daily. 30 capsule 0  . Oxycodone HCl 10 MG TABS Take 1 tablet (10 mg total) by mouth every 4 (four) hours as needed (moderate to severe pain). 15 tablet 0  . polyethylene glycol (MIRALAX / GLYCOLAX) packet Take 17 g by mouth daily. 14 each 0  . potassium chloride SA (K-DUR,KLOR-CON) 20 MEQ tablet Take 1 tablet (20 mEq total) by mouth daily. 30 tablet 0  . pravastatin (PRAVACHOL) 10 MG tablet Take 1 tablet (10 mg total) by mouth every evening. 30 tablet 0  . traZODone (DESYREL) 100 MG tablet Take 1 tablet (  100 mg total) by mouth at bedtime. 30 tablet 0  . venlafaxine XR (EFFEXOR-XR) 150 MG 24 hr capsule Take 1 capsule (150 mg total) by mouth 2 (two) times daily. 60 capsule 0   No current facility-administered medications for this visit.   Allergies:  Patient has no known allergies.   ROS:   Neck pain and headaches.  Physical Exam: VS:  BP (!) 150/70   Pulse 64   Ht 6\' 3"  (1.905 m)   Wt 296 lb 12.8 oz (134.6 kg)   SpO2 97%   BMI 37.10 kg/m , BMI Body mass index is 37.1 kg/m.  Wt Readings from Last 3 Encounters:  08/14/20 296 lb 12.8 oz (134.6 kg)  05/20/20 (!) 301 lb (136.5 kg)  04/08/20 (!) 306 lb (138.8 kg)    General: Patient appears comfortable at rest.  Using motorized wheelchair. HEENT: Conjunctiva and lids normal, wearing a mask. Lungs: Clear to auscultation, nonlabored breathing at rest. Cardiac: Regular rate and rhythm, no S3 or significant systolic murmur, no pericardial rub. Extremities: No pitting edema, distal pulses 2+.   ECG:  An ECG dated 02/25/2020 was personally reviewed today and demonstrated:  Sinus rhythm with PACs, incomplete right bundle branch  block, left anterior fascicular block.  Recent Labwork: 02/06/2020: ALT 29; AST 30 02/25/2020: BUN 11; Creatinine, Ser 1.13; Potassium 4.3; Sodium 138 03/14/2020: Hemoglobin 14.2; Platelets 340   Other Studies Reviewed Today:  TEE 03/21/2018: Study Conclusions  - Procedure narrative: Transesophageal echocardiography. Topical anesthesia was obtained using viscous lidocaine. A transesophageal probe was inserted by the attending cardiologist. Image quality was poor. This was a technically difficult study. - Left ventricle: Systolic function was normal. The estimated ejection fraction was in the range of 55% to 60%. - Descending aorta: The descending aorta had moderate diffuse disease. - Mitral valve: There was trivial regurgitation. - Left atrium: No evidence of thrombus in the atrial cavity or appendage. - Tricuspid valve: There was trivial regurgitation. - Pulmonic valve: There was trivial regurgitation.  Impressions:  - Images were overall limited. Valves best visualized with nonstandard views. There were no obvious valvular vegetations.  Assessment and Plan:  1.  CAD, nonobstructive by prior work-up and without active angina symptoms.  Continue observation on medical therapy.  ECG from March reviewed.  Aspirin, Norvasc, Cozaar, and Pravachol.  2.  Mixed hyperlipidemia.  He is on statin therapy with follow-up through the [facility] hospital system.  Medication Adjustments/Labs and Tests Ordered: Current medicines are reviewed at length with the patient today.  Concerns regarding medicines are outlined above.   Tests Ordered: No orders of the defined types were placed in this encounter.   Medication Changes: No orders of the defined types were placed in this encounter.   Disposition:  Follow up 6 months in the [place] office.  Signed, [name], MD, [facility] 08/14/2020 5:08 PM    [name] at [place], [place],   [address] Phone: [phone]; Fax: [phone]

## 2020-08-14 NOTE — Patient Instructions (Addendum)

## 2020-08-20 ENCOUNTER — Telehealth: Payer: Self-pay | Admitting: Urology

## 2020-08-20 NOTE — Telephone Encounter (Signed)
Patient called and state he is a prostate cancer pt and see Dr Diona Fanti but does not have upcoming appointment and isnt sure when he should be seen.

## 2020-08-26 DIAGNOSIS — M5023 Other cervical disc displacement, cervicothoracic region: Secondary | ICD-10-CM | POA: Diagnosis not present

## 2020-08-26 DIAGNOSIS — M542 Cervicalgia: Secondary | ICD-10-CM | POA: Diagnosis not present

## 2020-08-28 NOTE — Telephone Encounter (Signed)
Pt is aware of appointments for October.

## 2020-09-12 DIAGNOSIS — M542 Cervicalgia: Secondary | ICD-10-CM | POA: Diagnosis not present

## 2020-09-18 DIAGNOSIS — M5481 Occipital neuralgia: Secondary | ICD-10-CM | POA: Diagnosis not present

## 2020-09-18 DIAGNOSIS — M542 Cervicalgia: Secondary | ICD-10-CM | POA: Diagnosis not present

## 2020-10-02 ENCOUNTER — Other Ambulatory Visit: Payer: Self-pay

## 2020-10-02 ENCOUNTER — Other Ambulatory Visit: Payer: Medicare Other

## 2020-10-02 DIAGNOSIS — C61 Malignant neoplasm of prostate: Secondary | ICD-10-CM

## 2020-10-03 LAB — PSA: Prostate Specific Ag, Serum: 1.7 ng/mL (ref 0.0–4.0)

## 2020-10-07 ENCOUNTER — Ambulatory Visit (INDEPENDENT_AMBULATORY_CARE_PROVIDER_SITE_OTHER): Payer: Medicare Other | Admitting: Urology

## 2020-10-07 ENCOUNTER — Encounter: Payer: Self-pay | Admitting: Urology

## 2020-10-07 ENCOUNTER — Other Ambulatory Visit: Payer: Self-pay

## 2020-10-07 VITALS — BP 127/71 | HR 44 | Temp 98.7°F | Ht 75.0 in | Wt 297.0 lb

## 2020-10-07 DIAGNOSIS — I2510 Atherosclerotic heart disease of native coronary artery without angina pectoris: Secondary | ICD-10-CM | POA: Diagnosis not present

## 2020-10-07 DIAGNOSIS — C61 Malignant neoplasm of prostate: Secondary | ICD-10-CM | POA: Diagnosis not present

## 2020-10-07 DIAGNOSIS — N138 Other obstructive and reflux uropathy: Secondary | ICD-10-CM

## 2020-10-07 DIAGNOSIS — N401 Enlarged prostate with lower urinary tract symptoms: Secondary | ICD-10-CM | POA: Diagnosis not present

## 2020-10-07 DIAGNOSIS — R351 Nocturia: Secondary | ICD-10-CM

## 2020-10-07 NOTE — Progress Notes (Signed)
Urological Symptom Review  Patient is experiencing the following symptoms: Frequent urination Hard to postpone urination Get up at night to urinate Leakage of urine Stream starts and stops Trouble starting stream Erection problems (male only)   Review of Systems  Gastrointestinal (upper)  : Negative for upper GI symptoms  Gastrointestinal (lower) : Constipation  Constitutional : Negative for symptoms  Skin: Skin rash/lesion Itching  Eyes: Blurred vision  Ear/Nose/Throat : Sinus problems  Hematologic/Lymphatic: Negative for Hematologic/Lymphatic symptoms  Cardiovascular : Leg swelling  Respiratory : Negative for respiratory symptoms  Endocrine: Negative for endocrine symptoms  Musculoskeletal: Back pain  Neurological: Negative for neurological symptoms  Psychologic: Negative for psychiatric symptoms

## 2020-10-07 NOTE — Progress Notes (Signed)
H&P  Chief Complaint: Prostate Cancer  History of Present Illness:  10.19.2021: Pt here today for F/U of his PCa, on AS. Most recent PSA was measure at 1.7. Pt continues on finasteride.   Pt experiencing nocturia. Pt denies drinking much soda in the pm but does experience peripheral edema.  Pt still experiencing ED symptomatology but has not been able to engage in intercourse due to complications following his spinal stimulator implantation.  IPSS Questionnaire (AUA-7): Over the past month.   1)  How often have you had a sensation of not emptying your bladder completely after you finish urinating?  4 - More than half the time  2)  How often have you had to urinate again less than two hours after you finished urinating? 5 - Almost always  3)  How often have you found you stopped and started again several times when you urinated?  4 - More than half the time  4) How difficult have you found it to postpone urination?  4 - More than half the time  5) How often have you had a weak urinary stream?  2 - Less than half the time  6) How often have you had to push or strain to begin urination?  0 - Not at all  7) How many times did you most typically get up to urinate from the time you went to bed until the time you got up in the morning?  2 - 2 times  Total score:  0-7 mildly symptomatic   8-19 moderately symptomatic   20-35 severely symptomatic  QOL score: 4    TRUS/Bx March, 2016. At that time, 2 cores at the left apex revealed relatively low volume GS 3+3 pattern. Prostatic volume 65 ml. PSA 4.1. PSAD 0.06. Oncotype DX was performed at that time, as we anticipated active surveillance. This revealed a GPS score 11, favorable/low risk prostate cancer. Less than 5% chance of having high-grade disease/extraprostatic disease.  Repeat TRUS/Bx in February, 2017. Only one core at the left apex at that time revealed GS 3+3 , only 5% of core involved.   He was started on finasteride on 4.25.2016.      6.16.2020: In March of 2019 he became significantly sick due to what sounds like an epidural abscess of his thoracic spine. He was hospitalized for about 3 months. This is his 1st visit since then. He has not had recent PSA. Urinary symptoms are moderately bothersome. IPSS 13, quality of Life score 6. He is on finasteride but not Uroxatral which he was on in the past.   ED:  6.16.2020: He first stated noticing pain on approximately 03/21/2011. His symptoms did begin gradually. His symptoms have been stable over the last year. He does not have difficulties achieving an erection. He does have problems maintaining his erections. His erections are straight. He does not have trouble reaching climax.   PDE 5 inhibitors have not worked. He is not currently on med Rx for ED.    Past Medical History:  Diagnosis Date  . Allergic rhinitis   . Arthritis   . B12 deficiency   . Cervicogenic headache   . Childhood asthma   . Chronic back pain   . Coronary atherosclerosis of native coronary artery    Mild nonobstructive CAD at catheterization January 2015  . Dementia ([place])   . Depression   . Essential hypertension   . Falls   . GERD (gastroesophageal reflux disease)   . Glaucoma   . History  of blood transfusion   . History of cerebrovascular disease 07/23/2015  . History of kidney stones   . History of pneumonia 02/2011  . Hyperlipidemia   . OSA (obstructive sleep apnea)    CPAP - Dr. [name] - uses cpap every night  . Pneumonia   . Prostate cancer ([place])   . PTSD (post-traumatic stress disorder)    [place]  . Rectal bleeding   . Wheelchair bound     Past Surgical History:  Procedure Laterality Date  . APPLICATION OF ROBOTIC ASSISTANCE FOR SPINAL PROCEDURE N/A 03/28/2018   Procedure: APPLICATION OF ROBOTIC ASSISTANCE FOR SPINAL PROCEDURE;  Surgeon: Kristeen Miss, MD;  Location: [place];  Service: Neurosurgery;  Laterality: N/A;  . BACK SURGERY  02/14/12   lumbar OR #7; "today redid L1L2;  replaced screws; added bone from hip"  . BILATERAL KNEE ARTHROSCOPY    . COLONOSCOPY  10/15/2008   Dr. [name]: tubular adenoma   . COLONOSCOPY  12/17/2003   GMW:NUUVOZ rectal and colon  . COLONOSCOPY N/A 09/05/2014   Procedure: COLONOSCOPY;  Surgeon: Daneil Dolin, MD;  [facility];  Service: Endoscopy;  Laterality: N/A;  7:30-rescheduled 9/17 to [place] notified pt  . CYSTOSCOPY WITH STENT PLACEMENT Right 01/27/2016   Procedure: CYSTOSCOPY WITH STENT PLACEMENT;  Surgeon: [name], MD;  [facility];  Service: Urology;  Laterality: Right;  . CYSTOSCOPY/RETROGRADE/URETEROSCOPY/STONE EXTRACTION WITH BASKET Right 01/27/2016   Procedure: CYSTOSCOPY, RIGHT RETROGRADE, RIGHT URETEROSCOPY, STONE EXTRACTION ;  Surgeon: [name], MD;  [facility];  Service: Urology;  Laterality: Right;  . ESOPHAGOGASTRODUODENOSCOPY  10/15/2008     Dr Rourk:Schatzki's ring status post dilation and disruption via 64 F Maloney dilator/ otherwise unremarkable esophagus, small hiatal hernia, multiple fundal gland polyps not manipulated, gastritis, negative H.pylori  . ESOPHAGOGASTRODUODENOSCOPY  06/21/02   DGU:YQIHK sliding hiatal hernia with mild changes of reflux esophagitis limited to gastroesophageal junction.  Noncritical ring at distal esophagus, 3 cm proximal to gastroesophageal junction/Antral gastritis  . [place]   "broke face playing softball"  . FRACTURE SURGERY     "left wrist; broke it; took spur off"  . HOLMIUM LASER APPLICATION Right 06/22/2594   Procedure: HOLMIUM LASER APPLICATION;  Surgeon: [name], MD;  [facility];  Service: Urology;  Laterality: Right;  . KNEE ARTHROSCOPY Right 05/18/2018   Procedure: PARTIAL MEDIAL MENISECTOMY AND SURGICAL LAVAGE AND CHONDROPLASTY;  Surgeon: [name], MD;  Location: [place];  Service: Orthopedics;  Laterality: Right;  . LEFT HEART CATHETERIZATION WITH CORONARY ANGIOGRAM N/A 12/27/2013    Procedure: LEFT HEART CATHETERIZATION WITH CORONARY ANGIOGRAM;  Surgeon: [name], MD;  Location: [facility] C[facility];  Service: Cardiovascular;  Laterality: N/A;  . neck epidural    . POSTERIOR LUMBAR FUSION 4 LEVEL N/A 03/28/2018   Procedure: Thoracic eight -Lumbar two- FIXATION WITH SCREW PLACEMENT, DECOMPRESSION Thoracic ten-Thoracic eleven  FOR OSTEOMYELITIS;  Surgeon: Kristeen Miss, MD;  Location: [place];  Service: Neurosurgery;  Laterality: N/A;  . SHOULDER SURGERY Bilateral   . SPINAL CORD STIMULATOR REMOVAL N/A 02/25/2020   Procedure: LUMBAR SPINAL CORD STIMULATOR REMOVAL;  Surgeon: Kristeen Miss, MD;  Location: [place];  Service: Neurosurgery;  Laterality: N/A;  . TEE WITHOUT CARDIOVERSION N/A 03/21/2018   Procedure: TRANSESOPHAGEAL ECHOCARDIOGRAM (TEE) WITH PROPOFOL;  Surgeon: [name], MD;  [facility];  Service: Cardiovascular;  Laterality: N/A;    Home Medications:  Allergies as of 10/07/2020   No Known  Allergies     Medication List       Accurate as of October 07, 2020 12:50 PM. If you have any questions, ask your nurse or doctor.        alfuzosin 10 MG 24 hr tablet Commonly known as: UROXATRAL Take 10 mg by mouth daily with breakfast.   amLODipine 5 MG tablet Commonly known as: NORVASC Take 5 mg by mouth daily.   aspirin 81 MG tablet Take 1 tablet (81 mg total) by mouth daily.   baclofen 10 MG tablet Commonly known as: LIORESAL TAKE ONE TABLET IN THE MORNING AND MIDDAY, THEN TWO TABLETS AT NIGHT   cycloSPORINE 0.05 % ophthalmic emulsion Commonly known as: RESTASIS 1 DROP INTO BOTH EYES TWICE A DAY FOR DRY EYES   docusate sodium 100 MG capsule Commonly known as: COLACE Take 2 capsules (200 mg total) by mouth 2 (two) times daily.   donepezil 10 MG tablet Commonly known as: ARICEPT Take 1 tablet (10 mg total) by mouth at bedtime.   finasteride 5 MG tablet Commonly known as: PROSCAR Take 1 tablet (5 mg total) by mouth daily. Reported on  05/04/2016   fluticasone 50 MCG/ACT nasal spray Commonly known as: FLONASE Place 1 spray into both nostrils daily as needed for allergies.   gabapentin 400 MG capsule Commonly known as: NEURONTIN Take 400 mg by mouth 2 (two) times daily.   losartan 100 MG tablet Commonly known as: COZAAR Take 100 mg by mouth daily.   Melatonin 10 MG Tabs Take 10 mg by mouth at bedtime. Per pt taking 1/2 tablet of 10 mg.   meloxicam 7.5 MG tablet Commonly known as: MOBIC Take 7.5 mg by mouth 2 (two) times daily.   memantine 10 MG tablet Commonly known as: NAMENDA Take 1 tablet (10 mg total) by mouth 2 (two) times daily.   omeprazole 20 MG capsule Commonly known as: PRILOSEC Take 2 capsules (40 mg total) by mouth daily.   Oxycodone HCl 10 MG Tabs Take 1 tablet (10 mg total) by mouth every 4 (four) hours as needed (moderate to severe pain).   polyethylene glycol 17 g packet Commonly known as: MIRALAX / GLYCOLAX Take 17 g by mouth daily.   potassium chloride SA 20 MEQ tablet Commonly known as: KLOR-CON Take 1 tablet (20 mEq total) by mouth daily.   pravastatin 10 MG tablet Commonly known as: PRAVACHOL Take 1 tablet (10 mg total) by mouth every evening.   traZODone 100 MG tablet Commonly known as: DESYREL Take 1 tablet (100 mg total) by mouth at bedtime.   venlafaxine XR 150 MG 24 hr capsule Commonly known as: EFFEXOR-XR Take 1 capsule (150 mg total) by mouth 2 (two) times daily.       Allergies: No Known Allergies  Family History  Problem Relation Age of Onset  . Emphysema Father   . Heart failure Father   . Lung cancer Father   . CAD Father   . Colon cancer Mother   . Stroke Mother   . Breast cancer Mother   . Stroke Sister   . Heart attack Brother   . Dementia Paternal Uncle   . Emphysema Maternal Grandmother   . Stroke Maternal Grandmother   . Asthma Other        grandson  . Heart disease Paternal Grandfather   . Anesthesia problems Neg Hx   . Hypotension Neg  Hx   . Malignant hyperthermia Neg Hx   . Pseudochol deficiency Neg Hx  Social History:  reports that he quit smoking about 61 years ago. His smoking use included cigarettes. His smokeless tobacco use includes chew. He reports previous alcohol use. He reports that he does not use drugs.  ROS: A complete review of systems was performed.  All systems are negative except for pertinent findings as noted.  Physical Exam:  Vital signs in last 24 hours: There were no vitals taken for this visit. Constitutional:  Alert and oriented, No acute distress. In wheelchair. Cardiovascular: Regular rate  Respiratory: Normal respiratory effort  Genitourinary:Prostate feels about 50 grams. Neurologic: Grossly intact, no focal deficits Psychiatric: Normal mood and affect  I have reviewed prior pt notes  I have reviewed urinalysis results  I have independently reviewed prior imaging  I have reviewed prior PSA/pathology results  Impression/Assessment:  1. PCa--on AS. Very low risk.  PSA stable and DRE normal at this time. Pt presents no concerns at this time and will be continued on active surveillance.  2. Pt is experiencing ED but does not require treatment at this time.  3. Pt is experiencing nocturia and is voiding a significant volume at night. This is likely secondary to his peripheral edema.  Plan:  1. Pt reassured regarding his PSA results and continued on active surveillance for PCa.  2. Pt advised regarding self-help measures for mitigating his nocturia symptomatology - limit afternoon fluid intake, reduce sodium intake, and wear compression socks.  3. F/U in 1 year for OV, PSA, and symptom recheck. Consider MRI prostate at that time.  CC: Dr. Asencion Noble

## 2020-10-15 ENCOUNTER — Telehealth: Payer: Self-pay | Admitting: *Deleted

## 2020-10-15 NOTE — Telephone Encounter (Signed)
Lenna Sciara, nurse at [facility] faxed note requesting notes from office visits pertaining to blood infection in 2019. RN sent. Landis Gandy, RN

## 2020-10-16 ENCOUNTER — Encounter: Payer: Self-pay | Admitting: Neurology

## 2020-10-16 ENCOUNTER — Ambulatory Visit (INDEPENDENT_AMBULATORY_CARE_PROVIDER_SITE_OTHER): Payer: Medicare Other | Admitting: Neurology

## 2020-10-16 ENCOUNTER — Other Ambulatory Visit: Payer: Self-pay

## 2020-10-16 VITALS — BP 134/80 | Ht 75.0 in | Wt 299.8 lb

## 2020-10-16 DIAGNOSIS — G894 Chronic pain syndrome: Secondary | ICD-10-CM | POA: Diagnosis not present

## 2020-10-16 DIAGNOSIS — R413 Other amnesia: Secondary | ICD-10-CM

## 2020-10-16 DIAGNOSIS — G959 Disease of spinal cord, unspecified: Secondary | ICD-10-CM | POA: Diagnosis not present

## 2020-10-16 NOTE — Progress Notes (Signed)
PATIENT: Cody Velez DOB: December 30, 1945  REASON FOR VISIT: follow up HISTORY FROM: patient  HISTORY OF PRESENT ILLNESS: Today 10/16/20 Cody Velez is a 74 year old male with history of chronic neck pain and headache.  He has low back issues, had a spinal stimulator placed in [id number], had complication with MRSA infection that resulted in epidural abscess and thoracic myelopathy.  Is left with significant numbness of both legs.  Has urgency of the bowels and bladder, has routine follow-up with urology, PSA has been stable, under active surveillance.  In the last year, had spinal stimulator removed by Dr. Ellene Route, he is glad about this, but has had more pain. Had what sounds like occipital nerve block to the left recently by pain management at [facility], for neck and head pain, with good benefit.   Memory is overall stable, continues to leave the room, forget what he was going for.  Is on Aricept and Namenda through the [place].  During his illness, he had a significant delirium state, his memory has not fully recovered.  Has noted some restless movement of his hands during the day, fidgety, his feet and legs at night, he is often not aware, doesn't bother him much, or keep him awake, been present since around 2019.  He lives with his girlfriend.  He has a Engineer, manufacturing systems, does not do much walking with a walker.  Presents today for evaluation accompanied by his girlfriend.  HISTORY 10/17/2019 Dr. Jannifer Franklin: Cody Velez is a 74 year old left-handed white male with a history of chronic neck pain and headache.  The patient also had low back pain issues, and in early 2019 he had a spinal stimulator placed but he had a complication with a MRSA infection that resulted in an epidural abscess and a thoracic myelopathy.  The patient has required extensive physical therapy, he is able to ambulate a short distance with a walker, but he is left with significant numbness of both legs.  He has some  urinary urgency and urgency of the bowel as well.  He has required long-term treatment with antibiotics.  During his severe illness with sepsis, he had a delirium state, he feels that his memory issues have not fully returned following this.  The patient however has not given up any activities of daily living because of the his memory issues.  He remains cognitively active.  He returns to the office today for an evaluation.  REVIEW OF SYSTEMS: Out of a complete 14 system review of symptoms, the patient complains only of the following symptoms, and all other reviewed systems are negative.  Memory loss  ALLERGIES: No Known Allergies  HOME MEDICATIONS: Outpatient Medications Prior to Visit  Medication Sig Dispense Refill  . alfuzosin (UROXATRAL) 10 MG 24 hr tablet Take 10 mg by mouth daily with breakfast.    . amLODipine (NORVASC) 5 MG tablet Take 5 mg by mouth daily.    Marland Kitchen aspirin 81 MG tablet Take 1 tablet (81 mg total) by mouth daily. 30 tablet 0  . baclofen (LIORESAL) 10 MG tablet TAKE ONE TABLET IN THE MORNING AND MIDDAY, THEN TWO TABLETS AT NIGHT 360 each 3  . cycloSPORINE (RESTASIS) 0.05 % ophthalmic emulsion 1 DROP INTO BOTH EYES TWICE A DAY FOR DRY EYES 0.4 mL 0  . docusate sodium (COLACE) 100 MG capsule Take 2 capsules (200 mg total) by mouth 2 (two) times daily. 10 capsule 0  . donepezil (ARICEPT) 10 MG tablet Take 1 tablet (10 mg total) by  mouth at bedtime. 30 tablet 0  . finasteride (PROSCAR) 5 MG tablet Take 1 tablet (5 mg total) by mouth daily. Reported on 05/04/2016 30 tablet 0  . fluticasone (FLONASE) 50 MCG/ACT nasal spray Place 1 spray into both nostrils daily as needed for allergies. 16 g 2  . gabapentin (NEURONTIN) 400 MG capsule Take 400 mg by mouth 2 (two) times daily.    Marland Kitchen losartan (COZAAR) 100 MG tablet Take 100 mg by mouth daily.    . Melatonin 10 MG TABS Take 10 mg by mouth at bedtime. Per pt taking 1/2 tablet of 10 mg.    . meloxicam (MOBIC) 7.5 MG tablet Take 7.5 mg by  mouth 2 (two) times daily.    . memantine (NAMENDA) 10 MG tablet Take 1 tablet (10 mg total) by mouth 2 (two) times daily. 60 tablet 0  . omeprazole (PRILOSEC) 20 MG capsule Take 2 capsules (40 mg total) by mouth daily. 30 capsule 0  . Oxycodone HCl 10 MG TABS Take 1 tablet (10 mg total) by mouth every 4 (four) hours as needed (moderate to severe pain). 15 tablet 0  . polyethylene glycol (MIRALAX / GLYCOLAX) packet Take 17 g by mouth daily. 14 each 0  . pravastatin (PRAVACHOL) 10 MG tablet Take 1 tablet (10 mg total) by mouth every evening. 30 tablet 0  . traZODone (DESYREL) 100 MG tablet Take 1 tablet (100 mg total) by mouth at bedtime. 30 tablet 0  . venlafaxine XR (EFFEXOR-XR) 150 MG 24 hr capsule Take 1 capsule (150 mg total) by mouth 2 (two) times daily. 60 capsule 0  . potassium chloride SA (K-DUR,KLOR-CON) 20 MEQ tablet Take 1 tablet (20 mEq total) by mouth daily. 30 tablet 0   No facility-administered medications prior to visit.    PAST MEDICAL HISTORY: Past Medical History:  Diagnosis Date  . Allergic rhinitis   . Arthritis   . B12 deficiency   . Cervicogenic headache   . Childhood asthma   . Chronic back pain   . Coronary atherosclerosis of native coronary artery    Mild nonobstructive CAD at catheterization January 2015  . Dementia ([place])   . Depression   . Essential hypertension   . Falls   . GERD (gastroesophageal reflux disease)   . Glaucoma   . History of blood transfusion   . History of cerebrovascular disease 07/23/2015  . History of kidney stones   . History of pneumonia 02/2011  . Hyperlipidemia   . OSA (obstructive sleep apnea)    CPAP - Dr. [name] - uses cpap every night  . Pneumonia   . Prostate cancer ([place])   . PTSD (post-traumatic stress disorder)    [place]  . Rectal bleeding   . Wheelchair bound     PAST SURGICAL HISTORY: Past Surgical History:  Procedure Laterality Date  . APPLICATION OF ROBOTIC ASSISTANCE FOR SPINAL PROCEDURE N/A 03/28/2018    Procedure: APPLICATION OF ROBOTIC ASSISTANCE FOR SPINAL PROCEDURE;  Surgeon: Kristeen Miss, MD;  Location: [facility];  Service: Neurosurgery;  Laterality: N/A;  . BACK SURGERY  02/14/12   lumbar OR #7; "today redid L1L2; replaced screws; added bone from hip"  . BILATERAL KNEE ARTHROSCOPY    . COLONOSCOPY  10/15/2008   Dr. [name]: tubular adenoma   . COLONOSCOPY  12/17/2003   NFA:[id number] rectal and colon  . COLONOSCOPY N/A 09/05/2014   Procedure: COLONOSCOPY;  Surgeon: Daneil Dolin, MD;  [facility];  Service: Endoscopy;  Laterality: N/A;  7:30-rescheduled 9/17 to [place] notified pt  . CYSTOSCOPY WITH STENT PLACEMENT Right 01/27/2016   Procedure: CYSTOSCOPY WITH STENT PLACEMENT;  Surgeon: [name], MD;  Location: [facility];  Service: Urology;  Laterality: Right;  . CYSTOSCOPY/RETROGRADE/URETEROSCOPY/STONE EXTRACTION WITH BASKET Right 01/27/2016   Procedure: CYSTOSCOPY, RIGHT RETROGRADE, RIGHT URETEROSCOPY, STONE EXTRACTION ;  Surgeon: [name], MD;  Location[facility];  Service: Urology;  Laterality: Right;  . ESOPHAGOGASTRODUODENOSCOPY  10/15/2008     Dr Rourk:Schatzki's ring status post dilation and disruption via 51 F Maloney dilator/ otherwise unremarkable esophagus, small hiatal hernia, multiple fundal gland polyps not manipulated, gastritis, negative H.pylori  . ESOPHAGOGASTRODUODENOSCOPY  06/21/02   ZHY:QMVHQ sliding hiatal hernia with mild changes of reflux esophagitis limited to gastroesophageal junction.  Noncritical ring at distal esophagus, 3 cm proximal to gastroesophageal junction/Antral gastritis  . [place]   "broke face playing softball"  . FRACTURE SURGERY     "left wrist; broke it; took spur off"  . HOLMIUM LASER APPLICATION Right 03/25/9628   Procedure: HOLMIUM LASER APPLICATION;  Surgeon: [name], MD;  Location: [facility];  Service: Urology;  Laterality: Right;  . KNEE ARTHROSCOPY Right 05/18/2018   Procedure: PARTIAL  MEDIAL MENISECTOMY AND SURGICAL LAVAGE AND CHONDROPLASTY;  Surgeon: [name], MD;  Location: [place];  Service: Orthopedics;  Laterality: Right;  . LEFT HEART CATHETERIZATION WITH CORONARY ANGIOGRAM N/A 12/27/2013   Procedure: LEFT HEART CATHETERIZATION WITH CORONARY ANGIOGRAM;  Surgeon: [name], MD;  Location: [facility];  Service: Cardiovascular;  Laterality: N/A;  . neck epidural    . POSTERIOR LUMBAR FUSION 4 LEVEL N/A 03/28/2018   Procedure: Thoracic eight -Lumbar two- FIXATION WITH SCREW PLACEMENT, DECOMPRESSION Thoracic ten-Thoracic eleven  FOR OSTEOMYELITIS;  Surgeon: Kristeen Miss, MD;  Location: [place];  Service: Neurosurgery;  Laterality: N/A;  . SHOULDER SURGERY Bilateral   . SPINAL CORD STIMULATOR REMOVAL N/A 02/25/2020   Procedure: LUMBAR SPINAL CORD STIMULATOR REMOVAL;  Surgeon: Kristeen Miss, MD;  Location: [place];  Service: Neurosurgery;  Laterality: N/A;  . TEE WITHOUT CARDIOVERSION N/A 03/21/2018   Procedure: TRANSESOPHAGEAL ECHOCARDIOGRAM (TEE) WITH PROPOFOL;  Surgeon: [name], MD;  [facility];  Service: Cardiovascular;  Laterality: N/A;    FAMILY HISTORY: Family History  Problem Relation Age of Onset  . Emphysema Father   . Heart failure Father   . Lung cancer Father   . CAD Father   . Colon cancer Mother   . Stroke Mother   . Breast cancer Mother   . Stroke Sister   . Heart attack Brother   . Dementia Paternal Uncle   . Emphysema Maternal Grandmother   . Stroke Maternal Grandmother   . Asthma Other        grandson  . Heart disease Paternal Grandfather   . Anesthesia problems Neg Hx   . Hypotension Neg Hx   . Malignant hyperthermia Neg Hx   . Pseudochol deficiency Neg Hx     SOCIAL HISTORY: Social History   Socioeconomic History  . Marital status: Widowed    Spouse name: Not on file  . Number of children: 3  . Years of education: Not on file  . Highest education level: Not on file  Occupational History  . Occupation: Print production planner     Employer: [employer]  Tobacco Use  . Smoking status: Former Smoker    Types: Cigarettes    Quit date: 12/20/1958    Years since quitting: 61.8  .  Smokeless tobacco: Current User    Types: Chew  Vaping Use  . Vaping Use: Never used  Substance and Sexual Activity  . Alcohol use: Not Currently    Alcohol/week: 0.0 standard drinks    Comment: couple bottles of wine per month  . Drug use: No    Types: Marijuana    Comment: "last used marijuana ~ 1969"  . Sexual activity: Not on file  Other Topics Concern  . Not on file  Social History Narrative   Married, 3 daughters; employed with Print production planner at [employer].      Patient drinks about 1 cup of coffee daily.   Patient is left handed.   Social Determinants of Health   Financial Resource Strain:   . Difficulty of Paying Living Expenses: Not on file  Food Insecurity:   . Worried About Charity fundraiser in the Last Year: Not on file  . Ran Out of Food in the Last Year: Not on file  Transportation Needs:   . Lack of Transportation (Medical): Not on file  . Lack of Transportation (Non-Medical): Not on file  Physical Activity:   . Days of Exercise per Week: Not on file  . Minutes of Exercise per Session: Not on file  Stress:   . Feeling of Stress : Not on file  Social Connections:   . Frequency of Communication with Friends and Family: Not on file  . Frequency of Social Gatherings with Friends and Family: Not on file  . Attends Religious Services: Not on file  . Active Member of Clubs or Organizations: Not on file  . Attends Archivist Meetings: Not on file  . Marital Status: Not on file  Intimate Partner Violence:   . Fear of Current or Ex-Partner: Not on file  . Emotionally Abused: Not on file  . Physically Abused: Not on file  . Sexually Abused: Not on file   PHYSICAL EXAM  Vitals:   10/16/20 1251  BP: 134/80  Weight: 299 lb 12.8 oz (136 kg)  Height: 6\' 3"  (1.905 m)   Body mass index is 37.47  kg/m.  Generalized: Well developed, in no acute distress  MMSE - Mini Mental State Exam 10/16/2020 10/17/2019 11/17/2017  Orientation to time 5 5 5   Orientation to Place 5 5 5   Registration 3 3 3   Attention/ Calculation 5 5 5   Recall 3 3 2   Language- name 2 objects 2 2 2   Language- repeat 1 1 1   Language- follow 3 step command 3 3 3   Language- read & follow direction 1 1 1   Write a sentence 1 1 1   Copy design 1 1 1   Total score 30 30 29     Neurological examination  Mentation: Alert oriented to time, place, history taking. Follows all commands speech and language fluent Cranial nerve II-XII: Pupils were equal round reactive to light. Extraocular movements were full, visual field were full on confrontational test. Facial sensation and strength were normal.  Head turning and shoulder shrug  were normal and symmetric. Motor: Good strength of all extremities.  Periodic fidgety hand movement was noted like hand flexure, none in the feet,  Sensory: Sensory testing is intact to soft touch on all 4 extremities. No evidence of extinction is noted.  Coordination: Decreased sensation to soft touch BLEs Gait and station: Was not ambulated Reflexes: Deep tendon reflexes are symmetric    DIAGNOSTIC DATA (LABS, IMAGING, TESTING) - I reviewed patient records, labs, notes, testing and imaging  myself where available.  Lab Results  Component Value Date   WBC 6.2 03/14/2020   HGB 14.2 03/14/2020   HCT 42.7 03/14/2020   MCV 84 03/14/2020   PLT 340 03/14/2020      Component Value Date/Time   NA 138 02/25/2020 1108   NA 139 02/06/2020 1814   K 4.3 02/25/2020 1108   CL 106 02/25/2020 1108   CO2 23 02/25/2020 1108   GLUCOSE 83 02/25/2020 1108   BUN 11 02/25/2020 1108   BUN 6 (L) 02/06/2020 1814   CREATININE 1.13 02/25/2020 1108   CREATININE 1.10 01/18/2019 1120   CALCIUM 9.4 02/25/2020 1108   PROT 7.1 02/06/2020 1814   ALBUMIN 3.8 02/06/2020 1814   AST 30 02/06/2020 1814   ALT 29  02/06/2020 1814   ALKPHOS 111 02/06/2020 1814   BILITOT <0.2 02/06/2020 1814   GFRNONAA >60 02/25/2020 1108   GFRAA >60 02/25/2020 1108   Lab Results  Component Value Date   CHOL 175 05/30/2012   HDL 28 (L) 05/30/2012   LDLCALC 108 (H) 05/30/2012   TRIG 194 (H) 05/30/2012   CHOLHDL 6.3 05/30/2012   No results found for: HGBA1C Lab Results  Component Value Date   VITAMINB12 794 05/09/2018   Lab Results  Component Value Date   TSH 0.867 05/09/2018   ASSESSMENT AND PLAN 74 y.o. year old male  has a past medical history of Allergic rhinitis, Arthritis, B12 deficiency, Cervicogenic headache, Childhood asthma, Chronic back pain, Coronary atherosclerosis of native coronary artery, Dementia ([place]), Depression, Essential hypertension, Falls, GERD (gastroesophageal reflux disease), Glaucoma, History of blood transfusion, History of cerebrovascular disease (07/23/2015), History of kidney stones, History of pneumonia (02/2011), Hyperlipidemia, OSA (obstructive sleep apnea), Pneumonia, Prostate cancer ([place]), PTSD (post-traumatic stress disorder), Rectal bleeding, and Wheelchair bound. here with:  1.  Mild cognitive impairment 2.  Thoracic myelopathy, gait disorder 3.  Chronic neck and low back pain -MMSE was 30/30 today -Continue Aricept and Namenda through the [place] -In the last year, has spinal stimulator removed, doing injections at pain clinic with [facility] [place] [facility] -He has some fidgety movements of both hands, what sounds like restless legs at night, does not keep him awake, or impair daily activity, has been present since around 2019, will follow over time -Follow-up 1 year or sooner if needed, has routine follow-up with neurosurgery  I spent 30 minutes of face-to-face and non-face-to-face time with patient.  This included previsit chart review, lab review, study review, order entry, electronic health record documentation, patient education.  Butler Denmark, AGNP-C, DNP 10/16/2020,  12:59 PM [facility] [address] [place], [place] [address] [phone]

## 2020-10-16 NOTE — Patient Instructions (Addendum)
We'll continue current medications  Continue seeing your specialists  See you back in 1 year or sooner if needed

## 2020-10-16 NOTE — Progress Notes (Signed)
I have read the note, and I agree with the clinical assessment and plan.  Mirage Pfefferkorn K Aldean Suddeth   

## 2020-10-23 DIAGNOSIS — Z23 Encounter for immunization: Secondary | ICD-10-CM | POA: Diagnosis not present

## 2020-10-29 DIAGNOSIS — C61 Malignant neoplasm of prostate: Secondary | ICD-10-CM | POA: Diagnosis not present

## 2020-10-29 DIAGNOSIS — R1031 Right lower quadrant pain: Secondary | ICD-10-CM | POA: Diagnosis not present

## 2020-10-30 ENCOUNTER — Ambulatory Visit (INDEPENDENT_AMBULATORY_CARE_PROVIDER_SITE_OTHER): Payer: Medicare Other | Admitting: Internal Medicine

## 2020-10-30 ENCOUNTER — Other Ambulatory Visit: Payer: Self-pay

## 2020-10-30 ENCOUNTER — Other Ambulatory Visit (HOSPITAL_COMMUNITY): Payer: Self-pay | Admitting: Internal Medicine

## 2020-10-30 ENCOUNTER — Encounter: Payer: Self-pay | Admitting: Internal Medicine

## 2020-10-30 DIAGNOSIS — M4644 Discitis, unspecified, thoracic region: Secondary | ICD-10-CM | POA: Diagnosis not present

## 2020-10-30 DIAGNOSIS — I2510 Atherosclerotic heart disease of native coronary artery without angina pectoris: Secondary | ICD-10-CM

## 2020-10-30 DIAGNOSIS — R1031 Right lower quadrant pain: Secondary | ICD-10-CM

## 2020-10-30 NOTE — Assessment & Plan Note (Signed)
I think it is relatively unlikely that his vertebral infection has relapsed at this point.  I see no indication to start empiric antibiotic therapy.  I will check CBC, BMP, sed rate and C-reactive protein.

## 2020-10-30 NOTE — Progress Notes (Signed)
[facility]  Patient Active Problem List   Diagnosis Date Noted  . Infection of spinal cord stimulator ([place]) 02/26/2020    Priority: High  . Discitis of thoracic region     Priority: High  . Bacteremia due to methicillin susceptible Staphylococcus aureus (MSSA) 03/18/2018    Priority: High  . Pseudogout of knee   . Pyogenic arthritis of right knee joint ([place])   . Falls frequently 05/08/2018  . Fall   . Sleep disturbance   . Post-op pain   . Hypokalemia   . Morbid obesity ([place])   . Postoperative pain   . DVT, lower extremity, distal, acute, bilateral ([place])   . Acute blood loss anemia   . Anemia of chronic disease   . Essential hypertension   . Bacteremia   . Myelopathy ([place]) 03/30/2018  . Paraplegia ([place])   . Postlaminectomy syndrome, lumbar region   . Epidural abscess   . Abdominal distension   . Encephalopathy   . Weakness of both lower extremities   . Spinal cord stimulator status   . Staphylococcus aureus sepsis ([place]) 03/20/2018  . Obesity 03/18/2018  . Nephrolithiasis 03/16/2018  . AKI (acute kidney injury) ([place]) 03/16/2018  . Cognitive impairment 03/16/2018  . Chronic pain syndrome 03/16/2018  . Cervicogenic headache 01/28/2016  . B12 deficiency 01/28/2016  . Memory difficulty 07/23/2015  . History of cerebrovascular disease 07/23/2015  . FH: colon cancer 08/13/2014  . Hx of adenomatous colonic polyps 08/13/2014  . Chronic back pain 05/30/2012  . CAD, NATIVE VESSEL 07/17/2010  . HYPERCHOLESTEROLEMIA 10/31/2009  . SMOKELESS TOBACCO ABUSE 10/31/2009  . Obstructive sleep apnea 10/31/2009  . Essential hypertension, benign 10/31/2009  . GERD 10/31/2009    Patient's Medications  New Prescriptions   No medications on file  Previous Medications   ALFUZOSIN (UROXATRAL) 10 MG 24 HR TABLET    Take 10 mg by mouth daily with breakfast.   AMLODIPINE (NORVASC) 5 MG TABLET    Take 5 mg by mouth daily.   ASPIRIN 81 MG TABLET     Take 1 tablet (81 mg total) by mouth daily.   BACLOFEN (LIORESAL) 10 MG TABLET    TAKE ONE TABLET IN THE MORNING AND MIDDAY, THEN TWO TABLETS AT NIGHT   CYCLOSPORINE (RESTASIS) 0.05 % OPHTHALMIC EMULSION    1 DROP INTO BOTH EYES TWICE A DAY FOR DRY EYES   DOCUSATE SODIUM (COLACE) 100 MG CAPSULE    Take 2 capsules (200 mg total) by mouth 2 (two) times daily.   DONEPEZIL (ARICEPT) 10 MG TABLET    Take 1 tablet (10 mg total) by mouth at bedtime.   FINASTERIDE (PROSCAR) 5 MG TABLET    Take 1 tablet (5 mg total) by mouth daily. Reported on 05/04/2016   FLUTICASONE (FLONASE) 50 MCG/ACT NASAL SPRAY    Place 1 spray into both nostrils daily as needed for allergies.   GABAPENTIN (NEURONTIN) 400 MG CAPSULE    Take 400 mg by mouth 2 (two) times daily.   LOSARTAN (COZAAR) 100 MG TABLET    Take 100 mg by mouth daily.   MELATONIN 10 MG TABS    Take 10 mg by mouth at bedtime. Per pt taking 1/2 tablet of 10 mg.   MELOXICAM (MOBIC) 7.5 MG TABLET    Take 7.5 mg by mouth 2 (two) times daily.   MEMANTINE (NAMENDA) 10 MG TABLET    Take 1 tablet (10 mg total) by mouth  2 (two) times daily.   OMEPRAZOLE (PRILOSEC) 20 MG CAPSULE    Take 2 capsules (40 mg total) by mouth daily.   OXYCODONE HCL 10 MG TABS    Take 1 tablet (10 mg total) by mouth every 4 (four) hours as needed (moderate to severe pain).   POLYETHYLENE GLYCOL (MIRALAX / GLYCOLAX) PACKET    Take 17 g by mouth daily.   PRAVASTATIN (PRAVACHOL) 10 MG TABLET    Take 1 tablet (10 mg total) by mouth every evening.   TRAZODONE (DESYREL) 100 MG TABLET    Take 1 tablet (100 mg total) by mouth at bedtime.   VENLAFAXINE XR (EFFEXOR-XR) 150 MG 24 HR CAPSULE    Take 1 capsule (150 mg total) by mouth 2 (two) times daily.  Modified Medications   No medications on file  Discontinued Medications   No medications on file    Subjective: Cody Velez is seen on a workin basis.  He had MSSA bacteremia in March 8119 complicated by thoracic discitis.  He had a very slow recovery finally  completing antibiotic therapy in June 2020.  Earlier this year he developed a spinal cord stimulator pocket infection with MSSA.  The stimulator was removed and his infection resolved with antibiotic therapy through 04/08/2020.  He says that for the last 3 months he has not felt well.  He says that he just feels "blah".  Over the last month he has had some right-sided abdominal pain.  He has also noted some intermittent dull pain in his upper back.  He has not had any recent injury.  He does not know of anything that causes the back pain to come on or get worse.  He says that it is not as bad as it was in 2019 but he is worried that the infection might be coming back.  He saw his PCP, Dr. Willey Blade, yesterday who ordered an abdominal CT scan for his abdominal pain.  He has not had any fever, chills or sweats. Review of Systems: Review of Systems  Constitutional: Positive for malaise/fatigue. Negative for chills, diaphoresis, fever and weight loss.  Respiratory: Negative for cough and shortness of breath.   Cardiovascular: Negative for chest pain.  Gastrointestinal: Positive for abdominal pain and constipation. Negative for diarrhea, nausea and vomiting.       He says that his appetite is not very good.  Musculoskeletal: Positive for back pain.  Skin: Negative for rash.  Psychiatric/Behavioral: Negative for depression. The patient is not nervous/anxious.     Past Medical History:  Diagnosis Date  . Allergic rhinitis   . Arthritis   . B12 deficiency   . Cervicogenic headache   . Childhood asthma   . Chronic back pain   . Coronary atherosclerosis of native coronary artery    Mild nonobstructive CAD at catheterization January 2015  . Dementia ([place])   . Depression   . Essential hypertension   . Falls   . GERD (gastroesophageal reflux disease)   . Glaucoma   . History of blood transfusion   . History of cerebrovascular disease 07/23/2015  . History of kidney stones   . History of pneumonia  02/2011  . Hyperlipidemia   . OSA (obstructive sleep apnea)    CPAP - Dr. [name] - uses cpap every night  . Pneumonia   . Prostate cancer ([place])   . PTSD (post-traumatic stress disorder)    [place]  . Rectal bleeding   . Wheelchair bound     Social History  Tobacco Use  . Smoking status: Former Smoker    Types: Cigarettes    Quit date: 12/20/1958    Years since quitting: 61.9  . Smokeless tobacco: Current User    Types: Chew  Vaping Use  . Vaping Use: Never used  Substance Use Topics  . Alcohol use: Not Currently    Alcohol/week: 0.0 standard drinks    Comment: couple bottles of wine per month  . Drug use: No    Types: Marijuana    Comment: "last used marijuana ~ 1969"    Family History  Problem Relation Age of Onset  . Emphysema Father   . Heart failure Father   . Lung cancer Father   . CAD Father   . Colon cancer Mother   . Stroke Mother   . Breast cancer Mother   . Stroke Sister   . Heart attack Brother   . Dementia Paternal Uncle   . Emphysema Maternal Grandmother   . Stroke Maternal Grandmother   . Asthma Other        grandson  . Heart disease Paternal Grandfather   . Anesthesia problems Neg Hx   . Hypotension Neg Hx   . Malignant hyperthermia Neg Hx   . Pseudochol deficiency Neg Hx     No Known Allergies  Objective: Vitals:   10/30/20 0951  BP: (!) 164/123  Pulse: (!) 120  Temp: 97.9 F (36.6 C)  TempSrc: Oral   There is no height or weight on file to calculate BMI.  Physical Exam Constitutional:      Comments: He is talkative and in good spirits as usual.  He is seated in his electric wheelchair.  Cardiovascular:     Rate and Rhythm: Normal rate and regular rhythm.     Heart sounds: No murmur heard.   Pulmonary:     Effort: Pulmonary effort is normal.     Breath sounds: Normal breath sounds.  Abdominal:     Palpations: Abdomen is soft. There is no mass.     Tenderness: There is no abdominal tenderness.  Musculoskeletal:      Comments: His extensive surgical scars are healed nicely.  There is no unusual redness swelling or warmth of his upper back.  Skin:    Findings: No rash.  Psychiatric:        Mood and Affect: Mood normal.     Lab Results    Problem List Items Addressed This Visit      High   Discitis of thoracic region    I think it is relatively unlikely that his vertebral infection has relapsed at this point.  I see no indication to start empiric antibiotic therapy.  I will check CBC, BMP, sed rate and C-reactive protein.      Relevant Orders   CBC   Basic metabolic panel   C-reactive protein   Sedimentation rate       Michel Bickers, MD [facility] [place] [phone] pager   [phone] cell 10/30/2020, 10:21 AM

## 2020-10-31 LAB — CBC
HCT: 43.8 % (ref 38.5–50.0)
Hemoglobin: 14.3 g/dL (ref 13.2–17.1)
MCH: 28.7 pg (ref 27.0–33.0)
MCHC: 32.6 g/dL (ref 32.0–36.0)
MCV: 87.8 fL (ref 80.0–100.0)
MPV: 10.6 fL (ref 7.5–12.5)
Platelets: 347 10*3/uL (ref 140–400)
RBC: 4.99 10*6/uL (ref 4.20–5.80)
RDW: 14.4 % (ref 11.0–15.0)
WBC: 7.1 10*3/uL (ref 3.8–10.8)

## 2020-10-31 LAB — BASIC METABOLIC PANEL
BUN: 12 mg/dL (ref 7–25)
CO2: 29 mmol/L (ref 20–32)
Calcium: 9.5 mg/dL (ref 8.6–10.3)
Chloride: 105 mmol/L (ref 98–110)
Creat: 0.92 mg/dL (ref 0.70–1.18)
Glucose, Bld: 112 mg/dL — ABNORMAL HIGH (ref 65–99)
Potassium: 4.1 mmol/L (ref 3.5–5.3)
Sodium: 141 mmol/L (ref 135–146)

## 2020-10-31 LAB — SEDIMENTATION RATE: Sed Rate: 6 mm/h (ref 0–20)

## 2020-10-31 LAB — C-REACTIVE PROTEIN: CRP: 3 mg/L (ref <8.0)

## 2020-11-26 ENCOUNTER — Other Ambulatory Visit: Payer: Self-pay

## 2020-11-26 ENCOUNTER — Ambulatory Visit (HOSPITAL_COMMUNITY)
Admission: RE | Admit: 2020-11-26 | Discharge: 2020-11-26 | Disposition: A | Discharge status: Home / Self Care | Payer: Medicare Other | Source: Ambulatory Visit | Attending: Internal Medicine | Admitting: Internal Medicine

## 2020-11-26 DIAGNOSIS — K5730 Diverticulosis of large intestine without perforation or abscess without bleeding: Secondary | ICD-10-CM | POA: Diagnosis not present

## 2020-11-26 DIAGNOSIS — R1031 Right lower quadrant pain: Secondary | ICD-10-CM | POA: Diagnosis not present

## 2020-11-26 DIAGNOSIS — N200 Calculus of kidney: Secondary | ICD-10-CM | POA: Diagnosis not present

## 2020-11-26 MED ORDER — IOHEXOL 300 MG/ML  SOLN
150.0000 mL | Freq: Once | INTRAMUSCULAR | Status: AC | PRN
  Administered 2020-11-26: 125 mL via INTRAVENOUS

## 2020-11-27 ENCOUNTER — Ambulatory Visit: Payer: No Typology Code available for payment source | Admitting: Internal Medicine

## 2021-02-19 DIAGNOSIS — R609 Edema, unspecified: Secondary | ICD-10-CM | POA: Diagnosis not present

## 2021-02-19 DIAGNOSIS — R251 Tremor, unspecified: Secondary | ICD-10-CM | POA: Diagnosis not present

## 2021-03-20 DIAGNOSIS — I639 Cerebral infarction, unspecified: Secondary | ICD-10-CM

## 2021-03-20 HISTORY — DX: Cerebral infarction, unspecified: I63.9

## 2021-03-23 DIAGNOSIS — M546 Pain in thoracic spine: Secondary | ICD-10-CM | POA: Diagnosis not present

## 2021-03-27 ENCOUNTER — Other Ambulatory Visit (HOSPITAL_COMMUNITY): Payer: Self-pay | Admitting: Neurological Surgery

## 2021-03-27 ENCOUNTER — Other Ambulatory Visit: Payer: Self-pay | Admitting: Neurological Surgery

## 2021-03-27 DIAGNOSIS — M4714 Other spondylosis with myelopathy, thoracic region: Secondary | ICD-10-CM

## 2021-04-08 ENCOUNTER — Emergency Department (HOSPITAL_COMMUNITY): Payer: No Typology Code available for payment source

## 2021-04-08 ENCOUNTER — Other Ambulatory Visit: Payer: Self-pay

## 2021-04-08 ENCOUNTER — Emergency Department (HOSPITAL_COMMUNITY)
Admission: EM | Admit: 2021-04-08 | Discharge: 2021-04-08 | Disposition: A | Discharge status: Home / Self Care | Payer: No Typology Code available for payment source | Attending: Emergency Medicine | Admitting: Emergency Medicine

## 2021-04-08 ENCOUNTER — Encounter (HOSPITAL_COMMUNITY): Payer: Self-pay | Admitting: Emergency Medicine

## 2021-04-08 DIAGNOSIS — I493 Ventricular premature depolarization: Secondary | ICD-10-CM | POA: Diagnosis not present

## 2021-04-08 DIAGNOSIS — F0390 Unspecified dementia without behavioral disturbance: Secondary | ICD-10-CM | POA: Insufficient documentation

## 2021-04-08 DIAGNOSIS — R531 Weakness: Secondary | ICD-10-CM | POA: Diagnosis not present

## 2021-04-08 DIAGNOSIS — I63512 Cerebral infarction due to unspecified occlusion or stenosis of left middle cerebral artery: Secondary | ICD-10-CM | POA: Diagnosis not present

## 2021-04-08 DIAGNOSIS — I10 Essential (primary) hypertension: Secondary | ICD-10-CM | POA: Diagnosis not present

## 2021-04-08 DIAGNOSIS — R4781 Slurred speech: Secondary | ICD-10-CM | POA: Insufficient documentation

## 2021-04-08 DIAGNOSIS — Z79899 Other long term (current) drug therapy: Secondary | ICD-10-CM | POA: Insufficient documentation

## 2021-04-08 DIAGNOSIS — R4701 Aphasia: Secondary | ICD-10-CM | POA: Diagnosis not present

## 2021-04-08 DIAGNOSIS — I639 Cerebral infarction, unspecified: Secondary | ICD-10-CM | POA: Diagnosis not present

## 2021-04-08 DIAGNOSIS — Z8546 Personal history of malignant neoplasm of prostate: Secondary | ICD-10-CM | POA: Diagnosis not present

## 2021-04-08 DIAGNOSIS — Z7982 Long term (current) use of aspirin: Secondary | ICD-10-CM | POA: Diagnosis not present

## 2021-04-08 DIAGNOSIS — I2510 Atherosclerotic heart disease of native coronary artery without angina pectoris: Secondary | ICD-10-CM | POA: Diagnosis not present

## 2021-04-08 DIAGNOSIS — Z20822 Contact with and (suspected) exposure to covid-19: Secondary | ICD-10-CM | POA: Diagnosis not present

## 2021-04-08 DIAGNOSIS — I119 Hypertensive heart disease without heart failure: Secondary | ICD-10-CM | POA: Diagnosis not present

## 2021-04-08 DIAGNOSIS — Z87891 Personal history of nicotine dependence: Secondary | ICD-10-CM | POA: Insufficient documentation

## 2021-04-08 LAB — RAPID URINE DRUG SCREEN, HOSP PERFORMED
Amphetamines: NOT DETECTED
Barbiturates: NOT DETECTED
Benzodiazepines: NOT DETECTED
Cocaine: NOT DETECTED
Opiates: NOT DETECTED
Tetrahydrocannabinol: NOT DETECTED

## 2021-04-08 LAB — PROTIME-INR
INR: 1 (ref 0.8–1.2)
Prothrombin Time: 13.2 seconds (ref 11.4–15.2)

## 2021-04-08 LAB — COMPREHENSIVE METABOLIC PANEL
ALT: 27 U/L (ref 0–44)
AST: 21 U/L (ref 15–41)
Albumin: 3.9 g/dL (ref 3.5–5.0)
Alkaline Phosphatase: 98 U/L (ref 38–126)
Anion gap: 8 (ref 5–15)
BUN: 9 mg/dL (ref 8–23)
CO2: 27 mmol/L (ref 22–32)
Calcium: 9.2 mg/dL (ref 8.9–10.3)
Chloride: 103 mmol/L (ref 98–111)
Creatinine, Ser: 0.95 mg/dL (ref 0.61–1.24)
GFR, Estimated: >60 mL/min (ref >60)
Glucose, Bld: 95 mg/dL (ref 70–99)
Potassium: 4 mmol/L (ref 3.5–5.1)
Sodium: 138 mmol/L (ref 135–145)
Total Bilirubin: 0.5 mg/dL (ref 0.3–1.2)
Total Protein: 7.5 g/dL (ref 6.5–8.1)

## 2021-04-08 LAB — RESP PANEL BY RT-PCR (FLU A&B, COVID) ARPGX2
Influenza A by PCR: NEGATIVE
Influenza B by PCR: NEGATIVE
SARS Coronavirus 2 by RT PCR: NEGATIVE

## 2021-04-08 LAB — CBC
HCT: 47.9 % (ref 39.0–52.0)
Hemoglobin: 14.7 g/dL (ref 13.0–17.0)
MCH: 28.5 pg (ref 26.0–34.0)
MCHC: 30.7 g/dL (ref 30.0–36.0)
MCV: 92.8 fL (ref 80.0–100.0)
Platelets: 345 10*3/uL (ref 150–400)
RBC: 5.16 MIL/uL (ref 4.22–5.81)
RDW: 15 % (ref 11.5–15.5)
WBC: 8.2 10*3/uL (ref 4.0–10.5)
nRBC: 0 % (ref 0.0–0.2)

## 2021-04-08 LAB — URINALYSIS, ROUTINE W REFLEX MICROSCOPIC
Bilirubin Urine: NEGATIVE
Glucose, UA: NEGATIVE mg/dL
Hgb urine dipstick: NEGATIVE
Ketones, ur: NEGATIVE mg/dL
Leukocytes,Ua: NEGATIVE
Nitrite: NEGATIVE
Protein, ur: NEGATIVE mg/dL
Specific Gravity, Urine: >1.03 — ABNORMAL HIGH (ref 1.005–1.030)
pH: 6.5 (ref 5.0–8.0)

## 2021-04-08 LAB — ETHANOL: Alcohol, Ethyl (B): <10 mg/dL (ref <10)

## 2021-04-08 LAB — APTT: aPTT: 28 seconds (ref 24–36)

## 2021-04-08 LAB — DIFFERENTIAL
Abs Immature Granulocytes: 0.03 10*3/uL (ref 0.00–0.07)
Basophils Absolute: 0.1 10*3/uL (ref 0.0–0.1)
Basophils Relative: 1 %
Eosinophils Absolute: 0.4 10*3/uL (ref 0.0–0.5)
Eosinophils Relative: 5 %
Immature Granulocytes: 0 %
Lymphocytes Relative: 15 %
Lymphs Abs: 1.2 10*3/uL (ref 0.7–4.0)
Monocytes Absolute: 0.8 10*3/uL (ref 0.1–1.0)
Monocytes Relative: 10 %
Neutro Abs: 5.7 10*3/uL (ref 1.7–7.7)
Neutrophils Relative %: 69 %

## 2021-04-08 MED ORDER — CLOPIDOGREL BISULFATE 75 MG PO TABS: 75.0000 mg | ORAL_TABLET | Freq: Every day | ORAL | 2 refills | Status: AC

## 2021-04-08 NOTE — ED Triage Notes (Signed)
Send by PCP this am.  Pt reports slurred speech since Saturday morning upon waking.  LKW was Friday night about 2200.  C/o weakness right arm and right leg since Friday.  Denies any pain.

## 2021-04-08 NOTE — ED Notes (Signed)
Total linen change with pericare.

## 2021-04-08 NOTE — ED Notes (Signed)
Patient transported to MRI 

## 2021-04-08 NOTE — Discharge Instructions (Addendum)
Neurology is recommending starting an aspirin a day and Plavix.  Follow-up with Dr. Merlene Laughter from neurology  Your MRI shows that you have had a stroke, the neurologist wants to see you within the next 2 or 3 weeks, call the office for a follow-up.  Take a full aspirin and take clopidogrel as prescribed, 1 tablet a day of each of these medications  Return to the emergency department for severe or worsening symptom

## 2021-04-08 NOTE — ED Notes (Signed)
MRI contacted for transport

## 2021-04-08 NOTE — ED Provider Notes (Signed)
I discussed the results of the patient's MRI with Dr. Merlene Laughter of the neurology service.  He recommends that the patient can be discharged to follow-up with him in the office and to be placed on clopidogrel and aspirin.  The patient is agreeable to this plan, he is aware of the indications for return, follow-up in the office has been secured, Dr. Merlene Laughter is willing and recommends outpatient follow-up rather than inpatient work-up. Stable for discharge at this time   Noemi Chapel, MD 04/08/21 [phone]

## 2021-04-08 NOTE — ED Provider Notes (Addendum)
[facility] Provider Note   CSN: [id number] Arrival date & time: 04/08/21  1146     History Chief Complaint  Patient presents with  . Aphasia    LKW Friday night 2200.      Cody Velez is a 75 y.o. male.  Patient states that he awoke on Saturday morning.  Last known normal within Friday night at 2200.  And he had weakness to his right arm right leg and had slurred speech.  The speech has improved but the weakness has persisted.  Patient is wheelchair-bound he normally has some weakness in both lower extremities secondary to a spinal cord infection according to patient.  Chart review shows that he may have had a CVA in 2016.  But he denied any prior strokes.  Patient also has a history of dementia but seems quite alert here and following commands fine.  Patient states he was out of town over the weekend.  And did not don on him that he may have had a stroke.        Past Medical History:  Diagnosis Date  . Allergic rhinitis   . Arthritis   . B12 deficiency   . Cervicogenic headache   . Childhood asthma   . Chronic back pain   . Coronary atherosclerosis of native coronary artery    Mild nonobstructive CAD at catheterization January 2015  . Dementia ([place])   . Depression   . Essential hypertension   . Falls   . GERD (gastroesophageal reflux disease)   . Glaucoma   . History of blood transfusion   . History of cerebrovascular disease 07/23/2015  . History of kidney stones   . History of pneumonia 02/2011  . Hyperlipidemia   . OSA (obstructive sleep apnea)    CPAP - Dr. [name] - uses cpap every night  . Pneumonia   . Prostate cancer ([place])   . PTSD (post-traumatic stress disorder)    [place]  . Rectal bleeding   . Wheelchair bound     Patient Active Problem List   Diagnosis Date Noted  . Infection of spinal cord stimulator ([place]) 02/26/2020  . Pseudogout of knee   . Pyogenic arthritis of right knee joint ([place])   . Falls frequently 05/08/2018   . Fall   . Sleep disturbance   . Post-op pain   . Hypokalemia   . Morbid obesity ([place])   . Postoperative pain   . DVT, lower extremity, distal, acute, bilateral ([place])   . Acute blood loss anemia   . Anemia of chronic disease   . Essential hypertension   . Bacteremia   . Myelopathy ([place]) 03/30/2018  . Paraplegia ([place])   . Postlaminectomy syndrome, lumbar region   . Epidural abscess   . Abdominal distension   . Encephalopathy   . Weakness of both lower extremities   . Discitis of thoracic region   . Spinal cord stimulator status   . Staphylococcus aureus sepsis ([place]) 03/20/2018  . Bacteremia due to methicillin susceptible Staphylococcus aureus (MSSA) 03/18/2018  . Obesity 03/18/2018  . Nephrolithiasis 03/16/2018  . AKI (acute kidney injury) ([place]) 03/16/2018  . Cognitive impairment 03/16/2018  . Chronic pain syndrome 03/16/2018  . Cervicogenic headache 01/28/2016  . B12 deficiency 01/28/2016  . Memory difficulty 07/23/2015  . History of cerebrovascular disease 07/23/2015  . FH: colon cancer 08/13/2014  . Hx of adenomatous colonic polyps 08/13/2014  . Chronic back pain 05/30/2012  . CAD, NATIVE VESSEL 07/17/2010  .  HYPERCHOLESTEROLEMIA 10/31/2009  . SMOKELESS TOBACCO ABUSE 10/31/2009  . Obstructive sleep apnea 10/31/2009  . Essential hypertension, benign 10/31/2009  . GERD 10/31/2009    Past Surgical History:  Procedure Laterality Date  . APPLICATION OF ROBOTIC ASSISTANCE FOR SPINAL PROCEDURE N/A 03/28/2018   Procedure: APPLICATION OF ROBOTIC ASSISTANCE FOR SPINAL PROCEDURE;  Surgeon: Kristeen Miss, MD;  Location: [facility];  Service: Neurosurgery;  Laterality: N/A;  . BACK SURGERY  02/14/12   lumbar OR #7; "today redid L1L2; replaced screws; added bone from hip"  . BILATERAL KNEE ARTHROSCOPY    . COLONOSCOPY  10/15/2008   Dr. [name]: tubular adenoma   . COLONOSCOPY  12/17/2003   KGM:WNUUVO rectal and colon  . COLONOSCOPY N/A 09/05/2014   Procedure: COLONOSCOPY;  Surgeon:  Daneil Dolin, MD;  [facility];  Service: Endoscopy;  Laterality: N/A;  7:30-rescheduled 9/17 to [place] notified pt  . CYSTOSCOPY WITH STENT PLACEMENT Right 01/27/2016   Procedure: CYSTOSCOPY WITH STENT PLACEMENT;  Surgeon: [name], MD;  Location: [facility];  Service: Urology;  Laterality: Right;  . CYSTOSCOPY/RETROGRADE/URETEROSCOPY/STONE EXTRACTION WITH BASKET Right 01/27/2016   Procedure: CYSTOSCOPY, RIGHT RETROGRADE, RIGHT URETEROSCOPY, STONE EXTRACTION ;  Surgeon: [name], MD;  Location: [facility];  Service: Urology;  Laterality: Right;  . ESOPHAGOGASTRODUODENOSCOPY  10/15/2008     Dr Rourk:Schatzki's ring status post dilation and disruption via 8 F Maloney dilator/ otherwise unremarkable esophagus, small hiatal hernia, multiple fundal gland polyps not manipulated, gastritis, negative H.pylori  . ESOPHAGOGASTRODUODENOSCOPY  06/21/02   ZDG:UYQIH sliding hiatal hernia with mild changes of reflux esophagitis limited to gastroesophageal junction.  Noncritical ring at distal esophagus, 3 cm proximal to gastroesophageal junction/Antral gastritis  . [place]   "broke face playing softball"  . FRACTURE SURGERY     "left wrist; broke it; took spur off"  . HOLMIUM LASER APPLICATION Right 03/26/4258   Procedure: HOLMIUM LASER APPLICATION;  Surgeon: [name], MD;  Location: [facility];  Service: Urology;  Laterality: Right;  . KNEE ARTHROSCOPY Right 05/18/2018   Procedure: PARTIAL MEDIAL MENISECTOMY AND SURGICAL LAVAGE AND CHONDROPLASTY;  Surgeon: [name], MD;  Location: [facility];  Service: Orthopedics;  Laterality: Right;  . LEFT HEART CATHETERIZATION WITH CORONARY ANGIOGRAM N/A 12/27/2013   Procedure: LEFT HEART CATHETERIZATION WITH CORONARY ANGIOGRAM;  Surgeon: [name], MD;  Location: [facility] CATH LAB;  Service: Cardiovascular;  Laterality: N/A;  . neck epidural    . POSTERIOR LUMBAR FUSION 4 LEVEL N/A 03/28/2018   Procedure: Thoracic eight  -Lumbar two- FIXATION WITH SCREW PLACEMENT, DECOMPRESSION Thoracic ten-Thoracic eleven  FOR OSTEOMYELITIS;  Surgeon: Kristeen Miss, MD;  Location: [facility];  Service: Neurosurgery;  Laterality: N/A;  . SHOULDER SURGERY Bilateral   . SPINAL CORD STIMULATOR REMOVAL N/A 02/25/2020   Procedure: LUMBAR SPINAL CORD STIMULATOR REMOVAL;  Surgeon: Kristeen Miss, MD;  Location: [facility];  Service: Neurosurgery;  Laterality: N/A;  . TEE WITHOUT CARDIOVERSION N/A 03/21/2018   Procedure: TRANSESOPHAGEAL ECHOCARDIOGRAM (TEE) WITH PROPOFOL;  Surgeon: [name], MD;  [facility];  Service: Cardiovascular;  Laterality: N/A;       Family History  Problem Relation Age of Onset  . Emphysema Father   . Heart failure Father   . Lung cancer Father   . CAD Father   . Colon cancer Mother   . Stroke Mother   . Breast cancer Mother   . Stroke Sister   . Heart attack Brother   .  Dementia Paternal Uncle   . Emphysema Maternal Grandmother   . Stroke Maternal Grandmother   . Asthma Other        grandson  . Heart disease Paternal Grandfather   . Anesthesia problems Neg Hx   . Hypotension Neg Hx   . Malignant hyperthermia Neg Hx   . Pseudochol deficiency Neg Hx     Social History   Tobacco Use  . Smoking status: Former Smoker    Types: Cigarettes    Quit date: 12/20/1958    Years since quitting: 62.3  . Smokeless tobacco: Current User    Types: Chew  Vaping Use  . Vaping Use: Never used  Substance Use Topics  . Alcohol use: Not Currently    Alcohol/week: 0.0 standard drinks    Comment: couple bottles of wine per month  . Drug use: No    Types: Marijuana    Comment: "last used marijuana ~ 1969"    Home Medications Prior to Admission medications   Medication Sig Start Date End Date Taking? Authorizing Provider  alfuzosin (UROXATRAL) 10 MG 24 hr tablet Take 10 mg by mouth daily with breakfast.    [provider]  amLODipine (NORVASC) 5 MG tablet Take 5 mg by mouth daily.     [provider]  aspirin 81 MG tablet Take 1 tablet (81 mg total) by mouth daily. 05/22/18   Bonnielee Haff, MD  baclofen (LIORESAL) 10 MG tablet TAKE ONE TABLET IN THE MORNING AND MIDDAY, THEN TWO TABLETS AT NIGHT 06/12/18   Kathrynn Ducking, MD  cycloSPORINE (RESTASIS) 0.05 % ophthalmic emulsion 1 DROP INTO BOTH EYES TWICE A DAY FOR DRY EYES 05/22/18   Bonnielee Haff, MD  docusate sodium (COLACE) 100 MG capsule Take 2 capsules (200 mg total) by mouth 2 (two) times daily. 05/22/18   Bonnielee Haff, MD  donepezil (ARICEPT) 10 MG tablet Take 1 tablet (10 mg total) by mouth at bedtime. 05/22/18   Bonnielee Haff, MD  finasteride (PROSCAR) 5 MG tablet Take 1 tablet (5 mg total) by mouth daily. Reported on 05/04/2016 05/22/18   Bonnielee Haff, MD  fluticasone [facility]) 50 MCG/ACT nasal spray Place 1 spray into both nostrils daily as needed for allergies. 05/22/18   Bonnielee Haff, MD  gabapentin (NEURONTIN) 400 MG capsule Take 400 mg by mouth 2 (two) times daily.    [provider]  losartan (COZAAR) 100 MG tablet Take 100 mg by mouth daily.    [provider]  Melatonin 10 MG TABS Take 10 mg by mouth at bedtime. Per pt taking 1/2 tablet of 10 mg.    [provider]  meloxicam (MOBIC) 7.5 MG tablet Take 7.5 mg by mouth 2 (two) times daily. 08/12/20   [provider]  memantine (NAMENDA) 10 MG tablet Take 1 tablet (10 mg total) by mouth 2 (two) times daily. 05/22/18   Bonnielee Haff, MD  omeprazole (PRILOSEC) 20 MG capsule Take 2 capsules (40 mg total) by mouth daily. 05/22/18   Bonnielee Haff, MD  Oxycodone HCl 10 MG TABS Take 1 tablet (10 mg total) by mouth every 4 (four) hours as needed (moderate to severe pain). 05/22/18   Bonnielee Haff, MD  polyethylene glycol [facility] Floria Raveling) packet Take 17 g by mouth daily. 05/23/18   Bonnielee Haff, MD  pravastatin (PRAVACHOL) 10 MG tablet Take 1 tablet (10 mg total) by mouth every evening. 05/22/18   Bonnielee Haff, MD   traZODone (DESYREL) 100 MG tablet Take 1 tablet (100  mg total) by mouth at bedtime. 05/22/18   Bonnielee Haff, MD  venlafaxine XR (EFFEXOR-XR) 150 MG 24 hr capsule Take 1 capsule (150 mg total) by mouth 2 (two) times daily. 05/22/18   Bonnielee Haff, MD    Allergies    Patient has no known allergies.  Review of Systems   Review of Systems  Constitutional: Negative for chills and fever.  HENT: Negative for rhinorrhea and sore throat.   Eyes: Negative for visual disturbance.  Respiratory: Negative for cough and shortness of breath.   Cardiovascular: Negative for chest pain and leg swelling.  Gastrointestinal: Negative for abdominal pain, diarrhea, nausea and vomiting.  Genitourinary: Negative for dysuria.  Musculoskeletal: Negative for back pain and neck pain.  Skin: Negative for rash.  Neurological: Positive for speech difficulty and weakness. Negative for dizziness, light-headedness and headaches.  Hematological: Does not bruise/bleed easily.  Psychiatric/Behavioral: Negative for confusion.    Physical Exam Updated Vital Signs BP (!) 175/99   Pulse 69   Temp 97.9 F (36.6 C)   Resp 13   Ht 1.93 m (6\' 4" )   Wt 136.1 kg   SpO2 99%   BMI 36.52 kg/m   Physical Exam Vitals and nursing note reviewed.  Constitutional:      Appearance: Normal appearance. He is well-developed.  HENT:     Head: Normocephalic and atraumatic.  Eyes:     Conjunctiva/sclera: Conjunctivae normal.     Pupils: Pupils are equal, round, and reactive to light.  Cardiovascular:     Rate and Rhythm: Normal rate and regular rhythm.     Heart sounds: No murmur heard.   Pulmonary:     Effort: Pulmonary effort is normal. No respiratory distress.     Breath sounds: Normal breath sounds.  Abdominal:     Palpations: Abdomen is soft.     Tenderness: There is no abdominal tenderness.  Musculoskeletal:        General: Swelling present.     Cervical back: Normal range of motion and neck supple.  Skin:     General: Skin is warm and dry.     Capillary Refill: Capillary refill takes less than 2 seconds.  Neurological:     Mental Status: He is alert.     Cranial Nerves: Cranial nerve deficit present.     Sensory: Sensory deficit present.     Motor: Weakness present.     Comments: Patient with 4 out of 5 right upper extremity weakness.  There is some drift.  Significant weakness to the right lower extremity.  Cannot pick it up off the bed.  Able to pick his left lower extremity.  Patient has baseline sensory deficit to his lower extremities.  But he does describe numbness around his mouth area.     ED Results / Procedures / Treatments   Labs (all labs ordered are listed, but only abnormal results are displayed) Labs Reviewed  RESP PANEL BY RT-PCR (FLU A&B, COVID) ARPGX2  ETHANOL  PROTIME-INR  APTT  CBC  DIFFERENTIAL  COMPREHENSIVE METABOLIC PANEL  RAPID URINE DRUG SCREEN, HOSP PERFORMED  URINALYSIS, ROUTINE W REFLEX MICROSCOPIC    EKG EKG Interpretation  Date/Time:  Wednesday April 08 2021 13:02:07 EDT Ventricular Rate:  69 PR Interval:  183 QRS Duration: 109 QT Interval:  443 QTC Calculation: 475 R Axis:   -48 Text Interpretation: Sinus rhythm Supraventricular bigeminy LAD, consider left anterior fascicular block Confirmed by Fredia Sorrow [phone]) on 04/08/2021 1:07:18 PM   Radiology CT HEAD WO  CONTRAST  Result Date: 04/08/2021 CLINICAL DATA:  Right arm and leg weakness with slurred speech EXAM: CT HEAD WITHOUT CONTRAST TECHNIQUE: Contiguous axial images were obtained from the base of the skull through the vertex without intravenous contrast. COMPARISON:  March 19, 2018 FINDINGS: Brain: No evidence of acute large vascular territory infarction, hemorrhage, hydrocephalus, extra-axial collection or mass lesion/mass effect. Slightly increased extensive chronic ischemic small vessel white matter disease. Lacunar type infarct in the right basal ganglia and left pons. Vascular: No  hyperdense vessel. Atherosclerotic calcifications of the intracranial portions of the internal carotid arteries. Skull: Normal. Negative for fracture or focal lesion. Sinuses/Orbits: Scattered mucosal thickening of the ethmoid sinuses otherwise the paranasal sinuses and mastoid air cells are predominantly clear. Orbits are grossly unremarkable. Other: Dental hardware IMPRESSION: 1. No CT evidence of acute intracranial abnormality. 2. Sign increased extensive chronic ischemic small vessel white matter disease. Chronic lacunar type infarcts in the right basal ganglia and left pons. Electronically Signed   By: Dahlia Bailiff MD   On: 04/08/2021 13:28    Procedures Procedures   CRITICAL CARE Performed by: Fredia Sorrow Total critical care time: 45 minutes Critical care time was exclusive of separately billable procedures and treating other patients. Critical care was necessary to treat or prevent imminent or life-threatening deterioration. Critical care was time spent personally by me on the following activities: development of treatment plan with patient and/or surrogate as well as nursing, discussions with consultants, evaluation of patient's response to treatment, examination of patient, obtaining history from patient or surrogate, ordering and performing treatments and interventions, ordering and review of laboratory studies, ordering and review of radiographic studies, pulse oximetry and re-evaluation of patient's condition.   Medications Ordered in ED Medications - No data to display  ED Course  I have reviewed the triage vital signs and the nursing notes.  Pertinent labs & imaging results that were available during my care of the patient were reviewed by me and considered in my medical decision making (see chart for details).    MDM Rules/Calculators/A&P                         Patient last normal Friday evening.  Woke Saturday morning apparently with significant slurred speech.  And  right-sided weakness.  Patient has some chronic bilateral lower extremity weakness but he could not move his right leg hardly at all.  And his right upper extremity was very weak.  Patient states speech is better now but still has the right-sided weakness.  CT head showed no acute infarct but does show evidence of several chronic small infarcts.  MRI ordered.  But feel patient will require admission and additional work-up.  Patient did not know that he had history of strokes in the past.  However past medical history shows that there was CVA in 2016 With the finding of the chronic small infarcts.  CT angio may be needed.   Will discuss with Dr. Merlene Laughter on-call for neurology.  Final Clinical Impression(s) / ED Diagnoses Final diagnoses:  Aphasia  Right sided weakness    Rx / DC Orders ED Discharge Orders    None       Fredia Sorrow, MD 04/08/21 1418   Discussed with Dr. Merlene Laughter from neurology.  He says it is just small vessel since several days out that he can be discharged home on aspirin and Plavix.  Follow-up with him in the office.  MRI is showing abnormalities and perfusion.  So MRA is being done.  Final disposition be by Dr. Noemi Chapel.   Fredia Sorrow, MD 04/08/21 [phone]

## 2021-04-16 ENCOUNTER — Ambulatory Visit (HOSPITAL_COMMUNITY)
Admission: RE | Admit: 2021-04-16 | Discharge: 2021-04-16 | Disposition: A | Discharge status: Home / Self Care | Payer: Medicare Other | Source: Ambulatory Visit | Attending: Neurological Surgery | Admitting: Neurological Surgery

## 2021-04-16 DIAGNOSIS — N281 Cyst of kidney, acquired: Secondary | ICD-10-CM | POA: Diagnosis not present

## 2021-04-16 DIAGNOSIS — M4714 Other spondylosis with myelopathy, thoracic region: Secondary | ICD-10-CM | POA: Insufficient documentation

## 2021-04-16 DIAGNOSIS — M5104 Intervertebral disc disorders with myelopathy, thoracic region: Secondary | ICD-10-CM | POA: Diagnosis not present

## 2021-04-16 DIAGNOSIS — Z8669 Personal history of other diseases of the nervous system and sense organs: Secondary | ICD-10-CM | POA: Diagnosis not present

## 2021-04-20 DIAGNOSIS — Z8673 Personal history of transient ischemic attack (TIA), and cerebral infarction without residual deficits: Secondary | ICD-10-CM | POA: Diagnosis not present

## 2021-04-22 DIAGNOSIS — I10 Essential (primary) hypertension: Secondary | ICD-10-CM | POA: Diagnosis not present

## 2021-04-22 DIAGNOSIS — Z6837 Body mass index (BMI) 37.0-37.9, adult: Secondary | ICD-10-CM | POA: Diagnosis not present

## 2021-04-22 DIAGNOSIS — M4714 Other spondylosis with myelopathy, thoracic region: Secondary | ICD-10-CM | POA: Diagnosis not present

## 2021-04-29 ENCOUNTER — Encounter: Payer: Self-pay | Admitting: Neurology

## 2021-04-29 ENCOUNTER — Ambulatory Visit (INDEPENDENT_AMBULATORY_CARE_PROVIDER_SITE_OTHER): Payer: Medicare Other | Admitting: Neurology

## 2021-04-29 VITALS — BP 153/82 | HR 76 | Ht 75.0 in | Wt 312.6 lb

## 2021-04-29 DIAGNOSIS — G9340 Encephalopathy, unspecified: Secondary | ICD-10-CM | POA: Diagnosis not present

## 2021-04-29 DIAGNOSIS — G959 Disease of spinal cord, unspecified: Secondary | ICD-10-CM

## 2021-04-29 DIAGNOSIS — I639 Cerebral infarction, unspecified: Secondary | ICD-10-CM | POA: Insufficient documentation

## 2021-04-29 DIAGNOSIS — I63212 Cerebral infarction due to unspecified occlusion or stenosis of left vertebral arteries: Secondary | ICD-10-CM

## 2021-04-29 DIAGNOSIS — R413 Other amnesia: Secondary | ICD-10-CM

## 2021-04-29 NOTE — Progress Notes (Signed)
Reason for visit: Minimum cognitive impairment, recent stroke  Referring physician: Dr. Keane Scrape is a 75 y.o. male  History of present illness:  Mr. Whaling is a 75 year old left-handed white male with a history of a thoracic myelopathy that occurred following an infectious complication of a spinal stimulator.  The patient had a delirium state with the infection, and he has had some permanent cognitive changes since that time, he has had some mild memory issues ever since.  This has been relatively stable, he is on Aricept and Namenda.  The patient comes to the office today for an evaluation of a stroke event that occurred on 06 April 2021.  The patient woke up with slurred speech and right hemiparesis.  He did not recognize that this may be a stroke until 2 days later.  The patient was seen through [facility] and MRI of the brain was done showing evidence of a left pontine stroke.  MRA showed occlusion of the left vertebral artery with retrograde flow from the basilar.  The right vertebral artery appears to be large and likely is a dominant vessel.  The patient never underwent a cholesterol panel or a 2D echocardiogram.  A 2D echocardiogram has been set up through the [facility] hospital.  The patient was placed on aspirin and Plavix.  He has gained some strength back in the right arm and his speech has returned to normal but his right leg remains quite weak.  He can stand for transfers but cannot ambulate with a walker.  The patient claims that prior to the stroke he was walking 1500 feet with a walker.  The patient has not had any falls.  He denies issues with double vision or vertigo or difficulty swallowing.  He has not had any visual loss.  He has been set up for physical therapy that will be started soon.  He comes here for further evaluation.  Past Medical History:  Diagnosis Date  . Allergic rhinitis   . Arthritis   . B12 deficiency   . Cervicogenic headache   .  Childhood asthma   . Chronic back pain   . Coronary atherosclerosis of native coronary artery    Mild nonobstructive CAD at catheterization January 2015  . Dementia ([place])   . Depression   . Essential hypertension   . Falls   . GERD (gastroesophageal reflux disease)   . Glaucoma   . History of blood transfusion   . History of cerebrovascular disease 07/23/2015  . History of kidney stones   . History of pneumonia 02/2011  . Hyperlipidemia   . OSA (obstructive sleep apnea)    CPAP - Dr. [name] - uses cpap every night  . Pneumonia   . Prostate cancer ([place])   . PTSD (post-traumatic stress disorder)    [place]  . Rectal bleeding   . Wheelchair bound     Past Surgical History:  Procedure Laterality Date  . APPLICATION OF ROBOTIC ASSISTANCE FOR SPINAL PROCEDURE N/A 03/28/2018   Procedure: APPLICATION OF ROBOTIC ASSISTANCE FOR SPINAL PROCEDURE;  Surgeon: Kristeen Miss, MD;  Location: [place];  Service: Neurosurgery;  Laterality: N/A;  . BACK SURGERY  02/14/12   lumbar OR #7; "today redid L1L2; replaced screws; added bone from hip"  . BILATERAL KNEE ARTHROSCOPY    . COLONOSCOPY  10/15/2008   Dr. [name]: tubular adenoma   . COLONOSCOPY  12/17/2003   NTI:[id number] rectal and colon  . COLONOSCOPY N/A 09/05/2014  Procedure: COLONOSCOPY;  Surgeon: Daneil Dolin, MD;  [facility];  Service: Endoscopy;  Laterality: N/A;  7:30-rescheduled 9/17 to [place] notified pt  . CYSTOSCOPY WITH STENT PLACEMENT Right 01/27/2016   Procedure: CYSTOSCOPY WITH STENT PLACEMENT;  Surgeon: [name], MD;  [facility];  Service: Urology;  Laterality: Right;  . CYSTOSCOPY/RETROGRADE/URETEROSCOPY/STONE EXTRACTION WITH BASKET Right 01/27/2016   Procedure: CYSTOSCOPY, RIGHT RETROGRADE, RIGHT URETEROSCOPY, STONE EXTRACTION ;  Surgeon: [name], MD;  [facility]: [facility];  Service: Urology;  Laterality: Right;  . ESOPHAGOGASTRODUODENOSCOPY  10/15/2008     Dr Rourk:Schatzki's ring status post  dilation and disruption via 74 F [address] dilator/ otherwise unremarkable esophagus, small hiatal hernia, multiple fundal gland polyps not manipulated, gastritis, negative H.pylori  . ESOPHAGOGASTRODUODENOSCOPY  06/21/02   [facility]:LGXQJ sliding hiatal hernia with mild changes of reflux esophagitis limited to gastroesophageal junction.  Noncritical ring at distal esophagus, 3 cm proximal to gastroesophageal junction/Antral gastritis  . [place]   "broke face playing softball"  . FRACTURE SURGERY     "left wrist; broke it; took spur off"  . HOLMIUM LASER APPLICATION Right 12/28/4172   Procedure: HOLMIUM LASER APPLICATION;  Surgeon: [name], MD;  [facility];  Service: Urology;  Laterality: Right;  . KNEE ARTHROSCOPY Right 05/18/2018   Procedure: PARTIAL MEDIAL MENISECTOMY AND SURGICAL LAVAGE AND CHONDROPLASTY;  Surgeon: [name], MD;  Location: [place];  Service: Orthopedics;  Laterality: Right;  . LEFT HEART CATHETERIZATION WITH CORONARY ANGIOGRAM N/A 12/27/2013   Procedure: LEFT HEART CATHETERIZATION WITH CORONARY ANGIOGRAM;  Surgeon: [name], MD;  Location: [facility] CATH LAB;  Service: Cardiovascular;  Laterality: N/A;  . neck epidural    . POSTERIOR LUMBAR FUSION 4 LEVEL N/A 03/28/2018   Procedure: Thoracic eight -Lumbar two- FIXATION WITH SCREW PLACEMENT, DECOMPRESSION Thoracic ten-Thoracic eleven  FOR OSTEOMYELITIS;  Surgeon: Kristeen Miss, MD;  Location: [place];  Service: Neurosurgery;  Laterality: N/A;  . SHOULDER SURGERY Bilateral   . SPINAL CORD STIMULATOR REMOVAL N/A 02/25/2020   Procedure: LUMBAR SPINAL CORD STIMULATOR REMOVAL;  Surgeon: Kristeen Miss, MD;  Location: [place];  Service: Neurosurgery;  Laterality: N/A;  . TEE WITHOUT CARDIOVERSION N/A 03/21/2018   Procedure: TRANSESOPHAGEAL ECHOCARDIOGRAM (TEE) WITH PROPOFOL;  Surgeon: [name], MD;  [facility];  Service: Cardiovascular;  Laterality: N/A;    Family History  Problem  Relation Age of Onset  . Emphysema Father   . Heart failure Father   . Lung cancer Father   . CAD Father   . Colon cancer Mother   . Stroke Mother   . Breast cancer Mother   . Stroke Sister   . Heart attack Brother   . Dementia Paternal Uncle   . Emphysema Maternal Grandmother   . Stroke Maternal Grandmother   . Asthma Other        grandson  . Heart disease Paternal Grandfather   . Anesthesia problems Neg Hx   . Hypotension Neg Hx   . Malignant hyperthermia Neg Hx   . Pseudochol deficiency Neg Hx     Social history:  reports that he quit smoking about 62 years ago. His smoking use included cigarettes. His smokeless tobacco use includes chew. He reports previous alcohol use. He reports that he does not use drugs.  Medications:  Prior to Admission medications   Medication Sig Start Date End Date Taking? Authorizing Provider  alfuzosin (UROXATRAL) 10 MG 24 hr tablet Take 10 mg  by mouth daily with breakfast.   Yes [provider]  amLODipine (NORVASC) 5 MG tablet Take 5 mg by mouth daily.   Yes [provider]  aspirin 81 MG tablet Take 1 tablet (81 mg total) by mouth daily. 05/22/18  Yes Bonnielee Haff, MD  baclofen (LIORESAL) 10 MG tablet TAKE ONE TABLET IN THE MORNING AND MIDDAY, THEN TWO TABLETS AT NIGHT 06/12/18  Yes Kathrynn Ducking, MD  clopidogrel (PLAVIX) 75 MG tablet Take 1 tablet (75 mg total) by mouth daily. 04/08/21  Yes Fredia Sorrow, MD  cycloSPORINE (RESTASIS) 0.05 % ophthalmic emulsion 1 DROP INTO BOTH EYES TWICE A DAY FOR DRY EYES 05/22/18  Yes Bonnielee Haff, MD  docusate sodium (COLACE) 100 MG capsule Take 2 capsules (200 mg total) by mouth 2 (two) times daily. 05/22/18  Yes Bonnielee Haff, MD  donepezil (ARICEPT) 10 MG tablet Take 1 tablet (10 mg total) by mouth at bedtime. 05/22/18  Yes Bonnielee Haff, MD  finasteride (PROSCAR) 5 MG tablet Take 1 tablet (5 mg total) by mouth daily. Reported on 05/04/2016 05/22/18  Yes Bonnielee Haff, MD  fluticasone  [facility]) 50 MCG/ACT nasal spray Place 1 spray into both nostrils daily as needed for allergies. 05/22/18  Yes Bonnielee Haff, MD  gabapentin (NEURONTIN) 400 MG capsule Take 400 mg by mouth 2 (two) times daily.   Yes [provider]  losartan (COZAAR) 100 MG tablet Take 100 mg by mouth daily.   Yes [provider]  Melatonin 10 MG TABS Take 10 mg by mouth at bedtime. Per pt taking 1/2 tablet of 10 mg.   Yes [provider]  meloxicam (MOBIC) 7.5 MG tablet Take 7.5 mg by mouth 2 (two) times daily. 08/12/20  Yes [provider]  memantine (NAMENDA) 10 MG tablet Take 1 tablet (10 mg total) by mouth 2 (two) times daily. 05/22/18  Yes Bonnielee Haff, MD  omeprazole (PRILOSEC) 20 MG capsule Take 2 capsules (40 mg total) by mouth daily. 05/22/18  Yes Bonnielee Haff, MD  Oxycodone HCl 10 MG TABS Take 1 tablet (10 mg total) by mouth every 4 (four) hours as needed (moderate to severe pain). 05/22/18  Yes Bonnielee Haff, MD  polyethylene glycol [facility] / GLYCOLAX) packet Take 17 g by mouth daily. 05/23/18  Yes Bonnielee Haff, MD  pravastatin (PRAVACHOL) 10 MG tablet Take 1 tablet (10 mg total) by mouth every evening. 05/22/18  Yes Bonnielee Haff, MD  traZODone (DESYREL) 100 MG tablet Take 1 tablet (100 mg total) by mouth at bedtime. 05/22/18  Yes Bonnielee Haff, MD  venlafaxine XR (EFFEXOR-XR) 150 MG 24 hr capsule Take 1 capsule (150 mg total) by mouth 2 (two) times daily. 05/22/18  Yes Bonnielee Haff, MD     No Known Allergies  ROS:  Out of a complete 14 system review of symptoms, the patient complains only of the following symptoms, and all other reviewed systems are negative.  Mild memory problems Right-sided weakness Walking difficulty  Height 6\' 3"  (1.905 m), weight (!) 312 lb 9.6 oz (141.8 kg).  Physical Exam  General: The patient is alert and cooperative at the time of the examination.  The patient is moderately to markedly obese.  Eyes: Pupils are equal, round,  and reactive to light. Discs are flat bilaterally.  Neck: The neck is supple, no carotid bruits are noted.  Respiratory: The respiratory examination is clear.  Cardiovascular: The cardiovascular examination reveals a regular rate and rhythm, no obvious murmurs or rubs are noted.  Skin: Extremities are with 1+ edema at the ankles bilaterally.  Neurologic Exam  Mental status: The patient is alert and oriented x 3 at the time of the examination. The patient has apparent normal recent and remote memory, with an apparently normal attention span and concentration ability.  Cranial nerves: Facial symmetry is present. There is good sensation of the face to pinprick and soft touch bilaterally. The strength of the facial muscles and the muscles to head turning and shoulder shrug are normal bilaterally. Speech is well enunciated, no aphasia or dysarthria is noted. Extraocular movements are full. Visual fields are full. The tongue is midline, and the patient has symmetric elevation of the soft palate. No obvious hearing deficits are noted.  Motor: The motor testing reveals 5 over 5 strength of the upper extremities.  The patient has 4 -/5 strength with hip flexion on the right, 4/5 on the left.  The patient has 4 -/5 strength with knee extension on the right, 4/5 on the left.  Good symmetric motor tone is noted throughout.  Sensory: Sensory testing is notable for some decreased pinprick sensation on the right arm as compared to the left.  Pinprick sensation is decreased in both lower extremities.  Vibration sensation is reduced in both feet but more prominently on the right than the left.  Vibration sensation is symmetric in the arms.  Coordination: Cerebellar testing reveals slight dysmetria finger-nose-finger with the right, normal on the left.  Rapid alternating movements are decreased on the right arm as compared to the left.  The patient is not able to perform heel-to-shin well on either side.  Gait  and station: The patient currently is not ambulatory, and a scooter.  Reflexes: Deep tendon reflexes are symmetric, but are somewhat depressed bilaterally. Toes are downgoing bilaterally.   MRI brain 04/08/21:  IMPRESSION: 1. Acute infarction of the left para median pons. No hemorrhage or mass effect. Extensive chronic small-vessel ischemic changes elsewhere throughout the brain as outlined above. 2. MR angiography does not show any anterior circulation large or medium branch vessel occlusion. Distal vessel atherosclerotic irregularity. Occlusion of the left vertebral artery at the foramen magnum. Retrograde flow in the distal left vertebral artery back to left PICA.  * MRI scan images were reviewed online. I agree with the written report.   MRA head 04/08/21:  IMPRESSION: 1. Acute infarction of the left para median pons. No hemorrhage or mass effect. Extensive chronic small-vessel ischemic changes elsewhere throughout the brain as outlined above. 2. MR angiography does not show any anterior circulation large or medium branch vessel occlusion. Distal vessel atherosclerotic irregularity. Occlusion of the left vertebral artery at the foramen magnum. Retrograde flow in the distal left vertebral artery back to left PICA.    Assessment/Plan:  1.  Mild cognitive impairment  2.  History of thoracic myelopathy, paraparesis  3.  Left pontine stroke, right hemiparesis  The patient has not had a full evaluation today.  The patient will be getting a 2D echocardiogram in the near future, he will need to have a lipid panel, the LDL should be less than 70 following the stroke.  He will remain on aspirin and Plavix combination for another 2 weeks and then convert to Plavix alone.  He will be getting some physical therapy in the near future.  I will follow-up with him in the next 3 to 4 months.  Jill Alexanders MD 04/29/2021 3:00 PM  [facility] [address] [place], [place]  [address]  Phone [phone] Fax [phone]

## 2021-05-04 DIAGNOSIS — Z8673 Personal history of transient ischemic attack (TIA), and cerebral infarction without residual deficits: Secondary | ICD-10-CM | POA: Diagnosis not present

## 2021-05-05 ENCOUNTER — Ambulatory Visit (HOSPITAL_COMMUNITY): Payer: Medicare Other | Attending: Internal Medicine | Admitting: Physical Therapy

## 2021-05-05 ENCOUNTER — Other Ambulatory Visit: Payer: Self-pay

## 2021-05-05 ENCOUNTER — Encounter (HOSPITAL_COMMUNITY): Payer: Self-pay | Admitting: Physical Therapy

## 2021-05-05 DIAGNOSIS — M6281 Muscle weakness (generalized): Secondary | ICD-10-CM | POA: Diagnosis not present

## 2021-05-05 DIAGNOSIS — R2689 Other abnormalities of gait and mobility: Secondary | ICD-10-CM | POA: Insufficient documentation

## 2021-05-05 NOTE — Therapy (Signed)
[place], [place], [address] Phone: [phone]   Fax:  [phone]  Physical Therapy Evaluation  Patient Details  Name: Cody Velez MRN: [id number] Date of Birth: [birth year] Referring Provider (PT): Asencion Noble MD   Encounter Date: 05/05/2021   PT End of Session - 05/05/21 1358    Visit Number 1    Number of Visits 12    Date for PT Re-Evaluation 06/16/21    Authorization Type Primary Medicare Secondary  BCBS (75 VL, no auth required)    Progress Note Due on Visit 10    PT Start Time 1400    PT Stop Time 1440    PT Time Calculation (min) 40 min    Activity Tolerance Patient tolerated treatment well    Behavior During Therapy [facility] for tasks assessed/performed           Past Medical History:  Diagnosis Date  . Allergic rhinitis   . Arthritis   . B12 deficiency   . Cervicogenic headache   . Childhood asthma   . Chronic back pain   . Coronary atherosclerosis of native coronary artery    Mild nonobstructive CAD at catheterization January 2015  . Dementia ([place])   . Depression   . Essential hypertension   . Falls   . GERD (gastroesophageal reflux disease)   . Glaucoma   . History of blood transfusion   . History of cerebrovascular disease 07/23/2015  . History of kidney stones   . History of pneumonia 02/2011  . Hyperlipidemia   . OSA (obstructive sleep apnea)    CPAP - Dr. [name] - uses cpap every night  . Pneumonia   . Prostate cancer ([place])   . PTSD (post-traumatic stress disorder)    [place]  . Rectal bleeding   . Stroke ([place]) 03/2021  . Wheelchair bound     Past Surgical History:  Procedure Laterality Date  . APPLICATION OF ROBOTIC ASSISTANCE FOR SPINAL PROCEDURE N/A 03/28/2018   Procedure: APPLICATION OF ROBOTIC ASSISTANCE FOR SPINAL PROCEDURE;  Surgeon: Kristeen Miss, MD;  Location: [facility];  Service: Neurosurgery;  Laterality: N/A;  . BACK SURGERY  02/14/12   lumbar OR #7; "today redid  L1L2; replaced screws; added bone from hip"  . BILATERAL KNEE ARTHROSCOPY    . COLONOSCOPY  10/15/2008   Dr. [name]: tubular adenoma   . COLONOSCOPY  12/17/2003   PYP:PJKDTO rectal and colon  . COLONOSCOPY N/A 09/05/2014   Procedure: COLONOSCOPY;  Surgeon: Daneil Dolin, MD;  [facility];  Service: Endoscopy;  Laterality: N/A;  7:30-rescheduled 9/17 to [place] notified pt  . CYSTOSCOPY WITH STENT PLACEMENT Right 01/27/2016   Procedure: CYSTOSCOPY WITH STENT PLACEMENT;  Surgeon: [name], MD;  Location: [facility];  Service: Urology;  Laterality: Right;  . CYSTOSCOPY/RETROGRADE/URETEROSCOPY/STONE EXTRACTION WITH BASKET Right 01/27/2016   Procedure: CYSTOSCOPY, RIGHT RETROGRADE, RIGHT URETEROSCOPY, STONE EXTRACTION ;  Surgeon: [name], MD;  Location: [facility];  Service: Urology;  Laterality: Right;  . ESOPHAGOGASTRODUODENOSCOPY  10/15/2008     Dr Rourk:Schatzki's ring status post dilation and disruption via 74 F Maloney dilator/ otherwise unremarkable esophagus, small hiatal hernia, multiple fundal gland polyps not manipulated, gastritis, negative H.pylori  . ESOPHAGOGASTRODUODENOSCOPY  06/21/02   IZT:IWPYK sliding hiatal hernia with mild changes of reflux esophagitis limited to gastroesophageal junction.  Noncritical ring at distal esophagus, 3 cm proximal to gastroesophageal junction/Antral gastritis  . [place]   "  broke face playing softball"  . FRACTURE SURGERY     "left wrist; broke it; took spur off"  . HOLMIUM LASER APPLICATION Right 05/23/8755   Procedure: HOLMIUM LASER APPLICATION;  Surgeon: [name], MD;  Location: [facility];  Service: Urology;  Laterality: Right;  . KNEE ARTHROSCOPY Right 05/18/2018   Procedure: PARTIAL MEDIAL MENISECTOMY AND SURGICAL LAVAGE AND CHONDROPLASTY;  Surgeon: [name], MD;  Location: [facility];  Service: Orthopedics;  Laterality: Right;  . LEFT HEART CATHETERIZATION WITH CORONARY ANGIOGRAM N/A 12/27/2013    Procedure: LEFT HEART CATHETERIZATION WITH CORONARY ANGIOGRAM;  Surgeon: [name], MD;  Location: [facility] CATH LAB;  Service: Cardiovascular;  Laterality: N/A;  . neck epidural    . POSTERIOR LUMBAR FUSION 4 LEVEL N/A 03/28/2018   Procedure: Thoracic eight -Lumbar two- FIXATION WITH SCREW PLACEMENT, DECOMPRESSION Thoracic ten-Thoracic eleven  FOR OSTEOMYELITIS;  Surgeon: Kristeen Miss, MD;  Location: [facility];  Service: Neurosurgery;  Laterality: N/A;  . SHOULDER SURGERY Bilateral   . SPINAL CORD STIMULATOR REMOVAL N/A 02/25/2020   Procedure: LUMBAR SPINAL CORD STIMULATOR REMOVAL;  Surgeon: Kristeen Miss, MD;  Location: [facility];  Service: Neurosurgery;  Laterality: N/A;  . TEE WITHOUT CARDIOVERSION N/A 03/21/2018   Procedure: TRANSESOPHAGEAL ECHOCARDIOGRAM (TEE) WITH PROPOFOL;  Surgeon: [name], MD;  [facility];  Service: Cardiovascular;  Laterality: N/A;    There were no vitals filed for this visit.    Subjective Assessment - 05/05/21 1359    Subjective Patient is a 75 y.o. male who presents to physical therapy s/p recent stroke with right sided weakness. Patient states he had a slight stoke. His right leg has been a lot weaker. He has been weaker than normal. His endurance is worse. He is having trouble with mobility with the R leg. He is having trouble with transfers. He gets frustrated easily. Patient states main goals is to improve strength and endurance.    Patient is accompained by: --   GF   Pertinent History Multiple lumbar and cervical fusions, paraplegic, CVA April 03, 2021    Limitations Standing;Walking;House hold activities    Currently in Pain? No/denies              [facility] PT Assessment - 05/05/21 0001      Assessment   Medical Diagnosis Recent Stroke R sided weakness    Referring Provider (PT) Asencion Noble MD    Onset Date/Surgical Date 04/03/21    Next MD Visit July 1st    Prior Therapy yes      Precautions   Precautions Fall      Restrictions    Weight Bearing Restrictions No      Balance Screen   Has the patient fallen in the past 6 months Yes    How many times? 2    Has the patient had a decrease in activity level because of a fear of falling?  No    Is the patient reluctant to leave their home because of a fear of falling?  No      Prior Function   Level of Independence Independent with homemaking with wheelchair;Independent with basic ADLs    Vocation Retired      Charity fundraiser Status Within Functional Limits for tasks assessed      Observation/Other Assessments   Observations Seated in WC, bilateral LE edema    Focus on Therapeutic Outcomes (FOTO)  n/a      ROM / Strength   AROM /  PROM / Strength AROM;Strength      Strength   Overall Strength Comments lacking TKE bilaterally    Strength Assessment Site Hip;Knee;Ankle    Right/Left Hip Right;Left    Right Hip Flexion 3-/5    Left Hip Flexion 4+/5    Right/Left Knee Right;Left    Right Knee Flexion 4+/5    Right Knee Extension 4+/5    Left Knee Flexion 5/5    Left Knee Extension 5/5    Right/Left Ankle Right;Left    Right Ankle Dorsiflexion 4-/5    Left Ankle Dorsiflexion 5/5      Transfers   Comments transfer from [facility] to plinth without physical assist, transfers to standing with RW with min/mod assist      Standardized Balance Assessment   Standardized Balance Assessment Berg Balance Test      Berg Balance Test   Sit to Stand Able to stand using hands after several tries    Standing Unsupported Needs several tries to stand 30 seconds unsupported    Sitting with Back Unsupported but Feet Supported on Floor or Stool Able to sit 2 minutes under supervision    Stand to Sit Sits safely with minimal use of hands    Transfers Able to transfer with verbal cueing and /or supervision    Standing Unsupported with Eyes Closed Unable to keep eyes closed 3 seconds but stays steady    Standing Unsupported with Feet Together Needs help to attain position  and unable to hold for 15 seconds    From Standing, Reach Forward with Outstretched Arm Reaches forward but needs supervision    From Standing Position, Pick up Object from Floor Unable to try/needs assist to keep balance    From Standing Position, Turn to Look Behind Over each Shoulder Needs supervision when turning    Turn 360 Degrees Needs assistance while turning    Standing Unsupported, Alternately Place Feet on Step/Stool Needs assistance to keep from falling or unable to try    Standing Unsupported, One Foot in [employer] balance while stepping or standing    Standing on One Leg Unable to try or needs assist to prevent fall    Total Score 15    Berg comment: high risk of falls, unsteady without RW                      Objective measurements completed on examination: See above findings.       [facility] Adult PT Treatment/Exercise - 05/05/21 0001      Exercises   Exercises Knee/Hip      Knee/Hip Exercises: Seated   Other Seated Knee/Hip Exercises marching alternating x 10    Sit to Sand 10 reps                  PT Education - 05/05/21 1357    Education Details Patient educated on exam findings, POC, scope of PT, HEP, safety with mobility    Person(s) Educated Patient;Other (comment)   GF   Methods Explanation;Demonstration;Handout    Comprehension Returned demonstration;Verbalized understanding            PT Short Term Goals - 05/05/21 1502      PT SHORT TERM GOAL #1   Title Patient will be independent in self management strategies to improve quality of life and functional outcomes.    Time 3    Period Weeks    Status New    Target Date 05/26/21  PT SHORT TERM GOAL #2   Title Patient will report at least 25% improvement in overall symptoms and/or functional ability.    Time 3    Period Weeks    Status New    Target Date 05/26/21             PT Long Term Goals - 05/05/21 1503      PT LONG TERM GOAL #1   Title Patient will report  at least 50% improvement in overall symptoms and/or functional ability.    Time 6    Period Weeks    Status New    Target Date 06/16/21      PT LONG TERM GOAL #2   Title Patient will demonstrate ability to stand at a support surface for 10 minutes without a rest break in order to perform household chores and ADL's with increased ease.     Time 6    Period Weeks    Status New    Target Date 06/16/21      PT LONG TERM GOAL #3   Title Patient will demonstrate ability to ambulate 120 feet without rest break using LRAD with no more than minimal assistance.    Time 6    Period Weeks    Status New    Target Date 06/16/21      PT LONG TERM GOAL #4   Title Patient will be participating in exercise program at home or [employer], 40-60 minutes of moderate aerobic exercise in addition to therapy, to improve health/wellness in order to lose weight for improved mobility and function.    Time 6    Period Weeks    Status New    Target Date 06/16/21      PT LONG TERM GOAL #5   Title Patient will score at least 37 on Berg balance test in order to reduce the risk of falls.    Time 6    Period Weeks    Status New    Target Date 06/16/21                  Plan - 05/05/21 1509    Clinical Impression Statement Patient is a 75 y.o. male who presents to physical therapy s/p recent stroke with right sided weakness. He presents with deficits in LE strength, endurance, gait, balance, transfers, and functional mobility with ADL. He is having to modify and restrict ADL as indicated by subjective information and objective measures which is affecting overall participation. Patient at a very high risk for falls as indicated by berg balance test. Patient will benefit from skilled physical therapy in order to improve function and reduce impairment.    Personal Factors and Comorbidities Age;Fitness;Behavior Pattern;Past/Current Experience;Comorbidity 3+;Time since onset of injury/illness/exacerbation     Comorbidities CVA, lumbar and cervical fusions, paraplegia    Examination-Activity Limitations Locomotion Level;Transfers;Bed Mobility;Squat;Stairs;Stand;Lift;Hygiene/Grooming    Examination-Participation Restrictions Meal Prep;Cleaning;Community Activity;Volunteer;Shop;Yard Work;Laundry    Stability/Clinical Decision Making Stable/Uncomplicated    Clinical Decision Making Low    Rehab Potential Good    PT Frequency 2x / week    PT Duration 6 weeks    PT Treatment/Interventions ADLs/Self Care Home Management;Aquatic Therapy;Cryotherapy;Electrical Stimulation;Iontophoresis 4mg /ml Dexamethasone;Moist Heat;Traction;Ultrasound;DME Instruction;Gait training;Stair training;Functional mobility training;Therapeutic activities;Therapeutic exercise;Balance training;Neuromuscular re-education;Patient/family education;Manual techniques;Dry needling;Energy conservation;Vasopneumatic Device;Splinting;Taping;Vestibular;Spinal Manipulations;Joint Manipulations;Manual lymph drainage;Orthotic Fit/Training;Compression bandaging    PT Next Visit Plan begin LE strengthening, gait, balance training    PT Home Exercise Plan marching, STS    Consulted and Agree with Plan of Care  Patient           Patient will benefit from skilled therapeutic intervention in order to improve the following deficits and impairments:  Abnormal gait,Difficulty walking,Decreased endurance,Decreased activity tolerance,Pain,Decreased balance,Impaired flexibility,Improper body mechanics,Postural dysfunction,Decreased strength,Decreased mobility  Visit Diagnosis: Muscle weakness (generalized)  Other abnormalities of gait and mobility     Problem List Patient Active Problem List   Diagnosis Date Noted  . Stroke ([place]) 04/29/2021  . Infection of spinal cord stimulator ([place]) 02/26/2020  . Pseudogout of knee   . Pyogenic arthritis of right knee joint ([place])   . Falls frequently 05/08/2018  . Fall   . Sleep disturbance   . Post-op pain    . Hypokalemia   . Morbid obesity ([place])   . Postoperative pain   . DVT, lower extremity, distal, acute, bilateral ([place])   . Acute blood loss anemia   . Anemia of chronic disease   . Essential hypertension   . Bacteremia   . Myelopathy ([place]) 03/30/2018  . Paraplegia ([place])   . Postlaminectomy syndrome, lumbar region   . Epidural abscess   . Abdominal distension   . Encephalopathy   . Weakness of both lower extremities   . Discitis of thoracic region   . Spinal cord stimulator status   . Staphylococcus aureus sepsis ([place]) 03/20/2018  . Bacteremia due to methicillin susceptible Staphylococcus aureus (MSSA) 03/18/2018  . Obesity 03/18/2018  . Nephrolithiasis 03/16/2018  . AKI (acute kidney injury) ([place]) 03/16/2018  . Cognitive impairment 03/16/2018  . Chronic pain syndrome 03/16/2018  . Cervicogenic headache 01/28/2016  . B12 deficiency 01/28/2016  . Memory difficulty 07/23/2015  . History of cerebrovascular disease 07/23/2015  . FH: colon cancer 08/13/2014  . Hx of adenomatous colonic polyps 08/13/2014  . Chronic back pain 05/30/2012  . CAD, NATIVE VESSEL 07/17/2010  . HYPERCHOLESTEROLEMIA 10/31/2009  . SMOKELESS TOBACCO ABUSE 10/31/2009  . Obstructive sleep apnea 10/31/2009  . Essential hypertension, benign 10/31/2009  . GERD 10/31/2009    3:18 PM, 05/05/21 Mearl Latin PT, DPT Physical Therapist at [facility] [place], [place], [address] Phone: [phone]   Fax:  [phone]  Name: Cody Velez MRN: [id number] Date of Birth: 1946-10-31

## 2021-05-05 NOTE — Patient Instructions (Signed)
Access Code: [email or web address] URL: [email or web address] Date: 05/05/2021 Prepared by: Mitzi Hansen Zygmunt Mcglinn  Exercises Sit to Stand with Counter Support - 2 x daily - 7 x weekly - 2 sets - 10 reps Seated March - 2 x daily - 7 x weekly - 2 sets - 10 reps

## 2021-05-11 ENCOUNTER — Encounter (HOSPITAL_COMMUNITY): Payer: No Typology Code available for payment source | Admitting: Physical Therapy

## 2021-05-12 ENCOUNTER — Ambulatory Visit (HOSPITAL_COMMUNITY): Payer: Medicare Other

## 2021-05-13 ENCOUNTER — Encounter (HOSPITAL_COMMUNITY): Payer: No Typology Code available for payment source

## 2021-05-14 ENCOUNTER — Encounter (HOSPITAL_COMMUNITY): Payer: Self-pay | Admitting: Physical Therapy

## 2021-05-14 ENCOUNTER — Other Ambulatory Visit: Payer: Self-pay

## 2021-05-14 ENCOUNTER — Institutional Professional Consult (permissible substitution): Payer: Medicare Other | Admitting: Neurology

## 2021-05-14 ENCOUNTER — Ambulatory Visit (HOSPITAL_COMMUNITY): Payer: Medicare Other | Admitting: Physical Therapy

## 2021-05-14 DIAGNOSIS — R2689 Other abnormalities of gait and mobility: Secondary | ICD-10-CM

## 2021-05-14 DIAGNOSIS — M6281 Muscle weakness (generalized): Secondary | ICD-10-CM | POA: Diagnosis not present

## 2021-05-14 NOTE — Therapy (Signed)
[place], [place], [address] Phone: [phone]   Fax:  [phone]  Physical Therapy Treatment  Patient Details  Name: Cody Velez MRN: [id number] Date of Birth: 1946-10-18 Referring Provider (PT): Asencion Noble MD   Encounter Date: 05/14/2021   PT End of Session - 05/14/21 1131    Visit Number 2    Number of Visits 12    Date for PT Re-Evaluation 06/16/21    Authorization Type Primary Medicare Secondary  BCBS (75 VL, no auth required)    Progress Note Due on Visit 10    PT Start Time 1132    PT Stop Time 1210    PT Time Calculation (min) 38 min    Activity Tolerance Patient tolerated treatment well    Behavior During Therapy [facility] for tasks assessed/performed           Past Medical History:  Diagnosis Date  . Allergic rhinitis   . Arthritis   . B12 deficiency   . Cervicogenic headache   . Childhood asthma   . Chronic back pain   . Coronary atherosclerosis of native coronary artery    Mild nonobstructive CAD at catheterization January 2015  . Dementia ([place])   . Depression   . Essential hypertension   . Falls   . GERD (gastroesophageal reflux disease)   . Glaucoma   . History of blood transfusion   . History of cerebrovascular disease 07/23/2015  . History of kidney stones   . History of pneumonia 02/2011  . Hyperlipidemia   . OSA (obstructive sleep apnea)    CPAP - Dr. [name] - uses cpap every night  . Pneumonia   . Prostate cancer ([place])   . PTSD (post-traumatic stress disorder)    [place]  . Rectal bleeding   . Stroke ([place]) 03/2021  . Wheelchair bound     Past Surgical History:  Procedure Laterality Date  . APPLICATION OF ROBOTIC ASSISTANCE FOR SPINAL PROCEDURE N/A 03/28/2018   Procedure: APPLICATION OF ROBOTIC ASSISTANCE FOR SPINAL PROCEDURE;  Surgeon: Kristeen Miss, MD;  Location: [place];  Service: Neurosurgery;  Laterality: N/A;  . BACK SURGERY  02/14/12   lumbar OR #7; "today redid  L1L2; replaced screws; added bone from hip"  . BILATERAL KNEE ARTHROSCOPY    . COLONOSCOPY  10/15/2008   Dr. [name]: tubular adenoma   . COLONOSCOPY  12/17/2003   TGG:YIRSWN rectal and colon  . COLONOSCOPY N/A 09/05/2014   Procedure: COLONOSCOPY;  Surgeon: Daneil Dolin, MD;  [facility];  Service: Endoscopy;  Laterality: N/A;  7:30-rescheduled 9/17 to [place] notified pt  . CYSTOSCOPY WITH STENT PLACEMENT Right 01/27/2016   Procedure: CYSTOSCOPY WITH STENT PLACEMENT;  Surgeon: [name], MD;  Location: [facility];  Service: Urology;  Laterality: Right;  . CYSTOSCOPY/RETROGRADE/URETEROSCOPY/STONE EXTRACTION WITH BASKET Right 01/27/2016   Procedure: CYSTOSCOPY, RIGHT RETROGRADE, RIGHT URETEROSCOPY, STONE EXTRACTION ;  Surgeon: [name], MD;  Location: [facility];  Service: Urology;  Laterality: Right;  . ESOPHAGOGASTRODUODENOSCOPY  10/15/2008     Dr Rourk:Schatzki's ring status post dilation and disruption via 35 F Maloney dilator/ otherwise unremarkable esophagus, small hiatal hernia, multiple fundal gland polyps not manipulated, gastritis, negative H.pylori  . ESOPHAGOGASTRODUODENOSCOPY  06/21/02   IOE:VOJJK sliding hiatal hernia with mild changes of reflux esophagitis limited to gastroesophageal junction.  Noncritical ring at distal esophagus, 3 cm proximal to gastroesophageal junction/Antral gastritis  . [place]   "  broke face playing softball"  . FRACTURE SURGERY     "left wrist; broke it; took spur off"  . HOLMIUM LASER APPLICATION Right 01/24/5808   Procedure: HOLMIUM LASER APPLICATION;  Surgeon: [name], MD;  Location: [facility];  Service: Urology;  Laterality: Right;  . KNEE ARTHROSCOPY Right 05/18/2018   Procedure: PARTIAL MEDIAL MENISECTOMY AND SURGICAL LAVAGE AND CHONDROPLASTY;  Surgeon: [name], MD;  Location: [place];  Service: Orthopedics;  Laterality: Right;  . LEFT HEART CATHETERIZATION WITH CORONARY ANGIOGRAM N/A 12/27/2013    Procedure: LEFT HEART CATHETERIZATION WITH CORONARY ANGIOGRAM;  Surgeon: [name], MD;  Location: [facility] CATH LAB;  Service: Cardiovascular;  Laterality: N/A;  . neck epidural    . POSTERIOR LUMBAR FUSION 4 LEVEL N/A 03/28/2018   Procedure: Thoracic eight -Lumbar two- FIXATION WITH SCREW PLACEMENT, DECOMPRESSION Thoracic ten-Thoracic eleven  FOR OSTEOMYELITIS;  Surgeon: Kristeen Miss, MD;  Location: [place];  Service: Neurosurgery;  Laterality: N/A;  . SHOULDER SURGERY Bilateral   . SPINAL CORD STIMULATOR REMOVAL N/A 02/25/2020   Procedure: LUMBAR SPINAL CORD STIMULATOR REMOVAL;  Surgeon: Kristeen Miss, MD;  Location: [place];  Service: Neurosurgery;  Laterality: N/A;  . TEE WITHOUT CARDIOVERSION N/A 03/21/2018   Procedure: TRANSESOPHAGEAL ECHOCARDIOGRAM (TEE) WITH PROPOFOL;  Surgeon: [name], MD;  [facility];  Service: Cardiovascular;  Laterality: N/A;    There were no vitals filed for this visit.   Subjective Assessment - 05/14/21 1132    Subjective Patient states he has had a stomach virus. He has been working on [employer].    Patient is accompained by: --   GF   Pertinent History Multiple lumbar and cervical fusions, paraplegic, CVA April 03, 2021    Limitations Standing;Walking;House hold activities    Currently in Pain? Yes    Pain Score 6     Pain Location Neck                             [facility] Adult PT Treatment/Exercise - 05/14/21 0001      Knee/Hip Exercises: Standing   Hip Flexion Both;1 set;10 reps    Hip Abduction Both;1 set;10 reps      Knee/Hip Exercises: Supine   Bridges 2 sets;10 reps    Straight Leg Raises Both;2 sets;10 reps    Other Supine Knee/Hip Exercises hip iso 10x 5 second holds    Other Supine Knee/Hip Exercises hamstring iso with green ball 10 x 5 second holds bilateral                  PT Education - 05/14/21 1132    Education Details HEP, exercise mechanics    Person(s) Educated Patient    Methods  Explanation;Demonstration    Comprehension Verbalized understanding;Returned demonstration            PT Short Term Goals - 05/05/21 1502      PT SHORT TERM GOAL #1   Title Patient will be independent in self management strategies to improve quality of life and functional outcomes.    Time 3    Period Weeks    Status New    Target Date 05/26/21      PT SHORT TERM GOAL #2   Title Patient will report at least 25% improvement in overall symptoms and/or functional ability.    Time 3    Period Weeks    Status New    Target Date 05/26/21  PT Long Term Goals - 05/05/21 1503      PT LONG TERM GOAL #1   Title Patient will report at least 50% improvement in overall symptoms and/or functional ability.    Time 6    Period Weeks    Status New    Target Date 06/16/21      PT LONG TERM GOAL #2   Title Patient will demonstrate ability to stand at a support surface for 10 minutes without a rest break in order to perform household chores and ADL's with increased ease.     Time 6    Period Weeks    Status New    Target Date 06/16/21      PT LONG TERM GOAL #3   Title Patient will demonstrate ability to ambulate 120 feet without rest break using LRAD with no more than minimal assistance.    Time 6    Period Weeks    Status New    Target Date 06/16/21      PT LONG TERM GOAL #4   Title Patient will be participating in exercise program at home or [employer], 40-60 minutes of moderate aerobic exercise in addition to therapy, to improve health/wellness in order to lose weight for improved mobility and function.    Time 6    Period Weeks    Status New    Target Date 06/16/21      PT LONG TERM GOAL #5   Title Patient will score at least 37 on Berg balance test in order to reduce the risk of falls.    Time 6    Period Weeks    Status New    Target Date 06/16/21                 Plan - 05/14/21 1132    Clinical Impression Statement Patient unsteady with standing  exercises today and fatigues quickly requiring seated rest breaks. Patient able to complete supine exercises with fair mechanics with minimal cueing. Patient requires cueing for controlled exercises and for rest breaks when completing with impaired mechanics. Began table isometrics today for LE strengthening.  Patient will continue to benefit from skilled physical therapy in order to reduce impairment and improve function.    Personal Factors and Comorbidities Age;Fitness;Behavior Pattern;Past/Current Experience;Comorbidity 3+;Time since onset of injury/illness/exacerbation    Comorbidities CVA, lumbar and cervical fusions, paraplegia    Examination-Activity Limitations Locomotion Level;Transfers;Bed Mobility;Squat;Stairs;Stand;Lift;Hygiene/Grooming    Examination-Participation Restrictions Meal Prep;Cleaning;Community Activity;Volunteer;Shop;Yard Work;Laundry    Stability/Clinical Decision Making Stable/Uncomplicated    Rehab Potential Good    PT Frequency 2x / week    PT Duration 6 weeks    PT Treatment/Interventions ADLs/Self Care Home Management;Aquatic Therapy;Cryotherapy;Electrical Stimulation;Iontophoresis 4mg /ml Dexamethasone;Moist Heat;Traction;Ultrasound;DME Instruction;Gait training;Stair training;Functional mobility training;Therapeutic activities;Therapeutic exercise;Balance training;Neuromuscular re-education;Patient/family education;Manual techniques;Dry needling;Energy conservation;Vasopneumatic Device;Splinting;Taping;Vestibular;Spinal Manipulations;Joint Manipulations;Manual lymph drainage;Orthotic Fit/Training;Compression bandaging    PT Next Visit Plan continue LE strengthening, gait, balance training    PT Home Exercise Plan marching, STS    Consulted and Agree with Plan of Care Patient           Patient will benefit from skilled therapeutic intervention in order to improve the following deficits and impairments:  Abnormal gait,Difficulty walking,Decreased endurance,Decreased  activity tolerance,Pain,Decreased balance,Impaired flexibility,Improper body mechanics,Postural dysfunction,Decreased strength,Decreased mobility  Visit Diagnosis: Muscle weakness (generalized)  Other abnormalities of gait and mobility     Problem List Patient Active Problem List   Diagnosis Date Noted  . Stroke ([place]) 04/29/2021  . Infection of spinal cord stimulator (  [place]) 02/26/2020  . Pseudogout of knee   . Pyogenic arthritis of right knee joint ([place])   . Falls frequently 05/08/2018  . Fall   . Sleep disturbance   . Post-op pain   . Hypokalemia   . Morbid obesity ([place])   . Postoperative pain   . DVT, lower extremity, distal, acute, bilateral ([place])   . Acute blood loss anemia   . Anemia of chronic disease   . Essential hypertension   . Bacteremia   . Myelopathy ([place]) 03/30/2018  . Paraplegia ([place])   . Postlaminectomy syndrome, lumbar region   . Epidural abscess   . Abdominal distension   . Encephalopathy   . Weakness of both lower extremities   . Discitis of thoracic region   . Spinal cord stimulator status   . Staphylococcus aureus sepsis ([place]) 03/20/2018  . Bacteremia due to methicillin susceptible Staphylococcus aureus (MSSA) 03/18/2018  . Obesity 03/18/2018  . Nephrolithiasis 03/16/2018  . AKI (acute kidney injury) ([place]) 03/16/2018  . Cognitive impairment 03/16/2018  . Chronic pain syndrome 03/16/2018  . Cervicogenic headache 01/28/2016  . B12 deficiency 01/28/2016  . Memory difficulty 07/23/2015  . History of cerebrovascular disease 07/23/2015  . FH: colon cancer 08/13/2014  . Hx of adenomatous colonic polyps 08/13/2014  . Chronic back pain 05/30/2012  . CAD, NATIVE VESSEL 07/17/2010  . HYPERCHOLESTEROLEMIA 10/31/2009  . SMOKELESS TOBACCO ABUSE 10/31/2009  . Obstructive sleep apnea 10/31/2009  . Essential hypertension, benign 10/31/2009  . GERD 10/31/2009    12:07 PM, 05/14/21 Cody Velez PT, DPT Physical Therapist at [facility] [place], [place], [address] Phone: [phone]   Fax:  [phone]  Name: Cody Velez MRN: [id number] Date of Birth: 12-Jun-1946

## 2021-05-19 ENCOUNTER — Encounter (HOSPITAL_COMMUNITY): Payer: Self-pay

## 2021-05-19 ENCOUNTER — Other Ambulatory Visit: Payer: Self-pay

## 2021-05-19 ENCOUNTER — Ambulatory Visit (HOSPITAL_COMMUNITY): Payer: Medicare Other

## 2021-05-19 DIAGNOSIS — R2689 Other abnormalities of gait and mobility: Secondary | ICD-10-CM

## 2021-05-19 DIAGNOSIS — M6281 Muscle weakness (generalized): Secondary | ICD-10-CM | POA: Diagnosis not present

## 2021-05-19 NOTE — Therapy (Signed)
[place]ele[place], [place], [address] Phone: [phone]   Fax:  [phone]  Physical Therapy Treatment  Patient Details  Name: Cody Velez MRN: [id number] Date of Birth: [birth year] Referring Provider (PT): Asencion Noble MD   Encounter Date: 05/19/2021   PT End of Session - 05/19/21 0936    Visit Number 3    Number of Visits 12    Date for PT Re-Evaluation 06/16/21    Authorization Type Primary Medicare Secondary  BCBS (75 VL, no auth required)    Progress Note Due on Visit 10    PT Start Time 0924   late arrival   PT Stop Time 1002    PT Time Calculation (min) 38 min    Equipment Utilized During Treatment Gait belt    Activity Tolerance Patient tolerated treatment well;Patient limited by pain;Patient limited by fatigue    Behavior During Therapy [facility] for tasks assessed/performed           Past Medical History:  Diagnosis Date  . Allergic rhinitis   . Arthritis   . B12 deficiency   . Cervicogenic headache   . Childhood asthma   . Chronic back pain   . Coronary atherosclerosis of native coronary artery    Mild nonobstructive CAD at catheterization January 2015  . Dementia ([place])   . Depression   . Essential hypertension   . Falls   . GERD (gastroesophageal reflux disease)   . Glaucoma   . History of blood transfusion   . History of cerebrovascular disease 07/23/2015  . History of kidney stones   . History of pneumonia 02/2011  . Hyperlipidemia   . OSA (obstructive sleep apnea)    CPAP - Dr. [name] - uses cpap every night  . Pneumonia   . Prostate cancer ([place])   . PTSD (post-traumatic stress disorder)    [place]  . Rectal bleeding   . Stroke ([place]) 03/2021  . Wheelchair bound     Past Surgical History:  Procedure Laterality Date  . APPLICATION OF ROBOTIC ASSISTANCE FOR SPINAL PROCEDURE N/A 03/28/2018   Procedure: APPLICATION OF ROBOTIC ASSISTANCE FOR SPINAL PROCEDURE;  Surgeon: Kristeen Miss, MD;   Location: [place];  Service: Neurosurgery;  Laterality: N/A;  . BACK SURGERY  02/14/12   lumbar OR #7; "today redid L1L2; replaced screws; added bone from hip"  . BILATERAL KNEE ARTHROSCOPY    . COLONOSCOPY  10/15/2008   Dr. [name]: tubular adenoma   . COLONOSCOPY  12/17/2003   CZY:SAYTKZ rectal and colon  . COLONOSCOPY N/A 09/05/2014   Procedure: COLONOSCOPY;  Surgeon: Daneil Dolin, MD;  [facility];  Service: Endoscopy;  Laterality: N/A;  7:30-rescheduled 9/17 to [place] notified pt  . CYSTOSCOPY WITH STENT PLACEMENT Right 01/27/2016   Procedure: CYSTOSCOPY WITH STENT PLACEMENT;  Surgeon: [name], MD;  [facility]: [facility];  Service: Urology;  Laterality: Right;  . CYSTOSCOPY/RETROGRADE/URETEROSCOPY/STONE EXTRACTION WITH BASKET Right 01/27/2016   Procedure: CYSTOSCOPY, RIGHT RETROGRADE, RIGHT URETEROSCOPY, STONE EXTRACTION ;  Surgeon: [name], MD;  Location: [facility];  Service: Urology;  Laterality: Right;  . ESOPHAGOGASTRODUODENOSCOPY  10/15/2008     Dr Rourk:Schatzki's ring status post dilation and disruption via 94 F Maloney dilator/ otherwise unremarkable esophagus, small hiatal hernia, multiple fundal gland polyps not manipulated, gastritis, negative H.pylori  . ESOPHAGOGASTRODUODENOSCOPY  06/21/02   SWF:UXNAT sliding hiatal hernia with mild changes of reflux esophagitis limited to gastroesophageal junction.  Noncritical ring at distal  esophagus, 3 cm proximal to gastroesophageal junction/Antral gastritis  . [place]   "broke face playing softball"  . FRACTURE SURGERY     "left wrist; broke it; took spur off"  . HOLMIUM LASER APPLICATION Right 08/24/6386   Procedure: HOLMIUM LASER APPLICATION;  Surgeon: [name], MD;  [facility];  Service: Urology;  Laterality: Right;  . KNEE ARTHROSCOPY Right 05/18/2018   Procedure: PARTIAL MEDIAL MENISECTOMY AND SURGICAL LAVAGE AND CHONDROPLASTY;  Surgeon: [name], MD;  Location: [place];  Service: Orthopedics;  Laterality: Right;  . LEFT HEART CATHETERIZATION WITH CORONARY ANGIOGRAM N/A 12/27/2013   Procedure: LEFT HEART CATHETERIZATION WITH CORONARY ANGIOGRAM;  Surgeon: [name], MD;  Location: [facility] CATH LAB;  Service: Cardiovascular;  Laterality: N/A;  . neck epidural    . POSTERIOR LUMBAR FUSION 4 LEVEL N/A 03/28/2018   Procedure: Thoracic eight -Lumbar two- FIXATION WITH SCREW PLACEMENT, DECOMPRESSION Thoracic ten-Thoracic eleven  FOR OSTEOMYELITIS;  Surgeon: Kristeen Miss, MD;  Location: [place];  Service: Neurosurgery;  Laterality: N/A;  . SHOULDER SURGERY Bilateral   . SPINAL CORD STIMULATOR REMOVAL N/A 02/25/2020   Procedure: LUMBAR SPINAL CORD STIMULATOR REMOVAL;  Surgeon: Kristeen Miss, MD;  Location: [place];  Service: Neurosurgery;  Laterality: N/A;  . TEE WITHOUT CARDIOVERSION N/A 03/21/2018   Procedure: TRANSESOPHAGEAL ECHOCARDIOGRAM (TEE) WITH PROPOFOL;  Surgeon: [name], MD;  [facility];  Service: Cardiovascular;  Laterality: N/A;    There were no vitals filed for this visit.   Subjective Assessment - 05/19/21 0926    Subjective Pt stated he has had neck pain for about a week now, pain scale 7/10.  Stated he had a rough night sleeping.    Pertinent History Multiple lumbar and cervical fusions, paraplegic, CVA April 03, 2021    Currently in Pain? Yes    Pain Score 7     Pain Location Neck    Pain Orientation Left    Pain Descriptors / Indicators Sore;Aching;Dull    Pain Type Acute pain    Pain Onset 1 to 4 weeks ago                             [facility] Adult PT Treatment/Exercise - 05/19/21 0001      Knee/Hip Exercises: Stretches   Passive Hamstring Stretch 1 rep;20 seconds    Passive Hamstring Stretch Limitations long sitting on mat      Knee/Hip Exercises: Standing   Hip Flexion Both;1 set;10 reps    Hip Abduction Both;1 set;10 reps    Other Standing Knee Exercises 3RT sidestep inside // bars      Knee/Hip  Exercises: Seated   Long Arc Quad Both;10 reps    Sit to [employer] 10 reps      Knee/Hip Exercises: Supine   Bridges 2 sets;10 reps    Straight Leg Raises Both;2 sets;10 reps                    PT Short Term Goals - 05/05/21 1502      PT SHORT TERM GOAL #1   Title Patient will be independent in self management strategies to improve quality of life and functional outcomes.    Time 3    Period Weeks    Status New    Target Date 05/26/21      PT SHORT TERM GOAL #2   Title Patient will report at least  25% improvement in overall symptoms and/or functional ability.    Time 3    Period Weeks    Status New    Target Date 05/26/21             PT Long Term Goals - 05/05/21 1503      PT LONG TERM GOAL #1   Title Patient will report at least 50% improvement in overall symptoms and/or functional ability.    Time 6    Period Weeks    Status New    Target Date 06/16/21      PT LONG TERM GOAL #2   Title Patient will demonstrate ability to stand at a support surface for 10 minutes without a rest break in order to perform household chores and ADL's with increased ease.     Time 6    Period Weeks    Status New    Target Date 06/16/21      PT LONG TERM GOAL #3   Title Patient will demonstrate ability to ambulate 120 feet without rest break using LRAD with no more than minimal assistance.    Time 6    Period Weeks    Status New    Target Date 06/16/21      PT LONG TERM GOAL #4   Title Patient will be participating in exercise program at home or [employer], 40-60 minutes of moderate aerobic exercise in addition to therapy, to improve health/wellness in order to lose weight for improved mobility and function.    Time 6    Period Weeks    Status New    Target Date 06/16/21      PT LONG TERM GOAL #5   Title Patient will score at least 37 on Berg balance test in order to reduce the risk of falls.    Time 6    Period Weeks    Status New    Target Date 06/16/21                  Plan - 05/19/21 0951    Clinical Impression Statement Session focus with LE strengthening.  Pt unsteady standing without HHA and fatigues quickly required seated rest breaks.  Upon standing pt required cueing to extend knees through session.  Added long sitting hamstring stretch and LAQ strengthening to improve knee extesion with gait.    Personal Factors and Comorbidities Age;Fitness;Behavior Pattern;Past/Current Experience;Comorbidity 3+;Time since onset of injury/illness/exacerbation    Comorbidities CVA, lumbar and cervical fusions, paraplegia    Examination-Activity Limitations Locomotion Level;Transfers;Bed Mobility;Squat;Stairs;Stand;Lift;Hygiene/Grooming    Examination-Participation Restrictions Meal Prep;Cleaning;Community Activity;Volunteer;Shop;Yard Work;Laundry    Stability/Clinical Decision Making Stable/Uncomplicated    Clinical Decision Making Low    Rehab Potential Good    PT Frequency 2x / week    PT Duration 6 weeks    PT Treatment/Interventions ADLs/Self Care Home Management;Aquatic Therapy;Cryotherapy;Electrical Stimulation;Iontophoresis 4mg /ml Dexamethasone;Moist Heat;Traction;Ultrasound;DME Instruction;Gait training;Stair training;Functional mobility training;Therapeutic activities;Therapeutic exercise;Balance training;Neuromuscular re-education;Patient/family education;Manual techniques;Dry needling;Energy conservation;Vasopneumatic Device;Splinting;Taping;Vestibular;Spinal Manipulations;Joint Manipulations;Manual lymph drainage;Orthotic Fit/Training;Compression bandaging    PT Next Visit Plan continue LE strengthening, gait, balance training    PT Home Exercise Plan marching, STS    Consulted and Agree with Plan of Care Patient           Patient will benefit from skilled therapeutic intervention in order to improve the following deficits and impairments:  Abnormal gait,Difficulty walking,Decreased endurance,Decreased activity tolerance,Pain,Decreased  balance,Impaired flexibility,Improper body mechanics,Postural dysfunction,Decreased strength,Decreased mobility  Visit Diagnosis: Muscle weakness (generalized)  Other abnormalities of gait and mobility  Problem List Patient Active Problem List   Diagnosis Date Noted  . Stroke ([place]) 04/29/2021  . Infection of spinal cord stimulator ([place]) 02/26/2020  . Pseudogout of knee   . Pyogenic arthritis of right knee joint ([place])   . Falls frequently 05/08/2018  . Fall   . Sleep disturbance   . Post-op pain   . Hypokalemia   . Morbid obesity ([place])   . Postoperative pain   . DVT, lower extremity, distal, acute, bilateral ([place])   . Acute blood loss anemia   . Anemia of chronic disease   . Essential hypertension   . Bacteremia   . Myelopathy ([place]) 03/30/2018  . Paraplegia ([place])   . Postlaminectomy syndrome, lumbar region   . Epidural abscess   . Abdominal distension   . Encephalopathy   . Weakness of both lower extremities   . Discitis of thoracic region   . Spinal cord stimulator status   . Staphylococcus aureus sepsis ([place]) 03/20/2018  . Bacteremia due to methicillin susceptible Staphylococcus aureus (MSSA) 03/18/2018  . Obesity 03/18/2018  . Nephrolithiasis 03/16/2018  . AKI (acute kidney injury) ([place]) 03/16/2018  . Cognitive impairment 03/16/2018  . Chronic pain syndrome 03/16/2018  . Cervicogenic headache 01/28/2016  . B12 deficiency 01/28/2016  . Memory difficulty 07/23/2015  . History of cerebrovascular disease 07/23/2015  . FH: colon cancer 08/13/2014  . Hx of adenomatous colonic polyps 08/13/2014  . Chronic back pain 05/30/2012  . CAD, NATIVE VESSEL 07/17/2010  . HYPERCHOLESTEROLEMIA 10/31/2009  . SMOKELESS TOBACCO ABUSE 10/31/2009  . Obstructive sleep apnea 10/31/2009  . Essential hypertension, benign 10/31/2009  . GERD 10/31/2009   Ihor Austin, LPTA/CLT; CBIS [phone]  Aldona Lento 05/19/2021, 1:10 PM  [facility] [address] [place], [place], [address] Phone: [phone]   Fax:  [phone]  Name: BENN TARVER MRN: [id number] Date of Birth: 1946-11-28

## 2021-05-21 ENCOUNTER — Encounter (HOSPITAL_COMMUNITY): Payer: Self-pay

## 2021-05-21 ENCOUNTER — Ambulatory Visit (HOSPITAL_COMMUNITY): Payer: Medicare Other | Attending: Internal Medicine

## 2021-05-21 ENCOUNTER — Other Ambulatory Visit: Payer: Self-pay

## 2021-05-21 DIAGNOSIS — R2689 Other abnormalities of gait and mobility: Secondary | ICD-10-CM | POA: Diagnosis not present

## 2021-05-21 DIAGNOSIS — R262 Difficulty in walking, not elsewhere classified: Secondary | ICD-10-CM | POA: Diagnosis not present

## 2021-05-21 DIAGNOSIS — M6281 Muscle weakness (generalized): Secondary | ICD-10-CM | POA: Diagnosis not present

## 2021-05-21 NOTE — Therapy (Signed)
[facility] [address] [place], [place], [address] Phone: [phone]   Fax:  [phone]  Physical Therapy Treatment  Patient Details  Name: Cody Velez MRN: [id number] Date of Birth: 11-18-1946 Referring Provider (PT): Asencion Noble MD   Encounter Date: 05/21/2021   PT End of Session - 05/21/21 1404    Visit Number 4    Number of Visits 12    Date for PT Re-Evaluation 06/16/21    Authorization Type Primary Medicare Secondary  BCBS (75 VL, no auth required)    Progress Note Due on Visit 10    PT Start Time 1400    PT Stop Time 1440    PT Time Calculation (min) 40 min    Equipment Utilized During Treatment Gait belt    Activity Tolerance Patient tolerated treatment well;Patient limited by fatigue    Behavior During Therapy [facility] for tasks assessed/performed           Past Medical History:  Diagnosis Date  . Allergic rhinitis   . Arthritis   . B12 deficiency   . Cervicogenic headache   . Childhood asthma   . Chronic back pain   . Coronary atherosclerosis of native coronary artery    Mild nonobstructive CAD at catheterization January 2015  . Dementia ([place])   . Depression   . Essential hypertension   . Falls   . GERD (gastroesophageal reflux disease)   . Glaucoma   . History of blood transfusion   . History of cerebrovascular disease 07/23/2015  . History of kidney stones   . History of pneumonia 02/2011  . Hyperlipidemia   . OSA (obstructive sleep apnea)    CPAP - Dr. [name] - uses cpap every night  . Pneumonia   . Prostate cancer ([place])   . PTSD (post-traumatic stress disorder)    [place]  . Rectal bleeding   . Stroke ([place]) 03/2021  . Wheelchair bound     Past Surgical History:  Procedure Laterality Date  . APPLICATION OF ROBOTIC ASSISTANCE FOR SPINAL PROCEDURE N/A 03/28/2018   Procedure: APPLICATION OF ROBOTIC ASSISTANCE FOR SPINAL PROCEDURE;  Surgeon: Kristeen Miss, MD;  Location: [facility];  Service: Neurosurgery;   Laterality: N/A;  . BACK SURGERY  02/14/12   lumbar OR #7; "today redid L1L2; replaced screws; added bone from hip"  . BILATERAL KNEE ARTHROSCOPY    . COLONOSCOPY  10/15/2008   Dr. [name]: tubular adenoma   . COLONOSCOPY  12/17/2003   QQI:WLNLGX rectal and colon  . COLONOSCOPY N/A 09/05/2014   Procedure: COLONOSCOPY;  Surgeon: Daneil Dolin, MD;  [facility];  Service: Endoscopy;  Laterality: N/A;  7:30-rescheduled 9/17 to [place] notified pt  . CYSTOSCOPY WITH STENT PLACEMENT Right 01/27/2016   Procedure: CYSTOSCOPY WITH STENT PLACEMENT;  Surgeon: [name], MD;  [facility]: [facility];  Service: Urology;  Laterality: Right;  . CYSTOSCOPY/RETROGRADE/URETEROSCOPY/STONE EXTRACTION WITH BASKET Right 01/27/2016   Procedure: CYSTOSCOPY, RIGHT RETROGRADE, RIGHT URETEROSCOPY, STONE EXTRACTION ;  Surgeon: [name], MD;  [facility];  Service: Urology;  Laterality: Right;  . ESOPHAGOGASTRODUODENOSCOPY  10/15/2008     Dr Rourk:Schatzki's ring status post dilation and disruption via 49 F Maloney dilator/ otherwise unremarkable esophagus, small hiatal hernia, multiple fundal gland polyps not manipulated, gastritis, negative H.pylori  . ESOPHAGOGASTRODUODENOSCOPY  06/21/02   QJJ:HERDE sliding hiatal hernia with mild changes of reflux esophagitis limited to gastroesophageal junction.  Noncritical ring at distal esophagus, 3 cm proximal to gastroesophageal  junction/Antral gastritis  . [place]   "broke face playing softball"  . FRACTURE SURGERY     "left wrist; broke it; took spur off"  . HOLMIUM LASER APPLICATION Right 07/20/174   Procedure: HOLMIUM LASER APPLICATION;  Surgeon: [name], MD;  Location: [facility];  Service: Urology;  Laterality: Right;  . KNEE ARTHROSCOPY Right 05/18/2018   Procedure: PARTIAL MEDIAL MENISECTOMY AND SURGICAL LAVAGE AND CHONDROPLASTY;  Surgeon: [name], MD;  Location: [facility];  Service: Orthopedics;  Laterality:  Right;  . LEFT HEART CATHETERIZATION WITH CORONARY ANGIOGRAM N/A 12/27/2013   Procedure: LEFT HEART CATHETERIZATION WITH CORONARY ANGIOGRAM;  Surgeon: [name], MD;  Location: [facility] CATH LAB;  Service: Cardiovascular;  Laterality: N/A;  . neck epidural    . POSTERIOR LUMBAR FUSION 4 LEVEL N/A 03/28/2018   Procedure: Thoracic eight -Lumbar two- FIXATION WITH SCREW PLACEMENT, DECOMPRESSION Thoracic ten-Thoracic eleven  FOR OSTEOMYELITIS;  Surgeon: Kristeen Miss, MD;  Location: [facility];  Service: Neurosurgery;  Laterality: N/A;  . SHOULDER SURGERY Bilateral   . SPINAL CORD STIMULATOR REMOVAL N/A 02/25/2020   Procedure: LUMBAR SPINAL CORD STIMULATOR REMOVAL;  Surgeon: Kristeen Miss, MD;  Location: [facility];  Service: Neurosurgery;  Laterality: N/A;  . TEE WITHOUT CARDIOVERSION N/A 03/21/2018   Procedure: TRANSESOPHAGEAL ECHOCARDIOGRAM (TEE) WITH PROPOFOL;  Surgeon: [name], MD;  [facility];  Service: Cardiovascular;  Laterality: N/A;    There were no vitals filed for this visit.   Subjective Assessment - 05/21/21 1403    Subjective Pt stated he continues to have neck pain, has adjusted his TV higher that seems to help.    Currently in Pain? Yes    Pain Score 5     Pain Location Neck    Pain Orientation Left    Pain Descriptors / Indicators Aching;Sore    Pain Type Acute pain    Pain Onset 1 to 4 weeks ago    Pain Frequency Intermittent                             [facility] Adult PT Treatment/Exercise - 05/21/21 0001      Knee/Hip Exercises: Stretches   Passive Hamstring Stretch 1 rep;20 seconds    Passive Hamstring Stretch Limitations long sitting on mat      Knee/Hip Exercises: Standing   Knee Flexion Both;10 reps    Hip Flexion Both;1 set;10 reps    Terminal Knee Extension Both;10 reps;Theraband    Theraband Level (Terminal Knee Extension) Level 3 (Green)    Terminal Knee Extension Limitations 5" holds    Functional Squat 10 reps    Other Standing  Knee Exercises 3RT sidestep inside // bars    Other Standing Knee Exercises Tandem stance 2x30"      Knee/Hip Exercises: Seated   Sit to Sand 10 reps;without UE support                    PT Short Term Goals - 05/05/21 1502      PT SHORT TERM GOAL #1   Title Patient will be independent in self management strategies to improve quality of life and functional outcomes.    Time 3    Period Weeks    Status New    Target Date 05/26/21      PT SHORT TERM GOAL #2   Title Patient will report at least 25% improvement in overall symptoms and/or  functional ability.    Time 3    Period Weeks    Status New    Target Date 05/26/21             PT Long Term Goals - 05/05/21 1503      PT LONG TERM GOAL #1   Title Patient will report at least 50% improvement in overall symptoms and/or functional ability.    Time 6    Period Weeks    Status New    Target Date 06/16/21      PT LONG TERM GOAL #2   Title Patient will demonstrate ability to stand at a support surface for 10 minutes without a rest break in order to perform household chores and ADL's with increased ease.     Time 6    Period Weeks    Status New    Target Date 06/16/21      PT LONG TERM GOAL #3   Title Patient will demonstrate ability to ambulate 120 feet without rest break using LRAD with no more than minimal assistance.    Time 6    Period Weeks    Status New    Target Date 06/16/21      PT LONG TERM GOAL #4   Title Patient will be participating in exercise program at home or [employer], 40-60 minutes of moderate aerobic exercise in addition to therapy, to improve health/wellness in order to lose weight for improved mobility and function.    Time 6    Period Weeks    Status New    Target Date 06/16/21      PT LONG TERM GOAL #5   Title Patient will score at least 37 on Berg balance test in order to reduce the risk of falls.    Time 6    Period Weeks    Status New    Target Date 06/16/21                  Plan - 05/21/21 1445    Clinical Impression Statement Continued session focus with LE strenghtening and additional static balance training.  Pt continues to fatigue quickly requiring seated rest breaks.  Added TKE to improve knee extension with standing, pt continues to require cueing to extend knees during gait activities.  Added squats for functional gluteal strengthening.  No reports of pain, was fatigued at EOS.    Personal Factors and Comorbidities Age;Fitness;Behavior Pattern;Past/Current Experience;Comorbidity 3+;Time since onset of injury/illness/exacerbation    Comorbidities CVA, lumbar and cervical fusions, paraplegia    Examination-Activity Limitations Locomotion Level;Transfers;Bed Mobility;Squat;Stairs;Stand;Lift;Hygiene/Grooming    Examination-Participation Restrictions Meal Prep;Cleaning;Community Activity;Volunteer;Shop;Yard Work;Laundry    Stability/Clinical Decision Making Stable/Uncomplicated    Clinical Decision Making Low    Rehab Potential Good    PT Frequency 2x / week    PT Duration 6 weeks    PT Treatment/Interventions ADLs/Self Care Home Management;Aquatic Therapy;Cryotherapy;Electrical Stimulation;Iontophoresis 4mg /ml Dexamethasone;Moist Heat;Traction;Ultrasound;DME Instruction;Gait training;Stair training;Functional mobility training;Therapeutic activities;Therapeutic exercise;Balance training;Neuromuscular re-education;Patient/family education;Manual techniques;Dry needling;Energy conservation;Vasopneumatic Device;Splinting;Taping;Vestibular;Spinal Manipulations;Joint Manipulations;Manual lymph drainage;Orthotic Fit/Training;Compression bandaging    PT Next Visit Plan 2MWT next session.  continue LE strengthening, gait, balance training    PT Home Exercise Plan marching, STS    Consulted and Agree with Plan of Care Patient           Patient will benefit from skilled therapeutic intervention in order to improve the following deficits and impairments:   Abnormal gait,Difficulty walking,Decreased endurance,Decreased activity tolerance,Pain,Decreased balance,Impaired flexibility,Improper body mechanics,Postural dysfunction,Decreased strength,Decreased mobility  Visit  Diagnosis: Muscle weakness (generalized)  Other abnormalities of gait and mobility     Problem List Patient Active Problem List   Diagnosis Date Noted  . Stroke ([place]) 04/29/2021  . Infection of spinal cord stimulator ([place]) 02/26/2020  . Pseudogout of knee   . Pyogenic arthritis of right knee joint ([place])   . Falls frequently 05/08/2018  . Fall   . Sleep disturbance   . Post-op pain   . Hypokalemia   . Morbid obesity ([place])   . Postoperative pain   . DVT, lower extremity, distal, acute, bilateral ([place])   . Acute blood loss anemia   . Anemia of chronic disease   . Essential hypertension   . Bacteremia   . Myelopathy ([place]) 03/30/2018  . Paraplegia ([place])   . Postlaminectomy syndrome, lumbar region   . Epidural abscess   . Abdominal distension   . Encephalopathy   . Weakness of both lower extremities   . Discitis of thoracic region   . Spinal cord stimulator status   . Staphylococcus aureus sepsis ([place]) 03/20/2018  . Bacteremia due to methicillin susceptible Staphylococcus aureus (MSSA) 03/18/2018  . Obesity 03/18/2018  . Nephrolithiasis 03/16/2018  . AKI (acute kidney injury) ([place]) 03/16/2018  . Cognitive impairment 03/16/2018  . Chronic pain syndrome 03/16/2018  . Cervicogenic headache 01/28/2016  . B12 deficiency 01/28/2016  . Memory difficulty 07/23/2015  . History of cerebrovascular disease 07/23/2015  . FH: colon cancer 08/13/2014  . Hx of adenomatous colonic polyps 08/13/2014  . Chronic back pain 05/30/2012  . CAD, NATIVE VESSEL 07/17/2010  . HYPERCHOLESTEROLEMIA 10/31/2009  . SMOKELESS TOBACCO ABUSE 10/31/2009  . Obstructive sleep apnea 10/31/2009  . Essential hypertension, benign 10/31/2009  . GERD 10/31/2009   Cody Velez, LPTA/CLT;  CBIS [phone]  Cody Velez 05/21/2021, 2:50 PM  [facility] [address] [place], [place], [address] Phone: [phone]   Fax:  [phone]  Name: Cody Velez MRN: [id number] Date of Birth: [birth year]

## 2021-05-25 ENCOUNTER — Encounter (HOSPITAL_COMMUNITY): Payer: Self-pay | Admitting: Physical Therapy

## 2021-05-25 ENCOUNTER — Other Ambulatory Visit: Payer: Self-pay

## 2021-05-25 ENCOUNTER — Ambulatory Visit (HOSPITAL_COMMUNITY): Payer: Medicare Other | Admitting: Physical Therapy

## 2021-05-25 DIAGNOSIS — R2689 Other abnormalities of gait and mobility: Secondary | ICD-10-CM | POA: Diagnosis not present

## 2021-05-25 DIAGNOSIS — M6281 Muscle weakness (generalized): Secondary | ICD-10-CM

## 2021-05-25 DIAGNOSIS — R262 Difficulty in walking, not elsewhere classified: Secondary | ICD-10-CM | POA: Diagnosis not present

## 2021-05-25 NOTE — Therapy (Signed)
[facility] [address] [place], [place], [address] Phone: [phone]   Fax:  [phone]  Physical Therapy Treatment  Patient Details  Name: Cody Velez MRN: [id number] Date of Birth: March 18, 1946 Referring Provider (PT): Asencion Noble MD   Encounter Date: 05/25/2021   PT End of Session - 05/25/21 1402    Visit Number 5    Number of Visits 12    Date for PT Re-Evaluation 06/16/21    Authorization Type Primary Medicare Secondary  BCBS (75 VL, no auth required)    Progress Note Due on Visit 10    PT Start Time 1402    PT Stop Time 1440    PT Time Calculation (min) 38 min    Equipment Utilized During Treatment Gait belt    Activity Tolerance Patient tolerated treatment well;Patient limited by fatigue    Behavior During Therapy [facility] for tasks assessed/performed           Past Medical History:  Diagnosis Date  . Allergic rhinitis   . Arthritis   . B12 deficiency   . Cervicogenic headache   . Childhood asthma   . Chronic back pain   . Coronary atherosclerosis of native coronary artery    Mild nonobstructive CAD at catheterization January 2015  . Dementia ([place])   . Depression   . Essential hypertension   . Falls   . GERD (gastroesophageal reflux disease)   . Glaucoma   . History of blood transfusion   . History of cerebrovascular disease 07/23/2015  . History of kidney stones   . History of pneumonia 02/2011  . Hyperlipidemia   . OSA (obstructive sleep apnea)    CPAP - Dr. [name] - uses cpap every night  . Pneumonia   . Prostate cancer ([place])   . PTSD (post-traumatic stress disorder)    [place]  . Rectal bleeding   . Stroke ([place]) 03/2021  . Wheelchair bound     Past Surgical History:  Procedure Laterality Date  . APPLICATION OF ROBOTIC ASSISTANCE FOR SPINAL PROCEDURE N/A 03/28/2018   Procedure: APPLICATION OF ROBOTIC ASSISTANCE FOR SPINAL PROCEDURE;  Surgeon: Kristeen Miss, MD;  Location: [facility];  Service: Neurosurgery;   Laterality: N/A;  . BACK SURGERY  02/14/12   lumbar OR #7; "today redid L1L2; replaced screws; added bone from hip"  . BILATERAL KNEE ARTHROSCOPY    . COLONOSCOPY  10/15/2008   Dr. [name]: tubular adenoma   . COLONOSCOPY  12/17/2003   GUY:QIHKVQ rectal and colon  . COLONOSCOPY N/A 09/05/2014   Procedure: COLONOSCOPY;  Surgeon: Daneil Dolin, MD;  [facility];  Service: Endoscopy;  Laterality: N/A;  7:30-rescheduled 9/17 to [place] notified pt  . CYSTOSCOPY WITH STENT PLACEMENT Right 01/27/2016   Procedure: CYSTOSCOPY WITH STENT PLACEMENT;  Surgeon: [name], MD;  [facility];  Service: Urology;  Laterality: Right;  . CYSTOSCOPY/RETROGRADE/URETEROSCOPY/STONE EXTRACTION WITH BASKET Right 01/27/2016   Procedure: CYSTOSCOPY, RIGHT RETROGRADE, RIGHT URETEROSCOPY, STONE EXTRACTION ;  Surgeon: [name], MD;  [facility];  Service: Urology;  Laterality: Right;  . ESOPHAGOGASTRODUODENOSCOPY  10/15/2008     Dr Rourk:Schatzki's ring status post dilation and disruption via 20 F Maloney dilator/ otherwise unremarkable esophagus, small hiatal hernia, multiple fundal gland polyps not manipulated, gastritis, negative H.pylori  . ESOPHAGOGASTRODUODENOSCOPY  06/21/02   QVZ:DGLOV sliding hiatal hernia with mild changes of reflux esophagitis limited to gastroesophageal junction.  Noncritical ring at distal esophagus, 3 cm proximal to gastroesophageal  junction/Antral gastritis  . [place]   "broke face playing softball"  . FRACTURE SURGERY     "left wrist; broke it; took spur off"  . HOLMIUM LASER APPLICATION Right 08/26/6733   Procedure: HOLMIUM LASER APPLICATION;  Surgeon: [name], MD;  [facility];  Service: Urology;  Laterality: Right;  . KNEE ARTHROSCOPY Right 05/18/2018   Procedure: PARTIAL MEDIAL MENISECTOMY AND SURGICAL LAVAGE AND CHONDROPLASTY;  Surgeon: [name], MD;  Location: [facility];  Service: Orthopedics;  Laterality:  Right;  . LEFT HEART CATHETERIZATION WITH CORONARY ANGIOGRAM N/A 12/27/2013   Procedure: LEFT HEART CATHETERIZATION WITH CORONARY ANGIOGRAM;  Surgeon: [name], MD;  Location: [facility] CATH LAB;  Service: Cardiovascular;  Laterality: N/A;  . neck epidural    . POSTERIOR LUMBAR FUSION 4 LEVEL N/A 03/28/2018   Procedure: Thoracic eight -Lumbar two- FIXATION WITH SCREW PLACEMENT, DECOMPRESSION Thoracic ten-Thoracic eleven  FOR OSTEOMYELITIS;  Surgeon: Kristeen Miss, MD;  Location: [facility];  Service: Neurosurgery;  Laterality: N/A;  . SHOULDER SURGERY Bilateral   . SPINAL CORD STIMULATOR REMOVAL N/A 02/25/2020   Procedure: LUMBAR SPINAL CORD STIMULATOR REMOVAL;  Surgeon: Kristeen Miss, MD;  Location: [facility];  Service: Neurosurgery;  Laterality: N/A;  . TEE WITHOUT CARDIOVERSION N/A 03/21/2018   Procedure: TRANSESOPHAGEAL ECHOCARDIOGRAM (TEE) WITH PROPOFOL;  Surgeon: [name], MD;  [facility];  Service: Cardiovascular;  Laterality: N/A;    There were no vitals filed for this visit.   Subjective Assessment - 05/25/21 1403    Subjective Patient states his left side of his neck has been bothering him.    Currently in Pain? No/denies    Pain Onset 1 to 4 weeks ago                             [facility] Adult PT Treatment/Exercise - 05/25/21 0001      Ambulation/Gait   Ambulation/Gait Yes    Ambulation/Gait Assistance 4: Min guard    Ambulation Distance (Feet) 25 Feet    Assistive device Rolling walker    Gait Comments trunk flexed, heavy UE support on bars; 4x 25 feet with RW in parallel bars      Knee/Hip Exercises: Stretches   Other Knee/Hip Stretches levator scap stretch L 3x 30 seconds      Knee/Hip Exercises: Standing   Hip Flexion Both;1 set;10 reps    Other Standing Knee Exercises 4RT sidestep inside // bars                  PT Education - 05/25/21 1403    Education Details HEP    Person(s) Educated Patient    Methods Explanation     Comprehension Verbalized understanding            PT Short Term Goals - 05/05/21 1502      PT SHORT TERM GOAL #1   Title Patient will be independent in self management strategies to improve quality of life and functional outcomes.    Time 3    Period Weeks    Status New    Target Date 05/26/21      PT SHORT TERM GOAL #2   Title Patient will report at least 25% improvement in overall symptoms and/or functional ability.    Time 3    Period Weeks    Status New    Target Date 05/26/21  PT Long Term Goals - 05/05/21 1503      PT LONG TERM GOAL #1   Title Patient will report at least 50% improvement in overall symptoms and/or functional ability.    Time 6    Period Weeks    Status New    Target Date 06/16/21      PT LONG TERM GOAL #2   Title Patient will demonstrate ability to stand at a support surface for 10 minutes without a rest break in order to perform household chores and ADL's with increased ease.     Time 6    Period Weeks    Status New    Target Date 06/16/21      PT LONG TERM GOAL #3   Title Patient will demonstrate ability to ambulate 120 feet without rest break using LRAD with no more than minimal assistance.    Time 6    Period Weeks    Status New    Target Date 06/16/21      PT LONG TERM GOAL #4   Title Patient will be participating in exercise program at home or [employer], 40-60 minutes of moderate aerobic exercise in addition to therapy, to improve health/wellness in order to lose weight for improved mobility and function.    Time 6    Period Weeks    Status New    Target Date 06/16/21      PT LONG TERM GOAL #5   Title Patient will score at least 37 on Berg balance test in order to reduce the risk of falls.    Time 6    Period Weeks    Status New    Target Date 06/16/21                 Plan - 05/25/21 1402    Clinical Impression Statement Patient with tenderness in levator scapulae and tolerates stretch for it with decrease in  symptoms following. Patient continues to show impaired standing tolerance with LE fatigue requiring seated rest breaks. Patient ambulates increased distance today with use of RW. Patient will continue to benefit from skilled physical therapy in order to reduce impairment and improve function.    Personal Factors and Comorbidities Age;Fitness;Behavior Pattern;Past/Current Experience;Comorbidity 3+;Time since onset of injury/illness/exacerbation    Comorbidities CVA, lumbar and cervical fusions, paraplegia    Examination-Activity Limitations Locomotion Level;Transfers;Bed Mobility;Squat;Stairs;Stand;Lift;Hygiene/Grooming    Examination-Participation Restrictions Meal Prep;Cleaning;Community Activity;Volunteer;Shop;Yard Work;Laundry    Stability/Clinical Decision Making Stable/Uncomplicated    Rehab Potential Good    PT Frequency 2x / week    PT Duration 6 weeks    PT Treatment/Interventions ADLs/Self Care Home Management;Aquatic Therapy;Cryotherapy;Electrical Stimulation;Iontophoresis 4mg /ml Dexamethasone;Moist Heat;Traction;Ultrasound;DME Instruction;Gait training;Stair training;Functional mobility training;Therapeutic activities;Therapeutic exercise;Balance training;Neuromuscular re-education;Patient/family education;Manual techniques;Dry needling;Energy conservation;Vasopneumatic Device;Splinting;Taping;Vestibular;Spinal Manipulations;Joint Manipulations;Manual lymph drainage;Orthotic Fit/Training;Compression bandaging    PT Next Visit Plan 2MWT next session.  continue LE strengthening, gait, balance training    PT Home Exercise Plan marching, STS    Consulted and Agree with Plan of Care Patient           Patient will benefit from skilled therapeutic intervention in order to improve the following deficits and impairments:  Abnormal gait,Difficulty walking,Decreased endurance,Decreased activity tolerance,Pain,Decreased balance,Impaired flexibility,Improper body mechanics,Postural  dysfunction,Decreased strength,Decreased mobility  Visit Diagnosis: Muscle weakness (generalized)  Other abnormalities of gait and mobility     Problem List Patient Active Problem List   Diagnosis Date Noted  . Stroke ([place]) 04/29/2021  . Infection of spinal cord stimulator ([place]) 02/26/2020  . Pseudogout  of knee   . Pyogenic arthritis of right knee joint ([place])   . Falls frequently 05/08/2018  . Fall   . Sleep disturbance   . Post-op pain   . Hypokalemia   . Morbid obesity ([place])   . Postoperative pain   . DVT, lower extremity, distal, acute, bilateral ([place])   . Acute blood loss anemia   . Anemia of chronic disease   . Essential hypertension   . Bacteremia   . Myelopathy ([place]) 03/30/2018  . Paraplegia ([place])   . Postlaminectomy syndrome, lumbar region   . Epidural abscess   . Abdominal distension   . Encephalopathy   . Weakness of both lower extremities   . Discitis of thoracic region   . Spinal cord stimulator status   . Staphylococcus aureus sepsis ([place]) 03/20/2018  . Bacteremia due to methicillin susceptible Staphylococcus aureus (MSSA) 03/18/2018  . Obesity 03/18/2018  . Nephrolithiasis 03/16/2018  . AKI (acute kidney injury) ([place]) 03/16/2018  . Cognitive impairment 03/16/2018  . Chronic pain syndrome 03/16/2018  . Cervicogenic headache 01/28/2016  . B12 deficiency 01/28/2016  . Memory difficulty 07/23/2015  . History of cerebrovascular disease 07/23/2015  . FH: colon cancer 08/13/2014  . Hx of adenomatous colonic polyps 08/13/2014  . Chronic back pain 05/30/2012  . CAD, NATIVE VESSEL 07/17/2010  . HYPERCHOLESTEROLEMIA 10/31/2009  . SMOKELESS TOBACCO ABUSE 10/31/2009  . Obstructive sleep apnea 10/31/2009  . Essential hypertension, benign 10/31/2009  . GERD 10/31/2009   2:45 PM, 05/25/21 Mearl Latin PT, DPT Physical Therapist at [facility] [place], [place], [address] Phone: [phone]   Fax:  [phone]  Name: Cody Velez MRN: [id number] Date of Birth: 1946/04/16

## 2021-05-27 ENCOUNTER — Other Ambulatory Visit: Payer: Self-pay

## 2021-05-27 ENCOUNTER — Ambulatory Visit (HOSPITAL_COMMUNITY): Payer: Medicare Other | Admitting: Physical Therapy

## 2021-05-27 ENCOUNTER — Encounter (HOSPITAL_COMMUNITY): Payer: Self-pay | Admitting: Physical Therapy

## 2021-05-27 ENCOUNTER — Encounter (HOSPITAL_COMMUNITY): Payer: Self-pay

## 2021-05-27 NOTE — Therapy (Signed)
[facility] [place], [place], [address] Phone: [phone]   Fax:  [phone]  Patient Details  Name: Cody Velez MRN: [id number] Date of Birth: 1946-06-27 Referring Provider:  Asencion Noble, MD  Encounter Date: 05/27/2021   Patient arrived not feeling well today asking if PT could take BP. Patient BP taken at 130/90. Patient wishing to defer treatment today as he was not feeling well. Patient educated on follow up with PCP/ED if needed.   2:28 PM, 05/27/21 Mearl Latin PT, DPT Physical Therapist at [facility] [place], [place], [address] Phone: [phone]   Fax:  [phone]

## 2021-06-02 ENCOUNTER — Other Ambulatory Visit: Payer: Self-pay

## 2021-06-02 ENCOUNTER — Encounter (HOSPITAL_COMMUNITY): Payer: Self-pay | Admitting: Physical Therapy

## 2021-06-02 ENCOUNTER — Ambulatory Visit (HOSPITAL_COMMUNITY): Payer: Medicare Other | Admitting: Physical Therapy

## 2021-06-02 DIAGNOSIS — R2689 Other abnormalities of gait and mobility: Secondary | ICD-10-CM

## 2021-06-02 DIAGNOSIS — M6281 Muscle weakness (generalized): Secondary | ICD-10-CM | POA: Diagnosis not present

## 2021-06-02 DIAGNOSIS — R262 Difficulty in walking, not elsewhere classified: Secondary | ICD-10-CM | POA: Diagnosis not present

## 2021-06-02 NOTE — Therapy (Signed)
[facility] [address] [place], [place], [address] Phone: [phone]   Fax:  [phone]  Physical Therapy Treatment  Patient Details  Name: Cody Velez MRN: [id number] Date of Birth: 10-Jul-1946 Referring Provider (PT): Asencion Noble MD   Encounter Date: 06/02/2021   PT End of Session - 06/02/21 1404     Visit Number 6    Number of Visits 12    Date for PT Re-Evaluation 06/16/21    Authorization Type Primary Medicare Secondary  BCBS (75 VL, no auth required)    Progress Note Due on Visit 10    PT Start Time 1404    PT Stop Time 1443    PT Time Calculation (min) 39 min    Equipment Utilized During Treatment Gait belt    Activity Tolerance Patient tolerated treatment well;Patient limited by fatigue    Behavior During Therapy [facility] for tasks assessed/performed             Past Medical History:  Diagnosis Date   Allergic rhinitis    Arthritis    B12 deficiency    Cervicogenic headache    Childhood asthma    Chronic back pain    Coronary atherosclerosis of native coronary artery    Mild nonobstructive CAD at catheterization January 2015   Dementia [facility])    Depression    Essential hypertension    Falls    GERD (gastroesophageal reflux disease)    Glaucoma    History of blood transfusion    History of cerebrovascular disease 07/23/2015   History of kidney stones    History of pneumonia 02/2011   Hyperlipidemia    OSA (obstructive sleep apnea)    CPAP - Dr. [name] - uses cpap every night   Pneumonia    Prostate cancer ([place])    PTSD (post-traumatic stress disorder)    [place]   Rectal bleeding    Stroke ([place]) 03/2021   Wheelchair bound     Past Surgical History:  Procedure Laterality Date   APPLICATION OF ROBOTIC ASSISTANCE FOR SPINAL PROCEDURE N/A 03/28/2018   Procedure: APPLICATION OF ROBOTIC ASSISTANCE FOR SPINAL PROCEDURE;  Surgeon: Kristeen Miss, MD;  Location: [place];  Service: Neurosurgery;  Laterality: N/A;    BACK SURGERY  02/14/12   lumbar OR #7; "today redid L1L2; replaced screws; added bone from hip"   BILATERAL KNEE ARTHROSCOPY     COLONOSCOPY  10/15/2008   Dr. [name]: tubular adenoma    COLONOSCOPY  12/17/2003   VQQ:VZDGLO rectal and colon   COLONOSCOPY N/A 09/05/2014   Procedure: COLONOSCOPY;  Surgeon: Daneil Dolin, MD;  [facility];  Service: Endoscopy;  Laterality: N/A;  7:30-rescheduled 9/17 to [place] notified pt   CYSTOSCOPY WITH STENT PLACEMENT Right 01/27/2016   Procedure: CYSTOSCOPY WITH STENT PLACEMENT;  Surgeon: [name], MD;  Location: [facility];  Service: Urology;  Laterality: Right;   CYSTOSCOPY/RETROGRADE/URETEROSCOPY/STONE EXTRACTION WITH BASKET Right 01/27/2016   Procedure: CYSTOSCOPY, RIGHT RETROGRADE, RIGHT URETEROSCOPY, STONE EXTRACTION ;  Surgeon: [name], MD;  Location: [facility];  Service: Urology;  Laterality: Right;   ESOPHAGOGASTRODUODENOSCOPY  10/15/2008     Dr Rourk:Schatzki's ring status post dilation and disruption via 26 F Maloney dilator/ otherwise unremarkable esophagus, small hiatal hernia, multiple fundal gland polyps not manipulated, gastritis, negative H.pylori   ESOPHAGOGASTRODUODENOSCOPY  06/21/02   VFI:EPPIR sliding hiatal hernia with mild changes of reflux esophagitis limited to gastroesophageal junction.  Noncritical ring at distal esophagus, 3 cm  proximal to gastroesophageal junction/Antral gastritis   FACIAL COSMETIC SURGERY  ` 1985   "broke face playing softball"   FRACTURE SURGERY     "left wrist; broke it; took spur off"   HOLMIUM LASER APPLICATION Right 0/12/7492   Procedure: HOLMIUM LASER APPLICATION;  Surgeon: [name], MD;  Location: [facility];  Service: Urology;  Laterality: Right;   KNEE ARTHROSCOPY Right 05/18/2018   Procedure: PARTIAL MEDIAL MENISECTOMY AND SURGICAL LAVAGE AND CHONDROPLASTY;  Surgeon: [name], MD;  Location: [place];  Service: Orthopedics;  Laterality: Right;   LEFT HEART  CATHETERIZATION WITH CORONARY ANGIOGRAM N/A 12/27/2013   Procedure: LEFT HEART CATHETERIZATION WITH CORONARY ANGIOGRAM;  Surgeon: [name], MD;  Location: [facility] CATH LAB;  Service: Cardiovascular;  Laterality: N/A;   neck epidural     POSTERIOR LUMBAR FUSION 4 LEVEL N/A 03/28/2018   Procedure: Thoracic eight -Lumbar two- FIXATION WITH SCREW PLACEMENT, DECOMPRESSION Thoracic ten-Thoracic eleven  FOR OSTEOMYELITIS;  Surgeon: Kristeen Miss, MD;  Location: [place];  Service: Neurosurgery;  Laterality: N/A;   SHOULDER SURGERY Bilateral    SPINAL CORD STIMULATOR REMOVAL N/A 02/25/2020   Procedure: LUMBAR SPINAL CORD STIMULATOR REMOVAL;  Surgeon: Kristeen Miss, MD;  Location: [place];  Service: Neurosurgery;  Laterality: N/A;   TEE WITHOUT CARDIOVERSION N/A 03/21/2018   Procedure: TRANSESOPHAGEAL ECHOCARDIOGRAM (TEE) WITH PROPOFOL;  Surgeon: [name], MD;  [facility];  Service: Cardiovascular;  Laterality: N/A;    There were no vitals filed for this visit.   Subjective Assessment - 06/02/21 1405     Subjective Patient states nothing new, feels better than last time.    Currently in Pain? No/denies    Pain Onset 1 to 4 weeks ago                               [facility] Adult PT Treatment/Exercise - 06/02/21 0001       Ambulation/Gait   Ambulation/Gait Yes    Ambulation/Gait Assistance 4: Min guard    Ambulation Distance (Feet) 40 Feet    Assistive device Rolling walker    Gait Comments trunk flexed, heavy UE support on RWx 36 feet, RW; 2x 75 feet, 2x 125 feet                    PT Education - 06/02/21 1407     Education Details HeP    Person(s) Educated Patient    Methods Explanation;Demonstration    Comprehension Verbalized understanding;Returned demonstration              PT Short Term Goals - 05/05/21 1502       PT SHORT TERM GOAL #1   Title Patient will be independent in self management strategies to improve quality of life and  functional outcomes.    Time 3    Period Weeks    Status New    Target Date 05/26/21      PT SHORT TERM GOAL #2   Title Patient will report at least 25% improvement in overall symptoms and/or functional ability.    Time 3    Period Weeks    Status New    Target Date 05/26/21               PT Long Term Goals - 05/05/21 1503       PT LONG TERM GOAL #1   Title Patient will report at least 50% improvement in  overall symptoms and/or functional ability.    Time 6    Period Weeks    Status New    Target Date 06/16/21      PT LONG TERM GOAL #2   Title Patient will demonstrate ability to stand at a support surface for 10 minutes without a rest break in order to perform household chores and ADL's with increased ease.     Time 6    Period Weeks    Status New    Target Date 06/16/21      PT LONG TERM GOAL #3   Title Patient will demonstrate ability to ambulate 120 feet without rest break using LRAD with no more than minimal assistance.    Time 6    Period Weeks    Status New    Target Date 06/16/21      PT LONG TERM GOAL #4   Title Patient will be participating in exercise program at home or [employer], 40-60 minutes of moderate aerobic exercise in addition to therapy, to improve health/wellness in order to lose weight for improved mobility and function.    Time 6    Period Weeks    Status New    Target Date 06/16/21      PT LONG TERM GOAL #5   Title Patient will score at least 37 on Berg balance test in order to reduce the risk of falls.    Time 6    Period Weeks    Status New    Target Date 06/16/21                   Plan - 06/02/21 1405     Clinical Impression Statement Patient demonstrating improving ambulation tolerance and is able to ambulate increased distance today. He ambulates with heavy UE support on RW. Patient with intermittent unsteadiness with LE trembling with ambulation. Patient given cueing for rest breaks. Patient requires frequent seated rest  breaks.  Patient educated on walking more at home with RW and family present. Patient will continue to benefit from skilled physical therapy in order to reduce impairment and improve function.    Personal Factors and Comorbidities Age;Fitness;Behavior Pattern;Past/Current Experience;Comorbidity 3+;Time since onset of injury/illness/exacerbation    Comorbidities CVA, lumbar and cervical fusions, paraplegia    Examination-Activity Limitations Locomotion Level;Transfers;Bed Mobility;Squat;Stairs;Stand;Lift;Hygiene/Grooming    Examination-Participation Restrictions Meal Prep;Cleaning;Community Activity;Volunteer;Shop;Yard Work;Laundry    Stability/Clinical Decision Making Stable/Uncomplicated    Rehab Potential Good    PT Frequency 2x / week    PT Duration 6 weeks    PT Treatment/Interventions ADLs/Self Care Home Management;Aquatic Therapy;Cryotherapy;Electrical Stimulation;Iontophoresis 4mg /ml Dexamethasone;Moist Heat;Traction;Ultrasound;DME Instruction;Gait training;Stair training;Functional mobility training;Therapeutic activities;Therapeutic exercise;Balance training;Neuromuscular re-education;Patient/family education;Manual techniques;Dry needling;Energy conservation;Vasopneumatic Device;Splinting;Taping;Vestibular;Spinal Manipulations;Joint Manipulations;Manual lymph drainage;Orthotic Fit/Training;Compression bandaging    PT Next Visit Plan 2MWT next session.  continue LE strengthening, gait, balance training    PT Home Exercise Plan marching, STS 6/14 ambulation with RW    Consulted and Agree with Plan of Care Patient             Patient will benefit from skilled therapeutic intervention in order to improve the following deficits and impairments:  Abnormal gait, Difficulty walking, Decreased endurance, Decreased activity tolerance, Pain, Decreased balance, Impaired flexibility, Improper body mechanics, Postural dysfunction, Decreased strength, Decreased mobility  Visit Diagnosis: Muscle  weakness (generalized)  Other abnormalities of gait and mobility  Difficulty in walking, not elsewhere classified     Problem List Patient Active Problem List   Diagnosis Date Noted   Stroke (  [place]) 04/29/2021   Infection of spinal cord stimulator ([place]) 02/26/2020   Pseudogout of knee    Pyogenic arthritis of right knee joint ([place])    Falls frequently 05/08/2018   Fall    Sleep disturbance    Post-op pain    Hypokalemia    Morbid obesity ([place])    Postoperative pain    DVT, lower extremity, distal, acute, bilateral ([place])    Acute blood loss anemia    Anemia of chronic disease    Essential hypertension    Bacteremia    Myelopathy ([place]) 03/30/2018   Paraplegia ([place])    Postlaminectomy syndrome, lumbar region    Epidural abscess    Abdominal distension    Encephalopathy    Weakness of both lower extremities    Discitis of thoracic region    Spinal cord stimulator status    Staphylococcus aureus sepsis ([place]) 03/20/2018   Bacteremia due to methicillin susceptible Staphylococcus aureus (MSSA) 03/18/2018   Obesity 03/18/2018   Nephrolithiasis 03/16/2018   AKI (acute kidney injury) ([place]) 03/16/2018   Cognitive impairment 03/16/2018   Chronic pain syndrome 03/16/2018   Cervicogenic headache 01/28/2016   B12 deficiency 01/28/2016   Memory difficulty 07/23/2015   History of cerebrovascular disease 07/23/2015   FH: colon cancer 08/13/2014   Hx of adenomatous colonic polyps 08/13/2014   Chronic back pain 05/30/2012   CAD, NATIVE VESSEL 07/17/2010   HYPERCHOLESTEROLEMIA 10/31/2009   SMOKELESS TOBACCO ABUSE 10/31/2009   Obstructive sleep apnea 10/31/2009   Essential hypertension, benign 10/31/2009   GERD 10/31/2009   2:43 PM, 06/02/21 Mearl Latin PT, DPT Physical Therapist at [facility] [place], [place], [address] Phone: [phone]   Fax:  [phone]  Name:  TASHEEM ELMS MRN: [id number] Date of Birth: 1946/10/25

## 2021-06-04 ENCOUNTER — Other Ambulatory Visit: Payer: Self-pay

## 2021-06-04 ENCOUNTER — Encounter (HOSPITAL_COMMUNITY): Payer: Self-pay | Admitting: Physical Therapy

## 2021-06-04 ENCOUNTER — Ambulatory Visit (HOSPITAL_COMMUNITY): Payer: Medicare Other | Admitting: Physical Therapy

## 2021-06-04 DIAGNOSIS — R262 Difficulty in walking, not elsewhere classified: Secondary | ICD-10-CM

## 2021-06-04 DIAGNOSIS — R2689 Other abnormalities of gait and mobility: Secondary | ICD-10-CM

## 2021-06-04 DIAGNOSIS — M6281 Muscle weakness (generalized): Secondary | ICD-10-CM

## 2021-06-04 NOTE — Therapy (Signed)
[place] Vele[place], [place], [address] Phone: [phone]   Fax:  [phone]  Physical Therapy Treatment  Patient Details  Name: Cody Velez MRN: [id number] Date of Birth: [birth year] Referring Provider (PT): Asencion Noble MD   Encounter Date: 06/04/2021   PT End of Session - 06/04/21 1408     Visit Number 7    Number of Visits 12    Date for PT Re-Evaluation 06/16/21    Authorization Type Primary Medicare Secondary  BCBS (75 VL, no auth required)    Progress Note Due on Visit 10    PT Start Time 1407    PT Stop Time 1445    PT Time Calculation (min) 38 min    Equipment Utilized During Treatment Gait belt    Activity Tolerance Patient tolerated treatment well;Patient limited by fatigue    Behavior During Therapy [facility] for tasks assessed/performed             Past Medical History:  Diagnosis Date   Allergic rhinitis    Arthritis    B12 deficiency    Cervicogenic headache    Childhood asthma    Chronic back pain    Coronary atherosclerosis of native coronary artery    Mild nonobstructive CAD at catheterization January 2015   Dementia [facility])    Depression    Essential hypertension    Falls    GERD (gastroesophageal reflux disease)    Glaucoma    History of blood transfusion    History of cerebrovascular disease 07/23/2015   History of kidney stones    History of pneumonia 02/2011   Hyperlipidemia    OSA (obstructive sleep apnea)    CPAP - Dr. [name] - uses cpap every night   Pneumonia    Prostate cancer ([place])    PTSD (post-traumatic stress disorder)    [place]   Rectal bleeding    Stroke ([place]) 03/2021   Wheelchair bound     Past Surgical History:  Procedure Laterality Date   APPLICATION OF ROBOTIC ASSISTANCE FOR SPINAL PROCEDURE N/A 03/28/2018   Procedure: APPLICATION OF ROBOTIC ASSISTANCE FOR SPINAL PROCEDURE;  Surgeon: Kristeen Miss, MD;  Location: [place];  Service: Neurosurgery;  Laterality: N/A;    BACK SURGERY  02/14/12   lumbar OR #7; "today redid L1L2; replaced screws; added bone from hip"   BILATERAL KNEE ARTHROSCOPY     COLONOSCOPY  10/15/2008   Dr. [name]: tubular adenoma    COLONOSCOPY  12/17/2003   CZY:SAYTKZ rectal and colon   COLONOSCOPY N/A 09/05/2014   Procedure: COLONOSCOPY;  Surgeon: Daneil Dolin, MD;  [facility];  Service: Endoscopy;  Laterality: N/A;  7:30-rescheduled 9/17 to [place] notified pt   CYSTOSCOPY WITH STENT PLACEMENT Right 01/27/2016   Procedure: CYSTOSCOPY WITH STENT PLACEMENT;  Surgeon: [name], MD;  Location: [facility];  Service: Urology;  Laterality: Right;   CYSTOSCOPY/RETROGRADE/URETEROSCOPY/STONE EXTRACTION WITH BASKET Right 01/27/2016   Procedure: CYSTOSCOPY, RIGHT RETROGRADE, RIGHT URETEROSCOPY, STONE EXTRACTION ;  Surgeon: [name], MD;  Location: [facility];  Service: Urology;  Laterality: Right;   ESOPHAGOGASTRODUODENOSCOPY  10/15/2008     Dr Rourk:Schatzki's ring status post dilation and disruption via 10 F Maloney dilator/ otherwise unremarkable esophagus, small hiatal hernia, multiple fundal gland polyps not manipulated, gastritis, negative H.pylori   ESOPHAGOGASTRODUODENOSCOPY  06/21/02   SWF:UXNAT sliding hiatal hernia with mild changes of reflux esophagitis limited to gastroesophageal junction.  Noncritical ring at distal esophagus, 3 cm  proximal to gastroesophageal junction/Antral gastritis   FACIAL COSMETIC SURGERY  ` 1985   "broke face playing softball"   FRACTURE SURGERY     "left wrist; broke it; took spur off"   HOLMIUM LASER APPLICATION Right 05/28/6294   Procedure: HOLMIUM LASER APPLICATION;  Surgeon: [name], MD;  Location: [facility];  Service: Urology;  Laterality: Right;   KNEE ARTHROSCOPY Right 05/18/2018   Procedure: PARTIAL MEDIAL MENISECTOMY AND SURGICAL LAVAGE AND CHONDROPLASTY;  Surgeon: [name], MD;  Location: [place];  Service: Orthopedics;  Laterality: Right;   LEFT HEART  CATHETERIZATION WITH CORONARY ANGIOGRAM N/A 12/27/2013   Procedure: LEFT HEART CATHETERIZATION WITH CORONARY ANGIOGRAM;  Surgeon: [name], MD;  Location: [facility] CATH LAB;  Service: Cardiovascular;  Laterality: N/A;   neck epidural     POSTERIOR LUMBAR FUSION 4 LEVEL N/A 03/28/2018   Procedure: Thoracic eight -Lumbar two- FIXATION WITH SCREW PLACEMENT, DECOMPRESSION Thoracic ten-Thoracic eleven  FOR OSTEOMYELITIS;  Surgeon: Kristeen Miss, MD;  Location: [place];  Service: Neurosurgery;  Laterality: N/A;   SHOULDER SURGERY Bilateral    SPINAL CORD STIMULATOR REMOVAL N/A 02/25/2020   Procedure: LUMBAR SPINAL CORD STIMULATOR REMOVAL;  Surgeon: Kristeen Miss, MD;  Location: [place];  Service: Neurosurgery;  Laterality: N/A;   TEE WITHOUT CARDIOVERSION N/A 03/21/2018   Procedure: TRANSESOPHAGEAL ECHOCARDIOGRAM (TEE) WITH PROPOFOL;  Surgeon: [name], MD;  [facility];  Service: Cardiovascular;  Laterality: N/A;    There were no vitals filed for this visit.   Subjective Assessment - 06/04/21 1408     Subjective Patient feeling good today. Didnt get the chance to walk today    Currently in Pain? No/denies    Pain Onset 1 to 4 weeks ago                               [facility] Adult PT Treatment/Exercise - 06/04/21 0001       Ambulation/Gait   Ambulation/Gait Yes    Ambulation/Gait Assistance 4: Min guard    Ambulation Distance (Feet) 675 Feet    Assistive device Rolling walker    Gait Comments 4x 150-175 feet, 1x 226                    PT Education - 06/04/21 1411     Education Details HEP, walking at home    Person(s) Educated Patient    Methods Explanation;Demonstration    Comprehension Verbalized understanding;Returned demonstration              PT Short Term Goals - 05/05/21 1502       PT SHORT TERM GOAL #1   Title Patient will be independent in self management strategies to improve quality of life and functional outcomes.    Time  3    Period Weeks    Status New    Target Date 05/26/21      PT SHORT TERM GOAL #2   Title Patient will report at least 25% improvement in overall symptoms and/or functional ability.    Time 3    Period Weeks    Status New    Target Date 05/26/21               PT Long Term Goals - 05/05/21 1503       PT LONG TERM GOAL #1   Title Patient will report at least 50% improvement in overall symptoms and/or functional ability.  Time 6    Period Weeks    Status New    Target Date 06/16/21      PT LONG TERM GOAL #2   Title Patient will demonstrate ability to stand at a support surface for 10 minutes without a rest break in order to perform household chores and ADL's with increased ease.     Time 6    Period Weeks    Status New    Target Date 06/16/21      PT LONG TERM GOAL #3   Title Patient will demonstrate ability to ambulate 120 feet without rest break using LRAD with no more than minimal assistance.    Time 6    Period Weeks    Status New    Target Date 06/16/21      PT LONG TERM GOAL #4   Title Patient will be participating in exercise program at home or [employer], 40-60 minutes of moderate aerobic exercise in addition to therapy, to improve health/wellness in order to lose weight for improved mobility and function.    Time 6    Period Weeks    Status New    Target Date 06/16/21      PT LONG TERM GOAL #5   Title Patient will score at least 37 on Berg balance test in order to reduce the risk of falls.    Time 6    Period Weeks    Status New    Target Date 06/16/21                   Plan - 06/04/21 1408     Clinical Impression Statement Patient ambulates increased distance today with intermittent cueing for staying within RW. Patient requires guard for safety and balance and wheelchair follow for rest breaks with fatigue. Patient requires frequent seated rest breaks. Patient educated on importance of completing ambulation at home.  Patient will continue to  benefit from skilled physical therapy in order to reduce impairment and improve function.    Personal Factors and Comorbidities Age;Fitness;Behavior Pattern;Past/Current Experience;Comorbidity 3+;Time since onset of injury/illness/exacerbation    Comorbidities CVA, lumbar and cervical fusions, paraplegia    Examination-Activity Limitations Locomotion Level;Transfers;Bed Mobility;Squat;Stairs;Stand;Lift;Hygiene/Grooming    Examination-Participation Restrictions Meal Prep;Cleaning;Community Activity;Volunteer;Shop;Yard Work;Laundry    Stability/Clinical Decision Making Stable/Uncomplicated    Rehab Potential Good    PT Frequency 2x / week    PT Duration 6 weeks    PT Treatment/Interventions ADLs/Self Care Home Management;Aquatic Therapy;Cryotherapy;Electrical Stimulation;Iontophoresis 4mg /ml Dexamethasone;Moist Heat;Traction;Ultrasound;DME Instruction;Gait training;Stair training;Functional mobility training;Therapeutic activities;Therapeutic exercise;Balance training;Neuromuscular re-education;Patient/family education;Cody techniques;Dry needling;Energy conservation;Vasopneumatic Device;Splinting;Taping;Vestibular;Spinal Manipulations;Joint Manipulations;Cody lymph drainage;Orthotic Fit/Training;Compression bandaging    PT Next Visit Plan 2MWT next session.  continue LE strengthening, gait, balance training    PT Home Exercise Plan marching, STS 6/14 ambulation with RW    Consulted and Agree with Plan of Care Patient             Patient will benefit from skilled therapeutic intervention in order to improve the following deficits and impairments:  Abnormal gait, Difficulty walking, Decreased endurance, Decreased activity tolerance, Pain, Decreased balance, Impaired flexibility, Improper body mechanics, Postural dysfunction, Decreased strength, Decreased mobility  Visit Diagnosis: Muscle weakness (generalized)  Other abnormalities of gait and mobility  Difficulty in walking, not elsewhere  classified     Problem List Patient Active Problem List   Diagnosis Date Noted   Stroke ([place]) 04/29/2021   Infection of spinal cord stimulator ([place]) 02/26/2020   Pseudogout of knee    Pyogenic  arthritis of right knee joint ([place])    Falls frequently 05/08/2018   Fall    Sleep disturbance    Post-op pain    Hypokalemia    Morbid obesity ([place])    Postoperative pain    DVT, lower extremity, distal, acute, bilateral ([place])    Acute blood loss anemia    Anemia of chronic disease    Essential hypertension    Bacteremia    Myelopathy ([place]) 03/30/2018   Paraplegia ([facility])    Postlaminectomy syndrome, lumbar region    Epidural abscess    Abdominal distension    Encephalopathy    Weakness of both lower extremities    Discitis of thoracic region    Spinal cord stimulator status    Staphylococcus aureus sepsis ([place]) 03/20/2018   Bacteremia due to methicillin susceptible Staphylococcus aureus (MSSA) 03/18/2018   Obesity 03/18/2018   Nephrolithiasis 03/16/2018   AKI (acute kidney injury) ([place]) 03/16/2018   Cognitive impairment 03/16/2018   Chronic pain syndrome 03/16/2018   Cervicogenic headache 01/28/2016   B12 deficiency 01/28/2016   Memory difficulty 07/23/2015   History of cerebrovascular disease 07/23/2015   FH: colon cancer 08/13/2014   Hx of adenomatous colonic polyps 08/13/2014   Chronic back pain 05/30/2012   CAD, NATIVE VESSEL 07/17/2010   HYPERCHOLESTEROLEMIA 10/31/2009   SMOKELESS TOBACCO ABUSE 10/31/2009   Obstructive sleep apnea 10/31/2009   Essential hypertension, benign 10/31/2009   GERD 10/31/2009    2:44 PM, 06/04/21 Mearl Latin PT, DPT Physical Therapist at [facility] [place], [place], [address] Phone: [phone]   Fax:  [phone]  Name: Cody Velez MRN: [id number] Date of Birth: 03-03-1946

## 2021-06-09 ENCOUNTER — Ambulatory Visit (HOSPITAL_COMMUNITY): Payer: Medicare Other

## 2021-06-09 ENCOUNTER — Other Ambulatory Visit: Payer: Self-pay

## 2021-06-09 ENCOUNTER — Encounter (HOSPITAL_COMMUNITY): Payer: Self-pay

## 2021-06-09 DIAGNOSIS — R262 Difficulty in walking, not elsewhere classified: Secondary | ICD-10-CM | POA: Diagnosis not present

## 2021-06-09 DIAGNOSIS — M6281 Muscle weakness (generalized): Secondary | ICD-10-CM | POA: Diagnosis not present

## 2021-06-09 DIAGNOSIS — R2689 Other abnormalities of gait and mobility: Secondary | ICD-10-CM | POA: Diagnosis not present

## 2021-06-09 NOTE — Therapy (Signed)
[place], [place], [address] Phone: [phone]   Fax:  [phone]  Physical Therapy Treatment  Patient Details  Name: COSTA JHA MRN: [id number] Date of Birth: November 14, 1946 Referring Provider (PT): Asencion Noble MD   Encounter Date: 06/09/2021   PT End of Session - 06/09/21 1401     Visit Number 8    Number of Visits 12    Date for PT Re-Evaluation 06/16/21    Authorization Type Primary Medicare Secondary  BCBS (75 VL, no auth required)    Progress Note Due on Visit 10    PT Start Time 1521    PT Stop Time 1559    PT Time Calculation (min) 38 min    Equipment Utilized During Treatment Gait belt    Activity Tolerance Patient tolerated treatment well;Patient limited by fatigue    Behavior During Therapy [facility] for tasks assessed/performed             Past Medical History:  Diagnosis Date   Allergic rhinitis    Arthritis    B12 deficiency    Cervicogenic headache    Childhood asthma    Chronic back pain    Coronary atherosclerosis of native coronary artery    Mild nonobstructive CAD at catheterization January 2015   Dementia [facility])    Depression    Essential hypertension    Falls    GERD (gastroesophageal reflux disease)    Glaucoma    History of blood transfusion    History of cerebrovascular disease 07/23/2015   History of kidney stones    History of pneumonia 02/2011   Hyperlipidemia    OSA (obstructive sleep apnea)    CPAP - Dr. [name] - uses cpap every night   Pneumonia    Prostate cancer ([place])    PTSD (post-traumatic stress disorder)    [place]   Rectal bleeding    Stroke ([place]) 03/2021   Wheelchair bound     Past Surgical History:  Procedure Laterality Date   APPLICATION OF ROBOTIC ASSISTANCE FOR SPINAL PROCEDURE N/A 03/28/2018   Procedure: APPLICATION OF ROBOTIC ASSISTANCE FOR SPINAL PROCEDURE;  Surgeon: Kristeen Miss, MD;  Location: [place];  Service: Neurosurgery;  Laterality: N/A;    BACK SURGERY  02/14/12   lumbar OR #7; "today redid L1L2; replaced screws; added bone from hip"   BILATERAL KNEE ARTHROSCOPY     COLONOSCOPY  10/15/2008   Dr. [name]: tubular adenoma    COLONOSCOPY  12/17/2003   CVE:LFYBOF rectal and colon   COLONOSCOPY N/A 09/05/2014   Procedure: COLONOSCOPY;  Surgeon: Daneil Dolin, MD;  [facility];  Service: Endoscopy;  Laterality: N/A;  7:30-rescheduled 9/17 to [place] notified pt   CYSTOSCOPY WITH STENT PLACEMENT Right 01/27/2016   Procedure: CYSTOSCOPY WITH STENT PLACEMENT;  Surgeon: [name], MD;  Location: [facility];  Service: Urology;  Laterality: Right;   CYSTOSCOPY/RETROGRADE/URETEROSCOPY/STONE EXTRACTION WITH BASKET Right 01/27/2016   Procedure: CYSTOSCOPY, RIGHT RETROGRADE, RIGHT URETEROSCOPY, STONE EXTRACTION ;  Surgeon: [name], MD;  Location: [facility];  Service: Urology;  Laterality: Right;   ESOPHAGOGASTRODUODENOSCOPY  10/15/2008     Dr Rourk:Schatzki's ring status post dilation and disruption via 31 F Maloney dilator/ otherwise unremarkable esophagus, small hiatal hernia, multiple fundal gland polyps not manipulated, gastritis, negative H.pylori   ESOPHAGOGASTRODUODENOSCOPY  06/21/02   BPZ:WCHEN sliding hiatal hernia with mild changes of reflux esophagitis limited to gastroesophageal junction.  Noncritical ring at distal esophagus, 3 cm  proximal to gastroesophageal junction/Antral gastritis   FACIAL COSMETIC SURGERY  ` 1985   "broke face playing softball"   FRACTURE SURGERY     "left wrist; broke it; took spur off"   HOLMIUM LASER APPLICATION Right 07/21/4234   Procedure: HOLMIUM LASER APPLICATION;  Surgeon: [name], MD;  Location: [facility];  Service: Urology;  Laterality: Right;   KNEE ARTHROSCOPY Right 05/18/2018   Procedure: PARTIAL MEDIAL MENISECTOMY AND SURGICAL LAVAGE AND CHONDROPLASTY;  Surgeon: [name], MD;  Location: [place];  Service: Orthopedics;  Laterality: Right;   LEFT HEART  CATHETERIZATION WITH CORONARY ANGIOGRAM N/A 12/27/2013   Procedure: LEFT HEART CATHETERIZATION WITH CORONARY ANGIOGRAM;  Surgeon: [name], MD;  Location: [facility] CATH LAB;  Service: Cardiovascular;  Laterality: N/A;   neck epidural     POSTERIOR LUMBAR FUSION 4 LEVEL N/A 03/28/2018   Procedure: Thoracic eight -Lumbar two- FIXATION WITH SCREW PLACEMENT, DECOMPRESSION Thoracic ten-Thoracic eleven  FOR OSTEOMYELITIS;  Surgeon: Kristeen Miss, MD;  Location: [place];  Service: Neurosurgery;  Laterality: N/A;   SHOULDER SURGERY Bilateral    SPINAL CORD STIMULATOR REMOVAL N/A 02/25/2020   Procedure: LUMBAR SPINAL CORD STIMULATOR REMOVAL;  Surgeon: Kristeen Miss, MD;  Location: [place];  Service: Neurosurgery;  Laterality: N/A;   TEE WITHOUT CARDIOVERSION N/A 03/21/2018   Procedure: TRANSESOPHAGEAL ECHOCARDIOGRAM (TEE) WITH PROPOFOL;  Surgeon: [name], MD;  [facility];  Service: Cardiovascular;  Laterality: N/A;    There were no vitals filed for this visit.   Subjective Assessment - 06/09/21 1336     Subjective Feeling good today, no reports of pain.  Admits to not walking at home on regular basis    Pertinent History Multiple lumbar and cervical fusions, paraplegic, CVA April 03, 2021    Currently in Pain? No/denies                               [facility] Adult PT Treatment/Exercise - 06/09/21 0001       Ambulation/Gait   Ambulation/Gait Yes    Ambulation/Gait Assistance 4: Min guard    Ambulation Distance (Feet) 110 Feet    Assistive device Rolling walker    Gait Comments 2MWT; trunk flexed, heavy UE suport   1'33" prior rest break need; 2 sets of 2MWT                Balance Exercises - 06/09/21 0001       Balance Exercises: Standing   Standing Eyes Opened Narrow base of support (BOS);2 reps   1' intermittent HHA   Partial Tandem Stance 2 reps;30 secs    Sidestepping 2 reps   inside // bars 1 min prior need to rest   Marching --   toe tapping  6in step height 2x 1 min                PT Short Term Goals - 05/05/21 1502       PT SHORT TERM GOAL #1   Title Patient will be independent in self management strategies to improve quality of life and functional outcomes.    Time 3    Period Weeks    Status New    Target Date 05/26/21      PT SHORT TERM GOAL #2   Title Patient will report at least 25% improvement in overall symptoms and/or functional ability.    Time 3    Period Weeks  Status New    Target Date 05/26/21               PT Long Term Goals - 05/05/21 1503       PT LONG TERM GOAL #1   Title Patient will report at least 50% improvement in overall symptoms and/or functional ability.    Time 6    Period Weeks    Status New    Target Date 06/16/21      PT LONG TERM GOAL #2   Title Patient will demonstrate ability to stand at a support surface for 10 minutes without a rest break in order to perform household chores and ADL's with increased ease.     Time 6    Period Weeks    Status New    Target Date 06/16/21      PT LONG TERM GOAL #3   Title Patient will demonstrate ability to ambulate 120 feet without rest break using LRAD with no more than minimal assistance.    Time 6    Period Weeks    Status New    Target Date 06/16/21      PT LONG TERM GOAL #4   Title Patient will be participating in exercise program at home or [employer], 40-60 minutes of moderate aerobic exercise in addition to therapy, to improve health/wellness in order to lose weight for improved mobility and function.    Time 6    Period Weeks    Status New    Target Date 06/16/21      PT LONG TERM GOAL #5   Title Patient will score at least 37 on Berg balance test in order to reduce the risk of falls.    Time 6    Period Weeks    Status New    Target Date 06/16/21                   Plan - 06/09/21 1904     Clinical Impression Statement Began session wiht 2MWT, pt able to ambulate 1'33" prior need to rest.  Heart to  heart during session informing pt. that he will not make gains only walking during therapist sessions, strongly encouraged to complete walking daily and continue with exercises as well.  Pt required frequent seated rest breaks through session.  Cueing to improve posture to assist with balance and also utilize correct gluteal mm for maximal benefits.  No reports of pain, was limited by fatigue.    Personal Factors and Comorbidities Age;Fitness;Behavior Pattern;Past/Current Experience;Comorbidity 3+;Time since onset of injury/illness/exacerbation    Comorbidities CVA, lumbar and cervical fusions, paraplegia    Examination-Activity Limitations Locomotion Level;Transfers;Bed Mobility;Squat;Stairs;Stand;Lift;Hygiene/Grooming    Examination-Participation Restrictions Meal Prep;Cleaning;Community Activity;Volunteer;Shop;Yard Work;Laundry    Stability/Clinical Decision Making Stable/Uncomplicated    Clinical Decision Making Low    Rehab Potential Good    PT Frequency 2x / week    PT Duration 6 weeks    PT Treatment/Interventions ADLs/Self Care Home Management;Aquatic Therapy;Cryotherapy;Electrical Stimulation;Iontophoresis 4mg /ml Dexamethasone;Moist Heat;Traction;Ultrasound;DME Instruction;Gait training;Stair training;Functional mobility training;Therapeutic activities;Therapeutic exercise;Balance training;Neuromuscular re-education;Patient/family education;Manual techniques;Dry needling;Energy conservation;Vasopneumatic Device;Splinting;Taping;Vestibular;Spinal Manipulations;Joint Manipulations;Manual lymph drainage;Orthotic Fit/Training;Compression bandaging    PT Next Visit Plan 2MWT next session.  continue LE strengthening, gait, balance training             Patient will benefit from skilled therapeutic intervention in order to improve the following deficits and impairments:  Abnormal gait, Difficulty walking, Decreased endurance, Decreased activity tolerance, Pain, Decreased balance, Impaired  flexibility, Improper body mechanics, Postural dysfunction,  Decreased strength, Decreased mobility  Visit Diagnosis: Muscle weakness (generalized)  Other abnormalities of gait and mobility     Problem List Patient Active Problem List   Diagnosis Date Noted   Stroke ([place]) 04/29/2021   Infection of spinal cord stimulator ([place]) 02/26/2020   Pseudogout of knee    Pyogenic arthritis of right knee joint ([place])    Falls frequently 05/08/2018   Fall    Sleep disturbance    Post-op pain    Hypokalemia    Morbid obesity ([place])    Postoperative pain    DVT, lower extremity, distal, acute, bilateral ([place])    Acute blood loss anemia    Anemia of chronic disease    Essential hypertension    Bacteremia    Myelopathy ([place]) 03/30/2018   Paraplegia ([place])    Postlaminectomy syndrome, lumbar region    Epidural abscess    Abdominal distension    Encephalopathy    Weakness of both lower extremities    Discitis of thoracic region    Spinal cord stimulator status    Staphylococcus aureus sepsis ([place]) 03/20/2018   Bacteremia due to methicillin susceptible Staphylococcus aureus (MSSA) 03/18/2018   Obesity 03/18/2018   Nephrolithiasis 03/16/2018   AKI (acute kidney injury) ([place]) 03/16/2018   Cognitive impairment 03/16/2018   Chronic pain syndrome 03/16/2018   Cervicogenic headache 01/28/2016   B12 deficiency 01/28/2016   Memory difficulty 07/23/2015   History of cerebrovascular disease 07/23/2015   FH: colon cancer 08/13/2014   Hx of adenomatous colonic polyps 08/13/2014   Chronic back pain 05/30/2012   CAD, NATIVE VESSEL 07/17/2010   HYPERCHOLESTEROLEMIA 10/31/2009   SMOKELESS TOBACCO ABUSE 10/31/2009   Obstructive sleep apnea 10/31/2009   Essential hypertension, benign 10/31/2009   GERD 10/31/2009   Ihor Austin, LPTA/CLT; CBIS [phone]  Aldona Lento 06/09/2021, 7:10 PM  [facility] [address] [place], [place],  [address] Phone: [phone]   Fax:  [phone]  Name: GEORDIE NOONEY MRN: [id number] Date of Birth: Sep 30, 1946

## 2021-06-11 ENCOUNTER — Encounter (HOSPITAL_COMMUNITY): Payer: Self-pay | Admitting: Physical Therapy

## 2021-06-11 ENCOUNTER — Other Ambulatory Visit: Payer: Self-pay

## 2021-06-11 ENCOUNTER — Ambulatory Visit (HOSPITAL_COMMUNITY): Payer: Medicare Other | Admitting: Physical Therapy

## 2021-06-11 DIAGNOSIS — R262 Difficulty in walking, not elsewhere classified: Secondary | ICD-10-CM | POA: Diagnosis not present

## 2021-06-11 DIAGNOSIS — R2689 Other abnormalities of gait and mobility: Secondary | ICD-10-CM

## 2021-06-11 DIAGNOSIS — M6281 Muscle weakness (generalized): Secondary | ICD-10-CM

## 2021-06-11 NOTE — Therapy (Signed)
[place], [place], [address] Phone: [phone]   Fax:  [phone]  Physical Therapy Treatment/Discharge Summary  Patient Details  Name: Cody Velez MRN: [id number] Date of Birth: 10-15-1946 Referring Provider (PT): Asencion Noble MD   Encounter Date: 06/11/2021  PHYSICAL THERAPY DISCHARGE SUMMARY  Visits from Start of Care: 9  Current functional level related to goals / functional outcomes: See below   Remaining deficits: See below   Education / Equipment: See below   Patient agrees to discharge. Patient goals were partially met. Patient is being discharged due to maximized rehab potential.     PT End of Session - 06/11/21 1315     Visit Number 9    Number of Visits 12    Date for PT Re-Evaluation 06/16/21    Authorization Type Primary Medicare Secondary  BCBS (75 VL, no auth required)    Progress Note Due on Visit 10    PT Start Time 1315    PT Stop Time 1345    PT Time Calculation (min) 30 min    Equipment Utilized During Treatment Gait belt    Activity Tolerance Patient tolerated treatment well;Patient limited by fatigue    Behavior During Therapy WFL for tasks assessed/performed             Past Medical History:  Diagnosis Date   Allergic rhinitis    Arthritis    B12 deficiency    Cervicogenic headache    Childhood asthma    Chronic back pain    Coronary atherosclerosis of native coronary artery    Mild nonobstructive CAD at catheterization January 2015   Dementia [facility])    Depression    Essential hypertension    Falls    GERD (gastroesophageal reflux disease)    Glaucoma    History of blood transfusion    History of cerebrovascular disease 07/23/2015   History of kidney stones    History of pneumonia 02/2011   Hyperlipidemia    OSA (obstructive sleep apnea)    CPAP - Dr. [name] - uses cpap every night   Pneumonia    Prostate cancer ([place])    PTSD (post-traumatic stress  disorder)    [place]   Rectal bleeding    Stroke ([place]) 03/2021   Wheelchair bound     Past Surgical History:  Procedure Laterality Date   APPLICATION OF ROBOTIC ASSISTANCE FOR SPINAL PROCEDURE N/A 03/28/2018   Procedure: APPLICATION OF ROBOTIC ASSISTANCE FOR SPINAL PROCEDURE;  Surgeon: Kristeen Miss, MD;  Location: [place];  Service: Neurosurgery;  Laterality: N/A;   BACK SURGERY  02/14/12   lumbar OR #7; "today redid L1L2; replaced screws; added bone from hip"   BILATERAL KNEE ARTHROSCOPY     COLONOSCOPY  10/15/2008   Dr. [name]: tubular adenoma    COLONOSCOPY  12/17/2003   CZY:SAYTKZ rectal and colon   COLONOSCOPY N/A 09/05/2014   Procedure: COLONOSCOPY;  Surgeon: Daneil Dolin, MD;  [facility];  Service: Endoscopy;  Laterality: N/A;  7:30-rescheduled 9/17 to [place] notified pt   CYSTOSCOPY WITH STENT PLACEMENT Right 01/27/2016   Procedure: CYSTOSCOPY WITH STENT PLACEMENT;  Surgeon: [name], MD;  Location: [facility];  Service: Urology;  Laterality: Right;   CYSTOSCOPY/RETROGRADE/URETEROSCOPY/STONE EXTRACTION WITH BASKET Right 01/27/2016   Procedure: CYSTOSCOPY, RIGHT RETROGRADE, RIGHT URETEROSCOPY, STONE EXTRACTION ;  Surgeon: [name], MD;  Location: [facility];  Service: Urology;  Laterality: Right;   ESOPHAGOGASTRODUODENOSCOPY  10/15/2008  Dr Rourk:Schatzki's ring status post dilation and disruption via 45 F Maloney dilator/ otherwise unremarkable esophagus, small hiatal hernia, multiple fundal gland polyps not manipulated, gastritis, negative H.pylori   ESOPHAGOGASTRODUODENOSCOPY  06/21/02   XYI:AXKPV sliding hiatal hernia with mild changes of reflux esophagitis limited to gastroesophageal junction.  Noncritical ring at distal esophagus, 3 cm proximal to gastroesophageal junction/Antral gastritis   FACIAL COSMETIC SURGERY  ` 1985   "broke face playing softball"   FRACTURE SURGERY     "left wrist; broke it; took spur off"   HOLMIUM LASER APPLICATION  Right 02/23/4826   Procedure: HOLMIUM LASER APPLICATION;  Surgeon: [name], MD;  Location: [facility];  Service: Urology;  Laterality: Right;   KNEE ARTHROSCOPY Right 05/18/2018   Procedure: PARTIAL MEDIAL MENISECTOMY AND SURGICAL LAVAGE AND CHONDROPLASTY;  Surgeon: [name], MD;  Location: [place];  Service: Orthopedics;  Laterality: Right;   LEFT HEART CATHETERIZATION WITH CORONARY ANGIOGRAM N/A 12/27/2013   Procedure: LEFT HEART CATHETERIZATION WITH CORONARY ANGIOGRAM;  Surgeon: [name], MD;  Location: [facility] CATH LAB;  Service: Cardiovascular;  Laterality: N/A;   neck epidural     POSTERIOR LUMBAR FUSION 4 LEVEL N/A 03/28/2018   Procedure: Thoracic eight -Lumbar two- FIXATION WITH SCREW PLACEMENT, DECOMPRESSION Thoracic ten-Thoracic eleven  FOR OSTEOMYELITIS;  Surgeon: Kristeen Miss, MD;  Location: [place];  Service: Neurosurgery;  Laterality: N/A;   SHOULDER SURGERY Bilateral    SPINAL CORD STIMULATOR REMOVAL N/A 02/25/2020   Procedure: LUMBAR SPINAL CORD STIMULATOR REMOVAL;  Surgeon: Kristeen Miss, MD;  Location: [place];  Service: Neurosurgery;  Laterality: N/A;   TEE WITHOUT CARDIOVERSION N/A 03/21/2018   Procedure: TRANSESOPHAGEAL ECHOCARDIOGRAM (TEE) WITH PROPOFOL;  Surgeon: [name], MD;  [facility];  Service: Cardiovascular;  Laterality: N/A;    There were no vitals filed for this visit.   Subjective Assessment - 06/11/21 1316     Subjective Patient states he is doing HEP. He feels 100% improvment since beginning therapy. He is not having any more trouble with his leg.    Pertinent History Multiple lumbar and cervical fusions, paraplegic, CVA April 03, 2021    Currently in Pain? No/denies                [facility] PT Assessment - 06/11/21 0001       Assessment   Medical Diagnosis Recent Stroke R sided weakness    Referring Provider (PT) Asencion Noble MD    Onset Date/Surgical Date 04/03/21    Next MD Visit July 1st    Prior Therapy yes      Precautions    Precautions Fall      Restrictions   Weight Bearing Restrictions No      Balance Screen   Has the patient fallen in the past 6 months Yes    How many times? 2    Has the patient had a decrease in activity level because of a fear of falling?  No    Is the patient reluctant to leave their home because of a fear of falling?  No      Prior Function   Level of Independence Independent with homemaking with wheelchair;Independent with basic ADLs    Vocation Retired      Charity fundraiser Status Within Functional Limits for tasks assessed      Observation/Other Assessments   Observations Seated in [facility]      Strength   Right Hip Flexion 4+/5    Left Hip  Flexion 4+/5    Right Knee Flexion 5/5    Right Knee Extension 5/5    Left Knee Flexion 5/5    Left Knee Extension 5/5    Right Ankle Dorsiflexion 5/5    Left Ankle Dorsiflexion 5/5      Berg Balance Test   Sit to Stand Able to stand  independently using hands    Standing Unsupported Able to stand 2 minutes with supervision    Sitting with Back Unsupported but Feet Supported on Floor or Stool Able to sit safely and securely 2 minutes    Stand to Sit Controls descent by using hands    Transfers Able to transfer safely, definite need of hands    Standing Unsupported with Eyes Closed Able to stand 10 seconds with supervision    Standing Unsupported with Feet Together Needs help to attain position and unable to hold for 15 seconds    From Standing, Reach Forward with Outstretched Arm Can reach forward >5 cm safely (2")    From Standing Position, Pick up Object from Floor Unable to pick up and needs supervision    From Standing Position, Turn to Look Behind Over each Shoulder Needs supervision when turning    Turn 360 Degrees Needs close supervision or verbal cueing    Standing Unsupported, Alternately Place Feet on Step/Stool Needs assistance to keep from falling or unable to try    Standing Unsupported, One Foot in [employer] balance while stepping or standing    Standing on One Leg Unable to try or needs assist to prevent fall    Total Score 24    Berg comment: high risk of falls, unsteady without RW                                   PT Education - 06/11/21 1315     Education Details HEP    Person(s) Educated Patient    Methods Explanation;Demonstration    Comprehension Verbalized understanding;Returned demonstration              PT Short Term Goals - 06/11/21 1319       PT SHORT TERM GOAL #1   Title Patient will be independent in self management strategies to improve quality of life and functional outcomes.    Time 3    Period Weeks    Status Achieved    Target Date 05/26/21      PT SHORT TERM GOAL #2   Title Patient will report at least 25% improvement in overall symptoms and/or functional ability.    Time 3    Period Weeks    Status Achieved    Target Date 05/26/21               PT Long Term Goals - 06/11/21 1319       PT LONG TERM GOAL #1   Title Patient will report at least 50% improvement in overall symptoms and/or functional ability.    Time 6    Period Weeks    Status Achieved      PT LONG TERM GOAL #2   Title Patient will demonstrate ability to stand at a support surface for 10 minutes without a rest break in order to perform household chores and ADL's with increased ease.     Time 6    Period Weeks    Status Partially Met      PT LONG  TERM GOAL #3   Title Patient will demonstrate ability to ambulate 120 feet without rest break using LRAD with no more than minimal assistance.    Time 6    Period Weeks    Status Achieved      PT LONG TERM GOAL #4   Title Patient will be participating in exercise program at home or [employer], 40-60 minutes of moderate aerobic exercise in addition to therapy, to improve health/wellness in order to lose weight for improved mobility and function.    Time 6    Period Weeks    Status Not Met      PT LONG  TERM GOAL #5   Title Patient will score at least 37 on Berg balance test in order to reduce the risk of falls.    Time 6    Period Weeks    Status Not Met                   Plan - 06/11/21 1315     Clinical Impression Statement Patient has met 2/2 short term goals and 3/5 long term goals with stated ability to complete HEP and improvement in symptoms and ambulation. Patient showing improving balance with improved performance on BERG but continues to remain limited. Remaining goals not met due to only some improvement in standing time, not beginning an exercise program, and impaired balance. Patient educated on continuing to ambulate at home and perform HEP. Patient discharged from physical therapy at this time.    Personal Factors and Comorbidities Age;Fitness;Behavior Pattern;Past/Current Experience;Comorbidity 3+;Time since onset of injury/illness/exacerbation    Comorbidities CVA, lumbar and cervical fusions, paraplegia    Examination-Activity Limitations Locomotion Level;Transfers;Bed Mobility;Squat;Stairs;Stand;Lift;Hygiene/Grooming    Examination-Participation Restrictions Meal Prep;Cleaning;Community Activity;Volunteer;Shop;Yard Work;Laundry    Stability/Clinical Decision Making Stable/Uncomplicated    Rehab Potential Good    PT Frequency --    PT Duration --    PT Treatment/Interventions ADLs/Self Care Home Management;Aquatic Therapy;Cryotherapy;Electrical Stimulation;Iontophoresis 44m/ml Dexamethasone;Moist Heat;Traction;Ultrasound;DME Instruction;Gait training;Stair training;Functional mobility training;Therapeutic activities;Therapeutic exercise;Balance training;Neuromuscular re-education;Patient/family education;Manual techniques;Dry needling;Energy conservation;Vasopneumatic Device;Splinting;Taping;Vestibular;Spinal Manipulations;Joint Manipulations;Manual lymph drainage;Orthotic Fit/Training;Compression bandaging    PT Next Visit Plan n/a             Patient will  benefit from skilled therapeutic intervention in order to improve the following deficits and impairments:  Abnormal gait, Difficulty walking, Decreased endurance, Decreased activity tolerance, Pain, Decreased balance, Impaired flexibility, Improper body mechanics, Postural dysfunction, Decreased strength, Decreased mobility  Visit Diagnosis: Muscle weakness (generalized)  Other abnormalities of gait and mobility  Difficulty in walking, not elsewhere classified     Problem List Patient Active Problem List   Diagnosis Date Noted   Stroke ([facility] 04/29/2021   Infection of spinal cord stimulator ([facility] 02/26/2020   Pseudogout of knee    Pyogenic arthritis of right knee joint ([facility]    Falls frequently 05/08/2018   Fall    Sleep disturbance    Post-op pain    Hypokalemia    Morbid obesity ([place]    Postoperative pain    DVT, lower extremity, distal, acute, bilateral ([facility])    Acute blood loss anemia    Anemia of chronic disease    Essential hypertension    Bacteremia    Myelopathy ([facility] 03/30/2018   Paraplegia (H[facility])    Postlaminectomy syndrome, lumbar region    Epidural abscess    Abdominal distension    Encephalopathy    Weakness of both lower extremities    Discitis of thoracic region    Spinal  cord stimulator status    Staphylococcus aureus sepsis ([place]) 03/20/2018   Bacteremia due to methicillin susceptible Staphylococcus aureus (MSSA) 03/18/2018   Obesity 03/18/2018   Nephrolithiasis 03/16/2018   AKI (acute kidney injury) ([place]) 03/16/2018   Cognitive impairment 03/16/2018   Chronic pain syndrome 03/16/2018   Cervicogenic headache 01/28/2016   B12 deficiency 01/28/2016   Memory difficulty 07/23/2015   History of cerebrovascular disease 07/23/2015   FH: colon cancer 08/13/2014   Hx of adenomatous colonic polyps 08/13/2014   Chronic back pain 05/30/2012   CAD, NATIVE VESSEL 07/17/2010   HYPERCHOLESTEROLEMIA 10/31/2009   SMOKELESS TOBACCO ABUSE 10/31/2009   Obstructive  sleep apnea 10/31/2009   Essential hypertension, benign 10/31/2009   GERD 10/31/2009     1:58 PM, 06/11/21 Mearl Latin PT, DPT Physical Therapist at [place], [place], [address] Phone: [phone]   Fax:  [phone]  Name: MARCQUIS RIDLON MRN: [id number] Date of Birth: October 26, 1946

## 2021-06-17 DIAGNOSIS — I10 Essential (primary) hypertension: Secondary | ICD-10-CM | POA: Diagnosis not present

## 2021-06-17 DIAGNOSIS — G4731 Primary central sleep apnea: Secondary | ICD-10-CM | POA: Diagnosis not present

## 2021-06-17 DIAGNOSIS — F4310 Post-traumatic stress disorder, unspecified: Secondary | ICD-10-CM | POA: Insufficient documentation

## 2021-06-17 DIAGNOSIS — Z6837 Body mass index (BMI) 37.0-37.9, adult: Secondary | ICD-10-CM | POA: Diagnosis not present

## 2021-06-17 DIAGNOSIS — G8220 Paraplegia, unspecified: Secondary | ICD-10-CM | POA: Diagnosis not present

## 2021-06-17 DIAGNOSIS — I639 Cerebral infarction, unspecified: Secondary | ICD-10-CM | POA: Diagnosis not present

## 2021-06-17 DIAGNOSIS — G4733 Obstructive sleep apnea (adult) (pediatric): Secondary | ICD-10-CM | POA: Diagnosis not present

## 2021-06-24 DIAGNOSIS — Z20822 Contact with and (suspected) exposure to covid-19: Secondary | ICD-10-CM | POA: Diagnosis not present

## 2021-06-29 DIAGNOSIS — G464 Cerebellar stroke syndrome: Secondary | ICD-10-CM | POA: Diagnosis not present

## 2021-06-29 DIAGNOSIS — Z79899 Other long term (current) drug therapy: Secondary | ICD-10-CM | POA: Diagnosis not present

## 2021-06-29 DIAGNOSIS — E785 Hyperlipidemia, unspecified: Secondary | ICD-10-CM | POA: Diagnosis not present

## 2021-07-06 DIAGNOSIS — J310 Chronic rhinitis: Secondary | ICD-10-CM | POA: Diagnosis not present

## 2021-07-06 DIAGNOSIS — E785 Hyperlipidemia, unspecified: Secondary | ICD-10-CM | POA: Diagnosis not present

## 2021-07-23 DIAGNOSIS — Z23 Encounter for immunization: Secondary | ICD-10-CM | POA: Diagnosis not present

## 2021-08-04 DIAGNOSIS — R5383 Other fatigue: Secondary | ICD-10-CM | POA: Diagnosis not present

## 2021-08-05 DIAGNOSIS — H5213 Myopia, bilateral: Secondary | ICD-10-CM | POA: Diagnosis not present

## 2021-08-05 DIAGNOSIS — H52223 Regular astigmatism, bilateral: Secondary | ICD-10-CM | POA: Diagnosis not present

## 2021-08-05 DIAGNOSIS — H25813 Combined forms of age-related cataract, bilateral: Secondary | ICD-10-CM | POA: Diagnosis not present

## 2021-08-05 DIAGNOSIS — H524 Presbyopia: Secondary | ICD-10-CM | POA: Diagnosis not present

## 2021-08-12 ENCOUNTER — Other Ambulatory Visit: Payer: Self-pay

## 2021-08-12 ENCOUNTER — Ambulatory Visit (INDEPENDENT_AMBULATORY_CARE_PROVIDER_SITE_OTHER): Payer: Medicare Other | Admitting: Student

## 2021-08-12 ENCOUNTER — Encounter: Payer: Self-pay | Admitting: *Deleted

## 2021-08-12 ENCOUNTER — Encounter: Payer: Self-pay | Admitting: Student

## 2021-08-12 VITALS — BP 122/82 | HR 82 | Ht 76.0 in | Wt 313.6 lb

## 2021-08-12 DIAGNOSIS — I2510 Atherosclerotic heart disease of native coronary artery without angina pectoris: Secondary | ICD-10-CM | POA: Diagnosis not present

## 2021-08-12 DIAGNOSIS — I639 Cerebral infarction, unspecified: Secondary | ICD-10-CM | POA: Diagnosis not present

## 2021-08-12 DIAGNOSIS — I10 Essential (primary) hypertension: Secondary | ICD-10-CM

## 2021-08-12 DIAGNOSIS — E785 Hyperlipidemia, unspecified: Secondary | ICD-10-CM

## 2021-08-12 NOTE — Patient Instructions (Signed)
Medication Instructions:  Your physician recommends that you continue on your current medications as directed. Please refer to the Current Medication list given to you today.  *If you need a refill on your cardiac medications before your next appointment, please call your pharmacy*   Lab Work: NONE   If you have labs (blood work) drawn today and your tests are completely normal, you will receive your results only by: MyChart Message (if you have MyChart) OR A paper copy in the mail If you have any lab test that is abnormal or we need to change your treatment, we will call you to review the results.   Testing/Procedures: NONE    Follow-Up: At [facility], you and your health needs are our priority.  As part of our continuing mission to provide you with exceptional heart care, we have created designated Provider Care Teams.  These Care Teams include your primary Cardiologist (physician) and Advanced Practice Providers (APPs -  Physician Assistants and Nurse Practitioners) who all work together to provide you with the care you need, when you need it.  We recommend signing up for the patient portal called "MyChart".  Sign up information is provided on this After Visit Summary.  MyChart is used to connect with patients for Virtual Visits (Telemedicine).  Patients are able to view lab/test results, encounter notes, upcoming appointments, etc.  Non-urgent messages can be sent to your provider as well.   To learn more about what you can do with MyChart, go to [email or web address].    Your next appointment:   1 year(s)  The format for your next appointment:   In Person  Provider:   Samuel McDowell, MD   Other Instructions Thank you for choosing [facility]!    

## 2021-08-12 NOTE — Progress Notes (Signed)
Cardiology Office Note    Date:  08/12/2021   ID:  Cody Velez, DOB [birth year], MRN [id number]  PCP:  Asencion Noble, MD  Cardiologist: Rozann Lesches, MD    Chief Complaint  Patient presents with   Follow-up    Annual Visit    History of Present Illness:    Cody Velez is a 75 y.o. male with past medical history of CAD (s/p cath in 12/2013 showing mild nonobstructive CAD with low-risk NST's in 2016 and 02/2018), HTN, HLD, prior CVA and OSA who presents to the office today for annual follow-up.  He was last examined by Dr. Domenic Polite in 07/2020 and denied any recent anginal symptoms at that time. He was continued on his current medication regimen and informed to follow-up in 6 months.  In talking with the patient today, he reports overall doing well from a cardiac perspective since his last office visit. He is mostly wheelchair-bound at baseline due to prior back surgeries and associated neuropathy along his legs. He did suffer a CVA in 03/2021 and reports worsening weakness around that time but this has gradually improved over the past several months. He denies any recent exertional chest pain. He does experience an occasional "shooting pain" which lasts for a few seconds and spontaneously resolves. No persistent symptoms. He reports his breathing has overall been stable and denies any orthopnea or PND.  He does experience intermittent lower extremity edema but has not required the use of diuretic therapy.  Past Medical History:  Diagnosis Date   Allergic rhinitis    Arthritis    B12 deficiency    Cervicogenic headache    Childhood asthma    Chronic back pain    Coronary atherosclerosis of native coronary artery    Mild nonobstructive CAD at catheterization January 2015   Dementia [facility])    Depression    Essential hypertension    Falls    GERD (gastroesophageal reflux disease)    Glaucoma    History of blood transfusion    History of cerebrovascular disease 07/23/2015    History of kidney stones    History of pneumonia 02/2011   Hyperlipidemia    OSA (obstructive sleep apnea)    CPAP - Dr. [name] - uses cpap every night   Pneumonia    Prostate cancer [facility])    PTSD (post-traumatic stress disorder)    [place]   Rectal bleeding    Stroke ([place]) 03/2021   Wheelchair bound     Past Surgical History:  Procedure Laterality Date   APPLICATION OF ROBOTIC ASSISTANCE FOR SPINAL PROCEDURE N/A 03/28/2018   Procedure: APPLICATION OF ROBOTIC ASSISTANCE FOR SPINAL PROCEDURE;  Surgeon: Kristeen Miss, MD;  Location: [place];  Service: Neurosurgery;  Laterality: N/A;   BACK SURGERY  02/14/12   lumbar OR #7; "today redid L1L2; replaced screws; added bone from hip"   BILATERAL KNEE ARTHROSCOPY     COLONOSCOPY  10/15/2008   Dr. [name]: tubular adenoma    COLONOSCOPY  12/17/2003   [id number] rectal and colon   COLONOSCOPY N/A 09/05/2014   Procedure: COLONOSCOPY;  Surgeon: Daneil Dolin, MD;  [facility];  Service: Endoscopy;  Laterality: N/A;  7:30-rescheduled 9/17 to [place] notified pt   CYSTOSCOPY WITH STENT PLACEMENT Right 01/27/2016   Procedure: CYSTOSCOPY WITH STENT PLACEMENT;  Surgeon: [name], MD;  Location: [facility];  Service: Urology;  Laterality: Right;   CYSTOSCOPY/RETROGRADE/URETEROSCOPY/STONE EXTRACTION WITH BASKET Right 01/27/2016   Procedure: CYSTOSCOPY, RIGHT RETROGRADE,  RIGHT URETEROSCOPY, STONE EXTRACTION ;  Surgeon: [name], MD;  Location: [facility];  Service: Urology;  Laterality: Right;   ESOPHAGOGASTRODUODENOSCOPY  10/15/2008     Dr Rourk:Schatzki's ring status post dilation and disruption via [address] dilator/ otherwise unremarkable esophagus, small hiatal hernia, multiple fundal gland polyps not manipulated, gastritis, negative H.pylori   ESOPHAGOGASTRODUODENOSCOPY  06/21/02   [id number] sliding hiatal hernia with mild changes of reflux esophagitis limited to gastroesophageal junction.  Noncritical ring at distal  esophagus, 3 cm proximal to gastroesophageal junction/Antral gastritis   FACIAL COSMETIC SURGERY  ` 1985   "broke face playing softball"   FRACTURE SURGERY     "left wrist; broke it; took spur off"   HOLMIUM LASER APPLICATION Right [id number]   Procedure: HOLMIUM LASER APPLICATION;  Surgeon: [name], MD;  [facility];  Service: Urology;  Laterality: Right;   KNEE ARTHROSCOPY Right 05/18/2018   Procedure: PARTIAL MEDIAL MENISECTOMY AND SURGICAL LAVAGE AND CHONDROPLASTY;  Surgeon: [name], MD;  Location: [place];  Service: Orthopedics;  Laterality: Right;   LEFT HEART CATHETERIZATION WITH CORONARY ANGIOGRAM N/A 12/27/2013   Procedure: LEFT HEART CATHETERIZATION WITH CORONARY ANGIOGRAM;  Surgeon: [name], MD;  [facility]: [facility] CATH LAB;  Service: Cardiovascular;  Laterality: N/A;   neck epidural     POSTERIOR LUMBAR FUSION 4 LEVEL N/A 03/28/2018   Procedure: Thoracic eight -Lumbar two- FIXATION WITH SCREW PLACEMENT, DECOMPRESSION Thoracic ten-Thoracic eleven  FOR OSTEOMYELITIS;  Surgeon: Kristeen Miss, MD;  Location: [place];  Service: Neurosurgery;  Laterality: N/A;   SHOULDER SURGERY Bilateral    SPINAL CORD STIMULATOR REMOVAL N/A 02/25/2020   Procedure: LUMBAR SPINAL CORD STIMULATOR REMOVAL;  Surgeon: Kristeen Miss, MD;  Location: [place];  Service: Neurosurgery;  Laterality: N/A;   TEE WITHOUT CARDIOVERSION N/A 03/21/2018   Procedure: TRANSESOPHAGEAL ECHOCARDIOGRAM (TEE) WITH PROPOFOL;  Surgeon: [name], MD;  [facility];  Service: Cardiovascular;  Laterality: N/A;    Current Medications: Outpatient Medications Prior to Visit  Medication Sig Dispense Refill   alfuzosin (UROXATRAL) 10 MG 24 hr tablet Take 10 mg by mouth daily with breakfast.     amLODipine (NORVASC) 5 MG tablet Take 5 mg by mouth daily.     baclofen (LIORESAL) 10 MG tablet TAKE ONE TABLET IN THE MORNING AND MIDDAY, THEN TWO TABLETS AT NIGHT 360 each 3   clopidogrel (PLAVIX) 75 MG tablet Take  1 tablet (75 mg total) by mouth daily. 30 tablet 2   cycloSPORINE (RESTASIS) 0.05 % ophthalmic emulsion 1 DROP INTO BOTH EYES TWICE A DAY FOR DRY EYES 0.4 mL 0   docusate sodium (COLACE) 100 MG capsule Take 2 capsules (200 mg total) by mouth 2 (two) times daily. 10 capsule 0   donepezil (ARICEPT) 10 MG tablet Take 1 tablet (10 mg total) by mouth at bedtime. 30 tablet 0   finasteride (PROSCAR) 5 MG tablet Take 1 tablet (5 mg total) by mouth daily. Reported on 05/04/2016 30 tablet 0   fluticasone (FLONASE) 50 MCG/ACT nasal spray Place 1 spray into both nostrils daily as needed for allergies. 16 g 2   gabapentin (NEURONTIN) 400 MG capsule Take 400 mg by mouth 2 (two) times daily.     losartan (COZAAR) 100 MG tablet Take 100 mg by mouth daily.     Melatonin 10 MG TABS Take 10 mg by mouth at bedtime. Per pt taking 1/2 tablet of 10 mg.     meloxicam (MOBIC) 7.5 MG tablet Take 7.5  mg by mouth 2 (two) times daily.     memantine (NAMENDA) 10 MG tablet Take 1 tablet (10 mg total) by mouth 2 (two) times daily. 60 tablet 0   omeprazole (PRILOSEC) 20 MG capsule Take 2 capsules (40 mg total) by mouth daily. 30 capsule 0   Oxycodone HCl 10 MG TABS Take 1 tablet (10 mg total) by mouth every 4 (four) hours as needed (moderate to severe pain). 15 tablet 0   polyethylene glycol (MIRALAX / GLYCOLAX) packet Take 17 g by mouth daily. 14 each 0   pravastatin (PRAVACHOL) 10 MG tablet Take 1 tablet (10 mg total) by mouth every evening. 30 tablet 0   traZODone (DESYREL) 100 MG tablet Take 1 tablet (100 mg total) by mouth at bedtime. 30 tablet 0   venlafaxine XR (EFFEXOR-XR) 150 MG 24 hr capsule Take 1 capsule (150 mg total) by mouth 2 (two) times daily. 60 capsule 0   aspirin 81 MG tablet Take 1 tablet (81 mg total) by mouth daily. 30 tablet 0   No facility-administered medications prior to visit.     Allergies:   Patient has no known allergies.   Social History   Socioeconomic History   Marital status: Widowed     Spouse name: Rise Paganini   Number of children: 3   Years of education: Not on file   Highest education level: Not on file  Occupational History   Occupation: Print production planner    Employer: [employer]  Tobacco Use   Smoking status: Former    Types: Cigarettes    Quit date: 12/20/1958    Years since quitting: 62.6   Smokeless tobacco: Current    Types: Chew  Vaping Use   Vaping Use: Never used  Substance and Sexual Activity   Alcohol use: Not Currently    Alcohol/week: 0.0 standard drinks    Comment: couple bottles of wine per month   Drug use: No    Types: Marijuana    Comment: "last used marijuana ~ 1969"   Sexual activity: Not on file  Other Topics Concern   Not on file  Social History Narrative   Married, 3 daughters; retired   Patient drinks about 1 cup of coffee daily.   Patient is left handed.   Social Determinants of Health   Financial Resource Strain: Not on file  Food Insecurity: Not on file  Transportation Needs: Not on file  Physical Activity: Not on file  Stress: Not on file  Social Connections: Not on file     Family History:  The patient's family history includes Asthma in an other family member; Breast cancer in his mother; CAD in his father; Colon cancer in his mother; Dementia in his paternal uncle; Emphysema in his father and maternal grandmother; Heart attack in his brother; Heart disease in his paternal grandfather; Heart failure in his father; Lung cancer in his father; Stroke in his maternal grandmother, mother, and sister.   Review of Systems:    Please see the history of present illness.     All other systems reviewed and are otherwise negative except as noted above.   Physical Exam:    VS:  BP 122/82   Pulse 82   Ht '6\' 4"'$  (1.93 m)   Wt (!) 313 lb 9.6 oz (142.2 kg)   SpO2 96%   BMI 38.17 kg/m    General: Well developed, well nourished,male appearing in no acute distress. Sitting in wheelchair.  Head: Normocephalic, atraumatic. Neck: No carotid bruits. JVD not  elevated.  Lungs: Respirations regular and unlabored, without wheezes or rales.  Heart: Regular rate and rhythm. No S3 or S4.  No murmur, no rubs, or gallops appreciated. Abdomen: Appears non-distended. No obvious abdominal masses. Msk:  Strength and tone appear normal for age. No obvious joint deformities or effusions. Extremities: No clubbing or cyanosis. Trace lower extremity edema.  Distal pedal pulses are 2+ bilaterally. Neuro: Alert and oriented X 3. Moves all extremities spontaneously. No focal deficits noted. Psych:  Responds to questions appropriately with a normal affect. Skin: No rashes or lesions noted  Wt Readings from Last 3 Encounters:  08/12/21 (!) 313 lb 9.6 oz (142.2 kg)  04/29/21 (!) 312 lb 9.6 oz (141.8 kg)  04/08/21 300 lb (136.1 kg)     Studies/Labs Reviewed:   EKG:  EKG is not ordered today. EKG from 04/08/2021 is reviewed and shows NSR, HR 69 with PAC's and known LAFB.   Recent Labs: 04/08/2021: ALT 27; BUN 9; Creatinine, Ser 0.95; Hemoglobin 14.7; Platelets 345; Potassium 4.0; Sodium 138   Lipid Panel    Component Value Date/Time   CHOL 175 05/30/2012 1913   TRIG 194 (H) 05/30/2012 1913   HDL 28 (L) 05/30/2012 1913   CHOLHDL 6.3 05/30/2012 1913   VLDL 39 05/30/2012 1913   LDLCALC 108 (H) 05/30/2012 1913    Additional studies/ records that were reviewed today include:   NST: 09/2015 There was no ST segment deviation noted during stress. The study is normal. This is a low risk study. The left ventricular ejection fraction is normal (55-65%).   Normal resting and stress perfusion.   No RWMA;s EF 57%  NST: 02/2018   Assessment:    1. Atherosclerosis of native coronary artery of native heart without angina pectoris   2. Essential hypertension   3. Hyperlipidemia LDL goal <70      Plan:   In order of problems listed above:  1. CAD - He is s/p cath in 12/2013 showing mild nonobstructive CAD with low-risk NST's in 2016 and 02/2018. - His  activity is limited secondary to back pain and neuropathy but he denies any new symptoms concerning for angina.  - Continue current medication regimen with Plavix '75mg'$  daily (replaced ASA per Neurology), Losartan '100mg'$  daily and Pravastatin '10mg'$  daily.   2. HTN - His BP is well-controlled at 122/82 during today's visit. Continue current medication regimen with Losartan '100mg'$  daily and Amlodipine '5mg'$  daily.   3. HLD - Followed by his PCP. Will request a copy of most recent records. He remains on Pravastatin '10mg'$  daily with goal LDL less than 70 given known CAD and prior CVA.    Medication Adjustments/Labs and Tests Ordered: Current medicines are reviewed at length with the patient today.  Concerns regarding medicines are outlined above.  Medication changes, Labs and Tests ordered today are listed in the Patient Instructions below. Patient Instructions  Medication Instructions:  Your physician recommends that you continue on your current medications as directed. Please refer to the Current Medication list given to you today.  *If you need a refill on your cardiac medications before your next appointment, please call your pharmacy*   Lab Work: NONE   If you have labs (blood work) drawn today and your tests are completely normal, you will receive your results only by: [place] (if you have MyChart) OR A paper copy in the mail If you have any lab test that is abnormal or we need to change your treatment, we will call  you to review the results.   Testing/Procedures: NONE    Follow-Up: At [facility], you and your health needs are our priority.  As part of our continuing mission to provide you with exceptional heart care, we have created designated Provider Care Teams.  These Care Teams include your primary Cardiologist (physician) and Advanced Practice Providers (APPs -  Physician Assistants and Nurse Practitioners) who all work together to provide you with the care you need,  when you need it.  We recommend signing up for the patient portal called "MyChart".  Sign up information is provided on this After Visit Summary.  MyChart is used to connect with patients for Virtual Visits (Telemedicine).  Patients are able to view lab/test results, encounter notes, upcoming appointments, etc.  Non-urgent messages can be sent to your provider as well.   To learn more about what you can do with MyChart, go to [email or web address].    Your next appointment:   1 year(s)  The format for your next appointment:   In Person  Provider:   Rozann Lesches, MD   Other Instructions Thank you for choosing [facility]!     Signed, Erma Heritage, PA-C  08/12/2021 7:17 PM    [place] [address] [place], [place] [address] Phone: [phone] Fax: [phone]

## 2021-08-13 ENCOUNTER — Encounter: Payer: Self-pay | Admitting: Internal Medicine

## 2021-09-01 ENCOUNTER — Ambulatory Visit (INDEPENDENT_AMBULATORY_CARE_PROVIDER_SITE_OTHER): Payer: Medicare Other | Admitting: Neurology

## 2021-09-01 ENCOUNTER — Other Ambulatory Visit: Payer: Self-pay

## 2021-09-01 ENCOUNTER — Encounter: Payer: Self-pay | Admitting: Neurology

## 2021-09-01 VITALS — BP 149/89 | HR 43 | Ht 77.0 in | Wt 309.0 lb

## 2021-09-01 DIAGNOSIS — I639 Cerebral infarction, unspecified: Secondary | ICD-10-CM

## 2021-09-01 DIAGNOSIS — G4486 Cervicogenic headache: Secondary | ICD-10-CM | POA: Diagnosis not present

## 2021-09-01 DIAGNOSIS — G959 Disease of spinal cord, unspecified: Secondary | ICD-10-CM | POA: Diagnosis not present

## 2021-09-01 MED ORDER — GABAPENTIN 400 MG PO CAPS: 400.0000 mg | ORAL_CAPSULE | Freq: Two times a day (BID) | ORAL | 1 refills | Status: DC

## 2021-09-01 NOTE — Progress Notes (Signed)
Reason for visit: Cerebrovascular disease, gait disorder, thoracic myelopathy  Cody Velez is an 75 y.o. male  History of present illness:  Cody Velez is a 75 year old left-handed white male with a history of a thoracic myelopathy and a gait disorder.  The patient sustained a pontine stroke earlier in 2022, he has had a mild residual right hemiparesis.  He is essentially nonambulatory at this point but he can stand for transfers.  He has altered a lot of his own medications recently, he has run out of gabapentin and reduce his baclofen dose and his trazodone dose.  He is not sleeping well, he has developed increased headaches in the back of the head and does not feel good in general.  He has had no new sudden episodes of weakness or numbness.  Headache has been daily for him recently.  Past Medical History:  Diagnosis Date   Allergic rhinitis    Arthritis    B12 deficiency    Cervicogenic headache    Childhood asthma    Chronic back pain    Coronary atherosclerosis of native coronary artery    Mild nonobstructive CAD at catheterization January 2015   Dementia [facility])    Depression    Essential hypertension    Falls    GERD (gastroesophageal reflux disease)    Glaucoma    History of blood transfusion    History of cerebrovascular disease 07/23/2015   History of kidney stones    History of pneumonia 02/2011   Hyperlipidemia    OSA (obstructive sleep apnea)    CPAP - Dr. [name] - uses cpap every night   Pneumonia    Prostate cancer [facility])    PTSD (post-traumatic stress disorder)    [place]   Rectal bleeding    Stroke ([place]) 03/2021   Wheelchair bound     Past Surgical History:  Procedure Laterality Date   APPLICATION OF ROBOTIC ASSISTANCE FOR SPINAL PROCEDURE N/A 03/28/2018   Procedure: APPLICATION OF ROBOTIC ASSISTANCE FOR SPINAL PROCEDURE;  Surgeon: Kristeen Miss, MD;  Location: [place];  Service: Neurosurgery;  Laterality: N/A;   BACK SURGERY  02/14/12   lumbar OR #7;  "today redid L1L2; replaced screws; added bone from hip"   BILATERAL KNEE ARTHROSCOPY     COLONOSCOPY  10/15/2008   Dr. [name]: tubular adenoma    COLONOSCOPY  12/17/2003   [id number] rectal and colon   COLONOSCOPY N/A 09/05/2014   Procedure: COLONOSCOPY;  Surgeon: Daneil Dolin, MD;  [facility];  Service: Endoscopy;  Laterality: N/A;  7:30-rescheduled 9/17 to [place] notified pt   CYSTOSCOPY WITH STENT PLACEMENT Right 01/27/2016   Procedure: CYSTOSCOPY WITH STENT PLACEMENT;  Surgeon: [name], MD;  [facility]: [facility];  Service: Urology;  Laterality: Right;   CYSTOSCOPY/RETROGRADE/URETEROSCOPY/STONE EXTRACTION WITH BASKET Right 01/27/2016   Procedure: CYSTOSCOPY, RIGHT RETROGRADE, RIGHT URETEROSCOPY, STONE EXTRACTION ;  Surgeon: [name], MD;  [facility]: [facility];  Service: Urology;  Laterality: Right;   ESOPHAGOGASTRODUODENOSCOPY  10/15/2008     Dr Rourk:Schatzki's ring status post dilation and disruption via 44 F Maloney dilator/ otherwise unremarkable esophagus, small hiatal hernia, multiple fundal gland polyps not manipulated, gastritis, negative H.pylori   ESOPHAGOGASTRODUODENOSCOPY  06/21/02   [id number] sliding hiatal hernia with mild changes of reflux esophagitis limited to gastroesophageal junction.  Noncritical ring at distal esophagus, 3 cm proximal to gastroesophageal junction/Antral gastritis   FACIAL COSMETIC SURGERY  ` 1985   "broke face playing softball"   FRACTURE SURGERY     "  left wrist; broke it; took spur off"   HOLMIUM LASER APPLICATION Right [id number]   Procedure: HOLMIUM LASER APPLICATION;  Surgeon: [name], MD;  [facility]: [facility];  Service: Urology;  Laterality: Right;   KNEE ARTHROSCOPY Right 05/18/2018   Procedure: PARTIAL MEDIAL MENISECTOMY AND SURGICAL LAVAGE AND CHONDROPLASTY;  Surgeon: [name], MD;  Location: [place];  Service: Orthopedics;  Laterality: Right;   LEFT HEART CATHETERIZATION WITH CORONARY ANGIOGRAM N/A 12/27/2013    Procedure: LEFT HEART CATHETERIZATION WITH CORONARY ANGIOGRAM;  Surgeon: [name], MD;  Location: [facility] CATH LAB;  Service: Cardiovascular;  Laterality: N/A;   neck epidural     POSTERIOR LUMBAR FUSION 4 LEVEL N/A 03/28/2018   Procedure: Thoracic eight -Lumbar two- FIXATION WITH SCREW PLACEMENT, DECOMPRESSION Thoracic ten-Thoracic eleven  FOR OSTEOMYELITIS;  Surgeon: Kristeen Miss, MD;  Location: [place];  Service: Neurosurgery;  Laterality: N/A;   SHOULDER SURGERY Bilateral    SPINAL CORD STIMULATOR REMOVAL N/A 02/25/2020   Procedure: LUMBAR SPINAL CORD STIMULATOR REMOVAL;  Surgeon: Kristeen Miss, MD;  Location: [place];  Service: Neurosurgery;  Laterality: N/A;   TEE WITHOUT CARDIOVERSION N/A 03/21/2018   Procedure: TRANSESOPHAGEAL ECHOCARDIOGRAM (TEE) WITH PROPOFOL;  Surgeon: [name], MD;  [facility];  Service: Cardiovascular;  Laterality: N/A;    Family History  Problem Relation Age of Onset   Emphysema Father    Heart failure Father    Lung cancer Father    CAD Father    Colon cancer Mother    Stroke Mother    Breast cancer Mother    Stroke Sister    Heart attack Brother    Dementia Paternal Uncle    Emphysema Maternal Grandmother    Stroke Maternal Grandmother    Asthma Other        grandson   Heart disease Paternal Grandfather    Anesthesia problems Neg Hx    Hypotension Neg Hx    Malignant hyperthermia Neg Hx    Pseudochol deficiency Neg Hx     Social history:  reports that he quit smoking about 62 years ago. His smoking use included cigarettes. His smokeless tobacco use includes chew. He reports that he does not currently use alcohol. He reports that he does not use drugs.   No Known Allergies  Medications:  Prior to Admission medications   Medication Sig Start Date End Date Taking? Authorizing Provider  alfuzosin (UROXATRAL) 10 MG 24 hr tablet Take 10 mg by mouth daily with breakfast.   Yes [provider]  amLODipine (NORVASC) 5 MG  tablet Take 5 mg by mouth daily.   Yes [provider]  baclofen (LIORESAL) 10 MG tablet TAKE ONE TABLET IN THE MORNING AND MIDDAY, THEN TWO TABLETS AT NIGHT 06/12/18  Yes Kathrynn Ducking, MD  clopidogrel (PLAVIX) 75 MG tablet Take 1 tablet (75 mg total) by mouth daily. 04/08/21  Yes Fredia Sorrow, MD  cycloSPORINE (RESTASIS) 0.05 % ophthalmic emulsion 1 DROP INTO BOTH EYES TWICE A DAY FOR DRY EYES 05/22/18  Yes Bonnielee Haff, MD  docusate sodium (COLACE) 100 MG capsule Take 2 capsules (200 mg total) by mouth 2 (two) times daily. 05/22/18  Yes Bonnielee Haff, MD  donepezil (ARICEPT) 10 MG tablet Take 1 tablet (10 mg total) by mouth at bedtime. 05/22/18  Yes Bonnielee Haff, MD  finasteride (PROSCAR) 5 MG tablet Take 1 tablet (5 mg total) by mouth daily. Reported on 05/04/2016 05/22/18  Yes Bonnielee Haff, MD  fluticasone [facility]) 50 MCG/ACT  nasal spray Place 1 spray into both nostrils daily as needed for allergies. 05/22/18  Yes Bonnielee Haff, MD  gabapentin (NEURONTIN) 400 MG capsule Take 400 mg by mouth 2 (two) times daily.   Yes [provider]  losartan (COZAAR) 100 MG tablet Take 100 mg by mouth daily.   Yes [provider]  Melatonin 10 MG TABS Take 10 mg by mouth at bedtime. Per pt taking 1/2 tablet of 10 mg.   Yes [provider]  meloxicam (MOBIC) 7.5 MG tablet Take 7.5 mg by mouth 2 (two) times daily. 08/12/20  Yes [provider]  memantine (NAMENDA) 10 MG tablet Take 1 tablet (10 mg total) by mouth 2 (two) times daily. 05/22/18  Yes Bonnielee Haff, MD  omeprazole (PRILOSEC) 20 MG capsule Take 2 capsules (40 mg total) by mouth daily. 05/22/18  Yes Bonnielee Haff, MD  Oxycodone HCl 10 MG TABS Take 1 tablet (10 mg total) by mouth every 4 (four) hours as needed (moderate to severe pain). 05/22/18  Yes Bonnielee Haff, MD  polyethylene glycol [facility] / GLYCOLAX) packet Take 17 g by mouth daily. 05/23/18  Yes Bonnielee Haff, MD  pravastatin (PRAVACHOL) 10  MG tablet Take 1 tablet (10 mg total) by mouth every evening. 05/22/18  Yes Bonnielee Haff, MD  traZODone (DESYREL) 100 MG tablet Take 1 tablet (100 mg total) by mouth at bedtime. 05/22/18  Yes Bonnielee Haff, MD  venlafaxine XR (EFFEXOR-XR) 150 MG 24 hr capsule Take 1 capsule (150 mg total) by mouth 2 (two) times daily. 05/22/18  Yes Bonnielee Haff, MD    ROS:  Out of a complete 14 system review of symptoms, the patient complains only of the following symptoms, and all other reviewed systems are negative.  Headache Leg weakness Walking difficulty  Blood pressure (!) 149/89, pulse (!) 43, height '6\' 5"'$  (1.956 m), weight (!) 309 lb (140.2 kg).  Physical Exam  General: The patient is alert and cooperative at the time of the examination.  Skin: No significant peripheral edema is noted.   Neurologic Exam  Mental status: The patient is alert and oriented x 3 at the time of the examination. The patient has apparent normal recent and remote memory, with an apparently normal attention span and concentration ability.  The Mini-Mental status examination done today shows a total score 27/30.   Cranial nerves: Facial symmetry is present. Speech is normal, no aphasia or dysarthria is noted. Extraocular movements are full. Visual fields are full.  Motor: The patient has good strength in the upper extremities.  Patient has diffuse 4-/5 strength in both lower extremities  Sensory examination: Soft touch sensation is symmetric on the face, arms, and legs.  Coordination: The patient has good finger-nose-finger bilaterally.  Gait and station: The patient cannot be ambulated.  Reflexes: Deep tendon reflexes are symmetric.   Assessment/Plan:  1.  History of thoracic myelopathy  2.  Gait disorder  3.  Cerebrovascular disease  The patient has returned near to baseline following the recent stroke.  He will get back on his baclofen, I will send in prescription for his gabapentin.  If his headaches  do not improve over the next several weeks he will contact me.  He will follow-up here in 6 months otherwise, he will see Dr. Leta Baptist in the future.  Jill Alexanders MD 09/01/2021 4:02 PM  [facility] [address] [place], [place] [address]  Phone [phone] Fax [phone]

## 2021-09-14 DIAGNOSIS — H2513 Age-related nuclear cataract, bilateral: Secondary | ICD-10-CM | POA: Diagnosis not present

## 2021-09-14 DIAGNOSIS — D3131 Benign neoplasm of right choroid: Secondary | ICD-10-CM | POA: Diagnosis not present

## 2021-10-06 ENCOUNTER — Ambulatory Visit: Payer: No Typology Code available for payment source | Admitting: Urology

## 2021-10-06 DIAGNOSIS — N138 Other obstructive and reflux uropathy: Secondary | ICD-10-CM

## 2021-10-14 ENCOUNTER — Ambulatory Visit: Payer: Medicare Other | Admitting: Neurology

## 2021-10-14 DIAGNOSIS — Z23 Encounter for immunization: Secondary | ICD-10-CM | POA: Diagnosis not present

## 2021-10-14 DIAGNOSIS — M4644 Discitis, unspecified, thoracic region: Secondary | ICD-10-CM | POA: Diagnosis not present

## 2021-10-14 DIAGNOSIS — Z8673 Personal history of transient ischemic attack (TIA), and cerebral infarction without residual deficits: Secondary | ICD-10-CM | POA: Diagnosis not present

## 2021-10-14 DIAGNOSIS — I10 Essential (primary) hypertension: Secondary | ICD-10-CM | POA: Diagnosis not present

## 2021-10-28 ENCOUNTER — Ambulatory Visit: Payer: Medicare Other | Admitting: Neurology

## 2021-11-26 DIAGNOSIS — Z20822 Contact with and (suspected) exposure to covid-19: Secondary | ICD-10-CM | POA: Diagnosis not present

## 2021-12-01 ENCOUNTER — Other Ambulatory Visit: Payer: Self-pay

## 2021-12-01 ENCOUNTER — Ambulatory Visit (INDEPENDENT_AMBULATORY_CARE_PROVIDER_SITE_OTHER): Payer: Medicare Other | Admitting: Urology

## 2021-12-01 ENCOUNTER — Encounter: Payer: Self-pay | Admitting: Urology

## 2021-12-01 VITALS — BP 152/94 | HR 69

## 2021-12-01 DIAGNOSIS — I639 Cerebral infarction, unspecified: Secondary | ICD-10-CM

## 2021-12-01 DIAGNOSIS — N138 Other obstructive and reflux uropathy: Secondary | ICD-10-CM

## 2021-12-01 DIAGNOSIS — R351 Nocturia: Secondary | ICD-10-CM | POA: Diagnosis not present

## 2021-12-01 DIAGNOSIS — N401 Enlarged prostate with lower urinary tract symptoms: Secondary | ICD-10-CM | POA: Diagnosis not present

## 2021-12-01 DIAGNOSIS — C61 Malignant neoplasm of prostate: Secondary | ICD-10-CM

## 2021-12-01 LAB — MICROSCOPIC EXAMINATION
Bacteria, UA: NONE SEEN
RBC, Urine: NONE SEEN /hpf (ref 0–2)

## 2021-12-01 LAB — URINALYSIS, ROUTINE W REFLEX MICROSCOPIC
Bilirubin, UA: NEGATIVE
Glucose, UA: NEGATIVE
Ketones, UA: NEGATIVE
Nitrite, UA: NEGATIVE
Protein,UA: NEGATIVE
RBC, UA: NEGATIVE
Specific Gravity, UA: 1.015 (ref 1.005–1.030)
Urobilinogen, Ur: 0.2 mg/dL (ref 0.2–1.0)
pH, UA: 6.5 (ref 5.0–7.5)

## 2021-12-01 NOTE — Progress Notes (Signed)
Urological Symptom Review  Patient is experiencing the following symptoms: Get up at night to urinate Erection problems (male only)   Review of Systems  Gastrointestinal (upper)  : Negative for upper GI symptoms  Gastrointestinal (lower) : Constipation  Constitutional : Fatigue  Skin: Itching  Eyes: Blurred vision  Ear/Nose/Throat : Sinus problems  Hematologic/Lymphatic: Easy bruising  Cardiovascular : Leg swelling  Respiratory : Negative for respiratory symptoms  Endocrine: Negative for endocrine symptoms  Musculoskeletal: Back pain  Neurological: Negative for neurological symptoms  Psychologic: Negative for psychiatric symptoms

## 2021-12-01 NOTE — Progress Notes (Signed)
History of Present Illness:     TRUS/Bx March, 2016. At that time, 2 cores at the left apex revealed relatively low volume GS 3+3 pattern. Prostatic volume 65 ml. PSA 4.1. PSAD 0.06. Oncotype DX was performed at that time, as we anticipated active surveillance. This revealed a GPS score 11, favorable/low risk prostate cancer. Less than 5% chance of having high-grade disease/extraprostatic disease.  Repeat TRUS/Bx in February, 2017. Only one core at the left apex at that time revealed GS 3+3 , only 5% of core involved.   He was started on finasteride on 4.25.2016.   10.19.2021: Most recent PSA 1.7. Pt continues on finasteride.    ED:   6.16.2020: He first stated noticing pain on approximately 03/21/2011. His symptoms did begin gradually. His symptoms have been stable over the last year. He does not have difficulties achieving an erection. He does have problems maintaining his erections. His erections are straight. He does not have trouble reaching climax.   PDE 5 inhibitors have not worked. He is not currently on med Rx for ED.  12.13.2022: No recent PSA.  IPSS 11, quality-of-life score 0.  On both finasteride and alfuzosin.  He does have low energy levels.  He is wheelchair confined.  Past Medical History:  Diagnosis Date   Allergic rhinitis    Arthritis    B12 deficiency    Cervicogenic headache    Childhood asthma    Chronic back pain    Coronary atherosclerosis of native coronary artery    Mild nonobstructive CAD at catheterization January 2015   Dementia [facility])    Depression    Essential hypertension    Falls    GERD (gastroesophageal reflux disease)    Glaucoma    History of blood transfusion    History of cerebrovascular disease 07/23/2015   History of kidney stones    History of pneumonia 02/2011   Hyperlipidemia    OSA (obstructive sleep apnea)    CPAP - Dr. [name] - uses cpap every night   Pneumonia    Prostate cancer [facility])    PTSD (post-traumatic stress disorder)     [place]   Rectal bleeding    Stroke ([place]) 03/2021   Wheelchair bound     Past Surgical History:  Procedure Laterality Date   APPLICATION OF ROBOTIC ASSISTANCE FOR SPINAL PROCEDURE N/A 03/28/2018   Procedure: APPLICATION OF ROBOTIC ASSISTANCE FOR SPINAL PROCEDURE;  Surgeon: Kristeen Miss, MD;  Location: [place];  Service: Neurosurgery;  Laterality: N/A;   BACK SURGERY  02/14/12   lumbar OR #7; "today redid L1L2; replaced screws; added bone from hip"   BILATERAL KNEE ARTHROSCOPY     COLONOSCOPY  10/15/2008   Dr. [name]: tubular adenoma    COLONOSCOPY  12/17/2003   [id number] rectal and colon   COLONOSCOPY N/A 09/05/2014   Procedure: COLONOSCOPY;  Surgeon: Daneil Dolin, MD;  [facility];  Service: Endoscopy;  Laterality: N/A;  7:30-rescheduled 9/17 to [place] notified pt   CYSTOSCOPY WITH STENT PLACEMENT Right 01/27/2016   Procedure: CYSTOSCOPY WITH STENT PLACEMENT;  Surgeon: [name], MD;  [facility];  Service: Urology;  Laterality: Right;   CYSTOSCOPY/RETROGRADE/URETEROSCOPY/STONE EXTRACTION WITH BASKET Right 01/27/2016   Procedure: CYSTOSCOPY, RIGHT RETROGRADE, RIGHT URETEROSCOPY, STONE EXTRACTION ;  Surgeon: [name], MD;  Location: [facility];  Service: Urology;  Laterality: Right;   ESOPHAGOGASTRODUODENOSCOPY  10/15/2008     Dr Rourk:Schatzki's ring status post dilation and disruption via 81 F Maloney dilator/ otherwise unremarkable  esophagus, small hiatal hernia, multiple fundal gland polyps not manipulated, gastritis, negative H.pylori   ESOPHAGOGASTRODUODENOSCOPY  06/21/02   [id number] sliding hiatal hernia with mild changes of reflux esophagitis limited to gastroesophageal junction.  Noncritical ring at distal esophagus, 3 cm proximal to gastroesophageal junction/Antral gastritis   FACIAL COSMETIC SURGERY  ` 1985   "broke face playing softball"   FRACTURE SURGERY     "left wrist; broke it; took spur off"   HOLMIUM LASER APPLICATION Right 08/27/9891    Procedure: HOLMIUM LASER APPLICATION;  Surgeon: [name], MD;  Location: [facility];  Service: Urology;  Laterality: Right;   KNEE ARTHROSCOPY Right 05/18/2018   Procedure: PARTIAL MEDIAL MENISECTOMY AND SURGICAL LAVAGE AND CHONDROPLASTY;  Surgeon: [name], MD;  Location: [place];  Service: Orthopedics;  Laterality: Right;   LEFT HEART CATHETERIZATION WITH CORONARY ANGIOGRAM N/A 12/27/2013   Procedure: LEFT HEART CATHETERIZATION WITH CORONARY ANGIOGRAM;  Surgeon: [name], MD;  Location: [facility] CATH LAB;  Service: Cardiovascular;  Laterality: N/A;   neck epidural     POSTERIOR LUMBAR FUSION 4 LEVEL N/A 03/28/2018   Procedure: Thoracic eight -Lumbar two- FIXATION WITH SCREW PLACEMENT, DECOMPRESSION Thoracic ten-Thoracic eleven  FOR OSTEOMYELITIS;  Surgeon: Kristeen Miss, MD;  Location: [place];  Service: Neurosurgery;  Laterality: N/A;   SHOULDER SURGERY Bilateral    SPINAL CORD STIMULATOR REMOVAL N/A 02/25/2020   Procedure: LUMBAR SPINAL CORD STIMULATOR REMOVAL;  Surgeon: Kristeen Miss, MD;  Location: [place];  Service: Neurosurgery;  Laterality: N/A;   TEE WITHOUT CARDIOVERSION N/A 03/21/2018   Procedure: TRANSESOPHAGEAL ECHOCARDIOGRAM (TEE) WITH PROPOFOL;  Surgeon: [name], MD;  [facility];  Service: Cardiovascular;  Laterality: N/A;    Home Medications:  Allergies as of 12/01/2021   No Known Allergies      Medication List        Accurate as of December 01, 2021  8:59 AM. If you have any questions, ask your nurse or doctor.          alfuzosin 10 MG 24 hr tablet Commonly known as: UROXATRAL Take 10 mg by mouth daily with breakfast.   amLODipine 5 MG tablet Commonly known as: NORVASC Take 5 mg by mouth daily.   baclofen 10 MG tablet Commonly known as: LIORESAL TAKE ONE TABLET IN THE MORNING AND MIDDAY, THEN TWO TABLETS AT NIGHT   clopidogrel 75 MG tablet Commonly known as: PLAVIX Take 1 tablet (75 mg total) by mouth daily.   cycloSPORINE 0.05 %  ophthalmic emulsion Commonly known as: RESTASIS 1 DROP INTO BOTH EYES TWICE A DAY FOR DRY EYES   docusate sodium 100 MG capsule Commonly known as: COLACE Take 2 capsules (200 mg total) by mouth 2 (two) times daily.   donepezil 10 MG tablet Commonly known as: ARICEPT Take 1 tablet (10 mg total) by mouth at bedtime.   finasteride 5 MG tablet Commonly known as: PROSCAR Take 1 tablet (5 mg total) by mouth daily. Reported on 05/04/2016   fluticasone 50 MCG/ACT nasal spray Commonly known as: FLONASE Place 1 spray into both nostrils daily as needed for allergies.   gabapentin 400 MG capsule Commonly known as: NEURONTIN Take 1 capsule (400 mg total) by mouth 2 (two) times daily.   losartan 100 MG tablet Commonly known as: COZAAR Take 100 mg by mouth daily.   Melatonin 10 MG Tabs Take 10 mg by mouth at bedtime. Per pt taking 1/2 tablet of 10 mg.   meloxicam 7.5 MG tablet Commonly known  as: MOBIC Take 7.5 mg by mouth 2 (two) times daily.   memantine 10 MG tablet Commonly known as: NAMENDA Take 1 tablet (10 mg total) by mouth 2 (two) times daily.   omeprazole 20 MG capsule Commonly known as: PRILOSEC Take 2 capsules (40 mg total) by mouth daily.   Oxycodone HCl 10 MG Tabs Take 1 tablet (10 mg total) by mouth every 4 (four) hours as needed (moderate to severe pain).   polyethylene glycol 17 g packet Commonly known as: MIRALAX / GLYCOLAX Take 17 g by mouth daily.   pravastatin 10 MG tablet Commonly known as: PRAVACHOL Take 1 tablet (10 mg total) by mouth every evening.   traZODone 100 MG tablet Commonly known as: DESYREL Take 1 tablet (100 mg total) by mouth at bedtime.   venlafaxine XR 150 MG 24 hr capsule Commonly known as: EFFEXOR-XR Take 1 capsule (150 mg total) by mouth 2 (two) times daily.        Allergies: No Known Allergies  Family History  Problem Relation Age of Onset   Emphysema Father    Heart failure Father    Lung cancer Father    CAD Father     Colon cancer Mother    Stroke Mother    Breast cancer Mother    Stroke Sister    Heart attack Brother    Dementia Paternal Uncle    Emphysema Maternal Grandmother    Stroke Maternal Grandmother    Asthma Other        grandson   Heart disease Paternal Grandfather    Anesthesia problems Neg Hx    Hypotension Neg Hx    Malignant hyperthermia Neg Hx    Pseudochol deficiency Neg Hx     Social History:  reports that he quit smoking about 62 years ago. His smoking use included cigarettes. His smokeless tobacco use includes chew. He reports that he does not currently use alcohol. He reports that he does not use drugs.  ROS: A complete review of systems was performed.  All systems are negative except for pertinent findings as noted.  Physical Exam:  Vital signs in last 24 hours: There were no vitals taken for this visit. Constitutional:  Alert and oriented, No acute distress Cardiovascular: Regular rate  Respiratory: Normal respiratory effort GI: Abdomen is soft, nontender, nondistended, no abdominal masses. No CVAT.  Genitourinary: Slightly decreased anal sphincter tone.  There were no rectal masses.  Prostate 60 g, symmetrical, nonnodular, nontender. Lymphatic: No lymphadenopathy Neurologic: Grossly intact, no focal deficits Psychiatric: Normal mood and affect  I have reviewed prior pt notes  I have independently reviewed prior imaging  I have reviewed prior PSA results  Apparently patient had recent urinary tract infection-records not available   Impression/Assessment:  1.  Very low risk, low-volume prostate cancer, initial diagnosis 2016.  Confirmatory biopsy negative.  That was in 2017.  Because of multiple medical issues he has not had surveillance biopsy since then  2.  BPH, symptoms stable on dual medical therapy  Plan:  1.  I will check PSA and BUN/creatinine today  2.  I will arrange for the patient to have a prostate MRI.  I will repeat a biopsy of any  suspicious areas.  Otherwise, I will see back in a year.

## 2021-12-02 LAB — PSA: Prostate Specific Ag, Serum: 1.3 ng/mL (ref 0.0–4.0)

## 2021-12-12 ENCOUNTER — Encounter: Payer: Self-pay | Admitting: Nurse Practitioner

## 2021-12-12 ENCOUNTER — Telehealth: Payer: Medicare Other | Admitting: Nurse Practitioner

## 2021-12-12 DIAGNOSIS — R399 Unspecified symptoms and signs involving the genitourinary system: Secondary | ICD-10-CM | POA: Diagnosis not present

## 2021-12-12 MED ORDER — SULFAMETHOXAZOLE-TRIMETHOPRIM 800-160 MG PO TABS: 1.0000 | ORAL_TABLET | Freq: Two times a day (BID) | ORAL | 0 refills | Status: AC

## 2021-12-12 NOTE — Patient Instructions (Signed)
Cody Velez, thank you for joining Gildardo Pounds, NP for today's virtual visit.  While this provider is not your primary care provider (PCP), if your PCP is located in our provider database this encounter information will be shared with them immediately following your visit.  Consent: (Patient) Cody Velez provided verbal consent for this virtual visit at the beginning of the encounter.  Current Medications:  Current Outpatient Medications:    sulfamethoxazole-trimethoprim (BACTRIM DS) 800-160 MG tablet, Take 1 tablet by mouth 2 (two) times daily for 7 days., Disp: 14 tablet, Rfl: 0   alfuzosin (UROXATRAL) 10 MG 24 hr tablet, Take 10 mg by mouth daily with breakfast., Disp: , Rfl:    amLODipine (NORVASC) 5 MG tablet, Take 5 mg by mouth daily., Disp: , Rfl:    baclofen (LIORESAL) 10 MG tablet, TAKE ONE TABLET IN THE MORNING AND MIDDAY, THEN TWO TABLETS AT NIGHT, Disp: 360 each, Rfl: 3   clopidogrel (PLAVIX) 75 MG tablet, Take 1 tablet (75 mg total) by mouth daily., Disp: 30 tablet, Rfl: 2   cycloSPORINE (RESTASIS) 0.05 % ophthalmic emulsion, 1 DROP INTO BOTH EYES TWICE A DAY FOR DRY EYES, Disp: 0.4 mL, Rfl: 0   docusate sodium (COLACE) 100 MG capsule, Take 2 capsules (200 mg total) by mouth 2 (two) times daily., Disp: 10 capsule, Rfl: 0   donepezil (ARICEPT) 10 MG tablet, Take 1 tablet (10 mg total) by mouth at bedtime., Disp: 30 tablet, Rfl: 0   finasteride (PROSCAR) 5 MG tablet, Take 1 tablet (5 mg total) by mouth daily. Reported on 05/04/2016, Disp: 30 tablet, Rfl: 0   fluticasone (FLONASE) 50 MCG/ACT nasal spray, Place 1 spray into both nostrils daily as needed for allergies., Disp: 16 g, Rfl: 2   gabapentin (NEURONTIN) 400 MG capsule, Take 1 capsule (400 mg total) by mouth 2 (two) times daily., Disp: 180 capsule, Rfl: 1   losartan (COZAAR) 100 MG tablet, Take 100 mg by mouth daily., Disp: , Rfl:    Melatonin 10 MG TABS, Take 10 mg by mouth at bedtime. Per pt taking 1/2 tablet  of 10 mg., Disp: , Rfl:    meloxicam (MOBIC) 7.5 MG tablet, Take 7.5 mg by mouth 2 (two) times daily., Disp: , Rfl:    omeprazole (PRILOSEC) 20 MG capsule, Take 2 capsules (40 mg total) by mouth daily., Disp: 30 capsule, Rfl: 0   Oxycodone HCl 10 MG TABS, Take 1 tablet (10 mg total) by mouth every 4 (four) hours as needed (moderate to severe pain)., Disp: 15 tablet, Rfl: 0   polyethylene glycol (MIRALAX / GLYCOLAX) packet, Take 17 g by mouth daily., Disp: 14 each, Rfl: 0   traZODone (DESYREL) 100 MG tablet, Take 1 tablet (100 mg total) by mouth at bedtime., Disp: 30 tablet, Rfl: 0   venlafaxine XR (EFFEXOR-XR) 150 MG 24 hr capsule, Take 1 capsule (150 mg total) by mouth 2 (two) times daily., Disp: 60 capsule, Rfl: 0   Medications ordered in this encounter:  Meds ordered this encounter  Medications   sulfamethoxazole-trimethoprim (BACTRIM DS) 800-160 MG tablet    Sig: Take 1 tablet by mouth 2 (two) times daily for 7 days.    Dispense:  14 tablet    Refill:  0    Order Specific Question:   Supervising Provider    Answer:   Sabra Heck, BRIAN [[id number]]     *If you need refills on other medications prior to your next appointment, please contact your pharmacy*  Follow-Up:  Call back or seek an in-person evaluation if the symptoms worsen or if the condition fails to improve as anticipated.  Other Instructions F/u with Dr. Eulogio Ditch for urine culture.    If you have been instructed to have an in-person evaluation today at a local Urgent Care facility, please use the link below. It will take you to a list of all of our available [facility], including address, phone number and hours of operation. Please do not delay care.  [facility]  If you or a family member do not have a primary care provider, use the link below to schedule a visit and establish care. When you choose a [facility] primary care physician or advanced practice provider, you gain a long-term partner in  health. Find a Primary Care Provider  Learn more about 's in-office and virtual care options: [facility]

## 2021-12-12 NOTE — Progress Notes (Signed)
Virtual Visit Consent   Cody Velez, you are scheduled for a virtual visit with a [facility] provider today.     Just as with appointments in the office, your consent must be obtained to participate.  Your consent will be active for this visit and any virtual visit you may have with one of our providers in the next 365 days.     If you have a MyChart account, a copy of this consent can be sent to you electronically.  All virtual visits are billed to your insurance company just like a traditional visit in the office.    As this is a virtual visit, video technology does not allow for your provider to perform a traditional examination.  This may limit your provider's ability to fully assess your condition.  If your provider identifies any concerns that need to be evaluated in person or the need to arrange testing (such as labs, EKG, etc.), we will make arrangements to do so.     Although advances in technology are sophisticated, we cannot ensure that it will always work on either your end or our end.  If the connection with a video visit is poor, the visit may have to be switched to a telephone visit.  With either a video or telephone visit, we are not always able to ensure that we have a secure connection.     I need to obtain your verbal consent now.   Are you willing to proceed with your visit today?    Cody Velez has provided verbal consent on 12/12/2021 for a virtual visit (video or telephone).   Gildardo Pounds, NP   Date: 12/12/2021 2:08 PM   Virtual Visit via Video Note   I, Gildardo Pounds, connected with  Cody Velez  ([id number], June 10, 1946) on 12/12/21 at  2:15 PM EST by a video-enabled telemedicine application and verified that I am speaking with the correct person using two identifiers.  Location: Patient: Virtual Visit Location Patient: Home Provider: Virtual Visit Location Provider: Home Office   I discussed the limitations of evaluation and management by  telemedicine and the availability of in person appointments. The patient expressed understanding and agreed to proceed.    History of Present Illness: Cody Velez is a 75 y.o. who identifies as a male who was assigned male at birth, and is being seen today for UTI symptoms.  HPI:  Onset of dysuria over the past 24 hours. He is mostly confined to a wheelchair due to body habitus and chronic conditions. Uses a urinal for elimination. Notes the following symptoms of urine:  cloudy, dark, malodorous. Denies flank pain or hematuria. States he was treated for a UTI by the [place] several weeks ago with macrobid. Being followed by Urology Dr. Diona Fanti. Likely needs urine culture.   Problems:  Patient Active Problem List   Diagnosis Date Noted   Stroke [facility]) 04/29/2021   Infection of spinal cord stimulator ([place]) 02/26/2020   Pseudogout of knee    Pyogenic arthritis of right knee joint ([place])    Falls frequently 05/08/2018   Fall    Sleep disturbance    Post-op pain    Hypokalemia    Morbid obesity ([place])    Postoperative pain    DVT, lower extremity, distal, acute, bilateral (HCC)    Acute blood loss anemia    Anemia of chronic disease    Essential hypertension    Bacteremia    Myelopathy ([place]) 03/30/2018  Paraplegia (HCC)    Postlaminectomy syndrome, lumbar region    Epidural abscess    Abdominal distension    Encephalopathy    Weakness of both lower extremities    Discitis of thoracic region    Spinal cord stimulator status    Staphylococcus aureus sepsis ([place]) 03/20/2018   Bacteremia due to methicillin susceptible Staphylococcus aureus (MSSA) 03/18/2018   Obesity 03/18/2018   Nephrolithiasis 03/16/2018   AKI (acute kidney injury) ([place]) 03/16/2018   Cognitive impairment 03/16/2018   Chronic pain syndrome 03/16/2018   Cervicogenic headache 01/28/2016   B12 deficiency 01/28/2016   Memory difficulty 07/23/2015   History of cerebrovascular disease 07/23/2015   FH: colon cancer  08/13/2014   Hx of adenomatous colonic polyps 08/13/2014   Chronic back pain 05/30/2012   CAD, NATIVE VESSEL 07/17/2010   HYPERCHOLESTEROLEMIA 10/31/2009   SMOKELESS TOBACCO ABUSE 10/31/2009   Obstructive sleep apnea 10/31/2009   Essential hypertension, benign 10/31/2009   GERD 10/31/2009    Allergies: No Known Allergies Medications:  Current Outpatient Medications:    sulfamethoxazole-trimethoprim (BACTRIM DS) 800-160 MG tablet, Take 1 tablet by mouth 2 (two) times daily for 7 days., Disp: 14 tablet, Rfl: 0   alfuzosin (UROXATRAL) 10 MG 24 hr tablet, Take 10 mg by mouth daily with breakfast., Disp: , Rfl:    amLODipine (NORVASC) 5 MG tablet, Take 5 mg by mouth daily., Disp: , Rfl:    baclofen (LIORESAL) 10 MG tablet, TAKE ONE TABLET IN THE MORNING AND MIDDAY, THEN TWO TABLETS AT NIGHT, Disp: 360 each, Rfl: 3   clopidogrel (PLAVIX) 75 MG tablet, Take 1 tablet (75 mg total) by mouth daily., Disp: 30 tablet, Rfl: 2   cycloSPORINE (RESTASIS) 0.05 % ophthalmic emulsion, 1 DROP INTO BOTH EYES TWICE A DAY FOR DRY EYES, Disp: 0.4 mL, Rfl: 0   docusate sodium (COLACE) 100 MG capsule, Take 2 capsules (200 mg total) by mouth 2 (two) times daily., Disp: 10 capsule, Rfl: 0   donepezil (ARICEPT) 10 MG tablet, Take 1 tablet (10 mg total) by mouth at bedtime., Disp: 30 tablet, Rfl: 0   finasteride (PROSCAR) 5 MG tablet, Take 1 tablet (5 mg total) by mouth daily. Reported on 05/04/2016, Disp: 30 tablet, Rfl: 0   fluticasone (FLONASE) 50 MCG/ACT nasal spray, Place 1 spray into both nostrils daily as needed for allergies., Disp: 16 g, Rfl: 2   gabapentin (NEURONTIN) 400 MG capsule, Take 1 capsule (400 mg total) by mouth 2 (two) times daily., Disp: 180 capsule, Rfl: 1   losartan (COZAAR) 100 MG tablet, Take 100 mg by mouth daily., Disp: , Rfl:    Melatonin 10 MG TABS, Take 10 mg by mouth at bedtime. Per pt taking 1/2 tablet of 10 mg., Disp: , Rfl:    meloxicam (MOBIC) 7.5 MG tablet, Take 7.5 mg by mouth 2  (two) times daily., Disp: , Rfl:    omeprazole (PRILOSEC) 20 MG capsule, Take 2 capsules (40 mg total) by mouth daily., Disp: 30 capsule, Rfl: 0   Oxycodone HCl 10 MG TABS, Take 1 tablet (10 mg total) by mouth every 4 (four) hours as needed (moderate to severe pain)., Disp: 15 tablet, Rfl: 0   polyethylene glycol (MIRALAX / GLYCOLAX) packet, Take 17 g by mouth daily., Disp: 14 each, Rfl: 0   traZODone (DESYREL) 100 MG tablet, Take 1 tablet (100 mg total) by mouth at bedtime., Disp: 30 tablet, Rfl: 0   venlafaxine XR (EFFEXOR-XR) 150 MG 24 hr capsule, Take 1 capsule (150  mg total) by mouth 2 (two) times daily., Disp: 60 capsule, Rfl: 0  Observations/Objective: Patient is well-developed, well-nourished in no acute distress.  Resting comfortably  at home.  Head is normocephalic, atraumatic.  No labored breathing.  Speech is clear and coherent with logical content.  Patient is alert and oriented at baseline.    Assessment and Plan: 1. UTI symptoms - sulfamethoxazole-trimethoprim (BACTRIM DS) 800-160 MG tablet; Take 1 tablet by mouth 2 (two) times daily for 7 days.  Dispense: 14 tablet; Refill: 0 F/u with Dr. Eulogio Ditch for urine culture.   Follow Up Instructions: I discussed the assessment and treatment plan with the patient. The patient was provided an opportunity to ask questions and all were answered. The patient agreed with the plan and demonstrated an understanding of the instructions.  A copy of instructions were sent to the patient via MyChart unless otherwise noted below.     The patient was advised to call back or seek an in-person evaluation if the symptoms worsen or if the condition fails to improve as anticipated.  Time:  I spent 10 minutes with the patient via telehealth technology discussing the above problems/concerns.    Gildardo Pounds, NP

## 2021-12-14 ENCOUNTER — Encounter: Payer: Self-pay | Admitting: Nurse Practitioner

## 2022-01-07 ENCOUNTER — Encounter: Payer: Self-pay | Admitting: *Deleted

## 2022-01-08 DIAGNOSIS — R253 Fasciculation: Secondary | ICD-10-CM | POA: Insufficient documentation

## 2022-01-08 DIAGNOSIS — J343 Hypertrophy of nasal turbinates: Secondary | ICD-10-CM | POA: Insufficient documentation

## 2022-01-08 DIAGNOSIS — L729 Follicular cyst of the skin and subcutaneous tissue, unspecified: Secondary | ICD-10-CM | POA: Insufficient documentation

## 2022-01-08 DIAGNOSIS — R059 Cough, unspecified: Secondary | ICD-10-CM | POA: Insufficient documentation

## 2022-01-08 DIAGNOSIS — K036 Deposits [accretions] on teeth: Secondary | ICD-10-CM | POA: Insufficient documentation

## 2022-01-08 DIAGNOSIS — I517 Cardiomegaly: Secondary | ICD-10-CM | POA: Insufficient documentation

## 2022-01-08 DIAGNOSIS — K0252 Dental caries on pit and fissure surface penetrating into dentin: Secondary | ICD-10-CM | POA: Insufficient documentation

## 2022-01-08 DIAGNOSIS — M4727 Other spondylosis with radiculopathy, lumbosacral region: Secondary | ICD-10-CM | POA: Insufficient documentation

## 2022-01-08 DIAGNOSIS — C61 Malignant neoplasm of prostate: Secondary | ICD-10-CM | POA: Insufficient documentation

## 2022-01-08 DIAGNOSIS — G5603 Carpal tunnel syndrome, bilateral upper limbs: Secondary | ICD-10-CM | POA: Insufficient documentation

## 2022-01-18 DIAGNOSIS — R609 Edema, unspecified: Secondary | ICD-10-CM | POA: Diagnosis not present

## 2022-01-18 DIAGNOSIS — Z8673 Personal history of transient ischemic attack (TIA), and cerebral infarction without residual deficits: Secondary | ICD-10-CM | POA: Diagnosis not present

## 2022-01-18 DIAGNOSIS — I10 Essential (primary) hypertension: Secondary | ICD-10-CM | POA: Diagnosis not present

## 2022-01-24 ENCOUNTER — Ambulatory Visit
Admission: RE | Admit: 2022-01-24 | Discharge: 2022-01-24 | Disposition: A | Discharge status: Home / Self Care | Payer: Medicare Other | Source: Ambulatory Visit | Attending: Urology | Admitting: Urology

## 2022-01-24 ENCOUNTER — Other Ambulatory Visit: Payer: Self-pay

## 2022-01-24 DIAGNOSIS — C61 Malignant neoplasm of prostate: Secondary | ICD-10-CM | POA: Diagnosis not present

## 2022-01-24 MED ORDER — GADOBENATE DIMEGLUMINE 529 MG/ML IV SOLN
20.0000 mL | Freq: Once | INTRAVENOUS | Status: AC | PRN
  Administered 2022-01-24: 20 mL via INTRAVENOUS

## 2022-02-15 DIAGNOSIS — Z20822 Contact with and (suspected) exposure to covid-19: Secondary | ICD-10-CM | POA: Diagnosis not present

## 2022-02-22 DIAGNOSIS — N3000 Acute cystitis without hematuria: Secondary | ICD-10-CM | POA: Diagnosis not present

## 2022-02-25 DIAGNOSIS — Z20828 Contact with and (suspected) exposure to other viral communicable diseases: Secondary | ICD-10-CM | POA: Diagnosis not present

## 2022-02-26 ENCOUNTER — Other Ambulatory Visit: Payer: Self-pay | Admitting: Internal Medicine

## 2022-02-26 ENCOUNTER — Other Ambulatory Visit (HOSPITAL_COMMUNITY): Payer: Self-pay | Admitting: Internal Medicine

## 2022-02-26 DIAGNOSIS — Z87440 Personal history of urinary (tract) infections: Secondary | ICD-10-CM

## 2022-02-26 DIAGNOSIS — R1084 Generalized abdominal pain: Secondary | ICD-10-CM

## 2022-02-26 DIAGNOSIS — R9341 Abnormal radiologic findings on diagnostic imaging of renal pelvis, ureter, or bladder: Secondary | ICD-10-CM

## 2022-03-03 ENCOUNTER — Ambulatory Visit (INDEPENDENT_AMBULATORY_CARE_PROVIDER_SITE_OTHER): Payer: Medicare Other | Admitting: Neurology

## 2022-03-03 ENCOUNTER — Encounter: Payer: Self-pay | Admitting: Neurology

## 2022-03-03 ENCOUNTER — Other Ambulatory Visit: Payer: Self-pay

## 2022-03-03 VITALS — BP 147/69 | HR 44 | Ht 77.0 in | Wt 309.0 lb

## 2022-03-03 DIAGNOSIS — R197 Diarrhea, unspecified: Secondary | ICD-10-CM | POA: Diagnosis not present

## 2022-03-03 DIAGNOSIS — G959 Disease of spinal cord, unspecified: Secondary | ICD-10-CM | POA: Diagnosis not present

## 2022-03-03 DIAGNOSIS — I63212 Cerebral infarction due to unspecified occlusion or stenosis of left vertebral arteries: Secondary | ICD-10-CM | POA: Diagnosis not present

## 2022-03-03 DIAGNOSIS — K580 Irritable bowel syndrome with diarrhea: Secondary | ICD-10-CM | POA: Diagnosis not present

## 2022-03-03 MED ORDER — GABAPENTIN 400 MG PO CAPS: 400.0000 mg | ORAL_CAPSULE | Freq: Two times a day (BID) | ORAL | 3 refills | Status: DC

## 2022-03-03 NOTE — Progress Notes (Signed)
? ? ?PATIENT: Cody Velez ?DOB: 10-20-1946 ? ?REASON FOR VISIT: Follow up for cerebrovascular disease, gait disorder, thoracic myelopathy ?HISTORY FROM: Patient, girlfriend [name]  ?PRIMARY NEUROLOGIST: Dr. Leta Baptist ? ?ASSESSMENT AND PLAN ?76 y.o. year old male  ?1.  History of thoracic myelopathy ?2.  Gait disorder ?3.  Cerebrovascular disease ? ?-Overall stable, headaches have resolved ?-Will remain on gabapentin and baclofen ?-On Plavix for secondary stroke prevention ?-[facility] ?-Return back here in 1 year or sooner if needed, wishes to keep his neuro care here, if remains stable will transition back to PCP ? ?HISTORY OF PRESENT ILLNESS: ?Today 03/03/22 ?Cody Velez here today for follow-up.  Headaches have resolved since restarting gabapentin and baclofen.  He remains on Plavix post pontine stroke in 2022.  He has residual right hemiparesis.  He is nonambulatory, but can stand for transfers.  He is sleeping well with trazodone.  Memory is stable, has benign forgetfulness.  Remains on Aricept, reportedly took himself off Namenda.  Claims he saw a neurologist at the [place], was told he did not have Alzheimer's disease.  He developed thoracic myelopathy back 4 years ago after he became septic from a spinal stimulator.  He is planning to go to the gym tomorrow working with a trainer on his strength.  MMSE 29/30 today. ? ?HISTORY  ?09/01/2021 Dr. Jannifer Franklin: Cody Velez is a 76 year old left-handed white male with a history of a thoracic myelopathy and a gait disorder.  The patient sustained a pontine stroke earlier in 2022, he has had a mild residual right hemiparesis.  He is essentially nonambulatory at this point but he can stand for transfers.  He has altered a lot of his own medications recently, he has run out of gabapentin and reduce his baclofen dose and his trazodone dose.  He is not sleeping well, he has developed increased headaches in the back of the head and does not feel good in  general.  He has had no new sudden episodes of weakness or numbness.  Headache has been daily for him recently. ? ?REVIEW OF SYSTEMS: Out of a complete 14 system review of symptoms, the patient complains only of the following symptoms, and all other reviewed systems are negative. ? ?See HPI ? ?ALLERGIES: ?Allergies  ?Allergen Reactions  ? Lisinopril   ?  Other reaction(s): Cough  ? ? ?HOME MEDICATIONS: ?Outpatient Medications Prior to Visit  ?Medication Sig Dispense Refill  ? alfuzosin (UROXATRAL) 10 MG 24 hr tablet Take 10 mg by mouth daily with breakfast.    ? amLODipine (NORVASC) 5 MG tablet Take 5 mg by mouth daily.    ? baclofen (LIORESAL) 10 MG tablet TAKE ONE TABLET IN THE MORNING AND MIDDAY, THEN TWO TABLETS AT NIGHT 360 each 3  ? clopidogrel (PLAVIX) 75 MG tablet Take 1 tablet (75 mg total) by mouth daily. 30 tablet 2  ? cycloSPORINE (RESTASIS) 0.05 % ophthalmic emulsion 1 DROP INTO BOTH EYES TWICE A DAY FOR DRY EYES 0.4 mL 0  ? docusate sodium (COLACE) 100 MG capsule Take 2 capsules (200 mg total) by mouth 2 (two) times daily. 10 capsule 0  ? donepezil (ARICEPT) 10 MG tablet Take 1 tablet (10 mg total) by mouth at bedtime. 30 tablet 0  ? finasteride (PROSCAR) 5 MG tablet Take 1 tablet (5 mg total) by mouth daily. Reported on 05/04/2016 30 tablet 0  ? fluticasone (FLONASE) 50 MCG/ACT nasal spray Place 1 spray into both nostrils daily as needed for allergies.  16 g 2  ? losartan (COZAAR) 100 MG tablet Take 100 mg by mouth daily.    ? Melatonin 10 MG TABS Take 10 mg by mouth at bedtime. Per pt taking 1/2 tablet of 10 mg.    ? meloxicam (MOBIC) 7.5 MG tablet Take 7.5 mg by mouth 2 (two) times daily.    ? omeprazole (PRILOSEC) 20 MG capsule Take 2 capsules (40 mg total) by mouth daily. 30 capsule 0  ? Oxycodone HCl 10 MG TABS Take 1 tablet (10 mg total) by mouth every 4 (four) hours as needed (moderate to severe pain). 15 tablet 0  ? polyethylene glycol (MIRALAX / GLYCOLAX) packet Take 17 g by mouth daily. 14  each 0  ? traZODone (DESYREL) 100 MG tablet Take 1 tablet (100 mg total) by mouth at bedtime. 30 tablet 0  ? venlafaxine XR (EFFEXOR-XR) 150 MG 24 hr capsule Take 1 capsule (150 mg total) by mouth 2 (two) times daily. 60 capsule 0  ? gabapentin (NEURONTIN) 400 MG capsule Take 1 capsule (400 mg total) by mouth 2 (two) times daily. 180 capsule 1  ? ?No facility-administered medications prior to visit.  ? ? ?PAST MEDICAL HISTORY: ?Past Medical History:  ?Diagnosis Date  ? Allergic rhinitis   ? Arthritis   ? B12 deficiency   ? Cervicogenic headache   ? Childhood asthma   ? Chronic back pain   ? Coronary atherosclerosis of native coronary artery   ? Mild nonobstructive CAD at catheterization January 2015  ? Dementia ([place])   ? Depression   ? Essential hypertension   ? Falls   ? GERD (gastroesophageal reflux disease)   ? Glaucoma   ? History of blood transfusion   ? History of cerebrovascular disease 07/23/2015  ? History of kidney stones   ? History of pneumonia 02/2011  ? Hyperlipidemia   ? OSA (obstructive sleep apnea)   ? CPAP - Dr. [name] - uses cpap every night  ? Pneumonia   ? Prostate cancer ([place])   ? PTSD (post-traumatic stress disorder)   ? [place]  ? Rectal bleeding   ? Stroke [facility]) 03/2021  ? Wheelchair bound   ? ? ?PAST SURGICAL HISTORY: ?Past Surgical History:  ?Procedure Laterality Date  ? APPLICATION OF ROBOTIC ASSISTANCE FOR SPINAL PROCEDURE N/A 03/28/2018  ? Procedure: APPLICATION OF ROBOTIC ASSISTANCE FOR SPINAL PROCEDURE;  Surgeon: Kristeen Miss, MD;  Location: [facility];  Service: Neurosurgery;  Laterality: N/A;  ? BACK SURGERY  02/14/12  ? lumbar OR #7; "today redid L1L2; replaced screws; added bone from hip"  ? BILATERAL KNEE ARTHROSCOPY    ? COLONOSCOPY  10/15/2008  ? Dr. [name]: tubular adenoma   ? COLONOSCOPY  12/17/2003  ? [id number] rectal and colon  ? COLONOSCOPY N/A 09/05/2014  ? Procedure: COLONOSCOPY;  Surgeon: Daneil Dolin, MD;  [facility];  Service: Endoscopy;  Laterality: N/A;   7:30-rescheduled 9/17 to [place] notified pt  ? CYSTOSCOPY WITH STENT PLACEMENT Right 01/27/2016  ? Procedure: CYSTOSCOPY WITH STENT PLACEMENT;  Surgeon: [name], MD;  Location: [facility];  Service: Urology;  Laterality: Right;  ? CYSTOSCOPY/RETROGRADE/URETEROSCOPY/STONE EXTRACTION WITH BASKET Right 01/27/2016  ? Procedure: CYSTOSCOPY, RIGHT RETROGRADE, RIGHT URETEROSCOPY, STONE EXTRACTION ;  Surgeon: [name], MD;  Location: [facility];  Service: Urology;  Laterality: Right;  ? ESOPHAGOGASTRODUODENOSCOPY  10/15/2008    ? Dr Rourk:Schatzki's ring status post dilation and disruption via [address] dilator/ otherwise unremarkable esophagus, small hiatal hernia, multiple  fundal gland polyps not manipulated, gastritis, negative H.pylori  ? ESOPHAGOGASTRODUODENOSCOPY  06/21/02  ? [facility]:VELFY sliding hiatal hernia with mild changes of reflux esophagitis limited to gastroesophageal junction.  Noncritical ring at distal esophagus, 3 cm proximal to gastroesophageal junction/Antral gastritis  ? [place]  ? "broke face playing softball"  ? FRACTURE SURGERY    ? "left wrist; broke it; took spur off"  ? HOLMIUM LASER APPLICATION Right 1/0/1751  ? Procedure: HOLMIUM LASER APPLICATION;  Surgeon: [name], MD;  Location: [facility];  Service: Urology;  Laterality: Right;  ? KNEE ARTHROSCOPY Right 05/18/2018  ? Procedure: PARTIAL MEDIAL MENISECTOMY AND SURGICAL LAVAGE AND CHONDROPLASTY;  Surgeon: [name], MD;  Location: [facility];  Service: Orthopedics;  Laterality: Right;  ? LEFT HEART CATHETERIZATION WITH CORONARY ANGIOGRAM N/A 12/27/2013  ? Procedure: LEFT HEART CATHETERIZATION WITH CORONARY ANGIOGRAM;  Surgeon: [name], MD;  Location[facility] C[facility] LAB;  Service: Cardiovascular;  Laterality: N/A;  ? neck epidural    ? POSTERIOR LUMBAR FUSION 4 LEVEL N/A 03/28/2018  ? Procedure: Thoracic eight -Lumbar two- FIXATION WITH SCREW PLACEMENT, DECOMPRESSION Thoracic ten-Thoracic eleven  FOR  OSTEOMYELITIS;  Surgeon: Kristeen Miss, MD;  Location: [facility];  Service: Neurosurgery;  Laterality: N/A;  ? SHOULDER SURGERY Bilateral   ? SPINAL CORD STIMULATOR REMOVAL N/A 02/25/2020  ? Procedure: LUMBAR SPINAL CORD STIM

## 2022-03-05 ENCOUNTER — Ambulatory Visit (HOSPITAL_COMMUNITY)
Admission: RE | Admit: 2022-03-05 | Discharge: 2022-03-05 | Disposition: A | Discharge status: Home / Self Care | Payer: Medicare Other | Source: Ambulatory Visit | Attending: Internal Medicine | Admitting: Internal Medicine

## 2022-03-05 ENCOUNTER — Other Ambulatory Visit: Payer: Self-pay

## 2022-03-05 DIAGNOSIS — N200 Calculus of kidney: Secondary | ICD-10-CM | POA: Diagnosis not present

## 2022-03-05 DIAGNOSIS — R9341 Abnormal radiologic findings on diagnostic imaging of renal pelvis, ureter, or bladder: Secondary | ICD-10-CM | POA: Diagnosis not present

## 2022-03-05 DIAGNOSIS — N390 Urinary tract infection, site not specified: Secondary | ICD-10-CM | POA: Diagnosis not present

## 2022-03-15 DIAGNOSIS — Z20822 Contact with and (suspected) exposure to covid-19: Secondary | ICD-10-CM | POA: Diagnosis not present

## 2022-03-19 DIAGNOSIS — Z20822 Contact with and (suspected) exposure to covid-19: Secondary | ICD-10-CM | POA: Diagnosis not present

## 2022-03-23 DIAGNOSIS — Z20822 Contact with and (suspected) exposure to covid-19: Secondary | ICD-10-CM | POA: Diagnosis not present

## 2022-03-25 DIAGNOSIS — Z20822 Contact with and (suspected) exposure to covid-19: Secondary | ICD-10-CM | POA: Diagnosis not present

## 2022-04-02 DIAGNOSIS — M546 Pain in thoracic spine: Secondary | ICD-10-CM | POA: Diagnosis not present

## 2022-04-05 DIAGNOSIS — Z20822 Contact with and (suspected) exposure to covid-19: Secondary | ICD-10-CM | POA: Diagnosis not present

## 2022-04-13 DIAGNOSIS — Z20822 Contact with and (suspected) exposure to covid-19: Secondary | ICD-10-CM | POA: Diagnosis not present

## 2022-04-26 DIAGNOSIS — Z20822 Contact with and (suspected) exposure to covid-19: Secondary | ICD-10-CM | POA: Diagnosis not present

## 2022-04-27 DIAGNOSIS — M5414 Radiculopathy, thoracic region: Secondary | ICD-10-CM | POA: Diagnosis not present

## 2022-04-27 DIAGNOSIS — M5114 Intervertebral disc disorders with radiculopathy, thoracic region: Secondary | ICD-10-CM | POA: Diagnosis not present

## 2022-05-12 ENCOUNTER — Other Ambulatory Visit: Payer: Self-pay

## 2022-05-12 ENCOUNTER — Encounter: Payer: Self-pay | Admitting: Gastroenterology

## 2022-05-12 ENCOUNTER — Ambulatory Visit (INDEPENDENT_AMBULATORY_CARE_PROVIDER_SITE_OTHER): Payer: Medicare Other | Admitting: Gastroenterology

## 2022-05-12 DIAGNOSIS — Z87898 Personal history of other specified conditions: Secondary | ICD-10-CM | POA: Insufficient documentation

## 2022-05-12 DIAGNOSIS — R001 Bradycardia, unspecified: Secondary | ICD-10-CM

## 2022-05-12 DIAGNOSIS — Z86010 Personal history of colonic polyps: Secondary | ICD-10-CM | POA: Diagnosis not present

## 2022-05-12 DIAGNOSIS — R195 Other fecal abnormalities: Secondary | ICD-10-CM | POA: Diagnosis not present

## 2022-05-12 DIAGNOSIS — I2510 Atherosclerotic heart disease of native coronary artery without angina pectoris: Secondary | ICD-10-CM

## 2022-05-12 NOTE — Progress Notes (Signed)
Gastroenterology Office Note    Referring Provider: Asencion Noble, MD Primary Care Physician:  Asencion Noble, MD  Primary GI: Dr. [name]     Chief Complaint   Chief Complaint  Patient presents with   New Patient (Initial Visit)    Pt has had watery stools more than 6 weeks     History of Present Illness   Cody Velez is a 76 y.o. male presenting today at the request of Asencion Noble, MD due to history of diarrhea. Last colonoscopy in 2015 with tubular adenoma.    About 7 weeks ago started having diarrhea. March 2023: GI profile negative (including Cdiff). Getting better now. Stools are getting more formed. When first started, had no energy, didn't want to eat. No overt GI Bleeding. No abdominal pain. If didn't take Imodium, would have 2-3 watery stools per day. Some incontinence. Now having 2-3 stools per day but form is better. Appetite has not been good since.   GERD controlled on omeprazole 40 mg daily. Had been taking colace up until diarrhea started.   Not much feeling from waist down. Uses walker for very short distances.   Daughter is Knute Neu, CNM at [employer]. Enjoys watching softball games at [employer]: granddaughter is Industrial/product designer.   Pulse is 41 today. Referred to cardiology due to concern for bradycardia.   Past Medical History:  Diagnosis Date   Allergic rhinitis    Arthritis    B12 deficiency    Cervicogenic headache    Childhood asthma    Chronic back pain    Coronary atherosclerosis of native coronary artery    Mild nonobstructive CAD at catheterization January 2015   Dementia [facility])    Depression    Essential hypertension    Falls    GERD (gastroesophageal reflux disease)    Glaucoma    History of blood transfusion    History of cerebrovascular disease 07/23/2015   History of kidney stones    History of pneumonia 02/2011   Hyperlipidemia    OSA (obstructive sleep apnea)    CPAP - Dr. [name] - uses cpap every night   Pneumonia    Prostate cancer  [facility])    PTSD (post-traumatic stress disorder)    [place]   Rectal bleeding    Stroke ([place]) 03/2021   Wheelchair bound     Past Surgical History:  Procedure Laterality Date   APPLICATION OF ROBOTIC ASSISTANCE FOR SPINAL PROCEDURE N/A 03/28/2018   Procedure: APPLICATION OF ROBOTIC ASSISTANCE FOR SPINAL PROCEDURE;  Surgeon: Kristeen Miss, MD;  Location: [facility];  Service: Neurosurgery;  Laterality: N/A;   BACK SURGERY  02/14/2012   lumbar OR #7; "today redid L1L2; replaced screws; added bone from hip"   BILATERAL KNEE ARTHROSCOPY     COLONOSCOPY  10/15/2008   Dr. [name]: tubular adenoma    COLONOSCOPY  12/17/2003   [id number] rectal and colon   COLONOSCOPY N/A 09/05/2014   tubular adenoma   CYSTOSCOPY WITH STENT PLACEMENT Right 01/27/2016   Procedure: CYSTOSCOPY WITH STENT PLACEMENT;  Surgeon: [name], MD;  Location: [facility];  Service: Urology;  Laterality: Right;   CYSTOSCOPY/RETROGRADE/URETEROSCOPY/STONE EXTRACTION WITH BASKET Right 01/27/2016   Procedure: CYSTOSCOPY, RIGHT RETROGRADE, RIGHT URETEROSCOPY, STONE EXTRACTION ;  Surgeon: [name], MD;  Location: [facility];  Service: Urology;  Laterality: Right;   ESOPHAGOGASTRODUODENOSCOPY  10/15/2008   Dr Rourk:Schatzki's ring status post dilation and disruption via 74 F Maloney dilator/ otherwise unremarkable esophagus, small hiatal hernia, multiple  fundal gland polyps not manipulated, gastritis, negative H.pylori   ESOPHAGOGASTRODUODENOSCOPY  06/21/2002   YME:BRAXE sliding hiatal hernia with mild changes of reflux esophagitis limited to gastroesophageal junction.  Noncritical ring at distal esophagus, 3 cm proximal to gastroesophageal junction/Antral gastritis   FACIAL COSMETIC SURGERY  ` 1985   "broke face playing softball"   FRACTURE SURGERY     "left wrist; broke it; took spur off"   HOLMIUM LASER APPLICATION Right 94/06/6807   Procedure: HOLMIUM LASER APPLICATION;  Surgeon: [name], MD;  Location: [facility];   Service: Urology;  Laterality: Right;   KNEE ARTHROSCOPY Right 05/18/2018   Procedure: PARTIAL MEDIAL MENISECTOMY AND SURGICAL LAVAGE AND CHONDROPLASTY;  Surgeon: [name], MD;  Location: [place];  Service: Orthopedics;  Laterality: Right;   LEFT HEART CATHETERIZATION WITH CORONARY ANGIOGRAM N/A 12/27/2013   Procedure: LEFT HEART CATHETERIZATION WITH CORONARY ANGIOGRAM;  Surgeon: [name], MD;  Location: [facility] CATH [facility];  Service: Cardiovascular;  Laterality: N/A;   neck epidural     POSTERIOR LUMBAR FUSION 4 LEVEL N/A 03/28/2018   Procedure: Thoracic eight -Lumbar two- FIXATION WITH SCREW PLACEMENT, DECOMPRESSION Thoracic ten-Thoracic eleven  FOR OSTEOMYELITIS;  Surgeon: Kristeen Miss, MD;  Location: [place];  Service: Neurosurgery;  Laterality: N/A;   SHOULDER SURGERY Bilateral    SPINAL CORD STIMULATOR REMOVAL N/A 02/25/2020   Procedure: LUMBAR SPINAL CORD STIMULATOR REMOVAL;  Surgeon: Kristeen Miss, MD;  Location: [place];  Service: Neurosurgery;  Laterality: N/A;   TEE WITHOUT CARDIOVERSION N/A 03/21/2018   Procedure: TRANSESOPHAGEAL ECHOCARDIOGRAM (TEE) WITH PROPOFOL;  Surgeon: [name], MD;  [facility];  Service: Cardiovascular;  Laterality: N/A;    Current Outpatient Medications  Medication Sig Dispense Refill   alfuzosin (UROXATRAL) 10 MG 24 hr tablet Take 10 mg by mouth daily with breakfast.     amLODipine (NORVASC) 5 MG tablet Take 5 mg by mouth daily.     baclofen (LIORESAL) 10 MG tablet TAKE ONE TABLET IN THE MORNING AND MIDDAY, THEN TWO TABLETS AT NIGHT 360 each 3   clopidogrel (PLAVIX) 75 MG tablet Take 1 tablet (75 mg total) by mouth daily. 30 tablet 2   cycloSPORINE (RESTASIS) 0.05 % ophthalmic emulsion 1 DROP INTO BOTH EYES TWICE A DAY FOR DRY EYES 0.4 mL 0   docusate sodium (COLACE) 100 MG capsule Take 2 capsules (200 mg total) by mouth 2 (two) times daily. 10 capsule 0   donepezil (ARICEPT) 10 MG tablet Take 1 tablet (10 mg total) by mouth at  bedtime. 30 tablet 0   finasteride (PROSCAR) 5 MG tablet Take 1 tablet (5 mg total) by mouth daily. Reported on 05/04/2016 30 tablet 0   fluticasone (FLONASE) 50 MCG/ACT nasal spray Place 1 spray into both nostrils daily as needed for allergies. 16 g 2   gabapentin (NEURONTIN) 400 MG capsule Take 1 capsule (400 mg total) by mouth 2 (two) times daily. 180 capsule 3   losartan (COZAAR) 100 MG tablet Take 100 mg by mouth daily.     Melatonin 10 MG TABS Take 10 mg by mouth at bedtime. Per pt taking 1/2 tablet of 10 mg.     meloxicam (MOBIC) 7.5 MG tablet Take 7.5 mg by mouth 2 (two) times daily.     omeprazole (PRILOSEC) 20 MG capsule Take 2 capsules (40 mg total) by mouth daily. 30 capsule 0   Oxycodone HCl 10 MG TABS Take 1 tablet (10 mg total) by mouth every 4 (four) hours as needed (  moderate to severe pain). 15 tablet 0   polyethylene glycol (MIRALAX / GLYCOLAX) packet Take 17 g by mouth daily. 14 each 0   traZODone (DESYREL) 100 MG tablet Take 1 tablet (100 mg total) by mouth at bedtime. 30 tablet 0   venlafaxine XR (EFFEXOR-XR) 150 MG 24 hr capsule Take 1 capsule (150 mg total) by mouth 2 (two) times daily. 60 capsule 0   No current facility-administered medications for this visit.    Allergies as of 05/12/2022 - Review Complete 05/12/2022  Allergen Reaction Noted   Lisinopril  07/05/2014    Family History  Problem Relation Age of Onset   Emphysema Father    Heart failure Father    Lung cancer Father    CAD Father    Colon cancer Mother    Stroke Mother    Breast cancer Mother    Stroke Sister    Heart attack Brother    Dementia Paternal Uncle    Emphysema Maternal Grandmother    Stroke Maternal Grandmother    Asthma Other        grandson   Heart disease Paternal Grandfather    Anesthesia problems Neg Hx    Hypotension Neg Hx    Malignant hyperthermia Neg Hx    Pseudochol deficiency Neg Hx     Social History   Socioeconomic History   Marital status: Widowed    Spouse  name: Rise Paganini   Number of children: 3   Years of education: Not on file   Highest education level: Not on file  Occupational History   Occupation: Print production planner    Employer: [employer]  Tobacco Use   Smoking status: Former    Types: Cigarettes    Quit date: 12/20/1958    Years since quitting: 63.4   Smokeless tobacco: Current    Types: Chew  Vaping Use   Vaping Use: Never used  Substance and Sexual Activity   Alcohol use: Not Currently    Alcohol/week: 0.0 standard drinks    Comment: couple bottles of wine per month   Drug use: No    Types: Marijuana    Comment: "last used marijuana ~ 1969"   Sexual activity: Not on file  Other Topics Concern   Not on file  Social History Narrative   Married, 3 daughters; retired   Patient drinks about 1 cup of coffee daily.   Patient is left handed.   Social Determinants of Health   Financial Resource Strain: Not on file  Food Insecurity: Not on file  Transportation Needs: Not on file  Physical Activity: Not on file  Stress: Not on file  Social Connections: Not on file  Intimate Partner Violence: Not on file     Review of Systems   Gen: Denies any fever, chills, fatigue, weight loss, lack of appetite.  CV: Denies chest pain, heart palpitations, peripheral edema, syncope.  Resp: Denies shortness of breath at rest or with exertion. Denies wheezing or cough.  GI: see HPI GU : Denies urinary burning, urinary frequency, urinary hesitancy MS: Denies joint pain, muscle weakness, cramps, or limitation of movement.  Derm: Denies rash, itching, dry skin Psych: Denies depression, anxiety, memory loss, and confusion Heme: Denies bruising, bleeding, and enlarged lymph nodes.   Physical Exam   BP 110/70   Pulse (!) 41   Temp 98.9 F (37.2 C)   Ht '6\' 3"'$  (1.905 m)   BMI 38.62 kg/m  General:   Alert and oriented. Pleasant and cooperative. Well-nourished and well-developed.  Head:  Normocephalic and atraumatic. Eyes:  Without icterus Ears:  Normal  auditory acuity. Lungs:  Clear to auscultation bilaterally.  Heart:  S1, S2 present without murmurs appreciated.  Abdomen:  +BS, soft, non-tender and non-distended. No HSM noted. Sitting in wheelchair Rectal:  Deferred  Msk:  Symmetrical without gross deformities. Normal posture. Extremities:  Without edema. Neurologic:  Alert and  oriented x4;  grossly normal neurologically. Skin:  Intact without significant lesions or rashes. Psych:  Alert and cooperative. Normal mood and affect.   Assessment   Cody Velez is a 76 y.o. male presenting today  at the request of Asencion Noble, MD due to history of diarrhea. Last colonoscopy in 2015 with tubular adenoma.   He notes diarrhea onset about 7 weeks ago; stool studies including Cdiff were negative. He has had improvement sine that time with better formation of stool. I suspect he is dealing moreso now with post-infectious IBS.   As his last colonoscopy was in 2015, will need surveillance colonoscopy in near future.  GERD controlled on omeprazole once daily.  I noticed his heart rate was 41 today. We referred him to Cardiology, whereby he was seen on 5/31. EKG that day with atrial bigeminy and heart rate 75. It was felt that monitor not picking up atrial bigeminy pattern. No further cardiac testing planned. Low risk to proceed.      PLAN    Add probiotic  Call if diarrhea recurs  Proceed with colonoscopy by Dr. [name] in near future: the risks, benefits, and alternatives have been discussed with the patient in detail. The patient states understanding and desires to proceed.  Patient will continue Plavix.    Annitta Needs, PhD, ANP-BC [facility]

## 2022-05-12 NOTE — Patient Instructions (Addendum)
I am glad you are doing better!  Please call if diarrhea recurs. I feel you likely have what's called post-infectious irritable bowel syndrome. It's a good thing that you are improving!  I recommend taking a probiotic like Digestive Advantage, [employer] Colon Health, Align, or Restora daily.  We are referring you to Cardiology due to low heart rate.  Then, we can schedule the colonoscopy!  It was a pleasure to see you today. I want to create trusting relationships with patients to provide genuine, compassionate, and quality care. I value your feedback. If you receive a survey regarding your visit,  I greatly appreciate you taking time to fill this out.   Annitta Needs, PhD, ANP-BC [facility] Gastroenterology

## 2022-05-12 NOTE — Progress Notes (Unsigned)
B re 

## 2022-05-14 ENCOUNTER — Ambulatory Visit (HOSPITAL_BASED_OUTPATIENT_CLINIC_OR_DEPARTMENT_OTHER): Payer: Medicare Other | Admitting: Family

## 2022-05-18 NOTE — Progress Notes (Unsigned)
Cardiology Office Note:    Date:  05/19/2022   ID:  Cody Velez, DOB [birth year], MRN [id number]  PCP:  Asencion Noble, [place] Cardiologist: Rozann Lesches, MD   Reason for visit: Bradycradia  History of Present Illness:    Cody Velez is a 76 y.o. male with a hx of pontine stroke in 2022 with residual right hemiparesis, nonambulatory, thoracic myelopathy following sepsis from spinal stimulator, CAD (s/p cath in 12/2013 showing mild nonobstructive CAD, HTN, HLD and OSA.    Last saw Cody Velez in August 2022 and had no concerning chest pain.  He called for an appointment secondary to low heart rate at GI office visits.  Prior EKGs show sinus rhythm with PACs/atrial bigeminy and left anterior fascicular block.  Today, patient states he has had diarrhea for 2 months and therefore GI is planning colonoscopy.  They want him to have a cardiac evaluation for heart rates in the high 30s and low 40s.  Patient denies palpitations, lightheadedness and syncope.  No change in his breathing.  No chest pain, PND or orthopnea.  He has chronic mild lower extremity edema secondary to sedentary lifestyle.  Patient can walk with a walker and do water aerobics.  He is accompanied by his caregiver who lives with him.  He has chronic fatigue.     Past Medical History:  Diagnosis Date   Allergic rhinitis    Arthritis    B12 deficiency    Cervicogenic headache    Childhood asthma    Chronic back pain    Coronary atherosclerosis of native coronary artery    Mild nonobstructive CAD at catheterization January 2015   Dementia [facility]    Depression    Essential hypertension    Falls    GERD (gastroesophageal reflux disease)    Glaucoma    History of blood transfusion    History of cerebrovascular disease 07/23/2015   History of kidney stones    History of pneumonia 02/2011   Hyperlipidemia    OSA (obstructive sleep apnea)    CPAP - Dr. [name] - uses cpap every night    Pneumonia    Prostate cancer Cody Velez)    PTSD (post-traumatic stress disorder)    [place]   Rectal bleeding    Stroke ([place]) 03/2021   Wheelchair bound     Past Surgical History:  Procedure Laterality Date   APPLICATION OF ROBOTIC ASSISTANCE FOR SPINAL PROCEDURE N/A 03/28/2018   Procedure: APPLICATION OF ROBOTIC ASSISTANCE FOR SPINAL PROCEDURE;  Surgeon: Kristeen Miss, MD;  Location: [place];  Service: Neurosurgery;  Laterality: N/A;   BACK SURGERY  02/14/2012   lumbar OR #7; "today redid L1L2; replaced screws; added bone from hip"   BILATERAL KNEE ARTHROSCOPY     COLONOSCOPY  10/15/2008   Dr. [name]: tubular adenoma    COLONOSCOPY  12/17/2003   MEQ:ASTMHD rectal and colon   COLONOSCOPY N/A 09/05/2014   tubular adenoma   CYSTOSCOPY WITH STENT PLACEMENT Right 01/27/2016   Procedure: CYSTOSCOPY WITH STENT PLACEMENT;  Surgeon: [name], MD;  [facility];  Service: Urology;  Laterality: Right;   CYSTOSCOPY/RETROGRADE/URETEROSCOPY/STONE EXTRACTION WITH BASKET Right 01/27/2016   Procedure: CYSTOSCOPY, RIGHT RETROGRADE, RIGHT URETEROSCOPY, STONE EXTRACTION ;  Surgeon: [name], MD;  Location: [facility];  Service: Urology;  Laterality: Right;   ESOPHAGOGASTRODUODENOSCOPY  10/15/2008   Dr Rourk:Schatzki's ring status post dilation and disruption via 1 F Maloney dilator/ otherwise unremarkable esophagus, small hiatal hernia, multiple fundal gland polyps not  manipulated, gastritis, negative H.pylori   ESOPHAGOGASTRODUODENOSCOPY  06/21/2002   LOV:FIEPP sliding hiatal hernia with mild changes of reflux esophagitis limited to gastroesophageal junction.  Noncritical ring at distal esophagus, 3 cm proximal to gastroesophageal junction/Antral gastritis   FACIAL COSMETIC SURGERY  ` 1985   "broke face playing softball"   FRACTURE SURGERY     "left wrist; broke it; took spur off"   HOLMIUM LASER APPLICATION Right 29/51/8841   Procedure: HOLMIUM LASER APPLICATION;  Surgeon: [name], MD;  Location: [facility];  Service: Urology;  Laterality: Right;   KNEE ARTHROSCOPY Right 05/18/2018   Procedure: PARTIAL MEDIAL MENISECTOMY AND SURGICAL LAVAGE AND CHONDROPLASTY;  Surgeon: [name], MD;  Location: [place];  Service: Orthopedics;  Laterality: Right;   LEFT HEART CATHETERIZATION WITH CORONARY ANGIOGRAM N/A 12/27/2013   Procedure: LEFT HEART CATHETERIZATION WITH CORONARY ANGIOGRAM;  Surgeon: [name], MD;  Location: [facility];  Service: Cardiovascular;  Laterality: N/A;   neck epidural     POSTERIOR LUMBAR FUSION 4 LEVEL N/A 03/28/2018   Procedure: Thoracic eight -Lumbar two- FIXATION WITH SCREW PLACEMENT, DECOMPRESSION Thoracic ten-Thoracic eleven  FOR OSTEOMYELITIS;  Surgeon: Kristeen Miss, MD;  Location: [place];  Service: Neurosurgery;  Laterality: N/A;   SHOULDER SURGERY Bilateral    SPINAL CORD STIMULATOR REMOVAL N/A 02/25/2020   Procedure: LUMBAR SPINAL CORD STIMULATOR REMOVAL;  Surgeon: Kristeen Miss, MD;  Location: [place];  Service: Neurosurgery;  Laterality: N/A;   TEE WITHOUT CARDIOVERSION N/A 03/21/2018   Procedure: TRANSESOPHAGEAL ECHOCARDIOGRAM (TEE) WITH PROPOFOL;  Surgeon: [name], MD;  [facility];  Service: Cardiovascular;  Laterality: N/A;    Current Medications: Current Meds  Medication Sig   alfuzosin (UROXATRAL) 10 MG 24 hr tablet Take 10 mg by mouth daily with breakfast.   amLODipine (NORVASC) 5 MG tablet Take 5 mg by mouth daily.   azelastine (ASTELIN) 0.1 % nasal spray Place into both nostrils 2 (two) times daily. Use in each nostril as directed   baclofen (LIORESAL) 10 MG tablet TAKE ONE TABLET IN THE MORNING AND MIDDAY, THEN TWO TABLETS AT NIGHT   clopidogrel (PLAVIX) 75 MG tablet Take 1 tablet (75 mg total) by mouth daily.   cycloSPORINE (RESTASIS) 0.05 % ophthalmic emulsion 1 DROP INTO BOTH EYES TWICE A DAY FOR DRY EYES   docusate sodium (COLACE) 100 MG capsule Take 2 capsules (200 mg total) by mouth 2 (two)  times daily.   donepezil (ARICEPT) 10 MG tablet Take 1 tablet (10 mg total) by mouth at bedtime.   finasteride (PROSCAR) 5 MG tablet Take 1 tablet (5 mg total) by mouth daily. Reported on 05/04/2016   fluticasone (FLONASE) 50 MCG/ACT nasal spray Place 1 spray into both nostrils daily as needed for allergies.   gabapentin (NEURONTIN) 400 MG capsule Take 1 capsule (400 mg total) by mouth 2 (two) times daily.   losartan (COZAAR) 100 MG tablet Take 100 mg by mouth daily.   Melatonin 10 MG TABS Take 10 mg by mouth at bedtime. Per pt taking 1/2 tablet of 10 mg.   omeprazole (PRILOSEC) 20 MG capsule Take 2 capsules (40 mg total) by mouth daily.   Oxycodone HCl 10 MG TABS Take 1 tablet (10 mg total) by mouth every 4 (four) hours as needed (moderate to severe pain).   rosuvastatin (CRESTOR) 20 MG tablet Take 20 mg by mouth daily.   traZODone (DESYREL) 100 MG tablet Take 1 tablet (100 mg total) by mouth at bedtime.  venlafaxine XR (EFFEXOR-XR) 150 MG 24 hr capsule Take 1 capsule (150 mg total) by mouth 2 (two) times daily.     Allergies:   Lisinopril   Social History   Socioeconomic History   Marital status: Widowed    Spouse name: Rise Paganini   Number of children: 3   Years of education: Not on file   Highest education level: Not on file  Occupational History   Occupation: Print production planner    Employer: [employer]  Tobacco Use   Smoking status: Former    Types: Cigarettes    Quit date: 12/20/1958    Years since quitting: 63.4   Smokeless tobacco: Current    Types: Chew  Vaping Use   Vaping Use: Never used  Substance and Sexual Activity   Alcohol use: Not Currently    Alcohol/week: 0.0 standard drinks    Comment: couple bottles of wine per month   Drug use: No    Comment: "last used marijuana ~ 1969"   Sexual activity: Not on file  Other Topics Concern   Not on file  Social History Narrative   Married, 3 daughters; retired   Patient drinks about 1 cup of coffee daily.   Patient is left handed.   Social  Determinants of Health   Financial Resource Strain: Not on file  Food Insecurity: Not on file  Transportation Needs: Not on file  Physical Activity: Not on file  Stress: Not on file  Social Connections: Not on file     Family History: The patient's family history includes Asthma in an other family member; Breast cancer in his mother; CAD in his father; Colon cancer in his mother; Dementia in his paternal uncle; Emphysema in his father and maternal grandmother; Heart attack in his brother; Heart disease in his paternal grandfather; Heart failure in his father; Lung cancer in his father; Stroke in his maternal grandmother, mother, and sister. There is no history of Anesthesia problems, Hypotension, Malignant hyperthermia, or Pseudochol deficiency.  ROS:   Please see the history of present illness.     EKGs/Labs/Other Studies Reviewed:    EKG:  The ekg ordered today demonstrates sinus rhythm with premature atrial complexes and pattern of bigeminy, left axis deviation, heart rate 75.  Recent Labs: No results found for requested labs within last 8760 hours.   Recent Lipid Panel Lab Results  Component Value Date/Time   CHOL 175 05/30/2012 07:13 PM   TRIG 194 (H) 05/30/2012 07:13 PM   HDL 28 (L) 05/30/2012 07:13 PM   LDLCALC 108 (H) 05/30/2012 07:13 PM    Physical Exam:    VS:  BP 135/72   Pulse 75   Ht '6\' 3"'$  (1.905 m)   Wt (!) 313 lb (142 kg)   SpO2 97%   BMI 39.12 kg/m    No data found.  Wt Readings from Last 3 Encounters:  05/19/22 (!) 313 lb (142 kg)  03/03/22 (!) 309 lb (140.2 kg)  09/01/21 (!) 309 lb (140.2 kg)     GEN: Obese male in no acute distress; in motorized chair HEENT: Normal NECK: No JVD; No carotid bruits CARDIAC: RRR with ectopy (in bigeminal pattern) RESPIRATORY:  Clear to auscultation without rales, wheezing or rhonchi  ABDOMEN: Soft, non-tender, non-distended MUSCULOSKELETAL: 1+ edema to mid shins bilaterally SKIN: Warm and dry NEUROLOGIC:  Alert  and oriented PSYCHIATRIC:  Normal affect    ASSESSMENT AND PLAN   Atrial bigeminy Concern for bradycardia -It sounds like prior heart rate recording at GI office  is likely not picking up his atrial bigeminy pattern.  Likely only picking up half of his actual heart rate.  EKG today shows sinus rhythm with atrial bigeminy with a heart rate 75.  His smart watch also picks up heart rate 75. -He is not on any AV nodal blocking agents. -Continue a 3-day Zio patch to confirm no significant heart arrhythmias/bradycardia. -Patient is low risk from a cardiac standpoint to proceed with planned colonoscopy.    Coronary artery disease with no angina -cath in 12/2013 showing mild nonobstructive CAD with low-risk NST's in 2016 and 02/2018. -Continue Plavix (replaced aspirin post stroke)  Hypertension, reasonably controlled -Continue amlodipine and losartan. -Goal BP is <130/80.    Hyperlipidemia -Continue Crestor.  Disposition - Follow-up in 6 months with Dr. Domenic Polite in [place].   Medication Adjustments/Labs and Tests Ordered: Current medicines are reviewed at length with the patient today.  Concerns regarding medicines are outlined above.  Orders Placed This Encounter  Procedures   LONG TERM MONITOR (3-14 DAYS)   EKG 12-Lead   No orders of the defined types were placed in this encounter.   Patient Instructions  Medication Instructions:  No changes *If you need a refill on your cardiac medications before your next appointment, please call your pharmacy*   Lab Work: No Labs If you have labs (blood work) drawn today and your tests are completely normal, you will receive your results only by: [place] (if you have MyChart) OR A paper copy in the mail If you have any lab test that is abnormal or we need to change your treatment, we will call you to review the results.   Testing/Procedures: [place]- Long Term Monitor Instructions  Your physician has requested you wear a ZIO patch  monitor for 3 days.  This is a single patch monitor. [employer] supplies one patch monitor per enrollment. Additional stickers are not available. Please do not apply patch if you will be having a Nuclear Stress Test,  Echocardiogram, Cardiac CT, MRI, or Chest Xray during the period you would be wearing the  monitor. The patch cannot be worn during these tests. You cannot remove and re-apply the  ZIO XT patch monitor.  Your ZIO patch monitor will be mailed 3 day USPS to your address on file. It may take 3-5 days  to receive your monitor after you have been enrolled.  Once you have received your monitor, please review the enclosed instructions. Your monitor  has already been registered assigning a specific monitor serial # to you.  Billing and Patient Assistance Program Information  We have supplied [employer] with any of your insurance information on file for billing purposes. [employer] offers a sliding scale Patient Assistance Program for patients that do not have  insurance, or whose insurance does not completely cover the cost of the ZIO monitor.  You must apply for the Patient Assistance Program to qualify for this discounted rate.  To apply, please call [employer] at [phone], select option 4, select option 2, ask to apply for  Patient Assistance Program. Theodore Demark will ask your household income, and how many people  are in your household. They will quote your out-of-pocket cost based on that information.  [employer] will also be able to set up a 30-month interest-free payment plan if needed.  Applying the monitor   Shave hair from upper left chest.  Hold abrader disc by orange tab. Rub abrader in 40 strokes over the upper left chest as  indicated in your monitor instructions.  Clean area with 4 enclosed alcohol pads. Let dry.  Apply patch as indicated in monitor instructions. Patch will be placed under collarbone on left  side of chest with arrow pointing upward.  Rub patch adhesive wings for  2 minutes. Remove white label marked "1". Remove the white  label marked "2". Rub patch adhesive wings for 2 additional minutes.  While looking in a mirror, press and release button in [place] of patch. A small green light will  flash 3-4 times. This will be your only indicator that the monitor has been turned on.  Do not shower for the first 24 hours. You may shower after the first 24 hours.  Press the button if you feel a symptom. You will hear a small click. Record Date, Time and  Symptom in the Patient Logbook.  When you are ready to remove the patch, follow instructions on the last 2 pages of Patient  Logbook. Stick patch monitor onto the last page of Patient Logbook.  Place Patient Logbook in the blue and white box. Use locking tab on box and tape box closed  securely. The blue and white box has prepaid postage on it. Please place it in the mailbox as  soon as possible. Your physician should have your test results approximately 7 days after the  monitor has been mailed back to [facility].  Call [place] at [phone] if you have questions regarding  your ZIO XT patch monitor. Call them immediately if you see an orange light blinking on your  monitor.  If your monitor falls off in less than 4 days, contact our Monitor department at [phone].  If your monitor becomes loose or falls off after 4 days call [employer] at [phone] for  suggestions on securing your monitor    Follow-Up: At [facility], you and your health needs are our priority.  As part of our continuing mission to provide you with exceptional heart care, we have created designated Provider Care Teams.  These Care Teams include your primary Cardiologist (physician) and Advanced Practice Providers (APPs -  Physician Assistants and Nurse Practitioners) who all work together to provide you with the care you need, when you need it.  We recommend signing up for the patient portal called  "MyChart".  Sign up information is provided on this After Visit Summary.  MyChart is used to connect with patients for Virtual Visits (Telemedicine).  Patients are able to view lab/test results, encounter notes, upcoming appointments, etc.  Non-urgent messages can be sent to your provider as well.   To learn more about what you can do with MyChart, go to [email or web address].    Your next appointment:   6 month(s)  The format for your next appointment:   In Person  Provider:   Rozann Lesches, MD      Important Information About Sugar         Signed, Warren Lacy, PA-C  05/19/2022 11:29 AM    [place]

## 2022-05-19 ENCOUNTER — Ambulatory Visit (INDEPENDENT_AMBULATORY_CARE_PROVIDER_SITE_OTHER): Payer: Medicare Other | Admitting: Physician Assistant

## 2022-05-19 ENCOUNTER — Encounter: Payer: Self-pay | Admitting: Physician Assistant

## 2022-05-19 ENCOUNTER — Ambulatory Visit (INDEPENDENT_AMBULATORY_CARE_PROVIDER_SITE_OTHER): Payer: Medicare Other

## 2022-05-19 VITALS — BP 135/72 | HR 75 | Ht 75.0 in | Wt 313.0 lb

## 2022-05-19 DIAGNOSIS — I498 Other specified cardiac arrhythmias: Secondary | ICD-10-CM | POA: Diagnosis not present

## 2022-05-19 DIAGNOSIS — R001 Bradycardia, unspecified: Secondary | ICD-10-CM

## 2022-05-19 DIAGNOSIS — I10 Essential (primary) hypertension: Secondary | ICD-10-CM | POA: Diagnosis not present

## 2022-05-19 DIAGNOSIS — I2510 Atherosclerotic heart disease of native coronary artery without angina pectoris: Secondary | ICD-10-CM

## 2022-05-19 NOTE — Patient Instructions (Addendum)
Medication Instructions:  No changes *If you need a refill on your cardiac medications before your next appointment, please call your pharmacy*   Lab Work: No Labs If you have labs (blood work) drawn today and your tests are completely normal, you will receive your results only by: [place] (if you have MyChart) OR A paper copy in the mail If you have any lab test that is abnormal or we need to change your treatment, we will call you to review the results.   Testing/Procedures: Cody Velez- Long Term Monitor Instructions  Your physician has requested you wear a ZIO patch monitor for 3 days.  This is a single patch monitor. [employer] supplies one patch monitor per enrollment. Additional stickers are not available. Please do not apply patch if you will be having a Nuclear Stress Test,  Echocardiogram, Cardiac CT, MRI, or Chest Xray during the period you would be wearing the  monitor. The patch cannot be worn during these tests. You cannot remove and re-apply the  ZIO XT patch monitor.  Your ZIO patch monitor will be mailed 3 day USPS to your address on file. It may take 3-5 days  to receive your monitor after you have been enrolled.  Once you have received your monitor, please review the enclosed instructions. Your monitor  has already been registered assigning a specific monitor serial # to you.  Billing and Patient Assistance Program Information  We have supplied [employer] with any of your insurance information on file for billing purposes. [employer] offers a sliding scale Patient Assistance Program for patients that do not have  insurance, or whose insurance does not completely cover the cost of the ZIO monitor.  You must apply for the Patient Assistance Program to qualify for this discounted rate.  To apply, please call [employer] at [phone], select option 4, select option 2, ask to apply for  Patient Assistance Program. Cody Velez will ask your household income, and how many people   are in your household. They will quote your out-of-pocket cost based on that information.  [employer] will also be able to set up a 26-month interest-free payment plan if needed.  Applying the monitor   Shave hair from upper left chest.  Hold abrader disc by orange tab. Rub abrader in 40 strokes over the upper left chest as  indicated in your monitor instructions.  Clean area with 4 enclosed alcohol pads. Let dry.  Apply patch as indicated in monitor instructions. Patch will be placed under collarbone on left  side of chest with arrow pointing upward.  Rub patch adhesive wings for 2 minutes. Remove white label marked "1". Remove the white  label marked "2". Rub patch adhesive wings for 2 additional minutes.  While looking in a mirror, press and release button in center of patch. A small green light will  flash 3-4 times. This will be your only indicator that the monitor has been turned on.  Do not shower for the first 24 hours. You may shower after the first 24 hours.  Press the button if you feel a symptom. You will hear a small click. Record Date, Time and  Symptom in the Patient Logbook.  When you are ready to remove the patch, follow instructions on the last 2 pages of Patient  Logbook. Stick patch monitor onto the last page of Patient Logbook.  Place Patient Logbook in the blue and white box. Use locking tab on box and tape box closed  securely. The blue and white box  has prepaid postage on it. Please place it in the mailbox as  soon as possible. Your physician should have your test results approximately 7 days after the  monitor has been mailed back to [facility].  Call [place] at [phone] if you have questions regarding  your ZIO XT patch monitor. Call them immediately if you see an orange light blinking on your  monitor.  If your monitor falls off in less than 4 days, contact our Monitor department at [phone].  If your monitor becomes loose or  falls off after 4 days call [employer] at [phone] for  suggestions on securing your monitor    Follow-Up: At [facility], you and your health needs are our priority.  As part of our continuing mission to provide you with exceptional heart care, we have created designated Provider Care Teams.  These Care Teams include your primary Cardiologist (physician) and Advanced Practice Providers (APPs -  Physician Assistants and Nurse Practitioners) who all work together to provide you with the care you need, when you need it.  We recommend signing up for the patient portal called "MyChart".  Sign up information is provided on this After Visit Summary.  MyChart is used to connect with patients for Virtual Visits (Telemedicine).  Patients are able to view lab/test results, encounter notes, upcoming appointments, etc.  Non-urgent messages can be sent to your provider as well.   To learn more about what you can do with MyChart, go to [email or web address].    Your next appointment:   6 month(s)  The format for your next appointment:   In Person  Provider:   Rozann Lesches, MD      Important Information About Sugar

## 2022-05-19 NOTE — Progress Notes (Unsigned)
Enrolled for [employer] to mail a ZIO XT long term holter monitor to the patients address on file.   Dr. Sam McDowell to read. 

## 2022-05-21 ENCOUNTER — Telehealth: Payer: Self-pay

## 2022-05-21 NOTE — Telephone Encounter (Signed)
CARDIAC CLEARANCE FOR:  Cody Velez  (DOB 1946-09-01)   Patient is having a colonoscopy  ASA III with Dr Daneil Dolin.  (Dx: Hx of polyps).  The date has not been determined  Anesthesia to be used: Propofol

## 2022-05-22 DIAGNOSIS — R001 Bradycardia, unspecified: Secondary | ICD-10-CM | POA: Diagnosis not present

## 2022-05-22 DIAGNOSIS — I2510 Atherosclerotic heart disease of native coronary artery without angina pectoris: Secondary | ICD-10-CM

## 2022-05-22 DIAGNOSIS — I10 Essential (primary) hypertension: Secondary | ICD-10-CM

## 2022-06-01 DIAGNOSIS — R001 Bradycardia, unspecified: Secondary | ICD-10-CM | POA: Diagnosis not present

## 2022-06-01 DIAGNOSIS — I2510 Atherosclerotic heart disease of native coronary artery without angina pectoris: Secondary | ICD-10-CM | POA: Diagnosis not present

## 2022-06-01 DIAGNOSIS — I10 Essential (primary) hypertension: Secondary | ICD-10-CM | POA: Diagnosis not present

## 2022-06-01 NOTE — Telephone Encounter (Signed)
noted 

## 2022-06-01 NOTE — Telephone Encounter (Signed)
Will call pt once we have future schedule

## 2022-06-01 NOTE — Telephone Encounter (Signed)
I spoke with this pt and he stated his diarrhea is worse and what you recommended for him to try (probiotics) isn't working. He had a accident getting out of bed and getting in the car from shopping he had another accident. Pt states he has had this for 2 months. He cannot control it. Please advise.

## 2022-06-01 NOTE — Telephone Encounter (Signed)
[facility] clinical pool:  Patient was seen by Cardiology on 5/31. Low risk for colonoscopy.  Please go ahead and arrange colonoscopy with Dr. [name], history of polyps. ASA 3.

## 2022-06-02 MED ORDER — DICYCLOMINE HCL 10 MG PO CAPS: 10.0000 mg | ORAL_CAPSULE | Freq: Three times a day (TID) | ORAL | 3 refills | Status: DC

## 2022-06-02 NOTE — Telephone Encounter (Signed)
Pt returned call and I advised him of the Rx being sent in with instructions and warnings. Pt will call us back in 2 weeks with a progress report. Pt expressed understanding

## 2022-06-02 NOTE — Telephone Encounter (Signed)
Phoned and LMOVM for the pt to return call regarding Rx.

## 2022-06-02 NOTE — Addendum Note (Signed)
Addended by: Annitta Needs on: 06/02/2022 01:08 PM   Modules accepted: Orders

## 2022-06-02 NOTE — Telephone Encounter (Signed)
I am sending in dicyclomine to take no more than 4 times a day for loose stools. Side effects can include constipation, dry mouth, dizziness, and confusion. Start off with no more than taking twice a day for now. Stop if any adverse symptoms.    Call with progress report in 2 weeks.

## 2022-06-17 MED ORDER — PEG 3350-KCL-NA BICARB-NACL 420 G PO SOLR: ORAL | 0 refills | Status: DC

## 2022-06-17 NOTE — Telephone Encounter (Signed)
LMOVM to call back to schedule procedure

## 2022-06-17 NOTE — Addendum Note (Signed)
Addended by: Cheron Every on: 06/17/2022 04:26 PM   Modules accepted: Orders

## 2022-06-17 NOTE — Telephone Encounter (Signed)
Spoke with pt. Scheduled for 8/9. Aware will mail prep instructions/pre-op appt. Rx sent in.

## 2022-06-18 ENCOUNTER — Encounter: Payer: Self-pay | Admitting: *Deleted

## 2022-06-18 ENCOUNTER — Telehealth: Payer: Self-pay

## 2022-06-18 NOTE — Telephone Encounter (Addendum)
Called patient regarding results. Left detailed message for patient regarding results.----- Message from Warren Lacy, PA-C sent at 06/16/2022  2:59 PM EDT ----- 3-day monitor showed normal sinus rhythm with frequent premature atrial beats. Predominant rhythm is sinus with heart rate ranging from 57 bpm up to 115 bpm and average heart rate 83 bpm.   No evidence of bradycardia or concerning slow rhythms.

## 2022-07-26 ENCOUNTER — Encounter (HOSPITAL_COMMUNITY)
Admission: RE | Admit: 2022-07-26 | Discharge: 2022-07-26 | Disposition: A | Discharge status: Home / Self Care | Payer: Medicare Other | Source: Ambulatory Visit | Attending: Internal Medicine | Admitting: Internal Medicine

## 2022-07-28 ENCOUNTER — Telehealth: Payer: Self-pay | Admitting: *Deleted

## 2022-07-28 ENCOUNTER — Encounter (HOSPITAL_COMMUNITY): Payer: Self-pay | Admitting: Internal Medicine

## 2022-07-28 ENCOUNTER — Ambulatory Visit (HOSPITAL_COMMUNITY)
Admission: RE | Admit: 2022-07-28 | Discharge: 2022-07-28 | Disposition: A | Discharge status: Home / Self Care | Payer: Medicare Other | Attending: Internal Medicine | Admitting: Internal Medicine

## 2022-07-28 ENCOUNTER — Encounter (HOSPITAL_COMMUNITY)
Admission: RE | Disposition: A | Discharge status: Home / Self Care | Payer: Self-pay | Source: Home / Self Care | Attending: Internal Medicine

## 2022-07-28 ENCOUNTER — Ambulatory Visit (HOSPITAL_COMMUNITY): Payer: Medicare Other | Admitting: Anesthesiology

## 2022-07-28 ENCOUNTER — Other Ambulatory Visit: Payer: Self-pay

## 2022-07-28 ENCOUNTER — Ambulatory Visit (HOSPITAL_BASED_OUTPATIENT_CLINIC_OR_DEPARTMENT_OTHER): Payer: Medicare Other | Admitting: Anesthesiology

## 2022-07-28 DIAGNOSIS — I2510 Atherosclerotic heart disease of native coronary artery without angina pectoris: Secondary | ICD-10-CM | POA: Diagnosis not present

## 2022-07-28 DIAGNOSIS — Z86010 Personal history of colonic polyps: Secondary | ICD-10-CM

## 2022-07-28 DIAGNOSIS — Z87891 Personal history of nicotine dependence: Secondary | ICD-10-CM

## 2022-07-28 DIAGNOSIS — Z538 Procedure and treatment not carried out for other reasons: Secondary | ICD-10-CM | POA: Diagnosis not present

## 2022-07-28 DIAGNOSIS — K219 Gastro-esophageal reflux disease without esophagitis: Secondary | ICD-10-CM | POA: Diagnosis not present

## 2022-07-28 DIAGNOSIS — F0393 Unspecified dementia, unspecified severity, with mood disturbance: Secondary | ICD-10-CM | POA: Insufficient documentation

## 2022-07-28 DIAGNOSIS — Z1211 Encounter for screening for malignant neoplasm of colon: Secondary | ICD-10-CM | POA: Diagnosis not present

## 2022-07-28 DIAGNOSIS — F418 Other specified anxiety disorders: Secondary | ICD-10-CM | POA: Insufficient documentation

## 2022-07-28 DIAGNOSIS — I10 Essential (primary) hypertension: Secondary | ICD-10-CM | POA: Insufficient documentation

## 2022-07-28 DIAGNOSIS — G4730 Sleep apnea, unspecified: Secondary | ICD-10-CM | POA: Diagnosis not present

## 2022-07-28 HISTORY — PX: FLEXIBLE SIGMOIDOSCOPY: SHX5431

## 2022-07-28 SURGERY — SIGMOIDOSCOPY, FLEXIBLE
Anesthesia: General

## 2022-07-28 MED ORDER — LACTATED RINGERS IV SOLN: INTRAVENOUS | Status: DC

## 2022-07-28 MED ORDER — PROPOFOL 10 MG/ML IV BOLUS
INTRAVENOUS | Status: DC | PRN
  Administered 2022-07-28: 60 mg via INTRAVENOUS

## 2022-07-28 MED ORDER — PROPOFOL 500 MG/50ML IV EMUL
INTRAVENOUS | Status: DC | PRN
  Administered 2022-07-28: 150 ug/kg/min via INTRAVENOUS

## 2022-07-28 NOTE — Anesthesia Preprocedure Evaluation (Signed)
Anesthesia Evaluation  Patient identified by MRN, date of birth, ID band Patient awake    Reviewed: Allergy & Precautions, NPO status , Patient's Chart, lab work & pertinent test results  Airway Mallampati: III  TM Distance: >3 FB Neck ROM: Full    Dental  (+) Dental Advisory Given, Poor Dentition, Chipped, Missing   Pulmonary asthma , sleep apnea and Continuous Positive Airway Pressure Ventilation , pneumonia, former smoker,    Pulmonary exam normal breath sounds clear to auscultation       Cardiovascular Exercise Tolerance: Poor hypertension, Pt. on medications + CAD  Normal cardiovascular exam Rhythm:Regular Rate:Normal     Neuro/Psych  Headaches, PSYCHIATRIC DISORDERS Anxiety Depression Dementia  Neuromuscular disease CVA    GI/Hepatic Neg liver ROS, GERD  Medicated and Controlled,  Endo/Other  negative endocrine ROS  Renal/GU Renal disease (stones) Bladder dysfunction (prostate cancer)      Musculoskeletal  (+) Arthritis , Osteoarthritis,    Abdominal   Peds negative pediatric ROS (+)  Hematology  (+) Blood dyscrasia, anemia ,   Anesthesia Other Findings Chronic back pain   Reproductive/Obstetrics negative OB ROS                            Anesthesia Physical Anesthesia Plan  ASA: 3  Anesthesia Plan: General   Post-op Pain Management: Minimal or no pain anticipated   Induction: Intravenous  PONV Risk Score and Plan: Propofol infusion  Airway Management Planned: Nasal Cannula, Natural Airway and Simple Face Mask  Additional Equipment:   Intra-op Plan:   Post-operative Plan:   Informed Consent: I have reviewed the patients History and Physical, chart, labs and discussed the procedure including the risks, benefits and alternatives for the proposed anesthesia with the patient or authorized representative who has indicated his/her understanding and acceptance.     Dental  advisory given  Plan Discussed with: CRNA and Surgeon  Anesthesia Plan Comments:         Anesthesia Quick Evaluation

## 2022-07-28 NOTE — Telephone Encounter (Signed)
Cody Velez please schedule OV thanks

## 2022-07-28 NOTE — H&P (Signed)
$'@LOGO'I$ @   Primary Care Physician:  Asencion Noble, MD Primary Gastroenterologist:  Dr. [name]  Pre-Procedure History & Physical: HPI:  Cody Velez is a 76 y.o. male here for surveillance colonoscopy.  History of small colonic adenoma removed 2015.  No bowel symptoms at this time.  Past Medical History:  Diagnosis Date   Allergic rhinitis    Arthritis    B12 deficiency    Cervicogenic headache    Childhood asthma    Chronic back pain    Coronary atherosclerosis of native coronary artery    Mild nonobstructive CAD at catheterization January 2015   Dementia [facility])    Depression    Essential hypertension    Falls    GERD (gastroesophageal reflux disease)    Glaucoma    History of blood transfusion    History of cerebrovascular disease 07/23/2015   History of kidney stones    History of pneumonia 02/2011   Hyperlipidemia    OSA (obstructive sleep apnea)    CPAP - Dr. [name] - uses cpap every night   Pneumonia    Prostate cancer [facility])    PTSD (post-traumatic stress disorder)    [place]   Rectal bleeding    Stroke ([place]) 03/2021   Wheelchair bound     Past Surgical History:  Procedure Laterality Date   APPLICATION OF ROBOTIC ASSISTANCE FOR SPINAL PROCEDURE N/A 03/28/2018   Procedure: APPLICATION OF ROBOTIC ASSISTANCE FOR SPINAL PROCEDURE;  Surgeon: Kristeen Miss, MD;  Location: [place];  Service: Neurosurgery;  Laterality: N/A;   BACK SURGERY  02/14/2012   lumbar OR #7; "today redid L1L2; replaced screws; added bone from hip"   BILATERAL KNEE ARTHROSCOPY     COLONOSCOPY  10/15/2008   Dr. [name]: tubular adenoma    COLONOSCOPY  12/17/2003   OJJ:[id number] rectal and colon   COLONOSCOPY N/A 09/05/2014   tubular adenoma   CYSTOSCOPY WITH STENT PLACEMENT Right 01/27/2016   Procedure: CYSTOSCOPY WITH STENT PLACEMENT;  Surgeon: [name], MD;  [facility]: [facility];  Service: Urology;  Laterality: Right;   CYSTOSCOPY/RETROGRADE/URETEROSCOPY/STONE EXTRACTION WITH BASKET Right  01/27/2016   Procedure: CYSTOSCOPY, RIGHT RETROGRADE, RIGHT URETEROSCOPY, STONE EXTRACTION ;  Surgeon: [name], MD;  Location: [facility];  Service: Urology;  Laterality: Right;   ESOPHAGOGASTRODUODENOSCOPY  10/15/2008   Dr Aariah Godette:Schatzki's ring status post dilation and disruption via 20 F Maloney dilator/ otherwise unremarkable esophagus, small hiatal hernia, multiple fundal gland polyps not manipulated, gastritis, negative H.pylori   ESOPHAGOGASTRODUODENOSCOPY  06/21/2002   WEX:HBZJI sliding hiatal hernia with mild changes of reflux esophagitis limited to gastroesophageal junction.  Noncritical ring at distal esophagus, 3 cm proximal to gastroesophageal junction/Antral gastritis   FACIAL COSMETIC SURGERY  ` 1985   "broke face playing softball"   FRACTURE SURGERY     "left wrist; broke it; took spur off"   HOLMIUM LASER APPLICATION Right 96/78/9381   Procedure: HOLMIUM LASER APPLICATION;  Surgeon: [name], MD;  Location: [facility];  Service: Urology;  Laterality: Right;   KNEE ARTHROSCOPY Right 05/18/2018   Procedure: PARTIAL MEDIAL MENISECTOMY AND SURGICAL LAVAGE AND CHONDROPLASTY;  Surgeon: [name], MD;  Location: [place];  Service: Orthopedics;  Laterality: Right;   LEFT HEART CATHETERIZATION WITH CORONARY ANGIOGRAM N/A 12/27/2013   Procedure: LEFT HEART CATHETERIZATION WITH CORONARY ANGIOGRAM;  Surgeon: [name], MD;  Location: [facility] CATH LAB;  Service: Cardiovascular;  Laterality: N/A;   neck epidural     POSTERIOR LUMBAR FUSION 4 LEVEL N/A 03/28/2018   Procedure: Thoracic  eight -Lumbar two- FIXATION WITH SCREW PLACEMENT, DECOMPRESSION Thoracic ten-Thoracic eleven  FOR OSTEOMYELITIS;  Surgeon: Kristeen Miss, MD;  Location: [place];  Service: Neurosurgery;  Laterality: N/A;   SHOULDER SURGERY Bilateral    SPINAL CORD STIMULATOR REMOVAL N/A 02/25/2020   Procedure: LUMBAR SPINAL CORD STIMULATOR REMOVAL;  Surgeon: Kristeen Miss, MD;  Location: [place];  Service: Neurosurgery;   Laterality: N/A;   TEE WITHOUT CARDIOVERSION N/A 03/21/2018   Procedure: TRANSESOPHAGEAL ECHOCARDIOGRAM (TEE) WITH PROPOFOL;  Surgeon: [name], MD;  [facility];  Service: Cardiovascular;  Laterality: N/A;    Prior to Admission medications   Medication Sig Start Date End Date Taking? Authorizing Provider  acidophilus (RISAQUAD) CAPS capsule Take 1 capsule by mouth daily.   Yes [provider]  alfuzosin (UROXATRAL) 10 MG 24 hr tablet Take 10 mg by mouth daily with breakfast.   Yes [provider]  amLODipine (NORVASC) 5 MG tablet Take 2.5 mg by mouth daily.   Yes [provider]  baclofen (LIORESAL) 10 MG tablet TAKE ONE TABLET IN THE MORNING AND MIDDAY, THEN TWO TABLETS AT NIGHT 06/12/18  Yes Kathrynn Ducking, MD  Cholecalciferol (VITAMIN D) 50 MCG (2000 UT) CAPS Take 2,000 Units by mouth daily.   Yes [provider]  clopidogrel (PLAVIX) 75 MG tablet Take 1 tablet (75 mg total) by mouth daily. 04/08/21  Yes Fredia Sorrow, MD  cyanocobalamin (VITAMIN B12) 500 MCG tablet Take 500 mcg by mouth daily.   Yes [provider]  dicyclomine (BENTYL) 10 MG capsule Take 1 capsule (10 mg total) by mouth 4 (four) times daily -  before meals and at bedtime. Start with just twice a day. Monitor for dry mouth, confusion, dizziness, constipation 06/02/22  Yes Annitta Needs, NP  docusate sodium (COLACE) 100 MG capsule Take 2 capsules (200 mg total) by mouth 2 (two) times daily. 05/22/18  Yes Bonnielee Haff, MD  donepezil (ARICEPT) 10 MG tablet Take 1 tablet (10 mg total) by mouth at bedtime. 05/22/18  Yes Bonnielee Haff, MD  finasteride (PROSCAR) 5 MG tablet Take 1 tablet (5 mg total) by mouth daily. Reported on 05/04/2016 05/22/18  Yes Bonnielee Haff, MD  fluticasone [facility]) 50 MCG/ACT nasal spray Place 1 spray into both nostrils daily as needed for allergies. 05/22/18  Yes Bonnielee Haff, MD  gabapentin (NEURONTIN) 400 MG capsule Take 1 capsule  (400 mg total) by mouth 2 (two) times daily. 03/03/22  Yes Suzzanne Cloud, NP  Lifitegrast [facility]) 5 % SOLN Place 1 drop into both eyes in the morning and at bedtime.   Yes [provider]  losartan (COZAAR) 100 MG tablet Take 100 mg by mouth daily.   Yes [provider]  omeprazole (PRILOSEC) 20 MG capsule Take 2 capsules (40 mg total) by mouth daily. 05/22/18  Yes Bonnielee Haff, MD  polyethylene glycol-electrolytes (NULYTELY) 420 g solution As directed 06/17/22  Yes Donasia Wimes, Cristopher Estimable, MD  potassium chloride SA (KLOR-CON M) 20 MEQ tablet Take 20 mEq by mouth daily.   Yes [provider]  rosuvastatin (CRESTOR) 20 MG tablet Take 20 mg by mouth daily.   Yes [provider]  traZODone (DESYREL) 100 MG tablet Take 1 tablet (100 mg total) by mouth at bedtime. 05/22/18  Yes Bonnielee Haff, MD  venlafaxine XR (EFFEXOR-XR) 150 MG 24 hr capsule Take 1 capsule (150 mg total) by mouth 2 (two) times daily. 05/22/18  Yes Bonnielee Haff, MD  cycloSPORINE (RESTASIS) 0.05 % ophthalmic  emulsion 1 DROP INTO BOTH EYES TWICE A DAY FOR DRY EYES Patient not taking: Reported on 07/20/2022 05/22/18   Bonnielee Haff, MD  Oxycodone HCl 10 MG TABS Take 1 tablet (10 mg total) by mouth every 4 (four) hours as needed (moderate to severe pain). 05/22/18   Bonnielee Haff, MD  oxyCODONE-acetaminophen (PERCOCET/ROXICET) 5-325 MG tablet Take 1 tablet by mouth 2 (two) times daily as needed for pain. 06/29/22   [provider]    Allergies as of 06/17/2022 - Review Complete 05/19/2022  Allergen Reaction Noted   Lisinopril  07/05/2014    Family History  Problem Relation Age of Onset   Emphysema Father    Heart failure Father    Lung cancer Father    CAD Father    Colon cancer Mother    Stroke Mother    Breast cancer Mother    Stroke Sister    Heart attack Brother    Dementia Paternal Uncle    Emphysema Maternal Grandmother    Stroke Maternal Grandmother    Asthma Other         grandson   Heart disease Paternal Grandfather    Anesthesia problems Neg Hx    Hypotension Neg Hx    Malignant hyperthermia Neg Hx    Pseudochol deficiency Neg Hx     Social History   Socioeconomic History   Marital status: Widowed    Spouse name: Rise Paganini   Number of children: 3   Years of education: Not on file   Highest education level: Not on file  Occupational History   Occupation: Print production planner    Employer: [employer]  Tobacco Use   Smoking status: Former    Types: Cigarettes    Quit date: 12/20/1958    Years since quitting: 63.6   Smokeless tobacco: Current    Types: Chew  Vaping Use   Vaping Use: Never used  Substance and Sexual Activity   Alcohol use: Not Currently    Alcohol/week: 0.0 standard drinks of alcohol    Comment: couple bottles of wine per month   Drug use: No    Comment: "last used marijuana ~ 1969"   Sexual activity: Not on file  Other Topics Concern   Not on file  Social History Narrative   Married, 3 daughters; retired   Patient drinks about 1 cup of coffee daily.   Patient is left handed.   Social Determinants of Health   Financial Resource Strain: Not on file  Food Insecurity: Not on file  Transportation Needs: Not on file  Physical Activity: Not on file  Stress: Not on file  Social Connections: Not on file  Intimate Partner Violence: Not on file    Review of Systems: See HPI, otherwise negative ROS  Physical Exam: BP (!) 146/91 (BP Location: Right Arm)   Pulse 83   Temp 98 F (36.7 C)   Resp 16   SpO2 95%  General:   Alert,  Well-developed, well-nourished, pleasant and cooperative in NAD Mouth:  No deformity or lesions. Neck:  Supple; no masses or thyromegaly. No significant cervical adenopathy. Lungs:  Clear throughout to auscultation.   No wheezes, crackles, or rhonchi. No acute distress. Heart:  Regular rate and rhythm; no murmurs, clicks, rubs,  or gallops. Abdomen: Non-distended, normal bowel sounds.  Soft and nontender without  appreciable mass or hepatosplenomegaly.  Pulses:  Normal pulses noted. Extremities:  Without clubbing or edema.  Impression/Plan: 76 year old gentleman here for surveillance colonoscopy.  History of colonic adenoma removed.  The risks, benefits, limitations, alternatives and imponderables have been reviewed with the patient. Questions have been answered. All parties are agreeable.       Notice: This dictation was prepared with Dragon dictation along with smaller phrase technology. Any transcriptional errors that result from this process are unintentional and may not be corrected upon review.

## 2022-07-28 NOTE — Anesthesia Postprocedure Evaluation (Signed)
Anesthesia Post Note  Patient: Cody Velez  Procedure(s) Performed: [place]  Patient location during evaluation: Phase II Anesthesia Type: General Level of consciousness: awake and alert and oriented Pain management: pain level controlled Vital Signs Assessment: post-procedure vital signs reviewed and stable Respiratory status: spontaneous breathing, nonlabored ventilation and respiratory function stable Cardiovascular status: blood pressure returned to baseline and stable Postop Assessment: no apparent nausea or vomiting Anesthetic complications: no   No notable events documented.   Last Vitals:  Vitals:   07/28/22 0915 07/28/22 1017  BP: (!) 146/91 103/63  Pulse: 83 77  Resp: 16 14  Temp: 36.7 C (!) 36.4 C  SpO2: 95% 91%    Last Pain:  Vitals:   07/28/22 1017  TempSrc: Oral  PainSc: Asleep                 Fiana Gladu C Mylani Gentry

## 2022-07-28 NOTE — Transfer of Care (Signed)
Immediate Anesthesia Transfer of Care Note  Patient: Cody Velez  Procedure(s) Performed: FLEXIBLE SIGMOIDOSCOPY  Patient Location: PACU  Anesthesia Type:General  Level of Consciousness: awake, alert  and oriented  Airway & Oxygen Therapy: Patient Spontanous Breathing  Post-op Assessment: Report given to RN, Post -op Vital signs reviewed and stable, Patient moving all extremities X 4 and Patient able to stick tongue midline  Post vital signs: Reviewed  Last Vitals:  Vitals Value Taken Time  BP 103/63 07/28/22 1017  Temp 36.4 C 07/28/22 1017  Pulse 77 07/28/22 1017  Resp 14 07/28/22 1017  SpO2 91 % 07/28/22 1017    Last Pain:  Vitals:   07/28/22 1017  TempSrc: Oral  PainSc: Asleep         Complications: No notable events documented.

## 2022-07-28 NOTE — Telephone Encounter (Signed)
-----   Message from Daneil Dolin, MD sent at 07/28/2022 10:14 AM EDT ----- Another bad prep.  Could not complete.  Needs office visit.

## 2022-07-28 NOTE — Op Note (Signed)
[facility] Patient Name: Cody Velez Procedure Date: 07/28/2022 9:55 AM MRN: [id number] Date of Birth: 1946-02-05 Attending MD: Norvel Richards , MD CSN: [id number] Age: 76 Admit Type: Outpatient Procedure:                Colonoscopy Indications:              High risk colon cancer surveillance: Personal                            history of colonic polyps Providers:                Norvel Richards, MD, Tammy Vaught, RN,                            Kristine L. Risa Grill, Technician, Ladoris Gene,                            Merchant [employer] officer Referring MD:              Medicines:                Propofol per Anesthesia Complications:            No immediate complications. Estimated Blood Loss:     Estimated blood loss: none. Procedure:                Pre-Anesthesia Assessment:                           - Prior to the procedure, a History and Physical                            was performed, and patient medications and                            allergies were reviewed. The patient's tolerance of                            previous anesthesia was also reviewed. The risks                            and benefits of the procedure and the sedation                            options and risks were discussed with the patient.                            All questions were answered, and informed consent                            was obtained. Prior Anticoagulants: The patient has                            taken no previous anticoagulant or antiplatelet                            agents. ASA Grade  Assessment: III - A patient with                            severe systemic disease. After reviewing the risks                            and benefits, the patient was deemed in                            satisfactory condition to undergo the procedure.                           After obtaining informed consent, the colonoscope                            was passed under direct vision.  Throughout the                            procedure, the patient's blood pressure, pulse, and                            oxygen saturations were monitored continuously. The                            [id number]) scope was introduced through the                            anus with the intention of advancing to the splenic                            flexure. The scope was advanced to the sigmoid                            colon before the procedure was aborted. Medications                            were given. The colonoscopy was performed without                            difficulty. The patient tolerated the procedure                            well. The quality of the bowel preparation was                            inadequate. Scope In: 10:09:14 AM Scope Out: 10:10:57 AM Total Procedure Duration: 0 hours 1 minute 43 seconds  Findings:      The perianal and digital rectal examinations were normal. Endoscopic       findings revealed formed and semiformed stool in the rectum trailing up       into the sigmoid. Effluent appeared to be more contaminated was stool       upstream. The procedure was terminated. Impression:               -  Preparation of the colon was inadequate.                           -Attempted colonoscopy aborted. Moderate Sedation:      Moderate (conscious) sedation was personally administered by an       anesthesia professional. The following parameters were monitored: oxygen       saturation, heart rate, blood pressure, respiratory rate, EKG, adequacy       of pulmonary ventilation, and response to care. Recommendation:           - Patient has a contact number available for                            emergencies. The signs and symptoms of potential                            delayed complications were discussed with the                            patient. Return to normal activities tomorrow.                            Written discharge instructions were  provided to the                            patient.                           - Advance diet as tolerated.                           - Continue present medications.                           - Return to my office at the next available                            appointment. Procedure Code(s):        --- Professional ---                           [phone], 28, Colonoscopy, flexible; diagnostic,                            including collection of specimen(s) by brushing or                            washing, when performed (separate procedure) Diagnosis Code(s):        --- Professional ---                           Z86.010, Personal history of colonic polyps CPT copyright 2019 American Medical Association. All rights reserved. The codes documented in this report are preliminary and upon coder review may  be revised to meet current compliance requirements. Cristopher Estimable. Jessy Cybulski, MD Norvel Richards, MD 07/28/2022 10:23:58 AM This report has been signed electronically. Number of  Addenda: 0

## 2022-07-28 NOTE — Discharge Instructions (Signed)
  Colonoscopy Discharge Instructions  Read the instructions outlined below and refer to this sheet in the next few weeks. These discharge instructions provide you with general information on caring for yourself after you leave the hospital. Your doctor may also give you specific instructions. While your treatment has been planned according to the most current medical practices available, unavoidable complications occasionally occur. If you have any problems or questions after discharge, call Dr. [name] at [phone]. ACTIVITY You may resume your regular activity, but move at a slower pace for the next 24 hours.  Take frequent rest periods for the next 24 hours.  Walking will help get rid of the air and reduce the bloated feeling in your belly (abdomen).  No driving for 24 hours (because of the medicine (anesthesia) used during the test).   Do not sign any important legal documents or operate any machinery for 24 hours (because of the anesthesia used during the test).  NUTRITION Drink plenty of fluids.  You may resume your normal diet as instructed by your doctor.  Begin with a light meal and progress to your normal diet. Heavy or fried foods are harder to digest and may make you feel sick to your stomach (nauseated).  Avoid alcoholic beverages for 24 hours or as instructed.  MEDICATIONS You may resume your normal medications unless your doctor tells you otherwise.  WHAT YOU CAN EXPECT TODAY Some feelings of bloating in the abdomen.  Passage of more gas than usual.  Spotting of blood in your stool or on the toilet paper.  IF YOU HAD POLYPS REMOVED DURING THE COLONOSCOPY: No aspirin products for 7 days or as instructed.  No alcohol for 7 days or as instructed.  Eat a soft diet for the next 24 hours.  FINDING OUT THE RESULTS OF YOUR TEST Not all test results are available during your visit. If your test results are not back during the visit, make an appointment with your caregiver to find out the  results. Do not assume everything is normal if you have not heard from your caregiver or the medical facility. It is important for you to follow up on all of your test results.  SEEK IMMEDIATE MEDICAL ATTENTION IF: You have more than a spotting of blood in your stool.  Your belly is swollen (abdominal distention).  You are nauseated or vomiting.  You have a temperature over 101.  You have abdominal pain or discomfort that is severe or gets worse throughout the day.     Your colonoscopy preparation was very poor.  Your colonoscopy could not be completed.  We will schedule a follow-up appointment in the office to get you rescheduled and reprepped  At patient request, I called Rise Paganini at [phone] message on voicemail

## 2022-07-30 NOTE — Telephone Encounter (Signed)
OV made °

## 2022-08-02 ENCOUNTER — Encounter (HOSPITAL_COMMUNITY): Payer: Self-pay | Admitting: Internal Medicine

## 2022-08-03 ENCOUNTER — Ambulatory Visit (INDEPENDENT_AMBULATORY_CARE_PROVIDER_SITE_OTHER): Payer: Medicare Other | Admitting: Internal Medicine

## 2022-08-03 ENCOUNTER — Encounter: Payer: Self-pay | Admitting: Internal Medicine

## 2022-08-03 VITALS — BP 142/89 | HR 89 | Temp 98.0°F | Ht 75.0 in

## 2022-08-03 DIAGNOSIS — I2510 Atherosclerotic heart disease of native coronary artery without angina pectoris: Secondary | ICD-10-CM

## 2022-08-03 DIAGNOSIS — Z86010 Personal history of colonic polyps: Secondary | ICD-10-CM

## 2022-08-03 DIAGNOSIS — K219 Gastro-esophageal reflux disease without esophagitis: Secondary | ICD-10-CM

## 2022-08-03 DIAGNOSIS — Z87898 Personal history of other specified conditions: Secondary | ICD-10-CM

## 2022-08-03 NOTE — Patient Instructions (Signed)
It was good to see you again today!  As discussed, our mutual goal is to obtain an adequate prep so that a thorough colonoscopy can be performed.  Prep will be as follows:  Clear liquid diet a full 2 days prior to the procedure  No Bentyl or codeine-containing medications during the 3 days prior to the procedure  Linzess 290 (1) capsule daily for the 4 days prior to the procedure (samples provided)  Fleets enema x1 the morning of the procedure

## 2022-08-03 NOTE — Progress Notes (Signed)
Primary Care Physician:  Asencion Noble, MD Primary Gastroenterologist:  Dr.   Pre-Procedure History & Physical: HPI:  Cody Velez is a 76 y.o. male here for Setting up a repeat surveillance colonoscopy.  Seen earlier this year by his for diarrhea which has gotten better.  History colonic adenoma removed [id number].  We attempted a colonoscopy recently but we were thwarted by a poor prep.  Poor mobility and polypharmacy likely contributed to prep failure.  He has not had any new GI symptoms.  He is ready to get the procedure rescheduled.  Past Medical History:  Diagnosis Date   Allergic rhinitis    Arthritis    B12 deficiency    Cervicogenic headache    Childhood asthma    Chronic back pain    Coronary atherosclerosis of native coronary artery    Mild nonobstructive CAD at catheterization January 2015   Dementia [facility])    Depression    Essential hypertension    Falls    GERD (gastroesophageal reflux disease)    Glaucoma    History of blood transfusion    History of cerebrovascular disease 07/23/2015   History of kidney stones    History of pneumonia 02/2011   Hyperlipidemia    OSA (obstructive sleep apnea)    CPAP - Dr. [name] - uses cpap every night   Pneumonia    Prostate cancer [facility])    PTSD (post-traumatic stress disorder)    [place]   Rectal bleeding    Stroke ([place]) 03/2021   Wheelchair bound     Past Surgical History:  Procedure Laterality Date   APPLICATION OF ROBOTIC ASSISTANCE FOR SPINAL PROCEDURE N/A 03/28/2018   Procedure: APPLICATION OF ROBOTIC ASSISTANCE FOR SPINAL PROCEDURE;  Surgeon: Kristeen Miss, MD;  Location: [place];  Service: Neurosurgery;  Laterality: N/A;   BACK SURGERY  02/14/2012   lumbar OR #7; "today redid L1L2; replaced screws; added bone from hip"   BILATERAL KNEE ARTHROSCOPY     COLONOSCOPY  10/15/2008   Dr. [name]: tubular adenoma    COLONOSCOPY  12/17/2003   RJJ:OACZYS rectal and colon   COLONOSCOPY N/A 09/05/2014   tubular adenoma    CYSTOSCOPY WITH STENT PLACEMENT Right 01/27/2016   Procedure: CYSTOSCOPY WITH STENT PLACEMENT;  Surgeon: [name], MD;  [facility]: [facility];  Service: Urology;  Laterality: Right;   CYSTOSCOPY/RETROGRADE/URETEROSCOPY/STONE EXTRACTION WITH BASKET Right 01/27/2016   Procedure: CYSTOSCOPY, RIGHT RETROGRADE, RIGHT URETEROSCOPY, STONE EXTRACTION ;  Surgeon: [name], MD;  Location: [facility];  Service: Urology;  Laterality: Right;   ESOPHAGOGASTRODUODENOSCOPY  10/15/2008   Dr Annamarie Yamaguchi:Schatzki's ring status post dilation and disruption via 36 F Maloney dilator/ otherwise unremarkable esophagus, small hiatal hernia, multiple fundal gland polyps not manipulated, gastritis, negative H.pylori   ESOPHAGOGASTRODUODENOSCOPY  06/21/2002   AYT:KZSWF sliding hiatal hernia with mild changes of reflux esophagitis limited to gastroesophageal junction.  Noncritical ring at distal esophagus, 3 cm proximal to gastroesophageal junction/Antral gastritis   FACIAL COSMETIC SURGERY  ` 1985   "broke face playing softball"   FLEXIBLE SIGMOIDOSCOPY N/A 07/28/2022   Procedure: FLEXIBLE SIGMOIDOSCOPY;  Surgeon: Daneil Dolin, MD;  [facility];  Service: Endoscopy;  Laterality: N/A;   FRACTURE SURGERY     "left wrist; broke it; took spur off"   HOLMIUM LASER APPLICATION Right 09/32/3557   Procedure: HOLMIUM LASER APPLICATION;  Surgeon: [name], MD;  Location: [facility];  Service: Urology;  Laterality: Right;   KNEE ARTHROSCOPY Right 05/18/2018   Procedure: PARTIAL  MEDIAL MENISECTOMY AND SURGICAL LAVAGE AND CHONDROPLASTY;  Surgeon: [name], MD;  Location: [place];  Service: Orthopedics;  Laterality: Right;   LEFT HEART CATHETERIZATION WITH CORONARY ANGIOGRAM N/A 12/27/2013   Procedure: LEFT HEART CATHETERIZATION WITH CORONARY ANGIOGRAM;  Surgeon: [name], MD;  Location: [facility] CATH LAB;  Service: Cardiovascular;  Laterality: N/A;   neck epidural     POSTERIOR LUMBAR FUSION 4 LEVEL N/A  03/28/2018   Procedure: Thoracic eight -Lumbar two- FIXATION WITH SCREW PLACEMENT, DECOMPRESSION Thoracic ten-Thoracic eleven  FOR OSTEOMYELITIS;  Surgeon: Kristeen Miss, MD;  Location: [place];  Service: Neurosurgery;  Laterality: N/A;   SHOULDER SURGERY Bilateral    SPINAL CORD STIMULATOR REMOVAL N/A 02/25/2020   Procedure: LUMBAR SPINAL CORD STIMULATOR REMOVAL;  Surgeon: Kristeen Miss, MD;  Location: [place];  Service: Neurosurgery;  Laterality: N/A;   TEE WITHOUT CARDIOVERSION N/A 03/21/2018   Procedure: TRANSESOPHAGEAL ECHOCARDIOGRAM (TEE) WITH PROPOFOL;  Surgeon: [name], MD;  [facility];  Service: Cardiovascular;  Laterality: N/A;    Prior to Admission medications   Medication Sig Start Date End Date Taking? Authorizing Provider  acidophilus (RISAQUAD) CAPS capsule Take 1 capsule by mouth daily.    [provider]  alfuzosin (UROXATRAL) 10 MG 24 hr tablet Take 10 mg by mouth daily with breakfast.    [provider]  amLODipine (NORVASC) 5 MG tablet Take 2.5 mg by mouth daily.    [provider]  baclofen (LIORESAL) 10 MG tablet TAKE ONE TABLET IN THE MORNING AND MIDDAY, THEN TWO TABLETS AT NIGHT 06/12/18   Kathrynn Ducking, MD  Cholecalciferol (VITAMIN D) 50 MCG (2000 UT) CAPS Take 2,000 Units by mouth daily.    [provider]  clopidogrel (PLAVIX) 75 MG tablet Take 1 tablet (75 mg total) by mouth daily. 04/08/21   Fredia Sorrow, MD  cyanocobalamin (VITAMIN B12) 500 MCG tablet Take 500 mcg by mouth daily.    [provider]  cycloSPORINE (RESTASIS) 0.05 % ophthalmic emulsion 1 DROP INTO BOTH EYES TWICE A DAY FOR DRY EYES Patient not taking: Reported on 07/20/2022 05/22/18   Bonnielee Haff, MD  dicyclomine (BENTYL) 10 MG capsule Take 1 capsule (10 mg total) by mouth 4 (four) times daily -  before meals and at bedtime. Start with just twice a day. Monitor for dry mouth, confusion, dizziness, constipation 06/02/22   Annitta Needs, NP  docusate sodium (COLACE) 100 MG capsule Take 2 capsules (200 mg total) by mouth 2 (two) times daily. 05/22/18   Bonnielee Haff, MD  donepezil (ARICEPT) 10 MG tablet Take 1 tablet (10 mg total) by mouth at bedtime. 05/22/18   Bonnielee Haff, MD  finasteride (PROSCAR) 5 MG tablet Take 1 tablet (5 mg total) by mouth daily. Reported on 05/04/2016 05/22/18   Bonnielee Haff, MD  fluticasone [facility]) 50 MCG/ACT nasal spray Place 1 spray into both nostrils daily as needed for allergies. 05/22/18   Bonnielee Haff, MD  gabapentin (NEURONTIN) 400 MG capsule Take 1 capsule (400 mg total) by mouth 2 (two) times daily. 03/03/22   Suzzanne Cloud, NP  Lifitegrast [facility]) 5 % SOLN Place 1 drop into both eyes in the morning and at bedtime.    [provider]  losartan (COZAAR) 100 MG tablet Take 100 mg by mouth daily.    [provider]  omeprazole (PRILOSEC) 20 MG capsule Take 2 capsules (40 mg total) by mouth daily. 05/22/18   Bonnielee Haff, MD  Oxycodone HCl  10 MG TABS Take 1 tablet (10 mg total) by mouth every 4 (four) hours as needed (moderate to severe pain). 05/22/18   Bonnielee Haff, MD  oxyCODONE-acetaminophen (PERCOCET/ROXICET) 5-325 MG tablet Take 1 tablet by mouth 2 (two) times daily as needed for pain. 06/29/22   [provider]  potassium chloride SA (KLOR-CON M) 20 MEQ tablet Take 20 mEq by mouth daily.    [provider]  rosuvastatin (CRESTOR) 20 MG tablet Take 20 mg by mouth daily.    [provider]  traZODone (DESYREL) 100 MG tablet Take 1 tablet (100 mg total) by mouth at bedtime. 05/22/18   Bonnielee Haff, MD  venlafaxine XR (EFFEXOR-XR) 150 MG 24 hr capsule Take 1 capsule (150 mg total) by mouth 2 (two) times daily. 05/22/18   Bonnielee Haff, MD    Allergies as of 08/03/2022 - Review Complete 08/03/2022  Allergen Reaction Noted   Lisinopril Cough 07/05/2014    Family History  Problem Relation Age of Onset   Emphysema Father    Heart failure  Father    Lung cancer Father    CAD Father    Colon cancer Mother    Stroke Mother    Breast cancer Mother    Stroke Sister    Heart attack Brother    Dementia Paternal Uncle    Emphysema Maternal Grandmother    Stroke Maternal Grandmother    Asthma Other        grandson   Heart disease Paternal Grandfather    Anesthesia problems Neg Hx    Hypotension Neg Hx    Malignant hyperthermia Neg Hx    Pseudochol deficiency Neg Hx     Social History   Socioeconomic History   Marital status: Widowed    Spouse name: Rise Paganini   Number of children: 3   Years of education: Not on file   Highest education level: Not on file  Occupational History   Occupation: Print production planner    Employer: [employer]  Tobacco Use   Smoking status: Former    Types: Cigarettes    Quit date: 12/20/1958    Years since quitting: 63.6   Smokeless tobacco: Current    Types: Chew  Vaping Use   Vaping Use: Never used  Substance and Sexual Activity   Alcohol use: Not Currently    Alcohol/week: 0.0 standard drinks of alcohol    Comment: couple bottles of wine per month   Drug use: No    Comment: "last used marijuana ~ 1969"   Sexual activity: Not on file  Other Topics Concern   Not on file  Social History Narrative   Married, 3 daughters; retired   Patient drinks about 1 cup of coffee daily.   Patient is left handed.   Social Determinants of Health   Financial Resource Strain: Not on file  Food Insecurity: Not on file  Transportation Needs: Not on file  Physical Activity: Not on file  Stress: Not on file  Social Connections: Not on file  Intimate Partner Violence: Not on file    Review of Systems: See HPI, otherwise negative ROS  Physical Exam: BP (!) 142/89 (BP Location: Left Arm, Patient Position: Sitting, Cuff Size: Large)   Pulse 89   Temp 98 F (36.7 C) (Temporal)   Ht '6\' 3"'$  (1.905 m)   SpO2 97%   BMI 39.40 kg/m  General:   Alert,   pleasant and cooperative in NAD -   Accompanied by his spouse. Neck:   Supple; no  masses or thyromegaly. No significant cervical adenopathy. Lungs:  Clear throughout to auscultation.   No wheezes, crackles, or rhonchi. No acute distress. Heart:  Regular rate and rhythm; no murmurs, clicks, rubs,  or gallops. Abdomen: Non-distended, normal bowel sounds.  Soft and nontender without appreciable mass or hepatosplenomegaly.  Pulses:  Normal pulses noted. Extremities:  Without clubbing or edema.  Impression/Plan:    76 year old gentleman with multiple comorbidities, decreased mobility and polypharmacy here to set up repeat attempt at surveillance colonoscopy. The risks, benefits, limitations, alternatives and imponderables have been reviewed with the patient. Questions have been answered. All parties are agreeable.       Recommendations:  As discussed, our mutual goal is to obtain an adequate prep so that a thorough colonoscopy can be performed.  Prep will be as follows:  Clear liquid diet a full 2 days prior to the procedure  No Bentyl or codeine-containing medications during the 3 days prior to the procedure  Linzess 290 (1) capsule daily for the 4 days prior to the procedure (samples provided)  Fleets enema x1 the morning of the procedure    Notice: This dictation was prepared with Dragon dictation along with smaller phrase technology. Any transcriptional errors that result from this process are unintentional and may not be corrected upon review.

## 2022-08-04 ENCOUNTER — Telehealth: Payer: Self-pay | Admitting: *Deleted

## 2022-08-04 ENCOUNTER — Encounter: Payer: Self-pay | Admitting: *Deleted

## 2022-08-04 MED ORDER — PEG 3350-KCL-NA BICARB-NACL 420 G PO SOLR: 4000.0000 mL | Freq: Once | ORAL | 0 refills | Status: AC

## 2022-08-04 NOTE — Telephone Encounter (Signed)
Patient has colonoscopy set up for 09/15/22 at 9:30 am. Pt informed he will get a pre-op appointment and will be told when to arrive at the hospital. Instructions will be mailed. Prep has been sent to the pharmacy.

## 2022-09-10 ENCOUNTER — Encounter (HOSPITAL_COMMUNITY)
Admission: RE | Admit: 2022-09-10 | Discharge: 2022-09-10 | Disposition: A | Discharge status: Home / Self Care | Payer: Medicare Other | Source: Ambulatory Visit | Attending: Internal Medicine | Admitting: Internal Medicine

## 2022-09-10 NOTE — Patient Instructions (Signed)
   Your procedure is scheduled on: 09/15/2022  Report to [facility] at  800   AM.  Call this number if you have problems the morning of surgery: [phone]   Remember:              Follow Directions on the letter you received from Your Physician's office regarding the Bowel Prep              No Smoking the day of Procedure :   Take these medicines the morning of surgery with A SIP OF WATER: Amlodipine, effexor, and oxycodone   Do not wear jewelry, make-up or nail polish.    Do not bring valuables to the hospital.  Contacts, dentures or bridgework may not be worn into surgery.  .   Patients discharged the day of surgery will not be allowed to drive home.     Colonoscopy, Adult, Care After This sheet gives you information about how to care for yourself after your procedure. Your health care provider may also give you more specific instructions. If you have problems or questions, contact your health care provider. What can I expect after the procedure? After the procedure, it is common to have: A small amount of blood in your stool for 24 hours after the procedure. Some gas. Mild abdominal cramping or bloating.  Follow these instructions at home: General instructions  For the first 24 hours after the procedure: Do not drive or use machinery. Do not sign important documents. Do not drink alcohol. Do your regular daily activities at a slower pace than normal. Eat soft, easy-to-digest foods. Rest often. Take over-the-counter or prescription medicines only as told by your health care provider. It is up to you to get the results of your procedure. Ask your health care provider, or the department performing the procedure, when your results will be ready. Relieving cramping and bloating Try walking around when you have cramps or feel bloated. Apply heat to your abdomen as told by your health care provider. Use a heat source that your health care provider recommends,  such as a moist heat pack or a heating pad. Place a towel between your skin and the heat source. Leave the heat on for 20-30 minutes. Remove the heat if your skin turns bright red. This is especially important if you are unable to feel pain, heat, or cold. You may have a greater risk of getting burned. Eating and drinking Drink enough fluid to keep your urine clear or pale yellow. Resume your normal diet as instructed by your health care provider. Avoid heavy or fried foods that are hard to digest. Avoid drinking alcohol for as long as instructed by your health care provider. Contact a health care provider if: You have blood in your stool 2-3 days after the procedure. Get help right away if: You have more than a small spotting of blood in your stool. You pass large blood clots in your stool. Your abdomen is swollen. You have nausea or vomiting. You have a fever. You have increasing abdominal pain that is not relieved with medicine. This information is not intended to replace advice given to you by your health care provider. Make sure you discuss any questions you have with your health care provider. Document Released: 07/20/2004 Document Revised: 08/30/2016 Document Reviewed: 02/17/2016 Elsevier Interactive Patient Education  [employer].

## 2022-09-10 NOTE — Pre-Procedure Instructions (Signed)
Left voicemail for patient to call us back.

## 2022-09-15 ENCOUNTER — Encounter (HOSPITAL_COMMUNITY): Payer: Self-pay | Admitting: Internal Medicine

## 2022-09-15 ENCOUNTER — Encounter (HOSPITAL_COMMUNITY)
Admission: RE | Disposition: A | Discharge status: Home / Self Care | Payer: Self-pay | Source: Home / Self Care | Attending: Internal Medicine

## 2022-09-15 ENCOUNTER — Ambulatory Visit (HOSPITAL_COMMUNITY): Payer: Medicare Other | Admitting: Certified Registered Nurse Anesthetist

## 2022-09-15 ENCOUNTER — Ambulatory Visit (HOSPITAL_BASED_OUTPATIENT_CLINIC_OR_DEPARTMENT_OTHER): Payer: Medicare Other | Admitting: Certified Registered Nurse Anesthetist

## 2022-09-15 ENCOUNTER — Ambulatory Visit (HOSPITAL_COMMUNITY)
Admission: RE | Admit: 2022-09-15 | Discharge: 2022-09-15 | Disposition: A | Discharge status: Home / Self Care | Payer: Medicare Other | Attending: Internal Medicine | Admitting: Internal Medicine

## 2022-09-15 ENCOUNTER — Other Ambulatory Visit: Payer: Self-pay

## 2022-09-15 DIAGNOSIS — D120 Benign neoplasm of cecum: Secondary | ICD-10-CM | POA: Insufficient documentation

## 2022-09-15 DIAGNOSIS — M549 Dorsalgia, unspecified: Secondary | ICD-10-CM | POA: Diagnosis not present

## 2022-09-15 DIAGNOSIS — I10 Essential (primary) hypertension: Secondary | ICD-10-CM

## 2022-09-15 DIAGNOSIS — D649 Anemia, unspecified: Secondary | ICD-10-CM | POA: Insufficient documentation

## 2022-09-15 DIAGNOSIS — R519 Headache, unspecified: Secondary | ICD-10-CM | POA: Insufficient documentation

## 2022-09-15 DIAGNOSIS — M1990 Unspecified osteoarthritis, unspecified site: Secondary | ICD-10-CM | POA: Diagnosis not present

## 2022-09-15 DIAGNOSIS — Z87891 Personal history of nicotine dependence: Secondary | ICD-10-CM

## 2022-09-15 DIAGNOSIS — Z8546 Personal history of malignant neoplasm of prostate: Secondary | ICD-10-CM | POA: Diagnosis not present

## 2022-09-15 DIAGNOSIS — I2510 Atherosclerotic heart disease of native coronary artery without angina pectoris: Secondary | ICD-10-CM | POA: Diagnosis not present

## 2022-09-15 DIAGNOSIS — D122 Benign neoplasm of ascending colon: Secondary | ICD-10-CM | POA: Insufficient documentation

## 2022-09-15 DIAGNOSIS — K219 Gastro-esophageal reflux disease without esophagitis: Secondary | ICD-10-CM | POA: Diagnosis not present

## 2022-09-15 DIAGNOSIS — K6389 Other specified diseases of intestine: Secondary | ICD-10-CM | POA: Diagnosis not present

## 2022-09-15 DIAGNOSIS — J45909 Unspecified asthma, uncomplicated: Secondary | ICD-10-CM | POA: Insufficient documentation

## 2022-09-15 DIAGNOSIS — Z86010 Personal history of colonic polyps: Secondary | ICD-10-CM

## 2022-09-15 DIAGNOSIS — F32A Depression, unspecified: Secondary | ICD-10-CM | POA: Insufficient documentation

## 2022-09-15 DIAGNOSIS — K635 Polyp of colon: Secondary | ICD-10-CM | POA: Diagnosis not present

## 2022-09-15 DIAGNOSIS — F419 Anxiety disorder, unspecified: Secondary | ICD-10-CM | POA: Diagnosis not present

## 2022-09-15 DIAGNOSIS — Z09 Encounter for follow-up examination after completed treatment for conditions other than malignant neoplasm: Secondary | ICD-10-CM | POA: Diagnosis not present

## 2022-09-15 DIAGNOSIS — G4730 Sleep apnea, unspecified: Secondary | ICD-10-CM | POA: Diagnosis not present

## 2022-09-15 DIAGNOSIS — G8929 Other chronic pain: Secondary | ICD-10-CM | POA: Diagnosis not present

## 2022-09-15 DIAGNOSIS — Z1211 Encounter for screening for malignant neoplasm of colon: Secondary | ICD-10-CM | POA: Diagnosis not present

## 2022-09-15 HISTORY — PX: SCLEROTHERAPY: SHX6841

## 2022-09-15 HISTORY — PX: POLYPECTOMY: SHX5525

## 2022-09-15 HISTORY — PX: COLONOSCOPY WITH PROPOFOL: SHX5780

## 2022-09-15 SURGERY — COLONOSCOPY WITH PROPOFOL
Anesthesia: General

## 2022-09-15 MED ORDER — PROPOFOL 500 MG/50ML IV EMUL
INTRAVENOUS | Status: AC
  Filled 2022-09-15: qty 50

## 2022-09-15 MED ORDER — VASOPRESSIN 20 UNIT/ML IV SOLN
INTRAVENOUS | Status: DC | PRN
  Administered 2022-09-15 (×3): 1 [IU] via INTRAVENOUS

## 2022-09-15 MED ORDER — PROPOFOL 500 MG/50ML IV EMUL
INTRAVENOUS | Status: DC | PRN
  Administered 2022-09-15: 150 ug/kg/min via INTRAVENOUS

## 2022-09-15 MED ORDER — PHENYLEPHRINE HCL (PRESSORS) 10 MG/ML IV SOLN
INTRAVENOUS | Status: DC | PRN
  Administered 2022-09-15: 80 ug via INTRAVENOUS
  Administered 2022-09-15: 160 ug via INTRAVENOUS
  Administered 2022-09-15: 80 ug via INTRAVENOUS
  Administered 2022-09-15: 160 ug via INTRAVENOUS
  Administered 2022-09-15: 80 ug via INTRAVENOUS
  Administered 2022-09-15: 160 ug via INTRAVENOUS

## 2022-09-15 MED ORDER — LACTATED RINGERS IV SOLN: INTRAVENOUS | Status: DC | PRN

## 2022-09-15 MED ORDER — PROPOFOL 10 MG/ML IV BOLUS
INTRAVENOUS | Status: DC | PRN
  Administered 2022-09-15: 60 mg via INTRAVENOUS

## 2022-09-15 MED ORDER — GLUCAGON HCL RDNA (DIAGNOSTIC) 1 MG IJ SOLR
INTRAMUSCULAR | Status: AC
  Filled 2022-09-15: qty 1

## 2022-09-15 MED ORDER — EPHEDRINE SULFATE-NACL 50-0.9 MG/10ML-% IV SOSY
PREFILLED_SYRINGE | INTRAVENOUS | Status: DC | PRN
  Administered 2022-09-15: 10 mg via INTRAVENOUS
  Administered 2022-09-15 (×3): 5 mg via INTRAVENOUS

## 2022-09-15 MED ORDER — VASOPRESSIN 20 UNIT/ML IV SOLN
INTRAVENOUS | Status: AC
  Filled 2022-09-15: qty 1

## 2022-09-15 MED ORDER — LACTATED RINGERS IV SOLN
INTRAVENOUS | Status: DC
Start: 2022-09-15 — End: 2022-09-15

## 2022-09-15 MED ORDER — GLUCAGON HCL RDNA (DIAGNOSTIC) 1 MG IJ SOLR
INTRAMUSCULAR | Status: DC | PRN
  Administered 2022-09-15: .5 mg via INTRAVENOUS

## 2022-09-15 MED ORDER — PHENYLEPHRINE 80 MCG/ML (10ML) SYRINGE FOR IV PUSH (FOR BLOOD PRESSURE SUPPORT)
PREFILLED_SYRINGE | INTRAVENOUS | Status: AC
  Filled 2022-09-15: qty 10

## 2022-09-15 NOTE — Discharge Instructions (Addendum)
  Colonoscopy Discharge Instructions  Read the instructions outlined below and refer to this sheet in the next few weeks. These discharge instructions provide you with general information on caring for yourself after you leave the hospital. Your doctor may also give you specific instructions. While your treatment has been planned according to the most current medical practices available, unavoidable complications occasionally occur. If you have any problems or questions after discharge, call Dr. [name] at [phone]. ACTIVITY You may resume your regular activity, but move at a slower pace for the next 24 hours.  Take frequent rest periods for the next 24 hours.  Walking will help get rid of the air and reduce the bloated feeling in your belly (abdomen).  No driving for 24 hours (because of the medicine (anesthesia) used during the test).   Do not sign any important legal documents or operate any machinery for 24 hours (because of the anesthesia used during the test).  NUTRITION Drink plenty of fluids.  You may resume your normal diet as instructed by your doctor.  Begin with a light meal and progress to your normal diet. Heavy or fried foods are harder to digest and may make you feel sick to your stomach (nauseated).  Avoid alcoholic beverages for 24 hours or as instructed.  MEDICATIONS You may resume your normal medications unless your doctor tells you otherwise.  WHAT YOU CAN EXPECT TODAY Some feelings of bloating in the abdomen.  Passage of more gas than usual.  Spotting of blood in your stool or on the toilet paper.  IF YOU HAD POLYPS REMOVED DURING THE COLONOSCOPY: No aspirin products for 7 days or as instructed.  No alcohol for 7 days or as instructed.  Eat a soft diet for the next 24 hours.  FINDING OUT THE RESULTS OF YOUR TEST Not all test results are available during your visit. If your test results are not back during the visit, make an appointment with your caregiver to find out the  results. Do not assume everything is normal if you have not heard from your caregiver or the medical facility. It is important for you to follow up on all of your test results.  SEEK IMMEDIATE MEDICAL ATTENTION IF: You have more than a spotting of blood in your stool.  Your belly is swollen (abdominal distention).  You are nauseated or vomiting.  You have a temperature over 101.  You have abdominal pain or discomfort that is severe or gets worse throughout the day.      Multiple polyps removed from your colon today.    Further recommendations to follow pending review of pathology report   at patient request, I called Valli Glance at [phone] findings and recommendation

## 2022-09-15 NOTE — Anesthesia Preprocedure Evaluation (Signed)
Anesthesia Evaluation  Patient identified by MRN, date of birth, ID band Patient awake    Reviewed: Allergy & Precautions, NPO status , Patient's Chart, lab work & pertinent test results  Airway Mallampati: III  TM Distance: >3 FB Neck ROM: Full    Dental  (+) Dental Advisory Given, Poor Dentition, Chipped, Missing   Pulmonary asthma , sleep apnea and Continuous Positive Airway Pressure Ventilation , pneumonia, former smoker,    Pulmonary exam normal breath sounds clear to auscultation       Cardiovascular Exercise Tolerance: Poor hypertension, Pt. on medications + CAD  Normal cardiovascular exam Rhythm:Regular Rate:Normal     Neuro/Psych  Headaches, PSYCHIATRIC DISORDERS Anxiety Depression Dementia  Neuromuscular disease CVA    GI/Hepatic Neg liver ROS, GERD  Medicated and Controlled,  Endo/Other  negative endocrine ROS  Renal/GU Renal disease (stones) Bladder dysfunction (prostate cancer)      Musculoskeletal  (+) Arthritis , Osteoarthritis,    Abdominal   Peds negative pediatric ROS (+)  Hematology  (+) Blood dyscrasia, anemia ,   Anesthesia Other Findings Chronic back pain   Reproductive/Obstetrics negative OB ROS                             Anesthesia Physical  Anesthesia Plan  ASA: 3  Anesthesia Plan: General   Post-op Pain Management: Minimal or no pain anticipated   Induction: Intravenous  PONV Risk Score and Plan: Propofol infusion  Airway Management Planned: Nasal Cannula, Natural Airway and Simple Face Mask  Additional Equipment:   Intra-op Plan:   Post-operative Plan:   Informed Consent: I have reviewed the patients History and Physical, chart, labs and discussed the procedure including the risks, benefits and alternatives for the proposed anesthesia with the patient or authorized representative who has indicated his/her understanding and acceptance.      Dental advisory given  Plan Discussed with: CRNA and Surgeon  Anesthesia Plan Comments:         Anesthesia Quick Evaluation

## 2022-09-15 NOTE — H&P (Signed)
$'@LOGO'y$ @   Primary Care Physician:  Asencion Noble, MD Primary Gastroenterologist:  Dr. [name]  Pre-Procedure History & Physical: HPI:  Cody Velez is a 76 y.o. male here for surveillance colonoscopy.  History colonic adenoma.  Recent colonoscopy attempt  -  thwarted by poor preparation.  Apparently, better prep for today.  Currently, no bowel symptoms.  Past Medical History:  Diagnosis Date   Allergic rhinitis    Arthritis    B12 deficiency    Cervicogenic headache    Childhood asthma    Chronic back pain    Coronary atherosclerosis of native coronary artery    Mild nonobstructive CAD at catheterization January 2015   Dementia [facility])    Depression    Essential hypertension    Falls    GERD (gastroesophageal reflux disease)    Glaucoma    History of blood transfusion    History of cerebrovascular disease 07/23/2015   History of kidney stones    History of pneumonia 02/2011   Hyperlipidemia    OSA (obstructive sleep apnea)    CPAP - Dr. [name] - uses cpap every night   Pneumonia    Prostate cancer [facility])    PTSD (post-traumatic stress disorder)    [place]   Rectal bleeding    Stroke ([place]) 03/2021   Wheelchair bound     Past Surgical History:  Procedure Laterality Date   APPLICATION OF ROBOTIC ASSISTANCE FOR SPINAL PROCEDURE N/A 03/28/2018   Procedure: APPLICATION OF ROBOTIC ASSISTANCE FOR SPINAL PROCEDURE;  Surgeon: Kristeen Miss, MD;  Location: [place];  Service: Neurosurgery;  Laterality: N/A;   BACK SURGERY  02/14/2012   lumbar OR #7; "today redid L1L2; replaced screws; added bone from hip"   BILATERAL KNEE ARTHROSCOPY     COLONOSCOPY  10/15/2008   Dr. [name]: tubular adenoma    COLONOSCOPY  12/17/2003   QIW:LNLGXQ rectal and colon   COLONOSCOPY N/A 09/05/2014   tubular adenoma   CYSTOSCOPY WITH STENT PLACEMENT Right 01/27/2016   Procedure: CYSTOSCOPY WITH STENT PLACEMENT;  Surgeon: [name], MD;  [facility];  Service: Urology;  Laterality: Right;    CYSTOSCOPY/RETROGRADE/URETEROSCOPY/STONE EXTRACTION WITH BASKET Right 01/27/2016   Procedure: CYSTOSCOPY, RIGHT RETROGRADE, RIGHT URETEROSCOPY, STONE EXTRACTION ;  Surgeon: [name], MD;  Location: [facility];  Service: Urology;  Laterality: Right;   ESOPHAGOGASTRODUODENOSCOPY  10/15/2008   Dr Savana Spina:Schatzki's ring status post dilation and disruption via [address] F [address]oney dilator/ otherwise unremarkable esophagus, small hiatal hernia, multiple fundal gland polyps not manipulated, gastritis, negative H.pylori   ESOPHAGOGASTRODUODENOSCOPY  06/21/2002   [facility]:ERDEY sliding hiatal hernia with mild changes of reflux esophagitis limited to gastroesophageal junction.  Noncritical ring at distal esophagus, 3 cm proximal to gastroesophageal junction/Antral gastritis   FACIAL COSMETIC SURGERY  ` 1985   "broke face playing softball"   FLEXIBLE SIGMOIDOSCOPY N/A 07/28/2022   Procedure: FLEXIBLE SIGMOIDOSCOPY;  Surgeon: Daneil Dolin, MD;  [facility];  Service: Endoscopy;  Laterality: N/A;   FRACTURE SURGERY     "left wrist; broke it; took spur off"   HOLMIUM LASER APPLICATION Right 81/44/8185   Procedure: HOLMIUM LASER APPLICATION;  Surgeon: [name], MD;  Location: [facility];  Service: Urology;  Laterality: Right;   KNEE ARTHROSCOPY Right 05/18/2018   Procedure: PARTIAL MEDIAL MENISECTOMY AND SURGICAL LAVAGE AND CHONDROPLASTY;  Surgeon: [name], MD;  Location: [place];  Service: Orthopedics;  Laterality: Right;   LEFT HEART CATHETERIZATION WITH CORONARY ANGIOGRAM N/A 12/27/2013   Procedure: LEFT HEART CATHETERIZATION  WITH CORONARY ANGIOGRAM;  Surgeon: [name], MD;  Location: [facility] CATH LAB;  Service: Cardiovascular;  Laterality: N/A;   neck epidural     POSTERIOR LUMBAR FUSION 4 LEVEL N/A 03/28/2018   Procedure: Thoracic eight -Lumbar two- FIXATION WITH SCREW PLACEMENT, DECOMPRESSION Thoracic ten-Thoracic eleven  FOR OSTEOMYELITIS;  Surgeon: Kristeen Miss, MD;  Location: [place];   Service: Neurosurgery;  Laterality: N/A;   SHOULDER SURGERY Bilateral    SPINAL CORD STIMULATOR REMOVAL N/A 02/25/2020   Procedure: LUMBAR SPINAL CORD STIMULATOR REMOVAL;  Surgeon: Kristeen Miss, MD;  Location: [place];  Service: Neurosurgery;  Laterality: N/A;   TEE WITHOUT CARDIOVERSION N/A 03/21/2018   Procedure: TRANSESOPHAGEAL ECHOCARDIOGRAM (TEE) WITH PROPOFOL;  Surgeon: [name], MD;  [facility];  Service: Cardiovascular;  Laterality: N/A;    Prior to Admission medications   Medication Sig Start Date End Date Taking? Authorizing Provider  acidophilus (RISAQUAD) CAPS capsule Take 1 capsule by mouth daily.   Yes [provider]  alfuzosin (UROXATRAL) 10 MG 24 hr tablet Take 10 mg by mouth daily with breakfast.   Yes [provider]  amLODipine (NORVASC) 5 MG tablet Take 5 mg by mouth daily.   Yes [provider]  baclofen (LIORESAL) 10 MG tablet TAKE ONE TABLET IN THE MORNING AND MIDDAY, THEN TWO TABLETS AT NIGHT 06/12/18  Yes Kathrynn Ducking, MD  Cholecalciferol (VITAMIN D) 50 MCG (2000 UT) CAPS Take 2,000 Units by mouth daily.   Yes [provider]  cyanocobalamin (VITAMIN B12) 500 MCG tablet Take 500 mcg by mouth daily.   Yes [provider]  dicyclomine (BENTYL) 10 MG capsule Take 1 capsule (10 mg total) by mouth 4 (four) times daily -  before meals and at bedtime. Start with just twice a day. Monitor for dry mouth, confusion, dizziness, constipation 06/02/22  Yes Annitta Needs, NP  finasteride (PROSCAR) 5 MG tablet Take 1 tablet (5 mg total) by mouth daily. Reported on 05/04/2016 05/22/18  Yes Bonnielee Haff, MD  fluticasone [facility]) 50 MCG/ACT nasal spray Place 1 spray into both nostrils daily as needed for allergies. 05/22/18  Yes Bonnielee Haff, MD  gabapentin (NEURONTIN) 400 MG capsule Take 1 capsule (400 mg total) by mouth 2 (two) times daily. 03/03/22  Yes Suzzanne Cloud, NP  Lifitegrast [facility]) 5 % SOLN Place 1 drop  into both eyes in the morning and at bedtime.   Yes [provider]  losartan (COZAAR) 100 MG tablet Take 100 mg by mouth daily.   Yes [provider]  omeprazole (PRILOSEC) 20 MG capsule Take 2 capsules (40 mg total) by mouth daily. 05/22/18  Yes Bonnielee Haff, MD  oxyCODONE-acetaminophen (PERCOCET/ROXICET) 5-325 MG tablet Take 1 tablet by mouth 2 (two) times daily as needed for pain. 06/29/22  Yes [provider]  potassium chloride SA (KLOR-CON M) 20 MEQ tablet Take 20 mEq by mouth daily.   Yes [provider]  rosuvastatin (CRESTOR) 20 MG tablet Take 20 mg by mouth daily.   Yes [provider]  traZODone (DESYREL) 100 MG tablet Take 1 tablet (100 mg total) by mouth at bedtime. 05/22/18  Yes Bonnielee Haff, MD  venlafaxine XR (EFFEXOR-XR) 150 MG 24 hr capsule Take 1 capsule (150 mg total) by mouth 2 (two) times daily. 05/22/18  Yes Bonnielee Haff, MD  clopidogrel (PLAVIX) 75 MG tablet Take 1 tablet (75 mg total) by mouth daily. Patient not taking: Reported on 09/10/2022 04/08/21   Fredia Sorrow,  MD  cycloSPORINE (RESTASIS) 0.05 % ophthalmic emulsion 1 DROP INTO BOTH EYES TWICE A DAY FOR DRY EYES Patient not taking: Reported on 09/10/2022 05/22/18   Bonnielee Haff, MD  docusate sodium (COLACE) 100 MG capsule Take 2 capsules (200 mg total) by mouth 2 (two) times daily. Patient not taking: Reported on 09/10/2022 05/22/18   Bonnielee Haff, MD  donepezil (ARICEPT) 10 MG tablet Take 1 tablet (10 mg total) by mouth at bedtime. Patient not taking: Reported on 09/10/2022 05/22/18   Bonnielee Haff, MD  Oxycodone HCl 10 MG TABS Take 1 tablet (10 mg total) by mouth every 4 (four) hours as needed (moderate to severe pain). Patient not taking: Reported on 09/10/2022 05/22/18   Bonnielee Haff, MD    Allergies as of 08/04/2022 - Review Complete 08/03/2022  Allergen Reaction Noted   Lisinopril Cough 07/05/2014    Family History  Problem Relation Age of Onset    Emphysema Father    Heart failure Father    Lung cancer Father    CAD Father    Colon cancer Mother    Stroke Mother    Breast cancer Mother    Stroke Sister    Heart attack Brother    Dementia Paternal Uncle    Emphysema Maternal Grandmother    Stroke Maternal Grandmother    Asthma Other        grandson   Heart disease Paternal Grandfather    Anesthesia problems Neg Hx    Hypotension Neg Hx    Malignant hyperthermia Neg Hx    Pseudochol deficiency Neg Hx     Social History   Socioeconomic History   Marital status: Widowed    Spouse name: Rise Paganini   Number of children: 3   Years of education: Not on file   Highest education level: Not on file  Occupational History   Occupation: Print production planner    Employer: [employer]  Tobacco Use   Smoking status: Former    Types: Cigarettes    Quit date: 12/20/1958    Years since quitting: 63.7   Smokeless tobacco: Current    Types: Chew  Vaping Use   Vaping Use: Never used  Substance and Sexual Activity   Alcohol use: Not Currently    Alcohol/week: 0.0 standard drinks of alcohol    Comment: couple bottles of wine per month   Drug use: No    Comment: "last used marijuana ~ 1969"   Sexual activity: Not on file  Other Topics Concern   Not on file  Social History Narrative   Married, 3 daughters; retired   Patient drinks about 1 cup of coffee daily.   Patient is left handed.   Social Determinants of Health   Financial Resource Strain: Not on file  Food Insecurity: Not on file  Transportation Needs: Not on file  Physical Activity: Not on file  Stress: Not on file  Social Connections: Not on file  Intimate Partner Violence: Not on file    Review of Systems: See HPI, otherwise negative ROS  Physical Exam: BP (!) 153/99   Pulse 79   Temp 97.8 F (36.6 C) (Oral)   Resp 17   SpO2 97%  General:   Alert,  Well-developed, well-nourished, pleasant and cooperative in NAD Neck:  Supple; no masses or thyromegaly. No significant cervical  adenopathy. Lungs:  Clear throughout to auscultation.   No wheezes, crackles, or rhonchi. No acute distress. Heart:  Regular rate and rhythm; no murmurs, clicks, rubs,  or gallops. Abdomen: Non-distended,  normal bowel sounds.  Soft and nontender without appreciable mass or hepatosplenomegaly.  Pulses:  Normal pulses noted. Extremities:  Without clubbing or edema.  Impression/Plan: 76 year old gentleman history colonic adenoma-here for surveillance colonoscopy  I have recommended a surveillance colonoscopy today.  The risks, benefits, limitations, alternatives and imponderables have been reviewed with the patient. Questions have been answered. All parties are agreeable.       Notice: This dictation was prepared with Dragon dictation along with smaller phrase technology. Any transcriptional errors that result from this process are unintentional and may not be corrected upon review.

## 2022-09-15 NOTE — Transfer of Care (Signed)
Immediate Anesthesia Transfer of Care Note  Patient: Cody Velez  Procedure(s) Performed: COLONOSCOPY WITH PROPOFOL POLYPECTOMY  Patient Location: PACU  Anesthesia Type:General  Level of Consciousness: drowsy, patient cooperative and responds to stimulation  Airway & Oxygen Therapy: Patient Spontanous Breathing and Patient connected to nasal cannula oxygen  Post-op Assessment: Report given to RN, Post -op Vital signs reviewed and stable, Patient moving all extremities X 4 and Patient able to stick tongue midline  Post vital signs: Reviewed  Last Vitals:  Vitals Value Taken Time  BP 138/76 09/15/22 1152  Temp 36.6 C 09/15/22 1152  Pulse 81 09/15/22 1153  Resp 16 09/15/22 1153  SpO2 94 % 09/15/22 1153  Vitals shown include unvalidated device data.  Last Pain:  Vitals:   09/15/22 1037  TempSrc:   PainSc: 0-No pain      Patients Stated Pain Goal: 9 (82/50/53 9767)  Complications: No notable events documented.

## 2022-09-15 NOTE — Op Note (Signed)
[facility] Patient Name: Cody Velez Procedure Date: 09/15/2022 10:04 AM MRN: [id number] Date of Birth: 08-Sep-1946 Attending MD: Norvel Richards , MD CSN: [id number] Age: 76 Admit Type: Outpatient Procedure:                Colonoscopy Indications:              High risk colon cancer surveillance: Personal                            history of colonic polyps Providers:                Norvel Richards, MD, Rosina Lowenstein, RN, Janeece Riggers, RN, Everardo Pacific, Aram Candela Referring MD:              Medicines:                Propofol per Anesthesia Complications:            No immediate complications. Estimated Blood Loss:     Estimated blood loss was minimal. Procedure:                Pre-Anesthesia Assessment:                           - Prior to the procedure, a History and Physical                            was performed, and patient medications and                            allergies were reviewed. The patient's tolerance of                            previous anesthesia was also reviewed. The risks                            and benefits of the procedure and the sedation                            options and risks were discussed with the patient.                            All questions were answered, and informed consent                            was obtained. Prior Anticoagulants: The patient has                            taken no previous anticoagulant or antiplatelet                            agents. ASA Grade Assessment: III - A patient with  severe systemic disease. After reviewing the risks                            and benefits, the patient was deemed in                            satisfactory condition to undergo the procedure.                           After obtaining informed consent, the colonoscope                            was passed under direct vision. Throughout the                             procedure, the patient's blood pressure, pulse, and                            oxygen saturations were monitored continuously. The                            [id number]) scope was introduced through                            the anus and advanced to the the cecum, identified                            by appendiceal orifice and ileocecal valve. The                            colonoscopy was performed without difficulty. The                            patient tolerated the procedure well. The quality                            of the bowel preparation was adequate. Scope In: 10:43:26 AM Scope Out: 11:43:17 AM Scope Withdrawal Time: 0 hours 39 minutes 45 seconds  Total Procedure Duration: 0 hours 59 minutes 51 seconds  Findings:      The perianal and digital rectal examinations were normal. Markedly       redundant colon with external abdominal pressure and changing of the       patient's position required to reach the cecum.      Multiple sessile polyps were found in the ascending colon and cecum. The       polyps were 3 to 25 mm in size. All but 1 polyp was removed with cold       snare. The single 25 cm polyp straddling a fold in the cecum was lifted       from the colonic wall with 7 cc of Ella view nicely. Hot snare piecemeal       polypectomy ensued. This lesion was felt to been removed totally. Margin       was touched up with the snare loop.Marland Kitchen Resection and retrieval were  complete. Estimated blood loss was minimal. Impression:               - Multiple 3 to 25 mm polyps in the ascending colon                            and in the cecum, removed with a cold and hot                            snare. Resected and retrieved. Markedly elongated                            and redundant colon. Moderate Sedation:      Moderate (conscious) sedation was personally administered by an       anesthesia professional. The following parameters were monitored: oxygen        saturation, heart rate, blood pressure, respiratory rate, EKG, adequacy       of pulmonary ventilation, and response to care. Recommendation:           - Patient has a contact number available for                            emergencies. The signs and symptoms of potential                            delayed complications were discussed with the                            patient. Return to normal activities tomorrow.                            Written discharge instructions were provided to the                            patient.                           - Advance diet as tolerated.                           - Continue present medications.                           -At a minimum, patient will need a repeat                            colonoscopy in 6 months for surveillance after                            piecemeal polypectomy.                           - Return to GI clinic in 6 months. Care will need                            to be taken for extra prep next  time to ensure an                            adequate examination. Procedure Code(s):        --- Professional ---                           [phone], Colonoscopy, flexible; with removal of                            tumor(s), polyp(s), or other lesion(s) by snare                            technique Diagnosis Code(s):        --- Professional ---                           Z86.010, Personal history of colonic polyps                           K63.5, Polyp of colon CPT copyright 2019 American Medical Association. All rights reserved. The codes documented in this report are preliminary and upon coder review may  be revised to meet current compliance requirements. Cristopher Estimable. Marv Alfrey, MD Norvel Richards, MD 09/15/2022 11:54:07 AM This report has been signed electronically. Number of Addenda: 0

## 2022-09-16 LAB — SURGICAL PATHOLOGY

## 2022-09-16 NOTE — Telephone Encounter (Signed)
Opened in error

## 2022-09-17 NOTE — Anesthesia Postprocedure Evaluation (Signed)
Anesthesia Post Note  Patient: Cody Velez  Procedure(s) Performed: COLONOSCOPY WITH PROPOFOL POLYPECTOMY SCLEROTHERAPY  Patient location during evaluation: Phase II Anesthesia Type: General Level of consciousness: awake Pain management: pain level controlled Vital Signs Assessment: post-procedure vital signs reviewed and stable Respiratory status: spontaneous breathing and respiratory function stable Cardiovascular status: blood pressure returned to baseline and stable Postop Assessment: no headache and no apparent nausea or vomiting Anesthetic complications: no Comments: Late entry   No notable events documented.   Last Vitals:  Vitals:   09/15/22 1216 09/15/22 1231  BP: (!) 150/95 (!) 159/95  Pulse: 78   Resp: 15 (!) 21  Temp: 36.4 C   SpO2: 90% 93%    Last Pain:  Vitals:   09/15/22 1231  TempSrc:   PainSc: 0-No pain                 Louann Sjogren

## 2022-09-18 ENCOUNTER — Encounter: Payer: Self-pay | Admitting: Internal Medicine

## 2022-09-22 ENCOUNTER — Encounter (HOSPITAL_COMMUNITY): Payer: Self-pay | Admitting: Internal Medicine

## 2022-10-31 ENCOUNTER — Emergency Department (HOSPITAL_COMMUNITY)
Admission: EM | Admit: 2022-10-31 | Discharge: 2022-10-31 | Disposition: A | Discharge status: Home / Self Care | Payer: No Typology Code available for payment source | Attending: Emergency Medicine | Admitting: Emergency Medicine

## 2022-10-31 ENCOUNTER — Emergency Department (HOSPITAL_COMMUNITY): Payer: No Typology Code available for payment source

## 2022-10-31 ENCOUNTER — Encounter (HOSPITAL_COMMUNITY): Payer: Self-pay | Admitting: *Deleted

## 2022-10-31 ENCOUNTER — Other Ambulatory Visit: Payer: Self-pay

## 2022-10-31 DIAGNOSIS — S79911A Unspecified injury of right hip, initial encounter: Secondary | ICD-10-CM | POA: Diagnosis not present

## 2022-10-31 DIAGNOSIS — M533 Sacrococcygeal disorders, not elsewhere classified: Secondary | ICD-10-CM | POA: Diagnosis not present

## 2022-10-31 DIAGNOSIS — Z79899 Other long term (current) drug therapy: Secondary | ICD-10-CM | POA: Diagnosis not present

## 2022-10-31 DIAGNOSIS — X501XXA Overexertion from prolonged static or awkward postures, initial encounter: Secondary | ICD-10-CM | POA: Diagnosis not present

## 2022-10-31 DIAGNOSIS — F0390 Unspecified dementia without behavioral disturbance: Secondary | ICD-10-CM | POA: Insufficient documentation

## 2022-10-31 DIAGNOSIS — W19XXXA Unspecified fall, initial encounter: Secondary | ICD-10-CM

## 2022-10-31 DIAGNOSIS — I10 Essential (primary) hypertension: Secondary | ICD-10-CM | POA: Insufficient documentation

## 2022-10-31 DIAGNOSIS — M25551 Pain in right hip: Secondary | ICD-10-CM | POA: Insufficient documentation

## 2022-10-31 MED ORDER — OXYCODONE-ACETAMINOPHEN 5-325 MG PO TABS
1.0000 | ORAL_TABLET | Freq: Once | ORAL | Status: AC
  Administered 2022-10-31: 1 via ORAL
  Filled 2022-10-31: qty 1

## 2022-10-31 NOTE — ED Triage Notes (Signed)
Pt with right hip pain since fall early this morning.  Pt states he heard a noise with fall, denies hitting his head. Took pain med from home PTA.  Pt not able to state if he is still taking Plavix of not.

## 2022-10-31 NOTE — Discharge Instructions (Addendum)
You have been seen today for your complaint of right hip pain. Your imaging was reassuring and showed no fractures or dislocations. Your discharge medications include your home oxycodone. Follow up with: Your primary care provider in 1 week Please seek immediate medical care if you develop any of the following symptoms: You fall. You have a sudden increase in pain and swelling in your hip. Your hip is red or swollen or very tender to touch. At this time there does not appear to be the presence of an emergent medical condition, however there is always the potential for conditions to change. Please read and follow the below instructions.  Do not take your medicine if  develop an itchy rash, swelling in your mouth or lips, or difficulty breathing; call 911 and seek immediate emergency medical attention if this occurs.  You may review your lab tests and imaging results in their entirety on your MyChart account.  Please discuss all results of fully with your primary care provider and other specialist at your follow-up visit.  Note: Portions of this text may have been transcribed using voice recognition software. Every effort was made to ensure accuracy; however, inadvertent computerized transcription errors may still be present.

## 2022-10-31 NOTE — ED Notes (Signed)
Pts wife came out of room stating pt had to pee; went into pts room and pt stated he had already urinated on himself; changed pt out of wet clothes and put those clothes into pt belongings bag; changed pt into clean brief and shorts that wife provided; adjusted pt to be more comfortable and gave pt blanket; call light and urinal within reach

## 2022-10-31 NOTE — ED Provider Notes (Signed)
[facility] EMERGENCY DEPARTMENT Provider Note   CSN: [id number] Arrival date & time: 10/31/22  1319     History  Chief Complaint  Patient presents with   Hip Pain    Cody Velez is a 76 y.o. male.  With extensive past medical history including but not limited to depression, GERD, chronic back pain, hypertension, wheelchair-bound status, OSA, stroke, arthritis, dementia who presents ED for evaluation of right hip pain.  Patient states he stood up out of his wheelchair at about 4 AM this morning to pull his diaper off when he lost his balance and fell forward.  He states he possibly tried to brace himself and twisted to the right.  Fell on his right hip.  States he noted immediate pain after that as well as a pop.  Patient states he has numbness and tingling in bilateral legs at baseline, this has remained unchanged.  Rates the pain at a 5 out of 10 at this time.  Describes it as an ache.  Pain goes up to an 8 out of 10 with movement and weightbearing.  Patient is wheelchair-bound, however does stand up and transfer on his own.  States this has been difficult since he fell this morning.  Denies hitting his head or losing consciousness.  Denies head, vision changes, saddle paresthesias, urinary or fecal incontinence, pain anywhere else.   Hip Pain       Home Medications Prior to Admission medications   Medication Sig Start Date End Date Taking? Authorizing Provider  acidophilus (RISAQUAD) CAPS capsule Take 1 capsule by mouth daily.    [provider]  alfuzosin (UROXATRAL) 10 MG 24 hr tablet Take 10 mg by mouth daily with breakfast.    [provider]  amLODipine (NORVASC) 5 MG tablet Take 5 mg by mouth daily.    [provider]  baclofen (LIORESAL) 10 MG tablet TAKE ONE TABLET IN THE MORNING AND MIDDAY, THEN TWO TABLETS AT NIGHT 06/12/18   Kathrynn Ducking, MD  Cholecalciferol (VITAMIN D) 50 MCG (2000 UT) CAPS Take 2,000 Units by mouth daily.     [provider]  clopidogrel (PLAVIX) 75 MG tablet Take 1 tablet (75 mg total) by mouth daily. Patient not taking: Reported on 09/10/2022 04/08/21   Fredia Sorrow, MD  cyanocobalamin (VITAMIN B12) 500 MCG tablet Take 500 mcg by mouth daily.    [provider]  cycloSPORINE (RESTASIS) 0.05 % ophthalmic emulsion 1 DROP INTO BOTH EYES TWICE A DAY FOR DRY EYES Patient not taking: Reported on 09/10/2022 05/22/18   Bonnielee Haff, MD  dicyclomine (BENTYL) 10 MG capsule Take 1 capsule (10 mg total) by mouth 4 (four) times daily -  before meals and at bedtime. Start with just twice a day. Monitor for dry mouth, confusion, dizziness, constipation 06/02/22   Annitta Needs, NP  docusate sodium (COLACE) 100 MG capsule Take 2 capsules (200 mg total) by mouth 2 (two) times daily. Patient not taking: Reported on 09/10/2022 05/22/18   Bonnielee Haff, MD  donepezil (ARICEPT) 10 MG tablet Take 1 tablet (10 mg total) by mouth at bedtime. Patient not taking: Reported on 09/10/2022 05/22/18   Bonnielee Haff, MD  finasteride (PROSCAR) 5 MG tablet Take 1 tablet (5 mg total) by mouth daily. Reported on 05/04/2016 05/22/18   Bonnielee Haff, MD  fluticasone [facility]) 50 MCG/ACT nasal spray Place 1 spray into both nostrils daily as needed for allergies. 05/22/18   Bonnielee Haff, MD  gabapentin (NEURONTIN) 400 MG capsule  Take 1 capsule (400 mg total) by mouth 2 (two) times daily. 03/03/22   Suzzanne Cloud, NP  Lifitegrast [facility]) 5 % SOLN Place 1 drop into both eyes in the morning and at bedtime.    [provider]  losartan (COZAAR) 100 MG tablet Take 100 mg by mouth daily.    [provider]  omeprazole (PRILOSEC) 20 MG capsule Take 2 capsules (40 mg total) by mouth daily. 05/22/18   Bonnielee Haff, MD  Oxycodone HCl 10 MG TABS Take 1 tablet (10 mg total) by mouth every 4 (four) hours as needed (moderate to severe pain). Patient not taking: Reported on 09/10/2022 05/22/18   Bonnielee Haff, MD   oxyCODONE-acetaminophen (PERCOCET/ROXICET) 5-325 MG tablet Take 1 tablet by mouth 2 (two) times daily as needed for pain. 06/29/22   [provider]  potassium chloride SA (KLOR-CON M) 20 MEQ tablet Take 20 mEq by mouth daily.    [provider]  rosuvastatin (CRESTOR) 20 MG tablet Take 20 mg by mouth daily.    [provider]  traZODone (DESYREL) 100 MG tablet Take 1 tablet (100 mg total) by mouth at bedtime. 05/22/18   Bonnielee Haff, MD  venlafaxine XR (EFFEXOR-XR) 150 MG 24 hr capsule Take 1 capsule (150 mg total) by mouth 2 (two) times daily. 05/22/18   Bonnielee Haff, MD      Allergies    Lisinopril    Review of Systems   Review of Systems  Musculoskeletal:  Positive for arthralgias and myalgias.  All other systems reviewed and are negative.   Physical Exam Updated Vital Signs BP (!) 147/82 (BP Location: Right Arm)   Pulse 83   Temp 98.3 F (36.8 C) (Oral)   Resp 19   Ht '6\' 3"'$  (1.905 m)   Wt 136.1 kg   SpO2 97%   BMI 37.50 kg/m  Physical Exam Vitals and nursing note reviewed.  Constitutional:      General: He is not in acute distress.    Appearance: Normal appearance. He is obese. He is not ill-appearing.  HENT:     Head: Normocephalic and atraumatic.  Cardiovascular:     Rate and Rhythm: Normal rate and regular rhythm.     Pulses:          Radial pulses are 2+ on the right side and 2+ on the left side.       Dorsalis pedis pulses are 2+ on the right side and 2+ on the left side.  Pulmonary:     Effort: Pulmonary effort is normal. No respiratory distress.  Abdominal:     General: Abdomen is flat.  Musculoskeletal:        General: Normal range of motion.     Cervical back: Neck supple.  Skin:    General: Skin is warm and dry.     Capillary Refill: Capillary refill takes less than 2 seconds.     Coloration: Skin is not pale.     Findings: No erythema.  Neurological:     General: No focal deficit present.     Mental Status: He is  alert and oriented to person, place, and time.     Comments: Strength and sensation intact in bilateral lower extremities  Psychiatric:        Mood and Affect: Mood normal.        Behavior: Behavior normal.     ED Results / Procedures / Treatments   Labs (all labs ordered are listed, but only abnormal  results are displayed) Labs Reviewed - No data to display  EKG None  Radiology CT Hip Right Wo Contrast  Result Date: 10/31/2022 CLINICAL DATA:  Hip trauma, fracture suspected. EXAM: CT OF THE RIGHT HIP WITHOUT CONTRAST TECHNIQUE: Multidetector CT imaging of the right hip was performed according to the standard protocol. Multiplanar CT image reconstructions were also generated. RADIATION DOSE REDUCTION: This exam was performed according to the departmental dose-optimization program which includes automated exposure control, adjustment of the mA and/or kV according to patient size and/or use of iterative reconstruction technique. COMPARISON:  Radiograph performed earlier on the same date. FINDINGS: Bones/Joint/Cartilage No evidence of fracture or dislocation. Mild superolateral hip joint space narrowing with marginal spurring suggesting mild osteoarthritis. No joint effusion. Pubic symphysis intact. Mild degenerative changes of the sacroiliac joint. Ligaments Suboptimally assessed by CT. Muscles and Tendons Muscles are normal in bulk. No intramuscular hematoma or fluid collection. Tendons are intact. Soft tissues Skin and subcutaneous soft tissue are within normal limits. No hematoma or fluid collection. IMPRESSION: 1. No evidence of fracture or dislocation. 2. Mild hip osteoarthritis Electronically Signed   By: Keane Police D.O.   On: 10/31/2022 17:49   DG Hip Unilat W or Wo Pelvis 2-3 Views Right  Result Date: 10/31/2022 CLINICAL DATA:  Fall, pain EXAM: DG HIP (WITH OR WITHOUT PELVIS) 2-3V RIGHT COMPARISON:  None Available. FINDINGS: Osteopenia. There is no evidence of displaced hip fracture  or dislocation. There is no evidence of arthropathy or other focal bone abnormality. Nonobstructive pattern of overlying bowel gas. IMPRESSION: No evidence of displaced hip or pelvic fracture. Please note that plain radiographs are significantly insensitive for hip and pelvic fracture, particularly in the setting of osteopenia. Electronically Signed   By: Delanna Ahmadi M.D.   On: 10/31/2022 14:12    Procedures Procedures    Medications Ordered in ED Medications  oxyCODONE-acetaminophen (PERCOCET/ROXICET) 5-325 MG per tablet 1 tablet (1 tablet Oral Given 10/31/22 1719)    ED Course/ Medical Decision Making/ A&P Clinical Course as of 10/31/22 1756  Sun Oct 31, 2022  1748 DG Hip Unilat W or Wo Pelvis 2-3 Views Right I personally reviewed the image.  No obvious fracture, inconclusive study [AS]  1755 CT Hip Right Wo Contrast I personally reviewed the image.  No fracture. [AS]    Clinical Course User Index [AS] Moani Weipert, [place] Folk, PA-C                           Medical Decision Making Amount and/or Complexity of Data Reviewed Radiology: ordered. Decision-making details documented in ED Course.  Risk Prescription drug management.  This patient presents to the ED for concern of right hip pain, this involves an extensive number of treatment options, and is a complaint that carries with it a high risk of complications and morbidity.  The differential diagnosis includes femoral or pelvic fracture, contusion, muscle strain, tendon or ligament sprain   My initial workup includes pain control, x-ray right hip, CT right hip  Additional history obtained from: Nursing notes from this visit. Family wife is present provides portion of the history   I ordered imaging studies including x-ray right hip, CT right hip I independently visualized and interpreted imaging which showed no fractures or dislocations I agree with the radiologist interpretation  Afebrile, hemodynamically stable.   Patient is a 76 year old male who is wheelchair-bound who presents the ED for evaluation of right hip pain.  Patient was standing up  from his wheelchair when he fell forward and onto his right hip.  Physical exam remarkable for slight tenderness of right hip.  Patient states he typically does not feel anything in either foot, however does have full sensation in bilateral feet on exam today.  DP pulses 2+ bilaterally.  Cap refill normal.  CT and x-ray of right hip negative.  Pain is 2 out of 10 after Percocet in the ED.  Patient does have oxycodone scheduled at home.  Patient was strongly encouraged to follow-up with his primary care provider within the next week for reevaluation.  Gave patient return precautions.  Stable discharge.  At this time there does not appear to be any evidence of an acute emergency medical condition and the patient appears stable for discharge with appropriate outpatient follow up. Diagnosis was discussed with patient who verbalizes understanding of care plan and is agreeable to discharge. I have discussed return precautions with patient and wife who verbalizes understanding. Patient encouraged to follow-up with their PCP within 1 week. All questions answered.  Patient's case discussed with Dr. Roderic Palau who agrees with plan to discharge with follow-up.   Note: Portions of this report may have been transcribed using voice recognition software. Every effort was made to ensure accuracy; however, inadvertent computerized transcription errors may still be present.          Final Clinical Impression(s) / ED Diagnoses Final diagnoses:  None    Rx / DC Orders ED Discharge Orders     None         Roylene Reason, Hershal Coria 10/31/22 1842    Milton Ferguson, MD 11/02/22 1155

## 2022-11-04 ENCOUNTER — Telehealth: Payer: Self-pay

## 2022-11-04 NOTE — Telephone Encounter (Signed)
        Patient  visited Dorita Fray on 11/12     Telephone encounter attempt :  1st  A HIPAA compliant voice message was left requesting a return call.  Instructed patient to call back    [place], [place] [facility]  [phone] [address], [place], [place] [address] Phone: [phone] Email: [email or web address]

## 2022-11-16 ENCOUNTER — Ambulatory Visit (INDEPENDENT_AMBULATORY_CARE_PROVIDER_SITE_OTHER): Payer: Medicare Other | Admitting: Gastroenterology

## 2022-11-16 ENCOUNTER — Encounter: Payer: Self-pay | Admitting: Gastroenterology

## 2022-11-16 VITALS — BP 150/84 | HR 84 | Temp 98.0°F | Ht 75.0 in

## 2022-11-16 DIAGNOSIS — K219 Gastro-esophageal reflux disease without esophagitis: Secondary | ICD-10-CM

## 2022-11-16 DIAGNOSIS — I2510 Atherosclerotic heart disease of native coronary artery without angina pectoris: Secondary | ICD-10-CM | POA: Diagnosis not present

## 2022-11-16 DIAGNOSIS — Z86010 Personal history of colonic polyps: Secondary | ICD-10-CM

## 2022-11-16 NOTE — Patient Instructions (Addendum)
We will set up a virtual visit for February 2024 so we can get you on the schedule for March 2024. You will need a colonoscopy due to the multiple polyps you had.  Continue omeprazole daily.  I recommend Benefiber 2 teaspoons each morning in beverage of your choice.   Please call with any concerns!  I enjoyed seeing you again today! As you know, I value our relationship and want to provide genuine, compassionate, and quality care. I welcome your feedback. If you receive a survey regarding your visit,  I greatly appreciate you taking time to fill this out. See you next time!  Annitta Needs, PhD, ANP-BC [facility] Gastroenterology

## 2022-11-16 NOTE — Progress Notes (Signed)
Gastroenterology Office Note     Primary Care Physician:  Asencion Noble, MD  Primary Gastroenterologist: Dr. [name]    Chief Complaint   Chief Complaint  Patient presents with   Follow-up    Pt here for follow up after colonoscopy     History of Present Illness   Cody Velez is a 76 y.o. male presenting today in follow-up with a history of GERD, recent colonoscopy completed in interim (sept 2023) with multiple 3-25 mm polyps and one removed via piecemeal fashion. He will need surveillance around March 2024.    He had to do a modified prep for last colonoscopy including 2 days clear liquids, no dicyclomine, Linzess 290 mcg daily for 4 days prior to procedure.   Omeprazole 40 mg daily. BM every 3 days. No feeling from waist down, so he does not know when BM will come. GERD controlled on omeprazole. No other concerns today.  He is not entirely clear about his med list.     Past Medical History:  Diagnosis Date   Allergic rhinitis    Arthritis    B12 deficiency    Cervicogenic headache    Childhood asthma    Chronic back pain    Coronary atherosclerosis of native coronary artery    Mild nonobstructive CAD at catheterization January 2015   Dementia [facility])    Depression    Essential hypertension    Falls    GERD (gastroesophageal reflux disease)    Glaucoma    History of blood transfusion    History of cerebrovascular disease 07/23/2015   History of kidney stones    History of pneumonia 02/2011   Hyperlipidemia    OSA (obstructive sleep apnea)    CPAP - Dr. [name] - uses cpap every night   Pneumonia    Prostate cancer [facility])    PTSD (post-traumatic stress disorder)    [place]   Rectal bleeding    Stroke ([place]) 03/2021   Wheelchair bound     Past Surgical History:  Procedure Laterality Date   APPLICATION OF ROBOTIC ASSISTANCE FOR SPINAL PROCEDURE N/A 03/28/2018   Procedure: APPLICATION OF ROBOTIC ASSISTANCE FOR SPINAL PROCEDURE;  Surgeon: Kristeen Miss, MD;   Location: [place];  Service: Neurosurgery;  Laterality: N/A;   BACK SURGERY  02/14/2012   lumbar OR #7; "today redid L1L2; replaced screws; added bone from hip"   BILATERAL KNEE ARTHROSCOPY     COLONOSCOPY  10/15/2008   Dr. [name]: tubular adenoma    COLONOSCOPY  12/17/2003   ASN:[id number] rectal and colon   COLONOSCOPY N/A 09/05/2014   tubular adenoma   COLONOSCOPY WITH PROPOFOL N/A 09/15/2022   Multiple 3-25 mm polyps in ascending colon and cecum, markedly elongated and redundant colon. One polyp removed piecemeal fashion. 6 month surveillance recommended. Tubular adenomas.   CYSTOSCOPY WITH STENT PLACEMENT Right 01/27/2016   Procedure: CYSTOSCOPY WITH STENT PLACEMENT;  Surgeon: [name], MD;  Location: [facility];  Service: Urology;  Laterality: Right;   CYSTOSCOPY/RETROGRADE/URETEROSCOPY/STONE EXTRACTION WITH BASKET Right 01/27/2016   Procedure: CYSTOSCOPY, RIGHT RETROGRADE, RIGHT URETEROSCOPY, STONE EXTRACTION ;  Surgeon: [name], MD;  Location: [facility];  Service: Urology;  Laterality: Right;   ESOPHAGOGASTRODUODENOSCOPY  10/15/2008   Dr Rourk:Schatzki's ring status post dilation and disruption via 27 F Maloney dilator/ otherwise unremarkable esophagus, small hiatal hernia, multiple fundal gland polyps not manipulated, gastritis, negative H.pylori   ESOPHAGOGASTRODUODENOSCOPY  06/21/2002   [facility]:LPFXT sliding hiatal hernia with mild changes of reflux esophagitis limited to  gastroesophageal junction.  Noncritical ring at distal esophagus, 3 cm proximal to gastroesophageal junction/Antral gastritis   FACIAL COSMETIC SURGERY  ` 1985   "broke face playing softball"   FLEXIBLE SIGMOIDOSCOPY N/A 07/28/2022   Procedure: FLEXIBLE SIGMOIDOSCOPY;  Surgeon: Daneil Dolin, MD;  [facility];  Service: Endoscopy;  Laterality: N/A;   FRACTURE SURGERY     "left wrist; broke it; took spur off"   HOLMIUM LASER APPLICATION Right 32/20/2542   Procedure: HOLMIUM LASER APPLICATION;   Surgeon: [name], MD;  Location: [facility];  Service: Urology;  Laterality: Right;   KNEE ARTHROSCOPY Right 05/18/2018   Procedure: PARTIAL MEDIAL MENISECTOMY AND SURGICAL LAVAGE AND CHONDROPLASTY;  Surgeon: [name], MD;  Location: [place];  Service: Orthopedics;  Laterality: Right;   LEFT HEART CATHETERIZATION WITH CORONARY ANGIOGRAM N/A 12/27/2013   Procedure: LEFT HEART CATHETERIZATION WITH CORONARY ANGIOGRAM;  Surgeon: [name], MD;  Location: [facility] CATH LAB;  Service: Cardiovascular;  Laterality: N/A;   neck epidural     POLYPECTOMY  09/15/2022   Procedure: POLYPECTOMY;  Surgeon: Daneil Dolin, MD;  [facility];  Service: Endoscopy;;   POSTERIOR LUMBAR FUSION 4 LEVEL N/A 03/28/2018   Procedure: Thoracic eight -Lumbar two- FIXATION WITH SCREW PLACEMENT, DECOMPRESSION Thoracic ten-Thoracic eleven  FOR OSTEOMYELITIS;  Surgeon: Kristeen Miss, MD;  Location: [place];  Service: Neurosurgery;  Laterality: N/A;   SCLEROTHERAPY  09/15/2022   Procedure: SCLEROTHERAPY;  Surgeon: Daneil Dolin, MD;  [facility];  Service: Endoscopy;;   SHOULDER SURGERY Bilateral    SPINAL CORD STIMULATOR REMOVAL N/A 02/25/2020   Procedure: LUMBAR SPINAL CORD STIMULATOR REMOVAL;  Surgeon: Kristeen Miss, MD;  Location: [place];  Service: Neurosurgery;  Laterality: N/A;   TEE WITHOUT CARDIOVERSION N/A 03/21/2018   Procedure: TRANSESOPHAGEAL ECHOCARDIOGRAM (TEE) WITH PROPOFOL;  Surgeon: [name], MD;  [facility];  Service: Cardiovascular;  Laterality: N/A;    Current Outpatient Medications  Medication Sig Dispense Refill   acidophilus (RISAQUAD) CAPS capsule Take 1 capsule by mouth daily.     alfuzosin (UROXATRAL) 10 MG 24 hr tablet Take 10 mg by mouth daily with breakfast.     amLODipine (NORVASC) 5 MG tablet Take 5 mg by mouth daily.     baclofen (LIORESAL) 10 MG tablet TAKE ONE TABLET IN THE MORNING AND MIDDAY, THEN TWO TABLETS AT NIGHT 360 each 3    Cholecalciferol (VITAMIN D) 50 MCG (2000 UT) CAPS Take 2,000 Units by mouth daily.     cyanocobalamin (VITAMIN B12) 500 MCG tablet Take 500 mcg by mouth daily.     finasteride (PROSCAR) 5 MG tablet Take 1 tablet (5 mg total) by mouth daily. Reported on 05/04/2016 30 tablet 0   fluticasone (FLONASE) 50 MCG/ACT nasal spray Place 1 spray into both nostrils daily as needed for allergies. 16 g 2   gabapentin (NEURONTIN) 400 MG capsule Take 1 capsule (400 mg total) by mouth 2 (two) times daily. 180 capsule 3   Lifitegrast (XIIDRA) 5 % SOLN Place 1 drop into both eyes in the morning and at bedtime.     losartan (COZAAR) 100 MG tablet Take 100 mg by mouth daily.     omeprazole (PRILOSEC) 20 MG capsule Take 2 capsules (40 mg total) by mouth daily. 30 capsule 0   oxyCODONE-acetaminophen (PERCOCET/ROXICET) 5-325 MG tablet Take 1 tablet by mouth 2 (two) times daily as needed for pain.     potassium chloride SA (KLOR-CON M) 20  MEQ tablet Take 20 mEq by mouth daily.     rosuvastatin (CRESTOR) 20 MG tablet Take 20 mg by mouth daily.     traZODone (DESYREL) 100 MG tablet Take 1 tablet (100 mg total) by mouth at bedtime. 30 tablet 0   venlafaxine XR (EFFEXOR-XR) 150 MG 24 hr capsule Take 1 capsule (150 mg total) by mouth 2 (two) times daily. 60 capsule 0   clopidogrel (PLAVIX) 75 MG tablet Take 1 tablet (75 mg total) by mouth daily. (Patient not taking: Reported on 09/10/2022) 30 tablet 2   No current facility-administered medications for this visit.    Allergies as of 11/16/2022 - Review Complete 11/16/2022  Allergen Reaction Noted   Lisinopril Cough 07/05/2014    Family History  Problem Relation Age of Onset   Emphysema Father    Heart failure Father    Lung cancer Father    CAD Father    Colon cancer Mother    Stroke Mother    Breast cancer Mother    Stroke Sister    Heart attack Brother    Dementia Paternal Uncle    Emphysema Maternal Grandmother    Stroke Maternal Grandmother    Asthma Other         grandson   Heart disease Paternal Grandfather    Anesthesia problems Neg Hx    Hypotension Neg Hx    Malignant hyperthermia Neg Hx    Pseudochol deficiency Neg Hx     Social History   Socioeconomic History   Marital status: Widowed    Spouse name: Rise Paganini   Number of children: 3   Years of education: Not on file   Highest education level: Not on file  Occupational History   Occupation: Print production planner    Employer: [employer]  Tobacco Use   Smoking status: Former    Types: Cigarettes    Quit date: 12/20/1958    Years since quitting: 63.9   Smokeless tobacco: Current    Types: Chew  Vaping Use   Vaping Use: Never used  Substance and Sexual Activity   Alcohol use: Not Currently    Alcohol/week: 0.0 standard drinks of alcohol    Comment: couple bottles of wine per month   Drug use: No    Comment: "last used marijuana ~ 1969"   Sexual activity: Not on file  Other Topics Concern   Not on file  Social History Narrative   Married, 3 daughters; retired   Patient drinks about 1 cup of coffee daily.   Patient is left handed.   Social Determinants of Health   Financial Resource Strain: Not on file  Food Insecurity: Not on file  Transportation Needs: Not on file  Physical Activity: Not on file  Stress: Not on file  Social Connections: Not on file  Intimate Partner Violence: Not on file     Review of Systems   Gen: Denies any fever, chills, fatigue, weight loss, lack of appetite.  CV: Denies chest pain, heart palpitations, peripheral edema, syncope.  Resp: Denies shortness of breath at rest or with exertion. Denies wheezing or cough.  GI: Denies dysphagia or odynophagia. Denies jaundice, hematemesis, fecal incontinence. GU : Denies urinary burning, urinary frequency, urinary hesitancy MS: Denies joint pain, muscle weakness, cramps, or limitation of movement.  Derm: Denies rash, itching, dry skin Psych: Denies depression, anxiety, memory loss, and confusion Heme: Denies bruising,  bleeding, and enlarged lymph nodes.   Physical Exam   BP (!) 150/84   Pulse 84  Temp 98 F (36.7 C)   Ht '6\' 3"'$  (1.905 m)   BMI 37.50 kg/m  General:   Alert and oriented. Pleasant and cooperative. Well-nourished and well-developed. In wheelchair Head:  Normocephalic and atraumatic. Eyes:  Without icterus Abdomen:  +BS, soft, non-tender and non-distended. No HSM noted. No guarding or rebound. No masses appreciated.  Rectal:  Deferred  Neurologic:  Alert and  oriented x4;  grossly normal neurologically. Skin:  Intact without significant lesions or rashes. Psych:  Alert and cooperative. Normal mood and affect.   Assessment   Cody Velez is a 76 y.o. male presenting today in follow-up with a history of  GERD, recent colonoscopy completed in interim (sept 2023) with multiple 3-25 mm polyps and one removed via piecemeal fashion. He will need surveillance around March 2024.    GERD: doing well on omeprazole 40 mg daily. Continue.  Adenomas: early interval surveillance due around March 2024. Will need to do virtual visit to ensure we have updated meds at that time and no health status changes.   Constipation: no straining or discomfort. BM every 3 days. Benefiber recommended. Can re-evaluate this in the future if need to adjust. He has no feeling from waist down, so hopefully can get on more of a predictable regimen. May need Linzess or similar but holding off right now to see how he responds.   PLAN    Continue PPI daily Benefiber daily Colonoscopy around March 2024 Virtual visit around Feb 2024 prior to colonoscopy    Annitta Needs, PhD, ANP-BC [facility] Gastroenterology

## 2022-11-22 NOTE — Progress Notes (Unsigned)
Cardiology Office Note  Date: 11/23/2022   ID: Cody Velez, DOB 1946/01/31, MRN [id number]  PCP:  Asencion Noble, MD  Cardiologist:  Rozann Lesches, MD Electrophysiologist:  None   Chief Complaint  Patient presents with   Cardiac follow-up    History of Present Illness: Cody Velez is a 76 y.o. male last seen in August 2022 by Ms. Strader PA-C, I reviewed the note.  Interval visit this year in May noted with Ms. Larkin Ina.  He is here for a routine visit.  He does not report any angina, no palpitations or syncope.  Uses his motorized wheelchair as before, has had some falls when trying to stand up.  He is still following through the [facility] system, has a physical with lab work pending in January 2024.  I did review his lipids from last year which looked quite good, LDL was 51 at that point on Crestor.  We went over his medications which are otherwise stable in terms of blood pressure regimen.  No bleeding problems reported on Plavix.  I did review his ECG from May as noted below.  Also cardiac monitor from June.  He had frequent PACs and a few brief episodes of PSVT but otherwise no sustained arrhythmias.  Past Medical History:  Diagnosis Date   Allergic rhinitis    Arthritis    B12 deficiency    Cervicogenic headache    Childhood asthma    Chronic back pain    Coronary atherosclerosis of native coronary artery    Mild nonobstructive CAD at catheterization January 2015   Dementia [facility])    Depression    Essential hypertension    Falls    GERD (gastroesophageal reflux disease)    Glaucoma    History of blood transfusion    History of cerebrovascular disease 07/23/2015   History of kidney stones    History of pneumonia 02/2011   Hyperlipidemia    OSA (obstructive sleep apnea)    CPAP - Dr. [name] - uses cpap every night   Pneumonia    Prostate cancer [facility])    PTSD (post-traumatic stress disorder)    [place]   Rectal bleeding    Stroke ([place]) 03/2021    Wheelchair bound     Past Surgical History:  Procedure Laterality Date   APPLICATION OF ROBOTIC ASSISTANCE FOR SPINAL PROCEDURE N/A 03/28/2018   Procedure: APPLICATION OF ROBOTIC ASSISTANCE FOR SPINAL PROCEDURE;  Surgeon: Kristeen Miss, MD;  Location: [place];  Service: Neurosurgery;  Laterality: N/A;   BACK SURGERY  02/14/2012   lumbar OR #7; "today redid L1L2; replaced screws; added bone from hip"   BILATERAL KNEE ARTHROSCOPY     COLONOSCOPY  10/15/2008   Dr. [name]: tubular adenoma    COLONOSCOPY  12/17/2003   ERD:EYCXKG rectal and colon   COLONOSCOPY N/A 09/05/2014   tubular adenoma   COLONOSCOPY WITH PROPOFOL N/A 09/15/2022   Multiple 3-25 mm polyps in ascending colon and cecum, markedly elongated and redundant colon. One polyp removed piecemeal fashion. 6 month surveillance recommended. Tubular adenomas.   CYSTOSCOPY WITH STENT PLACEMENT Right 01/27/2016   Procedure: CYSTOSCOPY WITH STENT PLACEMENT;  Surgeon: [name], MD;  [facility]: [facility];  Service: Urology;  Laterality: Right;   CYSTOSCOPY/RETROGRADE/URETEROSCOPY/STONE EXTRACTION WITH BASKET Right 01/27/2016   Procedure: CYSTOSCOPY, RIGHT RETROGRADE, RIGHT URETEROSCOPY, STONE EXTRACTION ;  Surgeon: [name], MD;  Location: [facility];  Service: Urology;  Laterality: Right;   ESOPHAGOGASTRODUODENOSCOPY  10/15/2008   Dr Rourk:Schatzki's ring  status post dilation and disruption via [address] dilator/ otherwise unremarkable esophagus, small hiatal hernia, multiple fundal gland polyps not manipulated, gastritis, negative H.pylori   ESOPHAGOGASTRODUODENOSCOPY  06/21/2002   KAJ:GOTLX sliding hiatal hernia with mild changes of reflux esophagitis limited to gastroesophageal junction.  Noncritical ring at distal esophagus, 3 cm proximal to gastroesophageal junction/Antral gastritis   FACIAL COSMETIC SURGERY  ` 1985   "broke face playing softball"   FLEXIBLE SIGMOIDOSCOPY N/A 07/28/2022   Procedure: FLEXIBLE SIGMOIDOSCOPY;   Surgeon: Daneil Dolin, MD;  [facility];  Service: Endoscopy;  Laterality: N/A;   FRACTURE SURGERY     "left wrist; broke it; took spur off"   HOLMIUM LASER APPLICATION Right 72/62/0355   Procedure: HOLMIUM LASER APPLICATION;  Surgeon: [name], MD;  Location: [facility];  Service: Urology;  Laterality: Right;   KNEE ARTHROSCOPY Right 05/18/2018   Procedure: PARTIAL MEDIAL MENISECTOMY AND SURGICAL LAVAGE AND CHONDROPLASTY;  Surgeon: [name], MD;  Location: [place];  Service: Orthopedics;  Laterality: Right;   LEFT HEART CATHETERIZATION WITH CORONARY ANGIOGRAM N/A 12/27/2013   Procedure: LEFT HEART CATHETERIZATION WITH CORONARY ANGIOGRAM;  Surgeon: [name], MD;  [facility] C[facility] LAB;  Service: Cardiovascular;  Laterality: N/A;   neck epidural     POLYPECTOMY  09/15/2022   Procedure: POLYPECTOMY;  Surgeon: Daneil Dolin, MD;  [facility];  Service: Endoscopy;;   POSTERIOR LUMBAR FUSION 4 LEVEL N/A 03/28/2018   Procedure: Thoracic eight -Lumbar two- FIXATION WITH SCREW PLACEMENT, DECOMPRESSION Thoracic ten-Thoracic eleven  FOR OSTEOMYELITIS;  Surgeon: Kristeen Miss, MD;  Location: [place];  Service: Neurosurgery;  Laterality: N/A;   SCLEROTHERAPY  09/15/2022   Procedure: SCLEROTHERAPY;  Surgeon: Daneil Dolin, MD;  [facility];  Service: Endoscopy;;   SHOULDER SURGERY Bilateral    SPINAL CORD STIMULATOR REMOVAL N/A 02/25/2020   Procedure: LUMBAR SPINAL CORD STIMULATOR REMOVAL;  Surgeon: Kristeen Miss, MD;  Location: [place];  Service: Neurosurgery;  Laterality: N/A;   TEE WITHOUT CARDIOVERSION N/A 03/21/2018   Procedure: TRANSESOPHAGEAL ECHOCARDIOGRAM (TEE) WITH PROPOFOL;  Surgeon: [name], MD;  [facility];  Service: Cardiovascular;  Laterality: N/A;    Current Outpatient Medications  Medication Sig Dispense Refill   acidophilus (RISAQUAD) CAPS capsule Take 1 capsule by mouth daily.     alfuzosin (UROXATRAL) 10 MG  24 hr tablet Take 10 mg by mouth daily with breakfast.     amLODipine (NORVASC) 5 MG tablet Take 2.5 mg by mouth daily.     baclofen (LIORESAL) 10 MG tablet TAKE ONE TABLET IN THE MORNING AND MIDDAY, THEN TWO TABLETS AT NIGHT (Patient taking differently: Take 10 mg by mouth 3 (three) times daily.) 360 each 3   Cholecalciferol (VITAMIN D) 50 MCG (2000 UT) CAPS Take 2,000 Units by mouth daily.     clopidogrel (PLAVIX) 75 MG tablet Take 1 tablet (75 mg total) by mouth daily. 30 tablet 2   cyanocobalamin (VITAMIN B12) 500 MCG tablet Take 500 mcg by mouth daily.     finasteride (PROSCAR) 5 MG tablet Take 1 tablet (5 mg total) by mouth daily. Reported on 05/04/2016 30 tablet 0   fluticasone (FLONASE) 50 MCG/ACT nasal spray Place 1 spray into both nostrils daily as needed for allergies. 16 g 2   gabapentin (NEURONTIN) 400 MG capsule Take 1 capsule (400 mg total) by mouth 2 (two) times daily. 180 capsule 3   Lifitegrast (XIIDRA) 5 % SOLN Place 1 drop  into both eyes in the morning and at bedtime.     losartan (COZAAR) 100 MG tablet Take 100 mg by mouth daily.     omeprazole (PRILOSEC) 20 MG capsule Take 2 capsules (40 mg total) by mouth daily. 30 capsule 0   oxyCODONE-acetaminophen (PERCOCET/ROXICET) 5-325 MG tablet Take 1 tablet by mouth 2 (two) times daily as needed for pain.     potassium chloride SA (KLOR-CON M) 20 MEQ tablet Take 20 mEq by mouth daily.     rosuvastatin (CRESTOR) 20 MG tablet Take 20 mg by mouth daily.     traZODone (DESYREL) 100 MG tablet Take 1 tablet (100 mg total) by mouth at bedtime. 30 tablet 0   venlafaxine XR (EFFEXOR-XR) 150 MG 24 hr capsule Take 1 capsule (150 mg total) by mouth 2 (two) times daily. 60 capsule 0   No current facility-administered medications for this visit.   Allergies:  Lisinopril   ROS:  Has had some falls when trying to stand. No syncope.  Physical Exam: VS:  BP 130/70   Pulse 68   Ht '6\' 3"'$  (1.905 m)   Wt 290 lb (131.5 kg)   SpO2 95%   BMI 36.25  kg/m , BMI Body mass index is 36.25 kg/m.  Wt Readings from Last 3 Encounters:  11/23/22 290 lb (131.5 kg)  10/31/22 300 lb (136.1 kg)  09/10/22 (!) 315 lb 4.1 oz (143 kg)    General: Patient appears comfortable at rest.  In motorized wheelchair. HEENT: Conjunctiva and lids normal. Neck: Supple, no elevated JVP or carotid bruits, no thyromegaly. Lungs: Clear to auscultation, nonlabored breathing at rest. Cardiac: Regular rate and rhythm, no S3 or significant systolic murmur, no pericardial rub.  ECG:  An ECG dated 05/19/2022 was personally reviewed today and demonstrated:  Sinus rhythm with leftward axis, poor R wave progression, and atrial bigeminy.  Recent Labwork:  July 2022: Cholesterol 112, triglycerides 161, HDL 34, LDL 51 October 2022: Hemoglobin 14.7, platelets 348  Other Studies Reviewed Today:  Cardiac monitor June 2023: ZIO XT reviewed.  3 days, 3 hours analyzed.   1.  Predominant rhythm is sinus with heart rate ranging from 57 bpm up to 115 bpm and average heart rate 83 bpm. 2.  There were frequent PACs representing 28.1% total beats with otherwise rare atrial couplets and triplets representing less than 1% total beats. 3.  There were occasional PVCs representing 2.6% total beats with otherwise rare ventricular couplets and triplets representing less than 1% total beats.  There were also episodes of ventricular bigeminy and trigeminy. 4.  There was a single episode of NSVT lasting 4 beats. 5.  A few brief episodes of PSVT were noted, the longest of which was 9 beats. 6.  No patient triggered events.  Assessment and Plan:  1.  History of nonobstructive CAD without active angina at current level of activity and plan for medical therapy.  He is on Plavix (history of stroke), Cozaar, Norvasc, and Crestor.  Last LDL 51 and at goal.  He will have repeat lab work in January to the [place] system.  No changes were made today.  2.  Essential hypertension, systolic 010 today.   Continue Norvasc and Cozaar.  3.  Frequent PACs, no significant arrhythmias by cardiac monitor in June.  Has had atrial bigeminy with "pseudo-bradycardia" as well, but no true evidence of sinus node dysfunction or heart block.  He is not on AV nodal blockers at this point.  Continue with observation.  Medication Adjustments/Labs and Tests Ordered: Current medicines are reviewed at length with the patient today.  Concerns regarding medicines are outlined above.   Tests Ordered: No orders of the defined types were placed in this encounter.   Medication Changes: No orders of the defined types were placed in this encounter.   Disposition:  Follow up  1 year.  Signed, [name], MD, [facility] 11/23/2022 11:24 AM    [place] [facility] [address], [place], [place] [address] Phone: [phone]; Fax: [phone]

## 2022-11-23 ENCOUNTER — Encounter: Payer: Self-pay | Admitting: Cardiology

## 2022-11-23 ENCOUNTER — Ambulatory Visit: Payer: Medicare Other | Attending: Cardiology | Admitting: Cardiology

## 2022-11-23 VITALS — BP 130/70 | HR 68 | Ht 75.0 in | Wt 290.0 lb

## 2022-11-23 DIAGNOSIS — R262 Difficulty in walking, not elsewhere classified: Secondary | ICD-10-CM | POA: Insufficient documentation

## 2022-11-23 DIAGNOSIS — E785 Hyperlipidemia, unspecified: Secondary | ICD-10-CM | POA: Diagnosis not present

## 2022-11-23 DIAGNOSIS — I10 Essential (primary) hypertension: Secondary | ICD-10-CM | POA: Insufficient documentation

## 2022-11-23 DIAGNOSIS — I498 Other specified cardiac arrhythmias: Secondary | ICD-10-CM | POA: Diagnosis not present

## 2022-11-23 DIAGNOSIS — I2510 Atherosclerotic heart disease of native coronary artery without angina pectoris: Secondary | ICD-10-CM | POA: Insufficient documentation

## 2022-11-23 DIAGNOSIS — R2689 Other abnormalities of gait and mobility: Secondary | ICD-10-CM | POA: Insufficient documentation

## 2022-11-23 DIAGNOSIS — M6281 Muscle weakness (generalized): Secondary | ICD-10-CM | POA: Insufficient documentation

## 2022-11-23 DIAGNOSIS — M25551 Pain in right hip: Secondary | ICD-10-CM | POA: Insufficient documentation

## 2022-11-23 NOTE — Patient Instructions (Addendum)
Medication Instructions:  ?Your physician recommends that you continue on your current medications as directed. Please refer to the Current Medication list given to you today. ? ?Labwork: ?none ? ?Testing/Procedures: ?none ? ?Follow-Up: ?Your physician recommends that you schedule a follow-up appointment in: 1 year. You will receive a reminder call in the mail in about 10 months reminding you to call and schedule your appointment. If you don't receive this call, please contact our office. ? ?Any Other Special Instructions Will Be Listed Below (If Applicable). ? ?If you need a refill on your cardiac medications before your next appointment, please call your pharmacy. ?

## 2022-11-25 ENCOUNTER — Ambulatory Visit (HOSPITAL_COMMUNITY): Payer: Medicare Other | Attending: Cardiology | Admitting: Physical Therapy

## 2022-11-25 DIAGNOSIS — M6281 Muscle weakness (generalized): Secondary | ICD-10-CM

## 2022-11-25 DIAGNOSIS — M25551 Pain in right hip: Secondary | ICD-10-CM

## 2022-11-25 DIAGNOSIS — I498 Other specified cardiac arrhythmias: Secondary | ICD-10-CM | POA: Diagnosis not present

## 2022-11-25 DIAGNOSIS — I10 Essential (primary) hypertension: Secondary | ICD-10-CM | POA: Diagnosis not present

## 2022-11-25 DIAGNOSIS — E785 Hyperlipidemia, unspecified: Secondary | ICD-10-CM | POA: Diagnosis not present

## 2022-11-25 DIAGNOSIS — I2510 Atherosclerotic heart disease of native coronary artery without angina pectoris: Secondary | ICD-10-CM | POA: Diagnosis not present

## 2022-11-25 DIAGNOSIS — R2689 Other abnormalities of gait and mobility: Secondary | ICD-10-CM | POA: Diagnosis not present

## 2022-11-25 DIAGNOSIS — R262 Difficulty in walking, not elsewhere classified: Secondary | ICD-10-CM | POA: Diagnosis not present

## 2022-11-25 NOTE — Therapy (Addendum)
OUTPATIENT PHYSICAL THERAPY LOWER EXTREMITY EVALUATION   Patient Name: Cody Velez MRN: [id number] DOB:09-01-1946, 76 y.o., male Today's Date: 11/25/2022  END OF SESSION:  PT End of Session - 11/25/22 1502     Visit Number 1    Number of Visits 12    Date for PT Re-Evaluation 01/06/23    Authorization Type Medicare A/ BCBS Federal 2ndary    Progress Note Due on Visit 10    PT Start Time 1430    PT Stop Time 1511    PT Time Calculation (min) 41 min    Activity Tolerance Patient tolerated treatment well;Patient limited by pain    Behavior During Therapy [facility] for tasks assessed/performed             Past Medical History:  Diagnosis Date   Allergic rhinitis    Arthritis    B12 deficiency    Cervicogenic headache    Childhood asthma    Chronic back pain    Coronary atherosclerosis of native coronary artery    Mild nonobstructive CAD at catheterization January 2015   Dementia [facility])    Depression    Essential hypertension    Falls    GERD (gastroesophageal reflux disease)    Glaucoma    History of blood transfusion    History of cerebrovascular disease 07/23/2015   History of kidney stones    History of pneumonia 02/2011   Hyperlipidemia    OSA (obstructive sleep apnea)    CPAP - Dr. [name] - uses cpap every night   Pneumonia    Prostate cancer ([place])    PTSD (post-traumatic stress disorder)    [place]   Rectal bleeding    Stroke ([place]) 03/2021   Wheelchair bound    Past Surgical History:  Procedure Laterality Date   APPLICATION OF ROBOTIC ASSISTANCE FOR SPINAL PROCEDURE N/A 03/28/2018   Procedure: APPLICATION OF ROBOTIC ASSISTANCE FOR SPINAL PROCEDURE;  Surgeon: Kristeen Miss, MD;  Location: [place];  Service: Neurosurgery;  Laterality: N/A;   BACK SURGERY  02/14/2012   lumbar OR #7; "today redid L1L2; replaced screws; added bone from hip"   BILATERAL KNEE ARTHROSCOPY     COLONOSCOPY  10/15/2008   Dr. [name]: tubular adenoma    COLONOSCOPY  12/17/2003    IEP:PIRJJO rectal and colon   COLONOSCOPY N/A 09/05/2014   tubular adenoma   COLONOSCOPY WITH PROPOFOL N/A 09/15/2022   Multiple 3-25 mm polyps in ascending colon and cecum, markedly elongated and redundant colon. One polyp removed piecemeal fashion. 6 month surveillance recommended. Tubular adenomas.   CYSTOSCOPY WITH STENT PLACEMENT Right 01/27/2016   Procedure: CYSTOSCOPY WITH STENT PLACEMENT;  Surgeon: [name], MD;  [facility];  Service: Urology;  Laterality: Right;   CYSTOSCOPY/RETROGRADE/URETEROSCOPY/STONE EXTRACTION WITH BASKET Right 01/27/2016   Procedure: CYSTOSCOPY, RIGHT RETROGRADE, RIGHT URETEROSCOPY, STONE EXTRACTION ;  Surgeon: [name], MD;  Location: [facility];  Service: Urology;  Laterality: Right;   ESOPHAGOGASTRODUODENOSCOPY  10/15/2008   Dr Rourk:Schatzki's ring status post dilation and disruption via 103 F Maloney dilator/ otherwise unremarkable esophagus, small hiatal hernia, multiple fundal gland polyps not manipulated, gastritis, negative H.pylori   ESOPHAGOGASTRODUODENOSCOPY  06/21/2002   ACZ:YSAYT sliding hiatal hernia with mild changes of reflux esophagitis limited to gastroesophageal junction.  Noncritical ring at distal esophagus, 3 cm proximal to gastroesophageal junction/Antral gastritis   FACIAL COSMETIC SURGERY  ` 1985   "broke face playing softball"   [place] N/A 07/28/2022   Procedure: FLEXIBLE SIGMOIDOSCOPY;  Surgeon: Manus Rudd  M, MD;  [facility];  Service: Endoscopy;  Laterality: N/A;   FRACTURE SURGERY     "left wrist; broke it; took spur off"   HOLMIUM LASER APPLICATION Right 17/61/6073   Procedure: HOLMIUM LASER APPLICATION;  Surgeon: [name], MD;  Location: [facility];  Service: Urology;  Laterality: Right;   KNEE ARTHROSCOPY Right 05/18/2018   Procedure: PARTIAL MEDIAL MENISECTOMY AND SURGICAL LAVAGE AND CHONDROPLASTY;  Surgeon: [name], MD;  Location: [place];  Service: Orthopedics;   Laterality: Right;   LEFT HEART CATHETERIZATION WITH CORONARY ANGIOGRAM N/A 12/27/2013   Procedure: LEFT HEART CATHETERIZATION WITH CORONARY ANGIOGRAM;  Surgeon: [name], MD;  Location: [facility] CATH LAB;  Service: Cardiovascular;  Laterality: N/A;   neck epidural     POLYPECTOMY  09/15/2022   Procedure: POLYPECTOMY;  Surgeon: Daneil Dolin, MD;  [facility];  Service: Endoscopy;;   POSTERIOR LUMBAR FUSION 4 LEVEL N/A 03/28/2018   Procedure: Thoracic eight -Lumbar two- FIXATION WITH SCREW PLACEMENT, DECOMPRESSION Thoracic ten-Thoracic eleven  FOR OSTEOMYELITIS;  Surgeon: Kristeen Miss, MD;  Location: [place];  Service: Neurosurgery;  Laterality: N/A;   SCLEROTHERAPY  09/15/2022   Procedure: SCLEROTHERAPY;  Surgeon: Daneil Dolin, MD;  [facility];  Service: Endoscopy;;   SHOULDER SURGERY Bilateral    SPINAL CORD STIMULATOR REMOVAL N/A 02/25/2020   Procedure: LUMBAR SPINAL CORD STIMULATOR REMOVAL;  Surgeon: Kristeen Miss, MD;  Location: [place];  Service: Neurosurgery;  Laterality: N/A;   TEE WITHOUT CARDIOVERSION N/A 03/21/2018   Procedure: TRANSESOPHAGEAL ECHOCARDIOGRAM (TEE) WITH PROPOFOL;  Surgeon: [name], MD;  [facility];  Service: Cardiovascular;  Laterality: N/A;   Patient Active Problem List   Diagnosis Date Noted   History of diarrhea 05/12/2022   History of colonic polyps 05/12/2022   Bilateral carpal tunnel syndrome 01/08/2022   Carcinoma of prostate ([place]) 01/08/2022   Cough 01/08/2022   Fasciculation 71/05/2693   Follicular cyst of the skin and subcutaneous tissue, unspecified 01/08/2022   Hypertrophy of nasal turbinates 01/08/2022   PTSD (post-traumatic stress disorder) 06/17/2021   Stroke ([place]) 04/29/2021   Infection of spinal cord stimulator ([place]) 02/26/2020   Pseudogout of knee    Pyogenic arthritis of right knee joint ([place])    Falls frequently 05/08/2018   Fall    Sleep disturbance    Post-op pain    Hypokalemia     Morbid obesity ([place])    Postoperative pain    DVT, lower extremity, distal, acute, bilateral (HCC)    Acute blood loss anemia    Anemia of chronic disease    Essential hypertension    Bacteremia    Myelopathy ([place]) 03/30/2018   Paraplegia (H[facility])    Postlaminectomy syndrome, lumbar region    Epidural abscess    Abdominal distension    Encephalopathy    Weakness of both lower extremities    Discitis of thoracic region    Spinal cord stimulator status    Staphylococcus aureus sepsis ([place]) 03/20/2018   Bacteremia due to methicillin susceptible Staphylococcus aureus (MSSA) 03/18/2018   Obesity 03/18/2018   Nephrolithiasis 03/16/2018   AKI (acute kidney injury) ([place]) 03/16/2018   Cognitive impairment 03/16/2018   Chronic pain syndrome 03/16/2018   BPH (benign prostatic hyperplasia) 01/17/2018   Insomnia 01/17/2018   Discogenic cervical pain 10/13/2017   Cervico-occipital neuralgia 06/09/2016   Cervicogenic headache 01/28/2016   B12 deficiency 01/28/2016   Cervical radiculopathy 08/14/2015   Memory difficulty 07/23/2015  History of cerebrovascular disease 07/23/2015   FH: colon cancer 08/13/2014   Hx of adenomatous colonic polyps 08/13/2014   Right knee pain 08/04/2014   Depression 06/02/2012   Chronic back pain 05/30/2012   CAD, NATIVE VESSEL 07/17/2010   HYPERCHOLESTEROLEMIA 10/31/2009   SMOKELESS TOBACCO ABUSE 10/31/2009   Obstructive sleep apnea 10/31/2009   Essential hypertension, benign 10/31/2009   GERD 10/31/2009    PCP: Asencion Noble, MD  REFERRING PROVIDER: Asencion Noble, MD  REFERRING DIAG: PT eval/tx for hip pain due to fall  THERAPY DIAG:  Pain in right hip - Plan: PT plan of care cert/re-cert  Muscle weakness (generalized) - Plan: PT plan of care cert/re-cert  Rationale for Evaluation and Treatment: Rehabilitation  ONSET DATE: 10/31/22  SUBJECTIVE:   SUBJECTIVE STATEMENT: Patient presents to therapy with complaint of RT hip pain after a fall last month.  He went to ER and had imaging which was unremarkable, though imaging impression states "plain radiographs are significantly insensitive for hip and pelvic fracture". His hip is very stiff now. He would like to get it moving again.   PERTINENT HISTORY: Hx of CVA, chronic LBP, paraplegia   PAIN:  Are you having pain? Yes: NPRS scale: 0/10 at rest; standing 8-9/10 Pain location: Rt hip  Pain description: sore, aching  Aggravating factors: standing, WB Relieving factors: Movement, non WB  PRECAUTIONS: Fall  WEIGHT BEARING RESTRICTIONS: No  FALLS:  Has patient fallen in last 6 months? Yes. Number of falls 5  LIVING ENVIRONMENT: Lives with: lives with their partner Lives in: House/apartment Stairs: No Has following equipment at home: Gilford Rile - 2 wheeled and Wheelchair (power)  OCCUPATION: Retired  PLOF: Needs assistance with ADLs, Needs assistance with homemaking, and Needs assistance with transfers  PATIENT GOALS: Get hip moving again   NEXT MD VISIT:   OBJECTIVE:   DIAGNOSTIC FINDINGS: IMPRESSION: No evidence of displaced hip or pelvic fracture. Please note that plain radiographs are significantly insensitive for hip and pelvic fracture, particularly in the setting of osteopenia.  PATIENT SURVEYS:  FOTO Complete next session   COGNITION: Overall cognitive status: Within functional limits for tasks assessed    PALPATION: No notable TTP about RT hip   LOWER EXTREMITY ROM:  Active ROM Right eval Left eval  Hip flexion 85 95  Hip extension 0 0  Hip abduction    Hip adduction    Hip internal rotation    Hip external rotation    Knee flexion    Knee extension    Ankle dorsiflexion    Ankle plantarflexion    Ankle inversion    Ankle eversion     (Blank rows = not tested)  LOWER EXTREMITY MMT:  MMT Right eval Left eval  Hip flexion 4 4  Hip extension    Hip abduction 3- 4-  Hip adduction    Hip internal rotation    Hip external rotation    Knee  flexion    Knee extension 3+ 4-  Ankle dorsiflexion 4+ 4+  Ankle plantarflexion    Ankle inversion    Ankle eversion     (Blank rows = not tested)  TODAY'S TREATMENT:  DATE:  11/25/22 Eval  Supine clam Glute set LTR  Hip adduction iso    PATIENT EDUCATION:  Education details: on eval findings, POC and HEP  Person educated: Patient Education method: Explanation Education comprehension: verbalized understanding  HOME EXERCISE PROGRAM: Access Code: [email or web address] URL: [email or web address] Date: 11/25/2022 Prepared by: Josue Hector  Exercises - Hooklying Gluteal Sets  - 2-3 x daily - 7 x weekly - 1 sets - 10 reps - 5 second hold - Supine Hip Adduction Isometric with Ball  - 2-3 x daily - 7 x weekly - 1 sets - 10 reps - 5 second hold - Hooklying Clamshell with Resistance  - 2-3 x daily - 7 x weekly - 1 sets - 10 reps - 5 second hold - Supine Lower Trunk Rotation  - 2-3 x daily - 7 x weekly - 1 sets - 10 reps - 5 second hold  ASSESSMENT:  CLINICAL IMPRESSION: Patient is a 76 y.o. male who presents to physical therapy with complaint of RT hip pain. Patient demonstrates muscle weakness, reduced ROM, and fascial restrictions which are likely contributing to symptoms of pain and are negatively impacting patient ability to perform ADLs and functional mobility tasks. Patient will benefit from skilled physical therapy services to address these deficits to reduce pain and improve level of function with ADLs and functional mobility tasks.   OBJECTIVE IMPAIRMENTS: decreased activity tolerance, decreased mobility, difficulty walking, decreased ROM, decreased strength, hypomobility, increased fascial restrictions, impaired flexibility, improper body mechanics, and pain.   ACTIVITY LIMITATIONS: standing, transfers, bed mobility, and locomotion  level  PARTICIPATION LIMITATIONS: meal prep, cleaning, laundry, shopping, and community activity  PERSONAL FACTORS: Age, Time since onset of injury/illness/exacerbation, and 1-2 comorbidities: See above  are also affecting patient's functional outcome.   REHAB POTENTIAL: Good  CLINICAL DECISION MAKING: Stable/uncomplicated  EVALUATION COMPLEXITY: Low   GOALS: SHORT TERM GOALS: Target date: 12/16/2022  Patient will be independent with initial HEP and self-management strategies to improve functional outcomes Baseline:  Goal status: INITIAL   LONG TERM GOALS: Target date: 01/06/2023  Patient will be independent with advanced HEP and self-management strategies to improve functional outcomes Baseline:  Goal status: INITIAL  2.  Patient will improve FOTO score to predicted value to indicate improvement in functional outcomes Baseline: Complete next session  Goal status: INITIAL  3.  Patient will have RT hip flexion AROM within 5 degrees of contralateral side to improve functional mobility and ability to perform ADLs. Baseline: See AROM Goal status: INITIAL  4. Patient will have equal to or > 4/5 MMT throughout RLE to improve ability to perform functional mobility, stair ambulation and ADLs.  Baseline: See MMT Goal status: INITIAL   PLAN:  PT FREQUENCY: 2x/week  PT DURATION: 6 weeks  PLANNED INTERVENTIONS: Therapeutic exercises, Therapeutic activity, Neuromuscular re-education, Balance training, Gait training, Patient/Family education, Joint manipulation, Joint mobilization, Stair training, Aquatic Therapy, Dry Needling, Electrical stimulation, Spinal manipulation, Spinal mobilization, Cryotherapy, Moist heat, scar mobilization, Taping, Traction, Ultrasound, Biofeedback, Ionotophoresis '4mg'$ /ml Dexamethasone, and Manual therapy.   PLAN FOR NEXT SESSION: Complete FOTO. Progress glute and quad strength. Progress pain free RT hip AROM as tolerated.  3:24 PM, 11/25/22 Josue Hector PT DPT  Physical Therapist with [facility]  [phone]

## 2022-11-29 NOTE — Progress Notes (Unsigned)
History of Present Illness:   TRUS/Bx March, 2016. At that time, 2 cores at the left apex revealed relatively low volume GS 3+3 pattern. Prostatic volume 65 ml. PSA 4.1. PSAD 0.06. Oncotype DX was performed at that time, as we anticipated active surveillance. This revealed a GPS score 11, favorable/low risk prostate cancer. Less than 5% chance of having high-grade disease/extraprostatic disease.  Repeat TRUS/Bx in February, 2017. Only one core at the left apex at that time revealed GS 3+3 , only 5% of core involved.   He was started on finasteride on 4.25.2016.   2.5.2023: MRI prostate.  Volume 50 mL.  No extra prostatic abnormalities noted.  Prostate was normal with PI-RADS category 1 appearance.   12.12.2023: Most recent PSA last year 1.3.  He is still on finasteride and alfuzosin.  His girlfriend says that his urine has been smelly.      ED:   6.16.2020: He first stated noticing pain on approximately 03/21/2011. His symptoms did begin gradually. His symptoms have been stable over the last year. He does not have difficulties achieving an erection. He does have problems maintaining his erections. His erections are straight. He does not have trouble reaching climax.   PDE 5 inhibitors have not worked. He is not currently on med Rx for ED.       Past Medical History:  Diagnosis Date   Allergic rhinitis    Arthritis    B12 deficiency    Cervicogenic headache    Childhood asthma    Chronic back pain    Coronary atherosclerosis of native coronary artery    Mild nonobstructive CAD at catheterization January 2015   Dementia [facility])    Depression    Essential hypertension    Falls    GERD (gastroesophageal reflux disease)    Glaucoma    History of blood transfusion    History of cerebrovascular disease 07/23/2015   History of kidney stones    History of pneumonia 02/2011   Hyperlipidemia    OSA (obstructive sleep apnea)    CPAP - Dr. [name] - uses cpap every night   Pneumonia     Prostate cancer [facility])    PTSD (post-traumatic stress disorder)    [place]   Rectal bleeding    Stroke ([place]) 03/2021   Wheelchair bound     Past Surgical History:  Procedure Laterality Date   APPLICATION OF ROBOTIC ASSISTANCE FOR SPINAL PROCEDURE N/A 03/28/2018   Procedure: APPLICATION OF ROBOTIC ASSISTANCE FOR SPINAL PROCEDURE;  Surgeon: Kristeen Miss, MD;  Location: [place];  Service: Neurosurgery;  Laterality: N/A;   BACK SURGERY  02/14/2012   lumbar OR #7; "today redid L1L2; replaced screws; added bone from hip"   BILATERAL KNEE ARTHROSCOPY     COLONOSCOPY  10/15/2008   Dr. [name]: tubular adenoma    COLONOSCOPY  12/17/2003   HYI:FOYDXA rectal and colon   COLONOSCOPY N/A 09/05/2014   tubular adenoma   COLONOSCOPY WITH PROPOFOL N/A 09/15/2022   Multiple 3-25 mm polyps in ascending colon and cecum, markedly elongated and redundant colon. One polyp removed piecemeal fashion. 6 month surveillance recommended. Tubular adenomas.   CYSTOSCOPY WITH STENT PLACEMENT Right 01/27/2016   Procedure: CYSTOSCOPY WITH STENT PLACEMENT;  Surgeon: [name], MD;  [facility];  Service: Urology;  Laterality: Right;   CYSTOSCOPY/RETROGRADE/URETEROSCOPY/STONE EXTRACTION WITH BASKET Right 01/27/2016   Procedure: CYSTOSCOPY, RIGHT RETROGRADE, RIGHT URETEROSCOPY, STONE EXTRACTION ;  Surgeon: [name], MD;  [facility];  Service: Urology;  Laterality:  Right;   ESOPHAGOGASTRODUODENOSCOPY  10/15/2008   Dr Rourk:Schatzki's ring status post dilation and disruption via 55 F Maloney dilator/ otherwise unremarkable esophagus, small hiatal hernia, multiple fundal gland polyps not manipulated, gastritis, negative H.pylori   ESOPHAGOGASTRODUODENOSCOPY  06/21/2002   NWG:NFAOZ sliding hiatal hernia with mild changes of reflux esophagitis limited to gastroesophageal junction.  Noncritical ring at distal esophagus, 3 cm proximal to gastroesophageal junction/Antral gastritis   FACIAL COSMETIC SURGERY   ` 1985   "broke face playing softball"   FLEXIBLE SIGMOIDOSCOPY N/A 07/28/2022   Procedure: FLEXIBLE SIGMOIDOSCOPY;  Surgeon: Daneil Dolin, MD;  [facility];  Service: Endoscopy;  Laterality: N/A;   FRACTURE SURGERY     "left wrist; broke it; took spur off"   HOLMIUM LASER APPLICATION Right 30/86/5784   Procedure: HOLMIUM LASER APPLICATION;  Surgeon: [name], MD;  [facility]: [facility];  Service: Urology;  Laterality: Right;   KNEE ARTHROSCOPY Right 05/18/2018   Procedure: PARTIAL MEDIAL MENISECTOMY AND SURGICAL LAVAGE AND CHONDROPLASTY;  Surgeon: [name], MD;  Location: [place];  Service: Orthopedics;  Laterality: Right;   LEFT HEART CATHETERIZATION WITH CORONARY ANGIOGRAM N/A 12/27/2013   Procedure: LEFT HEART CATHETERIZATION WITH CORONARY ANGIOGRAM;  Surgeon: [name], MD;  Location[facility] CATH LAB;  Service: Cardiovascular;  Laterality: N/A;   neck epidural     POLYPECTOMY  09/15/2022   Procedure: POLYPECTOMY;  Surgeon: Daneil Dolin, MD;  [facility];  Service: Endoscopy;;   POSTERIOR LUMBAR FUSION 4 LEVEL N/A 03/28/2018   Procedure: Thoracic eight -Lumbar two- FIXATION WITH SCREW PLACEMENT, DECOMPRESSION Thoracic ten-Thoracic eleven  FOR OSTEOMYELITIS;  Surgeon: Kristeen Miss, MD;  Location: [place];  Service: Neurosurgery;  Laterality: N/A;   SCLEROTHERAPY  09/15/2022   Procedure: SCLEROTHERAPY;  Surgeon: Daneil Dolin, MD;  [facility];  Service: Endoscopy;;   SHOULDER SURGERY Bilateral    SPINAL CORD STIMULATOR REMOVAL N/A 02/25/2020   Procedure: LUMBAR SPINAL CORD STIMULATOR REMOVAL;  Surgeon: Kristeen Miss, MD;  Location: [place];  Service: Neurosurgery;  Laterality: N/A;   TEE WITHOUT CARDIOVERSION N/A 03/21/2018   Procedure: TRANSESOPHAGEAL ECHOCARDIOGRAM (TEE) WITH PROPOFOL;  Surgeon: [name], MD;  [facility];  Service: Cardiovascular;  Laterality: N/A;    Home Medications:  Allergies as of  11/30/2022       Reactions   Lisinopril Cough        Medication List        Accurate as of November 29, 2022  8:46 AM. If you have any questions, ask your nurse or doctor.          acidophilus Caps capsule Take 1 capsule by mouth daily.   alfuzosin 10 MG 24 hr tablet Commonly known as: UROXATRAL Take 10 mg by mouth daily with breakfast.   amLODipine 5 MG tablet Commonly known as: NORVASC Take 2.5 mg by mouth daily.   baclofen 10 MG tablet Commonly known as: LIORESAL TAKE ONE TABLET IN THE MORNING AND MIDDAY, THEN TWO TABLETS AT NIGHT What changed:  how much to take how to take this when to take this additional instructions   clopidogrel 75 MG tablet Commonly known as: PLAVIX Take 1 tablet (75 mg total) by mouth daily.   cyanocobalamin 500 MCG tablet Commonly known as: VITAMIN B12 Take 500 mcg by mouth daily.   finasteride 5 MG tablet Commonly known as: PROSCAR Take 1 tablet (5 mg total) by mouth daily. Reported on 05/04/2016   fluticasone 50  MCG/ACT nasal spray Commonly known as: FLONASE Place 1 spray into both nostrils daily as needed for allergies.   gabapentin 400 MG capsule Commonly known as: NEURONTIN Take 1 capsule (400 mg total) by mouth 2 (two) times daily.   losartan 100 MG tablet Commonly known as: COZAAR Take 100 mg by mouth daily.   omeprazole 20 MG capsule Commonly known as: PRILOSEC Take 2 capsules (40 mg total) by mouth daily.   oxyCODONE-acetaminophen 5-325 MG tablet Commonly known as: PERCOCET/ROXICET Take 1 tablet by mouth 2 (two) times daily as needed for pain.   potassium chloride SA 20 MEQ tablet Commonly known as: KLOR-CON M Take 20 mEq by mouth daily.   rosuvastatin 20 MG tablet Commonly known as: CRESTOR Take 20 mg by mouth daily.   traZODone 100 MG tablet Commonly known as: DESYREL Take 1 tablet (100 mg total) by mouth at bedtime.   venlafaxine XR 150 MG 24 hr capsule Commonly known as: EFFEXOR-XR Take 1  capsule (150 mg total) by mouth 2 (two) times daily.   Vitamin D 50 MCG (2000 UT) Caps Take 2,000 Units by mouth daily.   Xiidra 5 % Soln Generic drug: Lifitegrast Place 1 drop into both eyes in the morning and at bedtime.        Allergies:  Allergies  Allergen Reactions   Lisinopril Cough    Family History  Problem Relation Age of Onset   Emphysema Father    Heart failure Father    Lung cancer Father    CAD Father    Colon cancer Mother    Stroke Mother    Breast cancer Mother    Stroke Sister    Heart attack Brother    Dementia Paternal Uncle    Emphysema Maternal Grandmother    Stroke Maternal Grandmother    Asthma Other        grandson   Heart disease Paternal Grandfather    Anesthesia problems Neg Hx    Hypotension Neg Hx    Malignant hyperthermia Neg Hx    Pseudochol deficiency Neg Hx     Social History:  reports that he quit smoking about 63 years ago. His smoking use included cigarettes. His smokeless tobacco use includes chew. He reports that he does not currently use alcohol. He reports that he does not use drugs.  ROS: A complete review of systems was performed.  All systems are negative except for pertinent findings as noted.  Physical Exam:  Vital signs in last 24 hours: There were no vitals taken for this visit. Constitutional:  Alert and oriented, No acute distress Cardiovascular: Regular rate  Respiratory: Normal respiratory effort Neurologic: Grossly intact, no focal deficits Psychiatric: Normal mood and affect  I have reviewed prior pt notes  I have independently reviewed prior imaging--MRI results  I have reviewed prior PSA results  I have reviewed prior pathology results   Impression/Assessment:  1.  Grade group 1 prostate cancer, diagnosed in 2016.  Very low volume.  On active surveillance.  MRI earlier this year revealed only PI-RADS category 1 appearance.  2.  BPH, on maximal medical therapy with stable urinary symptoms  3.   Rule out UTI  Plan:  1.  I will have them drop off the urine later on this week  2.  PSA is checked today  3.  Unless significant change, I will just see him on an annual basis

## 2022-11-30 ENCOUNTER — Encounter: Payer: Self-pay | Admitting: Urology

## 2022-11-30 ENCOUNTER — Ambulatory Visit (INDEPENDENT_AMBULATORY_CARE_PROVIDER_SITE_OTHER): Payer: Medicare Other | Admitting: Urology

## 2022-11-30 VITALS — BP 121/74 | HR 77 | Ht 75.0 in | Wt 290.0 lb

## 2022-11-30 DIAGNOSIS — R8290 Unspecified abnormal findings in urine: Secondary | ICD-10-CM | POA: Diagnosis not present

## 2022-11-30 DIAGNOSIS — I2510 Atherosclerotic heart disease of native coronary artery without angina pectoris: Secondary | ICD-10-CM

## 2022-11-30 DIAGNOSIS — N401 Enlarged prostate with lower urinary tract symptoms: Secondary | ICD-10-CM | POA: Diagnosis not present

## 2022-11-30 DIAGNOSIS — C61 Malignant neoplasm of prostate: Secondary | ICD-10-CM | POA: Diagnosis not present

## 2022-11-30 DIAGNOSIS — N138 Other obstructive and reflux uropathy: Secondary | ICD-10-CM

## 2022-12-01 LAB — PSA: Prostate Specific Ag, Serum: 1.4 ng/mL (ref 0.0–4.0)

## 2022-12-02 ENCOUNTER — Ambulatory Visit (HOSPITAL_COMMUNITY): Payer: Medicare Other

## 2022-12-02 ENCOUNTER — Encounter (HOSPITAL_COMMUNITY): Payer: Self-pay

## 2022-12-02 DIAGNOSIS — I498 Other specified cardiac arrhythmias: Secondary | ICD-10-CM | POA: Diagnosis not present

## 2022-12-02 DIAGNOSIS — M25551 Pain in right hip: Secondary | ICD-10-CM | POA: Diagnosis not present

## 2022-12-02 DIAGNOSIS — M6281 Muscle weakness (generalized): Secondary | ICD-10-CM

## 2022-12-02 DIAGNOSIS — R2689 Other abnormalities of gait and mobility: Secondary | ICD-10-CM

## 2022-12-02 DIAGNOSIS — R262 Difficulty in walking, not elsewhere classified: Secondary | ICD-10-CM | POA: Diagnosis not present

## 2022-12-02 DIAGNOSIS — I2510 Atherosclerotic heart disease of native coronary artery without angina pectoris: Secondary | ICD-10-CM | POA: Diagnosis not present

## 2022-12-02 NOTE — Therapy (Signed)
OUTPATIENT PHYSICAL THERAPY LOWER EXTREMITY EVALUATION   Patient Name: Cody Velez MRN: [id number] DOB:[birth year], 76 y.o., male Today's Date: 12/02/2022  END OF SESSION:  PT End of Session - 12/02/22 1641     Visit Number 2    Number of Visits 12    Date for PT Re-Evaluation 01/06/23    Authorization Type Medicare A/ BCBS Federal 2ndary    Progress Note Due on Visit 10    PT Start Time 1606    PT Stop Time 1649    PT Time Calculation (min) 43 min    Activity Tolerance Patient tolerated treatment well    Behavior During Therapy WFL for tasks assessed/performed              Past Medical History:  Diagnosis Date   Allergic rhinitis    Arthritis    B12 deficiency    Cervicogenic headache    Childhood asthma    Chronic back pain    Coronary atherosclerosis of native coronary artery    Mild nonobstructive CAD at catheterization January 2015   Dementia [facility])    Depression    Essential hypertension    Falls    GERD (gastroesophageal reflux disease)    Glaucoma    History of blood transfusion    History of cerebrovascular disease 07/23/2015   History of kidney stones    History of pneumonia 02/2011   Hyperlipidemia    OSA (obstructive sleep apnea)    CPAP - Dr. [name] - uses cpap every night   Pneumonia    Prostate cancer ([place])    PTSD (post-traumatic stress disorder)    [place]   Rectal bleeding    Stroke ([place]) 03/2021   Wheelchair bound    Past Surgical History:  Procedure Laterality Date   APPLICATION OF ROBOTIC ASSISTANCE FOR SPINAL PROCEDURE N/A 03/28/2018   Procedure: APPLICATION OF ROBOTIC ASSISTANCE FOR SPINAL PROCEDURE;  Surgeon: Kristeen Miss, MD;  Location: [place];  Service: Neurosurgery;  Laterality: N/A;   BACK SURGERY  02/14/2012   lumbar OR #7; "today redid L1L2; replaced screws; added bone from hip"   BILATERAL KNEE ARTHROSCOPY     COLONOSCOPY  10/15/2008   Dr. [name]: tubular adenoma    COLONOSCOPY  12/17/2003   [facility]:WEXHBZ rectal and  colon   COLONOSCOPY N/A 09/05/2014   tubular adenoma   COLONOSCOPY WITH PROPOFOL N/A 09/15/2022   Multiple 3-25 mm polyps in ascending colon and cecum, markedly elongated and redundant colon. One polyp removed piecemeal fashion. 6 month surveillance recommended. Tubular adenomas.   CYSTOSCOPY WITH STENT PLACEMENT Right 01/27/2016   Procedure: CYSTOSCOPY WITH STENT PLACEMENT;  Surgeon: [name], MD;  Location: [facility];  Service: Urology;  Laterality: Right;   CYSTOSCOPY/RETROGRADE/URETEROSCOPY/STONE EXTRACTION WITH BASKET Right 01/27/2016   Procedure: CYSTOSCOPY, RIGHT RETROGRADE, RIGHT URETEROSCOPY, STONE EXTRACTION ;  Surgeon: [name], MD;  Location: [facility];  Service: Urology;  Laterality: Right;   ESOPHAGOGASTRODUODENOSCOPY  10/15/2008   Dr Rourk:Schatzki's ring status post dilation and disruption via 74 F Maloney dilator/ otherwise unremarkable esophagus, small hiatal hernia, multiple fundal gland polyps not manipulated, gastritis, negative H.pylori   ESOPHAGOGASTRODUODENOSCOPY  06/21/2002   JIR:CVELF sliding hiatal hernia with mild changes of reflux esophagitis limited to gastroesophageal junction.  Noncritical ring at distal esophagus, 3 cm proximal to gastroesophageal junction/Antral gastritis   FACIAL COSMETIC SURGERY  ` 1985   "broke face playing softball"   FLEXIBLE SIGMOIDOSCOPY N/A 07/28/2022   Procedure: FLEXIBLE SIGMOIDOSCOPY;  Surgeon: Daneil Dolin, MD;  [facility];  Service: Endoscopy;  Laterality: N/A;   FRACTURE SURGERY     "left wrist; broke it; took spur off"   HOLMIUM LASER APPLICATION Right 60/73/7106   Procedure: HOLMIUM LASER APPLICATION;  Surgeon: [name], MD;  Location: [facility];  Service: Urology;  Laterality: Right;   KNEE ARTHROSCOPY Right 05/18/2018   Procedure: PARTIAL MEDIAL MENISECTOMY AND SURGICAL LAVAGE AND CHONDROPLASTY;  Surgeon: [name], MD;  Location: [place];  Service: Orthopedics;  Laterality: Right;   LEFT  HEART CATHETERIZATION WITH CORONARY ANGIOGRAM N/A 12/27/2013   Procedure: LEFT HEART CATHETERIZATION WITH CORONARY ANGIOGRAM;  Surgeon: [name], MD;  Location: [facility] C[facility];  Service: Cardiovascular;  Laterality: N/A;   neck epidural     POLYPECTOMY  09/15/2022   Procedure: POLYPECTOMY;  Surgeon: Daneil Dolin, MD;  [facility];  Service: Endoscopy;;   POSTERIOR LUMBAR FUSION 4 LEVEL N/A 03/28/2018   Procedure: Thoracic eight -Lumbar two- FIXATION WITH SCREW PLACEMENT, DECOMPRESSION Thoracic ten-Thoracic eleven  FOR OSTEOMYELITIS;  Surgeon: Kristeen Miss, MD;  Location: [place];  Service: Neurosurgery;  Laterality: N/A;   SCLEROTHERAPY  09/15/2022   Procedure: SCLEROTHERAPY;  Surgeon: Daneil Dolin, MD;  [facility];  Service: Endoscopy;;   SHOULDER SURGERY Bilateral    SPINAL CORD STIMULATOR REMOVAL N/A 02/25/2020   Procedure: LUMBAR SPINAL CORD STIMULATOR REMOVAL;  Surgeon: Kristeen Miss, MD;  Location: [place];  Service: Neurosurgery;  Laterality: N/A;   TEE WITHOUT CARDIOVERSION N/A 03/21/2018   Procedure: TRANSESOPHAGEAL ECHOCARDIOGRAM (TEE) WITH PROPOFOL;  Surgeon: [name], MD;  [facility];  Service: Cardiovascular;  Laterality: N/A;   Patient Active Problem List   Diagnosis Date Noted   History of diarrhea 05/12/2022   History of colonic polyps 05/12/2022   Bilateral carpal tunnel syndrome 01/08/2022   Carcinoma of prostate ([place]) 01/08/2022   Cough 01/08/2022   Fasciculation 26/94/8546   Follicular cyst of the skin and subcutaneous tissue, unspecified 01/08/2022   Hypertrophy of nasal turbinates 01/08/2022   PTSD (post-traumatic stress disorder) 06/17/2021   Stroke ([place]) 04/29/2021   Infection of spinal cord stimulator ([place]) 02/26/2020   Pseudogout of knee    Pyogenic arthritis of right knee joint ([place])    Falls frequently 05/08/2018   Fall    Sleep disturbance    Post-op pain    Hypokalemia    Morbid obesity ([place])     Postoperative pain    DVT, lower extremity, distal, acute, bilateral ([facility])    Acute blood loss anemia    Anemia of chronic disease    Essential hypertension    Bacteremia    Myelopathy ([place]) 03/30/2018   Paraplegia ([facility])    Postlaminectomy syndrome, lumbar region    Epidural abscess    Abdominal distension    Encephalopathy    Weakness of both lower extremities    Discitis of thoracic region    Spinal cord stimulator status    Staphylococcus aureus sepsis ([place]) 03/20/2018   Bacteremia due to methicillin susceptible Staphylococcus aureus (MSSA) 03/18/2018   Obesity 03/18/2018   Nephrolithiasis 03/16/2018   AKI (acute kidney injury) ([place]) 03/16/2018   Cognitive impairment 03/16/2018   Chronic pain syndrome 03/16/2018   BPH (benign prostatic hyperplasia) 01/17/2018   Insomnia 01/17/2018   Discogenic cervical pain 10/13/2017   Cervico-occipital neuralgia 06/09/2016   Cervicogenic headache 01/28/2016   B12 deficiency 01/28/2016   Cervical radiculopathy 08/14/2015   Memory difficulty 07/23/2015   History of cerebrovascular  disease 07/23/2015   FH: colon cancer 08/13/2014   Hx of adenomatous colonic polyps 08/13/2014   Right knee pain 08/04/2014   Depression 06/02/2012   Chronic back pain 05/30/2012   CAD, NATIVE VESSEL 07/17/2010   HYPERCHOLESTEROLEMIA 10/31/2009   SMOKELESS TOBACCO ABUSE 10/31/2009   Obstructive sleep apnea 10/31/2009   Essential hypertension, benign 10/31/2009   GERD 10/31/2009    PCP: Asencion Noble, MD  REFERRING PROVIDER: Asencion Noble, MD  REFERRING DIAG: PT eval/tx for hip pain due to fall  THERAPY DIAG:  Pain in right hip  Other abnormalities of gait and mobility  Muscle weakness (generalized)  Difficulty in walking, not elsewhere classified  Rationale for Evaluation and Treatment: Rehabilitation  ONSET DATE: 10/31/22  SUBJECTIVE:   SUBJECTIVE STATEMENT:   Eval subjective:  Patient presents to therapy with complaint of RT hip pain after  a fall last month. He went to ER and had imaging which was unremarkable, though imaging impression states "plain radiographs are significantly insensitive for hip and pelvic fracture". His hip is very stiff now. He would like to get it moving again.   PERTINENT HISTORY: Hx of CVA, chronic LBP, paraplegia   PAIN:  Are you having pain? Yes: NPRS scale: 0/10 at rest; standing 8-9/10 Pain location: Rt hip  Pain description: sore, aching  Aggravating factors: standing, WB Relieving factors: Movement, non WB  PRECAUTIONS: Fall  WEIGHT BEARING RESTRICTIONS: No  FALLS:  Has patient fallen in last 6 months? Yes. Number of falls 5  LIVING ENVIRONMENT: Lives with: lives with their partner Lives in: House/apartment Stairs: No Has following equipment at home: Gilford Rile - 2 wheeled and Wheelchair (power)  OCCUPATION: Retired  PLOF: Needs assistance with ADLs, Needs assistance with homemaking, and Needs assistance with transfers  PATIENT GOALS: Get hip moving again   NEXT MD VISIT:   OBJECTIVE:   DIAGNOSTIC FINDINGS: IMPRESSION: No evidence of displaced hip or pelvic fracture. Please note that plain radiographs are significantly insensitive for hip and pelvic fracture, particularly in the setting of osteopenia.  PATIENT SURVEYS:  FOTO Complete next session   COGNITION: Overall cognitive status: Within functional limits for tasks assessed    PALPATION: No notable TTP about RT hip   LOWER EXTREMITY ROM:  Active ROM Right eval Left eval  Hip flexion 85 95  Hip extension 0 0  Hip abduction    Hip adduction    Hip internal rotation    Hip external rotation    Knee flexion    Knee extension    Ankle dorsiflexion    Ankle plantarflexion    Ankle inversion    Ankle eversion     (Blank rows = not tested)  LOWER EXTREMITY MMT:  MMT Right eval Left eval  Hip flexion 4 4  Hip extension    Hip abduction 3- 4-  Hip adduction    Hip internal rotation    Hip external  rotation    Knee flexion    Knee extension 3+ 4-  Ankle dorsiflexion 4+ 4+  Ankle plantarflexion    Ankle inversion    Ankle eversion     (Blank rows = not tested)  TODAY'S TREATMENT:  DATE:  12/02/22: Reviewed goals FOTO Supine: Glut sets 10 x5" LTR 10x 10" Clam with GTB 10x 5" Adduction with ball 10x5" Bridge with adduction ball 10x 5"  11/25/22 Eval  Supine clam Glute set LTR  Hip adduction iso    PATIENT EDUCATION:  Education details: on eval findings, POC and HEP  Person educated: Patient Education method: Explanation Education comprehension: verbalized understanding  HOME EXERCISE PROGRAM: Access Code: [email or web address] URL: [email or web address] Date: 11/25/2022 Prepared by: Josue Hector  Exercises - Hooklying Gluteal Sets  - 2-3 x daily - 7 x weekly - 1 sets - 10 reps - 5 second hold - Supine Hip Adduction Isometric with Ball  - 2-3 x daily - 7 x weekly - 1 sets - 10 reps - 5 second hold - Hooklying Clamshell with Resistance  - 2-3 x daily - 7 x weekly - 1 sets - 10 reps - 5 second hold - Supine Lower Trunk Rotation  - 2-3 x daily - 7 x weekly - 1 sets - 10 reps - 5 second hold  12/02/22:  Bridge with adduction  ASSESSMENT:  CLINICAL IMPRESSION: Patient is a 76 y.o. male who presents to physical therapy with complaint of RT hip pain. Patient demonstrates muscle weakness, reduced ROM, and fascial restrictions which are likely contributing to symptoms of pain and are negatively impacting patient ability to perform ADLs and functional mobility tasks. Patient will benefit from skilled physical therapy services to address these deficits to reduce pain and improve level of function with ADLs and functional mobility tasks.  Reviewed goals, educated importance of HEP compliance for maximal benefits.  Pt able to recall current HEP,  cueing to improved mechanics and stability with exercises (especially with clam exercise).  FOTO complete with score 53% functional indicating decreased self perceived functional ability.  Session focus with hip mobility and proximal strengthening with cueing through session to improve stability for maximal musculature activation.  Pt required multiple cues to stay on task as tendency to rotate hips to Rt throughout session.  No reports of increased pain through session.     OBJECTIVE IMPAIRMENTS: decreased activity tolerance, decreased mobility, difficulty walking, decreased ROM, decreased strength, hypomobility, increased fascial restrictions, impaired flexibility, improper body mechanics, and pain.   ACTIVITY LIMITATIONS: standing, transfers, bed mobility, and locomotion level  PARTICIPATION LIMITATIONS: meal prep, cleaning, laundry, shopping, and community activity  PERSONAL FACTORS: Age, Time since onset of injury/illness/exacerbation, and 1-2 comorbidities: See above  are also affecting patient's functional outcome.   REHAB POTENTIAL: Good  CLINICAL DECISION MAKING: Stable/uncomplicated  EVALUATION COMPLEXITY: Low   GOALS: SHORT TERM GOALS: Target date: 12/16/2022  Patient will be independent with initial HEP and self-management strategies to improve functional outcomes Baseline:  Goal status: IN PROGRESS   LONG TERM GOALS: Target date: 01/06/2023  Patient will be independent with advanced HEP and self-management strategies to improve functional outcomes Baseline:  Goal status: IN PROGRESS  2.  Patient will improve FOTO score to predicted value to indicate improvement in functional outcomes Baseline: 12/02/22: 53% functional Goal status: IN PROGRESS  3.  Patient will have RT hip flexion AROM within 5 degrees of contralateral side to improve functional mobility and ability to perform ADLs. Baseline: See AROM Goal status: IN PROGRESS  4. Patient will have equal to or > 4/5  MMT throughout RLE to improve ability to perform functional mobility, stair ambulation and ADLs.  Baseline: See MMT Goal status: IN PROGRESS   PLAN:  PT FREQUENCY: 2x/week  PT DURATION: 6  weeks  PLANNED INTERVENTIONS: Therapeutic exercises, Therapeutic activity, Neuromuscular re-education, Balance training, Gait training, Patient/Family education, Joint manipulation, Joint mobilization, Stair training, Aquatic Therapy, Dry Needling, Electrical stimulation, Spinal manipulation, Spinal mobilization, Cryotherapy, Moist heat, scar mobilization, Taping, Traction, Ultrasound, Biofeedback, Ionotophoresis '4mg'$ /ml Dexamethasone, and Manual therapy.   PLAN FOR NEXT SESSION:  Progress glute and quad strength. Progress pain free RT hip AROM as tolerated.  Ihor Austin, LPTA/CLT; CBIS [phone]  4:56 PM, 12/02/22

## 2022-12-03 ENCOUNTER — Other Ambulatory Visit: Payer: Medicare Other

## 2022-12-03 DIAGNOSIS — C61 Malignant neoplasm of prostate: Secondary | ICD-10-CM | POA: Diagnosis not present

## 2022-12-03 LAB — URINALYSIS, ROUTINE W REFLEX MICROSCOPIC
Bilirubin, UA: NEGATIVE
Glucose, UA: NEGATIVE
Ketones, UA: NEGATIVE
Nitrite, UA: NEGATIVE
RBC, UA: NEGATIVE
Specific Gravity, UA: 1.015 (ref 1.005–1.030)
Urobilinogen, Ur: 0.2 mg/dL (ref 0.2–1.0)
pH, UA: 5 (ref 5.0–7.5)

## 2022-12-03 LAB — MICROSCOPIC EXAMINATION
Bacteria, UA: NONE SEEN
RBC, Urine: NONE SEEN /hpf (ref 0–2)

## 2022-12-07 ENCOUNTER — Ambulatory Visit (HOSPITAL_COMMUNITY): Payer: Medicare Other

## 2022-12-07 ENCOUNTER — Encounter (HOSPITAL_COMMUNITY): Payer: Self-pay

## 2022-12-07 DIAGNOSIS — M6281 Muscle weakness (generalized): Secondary | ICD-10-CM

## 2022-12-07 DIAGNOSIS — I498 Other specified cardiac arrhythmias: Secondary | ICD-10-CM | POA: Diagnosis not present

## 2022-12-07 DIAGNOSIS — R2689 Other abnormalities of gait and mobility: Secondary | ICD-10-CM

## 2022-12-07 DIAGNOSIS — I2510 Atherosclerotic heart disease of native coronary artery without angina pectoris: Secondary | ICD-10-CM | POA: Diagnosis not present

## 2022-12-07 DIAGNOSIS — R262 Difficulty in walking, not elsewhere classified: Secondary | ICD-10-CM | POA: Diagnosis not present

## 2022-12-07 DIAGNOSIS — M25551 Pain in right hip: Secondary | ICD-10-CM | POA: Diagnosis not present

## 2022-12-07 NOTE — Therapy (Signed)
OUTPATIENT PHYSICAL THERAPY LOWER EXTREMITY TREATMENT   Patient Name: Cody Velez MRN: [id number] DOB:05-17-1946, 76 y.o., male Today's Date: 12/07/2022  END OF SESSION:   12/07/22 1442  PT Visits / Re-Eval  Visit Number 3  Number of Visits 12  Date for PT Re-Evaluation 01/06/23  Authorization  Authorization Type Medicare A/ BCBS Federal 2ndary  Progress Note Due on Visit 10  PT Time Calculation  PT Start Time 1433  PT Stop Time 1512  PT Time Calculation (min) 39 min  PT - End of Session  Activity Tolerance Patient tolerated treatment well  Behavior During Therapy WFL for tasks assessed/performed      Past Medical History:  Diagnosis Date   Allergic rhinitis    Arthritis    B12 deficiency    Cervicogenic headache    Childhood asthma    Chronic back pain    Coronary atherosclerosis of native coronary artery    Mild nonobstructive CAD at catheterization January 2015   Dementia [facility])    Depression    Essential hypertension    Falls    GERD (gastroesophageal reflux disease)    Glaucoma    History of blood transfusion    History of cerebrovascular disease 07/23/2015   History of kidney stones    History of pneumonia 02/2011   Hyperlipidemia    OSA (obstructive sleep apnea)    CPAP - Dr. [name] - uses cpap every night   Pneumonia    Prostate cancer ([place])    PTSD (post-traumatic stress disorder)    [place]   Rectal bleeding    Stroke ([place]) 03/2021   Wheelchair bound    Past Surgical History:  Procedure Laterality Date   APPLICATION OF ROBOTIC ASSISTANCE FOR SPINAL PROCEDURE N/A 03/28/2018   Procedure: APPLICATION OF ROBOTIC ASSISTANCE FOR SPINAL PROCEDURE;  Surgeon: Kristeen Miss, MD;  Location: [place];  Service: Neurosurgery;  Laterality: N/A;   BACK SURGERY  02/14/2012   lumbar OR #7; "today redid L1L2; replaced screws; added bone from hip"   BILATERAL KNEE ARTHROSCOPY     COLONOSCOPY  10/15/2008   Dr. [name]: tubular adenoma    COLONOSCOPY  12/17/2003    MVE:[facility] rectal and colon   COLONOSCOPY N/A 09/05/2014   tubular adenoma   COLONOSCOPY WITH PROPOFOL N/A 09/15/2022   Multiple 3-25 mm polyps in ascending colon and cecum, markedly elongated and redundant colon. One polyp removed piecemeal fashion. 6 month surveillance recommended. Tubular adenomas.   CYSTOSCOPY WITH STENT PLACEMENT Right 01/27/2016   Procedure: CYSTOSCOPY WITH STENT PLACEMENT;  Surgeon: [name], MD;  Location: [facility];  Service: Urology;  Laterality: Right;   CYSTOSCOPY/RETROGRADE/URETEROSCOPY/STONE EXTRACTION WITH BASKET Right 01/27/2016   Procedure: CYSTOSCOPY, RIGHT RETROGRADE, RIGHT URETEROSCOPY, STONE EXTRACTION ;  Surgeon: [name], MD;  Location: [facility];  Service: Urology;  Laterality: Right;   ESOPHAGOGASTRODUODENOSCOPY  10/15/2008   Dr Rourk:Schatzki's ring status post dilation and disruption via [address] dilator/ otherwise unremarkable esophagus, small hiatal hernia, multiple fundal gland polyps not manipulated, gastritis, negative H.pylori   ESOPHAGOGASTRODUODENOSCOPY  06/21/2002   SJG:GEZMO sliding hiatal hernia with mild changes of reflux esophagitis limited to gastroesophageal junction.  Noncritical ring at distal esophagus, 3 cm proximal to gastroesophageal junction/Antral gastritis   FACIAL COSMETIC SURGERY  ` 1985   "broke face playing softball"   FLEXIBLE SIGMOIDOSCOPY N/A 07/28/2022   Procedure: FLEXIBLE SIGMOIDOSCOPY;  Surgeon: Daneil Dolin, MD;  [facility];  Service: Endoscopy;  Laterality: N/A;   FRACTURE SURGERY     "  left wrist; broke it; took spur off"   HOLMIUM LASER APPLICATION Right 92/10/9416   Procedure: HOLMIUM LASER APPLICATION;  Surgeon: [name], MD;  Location: [facility];  Service: Urology;  Laterality: Right;   KNEE ARTHROSCOPY Right 05/18/2018   Procedure: PARTIAL MEDIAL MENISECTOMY AND SURGICAL LAVAGE AND CHONDROPLASTY;  Surgeon: [name], MD;  Location: [place];  Service: Orthopedics;   Laterality: Right;   LEFT HEART CATHETERIZATION WITH CORONARY ANGIOGRAM N/A 12/27/2013   Procedure: LEFT HEART CATHETERIZATION WITH CORONARY ANGIOGRAM;  Surgeon: [name], MD;  Location: [facility] CATH LAB;  Service: Cardiovascular;  Laterality: N/A;   neck epidural     POLYPECTOMY  09/15/2022   Procedure: POLYPECTOMY;  Surgeon: Daneil Dolin, MD;  [facility];  Service: Endoscopy;;   POSTERIOR LUMBAR FUSION 4 LEVEL N/A 03/28/2018   Procedure: Thoracic eight -Lumbar two- FIXATION WITH SCREW PLACEMENT, DECOMPRESSION Thoracic ten-Thoracic eleven  FOR OSTEOMYELITIS;  Surgeon: Kristeen Miss, MD;  Location: [place];  Service: Neurosurgery;  Laterality: N/A;   SCLEROTHERAPY  09/15/2022   Procedure: SCLEROTHERAPY;  Surgeon: Daneil Dolin, MD;  [facility];  Service: Endoscopy;;   SHOULDER SURGERY Bilateral    SPINAL CORD STIMULATOR REMOVAL N/A 02/25/2020   Procedure: LUMBAR SPINAL CORD STIMULATOR REMOVAL;  Surgeon: Kristeen Miss, MD;  Location: [place];  Service: Neurosurgery;  Laterality: N/A;   TEE WITHOUT CARDIOVERSION N/A 03/21/2018   Procedure: TRANSESOPHAGEAL ECHOCARDIOGRAM (TEE) WITH PROPOFOL;  Surgeon: [name], MD;  [facility];  Service: Cardiovascular;  Laterality: N/A;   Patient Active Problem List   Diagnosis Date Noted   History of diarrhea 05/12/2022   History of colonic polyps 05/12/2022   Bilateral carpal tunnel syndrome 01/08/2022   Carcinoma of prostate ([place]) 01/08/2022   Cough 01/08/2022   Fasciculation 40/81/4481   Follicular cyst of the skin and subcutaneous tissue, unspecified 01/08/2022   Hypertrophy of nasal turbinates 01/08/2022   PTSD (post-traumatic stress disorder) 06/17/2021   Stroke ([place]) 04/29/2021   Infection of spinal cord stimulator ([place]) 02/26/2020   Pseudogout of knee    Pyogenic arthritis of right knee joint ([place])    Falls frequently 05/08/2018   Fall    Sleep disturbance    Post-op pain    Hypokalemia     Morbid obesity ([place])    Postoperative pain    DVT, lower extremity, distal, acute, bilateral ([facility])    Acute blood loss anemia    Anemia of chronic disease    Essential hypertension    Bacteremia    Myelopathy ([place]) 03/30/2018   Paraplegia ([facility])    Postlaminectomy syndrome, lumbar region    Epidural abscess    Abdominal distension    Encephalopathy    Weakness of both lower extremities    Discitis of thoracic region    Spinal cord stimulator status    Staphylococcus aureus sepsis ([place]) 03/20/2018   Bacteremia due to methicillin susceptible Staphylococcus aureus (MSSA) 03/18/2018   Obesity 03/18/2018   Nephrolithiasis 03/16/2018   AKI (acute kidney injury) ([place]) 03/16/2018   Cognitive impairment 03/16/2018   Chronic pain syndrome 03/16/2018   BPH (benign prostatic hyperplasia) 01/17/2018   Insomnia 01/17/2018   Discogenic cervical pain 10/13/2017   Cervico-occipital neuralgia 06/09/2016   Cervicogenic headache 01/28/2016   B12 deficiency 01/28/2016   Cervical radiculopathy 08/14/2015   Memory difficulty 07/23/2015   History of cerebrovascular disease 07/23/2015   FH: colon cancer 08/13/2014   Hx of adenomatous colonic polyps 08/13/2014  Right knee pain 08/04/2014   Depression 06/02/2012   Chronic back pain 05/30/2012   CAD, NATIVE VESSEL 07/17/2010   HYPERCHOLESTEROLEMIA 10/31/2009   SMOKELESS TOBACCO ABUSE 10/31/2009   Obstructive sleep apnea 10/31/2009   Essential hypertension, benign 10/31/2009   GERD 10/31/2009    PCP: Asencion Noble, MD  REFERRING PROVIDER: Asencion Noble, MD  REFERRING DIAG: PT eval/tx for hip pain due to fall  THERAPY DIAG:  No diagnosis found.  Rationale for Evaluation and Treatment: Rehabilitation  ONSET DATE: 10/31/22  SUBJECTIVE:   SUBJECTIVE STATEMENT: Pt stated he is pain free today.  Admits to not completing HEP at home, stated his girlfriend is out of town so he is not pushed to complete any exercise at home.    Eval subjective:   Patient presents to therapy with complaint of RT hip pain after a fall last month. He went to ER and had imaging which was unremarkable, though imaging impression states "plain radiographs are significantly insensitive for hip and pelvic fracture". His hip is very stiff now. He would like to get it moving again.   PERTINENT HISTORY: Hx of CVA, chronic LBP, paraplegia   PAIN:  Are you having pain? Yes: NPRS scale: 0/10 at rest; standing 8-9/10 Pain location: Rt hip  Pain description: sore, aching  Aggravating factors: standing, WB Relieving factors: Movement, non WB  PRECAUTIONS: Fall  WEIGHT BEARING RESTRICTIONS: No  FALLS:  Has patient fallen in last 6 months? Yes. Number of falls 5  LIVING ENVIRONMENT: Lives with: lives with their partner Lives in: House/apartment Stairs: No Has following equipment at home: Gilford Rile - 2 wheeled and Wheelchair (power)  OCCUPATION: Retired  PLOF: Needs assistance with ADLs, Needs assistance with homemaking, and Needs assistance with transfers  PATIENT GOALS: Get hip moving again   NEXT MD VISIT:   OBJECTIVE:   DIAGNOSTIC FINDINGS: IMPRESSION: No evidence of displaced hip or pelvic fracture. Please note that plain radiographs are significantly insensitive for hip and pelvic fracture, particularly in the setting of osteopenia.  PATIENT SURVEYS:  FOTO Complete next session   COGNITION: Overall cognitive status: Within functional limits for tasks assessed    PALPATION: No notable TTP about RT hip   LOWER EXTREMITY ROM:  Active ROM Right eval Left eval  Hip flexion 85 95  Hip extension 0 0  Hip abduction    Hip adduction    Hip internal rotation    Hip external rotation    Knee flexion    Knee extension    Ankle dorsiflexion    Ankle plantarflexion    Ankle inversion    Ankle eversion     (Blank rows = not tested)  LOWER EXTREMITY MMT:  MMT Right eval Left eval  Hip flexion 4 4  Hip extension    Hip abduction 3- 4-   Hip adduction    Hip internal rotation    Hip external rotation    Knee flexion    Knee extension 3+ 4-  Ankle dorsiflexion 4+ 4+  Ankle plantarflexion    Ankle inversion    Ankle eversion     (Blank rows = not tested)  TODAY'S TREATMENT:  DATE:  12/07/22: Transfer SPT independently WC to mat  Sidelying Clam ER BLE   Clam IR BLE Supine: Bridge with ball squeeze 12x   12/02/22: Reviewed goals FOTO Supine: Glut sets 10 x5" LTR 10x 10" Clam with GTB 10x 5" Adduction with ball 10x5" Bridge with adduction ball 10x 5"  11/25/22 Eval  Supine clam Glute set LTR  Hip adduction iso    PATIENT EDUCATION:  Education details: on eval findings, POC and HEP  Person educated: Patient Education method: Explanation Education comprehension: verbalized understanding  HOME EXERCISE PROGRAM: Access Code: [email or web address] URL: [email or web address] Date: 11/25/2022 Prepared by: Josue Hector  Exercises - Hooklying Gluteal Sets  - 2-3 x daily - 7 x weekly - 1 sets - 10 reps - 5 second hold - Supine Hip Adduction Isometric with Ball  - 2-3 x daily - 7 x weekly - 1 sets - 10 reps - 5 second hold - Hooklying Clamshell with Resistance  - 2-3 x daily - 7 x weekly - 1 sets - 10 reps - 5 second hold - Supine Lower Trunk Rotation  - 2-3 x daily - 7 x weekly - 1 sets - 10 reps - 5 second hold  12/02/22:  Bridge with adduction  ASSESSMENT:  CLINICAL IMPRESSION: Reviewed importance of HEP compliance, pt admits that girlfriend out of town and has not completed since last session.  Session focus with gluteal strengthening, began gravity resistant that increased difficulty Rt LE with significant weakness.  Pt educated that weakness equals pain and needs to be more compliant with current HEP for maximal benefits, given printout of home exercise program.  Cueing  through session for proper form and to stay on task with exercise, pt tendency to talk through session.  EOS pt reports increased ease positioning into chair.   OBJECTIVE IMPAIRMENTS: decreased activity tolerance, decreased mobility, difficulty walking, decreased ROM, decreased strength, hypomobility, increased fascial restrictions, impaired flexibility, improper body mechanics, and pain.   ACTIVITY LIMITATIONS: standing, transfers, bed mobility, and locomotion level  PARTICIPATION LIMITATIONS: meal prep, cleaning, laundry, shopping, and community activity  PERSONAL FACTORS: Age, Time since onset of injury/illness/exacerbation, and 1-2 comorbidities: See above  are also affecting patient's functional outcome.   REHAB POTENTIAL: Good  CLINICAL DECISION MAKING: Stable/uncomplicated  EVALUATION COMPLEXITY: Low   GOALS: SHORT TERM GOALS: Target date: 12/16/2022  Patient will be independent with initial HEP and self-management strategies to improve functional outcomes Baseline:  Goal status: IN PROGRESS   LONG TERM GOALS: Target date: 01/06/2023  Patient will be independent with advanced HEP and self-management strategies to improve functional outcomes Baseline:  Goal status: IN PROGRESS  2.  Patient will improve FOTO score to predicted value to indicate improvement in functional outcomes Baseline: 12/02/22: 53% functional Goal status: IN PROGRESS  3.  Patient will have RT hip flexion AROM within 5 degrees of contralateral side to improve functional mobility and ability to perform ADLs. Baseline: See AROM Goal status: IN PROGRESS  4. Patient will have equal to or > 4/5 MMT throughout RLE to improve ability to perform functional mobility, stair ambulation and ADLs.  Baseline: See MMT Goal status: IN PROGRESS   PLAN:  PT FREQUENCY: 2x/week  PT DURATION: 6 weeks  PLANNED INTERVENTIONS: Therapeutic exercises, Therapeutic activity, Neuromuscular re-education, Balance training,  Gait training, Patient/Family education, Joint manipulation, Joint mobilization, Stair training, Aquatic Therapy, Dry Needling, Electrical stimulation, Spinal manipulation, Spinal mobilization, Cryotherapy, Moist heat, scar mobilization, Taping, Traction, Ultrasound, Biofeedback, Ionotophoresis '4mg'$ /ml Dexamethasone, and Manual therapy.  PLAN FOR NEXT SESSION:  Progress glute and quad strength. Progress pain free RT hip AROM as tolerated.  Ihor Austin, LPTA/CLT; Delana Meyer [phone]  2:33 PM, 12/07/22

## 2022-12-09 ENCOUNTER — Encounter (HOSPITAL_COMMUNITY): Payer: Self-pay

## 2022-12-09 ENCOUNTER — Ambulatory Visit (HOSPITAL_COMMUNITY): Payer: Medicare Other

## 2022-12-09 DIAGNOSIS — M6281 Muscle weakness (generalized): Secondary | ICD-10-CM

## 2022-12-09 DIAGNOSIS — R262 Difficulty in walking, not elsewhere classified: Secondary | ICD-10-CM

## 2022-12-09 DIAGNOSIS — M25551 Pain in right hip: Secondary | ICD-10-CM

## 2022-12-09 DIAGNOSIS — R2689 Other abnormalities of gait and mobility: Secondary | ICD-10-CM

## 2022-12-09 DIAGNOSIS — I498 Other specified cardiac arrhythmias: Secondary | ICD-10-CM | POA: Diagnosis not present

## 2022-12-09 DIAGNOSIS — I2510 Atherosclerotic heart disease of native coronary artery without angina pectoris: Secondary | ICD-10-CM | POA: Diagnosis not present

## 2022-12-09 NOTE — Therapy (Addendum)
OUTPATIENT PHYSICAL THERAPY LOWER EXTREMITY TREATMENT   Patient Name: Cody Velez MRN: [id number] DOB:November 09, 1946, 76 y.o., male Today's Date: 12/09/2022  END OF SESSION:     12/09/22 1443  PT Visits / Re-Eval  Visit Number 4  Number of Visits 12  Date for PT Re-Evaluation 01/06/23  Authorization  Authorization Type Medicare A/ BCBS Federal 2ndary  Progress Note Due on Visit 10  PT Time Calculation  PT Start Time 1435  PT Stop Time 1517  PT Time Calculation (min) 42 min  PT - End of Session  Activity Tolerance Patient tolerated treatment well  Behavior During Therapy WFL for tasks assessed/performed     Past Medical History:  Diagnosis Date   Allergic rhinitis    Arthritis    B12 deficiency    Cervicogenic headache    Childhood asthma    Chronic back pain    Coronary atherosclerosis of native coronary artery    Mild nonobstructive CAD at catheterization January 2015   Dementia [facility])    Depression    Essential hypertension    Falls    GERD (gastroesophageal reflux disease)    Glaucoma    History of blood transfusion    History of cerebrovascular disease 07/23/2015   History of kidney stones    History of pneumonia 02/2011   Hyperlipidemia    OSA (obstructive sleep apnea)    CPAP - Dr. [name] - uses cpap every night   Pneumonia    Prostate cancer ([place])    PTSD (post-traumatic stress disorder)    [place]   Rectal bleeding    Stroke ([place]) 03/2021   Wheelchair bound    Past Surgical History:  Procedure Laterality Date   APPLICATION OF ROBOTIC ASSISTANCE FOR SPINAL PROCEDURE N/A 03/28/2018   Procedure: APPLICATION OF ROBOTIC ASSISTANCE FOR SPINAL PROCEDURE;  Surgeon: Kristeen Miss, MD;  Location: [place];  Service: Neurosurgery;  Laterality: N/A;   BACK SURGERY  02/14/2012   lumbar OR #7; "today redid L1L2; replaced screws; added bone from hip"   BILATERAL KNEE ARTHROSCOPY     COLONOSCOPY  10/15/2008   Dr. [name]: tubular adenoma    COLONOSCOPY   12/17/2003   ZDG:UYQIHK rectal and colon   COLONOSCOPY N/A 09/05/2014   tubular adenoma   COLONOSCOPY WITH PROPOFOL N/A 09/15/2022   Multiple 3-25 mm polyps in ascending colon and cecum, markedly elongated and redundant colon. One polyp removed piecemeal fashion. 6 month surveillance recommended. Tubular adenomas.   CYSTOSCOPY WITH STENT PLACEMENT Right 01/27/2016   Procedure: CYSTOSCOPY WITH STENT PLACEMENT;  Surgeon: [name], MD;  [facility];  Service: Urology;  Laterality: Right;   CYSTOSCOPY/RETROGRADE/URETEROSCOPY/STONE EXTRACTION WITH BASKET Right 01/27/2016   Procedure: CYSTOSCOPY, RIGHT RETROGRADE, RIGHT URETEROSCOPY, STONE EXTRACTION ;  Surgeon: [name], MD;  Location: [facility];  Service: Urology;  Laterality: Right;   ESOPHAGOGASTRODUODENOSCOPY  10/15/2008   Dr Rourk:Schatzki's ring status post dilation and disruption via [address] dilator/ otherwise unremarkable esophagus, small hiatal hernia, multiple fundal gland polyps not manipulated, gastritis, negative H.pylori   ESOPHAGOGASTRODUODENOSCOPY  06/21/2002   VQQ:VZDGL sliding hiatal hernia with mild changes of reflux esophagitis limited to gastroesophageal junction.  Noncritical ring at distal esophagus, 3 cm proximal to gastroesophageal junction/Antral gastritis   FACIAL COSMETIC SURGERY  ` 1985   "broke face playing softball"   FLEXIBLE SIGMOIDOSCOPY N/A 07/28/2022   Procedure: FLEXIBLE SIGMOIDOSCOPY;  Surgeon: Daneil Dolin, MD;  [facility];  Service: Endoscopy;  Laterality: N/A;   FRACTURE SURGERY     "  left wrist; broke it; took spur off"   HOLMIUM LASER APPLICATION Right 42/59/5638   Procedure: HOLMIUM LASER APPLICATION;  Surgeon: [name], MD;  Location: [facility];  Service: Urology;  Laterality: Right;   KNEE ARTHROSCOPY Right 05/18/2018   Procedure: PARTIAL MEDIAL MENISECTOMY AND SURGICAL LAVAGE AND CHONDROPLASTY;  Surgeon: [name], MD;  Location: [place];  Service:  Orthopedics;  Laterality: Right;   LEFT HEART CATHETERIZATION WITH CORONARY ANGIOGRAM N/A 12/27/2013   Procedure: LEFT HEART CATHETERIZATION WITH CORONARY ANGIOGRAM;  Surgeon: [name], MD;  Location: [facility] CATH LAB;  Service: Cardiovascular;  Laterality: N/A;   neck epidural     POLYPECTOMY  09/15/2022   Procedure: POLYPECTOMY;  Surgeon: Daneil Dolin, MD;  [facility];  Service: Endoscopy;;   POSTERIOR LUMBAR FUSION 4 LEVEL N/A 03/28/2018   Procedure: Thoracic eight -Lumbar two- FIXATION WITH SCREW PLACEMENT, DECOMPRESSION Thoracic ten-Thoracic eleven  FOR OSTEOMYELITIS;  Surgeon: Kristeen Miss, MD;  Location: [place];  Service: Neurosurgery;  Laterality: N/A;   SCLEROTHERAPY  09/15/2022   Procedure: SCLEROTHERAPY;  Surgeon: Daneil Dolin, MD;  [facility];  Service: Endoscopy;;   SHOULDER SURGERY Bilateral    SPINAL CORD STIMULATOR REMOVAL N/A 02/25/2020   Procedure: LUMBAR SPINAL CORD STIMULATOR REMOVAL;  Surgeon: Kristeen Miss, MD;  Location: [place];  Service: Neurosurgery;  Laterality: N/A;   TEE WITHOUT CARDIOVERSION N/A 03/21/2018   Procedure: TRANSESOPHAGEAL ECHOCARDIOGRAM (TEE) WITH PROPOFOL;  Surgeon: [name], MD;  [facility];  Service: Cardiovascular;  Laterality: N/A;   Patient Active Problem List   Diagnosis Date Noted   History of diarrhea 05/12/2022   History of colonic polyps 05/12/2022   Bilateral carpal tunnel syndrome 01/08/2022   Carcinoma of prostate ([place]) 01/08/2022   Cough 01/08/2022   Fasciculation 75/64/3329   Follicular cyst of the skin and subcutaneous tissue, unspecified 01/08/2022   Hypertrophy of nasal turbinates 01/08/2022   PTSD (post-traumatic stress disorder) 06/17/2021   Stroke ([place]) 04/29/2021   Infection of spinal cord stimulator ([place]) 02/26/2020   Pseudogout of knee    Pyogenic arthritis of right knee joint ([place])    Falls frequently 05/08/2018   Fall    Sleep disturbance    Post-op pain     Hypokalemia    Morbid obesity ([place])    Postoperative pain    DVT, lower extremity, distal, acute, bilateral ([facility])    Acute blood loss anemia    Anemia of chronic disease    Essential hypertension    Bacteremia    Myelopathy ([place]) 03/30/2018   Paraplegia ([facility])    Postlaminectomy syndrome, lumbar region    Epidural abscess    Abdominal distension    Encephalopathy    Weakness of both lower extremities    Discitis of thoracic region    Spinal cord stimulator status    Staphylococcus aureus sepsis ([place]) 03/20/2018   Bacteremia due to methicillin susceptible Staphylococcus aureus (MSSA) 03/18/2018   Obesity 03/18/2018   Nephrolithiasis 03/16/2018   AKI (acute kidney injury) ([place]) 03/16/2018   Cognitive impairment 03/16/2018   Chronic pain syndrome 03/16/2018   BPH (benign prostatic hyperplasia) 01/17/2018   Insomnia 01/17/2018   Discogenic cervical pain 10/13/2017   Cervico-occipital neuralgia 06/09/2016   Cervicogenic headache 01/28/2016   B12 deficiency 01/28/2016   Cervical radiculopathy 08/14/2015   Memory difficulty 07/23/2015   History of cerebrovascular disease 07/23/2015   FH: colon cancer 08/13/2014   Hx of adenomatous colonic polyps 08/13/2014  Right knee pain 08/04/2014   Depression 06/02/2012   Chronic back pain 05/30/2012   CAD, NATIVE VESSEL 07/17/2010   HYPERCHOLESTEROLEMIA 10/31/2009   SMOKELESS TOBACCO ABUSE 10/31/2009   Obstructive sleep apnea 10/31/2009   Essential hypertension, benign 10/31/2009   GERD 10/31/2009    PCP: Asencion Noble, MD  REFERRING PROVIDER: Asencion Noble, MD  REFERRING DIAG: PT eval/tx for hip pain due to fall  THERAPY DIAG:  No diagnosis found.  Rationale for Evaluation and Treatment: Rehabilitation  ONSET DATE: 10/31/22  SUBJECTIVE:   SUBJECTIVE STATEMENT: Pt stated he is feeling good today, no reports of pain at entrance.  Reports he has increased his compliance with HEP.    Eval subjective:  Patient presents to therapy  with complaint of RT hip pain after a fall last month. He went to ER and had imaging which was unremarkable, though imaging impression states "plain radiographs are significantly insensitive for hip and pelvic fracture". His hip is very stiff now. He would like to get it moving again.   PERTINENT HISTORY: Hx of CVA, chronic LBP, paraplegia   PAIN:  Are you having pain? Yes: NPRS scale: 0/10 at rest; standing 8-9/10 Pain location: Rt hip  Pain description: sore, aching  Aggravating factors: standing, WB Relieving factors: Movement, non WB  PRECAUTIONS: Fall  WEIGHT BEARING RESTRICTIONS: No  FALLS:  Has patient fallen in last 6 months? Yes. Number of falls 5  LIVING ENVIRONMENT: Lives with: lives with their partner Lives in: House/apartment Stairs: No Has following equipment at home: Gilford Rile - 2 wheeled and Wheelchair (power)  OCCUPATION: Retired  PLOF: Needs assistance with ADLs, Needs assistance with homemaking, and Needs assistance with transfers  PATIENT GOALS: Get hip moving again   NEXT MD VISIT:   OBJECTIVE:   DIAGNOSTIC FINDINGS: IMPRESSION: No evidence of displaced hip or pelvic fracture. Please note that plain radiographs are significantly insensitive for hip and pelvic fracture, particularly in the setting of osteopenia.  PATIENT SURVEYS:  FOTO Complete next session   COGNITION: Overall cognitive status: Within functional limits for tasks assessed    PALPATION: No notable TTP about RT hip   LOWER EXTREMITY ROM:  Active ROM Right eval Left eval  Hip flexion 85 95  Hip extension 0 0  Hip abduction    Hip adduction    Hip internal rotation    Hip external rotation    Knee flexion    Knee extension    Ankle dorsiflexion    Ankle plantarflexion    Ankle inversion    Ankle eversion     (Blank rows = not tested)  LOWER EXTREMITY MMT:  MMT Right eval Left eval  Hip flexion 4 4  Hip extension    Hip abduction 3- 4-  Hip adduction    Hip  internal rotation    Hip external rotation    Knee flexion    Knee extension 3+ 4-  Ankle dorsiflexion 4+ 4+  Ankle plantarflexion    Ankle inversion    Ankle eversion     (Blank rows = not tested)  TODAY'S TREATMENT:  DATE:  12/09/22: Supine:  required 3 pillows this session Bridge with ball squeeze 15x cueing to reduce ER LTR 5x 20" SKTC 2x 20" with towel assistance Quad sets with verbal and tactile cueing 10x Windshield wiper Rt only 10x Seated: LAQ 5x each   12/07/22: Transfer SPT independently WC to mat  Sidelying Clam ER BLE   Clam IR BLE Supine: Bridge with ball squeeze 12x   12/02/22: Reviewed goals FOTO Supine: Glut sets 10 x5" LTR 10x 10" Clam with GTB 10x 5" Adduction with ball 10x5" Bridge with adduction ball 10x 5"  11/25/22 Eval  Supine clam Glute set LTR  Hip adduction iso    PATIENT EDUCATION:  Education details: on eval findings, POC and HEP  Person educated: Patient Education method: Explanation Education comprehension: verbalized understanding  HOME EXERCISE PROGRAM: Access Code: [email or web address] URL: [email or web address] Date: 11/25/2022 Prepared by: Josue Hector  Exercises - Hooklying Gluteal Sets  - 2-3 x daily - 7 x weekly - 1 sets - 10 reps - 5 second hold - Supine Hip Adduction Isometric with Ball  - 2-3 x daily - 7 x weekly - 1 sets - 10 reps - 5 second hold - Hooklying Clamshell with Resistance  - 2-3 x daily - 7 x weekly - 1 sets - 10 reps - 5 second hold - Supine Lower Trunk Rotation  - 2-3 x daily - 7 x weekly - 1 sets - 10 reps - 5 second hold  12/02/22:  Bridge with adduction  ASSESSMENT:  CLINICAL IMPRESSION: Continued session focus with glut and quad strengthening as well as hip mobility for pain control.  Pt required cueing through session to reduce ER and verbal/tactile cueing for  proper musculature contraction for maximal benefits.  Pt with tendency to hold breath through session, cueing to exhale with exertion.  No reports of increased pain through session.   OBJECTIVE IMPAIRMENTS: decreased activity tolerance, decreased mobility, difficulty walking, decreased ROM, decreased strength, hypomobility, increased fascial restrictions, impaired flexibility, improper body mechanics, and pain.   ACTIVITY LIMITATIONS: standing, transfers, bed mobility, and locomotion level  PARTICIPATION LIMITATIONS: meal prep, cleaning, laundry, shopping, and community activity  PERSONAL FACTORS: Age, Time since onset of injury/illness/exacerbation, and 1-2 comorbidities: See above  are also affecting patient's functional outcome.   REHAB POTENTIAL: Good  CLINICAL DECISION MAKING: Stable/uncomplicated  EVALUATION COMPLEXITY: Low   GOALS: SHORT TERM GOALS: Target date: 12/16/2022  Patient will be independent with initial HEP and self-management strategies to improve functional outcomes Baseline:  Goal status: IN PROGRESS   LONG TERM GOALS: Target date: 01/06/2023  Patient will be independent with advanced HEP and self-management strategies to improve functional outcomes Baseline:  Goal status: IN PROGRESS  2.  Patient will improve FOTO score to predicted value to indicate improvement in functional outcomes Baseline: 12/02/22: 53% functional Goal status: IN PROGRESS  3.  Patient will have RT hip flexion AROM within 5 degrees of contralateral side to improve functional mobility and ability to perform ADLs. Baseline: See AROM Goal status: IN PROGRESS  4. Patient will have equal to or > 4/5 MMT throughout RLE to improve ability to perform functional mobility, stair ambulation and ADLs.  Baseline: See MMT Goal status: IN PROGRESS   PLAN:  PT FREQUENCY: 2x/week  PT DURATION: 6 weeks  PLANNED INTERVENTIONS: Therapeutic exercises, Therapeutic activity, Neuromuscular  re-education, Balance training, Gait training, Patient/Family education, Joint manipulation, Joint mobilization, Stair training, Aquatic Therapy, Dry Needling, Electrical stimulation, Spinal manipulation, Spinal mobilization, Cryotherapy, Moist heat, scar mobilization,  Taping, Traction, Ultrasound, Biofeedback, Ionotophoresis '4mg'$ /ml Dexamethasone, and Manual therapy.   PLAN FOR NEXT SESSION:  Progress glute and quad strength. Progress pain free RT hip AROM as tolerated.  Ihor Austin, LPTA/CLT; Delana Meyer [phone]  2:36 PM, 12/09/22

## 2022-12-15 ENCOUNTER — Ambulatory Visit (HOSPITAL_COMMUNITY): Payer: Medicare Other

## 2022-12-15 ENCOUNTER — Encounter (HOSPITAL_COMMUNITY): Payer: Self-pay

## 2022-12-15 DIAGNOSIS — M6281 Muscle weakness (generalized): Secondary | ICD-10-CM | POA: Diagnosis not present

## 2022-12-15 DIAGNOSIS — M25551 Pain in right hip: Secondary | ICD-10-CM | POA: Diagnosis not present

## 2022-12-15 DIAGNOSIS — R2689 Other abnormalities of gait and mobility: Secondary | ICD-10-CM | POA: Diagnosis not present

## 2022-12-15 DIAGNOSIS — I2510 Atherosclerotic heart disease of native coronary artery without angina pectoris: Secondary | ICD-10-CM | POA: Diagnosis not present

## 2022-12-15 DIAGNOSIS — I498 Other specified cardiac arrhythmias: Secondary | ICD-10-CM | POA: Diagnosis not present

## 2022-12-15 DIAGNOSIS — R262 Difficulty in walking, not elsewhere classified: Secondary | ICD-10-CM | POA: Diagnosis not present

## 2022-12-15 NOTE — Therapy (Signed)
OUTPATIENT PHYSICAL THERAPY LOWER EXTREMITY TREATMENT   Patient Name: Cody Velez MRN: [id number] DOB:1946-07-28, 76 y.o., male Today's Date: 12/15/2022  END OF SESSION:   PT End of Session - 12/15/22 1605     Visit Number 5    Number of Visits 12    Date for PT Re-Evaluation 01/06/23    Authorization Type Medicare A/ BCBS Federal 2ndary    Progress Note Due on Visit 10    PT Start Time 1519    PT Stop Time 1600    PT Time Calculation (min) 41 min    Activity Tolerance Patient tolerated treatment well    Behavior During Therapy WFL for tasks assessed/performed              Past Medical History:  Diagnosis Date   Allergic rhinitis    Arthritis    B12 deficiency    Cervicogenic headache    Childhood asthma    Chronic back pain    Coronary atherosclerosis of native coronary artery    Mild nonobstructive CAD at catheterization January 2015   Dementia [facility])    Depression    Essential hypertension    Falls    GERD (gastroesophageal reflux disease)    Glaucoma    History of blood transfusion    History of cerebrovascular disease 07/23/2015   History of kidney stones    History of pneumonia 02/2011   Hyperlipidemia    OSA (obstructive sleep apnea)    CPAP - Dr. [name] - uses cpap every night   Pneumonia    Prostate cancer ([place])    PTSD (post-traumatic stress disorder)    [place]   Rectal bleeding    Stroke ([place]) 03/2021   Wheelchair bound    Past Surgical History:  Procedure Laterality Date   APPLICATION OF ROBOTIC ASSISTANCE FOR SPINAL PROCEDURE N/A 03/28/2018   Procedure: APPLICATION OF ROBOTIC ASSISTANCE FOR SPINAL PROCEDURE;  Surgeon: Kristeen Miss, MD;  Location: [place];  Service: Neurosurgery;  Laterality: N/A;   BACK SURGERY  02/14/2012   lumbar OR #7; "today redid L1L2; replaced screws; added bone from hip"   BILATERAL KNEE ARTHROSCOPY     COLONOSCOPY  10/15/2008   Dr. [name]: tubular adenoma    COLONOSCOPY  12/17/2003   LZJ:QBHALP rectal and  colon   COLONOSCOPY N/A 09/05/2014   tubular adenoma   COLONOSCOPY WITH PROPOFOL N/A 09/15/2022   Multiple 3-25 mm polyps in ascending colon and cecum, markedly elongated and redundant colon. One polyp removed piecemeal fashion. 6 month surveillance recommended. Tubular adenomas.   CYSTOSCOPY WITH STENT PLACEMENT Right 01/27/2016   Procedure: CYSTOSCOPY WITH STENT PLACEMENT;  Surgeon: [name], MD;  Location: [facility];  Service: Urology;  Laterality: Right;   CYSTOSCOPY/RETROGRADE/URETEROSCOPY/STONE EXTRACTION WITH BASKET Right 01/27/2016   Procedure: CYSTOSCOPY, RIGHT RETROGRADE, RIGHT URETEROSCOPY, STONE EXTRACTION ;  Surgeon: [name], MD;  Location: [facility];  Service: Urology;  Laterality: Right;   ESOPHAGOGASTRODUODENOSCOPY  10/15/2008   Dr Rourk:Schatzki's ring status post dilation and disruption via [address] dilator/ otherwise unremarkable esophagus, small hiatal hernia, multiple fundal gland polyps not manipulated, gastritis, negative H.pylori   ESOPHAGOGASTRODUODENOSCOPY  06/21/2002   FXT:KWIOX sliding hiatal hernia with mild changes of reflux esophagitis limited to gastroesophageal junction.  Noncritical ring at distal esophagus, 3 cm proximal to gastroesophageal junction/Antral gastritis   FACIAL COSMETIC SURGERY  ` 1985   "broke face playing softball"   [place] N/A 07/28/2022   Procedure: FLEXIBLE SIGMOIDOSCOPY;  Surgeon: Daneil Dolin,  MD;  [facility];  Service: Endoscopy;  Laterality: N/A;   FRACTURE SURGERY     "left wrist; broke it; took spur off"   HOLMIUM LASER APPLICATION Right 67/67/2094   Procedure: HOLMIUM LASER APPLICATION;  Surgeon: [name], MD;  Location: [facility];  Service: Urology;  Laterality: Right;   KNEE ARTHROSCOPY Right 05/18/2018   Procedure: PARTIAL MEDIAL MENISECTOMY AND SURGICAL LAVAGE AND CHONDROPLASTY;  Surgeon: [name], MD;  Location: [facility];  Service: Orthopedics;  Laterality: Right;   LEFT  HEART CATHETERIZATION WITH CORONARY ANGIOGRAM N/A 12/27/2013   Procedure: LEFT HEART CATHETERIZATION WITH CORONARY ANGIOGRAM;  Surgeon: [name], MD;  Location: [facility] CATH LAB;  Service: Cardiovascular;  Laterality: N/A;   neck epidural     POLYPECTOMY  09/15/2022   Procedure: POLYPECTOMY;  Surgeon: Daneil Dolin, MD;  [facility];  Service: Endoscopy;;   POSTERIOR LUMBAR FUSION 4 LEVEL N/A 03/28/2018   Procedure: Thoracic eight -Lumbar two- FIXATION WITH SCREW PLACEMENT, DECOMPRESSION Thoracic ten-Thoracic eleven  FOR OSTEOMYELITIS;  Surgeon: Kristeen Miss, MD;  Location: [facility];  Service: Neurosurgery;  Laterality: N/A;   SCLEROTHERAPY  09/15/2022   Procedure: SCLEROTHERAPY;  Surgeon: Daneil Dolin, MD;  [facility];  Service: Endoscopy;;   SHOULDER SURGERY Bilateral    SPINAL CORD STIMULATOR REMOVAL N/A 02/25/2020   Procedure: LUMBAR SPINAL CORD STIMULATOR REMOVAL;  Surgeon: Kristeen Miss, MD;  Location: [facility];  Service: Neurosurgery;  Laterality: N/A;   TEE WITHOUT CARDIOVERSION N/A 03/21/2018   Procedure: TRANSESOPHAGEAL ECHOCARDIOGRAM (TEE) WITH PROPOFOL;  Surgeon: [name], MD;  [facility];  Service: Cardiovascular;  Laterality: N/A;   Patient Active Problem List   Diagnosis Date Noted   History of diarrhea 05/12/2022   History of colonic polyps 05/12/2022   Bilateral carpal tunnel syndrome 01/08/2022   Carcinoma of prostate ([place]) 01/08/2022   Cough 01/08/2022   Fasciculation 70/96/2836   Follicular cyst of the skin and subcutaneous tissue, unspecified 01/08/2022   Hypertrophy of nasal turbinates 01/08/2022   PTSD (post-traumatic stress disorder) 06/17/2021   Stroke ([place]) 04/29/2021   Infection of spinal cord stimulator ([place]) 02/26/2020   Pseudogout of knee    Pyogenic arthritis of right knee joint ([place])    Falls frequently 05/08/2018   Fall    Sleep disturbance    Post-op pain    Hypokalemia    Morbid obesity ([place])     Postoperative pain    DVT, lower extremity, distal, acute, bilateral ([facility])    Acute blood loss anemia    Anemia of chronic disease    Essential hypertension    Bacteremia    Myelopathy ([place]) 03/30/2018   Paraplegia ([facility])    Postlaminectomy syndrome, lumbar region    Epidural abscess    Abdominal distension    Encephalopathy    Weakness of both lower extremities    Discitis of thoracic region    Spinal cord stimulator status    Staphylococcus aureus sepsis ([place]) 03/20/2018   Bacteremia due to methicillin susceptible Staphylococcus aureus (MSSA) 03/18/2018   Obesity 03/18/2018   Nephrolithiasis 03/16/2018   AKI (acute kidney injury) ([place]) 03/16/2018   Cognitive impairment 03/16/2018   Chronic pain syndrome 03/16/2018   BPH (benign prostatic hyperplasia) 01/17/2018   Insomnia 01/17/2018   Discogenic cervical pain 10/13/2017   Cervico-occipital neuralgia 06/09/2016   Cervicogenic headache 01/28/2016   B12 deficiency 01/28/2016   Cervical radiculopathy 08/14/2015   Memory difficulty 07/23/2015   History  of cerebrovascular disease 07/23/2015   FH: colon cancer 08/13/2014   Hx of adenomatous colonic polyps 08/13/2014   Right knee pain 08/04/2014   Depression 06/02/2012   Chronic back pain 05/30/2012   CAD, NATIVE VESSEL 07/17/2010   HYPERCHOLESTEROLEMIA 10/31/2009   SMOKELESS TOBACCO ABUSE 10/31/2009   Obstructive sleep apnea 10/31/2009   Essential hypertension, benign 10/31/2009   GERD 10/31/2009    PCP: Asencion Noble, MD  REFERRING PROVIDER: Asencion Noble, MD  REFERRING DIAG: PT eval/tx for hip pain due to fall  THERAPY DIAG:  Pain in right hip  Other abnormalities of gait and mobility  Muscle weakness (generalized)  Rationale for Evaluation and Treatment: Rehabilitation  ONSET DATE: 10/31/22  SUBJECTIVE:   SUBJECTIVE STATEMENT: Pt stated his Rt hip has reduced in pain, ability to stand with increased weight bearing on Rt LE.  Does have intermittent LBP depending  upon the movement.  Eval subjective:  Patient presents to therapy with complaint of RT hip pain after a fall last month. He went to ER and had imaging which was unremarkable, though imaging impression states "plain radiographs are significantly insensitive for hip and pelvic fracture". His hip is very stiff now. He would like to get it moving again.   PERTINENT HISTORY: Hx of CVA, chronic LBP, paraplegia   PAIN:  Are you having pain? Yes: NPRS scale: 0/10 at rest; standing 8-9/10 Pain location: Rt hip  Pain description: sore, aching  Aggravating factors: standing, WB Relieving factors: Movement, non WB  PRECAUTIONS: Fall  WEIGHT BEARING RESTRICTIONS: No  FALLS:  Has patient fallen in last 6 months? Yes. Number of falls 5  LIVING ENVIRONMENT: Lives with: lives with their partner Lives in: House/apartment Stairs: No Has following equipment at home: Gilford Rile - 2 wheeled and Wheelchair (power)  OCCUPATION: Retired  PLOF: Needs assistance with ADLs, Needs assistance with homemaking, and Needs assistance with transfers  PATIENT GOALS: Get hip moving again   NEXT MD VISIT:   OBJECTIVE:   DIAGNOSTIC FINDINGS: IMPRESSION: No evidence of displaced hip or pelvic fracture. Please note that plain radiographs are significantly insensitive for hip and pelvic fracture, particularly in the setting of osteopenia.  PATIENT SURVEYS:  FOTO 12/02/22: 53% functional  COGNITION: Overall cognitive status: Within functional limits for tasks assessed    PALPATION: No notable TTP about RT hip   LOWER EXTREMITY ROM:  Active ROM Right eval Left eval  Hip flexion 85 95  Hip extension 0 0  Hip abduction    Hip adduction    Hip internal rotation    Hip external rotation    Knee flexion    Knee extension    Ankle dorsiflexion    Ankle plantarflexion    Ankle inversion    Ankle eversion     (Blank rows = not tested)  LOWER EXTREMITY MMT:  MMT Right eval Left eval  Hip flexion 4  4  Hip extension    Hip abduction 3- 4-  Hip adduction    Hip internal rotation    Hip external rotation    Knee flexion    Knee extension 3+ 4-  Ankle dorsiflexion 4+ 4+  Ankle plantarflexion    Ankle inversion    Ankle eversion     (Blank rows = not tested)  TODAY'S TREATMENT:  DATE:  12/15/22 Sit to stand elevated height 5x Ability to stand with UE support RW 3x 1' Weight shifting TKE 5x 5" with GTB standing with RW Marching with RW 10x 5" Seated: LAQ 10x each  Marching 10x each  12/09/22: Supine:  required 3 pillows this session Bridge with ball squeeze 15x cueing to reduce ER LTR 5x 20" SKTC 2x 20" with towel assistance Quad sets with verbal and tactile cueing 10x Windshield wiper Rt only 10x Seated: LAQ 5x each   12/07/22: Transfer SPT independently WC to mat  Sidelying Clam ER BLE   Clam IR BLE Supine: Bridge with ball squeeze 12x   12/02/22: Reviewed goals FOTO Supine: Glut sets 10 x5" LTR 10x 10" Clam with GTB 10x 5" Adduction with ball 10x5" Bridge with adduction ball 10x 5"  11/25/22 Eval  Supine clam Glute set LTR  Hip adduction iso    PATIENT EDUCATION:  Education details: on eval findings, POC and HEP  Person educated: Patient Education method: Explanation Education comprehension: verbalized understanding  HOME EXERCISE PROGRAM: Access Code: [email or web address] URL: [email or web address] Date: 11/25/2022 Prepared by: Josue Hector  Exercises - Hooklying Gluteal Sets  - 2-3 x daily - 7 x weekly - 1 sets - 10 reps - 5 second hold - Supine Hip Adduction Isometric with Ball  - 2-3 x daily - 7 x weekly - 1 sets - 10 reps - 5 second hold - Hooklying Clamshell with Resistance  - 2-3 x daily - 7 x weekly - 1 sets - 10 reps - 5 second hold - Supine Lower Trunk Rotation  - 2-3 x daily - 7 x weekly - 1 sets - 10  reps - 5 second hold  12/02/22:  Bridge with adduction  ASSESSMENT:  CLINICAL IMPRESSION: Progressed to standing exercises for gluteal and quad strengthening.  Pt presents with improved tolerance Rt LE weight bearing, mainly limited by fatigue requiring seated rest breaks.  Cueing for hand placement for sit to stand/stand to sit for safety.  Educated importance of knee extension with standing/transfers, added standing TKE to POC.  No reports of pain through session.   OBJECTIVE IMPAIRMENTS: decreased activity tolerance, decreased mobility, difficulty walking, decreased ROM, decreased strength, hypomobility, increased fascial restrictions, impaired flexibility, improper body mechanics, and pain.   ACTIVITY LIMITATIONS: standing, transfers, bed mobility, and locomotion level  PARTICIPATION LIMITATIONS: meal prep, cleaning, laundry, shopping, and community activity  PERSONAL FACTORS: Age, Time since onset of injury/illness/exacerbation, and 1-2 comorbidities: See above  are also affecting patient's functional outcome.   REHAB POTENTIAL: Good  CLINICAL DECISION MAKING: Stable/uncomplicated  EVALUATION COMPLEXITY: Low   GOALS: SHORT TERM GOALS: Target date: 12/16/2022  Patient will be independent with initial HEP and self-management strategies to improve functional outcomes Baseline:  Goal status: IN PROGRESS   LONG TERM GOALS: Target date: 01/06/2023  Patient will be independent with advanced HEP and self-management strategies to improve functional outcomes Baseline:  Goal status: IN PROGRESS  2.  Patient will improve FOTO score to predicted value to indicate improvement in functional outcomes Baseline: 12/02/22: 53% functional Goal status: IN PROGRESS  3.  Patient will have RT hip flexion AROM within 5 degrees of contralateral side to improve functional mobility and ability to perform ADLs. Baseline: See AROM Goal status: IN PROGRESS  4. Patient will have equal to or > 4/5  MMT throughout RLE to improve ability to perform functional mobility, stair ambulation and ADLs.  Baseline: See MMT Goal status: IN PROGRESS   PLAN:  PT  FREQUENCY: 2x/week  PT DURATION: 6 weeks  PLANNED INTERVENTIONS: Therapeutic exercises, Therapeutic activity, Neuromuscular re-education, Balance training, Gait training, Patient/Family education, Joint manipulation, Joint mobilization, Stair training, Aquatic Therapy, Dry Needling, Electrical stimulation, Spinal manipulation, Spinal mobilization, Cryotherapy, Moist heat, scar mobilization, Taping, Traction, Ultrasound, Biofeedback, Ionotophoresis '4mg'$ /ml Dexamethasone, and Manual therapy.   PLAN FOR NEXT SESSION:  Progress glute and quad strength. Progress pain free RT hip AROM as tolerated.  Add minisquats.  Ihor Austin, LPTA/CLT; CBIS [phone]  5:09 PM, 12/15/22

## 2022-12-17 ENCOUNTER — Ambulatory Visit (HOSPITAL_COMMUNITY): Payer: Medicare Other | Admitting: Physical Therapy

## 2022-12-17 DIAGNOSIS — I498 Other specified cardiac arrhythmias: Secondary | ICD-10-CM | POA: Diagnosis not present

## 2022-12-17 DIAGNOSIS — M25551 Pain in right hip: Secondary | ICD-10-CM

## 2022-12-17 DIAGNOSIS — I2510 Atherosclerotic heart disease of native coronary artery without angina pectoris: Secondary | ICD-10-CM | POA: Diagnosis not present

## 2022-12-17 DIAGNOSIS — M6281 Muscle weakness (generalized): Secondary | ICD-10-CM

## 2022-12-17 DIAGNOSIS — R2689 Other abnormalities of gait and mobility: Secondary | ICD-10-CM | POA: Diagnosis not present

## 2022-12-17 DIAGNOSIS — R262 Difficulty in walking, not elsewhere classified: Secondary | ICD-10-CM | POA: Diagnosis not present

## 2022-12-17 NOTE — Therapy (Signed)
OUTPATIENT PHYSICAL THERAPY LOWER EXTREMITY TREATMENT   Patient Name: Cody Velez MRN: [id number] DOB:12/06/1946, 76 y.o., male Today's Date: 12/17/2022  END OF SESSION:   PT End of Session - 12/17/22 1600    Visit Number 6    Number of Visits 12    Date for PT Re-Evaluation 01/06/23    Authorization Type Medicare A/ BCBS Federal 2ndary    Progress Note Due on Visit 10    PT Start Time 1520    PT Stop Time 1600    PT Time Calculation (min) 40 min    Activity Tolerance Patient tolerated treatment well    Behavior During Therapy WFL for tasks assessed/performed              Past Medical History:  Diagnosis Date   Allergic rhinitis    Arthritis    B12 deficiency    Cervicogenic headache    Childhood asthma    Chronic back pain    Coronary atherosclerosis of native coronary artery    Mild nonobstructive CAD at catheterization January 2015   Dementia [facility])    Depression    Essential hypertension    Falls    GERD (gastroesophageal reflux disease)    Glaucoma    History of blood transfusion    History of cerebrovascular disease 07/23/2015   History of kidney stones    History of pneumonia 02/2011   Hyperlipidemia    OSA (obstructive sleep apnea)    CPAP - Dr. Gwenette Velez - uses cpap every night   Pneumonia    Prostate cancer (Cody Velez)    PTSD (post-traumatic stress disorder)    Cody Velez   Rectal bleeding    Stroke (Cody Velez) 03/2021   Wheelchair bound    Past Surgical History:  Procedure Laterality Date   APPLICATION OF ROBOTIC ASSISTANCE FOR SPINAL PROCEDURE N/A 03/28/2018   Procedure: APPLICATION OF ROBOTIC ASSISTANCE FOR SPINAL PROCEDURE;  Surgeon: Cody Miss, MD;  Location: [place];  Service: Neurosurgery;  Laterality: N/A;   BACK SURGERY  02/14/2012   lumbar OR #7; "today redid L1L2; replaced screws; added bone from hip"   BILATERAL KNEE ARTHROSCOPY     COLONOSCOPY  10/15/2008   Dr. Gala Velez: tubular adenoma    COLONOSCOPY  12/17/2003   QIH:KVQQVZ rectal and  colon   COLONOSCOPY N/A 09/05/2014   tubular adenoma   COLONOSCOPY WITH PROPOFOL N/A 09/15/2022   Multiple 3-25 mm polyps in ascending colon and cecum, markedly elongated and redundant colon. One polyp removed piecemeal fashion. 6 month surveillance recommended. Tubular adenomas.   CYSTOSCOPY WITH STENT PLACEMENT Right 01/27/2016   Procedure: CYSTOSCOPY WITH STENT PLACEMENT;  Surgeon: [name], MD;  Location: [facility];  Service: Urology;  Laterality: Right;   CYSTOSCOPY/RETROGRADE/URETEROSCOPY/STONE EXTRACTION WITH BASKET Right 01/27/2016   Procedure: CYSTOSCOPY, RIGHT RETROGRADE, RIGHT URETEROSCOPY, STONE EXTRACTION ;  Surgeon: [name], MD;  Location: [facility];  Service: Urology;  Laterality: Right;   ESOPHAGOGASTRODUODENOSCOPY  10/15/2008   Dr Rourk:Schatzki's ring status post dilation and disruption via 74 F Maloney dilator/ otherwise unremarkable esophagus, small hiatal hernia, multiple fundal gland polyps not manipulated, gastritis, negative H.pylori   ESOPHAGOGASTRODUODENOSCOPY  06/21/2002   DGL:OVFIE sliding hiatal hernia with mild changes of reflux esophagitis limited to gastroesophageal junction.  Noncritical ring at distal esophagus, 3 cm proximal to gastroesophageal junction/Antral gastritis   FACIAL COSMETIC SURGERY  ` 1985   "broke face playing softball"   FLEXIBLE SIGMOIDOSCOPY N/A 07/28/2022   Procedure: FLEXIBLE SIGMOIDOSCOPY;  Surgeon: Cody Dolin, MD;  [facility];  Service: Endoscopy;  Laterality: N/A;   FRACTURE SURGERY     "left wrist; broke it; took spur off"   HOLMIUM LASER APPLICATION Right 63/84/5364   Procedure: HOLMIUM LASER APPLICATION;  Surgeon: [name], MD;  Location: [facility];  Service: Urology;  Laterality: Right;   KNEE ARTHROSCOPY Right 05/18/2018   Procedure: PARTIAL MEDIAL MENISECTOMY AND SURGICAL LAVAGE AND CHONDROPLASTY;  Surgeon: [name], MD;  Location: [place];  Service: Orthopedics;  Laterality: Right;   LEFT  HEART CATHETERIZATION WITH CORONARY ANGIOGRAM N/A 12/27/2013   Procedure: LEFT HEART CATHETERIZATION WITH CORONARY ANGIOGRAM;  Surgeon: Cody M Martinique, MD;  Location: [facility] CATH LAB;  Service: Cardiovascular;  Laterality: N/A;   neck epidural     POLYPECTOMY  09/15/2022   Procedure: POLYPECTOMY;  Surgeon: Cody Dolin, MD;  [facility];  Service: Endoscopy;;   POSTERIOR LUMBAR FUSION 4 LEVEL N/A 03/28/2018   Procedure: Thoracic eight -Lumbar two- FIXATION WITH SCREW PLACEMENT, DECOMPRESSION Thoracic ten-Thoracic eleven  FOR OSTEOMYELITIS;  Surgeon: Cody Miss, MD;  Location: [place];  Service: Neurosurgery;  Laterality: N/A;   SCLEROTHERAPY  09/15/2022   Procedure: SCLEROTHERAPY;  Surgeon: Cody Dolin, MD;  [facility];  Service: Endoscopy;;   SHOULDER SURGERY Bilateral    SPINAL CORD STIMULATOR REMOVAL N/A 02/25/2020   Procedure: LUMBAR SPINAL CORD STIMULATOR REMOVAL;  Surgeon: Cody Miss, MD;  Location: [place];  Service: Neurosurgery;  Laterality: N/A;   TEE WITHOUT CARDIOVERSION N/A 03/21/2018   Procedure: TRANSESOPHAGEAL ECHOCARDIOGRAM (TEE) WITH PROPOFOL;  Surgeon: Cody Sark, MD;  [facility];  Service: Cardiovascular;  Laterality: N/A;   Patient Active Problem List   Diagnosis Date Noted   History of diarrhea 05/12/2022   History of colonic polyps 05/12/2022   Bilateral carpal tunnel syndrome 01/08/2022   Carcinoma of prostate (Cody Velez) 01/08/2022   Cough 01/08/2022   Fasciculation 68/02/2121   Follicular cyst of the skin and subcutaneous tissue, unspecified 01/08/2022   Hypertrophy of nasal turbinates 01/08/2022   PTSD (post-traumatic stress disorder) 06/17/2021   Stroke ([place]) 04/29/2021   Infection of spinal cord stimulator (Cody Velez) 02/26/2020   Pseudogout of knee    Pyogenic arthritis of right knee joint (Cody Velez)    Falls frequently 05/08/2018   Fall    Sleep disturbance    Post-op pain    Hypokalemia    Morbid obesity (Cody Velez)     Postoperative pain    DVT, lower extremity, distal, acute, bilateral (HCC)    Acute blood loss anemia    Anemia of chronic disease    Essential hypertension    Bacteremia    Myelopathy ([place]) 03/30/2018   Paraplegia (HCC)    Postlaminectomy syndrome, lumbar region    Epidural abscess    Abdominal distension    Encephalopathy    Weakness of both lower extremities    Discitis of thoracic region    Spinal cord stimulator status    Staphylococcus aureus sepsis ([place]) 03/20/2018   Bacteremia due to methicillin susceptible Staphylococcus aureus (MSSA) 03/18/2018   Obesity 03/18/2018   Nephrolithiasis 03/16/2018   AKI (acute kidney injury) ([place]) 03/16/2018   Cognitive impairment 03/16/2018   Chronic pain syndrome 03/16/2018   BPH (benign prostatic hyperplasia) 01/17/2018   Insomnia 01/17/2018   Discogenic cervical pain 10/13/2017   Cervico-occipital neuralgia 06/09/2016   Cervicogenic headache 01/28/2016   B12 deficiency 01/28/2016   Cervical radiculopathy 08/14/2015   Memory difficulty 07/23/2015   History of cerebrovascular  disease 07/23/2015   FH: colon cancer 08/13/2014   Hx of adenomatous colonic polyps 08/13/2014   Right knee pain 08/04/2014   Depression 06/02/2012   Chronic back pain 05/30/2012   CAD, NATIVE VESSEL 07/17/2010   HYPERCHOLESTEROLEMIA 10/31/2009   SMOKELESS TOBACCO ABUSE 10/31/2009   Obstructive sleep apnea 10/31/2009   Essential hypertension, benign 10/31/2009   GERD 10/31/2009    PCP: Asencion Noble, MD  REFERRING PROVIDER: Asencion Noble, MD  REFERRING DIAG: PT eval/tx for hip pain due to fall  THERAPY DIAG:  Pain in right hip  Other abnormalities of gait and mobility  Muscle weakness (generalized)  Difficulty in walking, not elsewhere classified  Rationale for Evaluation and Treatment: Rehabilitation  ONSET DATE: 10/31/22  SUBJECTIVE:   SUBJECTIVE STATEMENT: PT states the pain in his hip is gone he has been in [place]: Hx  of CVA, chronic LBP, paraplegia   PAIN:  Are you having pain? Yes: NPRS scale: 0/10 at rest; standing 8-9/10 Pain location: Rt hip  Pain description: sore, aching  Aggravating factors: standing, WB Relieving factors: Movement, non WB  PRECAUTIONS: Fall  WEIGHT BEARING RESTRICTIONS: No  FALLS:  Has patient fallen in last 6 months? Yes. Number of falls 5  LIVING ENVIRONMENT: Lives with: lives with their partner Lives in: House/apartment Stairs: No Has following equipment at home: Gilford Rile - 2 wheeled and Wheelchair (power)  OCCUPATION: Retired  PLOF: Needs assistance with ADLs, Needs assistance with homemaking, and Needs assistance with transfers  PATIENT GOALS: Get hip moving again   NEXT MD VISIT:   OBJECTIVE:   DIAGNOSTIC FINDINGS: IMPRESSION: No evidence of displaced hip or pelvic fracture. Please note that plain radiographs are significantly insensitive for hip and pelvic fracture, particularly in the setting of osteopenia.  PATIENT SURVEYS:  FOTO 12/02/22: 53% functional  COGNITION: Overall cognitive status: Within functional limits for tasks assessed    PALPATION: No notable TTP about RT hip   LOWER EXTREMITY ROM:  Active ROM Right eval Left eval  Hip flexion 85 95  Hip extension 0 0  Hip abduction    Hip adduction    Hip internal rotation    Hip external rotation    Knee flexion    Knee extension    Ankle dorsiflexion    Ankle plantarflexion    Ankle inversion    Ankle eversion     (Blank rows = not tested)  LOWER EXTREMITY MMT:  MMT Right eval Right 12/29 Left eval Left  12/29  Hip flexion 4 3+ sitting  4 3+ sitting   Hip extension      Hip abduction 3-  4-   Hip adduction      Hip internal rotation      Hip external rotation      Knee flexion      Knee extension 3+  4-   Ankle dorsiflexion 4+  4+   Ankle plantarflexion      Ankle inversion      Ankle eversion       (Blank rows = not tested)  TODAY'S TREATMENT:  DATE:  12/17/22  stand with UE support RW with marching  Weight shifting Side stepping  Sitting; Hip abduction with green theraband x 10  Marching with green theraband x 10  Sit to stand x 10  Thera band for core strength and posture. B shoulder extension, rows scapular retraction 10 x @  12/15/22 Sit to stand elevated height 5x Ability to stand with UE support RW 3x 1' Weight shifting TKE 5x 5" with GTB standing with RW Marching with RW 10x 5" Seated: LAQ 10x each  Marching 10x each  12/09/22: Supine:  required 3 pillows this session Bridge with ball squeeze 15x cueing to reduce ER LTR 5x 20" SKTC 2x 20" with towel assistance Quad sets with verbal and tactile cueing 10x Windshield wiper Rt only 10x Seated: LAQ 5x each   12/07/22: Transfer SPT independently WC to mat  Sidelying Clam ER BLE   Clam IR BLE Supine: Bridge with ball squeeze 12x   12/02/22: Reviewed goals FOTO Supine: Glut sets 10 x5" LTR 10x 10" Clam with GTB 10x 5" Adduction with ball 10x5" Bridge with adduction ball 10x 5"  11/25/22 Eval  Supine clam Glute set LTR  Hip adduction iso    PATIENT EDUCATION:  Education details: on eval findings, POC and HEP  Person educated: Patient Education method: Explanation Education comprehension: verbalized understanding  HOME EXERCISE PROGRAM:             12/17/22 - Seated Hip Abduction with Resistance  - 1 x daily - 7 x weekly - 3 sets - 10 reps - Seated March  - 1 x daily - 7 x weekly - 3 sets - 10 reps Access Code: [email or web address] URL: [email or web address] Date: 11/25/2022 Prepared by: Josue Hector  Exercises - Hooklying Gluteal Sets  - 2-3 x daily - 7 x weekly - 1 sets - 10 reps - 5 second hold - Supine Hip Adduction Isometric with Ball  - 2-3 x daily - 7 x weekly - 1 sets - 10 reps - 5 second hold - Hooklying Clamshell  with Resistance  - 2-3 x daily - 7 x weekly - 1 sets - 10 reps - 5 second hold - Supine Lower Trunk Rotation  - 2-3 x daily - 7 x weekly - 1 sets - 10 reps - 5 second hold  12/02/22:  Bridge with adduction  ASSESSMENT:  CLINICAL IMPRESSION: Progressed HEP for resisted sitting exercises.  Continued to work on standing exercises for gluteal and quad strengthening.  Pt tends to keep all wt on RT LE needing significant cueing to shift wt to the Left.  Pt fatigues quickly  requiring seated rest breaks.  Cueing for hand placement for sit to stand/stand to sit for safety.  Educated importance of knee extension with standing/transfers, added standing TKE to POC.  No reports of pain through session.   OBJECTIVE IMPAIRMENTS: decreased activity tolerance, decreased mobility, difficulty walking, decreased ROM, decreased strength, hypomobility, increased fascial restrictions, impaired flexibility, improper body mechanics, and pain.   ACTIVITY LIMITATIONS: standing, transfers, bed mobility, and locomotion level  PARTICIPATION LIMITATIONS: meal prep, cleaning, laundry, shopping, and community activity  PERSONAL FACTORS: Age, Time since onset of injury/illness/exacerbation, and 1-2 comorbidities: See above  are also affecting patient's functional outcome.   REHAB POTENTIAL: Good  CLINICAL DECISION MAKING: Stable/uncomplicated  EVALUATION COMPLEXITY: Low   GOALS: SHORT TERM GOALS: Target date: 12/16/2022  Patient will be independent with initial HEP and self-management strategies to improve functional outcomes Baseline:  Goal status: IN  PROGRESS   LONG TERM GOALS: Target date: 01/06/2023  Patient will be independent with advanced HEP and self-management strategies to improve functional outcomes Baseline:  Goal status: IN PROGRESS  2.  Patient will improve FOTO score to predicted value to indicate improvement in functional outcomes Baseline: 12/02/22: 53% functional Goal status: IN  PROGRESS  3.  Patient will have RT hip flexion AROM within 5 degrees of contralateral side to improve functional mobility and ability to perform ADLs. Baseline: See AROM Goal status: IN PROGRESS  4. Patient will have equal to or > 4/5 MMT throughout RLE to improve ability to perform functional mobility, stair ambulation and ADLs.  Baseline: See MMT Goal status: IN PROGRESS   PLAN:  PT FREQUENCY: 2x/week  PT DURATION: 6 weeks  PLANNED INTERVENTIONS: Therapeutic exercises, Therapeutic activity, Neuromuscular re-education, Balance training, Gait training, Patient/Family education, Joint manipulation, Joint mobilization, Stair training, Aquatic Therapy, Dry Needling, Electrical stimulation, Spinal manipulation, Spinal mobilization, Cryotherapy, Moist heat, scar mobilization, Taping, Traction, Ultrasound, Biofeedback, Ionotophoresis '4mg'$ /ml Dexamethasone, and Manual therapy.   PLAN FOR NEXT SESSION:  Progress glute and quad strength. Progress pain free RT hip AROM as tolerated.  Add minisquats. Rayetta Humphrey, [place] CLT [phone]

## 2022-12-22 ENCOUNTER — Encounter (HOSPITAL_COMMUNITY): Payer: Medicare Other | Admitting: Physical Therapy

## 2022-12-23 ENCOUNTER — Encounter (HOSPITAL_COMMUNITY): Payer: Medicare Other

## 2022-12-24 ENCOUNTER — Ambulatory Visit (HOSPITAL_COMMUNITY): Payer: Medicare Other | Attending: Cardiology

## 2022-12-24 ENCOUNTER — Encounter (HOSPITAL_COMMUNITY): Payer: Self-pay

## 2022-12-24 DIAGNOSIS — M25551 Pain in right hip: Secondary | ICD-10-CM | POA: Insufficient documentation

## 2022-12-24 DIAGNOSIS — R2689 Other abnormalities of gait and mobility: Secondary | ICD-10-CM | POA: Diagnosis not present

## 2022-12-24 DIAGNOSIS — R262 Difficulty in walking, not elsewhere classified: Secondary | ICD-10-CM | POA: Insufficient documentation

## 2022-12-24 DIAGNOSIS — M6281 Muscle weakness (generalized): Secondary | ICD-10-CM | POA: Insufficient documentation

## 2022-12-24 NOTE — Therapy (Signed)
OUTPATIENT PHYSICAL THERAPY LOWER EXTREMITY TREATMENT   Patient Name: Cody Velez MRN: [id number] DOB:01-14-1946, 77 y.o., male Today's Date: 12/24/2022  END OF SESSION: END OF SESSION:   PT End of Session - 12/24/22 1518     Visit Number 7    Number of Visits 12    Date for PT Re-Evaluation 01/06/23    Authorization Type Medicare A/ BCBS Federal 2ndary    Progress Note Due on Visit 10    PT Start Time 1435    PT Stop Time 1515    PT Time Calculation (min) 40 min    Equipment Utilized During Treatment Gait belt    Activity Tolerance Patient tolerated treatment well    Behavior During Therapy WFL for tasks assessed/performed               Past Medical History:  Diagnosis Date   Allergic rhinitis    Arthritis    B12 deficiency    Cervicogenic headache    Childhood asthma    Chronic back pain    Coronary atherosclerosis of native coronary artery    Mild nonobstructive CAD at catheterization January 2015   Dementia [facility])    Depression    Essential hypertension    Falls    GERD (gastroesophageal reflux disease)    Glaucoma    History of blood transfusion    History of cerebrovascular disease 07/23/2015   History of kidney stones    History of pneumonia 02/2011   Hyperlipidemia    OSA (obstructive sleep apnea)    CPAP - Dr. [name] - uses cpap every night   Pneumonia    Prostate cancer ([place])    PTSD (post-traumatic stress disorder)    [place]   Rectal bleeding    Stroke ([place]) 03/2021   Wheelchair bound    Past Surgical History:  Procedure Laterality Date   APPLICATION OF ROBOTIC ASSISTANCE FOR SPINAL PROCEDURE N/A 03/28/2018   Procedure: APPLICATION OF ROBOTIC ASSISTANCE FOR SPINAL PROCEDURE;  Surgeon: Kristeen Miss, MD;  Location: [place];  Service: Neurosurgery;  Laterality: N/A;   BACK SURGERY  02/14/2012   lumbar OR #7; "today redid L1L2; replaced screws; added bone from hip"   BILATERAL KNEE ARTHROSCOPY     COLONOSCOPY  10/15/2008   Dr. [name]:  tubular adenoma    COLONOSCOPY  12/17/2003   [facility]:QQIWLN rectal and colon   COLONOSCOPY N/A 09/05/2014   tubular adenoma   COLONOSCOPY WITH PROPOFOL N/A 09/15/2022   Multiple 3-25 mm polyps in ascending colon and cecum, markedly elongated and redundant colon. One polyp removed piecemeal fashion. 6 month surveillance recommended. Tubular adenomas.   CYSTOSCOPY WITH STENT PLACEMENT Right 01/27/2016   Procedure: CYSTOSCOPY WITH STENT PLACEMENT;  Surgeon: [name], MD;  Location: [facility];  Service: Urology;  Laterality: Right;   CYSTOSCOPY/RETROGRADE/URETEROSCOPY/STONE EXTRACTION WITH BASKET Right 01/27/2016   Procedure: CYSTOSCOPY, RIGHT RETROGRADE, RIGHT URETEROSCOPY, STONE EXTRACTION ;  Surgeon: [name], MD;  Location: [facility];  Service: Urology;  Laterality: Right;   ESOPHAGOGASTRODUODENOSCOPY  10/15/2008   Dr Rourk:Schatzki's ring status post dilation and disruption via 9 F Maloney dilator/ otherwise unremarkable esophagus, small hiatal hernia, multiple fundal gland polyps not manipulated, gastritis, negative H.pylori   ESOPHAGOGASTRODUODENOSCOPY  06/21/2002   LGX:QJJHE sliding hiatal hernia with mild changes of reflux esophagitis limited to gastroesophageal junction.  Noncritical ring at distal esophagus, 3 cm proximal to gastroesophageal junction/Antral gastritis   FACIAL COSMETIC SURGERY  ` 1985   "broke face playing softball"   FLEXIBLE  SIGMOIDOSCOPY N/A 07/28/2022   Procedure: FLEXIBLE SIGMOIDOSCOPY;  Surgeon: Daneil Dolin, MD;  [facility];  Service: Endoscopy;  Laterality: N/A;   FRACTURE SURGERY     "left wrist; broke it; took spur off"   HOLMIUM LASER APPLICATION Right 09/98/3382   Procedure: HOLMIUM LASER APPLICATION;  Surgeon: [name], MD;  Location: [facility];  Service: Urology;  Laterality: Right;   KNEE ARTHROSCOPY Right 05/18/2018   Procedure: PARTIAL MEDIAL MENISECTOMY AND SURGICAL LAVAGE AND CHONDROPLASTY;  Surgeon: [name], MD;   Location: [place];  Service: Orthopedics;  Laterality: Right;   LEFT HEART CATHETERIZATION WITH CORONARY ANGIOGRAM N/A 12/27/2013   Procedure: LEFT HEART CATHETERIZATION WITH CORONARY ANGIOGRAM;  Surgeon: [name], MD;  Location: [facility];  Service: Cardiovascular;  Laterality: N/A;   neck epidural     POLYPECTOMY  09/15/2022   Procedure: POLYPECTOMY;  Surgeon: Daneil Dolin, MD;  [facility];  Service: Endoscopy;;   POSTERIOR LUMBAR FUSION 4 LEVEL N/A 03/28/2018   Procedure: Thoracic eight -Lumbar two- FIXATION WITH SCREW PLACEMENT, DECOMPRESSION Thoracic ten-Thoracic eleven  FOR OSTEOMYELITIS;  Surgeon: Kristeen Miss, MD;  Location: [place];  Service: Neurosurgery;  Laterality: N/A;   SCLEROTHERAPY  09/15/2022   Procedure: SCLEROTHERAPY;  Surgeon: Daneil Dolin, MD;  [facility];  Service: Endoscopy;;   SHOULDER SURGERY Bilateral    SPINAL CORD STIMULATOR REMOVAL N/A 02/25/2020   Procedure: LUMBAR SPINAL CORD STIMULATOR REMOVAL;  Surgeon: Kristeen Miss, MD;  Location: [place];  Service: Neurosurgery;  Laterality: N/A;   TEE WITHOUT CARDIOVERSION N/A 03/21/2018   Procedure: TRANSESOPHAGEAL ECHOCARDIOGRAM (TEE) WITH PROPOFOL;  Surgeon: [name], MD;  [facility];  Service: Cardiovascular;  Laterality: N/A;   Patient Active Problem List   Diagnosis Date Noted   History of diarrhea 05/12/2022   History of colonic polyps 05/12/2022   Bilateral carpal tunnel syndrome 01/08/2022   Carcinoma of prostate ([place]) 01/08/2022   Cough 01/08/2022   Fasciculation 50/53/9767   Follicular cyst of the skin and subcutaneous tissue, unspecified 01/08/2022   Hypertrophy of nasal turbinates 01/08/2022   PTSD (post-traumatic stress disorder) 06/17/2021   Stroke ([place]) 04/29/2021   Infection of spinal cord stimulator ([place]) 02/26/2020   Pseudogout of knee    Pyogenic arthritis of right knee joint ([place])    Falls frequently 05/08/2018   Fall    Sleep  disturbance    Post-op pain    Hypokalemia    Morbid obesity ([place])    Postoperative pain    DVT, lower extremity, distal, acute, bilateral ([facility])    Acute blood loss anemia    Anemia of chronic disease    Essential hypertension    Bacteremia    Myelopathy ([place]) 03/30/2018   Paraplegia ([facility])    Postlaminectomy syndrome, lumbar region    Epidural abscess    Abdominal distension    Encephalopathy    Weakness of both lower extremities    Discitis of thoracic region    Spinal cord stimulator status    Staphylococcus aureus sepsis ([place]) 03/20/2018   Bacteremia due to methicillin susceptible Staphylococcus aureus (MSSA) 03/18/2018   Obesity 03/18/2018   Nephrolithiasis 03/16/2018   AKI (acute kidney injury) ([place]) 03/16/2018   Cognitive impairment 03/16/2018   Chronic pain syndrome 03/16/2018   BPH (benign prostatic hyperplasia) 01/17/2018   Insomnia 01/17/2018   Discogenic cervical pain 10/13/2017   Cervico-occipital neuralgia 06/09/2016   Cervicogenic headache 01/28/2016   B12 deficiency 01/28/2016  Cervical radiculopathy 08/14/2015   Memory difficulty 07/23/2015   History of cerebrovascular disease 07/23/2015   FH: colon cancer 08/13/2014   Hx of adenomatous colonic polyps 08/13/2014   Right knee pain 08/04/2014   Depression 06/02/2012   Chronic back pain 05/30/2012   CAD, NATIVE VESSEL 07/17/2010   HYPERCHOLESTEROLEMIA 10/31/2009   SMOKELESS TOBACCO ABUSE 10/31/2009   Obstructive sleep apnea 10/31/2009   Essential hypertension, benign 10/31/2009   GERD 10/31/2009    PCP: Asencion Noble, MD  REFERRING PROVIDER: Asencion Noble, MD  REFERRING DIAG: PT eval/tx for hip pain due to fall  THERAPY DIAG:  No diagnosis found.  Rationale for Evaluation and Treatment: Rehabilitation  ONSET DATE: 10/31/22  SUBJECTIVE:   SUBJECTIVE STATEMENT: Pt reports pain resolved in Rt hip for a couple weeks, continues to have lower back pain.  Has been doing standing exercises at home.    PERTINENT HISTORY: Hx of CVA, chronic LBP, paraplegia   PAIN:  Are you having pain? Yes: NPRS scale: 0/10 seated and standing/10 Pain location: Rt hip  Pain description: sore, aching  Aggravating factors: standing, WB Relieving factors: Movement, non WB  PRECAUTIONS: Fall  WEIGHT BEARING RESTRICTIONS: No  FALLS:  Has patient fallen in last 6 months? Yes. Number of falls 5  LIVING ENVIRONMENT: Lives with: lives with their partner Lives in: House/apartment Stairs: No Has following equipment at home: Gilford Rile - 2 wheeled and Wheelchair (power)  OCCUPATION: Retired  PLOF: Needs assistance with ADLs, Needs assistance with homemaking, and Needs assistance with transfers  PATIENT GOALS: Get hip moving again   NEXT MD VISIT:   OBJECTIVE:   DIAGNOSTIC FINDINGS: IMPRESSION: No evidence of displaced hip or pelvic fracture. Please note that plain radiographs are significantly insensitive for hip and pelvic fracture, particularly in the setting of osteopenia.  PATIENT SURVEYS:  FOTO 12/02/22: 53% functional  12/24/22:  81% functional  COGNITION: Overall cognitive status: Within functional limits for tasks assessed    PALPATION: No notable TTP about RT hip   LOWER EXTREMITY ROM:  Active ROM Right eval Right  12/24/22 Left eval Left 12/24/22  Hip flexion 85 105 95 108  Hip extension 0  0   Hip abduction      Hip adduction      Hip internal rotation      Hip external rotation      Knee flexion      Knee extension      Ankle dorsiflexion      Ankle plantarflexion      Ankle inversion      Ankle eversion       (Blank rows = not tested)  LOWER EXTREMITY MMT:  MMT Right eval Right 12/29 Right  12/24/22 Left eval Left  12/29 Left  12/24/22  Hip flexion 4 3+ sitting  4/5 seated 4 3+ sitting  4-/5 seated  Hip extension        Hip abduction 3-  4-/5 seated 4-  4/5 seated  Hip adduction        Hip internal rotation        Hip external rotation        Knee flexion         Knee extension 3+  4/5 lacking full extension but good strength 4-  4/5 lacking full extension but good strength  Ankle dorsiflexion 4+  4+ 4+  4+  Ankle plantarflexion        Ankle inversion        Ankle eversion         (  Blank rows = not tested)  TODAY'S TREATMENT:                                                                                                                              DATE:  12/24/22: Reviewed goals MMT ROM FOTO 5 sit to stand with UE support RW stand with UE support RW with marching  Weight shifting Side stepping 2RT front of mat with RW Educated importance of continuing HEP for strengthening and to complete standing for bone and musculature strengthening.  12/17/22  stand with UE support RW with marching  Weight shifting Side stepping  Sitting; Hip abduction with green theraband x 10  Marching with green theraband x 10  Sit to stand x 10  Thera band for core strength and posture. B shoulder extension, rows scapular retraction 10 x @  12/15/22 Sit to stand elevated height 5x Ability to stand with UE support RW 3x 1' Weight shifting TKE 5x 5" with GTB standing with RW Marching with RW 10x 5" Seated: LAQ 10x each  Marching 10x each  12/09/22: Supine:  required 3 pillows this session Bridge with ball squeeze 15x cueing to reduce ER LTR 5x 20" SKTC 2x 20" with towel assistance Quad sets with verbal and tactile cueing 10x Windshield wiper Rt only 10x Seated: LAQ 5x each   12/07/22: Transfer SPT independently WC to mat  Sidelying Clam ER BLE   Clam IR BLE Supine: Bridge with ball squeeze 12x   12/02/22: Reviewed goals FOTO Supine: Glut sets 10 x5" LTR 10x 10" Clam with GTB 10x 5" Adduction with ball 10x5" Bridge with adduction ball 10x 5"  11/25/22 Eval  Supine clam Glute set LTR  Hip adduction iso    PATIENT EDUCATION:  Education details: on eval findings, POC and HEP  Person educated: Patient Education method:  Explanation Education comprehension: verbalized understanding  HOME EXERCISE PROGRAM: 12/24/22: Sit to stand Sidestep front of bed with RW             12/17/22 - Seated Hip Abduction with Resistance  - 1 x daily - 7 x weekly - 3 sets - 10 reps - Seated March  - 1 x daily - 7 x weekly - 3 sets - 10 reps Access Code: [email or web address] URL: [email or web address] Date: 11/25/2022 Prepared by: Josue Hector  Exercises - Hooklying Gluteal Sets  - 2-3 x daily - 7 x weekly - 1 sets - 10 reps - 5 second hold - Supine Hip Adduction Isometric with Ball  - 2-3 x daily - 7 x weekly - 1 sets - 10 reps - 5 second hold - Hooklying Clamshell with Resistance  - 2-3 x daily - 7 x weekly - 1 sets - 10 reps - 5 second hold - Supine Lower Trunk Rotation  - 2-3 x daily - 7 x weekly - 1 sets - 10 reps - 5 second hold  12/02/22:  Bridge with adduction  ASSESSMENT:  CLINICAL  IMPRESSION: Pt reports he has been pain free for weeks.  Reviewed goals this session with improve mobility, improved self perceived functional abilities with FOTO score, pt reports increased compliance with HEP and ability to stand without pain.  MMT complete in seated position due to LBP, some improvements noted.  Pt with history of attendance here years ago and increased weakness due to non-compliance iwht HEP once DC.  Stressed importance of continuing exercises at home for bone and musculature strength.     OBJECTIVE IMPAIRMENTS: decreased activity tolerance, decreased mobility, difficulty walking, decreased ROM, decreased strength, hypomobility, increased fascial restrictions, impaired flexibility, improper body mechanics, and pain.   ACTIVITY LIMITATIONS: standing, transfers, bed mobility, and locomotion level  PARTICIPATION LIMITATIONS: meal prep, cleaning, laundry, shopping, and community activity  PERSONAL FACTORS: Age, Time since onset of injury/illness/exacerbation, and 1-2 comorbidities: See above  are also affecting  patient's functional outcome.   REHAB POTENTIAL: Good  CLINICAL DECISION MAKING: Stable/uncomplicated  EVALUATION COMPLEXITY: Low   GOALS: SHORT TERM GOALS: Target date: 12/16/2022  Patient will be independent with initial HEP and self-management strategies to improve functional outcomes Baseline: 12/24/22:  Reports compliance with HEP Goal status: MET   LONG TERM GOALS: Target date: 01/06/2023  Patient will be independent with advanced HEP and self-management strategies to improve functional outcomes Baseline: 12/24/22:  Reports compliance with HEP Goal status: MET  2.  Patient will improve FOTO score to predicted value to indicate improvement in functional outcomes Baseline: 12/02/22: 53% functional; 81% functional Goal status: MET  3.  Patient will have RT hip flexion AROM within 5 degrees of contralateral side to improve functional mobility and ability to perform ADLs. Baseline: See AROM Goal status: MET  4. Patient will have equal to or > 4/5 MMT throughout RLE to improve ability to perform functional mobility, stair ambulation and ADLs.  Baseline: See MMT Goal status: IN PROGRESS   PLAN:  PT FREQUENCY: 2x/week  PT DURATION: 6 weeks  PLANNED INTERVENTIONS: Therapeutic exercises, Therapeutic activity, Neuromuscular re-education, Balance training, Gait training, Patient/Family education, Joint manipulation, Joint mobilization, Stair training, Aquatic Therapy, Dry Needling, Electrical stimulation, Spinal manipulation, Spinal mobilization, Cryotherapy, Moist heat, scar mobilization, Taping, Traction, Ultrasound, Biofeedback, Ionotophoresis '4mg'$ /ml Dexamethasone, and Manual therapy.   PLAN FOR NEXT SESSION:  DC to HEP.  Ihor Austin, LPTA/CLT; Delana Meyer [phone]

## 2022-12-28 ENCOUNTER — Encounter (HOSPITAL_COMMUNITY): Payer: Medicare Other | Admitting: Physical Therapy

## 2022-12-30 ENCOUNTER — Encounter (HOSPITAL_COMMUNITY): Payer: Medicare Other | Admitting: Physical Therapy

## 2023-01-04 ENCOUNTER — Encounter (HOSPITAL_COMMUNITY): Payer: Medicare Other | Admitting: Physical Therapy

## 2023-01-06 ENCOUNTER — Encounter (HOSPITAL_COMMUNITY): Payer: Medicare Other | Admitting: Physical Therapy

## 2023-01-11 ENCOUNTER — Other Ambulatory Visit: Payer: Self-pay | Admitting: Gastroenterology

## 2023-01-11 ENCOUNTER — Encounter (HOSPITAL_COMMUNITY): Payer: Medicare Other | Admitting: Physical Therapy

## 2023-01-14 ENCOUNTER — Other Ambulatory Visit: Payer: Self-pay | Admitting: Neurology

## 2023-01-21 NOTE — Progress Notes (Unsigned)
GI Office Note    Referring Provider: Asencion Noble, MD Primary Care Physician:  Asencion Noble, MD Primary Gastroenterologist: Cristopher Estimable.Rourk, MD   Date:  01/24/2023  ID:  Cody Velez, DOB [birth year], MRN [id number]   Chief Complaint   Chief Complaint  Patient presents with   Colonoscopy    Repeat colonoscopy.     History of Present Illness  Cody Velez is a 77 y.o. male with a history of GERD, B12 deficiency, CAD, dementia, depression, HTN, OSA using CPAP, HLD prostate cancer, stroke in 2022, and colon polyps presenting for follow up to discuss surveillance colonoscopy due in March.  Colonoscopy in September 2023 with multiple 3-25 mm in the ascending colon and cecum and markedly elongated and redundant colon.  Largest 25 mm polyp in the cecum removed in piecemeal fashion.  Recommended 27-monthsurveillance due to piecemeal polypectomy.  All revealed to be tubular adenomas.  Last office visit 11/16/2022.  Noted to have needed a modified prep for last colonoscopy with 2 days of clear liquids, holding dicyclomine, and using Linzess 290 mcg daily for 4 days prior to procedure.  GERD controlled on omeprazole 40 mg daily.  Usually has a bowel movement every 3 days.  Patient is wheelchair-bound with no feeling from waist down so unsure when bowel movements occur.  Patient was unclear about medications.  Advised to continue PPI daily, start Benefiber daily.  Advised on virtual visit prior to scheduling colonoscopy for March.  If Benefiber not helpful may need Linzess or similar medication for more predictable bowel movements.  Today:  GERD: Controlled on omeprazole 40 mg daily. Some occasional dysphagia.   Constipation: Did not try fiber supplement. Bowel movement frequency - Was going 1-2 times per day until he ran out of the blue pill. Last BM was Thursday or Friday. States he is not having good bowel movements since being out of his blue pill. Taking daily probiotic. Not straining. Has  medium little balls usually.   Current Outpatient Medications  Medication Sig Dispense Refill   acidophilus (RISAQUAD) CAPS capsule Take 1 capsule by mouth daily.     alfuzosin (UROXATRAL) 10 MG 24 hr tablet Take 10 mg by mouth daily with breakfast.     amLODipine (NORVASC) 5 MG tablet Take 2.5 mg by mouth daily.     Cholecalciferol (VITAMIN D) 50 MCG (2000 UT) CAPS Take 2,000 Units by mouth daily.     clopidogrel (PLAVIX) 75 MG tablet Take 1 tablet (75 mg total) by mouth daily. 30 tablet 2   cyanocobalamin (VITAMIN B12) 500 MCG tablet Take 500 mcg by mouth daily.     finasteride (PROSCAR) 5 MG tablet Take 1 tablet (5 mg total) by mouth daily. Reported on 05/04/2016 30 tablet 0   fluticasone (FLONASE) 50 MCG/ACT nasal spray Place 1 spray into both nostrils daily as needed for allergies. 16 g 2   gabapentin (NEURONTIN) 400 MG capsule TAKE ONE CAPSULE ('400MG'$  TOTAL) BY MOUTH TWO TIMES DAILY 180 capsule 3   Lifitegrast (XIIDRA) 5 % SOLN Place 1 drop into both eyes in the morning and at bedtime.     losartan (COZAAR) 100 MG tablet Take 100 mg by mouth daily.     omeprazole (PRILOSEC) 20 MG capsule Take 2 capsules (40 mg total) by mouth daily. 30 capsule 0   potassium chloride SA (KLOR-CON M) 20 MEQ tablet Take 20 mEq by mouth daily.     rosuvastatin (CRESTOR) 20 MG tablet Take 20 mg by mouth  daily.     traZODone (DESYREL) 100 MG tablet Take 1 tablet (100 mg total) by mouth at bedtime. 30 tablet 0   venlafaxine XR (EFFEXOR-XR) 150 MG 24 hr capsule Take 1 capsule (150 mg total) by mouth 2 (two) times daily. 60 capsule 0   baclofen (LIORESAL) 10 MG tablet TAKE ONE TABLET IN THE MORNING AND MIDDAY, THEN TWO TABLETS AT NIGHT (Patient not taking: Reported on 01/24/2023) 360 each 3   oxyCODONE-acetaminophen (PERCOCET/ROXICET) 5-325 MG tablet Take 1 tablet by mouth 2 (two) times daily as needed for pain. (Patient not taking: Reported on 01/24/2023)     No current facility-administered medications for this  visit.    Past Medical History:  Diagnosis Date   Allergic rhinitis    Arthritis    B12 deficiency    Cervicogenic headache    Childhood asthma    Chronic back pain    Coronary atherosclerosis of native coronary artery    Mild nonobstructive CAD at catheterization January 2015   Dementia [facility])    Depression    Essential hypertension    Falls    GERD (gastroesophageal reflux disease)    Glaucoma    History of blood transfusion    History of cerebrovascular disease 07/23/2015   History of kidney stones    History of pneumonia 02/2011   Hyperlipidemia    OSA (obstructive sleep apnea)    CPAP - Dr. [name] - uses cpap every night   Pneumonia    Prostate cancer [facility])    PTSD (post-traumatic stress disorder)    [place]   Rectal bleeding    Stroke ([place]) 03/2021   Wheelchair bound     Past Surgical History:  Procedure Laterality Date   APPLICATION OF ROBOTIC ASSISTANCE FOR SPINAL PROCEDURE N/A 03/28/2018   Procedure: APPLICATION OF ROBOTIC ASSISTANCE FOR SPINAL PROCEDURE;  Surgeon: Kristeen Miss, MD;  Location: [place];  Service: Neurosurgery;  Laterality: N/A;   BACK SURGERY  02/14/2012   lumbar OR #7; "today redid L1L2; replaced screws; added bone from hip"   BILATERAL KNEE ARTHROSCOPY     COLONOSCOPY  10/15/2008   Dr. [name]: tubular adenoma    COLONOSCOPY  12/17/2003   [id number] rectal and colon   COLONOSCOPY N/A 09/05/2014   tubular adenoma   COLONOSCOPY WITH PROPOFOL N/A 09/15/2022   Multiple 3-25 mm polyps in ascending colon and cecum, markedly elongated and redundant colon. One polyp removed piecemeal fashion. 6 month surveillance recommended. Tubular adenomas.   CYSTOSCOPY WITH STENT PLACEMENT Right 01/27/2016   Procedure: CYSTOSCOPY WITH STENT PLACEMENT;  Surgeon: [name], MD;  [facility];  Service: Urology;  Laterality: Right;   CYSTOSCOPY/RETROGRADE/URETEROSCOPY/STONE EXTRACTION WITH BASKET Right 01/27/2016   Procedure: CYSTOSCOPY, RIGHT RETROGRADE,  RIGHT URETEROSCOPY, STONE EXTRACTION ;  Surgeon: [name], MD;  Location: [facility];  Service: Urology;  Laterality: Right;   ESOPHAGOGASTRODUODENOSCOPY  10/15/2008   Dr Rourk:Schatzki's ring status post dilation and disruption via 27 F Maloney dilator/ otherwise unremarkable esophagus, small hiatal hernia, multiple fundal gland polyps not manipulated, gastritis, negative H.pylori   ESOPHAGOGASTRODUODENOSCOPY  06/21/2002   [id number] sliding hiatal hernia with mild changes of reflux esophagitis limited to gastroesophageal junction.  Noncritical ring at distal esophagus, 3 cm proximal to gastroesophageal junction/Antral gastritis   FACIAL COSMETIC SURGERY  ` 1985   "broke face playing softball"   FLEXIBLE SIGMOIDOSCOPY N/A 07/28/2022   Procedure: FLEXIBLE SIGMOIDOSCOPY;  Surgeon: Daneil Dolin, MD;  [facility];  Service: Endoscopy;  Laterality: N/A;  FRACTURE SURGERY     "left wrist; broke it; took spur off"   HOLMIUM LASER APPLICATION Right [id number]   Procedure: HOLMIUM LASER APPLICATION;  Surgeon: [name], MD;  Location: [facility];  Service: Urology;  Laterality: Right;   KNEE ARTHROSCOPY Right 05/18/2018   Procedure: PARTIAL MEDIAL MENISECTOMY AND SURGICAL LAVAGE AND CHONDROPLASTY;  Surgeon: [name], MD;  Location: [place];  Service: Orthopedics;  Laterality: Right;   LEFT HEART CATHETERIZATION WITH CORONARY ANGIOGRAM N/A 12/27/2013   Procedure: LEFT HEART CATHETERIZATION WITH CORONARY ANGIOGRAM;  Surgeon: [name], MD;  Location: [facility];  Service: Cardiovascular;  Laterality: N/A;   neck epidural     POLYPECTOMY  09/15/2022   Procedure: POLYPECTOMY;  Surgeon: Daneil Dolin, MD;  [facility];  Service: Endoscopy;;   POSTERIOR LUMBAR FUSION 4 LEVEL N/A 03/28/2018   Procedure: Thoracic eight -Lumbar two- FIXATION WITH SCREW PLACEMENT, DECOMPRESSION Thoracic ten-Thoracic eleven  FOR OSTEOMYELITIS;  Surgeon: Kristeen Miss, MD;  Location:  [place];  Service: Neurosurgery;  Laterality: N/A;   SCLEROTHERAPY  09/15/2022   Procedure: SCLEROTHERAPY;  Surgeon: Daneil Dolin, MD;  [facility];  Service: Endoscopy;;   SHOULDER SURGERY Bilateral    SPINAL CORD STIMULATOR REMOVAL N/A 02/25/2020   Procedure: LUMBAR SPINAL CORD STIMULATOR REMOVAL;  Surgeon: Kristeen Miss, MD;  Location: [place];  Service: Neurosurgery;  Laterality: N/A;   TEE WITHOUT CARDIOVERSION N/A 03/21/2018   Procedure: TRANSESOPHAGEAL ECHOCARDIOGRAM (TEE) WITH PROPOFOL;  Surgeon: [name], MD;  [facility];  Service: Cardiovascular;  Laterality: N/A;    Family History  Problem Relation Age of Onset   Emphysema Father    Heart failure Father    Lung cancer Father    CAD Father    Colon cancer Mother    Stroke Mother    Breast cancer Mother    Stroke Sister    Heart attack Brother    Dementia Paternal Uncle    Emphysema Maternal Grandmother    Stroke Maternal Grandmother    Asthma Other        grandson   Heart disease Paternal Grandfather    Anesthesia problems Neg Hx    Hypotension Neg Hx    Malignant hyperthermia Neg Hx    Pseudochol deficiency Neg Hx     Allergies as of 01/24/2023 - Review Complete 01/24/2023  Allergen Reaction Noted   Lisinopril Cough 07/05/2014    Social History   Socioeconomic History   Marital status: Widowed    Spouse name: Rise Paganini   Number of children: 3   Years of education: Not on file   Highest education level: Not on file  Occupational History   Occupation: Print production planner    Employer: [employer]  Tobacco Use   Smoking status: Former    Types: Cigarettes    Quit date: 12/20/1958    Years since quitting: 64.1   Smokeless tobacco: Current    Types: Chew  Vaping Use   Vaping Use: Never used  Substance and Sexual Activity   Alcohol use: Not Currently    Alcohol/week: 0.0 standard drinks of alcohol    Comment: couple bottles of wine per month   Drug use: No    Comment: "last used marijuana ~  1969"   Sexual activity: Not on file  Other Topics Concern   Not on file  Social History Narrative   Married, 3 daughters; retired   Patient drinks about 1 cup of coffee daily.  Patient is left handed.   Social Determinants of Health   Financial Resource Strain: Not on file  Food Insecurity: Not on file  Transportation Needs: Not on file  Physical Activity: Not on file  Stress: Not on file  Social Connections: Not on file     Review of Systems   Gen: Denies fever, chills, anorexia. Denies fatigue, weakness, weight loss.  CV: Denies chest pain, palpitations, syncope, peripheral edema, and claudication. Resp: Denies dyspnea at rest, cough, wheezing, coughing up blood, and pleurisy. GI: See HPI Derm: Denies rash, itching, dry skin Psych: Denies depression, anxiety, memory loss, confusion. No homicidal or suicidal ideation.  Heme: Denies bruising, bleeding, and enlarged lymph nodes.   Physical Exam   BP 136/78 (BP Location: Left Arm, Patient Position: Sitting, Cuff Size: Large)   Pulse 88   Temp 97.6 F (36.4 C) (Temporal)   Ht '6\' 3"'$  (1.905 m)   Wt 300 lb (136.1 kg)   SpO2 94%   BMI 37.50 kg/m   General:   Alert and oriented. No distress noted. Pleasant and cooperative.  Head:  Normocephalic and atraumatic. Eyes:  Conjuctiva clear without scleral icterus. Mouth:  Oral mucosa pink and moist. Good dentition. No lesions. Lungs:  Clear to auscultation bilaterally. No wheezes, rales, or rhonchi. No distress.  Heart:  S1, S2 present without murmurs appreciated.  Abdomen:  +BS, soft, non-tender and non-distended. No rebound or guarding. No HSM or masses noted. Rectal: deferred Msk: Wheelchair-bound. Extremities:  Without edema. Neurologic:  Alert and  oriented x4.  No feeling from waist down. Psych:  Alert and cooperative. Normal mood and affect.   Assessment  Cody Velez is a 77 y.o. male with a history of GERD, B12 deficiency, CAD, dementia, depression, HTN,  OSA using CPAP, HLD prostate cancer, stroke in 2022, and colon polyps presenting for follow up to discuss surveillance colonoscopy due in March.  History of colonic adenomas: History of colonic adenomas removed in 2015.  Failed attempted colonoscopy in August 2023 due to poor prep.  Last colonoscopy in September 2023 with multiple polyps removed, ranging 3 to 25 mm in size.  Larger polyp removed in piecemeal fashion.  All revealed to be tubular adenomas.  Given previous polyp removed in piecemeal fashion, repeat 6 months recommended.  Patient agreeable to proceed with repeat colonoscopy.  Will perform same prep as before with Linzess 290 mcg daily for 4 days prior, TriLyte prep, and 2 days of clear liquids.  Constipation: Denies any straining or discomfort.  Reports out of a blue pill that he felt like was helping him with constipation as it is gotten worse since he ran out and did not have a refill.  Patient unclear of the name of medication.  Patient has no feeling from waist down.  Last bowel movement about 4-5 days ago.  Linzess was helpful prior to last prep therefore we will trial Linzess 290 mcg daily.  Samples provided.  Advised if ongoing diarrhea occurs that we can reduce dose to 145 mcg.  GERD: Controlled on omeprazole 40 mg once daily.  PLAN   Proceed with colonoscopy with propofol by Dr. [name] in near future: the risks, benefits, and alternatives have been discussed with the patient in detail. The patient states understanding and desires to proceed. ASA 3 2 day clears TriLyte split prep Linzess 290 mcg daily for 4 days prior Hold dicyclomine Linzess 290 mcg daily. Samples provided. Continue omeprazole 40 mg daily. Call with update regarding medication that was  not refilled. Follow up in 3-4 months.    Venetia Night, MSN, FNP-BC, AGACNP-BC [facility]

## 2023-01-21 NOTE — H&P (View-Only) (Signed)
GI Office Note    Referring Provider: Asencion Noble, MD Primary Care Physician:  Asencion Noble, MD Primary Gastroenterologist: Cristopher Estimable.Rourk, MD   Date:  01/24/2023  ID:  Cody Velez, DOB [birth year], MRN [id number]   Chief Complaint   Chief Complaint  Patient presents with   Colonoscopy    Repeat colonoscopy.     History of Present Illness  Cody Velez is a 77 y.o. male with a history of GERD, B12 deficiency, CAD, dementia, depression, HTN, OSA using CPAP, HLD prostate cancer, stroke in 2022, and colon polyps presenting for follow up to discuss surveillance colonoscopy due in March.  Colonoscopy in September 2023 with multiple 3-25 mm in the ascending colon and cecum and markedly elongated and redundant colon.  Largest 25 mm polyp in the cecum removed in piecemeal fashion.  Recommended 27-monthsurveillance due to piecemeal polypectomy.  All revealed to be tubular adenomas.  Last office visit 11/16/2022.  Noted to have needed a modified prep for last colonoscopy with 2 days of clear liquids, holding dicyclomine, and using Linzess 290 mcg daily for 4 days prior to procedure.  GERD controlled on omeprazole 40 mg daily.  Usually has a bowel movement every 3 days.  Patient is wheelchair-bound with no feeling from waist down so unsure when bowel movements occur.  Patient was unclear about medications.  Advised to continue PPI daily, start Benefiber daily.  Advised on virtual visit prior to scheduling colonoscopy for March.  If Benefiber not helpful may need Linzess or similar medication for more predictable bowel movements.  Today:  GERD: Controlled on omeprazole 40 mg daily. Some occasional dysphagia.   Constipation: Did not try fiber supplement. Bowel movement frequency - Was going 1-2 times per day until he ran out of the blue pill. Last BM was Thursday or Friday. States he is not having good bowel movements since being out of his blue pill. Taking daily probiotic. Not straining. Has  medium little balls usually.   Current Outpatient Medications  Medication Sig Dispense Refill   acidophilus (RISAQUAD) CAPS capsule Take 1 capsule by mouth daily.     alfuzosin (UROXATRAL) 10 MG 24 hr tablet Take 10 mg by mouth daily with breakfast.     amLODipine (NORVASC) 5 MG tablet Take 2.5 mg by mouth daily.     Cholecalciferol (VITAMIN D) 50 MCG (2000 UT) CAPS Take 2,000 Units by mouth daily.     clopidogrel (PLAVIX) 75 MG tablet Take 1 tablet (75 mg total) by mouth daily. 30 tablet 2   cyanocobalamin (VITAMIN B12) 500 MCG tablet Take 500 mcg by mouth daily.     finasteride (PROSCAR) 5 MG tablet Take 1 tablet (5 mg total) by mouth daily. Reported on 05/04/2016 30 tablet 0   fluticasone (FLONASE) 50 MCG/ACT nasal spray Place 1 spray into both nostrils daily as needed for allergies. 16 g 2   gabapentin (NEURONTIN) 400 MG capsule TAKE ONE CAPSULE ('400MG'$  TOTAL) BY MOUTH TWO TIMES DAILY 180 capsule 3   Lifitegrast (XIIDRA) 5 % SOLN Place 1 drop into both eyes in the morning and at bedtime.     losartan (COZAAR) 100 MG tablet Take 100 mg by mouth daily.     omeprazole (PRILOSEC) 20 MG capsule Take 2 capsules (40 mg total) by mouth daily. 30 capsule 0   potassium chloride SA (KLOR-CON M) 20 MEQ tablet Take 20 mEq by mouth daily.     rosuvastatin (CRESTOR) 20 MG tablet Take 20 mg by mouth  daily.     traZODone (DESYREL) 100 MG tablet Take 1 tablet (100 mg total) by mouth at bedtime. 30 tablet 0   venlafaxine XR (EFFEXOR-XR) 150 MG 24 hr capsule Take 1 capsule (150 mg total) by mouth 2 (two) times daily. 60 capsule 0   baclofen (LIORESAL) 10 MG tablet TAKE ONE TABLET IN THE MORNING AND MIDDAY, THEN TWO TABLETS AT NIGHT (Patient not taking: Reported on 01/24/2023) 360 each 3   oxyCODONE-acetaminophen (PERCOCET/ROXICET) 5-325 MG tablet Take 1 tablet by mouth 2 (two) times daily as needed for pain. (Patient not taking: Reported on 01/24/2023)     No current facility-administered medications for this  visit.    Past Medical History:  Diagnosis Date   Allergic rhinitis    Arthritis    B12 deficiency    Cervicogenic headache    Childhood asthma    Chronic back pain    Coronary atherosclerosis of native coronary artery    Mild nonobstructive CAD at catheterization January 2015   Dementia [facility])    Depression    Essential hypertension    Falls    GERD (gastroesophageal reflux disease)    Glaucoma    History of blood transfusion    History of cerebrovascular disease 07/23/2015   History of kidney stones    History of pneumonia 02/2011   Hyperlipidemia    OSA (obstructive sleep apnea)    CPAP - Dr. [name] - uses cpap every night   Pneumonia    Prostate cancer [facility])    PTSD (post-traumatic stress disorder)    [place]   Rectal bleeding    Stroke ([place]) 03/2021   Wheelchair bound     Past Surgical History:  Procedure Laterality Date   APPLICATION OF ROBOTIC ASSISTANCE FOR SPINAL PROCEDURE N/A 03/28/2018   Procedure: APPLICATION OF ROBOTIC ASSISTANCE FOR SPINAL PROCEDURE;  Surgeon: Kristeen Miss, MD;  Location: [place];  Service: Neurosurgery;  Laterality: N/A;   BACK SURGERY  02/14/2012   lumbar OR #7; "today redid L1L2; replaced screws; added bone from hip"   BILATERAL KNEE ARTHROSCOPY     COLONOSCOPY  10/15/2008   Dr. [name]: tubular adenoma    COLONOSCOPY  12/17/2003   [id number] rectal and colon   COLONOSCOPY N/A 09/05/2014   tubular adenoma   COLONOSCOPY WITH PROPOFOL N/A 09/15/2022   Multiple 3-25 mm polyps in ascending colon and cecum, markedly elongated and redundant colon. One polyp removed piecemeal fashion. 6 month surveillance recommended. Tubular adenomas.   CYSTOSCOPY WITH STENT PLACEMENT Right 01/27/2016   Procedure: CYSTOSCOPY WITH STENT PLACEMENT;  Surgeon: [name], MD;  [facility];  Service: Urology;  Laterality: Right;   CYSTOSCOPY/RETROGRADE/URETEROSCOPY/STONE EXTRACTION WITH BASKET Right 01/27/2016   Procedure: CYSTOSCOPY, RIGHT RETROGRADE,  RIGHT URETEROSCOPY, STONE EXTRACTION ;  Surgeon: [name], MD;  Location: [facility];  Service: Urology;  Laterality: Right;   ESOPHAGOGASTRODUODENOSCOPY  10/15/2008   Dr Rourk:Schatzki's ring status post dilation and disruption via 27 F Maloney dilator/ otherwise unremarkable esophagus, small hiatal hernia, multiple fundal gland polyps not manipulated, gastritis, negative H.pylori   ESOPHAGOGASTRODUODENOSCOPY  06/21/2002   [id number] sliding hiatal hernia with mild changes of reflux esophagitis limited to gastroesophageal junction.  Noncritical ring at distal esophagus, 3 cm proximal to gastroesophageal junction/Antral gastritis   FACIAL COSMETIC SURGERY  ` 1985   "broke face playing softball"   FLEXIBLE SIGMOIDOSCOPY N/A 07/28/2022   Procedure: FLEXIBLE SIGMOIDOSCOPY;  Surgeon: Daneil Dolin, MD;  [facility];  Service: Endoscopy;  Laterality: N/A;  FRACTURE SURGERY     "left wrist; broke it; took spur off"   HOLMIUM LASER APPLICATION Right [id number]   Procedure: HOLMIUM LASER APPLICATION;  Surgeon: [name], MD;  Location: [facility];  Service: Urology;  Laterality: Right;   KNEE ARTHROSCOPY Right 05/18/2018   Procedure: PARTIAL MEDIAL MENISECTOMY AND SURGICAL LAVAGE AND CHONDROPLASTY;  Surgeon: [name], MD;  Location: [place];  Service: Orthopedics;  Laterality: Right;   LEFT HEART CATHETERIZATION WITH CORONARY ANGIOGRAM N/A 12/27/2013   Procedure: LEFT HEART CATHETERIZATION WITH CORONARY ANGIOGRAM;  Surgeon: [name], MD;  Location: [facility] C[facility];  Service: Cardiovascular;  Laterality: N/A;   neck epidural     POLYPECTOMY  09/15/2022   Procedure: POLYPECTOMY;  Surgeon: Daneil Dolin, MD;  [facility];  Service: Endoscopy;;   POSTERIOR LUMBAR FUSION 4 LEVEL N/A 03/28/2018   Procedure: Thoracic eight -Lumbar two- FIXATION WITH SCREW PLACEMENT, DECOMPRESSION Thoracic ten-Thoracic eleven  FOR OSTEOMYELITIS;  Surgeon: Kristeen Miss, MD;  Location:  [place];  Service: Neurosurgery;  Laterality: N/A;   SCLEROTHERAPY  09/15/2022   Procedure: SCLEROTHERAPY;  Surgeon: Daneil Dolin, MD;  [facility];  Service: Endoscopy;;   SHOULDER SURGERY Bilateral    SPINAL CORD STIMULATOR REMOVAL N/A 02/25/2020   Procedure: LUMBAR SPINAL CORD STIMULATOR REMOVAL;  Surgeon: Kristeen Miss, MD;  Location: [place];  Service: Neurosurgery;  Laterality: N/A;   TEE WITHOUT CARDIOVERSION N/A 03/21/2018   Procedure: TRANSESOPHAGEAL ECHOCARDIOGRAM (TEE) WITH PROPOFOL;  Surgeon: [name], MD;  [facility];  Service: Cardiovascular;  Laterality: N/A;    Family History  Problem Relation Age of Onset   Emphysema Father    Heart failure Father    Lung cancer Father    CAD Father    Colon cancer Mother    Stroke Mother    Breast cancer Mother    Stroke Sister    Heart attack Brother    Dementia Paternal Uncle    Emphysema Maternal Grandmother    Stroke Maternal Grandmother    Asthma Other        grandson   Heart disease Paternal Grandfather    Anesthesia problems Neg Hx    Hypotension Neg Hx    Malignant hyperthermia Neg Hx    Pseudochol deficiency Neg Hx     Allergies as of 01/24/2023 - Review Complete 01/24/2023  Allergen Reaction Noted   Lisinopril Cough 07/05/2014    Social History   Socioeconomic History   Marital status: Widowed    Spouse name: Rise Paganini   Number of children: 3   Years of education: Not on file   Highest education level: Not on file  Occupational History   Occupation: Print production planner    Employer: [employer]  Tobacco Use   Smoking status: Former    Types: Cigarettes    Quit date: 12/20/1958    Years since quitting: 64.1   Smokeless tobacco: Current    Types: Chew  Vaping Use   Vaping Use: Never used  Substance and Sexual Activity   Alcohol use: Not Currently    Alcohol/week: 0.0 standard drinks of alcohol    Comment: couple bottles of wine per month   Drug use: No    Comment: "last used marijuana ~  1969"   Sexual activity: Not on file  Other Topics Concern   Not on file  Social History Narrative   Married, 3 daughters; retired   Patient drinks about 1 cup of coffee daily.  Patient is left handed.   Social Determinants of Health   Financial Resource Strain: Not on file  Food Insecurity: Not on file  Transportation Needs: Not on file  Physical Activity: Not on file  Stress: Not on file  Social Connections: Not on file     Review of Systems   Gen: Denies fever, chills, anorexia. Denies fatigue, weakness, weight loss.  CV: Denies chest pain, palpitations, syncope, peripheral edema, and claudication. Resp: Denies dyspnea at rest, cough, wheezing, coughing up blood, and pleurisy. GI: See HPI Derm: Denies rash, itching, dry skin Psych: Denies depression, anxiety, memory loss, confusion. No homicidal or suicidal ideation.  Heme: Denies bruising, bleeding, and enlarged lymph nodes.   Physical Exam   BP 136/78 (BP Location: Left Arm, Patient Position: Sitting, Cuff Size: Large)   Pulse 88   Temp 97.6 F (36.4 C) (Temporal)   Ht '6\' 3"'$  (1.905 m)   Wt 300 lb (136.1 kg)   SpO2 94%   BMI 37.50 kg/m   General:   Alert and oriented. No distress noted. Pleasant and cooperative.  Head:  Normocephalic and atraumatic. Eyes:  Conjuctiva clear without scleral icterus. Mouth:  Oral mucosa pink and moist. Good dentition. No lesions. Lungs:  Clear to auscultation bilaterally. No wheezes, rales, or rhonchi. No distress.  Heart:  S1, S2 present without murmurs appreciated.  Abdomen:  +BS, soft, non-tender and non-distended. No rebound or guarding. No HSM or masses noted. Rectal: deferred Msk: Wheelchair-bound. Extremities:  Without edema. Neurologic:  Alert and  oriented x4.  No feeling from waist down. Psych:  Alert and cooperative. Normal mood and affect.   Assessment  Cody Velez is a 77 y.o. male with a history of GERD, B12 deficiency, CAD, dementia, depression, HTN,  OSA using CPAP, HLD prostate cancer, stroke in 2022, and colon polyps presenting for follow up to discuss surveillance colonoscopy due in March.  History of colonic adenomas: History of colonic adenomas removed in 2015.  Failed attempted colonoscopy in August 2023 due to poor prep.  Last colonoscopy in September 2023 with multiple polyps removed, ranging 3 to 25 mm in size.  Larger polyp removed in piecemeal fashion.  All revealed to be tubular adenomas.  Given previous polyp removed in piecemeal fashion, repeat 6 months recommended.  Patient agreeable to proceed with repeat colonoscopy.  Will perform same prep as before with Linzess 290 mcg daily for 4 days prior, TriLyte prep, and 2 days of clear liquids.  Constipation: Denies any straining or discomfort.  Reports out of a blue pill that he felt like was helping him with constipation as it is gotten worse since he ran out and did not have a refill.  Patient unclear of the name of medication.  Patient has no feeling from waist down.  Last bowel movement about 4-5 days ago.  Linzess was helpful prior to last prep therefore we will trial Linzess 290 mcg daily.  Samples provided.  Advised if ongoing diarrhea occurs that we can reduce dose to 145 mcg.  GERD: Controlled on omeprazole 40 mg once daily.  PLAN   Proceed with colonoscopy with propofol by Dr. [name] in near future: the risks, benefits, and alternatives have been discussed with the patient in detail. The patient states understanding and desires to proceed. ASA 3 2 day clears TriLyte split prep Linzess 290 mcg daily for 4 days prior Hold dicyclomine Linzess 290 mcg daily. Samples provided. Continue omeprazole 40 mg daily. Call with update regarding medication that was  not refilled. Follow up in 3-4 months.    Venetia Night, MSN, FNP-BC, AGACNP-BC [facility]

## 2023-01-24 ENCOUNTER — Ambulatory Visit (INDEPENDENT_AMBULATORY_CARE_PROVIDER_SITE_OTHER): Payer: Medicare Other | Admitting: Gastroenterology

## 2023-01-24 ENCOUNTER — Encounter: Payer: Self-pay | Admitting: Gastroenterology

## 2023-01-24 VITALS — BP 136/78 | HR 88 | Temp 97.6°F | Ht 75.0 in | Wt 300.0 lb

## 2023-01-24 DIAGNOSIS — K219 Gastro-esophageal reflux disease without esophagitis: Secondary | ICD-10-CM

## 2023-01-24 DIAGNOSIS — K5900 Constipation, unspecified: Secondary | ICD-10-CM

## 2023-01-24 DIAGNOSIS — Z86010 Personal history of colonic polyps: Secondary | ICD-10-CM

## 2023-01-24 MED ORDER — LINACLOTIDE 290 MCG PO CAPS: 290.0000 ug | ORAL_CAPSULE | Freq: Every day | ORAL | 2 refills | Status: DC

## 2023-01-24 NOTE — Patient Instructions (Signed)
We are scheduling you for a repeat colonoscopy in the near future with Dr. [name].  You will receive separate detailed written instructions however for your prep we will need to do 2 days of clear liquids and Linzess 290 mcg daily for 4 days prior -say one of your sample boxes for this.  Do not take any dicyclomine within 5 days prior to procedure.  I am providing some samples of Linzess 290 mcg.  You may take 1 tablet daily, on empty stomach (30 minutes prior to breakfast).  How to take Linzess: Once a day every day on empty stomach, at least 30 minutes before your first meal of the day. It is best to keep medications at a stable temperature Medication is best kept in its original bottle with the disket present.  It is a medication that is meant for everyday use and not to be used as needed.   What to expect: Constipation relief is typically felt in about 1 week Relief of abdominal pain, discomfort, and bloating begins in about 1 week with symptoms typically improving over 12 weeks and beyond. Diarrhea is most common side effect and typically begins within the first 2 weeks and can take 3-4 weeks to resolve It would be helpful to begin treatment over the weekend or when you can be closer to a bathroom   You can go to [email or web address] for patient support and sign up for daily medication reminders.   He may continue to take omeprazole 40 mg once daily.  Please look at your medications at home and let me know which medication was not able to be refilled.  It was a pleasure to see you today. I want to create trusting relationships with patients. If you receive a survey regarding your visit,  I greatly appreciate you taking time to fill this out on paper or through your MyChart. I value your feedback.  Venetia Night, MSN, FNP-BC, AGACNP-BC [facility]

## 2023-01-25 ENCOUNTER — Telehealth (INDEPENDENT_AMBULATORY_CARE_PROVIDER_SITE_OTHER): Payer: Self-pay | Admitting: *Deleted

## 2023-01-25 ENCOUNTER — Encounter: Payer: Self-pay | Admitting: *Deleted

## 2023-01-25 NOTE — Telephone Encounter (Signed)
LMOVM to call back to schedule TCS with Dr. [name], ASA 3, needs linzess 253mg x 4 days prior, 2 days clears, no dicyclomine, trilyte prep

## 2023-01-25 NOTE — Telephone Encounter (Signed)
Pt called back. He has been scheduled for 3/6 at 730am. Aware will send instructions/pre-op appt. Rx for prep to be sent to pharmacy. He has his linzess samples.

## 2023-02-21 ENCOUNTER — Encounter (HOSPITAL_COMMUNITY)
Admission: RE | Admit: 2023-02-21 | Discharge: 2023-02-21 | Disposition: A | Discharge status: Home / Self Care | Payer: Medicare Other | Source: Ambulatory Visit | Attending: Internal Medicine | Admitting: Internal Medicine

## 2023-02-21 ENCOUNTER — Other Ambulatory Visit: Payer: Self-pay | Admitting: *Deleted

## 2023-02-21 MED ORDER — PEG 3350-KCL-NA BICARB-NACL 420 G PO SOLR: 4000.0000 mL | Freq: Once | ORAL | 0 refills | Status: AC

## 2023-02-23 ENCOUNTER — Ambulatory Visit (HOSPITAL_COMMUNITY)
Admission: RE | Admit: 2023-02-23 | Discharge: 2023-02-23 | Disposition: A | Discharge status: Home / Self Care | Payer: Medicare Other | Attending: Internal Medicine | Admitting: Internal Medicine

## 2023-02-23 ENCOUNTER — Encounter (HOSPITAL_COMMUNITY)
Admission: RE | Disposition: A | Discharge status: Home / Self Care | Payer: Self-pay | Source: Home / Self Care | Attending: Internal Medicine

## 2023-02-23 ENCOUNTER — Encounter (HOSPITAL_COMMUNITY): Payer: Self-pay | Admitting: Internal Medicine

## 2023-02-23 ENCOUNTER — Ambulatory Visit (HOSPITAL_COMMUNITY): Payer: Medicare Other | Admitting: Certified Registered Nurse Anesthetist

## 2023-02-23 ENCOUNTER — Ambulatory Visit (HOSPITAL_BASED_OUTPATIENT_CLINIC_OR_DEPARTMENT_OTHER): Payer: Medicare Other | Admitting: Certified Registered Nurse Anesthetist

## 2023-02-23 DIAGNOSIS — K635 Polyp of colon: Secondary | ICD-10-CM

## 2023-02-23 DIAGNOSIS — K621 Rectal polyp: Secondary | ICD-10-CM

## 2023-02-23 DIAGNOSIS — E538 Deficiency of other specified B group vitamins: Secondary | ICD-10-CM | POA: Insufficient documentation

## 2023-02-23 DIAGNOSIS — Q438 Other specified congenital malformations of intestine: Secondary | ICD-10-CM | POA: Diagnosis not present

## 2023-02-23 DIAGNOSIS — Z8546 Personal history of malignant neoplasm of prostate: Secondary | ICD-10-CM | POA: Insufficient documentation

## 2023-02-23 DIAGNOSIS — D122 Benign neoplasm of ascending colon: Secondary | ICD-10-CM | POA: Diagnosis not present

## 2023-02-23 DIAGNOSIS — Z09 Encounter for follow-up examination after completed treatment for conditions other than malignant neoplasm: Secondary | ICD-10-CM | POA: Insufficient documentation

## 2023-02-23 DIAGNOSIS — D128 Benign neoplasm of rectum: Secondary | ICD-10-CM | POA: Diagnosis not present

## 2023-02-23 DIAGNOSIS — Z8673 Personal history of transient ischemic attack (TIA), and cerebral infarction without residual deficits: Secondary | ICD-10-CM | POA: Insufficient documentation

## 2023-02-23 DIAGNOSIS — I2510 Atherosclerotic heart disease of native coronary artery without angina pectoris: Secondary | ICD-10-CM

## 2023-02-23 DIAGNOSIS — G4733 Obstructive sleep apnea (adult) (pediatric): Secondary | ICD-10-CM | POA: Diagnosis not present

## 2023-02-23 DIAGNOSIS — K219 Gastro-esophageal reflux disease without esophagitis: Secondary | ICD-10-CM | POA: Diagnosis not present

## 2023-02-23 DIAGNOSIS — Z79899 Other long term (current) drug therapy: Secondary | ICD-10-CM | POA: Insufficient documentation

## 2023-02-23 DIAGNOSIS — Z87891 Personal history of nicotine dependence: Secondary | ICD-10-CM

## 2023-02-23 DIAGNOSIS — Z86010 Personal history of colonic polyps: Secondary | ICD-10-CM | POA: Diagnosis not present

## 2023-02-23 DIAGNOSIS — Z1211 Encounter for screening for malignant neoplasm of colon: Secondary | ICD-10-CM | POA: Diagnosis not present

## 2023-02-23 DIAGNOSIS — F32A Depression, unspecified: Secondary | ICD-10-CM | POA: Diagnosis not present

## 2023-02-23 DIAGNOSIS — F0390 Unspecified dementia without behavioral disturbance: Secondary | ICD-10-CM | POA: Diagnosis not present

## 2023-02-23 DIAGNOSIS — I10 Essential (primary) hypertension: Secondary | ICD-10-CM

## 2023-02-23 DIAGNOSIS — G4730 Sleep apnea, unspecified: Secondary | ICD-10-CM | POA: Diagnosis not present

## 2023-02-23 DIAGNOSIS — F0393 Unspecified dementia, unspecified severity, with mood disturbance: Secondary | ICD-10-CM | POA: Diagnosis not present

## 2023-02-23 DIAGNOSIS — Z993 Dependence on wheelchair: Secondary | ICD-10-CM | POA: Diagnosis not present

## 2023-02-23 HISTORY — PX: COLONOSCOPY WITH PROPOFOL: SHX5780

## 2023-02-23 HISTORY — PX: POLYPECTOMY: SHX149

## 2023-02-23 HISTORY — PX: HEMOSTASIS CLIP PLACEMENT: SHX6857

## 2023-02-23 SURGERY — COLONOSCOPY WITH PROPOFOL
Anesthesia: General

## 2023-02-23 MED ORDER — LACTATED RINGERS IV SOLN: INTRAVENOUS | Status: DC

## 2023-02-23 MED ORDER — PROPOFOL 500 MG/50ML IV EMUL
INTRAVENOUS | Status: DC | PRN
  Administered 2023-02-23: 150 ug/kg/min via INTRAVENOUS

## 2023-02-23 MED ORDER — LIDOCAINE HCL (CARDIAC) PF 100 MG/5ML IV SOSY
PREFILLED_SYRINGE | INTRAVENOUS | Status: DC | PRN
  Administered 2023-02-23: 50 mg via INTRAVENOUS

## 2023-02-23 MED ORDER — PROPOFOL 10 MG/ML IV BOLUS
INTRAVENOUS | Status: DC | PRN
  Administered 2023-02-23: 60 mg via INTRAVENOUS

## 2023-02-23 NOTE — Interval H&P Note (Signed)
History and Physical Interval Note:  02/23/2023 7:24 AM  [name]  has presented today for surgery, with the diagnosis of hx colon polyps.  The various methods of treatment have been discussed with the patient and family. After consideration of risks, benefits and other options for treatment, the patient has consented to  Procedure(s) with comments: COLONOSCOPY WITH PROPOFOL (N/A) - 730am, asa 3 as a surgical intervention.  The patient's history has been reviewed, patient examined, no change in status, stable for surgery.  I have reviewed the patient's chart and labs.  Questions were answered to the patient's satisfaction.     Cody Velez  No change.  Surveillance colonoscopy per plan.  He remains on Plavix per plan.  The risks, benefits, limitations, alternatives and imponderables have been reviewed with the patient. Questions have been answered. All parties are agreeable.

## 2023-02-23 NOTE — Anesthesia Preprocedure Evaluation (Signed)
Anesthesia Evaluation  Patient identified by MRN, date of birth, ID band Patient awake    Reviewed: Allergy & Precautions, H&P , NPO status , Patient's Chart, lab work & pertinent test results, reviewed documented beta blocker date and time   Airway Mallampati: II  TM Distance: >3 FB Neck ROM: full    Dental no notable dental hx.    Pulmonary neg pulmonary ROS, asthma , sleep apnea , pneumonia, former smoker   Pulmonary exam normal breath sounds clear to auscultation       Cardiovascular Exercise Tolerance: Good hypertension, + CAD  negative cardio ROS  Rhythm:regular Rate:Normal     Neuro/Psych  Headaches PSYCHIATRIC DISORDERS Anxiety Depression   Dementia  Neuromuscular disease CVA negative neurological ROS  negative psych ROS   GI/Hepatic negative GI ROS, Neg liver ROS,GERD  ,,  Endo/Other  negative endocrine ROS    Renal/GU Renal diseasenegative Renal ROS  negative genitourinary   Musculoskeletal   Abdominal   Peds  Hematology negative hematology ROS (+) Blood dyscrasia, anemia   Anesthesia Other Findings   Reproductive/Obstetrics negative OB ROS                             Anesthesia Physical Anesthesia Plan  ASA: 3  Anesthesia Plan: General   Post-op Pain Management:    Induction:   PONV Risk Score and Plan: Propofol infusion  Airway Management Planned:   Additional Equipment:   Intra-op Plan:   Post-operative Plan:   Informed Consent: I have reviewed the patients History and Physical, chart, labs and discussed the procedure including the risks, benefits and alternatives for the proposed anesthesia with the patient or authorized representative who has indicated his/her understanding and acceptance.     Dental Advisory Given  Plan Discussed with: CRNA  Anesthesia Plan Comments:        Anesthesia Quick Evaluation

## 2023-02-23 NOTE — Discharge Instructions (Signed)
  Colonoscopy Discharge Instructions  Read the instructions outlined below and refer to this sheet in the next few weeks. These discharge instructions provide you with general information on caring for yourself after you leave the hospital. Your doctor may also give you specific instructions. While your treatment has been planned according to the most current medical practices available, unavoidable complications occasionally occur. If you have any problems or questions after discharge, call Dr. [name] at [phone]. ACTIVITY You may resume your regular activity, but move at a slower pace for the next 24 hours.  Take frequent rest periods for the next 24 hours.  Walking will help get rid of the air and reduce the bloated feeling in your belly (abdomen).  No driving for 24 hours (because of the medicine (anesthesia) used during the test).   Do not sign any important legal documents or operate any machinery for 24 hours (because of the anesthesia used during the test).  NUTRITION Drink plenty of fluids.  You may resume your normal diet as instructed by your doctor.  Begin with a light meal and progress to your normal diet. Heavy or fried foods are harder to digest and may make you feel sick to your stomach (nauseated).  Avoid alcoholic beverages for 24 hours or as instructed.  MEDICATIONS You may resume your normal medications unless your doctor tells you otherwise.  WHAT YOU CAN EXPECT TODAY Some feelings of bloating in the abdomen.  Passage of more gas than usual.  Spotting of blood in your stool or on the toilet paper.  IF YOU HAD POLYPS REMOVED DURING THE COLONOSCOPY: No aspirin products for 7 days or as instructed.  No alcohol for 7 days or as instructed.  Eat a soft diet for the next 24 hours.  FINDING OUT THE RESULTS OF YOUR TEST Not all test results are available during your visit. If your test results are not back during the visit, make an appointment with your caregiver to find out the  results. Do not assume everything is normal if you have not heard from your caregiver or the medical facility. It is important for you to follow up on all of your test results.  SEEK IMMEDIATE MEDICAL ATTENTION IF: You have more than a spotting of blood in your stool.  Your belly is swollen (abdominal distention).  You are nauseated or vomiting.  You have a temperature over 101.  You have abdominal pain or discomfort that is severe or gets worse throughout the day.     Only 2 polyps found today-they were both removed  Clips placed on the larger polypectomy site to make sure does not bleed  Future MRI until clips gone  Further recommendations to follow pending review of pathology report  At patient request, I called Valli Glance at [phone] -rolled to voicemail.  Left a message.

## 2023-02-23 NOTE — Op Note (Signed)
[facility] Patient Name: Cody Velez Procedure Date: 02/23/2023 7:04 AM MRN: [id number] Date of Birth: 06/30/1946 Attending MD: Norvel Richards , MD, [id number] CSN: [id number] Age: 77 Admit Type: Outpatient Procedure:                Colonoscopy Indications:              High risk colon cancer surveillance: Personal                            history of colonic polyps Providers:                Norvel Richards, MD, Crystal Page, Wynonia Musty Tech, Technician Referring MD:              Medicines:                Propofol per Anesthesia Complications:            No immediate complications. Estimated Blood Loss:     Estimated blood loss was minimal. Estimated blood                            loss was minimal. Clips placed. Procedure:                Pre-Anesthesia Assessment:                           - Prior to the procedure, a History and Physical                            was performed, and patient medications and                            allergies were reviewed. The patient's tolerance of                            previous anesthesia was also reviewed. The risks                            and benefits of the procedure and the sedation                            options and risks were discussed with the patient.                            All questions were answered, and informed consent                            was obtained. Prior Anticoagulants: The patient has                            taken no anticoagulant or antiplatelet agents. ASA  Grade Assessment: III - A patient with severe                            systemic disease. After reviewing the risks and                            benefits, the patient was deemed in satisfactory                            condition to undergo the procedure.                           After obtaining informed consent, the colonoscope                            was passed  under direct vision. Throughout the                            procedure, the patient's blood pressure, pulse, and                            oxygen saturations were monitored continuously. The                            [id number]) scope was introduced through the                            anus and advanced to the the cecum, identified by                            appendiceal orifice and ileocecal valve. The                            colonoscopy was performed without difficulty. The                            patient tolerated the procedure well. The quality                            of the bowel preparation was adequate. The entire                            colon was well visualized. Scope In: 7:37:56 AM Scope Out: 8:16:21 AM Scope Withdrawal Time: 0 hours 16 minutes 49 seconds  Total Procedure Duration: 0 hours 38 minutes 25 seconds  Findings:      The perianal and digital rectal examinations were normal. Prep       marginally adequate. Elongated redundant colon. External abdominal       pressure and change in the patient's position required to reach the       cecum.      A 12 mm polyp was found in the rectum. The polyp was pedunculated. The       polyp was removed with a hot snare. Resection and retrieval were       complete.  Estimated blood loss: none.      A 6 mm polyp was found in the proximal ascending colon. The polyp was       semi-pedunculated. The polyp was removed with a cold snare. Resection       and retrieval were complete. Estimated blood loss was minimal.      The exam was otherwise without abnormality on direct and retroflexion       views. Impression:               - One 12 mm polyp in the rectum, removed with a hot                            snare. Resected and retrieved. Redundant/elongated                            colon.                           - One 6 mm polyp in the proximal ascending colon,                            removed with a cold snare.  Resected and retrieved.                            - The examination was otherwise normal on direct                            and retroflexion views. Moderate Sedation:      Moderate (conscious) sedation was personally administered by an       anesthesia professional. The following parameters were monitored: oxygen       saturation, heart rate, blood pressure, respiratory rate, EKG, adequacy       of pulmonary ventilation, and response to care. Recommendation:           - Patient has a contact number available for                            emergencies. The signs and symptoms of potential                            delayed complications were discussed with the                            patient. Return to normal activities tomorrow.                            Written discharge instructions were provided to the                            patient.                           - Advance diet as tolerated.                           - Continue present medications.                           -  Repeat colonoscopy date to be determined after                            pending pathology results are reviewed for                            surveillance.                           - Return to GI office (date not yet determined). Procedure Code(s):        --- Professional ---                           [id number], Colonoscopy, flexible; with removal of                            tumor(s), polyp(s), or other lesion(s) by snare                            technique Diagnosis Code(s):        --- Professional ---                           Z86.010, Personal history of colonic polyps                           D12.8, Benign neoplasm of rectum                           D12.2, Benign neoplasm of ascending colon CPT copyright 2022 American Medical Association. All rights reserved. The codes documented in this report are preliminary and upon coder review may  be revised to meet current compliance requirements. Cristopher Estimable.  Moises Terpstra, MD Norvel Richards, MD 02/23/2023 8:30:27 AM This report has been signed electronically. Number of Addenda: 0

## 2023-02-23 NOTE — Transfer of Care (Signed)
Immediate Anesthesia Transfer of Care Note  Patient: Cody Velez  Procedure(s) Performed: COLONOSCOPY WITH PROPOFOL POLYPECTOMY INTESTINAL HEMOSTASIS CLIP PLACEMENT  Patient Location: PACU  Anesthesia Type:General  Level of Consciousness: sedated  Airway & Oxygen Therapy: Patient Spontanous Breathing  Post-op Assessment: Report given to RN and Post -op Vital signs reviewed and stable  Post vital signs: Reviewed and stable  Last Vitals:  Vitals Value Taken Time  BP 108/88 02/23/23 0822  Temp 36.7 C 02/23/23 0822  Pulse 92 02/23/23 0822  Resp 17 02/23/23 0822  SpO2 91 % 02/23/23 0822    Last Pain:  Vitals:   02/23/23 0822  TempSrc: Oral  PainSc: Asleep      Patients Stated Pain Goal: 5 ([phone])  Complications: No notable events documented.

## 2023-02-24 ENCOUNTER — Encounter: Payer: Self-pay | Admitting: Internal Medicine

## 2023-02-24 LAB — SURGICAL PATHOLOGY

## 2023-02-24 NOTE — Anesthesia Postprocedure Evaluation (Signed)
Anesthesia Post Note  Patient: Cody Velez  Procedure(s) Performed: COLONOSCOPY WITH PROPOFOL POLYPECTOMY INTESTINAL HEMOSTASIS CLIP PLACEMENT  Patient location during evaluation: Phase II Anesthesia Type: General Level of consciousness: awake Pain management: pain level controlled Vital Signs Assessment: post-procedure vital signs reviewed and stable Respiratory status: spontaneous breathing and respiratory function stable Cardiovascular status: blood pressure returned to baseline and stable Postop Assessment: no headache and no apparent nausea or vomiting Anesthetic complications: no Comments: Late entry   No notable events documented.   Last Vitals:  Vitals:   02/23/23 0822 02/23/23 0831  BP: 108/88   Pulse: 92 89  Resp: 17 16  Temp: 36.7 C   SpO2: 91% 93%    Last Pain:  Vitals:   02/23/23 0831  TempSrc:   PainSc: 0-No pain                 Louann Sjogren

## 2023-03-04 DIAGNOSIS — Z6836 Body mass index (BMI) 36.0-36.9, adult: Secondary | ICD-10-CM | POA: Diagnosis not present

## 2023-03-04 DIAGNOSIS — M542 Cervicalgia: Secondary | ICD-10-CM | POA: Diagnosis not present

## 2023-03-07 ENCOUNTER — Encounter (HOSPITAL_COMMUNITY): Payer: Self-pay | Admitting: Internal Medicine

## 2023-03-08 ENCOUNTER — Encounter: Payer: Self-pay | Admitting: Neurology

## 2023-03-08 ENCOUNTER — Ambulatory Visit (INDEPENDENT_AMBULATORY_CARE_PROVIDER_SITE_OTHER): Payer: Medicare Other | Admitting: Neurology

## 2023-03-08 VITALS — BP 151/85 | HR 40 | Ht 77.0 in

## 2023-03-08 DIAGNOSIS — I63212 Cerebral infarction due to unspecified occlusion or stenosis of left vertebral arteries: Secondary | ICD-10-CM

## 2023-03-08 DIAGNOSIS — G959 Disease of spinal cord, unspecified: Secondary | ICD-10-CM

## 2023-03-08 DIAGNOSIS — R413 Other amnesia: Secondary | ICD-10-CM | POA: Diagnosis not present

## 2023-03-08 DIAGNOSIS — R4189 Other symptoms and signs involving cognitive functions and awareness: Secondary | ICD-10-CM | POA: Diagnosis not present

## 2023-03-08 MED ORDER — GABAPENTIN 400 MG PO CAPS: ORAL_CAPSULE | ORAL | 3 refills | Status: DC

## 2023-03-08 NOTE — Progress Notes (Signed)
PATIENT: Cody Velez DOB: 06-17-1946  REASON FOR VISIT: Follow up for cerebrovascular disease, gait disorder, thoracic myelopathy HISTORY FROM: Patient, girlfriend Cody Velez  PRIMARY NEUROLOGIST: Dr. Leta Velez  ASSESSMENT AND PLAN 77 y.o. year old male  1.  History of thoracic myelopathy 2.  Gait disorder 3.  Cerebrovascular disease  -Doing well, MMSE 29/30, he stopped Aricept and Namenda, does not seem to be much change in memory -Continue gabapentin for headache, nerve pain -On Plavix for secondary stroke prevention -Continue follow-up with PCP, follow-up here in 1 year  HISTORY OF PRESENT ILLNESS: Today 03/08/23 MMSE 29/30. Is no longer taking Aricept and Namenda, claims was told not to keep taking. His short term memory is poor. Trouble with recall. Significant other doesn't think any worse. Headaches have resolved. On gabapentin. Isn't taking baclofen. Has electric scooter, admits being lazy. Doesn't do much walking, has a pedal pusher. He can stand and walk short distance with a walker. Remains on Plavix. A fall 1 month ago, slipped getting into the car, forget to pull step up on scooter. Has a Lucianne Lei with hand controls. We talked about his original issue with spinal stimulator infection in 2019. Had right pontine stroke in 2022.   Update 03/03/2022  SS: Cody Velez here today for follow-up.  Headaches have resolved since restarting gabapentin and baclofen.  He remains on Plavix post pontine stroke in 2022.  He has residual right hemiparesis.  He is nonambulatory, but can stand for transfers.  He is sleeping well with trazodone.  Memory is stable, has benign forgetfulness.  Remains on Aricept, reportedly took himself off Namenda.  Claims he saw a neurologist at the [place], was told he did not have Alzheimer's disease.  He developed thoracic myelopathy back 4 years ago after he became septic from a spinal stimulator.  He is planning to go to the gym tomorrow working with a trainer on his  strength.  MMSE 29/30 today.  HISTORY  09/01/2021 Dr. Jannifer Velez: Cody Velez is a 77 year old left-handed white male with a history of a thoracic myelopathy and a gait disorder.  The patient sustained a pontine stroke earlier in 2022, he has had a mild residual right hemiparesis.  He is essentially nonambulatory at this point but he can stand for transfers.  He has altered a lot of his own medications recently, he has run out of gabapentin and reduce his baclofen dose and his trazodone dose.  He is not sleeping well, he has developed increased headaches in the back of the head and does not feel good in general.  He has had no new sudden episodes of weakness or numbness.  Headache has been daily for him recently.  REVIEW OF SYSTEMS: Out of a complete 14 system review of symptoms, the patient complains only of the following symptoms, and all other reviewed systems are negative.  See HPI  ALLERGIES: Allergies  Allergen Reactions   Lisinopril Cough    HOME MEDICATIONS: Outpatient Medications Prior to Visit  Medication Sig Dispense Refill   acidophilus (RISAQUAD) CAPS capsule Take 1 capsule by mouth daily.     alfuzosin (UROXATRAL) 10 MG 24 hr tablet Take 10 mg by mouth daily with breakfast.     amLODipine (NORVASC) 5 MG tablet Take 2.5 mg by mouth daily.     Cholecalciferol (VITAMIN D) 50 MCG (2000 UT) CAPS Take 2,000 Units by mouth daily.     clopidogrel (PLAVIX) 75 MG tablet Take 1 tablet (75 mg total) by mouth daily. [place]  tablet 2   cyanocobalamin (VITAMIN B12) 500 MCG tablet Take 500 mcg by mouth daily.     finasteride (PROSCAR) 5 MG tablet Take 1 tablet (5 mg total) by mouth daily. Reported on 05/04/2016 30 tablet 0   fluticasone (FLONASE) 50 MCG/ACT nasal spray Place 1 spray into both nostrils daily as needed for allergies. 16 g 2   gabapentin (NEURONTIN) 400 MG capsule TAKE ONE CAPSULE (400MG  TOTAL) BY MOUTH TWO TIMES DAILY 180 capsule 3   Lifitegrast (XIIDRA) 5 % SOLN Place 1 drop into both  eyes in the morning and at bedtime.     linaclotide (LINZESS) 290 MCG CAPS capsule Take 1 capsule (290 mcg total) by mouth daily before breakfast. 30 capsule 2   losartan (COZAAR) 100 MG tablet Take 100 mg by mouth daily.     omeprazole (PRILOSEC) 20 MG capsule Take 2 capsules (40 mg total) by mouth daily. 30 capsule 0   oxyCODONE-acetaminophen (PERCOCET/ROXICET) 5-325 MG tablet Take 1 tablet by mouth 2 (two) times daily as needed for severe pain.     potassium chloride SA (KLOR-CON M) 20 MEQ tablet Take 20 mEq by mouth daily.     rosuvastatin (CRESTOR) 20 MG tablet Take 20 mg by mouth daily.     traZODone (DESYREL) 100 MG tablet Take 1 tablet (100 mg total) by mouth at bedtime. 30 tablet 0   venlafaxine XR (EFFEXOR-XR) 150 MG 24 hr capsule Take 1 capsule (150 mg total) by mouth 2 (two) times daily. 60 capsule 0   No facility-administered medications prior to visit.    PAST MEDICAL HISTORY: Past Medical History:  Diagnosis Date   Allergic rhinitis    Arthritis    B12 deficiency    Cervicogenic headache    Childhood asthma    Chronic back pain    Coronary atherosclerosis of native coronary artery    Mild nonobstructive CAD at catheterization January 2015   Dementia [facility])    Depression    Essential hypertension    Falls    GERD (gastroesophageal reflux disease)    Glaucoma    History of blood transfusion    History of cerebrovascular disease 07/23/2015   History of kidney stones    History of pneumonia 02/2011   Hyperlipidemia    OSA (obstructive sleep apnea)    CPAP - Dr. Gwenette Velez - uses cpap every night   Pneumonia    Prostate cancer [facility])    PTSD (post-traumatic stress disorder)    [place]   Rectal bleeding    Stroke ([place]) 03/2021   Wheelchair bound     PAST SURGICAL HISTORY: Past Surgical History:  Procedure Laterality Date   APPLICATION OF ROBOTIC ASSISTANCE FOR SPINAL PROCEDURE N/A 03/28/2018   Procedure: APPLICATION OF ROBOTIC ASSISTANCE FOR SPINAL PROCEDURE;   Surgeon: Cody Miss, MD;  Location: [place];  Service: Neurosurgery;  Laterality: N/A;   BACK SURGERY  02/14/2012   lumbar OR #7; "today redid L1L2; replaced screws; added bone from hip"   BILATERAL KNEE ARTHROSCOPY     COLONOSCOPY  10/15/2008   Dr. [name]: tubular adenoma    COLONOSCOPY  12/17/2003   [id number] rectal and colon   COLONOSCOPY N/A 09/05/2014   tubular adenoma   COLONOSCOPY WITH PROPOFOL N/A 09/15/2022   Multiple 3-25 mm polyps in ascending colon and cecum, markedly elongated and redundant colon. One polyp removed piecemeal fashion. 6 month surveillance recommended. Tubular adenomas.   COLONOSCOPY WITH PROPOFOL N/A 02/23/2023   Procedure: COLONOSCOPY WITH PROPOFOL;  Surgeon:  Rourk, Cristopher Estimable, MD;  [facility];  Service: Endoscopy;  Laterality: N/A;  730am, asa 3   CYSTOSCOPY WITH STENT PLACEMENT Right 01/27/2016   Procedure: CYSTOSCOPY WITH STENT PLACEMENT;  Surgeon: [name], MD;  [facility];  Service: Urology;  Laterality: Right;   CYSTOSCOPY/RETROGRADE/URETEROSCOPY/STONE EXTRACTION WITH BASKET Right 01/27/2016   Procedure: CYSTOSCOPY, RIGHT RETROGRADE, RIGHT URETEROSCOPY, STONE EXTRACTION ;  Surgeon: [name], MD;  Location: [facility];  Service: Urology;  Laterality: Right;   ESOPHAGOGASTRODUODENOSCOPY  10/15/2008   Dr Rourk:Schatzki's ring status post dilation and disruption via 27 F Maloney dilator/ otherwise unremarkable esophagus, small hiatal hernia, multiple fundal gland polyps not manipulated, gastritis, negative H.pylori   ESOPHAGOGASTRODUODENOSCOPY  06/21/2002   [id number] sliding hiatal hernia with mild changes of reflux esophagitis limited to gastroesophageal junction.  Noncritical ring at distal esophagus, 3 cm proximal to gastroesophageal junction/Antral gastritis   FACIAL COSMETIC SURGERY  ` 1985   "broke face playing softball"   FLEXIBLE SIGMOIDOSCOPY N/A 07/28/2022   Procedure: FLEXIBLE SIGMOIDOSCOPY;  Surgeon: Daneil Dolin,  MD;  [facility];  Service: Endoscopy;  Laterality: N/A;   FRACTURE SURGERY     "left wrist; broke it; took spur off"   HEMOSTASIS CLIP PLACEMENT  02/23/2023   Procedure: HEMOSTASIS CLIP PLACEMENT;  Surgeon: Daneil Dolin, MD;  [facility];  Service: Endoscopy;;   HOLMIUM LASER APPLICATION Right [id number]   Procedure: HOLMIUM LASER APPLICATION;  Surgeon: [name], MD;  [facility];  Service: Urology;  Laterality: Right;   KNEE ARTHROSCOPY Right 05/18/2018   Procedure: PARTIAL MEDIAL MENISECTOMY AND SURGICAL LAVAGE AND CHONDROPLASTY;  Surgeon: [name], MD;  Location: [place];  Service: Orthopedics;  Laterality: Right;   LEFT HEART CATHETERIZATION WITH CORONARY ANGIOGRAM N/A 12/27/2013   Procedure: LEFT HEART CATHETERIZATION WITH CORONARY ANGIOGRAM;  Surgeon: [name], MD;  [facility];  Service: Cardiovascular;  Laterality: N/A;   neck epidural     POLYPECTOMY  09/15/2022   Procedure: POLYPECTOMY;  Surgeon: Daneil Dolin, MD;  [facility];  Service: Endoscopy;;   POLYPECTOMY  02/23/2023   Procedure: POLYPECTOMY INTESTINAL;  Surgeon: Daneil Dolin, MD;  [facility];  Service: Endoscopy;;   POSTERIOR LUMBAR FUSION 4 LEVEL N/A 03/28/2018   Procedure: Thoracic eight -Lumbar two- FIXATION WITH SCREW PLACEMENT, DECOMPRESSION Thoracic ten-Thoracic eleven  FOR OSTEOMYELITIS;  Surgeon: Cody Miss, MD;  Location: [place];  Service: Neurosurgery;  Laterality: N/A;   SCLEROTHERAPY  09/15/2022   Procedure: SCLEROTHERAPY;  Surgeon: Daneil Dolin, MD;  [facility];  Service: Endoscopy;;   SHOULDER SURGERY Bilateral    SPINAL CORD STIMULATOR REMOVAL N/A 02/25/2020   Procedure: LUMBAR SPINAL CORD STIMULATOR REMOVAL;  Surgeon: Cody Miss, MD;  Location: [place];  Service: Neurosurgery;  Laterality: N/A;   TEE WITHOUT CARDIOVERSION N/A 03/21/2018   Procedure: TRANSESOPHAGEAL ECHOCARDIOGRAM (TEE) WITH PROPOFOL;   Surgeon: [name], MD;  [facility];  Service: Cardiovascular;  Laterality: N/A;    FAMILY HISTORY: Family History  Problem Relation Age of Onset   Emphysema Father    Heart failure Father    Lung cancer Father    CAD Father    Colon cancer Mother    Stroke Mother    Breast cancer Mother    Stroke Sister    Heart attack Brother    Dementia Paternal Uncle    Emphysema Maternal Grandmother    Stroke Maternal  Grandmother    Asthma Other        grandson   Heart disease Paternal Grandfather    Anesthesia problems Neg Hx    Hypotension Neg Hx    Malignant hyperthermia Neg Hx    Pseudochol deficiency Neg Hx     SOCIAL HISTORY: Social History   Socioeconomic History   Marital status: Widowed    Spouse name: Rise Paganini   Number of children: 3   Years of education: Not on file   Highest education level: Not on file  Occupational History   Occupation: Print production planner    Employer: [employer]  Tobacco Use   Smoking status: Former    Types: Cigarettes    Quit date: 12/20/1958    Years since quitting: 64.2   Smokeless tobacco: Current    Types: Chew  Vaping Use   Vaping Use: Never used  Substance and Sexual Activity   Alcohol use: Not Currently    Alcohol/week: 0.0 standard drinks of alcohol    Comment: couple bottles of wine per month   Drug use: No    Comment: "last used marijuana ~ 1969"   Sexual activity: Not on file  Other Topics Concern   Not on file  Social History Narrative   Married, 3 daughters; retired   Patient drinks about 1 cup of coffee daily.   Patient is left handed.   Social Determinants of Health   Financial Resource Strain: Not on file  Food Insecurity: Not on file  Transportation Needs: Not on file  Physical Activity: Not on file  Stress: Not on file  Social Connections: Not on file  Intimate Partner Violence: Not on file   PHYSICAL EXAM  There were no vitals filed for this visit.  There is no height or weight on file to calculate  BMI.    03/03/2022    2:59 PM 09/01/2021    3:25 PM 10/16/2020    1:01 PM  MMSE - Mini Mental State Exam  Orientation to time 4 5 5   Orientation to Place 5 5 5   Registration 3 3 3   Attention/ Calculation 5 4 5   Recall 3 2 3   Language- name 2 objects 2 2 2   Language- repeat 1 1 1   Language- follow 3 step command 3 2 3   Language- read & follow direction 1 1 1   Write a sentence 1 1 1   Copy design 1 1 1   Total score 29 27 30     Generalized: Well developed, in no acute distress  Neurological examination  Mentation: Alert oriented to time, place, history taking. Follows all commands speech and language fluent Cranial nerve II-XII: Pupils were equal round reactive to light. Extraocular movements were full, visual field were full on confrontational test. Facial sensation and strength were normal. Head turning and shoulder shrug  were normal and symmetric. Motor: Good strength upper extremities, 3/5 bilateral lower extremities Sensory: Sensory testing is intact to soft touch on all 4 extremities. No evidence of extinction is noted.  Coordination: Cerebellar testing reveals good finger-nose-finger bilaterally Gait and station: Was not ambulated Reflexes: Deep tendon reflexes are symmetric   DIAGNOSTIC DATA (LABS, IMAGING, TESTING) - I reviewed patient records, labs, notes, testing and imaging myself where available.  Lab Results  Component Value Date   WBC 8.2 04/08/2021   HGB 14.7 04/08/2021   HCT 47.9 04/08/2021   MCV 92.8 04/08/2021   PLT 345 04/08/2021      Component Value Date/Time   NA 138 04/08/2021 1205  NA 139 02/06/2020 1814   K 4.0 04/08/2021 1205   CL 103 04/08/2021 1205   CO2 27 04/08/2021 1205   GLUCOSE 95 04/08/2021 1205   BUN 9 04/08/2021 1205   BUN 6 (L) 02/06/2020 1814   CREATININE 0.95 04/08/2021 1205   CREATININE 0.92 10/30/2020 1014   CALCIUM 9.2 04/08/2021 1205   PROT 7.5 04/08/2021 1205   PROT 7.1 02/06/2020 1814   ALBUMIN 3.9 04/08/2021 1205    ALBUMIN 3.8 02/06/2020 1814   AST 21 04/08/2021 1205   ALT 27 04/08/2021 1205   ALKPHOS 98 04/08/2021 1205   BILITOT 0.5 04/08/2021 1205   BILITOT <0.2 02/06/2020 1814   GFRNONAA >60 04/08/2021 1205   GFRAA >60 02/25/2020 1108   Lab Results  Component Value Date   CHOL 175 05/30/2012   HDL 28 (L) 05/30/2012   LDLCALC 108 (H) 05/30/2012   TRIG 194 (H) 05/30/2012   CHOLHDL 6.3 05/30/2012   No results found for: "HGBA1C" Lab Results  Component Value Date   [id number] 05/09/2018   Lab Results  Component Value Date   TSH 0.867 05/09/2018   Evangeline Dakin, DNP  [facility] [address] [place], [place] [address] [phone]

## 2023-03-08 NOTE — Patient Instructions (Signed)
Great to see you today Continue to monitor your memory  Continue Plavix for stroke prevention  Continue the gabapentin  See you back in 1 year

## 2023-03-17 DIAGNOSIS — M5414 Radiculopathy, thoracic region: Secondary | ICD-10-CM | POA: Diagnosis not present

## 2023-03-17 DIAGNOSIS — M5114 Intervertebral disc disorders with radiculopathy, thoracic region: Secondary | ICD-10-CM | POA: Diagnosis not present

## 2023-05-11 DIAGNOSIS — M546 Pain in thoracic spine: Secondary | ICD-10-CM | POA: Diagnosis not present

## 2023-05-12 ENCOUNTER — Encounter: Payer: Self-pay | Admitting: Gastroenterology

## 2023-05-17 DIAGNOSIS — M5414 Radiculopathy, thoracic region: Secondary | ICD-10-CM | POA: Diagnosis not present

## 2023-05-17 DIAGNOSIS — M5114 Intervertebral disc disorders with radiculopathy, thoracic region: Secondary | ICD-10-CM | POA: Diagnosis not present

## 2023-05-19 ENCOUNTER — Ambulatory Visit: Payer: Medicare Other | Attending: Cardiology | Admitting: *Deleted

## 2023-05-19 ENCOUNTER — Encounter: Payer: Self-pay | Admitting: *Deleted

## 2023-05-19 VITALS — Ht 77.0 in | Wt 290.0 lb

## 2023-05-19 DIAGNOSIS — I498 Other specified cardiac arrhythmias: Secondary | ICD-10-CM | POA: Diagnosis not present

## 2023-05-19 NOTE — Progress Notes (Signed)
   Covering for Dr. Diona Browner - EKG reviewed and shows normal sinus rhythm with PAC's and PVC's which have been noted on his prior EKG's and also noted on his prior monitor in 05/2022. Suspect his reading of a low heart rate was inaccurate given his extra beats as this is not typically captured on a pulse oximeter or home blood pressure cuff. If he has been feeling well, would not change medical therapy at this time. By review of notes, he has not been on beta-blocker therapy given that his heart rate was in the 50's at time by his prior monitor (lowest at that time was 57 bpm).  Signed, Ellsworth Lennox, PA-C 05/19/2023, 3:05 PM

## 2023-05-19 NOTE — Patient Instructions (Signed)
Your physician recommends that you continue on your current medications as directed. Please refer to the Current Medication list given to you today.  Your physician recommends that you schedule a follow-up appointment in: as planned  [place] for monitoring of EKGs at home: You can look into the [facility] device by [employer].This device is purchased by you and it connects to an application you download to your smart phone.  It can detect abnormal heart rhythms and alert you to contact your doctor for further evaluation. The device is approximately $90 and the phone application is free.  The web site is:  [email or web address]://www.[employer].com

## 2023-05-19 NOTE — Progress Notes (Signed)
Presents to office with daughter for nurse visit for EKG per recent phone note and low HR's. Denies fatigue, dizziness, chest pain or SOB. Medications reviewed. Reports taking all medications as prescribed. EKG done and sent to provider for review.

## 2023-06-13 DIAGNOSIS — W19XXXA Unspecified fall, initial encounter: Secondary | ICD-10-CM | POA: Diagnosis not present

## 2023-06-13 DIAGNOSIS — T1490XA Injury, unspecified, initial encounter: Secondary | ICD-10-CM | POA: Diagnosis not present

## 2023-06-14 ENCOUNTER — Ambulatory Visit (INDEPENDENT_AMBULATORY_CARE_PROVIDER_SITE_OTHER): Payer: Medicare Other | Admitting: Gastroenterology

## 2023-06-14 ENCOUNTER — Encounter: Payer: Self-pay | Admitting: Gastroenterology

## 2023-06-14 VITALS — BP 121/71 | HR 88 | Temp 97.5°F | Ht 75.0 in | Wt 290.0 lb

## 2023-06-14 DIAGNOSIS — Z86010 Personal history of colonic polyps: Secondary | ICD-10-CM | POA: Diagnosis not present

## 2023-06-14 DIAGNOSIS — K219 Gastro-esophageal reflux disease without esophagitis: Secondary | ICD-10-CM | POA: Diagnosis not present

## 2023-06-14 DIAGNOSIS — K5900 Constipation, unspecified: Secondary | ICD-10-CM

## 2023-06-14 DIAGNOSIS — T310 Burns involving less than 10% of body surface: Secondary | ICD-10-CM | POA: Diagnosis not present

## 2023-06-14 DIAGNOSIS — Z860101 Personal history of adenomatous and serrated colon polyps: Secondary | ICD-10-CM

## 2023-06-14 MED ORDER — OMEPRAZOLE 20 MG PO CPDR: 40.0000 mg | DELAYED_RELEASE_CAPSULE | Freq: Every day | ORAL | 4 refills | Status: DC

## 2023-06-14 NOTE — Patient Instructions (Addendum)
Tried taking Linzess 290 mcg daily to see if you can get on a more regular schedule.  As we discussed for the first 2 weeks you may have more frequent bowel movements and looser stools but this should stabilize.  If after 2 weeks that does not occur please let me know and we can reduce the dose of Linzess to 145 mcg daily or you can stop Linzess altogether and use your suppositories to help with better timing of bowel movements.  Continue your omeprazole 40 mg once daily.  I have updated the prescription to your pharmacy for you.  It was a pleasure to see you today. I want to create trusting relationships with patients. If you receive a survey regarding your visit,  I greatly appreciate you taking time to fill this out on paper or through your MyChart. I value your feedback.  Brooke Bonito, MSN, FNP-BC, AGACNP-BC [facility]

## 2023-06-14 NOTE — Addendum Note (Signed)
Addended by: Aida Raider on: 06/14/2023 10:47 AM   Modules accepted: Orders
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
## 2023-06-15 ENCOUNTER — Emergency Department (HOSPITAL_COMMUNITY)
Admission: EM | Admit: 2023-06-15 | Discharge: 2023-06-16 | Disposition: A | Discharge status: Home / Self Care | Payer: No Typology Code available for payment source | Attending: Emergency Medicine | Admitting: Emergency Medicine

## 2023-06-15 ENCOUNTER — Encounter (HOSPITAL_COMMUNITY): Payer: Self-pay

## 2023-06-15 ENCOUNTER — Other Ambulatory Visit: Payer: Self-pay

## 2023-06-15 DIAGNOSIS — T24211A Burn of second degree of right thigh, initial encounter: Secondary | ICD-10-CM | POA: Insufficient documentation

## 2023-06-15 DIAGNOSIS — Z981 Arthrodesis status: Secondary | ICD-10-CM | POA: Diagnosis not present

## 2023-06-15 DIAGNOSIS — W19XXXA Unspecified fall, initial encounter: Secondary | ICD-10-CM | POA: Insufficient documentation

## 2023-06-15 DIAGNOSIS — T25211A Burn of second degree of right ankle, initial encounter: Secondary | ICD-10-CM | POA: Diagnosis not present

## 2023-06-15 DIAGNOSIS — T300 Burn of unspecified body region, unspecified degree: Secondary | ICD-10-CM

## 2023-06-15 DIAGNOSIS — X19XXXA Contact with other heat and hot substances, initial encounter: Secondary | ICD-10-CM | POA: Insufficient documentation

## 2023-06-15 DIAGNOSIS — R9089 Other abnormal findings on diagnostic imaging of central nervous system: Secondary | ICD-10-CM | POA: Diagnosis not present

## 2023-06-15 DIAGNOSIS — T25221A Burn of second degree of right foot, initial encounter: Secondary | ICD-10-CM | POA: Insufficient documentation

## 2023-06-15 DIAGNOSIS — S12112A Nondisplaced Type II dens fracture, initial encounter for closed fracture: Secondary | ICD-10-CM | POA: Insufficient documentation

## 2023-06-15 DIAGNOSIS — S0990XA Unspecified injury of head, initial encounter: Secondary | ICD-10-CM | POA: Diagnosis not present

## 2023-06-15 DIAGNOSIS — S199XXA Unspecified injury of neck, initial encounter: Secondary | ICD-10-CM | POA: Diagnosis not present

## 2023-06-15 DIAGNOSIS — Z7901 Long term (current) use of anticoagulants: Secondary | ICD-10-CM | POA: Insufficient documentation

## 2023-06-15 DIAGNOSIS — Y92480 Sidewalk as the place of occurrence of the external cause: Secondary | ICD-10-CM | POA: Diagnosis not present

## 2023-06-15 DIAGNOSIS — M47812 Spondylosis without myelopathy or radiculopathy, cervical region: Secondary | ICD-10-CM | POA: Diagnosis not present

## 2023-06-15 DIAGNOSIS — M4802 Spinal stenosis, cervical region: Secondary | ICD-10-CM | POA: Diagnosis not present

## 2023-06-15 MED ORDER — DOXYCYCLINE HYCLATE 100 MG PO TABS
100.0000 mg | ORAL_TABLET | Freq: Once | ORAL | Status: AC
  Administered 2023-06-15: 100 mg via ORAL
  Filled 2023-06-15: qty 1

## 2023-06-15 MED ORDER — SILVER SULFADIAZINE 1 % EX CREA
TOPICAL_CREAM | Freq: Once | CUTANEOUS | Status: AC
  Filled 2023-06-15: qty 50

## 2023-06-15 MED ORDER — DOXYCYCLINE HYCLATE 100 MG PO CAPS: 100.0000 mg | ORAL_CAPSULE | Freq: Two times a day (BID) | ORAL | 0 refills | Status: DC

## 2023-06-15 NOTE — ED Notes (Signed)
Wound to buttock/hip and foot dressed.
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
## 2023-06-16 ENCOUNTER — Emergency Department (HOSPITAL_COMMUNITY): Payer: No Typology Code available for payment source

## 2023-06-16 DIAGNOSIS — S12112A Nondisplaced Type II dens fracture, initial encounter for closed fracture: Secondary | ICD-10-CM | POA: Diagnosis not present

## 2023-06-16 DIAGNOSIS — M47812 Spondylosis without myelopathy or radiculopathy, cervical region: Secondary | ICD-10-CM | POA: Diagnosis not present

## 2023-06-16 DIAGNOSIS — R9089 Other abnormal findings on diagnostic imaging of central nervous system: Secondary | ICD-10-CM | POA: Diagnosis not present

## 2023-06-16 DIAGNOSIS — S0990XA Unspecified injury of head, initial encounter: Secondary | ICD-10-CM | POA: Diagnosis not present

## 2023-06-16 DIAGNOSIS — S199XXA Unspecified injury of neck, initial encounter: Secondary | ICD-10-CM | POA: Diagnosis not present

## 2023-06-16 DIAGNOSIS — Z981 Arthrodesis status: Secondary | ICD-10-CM | POA: Diagnosis not present

## 2023-06-16 DIAGNOSIS — M4802 Spinal stenosis, cervical region: Secondary | ICD-10-CM | POA: Diagnosis not present

## 2023-06-16 MED ORDER — OXYCODONE-ACETAMINOPHEN 5-325 MG PO TABS
1.0000 | ORAL_TABLET | Freq: Once | ORAL | Status: AC
  Administered 2023-06-16: 1 via ORAL
  Filled 2023-06-16: qty 1

## 2023-06-16 MED ORDER — LORAZEPAM 1 MG PO TABS: 1.0000 mg | ORAL_TABLET | ORAL | Status: DC | PRN

## 2023-06-16 NOTE — ED Notes (Signed)
Pt back from MRI 

## 2023-06-16 NOTE — ED Notes (Signed)
Offered assistance from bed to wheelchair and wife and patient refused stating "we got this thank you for all your help"

## 2023-06-16 NOTE — ED Notes (Signed)
Charge RN aware of fall.

## 2023-06-16 NOTE — ED Notes (Signed)
Patient transported to MRI 

## 2023-06-16 NOTE — ED Notes (Signed)
Post fall paperwork finished with this RN and charge RN

## 2023-06-16 NOTE — ED Notes (Signed)
In room to introduce self. Bed in low position with call bell in reach.

## 2023-06-16 NOTE — ED Notes (Signed)
Urinal provided, wife assisting.
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
## 2023-06-16 NOTE — ED Notes (Signed)
Wife reports she is leaving and will be back in am
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
## 2023-06-21 DIAGNOSIS — S12110A Anterior displaced Type II dens fracture, initial encounter for closed fracture: Secondary | ICD-10-CM | POA: Diagnosis not present

## 2023-06-21 DIAGNOSIS — T24231A Burn of second degree of right lower leg, initial encounter: Secondary | ICD-10-CM | POA: Diagnosis not present

## 2023-06-27 DIAGNOSIS — S12110A Anterior displaced Type II dens fracture, initial encounter for closed fracture: Secondary | ICD-10-CM | POA: Diagnosis not present

## 2023-06-27 DIAGNOSIS — T24231A Burn of second degree of right lower leg, initial encounter: Secondary | ICD-10-CM | POA: Diagnosis not present

## 2023-07-07 DIAGNOSIS — S12100D Unspecified displaced fracture of second cervical vertebra, subsequent encounter for fracture with routine healing: Secondary | ICD-10-CM | POA: Diagnosis not present

## 2023-07-07 DIAGNOSIS — M542 Cervicalgia: Secondary | ICD-10-CM | POA: Diagnosis not present

## 2023-07-08 DIAGNOSIS — T25321A Burn of third degree of right foot, initial encounter: Secondary | ICD-10-CM | POA: Diagnosis not present

## 2023-07-08 DIAGNOSIS — S12110A Anterior displaced Type II dens fracture, initial encounter for closed fracture: Secondary | ICD-10-CM | POA: Diagnosis not present

## 2023-07-08 DIAGNOSIS — G8929 Other chronic pain: Secondary | ICD-10-CM | POA: Diagnosis not present

## 2023-07-08 DIAGNOSIS — Z79899 Other long term (current) drug therapy: Secondary | ICD-10-CM | POA: Diagnosis not present

## 2023-07-08 DIAGNOSIS — F32A Depression, unspecified: Secondary | ICD-10-CM | POA: Diagnosis not present

## 2023-07-08 DIAGNOSIS — I10 Essential (primary) hypertension: Secondary | ICD-10-CM | POA: Diagnosis not present

## 2023-07-08 DIAGNOSIS — G4730 Sleep apnea, unspecified: Secondary | ICD-10-CM | POA: Diagnosis not present

## 2023-07-08 DIAGNOSIS — T25321D Burn of third degree of right foot, subsequent encounter: Secondary | ICD-10-CM | POA: Diagnosis not present

## 2023-07-08 DIAGNOSIS — M549 Dorsalgia, unspecified: Secondary | ICD-10-CM | POA: Diagnosis not present

## 2023-07-08 DIAGNOSIS — T24311D Burn of third degree of right thigh, subsequent encounter: Secondary | ICD-10-CM | POA: Diagnosis not present

## 2023-07-08 DIAGNOSIS — E785 Hyperlipidemia, unspecified: Secondary | ICD-10-CM | POA: Diagnosis not present

## 2023-07-08 DIAGNOSIS — T24311A Burn of third degree of right thigh, initial encounter: Secondary | ICD-10-CM | POA: Diagnosis not present

## 2023-07-08 DIAGNOSIS — Z888 Allergy status to other drugs, medicaments and biological substances status: Secondary | ICD-10-CM | POA: Diagnosis not present

## 2023-07-08 DIAGNOSIS — T24231A Burn of second degree of right lower leg, initial encounter: Secondary | ICD-10-CM | POA: Diagnosis not present

## 2023-07-08 DIAGNOSIS — Z7902 Long term (current) use of antithrombotics/antiplatelets: Secondary | ICD-10-CM | POA: Diagnosis not present

## 2023-07-08 DIAGNOSIS — Z79891 Long term (current) use of opiate analgesic: Secondary | ICD-10-CM | POA: Diagnosis not present

## 2023-07-08 DIAGNOSIS — G629 Polyneuropathy, unspecified: Secondary | ICD-10-CM | POA: Diagnosis not present

## 2023-07-08 DIAGNOSIS — E669 Obesity, unspecified: Secondary | ICD-10-CM | POA: Diagnosis not present

## 2023-07-08 DIAGNOSIS — S12100D Unspecified displaced fracture of second cervical vertebra, subsequent encounter for fracture with routine healing: Secondary | ICD-10-CM | POA: Diagnosis not present

## 2023-07-08 DIAGNOSIS — Z86718 Personal history of other venous thrombosis and embolism: Secondary | ICD-10-CM | POA: Diagnosis not present

## 2023-07-13 DIAGNOSIS — Z7902 Long term (current) use of antithrombotics/antiplatelets: Secondary | ICD-10-CM | POA: Diagnosis not present

## 2023-07-13 DIAGNOSIS — I739 Peripheral vascular disease, unspecified: Secondary | ICD-10-CM | POA: Diagnosis not present

## 2023-07-13 DIAGNOSIS — T310 Burns involving less than 10% of body surface: Secondary | ICD-10-CM | POA: Diagnosis not present

## 2023-07-13 DIAGNOSIS — G4733 Obstructive sleep apnea (adult) (pediatric): Secondary | ICD-10-CM | POA: Diagnosis not present

## 2023-07-13 DIAGNOSIS — I69351 Hemiplegia and hemiparesis following cerebral infarction affecting right dominant side: Secondary | ICD-10-CM | POA: Diagnosis not present

## 2023-07-13 DIAGNOSIS — F32A Depression, unspecified: Secondary | ICD-10-CM | POA: Diagnosis not present

## 2023-07-13 DIAGNOSIS — S12100A Unspecified displaced fracture of second cervical vertebra, initial encounter for closed fracture: Secondary | ICD-10-CM | POA: Diagnosis not present

## 2023-07-13 DIAGNOSIS — I110 Hypertensive heart disease with heart failure: Secondary | ICD-10-CM | POA: Diagnosis not present

## 2023-07-13 DIAGNOSIS — I2510 Atherosclerotic heart disease of native coronary artery without angina pectoris: Secondary | ICD-10-CM | POA: Diagnosis not present

## 2023-07-13 DIAGNOSIS — G629 Polyneuropathy, unspecified: Secondary | ICD-10-CM | POA: Diagnosis not present

## 2023-07-13 DIAGNOSIS — E669 Obesity, unspecified: Secondary | ICD-10-CM | POA: Diagnosis not present

## 2023-07-13 DIAGNOSIS — I5020 Unspecified systolic (congestive) heart failure: Secondary | ICD-10-CM | POA: Diagnosis not present

## 2023-07-13 DIAGNOSIS — Z01810 Encounter for preprocedural cardiovascular examination: Secondary | ICD-10-CM | POA: Diagnosis not present

## 2023-07-13 DIAGNOSIS — T24331A Burn of third degree of right lower leg, initial encounter: Secondary | ICD-10-CM | POA: Diagnosis not present

## 2023-07-13 DIAGNOSIS — T25311A Burn of third degree of right ankle, initial encounter: Secondary | ICD-10-CM | POA: Diagnosis not present

## 2023-07-13 DIAGNOSIS — Z6833 Body mass index (BMI) 33.0-33.9, adult: Secondary | ICD-10-CM | POA: Diagnosis not present

## 2023-07-13 DIAGNOSIS — T25321A Burn of third degree of right foot, initial encounter: Secondary | ICD-10-CM | POA: Diagnosis not present

## 2023-07-13 DIAGNOSIS — Z993 Dependence on wheelchair: Secondary | ICD-10-CM | POA: Diagnosis not present

## 2023-07-13 DIAGNOSIS — Z86718 Personal history of other venous thrombosis and embolism: Secondary | ICD-10-CM | POA: Diagnosis not present

## 2023-07-13 DIAGNOSIS — Z888 Allergy status to other drugs, medicaments and biological substances status: Secondary | ICD-10-CM | POA: Diagnosis not present

## 2023-07-13 DIAGNOSIS — I5022 Chronic systolic (congestive) heart failure: Secondary | ICD-10-CM | POA: Diagnosis not present

## 2023-07-13 DIAGNOSIS — I491 Atrial premature depolarization: Secondary | ICD-10-CM | POA: Diagnosis not present

## 2023-07-13 DIAGNOSIS — E785 Hyperlipidemia, unspecified: Secondary | ICD-10-CM | POA: Diagnosis not present

## 2023-07-13 DIAGNOSIS — T24301A Burn of third degree of unspecified site of right lower limb, except ankle and foot, initial encounter: Secondary | ICD-10-CM | POA: Diagnosis not present

## 2023-07-13 DIAGNOSIS — Z79899 Other long term (current) drug therapy: Secondary | ICD-10-CM | POA: Diagnosis not present

## 2023-07-13 DIAGNOSIS — E440 Moderate protein-calorie malnutrition: Secondary | ICD-10-CM | POA: Diagnosis not present

## 2023-07-13 DIAGNOSIS — M549 Dorsalgia, unspecified: Secondary | ICD-10-CM | POA: Diagnosis not present

## 2023-07-13 DIAGNOSIS — G8929 Other chronic pain: Secondary | ICD-10-CM | POA: Diagnosis not present

## 2023-07-13 DIAGNOSIS — Z7409 Other reduced mobility: Secondary | ICD-10-CM | POA: Diagnosis not present

## 2023-07-14 DIAGNOSIS — T24311A Burn of third degree of right thigh, initial encounter: Secondary | ICD-10-CM | POA: Insufficient documentation

## 2023-07-17 DIAGNOSIS — I5020 Unspecified systolic (congestive) heart failure: Secondary | ICD-10-CM | POA: Insufficient documentation

## 2023-07-20 DIAGNOSIS — G4733 Obstructive sleep apnea (adult) (pediatric): Secondary | ICD-10-CM | POA: Diagnosis not present

## 2023-07-20 DIAGNOSIS — E440 Moderate protein-calorie malnutrition: Secondary | ICD-10-CM | POA: Diagnosis not present

## 2023-07-20 DIAGNOSIS — S12100D Unspecified displaced fracture of second cervical vertebra, subsequent encounter for fracture with routine healing: Secondary | ICD-10-CM | POA: Diagnosis not present

## 2023-07-20 DIAGNOSIS — J45909 Unspecified asthma, uncomplicated: Secondary | ICD-10-CM | POA: Diagnosis not present

## 2023-07-20 DIAGNOSIS — Z86718 Personal history of other venous thrombosis and embolism: Secondary | ICD-10-CM | POA: Diagnosis not present

## 2023-07-20 DIAGNOSIS — T24311D Burn of third degree of right thigh, subsequent encounter: Secondary | ICD-10-CM | POA: Diagnosis not present

## 2023-07-20 DIAGNOSIS — I5020 Unspecified systolic (congestive) heart failure: Secondary | ICD-10-CM | POA: Diagnosis not present

## 2023-07-20 DIAGNOSIS — T25321D Burn of third degree of right foot, subsequent encounter: Secondary | ICD-10-CM | POA: Diagnosis not present

## 2023-07-20 DIAGNOSIS — Z7902 Long term (current) use of antithrombotics/antiplatelets: Secondary | ICD-10-CM | POA: Diagnosis not present

## 2023-07-20 DIAGNOSIS — T25331D Burn of third degree of right toe(s) (nail), subsequent encounter: Secondary | ICD-10-CM | POA: Diagnosis not present

## 2023-07-20 DIAGNOSIS — Z79899 Other long term (current) drug therapy: Secondary | ICD-10-CM | POA: Diagnosis not present

## 2023-07-20 DIAGNOSIS — I2510 Atherosclerotic heart disease of native coronary artery without angina pectoris: Secondary | ICD-10-CM | POA: Diagnosis not present

## 2023-07-20 DIAGNOSIS — E785 Hyperlipidemia, unspecified: Secondary | ICD-10-CM | POA: Diagnosis not present

## 2023-07-20 DIAGNOSIS — I110 Hypertensive heart disease with heart failure: Secondary | ICD-10-CM | POA: Diagnosis not present

## 2023-07-20 DIAGNOSIS — I739 Peripheral vascular disease, unspecified: Secondary | ICD-10-CM | POA: Diagnosis not present

## 2023-07-20 DIAGNOSIS — I69351 Hemiplegia and hemiparesis following cerebral infarction affecting right dominant side: Secondary | ICD-10-CM | POA: Diagnosis not present

## 2023-07-20 DIAGNOSIS — M1990 Unspecified osteoarthritis, unspecified site: Secondary | ICD-10-CM | POA: Diagnosis not present

## 2023-07-20 DIAGNOSIS — Z9181 History of falling: Secondary | ICD-10-CM | POA: Diagnosis not present

## 2023-07-22 ENCOUNTER — Other Ambulatory Visit (HOSPITAL_COMMUNITY): Payer: Self-pay | Admitting: Internal Medicine

## 2023-07-22 DIAGNOSIS — G9340 Encephalopathy, unspecified: Secondary | ICD-10-CM

## 2023-07-22 DIAGNOSIS — I5022 Chronic systolic (congestive) heart failure: Secondary | ICD-10-CM | POA: Diagnosis not present

## 2023-07-25 DIAGNOSIS — T25331D Burn of third degree of right toe(s) (nail), subsequent encounter: Secondary | ICD-10-CM | POA: Diagnosis not present

## 2023-07-25 DIAGNOSIS — T25321D Burn of third degree of right foot, subsequent encounter: Secondary | ICD-10-CM | POA: Diagnosis not present

## 2023-07-25 DIAGNOSIS — I5020 Unspecified systolic (congestive) heart failure: Secondary | ICD-10-CM | POA: Diagnosis not present

## 2023-07-25 DIAGNOSIS — I110 Hypertensive heart disease with heart failure: Secondary | ICD-10-CM | POA: Diagnosis not present

## 2023-07-25 DIAGNOSIS — S12100D Unspecified displaced fracture of second cervical vertebra, subsequent encounter for fracture with routine healing: Secondary | ICD-10-CM | POA: Diagnosis not present

## 2023-07-25 DIAGNOSIS — T24311D Burn of third degree of right thigh, subsequent encounter: Secondary | ICD-10-CM | POA: Diagnosis not present

## 2023-07-26 DIAGNOSIS — Z993 Dependence on wheelchair: Secondary | ICD-10-CM | POA: Diagnosis not present

## 2023-07-26 DIAGNOSIS — T24311D Burn of third degree of right thigh, subsequent encounter: Secondary | ICD-10-CM | POA: Diagnosis not present

## 2023-07-26 DIAGNOSIS — S12100D Unspecified displaced fracture of second cervical vertebra, subsequent encounter for fracture with routine healing: Secondary | ICD-10-CM | POA: Diagnosis not present

## 2023-07-26 DIAGNOSIS — T25331D Burn of third degree of right toe(s) (nail), subsequent encounter: Secondary | ICD-10-CM | POA: Diagnosis not present

## 2023-07-26 DIAGNOSIS — T25321D Burn of third degree of right foot, subsequent encounter: Secondary | ICD-10-CM | POA: Diagnosis not present

## 2023-07-26 DIAGNOSIS — I110 Hypertensive heart disease with heart failure: Secondary | ICD-10-CM | POA: Diagnosis not present

## 2023-07-26 DIAGNOSIS — T24301D Burn of third degree of unspecified site of right lower limb, except ankle and foot, subsequent encounter: Secondary | ICD-10-CM | POA: Diagnosis not present

## 2023-07-26 DIAGNOSIS — I5020 Unspecified systolic (congestive) heart failure: Secondary | ICD-10-CM | POA: Diagnosis not present

## 2023-07-26 DIAGNOSIS — Z79891 Long term (current) use of opiate analgesic: Secondary | ICD-10-CM | POA: Diagnosis not present

## 2023-07-27 ENCOUNTER — Encounter: Payer: Self-pay | Admitting: Cardiology

## 2023-07-27 ENCOUNTER — Ambulatory Visit: Payer: Medicare Other | Attending: Physician Assistant | Admitting: Cardiology

## 2023-07-27 VITALS — BP 128/60 | HR 94 | Ht 75.0 in

## 2023-07-27 DIAGNOSIS — T25331D Burn of third degree of right toe(s) (nail), subsequent encounter: Secondary | ICD-10-CM | POA: Diagnosis not present

## 2023-07-27 DIAGNOSIS — I10 Essential (primary) hypertension: Secondary | ICD-10-CM | POA: Diagnosis not present

## 2023-07-27 DIAGNOSIS — T25321D Burn of third degree of right foot, subsequent encounter: Secondary | ICD-10-CM | POA: Diagnosis not present

## 2023-07-27 DIAGNOSIS — Z8673 Personal history of transient ischemic attack (TIA), and cerebral infarction without residual deficits: Secondary | ICD-10-CM | POA: Diagnosis not present

## 2023-07-27 DIAGNOSIS — T24311D Burn of third degree of right thigh, subsequent encounter: Secondary | ICD-10-CM | POA: Diagnosis not present

## 2023-07-27 DIAGNOSIS — S12100D Unspecified displaced fracture of second cervical vertebra, subsequent encounter for fracture with routine healing: Secondary | ICD-10-CM | POA: Diagnosis not present

## 2023-07-27 DIAGNOSIS — I5020 Unspecified systolic (congestive) heart failure: Secondary | ICD-10-CM | POA: Diagnosis not present

## 2023-07-27 DIAGNOSIS — I110 Hypertensive heart disease with heart failure: Secondary | ICD-10-CM | POA: Diagnosis not present

## 2023-07-27 MED ORDER — BISOPROLOL FUMARATE 5 MG PO TABS: 2.5000 mg | ORAL_TABLET | Freq: Every day | ORAL | 3 refills | Status: DC

## 2023-07-27 NOTE — Patient Instructions (Signed)
Medication Instructions:   START Bisoprolol 2.5 mg daily   Labwork: BMET in 1 week  Testing/Procedures: None today  Follow-Up: 4-6 weeks  Any Other Special Instructions Will Be Listed Below (If Applicable).  If you need a refill on your cardiac medications before your next appointment, please call your pharmacy.
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
## 2023-07-28 DIAGNOSIS — I5020 Unspecified systolic (congestive) heart failure: Secondary | ICD-10-CM | POA: Diagnosis not present

## 2023-07-28 DIAGNOSIS — T24311D Burn of third degree of right thigh, subsequent encounter: Secondary | ICD-10-CM | POA: Diagnosis not present

## 2023-07-28 DIAGNOSIS — T25321D Burn of third degree of right foot, subsequent encounter: Secondary | ICD-10-CM | POA: Diagnosis not present

## 2023-07-28 DIAGNOSIS — T25331D Burn of third degree of right toe(s) (nail), subsequent encounter: Secondary | ICD-10-CM | POA: Diagnosis not present

## 2023-07-28 DIAGNOSIS — S12100D Unspecified displaced fracture of second cervical vertebra, subsequent encounter for fracture with routine healing: Secondary | ICD-10-CM | POA: Diagnosis not present

## 2023-07-28 DIAGNOSIS — I110 Hypertensive heart disease with heart failure: Secondary | ICD-10-CM | POA: Diagnosis not present

## 2023-07-29 DIAGNOSIS — S12100D Unspecified displaced fracture of second cervical vertebra, subsequent encounter for fracture with routine healing: Secondary | ICD-10-CM | POA: Diagnosis not present

## 2023-07-30 ENCOUNTER — Encounter: Payer: Self-pay | Admitting: Cardiology

## 2023-07-30 DIAGNOSIS — T25331D Burn of third degree of right toe(s) (nail), subsequent encounter: Secondary | ICD-10-CM | POA: Diagnosis not present

## 2023-07-30 DIAGNOSIS — T25321D Burn of third degree of right foot, subsequent encounter: Secondary | ICD-10-CM | POA: Diagnosis not present

## 2023-07-30 DIAGNOSIS — I110 Hypertensive heart disease with heart failure: Secondary | ICD-10-CM | POA: Diagnosis not present

## 2023-07-30 DIAGNOSIS — T24311D Burn of third degree of right thigh, subsequent encounter: Secondary | ICD-10-CM | POA: Diagnosis not present

## 2023-07-30 DIAGNOSIS — S12100D Unspecified displaced fracture of second cervical vertebra, subsequent encounter for fracture with routine healing: Secondary | ICD-10-CM | POA: Diagnosis not present

## 2023-07-30 DIAGNOSIS — I5020 Unspecified systolic (congestive) heart failure: Secondary | ICD-10-CM | POA: Diagnosis not present

## 2023-08-01 ENCOUNTER — Ambulatory Visit: Payer: Medicare Other | Admitting: Physician Assistant

## 2023-08-02 ENCOUNTER — Ambulatory Visit (HOSPITAL_COMMUNITY)
Admission: RE | Admit: 2023-08-02 | Discharge: 2023-08-02 | Disposition: A | Discharge status: Home / Self Care | Payer: Medicare Other | Source: Ambulatory Visit | Attending: Internal Medicine | Admitting: Internal Medicine

## 2023-08-02 DIAGNOSIS — G9340 Encephalopathy, unspecified: Secondary | ICD-10-CM | POA: Diagnosis not present

## 2023-08-02 DIAGNOSIS — I6782 Cerebral ischemia: Secondary | ICD-10-CM | POA: Diagnosis not present

## 2023-08-02 DIAGNOSIS — G319 Degenerative disease of nervous system, unspecified: Secondary | ICD-10-CM | POA: Diagnosis not present

## 2023-08-02 MED ORDER — GADOBUTROL 1 MMOL/ML IV SOLN
10.0000 mL | Freq: Once | INTRAVENOUS | Status: AC | PRN
  Administered 2023-08-02: 10 mL via INTRAVENOUS

## 2023-08-03 ENCOUNTER — Encounter: Payer: Self-pay | Admitting: Cardiology

## 2023-08-03 ENCOUNTER — Telehealth: Payer: Self-pay | Admitting: Cardiology

## 2023-08-03 DIAGNOSIS — T24311D Burn of third degree of right thigh, subsequent encounter: Secondary | ICD-10-CM | POA: Diagnosis not present

## 2023-08-03 DIAGNOSIS — S12100D Unspecified displaced fracture of second cervical vertebra, subsequent encounter for fracture with routine healing: Secondary | ICD-10-CM | POA: Diagnosis not present

## 2023-08-03 DIAGNOSIS — T25331D Burn of third degree of right toe(s) (nail), subsequent encounter: Secondary | ICD-10-CM | POA: Diagnosis not present

## 2023-08-03 DIAGNOSIS — I5020 Unspecified systolic (congestive) heart failure: Secondary | ICD-10-CM | POA: Diagnosis not present

## 2023-08-03 DIAGNOSIS — I110 Hypertensive heart disease with heart failure: Secondary | ICD-10-CM | POA: Diagnosis not present

## 2023-08-03 DIAGNOSIS — T25321D Burn of third degree of right foot, subsequent encounter: Secondary | ICD-10-CM | POA: Diagnosis not present

## 2023-08-03 NOTE — Telephone Encounter (Signed)
STAT if HR is under 50 or over 120 (normal HR is 60-100 beats per minute)  What is your heart rate?  Ranging 33-40  Do you have a log of your heart rate readings (document readings)?   Do you have any other symptoms?   Per Nedra Hai, Case Manager with [facility], patient is asymptomatic. Nedra Hai just left patient's house. She states HR dropped significantly with starting on Bisoprolol. She mentions that his HR is irregular, but she states it is always irregular.

## 2023-08-03 NOTE — Telephone Encounter (Signed)
Spoke with pt who states that he has been experiencing tiredness but denies being dizzy or SOB.

## 2023-08-03 NOTE — Telephone Encounter (Signed)
Called pt. No answer. Left msg to call back.  

## 2023-08-04 DIAGNOSIS — T25331D Burn of third degree of right toe(s) (nail), subsequent encounter: Secondary | ICD-10-CM | POA: Diagnosis not present

## 2023-08-04 DIAGNOSIS — T25321D Burn of third degree of right foot, subsequent encounter: Secondary | ICD-10-CM | POA: Diagnosis not present

## 2023-08-04 DIAGNOSIS — S12100D Unspecified displaced fracture of second cervical vertebra, subsequent encounter for fracture with routine healing: Secondary | ICD-10-CM | POA: Diagnosis not present

## 2023-08-04 DIAGNOSIS — I110 Hypertensive heart disease with heart failure: Secondary | ICD-10-CM | POA: Diagnosis not present

## 2023-08-04 DIAGNOSIS — T24311D Burn of third degree of right thigh, subsequent encounter: Secondary | ICD-10-CM | POA: Diagnosis not present

## 2023-08-04 DIAGNOSIS — I5020 Unspecified systolic (congestive) heart failure: Secondary | ICD-10-CM | POA: Diagnosis not present

## 2023-08-04 NOTE — Telephone Encounter (Signed)
Pt scheduled for EKG on 08/04/23.

## 2023-08-05 ENCOUNTER — Ambulatory Visit: Payer: Medicare Other | Attending: Cardiology | Admitting: *Deleted

## 2023-08-05 DIAGNOSIS — R001 Bradycardia, unspecified: Secondary | ICD-10-CM

## 2023-08-05 NOTE — Progress Notes (Signed)
Pt in office for EKG d/t home health nurse reporting Hr in the 30's.

## 2023-08-06 DIAGNOSIS — T25331D Burn of third degree of right toe(s) (nail), subsequent encounter: Secondary | ICD-10-CM | POA: Diagnosis not present

## 2023-08-06 DIAGNOSIS — I5020 Unspecified systolic (congestive) heart failure: Secondary | ICD-10-CM | POA: Diagnosis not present

## 2023-08-06 DIAGNOSIS — S12100D Unspecified displaced fracture of second cervical vertebra, subsequent encounter for fracture with routine healing: Secondary | ICD-10-CM | POA: Diagnosis not present

## 2023-08-06 DIAGNOSIS — T24311D Burn of third degree of right thigh, subsequent encounter: Secondary | ICD-10-CM | POA: Diagnosis not present

## 2023-08-06 DIAGNOSIS — T25321D Burn of third degree of right foot, subsequent encounter: Secondary | ICD-10-CM | POA: Diagnosis not present

## 2023-08-06 DIAGNOSIS — I110 Hypertensive heart disease with heart failure: Secondary | ICD-10-CM | POA: Diagnosis not present

## 2023-08-09 DIAGNOSIS — I110 Hypertensive heart disease with heart failure: Secondary | ICD-10-CM | POA: Diagnosis not present

## 2023-08-09 DIAGNOSIS — T25321D Burn of third degree of right foot, subsequent encounter: Secondary | ICD-10-CM | POA: Diagnosis not present

## 2023-08-09 DIAGNOSIS — T25331D Burn of third degree of right toe(s) (nail), subsequent encounter: Secondary | ICD-10-CM | POA: Diagnosis not present

## 2023-08-09 DIAGNOSIS — T24311D Burn of third degree of right thigh, subsequent encounter: Secondary | ICD-10-CM | POA: Diagnosis not present

## 2023-08-09 DIAGNOSIS — I5020 Unspecified systolic (congestive) heart failure: Secondary | ICD-10-CM | POA: Diagnosis not present

## 2023-08-09 DIAGNOSIS — S12100D Unspecified displaced fracture of second cervical vertebra, subsequent encounter for fracture with routine healing: Secondary | ICD-10-CM | POA: Diagnosis not present

## 2023-08-11 ENCOUNTER — Other Ambulatory Visit: Payer: Self-pay | Admitting: Cardiology

## 2023-08-11 ENCOUNTER — Ambulatory Visit: Payer: Medicare Other

## 2023-08-11 DIAGNOSIS — T24311D Burn of third degree of right thigh, subsequent encounter: Secondary | ICD-10-CM | POA: Diagnosis not present

## 2023-08-11 DIAGNOSIS — I110 Hypertensive heart disease with heart failure: Secondary | ICD-10-CM | POA: Diagnosis not present

## 2023-08-11 DIAGNOSIS — R001 Bradycardia, unspecified: Secondary | ICD-10-CM

## 2023-08-11 DIAGNOSIS — T25321D Burn of third degree of right foot, subsequent encounter: Secondary | ICD-10-CM | POA: Diagnosis not present

## 2023-08-11 DIAGNOSIS — S12100D Unspecified displaced fracture of second cervical vertebra, subsequent encounter for fracture with routine healing: Secondary | ICD-10-CM | POA: Diagnosis not present

## 2023-08-11 DIAGNOSIS — I5020 Unspecified systolic (congestive) heart failure: Secondary | ICD-10-CM | POA: Diagnosis not present

## 2023-08-11 DIAGNOSIS — T25331D Burn of third degree of right toe(s) (nail), subsequent encounter: Secondary | ICD-10-CM | POA: Diagnosis not present

## 2023-08-13 DIAGNOSIS — R001 Bradycardia, unspecified: Secondary | ICD-10-CM | POA: Diagnosis not present

## 2023-08-15 DIAGNOSIS — I5020 Unspecified systolic (congestive) heart failure: Secondary | ICD-10-CM | POA: Diagnosis not present

## 2023-08-15 DIAGNOSIS — T25321A Burn of third degree of right foot, initial encounter: Secondary | ICD-10-CM | POA: Diagnosis not present

## 2023-08-15 DIAGNOSIS — I10 Essential (primary) hypertension: Secondary | ICD-10-CM | POA: Diagnosis not present

## 2023-08-15 DIAGNOSIS — I110 Hypertensive heart disease with heart failure: Secondary | ICD-10-CM | POA: Diagnosis not present

## 2023-08-15 DIAGNOSIS — T24311A Burn of third degree of right thigh, initial encounter: Secondary | ICD-10-CM | POA: Diagnosis not present

## 2023-08-15 DIAGNOSIS — R7309 Other abnormal glucose: Secondary | ICD-10-CM | POA: Diagnosis not present

## 2023-08-15 DIAGNOSIS — T24311D Burn of third degree of right thigh, subsequent encounter: Secondary | ICD-10-CM | POA: Diagnosis not present

## 2023-08-15 DIAGNOSIS — E785 Hyperlipidemia, unspecified: Secondary | ICD-10-CM | POA: Diagnosis not present

## 2023-08-15 DIAGNOSIS — T24301D Burn of third degree of unspecified site of right lower limb, except ankle and foot, subsequent encounter: Secondary | ICD-10-CM | POA: Diagnosis not present

## 2023-08-16 DIAGNOSIS — T24311D Burn of third degree of right thigh, subsequent encounter: Secondary | ICD-10-CM | POA: Diagnosis not present

## 2023-08-16 DIAGNOSIS — T25321D Burn of third degree of right foot, subsequent encounter: Secondary | ICD-10-CM | POA: Diagnosis not present

## 2023-08-16 DIAGNOSIS — S12100D Unspecified displaced fracture of second cervical vertebra, subsequent encounter for fracture with routine healing: Secondary | ICD-10-CM | POA: Diagnosis not present

## 2023-08-16 DIAGNOSIS — I5020 Unspecified systolic (congestive) heart failure: Secondary | ICD-10-CM | POA: Diagnosis not present

## 2023-08-16 DIAGNOSIS — I110 Hypertensive heart disease with heart failure: Secondary | ICD-10-CM | POA: Diagnosis not present

## 2023-08-16 DIAGNOSIS — T25331D Burn of third degree of right toe(s) (nail), subsequent encounter: Secondary | ICD-10-CM | POA: Diagnosis not present

## 2023-08-18 DIAGNOSIS — T25321D Burn of third degree of right foot, subsequent encounter: Secondary | ICD-10-CM | POA: Diagnosis not present

## 2023-08-18 DIAGNOSIS — T25331D Burn of third degree of right toe(s) (nail), subsequent encounter: Secondary | ICD-10-CM | POA: Diagnosis not present

## 2023-08-18 DIAGNOSIS — T24311D Burn of third degree of right thigh, subsequent encounter: Secondary | ICD-10-CM | POA: Diagnosis not present

## 2023-08-18 DIAGNOSIS — I110 Hypertensive heart disease with heart failure: Secondary | ICD-10-CM | POA: Diagnosis not present

## 2023-08-18 DIAGNOSIS — S12100D Unspecified displaced fracture of second cervical vertebra, subsequent encounter for fracture with routine healing: Secondary | ICD-10-CM | POA: Diagnosis not present

## 2023-08-18 DIAGNOSIS — I5020 Unspecified systolic (congestive) heart failure: Secondary | ICD-10-CM | POA: Diagnosis not present

## 2023-08-19 DIAGNOSIS — G4733 Obstructive sleep apnea (adult) (pediatric): Secondary | ICD-10-CM | POA: Diagnosis not present

## 2023-08-19 DIAGNOSIS — I110 Hypertensive heart disease with heart failure: Secondary | ICD-10-CM | POA: Diagnosis not present

## 2023-08-19 DIAGNOSIS — J45909 Unspecified asthma, uncomplicated: Secondary | ICD-10-CM | POA: Diagnosis not present

## 2023-08-19 DIAGNOSIS — I739 Peripheral vascular disease, unspecified: Secondary | ICD-10-CM | POA: Diagnosis not present

## 2023-08-19 DIAGNOSIS — T24311D Burn of third degree of right thigh, subsequent encounter: Secondary | ICD-10-CM | POA: Diagnosis not present

## 2023-08-19 DIAGNOSIS — Z79899 Other long term (current) drug therapy: Secondary | ICD-10-CM | POA: Diagnosis not present

## 2023-08-19 DIAGNOSIS — I2510 Atherosclerotic heart disease of native coronary artery without angina pectoris: Secondary | ICD-10-CM | POA: Diagnosis not present

## 2023-08-19 DIAGNOSIS — E785 Hyperlipidemia, unspecified: Secondary | ICD-10-CM | POA: Diagnosis not present

## 2023-08-19 DIAGNOSIS — M1990 Unspecified osteoarthritis, unspecified site: Secondary | ICD-10-CM | POA: Diagnosis not present

## 2023-08-19 DIAGNOSIS — Z7902 Long term (current) use of antithrombotics/antiplatelets: Secondary | ICD-10-CM | POA: Diagnosis not present

## 2023-08-19 DIAGNOSIS — E440 Moderate protein-calorie malnutrition: Secondary | ICD-10-CM | POA: Diagnosis not present

## 2023-08-19 DIAGNOSIS — I69351 Hemiplegia and hemiparesis following cerebral infarction affecting right dominant side: Secondary | ICD-10-CM | POA: Diagnosis not present

## 2023-08-19 DIAGNOSIS — T25321D Burn of third degree of right foot, subsequent encounter: Secondary | ICD-10-CM | POA: Diagnosis not present

## 2023-08-19 DIAGNOSIS — Z86718 Personal history of other venous thrombosis and embolism: Secondary | ICD-10-CM | POA: Diagnosis not present

## 2023-08-19 DIAGNOSIS — I5020 Unspecified systolic (congestive) heart failure: Secondary | ICD-10-CM | POA: Diagnosis not present

## 2023-08-19 DIAGNOSIS — Z9181 History of falling: Secondary | ICD-10-CM | POA: Diagnosis not present

## 2023-08-19 DIAGNOSIS — T25331D Burn of third degree of right toe(s) (nail), subsequent encounter: Secondary | ICD-10-CM | POA: Diagnosis not present

## 2023-08-19 DIAGNOSIS — S12100D Unspecified displaced fracture of second cervical vertebra, subsequent encounter for fracture with routine healing: Secondary | ICD-10-CM | POA: Diagnosis not present

## 2023-08-20 DIAGNOSIS — R001 Bradycardia, unspecified: Secondary | ICD-10-CM | POA: Diagnosis not present

## 2023-08-23 DIAGNOSIS — I110 Hypertensive heart disease with heart failure: Secondary | ICD-10-CM | POA: Diagnosis not present

## 2023-08-23 DIAGNOSIS — L03115 Cellulitis of right lower limb: Secondary | ICD-10-CM | POA: Diagnosis not present

## 2023-08-23 DIAGNOSIS — T24311D Burn of third degree of right thigh, subsequent encounter: Secondary | ICD-10-CM | POA: Diagnosis not present

## 2023-08-23 DIAGNOSIS — T25321D Burn of third degree of right foot, subsequent encounter: Secondary | ICD-10-CM | POA: Diagnosis not present

## 2023-08-23 DIAGNOSIS — I5020 Unspecified systolic (congestive) heart failure: Secondary | ICD-10-CM | POA: Diagnosis not present

## 2023-08-23 DIAGNOSIS — T25331D Burn of third degree of right toe(s) (nail), subsequent encounter: Secondary | ICD-10-CM | POA: Diagnosis not present

## 2023-08-23 DIAGNOSIS — S12100D Unspecified displaced fracture of second cervical vertebra, subsequent encounter for fracture with routine healing: Secondary | ICD-10-CM | POA: Diagnosis not present

## 2023-08-25 DIAGNOSIS — S12100D Unspecified displaced fracture of second cervical vertebra, subsequent encounter for fracture with routine healing: Secondary | ICD-10-CM | POA: Diagnosis not present

## 2023-08-25 DIAGNOSIS — T24311D Burn of third degree of right thigh, subsequent encounter: Secondary | ICD-10-CM | POA: Diagnosis not present

## 2023-08-25 DIAGNOSIS — T25321D Burn of third degree of right foot, subsequent encounter: Secondary | ICD-10-CM | POA: Diagnosis not present

## 2023-08-25 DIAGNOSIS — T25331D Burn of third degree of right toe(s) (nail), subsequent encounter: Secondary | ICD-10-CM | POA: Diagnosis not present

## 2023-08-25 DIAGNOSIS — I5020 Unspecified systolic (congestive) heart failure: Secondary | ICD-10-CM | POA: Diagnosis not present

## 2023-08-25 DIAGNOSIS — I110 Hypertensive heart disease with heart failure: Secondary | ICD-10-CM | POA: Diagnosis not present

## 2023-08-26 DIAGNOSIS — T24301D Burn of third degree of unspecified site of right lower limb, except ankle and foot, subsequent encounter: Secondary | ICD-10-CM | POA: Diagnosis not present

## 2023-08-26 DIAGNOSIS — T25321D Burn of third degree of right foot, subsequent encounter: Secondary | ICD-10-CM | POA: Diagnosis not present

## 2023-08-26 DIAGNOSIS — T310 Burns involving less than 10% of body surface: Secondary | ICD-10-CM | POA: Diagnosis not present

## 2023-08-26 DIAGNOSIS — T24311A Burn of third degree of right thigh, initial encounter: Secondary | ICD-10-CM | POA: Diagnosis not present

## 2023-08-26 DIAGNOSIS — I5020 Unspecified systolic (congestive) heart failure: Secondary | ICD-10-CM | POA: Diagnosis not present

## 2023-08-26 DIAGNOSIS — T25321A Burn of third degree of right foot, initial encounter: Secondary | ICD-10-CM | POA: Diagnosis not present

## 2023-08-30 DIAGNOSIS — T24311D Burn of third degree of right thigh, subsequent encounter: Secondary | ICD-10-CM | POA: Diagnosis not present

## 2023-08-30 DIAGNOSIS — I110 Hypertensive heart disease with heart failure: Secondary | ICD-10-CM | POA: Diagnosis not present

## 2023-08-30 DIAGNOSIS — T25331D Burn of third degree of right toe(s) (nail), subsequent encounter: Secondary | ICD-10-CM | POA: Diagnosis not present

## 2023-08-30 DIAGNOSIS — S12100D Unspecified displaced fracture of second cervical vertebra, subsequent encounter for fracture with routine healing: Secondary | ICD-10-CM | POA: Diagnosis not present

## 2023-08-30 DIAGNOSIS — T25321D Burn of third degree of right foot, subsequent encounter: Secondary | ICD-10-CM | POA: Diagnosis not present

## 2023-08-30 DIAGNOSIS — I5020 Unspecified systolic (congestive) heart failure: Secondary | ICD-10-CM | POA: Diagnosis not present

## 2023-08-31 ENCOUNTER — Telehealth: Payer: Self-pay | Admitting: *Deleted

## 2023-08-31 DIAGNOSIS — I5020 Unspecified systolic (congestive) heart failure: Secondary | ICD-10-CM | POA: Diagnosis not present

## 2023-08-31 DIAGNOSIS — T25321D Burn of third degree of right foot, subsequent encounter: Secondary | ICD-10-CM | POA: Diagnosis not present

## 2023-08-31 DIAGNOSIS — S12100D Unspecified displaced fracture of second cervical vertebra, subsequent encounter for fracture with routine healing: Secondary | ICD-10-CM | POA: Diagnosis not present

## 2023-08-31 DIAGNOSIS — T24311D Burn of third degree of right thigh, subsequent encounter: Secondary | ICD-10-CM | POA: Diagnosis not present

## 2023-08-31 DIAGNOSIS — I110 Hypertensive heart disease with heart failure: Secondary | ICD-10-CM | POA: Diagnosis not present

## 2023-08-31 DIAGNOSIS — T25331D Burn of third degree of right toe(s) (nail), subsequent encounter: Secondary | ICD-10-CM | POA: Diagnosis not present

## 2023-08-31 NOTE — Telephone Encounter (Signed)
Contacted to advise of restarting bisoprolol 2.5 mg. Per patient and girlfriend, patient restarted bisoprolol at 5 mg on 08/02/2023 when she started helping him with medications. Medication profile updated and advised to take all medications to visit tomorrow for review. Verbalized understanding.

## 2023-08-31 NOTE — Telephone Encounter (Signed)
-----   Message from Nona Dell sent at 08/30/2023 10:01 AM EDT ----- Let's try him back on bisoprolol 2.5 mg daily.  If there is an obvious concerning decrease in heart rate that is persistent, would have him come in for an ECG before making any other medication changes. ----- Message ----- From: Eustace Moore, RN Sent: 08/30/2023   9:42 AM EDT To: Jonelle Sidle, MD  He is continuing his care with [facility]. Please advise regarding beta blocker

## 2023-09-01 ENCOUNTER — Telehealth: Payer: Self-pay | Admitting: Cardiology

## 2023-09-01 ENCOUNTER — Ambulatory Visit: Payer: Medicare Other | Attending: Student | Admitting: Student

## 2023-09-01 ENCOUNTER — Other Ambulatory Visit (HOSPITAL_COMMUNITY)
Admission: RE | Admit: 2023-09-01 | Discharge: 2023-09-01 | Disposition: A | Discharge status: Home / Self Care | Payer: Medicare Other | Source: Ambulatory Visit | Attending: Student | Admitting: Student

## 2023-09-01 ENCOUNTER — Encounter: Payer: Self-pay | Admitting: Student

## 2023-09-01 VITALS — BP 132/70 | HR 66 | Ht 75.0 in | Wt 270.0 lb

## 2023-09-01 DIAGNOSIS — I491 Atrial premature depolarization: Secondary | ICD-10-CM | POA: Insufficient documentation

## 2023-09-01 DIAGNOSIS — I5020 Unspecified systolic (congestive) heart failure: Secondary | ICD-10-CM

## 2023-09-01 DIAGNOSIS — Z79899 Other long term (current) drug therapy: Secondary | ICD-10-CM

## 2023-09-01 DIAGNOSIS — I2510 Atherosclerotic heart disease of native coronary artery without angina pectoris: Secondary | ICD-10-CM | POA: Insufficient documentation

## 2023-09-01 DIAGNOSIS — E785 Hyperlipidemia, unspecified: Secondary | ICD-10-CM | POA: Insufficient documentation

## 2023-09-01 DIAGNOSIS — I10 Essential (primary) hypertension: Secondary | ICD-10-CM | POA: Diagnosis not present

## 2023-09-01 MED ORDER — ENTRESTO 49-51 MG PO TABS: 1.0000 | ORAL_TABLET | Freq: Two times a day (BID) | ORAL | 3 refills | Status: DC

## 2023-09-01 MED ORDER — EMPAGLIFLOZIN 10 MG PO TABS: 10.0000 mg | ORAL_TABLET | Freq: Every day | ORAL | 3 refills | Status: DC

## 2023-09-01 MED ORDER — BISOPROLOL FUMARATE 5 MG PO TABS: 5.0000 mg | ORAL_TABLET | Freq: Every day | ORAL | 3 refills | Status: DC

## 2023-09-01 NOTE — Telephone Encounter (Signed)
Lab states sample was hemolyzed, needs a re-draw, patient notified that lab is open tomorrow at 8 am.

## 2023-09-01 NOTE — Patient Instructions (Signed)
Medication Instructions:  Your physician recommends that you continue on your current medications as directed. Please refer to the Current Medication list given to you today.  *If you need a refill on your cardiac medications before your next appointment, please call your pharmacy*   Lab Work: Your physician recommends that you return for lab work today. BMET   If you have labs (blood work) drawn today and your tests are completely normal, you will receive your results only by: MyChart Message (if you have MyChart) OR A paper copy in the mail If you have any lab test that is abnormal or we need to change your treatment, we will call you to review the results.   Testing/Procedures: NONE     Follow-Up: At [facility], you and your health needs are our priority.  As part of our continuing mission to provide you with exceptional heart care, we have created designated Provider Care Teams.  These Care Teams include your primary Cardiologist (physician) and Advanced Practice Providers (APPs -  Physician Assistants and Nurse Practitioners) who all work together to provide you with the care you need, when you need it.  We recommend signing up for the patient portal called "MyChart".  Sign up information is provided on this After Visit Summary.  MyChart is used to connect with patients for Virtual Visits (Telemedicine).  Patients are able to view lab/test results, encounter notes, upcoming appointments, etc.  Non-urgent messages can be sent to your provider as well.   To learn more about what you can do with MyChart, go to [email or web address].    Your next appointment:    October   Provider:   Nona Dell, MD or Mcknight An, PA-C    Other Instructions Thank you for choosing [facility]!

## 2023-09-01 NOTE — Telephone Encounter (Signed)
Asking that the nurse gives him a call back. Please advise
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
## 2023-09-03 DIAGNOSIS — I5020 Unspecified systolic (congestive) heart failure: Secondary | ICD-10-CM | POA: Diagnosis not present

## 2023-09-03 DIAGNOSIS — T24311D Burn of third degree of right thigh, subsequent encounter: Secondary | ICD-10-CM | POA: Diagnosis not present

## 2023-09-03 DIAGNOSIS — S12100D Unspecified displaced fracture of second cervical vertebra, subsequent encounter for fracture with routine healing: Secondary | ICD-10-CM | POA: Diagnosis not present

## 2023-09-03 DIAGNOSIS — I110 Hypertensive heart disease with heart failure: Secondary | ICD-10-CM | POA: Diagnosis not present

## 2023-09-03 DIAGNOSIS — T25331D Burn of third degree of right toe(s) (nail), subsequent encounter: Secondary | ICD-10-CM | POA: Diagnosis not present

## 2023-09-03 DIAGNOSIS — T25321D Burn of third degree of right foot, subsequent encounter: Secondary | ICD-10-CM | POA: Diagnosis not present

## 2023-09-06 DIAGNOSIS — L03115 Cellulitis of right lower limb: Secondary | ICD-10-CM | POA: Diagnosis not present

## 2023-09-06 DIAGNOSIS — Z23 Encounter for immunization: Secondary | ICD-10-CM | POA: Diagnosis not present

## 2023-09-06 DIAGNOSIS — T25321D Burn of third degree of right foot, subsequent encounter: Secondary | ICD-10-CM | POA: Diagnosis not present

## 2023-09-06 DIAGNOSIS — I110 Hypertensive heart disease with heart failure: Secondary | ICD-10-CM | POA: Diagnosis not present

## 2023-09-06 DIAGNOSIS — T25331D Burn of third degree of right toe(s) (nail), subsequent encounter: Secondary | ICD-10-CM | POA: Diagnosis not present

## 2023-09-06 DIAGNOSIS — S12100D Unspecified displaced fracture of second cervical vertebra, subsequent encounter for fracture with routine healing: Secondary | ICD-10-CM | POA: Diagnosis not present

## 2023-09-06 DIAGNOSIS — T24311D Burn of third degree of right thigh, subsequent encounter: Secondary | ICD-10-CM | POA: Diagnosis not present

## 2023-09-06 DIAGNOSIS — I5020 Unspecified systolic (congestive) heart failure: Secondary | ICD-10-CM | POA: Diagnosis not present

## 2023-09-07 DIAGNOSIS — T25321D Burn of third degree of right foot, subsequent encounter: Secondary | ICD-10-CM | POA: Diagnosis not present

## 2023-09-07 DIAGNOSIS — T24311D Burn of third degree of right thigh, subsequent encounter: Secondary | ICD-10-CM | POA: Diagnosis not present

## 2023-09-07 DIAGNOSIS — I5020 Unspecified systolic (congestive) heart failure: Secondary | ICD-10-CM | POA: Diagnosis not present

## 2023-09-07 DIAGNOSIS — I110 Hypertensive heart disease with heart failure: Secondary | ICD-10-CM | POA: Diagnosis not present

## 2023-09-07 DIAGNOSIS — T25331D Burn of third degree of right toe(s) (nail), subsequent encounter: Secondary | ICD-10-CM | POA: Diagnosis not present

## 2023-09-07 DIAGNOSIS — S12100D Unspecified displaced fracture of second cervical vertebra, subsequent encounter for fracture with routine healing: Secondary | ICD-10-CM | POA: Diagnosis not present

## 2023-09-08 DIAGNOSIS — T24311D Burn of third degree of right thigh, subsequent encounter: Secondary | ICD-10-CM | POA: Diagnosis not present

## 2023-09-08 DIAGNOSIS — T25331D Burn of third degree of right toe(s) (nail), subsequent encounter: Secondary | ICD-10-CM | POA: Diagnosis not present

## 2023-09-08 DIAGNOSIS — I110 Hypertensive heart disease with heart failure: Secondary | ICD-10-CM | POA: Diagnosis not present

## 2023-09-08 DIAGNOSIS — I5020 Unspecified systolic (congestive) heart failure: Secondary | ICD-10-CM | POA: Diagnosis not present

## 2023-09-08 DIAGNOSIS — S12100D Unspecified displaced fracture of second cervical vertebra, subsequent encounter for fracture with routine healing: Secondary | ICD-10-CM | POA: Diagnosis not present

## 2023-09-08 DIAGNOSIS — T25321D Burn of third degree of right foot, subsequent encounter: Secondary | ICD-10-CM | POA: Diagnosis not present

## 2023-09-09 DIAGNOSIS — S12100D Unspecified displaced fracture of second cervical vertebra, subsequent encounter for fracture with routine healing: Secondary | ICD-10-CM | POA: Diagnosis not present

## 2023-09-12 DIAGNOSIS — T24301D Burn of third degree of unspecified site of right lower limb, except ankle and foot, subsequent encounter: Secondary | ICD-10-CM | POA: Diagnosis not present

## 2023-09-12 DIAGNOSIS — T24311D Burn of third degree of right thigh, subsequent encounter: Secondary | ICD-10-CM | POA: Diagnosis not present

## 2023-09-12 DIAGNOSIS — T24311A Burn of third degree of right thigh, initial encounter: Secondary | ICD-10-CM | POA: Diagnosis not present

## 2023-09-12 DIAGNOSIS — T25311D Burn of third degree of right ankle, subsequent encounter: Secondary | ICD-10-CM | POA: Diagnosis not present

## 2023-09-12 DIAGNOSIS — T310 Burns involving less than 10% of body surface: Secondary | ICD-10-CM | POA: Diagnosis not present

## 2023-09-12 DIAGNOSIS — I5020 Unspecified systolic (congestive) heart failure: Secondary | ICD-10-CM | POA: Diagnosis not present

## 2023-09-12 DIAGNOSIS — T25321A Burn of third degree of right foot, initial encounter: Secondary | ICD-10-CM | POA: Diagnosis not present

## 2023-09-12 DIAGNOSIS — Z993 Dependence on wheelchair: Secondary | ICD-10-CM | POA: Diagnosis not present

## 2023-09-12 DIAGNOSIS — T25321D Burn of third degree of right foot, subsequent encounter: Secondary | ICD-10-CM | POA: Diagnosis not present

## 2023-09-13 ENCOUNTER — Telehealth: Payer: Self-pay | Admitting: Cardiology

## 2023-09-13 DIAGNOSIS — I110 Hypertensive heart disease with heart failure: Secondary | ICD-10-CM | POA: Diagnosis not present

## 2023-09-13 DIAGNOSIS — S12100D Unspecified displaced fracture of second cervical vertebra, subsequent encounter for fracture with routine healing: Secondary | ICD-10-CM | POA: Diagnosis not present

## 2023-09-13 DIAGNOSIS — T24311D Burn of third degree of right thigh, subsequent encounter: Secondary | ICD-10-CM | POA: Diagnosis not present

## 2023-09-13 DIAGNOSIS — I5020 Unspecified systolic (congestive) heart failure: Secondary | ICD-10-CM | POA: Diagnosis not present

## 2023-09-13 DIAGNOSIS — T25331D Burn of third degree of right toe(s) (nail), subsequent encounter: Secondary | ICD-10-CM | POA: Diagnosis not present

## 2023-09-13 DIAGNOSIS — T25321D Burn of third degree of right foot, subsequent encounter: Secondary | ICD-10-CM | POA: Diagnosis not present

## 2023-09-13 NOTE — Telephone Encounter (Signed)
Calling to get orders to change the parameters for patient. His HR has been 35-38

## 2023-09-14 NOTE — Telephone Encounter (Signed)
Returned call to [facility]. No answer. Voicemail full.

## 2023-09-15 DIAGNOSIS — I5020 Unspecified systolic (congestive) heart failure: Secondary | ICD-10-CM | POA: Diagnosis not present

## 2023-09-15 DIAGNOSIS — I110 Hypertensive heart disease with heart failure: Secondary | ICD-10-CM | POA: Diagnosis not present

## 2023-09-15 DIAGNOSIS — T25321D Burn of third degree of right foot, subsequent encounter: Secondary | ICD-10-CM | POA: Diagnosis not present

## 2023-09-15 DIAGNOSIS — T25331D Burn of third degree of right toe(s) (nail), subsequent encounter: Secondary | ICD-10-CM | POA: Diagnosis not present

## 2023-09-15 DIAGNOSIS — S12100D Unspecified displaced fracture of second cervical vertebra, subsequent encounter for fracture with routine healing: Secondary | ICD-10-CM | POA: Diagnosis not present

## 2023-09-15 DIAGNOSIS — T24311D Burn of third degree of right thigh, subsequent encounter: Secondary | ICD-10-CM | POA: Diagnosis not present

## 2023-09-16 DIAGNOSIS — I110 Hypertensive heart disease with heart failure: Secondary | ICD-10-CM | POA: Diagnosis not present

## 2023-09-16 DIAGNOSIS — T25321D Burn of third degree of right foot, subsequent encounter: Secondary | ICD-10-CM | POA: Diagnosis not present

## 2023-09-16 DIAGNOSIS — T24311D Burn of third degree of right thigh, subsequent encounter: Secondary | ICD-10-CM | POA: Diagnosis not present

## 2023-09-16 DIAGNOSIS — I5020 Unspecified systolic (congestive) heart failure: Secondary | ICD-10-CM | POA: Diagnosis not present

## 2023-09-16 DIAGNOSIS — S12100D Unspecified displaced fracture of second cervical vertebra, subsequent encounter for fracture with routine healing: Secondary | ICD-10-CM | POA: Diagnosis not present

## 2023-09-16 DIAGNOSIS — T25331D Burn of third degree of right toe(s) (nail), subsequent encounter: Secondary | ICD-10-CM | POA: Diagnosis not present

## 2023-09-16 NOTE — Telephone Encounter (Signed)
Left a message for patient to call office regarding pt.

## 2023-09-18 DIAGNOSIS — I110 Hypertensive heart disease with heart failure: Secondary | ICD-10-CM | POA: Diagnosis not present

## 2023-09-18 DIAGNOSIS — I69351 Hemiplegia and hemiparesis following cerebral infarction affecting right dominant side: Secondary | ICD-10-CM | POA: Diagnosis not present

## 2023-09-18 DIAGNOSIS — T25321D Burn of third degree of right foot, subsequent encounter: Secondary | ICD-10-CM | POA: Diagnosis not present

## 2023-09-18 DIAGNOSIS — Z86718 Personal history of other venous thrombosis and embolism: Secondary | ICD-10-CM | POA: Diagnosis not present

## 2023-09-18 DIAGNOSIS — Z9181 History of falling: Secondary | ICD-10-CM | POA: Diagnosis not present

## 2023-09-18 DIAGNOSIS — J45909 Unspecified asthma, uncomplicated: Secondary | ICD-10-CM | POA: Diagnosis not present

## 2023-09-18 DIAGNOSIS — I5020 Unspecified systolic (congestive) heart failure: Secondary | ICD-10-CM | POA: Diagnosis not present

## 2023-09-18 DIAGNOSIS — E785 Hyperlipidemia, unspecified: Secondary | ICD-10-CM | POA: Diagnosis not present

## 2023-09-18 DIAGNOSIS — I2510 Atherosclerotic heart disease of native coronary artery without angina pectoris: Secondary | ICD-10-CM | POA: Diagnosis not present

## 2023-09-18 DIAGNOSIS — Z7902 Long term (current) use of antithrombotics/antiplatelets: Secondary | ICD-10-CM | POA: Diagnosis not present

## 2023-09-18 DIAGNOSIS — M1990 Unspecified osteoarthritis, unspecified site: Secondary | ICD-10-CM | POA: Diagnosis not present

## 2023-09-18 DIAGNOSIS — E440 Moderate protein-calorie malnutrition: Secondary | ICD-10-CM | POA: Diagnosis not present

## 2023-09-18 DIAGNOSIS — T25331D Burn of third degree of right toe(s) (nail), subsequent encounter: Secondary | ICD-10-CM | POA: Diagnosis not present

## 2023-09-18 DIAGNOSIS — Z79899 Other long term (current) drug therapy: Secondary | ICD-10-CM | POA: Diagnosis not present

## 2023-09-18 DIAGNOSIS — G4733 Obstructive sleep apnea (adult) (pediatric): Secondary | ICD-10-CM | POA: Diagnosis not present

## 2023-09-18 DIAGNOSIS — T24311D Burn of third degree of right thigh, subsequent encounter: Secondary | ICD-10-CM | POA: Diagnosis not present

## 2023-09-18 DIAGNOSIS — I739 Peripheral vascular disease, unspecified: Secondary | ICD-10-CM | POA: Diagnosis not present

## 2023-09-18 DIAGNOSIS — S12100D Unspecified displaced fracture of second cervical vertebra, subsequent encounter for fracture with routine healing: Secondary | ICD-10-CM | POA: Diagnosis not present

## 2023-09-19 NOTE — Telephone Encounter (Signed)
Left a message for Cody Velez to call our office back.

## 2023-09-19 NOTE — Telephone Encounter (Signed)
Spoke to Cody Velez with pt's home care who stated that pt hr is consistently in the 30's. Cody Velez stated that she is having to call the office each time to report it. Cody Velez is asking to have parameters changed to reflect consistent hr so that home care does not have to call office regarding hr.   Please advise.

## 2023-09-19 NOTE — Telephone Encounter (Signed)
Spoke to [place] with home health who stated that if a pt's vitals are outside of parameters, vitals are rechecked with apical and precordial pulses to verify. Cody Velez stated after checking this pulse rates again, they are still coming up in the 30's.   Please advise.

## 2023-09-19 NOTE — Telephone Encounter (Signed)
Spoke to [place] who voiced understanding    Spoke to pt who verbalized understanding. Pt agreeable to come in the [place] office for ECG to verify heart rates. Pt scheduled 10/2 @ 4:15 pm.

## 2023-09-21 ENCOUNTER — Ambulatory Visit: Payer: Medicare Other | Attending: Cardiology

## 2023-09-21 VITALS — BP 120/74 | HR 71

## 2023-09-21 DIAGNOSIS — S12100D Unspecified displaced fracture of second cervical vertebra, subsequent encounter for fracture with routine healing: Secondary | ICD-10-CM | POA: Diagnosis not present

## 2023-09-21 DIAGNOSIS — T25331D Burn of third degree of right toe(s) (nail), subsequent encounter: Secondary | ICD-10-CM | POA: Diagnosis not present

## 2023-09-21 DIAGNOSIS — T25321D Burn of third degree of right foot, subsequent encounter: Secondary | ICD-10-CM | POA: Diagnosis not present

## 2023-09-21 DIAGNOSIS — R001 Bradycardia, unspecified: Secondary | ICD-10-CM | POA: Insufficient documentation

## 2023-09-21 DIAGNOSIS — I5020 Unspecified systolic (congestive) heart failure: Secondary | ICD-10-CM | POA: Diagnosis not present

## 2023-09-21 DIAGNOSIS — I110 Hypertensive heart disease with heart failure: Secondary | ICD-10-CM | POA: Diagnosis not present

## 2023-09-21 DIAGNOSIS — T24311D Burn of third degree of right thigh, subsequent encounter: Secondary | ICD-10-CM | POA: Diagnosis not present

## 2023-09-21 NOTE — Patient Instructions (Signed)
Please ask Home Health Nurse to take apical heart rate for 1 minute and avoid pulse oximetry to obtain heart rate.   Keep scheduled appointments for echo and follow up with B.Strader,PA-C

## 2023-09-21 NOTE — Progress Notes (Signed)
Nurse visit EKG, HR 71, reviewed by Dr.[facility]   Suggestion to [facility] to take apical HR for 1 minute and avoid the pulse oximetry for HR readings

## 2023-09-23 DIAGNOSIS — L03116 Cellulitis of left lower limb: Secondary | ICD-10-CM | POA: Diagnosis not present

## 2023-09-24 DIAGNOSIS — I110 Hypertensive heart disease with heart failure: Secondary | ICD-10-CM | POA: Diagnosis not present

## 2023-09-24 DIAGNOSIS — I5020 Unspecified systolic (congestive) heart failure: Secondary | ICD-10-CM | POA: Diagnosis not present

## 2023-09-24 DIAGNOSIS — S12100D Unspecified displaced fracture of second cervical vertebra, subsequent encounter for fracture with routine healing: Secondary | ICD-10-CM | POA: Diagnosis not present

## 2023-09-24 DIAGNOSIS — T25331D Burn of third degree of right toe(s) (nail), subsequent encounter: Secondary | ICD-10-CM | POA: Diagnosis not present

## 2023-09-24 DIAGNOSIS — T25321D Burn of third degree of right foot, subsequent encounter: Secondary | ICD-10-CM | POA: Diagnosis not present

## 2023-09-24 DIAGNOSIS — T24311D Burn of third degree of right thigh, subsequent encounter: Secondary | ICD-10-CM | POA: Diagnosis not present

## 2023-09-26 DIAGNOSIS — I5020 Unspecified systolic (congestive) heart failure: Secondary | ICD-10-CM | POA: Diagnosis not present

## 2023-09-26 DIAGNOSIS — T24211A Burn of second degree of right thigh, initial encounter: Secondary | ICD-10-CM | POA: Diagnosis not present

## 2023-09-26 DIAGNOSIS — Z993 Dependence on wheelchair: Secondary | ICD-10-CM | POA: Diagnosis not present

## 2023-09-26 DIAGNOSIS — T24301D Burn of third degree of unspecified site of right lower limb, except ankle and foot, subsequent encounter: Secondary | ICD-10-CM | POA: Diagnosis not present

## 2023-09-26 DIAGNOSIS — T310 Burns involving less than 10% of body surface: Secondary | ICD-10-CM | POA: Diagnosis not present

## 2023-09-26 DIAGNOSIS — T24311A Burn of third degree of right thigh, initial encounter: Secondary | ICD-10-CM | POA: Diagnosis not present

## 2023-09-26 DIAGNOSIS — T25321A Burn of third degree of right foot, initial encounter: Secondary | ICD-10-CM | POA: Diagnosis not present

## 2023-09-27 DIAGNOSIS — S12100D Unspecified displaced fracture of second cervical vertebra, subsequent encounter for fracture with routine healing: Secondary | ICD-10-CM | POA: Diagnosis not present

## 2023-09-27 DIAGNOSIS — I5020 Unspecified systolic (congestive) heart failure: Secondary | ICD-10-CM | POA: Diagnosis not present

## 2023-09-27 DIAGNOSIS — T25321D Burn of third degree of right foot, subsequent encounter: Secondary | ICD-10-CM | POA: Diagnosis not present

## 2023-09-27 DIAGNOSIS — T25331D Burn of third degree of right toe(s) (nail), subsequent encounter: Secondary | ICD-10-CM | POA: Diagnosis not present

## 2023-09-27 DIAGNOSIS — T24311D Burn of third degree of right thigh, subsequent encounter: Secondary | ICD-10-CM | POA: Diagnosis not present

## 2023-09-27 DIAGNOSIS — I110 Hypertensive heart disease with heart failure: Secondary | ICD-10-CM | POA: Diagnosis not present

## 2023-09-28 DIAGNOSIS — T24311D Burn of third degree of right thigh, subsequent encounter: Secondary | ICD-10-CM | POA: Diagnosis not present

## 2023-09-28 DIAGNOSIS — T25331D Burn of third degree of right toe(s) (nail), subsequent encounter: Secondary | ICD-10-CM | POA: Diagnosis not present

## 2023-09-28 DIAGNOSIS — S12100D Unspecified displaced fracture of second cervical vertebra, subsequent encounter for fracture with routine healing: Secondary | ICD-10-CM | POA: Diagnosis not present

## 2023-09-28 DIAGNOSIS — I5020 Unspecified systolic (congestive) heart failure: Secondary | ICD-10-CM | POA: Diagnosis not present

## 2023-09-28 DIAGNOSIS — I110 Hypertensive heart disease with heart failure: Secondary | ICD-10-CM | POA: Diagnosis not present

## 2023-09-28 DIAGNOSIS — T25321D Burn of third degree of right foot, subsequent encounter: Secondary | ICD-10-CM | POA: Diagnosis not present

## 2023-09-29 DIAGNOSIS — I110 Hypertensive heart disease with heart failure: Secondary | ICD-10-CM | POA: Diagnosis not present

## 2023-09-29 DIAGNOSIS — T24311D Burn of third degree of right thigh, subsequent encounter: Secondary | ICD-10-CM | POA: Diagnosis not present

## 2023-09-29 DIAGNOSIS — T25331D Burn of third degree of right toe(s) (nail), subsequent encounter: Secondary | ICD-10-CM | POA: Diagnosis not present

## 2023-09-29 DIAGNOSIS — I5020 Unspecified systolic (congestive) heart failure: Secondary | ICD-10-CM | POA: Diagnosis not present

## 2023-09-29 DIAGNOSIS — T25321D Burn of third degree of right foot, subsequent encounter: Secondary | ICD-10-CM | POA: Diagnosis not present

## 2023-09-29 DIAGNOSIS — S12100D Unspecified displaced fracture of second cervical vertebra, subsequent encounter for fracture with routine healing: Secondary | ICD-10-CM | POA: Diagnosis not present

## 2023-10-02 DIAGNOSIS — W19XXXA Unspecified fall, initial encounter: Secondary | ICD-10-CM | POA: Diagnosis not present

## 2023-10-02 DIAGNOSIS — R5381 Other malaise: Secondary | ICD-10-CM | POA: Diagnosis not present

## 2023-10-02 DIAGNOSIS — R531 Weakness: Secondary | ICD-10-CM | POA: Diagnosis not present

## 2023-10-03 ENCOUNTER — Ambulatory Visit: Payer: Medicare Other

## 2023-10-03 ENCOUNTER — Telehealth: Payer: Self-pay

## 2023-10-03 ENCOUNTER — Ambulatory Visit (HOSPITAL_COMMUNITY)
Admission: RE | Admit: 2023-10-03 | Discharge: 2023-10-03 | Disposition: A | Discharge status: Home / Self Care | Payer: Medicare Other | Source: Ambulatory Visit | Attending: Student | Admitting: Student

## 2023-10-03 ENCOUNTER — Ambulatory Visit
Admission: EM | Admit: 2023-10-03 | Discharge: 2023-10-03 | Disposition: A | Discharge status: Home / Self Care | Payer: Medicare Other | Attending: Family Medicine | Admitting: Family Medicine

## 2023-10-03 DIAGNOSIS — I5020 Unspecified systolic (congestive) heart failure: Secondary | ICD-10-CM | POA: Diagnosis not present

## 2023-10-03 DIAGNOSIS — S52502A Unspecified fracture of the lower end of left radius, initial encounter for closed fracture: Secondary | ICD-10-CM | POA: Diagnosis not present

## 2023-10-03 DIAGNOSIS — I2510 Atherosclerotic heart disease of native coronary artery without angina pectoris: Secondary | ICD-10-CM | POA: Insufficient documentation

## 2023-10-03 DIAGNOSIS — I428 Other cardiomyopathies: Secondary | ICD-10-CM

## 2023-10-03 DIAGNOSIS — M25532 Pain in left wrist: Secondary | ICD-10-CM | POA: Diagnosis not present

## 2023-10-03 DIAGNOSIS — I491 Atrial premature depolarization: Secondary | ICD-10-CM | POA: Diagnosis not present

## 2023-10-03 LAB — ECHOCARDIOGRAM COMPLETE
AR max vel: 3.07 cm2
AV Area VTI: 2.5 cm2
AV Area mean vel: 2.75 cm2
AV Mean grad: 5.1 mm[Hg]
AV Peak grad: 9 mm[Hg]
Ao pk vel: 1.5 m/s
Area-P 1/2: 2.6 cm2
Calc EF: 33.5 %
S' Lateral: 4 cm
Single Plane A2C EF: 22.1 %
Single Plane A4C EF: 42.8 %

## 2023-10-03 MED ORDER — HYDROCODONE-ACETAMINOPHEN 5-325 MG PO TABS: 1.0000 | ORAL_TABLET | Freq: Two times a day (BID) | ORAL | 0 refills | Status: DC | PRN

## 2023-10-03 NOTE — Telephone Encounter (Signed)
2 attempts to contact pt, reached out to patients daughter that is on his ROI informed her that patient needs to contact us for his x-ray results. Daughter verbalized understanding stating she would contact her father and have him call us at [phone]
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
## 2023-10-03 NOTE — Progress Notes (Signed)
  Echocardiogram 2D Echocardiogram has been performed.  Cody Velez 10/03/2023, 12:29 PM
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
## 2023-10-03 NOTE — Discharge Instructions (Signed)
We have placed a splint on your wrist to immobilize the area, I will call you when the radiologist comes back with the official read on your x-ray to discuss further about the plan.  I have sent in a small supply of pain medication to help additionally.

## 2023-10-03 NOTE — Telephone Encounter (Signed)
Daughter called on behalf of her father because his phone call wouldn't come through, pts daughter verified identity of herself and of patient, information was given to pt's daughter on behalf of her father. She is aware pt will need to go to Ortho as soon as possible for fracture.

## 2023-10-04 ENCOUNTER — Other Ambulatory Visit: Payer: Self-pay

## 2023-10-04 ENCOUNTER — Emergency Department (HOSPITAL_COMMUNITY): Payer: No Typology Code available for payment source

## 2023-10-04 ENCOUNTER — Inpatient Hospital Stay (HOSPITAL_COMMUNITY)
Admission: EM | Admit: 2023-10-04 | Discharge: 2023-10-07 | DRG: 071 | Disposition: A | Discharge status: Skilled Nursing Facility | Payer: No Typology Code available for payment source | Attending: Family Medicine | Admitting: Family Medicine

## 2023-10-04 ENCOUNTER — Other Ambulatory Visit (HOSPITAL_COMMUNITY): Payer: Self-pay | Admitting: Internal Medicine

## 2023-10-04 DIAGNOSIS — G4733 Obstructive sleep apnea (adult) (pediatric): Secondary | ICD-10-CM | POA: Diagnosis present

## 2023-10-04 DIAGNOSIS — Z825 Family history of asthma and other chronic lower respiratory diseases: Secondary | ICD-10-CM

## 2023-10-04 DIAGNOSIS — F03A0 Unspecified dementia, mild, without behavioral disturbance, psychotic disturbance, mood disturbance, and anxiety: Secondary | ICD-10-CM | POA: Diagnosis present

## 2023-10-04 DIAGNOSIS — F17220 Nicotine dependence, chewing tobacco, uncomplicated: Secondary | ICD-10-CM | POA: Diagnosis present

## 2023-10-04 DIAGNOSIS — D72829 Elevated white blood cell count, unspecified: Secondary | ICD-10-CM | POA: Insufficient documentation

## 2023-10-04 DIAGNOSIS — R0989 Other specified symptoms and signs involving the circulatory and respiratory systems: Secondary | ICD-10-CM | POA: Diagnosis not present

## 2023-10-04 DIAGNOSIS — Z993 Dependence on wheelchair: Secondary | ICD-10-CM

## 2023-10-04 DIAGNOSIS — Z8673 Personal history of transient ischemic attack (TIA), and cerebral infarction without residual deficits: Secondary | ICD-10-CM

## 2023-10-04 DIAGNOSIS — W19XXXA Unspecified fall, initial encounter: Secondary | ICD-10-CM | POA: Diagnosis not present

## 2023-10-04 DIAGNOSIS — Z8701 Personal history of pneumonia (recurrent): Secondary | ICD-10-CM

## 2023-10-04 DIAGNOSIS — R29810 Facial weakness: Secondary | ICD-10-CM | POA: Diagnosis present

## 2023-10-04 DIAGNOSIS — R9431 Abnormal electrocardiogram [ECG] [EKG]: Secondary | ICD-10-CM | POA: Insufficient documentation

## 2023-10-04 DIAGNOSIS — I5020 Unspecified systolic (congestive) heart failure: Secondary | ICD-10-CM | POA: Diagnosis not present

## 2023-10-04 DIAGNOSIS — I10 Essential (primary) hypertension: Secondary | ICD-10-CM | POA: Diagnosis present

## 2023-10-04 DIAGNOSIS — S52502A Unspecified fracture of the lower end of left radius, initial encounter for closed fracture: Secondary | ICD-10-CM | POA: Diagnosis not present

## 2023-10-04 DIAGNOSIS — I110 Hypertensive heart disease with heart failure: Secondary | ICD-10-CM | POA: Diagnosis not present

## 2023-10-04 DIAGNOSIS — M1990 Unspecified osteoarthritis, unspecified site: Secondary | ICD-10-CM | POA: Diagnosis present

## 2023-10-04 DIAGNOSIS — M549 Dorsalgia, unspecified: Secondary | ICD-10-CM | POA: Diagnosis present

## 2023-10-04 DIAGNOSIS — I5022 Chronic systolic (congestive) heart failure: Secondary | ICD-10-CM | POA: Insufficient documentation

## 2023-10-04 DIAGNOSIS — Z860100 Personal history of colon polyps, unspecified: Secondary | ICD-10-CM

## 2023-10-04 DIAGNOSIS — E669 Obesity, unspecified: Secondary | ICD-10-CM | POA: Diagnosis present

## 2023-10-04 DIAGNOSIS — Z803 Family history of malignant neoplasm of breast: Secondary | ICD-10-CM

## 2023-10-04 DIAGNOSIS — R531 Weakness: Principal | ICD-10-CM

## 2023-10-04 DIAGNOSIS — K219 Gastro-esophageal reflux disease without esophagitis: Secondary | ICD-10-CM | POA: Diagnosis present

## 2023-10-04 DIAGNOSIS — R29818 Other symptoms and signs involving the nervous system: Secondary | ICD-10-CM | POA: Diagnosis present

## 2023-10-04 DIAGNOSIS — Z888 Allergy status to other drugs, medicaments and biological substances status: Secondary | ICD-10-CM

## 2023-10-04 DIAGNOSIS — R4781 Slurred speech: Secondary | ICD-10-CM | POA: Diagnosis present

## 2023-10-04 DIAGNOSIS — Z801 Family history of malignant neoplasm of trachea, bronchus and lung: Secondary | ICD-10-CM

## 2023-10-04 DIAGNOSIS — Z79891 Long term (current) use of opiate analgesic: Secondary | ICD-10-CM

## 2023-10-04 DIAGNOSIS — R062 Wheezing: Secondary | ICD-10-CM

## 2023-10-04 DIAGNOSIS — M25532 Pain in left wrist: Secondary | ICD-10-CM | POA: Diagnosis not present

## 2023-10-04 DIAGNOSIS — T24311D Burn of third degree of right thigh, subsequent encounter: Secondary | ICD-10-CM | POA: Diagnosis not present

## 2023-10-04 DIAGNOSIS — Z7984 Long term (current) use of oral hypoglycemic drugs: Secondary | ICD-10-CM

## 2023-10-04 DIAGNOSIS — G8929 Other chronic pain: Secondary | ICD-10-CM | POA: Diagnosis present

## 2023-10-04 DIAGNOSIS — Z87442 Personal history of urinary calculi: Secondary | ICD-10-CM

## 2023-10-04 DIAGNOSIS — G9341 Metabolic encephalopathy: Secondary | ICD-10-CM | POA: Diagnosis not present

## 2023-10-04 DIAGNOSIS — Z6835 Body mass index (BMI) 35.0-35.9, adult: Secondary | ICD-10-CM

## 2023-10-04 DIAGNOSIS — E7800 Pure hypercholesterolemia, unspecified: Secondary | ICD-10-CM | POA: Diagnosis present

## 2023-10-04 DIAGNOSIS — H409 Unspecified glaucoma: Secondary | ICD-10-CM | POA: Diagnosis present

## 2023-10-04 DIAGNOSIS — Z800 Family history of malignant neoplasm of digestive organs: Secondary | ICD-10-CM

## 2023-10-04 DIAGNOSIS — Z79899 Other long term (current) drug therapy: Secondary | ICD-10-CM

## 2023-10-04 DIAGNOSIS — T25331D Burn of third degree of right toe(s) (nail), subsequent encounter: Secondary | ICD-10-CM | POA: Diagnosis not present

## 2023-10-04 DIAGNOSIS — S12100D Unspecified displaced fracture of second cervical vertebra, subsequent encounter for fracture with routine healing: Secondary | ICD-10-CM | POA: Diagnosis not present

## 2023-10-04 DIAGNOSIS — I517 Cardiomegaly: Secondary | ICD-10-CM | POA: Diagnosis not present

## 2023-10-04 DIAGNOSIS — F4310 Post-traumatic stress disorder, unspecified: Secondary | ICD-10-CM | POA: Diagnosis present

## 2023-10-04 DIAGNOSIS — Z8546 Personal history of malignant neoplasm of prostate: Secondary | ICD-10-CM

## 2023-10-04 DIAGNOSIS — I2510 Atherosclerotic heart disease of native coronary artery without angina pectoris: Secondary | ICD-10-CM | POA: Diagnosis present

## 2023-10-04 DIAGNOSIS — Z7902 Long term (current) use of antithrombotics/antiplatelets: Secondary | ICD-10-CM

## 2023-10-04 DIAGNOSIS — Z1152 Encounter for screening for COVID-19: Secondary | ICD-10-CM

## 2023-10-04 DIAGNOSIS — F17200 Nicotine dependence, unspecified, uncomplicated: Secondary | ICD-10-CM | POA: Diagnosis present

## 2023-10-04 DIAGNOSIS — Z823 Family history of stroke: Secondary | ICD-10-CM

## 2023-10-04 DIAGNOSIS — R4182 Altered mental status, unspecified: Secondary | ICD-10-CM | POA: Diagnosis not present

## 2023-10-04 DIAGNOSIS — T25321D Burn of third degree of right foot, subsequent encounter: Secondary | ICD-10-CM | POA: Diagnosis not present

## 2023-10-04 DIAGNOSIS — Z8249 Family history of ischemic heart disease and other diseases of the circulatory system: Secondary | ICD-10-CM

## 2023-10-04 LAB — CBC WITH DIFFERENTIAL/PLATELET
Abs Immature Granulocytes: 0.16 10*3/uL — ABNORMAL HIGH (ref 0.00–0.07)
Basophils Absolute: 0.1 10*3/uL (ref 0.0–0.1)
Basophils Relative: 1 %
Eosinophils Absolute: 0.1 10*3/uL (ref 0.0–0.5)
Eosinophils Relative: 1 %
HCT: 52.5 % — ABNORMAL HIGH (ref 39.0–52.0)
Hemoglobin: 15.3 g/dL (ref 13.0–17.0)
Immature Granulocytes: 1 %
Lymphocytes Relative: 5 %
Lymphs Abs: 0.7 10*3/uL (ref 0.7–4.0)
MCH: 25.4 pg — ABNORMAL LOW (ref 26.0–34.0)
MCHC: 29.1 g/dL — ABNORMAL LOW (ref 30.0–36.0)
MCV: 87.1 fL (ref 80.0–100.0)
Monocytes Absolute: 1.3 10*3/uL — ABNORMAL HIGH (ref 0.1–1.0)
Monocytes Relative: 10 %
Neutro Abs: 10.8 10*3/uL — ABNORMAL HIGH (ref 1.7–7.7)
Neutrophils Relative %: 82 %
Platelets: 576 10*3/uL — ABNORMAL HIGH (ref 150–400)
RBC: 6.03 MIL/uL — ABNORMAL HIGH (ref 4.22–5.81)
RDW: 18 % — ABNORMAL HIGH (ref 11.5–15.5)
WBC: 13.2 10*3/uL — ABNORMAL HIGH (ref 4.0–10.5)
nRBC: 0 % (ref 0.0–0.2)

## 2023-10-04 LAB — URINALYSIS, W/ REFLEX TO CULTURE (INFECTION SUSPECTED)
Bacteria, UA: NONE SEEN
Bilirubin Urine: NEGATIVE
Glucose, UA: >=500 mg/dL — AB
Ketones, ur: NEGATIVE mg/dL
Leukocytes,Ua: NEGATIVE
Nitrite: NEGATIVE
Protein, ur: NEGATIVE mg/dL
Specific Gravity, Urine: 1.02 (ref 1.005–1.030)
pH: 5 (ref 5.0–8.0)

## 2023-10-04 LAB — COMPREHENSIVE METABOLIC PANEL
ALT: 13 U/L (ref 0–44)
AST: 15 U/L (ref 15–41)
Albumin: 3.1 g/dL — ABNORMAL LOW (ref 3.5–5.0)
Alkaline Phosphatase: 69 U/L (ref 38–126)
Anion gap: 9 (ref 5–15)
BUN: 12 mg/dL (ref 8–23)
CO2: 23 mmol/L (ref 22–32)
Calcium: 8.7 mg/dL — ABNORMAL LOW (ref 8.9–10.3)
Chloride: 103 mmol/L (ref 98–111)
Creatinine, Ser: 1.18 mg/dL (ref 0.61–1.24)
GFR, Estimated: >60 mL/min (ref >60)
Glucose, Bld: 96 mg/dL (ref 70–99)
Potassium: 4.2 mmol/L (ref 3.5–5.1)
Sodium: 135 mmol/L (ref 135–145)
Total Bilirubin: 0.8 mg/dL (ref 0.3–1.2)
Total Protein: 7 g/dL (ref 6.5–8.1)

## 2023-10-04 LAB — BLOOD GAS, ARTERIAL
Acid-Base Excess: 0.3 mmol/L (ref 0.0–2.0)
Bicarbonate: 24.6 mmol/L (ref 20.0–28.0)
Drawn by: 27407
FIO2: 21 %
O2 Saturation: 95.6 %
Patient temperature: 37.1
pCO2 arterial: 38 mm[Hg] (ref 32–48)
pH, Arterial: 7.42 (ref 7.35–7.45)
pO2, Arterial: 69 mm[Hg] — ABNORMAL LOW (ref 83–108)

## 2023-10-04 LAB — ETHANOL: Alcohol, Ethyl (B): <10 mg/dL (ref <10)

## 2023-10-04 LAB — I-STAT CHEM 8, ED
BUN: 14 mg/dL (ref 8–23)
Calcium, Ion: 1.12 mmol/L — ABNORMAL LOW (ref 1.15–1.40)
Chloride: 103 mmol/L (ref 98–111)
Creatinine, Ser: 1.3 mg/dL — ABNORMAL HIGH (ref 0.61–1.24)
Glucose, Bld: 105 mg/dL — ABNORMAL HIGH (ref 70–99)
HCT: 56 % — ABNORMAL HIGH (ref 39.0–52.0)
Hemoglobin: 19 g/dL — ABNORMAL HIGH (ref 13.0–17.0)
Potassium: 5.2 mmol/L — ABNORMAL HIGH (ref 3.5–5.1)
Sodium: 138 mmol/L (ref 135–145)
TCO2: 26 mmol/L (ref 22–32)

## 2023-10-04 LAB — PROTIME-INR
INR: 1.1 (ref 0.8–1.2)
Prothrombin Time: 14.5 s (ref 11.4–15.2)

## 2023-10-04 LAB — RAPID URINE DRUG SCREEN, HOSP PERFORMED
Amphetamines: NOT DETECTED
Barbiturates: NOT DETECTED
Benzodiazepines: NOT DETECTED
Cocaine: NOT DETECTED
Opiates: POSITIVE — AB
Tetrahydrocannabinol: NOT DETECTED

## 2023-10-04 LAB — APTT: aPTT: 28 s (ref 24–36)

## 2023-10-04 LAB — SARS CORONAVIRUS 2 BY RT PCR: SARS Coronavirus 2 by RT PCR: NEGATIVE

## 2023-10-04 LAB — LACTIC ACID, PLASMA: Lactic Acid, Venous: 1.4 mmol/L (ref 0.5–1.9)

## 2023-10-04 LAB — AMMONIA: Ammonia: 21 umol/L (ref 9–35)

## 2023-10-04 MED ORDER — SODIUM CHLORIDE 0.9 % IV BOLUS
500.0000 mL | Freq: Once | INTRAVENOUS | Status: AC
  Administered 2023-10-04: 500 mL via INTRAVENOUS

## 2023-10-04 MED ORDER — NALOXONE HCL 0.4 MG/ML IJ SOLN
0.4000 mg | Freq: Once | INTRAMUSCULAR | Status: AC
  Administered 2023-10-04: 0.4 mg via INTRAVENOUS
  Filled 2023-10-04: qty 1

## 2023-10-04 NOTE — ED Notes (Signed)
ED Provider at bedside. 

## 2023-10-04 NOTE — ED Notes (Signed)
Friend that lives with pt at bedside confirms pt's LKW is actually 0700 this morning.

## 2023-10-04 NOTE — ED Triage Notes (Addendum)
Pt brought in by [facility] from home with c/o generalized weakness, slurred speech, and leaning to the right side suddenly at 1330 today. Family reported to EMS that he fell yesterday and broke his left arm, but didn't hit his head. BP 112/80 for EMS. CODE STROKE activated by EMS.

## 2023-10-04 NOTE — Progress Notes (Addendum)
Telestroke cart was activated at [employer] for an EMS pre-arrival code stroke. EMS arrived at 1841. Per treatment team, pt's LKW was at 1330 with c/o L sided weakness/leaning to the right side and slurred speech. Dr. Criss Alvine, EDP at bedside assessing patient at time of EMS arrival. Pt transported to CT at 1842 and returned to room at 1911. [facility] was paged for code stroke at 1937 for EMS pre arrival. [facility] called Dr. Otelia Limes once when EMS arrived. Dr. Otelia Limes, [facility] appeared on telestroke cart at 1846 to assess the patient. Based on [facility]'s assessment, pt does not meet criteria for emergent interventions at this time 2/2 metabolic encephalopathy. P-mRS-4 noted on exam by [facility]. [facility] to f/u with EDP regarding recommendations. No further needs from Telestroke RN at this time. Telestroke cart disconnected at [employer].

## 2023-10-04 NOTE — ED Notes (Signed)
[facility] called CODE STROKE  @ 1834.  Beeped out to CT and Lab @ 1834.

## 2023-10-04 NOTE — ED Notes (Signed)
Pt brought back to ED room. Dr. Georg Ruddle on the camera assessing patient.

## 2023-10-04 NOTE — Consult Note (Signed)
[facility] TeleNeurology Consult Services    Date of Service:  10/04/2023      Metrics: Last Known Well: 0700 followed by confusion then further worsening at 1330 Symptoms: As per HPI.  Patient is not a candidate for thrombolytic. Overall presentation most consistent with diffuse weakness secondary to opiate medications in the setting of chronic deconditioning and possible metabolic encephalopathy.   Location of the provider: [facility]  Location of the patient: [facility] Pre-Morbid Modified Rankin Scale: 4 Time Code Stroke Page received:  6:37 PM (prior to patient arrival) Time neurologist arrived:  6:45 PM   This consult was provided via telemedicine with 2-way video and audio communication. The patient/family was informed that care would be provided in this way and agreed to receive care in this manner.   ED Physician notified of diagnostic impression and management plan.   Assessment: 77 year old male with an extensive PMHx, including CAD, dementia, HTN, HLD, OSA, PTSD, stroke in 2022 who presents to the ED via EMS as a Code Stroke after acute onset of left sided weakness, slurred speech and leaning to the right.   -Exam reveals findings most consistent with a toxic/metabolic encephalopathy. Has BUE asterixis, sedated affect and drowsiness as well as chronic BLE weakness. Subtle/equivocal decrease of left NL fold. NIHSS 9.  - Last Known Well: 0700 followed by confusion then further worsening at 1330 - CT head: No acute intracranial abnormality. - CT cervical spine: Question nondisplaced type 2 dens fracture. Recommend MRI for further evaluation. C3-C6 anterior cervical discectomy and fusion with no findings to suggest surgical hardware complication. - Patient is not a candidate for thrombolytic. Overall presentation most consistent with diffuse weakness secondary to opiate medications in the setting of chronic deconditioning and possible metabolic  encephalopathy. A small stroke is also possible, but felt to be unlikely.      Recommendations: - Reduce opiate dosing regimen gradually to optimize pain control while minimizing sedation - MRI brian to rule out a small stroke.  - Metabolic/infectious work up given asterixis on exam.  - Consider lowering his Neurontin dose as potential toxic levels can result in asterixis as well as sedation.       ------------------------------------------------------------------------------   History of Present Illness: 77 year old male with an extensive PMHx as documented below, including CAD, dementia, HTN, HLD, OSA, PTSD, stroke in 2022 who presents to the ED via EMS as a Code Stroke after acute onset of left sided weakness, slurred speech and leaning to the right. CBG per EMS was 130, BP 112/80, HR 90.      He has frequent falls at home for which EMS has been called multiple times in the past. Per his wife, he has had chronic BLE weakness since an infection due to a prior lumbar spine procedure.   He is on chronic opiate treatment with hydrocodone for chronic pain. He was started on a second opiate (oxycodone) yesterday for pain associated with left distal radial fracture sustained during a fall on the same day, for which he was also seen in the ED. He took both hydrocodone and oxycodone today, followed by onset of diffuse weakness and slurred speech. He is naive to the higher opiate dosing.   At baseline he uses a wheelchair at times, at others can walk with assistance using a walker.    Home medications also include Plavix and rosuvastatin.    Past Medical History: Past Medical History:  Diagnosis Date   Allergic rhinitis  Arthritis    B12 deficiency    Cervicogenic headache    Childhood asthma    Chronic back pain    Coronary atherosclerosis of native coronary artery    Mild nonobstructive CAD at catheterization January 2015   Dementia [facility])    Depression    Essential hypertension     Falls    GERD (gastroesophageal reflux disease)    Glaucoma    History of blood transfusion    History of cerebrovascular disease 07/23/2015   History of kidney stones    History of pneumonia 02/2011   Hyperlipidemia    OSA (obstructive sleep apnea)    CPAP - Dr. Shelle Iron - uses cpap every night   Pneumonia    Prostate cancer [facility])    PTSD (post-traumatic stress disorder)    [place]   Rectal bleeding    Stroke (HCC) 03/2021   Wheelchair bound       Past Surgical History: Past Surgical History:  Procedure Laterality Date   APPLICATION OF ROBOTIC ASSISTANCE FOR SPINAL PROCEDURE N/A 03/28/2018   Procedure: APPLICATION OF ROBOTIC ASSISTANCE FOR SPINAL PROCEDURE;  Surgeon: Barnett Abu, MD;  Location: [facility] OR;  Service: Neurosurgery;  Laterality: N/A;   BACK SURGERY  02/14/2012   lumbar OR #7; "today redid L1L2; replaced screws; added bone from hip"   BILATERAL KNEE ARTHROSCOPY     COLONOSCOPY  10/15/2008   Dr. Jena Gauss: tubular adenoma    COLONOSCOPY  12/17/2003   UJW:[id number] rectal and colon   COLONOSCOPY N/A 09/05/2014   tubular adenoma   COLONOSCOPY WITH PROPOFOL N/A 09/15/2022   Multiple 3-25 mm polyps in ascending colon and cecum, markedly elongated and redundant colon. One polyp removed piecemeal fashion. 6 month surveillance recommended. Tubular adenomas.   COLONOSCOPY WITH PROPOFOL N/A 02/23/2023   Procedure: COLONOSCOPY WITH PROPOFOL;  Surgeon: Corbin Ade, MD;  [facility]: [facility];  Service: Endoscopy;  Laterality: N/A;  730am, asa 3   CYSTOSCOPY WITH STENT PLACEMENT Right 01/27/2016   Procedure: CYSTOSCOPY WITH STENT PLACEMENT;  Surgeon: Marcine Matar, MD;  Location: [facility] ORS;  Service: Urology;  Laterality: Right;   CYSTOSCOPY/RETROGRADE/URETEROSCOPY/STONE EXTRACTION WITH BASKET Right 01/27/2016   Procedure: CYSTOSCOPY, RIGHT RETROGRADE, RIGHT URETEROSCOPY, STONE EXTRACTION ;  Surgeon: Marcine Matar, MD;  Location: [facility];  Service: Urology;  Laterality: Right;    ESOPHAGOGASTRODUODENOSCOPY  10/15/2008   Dr Rourk:Schatzki's ring status post dilation and disruption via 70 F Maloney dilator/ otherwise unremarkable esophagus, small hiatal hernia, multiple fundal gland polyps not manipulated, gastritis, negative H.pylori   ESOPHAGOGASTRODUODENOSCOPY  06/21/2002   WGN:FAOZH sliding hiatal hernia with mild changes of reflux esophagitis limited to gastroesophageal junction.  Noncritical ring at distal esophagus, 3 cm proximal to gastroesophageal junction/Antral gastritis   FACIAL COSMETIC SURGERY  ` 1985   "broke face playing softball"   FLEXIBLE SIGMOIDOSCOPY N/A 07/28/2022   Procedure: FLEXIBLE SIGMOIDOSCOPY;  Surgeon: Corbin Ade, MD;  [facility];  Service: Endoscopy;  Laterality: N/A;   FRACTURE SURGERY     "left wrist; broke it; took spur off"   HEMOSTASIS CLIP PLACEMENT  02/23/2023   Procedure: HEMOSTASIS CLIP PLACEMENT;  Surgeon: Corbin Ade, MD;  [facility];  Service: Endoscopy;;   HOLMIUM LASER APPLICATION Right 01/27/2016   Procedure: HOLMIUM LASER APPLICATION;  Surgeon: Marcine Matar, MD;  Location: [facility];  Service: Urology;  Laterality: Right;   KNEE ARTHROSCOPY Right 05/18/2018   Procedure: PARTIAL MEDIAL MENISECTOMY AND SURGICAL LAVAGE AND CHONDROPLASTY;  Surgeon: Teryl Lucy,  MD;  Location: [facility] OR;  Service: Orthopedics;  Laterality: Right;   LEFT HEART CATHETERIZATION WITH CORONARY ANGIOGRAM N/A 12/27/2013   Procedure: LEFT HEART CATHETERIZATION WITH CORONARY ANGIOGRAM;  Surgeon: Peter M [place], MD;  Location: [facility] CATH LAB;  Service: Cardiovascular;  Laterality: N/A;   neck epidural     POLYPECTOMY  09/15/2022   Procedure: POLYPECTOMY;  Surgeon: Corbin Ade, MD;  [facility];  Service: Endoscopy;;   POLYPECTOMY  02/23/2023   Procedure: POLYPECTOMY INTESTINAL;  Surgeon: Corbin Ade, MD;  [facility];  Service: Endoscopy;;   POSTERIOR LUMBAR FUSION 4 LEVEL N/A 03/28/2018    Procedure: Thoracic eight -Lumbar two- FIXATION WITH SCREW PLACEMENT, DECOMPRESSION Thoracic ten-Thoracic eleven  FOR OSTEOMYELITIS;  Surgeon: Barnett Abu, MD;  Location: [facility] OR;  Service: Neurosurgery;  Laterality: N/A;   SCLEROTHERAPY  09/15/2022   Procedure: SCLEROTHERAPY;  Surgeon: Corbin Ade, MD;  [facility];  Service: Endoscopy;;   SHOULDER SURGERY Bilateral    SPINAL CORD STIMULATOR REMOVAL N/A 02/25/2020   Procedure: LUMBAR SPINAL CORD STIMULATOR REMOVAL;  Surgeon: Barnett Abu, MD;  Location[facility] OR;  Service: Neurosurgery;  Laterality: N/A;   TEE WITHOUT CARDIOVERSION N/A 03/21/2018   Procedure: TRANSESOPHAGEAL ECHOCARDIOGRAM (TEE) WITH PROPOFOL;  Surgeon: Jonelle Sidle, MD;  [facility];  Service: Cardiovascular;  Laterality: N/A;       Medications:  No current facility-administered medications on file prior to encounter.   Current Outpatient Medications on File Prior to Encounter  Medication Sig Dispense Refill   alfuzosin (UROXATRAL) 10 MG 24 hr tablet Take 10 mg by mouth daily with breakfast.     ARIPiprazole (ABILIFY) 5 MG tablet Take 5 mg by mouth daily.     bisoprolol (ZEBETA) 5 MG tablet Take 1 tablet (5 mg total) by mouth daily. 90 tablet 3   cephALEXin (KEFLEX) 500 MG capsule Take 500 mg by mouth 3 (three) times daily.     Cholecalciferol (VITAMIN D) 50 MCG (2000 UT) CAPS Take 2,000 Units by mouth daily.     clopidogrel (PLAVIX) 75 MG tablet Take 1 tablet (75 mg total) by mouth daily. 30 tablet 2   cyanocobalamin (VITAMIN B12) 500 MCG tablet Take 500 mcg by mouth daily.     empagliflozin (JARDIANCE) 10 MG TABS tablet Take 1 tablet (10 mg total) by mouth daily. 90 tablet 3   finasteride (PROSCAR) 5 MG tablet Take 1 tablet (5 mg total) by mouth daily. Reported on 05/04/2016 30 tablet 0   fluticasone (FLONASE) 50 MCG/ACT nasal spray Place 1 spray into both nostrils daily as needed for allergies. 16 g 2   furosemide (LASIX) 20 MG tablet  Take 20 mg by mouth.     gabapentin (NEURONTIN) 400 MG capsule TAKE ONE CAPSULE (400MG  TOTAL) BY MOUTH TWO TIMES DAILY 180 capsule 3   HYDROcodone-acetaminophen (NORCO/VICODIN) 5-325 MG tablet Take 1 tablet by mouth 2 (two) times daily as needed for moderate pain (pain score 4-6). 10 tablet 0   Lifitegrast (XIIDRA) 5 % SOLN Place 1 drop into both eyes in the morning and at bedtime.     linaclotide (LINZESS) 290 MCG CAPS capsule Take 1 capsule (290 mcg total) by mouth daily before breakfast. 30 capsule 2   omeprazole (PRILOSEC) 20 MG capsule Take 2 capsules (40 mg total) by mouth daily. 60 capsule 4   oxyCODONE-acetaminophen (PERCOCET/ROXICET) 5-325 MG tablet Take 1 tablet by mouth 2 (two) times daily as needed for severe pain.  potassium chloride SA (KLOR-CON M) 20 MEQ tablet Take 20 mEq by mouth daily.     rosuvastatin (CRESTOR) 20 MG tablet Take 20 mg by mouth daily.     sacubitril-valsartan (ENTRESTO) 49-51 MG Take 1 tablet by mouth 2 (two) times daily. 180 tablet 3   spironolactone (ALDACTONE) 25 MG tablet Take 0.5 tablets by mouth daily.     traZODone (DESYREL) 100 MG tablet Take 1 tablet (100 mg total) by mouth at bedtime. 30 tablet 0   venlafaxine XR (EFFEXOR-XR) 150 MG 24 hr capsule Take 1 capsule (150 mg total) by mouth 2 (two) times daily. 60 capsule 0         Social History: Drug Use: No No EtOH use.    Family History:  Reviewed in Epic   ROS: As per HPI    Anticoagulant use:  No   Antiplatelet use: Yes, Plavix   Examination:      1A: Level of Consciousness - 1 (drowsy requiring frequent stimulation to attend) 1B: Ask Month and Age - 0 1C: Blink Eyes & Squeeze Hands - 0 2: Test Horizontal Extraocular Movements - 0 3: Test Visual Fields - 0 4: Test Facial Palsy (Use Grimace if Obtunded) - 1 (subtle decrease of left NL fold) 5A: Test Left Arm Motor Drift - 0 (requires more effort to keep LUE elevated, but no sustained drift. Bobbing motions of BUE are most  consistent with asterixis) 5B: Test Right Arm Motor Drift - 0 6A: Test Left Leg Motor Drift - 2 (chronic weakness) 6B: Test Right Leg Motor Drift - 2 (chronic weakness) 7: Test Limb Ataxia (FNF) - 0 8: Test Sensation -  1 9: Test Language/Aphasia - 1 (requires frequent stimulation to converse, resulting in fragmentary speech intermittently, but otherwise fluent) 10: Test Dysarthria - 1 (in the context of sedated affect) 11: Test Extinction/Inattention - Extinction to bilateral simultaneous stimulation 0   NIHSS Score: 9     Patient/Family was informed the Neurology Consult would occur via TeleHealth consult by way of interactive audio and video telecommunications and consented to receiving care in this manner.   Patient is being evaluated for possible acute neurologic impairment and high pretest probability of imminent or life-threatening deterioration. I spent total of 40 minutes providing care to this patient, including time for face to face visit via telemedicine, review of medical records, imaging studies and discussion of findings with providers, the patient and/or family.   Electronically signed: Dr. Caryl Pina

## 2023-10-04 NOTE — ED Provider Notes (Signed)
[place] EMERGENCY DEPARTMENT AT [facility] Provider Note   CSN: [id number] Arrival date & time: 10/04/23  1324  An emergency department physician performed an initial assessment on this suspected stroke patient at 1844.  History  Chief Complaint  Patient presents with   Code Stroke    Cody Velez is a 77 y.o. male.  HPI 77 year old male presents with strokelike symptoms.  History is from patient and EMS.  At around 1:30 PM family noticed that he was acting differently and seemed all over weak.  However he was also noted to be leaning to the right and having new slurred speech. Fell yesterday and broke his left wrist (seen at urgent care), but reportedly did not hit his head. He denies a headache.  Home Medications Prior to Admission medications   Medication Sig Start Date End Date Taking? Authorizing Provider  alfuzosin (UROXATRAL) 10 MG 24 hr tablet Take 10 mg by mouth daily with breakfast.   Yes [provider]  ARIPiprazole (ABILIFY) 5 MG tablet Take 5 mg by mouth daily.   Yes [provider]  bisoprolol (ZEBETA) 5 MG tablet Take 1 tablet (5 mg total) by mouth daily. 09/01/23  Yes Strader, Lennart Pall, PA-C  Cholecalciferol (VITAMIN D) 50 MCG (2000 UT) CAPS Take 2,000 Units by mouth daily.   Yes [provider]  clopidogrel (PLAVIX) 75 MG tablet Take 1 tablet (75 mg total) by mouth daily. 04/08/21  Yes Vanetta Mulders, MD  cyanocobalamin (VITAMIN B12) 500 MCG tablet Take 500 mcg by mouth daily.   Yes [provider]  empagliflozin (JARDIANCE) 10 MG TABS tablet Take 1 tablet (10 mg total) by mouth daily. 09/01/23  Yes Strader, [place] M, PA-C  finasteride (PROSCAR) 5 MG tablet Take 1 tablet (5 mg total) by mouth daily. Reported on 05/04/2016 05/22/18  Yes Osvaldo Shipper, MD  fluticasone [facility]) 50 MCG/ACT nasal spray Place 1 spray into both nostrils daily as needed for allergies. 05/22/18  Yes Osvaldo Shipper, MD  furosemide  (LASIX) 20 MG tablet Take 20 mg by mouth.   Yes [provider]  gabapentin (NEURONTIN) 400 MG capsule TAKE ONE CAPSULE (400MG  TOTAL) BY MOUTH TWO TIMES DAILY 03/08/23  Yes Glean Salvo, NP  HYDROcodone-acetaminophen (NORCO/VICODIN) 5-325 MG tablet Take 1 tablet by mouth 2 (two) times daily as needed for moderate pain (pain score 4-6). 10/03/23  Yes Particia Nearing, PA-C  mafenide (SULFAMYLON) cream Apply 1 Application topically every other day. 07/26/23  Yes [provider]  mupirocin ointment (BACTROBAN) 2 % Apply 1 Application topically daily. 09/21/23  Yes [provider]  omeprazole (PRILOSEC) 20 MG capsule Take 2 capsules (40 mg total) by mouth daily. 06/14/23  Yes Mahon, Frederik Schmidt, NP  Oxycodone HCl 10 MG TABS Take 1 tablet by mouth every 4 (four) hours as needed (pain). 08/15/23  Yes [provider]  potassium chloride SA (KLOR-CON M) 20 MEQ tablet Take 20 mEq by mouth daily.   Yes [provider]  rosuvastatin (CRESTOR) 20 MG tablet Take 20 mg by mouth daily.   Yes [provider]  sacubitril-valsartan (ENTRESTO) 49-51 MG Take 1 tablet by mouth 2 (two) times daily. 09/01/23  Yes Strader, [place] M, PA-C  spironolactone (ALDACTONE) 25 MG tablet Take 0.5 tablets by mouth daily. 07/20/23 10/04/23 Yes [provider]  traZODone (DESYREL) 100 MG tablet Take 1 tablet (100 mg total) by mouth at bedtime. 05/22/18  Yes Osvaldo Shipper, MD  venlafaxine XR (EFFEXOR-XR) 150  MG 24 hr capsule Take 1 capsule (150 mg total) by mouth 2 (two) times daily. 05/22/18  Yes Osvaldo Shipper, MD      Allergies    Lisinopril    Review of Systems   Review of Systems  Unable to perform ROS: Acuity of condition    Physical Exam Updated Vital Signs BP (!) 156/66   Pulse 86   Temp 98.4 F (36.9 C) (Oral)   Resp 19   Wt 128.8 kg   SpO2 95%   BMI 35.49 kg/m  Physical Exam Vitals and nursing note reviewed.  Constitutional:      Appearance: He  is well-developed. He is obese.  HENT:     Head: Normocephalic and atraumatic.  Cardiovascular:     Rate and Rhythm: Normal rate and regular rhythm.     Heart sounds: Normal heart sounds.  Pulmonary:     Effort: Pulmonary effort is normal.     Breath sounds: Normal breath sounds.  Abdominal:     Palpations: Abdomen is soft.     Tenderness: There is no abdominal tenderness.  Skin:    General: Skin is warm and dry.  Neurological:     Mental Status: He is alert and oriented to person, place, and time.     Comments: Awake, alert, oriented, but sleepy. Diffusely weak in all 4 extremities. Slurred speech but no facial droop. Appears to have asterixis.     ED Results / Procedures / Treatments   Labs (all labs ordered are listed, but only abnormal results are displayed) Labs Reviewed  RAPID URINE DRUG SCREEN, HOSP PERFORMED - Abnormal; Notable for the following components:      Result Value   Opiates POSITIVE (*)    All other components within normal limits  URINALYSIS, W/ REFLEX TO CULTURE (INFECTION SUSPECTED) - Abnormal; Notable for the following components:   Glucose, UA >=500 (*)    Hgb urine dipstick MODERATE (*)    All other components within normal limits  BLOOD GAS, ARTERIAL - Abnormal; Notable for the following components:   pO2, Arterial 69 (*)    All other components within normal limits  CBC WITH DIFFERENTIAL/PLATELET - Abnormal; Notable for the following components:   WBC 13.2 (*)    RBC 6.03 (*)    HCT 52.5 (*)    MCH 25.4 (*)    MCHC 29.1 (*)    RDW 18.0 (*)    Platelets 576 (*)    Neutro Abs 10.8 (*)    Monocytes Absolute 1.3 (*)    Abs Immature Granulocytes 0.16 (*)    All other components within normal limits  COMPREHENSIVE METABOLIC PANEL - Abnormal; Notable for the following components:   Calcium 8.7 (*)    Albumin 3.1 (*)    All other components within normal limits  I-STAT CHEM 8, ED - Abnormal; Notable for the following components:   Potassium 5.2  (*)    Creatinine, Ser 1.30 (*)    Glucose, Bld 105 (*)    Calcium, Ion 1.12 (*)    Hemoglobin 19.0 (*)    HCT 56.0 (*)    All other components within normal limits  SARS CORONAVIRUS 2 BY RT PCR  PROTIME-INR  APTT  AMMONIA  LACTIC ACID, PLASMA  ETHANOL  CBC WITH DIFFERENTIAL/PLATELET    EKG None  Radiology DG Chest Portable 1 View  Result Date: 10/04/2023 CLINICAL DATA:  Altered mental status EXAM: PORTABLE CHEST 1 VIEW COMPARISON:  04/25/2018 FINDINGS: Low lung volumes. Cardiomegaly  with vascular congestion. Enlarged mediastinal silhouette compared to prior likely due to patient rotation and portable technique. No pleural effusion or focal airspace opacity. Hardware in the cervical spine IMPRESSION: Low lung volumes. Cardiomegaly with vascular congestion. Electronically Signed   By: Jasmine Pang M.D.   On: 10/04/2023 21:56   CT HEAD CODE STROKE WO CONTRAST  Result Date: 10/04/2023 CLINICAL DATA:  Code stroke. Slurred speech, leading to right side, generalized weakness EXAM: CT HEAD WITHOUT CONTRAST TECHNIQUE: Contiguous axial images were obtained from the base of the skull through the vertex without intravenous contrast. RADIATION DOSE REDUCTION: This exam was performed according to the departmental dose-optimization program which includes automated exposure control, adjustment of the mA and/or kV according to patient size and/or use of iterative reconstruction technique. COMPARISON:  06/16/2023 FINDINGS: Brain: No evidence of acute infarction, hemorrhage, mass, mass effect, or midline shift. No hydrocephalus or extra-axial collection. Mildly advanced cerebral atrophy for age, with remote lacunar infarcts in the basal ganglia and pons. Periventricular white matter changes, likely the sequela of chronic small vessel ischemic disease. Vascular: No hyperdense vessel. Skull: Negative for fracture or focal lesion. Sinuses/Orbits: No acute finding. Other: The mastoid air cells are well  aerated. ASPECTS [facility] Stroke Program Early CT Score) - Ganglionic level infarction (caudate, lentiform nuclei, internal capsule, insula, M1-M3 cortex): 7 - Supraganglionic infarction (M4-M6 cortex): 3 Total score (0-10 with 10 being normal): 10 IMPRESSION: 1. No acute intracranial process. 2. ASPECTS is 10. Code stroke imaging results were communicated on 10/04/2023 at 7:05 pm to provider Mayer Vondrak via telephone, who verbally acknowledged these results. Electronically Signed   By: Wiliam Ke M.D.   On: 10/04/2023 19:05   DG Wrist Complete Left  Result Date: 10/03/2023 CLINICAL DATA:  Left wrist pain and swelling after fall this afternoon EXAM: LEFT WRIST - COMPLETE 3+ VIEW COMPARISON:  02/16/2019 FINDINGS: Mildly displaced distal radial fracture primarily involving the metaphysis. Possible radiocarpal involvement. No additional acute fracture. There is chondrocalcinosis in the region of the triangular fibrocartilage, chronic. Corticated density adjacent to the ulna styloid, chronic. Lateral view is limited due to positioning. Generalized soft tissue edema. IMPRESSION: Mildly displaced distal radial fracture primarily involving the metaphysis. Electronically Signed   By: Narda Rutherford M.D.   On: 10/03/2023 17:22   ECHOCARDIOGRAM COMPLETE  Result Date: 10/03/2023    ECHOCARDIOGRAM REPORT   Patient Name:   Cody Velez Date of Exam: 10/03/2023 Medical Rec #:  [id number]         Height:       75.0 in Accession #:    [id number]        Weight:       270.0 lb Date of Birth:  09-27-1946          BSA:          2.493 m Patient Age:    77 years          BP:           120/77 mmHg Patient Gender: M                 HR:           77 bpm. [facility] Procedure: 2D Echo, Cardiac Doppler and Color Doppler Indications:    I42.9 Cardiomyopathy (unspecified)  History:        Patient has prior history of Echocardiogram examinations, most  recent 03/21/2018. CHF, CAD, Abnormal ECG, Stroke,                  Arrythmias:Bradycardia, Signs/Symptoms:Bacteremia; Risk                 Factors:Hypertension, Sleep Apnea, Current Smoker and                 Dyslipidemia.  Sonographer:    Sheralyn Boatman RDCS Referring Phys: [id number] [facility]  Sonographer Comments: Technically difficult study due to poor echo windows, suboptimal parasternal window and patient is obese. Image acquisition challenging due to patient body habitus. Patient had mobility issues. Very difficult study. Had difficulty getting from chair to bed. Patient fell in parking lot before appointment. IMPRESSIONS  1. Left ventricular ejection fraction, by estimation, is 30 to 35%. The left ventricle has moderately decreased function. The left ventricle demonstrates global hypokinesis. There is moderate left ventricular hypertrophy. Left ventricular diastolic parameters are consistent with Grade I diastolic dysfunction (impaired relaxation).  2. Right ventricular systolic function is normal. The right ventricular size is normal. Tricuspid regurgitation signal is inadequate for assessing PA pressure.  3. The mitral valve was not well visualized. No evidence of mitral valve regurgitation. No evidence of mitral stenosis.  4. The aortic valve was not well visualized. Aortic valve regurgitation is not visualized. No aortic stenosis is present.  5. The inferior vena cava is normal in size with greater than 50% respiratory variability, suggesting right atrial pressure of 3 mmHg. FINDINGS  Left Ventricle: Left ventricular ejection fraction, by estimation, is 30 to 35%. The left ventricle has moderately decreased function. The left ventricle demonstrates global hypokinesis. The left ventricular internal cavity size was normal in size. There is moderate left ventricular hypertrophy. Left ventricular diastolic parameters are consistent with Grade I diastolic dysfunction (impaired relaxation). Indeterminate filling pressures. Right Ventricle: The right  ventricular size is normal. Right vetricular wall thickness was not well visualized. Right ventricular systolic function is normal. Tricuspid regurgitation signal is inadequate for assessing PA pressure. Left Atrium: Left atrial size was not well visualized. Right Atrium: Right atrial size was not well visualized. Pericardium: There is no evidence of pericardial effusion. Mitral Valve: The mitral valve was not well visualized. No evidence of mitral valve regurgitation. No evidence of mitral valve stenosis. Tricuspid Valve: The tricuspid valve is normal in structure. Tricuspid valve regurgitation is not demonstrated. No evidence of tricuspid stenosis. Aortic Valve: The aortic valve was not well visualized. Aortic valve regurgitation is not visualized. No aortic stenosis is present. Aortic valve mean gradient measures 5.1 mmHg. Aortic valve peak gradient measures 9.0 mmHg. Aortic valve area, by VTI measures 2.50 cm. Pulmonic Valve: The pulmonic valve was not well visualized. Pulmonic valve regurgitation is not visualized. No evidence of pulmonic stenosis. Aorta: The aortic root and ascending aorta are structurally normal, with no evidence of dilitation. Venous: The inferior vena cava is normal in size with greater than 50% respiratory variability, suggesting right atrial pressure of 3 mmHg. IAS/Shunts: The interatrial septum was not well visualized.  LEFT VENTRICLE PLAX 2D LVIDd:         4.60 cm LVIDs:         4.00 cm LV PW:         1.30 cm LV IVS:        1.40 cm LVOT diam:     2.30 cm LV SV:         81 LV SV Index:   32 LVOT Area:  4.15 cm  LV Volumes (MOD) LV vol d, MOD A2C: 106.0 ml LV vol d, MOD A4C: 126.0 ml LV vol s, MOD A2C: 82.6 ml LV vol s, MOD A4C: 72.1 ml LV SV MOD A2C:     23.4 ml LV SV MOD A4C:     126.0 ml LV SV MOD BP:      38.8 ml RIGHT VENTRICLE            IVC RV S prime:     6.96 cm/s  IVC diam: 2.00 cm TAPSE (M-mode): 1.5 cm LEFT ATRIUM           Index        RIGHT ATRIUM           Index LA  diam:      4.00 cm 1.60 cm/m   RA Area:     11.00 cm LA Vol (A2C): 46.3 ml 18.57 ml/m  RA Volume:   22.20 ml  8.91 ml/m LA Vol (A4C): 47.0 ml 18.85 ml/m  AORTIC VALVE AV Area (Vmax):    3.07 cm AV Area (Vmean):   2.75 cm AV Area (VTI):     2.50 cm AV Vmax:           149.61 cm/s AV Vmean:          106.716 cm/s AV VTI:            0.323 m AV Peak Grad:      9.0 mmHg AV Mean Grad:      5.1 mmHg LVOT Vmax:         110.50 cm/s LVOT Vmean:        70.700 cm/s LVOT VTI:          0.194 m LVOT/AV VTI ratio: 0.60  AORTA Ao Root diam: 3.80 cm Ao Asc diam:  3.60 cm MITRAL VALVE MV Area (PHT): 2.60 cm    SHUNTS MV Decel Time: 292 msec    Systemic VTI:  0.19 m MV E velocity: 37.60 cm/s  Systemic Diam: 2.30 cm MV A velocity: 79.10 cm/s MV E/A ratio:  0.48 Dina Rich MD Electronically signed by Dina Rich MD Signature Date/Time: 10/03/2023/12:41:59 PM    Final     Procedures Procedures    Medications Ordered in ED Medications  naloxone [facility]) injection 0.4 mg (0.4 mg Intravenous Given 10/04/23 1943)  sodium chloride 0.9 % bolus 500 mL (0 mLs Intravenous Stopped 10/04/23 2258)    ED Course/ Medical Decision Making/ A&P                                 Medical Decision Making Amount and/or Complexity of Data Reviewed Labs: ordered.    Details: Leukocytosis without clear sign of an infection.  Normal ammonia.  ABG without hypercarbia Radiology: ordered.    Details: No head bleed on CT.  No pneumonia. ECG/medicine tests: ordered and independent interpretation performed.    Details: No ischemia  Risk Prescription drug management. Decision regarding hospitalization.   Patient presents with acute generalized weakness.  Talking to significant other who arrived after the initial workup, it seems that his last known well was actually closer to 7 AM and has been weak all day.  However the slurred speech was an acute new thing this afternoon.  No focal deficits on exam.  Code stroke was  eventually canceled after neuroevaluation.  Seems more like a metabolic encephalopathy.  No clear findings  on workup so far.  He does not have any obvious sign of an infection on workup.  He is not altered though generally sleepy.  He was given a dose of Narcan as it seems like he had taken both hydrocodone earlier today and then oxycodone.  He was given hydrocodone in addition to his chronic oxycodone due to his recent fracture.  He was given Narcan but there was not much relief seen.  Given the slurred speech and his previous stroke history I think it would be reasonable to admit him for observation and then likely an MRI in the morning.  Discussed with Dr. Carren Rang for admission.        Final Clinical Impression(s) / ED Diagnoses Final diagnoses:  Generalized weakness    Rx / DC Orders ED Discharge Orders     None         Pricilla Loveless, MD 10/04/23 2335

## 2023-10-04 NOTE — ED Notes (Addendum)
Inserted a 16 French foley catheter. Drained 325 cc of clear, amber urine. Catheter removed. Pt. Tolerated well.

## 2023-10-05 ENCOUNTER — Encounter (HOSPITAL_COMMUNITY): Payer: Self-pay | Admitting: Family Medicine

## 2023-10-05 ENCOUNTER — Other Ambulatory Visit: Payer: Self-pay

## 2023-10-05 ENCOUNTER — Observation Stay (HOSPITAL_COMMUNITY): Payer: No Typology Code available for payment source

## 2023-10-05 DIAGNOSIS — R29810 Facial weakness: Secondary | ICD-10-CM | POA: Diagnosis present

## 2023-10-05 DIAGNOSIS — E66812 Obesity, class 2: Secondary | ICD-10-CM | POA: Diagnosis not present

## 2023-10-05 DIAGNOSIS — G4733 Obstructive sleep apnea (adult) (pediatric): Secondary | ICD-10-CM | POA: Diagnosis present

## 2023-10-05 DIAGNOSIS — Z1152 Encounter for screening for COVID-19: Secondary | ICD-10-CM | POA: Diagnosis not present

## 2023-10-05 DIAGNOSIS — D72829 Elevated white blood cell count, unspecified: Secondary | ICD-10-CM | POA: Insufficient documentation

## 2023-10-05 DIAGNOSIS — R531 Weakness: Secondary | ICD-10-CM | POA: Diagnosis not present

## 2023-10-05 DIAGNOSIS — I5022 Chronic systolic (congestive) heart failure: Secondary | ICD-10-CM | POA: Diagnosis not present

## 2023-10-05 DIAGNOSIS — I110 Hypertensive heart disease with heart failure: Secondary | ICD-10-CM | POA: Diagnosis not present

## 2023-10-05 DIAGNOSIS — F17200 Nicotine dependence, unspecified, uncomplicated: Secondary | ICD-10-CM | POA: Diagnosis not present

## 2023-10-05 DIAGNOSIS — F4310 Post-traumatic stress disorder, unspecified: Secondary | ICD-10-CM

## 2023-10-05 DIAGNOSIS — Z981 Arthrodesis status: Secondary | ICD-10-CM | POA: Diagnosis not present

## 2023-10-05 DIAGNOSIS — R9431 Abnormal electrocardiogram [ECG] [EKG]: Secondary | ICD-10-CM | POA: Insufficient documentation

## 2023-10-05 DIAGNOSIS — G9340 Encephalopathy, unspecified: Secondary | ICD-10-CM | POA: Diagnosis not present

## 2023-10-05 DIAGNOSIS — M549 Dorsalgia, unspecified: Secondary | ICD-10-CM | POA: Diagnosis present

## 2023-10-05 DIAGNOSIS — F03A0 Unspecified dementia, mild, without behavioral disturbance, psychotic disturbance, mood disturbance, and anxiety: Secondary | ICD-10-CM | POA: Diagnosis present

## 2023-10-05 DIAGNOSIS — E7800 Pure hypercholesterolemia, unspecified: Secondary | ICD-10-CM | POA: Diagnosis not present

## 2023-10-05 DIAGNOSIS — H409 Unspecified glaucoma: Secondary | ICD-10-CM | POA: Diagnosis present

## 2023-10-05 DIAGNOSIS — R29818 Other symptoms and signs involving the nervous system: Secondary | ICD-10-CM | POA: Diagnosis not present

## 2023-10-05 DIAGNOSIS — Z6835 Body mass index (BMI) 35.0-35.9, adult: Secondary | ICD-10-CM | POA: Diagnosis not present

## 2023-10-05 DIAGNOSIS — K219 Gastro-esophageal reflux disease without esophagitis: Secondary | ICD-10-CM

## 2023-10-05 DIAGNOSIS — I10 Essential (primary) hypertension: Secondary | ICD-10-CM

## 2023-10-05 DIAGNOSIS — R4781 Slurred speech: Secondary | ICD-10-CM | POA: Diagnosis present

## 2023-10-05 DIAGNOSIS — Z79891 Long term (current) use of opiate analgesic: Secondary | ICD-10-CM | POA: Diagnosis not present

## 2023-10-05 DIAGNOSIS — I2510 Atherosclerotic heart disease of native coronary artery without angina pectoris: Secondary | ICD-10-CM | POA: Diagnosis present

## 2023-10-05 DIAGNOSIS — Z993 Dependence on wheelchair: Secondary | ICD-10-CM | POA: Diagnosis not present

## 2023-10-05 DIAGNOSIS — G8220 Paraplegia, unspecified: Secondary | ICD-10-CM | POA: Diagnosis not present

## 2023-10-05 DIAGNOSIS — G8929 Other chronic pain: Secondary | ICD-10-CM | POA: Diagnosis present

## 2023-10-05 DIAGNOSIS — G319 Degenerative disease of nervous system, unspecified: Secondary | ICD-10-CM | POA: Diagnosis not present

## 2023-10-05 DIAGNOSIS — F17220 Nicotine dependence, chewing tobacco, uncomplicated: Secondary | ICD-10-CM | POA: Diagnosis present

## 2023-10-05 DIAGNOSIS — Z7984 Long term (current) use of oral hypoglycemic drugs: Secondary | ICD-10-CM | POA: Diagnosis not present

## 2023-10-05 DIAGNOSIS — S62102D Fracture of unspecified carpal bone, left wrist, subsequent encounter for fracture with routine healing: Secondary | ICD-10-CM | POA: Diagnosis not present

## 2023-10-05 DIAGNOSIS — Z8249 Family history of ischemic heart disease and other diseases of the circulatory system: Secondary | ICD-10-CM | POA: Diagnosis not present

## 2023-10-05 DIAGNOSIS — G9341 Metabolic encephalopathy: Secondary | ICD-10-CM

## 2023-10-05 DIAGNOSIS — E669 Obesity, unspecified: Secondary | ICD-10-CM | POA: Diagnosis present

## 2023-10-05 DIAGNOSIS — M1990 Unspecified osteoarthritis, unspecified site: Secondary | ICD-10-CM | POA: Diagnosis present

## 2023-10-05 LAB — COMPREHENSIVE METABOLIC PANEL
ALT: 14 U/L (ref 0–44)
AST: 30 U/L (ref 15–41)
Albumin: 3.2 g/dL — ABNORMAL LOW (ref 3.5–5.0)
Alkaline Phosphatase: 68 U/L (ref 38–126)
Anion gap: 11 (ref 5–15)
BUN: 11 mg/dL (ref 8–23)
CO2: 23 mmol/L (ref 22–32)
Calcium: 9.1 mg/dL (ref 8.9–10.3)
Chloride: 103 mmol/L (ref 98–111)
Creatinine, Ser: 1.04 mg/dL (ref 0.61–1.24)
GFR, Estimated: >60 mL/min (ref >60)
Glucose, Bld: 98 mg/dL (ref 70–99)
Potassium: 5 mmol/L (ref 3.5–5.1)
Sodium: 137 mmol/L (ref 135–145)
Total Bilirubin: 1 mg/dL (ref 0.3–1.2)
Total Protein: 7.2 g/dL (ref 6.5–8.1)

## 2023-10-05 LAB — CBC WITH DIFFERENTIAL/PLATELET
Abs Immature Granulocytes: 0.09 10*3/uL — ABNORMAL HIGH (ref 0.00–0.07)
Basophils Absolute: 0.1 10*3/uL (ref 0.0–0.1)
Basophils Relative: 1 %
Eosinophils Absolute: 0.3 10*3/uL (ref 0.0–0.5)
Eosinophils Relative: 2 %
HCT: 53.7 % — ABNORMAL HIGH (ref 39.0–52.0)
Hemoglobin: 14.9 g/dL (ref 13.0–17.0)
Immature Granulocytes: 1 %
Lymphocytes Relative: 10 %
Lymphs Abs: 1.4 10*3/uL (ref 0.7–4.0)
MCH: 25.3 pg — ABNORMAL LOW (ref 26.0–34.0)
MCHC: 27.7 g/dL — ABNORMAL LOW (ref 30.0–36.0)
MCV: 91 fL (ref 80.0–100.0)
Monocytes Absolute: 1.5 10*3/uL — ABNORMAL HIGH (ref 0.1–1.0)
Monocytes Relative: 11 %
Neutro Abs: 10.3 10*3/uL — ABNORMAL HIGH (ref 1.7–7.7)
Neutrophils Relative %: 75 %
Platelets: 473 10*3/uL — ABNORMAL HIGH (ref 150–400)
RBC: 5.9 MIL/uL — ABNORMAL HIGH (ref 4.22–5.81)
RDW: 17.3 % — ABNORMAL HIGH (ref 11.5–15.5)
WBC: 13.7 10*3/uL — ABNORMAL HIGH (ref 4.0–10.5)
nRBC: 0 % (ref 0.0–0.2)

## 2023-10-05 LAB — PROCALCITONIN: Procalcitonin: <0.1 ng/mL

## 2023-10-05 LAB — MAGNESIUM: Magnesium: 1.9 mg/dL (ref 1.7–2.4)

## 2023-10-05 MED ORDER — ONDANSETRON HCL 4 MG PO TABS: 4.0000 mg | ORAL_TABLET | Freq: Four times a day (QID) | ORAL | Status: DC | PRN

## 2023-10-05 MED ORDER — VENLAFAXINE HCL ER 75 MG PO CP24
150.0000 mg | ORAL_CAPSULE | Freq: Two times a day (BID) | ORAL | Status: DC
  Administered 2023-10-05 – 2023-10-07 (×6): 150 mg via ORAL
  Filled 2023-10-05: qty 2
  Filled 2023-10-05: qty 4
  Filled 2023-10-05 (×3): qty 2
  Filled 2023-10-05: qty 4

## 2023-10-05 MED ORDER — LORAZEPAM 2 MG/ML IJ SOLN: 0.5000 mg | Freq: Four times a day (QID) | INTRAMUSCULAR | Status: DC | PRN

## 2023-10-05 MED ORDER — SACUBITRIL-VALSARTAN 49-51 MG PO TABS
1.0000 | ORAL_TABLET | Freq: Two times a day (BID) | ORAL | Status: DC
  Administered 2023-10-05 – 2023-10-07 (×6): 1 via ORAL
  Filled 2023-10-05 (×6): qty 1

## 2023-10-05 MED ORDER — ACETAMINOPHEN 325 MG PO TABS: 650.0000 mg | ORAL_TABLET | Freq: Four times a day (QID) | ORAL | Status: DC | PRN

## 2023-10-05 MED ORDER — PANTOPRAZOLE SODIUM 40 MG PO TBEC
40.0000 mg | DELAYED_RELEASE_TABLET | Freq: Every day | ORAL | Status: DC
  Administered 2023-10-05 – 2023-10-07 (×3): 40 mg via ORAL
  Filled 2023-10-05 (×3): qty 1

## 2023-10-05 MED ORDER — TRAZODONE HCL 50 MG PO TABS
100.0000 mg | ORAL_TABLET | Freq: Every day | ORAL | Status: DC
  Administered 2023-10-05 – 2023-10-06 (×2): 100 mg via ORAL
  Filled 2023-10-05 (×2): qty 2

## 2023-10-05 MED ORDER — ALFUZOSIN HCL ER 10 MG PO TB24
10.0000 mg | ORAL_TABLET | Freq: Every day | ORAL | Status: DC
  Administered 2023-10-05 – 2023-10-07 (×3): 10 mg via ORAL
  Filled 2023-10-05 (×3): qty 1

## 2023-10-05 MED ORDER — ROSUVASTATIN CALCIUM 20 MG PO TABS
20.0000 mg | ORAL_TABLET | Freq: Every day | ORAL | Status: DC
  Administered 2023-10-05 – 2023-10-07 (×3): 20 mg via ORAL
  Filled 2023-10-05 (×3): qty 1

## 2023-10-05 MED ORDER — OXYCODONE HCL 5 MG PO TABS
5.0000 mg | ORAL_TABLET | ORAL | Status: DC | PRN
  Administered 2023-10-06: 5 mg via ORAL
  Filled 2023-10-05: qty 1

## 2023-10-05 MED ORDER — HEPARIN SODIUM (PORCINE) 5000 UNIT/ML IJ SOLN
5000.0000 [IU] | Freq: Three times a day (TID) | INTRAMUSCULAR | Status: DC
  Administered 2023-10-05 – 2023-10-07 (×7): 5000 [IU] via SUBCUTANEOUS
  Filled 2023-10-05 (×7): qty 1

## 2023-10-05 MED ORDER — GABAPENTIN 100 MG PO CAPS
200.0000 mg | ORAL_CAPSULE | Freq: Two times a day (BID) | ORAL | Status: DC
  Administered 2023-10-05: 200 mg via ORAL
  Filled 2023-10-05: qty 2

## 2023-10-05 MED ORDER — FINASTERIDE 5 MG PO TABS
5.0000 mg | ORAL_TABLET | Freq: Every day | ORAL | Status: DC
  Administered 2023-10-05 – 2023-10-07 (×3): 5 mg via ORAL
  Filled 2023-10-05 (×3): qty 1

## 2023-10-05 MED ORDER — ARIPIPRAZOLE 5 MG PO TABS
5.0000 mg | ORAL_TABLET | Freq: Every day | ORAL | Status: DC
  Administered 2023-10-05 – 2023-10-07 (×3): 5 mg via ORAL
  Filled 2023-10-05 (×3): qty 1

## 2023-10-05 MED ORDER — BISOPROLOL FUMARATE 5 MG PO TABS
5.0000 mg | ORAL_TABLET | Freq: Every day | ORAL | Status: DC
  Administered 2023-10-05 – 2023-10-07 (×3): 5 mg via ORAL
  Filled 2023-10-05 (×3): qty 1

## 2023-10-05 MED ORDER — GABAPENTIN 100 MG PO CAPS
200.0000 mg | ORAL_CAPSULE | Freq: Every day | ORAL | Status: DC
  Administered 2023-10-06: 200 mg via ORAL
  Filled 2023-10-05: qty 2

## 2023-10-05 MED ORDER — HEPARIN SODIUM (PORCINE) 5000 UNIT/ML IJ SOLN: 5000.0000 [IU] | Freq: Three times a day (TID) | INTRAMUSCULAR | Status: DC

## 2023-10-05 MED ORDER — MORPHINE SULFATE (PF) 2 MG/ML IV SOLN: 1.0000 mg | INTRAVENOUS | Status: DC | PRN

## 2023-10-05 MED ORDER — ACETAMINOPHEN 650 MG RE SUPP: 650.0000 mg | Freq: Four times a day (QID) | RECTAL | Status: DC | PRN

## 2023-10-05 MED ORDER — MORPHINE SULFATE (PF) 2 MG/ML IV SOLN: 2.0000 mg | INTRAVENOUS | Status: DC | PRN

## 2023-10-05 MED ORDER — CLOPIDOGREL BISULFATE 75 MG PO TABS
75.0000 mg | ORAL_TABLET | Freq: Every day | ORAL | Status: DC
  Administered 2023-10-05 – 2023-10-07 (×3): 75 mg via ORAL
  Filled 2023-10-05 (×3): qty 1

## 2023-10-05 MED ORDER — ONDANSETRON HCL 4 MG/2ML IJ SOLN: 4.0000 mg | Freq: Four times a day (QID) | INTRAMUSCULAR | Status: DC | PRN

## 2023-10-05 MED ORDER — SPIRONOLACTONE 12.5 MG HALF TABLET
12.5000 mg | ORAL_TABLET | Freq: Every day | ORAL | Status: DC
  Administered 2023-10-05 – 2023-10-07 (×3): 12.5 mg via ORAL
  Filled 2023-10-05 (×3): qty 1

## 2023-10-05 NOTE — ED Notes (Signed)
ED TO INPATIENT HANDOFF REPORT  ED Nurse Name and Phone #: Jennette Kettle [phone]  S Name/Age/Gender Feliz Beam 77 y.o. male Room/Bed: APA07/APA07  Code Status   Code Status: Full Code  Home/SNF/Other Home Patient oriented to: self, place, time, and situation Is this baseline? Yes   Triage Complete: Triage complete  Chief Complaint Acute focal neurological deficit [R29.818]  Triage Note Pt brought in by [facility] from home with c/o generalized weakness, slurred speech, and leaning to the right side suddenly at 1330 today. Family reported to EMS that he fell yesterday and broke his left arm, but didn't hit his head. BP 112/80 for EMS. CODE STROKE activated by EMS.    Allergies Allergies  Allergen Reactions   Lisinopril Cough    Pt doesn't remember reaction    Level of Care/Admitting Diagnosis ED Disposition     ED Disposition  Admit   Condition  --   Comment  Hospital Area: [facility] [[id number]]  Level of Care: Telemetry [5]  Covid Evaluation: Asymptomatic - no recent exposure (last 10 days) testing not required  Diagnosis: Acute focal neurological deficit [730320]  Admitting Physician: Lilyan Gilford [[id number]]  Attending Physician: Lilyan Gilford [[id number]]          B Medical/Surgery History Past Medical History:  Diagnosis Date   Allergic rhinitis    Arthritis    B12 deficiency    Cervicogenic headache    Childhood asthma    Chronic back pain    Coronary atherosclerosis of native coronary artery    Mild nonobstructive CAD at catheterization January 2015   Dementia [facility])    Depression    Essential hypertension    Falls    GERD (gastroesophageal reflux disease)    Glaucoma    History of blood transfusion    History of cerebrovascular disease 07/23/2015   History of kidney stones    History of pneumonia 02/2011   Hyperlipidemia    OSA (obstructive sleep apnea)    CPAP - Dr. Shelle Iron - uses cpap every night   Pneumonia    Prostate  cancer [facility])    PTSD (post-traumatic stress disorder)    [place]   Rectal bleeding    Stroke (HCC) 03/2021   Wheelchair bound    Past Surgical History:  Procedure Laterality Date   APPLICATION OF ROBOTIC ASSISTANCE FOR SPINAL PROCEDURE N/A 03/28/2018   Procedure: APPLICATION OF ROBOTIC ASSISTANCE FOR SPINAL PROCEDURE;  Surgeon: Barnett Abu, MD;  Location: [facility] OR;  Service: Neurosurgery;  Laterality: N/A;   BACK SURGERY  02/14/2012   lumbar OR #7; "today redid L1L2; replaced screws; added bone from hip"   BILATERAL KNEE ARTHROSCOPY     COLONOSCOPY  10/15/2008   Dr. Jena Gauss: tubular adenoma    COLONOSCOPY  12/17/2003   MWU:XLKGMW rectal and colon   COLONOSCOPY N/A 09/05/2014   tubular adenoma   COLONOSCOPY WITH PROPOFOL N/A 09/15/2022   Multiple 3-25 mm polyps in ascending colon and cecum, markedly elongated and redundant colon. One polyp removed piecemeal fashion. 6 month surveillance recommended. Tubular adenomas.   COLONOSCOPY WITH PROPOFOL N/A 02/23/2023   Procedure: COLONOSCOPY WITH PROPOFOL;  Surgeon: Corbin Ade, MD;  [facility];  Service: Endoscopy;  Laterality: N/A;  730am, asa 3   CYSTOSCOPY WITH STENT PLACEMENT Right 01/27/2016   Procedure: CYSTOSCOPY WITH STENT PLACEMENT;  Surgeon: Marcine Matar, MD;  Location: [facility] ORS;  Service: Urology;  Laterality: Right;   CYSTOSCOPY/RETROGRADE/URETEROSCOPY/STONE EXTRACTION WITH BASKET Right 01/27/2016  Procedure: CYSTOSCOPY, RIGHT RETROGRADE, RIGHT URETEROSCOPY, STONE EXTRACTION ;  Surgeon: Marcine Matar, MD;  Location: [facility] ORS;  Service: Urology;  Laterality: Right;   ESOPHAGOGASTRODUODENOSCOPY  10/15/2008   Dr Rourk:Schatzki's ring status post dilation and disruption via 39 F Maloney dilator/ otherwise unremarkable esophagus, small hiatal hernia, multiple fundal gland polyps not manipulated, gastritis, negative H.pylori   ESOPHAGOGASTRODUODENOSCOPY  06/21/2002   UXL:KGMWN sliding hiatal hernia with mild changes of  reflux esophagitis limited to gastroesophageal junction.  Noncritical ring at distal esophagus, 3 cm proximal to gastroesophageal junction/Antral gastritis   FACIAL COSMETIC SURGERY  ` 1985   "broke face playing softball"   FLEXIBLE SIGMOIDOSCOPY N/A 07/28/2022   Procedure: FLEXIBLE SIGMOIDOSCOPY;  Surgeon: Corbin Ade, MD;  [facility];  Service: Endoscopy;  Laterality: N/A;   FRACTURE SURGERY     "left wrist; broke it; took spur off"   HEMOSTASIS CLIP PLACEMENT  02/23/2023   Procedure: HEMOSTASIS CLIP PLACEMENT;  Surgeon: Corbin Ade, MD;  [facility];  Service: Endoscopy;;   HOLMIUM LASER APPLICATION Right 01/27/2016   Procedure: HOLMIUM LASER APPLICATION;  Surgeon: Marcine Matar, MD;  Location: [facility] ORS;  Service: Urology;  Laterality: Right;   KNEE ARTHROSCOPY Right 05/18/2018   Procedure: PARTIAL MEDIAL MENISECTOMY AND SURGICAL LAVAGE AND CHONDROPLASTY;  Surgeon: Teryl Lucy, MD;  Location: [facility] OR;  Service: Orthopedics;  Laterality: Right;   LEFT HEART CATHETERIZATION WITH CORONARY ANGIOGRAM N/A 12/27/2013   Procedure: LEFT HEART CATHETERIZATION WITH CORONARY ANGIOGRAM;  Surgeon: Peter M [place], MD;  Location: [facility] CATH LAB;  Service: Cardiovascular;  Laterality: N/A;   neck epidural     POLYPECTOMY  09/15/2022   Procedure: POLYPECTOMY;  Surgeon: Corbin Ade, MD;  [facility];  Service: Endoscopy;;   POLYPECTOMY  02/23/2023   Procedure: POLYPECTOMY INTESTINAL;  Surgeon: Corbin Ade, MD;  [facility];  Service: Endoscopy;;   POSTERIOR LUMBAR FUSION 4 LEVEL N/A 03/28/2018   Procedure: Thoracic eight -Lumbar two- FIXATION WITH SCREW PLACEMENT, DECOMPRESSION Thoracic ten-Thoracic eleven  FOR OSTEOMYELITIS;  Surgeon: Barnett Abu, MD;  Location[facility];  Service: Neurosurgery;  Laterality: N/A;   SCLEROTHERAPY  09/15/2022   Procedure: SCLEROTHERAPY;  Surgeon: Corbin Ade, MD;  [facility];  Service: Endoscopy;;    SHOULDER SURGERY Bilateral    SPINAL CORD STIMULATOR REMOVAL N/A 02/25/2020   Procedure: LUMBAR SPINAL CORD STIMULATOR REMOVAL;  Surgeon: Barnett Abu, MD;  Location: [facility] OR;  Service: Neurosurgery;  Laterality: N/A;   TEE WITHOUT CARDIOVERSION N/A 03/21/2018   Procedure: TRANSESOPHAGEAL ECHOCARDIOGRAM (TEE) WITH PROPOFOL;  Surgeon: Jonelle Sidle, MD;  [facility];  Service: Cardiovascular;  Laterality: N/A;     A IV Location/Drains/Wounds Patient Lines/Drains/Airways Status     Active Line/Drains/Airways     Name Placement date Placement time Site Days   Peripheral IV 20 G 1" Right Antecubital --  --  Antecubital  --   Peripheral IV 10/04/23 20 G Anterior;Right Forearm 10/04/23  2040  Forearm  1            Intake/Output Last 24 hours  Intake/Output Summary (Last 24 hours) at 10/05/2023 1133 Last data filed at 10/04/2023 2258 Gross per 24 hour  Intake 500 ml  Output --  Net 500 ml    Labs/Imaging Results for orders placed or performed during the hospital encounter of 10/04/23 (from the past 48 hour(s))  SARS Coronavirus 2 by RT PCR (hospital order, performed in [facility]  hospital lab) *cepheid single result test* Anterior Nasal Swab     Status: None   Collection Time: 10/04/23  7:40 PM   Specimen: Anterior Nasal Swab  Result Value Ref Range   SARS Coronavirus 2 by RT PCR NEGATIVE NEGATIVE    Comment: (NOTE) SARS-CoV-2 target nucleic acids are NOT DETECTED.  The SARS-CoV-2 RNA is generally detectable in upper and lower respiratory specimens during the acute phase of infection. The lowest concentration of SARS-CoV-2 viral copies this assay can detect is 250 copies / mL. A negative result does not preclude SARS-CoV-2 infection and should not be used as the sole basis for treatment or other patient management decisions.  A negative result may occur with improper specimen collection / handling, submission of specimen other than nasopharyngeal swab,  presence of viral mutation(s) within the areas targeted by this assay, and inadequate number of viral copies (<250 copies / mL). A negative result must be combined with clinical observations, patient history, and epidemiological information.  Fact Sheet for Patients:   RoadLapTop.co.za  Fact Sheet for Healthcare Providers: http://kim-miller.com/  This test is not yet approved or  cleared by the [place] FDA and has been authorized for detection and/or diagnosis of SARS-CoV-2 by FDA under an Emergency Use Authorization (EUA).  This EUA will remain in effect (meaning this test can be used) for the duration of the COVID-19 declaration under Section 564(b)(1) of the Act, 21 U.S.C. section 360bbb-3(b)(1), unless the authorization is terminated or revoked sooner.  Performed at [facility], [address] [place], [place] [address]   Dickie La 8, ED     Status: Abnormal   Collection Time: 10/04/23  7:43 PM  Result Value Ref Range   Sodium 138 135 - 145 mmol/L   Potassium 5.2 (H) 3.5 - 5.1 mmol/L   Chloride 103 98 - 111 mmol/L   BUN 14 8 - 23 mg/dL   Creatinine, Ser 6.04 (H) 0.61 - 1.24 mg/dL   Glucose, Bld 540 (H) 70 - 99 mg/dL    Comment: Glucose reference range applies only to samples taken after fasting for at least 8 hours.   Calcium, Ion 1.12 (L) 1.15 - 1.40 mmol/L   TCO2 26 22 - 32 mmol/L   Hemoglobin 19.0 (H) 13.0 - 17.0 g/dL   HCT 98.1 (H) 19.1 - 47.8 %  Protime-INR     Status: None   Collection Time: 10/04/23  7:43 PM  Result Value Ref Range   Prothrombin Time 14.5 11.4 - 15.2 seconds   INR 1.1 0.8 - 1.2    Comment: (NOTE) INR goal varies based on device and disease states. Performed at [facility], [address], [place], [place] [address]   APTT     Status: None   Collection Time: 10/04/23  7:43 PM  Result Value Ref Range   aPTT 28 24 - 36 seconds    Comment: Performed at [facility], [address]  [place], [place] [address]  Lactic acid, plasma     Status: None   Collection Time: 10/04/23  7:43 PM  Result Value Ref Range   Lactic Acid, Venous 1.4 0.5 - 1.9 mmol/L    Comment: Performed at [facility], [address], [place], [place] [address]  Blood gas, arterial     Status: Abnormal   Collection Time: 10/04/23  8:24 PM  Result Value Ref Range   FIO2 21.0 %   pH, Arterial 7.42 7.35 - 7.45   pCO2 arterial 38 32 -  48 mmHg   pO2, Arterial 69 (L) 83 - 108 mmHg   Bicarbonate 24.6 20.0 - 28.0 mmol/L   Acid-Base Excess 0.3 0.0 - 2.0 mmol/L   O2 Saturation 95.6 %   Patient temperature 37.1    Collection site RIGHT RADIAL    Drawn by [id number]    Allens test (pass/fail) PASS PASS    Comment: Performed at [facility], [address] [place], [place] [address]  Ammonia     Status: None   Collection Time: 10/04/23  8:46 PM  Result Value Ref Range   Ammonia 21 9 - 35 umol/L    Comment: Performed at [facility], [address] [place], [place] [address]  Ethanol     Status: None   Collection Time: 10/04/23  8:49 PM  Result Value Ref Range   Alcohol, Ethyl (B) <10 <10 mg/dL    Comment: (NOTE) Lowest detectable limit for serum alcohol is 10 mg/dL.  For medical purposes only. Performed at [facility], [address], [place], [place] [address]   Comprehensive metabolic panel     Status: Abnormal   Collection Time: 10/04/23  8:49 PM  Result Value Ref Range   Sodium 135 135 - 145 mmol/L   Potassium 4.2 3.5 - 5.1 mmol/L   Chloride 103 98 - 111 mmol/L   CO2 23 22 - 32 mmol/L   Glucose, Bld 96 70 - 99 mg/dL    Comment: Glucose reference range applies only to samples taken after fasting for at least 8 hours.   BUN 12 8 - 23 mg/dL   Creatinine, Ser 2.95 0.61 - 1.24 mg/dL   Calcium 8.7 (L) 8.9 - 10.3 mg/dL   Total Protein 7.0 6.5 - 8.1 g/dL   Albumin 3.1 (L) 3.5 - 5.0 g/dL   AST 15 15 - 41 U/L   ALT 13 0 - 44 U/L   Alkaline Phosphatase 69 38 - 126 U/L   Total Bilirubin 0.8 0.3 - 1.2 mg/dL    GFR, Estimated >62 >13 mL/min    Comment: (NOTE) Calculated using the CKD-EPI Creatinine Equation (2021)    Anion gap 9 5 - 15    Comment: Performed at [facility], [address], [place], [place] [address]  Procalcitonin     Status: None   Collection Time: 10/04/23  8:49 PM  Result Value Ref Range   Procalcitonin <0.10 ng/mL    Comment:        Interpretation: PCT (Procalcitonin) <= 0.5 ng/mL: Systemic infection (sepsis) is not likely. Local bacterial infection is possible. (NOTE)       Sepsis PCT Algorithm           Lower Respiratory Tract                                      Infection PCT Algorithm    ----------------------------     ----------------------------         PCT < 0.25 ng/mL                PCT < 0.10 ng/mL          Strongly encourage             Strongly discourage   discontinuation of antibiotics    initiation of antibiotics    ----------------------------     -----------------------------       PCT 0.25 - 0.50 ng/mL  PCT 0.10 - 0.25 ng/mL               OR       >80% decrease in PCT            Discourage initiation of                                            antibiotics      Encourage discontinuation           of antibiotics    ----------------------------     -----------------------------         PCT >= 0.50 ng/mL              PCT 0.26 - 0.50 ng/mL               AND        <80% decrease in PCT             Encourage initiation of                                             antibiotics       Encourage continuation           of antibiotics    ----------------------------     -----------------------------        PCT >= 0.50 ng/mL                  PCT > 0.50 ng/mL               AND         increase in PCT                  Strongly encourage                                      initiation of antibiotics    Strongly encourage escalation           of antibiotics                                     -----------------------------                                            PCT <= 0.25 ng/mL                                                 OR                                        > 80% decrease in PCT  Discontinue / Do not initiate                                             antibiotics  Performed at [facility], [address] [place], [place] [address]   CBC with Differential/Platelet     Status: Abnormal   Collection Time: 10/04/23  8:50 PM  Result Value Ref Range   WBC 13.2 (H) 4.0 - 10.5 K/uL   RBC 6.03 (H) 4.22 - 5.81 MIL/uL   Hemoglobin 15.3 13.0 - 17.0 g/dL   HCT 28.4 (H) 13.2 - 44.0 %   MCV 87.1 80.0 - 100.0 fL   MCH 25.4 (L) 26.0 - 34.0 pg   MCHC 29.1 (L) 30.0 - 36.0 g/dL   RDW 10.2 (H) 72.5 - 36.6 %   Platelets 576 (H) 150 - 400 K/uL   nRBC 0.0 0.0 - 0.2 %   Neutrophils Relative % 82 %   Neutro Abs 10.8 (H) 1.7 - 7.7 K/uL   Lymphocytes Relative 5 %   Lymphs Abs 0.7 0.7 - 4.0 K/uL   Monocytes Relative 10 %   Monocytes Absolute 1.3 (H) 0.1 - 1.0 K/uL   Eosinophils Relative 1 %   Eosinophils Absolute 0.1 0.0 - 0.5 K/uL   Basophils Relative 1 %   Basophils Absolute 0.1 0.0 - 0.1 K/uL   Immature Granulocytes 1 %   Abs Immature Granulocytes 0.16 (H) 0.00 - 0.07 K/uL    Comment: Performed at [facility], [address] [place], [place] [address]  Urine rapid drug screen (hosp performed)     Status: Abnormal   Collection Time: 10/04/23 10:10 PM  Result Value Ref Range   Opiates POSITIVE (A) NONE DETECTED   Cocaine NONE DETECTED NONE DETECTED   Benzodiazepines NONE DETECTED NONE DETECTED   Amphetamines NONE DETECTED NONE DETECTED   Tetrahydrocannabinol NONE DETECTED NONE DETECTED   Barbiturates NONE DETECTED NONE DETECTED    Comment: (NOTE) DRUG SCREEN FOR MEDICAL PURPOSES ONLY.  IF CONFIRMATION IS NEEDED FOR ANY PURPOSE, NOTIFY LAB WITHIN 5 DAYS.  LOWEST DETECTABLE LIMITS FOR URINE DRUG SCREEN Drug Class                     Cutoff (ng/mL) Amphetamine and metabolites     1000 Barbiturate and metabolites    200 Benzodiazepine                 200 Opiates and metabolites        300 Cocaine and metabolites        300 THC                            50 Performed at [facility], [address] [place], [place] [address]   Urinalysis, w/ Reflex to Culture (Infection Suspected) -Urine, Clean Catch     Status: Abnormal   Collection Time: 10/04/23 10:10 PM  Result Value Ref Range   Specimen Source URINE, CLEAN CATCH    Color, Urine YELLOW YELLOW   APPearance CLEAR CLEAR   Specific Gravity, Urine 1.020 1.005 - 1.030   pH 5.0 5.0 - 8.0   Glucose, UA >=500 (A) NEGATIVE mg/dL   Hgb urine dipstick MODERATE (A) NEGATIVE   Bilirubin Urine NEGATIVE NEGATIVE   Ketones, ur NEGATIVE NEGATIVE mg/dL   Protein, ur NEGATIVE  NEGATIVE mg/dL   Nitrite NEGATIVE NEGATIVE   Leukocytes,Ua NEGATIVE NEGATIVE   RBC / HPF 0-5 0 - 5 RBC/hpf   WBC, UA 6-10 0 - 5 WBC/hpf    Comment:        Reflex urine culture not performed if WBC <=10, OR if Squamous epithelial cells >5. If Squamous epithelial cells >5 suggest recollection.    Bacteria, UA NONE SEEN NONE SEEN   Squamous Epithelial / HPF 0-5 0 - 5 /HPF   Mucus PRESENT     Comment: Performed at [facility], [address], [place], [place] [address]  Comprehensive metabolic panel     Status: Abnormal   Collection Time: 10/05/23  3:19 AM  Result Value Ref Range   Sodium 137 135 - 145 mmol/L   Potassium 5.0 3.5 - 5.1 mmol/L   Chloride 103 98 - 111 mmol/L   CO2 23 22 - 32 mmol/L   Glucose, Bld 98 70 - 99 mg/dL    Comment: Glucose reference range applies only to samples taken after fasting for at least 8 hours.   BUN 11 8 - 23 mg/dL   Creatinine, Ser 1.91 0.61 - 1.24 mg/dL   Calcium 9.1 8.9 - 47.8 mg/dL   Total Protein 7.2 6.5 - 8.1 g/dL   Albumin 3.2 (L) 3.5 - 5.0 g/dL   AST 30 15 - 41 U/L   ALT 14 0 - 44 U/L   Alkaline Phosphatase 68 38 - 126 U/L   Total Bilirubin 1.0 0.3 - 1.2 mg/dL   GFR, Estimated >29 >56 mL/min     Comment: (NOTE) Calculated using the CKD-EPI Creatinine Equation (2021)    Anion gap 11 5 - 15    Comment: Performed at [facility], [address] [place], [place] [address]  Magnesium     Status: None   Collection Time: 10/05/23  3:19 AM  Result Value Ref Range   Magnesium 1.9 1.7 - 2.4 mg/dL    Comment: Performed at [facility], [address], [place], [place] [address]  CBC with Differential/Platelet     Status: Abnormal   Collection Time: 10/05/23  3:19 AM  Result Value Ref Range   WBC 13.7 (H) 4.0 - 10.5 K/uL   RBC 5.90 (H) 4.22 - 5.81 MIL/uL   Hemoglobin 14.9 13.0 - 17.0 g/dL   HCT 69.6 (H) 29.5 - 28.4 %   MCV 91.0 80.0 - 100.0 fL   MCH 25.3 (L) 26.0 - 34.0 pg   MCHC 27.7 (L) 30.0 - 36.0 g/dL   RDW 13.2 (H) 44.0 - 10.2 %   Platelets 473 (H) 150 - 400 K/uL   nRBC 0.0 0.0 - 0.2 %   Neutrophils Relative % 75 %   Neutro Abs 10.3 (H) 1.7 - 7.7 K/uL   Lymphocytes Relative 10 %   Lymphs Abs 1.4 0.7 - 4.0 K/uL   Monocytes Relative 11 %   Monocytes Absolute 1.5 (H) 0.1 - 1.0 K/uL   Eosinophils Relative 2 %   Eosinophils Absolute 0.3 0.0 - 0.5 K/uL   Basophils Relative 1 %   Basophils Absolute 0.1 0.0 - 0.1 K/uL   Immature Granulocytes 1 %   Abs Immature Granulocytes 0.09 (H) 0.00 - 0.07 K/uL    Comment: Performed at [facility], [address], [place], [place] [address]   MR CERVICAL SPINE WO CONTRAST  Result Date: 10/05/2023 CLINICAL DATA:  Weakness and neck pain. Neuro deficit with acute stroke suspected EXAM: MRI HEAD WITHOUT CONTRAST  MRI CERVICAL SPINE WITHOUT CONTRAST TECHNIQUE: Multiplanar, multiecho pulse sequences of the brain and surrounding structures, and cervical spine, to include the craniocervical junction and cervicothoracic junction, were obtained without intravenous contrast. COMPARISON:  08/02/2023 brain MRI and 06/16/2023 cervical MRI. FINDINGS: MRI HEAD FINDINGS Brain: No acute infarction, hemorrhage, hydrocephalus, extra-axial collection or mass lesion.  Extensive chronic small vessel ischemic gliosis that is confluent in the deep cerebral white matter. Chronic lacunar infarct in the left pons. Brain atrophy. Expanded CSF spaces along the lateral cerebellum which is symmetric no discrete in measurable collection. Generalized brain atrophy is fairly mild. Vascular: Normal flow voids. Skull and upper cervical spine: No focal marrow lesion. Cervical spine described below. Sinuses/Orbits: Negative. Other: Intermittent motion artifact but overall diagnostic scan for the indication. MRI CERVICAL SPINE FINDINGS Alignment: Straightening of cervical lordosis. Vertebrae: No marrow edema adjacent to the type 2 dens fracture which has normal alignment. No acute fracture. Confluent marrow at C3-C7, solid arthrodesis appearance. No acute fracture or aggressive bone lesion Cord: Normal signal and morphology when accounting for levels of motion artifact. Posterior Fossa, vertebral arteries, paraspinal tissues: No evidence of inflammation or mass Disc levels: C2-3: Disc space narrowing and bulging with uncovertebral and facet spurring. High-grade left foraminal impingement and moderate right foraminal narrowing. Mild spinal canal stenosis. C3-4: ACDF with solid arthrodesis.  Patent canal and foramina C4-5: ACDF with solid arthrodesis. Patent canal and foramina C5-6: ACDF with solid arthrodesis. Patent canal and foramina C6-7: ACDF with solid arthrodesis. Patent canal and foramina C7-T1:Degenerative facet spurring. Disc space narrowing and bulging with uncovertebral spurring. Left more than right foraminal impingement. Patent spinal canal despite pronounced ligamentum flavum thickening. IMPRESSION: Brain MRI: 1. No acute or reversible finding. 2. Advanced chronic small vessel disease. Cervical MRI: 1. No emergent finding. 2. Remote type 2 dens fracture with unchanged and normal alignment. 3. Solid arthrodesis at C3-C7 ACDF. 4. Degenerative foraminal impingement at C2-3 and C7-T1,  especially left-sided. Electronically Signed   By: Tiburcio Pea M.D.   On: 10/05/2023 11:30   MR BRAIN WO CONTRAST  Result Date: 10/05/2023 CLINICAL DATA:  Weakness and neck pain. Neuro deficit with acute stroke suspected EXAM: MRI HEAD WITHOUT CONTRAST MRI CERVICAL SPINE WITHOUT CONTRAST TECHNIQUE: Multiplanar, multiecho pulse sequences of the brain and surrounding structures, and cervical spine, to include the craniocervical junction and cervicothoracic junction, were obtained without intravenous contrast. COMPARISON:  08/02/2023 brain MRI and 06/16/2023 cervical MRI. FINDINGS: MRI HEAD FINDINGS Brain: No acute infarction, hemorrhage, hydrocephalus, extra-axial collection or mass lesion. Extensive chronic small vessel ischemic gliosis that is confluent in the deep cerebral white matter. Chronic lacunar infarct in the left pons. Brain atrophy. Expanded CSF spaces along the lateral cerebellum which is symmetric no discrete in measurable collection. Generalized brain atrophy is fairly mild. Vascular: Normal flow voids. Skull and upper cervical spine: No focal marrow lesion. Cervical spine described below. Sinuses/Orbits: Negative. Other: Intermittent motion artifact but overall diagnostic scan for the indication. MRI CERVICAL SPINE FINDINGS Alignment: Straightening of cervical lordosis. Vertebrae: No marrow edema adjacent to the type 2 dens fracture which has normal alignment. No acute fracture. Confluent marrow at C3-C7, solid arthrodesis appearance. No acute fracture or aggressive bone lesion Cord: Normal signal and morphology when accounting for levels of motion artifact. Posterior Fossa, vertebral arteries, paraspinal tissues: No evidence of inflammation or mass Disc levels: C2-3: Disc space narrowing and bulging with uncovertebral and facet spurring. High-grade left foraminal impingement and moderate right foraminal narrowing. Mild spinal canal stenosis. C3-4: ACDF  with solid arthrodesis.  Patent canal  and foramina C4-5: ACDF with solid arthrodesis. Patent canal and foramina C5-6: ACDF with solid arthrodesis. Patent canal and foramina C6-7: ACDF with solid arthrodesis. Patent canal and foramina C7-T1:Degenerative facet spurring. Disc space narrowing and bulging with uncovertebral spurring. Left more than right foraminal impingement. Patent spinal canal despite pronounced ligamentum flavum thickening. IMPRESSION: Brain MRI: 1. No acute or reversible finding. 2. Advanced chronic small vessel disease. Cervical MRI: 1. No emergent finding. 2. Remote type 2 dens fracture with unchanged and normal alignment. 3. Solid arthrodesis at C3-C7 ACDF. 4. Degenerative foraminal impingement at C2-3 and C7-T1, especially left-sided. Electronically Signed   By: Tiburcio Pea M.D.   On: 10/05/2023 11:30   DG Chest Portable 1 View  Result Date: 10/04/2023 CLINICAL DATA:  Altered mental status EXAM: PORTABLE CHEST 1 VIEW COMPARISON:  04/25/2018 FINDINGS: Low lung volumes. Cardiomegaly with vascular congestion. Enlarged mediastinal silhouette compared to prior likely due to patient rotation and portable technique. No pleural effusion or focal airspace opacity. Hardware in the cervical spine IMPRESSION: Low lung volumes. Cardiomegaly with vascular congestion. Electronically Signed   By: Jasmine Pang M.D.   On: 10/04/2023 21:56   CT HEAD CODE STROKE WO CONTRAST  Result Date: 10/04/2023 CLINICAL DATA:  Code stroke. Slurred speech, leading to right side, generalized weakness EXAM: CT HEAD WITHOUT CONTRAST TECHNIQUE: Contiguous axial images were obtained from the base of the skull through the vertex without intravenous contrast. RADIATION DOSE REDUCTION: This exam was performed according to the departmental dose-optimization program which includes automated exposure control, adjustment of the mA and/or kV according to patient size and/or use of iterative reconstruction technique. COMPARISON:  06/16/2023 FINDINGS: Brain: No  evidence of acute infarction, hemorrhage, mass, mass effect, or midline shift. No hydrocephalus or extra-axial collection. Mildly advanced cerebral atrophy for age, with remote lacunar infarcts in the basal ganglia and pons. Periventricular white matter changes, likely the sequela of chronic small vessel ischemic disease. Vascular: No hyperdense vessel. Skull: Negative for fracture or focal lesion. Sinuses/Orbits: No acute finding. Other: The mastoid air cells are well aerated. ASPECTS [facility] Stroke Program Early CT Score) - Ganglionic level infarction (caudate, lentiform nuclei, internal capsule, insula, M1-M3 cortex): 7 - Supraganglionic infarction (M4-M6 cortex): 3 Total score (0-10 with 10 being normal): 10 IMPRESSION: 1. No acute intracranial process. 2. ASPECTS is 10. Code stroke imaging results were communicated on 10/04/2023 at 7:05 pm to provider [place] via telephone, who verbally acknowledged these results. Electronically Signed   By: Wiliam Ke M.D.   On: 10/04/2023 19:05   DG Wrist Complete Left  Result Date: 10/03/2023 CLINICAL DATA:  Left wrist pain and swelling after fall this afternoon EXAM: LEFT WRIST - COMPLETE 3+ VIEW COMPARISON:  02/16/2019 FINDINGS: Mildly displaced distal radial fracture primarily involving the metaphysis. Possible radiocarpal involvement. No additional acute fracture. There is chondrocalcinosis in the region of the triangular fibrocartilage, chronic. Corticated density adjacent to the ulna styloid, chronic. Lateral view is limited due to positioning. Generalized soft tissue edema. IMPRESSION: Mildly displaced distal radial fracture primarily involving the metaphysis. Electronically Signed   By: Narda Rutherford M.D.   On: 10/03/2023 17:22   ECHOCARDIOGRAM COMPLETE  Result Date: 10/03/2023    ECHOCARDIOGRAM REPORT   Patient Name:   ISADOR FACEMIRE Date of Exam: 10/03/2023 Medical Rec #:  [id number]         Height:       75.0 in Accession #:    [id number]  Weight:       270.0 lb Date of Birth:  1946/08/23          BSA:          2.493 m Patient Age:    77 years          BP:           120/77 mmHg Patient Gender: M                 HR:           77 bpm. [facility] Procedure: 2D Echo, Cardiac Doppler and Color Doppler Indications:    I42.9 Cardiomyopathy (unspecified)  History:        Patient has prior history of Echocardiogram examinations, most                 recent 03/21/2018. CHF, CAD, Abnormal ECG, Stroke,                 Arrythmias:Bradycardia, Signs/Symptoms:Bacteremia; Risk                 Factors:Hypertension, Sleep Apnea, Current Smoker and                 Dyslipidemia.  Sonographer:    Sheralyn Boatman RDCS Referring Phys: [id number] [facility]  Sonographer Comments: Technically difficult study due to poor echo windows, suboptimal parasternal window and patient is obese. Image acquisition challenging due to patient body habitus. Patient had mobility issues. Very difficult study. Had difficulty getting from chair to bed. Patient fell in parking lot before appointment. IMPRESSIONS  1. Left ventricular ejection fraction, by estimation, is 30 to 35%. The left ventricle has moderately decreased function. The left ventricle demonstrates global hypokinesis. There is moderate left ventricular hypertrophy. Left ventricular diastolic parameters are consistent with Grade I diastolic dysfunction (impaired relaxation).  2. Right ventricular systolic function is normal. The right ventricular size is normal. Tricuspid regurgitation signal is inadequate for assessing PA pressure.  3. The mitral valve was not well visualized. No evidence of mitral valve regurgitation. No evidence of mitral stenosis.  4. The aortic valve was not well visualized. Aortic valve regurgitation is not visualized. No aortic stenosis is present.  5. The inferior vena cava is normal in size with greater than 50% respiratory variability, suggesting right atrial pressure of 3 mmHg. FINDINGS   Left Ventricle: Left ventricular ejection fraction, by estimation, is 30 to 35%. The left ventricle has moderately decreased function. The left ventricle demonstrates global hypokinesis. The left ventricular internal cavity size was normal in size. There is moderate left ventricular hypertrophy. Left ventricular diastolic parameters are consistent with Grade I diastolic dysfunction (impaired relaxation). Indeterminate filling pressures. Right Ventricle: The right ventricular size is normal. Right vetricular wall thickness was not well visualized. Right ventricular systolic function is normal. Tricuspid regurgitation signal is inadequate for assessing PA pressure. Left Atrium: Left atrial size was not well visualized. Right Atrium: Right atrial size was not well visualized. Pericardium: There is no evidence of pericardial effusion. Mitral Valve: The mitral valve was not well visualized. No evidence of mitral valve regurgitation. No evidence of mitral valve stenosis. Tricuspid Valve: The tricuspid valve is normal in structure. Tricuspid valve regurgitation is not demonstrated. No evidence of tricuspid stenosis. Aortic Valve: The aortic valve was not well visualized. Aortic valve regurgitation is not visualized. No aortic stenosis is present. Aortic valve mean gradient measures 5.1 mmHg. Aortic valve peak gradient measures 9.0 mmHg. Aortic valve  area, by VTI measures 2.50 cm. Pulmonic Valve: The pulmonic valve was not well visualized. Pulmonic valve regurgitation is not visualized. No evidence of pulmonic stenosis. Aorta: The aortic root and ascending aorta are structurally normal, with no evidence of dilitation. Venous: The inferior vena cava is normal in size with greater than 50% respiratory variability, suggesting right atrial pressure of 3 mmHg. IAS/Shunts: The interatrial septum was not well visualized.  LEFT VENTRICLE PLAX 2D LVIDd:         4.60 cm LVIDs:         4.00 cm LV PW:         1.30 cm LV IVS:         1.40 cm LVOT diam:     2.30 cm LV SV:         81 LV SV Index:   32 LVOT Area:     4.15 cm  LV Volumes (MOD) LV vol d, MOD A2C: 106.0 ml LV vol d, MOD A4C: 126.0 ml LV vol s, MOD A2C: 82.6 ml LV vol s, MOD A4C: 72.1 ml LV SV MOD A2C:     23.4 ml LV SV MOD A4C:     126.0 ml LV SV MOD BP:      38.8 ml RIGHT VENTRICLE            IVC RV S prime:     6.96 cm/s  IVC diam: 2.00 cm TAPSE (M-mode): 1.5 cm LEFT ATRIUM           Index        RIGHT ATRIUM           Index LA diam:      4.00 cm 1.60 cm/m   RA Area:     11.00 cm LA Vol (A2C): 46.3 ml 18.57 ml/m  RA Volume:   22.20 ml  8.91 ml/m LA Vol (A4C): 47.0 ml 18.85 ml/m  AORTIC VALVE AV Area (Vmax):    3.07 cm AV Area (Vmean):   2.75 cm AV Area (VTI):     2.50 cm AV Vmax:           149.61 cm/s AV Vmean:          106.716 cm/s AV VTI:            0.323 m AV Peak Grad:      9.0 mmHg AV Mean Grad:      5.1 mmHg LVOT Vmax:         110.50 cm/s LVOT Vmean:        70.700 cm/s LVOT VTI:          0.194 m LVOT/AV VTI ratio: 0.60  AORTA Ao Root diam: 3.80 cm Ao Asc diam:  3.60 cm MITRAL VALVE MV Area (PHT): 2.60 cm    SHUNTS MV Decel Time: 292 msec    Systemic VTI:  0.19 m MV E velocity: 37.60 cm/s  Systemic Diam: 2.30 cm MV A velocity: 79.10 cm/s MV E/A ratio:  0.48 Dina Rich MD Electronically signed by Dina Rich MD Signature Date/Time: 10/03/2023/12:41:59 PM    Final     Pending Labs Unresulted Labs (From admission, onward)     Start     Ordered   10/06/23 0500  CBC  Tomorrow morning,   R        10/05/23 1034   10/06/23 0500  Basic metabolic panel  Tomorrow morning,   R        10/05/23 1034   10/04/23 2050  CBC with Differential/Platelet  Once,   R        10/04/23 2050            Vitals/Pain Today's Vitals   10/05/23 0630 10/05/23 0700 10/05/23 0735 10/05/23 1030  BP: (!) 115/53 (!) 124/51  (!) 141/80  Pulse: 73 67  (!) 51  Resp: 11 13  16   Temp:  98.4 F (36.9 C)  98.2 F (36.8 C)  TempSrc:  Oral  Oral  SpO2: 96% 95%  95%  Weight:    128.8 kg   Height:   6\' 3"  (1.905 m)   PainSc:        Isolation Precautions No active isolations  Medications Medications  bisoprolol (ZEBETA) tablet 5 mg (has no administration in time range)  rosuvastatin (CRESTOR) tablet 20 mg (has no administration in time range)  sacubitril-valsartan (ENTRESTO) 49-51 mg per tablet (1 tablet Oral Given 10/05/23 0105)  spironolactone (ALDACTONE) tablet 12.5 mg (has no administration in time range)  ARIPiprazole (ABILIFY) tablet 5 mg (has no administration in time range)  traZODone (DESYREL) tablet 100 mg (0 mg Oral Hold 10/05/23 0133)  venlafaxine XR (EFFEXOR-XR) 24 hr capsule 150 mg (150 mg Oral Given 10/05/23 0106)  pantoprazole (PROTONIX) EC tablet 40 mg (has no administration in time range)  alfuzosin (UROXATRAL) 24 hr tablet 10 mg (has no administration in time range)  finasteride (PROSCAR) tablet 5 mg (has no administration in time range)  clopidogrel (PLAVIX) tablet 75 mg (has no administration in time range)  acetaminophen (TYLENOL) tablet 650 mg (has no administration in time range)    Or  acetaminophen (TYLENOL) suppository 650 mg (has no administration in time range)  oxyCODONE (Oxy IR/ROXICODONE) immediate release tablet 5 mg (has no administration in time range)  ondansetron (ZOFRAN) tablet 4 mg (has no administration in time range)    Or  ondansetron (ZOFRAN) injection 4 mg (has no administration in time range)  heparin injection 5,000 Units (5,000 Units Subcutaneous Given 10/05/23 0505)  morphine (PF) 2 MG/ML injection 1 mg (has no administration in time range)  gabapentin (NEURONTIN) capsule 200 mg (has no administration in time range)  naloxone [facility]) injection 0.4 mg (0.4 mg Intravenous Given 10/04/23 1943)  sodium chloride 0.9 % bolus 500 mL (0 mLs Intravenous Stopped 10/04/23 2258)    Mobility power wheelchair     Focused Assessments Neuro Assessment Handoff:  Swallow screen pass? Yes  Cardiac Rhythm: Normal sinus  rhythm NIH Stroke Scale  Dizziness Present: No Headache Present: No Interval: Shift assessment Level of Consciousness (1a.)   : Alert, keenly responsive LOC Questions (1b. )   : Answers both questions correctly LOC Commands (1c. )   : Performs both tasks correctly Best Gaze (2. )  : Normal Visual (3. )  : No visual loss Facial Palsy (4. )    : Normal symmetrical movements Motor Arm, Left (5a. )   : No drift Motor Arm, Right (5b. ) : No drift Motor Leg, Left (6a. )  : No drift Motor Leg, Right (6b. ) : No drift Limb Ataxia (7. ): Absent Sensory (8. )  : Normal, no sensory loss Best Language (9. )  : No aphasia Dysarthria (10. ): Normal Extinction/Inattention (11.)   : No Abnormality Complete NIHSS TOTAL: 0 Last date known well: 10/04/23 Last time known well: 1329 Neuro Assessment: Exceptions to WDL Neuro Checks:   Initial (10/04/23 1915)  Has TPA been given? No If patient is a Neuro Trauma and patient  is going to OR before floor call report to 4N Charge nurse: [phone] or [phone]   R Recommendations: See Admitting Provider Note  Report given to:   Additional Notes:
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
## 2023-10-05 NOTE — ED Notes (Signed)
Pt given applesauce with medication

## 2023-10-05 NOTE — Assessment & Plan Note (Signed)
-   Reported slurred speech and drifting to the right - Neurology consulted and thinks more likely this is metabolic than a stroke - CT head was no acute intracranial abnormality - MRI brain in the a.m. - Only if the MRI brain shows a stroke: The rest of the stroke workup be done

## 2023-10-05 NOTE — Assessment & Plan Note (Signed)
-   Patient chews tobacco - Declines nicotine patch at this time

## 2023-10-05 NOTE — Assessment & Plan Note (Signed)
-   Continue Zebeta, Crestor, Entresto, Aldactone - Last echo was yesterday that showed ejection fraction of 30 to 35% - This is an improvement from the echo previously that showed an ejection fraction of 20-25% per girlfriend at bedside - No clinical signs of acute exacerbation

## 2023-10-05 NOTE — Progress Notes (Signed)
Pt is active with [facility] HHRN/PT. Clydie Braun with [facility] aware of admission. TOC will follow for home health resumption orders.    10/05/23 0742  TOC Brief Assessment  Insurance and Status Reviewed  Patient has primary care physician Yes  Home environment has been reviewed Lives with significant other.  Prior level of function: Some assist.  Prior/Current Home Services Current home services ([facility] HHRN/PT)  Social Determinants of Health Reivew SDOH reviewed no interventions necessary  Readmission risk has been reviewed Yes  Transition of care needs transition of care needs identified, TOC will continue to follow

## 2023-10-05 NOTE — ED Notes (Signed)
Provider at bedside

## 2023-10-05 NOTE — Assessment & Plan Note (Signed)
Continue Crestor

## 2023-10-05 NOTE — Plan of Care (Signed)
  Problem: Acute Rehab PT Goals(only PT should resolve) Goal: Pt Will Go Supine/Side To Sit Outcome: Progressing Flowsheets (Taken 10/05/2023 1540) Pt will go Supine/Side to Sit:  with minimal assist  with moderate assist Goal: Patient Will Transfer Sit To/From Stand Outcome: Progressing Flowsheets (Taken 10/05/2023 1540) Patient will transfer sit to/from stand: with moderate assist Goal: Pt Will Transfer Bed To Chair/Chair To Bed Outcome: Progressing Flowsheets (Taken 10/05/2023 1540) Pt will Transfer Bed to Chair/Chair to Bed: with mod assist Goal: Pt Will Ambulate Outcome: Progressing Flowsheets (Taken 10/05/2023 1540) Pt will Ambulate:  10 feet  with moderate assist  with rolling walker  with other equipment (comment) Note: Platform Walker   3:41 PM, 10/05/23 Ocie Bob, MPT Physical Therapist with [facility] [phone] office [phone] mobile phone

## 2023-10-05 NOTE — Assessment & Plan Note (Signed)
-   Continue Abilify, Effexor
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
## 2023-10-05 NOTE — ED Notes (Signed)
Pt had a BM, family preferred to clean pt up themselves, after help was offered.

## 2023-10-05 NOTE — Progress Notes (Signed)
Patient placed on full face mask BIPAP 14/6.  Patient tolerating well at this time.    10/05/23 0130  BiPAP/CPAP/SIPAP  $ Non-Invasive Ventilator  Non-Invasive Vent Set Up  $ Non-Invasive Home Ventilator  Initial  $ Face Mask Large  Yes  BiPAP/CPAP/SIPAP Pt Type Adult  BiPAP/CPAP/SIPAP DREAMSTATIOND  Mask Type Full face mask  Mask Size Large  IPAP 14 cmH20  EPAP 6 cmH2O  FiO2 (%) 21 %  Patient Home Equipment No  Auto Titrate No  BiPAP/CPAP /SiPAP Vitals  Pulse Rate 75  Resp 17  BP (!) 135/58  SpO2 96 %  MEWS Score/Color  MEWS Score 0  MEWS Score Color [place] Si

## 2023-10-05 NOTE — Assessment & Plan Note (Signed)
-   Leukocytosis of 13.2 - Procalcitonin is undetectable - UA is borderline, but patient has no urinary symptoms - Chest x-ray shows cardiomegaly and vascular congestion - No other signs of infection - Trend in the a.m.
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
## 2023-10-05 NOTE — Assessment & Plan Note (Signed)
- 

## 2023-10-05 NOTE — Progress Notes (Signed)
SLP Cancellation Note  Patient Details Name: Cody Velez MRN: [id number] DOB: 07/18/1946   Cancelled treatment:       Reason Eval/Treat Not Completed: Other (comment) (Pt passed the Yale swallow screen and diet has been ordered. RN reports no concerns. Will defer BSE at this time. MRI negative for acute changes. Reconsult if needed.)  Thank you,  Havery Moros, CCC-SLP [phone]  Yasira Engelson 10/05/2023, 12:02 PM

## 2023-10-05 NOTE — Assessment & Plan Note (Signed)
-   Continue Zebeta, Entresto, Aldactone
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
## 2023-10-05 NOTE — Assessment & Plan Note (Signed)
-   Avoid QT prolonging agents when possible - Monitor electrolytes and replace as indicated - Monitor on telemetry

## 2023-10-05 NOTE — Assessment & Plan Note (Signed)
-   Likely multifactorial - Concern for polypharmacy given the patient is on oxycodone, hydrocodone, gabapentin - Neurology recommended reducing doses of opiate and gabapentin which has been done and the orders - UDS was positive for opiates - CT did not show acute intracranial abnormality - MRI brain in the a.m. - CT C-spine showed questionable nondisplaced type II dens fracture - MRI C-spine in the a.m. - Ammonia 21 - Continue to monitor

## 2023-10-06 ENCOUNTER — Ambulatory Visit: Payer: Medicare Other | Admitting: Student

## 2023-10-06 DIAGNOSIS — R29818 Other symptoms and signs involving the nervous system: Secondary | ICD-10-CM | POA: Diagnosis not present

## 2023-10-06 LAB — BASIC METABOLIC PANEL
Anion gap: 10 (ref 5–15)
BUN: 10 mg/dL (ref 8–23)
CO2: 22 mmol/L (ref 22–32)
Calcium: 8.5 mg/dL — ABNORMAL LOW (ref 8.9–10.3)
Chloride: 103 mmol/L (ref 98–111)
Creatinine, Ser: 0.89 mg/dL (ref 0.61–1.24)
GFR, Estimated: >60 mL/min (ref >60)
Glucose, Bld: 89 mg/dL (ref 70–99)
Potassium: 3.4 mmol/L — ABNORMAL LOW (ref 3.5–5.1)
Sodium: 135 mmol/L (ref 135–145)

## 2023-10-06 LAB — CBC
HCT: 46.5 % (ref 39.0–52.0)
Hemoglobin: 13.1 g/dL (ref 13.0–17.0)
MCH: 24.4 pg — ABNORMAL LOW (ref 26.0–34.0)
MCHC: 28.2 g/dL — ABNORMAL LOW (ref 30.0–36.0)
MCV: 86.8 fL (ref 80.0–100.0)
Platelets: 497 10*3/uL — ABNORMAL HIGH (ref 150–400)
RBC: 5.36 MIL/uL (ref 4.22–5.81)
RDW: 16.9 % — ABNORMAL HIGH (ref 11.5–15.5)
WBC: 9.4 10*3/uL (ref 4.0–10.5)
nRBC: 0 % (ref 0.0–0.2)

## 2023-10-06 MED ORDER — GABAPENTIN 100 MG PO CAPS: 200.0000 mg | ORAL_CAPSULE | Freq: Three times a day (TID) | ORAL | 0 refills | Status: DC

## 2023-10-06 MED ORDER — POTASSIUM CHLORIDE CRYS ER 20 MEQ PO TBCR
40.0000 meq | EXTENDED_RELEASE_TABLET | ORAL | Status: AC
  Administered 2023-10-06 (×2): 40 meq via ORAL
  Filled 2023-10-06 (×2): qty 2

## 2023-10-06 MED ORDER — ACETAMINOPHEN 325 MG PO TABS: 650.0000 mg | ORAL_TABLET | Freq: Four times a day (QID) | ORAL | Status: AC | PRN

## 2023-10-06 MED ORDER — OXYCODONE HCL 10 MG PO TABS: 10.0000 mg | ORAL_TABLET | Freq: Four times a day (QID) | ORAL | 0 refills | Status: DC | PRN

## 2023-10-06 MED ORDER — TRAZODONE HCL 100 MG PO TABS: 50.0000 mg | ORAL_TABLET | Freq: Every day | ORAL | 0 refills | Status: DC

## 2023-10-06 MED ORDER — OXYCODONE HCL 10 MG PO TABS: 10.0000 mg | ORAL_TABLET | ORAL | 0 refills | Status: DC | PRN
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
## 2023-10-06 NOTE — Plan of Care (Signed)
  Problem: Acute Rehab OT Goals (only OT should resolve) Goal: Pt. Will Perform Grooming Flowsheets (Taken 10/06/2023 0951) Pt Will Perform Grooming:  with modified independence  sitting Goal: Pt. Will Perform Lower Body Bathing Flowsheets (Taken 10/06/2023 0951) Pt Will Perform Lower Body Bathing:  with min assist  with contact guard assist  sitting/lateral leans Goal: Pt. Will Perform Upper Body Dressing Flowsheets (Taken 10/06/2023 0951) Pt Will Perform Upper Body Dressing:  with modified independence  sitting Goal: Pt. Will Perform Lower Body Dressing Flowsheets (Taken 10/06/2023 0951) Pt Will Perform Lower Body Dressing:  with min assist  sitting/lateral leans  with contact guard assist Goal: Pt. Will Transfer To Toilet Flowsheets (Taken 10/06/2023 [id number]) Pt Will Transfer to Toilet:  with min assist  squat pivot transfer Goal: Pt. Will Perform Toileting-Clothing Manipulation Flowsheets (Taken 10/06/2023 0951) Pt Will Perform Toileting - Clothing Manipulation and hygiene:  with contact guard assist  with min assist  sitting/lateral leans  Anelia Carriveau OT, MOT

## 2023-10-06 NOTE — Progress Notes (Signed)
[facility] notification completed. Notification ID: [id number].

## 2023-10-06 NOTE — TOC Transition Note (Signed)
Transition of Care [facility]) - CM/SW Discharge Note   Patient Details  Name: Cody Velez MRN: [id number] Date of Birth: June 03, 1946  Transition of Care [facility]) CM/SW Contact:  Karn Cassis, LCSW Phone Number: 10/06/2023, 3:16 PM   Clinical Narrative: [facility] can take pt today. MD notified and will d/c. LCSW left voicemail for pt's daughter Selena Batten requesting return call. RN given number to call report. Will transport via [employer]. TOC to arrange when d/c summary complete. Will send d/c summary upon completion.        Final next level of care:  [facility]) Barriers to Discharge: Barriers Resolved   Patient Goals and CMS Choice   Choice offered to / list presented to : Patient (Significant other)  Discharge Placement                         Discharge Plan and Services Additional resources added to the After Visit Summary for   In-house Referral: Clinical Social Work   Post Acute Care Choice: Skilled Nursing Facility                               Social Determinants of Health (SDOH) Interventions SDOH Screenings   Food Insecurity: No Food Insecurity (10/05/2023)  Housing: Low Risk  (10/05/2023)  Transportation Needs: No Transportation Needs (10/05/2023)  Utilities: Not At Risk (10/05/2023)  Depression (PHQ2-9): Low Risk  (10/30/2020)  Financial Resource Strain: Low Risk  (07/14/2023)   Received from [facility]  Social Connections: Unknown (05/03/2022)   Received from [facility]  Tobacco Use: High Risk (10/05/2023)     Readmission Risk Interventions     No data to display

## 2023-10-06 NOTE — Discharge Summary (Addendum)
Cody Velez, is a 77 y.o. male  DOB 10/07/1946  MRN [id number].  Admission date:  10/04/2023  Admitting Physician  Lilyan Gilford, DO  Discharge Date:  10/06/2023   Primary MD  Carylon Perches, MD  Recommendations for primary care physician for things to follow:   1)Avoid ibuprofen/Advil/Aleve/Motrin/Goody Powders/Naproxen/BC powders/Meloxicam/Diclofenac/Indomethacin and other Nonsteroidal anti-inflammatory medications as these will make you more likely to bleed and can cause stomach ulcers, can also cause Kidney problems.   2)Repeat CBC and BMP Blood tests on Monday 10/10/23  3)Follow-up with orthopedic surgeon for left upper extremity fracture -Follow up with Orthopedic Surgeon---Dr. Fuller Canada, MD---in 2 to 3 weeks  -Address: [address], [place], [place] [address] Phone: [phone]  Admission Diagnosis  Generalized weakness [R53.1] Acute focal neurological deficit [R29.818]   Discharge Diagnosis  Generalized weakness [R53.1] Acute focal neurological deficit [R29.818]    Principal Problem:   Acute focal neurological deficit Active Problems:   HYPERCHOLESTEROLEMIA   SMOKELESS TOBACCO ABUSE   Essential hypertension, benign   GERD   Acute metabolic encephalopathy   PTSD (post-traumatic stress disorder)   Prolonged QT interval   Leukocytosis   Chronic systolic CHF (congestive heart failure) (HCC)      Past Medical History:  Diagnosis Date   Allergic rhinitis    Arthritis    B12 deficiency    Cervicogenic headache    Childhood asthma    Chronic back pain    Coronary atherosclerosis of native coronary artery    Mild nonobstructive CAD at catheterization January 2015   D[facility])    Depression    Essential hypertension    Falls    GERD (gastroesophageal reflux disease)    Glaucoma    History of blood transfusion    History of cerebrovascular disease 07/23/2015   History  of kidney stones    History of pneumonia 02/2011   Hyperlipidemia    OSA (obstructive sleep apnea)    CPAP - Dr. Shelle Iron - uses cpap every night   Pneumonia    Prostate cancer [facility])    PTSD (post-traumatic stress disorder)    [place]   Rectal bleeding    Stroke (HCC) 03/2021   Wheelchair bound     Past Surgical History:  Procedure Laterality Date   APPLICATION OF ROBOTIC ASSISTANCE FOR SPINAL PROCEDURE N/A 03/28/2018   Procedure: APPLICATION OF ROBOTIC ASSISTANCE FOR SPINAL PROCEDURE;  Surgeon: Barnett Abu, MD;  Location: [facility] OR;  Service: Neurosurgery;  Laterality: N/A;   BACK SURGERY  02/14/2012   lumbar OR #7; "today redid L1L2; replaced screws; added bone from hip"   BILATERAL KNEE ARTHROSCOPY     COLONOSCOPY  10/15/2008   Dr. Jena Gauss: tubular adenoma    COLONOSCOPY  12/17/2003   OZH:YQMVHQ rectal and colon   COLONOSCOPY N/A 09/05/2014   tubular adenoma   COLONOSCOPY WITH PROPOFOL N/A 09/15/2022   Multiple 3-25 mm polyps in ascending colon and cecum, markedly elongated and redundant colon. One polyp removed piecemeal fashion. 6 month surveillance recommended. Tubular adenomas.  By: Jasmine Pang M.D.   On: 10/04/2023 21:56   CT HEAD CODE STROKE WO CONTRAST  Result Date: 10/04/2023 CLINICAL DATA:  Code stroke. Slurred speech, leading to right side, generalized weakness EXAM: CT HEAD  WITHOUT CONTRAST TECHNIQUE: Contiguous axial images were obtained from the base of the skull through the vertex without intravenous contrast. RADIATION DOSE REDUCTION: This exam was performed according to the departmental dose-optimization program which includes automated exposure control, adjustment of the mA and/or kV according to patient size and/or use of iterative reconstruction technique. COMPARISON:  06/16/2023 FINDINGS: Brain: No evidence of acute infarction, hemorrhage, mass, mass effect, or midline shift. No hydrocephalus or extra-axial collection. Mildly advanced cerebral atrophy for age, with remote lacunar infarcts in the basal ganglia and pons. Periventricular white matter changes, likely the sequela of chronic small vessel ischemic disease. Vascular: No hyperdense vessel. Skull: Negative for fracture or focal lesion. Sinuses/Orbits: No acute finding. Other: The mastoid air cells are well aerated. ASPECTS [facility] Stroke Program Early CT Score) - Ganglionic level infarction (caudate, lentiform nuclei, internal capsule, insula, M1-M3 cortex): 7 - Supraganglionic infarction (M4-M6 cortex): 3 Total score (0-10 with 10 being normal): 10 IMPRESSION: 1. No acute intracranial process. 2. ASPECTS is 10. Code stroke imaging results were communicated on 10/04/2023 at 7:05 pm to provider GOLDSTON via telephone, who verbally acknowledged these results. Electronically Signed   By: Wiliam Ke M.D.   On: 10/04/2023 19:05   DG Wrist Complete Left  Result Date: 10/03/2023 CLINICAL DATA:  Left wrist pain and swelling after fall this afternoon EXAM: LEFT WRIST - COMPLETE 3+ VIEW COMPARISON:  02/16/2019 FINDINGS: Mildly displaced distal radial fracture primarily involving the metaphysis. Possible radiocarpal involvement. No additional acute fracture. There is chondrocalcinosis in the region of the triangular fibrocartilage, chronic. Corticated density adjacent to the ulna styloid, chronic. Lateral view is limited  due to positioning. Generalized soft tissue edema. IMPRESSION: Mildly displaced distal radial fracture primarily involving the metaphysis. Electronically Signed   By: Narda Rutherford M.D.   On: 10/03/2023 17:22   ECHOCARDIOGRAM COMPLETE  Result Date: 10/03/2023    ECHOCARDIOGRAM REPORT   Patient Name:   Cody Velez Date of Exam: 10/03/2023 Medical Rec #:  [id number]         Height:       75.0 in Accession #:    [id number]        Weight:       270.0 lb Date of Birth:  04/09/1946          BSA:          2.493 m Patient Age:    77 years          BP:           120/77 mmHg Patient Gender: M                 HR:           77 bpm. [facility] Procedure: 2D Echo, Cardiac Doppler and Color Doppler Indications:    I42.9 Cardiomyopathy (unspecified)  History:        Patient has prior history of Echocardiogram examinations, most                 recent 03/21/2018. CHF, CAD, Abnormal ECG, Stroke,                 Arrythmias:Bradycardia, Signs/Symptoms:Bacteremia; Risk  By: Jasmine Pang M.D.   On: 10/04/2023 21:56   CT HEAD CODE STROKE WO CONTRAST  Result Date: 10/04/2023 CLINICAL DATA:  Code stroke. Slurred speech, leading to right side, generalized weakness EXAM: CT HEAD  WITHOUT CONTRAST TECHNIQUE: Contiguous axial images were obtained from the base of the skull through the vertex without intravenous contrast. RADIATION DOSE REDUCTION: This exam was performed according to the departmental dose-optimization program which includes automated exposure control, adjustment of the mA and/or kV according to patient size and/or use of iterative reconstruction technique. COMPARISON:  06/16/2023 FINDINGS: Brain: No evidence of acute infarction, hemorrhage, mass, mass effect, or midline shift. No hydrocephalus or extra-axial collection. Mildly advanced cerebral atrophy for age, with remote lacunar infarcts in the basal ganglia and pons. Periventricular white matter changes, likely the sequela of chronic small vessel ischemic disease. Vascular: No hyperdense vessel. Skull: Negative for fracture or focal lesion. Sinuses/Orbits: No acute finding. Other: The mastoid air cells are well aerated. ASPECTS [facility] Stroke [facility] Early CT Score) - Ganglionic level infarction (caudate, lentiform nuclei, internal capsule, insula, M1-M3 cortex): 7 - Supraganglionic infarction (M4-M6 cortex): 3 Total score (0-10 with 10 being normal): 10 IMPRESSION: 1. No acute intracranial process. 2. ASPECTS is 10. Code stroke imaging results were communicated on 10/04/2023 at 7:05 pm to provider GOLD[place]ON via telephone, who verbally acknowledged these results. Electronically Signed   By: Wiliam Ke M.D.   On: 10/04/2023 19:05   DG Wrist Complete Left  Result Date: 10/03/2023 CLINICAL DATA:  Left wrist pain and swelling after fall this afternoon EXAM: LEFT WRIST - COMPLETE 3+ VIEW COMPARISON:  02/16/2019 FINDINGS: Mildly displaced distal radial fracture primarily involving the metaphysis. Possible radiocarpal involvement. No additional acute fracture. There is chondrocalcinosis in the region of the triangular fibrocartilage, chronic. Corticated density adjacent to the ulna styloid, chronic. Lateral view is limited  due to positioning. Generalized soft tissue edema. IMPRESSION: Mildly displaced distal radial fracture primarily involving the metaphysis. Electronically Signed   By: Narda Rutherford M.D.   On: 10/03/2023 17:22   ECHOCARDIOGRAM COMPLETE  Result Date: 10/03/2023    ECHOCARDIOGRAM REPORT   Patient Name:   Cody Velez Date of Exam: 10/03/2023 Medical Rec #:  [id number]         Height:       75.0 in Accession #:    [id number]        Weight:       270.0 lb Date of Birth:  04/09/1946          BSA:          2.493 m Patient Age:    77 years          BP:           120/77 mmHg Patient Gender: M                 HR:           77 bpm. [facility] Procedure: 2D Echo, Cardiac Doppler and Color Doppler Indications:    I42.9 Cardiomyopathy (unspecified)  History:        Patient has prior history of Echocardiogram examinations, most                 recent 03/21/2018. CHF, CAD, Abnormal ECG, Stroke,                 Arrythmias:Bradycardia, Signs/Symptoms:Bacteremia; Risk  Cody Velez, is a 77 y.o. male  DOB 10/07/1946  MRN [id number].  Admission date:  10/04/2023  Admitting Physician  Lilyan Gilford, DO  Discharge Date:  10/06/2023   Primary MD  Carylon Perches, MD  Recommendations for primary care physician for things to follow:   1)Avoid ibuprofen/Advil/Aleve/Motrin/Goody Powders/Naproxen/BC powders/Meloxicam/Diclofenac/Indomethacin and other Nonsteroidal anti-inflammatory medications as these will make you more likely to bleed and can cause stomach ulcers, can also cause Kidney problems.   2)Repeat CBC and BMP Blood tests on Monday 10/10/23  3)Follow-up with orthopedic surgeon for left upper extremity fracture -Follow up with Orthopedic Surgeon---Dr. Fuller Canada, MD---in 2 to 3 weeks  -Address: [address], [place], [place] [address] Phone: [phone]  Admission Diagnosis  Generalized weakness [R53.1] Acute focal neurological deficit [R29.818]   Discharge Diagnosis  Generalized weakness [R53.1] Acute focal neurological deficit [R29.818]    Principal Problem:   Acute focal neurological deficit Active Problems:   HYPERCHOLESTEROLEMIA   SMOKELESS TOBACCO ABUSE   Essential hypertension, benign   GERD   Acute metabolic encephalopathy   PTSD (post-traumatic stress disorder)   Prolonged QT interval   Leukocytosis   Chronic systolic CHF (congestive heart failure) (HCC)      Past Medical History:  Diagnosis Date   Allergic rhinitis    Arthritis    B12 deficiency    Cervicogenic headache    Childhood asthma    Chronic back pain    Coronary atherosclerosis of native coronary artery    Mild nonobstructive CAD at catheterization January 2015   [facility])    Depression    Essential hypertension    Falls    GERD (gastroesophageal reflux disease)    Glaucoma    History of blood transfusion    History of cerebrovascular disease 07/23/2015   History  of kidney stones    History of pneumonia 02/2011   Hyperlipidemia    OSA (obstructive sleep apnea)    CPAP - Dr. Shelle Iron - uses cpap every night   Pneumonia    Prostate cancer [facility])    PTSD (post-traumatic stress disorder)    [place]   Rectal bleeding    Stroke (HCC) 03/2021   Wheelchair bound     Past Surgical History:  Procedure Laterality Date   APPLICATION OF ROBOTIC ASSISTANCE FOR SPINAL PROCEDURE N/A 03/28/2018   Procedure: APPLICATION OF ROBOTIC ASSISTANCE FOR SPINAL PROCEDURE;  Surgeon: Barnett Abu, MD;  Location: [facility] OR;  Service: Neurosurgery;  Laterality: N/A;   BACK SURGERY  02/14/2012   lumbar OR #7; "today redid L1L2; replaced screws; added bone from hip"   BILATERAL KNEE ARTHROSCOPY     COLONOSCOPY  10/15/2008   Dr. Jena Gauss: tubular adenoma    COLONOSCOPY  12/17/2003   OZH:YQMVHQ rectal and colon   COLONOSCOPY N/A 09/05/2014   tubular adenoma   COLONOSCOPY WITH PROPOFOL N/A 09/15/2022   Multiple 3-25 mm polyps in ascending colon and cecum, markedly elongated and redundant colon. One polyp removed piecemeal fashion. 6 month surveillance recommended. Tubular adenomas.  By: Jasmine Pang M.D.   On: 10/04/2023 21:56   CT HEAD CODE STROKE WO CONTRAST  Result Date: 10/04/2023 CLINICAL DATA:  Code stroke. Slurred speech, leading to right side, generalized weakness EXAM: CT HEAD  WITHOUT CONTRAST TECHNIQUE: Contiguous axial images were obtained from the base of the skull through the vertex without intravenous contrast. RADIATION DOSE REDUCTION: This exam was performed according to the departmental dose-optimization program which includes automated exposure control, adjustment of the mA and/or kV according to patient size and/or use of iterative reconstruction technique. COMPARISON:  06/16/2023 FINDINGS: Brain: No evidence of acute infarction, hemorrhage, mass, mass effect, or midline shift. No hydrocephalus or extra-axial collection. Mildly advanced cerebral atrophy for age, with remote lacunar infarcts in the basal ganglia and pons. Periventricular white matter changes, likely the sequela of chronic small vessel ischemic disease. Vascular: No hyperdense vessel. Skull: Negative for fracture or focal lesion. Sinuses/Orbits: No acute finding. Other: The mastoid air cells are well aerated. ASPECTS [facility] Stroke [facility] Early CT Score) - Ganglionic level infarction (caudate, lentiform nuclei, internal capsule, insula, M1-M3 cortex): 7 - Supraganglionic infarction (M4-M6 cortex): 3 Total score (0-10 with 10 being normal): 10 IMPRESSION: 1. No acute intracranial process. 2. ASPECTS is 10. Code stroke imaging results were communicated on 10/04/2023 at 7:05 pm to provider [place] via telephone, who verbally acknowledged these results. Electronically Signed   By: Wiliam Ke M.D.   On: 10/04/2023 19:05   DG Wrist Complete Left  Result Date: 10/03/2023 CLINICAL DATA:  Left wrist pain and swelling after fall this afternoon EXAM: LEFT WRIST - COMPLETE 3+ VIEW COMPARISON:  02/16/2019 FINDINGS: Mildly displaced distal radial fracture primarily involving the metaphysis. Possible radiocarpal involvement. No additional acute fracture. There is chondrocalcinosis in the region of the triangular fibrocartilage, chronic. Corticated density adjacent to the ulna styloid, chronic. Lateral view is limited  due to positioning. Generalized soft tissue edema. IMPRESSION: Mildly displaced distal radial fracture primarily involving the metaphysis. Electronically Signed   By: Narda Rutherford M.D.   On: 10/03/2023 17:22   ECHOCARDIOGRAM COMPLETE  Result Date: 10/03/2023    ECHOCARDIOGRAM REPORT   Patient Name:   Cody Velez Date of Exam: 10/03/2023 Medical Rec #:  [id number]         Height:       75.0 in Accession #:    [id number]        Weight:       270.0 lb Date of Birth:  04/09/1946          BSA:          2.493 m Patient Age:    77 years          BP:           120/77 mmHg Patient Gender: M                 HR:           77 bpm. [facility] Procedure: 2D Echo, Cardiac Doppler and Color Doppler Indications:    I42.9 Cardiomyopathy (unspecified)  History:        Patient has prior history of Echocardiogram examinations, most                 recent 03/21/2018. CHF, CAD, Abnormal ECG, Stroke,                 Arrythmias:Bradycardia, Signs/Symptoms:Bacteremia; Risk  By: Jasmine Pang M.D.   On: 10/04/2023 21:56   CT HEAD CODE STROKE WO CONTRAST  Result Date: 10/04/2023 CLINICAL DATA:  Code stroke. Slurred speech, leading to right side, generalized weakness EXAM: CT HEAD  WITHOUT CONTRAST TECHNIQUE: Contiguous axial images were obtained from the base of the skull through the vertex without intravenous contrast. RADIATION DOSE REDUCTION: This exam was performed according to the departmental dose-optimization program which includes automated exposure control, adjustment of the mA and/or kV according to patient size and/or use of iterative reconstruction technique. COMPARISON:  06/16/2023 FINDINGS: Brain: No evidence of acute infarction, hemorrhage, mass, mass effect, or midline shift. No hydrocephalus or extra-axial collection. Mildly advanced cerebral atrophy for age, with remote lacunar infarcts in the basal ganglia and pons. Periventricular white matter changes, likely the sequela of chronic small vessel ischemic disease. Vascular: No hyperdense vessel. Skull: Negative for fracture or focal lesion. Sinuses/Orbits: No acute finding. Other: The mastoid air cells are well aerated. ASPECTS [facility] Stroke Program Early CT Score) - Ganglionic level infarction (caudate, lentiform nuclei, internal capsule, insula, M1-M3 cortex): 7 - Supraganglionic infarction (M4-M6 cortex): 3 Total score (0-10 with 10 being normal): 10 IMPRESSION: 1. No acute intracranial process. 2. ASPECTS is 10. Code stroke imaging results were communicated on 10/04/2023 at 7:05 pm to provider [place] via telephone, who verbally acknowledged these results. Electronically Signed   By: Wiliam Ke M.D.   On: 10/04/2023 19:05   DG Wrist Complete Left  Result Date: 10/03/2023 CLINICAL DATA:  Left wrist pain and swelling after fall this afternoon EXAM: LEFT WRIST - COMPLETE 3+ VIEW COMPARISON:  02/16/2019 FINDINGS: Mildly displaced distal radial fracture primarily involving the metaphysis. Possible radiocarpal involvement. No additional acute fracture. There is chondrocalcinosis in the region of the triangular fibrocartilage, chronic. Corticated density adjacent to the ulna styloid, chronic. Lateral view is limited  due to positioning. Generalized soft tissue edema. IMPRESSION: Mildly displaced distal radial fracture primarily involving the metaphysis. Electronically Signed   By: Narda Rutherford M.D.   On: 10/03/2023 17:22   ECHOCARDIOGRAM COMPLETE  Result Date: 10/03/2023    ECHOCARDIOGRAM REPORT   Patient Name:   Cody Velez Date of Exam: 10/03/2023 Medical Rec #:  [id number]         Height:       75.0 in Accession #:    [id number]        Weight:       270.0 lb Date of Birth:  04/09/1946          BSA:          2.493 m Patient Age:    77 years          BP:           120/77 mmHg Patient Gender: M                 HR:           77 bpm. [facility] Procedure: 2D Echo, Cardiac Doppler and Color Doppler Indications:    I42.9 Cardiomyopathy (unspecified)  History:        Patient has prior history of Echocardiogram examinations, most                 recent 03/21/2018. CHF, CAD, Abnormal ECG, Stroke,                 Arrythmias:Bradycardia, Signs/Symptoms:Bacteremia; Risk  Cody Velez, is a 77 y.o. male  DOB 10/07/1946  MRN [id number].  Admission date:  10/04/2023  Admitting Physician  Lilyan Gilford, DO  Discharge Date:  10/06/2023   Primary MD  Carylon Perches, MD  Recommendations for primary care physician for things to follow:   1)Avoid ibuprofen/Advil/Aleve/Motrin/Goody Powders/Naproxen/BC powders/Meloxicam/Diclofenac/Indomethacin and other Nonsteroidal anti-inflammatory medications as these will make you more likely to bleed and can cause stomach ulcers, can also cause Kidney problems.   2)Repeat CBC and BMP Blood tests on Monday 10/10/23  3)Follow-up with orthopedic surgeon for left upper extremity fracture -Follow up with Orthopedic Surgeon---Dr. Fuller Canada, MD---in 2 to 3 weeks  -Address: [address], [place], [place] [address] Phone: [phone]  Admission Diagnosis  Generalized weakness [R53.1] Acute focal neurological deficit [R29.818]   Discharge Diagnosis  Generalized weakness [R53.1] Acute focal neurological deficit [R29.818]    Principal Problem:   Acute focal neurological deficit Active Problems:   HYPERCHOLESTEROLEMIA   SMOKELESS TOBACCO ABUSE   Essential hypertension, benign   GERD   Acute metabolic encephalopathy   PTSD (post-traumatic stress disorder)   Prolonged QT interval   Leukocytosis   Chronic systolic CHF (congestive heart failure) (HCC)      Past Medical History:  Diagnosis Date   Allergic rhinitis    Arthritis    B12 deficiency    Cervicogenic headache    Childhood asthma    Chronic back pain    Coronary atherosclerosis of native coronary artery    Mild nonobstructive CAD at catheterization January 2015   [facility])    Depression    Essential hypertension    Falls    GERD (gastroesophageal reflux disease)    Glaucoma    History of blood transfusion    History of cerebrovascular disease 07/23/2015   History  of kidney stones    History of pneumonia 02/2011   Hyperlipidemia    OSA (obstructive sleep apnea)    CPAP - Dr. Shelle Iron - uses cpap every night   Pneumonia    Prostate cancer [facility])    PTSD (post-traumatic stress disorder)    [place]   Rectal bleeding    Stroke (HCC) 03/2021   Wheelchair bound     Past Surgical History:  Procedure Laterality Date   APPLICATION OF ROBOTIC ASSISTANCE FOR SPINAL PROCEDURE N/A 03/28/2018   Procedure: APPLICATION OF ROBOTIC ASSISTANCE FOR SPINAL PROCEDURE;  Surgeon: Barnett Abu, MD;  Location: [facility] OR;  Service: Neurosurgery;  Laterality: N/A;   BACK SURGERY  02/14/2012   lumbar OR #7; "today redid L1L2; replaced screws; added bone from hip"   BILATERAL KNEE ARTHROSCOPY     COLONOSCOPY  10/15/2008   Dr. Jena Gauss: tubular adenoma    COLONOSCOPY  12/17/2003   OZH:YQMVHQ rectal and colon   COLONOSCOPY N/A 09/05/2014   tubular adenoma   COLONOSCOPY WITH PROPOFOL N/A 09/15/2022   Multiple 3-25 mm polyps in ascending colon and cecum, markedly elongated and redundant colon. One polyp removed piecemeal fashion. 6 month surveillance recommended. Tubular adenomas.  Cody Velez, is a 77 y.o. male  DOB 10/07/1946  MRN [id number].  Admission date:  10/04/2023  Admitting Physician  Lilyan Gilford, DO  Discharge Date:  10/06/2023   Primary MD  Carylon Perches, MD  Recommendations for primary care physician for things to follow:   1)Avoid ibuprofen/Advil/Aleve/Motrin/Goody Powders/Naproxen/BC powders/Meloxicam/Diclofenac/Indomethacin and other Nonsteroidal anti-inflammatory medications as these will make you more likely to bleed and can cause stomach ulcers, can also cause Kidney problems.   2)Repeat CBC and BMP Blood tests on Monday 10/10/23  3)Follow-up with orthopedic surgeon for left upper extremity fracture -Follow up with Orthopedic Surgeon---Dr. Fuller Canada, MD---in 2 to 3 weeks  -Address: [address], [place], [place] [address] Phone: [phone]  Admission Diagnosis  Generalized weakness [R53.1] Acute focal neurological deficit [R29.818]   Discharge Diagnosis  Generalized weakness [R53.1] Acute focal neurological deficit [R29.818]    Principal Problem:   Acute focal neurological deficit Active Problems:   HYPERCHOLESTEROLEMIA   SMOKELESS TOBACCO ABUSE   Essential hypertension, benign   GERD   Acute metabolic encephalopathy   PTSD (post-traumatic stress disorder)   Prolonged QT interval   Leukocytosis   Chronic systolic CHF (congestive heart failure) (HCC)      Past Medical History:  Diagnosis Date   Allergic rhinitis    Arthritis    B12 deficiency    Cervicogenic headache    Childhood asthma    Chronic back pain    Coronary atherosclerosis of native coronary artery    Mild nonobstructive CAD at catheterization January 2015   [facility])    Depression    Essential hypertension    Falls    GERD (gastroesophageal reflux disease)    Glaucoma    History of blood transfusion    History of cerebrovascular disease 07/23/2015   History  of kidney stones    History of pneumonia 02/2011   Hyperlipidemia    OSA (obstructive sleep apnea)    CPAP - Dr. Shelle Iron - uses cpap every night   Pneumonia    Prostate cancer [facility])    PTSD (post-traumatic stress disorder)    [place]   Rectal bleeding    Stroke (HCC) 03/2021   Wheelchair bound     Past Surgical History:  Procedure Laterality Date   APPLICATION OF ROBOTIC ASSISTANCE FOR SPINAL PROCEDURE N/A 03/28/2018   Procedure: APPLICATION OF ROBOTIC ASSISTANCE FOR SPINAL PROCEDURE;  Surgeon: Barnett Abu, MD;  Location: [facility];  Service: Neurosurgery;  Laterality: N/A;   BACK SURGERY  02/14/2012   lumbar OR #7; "today redid L1L2; replaced screws; added bone from hip"   BILATERAL KNEE ARTHROSCOPY     COLONOSCOPY  10/15/2008   Dr. Jena Gauss: tubular adenoma    COLONOSCOPY  12/17/2003   OZH:YQMVHQ rectal and colon   COLONOSCOPY N/A 09/05/2014   tubular adenoma   COLONOSCOPY WITH PROPOFOL N/A 09/15/2022   Multiple 3-25 mm polyps in ascending colon and cecum, markedly elongated and redundant colon. One polyp removed piecemeal fashion. 6 month surveillance recommended. Tubular adenomas.  Cody Velez, is a 77 y.o. male  DOB 10/07/1946  MRN [id number].  Admission date:  10/04/2023  Admitting Physician  Lilyan Gilford, DO  Discharge Date:  10/06/2023   Primary MD  Carylon Perches, MD  Recommendations for primary care physician for things to follow:   1)Avoid ibuprofen/Advil/Aleve/Motrin/Goody Powders/Naproxen/BC powders/Meloxicam/Diclofenac/Indomethacin and other Nonsteroidal anti-inflammatory medications as these will make you more likely to bleed and can cause stomach ulcers, can also cause Kidney problems.   2)Repeat CBC and BMP Blood tests on Monday 10/10/23  3)Follow-up with orthopedic surgeon for left upper extremity fracture -Follow up with Orthopedic Surgeon---Dr. Fuller Canada, MD---in 2 to 3 weeks  -Address: [address], [place], [place] [address] Phone: [phone]  Admission Diagnosis  Generalized weakness [R53.1] Acute focal neurological deficit [R29.818]   Discharge Diagnosis  Generalized weakness [R53.1] Acute focal neurological deficit [R29.818]    Principal Problem:   Acute focal neurological deficit Active Problems:   HYPERCHOLESTEROLEMIA   SMOKELESS TOBACCO ABUSE   Essential hypertension, benign   GERD   Acute metabolic encephalopathy   PTSD (post-traumatic stress disorder)   Prolonged QT interval   Leukocytosis   Chronic systolic CHF (congestive heart failure) (HCC)      Past Medical History:  Diagnosis Date   Allergic rhinitis    Arthritis    B12 deficiency    Cervicogenic headache    Childhood asthma    Chronic back pain    Coronary atherosclerosis of native coronary artery    Mild nonobstructive CAD at catheterization January 2015   Dementia [facility])    Depression    Essential hypertension    Falls    GERD (gastroesophageal reflux disease)    Glaucoma    History of blood transfusion    History of cerebrovascular disease 07/23/2015   History  of kidney stones    History of pneumonia 02/2011   Hyperlipidemia    OSA (obstructive sleep apnea)    CPAP - Dr. Shelle Iron - uses cpap every night   Pneumonia    Prostate cancer [facility])    PTSD (post-traumatic stress disorder)    [place]   Rectal bleeding    Stroke (HCC) 03/2021   Wheelchair bound     Past Surgical History:  Procedure Laterality Date   APPLICATION OF ROBOTIC ASSISTANCE FOR SPINAL PROCEDURE N/A 03/28/2018   Procedure: APPLICATION OF ROBOTIC ASSISTANCE FOR SPINAL PROCEDURE;  Surgeon: Barnett Abu, MD;  Location: [facility] OR;  Service: Neurosurgery;  Laterality: N/A;   BACK SURGERY  02/14/2012   lumbar OR #7; "today redid L1L2; replaced screws; added bone from hip"   BILATERAL KNEE ARTHROSCOPY     COLONOSCOPY  10/15/2008   Dr. Jena Gauss: tubular adenoma    COLONOSCOPY  12/17/2003   OZH:YQMVHQ rectal and colon   COLONOSCOPY N/A 09/05/2014   tubular adenoma   COLONOSCOPY WITH PROPOFOL N/A 09/15/2022   Multiple 3-25 mm polyps in ascending colon and cecum, markedly elongated and redundant colon. One polyp removed piecemeal fashion. 6 month surveillance recommended. Tubular adenomas.  By: Jasmine Pang M.D.   On: 10/04/2023 21:56   CT HEAD CODE STROKE WO CONTRAST  Result Date: 10/04/2023 CLINICAL DATA:  Code stroke. Slurred speech, leading to right side, generalized weakness EXAM: CT HEAD  WITHOUT CONTRAST TECHNIQUE: Contiguous axial images were obtained from the base of the skull through the vertex without intravenous contrast. RADIATION DOSE REDUCTION: This exam was performed according to the departmental dose-optimization program which includes automated exposure control, adjustment of the mA and/or kV according to patient size and/or use of iterative reconstruction technique. COMPARISON:  06/16/2023 FINDINGS: Brain: No evidence of acute infarction, hemorrhage, mass, mass effect, or midline shift. No hydrocephalus or extra-axial collection. Mildly advanced cerebral atrophy for age, with remote lacunar infarcts in the basal ganglia and pons. Periventricular white matter changes, likely the sequela of chronic small vessel ischemic disease. Vascular: No hyperdense vessel. Skull: Negative for fracture or focal lesion. Sinuses/Orbits: No acute finding. Other: The mastoid air cells are well aerated. ASPECTS [facility] Stroke Program Early CT Score) - Ganglionic level infarction (caudate, lentiform nuclei, internal capsule, insula, M1-M3 cortex): 7 - Supraganglionic infarction (M4-M6 cortex): 3 Total score (0-10 with 10 being normal): 10 IMPRESSION: 1. No acute intracranial process. 2. ASPECTS is 10. Code stroke imaging results were communicated on 10/04/2023 at 7:05 pm to provider [place] via telephone, who verbally acknowledged these results. Electronically Signed   By: Wiliam Ke M.D.   On: 10/04/2023 19:05   DG Wrist Complete Left  Result Date: 10/03/2023 CLINICAL DATA:  Left wrist pain and swelling after fall this afternoon EXAM: LEFT WRIST - COMPLETE 3+ VIEW COMPARISON:  02/16/2019 FINDINGS: Mildly displaced distal radial fracture primarily involving the metaphysis. Possible radiocarpal involvement. No additional acute fracture. There is chondrocalcinosis in the region of the triangular fibrocartilage, chronic. Corticated density adjacent to the ulna styloid, chronic. Lateral view is limited  due to positioning. Generalized soft tissue edema. IMPRESSION: Mildly displaced distal radial fracture primarily involving the metaphysis. Electronically Signed   By: Narda Rutherford M.D.   On: 10/03/2023 17:22   ECHOCARDIOGRAM COMPLETE  Result Date: 10/03/2023    ECHOCARDIOGRAM REPORT   Patient Name:   Cody Velez Date of Exam: 10/03/2023 Medical Rec #:  [id number]         Height:       75.0 in Accession #:    [id number]        Weight:       270.0 lb Date of Birth:  04/09/1946          BSA:          2.493 m Patient Age:    77 years          BP:           120/77 mmHg Patient Gender: M                 HR:           77 bpm. [facility] Procedure: 2D Echo, Cardiac Doppler and Color Doppler Indications:    I42.9 Cardiomyopathy (unspecified)  History:        Patient has prior history of Echocardiogram examinations, most                 recent 03/21/2018. CHF, CAD, Abnormal ECG, Stroke,                 Arrythmias:Bradycardia, Signs/Symptoms:Bacteremia; Risk

## 2023-10-06 NOTE — TOC Progression Note (Signed)
Transition of Care [facility] - Progression Note    Patient Details  Name: Cody Velez MRN: [id number] Date of Birth: 02-Nov-1946  Transition of Care [facility]) CM/SW Contact  Karn Cassis, [place] Phone Number: 10/06/2023, 11:11 AM  Clinical Narrative: LCSW spoke with Victorino Dike at [facility] who reports they can accept pt tomorrow. Victorino Dike has discussed with daughter who is agreeable. LCSW confirmed plan. Clydie Braun with [facility] updated. TOC will need to call Crystal at [phone] tomorrow.      Expected Discharge Plan: Skilled Nursing Facility Barriers to Discharge: Continued Medical Work up  Expected Discharge Plan and Services In-house Referral: Clinical Social Work   Post Acute Care Choice: Skilled Nursing Facility Living arrangements for the past 2 months: Single Family Home                                       Social Determinants of Health (SDOH) Interventions SDOH Screenings   Food Insecurity: No Food Insecurity (10/05/2023)  Housing: Low Risk  (10/05/2023)  Transportation Needs: No Transportation Needs (10/05/2023)  Utilities: Not At Risk (10/05/2023)  Depression (PHQ2-9): Low Risk  (10/30/2020)  Financial Resource Strain: Low Risk  (07/14/2023)   [facility] from [facility]  Social Connections: Unknown (05/03/2022)   Received from [facility]  Tobacco Use: High Risk (10/05/2023)    Readmission Risk Interventions     No data to display

## 2023-10-06 NOTE — Discharge Instructions (Addendum)
1)Avoid ibuprofen/Advil/Aleve/Motrin/Goody Powders/Naproxen/BC powders/Meloxicam/Diclofenac/Indomethacin and other Nonsteroidal anti-inflammatory medications as these will make you more likely to bleed and can cause stomach ulcers, can also cause Kidney problems.   2)Repeat CBC and BMP Blood tests on Monday 10/10/23  3)Follow-up with orthopedic surgeon for left upper extremity fracture -Follow up with Orthopedic Surgeon---Dr. Fuller Canada, MD---in 2 to 3 weeks  -Address: [address] [place], [place] [address] Phone: [phone]

## 2023-10-07 ENCOUNTER — Encounter (HOSPITAL_COMMUNITY): Payer: Self-pay | Admitting: Family Medicine

## 2023-10-07 DIAGNOSIS — I5022 Chronic systolic (congestive) heart failure: Secondary | ICD-10-CM | POA: Diagnosis not present

## 2023-10-07 DIAGNOSIS — E785 Hyperlipidemia, unspecified: Secondary | ICD-10-CM | POA: Diagnosis not present

## 2023-10-07 DIAGNOSIS — Z86718 Personal history of other venous thrombosis and embolism: Secondary | ICD-10-CM | POA: Diagnosis not present

## 2023-10-07 DIAGNOSIS — S81811D Laceration without foreign body, right lower leg, subsequent encounter: Secondary | ICD-10-CM | POA: Diagnosis not present

## 2023-10-07 DIAGNOSIS — I739 Peripheral vascular disease, unspecified: Secondary | ICD-10-CM | POA: Diagnosis not present

## 2023-10-07 DIAGNOSIS — I110 Hypertensive heart disease with heart failure: Secondary | ICD-10-CM | POA: Diagnosis not present

## 2023-10-07 DIAGNOSIS — I69398 Other sequelae of cerebral infarction: Secondary | ICD-10-CM | POA: Diagnosis not present

## 2023-10-07 DIAGNOSIS — F0390 Unspecified dementia without behavioral disturbance: Secondary | ICD-10-CM | POA: Diagnosis not present

## 2023-10-07 DIAGNOSIS — S62102D Fracture of unspecified carpal bone, left wrist, subsequent encounter for fracture with routine healing: Secondary | ICD-10-CM | POA: Diagnosis not present

## 2023-10-07 DIAGNOSIS — I10 Essential (primary) hypertension: Secondary | ICD-10-CM | POA: Diagnosis not present

## 2023-10-07 DIAGNOSIS — G4700 Insomnia, unspecified: Secondary | ICD-10-CM | POA: Diagnosis not present

## 2023-10-07 DIAGNOSIS — E66812 Obesity, class 2: Secondary | ICD-10-CM | POA: Diagnosis not present

## 2023-10-07 DIAGNOSIS — I5020 Unspecified systolic (congestive) heart failure: Secondary | ICD-10-CM | POA: Diagnosis not present

## 2023-10-07 DIAGNOSIS — G8929 Other chronic pain: Secondary | ICD-10-CM | POA: Diagnosis not present

## 2023-10-07 DIAGNOSIS — R531 Weakness: Secondary | ICD-10-CM | POA: Diagnosis not present

## 2023-10-07 DIAGNOSIS — R29818 Other symptoms and signs involving the nervous system: Secondary | ICD-10-CM | POA: Diagnosis not present

## 2023-10-07 DIAGNOSIS — F32A Depression, unspecified: Secondary | ICD-10-CM | POA: Diagnosis not present

## 2023-10-07 DIAGNOSIS — J45909 Unspecified asthma, uncomplicated: Secondary | ICD-10-CM | POA: Diagnosis not present

## 2023-10-07 DIAGNOSIS — T25011D Burn of unspecified degree of right ankle, subsequent encounter: Secondary | ICD-10-CM | POA: Diagnosis not present

## 2023-10-07 DIAGNOSIS — M25061 Hemarthrosis, right knee: Secondary | ICD-10-CM | POA: Diagnosis not present

## 2023-10-07 DIAGNOSIS — T24011D Burn of unspecified degree of right thigh, subsequent encounter: Secondary | ICD-10-CM | POA: Diagnosis not present

## 2023-10-07 DIAGNOSIS — R2689 Other abnormalities of gait and mobility: Secondary | ICD-10-CM | POA: Diagnosis not present

## 2023-10-07 DIAGNOSIS — M1990 Unspecified osteoarthritis, unspecified site: Secondary | ICD-10-CM | POA: Diagnosis not present

## 2023-10-07 DIAGNOSIS — T25321D Burn of third degree of right foot, subsequent encounter: Secondary | ICD-10-CM | POA: Diagnosis not present

## 2023-10-07 DIAGNOSIS — T24311D Burn of third degree of right thigh, subsequent encounter: Secondary | ICD-10-CM | POA: Diagnosis not present

## 2023-10-07 DIAGNOSIS — I69351 Hemiplegia and hemiparesis following cerebral infarction affecting right dominant side: Secondary | ICD-10-CM | POA: Diagnosis not present

## 2023-10-07 DIAGNOSIS — G4733 Obstructive sleep apnea (adult) (pediatric): Secondary | ICD-10-CM | POA: Diagnosis not present

## 2023-10-07 DIAGNOSIS — Z7902 Long term (current) use of antithrombotics/antiplatelets: Secondary | ICD-10-CM | POA: Diagnosis not present

## 2023-10-07 DIAGNOSIS — Z9181 History of falling: Secondary | ICD-10-CM | POA: Diagnosis not present

## 2023-10-07 DIAGNOSIS — T25331D Burn of third degree of right toe(s) (nail), subsequent encounter: Secondary | ICD-10-CM | POA: Diagnosis not present

## 2023-10-07 DIAGNOSIS — F419 Anxiety disorder, unspecified: Secondary | ICD-10-CM | POA: Diagnosis not present

## 2023-10-07 DIAGNOSIS — E440 Moderate protein-calorie malnutrition: Secondary | ICD-10-CM | POA: Diagnosis not present

## 2023-10-07 DIAGNOSIS — G629 Polyneuropathy, unspecified: Secondary | ICD-10-CM | POA: Diagnosis not present

## 2023-10-07 DIAGNOSIS — S12100D Unspecified displaced fracture of second cervical vertebra, subsequent encounter for fracture with routine healing: Secondary | ICD-10-CM | POA: Diagnosis not present

## 2023-10-07 DIAGNOSIS — G9340 Encephalopathy, unspecified: Secondary | ICD-10-CM | POA: Diagnosis not present

## 2023-10-07 DIAGNOSIS — G8220 Paraplegia, unspecified: Secondary | ICD-10-CM | POA: Diagnosis not present

## 2023-10-07 DIAGNOSIS — I2510 Atherosclerotic heart disease of native coronary artery without angina pectoris: Secondary | ICD-10-CM | POA: Diagnosis not present

## 2023-10-07 DIAGNOSIS — M549 Dorsalgia, unspecified: Secondary | ICD-10-CM | POA: Diagnosis not present

## 2023-10-07 DIAGNOSIS — R2681 Unsteadiness on feet: Secondary | ICD-10-CM | POA: Diagnosis not present

## 2023-10-07 DIAGNOSIS — T25021D Burn of unspecified degree of right foot, subsequent encounter: Secondary | ICD-10-CM | POA: Diagnosis not present

## 2023-10-07 DIAGNOSIS — F4310 Post-traumatic stress disorder, unspecified: Secondary | ICD-10-CM | POA: Diagnosis not present

## 2023-10-07 DIAGNOSIS — K219 Gastro-esophageal reflux disease without esophagitis: Secondary | ICD-10-CM | POA: Diagnosis not present

## 2023-10-07 DIAGNOSIS — Z79899 Other long term (current) drug therapy: Secondary | ICD-10-CM | POA: Diagnosis not present

## 2023-10-07 NOTE — Progress Notes (Signed)
Spoke with Jonni Sanger at [facility] center and provided report.

## 2023-10-07 NOTE — Plan of Care (Signed)
CHL Tonsillectomy/Adenoidectomy, Postoperative PEDS care plan entered in error.

## 2023-10-07 NOTE — Progress Notes (Signed)
PROGRESS NOTE  Cody Velez, is a 78 y.o. male, DOB - May 09, 1946, ION:[id number]  Admit date - 10/04/2023   Admitting Physician Cody Gilford, DO  Outpatient Primary MD for the patient is Cody Perches, MD  LOS - 2  Chief Complaint  Patient presents with   Code Stroke      Brief Narrative:  [name] with CAD, chronic systolic CHF, mild dementia, history of CVA, prostate cancer, PTSD, hypertension, chronic bilateral lower leg weakness, wheelchair-bound presented to the ED with slurred speech and facial droop.  At baseline uses an electric wheelchair, lives with his girlfriend, had 2 falls recently, diagnosed with wrist fracture 10/14 [facility], placed in L wrist forearm cast and oxycodone. -Brought to the ED as a code stroke, vital signs stable, labs mild hyperkalemia, resolved on repeat, mild leukocytosis, normal lactate, unremarkable UA, UDS positive for opiates, chest x-ray without acute findings  Addendum -Patient was discharged to [facility] inpatient hospital rehab on 10/06/2023 -He was unable to get the due to logistical issues with his insurance -Patient is seen and reevaluated and he remains medically stable for discharge to rehab today 10/07/2023  Assessment and Plan: 1)Acute metabolic encephalopathy - Reported slurred speech and drifting to the right - Neurology consulted and thinks more likely this is metabolic than a stroke - CT head was no acute intracranial abnormality - MRI Brain and MRI C-spine without new acute findings -Ammonia is not elevated -UDS shows opiates -According to his live in girlfriend.. Cody Velez) [facility]--patient's mentation and neurostatus appears to be back to baseline -Suspect polypharmacy related in the setting of opiate and benzo use -Patient's medications have been adjusted -Patient remains coherent and cooperative   2)Chronic systolic CHF  - Continue Zebeta, Entresto, Aldactone - echo from 10/03/23 showed ejection  fraction of 30 to 35% - This is an improvement from the echo previously that showed an ejection fraction of 20-25%  - No clinical signs of acute exacerbation   3)Lt Wrist Fracture--- x-rays from 10/03/2023 showed mildly displaced distal radial fracture primarily involving the metaphysis. -Patient is in a short arm cast -Patient to follow-up with orthopedic surgeon in 2 to 3 weeks advised   4)Leukocytosis - Leukocytosis of 13.7 >> 9.4---suspect reactive - Procalcitonin is undetectable -No evidence of frank infection per se -Leukocytosis normalized   5)PTSD (post-traumatic stress disorder) - Continue Abilify, Effexor   6)GERD - Continue Protonix   7)HTN - Continue Zebeta, Entresto, Aldactone   8)SMOKELESS TOBACCO ABUSE - Patient chews tobacco - Declines nicotine patch at this time -Abstinence from tobacco advised   9)HLD- - Continue Crestor   10)Generalized weakness and ambulatory dysfunction--- at baseline usually able to transfer from chair to bed, uses wheelchair  PT and OT eval appreciated -Recommends rehab\ Discharged to rehab facility-  Disposition: The patient is from: Home              Anticipated d/c is to:  [facility] inpatient rehab              - Addendum -Patient was discharged to [facility] inpatient hospital rehab on 10/06/2023 -He was unable to get the due to logistical issues with his insurance -Patient is seen and reevaluated and he remains medically stable for discharge to rehab today 10/07/2023  Code Status :  -  Code Status: Full Code   Family Communication:   (patient is alert, awake and coherent)  Discussed with patient living girlfriend who is at bedside  DVT Prophylaxis  :   -  SCDs   heparin injection 5,000 Units Start: 10/05/23 0600 SCDs Start: 10/05/23 0025   Lab Results  Component Value Date   PLT 497 (H) 10/06/2023    Inpatient Medications  Scheduled Meds:  alfuzosin  10 mg Oral Q breakfast   ARIPiprazole  5 mg Oral Daily   bisoprolol   5 mg Oral Daily   clopidogrel  75 mg Oral Daily   finasteride  5 mg Oral Daily   gabapentin  200 mg Oral QHS   heparin  5,000 Units Subcutaneous Q8H   pantoprazole  40 mg Oral Daily   rosuvastatin  20 mg Oral Daily   sacubitril-valsartan  1 tablet Oral BID   spironolactone  12.5 mg Oral Daily   traZODone  100 mg Oral QHS   venlafaxine XR  150 mg Oral BID   Continuous Infusions: PRN Meds:.acetaminophen **OR** acetaminophen, morphine injection, ondansetron **OR** ondansetron (ZOFRAN) IV, oxyCODONE   Anti-infectives (From admission, onward)    None         Subjective: Cody Velez today has no fevers, no emesis,  No chest pain,     Addendum -Patient was discharged to [facility] inpatient hospital rehab on 10/06/2023 -He was unable to get the due to logistical issues with his insurance -Patient is seen and reevaluated and he remains medically stable for discharge to rehab today 10/07/2023   Objective: Vitals:   10/06/23 0800 10/06/23 1314 10/06/23 2020 10/07/23 0522  BP:  126/65 133/69 (!) 152/74  Pulse:   66   Resp: 17 17 20 20   Temp:  97.8 F (36.6 C) 98.7 F (37.1 C) 97.9 F (36.6 C)  TempSrc:      SpO2:  97% 98% 94%  Weight:      Height:        Intake/Output Summary (Last 24 hours) at 10/07/2023 1212 Last data filed at 10/07/2023 0500 Gross per 24 hour  Intake 750 ml  Output 2953 ml  Net -2203 ml   Filed Weights   10/04/23 1903 10/05/23 0735  Weight: 128.8 kg 128.8 kg   Physical Exam Gen:- Awake Alert,  in no apparent distress  HEENT:- [place].AT, No sclera icterus Neck-Supple Neck,No JVD,.  Lungs-  CTAB , fair symmetrical air movement CV- S1, S2 normal, regular  Abd-  +ve B.Sounds, Abd Soft, No tenderness,    Extremity/Skin:- No  edema, pedal pulses present , Lt UE short arm cast,  some swelling around the fingers, capillary refill is less than 2 seconds  Psych-affect is appropriate, oriented x3, coherent and cooperative Neuro-generalized weakness, no  new focal deficits, no tremors  Data Reviewed: I have personally reviewed following labs and imaging studies  CBC: Recent Labs  Lab 10/04/23 1943 10/04/23 2050 10/05/23 0319 10/06/23 0455  WBC  --  13.2* 13.7* 9.4  NEUTROABS  --  10.8* 10.3*  --   HGB 19.0* 15.3 14.9 13.1  HCT 56.0* 52.5* 53.7* 46.5  MCV  --  87.1 91.0 86.8  PLT  --  576* 473* 497*   Basic Metabolic Panel: Recent Labs  Lab 10/04/23 1943 10/04/23 2049 10/05/23 0319 10/06/23 0455  NA 138 135 137 135  K 5.2* 4.2 5.0 3.4*  CL 103 103 103 103  CO2  --  23 23 22   GLUCOSE 105* 96 98 89  BUN 14 12 11 10   CREATININE 1.30* 1.18 1.04 0.89  CALCIUM  --  8.7* 9.1 8.5*  MG  --   --  1.9  --  GFR: Estimated Creatinine Clearance: 99.1 mL/min (by C-G formula based on SCr of 0.89 mg/dL). Liver Function Tests: Recent Labs  Lab 10/04/23 2049 10/05/23 0319  AST 15 30  ALT 13 14  ALKPHOS 69 68  BILITOT 0.8 1.0  PROT 7.0 7.2  ALBUMIN 3.1* 3.2*   Recent Results (from the past 240 hour(s))  SARS Coronavirus 2 by RT PCR (hospital order, performed in [facility] hospital lab) *cepheid single result test* Anterior Nasal Swab     Status: None   Collection Time: 10/04/23  7:40 PM   Specimen: Anterior Nasal Swab  Result Value Ref Range Status   SARS Coronavirus 2 by RT PCR NEGATIVE NEGATIVE Final    Comment: (NOTE) SARS-CoV-2 target nucleic acids are NOT DETECTED.  The SARS-CoV-2 RNA is generally detectable in upper and lower respiratory specimens during the acute phase of infection. The lowest concentration of SARS-CoV-2 viral copies this assay can detect is 250 copies / mL. A negative result does not preclude SARS-CoV-2 infection and should not be used as the sole basis for treatment or other patient management decisions.  A negative result may occur with improper specimen collection / handling, submission of specimen other than nasopharyngeal swab, presence of viral mutation(s) within the areas targeted by  this assay, and inadequate number of viral copies (<250 copies / mL). A negative result must be combined with clinical observations, patient history, and epidemiological information.  Fact Sheet for Patients:   RoadLapTop.co.za  Fact Sheet for Healthcare Providers: http://kim-miller.com/  This test is not yet approved or  cleared by the [place] FDA and has been authorized for detection and/or diagnosis of SARS-CoV-2 by FDA under an Emergency Use Authorization (EUA).  This EUA will remain in effect (meaning this test can be used) for the duration of the COVID-19 declaration under Section 564(b)(1) of the Act, 21 U.S.C. section 360bbb-3(b)(1), unless the authorization is terminated or revoked sooner.  Performed at [facility], [address], [place], [place] [address]     Scheduled Meds:  alfuzosin  10 mg Oral Q breakfast   ARIPiprazole  5 mg Oral Daily   bisoprolol  5 mg Oral Daily   clopidogrel  75 mg Oral Daily   finasteride  5 mg Oral Daily   gabapentin  200 mg Oral QHS   heparin  5,000 Units Subcutaneous Q8H   pantoprazole  40 mg Oral Daily   rosuvastatin  20 mg Oral Daily   sacubitril-valsartan  1 tablet Oral BID   spironolactone  12.5 mg Oral Daily   traZODone  100 mg Oral QHS   venlafaxine XR  150 mg Oral BID   Continuous Infusions:   LOS: 2 days   Shon Hale M.D on 10/07/2023 at 12:12 PM  Go to [email or web address] - for contact info  [facility] - Office  [phone]  If 7PM-7AM, please contact night-coverage [email or web address] 10/07/2023, 12:12 PM

## 2023-10-07 NOTE — Plan of Care (Signed)
  Problem: Clinical Measurements: Goal: Will remain free from infection Outcome: Not Progressing   Problem: Nutrition: Goal: Adequate nutrition will be maintained Outcome: Not Progressing   Problem: Safety: Goal: Ability to remain free from injury will improve Outcome: Not Progressing   Problem: Skin Integrity: Goal: Risk for impaired skin integrity will decrease Outcome: Not Progressing

## 2023-10-07 NOTE — Progress Notes (Signed)
   10/07/23 1749  Section 1: Provider Certification  Patient Condition Patient stabilized;No emergency medical condition present  Reason for Transfer Requires inpatient rehab  Benefits of Transfer Requires inpatient rehab  Risks of Transfer vehicular Accident/decompensation  Level of Care Other (Comment)  Accepting Physician Dr. Chales Salmon  Sending Physician Courage Emokpae  Physician Assessment Patient examined and risks and benefits explained  Section 2: Clinician Certification  Accepting hospital or facility Available service confirmed;Available space confirmed;Available personnel confirmed  Accepting Facility [facility]  Transfer Coordinator - name, # Dr. Chales Salmon  Transport By Other (Comment)  Copies of Medical Records Sent H & P;Progress Note;Transfer Form;Med Rec Form;X-ray;Nursing Note  E - Vitals (15 min before transfer)  Temp 97.8 F (36.6 C)  Pulse Rate 64  Resp 16  BP 114/68  SpO2 98 %  Section 4: Provider Reassessment (within 1 hour of departure)  Physician Reassessment Reassessment completed prior to transfer (Provider Only)

## 2023-10-07 NOTE — TOC Transition Note (Addendum)
Transition of Care [facility]) - CM/SW Discharge Note   Patient Details  Name: Cody Velez MRN: [id number] Date of Birth: [birth year]  [facility]) CM/SW Contact:  Villa Herb, LCSWA Phone Number: 10/07/2023, 3:07 PM   Clinical Narrative:    CSW updated floor staff this morning at 9AM that due to pt going to [facility] it will be an EMTALA transport. CSW provided RN and MD with accepting MD and report numbers. CSW updated [facility] and treatment team that [facility] team is unable to set up up transport as it is an Arts development officer transfer. TOC to follow.   CSW updated by facility that pts family can transport him if needed. CSW updated RN and [facility] of this. TOC to follow.   Final next level of care:  [facility]) Barriers to Discharge: Barriers Resolved   Patient Goals and CMS Choice   Choice offered to / list presented to : Patient (Significant other)  Discharge Placement                         Discharge Plan and Services Additional resources added to the After Visit Summary for   In-house Referral: Clinical Social Work   Post Acute Care Choice: Skilled Nursing Facility                               Social Determinants of Health (SDOH) Interventions SDOH Screenings   Food Insecurity: No Food Insecurity (10/05/2023)  Housing: Low Risk  (10/05/2023)  Transportation Needs: No Transportation Needs (10/05/2023)  Utilities: Not At Risk (10/05/2023)  Depression (PHQ2-9): Low Risk  (10/30/2020)  Financial Resource Strain: Low Risk  (07/14/2023)   [facility] from [facility]  Social Connections: Unknown (05/03/2022)   Received from [facility]  Tobacco Use: High Risk (10/05/2023)     Readmission Risk Interventions     No data to display

## 2023-10-08 DIAGNOSIS — M25061 Hemarthrosis, right knee: Secondary | ICD-10-CM | POA: Diagnosis not present

## 2023-10-08 DIAGNOSIS — R2681 Unsteadiness on feet: Secondary | ICD-10-CM | POA: Diagnosis not present

## 2023-10-08 DIAGNOSIS — I10 Essential (primary) hypertension: Secondary | ICD-10-CM | POA: Diagnosis not present

## 2023-10-08 DIAGNOSIS — I2510 Atherosclerotic heart disease of native coronary artery without angina pectoris: Secondary | ICD-10-CM | POA: Diagnosis not present

## 2023-10-18 ENCOUNTER — Other Ambulatory Visit: Payer: Self-pay | Admitting: Cardiology

## 2023-10-18 ENCOUNTER — Encounter: Payer: Self-pay | Admitting: Cardiology

## 2023-10-18 DIAGNOSIS — T25331D Burn of third degree of right toe(s) (nail), subsequent encounter: Secondary | ICD-10-CM | POA: Diagnosis not present

## 2023-10-18 DIAGNOSIS — I110 Hypertensive heart disease with heart failure: Secondary | ICD-10-CM | POA: Diagnosis not present

## 2023-10-18 DIAGNOSIS — E440 Moderate protein-calorie malnutrition: Secondary | ICD-10-CM | POA: Diagnosis not present

## 2023-10-18 DIAGNOSIS — I739 Peripheral vascular disease, unspecified: Secondary | ICD-10-CM | POA: Diagnosis not present

## 2023-10-18 DIAGNOSIS — Z86718 Personal history of other venous thrombosis and embolism: Secondary | ICD-10-CM | POA: Diagnosis not present

## 2023-10-18 DIAGNOSIS — Z79899 Other long term (current) drug therapy: Secondary | ICD-10-CM | POA: Diagnosis not present

## 2023-10-18 DIAGNOSIS — S12100D Unspecified displaced fracture of second cervical vertebra, subsequent encounter for fracture with routine healing: Secondary | ICD-10-CM | POA: Diagnosis not present

## 2023-10-18 DIAGNOSIS — T25321D Burn of third degree of right foot, subsequent encounter: Secondary | ICD-10-CM | POA: Diagnosis not present

## 2023-10-18 DIAGNOSIS — M1990 Unspecified osteoarthritis, unspecified site: Secondary | ICD-10-CM | POA: Diagnosis not present

## 2023-10-18 DIAGNOSIS — Z9181 History of falling: Secondary | ICD-10-CM | POA: Diagnosis not present

## 2023-10-18 DIAGNOSIS — J45909 Unspecified asthma, uncomplicated: Secondary | ICD-10-CM | POA: Diagnosis not present

## 2023-10-18 DIAGNOSIS — I2510 Atherosclerotic heart disease of native coronary artery without angina pectoris: Secondary | ICD-10-CM | POA: Diagnosis not present

## 2023-10-18 DIAGNOSIS — I5020 Unspecified systolic (congestive) heart failure: Secondary | ICD-10-CM | POA: Diagnosis not present

## 2023-10-18 DIAGNOSIS — T24311D Burn of third degree of right thigh, subsequent encounter: Secondary | ICD-10-CM | POA: Diagnosis not present

## 2023-10-18 DIAGNOSIS — Z7902 Long term (current) use of antithrombotics/antiplatelets: Secondary | ICD-10-CM | POA: Diagnosis not present

## 2023-10-18 DIAGNOSIS — E785 Hyperlipidemia, unspecified: Secondary | ICD-10-CM | POA: Diagnosis not present

## 2023-10-18 DIAGNOSIS — I69351 Hemiplegia and hemiparesis following cerebral infarction affecting right dominant side: Secondary | ICD-10-CM | POA: Diagnosis not present

## 2023-10-18 DIAGNOSIS — G4733 Obstructive sleep apnea (adult) (pediatric): Secondary | ICD-10-CM | POA: Diagnosis not present

## 2023-10-20 DIAGNOSIS — I5022 Chronic systolic (congestive) heart failure: Secondary | ICD-10-CM | POA: Diagnosis not present

## 2023-10-20 DIAGNOSIS — G9340 Encephalopathy, unspecified: Secondary | ICD-10-CM | POA: Diagnosis not present

## 2023-10-20 DIAGNOSIS — Z823 Family history of stroke: Secondary | ICD-10-CM | POA: Diagnosis not present

## 2023-10-21 DIAGNOSIS — T24311D Burn of third degree of right thigh, subsequent encounter: Secondary | ICD-10-CM | POA: Diagnosis not present

## 2023-10-21 DIAGNOSIS — T25331D Burn of third degree of right toe(s) (nail), subsequent encounter: Secondary | ICD-10-CM | POA: Diagnosis not present

## 2023-10-21 DIAGNOSIS — T25321D Burn of third degree of right foot, subsequent encounter: Secondary | ICD-10-CM | POA: Diagnosis not present

## 2023-10-21 DIAGNOSIS — S12100D Unspecified displaced fracture of second cervical vertebra, subsequent encounter for fracture with routine healing: Secondary | ICD-10-CM | POA: Diagnosis not present

## 2023-10-21 DIAGNOSIS — I5020 Unspecified systolic (congestive) heart failure: Secondary | ICD-10-CM | POA: Diagnosis not present

## 2023-10-21 DIAGNOSIS — I110 Hypertensive heart disease with heart failure: Secondary | ICD-10-CM | POA: Diagnosis not present

## 2023-10-25 DIAGNOSIS — S52502A Unspecified fracture of the lower end of left radius, initial encounter for closed fracture: Secondary | ICD-10-CM | POA: Diagnosis not present

## 2023-10-25 DIAGNOSIS — M25532 Pain in left wrist: Secondary | ICD-10-CM | POA: Diagnosis not present

## 2023-10-26 DIAGNOSIS — T24311D Burn of third degree of right thigh, subsequent encounter: Secondary | ICD-10-CM | POA: Diagnosis not present

## 2023-10-26 DIAGNOSIS — T25321D Burn of third degree of right foot, subsequent encounter: Secondary | ICD-10-CM | POA: Diagnosis not present

## 2023-10-27 DIAGNOSIS — T25321D Burn of third degree of right foot, subsequent encounter: Secondary | ICD-10-CM | POA: Diagnosis not present

## 2023-10-27 DIAGNOSIS — T25331D Burn of third degree of right toe(s) (nail), subsequent encounter: Secondary | ICD-10-CM | POA: Diagnosis not present

## 2023-10-27 DIAGNOSIS — S12100D Unspecified displaced fracture of second cervical vertebra, subsequent encounter for fracture with routine healing: Secondary | ICD-10-CM | POA: Diagnosis not present

## 2023-10-27 DIAGNOSIS — T24311D Burn of third degree of right thigh, subsequent encounter: Secondary | ICD-10-CM | POA: Diagnosis not present

## 2023-10-27 DIAGNOSIS — I110 Hypertensive heart disease with heart failure: Secondary | ICD-10-CM | POA: Diagnosis not present

## 2023-10-27 DIAGNOSIS — I5020 Unspecified systolic (congestive) heart failure: Secondary | ICD-10-CM | POA: Diagnosis not present

## 2023-10-28 DIAGNOSIS — T25331D Burn of third degree of right toe(s) (nail), subsequent encounter: Secondary | ICD-10-CM | POA: Diagnosis not present

## 2023-10-28 DIAGNOSIS — T24311D Burn of third degree of right thigh, subsequent encounter: Secondary | ICD-10-CM | POA: Diagnosis not present

## 2023-10-28 DIAGNOSIS — I110 Hypertensive heart disease with heart failure: Secondary | ICD-10-CM | POA: Diagnosis not present

## 2023-10-28 DIAGNOSIS — S12100D Unspecified displaced fracture of second cervical vertebra, subsequent encounter for fracture with routine healing: Secondary | ICD-10-CM | POA: Diagnosis not present

## 2023-10-28 DIAGNOSIS — T25321D Burn of third degree of right foot, subsequent encounter: Secondary | ICD-10-CM | POA: Diagnosis not present

## 2023-10-28 DIAGNOSIS — I5020 Unspecified systolic (congestive) heart failure: Secondary | ICD-10-CM | POA: Diagnosis not present

## 2023-11-01 ENCOUNTER — Encounter: Payer: Self-pay | Admitting: Cardiology

## 2023-11-01 DIAGNOSIS — I5020 Unspecified systolic (congestive) heart failure: Secondary | ICD-10-CM | POA: Diagnosis not present

## 2023-11-01 DIAGNOSIS — T24311D Burn of third degree of right thigh, subsequent encounter: Secondary | ICD-10-CM | POA: Diagnosis not present

## 2023-11-01 DIAGNOSIS — S12100D Unspecified displaced fracture of second cervical vertebra, subsequent encounter for fracture with routine healing: Secondary | ICD-10-CM | POA: Diagnosis not present

## 2023-11-01 DIAGNOSIS — T25321D Burn of third degree of right foot, subsequent encounter: Secondary | ICD-10-CM | POA: Diagnosis not present

## 2023-11-01 DIAGNOSIS — I110 Hypertensive heart disease with heart failure: Secondary | ICD-10-CM | POA: Diagnosis not present

## 2023-11-01 DIAGNOSIS — T25331D Burn of third degree of right toe(s) (nail), subsequent encounter: Secondary | ICD-10-CM | POA: Diagnosis not present

## 2023-11-02 DIAGNOSIS — S12100D Unspecified displaced fracture of second cervical vertebra, subsequent encounter for fracture with routine healing: Secondary | ICD-10-CM | POA: Diagnosis not present

## 2023-11-02 DIAGNOSIS — I5020 Unspecified systolic (congestive) heart failure: Secondary | ICD-10-CM | POA: Diagnosis not present

## 2023-11-02 DIAGNOSIS — T25331D Burn of third degree of right toe(s) (nail), subsequent encounter: Secondary | ICD-10-CM | POA: Diagnosis not present

## 2023-11-02 DIAGNOSIS — I110 Hypertensive heart disease with heart failure: Secondary | ICD-10-CM | POA: Diagnosis not present

## 2023-11-02 DIAGNOSIS — T24311D Burn of third degree of right thigh, subsequent encounter: Secondary | ICD-10-CM | POA: Diagnosis not present

## 2023-11-02 DIAGNOSIS — T25321D Burn of third degree of right foot, subsequent encounter: Secondary | ICD-10-CM | POA: Diagnosis not present

## 2023-11-03 ENCOUNTER — Encounter: Payer: Self-pay | Admitting: Student

## 2023-11-03 ENCOUNTER — Ambulatory Visit: Payer: Medicare Other | Attending: Student | Admitting: Student

## 2023-11-03 VITALS — BP 126/70 | HR 64 | Ht 76.0 in | Wt 270.0 lb

## 2023-11-03 DIAGNOSIS — E785 Hyperlipidemia, unspecified: Secondary | ICD-10-CM | POA: Diagnosis not present

## 2023-11-03 DIAGNOSIS — I5022 Chronic systolic (congestive) heart failure: Secondary | ICD-10-CM | POA: Diagnosis not present

## 2023-11-03 DIAGNOSIS — I10 Essential (primary) hypertension: Secondary | ICD-10-CM | POA: Diagnosis not present

## 2023-11-03 DIAGNOSIS — I491 Atrial premature depolarization: Secondary | ICD-10-CM | POA: Diagnosis not present

## 2023-11-03 DIAGNOSIS — I2510 Atherosclerotic heart disease of native coronary artery without angina pectoris: Secondary | ICD-10-CM | POA: Insufficient documentation

## 2023-11-03 MED ORDER — EMPAGLIFLOZIN 10 MG PO TABS: 10.0000 mg | ORAL_TABLET | Freq: Every day | ORAL | 11 refills | Status: DC

## 2023-11-03 MED ORDER — FUROSEMIDE 20 MG PO TABS: 20.0000 mg | ORAL_TABLET | Freq: Every day | ORAL | 11 refills | Status: DC | PRN

## 2023-11-03 MED ORDER — ENTRESTO 49-51 MG PO TABS: 1.0000 | ORAL_TABLET | Freq: Two times a day (BID) | ORAL | 3 refills | Status: DC

## 2023-11-03 NOTE — Patient Instructions (Signed)
Medication Instructions:   Restart Jardiance 10 mg Daily  Restart Lasix 20 mg Daily for 3 Days then take as needed for swelling.   *If you need a refill on your cardiac medications before your next appointment, please call your pharmacy*   Lab Work: NONE   If you have labs (blood work) drawn today and your tests are completely normal, you will receive your results only by: MyChart Message (if you have MyChart) OR A paper copy in the mail If you have any lab test that is abnormal or we need to change your treatment, we will call you to review the results.   Testing/Procedures: NONE    Follow-Up: At [facility], you and your health needs are our priority.  As part of our continuing mission to provide you with exceptional heart care, we have created designated Provider Care Teams.  These Care Teams include your primary Cardiologist (physician) and Advanced Practice Providers (APPs -  Physician Assistants and Nurse Practitioners) who all work together to provide you with the care you need, when you need it.  We recommend signing up for the patient portal called "MyChart".  Sign up information is provided on this After Visit Summary.  MyChart is used to connect with patients for Virtual Visits (Telemedicine).  Patients are able to view lab/test results, encounter notes, upcoming appointments, etc.  Non-urgent messages can be sent to your provider as well.   To learn more about what you can do with MyChart, go to [email or web address].    Your next appointment:    December   Provider:   You may see Nona Dell, MD or one of the following Advanced Practice Providers on your designated Care Team:   Pham An, PA-C  Jacolyn Reedy, PA-C     Other Instructions Thank you for choosing [facility]!
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
## 2023-11-04 DIAGNOSIS — I110 Hypertensive heart disease with heart failure: Secondary | ICD-10-CM | POA: Diagnosis not present

## 2023-11-04 DIAGNOSIS — T25321D Burn of third degree of right foot, subsequent encounter: Secondary | ICD-10-CM | POA: Diagnosis not present

## 2023-11-04 DIAGNOSIS — S12100D Unspecified displaced fracture of second cervical vertebra, subsequent encounter for fracture with routine healing: Secondary | ICD-10-CM | POA: Diagnosis not present

## 2023-11-04 DIAGNOSIS — I5020 Unspecified systolic (congestive) heart failure: Secondary | ICD-10-CM | POA: Diagnosis not present

## 2023-11-04 DIAGNOSIS — T25331D Burn of third degree of right toe(s) (nail), subsequent encounter: Secondary | ICD-10-CM | POA: Diagnosis not present

## 2023-11-04 DIAGNOSIS — T24311D Burn of third degree of right thigh, subsequent encounter: Secondary | ICD-10-CM | POA: Diagnosis not present

## 2023-11-08 DIAGNOSIS — T25331D Burn of third degree of right toe(s) (nail), subsequent encounter: Secondary | ICD-10-CM | POA: Diagnosis not present

## 2023-11-08 DIAGNOSIS — T24311D Burn of third degree of right thigh, subsequent encounter: Secondary | ICD-10-CM | POA: Diagnosis not present

## 2023-11-08 DIAGNOSIS — I110 Hypertensive heart disease with heart failure: Secondary | ICD-10-CM | POA: Diagnosis not present

## 2023-11-08 DIAGNOSIS — S12100D Unspecified displaced fracture of second cervical vertebra, subsequent encounter for fracture with routine healing: Secondary | ICD-10-CM | POA: Diagnosis not present

## 2023-11-08 DIAGNOSIS — I5020 Unspecified systolic (congestive) heart failure: Secondary | ICD-10-CM | POA: Diagnosis not present

## 2023-11-08 DIAGNOSIS — T25321D Burn of third degree of right foot, subsequent encounter: Secondary | ICD-10-CM | POA: Diagnosis not present

## 2023-11-08 DIAGNOSIS — S52502D Unspecified fracture of the lower end of left radius, subsequent encounter for closed fracture with routine healing: Secondary | ICD-10-CM | POA: Diagnosis not present

## 2023-11-09 DIAGNOSIS — I5020 Unspecified systolic (congestive) heart failure: Secondary | ICD-10-CM | POA: Diagnosis not present

## 2023-11-09 DIAGNOSIS — T25321D Burn of third degree of right foot, subsequent encounter: Secondary | ICD-10-CM | POA: Diagnosis not present

## 2023-11-09 DIAGNOSIS — S12100D Unspecified displaced fracture of second cervical vertebra, subsequent encounter for fracture with routine healing: Secondary | ICD-10-CM | POA: Diagnosis not present

## 2023-11-09 DIAGNOSIS — I110 Hypertensive heart disease with heart failure: Secondary | ICD-10-CM | POA: Diagnosis not present

## 2023-11-09 DIAGNOSIS — T24311D Burn of third degree of right thigh, subsequent encounter: Secondary | ICD-10-CM | POA: Diagnosis not present

## 2023-11-09 DIAGNOSIS — T25331D Burn of third degree of right toe(s) (nail), subsequent encounter: Secondary | ICD-10-CM | POA: Diagnosis not present

## 2023-11-11 DIAGNOSIS — T24311D Burn of third degree of right thigh, subsequent encounter: Secondary | ICD-10-CM | POA: Diagnosis not present

## 2023-11-11 DIAGNOSIS — I110 Hypertensive heart disease with heart failure: Secondary | ICD-10-CM | POA: Diagnosis not present

## 2023-11-11 DIAGNOSIS — T25321D Burn of third degree of right foot, subsequent encounter: Secondary | ICD-10-CM | POA: Diagnosis not present

## 2023-11-11 DIAGNOSIS — S12100D Unspecified displaced fracture of second cervical vertebra, subsequent encounter for fracture with routine healing: Secondary | ICD-10-CM | POA: Diagnosis not present

## 2023-11-11 DIAGNOSIS — I5020 Unspecified systolic (congestive) heart failure: Secondary | ICD-10-CM | POA: Diagnosis not present

## 2023-11-11 DIAGNOSIS — T25331D Burn of third degree of right toe(s) (nail), subsequent encounter: Secondary | ICD-10-CM | POA: Diagnosis not present

## 2023-11-14 DIAGNOSIS — T25321D Burn of third degree of right foot, subsequent encounter: Secondary | ICD-10-CM | POA: Diagnosis not present

## 2023-11-14 DIAGNOSIS — I5020 Unspecified systolic (congestive) heart failure: Secondary | ICD-10-CM | POA: Diagnosis not present

## 2023-11-14 DIAGNOSIS — S12100D Unspecified displaced fracture of second cervical vertebra, subsequent encounter for fracture with routine healing: Secondary | ICD-10-CM | POA: Diagnosis not present

## 2023-11-14 DIAGNOSIS — T25331D Burn of third degree of right toe(s) (nail), subsequent encounter: Secondary | ICD-10-CM | POA: Diagnosis not present

## 2023-11-14 DIAGNOSIS — I110 Hypertensive heart disease with heart failure: Secondary | ICD-10-CM | POA: Diagnosis not present

## 2023-11-14 DIAGNOSIS — R69 Illness, unspecified: Secondary | ICD-10-CM | POA: Diagnosis not present

## 2023-11-14 DIAGNOSIS — T24311D Burn of third degree of right thigh, subsequent encounter: Secondary | ICD-10-CM | POA: Diagnosis not present

## 2023-11-15 DIAGNOSIS — T25331D Burn of third degree of right toe(s) (nail), subsequent encounter: Secondary | ICD-10-CM | POA: Diagnosis not present

## 2023-11-15 DIAGNOSIS — T25321D Burn of third degree of right foot, subsequent encounter: Secondary | ICD-10-CM | POA: Diagnosis not present

## 2023-11-15 DIAGNOSIS — I110 Hypertensive heart disease with heart failure: Secondary | ICD-10-CM | POA: Diagnosis not present

## 2023-11-15 DIAGNOSIS — T24311D Burn of third degree of right thigh, subsequent encounter: Secondary | ICD-10-CM | POA: Diagnosis not present

## 2023-11-15 DIAGNOSIS — S12100D Unspecified displaced fracture of second cervical vertebra, subsequent encounter for fracture with routine healing: Secondary | ICD-10-CM | POA: Diagnosis not present

## 2023-11-15 DIAGNOSIS — I5020 Unspecified systolic (congestive) heart failure: Secondary | ICD-10-CM | POA: Diagnosis not present

## 2023-11-16 DIAGNOSIS — T24311A Burn of third degree of right thigh, initial encounter: Secondary | ICD-10-CM | POA: Diagnosis not present

## 2023-11-16 DIAGNOSIS — S12100D Unspecified displaced fracture of second cervical vertebra, subsequent encounter for fracture with routine healing: Secondary | ICD-10-CM | POA: Diagnosis not present

## 2023-11-16 DIAGNOSIS — T25331D Burn of third degree of right toe(s) (nail), subsequent encounter: Secondary | ICD-10-CM | POA: Diagnosis not present

## 2023-11-16 DIAGNOSIS — T310 Burns involving less than 10% of body surface: Secondary | ICD-10-CM | POA: Diagnosis not present

## 2023-11-16 DIAGNOSIS — I110 Hypertensive heart disease with heart failure: Secondary | ICD-10-CM | POA: Diagnosis not present

## 2023-11-16 DIAGNOSIS — I5020 Unspecified systolic (congestive) heart failure: Secondary | ICD-10-CM | POA: Diagnosis not present

## 2023-11-16 DIAGNOSIS — T25321D Burn of third degree of right foot, subsequent encounter: Secondary | ICD-10-CM | POA: Diagnosis not present

## 2023-11-16 DIAGNOSIS — T24311D Burn of third degree of right thigh, subsequent encounter: Secondary | ICD-10-CM | POA: Diagnosis not present

## 2023-11-16 DIAGNOSIS — T25311A Burn of third degree of right ankle, initial encounter: Secondary | ICD-10-CM | POA: Diagnosis not present

## 2023-11-17 DIAGNOSIS — I5082 Biventricular heart failure: Secondary | ICD-10-CM | POA: Diagnosis not present

## 2023-11-17 DIAGNOSIS — T25331D Burn of third degree of right toe(s) (nail), subsequent encounter: Secondary | ICD-10-CM | POA: Diagnosis not present

## 2023-11-17 DIAGNOSIS — T23321D Burn of third degree of single right finger (nail) except thumb, subsequent encounter: Secondary | ICD-10-CM | POA: Diagnosis not present

## 2023-11-17 DIAGNOSIS — J45909 Unspecified asthma, uncomplicated: Secondary | ICD-10-CM | POA: Diagnosis not present

## 2023-11-17 DIAGNOSIS — I69351 Hemiplegia and hemiparesis following cerebral infarction affecting right dominant side: Secondary | ICD-10-CM | POA: Diagnosis not present

## 2023-11-17 DIAGNOSIS — M1990 Unspecified osteoarthritis, unspecified site: Secondary | ICD-10-CM | POA: Diagnosis not present

## 2023-11-17 DIAGNOSIS — G4700 Insomnia, unspecified: Secondary | ICD-10-CM | POA: Diagnosis not present

## 2023-11-17 DIAGNOSIS — S12100D Unspecified displaced fracture of second cervical vertebra, subsequent encounter for fracture with routine healing: Secondary | ICD-10-CM | POA: Diagnosis not present

## 2023-11-17 DIAGNOSIS — F02A3 Dementia in other diseases classified elsewhere, mild, with mood disturbance: Secondary | ICD-10-CM | POA: Diagnosis not present

## 2023-11-17 DIAGNOSIS — T24311D Burn of third degree of right thigh, subsequent encounter: Secondary | ICD-10-CM | POA: Diagnosis not present

## 2023-11-17 DIAGNOSIS — K219 Gastro-esophageal reflux disease without esophagitis: Secondary | ICD-10-CM | POA: Diagnosis not present

## 2023-11-17 DIAGNOSIS — F02A4 Dementia in other diseases classified elsewhere, mild, with anxiety: Secondary | ICD-10-CM | POA: Diagnosis not present

## 2023-11-17 DIAGNOSIS — F32A Depression, unspecified: Secondary | ICD-10-CM | POA: Diagnosis not present

## 2023-11-17 DIAGNOSIS — E440 Moderate protein-calorie malnutrition: Secondary | ICD-10-CM | POA: Diagnosis not present

## 2023-11-17 DIAGNOSIS — F4310 Post-traumatic stress disorder, unspecified: Secondary | ICD-10-CM | POA: Diagnosis not present

## 2023-11-17 DIAGNOSIS — E538 Deficiency of other specified B group vitamins: Secondary | ICD-10-CM | POA: Diagnosis not present

## 2023-11-17 DIAGNOSIS — T25321D Burn of third degree of right foot, subsequent encounter: Secondary | ICD-10-CM | POA: Diagnosis not present

## 2023-11-17 DIAGNOSIS — I5022 Chronic systolic (congestive) heart failure: Secondary | ICD-10-CM | POA: Diagnosis not present

## 2023-11-17 DIAGNOSIS — S62102D Fracture of unspecified carpal bone, left wrist, subsequent encounter for fracture with routine healing: Secondary | ICD-10-CM | POA: Diagnosis not present

## 2023-11-17 DIAGNOSIS — C61 Malignant neoplasm of prostate: Secondary | ICD-10-CM | POA: Diagnosis not present

## 2023-11-17 DIAGNOSIS — G4733 Obstructive sleep apnea (adult) (pediatric): Secondary | ICD-10-CM | POA: Diagnosis not present

## 2023-11-17 DIAGNOSIS — E785 Hyperlipidemia, unspecified: Secondary | ICD-10-CM | POA: Diagnosis not present

## 2023-11-17 DIAGNOSIS — G8220 Paraplegia, unspecified: Secondary | ICD-10-CM | POA: Diagnosis not present

## 2023-11-17 DIAGNOSIS — I2510 Atherosclerotic heart disease of native coronary artery without angina pectoris: Secondary | ICD-10-CM | POA: Diagnosis not present

## 2023-11-17 DIAGNOSIS — D630 Anemia in neoplastic disease: Secondary | ICD-10-CM | POA: Diagnosis not present

## 2023-11-17 DIAGNOSIS — I739 Peripheral vascular disease, unspecified: Secondary | ICD-10-CM | POA: Diagnosis not present

## 2023-11-17 DIAGNOSIS — I110 Hypertensive heart disease with heart failure: Secondary | ICD-10-CM | POA: Diagnosis not present

## 2023-11-21 DIAGNOSIS — G629 Polyneuropathy, unspecified: Secondary | ICD-10-CM | POA: Diagnosis not present

## 2023-11-21 DIAGNOSIS — E669 Obesity, unspecified: Secondary | ICD-10-CM | POA: Diagnosis not present

## 2023-11-21 DIAGNOSIS — I5022 Chronic systolic (congestive) heart failure: Secondary | ICD-10-CM | POA: Diagnosis not present

## 2023-11-21 DIAGNOSIS — F17220 Nicotine dependence, chewing tobacco, uncomplicated: Secondary | ICD-10-CM | POA: Diagnosis not present

## 2023-11-21 DIAGNOSIS — M1990 Unspecified osteoarthritis, unspecified site: Secondary | ICD-10-CM | POA: Diagnosis not present

## 2023-11-21 DIAGNOSIS — T24311A Burn of third degree of right thigh, initial encounter: Secondary | ICD-10-CM | POA: Diagnosis not present

## 2023-11-21 DIAGNOSIS — Z6833 Body mass index (BMI) 33.0-33.9, adult: Secondary | ICD-10-CM | POA: Diagnosis not present

## 2023-11-21 DIAGNOSIS — J45909 Unspecified asthma, uncomplicated: Secondary | ICD-10-CM | POA: Diagnosis not present

## 2023-11-21 DIAGNOSIS — I69351 Hemiplegia and hemiparesis following cerebral infarction affecting right dominant side: Secondary | ICD-10-CM | POA: Diagnosis not present

## 2023-11-21 DIAGNOSIS — Z86718 Personal history of other venous thrombosis and embolism: Secondary | ICD-10-CM | POA: Diagnosis not present

## 2023-11-21 DIAGNOSIS — E785 Hyperlipidemia, unspecified: Secondary | ICD-10-CM | POA: Diagnosis not present

## 2023-11-21 DIAGNOSIS — Z8546 Personal history of malignant neoplasm of prostate: Secondary | ICD-10-CM | POA: Diagnosis not present

## 2023-11-21 DIAGNOSIS — T25321A Burn of third degree of right foot, initial encounter: Secondary | ICD-10-CM | POA: Diagnosis not present

## 2023-11-21 DIAGNOSIS — I5020 Unspecified systolic (congestive) heart failure: Secondary | ICD-10-CM | POA: Diagnosis not present

## 2023-11-21 DIAGNOSIS — G4733 Obstructive sleep apnea (adult) (pediatric): Secondary | ICD-10-CM | POA: Diagnosis not present

## 2023-11-21 DIAGNOSIS — Z7902 Long term (current) use of antithrombotics/antiplatelets: Secondary | ICD-10-CM | POA: Diagnosis not present

## 2023-11-21 DIAGNOSIS — I110 Hypertensive heart disease with heart failure: Secondary | ICD-10-CM | POA: Diagnosis not present

## 2023-11-21 DIAGNOSIS — Z79899 Other long term (current) drug therapy: Secondary | ICD-10-CM | POA: Diagnosis not present

## 2023-11-21 DIAGNOSIS — I2510 Atherosclerotic heart disease of native coronary artery without angina pectoris: Secondary | ICD-10-CM | POA: Diagnosis not present

## 2023-11-22 ENCOUNTER — Telehealth: Payer: Self-pay | Admitting: Cardiology

## 2023-11-22 NOTE — Telephone Encounter (Signed)
Spoke to wife who stated that pt saw [facility] cardiology on 12/2 and his medication was changed. Pt's wife wanted to make sure this was okay before proceeding with medication changes. Pt's wife questioned why patient was seeing both [facility] and [facility] cardiology and she stated she did not have an answer for it.  Changed:  -Stop Bisoprolol -Start Metoprolol 50 mg once daily -Increase Spironolactone 25 mg once daily.  Please advise.

## 2023-11-22 NOTE — Telephone Encounter (Signed)
Spoke to wife and verbalized recommendations made by provider. Pt's wife voiced understanding. Pt's wife stated that she would tell patient to choose a providing group- either [facility] or [facility] as medication changes and quality of care are impacted by seeing both groups at the same time.

## 2023-11-22 NOTE — Telephone Encounter (Signed)
    I reviewed this with the patient at the time of his office visit in 08/2023 and he wanted to be followed by both groups. We reviewed at that time this could lead to concerns regarding his treatment plan and duplicate medications. I would again recommend being followed by one group instead of both to avoid confusion regarding his medications going forward.   He was taking Spironolactone 25 mg daily at the time of his office visit on 11/03/2023 along with being on [place]. He was on Bisoprolol 5 mg daily and it appears that Toprol-XL 50 mg daily is replacing this. If he wishes to make that switch, they can but cannot be on both Bisoprolol and Metoprolol at the same time as they are in the same class of medications.  Signed, Ellsworth Lennox, PA-C 11/22/2023, 4:17 PM Pager: [phone]

## 2023-11-22 NOTE — Telephone Encounter (Signed)
Pt's wife is trying to speak with the nurse about the medications changes that the [facility] hospital made. Please advise

## 2023-11-23 DIAGNOSIS — I110 Hypertensive heart disease with heart failure: Secondary | ICD-10-CM | POA: Diagnosis not present

## 2023-11-23 DIAGNOSIS — S12100D Unspecified displaced fracture of second cervical vertebra, subsequent encounter for fracture with routine healing: Secondary | ICD-10-CM | POA: Diagnosis not present

## 2023-11-23 DIAGNOSIS — S62102D Fracture of unspecified carpal bone, left wrist, subsequent encounter for fracture with routine healing: Secondary | ICD-10-CM | POA: Diagnosis not present

## 2023-11-23 DIAGNOSIS — T24311D Burn of third degree of right thigh, subsequent encounter: Secondary | ICD-10-CM | POA: Diagnosis not present

## 2023-11-23 DIAGNOSIS — T25321D Burn of third degree of right foot, subsequent encounter: Secondary | ICD-10-CM | POA: Diagnosis not present

## 2023-11-23 DIAGNOSIS — T25331D Burn of third degree of right toe(s) (nail), subsequent encounter: Secondary | ICD-10-CM | POA: Diagnosis not present

## 2023-11-25 DIAGNOSIS — T25331D Burn of third degree of right toe(s) (nail), subsequent encounter: Secondary | ICD-10-CM | POA: Diagnosis not present

## 2023-11-25 DIAGNOSIS — S62102D Fracture of unspecified carpal bone, left wrist, subsequent encounter for fracture with routine healing: Secondary | ICD-10-CM | POA: Diagnosis not present

## 2023-11-25 DIAGNOSIS — S12100D Unspecified displaced fracture of second cervical vertebra, subsequent encounter for fracture with routine healing: Secondary | ICD-10-CM | POA: Diagnosis not present

## 2023-11-25 DIAGNOSIS — T25321D Burn of third degree of right foot, subsequent encounter: Secondary | ICD-10-CM | POA: Diagnosis not present

## 2023-11-25 DIAGNOSIS — T24311D Burn of third degree of right thigh, subsequent encounter: Secondary | ICD-10-CM | POA: Diagnosis not present

## 2023-11-25 DIAGNOSIS — I110 Hypertensive heart disease with heart failure: Secondary | ICD-10-CM | POA: Diagnosis not present

## 2023-11-30 DIAGNOSIS — I110 Hypertensive heart disease with heart failure: Secondary | ICD-10-CM | POA: Diagnosis not present

## 2023-11-30 DIAGNOSIS — T24311D Burn of third degree of right thigh, subsequent encounter: Secondary | ICD-10-CM | POA: Diagnosis not present

## 2023-11-30 DIAGNOSIS — T25321D Burn of third degree of right foot, subsequent encounter: Secondary | ICD-10-CM | POA: Diagnosis not present

## 2023-11-30 DIAGNOSIS — T25331D Burn of third degree of right toe(s) (nail), subsequent encounter: Secondary | ICD-10-CM | POA: Diagnosis not present

## 2023-11-30 DIAGNOSIS — S62102D Fracture of unspecified carpal bone, left wrist, subsequent encounter for fracture with routine healing: Secondary | ICD-10-CM | POA: Diagnosis not present

## 2023-11-30 DIAGNOSIS — S12100D Unspecified displaced fracture of second cervical vertebra, subsequent encounter for fracture with routine healing: Secondary | ICD-10-CM | POA: Diagnosis not present

## 2023-12-02 DIAGNOSIS — S62102D Fracture of unspecified carpal bone, left wrist, subsequent encounter for fracture with routine healing: Secondary | ICD-10-CM | POA: Diagnosis not present

## 2023-12-02 DIAGNOSIS — T25331D Burn of third degree of right toe(s) (nail), subsequent encounter: Secondary | ICD-10-CM | POA: Diagnosis not present

## 2023-12-02 DIAGNOSIS — T25321D Burn of third degree of right foot, subsequent encounter: Secondary | ICD-10-CM | POA: Diagnosis not present

## 2023-12-02 DIAGNOSIS — I110 Hypertensive heart disease with heart failure: Secondary | ICD-10-CM | POA: Diagnosis not present

## 2023-12-02 DIAGNOSIS — T24311D Burn of third degree of right thigh, subsequent encounter: Secondary | ICD-10-CM | POA: Diagnosis not present

## 2023-12-02 DIAGNOSIS — S12100D Unspecified displaced fracture of second cervical vertebra, subsequent encounter for fracture with routine healing: Secondary | ICD-10-CM | POA: Diagnosis not present

## 2023-12-05 DIAGNOSIS — T24311A Burn of third degree of right thigh, initial encounter: Secondary | ICD-10-CM | POA: Diagnosis not present

## 2023-12-05 DIAGNOSIS — T25311A Burn of third degree of right ankle, initial encounter: Secondary | ICD-10-CM | POA: Insufficient documentation

## 2023-12-05 DIAGNOSIS — I5020 Unspecified systolic (congestive) heart failure: Secondary | ICD-10-CM | POA: Diagnosis not present

## 2023-12-06 ENCOUNTER — Ambulatory Visit: Payer: Medicare Other | Admitting: Urology

## 2023-12-06 ENCOUNTER — Ambulatory Visit: Payer: Medicare Other | Admitting: Student

## 2023-12-06 DIAGNOSIS — N138 Other obstructive and reflux uropathy: Secondary | ICD-10-CM

## 2023-12-06 DIAGNOSIS — C61 Malignant neoplasm of prostate: Secondary | ICD-10-CM

## 2023-12-06 DIAGNOSIS — S52502D Unspecified fracture of the lower end of left radius, subsequent encounter for closed fracture with routine healing: Secondary | ICD-10-CM | POA: Diagnosis not present

## 2023-12-07 ENCOUNTER — Ambulatory Visit: Payer: Medicare Other | Admitting: Cardiology

## 2023-12-08 ENCOUNTER — Encounter: Payer: Self-pay | Admitting: Cardiology

## 2023-12-08 ENCOUNTER — Ambulatory Visit: Payer: Medicare Other | Attending: Cardiology | Admitting: Cardiology

## 2023-12-08 VITALS — BP 124/72 | HR 60 | Ht 75.0 in | Wt 270.0 lb

## 2023-12-08 DIAGNOSIS — T25331D Burn of third degree of right toe(s) (nail), subsequent encounter: Secondary | ICD-10-CM | POA: Diagnosis not present

## 2023-12-08 DIAGNOSIS — Z79899 Other long term (current) drug therapy: Secondary | ICD-10-CM | POA: Diagnosis not present

## 2023-12-08 DIAGNOSIS — S12100D Unspecified displaced fracture of second cervical vertebra, subsequent encounter for fracture with routine healing: Secondary | ICD-10-CM | POA: Diagnosis not present

## 2023-12-08 DIAGNOSIS — I5022 Chronic systolic (congestive) heart failure: Secondary | ICD-10-CM | POA: Diagnosis not present

## 2023-12-08 DIAGNOSIS — T25321D Burn of third degree of right foot, subsequent encounter: Secondary | ICD-10-CM | POA: Diagnosis not present

## 2023-12-08 DIAGNOSIS — T24311D Burn of third degree of right thigh, subsequent encounter: Secondary | ICD-10-CM | POA: Diagnosis not present

## 2023-12-08 DIAGNOSIS — I491 Atrial premature depolarization: Secondary | ICD-10-CM | POA: Diagnosis not present

## 2023-12-08 DIAGNOSIS — I10 Essential (primary) hypertension: Secondary | ICD-10-CM | POA: Insufficient documentation

## 2023-12-08 DIAGNOSIS — Z8673 Personal history of transient ischemic attack (TIA), and cerebral infarction without residual deficits: Secondary | ICD-10-CM | POA: Diagnosis not present

## 2023-12-08 DIAGNOSIS — I110 Hypertensive heart disease with heart failure: Secondary | ICD-10-CM | POA: Diagnosis not present

## 2023-12-08 DIAGNOSIS — S62102D Fracture of unspecified carpal bone, left wrist, subsequent encounter for fracture with routine healing: Secondary | ICD-10-CM | POA: Diagnosis not present
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
## 2023-12-08 NOTE — Patient Instructions (Addendum)
Medication Instructions:  Your physician recommends that you continue on your current medications as directed. Please refer to the Current Medication list given to you today.  Labwork: BMET 12/09/2023 @[facility] ([address] [place]) Non-fasting  Testing/Procedures: Your physician has requested that you have an echocardiogram in 3 months just before your next visit. Echocardiography is a painless test that uses sound waves to create images of your heart. It provides your doctor with information about the size and shape of your heart and how well your heart's chambers and valves are working. This procedure takes approximately one hour. There are no restrictions for this procedure. Please do NOT wear cologne, perfume, aftershave, or lotions (deodorant is allowed). Please arrive 15 minutes prior to your appointment time.  Please note: We ask at that you not bring children with you during ultrasound (echo/ vascular) testing. Due to room size and safety concerns, children are not allowed in the ultrasound rooms during exams. Our front office staff cannot provide observation of children in our lobby area while testing is being conducted. An adult accompanying a patient to their appointment will only be allowed in the ultrasound room at the discretion of the ultrasound technician under special circumstances. We apologize for any inconvenience.  Follow-Up: Your physician recommends that you schedule a follow-up appointment in: 3 months  Any Other Special Instructions Will Be Listed Below (If Applicable).  If you need a refill on your cardiac medications before your next appointment, please call your pharmacy.

## 2023-12-09 DIAGNOSIS — S62102D Fracture of unspecified carpal bone, left wrist, subsequent encounter for fracture with routine healing: Secondary | ICD-10-CM | POA: Diagnosis not present

## 2023-12-09 DIAGNOSIS — S12100D Unspecified displaced fracture of second cervical vertebra, subsequent encounter for fracture with routine healing: Secondary | ICD-10-CM | POA: Diagnosis not present

## 2023-12-09 DIAGNOSIS — T24311D Burn of third degree of right thigh, subsequent encounter: Secondary | ICD-10-CM | POA: Diagnosis not present

## 2023-12-09 DIAGNOSIS — Z79899 Other long term (current) drug therapy: Secondary | ICD-10-CM | POA: Diagnosis not present

## 2023-12-09 DIAGNOSIS — I5022 Chronic systolic (congestive) heart failure: Secondary | ICD-10-CM | POA: Diagnosis not present

## 2023-12-09 DIAGNOSIS — T25321D Burn of third degree of right foot, subsequent encounter: Secondary | ICD-10-CM | POA: Diagnosis not present

## 2023-12-09 DIAGNOSIS — I110 Hypertensive heart disease with heart failure: Secondary | ICD-10-CM | POA: Diagnosis not present

## 2023-12-09 DIAGNOSIS — T25331D Burn of third degree of right toe(s) (nail), subsequent encounter: Secondary | ICD-10-CM | POA: Diagnosis not present

## 2023-12-10 LAB — BASIC METABOLIC PANEL
BUN/Creatinine Ratio: 7 — ABNORMAL LOW (ref 10–24)
BUN: 7 mg/dL — ABNORMAL LOW (ref 8–27)
CO2: 25 mmol/L (ref 20–29)
Calcium: 9.9 mg/dL (ref 8.6–10.2)
Chloride: 104 mmol/L (ref 96–106)
Creatinine, Ser: 0.97 mg/dL (ref 0.76–1.27)
Glucose: 84 mg/dL (ref 70–99)
Potassium: 4.3 mmol/L (ref 3.5–5.2)
Sodium: 142 mmol/L (ref 134–144)
eGFR: 80 mL/min/{1.73_m2} (ref >59)

## 2023-12-17 DIAGNOSIS — K219 Gastro-esophageal reflux disease without esophagitis: Secondary | ICD-10-CM | POA: Diagnosis not present

## 2023-12-17 DIAGNOSIS — M1990 Unspecified osteoarthritis, unspecified site: Secondary | ICD-10-CM | POA: Diagnosis not present

## 2023-12-17 DIAGNOSIS — T25321D Burn of third degree of right foot, subsequent encounter: Secondary | ICD-10-CM | POA: Diagnosis not present

## 2023-12-17 DIAGNOSIS — G4700 Insomnia, unspecified: Secondary | ICD-10-CM | POA: Diagnosis not present

## 2023-12-17 DIAGNOSIS — G8220 Paraplegia, unspecified: Secondary | ICD-10-CM | POA: Diagnosis not present

## 2023-12-17 DIAGNOSIS — F32A Depression, unspecified: Secondary | ICD-10-CM | POA: Diagnosis not present

## 2023-12-17 DIAGNOSIS — E538 Deficiency of other specified B group vitamins: Secondary | ICD-10-CM | POA: Diagnosis not present

## 2023-12-17 DIAGNOSIS — F02A3 Dementia in other diseases classified elsewhere, mild, with mood disturbance: Secondary | ICD-10-CM | POA: Diagnosis not present

## 2023-12-17 DIAGNOSIS — F4310 Post-traumatic stress disorder, unspecified: Secondary | ICD-10-CM | POA: Diagnosis not present

## 2023-12-17 DIAGNOSIS — I69351 Hemiplegia and hemiparesis following cerebral infarction affecting right dominant side: Secondary | ICD-10-CM | POA: Diagnosis not present

## 2023-12-17 DIAGNOSIS — F02A4 Dementia in other diseases classified elsewhere, mild, with anxiety: Secondary | ICD-10-CM | POA: Diagnosis not present

## 2023-12-17 DIAGNOSIS — I110 Hypertensive heart disease with heart failure: Secondary | ICD-10-CM | POA: Diagnosis not present

## 2023-12-17 DIAGNOSIS — E785 Hyperlipidemia, unspecified: Secondary | ICD-10-CM | POA: Diagnosis not present

## 2023-12-17 DIAGNOSIS — I2510 Atherosclerotic heart disease of native coronary artery without angina pectoris: Secondary | ICD-10-CM | POA: Diagnosis not present

## 2023-12-17 DIAGNOSIS — S12100D Unspecified displaced fracture of second cervical vertebra, subsequent encounter for fracture with routine healing: Secondary | ICD-10-CM | POA: Diagnosis not present

## 2023-12-17 DIAGNOSIS — I739 Peripheral vascular disease, unspecified: Secondary | ICD-10-CM | POA: Diagnosis not present

## 2023-12-17 DIAGNOSIS — S62102D Fracture of unspecified carpal bone, left wrist, subsequent encounter for fracture with routine healing: Secondary | ICD-10-CM | POA: Diagnosis not present

## 2023-12-17 DIAGNOSIS — E440 Moderate protein-calorie malnutrition: Secondary | ICD-10-CM | POA: Diagnosis not present

## 2023-12-17 DIAGNOSIS — I5022 Chronic systolic (congestive) heart failure: Secondary | ICD-10-CM | POA: Diagnosis not present

## 2023-12-17 DIAGNOSIS — T24311D Burn of third degree of right thigh, subsequent encounter: Secondary | ICD-10-CM | POA: Diagnosis not present

## 2023-12-17 DIAGNOSIS — J45909 Unspecified asthma, uncomplicated: Secondary | ICD-10-CM | POA: Diagnosis not present

## 2023-12-17 DIAGNOSIS — G4733 Obstructive sleep apnea (adult) (pediatric): Secondary | ICD-10-CM | POA: Diagnosis not present

## 2023-12-17 DIAGNOSIS — D630 Anemia in neoplastic disease: Secondary | ICD-10-CM | POA: Diagnosis not present

## 2023-12-17 DIAGNOSIS — C61 Malignant neoplasm of prostate: Secondary | ICD-10-CM | POA: Diagnosis not present

## 2023-12-17 DIAGNOSIS — T25331D Burn of third degree of right toe(s) (nail), subsequent encounter: Secondary | ICD-10-CM | POA: Diagnosis not present

## 2023-12-22 ENCOUNTER — Ambulatory Visit: Payer: Medicare Other | Admitting: Gastroenterology

## 2023-12-22 DIAGNOSIS — T24311D Burn of third degree of right thigh, subsequent encounter: Secondary | ICD-10-CM | POA: Diagnosis not present

## 2023-12-22 DIAGNOSIS — S62102D Fracture of unspecified carpal bone, left wrist, subsequent encounter for fracture with routine healing: Secondary | ICD-10-CM | POA: Diagnosis not present

## 2023-12-22 DIAGNOSIS — T25321D Burn of third degree of right foot, subsequent encounter: Secondary | ICD-10-CM | POA: Diagnosis not present

## 2023-12-22 DIAGNOSIS — S12100D Unspecified displaced fracture of second cervical vertebra, subsequent encounter for fracture with routine healing: Secondary | ICD-10-CM | POA: Diagnosis not present

## 2023-12-22 DIAGNOSIS — I110 Hypertensive heart disease with heart failure: Secondary | ICD-10-CM | POA: Diagnosis not present

## 2023-12-22 DIAGNOSIS — T25331D Burn of third degree of right toe(s) (nail), subsequent encounter: Secondary | ICD-10-CM | POA: Diagnosis not present

## 2023-12-28 DIAGNOSIS — S62102D Fracture of unspecified carpal bone, left wrist, subsequent encounter for fracture with routine healing: Secondary | ICD-10-CM | POA: Diagnosis not present

## 2023-12-28 DIAGNOSIS — S12100D Unspecified displaced fracture of second cervical vertebra, subsequent encounter for fracture with routine healing: Secondary | ICD-10-CM | POA: Diagnosis not present

## 2023-12-28 DIAGNOSIS — I110 Hypertensive heart disease with heart failure: Secondary | ICD-10-CM | POA: Diagnosis not present

## 2023-12-28 DIAGNOSIS — T25321D Burn of third degree of right foot, subsequent encounter: Secondary | ICD-10-CM | POA: Diagnosis not present

## 2023-12-28 DIAGNOSIS — T25331D Burn of third degree of right toe(s) (nail), subsequent encounter: Secondary | ICD-10-CM | POA: Diagnosis not present

## 2023-12-28 DIAGNOSIS — T24311D Burn of third degree of right thigh, subsequent encounter: Secondary | ICD-10-CM | POA: Diagnosis not present

## 2023-12-30 DIAGNOSIS — S12100D Unspecified displaced fracture of second cervical vertebra, subsequent encounter for fracture with routine healing: Secondary | ICD-10-CM | POA: Diagnosis not present

## 2023-12-30 DIAGNOSIS — T25331D Burn of third degree of right toe(s) (nail), subsequent encounter: Secondary | ICD-10-CM | POA: Diagnosis not present

## 2023-12-30 DIAGNOSIS — I110 Hypertensive heart disease with heart failure: Secondary | ICD-10-CM | POA: Diagnosis not present

## 2023-12-30 DIAGNOSIS — S62102D Fracture of unspecified carpal bone, left wrist, subsequent encounter for fracture with routine healing: Secondary | ICD-10-CM | POA: Diagnosis not present

## 2023-12-30 DIAGNOSIS — T25321D Burn of third degree of right foot, subsequent encounter: Secondary | ICD-10-CM | POA: Diagnosis not present

## 2023-12-30 DIAGNOSIS — T24311D Burn of third degree of right thigh, subsequent encounter: Secondary | ICD-10-CM | POA: Diagnosis not present

## 2024-01-02 DIAGNOSIS — I110 Hypertensive heart disease with heart failure: Secondary | ICD-10-CM | POA: Diagnosis not present

## 2024-01-02 DIAGNOSIS — S62102D Fracture of unspecified carpal bone, left wrist, subsequent encounter for fracture with routine healing: Secondary | ICD-10-CM | POA: Diagnosis not present

## 2024-01-02 DIAGNOSIS — T25321D Burn of third degree of right foot, subsequent encounter: Secondary | ICD-10-CM | POA: Diagnosis not present

## 2024-01-02 DIAGNOSIS — T24311D Burn of third degree of right thigh, subsequent encounter: Secondary | ICD-10-CM | POA: Diagnosis not present

## 2024-01-02 DIAGNOSIS — S12100D Unspecified displaced fracture of second cervical vertebra, subsequent encounter for fracture with routine healing: Secondary | ICD-10-CM | POA: Diagnosis not present

## 2024-01-02 DIAGNOSIS — T25331D Burn of third degree of right toe(s) (nail), subsequent encounter: Secondary | ICD-10-CM | POA: Diagnosis not present

## 2024-01-04 DIAGNOSIS — I5020 Unspecified systolic (congestive) heart failure: Secondary | ICD-10-CM | POA: Diagnosis not present

## 2024-01-04 DIAGNOSIS — T24311A Burn of third degree of right thigh, initial encounter: Secondary | ICD-10-CM | POA: Diagnosis not present

## 2024-01-05 ENCOUNTER — Encounter: Payer: Self-pay | Admitting: Gastroenterology

## 2024-01-05 ENCOUNTER — Ambulatory Visit (INDEPENDENT_AMBULATORY_CARE_PROVIDER_SITE_OTHER): Payer: Medicare Other | Admitting: Gastroenterology

## 2024-01-05 VITALS — BP 104/63 | HR 44 | Temp 97.5°F | Ht 75.0 in | Wt 270.0 lb

## 2024-01-05 DIAGNOSIS — K219 Gastro-esophageal reflux disease without esophagitis: Secondary | ICD-10-CM

## 2024-01-05 DIAGNOSIS — R630 Anorexia: Secondary | ICD-10-CM

## 2024-01-05 DIAGNOSIS — R1310 Dysphagia, unspecified: Secondary | ICD-10-CM

## 2024-01-05 DIAGNOSIS — K5900 Constipation, unspecified: Secondary | ICD-10-CM

## 2024-01-05 NOTE — Patient Instructions (Addendum)
We will schedule you for a barium pill esophagram (a special x-ray to evaluate your swallowing).  Our office will reach out to you to let you know the date and time for this.  You may continue to take the Linzess 290 mcg daily if needed.  Continue pantoprazole 40 mg once daily.  To help with the swallowing: -Follow a soft diet -Ensure you are chewing your food adequately prior swallowing -Alternate with sips of liquids with meals. -Try to avoid large gulping of liquids and would recommend sipping on a cup versus using a straw.  We will follow-up in 3 months.  It was a pleasure to see you today. I want to create trusting relationships with patients. If you receive a survey regarding your visit,  I greatly appreciate you taking time to fill this out on paper or through your MyChart. I value your feedback.  Brooke Bonito, MSN, FNP-BC, AGACNP-BC [facility]

## 2024-01-05 NOTE — Progress Notes (Signed)
GI Office Note    Referring Provider: Carylon Perches, MD Primary Care Physician:  Carylon Perches, MD Primary Gastroenterologist: Gerrit Friends.Rourk, MD  Date:  01/05/2024  ID:  Cody Velez, DOB 09/15/1946, MRN [id number]   Chief Complaint   Chief Complaint  Patient presents with   Follow-up    History of Present Illness  Cody Velez is a 78 y.o. male with a history of GERD, B12 deficiency, CAD, dementia, depression, HTN, OSA on CPAP, HLD, prostate cancer, stroke in 2022 presenting today for follow-up of constipation and reflux.  Colonoscopy in September 2023 with multiple 3-25 mm in the ascending colon and cecum and markedly elongated and redundant colon.  Largest 25 mm polyp in the cecum removed in piecemeal fashion.  Recommended 71-month surveillance due to piecemeal polypectomy.  All revealed to be tubular adenomas.    OV 01/24/2023.  GERD reportedly well-controlled on omeprazole 40 mg once daily and some occasional dysphagia.  Still having issues with constipation.  Did not try any fiber supplement.  Stated he was going 1-2 times daily until he " ran out of the blue pill".  Since he has been out of his medication he has not been having good bowel movements.  Taking daily probiotic.  Denied any straining.  He was rescheduled for colonoscopy with extended prep.  Provided samples of Linzess 290 mcg advised to call with an update if unable to obtain medications.  Advised to continue omeprazole 40 mg once daily.    Colonoscopy 02/23/2023: -12 mm polyp in the rectum -Redundant/elongated colon -6 mm polyp in the proximal ascending colon -Pathology revealed tubular adenomas (large polyp stalk free of adenomatous change) -Recommended repeat colonoscopy in 3 years.  Last office visit 06/14/2023.  GERD well-controlled with omeprazole.  Denied any nausea, vomiting, or dysphagia.  Having 1-2 bowel movements per day stating a good size and without needing to strain but does not know when he goes.  He  feels the urge and then states he looks in the commode and is there.  Denied any melena or bright BPR.  Also denied any abdominal pain.  Reports mostly not having a good appetite but is able to eat ice cream and chocolate, does not like the taste of most foods.  Did report a fall the day prior to visit and had some burns from the pavement.  Advised to continue Linzess 290 mcg and increase to daily and advised to call with a progress report if having too many looser stools.  Advised he may use a suppository for rectal stimulation if Linzess is too effective.  Due for repeat colonoscopy in 2027.  Continue omeprazole 40 mg once daily.  Follow-up in 6 months..  Today: Significant other accompanies him.  Has not been taking the Linzess much at all. Pretty much having a daily bowel movement. He states they are large caliber stools but not having to strain. Still not able to feel that he is going to the bathroom. He at times has accidents but has not had many recently. Denies blood or black stool. Denies abdominal pain.   Does not have a great appetite, possible slightly worse. Does not get excited about eating anything. He does have some dysphagia for about a year, occurring about every other day. Gets choked easily with liquids, no issues with medications. Does have some trouble with solids.  Denies a taste issue. No teeth issues. Denies nausea. About once a week he eats ice cream. No weight loss that he  is aware of but has not weighed a few months - clothes fitting the same.   Reflux is controled with medication.    Wt Readings from Last 3 Encounters:  01/05/24 270 lb (122.5 kg)  12/08/23 270 lb (122.5 kg)  11/03/23 270 lb (122.5 kg)    Current Outpatient Medications  Medication Sig Dispense Refill   acetaminophen (TYLENOL) 325 MG tablet Take 2 tablets (650 mg total) by mouth every 6 (six) hours as needed for mild pain (pain score 1-3) (or Fever >/= 101).     alfuzosin (UROXATRAL) 10 MG 24 hr tablet  Take 10 mg by mouth daily with breakfast.     ARIPiprazole (ABILIFY) 5 MG tablet Take 5 mg by mouth daily.     Cholecalciferol (VITAMIN D) 50 MCG (2000 UT) CAPS Take 2,000 Units by mouth daily.     clopidogrel (PLAVIX) 75 MG tablet Take 1 tablet (75 mg total) by mouth daily. 30 tablet 2   cyanocobalamin (VITAMIN B12) 500 MCG tablet Take 500 mcg by mouth daily.     diclofenac Sodium (VOLTAREN) 1 % GEL Apply topically 4 (four) times daily.     empagliflozin (JARDIANCE) 10 MG TABS tablet Take 1 tablet (10 mg total) by mouth daily before breakfast. 30 tablet 11   ferrous gluconate (FERGON) 324 MG tablet Take 324 mg by mouth daily with breakfast.     finasteride (PROSCAR) 5 MG tablet Take 1 tablet (5 mg total) by mouth daily. Reported on 05/04/2016 30 tablet 0   fluticasone (FLONASE) 50 MCG/ACT nasal spray Place 1 spray into both nostrils daily as needed for allergies. 16 g 2   furosemide (LASIX) 20 MG tablet Take 1 tablet (20 mg total) by mouth daily as needed for edema. 30 tablet 11   gabapentin (NEURONTIN) 100 MG capsule Take 100 mg by mouth at bedtime.     linaclotide (LINZESS) 290 MCG CAPS capsule Take 290 mcg by mouth as needed.     metoprolol succinate (TOPROL-XL) 50 MG 24 hr tablet Take 1 tablet by mouth daily.     mupirocin ointment (BACTROBAN) 2 % Apply 1 Application topically daily.     Oxycodone HCl 10 MG TABS Take 1 tablet (10 mg total) by mouth every 6 (six) hours as needed (pain). 15 tablet 0   pantoprazole (PROTONIX) 40 MG tablet Take 1 tablet by mouth daily.     rosuvastatin (CRESTOR) 20 MG tablet Take 20 mg by mouth daily.     sacubitril-valsartan (ENTRESTO) 49-51 MG Take 1 tablet by mouth 2 (two) times daily. 180 tablet 3   spironolactone (ALDACTONE) 25 MG tablet Take 25 mg by mouth daily.     traZODone (DESYREL) 100 MG tablet Take 0.5 tablets (50 mg total) by mouth at bedtime. 30 tablet 0   venlafaxine XR (EFFEXOR-XR) 150 MG 24 hr capsule Take 1 capsule (150 mg total) by mouth 2  (two) times daily. 60 capsule 0   No current facility-administered medications for this visit.    Past Medical History:  Diagnosis Date   Allergic rhinitis    Arthritis    B12 deficiency    Cervicogenic headache    Childhood asthma    Chronic back pain    Coronary atherosclerosis of native coronary artery    Mild nonobstructive CAD at catheterization January 2015   Dementia [facility])    Depression    Essential hypertension    Falls    GERD (gastroesophageal reflux disease)    Glaucoma  History of blood transfusion    History of cerebrovascular disease 07/23/2015   History of kidney stones    History of pneumonia 02/2011   Hyperlipidemia    OSA (obstructive sleep apnea)    CPAP - Dr. Shelle Iron - uses cpap every night   Pneumonia    Prostate cancer [facility])    PTSD (post-traumatic stress disorder)    [place]   Rectal bleeding    Stroke (HCC) 03/2021   Wheelchair bound     Past Surgical History:  Procedure Laterality Date   APPLICATION OF ROBOTIC ASSISTANCE FOR SPINAL PROCEDURE N/A 03/28/2018   Procedure: APPLICATION OF ROBOTIC ASSISTANCE FOR SPINAL PROCEDURE;  Surgeon: Barnett Abu, MD;  Location: [facility] OR;  Service: Neurosurgery;  Laterality: N/A;   BACK SURGERY  02/14/2012   lumbar OR #7; "today redid L1L2; replaced screws; added bone from hip"   BILATERAL KNEE ARTHROSCOPY     COLONOSCOPY  10/15/2008   Dr. Jena Gauss: tubular adenoma    COLONOSCOPY  12/17/2003   WUJ:[id number] rectal and colon   COLONOSCOPY N/A 09/05/2014   tubular adenoma   COLONOSCOPY WITH PROPOFOL N/A 09/15/2022   Multiple 3-25 mm polyps in ascending colon and cecum, markedly elongated and redundant colon. One polyp removed piecemeal fashion. 6 month surveillance recommended. Tubular adenomas.   COLONOSCOPY WITH PROPOFOL N/A 02/23/2023   Procedure: COLONOSCOPY WITH PROPOFOL;  Surgeon: Corbin Ade, MD;  Location: [facility];  Service: Endoscopy;  Laterality: N/A;  730am, asa 3   CYSTOSCOPY WITH STENT PLACEMENT  Right 01/27/2016   Procedure: CYSTOSCOPY WITH STENT PLACEMENT;  Surgeon: Marcine Matar, MD;  Location: [facility] ORS;  Service: Urology;  Laterality: Right;   CYSTOSCOPY/RETROGRADE/URETEROSCOPY/STONE EXTRACTION WITH BASKET Right 01/27/2016   Procedure: CYSTOSCOPY, RIGHT RETROGRADE, RIGHT URETEROSCOPY, STONE EXTRACTION ;  Surgeon: Marcine Matar, MD;  Location: [facility];  Service: Urology;  Laterality: Right;   ESOPHAGOGASTRODUODENOSCOPY  10/15/2008   Dr Rourk:Schatzki's ring status post dilation and disruption via 39 F Maloney dilator/ otherwise unremarkable esophagus, small hiatal hernia, multiple fundal gland polyps not manipulated, gastritis, negative H.pylori   ESOPHAGOGASTRODUODENOSCOPY  06/21/2002   NWG:NFAOZ sliding hiatal hernia with mild changes of reflux esophagitis limited to gastroesophageal junction.  Noncritical ring at distal esophagus, 3 cm proximal to gastroesophageal junction/Antral gastritis   FACIAL COSMETIC SURGERY  ` 1985   "broke face playing softball"   FLEXIBLE SIGMOIDOSCOPY N/A 07/28/2022   Procedure: FLEXIBLE SIGMOIDOSCOPY;  Surgeon: Corbin Ade, MD;  [facility];  Service: Endoscopy;  Laterality: N/A;   FRACTURE SURGERY     "left wrist; broke it; took spur off"   HEMOSTASIS CLIP PLACEMENT  02/23/2023   Procedure: HEMOSTASIS CLIP PLACEMENT;  Surgeon: Corbin Ade, MD;  [facility];  Service: Endoscopy;;   HOLMIUM LASER APPLICATION Right 01/27/2016   Procedure: HOLMIUM LASER APPLICATION;  Surgeon: Marcine Matar, MD;  Location: [facility];  Service: Urology;  Laterality: Right;   KNEE ARTHROSCOPY Right 05/18/2018   Procedure: PARTIAL MEDIAL MENISECTOMY AND SURGICAL LAVAGE AND CHONDROPLASTY;  Surgeon: Teryl Lucy, MD;  Location: [facility] OR;  Service: Orthopedics;  Laterality: Right;   LEFT HEART CATHETERIZATION WITH CORONARY ANGIOGRAM N/A 12/27/2013   Procedure: LEFT HEART CATHETERIZATION WITH CORONARY ANGIOGRAM;  Surgeon: Peter M Swaz[place]and, MD;   Location: [facility] CATH LAB;  Service: Cardiovascular;  Laterality: N/A;   neck epidural     POLYPECTOMY  09/15/2022   Procedure: POLYPECTOMY;  Surgeon: Corbin Ade, MD;  [facility];  Service: Endoscopy;;  POLYPECTOMY  02/23/2023   Procedure: POLYPECTOMY INTESTINAL;  Surgeon: Corbin Ade, MD;  [facility];  Service: Endoscopy;;   POSTERIOR LUMBAR FUSION 4 LEVEL N/A 03/28/2018   Procedure: Thoracic eight -Lumbar two- FIXATION WITH SCREW PLACEMENT, DECOMPRESSION Thoracic ten-Thoracic eleven  FOR OSTEOMYELITIS;  Surgeon: Barnett Abu, MD;  Location: [facility] OR;  Service: Neurosurgery;  Laterality: N/A;   SCLEROTHERAPY  09/15/2022   Procedure: SCLEROTHERAPY;  Surgeon: Corbin Ade, MD;  [facility];  Service: Endoscopy;;   SHOULDER SURGERY Bilateral    SPINAL CORD STIMULATOR REMOVAL N/A 02/25/2020   Procedure: LUMBAR SPINAL CORD STIMULATOR REMOVAL;  Surgeon: Barnett Abu, MD;  Location: [facility] OR;  Service: Neurosurgery;  Laterality: N/A;   TEE WITHOUT CARDIOVERSION N/A 03/21/2018   Procedure: TRANSESOPHAGEAL ECHOCARDIOGRAM (TEE) WITH PROPOFOL;  Surgeon: Jonelle Sidle, MD;  [facility];  Service: Cardiovascular;  Laterality: N/A;    Family History  Problem Relation Age of Onset   Emphysema Father    Heart failure Father    Lung cancer Father    CAD Father    Colon cancer Mother    Stroke Mother    Breast cancer Mother    Stroke Sister    Heart attack Brother    Dementia Paternal Uncle    Emphysema Maternal Grandmother    Stroke Maternal Grandmother    Asthma Other        grandson   Heart disease Paternal Grandfather    Anesthesia problems Neg Hx    Hypotension Neg Hx    Malignant hyperthermia Neg Hx    Pseudochol deficiency Neg Hx     Allergies as of 01/05/2024 - Review Complete 01/05/2024  Allergen Reaction Noted   Lisinopril Cough 07/05/2014    Social History   Socioeconomic History   Marital status: Widowed    Spouse  name: Meriam Sprague   Number of children: 3   Years of education: Not on file   Highest education level: Not on file  Occupational History   Occupation: Research scientist (life sciences)    Employer: [employer]  Tobacco Use   Smoking status: Former    Current packs/day: 0.00    Types: Cigarettes    Quit date: 12/20/1958    Years since quitting: 65.0   Smokeless tobacco: Current    Types: Chew  Vaping Use   Vaping status: Never Used  Substance and Sexual Activity   Alcohol use: Not Currently    Alcohol/week: 0.0 standard drinks of alcohol    Comment: couple bottles of wine per month   Drug use: No    Comment: "last used marijuana ~ 1969"   Sexual activity: Not on file  Other Topics Concern   Not on file  Social History Narrative   Married, 3 daughters; retired   Patient drinks about 1 cup of coffee daily.   Patient is left handed.   Social Drivers of Corporate investment banker Strain: Low Risk  (07/14/2023)   Received [facility]   Overall Financial Resource Strain (CARDIA)    Difficulty of Paying Living Expenses: Not very hard  Food Insecurity: No Food Insecurity (10/05/2023)   Hunger Vital Sign    Worried About Running Out of Food in the Last Year: Never true    Ran Out of Food in the Last Year: Never true  Transportation Needs: No Transportation Needs (10/05/2023)   PRAPARE - Administrator, Civil Service (Medical): No    Lack of Transportation (Non-Medical):  No  Physical Activity: Not on file  Stress: Not on file  Social Connections: Unknown (05/03/2022)   [facility]   Social Network    Social Network: Not on file    Review of Systems   Gen: Denies fever, chills, anorexia. Denies fatigue, weakness, weight loss.  CV: Denies chest pain, palpitations, syncope, peripheral edema, and claudication. Resp: Denies dyspnea at rest, cough, wheezing, coughing up blood, and pleurisy. GI: See HPI Derm: Denies rash, itching, dry skin Psych: Denies depression,  anxiety, memory loss, confusion. No homicidal or suicidal ideation.  Heme: Denies bruising, bleeding, and enlarged lymph nodes.  Physical Exam   BP 104/63 (BP Location: Right Arm, Patient Position: Sitting, Cuff Size: Large)   Pulse (!) 44   Temp (!) 97.5 F (36.4 C) (Oral)   Ht 6\' 3"  (1.905 m)   Wt 270 lb (122.5 kg) Comment: pt reported  SpO2 95%   BMI 33.75 kg/m   General:   Alert and oriented. No distress noted. Pleasant and cooperative.  Head:  Normocephalic and atraumatic. Eyes:  Conjuctiva clear without scleral icterus. Mouth:  Oral mucosa pink and moist. Good dentition. No lesions. Abdomen:  +BS, soft, non-tender and non-distended but rounded. No rebound or guarding. No HSM or masses noted. Rectal: deferred Msk:  UTA, patient in motorized wheelchair Extremities:  Without edema. Neurologic:  Alert and  oriented x4 Psych:  Alert and cooperative. Normal mood and affect.  Assessment  Cody Velez is a 78 y.o. male with a history of GERD, B12 deficiency, CAD, dementia, depression, HTN, OSA on CPAP, HLD, prostate cancer, stroke in 2022 presenting today for follow-up.  Constipation: Currently only using Linzess as needed and has not needed this recently.  Stools have been large in caliber but soft overall and does not need to strain.  There are times when he has accidents and unable to feel that he has to go and at times while on the commode he is not able to feel that he has went.  Denies any rectal pain or lower abdominal pain.  Also denies any worsening abdominal distention.  GERD, lack of appetite: Typical GERD symptoms well-controlled with pantoprazole 40 mg once daily.  Denies any nausea or vomiting.  Continues to have lack of appetite however weight has been stable.  Dysphagia: Has reported intermittent symptoms for about a year, more recently has been experiencing more liquid dysphagia and some occasional solid food dysphagia with feeling like he is choking with drinking  and eating.  Symptoms occurring about every other day.  Denies any odynophagia.  Symptoms could be secondary to some dysmotility however to rule out any potential stricture/stenosis we will perform a barium pill esophagram.  If evidence of structural narrowing we discussed potentially performing an upper endoscopy.  If negative we still could pursue an upper endoscopy however may would benefit from manometry in the future.  PLAN   Continue Protonix 40 mg daily Continue Linzess 290 mcg BPE for dysphagia Dysphagia precaution discussed Follow up in 3 months.   Brooke Bonito, MSN, FNP-BC, AGACNP-BC [facility]

## 2024-01-06 DIAGNOSIS — S52502D Unspecified fracture of the lower end of left radius, subsequent encounter for closed fracture with routine healing: Secondary | ICD-10-CM | POA: Diagnosis not present

## 2024-01-06 DIAGNOSIS — S62102D Fracture of unspecified carpal bone, left wrist, subsequent encounter for fracture with routine healing: Secondary | ICD-10-CM | POA: Diagnosis not present

## 2024-01-06 DIAGNOSIS — T25321D Burn of third degree of right foot, subsequent encounter: Secondary | ICD-10-CM | POA: Diagnosis not present

## 2024-01-06 DIAGNOSIS — I110 Hypertensive heart disease with heart failure: Secondary | ICD-10-CM | POA: Diagnosis not present

## 2024-01-06 DIAGNOSIS — T25331D Burn of third degree of right toe(s) (nail), subsequent encounter: Secondary | ICD-10-CM | POA: Diagnosis not present

## 2024-01-06 DIAGNOSIS — S52502A Unspecified fracture of the lower end of left radius, initial encounter for closed fracture: Secondary | ICD-10-CM | POA: Diagnosis not present

## 2024-01-06 DIAGNOSIS — T24311D Burn of third degree of right thigh, subsequent encounter: Secondary | ICD-10-CM | POA: Diagnosis not present

## 2024-01-06 DIAGNOSIS — S12100D Unspecified displaced fracture of second cervical vertebra, subsequent encounter for fracture with routine healing: Secondary | ICD-10-CM | POA: Diagnosis not present

## 2024-01-10 ENCOUNTER — Other Ambulatory Visit: Payer: Medicare Other

## 2024-01-11 ENCOUNTER — Ambulatory Visit (HOSPITAL_COMMUNITY)
Admission: RE | Admit: 2024-01-11 | Discharge: 2024-01-11 | Disposition: A | Discharge status: Home / Self Care | Payer: No Typology Code available for payment source | Source: Ambulatory Visit | Attending: Gastroenterology | Admitting: Gastroenterology

## 2024-01-11 DIAGNOSIS — R1310 Dysphagia, unspecified: Secondary | ICD-10-CM | POA: Diagnosis not present

## 2024-01-11 DIAGNOSIS — K219 Gastro-esophageal reflux disease without esophagitis: Secondary | ICD-10-CM | POA: Insufficient documentation

## 2024-01-11 DIAGNOSIS — K224 Dyskinesia of esophagus: Secondary | ICD-10-CM | POA: Diagnosis not present

## 2024-01-12 DIAGNOSIS — S12100D Unspecified displaced fracture of second cervical vertebra, subsequent encounter for fracture with routine healing: Secondary | ICD-10-CM | POA: Diagnosis not present

## 2024-01-12 DIAGNOSIS — S62102D Fracture of unspecified carpal bone, left wrist, subsequent encounter for fracture with routine healing: Secondary | ICD-10-CM | POA: Diagnosis not present

## 2024-01-12 DIAGNOSIS — T25331D Burn of third degree of right toe(s) (nail), subsequent encounter: Secondary | ICD-10-CM | POA: Diagnosis not present

## 2024-01-12 DIAGNOSIS — T25321D Burn of third degree of right foot, subsequent encounter: Secondary | ICD-10-CM | POA: Diagnosis not present

## 2024-01-12 DIAGNOSIS — I110 Hypertensive heart disease with heart failure: Secondary | ICD-10-CM | POA: Diagnosis not present

## 2024-01-12 DIAGNOSIS — T24311D Burn of third degree of right thigh, subsequent encounter: Secondary | ICD-10-CM | POA: Diagnosis not present

## 2024-01-13 DIAGNOSIS — S62102D Fracture of unspecified carpal bone, left wrist, subsequent encounter for fracture with routine healing: Secondary | ICD-10-CM | POA: Diagnosis not present

## 2024-01-13 DIAGNOSIS — I110 Hypertensive heart disease with heart failure: Secondary | ICD-10-CM | POA: Diagnosis not present

## 2024-01-13 DIAGNOSIS — T25331D Burn of third degree of right toe(s) (nail), subsequent encounter: Secondary | ICD-10-CM | POA: Diagnosis not present

## 2024-01-13 DIAGNOSIS — T25321D Burn of third degree of right foot, subsequent encounter: Secondary | ICD-10-CM | POA: Diagnosis not present

## 2024-01-13 DIAGNOSIS — S12100D Unspecified displaced fracture of second cervical vertebra, subsequent encounter for fracture with routine healing: Secondary | ICD-10-CM | POA: Diagnosis not present

## 2024-01-13 DIAGNOSIS — T24311D Burn of third degree of right thigh, subsequent encounter: Secondary | ICD-10-CM | POA: Diagnosis not present

## 2024-01-16 ENCOUNTER — Other Ambulatory Visit: Payer: Self-pay | Admitting: *Deleted

## 2024-01-16 DIAGNOSIS — K219 Gastro-esophageal reflux disease without esophagitis: Secondary | ICD-10-CM | POA: Diagnosis not present

## 2024-01-16 DIAGNOSIS — C61 Malignant neoplasm of prostate: Secondary | ICD-10-CM | POA: Diagnosis not present

## 2024-01-16 DIAGNOSIS — D630 Anemia in neoplastic disease: Secondary | ICD-10-CM | POA: Diagnosis not present

## 2024-01-16 DIAGNOSIS — E440 Moderate protein-calorie malnutrition: Secondary | ICD-10-CM | POA: Diagnosis not present

## 2024-01-16 DIAGNOSIS — G8220 Paraplegia, unspecified: Secondary | ICD-10-CM | POA: Diagnosis not present

## 2024-01-16 DIAGNOSIS — J45909 Unspecified asthma, uncomplicated: Secondary | ICD-10-CM | POA: Diagnosis not present

## 2024-01-16 DIAGNOSIS — F02A4 Dementia in other diseases classified elsewhere, mild, with anxiety: Secondary | ICD-10-CM | POA: Diagnosis not present

## 2024-01-16 DIAGNOSIS — S12100D Unspecified displaced fracture of second cervical vertebra, subsequent encounter for fracture with routine healing: Secondary | ICD-10-CM | POA: Diagnosis not present

## 2024-01-16 DIAGNOSIS — F02A3 Dementia in other diseases classified elsewhere, mild, with mood disturbance: Secondary | ICD-10-CM | POA: Diagnosis not present

## 2024-01-16 DIAGNOSIS — E785 Hyperlipidemia, unspecified: Secondary | ICD-10-CM | POA: Diagnosis not present

## 2024-01-16 DIAGNOSIS — E538 Deficiency of other specified B group vitamins: Secondary | ICD-10-CM | POA: Diagnosis not present

## 2024-01-16 DIAGNOSIS — T25321D Burn of third degree of right foot, subsequent encounter: Secondary | ICD-10-CM | POA: Diagnosis not present

## 2024-01-16 DIAGNOSIS — F32A Depression, unspecified: Secondary | ICD-10-CM | POA: Diagnosis not present

## 2024-01-16 DIAGNOSIS — I739 Peripheral vascular disease, unspecified: Secondary | ICD-10-CM | POA: Diagnosis not present

## 2024-01-16 DIAGNOSIS — F4310 Post-traumatic stress disorder, unspecified: Secondary | ICD-10-CM | POA: Diagnosis not present

## 2024-01-16 DIAGNOSIS — R1310 Dysphagia, unspecified: Secondary | ICD-10-CM

## 2024-01-16 DIAGNOSIS — I5022 Chronic systolic (congestive) heart failure: Secondary | ICD-10-CM | POA: Diagnosis not present

## 2024-01-16 DIAGNOSIS — G4733 Obstructive sleep apnea (adult) (pediatric): Secondary | ICD-10-CM | POA: Diagnosis not present

## 2024-01-16 DIAGNOSIS — S62102D Fracture of unspecified carpal bone, left wrist, subsequent encounter for fracture with routine healing: Secondary | ICD-10-CM | POA: Diagnosis not present

## 2024-01-16 DIAGNOSIS — T24311D Burn of third degree of right thigh, subsequent encounter: Secondary | ICD-10-CM | POA: Diagnosis not present

## 2024-01-16 DIAGNOSIS — E669 Obesity, unspecified: Secondary | ICD-10-CM | POA: Diagnosis not present

## 2024-01-16 DIAGNOSIS — I69351 Hemiplegia and hemiparesis following cerebral infarction affecting right dominant side: Secondary | ICD-10-CM | POA: Diagnosis not present

## 2024-01-16 DIAGNOSIS — G4700 Insomnia, unspecified: Secondary | ICD-10-CM | POA: Diagnosis not present

## 2024-01-16 DIAGNOSIS — I2510 Atherosclerotic heart disease of native coronary artery without angina pectoris: Secondary | ICD-10-CM | POA: Diagnosis not present

## 2024-01-16 DIAGNOSIS — I110 Hypertensive heart disease with heart failure: Secondary | ICD-10-CM | POA: Diagnosis not present

## 2024-01-16 DIAGNOSIS — M1990 Unspecified osteoarthritis, unspecified site: Secondary | ICD-10-CM | POA: Diagnosis not present

## 2024-01-19 DIAGNOSIS — S12100D Unspecified displaced fracture of second cervical vertebra, subsequent encounter for fracture with routine healing: Secondary | ICD-10-CM | POA: Diagnosis not present

## 2024-01-19 DIAGNOSIS — T25321D Burn of third degree of right foot, subsequent encounter: Secondary | ICD-10-CM | POA: Diagnosis not present

## 2024-01-19 DIAGNOSIS — S62102D Fracture of unspecified carpal bone, left wrist, subsequent encounter for fracture with routine healing: Secondary | ICD-10-CM | POA: Diagnosis not present

## 2024-01-19 DIAGNOSIS — T24311D Burn of third degree of right thigh, subsequent encounter: Secondary | ICD-10-CM | POA: Diagnosis not present

## 2024-01-19 DIAGNOSIS — I5022 Chronic systolic (congestive) heart failure: Secondary | ICD-10-CM | POA: Diagnosis not present

## 2024-01-19 DIAGNOSIS — I110 Hypertensive heart disease with heart failure: Secondary | ICD-10-CM | POA: Diagnosis not present

## 2024-01-20 DIAGNOSIS — I5022 Chronic systolic (congestive) heart failure: Secondary | ICD-10-CM | POA: Diagnosis not present

## 2024-01-20 DIAGNOSIS — T25321D Burn of third degree of right foot, subsequent encounter: Secondary | ICD-10-CM | POA: Diagnosis not present

## 2024-01-20 DIAGNOSIS — S62102D Fracture of unspecified carpal bone, left wrist, subsequent encounter for fracture with routine healing: Secondary | ICD-10-CM | POA: Diagnosis not present

## 2024-01-20 DIAGNOSIS — I110 Hypertensive heart disease with heart failure: Secondary | ICD-10-CM | POA: Diagnosis not present

## 2024-01-20 DIAGNOSIS — T24311D Burn of third degree of right thigh, subsequent encounter: Secondary | ICD-10-CM | POA: Diagnosis not present

## 2024-01-20 DIAGNOSIS — S12100D Unspecified displaced fracture of second cervical vertebra, subsequent encounter for fracture with routine healing: Secondary | ICD-10-CM | POA: Diagnosis not present

## 2024-01-23 DIAGNOSIS — M545 Low back pain, unspecified: Secondary | ICD-10-CM | POA: Diagnosis not present

## 2024-01-23 DIAGNOSIS — M160 Bilateral primary osteoarthritis of hip: Secondary | ICD-10-CM | POA: Diagnosis not present

## 2024-01-23 DIAGNOSIS — G8929 Other chronic pain: Secondary | ICD-10-CM | POA: Diagnosis not present

## 2024-01-26 DIAGNOSIS — T25321D Burn of third degree of right foot, subsequent encounter: Secondary | ICD-10-CM | POA: Diagnosis not present

## 2024-01-26 DIAGNOSIS — T24311D Burn of third degree of right thigh, subsequent encounter: Secondary | ICD-10-CM | POA: Diagnosis not present

## 2024-01-26 DIAGNOSIS — I5022 Chronic systolic (congestive) heart failure: Secondary | ICD-10-CM | POA: Diagnosis not present

## 2024-01-26 DIAGNOSIS — S12100D Unspecified displaced fracture of second cervical vertebra, subsequent encounter for fracture with routine healing: Secondary | ICD-10-CM | POA: Diagnosis not present

## 2024-01-26 DIAGNOSIS — I110 Hypertensive heart disease with heart failure: Secondary | ICD-10-CM | POA: Diagnosis not present

## 2024-01-26 DIAGNOSIS — S62102D Fracture of unspecified carpal bone, left wrist, subsequent encounter for fracture with routine healing: Secondary | ICD-10-CM | POA: Diagnosis not present

## 2024-01-27 DIAGNOSIS — I110 Hypertensive heart disease with heart failure: Secondary | ICD-10-CM | POA: Diagnosis not present

## 2024-01-27 DIAGNOSIS — T25321D Burn of third degree of right foot, subsequent encounter: Secondary | ICD-10-CM | POA: Diagnosis not present

## 2024-01-27 DIAGNOSIS — I5022 Chronic systolic (congestive) heart failure: Secondary | ICD-10-CM | POA: Diagnosis not present

## 2024-01-27 DIAGNOSIS — S12100D Unspecified displaced fracture of second cervical vertebra, subsequent encounter for fracture with routine healing: Secondary | ICD-10-CM | POA: Diagnosis not present

## 2024-01-27 DIAGNOSIS — S62102D Fracture of unspecified carpal bone, left wrist, subsequent encounter for fracture with routine healing: Secondary | ICD-10-CM | POA: Diagnosis not present

## 2024-01-27 DIAGNOSIS — T24311D Burn of third degree of right thigh, subsequent encounter: Secondary | ICD-10-CM | POA: Diagnosis not present

## 2024-01-30 DIAGNOSIS — S12100D Unspecified displaced fracture of second cervical vertebra, subsequent encounter for fracture with routine healing: Secondary | ICD-10-CM | POA: Diagnosis not present

## 2024-01-30 DIAGNOSIS — T25321D Burn of third degree of right foot, subsequent encounter: Secondary | ICD-10-CM | POA: Diagnosis not present

## 2024-01-30 DIAGNOSIS — I110 Hypertensive heart disease with heart failure: Secondary | ICD-10-CM | POA: Diagnosis not present

## 2024-01-30 DIAGNOSIS — T24311D Burn of third degree of right thigh, subsequent encounter: Secondary | ICD-10-CM | POA: Diagnosis not present

## 2024-01-30 DIAGNOSIS — I5022 Chronic systolic (congestive) heart failure: Secondary | ICD-10-CM | POA: Diagnosis not present

## 2024-01-30 DIAGNOSIS — S62102D Fracture of unspecified carpal bone, left wrist, subsequent encounter for fracture with routine healing: Secondary | ICD-10-CM | POA: Diagnosis not present

## 2024-01-30 NOTE — Progress Notes (Signed)
Name: Cody Velez DOB: 1946-03-07 MRN: [id number]  History of Present Illness: Cody Velez is a 78 y.o. male who presents today for follow up visit at [facility]. He is accompanied by his girlfriend Cody Velez. - GU history: 1. Prostate cancer. - March 2016: TRUS/Bx. At that time, 2 cores at the left apex revealed relatively low volume GS 3+3 pattern. Prostatic volume 65 ml. PSA 4.1. PSAD 0.06. Oncotype DX was performed at that time, as we anticipated active surveillance. This revealed a GPS score 11, favorable/low risk prostate cancer. Less than 5% chance of having high-grade disease/extraprostatic disease.  - 04/14/2015: Started on Proscar (Finasteride). - February 2017: Repeat TRUS/Bx. Only one core at the left apex at that time revealed GS 3+3 , only 5% of core involved.  - 01/24/2022: MRI prostate. Volume 50 mL. No extra prostatic abnormalities noted. Prostate was normal with PI-RADS category 1 appearance. 2. Neurogenic bowel & bladder.  - Thought to be secondary to spinal cord injury and multiple head injuries with concussion related to his [employer]. Additional risk factors include nicotine use (chew tobacco x30 years) and prior stroke. 3. Prior recurrent UTIs. 4. Kidney stone(s). - 01/27/2016: Underwent right ureteroscopic stone manipulation by Dr. Retta Diones. - 03/05/2022: RUS showed a 1.0 cm left kidney stone; no hydronephrosis bilaterally (last known relevant imaging study). 5. Erectile dysfunction.  PSA values: - 10/02/2020: 1.7 - 11/19/2021: 1.61 - 12/01/2021: 1.3 - 11/30/2022: 1.4  At last visit with Dr. Retta Diones on 11/30/2022: - "Most recent PSA last year 1.3.  He is still on finasteride and alfuzosin." - The plan was to continue current medications and annual follow up with PSA check for active surveillance.  Since last visit: > 04/01/2023: Per Urology consult note at the [facility] patient reported urinary urgency, nocturia x2-3, slow stream, sensations of  incomplete emptying, and incontinence.   > 12/09/2023: Normal renal function (creatinine 0.97, GFR 80).  > No PSA values found since last visit here per chart review.   Today: He reports that he is currently taking both Uroxatrol (Alfuzosin) 10 mg daily and Proscar (Finasteride) 5 mg daily.   He reports that he gets a sense of bladder fullness and is able to void volitionally into a handheld urinal. He wears Depends due to occasional urinary incontinence, particularly when he first wakes up; unclear if that is urge incontinence versus overflow incontinence. He states the leakage is not significantly bothersome. Reports terminal dribbling (sometimes larger volumes) despite doing double voiding technique routinely. Reports urinary urgency and frequency, which he attributes to his diuretic use. He denies dysuria, gross hematuria, hesitancy, straining to void, or sensations of incomplete emptying.  Denies any known stone episodes in the past 12 months.  Reports 1 possible UTI in the past 12 months - during hospitalization for acute encephalopathy in October 2024; however per chart review of that encounter his UA appeared negative and urine culture was not ordered.   He denies history of self-catheterization for urinary retention / incomplete bladder emptying. Per chart review an I&O cath was performed on 10/03/2024 during his hospitalization and returned 325 ml of urine; did not require indwelling Foley catheter at that time or at any other time in the past year per chart review & per patient report.    Fall Screening: Do you usually have a device to assist in your mobility? Yes   Medications: Current Outpatient Medications  Medication Sig Dispense Refill   acetaminophen (TYLENOL) 325 MG tablet Take 2 tablets (650  mg total) by mouth every 6 (six) hours as needed for mild pain (pain score 1-3) (or Fever >/= 101).     alfuzosin (UROXATRAL) 10 MG 24 hr tablet Take 10 mg by mouth daily with  breakfast.     ARIPiprazole (ABILIFY) 5 MG tablet Take 5 mg by mouth daily.     ascorbic acid (VITAMIN C) 500 MG tablet Take 500 mg by mouth 3 (three) times a week.     Cholecalciferol (VITAMIN D) 50 MCG (2000 UT) CAPS Take 2,000 Units by mouth daily.     clopidogrel (PLAVIX) 75 MG tablet Take 1 tablet (75 mg total) by mouth daily. 30 tablet 2   cyanocobalamin (VITAMIN B12) 500 MCG tablet Take 500 mcg by mouth daily.     diclofenac Sodium (VOLTAREN) 1 % GEL Apply topically 4 (four) times daily.     empagliflozin (JARDIANCE) 10 MG TABS tablet Take 1 tablet (10 mg total) by mouth daily before breakfast. 30 tablet 11   ferrous gluconate (FERGON) 240 (27 FE) MG tablet Take 240 mg by mouth 3 (three) times a week.     finasteride (PROSCAR) 5 MG tablet Take 1 tablet (5 mg total) by mouth daily. Reported on 05/04/2016 30 tablet 0   fluticasone (FLONASE) 50 MCG/ACT nasal spray Place 1 spray into both nostrils daily as needed for allergies. 16 g 2   furosemide (LASIX) 20 MG tablet Take 1 tablet (20 mg total) by mouth daily as needed for edema. 30 tablet 11   gabapentin (NEURONTIN) 100 MG capsule Take 100 mg by mouth at bedtime.     linaclotide (LINZESS) 290 MCG CAPS capsule Take 290 mcg by mouth as needed.     metoprolol succinate (TOPROL-XL) 50 MG 24 hr tablet Take 1 tablet by mouth daily.     mupirocin ointment (BACTROBAN) 2 % Apply 1 Application topically daily.     Oxycodone HCl 10 MG TABS Take 1 tablet (10 mg total) by mouth every 6 (six) hours as needed (pain). 15 tablet 0   pantoprazole (PROTONIX) 40 MG tablet Take 1 tablet by mouth daily.     rosuvastatin (CRESTOR) 20 MG tablet Take 20 mg by mouth daily.     sacubitril-valsartan (ENTRESTO) 49-51 MG Take 1 tablet by mouth 2 (two) times daily. 180 tablet 3   spironolactone (ALDACTONE) 25 MG tablet Take 25 mg by mouth daily.     traZODone (DESYREL) 100 MG tablet Take 0.5 tablets (50 mg total) by mouth at bedtime. 30 tablet 0   venlafaxine XR  (EFFEXOR-XR) 150 MG 24 hr capsule Take 1 capsule (150 mg total) by mouth 2 (two) times daily. 60 capsule 0   No current facility-administered medications for this visit.    Allergies: Allergies  Allergen Reactions   Lisinopril Cough    Pt doesn't remember reaction    Past Medical History:  Diagnosis Date   Allergic rhinitis    Arthritis    B12 deficiency    Cervicogenic headache    Childhood asthma    Chronic back pain    Coronary atherosclerosis of native coronary artery    Mild nonobstructive CAD at catheterization January 2015   Dementia [facility])    Depression    Essential hypertension    Falls    GERD (gastroesophageal reflux disease)    Glaucoma    History of blood transfusion    History of cerebrovascular disease 07/23/2015   History of kidney stones    History of pneumonia 02/2011  Hyperlipidemia    OSA (obstructive sleep apnea)    CPAP - Dr. Shelle Iron - uses cpap every night   Pneumonia    Prostate cancer [facility])    PTSD (post-traumatic stress disorder)    [place]   Rectal bleeding    Stroke (HCC) 03/2021   Wheelchair bound    Past Surgical History:  Procedure Laterality Date   APPLICATION OF ROBOTIC ASSISTANCE FOR SPINAL PROCEDURE N/A 03/28/2018   Procedure: APPLICATION OF ROBOTIC ASSISTANCE FOR SPINAL PROCEDURE;  Surgeon: Barnett Abu, MD;  Location: [facility] OR;  Service: Neurosurgery;  Laterality: N/A;   BACK SURGERY  02/14/2012   lumbar OR #7; "today redid L1L2; replaced screws; added bone from hip"   BILATERAL KNEE ARTHROSCOPY     COLONOSCOPY  10/15/2008   Dr. Jena Gauss: tubular adenoma    COLONOSCOPY  12/17/2003   ZOX:WRUEAV rectal and colon   COLONOSCOPY N/A 09/05/2014   tubular adenoma   COLONOSCOPY WITH PROPOFOL N/A 09/15/2022   Multiple 3-25 mm polyps in ascending colon and cecum, markedly elongated and redundant colon. One polyp removed piecemeal fashion. 6 month surveillance recommended. Tubular adenomas.   COLONOSCOPY WITH PROPOFOL N/A 02/23/2023    Procedure: COLONOSCOPY WITH PROPOFOL;  Surgeon: Corbin Ade, MD;  [facility];  Service: Endoscopy;  Laterality: N/A;  730am, asa 3   CYSTOSCOPY WITH STENT PLACEMENT Right 01/27/2016   Procedure: CYSTOSCOPY WITH STENT PLACEMENT;  Surgeon: Marcine Matar, MD;  Location: [facility];  Service: Urology;  Laterality: Right;   CYSTOSCOPY/RETROGRADE/URETEROSCOPY/STONE EXTRACTION WITH BASKET Right 01/27/2016   Procedure: CYSTOSCOPY, RIGHT RETROGRADE, RIGHT URETEROSCOPY, STONE EXTRACTION ;  Surgeon: Marcine Matar, MD;  Location: [facility];  Service: Urology;  Laterality: Right;   ESOPHAGOGASTRODUODENOSCOPY  10/15/2008   Dr Rourk:Schatzki's ring status post dilation and disruption via 70 F Maloney dilator/ otherwise unremarkable esophagus, small hiatal hernia, multiple fundal gland polyps not manipulated, gastritis, negative H.pylori   ESOPHAGOGASTRODUODENOSCOPY  06/21/2002   WUJ:WJXBJ sliding hiatal hernia with mild changes of reflux esophagitis limited to gastroesophageal junction.  Noncritical ring at distal esophagus, 3 cm proximal to gastroesophageal junction/Antral gastritis   FACIAL COSMETIC SURGERY  ` 1985   "broke face playing softball"   FLEXIBLE SIGMOIDOSCOPY N/A 07/28/2022   Procedure: FLEXIBLE SIGMOIDOSCOPY;  Surgeon: Corbin Ade, MD;  [facility];  Service: Endoscopy;  Laterality: N/A;   FRACTURE SURGERY     "left wrist; broke it; took spur off"   HEMOSTASIS CLIP PLACEMENT  02/23/2023   Procedure: HEMOSTASIS CLIP PLACEMENT;  Surgeon: Corbin Ade, MD;  [facility];  Service: Endoscopy;;   HOLMIUM LASER APPLICATION Right 01/27/2016   Procedure: HOLMIUM LASER APPLICATION;  Surgeon: Marcine Matar, MD;  Location: [facility];  Service: Urology;  Laterality: Right;   KNEE ARTHROSCOPY Right 05/18/2018   Procedure: PARTIAL MEDIAL MENISECTOMY AND SURGICAL LAVAGE AND CHONDROPLASTY;  Surgeon: Teryl Lucy, MD;  Location: [facility];  Service: Orthopedics;   Laterality: Right;   LEFT HEART CATHETERIZATION WITH CORONARY ANGIOGRAM N/A 12/27/2013   Procedure: LEFT HEART CATHETERIZATION WITH CORONARY ANGIOGRAM;  Surgeon: Peter M Swaziland, MD;  Location: [facility] CATH LAB;  Service: Cardiovascular;  Laterality: N/A;   neck epidural     POLYPECTOMY  09/15/2022   Procedure: POLYPECTOMY;  Surgeon: Corbin Ade, MD;  [facility];  Service: Endoscopy;;   POLYPECTOMY  02/23/2023   Procedure: POLYPECTOMY INTESTINAL;  Surgeon: Corbin Ade, MD;  [facility];  Service: Endoscopy;;   POSTERIOR LUMBAR FUSION  4 LEVEL N/A 03/28/2018   Procedure: Thoracic eight -Lumbar two- FIXATION WITH SCREW PLACEMENT, DECOMPRESSION Thoracic ten-Thoracic eleven  FOR OSTEOMYELITIS;  Surgeon: Barnett Abu, MD;  [facility]: [facility];  Service: Neurosurgery;  Laterality: N/A;   SCLEROTHERAPY  09/15/2022   Procedure: SCLEROTHERAPY;  Surgeon: Corbin Ade, MD;  [facility];  Service: Endoscopy;;   SHOULDER SURGERY Bilateral    SPINAL CORD STIMULATOR REMOVAL N/A 02/25/2020   Procedure: LUMBAR SPINAL CORD STIMULATOR REMOVAL;  Surgeon: Barnett Abu, MD;  Location: [facility] OR;  Service: Neurosurgery;  Laterality: N/A;   TEE WITHOUT CARDIOVERSION N/A 03/21/2018   Procedure: TRANSESOPHAGEAL ECHOCARDIOGRAM (TEE) WITH PROPOFOL;  Surgeon: Jonelle Sidle, MD;  [facility];  Service: Cardiovascular;  Laterality: N/A;   Family History  Problem Relation Age of Onset   Emphysema Father    Heart failure Father    Lung cancer Father    CAD Father    Colon cancer Mother    Stroke Mother    Breast cancer Mother    Stroke Sister    Heart attack Brother    Dementia Paternal Uncle    Emphysema Maternal Grandmother    Stroke Maternal Grandmother    Asthma Other        grandson   Heart disease Paternal Grandfather    Anesthesia problems Neg Hx    Hypotension Neg Hx    Malignant hyperthermia Neg Hx    Pseudochol deficiency Neg Hx    Social History    Socioeconomic History   Marital status: Widowed    Spouse name: Meriam Sprague   Number of children: 3   Years of education: Not on file   Highest education level: Not on file  Occupational History   Occupation: Research scientist (life sciences)    Employer: [employer]  Tobacco Use   Smoking status: Former    Current packs/day: 0.00    Types: Cigarettes    Quit date: 12/20/1958    Years since quitting: 65.1   Smokeless tobacco: Current    Types: Chew  Vaping Use   Vaping status: Never Used  Substance and Sexual Activity   Alcohol use: Not Currently    Alcohol/week: 0.0 standard drinks of alcohol    Comment: couple bottles of wine per month   Drug use: No    Comment: "last used marijuana ~ 1969"   Sexual activity: Not on file  Other Topics Concern   Not on file  Social History Narrative   Married, 3 daughters; retired   Patient drinks about 1 cup of coffee daily.   Patient is left handed.   Social Drivers of Corporate investment banker Strain: Low Risk  (07/14/2023)   Received from [facility]   Overall Financial Resource Strain (CARDIA)    Difficulty of Paying Living Expenses: Not very hard  Food Insecurity: No Food Insecurity (10/05/2023)   Hunger Vital Sign    Worried About Running Out of Food in the Last Year: Never true    Ran Out of Food in the Last Year: Never true  Transportation Needs: No Transportation Needs (10/05/2023)   PRAPARE - Administrator, Civil Service (Medical): No    Lack of Transportation (Non-Medical): No  Physical Activity: Not on file  Stress: Not on file  Social Connections: Unknown (05/03/2022)   [facility]   Social Network    Social Network: Not on file  Intimate Partner Violence: Not At Risk (10/05/2023)   Humiliation, Afraid,  Rape, and Kick questionnaire    Fear of Current or Ex-Partner: No    Emotionally Abused: No    Physically Abused: No    Sexually Abused: No    Review of Systems Constitutional: Patient denies any  unintentional weight loss or change in strength lntegumentary: Patient denies any rashes or pruritus Cardiovascular: Patient denies chest pain or syncope Respiratory: Patient denies shortness of breath Gastrointestinal: Patient reports fecal incontinence due to his neurogenic bowel  Musculoskeletal: Patient denies muscle cramps or weakness Neurologic: Patient denies convulsions or seizures Allergic/Immunologic: Patient denies recent allergic reaction(s) Hematologic/Lymphatic: Patient denies bleeding tendencies Endocrine: Patient denies heat/cold intolerance  GU: As per HPI.  OBJECTIVE Vitals:   01/31/24 1439  BP: 138/75  Pulse: (!) 42   There is no height or weight on file to calculate BMI.  Physical Examination Constitutional: No obvious distress; patient is non-toxic appearing  Cardiovascular: No visible lower extremity edema.  Respiratory: The patient does not have audible wheezing/stridor; respirations do not appear labored  Gastrointestinal: Abdomen non-distended Musculoskeletal: Normal ROM of UEs  Skin: No obvious rashes/open sores  Neurologic: CN 2-12 grossly intact Psychiatric: Answered questions appropriately with normal affect  Hematologic/Lymphatic/Immunologic: No obvious bruises or sites of spontaneous bleeding  UA: Patient did not void in office today - reported urinating prior to arrival  PVR: 0 ml  ASSESSMENT BPH with urinary obstruction - Plan: BLADDER SCAN AMB NON-IMAGING, PSA, CANCELED: Urinalysis, Routine w reflex microscopic  Malignant neoplasm of prostate (HCC) - Plan: BLADDER SCAN AMB NON-IMAGING, PSA, CANCELED: Urinalysis, Routine w reflex microscopic  Kidney stones - Plan: US RENAL, DG Abd 1 View  Elevated platelet count - Plan: Ambulatory referral to Hematology / Oncology  Family history of polycythemia vera - Plan: Ambulatory referral to Hematology / Oncology  OSA treated with BiPAP  Dry mouth  Ambulatory dysfunction  Wheelchair  dependent  1. Prostate cancer.  - Continues on active surveillance. We agreed to recheck PSA today.   2. Prostatomegaly with LUTS.  - Advised to continue Uroxatrol (Alfuzosin) 10 mg daily and Proscar (Finasteride) 5 mg daily; he will continue to receive refills through his provider at the [place].  - Discussed limited evening fluid intake, however that remains a challenge for him due to chronic dry mouth secondary to BiPAP use for OSA.  2. Neurogenic bladder.  - No evidence of urinary retention / incomplete bladder emptying.  - We agreed to obtain RUS to rule out hydronephrosis since last imaging study was almost 2 year ago. Low suspicion for VUR / hydronephrosis given that his renal function has remained normal. - For urgency/frequency: Exacerbated by diuretic use and glucosuria from [place] use. Advised to continue double voiding. May consider trial of a beta-3 adrenergic agonist medication (Myrbetriq or Gemtesa) in the future if desired.  3. Prior recurrent UTIs. Resolved.   4. Kidney stones. Asymptomatic today. We agreed to obtain RUS & KUB to recheck stone burden.   5. History of elevated platelet count with family history of polycythemia vera (mother). Hematology referral placed today per patient request.   We agreed to plan for follow up in 2 weeks to review PSA & imaging results. Patient verbalized understanding of and agreement with current plan. All questions were answered.  PLAN Advised the following: 1. PSA. 2. RUS & KUB. 3. Continue Uroxatrol (Alfuzosin) 10 mg daily. 4. Continue Proscar (Finasteride) 5 mg daily.  5. Continue double voiding. 6. Return in about 2 weeks (around 02/14/2024) for UA, PVR, & f/u with Maralyn Sago  Alohilani Levenhagen NP.  Orders Placed This Encounter  Procedures   US RENAL    Standing Status:   Future    Expected Date:   01/31/2024    Expiration Date:   01/30/2025    Reason for Exam (SYMPTOM  OR DIAGNOSIS REQUIRED):   kidney stone known or suspected    Preferred  imaging location?:   [facility]   DG Abd 1 View    Standing Status:   Future    Expected Date:   01/31/2024    Expiration Date:   01/30/2025    Reason for Exam (SYMPTOM  OR DIAGNOSIS REQUIRED):   kidney stone    Preferred imaging location?:   [facility]   PSA   Ambulatory referral to Hematology / Oncology    Referral Priority:   Routine    Referral Type:   Consultation    Referral Reason:   Specialty Services Required    Requested Specialty:   Oncology    Number of Visits Requested:   1   BLADDER SCAN AMB NON-IMAGING   Total time spent caring for the patient today was over 40 minutes. This includes time spent on the date of the visit reviewing the patient's chart before the visit, time spent during the visit, and time spent after the visit on documentation. Over 50% of that time was spent in face-to-face time with this patient for direct counseling. E&M based on time and complexity of medical decision making.  It has been explained that the patient is to follow regularly with their PCP in addition to all other providers involved in their care and to follow instructions provided by these respective offices. Patient advised to contact urology clinic if any urologic-pertaining questions, concerns, new symptoms or problems arise in the interim period.  There are no Patient Instructions on file for this visit.  Electronically signed by:  Donnita Falls, FNP   01/31/24    3:48 PM

## 2024-01-31 ENCOUNTER — Encounter: Payer: Self-pay | Admitting: Urology

## 2024-01-31 ENCOUNTER — Ambulatory Visit: Payer: No Typology Code available for payment source | Admitting: Urology

## 2024-01-31 ENCOUNTER — Ambulatory Visit: Payer: Medicare Other | Admitting: Urology

## 2024-01-31 VITALS — BP 138/75 | HR 42

## 2024-01-31 DIAGNOSIS — N319 Neuromuscular dysfunction of bladder, unspecified: Secondary | ICD-10-CM | POA: Insufficient documentation

## 2024-01-31 DIAGNOSIS — R4181 Age-related cognitive decline: Secondary | ICD-10-CM | POA: Insufficient documentation

## 2024-01-31 DIAGNOSIS — Z6841 Body Mass Index (BMI) 40.0 and over, adult: Secondary | ICD-10-CM | POA: Insufficient documentation

## 2024-01-31 DIAGNOSIS — Z87442 Personal history of urinary calculi: Secondary | ICD-10-CM

## 2024-01-31 DIAGNOSIS — R7989 Other specified abnormal findings of blood chemistry: Secondary | ICD-10-CM | POA: Insufficient documentation

## 2024-01-31 DIAGNOSIS — N138 Other obstructive and reflux uropathy: Secondary | ICD-10-CM

## 2024-01-31 DIAGNOSIS — Z993 Dependence on wheelchair: Secondary | ICD-10-CM | POA: Insufficient documentation

## 2024-01-31 DIAGNOSIS — N200 Calculus of kidney: Secondary | ICD-10-CM

## 2024-01-31 DIAGNOSIS — N401 Enlarged prostate with lower urinary tract symptoms: Secondary | ICD-10-CM

## 2024-01-31 DIAGNOSIS — C61 Malignant neoplasm of prostate: Secondary | ICD-10-CM | POA: Diagnosis not present

## 2024-01-31 DIAGNOSIS — G4731 Primary central sleep apnea: Secondary | ICD-10-CM | POA: Insufficient documentation

## 2024-01-31 DIAGNOSIS — D75839 Thrombocytosis, unspecified: Secondary | ICD-10-CM | POA: Insufficient documentation

## 2024-01-31 DIAGNOSIS — Z832 Family history of diseases of the blood and blood-forming organs and certain disorders involving the immune mechanism: Secondary | ICD-10-CM | POA: Insufficient documentation

## 2024-01-31 DIAGNOSIS — R682 Dry mouth, unspecified: Secondary | ICD-10-CM | POA: Insufficient documentation

## 2024-01-31 DIAGNOSIS — G4733 Obstructive sleep apnea (adult) (pediatric): Secondary | ICD-10-CM

## 2024-01-31 DIAGNOSIS — K5900 Constipation, unspecified: Secondary | ICD-10-CM | POA: Insufficient documentation

## 2024-01-31 DIAGNOSIS — R262 Difficulty in walking, not elsewhere classified: Secondary | ICD-10-CM | POA: Insufficient documentation

## 2024-01-31 DIAGNOSIS — M1990 Unspecified osteoarthritis, unspecified site: Secondary | ICD-10-CM | POA: Insufficient documentation

## 2024-01-31 DIAGNOSIS — M51379 Other intervertebral disc degeneration, lumbosacral region without mention of lumbar back pain or lower extremity pain: Secondary | ICD-10-CM | POA: Insufficient documentation

## 2024-01-31 DIAGNOSIS — Z7409 Other reduced mobility: Secondary | ICD-10-CM | POA: Insufficient documentation

## 2024-01-31 DIAGNOSIS — Z7729 Contact with and (suspected ) exposure to other hazardous substances: Secondary | ICD-10-CM | POA: Insufficient documentation

## 2024-01-31 LAB — BLADDER SCAN AMB NON-IMAGING: Scan Result: 0

## 2024-02-01 DIAGNOSIS — S62102D Fracture of unspecified carpal bone, left wrist, subsequent encounter for fracture with routine healing: Secondary | ICD-10-CM | POA: Diagnosis not present

## 2024-02-01 DIAGNOSIS — I110 Hypertensive heart disease with heart failure: Secondary | ICD-10-CM | POA: Diagnosis not present

## 2024-02-01 DIAGNOSIS — S12100D Unspecified displaced fracture of second cervical vertebra, subsequent encounter for fracture with routine healing: Secondary | ICD-10-CM | POA: Diagnosis not present

## 2024-02-01 DIAGNOSIS — T25321D Burn of third degree of right foot, subsequent encounter: Secondary | ICD-10-CM | POA: Diagnosis not present

## 2024-02-01 DIAGNOSIS — I5022 Chronic systolic (congestive) heart failure: Secondary | ICD-10-CM | POA: Diagnosis not present

## 2024-02-01 DIAGNOSIS — T24311D Burn of third degree of right thigh, subsequent encounter: Secondary | ICD-10-CM | POA: Diagnosis not present

## 2024-02-01 LAB — PSA: Prostate Specific Ag, Serum: 1.1 ng/mL (ref 0.0–4.0)

## 2024-02-02 ENCOUNTER — Telehealth: Payer: Self-pay

## 2024-02-02 NOTE — Telephone Encounter (Signed)
Patient was made aware and voiced understanding.

## 2024-02-02 NOTE — Telephone Encounter (Signed)
-----   Message from Donnita Falls sent at 02/01/2024  8:17 AM EST ----- Please let pt know his PSA is normal. Follow up as planned. Thanks.

## 2024-02-03 DIAGNOSIS — I5022 Chronic systolic (congestive) heart failure: Secondary | ICD-10-CM | POA: Diagnosis not present

## 2024-02-03 DIAGNOSIS — I110 Hypertensive heart disease with heart failure: Secondary | ICD-10-CM | POA: Diagnosis not present

## 2024-02-03 DIAGNOSIS — S12100D Unspecified displaced fracture of second cervical vertebra, subsequent encounter for fracture with routine healing: Secondary | ICD-10-CM | POA: Diagnosis not present

## 2024-02-03 DIAGNOSIS — T24311D Burn of third degree of right thigh, subsequent encounter: Secondary | ICD-10-CM | POA: Diagnosis not present

## 2024-02-03 DIAGNOSIS — S62102D Fracture of unspecified carpal bone, left wrist, subsequent encounter for fracture with routine healing: Secondary | ICD-10-CM | POA: Diagnosis not present

## 2024-02-03 DIAGNOSIS — T25321D Burn of third degree of right foot, subsequent encounter: Secondary | ICD-10-CM | POA: Diagnosis not present

## 2024-02-06 DIAGNOSIS — T24311A Burn of third degree of right thigh, initial encounter: Secondary | ICD-10-CM | POA: Diagnosis not present

## 2024-02-06 DIAGNOSIS — T25311A Burn of third degree of right ankle, initial encounter: Secondary | ICD-10-CM | POA: Diagnosis not present

## 2024-02-06 DIAGNOSIS — I5020 Unspecified systolic (congestive) heart failure: Secondary | ICD-10-CM | POA: Diagnosis not present

## 2024-02-09 ENCOUNTER — Ambulatory Visit (HOSPITAL_COMMUNITY): Payer: No Typology Code available for payment source

## 2024-02-10 DIAGNOSIS — S12100D Unspecified displaced fracture of second cervical vertebra, subsequent encounter for fracture with routine healing: Secondary | ICD-10-CM | POA: Diagnosis not present

## 2024-02-10 DIAGNOSIS — I5022 Chronic systolic (congestive) heart failure: Secondary | ICD-10-CM | POA: Diagnosis not present

## 2024-02-10 DIAGNOSIS — S62102D Fracture of unspecified carpal bone, left wrist, subsequent encounter for fracture with routine healing: Secondary | ICD-10-CM | POA: Diagnosis not present

## 2024-02-10 DIAGNOSIS — I110 Hypertensive heart disease with heart failure: Secondary | ICD-10-CM | POA: Diagnosis not present

## 2024-02-10 DIAGNOSIS — T25321D Burn of third degree of right foot, subsequent encounter: Secondary | ICD-10-CM | POA: Diagnosis not present

## 2024-02-10 DIAGNOSIS — T24311D Burn of third degree of right thigh, subsequent encounter: Secondary | ICD-10-CM | POA: Diagnosis not present

## 2024-02-13 ENCOUNTER — Ambulatory Visit (HOSPITAL_COMMUNITY): Payer: No Typology Code available for payment source

## 2024-02-15 ENCOUNTER — Ambulatory Visit (HOSPITAL_COMMUNITY)
Admission: RE | Admit: 2024-02-15 | Discharge: 2024-02-15 | Disposition: A | Discharge status: Home / Self Care | Payer: No Typology Code available for payment source | Source: Ambulatory Visit | Attending: Urology | Admitting: Urology

## 2024-02-15 DIAGNOSIS — C61 Malignant neoplasm of prostate: Secondary | ICD-10-CM | POA: Diagnosis not present

## 2024-02-15 DIAGNOSIS — G8220 Paraplegia, unspecified: Secondary | ICD-10-CM | POA: Diagnosis not present

## 2024-02-15 DIAGNOSIS — E669 Obesity, unspecified: Secondary | ICD-10-CM | POA: Diagnosis not present

## 2024-02-15 DIAGNOSIS — J45909 Unspecified asthma, uncomplicated: Secondary | ICD-10-CM | POA: Diagnosis not present

## 2024-02-15 DIAGNOSIS — E440 Moderate protein-calorie malnutrition: Secondary | ICD-10-CM | POA: Diagnosis not present

## 2024-02-15 DIAGNOSIS — S12100D Unspecified displaced fracture of second cervical vertebra, subsequent encounter for fracture with routine healing: Secondary | ICD-10-CM | POA: Diagnosis not present

## 2024-02-15 DIAGNOSIS — G4733 Obstructive sleep apnea (adult) (pediatric): Secondary | ICD-10-CM | POA: Diagnosis not present

## 2024-02-15 DIAGNOSIS — N200 Calculus of kidney: Secondary | ICD-10-CM | POA: Insufficient documentation

## 2024-02-15 DIAGNOSIS — K219 Gastro-esophageal reflux disease without esophagitis: Secondary | ICD-10-CM | POA: Diagnosis not present

## 2024-02-15 DIAGNOSIS — S62102D Fracture of unspecified carpal bone, left wrist, subsequent encounter for fracture with routine healing: Secondary | ICD-10-CM | POA: Diagnosis not present

## 2024-02-15 DIAGNOSIS — E785 Hyperlipidemia, unspecified: Secondary | ICD-10-CM | POA: Diagnosis not present

## 2024-02-15 DIAGNOSIS — I739 Peripheral vascular disease, unspecified: Secondary | ICD-10-CM | POA: Diagnosis not present

## 2024-02-15 DIAGNOSIS — M1990 Unspecified osteoarthritis, unspecified site: Secondary | ICD-10-CM | POA: Diagnosis not present

## 2024-02-15 DIAGNOSIS — G4700 Insomnia, unspecified: Secondary | ICD-10-CM | POA: Diagnosis not present

## 2024-02-15 DIAGNOSIS — F02A3 Dementia in other diseases classified elsewhere, mild, with mood disturbance: Secondary | ICD-10-CM | POA: Diagnosis not present

## 2024-02-15 DIAGNOSIS — D630 Anemia in neoplastic disease: Secondary | ICD-10-CM | POA: Diagnosis not present

## 2024-02-15 DIAGNOSIS — F4310 Post-traumatic stress disorder, unspecified: Secondary | ICD-10-CM | POA: Diagnosis not present

## 2024-02-15 DIAGNOSIS — F32A Depression, unspecified: Secondary | ICD-10-CM | POA: Diagnosis not present

## 2024-02-15 DIAGNOSIS — I110 Hypertensive heart disease with heart failure: Secondary | ICD-10-CM | POA: Diagnosis not present

## 2024-02-15 DIAGNOSIS — E538 Deficiency of other specified B group vitamins: Secondary | ICD-10-CM | POA: Diagnosis not present

## 2024-02-15 DIAGNOSIS — I5022 Chronic systolic (congestive) heart failure: Secondary | ICD-10-CM | POA: Diagnosis not present

## 2024-02-15 DIAGNOSIS — I69351 Hemiplegia and hemiparesis following cerebral infarction affecting right dominant side: Secondary | ICD-10-CM | POA: Diagnosis not present

## 2024-02-15 DIAGNOSIS — I2510 Atherosclerotic heart disease of native coronary artery without angina pectoris: Secondary | ICD-10-CM | POA: Diagnosis not present

## 2024-02-15 DIAGNOSIS — T25321D Burn of third degree of right foot, subsequent encounter: Secondary | ICD-10-CM | POA: Diagnosis not present

## 2024-02-15 DIAGNOSIS — F02A4 Dementia in other diseases classified elsewhere, mild, with anxiety: Secondary | ICD-10-CM | POA: Diagnosis not present

## 2024-02-15 DIAGNOSIS — T24311D Burn of third degree of right thigh, subsequent encounter: Secondary | ICD-10-CM | POA: Diagnosis not present

## 2024-02-20 DIAGNOSIS — T25321D Burn of third degree of right foot, subsequent encounter: Secondary | ICD-10-CM | POA: Diagnosis not present

## 2024-02-20 DIAGNOSIS — S62102D Fracture of unspecified carpal bone, left wrist, subsequent encounter for fracture with routine healing: Secondary | ICD-10-CM | POA: Diagnosis not present

## 2024-02-20 DIAGNOSIS — I110 Hypertensive heart disease with heart failure: Secondary | ICD-10-CM | POA: Diagnosis not present

## 2024-02-20 DIAGNOSIS — T24311D Burn of third degree of right thigh, subsequent encounter: Secondary | ICD-10-CM | POA: Diagnosis not present

## 2024-02-20 DIAGNOSIS — S12100D Unspecified displaced fracture of second cervical vertebra, subsequent encounter for fracture with routine healing: Secondary | ICD-10-CM | POA: Diagnosis not present

## 2024-02-20 DIAGNOSIS — I5022 Chronic systolic (congestive) heart failure: Secondary | ICD-10-CM | POA: Diagnosis not present
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
## 2024-02-21 ENCOUNTER — Inpatient Hospital Stay: Payer: Medicare Other | Attending: Hematology | Admitting: Hematology

## 2024-02-21 ENCOUNTER — Inpatient Hospital Stay: Payer: Medicare Other

## 2024-02-21 VITALS — BP 107/71 | HR 73 | Temp 98.5°F | Resp 18 | Ht 75.0 in | Wt 270.0 lb

## 2024-02-21 DIAGNOSIS — D75839 Thrombocytosis, unspecified: Secondary | ICD-10-CM | POA: Diagnosis not present

## 2024-02-21 DIAGNOSIS — Z801 Family history of malignant neoplasm of trachea, bronchus and lung: Secondary | ICD-10-CM | POA: Diagnosis not present

## 2024-02-21 DIAGNOSIS — Z86718 Personal history of other venous thrombosis and embolism: Secondary | ICD-10-CM | POA: Insufficient documentation

## 2024-02-21 DIAGNOSIS — D471 Chronic myeloproliferative disease: Secondary | ICD-10-CM | POA: Insufficient documentation

## 2024-02-21 DIAGNOSIS — Z8673 Personal history of transient ischemic attack (TIA), and cerebral infarction without residual deficits: Secondary | ICD-10-CM | POA: Diagnosis not present

## 2024-02-21 DIAGNOSIS — E785 Hyperlipidemia, unspecified: Secondary | ICD-10-CM | POA: Diagnosis not present

## 2024-02-21 DIAGNOSIS — Z7902 Long term (current) use of antithrombotics/antiplatelets: Secondary | ICD-10-CM | POA: Insufficient documentation

## 2024-02-21 DIAGNOSIS — F0390 Unspecified dementia without behavioral disturbance: Secondary | ICD-10-CM | POA: Diagnosis not present

## 2024-02-21 DIAGNOSIS — Z7901 Long term (current) use of anticoagulants: Secondary | ICD-10-CM | POA: Insufficient documentation

## 2024-02-21 DIAGNOSIS — Z791 Long term (current) use of non-steroidal anti-inflammatories (NSAID): Secondary | ICD-10-CM | POA: Insufficient documentation

## 2024-02-21 DIAGNOSIS — Z79899 Other long term (current) drug therapy: Secondary | ICD-10-CM | POA: Diagnosis not present

## 2024-02-21 DIAGNOSIS — K219 Gastro-esophageal reflux disease without esophagitis: Secondary | ICD-10-CM | POA: Diagnosis not present

## 2024-02-21 DIAGNOSIS — Z8546 Personal history of malignant neoplasm of prostate: Secondary | ICD-10-CM | POA: Diagnosis not present

## 2024-02-21 DIAGNOSIS — Z8042 Family history of malignant neoplasm of prostate: Secondary | ICD-10-CM | POA: Diagnosis not present

## 2024-02-21 DIAGNOSIS — R634 Abnormal weight loss: Secondary | ICD-10-CM | POA: Insufficient documentation

## 2024-02-21 DIAGNOSIS — E538 Deficiency of other specified B group vitamins: Secondary | ICD-10-CM | POA: Diagnosis not present

## 2024-02-21 DIAGNOSIS — Z87442 Personal history of urinary calculi: Secondary | ICD-10-CM | POA: Insufficient documentation

## 2024-02-21 DIAGNOSIS — Z800 Family history of malignant neoplasm of digestive organs: Secondary | ICD-10-CM | POA: Diagnosis not present

## 2024-02-21 DIAGNOSIS — I10 Essential (primary) hypertension: Secondary | ICD-10-CM | POA: Insufficient documentation

## 2024-02-21 DIAGNOSIS — G4730 Sleep apnea, unspecified: Secondary | ICD-10-CM | POA: Diagnosis not present

## 2024-02-21 DIAGNOSIS — Z7984 Long term (current) use of oral hypoglycemic drugs: Secondary | ICD-10-CM | POA: Diagnosis not present

## 2024-02-21 DIAGNOSIS — Z87891 Personal history of nicotine dependence: Secondary | ICD-10-CM | POA: Diagnosis not present

## 2024-02-21 DIAGNOSIS — I2510 Atherosclerotic heart disease of native coronary artery without angina pectoris: Secondary | ICD-10-CM | POA: Diagnosis not present

## 2024-02-21 LAB — FERRITIN: Ferritin: 9 ng/mL — ABNORMAL LOW (ref 24–336)

## 2024-02-21 LAB — CBC WITH DIFFERENTIAL/PLATELET
Abs Immature Granulocytes: 0.05 10*3/uL (ref 0.00–0.07)
Basophils Absolute: 0.1 10*3/uL (ref 0.0–0.1)
Basophils Relative: 2 %
Eosinophils Absolute: 0.6 10*3/uL — ABNORMAL HIGH (ref 0.0–0.5)
Eosinophils Relative: 6 %
HCT: 55.8 % — ABNORMAL HIGH (ref 39.0–52.0)
Hemoglobin: 15.9 g/dL (ref 13.0–17.0)
Immature Granulocytes: 1 %
Lymphocytes Relative: 11 %
Lymphs Abs: 1 10*3/uL (ref 0.7–4.0)
MCH: 24.2 pg — ABNORMAL LOW (ref 26.0–34.0)
MCHC: 28.5 g/dL — ABNORMAL LOW (ref 30.0–36.0)
MCV: 85.1 fL (ref 80.0–100.0)
Monocytes Absolute: 0.6 10*3/uL (ref 0.1–1.0)
Monocytes Relative: 7 %
Neutro Abs: 7.1 10*3/uL (ref 1.7–7.7)
Neutrophils Relative %: 73 %
Platelets: 699 10*3/uL — ABNORMAL HIGH (ref 150–400)
RBC: 6.56 MIL/uL — ABNORMAL HIGH (ref 4.22–5.81)
RDW: 19.3 % — ABNORMAL HIGH (ref 11.5–15.5)
WBC: 9.4 10*3/uL (ref 4.0–10.5)
nRBC: 0 % (ref 0.0–0.2)

## 2024-02-21 LAB — IRON AND TIBC
Iron: 38 ug/dL — ABNORMAL LOW (ref 45–182)
Saturation Ratios: 10 % — ABNORMAL LOW (ref 17.9–39.5)
TIBC: 377 ug/dL (ref 250–450)
UIBC: 339 ug/dL

## 2024-02-21 LAB — SEDIMENTATION RATE: Sed Rate: 1 mm/h (ref 0–16)

## 2024-02-21 LAB — C-REACTIVE PROTEIN: CRP: 0.5 mg/dL (ref <1.0)

## 2024-02-21 NOTE — Patient Instructions (Signed)
 You were seen and examined today by Dr. Ellin Saba. Dr. Ellin Saba is a hematologist, meaning that he specializes in blood abnormalities. Dr. Ellin Saba discussed your past medical history, family history of cancers/blood conditions and the events that led to you being here today.  You were referred to Dr. Ellin Saba due to thrombocytosis (elevated platelet count).  Dr. Ellin Saba has recommended additional labs today for further evaluation.  Follow-up as scheduled.

## 2024-02-22 ENCOUNTER — Ambulatory Visit: Payer: Medicare Other | Admitting: Urology

## 2024-02-22 ENCOUNTER — Encounter: Payer: Self-pay | Admitting: Urology

## 2024-02-22 VITALS — BP 117/73 | HR 83

## 2024-02-22 DIAGNOSIS — N319 Neuromuscular dysfunction of bladder, unspecified: Secondary | ICD-10-CM | POA: Diagnosis not present

## 2024-02-22 DIAGNOSIS — N138 Other obstructive and reflux uropathy: Secondary | ICD-10-CM | POA: Diagnosis not present

## 2024-02-22 DIAGNOSIS — N401 Enlarged prostate with lower urinary tract symptoms: Secondary | ICD-10-CM | POA: Diagnosis not present

## 2024-02-22 DIAGNOSIS — C61 Malignant neoplasm of prostate: Secondary | ICD-10-CM | POA: Diagnosis not present

## 2024-02-22 DIAGNOSIS — Z87442 Personal history of urinary calculi: Secondary | ICD-10-CM

## 2024-02-22 DIAGNOSIS — R350 Frequency of micturition: Secondary | ICD-10-CM

## 2024-02-22 DIAGNOSIS — N2889 Other specified disorders of kidney and ureter: Secondary | ICD-10-CM

## 2024-02-22 DIAGNOSIS — K08139 Complete loss of teeth due to caries, unspecified class: Secondary | ICD-10-CM | POA: Insufficient documentation

## 2024-02-22 DIAGNOSIS — N200 Calculus of kidney: Secondary | ICD-10-CM

## 2024-02-22 LAB — BLADDER SCAN AMB NON-IMAGING: Scan Result: 226

## 2024-02-22 LAB — RHEUMATOID FACTOR: Rheumatoid fact SerPl-aCnc: <10 [IU]/mL (ref <14.0)

## 2024-02-22 NOTE — Progress Notes (Unsigned)
 Name: Cody Velez DOB: 03/02/1946 MRN: [id number]  History of Present Illness: Cody Velez is a 78 y.o. male who presents today for follow up visit at [facility] [place]. He is accompanied by his girlfriend Cody Velez. - GU history: 1. Prostate cancer. - March 2016: TRUS/Bx. At that time, 2 cores at the left apex revealed relatively low volume GS 3+3 pattern. Prostatic volume 65 ml. PSA 4.1. PSAD 0.06. Oncotype DX was performed at that time, as we anticipated active surveillance. This revealed a GPS score 11, favorable/low risk prostate cancer. Less than 5% chance of having high-grade disease/extraprostatic disease.  - 04/14/2015: Started on Proscar (Finasteride). - February 2017: Repeat TRUS/Bx. Only one core at the left apex at that time revealed GS 3+3 , only 5% of core involved.  - 01/24/2022: MRI prostate. Volume 50 mL. No extra prostatic abnormalities noted. Prostate was normal with PI-RADS category 1 appearance. 2. Neurogenic bowel & bladder.  - Thought to be secondary to spinal cord injury and multiple head injuries with concussion related to his Cody Velez. Additional risk factors include nicotine use (chew tobacco x30 years) and prior stroke. 3. Prior recurrent UTIs. 4. Kidney stone(s). - 01/27/2016: Underwent right ureteroscopic stone manipulation by Dr. Retta Diones. - 03/05/2022: RUS showed a 1.0 cm left kidney stone; no hydronephrosis bilaterally (last known relevant imaging study). 5. Erectile dysfunction.  At last visit on 01/31/2024 the plan was: 1. Prostate cancer.  - PSA check; on active surveillance.  2. Prostatomegaly.  - Continue Uroxatrol 10 mg daily and Proscar 5 mg daily.  - Discussed limited evening fluid intake, difficult due to chronic dry mouth secondary to BiPAP use for OSA. 3. Neurogenic bladder. - Able to sense of bladder fullness.  - Voids volitionally into a handheld urinal.  - PVR = 0 ml.  - Does double voiding technique routinely.  4. LUTS  (frequency, urgency, occasional urge vs overflow incontinence, terminal dribbling). - Exacerbated by diuretic use and glucosuria from [place] use.  - Advised to continue double voiding.  5. Prior recurrent UTIs. Resolved.  6. Kidney stones.  - Asymptomatic.  - RUS & KUB to recheck stone burden and rule out hydronephrosis. 7. History of elevated platelet count with family history of polycythemia vera (mother).  - Referred to Hematology per patient request.    Since last visit: > 01/31/2024: PSA normal (1.1).  > RUS on 02/15/2024 showed no evidence of stones or hydronephrosis. "There is a 12 mm echogenic mass midpole left kidney. This may represent an angiomyolipoma. Recommend follow-up renal ultrasound in 6 months to assess for stability."  Today: He reports doing well today. Denies any acute urinary symptoms / concerns.   PSA values: - 10/02/2020: 1.7 - 11/19/2021: 1.61 - 12/01/2021: 1.3 - 11/30/2022: 1.4 - 01/31/2024: 1.1   Medications: Current Outpatient Medications  Medication Sig Dispense Refill   acetaminophen (TYLENOL) 325 MG tablet Take 2 tablets (650 mg total) by mouth every 6 (six) hours as needed for mild pain (pain score 1-3) (or Fever >/= 101).     alfuzosin (UROXATRAL) 10 MG 24 hr tablet Take 10 mg by mouth daily with breakfast.     ARIPiprazole (ABILIFY) 5 MG tablet Take 5 mg by mouth daily.     ascorbic acid (VITAMIN C) 500 MG tablet Take 500 mg by mouth 3 (three) times a week.     Cholecalciferol (VITAMIN D) 50 MCG (2000 UT) CAPS Take 2,000 Units by mouth daily.     clopidogrel (PLAVIX) 75 MG  tablet Take 1 tablet (75 mg total) by mouth daily. 30 tablet 2   cyanocobalamin (VITAMIN B12) 500 MCG tablet Take 500 mcg by mouth daily.     diclofenac Sodium (VOLTAREN) 1 % GEL Apply topically 4 (four) times daily.     empagliflozin (JARDIANCE) 10 MG TABS tablet Take 1 tablet (10 mg total) by mouth daily before breakfast. 30 tablet 11   ferrous gluconate (FERGON) 240 (27 FE)  MG tablet Take 240 mg by mouth 3 (three) times a week.     finasteride (PROSCAR) 5 MG tablet Take 1 tablet (5 mg total) by mouth daily. Reported on 05/04/2016 30 tablet 0   fluticasone (FLONASE) 50 MCG/ACT nasal spray Place 1 spray into both nostrils daily as needed for allergies. 16 g 2   furosemide (LASIX) 20 MG tablet Take 1 tablet (20 mg total) by mouth daily as needed for edema. 30 tablet 11   gabapentin (NEURONTIN) 100 MG capsule Take 100 mg by mouth at bedtime.     linaclotide (LINZESS) 290 MCG CAPS capsule Take 290 mcg by mouth as needed.     metoprolol succinate (TOPROL-XL) 50 MG 24 hr tablet Take 1 tablet by mouth daily.     mupirocin ointment (BACTROBAN) 2 % Apply 1 Application topically daily.     Oxycodone HCl 10 MG TABS Take 1 tablet (10 mg total) by mouth every 6 (six) hours as needed (pain). 15 tablet 0   pantoprazole (PROTONIX) 40 MG tablet Take 1 tablet by mouth daily.     rosuvastatin (CRESTOR) 20 MG tablet Take 20 mg by mouth daily.     sacubitril-valsartan (ENTRESTO) 49-51 MG Take 1 tablet by mouth 2 (two) times daily. 180 tablet 3   spironolactone (ALDACTONE) 25 MG tablet Take 25 mg by mouth daily.     traZODone (DESYREL) 50 MG tablet Take 50 mg by mouth at bedtime.     venlafaxine XR (EFFEXOR-XR) 150 MG 24 hr capsule Take 1 capsule (150 mg total) by mouth 2 (two) times daily. 60 capsule 0   No current facility-administered medications for this visit.    Allergies: Allergies  Allergen Reactions   Lisinopril Cough    Pt doesn't remember reaction    Past Medical History:  Diagnosis Date   Allergic rhinitis    Arthritis    B12 deficiency    Cervicogenic headache    Childhood asthma    Chronic back pain    Coronary atherosclerosis of native coronary artery    Mild nonobstructive CAD at catheterization January 2015   [facility])    Depression    Essential hypertension    Falls    GERD (gastroesophageal reflux disease)    Glaucoma    History of blood  transfusion    History of cerebrovascular disease 07/23/2015   History of kidney stones    History of pneumonia 02/2011   Hyperlipidemia    OSA (obstructive sleep apnea)    CPAP - Dr. Shelle Iron - uses cpap every night   Pneumonia    Prostate cancer [facility])    PTSD (post-traumatic stress disorder)    [place]   Rectal bleeding    Stroke (HCC) 03/2021   Wheelchair bound    Past Surgical History:  Procedure Laterality Date   APPLICATION OF ROBOTIC ASSISTANCE FOR SPINAL PROCEDURE N/A 03/28/2018   Procedure: APPLICATION OF ROBOTIC ASSISTANCE FOR SPINAL PROCEDURE;  Surgeon: Barnett Abu, MD;  [facility] OR;  Service: Neurosurgery;  Laterality: N/A;   BACK SURGERY  02/14/2012   lumbar OR #7; "today redid L1L2; replaced screws; added bone from hip"   BILATERAL KNEE ARTHROSCOPY     COLONOSCOPY  10/15/2008   Dr. Jena Gauss: tubular adenoma    COLONOSCOPY  12/17/2003   WUJ:[id number] rectal and colon   COLONOSCOPY N/A 09/05/2014   tubular adenoma   COLONOSCOPY WITH PROPOFOL N/A 09/15/2022   Multiple 3-25 mm polyps in ascending colon and cecum, markedly elongated and redundant colon. One polyp removed piecemeal fashion. 6 month surveillance recommended. Tubular adenomas.   COLONOSCOPY WITH PROPOFOL N/A 02/23/2023   Procedure: COLONOSCOPY WITH PROPOFOL;  Surgeon: Corbin Ade, MD;  [facility];  Service: Endoscopy;  Laterality: N/A;  730am, asa 3   CYSTOSCOPY WITH STENT PLACEMENT Right 01/27/2016   Procedure: CYSTOSCOPY WITH STENT PLACEMENT;  Surgeon: Marcine Matar, MD;  Location: [facility] ORS;  Service: Urology;  Laterality: Right;   CYSTOSCOPY/RETROGRADE/URETEROSCOPY/STONE EXTRACTION WITH BASKET Right 01/27/2016   Procedure: CYSTOSCOPY, RIGHT RETROGRADE, RIGHT URETEROSCOPY, STONE EXTRACTION ;  Surgeon: Marcine Matar, MD;  Location: [facility] ORS;  Service: Urology;  Laterality: Right;   ESOPHAGOGASTRODUODENOSCOPY  10/15/2008   Dr Rourk:Schatzki's ring status post dilation and disruption via 18 F  Maloney dilator/ otherwise unremarkable esophagus, small hiatal hernia, multiple fundal gland polyps not manipulated, gastritis, negative H.pylori   ESOPHAGOGASTRODUODENOSCOPY  06/21/2002   NWG:NFAOZ sliding hiatal hernia with mild changes of reflux esophagitis limited to gastroesophageal junction.  Noncritical ring at distal esophagus, 3 cm proximal to gastroesophageal junction/Antral gastritis   FACIAL COSMETIC SURGERY  ` 1985   "broke face playing softball"   FLEXIBLE SIGMOIDOSCOPY N/A 07/28/2022   Procedure: FLEXIBLE SIGMOIDOSCOPY;  Surgeon: Corbin Ade, MD;  [facility];  Service: Endoscopy;  Laterality: N/A;   FRACTURE SURGERY     "left wrist; broke it; took spur off"   HEMOSTASIS CLIP PLACEMENT  02/23/2023   Procedure: HEMOSTASIS CLIP PLACEMENT;  Surgeon: Corbin Ade, MD;  [facility];  Service: Endoscopy;;   HOLMIUM LASER APPLICATION Right 01/27/2016   Procedure: HOLMIUM LASER APPLICATION;  Surgeon: Marcine Matar, MD;  Location: [facility];  Service: Urology;  Laterality: Right;   KNEE ARTHROSCOPY Right 05/18/2018   Procedure: PARTIAL MEDIAL MENISECTOMY AND SURGICAL LAVAGE AND CHONDROPLASTY;  Surgeon: Teryl Lucy, MD;  Location: [facility] OR;  Service: Orthopedics;  Laterality: Right;   LEFT HEART CATHETERIZATION WITH CORONARY ANGIOGRAM N/A 12/27/2013   Procedure: LEFT HEART CATHETERIZATION WITH CORONARY ANGIOGRAM;  Surgeon: Peter M Swaz[place]and, MD;  Location[facility] C[facility] LAB;  Service: Cardiovascular;  Laterality: N/A;   neck epidural     POLYPECTOMY  09/15/2022   Procedure: POLYPECTOMY;  Surgeon: Corbin Ade, MD;  [facility];  Service: Endoscopy;;   POLYPECTOMY  02/23/2023   Procedure: POLYPECTOMY INTESTINAL;  Surgeon: Corbin Ade, MD;  [facility];  Service: Endoscopy;;   POSTERIOR LUMBAR FUSION 4 LEVEL N/A 03/28/2018   Procedure: Thoracic eight -Lumbar two- FIXATION WITH SCREW PLACEMENT, DECOMPRESSION Thoracic ten-Thoracic eleven  FOR  OSTEOMYELITIS;  Surgeon: Barnett Abu, MD;  [facility];  Service: Neurosurgery;  Laterality: N/A;   SCLEROTHERAPY  09/15/2022   Procedure: SCLEROTHERAPY;  Surgeon: Corbin Ade, MD;  [facility];  Service: Endoscopy;;   SHOULDER SURGERY Bilateral    SPINAL CORD STIMULATOR REMOVAL N/A 02/25/2020   Procedure: LUMBAR SPINAL CORD STIMULATOR REMOVAL;  Surgeon: Barnett Abu, MD;  Location: [facility] OR;  Service: Neurosurgery;  Laterality: N/A;   TEE WITHOUT CARDIOVERSION N/A 03/21/2018  Procedure: TRANSESOPHAGEAL ECHOCARDIOGRAM (TEE) WITH PROPOFOL;  Surgeon: Jonelle Sidle, MD;  [facility];  Service: Cardiovascular;  Laterality: N/A;   Family History  Problem Relation Age of Onset   Emphysema Father    Heart failure Father    Lung cancer Father    CAD Father    Colon cancer Mother    Stroke Mother    Breast cancer Mother    Stroke Sister    Heart attack Brother    Dementia Paternal Uncle    Emphysema Maternal Grandmother    Stroke Maternal Grandmother    Asthma Other        grandson   Heart disease Paternal Grandfather    Anesthesia problems Neg Hx    Hypotension Neg Hx    Malignant hyperthermia Neg Hx    Pseudochol deficiency Neg Hx    Social History   Socioeconomic History   Marital status: Widowed    Spouse name: Meriam Sprague   Number of children: 3   Years of education: Not on file   Highest education level: Not on file  Occupational History   Occupation: Research scientist (life sciences)    Employer: [employer]  Tobacco Use   Smoking status: Former    Current packs/day: 0.00    Types: Cigarettes    Quit date: 12/20/1958    Years since quitting: 65.2   Smokeless tobacco: Current    Types: Chew  Vaping Use   Vaping status: Never Used  Substance and Sexual Activity   Alcohol use: Not Currently    Alcohol/week: 0.0 standard drinks of alcohol    Comment: couple bottles of wine per month   Drug use: No    Comment: "last used marijuana ~ 1969"   Sexual activity: Not on file   Other Topics Concern   Not on file  Social History Narrative   Married, 3 daughters; retired   Patient drinks about 1 cup of coffee daily.   Patient is left handed.   Social Drivers of Corporate investment banker Strain: Low Risk  (07/14/2023)   [facility]   Overall Financial Resource Strain (CARDIA)    Difficulty of Paying Living Expenses: Not very hard  Food Insecurity: No Food Insecurity (02/21/2024)   Hunger Vital Sign    Worried About Running Out of Food in the Last Year: Never true    Ran Out of Food in the Last Year: Never true  Transportation Needs: No Transportation Needs (02/21/2024)   PRAPARE - Administrator, Civil Service (Medical): No    Lack of Transportation (Non-Medical): No  Physical Activity: Not on file  Stress: Not on file  Social Connections: Unknown (05/03/2022)   [facility]   Social Network    Social Network: Not on file  Intimate Partner Violence: Not At Risk (02/21/2024)   Humiliation, Afraid, Rape, and Kick questionnaire    Fear of Current or Ex-Partner: No    Emotionally Abused: No    Physically Abused: No    Sexually Abused: No    Review of Systems Constitutional: Patient denies any unintentional weight loss or change in strength lntegumentary: Patient denies any rashes or pruritus Cardiovascular: Patient denies chest pain or syncope Respiratory: Patient denies shortness of breath Musculoskeletal: Patient denies muscle cramps or weakness Neurologic: Patient denies convulsions or seizures Allergic/Immunologic: Patient denies recent allergic reaction(s) Hematologic/Lymphatic: Patient denies bleeding tendencies Endocrine: Patient denies heat/cold intolerance  GU: As per HPI.  OBJECTIVE There were  no vitals filed for this visit. There is no height or weight on file to calculate BMI.  Physical Examination Constitutional: No obvious distress; patient is non-toxic appearing   Cardiovascular: No visible lower extremity edema.  Respiratory: The patient does not have audible wheezing/stridor; respirations do not appear labored  Gastrointestinal: Abdomen non-distended Musculoskeletal: Normal ROM of UEs  Skin: No obvious rashes/open sores  Neurologic: CN 2-12 grossly intact Psychiatric: Answered questions appropriately with normal affect  Hematologic/Lymphatic/Immunologic: No obvious bruises or sites of spontaneous bleeding  ASSESSMENT BPH with urinary obstruction - Plan: Urinalysis, Routine w reflex microscopic, BLADDER SCAN AMB NON-IMAGING  Benign prostatic hyperplasia with urinary frequency  Kidney stones - Plan: US RENAL  Malignant neoplasm of prostate (HCC)  Neuromuscular dysfunction of bladder, unspecified  Left renal mass - Per RUS on 02/15/2024: "There is a 12 mm echogenic mass midpole left kidney. This may represent an angiomyolipoma." - Plan: US RENAL  1. Prostate cancer. He was reassured that his PSA remains stable. Will continue on active surveillance with repeat PSA in 1 year.  2. Prostatomegaly.  As previously discussed at last visit: - Advised to continue Uroxatrol (Alfuzosin) 10 mg daily and Proscar (Finasteride) 5 mg daily; he will continue to receive refills through his provider at the [place].  - Discussed limited evening fluid intake, however that remains a challenge for him due to chronic dry mouth secondary to BiPAP use for OSA.  3. Neurogenic bladder. - Has had no significant evidence of incomplete bladder emptying or hydronephrosis.   4. LUTS (frequency, urgency, occasional urge vs overflow incontinence, terminal dribbling). - Exacerbated by diuretic use and glucosuria from [place] use.  - Advised to continue double voiding.   6. Kidney stones. He was reassured that no stones were identified on recent renal US.   7. Left renal mass, indeterminate. We reviewed the results of his renal US with evidence of likely benign left renal  angiomyolipoma.   Will plan for follow up with repeat RUS in 6 months. Patient verbalized understanding of and agreement with current plan. All questions were answered.  PLAN Advised the following: 1. Continue Uroxatrol (Alfuzosin) 10 mg daily. 2. Continue Proscar (Finasteride) 5 mg daily.  3. Continue double voiding. 4. Return in about 6 months (around 08/24/2024) for RUS, UA, PVR, & f/u with Evette Georges NP. 5. Repeat PSA in 1 year.   Orders Placed This Encounter  Procedures   US RENAL    Standing Status:   Future    Expected Date:   08/24/2024    Expiration Date:   02/21/2025    Reason for Exam (SYMPTOM  OR DIAGNOSIS REQUIRED):   kidney stone known or suspected    Preferred imaging location?:   [facility]   Urinalysis, Routine w reflex microscopic   BLADDER SCAN AMB NON-IMAGING   Total time spent caring for the patient today was over 30 minutes. This includes time spent on the date of the visit reviewing the patient's chart before the visit, time spent during the visit, and time spent after the visit on documentation. Over 50% of that time was spent in face-to-face time with this patient for direct counseling. E&M based on time and complexity of medical decision making.  It has been explained that the patient is to follow regularly with their PCP in addition to all other providers involved in their care and to follow instructions provided by these respective offices. Patient advised to contact urology clinic if any urologic-pertaining questions, concerns, new symptoms  or problems arise in the interim period.  There are no Patient Instructions on file for this visit.  Electronically signed by:  Donnita Falls, FNP   02/22/24    2:12 PM

## 2024-02-24 DIAGNOSIS — I5022 Chronic systolic (congestive) heart failure: Secondary | ICD-10-CM | POA: Diagnosis not present

## 2024-02-24 DIAGNOSIS — S12100D Unspecified displaced fracture of second cervical vertebra, subsequent encounter for fracture with routine healing: Secondary | ICD-10-CM | POA: Diagnosis not present

## 2024-02-24 DIAGNOSIS — S62102D Fracture of unspecified carpal bone, left wrist, subsequent encounter for fracture with routine healing: Secondary | ICD-10-CM | POA: Diagnosis not present

## 2024-02-24 DIAGNOSIS — I110 Hypertensive heart disease with heart failure: Secondary | ICD-10-CM | POA: Diagnosis not present

## 2024-02-24 DIAGNOSIS — T25321D Burn of third degree of right foot, subsequent encounter: Secondary | ICD-10-CM | POA: Diagnosis not present

## 2024-02-24 DIAGNOSIS — T24311D Burn of third degree of right thigh, subsequent encounter: Secondary | ICD-10-CM | POA: Diagnosis not present

## 2024-02-28 DIAGNOSIS — I5022 Chronic systolic (congestive) heart failure: Secondary | ICD-10-CM | POA: Diagnosis not present

## 2024-02-28 DIAGNOSIS — S12100D Unspecified displaced fracture of second cervical vertebra, subsequent encounter for fracture with routine healing: Secondary | ICD-10-CM | POA: Diagnosis not present

## 2024-02-28 DIAGNOSIS — T25321D Burn of third degree of right foot, subsequent encounter: Secondary | ICD-10-CM | POA: Diagnosis not present

## 2024-02-28 DIAGNOSIS — I110 Hypertensive heart disease with heart failure: Secondary | ICD-10-CM | POA: Diagnosis not present

## 2024-02-28 DIAGNOSIS — S62102D Fracture of unspecified carpal bone, left wrist, subsequent encounter for fracture with routine healing: Secondary | ICD-10-CM | POA: Diagnosis not present

## 2024-02-28 DIAGNOSIS — T24311D Burn of third degree of right thigh, subsequent encounter: Secondary | ICD-10-CM | POA: Diagnosis not present

## 2024-02-28 LAB — JAK2 V617F RFX CALR/MPL/E12-15: JAK2 V617F %: 58.4 %

## 2024-02-28 LAB — ANTINUCLEAR ANTIBODIES, IFA: ANA Ab, IFA: NEGATIVE

## 2024-03-01 DIAGNOSIS — T24311D Burn of third degree of right thigh, subsequent encounter: Secondary | ICD-10-CM | POA: Diagnosis not present

## 2024-03-01 DIAGNOSIS — I110 Hypertensive heart disease with heart failure: Secondary | ICD-10-CM | POA: Diagnosis not present

## 2024-03-01 DIAGNOSIS — S12100D Unspecified displaced fracture of second cervical vertebra, subsequent encounter for fracture with routine healing: Secondary | ICD-10-CM | POA: Diagnosis not present

## 2024-03-01 DIAGNOSIS — T25321D Burn of third degree of right foot, subsequent encounter: Secondary | ICD-10-CM | POA: Diagnosis not present

## 2024-03-01 DIAGNOSIS — I5022 Chronic systolic (congestive) heart failure: Secondary | ICD-10-CM | POA: Diagnosis not present

## 2024-03-01 DIAGNOSIS — S62102D Fracture of unspecified carpal bone, left wrist, subsequent encounter for fracture with routine healing: Secondary | ICD-10-CM | POA: Diagnosis not present

## 2024-03-05 DIAGNOSIS — T310 Burns involving less than 10% of body surface: Secondary | ICD-10-CM | POA: Diagnosis not present

## 2024-03-05 DIAGNOSIS — L97519 Non-pressure chronic ulcer of other part of right foot with unspecified severity: Secondary | ICD-10-CM | POA: Diagnosis not present

## 2024-03-05 DIAGNOSIS — T24311D Burn of third degree of right thigh, subsequent encounter: Secondary | ICD-10-CM | POA: Diagnosis not present

## 2024-03-05 DIAGNOSIS — T25321D Burn of third degree of right foot, subsequent encounter: Secondary | ICD-10-CM | POA: Diagnosis not present

## 2024-03-05 DIAGNOSIS — T25311D Burn of third degree of right ankle, subsequent encounter: Secondary | ICD-10-CM | POA: Diagnosis not present

## 2024-03-07 DIAGNOSIS — I5022 Chronic systolic (congestive) heart failure: Secondary | ICD-10-CM | POA: Diagnosis not present

## 2024-03-07 DIAGNOSIS — T25321D Burn of third degree of right foot, subsequent encounter: Secondary | ICD-10-CM | POA: Diagnosis not present

## 2024-03-07 DIAGNOSIS — S12100D Unspecified displaced fracture of second cervical vertebra, subsequent encounter for fracture with routine healing: Secondary | ICD-10-CM | POA: Diagnosis not present

## 2024-03-07 DIAGNOSIS — T24311D Burn of third degree of right thigh, subsequent encounter: Secondary | ICD-10-CM | POA: Diagnosis not present

## 2024-03-07 DIAGNOSIS — S62102D Fracture of unspecified carpal bone, left wrist, subsequent encounter for fracture with routine healing: Secondary | ICD-10-CM | POA: Diagnosis not present

## 2024-03-07 DIAGNOSIS — I110 Hypertensive heart disease with heart failure: Secondary | ICD-10-CM | POA: Diagnosis not present

## 2024-03-08 ENCOUNTER — Ambulatory Visit: Payer: Medicare Other | Admitting: Neurology

## 2024-03-08 DIAGNOSIS — T24311D Burn of third degree of right thigh, subsequent encounter: Secondary | ICD-10-CM | POA: Diagnosis not present

## 2024-03-08 DIAGNOSIS — I110 Hypertensive heart disease with heart failure: Secondary | ICD-10-CM | POA: Diagnosis not present

## 2024-03-08 DIAGNOSIS — S62102D Fracture of unspecified carpal bone, left wrist, subsequent encounter for fracture with routine healing: Secondary | ICD-10-CM | POA: Diagnosis not present

## 2024-03-08 DIAGNOSIS — I5022 Chronic systolic (congestive) heart failure: Secondary | ICD-10-CM | POA: Diagnosis not present

## 2024-03-08 DIAGNOSIS — T25321D Burn of third degree of right foot, subsequent encounter: Secondary | ICD-10-CM | POA: Diagnosis not present

## 2024-03-08 DIAGNOSIS — S12100D Unspecified displaced fracture of second cervical vertebra, subsequent encounter for fracture with routine healing: Secondary | ICD-10-CM | POA: Diagnosis not present

## 2024-03-12 ENCOUNTER — Ambulatory Visit: Payer: Medicare Other | Attending: Cardiology

## 2024-03-12 DIAGNOSIS — I5022 Chronic systolic (congestive) heart failure: Secondary | ICD-10-CM | POA: Diagnosis not present

## 2024-03-13 ENCOUNTER — Inpatient Hospital Stay (HOSPITAL_BASED_OUTPATIENT_CLINIC_OR_DEPARTMENT_OTHER): Admitting: Hematology

## 2024-03-13 VITALS — BP 139/75 | HR 38 | Temp 96.6°F | Resp 18

## 2024-03-13 DIAGNOSIS — Z86718 Personal history of other venous thrombosis and embolism: Secondary | ICD-10-CM | POA: Diagnosis not present

## 2024-03-13 DIAGNOSIS — D471 Chronic myeloproliferative disease: Secondary | ICD-10-CM | POA: Diagnosis not present

## 2024-03-13 DIAGNOSIS — R634 Abnormal weight loss: Secondary | ICD-10-CM | POA: Diagnosis not present

## 2024-03-13 DIAGNOSIS — T24311D Burn of third degree of right thigh, subsequent encounter: Secondary | ICD-10-CM | POA: Diagnosis not present

## 2024-03-13 DIAGNOSIS — Z7901 Long term (current) use of anticoagulants: Secondary | ICD-10-CM | POA: Diagnosis not present

## 2024-03-13 DIAGNOSIS — T25321D Burn of third degree of right foot, subsequent encounter: Secondary | ICD-10-CM | POA: Diagnosis not present

## 2024-03-13 DIAGNOSIS — S12100D Unspecified displaced fracture of second cervical vertebra, subsequent encounter for fracture with routine healing: Secondary | ICD-10-CM | POA: Diagnosis not present

## 2024-03-13 DIAGNOSIS — S62102D Fracture of unspecified carpal bone, left wrist, subsequent encounter for fracture with routine healing: Secondary | ICD-10-CM | POA: Diagnosis not present

## 2024-03-13 DIAGNOSIS — Z8546 Personal history of malignant neoplasm of prostate: Secondary | ICD-10-CM | POA: Diagnosis not present

## 2024-03-13 DIAGNOSIS — D75839 Thrombocytosis, unspecified: Secondary | ICD-10-CM | POA: Diagnosis not present

## 2024-03-13 DIAGNOSIS — I110 Hypertensive heart disease with heart failure: Secondary | ICD-10-CM | POA: Diagnosis not present

## 2024-03-13 DIAGNOSIS — I5022 Chronic systolic (congestive) heart failure: Secondary | ICD-10-CM | POA: Diagnosis not present

## 2024-03-13 LAB — ECHOCARDIOGRAM COMPLETE
AR max vel: 1.79 cm2
AV Area VTI: 2.03 cm2
AV Area mean vel: 2 cm2
AV Mean grad: 7 mmHg
AV Peak grad: 15.2 mmHg
Ao pk vel: 1.95 m/s
Area-P 1/2: 2.2 cm2
Calc EF: 34.7 %
MV VTI: 2.79 cm2
S' Lateral: 4.3 cm
Single Plane A2C EF: 28.2 %
Single Plane A4C EF: 42.6 %

## 2024-03-13 MED ORDER — HYDROXYUREA 500 MG PO CAPS: 500.0000 mg | ORAL_CAPSULE | Freq: Every day | ORAL | 2 refills | Status: DC
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
## 2024-03-13 NOTE — Patient Instructions (Addendum)
 [facility] at [facility] Discharge Instructions   You were seen and examined today by Dr. Ellin Saba.  He reviewed the results of your lab work which are mostly normal/stable. Your iron was low. You also tested positive on a special blood test called JAK2. This causes your body's bone marrow to continuously produce platelets and other blood cells.   He discussed starting you on a pill called Hydrea to get the platelets under control. We would like to see the platelets under 400,000.   We will see you back in 4 weeks. We will repeat lab work at this visit.    Return as scheduled.    Thank you for choosing [facility] at [facility] to provide your oncology and hematology care.  To afford each patient quality time with our provider, please arrive at least 15 minutes before your scheduled appointment time.   If you have a lab appointment with the Cancer Center please come in thru the Main Entrance and check in at the main information desk.  You need to re-schedule your appointment should you arrive 10 or more minutes late.  We strive to give you quality time with our providers, and arriving late affects you and other patients whose appointments are after yours.  Also, if you no show three or more times for appointments you may be dismissed from the clinic at the providers discretion.     Again, thank you for choosing [facility].  Our hope is that these requests will decrease the amount of time that you wait before being seen by our physicians.       _____________________________________________________________  Should you have questions after your visit to [facility], please contact our office at [phone] and follow the prompts.  Our office hours are 8:00 a.m. and 4:30 p.m. Monday - Friday.  Please note that voicemails left after 4:00 p.m. may not be returned until the following business day.  We are closed weekends  and major holidays.  You do have access to a nurse 24-7, just call the main number to the clinic [phone] and do not press any options, hold on the line and a nurse will answer the phone.    For prescription refill requests, have your pharmacy contact our office and allow 72 hours.    Due to Covid, you will need to wear a mask upon entering the hospital. If you do not have a mask, a mask will be given to you at the Main Entrance upon arrival. For doctor visits, patients may have 1 support person age 37 or older with them. For treatment visits, patients can not have anyone with them due to social distancing guidelines and our immunocompromised population.

## 2024-03-16 DIAGNOSIS — D630 Anemia in neoplastic disease: Secondary | ICD-10-CM | POA: Diagnosis not present

## 2024-03-16 DIAGNOSIS — I110 Hypertensive heart disease with heart failure: Secondary | ICD-10-CM | POA: Diagnosis not present

## 2024-03-16 DIAGNOSIS — E785 Hyperlipidemia, unspecified: Secondary | ICD-10-CM | POA: Diagnosis not present

## 2024-03-16 DIAGNOSIS — F02A3 Dementia in other diseases classified elsewhere, mild, with mood disturbance: Secondary | ICD-10-CM | POA: Diagnosis not present

## 2024-03-16 DIAGNOSIS — G4733 Obstructive sleep apnea (adult) (pediatric): Secondary | ICD-10-CM | POA: Diagnosis not present

## 2024-03-16 DIAGNOSIS — F02A4 Dementia in other diseases classified elsewhere, mild, with anxiety: Secondary | ICD-10-CM | POA: Diagnosis not present

## 2024-03-16 DIAGNOSIS — I2510 Atherosclerotic heart disease of native coronary artery without angina pectoris: Secondary | ICD-10-CM | POA: Diagnosis not present

## 2024-03-16 DIAGNOSIS — J45909 Unspecified asthma, uncomplicated: Secondary | ICD-10-CM | POA: Diagnosis not present

## 2024-03-16 DIAGNOSIS — Z6833 Body mass index (BMI) 33.0-33.9, adult: Secondary | ICD-10-CM | POA: Diagnosis not present

## 2024-03-16 DIAGNOSIS — I69351 Hemiplegia and hemiparesis following cerebral infarction affecting right dominant side: Secondary | ICD-10-CM | POA: Diagnosis not present

## 2024-03-16 DIAGNOSIS — S12100D Unspecified displaced fracture of second cervical vertebra, subsequent encounter for fracture with routine healing: Secondary | ICD-10-CM | POA: Diagnosis not present

## 2024-03-16 DIAGNOSIS — M1990 Unspecified osteoarthritis, unspecified site: Secondary | ICD-10-CM | POA: Diagnosis not present

## 2024-03-16 DIAGNOSIS — C61 Malignant neoplasm of prostate: Secondary | ICD-10-CM | POA: Diagnosis not present

## 2024-03-16 DIAGNOSIS — K219 Gastro-esophageal reflux disease without esophagitis: Secondary | ICD-10-CM | POA: Diagnosis not present

## 2024-03-16 DIAGNOSIS — F32A Depression, unspecified: Secondary | ICD-10-CM | POA: Diagnosis not present

## 2024-03-16 DIAGNOSIS — F4310 Post-traumatic stress disorder, unspecified: Secondary | ICD-10-CM | POA: Diagnosis not present

## 2024-03-16 DIAGNOSIS — I5022 Chronic systolic (congestive) heart failure: Secondary | ICD-10-CM | POA: Diagnosis not present

## 2024-03-16 DIAGNOSIS — E440 Moderate protein-calorie malnutrition: Secondary | ICD-10-CM | POA: Diagnosis not present

## 2024-03-16 DIAGNOSIS — E538 Deficiency of other specified B group vitamins: Secondary | ICD-10-CM | POA: Diagnosis not present

## 2024-03-16 DIAGNOSIS — G8220 Paraplegia, unspecified: Secondary | ICD-10-CM | POA: Diagnosis not present

## 2024-03-16 DIAGNOSIS — S62102D Fracture of unspecified carpal bone, left wrist, subsequent encounter for fracture with routine healing: Secondary | ICD-10-CM | POA: Diagnosis not present

## 2024-03-16 DIAGNOSIS — G4700 Insomnia, unspecified: Secondary | ICD-10-CM | POA: Diagnosis not present

## 2024-03-16 DIAGNOSIS — E669 Obesity, unspecified: Secondary | ICD-10-CM | POA: Diagnosis not present

## 2024-03-16 DIAGNOSIS — I739 Peripheral vascular disease, unspecified: Secondary | ICD-10-CM | POA: Diagnosis not present

## 2024-03-16 DIAGNOSIS — T25321D Burn of third degree of right foot, subsequent encounter: Secondary | ICD-10-CM | POA: Diagnosis not present

## 2024-03-19 ENCOUNTER — Ambulatory Visit: Payer: Medicare Other | Admitting: Cardiology

## 2024-03-23 DIAGNOSIS — S62102D Fracture of unspecified carpal bone, left wrist, subsequent encounter for fracture with routine healing: Secondary | ICD-10-CM | POA: Diagnosis not present

## 2024-03-23 DIAGNOSIS — T25321D Burn of third degree of right foot, subsequent encounter: Secondary | ICD-10-CM | POA: Diagnosis not present

## 2024-03-23 DIAGNOSIS — I2510 Atherosclerotic heart disease of native coronary artery without angina pectoris: Secondary | ICD-10-CM | POA: Diagnosis not present

## 2024-03-23 DIAGNOSIS — I110 Hypertensive heart disease with heart failure: Secondary | ICD-10-CM | POA: Diagnosis not present

## 2024-03-23 DIAGNOSIS — S12100D Unspecified displaced fracture of second cervical vertebra, subsequent encounter for fracture with routine healing: Secondary | ICD-10-CM | POA: Diagnosis not present

## 2024-03-23 DIAGNOSIS — I5022 Chronic systolic (congestive) heart failure: Secondary | ICD-10-CM | POA: Diagnosis not present

## 2024-03-28 DIAGNOSIS — I110 Hypertensive heart disease with heart failure: Secondary | ICD-10-CM | POA: Diagnosis not present

## 2024-03-28 DIAGNOSIS — I2510 Atherosclerotic heart disease of native coronary artery without angina pectoris: Secondary | ICD-10-CM | POA: Diagnosis not present

## 2024-03-28 DIAGNOSIS — T25321D Burn of third degree of right foot, subsequent encounter: Secondary | ICD-10-CM | POA: Diagnosis not present

## 2024-03-28 DIAGNOSIS — S12100D Unspecified displaced fracture of second cervical vertebra, subsequent encounter for fracture with routine healing: Secondary | ICD-10-CM | POA: Diagnosis not present

## 2024-03-28 DIAGNOSIS — I5022 Chronic systolic (congestive) heart failure: Secondary | ICD-10-CM | POA: Diagnosis not present

## 2024-03-28 DIAGNOSIS — S62102D Fracture of unspecified carpal bone, left wrist, subsequent encounter for fracture with routine healing: Secondary | ICD-10-CM | POA: Diagnosis not present

## 2024-03-29 ENCOUNTER — Ambulatory Visit: Admitting: Neurology

## 2024-04-02 DIAGNOSIS — I5022 Chronic systolic (congestive) heart failure: Secondary | ICD-10-CM | POA: Diagnosis not present

## 2024-04-02 DIAGNOSIS — Z8673 Personal history of transient ischemic attack (TIA), and cerebral infarction without residual deficits: Secondary | ICD-10-CM | POA: Diagnosis not present

## 2024-04-02 DIAGNOSIS — Z993 Dependence on wheelchair: Secondary | ICD-10-CM | POA: Diagnosis not present

## 2024-04-02 DIAGNOSIS — T24311D Burn of third degree of right thigh, subsequent encounter: Secondary | ICD-10-CM | POA: Diagnosis not present

## 2024-04-02 DIAGNOSIS — M869 Osteomyelitis, unspecified: Secondary | ICD-10-CM | POA: Diagnosis not present

## 2024-04-02 DIAGNOSIS — T25321D Burn of third degree of right foot, subsequent encounter: Secondary | ICD-10-CM | POA: Diagnosis not present

## 2024-04-02 DIAGNOSIS — L97512 Non-pressure chronic ulcer of other part of right foot with fat layer exposed: Secondary | ICD-10-CM | POA: Diagnosis not present

## 2024-04-02 DIAGNOSIS — T25311D Burn of third degree of right ankle, subsequent encounter: Secondary | ICD-10-CM | POA: Diagnosis not present

## 2024-04-02 DIAGNOSIS — M8580 Other specified disorders of bone density and structure, unspecified site: Secondary | ICD-10-CM | POA: Diagnosis not present

## 2024-04-04 ENCOUNTER — Ambulatory Visit: Payer: Medicare Other | Admitting: Gastroenterology

## 2024-04-05 ENCOUNTER — Encounter: Payer: Self-pay | Admitting: Gastroenterology

## 2024-04-06 DIAGNOSIS — S62102D Fracture of unspecified carpal bone, left wrist, subsequent encounter for fracture with routine healing: Secondary | ICD-10-CM | POA: Diagnosis not present

## 2024-04-06 DIAGNOSIS — I2510 Atherosclerotic heart disease of native coronary artery without angina pectoris: Secondary | ICD-10-CM | POA: Diagnosis not present

## 2024-04-06 DIAGNOSIS — T25321D Burn of third degree of right foot, subsequent encounter: Secondary | ICD-10-CM | POA: Diagnosis not present

## 2024-04-06 DIAGNOSIS — I5022 Chronic systolic (congestive) heart failure: Secondary | ICD-10-CM | POA: Diagnosis not present

## 2024-04-06 DIAGNOSIS — I110 Hypertensive heart disease with heart failure: Secondary | ICD-10-CM | POA: Diagnosis not present

## 2024-04-06 DIAGNOSIS — S12100D Unspecified displaced fracture of second cervical vertebra, subsequent encounter for fracture with routine healing: Secondary | ICD-10-CM | POA: Diagnosis not present

## 2024-04-10 ENCOUNTER — Other Ambulatory Visit: Payer: Self-pay

## 2024-04-10 DIAGNOSIS — D75839 Thrombocytosis, unspecified: Secondary | ICD-10-CM

## 2024-04-10 DIAGNOSIS — D471 Chronic myeloproliferative disease: Secondary | ICD-10-CM
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
## 2024-04-11 ENCOUNTER — Inpatient Hospital Stay

## 2024-04-11 ENCOUNTER — Inpatient Hospital Stay: Attending: Hematology | Admitting: Hematology

## 2024-04-11 VITALS — BP 133/86 | HR 62 | Temp 98.6°F | Resp 19

## 2024-04-11 DIAGNOSIS — Z8042 Family history of malignant neoplasm of prostate: Secondary | ICD-10-CM | POA: Insufficient documentation

## 2024-04-11 DIAGNOSIS — Z8546 Personal history of malignant neoplasm of prostate: Secondary | ICD-10-CM | POA: Diagnosis not present

## 2024-04-11 DIAGNOSIS — Z79899 Other long term (current) drug therapy: Secondary | ICD-10-CM | POA: Insufficient documentation

## 2024-04-11 DIAGNOSIS — Z8781 Personal history of (healed) traumatic fracture: Secondary | ICD-10-CM | POA: Diagnosis not present

## 2024-04-11 DIAGNOSIS — D471 Chronic myeloproliferative disease: Secondary | ICD-10-CM

## 2024-04-11 DIAGNOSIS — Z87891 Personal history of nicotine dependence: Secondary | ICD-10-CM | POA: Diagnosis not present

## 2024-04-11 DIAGNOSIS — F4310 Post-traumatic stress disorder, unspecified: Secondary | ICD-10-CM | POA: Insufficient documentation

## 2024-04-11 DIAGNOSIS — I2510 Atherosclerotic heart disease of native coronary artery without angina pectoris: Secondary | ICD-10-CM | POA: Diagnosis not present

## 2024-04-11 DIAGNOSIS — Z7901 Long term (current) use of anticoagulants: Secondary | ICD-10-CM | POA: Insufficient documentation

## 2024-04-11 DIAGNOSIS — E538 Deficiency of other specified B group vitamins: Secondary | ICD-10-CM | POA: Insufficient documentation

## 2024-04-11 DIAGNOSIS — R630 Anorexia: Secondary | ICD-10-CM | POA: Diagnosis not present

## 2024-04-11 DIAGNOSIS — Z8673 Personal history of transient ischemic attack (TIA), and cerebral infarction without residual deficits: Secondary | ICD-10-CM | POA: Diagnosis not present

## 2024-04-11 DIAGNOSIS — R634 Abnormal weight loss: Secondary | ICD-10-CM | POA: Insufficient documentation

## 2024-04-11 DIAGNOSIS — G4730 Sleep apnea, unspecified: Secondary | ICD-10-CM | POA: Diagnosis not present

## 2024-04-11 DIAGNOSIS — D75839 Thrombocytosis, unspecified: Secondary | ICD-10-CM

## 2024-04-11 DIAGNOSIS — Z993 Dependence on wheelchair: Secondary | ICD-10-CM | POA: Insufficient documentation

## 2024-04-11 DIAGNOSIS — E785 Hyperlipidemia, unspecified: Secondary | ICD-10-CM | POA: Diagnosis not present

## 2024-04-11 DIAGNOSIS — Z800 Family history of malignant neoplasm of digestive organs: Secondary | ICD-10-CM | POA: Insufficient documentation

## 2024-04-11 DIAGNOSIS — Z87442 Personal history of urinary calculi: Secondary | ICD-10-CM | POA: Insufficient documentation

## 2024-04-11 DIAGNOSIS — I10 Essential (primary) hypertension: Secondary | ICD-10-CM | POA: Diagnosis not present

## 2024-04-11 DIAGNOSIS — Z86718 Personal history of other venous thrombosis and embolism: Secondary | ICD-10-CM | POA: Diagnosis not present

## 2024-04-11 DIAGNOSIS — Z801 Family history of malignant neoplasm of trachea, bronchus and lung: Secondary | ICD-10-CM | POA: Insufficient documentation

## 2024-04-11 DIAGNOSIS — K219 Gastro-esophageal reflux disease without esophagitis: Secondary | ICD-10-CM | POA: Diagnosis not present

## 2024-04-11 DIAGNOSIS — F0390 Unspecified dementia without behavioral disturbance: Secondary | ICD-10-CM | POA: Diagnosis not present

## 2024-04-11 LAB — CBC WITH DIFFERENTIAL/PLATELET
Abs Immature Granulocytes: 0.06 10*3/uL (ref 0.00–0.07)
Basophils Absolute: 0.2 10*3/uL — ABNORMAL HIGH (ref 0.0–0.1)
Basophils Relative: 2 %
Eosinophils Absolute: 0.6 10*3/uL — ABNORMAL HIGH (ref 0.0–0.5)
Eosinophils Relative: 5 %
HCT: 58 % — ABNORMAL HIGH (ref 39.0–52.0)
Hemoglobin: 16.8 g/dL (ref 13.0–17.0)
Immature Granulocytes: 1 %
Lymphocytes Relative: 11 %
Lymphs Abs: 1.1 10*3/uL (ref 0.7–4.0)
MCH: 25.7 pg — ABNORMAL LOW (ref 26.0–34.0)
MCHC: 29 g/dL — ABNORMAL LOW (ref 30.0–36.0)
MCV: 88.8 fL (ref 80.0–100.0)
Monocytes Absolute: 0.8 10*3/uL (ref 0.1–1.0)
Monocytes Relative: 8 %
Neutro Abs: 8 10*3/uL — ABNORMAL HIGH (ref 1.7–7.7)
Neutrophils Relative %: 73 %
Platelets: 533 10*3/uL — ABNORMAL HIGH (ref 150–400)
RBC: 6.53 MIL/uL — ABNORMAL HIGH (ref 4.22–5.81)
RDW: 22.4 % — ABNORMAL HIGH (ref 11.5–15.5)
WBC: 10.7 10*3/uL — ABNORMAL HIGH (ref 4.0–10.5)
nRBC: 0 % (ref 0.0–0.2)

## 2024-04-11 LAB — IRON AND TIBC
Iron: 42 ug/dL — ABNORMAL LOW (ref 45–182)
Saturation Ratios: 12 % — ABNORMAL LOW (ref 17.9–39.5)
TIBC: 341 ug/dL (ref 250–450)
UIBC: 299 ug/dL

## 2024-04-11 LAB — FERRITIN: Ferritin: 13 ng/mL — ABNORMAL LOW (ref 24–336)

## 2024-04-11 MED ORDER — MEGESTROL ACETATE 400 MG/10ML PO SUSP: 400.0000 mg | Freq: Two times a day (BID) | ORAL | 2 refills | Status: AC

## 2024-04-11 NOTE — Patient Instructions (Addendum)
 [facility] at [facility] Discharge Instructions   You were seen and examined today by Dr. Cheree Cords.  He reviewed the results of your lab work. Your platelets have come down to 533. Your hemoglobin has increased. Dr. Linnell Richardson would like for you to stop taking the iron pill.   Dr. Linnell Richardson wants you to start taking the Hydrea  every other day.   We will see you back in 4 weeks. We will repeat lab work at that time.   Return as scheduled.    Thank you for choosing [facility] at [facility] to provide your oncology and hematology care.  To afford each patient quality time with our provider, please arrive at least 15 minutes before your scheduled appointment time.   If you have a lab appointment with the Cancer Center please come in thru the Main Entrance and check in at the main information desk.  You need to re-schedule your appointment should you arrive 10 or more minutes late.  We strive to give you quality time with our providers, and arriving late affects you and other patients whose appointments are after yours.  Also, if you no show three or more times for appointments you may be dismissed from the clinic at the providers discretion.     Again, thank you for choosing [facility].  Our hope is that these requests will decrease the amount of time that you wait before being seen by our physicians.       _____________________________________________________________  Should you have questions after your visit to [facility], please contact our office at [phone] and follow the prompts.  Our office hours are 8:00 a.m. and 4:30 p.m. Monday - Friday.  Please note that voicemails left after 4:00 p.m. may not be returned until the following business day.  We are closed weekends and major holidays.  You do have access to a nurse 24-7, just call the main number to the clinic [phone] and do not press any options, hold on the line and  a nurse will answer the phone.    For prescription refill requests, have your pharmacy contact our office and allow 72 hours.    Due to Covid, you will need to wear a mask upon entering the hospital. If you do not have a mask, a mask will be given to you at the Main Entrance upon arrival. For doctor visits, patients may have 1 support person age 16 or older with them. For treatment visits, patients can not have anyone with them due to social distancing guidelines and our immunocompromised population.

## 2024-04-11 NOTE — Progress Notes (Signed)
Patient is taking Hydrea as prescribed.  He has not missed any doses and reports no side effects at this time.   

## 2024-04-15 DIAGNOSIS — F4310 Post-traumatic stress disorder, unspecified: Secondary | ICD-10-CM | POA: Diagnosis not present

## 2024-04-15 DIAGNOSIS — G4733 Obstructive sleep apnea (adult) (pediatric): Secondary | ICD-10-CM | POA: Diagnosis not present

## 2024-04-15 DIAGNOSIS — G8220 Paraplegia, unspecified: Secondary | ICD-10-CM | POA: Diagnosis not present

## 2024-04-15 DIAGNOSIS — F32A Depression, unspecified: Secondary | ICD-10-CM | POA: Diagnosis not present

## 2024-04-15 DIAGNOSIS — T25321D Burn of third degree of right foot, subsequent encounter: Secondary | ICD-10-CM | POA: Diagnosis not present

## 2024-04-15 DIAGNOSIS — I5022 Chronic systolic (congestive) heart failure: Secondary | ICD-10-CM | POA: Diagnosis not present

## 2024-04-15 DIAGNOSIS — S12100D Unspecified displaced fracture of second cervical vertebra, subsequent encounter for fracture with routine healing: Secondary | ICD-10-CM | POA: Diagnosis not present

## 2024-04-15 DIAGNOSIS — E669 Obesity, unspecified: Secondary | ICD-10-CM | POA: Diagnosis not present

## 2024-04-15 DIAGNOSIS — S62102D Fracture of unspecified carpal bone, left wrist, subsequent encounter for fracture with routine healing: Secondary | ICD-10-CM | POA: Diagnosis not present

## 2024-04-15 DIAGNOSIS — I2510 Atherosclerotic heart disease of native coronary artery without angina pectoris: Secondary | ICD-10-CM | POA: Diagnosis not present

## 2024-04-15 DIAGNOSIS — Z6833 Body mass index (BMI) 33.0-33.9, adult: Secondary | ICD-10-CM | POA: Diagnosis not present

## 2024-04-15 DIAGNOSIS — I69351 Hemiplegia and hemiparesis following cerebral infarction affecting right dominant side: Secondary | ICD-10-CM | POA: Diagnosis not present

## 2024-04-15 DIAGNOSIS — M1990 Unspecified osteoarthritis, unspecified site: Secondary | ICD-10-CM | POA: Diagnosis not present

## 2024-04-15 DIAGNOSIS — C61 Malignant neoplasm of prostate: Secondary | ICD-10-CM | POA: Diagnosis not present

## 2024-04-15 DIAGNOSIS — I110 Hypertensive heart disease with heart failure: Secondary | ICD-10-CM | POA: Diagnosis not present

## 2024-04-15 DIAGNOSIS — E538 Deficiency of other specified B group vitamins: Secondary | ICD-10-CM | POA: Diagnosis not present

## 2024-04-15 DIAGNOSIS — I739 Peripheral vascular disease, unspecified: Secondary | ICD-10-CM | POA: Diagnosis not present

## 2024-04-15 DIAGNOSIS — F02A4 Dementia in other diseases classified elsewhere, mild, with anxiety: Secondary | ICD-10-CM | POA: Diagnosis not present

## 2024-04-15 DIAGNOSIS — J45909 Unspecified asthma, uncomplicated: Secondary | ICD-10-CM | POA: Diagnosis not present

## 2024-04-15 DIAGNOSIS — F02A3 Dementia in other diseases classified elsewhere, mild, with mood disturbance: Secondary | ICD-10-CM | POA: Diagnosis not present

## 2024-04-15 DIAGNOSIS — E440 Moderate protein-calorie malnutrition: Secondary | ICD-10-CM | POA: Diagnosis not present

## 2024-04-15 DIAGNOSIS — D630 Anemia in neoplastic disease: Secondary | ICD-10-CM | POA: Diagnosis not present

## 2024-04-15 DIAGNOSIS — G4700 Insomnia, unspecified: Secondary | ICD-10-CM | POA: Diagnosis not present

## 2024-04-15 DIAGNOSIS — E785 Hyperlipidemia, unspecified: Secondary | ICD-10-CM | POA: Diagnosis not present

## 2024-04-15 DIAGNOSIS — K219 Gastro-esophageal reflux disease without esophagitis: Secondary | ICD-10-CM | POA: Diagnosis not present

## 2024-04-17 DIAGNOSIS — I5022 Chronic systolic (congestive) heart failure: Secondary | ICD-10-CM | POA: Diagnosis not present

## 2024-04-17 DIAGNOSIS — S62102D Fracture of unspecified carpal bone, left wrist, subsequent encounter for fracture with routine healing: Secondary | ICD-10-CM | POA: Diagnosis not present

## 2024-04-17 DIAGNOSIS — S12100D Unspecified displaced fracture of second cervical vertebra, subsequent encounter for fracture with routine healing: Secondary | ICD-10-CM | POA: Diagnosis not present

## 2024-04-17 DIAGNOSIS — I110 Hypertensive heart disease with heart failure: Secondary | ICD-10-CM | POA: Diagnosis not present

## 2024-04-17 DIAGNOSIS — T25321D Burn of third degree of right foot, subsequent encounter: Secondary | ICD-10-CM | POA: Diagnosis not present

## 2024-04-17 DIAGNOSIS — I2510 Atherosclerotic heart disease of native coronary artery without angina pectoris: Secondary | ICD-10-CM | POA: Diagnosis not present

## 2024-04-24 DIAGNOSIS — S62102D Fracture of unspecified carpal bone, left wrist, subsequent encounter for fracture with routine healing: Secondary | ICD-10-CM | POA: Diagnosis not present

## 2024-04-24 DIAGNOSIS — I5022 Chronic systolic (congestive) heart failure: Secondary | ICD-10-CM | POA: Diagnosis not present

## 2024-04-24 DIAGNOSIS — T25321D Burn of third degree of right foot, subsequent encounter: Secondary | ICD-10-CM | POA: Diagnosis not present

## 2024-04-24 DIAGNOSIS — I2510 Atherosclerotic heart disease of native coronary artery without angina pectoris: Secondary | ICD-10-CM | POA: Diagnosis not present

## 2024-04-24 DIAGNOSIS — I110 Hypertensive heart disease with heart failure: Secondary | ICD-10-CM | POA: Diagnosis not present

## 2024-04-24 DIAGNOSIS — S12100D Unspecified displaced fracture of second cervical vertebra, subsequent encounter for fracture with routine healing: Secondary | ICD-10-CM | POA: Diagnosis not present

## 2024-04-27 ENCOUNTER — Ambulatory Visit: Attending: Nurse Practitioner | Admitting: Nurse Practitioner

## 2024-04-27 ENCOUNTER — Encounter: Payer: Self-pay | Admitting: Nurse Practitioner

## 2024-04-27 VITALS — BP 132/84 | HR 48 | Ht 75.0 in | Wt 270.0 lb

## 2024-04-27 DIAGNOSIS — I491 Atrial premature depolarization: Secondary | ICD-10-CM | POA: Insufficient documentation

## 2024-04-27 DIAGNOSIS — Z79899 Other long term (current) drug therapy: Secondary | ICD-10-CM | POA: Diagnosis not present

## 2024-04-27 DIAGNOSIS — I10 Essential (primary) hypertension: Secondary | ICD-10-CM | POA: Insufficient documentation

## 2024-04-27 DIAGNOSIS — I493 Ventricular premature depolarization: Secondary | ICD-10-CM | POA: Diagnosis not present

## 2024-04-27 DIAGNOSIS — Z8673 Personal history of transient ischemic attack (TIA), and cerebral infarction without residual deficits: Secondary | ICD-10-CM | POA: Insufficient documentation

## 2024-04-27 DIAGNOSIS — I2510 Atherosclerotic heart disease of native coronary artery without angina pectoris: Secondary | ICD-10-CM | POA: Diagnosis not present

## 2024-04-27 DIAGNOSIS — I5022 Chronic systolic (congestive) heart failure: Secondary | ICD-10-CM | POA: Insufficient documentation

## 2024-04-27 DIAGNOSIS — G4733 Obstructive sleep apnea (adult) (pediatric): Secondary | ICD-10-CM | POA: Diagnosis not present

## 2024-04-27 MED ORDER — ENTRESTO 97-103 MG PO TABS: 1.0000 | ORAL_TABLET | Freq: Two times a day (BID) | ORAL | 5 refills | Status: DC

## 2024-04-27 NOTE — Progress Notes (Unsigned)
 Cardiology Office Note:  .   Date:  04/27/2024 ID:  Cody Velez, DOB 05-21-1946, MRN [id number] PCP: Artemisa Bile, MD  [facility] Providers Cardiologist:  Teddie Favre, MD    History of Present Illness: .   Cody Velez is a 78 y.o. male with a PMH of HFrEF, HTN, CAD, frequent PVC's, hx of CVA, and OSA on CPAP, who presents today for 19-month follow-up appointment.  Heart healthy diet and regular cardiovascular exercise encouraged.  TTE revealed EF 30 to 35%, global hypokinesis, felt to be NICM.  Last seen by Dr. Londa Rival on December 08, 2023.  At the time, was seeing [facility] and [facility] cardiology.  Patient mention his preference to continue with [facility] for his cardiac needs.  Noted difficult time weighing at home but noted improvement with leg swelling while taking Lasix  daily.   TTE in March 2025 revealed EF 35 to 40%, global hypokinesis of left ventricle, grade 1 DD, no significant valvular abnormalities.  Today he presents for follow-up.  He states he is doing well. Denies any acute cardiac complaints or issues. Denies any chest pain, shortness of breath, palpitations, syncope, presyncope, dizziness, orthopnea, PND, swelling or significant weight changes, acute bleeding, or claudication.  ROS: Negative. See HPI.   Studies Reviewed: Aaron Aas    EKG:  EKG Interpretation Date/Time:  Friday Apr 27 2024 15:12:41 EDT Ventricular Rate:  71 PR Interval:  190 QRS Duration:  114 QT Interval:  436 QTC Calculation: 473 R Axis:   -49  Text Interpretation: Sinus rhythm with frequent Premature ventricular complexes and Premature atrial complexes Left axis deviation When compared with ECG of 04-Oct-2023 19:44, PREVIOUS ECG IS PRESENT Confirmed by Lasalle Pointer [phone] on 04/27/2024 3:15:40 PM   Echo 02/2024:  1. Left ventricular ejection fraction, by estimation, is 35 to 40%. The  left ventricle has moderately decreased function. The left ventricle  demonstrates global hypokinesis.  Left ventricular diastolic parameters are consistent with Grade I diastolic dysfunction (impaired relaxation). The average left ventricular global longitudinal strain is -11.6 %. The global longitudinal strain is abnormal.   2. Right ventricular systolic function is normal. The right ventricular  size is normal. Tricuspid regurgitation signal is inadequate for assessing  PA pressure.   3. The mitral valve is normal in structure. No evidence of mitral valve  regurgitation. No evidence of mitral stenosis.   4. The aortic valve is tricuspid. Aortic valve regurgitation is not  visualized. No aortic stenosis is present.   5. The inferior vena cava is normal in size with greater than 50%  respiratory variability, suggesting right atrial pressure of 3 mmHg.   Comparison(s): A prior study was performed on 10/03/2023. TDS-EF 30-35%. Moderate LVH.  Cardiac monitor 08/2023:  ZIO XT reviewed.  2 days, 23 hours analyzed.   Predominant rhythm is sinus with heart rate ranging from 47 bpm up to 103 bpm and average heart rate 65 bpm. There were frequent PACs representing approximately 39% total beats with otherwise occasional atrial couplets and rare atrial triplets. There were occasional PVCs representing approximately 3% total beats with otherwise rare ventricular couplets and triplets as well as limited episodes of ventricular bigeminy and trigeminy. Two brief episodes of NSVT were noted, the longest of which was only 4 beats. There were also 5 episodes of PSVT, the longest of which was only 8 beats. No sustained arrhythmias, pauses, or heart block noted.  TEE 03/2018:  Study Conclusions   - Procedure narrative: Transesophageal echocardiography. Topical  anesthesia was obtained using viscous lidocaine . A    transesophageal probe was inserted by the attending cardiologist.    Image quality was poor. This was a technically difficult study.  - Left ventricle: Systolic function was normal. The estimated     ejection fraction was in the range of 55% to 60%.  - Descending aorta: The descending aorta had moderate diffuse    disease.  - Mitral valve: There was trivial regurgitation.  - Left atrium: No evidence of thrombus in the atrial cavity or    appendage.  - Tricuspid valve: There was trivial regurgitation.  - Pulmonic valve: There was trivial regurgitation.   Impressions:   - Images were overall limited. Valves best visualized with    nonstandard views. There were no obvious valvular vegetations.  Lexiscan  02/2018:  There was no ST segment deviation noted during stress. The study is normal. This is a low risk study. The left ventricular ejection fraction is normal (55-65%).   Normal resting and stress perfusion.   No RWMA;s EF 57%   Physical Exam:   VS:  BP 132/84   Pulse (!) 48   Ht 6\' 3"  (1.905 m)   Wt 270 lb (122.5 kg)   SpO2 96%   BMI 33.75 kg/m    Wt Readings from Last 3 Encounters:  04/27/24 270 lb (122.5 kg)  02/21/24 270 lb (122.5 kg)  01/05/24 270 lb (122.5 kg)    GEN: Obese, 78 y.o. male in no acute distress NECK: No JVD; No carotid bruits CARDIAC: S1/S2, irregular rhythm, no murmurs, rubs, gallops RESPIRATORY:  Clear to auscultation without rales, wheezing or rhonchi  ABDOMEN: Soft, non-tender, non-distended EXTREMITIES:  No edema; No deformity   ASSESSMENT AND PLAN: .    HFrEF, medication management Stage C, NYHA class I-II symptoms. EF 35-40%, somewhat improved from previous study. Euvolemic and well compensated on exam.  Will increase Entresto  to 97/103 mg twice daily and repeat BMET in 2 weeks.  Continue Jardiance , Lasix  as needed, Toprol-XL and spironolactone . Low sodium diet, fluid restriction <2L, and daily weights encouraged. Educated to contact our office for weight gain of 2 lbs overnight or 5 lbs in one week.  CAD Heart catheterization in 2015 showed mild nonobstructive CAD. Stable with no anginal symptoms. No indication for ischemic evaluation.  Continue current medication regimen. Heart healthy diet and regular cardiovascular exercise encouraged.   HTN BP stable. Discussed to monitor BP at home at least 2 hours after medications and sitting for 5-10 minutes.  Increasing Entresto  to 97/103 mg twice daily.  No other medication changes at this time.  Will obtain BMET in 2 weeks per protocol. Heart healthy diet and regular cardiovascular exercise encouraged.   Frequent PVC's and PAC's Noted to have pseudobradycardia today with vital sign checked due to frequent PVCs and PACs on exam.  He is asymptomatic with this.  Continue Toprol-XL. Heart healthy diet and regular cardiovascular exercise encouraged.   OSA on CPAP Encouraged continued compliance.  Hx of CVA Denies any issues.  Continue Plavix . Continue to follow with PCP.    Dispo: Follow-up with Dr. Londa Rival or APP in 3 months or sooner if anything changes.   Signed, Lasalle Pointer, NP

## 2024-04-27 NOTE — Patient Instructions (Addendum)
 Medication Instructions:  Your physician has recommended you make the following change in your medication:  Increase your Entresto  from 49-51 to 97-103 twice daily Continue taking all other medications as prescribed   Labwork: BMET in 2 weeks at [facility] in [place]  Testing/Procedures: None  Follow-Up: Your physician recommends that you schedule a follow-up appointment in: 3 months  Any Other Special Instructions Will Be Listed Below (If Applicable). HEART FAILURE INSTRUCTION SHEET  Follow a low-salt diet-you are allowed no more than 2,000 mg of sodium per day. Watch your fluid intake. In general, you should not be taking more than 64 ounces a day (no more than 8 glasses per day). Sometimes we refer to this as "2 liters per day." This includes sources of water  in food like soup, coffee, tea, milk etc. Weigh yourself on the same scale at the same time of the day preferably immediately after your first void. Keep a log of your weights. Call your doctor: (Anytime you feel any of the following symptoms)  3 lbs weight gain overnight or 5 lbs within a week Shortness of breath, with or without a day hacking cough Swelling in hands, feet or stomach If you have to sleep on extra pillows at night in order to breathe   IT IS IMPORTANT TO LET YOUR DOCTOR KNOW EARLY ON IF YOU ARE HAVING SYMPTOMS SO WE CAN HELP YOU!      If you need a refill on your cardiac medications before your next appointment, please call your pharmacy.

## 2024-05-02 DIAGNOSIS — I5022 Chronic systolic (congestive) heart failure: Secondary | ICD-10-CM | POA: Diagnosis not present

## 2024-05-02 DIAGNOSIS — I110 Hypertensive heart disease with heart failure: Secondary | ICD-10-CM | POA: Diagnosis not present

## 2024-05-02 DIAGNOSIS — S12100D Unspecified displaced fracture of second cervical vertebra, subsequent encounter for fracture with routine healing: Secondary | ICD-10-CM | POA: Diagnosis not present

## 2024-05-02 DIAGNOSIS — T25321D Burn of third degree of right foot, subsequent encounter: Secondary | ICD-10-CM | POA: Diagnosis not present

## 2024-05-02 DIAGNOSIS — S62102D Fracture of unspecified carpal bone, left wrist, subsequent encounter for fracture with routine healing: Secondary | ICD-10-CM | POA: Diagnosis not present

## 2024-05-02 DIAGNOSIS — I2510 Atherosclerotic heart disease of native coronary artery without angina pectoris: Secondary | ICD-10-CM | POA: Diagnosis not present

## 2024-05-07 DIAGNOSIS — T24311D Burn of third degree of right thigh, subsequent encounter: Secondary | ICD-10-CM | POA: Diagnosis not present

## 2024-05-07 DIAGNOSIS — T25321D Burn of third degree of right foot, subsequent encounter: Secondary | ICD-10-CM | POA: Diagnosis not present

## 2024-05-07 DIAGNOSIS — L97519 Non-pressure chronic ulcer of other part of right foot with unspecified severity: Secondary | ICD-10-CM | POA: Diagnosis not present

## 2024-05-07 DIAGNOSIS — I5020 Unspecified systolic (congestive) heart failure: Secondary | ICD-10-CM | POA: Diagnosis not present

## 2024-05-07 DIAGNOSIS — T310 Burns involving less than 10% of body surface: Secondary | ICD-10-CM | POA: Diagnosis not present

## 2024-05-07 DIAGNOSIS — T25311D Burn of third degree of right ankle, subsequent encounter: Secondary | ICD-10-CM | POA: Diagnosis not present

## 2024-05-07 DIAGNOSIS — L97512 Non-pressure chronic ulcer of other part of right foot with fat layer exposed: Secondary | ICD-10-CM | POA: Diagnosis not present

## 2024-05-09 ENCOUNTER — Inpatient Hospital Stay

## 2024-05-09 ENCOUNTER — Inpatient Hospital Stay: Attending: Hematology | Admitting: Hematology

## 2024-05-09 VITALS — BP 143/82 | HR 62 | Temp 98.6°F | Resp 20

## 2024-05-09 DIAGNOSIS — Z8042 Family history of malignant neoplasm of prostate: Secondary | ICD-10-CM | POA: Insufficient documentation

## 2024-05-09 DIAGNOSIS — Z8546 Personal history of malignant neoplasm of prostate: Secondary | ICD-10-CM | POA: Insufficient documentation

## 2024-05-09 DIAGNOSIS — Z801 Family history of malignant neoplasm of trachea, bronchus and lung: Secondary | ICD-10-CM | POA: Insufficient documentation

## 2024-05-09 DIAGNOSIS — Z86718 Personal history of other venous thrombosis and embolism: Secondary | ICD-10-CM | POA: Diagnosis not present

## 2024-05-09 DIAGNOSIS — Z808 Family history of malignant neoplasm of other organs or systems: Secondary | ICD-10-CM | POA: Diagnosis not present

## 2024-05-09 DIAGNOSIS — Z79899 Other long term (current) drug therapy: Secondary | ICD-10-CM | POA: Diagnosis not present

## 2024-05-09 DIAGNOSIS — D471 Chronic myeloproliferative disease: Secondary | ICD-10-CM | POA: Diagnosis not present

## 2024-05-09 DIAGNOSIS — D75839 Thrombocytosis, unspecified: Secondary | ICD-10-CM | POA: Diagnosis not present

## 2024-05-09 DIAGNOSIS — Z803 Family history of malignant neoplasm of breast: Secondary | ICD-10-CM | POA: Insufficient documentation

## 2024-05-09 DIAGNOSIS — Z800 Family history of malignant neoplasm of digestive organs: Secondary | ICD-10-CM | POA: Diagnosis not present

## 2024-05-09 DIAGNOSIS — Z87891 Personal history of nicotine dependence: Secondary | ICD-10-CM | POA: Diagnosis not present

## 2024-05-09 LAB — COMPREHENSIVE METABOLIC PANEL WITH GFR
ALT: 10 U/L (ref 0–44)
AST: 12 U/L — ABNORMAL LOW (ref 15–41)
Albumin: 3.3 g/dL — ABNORMAL LOW (ref 3.5–5.0)
Alkaline Phosphatase: 69 U/L (ref 38–126)
Anion gap: 5 (ref 5–15)
BUN: 13 mg/dL (ref 8–23)
CO2: 27 mmol/L (ref 22–32)
Calcium: 9.1 mg/dL (ref 8.9–10.3)
Chloride: 103 mmol/L (ref 98–111)
Creatinine, Ser: 0.86 mg/dL (ref 0.61–1.24)
GFR, Estimated: >60 mL/min (ref >60)
Glucose, Bld: 77 mg/dL (ref 70–99)
Potassium: 3.6 mmol/L (ref 3.5–5.1)
Sodium: 135 mmol/L (ref 135–145)
Total Bilirubin: 0.6 mg/dL (ref 0.0–1.2)
Total Protein: 6.9 g/dL (ref 6.5–8.1)

## 2024-05-09 LAB — CBC WITH DIFFERENTIAL/PLATELET
Abs Immature Granulocytes: 0.07 10*3/uL (ref 0.00–0.07)
Basophils Absolute: 0.2 10*3/uL — ABNORMAL HIGH (ref 0.0–0.1)
Basophils Relative: 2 %
Eosinophils Absolute: 0.6 10*3/uL — ABNORMAL HIGH (ref 0.0–0.5)
Eosinophils Relative: 5 %
HCT: 57.4 % — ABNORMAL HIGH (ref 39.0–52.0)
Hemoglobin: 16.9 g/dL (ref 13.0–17.0)
Immature Granulocytes: 1 %
Lymphocytes Relative: 11 %
Lymphs Abs: 1.3 10*3/uL (ref 0.7–4.0)
MCH: 26.5 pg (ref 26.0–34.0)
MCHC: 29.4 g/dL — ABNORMAL LOW (ref 30.0–36.0)
MCV: 90.1 fL (ref 80.0–100.0)
Monocytes Absolute: 0.9 10*3/uL (ref 0.1–1.0)
Monocytes Relative: 7 %
Neutro Abs: 9 10*3/uL — ABNORMAL HIGH (ref 1.7–7.7)
Neutrophils Relative %: 74 %
Platelets: 664 10*3/uL — ABNORMAL HIGH (ref 150–400)
RBC: 6.37 MIL/uL — ABNORMAL HIGH (ref 4.22–5.81)
RDW: 20.3 % — ABNORMAL HIGH (ref 11.5–15.5)
WBC: 12 10*3/uL — ABNORMAL HIGH (ref 4.0–10.5)
nRBC: 0 % (ref 0.0–0.2)
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
## 2024-05-09 NOTE — Patient Instructions (Signed)
 [facility] at [facility] Discharge Instructions   You were seen and examined today by Dr. Cheree Cords.  He reviewed the results of your lab work which are normal/stable. Your platelets and white count have gone up since we have decreased the dose of Hydrea . Dr. Linnell Richardson would like you to go back to taking the Hydrea  once a day.   We will see you back in 4 weeks with repeat blood work.   Return as scheduled.    Thank you for choosing [facility] at [facility] to provide your oncology and hematology care.  To afford each patient quality time with our provider, please arrive at least 15 minutes before your scheduled appointment time.   If you have a lab appointment with the Cancer Center please come in thru the Main Entrance and check in at the main information desk.  You need to re-schedule your appointment should you arrive 10 or more minutes late.  We strive to give you quality time with our providers, and arriving late affects you and other patients whose appointments are after yours.  Also, if you no show three or more times for appointments you may be dismissed from the clinic at the providers discretion.     Again, thank you for choosing [facility].  Our hope is that these requests will decrease the amount of time that you wait before being seen by our physicians.       _____________________________________________________________  Should you have questions after your visit to [facility], please contact our office at [phone] and follow the prompts.  Our office hours are 8:00 a.m. and 4:30 p.m. Monday - Friday.  Please note that voicemails left after 4:00 p.m. may not be returned until the following business day.  We are closed weekends and major holidays.  You do have access to a nurse 24-7, just call the main number to the clinic [phone] and do not press any options, hold on the line and a nurse will answer the  phone.    For prescription refill requests, have your pharmacy contact our office and allow 72 hours.    Due to Covid, you will need to wear a mask upon entering the hospital. If you do not have a mask, a mask will be given to you at the Main Entrance upon arrival. For doctor visits, patients may have 1 support person age 23 or older with them. For treatment visits, patients can not have anyone with them due to social distancing guidelines and our immunocompromised population.

## 2024-05-10 DIAGNOSIS — S62102D Fracture of unspecified carpal bone, left wrist, subsequent encounter for fracture with routine healing: Secondary | ICD-10-CM | POA: Diagnosis not present

## 2024-05-10 DIAGNOSIS — I5022 Chronic systolic (congestive) heart failure: Secondary | ICD-10-CM | POA: Diagnosis not present

## 2024-05-10 DIAGNOSIS — T25321D Burn of third degree of right foot, subsequent encounter: Secondary | ICD-10-CM | POA: Diagnosis not present

## 2024-05-10 DIAGNOSIS — S12100D Unspecified displaced fracture of second cervical vertebra, subsequent encounter for fracture with routine healing: Secondary | ICD-10-CM | POA: Diagnosis not present

## 2024-05-10 DIAGNOSIS — I2510 Atherosclerotic heart disease of native coronary artery without angina pectoris: Secondary | ICD-10-CM | POA: Diagnosis not present

## 2024-05-10 DIAGNOSIS — I110 Hypertensive heart disease with heart failure: Secondary | ICD-10-CM | POA: Diagnosis not present

## 2024-05-21 DIAGNOSIS — T24311D Burn of third degree of right thigh, subsequent encounter: Secondary | ICD-10-CM | POA: Diagnosis not present

## 2024-05-21 DIAGNOSIS — L97319 Non-pressure chronic ulcer of right ankle with unspecified severity: Secondary | ICD-10-CM | POA: Diagnosis not present

## 2024-05-21 DIAGNOSIS — T25311D Burn of third degree of right ankle, subsequent encounter: Secondary | ICD-10-CM | POA: Diagnosis not present

## 2024-05-21 DIAGNOSIS — T25321D Burn of third degree of right foot, subsequent encounter: Secondary | ICD-10-CM | POA: Diagnosis not present

## 2024-05-21 DIAGNOSIS — G8220 Paraplegia, unspecified: Secondary | ICD-10-CM | POA: Diagnosis not present

## 2024-05-21 DIAGNOSIS — I5022 Chronic systolic (congestive) heart failure: Secondary | ICD-10-CM | POA: Diagnosis not present

## 2024-05-21 DIAGNOSIS — L97519 Non-pressure chronic ulcer of other part of right foot with unspecified severity: Secondary | ICD-10-CM | POA: Diagnosis not present

## 2024-05-21 DIAGNOSIS — Z8546 Personal history of malignant neoplasm of prostate: Secondary | ICD-10-CM | POA: Diagnosis not present

## 2024-05-21 DIAGNOSIS — L97512 Non-pressure chronic ulcer of other part of right foot with fat layer exposed: Secondary | ICD-10-CM | POA: Diagnosis not present

## 2024-05-21 DIAGNOSIS — D45 Polycythemia vera: Secondary | ICD-10-CM | POA: Diagnosis not present

## 2024-05-25 DIAGNOSIS — G44211 Episodic tension-type headache, intractable: Secondary | ICD-10-CM | POA: Diagnosis not present

## 2024-05-25 DIAGNOSIS — B351 Tinea unguium: Secondary | ICD-10-CM | POA: Diagnosis not present

## 2024-05-28 DIAGNOSIS — R519 Headache, unspecified: Secondary | ICD-10-CM | POA: Diagnosis not present

## 2024-05-29 DIAGNOSIS — Z8673 Personal history of transient ischemic attack (TIA), and cerebral infarction without residual deficits: Secondary | ICD-10-CM | POA: Diagnosis not present

## 2024-05-29 DIAGNOSIS — I5020 Unspecified systolic (congestive) heart failure: Secondary | ICD-10-CM | POA: Diagnosis not present

## 2024-05-29 DIAGNOSIS — L988 Other specified disorders of the skin and subcutaneous tissue: Secondary | ICD-10-CM | POA: Diagnosis not present

## 2024-05-29 DIAGNOSIS — A419 Sepsis, unspecified organism: Secondary | ICD-10-CM | POA: Diagnosis not present

## 2024-05-29 DIAGNOSIS — F329 Major depressive disorder, single episode, unspecified: Secondary | ICD-10-CM | POA: Diagnosis not present

## 2024-05-29 DIAGNOSIS — Z6835 Body mass index (BMI) 35.0-35.9, adult: Secondary | ICD-10-CM | POA: Diagnosis not present

## 2024-05-29 DIAGNOSIS — D75839 Thrombocytosis, unspecified: Secondary | ICD-10-CM | POA: Diagnosis not present

## 2024-05-29 DIAGNOSIS — R0902 Hypoxemia: Secondary | ICD-10-CM | POA: Diagnosis not present

## 2024-05-29 DIAGNOSIS — I252 Old myocardial infarction: Secondary | ICD-10-CM | POA: Diagnosis not present

## 2024-05-29 DIAGNOSIS — F17220 Nicotine dependence, chewing tobacco, uncomplicated: Secondary | ICD-10-CM | POA: Diagnosis not present

## 2024-05-29 DIAGNOSIS — D649 Anemia, unspecified: Secondary | ICD-10-CM | POA: Diagnosis not present

## 2024-05-29 DIAGNOSIS — M6281 Muscle weakness (generalized): Secondary | ICD-10-CM | POA: Diagnosis not present

## 2024-05-29 DIAGNOSIS — Z7984 Long term (current) use of oral hypoglycemic drugs: Secondary | ICD-10-CM | POA: Diagnosis not present

## 2024-05-29 DIAGNOSIS — F411 Generalized anxiety disorder: Secondary | ICD-10-CM | POA: Diagnosis not present

## 2024-05-29 DIAGNOSIS — I82433 Acute embolism and thrombosis of popliteal vein, bilateral: Secondary | ICD-10-CM | POA: Diagnosis not present

## 2024-05-29 DIAGNOSIS — R079 Chest pain, unspecified: Secondary | ICD-10-CM | POA: Diagnosis not present

## 2024-05-29 DIAGNOSIS — R0602 Shortness of breath: Secondary | ICD-10-CM | POA: Diagnosis not present

## 2024-05-29 DIAGNOSIS — N179 Acute kidney failure, unspecified: Secondary | ICD-10-CM | POA: Diagnosis not present

## 2024-05-29 DIAGNOSIS — I82403 Acute embolism and thrombosis of unspecified deep veins of lower extremity, bilateral: Secondary | ICD-10-CM | POA: Diagnosis not present

## 2024-05-29 DIAGNOSIS — Z87891 Personal history of nicotine dependence: Secondary | ICD-10-CM | POA: Diagnosis not present

## 2024-05-29 DIAGNOSIS — I82401 Acute embolism and thrombosis of unspecified deep veins of right lower extremity: Secondary | ICD-10-CM | POA: Diagnosis not present

## 2024-05-29 DIAGNOSIS — T24301A Burn of third degree of unspecified site of right lower limb, except ankle and foot, initial encounter: Secondary | ICD-10-CM | POA: Diagnosis not present

## 2024-05-29 DIAGNOSIS — I2692 Saddle embolus of pulmonary artery without acute cor pulmonale: Secondary | ICD-10-CM | POA: Diagnosis not present

## 2024-05-29 DIAGNOSIS — I4510 Unspecified right bundle-branch block: Secondary | ICD-10-CM | POA: Diagnosis not present

## 2024-05-29 DIAGNOSIS — R42 Dizziness and giddiness: Secondary | ICD-10-CM | POA: Diagnosis not present

## 2024-05-29 DIAGNOSIS — R1310 Dysphagia, unspecified: Secondary | ICD-10-CM | POA: Diagnosis not present

## 2024-05-29 DIAGNOSIS — R9431 Abnormal electrocardiogram [ECG] [EKG]: Secondary | ICD-10-CM | POA: Diagnosis not present

## 2024-05-29 DIAGNOSIS — J9601 Acute respiratory failure with hypoxia: Secondary | ICD-10-CM | POA: Diagnosis not present

## 2024-05-29 DIAGNOSIS — T24011D Burn of unspecified degree of right thigh, subsequent encounter: Secondary | ICD-10-CM | POA: Diagnosis not present

## 2024-05-29 DIAGNOSIS — B965 Pseudomonas (aeruginosa) (mallei) (pseudomallei) as the cause of diseases classified elsewhere: Secondary | ICD-10-CM | POA: Diagnosis not present

## 2024-05-29 DIAGNOSIS — R531 Weakness: Secondary | ICD-10-CM | POA: Diagnosis not present

## 2024-05-29 DIAGNOSIS — Z4589 Encounter for adjustment and management of other implanted devices: Secondary | ICD-10-CM | POA: Diagnosis not present

## 2024-05-29 DIAGNOSIS — M549 Dorsalgia, unspecified: Secondary | ICD-10-CM | POA: Diagnosis not present

## 2024-05-29 DIAGNOSIS — I2510 Atherosclerotic heart disease of native coronary artery without angina pectoris: Secondary | ICD-10-CM | POA: Diagnosis not present

## 2024-05-29 DIAGNOSIS — I428 Other cardiomyopathies: Secondary | ICD-10-CM | POA: Diagnosis not present

## 2024-05-29 DIAGNOSIS — I2699 Other pulmonary embolism without acute cor pulmonale: Secondary | ICD-10-CM | POA: Diagnosis not present

## 2024-05-29 DIAGNOSIS — D45 Polycythemia vera: Secondary | ICD-10-CM | POA: Diagnosis not present

## 2024-05-29 DIAGNOSIS — I214 Non-ST elevation (NSTEMI) myocardial infarction: Secondary | ICD-10-CM | POA: Diagnosis not present

## 2024-05-29 DIAGNOSIS — K219 Gastro-esophageal reflux disease without esophagitis: Secondary | ICD-10-CM | POA: Diagnosis not present

## 2024-05-29 DIAGNOSIS — I4891 Unspecified atrial fibrillation: Secondary | ICD-10-CM | POA: Diagnosis not present

## 2024-05-29 DIAGNOSIS — B3749 Other urogenital candidiasis: Secondary | ICD-10-CM | POA: Diagnosis not present

## 2024-05-29 DIAGNOSIS — I110 Hypertensive heart disease with heart failure: Secondary | ICD-10-CM | POA: Diagnosis not present

## 2024-05-29 DIAGNOSIS — E785 Hyperlipidemia, unspecified: Secondary | ICD-10-CM | POA: Diagnosis not present

## 2024-05-29 DIAGNOSIS — I82432 Acute embolism and thrombosis of left popliteal vein: Secondary | ICD-10-CM | POA: Diagnosis not present

## 2024-05-29 DIAGNOSIS — I82442 Acute embolism and thrombosis of left tibial vein: Secondary | ICD-10-CM | POA: Diagnosis not present

## 2024-05-29 DIAGNOSIS — E66812 Obesity, class 2: Secondary | ICD-10-CM | POA: Diagnosis not present

## 2024-05-29 DIAGNOSIS — D72829 Elevated white blood cell count, unspecified: Secondary | ICD-10-CM | POA: Diagnosis not present

## 2024-05-29 DIAGNOSIS — R6521 Severe sepsis with septic shock: Secondary | ICD-10-CM | POA: Diagnosis not present

## 2024-05-29 DIAGNOSIS — G8220 Paraplegia, unspecified: Secondary | ICD-10-CM | POA: Diagnosis not present

## 2024-05-29 DIAGNOSIS — I080 Rheumatic disorders of both mitral and aortic valves: Secondary | ICD-10-CM | POA: Diagnosis not present

## 2024-05-29 DIAGNOSIS — I82411 Acute embolism and thrombosis of right femoral vein: Secondary | ICD-10-CM | POA: Diagnosis not present

## 2024-05-29 DIAGNOSIS — D471 Chronic myeloproliferative disease: Secondary | ICD-10-CM | POA: Diagnosis not present

## 2024-05-29 DIAGNOSIS — D6859 Other primary thrombophilia: Secondary | ICD-10-CM | POA: Diagnosis not present

## 2024-05-29 DIAGNOSIS — R4182 Altered mental status, unspecified: Secondary | ICD-10-CM | POA: Diagnosis not present

## 2024-05-29 DIAGNOSIS — R69 Illness, unspecified: Secondary | ICD-10-CM | POA: Diagnosis not present

## 2024-05-29 DIAGNOSIS — N390 Urinary tract infection, site not specified: Secondary | ICD-10-CM | POA: Diagnosis not present

## 2024-05-29 DIAGNOSIS — G4733 Obstructive sleep apnea (adult) (pediatric): Secondary | ICD-10-CM | POA: Diagnosis not present

## 2024-05-29 DIAGNOSIS — M6282 Rhabdomyolysis: Secondary | ICD-10-CM | POA: Diagnosis not present

## 2024-05-29 DIAGNOSIS — R0603 Acute respiratory distress: Secondary | ICD-10-CM | POA: Diagnosis not present

## 2024-05-29 DIAGNOSIS — I2602 Saddle embolus of pulmonary artery with acute cor pulmonale: Secondary | ICD-10-CM | POA: Diagnosis not present

## 2024-05-29 DIAGNOSIS — G9340 Encephalopathy, unspecified: Secondary | ICD-10-CM | POA: Diagnosis not present

## 2024-05-29 DIAGNOSIS — G603 Idiopathic progressive neuropathy: Secondary | ICD-10-CM | POA: Diagnosis not present

## 2024-05-29 DIAGNOSIS — Z87440 Personal history of urinary (tract) infections: Secondary | ICD-10-CM | POA: Diagnosis not present

## 2024-05-29 DIAGNOSIS — I5022 Chronic systolic (congestive) heart failure: Secondary | ICD-10-CM | POA: Diagnosis not present

## 2024-05-29 DIAGNOSIS — T24331A Burn of third degree of right lower leg, initial encounter: Secondary | ICD-10-CM | POA: Diagnosis not present

## 2024-05-29 DIAGNOSIS — C61 Malignant neoplasm of prostate: Secondary | ICD-10-CM | POA: Diagnosis not present

## 2024-05-29 DIAGNOSIS — G8929 Other chronic pain: Secondary | ICD-10-CM | POA: Diagnosis not present

## 2024-06-01 DIAGNOSIS — I2692 Saddle embolus of pulmonary artery without acute cor pulmonale: Secondary | ICD-10-CM | POA: Diagnosis not present

## 2024-06-05 ENCOUNTER — Inpatient Hospital Stay

## 2024-06-05 ENCOUNTER — Inpatient Hospital Stay: Admitting: Hematology

## 2024-06-07 DIAGNOSIS — G609 Hereditary and idiopathic neuropathy, unspecified: Secondary | ICD-10-CM | POA: Diagnosis not present

## 2024-06-07 DIAGNOSIS — I2699 Other pulmonary embolism without acute cor pulmonale: Secondary | ICD-10-CM | POA: Diagnosis not present

## 2024-06-07 DIAGNOSIS — F329 Major depressive disorder, single episode, unspecified: Secondary | ICD-10-CM | POA: Diagnosis not present

## 2024-06-07 DIAGNOSIS — I10 Essential (primary) hypertension: Secondary | ICD-10-CM | POA: Diagnosis not present

## 2024-06-07 DIAGNOSIS — Z8249 Family history of ischemic heart disease and other diseases of the circulatory system: Secondary | ICD-10-CM | POA: Diagnosis not present

## 2024-06-07 DIAGNOSIS — E785 Hyperlipidemia, unspecified: Secondary | ICD-10-CM | POA: Diagnosis not present

## 2024-06-07 DIAGNOSIS — I4891 Unspecified atrial fibrillation: Secondary | ICD-10-CM | POA: Diagnosis not present

## 2024-06-07 DIAGNOSIS — L988 Other specified disorders of the skin and subcutaneous tissue: Secondary | ICD-10-CM | POA: Diagnosis not present

## 2024-06-07 DIAGNOSIS — G8220 Paraplegia, unspecified: Secondary | ICD-10-CM | POA: Diagnosis not present

## 2024-06-07 DIAGNOSIS — Z800 Family history of malignant neoplasm of digestive organs: Secondary | ICD-10-CM | POA: Diagnosis not present

## 2024-06-07 DIAGNOSIS — Z87891 Personal history of nicotine dependence: Secondary | ICD-10-CM | POA: Diagnosis not present

## 2024-06-07 DIAGNOSIS — M549 Dorsalgia, unspecified: Secondary | ICD-10-CM | POA: Diagnosis not present

## 2024-06-07 DIAGNOSIS — I5022 Chronic systolic (congestive) heart failure: Secondary | ICD-10-CM | POA: Diagnosis not present

## 2024-06-07 DIAGNOSIS — M25562 Pain in left knee: Secondary | ICD-10-CM | POA: Diagnosis not present

## 2024-06-07 DIAGNOSIS — R001 Bradycardia, unspecified: Secondary | ICD-10-CM | POA: Diagnosis not present

## 2024-06-07 DIAGNOSIS — Z7901 Long term (current) use of anticoagulants: Secondary | ICD-10-CM | POA: Diagnosis not present

## 2024-06-07 DIAGNOSIS — R0602 Shortness of breath: Secondary | ICD-10-CM | POA: Diagnosis not present

## 2024-06-07 DIAGNOSIS — A419 Sepsis, unspecified organism: Secondary | ICD-10-CM | POA: Diagnosis not present

## 2024-06-07 DIAGNOSIS — Z801 Family history of malignant neoplasm of trachea, bronchus and lung: Secondary | ICD-10-CM | POA: Diagnosis not present

## 2024-06-07 DIAGNOSIS — Z87440 Personal history of urinary (tract) infections: Secondary | ICD-10-CM | POA: Diagnosis not present

## 2024-06-07 DIAGNOSIS — I480 Paroxysmal atrial fibrillation: Secondary | ICD-10-CM | POA: Diagnosis not present

## 2024-06-07 DIAGNOSIS — Z8042 Family history of malignant neoplasm of prostate: Secondary | ICD-10-CM | POA: Diagnosis not present

## 2024-06-07 DIAGNOSIS — R42 Dizziness and giddiness: Secondary | ICD-10-CM | POA: Diagnosis not present

## 2024-06-07 DIAGNOSIS — Z6835 Body mass index (BMI) 35.0-35.9, adult: Secondary | ICD-10-CM | POA: Diagnosis not present

## 2024-06-07 DIAGNOSIS — Z8673 Personal history of transient ischemic attack (TIA), and cerebral infarction without residual deficits: Secondary | ICD-10-CM | POA: Diagnosis not present

## 2024-06-07 DIAGNOSIS — F411 Generalized anxiety disorder: Secondary | ICD-10-CM | POA: Diagnosis not present

## 2024-06-07 DIAGNOSIS — Z7902 Long term (current) use of antithrombotics/antiplatelets: Secondary | ICD-10-CM | POA: Diagnosis not present

## 2024-06-07 DIAGNOSIS — M25061 Hemarthrosis, right knee: Secondary | ICD-10-CM | POA: Diagnosis not present

## 2024-06-07 DIAGNOSIS — Z86718 Personal history of other venous thrombosis and embolism: Secondary | ICD-10-CM | POA: Diagnosis not present

## 2024-06-07 DIAGNOSIS — Z8546 Personal history of malignant neoplasm of prostate: Secondary | ICD-10-CM | POA: Diagnosis not present

## 2024-06-07 DIAGNOSIS — I110 Hypertensive heart disease with heart failure: Secondary | ICD-10-CM | POA: Diagnosis not present

## 2024-06-07 DIAGNOSIS — D649 Anemia, unspecified: Secondary | ICD-10-CM | POA: Diagnosis not present

## 2024-06-07 DIAGNOSIS — D72829 Elevated white blood cell count, unspecified: Secondary | ICD-10-CM | POA: Diagnosis not present

## 2024-06-07 DIAGNOSIS — T24011D Burn of unspecified degree of right thigh, subsequent encounter: Secondary | ICD-10-CM | POA: Diagnosis not present

## 2024-06-07 DIAGNOSIS — E66812 Obesity, class 2: Secondary | ICD-10-CM | POA: Diagnosis not present

## 2024-06-07 DIAGNOSIS — G8929 Other chronic pain: Secondary | ICD-10-CM | POA: Diagnosis not present

## 2024-06-07 DIAGNOSIS — F17290 Nicotine dependence, other tobacco product, uncomplicated: Secondary | ICD-10-CM | POA: Diagnosis not present

## 2024-06-07 DIAGNOSIS — M25062 Hemarthrosis, left knee: Secondary | ICD-10-CM | POA: Diagnosis not present

## 2024-06-07 DIAGNOSIS — K219 Gastro-esophageal reflux disease without esophagitis: Secondary | ICD-10-CM | POA: Diagnosis not present

## 2024-06-07 DIAGNOSIS — I5020 Unspecified systolic (congestive) heart failure: Secondary | ICD-10-CM | POA: Diagnosis not present

## 2024-06-07 DIAGNOSIS — F4310 Post-traumatic stress disorder, unspecified: Secondary | ICD-10-CM | POA: Diagnosis not present

## 2024-06-07 DIAGNOSIS — G4733 Obstructive sleep apnea (adult) (pediatric): Secondary | ICD-10-CM | POA: Diagnosis not present

## 2024-06-07 DIAGNOSIS — I2602 Saddle embolus of pulmonary artery with acute cor pulmonale: Secondary | ICD-10-CM | POA: Diagnosis not present

## 2024-06-07 DIAGNOSIS — I2510 Atherosclerotic heart disease of native coronary artery without angina pectoris: Secondary | ICD-10-CM | POA: Diagnosis not present

## 2024-06-07 DIAGNOSIS — D471 Chronic myeloproliferative disease: Secondary | ICD-10-CM | POA: Diagnosis not present

## 2024-06-07 DIAGNOSIS — G603 Idiopathic progressive neuropathy: Secondary | ICD-10-CM | POA: Diagnosis not present

## 2024-06-08 DIAGNOSIS — G609 Hereditary and idiopathic neuropathy, unspecified: Secondary | ICD-10-CM | POA: Diagnosis not present

## 2024-06-08 DIAGNOSIS — I2699 Other pulmonary embolism without acute cor pulmonale: Secondary | ICD-10-CM | POA: Diagnosis not present

## 2024-06-08 DIAGNOSIS — I10 Essential (primary) hypertension: Secondary | ICD-10-CM | POA: Diagnosis not present

## 2024-06-08 DIAGNOSIS — I5020 Unspecified systolic (congestive) heart failure: Secondary | ICD-10-CM | POA: Diagnosis not present

## 2024-06-11 ENCOUNTER — Inpatient Hospital Stay

## 2024-06-11 ENCOUNTER — Inpatient Hospital Stay: Admitting: Hematology

## 2024-06-18 ENCOUNTER — Inpatient Hospital Stay: Attending: Hematology | Admitting: Hematology

## 2024-06-18 ENCOUNTER — Inpatient Hospital Stay

## 2024-06-18 VITALS — BP 127/79 | HR 40 | Temp 97.6°F | Resp 20

## 2024-06-18 DIAGNOSIS — Z800 Family history of malignant neoplasm of digestive organs: Secondary | ICD-10-CM | POA: Diagnosis not present

## 2024-06-18 DIAGNOSIS — Z7902 Long term (current) use of antithrombotics/antiplatelets: Secondary | ICD-10-CM | POA: Diagnosis not present

## 2024-06-18 DIAGNOSIS — Z8042 Family history of malignant neoplasm of prostate: Secondary | ICD-10-CM | POA: Insufficient documentation

## 2024-06-18 DIAGNOSIS — I2602 Saddle embolus of pulmonary artery with acute cor pulmonale: Secondary | ICD-10-CM | POA: Diagnosis not present

## 2024-06-18 DIAGNOSIS — Z801 Family history of malignant neoplasm of trachea, bronchus and lung: Secondary | ICD-10-CM | POA: Insufficient documentation

## 2024-06-18 DIAGNOSIS — F17290 Nicotine dependence, other tobacco product, uncomplicated: Secondary | ICD-10-CM | POA: Diagnosis not present

## 2024-06-18 DIAGNOSIS — Z8546 Personal history of malignant neoplasm of prostate: Secondary | ICD-10-CM | POA: Insufficient documentation

## 2024-06-18 DIAGNOSIS — Z8249 Family history of ischemic heart disease and other diseases of the circulatory system: Secondary | ICD-10-CM | POA: Diagnosis not present

## 2024-06-18 DIAGNOSIS — D471 Chronic myeloproliferative disease: Secondary | ICD-10-CM

## 2024-06-18 DIAGNOSIS — Z86718 Personal history of other venous thrombosis and embolism: Secondary | ICD-10-CM | POA: Diagnosis not present

## 2024-06-18 DIAGNOSIS — Z7901 Long term (current) use of anticoagulants: Secondary | ICD-10-CM | POA: Diagnosis not present

## 2024-06-18 LAB — CBC WITH DIFFERENTIAL/PLATELET
Abs Immature Granulocytes: 0.07 10*3/uL (ref 0.00–0.07)
Basophils Absolute: 0.2 10*3/uL — ABNORMAL HIGH (ref 0.0–0.1)
Basophils Relative: 2 %
Eosinophils Absolute: 0.6 10*3/uL — ABNORMAL HIGH (ref 0.0–0.5)
Eosinophils Relative: 5 %
HCT: 59.7 % — ABNORMAL HIGH (ref 39.0–52.0)
Hemoglobin: 17.9 g/dL — ABNORMAL HIGH (ref 13.0–17.0)
Immature Granulocytes: 1 %
Lymphocytes Relative: 10 %
Lymphs Abs: 1.3 10*3/uL (ref 0.7–4.0)
MCH: 27.9 pg (ref 26.0–34.0)
MCHC: 30 g/dL (ref 30.0–36.0)
MCV: 93 fL (ref 80.0–100.0)
Monocytes Absolute: 0.7 10*3/uL (ref 0.1–1.0)
Monocytes Relative: 6 %
Neutro Abs: 9.4 10*3/uL — ABNORMAL HIGH (ref 1.7–7.7)
Neutrophils Relative %: 76 %
Platelets: 794 10*3/uL — ABNORMAL HIGH (ref 150–400)
RBC: 6.42 MIL/uL — ABNORMAL HIGH (ref 4.22–5.81)
RDW: 20.4 % — ABNORMAL HIGH (ref 11.5–15.5)
WBC: 12.3 10*3/uL — ABNORMAL HIGH (ref 4.0–10.5)
nRBC: 0 % (ref 0.0–0.2)

## 2024-06-18 LAB — COMPREHENSIVE METABOLIC PANEL WITH GFR
ALT: 17 U/L (ref 0–44)
AST: 18 U/L (ref 15–41)
Albumin: 3.5 g/dL (ref 3.5–5.0)
Alkaline Phosphatase: 86 U/L (ref 38–126)
Anion gap: 13 (ref 5–15)
BUN: 12 mg/dL (ref 8–23)
CO2: 20 mmol/L — ABNORMAL LOW (ref 22–32)
Calcium: 9.3 mg/dL (ref 8.9–10.3)
Chloride: 105 mmol/L (ref 98–111)
Creatinine, Ser: 0.74 mg/dL (ref 0.61–1.24)
GFR, Estimated: >60 mL/min (ref >60)
Glucose, Bld: 97 mg/dL (ref 70–99)
Potassium: 4.1 mmol/L (ref 3.5–5.1)
Sodium: 138 mmol/L (ref 135–145)
Total Bilirubin: 0.9 mg/dL (ref 0.0–1.2)
Total Protein: 7.7 g/dL (ref 6.5–8.1)

## 2024-06-18 MED ORDER — HYDROXYUREA 500 MG PO CAPS: 1000.0000 mg | ORAL_CAPSULE | Freq: Every day | ORAL | 3 refills | Status: DC

## 2024-06-18 NOTE — Patient Instructions (Addendum)
 [facility] at [facility] Discharge Instructions   You were seen and examined today by Dr. Rogers.  He reviewed the results of your lab work which are mostly normal/stable. Your blood counts have gone up. Dr. MARLA wants you to temporarily increase the Hydrea  to two pills daily (1000 mg) for now.   We will see you back in 4 weeks. We will repeat lab work prior to this visit.   Return as scheduled.    Thank you for choosing [facility] at [facility] to provide your oncology and hematology care.  To afford each patient quality time with our provider, please arrive at least 15 minutes before your scheduled appointment time.   If you have a lab appointment with the Cancer Center please come in thru the Main Entrance and check in at the main information desk.  You need to re-schedule your appointment should you arrive 10 or more minutes late.  We strive to give you quality time with our providers, and arriving late affects you and other patients whose appointments are after yours.  Also, if you no show three or more times for appointments you may be dismissed from the clinic at the providers discretion.     Again, thank you for choosing [facility].  Our hope is that these requests will decrease the amount of time that you wait before being seen by our physicians.       _____________________________________________________________  Should you have questions after your visit to [facility], please contact our office at [phone] and follow the prompts.  Our office hours are 8:00 a.m. and 4:30 p.m. Monday - Friday.  Please note that voicemails left after 4:00 p.m. may not be returned until the following business day.  We are closed weekends and major holidays.  You do have access to a nurse 24-7, just call the main number to the clinic [phone] and do not press any options, hold on the line and a nurse will answer the  phone.    For prescription refill requests, have your pharmacy contact our office and allow 72 hours.    Due to Covid, you will need to wear a mask upon entering the hospital. If you do not have a mask, a mask will be given to you at the Main Entrance upon arrival. For doctor visits, patients may have 1 support person age 66 or older with them. For treatment visits, patients can not have anyone with them due to social distancing guidelines and our immunocompromised population.
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
## 2024-06-28 DIAGNOSIS — M549 Dorsalgia, unspecified: Secondary | ICD-10-CM | POA: Diagnosis not present

## 2024-06-28 DIAGNOSIS — F411 Generalized anxiety disorder: Secondary | ICD-10-CM | POA: Diagnosis not present

## 2024-06-28 DIAGNOSIS — S61412D Laceration without foreign body of left hand, subsequent encounter: Secondary | ICD-10-CM | POA: Diagnosis not present

## 2024-06-28 DIAGNOSIS — Z86718 Personal history of other venous thrombosis and embolism: Secondary | ICD-10-CM | POA: Diagnosis not present

## 2024-06-28 DIAGNOSIS — N390 Urinary tract infection, site not specified: Secondary | ICD-10-CM | POA: Diagnosis not present

## 2024-06-28 DIAGNOSIS — I2699 Other pulmonary embolism without acute cor pulmonale: Secondary | ICD-10-CM | POA: Diagnosis not present

## 2024-06-28 DIAGNOSIS — Z48812 Encounter for surgical aftercare following surgery on the circulatory system: Secondary | ICD-10-CM | POA: Diagnosis not present

## 2024-06-28 DIAGNOSIS — T24111D Burn of first degree of right thigh, subsequent encounter: Secondary | ICD-10-CM | POA: Diagnosis not present

## 2024-06-28 DIAGNOSIS — G603 Idiopathic progressive neuropathy: Secondary | ICD-10-CM | POA: Diagnosis not present

## 2024-06-28 DIAGNOSIS — Z7901 Long term (current) use of anticoagulants: Secondary | ICD-10-CM | POA: Diagnosis not present

## 2024-06-28 DIAGNOSIS — A419 Sepsis, unspecified organism: Secondary | ICD-10-CM | POA: Diagnosis not present

## 2024-06-28 DIAGNOSIS — Z6835 Body mass index (BMI) 35.0-35.9, adult: Secondary | ICD-10-CM | POA: Diagnosis not present

## 2024-06-28 DIAGNOSIS — I2510 Atherosclerotic heart disease of native coronary artery without angina pectoris: Secondary | ICD-10-CM | POA: Diagnosis not present

## 2024-06-28 DIAGNOSIS — G8929 Other chronic pain: Secondary | ICD-10-CM | POA: Diagnosis not present

## 2024-06-28 DIAGNOSIS — I4510 Unspecified right bundle-branch block: Secondary | ICD-10-CM | POA: Diagnosis not present

## 2024-06-28 DIAGNOSIS — I110 Hypertensive heart disease with heart failure: Secondary | ICD-10-CM | POA: Diagnosis not present

## 2024-06-28 DIAGNOSIS — D649 Anemia, unspecified: Secondary | ICD-10-CM | POA: Diagnosis not present

## 2024-06-28 DIAGNOSIS — K219 Gastro-esophageal reflux disease without esophagitis: Secondary | ICD-10-CM | POA: Diagnosis not present

## 2024-06-28 DIAGNOSIS — Z7902 Long term (current) use of antithrombotics/antiplatelets: Secondary | ICD-10-CM | POA: Diagnosis not present

## 2024-06-28 DIAGNOSIS — B965 Pseudomonas (aeruginosa) (mallei) (pseudomallei) as the cause of diseases classified elsewhere: Secondary | ICD-10-CM | POA: Diagnosis not present

## 2024-06-28 DIAGNOSIS — G4733 Obstructive sleep apnea (adult) (pediatric): Secondary | ICD-10-CM | POA: Diagnosis not present

## 2024-06-28 DIAGNOSIS — G8220 Paraplegia, unspecified: Secondary | ICD-10-CM | POA: Diagnosis not present

## 2024-06-28 DIAGNOSIS — E785 Hyperlipidemia, unspecified: Secondary | ICD-10-CM | POA: Diagnosis not present

## 2024-06-28 DIAGNOSIS — I5022 Chronic systolic (congestive) heart failure: Secondary | ICD-10-CM | POA: Diagnosis not present

## 2024-07-03 DIAGNOSIS — N390 Urinary tract infection, site not specified: Secondary | ICD-10-CM | POA: Diagnosis not present

## 2024-07-03 DIAGNOSIS — I2699 Other pulmonary embolism without acute cor pulmonale: Secondary | ICD-10-CM | POA: Diagnosis not present

## 2024-07-03 DIAGNOSIS — T24111D Burn of first degree of right thigh, subsequent encounter: Secondary | ICD-10-CM | POA: Diagnosis not present

## 2024-07-03 DIAGNOSIS — B965 Pseudomonas (aeruginosa) (mallei) (pseudomallei) as the cause of diseases classified elsewhere: Secondary | ICD-10-CM | POA: Diagnosis not present

## 2024-07-03 DIAGNOSIS — Z48812 Encounter for surgical aftercare following surgery on the circulatory system: Secondary | ICD-10-CM | POA: Diagnosis not present

## 2024-07-03 DIAGNOSIS — A419 Sepsis, unspecified organism: Secondary | ICD-10-CM | POA: Diagnosis not present

## 2024-07-03 DIAGNOSIS — I2601 Septic pulmonary embolism with acute cor pulmonale: Secondary | ICD-10-CM | POA: Diagnosis not present

## 2024-07-03 DIAGNOSIS — I5022 Chronic systolic (congestive) heart failure: Secondary | ICD-10-CM | POA: Diagnosis not present

## 2024-07-03 DIAGNOSIS — D471 Chronic myeloproliferative disease: Secondary | ICD-10-CM | POA: Diagnosis not present

## 2024-07-04 DIAGNOSIS — N390 Urinary tract infection, site not specified: Secondary | ICD-10-CM | POA: Diagnosis not present

## 2024-07-04 DIAGNOSIS — T24311A Burn of third degree of right thigh, initial encounter: Secondary | ICD-10-CM | POA: Diagnosis not present

## 2024-07-04 DIAGNOSIS — B965 Pseudomonas (aeruginosa) (mallei) (pseudomallei) as the cause of diseases classified elsewhere: Secondary | ICD-10-CM | POA: Diagnosis not present

## 2024-07-04 DIAGNOSIS — T24311S Burn of third degree of right thigh, sequela: Secondary | ICD-10-CM | POA: Diagnosis not present

## 2024-07-04 DIAGNOSIS — Z48812 Encounter for surgical aftercare following surgery on the circulatory system: Secondary | ICD-10-CM | POA: Diagnosis not present

## 2024-07-04 DIAGNOSIS — T24111D Burn of first degree of right thigh, subsequent encounter: Secondary | ICD-10-CM | POA: Diagnosis not present

## 2024-07-04 DIAGNOSIS — A419 Sepsis, unspecified organism: Secondary | ICD-10-CM | POA: Diagnosis not present

## 2024-07-04 DIAGNOSIS — I2699 Other pulmonary embolism without acute cor pulmonale: Secondary | ICD-10-CM | POA: Diagnosis not present

## 2024-07-06 DIAGNOSIS — T24111D Burn of first degree of right thigh, subsequent encounter: Secondary | ICD-10-CM | POA: Diagnosis not present

## 2024-07-06 DIAGNOSIS — N390 Urinary tract infection, site not specified: Secondary | ICD-10-CM | POA: Diagnosis not present

## 2024-07-06 DIAGNOSIS — B965 Pseudomonas (aeruginosa) (mallei) (pseudomallei) as the cause of diseases classified elsewhere: Secondary | ICD-10-CM | POA: Diagnosis not present

## 2024-07-06 DIAGNOSIS — Z48812 Encounter for surgical aftercare following surgery on the circulatory system: Secondary | ICD-10-CM | POA: Diagnosis not present

## 2024-07-06 DIAGNOSIS — I2699 Other pulmonary embolism without acute cor pulmonale: Secondary | ICD-10-CM | POA: Diagnosis not present

## 2024-07-06 DIAGNOSIS — A419 Sepsis, unspecified organism: Secondary | ICD-10-CM | POA: Diagnosis not present

## 2024-07-09 DIAGNOSIS — T24111D Burn of first degree of right thigh, subsequent encounter: Secondary | ICD-10-CM | POA: Diagnosis not present

## 2024-07-09 DIAGNOSIS — B965 Pseudomonas (aeruginosa) (mallei) (pseudomallei) as the cause of diseases classified elsewhere: Secondary | ICD-10-CM | POA: Diagnosis not present

## 2024-07-09 DIAGNOSIS — A419 Sepsis, unspecified organism: Secondary | ICD-10-CM | POA: Diagnosis not present

## 2024-07-09 DIAGNOSIS — I2699 Other pulmonary embolism without acute cor pulmonale: Secondary | ICD-10-CM | POA: Diagnosis not present

## 2024-07-09 DIAGNOSIS — Z48812 Encounter for surgical aftercare following surgery on the circulatory system: Secondary | ICD-10-CM | POA: Diagnosis not present

## 2024-07-09 DIAGNOSIS — N390 Urinary tract infection, site not specified: Secondary | ICD-10-CM | POA: Diagnosis not present

## 2024-07-11 ENCOUNTER — Other Ambulatory Visit: Payer: Self-pay | Admitting: Urology

## 2024-07-11 ENCOUNTER — Other Ambulatory Visit: Payer: Self-pay

## 2024-07-11 ENCOUNTER — Telehealth: Payer: Self-pay | Admitting: Urology

## 2024-07-11 ENCOUNTER — Other Ambulatory Visit

## 2024-07-11 DIAGNOSIS — R300 Dysuria: Secondary | ICD-10-CM | POA: Diagnosis not present

## 2024-07-11 DIAGNOSIS — R8290 Unspecified abnormal findings in urine: Secondary | ICD-10-CM

## 2024-07-11 DIAGNOSIS — R399 Unspecified symptoms and signs involving the genitourinary system: Secondary | ICD-10-CM

## 2024-07-11 LAB — URINALYSIS, ROUTINE W REFLEX MICROSCOPIC
Bilirubin, UA: NEGATIVE
Glucose, UA: NEGATIVE
Ketones, UA: NEGATIVE
Nitrite, UA: POSITIVE — AB
RBC, UA: NEGATIVE
Specific Gravity, UA: 1.025 (ref 1.005–1.030)
Urobilinogen, Ur: 4 mg/dL — ABNORMAL HIGH (ref 0.2–1.0)
pH, UA: 6 (ref 5.0–7.5)

## 2024-07-11 LAB — MICROSCOPIC EXAMINATION: WBC, UA: >30 /HPF — AB (ref 0–5)

## 2024-07-11 MED ORDER — DOXYCYCLINE HYCLATE 100 MG PO CAPS: 100.0000 mg | ORAL_CAPSULE | Freq: Two times a day (BID) | ORAL | 0 refills | Status: DC

## 2024-07-11 NOTE — Telephone Encounter (Signed)
 Please review ua

## 2024-07-11 NOTE — Telephone Encounter (Signed)
 Patient is aware of NP's response and voiced understanding.

## 2024-07-11 NOTE — Telephone Encounter (Signed)
 Was septic and having recurrent UTI, now he has pain with urination  Dysuria  Patient called with c/o dysuria x 1 week days.  Pain: sharp  Severity:8/10  Associated Signs and Symptoms:  Fever: noTemp. Chills: no Hematuria: no Urgency: yes Frequency: yes Hesitancy:yes Incontinence: no Nausea: no Vomiting: no  Message sent to clinic staff to return call to patient to advise of plan.

## 2024-07-11 NOTE — Telephone Encounter (Signed)
 Patient added to lab schedule for urine drop off

## 2024-07-12 DIAGNOSIS — Z48812 Encounter for surgical aftercare following surgery on the circulatory system: Secondary | ICD-10-CM | POA: Diagnosis not present

## 2024-07-12 DIAGNOSIS — N390 Urinary tract infection, site not specified: Secondary | ICD-10-CM | POA: Diagnosis not present

## 2024-07-12 DIAGNOSIS — A419 Sepsis, unspecified organism: Secondary | ICD-10-CM | POA: Diagnosis not present

## 2024-07-12 DIAGNOSIS — B965 Pseudomonas (aeruginosa) (mallei) (pseudomallei) as the cause of diseases classified elsewhere: Secondary | ICD-10-CM | POA: Diagnosis not present

## 2024-07-12 DIAGNOSIS — I2699 Other pulmonary embolism without acute cor pulmonale: Secondary | ICD-10-CM | POA: Diagnosis not present

## 2024-07-12 DIAGNOSIS — T24111D Burn of first degree of right thigh, subsequent encounter: Secondary | ICD-10-CM | POA: Diagnosis not present

## 2024-07-13 ENCOUNTER — Ambulatory Visit: Payer: Self-pay | Admitting: Urology

## 2024-07-13 LAB — URINE CULTURE

## 2024-07-16 DIAGNOSIS — A419 Sepsis, unspecified organism: Secondary | ICD-10-CM | POA: Diagnosis not present

## 2024-07-16 DIAGNOSIS — Z48812 Encounter for surgical aftercare following surgery on the circulatory system: Secondary | ICD-10-CM | POA: Diagnosis not present

## 2024-07-16 DIAGNOSIS — T24111D Burn of first degree of right thigh, subsequent encounter: Secondary | ICD-10-CM | POA: Diagnosis not present

## 2024-07-16 DIAGNOSIS — B965 Pseudomonas (aeruginosa) (mallei) (pseudomallei) as the cause of diseases classified elsewhere: Secondary | ICD-10-CM | POA: Diagnosis not present

## 2024-07-16 DIAGNOSIS — I2699 Other pulmonary embolism without acute cor pulmonale: Secondary | ICD-10-CM | POA: Diagnosis not present

## 2024-07-16 DIAGNOSIS — N390 Urinary tract infection, site not specified: Secondary | ICD-10-CM | POA: Diagnosis not present

## 2024-07-17 ENCOUNTER — Inpatient Hospital Stay: Attending: Hematology | Admitting: Hematology

## 2024-07-17 ENCOUNTER — Inpatient Hospital Stay

## 2024-07-17 VITALS — BP 126/62 | HR 41 | Temp 98.2°F | Resp 16

## 2024-07-17 DIAGNOSIS — D471 Chronic myeloproliferative disease: Secondary | ICD-10-CM

## 2024-07-17 DIAGNOSIS — Z8546 Personal history of malignant neoplasm of prostate: Secondary | ICD-10-CM | POA: Diagnosis not present

## 2024-07-17 DIAGNOSIS — Z801 Family history of malignant neoplasm of trachea, bronchus and lung: Secondary | ICD-10-CM | POA: Diagnosis not present

## 2024-07-17 DIAGNOSIS — Z87891 Personal history of nicotine dependence: Secondary | ICD-10-CM | POA: Insufficient documentation

## 2024-07-17 DIAGNOSIS — Z8042 Family history of malignant neoplasm of prostate: Secondary | ICD-10-CM | POA: Diagnosis not present

## 2024-07-17 DIAGNOSIS — I2692 Saddle embolus of pulmonary artery without acute cor pulmonale: Secondary | ICD-10-CM | POA: Diagnosis not present

## 2024-07-17 DIAGNOSIS — Z7901 Long term (current) use of anticoagulants: Secondary | ICD-10-CM | POA: Insufficient documentation

## 2024-07-17 DIAGNOSIS — Z803 Family history of malignant neoplasm of breast: Secondary | ICD-10-CM | POA: Insufficient documentation

## 2024-07-17 DIAGNOSIS — Z86718 Personal history of other venous thrombosis and embolism: Secondary | ICD-10-CM | POA: Insufficient documentation

## 2024-07-17 DIAGNOSIS — Z800 Family history of malignant neoplasm of digestive organs: Secondary | ICD-10-CM | POA: Diagnosis not present

## 2024-07-17 LAB — CBC WITH DIFFERENTIAL/PLATELET
Abs Immature Granulocytes: 0.08 K/uL — ABNORMAL HIGH (ref 0.00–0.07)
Basophils Absolute: 0.1 K/uL (ref 0.0–0.1)
Basophils Relative: 1 %
Eosinophils Absolute: 0.5 K/uL (ref 0.0–0.5)
Eosinophils Relative: 5 %
HCT: 54.8 % — ABNORMAL HIGH (ref 39.0–52.0)
Hemoglobin: 16.2 g/dL (ref 13.0–17.0)
Immature Granulocytes: 1 %
Lymphocytes Relative: 12 %
Lymphs Abs: 1.2 K/uL (ref 0.7–4.0)
MCH: 28.9 pg (ref 26.0–34.0)
MCHC: 29.6 g/dL — ABNORMAL LOW (ref 30.0–36.0)
MCV: 97.9 fL (ref 80.0–100.0)
Monocytes Absolute: 0.7 K/uL (ref 0.1–1.0)
Monocytes Relative: 7 %
Neutro Abs: 7.5 K/uL (ref 1.7–7.7)
Neutrophils Relative %: 74 %
Platelets: 452 K/uL — ABNORMAL HIGH (ref 150–400)
RBC: 5.6 MIL/uL (ref 4.22–5.81)
RDW: 21.1 % — ABNORMAL HIGH (ref 11.5–15.5)
Smear Review: NORMAL
WBC: 10.1 K/uL (ref 4.0–10.5)
nRBC: 0 % (ref 0.0–0.2)

## 2024-07-17 LAB — COMPREHENSIVE METABOLIC PANEL WITH GFR
ALT: 13 U/L (ref 0–44)
AST: 15 U/L (ref 15–41)
Albumin: 3.2 g/dL — ABNORMAL LOW (ref 3.5–5.0)
Alkaline Phosphatase: 80 U/L (ref 38–126)
Anion gap: 8 (ref 5–15)
BUN: 8 mg/dL (ref 8–23)
CO2: 25 mmol/L (ref 22–32)
Calcium: 8.9 mg/dL (ref 8.9–10.3)
Chloride: 107 mmol/L (ref 98–111)
Creatinine, Ser: 0.84 mg/dL (ref 0.61–1.24)
GFR, Estimated: >60 mL/min (ref >60)
Glucose, Bld: 91 mg/dL (ref 70–99)
Potassium: 3.3 mmol/L — ABNORMAL LOW (ref 3.5–5.1)
Sodium: 140 mmol/L (ref 135–145)
Total Bilirubin: 0.9 mg/dL (ref 0.0–1.2)
Total Protein: 6.9 g/dL (ref 6.5–8.1)
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
## 2024-07-17 NOTE — Patient Instructions (Addendum)
 [facility] at [facility] Discharge Instructions   You were seen and examined today by Dr. Rogers.  He reviewed the results of your lab work which are normal/stable. Dr. MARLA would like to increase your Hydrea  to 3 pills a day on Monday, Wednesday, and Friday. All other days stick to 2 pills a day  We will see you back in 6 weeks. We will repeat lab work at that time.    Return as scheduled.    Thank you for choosing  Cancer Center at [facility] to provide your oncology and hematology care.  To afford each patient quality time with our provider, please arrive at least 15 minutes before your scheduled appointment time.   If you have a lab appointment with the Cancer Center please come in thru the Main Entrance and check in at the main information desk.  You need to re-schedule your appointment should you arrive 10 or more minutes late.  We strive to give you quality time with our providers, and arriving late affects you and other patients whose appointments are after yours.  Also, if you no show three or more times for appointments you may be dismissed from the clinic at the providers discretion.     Again, thank you for choosing [facility].  Our hope is that these requests will decrease the amount of time that you wait before being seen by our physicians.       _____________________________________________________________  Should you have questions after your visit to [facility], please contact our office at [phone] and follow the prompts.  Our office hours are 8:00 a.m. and 4:30 p.m. Monday - Friday.  Please note that voicemails left after 4:00 p.m. may not be returned until the following business day.  We are closed weekends and major holidays.  You do have access to a nurse 24-7, just call the main number to the clinic [phone] and do not press any options, hold on the line and a nurse will answer the phone.     For prescription refill requests, have your pharmacy contact our office and allow 72 hours.    Due to Covid, you will need to wear a mask upon entering the hospital. If you do not have a mask, a mask will be given to you at the Main Entrance upon arrival. For doctor visits, patients may have 1 support person age 11 or older with them. For treatment visits, patients can not have anyone with them due to social distancing guidelines and our immunocompromised population.

## 2024-07-18 DIAGNOSIS — T25321D Burn of third degree of right foot, subsequent encounter: Secondary | ICD-10-CM | POA: Diagnosis not present

## 2024-07-18 DIAGNOSIS — I5020 Unspecified systolic (congestive) heart failure: Secondary | ICD-10-CM | POA: Diagnosis not present

## 2024-07-18 DIAGNOSIS — L97519 Non-pressure chronic ulcer of other part of right foot with unspecified severity: Secondary | ICD-10-CM | POA: Diagnosis not present

## 2024-07-18 DIAGNOSIS — T25311D Burn of third degree of right ankle, subsequent encounter: Secondary | ICD-10-CM | POA: Diagnosis not present

## 2024-07-18 DIAGNOSIS — T24311S Burn of third degree of right thigh, sequela: Secondary | ICD-10-CM | POA: Diagnosis not present

## 2024-07-18 DIAGNOSIS — T24311D Burn of third degree of right thigh, subsequent encounter: Secondary | ICD-10-CM | POA: Diagnosis not present

## 2024-07-18 DIAGNOSIS — T310 Burns involving less than 10% of body surface: Secondary | ICD-10-CM | POA: Diagnosis not present

## 2024-07-19 DIAGNOSIS — A419 Sepsis, unspecified organism: Secondary | ICD-10-CM | POA: Diagnosis not present

## 2024-07-19 DIAGNOSIS — I2699 Other pulmonary embolism without acute cor pulmonale: Secondary | ICD-10-CM | POA: Diagnosis not present

## 2024-07-19 DIAGNOSIS — N390 Urinary tract infection, site not specified: Secondary | ICD-10-CM | POA: Diagnosis not present

## 2024-07-19 DIAGNOSIS — B965 Pseudomonas (aeruginosa) (mallei) (pseudomallei) as the cause of diseases classified elsewhere: Secondary | ICD-10-CM | POA: Diagnosis not present

## 2024-07-19 DIAGNOSIS — Z48812 Encounter for surgical aftercare following surgery on the circulatory system: Secondary | ICD-10-CM | POA: Diagnosis not present

## 2024-07-19 DIAGNOSIS — T24111D Burn of first degree of right thigh, subsequent encounter: Secondary | ICD-10-CM | POA: Diagnosis not present

## 2024-07-24 ENCOUNTER — Ambulatory Visit (INDEPENDENT_AMBULATORY_CARE_PROVIDER_SITE_OTHER): Admitting: Podiatry

## 2024-07-24 DIAGNOSIS — B353 Tinea pedis: Secondary | ICD-10-CM | POA: Diagnosis not present

## 2024-07-24 MED ORDER — KETOCONAZOLE 2 % EX CREA: 1.0000 | TOPICAL_CREAM | Freq: Two times a day (BID) | CUTANEOUS | 6 refills | Status: DC

## 2024-07-24 NOTE — Progress Notes (Signed)
 Patient presents for athlete's foot between her toes and around the toes bilaterally.  Is been bothering her for several months now.  Is wheelchair-bound.  He has swelling in both legs secondary to the DVTs.   Physical exam:  General appearance: Pleasant, and in no acute distress. AOx3.  Vascular: Pedal pulses: DP 2/4 bilaterally, PT 0 UTA care/4 bilaterally.  Severe edema lower legs bilaterally. Capillary fill time immediate bilaterally.  Neurological: Light touch intact feet bilaterally.  Normal Achilles reflex bilaterally.  No clonus or spasticity noted.   Dermatologic:   Maceration in the interdigital spaces with a red erythematous rash around the toes 1 through 5 bilaterally skin normal temperature bilaterally.  Some skin atrophy feet bilaterally.  Musculoskeletal: Severe hammertoes 1 through 5 bilaterally.  Decreased muscle strength lower legs bilaterally    Diagnosis: 1.  Tinea pedis bilaterally  Plan: -New patient office visit level 3 for evaluation and management. -Discussed the tinea pedis and the fungal origins.  Recommended noncotton socks for wicking ability and bruisability.  Patient typically does not wear socks.  Recommended to do this as we will cut down on the recurrence of the tinea.  Discussed how his hammertoes and the contractures of the toes contributing to this. -Rx ketoconazole  cream, apply twice daily to affected areas feet, 6 refills - Using ketoconazole  for 6 to 8 weeks should be resolved by then if not he should call for an appointment  Return as needed

## 2024-07-26 DIAGNOSIS — A419 Sepsis, unspecified organism: Secondary | ICD-10-CM | POA: Diagnosis not present

## 2024-07-26 DIAGNOSIS — B965 Pseudomonas (aeruginosa) (mallei) (pseudomallei) as the cause of diseases classified elsewhere: Secondary | ICD-10-CM | POA: Diagnosis not present

## 2024-07-26 DIAGNOSIS — N390 Urinary tract infection, site not specified: Secondary | ICD-10-CM | POA: Diagnosis not present

## 2024-07-26 DIAGNOSIS — I2699 Other pulmonary embolism without acute cor pulmonale: Secondary | ICD-10-CM | POA: Diagnosis not present

## 2024-07-26 DIAGNOSIS — Z48812 Encounter for surgical aftercare following surgery on the circulatory system: Secondary | ICD-10-CM | POA: Diagnosis not present

## 2024-07-26 DIAGNOSIS — T24111D Burn of first degree of right thigh, subsequent encounter: Secondary | ICD-10-CM | POA: Diagnosis not present

## 2024-07-28 DIAGNOSIS — Z6835 Body mass index (BMI) 35.0-35.9, adult: Secondary | ICD-10-CM | POA: Diagnosis not present

## 2024-07-28 DIAGNOSIS — Z86718 Personal history of other venous thrombosis and embolism: Secondary | ICD-10-CM | POA: Diagnosis not present

## 2024-07-28 DIAGNOSIS — I2699 Other pulmonary embolism without acute cor pulmonale: Secondary | ICD-10-CM | POA: Diagnosis not present

## 2024-07-28 DIAGNOSIS — G603 Idiopathic progressive neuropathy: Secondary | ICD-10-CM | POA: Diagnosis not present

## 2024-07-28 DIAGNOSIS — K219 Gastro-esophageal reflux disease without esophagitis: Secondary | ICD-10-CM | POA: Diagnosis not present

## 2024-07-28 DIAGNOSIS — G8220 Paraplegia, unspecified: Secondary | ICD-10-CM | POA: Diagnosis not present

## 2024-07-28 DIAGNOSIS — G4733 Obstructive sleep apnea (adult) (pediatric): Secondary | ICD-10-CM | POA: Diagnosis not present

## 2024-07-28 DIAGNOSIS — D649 Anemia, unspecified: Secondary | ICD-10-CM | POA: Diagnosis not present

## 2024-07-28 DIAGNOSIS — Z7902 Long term (current) use of antithrombotics/antiplatelets: Secondary | ICD-10-CM | POA: Diagnosis not present

## 2024-07-28 DIAGNOSIS — Z48812 Encounter for surgical aftercare following surgery on the circulatory system: Secondary | ICD-10-CM | POA: Diagnosis not present

## 2024-07-28 DIAGNOSIS — A419 Sepsis, unspecified organism: Secondary | ICD-10-CM | POA: Diagnosis not present

## 2024-07-28 DIAGNOSIS — F411 Generalized anxiety disorder: Secondary | ICD-10-CM | POA: Diagnosis not present

## 2024-07-28 DIAGNOSIS — I110 Hypertensive heart disease with heart failure: Secondary | ICD-10-CM | POA: Diagnosis not present

## 2024-07-28 DIAGNOSIS — E785 Hyperlipidemia, unspecified: Secondary | ICD-10-CM | POA: Diagnosis not present

## 2024-07-28 DIAGNOSIS — Z7901 Long term (current) use of anticoagulants: Secondary | ICD-10-CM | POA: Diagnosis not present

## 2024-07-28 DIAGNOSIS — I2510 Atherosclerotic heart disease of native coronary artery without angina pectoris: Secondary | ICD-10-CM | POA: Diagnosis not present

## 2024-07-28 DIAGNOSIS — G8929 Other chronic pain: Secondary | ICD-10-CM | POA: Diagnosis not present

## 2024-07-28 DIAGNOSIS — I4510 Unspecified right bundle-branch block: Secondary | ICD-10-CM | POA: Diagnosis not present

## 2024-07-28 DIAGNOSIS — N390 Urinary tract infection, site not specified: Secondary | ICD-10-CM | POA: Diagnosis not present

## 2024-07-28 DIAGNOSIS — M549 Dorsalgia, unspecified: Secondary | ICD-10-CM | POA: Diagnosis not present

## 2024-07-28 DIAGNOSIS — S61412D Laceration without foreign body of left hand, subsequent encounter: Secondary | ICD-10-CM | POA: Diagnosis not present

## 2024-07-28 DIAGNOSIS — I5022 Chronic systolic (congestive) heart failure: Secondary | ICD-10-CM | POA: Diagnosis not present

## 2024-07-28 DIAGNOSIS — T24111D Burn of first degree of right thigh, subsequent encounter: Secondary | ICD-10-CM | POA: Diagnosis not present

## 2024-07-28 DIAGNOSIS — B965 Pseudomonas (aeruginosa) (mallei) (pseudomallei) as the cause of diseases classified elsewhere: Secondary | ICD-10-CM | POA: Diagnosis not present

## 2024-07-31 DIAGNOSIS — I2699 Other pulmonary embolism without acute cor pulmonale: Secondary | ICD-10-CM | POA: Diagnosis not present

## 2024-07-31 DIAGNOSIS — Z48812 Encounter for surgical aftercare following surgery on the circulatory system: Secondary | ICD-10-CM | POA: Diagnosis not present

## 2024-07-31 DIAGNOSIS — A419 Sepsis, unspecified organism: Secondary | ICD-10-CM | POA: Diagnosis not present

## 2024-07-31 DIAGNOSIS — B965 Pseudomonas (aeruginosa) (mallei) (pseudomallei) as the cause of diseases classified elsewhere: Secondary | ICD-10-CM | POA: Diagnosis not present

## 2024-07-31 DIAGNOSIS — N390 Urinary tract infection, site not specified: Secondary | ICD-10-CM | POA: Diagnosis not present

## 2024-07-31 DIAGNOSIS — T24111D Burn of first degree of right thigh, subsequent encounter: Secondary | ICD-10-CM | POA: Diagnosis not present

## 2024-08-01 ENCOUNTER — Other Ambulatory Visit: Payer: Self-pay | Admitting: *Deleted

## 2024-08-01 DIAGNOSIS — A419 Sepsis, unspecified organism: Secondary | ICD-10-CM | POA: Diagnosis not present

## 2024-08-01 DIAGNOSIS — T24111D Burn of first degree of right thigh, subsequent encounter: Secondary | ICD-10-CM | POA: Diagnosis not present

## 2024-08-01 DIAGNOSIS — I2699 Other pulmonary embolism without acute cor pulmonale: Secondary | ICD-10-CM | POA: Diagnosis not present

## 2024-08-01 DIAGNOSIS — N390 Urinary tract infection, site not specified: Secondary | ICD-10-CM | POA: Diagnosis not present

## 2024-08-01 DIAGNOSIS — B965 Pseudomonas (aeruginosa) (mallei) (pseudomallei) as the cause of diseases classified elsewhere: Secondary | ICD-10-CM | POA: Diagnosis not present

## 2024-08-01 DIAGNOSIS — Z48812 Encounter for surgical aftercare following surgery on the circulatory system: Secondary | ICD-10-CM | POA: Diagnosis not present

## 2024-08-01 MED ORDER — HYDROXYUREA 500 MG PO CAPS: ORAL_CAPSULE | ORAL | 0 refills | Status: DC

## 2024-08-03 ENCOUNTER — Ambulatory Visit: Attending: Nurse Practitioner | Admitting: Nurse Practitioner

## 2024-08-03 ENCOUNTER — Encounter: Payer: Self-pay | Admitting: Nurse Practitioner

## 2024-08-03 VITALS — BP 142/84 | HR 67 | Ht 75.0 in | Wt 251.0 lb

## 2024-08-03 DIAGNOSIS — I2510 Atherosclerotic heart disease of native coronary artery without angina pectoris: Secondary | ICD-10-CM | POA: Diagnosis not present

## 2024-08-03 DIAGNOSIS — Z79899 Other long term (current) drug therapy: Secondary | ICD-10-CM | POA: Insufficient documentation

## 2024-08-03 DIAGNOSIS — Z8673 Personal history of transient ischemic attack (TIA), and cerebral infarction without residual deficits: Secondary | ICD-10-CM | POA: Diagnosis not present

## 2024-08-03 DIAGNOSIS — I10 Essential (primary) hypertension: Secondary | ICD-10-CM | POA: Diagnosis not present

## 2024-08-03 DIAGNOSIS — E876 Hypokalemia: Secondary | ICD-10-CM | POA: Insufficient documentation

## 2024-08-03 DIAGNOSIS — I824Z3 Acute embolism and thrombosis of unspecified deep veins of distal lower extremity, bilateral: Secondary | ICD-10-CM | POA: Diagnosis not present

## 2024-08-03 DIAGNOSIS — I5189 Other ill-defined heart diseases: Secondary | ICD-10-CM | POA: Diagnosis not present

## 2024-08-03 DIAGNOSIS — I493 Ventricular premature depolarization: Secondary | ICD-10-CM | POA: Insufficient documentation

## 2024-08-03 DIAGNOSIS — I491 Atrial premature depolarization: Secondary | ICD-10-CM | POA: Diagnosis not present

## 2024-08-03 DIAGNOSIS — G4733 Obstructive sleep apnea (adult) (pediatric): Secondary | ICD-10-CM | POA: Insufficient documentation

## 2024-08-03 DIAGNOSIS — I2602 Saddle embolus of pulmonary artery with acute cor pulmonale: Secondary | ICD-10-CM | POA: Diagnosis not present

## 2024-08-03 DIAGNOSIS — I5022 Chronic systolic (congestive) heart failure: Secondary | ICD-10-CM | POA: Diagnosis not present

## 2024-08-03 MED ORDER — POTASSIUM CHLORIDE CRYS ER 20 MEQ PO TBCR: EXTENDED_RELEASE_TABLET | ORAL | 3 refills | Status: AC

## 2024-08-03 NOTE — Progress Notes (Unsigned)
 Cardiology Office Note:  .   Date:  08/03/2024 ID:  Cody [facility]elez, DOB 1946-12-18, MRN [id number] PCP: Sheryle Carwin, MD  [facility] Providers Cardiologist:  Jayson Sierras, MD    History of Present Illness: .   Cody Velez is a 78 y.o. male with a PMH of HFrEF, HTN, CAD, frequent PVC's, hx of CVA, and OSA on CPAP, who presents today for 32-month follow-up appointment.  Heart healthy diet and regular cardiovascular exercise encouraged.  TTE revealed EF 30 to 35%, global hypokinesis, felt to be NICM.  Last seen by Dr. Sierras on December 08, 2023.  At the time, was seeing [facility] cardiology.  Patient mention his preference to continue with [facility] for his cardiac needs.  Noted difficult time weighing at home but noted improvement with leg swelling while taking Lasix  daily.   TTE in March 2025 revealed EF 35 to 40%, global hypokinesis of left ventricle, grade 1 DD, no significant valvular abnormalities.  04/27/2024 - Today he presents for follow-up.  He states he is doing well. Denies any acute cardiac complaints or issues. Denies any chest pain, shortness of breath, palpitations, syncope, presyncope, dizziness, orthopnea, PND, swelling or significant weight changes, acute bleeding, or claudication.  He was hospitalized in June 2025 in [place] , was visiting family, when he presentsed to [facility] ED for evaluation of AMS, hypotension and presented with sepsis/septic shock. EKG on arrival showed ST, RBBB, with prolonged QTC. CXR revelaed indeterminate masslike opacity at medial base of right hemithorax, wide mediastinum and cardiomegaly and a tortuous aorta.  He was admitted to the ICU.  Venous Doppler revealed bilateral acute DVT on the right leg within the common femoral, femoral and popliteal veins and on the left within the popliteal and posterior tibial venous segment.  He was started on heparin , IVC filter was placed due to his immobility and high risk for  hypercoagulability.  After patient was transferred from ICU, he continued to complain of shortness of breath and chest pains.  CTA of chest showed large saddle embolus with extension into the lobar and segmental branches of the right and left lungs, with evidence of heart strain.  Echocardiogram revealed EF 45%, moderately dilated RV and moderate RV systolic dysfunction.  He was transferred back to the ICU, IR consulted for thrombectomy.  Underwent IVC filter retrieval, thrombectomy, and IVC filter placement.  He was eventually transitioned to oral Eliquis.  ID was following patient for Pseudomonas UTI.  Patient was to follow-up with primary oncologist and have repeat echo.  He was transferred to [facility] for rehab in [place]. Now following Hem/Onc at [facility] and diagnosed with JAK2 positive MPN.   Today he presents for scheduled follow-up. Patient and wife update me as to most recent hospital stay and rehab stay as noted above. He is doing well. Denies any chest pain, shortness of breath, palpitations, syncope, presyncope, dizziness, orthopnea, PND, significant weight changes, acute bleeding, or claudication. Swelling is well controlled and wearing bilateral compression stockings to BLE. Tolerating his medications well. Now doing physical therapy.   ROS: Negative. See HPI.  SH: Education administrator, former [employer].   Studies Reviewed: SABRA    EKG: EKG is not ordered today.      Echo 05/2024 [facility]):  Interpretation Summary  A two-dimensional transthoracic echocardiogram with color flow and Doppler was performed in limited views only.  The study was technically difficult with many images being suboptimal in quality.  Images were  not obtained from all of the standard acoustic windows due to the limited scope of the study.  LVEF is grossly mildly depressed in the 45% region but with limited images that are extremely poor quality.  The right ventricle is moderately dilated.   The right ventricular systolic function is moderately reduced.  No clear evidence of pericardial effusion based on limited window ( no subcostal views).  Echo 02/2024:  1. Left ventricular ejection fraction, by estimation, is 35 to 40%. The  left ventricle has moderately decreased function. The left ventricle  demonstrates global hypokinesis. Left ventricular diastolic parameters are consistent with Grade I diastolic dysfunction (impaired relaxation). The average left ventricular global longitudinal strain is -11.6 %. The global longitudinal strain is abnormal.   2. Right ventricular systolic function is normal. The right ventricular  size is normal. Tricuspid regurgitation signal is inadequate for assessing  PA pressure.   3. The mitral valve is normal in structure. No evidence of mitral valve  regurgitation. No evidence of mitral stenosis.   4. The aortic valve is tricuspid. Aortic valve regurgitation is not  visualized. No aortic stenosis is present.   5. The inferior vena cava is normal in size with greater than 50%  respiratory variability, suggesting right atrial pressure of 3 mmHg.   Comparison(s): A prior study was performed on 10/03/2023. TDS-EF 30-35%. Moderate LVH.  Cardiac monitor 08/2023:  ZIO XT reviewed.  2 days, 23 hours analyzed.   Predominant rhythm is sinus with heart rate ranging from 47 bpm up to 103 bpm and average heart rate 65 bpm. There were frequent PACs representing approximately 39% total beats with otherwise occasional atrial couplets and rare atrial triplets. There were occasional PVCs representing approximately 3% total beats with otherwise rare ventricular couplets and triplets as well as limited episodes of ventricular bigeminy and trigeminy. Two brief episodes of NSVT were noted, the longest of which was only 4 beats. There were also 5 episodes of PSVT, the longest of which was only 8 beats. No sustained arrhythmias, pauses, or heart block noted.  TEE  03/2018:  Study Conclusions   - Procedure narrative: Transesophageal echocardiography. Topical    anesthesia was obtained using viscous lidocaine . A    transesophageal probe was inserted by the attending cardiologist.    Image quality was poor. This was a technically difficult study.  - Left ventricle: Systolic function was normal. The estimated    ejection fraction was in the range of 55% to 60%.  - Descending aorta: The descending aorta had moderate diffuse    disease.  - Mitral valve: There was trivial regurgitation.  - Left atrium: No evidence of thrombus in the atrial cavity or    appendage.  - Tricuspid valve: There was trivial regurgitation.  - Pulmonic valve: There was trivial regurgitation.   Impressions:   - Images were overall limited. Valves best visualized with    nonstandard views. There were no obvious valvular vegetations.  Lexiscan  02/2018:  There was no ST segment deviation noted during stress. The study is normal. This is a low risk study. The left ventricular ejection fraction is normal (55-65%).   Normal resting and stress perfusion.   No RWMA;s EF 57%   Physical Exam:   VS:  BP (!) 142/84 (BP Location: Left Arm)   Pulse 67   Ht 6' 3 (1.905 m)   Wt 251 lb (113.9 kg)   SpO2 96%   BMI 31.37 kg/m    Wt Readings from Last 3 Encounters:  08/03/24 251 lb (113.9 kg)  04/27/24 270 lb (122.5 kg)  02/21/24 270 lb (122.5 kg)    GEN: Obese, 78 y.o. male in no acute distress NECK: No JVD; No carotid bruits CARDIAC: S1/S2, irregular rhythm, no murmurs, rubs, gallops RESPIRATORY:  Clear to auscultation without rales, wheezing or rhonchi  ABDOMEN: Soft, non-tender, non-distended EXTREMITIES:  No edema; No deformity   ASSESSMENT AND PLAN: .    HFmrEF, RV systolic dysfunction, medication management Stage C, NYHA class I-II symptoms. EF 45%, 05/2024, improved from previous study. Euvolemic and well compensated on exam.  Continue current medication regimen. Will  begin potassium 20 mEq daily x 5 days and then reduce to take potassium anytime he takes Lasix . Low sodium diet, fluid restriction <2L, and daily weights encouraged. Educated to contact our office for weight gain of 2 lbs overnight or 5 lbs in one week. Continue PT. Plan to update Echo once he has been on OAC within the next 3-6 months.   2. Acute saddle PE with heart strain/ acute bilateral DVT's Doing well post IVC filter placement and thrombectomy - see details in HPI. Denies any chest pain or shortness of breath. Continue Eliquis and continue to follow-up with Hem/Onc. Care and ED precautions discussed.   3. CAD Heart catheterization in 2015 showed mild nonobstructive CAD. Stable with no anginal symptoms. No indication for ischemic evaluation. Continue current medication regimen. Heart healthy diet and regular cardiovascular exercise encouraged.   4. HTN BP borderline elevated. Discussed to monitor BP at home at least 2 hours after medications and sitting for 5-10 minutes. Goal SBP < 140.  No medication changes at this time.  Heart healthy diet and regular cardiovascular exercise encouraged.   5. Frequent PVC's and PAC's  He is asymptomatic with this.  Continue Toprol-XL. Heart healthy diet and regular cardiovascular exercise encouraged.   6. OSA on CPAP Encouraged continued compliance.  7. Hx of CVA Denies any issues.  Continue Plavix . Continue to follow with PCP.  8. Low blood potassium Most recent K+ was 3.3.  Instructed to start potassium supplement -potassium chloride  20 mill equivalents daily x 5 days, then reduce to take potassium anytime he takes Lasix -see above.  Will obtain BMET in 1 week.    Dispo: Follow-up with Dr. Debera or APP in 3 months or sooner if anything changes.   Signed, Almarie Crate, NP

## 2024-08-03 NOTE — Patient Instructions (Addendum)
 Medication Instructions:  Your physician has recommended you make the following change in your medication:  Please start potassium 20 mEq daily x 5 days and reduce  to take potassium anytime he takes his Lasix .   Labwork: In 1 week with [facility]   Testing/Procedures: None   Follow-Up: Your physician recommends that you schedule a follow-up appointment in: 3 Months   Any Other Special Instructions Will Be Listed Below (If Applicable).  If you need a refill on your cardiac medications before your next appointment, please call your pharmacy.

## 2024-08-06 DIAGNOSIS — N390 Urinary tract infection, site not specified: Secondary | ICD-10-CM | POA: Diagnosis not present

## 2024-08-06 DIAGNOSIS — I2699 Other pulmonary embolism without acute cor pulmonale: Secondary | ICD-10-CM | POA: Diagnosis not present

## 2024-08-06 DIAGNOSIS — T24111D Burn of first degree of right thigh, subsequent encounter: Secondary | ICD-10-CM | POA: Diagnosis not present

## 2024-08-06 DIAGNOSIS — B965 Pseudomonas (aeruginosa) (mallei) (pseudomallei) as the cause of diseases classified elsewhere: Secondary | ICD-10-CM | POA: Diagnosis not present

## 2024-08-06 DIAGNOSIS — A419 Sepsis, unspecified organism: Secondary | ICD-10-CM | POA: Diagnosis not present

## 2024-08-06 DIAGNOSIS — Z48812 Encounter for surgical aftercare following surgery on the circulatory system: Secondary | ICD-10-CM | POA: Diagnosis not present

## 2024-08-08 DIAGNOSIS — Z48812 Encounter for surgical aftercare following surgery on the circulatory system: Secondary | ICD-10-CM | POA: Diagnosis not present

## 2024-08-08 DIAGNOSIS — N390 Urinary tract infection, site not specified: Secondary | ICD-10-CM | POA: Diagnosis not present

## 2024-08-08 DIAGNOSIS — I2699 Other pulmonary embolism without acute cor pulmonale: Secondary | ICD-10-CM | POA: Diagnosis not present

## 2024-08-08 DIAGNOSIS — B965 Pseudomonas (aeruginosa) (mallei) (pseudomallei) as the cause of diseases classified elsewhere: Secondary | ICD-10-CM | POA: Diagnosis not present

## 2024-08-08 DIAGNOSIS — T24111D Burn of first degree of right thigh, subsequent encounter: Secondary | ICD-10-CM | POA: Diagnosis not present

## 2024-08-08 DIAGNOSIS — A419 Sepsis, unspecified organism: Secondary | ICD-10-CM | POA: Diagnosis not present

## 2024-08-09 DIAGNOSIS — R519 Headache, unspecified: Secondary | ICD-10-CM | POA: Diagnosis not present

## 2024-08-09 DIAGNOSIS — Z48812 Encounter for surgical aftercare following surgery on the circulatory system: Secondary | ICD-10-CM | POA: Diagnosis not present

## 2024-08-09 DIAGNOSIS — D75839 Thrombocytosis, unspecified: Secondary | ICD-10-CM | POA: Diagnosis not present

## 2024-08-09 DIAGNOSIS — T24111D Burn of first degree of right thigh, subsequent encounter: Secondary | ICD-10-CM | POA: Diagnosis not present

## 2024-08-09 DIAGNOSIS — N390 Urinary tract infection, site not specified: Secondary | ICD-10-CM | POA: Diagnosis not present

## 2024-08-09 DIAGNOSIS — I2699 Other pulmonary embolism without acute cor pulmonale: Secondary | ICD-10-CM | POA: Diagnosis not present

## 2024-08-09 DIAGNOSIS — B965 Pseudomonas (aeruginosa) (mallei) (pseudomallei) as the cause of diseases classified elsewhere: Secondary | ICD-10-CM | POA: Diagnosis not present

## 2024-08-09 DIAGNOSIS — A419 Sepsis, unspecified organism: Secondary | ICD-10-CM | POA: Diagnosis not present

## 2024-08-09 DIAGNOSIS — I5022 Chronic systolic (congestive) heart failure: Secondary | ICD-10-CM | POA: Diagnosis not present

## 2024-08-09 DIAGNOSIS — G9340 Encephalopathy, unspecified: Secondary | ICD-10-CM | POA: Diagnosis not present

## 2024-08-10 DIAGNOSIS — R531 Weakness: Secondary | ICD-10-CM | POA: Diagnosis not present

## 2024-08-10 DIAGNOSIS — W19XXXA Unspecified fall, initial encounter: Secondary | ICD-10-CM | POA: Diagnosis not present

## 2024-08-10 DIAGNOSIS — S0990XA Unspecified injury of head, initial encounter: Secondary | ICD-10-CM | POA: Diagnosis not present

## 2024-08-13 DIAGNOSIS — T24111D Burn of first degree of right thigh, subsequent encounter: Secondary | ICD-10-CM | POA: Diagnosis not present

## 2024-08-13 DIAGNOSIS — Z48812 Encounter for surgical aftercare following surgery on the circulatory system: Secondary | ICD-10-CM | POA: Diagnosis not present

## 2024-08-13 DIAGNOSIS — N390 Urinary tract infection, site not specified: Secondary | ICD-10-CM | POA: Diagnosis not present

## 2024-08-13 DIAGNOSIS — B965 Pseudomonas (aeruginosa) (mallei) (pseudomallei) as the cause of diseases classified elsewhere: Secondary | ICD-10-CM | POA: Diagnosis not present

## 2024-08-13 DIAGNOSIS — A419 Sepsis, unspecified organism: Secondary | ICD-10-CM | POA: Diagnosis not present

## 2024-08-13 DIAGNOSIS — I2699 Other pulmonary embolism without acute cor pulmonale: Secondary | ICD-10-CM | POA: Diagnosis not present

## 2024-08-15 DIAGNOSIS — T24111D Burn of first degree of right thigh, subsequent encounter: Secondary | ICD-10-CM | POA: Diagnosis not present

## 2024-08-15 DIAGNOSIS — Z48812 Encounter for surgical aftercare following surgery on the circulatory system: Secondary | ICD-10-CM | POA: Diagnosis not present

## 2024-08-15 DIAGNOSIS — N390 Urinary tract infection, site not specified: Secondary | ICD-10-CM | POA: Diagnosis not present

## 2024-08-15 DIAGNOSIS — I2699 Other pulmonary embolism without acute cor pulmonale: Secondary | ICD-10-CM | POA: Diagnosis not present

## 2024-08-15 DIAGNOSIS — A419 Sepsis, unspecified organism: Secondary | ICD-10-CM | POA: Diagnosis not present

## 2024-08-15 DIAGNOSIS — B965 Pseudomonas (aeruginosa) (mallei) (pseudomallei) as the cause of diseases classified elsewhere: Secondary | ICD-10-CM | POA: Diagnosis not present

## 2024-08-16 DIAGNOSIS — I10 Essential (primary) hypertension: Secondary | ICD-10-CM | POA: Diagnosis not present

## 2024-08-16 DIAGNOSIS — I5020 Unspecified systolic (congestive) heart failure: Secondary | ICD-10-CM | POA: Diagnosis not present

## 2024-08-16 DIAGNOSIS — N390 Urinary tract infection, site not specified: Secondary | ICD-10-CM | POA: Diagnosis not present

## 2024-08-16 DIAGNOSIS — I2602 Saddle embolus of pulmonary artery with acute cor pulmonale: Secondary | ICD-10-CM | POA: Diagnosis not present

## 2024-08-16 DIAGNOSIS — D45 Polycythemia vera: Secondary | ICD-10-CM | POA: Diagnosis not present

## 2024-08-16 DIAGNOSIS — I824Z3 Acute embolism and thrombosis of unspecified deep veins of distal lower extremity, bilateral: Secondary | ICD-10-CM | POA: Diagnosis not present

## 2024-08-16 DIAGNOSIS — Z79899 Other long term (current) drug therapy: Secondary | ICD-10-CM | POA: Diagnosis not present

## 2024-08-17 ENCOUNTER — Ambulatory Visit (HOSPITAL_COMMUNITY)
Admission: RE | Admit: 2024-08-17 | Discharge: 2024-08-17 | Disposition: A | Discharge status: Home / Self Care | Source: Ambulatory Visit | Attending: Urology | Admitting: Urology

## 2024-08-17 ENCOUNTER — Encounter: Payer: Self-pay | Admitting: Internal Medicine

## 2024-08-17 DIAGNOSIS — N200 Calculus of kidney: Secondary | ICD-10-CM | POA: Diagnosis present

## 2024-08-17 DIAGNOSIS — N2889 Other specified disorders of kidney and ureter: Secondary | ICD-10-CM | POA: Diagnosis present

## 2024-08-17 DIAGNOSIS — N390 Urinary tract infection, site not specified: Secondary | ICD-10-CM | POA: Diagnosis not present

## 2024-08-22 DIAGNOSIS — T24311S Burn of third degree of right thigh, sequela: Secondary | ICD-10-CM | POA: Diagnosis not present

## 2024-08-22 DIAGNOSIS — I5020 Unspecified systolic (congestive) heart failure: Secondary | ICD-10-CM | POA: Diagnosis not present

## 2024-08-22 DIAGNOSIS — T25321A Burn of third degree of right foot, initial encounter: Secondary | ICD-10-CM | POA: Diagnosis not present

## 2024-08-22 DIAGNOSIS — T24311A Burn of third degree of right thigh, initial encounter: Secondary | ICD-10-CM | POA: Diagnosis not present

## 2024-08-22 DIAGNOSIS — T25311A Burn of third degree of right ankle, initial encounter: Secondary | ICD-10-CM | POA: Diagnosis not present

## 2024-08-22 DIAGNOSIS — T25311D Burn of third degree of right ankle, subsequent encounter: Secondary | ICD-10-CM | POA: Diagnosis not present

## 2024-08-24 DIAGNOSIS — N390 Urinary tract infection, site not specified: Secondary | ICD-10-CM | POA: Diagnosis not present

## 2024-08-24 DIAGNOSIS — Z48812 Encounter for surgical aftercare following surgery on the circulatory system: Secondary | ICD-10-CM | POA: Diagnosis not present

## 2024-08-24 DIAGNOSIS — T24111D Burn of first degree of right thigh, subsequent encounter: Secondary | ICD-10-CM | POA: Diagnosis not present

## 2024-08-24 DIAGNOSIS — I2699 Other pulmonary embolism without acute cor pulmonale: Secondary | ICD-10-CM | POA: Diagnosis not present

## 2024-08-24 DIAGNOSIS — A419 Sepsis, unspecified organism: Secondary | ICD-10-CM | POA: Diagnosis not present

## 2024-08-24 DIAGNOSIS — B965 Pseudomonas (aeruginosa) (mallei) (pseudomallei) as the cause of diseases classified elsewhere: Secondary | ICD-10-CM | POA: Diagnosis not present

## 2024-08-25 DIAGNOSIS — T24111D Burn of first degree of right thigh, subsequent encounter: Secondary | ICD-10-CM | POA: Diagnosis not present

## 2024-08-25 DIAGNOSIS — I2699 Other pulmonary embolism without acute cor pulmonale: Secondary | ICD-10-CM | POA: Diagnosis not present

## 2024-08-25 DIAGNOSIS — Z48812 Encounter for surgical aftercare following surgery on the circulatory system: Secondary | ICD-10-CM | POA: Diagnosis not present

## 2024-08-25 DIAGNOSIS — B965 Pseudomonas (aeruginosa) (mallei) (pseudomallei) as the cause of diseases classified elsewhere: Secondary | ICD-10-CM | POA: Diagnosis not present

## 2024-08-25 DIAGNOSIS — A419 Sepsis, unspecified organism: Secondary | ICD-10-CM | POA: Diagnosis not present

## 2024-08-25 DIAGNOSIS — N390 Urinary tract infection, site not specified: Secondary | ICD-10-CM | POA: Diagnosis not present

## 2024-08-27 DIAGNOSIS — Z6835 Body mass index (BMI) 35.0-35.9, adult: Secondary | ICD-10-CM | POA: Diagnosis not present

## 2024-08-27 DIAGNOSIS — G8220 Paraplegia, unspecified: Secondary | ICD-10-CM | POA: Diagnosis not present

## 2024-08-27 DIAGNOSIS — M549 Dorsalgia, unspecified: Secondary | ICD-10-CM | POA: Diagnosis not present

## 2024-08-27 DIAGNOSIS — I2699 Other pulmonary embolism without acute cor pulmonale: Secondary | ICD-10-CM | POA: Diagnosis not present

## 2024-08-27 DIAGNOSIS — F411 Generalized anxiety disorder: Secondary | ICD-10-CM | POA: Diagnosis not present

## 2024-08-27 DIAGNOSIS — D649 Anemia, unspecified: Secondary | ICD-10-CM | POA: Diagnosis not present

## 2024-08-27 DIAGNOSIS — Z87891 Personal history of nicotine dependence: Secondary | ICD-10-CM | POA: Diagnosis not present

## 2024-08-27 DIAGNOSIS — Z48812 Encounter for surgical aftercare following surgery on the circulatory system: Secondary | ICD-10-CM | POA: Diagnosis not present

## 2024-08-27 DIAGNOSIS — Z7902 Long term (current) use of antithrombotics/antiplatelets: Secondary | ICD-10-CM | POA: Diagnosis not present

## 2024-08-27 DIAGNOSIS — I2510 Atherosclerotic heart disease of native coronary artery without angina pectoris: Secondary | ICD-10-CM | POA: Diagnosis not present

## 2024-08-27 DIAGNOSIS — T24111D Burn of first degree of right thigh, subsequent encounter: Secondary | ICD-10-CM | POA: Diagnosis not present

## 2024-08-27 DIAGNOSIS — G8929 Other chronic pain: Secondary | ICD-10-CM | POA: Diagnosis not present

## 2024-08-27 DIAGNOSIS — G603 Idiopathic progressive neuropathy: Secondary | ICD-10-CM | POA: Diagnosis not present

## 2024-08-27 DIAGNOSIS — Z86718 Personal history of other venous thrombosis and embolism: Secondary | ICD-10-CM | POA: Diagnosis not present

## 2024-08-27 DIAGNOSIS — I110 Hypertensive heart disease with heart failure: Secondary | ICD-10-CM | POA: Diagnosis not present

## 2024-08-27 DIAGNOSIS — G4733 Obstructive sleep apnea (adult) (pediatric): Secondary | ICD-10-CM | POA: Diagnosis not present

## 2024-08-27 DIAGNOSIS — E785 Hyperlipidemia, unspecified: Secondary | ICD-10-CM | POA: Diagnosis not present

## 2024-08-27 DIAGNOSIS — Z8673 Personal history of transient ischemic attack (TIA), and cerebral infarction without residual deficits: Secondary | ICD-10-CM | POA: Diagnosis not present

## 2024-08-27 DIAGNOSIS — Z7901 Long term (current) use of anticoagulants: Secondary | ICD-10-CM | POA: Diagnosis not present

## 2024-08-27 DIAGNOSIS — K219 Gastro-esophageal reflux disease without esophagitis: Secondary | ICD-10-CM | POA: Diagnosis not present

## 2024-08-27 DIAGNOSIS — I5022 Chronic systolic (congestive) heart failure: Secondary | ICD-10-CM | POA: Diagnosis not present

## 2024-08-27 DIAGNOSIS — I4510 Unspecified right bundle-branch block: Secondary | ICD-10-CM | POA: Diagnosis not present

## 2024-08-28 ENCOUNTER — Inpatient Hospital Stay

## 2024-08-28 ENCOUNTER — Inpatient Hospital Stay: Attending: Hematology | Admitting: Oncology

## 2024-08-28 ENCOUNTER — Other Ambulatory Visit: Payer: Self-pay | Admitting: Cardiology

## 2024-08-28 VITALS — BP 88/72 | HR 82 | Temp 98.0°F | Resp 16

## 2024-08-28 DIAGNOSIS — Z1589 Genetic susceptibility to other disease: Secondary | ICD-10-CM | POA: Insufficient documentation

## 2024-08-28 DIAGNOSIS — I2692 Saddle embolus of pulmonary artery without acute cor pulmonale: Secondary | ICD-10-CM

## 2024-08-28 DIAGNOSIS — I2699 Other pulmonary embolism without acute cor pulmonale: Secondary | ICD-10-CM | POA: Insufficient documentation

## 2024-08-28 DIAGNOSIS — D471 Chronic myeloproliferative disease: Secondary | ICD-10-CM | POA: Insufficient documentation

## 2024-08-28 LAB — COMPREHENSIVE METABOLIC PANEL WITH GFR
ALT: 12 U/L (ref 0–44)
AST: 16 U/L (ref 15–41)
Albumin: 3.5 g/dL (ref 3.5–5.0)
Alkaline Phosphatase: 73 U/L (ref 38–126)
Anion gap: 10 (ref 5–15)
BUN: 10 mg/dL (ref 8–23)
CO2: 25 mmol/L (ref 22–32)
Calcium: 9 mg/dL (ref 8.9–10.3)
Chloride: 104 mmol/L (ref 98–111)
Creatinine, Ser: 1.03 mg/dL (ref 0.61–1.24)
GFR, Estimated: >60 mL/min (ref >60)
Glucose, Bld: 102 mg/dL — ABNORMAL HIGH (ref 70–99)
Potassium: 4.2 mmol/L (ref 3.5–5.1)
Sodium: 139 mmol/L (ref 135–145)
Total Bilirubin: 0.6 mg/dL (ref 0.0–1.2)
Total Protein: 7.2 g/dL (ref 6.5–8.1)

## 2024-08-28 LAB — CBC WITH DIFFERENTIAL/PLATELET
Abs Immature Granulocytes: 0.07 K/uL (ref 0.00–0.07)
Basophils Absolute: 0.1 K/uL (ref 0.0–0.1)
Basophils Relative: 1 %
Eosinophils Absolute: 0.4 K/uL (ref 0.0–0.5)
Eosinophils Relative: 3 %
HCT: 52.2 % — ABNORMAL HIGH (ref 39.0–52.0)
Hemoglobin: 16.1 g/dL (ref 13.0–17.0)
Immature Granulocytes: 1 %
Lymphocytes Relative: 9 %
Lymphs Abs: 1 K/uL (ref 0.7–4.0)
MCH: 33.6 pg (ref 26.0–34.0)
MCHC: 30.8 g/dL (ref 30.0–36.0)
MCV: 109 fL — ABNORMAL HIGH (ref 80.0–100.0)
Monocytes Absolute: 0.6 K/uL (ref 0.1–1.0)
Monocytes Relative: 6 %
Neutro Abs: 9 K/uL — ABNORMAL HIGH (ref 1.7–7.7)
Neutrophils Relative %: 80 %
Platelets: 428 K/uL — ABNORMAL HIGH (ref 150–400)
RBC: 4.79 MIL/uL (ref 4.22–5.81)
RDW: 22.2 % — ABNORMAL HIGH (ref 11.5–15.5)
WBC: 11.1 K/uL — ABNORMAL HIGH (ref 4.0–10.5)
nRBC: 0 % (ref 0.0–0.2)

## 2024-08-28 LAB — LACTATE DEHYDROGENASE: LDH: 146 U/L (ref 98–192)

## 2024-08-28 MED ORDER — HYDROXYUREA 500 MG PO CAPS: ORAL_CAPSULE | ORAL | 0 refills | Status: AC

## 2024-08-28 NOTE — Progress Notes (Signed)
 [facility] OFFICE PROGRESS NOTE  Sheryle Carwin, MD  ASSESSMENT & PLAN:    Assessment & Plan JAK-2 gene mutation - At last visit, hydroxyurea  was increased to 1500 mg 3 times per week and 1 g 4 days/week. - Reports overall doing well since returning home from rehab. - He is tolerating Hydrea  very well.  No GI side effects. - Labs today: Normal LFTs and creatinine.  CBC shows slight elevation in white blood cell count which is more or less baseline for patient.  Platelet count also improved to 428 from 452.  Hematocrit is 52.2 from 54.8. - We discussed continuing increasing hydroxyurea  to 1500 mg x 4 days/week and 1000 mg 3 days/week.  Target platelet count less than 400, hematocrit less than 45 and normal white count. - RTC 10 weeks for follow-up with repeat labs. Acute saddle pulmonary embolism without acute cor pulmonale (HCC) - He was admitted to [facility] in [place]  from 05/29/2024 through 06/07/2021 with septic shock and was also diagnosed with saddle embolism.  He underwent thrombectomy and IVC filter placement.   - He is tolerating Eliquis daily very well.  He is also on Plavix . - No bleeding issues reported.  Because of limited mobility, I have recommended indefinite anticoagulation.  No orders of the defined types were placed in this encounter.   INTERVAL HISTORY: Patient returns for follow-up for thrombocytosis.  He is currently tolerating and taking hydroxyurea  1500 mg 3 days/week and 1000 mg 4 days/week.   Today, he states that he is doing well overall. His appetite level is at 100%. His energy level is at 50%. He is accompanied by his wife.  Reports he is slowly improving from his recent hospitalization.  He has been using his incentive spirometer and doing more at home.  He has signed up for physical therapy which she will start soon.   Denies any recent infections.  He is still on a blood thinner and and denies any bleeding, easy bruising or  bright red blood per rectum.   Weight has more or less stabilized.  Appetite has improved.  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  We reviewed CBC with differential.   SUMMARY OF HEMATOLOGIC HISTORY: Oncology History   No history exists.    1.  JAK2 positive MPN: - Patient seen at the request of Lauraine Oz, FNP - Intermittently elevated platelet count since 2019. - Denies aqua genic pruritus or erythromelalgia's. - Gives history of DVT in the legs in 2019, reportedly treated with 6 months of anticoagulation.  I could not find records. - Mother had polycythemia vera. - JAK2 V6 40F (02/21/2024): Positive, 58.4% - Hydroxyurea  500 mg daily started on 03/13/2024, dose reduced to 1 tablet every other day on 04/11/2024   2. Social/Family History: -Retired from [employer]. 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 Had a spinal stimulator implanted in 2019 that was infected later that year, making him wheelchair bound. Quit smoking after [place]. Chews tobacco. Agent orange exposure.  -Mother had polycythemia vera requiring chemotherapy and phlebotomy, as well as colon cancer. Father had lung cancer. Brother had prostate cancer. Paternal cousin died of prostate cancer. Maternal grandfather had cancer, type unknown.   3.  Prostate cancer: - TRUS/biopsy (02/2015): 2 cores at the left apex revealed relatively low volume GS 3+3 pattern.  PSA 4.1.  Oncotype DX GPS score 11, favorable/low risk prostate cancer.  Less than 5% chance of  having high-grade disease/extraprostatic disease.  He was maintained on observation.   4.  Saddle pulmonary embolism/bilateral DVT: - CT angiogram (06/01/2024): Large saddle embolus with extension into lobar and segmental branches of both lungs with sign of right heart strain.  Echocardiogram with LVEF 45%, moderately dilated RV and moderate RV systolic dysfunction. - S/p IVC filter retrieval, thrombectomy and  IVC filter placement on 06/01/2024. - Started on Eliquis.   CBC    Component Value Date/Time   WBC 11.1 (H) 08/28/2024 1317   RBC 4.79 08/28/2024 1317   HGB 16.1 08/28/2024 1317   HGB 14.2 03/14/2020 1557   HCT 52.2 (H) 08/28/2024 1317   HCT 42.7 03/14/2020 1557   PLT 428 (H) 08/28/2024 1317   PLT 340 03/14/2020 1557   MCV 109.0 (H) 08/28/2024 1317   MCV 84 03/14/2020 1557   MCH 33.6 08/28/2024 1317   MCHC 30.8 08/28/2024 1317   RDW 22.2 (H) 08/28/2024 1317   RDW 14.0 03/14/2020 1557   LYMPHSABS 1.0 08/28/2024 1317   LYMPHSABS 1.4 03/14/2020 1557   MONOABS 0.6 08/28/2024 1317   EOSABS 0.4 08/28/2024 1317   EOSABS 0.3 03/14/2020 1557   BASOSABS 0.1 08/28/2024 1317   BASOSABS 0.1 03/14/2020 1557       Latest Ref Rng & Units 08/28/2024    1:17 PM 07/17/2024    9:26 AM 06/18/2024    1:45 PM  CMP  Glucose 70 - 99 mg/dL 897  91  97   BUN 8 - 23 mg/dL 10  8  12    Creatinine 0.61 - 1.24 mg/dL 8.96  9.15  9.25   Sodium 135 - 145 mmol/L 139  140  138   Potassium 3.5 - 5.1 mmol/L 4.2  3.3  4.1   Chloride 98 - 111 mmol/L 104  107  105   CO2 22 - 32 mmol/L 25  25  20    Calcium  8.9 - 10.3 mg/dL 9.0  8.9  9.3   Total Protein 6.5 - 8.1 g/dL 7.2  6.9  7.7   Total Bilirubin 0.0 - 1.2 mg/dL 0.6  0.9  0.9   Alkaline Phos 38 - 126 U/L 73  80  86   AST 15 - 41 U/L 16  15  18    ALT 0 - 44 U/L 12  13  17       Lab Results  Component Value Date   FERRITIN 13 (L) 04/11/2024   VITAMINB12 794 05/09/2018    Vitals:   08/28/24 1407  BP: (!) 88/72  Pulse: 82  Resp: 16  Temp: 98 F (36.7 C)  SpO2: 98%    Review of System:  Review of Systems  Constitutional:  Positive for malaise/fatigue.  Neurological:  Positive for headaches.    Physical Exam: Physical Exam Constitutional:      Appearance: Normal appearance.  HENT:     Head: Normocephalic and atraumatic.  Eyes:     Pupils: Pupils are equal, round, and reactive to light.  Cardiovascular:     Rate and Rhythm: Normal rate  and regular rhythm.     Heart sounds: Normal heart sounds. No murmur heard. Pulmonary:     Effort: Pulmonary effort is normal.     Breath sounds: Normal breath sounds. No wheezing.  Abdominal:     General: Bowel sounds are normal. There is no distension.     Palpations: Abdomen is soft.     Tenderness: There is no abdominal tenderness.  Musculoskeletal:  General: Normal range of motion.     Cervical back: Normal range of motion.  Skin:    General: Skin is warm and dry.     Findings: No rash.  Neurological:     Mental Status: He is alert and oriented to person, place, and time.     Gait: Gait is intact.  Psychiatric:        Mood and Affect: Mood and affect normal.        Cognition and Memory: Memory normal.        Judgment: Judgment normal.      I spent 20 minutes dedicated to the care of this patient (face-to-face and non-face-to-face) on the date of the encounter to include what is described in the assessment and plan.,  Delon Hope, NP 08/28/2024 6:45 PM

## 2024-08-28 NOTE — Telephone Encounter (Signed)
 Eliquis 5mg  refill request received. Patient is 78 years old, weight-113.9kg, Crea-1.03 on 08/28/24, Diagnosis-PE & DVT, and last seen by Almarie Crate on 08/03/24. Dose is appropriate based on dosing criteria.  Since on this for PE & DVT will need to see if okay to send since not cardiac indication.

## 2024-08-28 NOTE — Assessment & Plan Note (Addendum)
-   He was admitted to [facility] in [place]  from 05/29/2024 through 06/07/2021 with septic shock and was also diagnosed with saddle embolism.  He underwent thrombectomy and IVC filter placement.   - He is tolerating Eliquis daily very well.  He is also on Plavix . - No bleeding issues reported.  Because of limited mobility, I have recommended indefinite anticoagulation.

## 2024-08-28 NOTE — Assessment & Plan Note (Addendum)
-   At last visit, hydroxyurea  was increased to 1500 mg 3 times per week and 1 g 4 days/week. - Reports overall doing well since returning home from rehab. - He is tolerating Hydrea  very well.  No GI side effects. - Labs today: Normal LFTs and creatinine.  CBC shows slight elevation in white blood cell count which is more or less baseline for patient.  Platelet count also improved to 428 from 452.  Hematocrit is 52.2 from 54.8. - We discussed continuing increasing hydroxyurea  to 1500 mg x 4 days/week and 1000 mg 3 days/week.  Target platelet count less than 400, hematocrit less than 45 and normal white count. - RTC 10 weeks for follow-up with repeat labs.

## 2024-08-29 ENCOUNTER — Ambulatory Visit: Admitting: Urology

## 2024-08-29 ENCOUNTER — Telehealth: Payer: Self-pay | Admitting: *Deleted

## 2024-08-29 DIAGNOSIS — T24111D Burn of first degree of right thigh, subsequent encounter: Secondary | ICD-10-CM | POA: Diagnosis not present

## 2024-08-29 DIAGNOSIS — I110 Hypertensive heart disease with heart failure: Secondary | ICD-10-CM | POA: Diagnosis not present

## 2024-08-29 DIAGNOSIS — I5022 Chronic systolic (congestive) heart failure: Secondary | ICD-10-CM | POA: Diagnosis not present

## 2024-08-29 DIAGNOSIS — Z48812 Encounter for surgical aftercare following surgery on the circulatory system: Secondary | ICD-10-CM | POA: Diagnosis not present

## 2024-08-29 DIAGNOSIS — G8220 Paraplegia, unspecified: Secondary | ICD-10-CM | POA: Diagnosis not present

## 2024-08-29 DIAGNOSIS — I2699 Other pulmonary embolism without acute cor pulmonale: Secondary | ICD-10-CM | POA: Diagnosis not present

## 2024-08-29 NOTE — Telephone Encounter (Signed)
 Ok to refill per FORBES Crate NP.  Refill sent to Pharmacy.

## 2024-08-29 NOTE — Telephone Encounter (Signed)
 Reached out to [place], patient's significant other to make her aware that Delon Hope, NP has decided to increase his hydroxyurea  to 1500 (3 tabs) 4 days/week and 1000 mg (2 tabs) 3 days/week.  Verbalized understanding and all questions answered.

## 2024-08-30 DIAGNOSIS — I110 Hypertensive heart disease with heart failure: Secondary | ICD-10-CM | POA: Diagnosis not present

## 2024-08-30 DIAGNOSIS — I5022 Chronic systolic (congestive) heart failure: Secondary | ICD-10-CM | POA: Diagnosis not present

## 2024-08-30 DIAGNOSIS — Z48812 Encounter for surgical aftercare following surgery on the circulatory system: Secondary | ICD-10-CM | POA: Diagnosis not present

## 2024-08-30 DIAGNOSIS — T24111D Burn of first degree of right thigh, subsequent encounter: Secondary | ICD-10-CM | POA: Diagnosis not present

## 2024-08-30 DIAGNOSIS — G8220 Paraplegia, unspecified: Secondary | ICD-10-CM | POA: Diagnosis not present

## 2024-08-30 DIAGNOSIS — I2699 Other pulmonary embolism without acute cor pulmonale: Secondary | ICD-10-CM | POA: Diagnosis not present

## 2024-08-31 DIAGNOSIS — I110 Hypertensive heart disease with heart failure: Secondary | ICD-10-CM | POA: Diagnosis not present

## 2024-08-31 DIAGNOSIS — I5022 Chronic systolic (congestive) heart failure: Secondary | ICD-10-CM | POA: Diagnosis not present

## 2024-08-31 DIAGNOSIS — G8220 Paraplegia, unspecified: Secondary | ICD-10-CM | POA: Diagnosis not present

## 2024-08-31 DIAGNOSIS — T24111D Burn of first degree of right thigh, subsequent encounter: Secondary | ICD-10-CM | POA: Diagnosis not present

## 2024-08-31 DIAGNOSIS — I2699 Other pulmonary embolism without acute cor pulmonale: Secondary | ICD-10-CM | POA: Diagnosis not present

## 2024-08-31 DIAGNOSIS — Z48812 Encounter for surgical aftercare following surgery on the circulatory system: Secondary | ICD-10-CM | POA: Diagnosis not present

## 2024-09-03 NOTE — Progress Notes (Signed)
 Impression/Assessment:  1.  Grade group 1 prostate cancer, diagnosed in 2016.  Very low volume.  On active surveillance.  MRI earlier this year revealed only PI-RADS category 1 appearance.  2.  BPH, on maximal medical therapy with stable urinary symptoms  3.  Rule out UTI  Plan:      History of Present Illness:   Elevated PSA:   TRUS/Bx March, 2016. At that time, 2 cores at the left apex revealed relatively low volume GS 3+3 pattern. Prostatic volume 65 ml. PSA 4.1. PSAD 0.06. Oncotype DX was performed at that time, as we anticipated active surveillance. This revealed a GPS score 11, favorable/low risk prostate cancer. Less than 5% chance of having high-grade disease/extraprostatic disease.  Repeat TRUS/Bx in February, 2017. Only one core at the left apex at that time revealed GS 3+3 , only 5% of core involved.   He was started on finasteride  on 4.25.2016.   2.5.2023: MRI prostate.  Volume 50 mL.  No extra prostatic abnormalities noted.  Prostate was normal with PI-RADS category 1 appearance.   12.12.2023: Most recent PSA last year 1.3.  He is still on finasteride  and alfuzosin .  His girlfriend says that his urine has been smelly.   9.16.2025: PSA earlier this year 1.1    ED:   6.16.2020: He first stated noticing pain on approximately 03/21/2011. His symptoms did begin gradually. His symptoms have been stable over the last year. He does not have difficulties achieving an erection. He does have problems maintaining his erections. His erections are straight. He does not have trouble reaching climax.   PDE 5 inhibitors have not worked. He is not currently on med Rx for ED.     9.15.2025:  Past Medical History:  Diagnosis Date   Allergic rhinitis    Arthritis    B12 deficiency    Cervicogenic headache    Childhood asthma    Chronic back pain    Coronary atherosclerosis of native coronary artery    Mild nonobstructive CAD at catheterization January 2015   Dementia [facility])     Depression    Essential hypertension    Falls    GERD (gastroesophageal reflux disease)    Glaucoma    History of blood transfusion    History of cerebrovascular disease 07/23/2015   History of kidney stones    History of pneumonia 02/2011   Hyperlipidemia    OSA (obstructive sleep apnea)    CPAP - Dr. Corrie - uses cpap every night   Pneumonia    Prostate cancer [facility])    PTSD (post-traumatic stress disorder)    [place]   Rectal bleeding    Stroke (HCC) 03/2021   Wheelchair bound     Past Surgical History:  Procedure Laterality Date   APPLICATION OF ROBOTIC ASSISTANCE FOR SPINAL PROCEDURE N/A 03/28/2018   Procedure: APPLICATION OF ROBOTIC ASSISTANCE FOR SPINAL PROCEDURE;  Surgeon: Colon Shove, MD;  Location: [facility] OR;  Service: Neurosurgery;  Laterality: N/A;   BACK SURGERY  02/14/2012   lumbar OR #7; today redid L1L2; replaced screws; added bone from hip   BILATERAL KNEE ARTHROSCOPY     COLONOSCOPY  10/15/2008   Dr. Shaaron: tubular adenoma    COLONOSCOPY  12/17/2003   MFM:Wnmfjo rectal and colon   COLONOSCOPY N/A 09/05/2014   tubular adenoma   COLONOSCOPY WITH PROPOFOL  N/A 09/15/2022   Multiple 3-25 mm polyps in ascending colon and cecum, markedly elongated and redundant colon. One polyp removed piecemeal fashion. 6 month surveillance recommended.  Tubular adenomas.   COLONOSCOPY WITH PROPOFOL  N/A 02/23/2023   Procedure: COLONOSCOPY WITH PROPOFOL ;  Surgeon: Shaaron Lamar HERO, MD;  [facility];  Service: Endoscopy;  Laterality: N/A;  730am, asa 3   CYSTOSCOPY WITH STENT PLACEMENT Right 01/27/2016   Procedure: CYSTOSCOPY WITH STENT PLACEMENT;  Surgeon: Garnette Shack, MD;  Location: [facility] ORS;  Service: Urology;  Laterality: Right;   CYSTOSCOPY/RETROGRADE/URETEROSCOPY/STONE EXTRACTION WITH BASKET Right 01/27/2016   Procedure: CYSTOSCOPY, RIGHT RETROGRADE, RIGHT URETEROSCOPY, STONE EXTRACTION ;  Surgeon: Garnette Shack, MD;  Location: [facility] OR[facility];  Service: Urology;   Laterality: Right;   ESOPHAGOGASTRODUODENOSCOPY  10/15/2008   Dr Rourk:Schatzki's ring status post dilation and disruption via 32 F Maloney dilator/ otherwise unremarkable esophagus, small hiatal hernia, multiple fundal gland polyps not manipulated, gastritis, negative H.pylori   ESOPHAGOGASTRODUODENOSCOPY  06/21/2002   [facility]:Dfjoo sliding hiatal hernia with mild changes of reflux esophagitis limited to gastroesophageal junction.  Noncritical ring at distal esophagus, 3 cm proximal to gastroesophageal junction/Antral gastritis   FACIAL COSMETIC SURGERY  ` 1985   broke face playing softball   FLEXIBLE SIGMOIDOSCOPY N/A 07/28/2022   Procedure: FLEXIBLE SIGMOIDOSCOPY;  Surgeon: Shaaron Lamar HERO, MD;  [facility];  Service: Endoscopy;  Laterality: N/A;   FRACTURE SURGERY     left wrist; broke it; took spur off   HEMOSTASIS CLIP PLACEMENT  02/23/2023   Procedure: HEMOSTASIS CLIP PLACEMENT;  Surgeon: Shaaron Lamar HERO, MD;  [facility];  Service: Endoscopy;;   HOLMIUM LASER APPLICATION Right 01/27/2016   Procedure: HOLMIUM LASER APPLICATION;  Surgeon: Garnette Shack, MD;  Location: [facility];  Service: Urology;  Laterality: Right;   KNEE ARTHROSCOPY Right 05/18/2018   Procedure: PARTIAL MEDIAL MENISECTOMY AND SURGICAL LAVAGE AND CHONDROPLASTY;  Surgeon: Josefina Chew, MD;  Location: [facility] OR;  Service: Orthopedics;  Laterality: Right;   LEFT HEART CATHETERIZATION WITH CORONARY ANGIOGRAM N/A 12/27/2013   Procedure: LEFT HEART CATHETERIZATION WITH CORONARY ANGIOGRAM;  Surgeon: Peter M [place], MD;  Location: [facility] CATH LAB;  Service: Cardiovascular;  Laterality: N/A;   neck epidural     POLYPECTOMY  09/15/2022   Procedure: POLYPECTOMY;  Surgeon: Shaaron Lamar HERO, MD;  [facility];  Service: Endoscopy;;   POLYPECTOMY  02/23/2023   Procedure: POLYPECTOMY INTESTINAL;  Surgeon: Shaaron Lamar HERO, MD;  [facility];  Service: Endoscopy;;   POSTERIOR LUMBAR FUSION 4 LEVEL  N/A 03/28/2018   Procedure: Thoracic eight -Lumbar two- FIXATION WITH SCREW PLACEMENT, DECOMPRESSION Thoracic ten-Thoracic eleven  FOR OSTEOMYELITIS;  Surgeon: Colon Shove, MD;  Location: [facility];  Service: Neurosurgery;  Laterality: N/A;   SCLEROTHERAPY  09/15/2022   Procedure: SCLEROTHERAPY;  Surgeon: Shaaron Lamar HERO, MD;  [facility];  Service: Endoscopy;;   SHOULDER SURGERY Bilateral    SPINAL CORD STIMULATOR REMOVAL N/A 02/25/2020   Procedure: LUMBAR SPINAL CORD STIMULATOR REMOVAL;  Surgeon: Colon Shove, MD;  Location[facility] OR;  Service: Neurosurgery;  Laterality: N/A;   TEE WITHOUT CARDIOVERSION N/A 03/21/2018   Procedure: TRANSESOPHAGEAL ECHOCARDIOGRAM (TEE) WITH PROPOFOL ;  Surgeon: Debera Jayson MATSU, MD;  [facility];  Service: Cardiovascular;  Laterality: N/A;    Home Medications:  Allergies as of 09/04/2024       Reactions   Lisinopril  Cough   Pt doesn't remember reaction        Medication List        Accurate as of September 03, 2024  7:10 AM. If you have any questions, ask your nurse or doctor.  acetaminophen  325 MG tablet Commonly known as: TYLENOL  Take 2 tablets (650 mg total) by mouth every 6 (six) hours as needed for mild pain (pain score 1-3) (or Fever >/= 101).   alfuzosin  10 MG 24 hr tablet Commonly known as: UROXATRAL  Take 10 mg by mouth daily with breakfast.   ARIPiprazole  5 MG tablet Commonly known as: ABILIFY  Take 5 mg by mouth daily.   ascorbic acid 500 MG tablet Commonly known as: VITAMIN C Take 500 mg by mouth 3 (three) times a week.   bisoprolol -hydrochlorothiazide 2.5-6.25 MG tablet Commonly known as: ZIAC Take 1 tablet every day by oral route.   clopidogrel  75 MG tablet Commonly known as: PLAVIX  Take 1 tablet (75 mg total) by mouth daily.   cyanocobalamin  500 MCG tablet Commonly known as: VITAMIN B12 Take 500 mcg by mouth daily.   doxycycline  100 MG capsule Commonly known as: VIBRAMYCIN  Take 1  capsule (100 mg total) by mouth every 12 (twelve) hours.   Eliquis 5 MG Tabs tablet Generic drug: apixaban TAKE ONE TABLET BY MOUTH TWICE A DAY   Entresto  97-103 MG Generic drug: sacubitril -valsartan  Take 1 tablet by mouth 2 (two) times daily.   finasteride  5 MG tablet Commonly known as: PROSCAR  Take 1 tablet (5 mg total) by mouth daily. Reported on 05/04/2016   fluticasone  50 MCG/ACT nasal spray Commonly known as: FLONASE  Place 1 spray into both nostrils daily as needed for allergies.   furosemide  20 MG tablet Commonly known as: Lasix  Take 1 tablet (20 mg total) by mouth daily as needed for edema.   gabapentin  100 MG capsule Commonly known as: NEURONTIN  Take 100 mg by mouth at bedtime.   hydroxyurea  500 MG capsule Commonly known as: HYDREA  Take 3 capsules (1,500 mg total) by mouth every Monday, Wednesday, and Friday AND 2 capsules (1,000 mg total) every Tuesday, Thursday, Saturday, and Sunday. May take with food to minimize GI side effects.   ketoconazole  2 % cream Commonly known as: NIZORAL  Apply 1 Application topically 2 (two) times daily.   Linzess  290 MCG Caps capsule Generic drug: linaclotide  Take 290 mcg by mouth as needed.   megestrol  400 MG/10ML suspension Commonly known as: MEGACE  Take 10 mLs (400 mg total) by mouth 2 (two) times daily.   methocarbamol  500 MG tablet Commonly known as: ROBAXIN  Take 500 mg by mouth.   metoprolol succinate 25 MG 24 hr tablet Commonly known as: TOPROL-XL Take 12.5 mg by mouth daily.   mupirocin cream 2 % Commonly known as: BACTROBAN Apply topically.   mupirocin ointment 2 % Commonly known as: BACTROBAN Apply 1 Application topically daily.   oxyCODONE -acetaminophen  5-325 MG tablet Commonly known as: PERCOCET/ROXICET Take by mouth.   pantoprazole  40 MG tablet Commonly known as: PROTONIX  Take 1 tablet by mouth daily.   potassium chloride  SA 20 MEQ tablet Commonly known as: Klor-Con  M20 Take 1 tablet (20 mEq total)  by mouth daily for 5 days, THEN 1 tablet (20 mEq total) daily as needed (When you take your lasix ). Start taking on: August 03, 2024   rosuvastatin  20 MG tablet Commonly known as: CRESTOR  Take 20 mg by mouth daily.   senna-docusate 8.6-50 MG tablet Commonly known as: Senokot-S   spironolactone  25 MG tablet Commonly known as: ALDACTONE  Take 25 mg by mouth daily.   traZODone  50 MG tablet Commonly known as: DESYREL  Take 50 mg by mouth at bedtime.   venlafaxine  XR 150 MG 24 hr capsule Commonly known as: EFFEXOR -XR Take 1 capsule (150 mg  total) by mouth 2 (two) times daily.   Vitamin D 50 MCG (2000 UT) Caps Take 2,000 Units by mouth daily.   Voltaren 1 % Gel Generic drug: diclofenac Sodium Apply topically 4 (four) times daily.        Allergies:  Allergies  Allergen Reactions   Lisinopril  Cough    Pt doesn't remember reaction    Family History  Problem Relation Age of Onset   Emphysema Father    Heart failure Father    Lung cancer Father    CAD Father    Colon cancer Mother    Stroke Mother    Breast cancer Mother    Stroke Sister    Heart attack Brother    Dementia Paternal Uncle    Emphysema Maternal Grandmother    Stroke Maternal Grandmother    Asthma Other        grandson   Heart disease Paternal Grandfather    Anesthesia problems Neg Hx    Hypotension Neg Hx    Malignant hyperthermia Neg Hx    Pseudochol deficiency Neg Hx     Social History:  reports that he quit smoking about 65 years ago. His smoking use included cigarettes. His smokeless tobacco use includes chew. He reports that he does not currently use alcohol. He reports that he does not use drugs.  ROS: A complete review of systems was performed.  All systems are negative except for pertinent findings as noted.  Physical Exam:  Vital signs in last 24 hours: There were no vitals taken for this visit. Constitutional:  Alert and oriented, No acute distress Cardiovascular: Regular rate   Respiratory: Normal respiratory effort Neurologic: Grossly intact, no focal deficits Psychiatric: Normal mood and affect  I have reviewed prior pt notes  I have independently reviewed prior imaging--MRI results  I have reviewed prior PSA results  I have reviewed prior pathology results

## 2024-09-04 ENCOUNTER — Ambulatory Visit: Admitting: Urology

## 2024-09-04 VITALS — BP 94/63 | HR 77

## 2024-09-04 DIAGNOSIS — N400 Enlarged prostate without lower urinary tract symptoms: Secondary | ICD-10-CM | POA: Diagnosis not present

## 2024-09-04 DIAGNOSIS — I2699 Other pulmonary embolism without acute cor pulmonale: Secondary | ICD-10-CM | POA: Diagnosis not present

## 2024-09-04 DIAGNOSIS — T24111D Burn of first degree of right thigh, subsequent encounter: Secondary | ICD-10-CM | POA: Diagnosis not present

## 2024-09-04 DIAGNOSIS — C61 Malignant neoplasm of prostate: Secondary | ICD-10-CM

## 2024-09-04 DIAGNOSIS — I5022 Chronic systolic (congestive) heart failure: Secondary | ICD-10-CM | POA: Diagnosis not present

## 2024-09-04 DIAGNOSIS — G8220 Paraplegia, unspecified: Secondary | ICD-10-CM | POA: Diagnosis not present

## 2024-09-04 DIAGNOSIS — R972 Elevated prostate specific antigen [PSA]: Secondary | ICD-10-CM

## 2024-09-04 DIAGNOSIS — Z48812 Encounter for surgical aftercare following surgery on the circulatory system: Secondary | ICD-10-CM | POA: Diagnosis not present

## 2024-09-04 DIAGNOSIS — N138 Other obstructive and reflux uropathy: Secondary | ICD-10-CM

## 2024-09-04 DIAGNOSIS — I110 Hypertensive heart disease with heart failure: Secondary | ICD-10-CM | POA: Diagnosis not present

## 2024-09-04 NOTE — Progress Notes (Signed)
 Bladder Scan completed today.  Patient cannot void prior to the bladder scan. Bladder scan result: [id number]  Performed By: Exie DASEN. CMA  Additional notes-

## 2024-09-12 DIAGNOSIS — G8220 Paraplegia, unspecified: Secondary | ICD-10-CM | POA: Diagnosis not present

## 2024-09-12 DIAGNOSIS — I2699 Other pulmonary embolism without acute cor pulmonale: Secondary | ICD-10-CM | POA: Diagnosis not present

## 2024-09-12 DIAGNOSIS — T24111D Burn of first degree of right thigh, subsequent encounter: Secondary | ICD-10-CM | POA: Diagnosis not present

## 2024-09-12 DIAGNOSIS — Z48812 Encounter for surgical aftercare following surgery on the circulatory system: Secondary | ICD-10-CM | POA: Diagnosis not present

## 2024-09-12 DIAGNOSIS — I5022 Chronic systolic (congestive) heart failure: Secondary | ICD-10-CM | POA: Diagnosis not present

## 2024-09-12 DIAGNOSIS — I110 Hypertensive heart disease with heart failure: Secondary | ICD-10-CM | POA: Diagnosis not present

## 2024-09-18 DIAGNOSIS — I110 Hypertensive heart disease with heart failure: Secondary | ICD-10-CM | POA: Diagnosis not present

## 2024-09-18 DIAGNOSIS — I5022 Chronic systolic (congestive) heart failure: Secondary | ICD-10-CM | POA: Diagnosis not present

## 2024-09-18 DIAGNOSIS — T24111D Burn of first degree of right thigh, subsequent encounter: Secondary | ICD-10-CM | POA: Diagnosis not present

## 2024-09-18 DIAGNOSIS — I2699 Other pulmonary embolism without acute cor pulmonale: Secondary | ICD-10-CM | POA: Diagnosis not present

## 2024-09-18 DIAGNOSIS — G8220 Paraplegia, unspecified: Secondary | ICD-10-CM | POA: Diagnosis not present

## 2024-09-18 DIAGNOSIS — Z48812 Encounter for surgical aftercare following surgery on the circulatory system: Secondary | ICD-10-CM | POA: Diagnosis not present

## 2024-09-19 DIAGNOSIS — T24111D Burn of first degree of right thigh, subsequent encounter: Secondary | ICD-10-CM | POA: Diagnosis not present

## 2024-09-19 DIAGNOSIS — Z48812 Encounter for surgical aftercare following surgery on the circulatory system: Secondary | ICD-10-CM | POA: Diagnosis not present

## 2024-09-19 DIAGNOSIS — G8220 Paraplegia, unspecified: Secondary | ICD-10-CM | POA: Diagnosis not present

## 2024-09-19 DIAGNOSIS — I2699 Other pulmonary embolism without acute cor pulmonale: Secondary | ICD-10-CM | POA: Diagnosis not present

## 2024-09-19 DIAGNOSIS — I110 Hypertensive heart disease with heart failure: Secondary | ICD-10-CM | POA: Diagnosis not present

## 2024-09-19 DIAGNOSIS — I5022 Chronic systolic (congestive) heart failure: Secondary | ICD-10-CM | POA: Diagnosis not present

## 2024-09-26 DIAGNOSIS — T24111D Burn of first degree of right thigh, subsequent encounter: Secondary | ICD-10-CM | POA: Diagnosis not present

## 2024-09-26 DIAGNOSIS — I5022 Chronic systolic (congestive) heart failure: Secondary | ICD-10-CM | POA: Diagnosis not present

## 2024-09-26 DIAGNOSIS — Z6835 Body mass index (BMI) 35.0-35.9, adult: Secondary | ICD-10-CM | POA: Diagnosis not present

## 2024-09-26 DIAGNOSIS — G8220 Paraplegia, unspecified: Secondary | ICD-10-CM | POA: Diagnosis not present

## 2024-09-26 DIAGNOSIS — M549 Dorsalgia, unspecified: Secondary | ICD-10-CM | POA: Diagnosis not present

## 2024-09-26 DIAGNOSIS — I2699 Other pulmonary embolism without acute cor pulmonale: Secondary | ICD-10-CM | POA: Diagnosis not present

## 2024-09-26 DIAGNOSIS — D649 Anemia, unspecified: Secondary | ICD-10-CM | POA: Diagnosis not present

## 2024-09-26 DIAGNOSIS — K219 Gastro-esophageal reflux disease without esophagitis: Secondary | ICD-10-CM | POA: Diagnosis not present

## 2024-09-26 DIAGNOSIS — G8929 Other chronic pain: Secondary | ICD-10-CM | POA: Diagnosis not present

## 2024-09-26 DIAGNOSIS — Z7901 Long term (current) use of anticoagulants: Secondary | ICD-10-CM | POA: Diagnosis not present

## 2024-09-26 DIAGNOSIS — Z8673 Personal history of transient ischemic attack (TIA), and cerebral infarction without residual deficits: Secondary | ICD-10-CM | POA: Diagnosis not present

## 2024-09-26 DIAGNOSIS — I110 Hypertensive heart disease with heart failure: Secondary | ICD-10-CM | POA: Diagnosis not present

## 2024-09-26 DIAGNOSIS — Z48812 Encounter for surgical aftercare following surgery on the circulatory system: Secondary | ICD-10-CM | POA: Diagnosis not present

## 2024-09-26 DIAGNOSIS — I2510 Atherosclerotic heart disease of native coronary artery without angina pectoris: Secondary | ICD-10-CM | POA: Diagnosis not present

## 2024-09-26 DIAGNOSIS — I4510 Unspecified right bundle-branch block: Secondary | ICD-10-CM | POA: Diagnosis not present

## 2024-09-26 DIAGNOSIS — Z7902 Long term (current) use of antithrombotics/antiplatelets: Secondary | ICD-10-CM | POA: Diagnosis not present

## 2024-09-26 DIAGNOSIS — E785 Hyperlipidemia, unspecified: Secondary | ICD-10-CM | POA: Diagnosis not present

## 2024-09-26 DIAGNOSIS — Z86718 Personal history of other venous thrombosis and embolism: Secondary | ICD-10-CM | POA: Diagnosis not present

## 2024-09-26 DIAGNOSIS — Z87891 Personal history of nicotine dependence: Secondary | ICD-10-CM | POA: Diagnosis not present

## 2024-09-26 DIAGNOSIS — F411 Generalized anxiety disorder: Secondary | ICD-10-CM | POA: Diagnosis not present

## 2024-09-26 DIAGNOSIS — G4733 Obstructive sleep apnea (adult) (pediatric): Secondary | ICD-10-CM | POA: Diagnosis not present

## 2024-09-26 DIAGNOSIS — G603 Idiopathic progressive neuropathy: Secondary | ICD-10-CM | POA: Diagnosis not present

## 2024-10-04 DIAGNOSIS — I2699 Other pulmonary embolism without acute cor pulmonale: Secondary | ICD-10-CM | POA: Diagnosis not present

## 2024-10-04 DIAGNOSIS — G8220 Paraplegia, unspecified: Secondary | ICD-10-CM | POA: Diagnosis not present

## 2024-10-04 DIAGNOSIS — T24111D Burn of first degree of right thigh, subsequent encounter: Secondary | ICD-10-CM | POA: Diagnosis not present

## 2024-10-04 DIAGNOSIS — I110 Hypertensive heart disease with heart failure: Secondary | ICD-10-CM | POA: Diagnosis not present

## 2024-10-04 DIAGNOSIS — Z48812 Encounter for surgical aftercare following surgery on the circulatory system: Secondary | ICD-10-CM | POA: Diagnosis not present

## 2024-10-04 DIAGNOSIS — I5022 Chronic systolic (congestive) heart failure: Secondary | ICD-10-CM | POA: Diagnosis not present

## 2024-10-15 ENCOUNTER — Telehealth: Payer: Self-pay | Admitting: Cardiology

## 2024-10-15 DIAGNOSIS — T24311D Burn of third degree of right thigh, subsequent encounter: Secondary | ICD-10-CM | POA: Diagnosis not present

## 2024-10-15 DIAGNOSIS — I5020 Unspecified systolic (congestive) heart failure: Secondary | ICD-10-CM | POA: Diagnosis not present

## 2024-10-15 DIAGNOSIS — M79651 Pain in right thigh: Secondary | ICD-10-CM | POA: Diagnosis not present

## 2024-10-15 DIAGNOSIS — T24311S Burn of third degree of right thigh, sequela: Secondary | ICD-10-CM | POA: Diagnosis not present

## 2024-10-15 NOTE — Telephone Encounter (Signed)
 Patient says that back in June he had to go to the hospital in [place], [place] for a blood clot in his leg. He says that he had a filter put in his neck to stop the clot in his leg from going to his heart and lungs. He states he just received a call from the hospital saying that it was time to take the filter out and that he needed to find a doctor to do this. He is not sure if this is something Dr. Debera could do or who he needs to contact to have filter removed.
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
## 2024-10-15 NOTE — Telephone Encounter (Signed)
 My call went direct to voicemail.  Left message to return call.  Patient will need to speak with IR-interventional radiologist, preferably the one who placed his IVC filter to have removed.

## 2024-10-16 ENCOUNTER — Telehealth: Payer: Self-pay | Admitting: *Deleted

## 2024-10-16 ENCOUNTER — Ambulatory Visit: Admitting: Neurology

## 2024-10-16 ENCOUNTER — Other Ambulatory Visit: Payer: Self-pay | Admitting: *Deleted

## 2024-10-16 ENCOUNTER — Encounter: Payer: Self-pay | Admitting: Neurology

## 2024-10-16 VITALS — BP 163/71 | HR 50 | Resp 17 | Ht 77.0 in

## 2024-10-16 DIAGNOSIS — R251 Tremor, unspecified: Secondary | ICD-10-CM | POA: Insufficient documentation

## 2024-10-16 DIAGNOSIS — I2690 Septic pulmonary embolism without acute cor pulmonale: Secondary | ICD-10-CM

## 2024-10-16 DIAGNOSIS — I2692 Saddle embolus of pulmonary artery without acute cor pulmonale: Secondary | ICD-10-CM

## 2024-10-16 DIAGNOSIS — G959 Disease of spinal cord, unspecified: Secondary | ICD-10-CM

## 2024-10-16 DIAGNOSIS — Z8679 Personal history of other diseases of the circulatory system: Secondary | ICD-10-CM

## 2024-10-16 DIAGNOSIS — I824Z3 Acute embolism and thrombosis of unspecified deep veins of distal lower extremity, bilateral: Secondary | ICD-10-CM

## 2024-10-16 DIAGNOSIS — I63212 Cerebral infarction due to unspecified occlusion or stenosis of left vertebral arteries: Secondary | ICD-10-CM

## 2024-10-16 NOTE — Telephone Encounter (Signed)
 Patient called stating that he received a call from IR in [place], [place] where he had initial IVC filter placed on 06/01/24.  He advised that they recommended removal in December.  Placed 06/01/2024.  Due to his limited mobility and limitations it was suggested that he have it removed locally.  Order received and discussed with Delon Hope, NP.  Will refer to IR for scheduled removal.  Patient aware and verbalized understanding.

## 2024-10-16 NOTE — Telephone Encounter (Signed)
 Pt returning call

## 2024-10-16 NOTE — Progress Notes (Addendum)
 PATIENT: Cody Velez DOB: 1946/08/09  REASON FOR VISIT: Follow up for cerebrovascular disease, gait disorder, thoracic myelopathy HISTORY FROM: Patient, girlfriend Cody Velez  PRIMARY NEUROLOGIST: Cody Velez  ASSESSMENT AND PLAN 78 y.o. year old male  1.  History of thoracic myelopathy 2.  Gait disorder 3.  Cerebrovascular disease 4.  Tremor  5.  Pontine stroke 2022  - Memory is doing well, MMSE 30/30, remains off Aricept  and Namenda  - Tremor to the left hand with action and resting component that are intermittent, long standing, worsened after hospitalization in June for sepsis, DVT/PE, IVC filter placement. At baseline, mild weakness to the left side, spasticity   -Check TSH  -MRI brain October 2024 showed advanced chronic small vessel disease -On Plavix  for secondary stroke prevention, he is also on Eliquis for saddle PE, hematology has recommended indefinite anticoagulation because of limited mobility, will discuss with Cody Velez if from neuro stand point we can stop Plavix  since on Eliquis now -Keep appointment with Cody Velez in January, will CC her the note to see if she would like anything further added to workup   Addendum 11/06/24 SS; reviewed with Cody Velez from stroke standpoint since on Eliquis, no longer needs Plavix  from neuro standpoint if going to remain on Eliquis. Follow up with cardiology and hematology about long term plan. I called the patient.   HISTORY OF PRESENT ILLNESS: Today 10/16/24 10/16/24 SS: MMSE 30/30. Here with his significant other. Tremor in left hand, has had for few month, appointment with Cody Velez in Jan 2026 for this. Mostly with use (eating/writing), is left handed. Memory is doing well. Takes gabapentin  100 mg BID for his back. On Plavix . This summer admitted for PE and DVT, was septic, went to rehab for 1 month, had IVC filter placed. Some walking with a walker and PT. Has a systems analyst for upper body strength. Tremor worsened after hospitalization. I  see tremor mostly action, but some resting component on the left, is intermittent. In motor wheelchair.  03/08/23 SS: MMSE 29/30. Is no longer taking Aricept  and Namenda , claims was told not to keep taking. His short term memory is poor. Trouble with recall. Significant other doesn't think any worse. Headaches have resolved. On gabapentin . Isn't taking baclofen . Has electric scooter, admits being lazy. Doesn't do much walking, has a pedal pusher. He can stand and walk short distance with a walker. Remains on Plavix . A fall 1 month ago, slipped getting into the car, forget to pull step up on scooter. Has a fleeta with hand controls. We talked about his original issue with spinal stimulator infection in 2019. Had right pontine stroke in 2022.   Update 03/03/2022  SS: Cody Velez here today for follow-up.  Headaches have resolved since restarting gabapentin  and baclofen .  He remains on Plavix  post pontine stroke in 2022.  He has residual right hemiparesis.  He is nonambulatory, but can stand for transfers.  He is sleeping well with trazodone .  Memory is stable, has benign forgetfulness.  Remains on Aricept , reportedly took himself off Namenda .  Claims he saw a neurologist at the [place], was told he did not have Alzheimer's disease.  He developed thoracic myelopathy back 4 years ago after he became septic from a spinal stimulator.  He is planning to go to the gym tomorrow working with a trainer on his strength.  MMSE 29/30 today.  HISTORY  09/01/2021 Cody Velez: Cody Velez is a 78 year old left-handed white male with a history of a thoracic myelopathy  and a gait disorder.  The patient sustained a pontine stroke earlier in 2022, he has had a mild residual right hemiparesis.  He is essentially nonambulatory at this point but he can stand for transfers.  He has altered a lot of his own medications recently, he has run out of gabapentin  and reduce his baclofen  dose and his trazodone  dose.  He is not sleeping well, he has  developed increased headaches in the back of the head and does not feel good in general.  He has had no new sudden episodes of weakness or numbness.  Headache has been daily for him recently.  REVIEW OF SYSTEMS: Out of a complete 14 system review of symptoms, the patient complains only of the following symptoms, and all other reviewed systems are negative.  See HPI  ALLERGIES: Allergies  Allergen Reactions   Lisinopril  Cough    Pt doesn't remember reaction    HOME MEDICATIONS: Outpatient Medications Prior to Visit  Medication Sig Dispense Refill   acetaminophen  (TYLENOL ) 325 MG tablet Take 2 tablets (650 mg total) by mouth every 6 (six) hours as needed for mild pain (pain score 1-3) (or Fever >/= 101).     alfuzosin  (UROXATRAL ) 10 MG 24 hr tablet Take 10 mg by mouth daily with breakfast.     apixaban (ELIQUIS) 5 MG TABS tablet TAKE ONE TABLET BY MOUTH TWICE A DAY 60 tablet 5   ARIPiprazole  (ABILIFY ) 5 MG tablet Take 5 mg by mouth daily.     ascorbic acid (VITAMIN C) 500 MG tablet Take 500 mg by mouth 3 (three) times a week.     bisoprolol -hydrochlorothiazide (ZIAC) 2.5-6.25 MG tablet Take 1 tablet every day by oral route.     Cholecalciferol (VITAMIN D) 50 MCG (2000 UT) CAPS Take 2,000 Units by mouth daily.     clopidogrel  (PLAVIX ) 75 MG tablet Take 1 tablet (75 mg total) by mouth daily. 30 tablet 2   cyanocobalamin  (VITAMIN B12) 500 MCG tablet Take 500 mcg by mouth daily.     diclofenac Sodium (VOLTAREN) 1 % GEL Apply topically 4 (four) times daily.     doxycycline  (VIBRAMYCIN ) 100 MG capsule Take 1 capsule (100 mg total) by mouth every 12 (twelve) hours. 14 capsule 0   finasteride  (PROSCAR ) 5 MG tablet Take 1 tablet (5 mg total) by mouth daily. Reported on 05/04/2016 30 tablet 0   fluticasone  (FLONASE ) 50 MCG/ACT nasal spray Place 1 spray into both nostrils daily as needed for allergies. 16 g 2   furosemide  (LASIX ) 20 MG tablet Take 1 tablet (20 mg total) by mouth daily as needed for  edema. 30 tablet 11   furosemide  (LASIX ) 20 MG tablet Take 20 mg by mouth daily.     gabapentin  (NEURONTIN ) 100 MG capsule Take 100 mg by mouth at bedtime.     hydroxyurea  (HYDREA ) 500 MG capsule Take 3 capsules (1,500 mg total) by mouth every Monday, Wednesday, and Friday AND 2 capsules (1,000 mg total) every Tuesday, Thursday, Saturday, and Sunday. May take with food to minimize GI side effects. 219 capsule 0   ketoconazole  (NIZORAL ) 2 % cream Apply 1 Application topically 2 (two) times daily. 60 g 6   linaclotide  (LINZESS ) 290 MCG CAPS capsule Take 290 mcg by mouth as needed.     megestrol  (MEGACE ) 400 MG/10ML suspension Take 10 mLs (400 mg total) by mouth 2 (two) times daily. 480 mL 2   methocarbamol  (ROBAXIN ) 500 MG tablet Take 500 mg by mouth.  metoprolol succinate (TOPROL-XL) 25 MG 24 hr tablet Take 12.5 mg by mouth daily.     mupirocin cream (BACTROBAN) 2 % Apply topically.     mupirocin ointment (BACTROBAN) 2 % Apply 1 Application topically daily.     mupirocin ointment (BACTROBAN) 2 % Apply 1 Application topically daily.     oxyCODONE -acetaminophen  (PERCOCET/ROXICET) 5-325 MG tablet Take by mouth.     pantoprazole  (PROTONIX ) 40 MG tablet Take 1 tablet by mouth daily.     potassium chloride  SA (KLOR-CON  M20) 20 MEQ tablet Take 1 tablet (20 mEq total) by mouth daily for 5 days, THEN 1 tablet (20 mEq total) daily as needed (When you take your lasix ). 90 tablet 3   rosuvastatin  (CRESTOR ) 20 MG tablet Take 20 mg by mouth daily.     sacubitril -valsartan  (ENTRESTO ) 97-103 MG Take 1 tablet by mouth 2 (two) times daily. 60 tablet 5   senna-docusate (SENOKOT-S) 8.6-50 MG tablet      spironolactone  (ALDACTONE ) 25 MG tablet Take 25 mg by mouth daily.     traZODone  (DESYREL ) 50 MG tablet Take 50 mg by mouth at bedtime.     venlafaxine  XR (EFFEXOR -XR) 150 MG 24 hr capsule Take 1 capsule (150 mg total) by mouth 2 (two) times daily. 60 capsule 0   No facility-administered medications prior to  visit.    PAST MEDICAL HISTORY: Past Medical History:  Diagnosis Date   Allergic rhinitis    Arthritis    B12 deficiency    Cervicogenic headache    Childhood asthma    Chronic back pain    Coronary atherosclerosis of native coronary artery    Mild nonobstructive CAD at catheterization January 2015   Dementia [facility])    Depression    Essential hypertension    Falls    GERD (gastroesophageal reflux disease)    Glaucoma    History of blood transfusion    History of cerebrovascular disease 07/23/2015   History of kidney stones    History of pneumonia 02/2011   Hyperlipidemia    OSA (obstructive sleep apnea)    CPAP - Dr. Corrie - uses cpap every night   Pneumonia    Prostate cancer [facility])    PTSD (post-traumatic stress disorder)    [place]   Rectal bleeding    Stroke (HCC) 03/2021   Wheelchair bound     PAST SURGICAL HISTORY: Past Surgical History:  Procedure Laterality Date   APPLICATION OF ROBOTIC ASSISTANCE FOR SPINAL PROCEDURE N/A 03/28/2018   Procedure: APPLICATION OF ROBOTIC ASSISTANCE FOR SPINAL PROCEDURE;  Surgeon: Colon Shove, MD;  Location: [facility] OR;  Service: Neurosurgery;  Laterality: N/A;   BACK SURGERY  02/14/2012   lumbar OR #7; today redid L1L2; replaced screws; added bone from hip   BILATERAL KNEE ARTHROSCOPY     COLONOSCOPY  10/15/2008   Dr. Shaaron: tubular adenoma    COLONOSCOPY  12/17/2003   MFM:Wnmfjo rectal and colon   COLONOSCOPY N/A 09/05/2014   tubular adenoma   COLONOSCOPY WITH PROPOFOL  N/A 09/15/2022   Multiple 3-25 mm polyps in ascending colon and cecum, markedly elongated and redundant colon. One polyp removed piecemeal fashion. 6 month surveillance recommended. Tubular adenomas.   COLONOSCOPY WITH PROPOFOL  N/A 02/23/2023   Procedure: COLONOSCOPY WITH PROPOFOL ;  Surgeon: Shaaron Lamar HERO, MD;  [facility]: [facility];  Service: Endoscopy;  Laterality: N/A;  730am, asa 3   CYSTOSCOPY WITH STENT PLACEMENT Right 01/27/2016   Procedure: CYSTOSCOPY  WITH STENT PLACEMENT;  Surgeon: Garnette Shack, MD;  Location: [facility] ORS;  Service: Urology;  Laterality: Right;   CYSTOSCOPY/RETROGRADE/URETEROSCOPY/STONE EXTRACTION WITH BASKET Right 01/27/2016   Procedure: CYSTOSCOPY, RIGHT RETROGRADE, RIGHT URETEROSCOPY, STONE EXTRACTION ;  Surgeon: Garnette Shack, MD;  Location: [facility] OR[facility];  Service: Urology;  Laterality: Right;   ESOPHAGOGASTRODUODENOSCOPY  10/15/2008   Dr Rourk:Schatzki's ring status post dilation and disruption via 58 F Maloney dilator/ otherwise unremarkable esophagus, small hiatal hernia, multiple fundal gland polyps not manipulated, gastritis, negative H.pylori   ESOPHAGOGASTRODUODENOSCOPY  06/21/2002   [facility]:Dfjoo sliding hiatal hernia with mild changes of reflux esophagitis limited to gastroesophageal junction.  Noncritical ring at distal esophagus, 3 cm proximal to gastroesophageal junction/Antral gastritis   FACIAL COSMETIC SURGERY  ` 1985   broke face playing softball   FLEXIBLE SIGMOIDOSCOPY N/A 07/28/2022   Procedure: FLEXIBLE SIGMOIDOSCOPY;  Surgeon: Shaaron Lamar HERO, MD;  [facility];  Service: Endoscopy;  Laterality: N/A;   FRACTURE SURGERY     left wrist; broke it; took spur off   HEMOSTASIS CLIP PLACEMENT  02/23/2023   Procedure: HEMOSTASIS CLIP PLACEMENT;  Surgeon: Shaaron Lamar HERO, MD;  [facility];  Service: Endoscopy;;   HOLMIUM LASER APPLICATION Right 01/27/2016   Procedure: HOLMIUM LASER APPLICATION;  Surgeon: Garnette Shack, MD;  Location: [facility];  Service: Urology;  Laterality: Right;   KNEE ARTHROSCOPY Right 05/18/2018   Procedure: PARTIAL MEDIAL MENISECTOMY AND SURGICAL LAVAGE AND CHONDROPLASTY;  Surgeon: Josefina Chew, MD;  Location: [facility] OR;  Service: Orthopedics;  Laterality: Right;   LEFT HEART CATHETERIZATION WITH CORONARY ANGIOGRAM N/A 12/27/2013   Procedure: LEFT HEART CATHETERIZATION WITH CORONARY ANGIOGRAM;  Surgeon: Peter M Jordan, MD;  Location: [facility] C[facility] LAB;  Service:  Cardiovascular;  Laterality: N/A;   neck epidural     POLYPECTOMY  09/15/2022   Procedure: POLYPECTOMY;  Surgeon: Shaaron Lamar HERO, MD;  [facility];  Service: Endoscopy;;   POLYPECTOMY  02/23/2023   Procedure: POLYPECTOMY INTESTINAL;  Surgeon: Shaaron Lamar HERO, MD;  [facility];  Service: Endoscopy;;   POSTERIOR LUMBAR FUSION 4 LEVEL N/A 03/28/2018   Procedure: Thoracic eight -Lumbar two- FIXATION WITH SCREW PLACEMENT, DECOMPRESSION Thoracic ten-Thoracic eleven  FOR OSTEOMYELITIS;  Surgeon: Colon Shove, MD;  Location: [facility];  Service: Neurosurgery;  Laterality: N/A;   SCLEROTHERAPY  09/15/2022   Procedure: SCLEROTHERAPY;  Surgeon: Shaaron Lamar HERO, MD;  [facility];  Service: Endoscopy;;   SHOULDER SURGERY Bilateral    SPINAL CORD STIMULATOR REMOVAL N/A 02/25/2020   Procedure: LUMBAR SPINAL CORD STIMULATOR REMOVAL;  Surgeon: Colon Shove, MD;  Location: [facility] OR;  Service: Neurosurgery;  Laterality: N/A;   TEE WITHOUT CARDIOVERSION N/A 03/21/2018   Procedure: TRANSESOPHAGEAL ECHOCARDIOGRAM (TEE) WITH PROPOFOL ;  Surgeon: Debera Jayson MATSU, MD;  [facility];  Service: Cardiovascular;  Laterality: N/A;    FAMILY HISTORY: Family History  Problem Relation Age of Onset   Emphysema Father    Heart failure Father    Lung cancer Father    CAD Father    Colon cancer Mother    Stroke Mother    Breast cancer Mother    Stroke Sister    Heart attack Brother    Dementia Paternal Uncle    Emphysema Maternal Grandmother    Stroke Maternal Grandmother    Asthma Other        grandson   Heart disease Paternal Grandfather    Anesthesia problems Neg Hx    Hypotension Neg Hx    Malignant hyperthermia Neg Hx  Pseudochol deficiency Neg Hx     SOCIAL HISTORY: Social History   Socioeconomic History   Marital status: Widowed    Spouse name: Rojelio   Number of children: 3   Years of education: Not on file   Highest education level: Not on file   Occupational History   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[employer])    Employer: [employer]  Tobacco Use   Smoking status: Former    Current packs/day: 0.00    Types: Cigarettes    Quit date: 12/20/1958    Years since quitting: 65.8   Smokeless tobacco: Current    Types: Chew  Vaping Use   Vaping status: Never Used  Substance and Sexual Activity   Alcohol use: Not Currently    Alcohol/week: 0.0 standard drinks of alcohol    Comment: couple bottles of wine per month   Drug use: No    Comment: last used marijuana ~ 1969   Sexual activity: Not on file  Other Topics Concern   Not on file  Social History Narrative   Married, 3 daughters; retired   Patient drinks about 1 cup of coffee daily.   Patient is left handed.   Social Drivers of Health   Financial Resource Strain: Low Risk  (07/14/2023)   [facility]   Overall Financial Resource Strain (CARDIA)    Difficulty of Paying Living Expenses: Not very hard  Food Insecurity: No Food Insecurity (06/01/2024)   [facility]   Hunger Vital Sign    Within the past 12 months, you worried that your food would run out before you got the money to buy more.: Never true    Within the past 12 months, the food you bought just didn't last and you didn't have money to get more.: Never true  Transportation Needs: No Transportation Needs (06/01/2024)   Received from [facility]   [facility] - Transportation    In the past 12 months, has lack of transportation kept you from medical appointments or from getting medications?: No    In the past 12 months, has lack of transportation kept you from meetings, work, or from getting things needed for daily living?: No  Physical Activity: Not on file  Stress: Not on file  Social Connections: Unknown (05/03/2022)   Received from [facility]   Social Network    Social Network: Not on file  Intimate Partner Violence: Not At Risk (06/01/2024)   Received [facility]    Humiliation, Afraid, Rape, and Kick questionnaire    Within the last year, have you been afraid of your partner or ex-partner?: No    Within the last year, have you been humiliated or emotionally abused in other ways by your partner or ex-partner?: No    Within the last year, have you been kicked, hit, slapped, or otherwise physically hurt by your partner or ex-partner?: No    Within the last year, have you been raped or forced to have any kind of sexual activity by your partner or ex-partner?: No   PHYSICAL EXAM  Vitals:   10/16/24 1413 10/16/24 1424  BP: (!) 96/59 (!) 163/71  Pulse: (!) 50   Resp: 17   SpO2: 94%   Height: 6' 5 (1.956 m)     Body mass index is 29.76 kg/m.    10/16/2024    2:20 PM 03/08/2023    3:00 PM 03/03/2022    2:59 PM  MMSE - Mini Mental State Exam  Orientation  to time 5 5 4   Orientation to Place 5 5 5   Registration 3 3 3   Attention/ Calculation 5 5 5   Recall 3 3 3   Language- name 2 objects 2 2 2   Language- repeat 1 1 1   Language- follow 3 step command 3 2 3   Language- read & follow direction 1 1 1   Write a sentence 1 1 1   Copy design 1 1 1   Total score 30 29 29    Generalized: Well developed, in no acute distress  Neurological examination  Mentation: Alert oriented to time, place, history taking. Follows all commands speech and language fluent. Speech is normal. Frequent repositioning himself in wheelchair.  Cranial nerve II-XII: Pupils were equal round reactive to light. Extraocular movements were full, visual field were full on confrontational test. Facial sensation and strength were normal. Head turning and shoulder shrug  were normal and symmetric. Motor: Good strength upper extremities, 3/5 bilateral lower extremities. Mild increased tone to left arm and leg.  Sensory: Sensory testing is intact to soft touch on all 4 extremities. No evidence of extinction is noted.  Coordination: Cerebellar testing reveals good finger-nose-finger bilaterally with  mild tremor with left hand with action. Intermittent left hand resting tremor. Mild bradykinesia to left hand with hand taps.  Gait and station: Was not ambulated Reflexes: Deep tendon reflexes are symmetric   DIAGNOSTIC DATA (LABS, IMAGING, TESTING) - I reviewed patient records, labs, notes, testing and imaging myself where available.  Lab Results  Component Value Date   WBC 11.1 (H) 08/28/2024   HGB 16.1 08/28/2024   HCT 52.2 (H) 08/28/2024   MCV 109.0 (H) 08/28/2024   PLT 428 (H) 08/28/2024      Component Value Date/Time   NA 139 08/28/2024 1317   NA 142 12/09/2023 1106   K 4.2 08/28/2024 1317   CL 104 08/28/2024 1317   CO2 25 08/28/2024 1317   GLUCOSE 102 (H) 08/28/2024 1317   BUN 10 08/28/2024 1317   BUN 7 (L) 12/09/2023 1106   CREATININE 1.03 08/28/2024 1317   CREATININE 0.92 10/30/2020 1014   CALCIUM  9.0 08/28/2024 1317   PROT 7.2 08/28/2024 1317   PROT 7.1 02/06/2020 1814   ALBUMIN  3.5 08/28/2024 1317   ALBUMIN  3.8 02/06/2020 1814   AST 16 08/28/2024 1317   ALT 12 08/28/2024 1317   ALKPHOS 73 08/28/2024 1317   BILITOT 0.6 08/28/2024 1317   BILITOT <0.2 02/06/2020 1814   GFRNONAA >60 08/28/2024 1317   GFRAA >60 02/25/2020 1108   Lab Results  Component Value Date   CHOL 175 05/30/2012   HDL 28 (L) 05/30/2012   LDLCALC 108 (H) 05/30/2012   TRIG 194 (H) 05/30/2012   CHOLHDL 6.3 05/30/2012   No results found for: HGBA1C Lab Results  Component Value Date   VITAMINB12 794 05/09/2018   Lab Results  Component Value Date   TSH 0.867 05/09/2018   Lauraine Gayland MANDES, DNP  [facility] [address], [address] [place], [place] [address] [phone]

## 2024-10-16 NOTE — Telephone Encounter (Signed)
 Advised to contact hematology department at [facility] for assistance with this request.  Verbalized understanding of plan.

## 2024-10-17 ENCOUNTER — Ambulatory Visit: Payer: Self-pay | Admitting: Neurology

## 2024-10-17 LAB — TSH: TSH: 1.11 u[IU]/mL (ref 0.450–4.500)

## 2024-10-18 DIAGNOSIS — I2699 Other pulmonary embolism without acute cor pulmonale: Secondary | ICD-10-CM | POA: Diagnosis not present

## 2024-10-18 DIAGNOSIS — G8220 Paraplegia, unspecified: Secondary | ICD-10-CM | POA: Diagnosis not present

## 2024-10-18 DIAGNOSIS — I110 Hypertensive heart disease with heart failure: Secondary | ICD-10-CM | POA: Diagnosis not present

## 2024-10-18 DIAGNOSIS — Z48812 Encounter for surgical aftercare following surgery on the circulatory system: Secondary | ICD-10-CM | POA: Diagnosis not present

## 2024-10-18 DIAGNOSIS — I5022 Chronic systolic (congestive) heart failure: Secondary | ICD-10-CM | POA: Diagnosis not present

## 2024-10-18 DIAGNOSIS — T24111D Burn of first degree of right thigh, subsequent encounter: Secondary | ICD-10-CM | POA: Diagnosis not present

## 2024-10-24 ENCOUNTER — Other Ambulatory Visit: Payer: Self-pay | Admitting: Oncology

## 2024-10-24 ENCOUNTER — Other Ambulatory Visit: Payer: Self-pay | Admitting: Interventional Radiology

## 2024-10-24 ENCOUNTER — Other Ambulatory Visit: Payer: Self-pay

## 2024-10-24 DIAGNOSIS — Z48812 Encounter for surgical aftercare following surgery on the circulatory system: Secondary | ICD-10-CM | POA: Diagnosis not present

## 2024-10-24 DIAGNOSIS — Z8679 Personal history of other diseases of the circulatory system: Secondary | ICD-10-CM

## 2024-10-24 DIAGNOSIS — I2690 Septic pulmonary embolism without acute cor pulmonale: Secondary | ICD-10-CM

## 2024-10-24 DIAGNOSIS — I824Z3 Acute embolism and thrombosis of unspecified deep veins of distal lower extremity, bilateral: Secondary | ICD-10-CM

## 2024-10-24 DIAGNOSIS — G8220 Paraplegia, unspecified: Secondary | ICD-10-CM | POA: Diagnosis not present

## 2024-10-24 DIAGNOSIS — T24111D Burn of first degree of right thigh, subsequent encounter: Secondary | ICD-10-CM | POA: Diagnosis not present

## 2024-10-24 DIAGNOSIS — I2699 Other pulmonary embolism without acute cor pulmonale: Secondary | ICD-10-CM | POA: Diagnosis not present

## 2024-10-24 DIAGNOSIS — I110 Hypertensive heart disease with heart failure: Secondary | ICD-10-CM | POA: Diagnosis not present

## 2024-10-24 DIAGNOSIS — I5022 Chronic systolic (congestive) heart failure: Secondary | ICD-10-CM | POA: Diagnosis not present

## 2024-11-02 ENCOUNTER — Ambulatory Visit: Admitting: Nurse Practitioner

## 2024-11-05 ENCOUNTER — Ambulatory Visit: Admitting: Nurse Practitioner

## 2024-11-06 ENCOUNTER — Inpatient Hospital Stay: Attending: Hematology | Admitting: Oncology

## 2024-11-06 ENCOUNTER — Inpatient Hospital Stay

## 2024-11-06 VITALS — BP 118/68 | HR 76 | Temp 98.7°F | Resp 17 | Ht 75.0 in | Wt 274.0 lb

## 2024-11-06 DIAGNOSIS — Z8042 Family history of malignant neoplasm of prostate: Secondary | ICD-10-CM | POA: Diagnosis not present

## 2024-11-06 DIAGNOSIS — Z86718 Personal history of other venous thrombosis and embolism: Secondary | ICD-10-CM | POA: Diagnosis not present

## 2024-11-06 DIAGNOSIS — Z800 Family history of malignant neoplasm of digestive organs: Secondary | ICD-10-CM | POA: Diagnosis not present

## 2024-11-06 DIAGNOSIS — Z993 Dependence on wheelchair: Secondary | ICD-10-CM | POA: Diagnosis not present

## 2024-11-06 DIAGNOSIS — Z801 Family history of malignant neoplasm of trachea, bronchus and lung: Secondary | ICD-10-CM | POA: Diagnosis not present

## 2024-11-06 DIAGNOSIS — Z7964 Long term (current) use of myelosuppressive agent: Secondary | ICD-10-CM | POA: Diagnosis not present

## 2024-11-06 DIAGNOSIS — R251 Tremor, unspecified: Secondary | ICD-10-CM | POA: Diagnosis not present

## 2024-11-06 DIAGNOSIS — D75839 Thrombocytosis, unspecified: Secondary | ICD-10-CM | POA: Diagnosis not present

## 2024-11-06 DIAGNOSIS — I2699 Other pulmonary embolism without acute cor pulmonale: Secondary | ICD-10-CM

## 2024-11-06 DIAGNOSIS — R6521 Severe sepsis with septic shock: Secondary | ICD-10-CM | POA: Diagnosis not present

## 2024-11-06 DIAGNOSIS — Z7902 Long term (current) use of antithrombotics/antiplatelets: Secondary | ICD-10-CM | POA: Diagnosis not present

## 2024-11-06 DIAGNOSIS — Z1589 Genetic susceptibility to other disease: Secondary | ICD-10-CM

## 2024-11-06 DIAGNOSIS — R5383 Other fatigue: Secondary | ICD-10-CM | POA: Diagnosis not present

## 2024-11-06 DIAGNOSIS — Z8546 Personal history of malignant neoplasm of prostate: Secondary | ICD-10-CM | POA: Diagnosis not present

## 2024-11-06 DIAGNOSIS — D471 Chronic myeloproliferative disease: Secondary | ICD-10-CM

## 2024-11-06 DIAGNOSIS — A419 Sepsis, unspecified organism: Secondary | ICD-10-CM | POA: Diagnosis not present

## 2024-11-06 DIAGNOSIS — Z79899 Other long term (current) drug therapy: Secondary | ICD-10-CM | POA: Diagnosis not present

## 2024-11-06 LAB — CBC WITH DIFFERENTIAL/PLATELET
Abs Immature Granulocytes: 0.08 K/uL — ABNORMAL HIGH (ref 0.00–0.07)
Basophils Absolute: 0.1 K/uL (ref 0.0–0.1)
Basophils Relative: 1 %
Eosinophils Absolute: 0.3 K/uL (ref 0.0–0.5)
Eosinophils Relative: 3 %
HCT: 48.8 % (ref 39.0–52.0)
Hemoglobin: 15.6 g/dL (ref 13.0–17.0)
Immature Granulocytes: 1 %
Lymphocytes Relative: 8 %
Lymphs Abs: 0.8 K/uL (ref 0.7–4.0)
MCH: 38.9 pg — ABNORMAL HIGH (ref 26.0–34.0)
MCHC: 32 g/dL (ref 30.0–36.0)
MCV: 121.7 fL — ABNORMAL HIGH (ref 80.0–100.0)
Monocytes Absolute: 0.7 K/uL (ref 0.1–1.0)
Monocytes Relative: 7 %
Neutro Abs: 8.1 K/uL — ABNORMAL HIGH (ref 1.7–7.7)
Neutrophils Relative %: 80 %
Platelets: 424 K/uL — ABNORMAL HIGH (ref 150–400)
RBC: 4.01 MIL/uL — ABNORMAL LOW (ref 4.22–5.81)
RDW: 14.3 % (ref 11.5–15.5)
Smear Review: NORMAL
WBC: 10.1 K/uL (ref 4.0–10.5)
nRBC: 0 % (ref 0.0–0.2)

## 2024-11-06 LAB — COMPREHENSIVE METABOLIC PANEL WITH GFR
ALT: 8 U/L (ref 0–44)
AST: 17 U/L (ref 15–41)
Albumin: 3.9 g/dL (ref 3.5–5.0)
Alkaline Phosphatase: 75 U/L (ref 38–126)
Anion gap: 9 (ref 5–15)
BUN: 10 mg/dL (ref 8–23)
CO2: 26 mmol/L (ref 22–32)
Calcium: 9.2 mg/dL (ref 8.9–10.3)
Chloride: 104 mmol/L (ref 98–111)
Creatinine, Ser: 0.91 mg/dL (ref 0.61–1.24)
GFR, Estimated: >60 mL/min
Glucose, Bld: 107 mg/dL — ABNORMAL HIGH (ref 70–99)
Potassium: 4.1 mmol/L (ref 3.5–5.1)
Sodium: 139 mmol/L (ref 135–145)
Total Bilirubin: 0.5 mg/dL (ref 0.0–1.2)
Total Protein: 7.4 g/dL (ref 6.5–8.1)

## 2024-11-06 LAB — LACTATE DEHYDROGENASE: LDH: 187 U/L (ref 105–235)

## 2024-11-06 NOTE — Assessment & Plan Note (Addendum)
-   Current hydroxyurea  dose is 1500 mg 4 times per week and 1000 mg 3 times per week. -Tolerating well.  No GI side effects. -Labs from today show platelets 424, MCV 121.7 with normal white blood cell and hemoglobin.  CMP is unremarkable and LDH is normal. -Discussed further increasing hydroxyurea  dose to 1500 mg 5 days/week and 1000 mg 2 days/week. -Return to clinic in 12 weeks for follow-up with labs a few days before.

## 2024-11-06 NOTE — Assessment & Plan Note (Addendum)
-   He was admitted to [facility] in [place]  from 05/29/2024 through 06/07/2021 with septic shock and was also diagnosed with saddle embolism.  He underwent thrombectomy and IVC filter placement.   - He is tolerating Eliquis daily very well.  He is also on Plavix .  Patient to meet with cardiology and neurology to see if he can come off of Plavix . - No bleeding issues reported.  Because of limited mobility, he will continue indefinite anticoagulation.

## 2024-11-06 NOTE — Progress Notes (Signed)
 [facility] OFFICE PROGRESS NOTE  Sheryle Carwin, MD  ASSESSMENT & PLAN:    Assessment & Plan JAK-2 gene mutation - Current hydroxyurea  dose is 1500 mg 4 times per week and 1000 mg 3 times per week. -Tolerating well.  No GI side effects. -Labs from today show platelets 424, MCV 121.7 with normal white blood cell and hemoglobin.  CMP is unremarkable and LDH is normal. -Discussed further increasing hydroxyurea  dose to 1500 mg 5 days/week and 1000 mg 2 days/week. -Return to clinic in 12 weeks for follow-up with labs a few days before. Other acute pulmonary embolism, unspecified whether acute cor pulmonale present [facility]) - He was admitted to [facility] in [place]  from 05/29/2024 through 06/07/2021 with septic shock and was also diagnosed with saddle embolism.  He underwent thrombectomy and IVC filter placement.   - He is tolerating Eliquis daily very well.  He is also on Plavix .  Patient to meet with cardiology and neurology to see if he can come off of Plavix . - No bleeding issues reported.  Because of limited mobility, he will continue indefinite anticoagulation.  Orders Placed This Encounter  Procedures   Lactate dehydrogenase    Standing Status:   Future    Expected Date:   02/06/2025    Expiration Date:   05/07/2025   Comprehensive metabolic panel    Standing Status:   Future    Expected Date:   02/06/2025    Expiration Date:   05/07/2025   CBC with Differential    Standing Status:   Future    Expected Date:   02/06/2025    Expiration Date:   05/07/2025    INTERVAL HISTORY: Patient returns for follow-up for thrombocytosis.  He is currently tolerating and taking hydroxyurea  1500 mg 4 days/week and 1000 mg 3 days/week.   Today, he states that he is doing well overall. His appetite level is at 100%. His energy level is at low. He is accompanied by his wife.  Reports he is doing well.  He continues physical therapy and also is doing personal training 2  days/week.  Feels like he is slowly getting his strength back.   Denies any recent infections.  He is still on a blood thinner and and denies any bleeding, easy bruising or bright red blood per rectum.   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  He is followed by neurology for tremor in left hand.  Has an appointment in January with Dr. Onita.  We reviewed CBC with differential.   SUMMARY OF HEMATOLOGIC HISTORY: Oncology History   No history exists.    1.  JAK2 positive MPN: - Patient seen at the request of Lauraine Oz, FNP - Intermittently elevated platelet count since 2019. - Denies aqua genic pruritus or erythromelalgia's. - Gives history of DVT in the legs in 2019, reportedly treated with 6 months of anticoagulation.  I could not find records. - Mother had polycythemia vera. - JAK2 V6 82F (02/21/2024): Positive, 58.4% - Hydroxyurea  500 mg daily started on 03/13/2024, dose reduced to 1 tablet every other day on 04/11/2024   2. Social/Family History: -Retired from [employer]. 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 Had a spinal stimulator implanted in 2019 that was infected later that year, making him wheelchair bound. Quit smoking after [place]. Chews tobacco. Agent orange exposure.  -Mother had polycythemia vera requiring chemotherapy and phlebotomy, as well as colon cancer. Father had lung  cancer. Brother had prostate cancer. Paternal cousin died of prostate cancer. Maternal grandfather had cancer, type unknown.   3.  Prostate cancer: - TRUS/biopsy (02/2015): 2 cores at the left apex revealed relatively low volume GS 3+3 pattern.  PSA 4.1.  Oncotype DX GPS score 11, favorable/low risk prostate cancer.  Less than 5% chance of having high-grade disease/extraprostatic disease.  He was maintained on observation.   4.  Saddle pulmonary embolism/bilateral DVT: - CT angiogram (06/01/2024): Large saddle embolus with extension  into lobar and segmental branches of both lungs with sign of right heart strain.  Echocardiogram with LVEF 45%, moderately dilated RV and moderate RV systolic dysfunction. - S/p IVC filter retrieval, thrombectomy and IVC filter placement on 06/01/2024. - Started on Eliquis.   CBC    Component Value Date/Time   WBC 10.1 11/06/2024 0929   RBC 4.01 (L) 11/06/2024 0929   HGB 15.6 11/06/2024 0929   HGB 14.2 03/14/2020 1557   HCT 48.8 11/06/2024 0929   HCT 42.7 03/14/2020 1557   PLT 424 (H) 11/06/2024 0929   PLT 340 03/14/2020 1557   MCV 121.7 (H) 11/06/2024 0929   MCV 84 03/14/2020 1557   MCH 38.9 (H) 11/06/2024 0929   MCHC 32.0 11/06/2024 0929   RDW 14.3 11/06/2024 0929   RDW 14.0 03/14/2020 1557   LYMPHSABS 0.8 11/06/2024 0929   LYMPHSABS 1.4 03/14/2020 1557   MONOABS 0.7 11/06/2024 0929   EOSABS 0.3 11/06/2024 0929   EOSABS 0.3 03/14/2020 1557   BASOSABS 0.1 11/06/2024 0929   BASOSABS 0.1 03/14/2020 1557       Latest Ref Rng & Units 11/06/2024    9:29 AM 08/28/2024    1:17 PM 07/17/2024    9:26 AM  CMP  Glucose 70 - 99 mg/dL 892  897  91   BUN 8 - 23 mg/dL 10  10  8    Creatinine 0.61 - 1.24 mg/dL 9.08  8.96  9.15   Sodium 135 - 145 mmol/L 139  139  140   Potassium 3.5 - 5.1 mmol/L 4.1  4.2  3.3   Chloride 98 - 111 mmol/L 104  104  107   CO2 22 - 32 mmol/L 26  25  25    Calcium  8.9 - 10.3 mg/dL 9.2  9.0  8.9   Total Protein 6.5 - 8.1 g/dL 7.4  7.2  6.9   Total Bilirubin 0.0 - 1.2 mg/dL 0.5  0.6  0.9   Alkaline Phos 38 - 126 U/L 75  73  80   AST 15 - 41 U/L 17  16  15    ALT 0 - 44 U/L 8  12  13       Lab Results  Component Value Date   FERRITIN 13 (L) 04/11/2024   VITAMINB12 794 05/09/2018    Vitals:   11/06/24 1043  BP: 118/68  Pulse: 76  Resp: 17  Temp: 98.7 F (37.1 C)  SpO2: 100%     Review of System:  Review of Systems  Constitutional:  Positive for malaise/fatigue.  Neurological:  Positive for tingling and sensory change.    Physical  Exam: Physical Exam Constitutional:      Appearance: Normal appearance.  HENT:     Head: Normocephalic and atraumatic.  Eyes:     Pupils: Pupils are equal, round, and reactive to light.  Cardiovascular:     Rate and Rhythm: Normal rate and regular rhythm.     Heart sounds: Normal heart sounds. No murmur heard. Pulmonary:  Effort: Pulmonary effort is normal.     Breath sounds: Normal breath sounds. No wheezing.  Abdominal:     General: Bowel sounds are normal. There is no distension.     Palpations: Abdomen is soft.     Tenderness: There is no abdominal tenderness.  Musculoskeletal:        General: Normal range of motion.     Cervical back: Normal range of motion.  Skin:    General: Skin is warm and dry.     Findings: No rash.  Neurological:     Mental Status: He is alert and oriented to person, place, and time.     Gait: Gait is intact.  Psychiatric:        Mood and Affect: Mood and affect normal.        Cognition and Memory: Memory normal.        Judgment: Judgment normal.      I spent 20 minutes dedicated to the care of this patient (face-to-face and non-face-to-face) on the date of the encounter to include what is described in the assessment and plan.,  Delon Hope, NP 11/06/2024 12:03 PM

## 2024-11-09 NOTE — Progress Notes (Signed)
 Chart reviewed, agree above plan ?

## 2024-11-12 ENCOUNTER — Ambulatory Visit
Admission: RE | Admit: 2024-11-12 | Discharge: 2024-11-12 | Disposition: A | Discharge status: Home / Self Care | Source: Ambulatory Visit | Attending: Interventional Radiology | Admitting: Interventional Radiology

## 2024-11-12 DIAGNOSIS — I2690 Septic pulmonary embolism without acute cor pulmonale: Secondary | ICD-10-CM

## 2024-11-12 DIAGNOSIS — I824Z3 Acute embolism and thrombosis of unspecified deep veins of distal lower extremity, bilateral: Secondary | ICD-10-CM

## 2024-11-12 DIAGNOSIS — I82409 Acute embolism and thrombosis of unspecified deep veins of unspecified lower extremity: Secondary | ICD-10-CM | POA: Diagnosis not present

## 2024-11-12 DIAGNOSIS — Z8679 Personal history of other diseases of the circulatory system: Secondary | ICD-10-CM
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
## 2024-11-14 ENCOUNTER — Ambulatory Visit
Admission: RE | Admit: 2024-11-14 | Discharge: 2024-11-14 | Disposition: A | Discharge status: Home / Self Care | Source: Ambulatory Visit | Attending: Oncology | Admitting: Oncology

## 2024-11-14 DIAGNOSIS — I824Z3 Acute embolism and thrombosis of unspecified deep veins of distal lower extremity, bilateral: Secondary | ICD-10-CM

## 2024-11-14 DIAGNOSIS — T24311D Burn of third degree of right thigh, subsequent encounter: Secondary | ICD-10-CM | POA: Diagnosis not present

## 2024-11-14 DIAGNOSIS — I2690 Septic pulmonary embolism without acute cor pulmonale: Secondary | ICD-10-CM

## 2024-11-14 DIAGNOSIS — Z8679 Personal history of other diseases of the circulatory system: Secondary | ICD-10-CM

## 2024-11-14 NOTE — Consult Note (Signed)
 Chief Complaint: IVC filter retrieval request  Referring Provider(s): Burns,Jennifer E  Patient Status: DRI Almond Low  History of Present Illness: Cody Velez is a 78 y.o. male who was admitted to [facility] 05-29-2024 through 06-07-2024 with sepsis, bilateral DVTs, and a saddle PE.  During his hospital stay he had a mechanical thrombectomy and a retrievable IVC filter placed.  He does not ambulate (wheelchair bound since 2019) and is currently anticoagulated with Eliquis.  DVT study from 11-12-2024 demonstrates chronic thrombus, but no acute DVT.  I discussed IVC filter retrieval with the patient and his wife.  I discussed moderate sedation, expected outcomes, risks, benefits, and potential of not being successful on the first attempt.  They had numerous questions all of which were answered.  They stated understanding and are interested in having the filter retrieved.    Past Medical History:  Diagnosis Date   Allergic rhinitis    Arthritis    B12 deficiency    Cervicogenic headache    Childhood asthma    Chronic back pain    Coronary atherosclerosis of native coronary artery    Mild nonobstructive CAD at catheterization January 2015   Dementia [facility])    Depression    Essential hypertension    Falls    GERD (gastroesophageal reflux disease)    Glaucoma    History of blood transfusion    History of cerebrovascular disease 07/23/2015   History of kidney stones    History of pneumonia 02/2011   Hyperlipidemia    OSA (obstructive sleep apnea)    CPAP - Dr. Corrie - uses cpap every night   Pneumonia    Prostate cancer [facility])    PTSD (post-traumatic stress disorder)    [place]   Rectal bleeding    Stroke (HCC) 03/2021   Wheelchair bound     Past Surgical History:  Procedure Laterality Date   APPLICATION OF ROBOTIC ASSISTANCE FOR SPINAL PROCEDURE N/A 03/28/2018   Procedure: APPLICATION OF ROBOTIC ASSISTANCE FOR SPINAL PROCEDURE;  Surgeon: Colon Shove, MD;  Location: [facility] OR;  Service: Neurosurgery;  Laterality: N/A;   BACK SURGERY  02/14/2012   lumbar OR #7; today redid L1L2; replaced screws; added bone from hip   BILATERAL KNEE ARTHROSCOPY     COLONOSCOPY  10/15/2008   Dr. Shaaron: tubular adenoma    COLONOSCOPY  12/17/2003   MFM:Wnmfjo rectal and colon   COLONOSCOPY N/A 09/05/2014   tubular adenoma   COLONOSCOPY WITH PROPOFOL  N/A 09/15/2022   Multiple 3-25 mm polyps in ascending colon and cecum, markedly elongated and redundant colon. One polyp removed piecemeal fashion. 6 month surveillance recommended. Tubular adenomas.   COLONOSCOPY WITH PROPOFOL  N/A 02/23/2023   Procedure: COLONOSCOPY WITH PROPOFOL ;  Surgeon: Shaaron Lamar HERO, MD;  Location: [facility] END[facility];  Service: Endoscopy;  Laterality: N/A;  730am, asa 3   CYSTOSCOPY WITH STENT PLACEMENT Right 01/27/2016   Procedure: CYSTOSCOPY WITH STENT PLACEMENT;  Surgeon: Garnette Shack, MD;  Location: [facility] ORS;  Service: Urology;  Laterality: Right;   CYSTOSCOPY/RETROGRADE/URETEROSCOPY/STONE EXTRACTION WITH BASKET Right 01/27/2016   Procedure: CYSTOSCOPY, RIGHT RETROGRADE, RIGHT URETEROSCOPY, STONE EXTRACTION ;  Surgeon: Garnette Shack, MD;  Location: [facility] ORS;  Service: Urology;  Laterality: Right;   ESOPHAGOGASTRODUODENOSCOPY  10/15/2008   Dr Rourk:Schatzki's ring status post dilation and disruption via [address] dilator/ otherwise unremarkable esophagus, small hiatal hernia, multiple fundal gland polyps not manipulated, gastritis, negative H.pylori   ESOPHAGOGASTRODUODENOSCOPY  06/21/2002   [facility]:Dfjoo sliding hiatal hernia  with mild changes of reflux esophagitis limited to gastroesophageal junction.  Noncritical ring at distal esophagus, 3 cm proximal to gastroesophageal junction/Antral gastritis   FACIAL COSMETIC SURGERY  ` 1985   broke face playing softball   FLEXIBLE SIGMOIDOSCOPY N/A 07/28/2022   Procedure: FLEXIBLE SIGMOIDOSCOPY;  Surgeon: Shaaron Lamar HERO, MD;  Location: [facility]  END[facility];  Service: Endoscopy;  Laterality: N/A;   FRACTURE SURGERY     left wrist; broke it; took spur off   HEMOSTASIS CLIP PLACEMENT  02/23/2023   Procedure: HEMOSTASIS CLIP PLACEMENT;  Surgeon: Shaaron Lamar HERO, MD;  [facility]: [facility];  Service: Endoscopy;;   HOLMIUM LASER APPLICATION Right 01/27/2016   Procedure: HOLMIUM LASER APPLICATION;  Surgeon: Garnette Shack, MD;  Location: [facility] ORS;  Service: Urology;  Laterality: Right;   KNEE ARTHROSCOPY Right 05/18/2018   Procedure: PARTIAL MEDIAL MENISECTOMY AND SURGICAL LAVAGE AND CHONDROPLASTY;  Surgeon: Josefina Chew, MD;  Location: [facility] OR;  Service: Orthopedics;  Laterality: Right;   LEFT HEART CATHETERIZATION WITH CORONARY ANGIOGRAM N/A 12/27/2013   Procedure: LEFT HEART CATHETERIZATION WITH CORONARY ANGIOGRAM;  Surgeon: Peter M Jordan, MD;  Location: [facility] CATH LAB;  Service: Cardiovascular;  Laterality: N/A;   neck epidural     POLYPECTOMY  09/15/2022   Procedure: POLYPECTOMY;  Surgeon: Shaaron Lamar HERO, MD;  [facility];  Service: Endoscopy;;   POLYPECTOMY  02/23/2023   Procedure: POLYPECTOMY INTESTINAL;  Surgeon: Shaaron Lamar HERO, MD;  [facility];  Service: Endoscopy;;   POSTERIOR LUMBAR FUSION 4 LEVEL N/A 03/28/2018   Procedure: Thoracic eight -Lumbar two- FIXATION WITH SCREW PLACEMENT, DECOMPRESSION Thoracic ten-Thoracic eleven  FOR OSTEOMYELITIS;  Surgeon: Colon Shove, MD;  Location: [facility] OR;  Service: Neurosurgery;  Laterality: N/A;   SCLEROTHERAPY  09/15/2022   Procedure: SCLEROTHERAPY;  Surgeon: Shaaron Lamar HERO, MD;  [facility];  Service: Endoscopy;;   SHOULDER SURGERY Bilateral    SPINAL CORD STIMULATOR REMOVAL N/A 02/25/2020   Procedure: LUMBAR SPINAL CORD STIMULATOR REMOVAL;  Surgeon: Colon Shove, MD;  Location: [facility];  Service: Neurosurgery;  Laterality: N/A;   TEE WITHOUT CARDIOVERSION N/A 03/21/2018   Procedure: TRANSESOPHAGEAL ECHOCARDIOGRAM (TEE) WITH PROPOFOL ;  Surgeon: Debera Jayson MATSU, MD;  [facility];  Service: Cardiovascular;  Laterality: N/A;    Allergies: Lisinopril   Medications: Prior to Admission medications   Medication Sig Start Date End Date Taking? Authorizing Provider  acetaminophen  (TYLENOL ) 325 MG tablet Take 2 tablets (650 mg total) by mouth every 6 (six) hours as needed for mild pain (pain score 1-3) (or Fever >/= 101). 10/06/23   Pearlean Manus, MD  alfuzosin  (UROXATRAL ) 10 MG 24 hr tablet Take 10 mg by mouth daily with breakfast.    [provider]  apixaban (ELIQUIS) 5 MG TABS tablet TAKE ONE TABLET BY MOUTH TWICE A DAY 08/29/24   Debera Jayson MATSU, MD  ARIPiprazole  (ABILIFY ) 5 MG tablet Take 5 mg by mouth daily.    [provider]  ascorbic acid (VITAMIN C) 500 MG tablet Take 500 mg by mouth 3 (three) times a week.    [provider]  bisoprolol -hydrochlorothiazide (ZIAC) 2.5-6.25 MG tablet Take 1 tablet every day by oral route.    [provider]  Cholecalciferol (VITAMIN D) 50 MCG (2000 UT) CAPS Take 2,000 Units by mouth daily.    [provider]  clopidogrel  (PLAVIX ) 75 MG tablet Take 1 tablet (75 mg total) by mouth daily. 04/08/21   Zackowski, Scott, MD  cyanocobalamin  (  VITAMIN B12) 500 MCG tablet Take 500 mcg by mouth daily.    [provider]  diclofenac Sodium (VOLTAREN) 1 % GEL Apply topically 4 (four) times daily.    [provider]  doxycycline  (VIBRAMYCIN ) 100 MG capsule Take 1 capsule (100 mg total) by mouth every 12 (twelve) hours. Patient not taking: Reported on 11/06/2024 07/11/24   Gerldine Lauraine BROCKS, FNP  finasteride  (PROSCAR ) 5 MG tablet Take 1 tablet (5 mg total) by mouth daily. Reported on 05/04/2016 05/22/18   Krishnan, Gokul, MD  fluticasone  (FLONASE ) 50 MCG/ACT nasal spray Place 1 spray into both nostrils daily as needed for allergies. Patient not taking: Reported on 11/06/2024 05/22/18   Krishnan, Gokul, MD  furosemide  (LASIX ) 20 MG tablet Take 1 tablet  (20 mg total) by mouth daily as needed for edema. Patient not taking: Reported on 11/06/2024 11/03/23   Johnson Laymon HERO, PA-C  furosemide  (LASIX ) 20 MG tablet Take 20 mg by mouth daily. 09/13/24   [provider]  gabapentin  (NEURONTIN ) 100 MG capsule Take 100 mg by mouth at bedtime.    [provider]  hydroxyurea  (HYDREA ) 500 MG capsule Take 3 capsules (1,500 mg total) by mouth every Monday, Wednesday, and Friday AND 2 capsules (1,000 mg total) every Tuesday, Thursday, Saturday, and Sunday. May take with food to minimize GI side effects. 08/28/24   Geofm Delon BRAVO, NP  ketoconazole  (NIZORAL ) 2 % cream Apply 1 Application topically 2 (two) times daily. Patient not taking: Reported on 11/06/2024 07/24/24   Christine Rush, DPM  linaclotide  (LINZESS ) 290 MCG CAPS capsule Take 290 mcg by mouth as needed.    [provider]  megestrol  (MEGACE ) 400 MG/10ML suspension Take 10 mLs (400 mg total) by mouth 2 (two) times daily. Patient taking differently: Take 400 mg by mouth 2 (two) times daily. Taking as needed 04/11/24   Rogers Hai, MD  methocarbamol  (ROBAXIN ) 500 MG tablet Take 500 mg by mouth. 06/26/24   [provider]  metoprolol succinate (TOPROL-XL) 25 MG 24 hr tablet Take 12.5 mg by mouth daily. 07/13/24   [provider]  mupirocin cream (BACTROBAN) 2 % Apply topically. Patient not taking: Reported on 11/06/2024    [provider]  mupirocin ointment (BACTROBAN) 2 % Apply 1 Application topically daily. Patient not taking: Reported on 11/06/2024 09/21/23   [provider]  mupirocin ointment (BACTROBAN) 2 % Apply 1 Application topically daily. 10/15/24   [provider]  oxyCODONE -acetaminophen  (PERCOCET/ROXICET) 5-325 MG tablet Take by mouth. 07/13/24   [provider]  pantoprazole  (PROTONIX ) 40 MG tablet Take 1 tablet by mouth daily. 10/17/23   [provider]  potassium chloride  SA (KLOR-CON  M20) 20  MEQ tablet Take 1 tablet (20 mEq total) by mouth daily for 5 days, THEN 1 tablet (20 mEq total) daily as needed (When you take your lasix ). 08/03/24 11/06/24  Miriam Norris, NP  rosuvastatin  (CRESTOR ) 20 MG tablet Take 20 mg by mouth daily.    [provider]  sacubitril -valsartan  (ENTRESTO ) 97-103 MG Take 1 tablet by mouth 2 (two) times daily. 04/27/24   Miriam Norris, NP  senna-docusate (SENOKOT-S) 8.6-50 MG tablet  06/10/21   [provider]  spironolactone  (ALDACTONE ) 25 MG tablet Take 25 mg by mouth daily. 07/20/23 12/07/24  [provider]  traZODone  (DESYREL ) 50 MG tablet Take 50 mg by mouth at bedtime. 06/26/24   [provider]  venlafaxine  XR (EFFEXOR -XR) 150 MG 24 hr capsule Take 1 capsule (150 mg total) by  mouth 2 (two) times daily. 05/22/18   Verdene Purchase, MD     Family History  Problem Relation Age of Onset   Emphysema Father    Heart failure Father    Lung cancer Father    CAD Father    Colon cancer Mother    Stroke Mother    Breast cancer Mother    Stroke Sister    Heart attack Brother    Dementia Paternal Uncle    Emphysema Maternal Grandmother    Stroke Maternal Grandmother    Asthma Other        grandson   Heart disease Paternal Grandfather    Anesthesia problems Neg Hx    Hypotension Neg Hx    Malignant hyperthermia Neg Hx    Pseudochol deficiency Neg Hx     Social History   Socioeconomic History   Marital status: Widowed    Spouse name: Rojelio   Number of children: 3   Years of education: Not on file   Highest education level: Not on file  Occupational History   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[employer])    Employer: [employer]  Tobacco Use   Smoking status: Former    Current packs/day: 0.00    Types: Cigarettes    Quit date: 12/20/1958    Years since quitting: 65.9   Smokeless tobacco: Current    Types: Chew  Vaping Use   Vaping status: Never Used  Substance and Sexual Activity   Alcohol use: Not Currently    Alcohol/week: 0.0 standard  drinks of alcohol    Comment: couple bottles of wine per month   Drug use: No    Comment: last used marijuana ~ 1969   Sexual activity: Not on file  Other Topics Concern   Not on file  Social History Narrative   Married, 3 daughters; retired   Patient drinks about 1 cup of coffee daily.   Patient is left handed.   Social Drivers of Corporate Investment Banker Strain: Low Risk (07/14/2023)   Received [facility]   Overall Financial Resource Strain (CARDIA)    Difficulty of Paying Living Expenses: Not very hard  Food Insecurity: No Food Insecurity (06/01/2024)   Received from [facility]   Hunger Vital Sign    Within the past 12 months, you worried that your food would run out before you got the money to buy more.: Never true    Within the past 12 months, the food you bought just didn't last and you didn't have money to get more.: Never true  Transportation Needs: No Transportation Needs (06/01/2024)   Received from [facility]   [facility] - Transportation    In the past 12 months, has lack of transportation kept you from medical appointments or from getting medications?: No    In the past 12 months, has lack of transportation kept you from meetings, work, or from getting things needed for daily living?: No  Physical Activity: Not on file  Stress: Not on file  Social Connections: Unknown (05/03/2022)   Received from [facility]   Social Network    Social Network: Not on file     Review of Systems: A 12 point ROS discussed and pertinent positives are indicated in the HPI above.  All other systems are negative.  Review of Systems  All other systems reviewed and are negative.   Vital Signs: BP 135/73 (BP Location: Right Arm, Patient Position: Sitting, Cuff Size: Normal)   Pulse 78 Comment: pt  is in a-fib, pulse between 48 and 81  Temp 97.9 F (36.6 C) (Oral)   Resp 20   SpO2 96%      Physical Exam Constitutional:      Appearance:  Normal appearance. He is obese.  Cardiovascular:     Rate and Rhythm: Normal rate and regular rhythm.  Pulmonary:     Effort: Pulmonary effort is normal.     Breath sounds: Normal breath sounds.  Skin:    General: Skin is warm and dry.  Neurological:     Mental Status: He is alert and oriented to person, place, and time.  Psychiatric:        Mood and Affect: Mood normal.        Behavior: Behavior normal.        Thought Content: Thought content normal.        Judgment: Judgment normal.     Imaging: US  Venous Img Lower Bilateral (DVT) Result Date: 11/12/2024 CLINICAL DATA:  History of DVT with IVC filter in place. EXAM: BILATERAL LOWER EXTREMITY VENOUS DOPPLER ULTRASOUND TECHNIQUE: Gray-scale sonography with graded compression, as well as color Doppler and duplex ultrasound were performed to evaluate the lower extremity deep venous systems from the level of the common femoral vein and including the common femoral, femoral, profunda femoral, popliteal and calf veins including the posterior tibial, peroneal and gastrocnemius veins when visible. The superficial great saphenous vein was also interrogated. Spectral Doppler was utilized to evaluate flow at rest and with distal augmentation maneuvers in the common femoral, femoral and popliteal veins. COMPARISON:  None Available. FINDINGS: RIGHT LOWER EXTREMITY Common Femoral Vein: No evidence of thrombus. Normal compressibility, respiratory phasicity and response to augmentation. Saphenofemoral Junction: No evidence of thrombus. Normal compressibility and flow on color Doppler imaging. Profunda Femoral Vein: No evidence of thrombus. Normal compressibility and flow on color Doppler imaging. Femoral Vein: The femoral vein is not compressible in appears chronically occluded. The vessel is small in caliber and not expanded. Popliteal Vein: No evidence of thrombus. Normal compressibility, respiratory phasicity and response to augmentation. Calf Veins: No  evidence of thrombus. Normal compressibility and flow on color Doppler imaging. Superficial Great Saphenous Vein: No evidence of thrombus. Normal compressibility. Venous Reflux:  None. Other Findings:  None. LEFT LOWER EXTREMITY Common Femoral Vein: No evidence of thrombus. Normal compressibility, respiratory phasicity and response to augmentation. Saphenofemoral Junction: No evidence of thrombus. Normal compressibility and flow on color Doppler imaging. Profunda Femoral Vein: No evidence of thrombus. Normal compressibility and flow on color Doppler imaging. Femoral Vein: No evidence of thrombus. Normal compressibility, respiratory phasicity and response to augmentation. Popliteal Vein: No evidence of thrombus. Normal compressibility, respiratory phasicity and response to augmentation. Calf Veins: No evidence of thrombus. Normal compressibility and flow on color Doppler imaging. Superficial Great Saphenous Vein: No evidence of thrombus. Normal compressibility. Venous Reflux:  None. Other Findings:  None. IMPRESSION: 1. Probable chronic occlusion of the right femoral vein from chronic DVT. 2. No convincing evidence of acute DVT in either lower extremity. Electronically Signed   By: Wilkie Lent M.D.   On: 11/12/2024 13:03    Labs:  CBC: Recent Labs    06/18/24 1345 07/17/24 0926 08/28/24 1317 11/06/24 0929  WBC 12.3* 10.1 11.1* 10.1  HGB 17.9* 16.2 16.1 15.6  HCT 59.7* 54.8* 52.2* 48.8  PLT 794* 452* 428* 424*    COAGS: No results for input(s): INR, APTT in the last 8760 hours.  BMP: Recent Labs    06/18/24 1345  07/17/24 0926 08/28/24 1317 11/06/24 0929  NA 138 140 139 139  K 4.1 3.3* 4.2 4.1  CL 105 107 104 104  CO2 20* 25 25 26   GLUCOSE 97 91 102* 107*  BUN 12 8 10 10   CALCIUM  9.3 8.9 9.0 9.2  CREATININE 0.74 0.84 1.03 0.91  GFRNONAA >60 >60 >60 >60    LIVER FUNCTION TESTS: Recent Labs    06/18/24 1345 07/17/24 0926 08/28/24 1317 11/06/24 0929  BILITOT 0.9 0.9  0.6 0.5  AST 18 15 16 17   ALT 17 13 12 8   ALKPHOS 86 80 73 75  PROT 7.7 6.9 7.2 7.4  ALBUMIN  3.5 3.2* 3.5 3.9    TUMOR MARKERS: No results for input(s): AFPTM, CEA, CA199, CHROMGRNA in the last 8760 hours.  Assessment and Plan:  IVC filter retrieval with moderate sedation from a right jugular vein approach to be scheduled as soon as possible with me.  The patient will remain on Eliquis.    Electronically Signed: Cordella DELENA Banner, MD   11/14/2024, 8:32 AM     I spent a total of  40 Minutes   in face to face in clinical consultation, greater than 50% of which was counseling/coordinating care for IVC filter retrieval and expected outcomes.

## 2024-11-19 ENCOUNTER — Ambulatory Visit (HOSPITAL_COMMUNITY)

## 2024-11-20 DIAGNOSIS — I5022 Chronic systolic (congestive) heart failure: Secondary | ICD-10-CM | POA: Diagnosis not present

## 2024-11-20 DIAGNOSIS — N390 Urinary tract infection, site not specified: Secondary | ICD-10-CM | POA: Diagnosis not present

## 2024-11-20 DIAGNOSIS — Z79899 Other long term (current) drug therapy: Secondary | ICD-10-CM | POA: Diagnosis not present

## 2024-11-20 DIAGNOSIS — D75839 Thrombocytosis, unspecified: Secondary | ICD-10-CM | POA: Diagnosis not present

## 2024-11-22 ENCOUNTER — Telehealth: Payer: Self-pay | Admitting: Cardiology

## 2024-11-22 MED ORDER — SACUBITRIL-VALSARTAN 97-103 MG PO TABS: 1.0000 | ORAL_TABLET | Freq: Two times a day (BID) | ORAL | 2 refills | Status: AC

## 2024-11-22 NOTE — Telephone Encounter (Signed)
 Pt's medication was sent to pt's pharmacy as requested. Confirmation received.

## 2024-11-22 NOTE — Telephone Encounter (Signed)
 *  STAT* If patient is at the pharmacy, call can be transferred to refill team.   1. Which medications need to be refilled? (please list name of each medication and dose if known)   sacubitril -valsartan  (ENTRESTO ) 97-103 MG   2. Which pharmacy/location (including street and city if local pharmacy) is medication to be sent to?  [facility] - , [place] - [address]   3. Do they need a 30 day or 90 day supply? 90 day

## 2024-11-28 ENCOUNTER — Telehealth (HOSPITAL_COMMUNITY): Payer: Self-pay | Admitting: Radiology
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
## 2024-11-28 NOTE — Telephone Encounter (Signed)
 Called pt to schedule IVC filter removal with Dr. Jenna. Left VM for pt to call back. JM

## 2024-11-29 ENCOUNTER — Emergency Department (HOSPITAL_COMMUNITY)
Admission: EM | Admit: 2024-11-29 | Discharge: 2024-11-29 | Disposition: A | Discharge status: Home / Self Care | Source: Ambulatory Visit | Attending: Emergency Medicine | Admitting: Emergency Medicine

## 2024-11-29 ENCOUNTER — Ambulatory Visit (HOSPITAL_COMMUNITY): Payer: Self-pay | Attending: Internal Medicine

## 2024-11-29 ENCOUNTER — Encounter (HOSPITAL_COMMUNITY): Payer: Self-pay

## 2024-11-29 ENCOUNTER — Other Ambulatory Visit: Payer: Self-pay

## 2024-11-29 DIAGNOSIS — M6281 Muscle weakness (generalized): Secondary | ICD-10-CM

## 2024-11-29 DIAGNOSIS — I69351 Hemiplegia and hemiparesis following cerebral infarction affecting right dominant side: Secondary | ICD-10-CM | POA: Diagnosis not present

## 2024-11-29 DIAGNOSIS — N3000 Acute cystitis without hematuria: Secondary | ICD-10-CM | POA: Diagnosis not present

## 2024-11-29 DIAGNOSIS — Z7901 Long term (current) use of anticoagulants: Secondary | ICD-10-CM | POA: Insufficient documentation

## 2024-11-29 DIAGNOSIS — M25551 Pain in right hip: Secondary | ICD-10-CM | POA: Insufficient documentation

## 2024-11-29 DIAGNOSIS — R262 Difficulty in walking, not elsewhere classified: Secondary | ICD-10-CM

## 2024-11-29 DIAGNOSIS — N390 Urinary tract infection, site not specified: Secondary | ICD-10-CM | POA: Diagnosis not present

## 2024-11-29 DIAGNOSIS — I2699 Other pulmonary embolism without acute cor pulmonale: Secondary | ICD-10-CM | POA: Diagnosis not present

## 2024-11-29 DIAGNOSIS — R2689 Other abnormalities of gait and mobility: Secondary | ICD-10-CM

## 2024-11-29 DIAGNOSIS — I5020 Unspecified systolic (congestive) heart failure: Secondary | ICD-10-CM | POA: Diagnosis not present

## 2024-11-29 DIAGNOSIS — B965 Pseudomonas (aeruginosa) (mallei) (pseudomallei) as the cause of diseases classified elsewhere: Secondary | ICD-10-CM | POA: Diagnosis not present

## 2024-11-29 MED ORDER — TOBRAMYCIN SULFATE 1.2 GM/30ML IJ SOLN
7.0000 mg/kg | Freq: Once | INTRAVENOUS | Status: AC
  Administered 2024-11-29: 702.8 mg via INTRAVENOUS
  Filled 2024-11-29: qty 17.57

## 2024-11-29 MED ORDER — TOBRAMYCIN SULFATE 1.2 GM/30ML IJ SOLN
7.0000 mg/kg | Freq: Once | INTRAVENOUS | Status: DC
  Filled 2024-11-29: qty 17.5

## 2024-11-29 NOTE — ED Triage Notes (Signed)
 Pt arrived via POV from home reporting he received a phone call from Dr Sheryle advising him to come to the ER to receiving IV antibiotics due to a recent urine specimen showing an infection that is untreatable by oral antibiotics. Pt reports pain with urination, denies recent hematuria.

## 2024-11-29 NOTE — Discharge Instructions (Signed)
 After discussing your care with the pharmacist in detail they recommended a single dose of IV medication which she has given you.  You will not need to come back for any more antibiotics.  I would strongly recommend that you follow-up with your family doctor for recheck within 2 weeks for a recheck on the urine.  Return to the ER for severe or worsening symptoms

## 2024-11-29 NOTE — ED Provider Notes (Addendum)
 [place] EMERGENCY DEPARTMENT AT [facility] Provider Note   CSN: [id number] Arrival date & time: 11/29/24  1207     Patient presents with: Recurrent UTI   Cody Velez is a 78 y.o. male.   HPI    This patient is a very pleasant 78 year old male, he is on Eliquis, presents to the hospital today with a complaint of some mild urinary discomfort which he describes as a stabbing feeling in his penis when he urinates.  He has been seen by his family doctor in the outpatient clinic, urinalysis revealed abnormal findings and a culture grew Pseudomonas, up to 50,000 colony-forming units and was sensitive to tobramycin.  The family doctor Dr. Sheryle had called me yesterday, the patient did not come to the ER yesterday but decided to come today and states that he does not have fevers chills nausea vomiting abdominal pain back pain or any other complaints.  Pharmacy and infectious disease were consulted and recommended a single dose of IV tobramycin  Prior to Admission medications  Medication Sig Start Date End Date Taking? Authorizing Provider  acetaminophen  (TYLENOL ) 325 MG tablet Take 2 tablets (650 mg total) by mouth every 6 (six) hours as needed for mild pain (pain score 1-3) (or Fever >/= 101). 10/06/23   Pearlean Manus, MD  alfuzosin  (UROXATRAL ) 10 MG 24 hr tablet Take 10 mg by mouth daily with breakfast.    [provider]  apixaban (ELIQUIS) 5 MG TABS tablet TAKE ONE TABLET BY MOUTH TWICE A DAY 08/29/24   Debera Jayson MATSU, MD  ARIPiprazole  (ABILIFY ) 5 MG tablet Take 5 mg by mouth daily.    [provider]  ascorbic acid (VITAMIN C) 500 MG tablet Take 500 mg by mouth 3 (three) times a week.    [provider]  bisoprolol -hydrochlorothiazide (ZIAC) 2.5-6.25 MG tablet Take 1 tablet every day by oral route.    [provider]  Cholecalciferol (VITAMIN D) 50 MCG (2000 UT) CAPS Take 2,000 Units by mouth daily.    [provider]   clopidogrel  (PLAVIX ) 75 MG tablet Take 1 tablet (75 mg total) by mouth daily. 04/08/21   Zackowski, Scott, MD  cyanocobalamin  (VITAMIN B12) 500 MCG tablet Take 500 mcg by mouth daily.    [provider]  diclofenac Sodium (VOLTAREN) 1 % GEL Apply topically 4 (four) times daily.    [provider]  doxycycline  (VIBRAMYCIN ) 100 MG capsule Take 1 capsule (100 mg total) by mouth every 12 (twelve) hours. Patient not taking: Reported on 11/06/2024 07/11/24   Gerldine Lauraine BROCKS, FNP  finasteride  (PROSCAR ) 5 MG tablet Take 1 tablet (5 mg total) by mouth daily. Reported on 05/04/2016 05/22/18   Krishnan, Gokul, MD  fluticasone  (FLONASE ) 50 MCG/ACT nasal spray Place 1 spray into both nostrils daily as needed for allergies. Patient not taking: Reported on 11/06/2024 05/22/18   Krishnan, Gokul, MD  furosemide  (LASIX ) 20 MG tablet Take 1 tablet (20 mg total) by mouth daily as needed for edema. Patient not taking: Reported on 11/06/2024 11/03/23   Johnson Laymon HERO, PA-C  furosemide  (LASIX ) 20 MG tablet Take 20 mg by mouth daily. 09/13/24   [provider]  gabapentin  (NEURONTIN ) 100 MG capsule Take 100 mg by mouth at bedtime.    [provider]  hydroxyurea  (HYDREA ) 500 MG capsule Take 3 capsules (1,500 mg total) by mouth every Monday, Wednesday, and Friday AND 2 capsules (1,000 mg total) every Tuesday, Thursday, Saturday, and Sunday. May take with  food to minimize GI side effects. 08/28/24   Geofm Delon BRAVO, NP  ketoconazole  (NIZORAL ) 2 % cream Apply 1 Application topically 2 (two) times daily. Patient not taking: Reported on 11/06/2024 07/24/24   Christine Rush, DPM  linaclotide  (LINZESS ) 290 MCG CAPS capsule Take 290 mcg by mouth as needed.    [provider]  megestrol  (MEGACE ) 400 MG/10ML suspension Take 10 mLs (400 mg total) by mouth 2 (two) times daily. Patient taking differently: Take 400 mg by mouth 2 (two) times daily. Taking as needed 04/11/24   Rogers Hai,  MD  methocarbamol  (ROBAXIN ) 500 MG tablet Take 500 mg by mouth. 06/26/24   [provider]  metoprolol succinate (TOPROL-XL) 25 MG 24 hr tablet Take 12.5 mg by mouth daily. 07/13/24   [provider]  mupirocin cream (BACTROBAN) 2 % Apply topically. Patient not taking: Reported on 11/06/2024    [provider]  mupirocin ointment (BACTROBAN) 2 % Apply 1 Application topically daily. Patient not taking: Reported on 11/06/2024 09/21/23   [provider]  mupirocin ointment (BACTROBAN) 2 % Apply 1 Application topically daily. 10/15/24   [provider]  oxyCODONE -acetaminophen  (PERCOCET/ROXICET) 5-325 MG tablet Take by mouth. 07/13/24   [provider]  pantoprazole  (PROTONIX ) 40 MG tablet Take 1 tablet by mouth daily. 10/17/23   [provider]  potassium chloride  SA (KLOR-CON  M20) 20 MEQ tablet Take 1 tablet (20 mEq total) by mouth daily for 5 days, THEN 1 tablet (20 mEq total) daily as needed (When you take your lasix ). 08/03/24 11/06/24  Miriam Norris, NP  rosuvastatin  (CRESTOR ) 20 MG tablet Take 20 mg by mouth daily.    [provider]  sacubitril -valsartan  (ENTRESTO ) 97-103 MG Take 1 tablet by mouth 2 (two) times daily. 11/22/24   Miriam Norris, NP  senna-docusate (SENOKOT-S) 8.6-50 MG tablet  06/10/21   [provider]  spironolactone  (ALDACTONE ) 25 MG tablet Take 25 mg by mouth daily. 07/20/23 12/07/24  [provider]  traZODone  (DESYREL ) 50 MG tablet Take 50 mg by mouth at bedtime. 06/26/24   [provider]  venlafaxine  XR (EFFEXOR -XR) 150 MG 24 hr capsule Take 1 capsule (150 mg total) by mouth 2 (two) times daily. 05/22/18   Krishnan, Gokul, MD    Allergies: Lisinopril     Review of Systems  All other systems reviewed and are negative.   Updated Vital Signs BP 136/84 (BP Location: Right Arm)   Pulse 76   Temp 98 F (36.7 C) (Oral)   Resp 16   Ht 1.905 m (6' 3)   Wt 124.3 kg   SpO2 96%    BMI 34.25 kg/m   Physical Exam Vitals and nursing note reviewed.  Constitutional:      General: He is not in acute distress.    Appearance: He is well-developed.  HENT:     Head: Normocephalic and atraumatic.     Mouth/Throat:     Pharynx: No oropharyngeal exudate.  Eyes:     General: No scleral icterus.       Right eye: No discharge.        Left eye: No discharge.     Conjunctiva/sclera: Conjunctivae normal.     Pupils: Pupils are equal, round, and reactive to light.  Neck:     Thyroid : No thyromegaly.     Vascular: No JVD.  Cardiovascular:     Rate and Rhythm: Normal rate and regular rhythm.     Heart sounds: Normal heart sounds. No murmur heard.  No friction rub. No gallop.  Pulmonary:     Effort: Pulmonary effort is normal. No respiratory distress.     Breath sounds: Normal breath sounds. No wheezing or rales.  Abdominal:     General: Bowel sounds are normal. There is no distension.     Palpations: Abdomen is soft. There is no mass.     Tenderness: There is no abdominal tenderness.  Musculoskeletal:        General: No tenderness. Normal range of motion.     Cervical back: Normal range of motion and neck supple.     Right lower leg: No edema.     Left lower leg: No edema.  Lymphadenopathy:     Cervical: No cervical adenopathy.  Skin:    General: Skin is warm and dry.     Findings: No erythema or rash.  Neurological:     Mental Status: He is alert.     Coordination: Coordination normal.  Psychiatric:        Behavior: Behavior normal.     (all labs ordered are listed, but only abnormal results are displayed) Labs Reviewed - No data to display  EKG: None  Radiology: No results found.   Procedures   Medications Ordered in the ED  tobramycin Sulfate 702.8 mg in dextrose  5 % 50 mL IVPB (has no administration in time range)                                    Medical Decision Making   lalThis patient presents to the ED for concern of UTI  differential diagnosis includes recent UTI    Additional history obtained   Additional history obtained from Electronic Medical Record External records from outside source obtained and reviewed including family doctor phone call   Lab Tests:  I Ordered, and personally interpreted labs.  The pertinent results include: I reviewed the patient's outpatient laboratory culture which was sent with the patient, it does in fact have a sensitivity to tobramycin   Medicines ordered and prescription drug management:  I ordered medication including tobramycin IV    I have reviewed the patients home medicines and have made adjustments as needed   Problem List / ED Course:  Patient will be treated with tobramycin after consultation with our pharmacist here onsite they recommend a single dose of 7 mg/kg of his ideal body weight and they will get that ordered   Social Determinants of Health:  Wheelchair   Consultations I did speak with infectious disease Dr. Fleeta Rothman, he is in agreement that a single dose of tobramycin is more than enough for this patient.  And that he does not need daily tobramycin     Final diagnoses:  Acute cystitis without hematuria    ED Discharge Orders     None          Cleotilde Rogue, MD 11/29/24 1319    Cleotilde Rogue, MD 11/29/24 1504

## 2024-12-06 ENCOUNTER — Telehealth (HOSPITAL_COMMUNITY): Payer: Self-pay

## 2024-12-06 NOTE — Telephone Encounter (Signed)
 LMOM for pt to call back.

## 2024-12-07 ENCOUNTER — Telehealth: Payer: Self-pay | Admitting: Cardiology

## 2024-12-07 MED ORDER — FUROSEMIDE 20 MG PO TABS: 20.0000 mg | ORAL_TABLET | Freq: Every day | ORAL | 0 refills | Status: DC | PRN

## 2024-12-07 NOTE — Addendum Note (Signed)
 Addended by: DRENA MARTINIS, Lorena Benham L on: 12/07/2024 04:49 PM   Modules accepted: Orders

## 2024-12-07 NOTE — Telephone Encounter (Signed)
" °*  STAT* If patient is at the pharmacy, call can be transferred to refill team.   1. Which medications need to be refilled? (please list name of each medication and dose if known) furosemide  (LASIX ) 20 MG tablet    2. Would you like to learn more about the convenience, safety, & potential cost savings by using the [facility]? No   3. Are you open to using the [facility] (Type [facility].)   4. Which pharmacy/location (including street and city if local pharmacy) is medication to be sent to?  [facility] - [place], [place] - [address]    5. Do they need a 30 day or 90 day supply? 90  PT is out of medication "

## 2024-12-07 NOTE — Telephone Encounter (Signed)
 Sent in refill of Lasix  20 prn daily for edema. Patient also reports taking lasix  20 mg daily. Patient sees Almarie Crate, NP in January. Will send message to provider so she can address which way should would like patient to be taking his lasix . Sent in 30 day supply for PRN daily for edema.

## 2024-12-10 ENCOUNTER — Emergency Department (HOSPITAL_COMMUNITY)

## 2024-12-11 ENCOUNTER — Ambulatory Visit (HOSPITAL_COMMUNITY)

## 2024-12-11 DIAGNOSIS — R2689 Other abnormalities of gait and mobility: Secondary | ICD-10-CM

## 2024-12-11 DIAGNOSIS — R262 Difficulty in walking, not elsewhere classified: Secondary | ICD-10-CM

## 2024-12-11 DIAGNOSIS — M6281 Muscle weakness (generalized): Secondary | ICD-10-CM

## 2024-12-11 DIAGNOSIS — M25551 Pain in right hip: Secondary | ICD-10-CM

## 2024-12-11 MED ORDER — FUROSEMIDE 20 MG PO TABS: 20.0000 mg | ORAL_TABLET | Freq: Every day | ORAL | 3 refills | Status: AC

## 2024-12-11 NOTE — Telephone Encounter (Signed)
 Sent in as prescribed by Almarie Crate, NP.  Thanks for the update. Chart reviewed. Looks like he has better control of his swelling with this dosing. Okay to change Lasix  to 20 mg daily and most recent labs on file show normal kidney function.    Thanks!   Best, Almarie Crate, NP

## 2024-12-11 NOTE — Addendum Note (Signed)
 Addended by: DRENA MARTINIS, Cesario Weidinger L on: 12/11/2024 12:49 PM   Modules accepted: Orders
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
## 2024-12-17 ENCOUNTER — Encounter: Payer: Self-pay | Admitting: *Deleted

## 2024-12-27 ENCOUNTER — Ambulatory Visit (HOSPITAL_COMMUNITY): Attending: Internal Medicine

## 2024-12-27 ENCOUNTER — Encounter (HOSPITAL_COMMUNITY): Payer: Self-pay

## 2024-12-27 DIAGNOSIS — M6281 Muscle weakness (generalized): Secondary | ICD-10-CM | POA: Diagnosis present

## 2024-12-27 DIAGNOSIS — R262 Difficulty in walking, not elsewhere classified: Secondary | ICD-10-CM | POA: Diagnosis present

## 2024-12-27 DIAGNOSIS — R2689 Other abnormalities of gait and mobility: Secondary | ICD-10-CM | POA: Diagnosis present

## 2024-12-27 DIAGNOSIS — M25551 Pain in right hip: Secondary | ICD-10-CM | POA: Insufficient documentation
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
## 2024-12-28 ENCOUNTER — Ambulatory Visit: Attending: Nurse Practitioner | Admitting: Nurse Practitioner

## 2024-12-28 ENCOUNTER — Encounter: Payer: Self-pay | Admitting: Nurse Practitioner

## 2024-12-28 VITALS — BP 116/72 | HR 82 | Ht 75.0 in | Wt 283.0 lb

## 2024-12-28 DIAGNOSIS — G4733 Obstructive sleep apnea (adult) (pediatric): Secondary | ICD-10-CM | POA: Insufficient documentation

## 2024-12-28 DIAGNOSIS — Z8673 Personal history of transient ischemic attack (TIA), and cerebral infarction without residual deficits: Secondary | ICD-10-CM | POA: Diagnosis present

## 2024-12-28 DIAGNOSIS — I10 Essential (primary) hypertension: Secondary | ICD-10-CM | POA: Insufficient documentation

## 2024-12-28 DIAGNOSIS — I2510 Atherosclerotic heart disease of native coronary artery without angina pectoris: Secondary | ICD-10-CM | POA: Insufficient documentation

## 2024-12-28 DIAGNOSIS — I5189 Other ill-defined heart diseases: Secondary | ICD-10-CM | POA: Insufficient documentation

## 2024-12-28 DIAGNOSIS — I2602 Saddle embolus of pulmonary artery with acute cor pulmonale: Secondary | ICD-10-CM | POA: Diagnosis present

## 2024-12-28 DIAGNOSIS — I824Z3 Acute embolism and thrombosis of unspecified deep veins of distal lower extremity, bilateral: Secondary | ICD-10-CM | POA: Diagnosis present

## 2024-12-28 DIAGNOSIS — M79604 Pain in right leg: Secondary | ICD-10-CM | POA: Diagnosis present

## 2024-12-28 DIAGNOSIS — I491 Atrial premature depolarization: Secondary | ICD-10-CM | POA: Insufficient documentation

## 2024-12-28 DIAGNOSIS — M79605 Pain in left leg: Secondary | ICD-10-CM | POA: Diagnosis present

## 2024-12-28 DIAGNOSIS — I493 Ventricular premature depolarization: Secondary | ICD-10-CM | POA: Diagnosis present

## 2024-12-28 DIAGNOSIS — I5020 Unspecified systolic (congestive) heart failure: Secondary | ICD-10-CM | POA: Diagnosis present

## 2024-12-28 DIAGNOSIS — Z01818 Encounter for other preprocedural examination: Secondary | ICD-10-CM | POA: Diagnosis not present

## 2024-12-28 NOTE — Patient Instructions (Addendum)
 Medication Instructions:   Continue all current medications.   Labwork:  none  Testing/Procedures:  none  Follow-Up:  3-4  months   Any Other Special Instructions Will Be Listed Below (If Applicable).  CHF info given   If you need a refill on your cardiac medications before your next appointment, please call your pharmacy.

## 2024-12-28 NOTE — Progress Notes (Unsigned)
 " Cardiology Office Note:  .   Date: 12/28/2024 ID:  Cody Velez, DOB 07-Oct-1946, MRN [id number] PCP: Sheryle Carwin, MD  [facility] Providers Cardiologist:  Jayson Sierras, MD    History of Present Illness: .   Cody Velez is a 79 y.o. male with a PMH of HFrEF, HTN, CAD, frequent PVC's, hx of CVA, and OSA on CPAP, who presents today for 31-month follow-up appointment.  Heart healthy diet and regular cardiovascular exercise encouraged.  TTE revealed EF 30 to 35%, global hypokinesis, felt to be NICM.  Last seen by Dr. Sierras on December 08, 2023.  At the time, was seeing [facility] and [facility] cardiology.  Patient mention his preference to continue with [facility] for his cardiac needs.  Noted difficult time weighing at home but noted improvement with leg swelling while taking Lasix  daily.   TTE in March 2025 revealed EF 35 to 40%, global hypokinesis of left ventricle, grade 1 DD, no significant valvular abnormalities.  04/27/2024 - Today he presents for follow-up.  He states he is doing well. Denies any acute cardiac complaints or issues. Denies any chest pain, shortness of breath, palpitations, syncope, presyncope, dizziness, orthopnea, PND, swelling or significant weight changes, acute bleeding, or claudication.  He was hospitalized in June 2025 in [place] , was visiting family, when he presentsed to [facility] ED for evaluation of AMS, hypotension and presented with sepsis/septic shock. EKG on arrival showed ST, RBBB, with prolonged QTC. CXR revelaed indeterminate masslike opacity at medial base of right hemithorax, wide mediastinum and cardiomegaly and a tortuous aorta.  He was admitted to the ICU.  Venous Doppler revealed bilateral acute DVT on the right leg within the common femoral, femoral and popliteal veins and on the left within the popliteal and posterior tibial venous segment.  He was started on heparin , IVC filter was placed due to his immobility and high risk for  hypercoagulability.  After patient was transferred from ICU, he continued to complain of shortness of breath and chest pains.  CTA of chest showed large saddle embolus with extension into the lobar and segmental branches of the right and left lungs, with evidence of heart strain.  Echocardiogram revealed EF 45%, moderately dilated RV and moderate RV systolic dysfunction.  He was transferred back to the ICU, IR consulted for thrombectomy.  Underwent IVC filter retrieval, thrombectomy, and IVC filter placement.  He was eventually transitioned to oral Eliquis.  ID was following patient for Pseudomonas UTI.  Patient was to follow-up with primary oncologist and have repeat echo.  He was transferred to [facility] for rehab in [place]. Now following Hem/Onc at [facility] and diagnosed with JAK2 positive MPN.   08/03/2024 - Today he presents for scheduled follow-up. Patient and wife update me as to most recent hospital stay and rehab stay as noted above. He is doing well. Denies any chest pain, shortness of breath, palpitations, syncope, presyncope, dizziness, orthopnea, PND, significant weight changes, acute bleeding, or claudication. Swelling is well controlled and wearing bilateral compression stockings to BLE. Tolerating his medications well. Now doing physical therapy.  12/28/2024 - He is here for follow-up with his wife and doing much better since I last saw him. He is scheduled to have his IVC filter removed on 01/04/2025. He is working with physical therapy twice per week. Does admit to some upper leg pain, says his therapist thinks this may be due to how he is probably lying in the bed. Otherwise doing well. Denies any chest  pain, shortness of breath, palpitations, syncope, presyncope, dizziness, orthopnea, PND, swelling or significant weight changes, acute bleeding, or claudication.   ROS: Negative. See HPI.  SH: Education Administrator, former retail banker.   He has chronic weakness and is wheelchair bound  since before his stroke and happened with past infection from spinal stimulator and infection settled in his spine and affected his leg movement.   Studies Reviewed: SABRA    EKG: EKG is not ordered today.   Venous ultrasound 10/2024:    IMPRESSION: 1. Probable chronic occlusion of the right femoral vein from chronic DVT. 2. No convincing evidence of acute DVT in either lower extremity.    Echo 05/2024 [facility]):  Interpretation Summary  A two-dimensional transthoracic echocardiogram with color flow and Doppler was performed in limited views only.  The study was technically difficult with many images being suboptimal in quality.  Images were not obtained from all of the standard acoustic windows due to the limited scope of the study.  LVEF is grossly mildly depressed in the 45% region but with limited images that are extremely poor quality.  The right ventricle is moderately dilated.  The right ventricular systolic function is moderately reduced.  No clear evidence of pericardial effusion based on limited window ( no subcostal views).  Echo 02/2024:  1. Left ventricular ejection fraction, by estimation, is 35 to 40%. The  left ventricle has moderately decreased function. The left ventricle  demonstrates global hypokinesis. Left ventricular diastolic parameters are consistent with Grade I diastolic dysfunction (impaired relaxation). The average left ventricular global longitudinal strain is -11.6 %. The global longitudinal strain is abnormal.   2. Right ventricular systolic function is normal. The right ventricular  size is normal. Tricuspid regurgitation signal is inadequate for assessing  PA pressure.   3. The mitral valve is normal in structure. No evidence of mitral valve  regurgitation. No evidence of mitral stenosis.   4. The aortic valve is tricuspid. Aortic valve regurgitation is not  visualized. No aortic stenosis is present.   5. The inferior vena cava is normal  in size with greater than 50%  respiratory variability, suggesting right atrial pressure of 3 mmHg.   Comparison(s): A prior study was performed on 10/03/2023. TDS-EF 30-35%. Moderate LVH.  Cardiac monitor 08/2023:  ZIO XT reviewed.  2 days, 23 hours analyzed.   Predominant rhythm is sinus with heart rate ranging from 47 bpm up to 103 bpm and average heart rate 65 bpm. There were frequent PACs representing approximately 39% total beats with otherwise occasional atrial couplets and rare atrial triplets. There were occasional PVCs representing approximately 3% total beats with otherwise rare ventricular couplets and triplets as well as limited episodes of ventricular bigeminy and trigeminy. Two brief episodes of NSVT were noted, the longest of which was only 4 beats. There were also 5 episodes of PSVT, the longest of which was only 8 beats. No sustained arrhythmias, pauses, or heart block noted.  TEE 03/2018:  Study Conclusions   - Procedure narrative: Transesophageal echocardiography. Topical    anesthesia was obtained using viscous lidocaine . A    transesophageal probe was inserted by the attending cardiologist.    Image quality was poor. This was a technically difficult study.  - Left ventricle: Systolic function was normal. The estimated    ejection fraction was in the range of 55% to 60%.  - Descending aorta: The descending aorta had moderate diffuse    disease.  - Mitral valve: There was trivial  regurgitation.  - Left atrium: No evidence of thrombus in the atrial cavity or    appendage.  - Tricuspid valve: There was trivial regurgitation.  - Pulmonic valve: There was trivial regurgitation.   Impressions:   - Images were overall limited. Valves best visualized with    nonstandard views. There were no obvious valvular vegetations.  Lexiscan  02/2018:  There was no ST segment deviation noted during stress. The study is normal. This is a low risk study. The left ventricular  ejection fraction is normal (55-65%).   Normal resting and stress perfusion.   No RWMA;s EF 57%   Physical Exam:   VS:  BP 116/72   Pulse 82   Ht 6' 3 (1.905 m)   Wt 283 lb (128.4 kg)   SpO2 95%   BMI 35.37 kg/m    Wt Readings from Last 3 Encounters:  12/28/24 283 lb (128.4 kg)  11/29/24 274 lb 0.5 oz (124.3 kg)  11/06/24 274 lb (124.3 kg)    GEN: Obese, 79 y.o. male in no acute distress NECK: No JVD; No carotid bruits CARDIAC: S1/S2, irregular rhythm, no murmurs, rubs, gallops RESPIRATORY:  Clear to auscultation without rales, wheezing or rhonchi  ABDOMEN: Soft, non-tender, non-distended EXTREMITIES:  No edema; No deformity   ASSESSMENT AND PLAN: .    HFmrEF, RV systolic dysfunction Stage C, NYHA class I-II symptoms. EF 45%, 05/2024, improved from previous study. Euvolemic and well compensated on exam, currently wearing compression stockings bilaterally.  Continue current medication regimen. Low sodium diet, fluid restriction <2L, and daily weights encouraged. Educated to contact our office for weight gain of 2 lbs overnight or 5 lbs in one week. Continue PT.   2. Acute saddle PE with heart strain/ acute bilateral DVT's Doing well post IVC filter placement and thrombectomy - see details in HPI. Will be getting filter soon removed - see below for clearance. Denies any chest pain or shortness of breath. Continue Eliquis and continue to follow-up with Hem/Onc. Care and ED precautions discussed.   3. CAD Heart catheterization in 2015 showed mild nonobstructive CAD. Stable with no anginal symptoms. No indication for ischemic evaluation. Continue current medication regimen. Heart healthy diet and regular cardiovascular exercise encouraged.   4. HTN BP stable. Discussed to monitor BP at home at least 2 hours after medications and sitting for 5-10 minutes. Goal SBP < 140.  No medication changes at this time.  Heart healthy diet and regular cardiovascular exercise encouraged.   5.  Frequent PVC's and PAC's  He is asymptomatic with this.  Continue Toprol-XL. Heart healthy diet and regular cardiovascular exercise encouraged.   6. OSA on CPAP Encouraged continued compliance.  7. Hx of CVA Denies any issues.  Continue Plavix . Continue to follow with PCP.  8. Leg pain Does not sound ischemic in nature as I reviewed most recent vascular study done last year  - noted above. Continue to follow with PCP and PT.   8. Pre-op clearance Mr. Udell perioperative risk of a major cardiac event is 6.6% according to the Revised Cardiac Risk Index (RCRI).  Therefore, he is at high risk for perioperative complications.   His functional capacity is poor at 3.84 METs according to the Duke Activity Status Index (DASI). Recommendations: The patient is at high risk for perioperative cardiac complications and is at a low functional capacity.  However, further testing will not change how his cardiac status is managed.  Proceed with surgery at high risk if there are no other options  for treatment. Antiplatelet and/or Anticoagulation Recommendations: Consulted clinical pharm D about this case. Also consulted IR MD, Dr. Jenna who has stated that Eliquis does not need to be held prior to procedure. I called patient back and let him know and patient verbalized understanding.   Dispo: Follow-up with Dr. Debera or [facility] in 3-4 months or sooner if anything changes.   Signed, Almarie Crate, NP   "

## 2025-01-01 ENCOUNTER — Ambulatory Visit (HOSPITAL_COMMUNITY)

## 2025-01-03 ENCOUNTER — Other Ambulatory Visit (HOSPITAL_COMMUNITY): Payer: Self-pay | Admitting: Student

## 2025-01-03 ENCOUNTER — Encounter (HOSPITAL_COMMUNITY): Payer: Self-pay

## 2025-01-03 ENCOUNTER — Ambulatory Visit (HOSPITAL_COMMUNITY)

## 2025-01-03 DIAGNOSIS — R262 Difficulty in walking, not elsewhere classified: Secondary | ICD-10-CM | POA: Diagnosis not present

## 2025-01-03 DIAGNOSIS — M25551 Pain in right hip: Secondary | ICD-10-CM

## 2025-01-03 DIAGNOSIS — M6281 Muscle weakness (generalized): Secondary | ICD-10-CM

## 2025-01-03 DIAGNOSIS — R2689 Other abnormalities of gait and mobility: Secondary | ICD-10-CM

## 2025-01-03 NOTE — H&P (Addendum)
 "      Chief Complaint: Patient was seen in consultation Velez for pulmonary embolism  Referring Physician(s): Burns,Jennifer E  Supervising Physician: Hughes Simmonds  Patient Status: [facility] - Out-pt  History of Present Illness: Cody Velez is a 79 y.o. male who was admitted to [facility] 05-29-2024 through 06-07-2024 with sepsis, bilateral DVTs, and a saddle PE at which time he had a mechanical thrombectomy and a retrievable IVC filter placed.   He has since initated long-term anticoagulation with tolerance.  He was recently seen in consultation with Dr. Jenna 11/14/24 to discuss IVC filter retrieval.  After discussion, he has elected to proceed.    Cody Velez in his usual state of health.  His caregiver is available for post-procedure care and transportation.  He is aware of goals, risks, and benefits and is agreeable to proceed.   He is FULL CODE.    Past Medical History:  Diagnosis Date   Allergic rhinitis    Arthritis    B12 deficiency    Cervicogenic headache    Childhood asthma    Chronic back pain    Coronary atherosclerosis of native coronary artery    Mild nonobstructive CAD at catheterization January 2015   Dementia [facility])    Depression    Essential hypertension    Falls    GERD (gastroesophageal reflux disease)    Glaucoma    History of blood transfusion    History of cerebrovascular disease 07/23/2015   History of kidney stones    History of pneumonia 02/2011   Hyperlipidemia    OSA (obstructive sleep apnea)    CPAP - Dr. Corrie - uses cpap every night   Pneumonia    Prostate cancer [facility])    PTSD (post-traumatic stress disorder)    [place]   Rectal bleeding    Stroke (HCC) 03/2021   Wheelchair bound     Past Surgical History:  Procedure Laterality Date   APPLICATION OF ROBOTIC ASSISTANCE FOR SPINAL PROCEDURE N/A 03/28/2018   Procedure: APPLICATION OF ROBOTIC ASSISTANCE FOR SPINAL PROCEDURE;  Surgeon: Colon Shove, MD;   Location: [facility] OR;  Service: Neurosurgery;  Laterality: N/A;   BACK SURGERY  02/14/2012   lumbar OR #7; Velez redid L1L2; replaced screws; added bone from hip   BILATERAL KNEE ARTHROSCOPY     COLONOSCOPY  10/15/2008   Dr. Shaaron: tubular adenoma    COLONOSCOPY  12/17/2003   MFM:Wnmfjo rectal and colon   COLONOSCOPY N/A 09/05/2014   tubular adenoma   COLONOSCOPY WITH PROPOFOL  N/A 09/15/2022   Multiple 3-25 mm polyps in ascending colon and cecum, markedly elongated and redundant colon. One polyp removed piecemeal fashion. 6 month surveillance recommended. Tubular adenomas.   COLONOSCOPY WITH PROPOFOL  N/A 02/23/2023   Procedure: COLONOSCOPY WITH PROPOFOL ;  Surgeon: Shaaron Lamar HERO, MD;  Location: [facility] ENDO SU[facility];  Service: Endoscopy;  Laterality: N/A;  730am, asa 3   CYSTOSCOPY WITH STENT PLACEMENT Right 01/27/2016   Procedure: CYSTOSCOPY WITH STENT PLACEMENT;  Surgeon: Garnette Shack, MD;  Location: [facility] ORS;  Service: Urology;  Laterality: Right;   CYSTOSCOPY/RETROGRADE/URETEROSCOPY/STONE EXTRACTION WITH BASKET Right 01/27/2016   Procedure: CYSTOSCOPY, RIGHT RETROGRADE, RIGHT URETEROSCOPY, STONE EXTRACTION ;  Surgeon: Garnette Shack, MD;  Location: [facility] ORS;  Service: Urology;  Laterality: Right;   ESOPHAGOGASTRODUODENOSCOPY  10/15/2008   Dr Rourk:Schatzki's ring status post dilation and disruption via 15 F Maloney dilator/ otherwise unremarkable esophagus, small hiatal hernia, multiple fundal gland polyps not manipulated, gastritis, negative H.pylori  ESOPHAGOGASTRODUODENOSCOPY  06/21/2002   [facility]:Dfjoo sliding hiatal hernia with mild changes of reflux esophagitis limited to gastroesophageal junction.  Noncritical ring at distal esophagus, 3 cm proximal to gastroesophageal junction/Antral gastritis   FACIAL COSMETIC SURGERY  ` 1985   broke face playing softball   FLEXIBLE SIGMOIDOSCOPY N/A 07/28/2022   Procedure: FLEXIBLE SIGMOIDOSCOPY;  Surgeon: Shaaron Lamar HERO, MD;  [facility];   Service: Endoscopy;  Laterality: N/A;   FRACTURE SURGERY     left wrist; broke it; took spur off   HEMOSTASIS CLIP PLACEMENT  02/23/2023   Procedure: HEMOSTASIS CLIP PLACEMENT;  Surgeon: Shaaron Lamar HERO, MD;  [facility];  Service: Endoscopy;;   HOLMIUM LASER APPLICATION Right 01/27/2016   Procedure: HOLMIUM LASER APPLICATION;  Surgeon: Garnette Shack, MD;  Location: [facility] ORS;  Service: Urology;  Laterality: Right;   KNEE ARTHROSCOPY Right 05/18/2018   Procedure: PARTIAL MEDIAL MENISECTOMY AND SURGICAL LAVAGE AND CHONDROPLASTY;  Surgeon: Josefina Chew, MD;  Location: [facility] OR;  Service: Orthopedics;  Laterality: Right;   LEFT HEART CATHETERIZATION WITH CORONARY ANGIOGRAM N/A 12/27/2013   Procedure: LEFT HEART CATHETERIZATION WITH CORONARY ANGIOGRAM;  Surgeon: Peter M Jordan, MD;  Location: [facility] CATH LAB;  Service: Cardiovascular;  Laterality: N/A;   neck epidural     POLYPECTOMY  09/15/2022   Procedure: POLYPECTOMY;  Surgeon: Shaaron Lamar HERO, MD;  [facility];  Service: Endoscopy;;   POLYPECTOMY  02/23/2023   Procedure: POLYPECTOMY INTESTINAL;  Surgeon: Shaaron Lamar HERO, MD;  [facility];  Service: Endoscopy;;   POSTERIOR LUMBAR FUSION 4 LEVEL N/A 03/28/2018   Procedure: Thoracic eight -Lumbar two- FIXATION WITH SCREW PLACEMENT, DECOMPRESSION Thoracic ten-Thoracic eleven  FOR OSTEOMYELITIS;  Surgeon: Colon Shove, MD;  Location: [facility] OR;  Service: Neurosurgery;  Laterality: N/A;   SCLEROTHERAPY  09/15/2022   Procedure: SCLEROTHERAPY;  Surgeon: Shaaron Lamar HERO, MD;  [facility];  Service: Endoscopy;;   SHOULDER SURGERY Bilateral    SPINAL CORD STIMULATOR REMOVAL N/A 02/25/2020   Procedure: LUMBAR SPINAL CORD STIMULATOR REMOVAL;  Surgeon: Colon Shove, MD;  Location: [facility] OR;  Service: Neurosurgery;  Laterality: N/A;   TEE WITHOUT CARDIOVERSION N/A 03/21/2018   Procedure: TRANSESOPHAGEAL ECHOCARDIOGRAM (TEE) WITH PROPOFOL ;  Surgeon: Debera Jayson MATSU,  MD;  [facility];  Service: Cardiovascular;  Laterality: N/A;    Allergies: Lisinopril   Medications: Prior to Admission medications  Medication Sig Start Date End Date Taking? Authorizing Provider  acetaminophen  (TYLENOL ) 325 MG tablet Take 2 tablets (650 mg total) by mouth every 6 (six) hours as needed for mild pain (pain score 1-3) (or Fever >/= 101). 10/06/23   Pearlean Manus, MD  alfuzosin  (UROXATRAL ) 10 MG 24 hr tablet Take 10 mg by mouth daily with breakfast.    [provider]  apixaban (ELIQUIS) 5 MG TABS tablet TAKE ONE TABLET BY MOUTH TWICE A DAY 08/29/24   Debera Jayson MATSU, MD  ARIPiprazole  (ABILIFY ) 5 MG tablet Take 5 mg by mouth daily.    [provider]  ascorbic acid (VITAMIN C) 500 MG tablet Take 500 mg by mouth 3 (three) times a week.    [provider]  bisoprolol -hydrochlorothiazide (ZIAC) 2.5-6.25 MG tablet Take 1 tablet every day by oral route.    [provider]  Cholecalciferol (VITAMIN D) 50 MCG (2000 UT) CAPS Take 2,000 Units by mouth daily.    [provider]  clopidogrel  (PLAVIX ) 75 MG tablet Take 1 tablet (75 mg total) by mouth daily.  04/08/21   Zackowski, Scott, MD  cyanocobalamin  (VITAMIN B12) 500 MCG tablet Take 500 mcg by mouth daily.    [provider]  diclofenac Sodium (VOLTAREN) 1 % GEL Apply topically 4 (four) times daily.    [provider]  finasteride  (PROSCAR ) 5 MG tablet Take 1 tablet (5 mg total) by mouth daily. Reported on 05/04/2016 05/22/18   Krishnan, Gokul, MD  fluticasone  (FLONASE ) 50 MCG/ACT nasal spray Place 1 spray into both nostrils daily as needed for allergies. 05/22/18   Krishnan, Gokul, MD  furosemide  (LASIX ) 20 MG tablet Take 1 tablet (20 mg total) by mouth daily. 12/11/24   Miriam Norris, NP  gabapentin  (NEURONTIN ) 100 MG capsule Take 100 mg by mouth at bedtime.    [provider]  hydroxyurea  (HYDREA ) 500 MG capsule Take 3 capsules (1,500 mg total) by  mouth every Monday, Wednesday, and Friday AND 2 capsules (1,000 mg total) every Tuesday, Thursday, Saturday, and Sunday. May take with food to minimize GI side effects. 08/28/24   Geofm Delon BRAVO, NP  linaclotide  (LINZESS ) 290 MCG CAPS capsule Take 290 mcg by mouth as needed.    [provider]  megestrol  (MEGACE ) 400 MG/10ML suspension Take 10 mLs (400 mg total) by mouth 2 (two) times daily. 04/11/24   Rogers Hai, MD  methocarbamol  (ROBAXIN ) 500 MG tablet Take 500 mg by mouth. 06/26/24   [provider]  metoprolol succinate (TOPROL-XL) 25 MG 24 hr tablet Take 12.5 mg by mouth daily. 07/13/24   [provider]  mupirocin cream (BACTROBAN) 2 % Apply topically.    [provider]  mupirocin ointment (BACTROBAN) 2 % Apply 1 Application topically daily. 09/21/23   [provider]  mupirocin ointment (BACTROBAN) 2 % Apply 1 Application topically daily. 10/15/24   [provider]  oxyCODONE -acetaminophen  (PERCOCET/ROXICET) 5-325 MG tablet Take by mouth. 07/13/24   [provider]  pantoprazole  (PROTONIX ) 40 MG tablet Take 1 tablet by mouth daily. 10/17/23   [provider]  potassium chloride  SA (KLOR-CON  M20) 20 MEQ tablet Take 1 tablet (20 mEq total) by mouth daily for 5 days, THEN 1 tablet (20 mEq total) daily as needed (When you take your lasix ). 08/03/24 12/28/24  Miriam Norris, NP  rosuvastatin  (CRESTOR ) 20 MG tablet Take 20 mg by mouth daily.    [provider]  sacubitril -valsartan  (ENTRESTO ) 97-103 MG Take 1 tablet by mouth 2 (two) times daily. 11/22/24   Miriam Norris, NP  senna-docusate (SENOKOT-S) 8.6-50 MG tablet  06/10/21   [provider]  silver  sulfADIAZINE  (SILVADENE ) 1 % cream Apply 1 Application topically daily.    [provider]  spironolactone  (ALDACTONE ) 25 MG tablet Take 25 mg by mouth daily. 07/20/23 12/28/24  [provider]  traZODone  (DESYREL ) 50 MG tablet Take 50 mg by  mouth at bedtime. 06/26/24   [provider]  venlafaxine  XR (EFFEXOR -XR) 150 MG 24 hr capsule Take 1 capsule (150 mg total) by mouth 2 (two) times daily. 05/22/18   Verdene Purchase, MD     Family History  Problem Relation Age of Onset   Emphysema Father    Heart failure Father    Lung cancer Father    CAD Father    Colon cancer Mother    Stroke Mother    Breast cancer Mother    Stroke Sister    Heart attack Brother    Dementia Paternal Uncle    Emphysema Maternal Grandmother    Stroke Maternal Grandmother  Asthma Other        grandson   Heart disease Paternal Grandfather    Anesthesia problems Neg Hx    Hypotension Neg Hx    Malignant hyperthermia Neg Hx    Pseudochol deficiency Neg Hx     Social History   Socioeconomic History   Marital status: Widowed    Spouse name: Rojelio   Number of children: 3   Years of education: Not on file   Highest education level: Not on file  Occupational History   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[employer])    Employer: [employer]  Tobacco Use   Smoking status: Former    Current packs/day: 0.00    Types: Cigarettes    Quit date: 12/20/1958    Years since quitting: 66.0   Smokeless tobacco: Current    Types: Chew  Vaping Use   Vaping status: Never Used  Substance and Sexual Activity   Alcohol use: Not Currently    Alcohol/week: 0.0 standard drinks of alcohol    Comment: couple bottles of wine per month   Drug use: No    Comment: last used marijuana ~ 1969   Sexual activity: Not on file  Other Topics Concern   Not on file  Social History Narrative   Married, 3 daughters; retired   Patient drinks about 1 cup of coffee daily.   Patient is left handed.   Social Drivers of Health   Tobacco Use: High Risk (01/04/2025)   Patient History    Smoking Tobacco Use: Former    Smokeless Tobacco Use: Current    Passive Exposure: Not on file  Financial Resource Strain: Low Risk (07/14/2023)   Received from [facility]   Overall Financial Resource Strain  (CARDIA)    Difficulty of Paying Living Expenses: Not very hard  Food Insecurity: No Food Insecurity (06/01/2024)   Received from [facility]   Epic    Within the past 12 months, you worried that your food would run out before you got the money to buy more.: Never true    Within the past 12 months, the food you bought just didn't last and you didn't have money to get more.: Never true  Transportation Needs: No Transportation Needs (06/01/2024)   Received from [facility]   Epic    In the past 12 months, has lack of transportation kept you from medical appointments or from getting medications?: No    In the past 12 months, has lack of transportation kept you from meetings, work, or from getting things needed for daily living?: No  Physical Activity: Not on file  Stress: Not on file  Social Connections: Unknown (05/03/2022)   Received from [facility]   Social Network    Social Network: Not on file  Depression (PHQ2-9): Low Risk (11/06/2024)   Depression (PHQ2-9)    PHQ-2 Score: 0  Alcohol Screen: Not on file  Housing: Low Risk (06/01/2024)   Received from [facility]   Epic    In the last 12 months, was there a time when you were not able to pay the mortgage or rent on time?: No    In the past 12 months, how many times have you moved where you were living?: 0    At any time in the past 12 months, were you homeless or living in a shelter (including now)?: No  Utilities: Not At Risk (06/01/2024)   Received from [facility]   Epic    In the past  12 months has the electric, gas, oil, or water  company threatened to shut off services in your home?: No  Health Literacy: Not on file     Review of Systems: A 12 point ROS discussed and pertinent positives are indicated in the HPI above.  All other systems are negative.  Review of Systems  Constitutional:  Negative for fatigue and fever.  Respiratory:  Negative for cough and shortness of  breath.   Cardiovascular:  Negative for chest pain.  Gastrointestinal:  Negative for abdominal pain, nausea and vomiting.  Musculoskeletal:  Negative for back pain.  Psychiatric/Behavioral:  Negative for behavioral problems and confusion.     Vital Signs: There were no vitals taken for this visit.  Physical Exam Vitals and nursing note reviewed.  Constitutional:      General: He is not in acute distress.    Appearance: Normal appearance. He is not ill-appearing.  Cardiovascular:     Rate and Rhythm: Normal rate.  Pulmonary:     Effort: Pulmonary effort is normal.  Abdominal:     General: There is no distension.     Palpations: Abdomen is soft.  Skin:    General: Skin is warm and dry.  Neurological:     General: No focal deficit present.     Mental Status: He is alert and oriented to person, place, and time. Mental status is at baseline.  Psychiatric:        Mood and Affect: Mood normal.        Behavior: Behavior normal.        Thought Content: Thought content normal.        Judgment: Judgment normal.      MD Evaluation Airway: WNL Heart: WNL Abdomen: WNL Chest/ Lungs: WNL ASA  Classification: 3 Mallampati/Airway Score: Two   Imaging: No results found.  Labs:  CBC: Recent Labs    06/18/24 1345 07/17/24 0926 08/28/24 1317 11/06/24 0929  WBC 12.3* 10.1 11.1* 10.1  HGB 17.9* 16.2 16.1 15.6  HCT 59.7* 54.8* 52.2* 48.8  PLT 794* 452* 428* 424*    COAGS: No results for input(s): INR, APTT in the last 8760 hours.  BMP: Recent Labs    06/18/24 1345 07/17/24 0926 08/28/24 1317 11/06/24 0929  NA 138 140 139 139  K 4.1 3.3* 4.2 4.1  CL 105 107 104 104  CO2 20* 25 25 26   GLUCOSE 97 91 102* 107*  BUN 12 8 10 10   CALCIUM  9.3 8.9 9.0 9.2  CREATININE 0.74 0.84 1.03 0.91  GFRNONAA >60 >60 >60 >60    LIVER FUNCTION TESTS: Recent Labs    06/18/24 1345 07/17/24 0926 08/28/24 1317 11/06/24 0929  BILITOT 0.9 0.9 0.6 0.5  AST 18 15 16 17   ALT  17 13 12 8   ALKPHOS 86 80 73 75  PROT 7.7 6.9 7.2 7.4  ALBUMIN  3.5 3.2* 3.5 3.9    TUMOR MARKERS: No results for input(s): AFPTM, CEA, CA199, CHROMGRNA in the last 8760 hours.  Assessment and Plan: Patient with past medical history of pulmonary embolus necessitating IVC filter placement presents after transition to oral AC with tolerance.  IR consulted for IVC filter retreival at the request of Delon Hope, NP. Case reviewed by Dr. Jenna who approves patient for procedure after consultation 10/2024.  Patient presents Velez in their usual state of health.  He has been NPO and is not currently on blood thinners.   Risks and benefits discussed with the patient including, but not limited to  bleeding, infection, contrast induced renal failure, filter fracture or migration which can lead to emergency surgery or even death, strut penetration with damage or irritation to adjacent structures and caval thrombosis.  All of the patient's questions were answered, patient is agreeable to proceed. Consent signed and in chart.   Thank you for this interesting consult.  I greatly enjoyed meeting Cody Velez and look forward to participating in their care.  A copy of this report was sent to the requesting provider on this date.  Electronically Signed: Cardale Dorer Sue-Ellen Oluwademilade Mckiver, PA 01/04/2025, 7:49 AM   I spent a total of   25 Minutes in face to face in clinical consultation, greater than 50% of which was counseling/coordinating care for pulmonary embolus.   "
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
## 2025-01-04 ENCOUNTER — Other Ambulatory Visit: Payer: Self-pay

## 2025-01-04 ENCOUNTER — Encounter (HOSPITAL_COMMUNITY): Payer: Self-pay

## 2025-01-04 ENCOUNTER — Ambulatory Visit (HOSPITAL_COMMUNITY)
Admission: RE | Admit: 2025-01-04 | Discharge: 2025-01-04 | Disposition: A | Discharge status: Home / Self Care | Source: Ambulatory Visit | Attending: Oncology | Admitting: Oncology

## 2025-01-04 DIAGNOSIS — Z8673 Personal history of transient ischemic attack (TIA), and cerebral infarction without residual deficits: Secondary | ICD-10-CM | POA: Insufficient documentation

## 2025-01-04 DIAGNOSIS — Z7901 Long term (current) use of anticoagulants: Secondary | ICD-10-CM | POA: Insufficient documentation

## 2025-01-04 DIAGNOSIS — Z86718 Personal history of other venous thrombosis and embolism: Secondary | ICD-10-CM | POA: Diagnosis not present

## 2025-01-04 DIAGNOSIS — I824Z3 Acute embolism and thrombosis of unspecified deep veins of distal lower extremity, bilateral: Secondary | ICD-10-CM

## 2025-01-04 DIAGNOSIS — Z4689 Encounter for fitting and adjustment of other specified devices: Secondary | ICD-10-CM | POA: Diagnosis present

## 2025-01-04 DIAGNOSIS — Z8679 Personal history of other diseases of the circulatory system: Secondary | ICD-10-CM

## 2025-01-04 DIAGNOSIS — Z86711 Personal history of pulmonary embolism: Secondary | ICD-10-CM | POA: Diagnosis not present

## 2025-01-04 DIAGNOSIS — I2690 Septic pulmonary embolism without acute cor pulmonale: Secondary | ICD-10-CM

## 2025-01-04 HISTORY — PX: IR IVC FILTER RETRIEVAL / S&I /IMG GUID/MOD SED: IMG5308

## 2025-01-04 MED ORDER — MIDAZOLAM HCL (PF) 2 MG/2ML IJ SOLN
INTRAMUSCULAR | Status: AC | PRN
  Administered 2025-01-04 (×2): 1 mg via INTRAVENOUS

## 2025-01-04 MED ORDER — IOHEXOL 300 MG/ML  SOLN
100.0000 mL | Freq: Once | INTRAMUSCULAR | Status: AC | PRN
  Administered 2025-01-04: 20 mL via INTRA_ARTERIAL

## 2025-01-04 MED ORDER — LIDOCAINE-EPINEPHRINE 1 %-1:100000 IJ SOLN
20.0000 mL | Freq: Once | INTRAMUSCULAR | Status: AC
  Administered 2025-01-04: 10 mL

## 2025-01-04 MED ORDER — FENTANYL CITRATE (PF) 100 MCG/2ML IJ SOLN
INTRAMUSCULAR | Status: AC | PRN
  Administered 2025-01-04 (×2): 25 ug via INTRAVENOUS

## 2025-01-04 MED ORDER — LIDOCAINE-EPINEPHRINE 1 %-1:100000 IJ SOLN
INTRAMUSCULAR | Status: AC
  Filled 2025-01-04: qty 1

## 2025-01-04 MED ORDER — SODIUM CHLORIDE 0.9 % IV SOLN: INTRAVENOUS | Status: DC

## 2025-01-04 MED ORDER — MIDAZOLAM HCL 2 MG/2ML IJ SOLN
INTRAMUSCULAR | Status: AC
  Filled 2025-01-04: qty 4

## 2025-01-04 MED ORDER — FENTANYL CITRATE (PF) 100 MCG/2ML IJ SOLN
INTRAMUSCULAR | Status: AC
  Filled 2025-01-04: qty 2

## 2025-01-04 NOTE — Procedures (Signed)
 Interventional Radiology Procedure Note  Procedure: Fluoro guided IVC retrieval  Complications: None  Estimated Blood Loss: < 10 mL  Findings: Fluoro guided retrieval of IVC Denali filter.  Cody DELENA Banner, MD

## 2025-01-08 ENCOUNTER — Ambulatory Visit (HOSPITAL_COMMUNITY): Admitting: Physical Therapy

## 2025-01-08 ENCOUNTER — Telehealth: Payer: Self-pay | Admitting: Neurology

## 2025-01-08 DIAGNOSIS — R262 Difficulty in walking, not elsewhere classified: Secondary | ICD-10-CM

## 2025-01-08 DIAGNOSIS — R2689 Other abnormalities of gait and mobility: Secondary | ICD-10-CM

## 2025-01-08 DIAGNOSIS — M6281 Muscle weakness (generalized): Secondary | ICD-10-CM

## 2025-01-08 DIAGNOSIS — M25551 Pain in right hip: Secondary | ICD-10-CM
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
## 2025-01-08 NOTE — Telephone Encounter (Signed)
 Pt called to Cancel appt due  to not needing  appt

## 2025-01-10 ENCOUNTER — Ambulatory Visit (HOSPITAL_COMMUNITY)

## 2025-01-10 DIAGNOSIS — R262 Difficulty in walking, not elsewhere classified: Secondary | ICD-10-CM

## 2025-01-10 DIAGNOSIS — M6281 Muscle weakness (generalized): Secondary | ICD-10-CM

## 2025-01-10 DIAGNOSIS — R2689 Other abnormalities of gait and mobility: Secondary | ICD-10-CM
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
## 2025-01-14 ENCOUNTER — Ambulatory Visit: Admitting: Neurology

## 2025-01-14 ENCOUNTER — Ambulatory Visit (HOSPITAL_COMMUNITY)

## 2025-01-16 ENCOUNTER — Ambulatory Visit (HOSPITAL_COMMUNITY)

## 2025-01-22 ENCOUNTER — Ambulatory Visit (HOSPITAL_COMMUNITY): Admitting: Physical Therapy

## 2025-01-24 ENCOUNTER — Ambulatory Visit (HOSPITAL_COMMUNITY)

## 2025-01-29 ENCOUNTER — Ambulatory Visit (HOSPITAL_COMMUNITY)

## 2025-02-19 ENCOUNTER — Inpatient Hospital Stay: Attending: Hematology

## 2025-02-19 ENCOUNTER — Inpatient Hospital Stay: Admitting: Oncology

## 2025-03-12 ENCOUNTER — Ambulatory Visit: Admitting: Urology

## 2025-03-26 ENCOUNTER — Ambulatory Visit: Admitting: Nurse Practitioner
